# Patient Record
Sex: Male | Born: 1965 | Race: White | Hispanic: No | Smoking: Never smoker
Health system: Southern US, Community
[De-identification: ages and names within clinical notes are randomized; demographics above are authoritative.]

## PROBLEM LIST (undated history)

## (undated) DIAGNOSIS — G931 Anoxic brain damage, not elsewhere classified: Secondary | ICD-10-CM

## (undated) DIAGNOSIS — I219 Acute myocardial infarction, unspecified: Secondary | ICD-10-CM

## (undated) DIAGNOSIS — J690 Pneumonitis due to inhalation of food and vomit: Secondary | ICD-10-CM

## (undated) DIAGNOSIS — E271 Primary adrenocortical insufficiency: Secondary | ICD-10-CM

## (undated) DIAGNOSIS — F070 Personality change due to known physiological condition: Secondary | ICD-10-CM

## (undated) DIAGNOSIS — I4891 Unspecified atrial fibrillation: Secondary | ICD-10-CM

## (undated) DIAGNOSIS — E785 Hyperlipidemia, unspecified: Secondary | ICD-10-CM

## (undated) DIAGNOSIS — Z87440 Personal history of urinary (tract) infections: Secondary | ICD-10-CM

## (undated) DIAGNOSIS — IMO0001 Reserved for inherently not codable concepts without codable children: Secondary | ICD-10-CM

## (undated) DIAGNOSIS — F0390 Unspecified dementia without behavioral disturbance: Secondary | ICD-10-CM

## (undated) DIAGNOSIS — G40909 Epilepsy, unspecified, not intractable, without status epilepticus: Secondary | ICD-10-CM

## (undated) DIAGNOSIS — G8250 Quadriplegia, unspecified: Secondary | ICD-10-CM

## (undated) DIAGNOSIS — E039 Hypothyroidism, unspecified: Secondary | ICD-10-CM

## (undated) DIAGNOSIS — K668 Other specified disorders of peritoneum: Secondary | ICD-10-CM

## (undated) DIAGNOSIS — G9340 Encephalopathy, unspecified: Secondary | ICD-10-CM

## (undated) DIAGNOSIS — R29898 Other symptoms and signs involving the musculoskeletal system: Secondary | ICD-10-CM

## (undated) DIAGNOSIS — K5909 Other constipation: Secondary | ICD-10-CM

## (undated) DIAGNOSIS — R269 Unspecified abnormalities of gait and mobility: Secondary | ICD-10-CM

## (undated) DIAGNOSIS — F419 Anxiety disorder, unspecified: Secondary | ICD-10-CM

## (undated) DIAGNOSIS — E2749 Other adrenocortical insufficiency: Secondary | ICD-10-CM

## (undated) DIAGNOSIS — K219 Gastro-esophageal reflux disease without esophagitis: Secondary | ICD-10-CM

## (undated) DIAGNOSIS — F99 Mental disorder, not otherwise specified: Secondary | ICD-10-CM

## (undated) DIAGNOSIS — I252 Old myocardial infarction: Secondary | ICD-10-CM

## (undated) HISTORY — DX: Other specified disorders of peritoneum: K66.8

## (undated) HISTORY — DX: Unspecified abnormalities of gait and mobility: R26.9

## (undated) HISTORY — DX: Personality change due to known physiological condition: F07.0

## (undated) HISTORY — PX: PEG TUBE PLACEMENT: SUR1034

## (undated) HISTORY — DX: Old myocardial infarction: I25.2

## (undated) HISTORY — DX: Other constipation: K59.09

## (undated) HISTORY — PX: NEPHRECTOMY: SHX65

## (undated) HISTORY — DX: Other symptoms and signs involving the musculoskeletal system: R29.898

---

## 2009-08-16 ENCOUNTER — Emergency Department: Payer: Self-pay | Admitting: Emergency Medicine

## 2009-08-31 ENCOUNTER — Inpatient Hospital Stay: Payer: Self-pay | Admitting: Internal Medicine

## 2009-09-18 IMAGING — CR DG CHEST 1V PORT
1 series · 1 of 1 positions shown · non-contrast
Comparison: none

REASON FOR EXAM: trach out
COMMENTS:

[view not recorded]
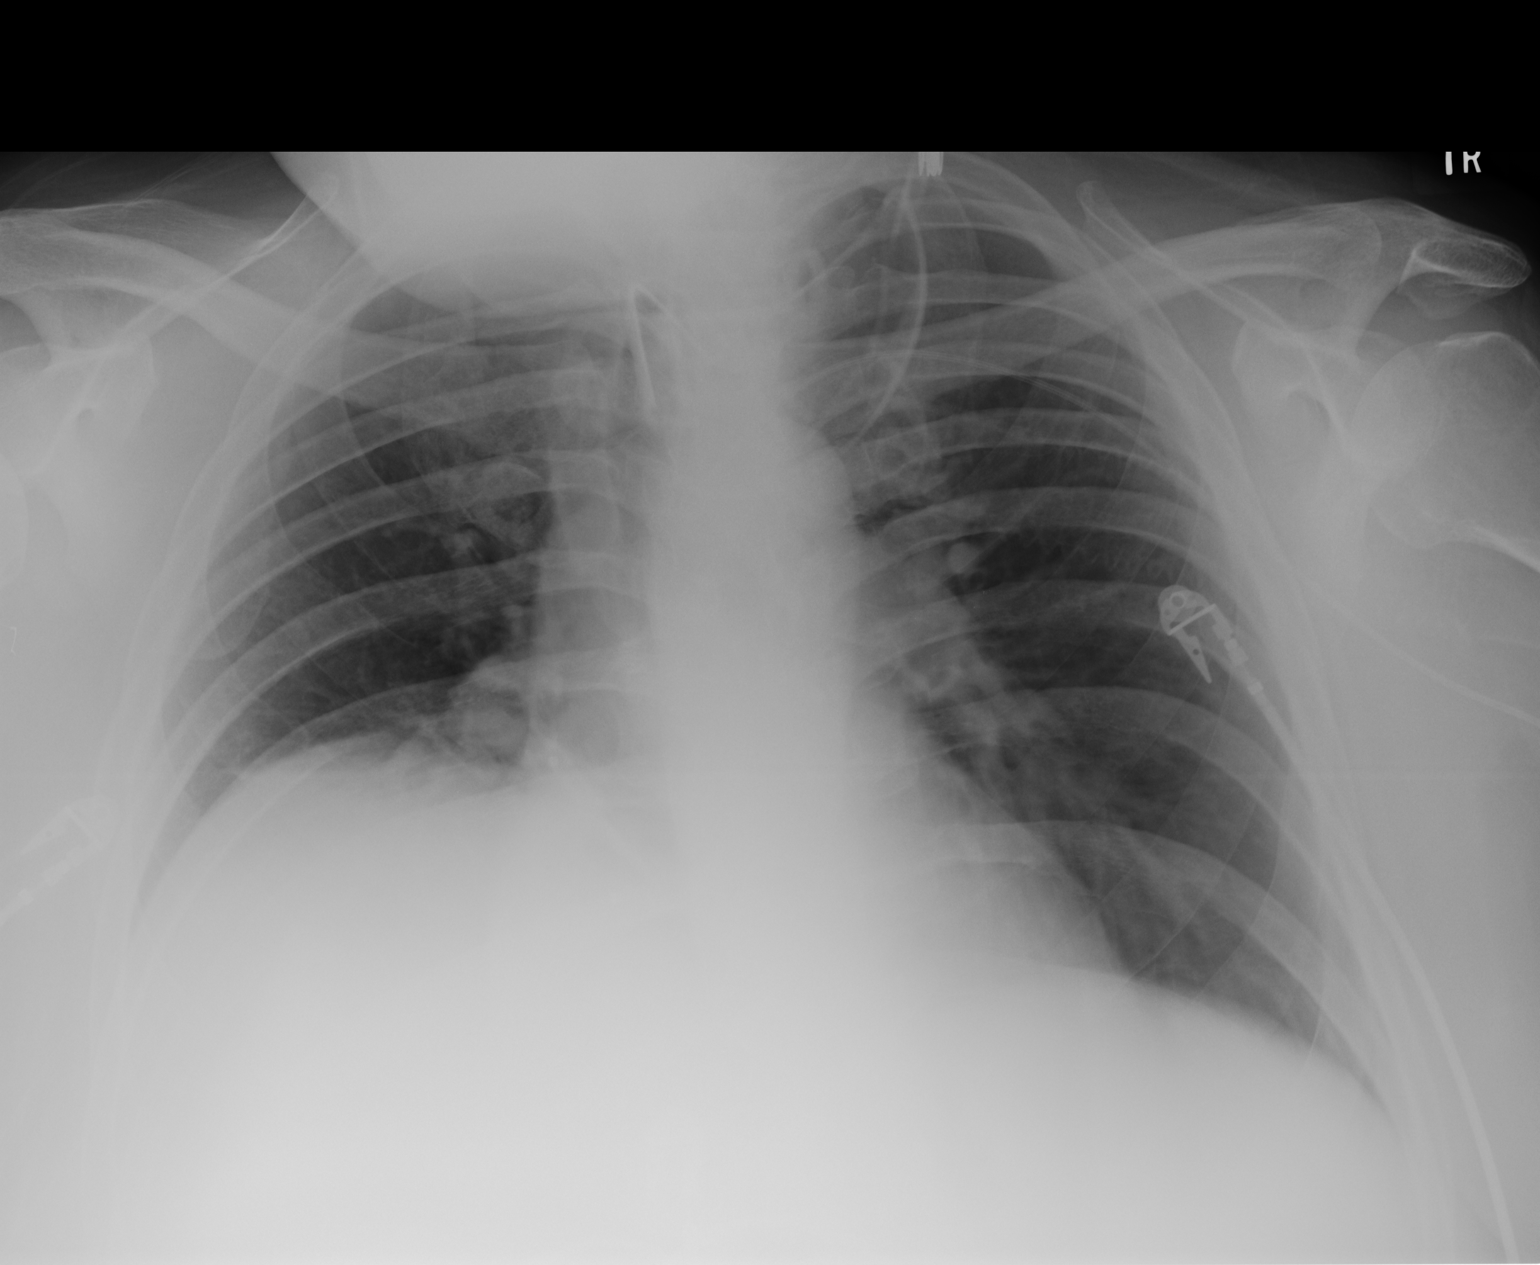

[1 of 1 positions shown; findings below may reference images not displayed]

PROCEDURE:     DXR - DXR PORTABLE CHEST SINGLE VIEW  - August 16, 2009  [DATE]

RESULT:     The left lung is reasonably well inflated and grossly clear. On
the right the hemidiaphragm appears elevated in results in volume loss. The
cardiac silhouette is not enlarged. The pulmonary vascularity is not
engorged. The tracheostomy is not well demonstrated.
IMPRESSION: I see no focal pneumonia nor definite atelectasis. The
right hemidiaphragm appears elevated. The left lung appears clear.

## 2009-10-03 IMAGING — CT CT HEAD WITHOUT CONTRAST
2 series · 16 of 30 positions shown, 20 images · non-contrast
Comparison: none

REASON FOR EXAM: altered mentlal  status  , pt on coumadin
COMMENTS:

PROCEDURE:     CT  - CT HEAD WITHOUT CONTRAST  - August 31, 2009 [DATE]
RESULT:     Comparison:  None
TECHNIQUE: Multiple axial images from the foramen magnum to the vertex were
obtained without IV contrast.

[Series 2: without · axial · non-contrast · 0.48mm/px · z∈[+192,+337]mm · 13 of 35 slices shown, 17 images]
[im 3/35  brain]
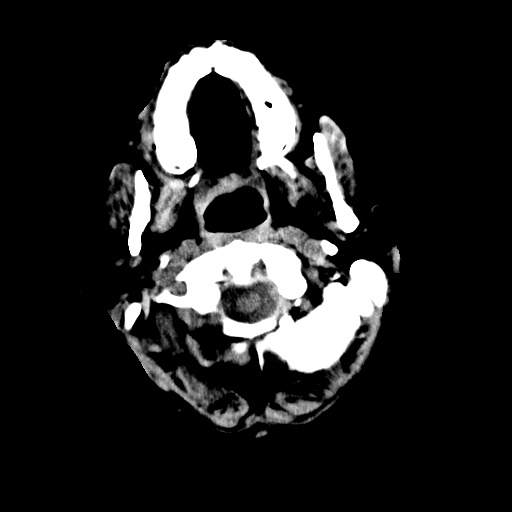
[im 3/35  bone]
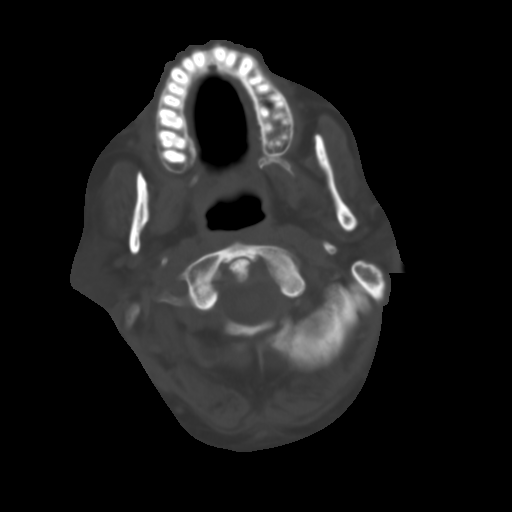
[im 5/35  brain]
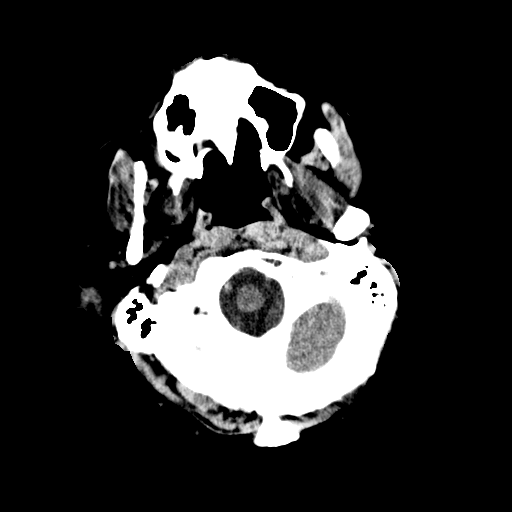
[im 8/35  brain]
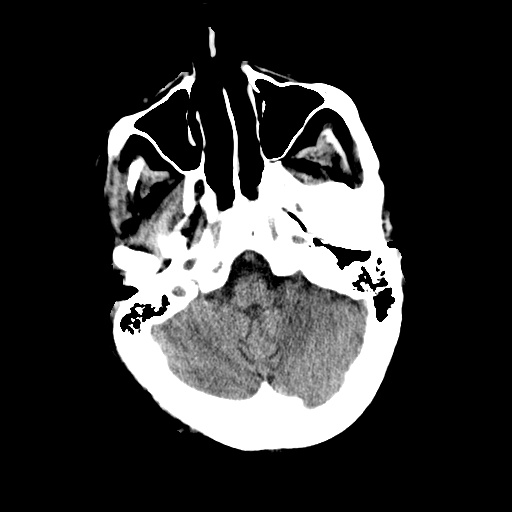
[im 10/35  brain]
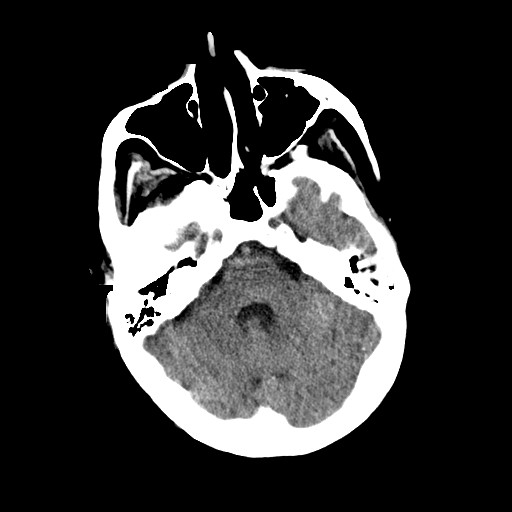
[im 13/35  brain]
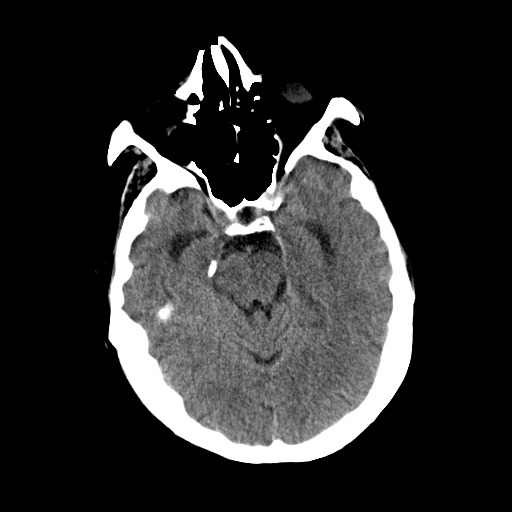
[im 13/35  bone]
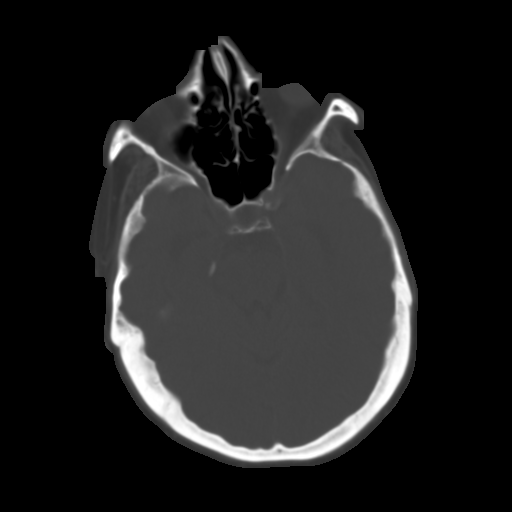
[im 15/35  brain]
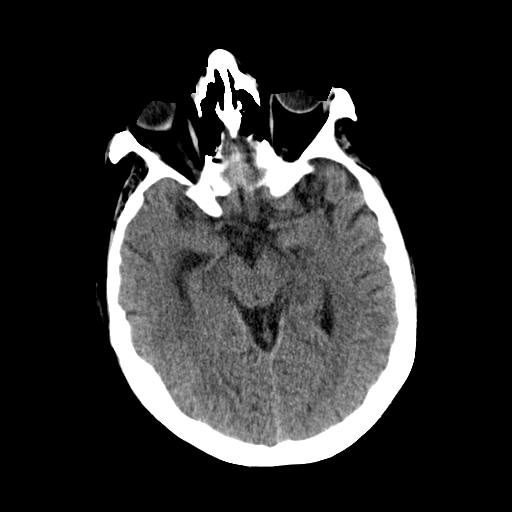
[im 18/35  brain]
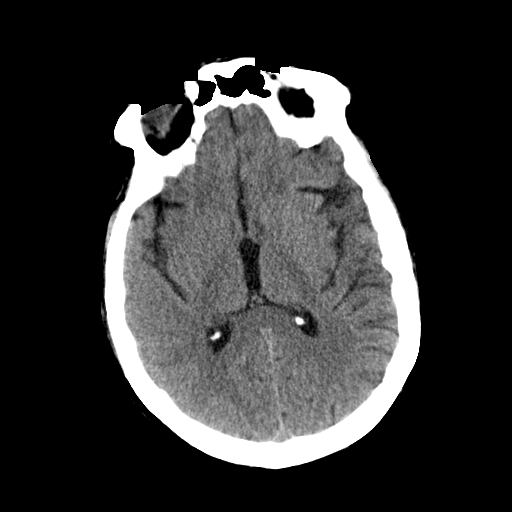
[im 20/35  brain]
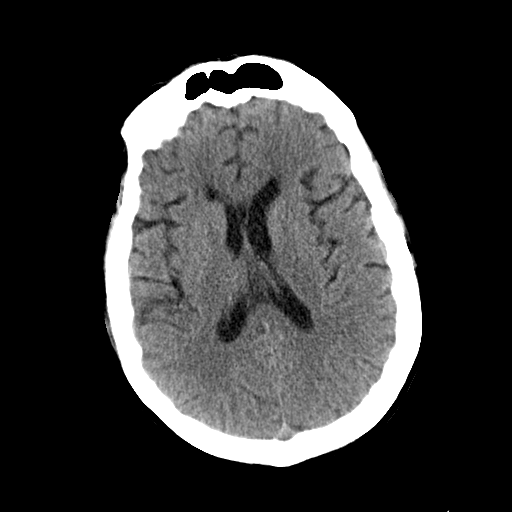
[im 22/35  brain]
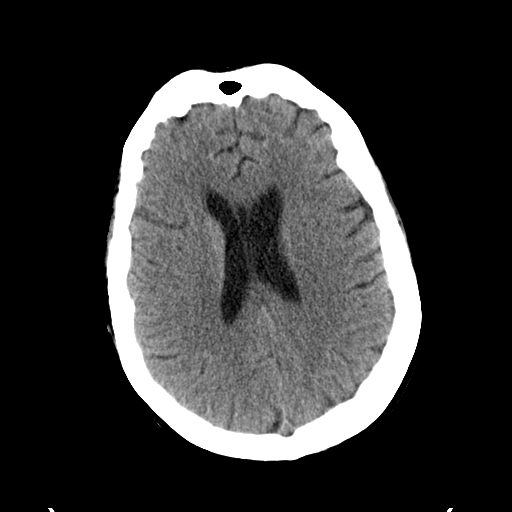
[im 22/35  bone]
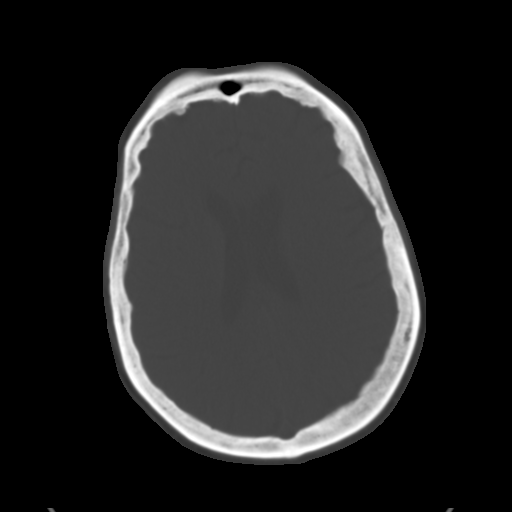
[im 25/35  brain]
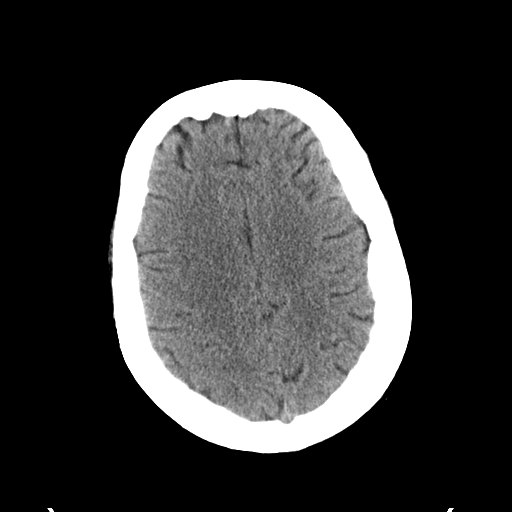
[im 27/35  brain]
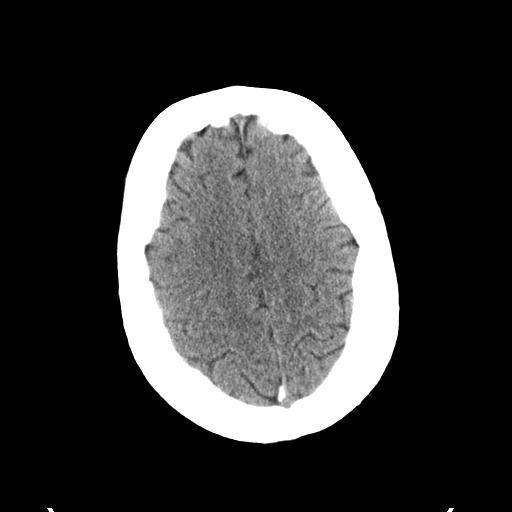
[im 30/35  brain]
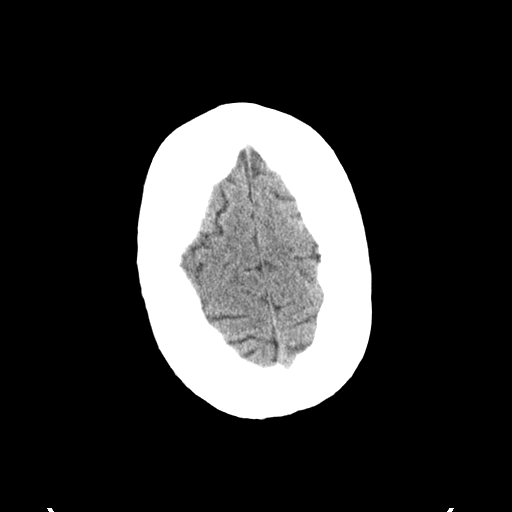
[im 32/35  brain]
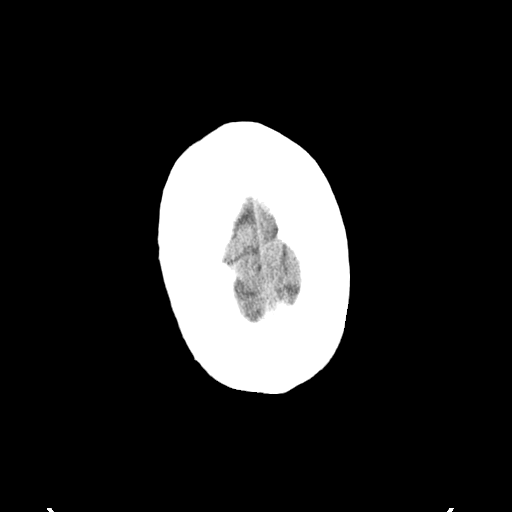
[im 32/35  bone]
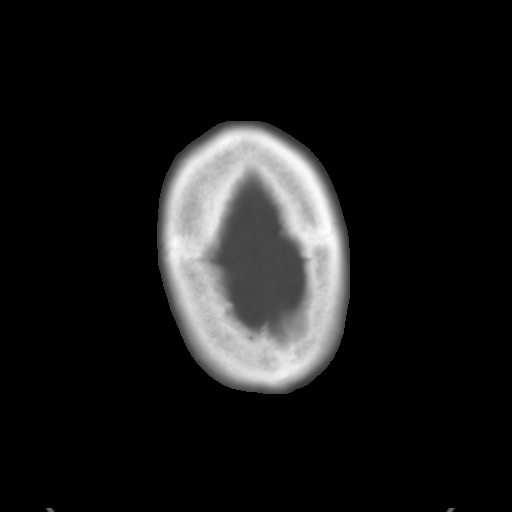

[Series 3: bone · axial · 0.48mm/px · z∈[+192,+242]mm · 3 of 35 slices shown]
[im 3/35  bone]
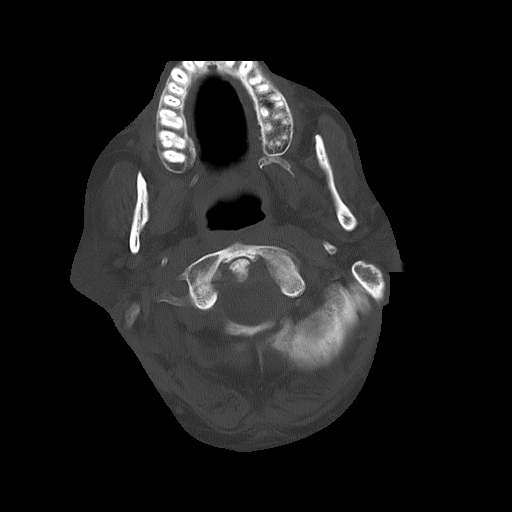
[im 8/35  bone]
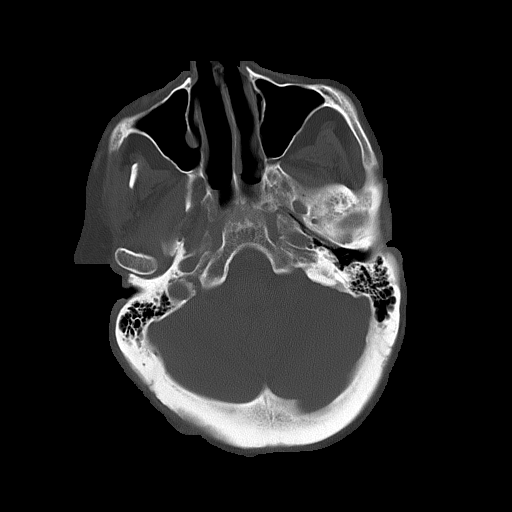
[im 13/35  bone]
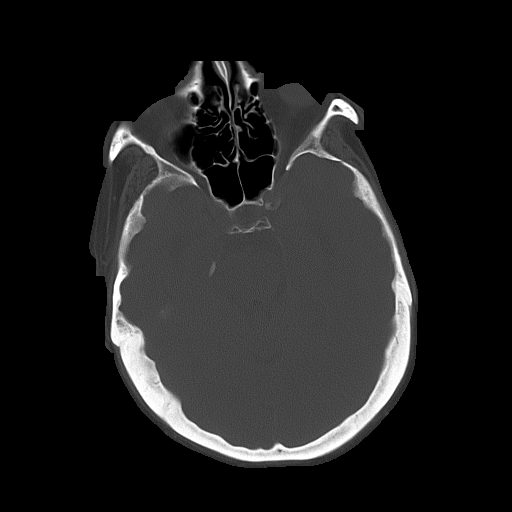

[16 of 30 positions shown; findings below may reference images not displayed]

FINDINGS: There is no evidence of mass effect, midline shift, or extra-axial fluid
collections.  There is no evidence of a space-occupying lesion or
intracranial hemorrhage. There is no evidence of a cortical-based area of
acute infarction.

The ventricles and sulci are appropriate for the patient's age. The basal
cisterns are patent.

Visualized portions of the orbits are unremarkable. The visualized portions
of the paranasal sinuses and mastoid air cells are unremarkable.

The osseous structures are unremarkable.
IMPRESSION: No acute intracranial process.

## 2009-10-03 IMAGING — CR DG CHEST 1V PORT
1 series · 1 of 1 positions shown · non-contrast
Comparison: none

REASON FOR EXAM: fever
COMMENTS:

PROCEDURE:     DXR - DXR PORTABLE CHEST SINGLE VIEW  - August 31, 2009  [DATE]
RESULT:     Comparison: 08/16/2009

[view not recorded]
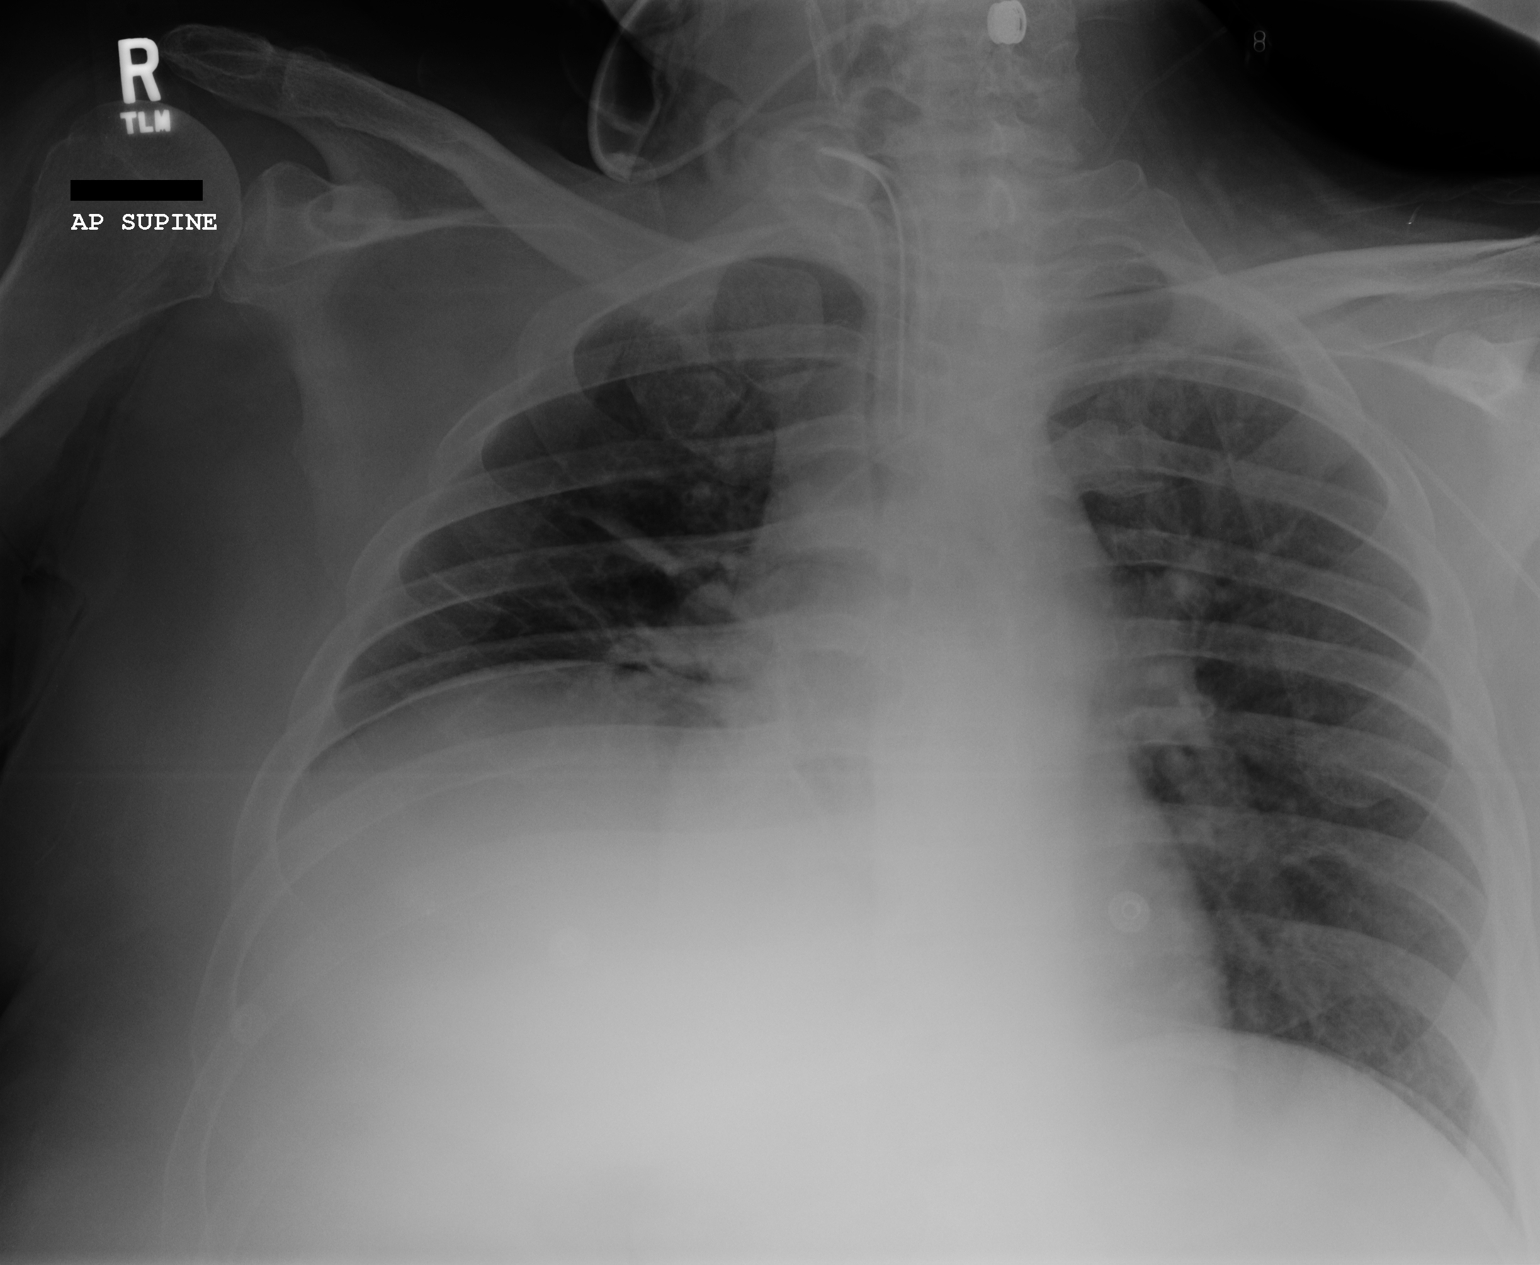

[1 of 1 positions shown; findings below may reference images not displayed]

FINDINGS: Single portable AP chest radiograph is provided. There is elevation of the
right hemidiaphragm. There is a tracheostomy tube in place. There is no
focal parenchymal opacity, pleural effusion, or pneumothorax. Normal
cardiomediastinal silhouette. The osseous structures are unremarkable.
IMPRESSION: No acute disease of the chest.

Persistent elevated right hemidiaphragm.

## 2009-10-06 IMAGING — CR DG CHEST 1V PORT
1 series · 1 of 1 positions shown · non-contrast
Comparison: none

REASON FOR EXAM: positive sputum culture
COMMENTS:

[view not recorded]
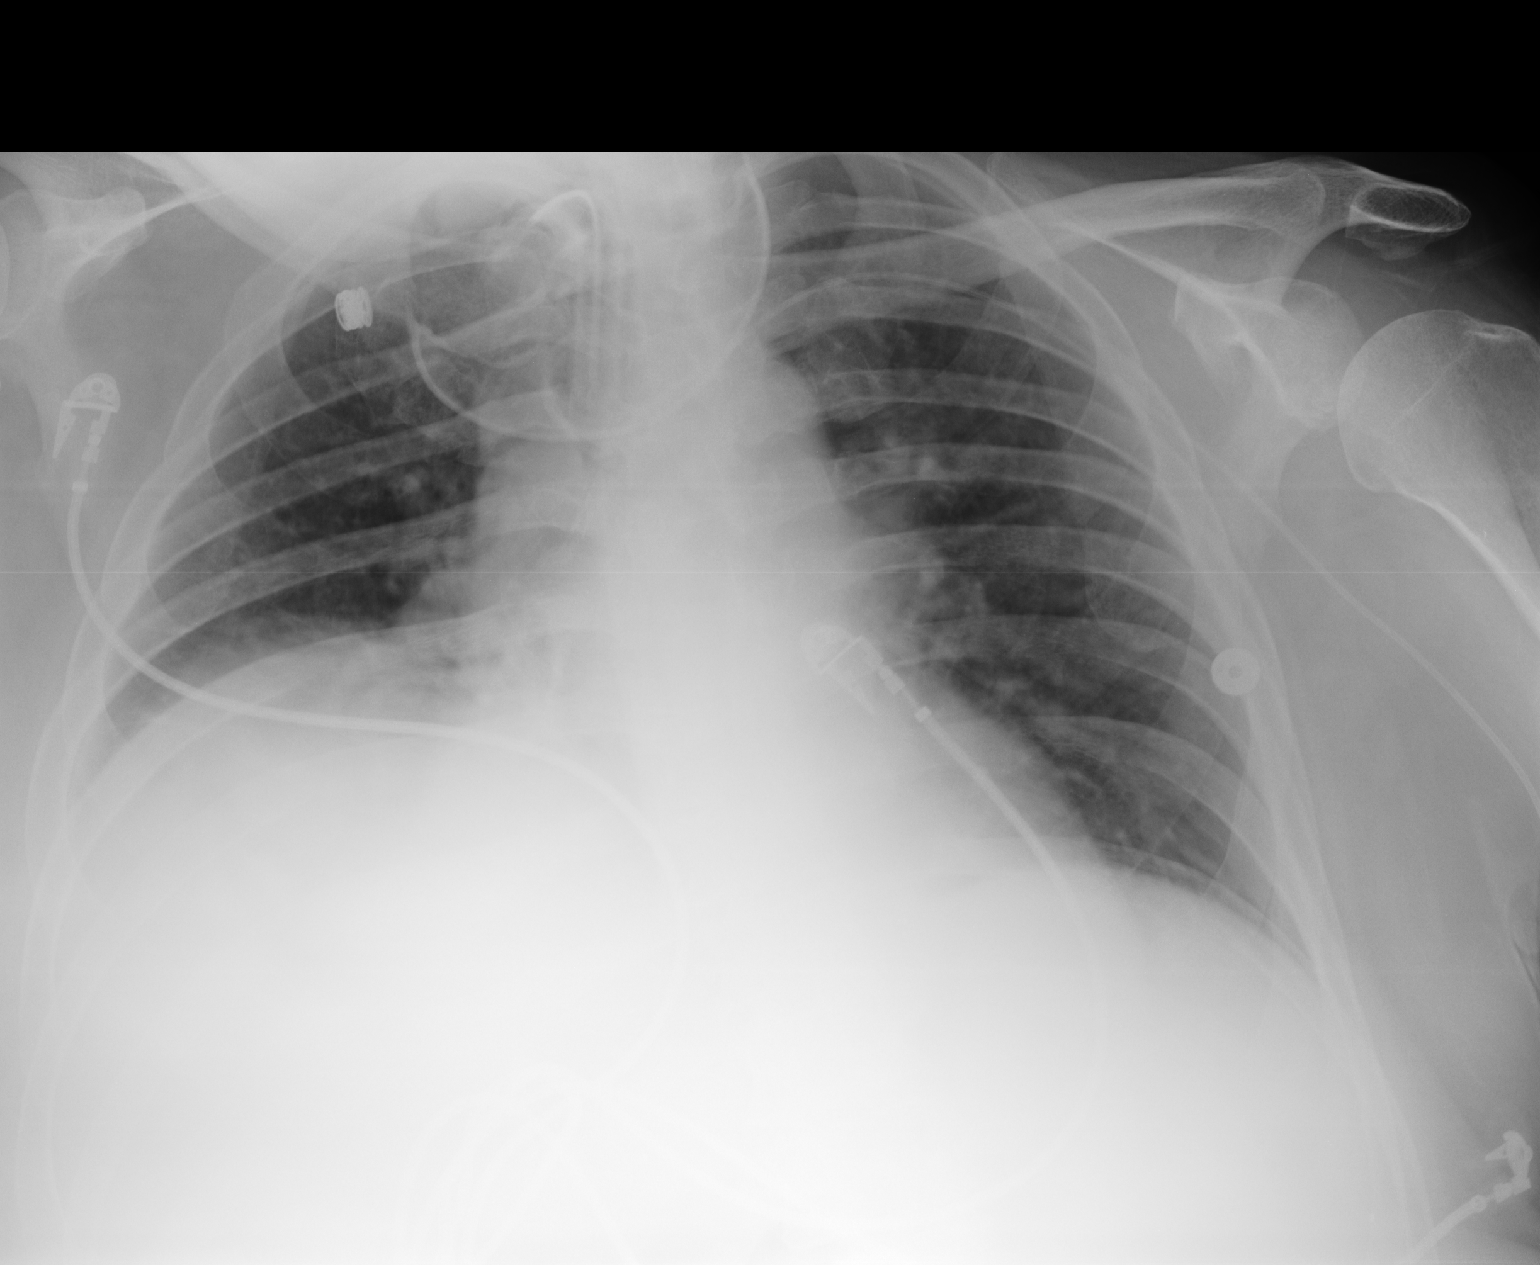

[1 of 1 positions shown; findings below may reference images not displayed]

PROCEDURE:     DXR - DXR PORTABLE CHEST SINGLE VIEW  - September 03, 2009  [DATE]

RESULT:     Comparison is made to a prior study of 08/31/2009. Elevation of
the right hemidiaphragm is again noted. Endotracheal tube is noted in good
anatomic position. Mild atelectasis and/or infiltrate of the right lung base
on today's exam. The left lung is clear. The cardiovascular structures are
unremarkable.
IMPRESSION: 1.     Mild atelectasis and/or infiltrate right lung base.
2.     Persistent elevation of the right hemidiaphragm.
3.     Endotracheal tube in good position.

## 2009-10-09 IMAGING — CR DG CHEST 1V PORT
1 series · 1 of 1 positions shown · non-contrast
Comparison: none

REASON FOR EXAM: coughing more frequent, bloody sputum
COMMENTS:

[view not recorded]
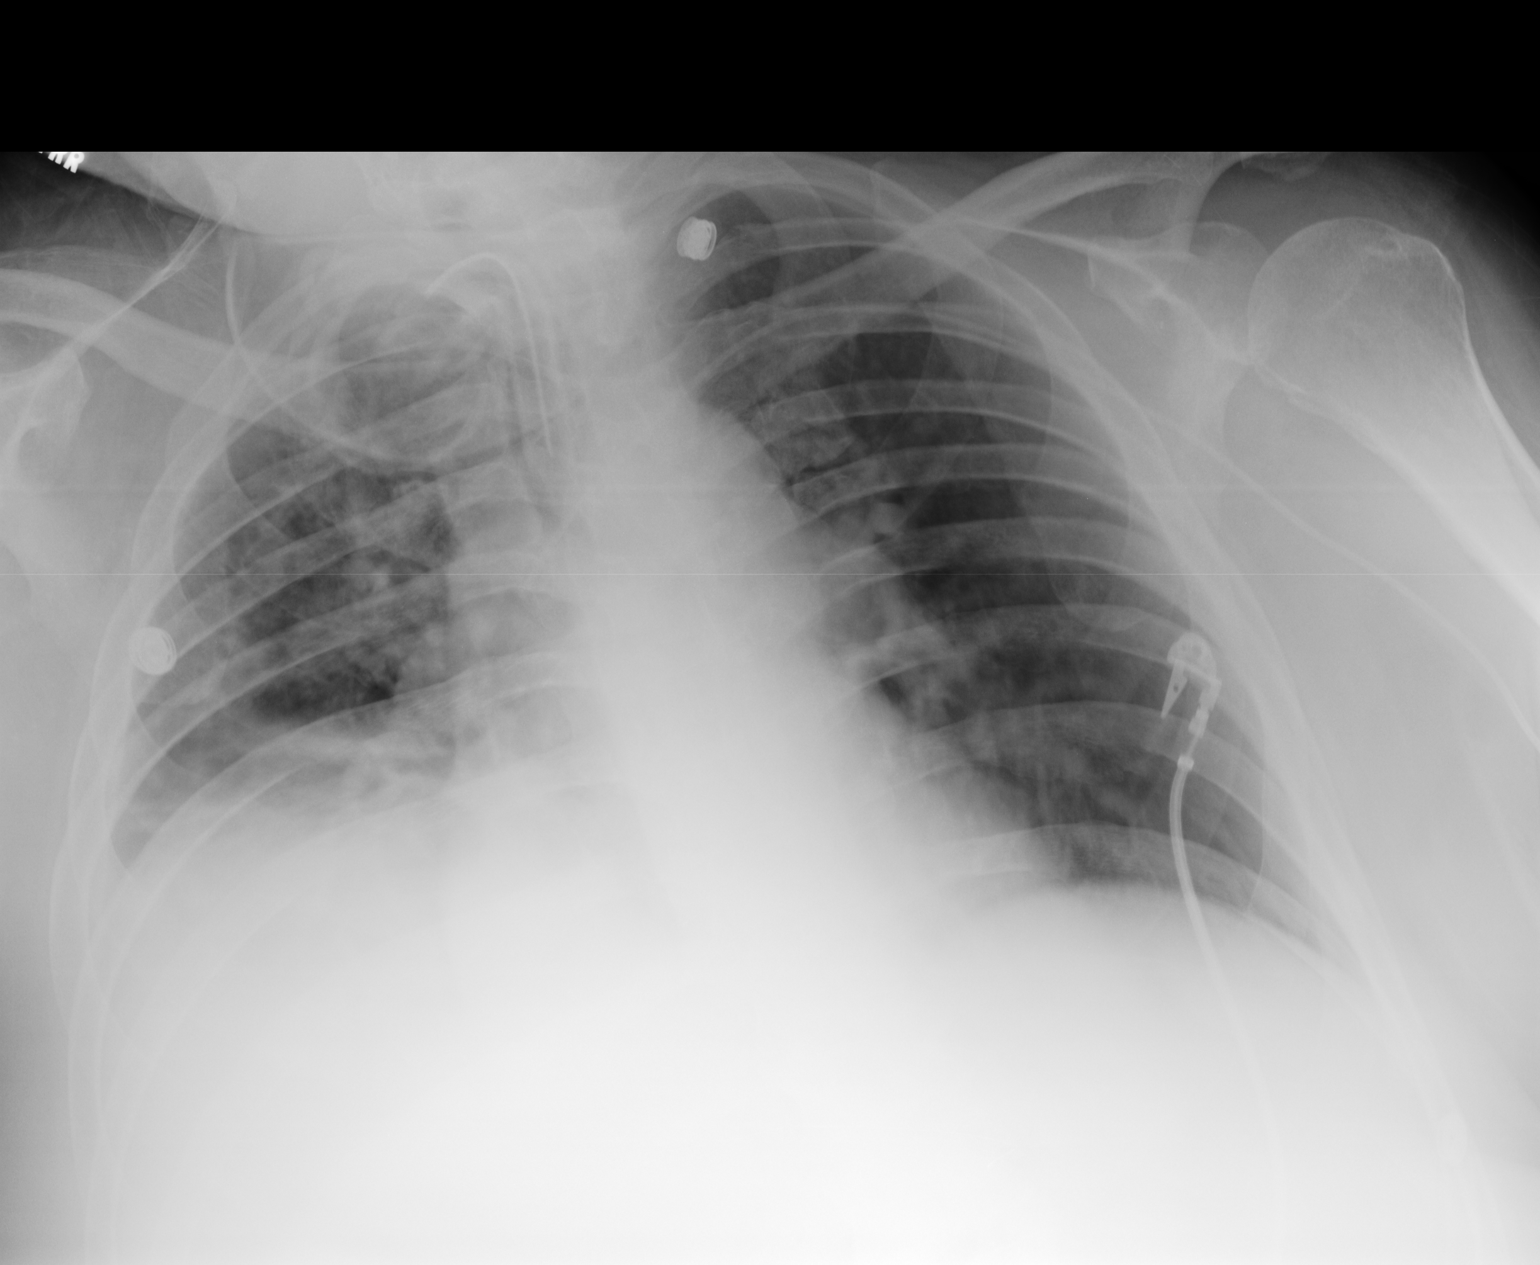

[1 of 1 positions shown; findings below may reference images not displayed]

PROCEDURE:     DXR - DXR PORTABLE CHEST SINGLE VIEW  - September 06, 2009  [DATE]

RESULT:     Comparison is made to the study 03 September, 2009.

The right lung remains hypoinflated. The interstitial markings on the right
are more conspicuous than previously. The tracheostomy appliance appears
unchanged. The left lung is adequately inflated and grossly clear.
IMPRESSION: There may be worsening atelectasis or developing infiltrate
on the right. I do not see evidence of focal infiltrate on the left. A
followup PA and lateral chest x-ray would be useful when the patient can
tolerate the procedure. Chest CT scanning may be ultimately indicated.

## 2009-10-10 IMAGING — CR DG ABDOMEN 1V
1 series · 2 of 2 positions shown · non-contrast
Comparison: none

REASON FOR EXAM: Decreased BS with [employer] residuals
COMMENTS:

[Series 1: view not recorded · 0.17mm/px · 2 of 2 slices shown]
[im 1/2]
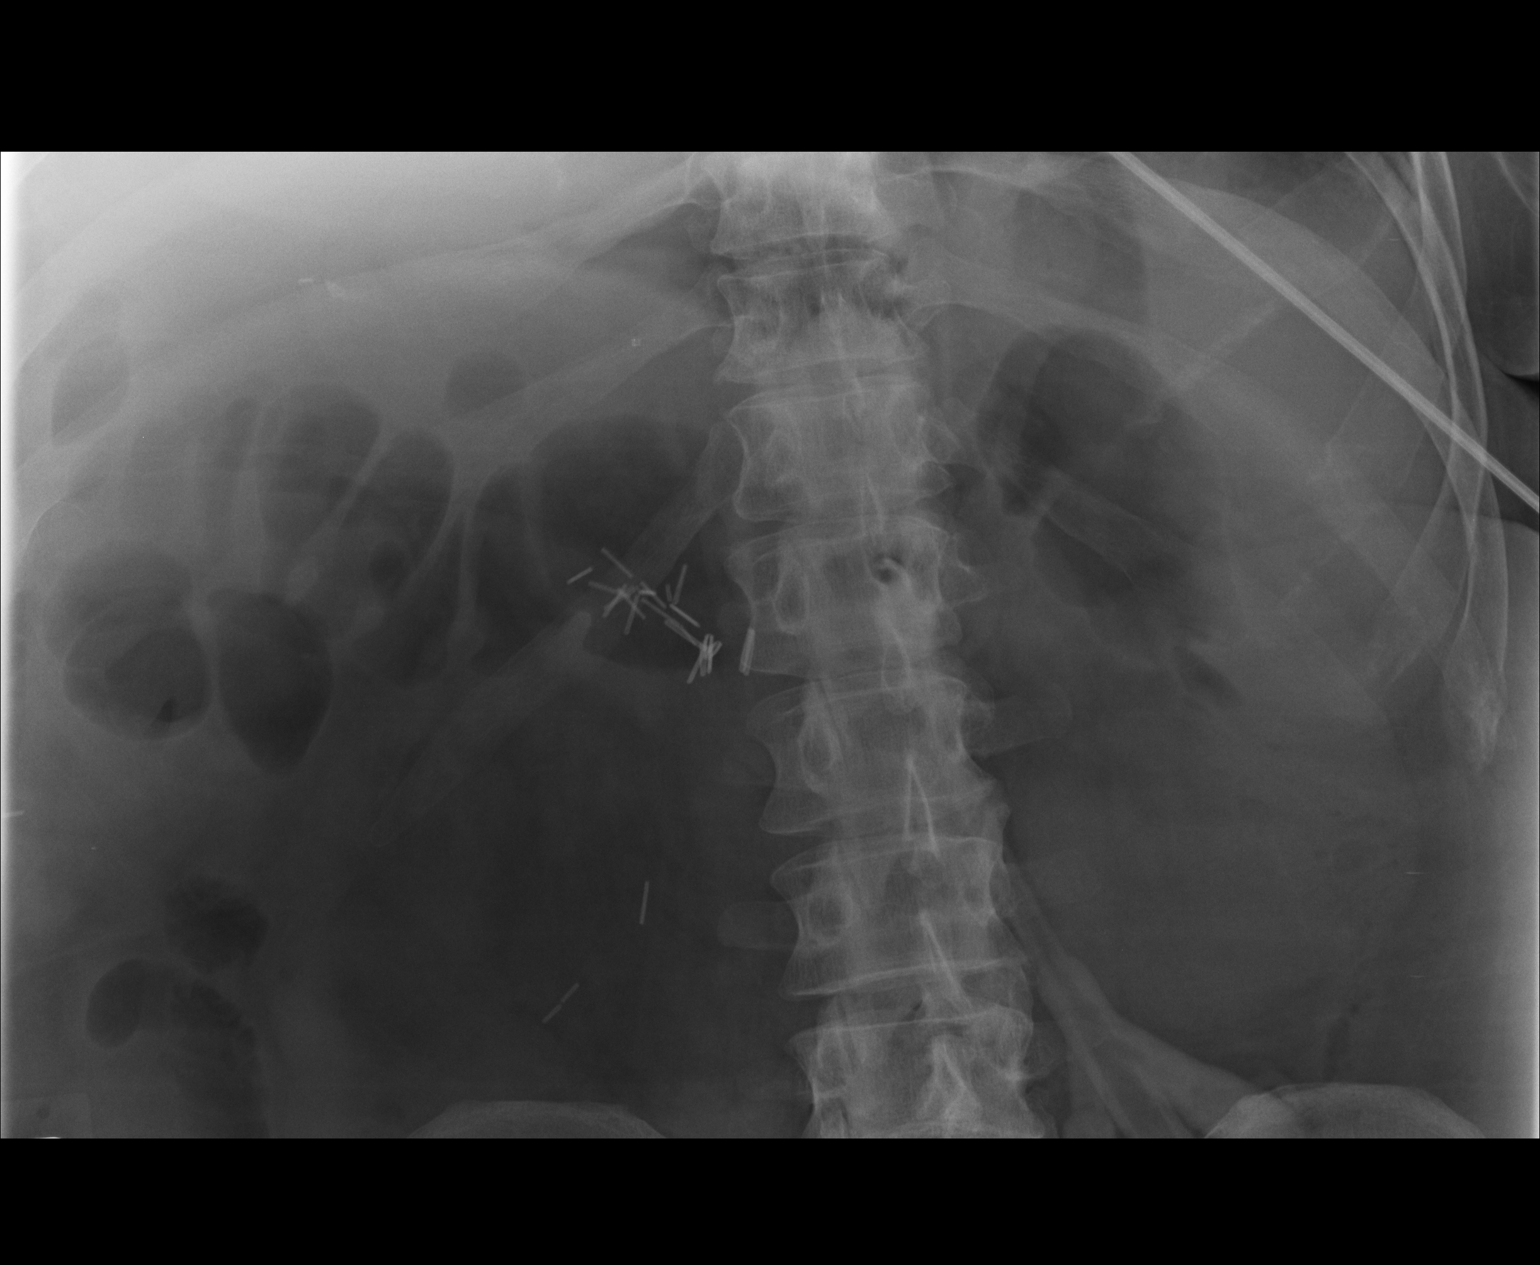
[im 2/2]
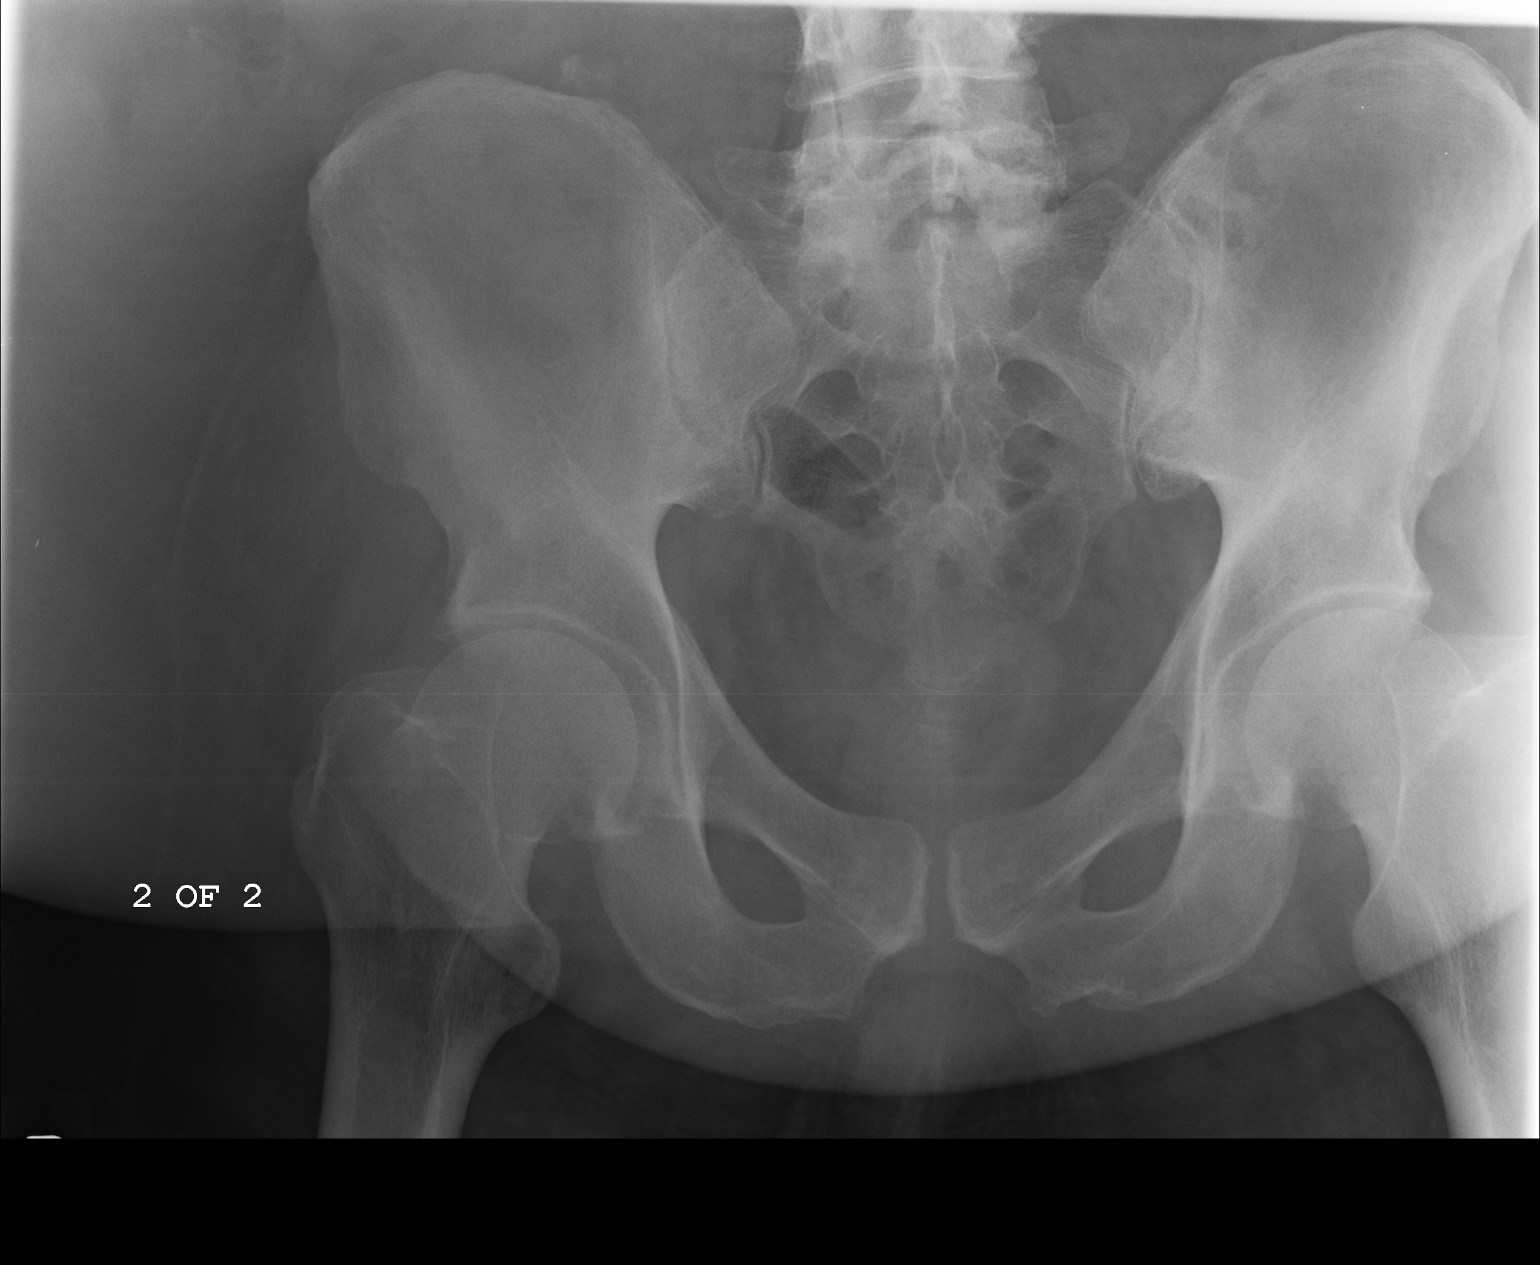

[2 of 2 positions shown; findings below may reference images not displayed]

PROCEDURE:     DXR - DXR KIDNEY URETER BLADDER  - September 07, 2009 [DATE]

RESULT:     Two supine views of the abdomen are submitted for complete
coverage. There is a nonspecific bowel gas pattern. I do not see objective
evidence of obstruction nor perforation. There are surgical clips in the
gallbladder fossa. The lumbar spine exhibits mild degenerative change. The
bony pelvis is grossly intact. There is subtle radiodense material
projecting just above the right iliac crest on the view of the pelvis and
just below that level on the abdominal image. There is a dual-lumen catheter
type tube present over the left iliac crest on the abdominal film.
IMPRESSION: I do not see definite acute intra-abdominal abnormality on
this limited study. However, followup CT scanning should be considered based
upon the patient's clinical examination.

## 2009-10-12 IMAGING — US US THYROID
1 series · 14 of 14 positions shown · non-contrast
Comparison: none

REASON FOR EXAM: Hyperthyroidism. This would be a diffiult study to
perform due to 1. Pt. Size. 2
COMMENTS:

[Series 1: us thyroid · 14 of 14 slices shown]
[im 1/14]
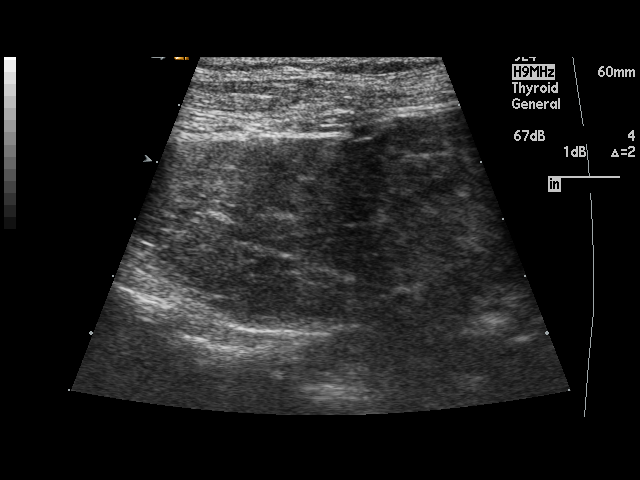
[im 2/14]
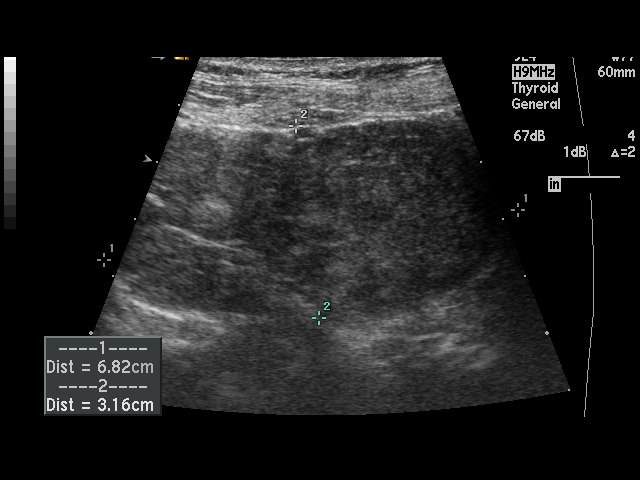
[im 3/14]
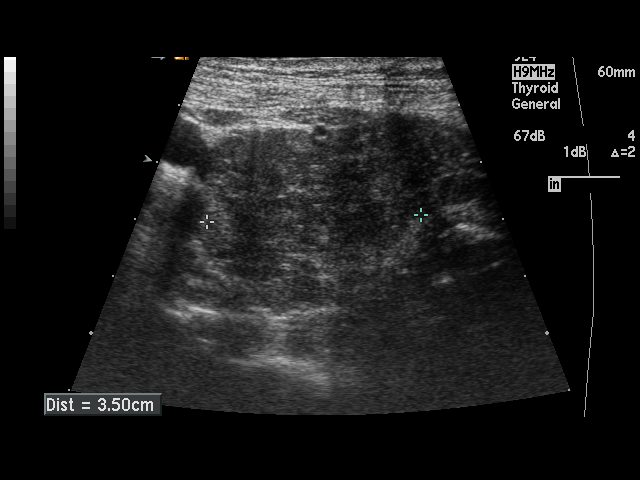
[im 4/14]
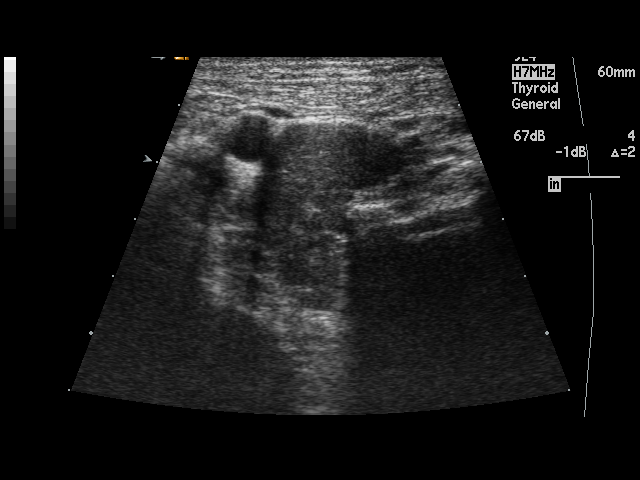
[im 5/14]
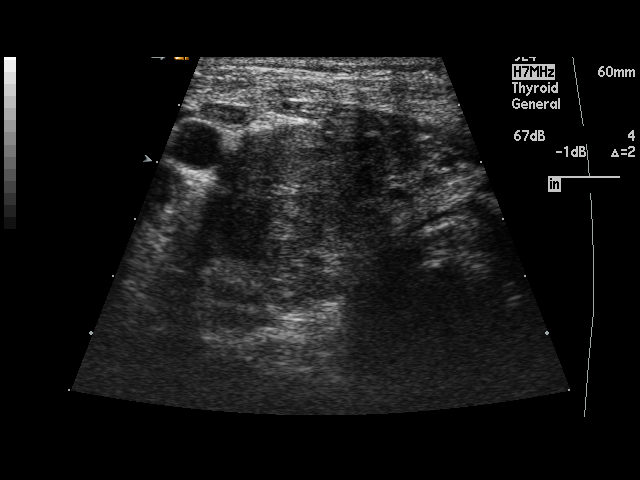
[im 6/14]
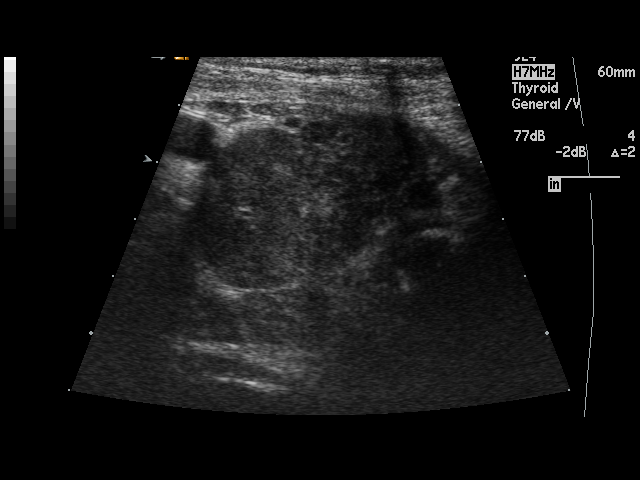
[im 7/14]
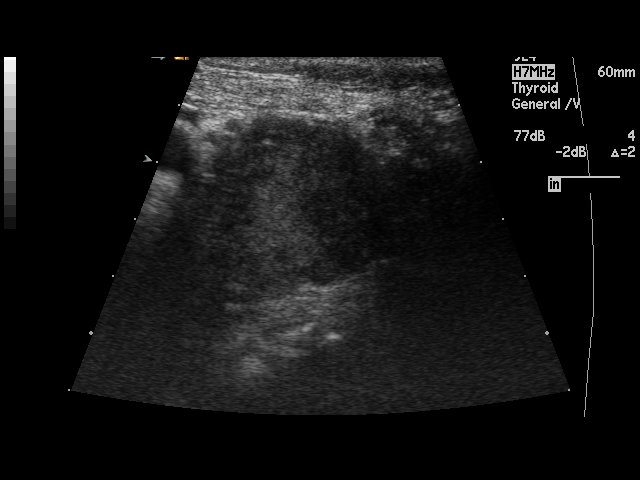
[im 8/14]
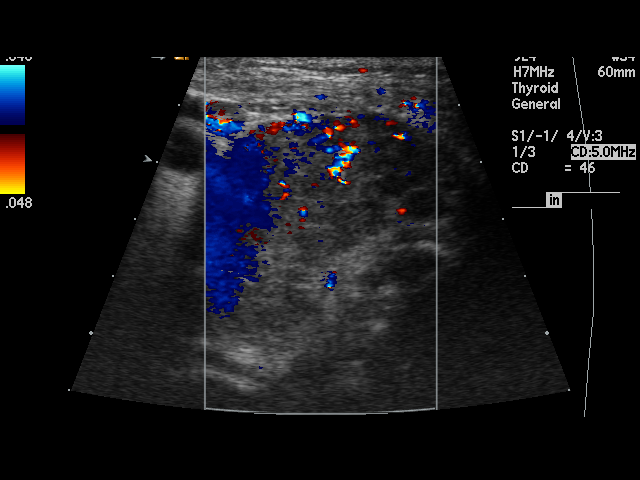
[im 9/14]
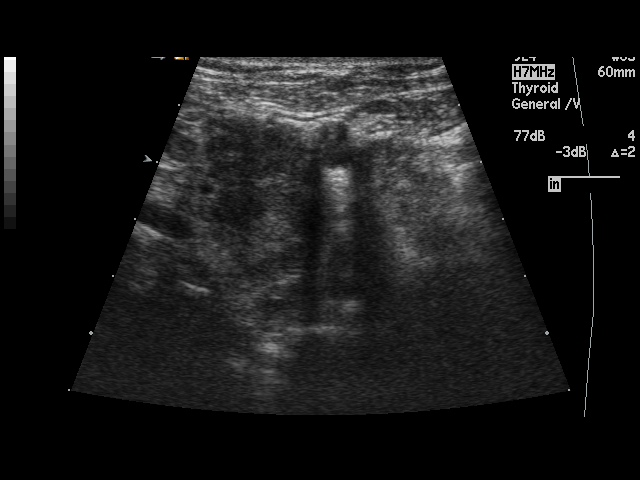
[im 10/14]
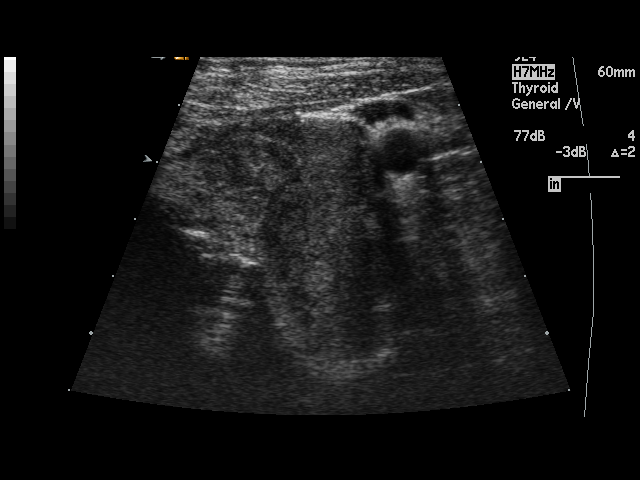
[im 11/14]
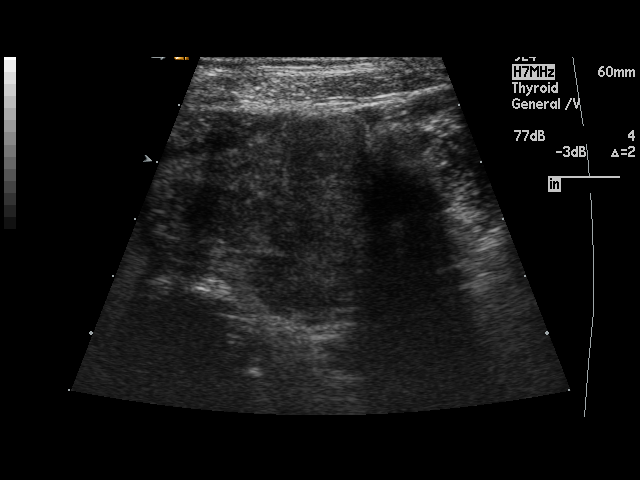
[im 12/14]
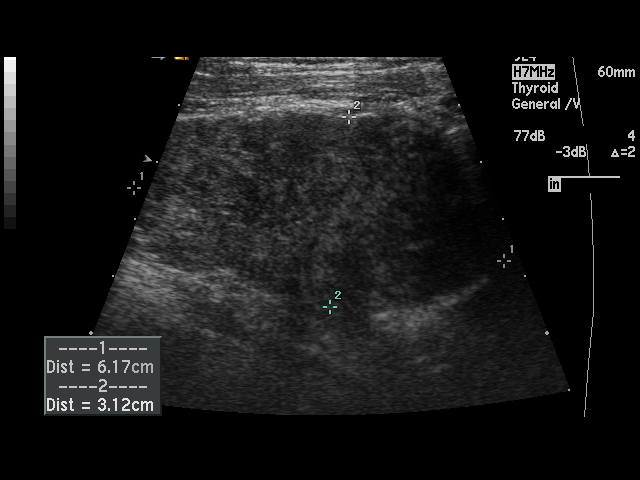
[im 13/14]
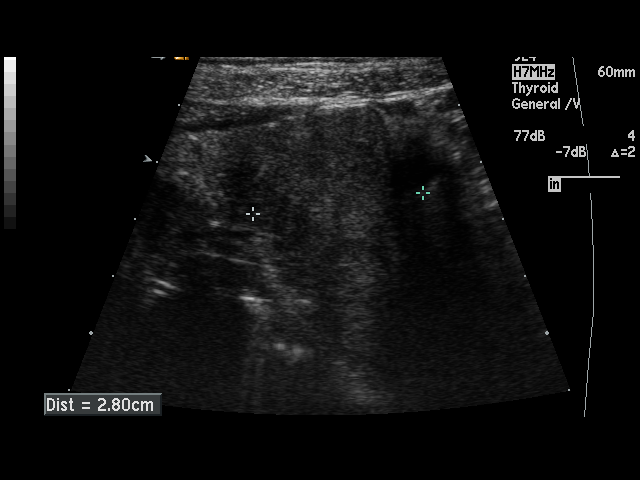
[im 14/14]
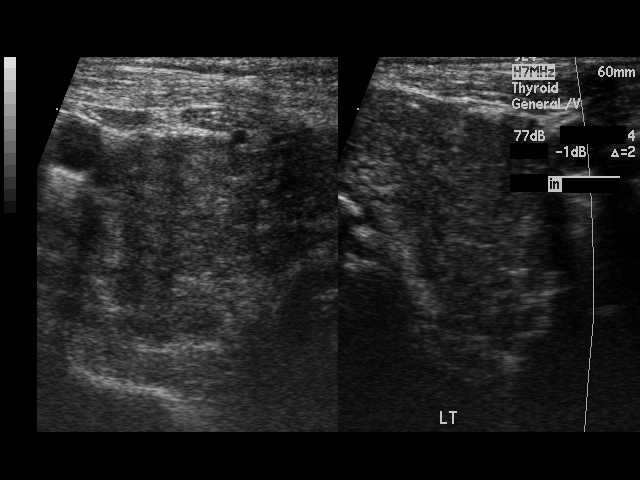

[14 of 14 positions shown; findings below may reference images not displayed]

PROCEDURE:     US  - US THYROID  - September 09, 2009  [DATE]

RESULT:     The right lobe of the thyroid measures 6.8 x 3.6 x 3.5 cm and
the left lobe measures 6.1 x 3.12 x 2.8 cm. The isthmus is not visualized.
The patient has a tracheostomy in place. No definite masses are identified.
The right and left lobes of the thyroid are enlarged demonstrating
heterogeneous sonographic architecture.
IMPRESSION: 1.Diffuse heterogeneous enlargement of the right and left lobes of the
thyroid. No definite focal nodules or masses are identified. This may
represent the sequela of a goiter. Clinical correlation is recommended.

## 2009-11-13 ENCOUNTER — Inpatient Hospital Stay (HOSPITAL_COMMUNITY): Admission: EM | Admit: 2009-11-13 | Discharge: 2009-11-27 | Payer: Self-pay | Admitting: Emergency Medicine

## 2009-11-17 ENCOUNTER — Ambulatory Visit: Payer: Self-pay | Admitting: Internal Medicine

## 2009-11-18 ENCOUNTER — Encounter (INDEPENDENT_AMBULATORY_CARE_PROVIDER_SITE_OTHER): Payer: Self-pay | Admitting: Internal Medicine

## 2009-12-16 IMAGING — CR DG CHEST 1V PORT
1 series · 1 of 1 positions shown · non-contrast
Comparison: None.

CLINICAL DATA: Central line placement.

PORTABLE CHEST - 1 VIEW

[view not recorded]
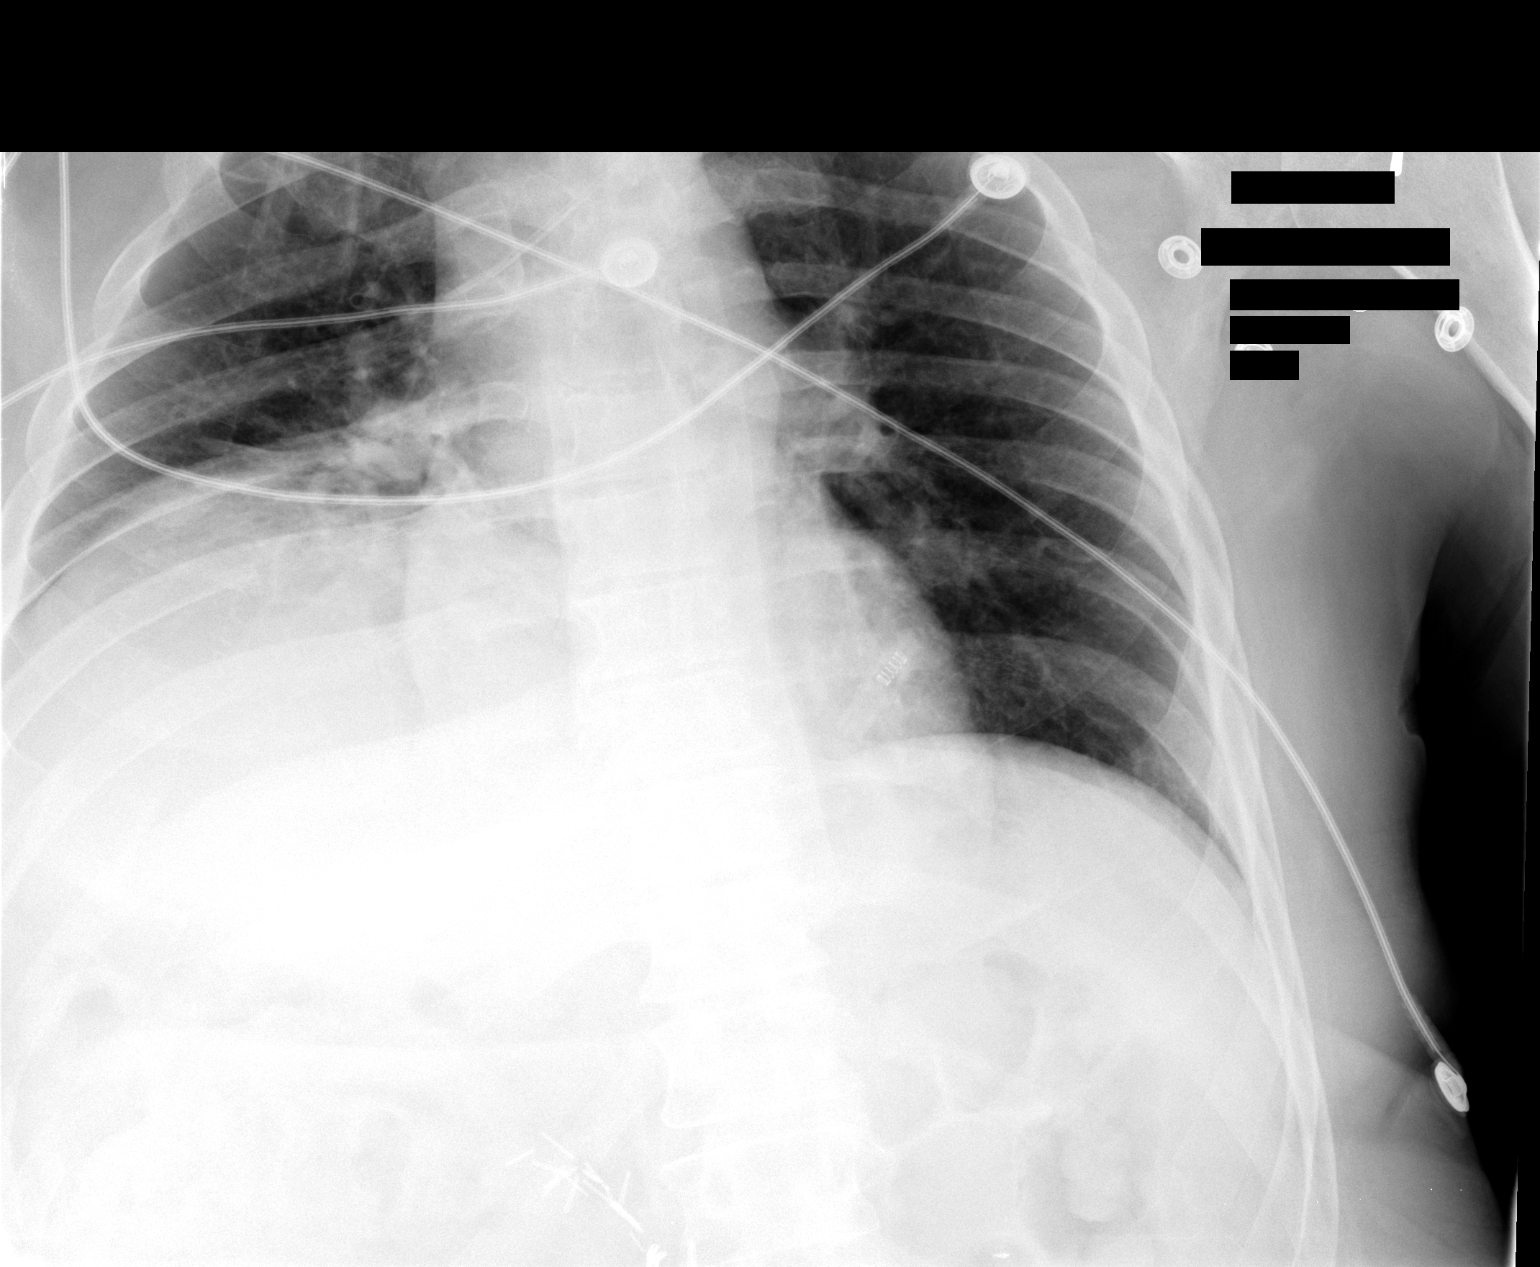

[1 of 1 positions shown; findings below may reference images not displayed]

FINDINGS: Tracheostomy is in good position.  Left jugular catheter
tip is in the SVC.  There is no pneumothorax.

The right hemidiaphragm is elevated with right lower lobe
atelectasis.  The left lung is clear.  There is no edema.
IMPRESSION: Central line placement into the SVC.

Right lower lobe volume loss.

## 2009-12-17 IMAGING — CR DG CHEST 1V PORT
1 series · 1 of 1 positions shown · non-contrast
Comparison: Portable chest x-ray yesterday.

CLINICAL DATA: Sepsis.  Chronic ventilator dependent respiratory
failure.

PORTABLE CHEST - 1 VIEW [DATE]/[id number] hours:

[view not recorded]
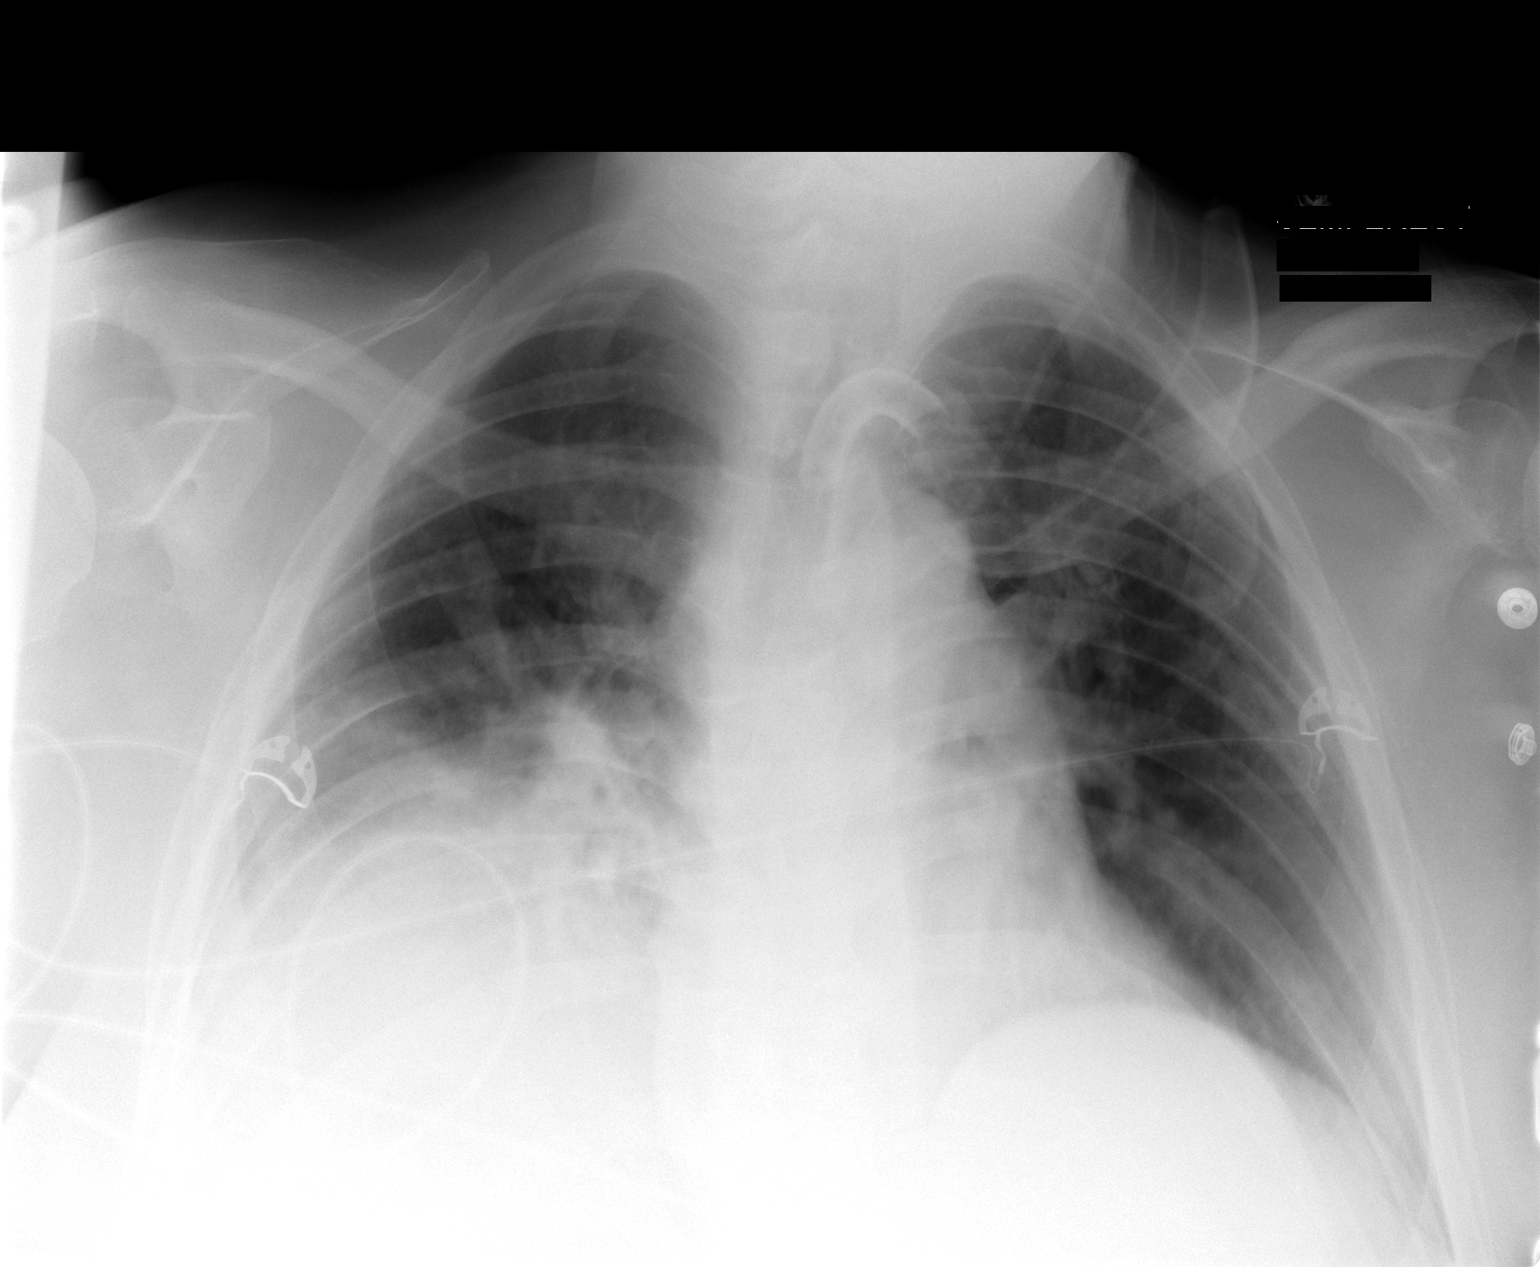

[1 of 1 positions shown; findings below may reference images not displayed]

FINDINGS: Tracheostomy tube tip in satisfactory position below the
thoracic inlet.  Worsening aeration in the right lower lobe, with
complete silhouetting of the right hemidiaphragm currently.  Left
lung remains essentially clear.  Heart size upper normal to mildly
enlarged but stable.  Pulmonary vascularity normal.
IMPRESSION: Dense right lower lobe atelectasis with worsening aeration since
yesterday.

## 2009-12-20 IMAGING — CR DG CHEST 1V PORT
1 series · 1 of 1 positions shown · non-contrast
Comparison: 11/14/2009

CLINICAL DATA: Sepsis.

PORTABLE CHEST - 1 VIEW

[view not recorded]
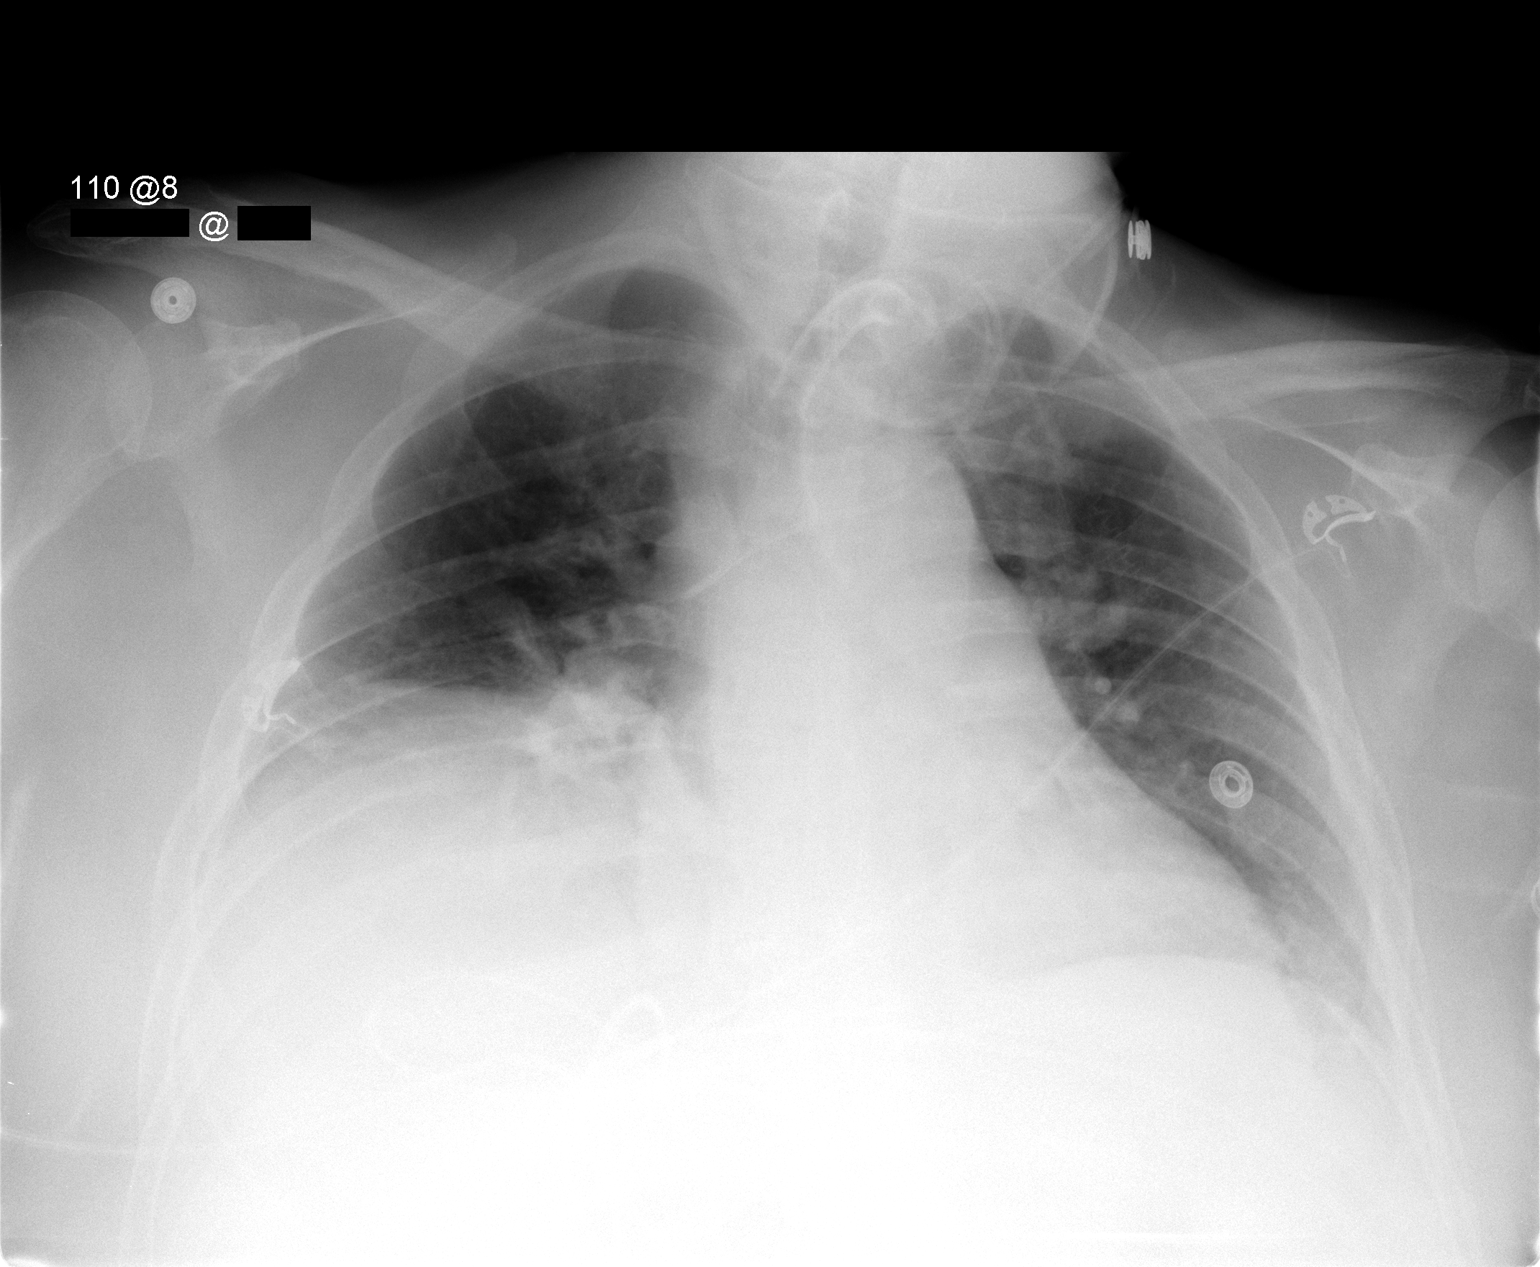

[1 of 1 positions shown; findings below may reference images not displayed]

FINDINGS: Tracheostomy tube is again noted.
Right basilar atelectasis is unchanged.
The cardiomediastinal silhouette is stable.
A left central venous catheter is unchanged.
There is no evidence of pneumothorax.
IMPRESSION: Stable chest with right basilar atelectasis.

## 2009-12-21 IMAGING — US US ABDOMEN LIMITED
1 series · 11 of 11 positions shown · non-contrast
Comparison: None

CLINICAL DATA: Inflammation around peg tube insertion site.
Evaluate for abscess.

ABDOMEN ULTRASOUND LIMITED
TECHNIQUE: Routine

[Series 1: us abdomen limited · 0.06mm/px · 11 of 11 slices shown]
[im 1/11]
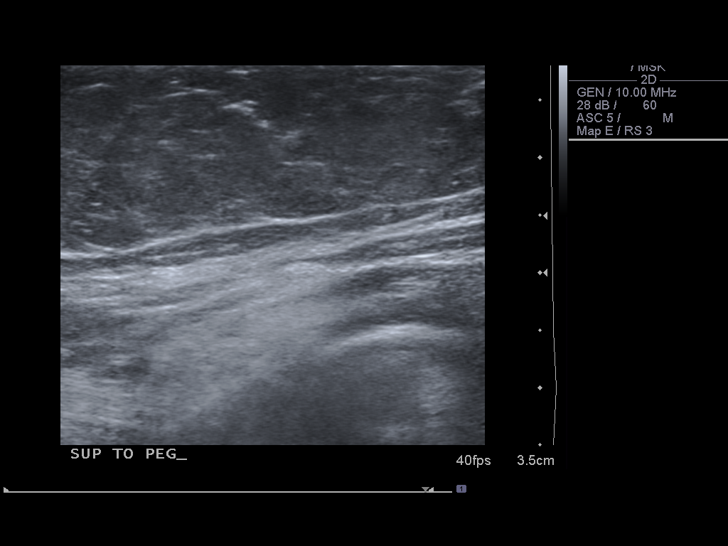
[im 2/11]
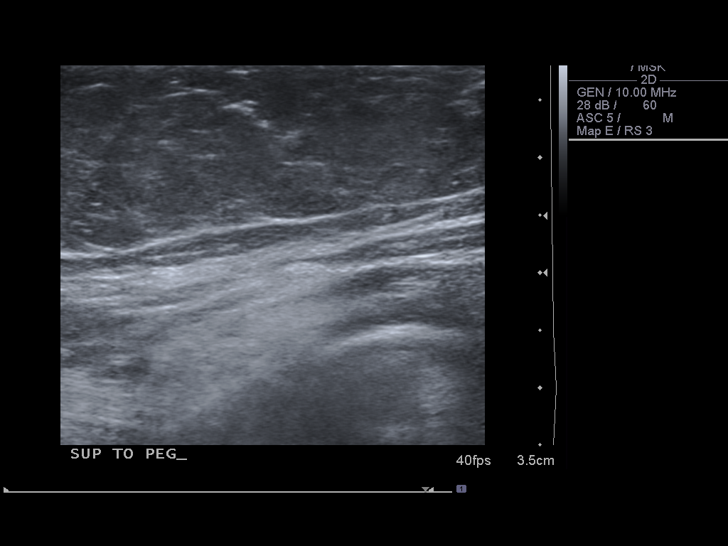
[im 3/11]
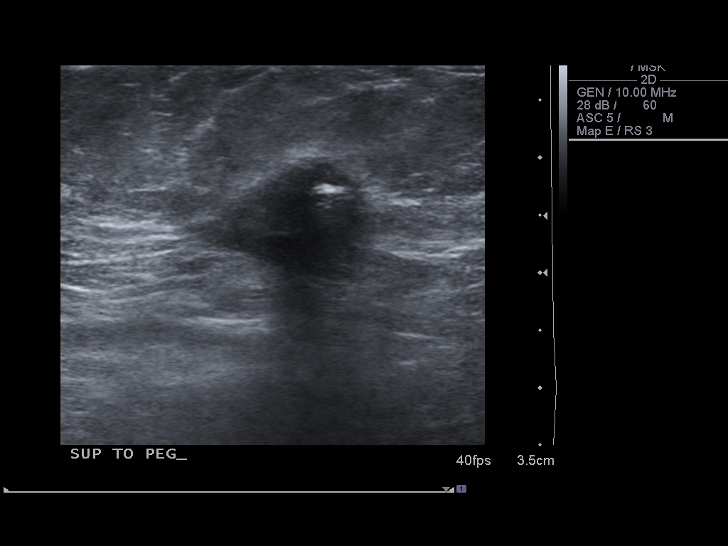
[im 4/11]
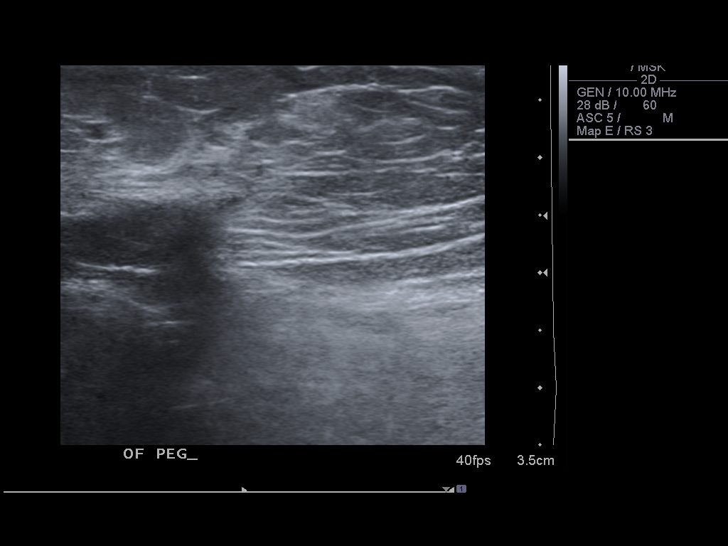
[im 5/11]
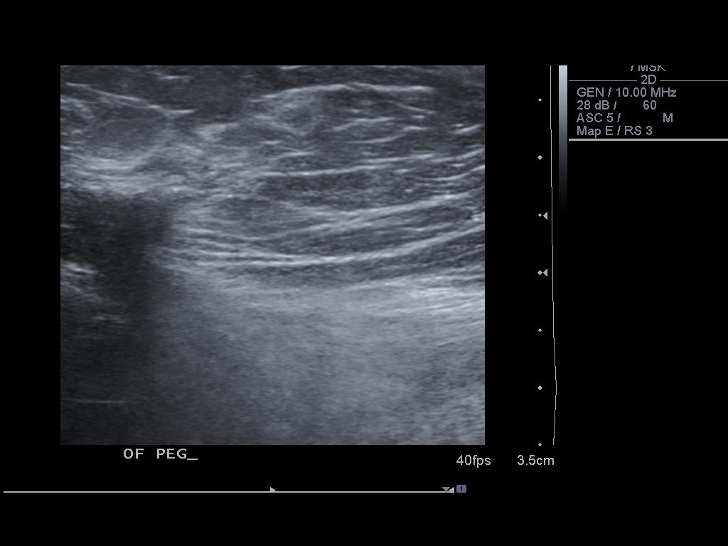
[im 6/11]
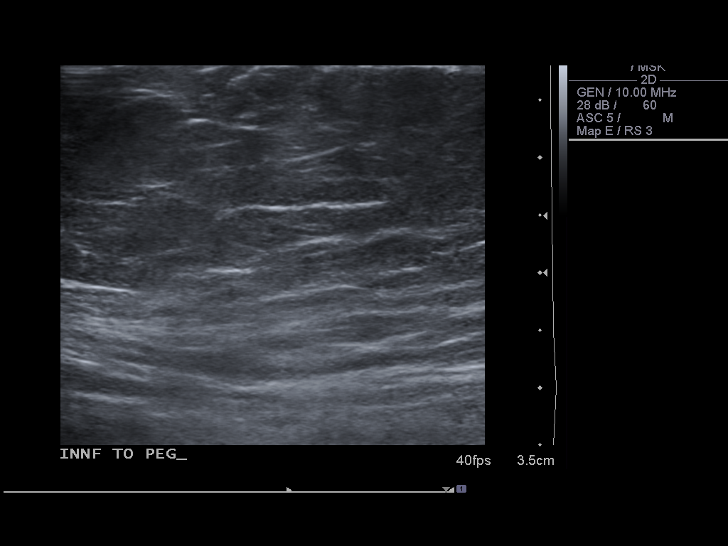
[im 7/11]
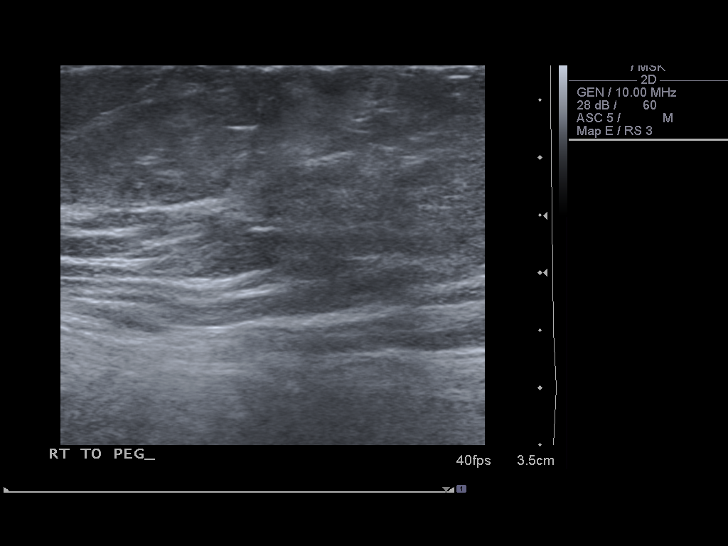
[im 8/11]
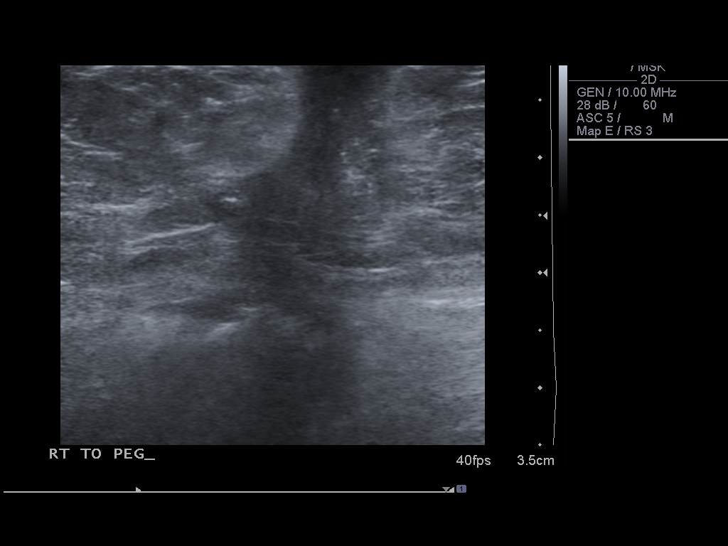
[im 9/11]
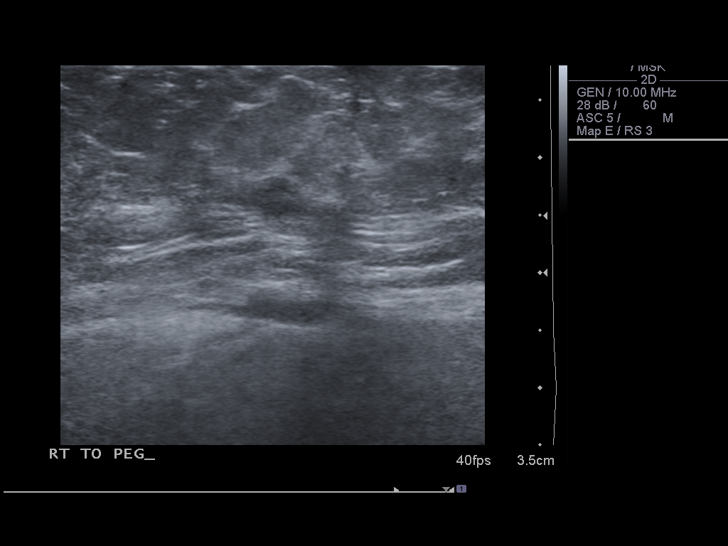
[im 10/11]
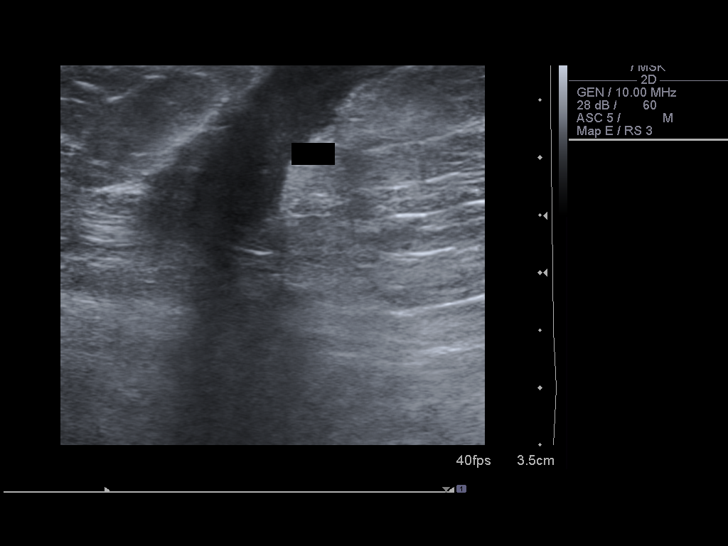
[im 11/11]
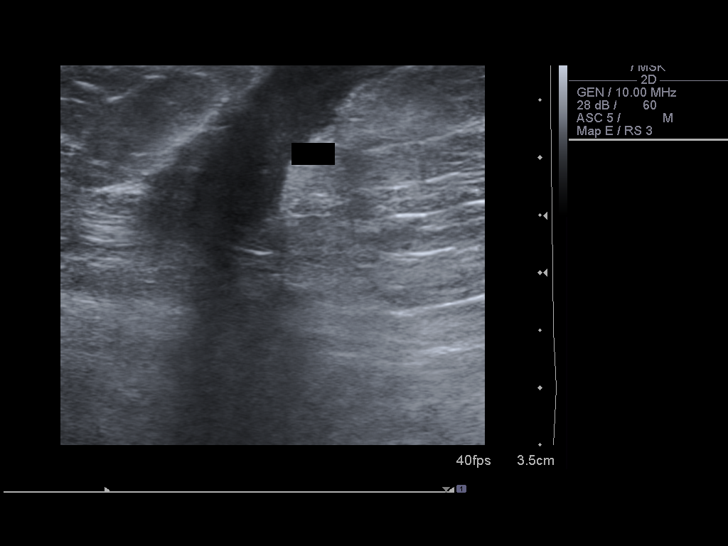

[11 of 11 positions shown; findings below may reference images not displayed]

FINDINGS: No definite abscess or fluid collection noted around the
peg tube.
IMPRESSION: No abscess or fluid collection demonstrated.

## 2009-12-22 ENCOUNTER — Emergency Department (HOSPITAL_COMMUNITY): Admission: EM | Admit: 2009-12-22 | Discharge: 2009-12-22 | Payer: Self-pay | Admitting: Emergency Medicine

## 2009-12-23 IMAGING — US US ABDOMEN PORT
1 series · 7 of 7 positions shown · non-contrast
Comparison: None.

CLINICAL DATA: Sepsis/abnormal liver function studies.

ABDOMEN ULTRASOUND
TECHNIQUE: Routine exam at the bedside.

[Series 1: us abdomen port · 0.30mm/px · 7 of 7 slices shown]
[im 1/7]
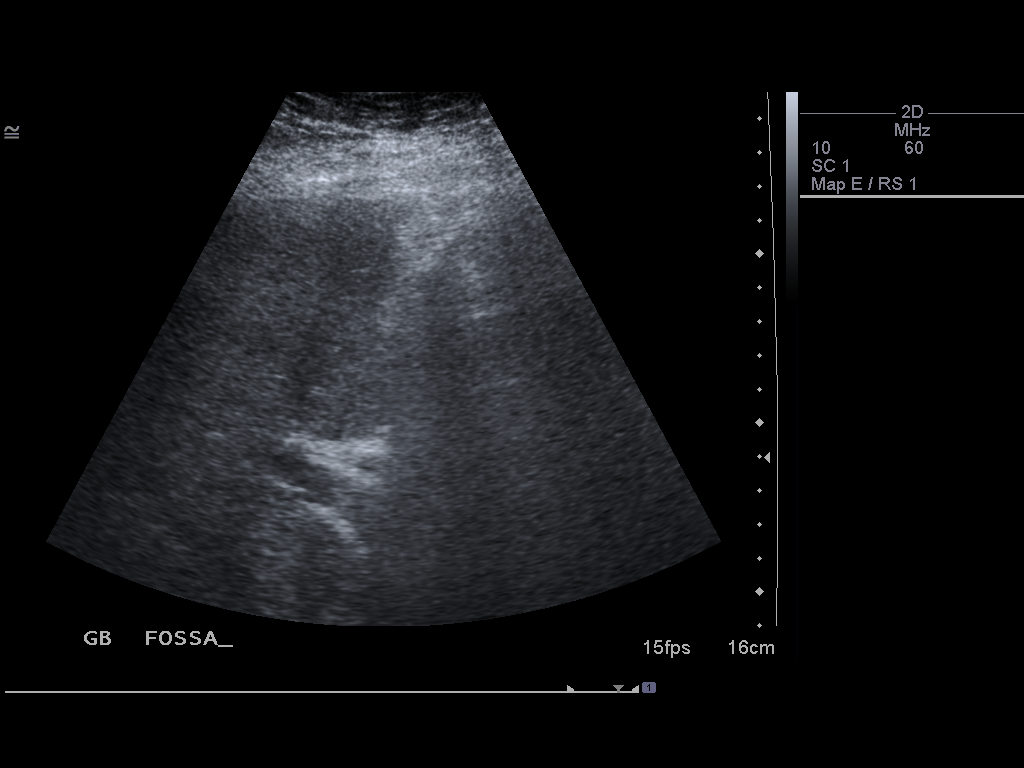
[im 2/7]
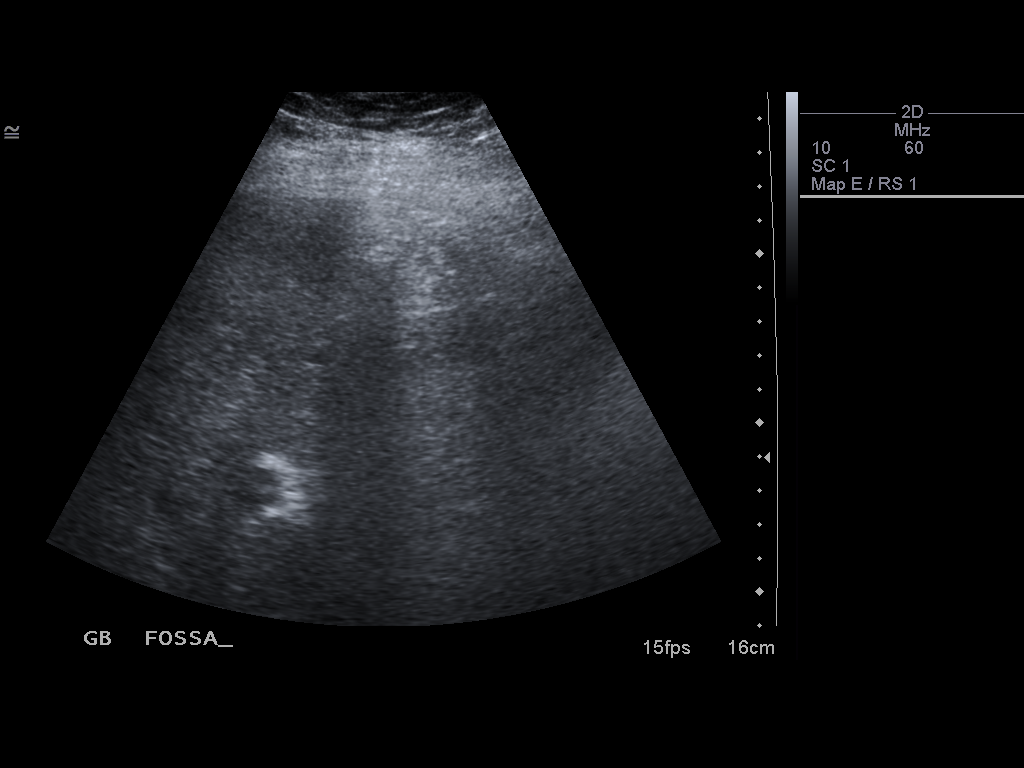
[im 3/7]
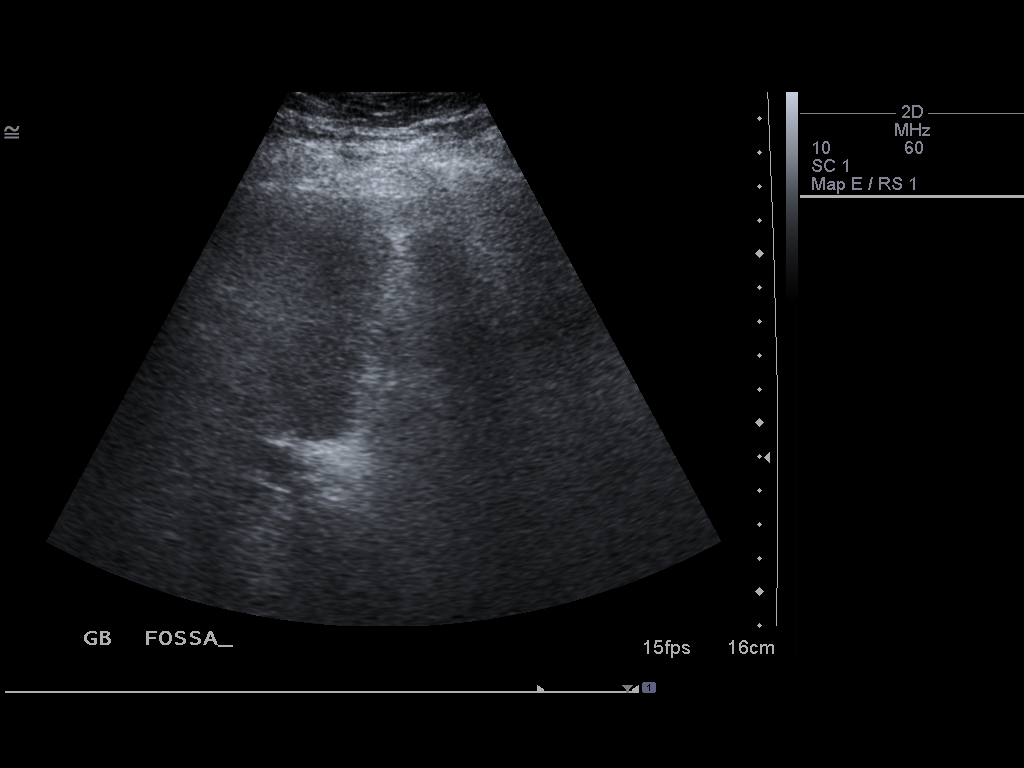
[im 4/7]
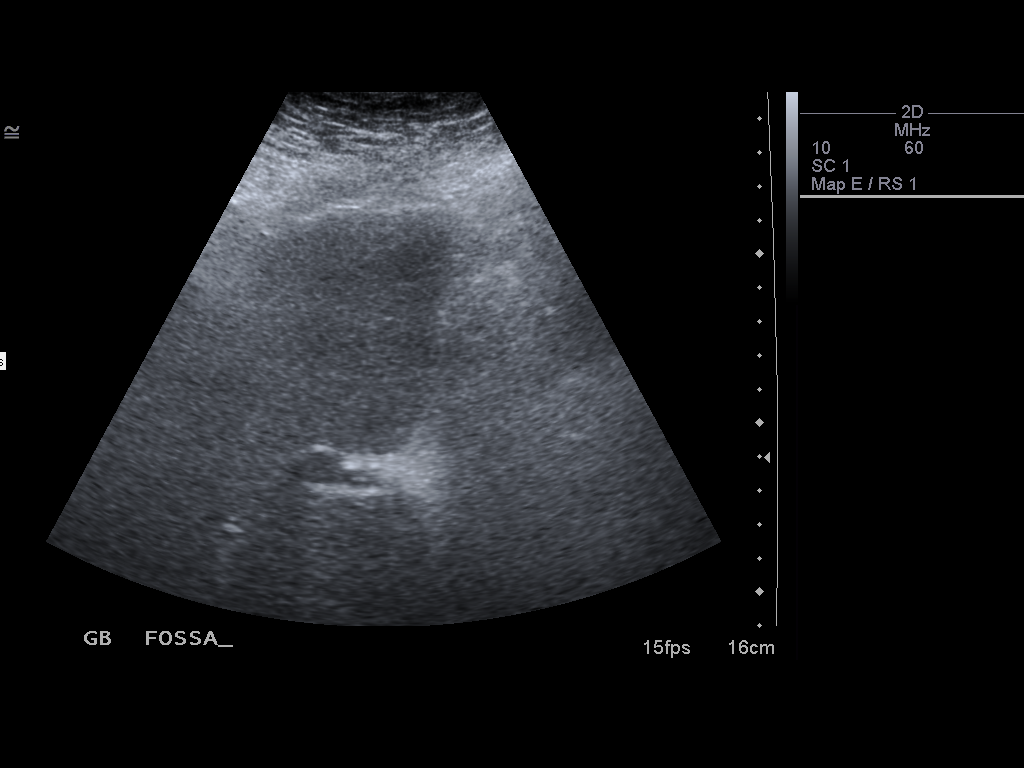
[im 5/7]
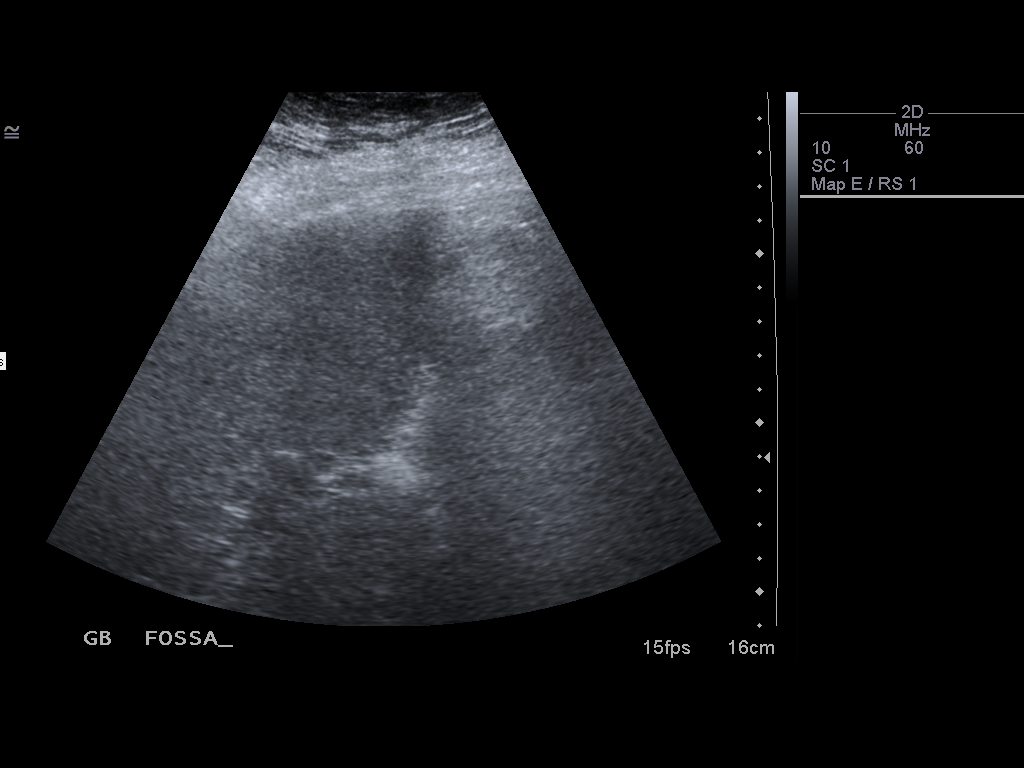
[im 6/7]
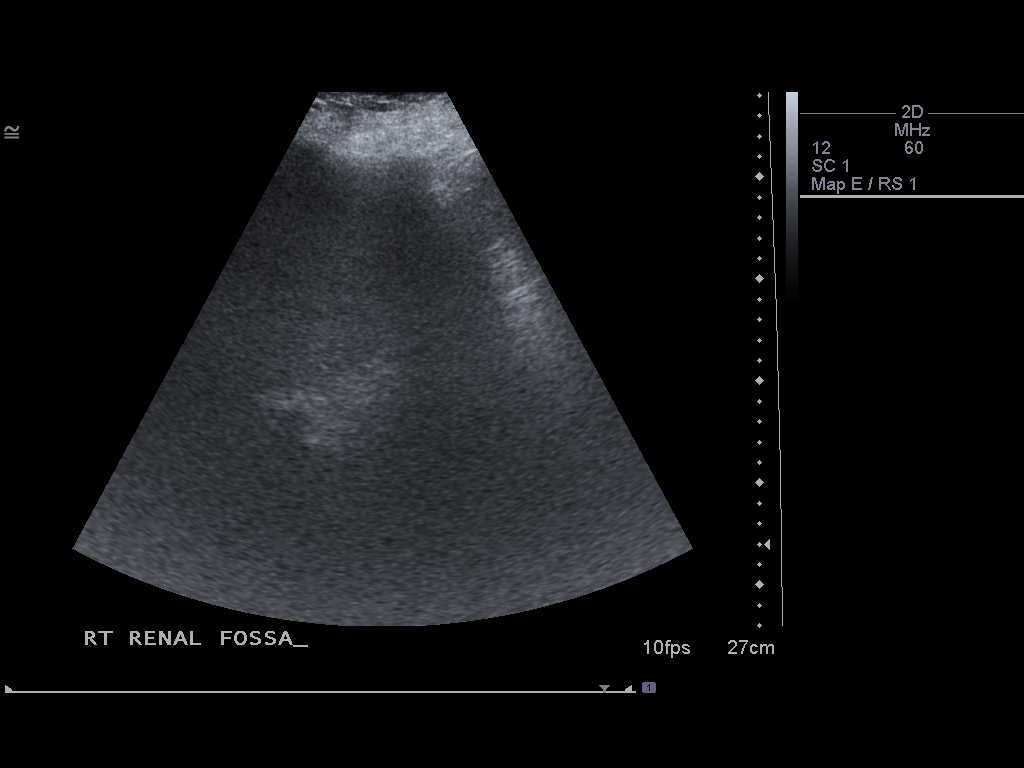
[im 7/7]
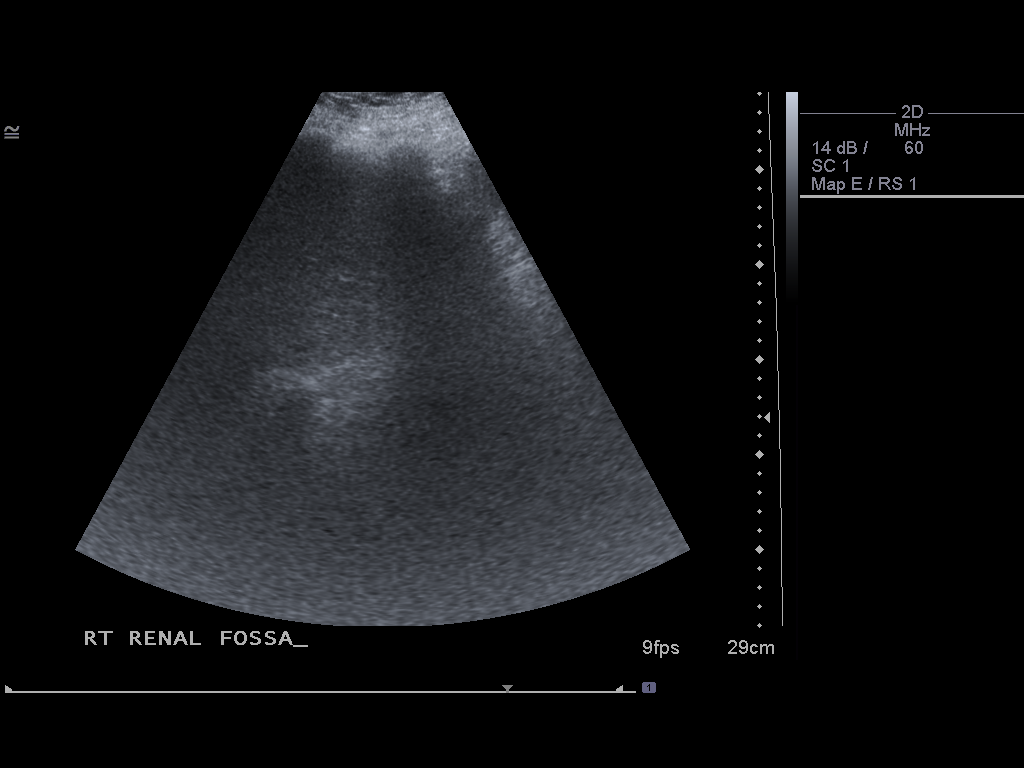

[7 of 7 positions shown; findings below may reference images not displayed]

FINDINGS: The gallbladder is not visualized.  According to nursing
staff, the patient's tube feedings were discontinued 6 hours
earlier, and so the gallbladder should be visualized . The patient
has a right upper quadrant surgical scar and may have had a prior
cholecystectomy.  This needs clinical correlation.  No intra or
extrahepatic biliary dilatation.  The common duct measures 5.2 mm.

The liver shows diffuse increase in echogenicity density.  This is
most commonly seen with steatosis but also can be seen with diffuse
hepatic cellular disease of other etiology.  The only focal lesion
is a 1 cm cyst.

Pancreas is inadequately demonstrated.  Spleen within normal
limits.  IVC and aorta normal.

The left kidney is 14.6 cm.  The right kidney is not visualized.
Has the patient had a right nephrectomy?  Is the patient known to
have congenital absence of the right kidney?.
IMPRESSION: 1.  The gallbladder is not visualized - see above discussion.
2.  Echodense liver most likely due to steatosis versus diffuse
hepatic cellular disease of other etiology.
3.  Pancreas inadequately demonstrated.
4.  The right kidney is not visualized.  The left is relatively
large raising the question of compensatory hypertrophy.  See
discussion.

## 2009-12-23 IMAGING — CR DG CHEST 1V PORT
1 series · 1 of 1 positions shown · non-contrast
Comparison: 11/17/2009

CLINICAL DATA: Sepsis.  Pneumonia.

PORTABLE CHEST - 1 VIEW

[view not recorded]
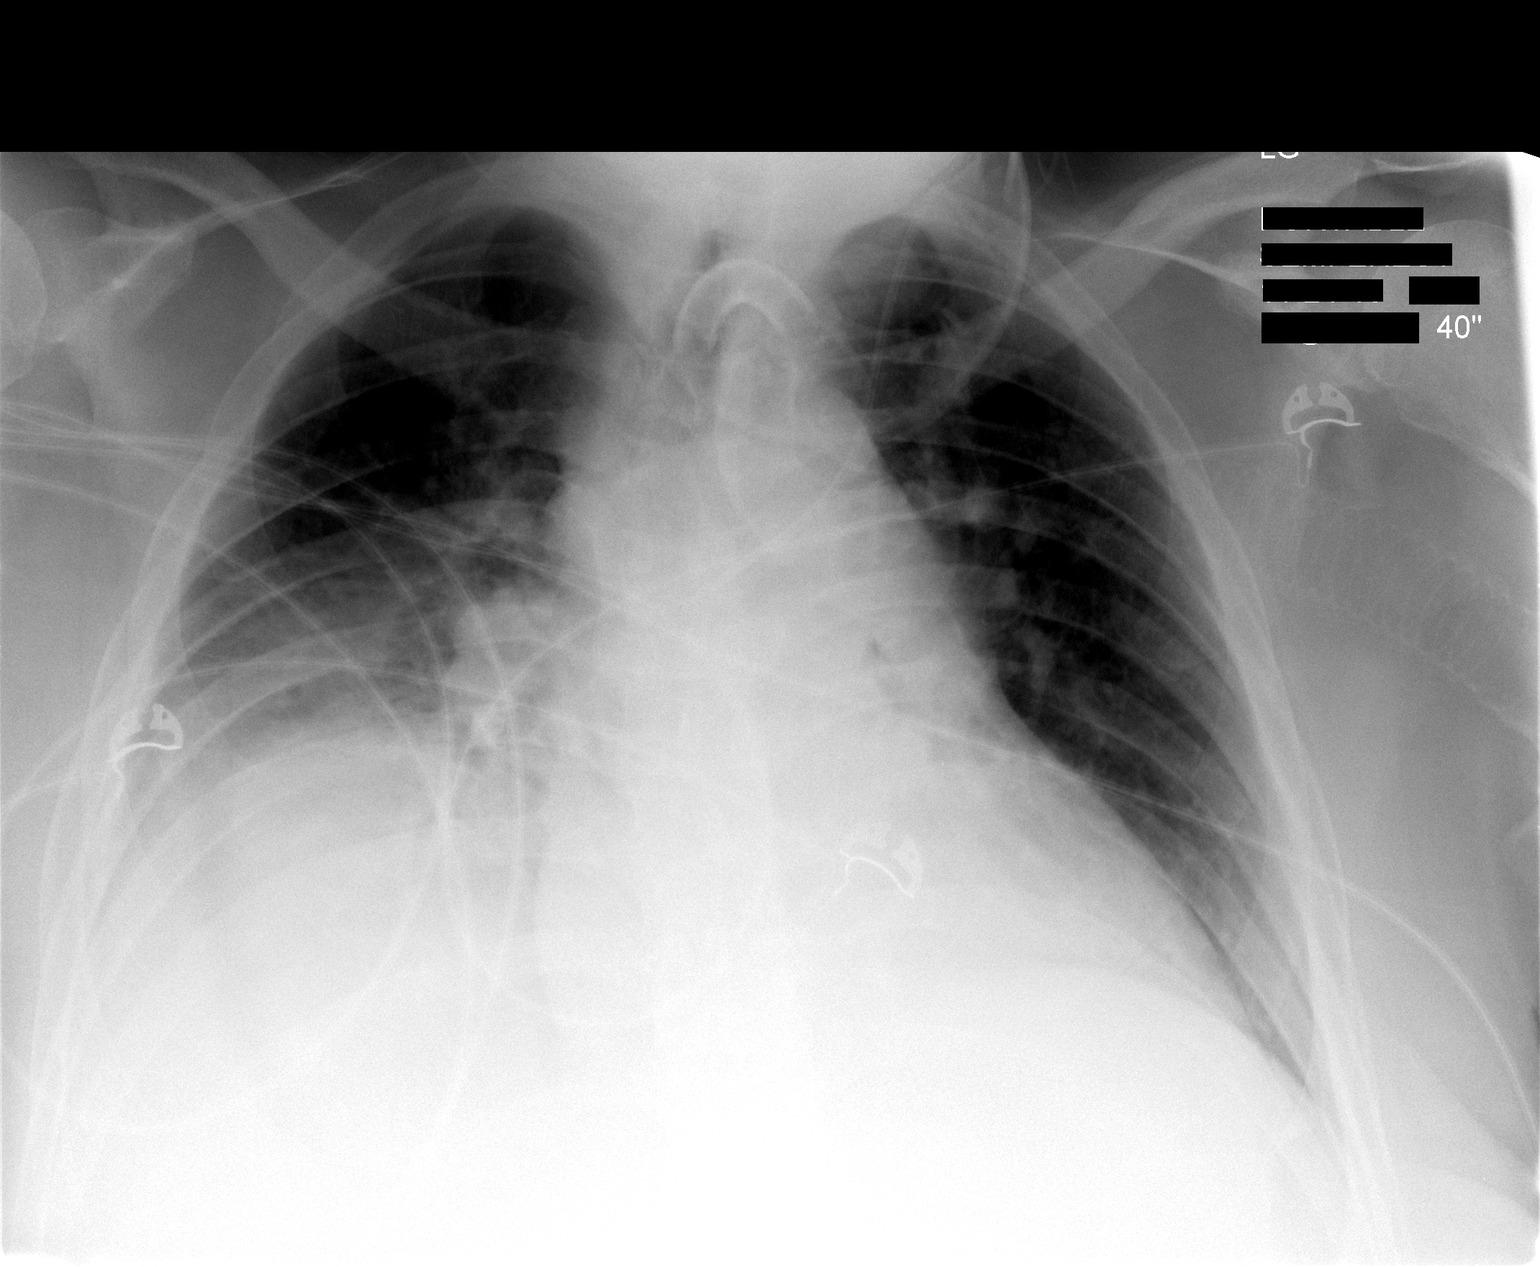

[1 of 1 positions shown; findings below may reference images not displayed]

FINDINGS: Tracheostomy tube remains in place.  Kyphosis noted. Low
lung volumes are present, causing crowding of the pulmonary
vasculature.  Airspace opacity is again noted at the right lung
base with slight elevation of the right hemidiaphragm.

Left central venous catheter tip is at the brachial cephalic
confluence.  There is a suggestion of mildly increased left
perihilar opacity.
IMPRESSION: 1.  Mildly increased left perihilar airspace opacity, probably
atelectasis, but warranting attention on follow-up exams.
2.  Right lower lobe airspace opacity with mild elevation of the
right hemidiaphragm, favoring atelectasis although pneumonia could
have a similar appearance.
3. Low lung volumes are present, causing crowding of the pulmonary
vasculature.

## 2009-12-24 IMAGING — CR DG CHEST 1V PORT
1 series · 1 of 1 positions shown · non-contrast
Comparison: Portable chest x-ray yesterday, 11/17/2009, and
11/14/2009.

CLINICAL DATA: Sepsis.  Chronic ventilator dependent respiratory
failure.  Follow up right lower lobe atelectasis versus pneumonia.

PORTABLE CHEST - 1 VIEW [DATE]/[id number] hours:

[view not recorded]
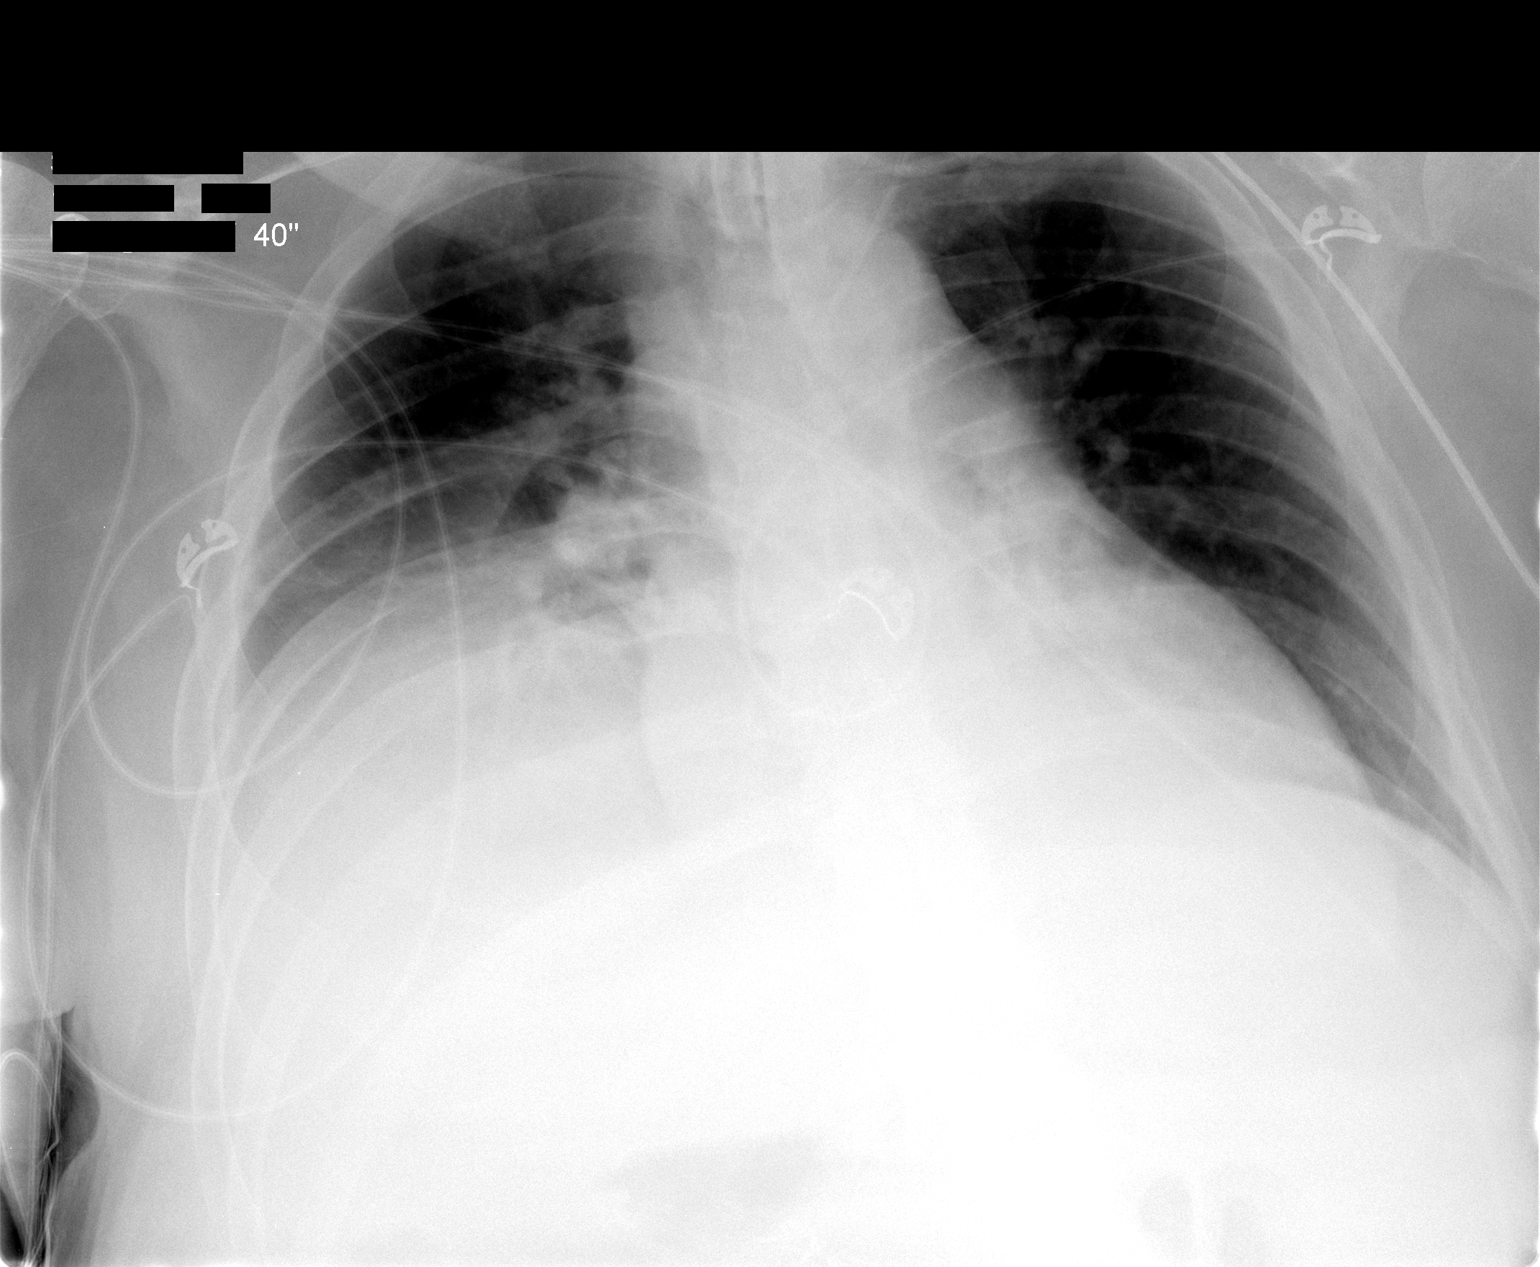

[1 of 1 positions shown; findings below may reference images not displayed]

FINDINGS: Persistent dense consolidation in the right lower lobe.
Better aeration in the left perihilar region since the study
yesterday.  No new pulmonary parenchymal abnormalities.  Heart
enlarged but stable.  Tracheostomy tube tip in satisfactory
position below the thoracic inlet.  Left jugular central venous
catheter tip in the left innominate vein.
IMPRESSION: Support apparatus satisfactory.  Stable dense atelectasis and/or
pneumonia in the right lower lobe.  Improved aeration in the left
perihilar region.  No new abnormalities.

## 2009-12-26 IMAGING — CR DG CHEST 1V PORT
1 series · 1 of 1 positions shown · non-contrast
Comparison: 11/23/2009

CLINICAL DATA: Sepsis.

PORTABLE CHEST - 1 VIEW

[view not recorded]
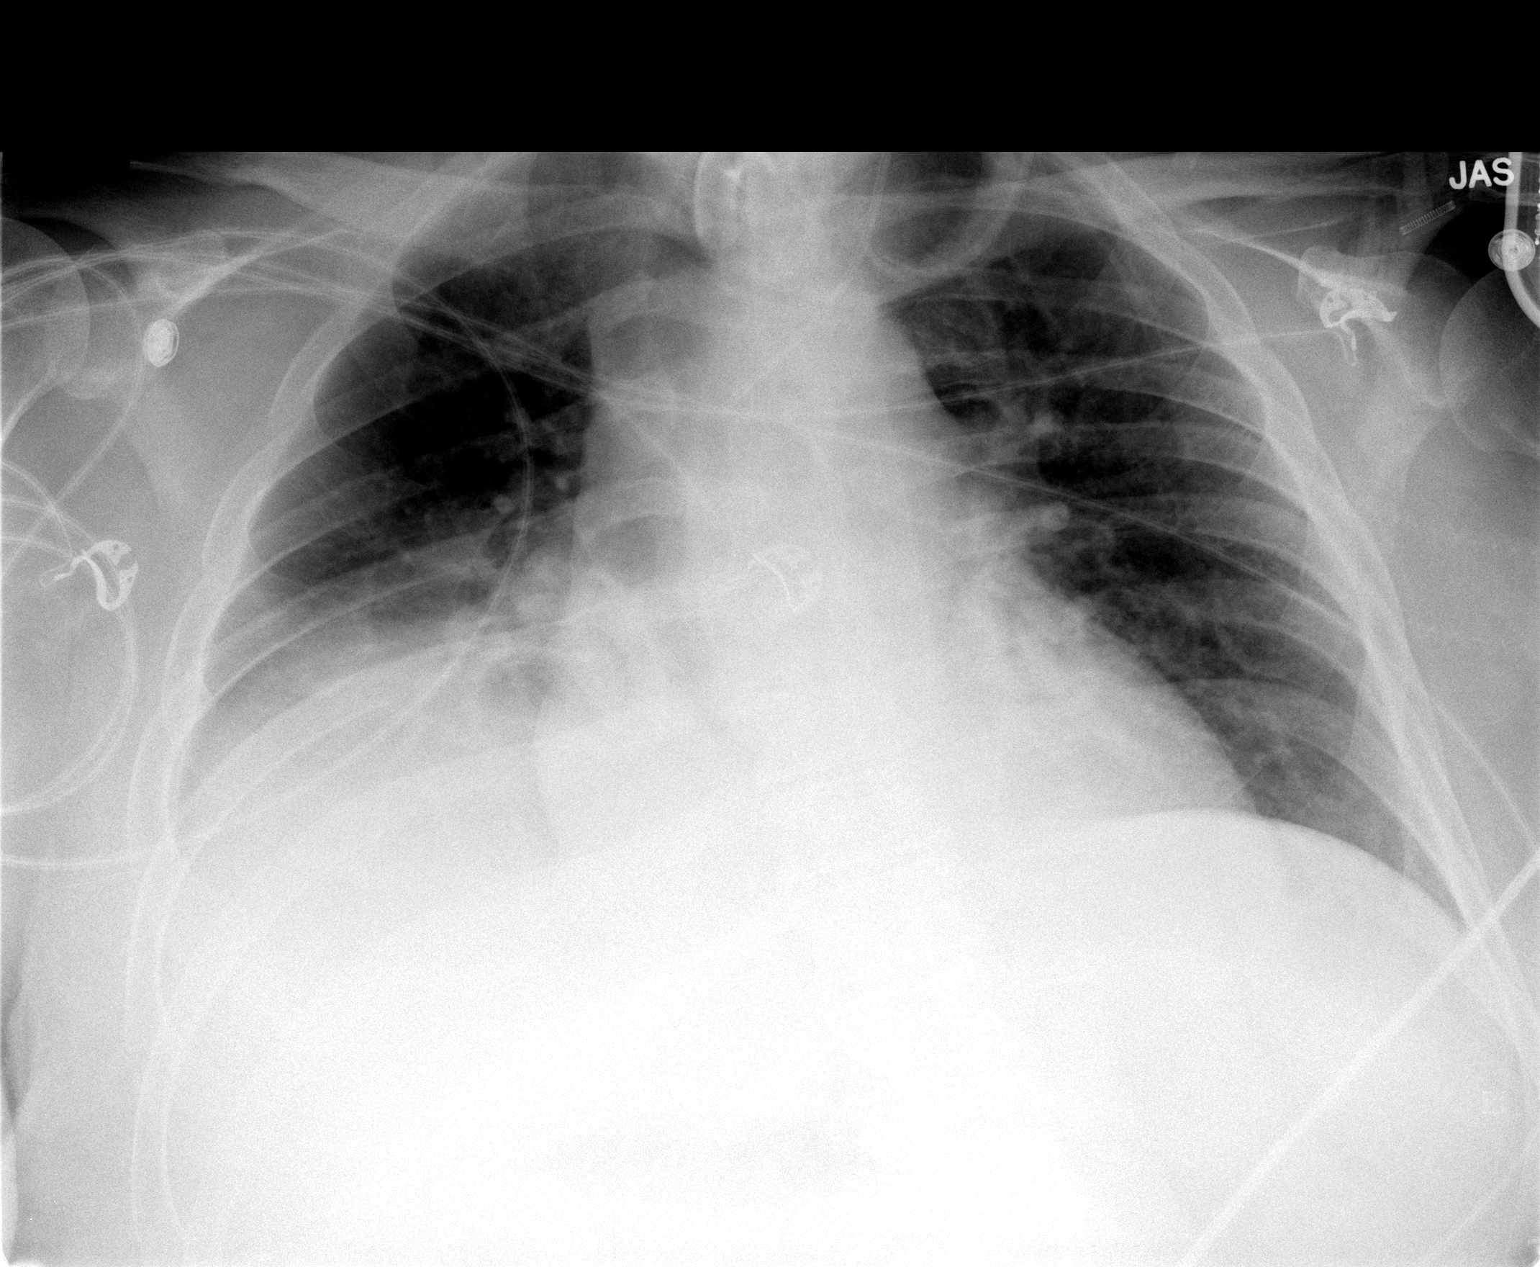

[1 of 1 positions shown; findings below may reference images not displayed]

FINDINGS: The tracheostomy tube is stable.  Persistent cardiac
enlargement, vascular congestion, pulmonary edema,  right pleural
effusion and right lower lobe atelectasis or infiltrate.  No
pneumothorax.
IMPRESSION: Stable chest x-ray.

## 2009-12-28 IMAGING — CR DG CHEST 1V PORT
1 series · 1 of 1 positions shown · non-contrast
Comparison: 11/23/2009

CLINICAL DATA: Shortness of breath, sepsis.

PORTABLE CHEST - 1 VIEW

[view not recorded]
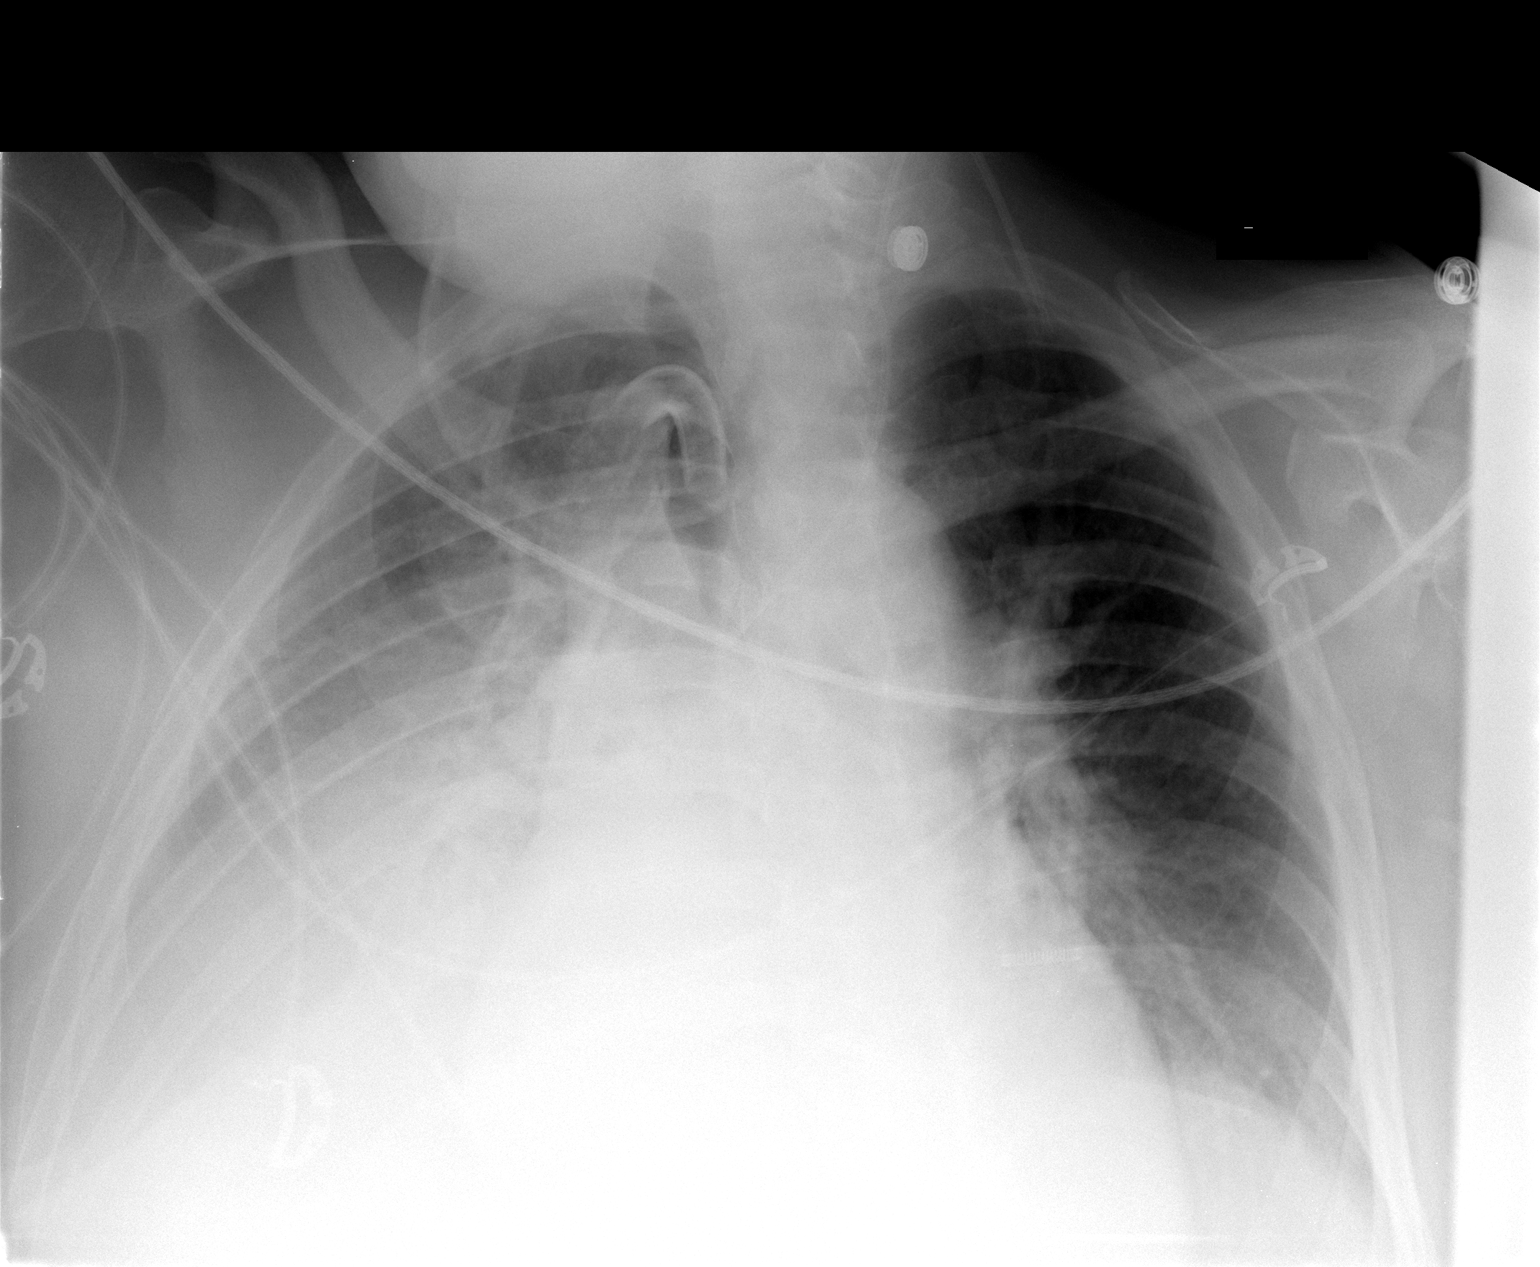

[1 of 1 positions shown; findings below may reference images not displayed]

FINDINGS: Patient is rotated.  Tracheostomy is midline.  Heart size
is grossly stable.  Right pleural effusion and bilateral air space
disease persist. Right hemidiaphragm is elevated.
IMPRESSION: Edema and right pleural effusion.

## 2009-12-29 IMAGING — XA IR REPLACE G-TUBE/COLONIC TUBE
1 series · 2 of 2 positions shown · non-contrast
Comparison: None.

CLINICAL DATA: G tube accidentally removed, Chronic tube feeds

GASTROSTOMY CATHETER REPLACEMENT

[Series 1: fl - angio · 2 of 2 slices shown]
[im 1/2]
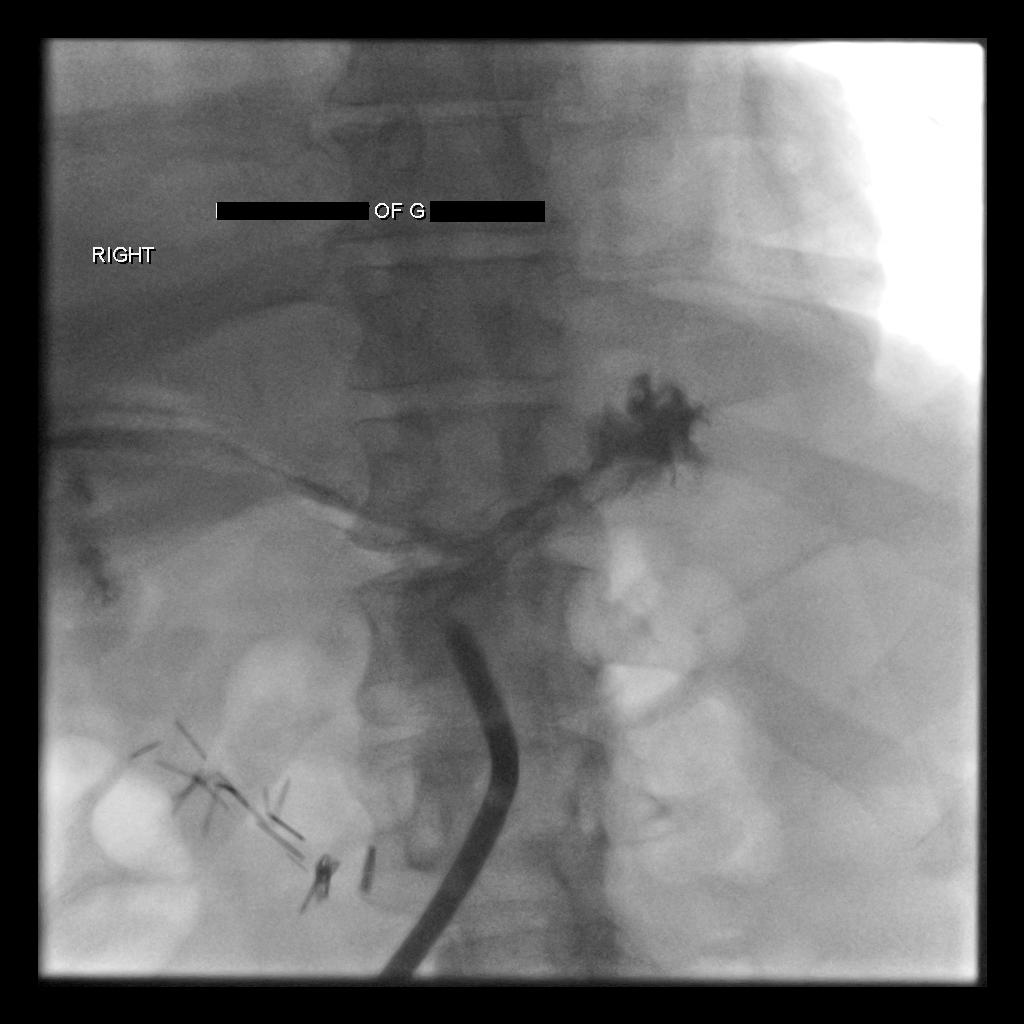
[im 2/2]
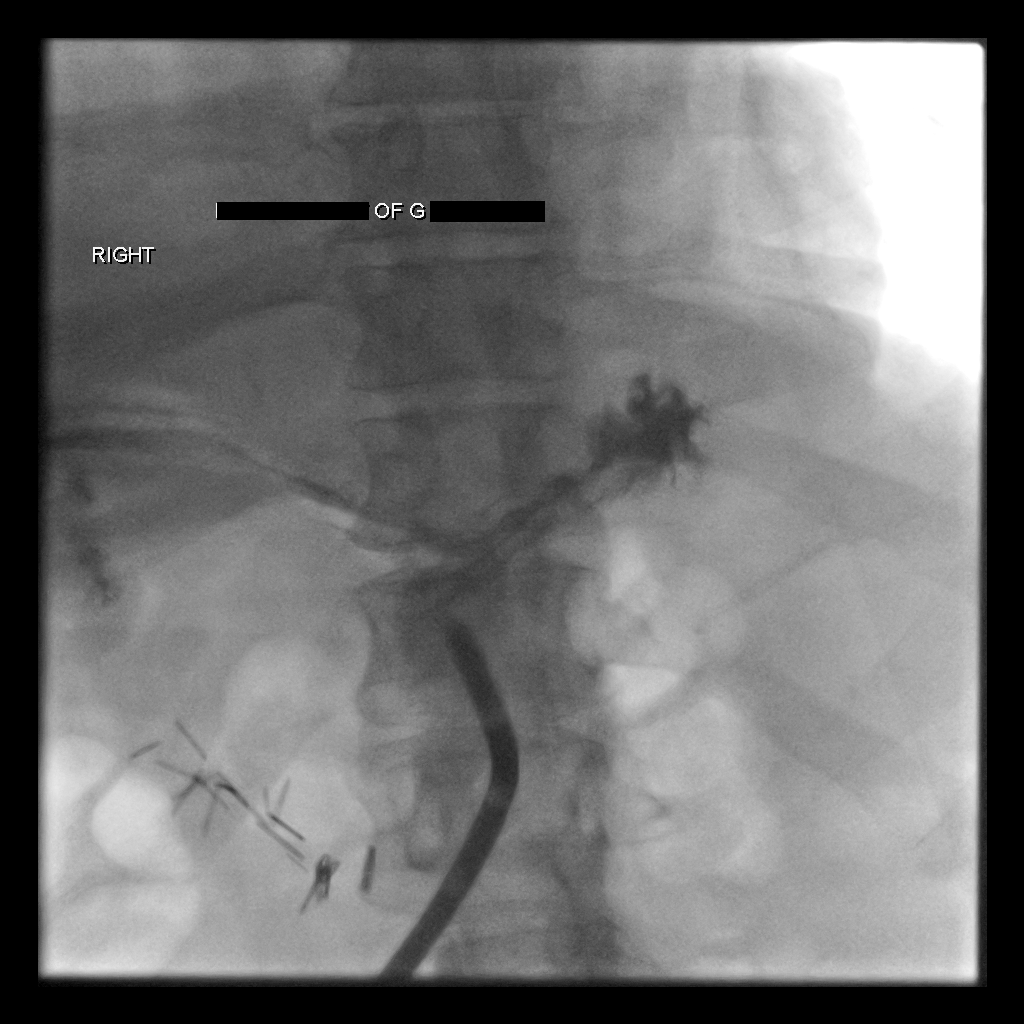

[2 of 2 positions shown; findings below may reference images not displayed]

FINDINGS: Informed consent was obtained from the patient verbally.

The existing Foley catheter in the percutaneous tract was cut and
removed.  A new 28-French balloon retention gastrostomy was
advanced through the percutaneous tract into the stomach.  Balloon
tip was inflated with 10 ml  saline containing 1 ml contrast.  This
was retracted against the anterior gastric wall and secured
externally.  Contrast injection confirms position in the stomach.
Gastrostomy was flushed with saline.  Access ready for use.  No
immediate complication.
IMPRESSION: Fluoroscopic replacement of a 28-French gastrostomy

## 2010-01-04 ENCOUNTER — Emergency Department (HOSPITAL_COMMUNITY): Admission: EM | Admit: 2010-01-04 | Discharge: 2010-01-04 | Payer: Self-pay | Admitting: Emergency Medicine

## 2010-01-19 ENCOUNTER — Inpatient Hospital Stay (HOSPITAL_COMMUNITY): Admission: EM | Admit: 2010-01-19 | Discharge: 2010-01-24 | Payer: Self-pay | Admitting: Emergency Medicine

## 2010-01-19 ENCOUNTER — Ambulatory Visit: Payer: Self-pay | Admitting: Internal Medicine

## 2010-01-24 IMAGING — CR DG PELVIS 1-2V
2 series · 2 of 2 positions shown · non-contrast
Comparison: No similar prior study is available for comparison.

CLINICAL DATA: Fall, pelvic pain

PELVIS - 1-2 VIEW

[t pelvis a.p. (1 of 2)]
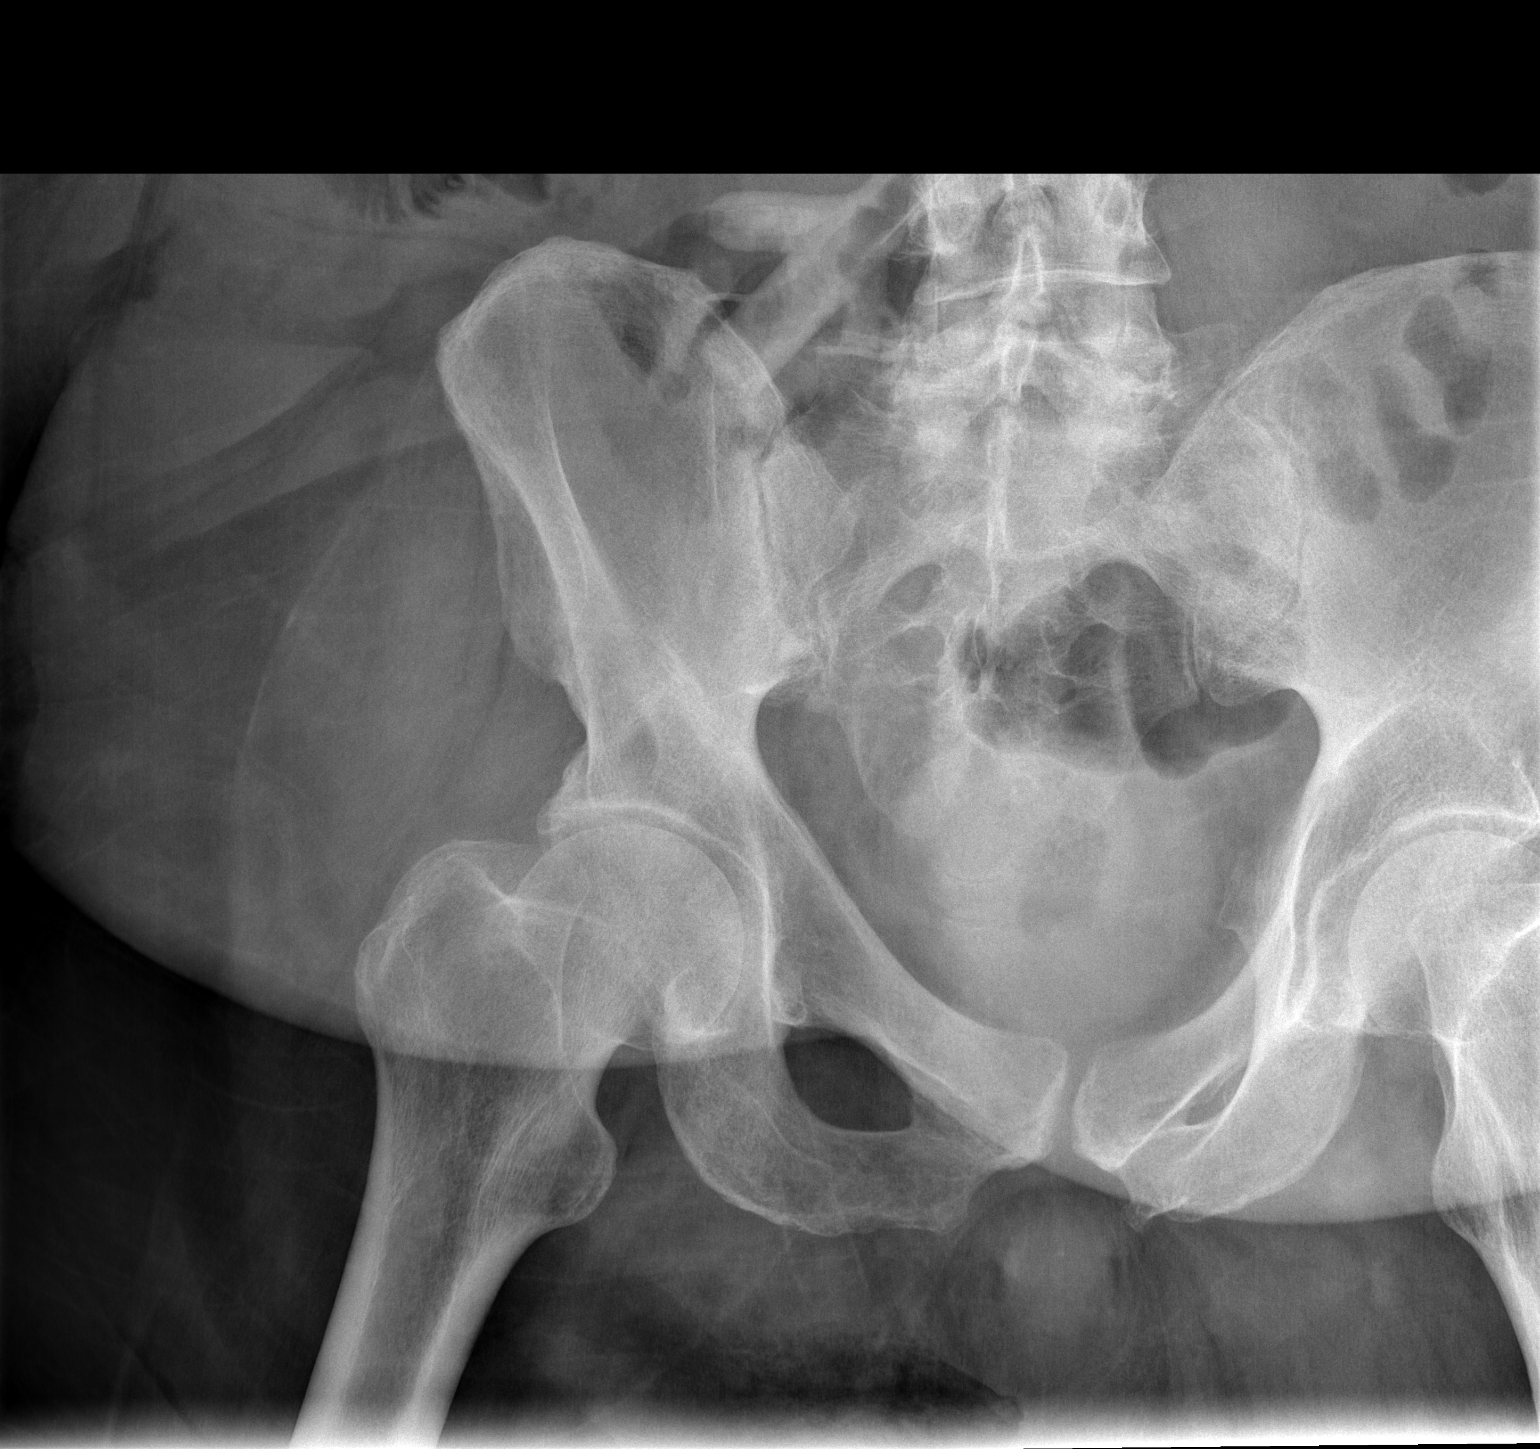

[t pelvis a.p. (2 of 2)]
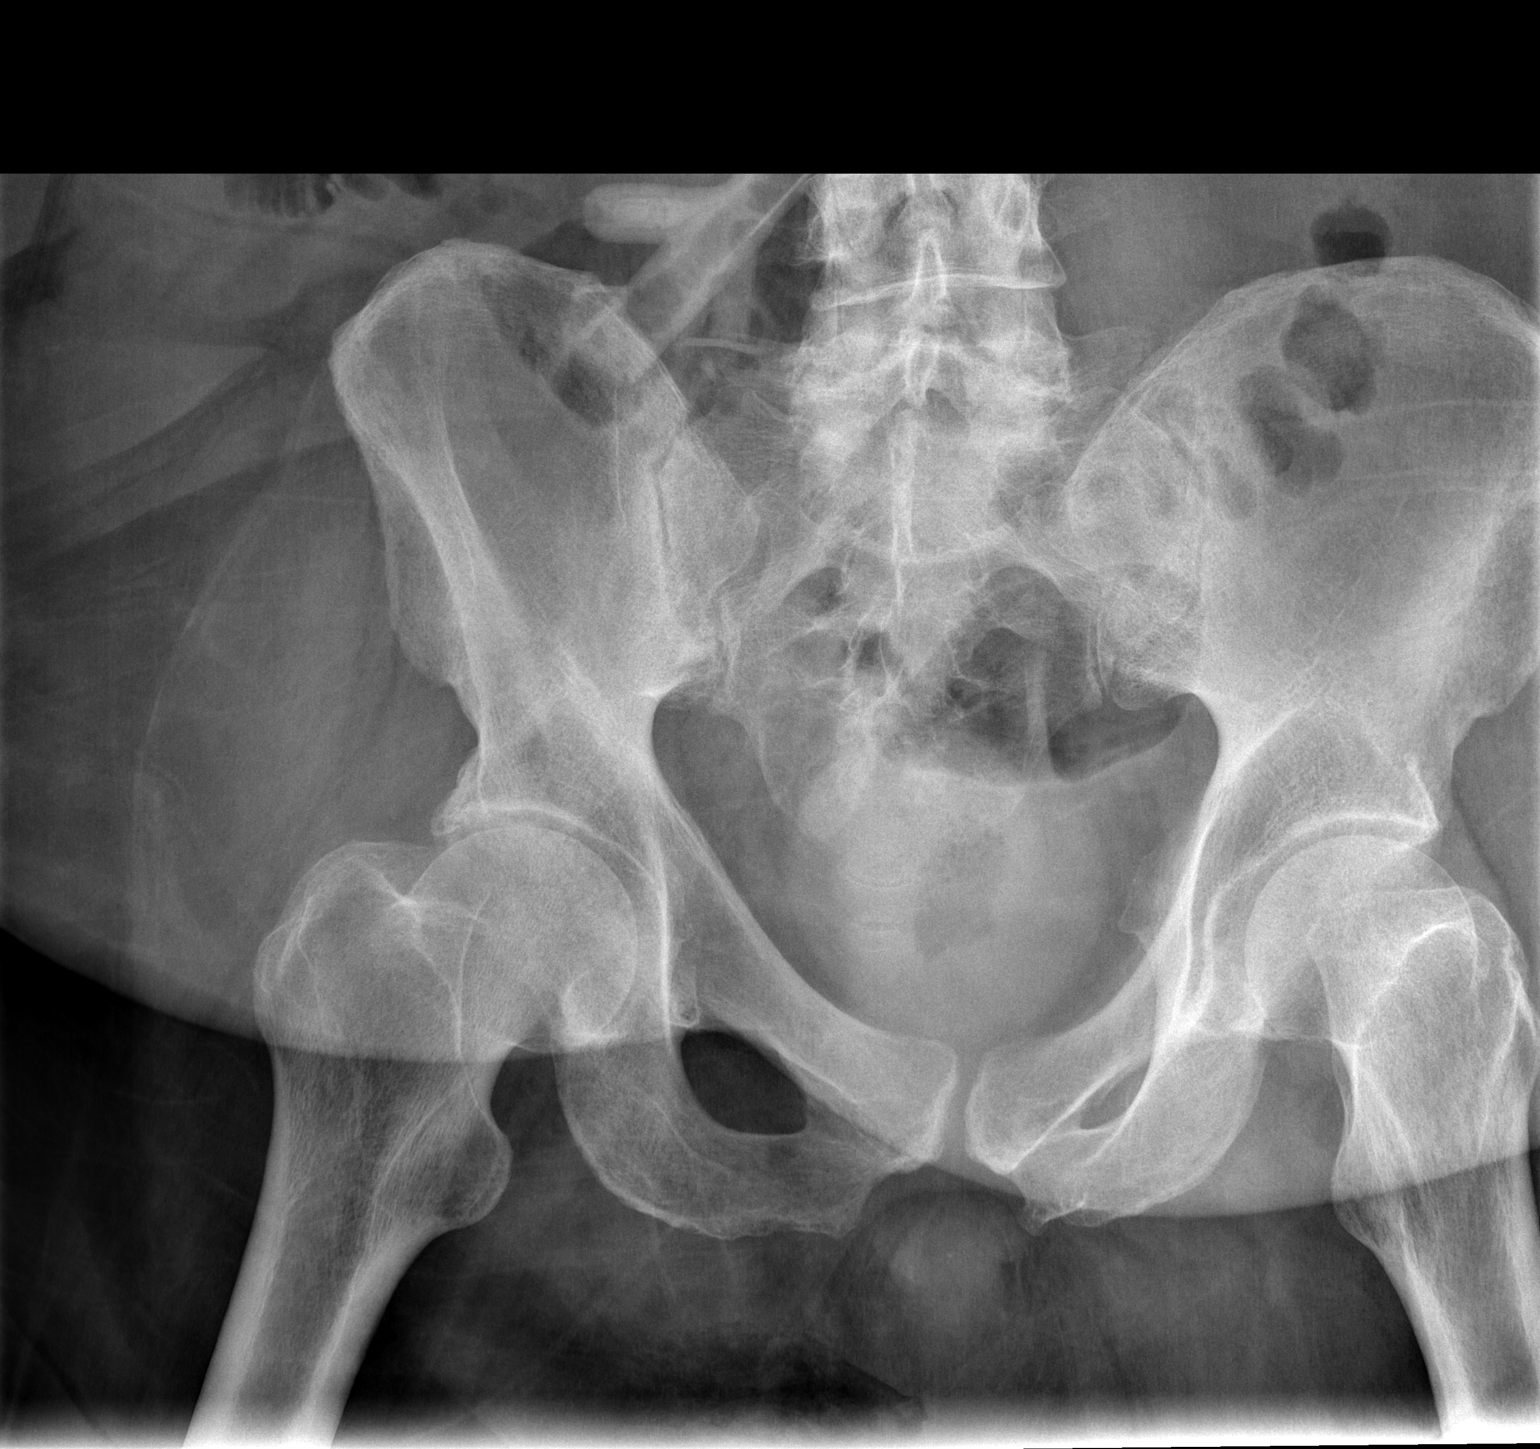

[2 of 2 positions shown; findings below may reference images not displayed]

FINDINGS: A percutaneous gastrostomy tube is partly visualized.  L5
- S1 degenerative change visualized.  The left iliac wing is not
completely included.  No displaced pelvic fracture is identified.
IMPRESSION: No acute bony abnormality of the pelvis, accounting for
positioning.

## 2010-01-24 IMAGING — CR DG RIBS W/ CHEST 3+V*L*
4 series · 4 of 4 positions shown · non-contrast
Comparison: 11/25/2009

CLINICAL DATA: Fall, left-sided chest and rib pain

LEFT RIBS AND CHEST - 3+ VIEW

[t ribs ap/pa upper left]
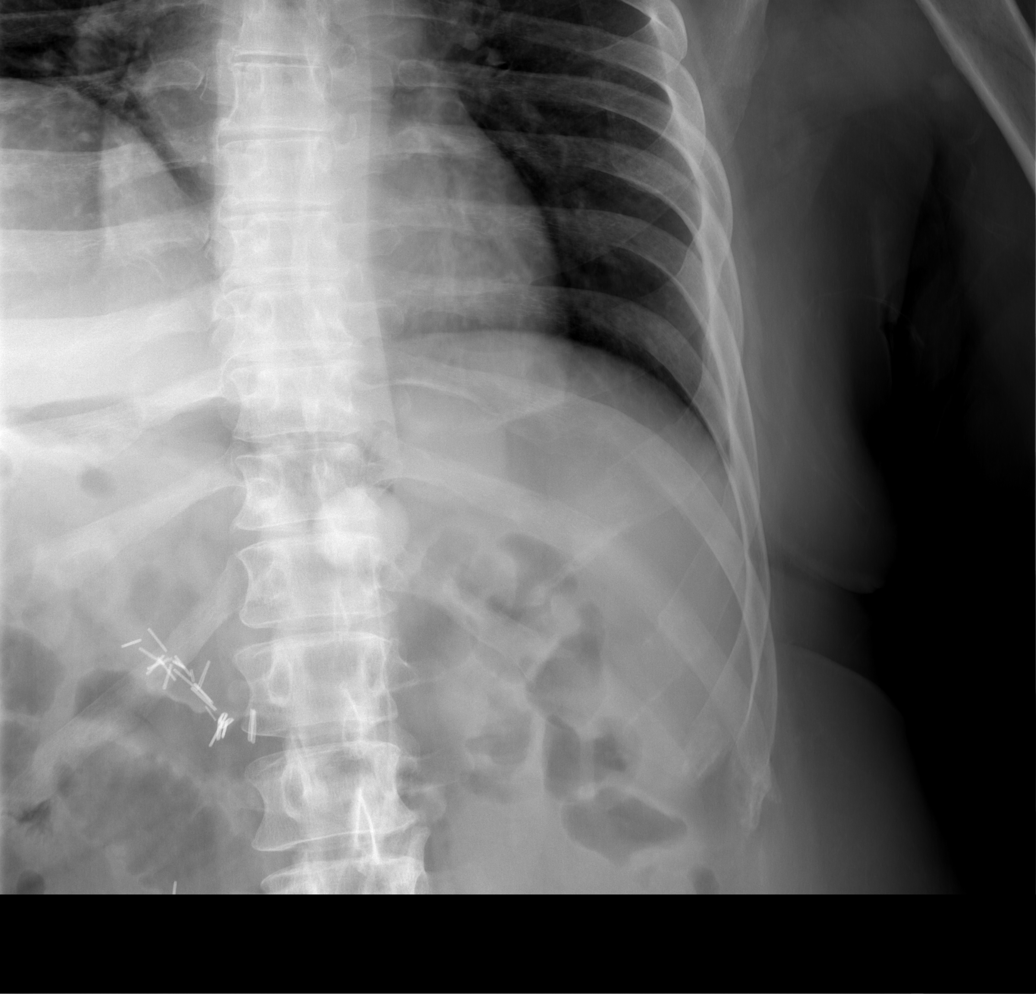

[t ribs ap/pa  lower left * (1 of 2)]
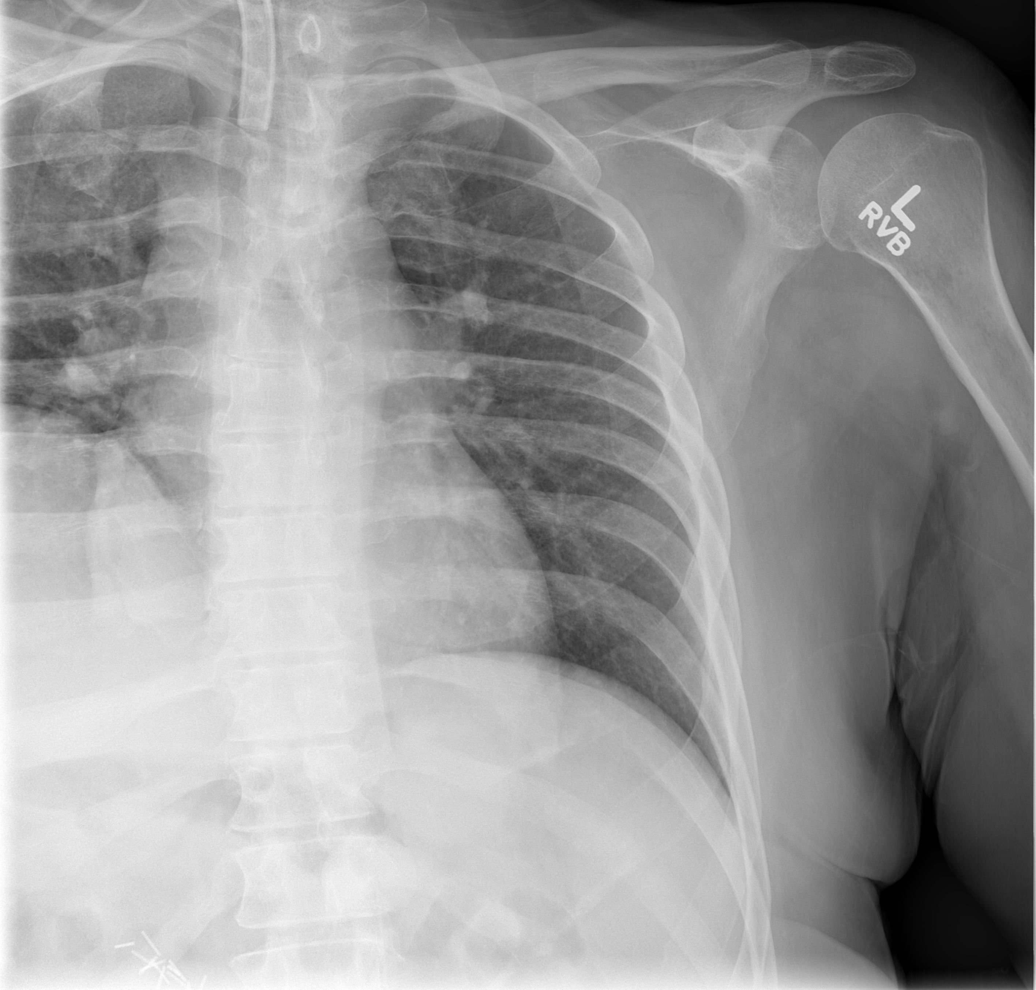

[t ribs ap/pa  lower left * (2 of 2)]
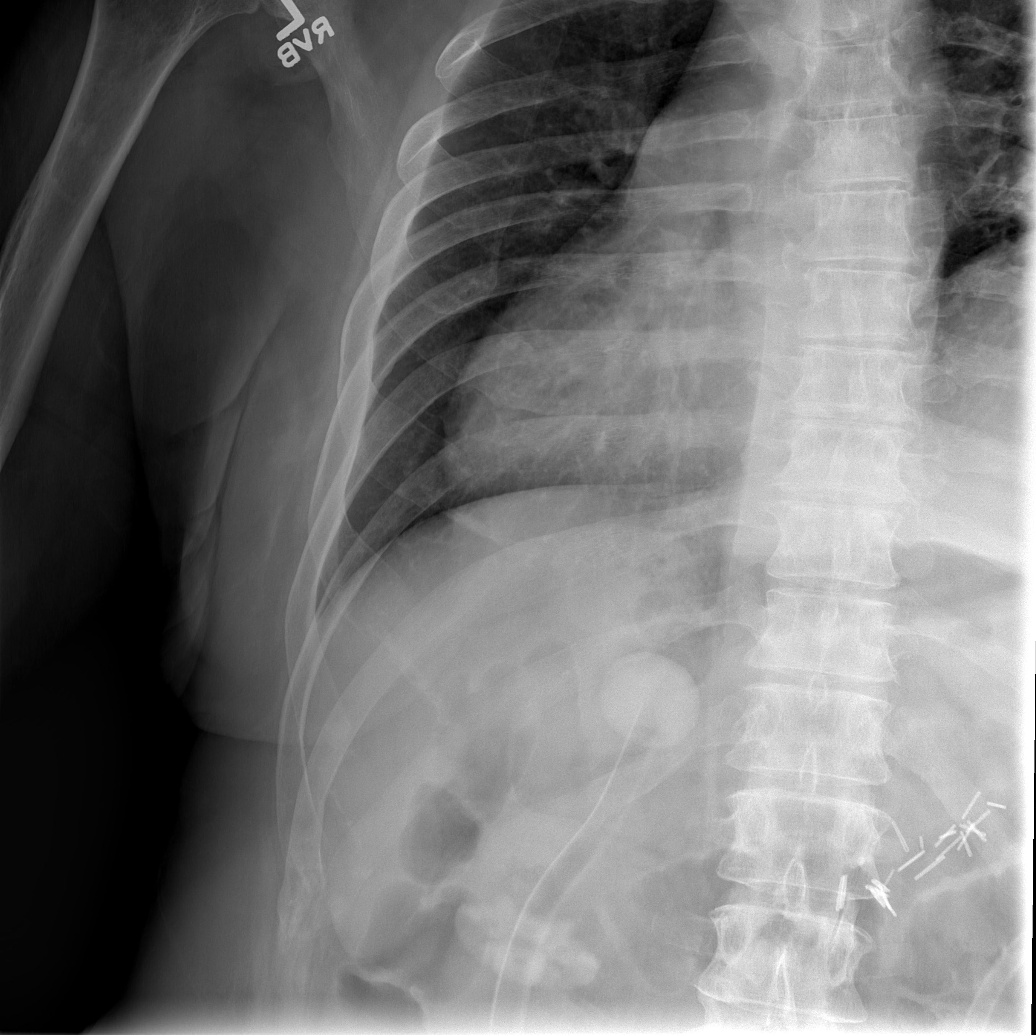

[view not recorded]
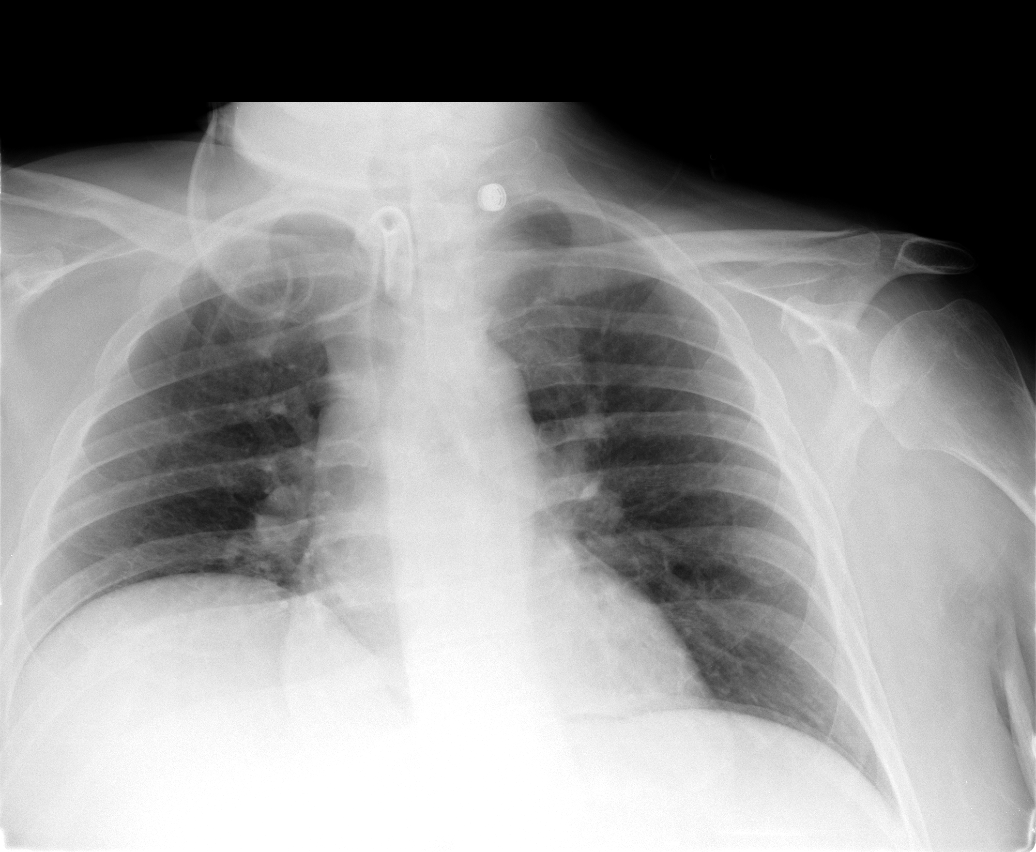

[4 of 4 positions shown; findings below may reference images not displayed]

FINDINGS: Previously seen right-sided opacity is resolved.
Tracheostomy tube remains appropriately positioned.  Heart size at
the upper limits of normal.  The lungs are clear.  Body habitus
obscures detail, but no displaced rib fracture is seen.  Abdominal
surgical clips partly visualized.  Percutaneous gastrostomy tube
partly visualized.
IMPRESSION: No acute cardiopulmonary process or displaced left-sided rib
fracture.

## 2010-01-24 IMAGING — CT CT HEAD W/O CM
1 series · 16 of 30 positions shown, 20 images · non-contrast
Comparison: None available.

CLINICAL DATA: Status post fall.

CT HEAD WITHOUT CONTRAST
TECHNIQUE: Contiguous axial images were obtained from the base of
the skull through the vertex without contrast.

[Series 2: head_seq 4.5 h37s st · axial · 0.43mm/px · z∈[+1156,+1304]mm · 16 of 36 slices shown, 20 images]
[im 2/36  brain]
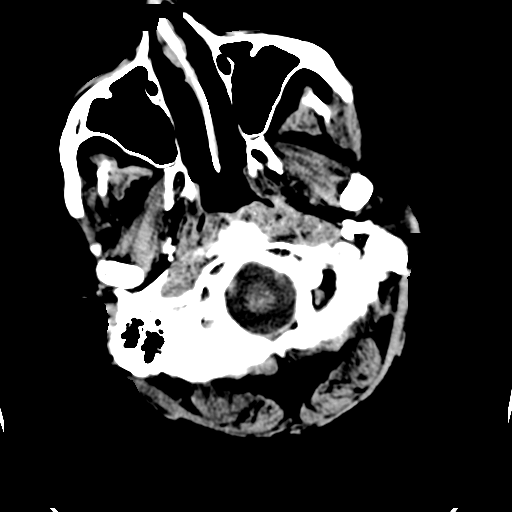
[im 2/36  bone]
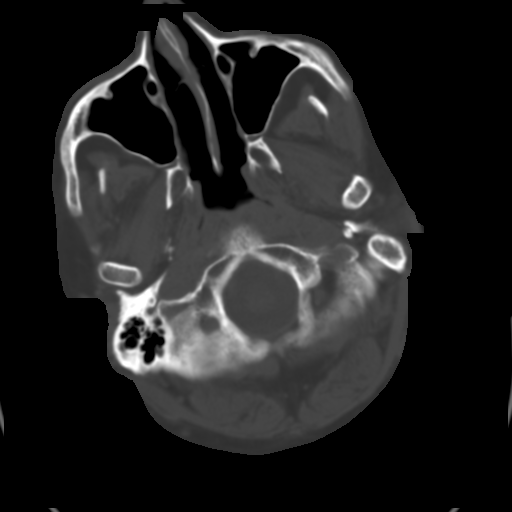
[im 4/36  brain]
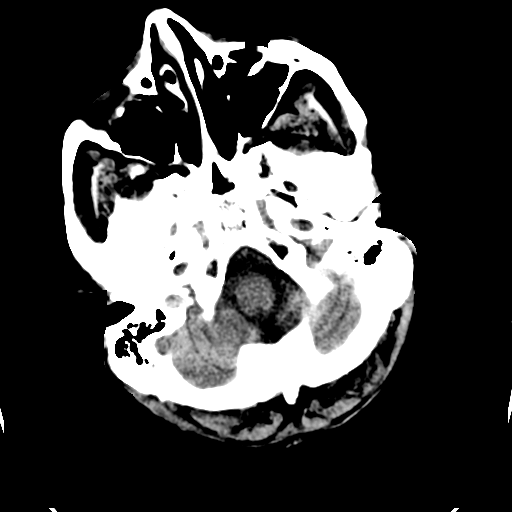
[im 7/36  brain]
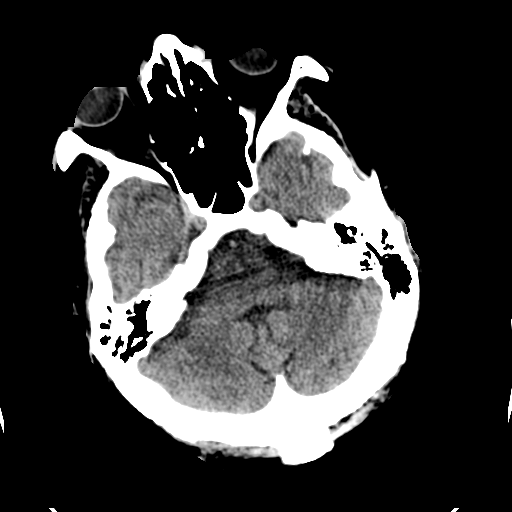
[im 9/36  brain]
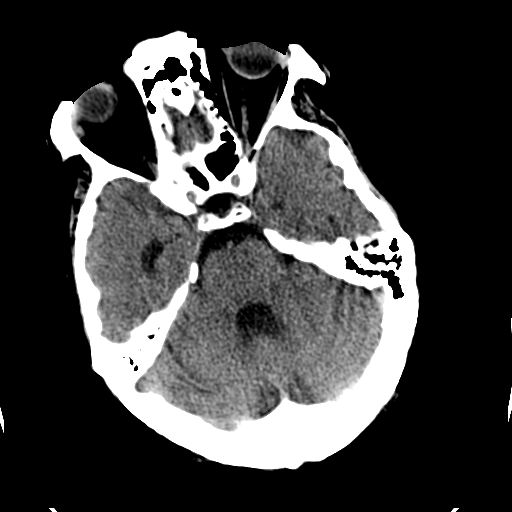
[im 10/36  brain]
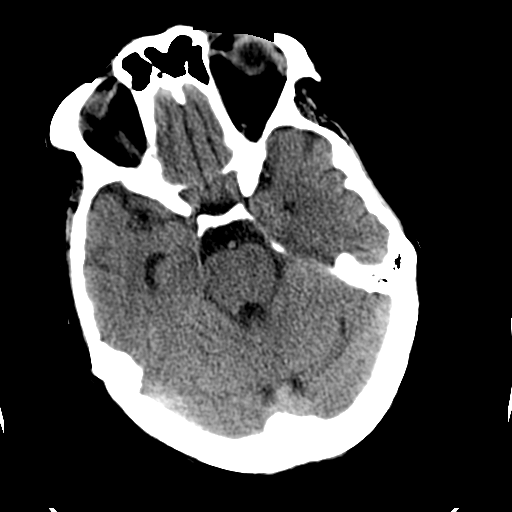
[im 10/36  bone]
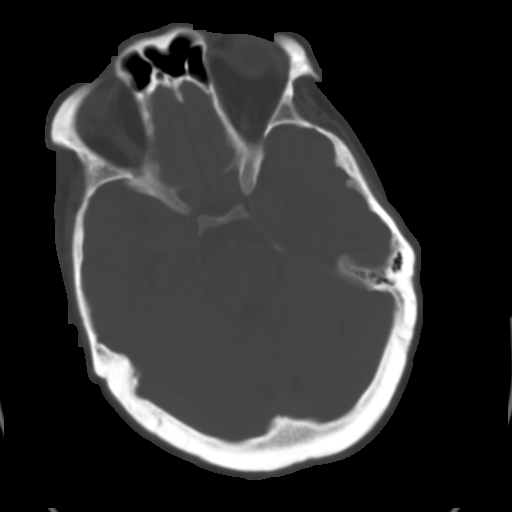
[im 13/36  brain]
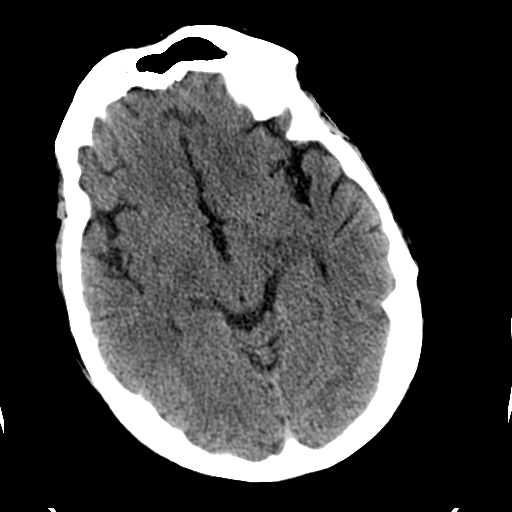
[im 15/36  brain]
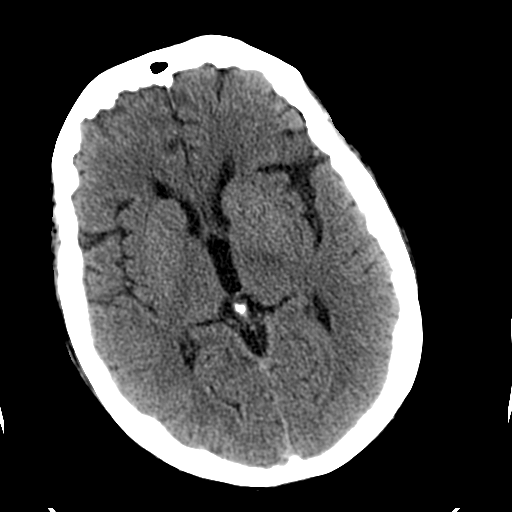
[im 17/36  brain]
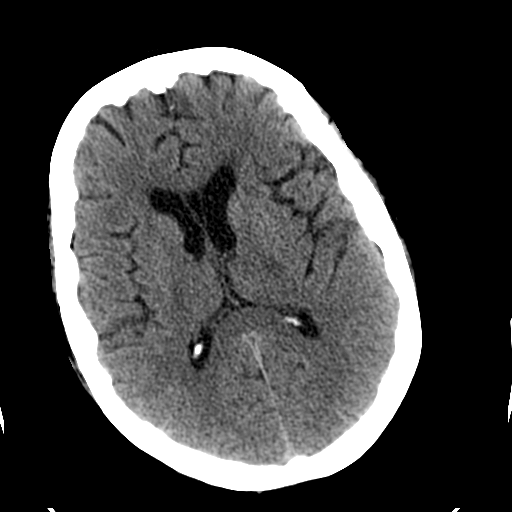
[im 19/36  brain]
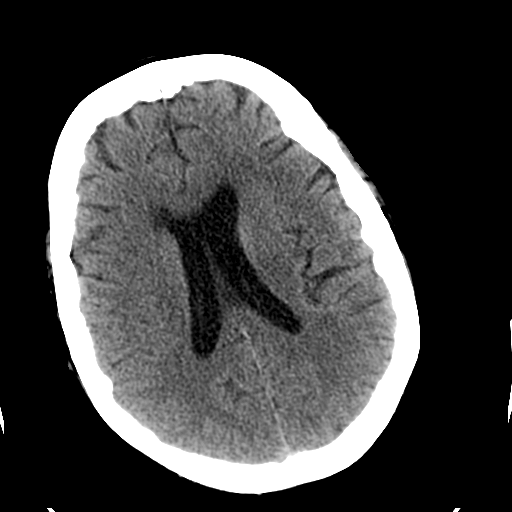
[im 19/36  bone]
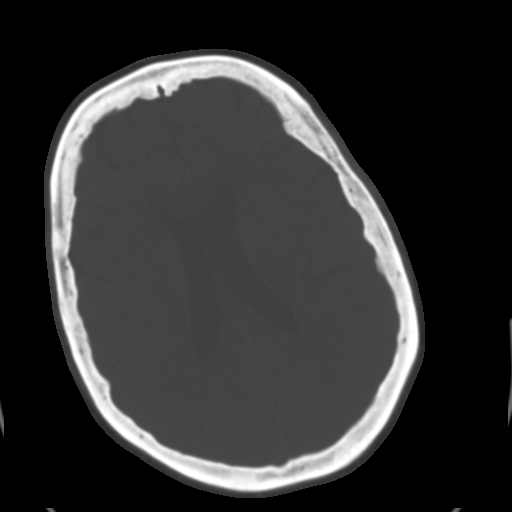
[im 21/36  brain]
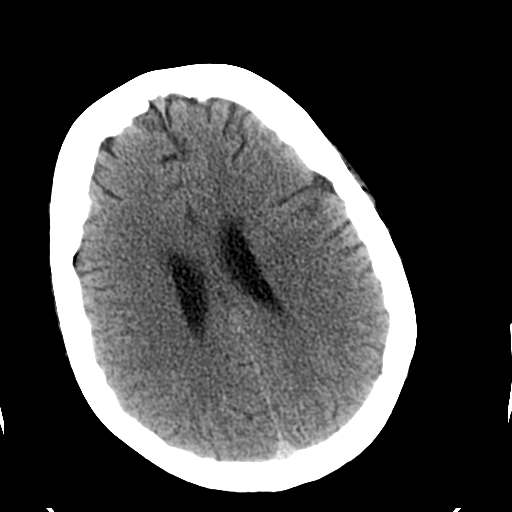
[im 23/36  brain]
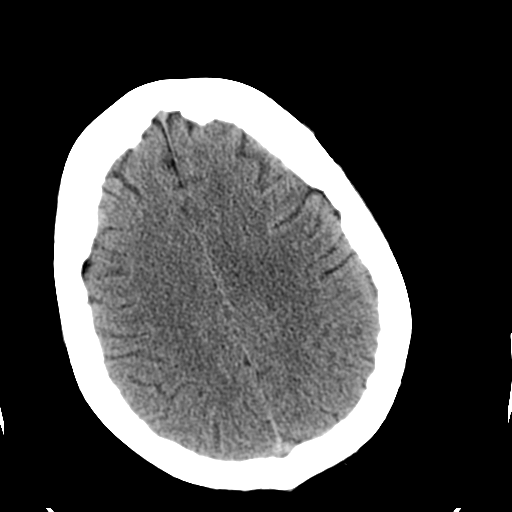
[im 26/36  brain]
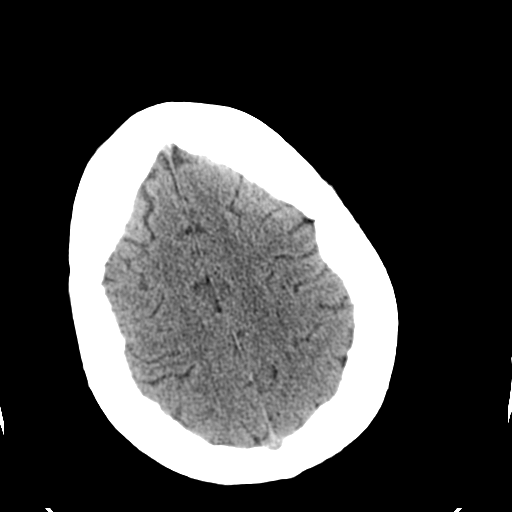
[im 27/36  brain]
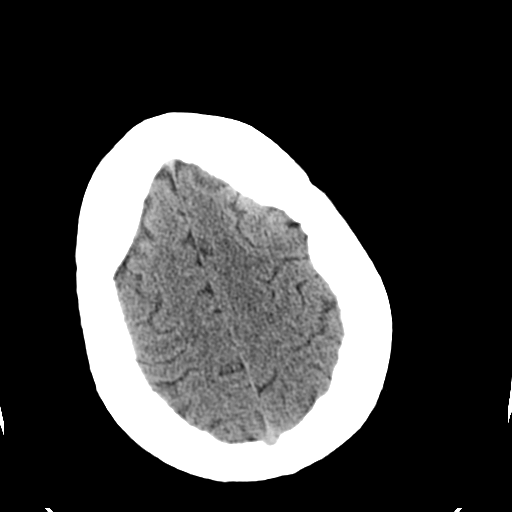
[im 27/36  bone]
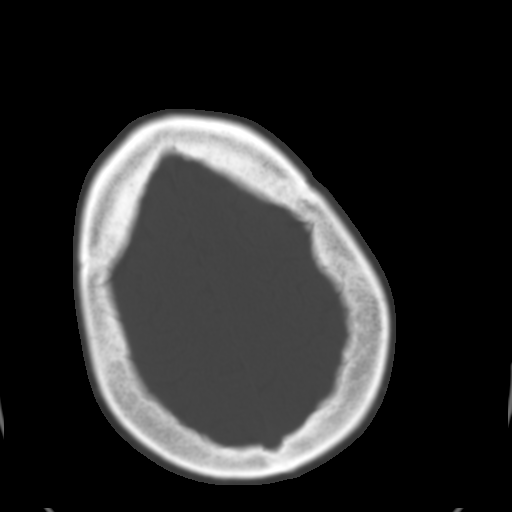
[im 29/36  brain]
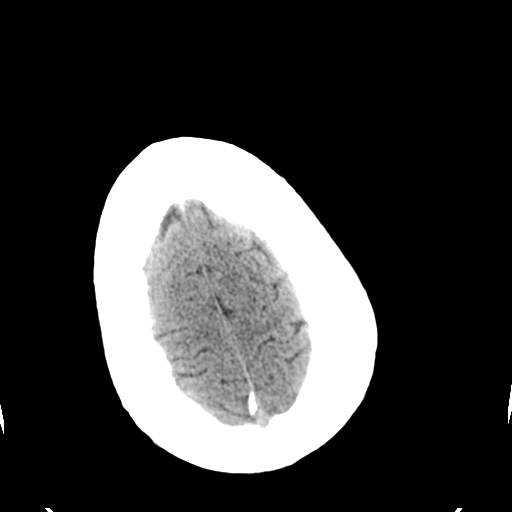
[im 32/36  brain]
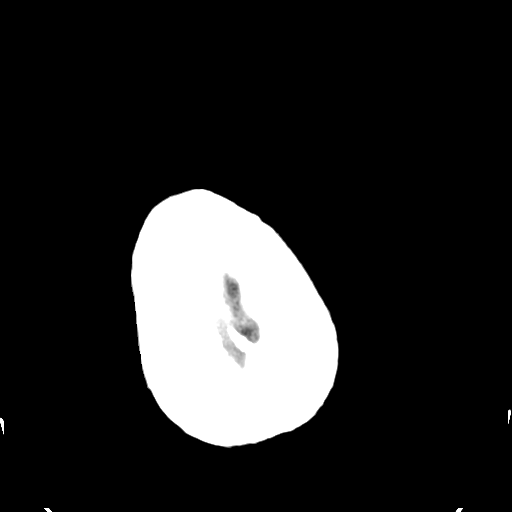
[im 34/36  brain]
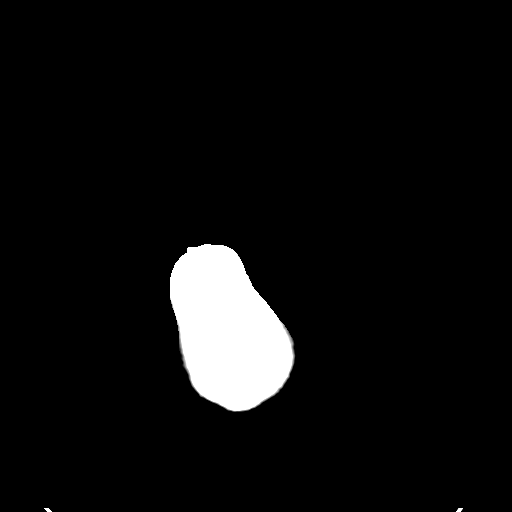

[16 of 30 positions shown; findings below may reference images not displayed]

FINDINGS: No evidence of acute intracranial abnormality including
acute infarction, hemorrhage, mass lesion, mass effect, midline
shift or abnormal extra-axial fluid collection.  No hydrocephalus.
Calvarium intact.  Tiny amount of fluid left mastoid air cells
noted.
IMPRESSION: No acute abnormality.

## 2010-01-24 IMAGING — CR DG HIP (WITH OR WITHOUT PELVIS) 2-3V*L*
3 series · 3 of 3 positions shown · non-contrast
Comparison: No similar prior study is available for comparison.

CLINICAL DATA: Fall, left hip pain

LEFT HIP - COMPLETE 2+ VIEW

[t hip ap left]
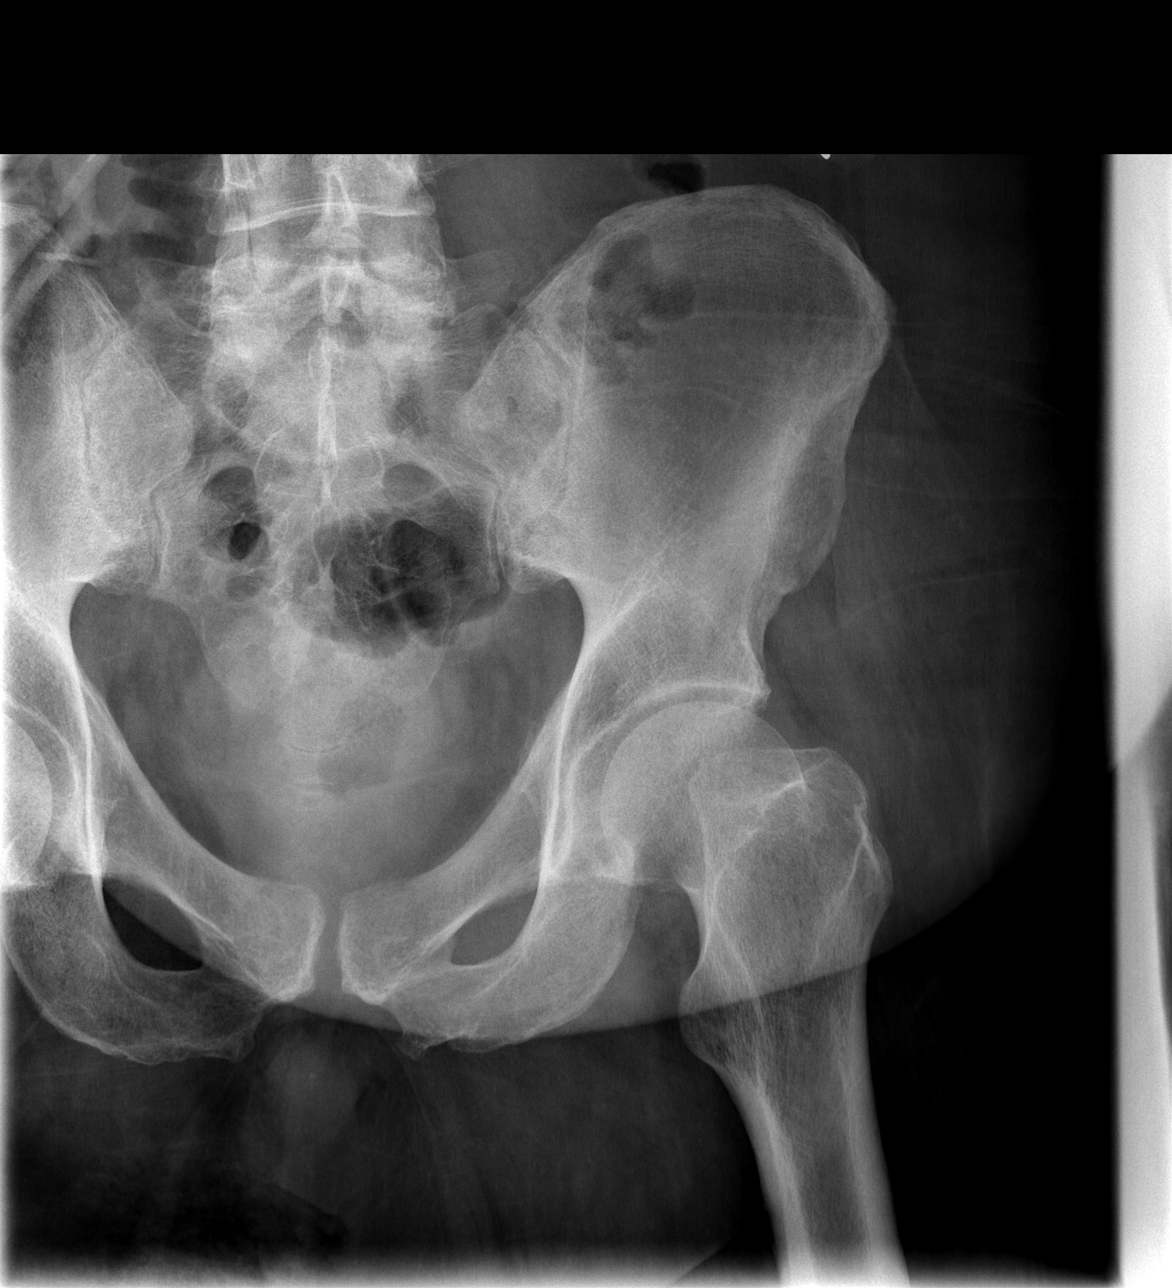

[t hip frog leg left]
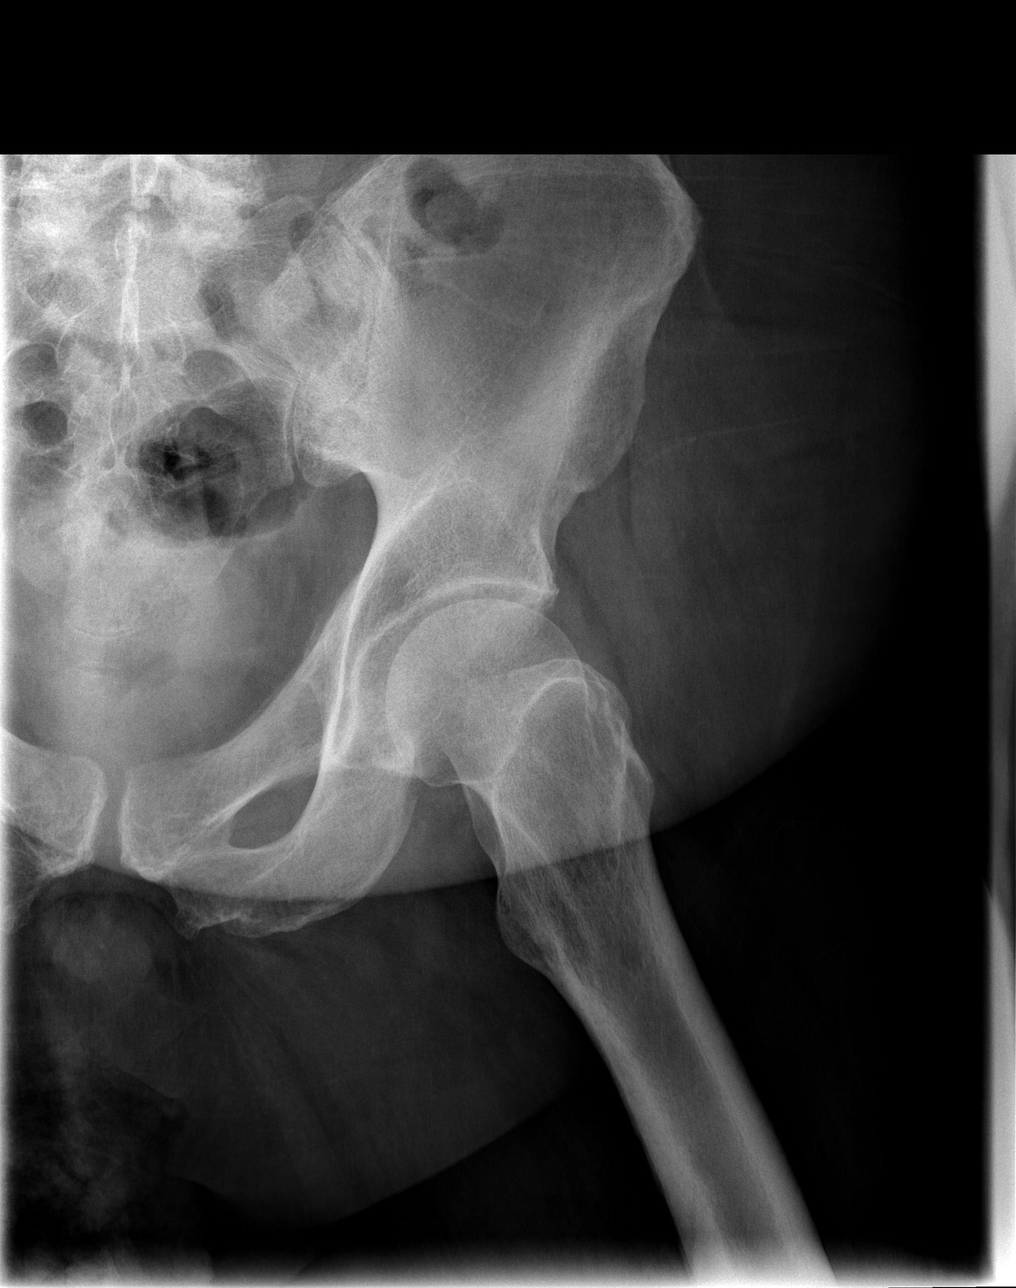

[t pelvis a.p.]
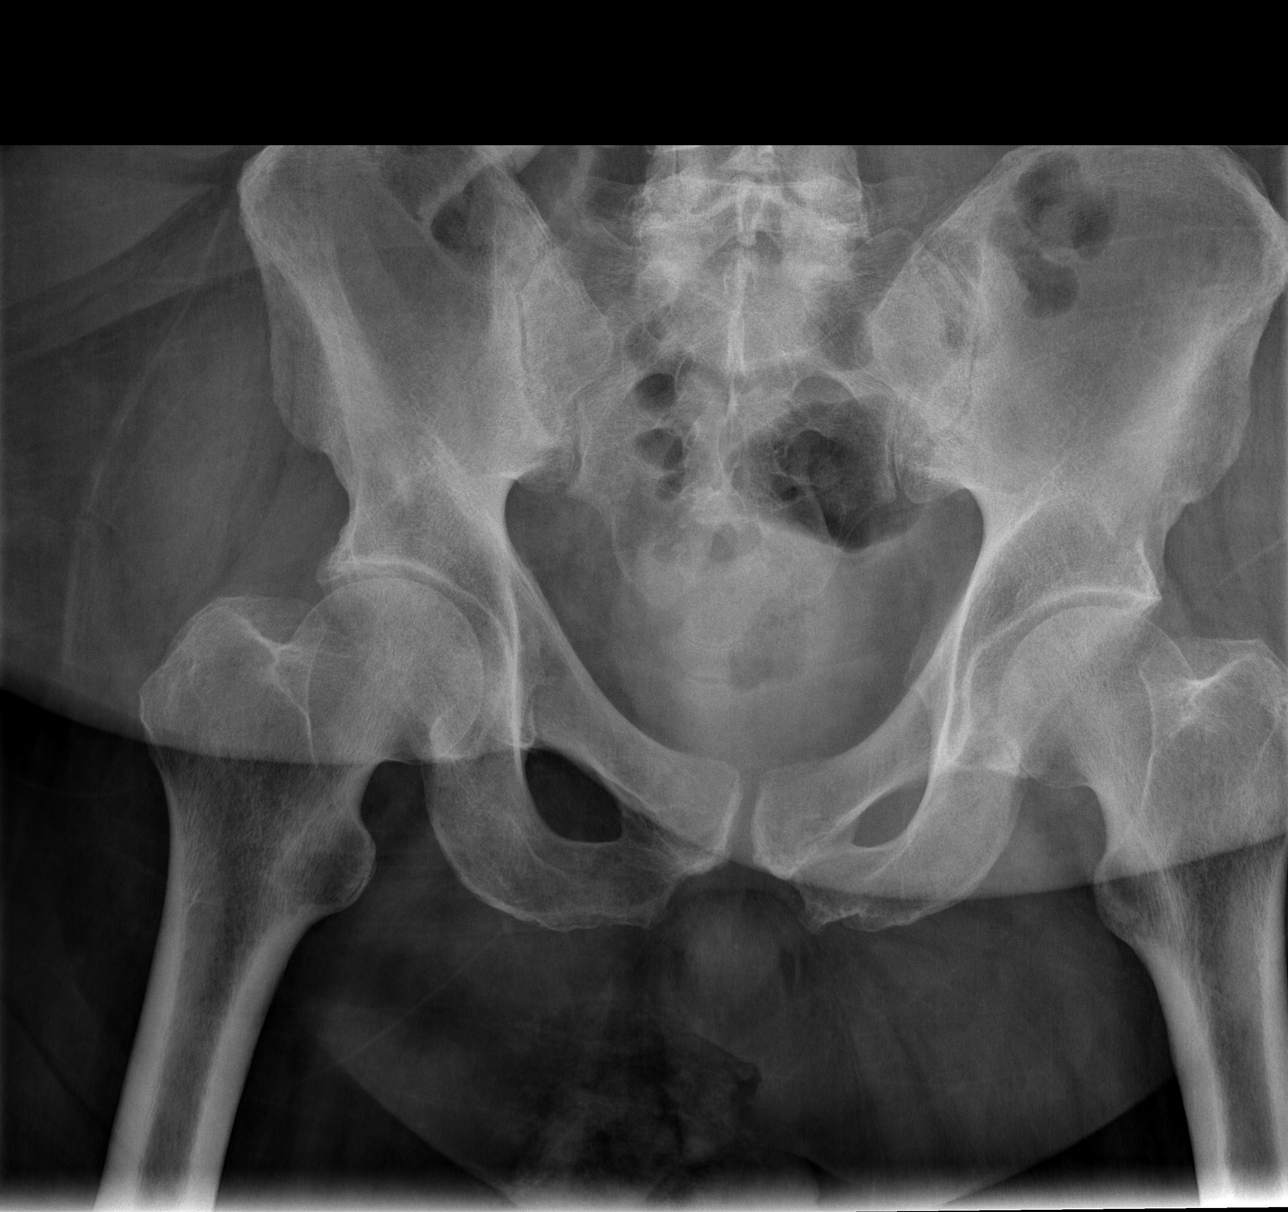

[3 of 3 positions shown; findings below may reference images not displayed]

FINDINGS: The patient could not be positioned for external rotation
views.  Allowing for this, no fracture or dislocation is seen.
IMPRESSION: Allowing for suboptimal positioning, no acute bony abnormality of
the left hip.

## 2010-01-26 ENCOUNTER — Inpatient Hospital Stay (HOSPITAL_COMMUNITY): Admission: EM | Admit: 2010-01-26 | Discharge: 2010-01-30 | Payer: Self-pay | Admitting: Emergency Medicine

## 2010-02-03 ENCOUNTER — Emergency Department (HOSPITAL_COMMUNITY): Admission: EM | Admit: 2010-02-03 | Discharge: 2010-02-03 | Payer: Self-pay | Admitting: Emergency Medicine

## 2010-02-12 ENCOUNTER — Emergency Department (HOSPITAL_COMMUNITY): Admission: EM | Admit: 2010-02-12 | Discharge: 2010-02-12 | Payer: Self-pay | Admitting: Emergency Medicine

## 2010-02-20 IMAGING — CR DG ABD PORTABLE 2V
1 series · 1 of 1 positions shown · non-contrast
Comparison: Pelvic radiograph performed 12/22/2009

CLINICAL DATA: Concern for abdominal infection.

ABDOMEN - 2 VIEW

[view not recorded]
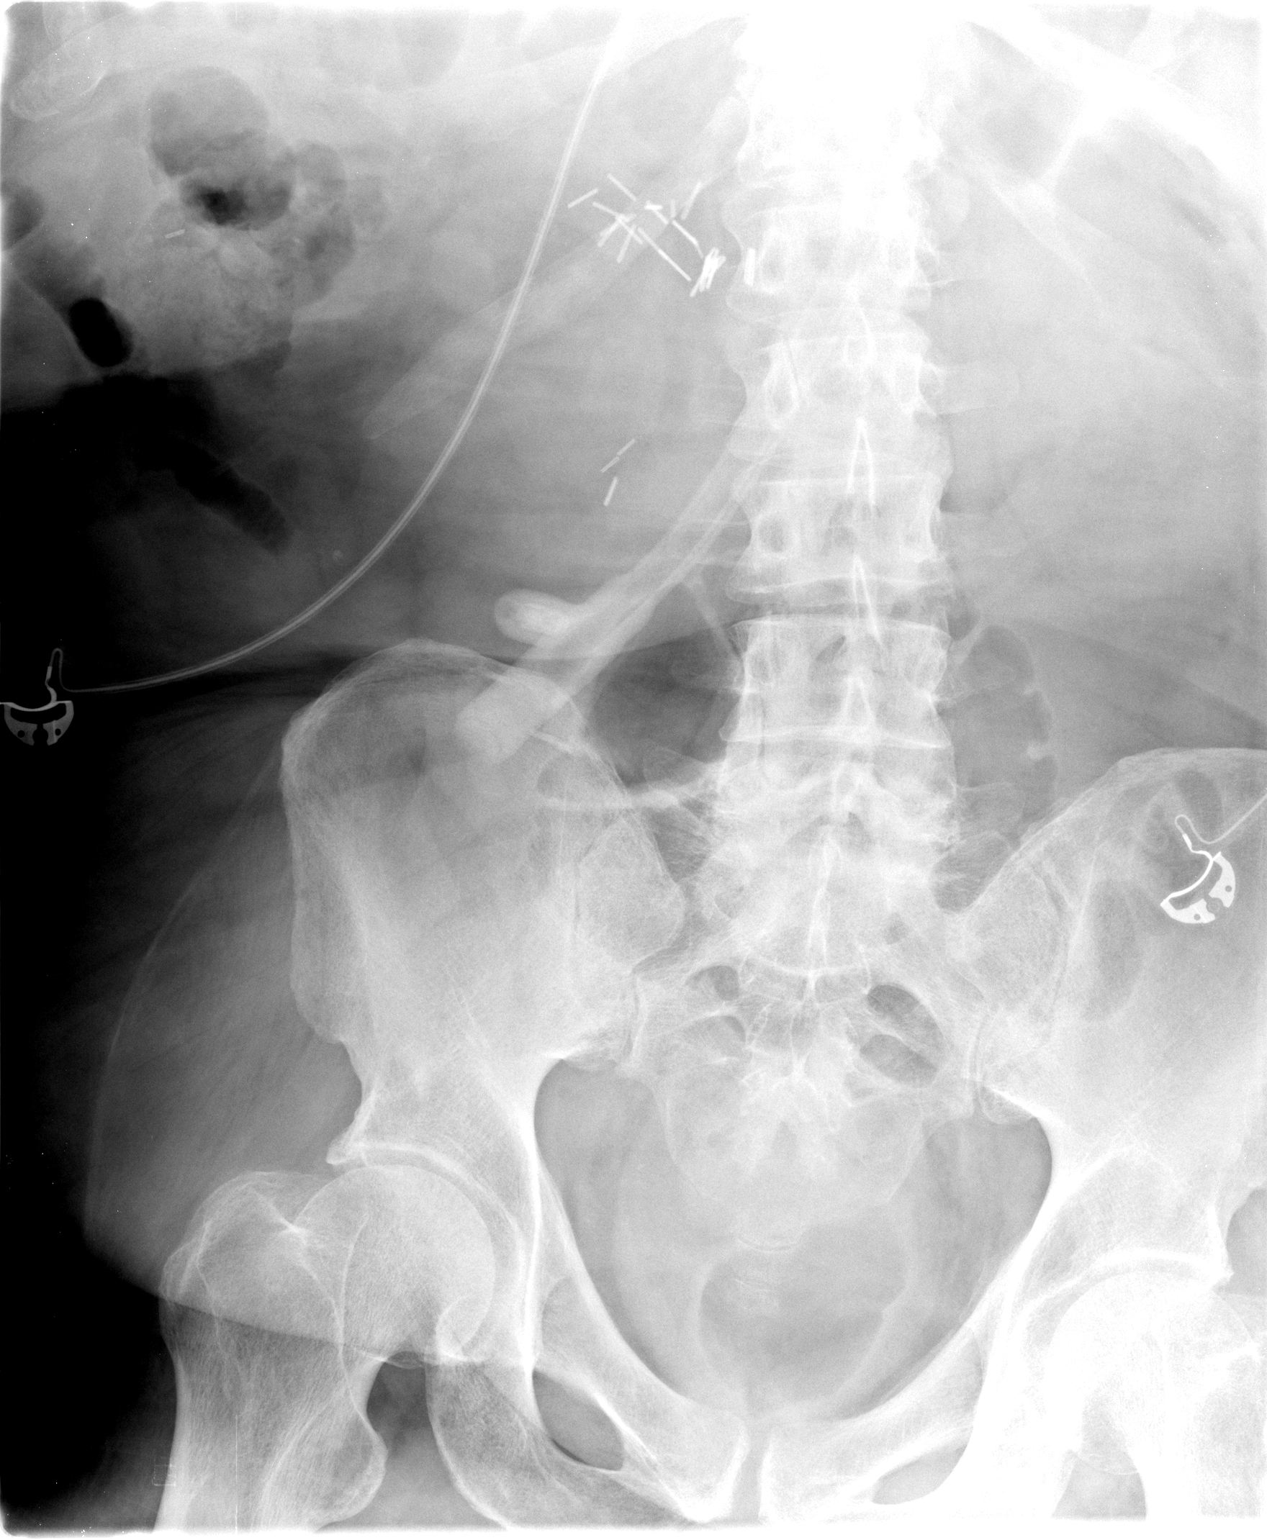

[1 of 1 positions shown; findings below may reference images not displayed]

FINDINGS: The visualized bowel gas pattern is unremarkable.
Scattered air and stool filled loops of colon are seen; no abnormal
dilatation of small bowel loops is seen to suggest small bowel
obstruction.  No free intra-abdominal air is identified, although
evaluation for free air is limited on a single supine view.

The visualized osseous structures are within normal limits; the
sacroiliac joints are unremarkable in appearance. Scattered clips
are noted at the right mid abdomen.  A likely gastric tube is noted
overlying the patient, unchanged from the prior study.
IMPRESSION: Unremarkable bowel gas pattern; no definite free intra-abdominal
air seen.

## 2010-02-20 IMAGING — CR DG CHEST 1V PORT
1 series · 1 of 1 positions shown · non-contrast
Comparison: Chest radiograph performed 12/22/2009

CLINICAL DATA: Chest pain; concern for infection.

PORTABLE CHEST - 1 VIEW

[view not recorded]
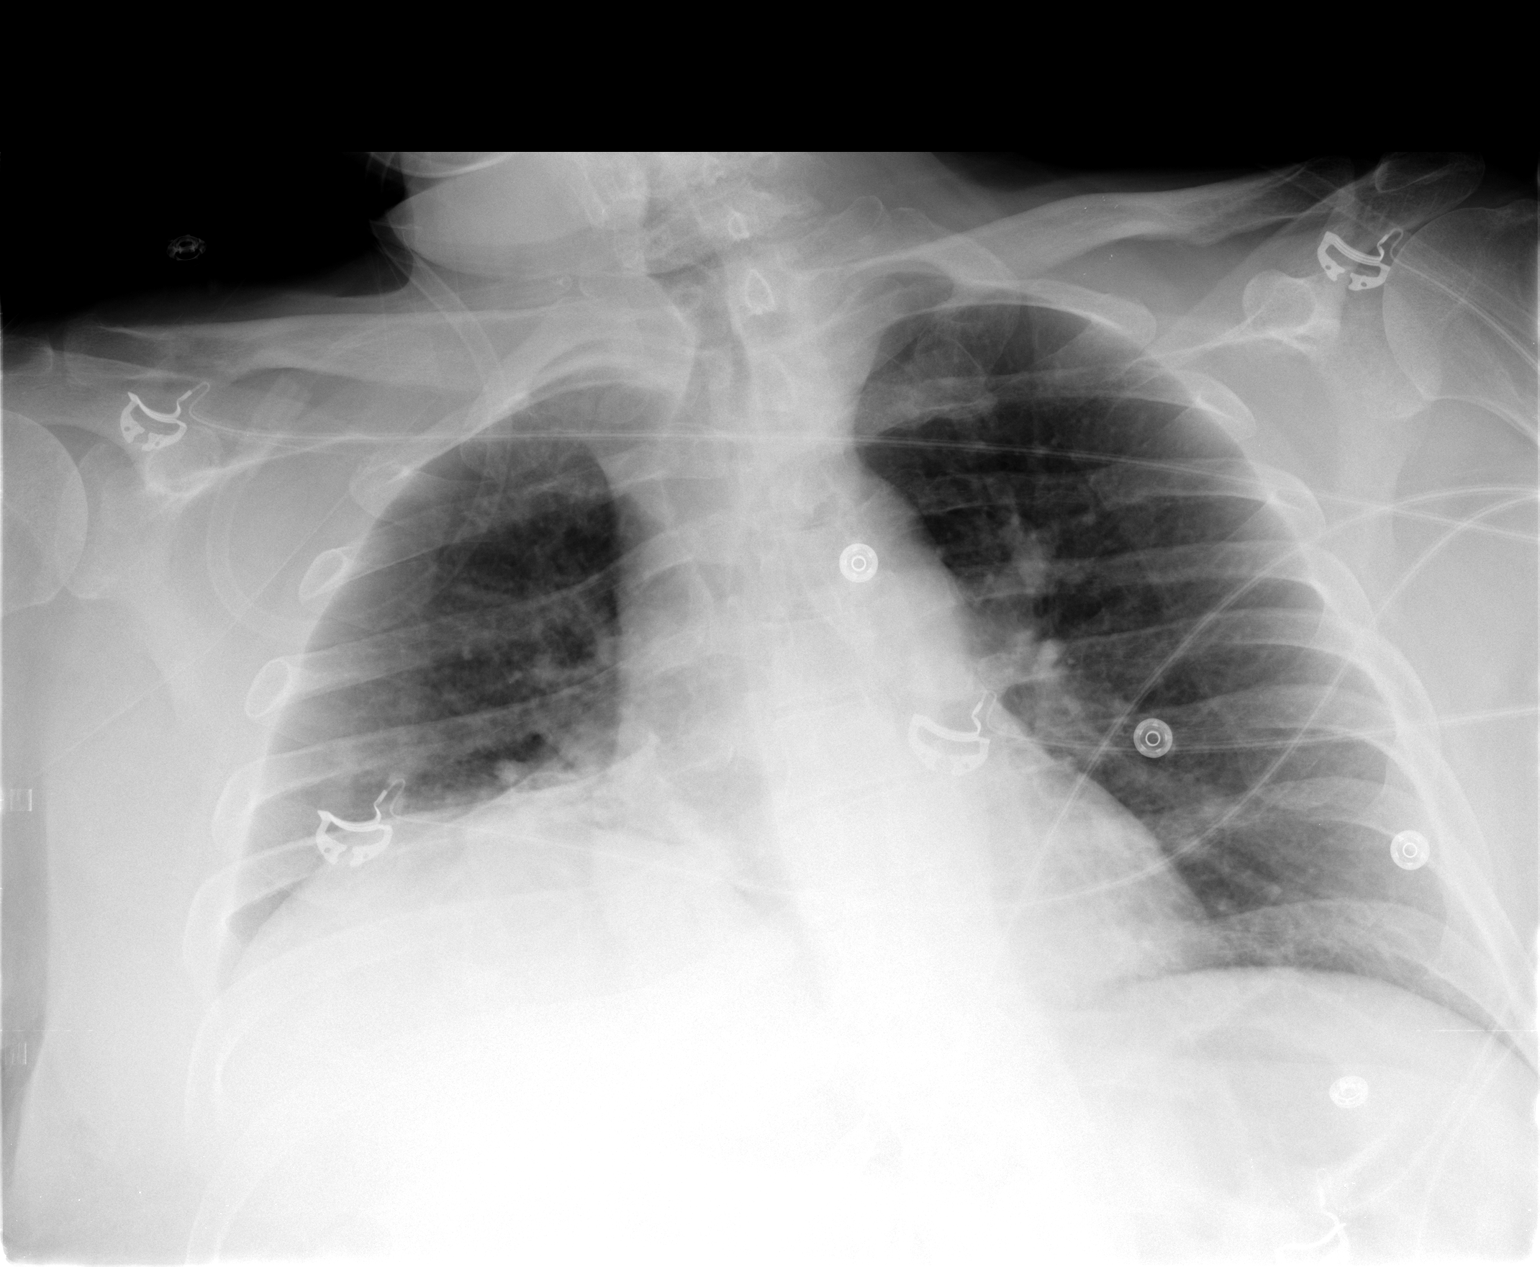

[1 of 1 positions shown; findings below may reference images not displayed]

FINDINGS: There is persistent elevation of the right hemidiaphragm;
mild patchy right basilar opacity is noted, new from prior study
and possibly reflecting atelectasis or pneumonia.  The left lung
appears essentially clear.  No pleural effusion or pneumothorax is
seen.  The left costophrenic angle is incompletely imaged on this
study.

The cardiomediastinal silhouette is within normal limits.  No acute
osseous abnormalities are identified.
IMPRESSION: Mild patchy right basilar opacity may reflect atelectasis or
pneumonia.

## 2010-02-21 IMAGING — US US RENAL PORT
1 series · 14 of 25 positions shown · non-contrast
Comparison: 11/19/2009

CLINICAL DATA: Urinary tract infection.  Urosepsis.

RENAL/URINARY TRACT ULTRASOUND COMPLETE

[Series 1: us renal port · 0.40mm/px · 31 acquisitions, 14 frames shown]
[im 1/31]
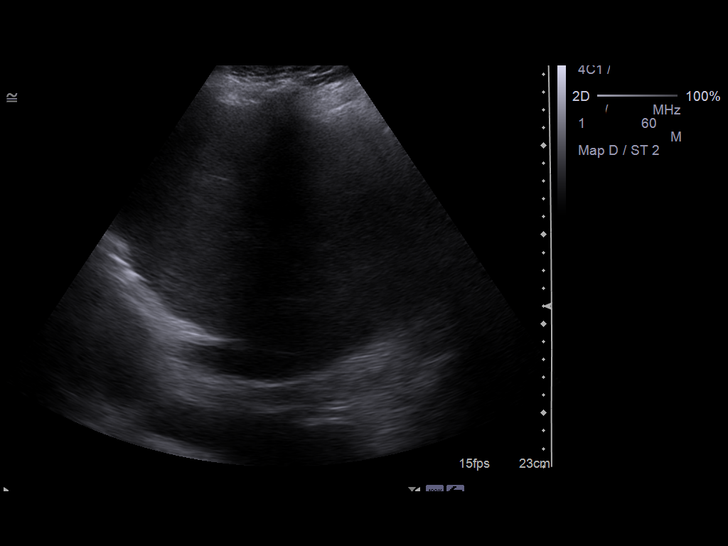
[im 3/31]
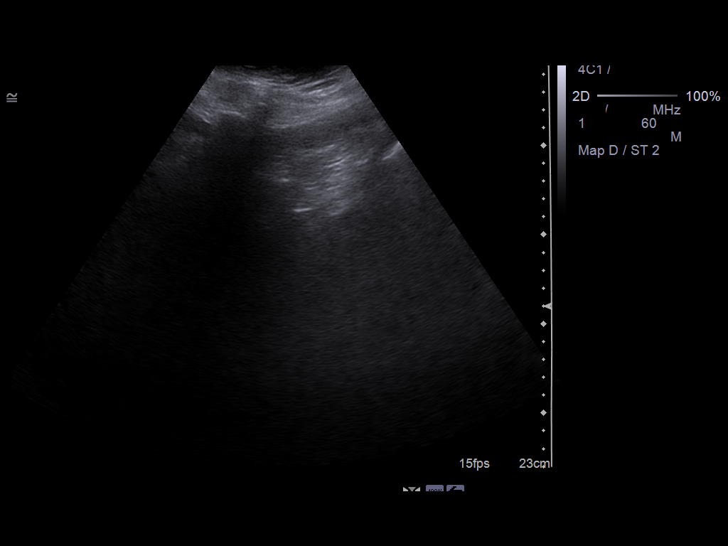
[im 6/31]
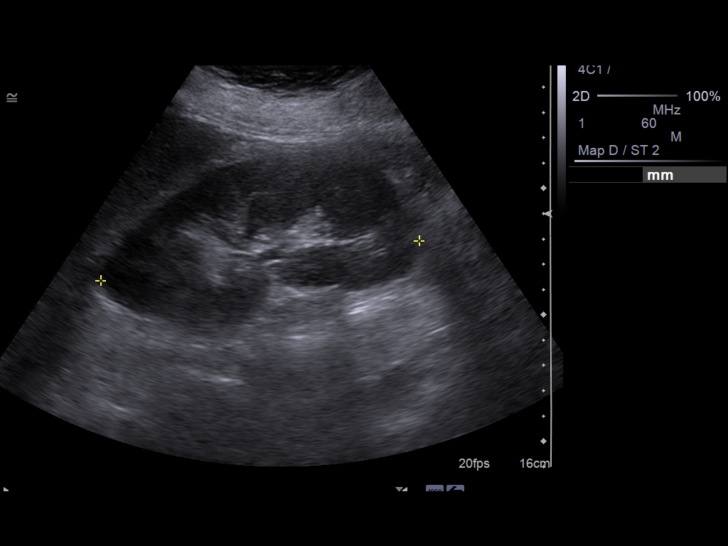
[im 8/31]
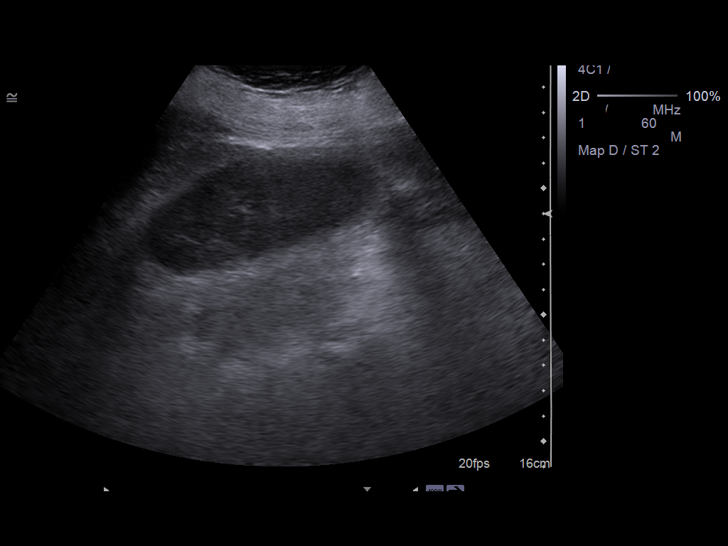
[im 11/31]
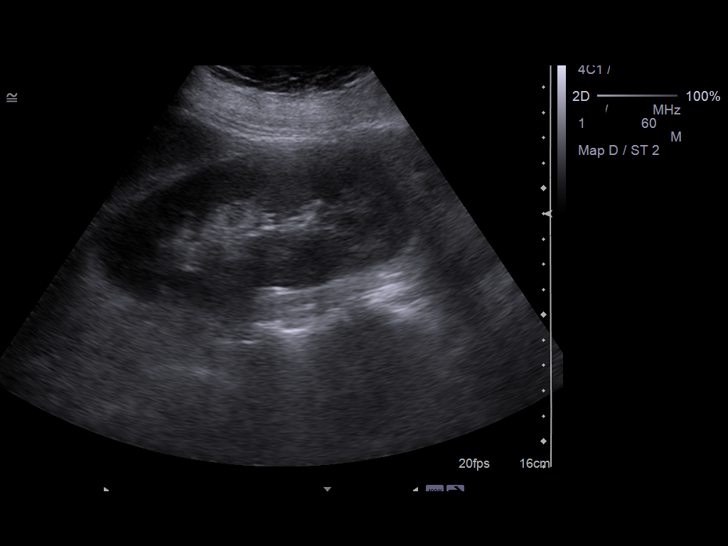
[im 12/31]
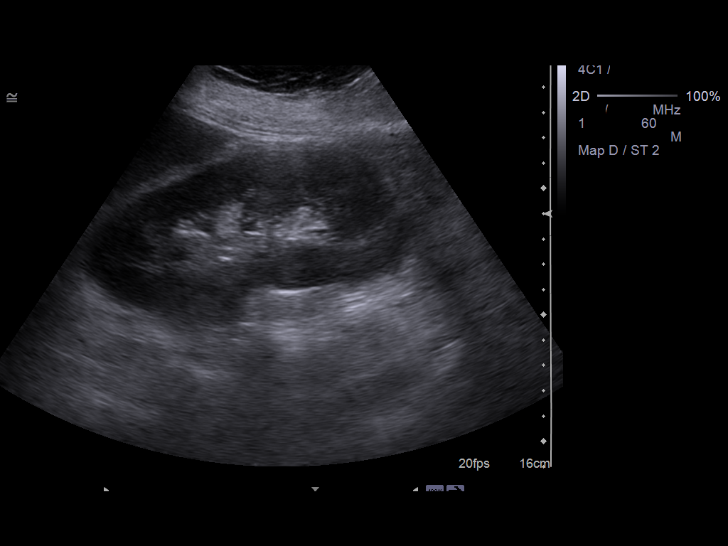
[im 14/31]
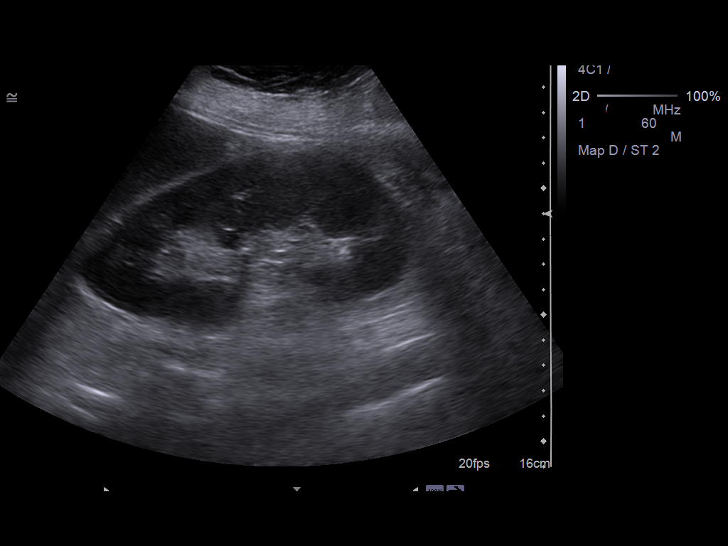
[im 17/31]
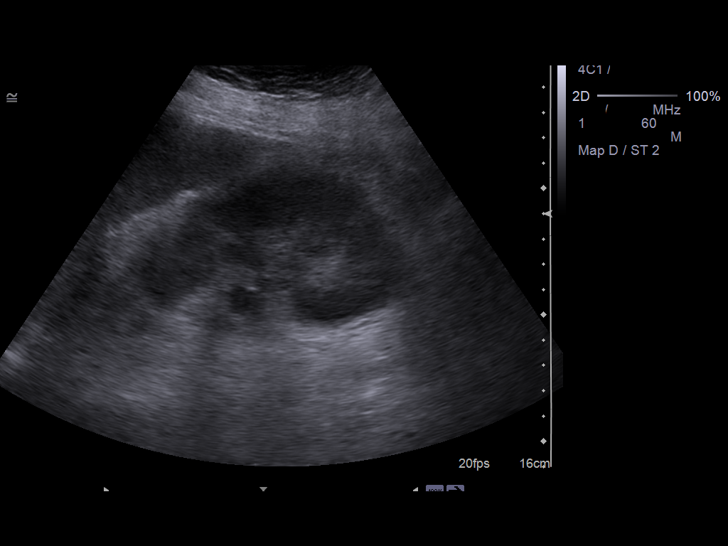
[im 19/31]
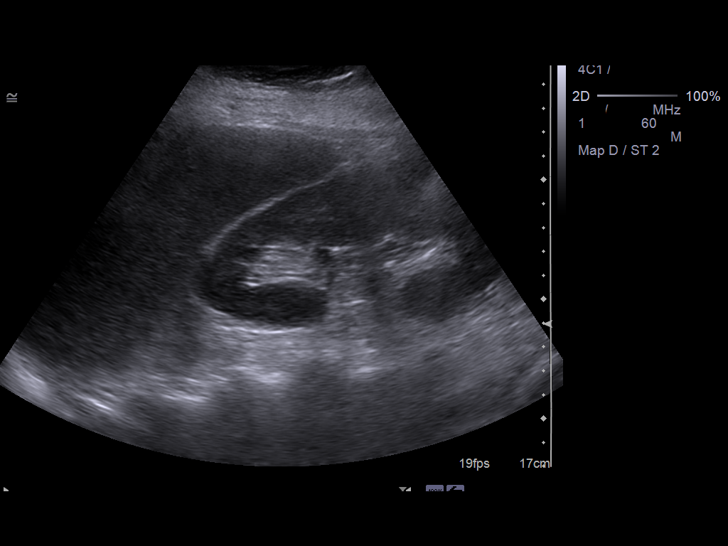
[im 21/31]
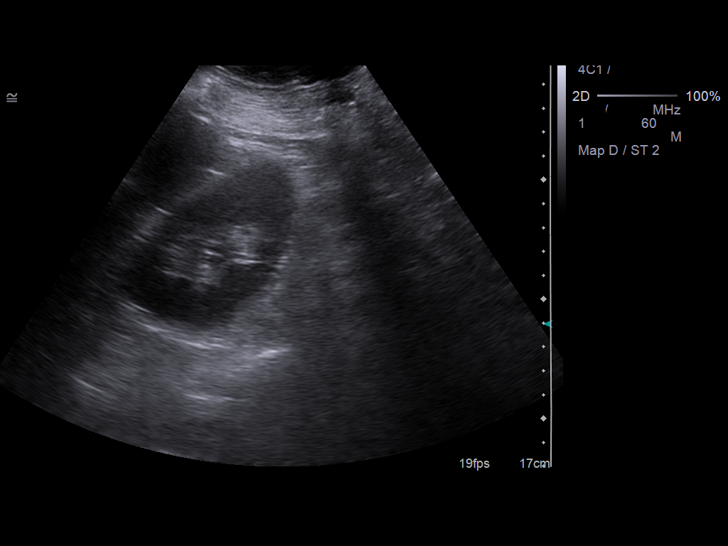
[im 23/31]
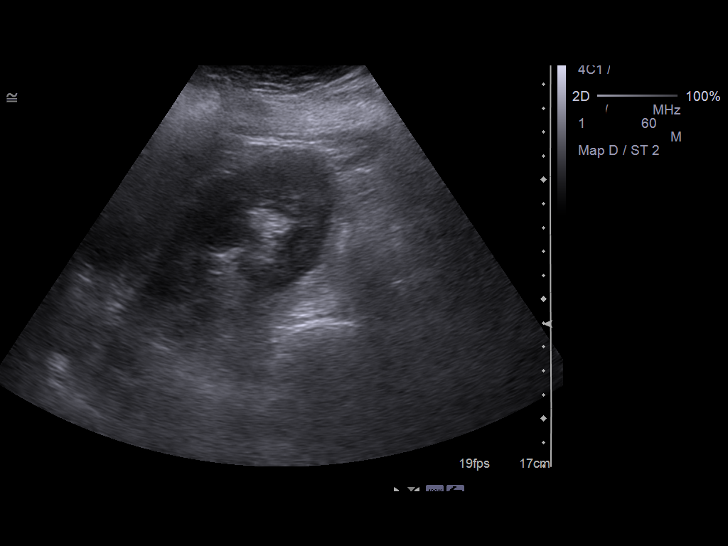
[im 26/31]
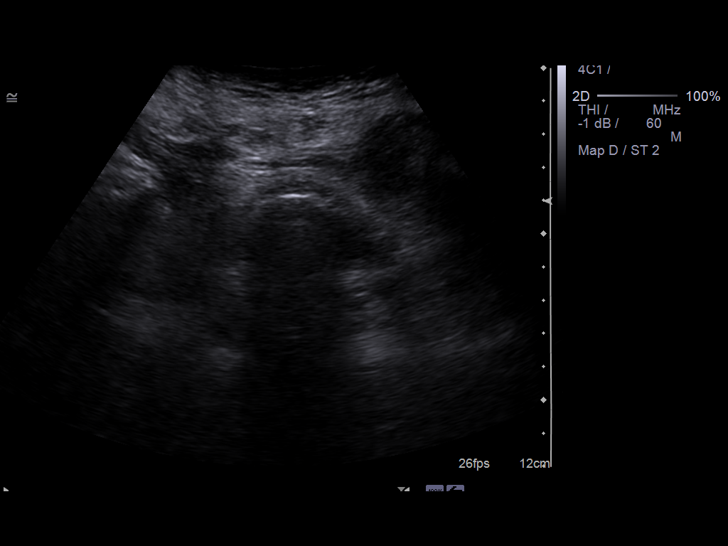
[im 28/31]
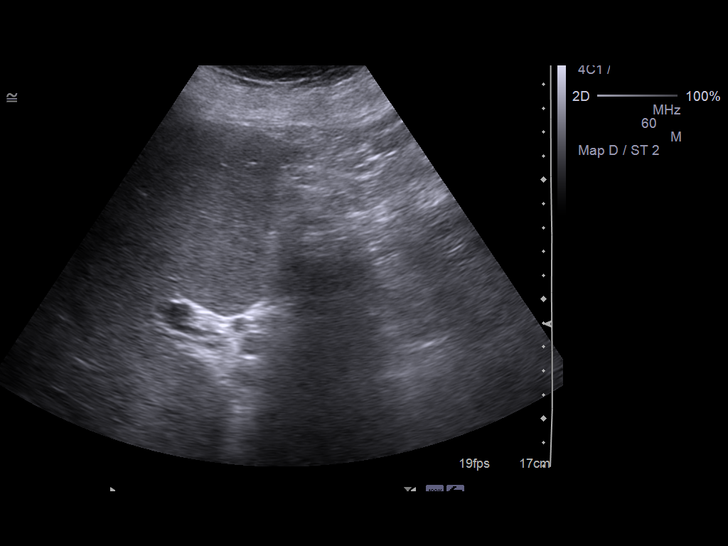
[im 31/31]
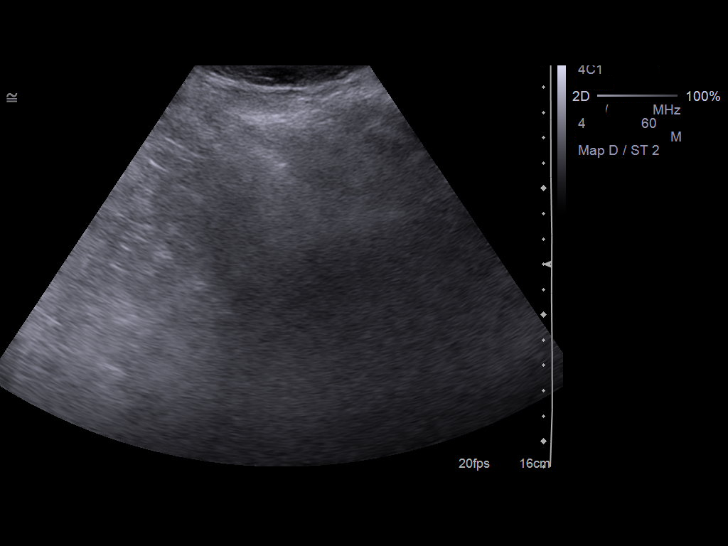

[14 of 25 positions shown; findings below may reference images not displayed]

FINDINGS: Right Kidney:  Not visualized

Left Kidney:  Normal in size and parenchymal echogenicity. Measures
12.7 cm length. No evidence of mass or hydronephrosis.

Bladder:  Empty with Foley catheter in place.
IMPRESSION: 1.  Normal appearance of left kidney.  No evidence of
hydronephrosis.
2.  Nonvisualization of right kidney.  Recommend correlation with
previous surgical history.

## 2010-02-21 IMAGING — CT CT HEAD W/O CM
1 series · 16 of 30 positions shown, 20 images · non-contrast
Comparison: 12/22/2009

CLINICAL DATA: Pneumonia.  Sepsis.  Altered mental status.

CT HEAD WITHOUT CONTRAST
TECHNIQUE: Contiguous axial images were obtained from the base of
the skull through the vertex without contrast.

[Series 2: head routine 4.8 h37s · axial · 0.48mm/px · z∈[+484,+645]mm · 16 of 36 slices shown, 20 images]
[im 2/36  brain]
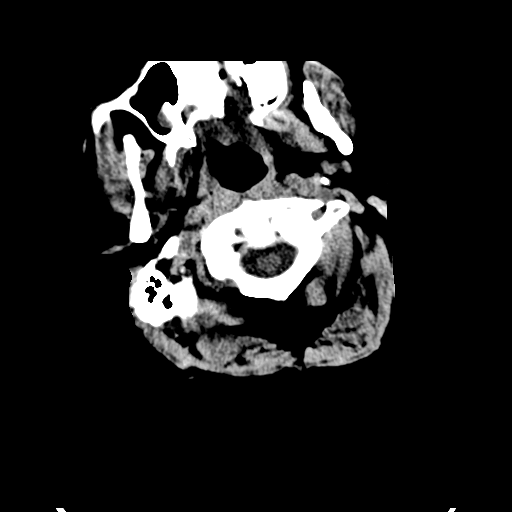
[im 2/36  bone]
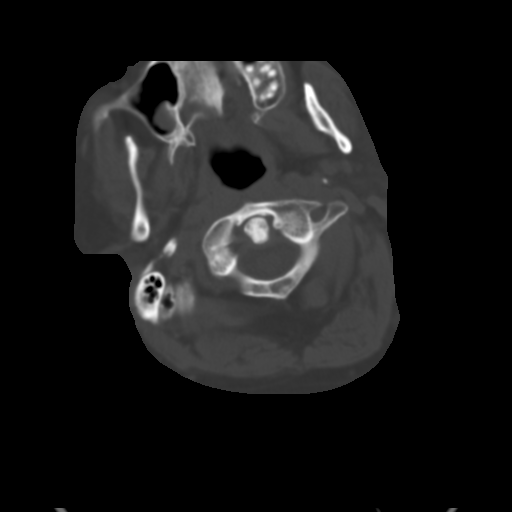
[im 4/36  brain]
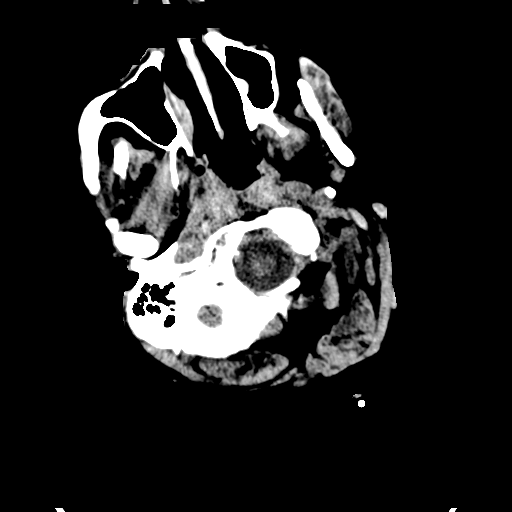
[im 7/36  brain]
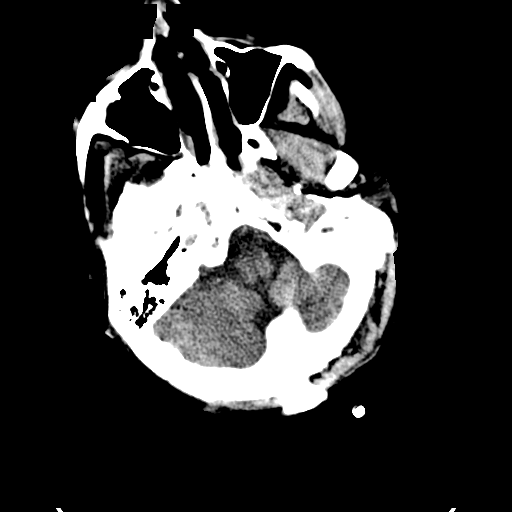
[im 9/36  brain]
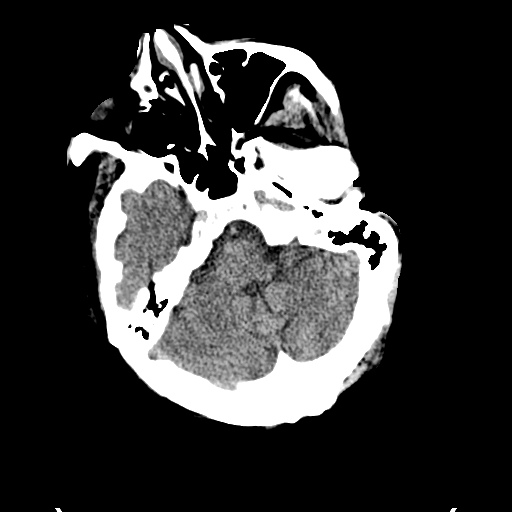
[im 10/36  brain]
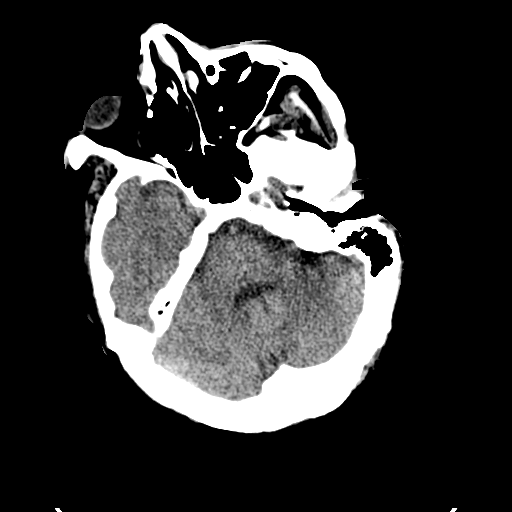
[im 10/36  bone]
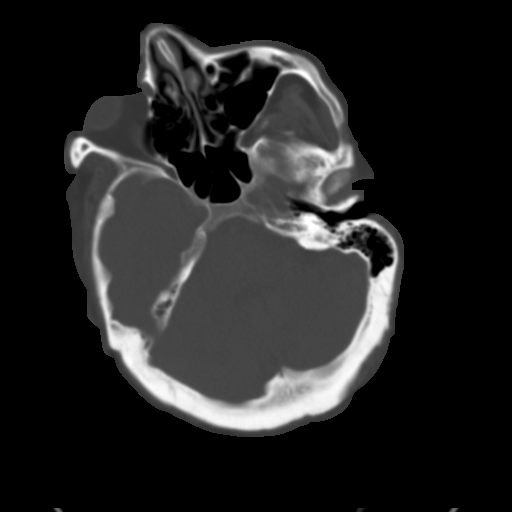
[im 13/36  brain]
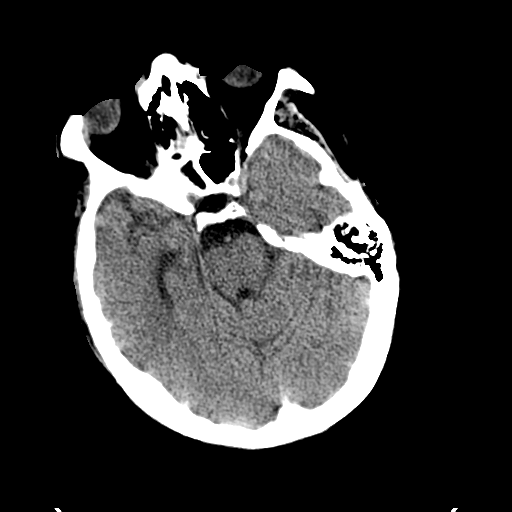
[im 15/36  brain]
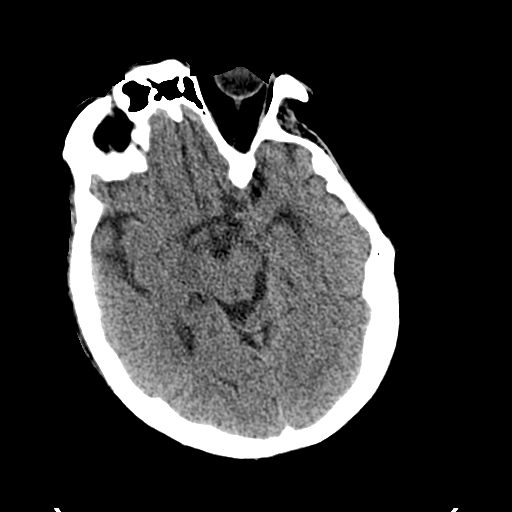
[im 17/36  brain]
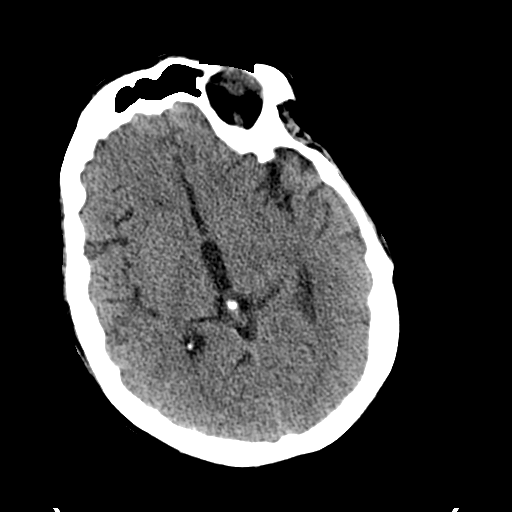
[im 19/36  brain]
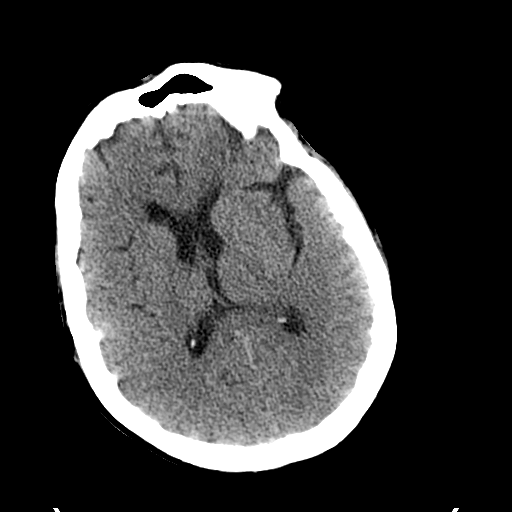
[im 19/36  bone]
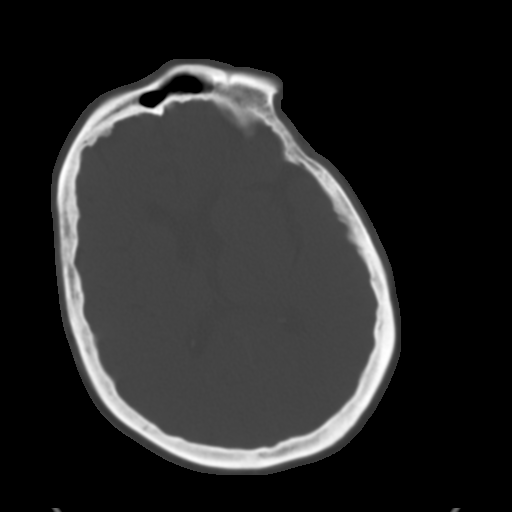
[im 21/36  brain]
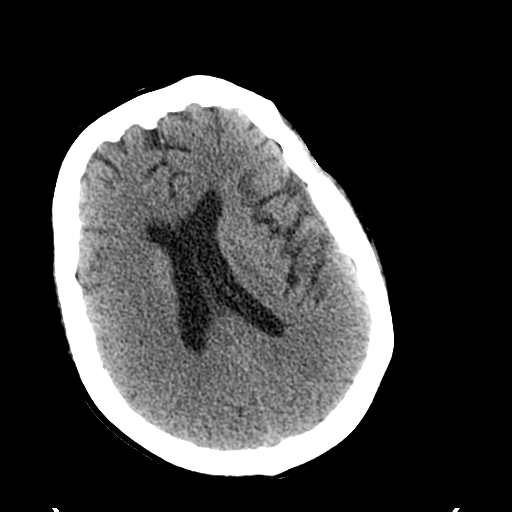
[im 23/36  brain]
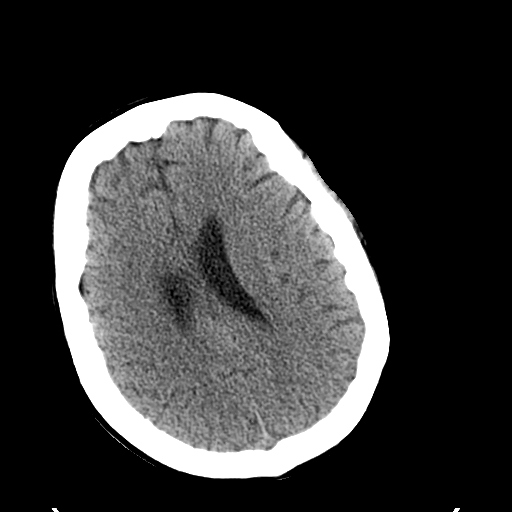
[im 26/36  brain]
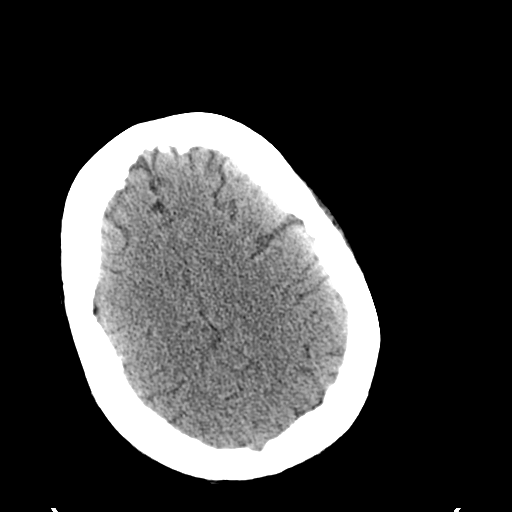
[im 27/36  brain]
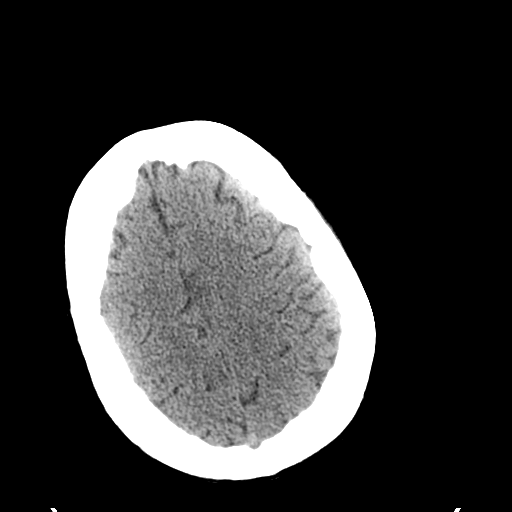
[im 27/36  bone]
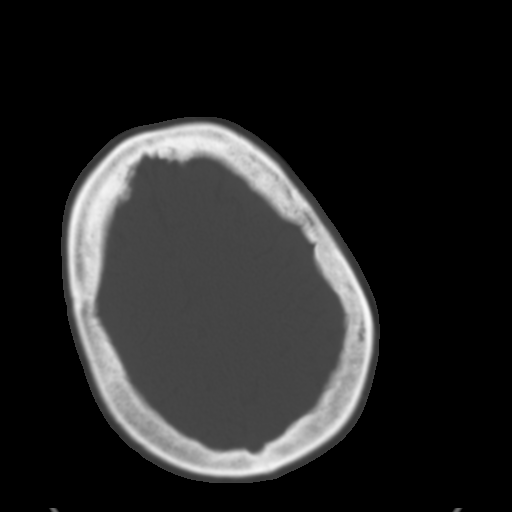
[im 29/36  brain]
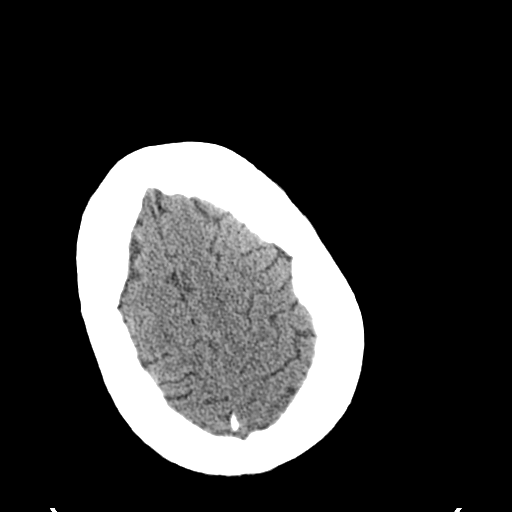
[im 32/36  brain]
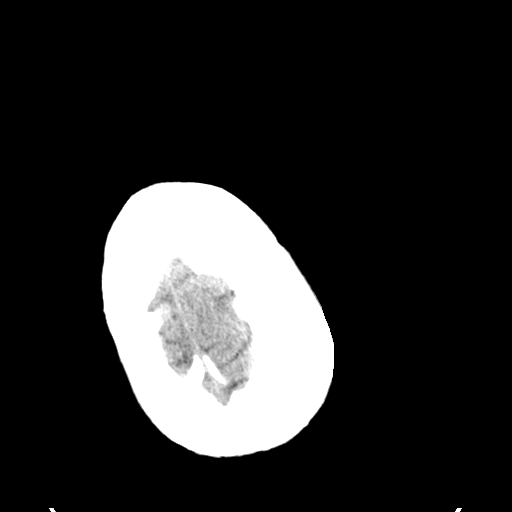
[im 34/36  brain]
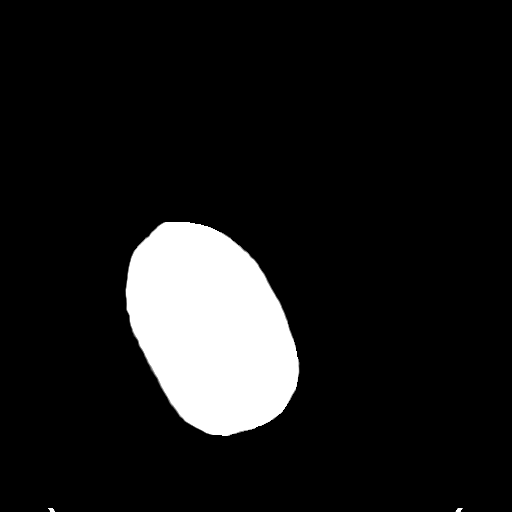

[16 of 30 positions shown; findings below may reference images not displayed]

FINDINGS: The brain stem, cerebellum, cerebral peduncles, thalami,
basal ganglia, basilar cisterns, and ventricular system appear
unremarkable.

No intracranial hemorrhage, mass lesion, or acute infarction is
identified.

There is polypoid mucoperiosteal thickening in the maxillary
sinuses.  There appears to be hole or defect in the nasal septum
anteriorly, similar to the prior exam.
IMPRESSION: 1.  Chronic sinusitis.
2.  Otherwise negative.

## 2010-02-21 IMAGING — CR DG CHEST 1V PORT
1 series · 1 of 1 positions shown · non-contrast
Comparison: 01/18/2010

CLINICAL DATA: Central line placement.

PORTABLE CHEST - 1 VIEW

[view not recorded]
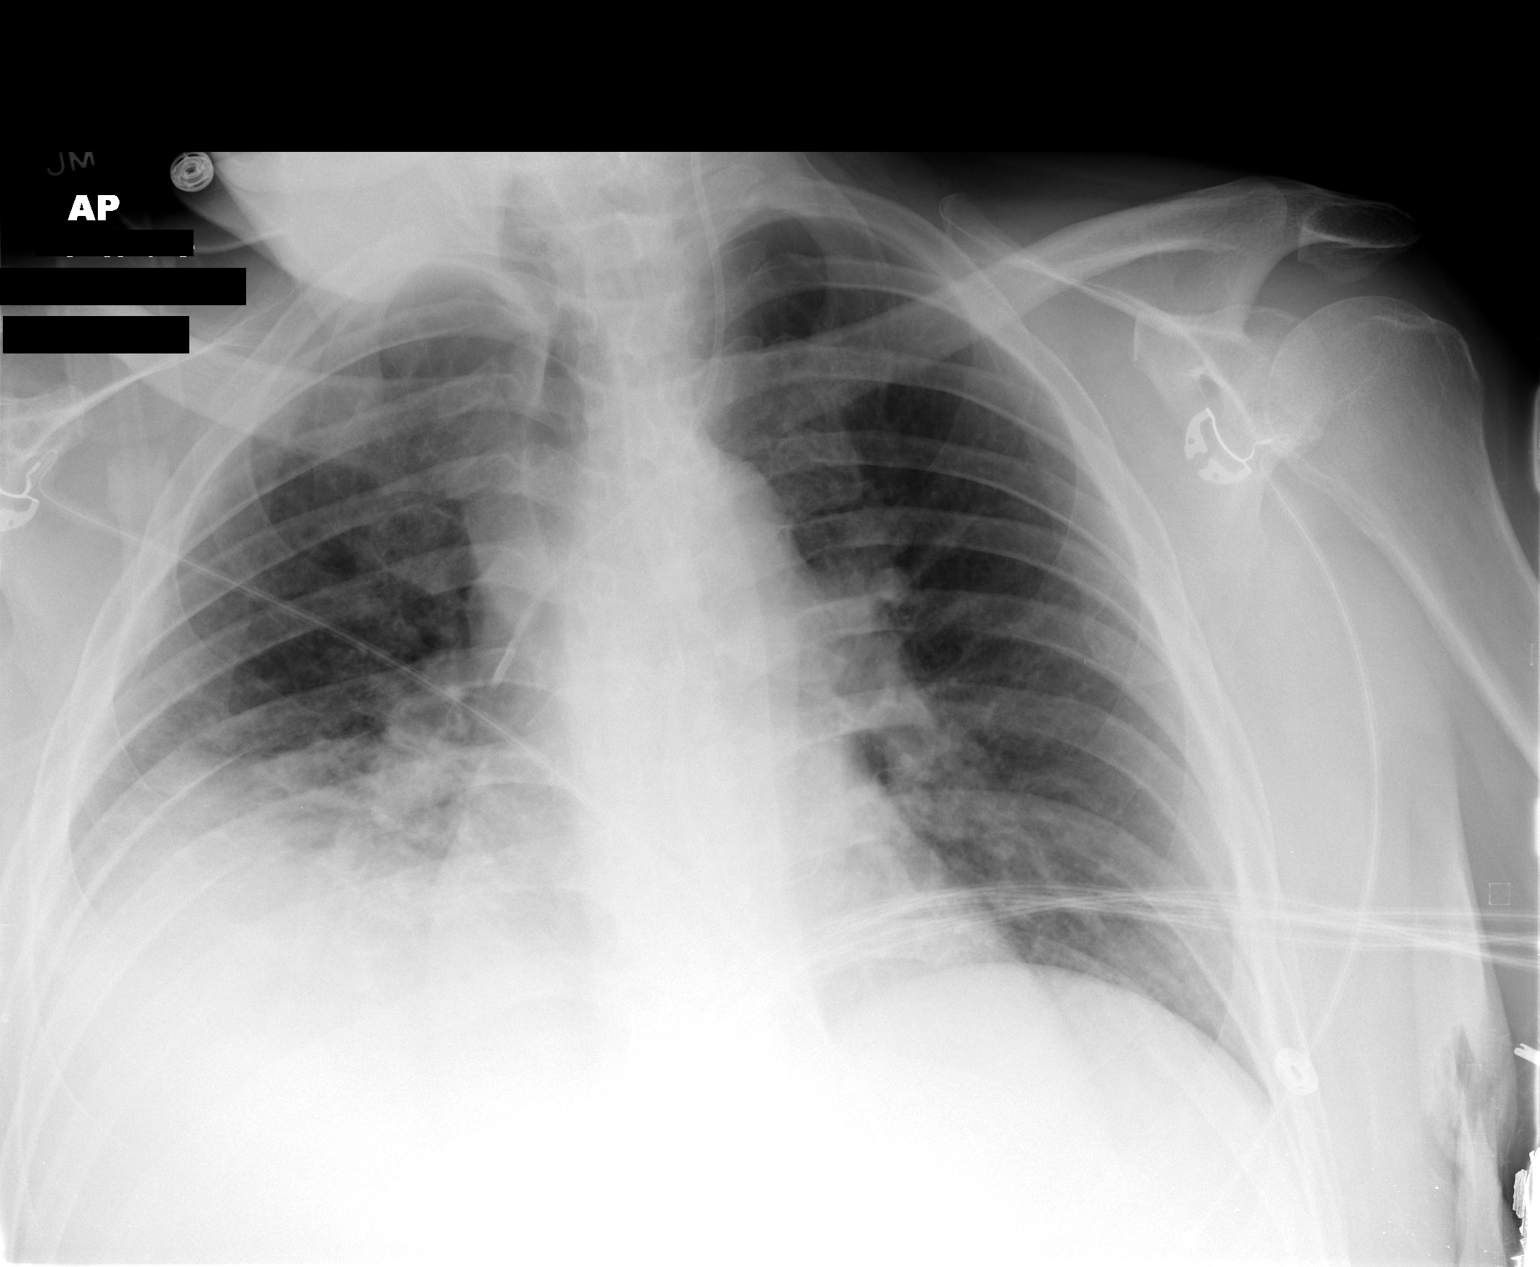

[1 of 1 positions shown; findings below may reference images not displayed]

FINDINGS: Left internal jugular central venous catheter projects
over the superior vena cava.

The patient is rotated to the right on today's exam, resulting in
reduced diagnostic sensitivity and specificity.  There is airspace
opacity the right lung base potentially representing pneumonia or
atelectasis.
IMPRESSION: 1.  Left IJ line tip:  SVC.
2.  Persistent right basilar airspace opacity compatible with
pneumonia or atelectasis.

## 2010-02-23 ENCOUNTER — Emergency Department (HOSPITAL_COMMUNITY): Admission: EM | Admit: 2010-02-23 | Discharge: 2010-02-23 | Payer: Self-pay | Admitting: Emergency Medicine

## 2010-02-24 IMAGING — CR DG FOOT COMPLETE 3+V*L*
3 series · 3 of 3 positions shown · non-contrast
Comparison: None available.

CLINICAL DATA: Fall, pain and bruising.

LEFT FOOT - COMPLETE 3+ VIEW

[AP]
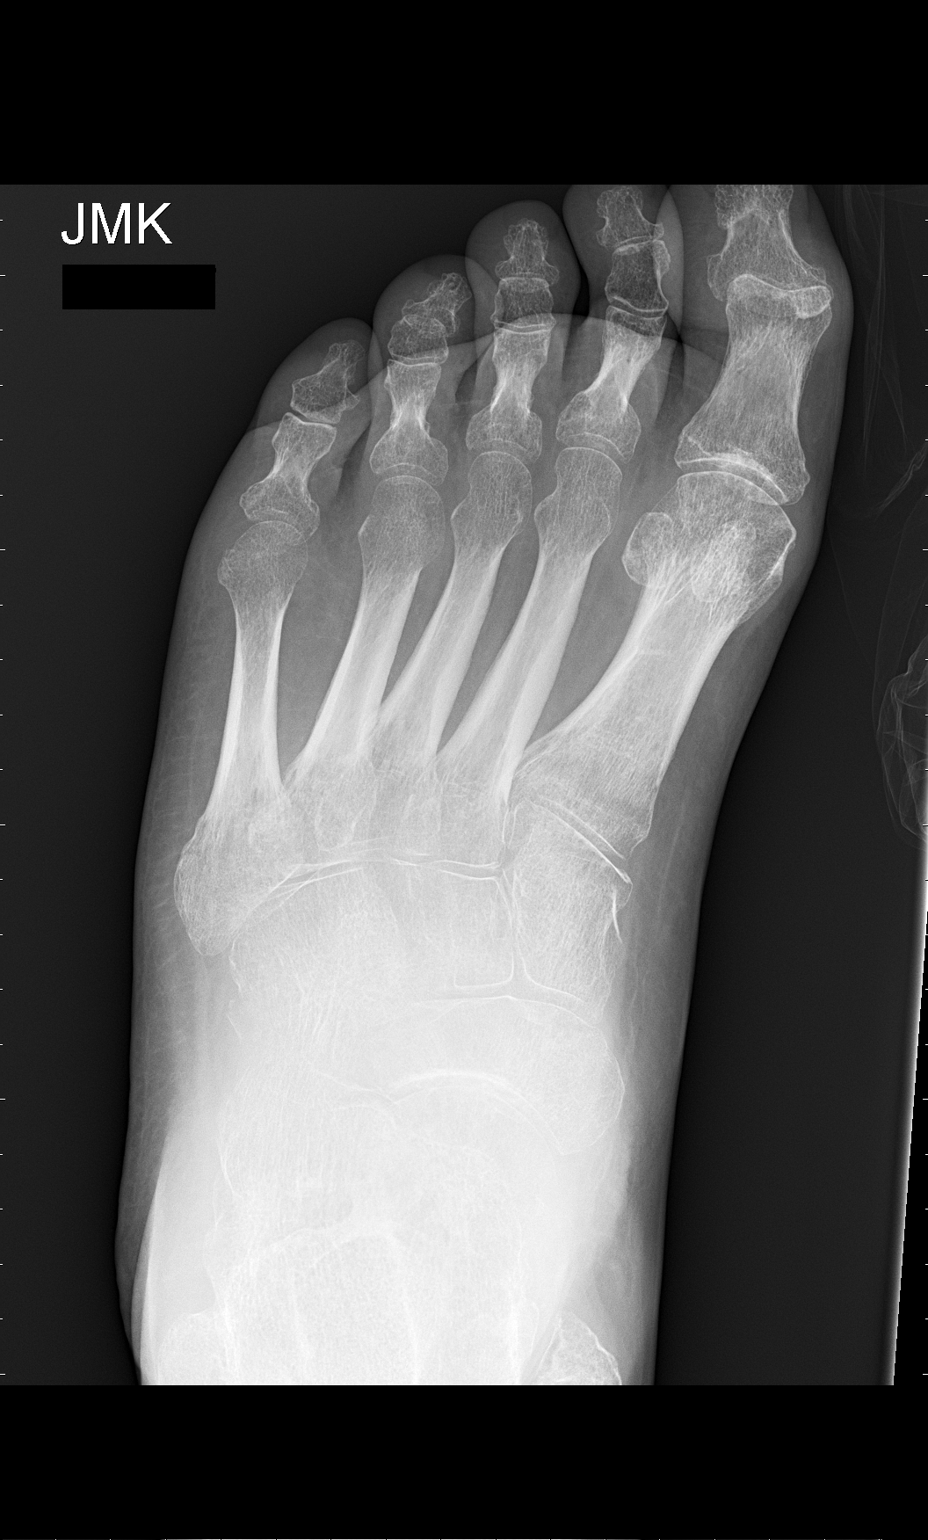

[foot obl]
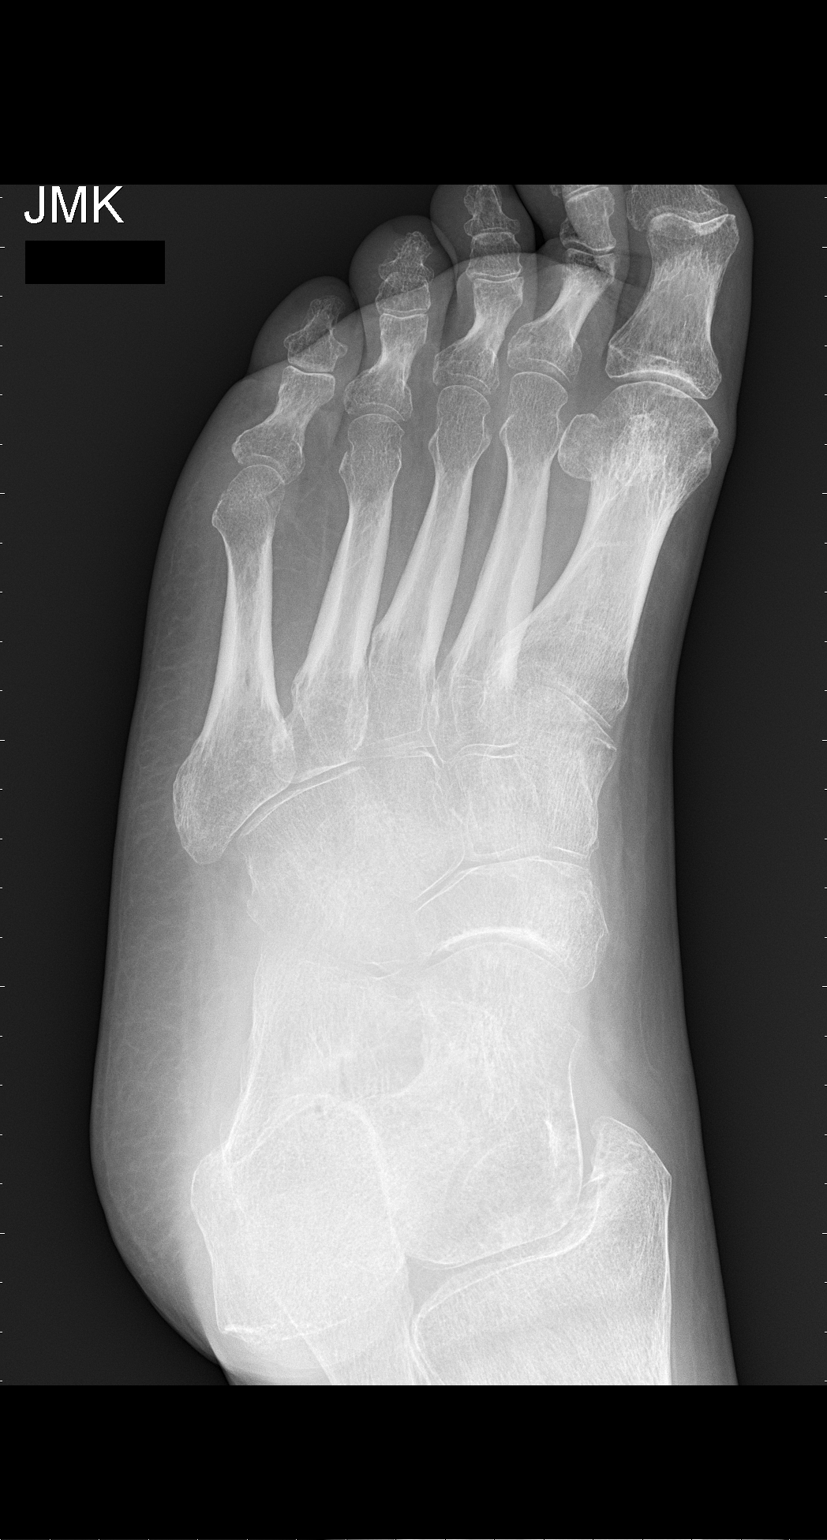

[foot lat]
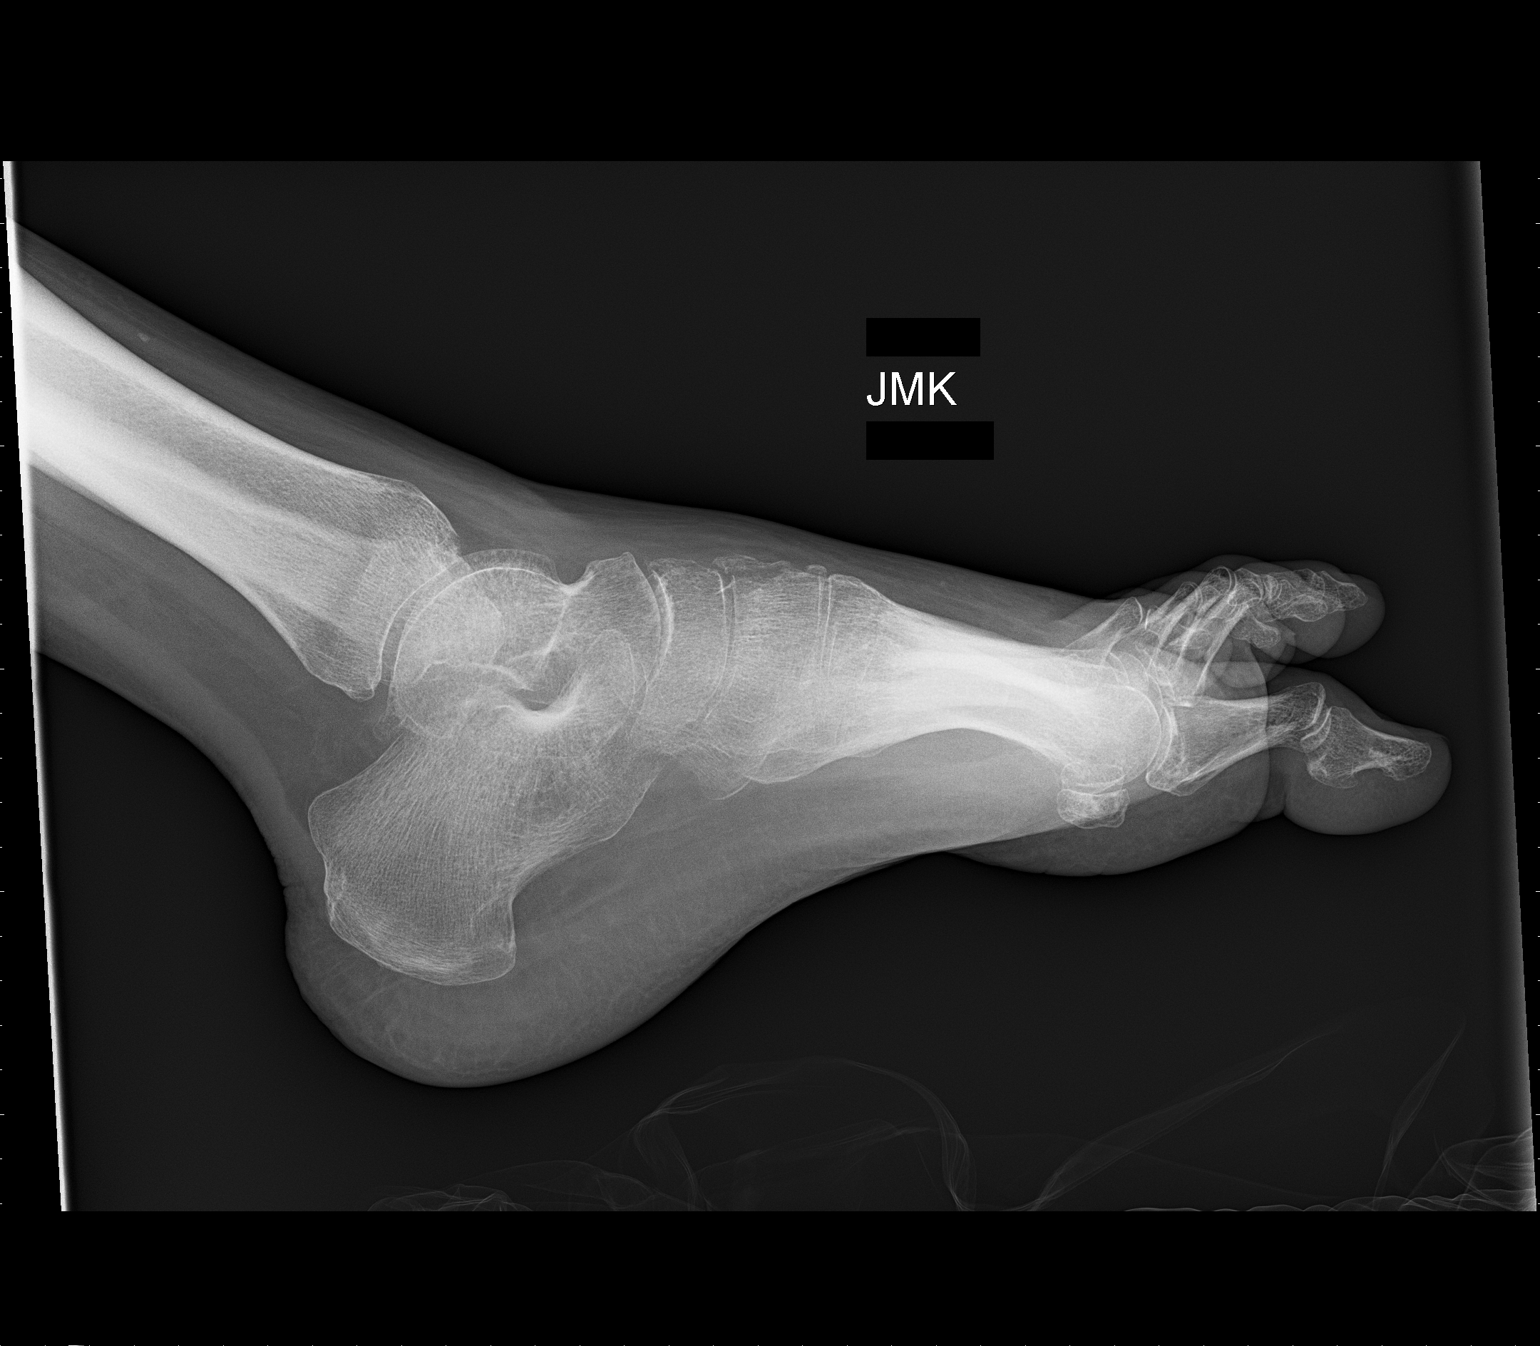

[3 of 3 positions shown; findings below may reference images not displayed]

FINDINGS: There is no acute bony or joint abnormality.  There is
some midfoot degenerative change.  Soft tissues unremarkable.
IMPRESSION: No acute finding.

## 2010-02-25 IMAGING — CR DG CHEST 1V PORT
1 series · 1 of 1 positions shown · non-contrast
Comparison: 01/19/2010

CLINICAL DATA: Sepsis/pneumonia

PORTABLE CHEST - 1 VIEW

[AP]
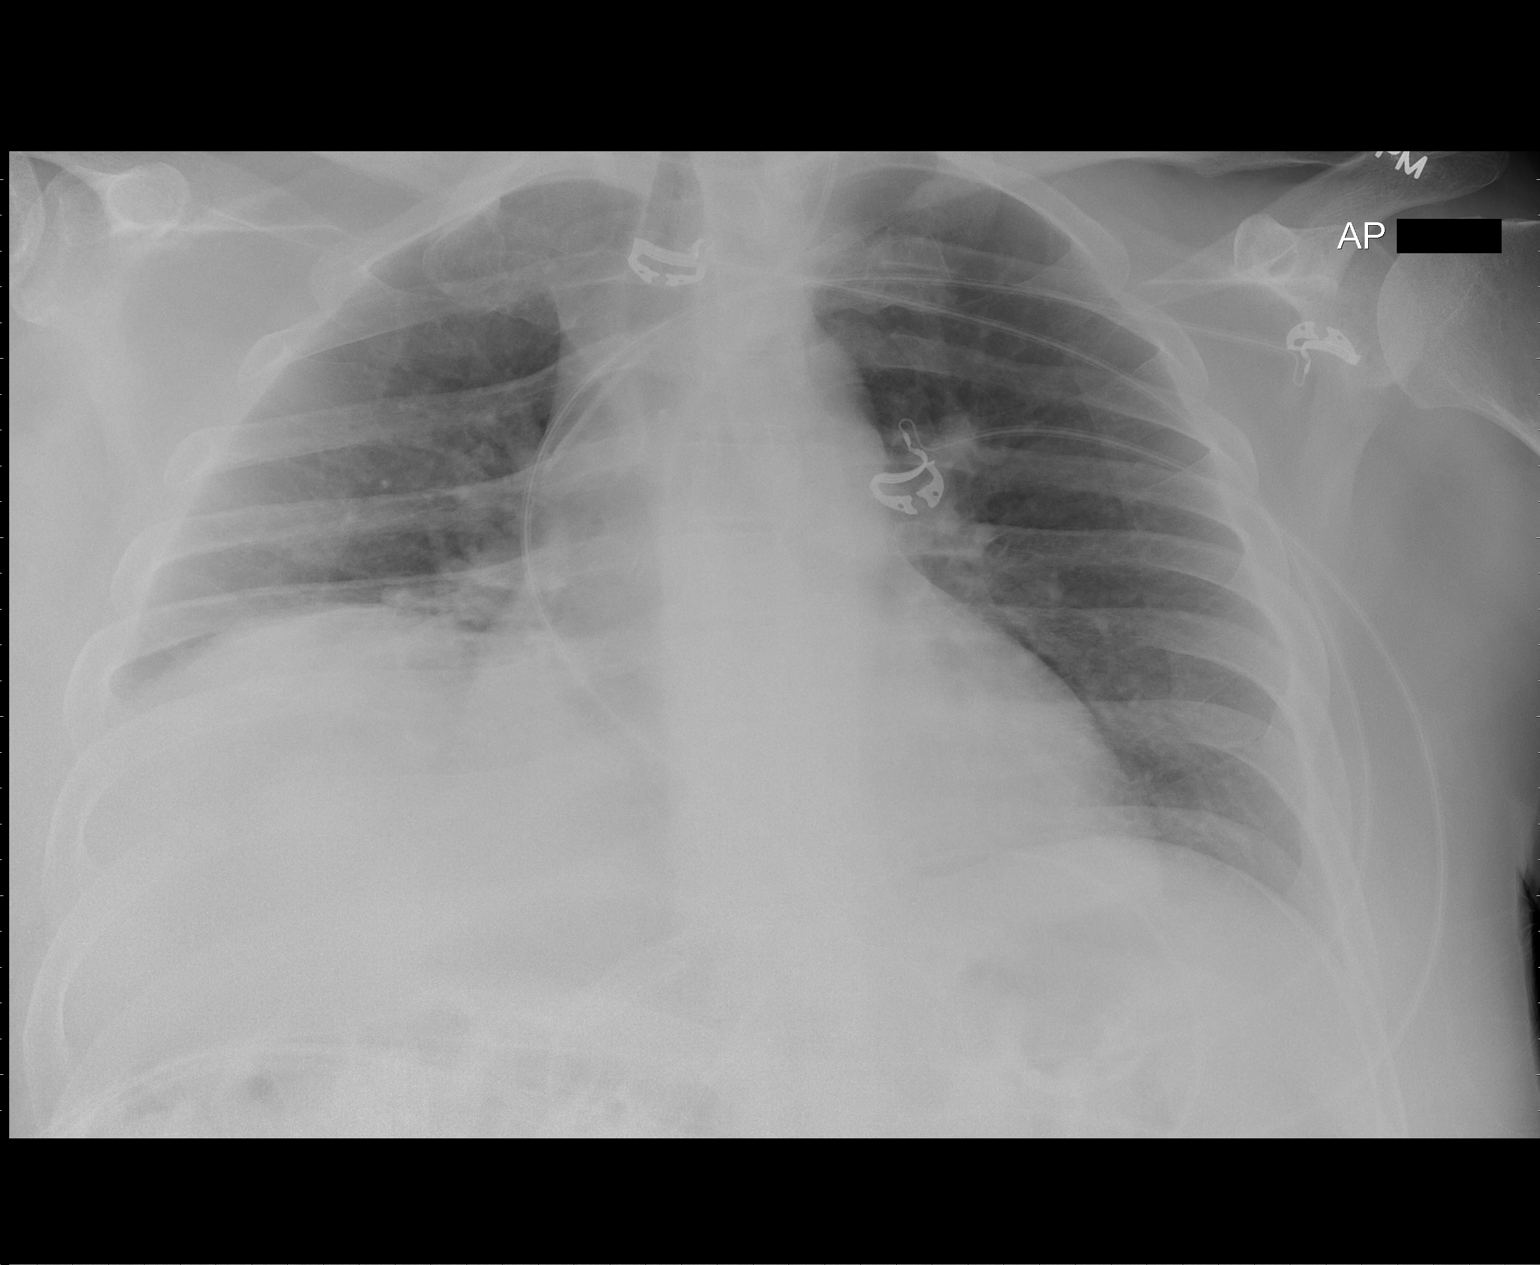

[1 of 1 positions shown; findings below may reference images not displayed]

FINDINGS: Heart size within normal limits.  Right lower lobe
atelectasis or atelectatic pneumonia appears slightly improved.
There is still volume loss at the right base.  The left lung clear
in one-view.  Central line unchanged.
IMPRESSION: Right lower lobe atelectasis or atelectatic pneumonia with slight
interval improvement.  No new findings.

## 2010-02-26 IMAGING — CR DG CHEST 1V PORT
1 series · 1 of 1 positions shown · non-contrast
Comparison: 01/23/2010.

CLINICAL DATA: Pneumonia.

PORTABLE CHEST - 1 VIEW

[AP]
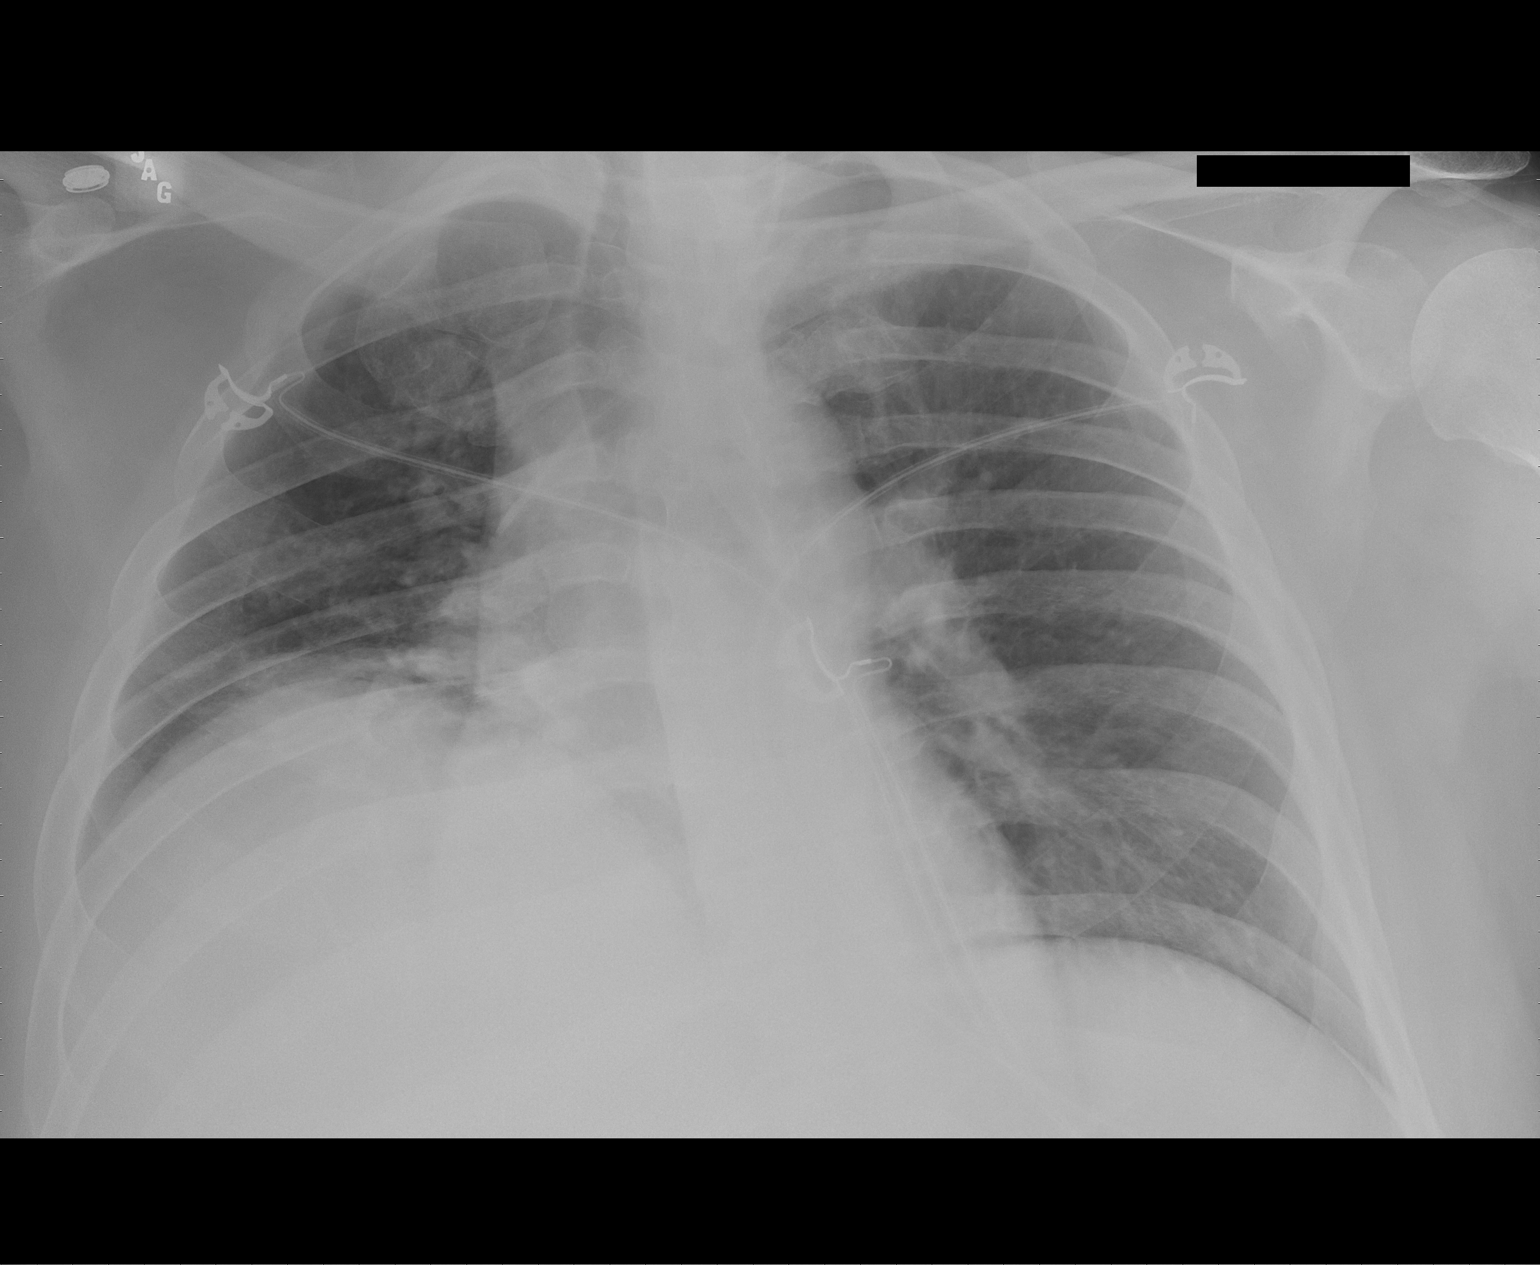

[1 of 1 positions shown; findings below may reference images not displayed]

FINDINGS: Central venous catheter tip is in the SVC and unchanged.
There is elevation of the right hemidiaphragm with right lower lobe
atelectasis which is unchanged.  Left lung remains clear and there
is no heart failure.
IMPRESSION: Right lower lobe volume loss is unchanged.  No new findings.

## 2010-02-28 IMAGING — CT CT ABD-PELV W/O CM
2 of 4 series · 17 of 46 positions shown, 19 images · non-contrast
Comparison: Ultrasound 01/19/2010

CLINICAL DATA: Abdominal pain

CT ABDOMEN AND PELVIS WITHOUT CONTRAST
TECHNIQUE: Multidetector CT imaging of the abdomen and pelvis was
performed following the standard protocol without intravenous
contrast.

[Series 2: stone_wo 5.0 b40f st · axial · 0.97mm/px · z∈[-506,-6]mm · 14 of 110 slices shown, 16 images]
[im 5/110  soft-tissue]
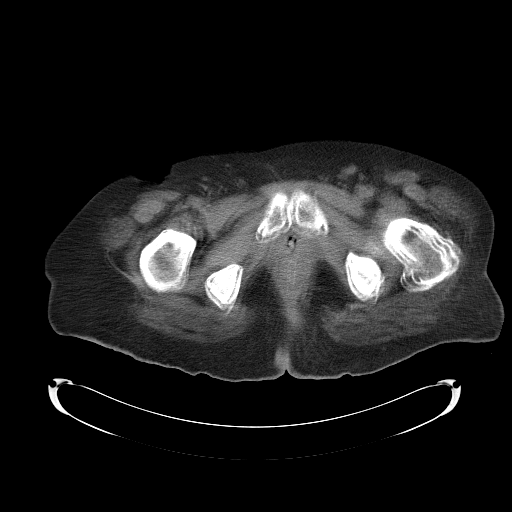
[im 5/110  bone]
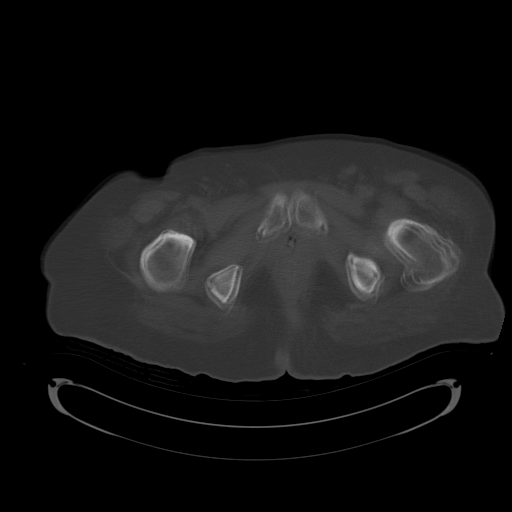
[im 15/110  soft-tissue]
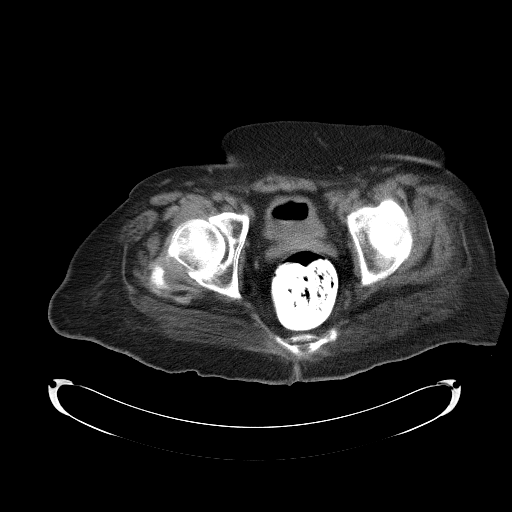
[im 20/110  soft-tissue]
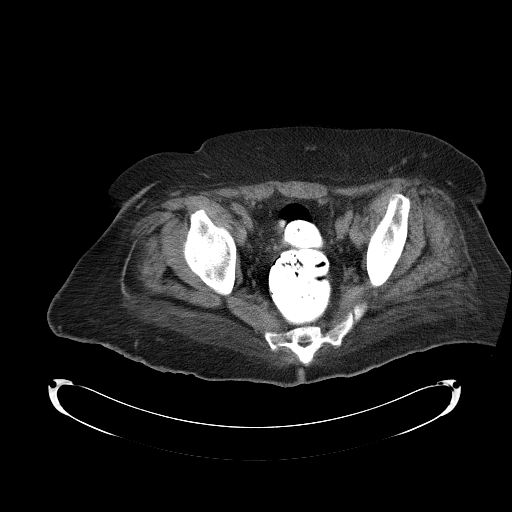
[im 30/110  soft-tissue]
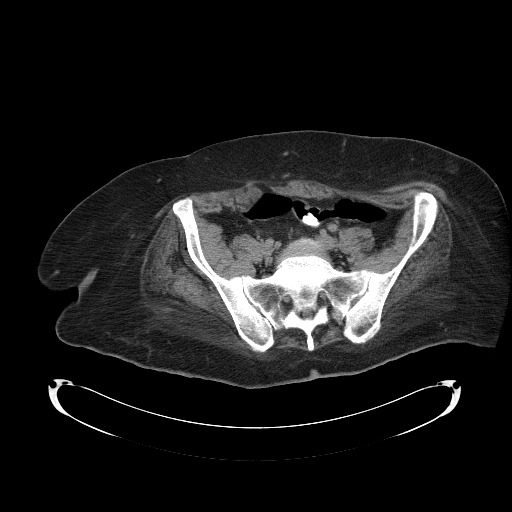
[im 35/110  soft-tissue]
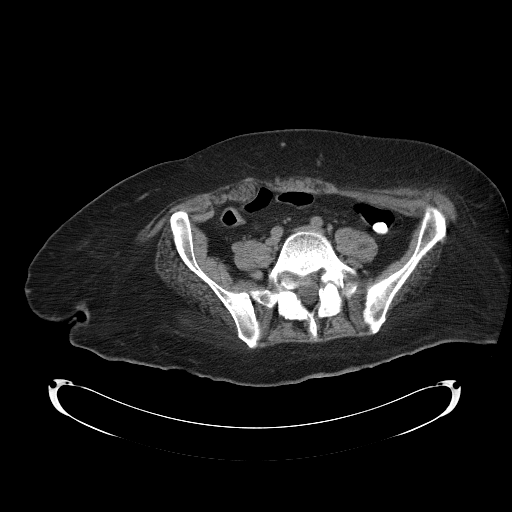
[im 45/110  soft-tissue]
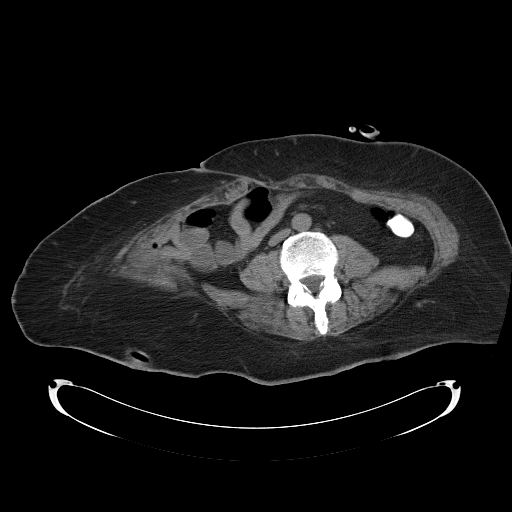
[im 50/110  soft-tissue]
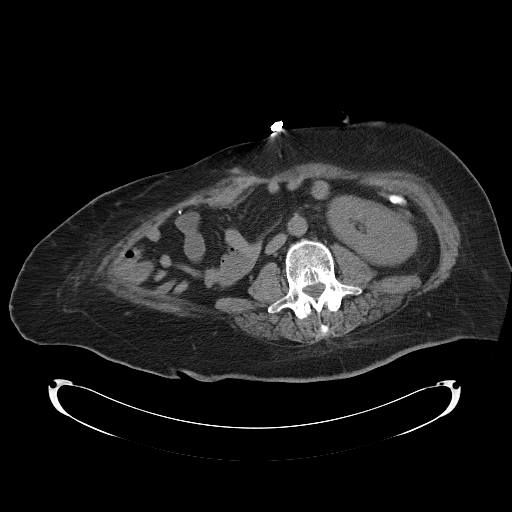
[im 60/110  soft-tissue]
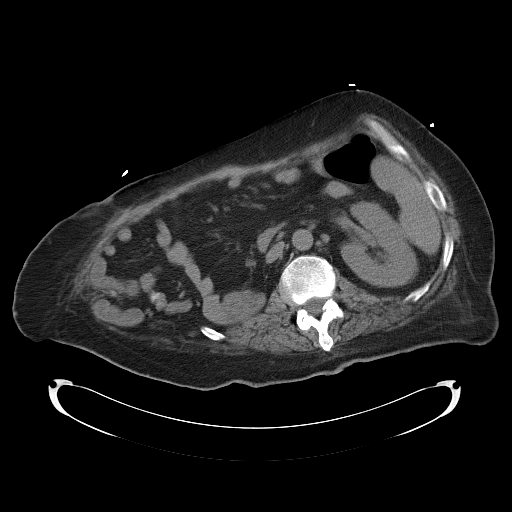
[im 65/110  soft-tissue]
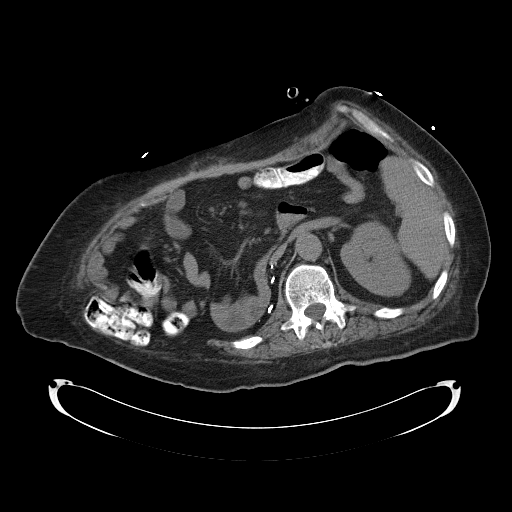
[im 65/110  bone]
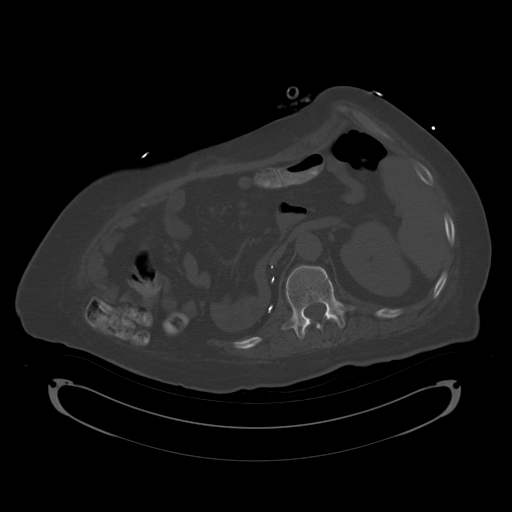
[im 75/110  soft-tissue]
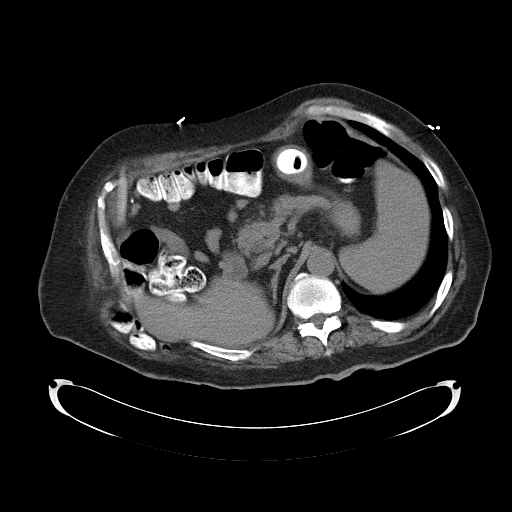
[im 80/110  soft-tissue]
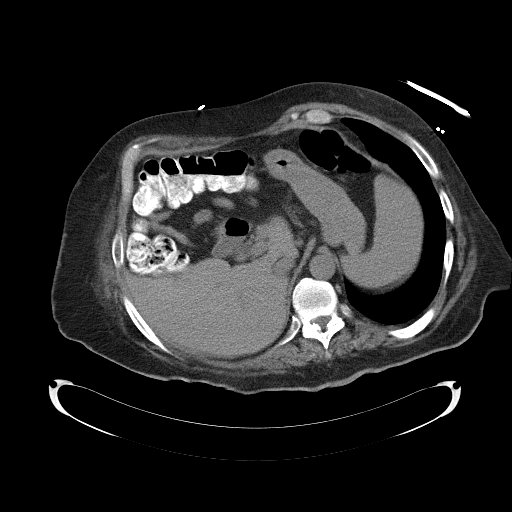
[im 90/110  soft-tissue]
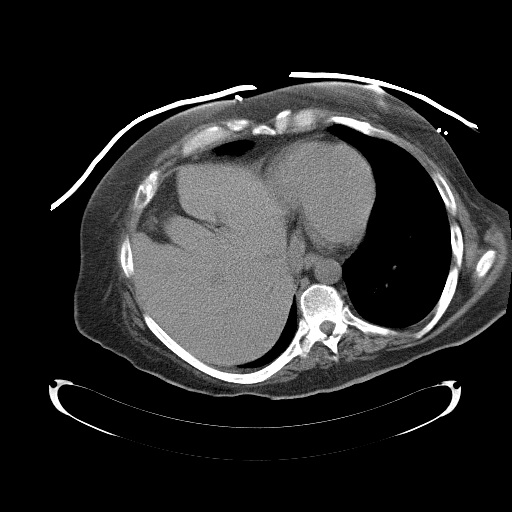
[im 95/110  soft-tissue]
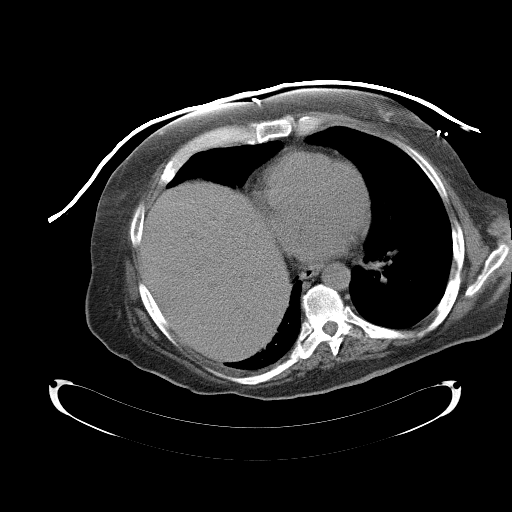
[im 105/110  soft-tissue]
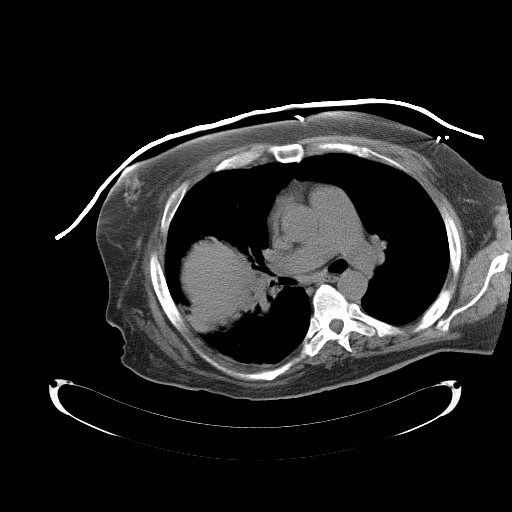

[Series 602: coronla · coronal · 1.11mm/px · 3 of 88 slices shown]
[im 30/88  soft-tissue]
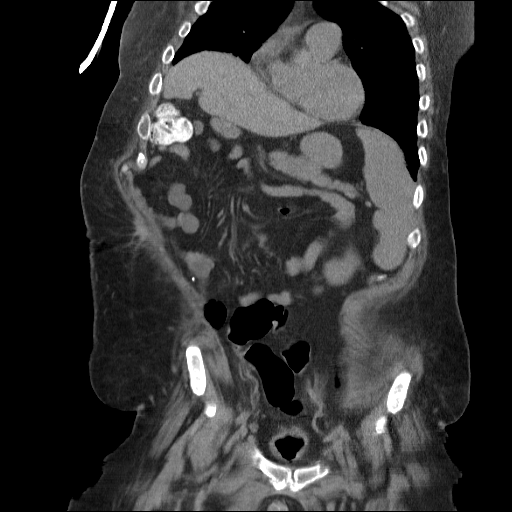
[im 39/88  soft-tissue]
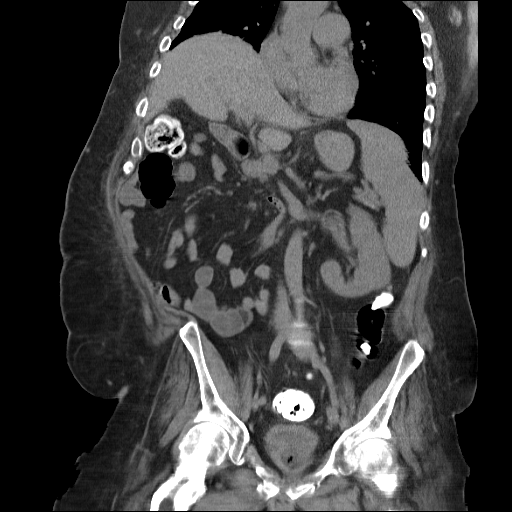
[im 49/88  soft-tissue]
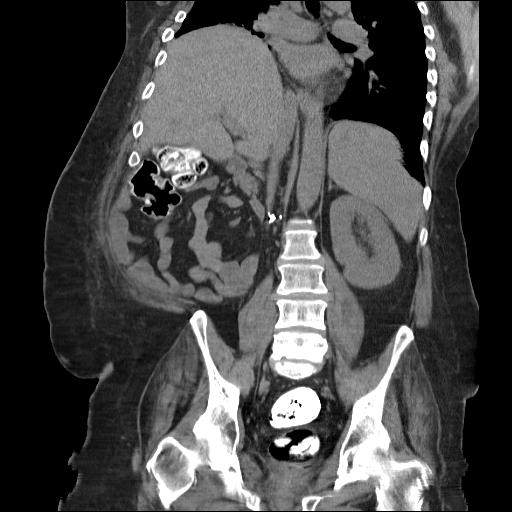

[17 of 46 positions shown; findings below may reference images not displayed]

FINDINGS: There is fine branching nodular pattern within the right
lower lobe suggestive of infectious or inflammatory process.

Non-IV contrast images demonstrate no focal hepatic lesion.
Cholecystectomy clips in the gallbladder fossa. The pancreas,
spleen, adrenal glands, and left kidney appear normal.  Prior right
nephrectomy.  No evidence of nodularity within the nephrectomy bed.

The stomach contains a percutaneous gastrostomy tube positioned in
the gastric antrum.  The small bowel, cecum appear normal.
Appendix is not identified.  There is retained barium within the
colon.

Abdominal aorta is normal caliber.  No evidence retroperitoneal
lymphadenopathy.

Foley catheter within the bladder.  There is a moderate volume of
gas in the bladder presumably related catheterization.  Prostate is
normal.  No pelvic adenopathy.  No aggressive osseous lesions.
Bilateral pars defects at L5 with grade 1 anterolisthesis.
IMPRESSION: 1.  Right lower lobe bronchopneumonia.
2.  No acute abdominal pelvic findings.
3.  Prior right nephrectomy.
4.  Peg tube within the stomach.
5.  Retained barium within the colon.
6.  Bilateral pars defects with grade 1 anterolisthesis.
7.  Moderate volume of gas within the bladder presumably related to
the catheterization.  Cannot exclude infection.

## 2010-03-01 IMAGING — CR DG CHEST 1V PORT
1 series · 1 of 1 positions shown · non-contrast
Comparison: 01/24/2010

CLINICAL DATA: PICC line placement

PORTABLE CHEST - 1 VIEW

[view not recorded]
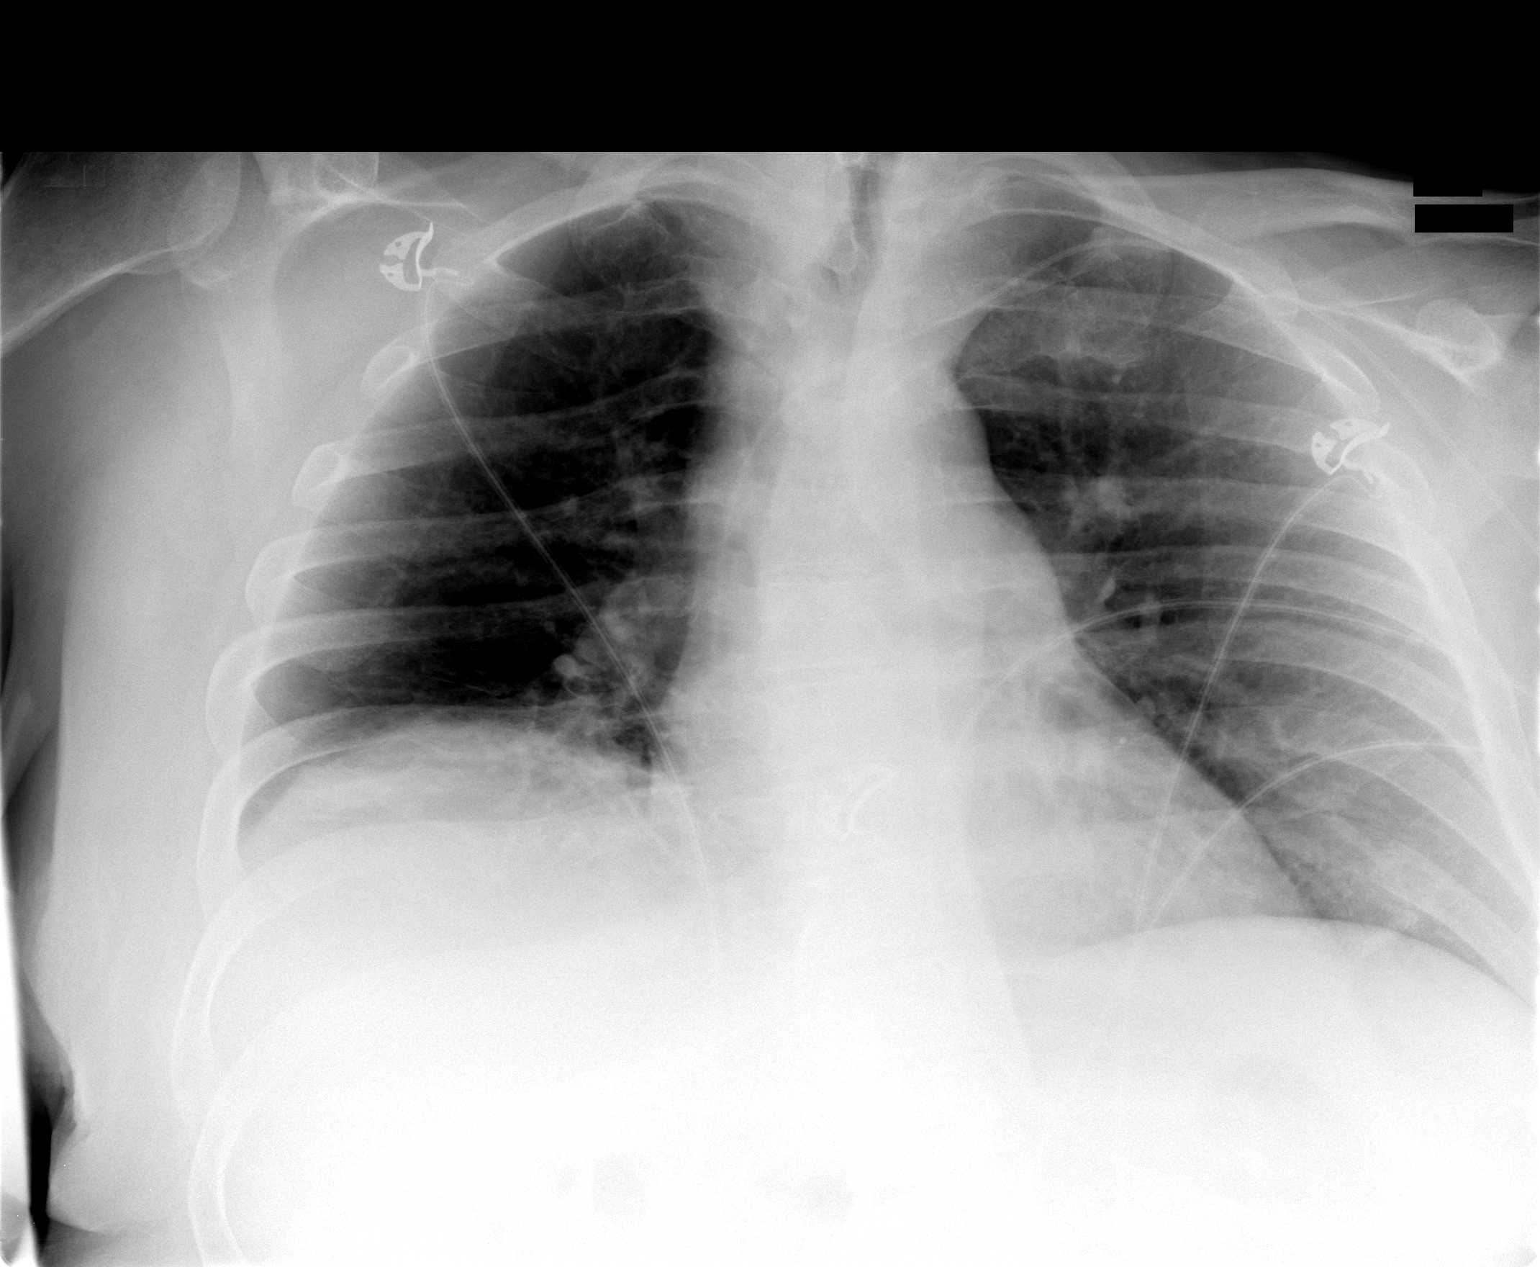

[1 of 1 positions shown; findings below may reference images not displayed]

FINDINGS: PICC line enters via left upper extremity approach.
Catheter tip is in the lower SVC near the SVC - right atrial
junction.  Minimal atelectasis at the right base.  No airspace
densities.  Normal cardiomediastinal silhouette.
IMPRESSION: PICC line is near the SVC - right atrial junction.

## 2010-03-04 ENCOUNTER — Inpatient Hospital Stay (HOSPITAL_COMMUNITY): Admission: EM | Admit: 2010-03-04 | Discharge: 2010-03-07 | Payer: Self-pay | Admitting: Emergency Medicine

## 2010-03-17 IMAGING — CR DG ABDOMEN ACUTE W/ 1V CHEST
6 series · 6 of 6 positions shown · non-contrast
Comparison: None.

CLINICAL DATA: Gastrostomy tube placement

ACUTE ABDOMEN SERIES (ABDOMEN 2 VIEW & CHEST 1 VIEW)

[w abdomen decub * (1 of 2)]
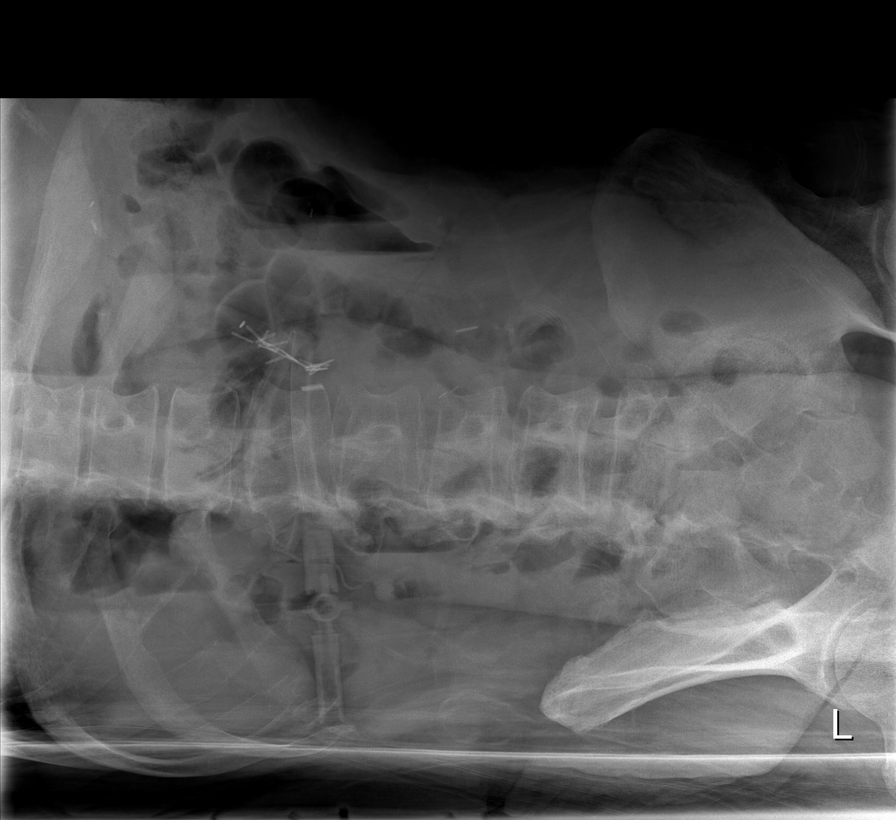

[w abdomen decub * (2 of 2)]
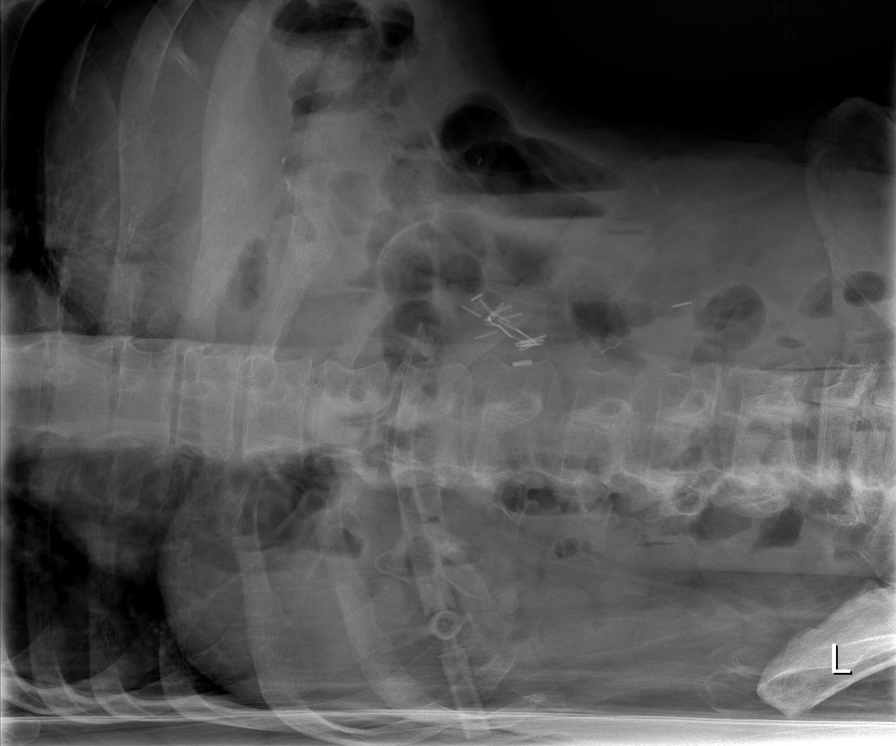

[t abdomen supine (1 of 3)]
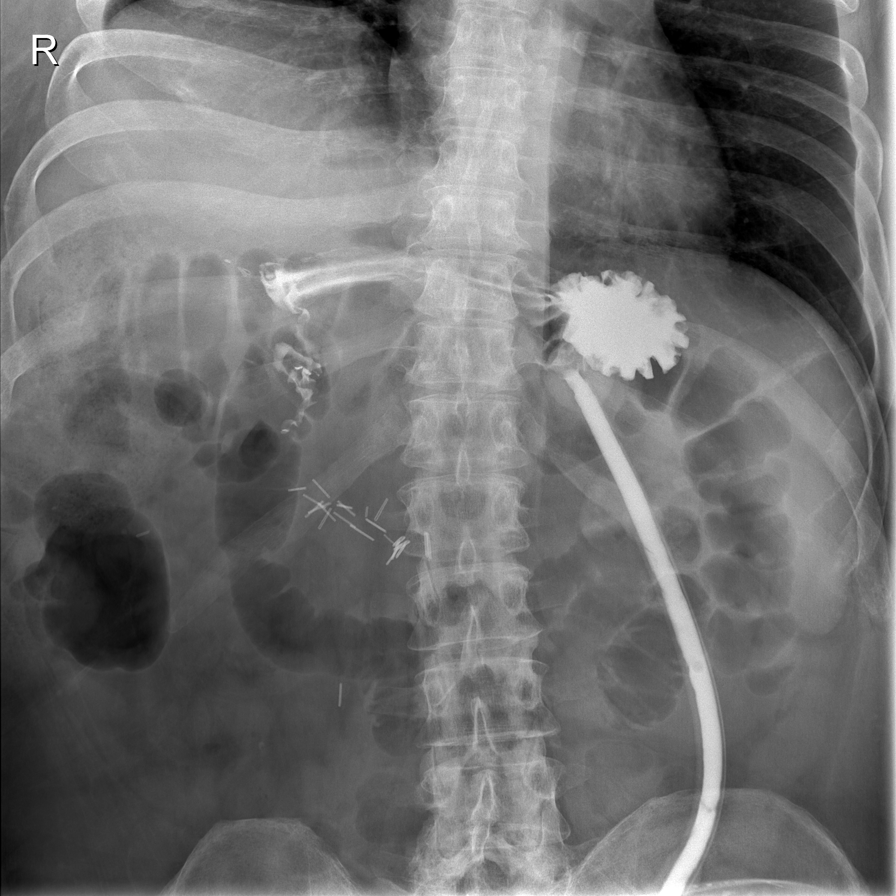

[t abdomen supine (2 of 3)]
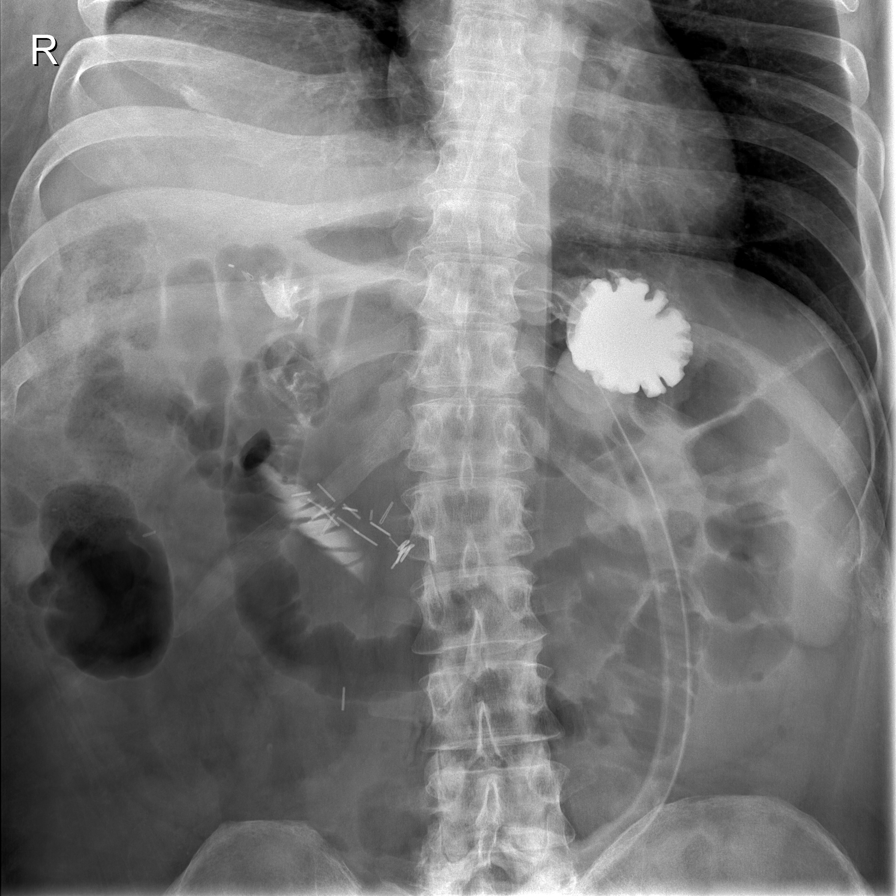

[t abdomen supine (3 of 3)]
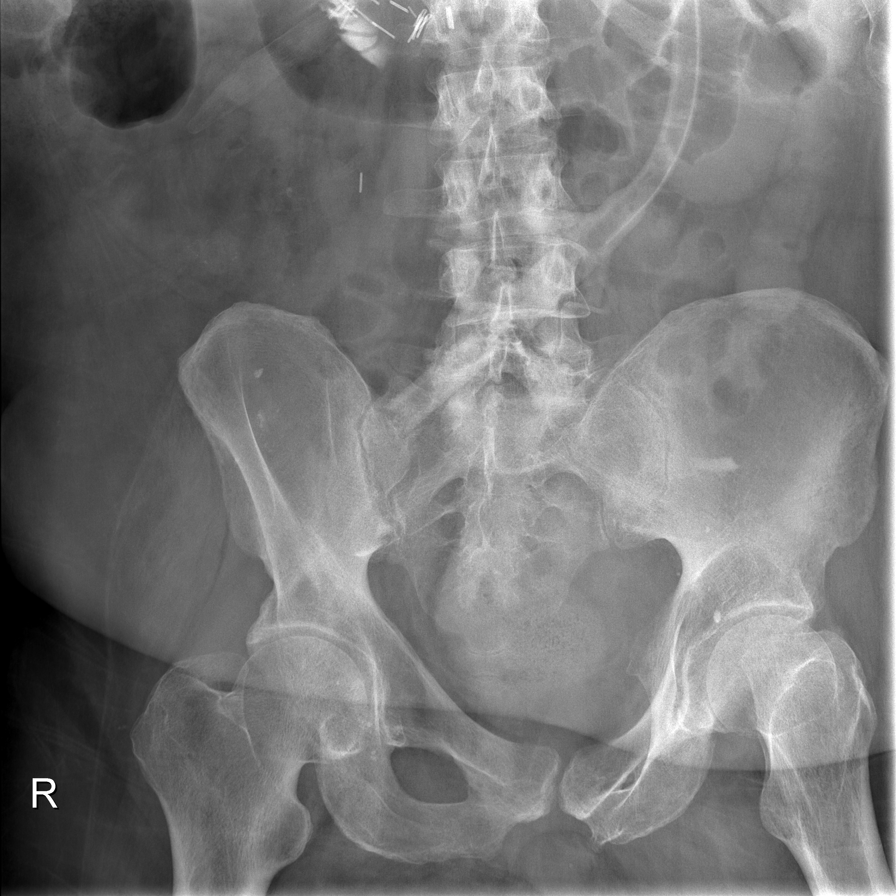

[t abdomen supine *]
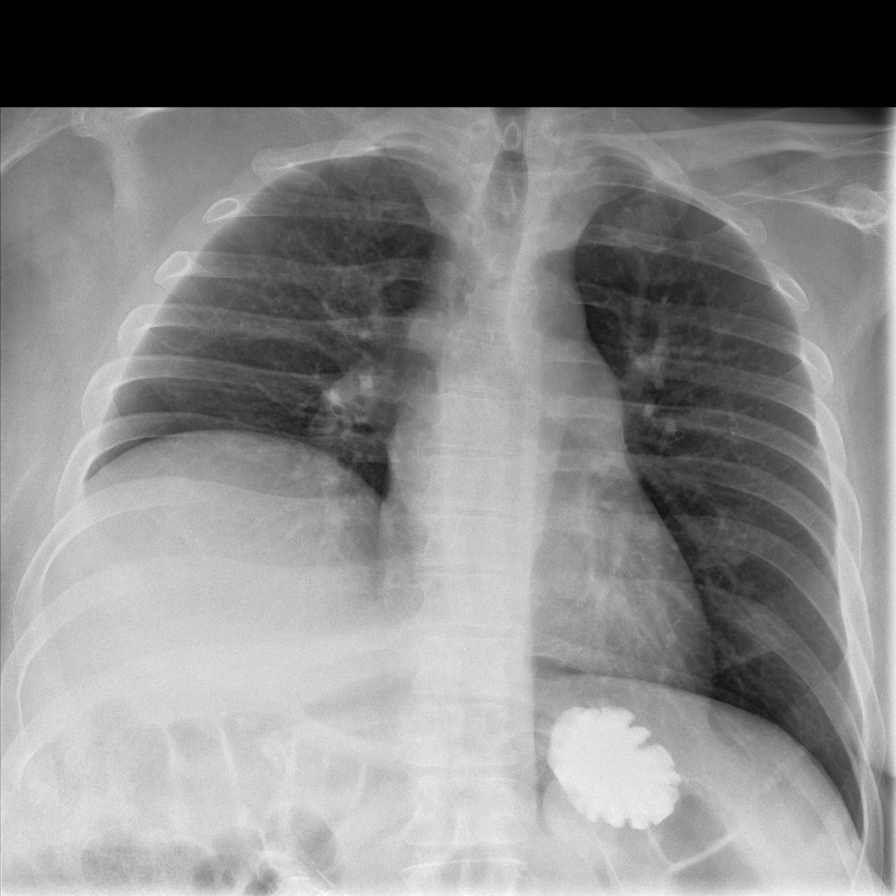

[6 of 6 positions shown; findings below may reference images not displayed]

FINDINGS: Contrast is injected into the existing gastrostomy tube.
It is appropriately positioned in the body of the stomach.
Contrast fills the stomach and proximal duodenum.  There is no
extravasation to suggest leak.

There is no free intraperitoneal gas on the left decubitus film.
No disproportionate dilatation of bowel is present.  Nonspecific
air fluid levels.

The right hemidiaphragm is elevated but the lungs are clear and the
heart is normal in size.
IMPRESSION: Gastrostomy tube is appropriately positioned and is without
complication.

Nonobstructive bowel gas pattern and no free intraperitoneal gas.

No active cardiopulmonary disease.  Right hemidiaphragm remains
elevated.

## 2010-03-18 ENCOUNTER — Emergency Department (HOSPITAL_COMMUNITY): Admission: EM | Admit: 2010-03-18 | Discharge: 2010-03-19 | Payer: Self-pay | Admitting: Emergency Medicine

## 2010-03-20 ENCOUNTER — Ambulatory Visit (HOSPITAL_COMMUNITY): Admission: RE | Admit: 2010-03-20 | Discharge: 2010-03-20 | Payer: Self-pay | Admitting: Diagnostic Radiology

## 2010-03-28 IMAGING — CR DG CHEST 2V
2 series · 2 of 2 positions shown · non-contrast
Comparison: 01/27/2010

CLINICAL DATA: Weakness and altered level of consciousness.

CHEST - 2 VIEW

[w chest lat *]
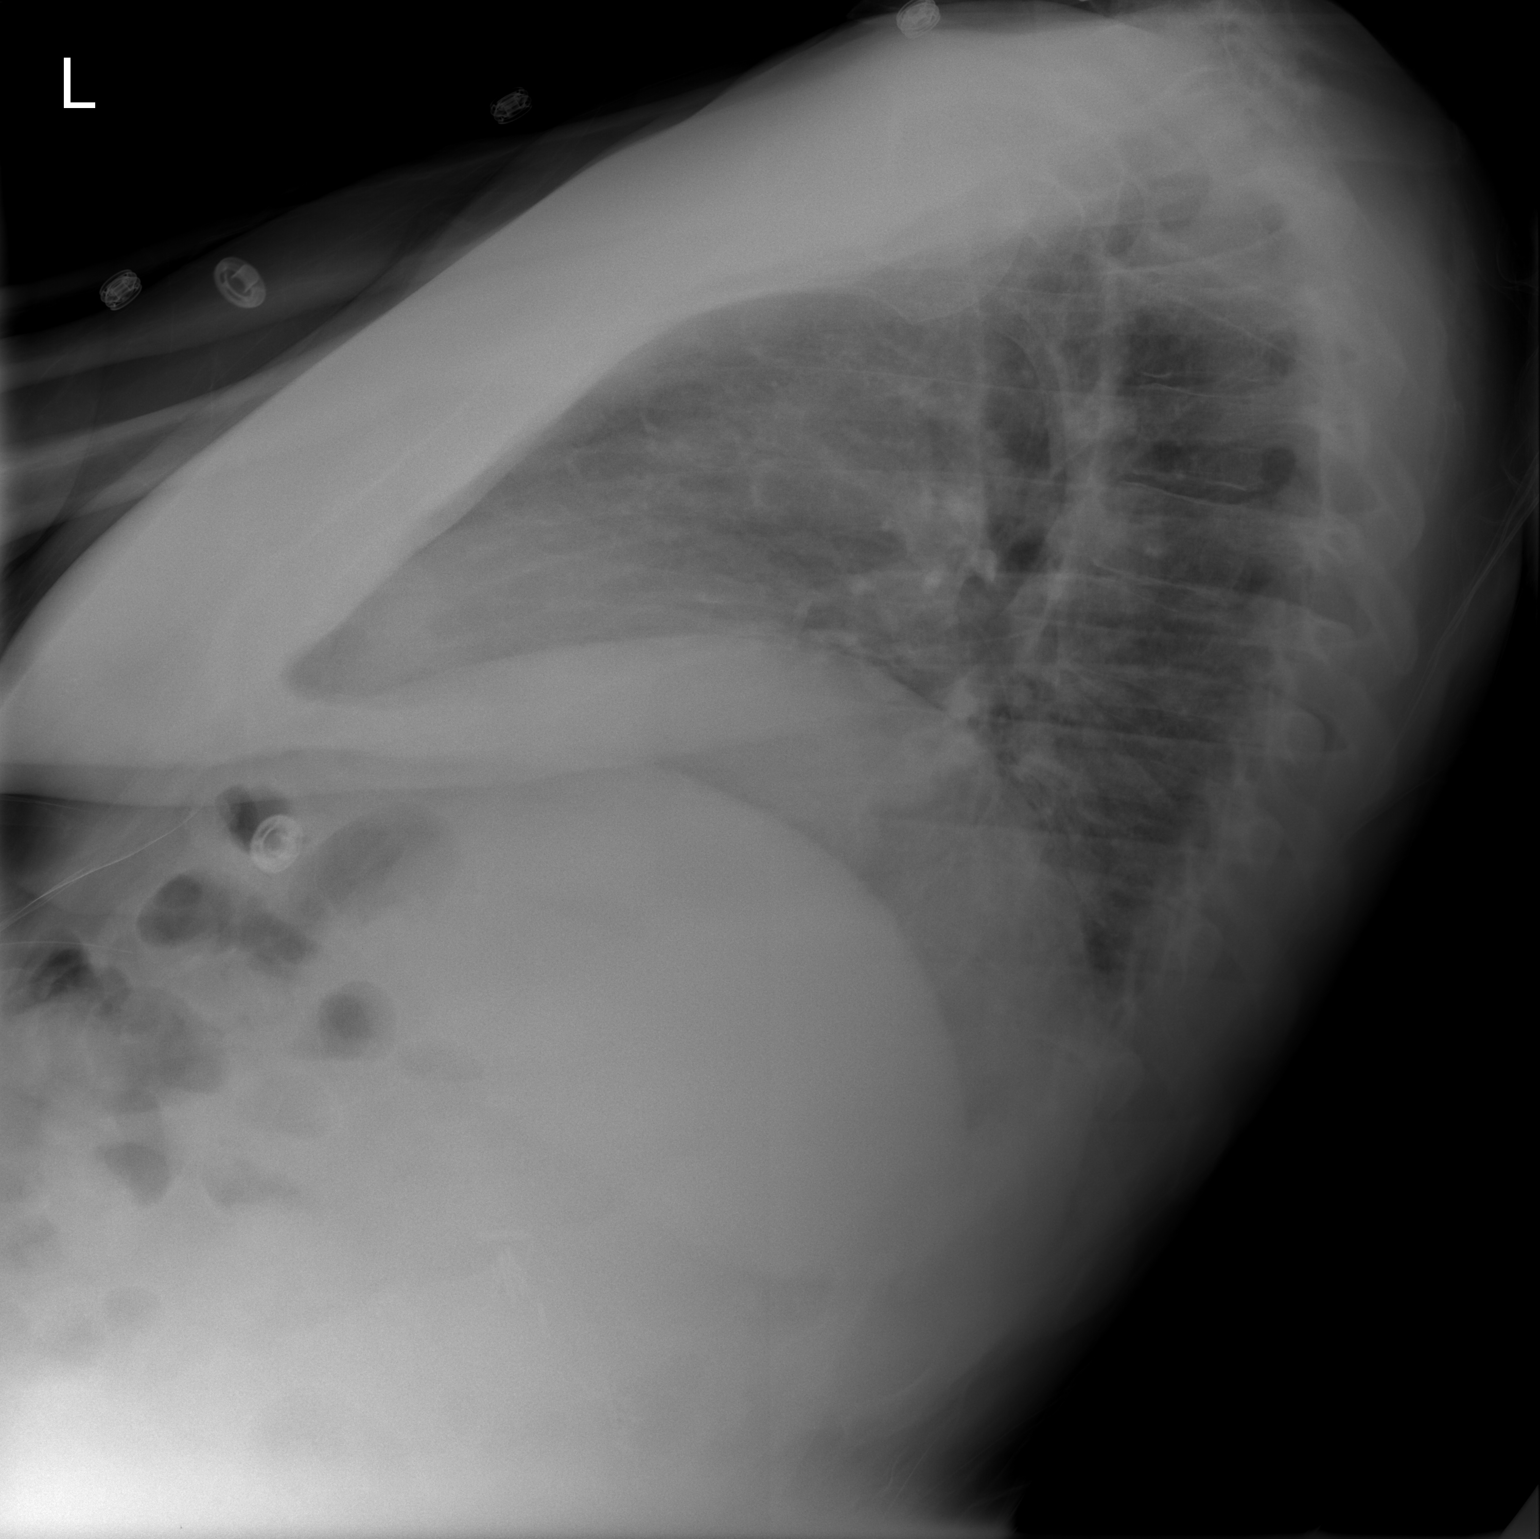

[view not recorded]
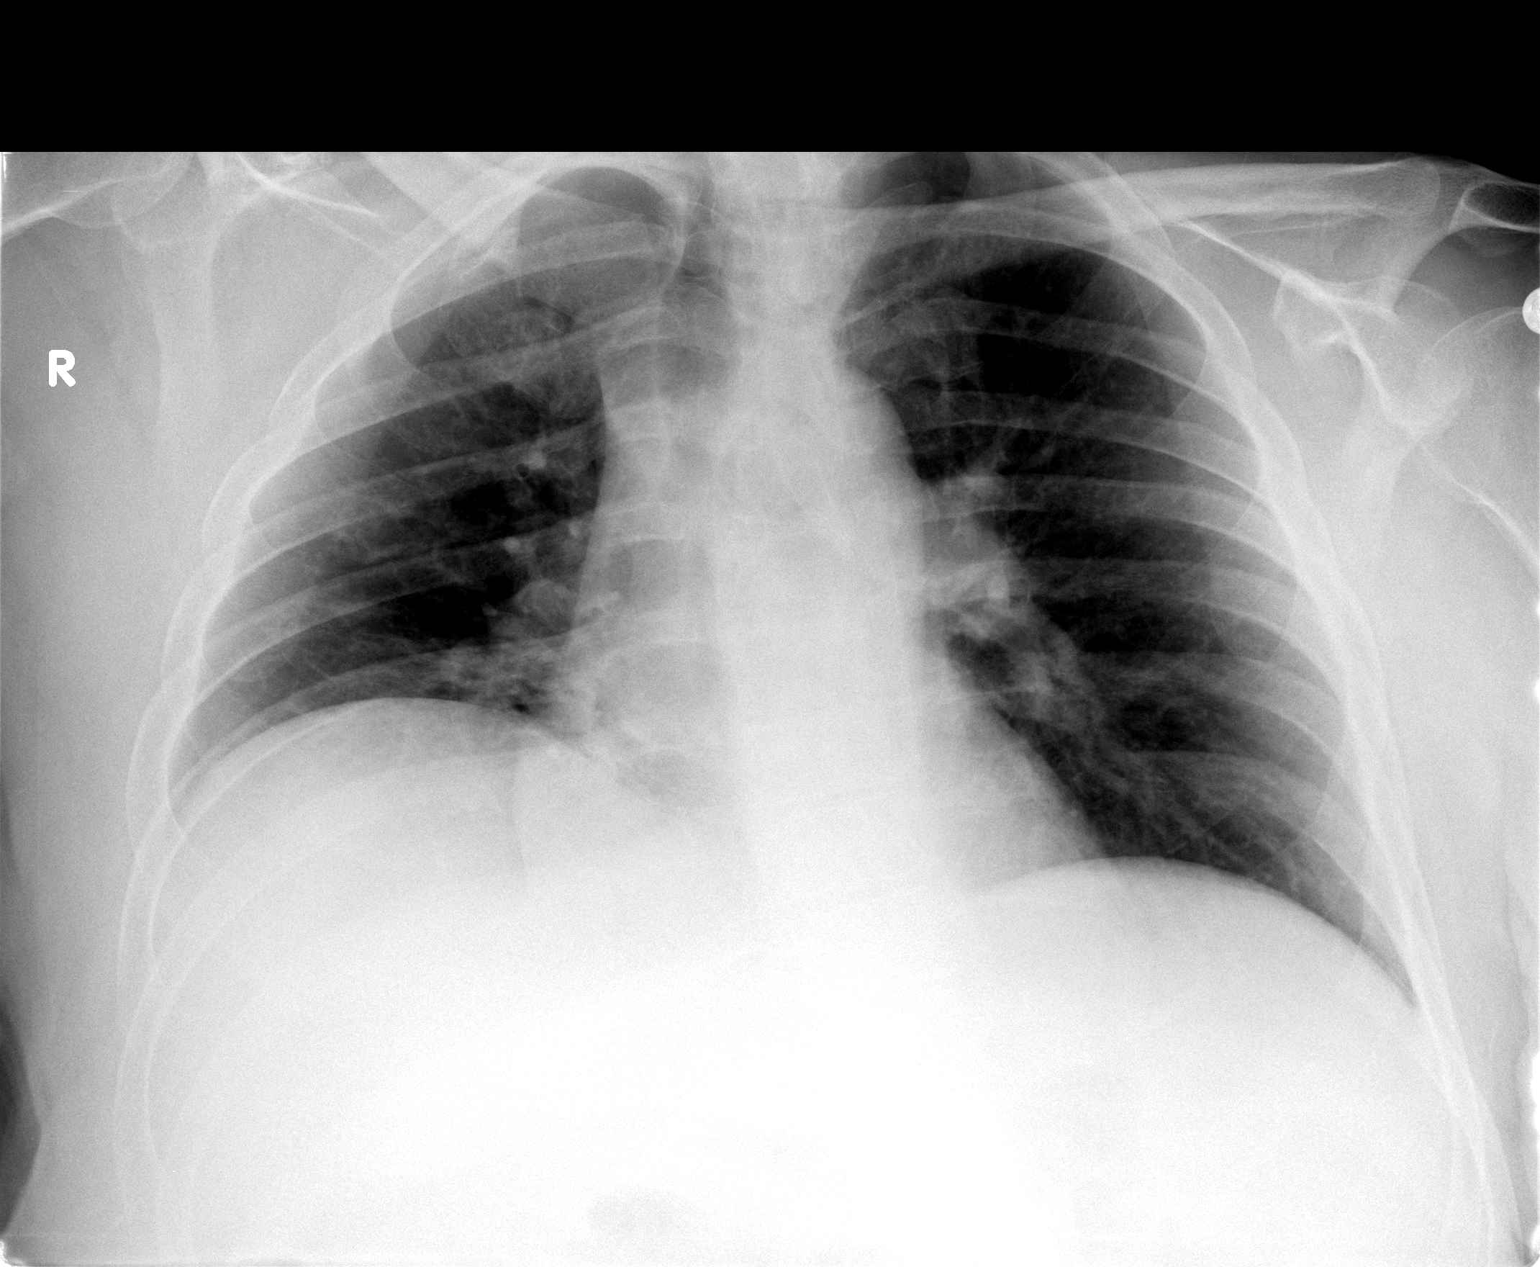

[2 of 2 positions shown; findings below may reference images not displayed]

FINDINGS: The cardiac silhouette, mediastinal and hilar contours
are of stable.  There is elevation of the right hemidiaphragm with
overlying vascular crowding and atelectasis.  No infiltrates, edema
or effusions.  The bony thorax is intact.
IMPRESSION: No acute cardiopulmonary findings.

## 2010-03-28 IMAGING — CT CT HEAD W/O CM
1 series · 16 of 30 positions shown, 20 images · non-contrast
Comparison: 01/19/2010

CLINICAL DATA: Weakness and altered level of consciousness.

CT HEAD WITHOUT CONTRAST
TECHNIQUE: Contiguous axial images were obtained from the base of
the skull through the vertex without contrast.

[Series 2: headseq 4.8 h45s · axial · 0.43mm/px · z∈[-160,-5]mm · 16 of 36 slices shown, 20 images]
[im 2/36  brain]
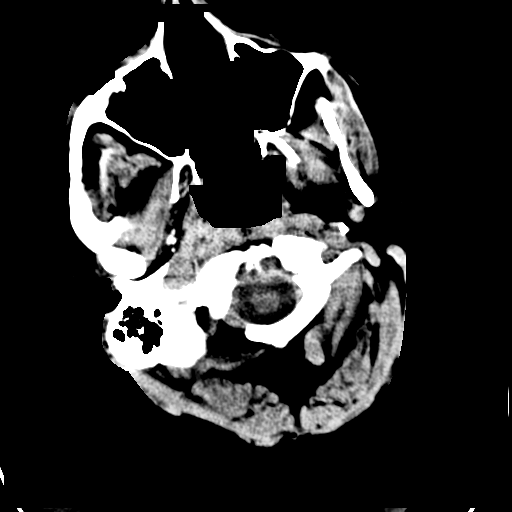
[im 2/36  bone]
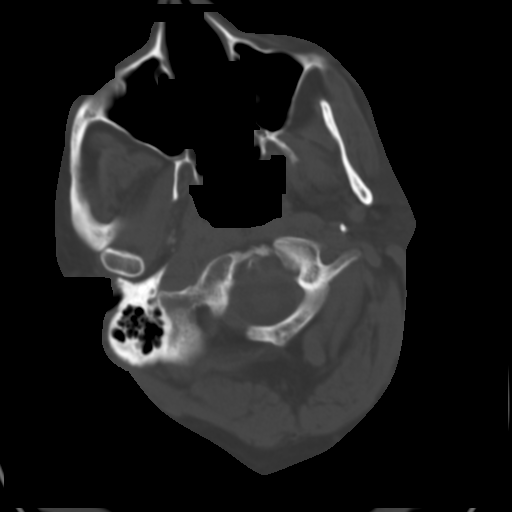
[im 4/36  brain]
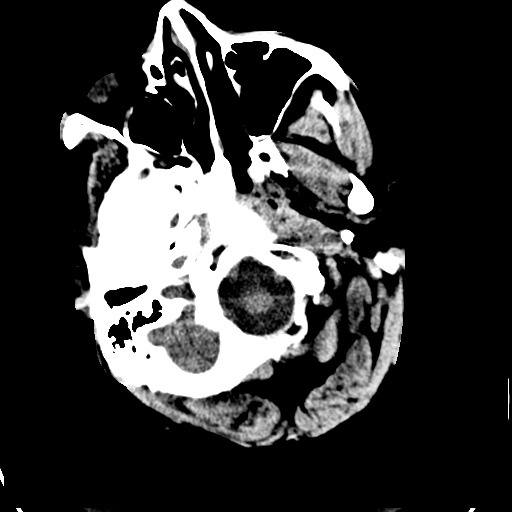
[im 7/36  brain]
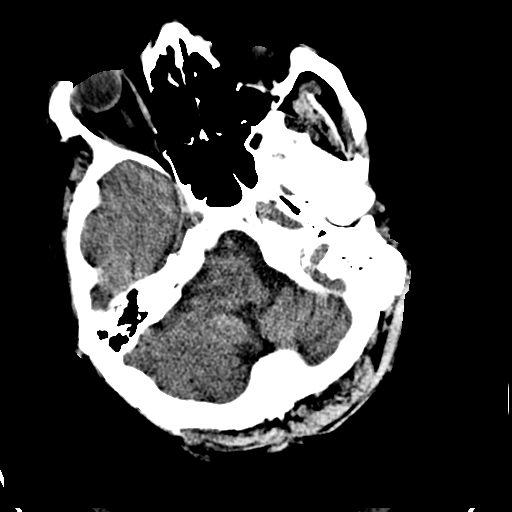
[im 9/36  brain]
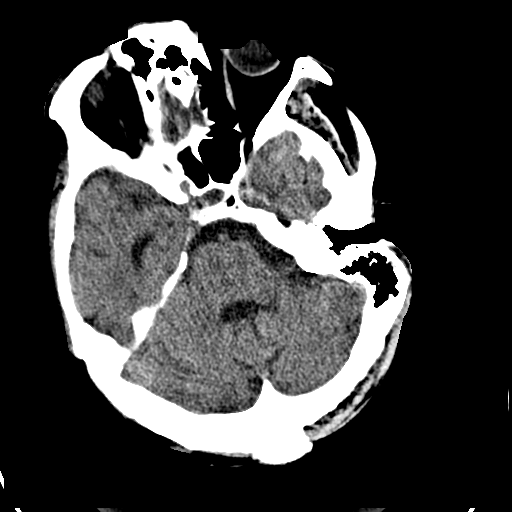
[im 10/36  brain]
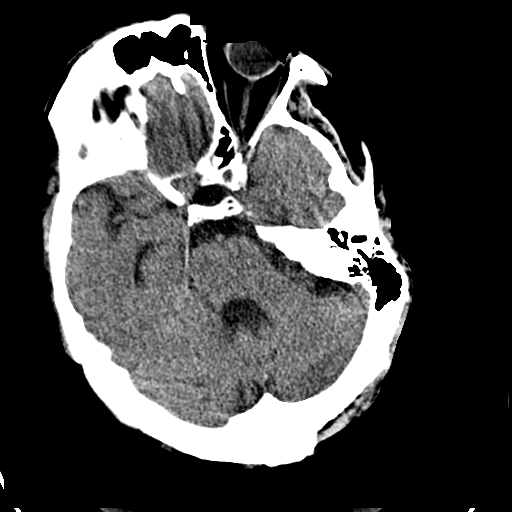
[im 10/36  bone]
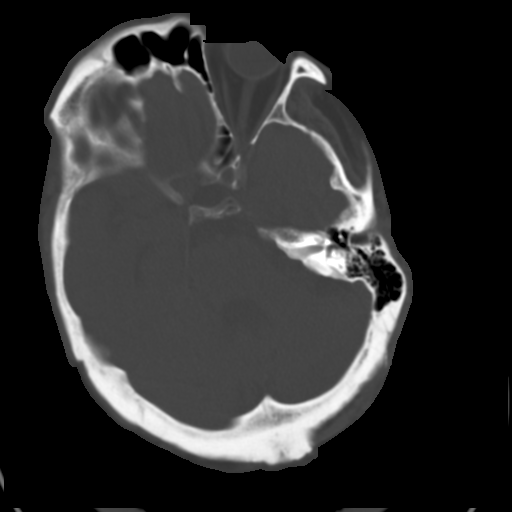
[im 13/36  brain]
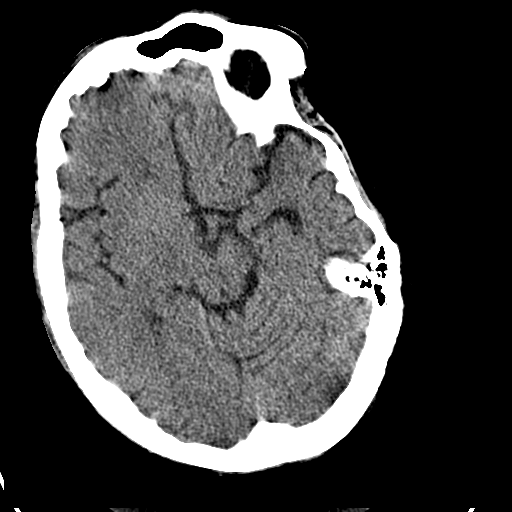
[im 15/36  brain]
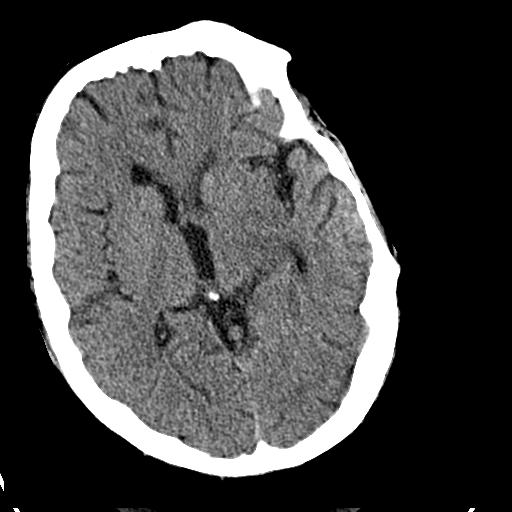
[im 17/36  brain]
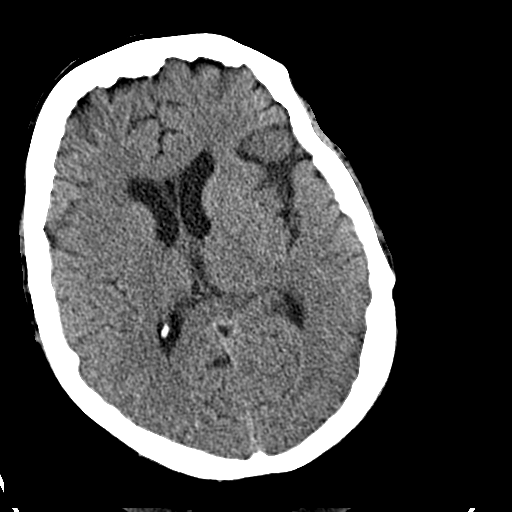
[im 19/36  brain]
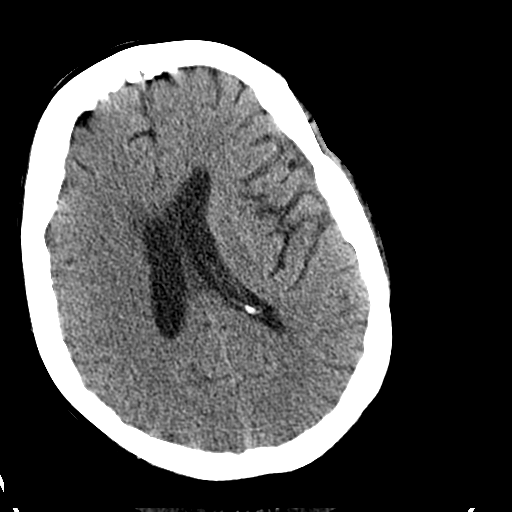
[im 19/36  bone]
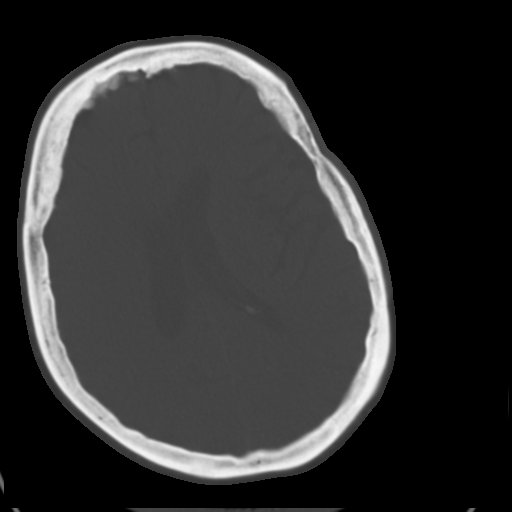
[im 21/36  brain]
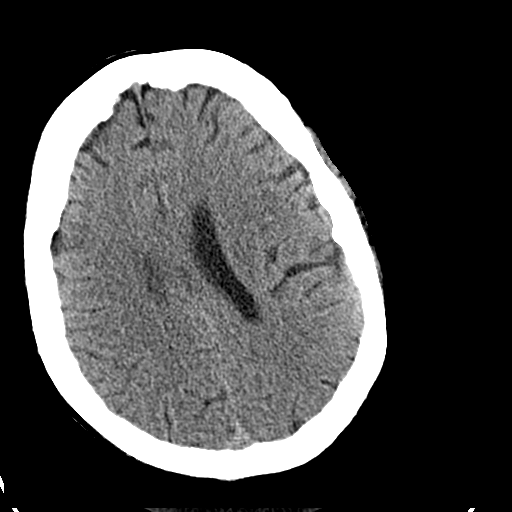
[im 23/36  brain]
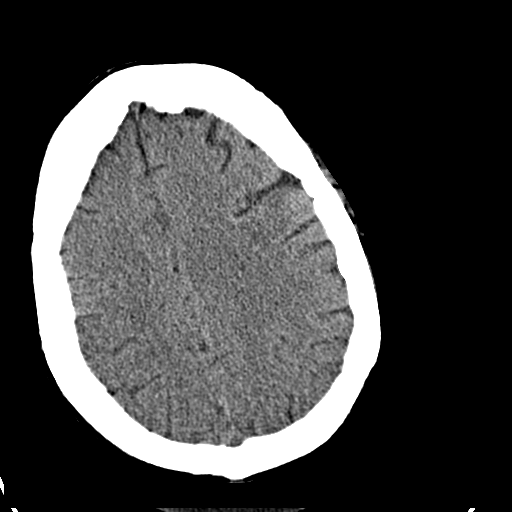
[im 26/36  brain]
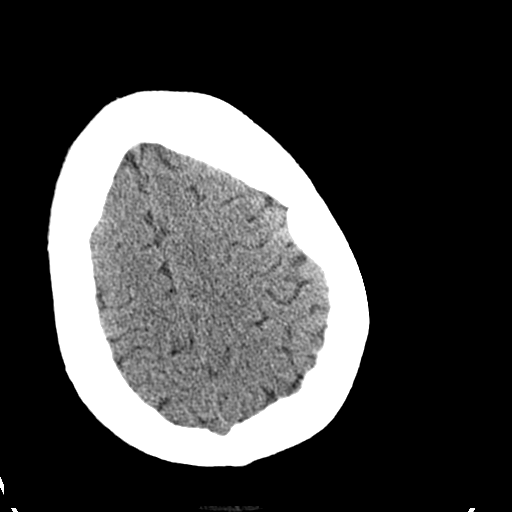
[im 27/36  brain]
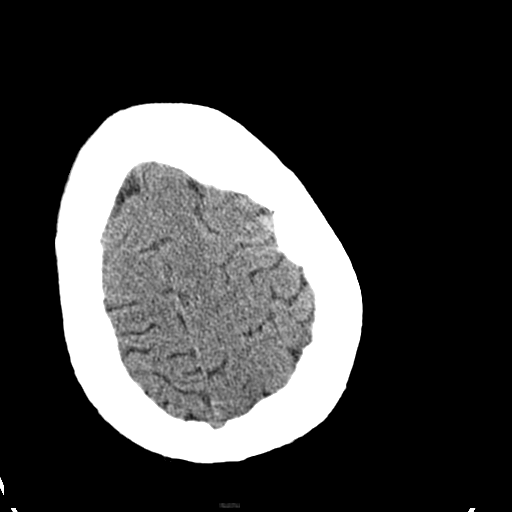
[im 27/36  bone]
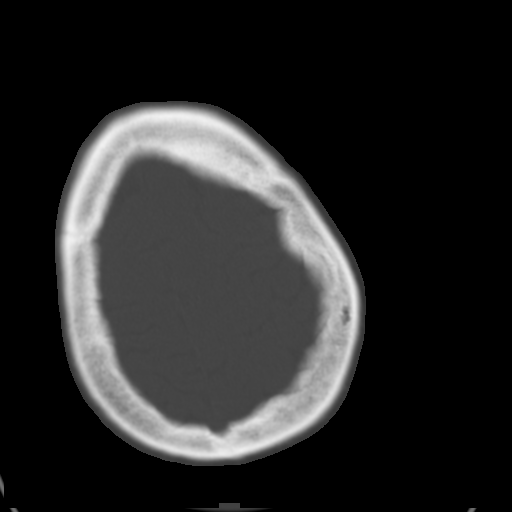
[im 29/36  brain]
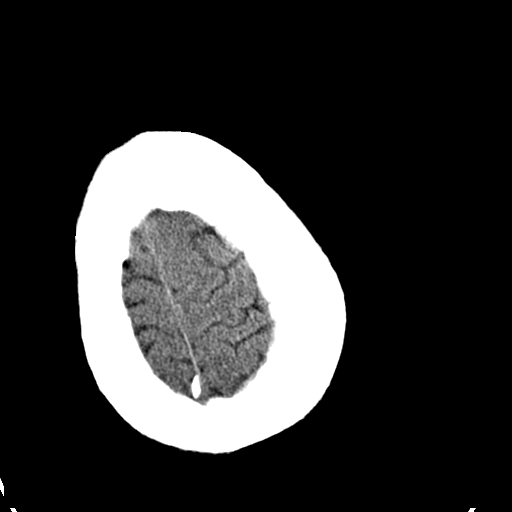
[im 32/36  brain]
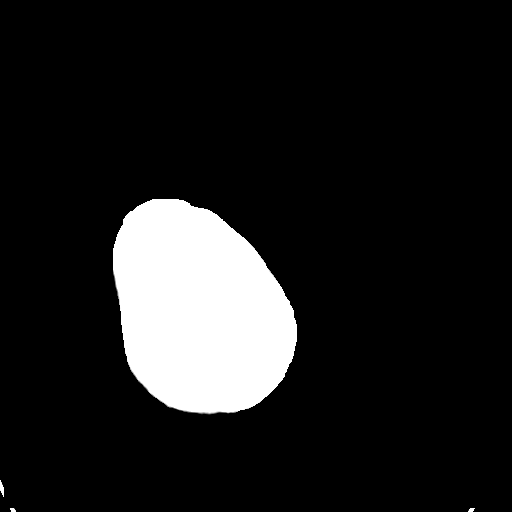
[im 34/36  brain]
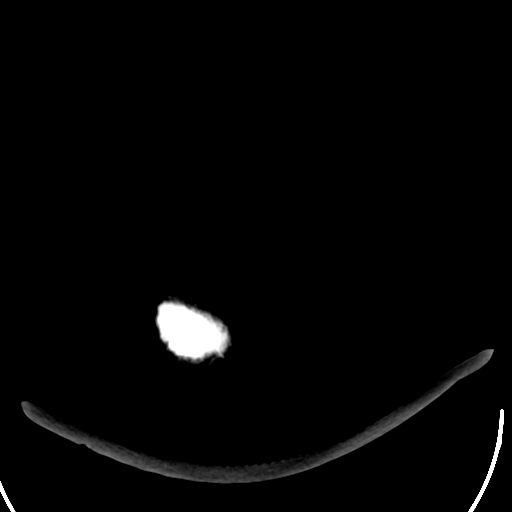

[16 of 30 positions shown; findings below may reference images not displayed]

FINDINGS: The ventricles are normal.  No extra-axial fluid
collections are seen.  The brainstem and cerebellum are
unremarkable.  No acute intracranial findings such as infarction or
hemorrhage.  No mass lesions.

The bony calvarium is intact.  The visualized paranasal sinuses and
mastoid air cells are clear.
IMPRESSION: No acute intracranial findings or mass lesions.  No change since
prior recent study.

## 2010-04-02 ENCOUNTER — Emergency Department (HOSPITAL_COMMUNITY): Admission: EM | Admit: 2010-04-02 | Discharge: 2010-04-02 | Payer: Self-pay | Admitting: Emergency Medicine

## 2010-04-06 IMAGING — CR DG CHEST 1V PORT
1 series · 1 of 1 positions shown · non-contrast
Comparison: 02/23/2010.

CLINICAL DATA: Altered level of consciousness.

PORTABLE CHEST - 1 VIEW

[AP]
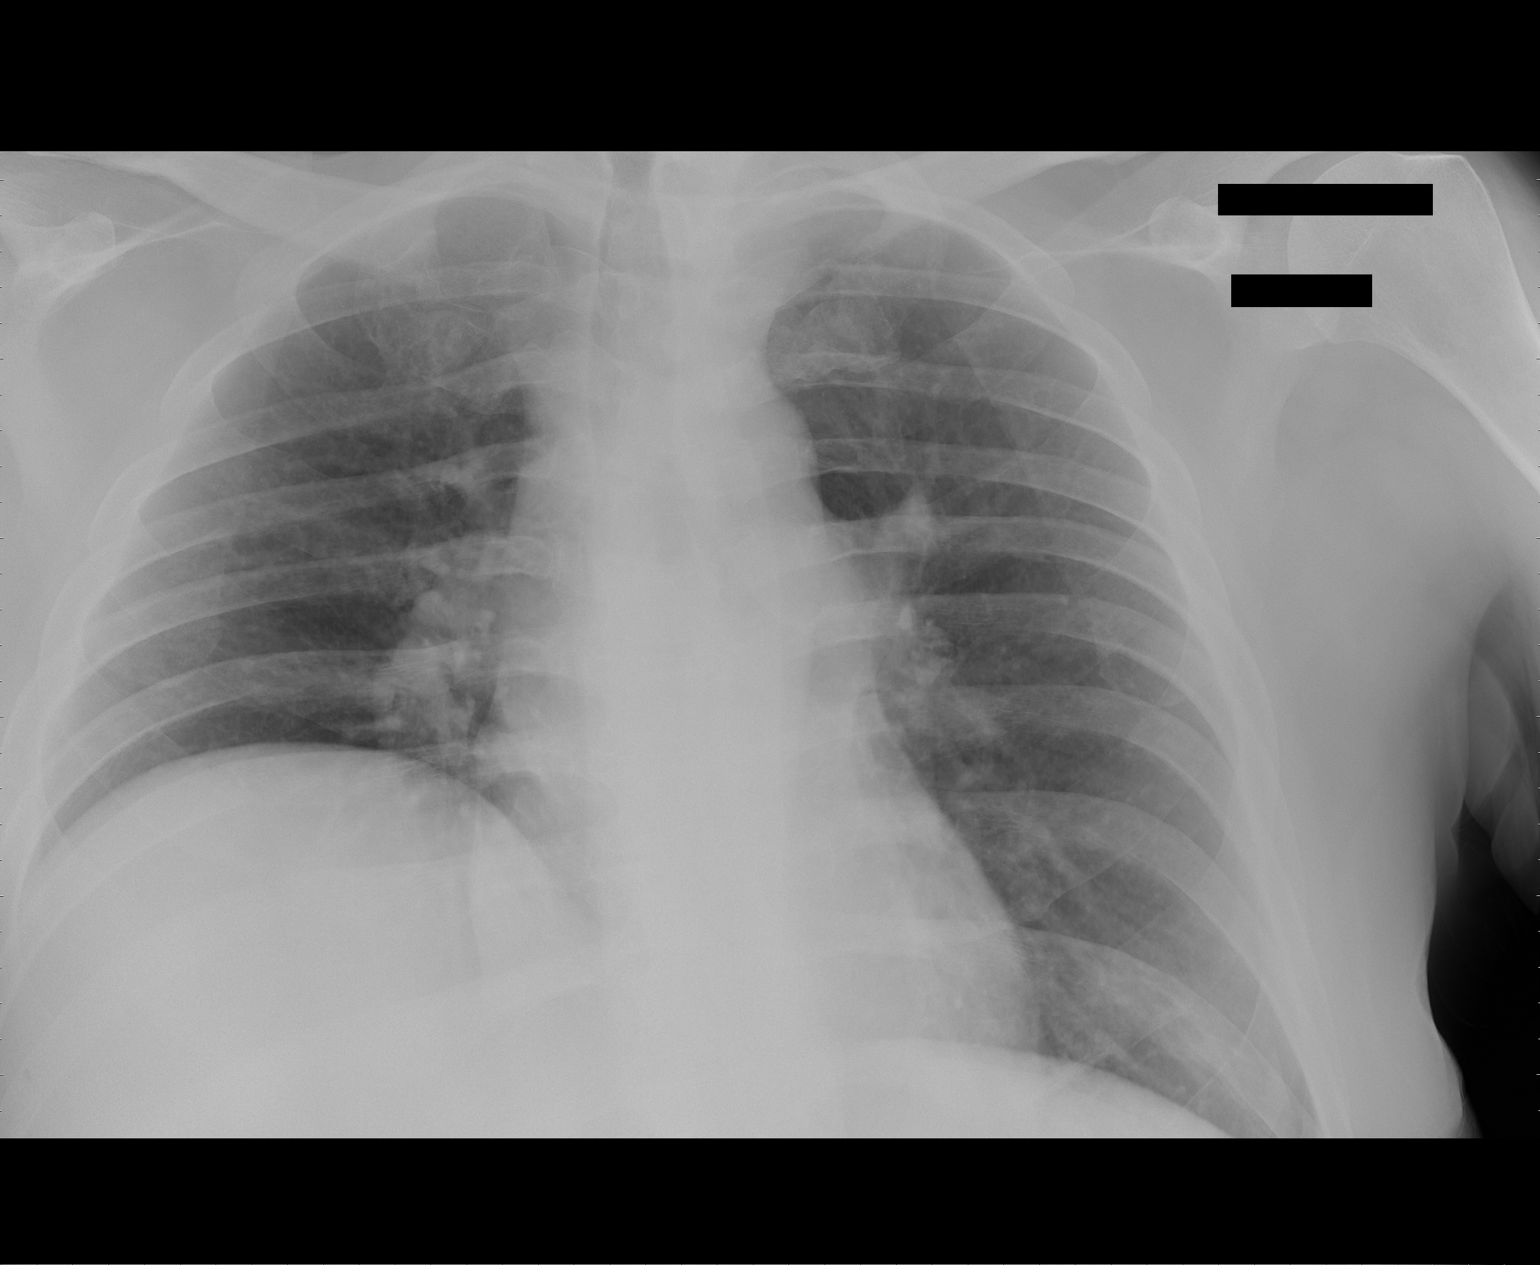

[1 of 1 positions shown; findings below may reference images not displayed]

FINDINGS: Chronic elevation of the right hemidiaphragm with right
basilar atelectasis.  Cardiopericardial silhouette and mediastinal
contours appear within normal limits, unchanged from prior.
IMPRESSION: No active cardiopulmonary disease or interval change from prior.
Chronic elevation of the right hemidiaphragm.

## 2010-04-06 IMAGING — CT CT HEAD W/O CM
2 series · 16 of 30 positions shown, 20 images · non-contrast
Comparison: 02/23/2010.

CLINICAL DATA: Altered level of consciousness.  Altered mental
status.

CT HEAD WITHOUT CONTRAST
TECHNIQUE: Contiguous axial images were obtained from the base of
the skull through the vertex without contrast.

[Series 2: brain · axial · 0.47mm/px · z∈[+144,+267]mm · 13 of 48 slices shown, 17 images]
[im 4/48  brain]
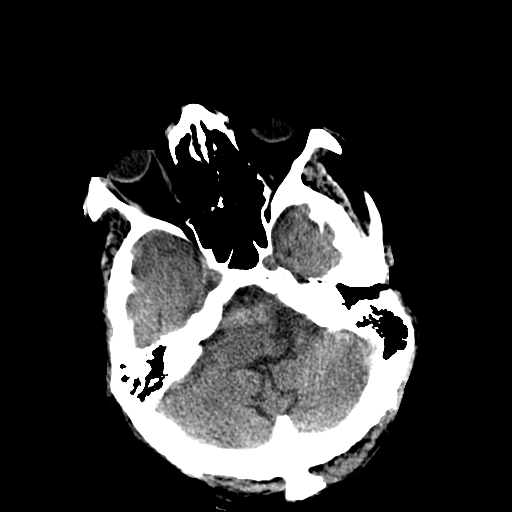
[im 4/48  bone]
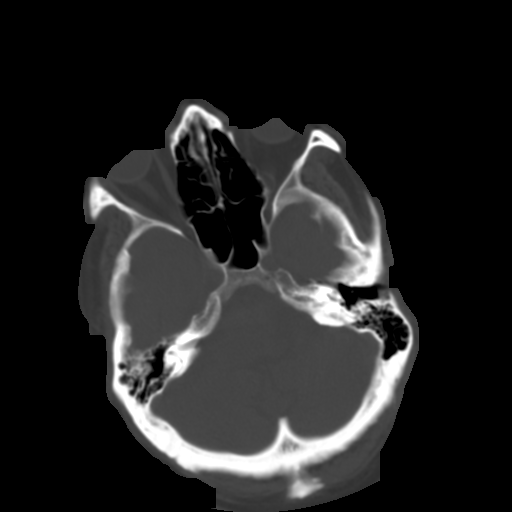
[im 7/48  brain]
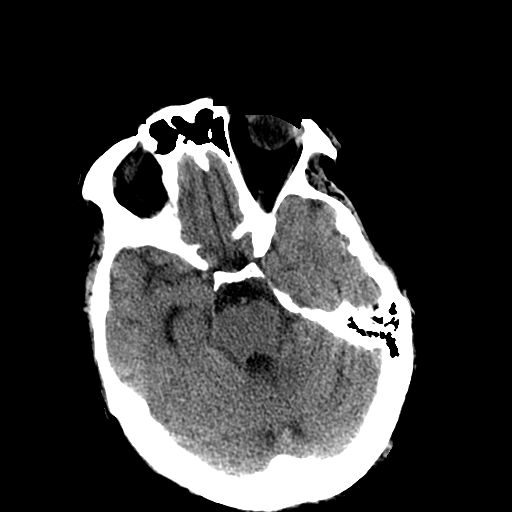
[im 11/48  brain]
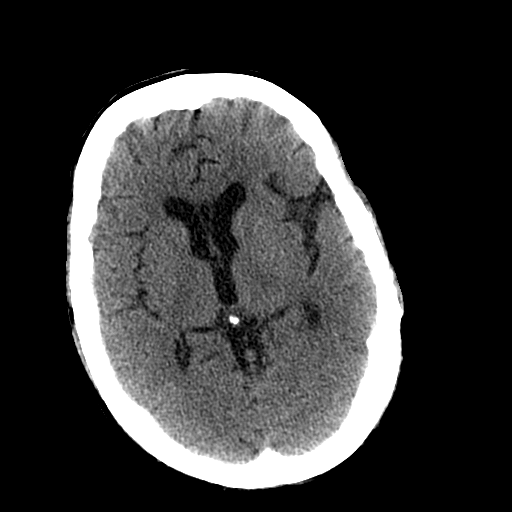
[im 14/48  brain]
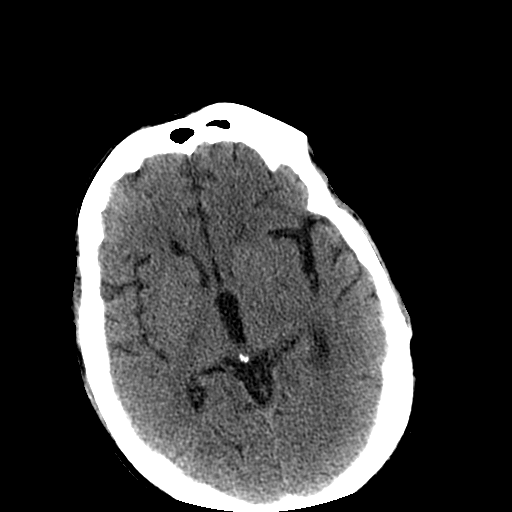
[im 17/48  brain]
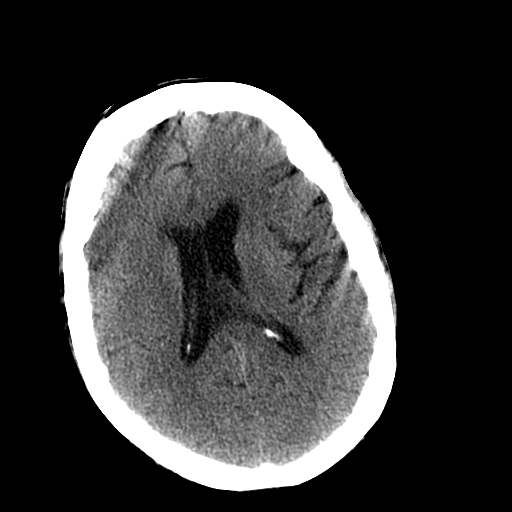
[im 17/48  bone]
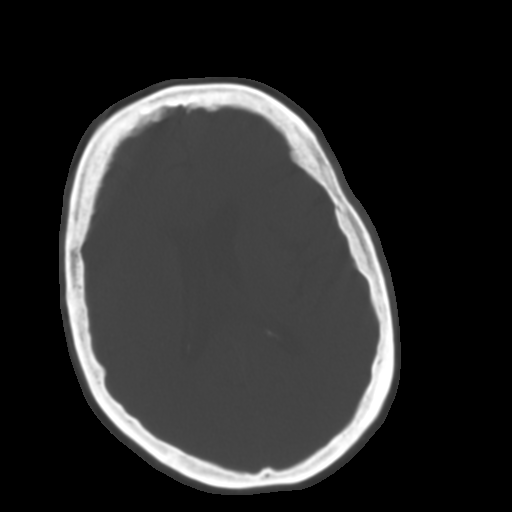
[im 21/48  brain]
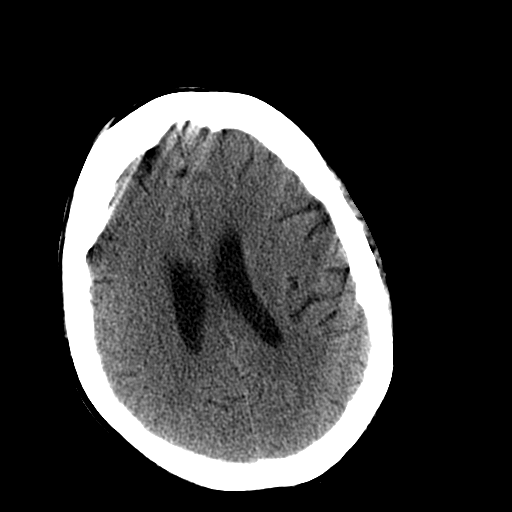
[im 24/48  brain]
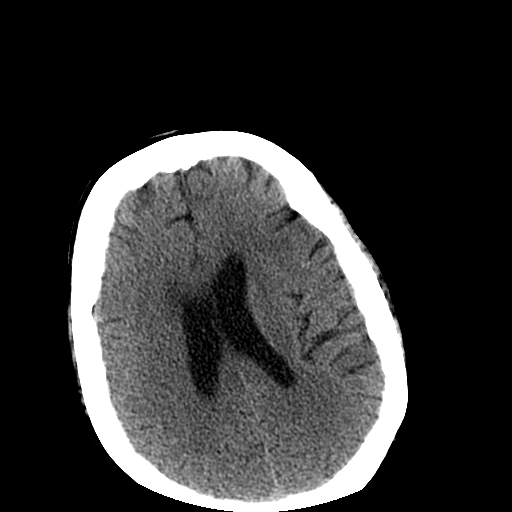
[im 27/48  brain]
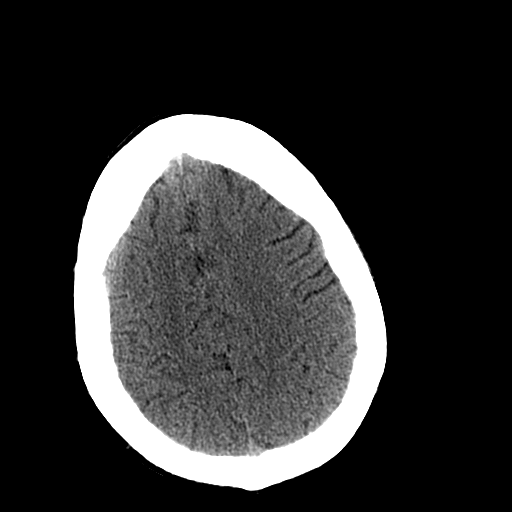
[im 31/48  brain]
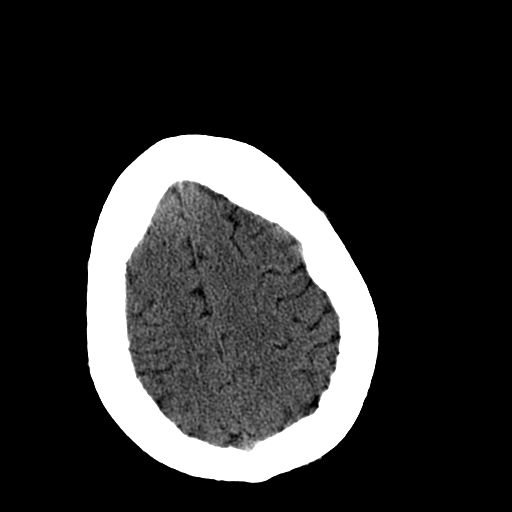
[im 31/48  bone]
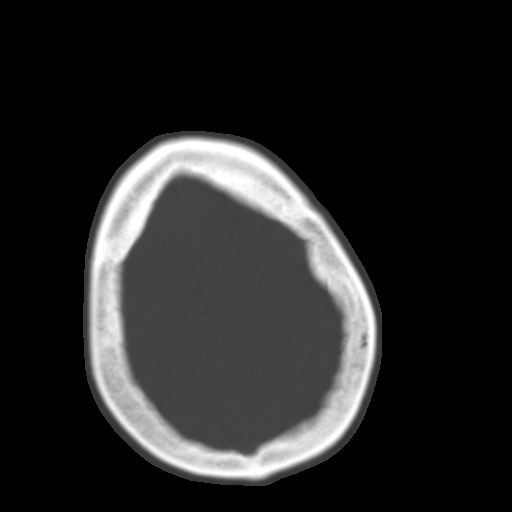
[im 34/48  brain]
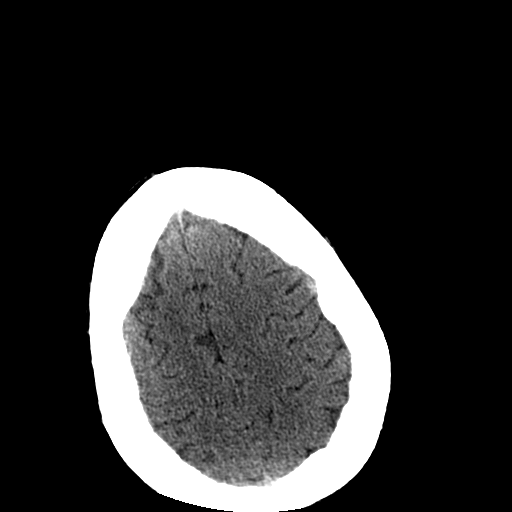
[im 37/48  brain]
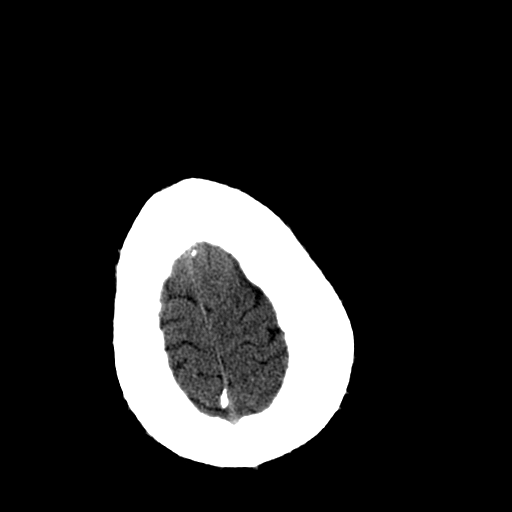
[im 41/48  brain]
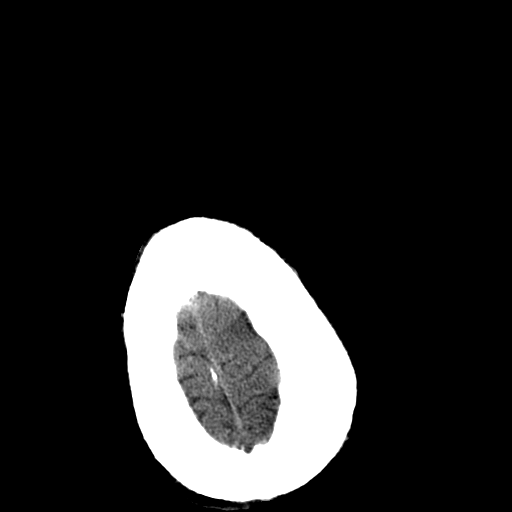
[im 44/48  brain]
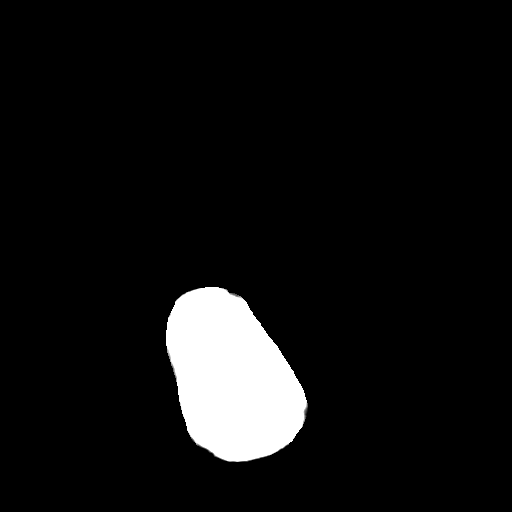
[im 44/48  bone]
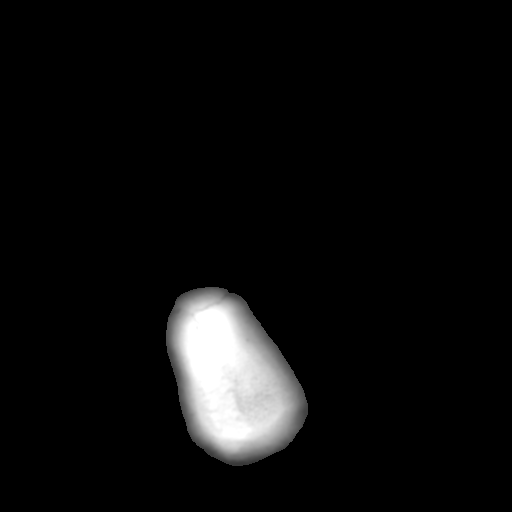

[Series 3: recon 2: brain · axial · 0.47mm/px · z∈[+144,+185]mm · 3 of 48 slices shown]
[im 4/48  brain]
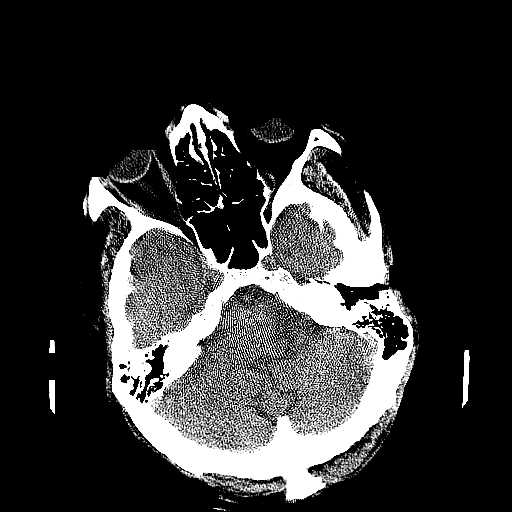
[im 11/48  brain]
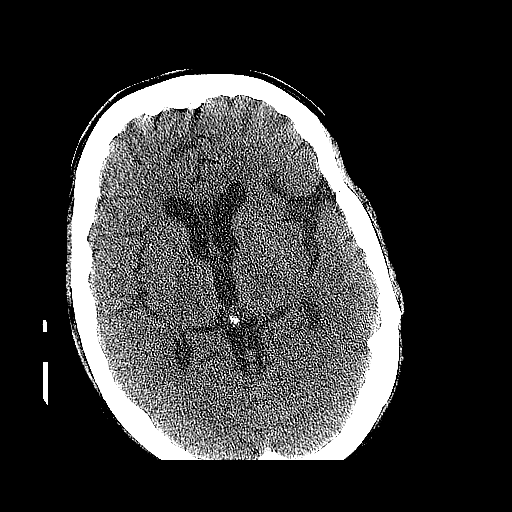
[im 17/48  brain]
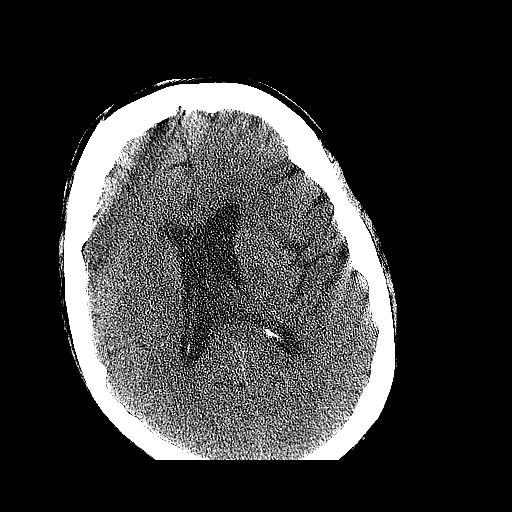

[16 of 30 positions shown; findings below may reference images not displayed]

FINDINGS: No mass lesion, mass effect, midline shift,
hydrocephalus, hemorrhage.  No territorial ischemia or acute
infarction. Posterior skull is excluded from view and repeated
images were performed to capture this area.  Incidental note made
of a cavum septum pellucidum.  Paranasal sinuses appear within
normal limits.
IMPRESSION: Negative CT head.

## 2010-04-06 IMAGING — US US RENAL
1 series · 14 of 15 positions shown · non-contrast
Comparison: None.

CLINICAL DATA: Renal insufficiency.  Right nephrectomy.
Hypertension.

RENAL/URINARY TRACT ULTRASOUND COMPLETE

[Series 1: us renal · 0.30mm/px · 14 of 15 slices shown]
[im 1/15]
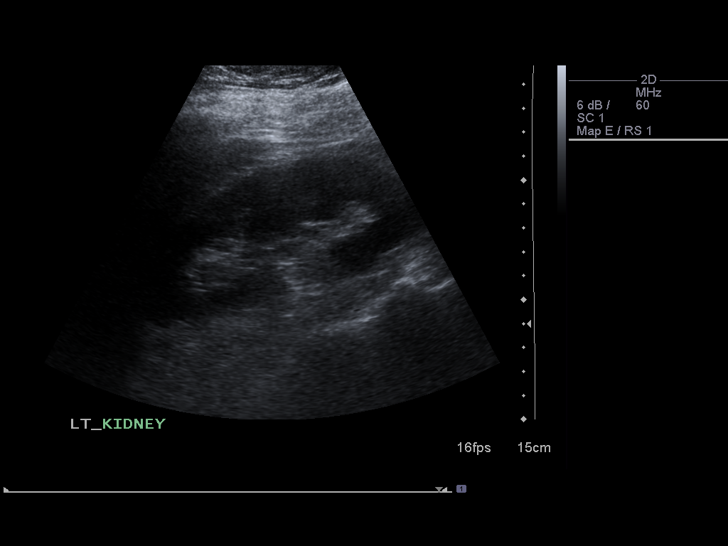
[im 2/15]
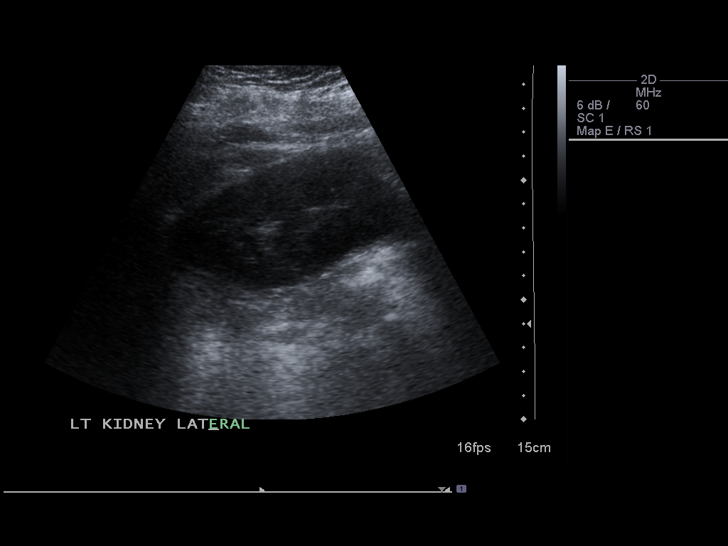
[im 3/15]
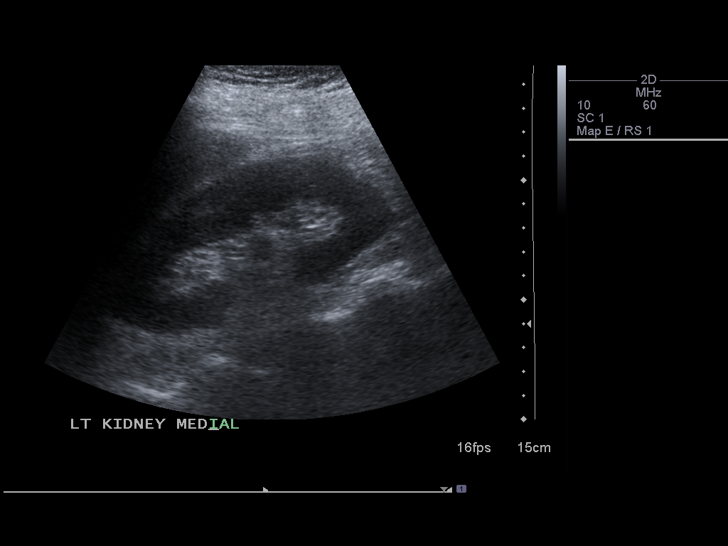
[im 4/15]
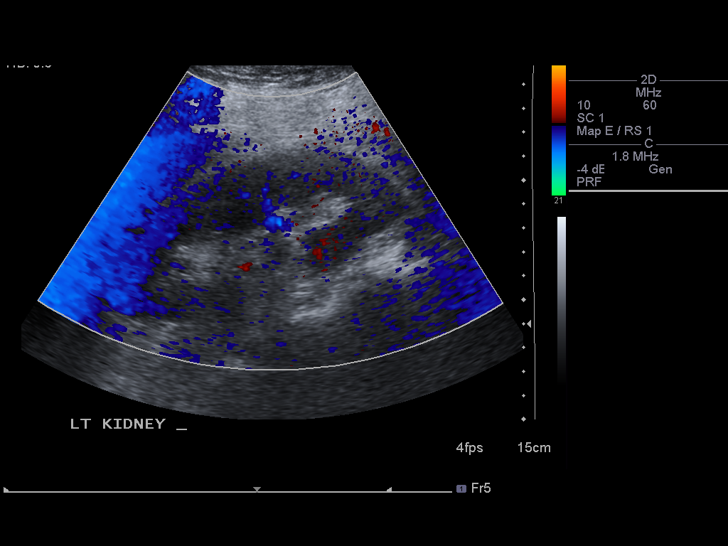
[im 5/15]
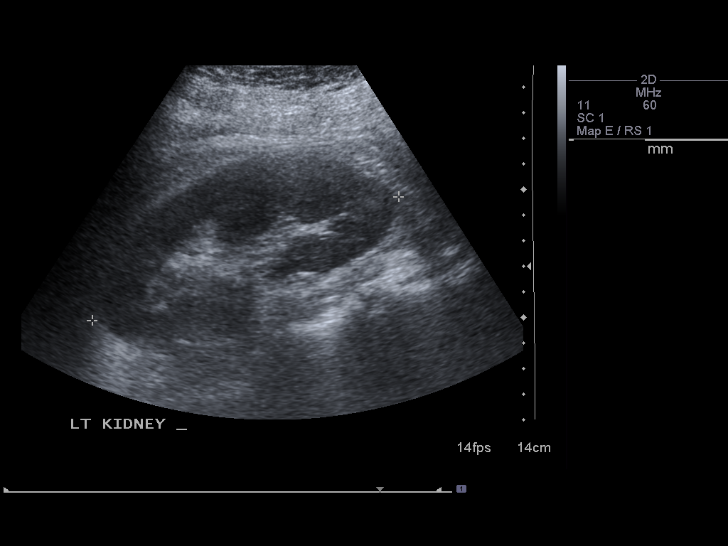
[im 6/15]
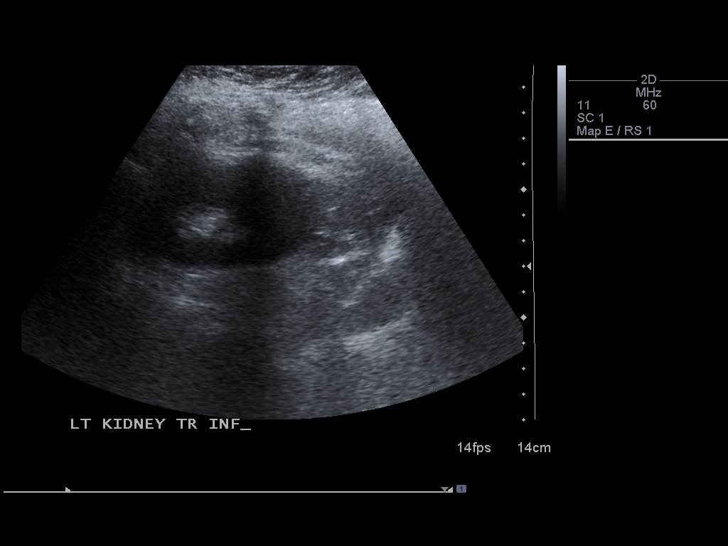
[im 7/15]
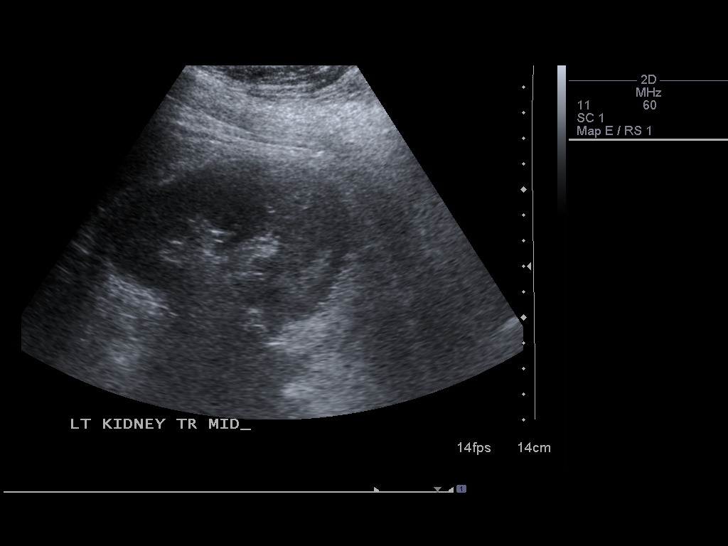
[im 9/15]
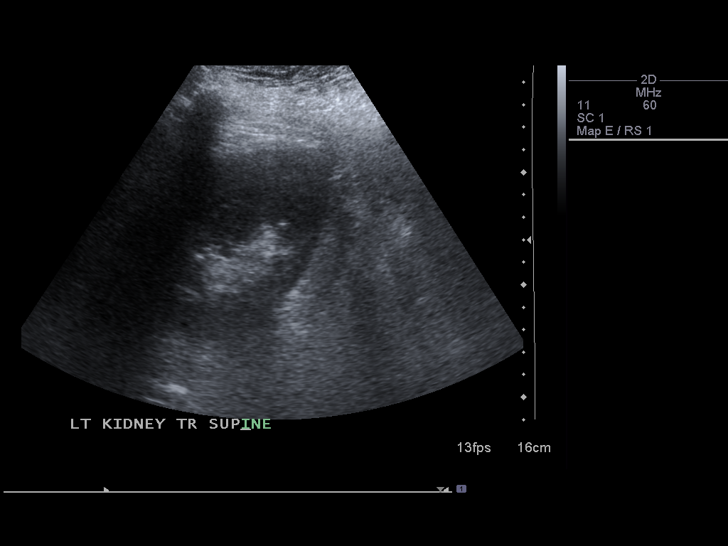
[im 10/15]
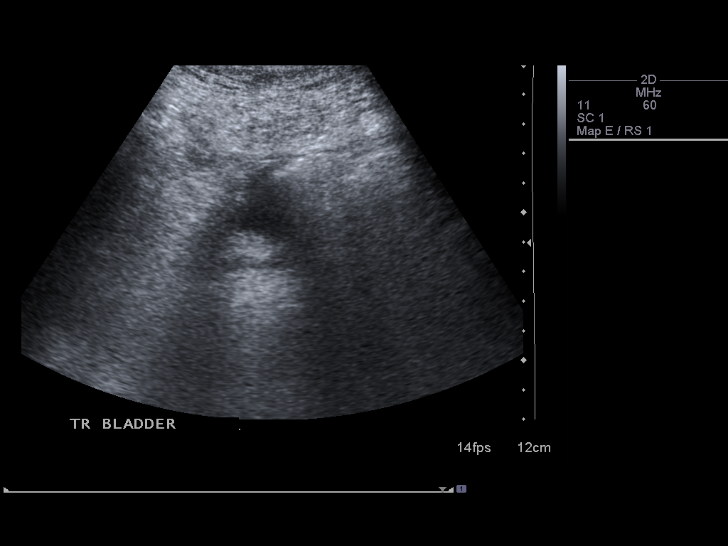
[im 11/15]
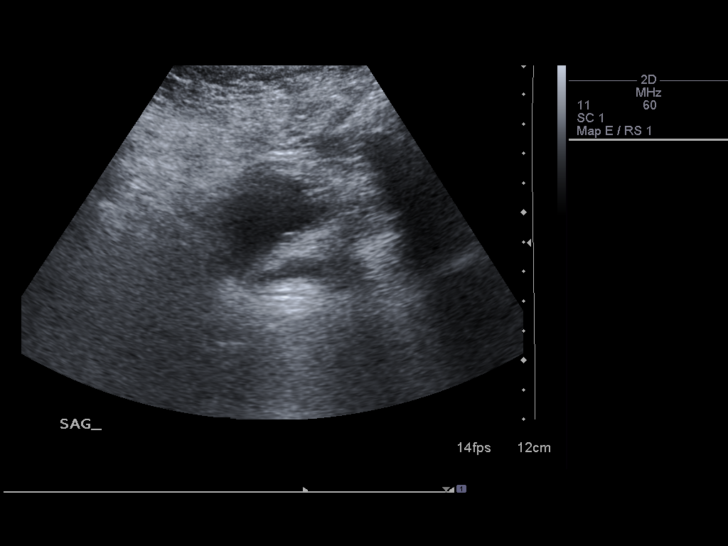
[im 12/15]
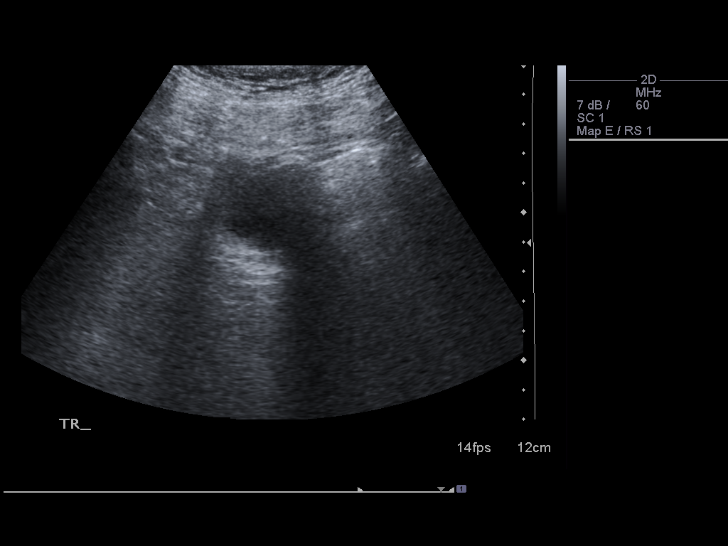
[im 13/15]
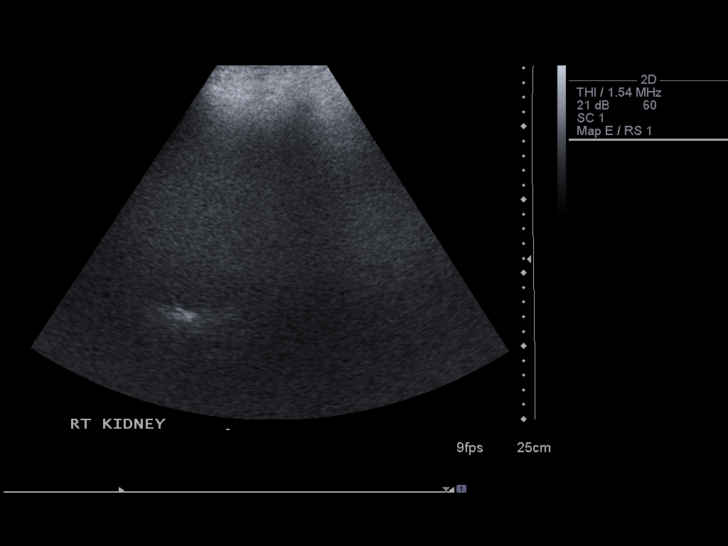
[im 14/15]
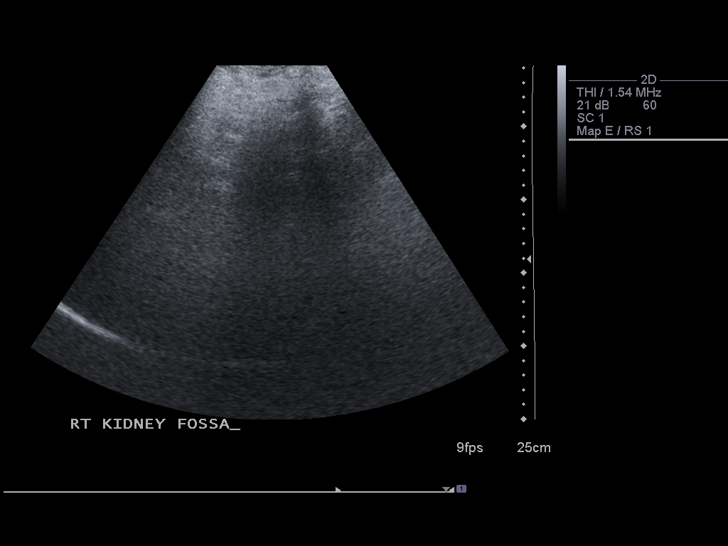
[im 15/15]
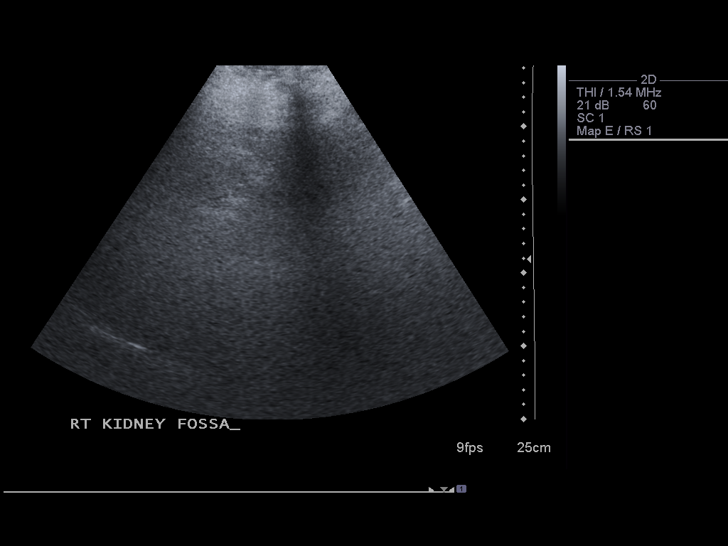

[14 of 15 positions shown; findings below may reference images not displayed]

FINDINGS: Right nephrectomy.  Left kidney measures 12.9 cm in length.  Normal
parenchymal echotexture and echogenicity of the left kidney.  No
hydronephrosis.
IMPRESSION: Right nephrectomy.  Unremarkable ultrasound appearance of the left
kidney.

## 2010-04-08 IMAGING — MR MR HEAD W/O CM
6 of 9 series · 31 of 48 positions shown · non-contrast
Comparison: None. CT scan of brain of 03/04/2010 and CT scan of the
brain of 01/19/2010

CLINICAL DATA: Altered mental status.  History of brain injury in
the past.

MRI HEAD WITHOUT CONTRAST
TECHNIQUE: Multiplanar, multiecho pulse sequences of the brain and
surrounding structures were obtained according to standard protocol
without intravenous contrast.

[Series 4: DWI · axial · 5.0mm · 1.09mm/px · z∈[-42,+100]mm · 8 of 56 slices shown (1 of 3)]
[im 1/56]
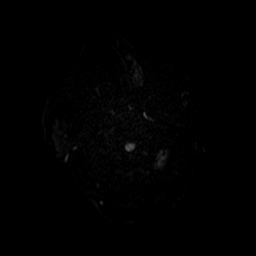
[im 7/56]
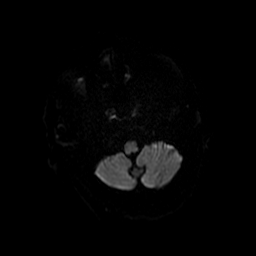
[im 19/56]
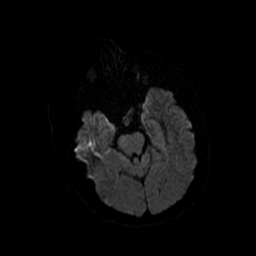
[im 25/56]
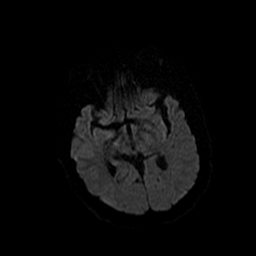
[im 31/56]
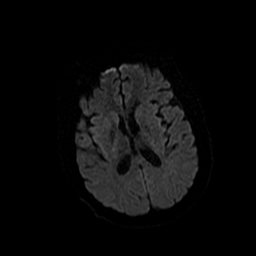
[im 37/56]
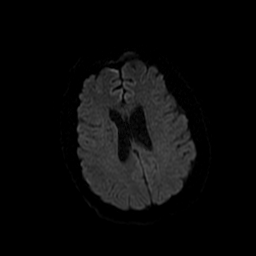
[im 49/56]
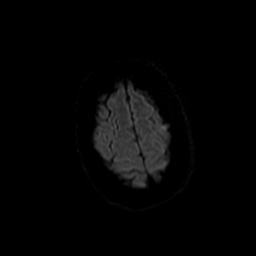
[im 56/56]
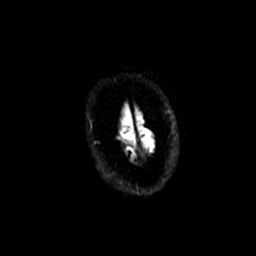

[Series 5: T2 · axial · 5.0mm · 0.47mm/px · z∈[-50,+106]mm · 4 of 24 slices shown]
[im 1/24]
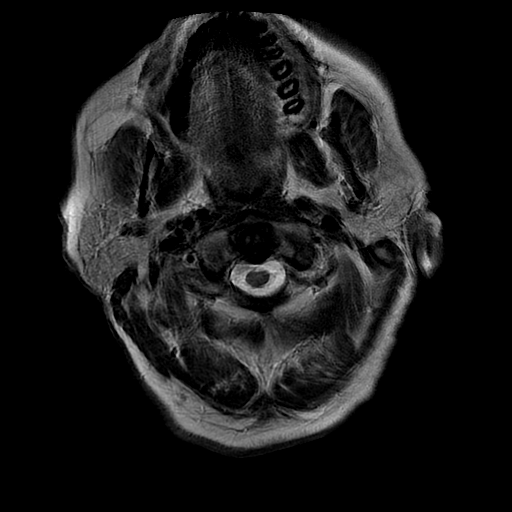
[im 8/24]
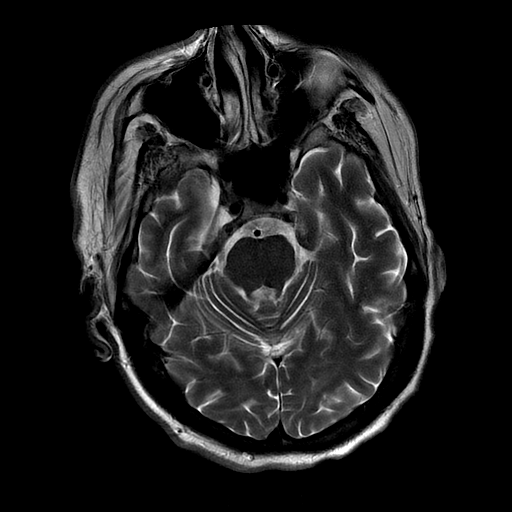
[im 16/24]
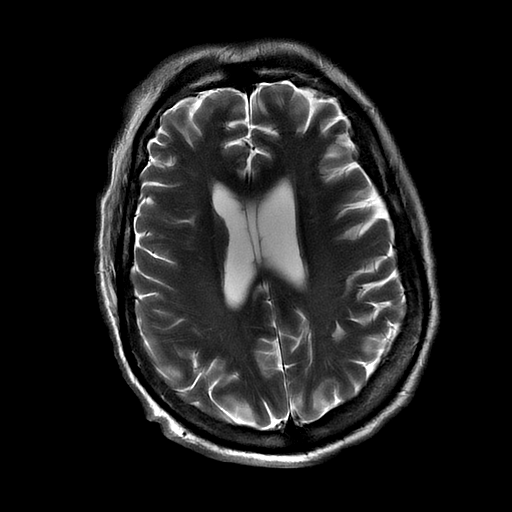
[im 24/24]
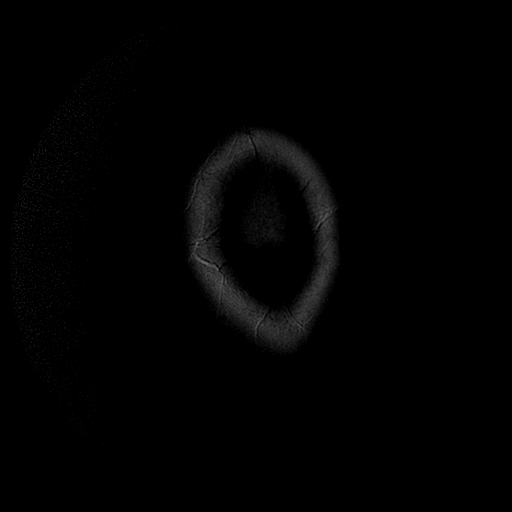

[Series 6: FLAIR · axial · 5.0mm · 0.43mm/px · z∈[-49,+107]mm · 4 of 24 slices shown]
[im 1/24]
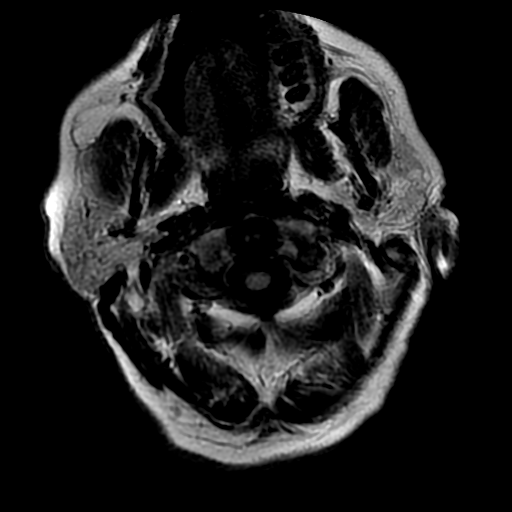
[im 8/24]
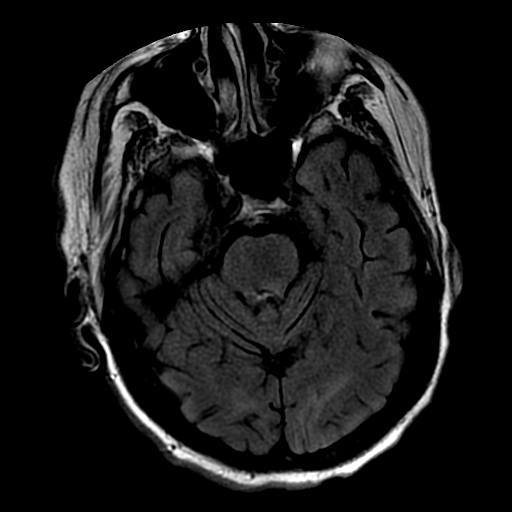
[im 16/24]
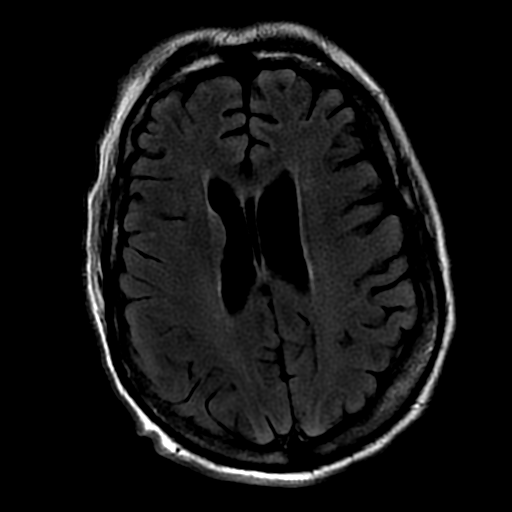
[im 24/24]
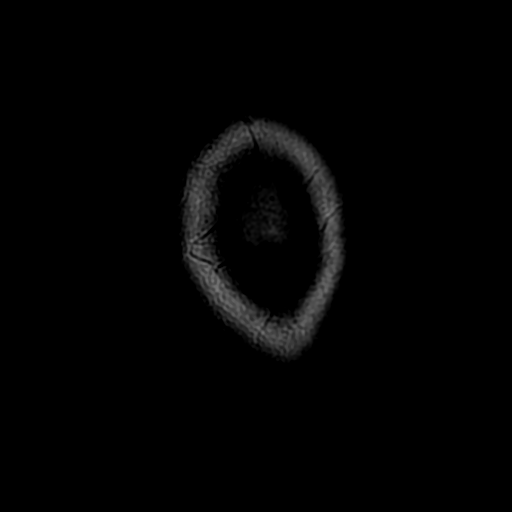

[Series 10: T2 post-contrast · coronal · 5.0mm · 0.39mm/px · 5 of 29 slices shown]
[im 1/29]
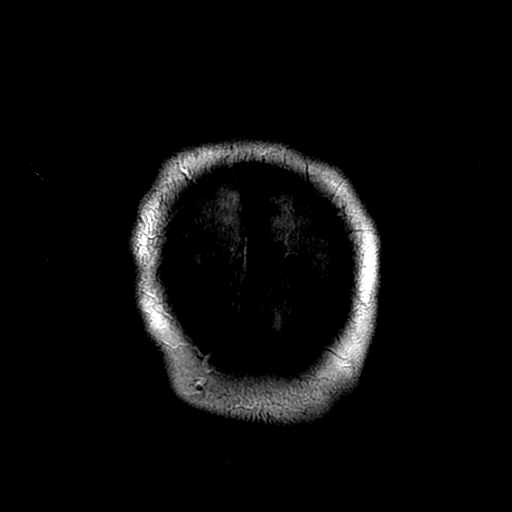
[im 8/29]
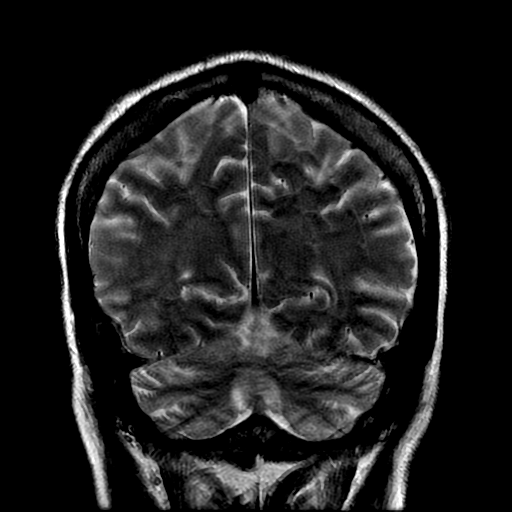
[im 15/29]
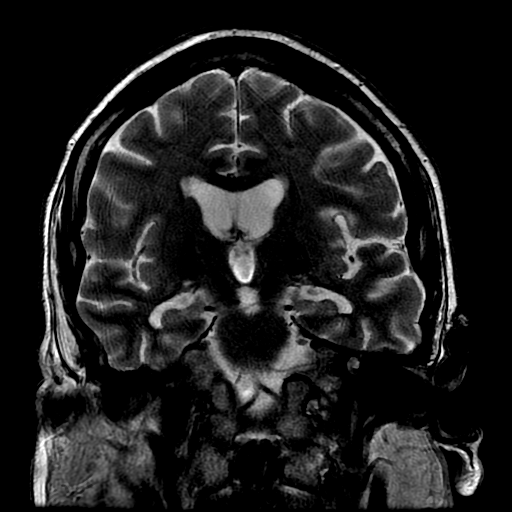
[im 22/29]
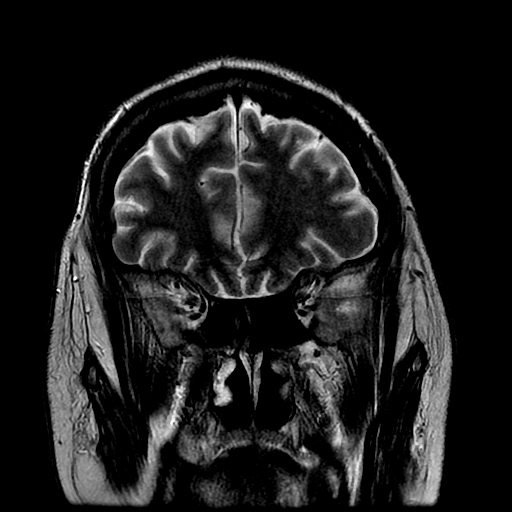
[im 29/29]
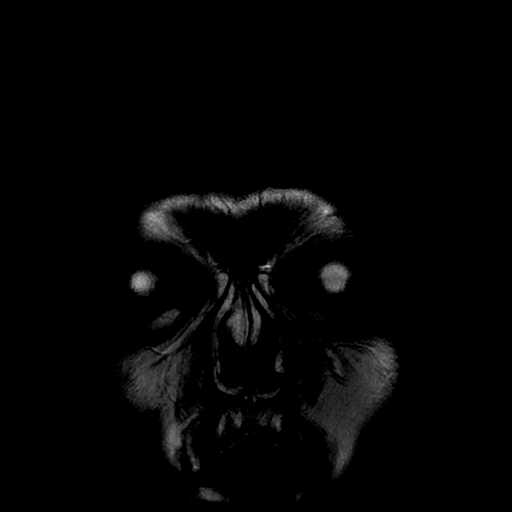

[Series 400: DWI · axial · 5.0mm · 1.09mm/px · z∈[-42,+100]mm · 5 of 28 slices shown (2 of 3)]
[im 1/28]
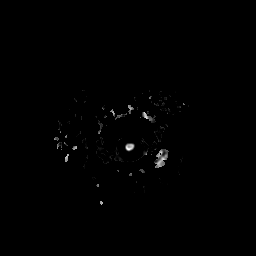
[im 7/28]
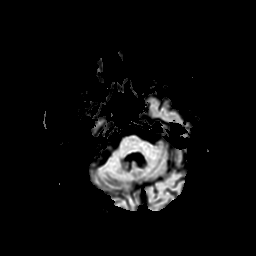
[im 14/28]
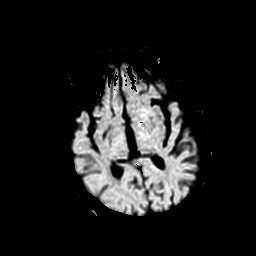
[im 21/28]
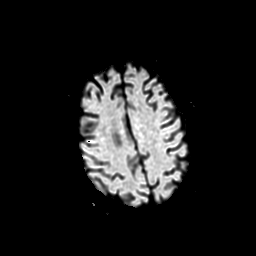
[im 28/28]
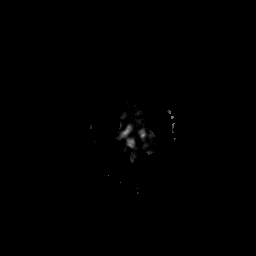

[Series 401: DWI · axial · 5.0mm · 1.09mm/px · z∈[-42,+100]mm · 5 of 28 slices shown (3 of 3)]
[im 1/28]
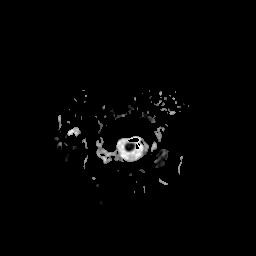
[im 7/28]
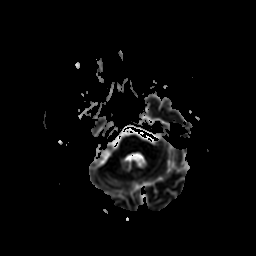
[im 14/28]
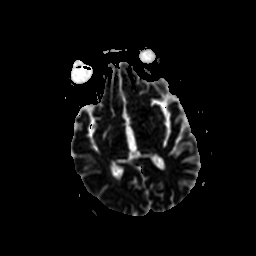
[im 21/28]
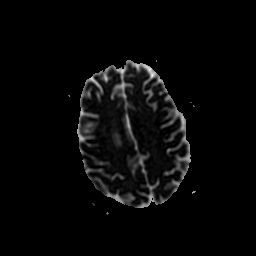
[im 28/28]
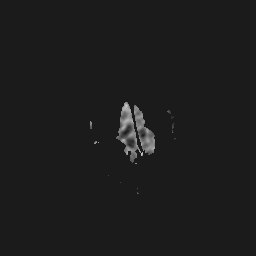

[31 of 48 positions shown; findings below may reference images not displayed]

FINDINGS: Gray white matter differentiation is normal.  The sella
is appropriate in signal  and morphology for patient's age.  Clival
signal is intact .  Cerebellar tonsils are above the level of the
foramen magnum.

The odontoid process, the predental space and the prevertebral soft
tissues are within normal limits.

No diffusion weighted abnormalities seen.

Axial FLAIR and T2-weighted images demonstrate mild prominence of
the sulci overlying the cerebral convexities and also of the
cerebellar folia.

Few scattered subcortical hyperintensities  are seen in the frontal
regions bilaterally ,right greater than left.

A cavum septum pellucidum  , a developmental variation is noted.

Mastoids are clear.  The internal auditory canals are symmetrical
in  signal and morphology.

Mild mucosal thickening of the maxillary sinuses and to a  lesser
degree in the ethmoid air cells is seen.  Visualized orbital
contents are grossly unremarkable.

No abnormal blood breakdown products seen on SPGR sequences.
IMPRESSION: 1.  No evidence of acute ischemia.
2.  Few nonspecific subcortical white matter changes bifrontally.
These may represent areas of ischemic gliosis related  to small
vessel disease due to hypertension and/ or diabetes versus less
likely possibility of a demyelinating process or vasculitis.
3.  Mild inflammatory thickening of the mucosa in the maxillary
sinuses and the ethmoid air cells.
4.  Mild diffuse atrophy

## 2010-04-21 IMAGING — CR DG ABDOMEN 1V
2 series · 2 of 2 positions shown · non-contrast
Comparison: None.

CLINICAL DATA: G tube removed, replaced in the emergency room.

ABDOMEN - 1 VIEW

[view not recorded (1 of 2)]
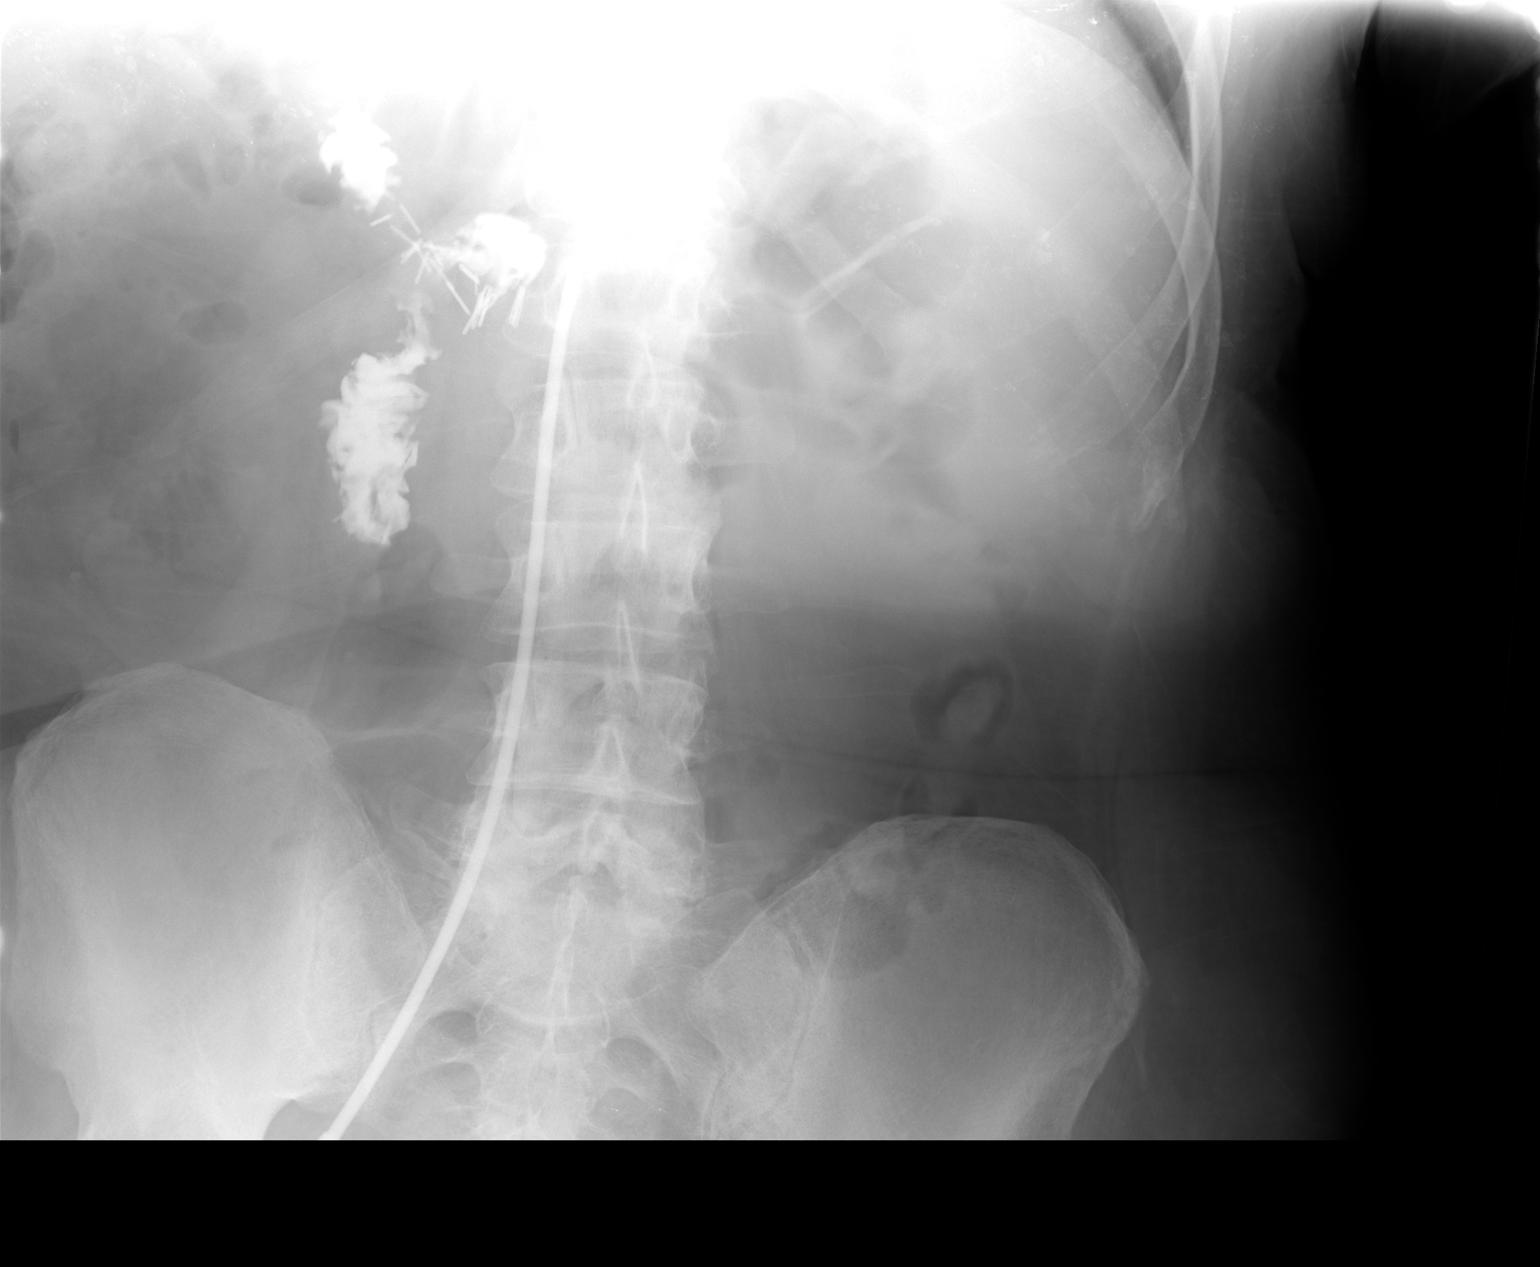

[view not recorded (2 of 2)]
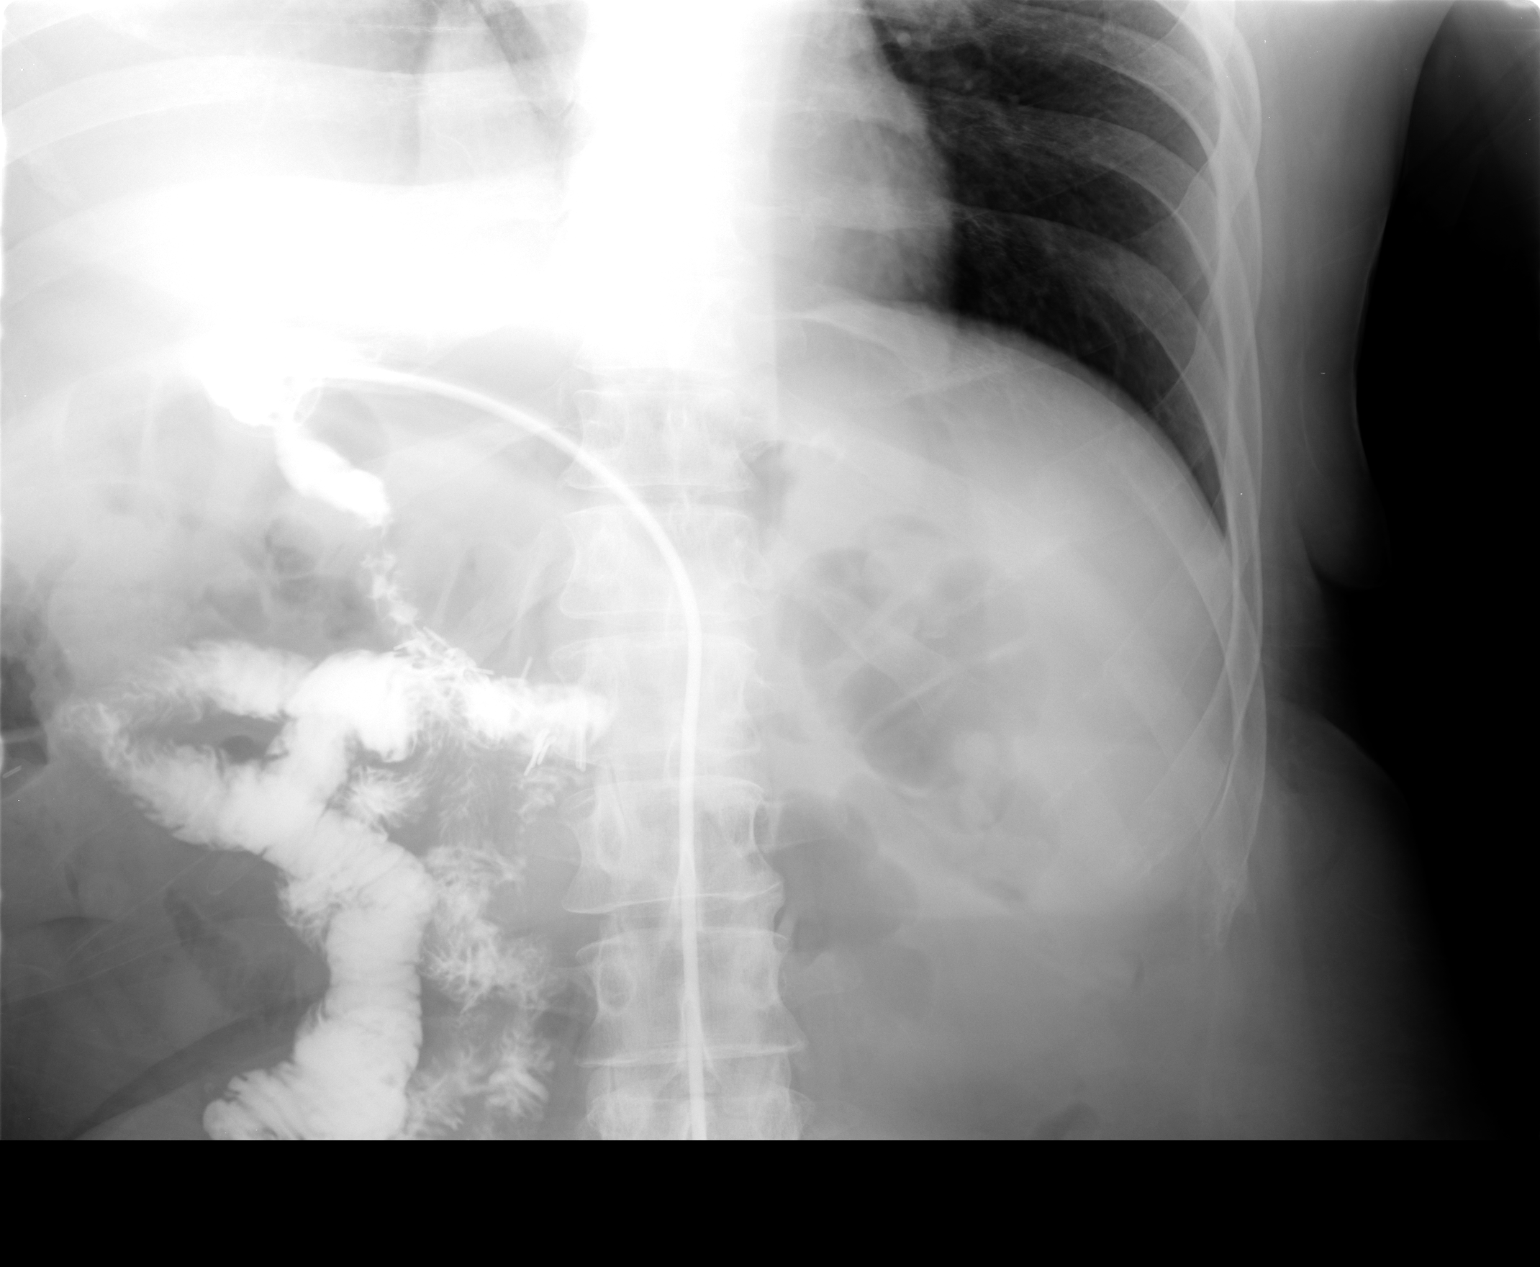

[2 of 2 positions shown; findings below may reference images not displayed]

FINDINGS: Existing feeding tube was injected with contrast.
Imaging was performed of the abdomen.  The tip of the catheter is
within the duodenal bulb.  Contrast opacifies small bowel in the
right abdomen.
IMPRESSION: Feeding tube tip in the duodenal bulb.

## 2010-04-22 IMAGING — XA IR BILIARY CATHETER EXCHANGE
1 series · 2 of 2 positions shown · non-contrast
Comparison: none

CLINICAL HISTORY: Gastrostomy tube fell out and a Foley catheter was
placed temporarily.

[Series 300: sp perc tube chg w/cm · 2 of 2 slices shown]
[im 1/2]
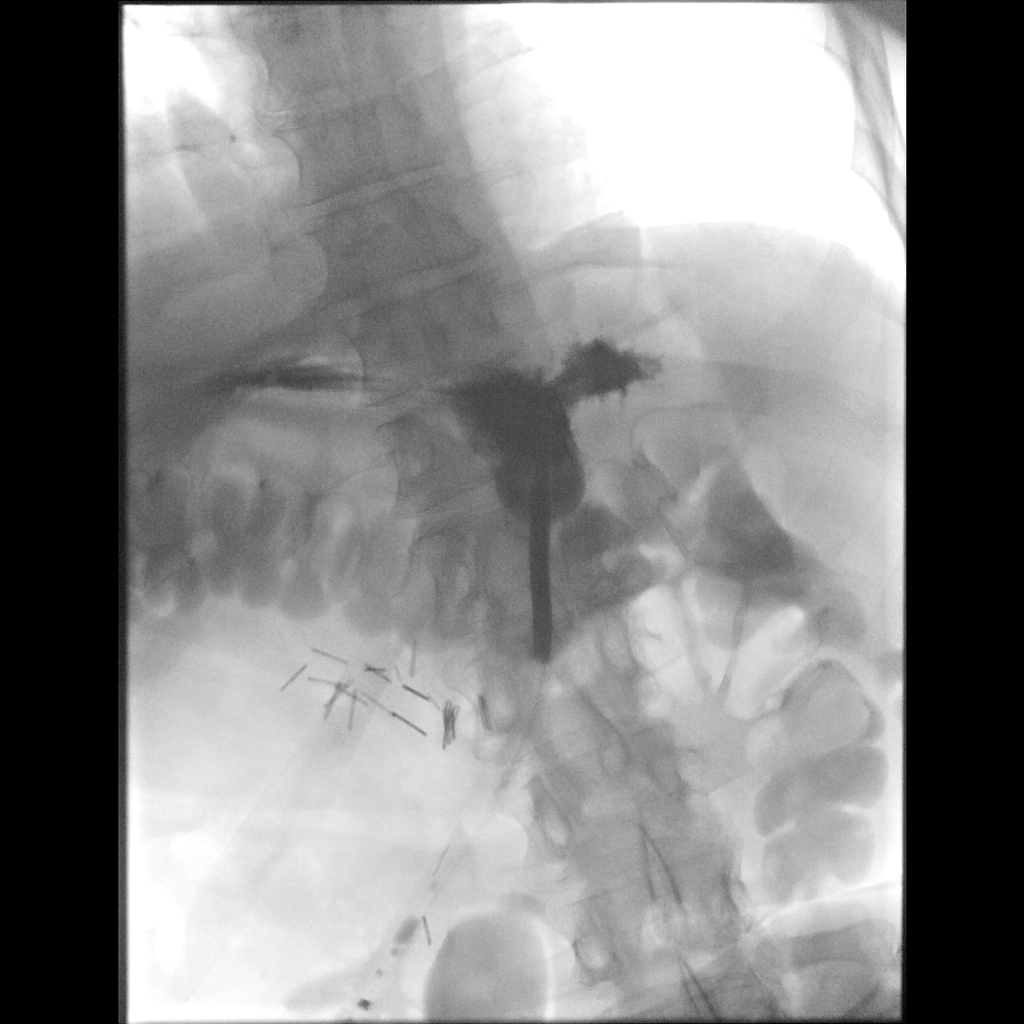
[im 2/2]
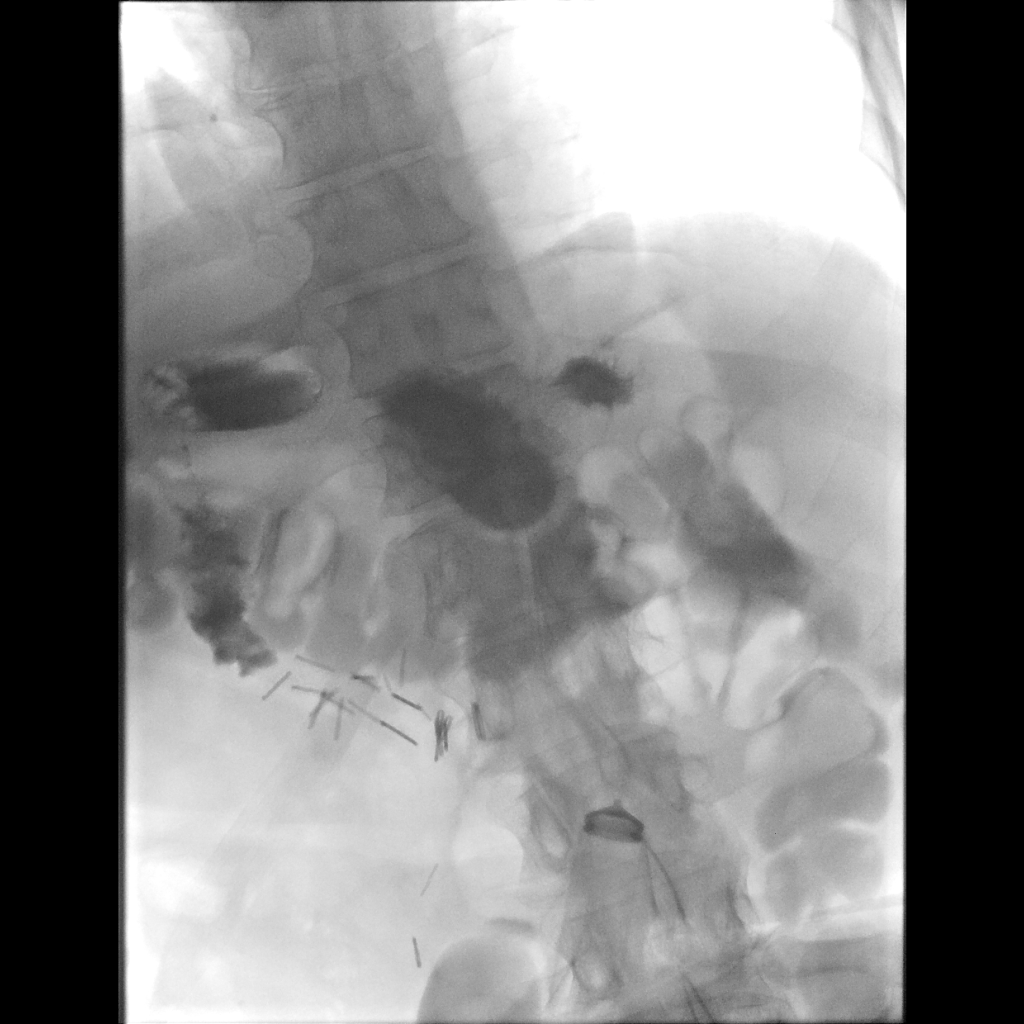

[2 of 2 positions shown; findings below may reference images not displayed]

PROCEDURE(S): REPLACEMENT OF GASTROSTOMY TUBE

Medications:None

Moderate sedation time:None

Fluoroscopy time: 2.1 minutes

Contrast:  10 ml [id number]

Procedure:Informed consent was obtained for the procedure.  Foley
catheter was removed and replaced with 26 French balloon retention
gastrostomy tube. The balloon was inflated and contrast was
injected through the tube with fluoroscopy. Fluoroscopic images
were taken and saved for this procedure. .
FINDINGS: Gastrostomy tube is positioned in the stomach
IMPRESSION: Successful replacement of the percutaneous gastrostomy
tube.

## 2010-05-05 ENCOUNTER — Ambulatory Visit (HOSPITAL_COMMUNITY): Admission: RE | Admit: 2010-05-05 | Discharge: 2010-05-05 | Payer: Self-pay | Admitting: Internal Medicine

## 2010-05-05 IMAGING — CR DG ABDOMEN 1V
2 series · 2 of 2 positions shown · non-contrast
Comparison: 03/20/2010.

CLINICAL DATA: Replacement of gastrostomy tube.

ABDOMEN - 1 VIEW

[t abdomen supine (1 of 2)]
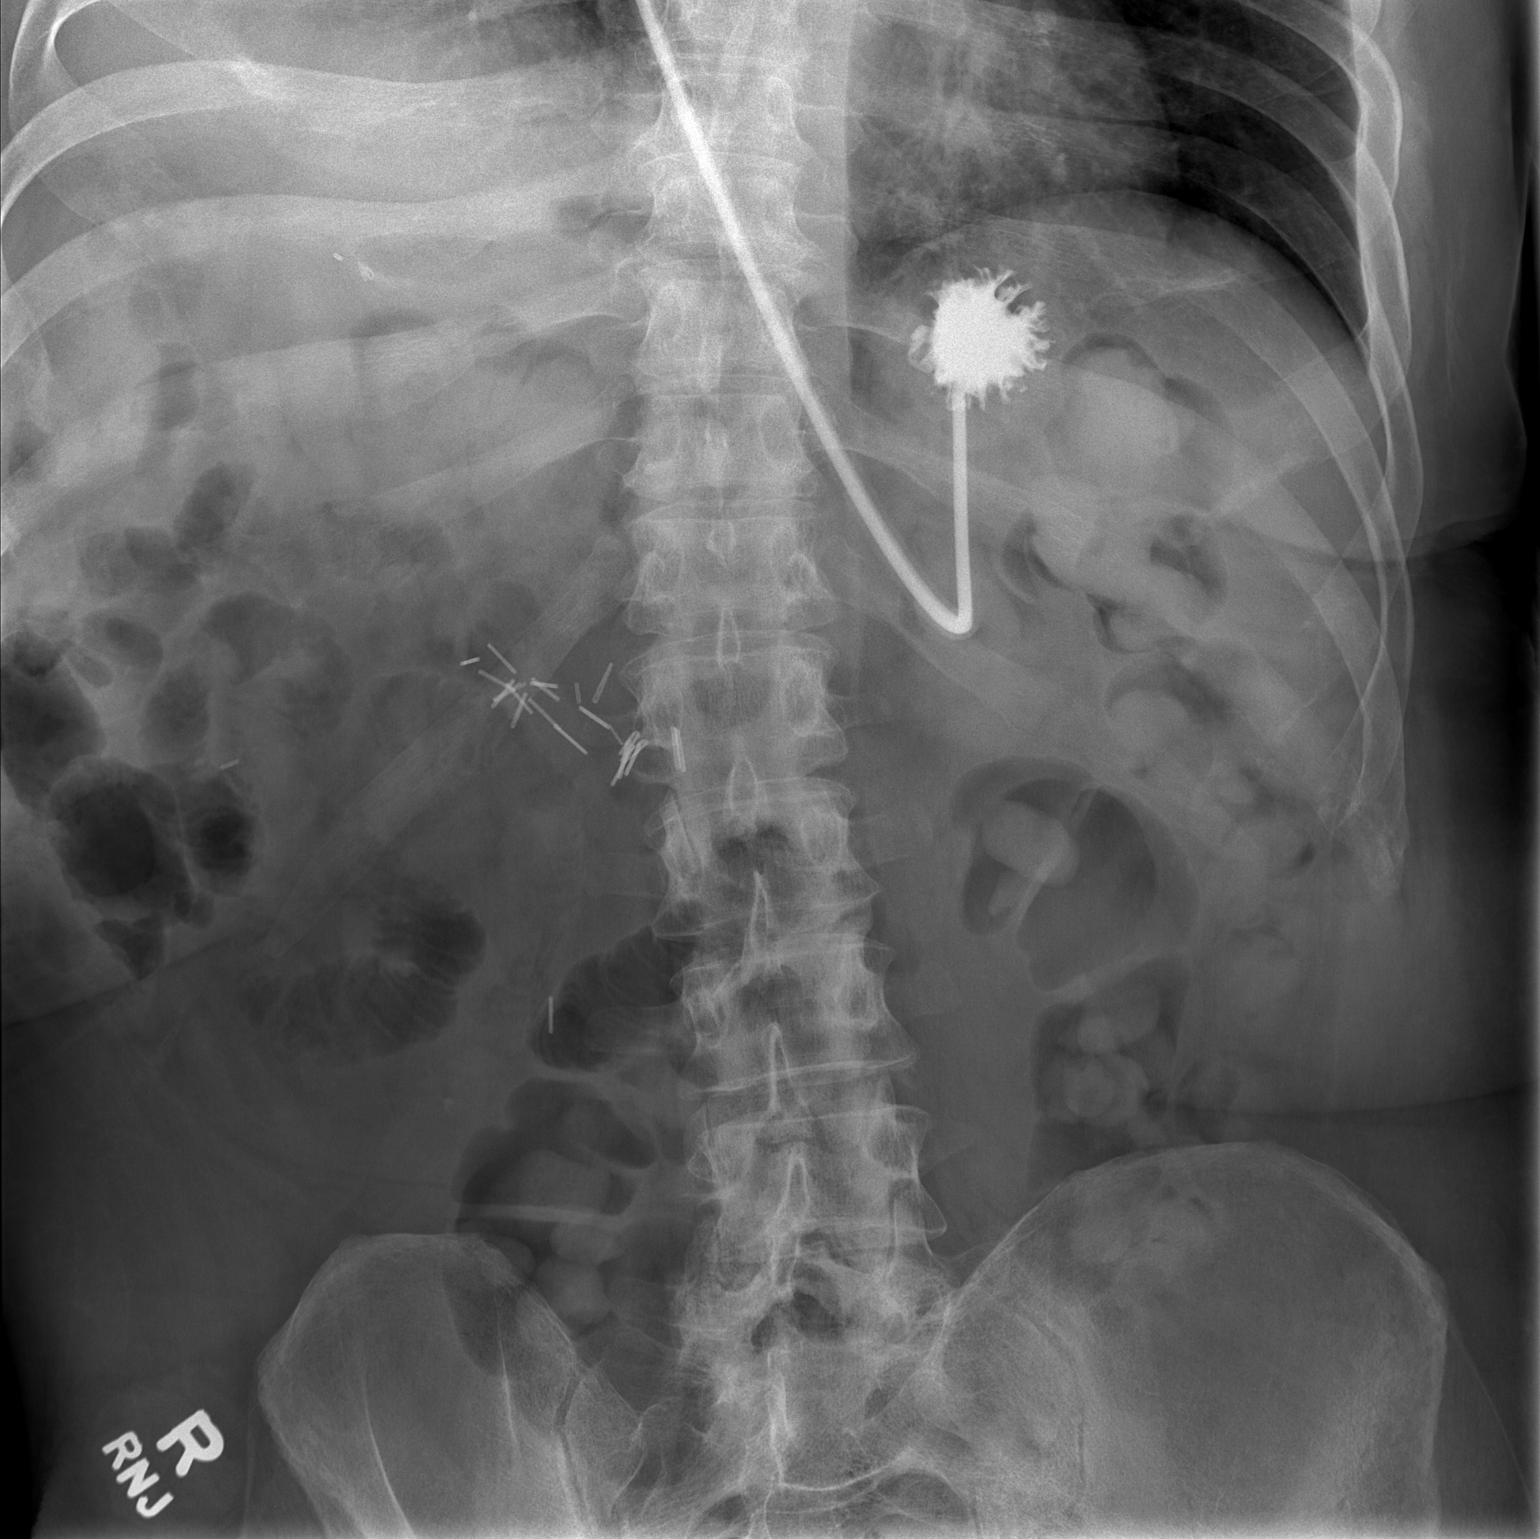

[t abdomen supine (2 of 2)]
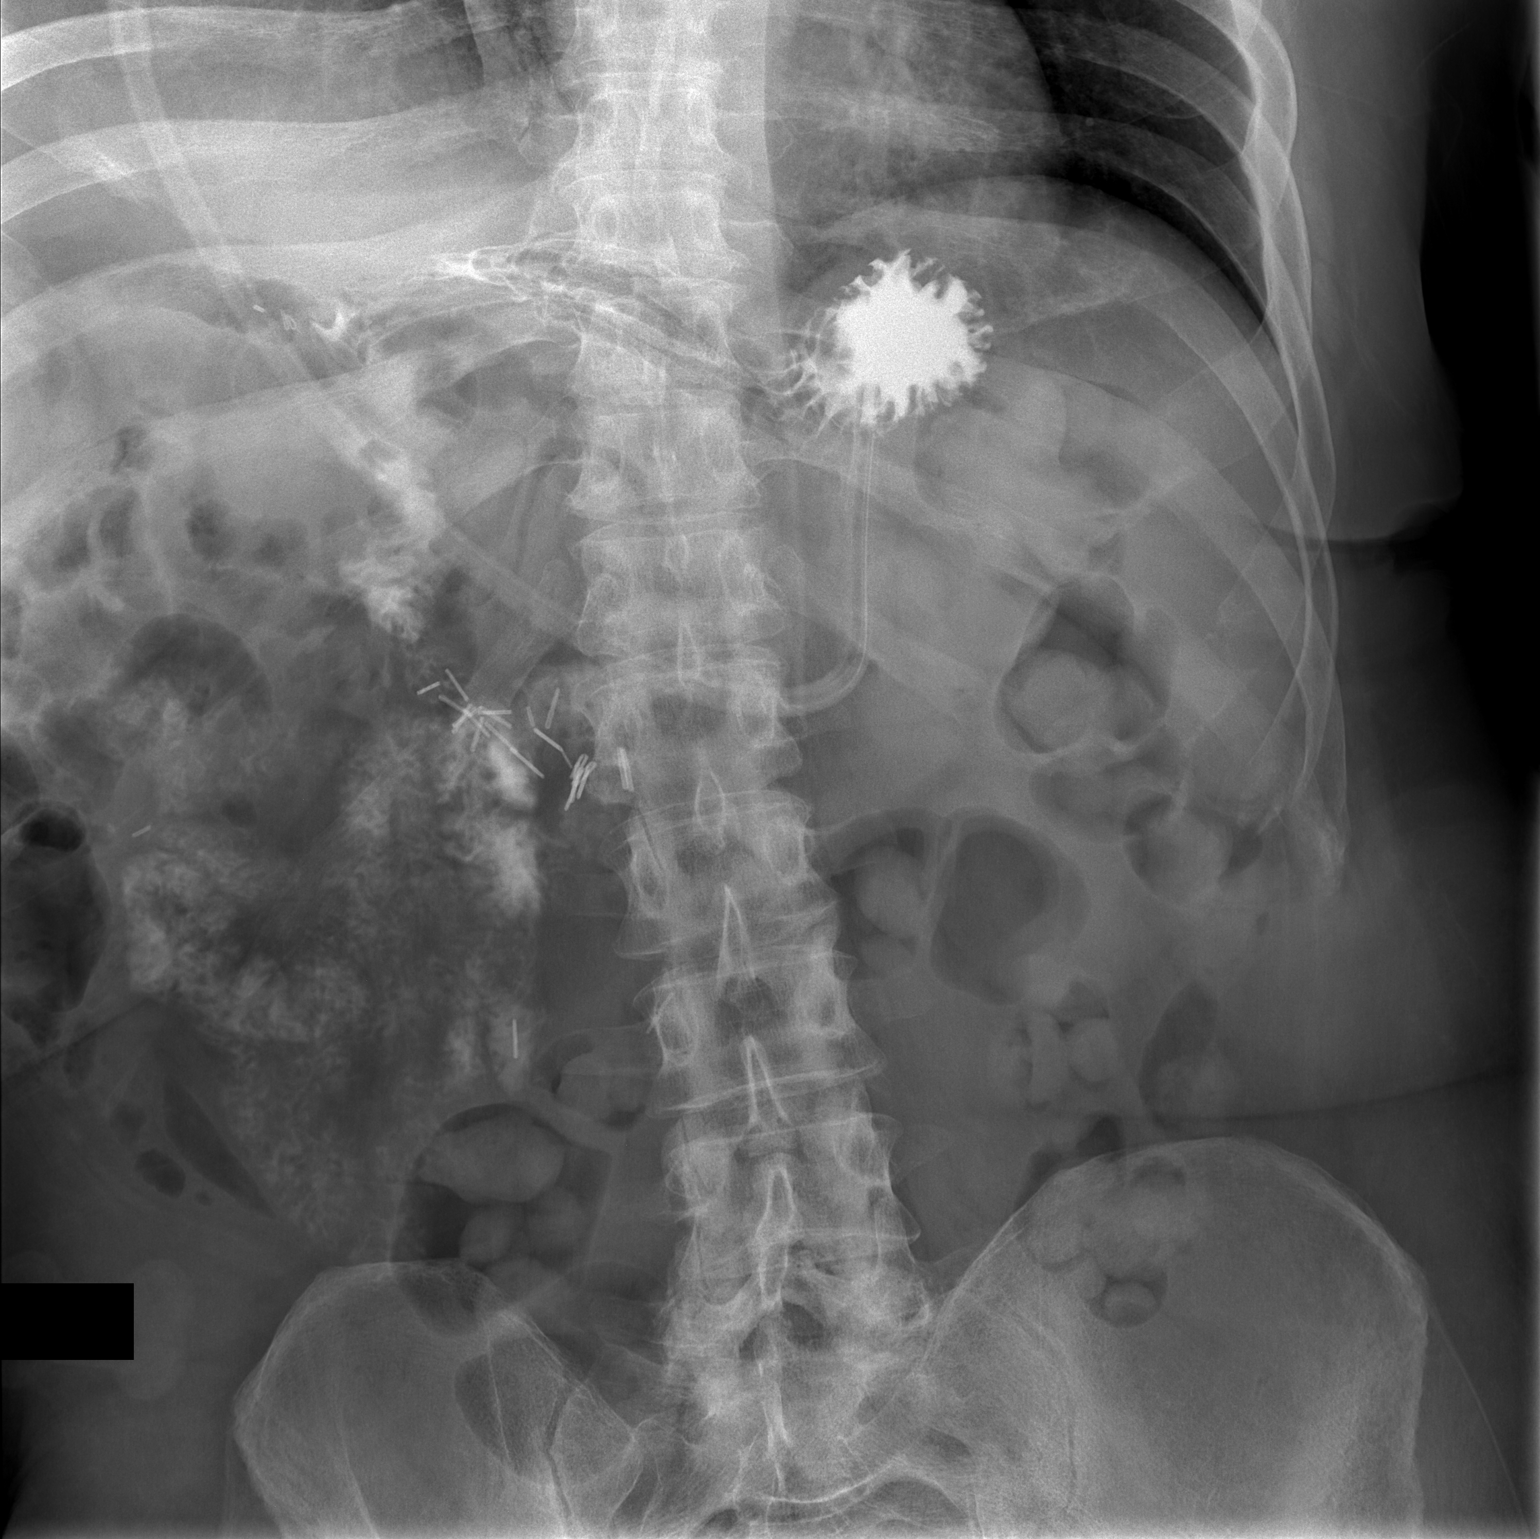

[2 of 2 positions shown; findings below may reference images not displayed]

FINDINGS: 15 ml water-soluble contrast was instilled.  Image
obtained.  A second bolus of 15 ml of contrast was instilled.
Gastrostomy tube tip is in the region of the gastric body.
Contrast traversed into the gastric antrum and proximal small bowel
which is located to the right of midline.  Ligament of Treitz to
the right midline.

Elevated right hemidiaphragm limits evaluation the right lung base.
Surgical clips right aspect of the pelvis.  Nonspecific bowel gas
pattern. The possibility of free intraperitoneal air cannot be
addressed on a supine view.
IMPRESSION: Gastrostomy tube tip is within the gastric body.

Proximal small bowel located to right midline with ligament of
Treitz to the right of midline.

## 2010-06-07 IMAGING — XA IR REPLACE G-TUBE/COLONIC TUBE
1 series · 1 of 1 positions shown · non-contrast
Comparison: 04/02/2010

CLINICAL DATA: Chronic tube feeds, gastrostomy accidentally
removed, Foley catheter within the percutaneous tract

GASTROSTOMY CATHETER REPLACEMENT

[Series 1: fl neuro · 1 of 1 slices shown]
[im 1/1]
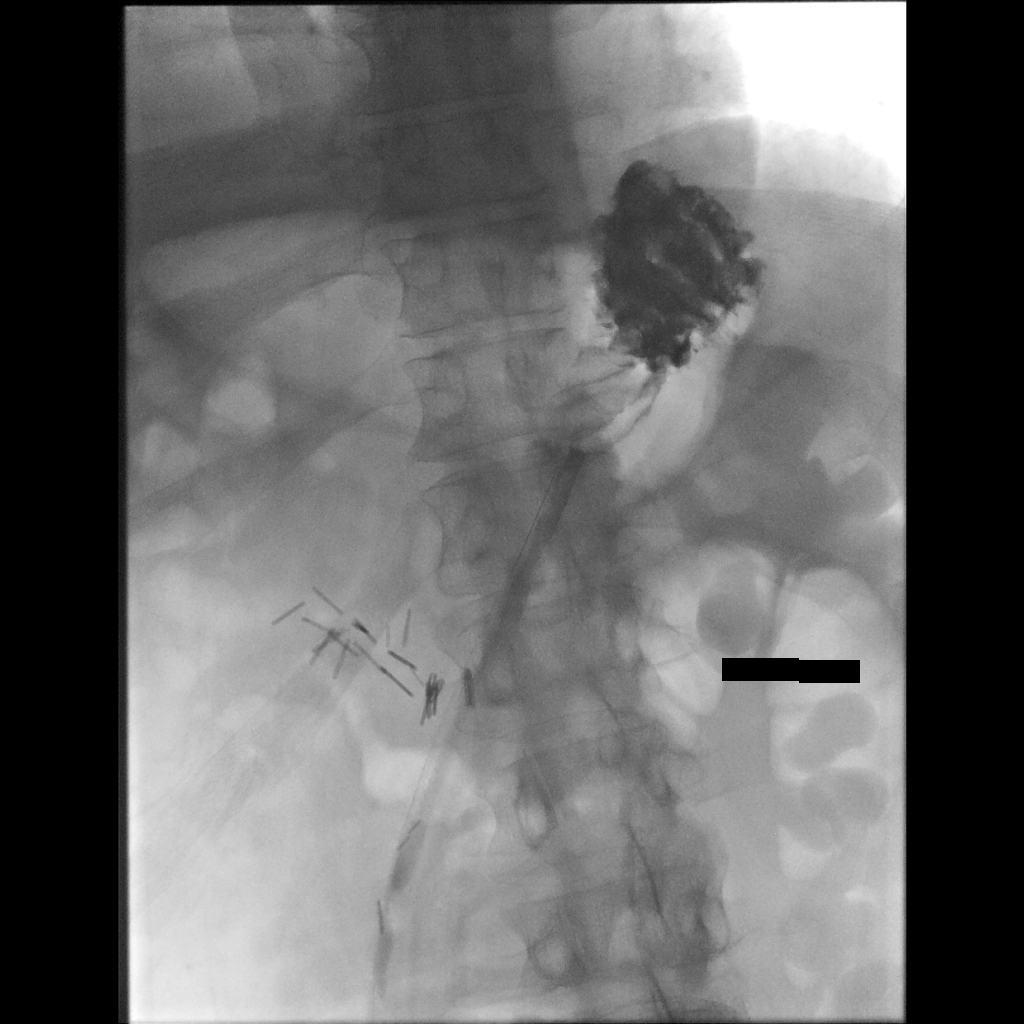

[1 of 1 positions shown; findings below may reference images not displayed]

FINDINGS: The Foley catheter within the existing percutaneous tract
was removed.  A new 26-French gastrostomy was advanced into the
percutaneous tract.  Retention balloon was inflated with 10 ml of
saline.  This was retracted against anterior gastric wall.
Contrast injection confirms position in the stomach.  Images
obtained for documentation.  Gastrostomy was secured externally.
No immediate complication.  The patient tolerated the procedure
well.
IMPRESSION: Fluoroscopic replacement of a 26-French gastrostomy confirmed in
the stomach ready for use.

## 2010-06-15 ENCOUNTER — Emergency Department (HOSPITAL_COMMUNITY): Admission: EM | Admit: 2010-06-15 | Discharge: 2010-06-16 | Payer: Self-pay | Admitting: Emergency Medicine

## 2010-06-18 ENCOUNTER — Ambulatory Visit: Payer: Self-pay | Admitting: Internal Medicine

## 2010-06-19 ENCOUNTER — Encounter (INDEPENDENT_AMBULATORY_CARE_PROVIDER_SITE_OTHER): Payer: Self-pay | Admitting: Internal Medicine

## 2010-06-19 ENCOUNTER — Inpatient Hospital Stay (HOSPITAL_COMMUNITY): Admission: EM | Admit: 2010-06-19 | Discharge: 2010-06-25 | Payer: Self-pay | Admitting: Emergency Medicine

## 2010-07-18 IMAGING — CR DG CHEST 1V PORT
1 series · 1 of 1 positions shown · non-contrast
Comparison: 03/04/2010

CLINICAL DATA: Shortness of breath

PORTABLE CHEST - 1 VIEW

[series 1]
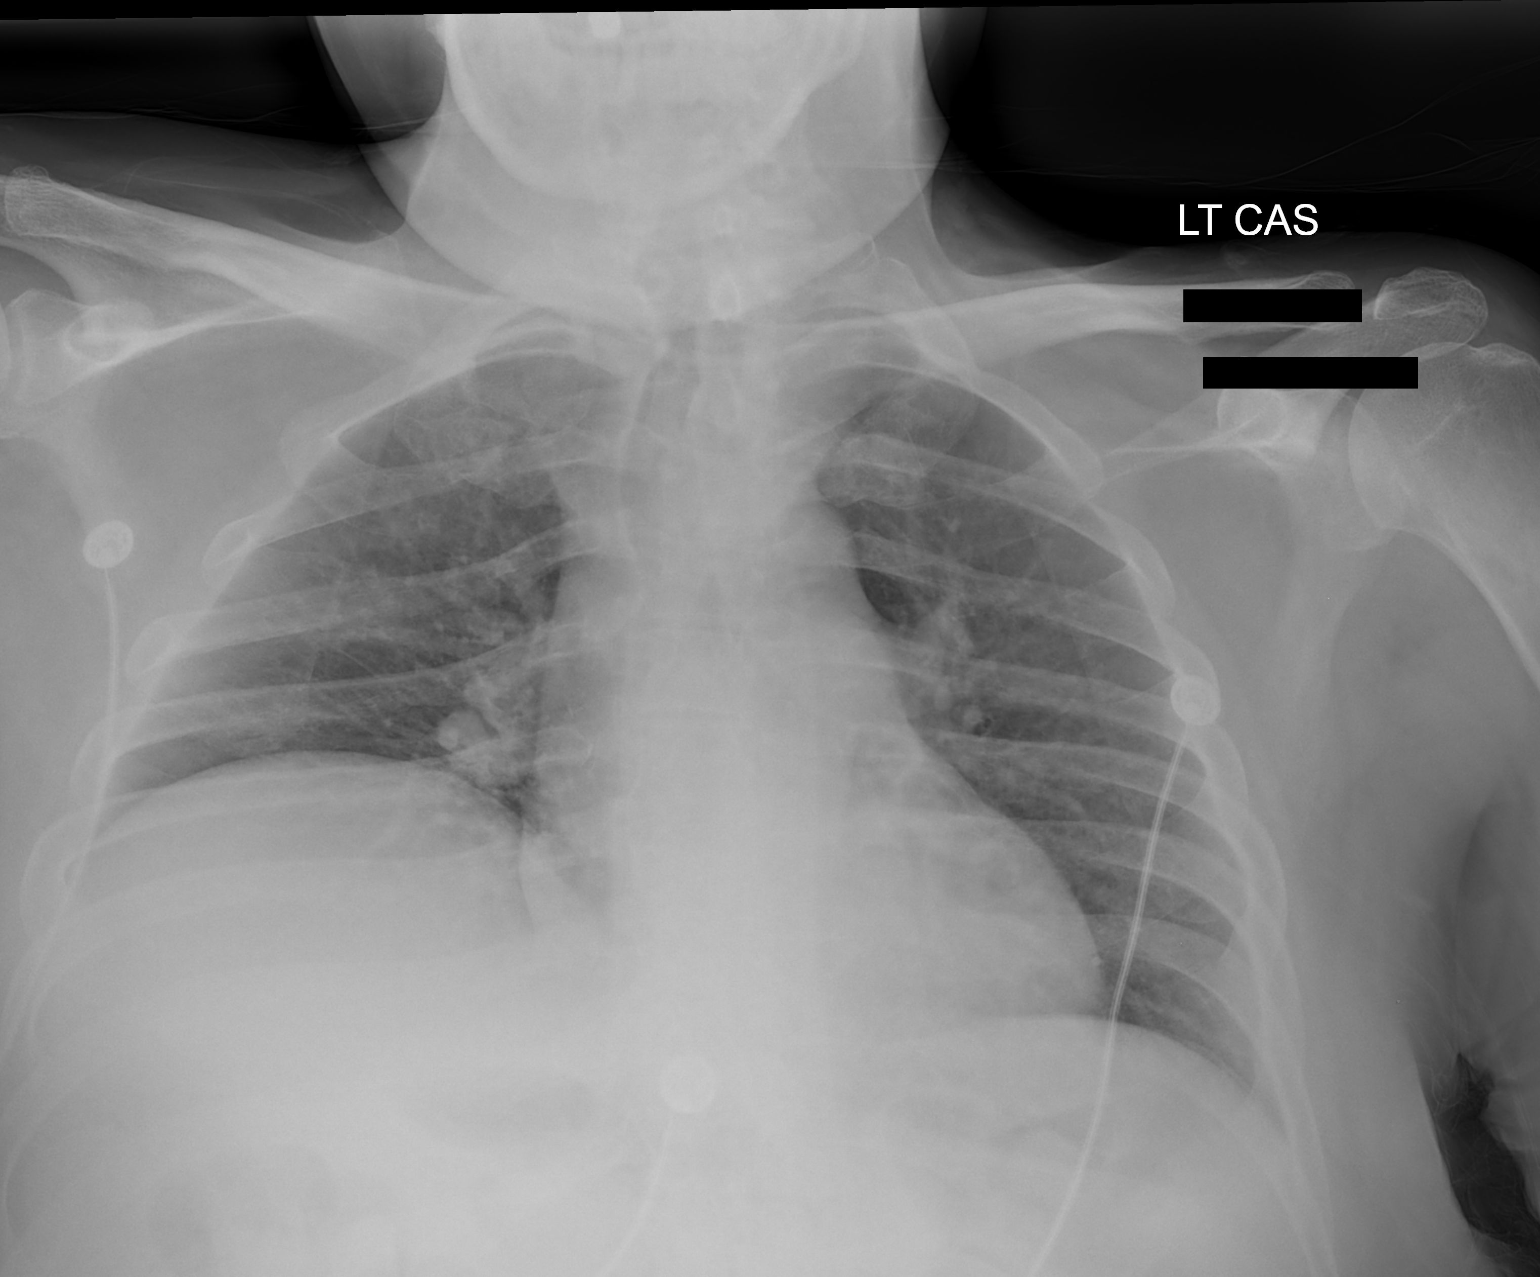

[1 of 1 positions shown; findings below may reference images not displayed]

FINDINGS: 9406 hours.  Low volume film.  Stable asymmetric
elevation of the right hemidiaphragm.  Minimal atelectasis seen at
the right lung base.  Otherwise lungs are clear. Cardiopericardial
silhouette is at upper limits of normal for size. Telemetry leads
overlie the chest.
IMPRESSION: Stable.  Minimal right base atelectasis without other acute
cardiopulmonary findings.

## 2010-07-21 IMAGING — CT CT CERVICAL SPINE W/O CM
3 of 4 series · 11 of 20 positions shown, 12 images · non-contrast
Comparison: Previous MR and CT examinations, the most recent dated
03/06/2010.

CT HEAD

CLINICAL DATA: Posterior head abrasions, nausea and vomiting
following a fall this morning.  History of anoxic brain damage,
adrenal leukodystrophy and encephalopathy.

CT HEAD WITHOUT CONTRAST
CT CERVICAL SPINE WITHOUT CONTRAST
TECHNIQUE: Multidetector CT imaging of the head and cervical spine
was performed following the standard protocol without intravenous
contrast.  Multiplanar CT image reconstructions of the cervical
spine were also generated.

[Series 5: c_spine 2.0 b31s · axial · 0.29mm/px · z∈[-278,-188]mm · 4 of 77 slices shown, 5 images]
[im 16/77  soft-tissue]
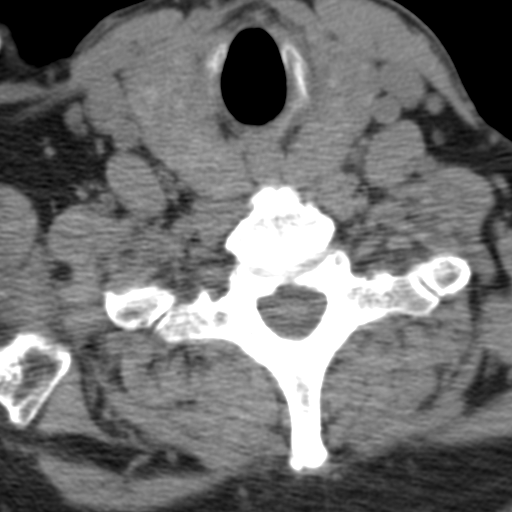
[im 16/77  bone]
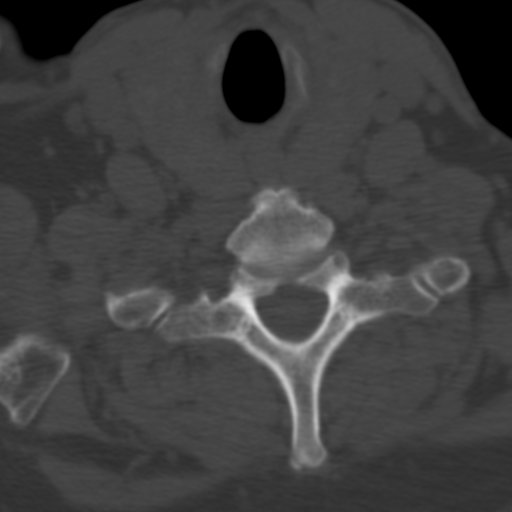
[im 31/77  bone]
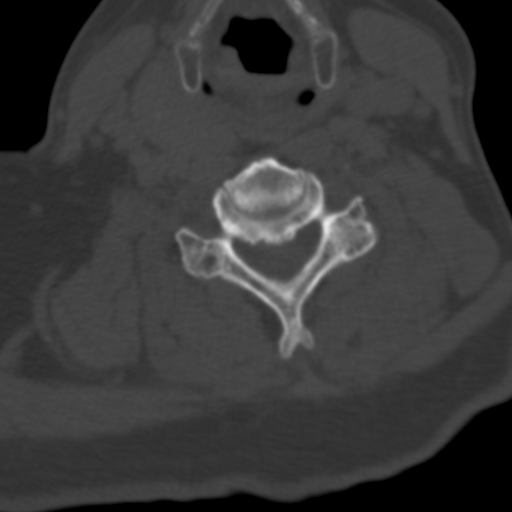
[im 46/77  bone]
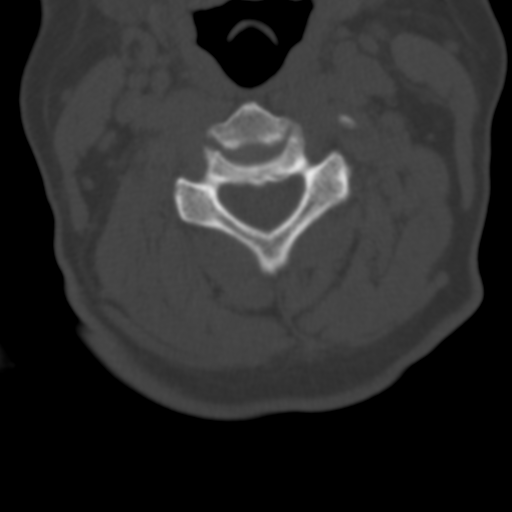
[im 61/77  bone]
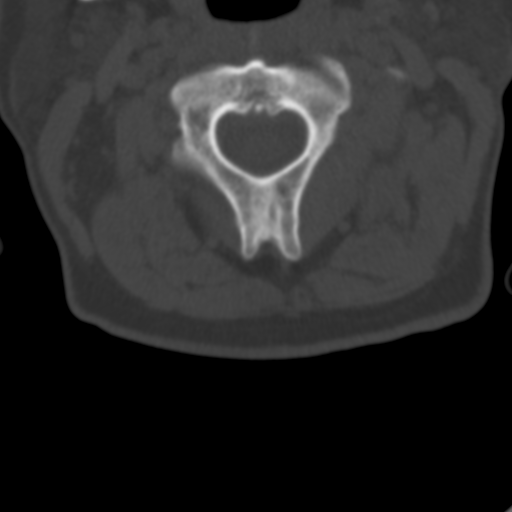

[Series 602: <mpr thick range> · axial · 0.30mm/px · z∈[-297,-210]mm · 4 of 78 slices shown]
[im 16/78  bone]
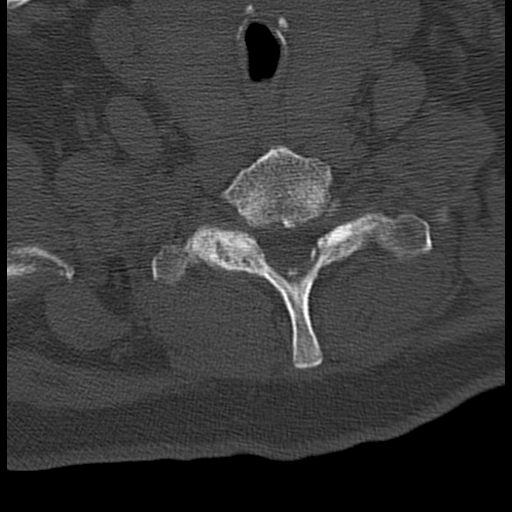
[im 31/78  bone]
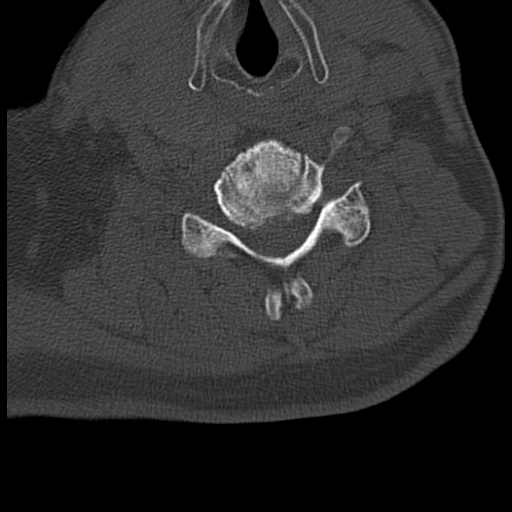
[im 47/78  bone]
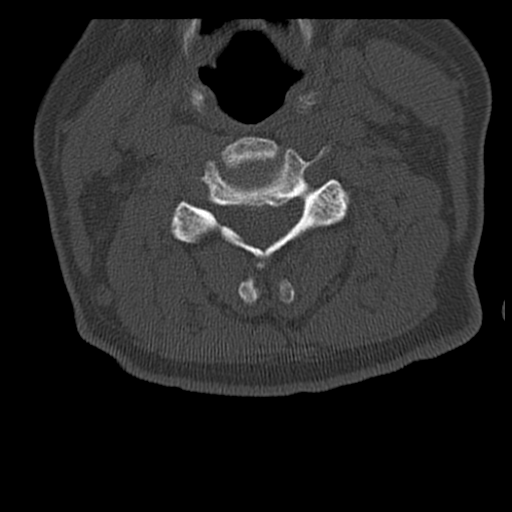
[im 62/78  bone]
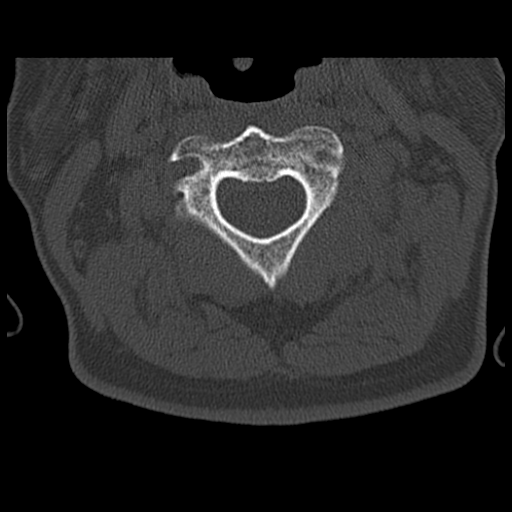

[Series 603: <mpr thick range(1)> · coronal · 0.30mm/px · 3 of 41 slices shown]
[im 9/41  bone]
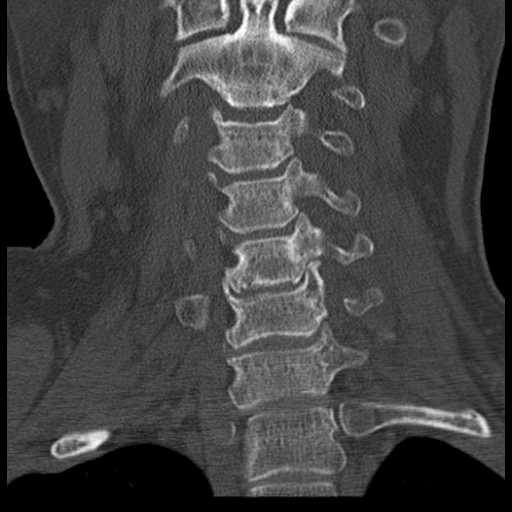
[im 17/41  bone]
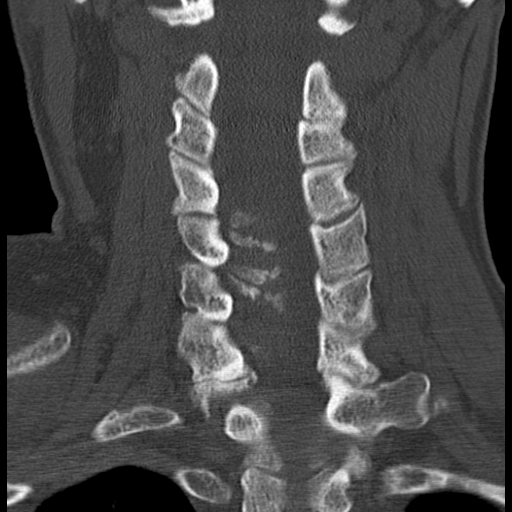
[im 25/41  bone]
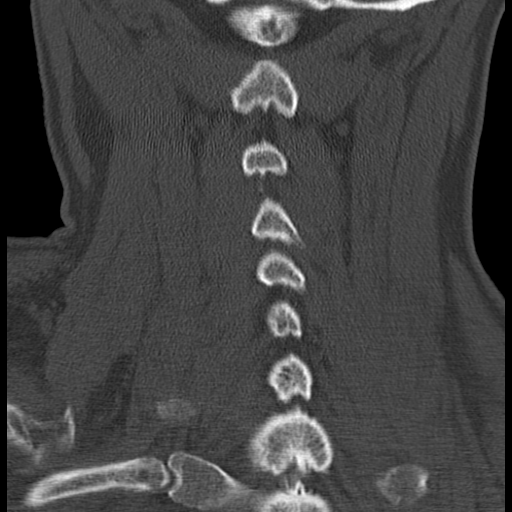

[11 of 20 positions shown; findings below may reference images not displayed]

FINDINGS: Stable mildly enlarged ventricles and subarachnoid
spaces.  No significant change in a small amount of bifrontal
periventricular low density.  No skull fracture, intracranial
hemorrhage or paranasal sinus air-fluid levels.  Small right
maxillary sinus retention cyst.
IMPRESSION: Stable mild atrophy and mild bifrontal white matter gliosis.  No
acute abnormality.

CT CERVICAL SPINE
FINDINGS: Mild reversal of the normal cervical lordosis.
Multilevel degenerative changes.  No prevertebral soft tissue
swelling, fractures or subluxations.  Diffusely enlarged and
inhomogeneous thyroid gland with small bilateral nodules.
IMPRESSION: 1.  No fracture or subluxation.
2.  Mild reversal of the normal cervical lordosis.
3.  Multilevel degenerative changes.
4.  Multinodular thyroid goiter.

## 2010-07-22 IMAGING — CR DG CHEST 1V PORT
1 series · 1 of 1 positions shown · non-contrast
Comparison: 06/15/2010

CLINICAL DATA: Pneumonia, central line insertion

PORTABLE CHEST - 1 VIEW

[series [date]]
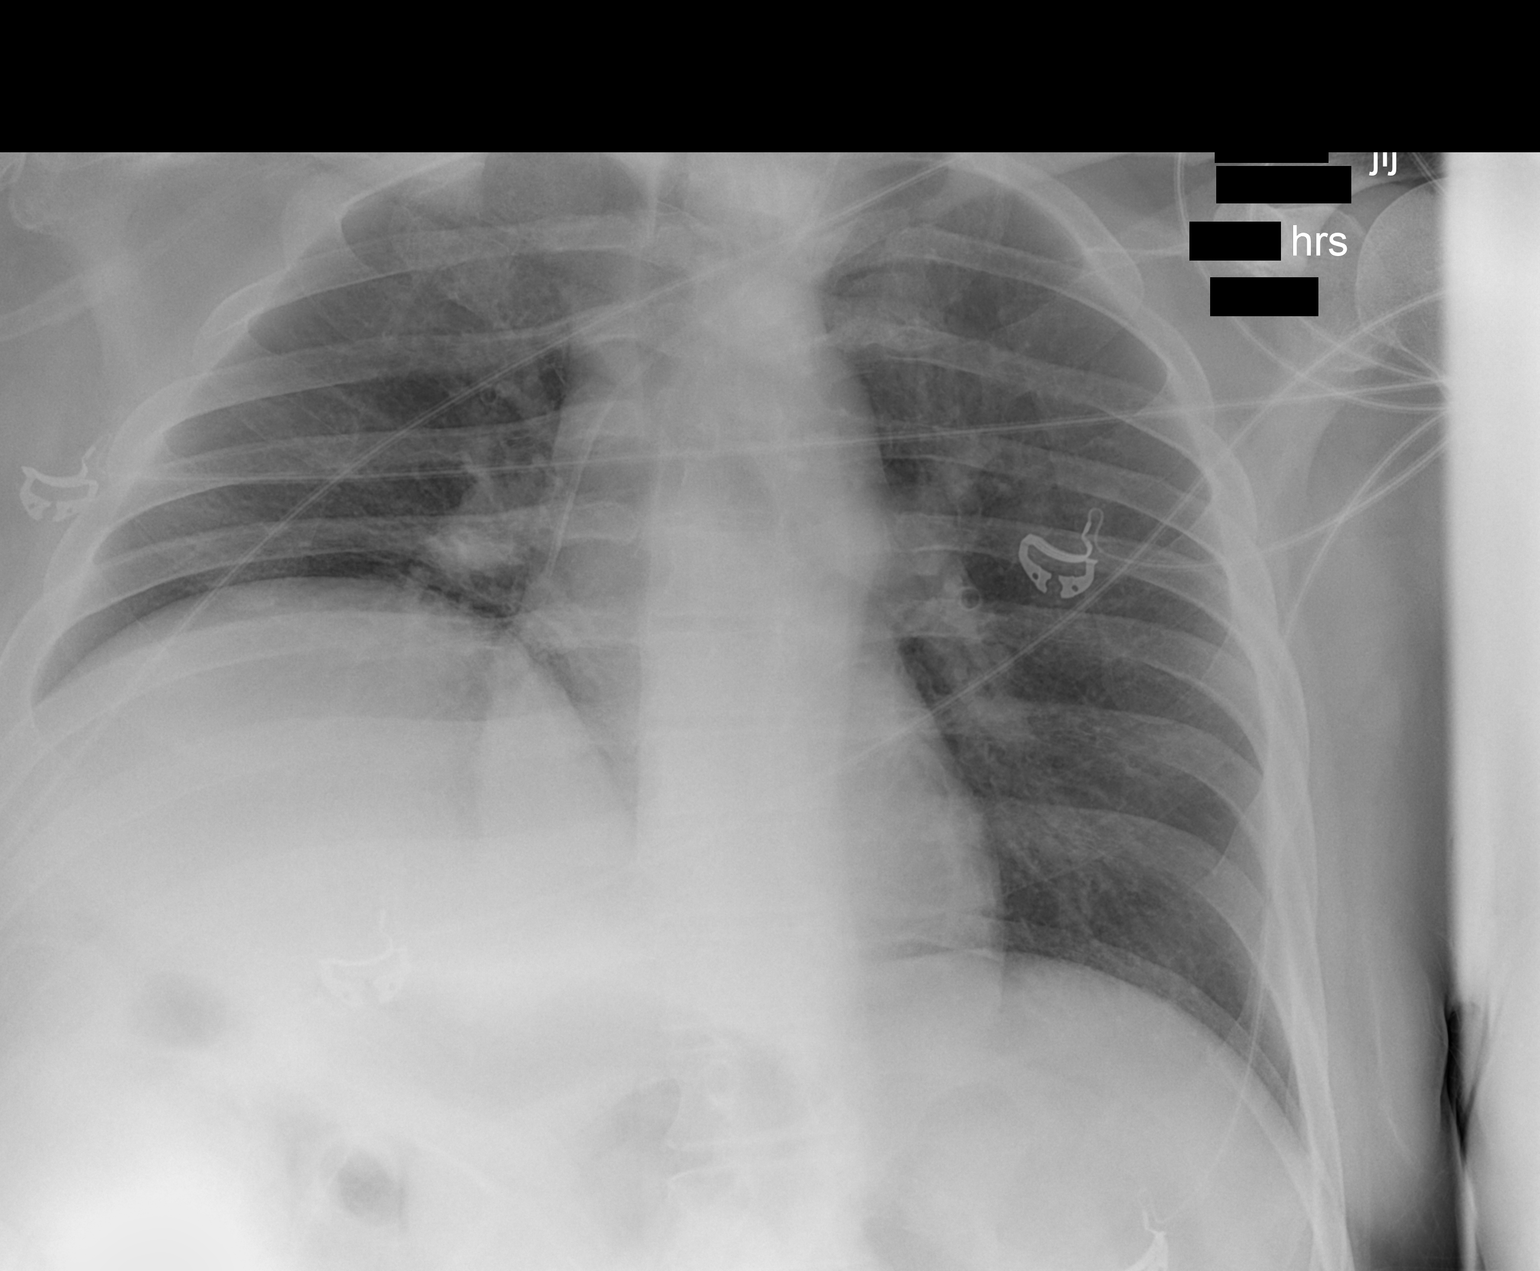

[1 of 1 positions shown; findings below may reference images not displayed]

FINDINGS: New left IJ central line tip in the distal SVC.  No
pneumothorax identified.  Right hemidiaphragm remains elevated.
Stable heart size and vascularity.  No new consolidation, edema, or
effusion.
IMPRESSION: New left IJ central line tip SVC.  Stable chest exam.

## 2010-07-23 IMAGING — CR DG CHEST 1V PORT
1 series · 1 of 1 positions shown · non-contrast
Comparison: Portable chest x-rays yesterday, 06/15/2010, and
03/04/2010.

CLINICAL DATA: Shortness of breath.  Follow up right basilar
atelectasis.

PORTABLE CHEST - 1 VIEW [DATE]/[id number] hours:

[series 1]
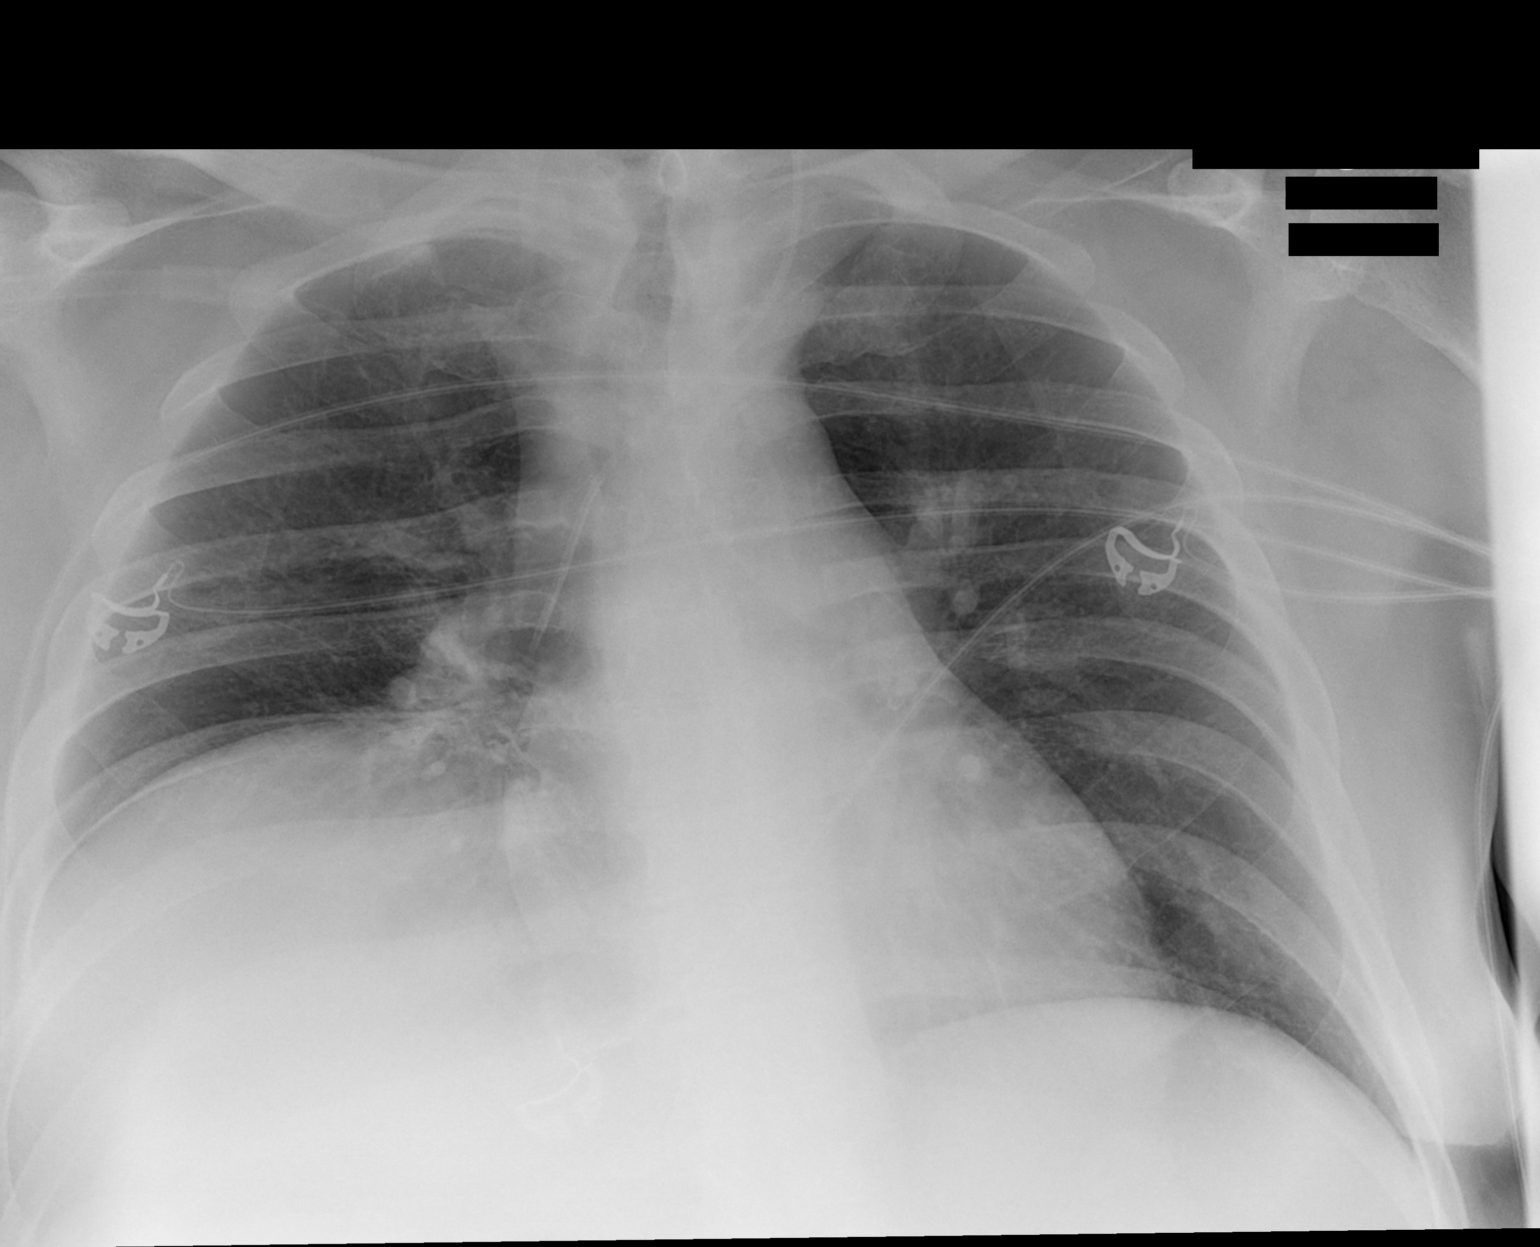

[1 of 1 positions shown; findings below may reference images not displayed]

FINDINGS: Heart size upper normal and stable for the AP portable
technique.  Stable chronic elevation of the right hemidiaphragm.
Mild linear atelectasis at the right lower lung bases, unchanged
from the examination yesterday.  Lungs remain clear otherwise.  No
visible pleural effusions.  Left jugular central venous catheter
tip in the SVC.
IMPRESSION: Stable mild right basilar atelectasis and chronic elevation of the
right hemidiaphragm.  No new abnormalities.

## 2010-07-25 IMAGING — CR DG CHEST 1V PORT
1 series · 1 of 1 positions shown · non-contrast
Comparison: 06/21/2010

CLINICAL DATA: Pneumonia, follow-up

PORTABLE CHEST - 1 VIEW

[series [date]]
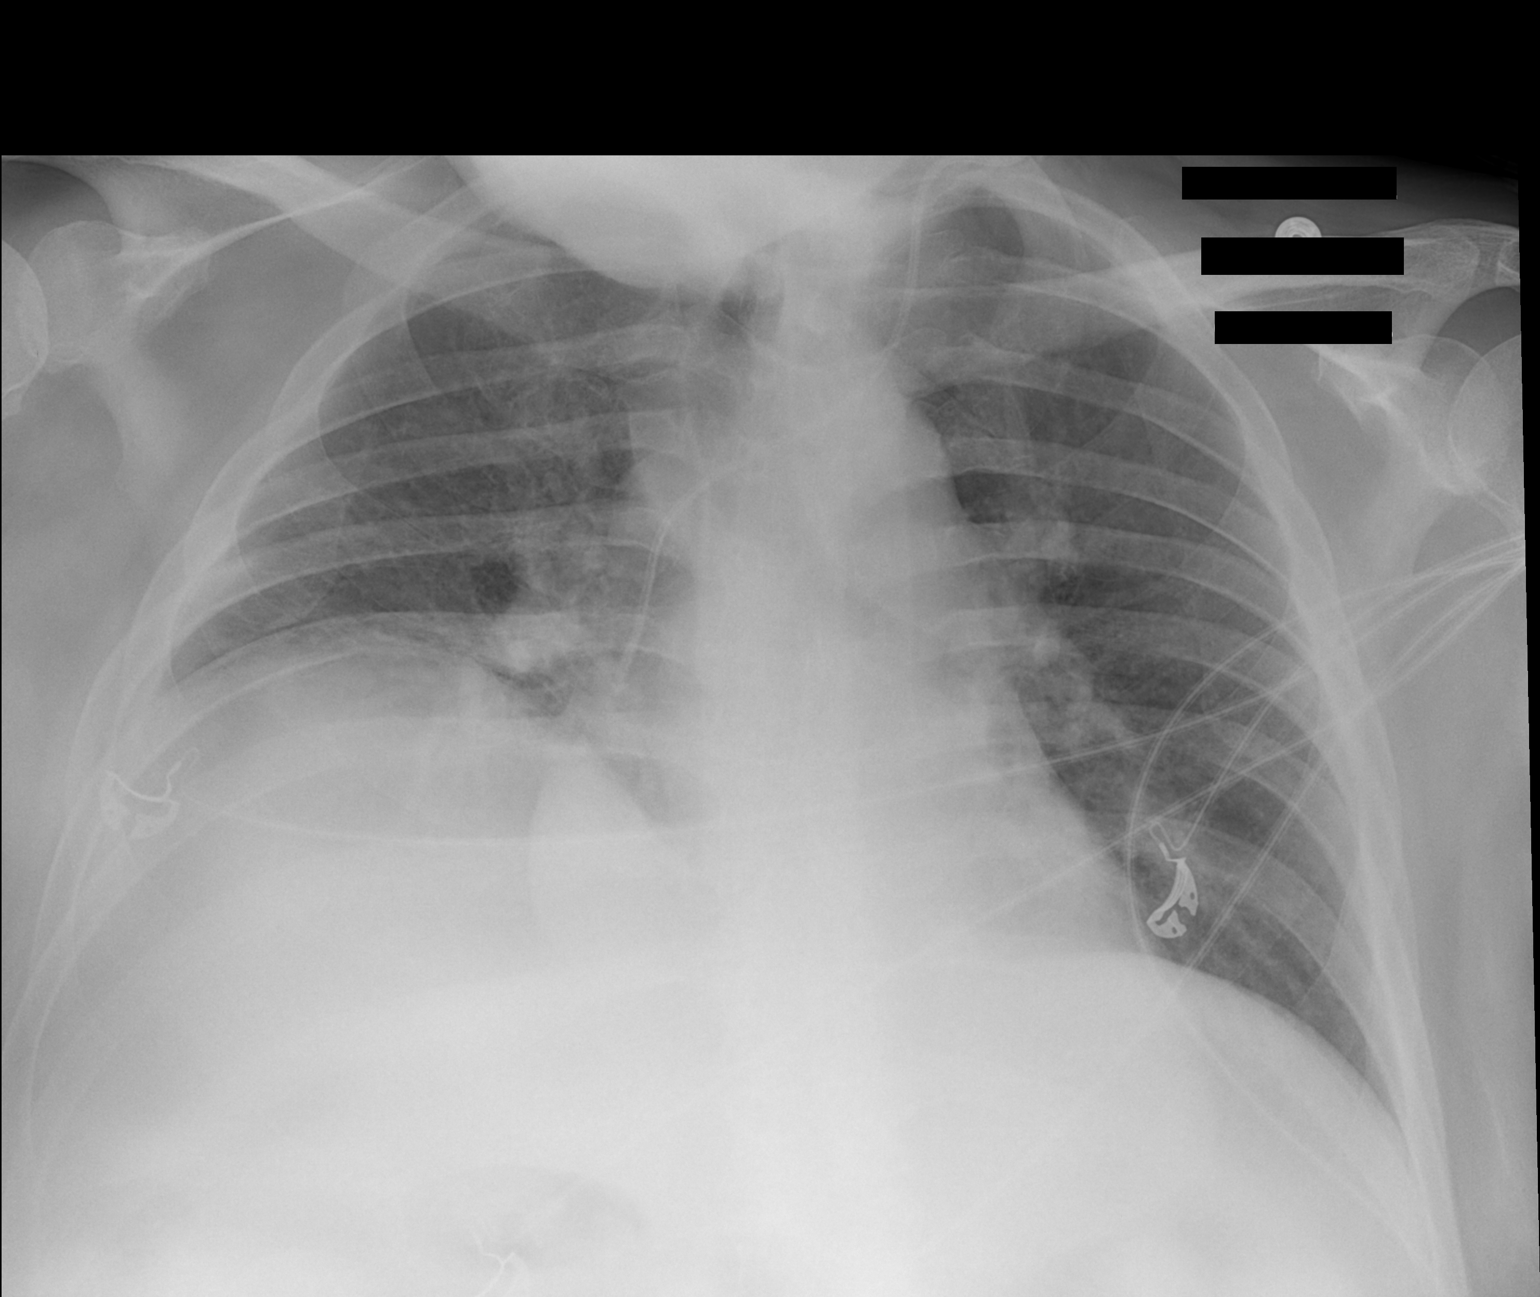

[1 of 1 positions shown; findings below may reference images not displayed]

FINDINGS: Left IJ central line tip in the SVC.  Markedly decreased
lung volumes noted with basilar atelectasis, worse on the right.
Right hemidiaphragm remains elevated.  Stable heart size and
vascularity.  No large effusion pneumothorax.  No significant
interval change.
IMPRESSION: Stable portable chest exam

## 2010-08-09 ENCOUNTER — Inpatient Hospital Stay (HOSPITAL_COMMUNITY): Admission: EM | Admit: 2010-08-09 | Discharge: 2010-08-14 | Payer: Self-pay | Admitting: Emergency Medicine

## 2010-09-11 IMAGING — CR DG ABDOMEN ACUTE W/ 1V CHEST
4 series · 4 of 4 positions shown · non-contrast
Comparison: CT abdomen pelvis of 06/18/2010 and chest with acute
abdomen of 02/12/2010

CLINICAL DATA: Nausea, vomiting, fever

ACUTE ABDOMEN SERIES (ABDOMEN 2 VIEW & CHEST 1 VIEW)

[w abdomen decub (1 of 2)]
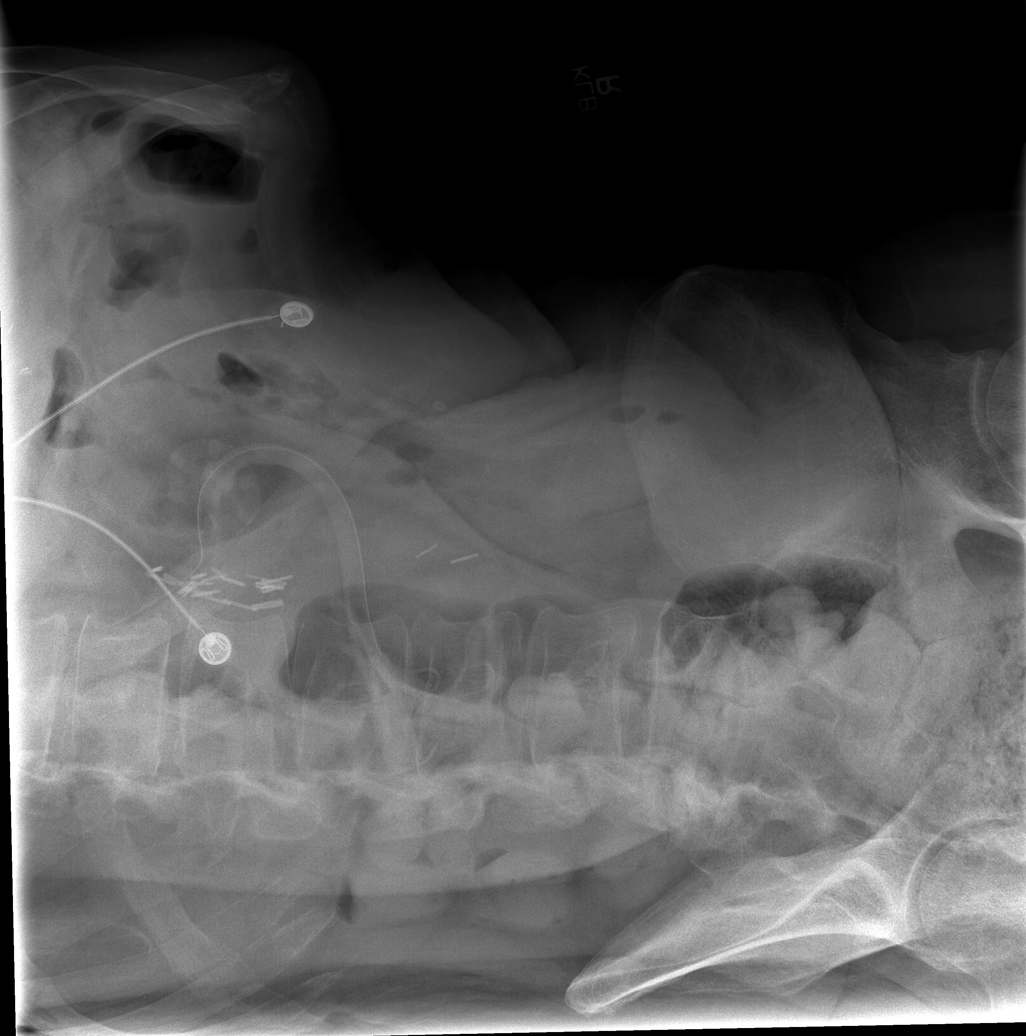

[w abdomen decub (2 of 2)]
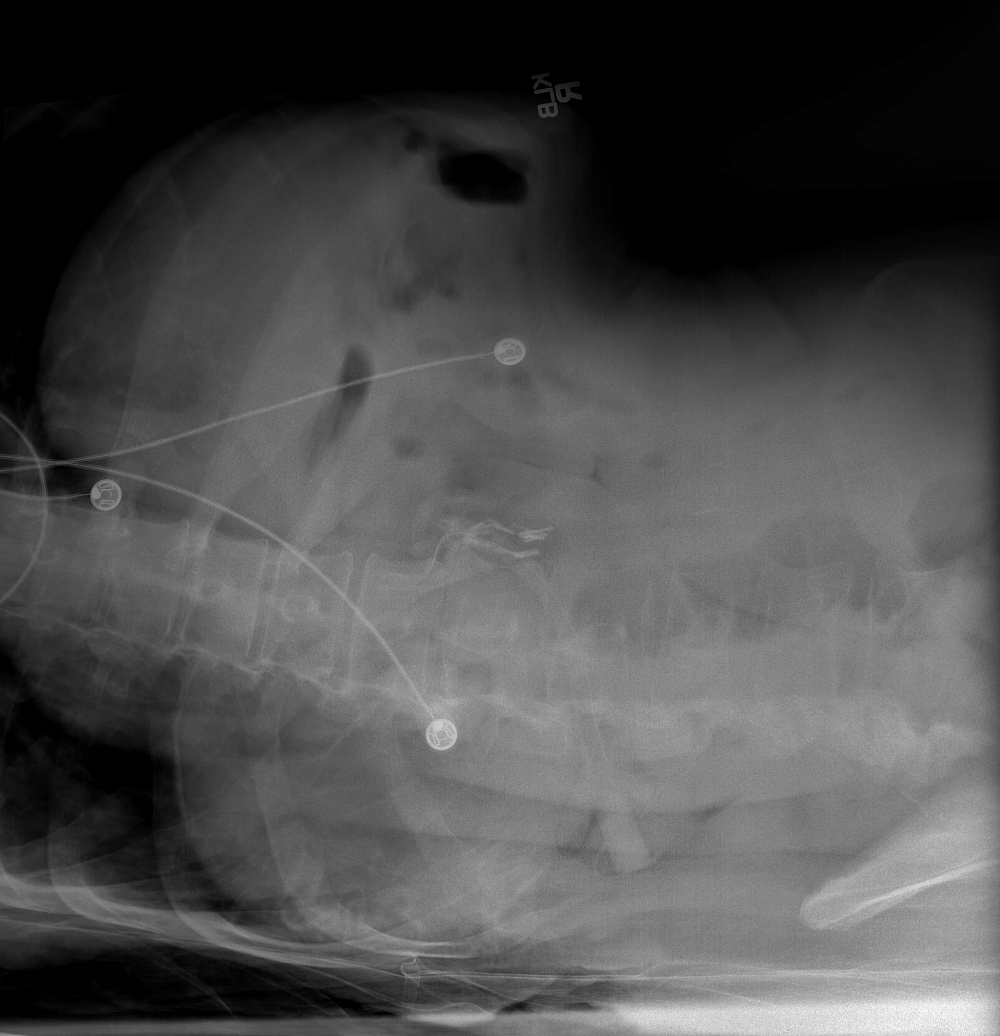

[view not recorded (1 of 2)]
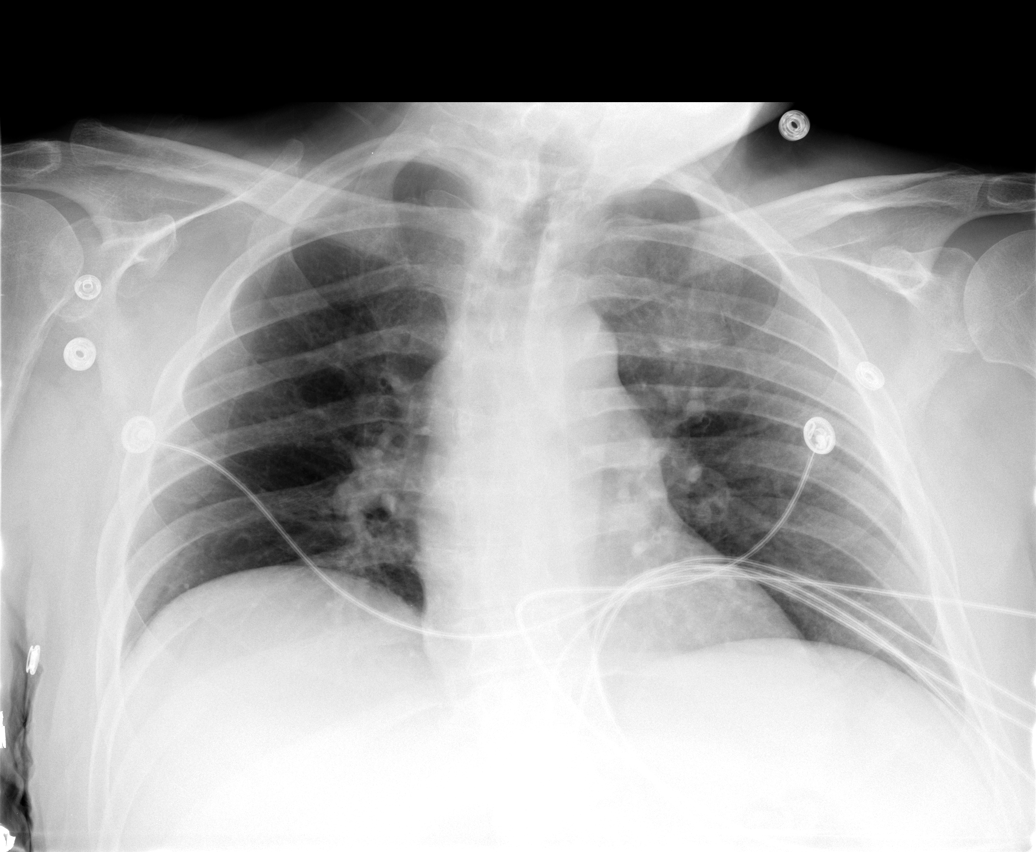

[view not recorded (2 of 2)]
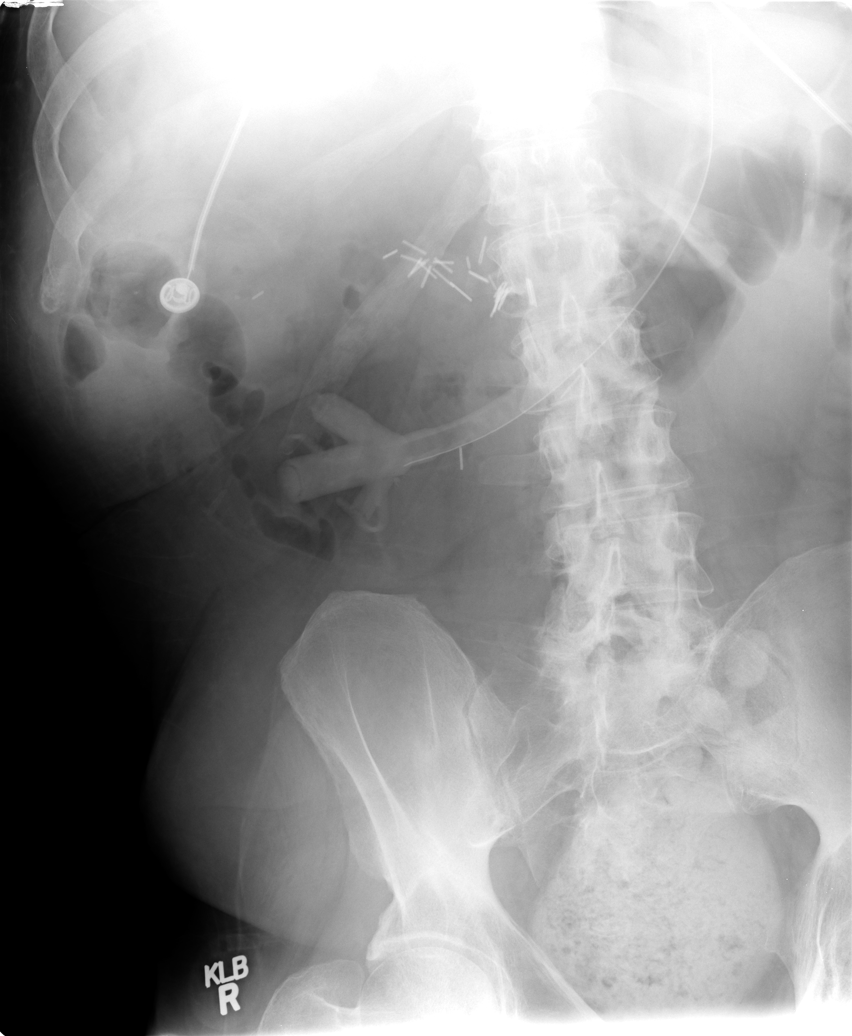

[4 of 4 positions shown; findings below may reference images not displayed]

FINDINGS: The lungs are clear.  Mediastinal contours are stable.
The heart is within normal limits in size.

Supine and left lateral decubitus films of the abdomen show no
bowel obstruction.  No free air is seen.  Probable gastrostomy tube
is noted.
IMPRESSION: 1.  No active lung disease.
2.  No bowel obstruction.  No free air.

## 2010-09-11 IMAGING — CR DG CHEST 1V PORT
1 series · 1 of 1 positions shown · non-contrast
Comparison: 08/09/2010

CLINICAL DATA: Line placement.  Sepsis, fever.

PORTABLE CHEST - 1 VIEW

[series 1]
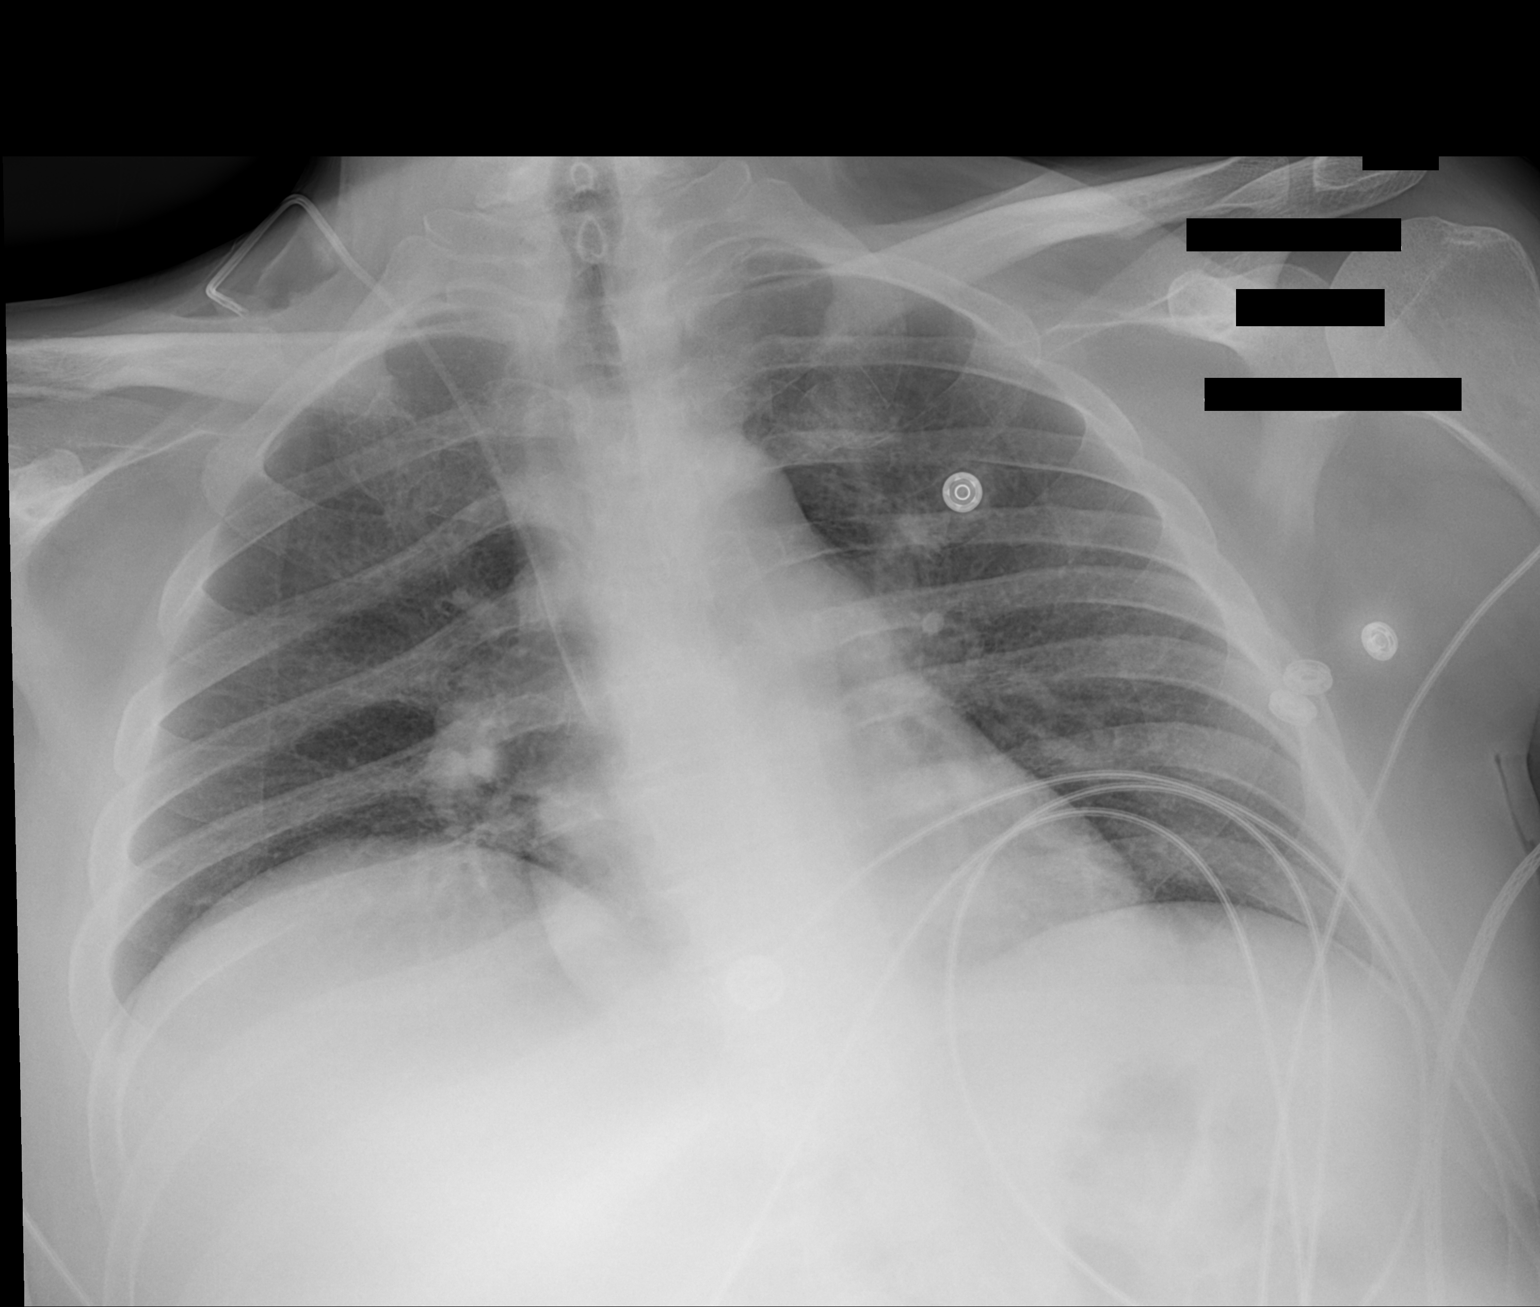

[1 of 1 positions shown; findings below may reference images not displayed]

FINDINGS: The patient has a right IJ central line.  Tip overlies
the level of the superior vena cava.  No evidence for pneumothorax.
Film is made with shallow inflation.  Heart size is normal.  No
focal consolidations.
IMPRESSION: Interval placement of right IJ central line.

## 2010-09-11 IMAGING — CR DG CHEST 1V PORT
1 series · 1 of 1 positions shown · non-contrast
Comparison: Portable chest x-ray of 06/22/2010

CLINICAL DATA: Fever, nausea and vomiting, central line placement

PORTABLE CHEST - 1 VIEW

[series 1]
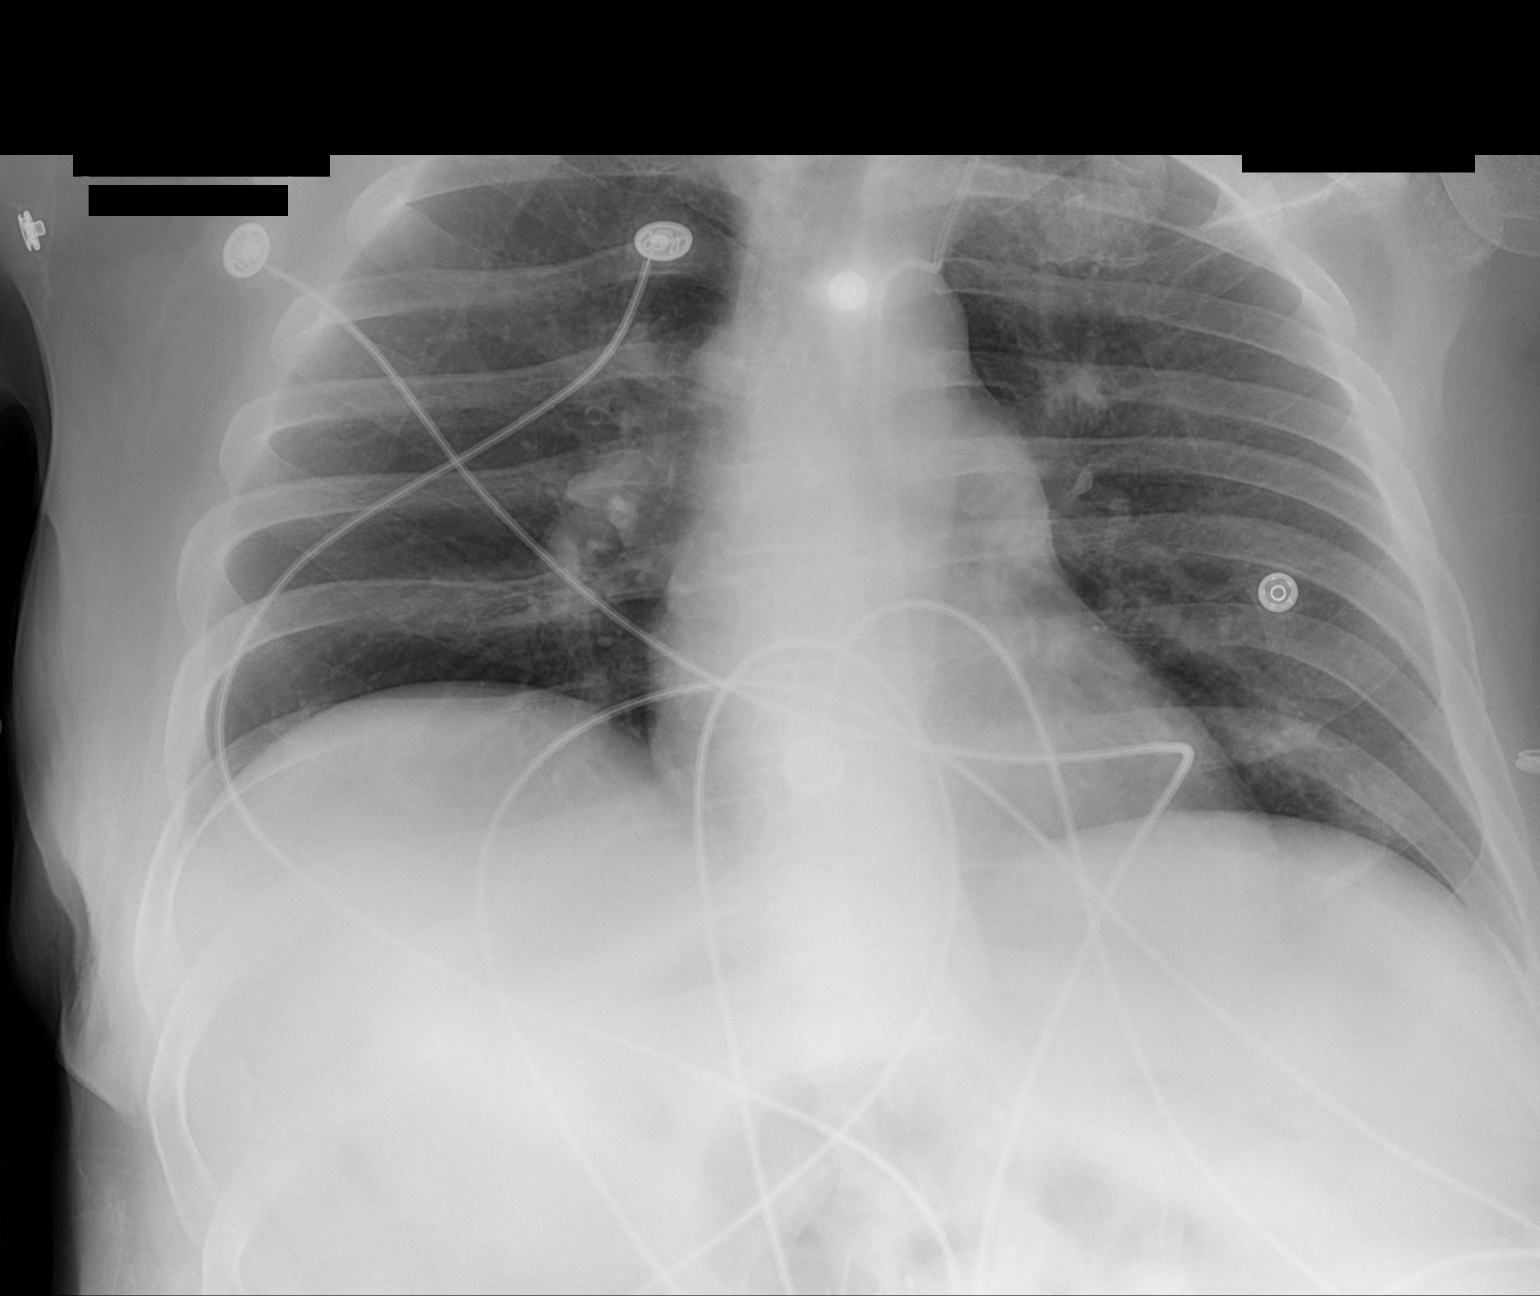

[1 of 1 positions shown; findings below may reference images not displayed]

FINDINGS: The left IJ central venous catheter tip is in the region
of the left innominate vein or in a tributary.  The lungs are
clear.  No pneumothorax is seen.  Mediastinal contours are normal.
The heart is within normal limits in size.
IMPRESSION: The left IJ central venous catheter tip in the region of the left
innominate vein or a tributary.  No active lung disease.

## 2010-09-12 IMAGING — CT CT ABD-PELV W/ CM
2 of 5 series · 16 of 46 positions shown, 18 images · IV contrast (APPLIED)
Comparison: CT abdomen pelvis of 06/18/2010

CLINICAL DATA: Abdominal pain, sepsis, paraplegic

CT ABDOMEN AND PELVIS WITH CONTRAST
TECHNIQUE: Multidetector CT imaging of the abdomen and pelvis was
performed following the standard protocol during bolus
administration of intravenous contrast.
Contrast: 125 ml 4mnipaque-WRR

[Series 2: abd_pel 5.0 b40f st · axial · 0.93mm/px · z∈[-510,-4]mm · 13 of 113 slices shown, 15 images]
[im 6/113  soft-tissue]
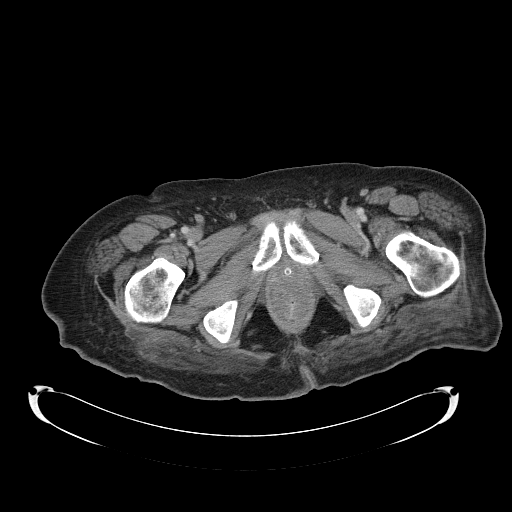
[im 6/113  bone]
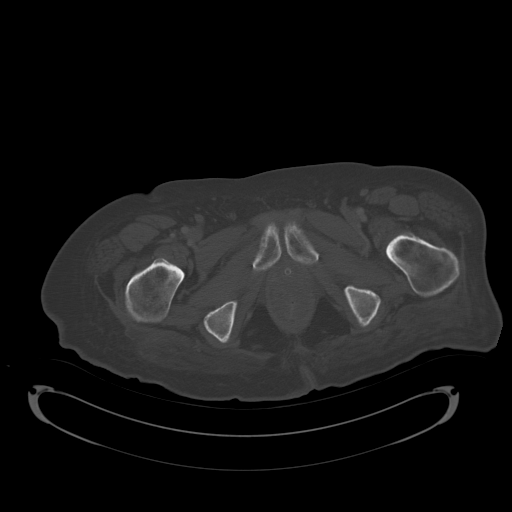
[im 18/113  soft-tissue]
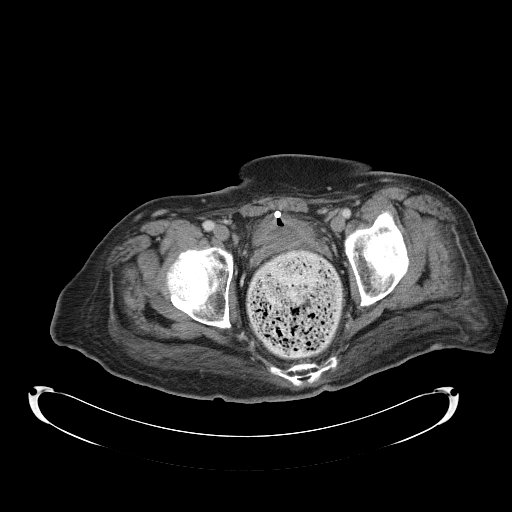
[im 24/113  soft-tissue]
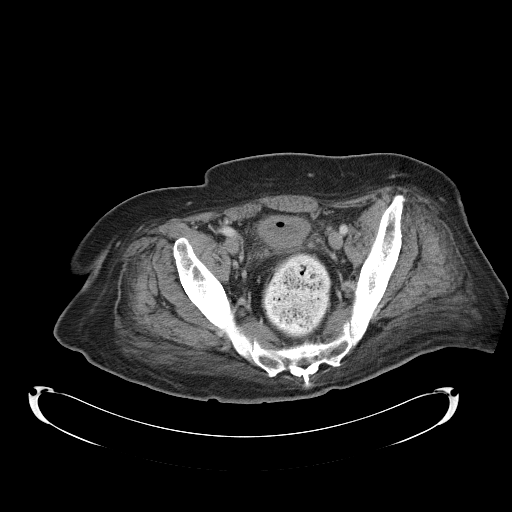
[im 30/113  soft-tissue]
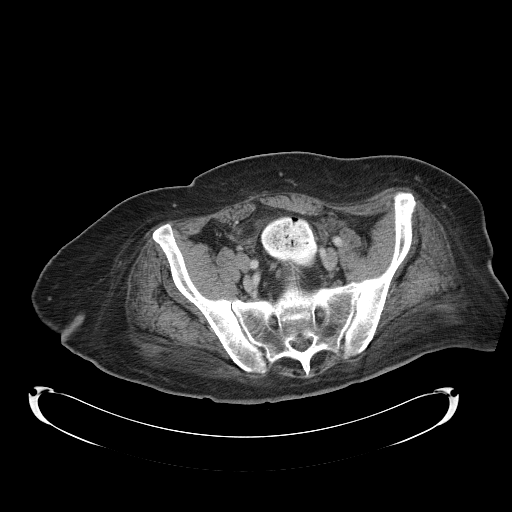
[im 42/113  soft-tissue]
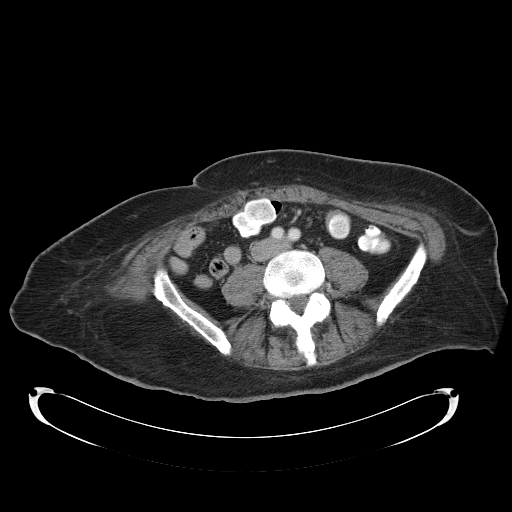
[im 48/113  soft-tissue]
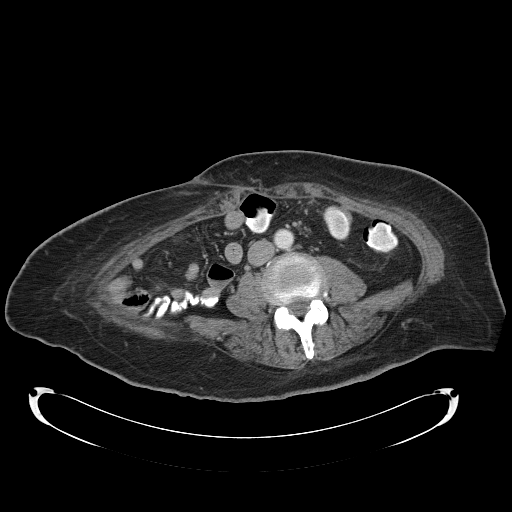
[im 59/113  soft-tissue]
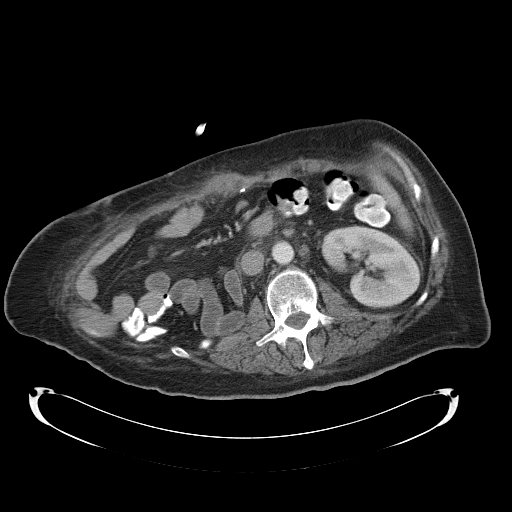
[im 65/113  soft-tissue]
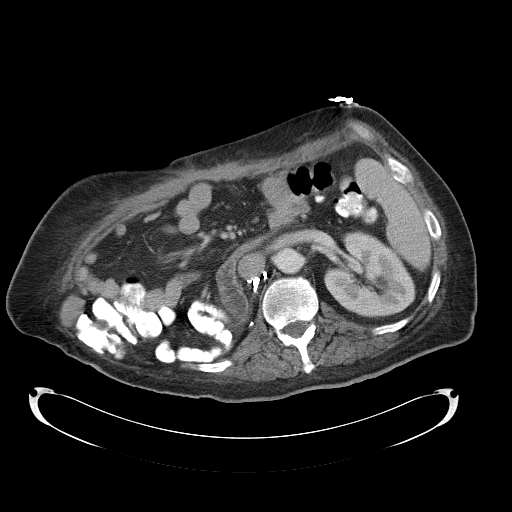
[im 71/113  soft-tissue]
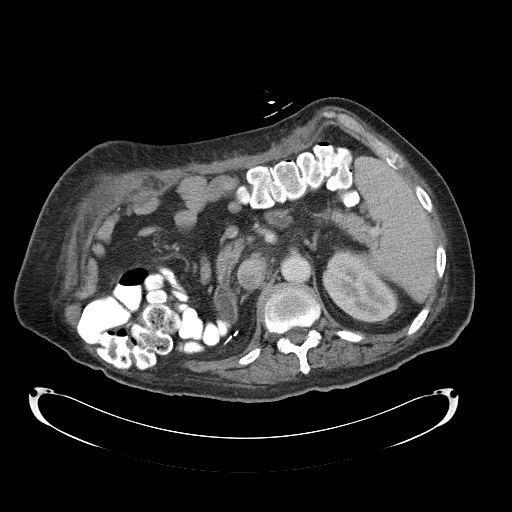
[im 71/113  bone]
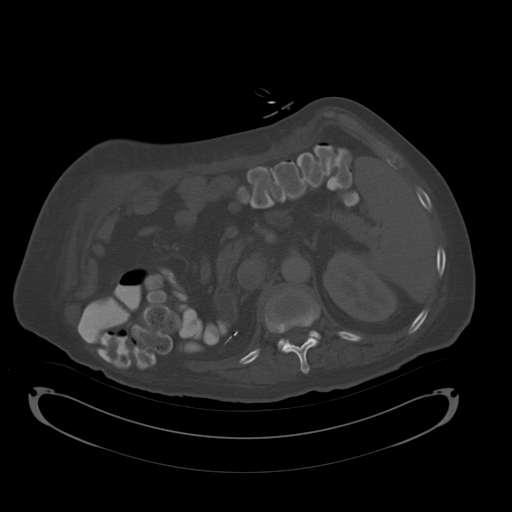
[im 83/113  soft-tissue]
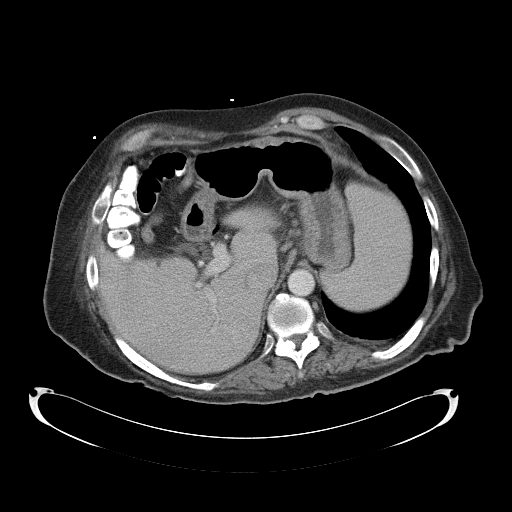
[im 89/113  soft-tissue]
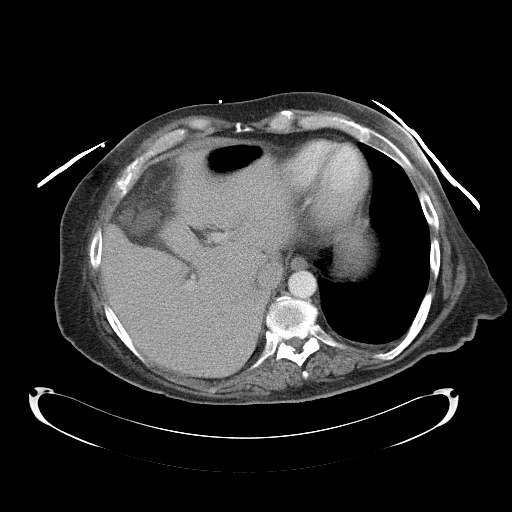
[im 95/113  soft-tissue]
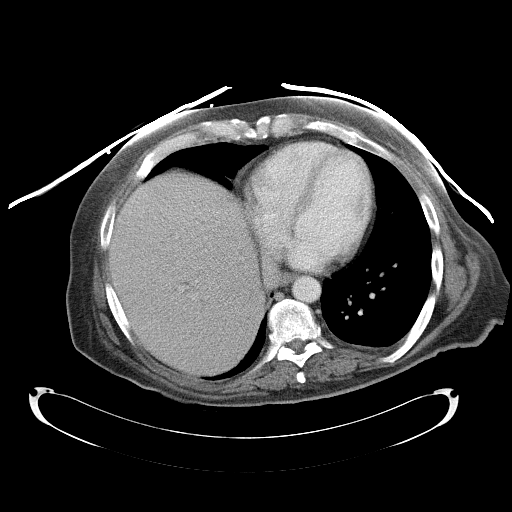
[im 107/113  soft-tissue]
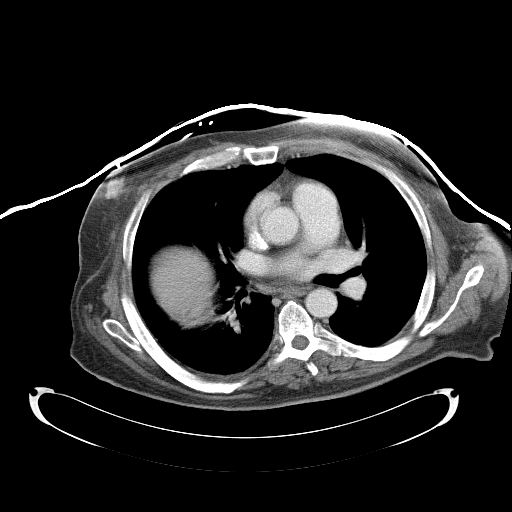

[Series 602: coronal abdomen · coronal · 1.13mm/px · 3 of 130 slices shown]
[im 44/130  soft-tissue]
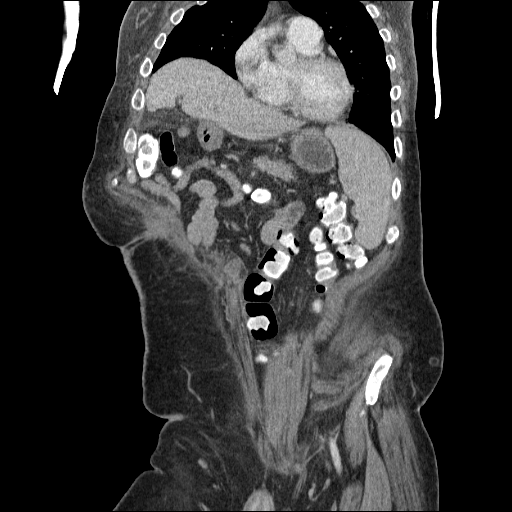
[im 58/130  soft-tissue]
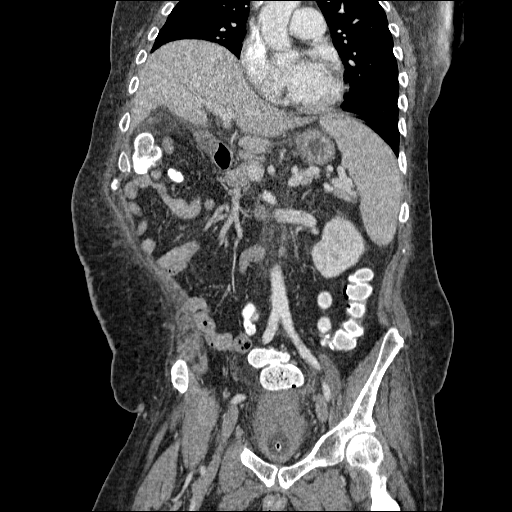
[im 72/130  soft-tissue]
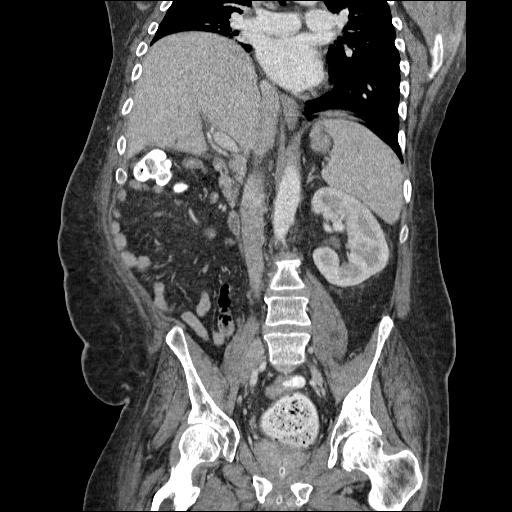

[16 of 46 positions shown; findings below may reference images not displayed]

FINDINGS: The lung bases are clear.  The heart is within normal
limits in size.  The liver enhances with no focal abnormality and
no ductal dilatation is seen.  Surgical clips are present from
prior cholecystectomy.  However near the surgical gallbladder bed,
there is low attenuation fluid present which appears new compared
to the CT from [DATE].  This may represent a small amount of blood
or fluid from recent percutaneous gastrostomy tube insertion.  The
pancreas is normal in size and the pancreatic duct is not dilated.
The adrenal glands are unremarkable with the spleen remains
minimally prominent.  The left kidney is compensatorily
hypertrophied, and the right kidney has previously been resected.
There is laxity of the right posterior flank musculature with some
bulging at the site of prior right nephrectomy.  The abdominal
aorta is normal in caliber.

Some strandiness is noted in the midline near the umbilicus
presumably postoperative in nature and stable.  The urinary bladder
is decompressed with Foley catheter.  No free fluid is seen within
the pelvis.  There is fecal impaction within the rectum.  Pars
defects are noted at the L5 level with degenerative disc disease at
L5-S1 and slight anterolisthesis of L5 on S1 by 8 mm.
IMPRESSION: 1.
1.  Small amount of fluid in the right upper quadrant near the
gallbladder bed probably as a result of recent percutaneous
gastrostomy tube insertion.  No definite abscess.
2.  Compensatory hypertrophy of the left kidney.
3.  Laxity of the musculature of the posterior right flank from
prior right nephrectomy.
3.  Fecal impaction within the rectum.
4.  Pars defects at L5.

## 2010-09-17 ENCOUNTER — Emergency Department (HOSPITAL_COMMUNITY): Admission: EM | Admit: 2010-09-17 | Discharge: 2010-09-17 | Payer: Self-pay | Admitting: Emergency Medicine

## 2010-10-13 ENCOUNTER — Encounter: Payer: Self-pay | Admitting: Orthopedic Surgery

## 2010-10-14 ENCOUNTER — Emergency Department (HOSPITAL_COMMUNITY): Admission: EM | Admit: 2010-10-14 | Discharge: 2010-10-14 | Payer: Self-pay | Admitting: Emergency Medicine

## 2010-10-20 IMAGING — CR DG ABDOMEN 1V
2 series · 2 of 2 positions shown · non-contrast
Comparison: CT 08/10/2010

CLINICAL DATA: Check for G-tube placement

ABDOMEN - 1 VIEW

[t abdomen supine]
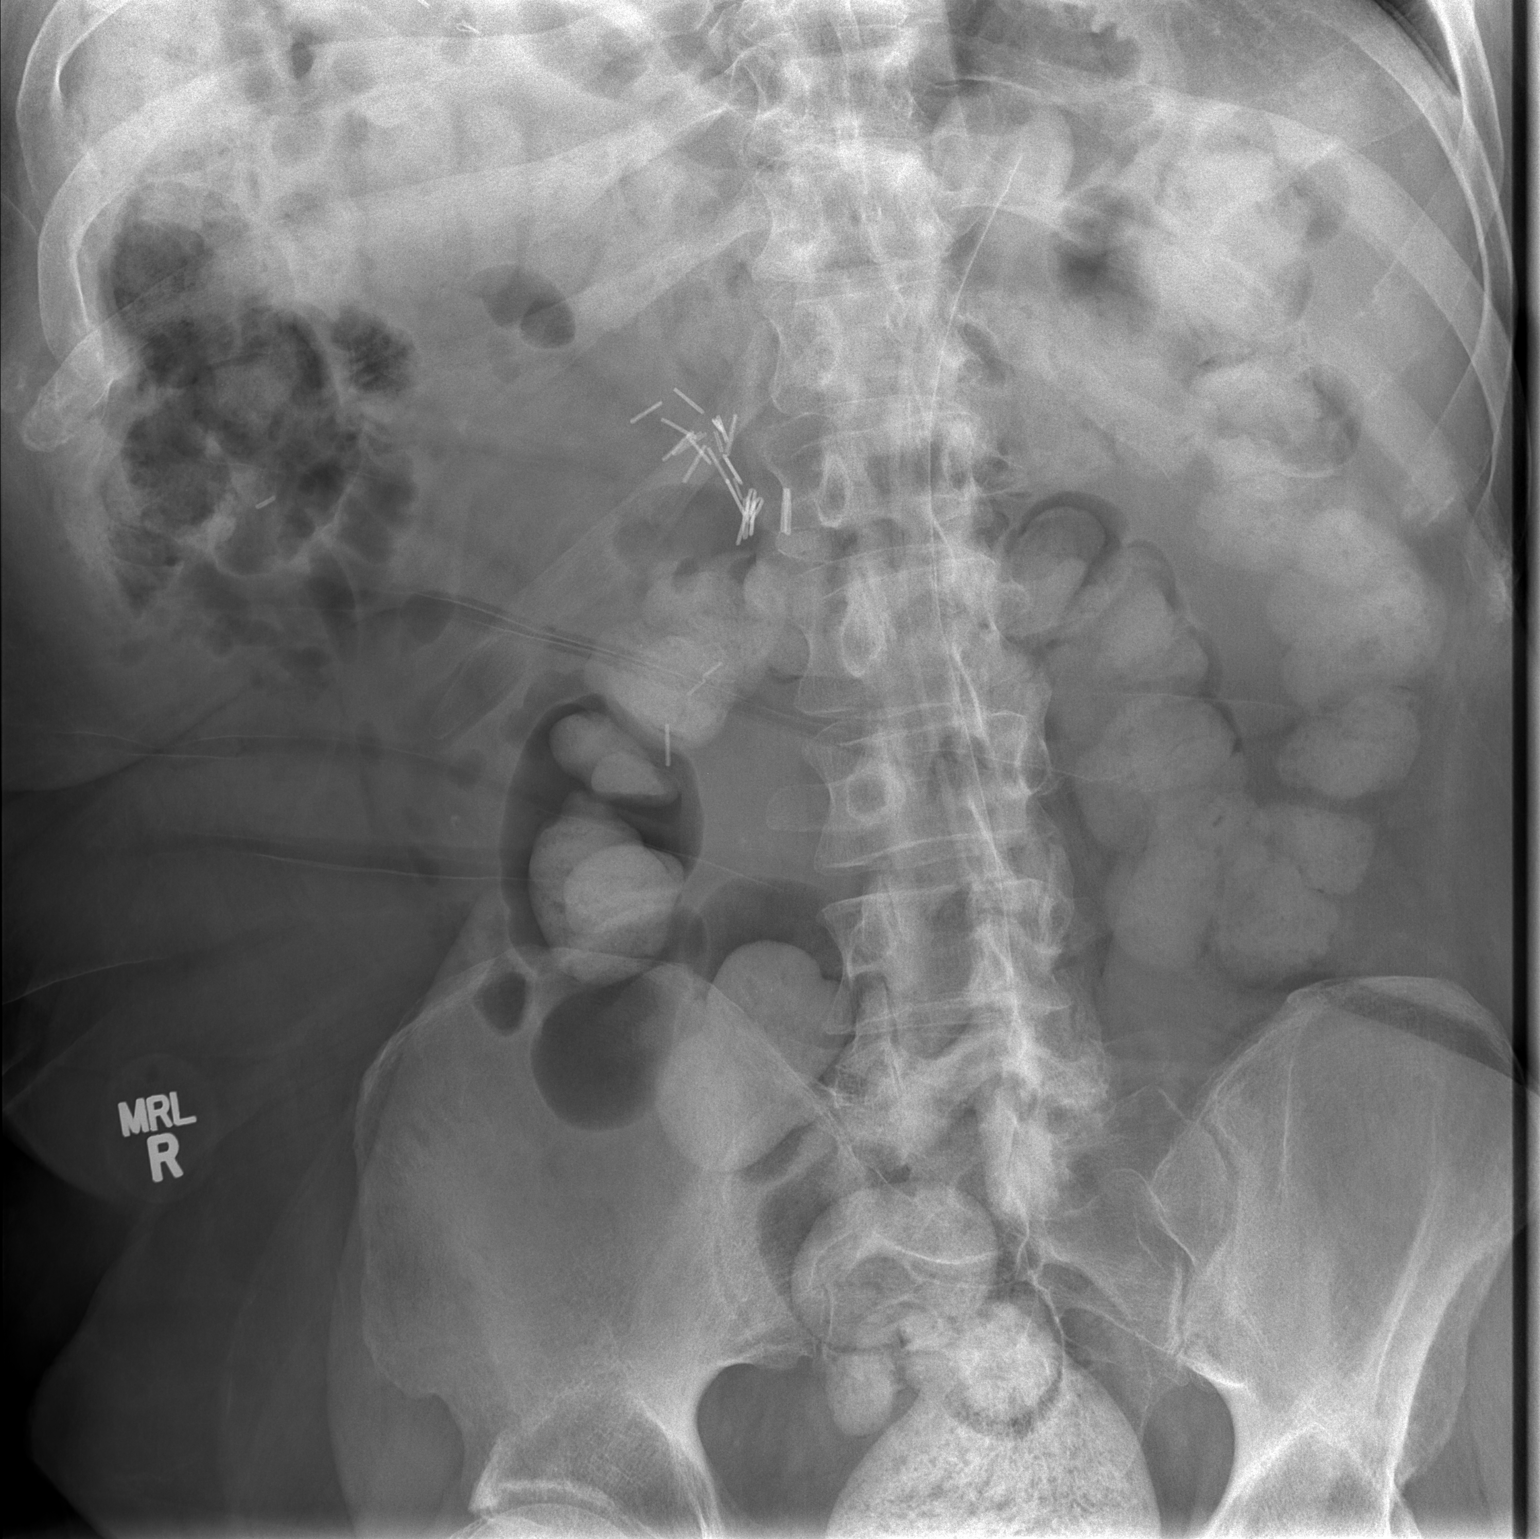

[t abdomen supine *]
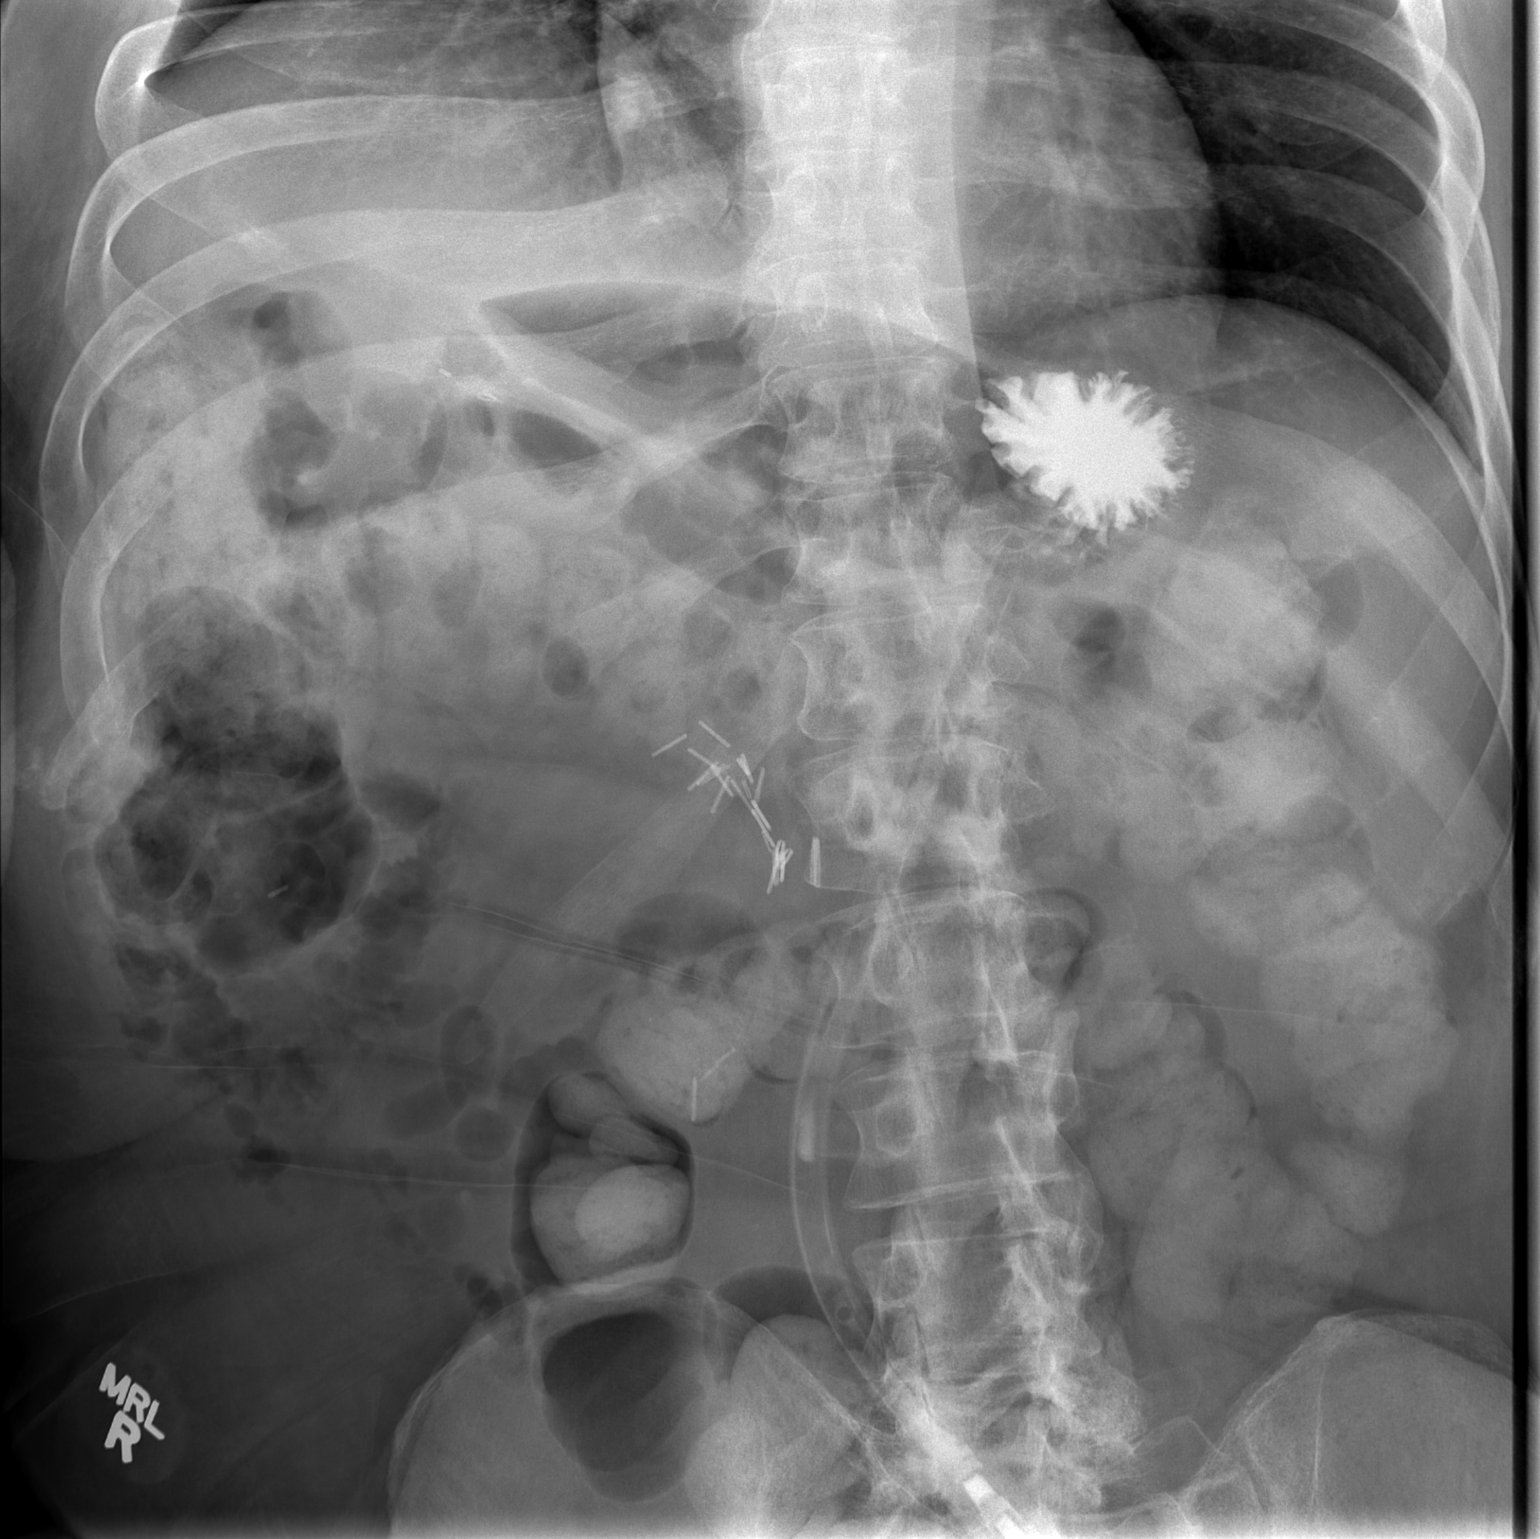

[2 of 2 positions shown; findings below may reference images not displayed]

FINDINGS: 20 ml of water soluble contrast was hand injected through
the percutaneous gastrostomy tube.  Contrast collects within the
gastric fundus confirming location of the tip within the stomach.
IMPRESSION: Percutaneous gastrostomy tube positioned within the stomach.

## 2010-10-27 ENCOUNTER — Encounter: Payer: Self-pay | Admitting: Orthopedic Surgery

## 2010-11-16 IMAGING — CT CT HEAD W/O CM
1 series · 16 of 30 positions shown, 20 images · non-contrast
Comparison: 06/18/2010

CLINICAL DATA: Fall, nose pain.

CT HEAD WITHOUT CONTRAST
TECHNIQUE: Contiguous axial images were obtained from the base of
the skull through the vertex without contrast.

[Series 2: head_seq 4.5 h37s st · axial · 0.50mm/px · z∈[+1218,+1362]mm · 16 of 36 slices shown, 20 images]
[im 2/36  brain]
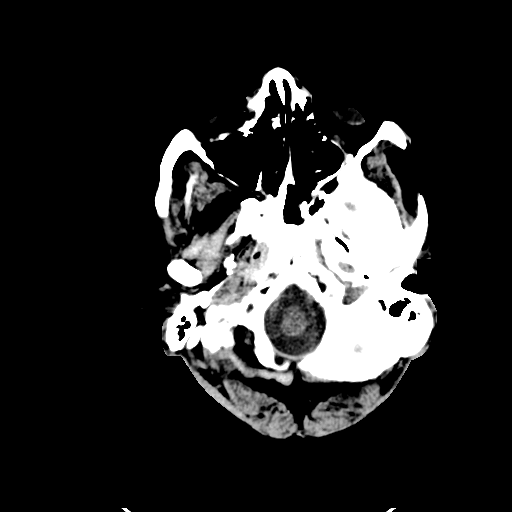
[im 2/36  bone]
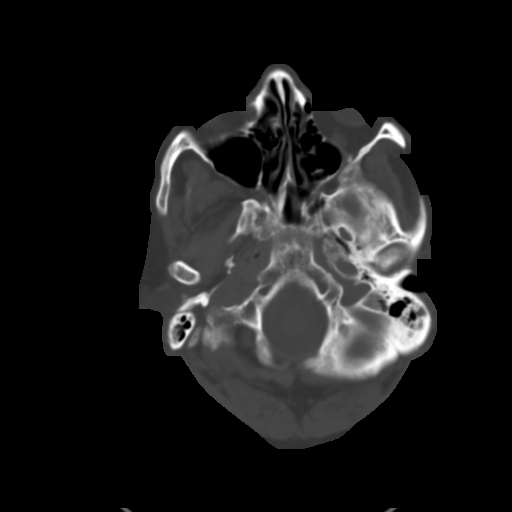
[im 4/36  brain]
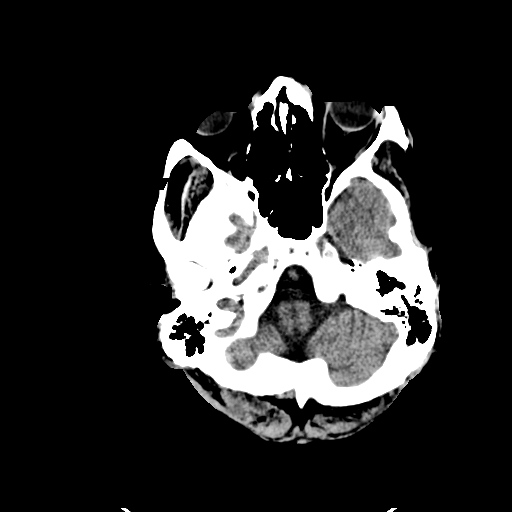
[im 7/36  brain]
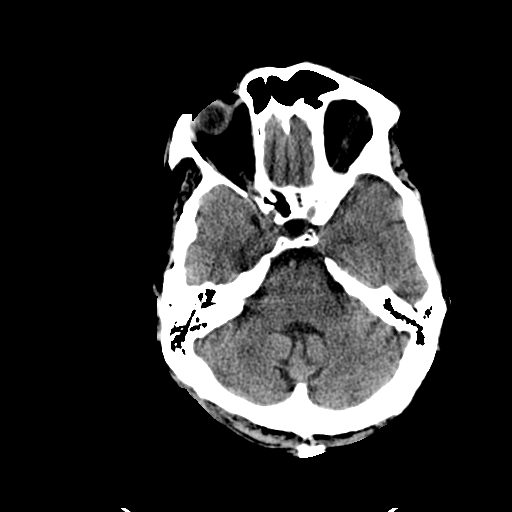
[im 9/36  brain]
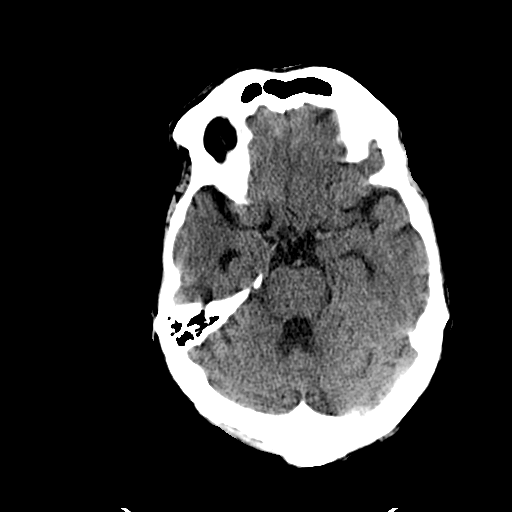
[im 10/36  brain]
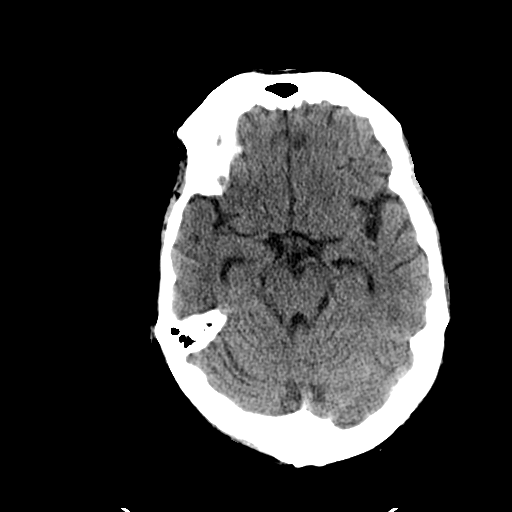
[im 10/36  bone]
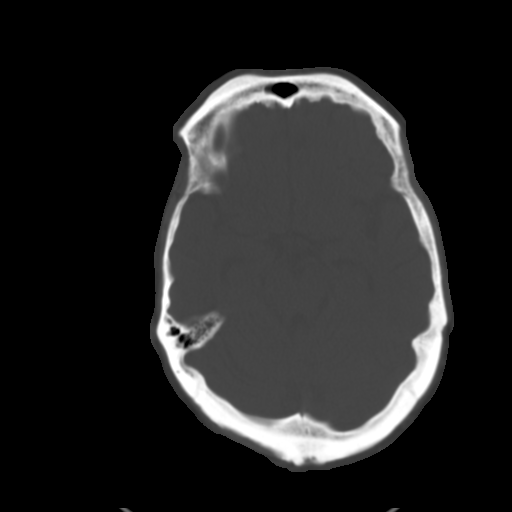
[im 13/36  brain]
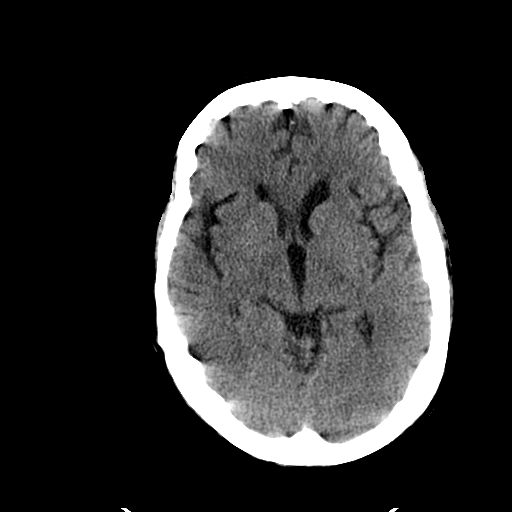
[im 15/36  brain]
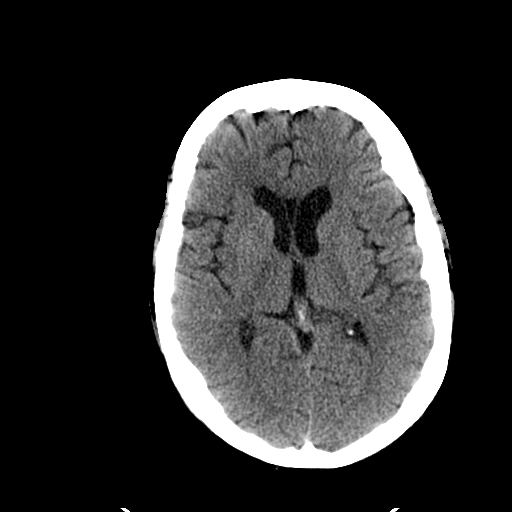
[im 17/36  brain]
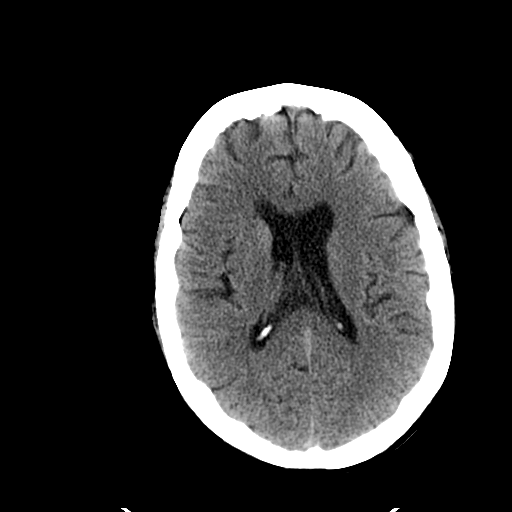
[im 19/36  brain]
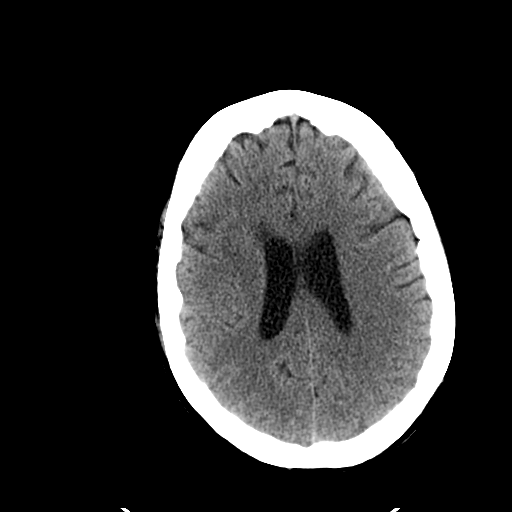
[im 19/36  bone]
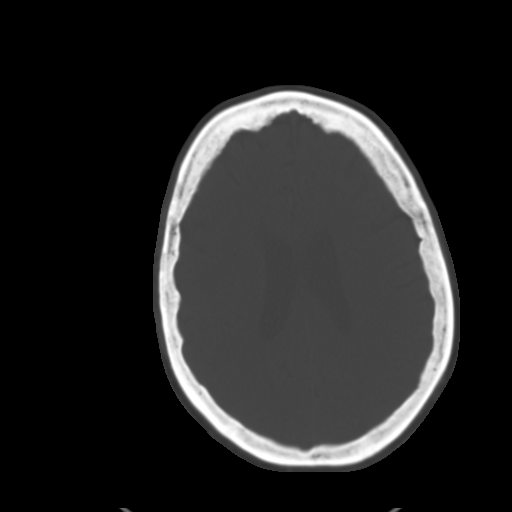
[im 21/36  brain]
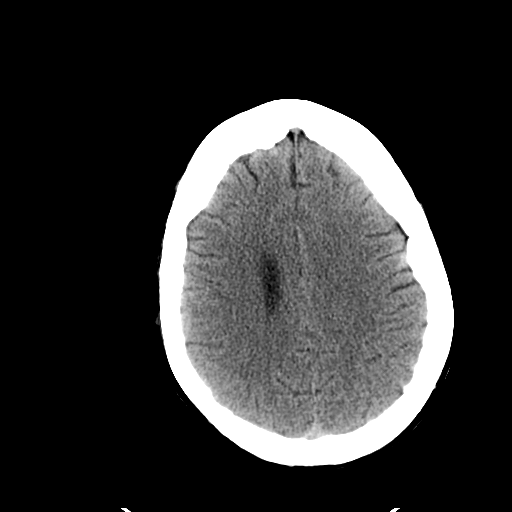
[im 23/36  brain]
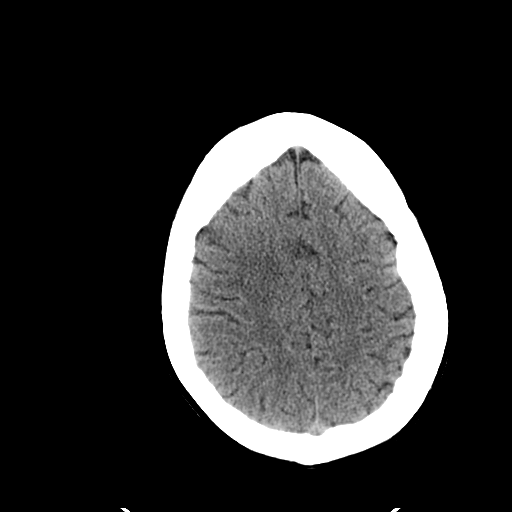
[im 26/36  brain]
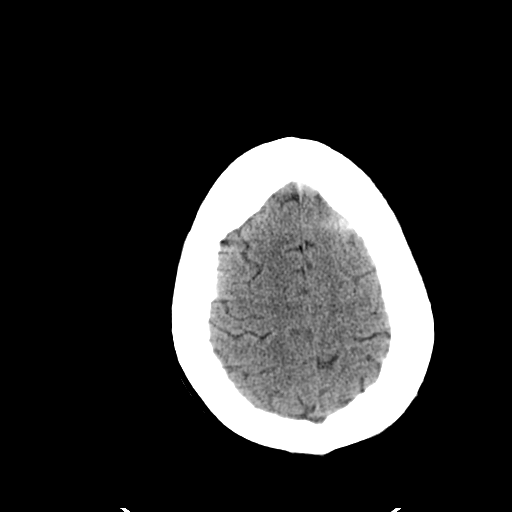
[im 27/36  brain]
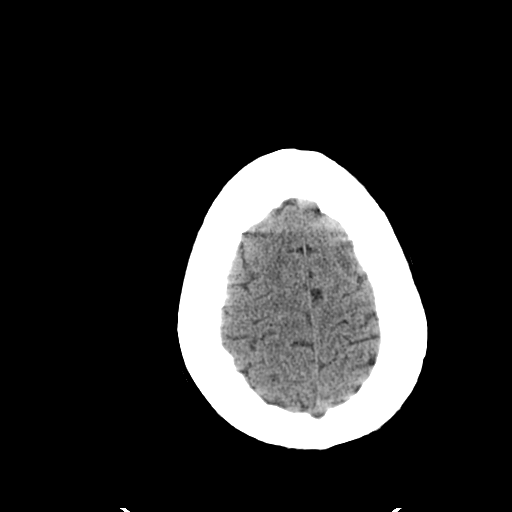
[im 27/36  bone]
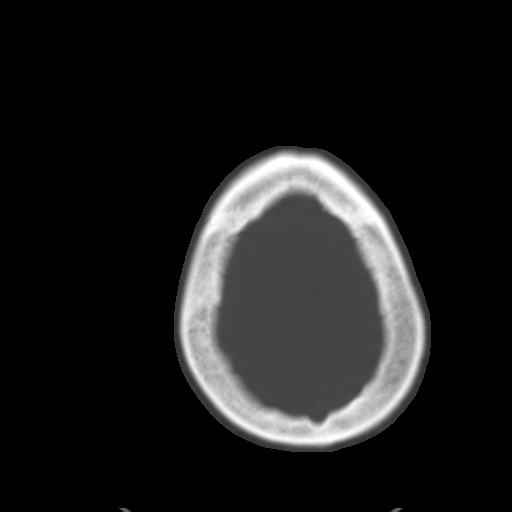
[im 29/36  brain]
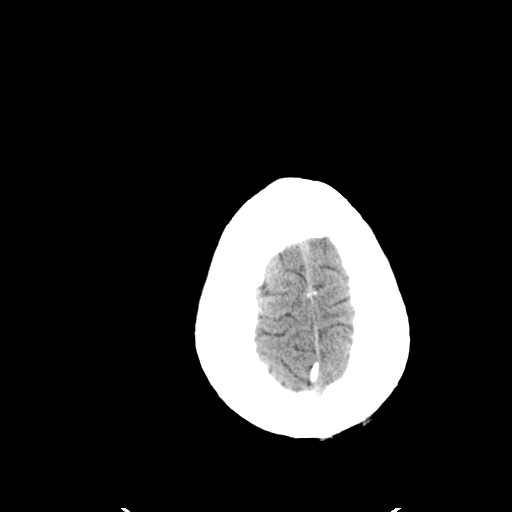
[im 32/36  brain]
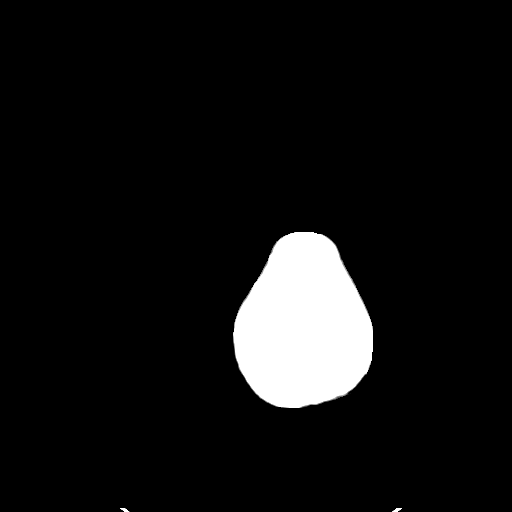
[im 34/36  brain]
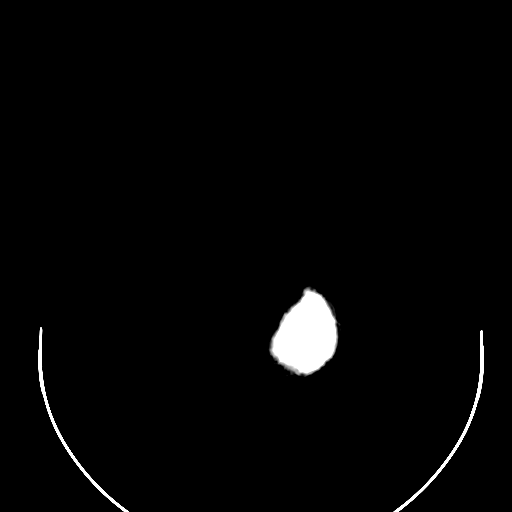

[16 of 30 positions shown; findings below may reference images not displayed]

FINDINGS: Slight volume loss and chronic small vessel disease in
the frontal white matter bilaterally.  These findings are stable.
No acute intracranial abnormality.  Specifically, no hemorrhage,
hydrocephalus, mass lesion, acute infarction, or significant
intracranial injury.  No acute calvarial abnormality.

Visualized paranasal sinuses and mastoids clear.  Orbital soft
tissues unremarkable.
IMPRESSION: No acute intracranial abnormality.

Stable mild atrophy and chronic small vessel disease.

## 2010-11-16 IMAGING — CR DG CHEST 2V
3 series · 3 of 3 positions shown · non-contrast
Comparison: 08/09/2010 and earlier.

CLINICAL DATA: 43-year-old male with fall, pain.

CHEST - 2 VIEW

[w chest lat]
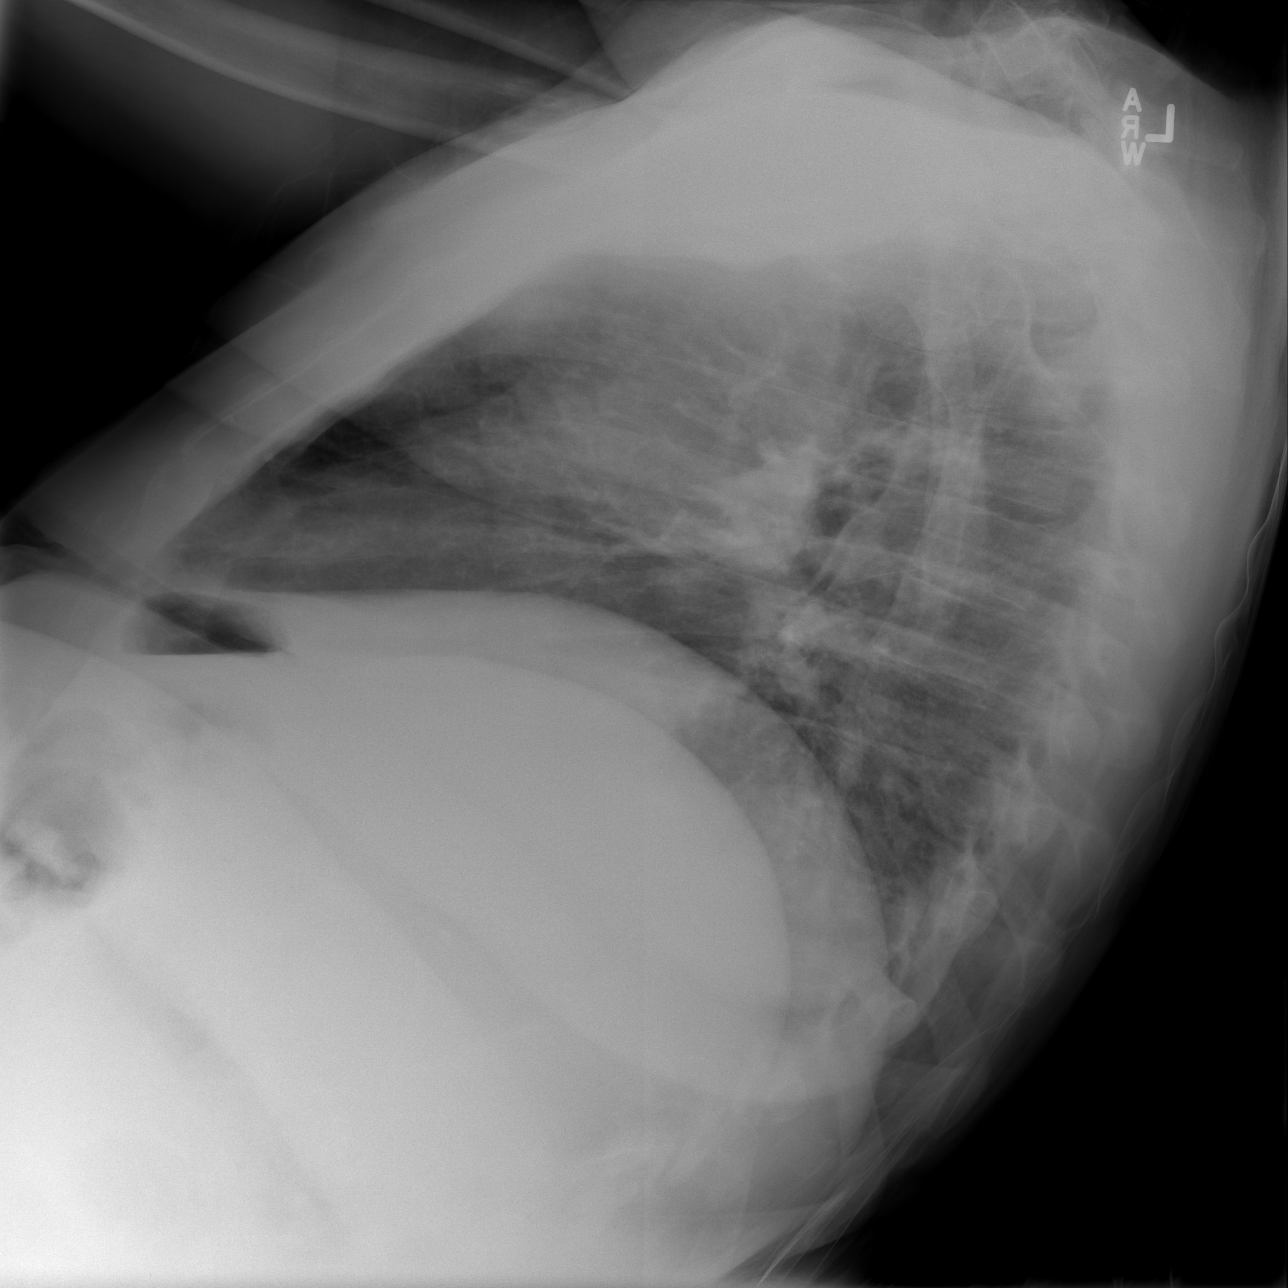

[view not recorded (1 of 2)]
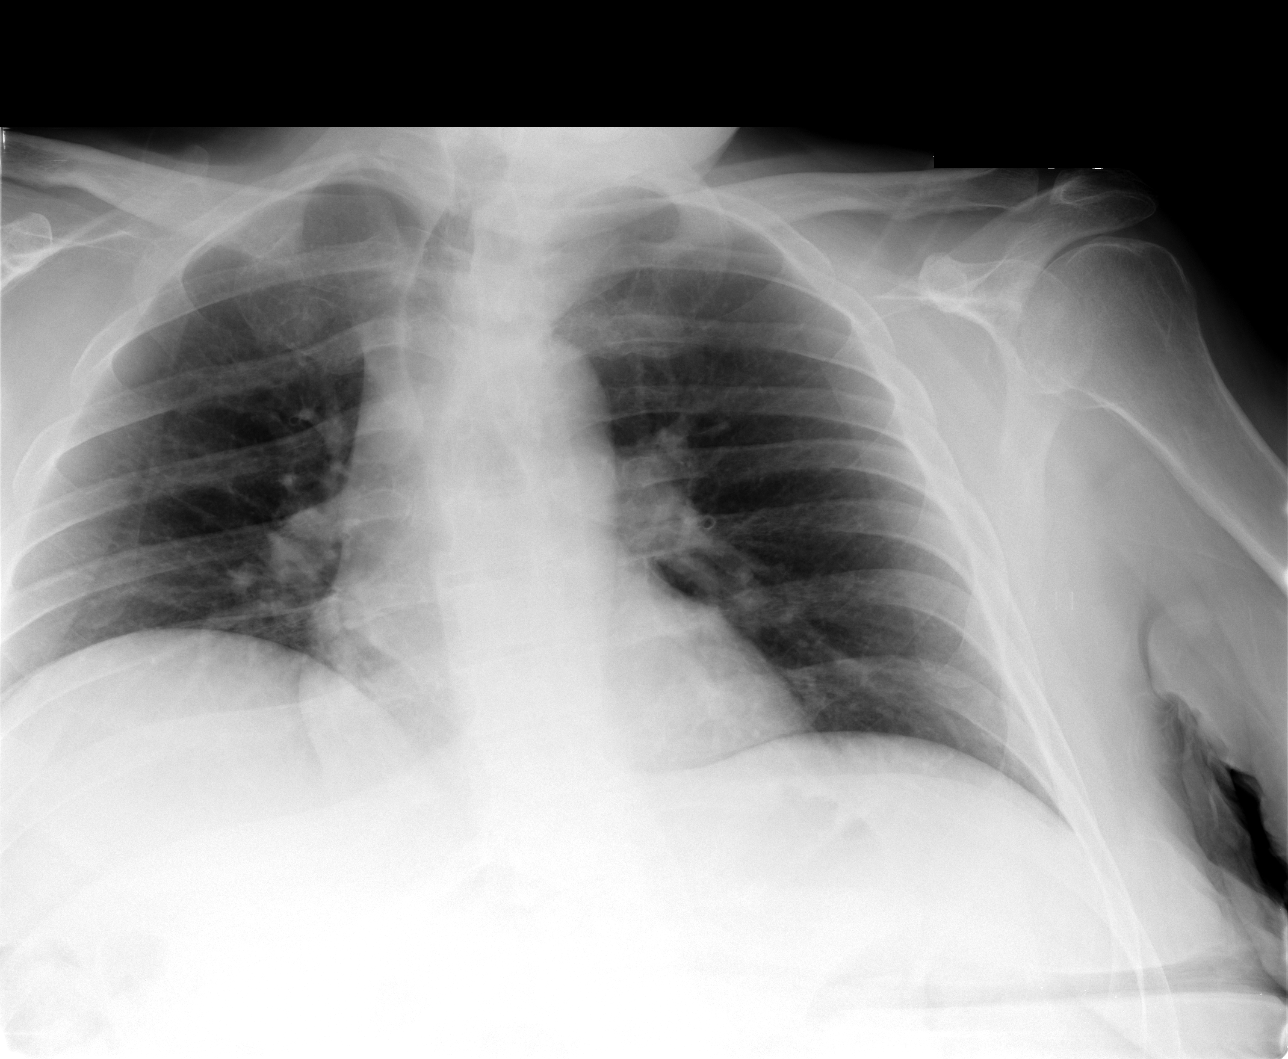

[view not recorded (2 of 2)]
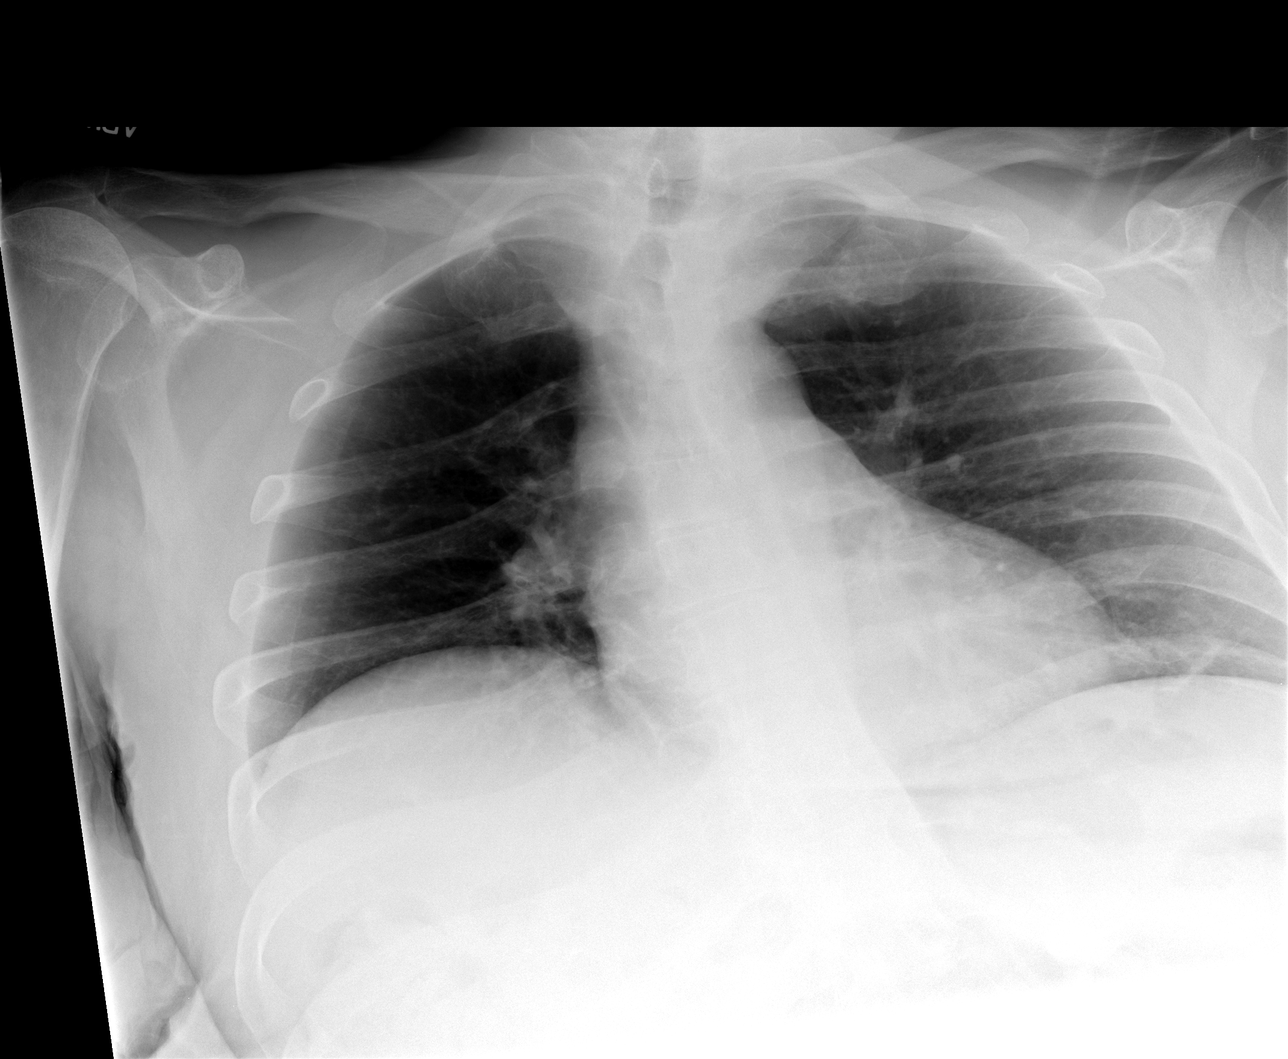

[3 of 3 positions shown; findings below may reference images not displayed]

FINDINGS: Semi upright AP and lateral view the chest.  Continued
low lung volumes.  Cardiac size and mediastinal contours are within
normal limits.  Chronic narrowing of the trachea at the thoracic
inlet.  On some prior exam, the patient has a tracheostomy tube in
this may be sequelae.  The lungs are clear.  No pneumothorax,
effusion, or pulmonary contusion.  Osteopenia.  No definite acute
osseous abnormality.
IMPRESSION: 1. No acute cardiopulmonary abnormality or acute traumatic injury
identified.
2.  Chronic narrowing  of the tracheal contour at the thoracic
inlet may be the sequelae of prior tracheostomy tube.

## 2010-11-16 IMAGING — CR DG PELVIS 1-2V
1 series · 1 of 1 positions shown · non-contrast
Comparison: Multiple prior studies.

CLINICAL DATA: Fall, pain

PELVIS - 1-2 VIEW

[view not recorded]
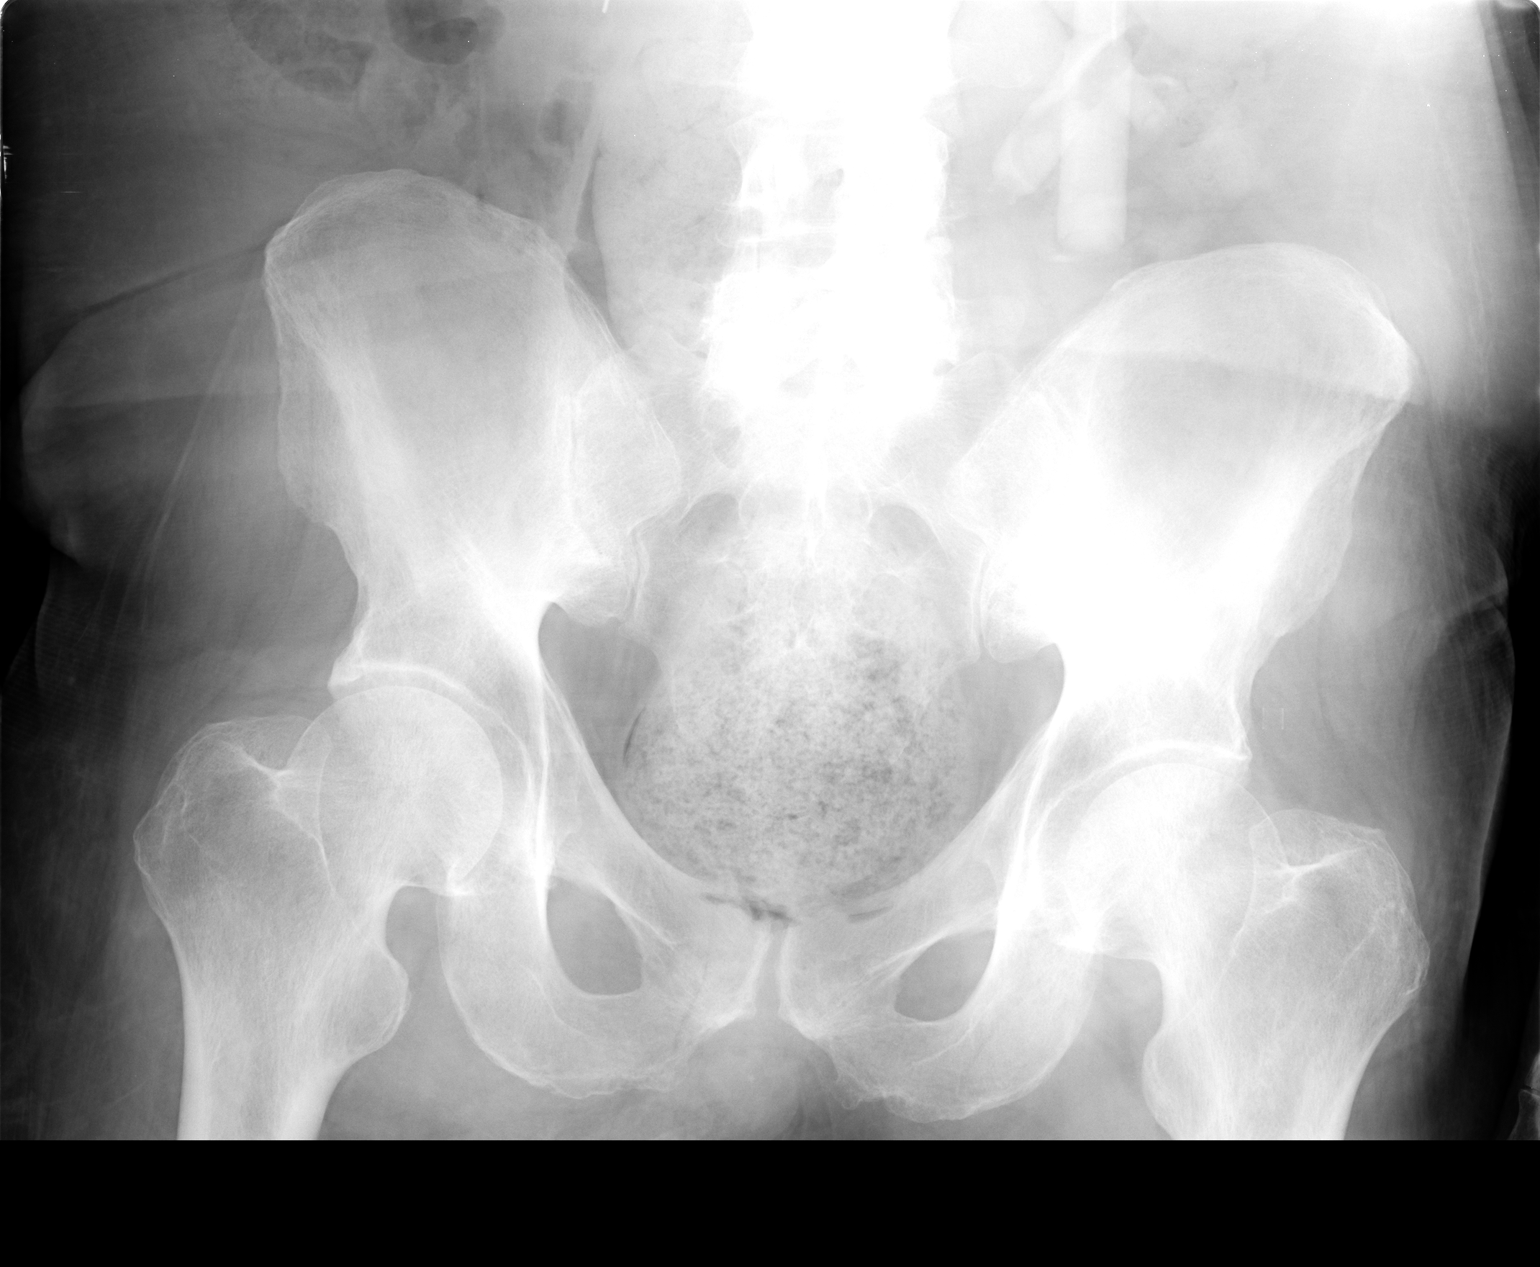

[1 of 1 positions shown; findings below may reference images not displayed]

FINDINGS: Large fecal impaction.  No osseous deformity.  Mild
degenerative change  lower lumbar spine and hips.
IMPRESSION: Large fecal impaction but no visible pelvic fracture.

## 2011-01-25 ENCOUNTER — Inpatient Hospital Stay (HOSPITAL_COMMUNITY)
Admission: EM | Admit: 2011-01-25 | Discharge: 2011-02-02 | DRG: 871 | Disposition: A | Discharge status: Skilled Nursing Facility | Payer: Medicare Other | Attending: Internal Medicine | Admitting: Internal Medicine

## 2011-01-25 DIAGNOSIS — I4891 Unspecified atrial fibrillation: Secondary | ICD-10-CM | POA: Diagnosis present

## 2011-01-25 DIAGNOSIS — E872 Acidosis, unspecified: Secondary | ICD-10-CM | POA: Diagnosis not present

## 2011-01-25 DIAGNOSIS — E2749 Other adrenocortical insufficiency: Secondary | ICD-10-CM | POA: Diagnosis present

## 2011-01-25 DIAGNOSIS — J690 Pneumonitis due to inhalation of food and vomit: Secondary | ICD-10-CM | POA: Diagnosis present

## 2011-01-25 DIAGNOSIS — R5381 Other malaise: Secondary | ICD-10-CM | POA: Diagnosis not present

## 2011-01-25 DIAGNOSIS — Y849 Medical procedure, unspecified as the cause of abnormal reaction of the patient, or of later complication, without mention of misadventure at the time of the procedure: Secondary | ICD-10-CM | POA: Diagnosis not present

## 2011-01-25 DIAGNOSIS — N179 Acute kidney failure, unspecified: Secondary | ICD-10-CM | POA: Diagnosis not present

## 2011-01-25 DIAGNOSIS — Z6835 Body mass index (BMI) 35.0-35.9, adult: Secondary | ICD-10-CM

## 2011-01-25 DIAGNOSIS — I10 Essential (primary) hypertension: Secondary | ICD-10-CM | POA: Diagnosis present

## 2011-01-25 DIAGNOSIS — N390 Urinary tract infection, site not specified: Secondary | ICD-10-CM | POA: Diagnosis present

## 2011-01-25 DIAGNOSIS — T8119XA Other postprocedural shock, initial encounter: Secondary | ICD-10-CM | POA: Diagnosis not present

## 2011-01-25 DIAGNOSIS — F341 Dysthymic disorder: Secondary | ICD-10-CM | POA: Diagnosis present

## 2011-01-25 DIAGNOSIS — Z8674 Personal history of sudden cardiac arrest: Secondary | ICD-10-CM

## 2011-01-25 DIAGNOSIS — E46 Unspecified protein-calorie malnutrition: Secondary | ICD-10-CM | POA: Diagnosis not present

## 2011-01-25 DIAGNOSIS — A419 Sepsis, unspecified organism: Principal | ICD-10-CM | POA: Diagnosis present

## 2011-01-25 DIAGNOSIS — E0590 Thyrotoxicosis, unspecified without thyrotoxic crisis or storm: Secondary | ICD-10-CM | POA: Diagnosis present

## 2011-01-25 DIAGNOSIS — D62 Acute posthemorrhagic anemia: Secondary | ICD-10-CM | POA: Diagnosis not present

## 2011-01-25 DIAGNOSIS — R1310 Dysphagia, unspecified: Secondary | ICD-10-CM | POA: Diagnosis present

## 2011-01-25 DIAGNOSIS — E669 Obesity, unspecified: Secondary | ICD-10-CM | POA: Diagnosis present

## 2011-01-25 DIAGNOSIS — E119 Type 2 diabetes mellitus without complications: Secondary | ICD-10-CM | POA: Diagnosis present

## 2011-01-25 DIAGNOSIS — D696 Thrombocytopenia, unspecified: Secondary | ICD-10-CM | POA: Diagnosis present

## 2011-01-25 DIAGNOSIS — Z931 Gastrostomy status: Secondary | ICD-10-CM

## 2011-01-25 DIAGNOSIS — Y9210 Unspecified residential institution as the place of occurrence of the external cause: Secondary | ICD-10-CM | POA: Diagnosis not present

## 2011-01-25 DIAGNOSIS — G931 Anoxic brain damage, not elsewhere classified: Secondary | ICD-10-CM | POA: Diagnosis present

## 2011-01-25 DIAGNOSIS — Z8614 Personal history of Methicillin resistant Staphylococcus aureus infection: Secondary | ICD-10-CM

## 2011-01-25 DIAGNOSIS — M7981 Nontraumatic hematoma of soft tissue: Secondary | ICD-10-CM | POA: Diagnosis not present

## 2011-01-25 DIAGNOSIS — T45515A Adverse effect of anticoagulants, initial encounter: Secondary | ICD-10-CM | POA: Diagnosis not present

## 2011-01-25 DIAGNOSIS — E876 Hypokalemia: Secondary | ICD-10-CM | POA: Diagnosis not present

## 2011-01-25 DIAGNOSIS — Z880 Allergy status to penicillin: Secondary | ICD-10-CM

## 2011-01-25 LAB — URINALYSIS, ROUTINE W REFLEX MICROSCOPIC
Bilirubin Urine: NEGATIVE
Ketones, ur: 15 mg/dL — AB
Nitrite: NEGATIVE
Specific Gravity, Urine: 1.025 (ref 1.005–1.030)
Urobilinogen, UA: 1 mg/dL (ref 0.0–1.0)

## 2011-01-25 LAB — PROTIME-INR
INR: 3.98 — ABNORMAL HIGH (ref 0.00–1.49)
INR: 4.23 — ABNORMAL HIGH (ref 0.00–1.49)
Prothrombin Time: 38.8 seconds — ABNORMAL HIGH (ref 11.6–15.2)

## 2011-01-25 LAB — DIFFERENTIAL
Basophils Absolute: 0 10*3/uL (ref 0.0–0.1)
Basophils Absolute: 0 10*3/uL (ref 0.0–0.1)
Basophils Relative: 0 % (ref 0–1)
Basophils Relative: 0 % (ref 0–1)
Eosinophils Absolute: 0 10*3/uL (ref 0.0–0.7)
Lymphocytes Relative: 15 % (ref 12–46)
Lymphocytes Relative: 18 % (ref 12–46)
Monocytes Absolute: 1.2 10*3/uL — ABNORMAL HIGH (ref 0.1–1.0)
Neutro Abs: 13.6 10*3/uL — ABNORMAL HIGH (ref 1.7–7.7)
Neutrophils Relative %: 76 % (ref 43–77)
Neutrophils Relative %: 73 % (ref 43–77)

## 2011-01-25 LAB — CARDIAC PANEL(CRET KIN+CKTOT+MB+TROPI)
CK, MB: 2.6 ng/mL (ref 0.3–4.0)
Relative Index: 2.2 (ref 0.0–2.5)

## 2011-01-25 LAB — BASIC METABOLIC PANEL
Chloride: 98 mEq/L (ref 96–112)
Creatinine, Ser: 1.49 mg/dL (ref 0.4–1.5)
GFR calc Af Amer: >60 mL/min (ref >60)
Potassium: 4.5 mEq/L (ref 3.5–5.1)
Sodium: 131 mEq/L — ABNORMAL LOW (ref 135–145)

## 2011-01-25 LAB — CBC
HCT: 31.1 % — ABNORMAL LOW (ref 39.0–52.0)
Hemoglobin: 10.4 g/dL — ABNORMAL LOW (ref 13.0–17.0)
MCHC: 33.4 g/dL (ref 30.0–36.0)
Platelets: 155 10*3/uL (ref 150–400)
RBC: 3.69 MIL/uL — ABNORMAL LOW (ref 4.22–5.81)
RBC: 2.48 MIL/uL — ABNORMAL LOW (ref 4.22–5.81)
RDW: 13.9 % (ref 11.5–15.5)
WBC: 17.9 10*3/uL — ABNORMAL HIGH (ref 4.0–10.5)
WBC: 13.6 10*3/uL — ABNORMAL HIGH (ref 4.0–10.5)

## 2011-01-25 LAB — POCT I-STAT 3, ART BLOOD GAS (G3+)
Acid-base deficit: 5 mmol/L — ABNORMAL HIGH (ref 0.0–2.0)
Acid-base deficit: 3 mmol/L — ABNORMAL HIGH (ref 0.0–2.0)
Bicarbonate: 20.7 mEq/L (ref 20.0–24.0)
O2 Saturation: 51 %
O2 Saturation: 97 %
TCO2: 21 mmol/L (ref 0–100)
pCO2 arterial: 28.5 mmHg — ABNORMAL LOW (ref 35.0–45.0)
pO2, Arterial: 29 mmHg — CL (ref 80.0–100.0)
pO2, Arterial: 88 mmHg (ref 80.0–100.0)

## 2011-01-25 LAB — POCT I-STAT, CHEM 8
BUN: 26 mg/dL — ABNORMAL HIGH (ref 6–23)
Creatinine, Ser: 1.7 mg/dL — ABNORMAL HIGH (ref 0.4–1.5)
Glucose, Bld: 155 mg/dL — ABNORMAL HIGH (ref 70–99)
Potassium: 4.5 mEq/L (ref 3.5–5.1)
Sodium: 136 mEq/L (ref 135–145)

## 2011-01-25 LAB — COMPREHENSIVE METABOLIC PANEL
ALT: 16 U/L (ref 0–53)
Albumin: 2.6 g/dL — ABNORMAL LOW (ref 3.5–5.2)
Alkaline Phosphatase: 78 U/L (ref 39–117)
Calcium: 7.6 mg/dL — ABNORMAL LOW (ref 8.4–10.5)
GFR calc Af Amer: >60 mL/min (ref >60)
Potassium: 4.8 mEq/L (ref 3.5–5.1)
Sodium: 133 mEq/L — ABNORMAL LOW (ref 135–145)
Total Protein: 4.8 g/dL — ABNORMAL LOW (ref 6.0–8.3)

## 2011-01-25 LAB — HEMOGLOBIN AND HEMATOCRIT, BLOOD: Hemoglobin: 6.8 g/dL — CL (ref 13.0–17.0)

## 2011-01-25 LAB — GLUCOSE, CAPILLARY: Glucose-Capillary: 105 mg/dL — ABNORMAL HIGH (ref 70–99)

## 2011-01-25 LAB — APTT: aPTT: 65 seconds — ABNORMAL HIGH (ref 24–37)

## 2011-01-25 LAB — AMYLASE: Amylase: 46 U/L (ref 0–105)

## 2011-01-25 LAB — LACTIC ACID, PLASMA
Lactic Acid, Venous: 4.7 mmol/L — ABNORMAL HIGH (ref 0.5–2.2)
Lactic Acid, Venous: 2.2 mmol/L (ref 0.5–2.2)

## 2011-01-25 LAB — POCT CARDIAC MARKERS
CKMB, poc: <1 ng/mL — ABNORMAL LOW (ref 1.0–8.0)
Myoglobin, poc: 167 ng/mL (ref 12–200)
Troponin i, poc: <0.05 ng/mL (ref 0.00–0.09)

## 2011-01-25 LAB — LIPASE, BLOOD: Lipase: 18 U/L (ref 11–59)

## 2011-01-25 LAB — PHOSPHORUS: Phosphorus: 3.3 mg/dL (ref 2.3–4.6)

## 2011-01-26 DIAGNOSIS — N3000 Acute cystitis without hematuria: Secondary | ICD-10-CM

## 2011-01-26 DIAGNOSIS — R578 Other shock: Secondary | ICD-10-CM

## 2011-01-26 DIAGNOSIS — A419 Sepsis, unspecified organism: Secondary | ICD-10-CM

## 2011-01-26 DIAGNOSIS — J159 Unspecified bacterial pneumonia: Secondary | ICD-10-CM

## 2011-01-26 LAB — CBC
HCT: 21.4 % — ABNORMAL LOW (ref 39.0–52.0)
HCT: 20.5 % — ABNORMAL LOW (ref 39.0–52.0)
Hemoglobin: 7.3 g/dL — ABNORMAL LOW (ref 13.0–17.0)
Hemoglobin: 6 g/dL — CL (ref 13.0–17.0)
Hemoglobin: 6.9 g/dL — CL (ref 13.0–17.0)
MCH: 29 pg (ref 26.0–34.0)
MCH: 28.5 pg (ref 26.0–34.0)
MCHC: 33.7 g/dL (ref 30.0–36.0)
MCV: 84.9 fL (ref 78.0–100.0)
MCV: 84.5 fL (ref 78.0–100.0)
MCV: 84.7 fL (ref 78.0–100.0)
Platelets: 119 10*3/uL — ABNORMAL LOW (ref 150–400)
Platelets: 96 10*3/uL — ABNORMAL LOW (ref 150–400)
RBC: 2.52 MIL/uL — ABNORMAL LOW (ref 4.22–5.81)
RBC: 2.13 MIL/uL — ABNORMAL LOW (ref 4.22–5.81)
RDW: 14.8 % (ref 11.5–15.5)
WBC: 8.9 10*3/uL (ref 4.0–10.5)
WBC: 7.2 10*3/uL (ref 4.0–10.5)

## 2011-01-26 LAB — PROTIME-INR
INR: 2.37 — ABNORMAL HIGH (ref 0.00–1.49)
INR: 1.76 — ABNORMAL HIGH (ref 0.00–1.49)
Prothrombin Time: 26 seconds — ABNORMAL HIGH (ref 11.6–15.2)

## 2011-01-26 LAB — BASIC METABOLIC PANEL
Chloride: 109 mEq/L (ref 96–112)
Creatinine, Ser: 1.2 mg/dL (ref 0.4–1.5)
GFR calc Af Amer: >60 mL/min (ref >60)
Potassium: 4.6 mEq/L (ref 3.5–5.1)
Sodium: 139 mEq/L (ref 135–145)

## 2011-01-26 LAB — GLUCOSE, CAPILLARY: Glucose-Capillary: 76 mg/dL (ref 70–99)

## 2011-01-26 LAB — APTT: aPTT: 45 seconds — ABNORMAL HIGH (ref 24–37)

## 2011-01-26 LAB — URINE CULTURE: Culture  Setup Time: 201201291714

## 2011-01-26 LAB — PREPARE RBC (CROSSMATCH)

## 2011-01-27 LAB — CULTURE, BLOOD (ROUTINE X 2): Culture  Setup Time: 201201291711

## 2011-01-27 LAB — TYPE AND SCREEN
Donor AG Type: NEGATIVE
Donor AG Type: NEGATIVE
Donor AG Type: NEGATIVE
Unit division: 0
Unit division: 0
Unit division: 0
Unit division: 0

## 2011-01-27 LAB — PREPARE FRESH FROZEN PLASMA
Unit division: 0
Unit division: 0
Unit division: 0
Unit division: 0
Unit division: 0

## 2011-01-27 LAB — CBC
HCT: 24.3 % — ABNORMAL LOW (ref 39.0–52.0)
HCT: 24.2 % — ABNORMAL LOW (ref 39.0–52.0)
HCT: 25.1 % — ABNORMAL LOW (ref 39.0–52.0)
Hemoglobin: 8.4 g/dL — ABNORMAL LOW (ref 13.0–17.0)
Hemoglobin: 8.7 g/dL — ABNORMAL LOW (ref 13.0–17.0)
MCH: 29.4 pg (ref 26.0–34.0)
MCH: 29.9 pg (ref 26.0–34.0)
MCHC: 34.6 g/dL (ref 30.0–36.0)
MCV: 85.3 fL (ref 78.0–100.0)
MCV: 85.8 fL (ref 78.0–100.0)
RBC: 2.82 MIL/uL — ABNORMAL LOW (ref 4.22–5.81)
RBC: 2.91 MIL/uL — ABNORMAL LOW (ref 4.22–5.81)
RDW: 14.9 % (ref 11.5–15.5)
WBC: 5.3 10*3/uL (ref 4.0–10.5)
WBC: 4.4 10*3/uL (ref 4.0–10.5)

## 2011-01-27 LAB — DIFFERENTIAL
Basophils Absolute: 0 10*3/uL (ref 0.0–0.1)
Basophils Relative: 0 % (ref 0–1)
Eosinophils Relative: 1 % (ref 0–5)
Lymphocytes Relative: 15 % (ref 12–46)
Lymphocytes Relative: 21 % (ref 12–46)
Lymphocytes Relative: 22 % (ref 12–46)
Lymphs Abs: 0.8 10*3/uL (ref 0.7–4.0)
Lymphs Abs: 0.9 10*3/uL (ref 0.7–4.0)
Lymphs Abs: 1 10*3/uL (ref 0.7–4.0)
Monocytes Absolute: 0.6 10*3/uL (ref 0.1–1.0)
Monocytes Relative: 15 % — ABNORMAL HIGH (ref 3–12)
Monocytes Relative: 15 % — ABNORMAL HIGH (ref 3–12)
Neutro Abs: 3.8 10*3/uL (ref 1.7–7.7)
Neutro Abs: 2.6 10*3/uL (ref 1.7–7.7)
Neutrophils Relative %: 62 % (ref 43–77)
Neutrophils Relative %: 60 % (ref 43–77)

## 2011-01-27 LAB — GLUCOSE, CAPILLARY
Glucose-Capillary: 80 mg/dL (ref 70–99)
Glucose-Capillary: 90 mg/dL (ref 70–99)
Glucose-Capillary: 80 mg/dL (ref 70–99)

## 2011-01-27 LAB — BASIC METABOLIC PANEL
BUN: 10 mg/dL (ref 6–23)
CO2: 25 mEq/L (ref 19–32)
Chloride: 110 mEq/L (ref 96–112)
Creatinine, Ser: 0.95 mg/dL (ref 0.4–1.5)
Glucose, Bld: 89 mg/dL (ref 70–99)
Potassium: 3.3 mEq/L — ABNORMAL LOW (ref 3.5–5.1)

## 2011-01-27 LAB — PREPARE PLATELETS: Unit division: 0

## 2011-01-27 LAB — PROTIME-INR: INR: 1.5 — ABNORMAL HIGH (ref 0.00–1.49)

## 2011-01-27 NOTE — Letter (Signed)
Summary: History Form  History Form   Imported By: Jacklynn Ganong 11/03/2010 13:38:02  _____________________________________________________________________  External Attachment:    Type:   Image     Comment:   External Document

## 2011-01-28 ENCOUNTER — Inpatient Hospital Stay (HOSPITAL_COMMUNITY): Payer: Medicare Other

## 2011-01-28 DIAGNOSIS — A419 Sepsis, unspecified organism: Secondary | ICD-10-CM

## 2011-01-28 DIAGNOSIS — M79609 Pain in unspecified limb: Secondary | ICD-10-CM

## 2011-01-28 DIAGNOSIS — R578 Other shock: Secondary | ICD-10-CM

## 2011-01-28 DIAGNOSIS — J159 Unspecified bacterial pneumonia: Secondary | ICD-10-CM

## 2011-01-28 DIAGNOSIS — N3000 Acute cystitis without hematuria: Secondary | ICD-10-CM

## 2011-01-28 LAB — GLUCOSE, CAPILLARY
Glucose-Capillary: 104 mg/dL — ABNORMAL HIGH (ref 70–99)
Glucose-Capillary: 84 mg/dL (ref 70–99)
Glucose-Capillary: 93 mg/dL (ref 70–99)

## 2011-01-28 LAB — PROTIME-INR
INR: 1.23 (ref 0.00–1.49)
Prothrombin Time: 15.7 seconds — ABNORMAL HIGH (ref 11.6–15.2)

## 2011-01-28 LAB — CBC
Hemoglobin: 9.2 g/dL — ABNORMAL LOW (ref 13.0–17.0)
MCHC: 34.2 g/dL (ref 30.0–36.0)
Platelets: 79 10*3/uL — ABNORMAL LOW (ref 150–400)

## 2011-01-28 LAB — APTT: aPTT: 29 seconds (ref 24–37)

## 2011-01-28 LAB — URINE CULTURE
Colony Count: NO GROWTH
Culture: NO GROWTH

## 2011-01-28 LAB — MAGNESIUM: Magnesium: 1.6 mg/dL (ref 1.5–2.5)

## 2011-01-28 LAB — BASIC METABOLIC PANEL
CO2: 23 mEq/L (ref 19–32)
Calcium: 7.8 mg/dL — ABNORMAL LOW (ref 8.4–10.5)
GFR calc Af Amer: >60 mL/min (ref >60)
GFR calc non Af Amer: >60 mL/min (ref >60)
Sodium: 141 mEq/L (ref 135–145)

## 2011-01-28 LAB — PHOSPHORUS: Phosphorus: 1.4 mg/dL — ABNORMAL LOW (ref 2.3–4.6)

## 2011-01-29 DIAGNOSIS — A419 Sepsis, unspecified organism: Secondary | ICD-10-CM

## 2011-01-29 DIAGNOSIS — N3000 Acute cystitis without hematuria: Secondary | ICD-10-CM

## 2011-01-29 DIAGNOSIS — R578 Other shock: Secondary | ICD-10-CM

## 2011-01-29 DIAGNOSIS — J159 Unspecified bacterial pneumonia: Secondary | ICD-10-CM

## 2011-01-29 LAB — CBC
HCT: 26.4 % — ABNORMAL LOW (ref 39.0–52.0)
HCT: 30.7 % — ABNORMAL LOW (ref 39.0–52.0)
Hemoglobin: 8.7 g/dL — ABNORMAL LOW (ref 13.0–17.0)
Hemoglobin: 8.7 g/dL — ABNORMAL LOW (ref 13.0–17.0)
MCH: 29.4 pg (ref 26.0–34.0)
MCHC: 33 g/dL (ref 30.0–36.0)
MCHC: 34.1 g/dL (ref 30.0–36.0)
MCV: 86.1 fL (ref 78.0–100.0)
MCV: 87.2 fL (ref 78.0–100.0)
MCV: 87.4 fL (ref 78.0–100.0)
Platelets: 119 10*3/uL — ABNORMAL LOW (ref 150–400)
RDW: 15.2 % (ref 11.5–15.5)
RDW: 15.6 % — ABNORMAL HIGH (ref 11.5–15.5)
WBC: 7.5 10*3/uL (ref 4.0–10.5)

## 2011-01-29 LAB — BASIC METABOLIC PANEL
BUN: 9 mg/dL (ref 6–23)
Calcium: 7.6 mg/dL — ABNORMAL LOW (ref 8.4–10.5)
Chloride: 106 mEq/L (ref 96–112)
Creatinine, Ser: 0.87 mg/dL (ref 0.4–1.5)
GFR calc Af Amer: >60 mL/min (ref >60)
GFR calc non Af Amer: >60 mL/min (ref >60)
Sodium: 134 mEq/L — ABNORMAL LOW (ref 135–145)

## 2011-01-29 LAB — PHOSPHORUS: Phosphorus: 19.5 mg/dL (ref 2.3–4.6)

## 2011-01-29 LAB — APTT: aPTT: 36 seconds (ref 24–37)

## 2011-01-29 LAB — GLUCOSE, CAPILLARY: Glucose-Capillary: 113 mg/dL — ABNORMAL HIGH (ref 70–99)

## 2011-01-29 LAB — PROTIME-INR: Prothrombin Time: 15.8 seconds — ABNORMAL HIGH (ref 11.6–15.2)

## 2011-01-29 LAB — MAGNESIUM: Magnesium: 1.9 mg/dL (ref 1.5–2.5)

## 2011-01-30 LAB — CBC
HCT: 29.5 % — ABNORMAL LOW (ref 39.0–52.0)
Hemoglobin: 9.6 g/dL — ABNORMAL LOW (ref 13.0–17.0)
MCH: 28.2 pg (ref 26.0–34.0)
MCHC: 32.5 g/dL (ref 30.0–36.0)
MCV: 86.8 fL (ref 78.0–100.0)
Platelets: 157 K/uL (ref 150–400)
RBC: 3.4 MIL/uL — ABNORMAL LOW (ref 4.22–5.81)
RDW: 15.8 % — ABNORMAL HIGH (ref 11.5–15.5)
WBC: 6.9 K/uL (ref 4.0–10.5)

## 2011-01-30 LAB — BASIC METABOLIC PANEL WITH GFR
BUN: 13 mg/dL (ref 6–23)
CO2: 25 meq/L (ref 19–32)
Calcium: 8.2 mg/dL — ABNORMAL LOW (ref 8.4–10.5)
Chloride: 104 meq/L (ref 96–112)
Creatinine, Ser: 0.85 mg/dL (ref 0.4–1.5)
GFR calc non Af Amer: >60 mL/min
Glucose, Bld: 99 mg/dL (ref 70–99)
Potassium: 3.7 meq/L (ref 3.5–5.1)
Sodium: 138 meq/L (ref 135–145)

## 2011-01-30 NOTE — Consult Note (Signed)
[name], [name]              ACCOUNT NO.:  [id number]  MEDICAL RECORD NO.:  [id number]          PATIENT TYPE:  INP  LOCATION:  2115                         FACILITY:  [facility]  PHYSICIAN:  Janetta Hora. Lindey Renzulli, MD  DATE OF BIRTH:  02-03-1966  DATE OF CONSULTATION:  01/26/2011 DATE OF DISCHARGE:                                CONSULTATION   REQUESTING PHYSICIAN:  Kalman Shan, MD, Critical Care Service.  REASON FOR CONSULTATION:  Right thigh hematoma.  HISTORY OF PRESENT ILLNESS:  The patient is a 45 year old male who is admitted approximately at 10:00 p.m. yesterday for an ongoing diagnosis of "sepsis."  The patient had a right groin central line inserted by the emergency room.  Apparently, he arrived to the emergency room initially hypotensive and was resuscitated and improved and then was later admitted to 2100 ICU.  His admission hemoglobin was approximately 10. This dropped to 6 over the course of last night and today.  The patient's blood pressure has been still hypotensive with systolic pressure running in the low 100s to low 90s.  The patient is chronically on Coumadin.  His INR on admission was 3.98.  Heart rate has been in the 70s to 80s overnight.  He has not been oliguric. The patient was noted to have ecchymosis along his right chest wall and was sent for a CT scan of the abdomen and pelvis to rule out retroperitoneal bleed as a source of his declining hemoglobin.  This was thought possibly be due to coagulopathy from his Coumadin.  The patient was noted on the CT scan to have a large hematoma across the abdominal obliques on the right side as well as within the adductor muscles of the thigh and the right leg.  Of note, the central catheter appeared to be within the common femoral vein with tip barely within the vein.  I reviewed all of these images today as well as the attached report.  Of note, there was no retroperitoneal hematoma.  The CT scan was  noncontrast.  PAST MEDICAL HISTORY:  Multiple urinary tract infections and sepsis.  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  All of these chronic medical problems are fairly well controlled as an outpatient, and he currently resides in a skilled nursing facility.  PAST SURGICAL HISTORY:  Tracheostomy and PEG.  REVIEW OF SYSTEMS:  Unable to obtain from the patient.  FAMILY HISTORY:  Unable to obtain from the patient.  SOCIAL HISTORY:  He lives in a skilled nursing facility.  [place] mother [place]  ALLERGIES:  Allergies were obtained from the old chart which include LASIX, MORPHINE, PERIDIUM, and PENICILLIN, unknown reaction to all of these.  MEDICATIONS:  Flagyl, insulin, Protonix, and vancomycin.  PHYSICAL EXAMINATION:  VITAL SIGNS:  Blood pressure is 95/63, heart rate 79 and regular, respirations 14, oxygen saturation 100% on 1 L of oxygen, temperature is 98.8. GENERAL:  He is a white male.  He answers questions appropriately.  He is alert and oriented x1.  He is not really ill-appearing.  HEENT:  Unremarkable. NECK:  2+ carotid pulses. CHEST:  Decreased breath sounds in the bases bilaterally. CARDIAC:  Regular rhythm without murmur, ecchymosis along the right chest wall. ABDOMEN:  Obese, soft, nontender, and nondistended.  He has a fullness in the right abdominal wall with no ecchymosis or skin stretching. EXTREMITIES:  He has no significant lower extremity edema.  The right thigh circumference is approximately 30% larger than the left thigh circumference.  He has 2+ dorsalis pedis pulses bilaterally.  He does have some fullness in the medial aspect of the left thigh, which is tender to palpation.  He has no ecchymosis in this region.  It is nonpulsatile.  His feet and hands are pink and warm.  LABORATORY DATA:  BUN 20, creatinine  1.2, potassium 4.6, CO2 of 23, glucose 103, white blood cell count 5.0, hemoglobin 6.9, platelets 78, INR 1.76.  ASSESSMENT:  Hematoma, right thigh and abdominal wall secondary to right leg central line.  The catheter is exiting the thigh approximately 12 cm below the inguinal ligament and is a very low stick.  Most likely there has either been extravasation of blood or IV fluids into the thigh or he has had bleeding from the stick or combination of both of these.  The catheter was barely within the lumen of the right femoral vein on CT scan.  PLAN: 1. Remove right leg triple-lumen and hold direct pressure. 2. Resuscitate to a hemoglobin of 10.  There is some question if the     patient continues to bleed. 3. Aggressively correct his coagulopathy with fresh frozen plasma and     the platelets to normalize both of these fractures in case he     requires an operation. 4. I believe his bleeding should stop with these conservative measures     alone.  The hematoma is so extensive, there is really no focal     point to drain this currently.  It is all within muscle tissues and     tissue planes.  He currently has no skin compromise or no obvious     nerve compression from the hematoma. 5. If he continues to bleed, we will consider exploring his right     thigh at the insertion site of his central line, however, all of     his hemoglobin and coagulation factors need to be aggressively     corrected prior to this.  We will continue to follow.     Janetta Hora. Jenesys Casseus, MD     CEF/MEDQ  D:  01/26/2011  T:  01/27/2011  Job:  [id number]  Electronically Signed by Fabienne Bruns MD on 01/30/2011 01:14:45 PM

## 2011-01-31 ENCOUNTER — Inpatient Hospital Stay (HOSPITAL_COMMUNITY): Payer: Medicare Other

## 2011-01-31 LAB — CULTURE, BLOOD (ROUTINE X 2): Culture: NO GROWTH

## 2011-01-31 LAB — BASIC METABOLIC PANEL
BUN: 15 mg/dL (ref 6–23)
CO2: 23 mEq/L (ref 19–32)
Chloride: 107 mEq/L (ref 96–112)
Creatinine, Ser: 0.9 mg/dL (ref 0.4–1.5)
Potassium: 4 mEq/L (ref 3.5–5.1)

## 2011-01-31 LAB — CBC
HCT: 29.7 % — ABNORMAL LOW (ref 39.0–52.0)
Hemoglobin: 9.8 g/dL — ABNORMAL LOW (ref 13.0–17.0)
MCH: 29.1 pg (ref 26.0–34.0)
MCV: 88.1 fL (ref 78.0–100.0)
RBC: 3.37 MIL/uL — ABNORMAL LOW (ref 4.22–5.81)

## 2011-02-01 LAB — CBC
HCT: 33.5 % — ABNORMAL LOW (ref 39.0–52.0)
Hemoglobin: 11.3 g/dL — ABNORMAL LOW (ref 13.0–17.0)
MCHC: 33.7 g/dL (ref 30.0–36.0)
MCV: 87.7 fL (ref 78.0–100.0)
WBC: 8.8 10*3/uL (ref 4.0–10.5)

## 2011-02-01 LAB — GLUCOSE, CAPILLARY
Glucose-Capillary: 100 mg/dL — ABNORMAL HIGH (ref 70–99)
Glucose-Capillary: 123 mg/dL — ABNORMAL HIGH (ref 70–99)

## 2011-02-01 LAB — BASIC METABOLIC PANEL
BUN: 20 mg/dL (ref 6–23)
CO2: 23 mEq/L (ref 19–32)
Glucose, Bld: 103 mg/dL — ABNORMAL HIGH (ref 70–99)
Potassium: 4.1 mEq/L (ref 3.5–5.1)
Sodium: 135 mEq/L (ref 135–145)

## 2011-02-02 LAB — CULTURE, BLOOD (ROUTINE X 2)
Culture  Setup Time: 201201312047
Culture: NO GROWTH

## 2011-02-02 LAB — GLUCOSE, CAPILLARY
Glucose-Capillary: 98 mg/dL (ref 70–99)
Glucose-Capillary: 84 mg/dL (ref 70–99)

## 2011-02-02 LAB — CBC
Platelets: 205 10*3/uL (ref 150–400)
RBC: 3.38 MIL/uL — ABNORMAL LOW (ref 4.22–5.81)
WBC: 7.9 10*3/uL (ref 4.0–10.5)

## 2011-02-03 NOTE — Discharge Summary (Addendum)
[name], [name]              ACCOUNT NO.:  [id number]  MEDICAL RECORD NO.:  [id number]           PATIENT TYPE:  I  LOCATION:  5029                         FACILITY:  [facility]  PHYSICIAN:  [name], M.D.       DATE OF BIRTH:  [birth year]  DATE OF ADMISSION:  01/25/2011 DATE OF DISCHARGE:  02/02/2011                              DISCHARGE SUMMARY   PRIMARY CARE PROVIDER:  [name], MD.  DISCHARGE DIAGNOSES: 1. Septic shock, source aspiration pneumonia.  Resolved. 2. Hemorrhagic shock secondary to large groin hematoma post femoral     line insertion in the setting of coagulopathy secondary to     Coumadin. 3. Acute Strickland loss anemia. 4. Thigh oblique hematoma. 5. Proximal atrial fibrillation. 6. Hypoxic brain injury. 7. Chronic dysphagia. 8. Hyperthyroid.  DISCHARGE MEDICATIONS: 1. Buspirone 10 mg 1 tab b.i.d. 2. Certa-Vite liquid 15 mL per tube daily. 3. Citalopram 40 mg per tube daily at bedtime. 4. Furadantin suspension 25 mg per 5 mL 20 mL per tube daily at     bedtime. 5. Lorazepam 0.5 mg per tube t.i.d. as needed for agitation. 6. Lovaza  1 gram per tube b.i.d. 7. Reglan 10 mg per tube q.i.d. 8. MiraLax 17 grams per tube daily. 9. Norco 5/325 one tablet per tube every 4 hours as needed for pain. 10.Pantoprazole oral suspension 20 mg per mL 10 mL per tube b.i.d. 11.Potassium chloride liquid 15 mL per tube daily. 12.Tapazole 5 mg per tube b.i.d.  MEDICATIONS DISCONTINUED:  During this hospitalization: 1. Warfarin 3 mg 1 tablet per tube every evening. 2. Warfarin 2.5 mg per tube every evening.  LABORATORY DATA: 1. Chest x-ray January 25, 2011 showed no active cardiopulmonary     disease. 2. Chest x-ray January 26, 2011, developing right basilar infiltrate     versus atelectasis.  CT abdomen and pelvis January 26, 2011 yields     large hematoma involving the lateral and oblique abdominal     musculature on the right extending upward into the thorax.   Second     large hematoma involving the abductor muscles of the medial right     thigh.  No evidence of retroperitoneal or intraperitoneal     hemorrhage or hematoma.  Right femoral central venous catheter tip     fairly into the right common femoral vein.  Small right pleural     effusion and associated passive atelectasis in the right lower     lobe.  Chest x-ray done January 26, 2011 yields left jugular     central venous catheter tip in the SVC.  No acute complicating     features.  Improved aeration of the right lung base since earlier     in the day with mild atelectasis and/or pneumonia persisting.  No     new abnormalities.  Chest x-ray done January 27, 2011 yields a     stable chest.  Chest x-ray done January 28, 2011 left PICC line tip     projects in the mid right atrium.  This could be pulled back 20-25     mm for  positioning at the SVC/RA junction.  Increasing right base     collapse/consolidation and pleural effusion. 3. CT of the head January 31, 2011 yields small vessel ischemic change     and brain atrophy.  No acute intracranial abnormalities noted.  4.     Chest x-ray done January 31, 2011 yields improve arenation with     nearly completely resolving interstitial edema.  Suspect residual     small bilateral pleural effusions with right base air space     disease, either infection or atelectasis. 4. Pertinent labs, January 25, 2011 sodium 131, potassium 4.5,     chloride 98, CO2 20, glucose 158, BUN 23, creatinine 1.49.  PT     38.8, INR 3.98.  Urinalysis with 15 ketones.  Otherwise, negative     FOBT negative.  Arterial Strickland gas yields pH of 7.33, CO2 38.8, O2     29, bicarb 20.7, total CO2 22.  MRSA PCR screening negative.  D-     dimer 0.24.  WBCs 13.6, hemoglobin 7.0, hematocrit 20.9.  Lactic     acid 2.2.  Amylase on January 25, 2011 of 46, lipase is 18,     magnesium 1.9, phosphorus 3.3.  Random cortisol 22.1.  TSH 2.703.     CBC on February 02, 2011 yields a  white count of 7.9, hemoglobin of     9.8, hematocrit 29.5, platelets 205.  Sodium 135, potassium 4.1,     chloride 102, CO2 23, BUN 20, creatinine 0.9, glucose 103.  CONSULTATIONS:  Steven Hora. Fields, MD with Vascular Surgery.  BRIEF HISTORY:  [name] is a 45 year old white male nursing home resident for the last 2 years secondary to anoxic brain injury.  History of MRSA and VRE.  Multiple admissions for hypotension and UTI.  On January 25, 2011, he developed hypotension and was found unresponsive with cyanosis at the SNF.  He was sent to [facility] ED.  He had emesis.  He remained hypotensive and poor IV access with EDP  placed right femoral line.  PCCM was asked to admit patient.  He was later found to have mildly supratherapeutic INR of 4.23.  He was given fresh frozen plasma. The following day, he remained hypotensive and his hemoglobin dropped from 10-6 and he was noted to have ecchymosis at the right axilla and right chest wall.  He was transfused and a CT abdomen and pelvis was obtained which revealed a large hematoma across the right abdominal oblique and abductor muscles of the right thigh and leg.  [facility]/Dr. Darrick Strickland evaluated the patient and recommended removal of the line, correction of coagulopathy and followed for continued bleeding.  He was transferred out to the medical floor on February 01, 2011 where Team 3 of the hospitalist group took over his care.  HOSPITAL COURSE BY PROBLEM: 1. Septic shock.  Source aspiration pneumonia.  Resolved. 2. Hemorrhagic shock due to large groin hematoma post femoral line     insertion in the setting of coagulopathy secondary to Coumadin.     Resolved. 3. Acute Strickland loss anemia status post 8 units of packed RBCs.     Stable.  No new signs of bleeding. 4. Thigh abdominal oblique hematoma, improving. 5. Paroxysmal atrial fibrillation.  Maintaining sinus rhythm/sinus     tach.  Was not on rate controlling meds or antiarrhythmic at the      SNF. 6. Hypoxic brain injury at baseline. 7. Chronic dysphagia after anoxic brain injury.  Speech Therapy eval  done this admission.  Recommend D3 diet with full supervision at     meals.  Also recommend continuing meds via the PEG and all     meaningful nutrition via the PEG as well as 1400 mL of free water     per 24 hours. 8. Hyperthyroidism.  The patient on Tapazole prior to admission will     resume. 9. Physical examination was noted on rounding note dated February 02, 2011.  FOLLOWUP:  The patient to be discharged to skilled nursing facility. The facility physician to reconsider need for restarting Coumadin.  CONDITION ON:  Discharge stable.  Time spent on this discharge was 45 minutes.     Gwenyth Bender, NP   ______________________________ [name], M.D.    KMB/MEDQ  D:  02/02/2011  T:  02/02/2011  Job:  [id number]  Electronically Signed by [name] M.D. on 02/03/2011 07:02:47 PM Electronically Signed by Toya Smothers  on 02/22/2011 08:28:00 AM

## 2011-02-17 ENCOUNTER — Emergency Department (HOSPITAL_COMMUNITY): Payer: Medicare Other

## 2011-02-17 ENCOUNTER — Inpatient Hospital Stay (HOSPITAL_COMMUNITY)
Admission: EM | Admit: 2011-02-17 | Discharge: 2011-02-20 | DRG: 206 | Disposition: A | Discharge status: Skilled Nursing Facility | Payer: Medicare Other | Attending: Internal Medicine | Admitting: Internal Medicine

## 2011-02-17 DIAGNOSIS — T17908A Unspecified foreign body in respiratory tract, part unspecified causing other injury, initial encounter: Principal | ICD-10-CM | POA: Diagnosis present

## 2011-02-17 DIAGNOSIS — IMO0002 Reserved for concepts with insufficient information to code with codable children: Secondary | ICD-10-CM | POA: Diagnosis present

## 2011-02-17 DIAGNOSIS — R1110 Vomiting, unspecified: Secondary | ICD-10-CM | POA: Diagnosis present

## 2011-02-17 DIAGNOSIS — Z931 Gastrostomy status: Secondary | ICD-10-CM

## 2011-02-17 DIAGNOSIS — E2749 Other adrenocortical insufficiency: Secondary | ICD-10-CM | POA: Diagnosis present

## 2011-02-17 DIAGNOSIS — I4891 Unspecified atrial fibrillation: Secondary | ICD-10-CM | POA: Diagnosis present

## 2011-02-17 DIAGNOSIS — K219 Gastro-esophageal reflux disease without esophagitis: Secondary | ICD-10-CM | POA: Diagnosis present

## 2011-02-17 DIAGNOSIS — R1310 Dysphagia, unspecified: Secondary | ICD-10-CM | POA: Diagnosis present

## 2011-02-17 DIAGNOSIS — Y998 Other external cause status: Secondary | ICD-10-CM

## 2011-02-17 DIAGNOSIS — Z880 Allergy status to penicillin: Secondary | ICD-10-CM

## 2011-02-17 DIAGNOSIS — Z87440 Personal history of urinary (tract) infections: Secondary | ICD-10-CM

## 2011-02-17 DIAGNOSIS — G931 Anoxic brain damage, not elsewhere classified: Secondary | ICD-10-CM | POA: Diagnosis present

## 2011-02-17 DIAGNOSIS — E785 Hyperlipidemia, unspecified: Secondary | ICD-10-CM | POA: Diagnosis present

## 2011-02-17 DIAGNOSIS — I252 Old myocardial infarction: Secondary | ICD-10-CM

## 2011-02-17 DIAGNOSIS — G40909 Epilepsy, unspecified, not intractable, without status epilepticus: Secondary | ICD-10-CM | POA: Diagnosis present

## 2011-02-17 DIAGNOSIS — E0590 Thyrotoxicosis, unspecified without thyrotoxic crisis or storm: Secondary | ICD-10-CM | POA: Diagnosis present

## 2011-02-17 DIAGNOSIS — F341 Dysthymic disorder: Secondary | ICD-10-CM | POA: Diagnosis present

## 2011-02-17 DIAGNOSIS — Y9210 Unspecified residential institution as the place of occurrence of the external cause: Secondary | ICD-10-CM | POA: Diagnosis present

## 2011-02-17 HISTORY — DX: Primary adrenocortical insufficiency: E27.1

## 2011-02-17 LAB — DIFFERENTIAL
Basophils Absolute: 0 10*3/uL (ref 0.0–0.1)
Eosinophils Absolute: 0 10*3/uL (ref 0.0–0.7)
Eosinophils Relative: 0 % (ref 0–5)
Lymphocytes Relative: 3 % — ABNORMAL LOW (ref 12–46)
Lymphs Abs: 0.4 10*3/uL — ABNORMAL LOW (ref 0.7–4.0)
Monocytes Absolute: 0.8 10*3/uL (ref 0.1–1.0)

## 2011-02-17 LAB — CBC
MCHC: 31.1 g/dL (ref 30.0–36.0)
Platelets: 175 10*3/uL (ref 150–400)
RDW: 16.4 % — ABNORMAL HIGH (ref 11.5–15.5)
WBC: 13 10*3/uL — ABNORMAL HIGH (ref 4.0–10.5)

## 2011-02-17 LAB — HEPATIC FUNCTION PANEL
ALT: 13 U/L (ref 0–53)
Albumin: 4 g/dL (ref 3.5–5.2)
Alkaline Phosphatase: 97 U/L (ref 39–117)
Total Protein: 8 g/dL (ref 6.0–8.3)

## 2011-02-17 LAB — RAPID URINE DRUG SCREEN, HOSP PERFORMED
Cocaine: NOT DETECTED
Opiates: NOT DETECTED
Tetrahydrocannabinol: NOT DETECTED

## 2011-02-17 LAB — POCT I-STAT, CHEM 8
BUN: 26 mg/dL — ABNORMAL HIGH (ref 6–23)
Chloride: 114 mEq/L — ABNORMAL HIGH (ref 96–112)
Creatinine, Ser: 1 mg/dL (ref 0.4–1.5)
Hemoglobin: 12.9 g/dL — ABNORMAL LOW (ref 13.0–17.0)
Potassium: 4.5 mEq/L (ref 3.5–5.1)
Sodium: 137 mEq/L (ref 135–145)

## 2011-02-17 LAB — URINALYSIS, ROUTINE W REFLEX MICROSCOPIC
Nitrite: NEGATIVE
Specific Gravity, Urine: 1.025 (ref 1.005–1.030)
pH: 5.5 (ref 5.0–8.0)

## 2011-02-17 LAB — PROCALCITONIN: Procalcitonin: 0.84 ng/mL

## 2011-02-17 LAB — ETHANOL: Alcohol, Ethyl (B): <5 mg/dL (ref 0–10)

## 2011-02-17 LAB — LACTIC ACID, PLASMA: Lactic Acid, Venous: 1.8 mmol/L (ref 0.5–2.2)

## 2011-02-18 ENCOUNTER — Encounter (HOSPITAL_COMMUNITY): Payer: Self-pay | Admitting: Radiology

## 2011-02-18 ENCOUNTER — Inpatient Hospital Stay (HOSPITAL_COMMUNITY): Payer: Medicare Other

## 2011-02-18 LAB — BASIC METABOLIC PANEL
BUN: 22 mg/dL (ref 6–23)
CO2: 21 mEq/L (ref 19–32)
Chloride: 110 mEq/L (ref 96–112)
GFR calc Af Amer: >60 mL/min (ref >60)
Potassium: 3.8 mEq/L (ref 3.5–5.1)

## 2011-02-18 LAB — CORTISOL: Cortisol, Plasma: 12.6 ug/dL

## 2011-02-18 LAB — URINE CULTURE
Culture  Setup Time: 201202220148
Culture: NO GROWTH

## 2011-02-18 LAB — CBC
Hemoglobin: 10.8 g/dL — ABNORMAL LOW (ref 13.0–17.0)
MCV: 93.9 fL (ref 78.0–100.0)
Platelets: 139 10*3/uL — ABNORMAL LOW (ref 150–400)
RBC: 3.62 MIL/uL — ABNORMAL LOW (ref 4.22–5.81)
WBC: 6.8 10*3/uL (ref 4.0–10.5)

## 2011-02-18 LAB — STREP PNEUMONIAE URINARY ANTIGEN: Strep Pneumo Urinary Antigen: NEGATIVE

## 2011-02-18 LAB — CARDIAC PANEL(CRET KIN+CKTOT+MB+TROPI)
CK, MB: 0.8 ng/mL (ref 0.3–4.0)
Relative Index: INVALID (ref 0.0–2.5)
Total CK: 28 U/L (ref 7–232)
Troponin I: 0.01 ng/mL (ref 0.00–0.06)

## 2011-02-19 ENCOUNTER — Inpatient Hospital Stay (HOSPITAL_COMMUNITY): Payer: Medicare Other

## 2011-02-19 LAB — LEGIONELLA ANTIGEN, URINE

## 2011-02-19 LAB — COMPREHENSIVE METABOLIC PANEL
Albumin: 3 g/dL — ABNORMAL LOW (ref 3.5–5.2)
Alkaline Phosphatase: 69 U/L (ref 39–117)
BUN: 17 mg/dL (ref 6–23)
GFR calc Af Amer: >60 mL/min (ref >60)
Potassium: 3.5 mEq/L (ref 3.5–5.1)
Sodium: 136 mEq/L (ref 135–145)
Total Protein: 6 g/dL (ref 6.0–8.3)

## 2011-02-19 LAB — PHOSPHORUS: Phosphorus: 2.6 mg/dL (ref 2.3–4.6)

## 2011-02-19 LAB — CBC
Platelets: 125 10*3/uL — ABNORMAL LOW (ref 150–400)
RDW: 15.9 % — ABNORMAL HIGH (ref 11.5–15.5)
WBC: 3.7 10*3/uL — ABNORMAL LOW (ref 4.0–10.5)

## 2011-02-19 LAB — DIFFERENTIAL
Basophils Absolute: 0 10*3/uL (ref 0.0–0.1)
Basophils Relative: 0 % (ref 0–1)
Eosinophils Absolute: 0 10*3/uL (ref 0.0–0.7)
Eosinophils Relative: 1 % (ref 0–5)

## 2011-02-19 LAB — INFLUENZA VIRUS AG, A+B (DFA)

## 2011-02-19 LAB — TSH: TSH: 0.315 u[IU]/mL — ABNORMAL LOW (ref 0.350–4.500)

## 2011-02-19 LAB — T3, FREE: T3, Free: 2.2 pg/mL — ABNORMAL LOW (ref 2.3–4.2)

## 2011-02-19 NOTE — H&P (Signed)
NAME:  [name], [name]              ACCOUNT NO.:  [id number]  MEDICAL RECORD NO.:  [id number]           PATIENT TYPE:  E  LOCATION:  WLED                         FACILITY:  [facility]  PHYSICIAN:  Thad Ranger, MD       DATE OF BIRTH:  03/23/1966  DATE OF ADMISSION:  02/17/2011 DATE OF DISCHARGE:                             HISTORY & PHYSICAL   PRIMARY CARE PHYSICIAN:  [name], M.D.  CHIEF COMPLAINT:  Sent from the skilled nursing facility for hypoxia and vomiting.  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  The patient was recently discharged on February 02, 2011, from [facility].  The patient was sent from the skilled nursing facility for evaluation of hypoxemia after vomiting.  I am unable to obtain any history of the patient from the patient, given his history of anoxic brain injury.  He is alert and follows some simple commands, however, not able to give any history.  At the nursing facility, the patient was noted to have hypoxia with oxygen saturation of 79% on 2 L after vomiting and his blood pressure dropped to 88/60.  The patient is currently a resident of [facility].  He was sent to [facility] for further evaluation. Emergency room workup showed that the blood pressure stabilized to 109/73, however, the patient was still tachycardic at 110 with a temperature of 100.3.  PAST MEDICAL HISTORY: 1. History of recent admission for septic shock from aspiration     pneumonia, hemorrhagic shock secondary to a large groin hematoma in     February 2012. 2. History of paroxysmal atrial fibrillation, not on Coumadin     secondary to a large groin hematoma. 3. History  of cardiac arrest with anoxic brain injury. 4. Chronic dysphagia with PEG tube in place. 5. Hyperthyroidism. 6. History of recurrent UTIs. 7. Hyperlipidemia. 8. Anxiety/depression. 9. History of seizure disorders. 10.History of Addison disease.  ALLERGIES:  The patient is allergic to, 1. LATEX. 2. MORPHINE. 3. PENICILLIN. 4. PYRIDIUM.  MEDICATIONS PRIOR TO ADMISSION: 1. MiraLax via tube 17 g daily. 2. Metoclopramide 10 mg 4 times daily. 3. Lovaza 1 g via tube twice daily. 4. Nitrofurantoin 20 mL daily at bedtime. 5. Citalopram 40 mg daily at bedtime. 6. Certa-Vite liquid 15 mL q.a.m. 7. Buspirone 10 mg twice a day. 8. Isosource tube feeding 60 cc an hour. 9. Pantoprazole 40 mg p.o. b.i.d. 10.Norco 1 tablet every 4 hours as needed. 11.Lorazepam 0.5 mg 2 times daily as needed. 12.Tapazole 5 mg via tube 1 tablet twice daily. 13.Potassium 15 mL daily. 14.Artificial tears 1 drop 4 times daily.  PHYSICAL EXAMINATION:  VITAL SIGNS:  BP 109/73, pulse rate 110, respirations 20, temperature 100.3. GENERAL:  The patient is alert and awake, however, unable to interact due to history of anoxic brain injury. HEENT:  Normocephalic, atraumatic.  Slightly dry mucosal membranes.  CVS:  Tachycardic, S1 and S2. LUNGS:  Appear to be clear to auscultation bilaterally.  Decreased breath sounds at the bases. ABDOMEN:  Soft.  Multiple previous scars from the previous surgeries. Nontender.  Ecchymosis on the right chest wall from the recent history of hematoma. EXTREMITIES:  No cyanosis, clubbing, or edema noted.  DIAGNOSTIC DATA:  Lactic acid 1.8.  Procalcitonin 0.84.  CBC, white count 13.0, hemoglobin 12.8, hematocrit 41.2, platelets 175. Neutrophils 91%.  INR 1.16.  Urine drug screen negative.  UA negative. Alcohol less than 5.  Hepatic function, total bili 1.8, direct bili 0.4, indirect bili 1.4, LFTs otherwise normal.  Ammonia level 42.  Sodium 137, potassium 4.5, BUN 26, creatinine  1.0.  RADIOLOGICAL DATA:  Chest x-ray showed interval improvement in the right basilar aeration with chronic elevation of right hemidiaphragm.  No acute findings.  IMPRESSION AND PLAN:  [name] is a 45 year old male with a history of anoxic brain injury and encephalopathy, presenting to the emergency room with hypoxia after vomiting with hypotension.  Initial white count 13.0. 1. Hypoxia with emesis.  We will admit the patient and place him on     the telemonitored floor.  Possibly, the patient has aspiration     pneumonitis leading to hypoxia.  Also, rule out influenza.  The     patient will be placed on vancomycin and Levaquin for the broad-     spectrum coverage and obtain blood cultures and urine cultures.     Continue the patient on IV Reglan. 2. Dysphagia with recurrent aspiration.  For now, we will place the     patient on n.p.o. status and on IV fluids.  Obtain CT of the     abdomen and pelvis to rule out any obstruction/ileus.  Also, the     patient had a recent large hematoma.  We will assess if the     hematoma has resolved. 3. History of paroxysmal atrial fibrillation, currently rate     controlled. 4. History of hyperthyroidism.  Recheck TSH and continue the patient     on Tapazole. 5. History of anoxic brain injury with dysphagia, status post PEG.  We     will obtain nutrition consult regarding the tube feeding as the     patient appears to have recurrent episodes of aspiration     pneumonitis. 6. Prophylaxis.  Bilateral SCDs.  No Lovenox secondary to recent     history of hematoma.     Thad Ranger, MD     RR/MEDQ  D:  02/17/2011  T:  02/18/2011  Job:  [id number]  cc:   [name], M.D.  Electronically Signed by Andres Labrum Era Parr  on 02/19/2011 12:59:44 PM

## 2011-02-20 ENCOUNTER — Inpatient Hospital Stay (HOSPITAL_COMMUNITY): Payer: Medicare Other

## 2011-02-20 LAB — BASIC METABOLIC PANEL
CO2: 26 mEq/L (ref 19–32)
Calcium: 8.5 mg/dL (ref 8.4–10.5)
GFR calc Af Amer: >60 mL/min (ref >60)
Glucose, Bld: 89 mg/dL (ref 70–99)
Potassium: 3.7 mEq/L (ref 3.5–5.1)
Sodium: 139 mEq/L (ref 135–145)

## 2011-02-20 LAB — DIFFERENTIAL
Basophils Absolute: 0 10*3/uL (ref 0.0–0.1)
Basophils Relative: 0 % (ref 0–1)
Lymphocytes Relative: 36 % (ref 12–46)
Monocytes Absolute: 0.5 10*3/uL (ref 0.1–1.0)
Neutro Abs: 1.5 10*3/uL — ABNORMAL LOW (ref 1.7–7.7)
Neutrophils Relative %: 46 % (ref 43–77)

## 2011-02-20 LAB — PTH, INTACT AND CALCIUM
Calcium, Total (PTH): 8.7 mg/dL (ref 8.4–10.5)
PTH: 67.9 pg/mL (ref 14.0–72.0)

## 2011-02-20 LAB — CBC
HCT: 32.6 % — ABNORMAL LOW (ref 39.0–52.0)
Hemoglobin: 10.3 g/dL — ABNORMAL LOW (ref 13.0–17.0)
MCHC: 31.6 g/dL (ref 30.0–36.0)
RBC: 3.49 MIL/uL — ABNORMAL LOW (ref 4.22–5.81)
WBC: 3.3 10*3/uL — ABNORMAL LOW (ref 4.0–10.5)

## 2011-02-20 LAB — VITAMIN D 25 HYDROXY (VIT D DEFICIENCY, FRACTURES): Vit D, 25-Hydroxy: 29 ng/mL — ABNORMAL LOW (ref 30–89)

## 2011-02-22 NOTE — Discharge Summary (Signed)
NAMEGRACEN, [name]              ACCOUNT NO.:  [id number]  MEDICAL RECORD NO.:  [id number]           PATIENT TYPE:  I  LOCATION:  1417                         FACILITY:  [facility]  PHYSICIAN:  Rock Nephew, MD       DATE OF BIRTH:  [birth year]  DATE OF ADMISSION:  02/17/2011 DATE OF DISCHARGE:  02/20/2011                              DISCHARGE SUMMARY   PRIMARY CARE PHYSICIAN:  [name], M.D.  DISCHARGE DIAGNOSES: 1. Hypoxemia, most likely secondary to aspiration. 2. Dysphagia, on dysphagia III diet, nectar-thick liquids. 3. History paroxysmal atrial fibrillation, started on aspirin. 4. History of hyperthyroidism, on Tapazole. 5. History of anoxic brain injury. 6. Hypocalcemia.  DISCHARGE MEDICATIONS: 1. Artificial Tears 1 drop in both eyes 4 times a day. 2. Aspirin 325 mg p.o. daily. 3. Citalopram 20 mg p.o. daily. 4. Levaquin 500 mg by mouth daily for 7 days. 5. Multivitamins 15 mL in tube daily. 6. Buspirone 10 mg p.o. twice daily. 7. Thera-Vite Liquid 15 mL in tube every morning. 8. Isosorbide 60 mL an hour. 9. Lorazepam 0.5 mg 1 tablet in tube 3 times a day. 10.Lovaza 1 capsule in tube twice daily. 11.Reglan 10 mg 1 tablet in tube 4 times a day. 12.MiraLax 17 g p.o. daily. 13.Norco 1 tablet p.o. q.4h. p.r.n. pain. 14.Pantoprazole 40 mg in tube twice daily. 15.Potassium chloride liquid 15 mL in tube daily. 16.Tapazole 5 mg 1 tablet p.o. daily. 17.Calcium carbonate 1 g p.o. b.i.d. for 30 days.  DISPOSITION:  The patient is discharged to skilled nursing facility.  PROCEDURES PERFORMED:  The patient had chest x-ray initially which showed interval improvement, right basilar aeration, and chronic elevation on the right hemidiaphragm, no acute findings.  CT scan of the abdomen and pelvis on February 18, 2011, showed no evidence obstruction, retained contrast in the colon, patchy tree-in-bud opacity in the bilateral lower lobes, incompletely visualized, likely  infectious and/or on the basis of aspiration.  Near complete resolution of right lateral abdominal wall hematoma.  Prior right thigh hematoma is not visualized and may have resolved.  Also, the patient had a PICC line placement.  CONSULTATIONS:  Speech Therapy.  The patient had a speech therapy evaluation, and with the modified barium swallow, Speech Therapy recommended that the patient be placed on dysphagia III solids nectar- thick liquids and just tube feeds accordingly.  If p.o. intake is adequate, consider discontinuing the PEG.  The patient also had a PICC line placement.  FOLLOWUP:  Follow up with Dr. Baltazar Najjar in 1 week.  Also, the head of the bed should be raised at all times as possible to prevent aspiration.  The patient should also have thyroid function test done in 5 weeks.  Inpatient psych meds should be decreased to decrease the somnolence if possible.  BRIEF HISTORY OF PRESENT ILLNESS:  This is a 45 year old male with history multiple medical problems including anoxic brain injury secondary to cardiac arrest, history of hyperthyroidism, chronic dysphagia with PEG tube, recent history of septic shock hemorrhagic shock secondary to large groin hematoma.  Was recently discharge on February 02, 2011.  The patient was  sent from the nursing facility for evaluation of hypoxemia.  HOSPITAL COURSE: 1. Hypoxemia with emesis.  The patient had hypoxemia and emesis.  This     is most likely secondary to aspiration.  The patient was placed on     Avelox.  The patient had on the abdominal CT scan the lung windows     did have some tree-in-bud appearance possible with aspiration.  The     patient improved with Levaquin.  The patient had no desaturations.     He maintained his O2 sats on room air.  The patient had speech     evaluation, and they again recommend dysphagia III diet. 2. Dysphagia with recurrent aspiration.  Again, the patient had a     modified barium swallow,  was seen by Speech Therapy, they     recommended a dysphagia III diet with nectar-thick liquids which     should be continued in the skilled nursing facility. 3. History paroxysmal AFib.  On the patient's history, it is noted the     patient has had history of paroxysmal AFib.  He is not on Coumadin     secondary large groin hematoma.  The patient's hematoma has     resolved.  I have started the patient on aspirin 325 mg p.o. daily.     Please observe for any bleeding complications. 4. History of hyperthyroidism.  The patient had thyroid function tests     done during the hospitalization.  The patient's thyroid function     tests showed a TSH of 0.315.  The patient's free T4 was normal     0.84.  The patient's free T3 was low at 2.2.  The patient should     have repeat thyroid function test done in 5 weeks. 5. DVT prophylaxis.  The patient received SCDs. 6. Hypocalcemia.  The patient had an ionized calcium that was low at     0.85.  The patient's 25-hydroxy vitamin D level was just slightly     depressed at 29, normal range is from 30-89.  I will send the     patient home on calcium carbonate 2 g daily or 1 gram p.o. b.i.d.     for at least 30 days.  The patient's ionized calcium level should     be checked again in a month.     Rock Nephew, MD     NH/MEDQ  D:  02/20/2011  T:  02/20/2011  Job:  [id number]  cc:   [name], M.D.  Electronically Signed by Rock Nephew MD on 02/22/2011 03:04:13 PM

## 2011-02-23 LAB — INFLUENZA A H1N1
Influenza A RNA: NOT DETECTED — AB
Swine Influenza H1 Gene: NOT DETECTED — AB

## 2011-02-24 LAB — CULTURE, BLOOD (ROUTINE X 2)
Culture  Setup Time: 201202220151
Culture: NO GROWTH

## 2011-02-27 IMAGING — CR DG CHEST 1V PORT
2 series · 2 of 2 positions shown · non-contrast
Comparison: 10/14/2010

CLINICAL DATA: Chest pain

PORTABLE CHEST - 1 VIEW

[AP (1 of 2)]
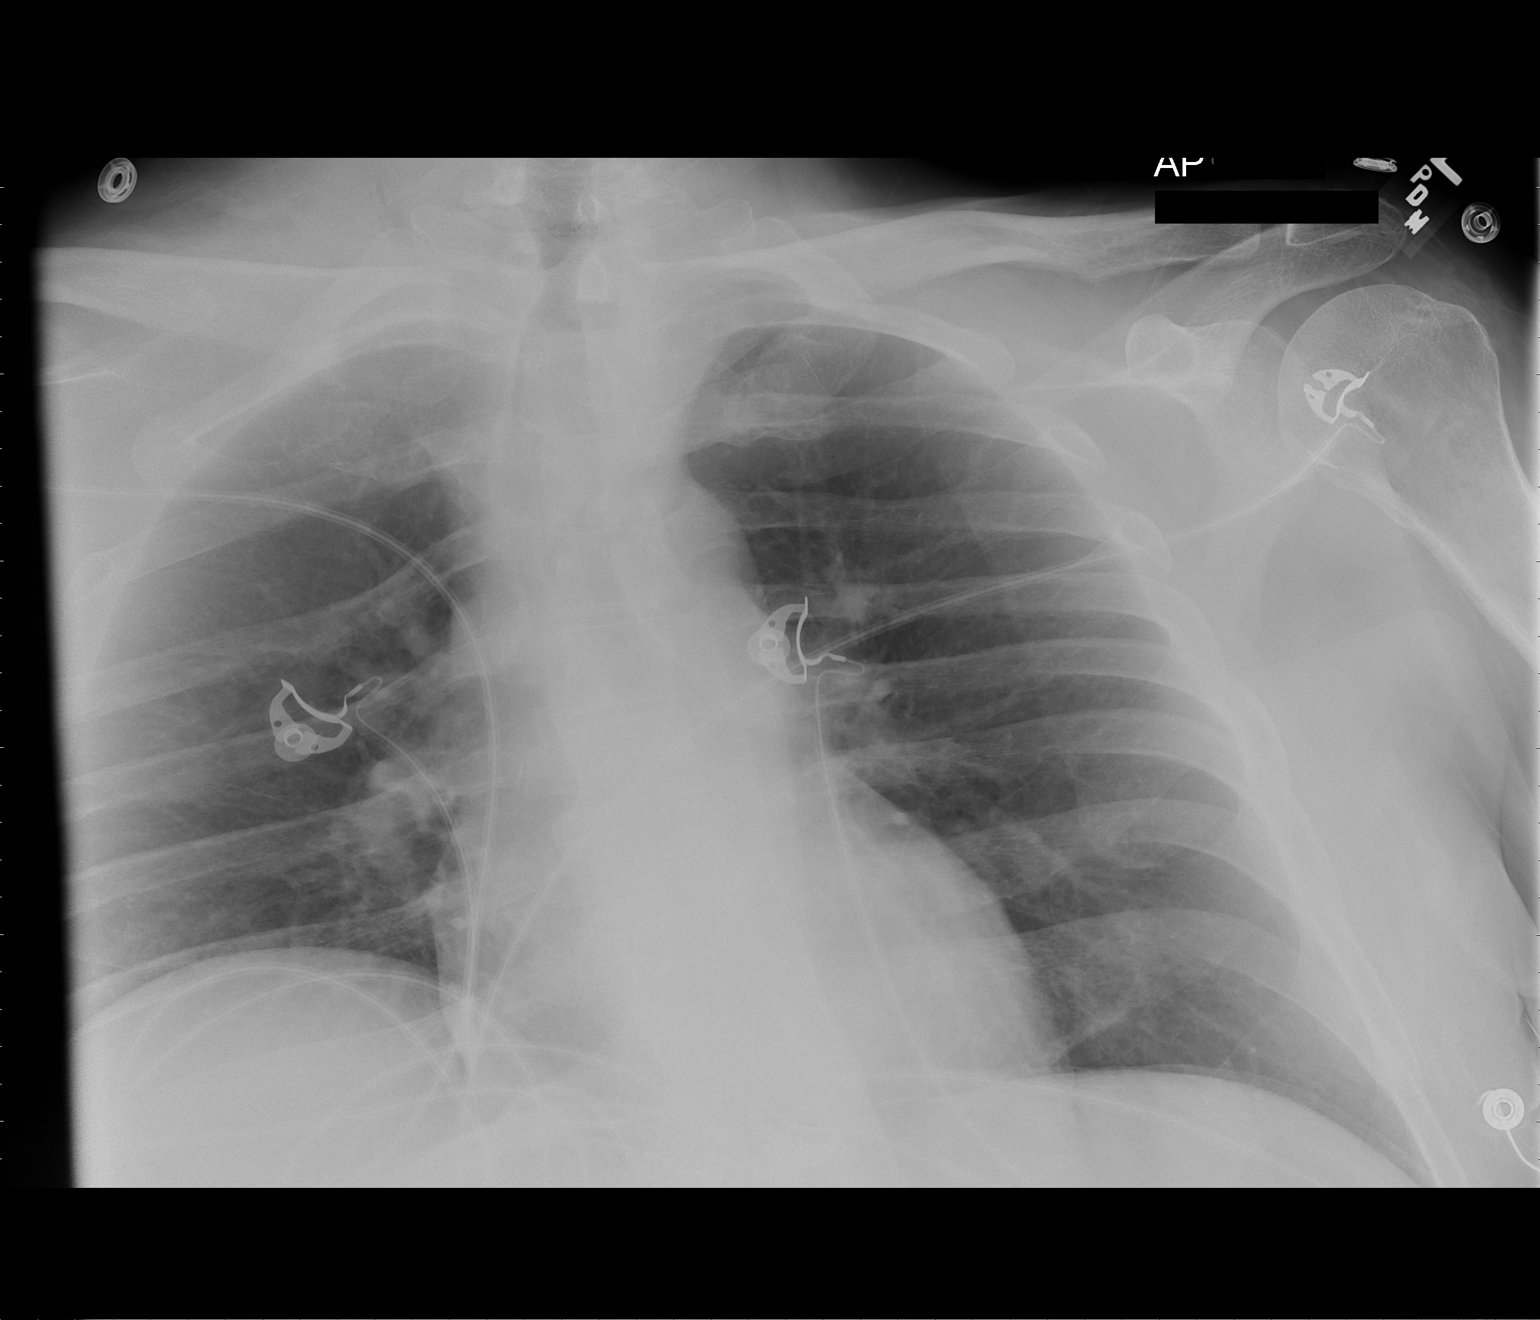

[AP (2 of 2)]
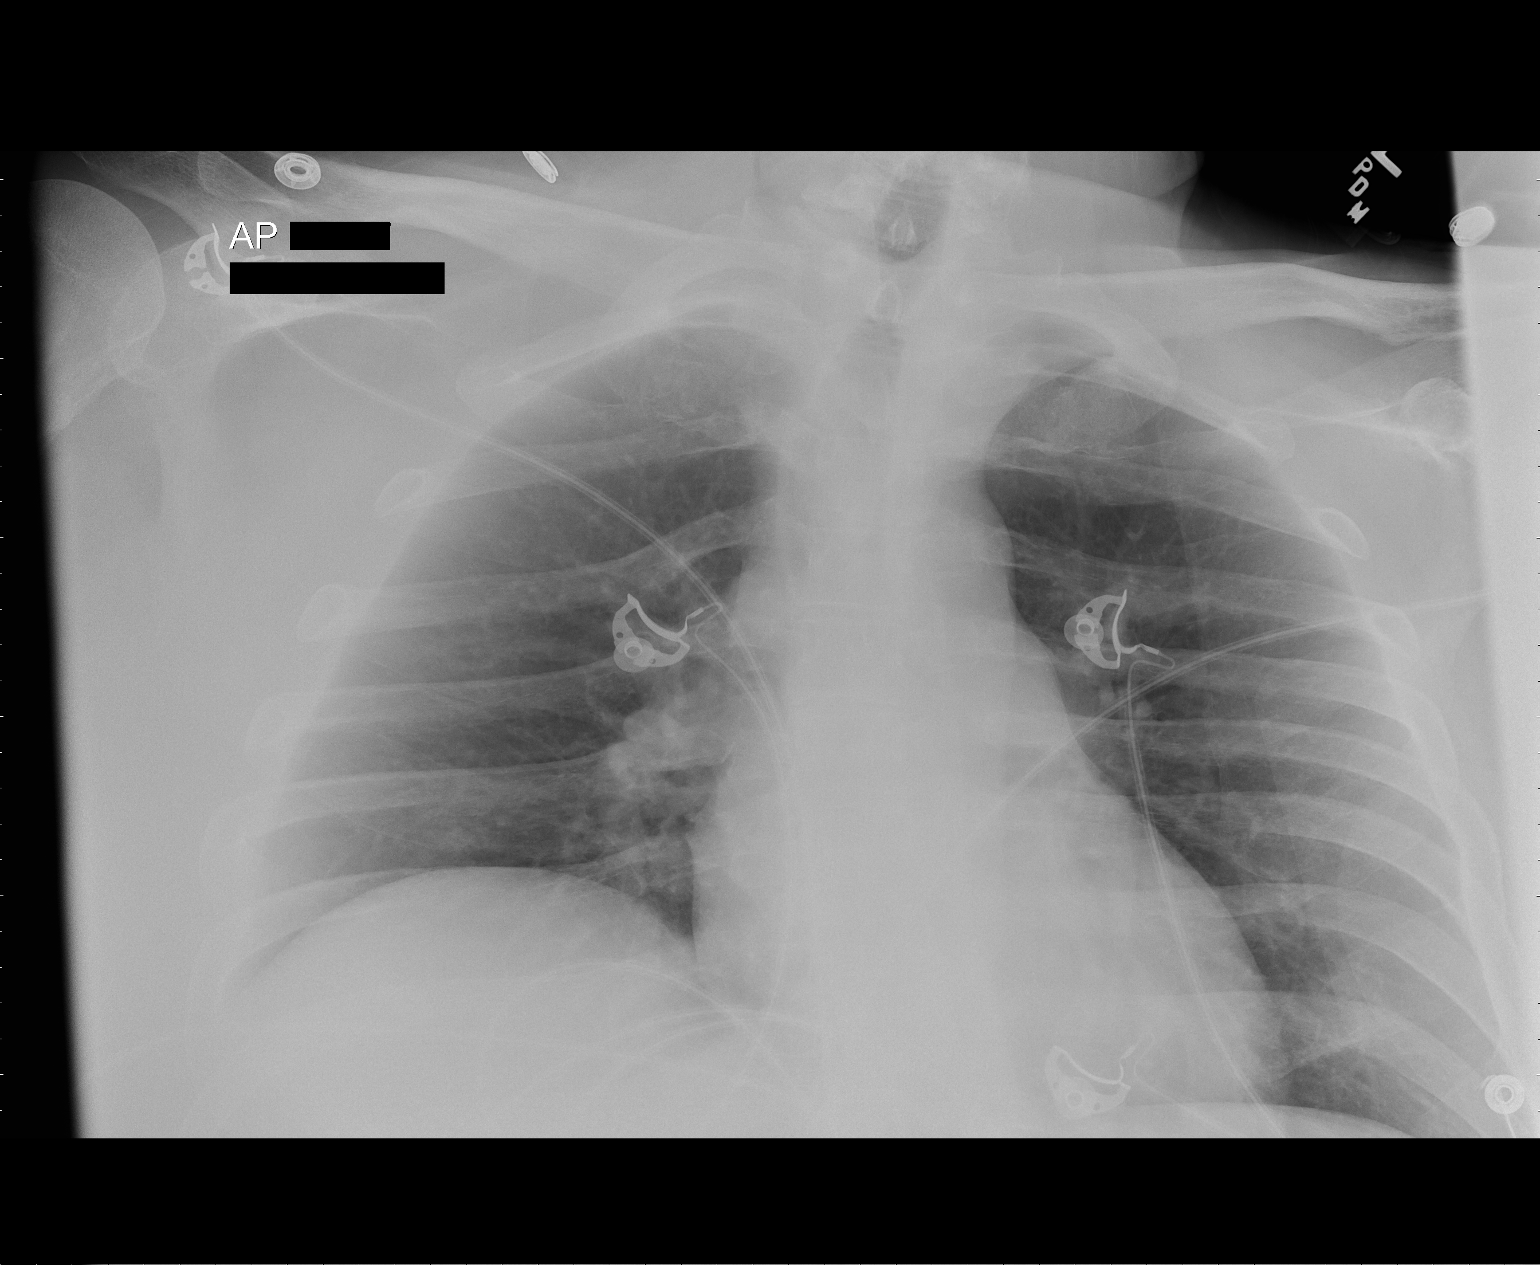

[2 of 2 positions shown; findings below may reference images not displayed]

FINDINGS: Normal heart size.  Clear lungs.  No pneumothorax.
IMPRESSION: No active cardiopulmonary disease.

## 2011-02-28 IMAGING — CR DG CHEST 1V PORT
1 series · 1 of 1 positions shown · non-contrast
Comparison: Portable exam 1551 hours compared to 01/25/2011

CLINICAL DATA: Respiratory distress

PORTABLE CHEST - 1 VIEW

[AP]
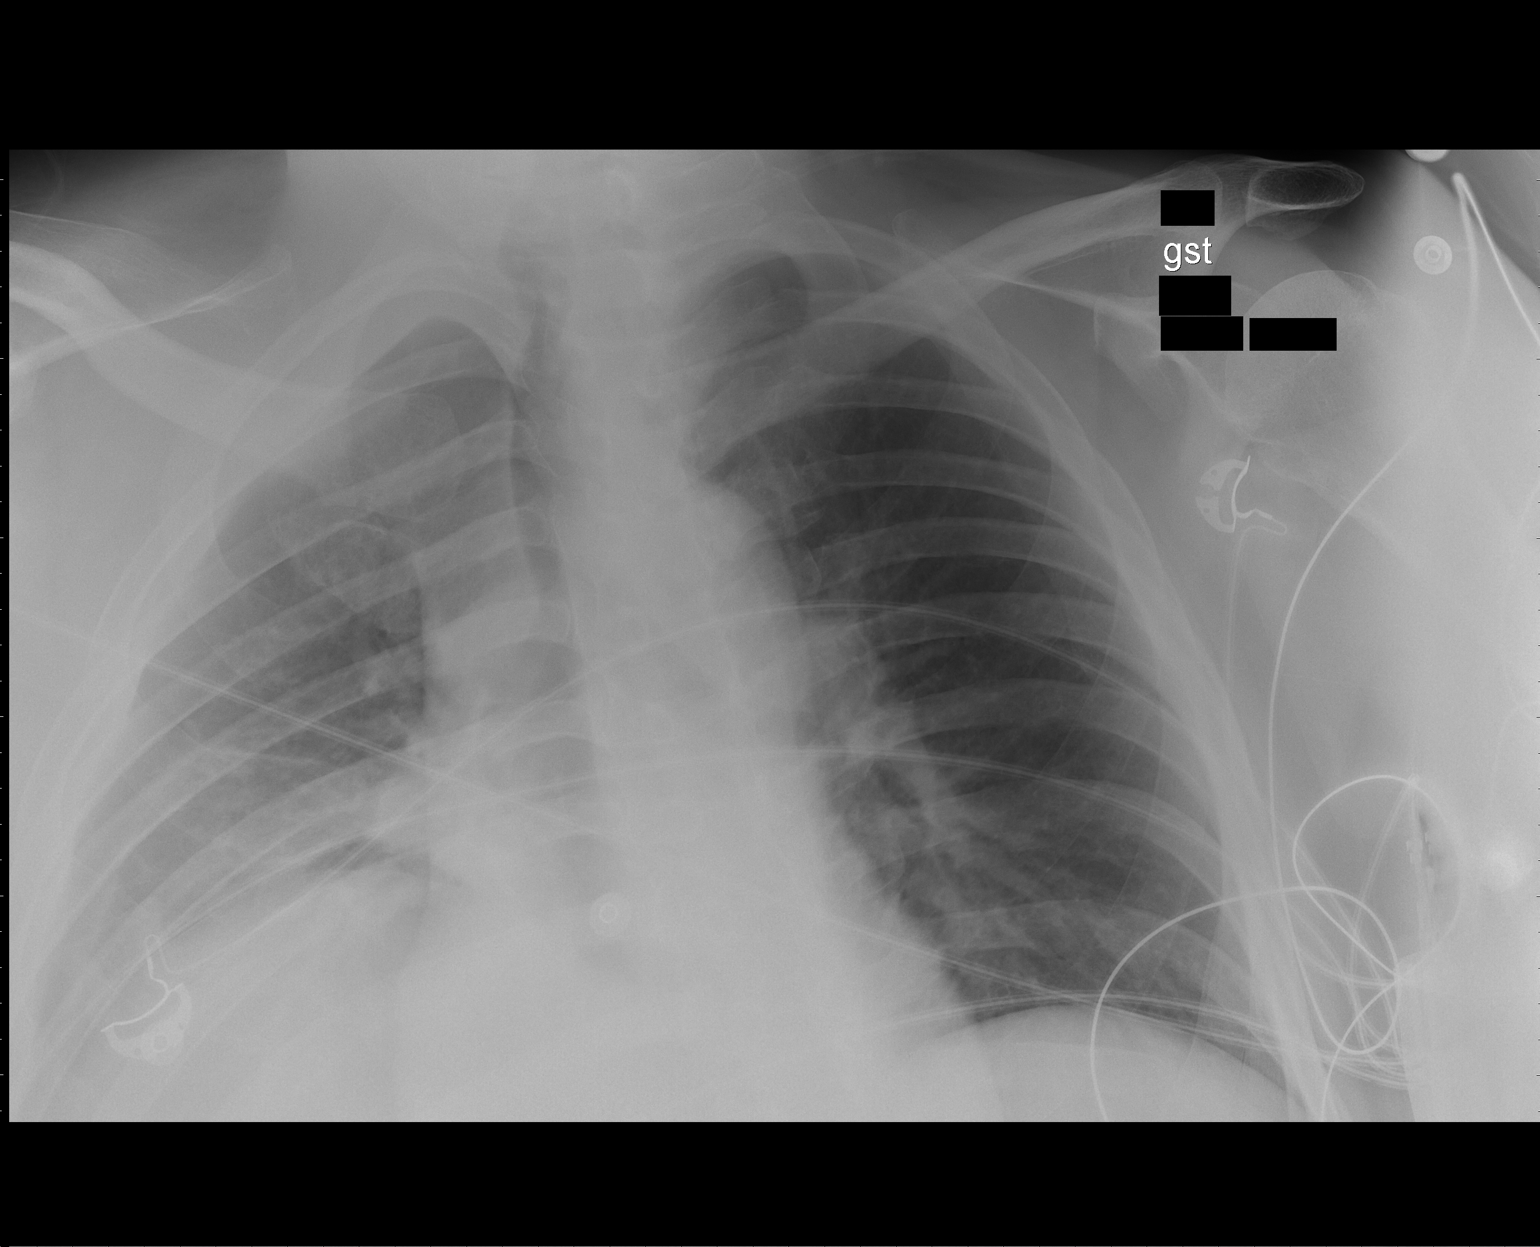

[1 of 1 positions shown; findings below may reference images not displayed]

FINDINGS: Rotated to the right.
Elevation right diaphragm.
Low lung volumes.
Normal heart size, mediastinal contours, and pulmonary vascularity.
Right basilar atelectasis versus developing infiltrate.
Left lung clear.
Osseous structures unremarkable.
IMPRESSION: Developing right basilar infiltrate versus atelectasis.

## 2011-02-28 IMAGING — CT CT ABD-PELV W/O CM
1 of 4 series · 13 of 32 positions shown, 18 images · non-contrast
Comparison: CT abdomen and pelvis 08/10/2010 and 01/26/2010.

CLINICAL DATA: Unusual.  Shock.  Evaluate for retroperitoneal
bleed.

CT ABDOMEN AND PELVIS WITHOUT CONTRAST 01/26/2011:
TECHNIQUE: Multidetector CT imaging of the abdomen and pelvis was
performed following the standard protocol without intravenous
contrast.

[Series 2: abd/pelv w/o 5.0 b31f st · axial · non-contrast · 0.76mm/px · z∈[-448,-8]mm · 13 of 102 slices shown, 18 images]
[im 7/102  soft-tissue]
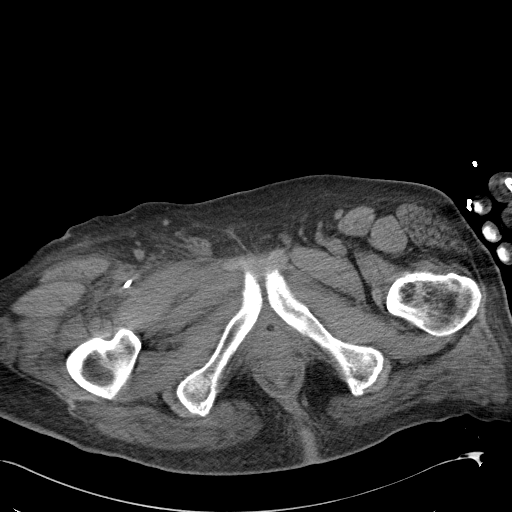
[im 7/102  bone]
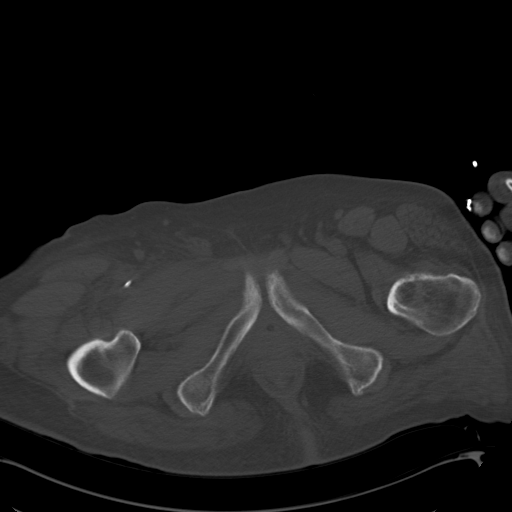
[im 13/102  soft-tissue]
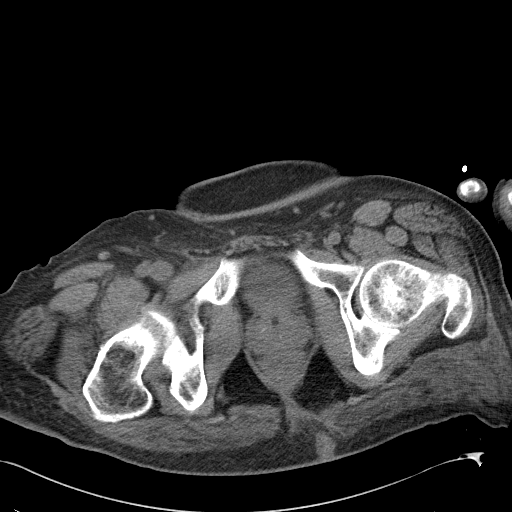
[im 26/102  soft-tissue]
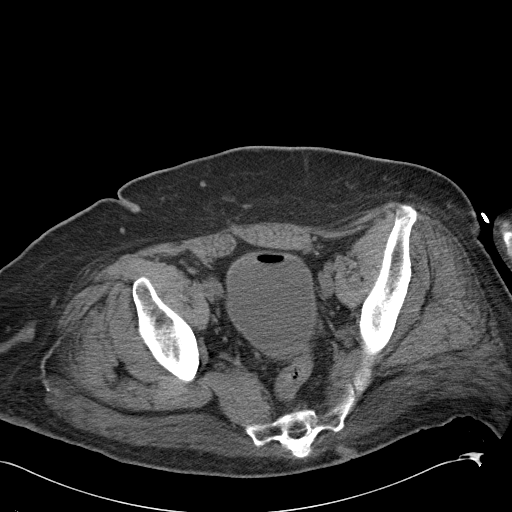
[im 32/102  soft-tissue]
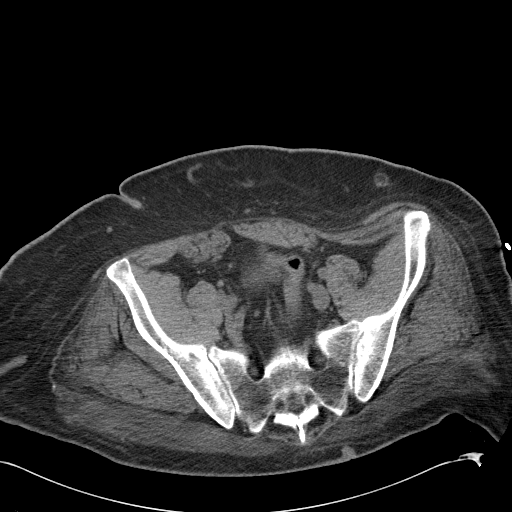
[im 38/102  soft-tissue]
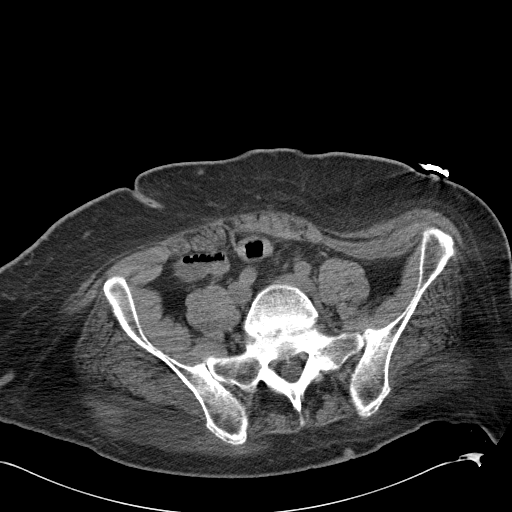
[im 45/102  soft-tissue]
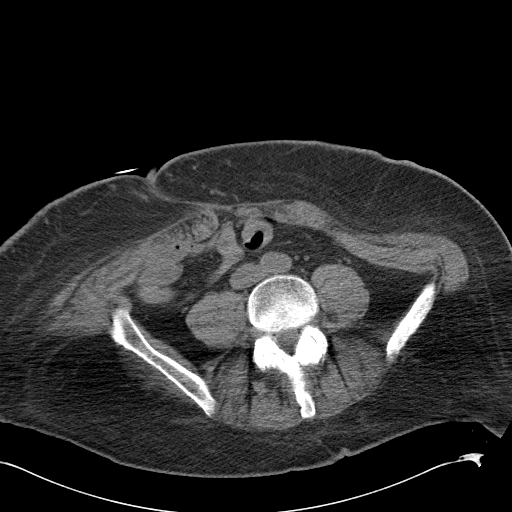
[im 57/102  soft-tissue]
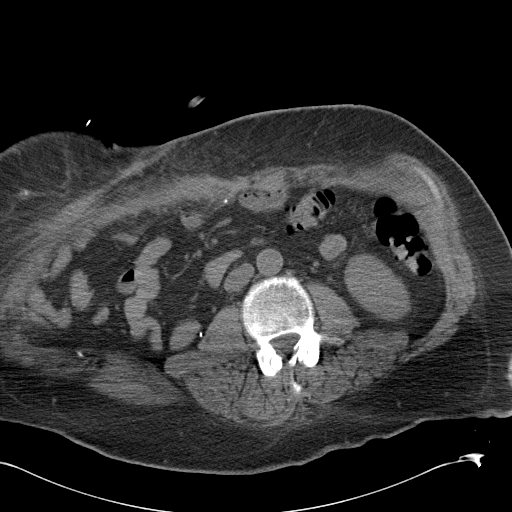
[im 64/102  soft-tissue]
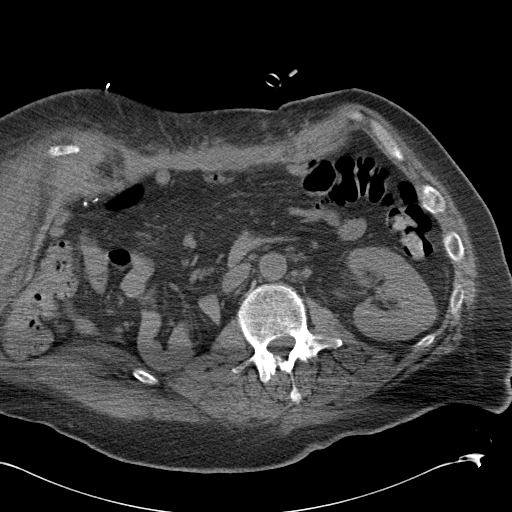
[im 70/102  soft-tissue]
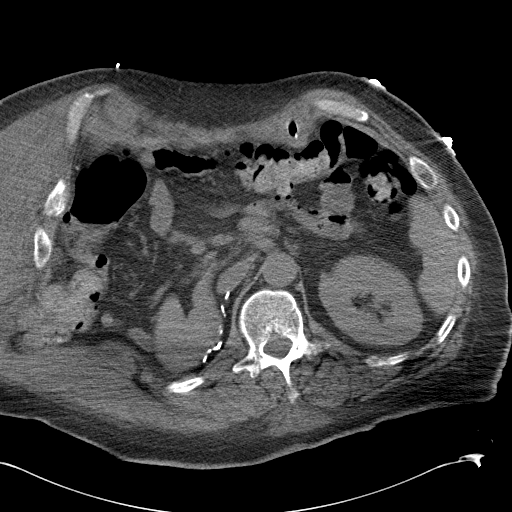
[im 70/102  bone]
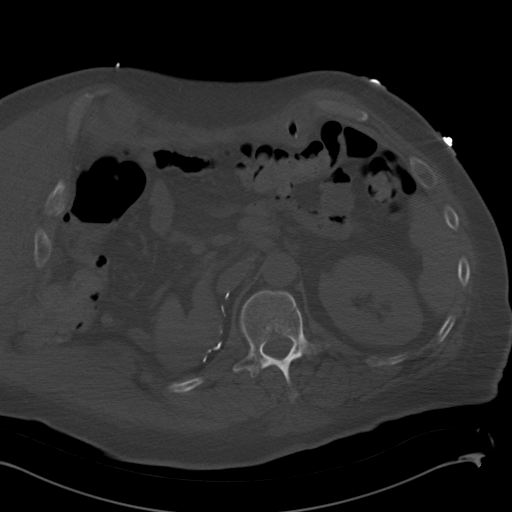
[im 76/102  soft-tissue]
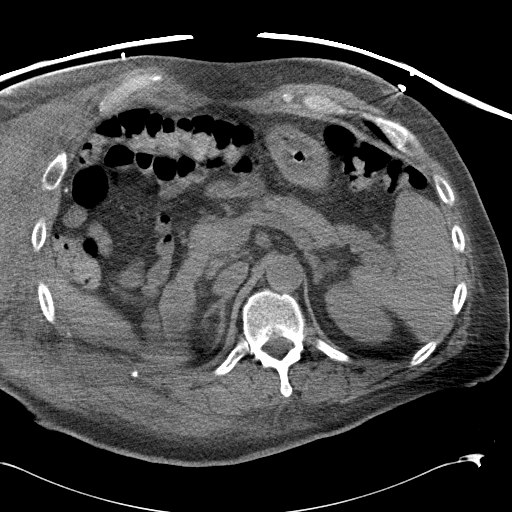
[im 76/102  lung]
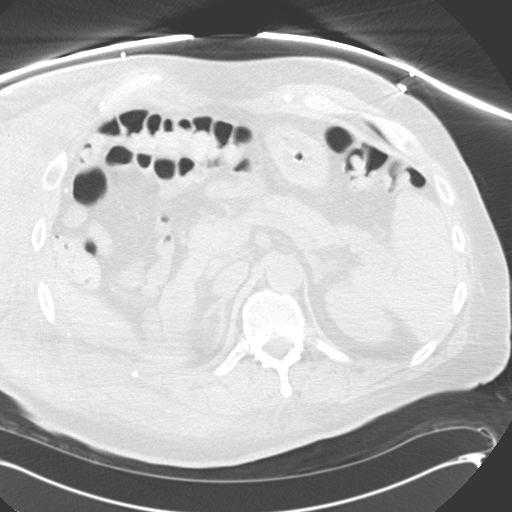
[im 83/102  lung]
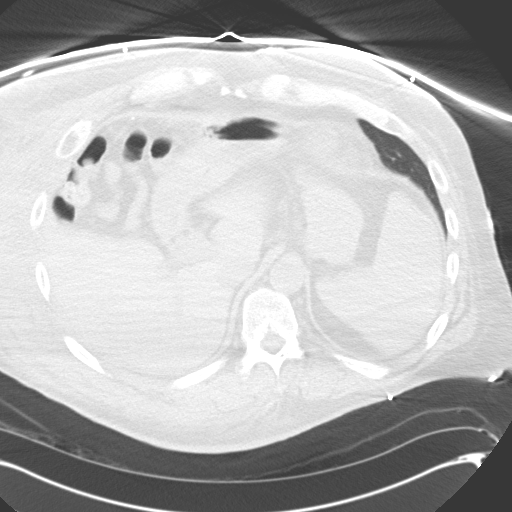
[im 89/102  soft-tissue]
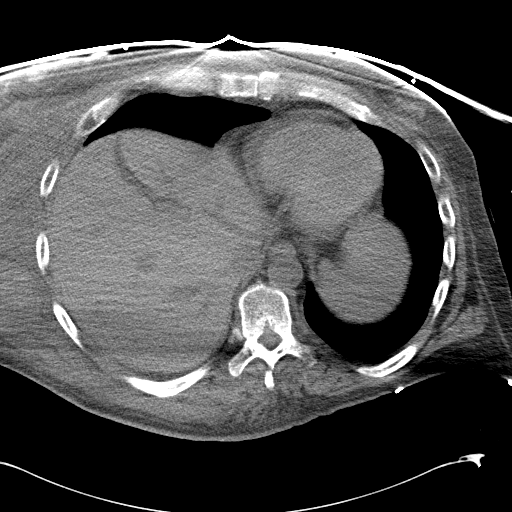
[im 89/102  lung]
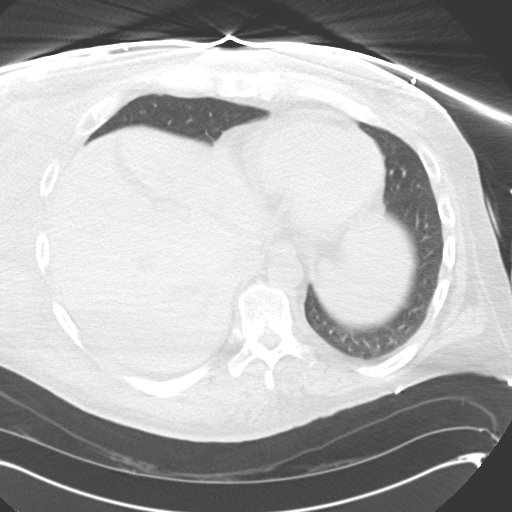
[im 95/102  soft-tissue]
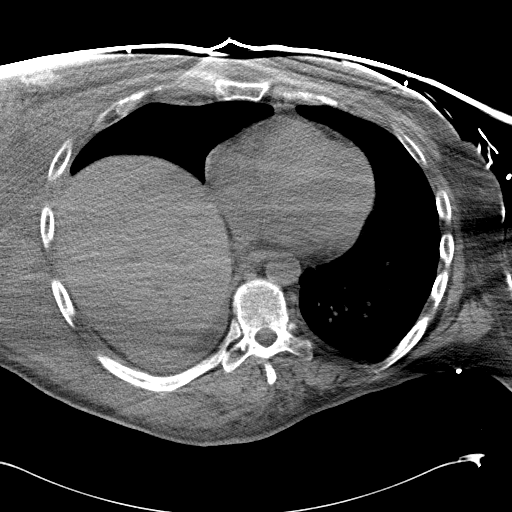
[im 95/102  lung]
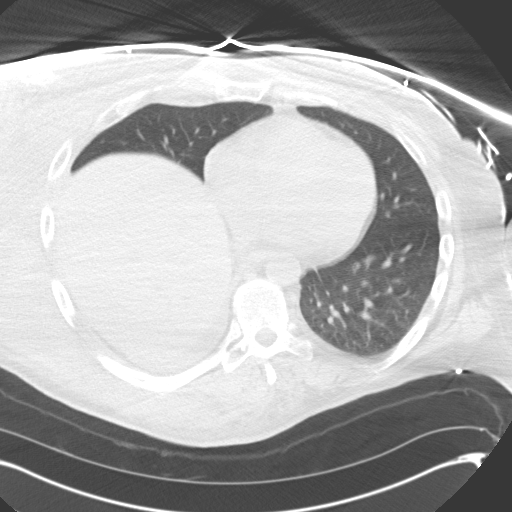

[13 of 32 positions shown; findings below may reference images not displayed]

FINDINGS: Right femoral central venous catheter with its tip but
barely in the right common femoral vein.  Large hematoma involving
the lateral and oblique abdominal muscles of the right side of the
abdomen, extending upward into the thorax.  Large hematoma also
present within the abductor muscles of the right side medially.  No
evidence of retroperitoneal or intraperitoneal hemorrhage or
hematoma.

Elevation of the right hemidiaphragm.  Normal unenhanced appearance
of the liver, spleen, pancreas, adrenal glands, and left kidney.
Right kidney surgically absent.  No recurrent tumor in the right
nephrectomy bed.  Gallbladder surgically absent, with presumed
small retained stones at the cholecystectomy bed, unchanged.  No
biliary ductal dilation.  Stomach decompressed with a gastrostomy
tube appropriately positioned in the proximal body.  Small bowel
and colon unremarkable.  No ascites.

Urinary bladder unremarkable, containing a Foley catheter which
accounts for the bladder gas.  Prostate gland and seminal vesicles
normal for age.  Bone window images demonstrate bilateral L5 pars
defects with grade 1 spondylolisthesis of L5 on S1 approximating 10
mm, with degenerative disc disease at this level and facet
degenerative changes diffusely.  Visualized lung bases demonstrate
a small right pleural effusion and associated passive atelectasis
in the right lower lobe.
IMPRESSION: 1.  Large hematoma involving the lateral and oblique abdominal
musculature on the right, extending upward into the thorax.
2.  Second large hematoma involving the abductor muscles of the
medial right thigh.
3.  No evidence of retroperitoneal or intraperitoneal hemorrhage or
hematoma.
4.  Right femoral central venous catheter tip barely into the right
common femoral vein.
5.  Small right pleural effusion and associated passive atelectasis
in the right lower lobe.

## 2011-02-28 IMAGING — CR DG CHEST 1V PORT
1 series · 1 of 1 positions shown · non-contrast
Comparison: Portable chest x-ray earlier same date 4884 hours.

CLINICAL DATA: Central venous catheter placement.

PORTABLE CHEST - 1 VIEW [DATE]/9249 4778 hours:

[view not recorded]
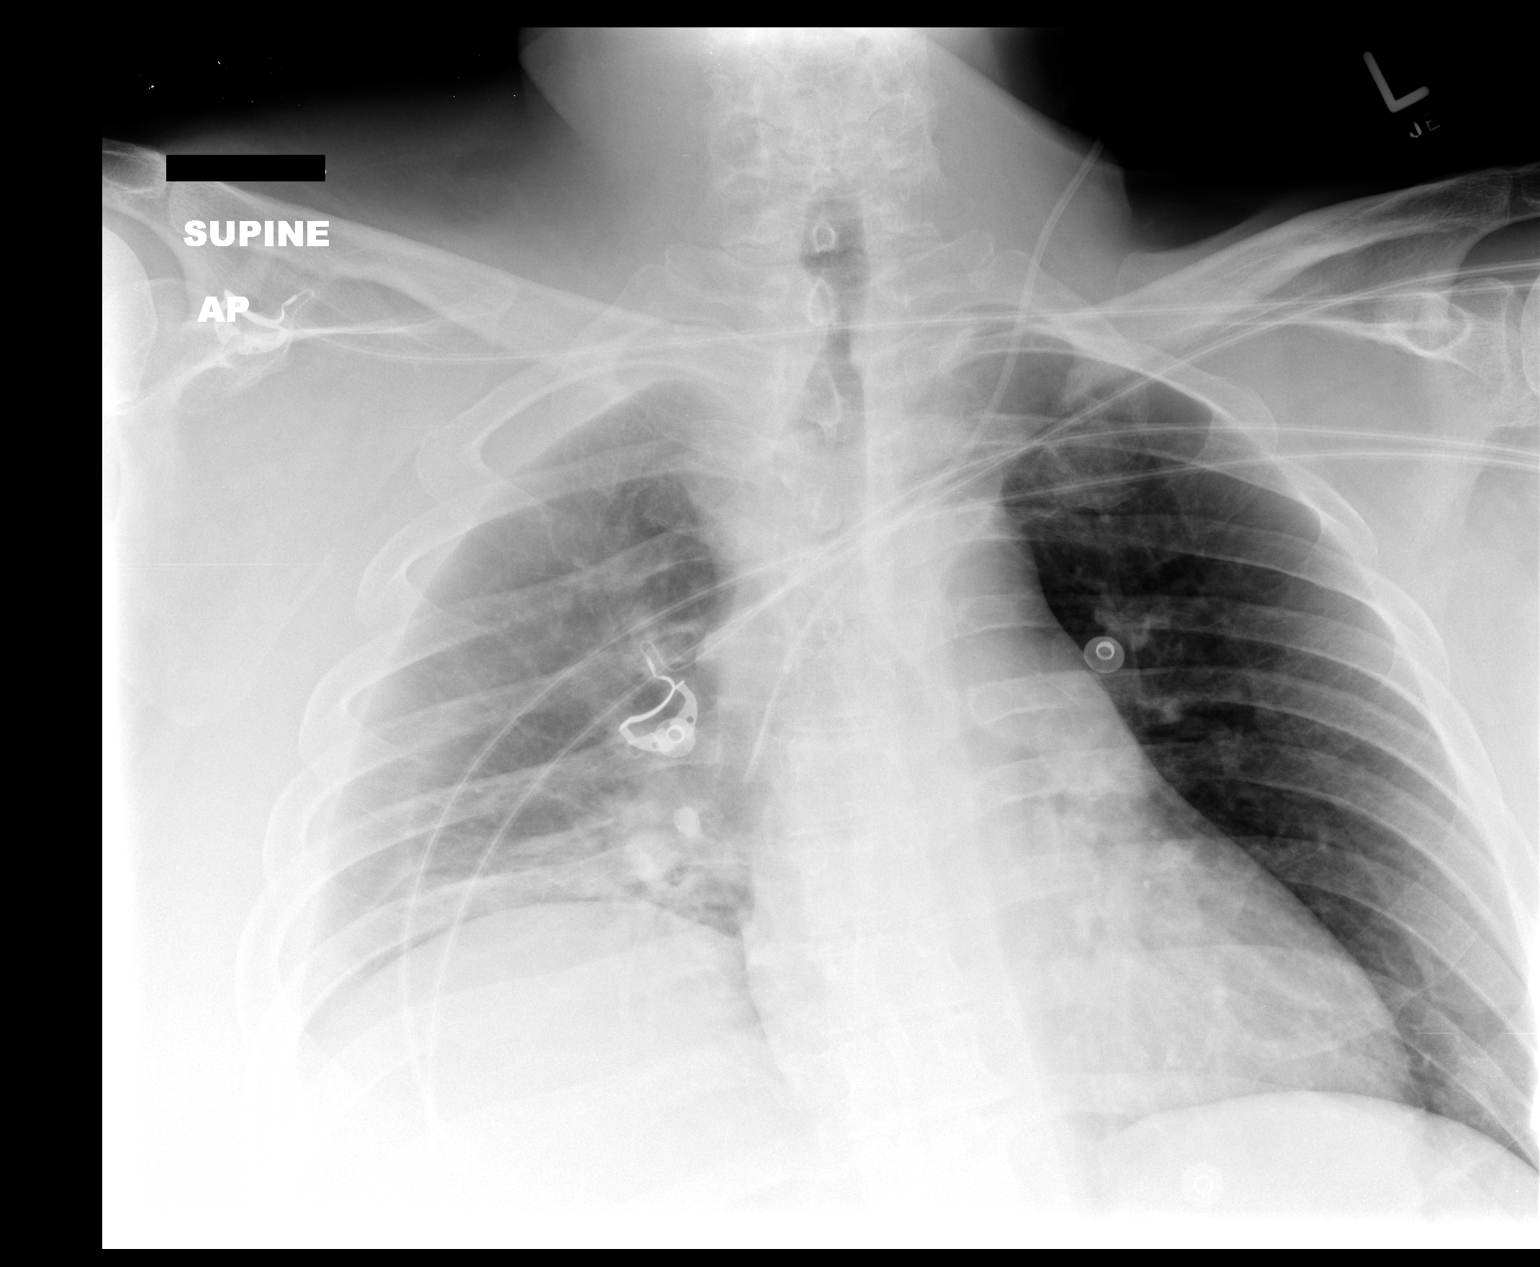

[1 of 1 positions shown; findings below may reference images not displayed]

FINDINGS: Left jugular central venous catheter tip in upper SVC.
No evidence of pneumothorax or mediastinal hematoma.  Elevation of
the right hemidiaphragm and consolidation in the right lung base,
with slight improved aeration since earlier today.  Left lung
remains clear.  Cardiac silhouette upper normal in size for
technique.
IMPRESSION: 1.  Left jugular central venous catheter tip in the SVC.  No acute
complicating features.
2.  Improved aeration in the right lung base since earlier in the
day with mild atelectasis and/or pneumonia persisting.  No new
abnormalities.

## 2011-03-01 IMAGING — CR DG CHEST 1V PORT
1 series · 1 of 1 positions shown · non-contrast
Comparison: 01/26/2011

CLINICAL DATA: Shock with presumed sepsis.

PORTABLE CHEST - 1 VIEW

[AP]
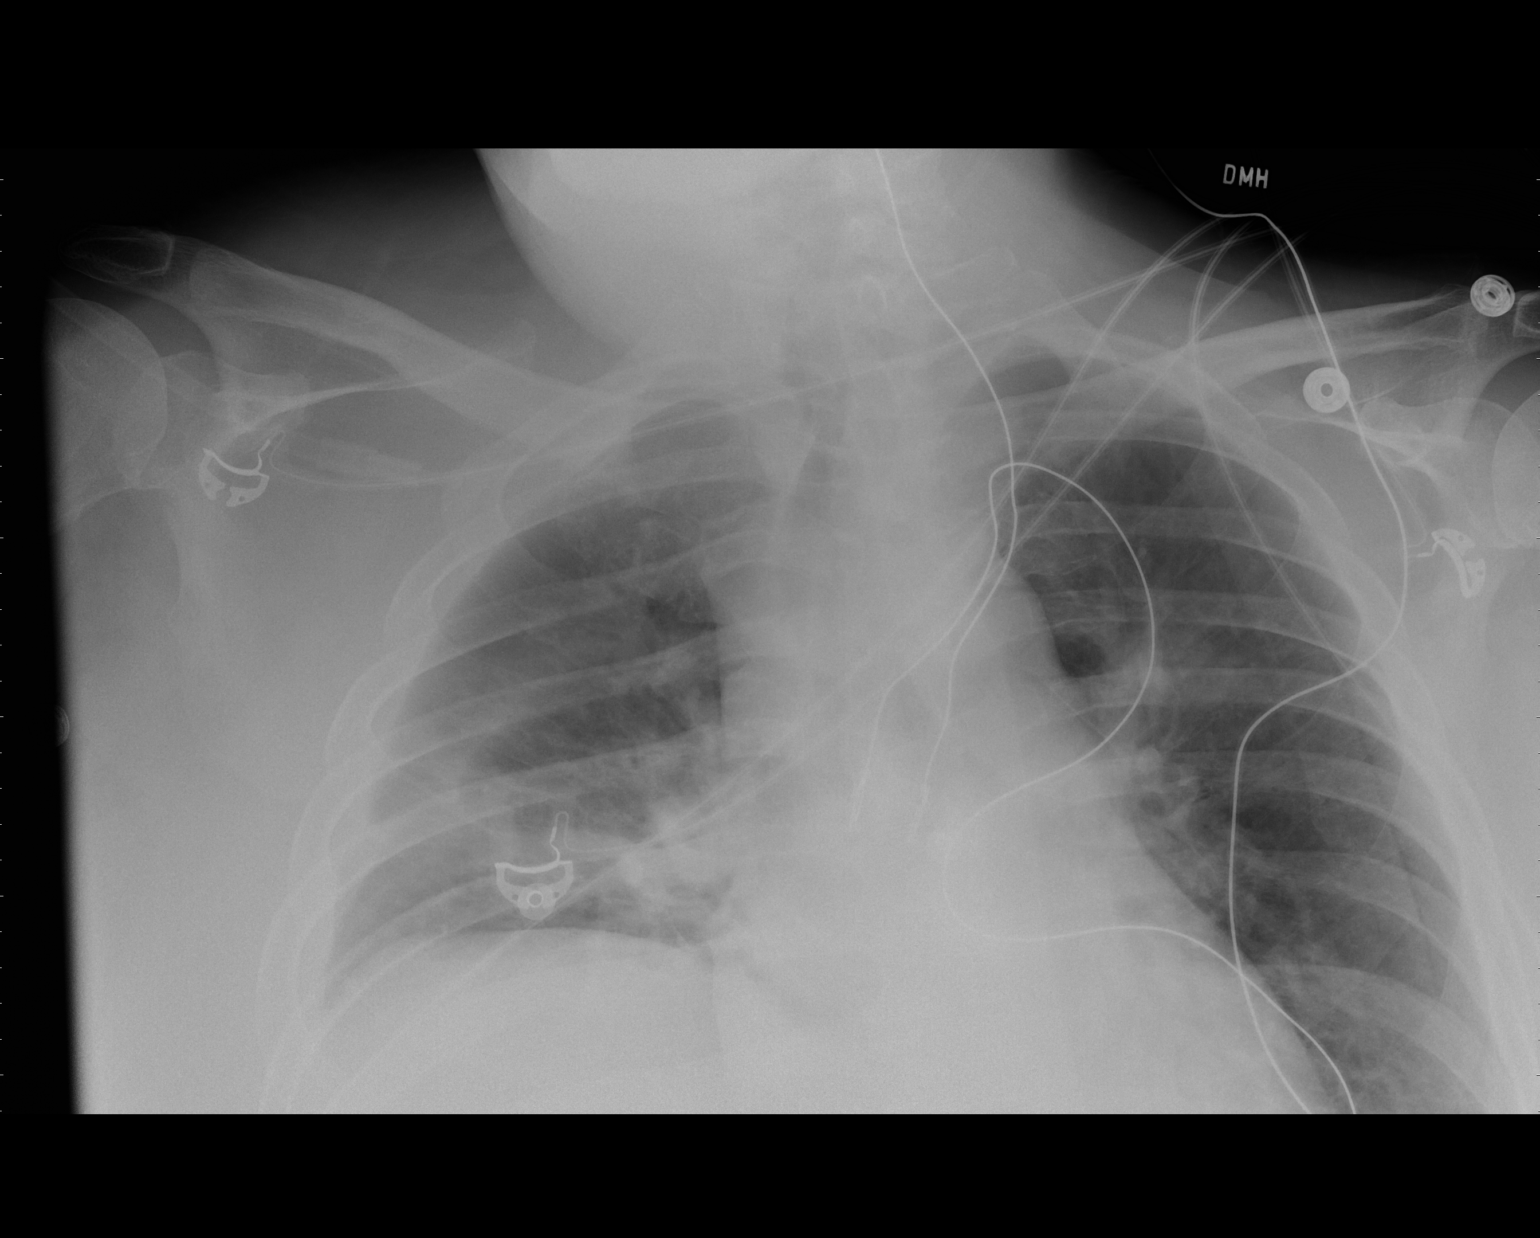

[1 of 1 positions shown; findings below may reference images not displayed]

FINDINGS: Left jugular central venous catheter unchanged.
Continued right basilar atelectasis and infiltrate, essentially
stable.  Marked elevation right hemidiaphragm.  Clear left lung.
Normal heart size.  Right effusion.
IMPRESSION: Stable chest.

## 2011-03-02 IMAGING — CR DG CHEST 1V PORT
1 series · 1 of 1 positions shown · non-contrast
Comparison: 01/27/2011

CLINICAL DATA: PICC line placement

PORTABLE CHEST - 1 VIEW

[AP]
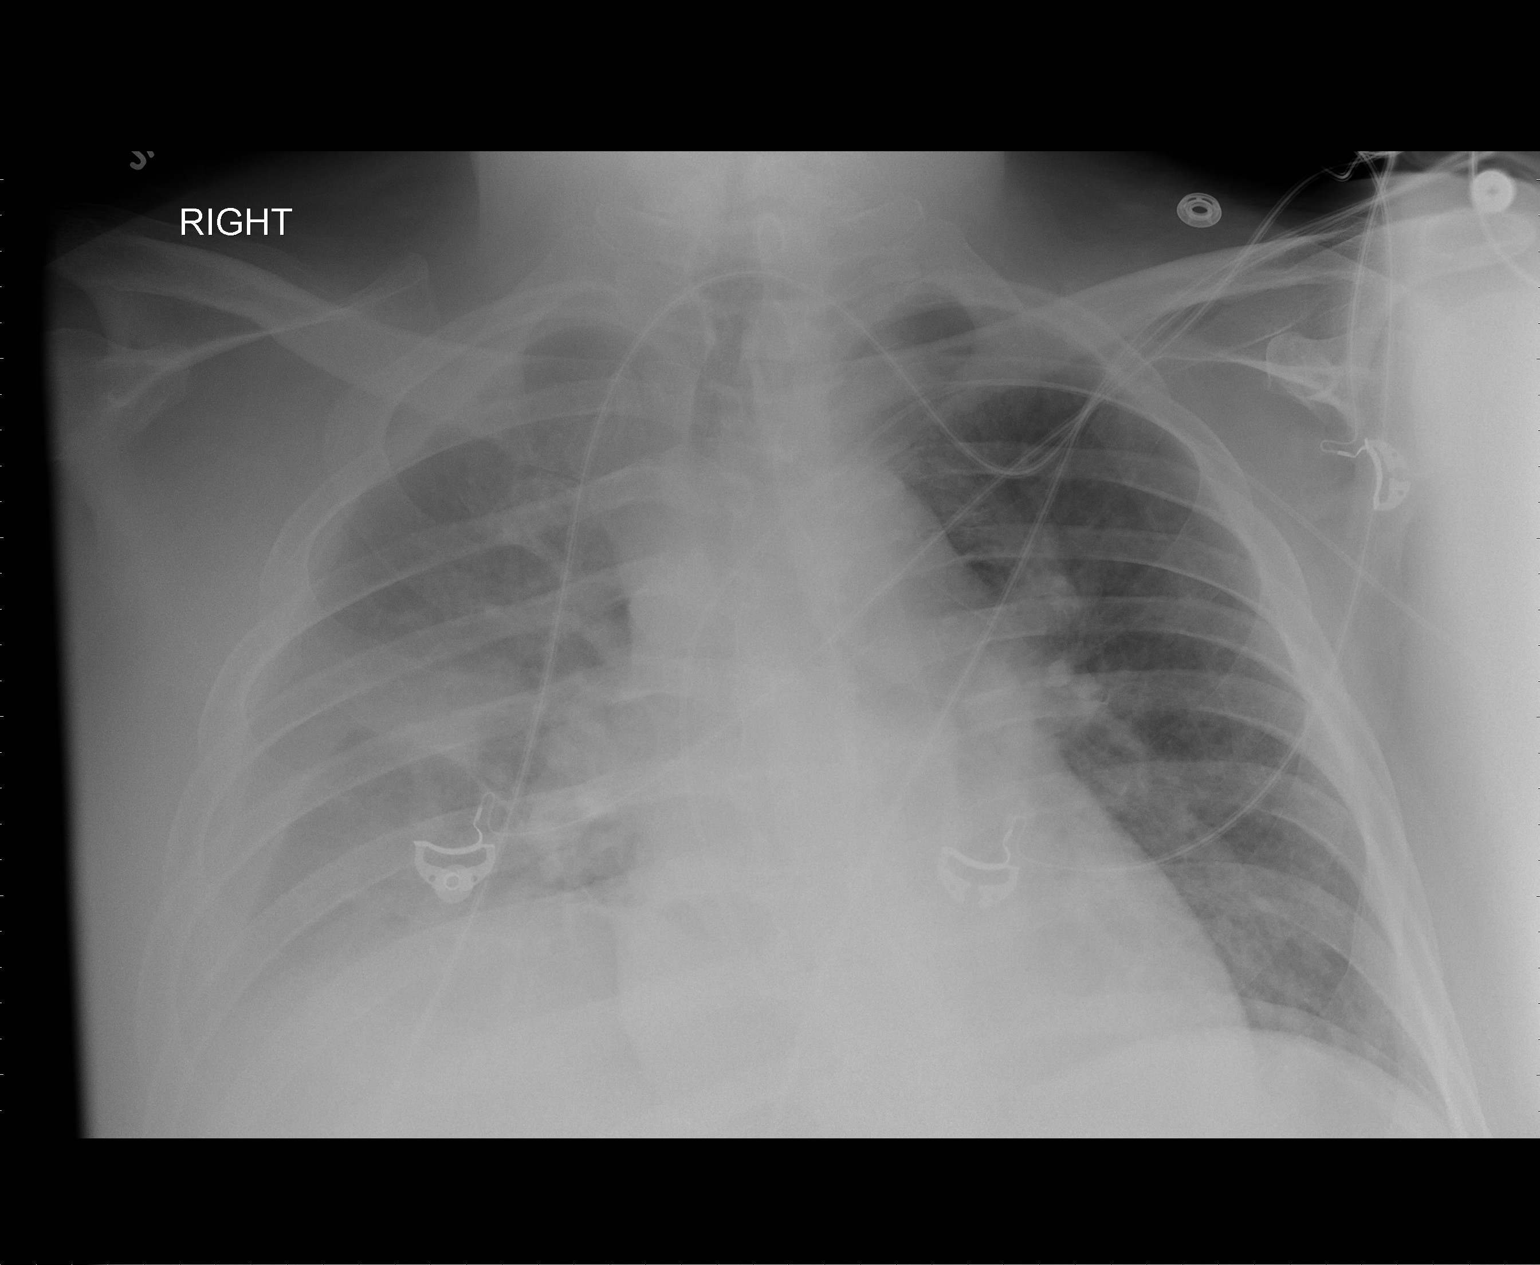

[1 of 1 positions shown; findings below may reference images not displayed]

FINDINGS: 7579 hours.  Left-sided PICC line tip projects in the
right atrium.  Cardiopericardial silhouette is enlarged.  The right
base collapse / consolidation with right pleural effusion has
progressed slightly in the interval.  Left lung shows some minimal
atelectasis at the base. Telemetry leads overlie the chest.
IMPRESSION: Left PICC line tip projects in the mid right atrium.  This could be
pulled back 20-25 mm for positioning at the SVC / RA junction.

Increasing right base collapse / consolidation and pleural
effusion.

## 2011-03-05 IMAGING — CT CT HEAD W/O CM
1 of 2 series · 13 of 30 positions shown, 17 images · non-contrast
Comparison: 10/14/2010

CLINICAL DATA: Acute mental status change

CT HEAD WITHOUT CONTRAST
TECHNIQUE: Contiguous axial images were obtained from the base of
the skull through the vertex without contrast.

[Series 2: brain · axial · 0.49mm/px · z∈[+131,+263]mm · 13 of 32 slices shown, 17 images]
[im 3/32  brain]
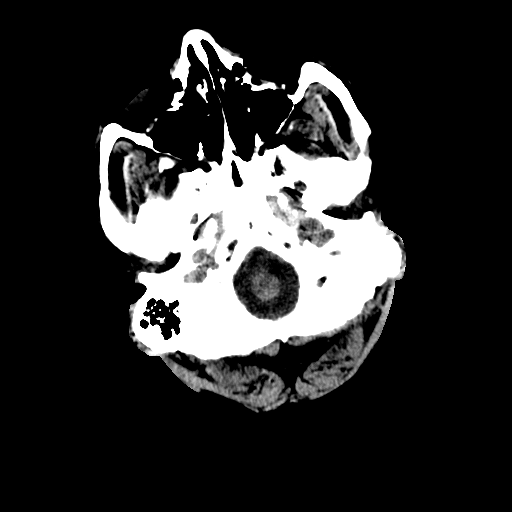
[im 3/32  bone]
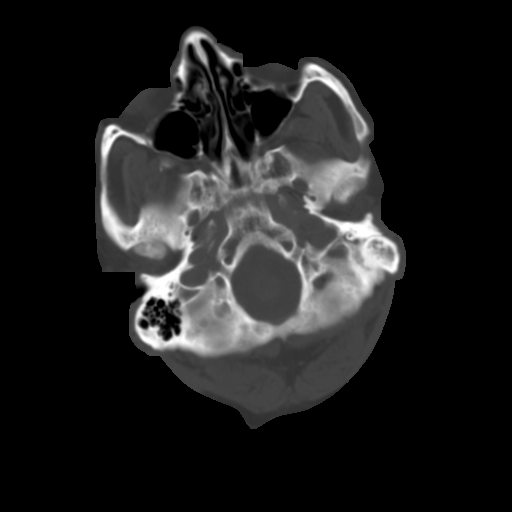
[im 5/32  brain]
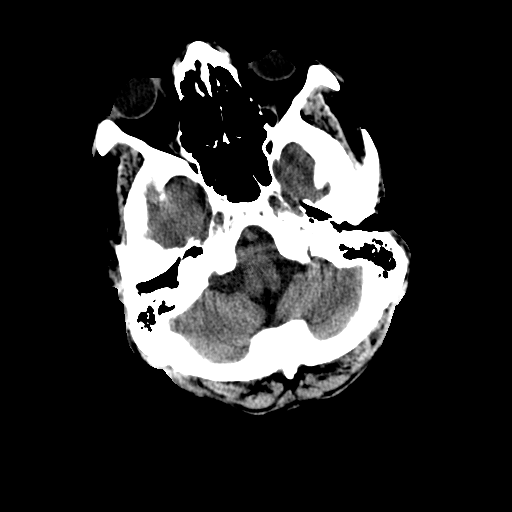
[im 7/32  brain]
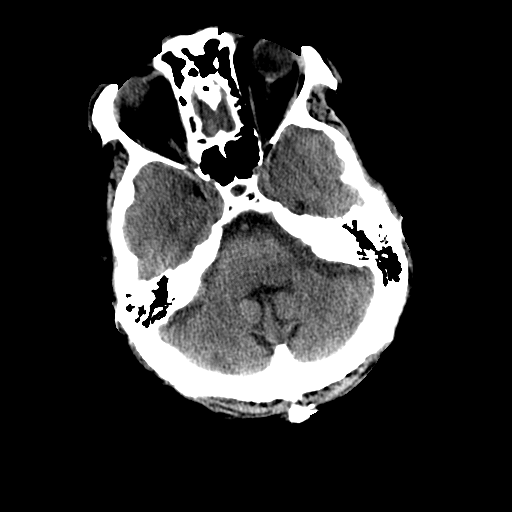
[im 9/32  brain]
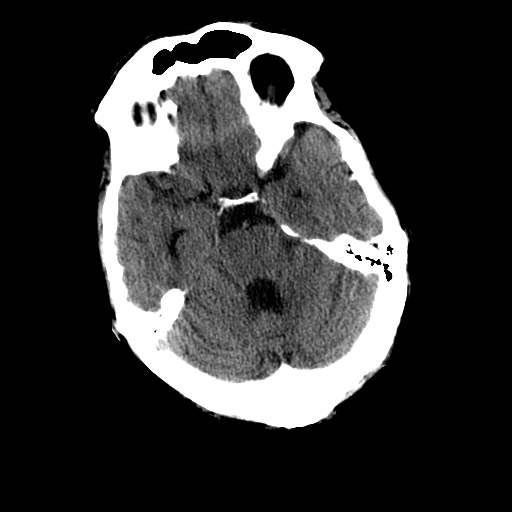
[im 12/32  brain]
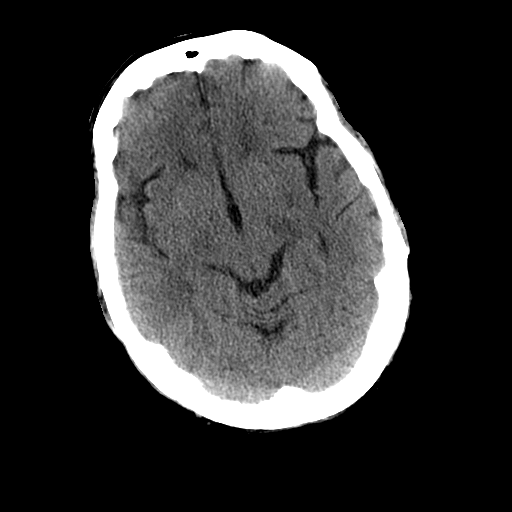
[im 12/32  bone]
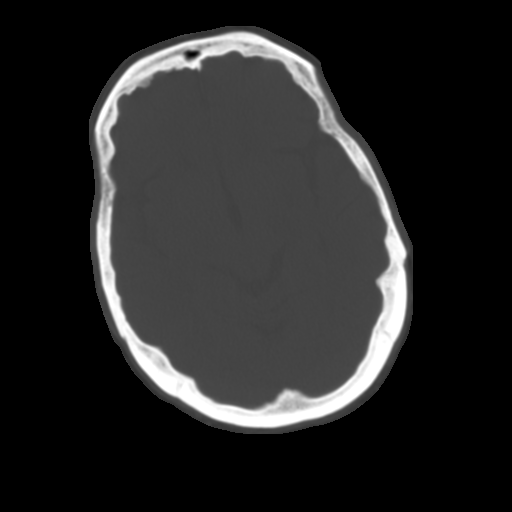
[im 14/32  brain]
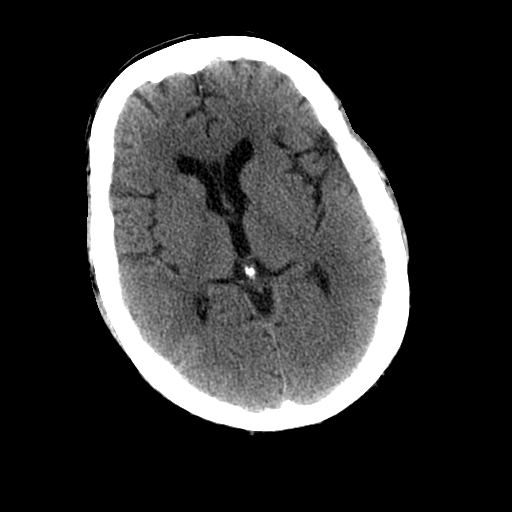
[im 16/32  brain]
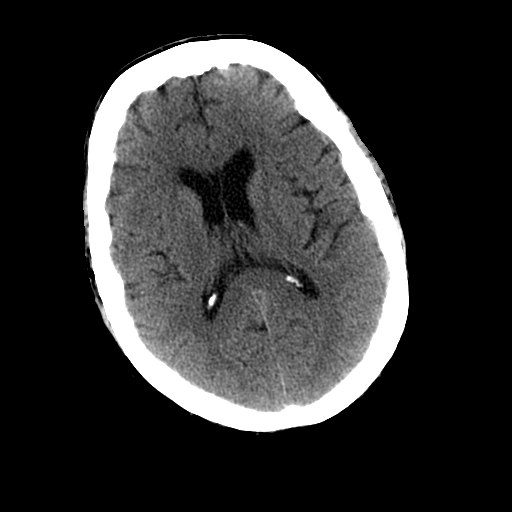
[im 18/32  brain]
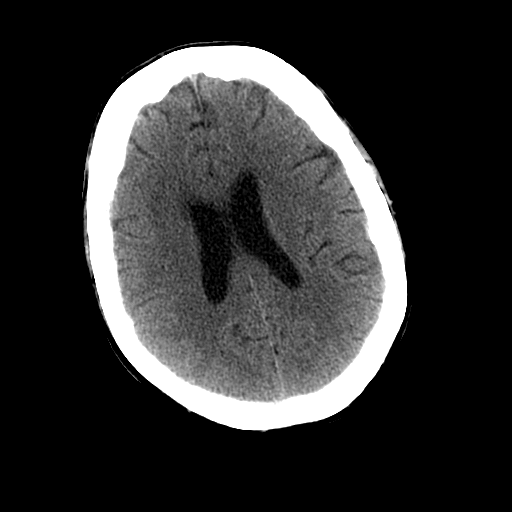
[im 20/32  brain]
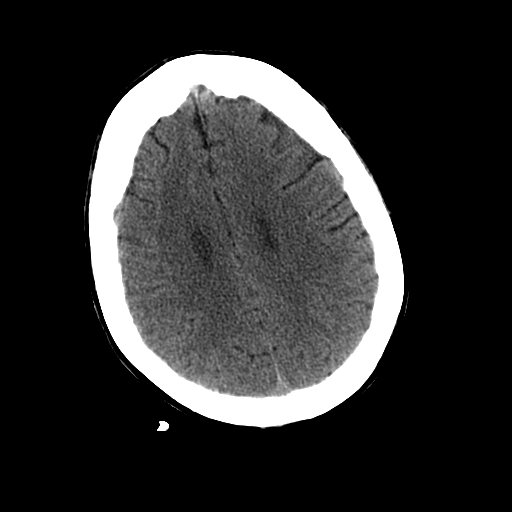
[im 20/32  bone]
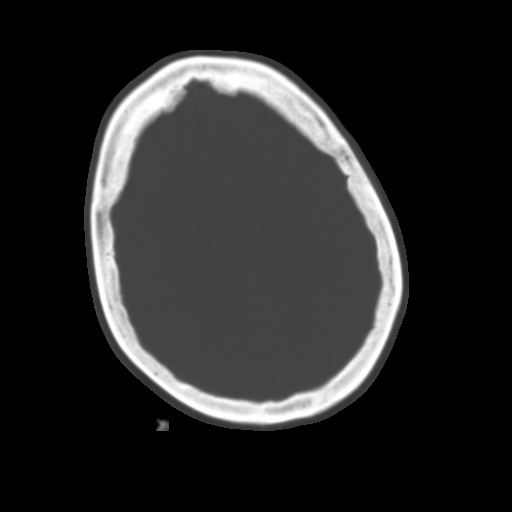
[im 23/32  brain]
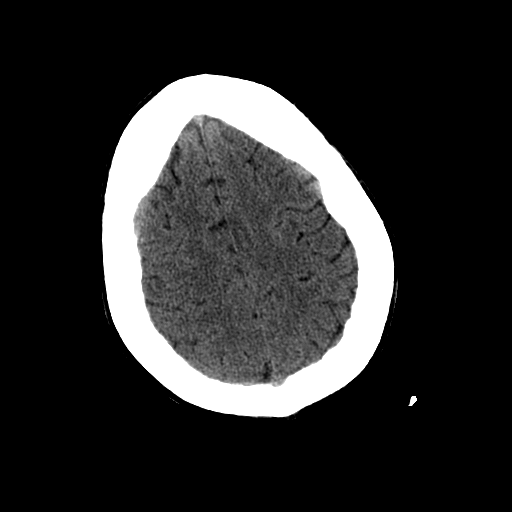
[im 25/32  brain]
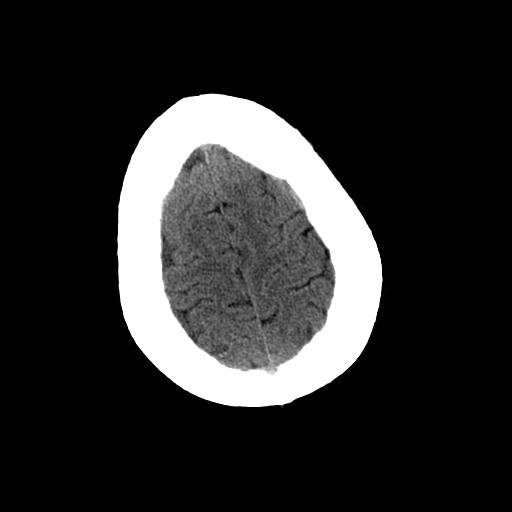
[im 27/32  brain]
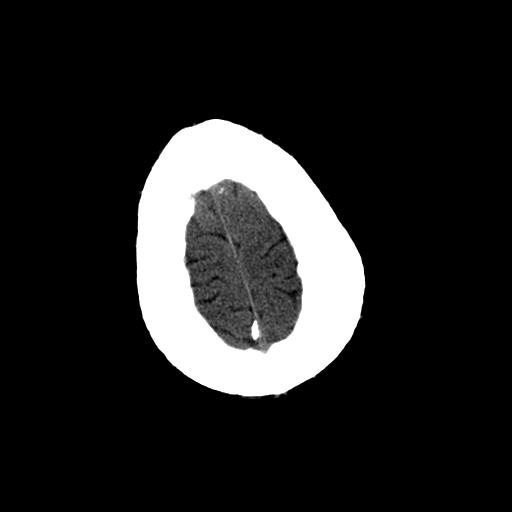
[im 29/32  brain]
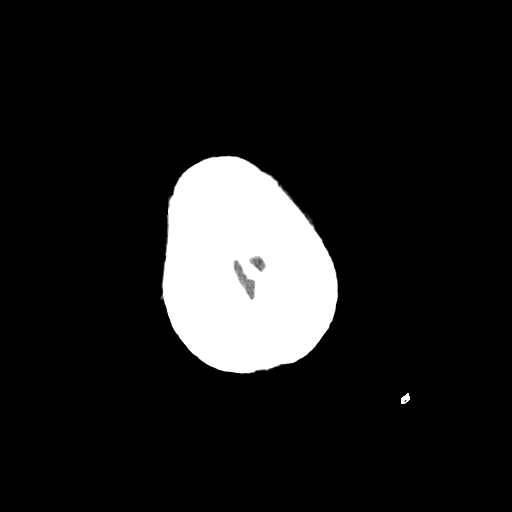
[im 29/32  bone]
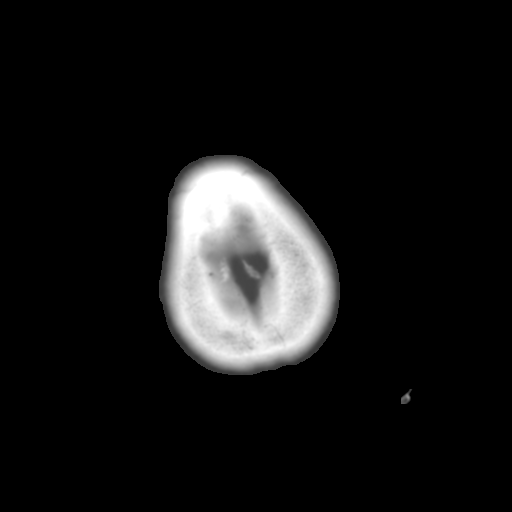

[13 of 30 positions shown; findings below may reference images not displayed]

FINDINGS: There is diffuse patchy low density throughout the subcortical and
periventricular white matter consistent with chronic small vessel
ischemic change.

There is prominence of the sulci and ventricles consistent with
brain atrophy.

There is no evidence for acute brain infarct, hemorrhage or mass.

The paranasal sinuses and the mastoid air cells appear clear.

The skull is normal.
IMPRESSION: 1.  Small vessel ischemic change and brain atrophy.
2.  No acute intracranial abnormalities noted.

## 2011-03-05 IMAGING — CR DG CHEST 2V
1 series · 1 of 1 positions shown · non-contrast
Comparison: 01/28/2011 and back to 01/25/2011.

CLINICAL DATA: Sepsis.  Diaphoresis.  Short of breath.  Weakness.

CHEST - 2 VIEW

[view not recorded]
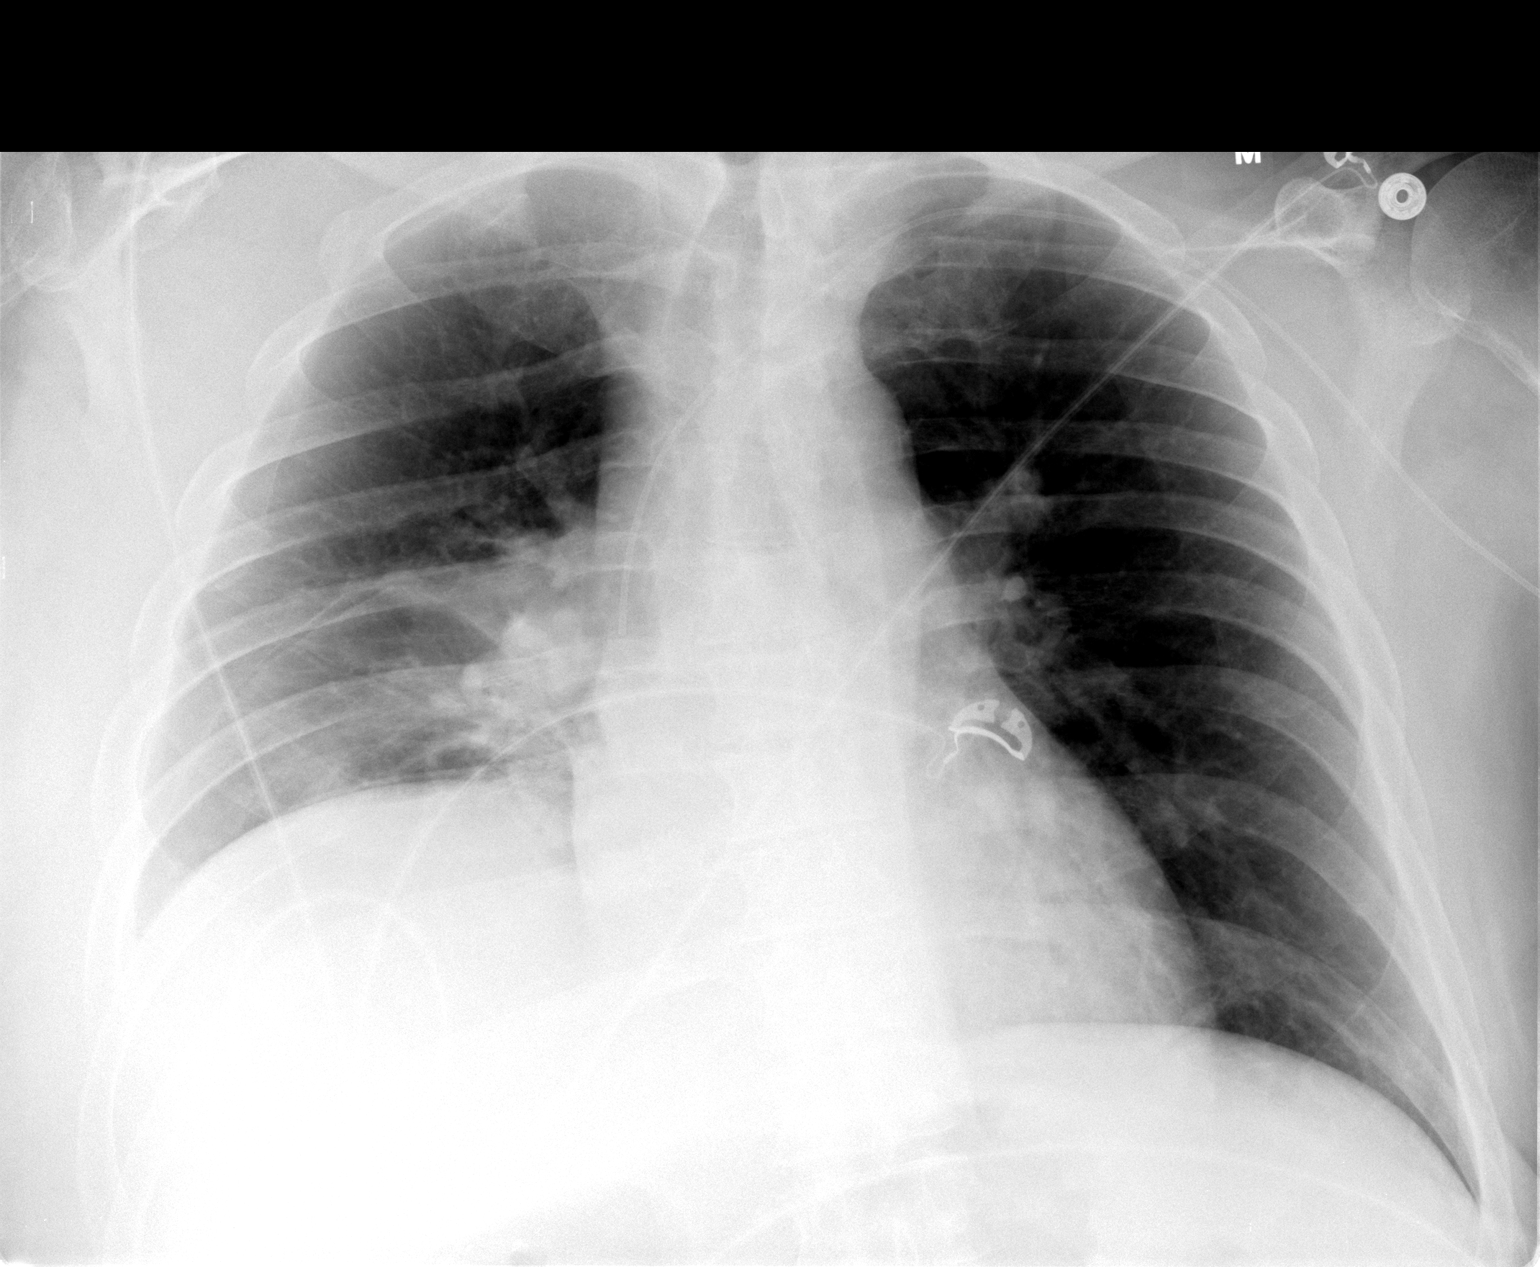

[1 of 1 positions shown; findings below may reference images not displayed]

FINDINGS: Lateral view suboptimal due to patient positioning and
obliquity.  Left-sided PICC line terminates at the mid SVC.

Moderate right hemidiaphragm elevation. Normal heart size.  Suspect
small left and possible small right pleural effusion on the lateral
view. No pneumothorax.  Improved aeration with mild right base air
space disease remaining.  Nearly completely resolved interstitial
edema.  Left lung clear.
IMPRESSION: 1.  Improved aeration with nearly completely resolved interstitial
edema.
2.  Suspect residual small bilateral pleural effusions with right
base air space disease, either infection or atelectasis.

## 2011-03-13 LAB — DIFFERENTIAL
Basophils Absolute: 0 10*3/uL (ref 0.0–0.1)
Basophils Relative: 0 % (ref 0–1)
Eosinophils Absolute: 0 10*3/uL (ref 0.0–0.7)
Eosinophils Relative: 0 % (ref 0–5)
Monocytes Absolute: 1.3 10*3/uL — ABNORMAL HIGH (ref 0.1–1.0)
Neutro Abs: 19.4 10*3/uL — ABNORMAL HIGH (ref 1.7–7.7)

## 2011-03-13 LAB — BASIC METABOLIC PANEL
BUN: 29 mg/dL — ABNORMAL HIGH (ref 6–23)
BUN: 32 mg/dL — ABNORMAL HIGH (ref 6–23)
CO2: 28 mEq/L (ref 19–32)
Calcium: 8.1 mg/dL — ABNORMAL LOW (ref 8.4–10.5)
Calcium: 9 mg/dL (ref 8.4–10.5)
Chloride: 112 mEq/L (ref 96–112)
Creatinine, Ser: 0.6 mg/dL (ref 0.4–1.5)
GFR calc Af Amer: >60 mL/min (ref >60)
GFR calc non Af Amer: >60 mL/min (ref >60)
GFR calc non Af Amer: >60 mL/min (ref >60)
GFR calc non Af Amer: >60 mL/min (ref >60)
Glucose, Bld: 101 mg/dL — ABNORMAL HIGH (ref 70–99)
Glucose, Bld: 112 mg/dL — ABNORMAL HIGH (ref 70–99)
Glucose, Bld: 98 mg/dL (ref 70–99)
Potassium: 3.1 mEq/L — ABNORMAL LOW (ref 3.5–5.1)
Potassium: 4.2 mEq/L (ref 3.5–5.1)
Potassium: 4.2 mEq/L (ref 3.5–5.1)
Sodium: 144 mEq/L (ref 135–145)
Sodium: 142 mEq/L (ref 135–145)

## 2011-03-13 LAB — URINE CULTURE
Colony Count: NO GROWTH
Culture  Setup Time: 201108180221

## 2011-03-13 LAB — CBC
HCT: 29.1 % — ABNORMAL LOW (ref 39.0–52.0)
HCT: 32.5 % — ABNORMAL LOW (ref 39.0–52.0)
HCT: 33.6 % — ABNORMAL LOW (ref 39.0–52.0)
HCT: 38.2 % — ABNORMAL LOW (ref 39.0–52.0)
Hemoglobin: 9.7 g/dL — ABNORMAL LOW (ref 13.0–17.0)
MCH: 28 pg (ref 26.0–34.0)
MCH: 28.8 pg (ref 26.0–34.0)
MCHC: 33.4 g/dL (ref 30.0–36.0)
MCHC: 33.6 g/dL (ref 30.0–36.0)
MCHC: 33.6 g/dL (ref 30.0–36.0)
MCHC: 33.6 g/dL (ref 30.0–36.0)
MCHC: 34.5 g/dL (ref 30.0–36.0)
MCV: 84.3 fL (ref 78.0–100.0)
MCV: 83.4 fL (ref 78.0–100.0)
MCV: 85.5 fL (ref 78.0–100.0)
Platelets: 112 10*3/uL — ABNORMAL LOW (ref 150–400)
Platelets: 134 10*3/uL — ABNORMAL LOW (ref 150–400)
Platelets: 138 10*3/uL — ABNORMAL LOW (ref 150–400)
Platelets: 109 10*3/uL — ABNORMAL LOW (ref 150–400)
RBC: 3.72 MIL/uL — ABNORMAL LOW (ref 4.22–5.81)
RDW: 12.8 % (ref 11.5–15.5)
RDW: 13.2 % (ref 11.5–15.5)
RDW: 13.3 % (ref 11.5–15.5)
RDW: 13.4 % (ref 11.5–15.5)
WBC: 6.2 10*3/uL (ref 4.0–10.5)
WBC: 9.9 10*3/uL (ref 4.0–10.5)

## 2011-03-13 LAB — PROTIME-INR
INR: 3 — ABNORMAL HIGH (ref 0.00–1.49)
INR: 3.68 — ABNORMAL HIGH (ref 0.00–1.49)
Prothrombin Time: 22.8 seconds — ABNORMAL HIGH (ref 11.6–15.2)
Prothrombin Time: 36.5 seconds — ABNORMAL HIGH (ref 11.6–15.2)
Prothrombin Time: 24.3 seconds — ABNORMAL HIGH (ref 11.6–15.2)

## 2011-03-13 LAB — COMPREHENSIVE METABOLIC PANEL
ALT: 25 U/L (ref 0–53)
AST: 20 U/L (ref 0–37)
Albumin: 2.7 g/dL — ABNORMAL LOW (ref 3.5–5.2)
Albumin: 2.7 g/dL — ABNORMAL LOW (ref 3.5–5.2)
Alkaline Phosphatase: 68 U/L (ref 39–117)
BUN: 22 mg/dL (ref 6–23)
Calcium: 9 mg/dL (ref 8.4–10.5)
Chloride: 112 mEq/L (ref 96–112)
GFR calc Af Amer: >60 mL/min (ref >60)
Glucose, Bld: 164 mg/dL — ABNORMAL HIGH (ref 70–99)
Potassium: 3.5 mEq/L (ref 3.5–5.1)
Sodium: 138 mEq/L (ref 135–145)
Sodium: 142 mEq/L (ref 135–145)
Total Bilirubin: 0.6 mg/dL (ref 0.3–1.2)
Total Protein: 6 g/dL (ref 6.0–8.3)

## 2011-03-13 LAB — URINALYSIS, ROUTINE W REFLEX MICROSCOPIC
Nitrite: NEGATIVE
Protein, ur: 100 mg/dL — AB
Specific Gravity, Urine: 1.024 (ref 1.005–1.030)
Urobilinogen, UA: 1 mg/dL (ref 0.0–1.0)

## 2011-03-13 LAB — CULTURE, BLOOD (ROUTINE X 2): Culture: NO GROWTH

## 2011-03-13 LAB — URINE MICROSCOPIC-ADD ON

## 2011-03-13 LAB — T3, FREE: T3, Free: 4.8 pg/mL — ABNORMAL HIGH (ref 2.3–4.2)

## 2011-03-13 LAB — MRSA PCR SCREENING: MRSA by PCR: NEGATIVE

## 2011-03-13 LAB — LACTIC ACID, PLASMA: Lactic Acid, Venous: 1.1 mmol/L (ref 0.5–2.2)

## 2011-03-13 LAB — MAGNESIUM: Magnesium: 1.7 mg/dL (ref 1.5–2.5)

## 2011-03-15 LAB — BASIC METABOLIC PANEL
BUN: 10 mg/dL (ref 6–23)
BUN: 11 mg/dL (ref 6–23)
BUN: 5 mg/dL — ABNORMAL LOW (ref 6–23)
BUN: 5 mg/dL — ABNORMAL LOW (ref 6–23)
BUN: 8 mg/dL (ref 6–23)
CO2: 23 mEq/L (ref 19–32)
CO2: 23 mEq/L (ref 19–32)
CO2: 24 mEq/L (ref 19–32)
Calcium: 7.6 mg/dL — ABNORMAL LOW (ref 8.4–10.5)
Calcium: 7.9 mg/dL — ABNORMAL LOW (ref 8.4–10.5)
Calcium: 8.1 mg/dL — ABNORMAL LOW (ref 8.4–10.5)
Calcium: 8.6 mg/dL (ref 8.4–10.5)
Chloride: 108 mEq/L (ref 96–112)
Chloride: 110 mEq/L (ref 96–112)
Chloride: 113 mEq/L — ABNORMAL HIGH (ref 96–112)
Chloride: 114 mEq/L — ABNORMAL HIGH (ref 96–112)
Chloride: 118 mEq/L — ABNORMAL HIGH (ref 96–112)
Creatinine, Ser: 0.67 mg/dL (ref 0.4–1.5)
Creatinine, Ser: 0.72 mg/dL (ref 0.4–1.5)
Creatinine, Ser: 0.81 mg/dL (ref 0.4–1.5)
GFR calc Af Amer: >60 mL/min (ref >60)
GFR calc Af Amer: >60 mL/min (ref >60)
GFR calc non Af Amer: >60 mL/min (ref >60)
GFR calc non Af Amer: >60 mL/min (ref >60)
Glucose, Bld: 127 mg/dL — ABNORMAL HIGH (ref 70–99)
Glucose, Bld: 81 mg/dL (ref 70–99)
Glucose, Bld: 84 mg/dL (ref 70–99)
Glucose, Bld: 84 mg/dL (ref 70–99)
Glucose, Bld: 96 mg/dL (ref 70–99)
Potassium: 4.2 mEq/L (ref 3.5–5.1)
Potassium: 5.4 mEq/L — ABNORMAL HIGH (ref 3.5–5.1)
Sodium: 140 mEq/L (ref 135–145)
Sodium: 143 mEq/L (ref 135–145)

## 2011-03-15 LAB — DIFFERENTIAL
Basophils Absolute: 0 10*3/uL (ref 0.0–0.1)
Basophils Absolute: 0 10*3/uL (ref 0.0–0.1)
Basophils Absolute: 0.1 10*3/uL (ref 0.0–0.1)
Basophils Relative: 0 % (ref 0–1)
Eosinophils Absolute: 0 10*3/uL (ref 0.0–0.7)
Eosinophils Absolute: 0.1 10*3/uL (ref 0.0–0.7)
Eosinophils Absolute: 0.1 10*3/uL (ref 0.0–0.7)
Eosinophils Relative: 1 % (ref 0–5)
Lymphocytes Relative: 22 % (ref 12–46)
Lymphocytes Relative: 24 % (ref 12–46)
Monocytes Absolute: 0.5 10*3/uL (ref 0.1–1.0)
Monocytes Relative: 4 % (ref 3–12)
Monocytes Relative: 9 % (ref 3–12)
Neutro Abs: 3.2 10*3/uL (ref 1.7–7.7)
Neutro Abs: 4.6 10*3/uL (ref 1.7–7.7)
Neutrophils Relative %: 66 % (ref 43–77)
Neutrophils Relative %: 70 % (ref 43–77)
Neutrophils Relative %: 71 % (ref 43–77)

## 2011-03-15 LAB — BODY FLUID CULTURE

## 2011-03-15 LAB — CBC
HCT: 27.7 % — ABNORMAL LOW (ref 39.0–52.0)
HCT: 27.8 % — ABNORMAL LOW (ref 39.0–52.0)
HCT: 31 % — ABNORMAL LOW (ref 39.0–52.0)
HCT: 42 % (ref 39.0–52.0)
Hemoglobin: 13.3 g/dL (ref 13.0–17.0)
Hemoglobin: 9.5 g/dL — ABNORMAL LOW (ref 13.0–17.0)
MCH: 27.7 pg (ref 26.0–34.0)
MCH: 27.8 pg (ref 26.0–34.0)
MCH: 28.6 pg (ref 26.0–34.0)
MCH: 28.9 pg (ref 26.0–34.0)
MCHC: 32.7 g/dL (ref 30.0–36.0)
MCHC: 32.8 g/dL (ref 30.0–36.0)
MCHC: 32.8 g/dL (ref 30.0–36.0)
MCHC: 33.6 g/dL (ref 30.0–36.0)
MCHC: 33.9 g/dL (ref 30.0–36.0)
MCHC: 34.4 g/dL (ref 30.0–36.0)
MCHC: 34.6 g/dL (ref 30.0–36.0)
MCV: 83.2 fL (ref 78.0–100.0)
MCV: 83.6 fL (ref 78.0–100.0)
MCV: 84.5 fL (ref 78.0–100.0)
MCV: 84.6 fL (ref 78.0–100.0)
MCV: 84.8 fL (ref 78.0–100.0)
Platelets: 134 10*3/uL — ABNORMAL LOW (ref 150–400)
Platelets: 136 10*3/uL — ABNORMAL LOW (ref 150–400)
Platelets: 177 10*3/uL (ref 150–400)
Platelets: 210 10*3/uL (ref 150–400)
RBC: 3.33 MIL/uL — ABNORMAL LOW (ref 4.22–5.81)
RBC: 4.59 MIL/uL (ref 4.22–5.81)
RDW: 13.6 % (ref 11.5–15.5)
RDW: 13.9 % (ref 11.5–15.5)
RDW: 14 % (ref 11.5–15.5)
RDW: 14.2 % (ref 11.5–15.5)
RDW: 14.2 % (ref 11.5–15.5)
WBC: 19.4 10*3/uL — ABNORMAL HIGH (ref 4.0–10.5)
WBC: 6.3 10*3/uL (ref 4.0–10.5)
WBC: 8.9 10*3/uL (ref 4.0–10.5)
WBC: 8.9 10*3/uL (ref 4.0–10.5)

## 2011-03-15 LAB — COMPREHENSIVE METABOLIC PANEL
AST: 21 U/L (ref 0–37)
Albumin: 3.3 g/dL — ABNORMAL LOW (ref 3.5–5.2)
Albumin: 4.2 g/dL (ref 3.5–5.2)
Alkaline Phosphatase: 103 U/L (ref 39–117)
BUN: 16 mg/dL (ref 6–23)
Calcium: 9.7 mg/dL (ref 8.4–10.5)
Chloride: 106 mEq/L (ref 96–112)
GFR calc Af Amer: >60 mL/min (ref >60)
Glucose, Bld: 81 mg/dL (ref 70–99)
Potassium: 3.6 mEq/L (ref 3.5–5.1)
Sodium: 132 mEq/L — ABNORMAL LOW (ref 135–145)
Total Bilirubin: 1.2 mg/dL (ref 0.3–1.2)
Total Protein: 8.2 g/dL (ref 6.0–8.3)

## 2011-03-15 LAB — SAMPLE TO BLOOD BANK

## 2011-03-15 LAB — URINE CULTURE: Colony Count: NO GROWTH

## 2011-03-15 LAB — PROTIME-INR
INR: 1.46 (ref 0.00–1.49)
INR: 1.53 — ABNORMAL HIGH (ref 0.00–1.49)
INR: 2.28 — ABNORMAL HIGH (ref 0.00–1.49)
INR: 3.92 — ABNORMAL HIGH (ref 0.00–1.49)
Prothrombin Time: 18.3 seconds — ABNORMAL HIGH (ref 11.6–15.2)
Prothrombin Time: 28.9 seconds — ABNORMAL HIGH (ref 11.6–15.2)
Prothrombin Time: 37.7 seconds — ABNORMAL HIGH (ref 11.6–15.2)
Prothrombin Time: 38.1 seconds — ABNORMAL HIGH (ref 11.6–15.2)

## 2011-03-15 LAB — BLOOD GAS, ARTERIAL
Bicarbonate: 18.5 mEq/L — ABNORMAL LOW (ref 20.0–24.0)
pH, Arterial: 7.371 (ref 7.350–7.450)
pO2, Arterial: 94.2 mmHg (ref 80.0–100.0)

## 2011-03-15 LAB — HEMOGLOBIN A1C: Mean Plasma Glucose: 77 mg/dL (ref <117)

## 2011-03-15 LAB — POCT I-STAT, CHEM 8
Creatinine, Ser: 1 mg/dL (ref 0.4–1.5)
HCT: 42 % (ref 39.0–52.0)
Hemoglobin: 14.3 g/dL (ref 13.0–17.0)
Potassium: 4.5 mEq/L (ref 3.5–5.1)
Sodium: 129 mEq/L — ABNORMAL LOW (ref 135–145)
TCO2: 26 mmol/L (ref 0–100)

## 2011-03-15 LAB — LIPASE, BLOOD: Lipase: 30 U/L (ref 11–59)

## 2011-03-15 LAB — PROCALCITONIN: Procalcitonin: <0.5 ng/mL

## 2011-03-15 LAB — URINALYSIS, ROUTINE W REFLEX MICROSCOPIC
Bilirubin Urine: NEGATIVE
Glucose, UA: NEGATIVE mg/dL
Ketones, ur: NEGATIVE mg/dL
Nitrite: NEGATIVE
Specific Gravity, Urine: 1.016 (ref 1.005–1.030)
pH: 8 (ref 5.0–8.0)

## 2011-03-15 LAB — TSH: TSH: <0.004 u[IU]/mL — ABNORMAL LOW (ref 0.350–4.500)

## 2011-03-15 LAB — RENIN: Renin Activity: 0.14 ng/mL/h — ABNORMAL LOW (ref 0.25–5.82)

## 2011-03-15 LAB — APTT: aPTT: 39 seconds — ABNORMAL HIGH (ref 24–37)

## 2011-03-15 LAB — CORTISOL-AM, BLOOD: Cortisol - AM: 1.2 ug/dL — ABNORMAL LOW (ref 4.3–22.4)

## 2011-03-15 LAB — POCT CARDIAC MARKERS: Troponin i, poc: <0.05 ng/mL (ref 0.00–0.09)

## 2011-03-15 LAB — CLOSTRIDIUM DIFFICILE EIA

## 2011-03-16 LAB — PROTIME-INR
INR: 2.21 — ABNORMAL HIGH (ref 0.00–1.49)
INR: 2.42 — ABNORMAL HIGH (ref 0.00–1.49)
INR: 2.58 — ABNORMAL HIGH (ref 0.00–1.49)
INR: 3.12 — ABNORMAL HIGH (ref 0.00–1.49)
Prothrombin Time: 24.3 seconds — ABNORMAL HIGH (ref 11.6–15.2)
Prothrombin Time: 30.5 seconds — ABNORMAL HIGH (ref 11.6–15.2)
Prothrombin Time: 31.9 seconds — ABNORMAL HIGH (ref 11.6–15.2)

## 2011-03-16 LAB — CBC
HCT: 35.3 % — ABNORMAL LOW (ref 39.0–52.0)
HCT: 25.7 % — ABNORMAL LOW (ref 39.0–52.0)
HCT: 27.4 % — ABNORMAL LOW (ref 39.0–52.0)
HCT: 28.2 % — ABNORMAL LOW (ref 39.0–52.0)
HCT: 31.1 % — ABNORMAL LOW (ref 39.0–52.0)
Hemoglobin: 9.5 g/dL — ABNORMAL LOW (ref 13.0–17.0)
MCHC: 34.6 g/dL (ref 30.0–36.0)
MCHC: 34.4 g/dL (ref 30.0–36.0)
MCHC: 34.4 g/dL (ref 30.0–36.0)
MCHC: 34.6 g/dL (ref 30.0–36.0)
MCHC: 34.8 g/dL (ref 30.0–36.0)
MCV: 83.1 fL (ref 78.0–100.0)
MCV: 83.3 fL (ref 78.0–100.0)
MCV: 83.7 fL (ref 78.0–100.0)
MCV: 83.8 fL (ref 78.0–100.0)
MCV: 84.3 fL (ref 78.0–100.0)
Platelets: 187 10*3/uL (ref 150–400)
Platelets: 209 10*3/uL (ref 150–400)
Platelets: 123 10*3/uL — ABNORMAL LOW (ref 150–400)
Platelets: 141 10*3/uL — ABNORMAL LOW (ref 150–400)
Platelets: 147 10*3/uL — ABNORMAL LOW (ref 150–400)
Platelets: 153 10*3/uL (ref 150–400)
Platelets: 168 10*3/uL (ref 150–400)
Platelets: 177 10*3/uL (ref 150–400)
Platelets: 181 10*3/uL (ref 150–400)
RBC: 3.74 MIL/uL — ABNORMAL LOW (ref 4.22–5.81)
RBC: 3.29 MIL/uL — ABNORMAL LOW (ref 4.22–5.81)
RBC: 3.78 MIL/uL — ABNORMAL LOW (ref 4.22–5.81)
RDW: 15.4 % (ref 11.5–15.5)
RDW: 15.6 % — ABNORMAL HIGH (ref 11.5–15.5)
RDW: 15.9 % — ABNORMAL HIGH (ref 11.5–15.5)
RDW: 16.2 % — ABNORMAL HIGH (ref 11.5–15.5)
RDW: 16.5 % — ABNORMAL HIGH (ref 11.5–15.5)
RDW: 17 % — ABNORMAL HIGH (ref 11.5–15.5)
WBC: 13.4 10*3/uL — ABNORMAL HIGH (ref 4.0–10.5)
WBC: 15.4 10*3/uL — ABNORMAL HIGH (ref 4.0–10.5)
WBC: 14.1 10*3/uL — ABNORMAL HIGH (ref 4.0–10.5)
WBC: 18 10*3/uL — ABNORMAL HIGH (ref 4.0–10.5)
WBC: 7.1 10*3/uL (ref 4.0–10.5)

## 2011-03-16 LAB — COMPREHENSIVE METABOLIC PANEL
ALT: 13 U/L (ref 0–53)
ALT: 18 U/L (ref 0–53)
AST: 17 U/L (ref 0–37)
AST: 16 U/L (ref 0–37)
AST: 20 U/L (ref 0–37)
AST: 23 U/L (ref 0–37)
Albumin: 3.5 g/dL (ref 3.5–5.2)
Albumin: 2.7 g/dL — ABNORMAL LOW (ref 3.5–5.2)
Albumin: 3.4 g/dL — ABNORMAL LOW (ref 3.5–5.2)
Albumin: 3.9 g/dL (ref 3.5–5.2)
Alkaline Phosphatase: 89 U/L (ref 39–117)
BUN: 43 mg/dL — ABNORMAL HIGH (ref 6–23)
BUN: 13 mg/dL (ref 6–23)
CO2: 24 mEq/L (ref 19–32)
Calcium: 9.4 mg/dL (ref 8.4–10.5)
Calcium: 9.4 mg/dL (ref 8.4–10.5)
Chloride: 105 mEq/L (ref 96–112)
Chloride: 103 mEq/L (ref 96–112)
Creatinine, Ser: 0.9 mg/dL (ref 0.4–1.5)
Creatinine, Ser: 2.04 mg/dL — ABNORMAL HIGH (ref 0.4–1.5)
Creatinine, Ser: 2.46 mg/dL — ABNORMAL HIGH (ref 0.4–1.5)
GFR calc Af Amer: 35 mL/min — ABNORMAL LOW (ref >60)
GFR calc Af Amer: 44 mL/min — ABNORMAL LOW (ref >60)
GFR calc non Af Amer: 36 mL/min — ABNORMAL LOW (ref >60)
GFR calc non Af Amer: >60 mL/min (ref >60)
Potassium: 4.9 mEq/L (ref 3.5–5.1)
Potassium: 3.6 mEq/L (ref 3.5–5.1)
Sodium: 143 mEq/L (ref 135–145)
Total Bilirubin: 1 mg/dL (ref 0.3–1.2)
Total Bilirubin: 0.3 mg/dL (ref 0.3–1.2)
Total Bilirubin: 0.4 mg/dL (ref 0.3–1.2)
Total Bilirubin: 0.6 mg/dL (ref 0.3–1.2)
Total Protein: 6 g/dL (ref 6.0–8.3)
Total Protein: 8.3 g/dL (ref 6.0–8.3)

## 2011-03-16 LAB — MRSA PCR SCREENING

## 2011-03-16 LAB — GLUCOSE, CAPILLARY
Glucose-Capillary: 79 mg/dL (ref 70–99)
Glucose-Capillary: 86 mg/dL (ref 70–99)
Glucose-Capillary: 94 mg/dL (ref 70–99)
Glucose-Capillary: 100 mg/dL — ABNORMAL HIGH (ref 70–99)
Glucose-Capillary: 114 mg/dL — ABNORMAL HIGH (ref 70–99)
Glucose-Capillary: 138 mg/dL — ABNORMAL HIGH (ref 70–99)
Glucose-Capillary: 153 mg/dL — ABNORMAL HIGH (ref 70–99)
Glucose-Capillary: 65 mg/dL — ABNORMAL LOW (ref 70–99)
Glucose-Capillary: 68 mg/dL — ABNORMAL LOW (ref 70–99)
Glucose-Capillary: 69 mg/dL — ABNORMAL LOW (ref 70–99)
Glucose-Capillary: 69 mg/dL — ABNORMAL LOW (ref 70–99)
Glucose-Capillary: 69 mg/dL — ABNORMAL LOW (ref 70–99)
Glucose-Capillary: 73 mg/dL (ref 70–99)
Glucose-Capillary: 76 mg/dL (ref 70–99)
Glucose-Capillary: 80 mg/dL (ref 70–99)
Glucose-Capillary: 80 mg/dL (ref 70–99)
Glucose-Capillary: 85 mg/dL (ref 70–99)
Glucose-Capillary: 89 mg/dL (ref 70–99)
Glucose-Capillary: 93 mg/dL (ref 70–99)
Glucose-Capillary: 96 mg/dL (ref 70–99)
Glucose-Capillary: 97 mg/dL (ref 70–99)
Glucose-Capillary: 97 mg/dL (ref 70–99)
Glucose-Capillary: 99 mg/dL (ref 70–99)

## 2011-03-16 LAB — BASIC METABOLIC PANEL
BUN: 44 mg/dL — ABNORMAL HIGH (ref 6–23)
BUN: 13 mg/dL (ref 6–23)
BUN: 18 mg/dL (ref 6–23)
BUN: 32 mg/dL — ABNORMAL HIGH (ref 6–23)
CO2: 25 mEq/L (ref 19–32)
Calcium: 9.5 mg/dL (ref 8.4–10.5)
Calcium: 8.8 mg/dL (ref 8.4–10.5)
Chloride: 114 mEq/L — ABNORMAL HIGH (ref 96–112)
Creatinine, Ser: 1.84 mg/dL — ABNORMAL HIGH (ref 0.4–1.5)
Creatinine, Ser: 0.92 mg/dL (ref 0.4–1.5)
Creatinine, Ser: 1.03 mg/dL (ref 0.4–1.5)
GFR calc Af Amer: 49 mL/min — ABNORMAL LOW (ref >60)
GFR calc non Af Amer: >60 mL/min (ref >60)
Glucose, Bld: 67 mg/dL — ABNORMAL LOW (ref 70–99)
Glucose, Bld: 83 mg/dL (ref 70–99)
Potassium: 3.2 mEq/L — ABNORMAL LOW (ref 3.5–5.1)
Potassium: 4 mEq/L (ref 3.5–5.1)

## 2011-03-16 LAB — POCT I-STAT 3, ART BLOOD GAS (G3+)
pCO2 arterial: 23.4 mmHg — ABNORMAL LOW (ref 35.0–45.0)
pO2, Arterial: 56 mmHg — ABNORMAL LOW (ref 80.0–100.0)

## 2011-03-16 LAB — DIFFERENTIAL
Basophils Absolute: 0 10*3/uL (ref 0.0–0.1)
Basophils Absolute: 0 10*3/uL (ref 0.0–0.1)
Basophils Relative: 0 % (ref 0–1)
Basophils Relative: 0 % (ref 0–1)
Eosinophils Absolute: 0.2 10*3/uL (ref 0.0–0.7)
Eosinophils Absolute: 0.2 10*3/uL (ref 0.0–0.7)
Eosinophils Absolute: 0.4 10*3/uL (ref 0.0–0.7)
Eosinophils Relative: 1 % (ref 0–5)
Eosinophils Relative: 2 % (ref 0–5)
Eosinophils Relative: 3 % (ref 0–5)
Eosinophils Relative: 4 % (ref 0–5)
Lymphocytes Relative: 11 % — ABNORMAL LOW (ref 12–46)
Lymphocytes Relative: 13 % (ref 12–46)
Lymphs Abs: 1.9 10*3/uL (ref 0.7–4.0)
Lymphs Abs: 2 10*3/uL (ref 0.7–4.0)
Monocytes Absolute: 0.8 10*3/uL (ref 0.1–1.0)
Monocytes Absolute: 1.2 10*3/uL — ABNORMAL HIGH (ref 0.1–1.0)
Monocytes Absolute: 1.3 10*3/uL — ABNORMAL HIGH (ref 0.1–1.0)
Monocytes Relative: 7 % (ref 3–12)
Monocytes Relative: 9 % (ref 3–12)
Neutro Abs: 12 10*3/uL — ABNORMAL HIGH (ref 1.7–7.7)

## 2011-03-16 LAB — CULTURE, BLOOD (ROUTINE X 2)
Culture: NO GROWTH
Culture: NO GROWTH
Culture: NO GROWTH

## 2011-03-16 LAB — URINE CULTURE
Colony Count: 55000
Colony Count: NO GROWTH

## 2011-03-16 LAB — LACTIC ACID, PLASMA
Lactic Acid, Venous: 0.9 mmol/L (ref 0.5–2.2)
Lactic Acid, Venous: 1.6 mmol/L (ref 0.5–2.2)

## 2011-03-16 LAB — URINALYSIS, ROUTINE W REFLEX MICROSCOPIC
Glucose, UA: NEGATIVE mg/dL
Glucose, UA: NEGATIVE mg/dL
Ketones, ur: NEGATIVE mg/dL
Ketones, ur: 15 mg/dL — AB
Nitrite: NEGATIVE
Nitrite: NEGATIVE
Protein, ur: NEGATIVE mg/dL
Protein, ur: NEGATIVE mg/dL
Urobilinogen, UA: 1 mg/dL (ref 0.0–1.0)

## 2011-03-16 LAB — TSH
TSH: 0.016 u[IU]/mL — ABNORMAL LOW (ref 0.350–4.500)
TSH: 0.014 u[IU]/mL — ABNORMAL LOW (ref 0.350–4.500)

## 2011-03-16 LAB — TYPE AND SCREEN
ABO/RH(D): O POS
DAT, IgG: NEGATIVE

## 2011-03-16 LAB — CORTISOL: Cortisol, Plasma: 19.8 ug/dL

## 2011-03-16 LAB — URINE MICROSCOPIC-ADD ON

## 2011-03-16 LAB — POCT CARDIAC MARKERS
CKMB, poc: <1 ng/mL — ABNORMAL LOW (ref 1.0–8.0)
Myoglobin, poc: 213 ng/mL (ref 12–200)
Troponin i, poc: <0.05 ng/mL (ref 0.00–0.09)

## 2011-03-16 LAB — PHOSPHORUS: Phosphorus: 4.7 mg/dL — ABNORMAL HIGH (ref 2.3–4.6)

## 2011-03-16 LAB — MAGNESIUM
Magnesium: 1.6 mg/dL (ref 1.5–2.5)
Magnesium: 1.6 mg/dL (ref 1.5–2.5)

## 2011-03-16 LAB — APTT: aPTT: 57 seconds — ABNORMAL HIGH (ref 24–37)

## 2011-03-16 LAB — HEMOGLOBIN A1C
Hgb A1c MFr Bld: 5.1 % (ref 4.6–6.1)
Mean Plasma Glucose: 100 mg/dL

## 2011-03-16 LAB — VANCOMYCIN, TROUGH: Vancomycin Tr: 29.8 ug/mL (ref 10.0–20.0)

## 2011-03-16 LAB — T4, FREE: Free T4: 1.54 ng/dL (ref 0.80–1.80)

## 2011-03-19 LAB — BASIC METABOLIC PANEL
BUN: 40 mg/dL — ABNORMAL HIGH (ref 6–23)
BUN: 47 mg/dL — ABNORMAL HIGH (ref 6–23)
CO2: 21 mEq/L (ref 19–32)
CO2: 27 mEq/L (ref 19–32)
Calcium: 9.1 mg/dL (ref 8.4–10.5)
Chloride: 106 mEq/L (ref 96–112)
Chloride: 112 mEq/L (ref 96–112)
Creatinine, Ser: 1.92 mg/dL — ABNORMAL HIGH (ref 0.4–1.5)
GFR calc non Af Amer: 39 mL/min — ABNORMAL LOW (ref >60)
Glucose, Bld: 87 mg/dL (ref 70–99)
Glucose, Bld: 88 mg/dL (ref 70–99)
Potassium: 4 mEq/L (ref 3.5–5.1)
Potassium: 4.1 mEq/L (ref 3.5–5.1)
Sodium: 135 mEq/L (ref 135–145)
Sodium: 138 mEq/L (ref 135–145)

## 2011-03-19 LAB — CBC
HCT: 27.3 % — ABNORMAL LOW (ref 39.0–52.0)
HCT: 29 % — ABNORMAL LOW (ref 39.0–52.0)
Hemoglobin: 10.3 g/dL — ABNORMAL LOW (ref 13.0–17.0)
Hemoglobin: 9.9 g/dL — ABNORMAL LOW (ref 13.0–17.0)
MCHC: 34.3 g/dL (ref 30.0–36.0)
MCHC: 34.8 g/dL (ref 30.0–36.0)
MCV: 84 fL (ref 78.0–100.0)
MCV: 84.1 fL (ref 78.0–100.0)
Platelets: 137 10*3/uL — ABNORMAL LOW (ref 150–400)
Platelets: 152 10*3/uL (ref 150–400)
RDW: 14.3 % (ref 11.5–15.5)
RDW: 15.7 % — ABNORMAL HIGH (ref 11.5–15.5)
WBC: 8.7 10*3/uL (ref 4.0–10.5)

## 2011-03-19 LAB — GLUCOSE, CAPILLARY
Glucose-Capillary: 102 mg/dL — ABNORMAL HIGH (ref 70–99)
Glucose-Capillary: 107 mg/dL — ABNORMAL HIGH (ref 70–99)
Glucose-Capillary: 110 mg/dL — ABNORMAL HIGH (ref 70–99)
Glucose-Capillary: 127 mg/dL — ABNORMAL HIGH (ref 70–99)
Glucose-Capillary: 69 mg/dL — ABNORMAL LOW (ref 70–99)
Glucose-Capillary: 78 mg/dL (ref 70–99)
Glucose-Capillary: 90 mg/dL (ref 70–99)
Glucose-Capillary: 93 mg/dL (ref 70–99)
Glucose-Capillary: 97 mg/dL (ref 70–99)

## 2011-03-19 LAB — DIFFERENTIAL
Basophils Absolute: 0.1 10*3/uL (ref 0.0–0.1)
Basophils Relative: 1 % (ref 0–1)
Lymphocytes Relative: 19 % (ref 12–46)
Monocytes Absolute: 1.3 10*3/uL — ABNORMAL HIGH (ref 0.1–1.0)
Neutro Abs: 9.4 10*3/uL — ABNORMAL HIGH (ref 1.7–7.7)
Neutrophils Relative %: 71 % (ref 43–77)

## 2011-03-19 LAB — POCT CARDIAC MARKERS
CKMB, poc: 2 ng/mL (ref 1.0–8.0)
Troponin i, poc: <0.05 ng/mL (ref 0.00–0.09)

## 2011-03-19 LAB — RAPID URINE DRUG SCREEN, HOSP PERFORMED
Amphetamines: NOT DETECTED
Barbiturates: NOT DETECTED
Cocaine: NOT DETECTED
Opiates: POSITIVE — AB

## 2011-03-19 LAB — PROTIME-INR
INR: 1.8 — ABNORMAL HIGH (ref 0.00–1.49)
Prothrombin Time: 20.7 seconds — ABNORMAL HIGH (ref 11.6–15.2)

## 2011-03-19 LAB — URINALYSIS, ROUTINE W REFLEX MICROSCOPIC
Bilirubin Urine: NEGATIVE
Nitrite: POSITIVE — AB
Specific Gravity, Urine: 1.012 (ref 1.005–1.030)
Urobilinogen, UA: 0.2 mg/dL (ref 0.0–1.0)
pH: 6 (ref 5.0–8.0)

## 2011-03-19 LAB — URINE MICROSCOPIC-ADD ON

## 2011-03-22 IMAGING — CR DG CHEST 1V PORT
1 series · 1 of 1 positions shown · non-contrast
Comparison: 01/31/2011 into [DATE].

CLINICAL DATA: Vomiting.  Weakness.

PORTABLE CHEST - 1 VIEW

[AP]
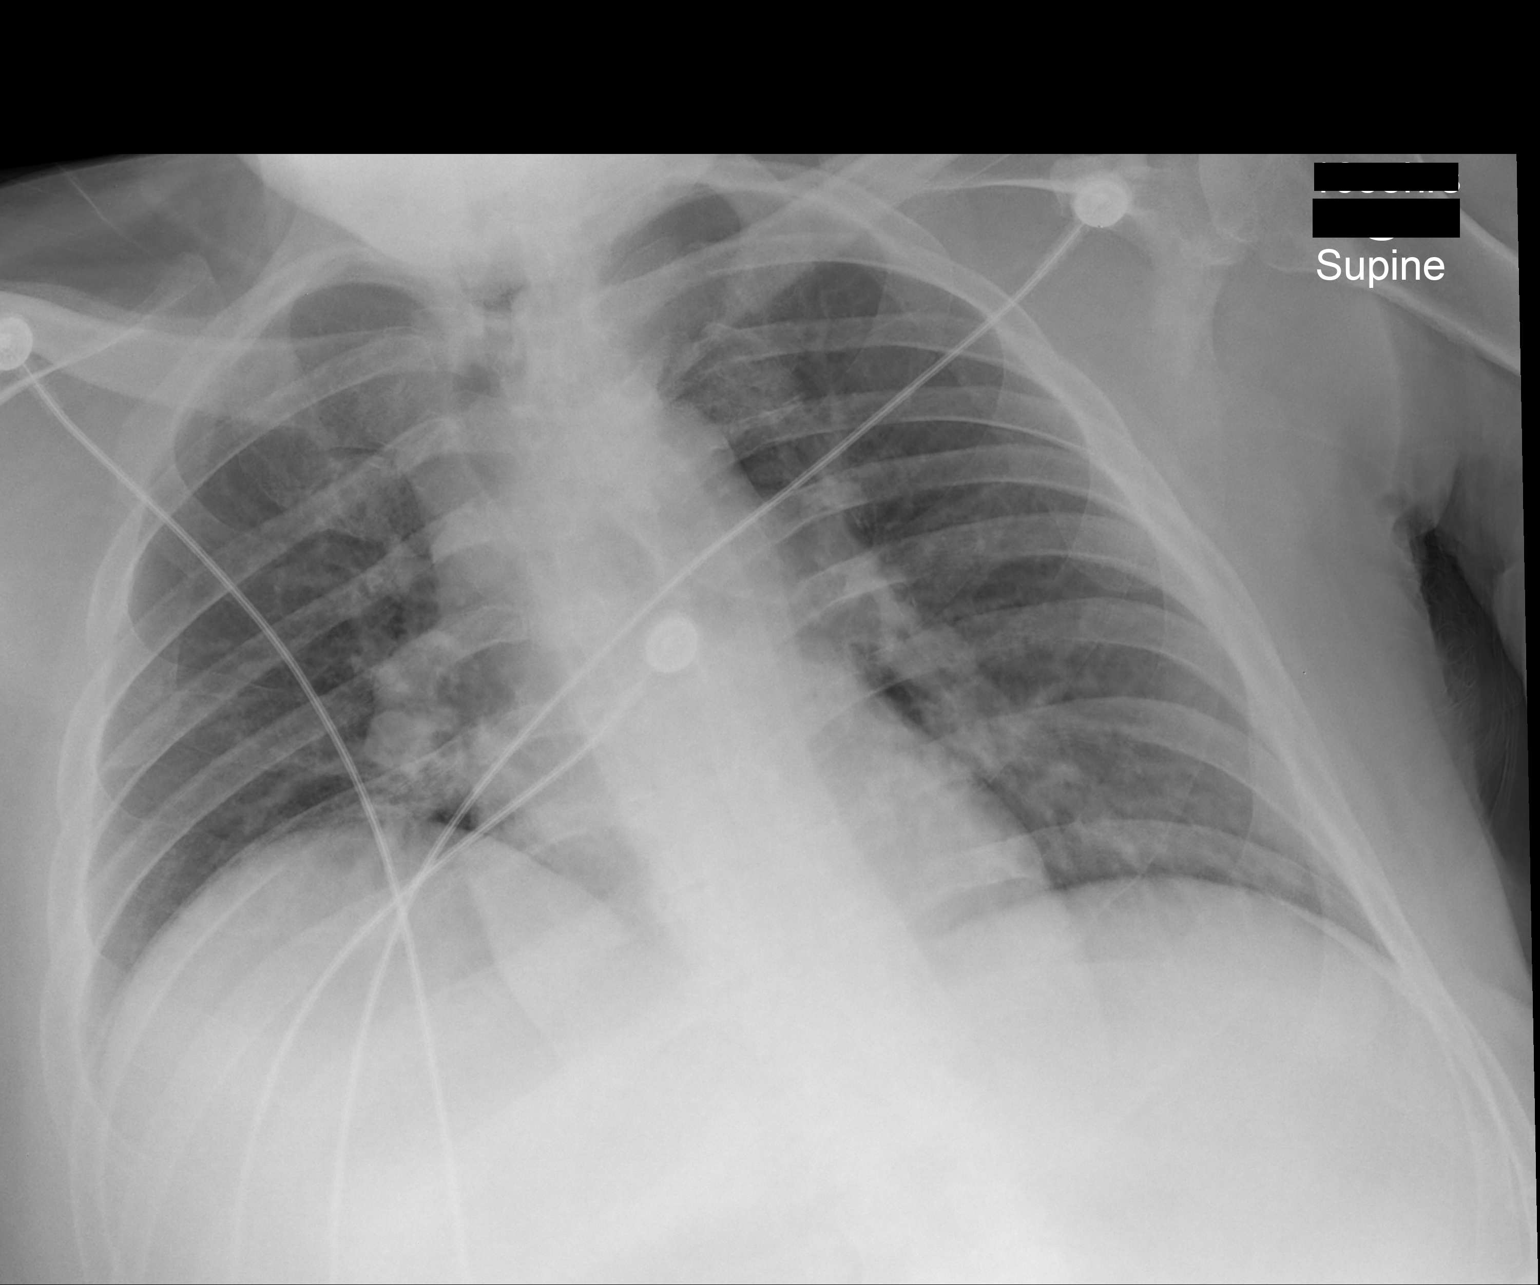

[1 of 1 positions shown; findings below may reference images not displayed]

FINDINGS: 5344 hours.  There is chronic elevation of the right
hemidiaphragm with interval improved right basilar aeration.  No
edema, confluent airspace opacity or pleural effusion is
identified.  Heart size and mediastinal contours are stable.
IMPRESSION: Interval improved right basilar aeration with chronic elevation of
the right hemidiaphragm.  No acute findings demonstrated.

## 2011-03-23 LAB — CULTURE, BLOOD (ROUTINE X 2)

## 2011-03-23 LAB — HEMOGLOBIN A1C
Hgb A1c MFr Bld: 4.7 % (ref 4.6–6.1)
Mean Plasma Glucose: 88 mg/dL

## 2011-03-23 LAB — RAPID URINE DRUG SCREEN, HOSP PERFORMED
Amphetamines: NOT DETECTED
Benzodiazepines: POSITIVE — AB
Cocaine: NOT DETECTED
Tetrahydrocannabinol: NOT DETECTED

## 2011-03-23 LAB — CBC
HCT: 28.8 % — ABNORMAL LOW (ref 39.0–52.0)
HCT: 29.6 % — ABNORMAL LOW (ref 39.0–52.0)
HCT: 32.9 % — ABNORMAL LOW (ref 39.0–52.0)
Hemoglobin: 11.2 g/dL — ABNORMAL LOW (ref 13.0–17.0)
MCHC: 34.1 g/dL (ref 30.0–36.0)
MCHC: 34.2 g/dL (ref 30.0–36.0)
MCHC: 34.2 g/dL (ref 30.0–36.0)
MCV: 84.6 fL (ref 78.0–100.0)
MCV: 85.6 fL (ref 78.0–100.0)
MCV: 86.1 fL (ref 78.0–100.0)
MCV: 86.3 fL (ref 78.0–100.0)
Platelets: 146 10*3/uL — ABNORMAL LOW (ref 150–400)
Platelets: 156 10*3/uL (ref 150–400)
Platelets: 200 10*3/uL (ref 150–400)
RBC: 3.76 MIL/uL — ABNORMAL LOW (ref 4.22–5.81)
RBC: 3.84 MIL/uL — ABNORMAL LOW (ref 4.22–5.81)
RDW: 14.2 % (ref 11.5–15.5)
RDW: 14.6 % (ref 11.5–15.5)
WBC: 10.5 10*3/uL (ref 4.0–10.5)
WBC: 7.8 10*3/uL (ref 4.0–10.5)
WBC: 8.4 10*3/uL (ref 4.0–10.5)
WBC: 9.1 10*3/uL (ref 4.0–10.5)

## 2011-03-23 LAB — BASIC METABOLIC PANEL
BUN: 25 mg/dL — ABNORMAL HIGH (ref 6–23)
BUN: 28 mg/dL — ABNORMAL HIGH (ref 6–23)
BUN: 31 mg/dL — ABNORMAL HIGH (ref 6–23)
Calcium: 9.4 mg/dL (ref 8.4–10.5)
Chloride: 103 mEq/L (ref 96–112)
Chloride: 109 mEq/L (ref 96–112)
Creatinine, Ser: 1.49 mg/dL (ref 0.4–1.5)
Creatinine, Ser: 1.54 mg/dL — ABNORMAL HIGH (ref 0.4–1.5)
GFR calc Af Amer: >60 mL/min (ref >60)
GFR calc non Af Amer: 50 mL/min — ABNORMAL LOW (ref >60)
GFR calc non Af Amer: 52 mL/min — ABNORMAL LOW (ref >60)
Glucose, Bld: 88 mg/dL (ref 70–99)
Potassium: 3.4 mEq/L — ABNORMAL LOW (ref 3.5–5.1)

## 2011-03-23 LAB — GLUCOSE, CAPILLARY
Glucose-Capillary: 107 mg/dL — ABNORMAL HIGH (ref 70–99)
Glucose-Capillary: 108 mg/dL — ABNORMAL HIGH (ref 70–99)
Glucose-Capillary: 108 mg/dL — ABNORMAL HIGH (ref 70–99)
Glucose-Capillary: 113 mg/dL — ABNORMAL HIGH (ref 70–99)
Glucose-Capillary: 75 mg/dL (ref 70–99)
Glucose-Capillary: 84 mg/dL (ref 70–99)
Glucose-Capillary: 86 mg/dL (ref 70–99)
Glucose-Capillary: 90 mg/dL (ref 70–99)
Glucose-Capillary: 90 mg/dL (ref 70–99)
Glucose-Capillary: 94 mg/dL (ref 70–99)

## 2011-03-23 LAB — URINE CULTURE
Colony Count: NO GROWTH
Culture: NO GROWTH

## 2011-03-23 LAB — HEPATIC FUNCTION PANEL
AST: 40 U/L — ABNORMAL HIGH (ref 0–37)
Bilirubin, Direct: 0.3 mg/dL (ref 0.0–0.3)
Indirect Bilirubin: 0.7 mg/dL (ref 0.3–0.9)

## 2011-03-23 LAB — COMPREHENSIVE METABOLIC PANEL
ALT: 20 U/L (ref 0–53)
AST: 28 U/L (ref 0–37)
Albumin: 3.2 g/dL — ABNORMAL LOW (ref 3.5–5.2)
Alkaline Phosphatase: 78 U/L (ref 39–117)
BUN: 28 mg/dL — ABNORMAL HIGH (ref 6–23)
CO2: 22 mEq/L (ref 19–32)
Calcium: 9 mg/dL (ref 8.4–10.5)
Chloride: 104 mEq/L (ref 96–112)
Chloride: 114 mEq/L — ABNORMAL HIGH (ref 96–112)
Creatinine, Ser: 1.28 mg/dL (ref 0.4–1.5)
GFR calc Af Amer: 58 mL/min — ABNORMAL LOW (ref >60)
GFR calc non Af Amer: 48 mL/min — ABNORMAL LOW (ref >60)
Glucose, Bld: 112 mg/dL — ABNORMAL HIGH (ref 70–99)
Potassium: 4.5 mEq/L (ref 3.5–5.1)
Sodium: 138 mEq/L (ref 135–145)
Total Bilirubin: 0.4 mg/dL (ref 0.3–1.2)
Total Bilirubin: 0.8 mg/dL (ref 0.3–1.2)

## 2011-03-23 LAB — TSH: TSH: 0.011 u[IU]/mL — ABNORMAL LOW (ref 0.350–4.500)

## 2011-03-23 LAB — DIFFERENTIAL
Basophils Relative: 1 % (ref 0–1)
Eosinophils Absolute: 0.1 10*3/uL (ref 0.0–0.7)
Eosinophils Relative: 1 % (ref 0–5)
Eosinophils Relative: 2 % (ref 0–5)
Lymphs Abs: 2.5 10*3/uL (ref 0.7–4.0)
Monocytes Absolute: 0.4 10*3/uL (ref 0.1–1.0)
Neutrophils Relative %: 69 % (ref 43–77)
Neutrophils Relative %: 71 % (ref 43–77)

## 2011-03-23 LAB — DIGOXIN LEVEL: Digoxin Level: 2.2 ng/mL — ABNORMAL HIGH (ref 0.8–2.0)

## 2011-03-23 LAB — URINALYSIS, ROUTINE W REFLEX MICROSCOPIC
Protein, ur: NEGATIVE mg/dL
Specific Gravity, Urine: 1.016 (ref 1.005–1.030)
Urobilinogen, UA: 0.2 mg/dL (ref 0.0–1.0)

## 2011-03-23 LAB — PROTIME-INR
INR: 1.43 (ref 0.00–1.49)
INR: 1.96 — ABNORMAL HIGH (ref 0.00–1.49)
Prothrombin Time: 16.8 seconds — ABNORMAL HIGH (ref 11.6–15.2)

## 2011-03-23 LAB — CORTISOL-PM, BLOOD: Cortisol - PM: 4.8 ug/dL (ref 3.1–16.7)

## 2011-03-23 LAB — ETHANOL: Alcohol, Ethyl (B): <5 mg/dL (ref 0–10)

## 2011-03-23 LAB — TROPONIN I: Troponin I: 0.03 ng/mL (ref 0.00–0.06)

## 2011-03-23 LAB — CK TOTAL AND CKMB (NOT AT ARMC): Relative Index: INVALID (ref 0.0–2.5)

## 2011-03-23 LAB — URINE MICROSCOPIC-ADD ON

## 2011-03-23 LAB — BRAIN NATRIURETIC PEPTIDE: Pro B Natriuretic peptide (BNP): 39 pg/mL (ref 0.0–100.0)

## 2011-03-23 IMAGING — CT CT ABD-PELV W/O CM
2 of 4 series · 17 of 46 positions shown, 19 images · non-contrast
Comparison: 01/26/2011

CLINICAL DATA: Multiple medical problems including adrenal
deficiency, groin hematoma, recurrent UTIs, anemia, septic shock,
pneumonia, and anoxic brain injury.  Evaluate for obstruction.

CT ABDOMEN AND PELVIS WITHOUT CONTRAST
TECHNIQUE: Multidetector CT imaging of the abdomen and pelvis was
performed following the standard protocol without intravenous
contrast.

[Series 2: rtn a/p w/o · axial · non-contrast · 0.93mm/px · z∈[-552,-16]mm · 14 of 117 slices shown, 16 images]
[im 5/117  soft-tissue]
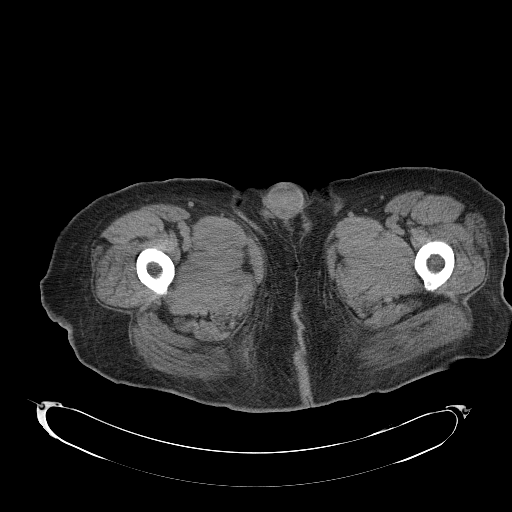
[im 5/117  bone]
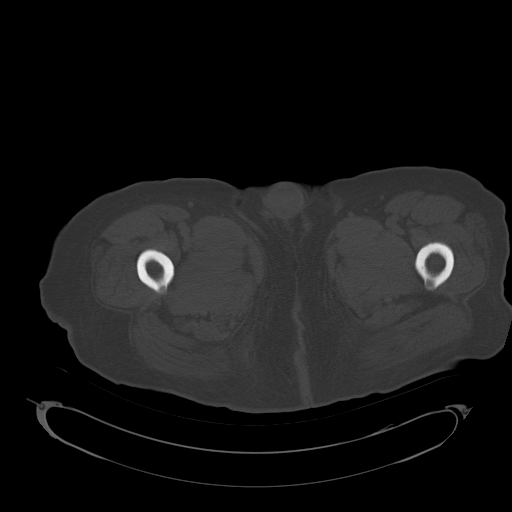
[im 15/117  soft-tissue]
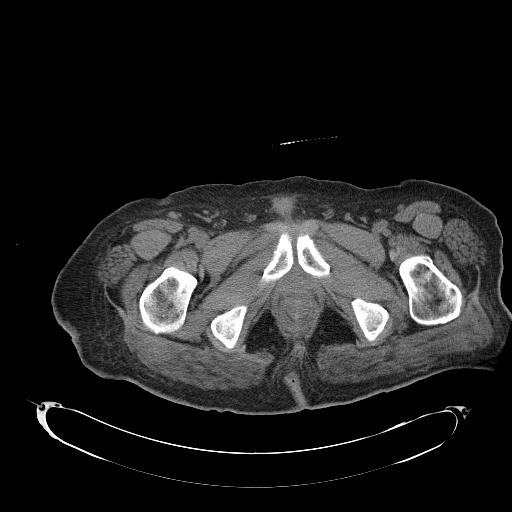
[im 25/117  soft-tissue]
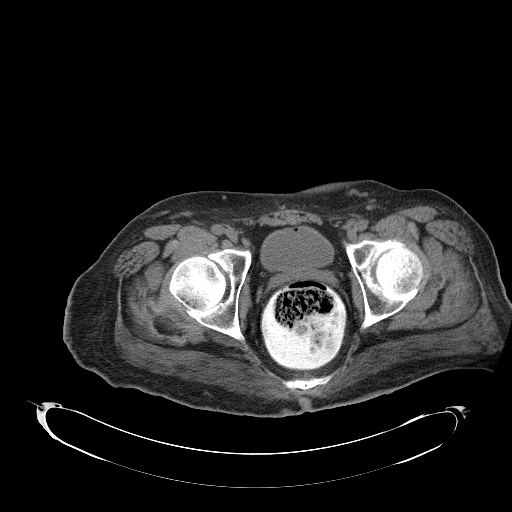
[im 30/117  soft-tissue]
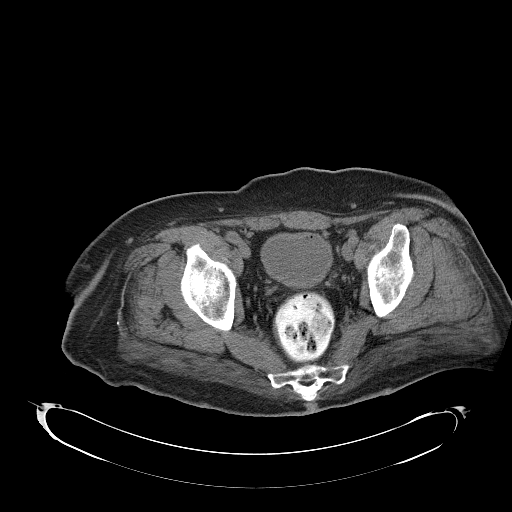
[im 39/117  soft-tissue]
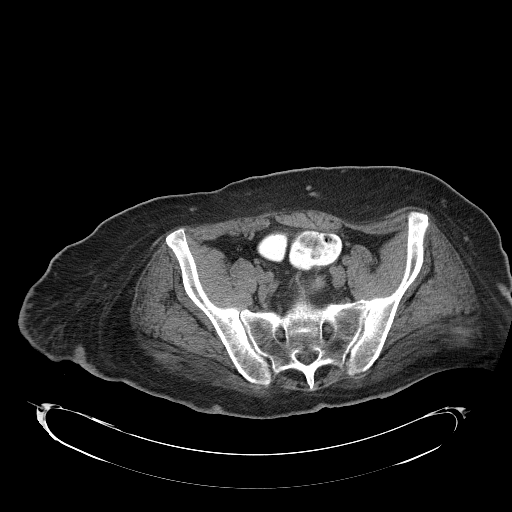
[im 49/117  soft-tissue]
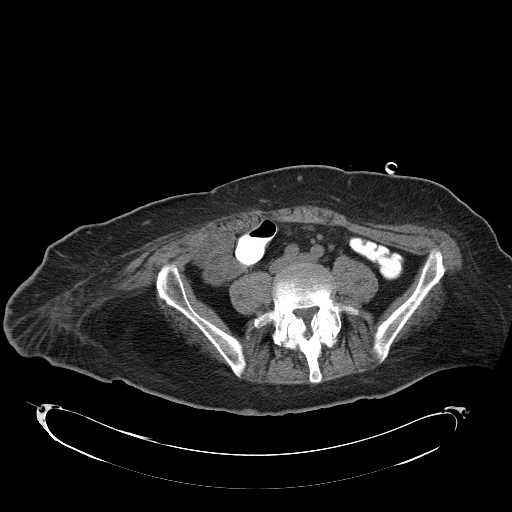
[im 54/117  soft-tissue]
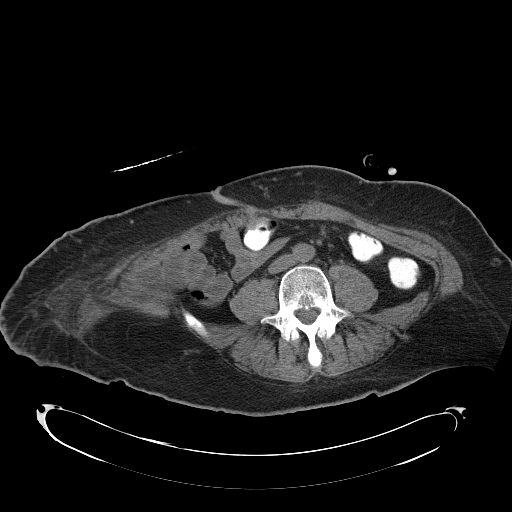
[im 63/117  soft-tissue]
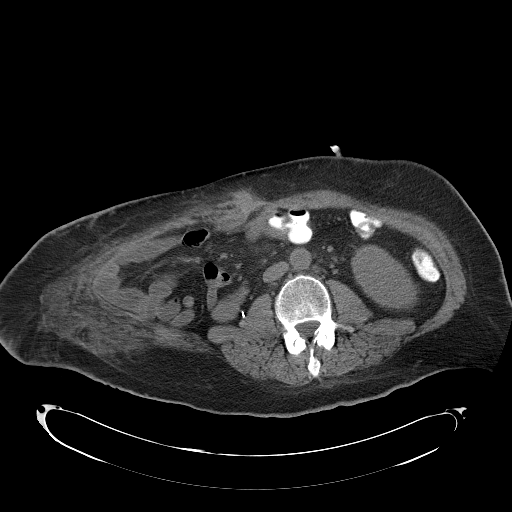
[im 68/117  soft-tissue]
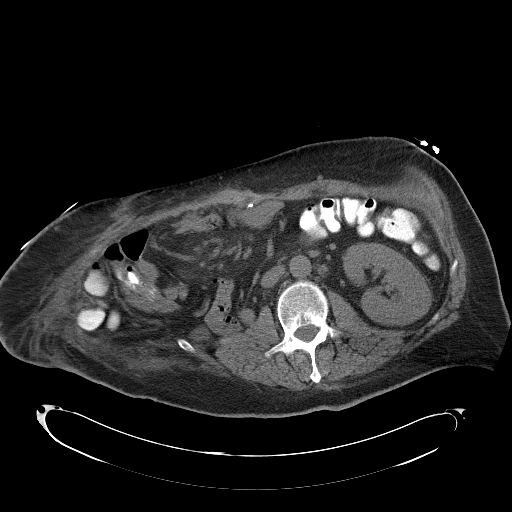
[im 68/117  bone]
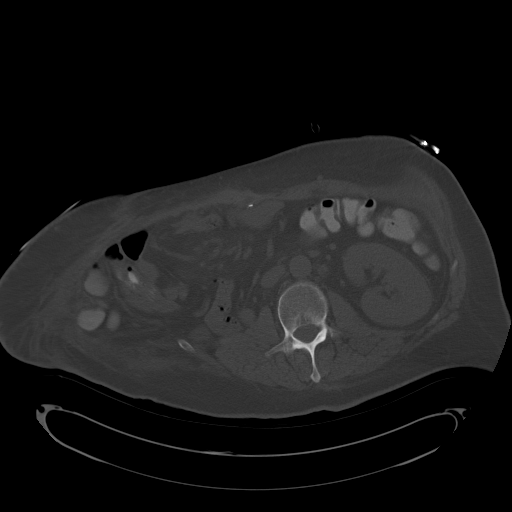
[im 78/117  soft-tissue]
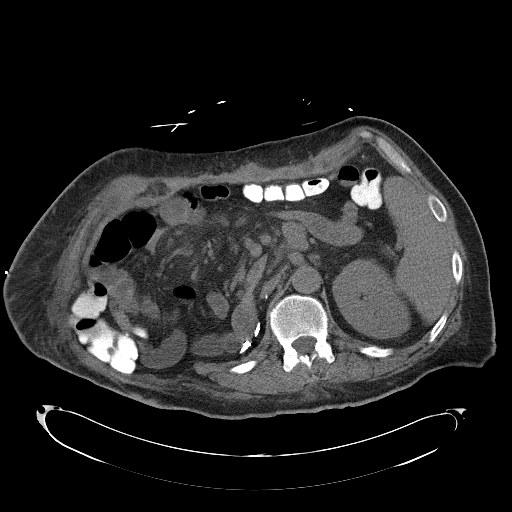
[im 88/117  soft-tissue]
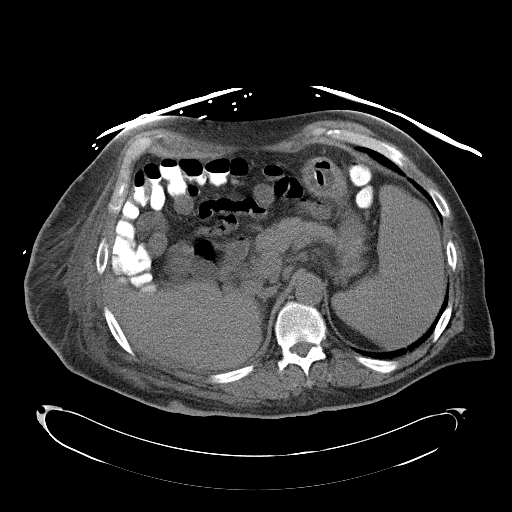
[im 92/117  soft-tissue]
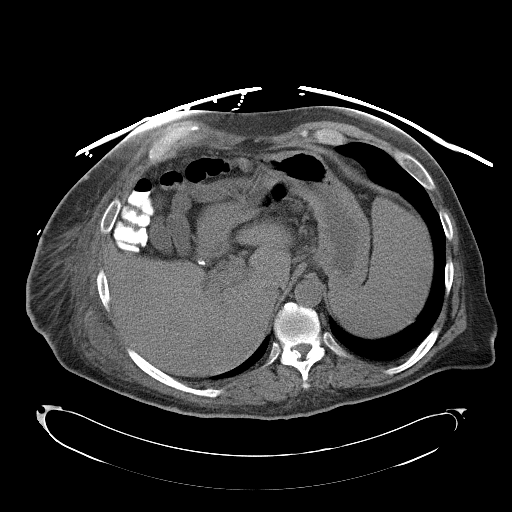
[im 102/117  soft-tissue]
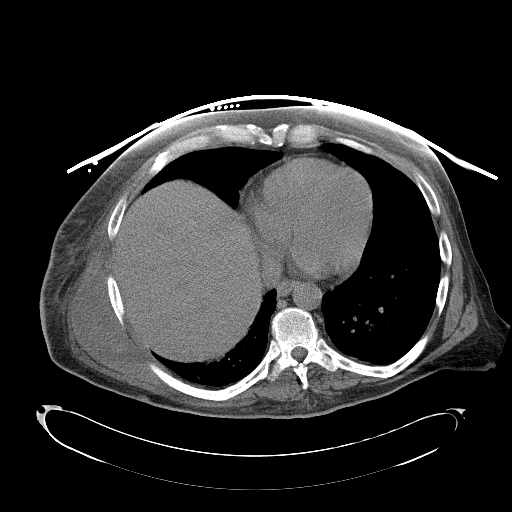
[im 112/117  soft-tissue]
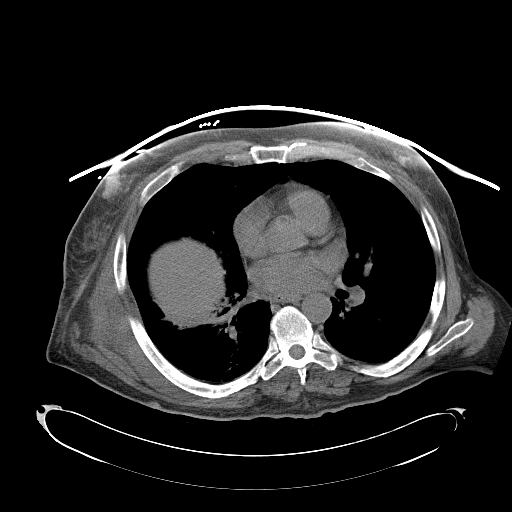

[Series 602: <mpr thick range> · coronal · 1.14mm/px · 3 of 99 slices shown]
[im 33/99  soft-tissue]
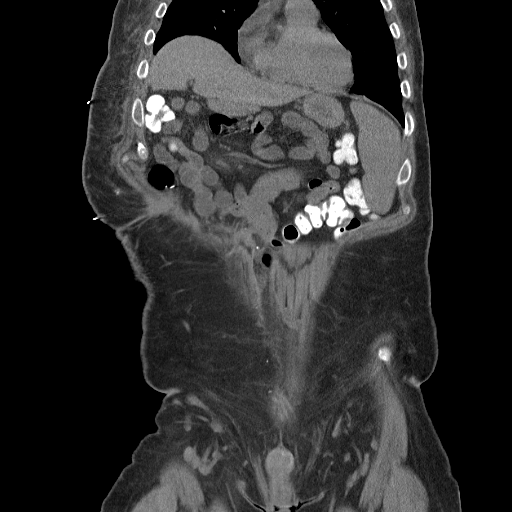
[im 44/99  soft-tissue]
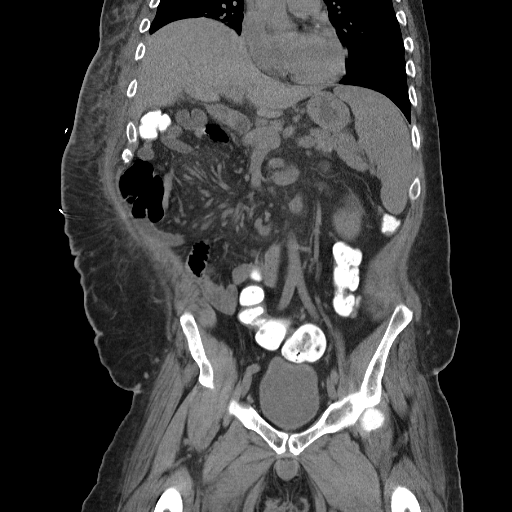
[im 55/99  soft-tissue]
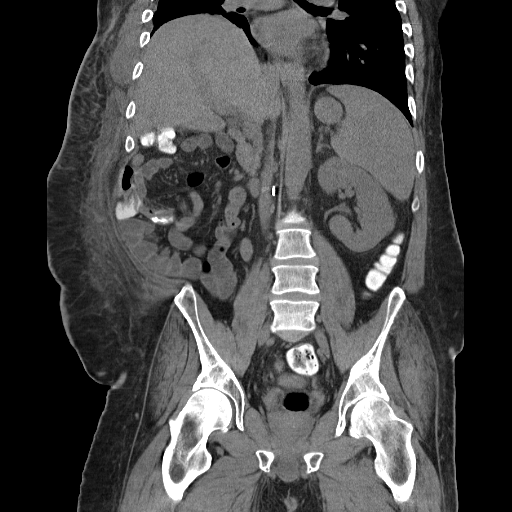

[17 of 46 positions shown; findings below may reference images not displayed]

FINDINGS: Patchy/tree-in-bud opacities in the bilateral lower
lobes.

The liver, pancreas, and adrenal glands are grossly unremarkable.
Splenomegaly, measuring at least 15.4 cm in craniocaudal dimension.

Status post cholecystectomy with retained stones in the surgical
bed.

Right kidney is surgically absent.  No abnormal soft tissue in the
surgical bed.  Left kidney is grossly unremarkable, without
hydronephrosis.

Gastrostomy tube.  No evidence of obstruction.  Retained contrast
in the colon.

No abdominopelvic ascites.

Scattered small mesenteric/retroperitoneal lymph nodes,
nonspecific.

Bladder is notable for a few tiny foci of nondependent gas.

Prostate is unremarkable.

Stranding along the right right lateral abdominal wall, with near
complete resolution of prior hematoma.  Hematoma involving the
adductor muscles of the right thigh is not visualized and may have
resolved.  Mild degenerate changes of the lumbar spine with
spondylolysis at L5-S1.
IMPRESSION: No evidence of bowel obstruction.  Retained contrast in the colon.

Patchy/tree-in-bud opacities in the bilateral lower lobes,
incompletely visualized, likely infectious and/or on the basis of
aspiration.

Near complete resolution of prior right lateral abdominal wall
hematoma.  Prior right thigh hematoma is not visualized and may
resolved.

Status post right nephrectomy.  No abnormal soft tissue in the
surgical bed.

## 2011-03-24 IMAGING — CR DG CHEST 1V PORT
1 series · 1 of 1 positions shown · non-contrast
Comparison: 02/17/2011

CLINICAL DATA: PICC line placement

PORTABLE CHEST - 1 VIEW

[AP]
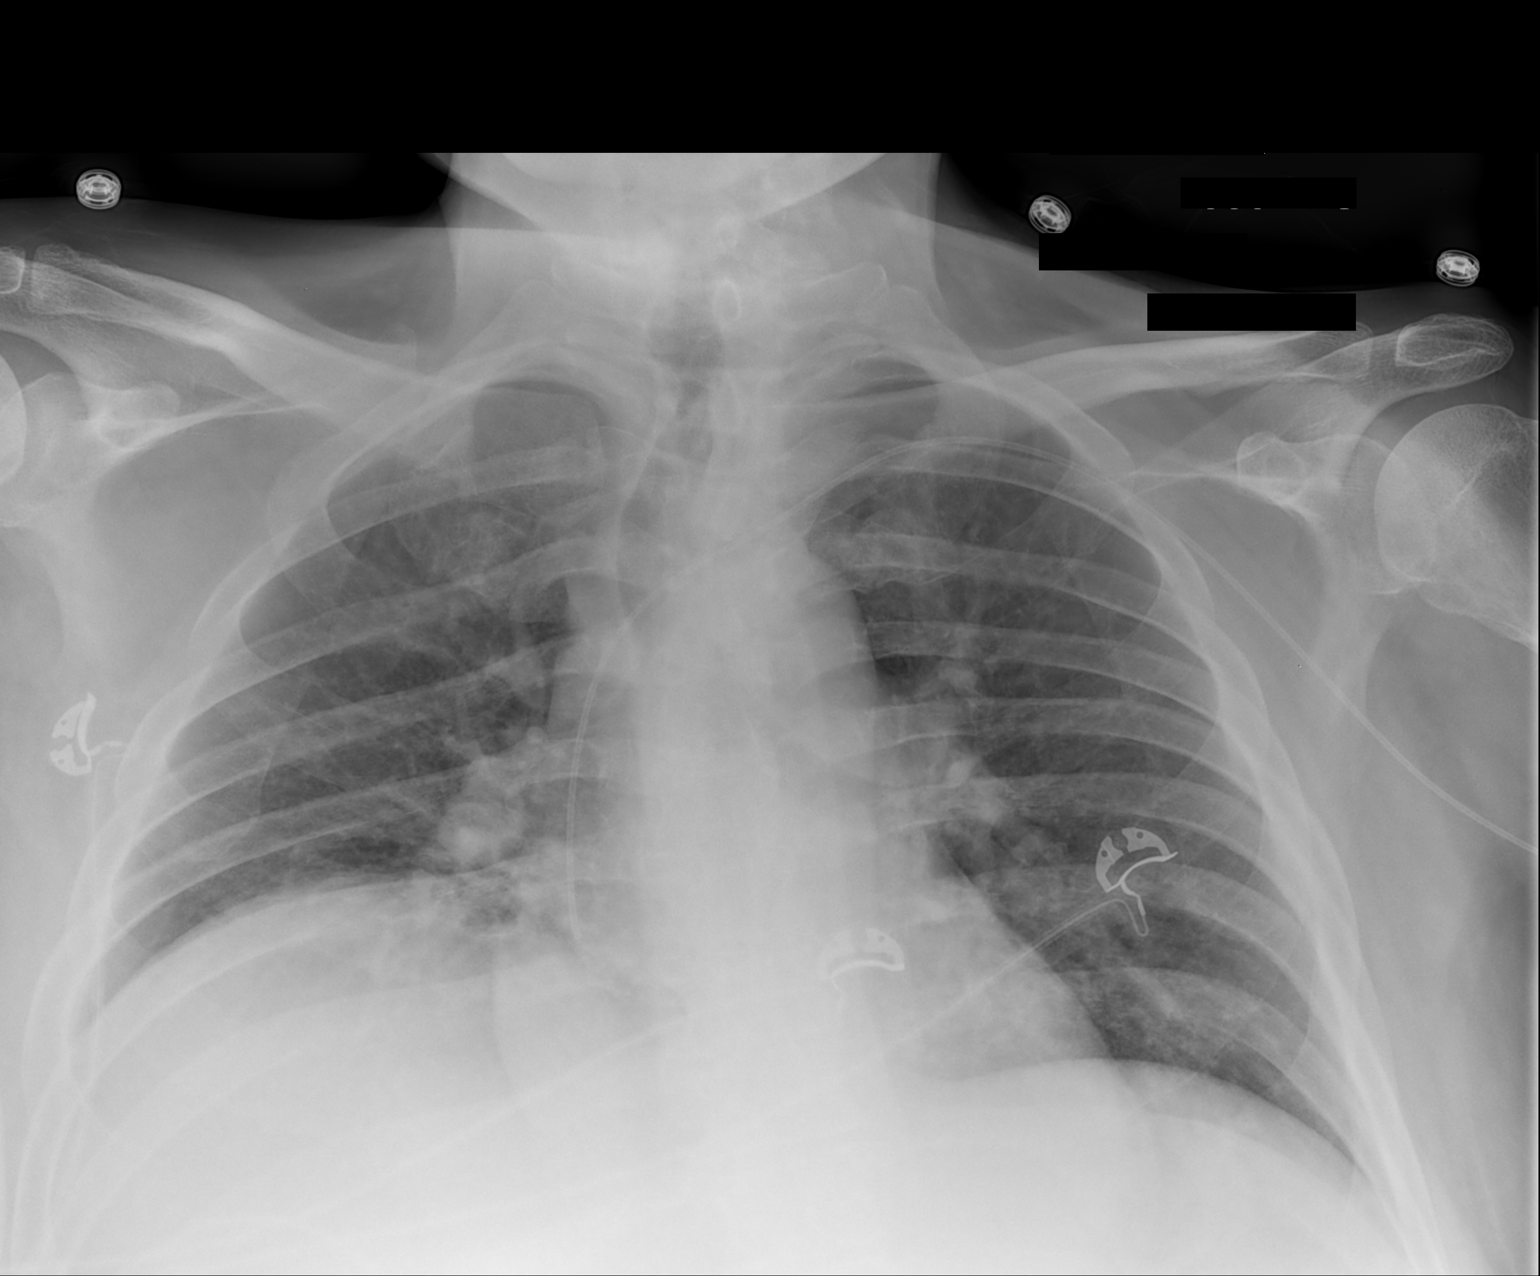

[1 of 1 positions shown; findings below may reference images not displayed]

FINDINGS: Interval placement of a left axillary PICC with its tip
at the cavoatrial junction.  No pneumothorax.

Low lung volumes with resulting vascular crowding.  Insert no
effusion stable elevation of the right hemidiaphragm.

Cardiomediastinal silhouette is unchanged.
IMPRESSION: Interval placement of a left axillary PICC with its tip at the
cavoatrial junction.  No pneumothorax.

## 2011-03-30 LAB — CBC
Hemoglobin: 12.7 g/dL — ABNORMAL LOW (ref 13.0–17.0)
MCHC: 33.4 g/dL (ref 30.0–36.0)
MCV: 81.9 fL (ref 78.0–100.0)
RBC: 4.63 MIL/uL (ref 4.22–5.81)

## 2011-03-30 LAB — DIFFERENTIAL
Basophils Relative: 0 % (ref 0–1)
Monocytes Absolute: 0.8 10*3/uL (ref 0.1–1.0)
Monocytes Relative: 7 % (ref 3–12)
Neutro Abs: 8.3 10*3/uL — ABNORMAL HIGH (ref 1.7–7.7)

## 2011-03-30 LAB — BASIC METABOLIC PANEL
CO2: 26 mEq/L (ref 19–32)
Calcium: 10 mg/dL (ref 8.4–10.5)
Chloride: 100 mEq/L (ref 96–112)
GFR calc Af Amer: >60 mL/min (ref >60)
Sodium: 135 mEq/L (ref 135–145)

## 2011-03-30 LAB — POCT I-STAT, CHEM 8
BUN: 45 mg/dL — ABNORMAL HIGH (ref 6–23)
Chloride: 109 mEq/L (ref 96–112)
Creatinine, Ser: 1.5 mg/dL (ref 0.4–1.5)
Sodium: 134 mEq/L — ABNORMAL LOW (ref 135–145)
TCO2: 24 mmol/L (ref 0–100)

## 2011-03-31 LAB — COMPREHENSIVE METABOLIC PANEL
AST: 19 U/L (ref 0–37)
Albumin: 2.8 g/dL — ABNORMAL LOW (ref 3.5–5.2)
Calcium: 8.6 mg/dL (ref 8.4–10.5)
Chloride: 112 mEq/L (ref 96–112)
Creatinine, Ser: 0.52 mg/dL (ref 0.4–1.5)
GFR calc Af Amer: >60 mL/min (ref >60)
Total Protein: 5.3 g/dL — ABNORMAL LOW (ref 6.0–8.3)

## 2011-03-31 LAB — GLUCOSE, CAPILLARY

## 2011-03-31 LAB — CBC
MCHC: 32.9 g/dL (ref 30.0–36.0)
MCV: 83.3 fL (ref 78.0–100.0)
Platelets: 128 10*3/uL — ABNORMAL LOW (ref 150–400)
RDW: 17 % — ABNORMAL HIGH (ref 11.5–15.5)
WBC: 7.6 10*3/uL (ref 4.0–10.5)

## 2011-03-31 LAB — DIFFERENTIAL
Eosinophils Relative: 0 % (ref 0–5)
Lymphocytes Relative: 26 % (ref 12–46)
Lymphs Abs: 2 10*3/uL (ref 0.7–4.0)
Monocytes Absolute: 0.5 10*3/uL (ref 0.1–1.0)
Neutro Abs: 5 10*3/uL (ref 1.7–7.7)

## 2011-03-31 LAB — PROTIME-INR: Prothrombin Time: 24.7 seconds — ABNORMAL HIGH (ref 11.6–15.2)

## 2011-04-01 LAB — DIFFERENTIAL
Basophils Absolute: 0 10*3/uL (ref 0.0–0.1)
Basophils Absolute: 0 10*3/uL (ref 0.0–0.1)
Basophils Absolute: 0 10*3/uL (ref 0.0–0.1)
Basophils Absolute: 0 10*3/uL (ref 0.0–0.1)
Basophils Absolute: 0.1 10*3/uL (ref 0.0–0.1)
Basophils Relative: 0 % (ref 0–1)
Basophils Relative: 0 % (ref 0–1)
Basophils Relative: 0 % (ref 0–1)
Basophils Relative: 1 % (ref 0–1)
Eosinophils Absolute: 0 10*3/uL (ref 0.0–0.7)
Eosinophils Absolute: 0 10*3/uL (ref 0.0–0.7)
Eosinophils Absolute: 0 10*3/uL (ref 0.0–0.7)
Eosinophils Absolute: 0 10*3/uL (ref 0.0–0.7)
Eosinophils Absolute: 0 10*3/uL (ref 0.0–0.7)
Eosinophils Relative: 0 % (ref 0–5)
Eosinophils Relative: 0 % (ref 0–5)
Eosinophils Relative: 0 % (ref 0–5)
Eosinophils Relative: 0 % (ref 0–5)
Eosinophils Relative: 0 % (ref 0–5)
Eosinophils Relative: 0 % (ref 0–5)
Eosinophils Relative: 0 % (ref 0–5)
Lymphocytes Relative: 12 % (ref 12–46)
Lymphocytes Relative: 15 % (ref 12–46)
Lymphocytes Relative: 16 % (ref 12–46)
Lymphocytes Relative: 26 % (ref 12–46)
Lymphs Abs: 1.4 10*3/uL (ref 0.7–4.0)
Lymphs Abs: 1.7 10*3/uL (ref 0.7–4.0)
Lymphs Abs: 2.8 10*3/uL (ref 0.7–4.0)
Monocytes Absolute: 0.4 10*3/uL (ref 0.1–1.0)
Monocytes Absolute: 0.4 10*3/uL (ref 0.1–1.0)
Monocytes Absolute: 0.6 10*3/uL (ref 0.1–1.0)
Monocytes Absolute: 1.6 10*3/uL — ABNORMAL HIGH (ref 0.1–1.0)
Monocytes Relative: 6 % (ref 3–12)
Monocytes Relative: 6 % (ref 3–12)
Monocytes Relative: 7 % (ref 3–12)
Neutro Abs: 4.9 10*3/uL (ref 1.7–7.7)
Neutro Abs: 5.4 10*3/uL (ref 1.7–7.7)
Neutro Abs: 5.6 10*3/uL (ref 1.7–7.7)
Neutrophils Relative %: 67 % (ref 43–77)
Neutrophils Relative %: 86 % — ABNORMAL HIGH (ref 43–77)

## 2011-04-01 LAB — COMPREHENSIVE METABOLIC PANEL
ALT: 26 U/L (ref 0–53)
ALT: 57 U/L — ABNORMAL HIGH (ref 0–53)
ALT: 61 U/L — ABNORMAL HIGH (ref 0–53)
ALT: 69 U/L — ABNORMAL HIGH (ref 0–53)
ALT: 79 U/L — ABNORMAL HIGH (ref 0–53)
ALT: 79 U/L — ABNORMAL HIGH (ref 0–53)
AST: 18 U/L (ref 0–37)
AST: 25 U/L (ref 0–37)
AST: 35 U/L (ref 0–37)
AST: 38 U/L — ABNORMAL HIGH (ref 0–37)
AST: 60 U/L — ABNORMAL HIGH (ref 0–37)
AST: 65 U/L — ABNORMAL HIGH (ref 0–37)
Albumin: 2.7 g/dL — ABNORMAL LOW (ref 3.5–5.2)
Albumin: 3 g/dL — ABNORMAL LOW (ref 3.5–5.2)
Albumin: 3 g/dL — ABNORMAL LOW (ref 3.5–5.2)
Alkaline Phosphatase: 52 U/L (ref 39–117)
Alkaline Phosphatase: 54 U/L (ref 39–117)
Alkaline Phosphatase: 57 U/L (ref 39–117)
Alkaline Phosphatase: 58 U/L (ref 39–117)
Alkaline Phosphatase: 58 U/L (ref 39–117)
Alkaline Phosphatase: 63 U/L (ref 39–117)
Alkaline Phosphatase: 64 U/L (ref 39–117)
BUN: 110 mg/dL — ABNORMAL HIGH (ref 6–23)
BUN: 18 mg/dL (ref 6–23)
BUN: 19 mg/dL (ref 6–23)
BUN: 19 mg/dL (ref 6–23)
BUN: 22 mg/dL (ref 6–23)
BUN: 54 mg/dL — ABNORMAL HIGH (ref 6–23)
CO2: 26 mEq/L (ref 19–32)
CO2: 26 mEq/L (ref 19–32)
CO2: 28 mEq/L (ref 19–32)
CO2: 31 mEq/L (ref 19–32)
CO2: 31 mEq/L (ref 19–32)
CO2: 31 mEq/L (ref 19–32)
CO2: 32 mEq/L (ref 19–32)
CO2: 33 mEq/L — ABNORMAL HIGH (ref 19–32)
Calcium: 8.8 mg/dL (ref 8.4–10.5)
Chloride: 111 mEq/L (ref 96–112)
Chloride: 111 mEq/L (ref 96–112)
Chloride: 112 mEq/L (ref 96–112)
Chloride: 112 mEq/L (ref 96–112)
Chloride: 119 mEq/L — ABNORMAL HIGH (ref 96–112)
Chloride: 120 mEq/L — ABNORMAL HIGH (ref 96–112)
Chloride: 123 mEq/L — ABNORMAL HIGH (ref 96–112)
Chloride: 129 mEq/L — ABNORMAL HIGH (ref 96–112)
Chloride: 129 mEq/L — ABNORMAL HIGH (ref 96–112)
Creatinine, Ser: 0.51 mg/dL (ref 0.4–1.5)
Creatinine, Ser: 0.51 mg/dL (ref 0.4–1.5)
Creatinine, Ser: 0.62 mg/dL (ref 0.4–1.5)
Creatinine, Ser: 2.9 mg/dL — ABNORMAL HIGH (ref 0.4–1.5)
GFR calc Af Amer: 56 mL/min — ABNORMAL LOW (ref >60)
GFR calc Af Amer: >60 mL/min (ref >60)
GFR calc Af Amer: >60 mL/min (ref >60)
GFR calc non Af Amer: 24 mL/min — ABNORMAL LOW (ref >60)
GFR calc non Af Amer: >60 mL/min (ref >60)
GFR calc non Af Amer: >60 mL/min (ref >60)
GFR calc non Af Amer: >60 mL/min (ref >60)
GFR calc non Af Amer: >60 mL/min (ref >60)
GFR calc non Af Amer: >60 mL/min (ref >60)
GFR calc non Af Amer: >60 mL/min (ref >60)
GFR calc non Af Amer: >60 mL/min (ref >60)
Glucose, Bld: 108 mg/dL — ABNORMAL HIGH (ref 70–99)
Glucose, Bld: 110 mg/dL — ABNORMAL HIGH (ref 70–99)
Glucose, Bld: 113 mg/dL — ABNORMAL HIGH (ref 70–99)
Glucose, Bld: 117 mg/dL — ABNORMAL HIGH (ref 70–99)
Glucose, Bld: 117 mg/dL — ABNORMAL HIGH (ref 70–99)
Glucose, Bld: 120 mg/dL — ABNORMAL HIGH (ref 70–99)
Glucose, Bld: 123 mg/dL — ABNORMAL HIGH (ref 70–99)
Glucose, Bld: 80 mg/dL (ref 70–99)
Glucose, Bld: 97 mg/dL (ref 70–99)
Potassium: 3.4 mEq/L — ABNORMAL LOW (ref 3.5–5.1)
Potassium: 3.4 mEq/L — ABNORMAL LOW (ref 3.5–5.1)
Potassium: 3.5 mEq/L (ref 3.5–5.1)
Potassium: 3.6 mEq/L (ref 3.5–5.1)
Potassium: 3.6 mEq/L (ref 3.5–5.1)
Potassium: 4 mEq/L (ref 3.5–5.1)
Sodium: 149 mEq/L — ABNORMAL HIGH (ref 135–145)
Sodium: 150 mEq/L — ABNORMAL HIGH (ref 135–145)
Sodium: 151 mEq/L — ABNORMAL HIGH (ref 135–145)
Sodium: 162 mEq/L (ref 135–145)
Sodium: 164 mEq/L (ref 135–145)
Sodium: 165 mEq/L (ref 135–145)
Total Bilirubin: 0.5 mg/dL (ref 0.3–1.2)
Total Bilirubin: 0.8 mg/dL (ref 0.3–1.2)
Total Bilirubin: 0.8 mg/dL (ref 0.3–1.2)
Total Bilirubin: 1 mg/dL (ref 0.3–1.2)
Total Bilirubin: 1.1 mg/dL (ref 0.3–1.2)
Total Bilirubin: 1.1 mg/dL (ref 0.3–1.2)
Total Bilirubin: 1.1 mg/dL (ref 0.3–1.2)
Total Bilirubin: 1.2 mg/dL (ref 0.3–1.2)
Total Protein: 5.7 g/dL — ABNORMAL LOW (ref 6.0–8.3)
Total Protein: 5.8 g/dL — ABNORMAL LOW (ref 6.0–8.3)
Total Protein: 6 g/dL (ref 6.0–8.3)
Total Protein: 6.5 g/dL (ref 6.0–8.3)
Total Protein: 6.6 g/dL (ref 6.0–8.3)

## 2011-04-01 LAB — URINALYSIS, ROUTINE W REFLEX MICROSCOPIC
Glucose, UA: NEGATIVE mg/dL
Nitrite: NEGATIVE
Protein, ur: 30 mg/dL — AB
Specific Gravity, Urine: 1.026 (ref 1.005–1.030)
pH: 5 (ref 5.0–8.0)
pH: 5.5 (ref 5.0–8.0)

## 2011-04-01 LAB — CBC
HCT: 26.1 % — ABNORMAL LOW (ref 39.0–52.0)
HCT: 26.3 % — ABNORMAL LOW (ref 39.0–52.0)
HCT: 26.5 % — ABNORMAL LOW (ref 39.0–52.0)
HCT: 26.6 % — ABNORMAL LOW (ref 39.0–52.0)
HCT: 27 % — ABNORMAL LOW (ref 39.0–52.0)
HCT: 27.5 % — ABNORMAL LOW (ref 39.0–52.0)
HCT: 28 % — ABNORMAL LOW (ref 39.0–52.0)
HCT: 29.8 % — ABNORMAL LOW (ref 39.0–52.0)
HCT: 31.6 % — ABNORMAL LOW (ref 39.0–52.0)
HCT: 38.3 % — ABNORMAL LOW (ref 39.0–52.0)
Hemoglobin: 10.1 g/dL — ABNORMAL LOW (ref 13.0–17.0)
Hemoglobin: 12.5 g/dL — ABNORMAL LOW (ref 13.0–17.0)
Hemoglobin: 8.4 g/dL — ABNORMAL LOW (ref 13.0–17.0)
Hemoglobin: 8.6 g/dL — ABNORMAL LOW (ref 13.0–17.0)
Hemoglobin: 8.8 g/dL — ABNORMAL LOW (ref 13.0–17.0)
Hemoglobin: 8.9 g/dL — ABNORMAL LOW (ref 13.0–17.0)
Hemoglobin: 9 g/dL — ABNORMAL LOW (ref 13.0–17.0)
Hemoglobin: 9 g/dL — ABNORMAL LOW (ref 13.0–17.0)
Hemoglobin: 9.4 g/dL — ABNORMAL LOW (ref 13.0–17.0)
Hemoglobin: 9.6 g/dL — ABNORMAL LOW (ref 13.0–17.0)
Hemoglobin: 9.8 g/dL — ABNORMAL LOW (ref 13.0–17.0)
MCHC: 32.2 g/dL (ref 30.0–36.0)
MCHC: 32.4 g/dL (ref 30.0–36.0)
MCHC: 32.4 g/dL (ref 30.0–36.0)
MCHC: 32.5 g/dL (ref 30.0–36.0)
MCHC: 32.6 g/dL (ref 30.0–36.0)
MCHC: 33.4 g/dL (ref 30.0–36.0)
MCHC: 33.8 g/dL (ref 30.0–36.0)
MCV: 81.3 fL (ref 78.0–100.0)
MCV: 81.4 fL (ref 78.0–100.0)
MCV: 81.6 fL (ref 78.0–100.0)
MCV: 82.6 fL (ref 78.0–100.0)
MCV: 82.6 fL (ref 78.0–100.0)
MCV: 83 fL (ref 78.0–100.0)
Platelets: 110 10*3/uL — ABNORMAL LOW (ref 150–400)
Platelets: 57 10*3/uL — ABNORMAL LOW (ref 150–400)
Platelets: 65 10*3/uL — ABNORMAL LOW (ref 150–400)
Platelets: 85 10*3/uL — ABNORMAL LOW (ref 150–400)
Platelets: 95 10*3/uL — ABNORMAL LOW (ref 150–400)
RBC: 3.19 MIL/uL — ABNORMAL LOW (ref 4.22–5.81)
RBC: 3.26 MIL/uL — ABNORMAL LOW (ref 4.22–5.81)
RBC: 3.31 MIL/uL — ABNORMAL LOW (ref 4.22–5.81)
RBC: 3.4 MIL/uL — ABNORMAL LOW (ref 4.22–5.81)
RBC: 3.56 MIL/uL — ABNORMAL LOW (ref 4.22–5.81)
RBC: 3.59 MIL/uL — ABNORMAL LOW (ref 4.22–5.81)
RBC: 3.61 MIL/uL — ABNORMAL LOW (ref 4.22–5.81)
RBC: 4.63 MIL/uL (ref 4.22–5.81)
RDW: 14.9 % (ref 11.5–15.5)
RDW: 15.2 % (ref 11.5–15.5)
RDW: 15.2 % (ref 11.5–15.5)
RDW: 16.8 % — ABNORMAL HIGH (ref 11.5–15.5)
WBC: 5 10*3/uL (ref 4.0–10.5)
WBC: 6.6 10*3/uL (ref 4.0–10.5)
WBC: 6.8 10*3/uL (ref 4.0–10.5)
WBC: 7 10*3/uL (ref 4.0–10.5)
WBC: 7.1 10*3/uL (ref 4.0–10.5)
WBC: 7.6 10*3/uL (ref 4.0–10.5)
WBC: 7.6 10*3/uL (ref 4.0–10.5)
WBC: 8.4 10*3/uL (ref 4.0–10.5)
WBC: 9.1 10*3/uL (ref 4.0–10.5)

## 2011-04-01 LAB — GLUCOSE, CAPILLARY
Glucose-Capillary: 102 mg/dL — ABNORMAL HIGH (ref 70–99)
Glucose-Capillary: 103 mg/dL — ABNORMAL HIGH (ref 70–99)
Glucose-Capillary: 104 mg/dL — ABNORMAL HIGH (ref 70–99)
Glucose-Capillary: 105 mg/dL — ABNORMAL HIGH (ref 70–99)
Glucose-Capillary: 106 mg/dL — ABNORMAL HIGH (ref 70–99)
Glucose-Capillary: 109 mg/dL — ABNORMAL HIGH (ref 70–99)
Glucose-Capillary: 109 mg/dL — ABNORMAL HIGH (ref 70–99)
Glucose-Capillary: 110 mg/dL — ABNORMAL HIGH (ref 70–99)
Glucose-Capillary: 111 mg/dL — ABNORMAL HIGH (ref 70–99)
Glucose-Capillary: 115 mg/dL — ABNORMAL HIGH (ref 70–99)
Glucose-Capillary: 115 mg/dL — ABNORMAL HIGH (ref 70–99)
Glucose-Capillary: 116 mg/dL — ABNORMAL HIGH (ref 70–99)
Glucose-Capillary: 116 mg/dL — ABNORMAL HIGH (ref 70–99)
Glucose-Capillary: 121 mg/dL — ABNORMAL HIGH (ref 70–99)
Glucose-Capillary: 125 mg/dL — ABNORMAL HIGH (ref 70–99)
Glucose-Capillary: 125 mg/dL — ABNORMAL HIGH (ref 70–99)
Glucose-Capillary: 126 mg/dL — ABNORMAL HIGH (ref 70–99)
Glucose-Capillary: 130 mg/dL — ABNORMAL HIGH (ref 70–99)
Glucose-Capillary: 151 mg/dL — ABNORMAL HIGH (ref 70–99)
Glucose-Capillary: 56 mg/dL — ABNORMAL LOW (ref 70–99)
Glucose-Capillary: 78 mg/dL (ref 70–99)
Glucose-Capillary: 78 mg/dL (ref 70–99)
Glucose-Capillary: 81 mg/dL (ref 70–99)
Glucose-Capillary: 85 mg/dL (ref 70–99)
Glucose-Capillary: 86 mg/dL (ref 70–99)
Glucose-Capillary: 87 mg/dL (ref 70–99)
Glucose-Capillary: 88 mg/dL (ref 70–99)
Glucose-Capillary: 88 mg/dL (ref 70–99)
Glucose-Capillary: 89 mg/dL (ref 70–99)
Glucose-Capillary: 94 mg/dL (ref 70–99)
Glucose-Capillary: 95 mg/dL (ref 70–99)
Glucose-Capillary: 95 mg/dL (ref 70–99)
Glucose-Capillary: 95 mg/dL (ref 70–99)
Glucose-Capillary: 96 mg/dL (ref 70–99)
Glucose-Capillary: 99 mg/dL (ref 70–99)

## 2011-04-01 LAB — PROTIME-INR
INR: 1.89 — ABNORMAL HIGH (ref 0.00–1.49)
INR: 1.98 — ABNORMAL HIGH (ref 0.00–1.49)
INR: 2 — ABNORMAL HIGH (ref 0.00–1.49)
INR: 2.12 — ABNORMAL HIGH (ref 0.00–1.49)
INR: 2.48 — ABNORMAL HIGH (ref 0.00–1.49)
INR: 2.68 — ABNORMAL HIGH (ref 0.00–1.49)
INR: 2.73 — ABNORMAL HIGH (ref 0.00–1.49)
Prothrombin Time: 22.3 seconds — ABNORMAL HIGH (ref 11.6–15.2)
Prothrombin Time: 26.6 seconds — ABNORMAL HIGH (ref 11.6–15.2)
Prothrombin Time: 27 seconds — ABNORMAL HIGH (ref 11.6–15.2)
Prothrombin Time: 28.3 seconds — ABNORMAL HIGH (ref 11.6–15.2)
Prothrombin Time: 30.6 seconds — ABNORMAL HIGH (ref 11.6–15.2)
Prothrombin Time: 31.2 seconds — ABNORMAL HIGH (ref 11.6–15.2)
Prothrombin Time: 32.7 seconds — ABNORMAL HIGH (ref 11.6–15.2)

## 2011-04-01 LAB — MAGNESIUM
Magnesium: 1.8 mg/dL (ref 1.5–2.5)
Magnesium: 1.8 mg/dL (ref 1.5–2.5)
Magnesium: 2 mg/dL (ref 1.5–2.5)
Magnesium: 2.9 mg/dL — ABNORMAL HIGH (ref 1.5–2.5)
Magnesium: 3 mg/dL — ABNORMAL HIGH (ref 1.5–2.5)

## 2011-04-01 LAB — BODY FLUID CULTURE

## 2011-04-01 LAB — BASIC METABOLIC PANEL
BUN: 17 mg/dL (ref 6–23)
CO2: 28 mEq/L (ref 19–32)
CO2: 29 mEq/L (ref 19–32)
Calcium: 8.5 mg/dL (ref 8.4–10.5)
Chloride: 113 mEq/L — ABNORMAL HIGH (ref 96–112)
Chloride: 115 mEq/L — ABNORMAL HIGH (ref 96–112)
Creatinine, Ser: 0.63 mg/dL (ref 0.4–1.5)
Creatinine, Ser: 0.66 mg/dL (ref 0.4–1.5)
GFR calc Af Amer: 17 mL/min — ABNORMAL LOW (ref >60)
GFR calc Af Amer: >60 mL/min (ref >60)
Glucose, Bld: 152 mg/dL — ABNORMAL HIGH (ref 70–99)
Glucose, Bld: 98 mg/dL (ref 70–99)
Potassium: 2.8 mEq/L — ABNORMAL LOW (ref 3.5–5.1)
Sodium: 150 mEq/L — ABNORMAL HIGH (ref 135–145)

## 2011-04-01 LAB — CULTURE, RESPIRATORY W GRAM STAIN: Gram Stain: NONE SEEN

## 2011-04-01 LAB — CROSSMATCH
ABO/RH(D): O POS
Antibody Screen: POSITIVE
Antibody Screen: POSITIVE
DAT, IgG: NEGATIVE
Donor AG Type: NEGATIVE
Donor AG Type: NEGATIVE
Donor AG Type: NEGATIVE
PT AG Type: NEGATIVE

## 2011-04-01 LAB — T3, FREE: T3, Free: 5.4 pg/mL — ABNORMAL HIGH (ref 2.3–4.2)

## 2011-04-01 LAB — URINE CULTURE: Colony Count: 35000

## 2011-04-01 LAB — TOBRAMYCIN LEVEL, RANDOM: Tobramycin Rm: 2.8 ug/mL

## 2011-04-01 LAB — DIGOXIN LEVEL
Digoxin Level: 1.7 ng/mL (ref 0.8–2.0)
Digoxin Level: 2 ng/mL (ref 0.8–2.0)
Digoxin Level: 2.2 ng/mL — ABNORMAL HIGH (ref 0.8–2.0)

## 2011-04-01 LAB — CLOSTRIDIUM DIFFICILE EIA: C difficile Toxins A+B, EIA: NEGATIVE

## 2011-04-01 LAB — VANCOMYCIN, TROUGH: Vancomycin Tr: 13 ug/mL (ref 10.0–20.0)

## 2011-04-01 LAB — CULTURE, BLOOD (ROUTINE X 2): Culture: NO GROWTH

## 2011-04-01 LAB — URINE MICROSCOPIC-ADD ON

## 2011-04-01 LAB — T4, FREE: Free T4: 3.01 ng/dL — ABNORMAL HIGH (ref 0.80–1.80)

## 2011-04-01 LAB — TSH: TSH: 0.034 u[IU]/mL — ABNORMAL LOW (ref 0.350–4.500)

## 2011-04-12 ENCOUNTER — Emergency Department (HOSPITAL_COMMUNITY): Payer: Medicare Other

## 2011-04-12 ENCOUNTER — Emergency Department (HOSPITAL_COMMUNITY)
Admission: EM | Admit: 2011-04-12 | Discharge: 2011-04-12 | Disposition: A | Discharge status: Home / Self Care | Payer: Medicare Other | Attending: Emergency Medicine | Admitting: Emergency Medicine

## 2011-04-12 DIAGNOSIS — E119 Type 2 diabetes mellitus without complications: Secondary | ICD-10-CM | POA: Insufficient documentation

## 2011-04-12 DIAGNOSIS — E0590 Thyrotoxicosis, unspecified without thyrotoxic crisis or storm: Secondary | ICD-10-CM | POA: Insufficient documentation

## 2011-04-12 DIAGNOSIS — F411 Generalized anxiety disorder: Secondary | ICD-10-CM | POA: Insufficient documentation

## 2011-04-12 DIAGNOSIS — Z431 Encounter for attention to gastrostomy: Secondary | ICD-10-CM | POA: Insufficient documentation

## 2011-04-12 MED ORDER — IOHEXOL 300 MG/ML  SOLN
50.0000 mL | Freq: Once | INTRAMUSCULAR | Status: AC | PRN
  Administered 2011-04-12: 50 mL

## 2011-05-15 IMAGING — CR DG ABDOMEN 1V
2 series · 2 of 2 positions shown · non-contrast
Comparison: 02/18/2011

CLINICAL DATA: Peg tube reinserted.

ABDOMEN - 1 VIEW

[view not recorded (1 of 2)]
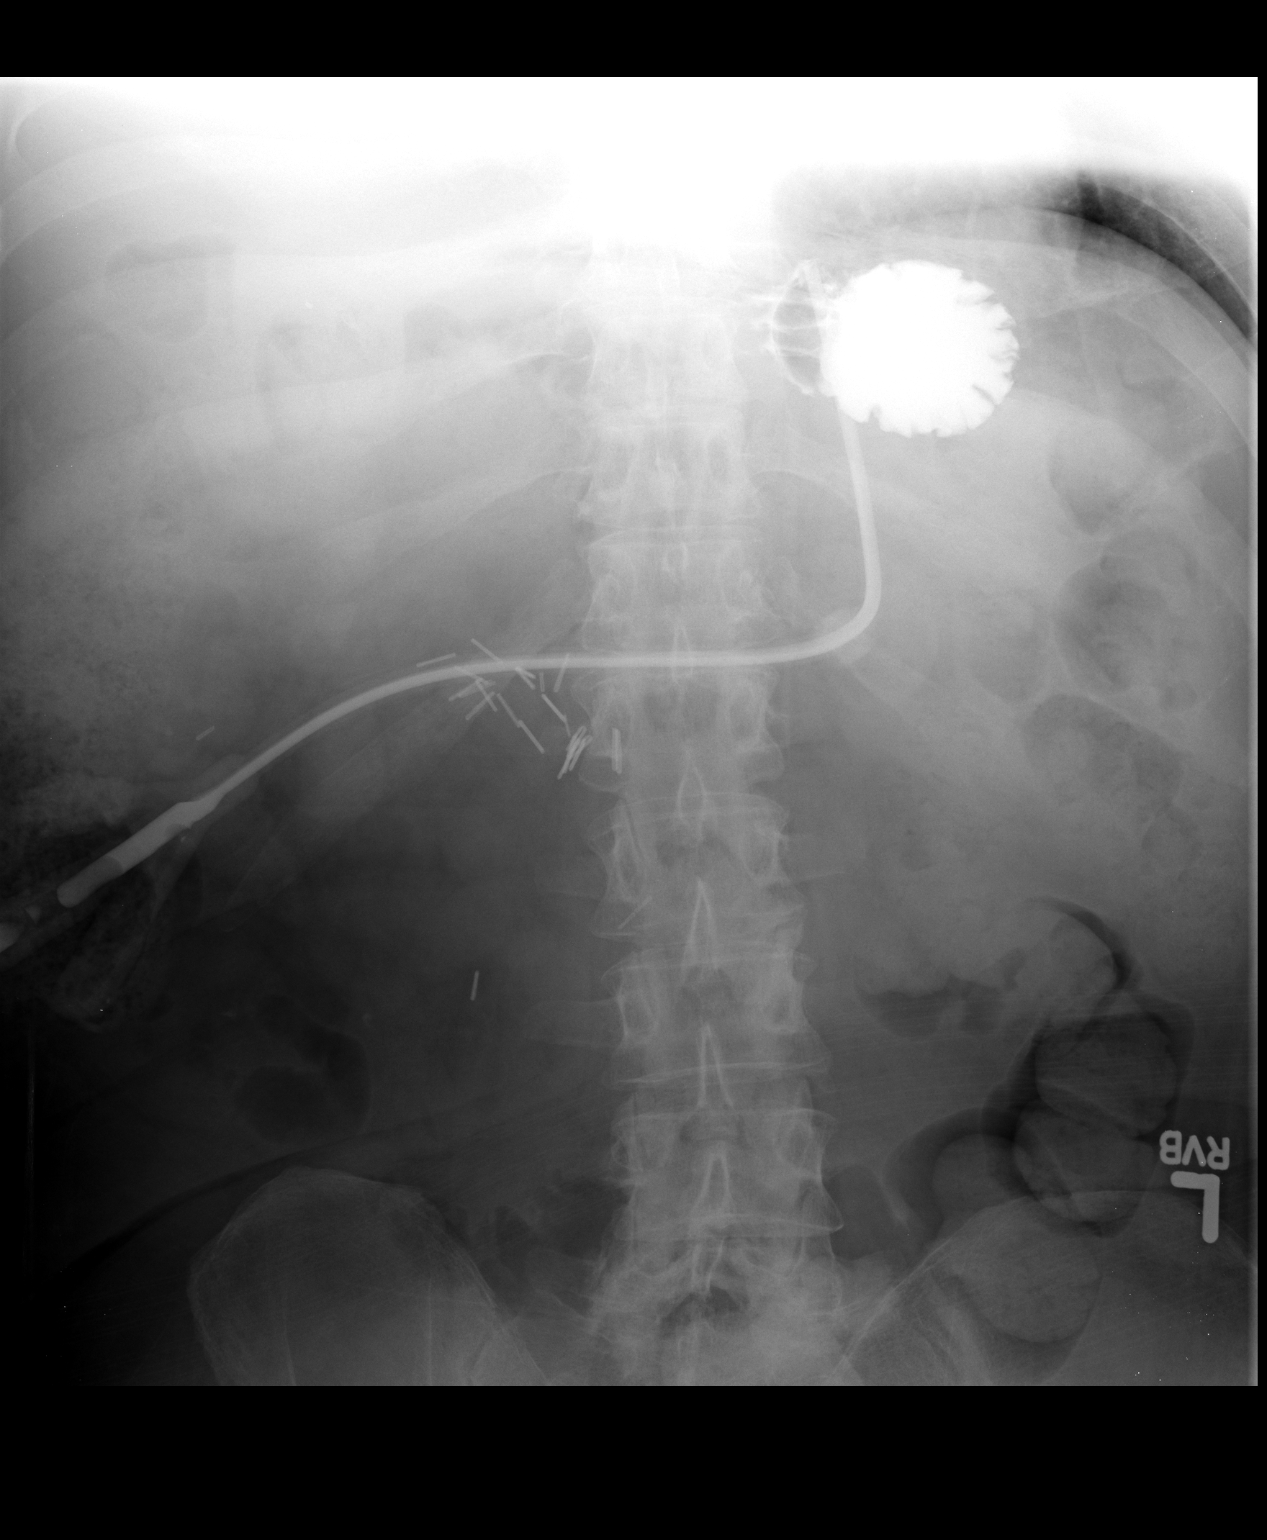

[view not recorded (2 of 2)]
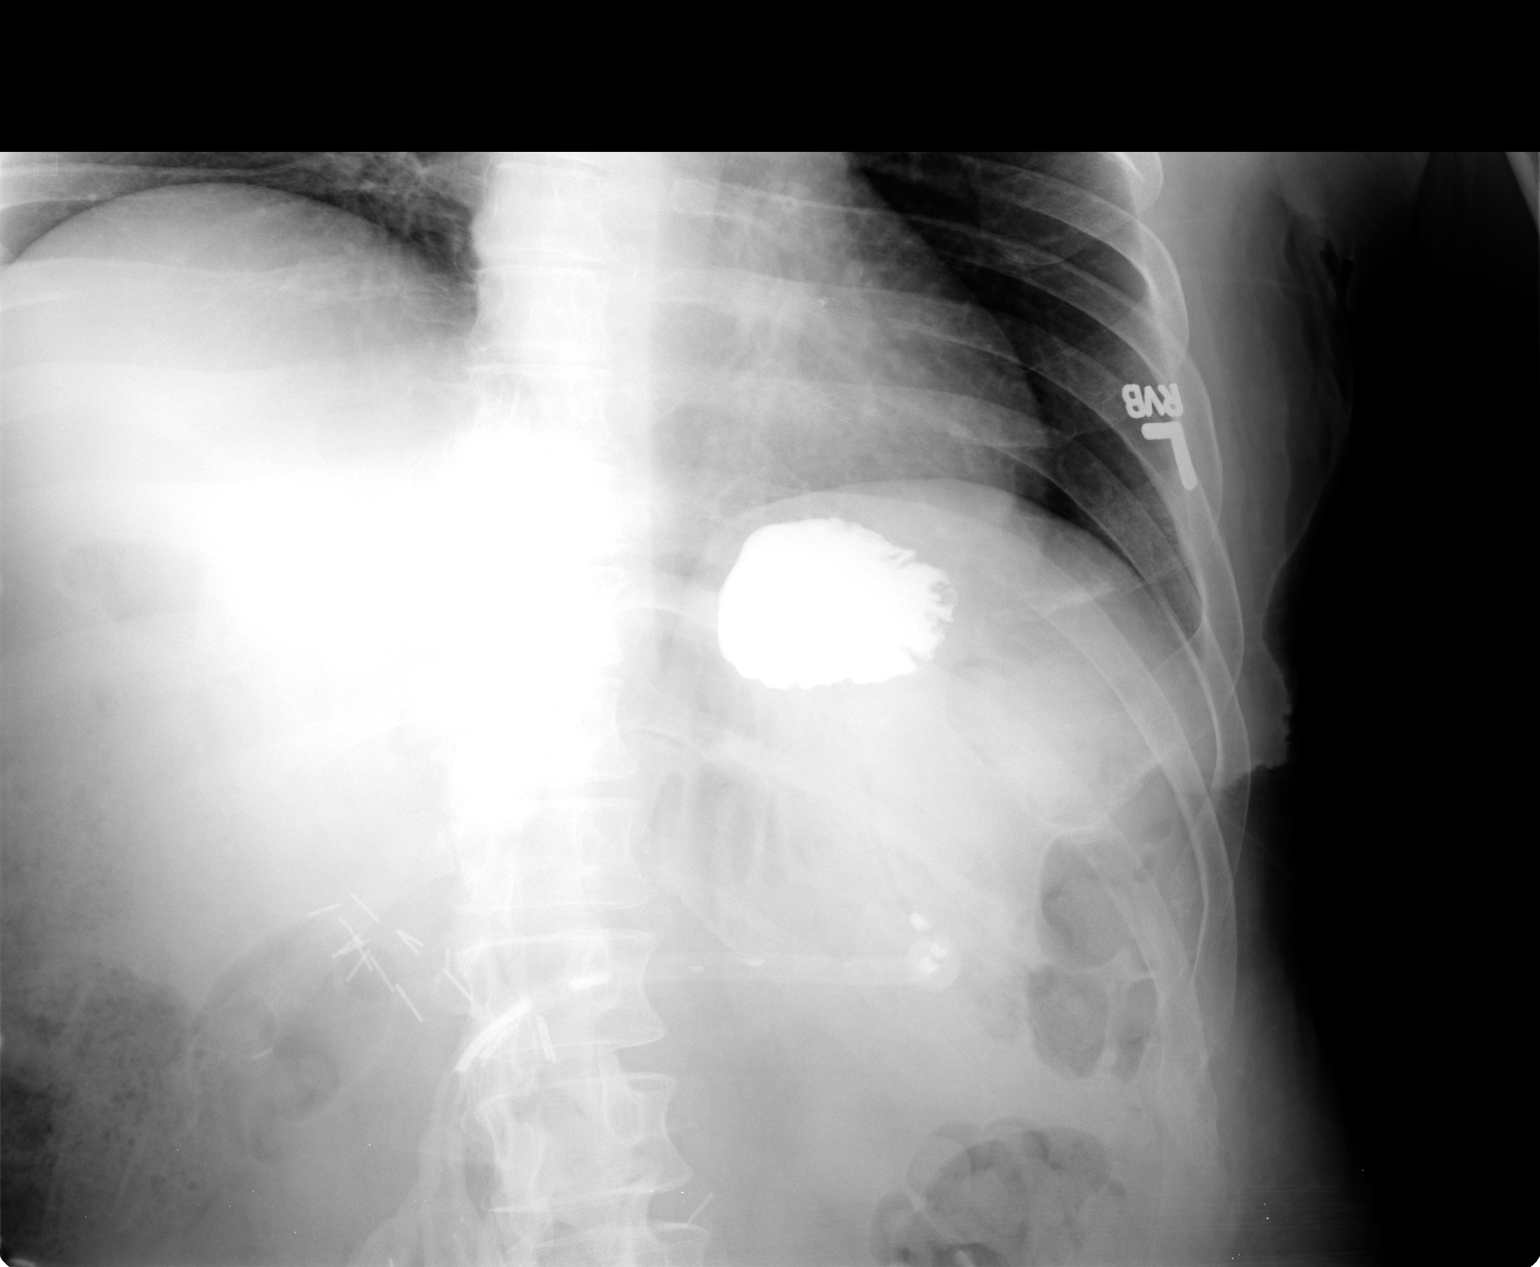

[2 of 2 positions shown; findings below may reference images not displayed]

FINDINGS: Contrast has been instilled through the percutaneous
gastrostomy tube.  This was present within the fundus of the
stomach.  No extravasation.  Bowel gas pattern is otherwise
unremarkable.
IMPRESSION: Percutaneous gastrostomy tube appears well positioned.  Injected
contrast does not extravasate.

## 2011-10-04 ENCOUNTER — Emergency Department (HOSPITAL_COMMUNITY): Payer: Medicare Other

## 2011-10-04 ENCOUNTER — Inpatient Hospital Stay (HOSPITAL_COMMUNITY)
Admission: EM | Admit: 2011-10-04 | Discharge: 2011-10-13 | DRG: 698 | Disposition: A | Discharge status: Skilled Nursing Facility | Payer: Medicare Other | Attending: Internal Medicine | Admitting: Internal Medicine

## 2011-10-04 DIAGNOSIS — D72829 Elevated white blood cell count, unspecified: Secondary | ICD-10-CM | POA: Diagnosis present

## 2011-10-04 DIAGNOSIS — R578 Other shock: Secondary | ICD-10-CM | POA: Diagnosis present

## 2011-10-04 DIAGNOSIS — E039 Hypothyroidism, unspecified: Secondary | ICD-10-CM | POA: Diagnosis present

## 2011-10-04 DIAGNOSIS — E872 Acidosis, unspecified: Secondary | ICD-10-CM | POA: Diagnosis present

## 2011-10-04 DIAGNOSIS — E785 Hyperlipidemia, unspecified: Secondary | ICD-10-CM | POA: Diagnosis present

## 2011-10-04 DIAGNOSIS — G40909 Epilepsy, unspecified, not intractable, without status epilepticus: Secondary | ICD-10-CM | POA: Diagnosis present

## 2011-10-04 DIAGNOSIS — R0902 Hypoxemia: Secondary | ICD-10-CM | POA: Diagnosis present

## 2011-10-04 DIAGNOSIS — Z7982 Long term (current) use of aspirin: Secondary | ICD-10-CM

## 2011-10-04 DIAGNOSIS — F341 Dysthymic disorder: Secondary | ICD-10-CM | POA: Diagnosis present

## 2011-10-04 DIAGNOSIS — W19XXXA Unspecified fall, initial encounter: Secondary | ICD-10-CM | POA: Diagnosis present

## 2011-10-04 DIAGNOSIS — G931 Anoxic brain damage, not elsewhere classified: Secondary | ICD-10-CM | POA: Diagnosis present

## 2011-10-04 DIAGNOSIS — S36899A Unspecified injury of other intra-abdominal organs, initial encounter: Principal | ICD-10-CM | POA: Diagnosis present

## 2011-10-04 DIAGNOSIS — Z8674 Personal history of sudden cardiac arrest: Secondary | ICD-10-CM

## 2011-10-04 DIAGNOSIS — G8250 Quadriplegia, unspecified: Secondary | ICD-10-CM | POA: Diagnosis present

## 2011-10-04 DIAGNOSIS — I4891 Unspecified atrial fibrillation: Secondary | ICD-10-CM | POA: Diagnosis present

## 2011-10-04 LAB — COMPREHENSIVE METABOLIC PANEL
ALT: 12 U/L (ref 0–53)
AST: 17 U/L (ref 0–37)
Albumin: 3.8 g/dL (ref 3.5–5.2)
Alkaline Phosphatase: 90 U/L (ref 39–117)
BUN: 20 mg/dL (ref 6–23)
CO2: 18 mEq/L — ABNORMAL LOW (ref 19–32)
Calcium: 9.2 mg/dL (ref 8.4–10.5)
Chloride: 103 mEq/L (ref 96–112)
Creatinine, Ser: 1.18 mg/dL (ref 0.50–1.35)
GFR calc Af Amer: 86 mL/min — ABNORMAL LOW (ref >90)
GFR calc non Af Amer: 74 mL/min — ABNORMAL LOW (ref >90)
Glucose, Bld: 122 mg/dL — ABNORMAL HIGH (ref 70–99)
Potassium: 4.7 mEq/L (ref 3.5–5.1)
Sodium: 136 mEq/L (ref 135–145)
Total Bilirubin: 0.8 mg/dL (ref 0.3–1.2)
Total Protein: 7 g/dL (ref 6.0–8.3)

## 2011-10-04 LAB — DIFFERENTIAL
Basophils Absolute: 0 10*3/uL (ref 0.0–0.1)
Basophils Relative: 0 % (ref 0–1)
Eosinophils Absolute: 0 10*3/uL (ref 0.0–0.7)
Eosinophils Relative: 0 % (ref 0–5)
Lymphocytes Relative: 15 % (ref 12–46)
Lymphs Abs: 2.2 10*3/uL (ref 0.7–4.0)
Monocytes Absolute: 1 10*3/uL (ref 0.1–1.0)
Monocytes Relative: 7 % (ref 3–12)
Neutro Abs: 11.4 10*3/uL — ABNORMAL HIGH (ref 1.7–7.7)
Neutrophils Relative %: 78 % — ABNORMAL HIGH (ref 43–77)

## 2011-10-04 LAB — URINALYSIS, ROUTINE W REFLEX MICROSCOPIC
Bilirubin Urine: NEGATIVE
Glucose, UA: NEGATIVE mg/dL
Hgb urine dipstick: NEGATIVE
Ketones, ur: 15 mg/dL — AB
Nitrite: NEGATIVE
Protein, ur: 30 mg/dL — AB
Specific Gravity, Urine: 1.031 — ABNORMAL HIGH (ref 1.005–1.030)
Urobilinogen, UA: 0.2 mg/dL (ref 0.0–1.0)
pH: 5.5 (ref 5.0–8.0)

## 2011-10-04 LAB — CBC
HCT: 41 % (ref 39.0–52.0)
Hemoglobin: 14.7 g/dL (ref 13.0–17.0)
MCH: 29.9 pg (ref 26.0–34.0)
MCHC: 35.9 g/dL (ref 30.0–36.0)
MCV: 83.3 fL (ref 78.0–100.0)
Platelets: ADEQUATE 10*3/uL (ref 150–400)
RBC: 4.92 MIL/uL (ref 4.22–5.81)
RDW: 12.7 % (ref 11.5–15.5)
WBC: 14.6 10*3/uL — ABNORMAL HIGH (ref 4.0–10.5)

## 2011-10-04 LAB — URINE MICROSCOPIC-ADD ON

## 2011-10-04 LAB — LACTIC ACID, PLASMA: Lactic Acid, Venous: 2.4 mmol/L — ABNORMAL HIGH (ref 0.5–2.2)

## 2011-10-05 ENCOUNTER — Other Ambulatory Visit (HOSPITAL_COMMUNITY): Payer: Medicare Other

## 2011-10-05 ENCOUNTER — Encounter (HOSPITAL_COMMUNITY): Payer: Self-pay | Admitting: Radiology

## 2011-10-05 ENCOUNTER — Inpatient Hospital Stay (HOSPITAL_COMMUNITY): Payer: Medicare Other

## 2011-10-05 DIAGNOSIS — R578 Other shock: Secondary | ICD-10-CM

## 2011-10-05 LAB — APTT: aPTT: 33 seconds (ref 24–37)

## 2011-10-05 LAB — COMPREHENSIVE METABOLIC PANEL
Albumin: 2.7 g/dL — ABNORMAL LOW (ref 3.5–5.2)
BUN: 20 mg/dL (ref 6–23)
Creatinine, Ser: 1.02 mg/dL (ref 0.50–1.35)
GFR calc Af Amer: >90 mL/min (ref >90)
Glucose, Bld: 110 mg/dL — ABNORMAL HIGH (ref 70–99)
Total Protein: 5.1 g/dL — ABNORMAL LOW (ref 6.0–8.3)

## 2011-10-05 LAB — DIFFERENTIAL
Basophils Absolute: 0 10*3/uL (ref 0.0–0.1)
Basophils Absolute: 0 10*3/uL (ref 0.0–0.1)
Basophils Relative: 0 % (ref 0–1)
Eosinophils Absolute: 0 10*3/uL (ref 0.0–0.7)
Eosinophils Absolute: 0 10*3/uL (ref 0.0–0.7)
Eosinophils Relative: 0 % (ref 0–5)
Lymphocytes Relative: 11 % — ABNORMAL LOW (ref 12–46)
Lymphocytes Relative: 13 % (ref 12–46)
Lymphs Abs: 1.3 10*3/uL (ref 0.7–4.0)
Neutrophils Relative %: 83 % — ABNORMAL HIGH (ref 43–77)
Neutrophils Relative %: 81 % — ABNORMAL HIGH (ref 43–77)

## 2011-10-05 LAB — URINE CULTURE
Colony Count: NO GROWTH
Culture  Setup Time: 201210080222
Culture: NO GROWTH

## 2011-10-05 LAB — PROCALCITONIN: Procalcitonin: <0.1 ng/mL

## 2011-10-05 LAB — BASIC METABOLIC PANEL
BUN: 19 mg/dL (ref 6–23)
GFR calc Af Amer: >90 mL/min (ref >90)
GFR calc non Af Amer: 86 mL/min — ABNORMAL LOW (ref >90)
Potassium: 4.3 mEq/L (ref 3.5–5.1)
Sodium: 140 mEq/L (ref 135–145)

## 2011-10-05 LAB — CBC
HCT: 23.3 % — ABNORMAL LOW (ref 39.0–52.0)
Hemoglobin: 8.4 g/dL — ABNORMAL LOW (ref 13.0–17.0)
Hemoglobin: 8.1 g/dL — ABNORMAL LOW (ref 13.0–17.0)
MCH: 29.2 pg (ref 26.0–34.0)
MCHC: 35.5 g/dL (ref 30.0–36.0)
MCV: 82.8 fL (ref 78.0–100.0)
MCV: 83.5 fL (ref 78.0–100.0)
Platelets: 144 10*3/uL — ABNORMAL LOW (ref 150–400)
Platelets: 130 10*3/uL — ABNORMAL LOW (ref 150–400)
Platelets: 144 10*3/uL — ABNORMAL LOW (ref 150–400)
RBC: 3.03 MIL/uL — ABNORMAL LOW (ref 4.22–5.81)
RBC: 2.79 MIL/uL — ABNORMAL LOW (ref 4.22–5.81)
RBC: 2.88 MIL/uL — ABNORMAL LOW (ref 4.22–5.81)
RBC: 2.72 MIL/uL — ABNORMAL LOW (ref 4.22–5.81)
RDW: 12.8 % (ref 11.5–15.5)
RDW: 12.8 % (ref 11.5–15.5)
WBC: 11.8 10*3/uL — ABNORMAL HIGH (ref 4.0–10.5)
WBC: 9.7 10*3/uL (ref 4.0–10.5)
WBC: 10.2 10*3/uL (ref 4.0–10.5)

## 2011-10-05 LAB — PHOSPHORUS: Phosphorus: 3.7 mg/dL (ref 2.3–4.6)

## 2011-10-05 LAB — GLUCOSE, CAPILLARY
Glucose-Capillary: 108 mg/dL — ABNORMAL HIGH (ref 70–99)
Glucose-Capillary: 120 mg/dL — ABNORMAL HIGH (ref 70–99)
Glucose-Capillary: 120 mg/dL — ABNORMAL HIGH (ref 70–99)
Glucose-Capillary: 115 mg/dL — ABNORMAL HIGH (ref 70–99)

## 2011-10-05 LAB — LIPASE, BLOOD: Lipase: 20 U/L (ref 11–59)

## 2011-10-05 LAB — CK TOTAL AND CKMB (NOT AT ARMC)
CK, MB: 3.4 ng/mL (ref 0.3–4.0)
Relative Index: INVALID (ref 0.0–2.5)
Total CK: 47 U/L (ref 7–232)

## 2011-10-05 LAB — HEMOGLOBIN A1C
Hgb A1c MFr Bld: 5 % (ref <5.7)
Mean Plasma Glucose: 97 mg/dL (ref <117)

## 2011-10-05 LAB — PROTIME-INR
INR: 1.18 (ref 0.00–1.49)
Prothrombin Time: 15.2 seconds (ref 11.6–15.2)

## 2011-10-05 LAB — CARBOXYHEMOGLOBIN
Methemoglobin: 1.5 % (ref 0.0–1.5)
O2 Saturation: 56.4 %

## 2011-10-05 LAB — LACTIC ACID, PLASMA: Lactic Acid, Venous: 1.3 mmol/L (ref 0.5–2.2)

## 2011-10-05 LAB — TSH: TSH: 1.372 u[IU]/mL (ref 0.350–4.500)

## 2011-10-05 LAB — D-DIMER, QUANTITATIVE: D-Dimer, Quant: 0.27 ug/mL-FEU (ref 0.00–0.48)

## 2011-10-06 DIAGNOSIS — R578 Other shock: Secondary | ICD-10-CM

## 2011-10-06 LAB — DIFFERENTIAL
Basophils Relative: 0 % (ref 0–1)
Lymphs Abs: 2 10*3/uL (ref 0.7–4.0)
Monocytes Absolute: 1 10*3/uL (ref 0.1–1.0)
Monocytes Relative: 9 % (ref 3–12)
Neutro Abs: 7.9 10*3/uL — ABNORMAL HIGH (ref 1.7–7.7)

## 2011-10-06 LAB — BASIC METABOLIC PANEL
BUN: 16 mg/dL (ref 6–23)
CO2: 19 mEq/L (ref 19–32)
CO2: 20 mEq/L (ref 19–32)
Calcium: 7.9 mg/dL — ABNORMAL LOW (ref 8.4–10.5)
Creatinine, Ser: 1.03 mg/dL (ref 0.50–1.35)
GFR calc non Af Amer: >90 mL/min (ref >90)
Glucose, Bld: 89 mg/dL (ref 70–99)
Glucose, Bld: 95 mg/dL (ref 70–99)
Potassium: 2.9 mEq/L — ABNORMAL LOW (ref 3.5–5.1)
Sodium: 139 mEq/L (ref 135–145)

## 2011-10-06 LAB — MAGNESIUM
Magnesium: 1.6 mg/dL (ref 1.5–2.5)
Magnesium: 1.6 mg/dL (ref 1.5–2.5)

## 2011-10-06 LAB — IRON AND TIBC
Iron: 19 ug/dL — ABNORMAL LOW (ref 42–135)
TIBC: 153 ug/dL — ABNORMAL LOW (ref 215–435)

## 2011-10-06 LAB — PHOSPHORUS: Phosphorus: 2.5 mg/dL (ref 2.3–4.6)

## 2011-10-06 LAB — CBC
Hemoglobin: 6.5 g/dL — CL (ref 13.0–17.0)
Hemoglobin: 7.1 g/dL — ABNORMAL LOW (ref 13.0–17.0)
MCH: 29.5 pg (ref 26.0–34.0)
MCHC: 35.3 g/dL (ref 30.0–36.0)
MCV: 83.4 fL (ref 78.0–100.0)
Platelets: 83 10*3/uL — ABNORMAL LOW (ref 150–400)
RBC: 2.2 MIL/uL — ABNORMAL LOW (ref 4.22–5.81)

## 2011-10-06 LAB — GLUCOSE, CAPILLARY
Glucose-Capillary: 86 mg/dL (ref 70–99)
Glucose-Capillary: 91 mg/dL (ref 70–99)
Glucose-Capillary: 102 mg/dL — ABNORMAL HIGH (ref 70–99)

## 2011-10-06 LAB — LACTIC ACID, PLASMA: Lactic Acid, Venous: 1.4 mmol/L (ref 0.5–2.2)

## 2011-10-06 LAB — VITAMIN B12: Vitamin B-12: 347 pg/mL (ref 211–911)

## 2011-10-06 LAB — FERRITIN: Ferritin: 220 ng/mL (ref 22–322)

## 2011-10-07 ENCOUNTER — Inpatient Hospital Stay (HOSPITAL_COMMUNITY): Payer: Medicare Other

## 2011-10-07 LAB — CBC
HCT: 19.9 % — ABNORMAL LOW (ref 39.0–52.0)
HCT: 25.2 % — ABNORMAL LOW (ref 39.0–52.0)
Hemoglobin: 6.9 g/dL — CL (ref 13.0–17.0)
Hemoglobin: 8.7 g/dL — ABNORMAL LOW (ref 13.0–17.0)
MCH: 29.4 pg (ref 26.0–34.0)
MCH: 29.2 pg (ref 26.0–34.0)
MCHC: 34.7 g/dL (ref 30.0–36.0)
MCHC: 34.5 g/dL (ref 30.0–36.0)
MCV: 84.7 fL (ref 78.0–100.0)
MCV: 84.6 fL (ref 78.0–100.0)
Platelets: 96 10*3/uL — ABNORMAL LOW (ref 150–400)
Platelets: 77 10*3/uL — ABNORMAL LOW (ref 150–400)
RBC: 2.35 MIL/uL — ABNORMAL LOW (ref 4.22–5.81)
RBC: 2.98 MIL/uL — ABNORMAL LOW (ref 4.22–5.81)
RDW: 13.9 % (ref 11.5–15.5)
RDW: 14.1 % (ref 11.5–15.5)
WBC: 6.3 10*3/uL (ref 4.0–10.5)
WBC: 3.8 10*3/uL — ABNORMAL LOW (ref 4.0–10.5)

## 2011-10-07 LAB — GLUCOSE, CAPILLARY
Glucose-Capillary: 100 mg/dL — ABNORMAL HIGH (ref 70–99)
Glucose-Capillary: 73 mg/dL (ref 70–99)
Glucose-Capillary: 79 mg/dL (ref 70–99)
Glucose-Capillary: 82 mg/dL (ref 70–99)
Glucose-Capillary: 89 mg/dL (ref 70–99)
Glucose-Capillary: 92 mg/dL (ref 70–99)

## 2011-10-07 LAB — BASIC METABOLIC PANEL WITH GFR
BUN: 7 mg/dL (ref 6–23)
CO2: 24 meq/L (ref 19–32)
Calcium: 8.2 mg/dL — ABNORMAL LOW (ref 8.4–10.5)
Chloride: 113 meq/L — ABNORMAL HIGH (ref 96–112)
Creatinine, Ser: 0.68 mg/dL (ref 0.50–1.35)
GFR calc Af Amer: >90 mL/min
GFR calc non Af Amer: >90 mL/min
Glucose, Bld: 98 mg/dL (ref 70–99)
Potassium: 3.9 meq/L (ref 3.5–5.1)
Sodium: 141 meq/L (ref 135–145)

## 2011-10-07 LAB — PREPARE RBC (CROSSMATCH)

## 2011-10-08 DIAGNOSIS — R578 Other shock: Secondary | ICD-10-CM

## 2011-10-08 DIAGNOSIS — J9600 Acute respiratory failure, unspecified whether with hypoxia or hypercapnia: Secondary | ICD-10-CM

## 2011-10-08 LAB — GLUCOSE, CAPILLARY
Glucose-Capillary: 86 mg/dL (ref 70–99)
Glucose-Capillary: 70 mg/dL (ref 70–99)

## 2011-10-08 LAB — CROSSMATCH
Antibody Screen: POSITIVE
Donor AG Type: NEGATIVE
Unit division: 0
Unit division: 0

## 2011-10-08 LAB — CBC
HCT: 25.6 % — ABNORMAL LOW (ref 39.0–52.0)
HCT: 27.5 % — ABNORMAL LOW (ref 39.0–52.0)
MCH: 29.1 pg (ref 26.0–34.0)
MCH: 29.3 pg (ref 26.0–34.0)
MCHC: 34.4 g/dL (ref 30.0–36.0)
MCHC: 34.9 g/dL (ref 30.0–36.0)
MCV: 84.8 fL (ref 78.0–100.0)
MCV: 83.8 fL (ref 78.0–100.0)
Platelets: 92 10*3/uL — ABNORMAL LOW (ref 150–400)
Platelets: 108 10*3/uL — ABNORMAL LOW (ref 150–400)
RDW: 14.3 % (ref 11.5–15.5)
RDW: 14.1 % (ref 11.5–15.5)
WBC: 4.6 10*3/uL (ref 4.0–10.5)

## 2011-10-08 LAB — BASIC METABOLIC PANEL
BUN: 5 mg/dL — ABNORMAL LOW (ref 6–23)
Calcium: 8.4 mg/dL (ref 8.4–10.5)
Creatinine, Ser: 0.56 mg/dL (ref 0.50–1.35)
GFR calc Af Amer: >90 mL/min (ref >90)
GFR calc non Af Amer: >90 mL/min (ref >90)

## 2011-10-09 DIAGNOSIS — I4891 Unspecified atrial fibrillation: Secondary | ICD-10-CM

## 2011-10-09 LAB — CBC
HCT: 24.9 % — ABNORMAL LOW (ref 39.0–52.0)
HCT: 25.3 % — ABNORMAL LOW (ref 39.0–52.0)
Hemoglobin: 8.8 g/dL — ABNORMAL LOW (ref 13.0–17.0)
MCH: 28.8 pg (ref 26.0–34.0)
MCH: 30 pg (ref 26.0–34.0)
MCV: 85.8 fL (ref 78.0–100.0)
RBC: 2.93 MIL/uL — ABNORMAL LOW (ref 4.22–5.81)
RBC: 2.95 MIL/uL — ABNORMAL LOW (ref 4.22–5.81)
RDW: 14.5 % (ref 11.5–15.5)
WBC: 5.1 10*3/uL (ref 4.0–10.5)

## 2011-10-09 LAB — BASIC METABOLIC PANEL
BUN: 6 mg/dL (ref 6–23)
CO2: 24 mEq/L (ref 19–32)
Calcium: 7.9 mg/dL — ABNORMAL LOW (ref 8.4–10.5)
Chloride: 108 mEq/L (ref 96–112)
Creatinine, Ser: 0.78 mg/dL (ref 0.50–1.35)

## 2011-10-09 LAB — LACTIC ACID, PLASMA: Lactic Acid, Venous: 0.6 mmol/L (ref 0.5–2.2)

## 2011-10-09 LAB — GLUCOSE, CAPILLARY
Glucose-Capillary: 82 mg/dL (ref 70–99)
Glucose-Capillary: 109 mg/dL — ABNORMAL HIGH (ref 70–99)

## 2011-10-09 LAB — HEMOGLOBIN AND HEMATOCRIT, BLOOD
HCT: 25.2 % — ABNORMAL LOW (ref 39.0–52.0)
Hemoglobin: 8.5 g/dL — ABNORMAL LOW (ref 13.0–17.0)

## 2011-10-09 LAB — TSH: TSH: 0.949 u[IU]/mL (ref 0.350–4.500)

## 2011-10-10 LAB — GLUCOSE, CAPILLARY
Glucose-Capillary: 103 mg/dL — ABNORMAL HIGH (ref 70–99)
Glucose-Capillary: 90 mg/dL (ref 70–99)

## 2011-10-10 LAB — CBC
MCH: 29.5 pg (ref 26.0–34.0)
MCHC: 34.4 g/dL (ref 30.0–36.0)
Platelets: 128 10*3/uL — ABNORMAL LOW (ref 150–400)
RDW: 14.2 % (ref 11.5–15.5)

## 2011-10-10 LAB — BASIC METABOLIC PANEL
Calcium: 8.3 mg/dL — ABNORMAL LOW (ref 8.4–10.5)
GFR calc Af Amer: >90 mL/min (ref >90)
GFR calc non Af Amer: >90 mL/min (ref >90)
Glucose, Bld: 78 mg/dL (ref 70–99)
Sodium: 135 mEq/L (ref 135–145)

## 2011-10-11 LAB — BASIC METABOLIC PANEL
BUN: 8 mg/dL (ref 6–23)
CO2: 24 mEq/L (ref 19–32)
Chloride: 105 mEq/L (ref 96–112)
Creatinine, Ser: 0.71 mg/dL (ref 0.50–1.35)

## 2011-10-11 LAB — CULTURE, BLOOD (ROUTINE X 2)
Culture  Setup Time: 201210080203
Culture  Setup Time: 201210080203
Culture: NO GROWTH
Culture: NO GROWTH

## 2011-10-11 LAB — CBC
HCT: 26.2 % — ABNORMAL LOW (ref 39.0–52.0)
MCV: 85.6 fL (ref 78.0–100.0)
RBC: 3.06 MIL/uL — ABNORMAL LOW (ref 4.22–5.81)
RDW: 14.3 % (ref 11.5–15.5)
WBC: 6.5 10*3/uL (ref 4.0–10.5)

## 2011-10-11 LAB — DIFFERENTIAL
Eosinophils Relative: 2 % (ref 0–5)
Lymphocytes Relative: 20 % (ref 12–46)
Lymphs Abs: 1.3 10*3/uL (ref 0.7–4.0)

## 2011-10-12 ENCOUNTER — Inpatient Hospital Stay (HOSPITAL_COMMUNITY): Payer: Medicare Other

## 2011-10-12 LAB — BASIC METABOLIC PANEL
Chloride: 104 mEq/L (ref 96–112)
GFR calc Af Amer: >90 mL/min (ref >90)
Potassium: 3.6 mEq/L (ref 3.5–5.1)
Sodium: 136 mEq/L (ref 135–145)

## 2011-10-12 LAB — CBC
HCT: 26.8 % — ABNORMAL LOW (ref 39.0–52.0)
Hemoglobin: 9 g/dL — ABNORMAL LOW (ref 13.0–17.0)
Hemoglobin: 8.7 g/dL — ABNORMAL LOW (ref 13.0–17.0)
MCH: 29.1 pg (ref 26.0–34.0)
MCHC: 33.6 g/dL (ref 30.0–36.0)
MCV: 86.7 fL (ref 78.0–100.0)
RBC: 2.97 MIL/uL — ABNORMAL LOW (ref 4.22–5.81)

## 2011-10-12 LAB — COMPREHENSIVE METABOLIC PANEL
ALT: 5 U/L (ref 0–53)
AST: 13 U/L (ref 0–37)
Alkaline Phosphatase: 63 U/L (ref 39–117)
CO2: 26 mEq/L (ref 19–32)
GFR calc Af Amer: >90 mL/min (ref >90)
GFR calc non Af Amer: >90 mL/min (ref >90)
Glucose, Bld: 72 mg/dL (ref 70–99)
Potassium: 3.4 mEq/L — ABNORMAL LOW (ref 3.5–5.1)
Sodium: 137 mEq/L (ref 135–145)
Total Protein: 5.6 g/dL — ABNORMAL LOW (ref 6.0–8.3)

## 2011-10-12 LAB — GLUCOSE, CAPILLARY: Glucose-Capillary: 91 mg/dL (ref 70–99)

## 2011-10-13 LAB — BASIC METABOLIC PANEL
Calcium: 8.8 mg/dL (ref 8.4–10.5)
Creatinine, Ser: 0.64 mg/dL (ref 0.50–1.35)
GFR calc non Af Amer: >90 mL/min (ref >90)
Glucose, Bld: 94 mg/dL (ref 70–99)
Sodium: 138 mEq/L (ref 135–145)

## 2011-10-13 LAB — CBC
Hemoglobin: 9.4 g/dL — ABNORMAL LOW (ref 13.0–17.0)
MCH: 28.9 pg (ref 26.0–34.0)
MCHC: 33.2 g/dL (ref 30.0–36.0)
MCV: 87.1 fL (ref 78.0–100.0)

## 2011-10-15 NOTE — H&P (Signed)
NAMEMarland Strickland  CORDARIOUS, [name]:  [id number]  MEDICAL RECORD NO.:  [id number]  LOCATION:  [facility]                         FACILITY:  [facility]  PHYSICIAN:  [name], MD         DATE OF BIRTH:  1966/04/15  DATE OF ADMISSION:  10/04/2011 DATE OF DISCHARGE:                             HISTORY & PHYSICAL   PRIMARY CARE PROVIDER:  [name], M.D.  CHIEF COMPLAINT:  Fall out of bed, tachycardia, and hypotension.  HISTORY OF PRESENT ILLNESS:  This is a 45 year old male with a history of a cardiac arrest and resultant anoxic brain injury, admissions for septic shock for aspiration pneumonia and hemorrhagic shock in February 2012, multiple admissions for hypertension and UTIs, questionable history of Addison's disease, paroxysmal atrial fibrillation, not on Coumadin, and chronic dysphagia status post PEG tube not currently in place who presents from a nursing home.  Unable to give full history at this time, but per discussion with ED physicians, he was at a nursing home at 3 a.m. and managed to get out of bed today given some residual left upper extremity function, he fell out of bed to the floor and hit an air conditioner and damaged his right back.  Per the nursing home, he apparently has done this before when he is ill.  He was sent to the emergency room where he was apparently confused on arrival, but complaining of some shortness of breath.  However, he was also noted to be hypoxic to the 80s on room air with a good plus and hypotensive and tachycardic.  His hypotension worsened to 60/40 in the ED and tachy to the 140s.  He was unable to give a full history.  His ED evaluation included a CT head and neck which were negative, but his white blood cell count was 14 and his lactate was 2.4.  A central line right IJ was placed.  They were wanting to get a CTA, but they were unable to given only a 22-gauge peripheral IV, so a central venous line was placed, but they  were unable to administer contrast and so ultimately wanted to order a V/Q scan, but this has not been done.  In the ED, he was given vancomycin, cefepime, and Flagyl empirically but from an unclear source.  Given questionable history of adrenal insufficiency, he was also given 100 mg of IV hydrocortisone and some Fentanyl and Reglan.  Of note, review of his chart shows that he was admitted several times for hypotension and tachycardia, some of them thought to be due to UTI, some of them thought due to aspiration pneumonia and in February 2012, he had a hemorrhagic shock due to a large groin hematoma in the setting of being treated with Coumadin.  REVIEW OF SYSTEMS:  Unable to be obtained at present due to the patient's mental status.  PAST MEDICAL HISTORY: 1. History of cardiac arrest with anoxic brain injury. 2. Admissions for septic shock from aspiration pneumonia and     hemorrhagic shock secondary to a large groin hematoma in February     2012. 3. Multiple admissions for hypertension and UTIs, previously treated     with Zyvox  and Zosyn for history of MRSA and VRE on and also stress     dose steroids for Addison's disease. 4. History of Addison's disease, adrenal insufficiency noted during     June 2011 admission for hypotension with a low AM cortisol followed     by an ACTH stim test that was mildly abnormal and started on     replacement therapy. 5. Paroxysmal atrial fibrillation, not on Coumadin secondary to a     large groin hematoma in February 2012. 6. Chronic dysphagia with a PEG tube placement, not currently in. 7. Hyperthyroidism, multinodular goiter. 8. History of recurrent UTIs. 9. Hyperlipidemia. 10.Anxiety/depression. 11.History of seizure disorders. 12.History of MRSA and VRE of unclear sources.  MEDICATIONS:  Medication list as provided by the pharmacist and includes, 1. MiraLax. 2. Norco which is hydrocodone/APAP 5/325 one tablet q.4 h. p.r.n.      pain. 3. Lorazepam 0.5 one tab t.i.d. p.r.n. anxiety. 4. Metoclopramide 10 mg q.i.d. 5. Artificial tears ophthalmic solution q.i.d. 6. Pantoprazole 40 mg b.i.d. 7. Omega-3 acid ethyl esters one cap twice daily. 8. Multivitamins methimazole 5 mg daily. 9. Nuedexta which is dextromethorphan with quinidine sulfate 1 cap     daily. 10.Citalopram 20 mg daily. 11.Aspirin 325.  ALLERGIES:  Listed are to, 1. LATEX. 2. MORPHINE SULFATE. 3. PENICILLIN. 4. PHENAZOPYRIDINE.  SOCIAL HISTORY:  The patient lives in a group home setting, otherwise social history is unable to be obtained at this time.  PHYSICAL EXAMINATION:  VITAL SIGNS:  Blood pressures most recently have been in the 80s to 90s with MAPs in the 60s.  His rate has come down from the 110, 120s down to 98 at present after getting 4-5 liters of normal saline.  He is 100% on room air and this I did personally by turning off his nasal cannula and watching his plethysmography which was quite good for several minutes and he is satting well on room air. GENERAL:  He is a large male who is very pale.  He is laying in the ED stretcher with his eyes closed, but will open his eyes very slightly to voice and to tactile stimulation.  He is able to answer questions with nods or shaking his head.  He appears ill.  He is bit diaphoretic as well. HEENT:  Pupils are equal, round, and reactive to light.  His extraocular muscles are intact.  His sclerae are quite clear and normal-appearing. His mouth is moist and normal-appearing as well.  I do not appreciate any oropharyngeal lesions. LUNGS:  Auscultated anterolaterally and have poor air movement, but his breath sounds are overall clear with no wheezes, crackles, or rhonchi appreciated. HEART:  Soft S1, S2 with difficult to auscultate heart sounds, but it is overall benign with no murmurs or gallops appreciated. ABDOMEN:  Obese but it is soft, nontender, nondistended, and benign.  It is not  peritoneal and not rigid.  There is a gauze pad overlying his epigastrium where it appears the PEG tube was and the patient is able to relate to me that the PEG tube has been removed when I ask him. EXTREMITIES:  Cool at the distal hands and feet, but they are not cyanotic and there is no clubbing.  There is no bilateral lower extremity edema.  They are not ice cold, but they are cool and a bit pale.  I am unclear what the baseline is. NEUROLOGIC:  He is not alert.  He does open his eyes very slightly to voice, but  then dozes back off to sleep again.  He does follow commands and will squeeze my fingers, but he is not spontaneously moving his extremities.  LABORATORY DATA:  White blood cell count 14.6 with 78% neutrophils and no noted bands, hematocrit is 41.0 which is above his baseline in the mid 30s in February 2012, and platelets appear clumped, but were grossly normal by their counts.  Chemistry; sodium 136, K 4.7, chloride 103, bicarb 18, BUN and creatinine are 18 and 1.18 with a baseline of 0.8-1.0, and glucose is 122.  LFTs are normal with an albumin of 3.8, calcium 9.2, and lactate 2.4.  Troponin negative x1.  UA is amber, cloudy, with specific gravity of 1.031 with 15 ketonuria, 30 proteinuria, and small leukoesterase andnegative nitrites with a few bacteria and 7-10 white blood cells.  Blood cultures x2 are pending and urine culture is pending.  I have asked for a CVO2, which shows a central venous oxygen saturation of 56.4.  IMAGING DATA:  Chest x-ray, there is no evidence of acute cardiopulmonary disease.  Repeat chest x-ray shows a right CVL with tip over the SVC and no pneumothorax or complication.  CT head shows no C-spine fracture dislocation with straightening of the normal cervical lordosis that may be positional.  Thyroid goiter is unchanged.  EKG is normal sinus rhythm with a normal axis.  P-waves are all grossly normal.  QRS are narrow at 84 milliseconds.   There are possible large Q- waves in III and smaller ones in aVF.  He has poor R-wave progression. ST-T segments are all grossly normal and T-waves are also all grossly normal.  He does have some quite small complexes in aVF in his lateral leads but these were possibly present previously.  Overall, this is a fairly unimpressive EKG and unchanged compared to February 2012, except that the rate has come down.  IMPRESSION:  This is a 45 year old male with a history of quadriplegia living in a nursing home, status post cardiac arrest with anoxic brain injury, multiple admissions for hypertension, tachycardia/shock variably due to aspiration pneumonia, urinary tract infections, and hemorrhagic shock, question of Addison's disease in an admission of June 2011, previously on steroids and paroxysmal atrial fibrillation not on Coumadin due to a groin hematoma who presents with an episode of falling out of bed and on arrival to the ED, he is found to be confused, hypotensive, tachycardic, and questionably hypoxic.  1. Hypotension, tachycardia.  The patient is currently improving with     4-5 liters of IV fluids and his rate is now down to the 80s and his     blood pressures in the 80s to 90s with MAPs in the 60s.  Therefore,     I think that he is responding to IV fluids and we will continue to     bolus him to see his response.  He has put out about 300 mL of     urine output in the last 5 hours by my calculations, although this     may be inaccurate.  His lactate is only minimally elevated at 2.4.  Differential for why he is ill includes adrenal insufficiency versus septic shock versus hypovolemia.  He is status post vancomycin, Zosyn, and cefepime and we will continue linezolid and Zosyn per prior admissions when he was treated with this for history of VRE and MRSA. We will continue stress dose steroids of 100 mg of IV hydrocortisone q.8 h., which basically obviates getting an AM cortisol  to  evaluate.  We will get a TSH given his history of goiter.  We will trend his lactates and VBGs and CVO2s.  CVP has been transduced and it was about 7-8, although the measurement in the ED has been quite difficult to obtain and the waveforms are really difficult so I am not sure of I believe it truly.  Basically, the patient appears to me a bit tenuous and if he decompensates any further, I am going to ask critical care to start pressors on him.  1. Hypoxia.  He reportedly was hypoxic to the 80s when his blood     pressures were in the 60s and they are persistently in the high 90s     to 100s, now that is blood pressures are a bit better.  I do not     think that this is a PE and his chest x-ray is clear.  We will     address the above issues given that his oxygen appears stable at     present.  1. History of atrial fibrillation.  He is currently not on Coumadin.     He is currently in sinus any ways.  We will just continue to     monitor.  1. History of hyperthyroidism.  He is currently on methimazole, but he     does not have agranulocytosis, so I do not think that this is a     neutropenic issue.  We will get a TSH to evaluate.  These are the most pressing medical issues at present.  We will discontinue all of his home meds at present until he is stabilized.  1. Fluid, electrolytes, and nutrition.  We are giving him active IV     fluid boluses for his hemodynamics.  Keep them n.p.o.  1. Prophylaxis.  Subcutaneous heparin, Zofran, and Tylenol.  1. IV access.  He has a right central line that was placed in the ED.     He also has some right-sided small bore peripherals.  CODE STATUS:  He is a presumed full at this point as this was unable to be addressed.  He will be admitted to the Step-Down Unit to [facility], team 8.          ______________________________ [name], MD     EB/MEDQ  D:  10/05/2011  T:  10/05/2011  Job:  [id number]  Electronically Signed by [name] MD on 10/15/2011 07:48:08 PM

## 2011-10-15 NOTE — H&P (Signed)
  NAMEYONG, GRIESER NO.:  [id number]  MEDICAL RECORD NO.:  [id number]  LOCATION:  2104                         FACILITY:  [facility]  PHYSICIAN:  [name], MD         DATE OF BIRTH:  03/21/1966  DATE OF ADMISSION:  10/04/2011 DATE OF DISCHARGE:                             HISTORY & PHYSICAL   ADDENDUM:  This is an addendum to the H and P originally dictated as the patient was subsequently transferred to the Critical Care Team.  The patient had received approximately 5 liters of normal saline with improvement of his pulses to the 90s, however, was still hypotensed to the 80s and 90s.  Repeat CBC showed that the patient had a new anemia with drop in his hematocrit from approximately 43 to 25 that I believe was only somewhat due to dilution but possibly also hemorrhagic. Therefore given his complexity and his level of acuity,  I requested that Critical Care admit the patient which they graciously agreed to.      ______________________________ [name], MD     EB/MEDQ  D:  10/05/2011  T:  10/05/2011  Job:  [id number]  Electronically Signed by [name] MD on 10/15/2011 07:48:11 PM

## 2011-10-22 NOTE — Discharge Summary (Signed)
[name], [name] NO.:  [id number]  MEDICAL RECORD NO.:  [id number]  LOCATION:  5529                         FACILITY:  [facility]  PHYSICIAN:  Osvaldo Shipper, MD     DATE OF BIRTH:  1966-09-01  DATE OF ADMISSION:  10/04/2011 DATE OF DISCHARGE:  10/13/2011                        DISCHARGE SUMMARY - REFERRING   PRIMARY CARE PHYSICIAN:  Dr. Ethlyn Gallery.  DISCHARGE DIAGNOSES: 1. Hemorrhagic shock. 2. Acute blood loss anemia, status post 2 units of packed red blood     cells and 1 unit of plasma. 3. Large retroperitoneal bleed with hematoma in the right scapular     region from a fall. 4. Hypotension secondary to hemorrhagic shock, acute blood loss     anemia, and large retroperitoneal bleed. 5. Addison disease, on steroids. 6. Paroxysmal atrial fibrillation, not on Coumadin. 7. Hyperthyroidism. 8. Recurrent urinary tract infections. 9. Anxiety and depression. 10.History of seizure disorder. 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 12.Quadriplegia. 13.Anoxic brain injury. 14.Fecal impaction with chronic constipation.  CONSULTATIONS:  The patient was seen by Critical Care at the time of admission.  DISCHARGE MEDICATIONS: 1. Docusate sodium 100 mg tablets 1 tablet by mouth twice daily. 2. Ensure pudding 1 pudding by mouth twice daily. 3. Ferrous sulfate 325 mg 1 tablet by mouth 3 times a day. 4. Fleet Enema, take 1 daily for 3 days and then as needed for     constipation. 5. Hydrocortisone 10 mg tablets take 15 mg by mouth daily. 6. Hydrocortisone 10 mg tablets take 1 tablet by mouth daily at     bedtime. 7. Thick-It food thickener 1 g by mouth as needed with liquids. 8. MiraLax 17 g by mouth twice daily. 9. Artificial tears ophthalmic solution 1 drop o.p. 4 times daily. 10.Citalopram 20 mg 1 tablet by mouth daily. 11.Lorazepam 0.5 mg 1 tablet by mouth 3 times a day as needed for  anxiety. 12.Methimazole 5 mg 1 tablet by mouth daily. 13.Multivitamin 1 tablet by mouth daily. 14.Norco 5/325 one tablet by mouth every 4 hours as needed, the     patient has been given a prescription for 5 days as needed for     pain. 15.Nuedexta 1 capsule by mouth daily. 16.Omega-3 acid ethyl esters 1 capsule 1 g by mouth twice daily. 17.Pantoprazole packet 40 mg 1 packet by mouth twice daily. 18.Stop taking metoclopramide 10 mg 1 tablet 4 times a day.  PROCEDURES: 1. The patient had a central line placed on October 04, 2011.  Followup     chest x-ray showed interval placement of right central venous     catheter with tip over the SVC.  No pneumothorax or complication     identified. 2. Transfusion of packed red blood cells (2 units), plasma (1 unit).  PERTINENT RADIOLOGICAL EXAMS:  The patient had a CT of his C-spine and head without contrast.  CT head shows age advanced atrophy, no intracranial abnormality.  CT C-spine shows no C-spine fracture or dislocation.  Thyroid goiter is unchanged.   He also had a CT of hisabdomen and pelvis on October 8th that showed a large hematoma extending the right posterolateral chest and  abdominal wall.  He also had a small oval complex fluid density along the anterior abdominal wall above the level of the umbilicus, this was likely where his PEG tube previously was.    On October 07, 2011, he had a chest x-ray that shows central pulmonary vascular prominence without frank pulmonary edema.    On October 12, 2011, he had an abdominal x-ray that shows prominence of stool over the rectosigmoid colon particularly in the rectum may indicate fecal impaction.  HISTORY AND BRIEF HOSPITAL COURSE:  [name] is a 45 year old male with quadriplegia, who lives in a nursing home.  Unfortunately, he managed to get himself out of bed on the day of admission because he has some residual extremity function, he fell out of bed to the floor, and then began to  complain of shortness of breath.  He was found to be hypoxic in the 80s on room air.  He was also hypotensive with tachycardia.  His blood pressure worsens to 60/40 and he was tachy in the 140s, unable to give a history when he was seen by our admitting physician in the emergency department.  Because his blood pressure would not respond despite 4 L of bolus IV fluids, Critical Care was called and he was admitted to their service with hemorrhagic shock.  He was found to have leukocytosis, fever, hypotension, and transient hypoxemia with mild acidosis consistent with sepsis.  The patient developed a fever and an infection related workup was undertaken.  Chest x-ray demonstrated no evidence of pneumonia.  The patient had a history of urinary tract infections.  Consequently, the source of his sepsis was suspected to be a urinary tract infection.  The patient was placed on vancomycin, Cipro, and cephalosporin.  The patient's blood pressure remained low.  His hemoglobin was also low and it was felt that he would benefit from a transfusion.  Consequently, he received 1 unit of plasma and 2 units of packed red blood cells.  His hemoglobin stabilized at approximately 9 and then today it is 9.4.    The patient underwent a CT and was found to have a large retroperitoneal bleed with hematoma secondary to his fall.  It was felt that his hypotension was most likely due to acute blood loss from his fall.  His antibiotics were discontinued on October 05, 2011.  The patient continued to be monitored closely in the ICU, and unfortunately, his hemoglobin continued to drop, it reached a low of 6.5.  He continued to be on pressors through October 10.  Fortunately, he had no seizures during his episode of acute blood loss anemia despite his history of seizure disorder.  On October 12, it was noted that the patient had an episode of paroxysmal atrial fibrillation the previous evening.  He received amiodarone  drop and converted quickly to sinus rhythm.  He was transferred out of the ICU to Step-Down Unit on October 12.  On October 13, his amiodarone drops were able to be discontinued as he was in normal sinus rhythm.  His hypovolemic shock had resolved.  His hemoglobin appeared to be stable.  It was decided that because of his history of Addison disease and hypoglycemia, steroid should be restarted and he was placed on low-dose hydrocortisone.    On October 14, he was able to be transferred to a regular floor.  His hemorrhagic shock had completely resolved.  He was in normal sinus rhythm.  His blood pressure was stable.  On October 15, we evaluated  him for possible discharge.  He developed abdominal pain, became diaphoretic, and once again became hypotensive.  Rapid response was called.  His blood pressure was 90/60, temperature 99.5.  An acute abdominal x-ray was taken and the patient was found to have a fecal impaction.  He was complaining of abdominal pain.  The patient was disimpacted manually and with Fleet Enemas, they had good results.  This morning on the morning of October 16, the patient has been evaluated by the attending physician Dr. Rito Ehrlich, who feels that he is stable and ready for discharge.  He will be discharged back to [facility].  He will need daily enemas for the next 3 days to ensure that his bowels are cleaned out.  Dr. Neoma Laming has written him for a more aggressive bowel regimen to keep him regular.  The patient's abdominal pain appears to have resolved and he is going to be discharged to [facility] this afternoon.  PHYSICAL EXAMINATION:  GENERAL: The patient is awake, but drowsy. VITAL SIGNS:  Temperature 97.9, pulse 66, respirations 18, blood pressure 110/68, and O2 sats 95%. HEENT:  Head is atraumatic and normocephalic.  Eyes are anicteric with pupils are equal and round.  Nose shows no nasal discharge or exterior lesions.  Mouth has  moist mucous membranes with moderate dentition. NECK:  Supple with midline trachea.  No JVD.  No lymphadenopathy. CHEST:  Demonstrates no accessory muscle use.  He has no wheezes or crackles to my exam. HEART:  Regular rate and rhythm without obvious murmurs, rubs, or gallops. ABDOMEN:  Obese, nontender.  He has a gauze bandage over the place where his PEG tube previously was.  There was no sign of infection. EXTREMITIES:  No clubbing, cyanosis, or edema. PSYCHIATRIC:  The patient is drowsy now, sometimes during this hospitalization he has been slightly inappropriate in his comments to the nurses, but overall his demeanor is cooperative.  LABORATORY DATA:  The patient's white count today is 5.6, hemoglobin 9.4, hematocrit 28.3, MCV value 87.1, platelets 200.  Sodium 138, potassium 4.0, chloride 106, bicarb 26, glucose 94, BUN 8, creatinine 0.64, calcium 8.8, lipase 24.  The patient had a TSH done on October 12 was 0.949.  He also had a cortisol level done on October 12 that was 2.4.  BRIEF DISCHARGE INSTRUCTIONS:  The patient will be discharged to [facility] today.  Diet will be as before, he has to have a pureed diet with nectar-thick liquids.  FOLLOWUP APPOINTMENTS:  We recommend that the patient have a CBC in 1 week to monitor his blood counts, would also ask that his primary care physician decide at some point in the future whether or not his aspirin needs to be restarted.  Further his blood pressure medications have been discontinued as they were not needed here in the hospital, but as the patient improved and regained his strength his blood pressure medications may need to be restarted.     Stephani Police, PA  Patient was seen and examined by self. Agree with above note. ______________________________ Osvaldo Shipper, MD    MLY/MEDQ  D:  10/13/2011  T:  10/13/2011  Job:  [id number]  cc:   [name], M.D.  Electronically Signed by  Algis Downs PA on 10/16/2011 09:13:06 AM Electronically Signed by Osvaldo Shipper MD on 10/22/2011 07:20:19 PM

## 2011-11-06 IMAGING — CT CT CERVICAL SPINE W/O CM
2 of 8 series · 5 of 20 positions shown, 6 images · non-contrast
Comparison: 01/31/2011. 06/18/2010.

CT HEAD

CLINICAL DATA: Found down.  Hypotensive.  Decreased level of
consciousness.

CT HEAD WITHOUT CONTRAST
CT CERVICAL SPINE WITHOUT CONTRAST
TECHNIQUE: Multidetector CT imaging of the head and cervical spine
was performed following the standard protocol without intravenous
contrast.  Multiplanar CT image reconstructions of the cervical
spine were also generated.

[Series 801: cor · coronal · 0.41mm/px · 3 of 56 slices shown]
[im 12/56  bone]
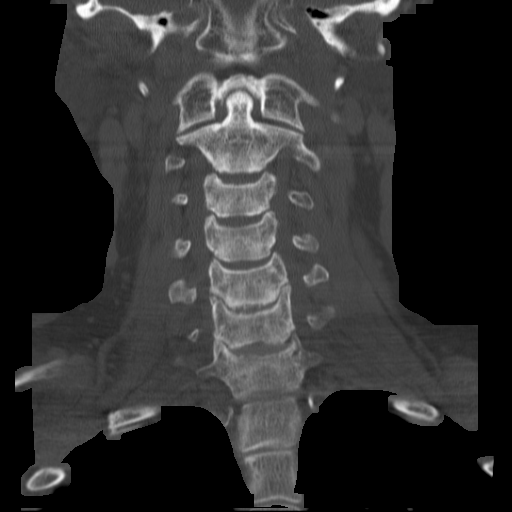
[im 23/56  bone]
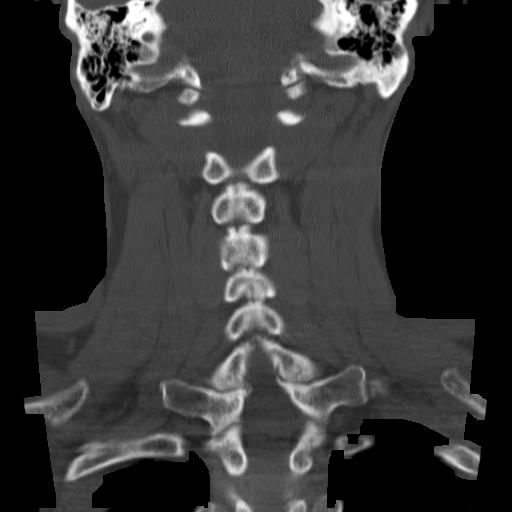
[im 34/56  bone]
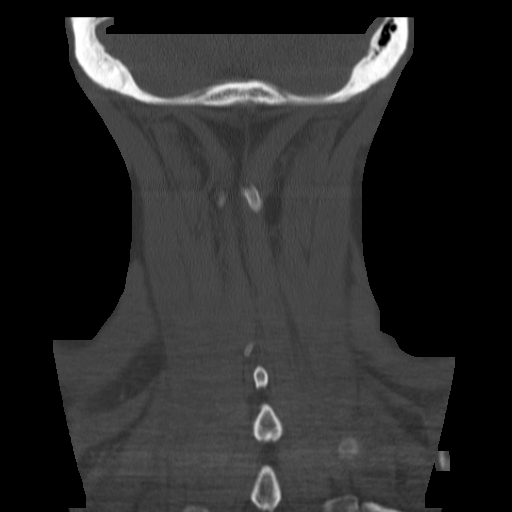

[Series 802: orthog · axial · 0.41mm/px · z∈[-288,-224]mm · 2 of 104 slices shown, 3 images]
[im 35/104  soft-tissue]
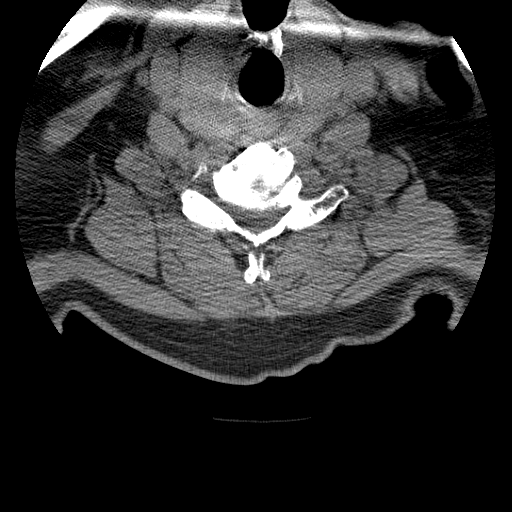
[im 35/104  bone]
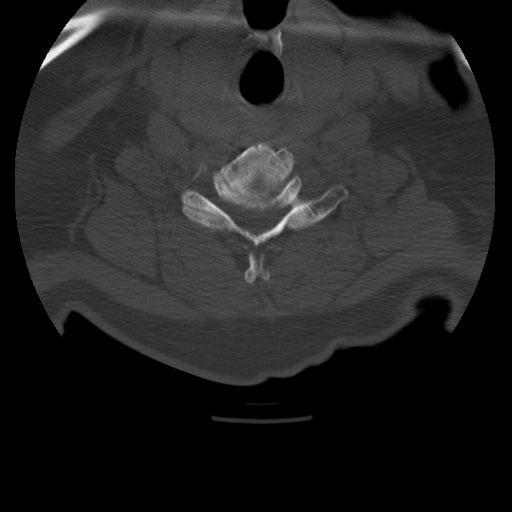
[im 69/104  bone]
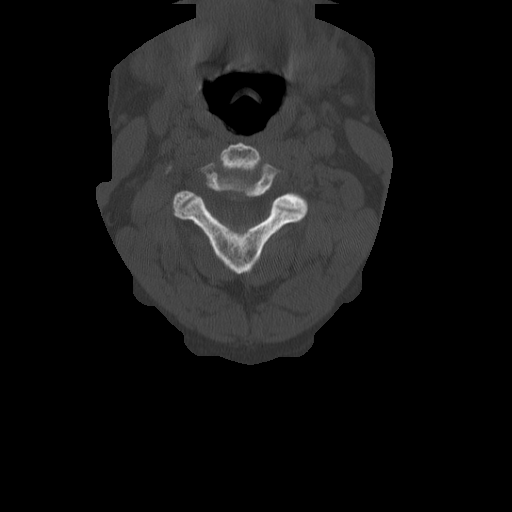

[5 of 20 positions shown; findings below may reference images not displayed]

FINDINGS: Mild motion artifact is present on the examination.  Some
slices were repeated with minimal improvement.  Hyperostosis
frontalis interna is present.  Age advanced atrophy is nonspecific
but can be associated with HIV or alcoholism.  Mastoid air cells
appear clear.  The calvarium appears intact.  Paranasal sinuses are
clear.
IMPRESSION: Age advanced atrophy.  No acute intracranial abnormality.

CT CERVICAL SPINE
FINDINGS: Straightening of the normal cervical lordosis.  No
cervical spine fracture, subluxation, or dislocation.
Craniocervical junction appears within normal limits.  There is
anterior translation of the mandibular condyles bilaterally with
the condyles overlying articular eminence.  Atlantodental
degenerative disease.  Mild motion artifact is present on the
examination.  C5-C6 and C6-C7 predominant cervical spondylosis is
present. The thyroid gland is enlarged, similar to prior compatible
with goiter.
IMPRESSION: 1.  No cervical spine fracture or dislocation.  Straightening of
the normal cervical lordosis may be positional.  The lower cervical
spondylosis appears unchanged compared to prior exam of 06/18/2010.
[DATE].  Thyroid goiter unchanged.

## 2011-11-06 IMAGING — CR DG CHEST 1V PORT
1 series · 1 of 1 positions shown · non-contrast
Comparison: 10/04/2011 at 3999 hours

CLINICAL DATA: Post central line placement

PORTABLE CHEST - 1 VIEW

[view not recorded]
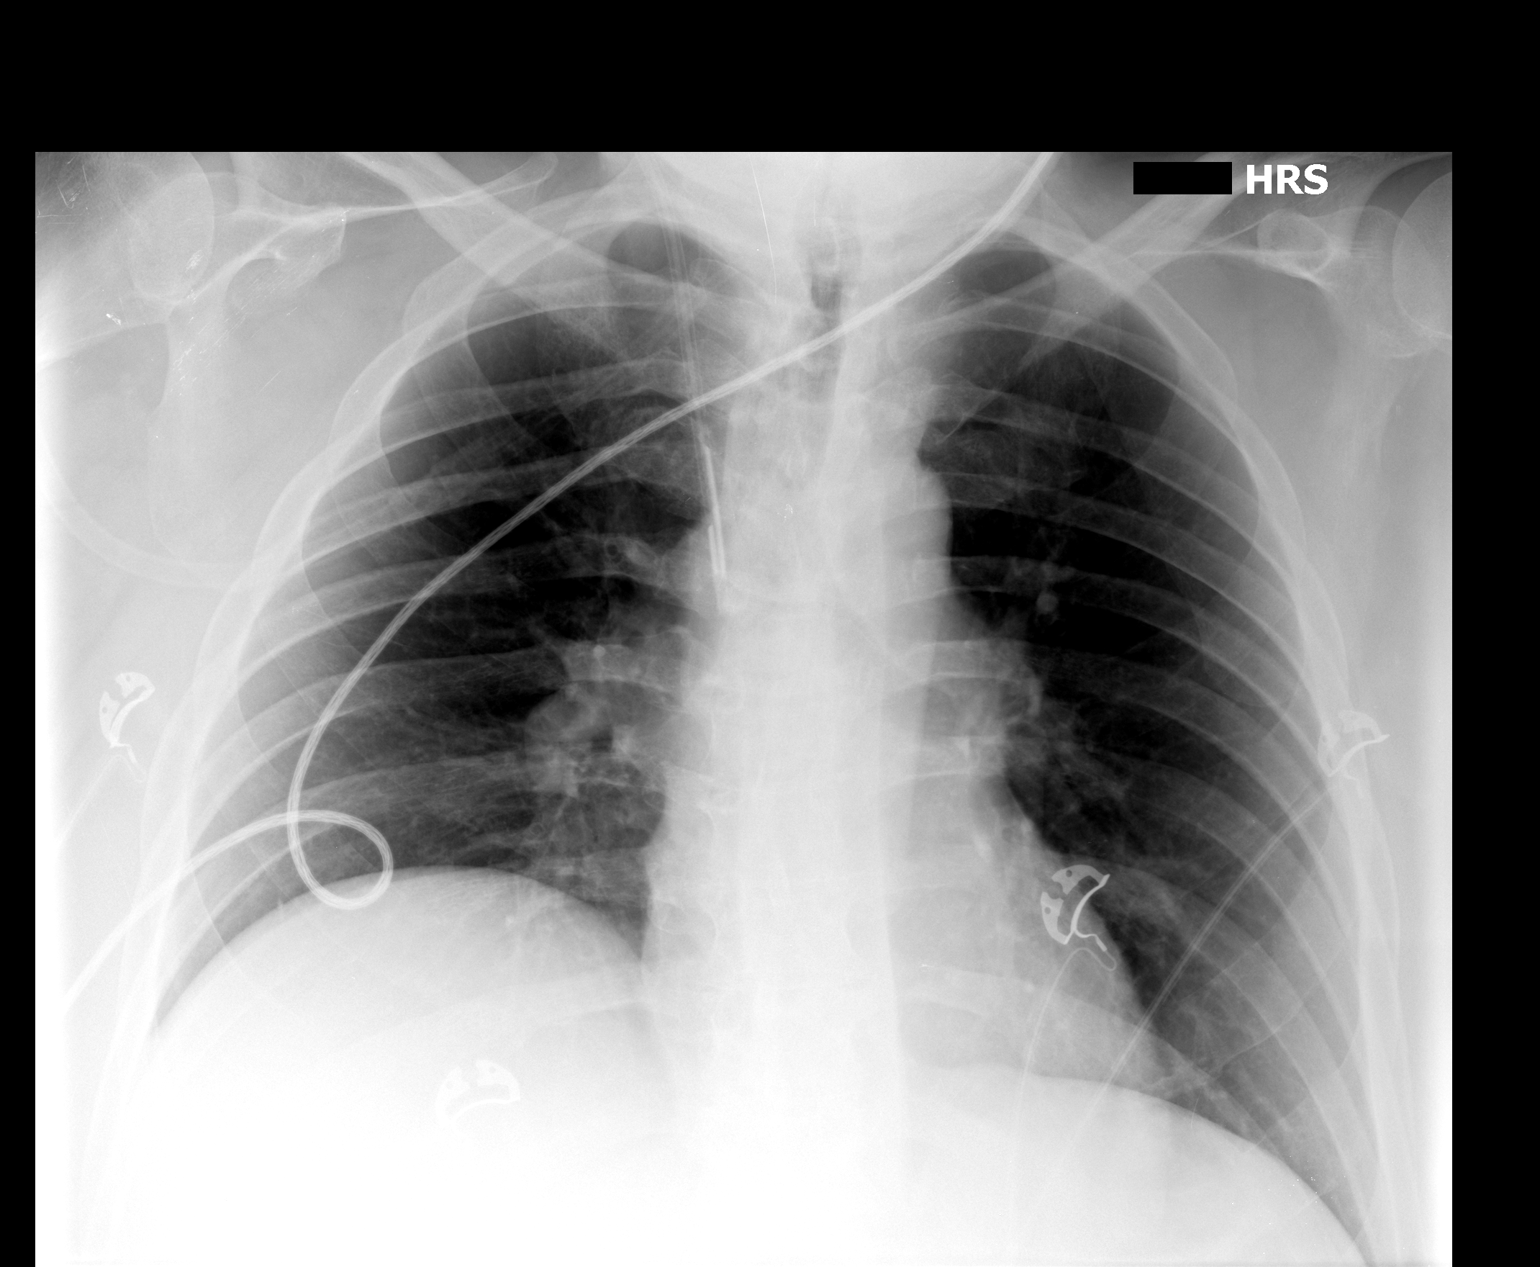

[1 of 1 positions shown; findings below may reference images not displayed]

FINDINGS: Interval placement of a right central venous catheter
with tip over the mid SVC region.  No pneumothorax.  No developing
fluid collections.  Heart size and pulmonary vascularity are
normal.  No focal airspace consolidation in the lungs.  No blunting
of costophrenic angles.
IMPRESSION: Interval placement of right central venous catheter with tip over
the SVC.  No pneumothorax or complication identified.

## 2011-11-06 IMAGING — CR DG CHEST 1V PORT
1 series · 1 of 1 positions shown · non-contrast
Comparison: 02/19/2011

CLINICAL DATA: 44-year-old male with shortness of breath.

PORTABLE CHEST - 1 VIEW

[AP]
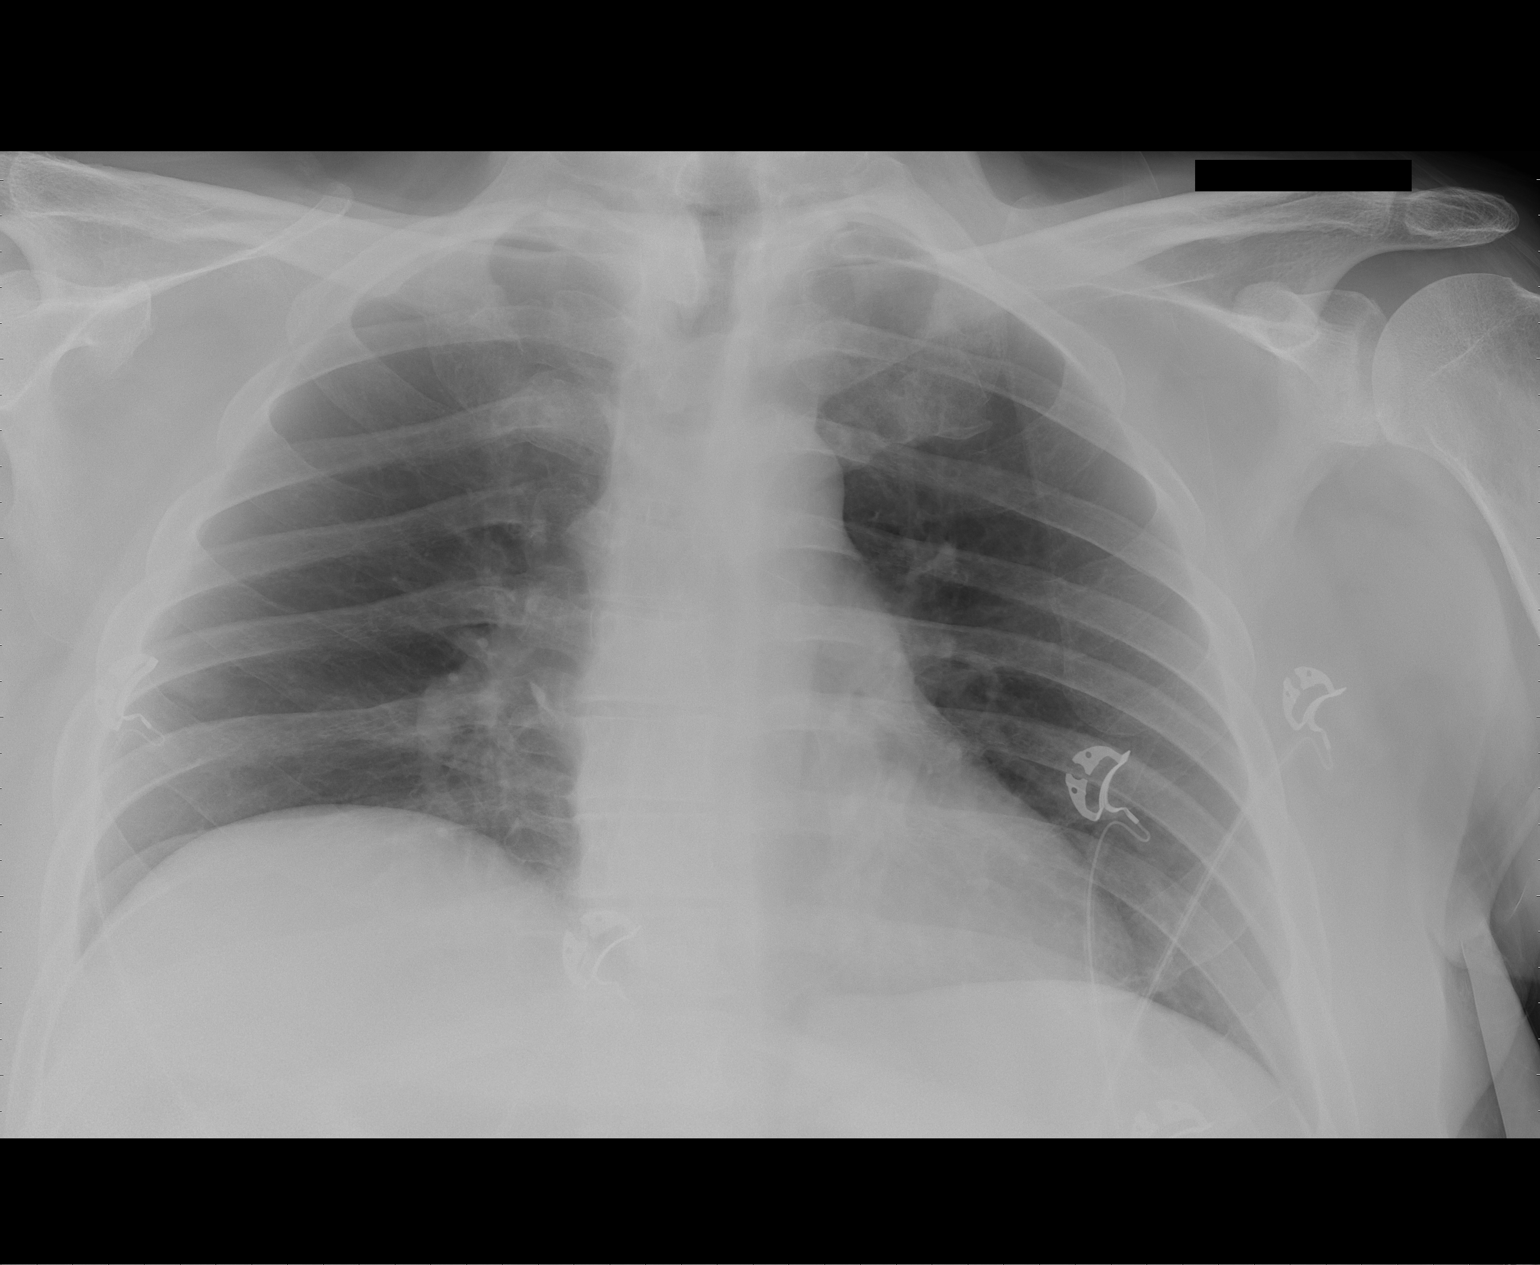

[1 of 1 positions shown; findings below may reference images not displayed]

FINDINGS: The cardiomediastinal silhouette is unremarkable.
Mild elevation of the right hemidiaphragm is again noted.
There is no evidence of focal airspace disease, pulmonary edema,
pulmonary nodule/mass, pleural effusion, or pneumothorax.
No acute bony abnormalities are identified.
IMPRESSION: No evidence of acute cardiopulmonary disease.

## 2011-11-07 IMAGING — CT CT ABD-PELV W/O CM
4 of 8 series · 14 of 32 positions shown, 19 images · non-contrast
Comparison: 02/18/2011 CT

CLINICAL DATA: Hemoglobin dropped.

CT ABDOMEN AND PELVIS WITHOUT CONTRAST
TECHNIQUE: Multidetector CT imaging of the abdomen and pelvis was
performed following the standard protocol without intravenous
contrast.

[Series 2: routine abdomen · axial · 0.84mm/px · z∈[-407,-82]mm · 4 of 109 slices shown, 9 images]
[im 22/109  soft-tissue]
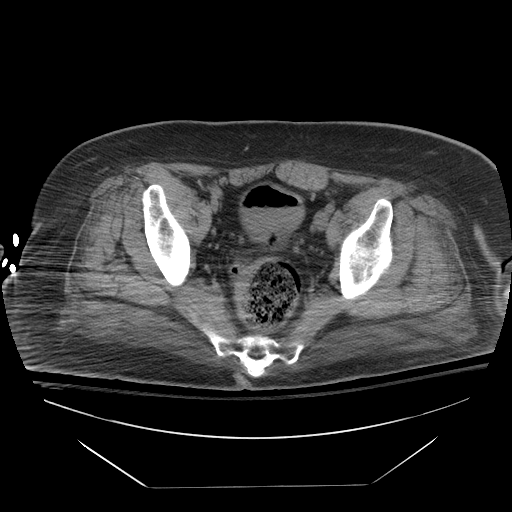
[im 22/109  lung]
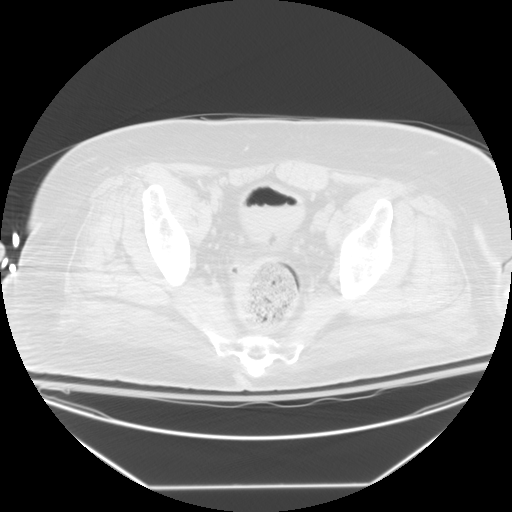
[im 22/109  bone]
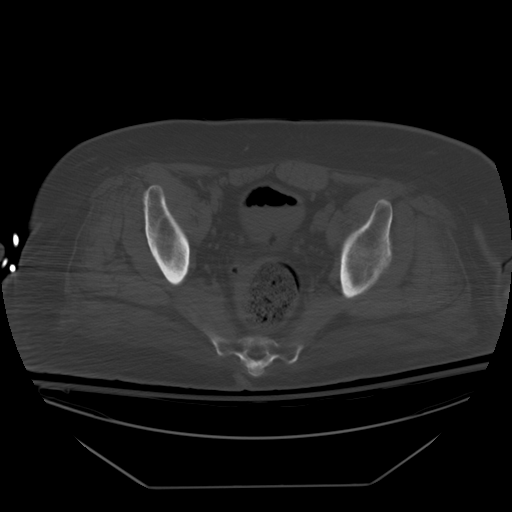
[im 44/109  soft-tissue]
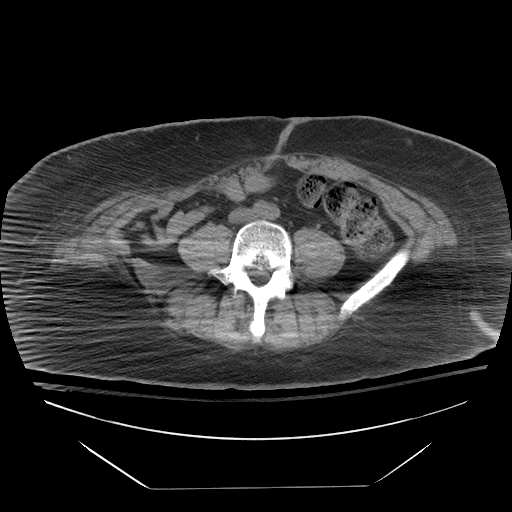
[im 44/109  lung]
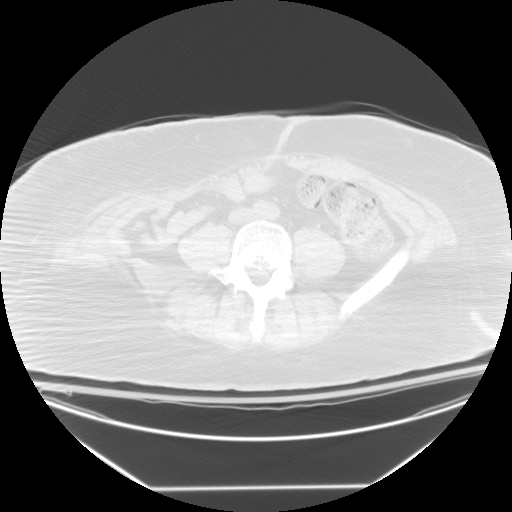
[im 65/109  soft-tissue]
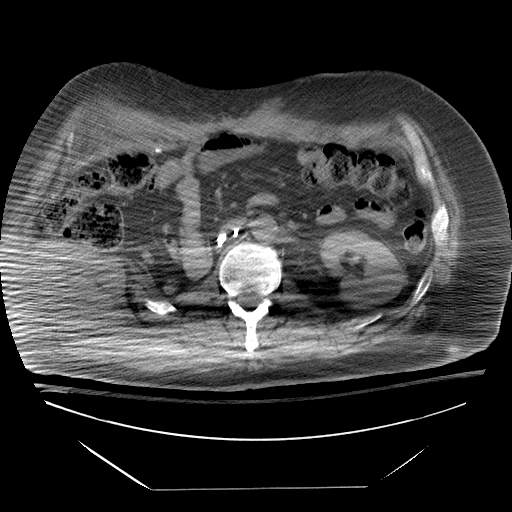
[im 65/109  lung]
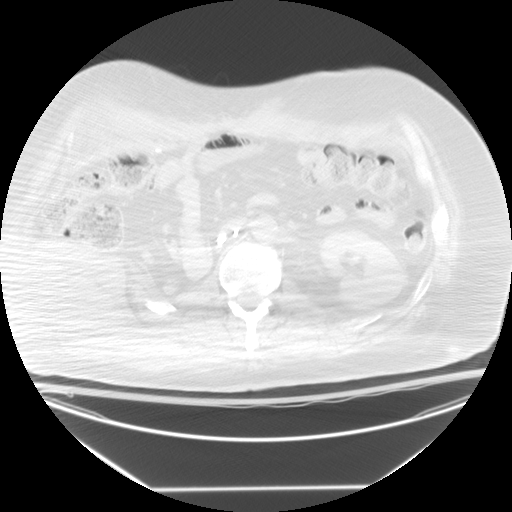
[im 87/109  soft-tissue]
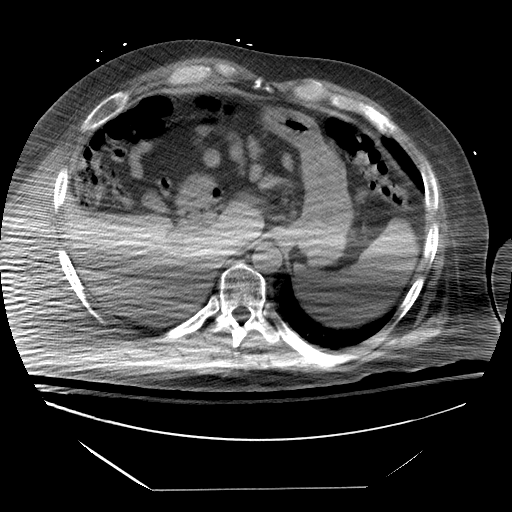
[im 87/109  lung]
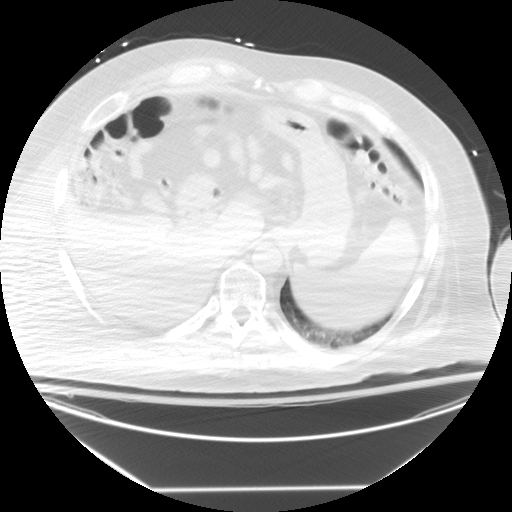

[Series 400: coronals · coronal · 1.04mm/px · 2 of 86 slices shown]
[im 29/86  soft-tissue]
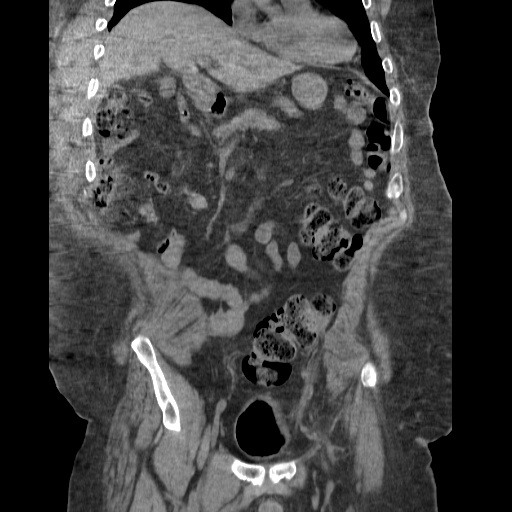
[im 57/86  soft-tissue]
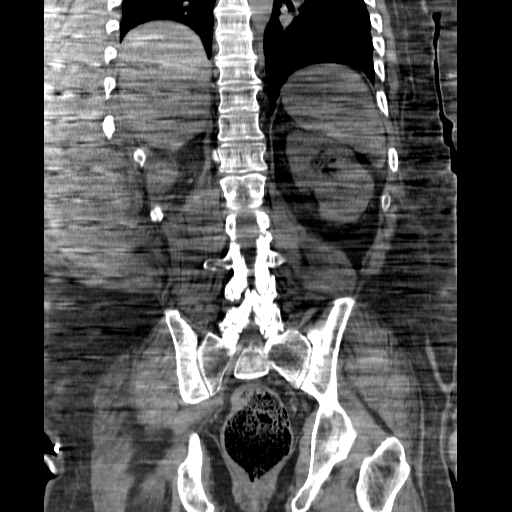

[Series 401: sagittals · sagittal · 1.04mm/px · 4 of 140 slices shown (1 of 2)]
[im 28/140  soft-tissue]
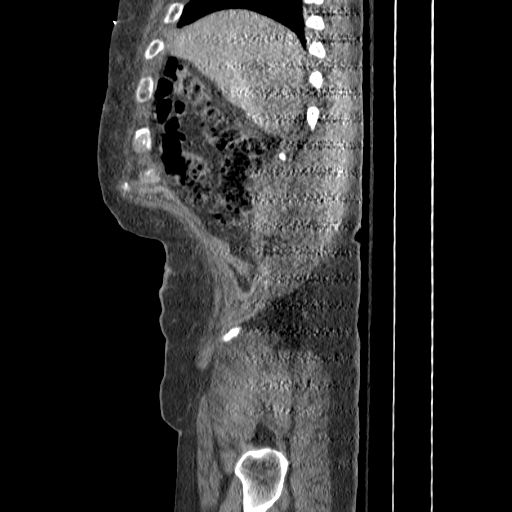
[im 56/140  soft-tissue]
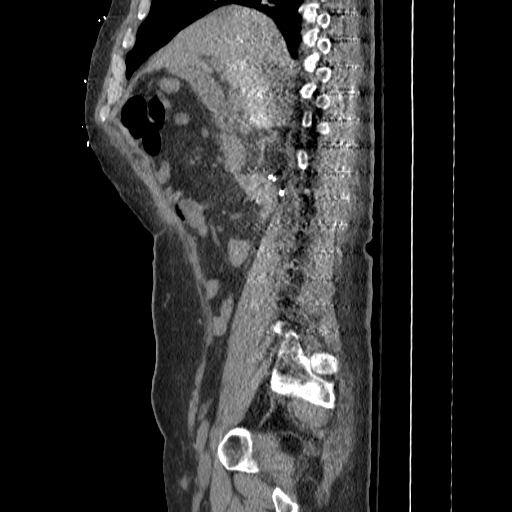
[im 84/140  soft-tissue]
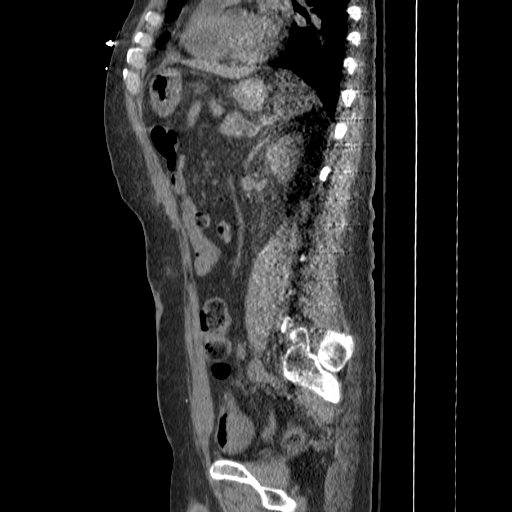
[im 112/140  soft-tissue]
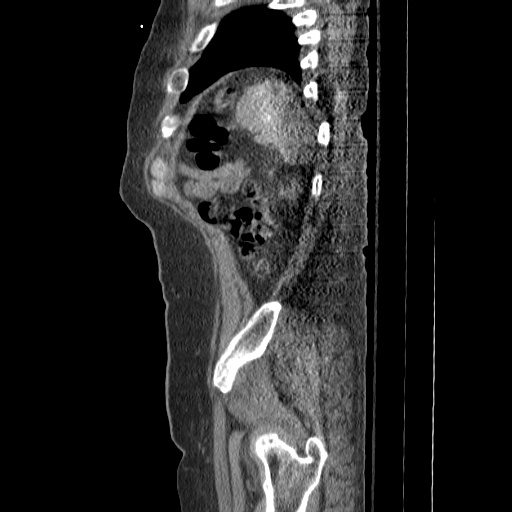

[Series 701: sagittals · sagittal · 0.84mm/px · 4 of 143 slices shown (2 of 2)]
[im 24/143  soft-tissue]
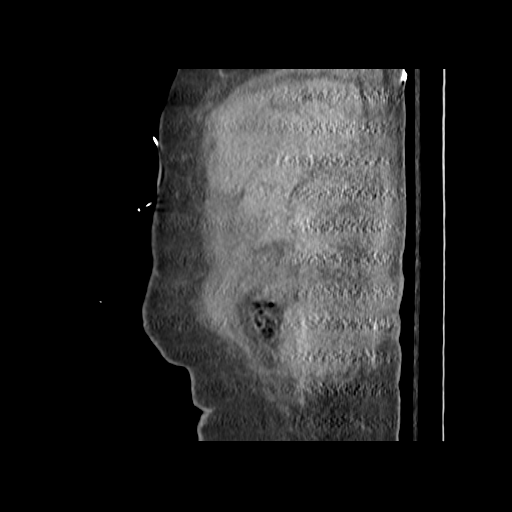
[im 48/143  soft-tissue]
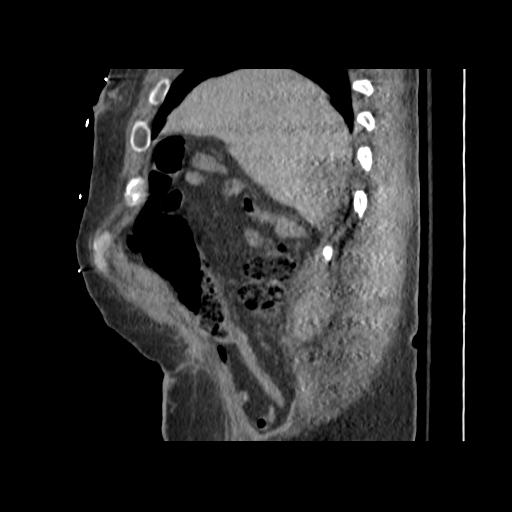
[im 72/143  soft-tissue]
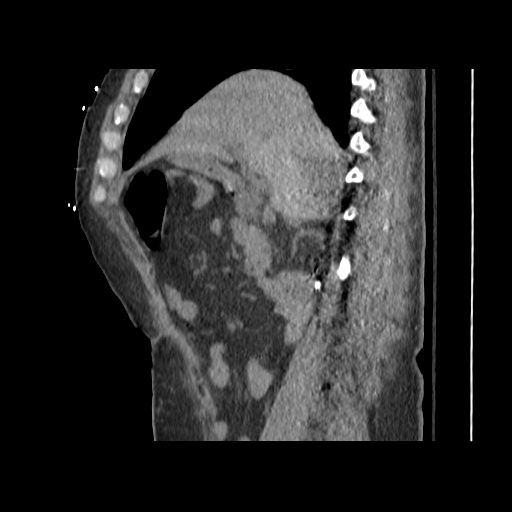
[im 95/143  soft-tissue]
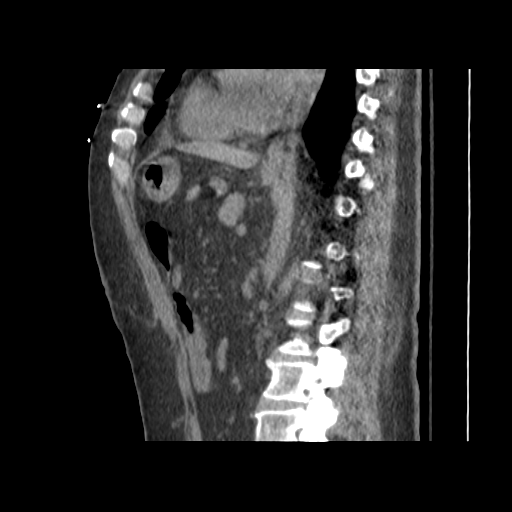

[14 of 32 positions shown; findings below may reference images not displayed]

FINDINGS: Limited images through the lung bases demonstrate no
significant appreciable abnormality. The heart size is within
normal limits. No pleural or pericardial effusion.

There is a large hematoma which collects along the right posterior
chest wall and extends inferiorly along the posterolateral
musculature nearly to the level of the iliac crest.  The dimensions
measure up to 24 x 11 cm in maximum craniocaudal by transverse
size. There is an oval complex fluid density along the anterior
abdominal wall musculature on image 38 measuring 1.9 x 2.6 cm.

Organ evaluation is limited without intravenous contrast.  The
study is further degraded by patient extremity positioning which
results in streak artifact.  Within this limitation, no definite
abnormality identified within the liver, spleen, pancreas, and
adrenal glands.  Status post right nephrectomy.  Status post
cholecystectomy. Calcifications within the surgical bed is similar
to prior.

No left renal hydronephrosis or hydroureter.

Air within the bladder is nonspecific given the presence of a Foley
catheter.

Large amount of stool within the rectum.  No bowel obstruction.
Right lateral abdominal wall laxity. No acute osseous lesion.
Grade 1 anterolisthesis of L5-1 S1.
IMPRESSION: Large hematoma extending along the right posterolateral chest and
abdominal wall.

Small oval complex fluid density along the anterior abdominal wall
above the level of the umbilicus of indeterminate sterility.

Discussed via telephone with Dr. Goldberg at [DATE] a.m. on
10/05/2011.

## 2011-11-09 IMAGING — CR DG CHEST 1V PORT
1 series · 1 of 1 positions shown · non-contrast
Comparison: 10/04/2011.

CLINICAL DATA: Hematoma.

PORTABLE CHEST - 1 VIEW

[view not recorded]
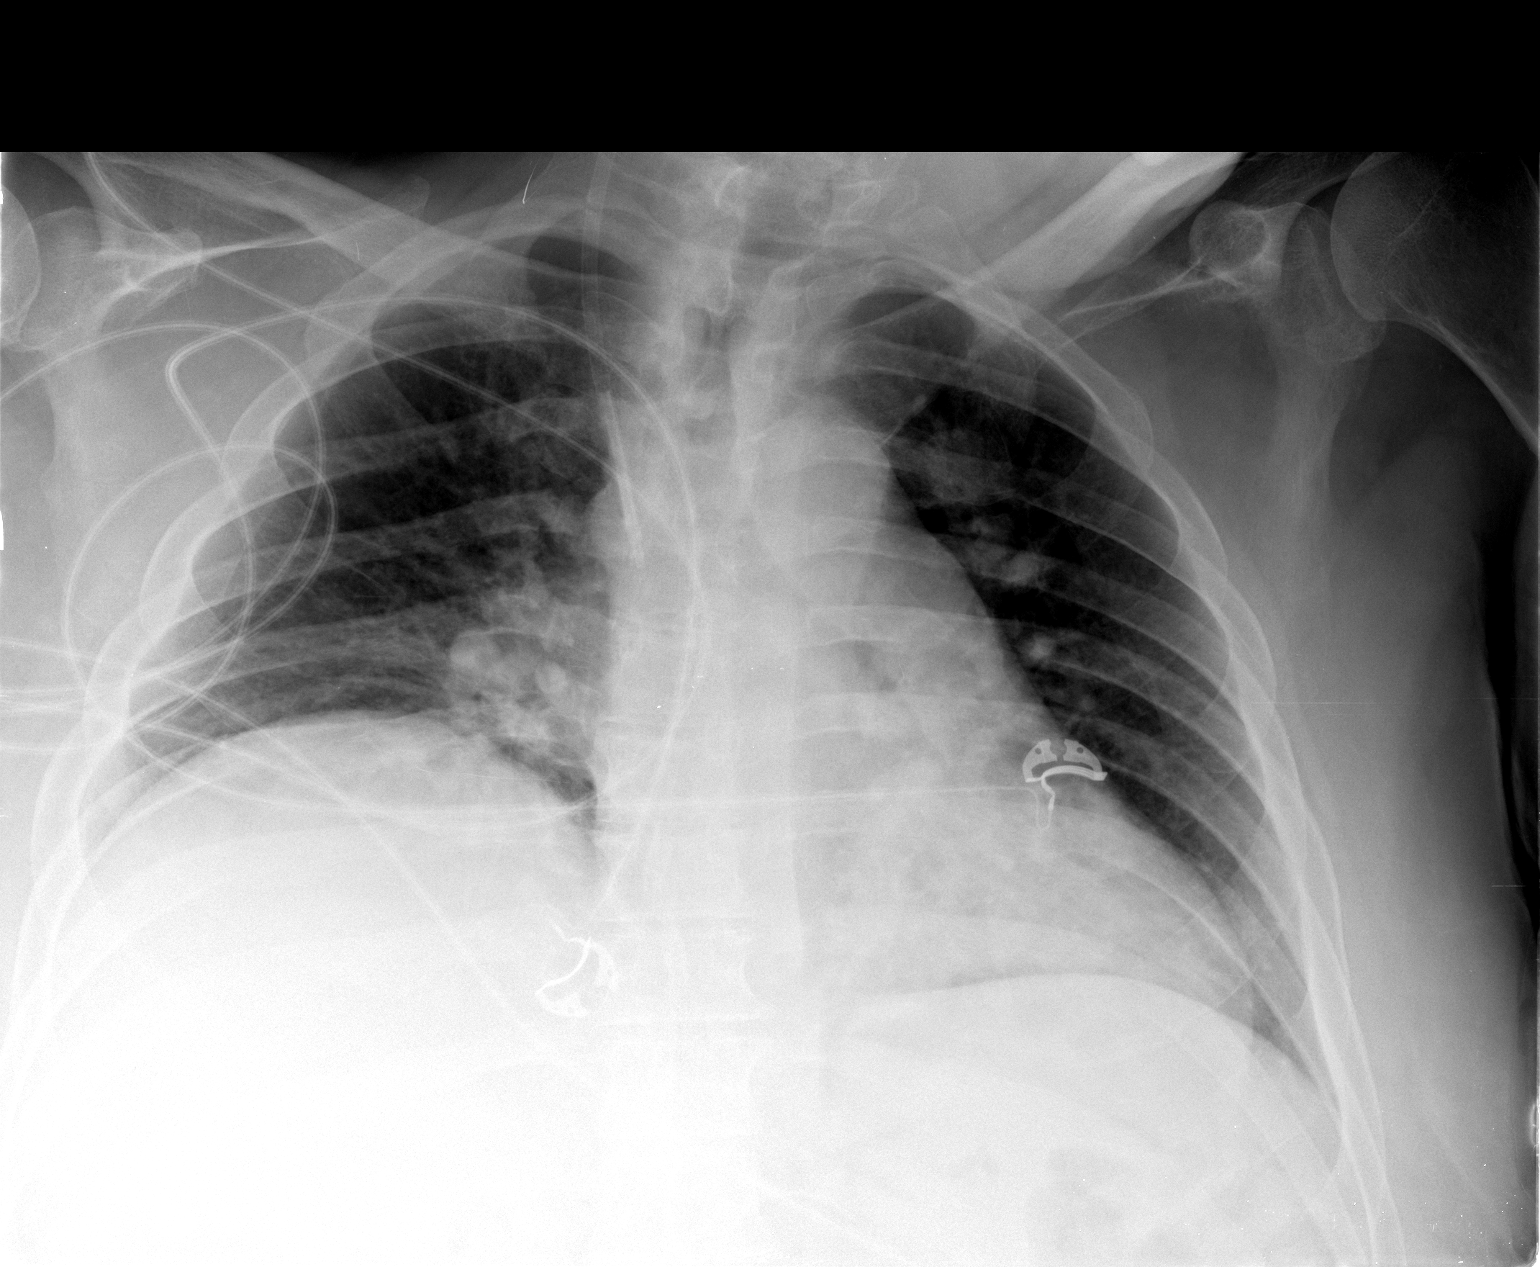

[1 of 1 positions shown; findings below may reference images not displayed]

FINDINGS: Right internal jugular catheter tip projects over the
proximal aspect of the superior vena cava.  No gross pneumothorax.

Elevated right hemidiaphragm. Right base subsegmental atelectasis.

Fullness of the right hilum.  Underlying mass or adenopathy not
excluded.  Central pulmonary vascular prominence without frank
pulmonary edema.

Heart size top normal.
IMPRESSION: Right internal jugular catheter tip projects over the proximal
aspect of the superior vena cava.  No gross pneumothorax.

Elevated right hemidiaphragm.   Right base subsegmental
atelectasis.

Fullness of the right hilum.  Underlying mass or adenopathy not
excluded.

Central pulmonary vascular prominence without frank pulmonary
edema.

## 2011-11-12 ENCOUNTER — Encounter (HOSPITAL_COMMUNITY): Payer: Self-pay | Admitting: Adult Health

## 2011-11-12 ENCOUNTER — Emergency Department (HOSPITAL_COMMUNITY): Payer: Medicare Other

## 2011-11-12 ENCOUNTER — Inpatient Hospital Stay (HOSPITAL_COMMUNITY)
Admission: EM | Admit: 2011-11-12 | Discharge: 2011-11-20 | DRG: 683 | Disposition: A | Discharge status: Skilled Nursing Facility | Payer: Medicare Other | Attending: Internal Medicine | Admitting: Internal Medicine

## 2011-11-12 ENCOUNTER — Other Ambulatory Visit: Payer: Self-pay

## 2011-11-12 DIAGNOSIS — E870 Hyperosmolality and hypernatremia: Secondary | ICD-10-CM

## 2011-11-12 DIAGNOSIS — Z23 Encounter for immunization: Secondary | ICD-10-CM

## 2011-11-12 DIAGNOSIS — N179 Acute kidney failure, unspecified: Principal | ICD-10-CM

## 2011-11-12 DIAGNOSIS — R000 Tachycardia, unspecified: Secondary | ICD-10-CM

## 2011-11-12 DIAGNOSIS — B964 Proteus (mirabilis) (morganii) as the cause of diseases classified elsewhere: Secondary | ICD-10-CM | POA: Diagnosis present

## 2011-11-12 DIAGNOSIS — N19 Unspecified kidney failure: Secondary | ICD-10-CM

## 2011-11-12 DIAGNOSIS — E860 Dehydration: Secondary | ICD-10-CM | POA: Diagnosis present

## 2011-11-12 DIAGNOSIS — G931 Anoxic brain damage, not elsewhere classified: Secondary | ICD-10-CM

## 2011-11-12 DIAGNOSIS — I959 Hypotension, unspecified: Secondary | ICD-10-CM

## 2011-11-12 DIAGNOSIS — Z8674 Personal history of sudden cardiac arrest: Secondary | ICD-10-CM

## 2011-11-12 DIAGNOSIS — R1310 Dysphagia, unspecified: Secondary | ICD-10-CM | POA: Diagnosis present

## 2011-11-12 DIAGNOSIS — N390 Urinary tract infection, site not specified: Secondary | ICD-10-CM

## 2011-11-12 DIAGNOSIS — R4182 Altered mental status, unspecified: Secondary | ICD-10-CM

## 2011-11-12 DIAGNOSIS — D696 Thrombocytopenia, unspecified: Secondary | ICD-10-CM | POA: Diagnosis present

## 2011-11-12 DIAGNOSIS — E46 Unspecified protein-calorie malnutrition: Secondary | ICD-10-CM

## 2011-11-12 DIAGNOSIS — E875 Hyperkalemia: Secondary | ICD-10-CM | POA: Diagnosis present

## 2011-11-12 DIAGNOSIS — E0590 Thyrotoxicosis, unspecified without thyrotoxic crisis or storm: Secondary | ICD-10-CM

## 2011-11-12 DIAGNOSIS — E878 Other disorders of electrolyte and fluid balance, not elsewhere classified: Secondary | ICD-10-CM

## 2011-11-12 HISTORY — DX: Hyperlipidemia, unspecified: E78.5

## 2011-11-12 HISTORY — DX: Anxiety disorder, unspecified: F41.9

## 2011-11-12 HISTORY — DX: Mental disorder, not otherwise specified: F99

## 2011-11-12 HISTORY — DX: Anoxic brain damage, not elsewhere classified: G93.1

## 2011-11-12 HISTORY — DX: Pneumonitis due to inhalation of food and vomit: J69.0

## 2011-11-12 HISTORY — DX: Epilepsy, unspecified, not intractable, without status epilepticus: G40.909

## 2011-11-12 HISTORY — DX: Personal history of urinary (tract) infections: Z87.440

## 2011-11-12 LAB — POCT I-STAT, CHEM 8
BUN: 55 mg/dL — ABNORMAL HIGH (ref 6–23)
Chloride: 132 mEq/L (ref 96–112)
Creatinine, Ser: 2.1 mg/dL — ABNORMAL HIGH (ref 0.50–1.35)
Hemoglobin: 18.4 g/dL — ABNORMAL HIGH (ref 13.0–17.0)
Potassium: 4.5 mEq/L (ref 3.5–5.1)
Sodium: 171 mEq/L (ref 135–145)

## 2011-11-12 LAB — URINALYSIS, ROUTINE W REFLEX MICROSCOPIC
Glucose, UA: NEGATIVE mg/dL
Protein, ur: 100 mg/dL — AB
Specific Gravity, Urine: 1.021 (ref 1.005–1.030)
Urobilinogen, UA: 1 mg/dL (ref 0.0–1.0)

## 2011-11-12 LAB — BASIC METABOLIC PANEL
BUN: 52 mg/dL — ABNORMAL HIGH (ref 6–23)
BUN: 47 mg/dL — ABNORMAL HIGH (ref 6–23)
CO2: 20 mEq/L (ref 19–32)
Calcium: 9.8 mg/dL (ref 8.4–10.5)
Chloride: 137 mEq/L (ref 96–112)
Creatinine, Ser: 1.75 mg/dL — ABNORMAL HIGH (ref 0.50–1.35)
Creatinine, Ser: 1.5 mg/dL — ABNORMAL HIGH (ref 0.50–1.35)
GFR calc Af Amer: 64 mL/min — ABNORMAL LOW (ref >90)
GFR calc non Af Amer: 46 mL/min — ABNORMAL LOW (ref >90)
Glucose, Bld: 88 mg/dL (ref 70–99)
Glucose, Bld: 87 mg/dL (ref 70–99)
Potassium: 3.6 mEq/L (ref 3.5–5.1)

## 2011-11-12 LAB — PROTIME-INR
INR: 1.12 (ref 0.00–1.49)
Prothrombin Time: 14.6 seconds (ref 11.6–15.2)

## 2011-11-12 LAB — COMPREHENSIVE METABOLIC PANEL
Albumin: 3.9 g/dL (ref 3.5–5.2)
Alkaline Phosphatase: 85 U/L (ref 39–117)
BUN: 51 mg/dL — ABNORMAL HIGH (ref 6–23)
Creatinine, Ser: 1.69 mg/dL — ABNORMAL HIGH (ref 0.50–1.35)
GFR calc Af Amer: 55 mL/min — ABNORMAL LOW (ref >90)
Glucose, Bld: 90 mg/dL (ref 70–99)
Total Protein: 8.6 g/dL — ABNORMAL HIGH (ref 6.0–8.3)

## 2011-11-12 LAB — POCT I-STAT 3, VENOUS BLOOD GAS (G3P V)
Acid-base deficit: 2 mmol/L (ref 0.0–2.0)
O2 Saturation: 92 %
TCO2: 24 mmol/L (ref 0–100)

## 2011-11-12 LAB — GLUCOSE, CAPILLARY: Glucose-Capillary: 81 mg/dL (ref 70–99)

## 2011-11-12 LAB — CBC
HCT: 55.1 % — ABNORMAL HIGH (ref 39.0–52.0)
Hemoglobin: 17.4 g/dL — ABNORMAL HIGH (ref 13.0–17.0)
MCH: 29.9 pg (ref 26.0–34.0)
MCHC: 31.6 g/dL (ref 30.0–36.0)
MCV: 94.7 fL (ref 78.0–100.0)
RBC: 5.82 MIL/uL — ABNORMAL HIGH (ref 4.22–5.81)

## 2011-11-12 LAB — URINE MICROSCOPIC-ADD ON

## 2011-11-12 LAB — LACTIC ACID, PLASMA: Lactic Acid, Venous: 1.2 mmol/L (ref 0.5–2.2)

## 2011-11-12 LAB — POCT I-STAT TROPONIN I: Troponin i, poc: 0 ng/mL (ref 0.00–0.08)

## 2011-11-12 LAB — MRSA PCR SCREENING: MRSA by PCR: NEGATIVE

## 2011-11-12 LAB — T3, FREE: T3, Free: 4.3 pg/mL — ABNORMAL HIGH (ref 2.3–4.2)

## 2011-11-12 LAB — DIFFERENTIAL
Basophils Relative: 0 % (ref 0–1)
Eosinophils Absolute: 0.1 10*3/uL (ref 0.0–0.7)
Eosinophils Relative: 2 % (ref 0–5)
Lymphs Abs: 3.1 10*3/uL (ref 0.7–4.0)
Monocytes Absolute: 0.9 10*3/uL (ref 0.1–1.0)
Monocytes Relative: 10 % (ref 3–12)

## 2011-11-12 LAB — PROCALCITONIN: Procalcitonin: <0.1 ng/mL

## 2011-11-12 MED ORDER — ASPIRIN 81 MG PO CHEW
324.0000 mg | CHEWABLE_TABLET | ORAL | Status: AC
  Administered 2011-11-12: 324 mg via ORAL
  Filled 2011-11-12: qty 1

## 2011-11-12 MED ORDER — SODIUM CHLORIDE 0.9 % IV BOLUS (SEPSIS)
2000.0000 mL | Freq: Once | INTRAVENOUS | Status: AC
  Administered 2011-11-12: 1000 mL via INTRAVENOUS

## 2011-11-12 MED ORDER — PANTOPRAZOLE SODIUM 40 MG IV SOLR
40.0000 mg | INTRAVENOUS | Status: DC
  Administered 2011-11-12 – 2011-11-13 (×2): 40 mg via INTRAVENOUS
  Filled 2011-11-12 (×4): qty 40

## 2011-11-12 MED ORDER — ACETAMINOPHEN 325 MG PO TABS
650.0000 mg | ORAL_TABLET | Freq: Four times a day (QID) | ORAL | Status: DC | PRN
  Administered 2011-11-15: 650 mg via ORAL
  Filled 2011-11-12: qty 2

## 2011-11-12 MED ORDER — SODIUM CHLORIDE 0.9 % IV SOLN: 250.0000 mL | INTRAVENOUS | Status: DC | PRN

## 2011-11-12 MED ORDER — SODIUM CHLORIDE 0.9 % IV BOLUS (SEPSIS)
1000.0000 mL | Freq: Once | INTRAVENOUS | Status: AC
  Administered 2011-11-12: 1000 mL via INTRAVENOUS

## 2011-11-12 MED ORDER — ACETAMINOPHEN 650 MG RE SUPP
650.0000 mg | Freq: Once | RECTAL | Status: AC
  Administered 2011-11-12: 650 mg via RECTAL
  Filled 2011-11-12: qty 1

## 2011-11-12 MED ORDER — DEXTROSE 5 % IV SOLN
1.0000 g | Freq: Once | INTRAVENOUS | Status: AC
  Administered 2011-11-12: 1 g via INTRAVENOUS
  Filled 2011-11-12 (×2): qty 10

## 2011-11-12 MED ORDER — METOPROLOL TARTRATE 1 MG/ML IV SOLN
5.0000 mg | Freq: Four times a day (QID) | INTRAVENOUS | Status: DC
  Administered 2011-11-13 – 2011-11-16 (×13): 5 mg via INTRAVENOUS
  Filled 2011-11-12 (×21): qty 5

## 2011-11-12 MED ORDER — SODIUM CHLORIDE 0.9 % IV SOLN
500.0000 mg | INTRAVENOUS | Status: AC
  Administered 2011-11-12: 500 mg via INTRAVENOUS
  Filled 2011-11-12: qty 500

## 2011-11-12 MED ORDER — SODIUM CHLORIDE 0.45 % IV SOLN
INTRAVENOUS | Status: DC
  Administered 2011-11-12: 22:00:00 via INTRAVENOUS

## 2011-11-12 MED ORDER — ASPIRIN 300 MG RE SUPP
300.0000 mg | RECTAL | Status: AC
  Filled 2011-11-12: qty 1

## 2011-11-12 MED ORDER — METHIMAZOLE 5 MG PO TABS
5.0000 mg | ORAL_TABLET | Freq: Two times a day (BID) | ORAL | Status: DC
  Filled 2011-11-12 (×2): qty 1

## 2011-11-12 MED ORDER — SODIUM CHLORIDE 0.9 % IV SOLN
500.0000 mg | Freq: Four times a day (QID) | INTRAVENOUS | Status: DC
  Administered 2011-11-12 – 2011-11-16 (×15): 500 mg via INTRAVENOUS
  Filled 2011-11-12 (×19): qty 500

## 2011-11-12 NOTE — ED Provider Notes (Signed)
History     CSN: [id number] Arrival date & time: 11/12/2011 10:20 AM   First MD Initiated Contact with Patient 11/12/11 1029      Chief Complaint  Patient presents with  . Fatigue    (Consider location/radiation/quality/duration/timing/severity/associated sxs/prior treatment) HPI The patient is a 45 year old male with a history of anoxic brain injury who presents today from his nursing facility. Patient is presenting for fatigue. He also has a heart rate into the 140s. Patient is very lethargic in appearance. He will answer my questions and will move all 4 extremities. There is very little report regarding the patient's baseline. He is unable to provide significant history. When asked about pain patient answers that he hurts "all over". He does know his name, that he is in the hospital, and he does follow all my commands with all 4 extremities. There are no other associated or modifying factors.  Past Medical History  Diagnosis Date  . Addison disease   . Anoxic brain injury   . A-fib   . Seizure disorder   . Dysphagia   . History of recurrent UTIs   . Hyperlipemia   . Aspiration pneumonia     History reviewed. No pertinent past surgical history.  History reviewed. No pertinent family history.  History  Substance Use Topics  . Smoking status: Unknown If Ever Smoked  . Smokeless tobacco: Not on file  . Alcohol Use: No      Review of Systems  Unable to perform ROS: Other    Allergies  Latex; Morphine and related; Penicillins; Pyridium; and Soy allergy  Home Medications   Current Outpatient Rx  Name Route Sig Dispense Refill  . CITALOPRAM HYDROBROMIDE 20 MG PO TABS Oral Take 20 mg by mouth daily.      Marland Kitchen DEXTROMETHORPHAN-QUINIDINE 20-10 MG PO CAPS Oral Take 1 tablet by mouth daily.      Marland Kitchen DOCUSATE SODIUM 100 MG PO CAPS Oral Take 100 mg by mouth 2 (two) times daily.      . DULOXETINE HCL 60 MG PO CPEP Oral Take 60 mg by mouth daily.      Marland Kitchen PRO-STAT 64 PO LIQD Oral  Take 30 mLs by mouth 2 (two) times daily.      Marland Kitchen FERROUS SULFATE 325 (65 FE) MG PO TABS Oral Take 325 mg by mouth 3 (three) times daily with meals.      . OMEGA-3 FATTY ACIDS 1000 MG PO CAPS Oral Take 1 g by mouth 2 (two) times daily.      Marland Kitchen METHIMAZOLE 5 MG PO TABS Oral Take 5 mg by mouth daily.      . METHYLCELLULOSE 1 % OP SOLN Both Eyes Place 1 drop into both eyes 4 (four) times daily.      Marland Kitchen METOPROLOL TARTRATE 25 MG PO TABS Oral Take 25 mg by mouth 2 (two) times daily.      . CERTA-VITE PO Oral Take 1 tablet by mouth daily.      Marland Kitchen PANTOPRAZOLE SODIUM 40 MG PO TBEC Oral Take 40 mg by mouth daily. USES PACKET, SO MUST CRUSH TABLETS     . POLYETHYLENE GLYCOL 3350 PO PACK Oral Take 17 g by mouth 2 (two) times daily.      Marland Kitchen HYDROCODONE-ACETAMINOPHEN 5-325 MG PO TABS Oral Take 1 tablet by mouth every 4 (four) hours as needed. FOR PAIN     . IPRATROPIUM-ALBUTEROL 0.5-2.5 (3) MG/3ML IN SOLN Nebulization Take 3 mLs by nebulization every 6 (six) hours as  needed. SHORTNESS OF BREATH     . LORAZEPAM 0.5 MG PO TABS Oral Take 0.5 mg by mouth 3 (three) times daily as needed. FOR ANXIETY     . LORAZEPAM 1 MG PO TABS Oral Take 1 mg by mouth every 6 (six) hours as needed. FOR ANXIETY     . MINERAL OIL RE ENEM Rectal Place 1 enema rectally as needed. FOR CONSTIPATION       BP 129/89  Pulse 117  Temp(Src) 100.7 F (38.2 C) (Rectal)  Resp 17  SpO2 100%  Physical Exam  Nursing note and vitals reviewed. Constitutional: He appears well-developed and well-nourished.       Patient is ill-appearing. He is laying with very little movement.  HENT:  Head: Normocephalic and atraumatic.       Patient has folliculitis white discharge at the mouth  Eyes: Conjunctivae and EOM are normal. Pupils are equal, round, and reactive to light.  Neck: Normal range of motion.  Cardiovascular: Regular rhythm, S1 normal, S2 normal, normal heart sounds, intact distal pulses and normal pulses.  Tachycardia present.  Exam  reveals no gallop and no friction rub.   No murmur heard. Pulmonary/Chest: Breath sounds normal. No respiratory distress. He has no wheezes. He has no rales.  Abdominal: Soft. Bowel sounds are normal. He exhibits no distension. There is no tenderness. There is no rebound and no guarding.  Musculoskeletal: Normal range of motion.  Neurological:       Patient is able to follow commands and move all 4 extremities. He can tell me his name and that he is in a hospital. Patient has no obvious facial asymmetry. His pupils are equal round reactive light accommodation bilaterally.  Skin: Skin is warm. No rash noted.    ED Course  Procedures (including critical care time)  Date: 11/12/2011  Rate: 131  Rhythm: sinus tachycardia  QRS Axis: indeterminate  Intervals: normal  ST/T Wave abnormalities: nonspecific ST/T changes  Conduction Disutrbances:none  Narrative Interpretation:   Old EKG Reviewed: unchanged CRITICAL CARE Performed by: Cyndra Numbers   Total critical care time: 60 min  Critical care time was exclusive of separately billable procedures and treating other patients.  Critical care was necessary to treat or prevent imminent or life-threatening deterioration.  Critical care was time spent personally by me on the following activities: development of treatment plan with patient and/or surrogate as well as nursing, discussions with consultants, evaluation of patient's response to treatment, examination of patient, obtaining history from patient or surrogate, ordering and performing treatments and interventions, ordering and review of laboratory studies, ordering and review of radiographic studies, pulse oximetry and re-evaluation of patient's condition. Labs Reviewed  CBC - Abnormal; Notable for the following:    RBC 5.82 (*)    Hemoglobin 17.4 (*)    HCT 55.1 (*)    RDW 15.7 (*)    Platelets 130 (*)    All other components within normal limits  COMPREHENSIVE METABOLIC PANEL -  Abnormal; Notable for the following:    Sodium 168 (*)    Chloride >130 (*)    BUN 51 (*)    Creatinine, Ser 1.69 (*)    Total Protein 8.6 (*)    Total Bilirubin 1.3 (*)    GFR calc non Af Amer 48 (*)    GFR calc Af Amer 55 (*)    All other components within normal limits  MAGNESIUM - Abnormal; Notable for the following:    Magnesium 2.8 (*)  All other components within normal limits  URINALYSIS, ROUTINE W REFLEX MICROSCOPIC - Abnormal; Notable for the following:    Color, Urine AMBER (*) BIOCHEMICALS MAY BE AFFECTED BY COLOR   Appearance CLOUDY (*)    Hgb urine dipstick LARGE (*)    Protein, ur 100 (*)    Nitrite POSITIVE (*)    Leukocytes, UA LARGE (*)    All other components within normal limits  POCT I-STAT 3, BLOOD GAS (G3P V) - Abnormal; Notable for the following:    pH, Ven 7.400 (*)    pCO2, Ven 36.4 (*)    pO2, Ven 65.0 (*)    All other components within normal limits  GLUCOSE, CAPILLARY - Abnormal; Notable for the following:    Glucose-Capillary 100 (*)    All other components within normal limits  BASIC METABOLIC PANEL - Abnormal; Notable for the following:    Sodium 166 (*)    Potassium 5.5 (*)    Chloride >130 (*)    BUN 52 (*)    Creatinine, Ser 1.75 (*)    GFR calc non Af Amer 46 (*)    GFR calc Af Amer 53 (*)    All other components within normal limits  URINE MICROSCOPIC-ADD ON - Abnormal; Notable for the following:    Squamous Epithelial / LPF FEW (*)    Bacteria, UA MANY (*)    Crystals TRIPLE PHOSPHATE CRYSTALS (*)    All other components within normal limits  POCT I-STAT, CHEM 8 - Abnormal; Notable for the following:    Sodium 171 (*)    Chloride 132 (*)    BUN 55 (*)    Creatinine, Ser 2.10 (*)    Hemoglobin 18.4 (*)    HCT 54.0 (*)    All other components within normal limits  GLUCOSE, CAPILLARY  DIFFERENTIAL  PROTIME-INR  LACTIC ACID, PLASMA  PROCALCITONIN  POCT I-STAT TROPONIN I  CULTURE, BLOOD (ROUTINE X 2)  URINE CULTURE  POCT  CBG MONITORING  I-STAT TROPONIN I  I-STAT, CHEM 8  T3, FREE  TSH   Ct Head Wo Contrast  11/12/2011  *RADIOLOGY REPORT*  Clinical Data: Fatigue  CT HEAD WITHOUT CONTRAST  Technique:  Contiguous axial images were obtained from the base of the skull through the vertex without contrast.  Comparison: Head CT 10/04/2011  Findings: No acute intracranial hemorrhage.  No focal mass lesion. No CT evidence of acute infarction.   No midline shift or mass effect.  No hydrocephalus.  Basilar cisterns are patent. Age advanced atrophy is similar to prior. Paranasal sinuses and mastoid air cells are clear.  Orbits are normal.  IMPRESSION: No acute intracranial findings.  No change from prior.  Original Report Authenticated By: Genevive Bi, M.D.   Dg Chest Portable 1 View  11/12/2011  *RADIOLOGY REPORT*  Clinical Data: Addison's disease.  Tachycardia.  Weakness.  PORTABLE CHEST - 1 VIEW  Comparison: 10/07/2011  Findings: Low lung volumes as seen however both lungs are clear. Heart size is within normal limits.  No evidence of pleural effusion.  IMPRESSION: Low lung volumes.  No active disease.  Original Report Authenticated By: Danae Orleans, M.D.     1. Renal failure   2. Hypernatremia   3. UTI (urinary tract infection)       MDM  The patient was evaluated by myself. Given his appearance upon arrival workup for possible sepsis as well as other causes of altered mental status was performed. There was difficulty obtaining IV access.  There was prolonged period where her nursing was attempting this. When I was notified of the difficulty patient had 2 deep brachial peripheral IVs placed by myself the ultrasound guidance. He tolerated this well. IV fluids were started at this time. Patient already had laboratory workup returning. His last or most significant for no evidence of anemia or leukocytosis but presence of severe hypernatremia to 170. Patient also had acute renal failure with creatinine elevated to 2.  There was uremia with BUN in the 50s as well as hyperchloremia to 1:30. Critical care was consulted by myself. Patient's EKG was remarkable only for sinus tachycardia. There was a period immediately prior to IV access were patient had lower blood pressures. He was given acetaminophen for his fever. No IV antibiotics were given initially as patient had no obvious infectious source. Once urinalysis was performed patient did have evidence of a UTI. Blood and urine cultures were sent. I did give the patient a dose of Rocephin 1 g IV. He still had no leukocytosis. I spoke with the patient's primary care physician, Dr. Leanord Hawking. He stated that the patient did have a history of hyperthyroidism and his medications had been changed following recent hospitalization. The patient was tachycardic at his nursing facility yesterday to 120 and his physician wrote for him to receive metoprolol. Patient also had laboratory work that was performed approximately a week ago that did not have any evidence of electrolyte but did show a low TSH as well as elevated T4 to 191. Patient was previously on methimazole 5 mg by mouth 3 times a day and now is only on this once a day. Given patient's extremely abnormal laboratory results he did have a repeat of this. This once again showed severe hypernatremia. Patient had no changes in his mental status or exam. His tachycardia did improve. His blood pressure improved as well. Patient was accepted for admission to critical care. A chest x-ray did not show any signs of pneumonia. A head CT was ordered once patient was stabilized. Based on calculations patient has a 14 L free water deficit. He will be admitted to the ICU.        Cyndra Numbers, MD 11/12/11 [phone]

## 2011-11-12 NOTE — ED Notes (Signed)
Pt.'s mouth/teeth cleaned thoroughly with denta swabs

## 2011-11-12 NOTE — ED Notes (Signed)
CBG 100 mg/dL °

## 2011-11-12 NOTE — ED Notes (Signed)
Abnormal lab results given to Dr. Alto Denver

## 2011-11-12 NOTE — H&P (Signed)
Patient name: [name] Medical record number: [id number] Date of birth: December 07, 1966 Age: 45 y.o. 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 He lives in a group home setting and the nature of his anoxic brain injury to his left him with very labile psyche including extreme mania at times as well as very depressive/lethargic phases. Patient has recurrent urinary tract infections, history of dysphagia with prior PEG tube which is now out. Patient presented to [facility] emergency room on November 15th from his group home with complaints of worsening altered mental status and tachycardia. The patient was found to be profoundly hypernatremic in the emergency room with a sodium of 171 and PCCM was called to admit the patient to the intensive care.  Lines/tubes  Culture data/sepsis markers Urine 11/15>>> BCx2 11/15>>>  Antibiotics Imipenem (UTI, hx ESBL) 11/15>>  Best practice DVT - SCD's PPI - not indicated  Protocols/consults  Events/studies CT head 11/15>>>   Past Medical History  Diagnosis Date  . Addison disease   . Anoxic brain injury   . A-fib   . Seizure disorder   . Dysphagia   . History of recurrent UTIs   . Hyperlipemia   . Aspiration pneumonia     History reviewed. No pertinent past surgical history.  History reviewed. No pertinent family history.  Social History:  Unknown smoking, ETOH hx.  Lives in group home/SNF.  Current non smoker.   Allergies:  Allergies  Allergen Reactions  . Latex Dermatitis  . Morphine And Related Other (See Comments)    UNKNOWN  . Penicillins Other (See Comments)    UNKNOWN  . Pyridium (Phenazopyridine Hcl) Other (See Comments)    UNKNOWN  . Soy Allergy Other (See Comments)    UNKNOWN    Medications:  Prior to Admission  medications   Medication Sig Start Date End Date Taking? Authorizing Provider  citalopram (CELEXA) 20 MG tablet Take 20 mg by mouth daily.     Yes Historical Provider, MD  Dextromethorphan-Quinidine (NUEDEXTA) 20-10 MG CAPS Take 1 tablet by mouth daily.     Yes Historical Provider, MD  docusate sodium (COLACE) 100 MG capsule Take 100 mg by mouth 2 (two) times daily.     Yes Historical Provider, MD  DULoxetine (CYMBALTA) 60 MG capsule Take 60 mg by mouth daily.     Yes Historical Provider, MD  feeding supplement (PRO-STAT SUGAR FREE 64) LIQD Take 30 mLs by mouth 2 (two) times daily.     Yes Historical Provider, MD  ferrous sulfate 325 (65 FE) MG tablet Take 325 mg by mouth 3 (three) times daily with meals.     Yes Historical Provider, MD  fish oil-omega-3 fatty acids 1000 MG capsule Take 1 g by mouth 2 (two) times daily.     Yes Historical Provider, MD  methimazole (TAPAZOLE) 5 MG tablet Take 5 mg by mouth daily.     Yes Historical Provider, MD  methylcellulose (ARTIFICIAL TEARS) 1 % ophthalmic solution Place 1 drop into both eyes 4 (four) times daily.     Yes Historical Provider, MD  metoprolol tartrate (LOPRESSOR) 25 MG tablet Take 25 mg by mouth 2 (two) times daily.     Yes Historical Provider, MD  Multiple Vitamins-Minerals (CERTA-VITE PO) Take 1 tablet by mouth daily.     Yes Historical  Provider, MD  pantoprazole (PROTONIX) 40 MG tablet Take 40 mg by mouth daily. USES PACKET, SO MUST CRUSH TABLETS    Yes Historical Provider, MD  polyethylene glycol (MIRALAX / GLYCOLAX) packet Take 17 g by mouth 2 (two) times daily.     Yes Historical Provider, MD  HYDROcodone-acetaminophen (NORCO) 5-325 MG per tablet Take 1 tablet by mouth every 4 (four) hours as needed. FOR PAIN     Historical Provider, MD  ipratropium-albuterol (DUONEB) 0.5-2.5 (3) MG/3ML SOLN Take 3 mLs by nebulization every 6 (six) hours as needed. SHORTNESS OF BREATH     Historical Provider, MD  LORazepam (ATIVAN) 0.5 MG tablet Take 0.5 mg  by mouth 3 (three) times daily as needed. FOR ANXIETY     Historical Provider, MD  LORazepam (ATIVAN) 1 MG tablet Take 1 mg by mouth every 6 (six) hours as needed. FOR ANXIETY     Historical Provider, MD  mineral oil enema Place 1 enema rectally as needed. FOR CONSTIPATION     Historical Provider, MD    Review of systems not obtained due to patient factors.  Temp:  [100.3 F (37.9 C)-100.7 F (38.2 C)] 100.7 F (38.2 C) (11/15 1145) Pulse Rate:  [117-145] 117  (11/15 1345) Resp:  [0-27] 18  (11/15 1345) BP: (68-143)/(35-111) 134/111 mmHg (11/15 1345) SpO2:  [95 %-100 %] 100 % (11/15 1345)   No intake or output data in the 24 hours ending 11/12/11 1450  Physical exam General: chronically ill appearing male, NAD Neuro: lethargic, arousable, opens eyes to loud voice, attempts to answer questions, gen weakness CV: s1s2 rrr, tachy PULM: resp even non labored on [place], diminished bases, few scattered rhonchi GI: abd soft, non tender, +bs, mult scars Extremities:  Warm and dry, scant BLE edema   radiology  Dg Chest Portable 1 View  11/12/2011  *RADIOLOGY REPORT*  Clinical Data: Addison's disease.  Tachycardia.  Weakness.  PORTABLE CHEST - 1 VIEW  Comparison: 10/07/2011  Findings: Low lung volumes as seen however both lungs are clear. Heart size is within normal limits.  No evidence of pleural effusion.  IMPRESSION: Low lung volumes.  No active disease.  Original Report Authenticated By: Danae Orleans, M.D.     LAB RESULT Lab Results  Component Value Date   CREATININE 2.10* 11/12/2011   BUN 55* 11/12/2011   NA 171* 11/12/2011   K 4.5 11/12/2011   CL 132* 11/12/2011   CO2 22 11/12/2011   Lab Results  Component Value Date   WBC 9.3 11/12/2011   HGB 18.4* 11/12/2011   HCT 54.0* 11/12/2011   MCV 94.7 11/12/2011   PLT 130* 11/12/2011   Lab Results  Component Value Date   ALT 26 11/12/2011   AST 27 11/12/2011   ALKPHOS 85 11/12/2011   BILITOT 1.3* 11/12/2011   Lab Results    Component Value Date   INR 1.12 11/12/2011   INR 1.18 10/04/2011   INR 1.16 02/17/2011     Assessment and Plan  Active Problems:  Dysphagia  Anoxic brain injury  Hypernatremia  Dehydration  Hyperkalemia  Acute renal failure  Protein calorie malnutrition  Hyperchloremia  Hyperthyroidism  UTI (lower urinary tract infection)  1. Hypernatremia in setting dehydration -- Severe hypernatremia with Na 171 with AMS. Unclear whether he is able/ responsible for self feeding.  See malnutrition.  PLAN Volume - calculated water deficit 13.9L Will start with 1/2 NS at 349ml/hr Monitor resp status with such large volumes  Frequent f/u chem  2. Acute renal failure - unknown baseline Scr.  Recent chem at facility with Scr 1.2 (somehow Na and Cl missing from this lab).  PLAN -  Volume Frequent chem  3. Anoxic brain injury - from cardiac arrest (Was at [facility] for inguinal hernia repair, fell off table, cardiac arrest, anoxic injury).  Per reports has very labile mental status with psych manifestations (severe mania v lethargy) PLAN -  Supportive care Correct Na Monitor resp status - currently protecting airway  4. Hyperkalemia - mild PLAN -  F/u chem  5. Hyperthyroid -- chronic.  ?Methimazole dose decreased after last admit.  PLAN -  TSH (last TSH at facility 11/6 0.063) Increase Methimazole to BID  6. Malnutrition - hx dysphagia.  Previously had PEG, has since been d/c.  Unclear what his eating/drinking capabilities are at baseline.  PLAN -  NPO for now ST eval when more alert If unable to take po's in next day will start TF   7. UTI: history of MDR infection sensitivity only to primaxin. Plan - Primaxin Pan culture  YACOUB,WESAM 11/12/2011, 2:50 PM   *Care during the described time interval was provided by me and/or other providers on the critical care team. I have reviewed this patient's available data, including medical history, events of note, physical  examination and test results as part of my evaluation.

## 2011-11-12 NOTE — Progress Notes (Signed)
ANTIBIOTIC CONSULT NOTE - INITIAL  Pharmacy Consult for primaxin Indication: UTI  Allergies  Allergen Reactions  . Latex Dermatitis  . Morphine And Related Other (See Comments)    UNKNOWN  . Penicillins Other (See Comments)    UNKNOWN  . Pyridium (Phenazopyridine Hcl) Other (See Comments)    UNKNOWN  . Soy Allergy Other (See Comments)    UNKNOWN    Patient Measurements:  Vital Signs: Temp: 100.7 F (38.2 C) (11/15 1145) Temp src: Rectal (11/15 1145) BP: 134/111 mmHg (11/15 1345) Pulse Rate: 117  (11/15 1345) Intake/Output from previous day:   Intake/Output from this shift:    Labs:  Basename 11/12/11 1237 11/12/11 1229 11/12/11 1038  WBC -- -- 9.3  HGB 18.4* -- 17.4*  PLT -- -- 130*  LABCREA -- -- --  CREATININE 2.10* 1.75* 1.69*   CrCl is unknown because there is no height on file for the current visit. No results found for this basename: VANCOTROUGH:2,VANCOPEAK:2,VANCORANDOM:2,GENTTROUGH:2,GENTPEAK:2,GENTRANDOM:2,TOBRATROUGH:2,TOBRAPEAK:2,TOBRARND:2,AMIKACINPEAK:2,AMIKACINTROU:2,AMIKACIN:2, in the last 72 hours   Microbiology: No results found for this or any previous visit (from the past 720 hour(s)).  Medical History: Past Medical History  Diagnosis Date  . Addison disease   . Anoxic brain injury   . A-fib   . Seizure disorder   . Dysphagia   . History of recurrent UTIs   . Hyperlipemia   . 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 Pt has a history of UTI with MDR pathogens. Noted with PCN allergic which has possibility of cross-reactivity with primaxin, however patient has tolerated primaxin in the past. Scr is elevated likely due to dehydration but estimated CrCl is >63ml/min.   Goal of Therapy:  Eradication of infection  Plan:  Follow up culture results Primaxin 500mg  IV Q6H  Evrett Hakim, Drake Leach 11/12/2011,3:08 PM

## 2011-11-12 NOTE — ED Notes (Signed)
Attempted to call report receiving nurse not available at this time request I call her back

## 2011-11-12 NOTE — Progress Notes (Signed)
Name: [name] MRN: [id number] DOB: 10-25-1966  ELECTRONIC ICU PHYSICIAN NOTE  Problem:  Can't tol po   Intervention:  D/c tapazole for now and keep npo for tonight  Sandrea Hughs 11/12/2011, 10:36 PM

## 2011-11-12 NOTE — Progress Notes (Signed)
Pt's nurse mentioned that he had been having a difficult time earlier.  I spent a few minutes with the patient and offered support. Thornell Sartorius 2:56 PM    11/12/11 1400  Clinical Encounter Type  Visited With Patient  Visit Type Initial  Referral From Nurse

## 2011-11-12 NOTE — ED Notes (Signed)
Patient denies pain and is resting comfortably.  

## 2011-11-12 NOTE — H&P (Signed)
Chief Complaint  Patient presents with  . Fatigue    HISTORY of PRESENT ILLNESS:  [name] is a 45 y.o. male admitted on 11/12/2011 with <principal problem not specified>.      Past Medical History  Diagnosis Date  . Addison disease   . Anoxic brain injury   . A-fib   . Seizure disorder   . Dysphagia   . History of recurrent UTIs   . Hyperlipemia   . Aspiration pneumonia     History reviewed. No pertinent past surgical history.  History reviewed. No pertinent family history.   has an unknown smoking status. He does not have any smokeless tobacco history on file. He reports that he does not drink alcohol. His drug history not on file.  Allergies  Allergen Reactions  . Latex Dermatitis  . Morphine And Related Other (See Comments)    UNKNOWN  . Penicillins Other (See Comments)    UNKNOWN  . Pyridium (Phenazopyridine Hcl) Other (See Comments)    UNKNOWN  . Soy Allergy Other (See Comments)    UNKNOWN    Medications Prior to Admission  Medication Dose Route Frequency Provider Last Rate Last Dose  . acetaminophen (TYLENOL) suppository 650 mg  650 mg Rectal Once Cyndra Numbers, MD   650 mg at 11/12/11 1045  . cefTRIAXone (ROCEPHIN) 1 g in dextrose 5 % 50 mL IVPB  1 g Intravenous Once Cyndra Numbers, MD   1 g at 11/12/11 1343  . imipenem-cilastatin (PRIMAXIN) 500 mg in sodium chloride 0.9 % 100 mL IVPB  500 mg Intravenous To Major Drake Leach Rumbarger, PHARMD      . sodium chloride 0.9 % bolus 1,000 mL  1,000 mL Intravenous Once Cyndra Numbers, MD   1,000 mL at 11/12/11 1326  . sodium chloride 0.9 % bolus 1,000 mL  1,000 mL Intravenous Once Cyndra Numbers, MD   1,000 mL at 11/12/11 1422  . sodium chloride 0.9 % bolus 2,000 mL  2,000 mL Intravenous Once Cyndra Numbers, MD   1,000 mL at 11/12/11 1247   No current outpatient prescriptions on file as of 11/12/2011.    ROS:  PHYICAL EXAM:  Blood pressure 134/111, pulse 117, temperature 100.7 F (38.2 C), temperature source  Rectal, resp. rate 18, SpO2 100.00%.  General: Neuro: CV:  PULM: GI: Extremities:   LABS CBC    Component Value Date/Time   WBC 9.3 11/12/2011 1038   RBC 5.82* 11/12/2011 1038   HGB 18.4* 11/12/2011 1237   HCT 54.0* 11/12/2011 1237   PLT 130* 11/12/2011 1038   MCV 94.7 11/12/2011 1038   MCH 29.9 11/12/2011 1038   MCHC 31.6 11/12/2011 1038   RDW 15.7* 11/12/2011 1038   LYMPHSABS 3.1 11/12/2011 1038   MONOABS 0.9 11/12/2011 1038   EOSABS 0.1 11/12/2011 1038   BASOSABS 0.0 11/12/2011 1038    BMET    Component Value Date/Time   NA 171* 11/12/2011 1237   K 4.5 11/12/2011 1237   CL 132* 11/12/2011 1237   CO2 22 11/12/2011 1229   GLUCOSE 96 11/12/2011 1237   BUN 55* 11/12/2011 1237   CREATININE 2.10* 11/12/2011 1237   CALCIUM 9.8 11/12/2011 1229   CALCIUM 8.7 02/19/2011 1515   GFRNONAA 46* 11/12/2011 1229   GFRAA 53* 11/12/2011 1229      ABG    Component Value Date/Time   PHART 7.466* 01/25/2011 1850   HCO3 22.6 11/12/2011 1116   TCO2 23 11/12/2011 1237   ACIDBASEDEF 2.0 11/12/2011 1116  O2SAT 92.0 11/12/2011 1116    RADIOLOGIC DATA   Chief Complaint  Patient presents with  . Fatigue    HISTORY of PRESENT ILLNESS:  [name] is a 45 y.o. male admitted on 11/12/2011 with <principal problem not specified>.   RADIOLOGIC DATA  Results for orders placed during the hospital encounter of 11/12/11 (from the past 48 hour(s))  GLUCOSE, CAPILLARY     Status: Normal   Collection Time   11/12/11 10:29 AM      Component Value Range Comment   Glucose-Capillary 94  70 - 99 (mg/dL)    Comment 1 Notify RN      Comment 2 Documented in Chart     CBC     Status: Abnormal   Collection Time   11/12/11 10:38 AM      Component Value Range Comment   WBC 9.3  4.0 - 10.5 (K/uL)    RBC 5.82 (*) 4.22 - 5.81 (MIL/uL)    Hemoglobin 17.4 (*) 13.0 - 17.0 (g/dL)    HCT 16.1 (*) 09.6 - 52.0 (%)    MCV 94.7  78.0 - 100.0 (fL)    MCH 29.9  26.0 - 34.0 (pg)    MCHC 31.6   30.0 - 36.0 (g/dL)    RDW 04.5 (*) 40.9 - 15.5 (%)    Platelets 130 (*) 150 - 400 (K/uL)   DIFFERENTIAL     Status: Normal   Collection Time   11/12/11 10:38 AM      Component Value Range Comment   Neutrophils Relative 52  43 - 77 (%)    Neutro Abs 4.5  1.7 - 7.7 (K/uL)    Lymphocytes Relative 36  12 - 46 (%)    Lymphs Abs 3.1  0.7 - 4.0 (K/uL)    Monocytes Relative 10  3 - 12 (%)    Monocytes Absolute 0.9  0.1 - 1.0 (K/uL)    Eosinophils Relative 2  0 - 5 (%)    Eosinophils Absolute 0.1  0.0 - 0.7 (K/uL)    Basophils Relative 0  0 - 1 (%)    Basophils Absolute 0.0  0.0 - 0.1 (K/uL)   COMPREHENSIVE METABOLIC PANEL     Status: Abnormal   Collection Time   11/12/11 10:38 AM      Component Value Range Comment   Sodium 168 (*) 135 - 145 (mEq/L)    Potassium 4.3  3.5 - 5.1 (mEq/L)    Chloride >130 (*) 96 - 112 (mEq/L)    CO2 21  19 - 32 (mEq/L)    Glucose, Bld 90  70 - 99 (mg/dL)    BUN 51 (*) 6 - 23 (mg/dL)    Creatinine, Ser 8.11 (*) 0.50 - 1.35 (mg/dL)    Calcium 9.7  8.4 - 10.5 (mg/dL)    Total Protein 8.6 (*) 6.0 - 8.3 (g/dL)    Albumin 3.9  3.5 - 5.2 (g/dL)    AST 27  0 - 37 (U/L)    ALT 26  0 - 53 (U/L)    Alkaline Phosphatase 85  39 - 117 (U/L)    Total Bilirubin 1.3 (*) 0.3 - 1.2 (mg/dL)    GFR calc non Af Amer 48 (*) >90 (mL/min)    GFR calc Af Amer 55 (*) >90 (mL/min)   PROTIME-INR     Status: Normal   Collection Time   11/12/11 10:38 AM      Component Value Range Comment   Prothrombin Time 14.6  11.6 - 15.2 (seconds)    INR 1.12  0.00 - 1.49    LACTIC ACID, PLASMA     Status: Normal   Collection Time   11/12/11 10:38 AM      Component Value Range Comment   Lactic Acid, Venous 1.2  0.5 - 2.2 (mmol/L)   MAGNESIUM     Status: Abnormal   Collection Time   11/12/11 10:38 AM      Component Value Range Comment   Magnesium 2.8 (*) 1.5 - 2.5 (mg/dL)   PROCALCITONIN     Status: Normal   Collection Time   11/12/11 10:58 AM      Component Value Range Comment    Procalcitonin <0.10     POCT I-STAT TROPONIN I     Status: Normal   Collection Time   11/12/11 11:11 AM      Component Value Range Comment   Troponin i, poc 0.00  0.00 - 0.08 (ng/mL)    Comment 3            POCT I-STAT 3, BLOOD GAS (G3P V)     Status: Abnormal   Collection Time   11/12/11 11:16 AM      Component Value Range Comment   pH, Ven 7.400 (*) 7.250 - 7.300     pCO2, Ven 36.4 (*) 45.0 - 50.0 (mmHg)    pO2, Ven 65.0 (*) 30.0 - 45.0 (mmHg)    Bicarbonate 22.6  20.0 - 24.0 (mEq/L)    TCO2 24  0 - 100 (mmol/L)    O2 Saturation 92.0      Acid-base deficit 2.0  0.0 - 2.0 (mmol/L)    Sample type VENOUS     GLUCOSE, CAPILLARY     Status: Abnormal   Collection Time   11/12/11 11:25 AM      Component Value Range Comment   Glucose-Capillary 100 (*) 70 - 99 (mg/dL)   URINALYSIS, ROUTINE W REFLEX MICROSCOPIC     Status: Abnormal   Collection Time   11/12/11 12:04 PM      Component Value Range Comment   Color, Urine AMBER (*) YELLOW  BIOCHEMICALS MAY BE AFFECTED BY COLOR   Appearance CLOUDY (*) CLEAR     Specific Gravity, Urine 1.021  1.005 - 1.030     pH 7.5  5.0 - 8.0     Glucose, UA NEGATIVE  NEGATIVE (mg/dL)    Hgb urine dipstick LARGE (*) NEGATIVE     Bilirubin Urine NEGATIVE  NEGATIVE     Ketones, ur NEGATIVE  NEGATIVE (mg/dL)    Protein, ur 161 (*) NEGATIVE (mg/dL)    Urobilinogen, UA 1.0  0.0 - 1.0 (mg/dL)    Nitrite POSITIVE (*) NEGATIVE     Leukocytes, UA LARGE (*) NEGATIVE    URINE MICROSCOPIC-ADD ON     Status: Abnormal   Collection Time   11/12/11 12:04 PM      Component Value Range Comment   Squamous Epithelial / LPF FEW (*) RARE     WBC, UA 21-50  <3 (WBC/hpf)    RBC / HPF 11-20  <3 (RBC/hpf)    Bacteria, UA MANY (*) RARE     Crystals TRIPLE PHOSPHATE CRYSTALS (*) NEGATIVE    BASIC METABOLIC PANEL     Status: Abnormal   Collection Time   11/12/11 12:29 PM      Component Value Range Comment   Sodium 166 (*) 135 - 145 (mEq/L)    Potassium 5.5 (*) 3.5 - 5.1  (  mEq/L)    Chloride >130 (*) 96 - 112 (mEq/L)    CO2 22  19 - 32 (mEq/L)    Glucose, Bld 88  70 - 99 (mg/dL)    BUN 52 (*) 6 - 23 (mg/dL)    Creatinine, Ser 9.14 (*) 0.50 - 1.35 (mg/dL)    Calcium 9.8  8.4 - 10.5 (mg/dL)    GFR calc non Af Amer 46 (*) >90 (mL/min)    GFR calc Af Amer 53 (*) >90 (mL/min)   POCT I-STAT, CHEM 8     Status: Abnormal   Collection Time   11/12/11 12:37 PM      Component Value Range Comment   Sodium 171 (*) 135 - 145 (mEq/L)    Potassium 4.5  3.5 - 5.1 (mEq/L)    Chloride 132 (*) 96 - 112 (mEq/L)    BUN 55 (*) 6 - 23 (mg/dL)    Creatinine, Ser 7.82 (*) 0.50 - 1.35 (mg/dL)    Glucose, Bld 96  70 - 99 (mg/dL)    Calcium, Ion 9.56  1.12 - 1.32 (mmol/L)    TCO2 23  0 - 100 (mmol/L)    Hemoglobin 18.4 (*) 13.0 - 17.0 (g/dL)    HCT 21.3 (*) 08.6 - 52.0 (%)    Comment NOTIFIED PHYSICIAN         ASSESSMENT/PLAN:      Canary Brim, NP-C Pgr: [phone]  11/12/2011

## 2011-11-13 DIAGNOSIS — I519 Heart disease, unspecified: Secondary | ICD-10-CM

## 2011-11-13 LAB — BASIC METABOLIC PANEL
BUN: 38 mg/dL — ABNORMAL HIGH (ref 6–23)
CO2: 22 mEq/L (ref 19–32)
CO2: 18 mEq/L — ABNORMAL LOW (ref 19–32)
Calcium: 9.5 mg/dL (ref 8.4–10.5)
Calcium: 9.8 mg/dL (ref 8.4–10.5)
Chloride: >130 mEq/L (ref 96–112)
Creatinine, Ser: 1.34 mg/dL (ref 0.50–1.35)
Creatinine, Ser: 1.17 mg/dL (ref 0.50–1.35)
Creatinine, Ser: 1.23 mg/dL (ref 0.50–1.35)
GFR calc Af Amer: 86 mL/min — ABNORMAL LOW (ref >90)
GFR calc non Af Amer: 74 mL/min — ABNORMAL LOW (ref >90)
GFR calc non Af Amer: 70 mL/min — ABNORMAL LOW (ref >90)
Glucose, Bld: 91 mg/dL (ref 70–99)
Sodium: 165 mEq/L (ref 135–145)
Sodium: 153 mEq/L — ABNORMAL HIGH (ref 135–145)

## 2011-11-13 LAB — CBC
Hemoglobin: 15.7 g/dL (ref 13.0–17.0)
MCH: 29.6 pg (ref 26.0–34.0)
MCHC: 30.7 g/dL (ref 30.0–36.0)
Platelets: 87 10*3/uL — ABNORMAL LOW (ref 150–400)
RDW: 15.4 % (ref 11.5–15.5)

## 2011-11-13 LAB — MAGNESIUM
Magnesium: 2.5 mg/dL (ref 1.5–2.5)
Magnesium: 2.7 mg/dL — ABNORMAL HIGH (ref 1.5–2.5)

## 2011-11-13 LAB — GLUCOSE, CAPILLARY: Glucose-Capillary: 86 mg/dL (ref 70–99)

## 2011-11-13 LAB — PHOSPHORUS
Phosphorus: 4.5 mg/dL (ref 2.3–4.6)
Phosphorus: 4.5 mg/dL (ref 2.3–4.6)

## 2011-11-13 LAB — TSH: TSH: 0.026 u[IU]/mL — ABNORMAL LOW (ref 0.350–4.500)

## 2011-11-13 LAB — T4, FREE: Free T4: 1.31 ng/dL (ref 0.80–1.80)

## 2011-11-13 MED ORDER — PNEUMOCOCCAL VAC POLYVALENT 25 MCG/0.5ML IJ INJ
0.5000 mL | INJECTION | INTRAMUSCULAR | Status: AC
  Administered 2011-11-14: 0.5 mL via INTRAMUSCULAR
  Filled 2011-11-13: qty 0.5

## 2011-11-13 MED ORDER — VITAL 1.5 CAL PO LIQD
1000.0000 mL | ORAL | Status: DC
  Filled 2011-11-13 (×4): qty 1000

## 2011-11-13 MED ORDER — PRO-STAT SUGAR FREE PO LIQD
30.0000 mL | Freq: Two times a day (BID) | ORAL | Status: DC
  Administered 2011-11-15 – 2011-11-16 (×3): 30 mL
  Filled 2011-11-13 (×7): qty 30

## 2011-11-13 MED ORDER — CHLORHEXIDINE GLUCONATE 0.12 % MT SOLN
15.0000 mL | Freq: Two times a day (BID) | OROMUCOSAL | Status: DC
  Administered 2011-11-13 – 2011-11-19 (×12): 15 mL via OROMUCOSAL
  Filled 2011-11-13 (×16): qty 15

## 2011-11-13 MED ORDER — FREE WATER
200.0000 mL | Freq: Every day | Status: DC
  Filled 2011-11-13 (×17): qty 200

## 2011-11-13 MED ORDER — DEXTROSE 5 % IV SOLN
INTRAVENOUS | Status: DC
  Administered 2011-11-13 – 2011-11-15 (×8): via INTRAVENOUS

## 2011-11-13 MED ORDER — INFLUENZA VIRUS VACC SPLIT PF IM SUSP
0.5000 mL | INTRAMUSCULAR | Status: AC
  Administered 2011-11-14: 0.5 mL via INTRAMUSCULAR
  Filled 2011-11-13: qty 0.5

## 2011-11-13 MED ORDER — SODIUM CHLORIDE 0.9 % IJ SOLN
INTRAMUSCULAR | Status: AC
  Administered 2011-11-13: 20 mL via INTRAVENOUS
  Filled 2011-11-13: qty 20

## 2011-11-13 MED ORDER — FLEXIFLO TOPTAINER FEED SET MISC
Status: AC
  Filled 2011-11-13: qty 1

## 2011-11-13 MED ORDER — BIOTENE DRY MOUTH MT LIQD
15.0000 mL | Freq: Two times a day (BID) | OROMUCOSAL | Status: DC
  Administered 2011-11-13 – 2011-11-20 (×11): 15 mL via OROMUCOSAL

## 2011-11-13 NOTE — Progress Notes (Signed)
  Echocardiogram 2D Echocardiogram has been performed.  Rande Roylance Lynn Maycol Hoying, RDCS 11/13/2011, 11:05 AM

## 2011-11-13 NOTE — H&P (Signed)
Pt staffed by Dr Molli Knock

## 2011-11-13 NOTE — Progress Notes (Signed)
INITIAL ADULT NUTRITION ASSESSMENT Date: 11/13/2011   Time: 10:36 AM  Reason for Assessment: MD consult   ASSESSMENT: Male 45 y.o.  Dx: Hypernatremia, Dehydration  Hx:  Past Medical History  Diagnosis Date  . Addison disease   . Anoxic brain injury   . A-fib   . Seizure disorder   . Dysphagia   . History of recurrent UTIs   . Hyperlipemia   . Aspiration pneumonia   . Mental disorder   . Anxiety     Related Meds:     . acetaminophen  650 mg Rectal Once  . antiseptic oral rinse  15 mL Mouth Rinse q12n4p  . aspirin  324 mg Oral NOW   Or  . aspirin  300 mg Rectal NOW  . cefTRIAXone (ROCEPHIN) IV  1 g Intravenous Once  . chlorhexidine  15 mL Mouth Rinse BID  . imipenem-cilastatin  500 mg Intravenous To Major  . imipenem-cilastatin  500 mg Intravenous Q6H  . influenza  inactive virus vaccine  0.5 mL Intramuscular Tomorrow-1000  . metoprolol  5 mg Intravenous Q6H  . pantoprazole (PROTONIX) IV  40 mg Intravenous Q24H  . pneumococcal 23 valent vaccine  0.5 mL Intramuscular Tomorrow-1000  . sodium chloride  1,000 mL Intravenous Once  . sodium chloride  1,000 mL Intravenous Once  . sodium chloride  2,000 mL Intravenous Once  . sodium chloride      . DISCONTD: methimazole  5 mg Oral BID    Ht: 5\' 9"  (175.3 cm)  Wt: 216 lb 14.9 oz (98.4 kg)  Ideal Wt: 72.7 kg % Ideal Wt: 135%  Usual Wt: unknown  Body mass index is 32.04 kg/(m^2).  Food/Nutrition Related Hx: pureed diet with nectar thick liquids per discharge summary 10/04/2011; patient has had a PEG for TF and free water in the past (earlier this year), according to chart review  Labs:  BMET    Component Value Date/Time   NA 165* 11/13/2011 0630   K 3.9 11/13/2011 0630   CL >130* 11/13/2011 0630   CO2 22 11/13/2011 0630   GLUCOSE 91 11/13/2011 0630   BUN 43* 11/13/2011 0630   CREATININE 1.34 11/13/2011 0630   CALCIUM 9.3 11/13/2011 0630   CALCIUM 8.7 02/19/2011 1515   GFRNONAA 63* 11/13/2011 0630   GFRAA  73* 11/13/2011 0630    I/O last 3 completed shifts: In: 2160 [I.V.:1950; IV Piggyback:210] Out: 250 [Urine:250]    Diet Order: NPO  IVF:    dextrose Last Rate: 175 mL/hr at 11/13/11 0300  DISCONTD: sodium chloride Last Rate: 300 mL/hr at 11/12/11 2213    Estimated Nutritional Needs:   Kcal: 2000 Protein: 110-130 grams Fluid: 2.4-2.6 liters  NUTRITION DIAGNOSIS: 1-Inadequate oral intake (NI-2.1).  Status: Ongoing  RELATED TO: lethargy, altered mental status  AS EVIDENCE BY: NPO status  Nutrition Dx #2:  Inadequate fluid intake related to limited access to fluid as evidenced by 14 liter free water deficit on admission.  MONITORING/EVALUATION(Goals): Goal:  Tolerate diet advancement with intake to meet nutrition needs. Monitor for ability to advance po diet as mental status improves.  EDUCATION NEEDS: -Education not appropriate at this time  INTERVENTION: If patient is receiving thickened liquids at nursing facility, he may not be receiving adequate free water intake.  Altered mental status may also limit his intake. Recommend ST consult to determine most appropriate diet for patient.  If patient requires thickened liquids and is unable to maintain adequate fluid intake, may need PEG for free water  flushes.  Will follow for ability to advance diet and monitor adequacy of oral intake.  Dietitian [phone]  DOCUMENTATION CODES Per approved criteria  -Obesity Unspecified    Hettie Holstein 11/13/2011, 10:36 AM

## 2011-11-13 NOTE — Significant Event (Signed)
CRITICAL VALUE ALERT  Critical value received:  Na 165  Date of notification:  11/13/11  Time of notification:  0755  Critical value read back:yes  Nurse who received alert:  Philbert Riser, RN  MD notified (1st page):  [phone] Dr. Luan Pulling  Time of first page:  Dr. Luan Pulling on floor  MD notified (2nd page): N/A  Time of second page:n/A  Responding MD:  Dr. Luan Pulling   Time MD responded:  [phone]

## 2011-11-13 NOTE — Progress Notes (Signed)
eLink Physician-Brief Progress Note Patient Name: [name] DOB: 1966/06/19 MRN: [id number]  Date of Service  11/13/2011   HPI/Events of Note   Ongoing hypernatremia  eICU Interventions  Patient admitted with AMS, hypernatremia and suspected UTI.  Was on 1/2 NS at 300 cc/hr based on calculated deficit.   Currently HD stable with Na of 168.  Glucose is low to normal.  There is some question as to the patient's ability to take in free water on own.  Apparently being fed at care facility. Plan: Change IVFs to D5W at 175 cc/hr Check BMETs q4 hours Nutrition consult for nutritional intake/ablity to have access to food/free water      DETERDING,ELIZABETH 11/13/2011, 1:03 AM

## 2011-11-13 NOTE — Progress Notes (Signed)
Nutrition Follow-up / Consult / Brief note  Received consult to initiate TF.  Patient with continued lethargy, unable to take any po's.  Panda being placed today.  Soy allergy noted.  Will initiate Vital 1.5 (does not contain soy) @ 20 ml/h, increase by 10 ml every 4 hours to goal of 50 ml/h with prostat 30 ml BID to provide 1944 kcals, 111 grams protein, 917 ml free water daily.  Will also need 200 ml free water flushes every 4 hours to meet baseline fluid needs.  RD to monitor TF tolerance/adequacy, labs, weight trend.

## 2011-11-13 NOTE — Progress Notes (Addendum)
Patient name: [name] Medical record number: [id number]  Date of birth: 02/05/1966 Age: 45 y.o. 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 He lives in a group home setting and the nature of his anoxic brain injury to his left him with very labile psyche including extreme mania at times as well as very depressive/lethargic phases. Patient has recurrent urinary tract infections, history of dysphagia with prior PEG tube which is now out. Patient presented to [facility] emergency room on November 15th from his group home with complaints of worsening altered mental status and tachycardia. The patient was found to be profoundly hypernatremic in the emergency room with a sodium of 171 and PCCM was called to admit the patient to the intensive care.  Lines/tubes  Culture data/sepsis markers  MRSA PCR 11/15: Positive Urine 11/15>>>  BCx2 11/15>>>   Antibiotics  Imipenem (UTI, hx ESBL) 11/15>>   Best practice  DVT - SCD's  PPI - not indicated  Protocols/consults   Events/studies  CT head 11/15: negative  Subjective Still minimally responsive   Intake/Output Summary (Last 24 hours) at 11/13/11 1300 Last data filed at 11/13/11 1100  Gross per 24 hour  Intake   2873 ml  Output    250 ml  Net   2623 ml     Objective  Temp:  [98.2 F (36.8 C)-100.7 F (38.2 C)] 98.2 F (36.8 C) (11/16 0800) Pulse Rate:  [45-145] 99  (11/16 0700) Resp:  [11-27] 21  (11/16 0700) BP: (68-143)/(35-111) 108/75 mmHg (11/16 0700) SpO2:  [97 %-100 %] 100 % (11/16 0700) Weight:  [97.2 kg (214 lb 4.6 oz)-98.4 kg (216 lb 14.9 oz)] 216 lb 14.9 oz (98.4 kg) (11/16 0500)  Physical exam: General: chronically ill appearing obese male, minimally responsive. HENT: no JVD, no adenopathy Pulm: clear, decreased  in bases, Card: RRR EXT: + anasarca abd: soft, NT, + bowel sounds. GU: clear yellow via foley cath. Neuro: eyes open, no follow commands. Generalized weakness.  PCXR: 11/15: Clear w/out infiltrate.   Lab 11/13/11 0630 11/12/11 2118 11/12/11 1237 11/12/11 1229  NA 165* 168* 171* --  K 3.9 3.6 4.5 --  CL >130* 137* 132* --  CO2 22 20 -- 22  BUN 43* 47* 55* --  CREATININE 1.34 1.50* 2.10* --  GLUCOSE 91 87 96 --  ]  Lab 11/13/11 0630 11/12/11 1237 11/12/11 1038  HGB 15.7 18.4* 17.4*  HCT 51.1 54.0* 55.1*  WBC 7.9 -- 9.3  PLT 87* -- 130*  }  Impression/plan: 1. Hypernatremia in setting dehydration -- Severe hypernatremia with Na 171 with AMS. Unclear whether he is able/ responsible for self feeding. Slowly improving w/ re-hydration efforts.   Lab 11/13/11 0630 11/12/11 2118 11/12/11 1237  NA 165* 168* 171*  ] PLAN  -cont with D5W -Monitor resp status with such large volumes  -Frequent f/u chem   2. Acute renal failure - unknown baseline Scr. Recent chem at facility with Scr 1.2 (somehow Na and Cl missing from this lab). Improving w/ hydration efforts.  Recent Labs  Basename 11/13/11 0630 11/12/11 2118 11/12/11 1237   CREATININE 1.34 1.50* 2.10*  ] PLAN -  -cont IVFs, follow up chemistry  3. Anoxic brain injury - from cardiac arrest (Was at [facility] for inguinal hernia repair, fell off table,  cardiac arrest, anoxic injury). Per reports has very labile mental status with psych manifestations (severe mania v lethargy)  PLAN -  -Supportive care  -Correct Na   4. Hyperkalemia - Resolved  5. Hyperthyroid -- chronic. ? Methimazole dose decreased after last admit.  PLAN -  TSH (last TSH at facility 11/6 0.063)  Lab Results  Component Value Date   TSH 0.026* 11/12/2011  plan: -Increased Methimazole to BID   6. Malnutrition - hx dysphagia. Previously had PEG, has since been d/c. Unclear what his eating/drinking capabilities are at baseline.  PLAN -  -Place Panda  tube.  -nutritional consult  7.Prob UTI, history of MDR infection sensitivity only to primaxin.  Plan -  - continue primaxin -follow up cultures.   8) Run of VT Plan: -cont tele monitoring   Will keep as ICU pt for 24 more hours given his multiple metabolic issues.   Patient seen and examined, agree with above note.  I dictated the care and orders written for this patient under my direction.  Koren Bound, M.D.

## 2011-11-13 NOTE — Progress Notes (Signed)
PHARMACY - CRITICAL CARE PROGRESS NOTE  Pharmacy Consult for Primaxin Indication: UTI   Allergies  Allergen Reactions  . Latex Dermatitis  . Morphine And Related Other (See Comments)    UNKNOWN  . Penicillins Other (See Comments)    UNKNOWN  . Pyridium (Phenazopyridine Hcl) Other (See Comments)    UNKNOWN  . Soy Allergy Other (See Comments)    UNKNOWN    Patient Measurements: Height: 5\' 9"  (175.3 cm) Weight: 216 lb 14.9 oz (98.4 kg) IBW/kg (Calculated) : 70.7    Vital Signs: Temp: 98.3 F (36.8 C) (11/16 1240) Temp src: Oral (11/16 1240) BP: 118/97 mmHg (11/16 1000) Pulse Rate: 102  (11/16 1000) Intake/Output from previous day: 11/15 0701 - 11/16 0700 In: 2160 [I.V.:1950; IV Piggyback:210] Out: 250 [Urine:250] Intake/Output from this shift: Total I/O In: 713 [I.V.:613; IV Piggyback:100] Out: -   Labs:  Basename 11/13/11 1130 11/13/11 0630 11/12/11 2118 11/12/11 1237 11/12/11 1038  WBC -- 7.9 -- -- 9.3  HGB -- 15.7 -- 18.4* 17.4*  HCT -- 51.1 -- 54.0* 55.1*  PLT -- 87* -- -- 130*  APTT -- -- -- -- --  INR -- -- -- -- 1.12  CREATININE -- 1.34 1.50* 2.10* --  LABCREA -- -- -- -- --  CREATININE -- 1.34 1.50* 2.10* --  LABCREA -- -- -- -- --  CREAT24HRUR -- -- -- -- --  MG 2.7* 2.5 2.5 -- --  PHOS 4.5 4.5 2.8 -- --  ALBUMIN -- -- -- -- 3.9  PROT -- -- -- -- 8.6*  AST -- -- -- -- 27  ALT -- -- -- -- 26  ALKPHOS -- -- -- -- 85  BILITOT -- -- -- -- 1.3*  BILIDIR -- -- -- -- --  IBILI -- -- -- -- --   Estimated Creatinine Clearance: 81.4 ml/min (by C-G formula based on Cr of 1.34).   Basename 11/13/11 0038 11/12/11 2008 11/12/11 1125  GLUCAP 86 81 100*    Microbiology: Recent Results (from the past 720 hour(s))  CULTURE, BLOOD (ROUTINE X 2)     Status: Normal (Preliminary result)   Collection Time   11/12/11 11:00 AM      Component Value Range Status Comment   Specimen Description BLOOD RIGHT HAND   Final    Special Requests BOTTLES DRAWN AEROBIC  ONLY 6CC   Final    Setup Time [id number]   Final    Culture     Final    Value:        BLOOD CULTURE RECEIVED NO GROWTH TO DATE CULTURE WILL BE HELD FOR 5 DAYS BEFORE ISSUING A FINAL NEGATIVE REPORT   Report Status PENDING   Incomplete   MRSA PCR SCREENING     Status: Normal   Collection Time   11/12/11  8:11 PM      Component Value Range Status Comment   MRSA by PCR NEGATIVE  NEGATIVE  Final     Medications:  Prescriptions prior to admission  Medication Sig Dispense Refill  . citalopram (CELEXA) 20 MG tablet Take 20 mg by mouth daily.        Marland Kitchen Dextromethorphan-Quinidine (NUEDEXTA) 20-10 MG CAPS Take 1 tablet by mouth daily.        Marland Kitchen docusate sodium (COLACE) 100 MG capsule Take 100 mg by mouth 2 (two) times daily.        . DULoxetine (CYMBALTA) 60 MG capsule Take 60 mg by mouth daily.        Marland Kitchen  feeding supplement (PRO-STAT SUGAR FREE 64) LIQD Take 30 mLs by mouth 2 (two) times daily.        . ferrous sulfate 325 (65 FE) MG tablet Take 325 mg by mouth 3 (three) times daily with meals.        . fish oil-omega-3 fatty acids 1000 MG capsule Take 1 g by mouth 2 (two) times daily.        . methimazole (TAPAZOLE) 5 MG tablet Take 5 mg by mouth daily.        . methylcellulose (ARTIFICIAL TEARS) 1 % ophthalmic solution Place 1 drop into both eyes 4 (four) times daily.        . metoprolol tartrate (LOPRESSOR) 25 MG tablet Take 25 mg by mouth 2 (two) times daily.        . Multiple Vitamins-Minerals (CERTA-VITE PO) Take 1 tablet by mouth daily.        . pantoprazole (PROTONIX) 40 MG tablet Take 40 mg by mouth daily. USES PACKET, SO MUST CRUSH TABLETS       . polyethylene glycol (MIRALAX / GLYCOLAX) packet Take 17 g by mouth 2 (two) times daily.        Marland Kitchen HYDROcodone-acetaminophen (NORCO) 5-325 MG per tablet Take 1 tablet by mouth every 4 (four) hours as needed. FOR PAIN       . ipratropium-albuterol (DUONEB) 0.5-2.5 (3) MG/3ML SOLN Take 3 mLs by nebulization every 6 (six) hours as needed. SHORTNESS  OF BREATH       . LORazepam (ATIVAN) 0.5 MG tablet Take 0.5 mg by mouth 3 (three) times daily as needed. FOR ANXIETY       . LORazepam (ATIVAN) 1 MG tablet Take 1 mg by mouth every 6 (six) hours as needed. FOR ANXIETY       . mineral oil enema Place 1 enema rectally as needed. FOR CONSTIPATION        Scheduled:    . antiseptic oral rinse  15 mL Mouth Rinse q12n4p  . aspirin  324 mg Oral NOW   Or  . aspirin  300 mg Rectal NOW  . cefTRIAXone (ROCEPHIN) IV  1 g Intravenous Once  . chlorhexidine  15 mL Mouth Rinse BID  . Flexiflo Toptainer Feed Set      . imipenem-cilastatin  500 mg Intravenous To Major  . imipenem-cilastatin  500 mg Intravenous Q6H  . influenza  inactive virus vaccine  0.5 mL Intramuscular Tomorrow-1000  . metoprolol  5 mg Intravenous Q6H  . pantoprazole (PROTONIX) IV  40 mg Intravenous Q24H  . pneumococcal 23 valent vaccine  0.5 mL Intramuscular Tomorrow-1000  . sodium chloride  1,000 mL Intravenous Once  . sodium chloride      . DISCONTD: methimazole  5 mg Oral BID   Infusions:    . dextrose 175 mL/hr at 11/13/11 0300  . DISCONTD: sodium chloride 300 mL/hr at 11/12/11 2213    Patient Active Problem List  Diagnoses  . Dysphagia  . Anoxic brain injury  . Hypernatremia  . Dehydration  . Hyperkalemia  . Acute renal failure  . Protein calorie malnutrition  . Hyperchloremia  . Hyperthyroidism  . 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 Pt has a history of UTI with MDR pathogens. Noted with PCN allergic which has possibility of cross-reactivity with primaxin, however patient has tolerated primaxin in  the past. Scr is elevated likely due to dehydration but estimated CrCl is >57ml/min.   Pharmacy Problem List:  Anticoagulant/DVT Prophylaxis: SCDs ID: Day 2 primaxin for hx MDR UTI. Scr improving, likely dehydrated on admit.  Crcl >80. Urine/Blood cx pending. Afebrile. WBC 7.9 Neuro: Hx of manic/depressive episodes. PTA citalopram, cymbalta, and dextromethophan-quinidine (usually indicated for pseudobulbar affect)  Pulm: 100% 2L [place] Card: tachycardia on admit, iv metoprolol. PO lopressor PTA GI: dysphagia noted panda tube to be placed  Endo: Noted pt has documented history of addisons disease but plasma cortisol WNL. No meds PTA. Hyperthyroid: On tapazole pta (currently held) but TSH low. Renal/Lytes: Dehydration improving: Scr 1.5, NA 168 Heme/Onc: Plts down from 130 11/15 to 87 today  Best Practices: SCD, IV ptx     Plan:  1. Continue primaxin 500mg  IV q6h and f/u cultures 2. Monitor decline in platelets. Note: tapazole can be associated with agranulocytosis.  3. Recommend starting patients home meds when able: Cymbalta, citalopram, dextromethophan-quinidine, and tapazole.   Thank you,   Juliann Pulse 11/13/2011,2:09 PM

## 2011-11-14 ENCOUNTER — Inpatient Hospital Stay (HOSPITAL_COMMUNITY): Payer: Medicare Other

## 2011-11-14 LAB — URINE CULTURE: Colony Count: >=100000

## 2011-11-14 IMAGING — CR DG ABDOMEN ACUTE W/ 1V CHEST
1 series · 1 of 1 positions shown · non-contrast
Comparison: CT 10/05/2011, abdominal radiograph 04/12/2011

CLINICAL DATA: Abdominal pain

ACUTE ABDOMEN SERIES (ABDOMEN 2 VIEW & CHEST 1 VIEW)

[view not recorded]
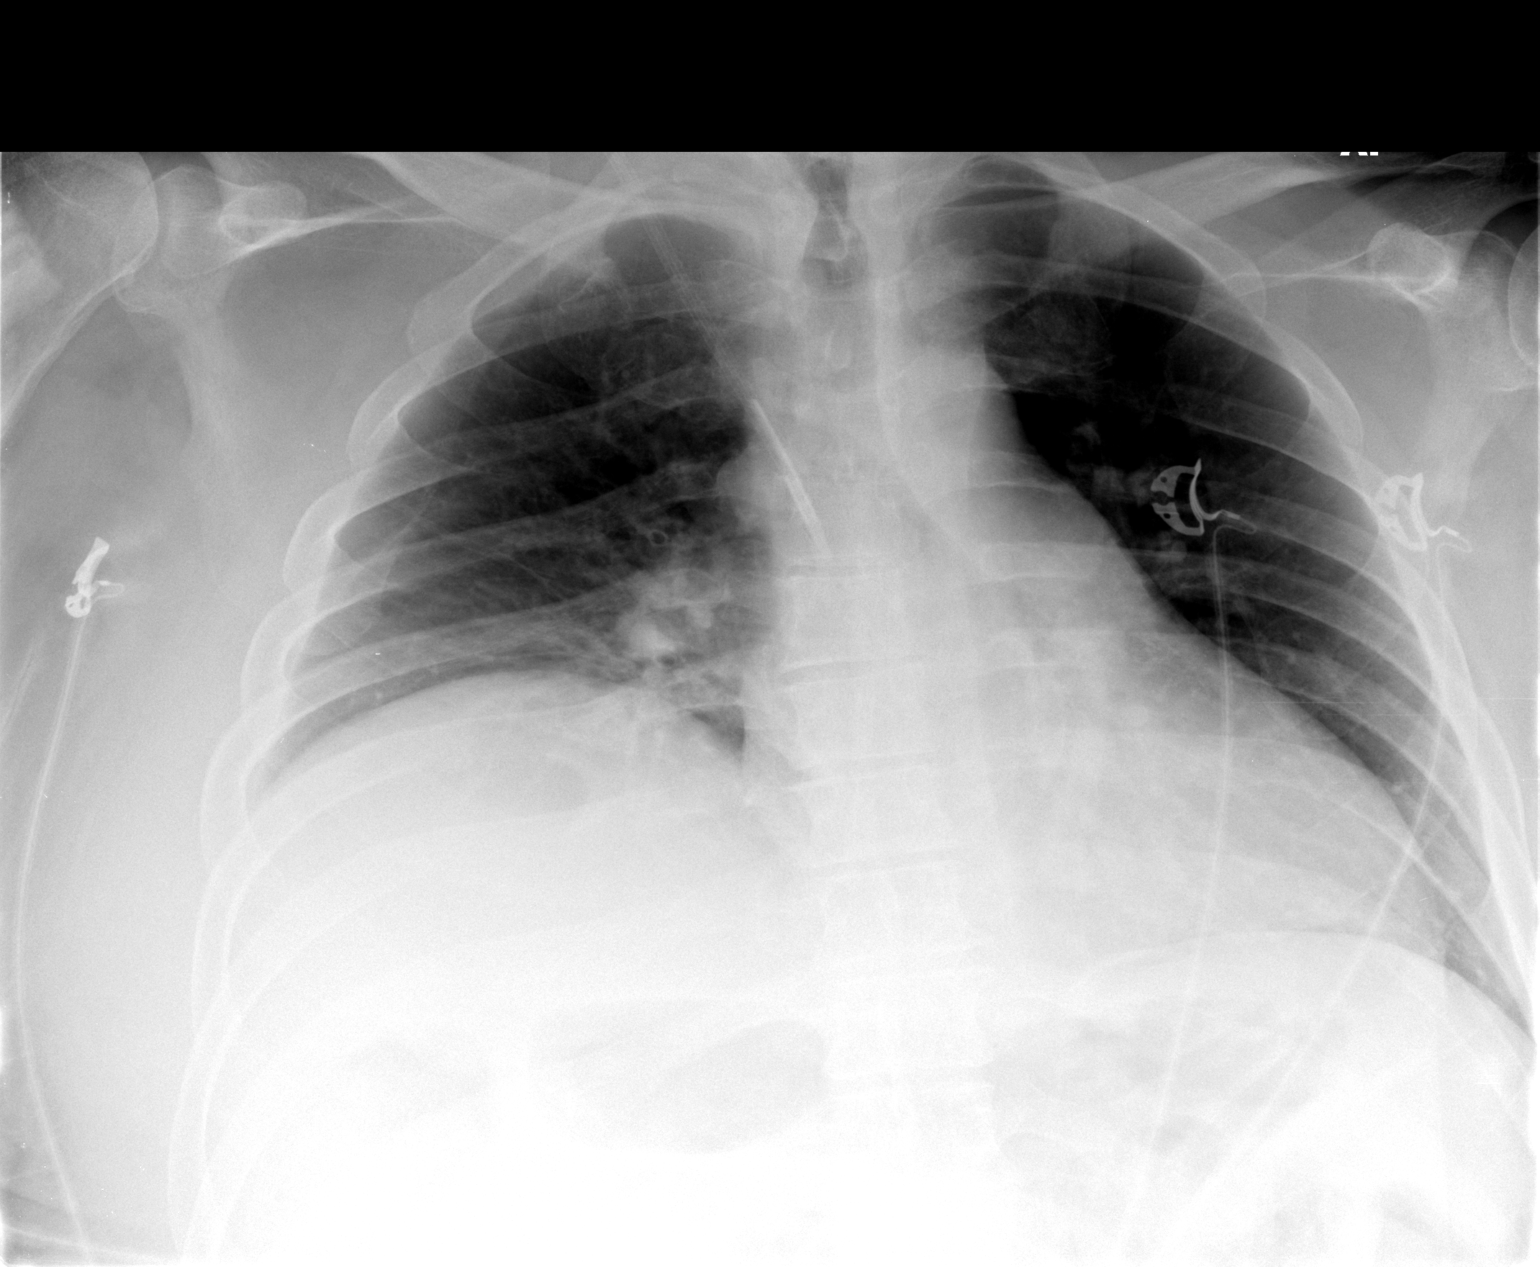

[1 of 1 positions shown; findings below may reference images not displayed]

FINDINGS: Right-sided IJ line terminates over the distal SVC.
Cardiac leads overlie the chest.  Heart size is at upper limits of
normal.  Lung volumes are low but clear.  No pleural effusion.  No
acute osseous finding.  No free air beneath the diaphragms.

Epigastric clips are noted.  Moderate stool volume noted in
prominently rectosigmoid colon.  Relatively prominent stool burden
over the rectum could indicate a fecal impaction.  Temperature
probe projects over the pelvis.  No air fluid level or dilated loop
of bowel.  No abnormal calcific opacity.
IMPRESSION: Low volume exam without focal acute cardiopulmonary process.

Nonobstructive bowel gas pattern.  Prominence of stool over the
rectosigmoid colon, particularly the rectum, may indicate fecal
impaction.

## 2011-11-14 MED ORDER — PANTOPRAZOLE SODIUM 40 MG IV SOLR
40.0000 mg | INTRAVENOUS | Status: DC
  Administered 2011-11-14 – 2011-11-15 (×2): 40 mg via INTRAVENOUS
  Filled 2011-11-14 (×2): qty 40

## 2011-11-14 MED ORDER — PANTOPRAZOLE SODIUM 40 MG PO PACK
40.0000 mg | PACK | Freq: Every day | ORAL | Status: DC
  Filled 2011-11-14: qty 20

## 2011-11-14 NOTE — Procedures (Signed)
L arm DL PICC to SVC/RA junction No complications

## 2011-11-14 NOTE — Progress Notes (Signed)
PHARMACY - CRITICAL CARE PROGRESS NOTE  Pharmacy Consult for Primaxin Indication: UTI   Allergies  Allergen Reactions  . Latex Dermatitis  . Morphine And Related Other (See Comments)    UNKNOWN  . Penicillins Other (See Comments)    UNKNOWN  . Pyridium (Phenazopyridine Hcl) Other (See Comments)    UNKNOWN  . Soy Allergy Other (See Comments)    UNKNOWN    Patient Measurements: Height: 5\' 9"  (175.3 cm) Weight: 223 lb 1.7 oz (101.2 kg) IBW/kg (Calculated) : 70.7    Vital Signs: Temp: 98.8 F (37.1 C) (11/17 0700) Temp src: Oral (11/17 0700) BP: 102/76 mmHg (11/17 0900) Pulse Rate: 95  (11/17 0900) Intake/Output from previous day: 11/16 0701 - 11/17 0700 In: 4163 [I.V.:3763; IV Piggyback:400] Out: 650 [Urine:650] Intake/Output from this shift: Total I/O In: 350 [I.V.:350] Out: 100 [Urine:100]  Labs:  Basename 11/13/11 2151 11/13/11 1621 11/13/11 1130 11/13/11 0630 11/12/11 2118 11/12/11 1237 11/12/11 1038  WBC -- -- -- 7.9 -- -- 9.3  HGB -- -- -- 15.7 -- 18.4* 17.4*  HCT -- -- -- 51.1 -- 54.0* 55.1*  PLT -- -- -- 87* -- -- 130*  APTT -- -- -- -- -- -- --  INR -- -- -- -- -- -- 1.12  CREATININE 1.23 1.17 -- 1.34 -- -- --  LABCREA -- -- -- -- -- -- --  CREATININE 1.23 1.17 -- 1.34 -- -- --  LABCREA -- -- -- -- -- -- --  CREAT24HRUR -- -- -- -- -- -- --  MG -- -- 2.7* 2.5 2.5 -- --  PHOS -- -- 4.5 4.5 2.8 -- --  ALBUMIN -- -- -- -- -- -- 3.9  PROT -- -- -- -- -- -- 8.6*  AST -- -- -- -- -- -- 27  ALT -- -- -- -- -- -- 26  ALKPHOS -- -- -- -- -- -- 85  BILITOT -- -- -- -- -- -- 1.3*  BILIDIR -- -- -- -- -- -- --  IBILI -- -- -- -- -- -- --   Estimated Creatinine Clearance: 89.9 ml/min (by C-G formula based on Cr of 1.23).   Basename 11/13/11 0038 11/12/11 2008 11/12/11 1125  GLUCAP 86 81 100*    Microbiology: Recent Results (from the past 720 hour(s))  CULTURE, BLOOD (ROUTINE X 2)     Status: Normal (Preliminary result)   Collection Time   11/12/11  11:00 AM      Component Value Range Status Comment   Specimen Description BLOOD RIGHT HAND   Final    Special Requests BOTTLES DRAWN AEROBIC ONLY 6CC   Final    Setup Time [id number]   Final    Culture     Final    Value:        BLOOD CULTURE RECEIVED NO GROWTH TO DATE CULTURE WILL BE HELD FOR 5 DAYS BEFORE ISSUING A FINAL NEGATIVE REPORT   Report Status PENDING   Incomplete   URINE CULTURE     Status: Normal   Collection Time   11/12/11 12:04 PM      Component Value Range Status Comment   Specimen Description URINE, CATHETERIZED   Final    Special Requests NONE   Final    Setup Time [id number]   Final    Colony Count >=100,000 COLONIES/ML   Final    Culture PROTEUS MIRABILIS   Final    Report Status 11/14/2011 FINAL   Final    Organism ID, Bacteria PROTEUS MIRABILIS  Final   URINE CULTURE     Status: Normal (Preliminary result)   Collection Time   11/12/11 12:04 PM      Component Value Range Status Comment   Specimen Description URINE, CATHETERIZED   Final    Special Requests ADDED ON [id number] @2105    Final    Setup Time [id number]   Final    Colony Count >=100,000 COLONIES/ML   Final    Culture PROTEUS MIRABILIS   Final    Report Status PENDING   Incomplete   MRSA PCR SCREENING     Status: Normal   Collection Time   11/12/11  8:11 PM      Component Value Range Status Comment   MRSA by PCR NEGATIVE  NEGATIVE  Final   CULTURE, BLOOD (ROUTINE X 2)     Status: Normal (Preliminary result)   Collection Time   11/12/11  8:49 PM      Component Value Range Status Comment   Specimen Description BLOOD LEFT HAND   Final    Special Requests BOTTLES DRAWN AEROBIC AND ANAEROBIC 10CC EACH   Final    Setup Time [id number]   Final    Culture     Final    Value:        BLOOD CULTURE RECEIVED NO GROWTH TO DATE CULTURE WILL BE HELD FOR 5 DAYS BEFORE ISSUING A FINAL NEGATIVE REPORT   Report Status PENDING   Incomplete   CULTURE, BLOOD (ROUTINE X 2)     Status: Normal (Preliminary  result)   Collection Time   11/12/11  8:58 PM      Component Value Range Status Comment   Specimen Description BLOOD LEFT THUMB   Final    Special Requests BOTTLES DRAWN AEROBIC AND ANAEROBIC 10CC EACH   Final    Setup Time [id number]   Final    Culture     Final    Value:        BLOOD CULTURE RECEIVED NO GROWTH TO DATE CULTURE WILL BE HELD FOR 5 DAYS BEFORE ISSUING A FINAL NEGATIVE REPORT   Report Status PENDING   Incomplete     Medications:  Prescriptions prior to admission  Medication Sig Dispense Refill  . citalopram (CELEXA) 20 MG tablet Take 20 mg by mouth daily.        Marland Kitchen Dextromethorphan-Quinidine (NUEDEXTA) 20-10 MG CAPS Take 1 tablet by mouth daily.        Marland Kitchen docusate sodium (COLACE) 100 MG capsule Take 100 mg by mouth 2 (two) times daily.        . DULoxetine (CYMBALTA) 60 MG capsule Take 60 mg by mouth daily.        . feeding supplement (PRO-STAT SUGAR FREE 64) LIQD Take 30 mLs by mouth 2 (two) times daily.        . ferrous sulfate 325 (65 FE) MG tablet Take 325 mg by mouth 3 (three) times daily with meals.        . fish oil-omega-3 fatty acids 1000 MG capsule Take 1 g by mouth 2 (two) times daily.        . methimazole (TAPAZOLE) 5 MG tablet Take 5 mg by mouth daily.        . methylcellulose (ARTIFICIAL TEARS) 1 % ophthalmic solution Place 1 drop into both eyes 4 (four) times daily.        . metoprolol tartrate (LOPRESSOR) 25 MG tablet Take 25 mg by mouth 2 (two) times daily.        Marland Kitchen  Multiple Vitamins-Minerals (CERTA-VITE PO) Take 1 tablet by mouth daily.        . pantoprazole (PROTONIX) 40 MG tablet Take 40 mg by mouth daily. USES PACKET, SO MUST CRUSH TABLETS       . polyethylene glycol (MIRALAX / GLYCOLAX) packet Take 17 g by mouth 2 (two) times daily.        Marland Kitchen HYDROcodone-acetaminophen (NORCO) 5-325 MG per tablet Take 1 tablet by mouth every 4 (four) hours as needed. FOR PAIN       . ipratropium-albuterol (DUONEB) 0.5-2.5 (3) MG/3ML SOLN Take 3 mLs by nebulization every 6  (six) hours as needed. SHORTNESS OF BREATH       . LORazepam (ATIVAN) 0.5 MG tablet Take 0.5 mg by mouth 3 (three) times daily as needed. FOR ANXIETY       . LORazepam (ATIVAN) 1 MG tablet Take 1 mg by mouth every 6 (six) hours as needed. FOR ANXIETY       . mineral oil enema Place 1 enema rectally as needed. FOR CONSTIPATION        Scheduled:     . antiseptic oral rinse  15 mL Mouth Rinse q12n4p  . chlorhexidine  15 mL Mouth Rinse BID  . feeding supplement  30 mL Per Tube BID  . Flexiflo Toptainer Feed Set      . free water  200 mL Per Tube 6 X Daily  . imipenem-cilastatin  500 mg Intravenous Q6H  . influenza  inactive virus vaccine  0.5 mL Intramuscular Tomorrow-1000  . metoprolol  5 mg Intravenous Q6H  . pantoprazole (PROTONIX) IV  40 mg Intravenous Q24H  . pneumococcal 23 valent vaccine  0.5 mL Intramuscular Tomorrow-1000   Infusions:     . dextrose 175 mL/hr at 11/14/11 0349  . feeding supplement (VITAL 1.5 CAL)      Patient Active Problem List  Diagnoses  . Dysphagia  . Anoxic brain injury  . Hypernatremia  . Dehydration  . Hyperkalemia  . Acute renal failure  . Protein calorie malnutrition  . Hyperchloremia  . Hyperthyroidism  . 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 Pt has a history of UTI with MDR pathogens. Noted with PCN allergic which has possibility of cross-reactivity with primaxin, however patient has tolerated primaxin in the past. Scr is elevated likely due to dehydration.  Pharmacy Problem List/Assessment: Anticoagulation: None PTA Infectious disease:Day 3 primaxin for hx MDR UTI. Now with Proteus in urine, sens to Rocephin. Scr incr, UOP low. Hydration cont (free water and D5W d/t high Na). Blood cx ngtd. Afebrile. WBC WNL. Neuro: Hx of manic/depressive episodes. PTA citalopram, cymbalta, and  dextromethophan-quinidine (usually indicated for pseudobulbar affect)  Pulm: 100% 2L  [place] Card: Tachycardia persists, IV lopressor cont, BP controlled. FEN/GI: TF with Vital 1.5 (d/t Soy allergy) at 50cc/hr ordered. D5 and free water for hypernatremia, which is slowly improving. Endo: Noted pt has documented history of addisons disease but plasma cortisol WNL. No meds PTA. Hyperthyroid: On tapazole pta (currently held) -TSH on admit low, T3 mildly elevated. Renal: Scr elevated, UOP low.  Heme/Onc: Plts low. CBC otherwise OK. OBGYN: n/a VTE px, best practices: SCDs, IV PPI  Plan:  1. Continue primaxin 500mg  IV q6h, will ask CCM to consider changing abx to Rocephin, and establish stop date of 11/21 (complete 7 d course) if appropriate. 2.  Monitor decline in platelets. Note: tapazole (currently held) can be associated with agranulocytosis.  3. Recommend starting patients home meds when able: Cymbalta, citalopram, dextromethophan-quinidine. Consider recheck thyroid function tests in ~1wk and consider tapazole resumption if appropriate. 4. Will change PPI to per tube.  Baltazar Pekala K. Allena Katz, PharmD, BCPS.  Clinical Pharmacist Pager [phone]. 11/14/2011 12:56 PM

## 2011-11-14 NOTE — Progress Notes (Signed)
Assess both arms for PICC placement.  Unable to see any veins Rt. Arm.  Rt arm swollen.  Unable to find Lt basilic or cephalic veins.  Unable to see Lt. Brachial away from artery.  Unsuitable for bed side PICC

## 2011-11-14 NOTE — Progress Notes (Signed)
Brief history  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 He lives in a group home setting and the nature of his anoxic brain injury to his left him with very labile psyche including extreme mania at times as well as very depressive/lethargic phases. Patient has recurrent urinary tract infections, history of dysphagia with prior PEG tube which is now out. Patient presented to [facility] emergency room on November 15th from his group home with complaints of worsening altered mental status and tachycardia. The patient was found to be profoundly hypernatremic in the emergency room with a sodium of 171 and PCCM was called to admit the patient to the intensive care.    Lines/tubes  Culture data/sepsis markers  MRSA PCR 11/15: Positive Urine 11/15>>> greater 100k proteus>>> BCx2 11/15>>>   Antibiotics  Imipenem (UTI, hx ESBL) 11/15>>   Best practice  DVT - SCD's  PPI - not indicated  Protocols/consults   Events/studies  CT head 11/15: negative  Subjective Remains lethargic, squeezed hand to command   Intake/Output Summary (Last 24 hours) at 11/14/11 1100 Last data filed at 11/14/11 0900  Gross per 24 hour  Intake   3450 ml  Output    750 ml  Net   2700 ml     Objective  Temp:  [97.8 F (36.6 C)-99.5 F (37.5 C)] 98.8 F (37.1 C) (11/17 0700) Pulse Rate:  [91-117] 95  (11/17 0900) Resp:  [13-22] 13  (11/17 0900) BP: (93-127)/(59-98) 102/76 mmHg (11/17 0900) SpO2:  [92 %-100 %] 100 % (11/17 0900) Weight:  [223 lb 1.7 oz (101.2 kg)] 223 lb 1.7 oz (101.2 kg) (11/17 0500)  Physical exam: General: chronically ill appearing obese male, minimally responsive.  HENT: no JVD, no adenopathy Pulm: clear, decreased in bases,  Card: RRR  GI: + anasarca abd: soft, NT, + bowel sounds.  GU: clear yellow via foley cath. Neuro: eyes open, no follow commands. Generalized weakness.  EXT: warm, dry.  RUE with  erythema/warmth  Radiology 11/17  -no new films   Lab 11/13/11 2151 11/13/11 1621 11/13/11 0630  NA 153* 158* 165*  K 4.6 4.7 3.9  CL 122* 128* >130*  CO2 18* 22 22  BUN 35* 38* 43*  CREATININE 1.23 1.17 1.34  GLUCOSE 117* 113* 91  ]  Lab 11/13/11 0630 11/12/11 1237 11/12/11 1038  HGB 15.7 18.4* 17.4*  HCT 51.1 54.0* 55.1*  WBC 7.9 -- 9.3  PLT 87* -- 130*  }  Impression/plan: 1. Hypernatremia in setting dehydration -- Severe hypernatremia with Na 171 with AMS. Unclear whether he is able/ responsible for self feeding. Slowly improving w/ re-hydration efforts.   Lab 11/13/11 2151 11/13/11 1621 11/13/11 0630  NA 153* 158* 165*   PLAN  -cont with D5W -Monitor resp status with such large volumes  -Frequent f/u chem   2. Acute renal failure - unknown baseline Scr. Recent chem at facility with Scr 1.2 (somehow Na and Cl missing from this lab). Improving w/ hydration efforts.  Recent Labs  Basename 11/13/11 2151 11/13/11 1621 11/13/11 0630   CREATININE 1.23 1.17 1.34  ] PLAN -  -cont IVFs, follow up chemistry  3. Anoxic brain injury - from cardiac arrest (Was at [facility] for inguinal hernia repair, fell off table, cardiac arrest, anoxic injury). Per reports has very labile mental status with psych manifestations (severe mania v lethargy)  PLAN -  -Supportive care  -Correct Na   4. Hyperkalemia -  Resolved  5. Hyperthyroid -- chronic. ? Methimazole dose decreased after last admit.  PLAN -  TSH (last TSH at facility 11/6 0.063)  Lab Results  Component Value Date   TSH 0.026* 11/12/2011  plan: -Increased Methimazole to BID  -f/u TSH in 4-6 weeks 12/21  6. Malnutrition - hx dysphagia. Previously had PEG, has since been d/c. Unclear what his eating/drinking capabilities are at baseline.  PLAN -  -attempted to place PANDA 5x 11/16 with success but patient would cough/tongue out -will continue NPO status until he is more alert/back to baseline -nutritional  consult  7. UTI, history of MDR infection sensitivity only to primaxin. UC this admit positve for proteus , sensitivities pending 11/17.  Plan -  -continue primaxin -follow up cultures.   8) Run of VT Plan: -cont tele monitoring   9) Limited IV access PLAN: -pending PICC placement per IR.  IV team reviewed and is on their "do not attempt" list.  Also, reviewed by IV team RN at bedside and unable to find site for access 11/17.    10) Thrombocytopenia-no evidence of bleeding PLAN: -monitor  Tx to SDU and to [facility] Team 8  Steven Brim, NP-C  Pulmonary & Critical Care Pgr: [phone]  PCCM signing off, please call back if needed.  Patient seen and examined, agree with above note.  I dictated the care and orders written for this patient under my direction.  [name], M.D.

## 2011-11-15 LAB — CBC
HCT: 35.2 % — ABNORMAL LOW (ref 39.0–52.0)
Platelets: 53 10*3/uL — ABNORMAL LOW (ref 150–400)
RBC: 3.88 MIL/uL — ABNORMAL LOW (ref 4.22–5.81)
RDW: 14.3 % (ref 11.5–15.5)
WBC: 4 10*3/uL (ref 4.0–10.5)

## 2011-11-15 LAB — BASIC METABOLIC PANEL
Calcium: 8.2 mg/dL — ABNORMAL LOW (ref 8.4–10.5)
Chloride: 116 mEq/L — ABNORMAL HIGH (ref 96–112)
Creatinine, Ser: 0.83 mg/dL (ref 0.50–1.35)
GFR calc Af Amer: >90 mL/min (ref >90)
Sodium: 145 mEq/L (ref 135–145)

## 2011-11-15 LAB — GLUCOSE, CAPILLARY: Glucose-Capillary: 89 mg/dL (ref 70–99)

## 2011-11-15 MED ORDER — SODIUM CHLORIDE 0.9 % IJ SOLN
INTRAMUSCULAR | Status: AC
  Administered 2011-11-15: 10 mL
  Filled 2011-11-15: qty 10

## 2011-11-15 MED ORDER — POTASSIUM CHLORIDE CRYS ER 20 MEQ PO TBCR
20.0000 meq | EXTENDED_RELEASE_TABLET | Freq: Two times a day (BID) | ORAL | Status: DC
  Administered 2011-11-15 – 2011-11-16 (×3): 20 meq via ORAL
  Filled 2011-11-15 (×4): qty 1

## 2011-11-15 MED ORDER — DEXTROSE 5 % IV SOLN
INTRAVENOUS | Status: DC
  Administered 2011-11-15: 10:00:00 via INTRAVENOUS

## 2011-11-15 MED ORDER — POTASSIUM CHLORIDE 10 MEQ/50ML IV SOLN
10.0000 meq | INTRAVENOUS | Status: AC
  Administered 2011-11-15 (×2): 10 meq via INTRAVENOUS
  Filled 2011-11-15 (×2): qty 50

## 2011-11-15 NOTE — Progress Notes (Signed)
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 He lives in a group home setting and the nature of his anoxic brain injury has left him with very labile psyche including extreme mania at times as well as very depressive/lethargic phases. Patient has recurrent urinary tract infections, history of dysphagia with prior PEG tube which is now out. Patient presented to [facility] emergency room on November 15th from his group home with complaints of worsening altered mental status and tachycardia. The patient was found to be profoundly hypernatremic in the emergency room with a sodium of 171 and was admitted to [facility]. [facility] is assuming care of the patient as of today.  [name] appears to be resting comfortably at the present.  He is alert.  He will answer basic questions with a nod of the head.  He does not speak to the examiner.  He is moving all 4 ext spontaneously.  He does not appear to be in pain or respiratory distress.     Objective: Weight change: 1.5 kg (3 lb 4.9 oz)  Intake/Output Summary (Last 24 hours) at 11/15/11 0813 Last data filed at 11/15/11 0800  Gross per 24 hour  Intake   3823 ml  Output   1325 ml  Net   2498 ml   Blood pressure 123/75, pulse 65, temperature 98.1 F (36.7 C), temperature source Oral, resp. rate 15, height 5\' 9"  (1.753 m), weight 102.7 kg (226 lb 6.6 oz), SpO2 100.00%. Temp:  [97.9 F (36.6 C)-99.1 F (37.3 C)] 98.1 F (36.7 C) (11/18 0757) Pulse Rate:  [62-95] 65  (11/18 0800) Resp:  [8-23] 15  (11/18 0800) BP: (96-137)/(33-76) 123/75 mmHg (11/18 0800) SpO2:  [95 %-100 %] 100 % (11/18 0800) Weight:  [102.7 kg (226 lb 6.6 oz)] 226 lb 6.6 oz (102.7 kg) (11/18 0500)  Physical Exam: General: No acute respiratory distress Lungs: Clear to auscultation bilaterally without wheezes or crackles Cardiovascular: Regular rate and rhythm without murmur gallop or rub normal S1  and S2 Abdomen: Nontender, nondistended, soft, bowel sounds positive, no rebound, no ascites, no appreciable mass Extremities: No significant cyanosis, clubbing, or edema bilateral lower extremities GU: Condom cath in place  Lab Results:  [facility] 11/15/11 0454 11/13/11 2151 11/13/11 1621 11/13/11 1130 11/13/11 0630 11/12/11 2118  NA 145 153* 158* -- -- --  K 3.3* 4.6 4.7 -- -- --  CL 116* 122* 128* -- -- --  CO2 23 18* 22 -- -- --  GLUCOSE 83 117* 113* -- -- --  BUN 17 35* 38* -- -- --  CREATININE 0.83 1.23 1.17 -- -- --  CALCIUM 8.2* 9.8 9.5 -- -- --  MG -- -- -- 2.7* 2.5 2.5  PHOS -- -- -- 4.5 4.5 2.8    Basename 11/12/11 1038  AST 27  ALT 26  ALKPHOS 85  BILITOT 1.3*  PROT 8.6*  ALBUMIN 3.9   No results found for this basename: LIPASE:2,AMYLASE:2 in the last 72 hours  Basename 11/15/11 0454 11/13/11 0630 11/12/11 1237 11/12/11 1038  WBC 4.0 7.9 -- 9.3  NEUTROABS -- -- -- 4.5  HGB 11.1* 15.7 18.4* --  HCT 35.2* 51.1 54.0* --  MCV 90.7 96.4 -- 94.7  PLT 53* 87* -- 130*   No results found for this basename: CKTOTAL:3,CKMB:3,CKMBINDEX:3,TROPONINI:3 in the last 72 hours No results found for this basename: POCBNP:3 in the last 72 hours No results found for  this basename: DDIMER:2 in the last 72 hours No results found for this basename: HGBA1C:12 in the last 72 hours No results found for this basename: CHOL:2,HDL:2,LDLCALC:2,TRIG:2,CHOLHDL:2,LDLDIRECT:2 in the last 72 hours  Basename 11/12/11 2118 11/12/11 1258  TSH 0.026* --  T4TOTAL -- --  T3FREE -- 4.3*  THYROIDAB -- --   No results found for this basename: VITAMINB12:2,FOLATE:2,FERRITIN:2,TIBC:2,IRON:2,RETICCTPCT:2 in the last 72 hours  Micro Results: Recent Results (from the past 240 hour(s))  CULTURE, BLOOD (ROUTINE X 2)     Status: Normal (Preliminary result)   Collection Time   11/12/11 11:00 AM      Component Value Range Status Comment   Specimen Description BLOOD RIGHT HAND   Final    Special Requests  BOTTLES DRAWN AEROBIC ONLY 6CC   Final    Setup Time [id number]   Final    Culture     Final    Value:        BLOOD CULTURE RECEIVED NO GROWTH TO DATE CULTURE WILL BE HELD FOR 5 DAYS BEFORE ISSUING A FINAL NEGATIVE REPORT   Report Status PENDING   Incomplete   URINE CULTURE     Status: Normal   Collection Time   11/12/11 12:04 PM      Component Value Range Status Comment   Specimen Description URINE, CATHETERIZED   Final    Special Requests NONE   Final    Setup Time [id number]   Final    Colony Count >=100,000 COLONIES/ML   Final    Culture PROTEUS MIRABILIS   Final    Report Status 11/14/2011 FINAL   Final    Organism ID, Bacteria PROTEUS MIRABILIS   Final   URINE CULTURE     Status: Normal   Collection Time   11/12/11 12:04 PM      Component Value Range Status Comment   Specimen Description URINE, CATHETERIZED   Final    Special Requests ADDED ON 213086 @2105    Final    Setup Time [id number]   Final    Colony Count >=100,000 COLONIES/ML   Final    Culture PROTEUS MIRABILIS   Final    Report Status 11/14/2011 FINAL   Final    Organism ID, Bacteria PROTEUS MIRABILIS   Final   MRSA PCR SCREENING     Status: Normal   Collection Time   11/12/11  8:11 PM      Component Value Range Status Comment   MRSA by PCR NEGATIVE  NEGATIVE  Final   CULTURE, BLOOD (ROUTINE X 2)     Status: Normal (Preliminary result)   Collection Time   11/12/11  8:49 PM      Component Value Range Status Comment   Specimen Description BLOOD LEFT HAND   Final    Special Requests BOTTLES DRAWN AEROBIC AND ANAEROBIC 10CC EACH   Final    Setup Time [id number]   Final    Culture     Final    Value:        BLOOD CULTURE RECEIVED NO GROWTH TO DATE CULTURE WILL BE HELD FOR 5 DAYS BEFORE ISSUING A FINAL NEGATIVE REPORT   Report Status PENDING   Incomplete   CULTURE, BLOOD (ROUTINE X 2)     Status: Normal (Preliminary result)   Collection Time   11/12/11  8:58 PM      Component Value Range Status Comment     Specimen Description BLOOD LEFT THUMB   Final    Special Requests BOTTLES DRAWN AEROBIC  AND ANAEROBIC 10CC EACH   Final    Setup Time [id number]   Final    Culture     Final    Value:        BLOOD CULTURE RECEIVED NO GROWTH TO DATE CULTURE WILL BE HELD FOR 5 DAYS BEFORE ISSUING A FINAL NEGATIVE REPORT   Report Status PENDING   Incomplete     Studies/Results: Scheduled Meds:   . antiseptic oral rinse  15 mL Mouth Rinse q12n4p  . chlorhexidine  15 mL Mouth Rinse BID  . feeding supplement  30 mL Per Tube BID  . free water  200 mL Per Tube 6 X Daily  . imipenem-cilastatin  500 mg Intravenous Q6H  . influenza  inactive virus vaccine  0.5 mL Intramuscular Tomorrow-1000  . metoprolol  5 mg Intravenous Q6H  . pantoprazole (PROTONIX) IV  40 mg Intravenous Q24H  . pneumococcal 23 valent vaccine  0.5 mL Intramuscular Tomorrow-1000  . sodium chloride      . sodium chloride      . DISCONTD: pantoprazole (PROTONIX) IV  40 mg Intravenous Q24H  . DISCONTD: pantoprazole sodium  40 mg Per Tube Q1200   Continuous Infusions:   . dextrose 175 mL/hr at 11/15/11 0517  . feeding supplement (VITAL 1.5 CAL)     PRN Meds:.acetaminophen  Assessment/Plan:  Altered mental status Likely related to hyponatremia on baseline of fluctuating mental status due to anoxic brain injury  Dysphagia hx dysphagia-previously had PEG, has since been d/c-family reports that pt eats with assist, but must be helped with all meals-will begin clear liquids with assist and follow for tolerance  Anoxic brain injury Due to cardiac arrest at [facility] in a SNF  Hypernatremia In the setting of dehydration-much improved with free water via IV-slow IVF rate and f/u Na in AM  Dehydration Clinically resolved-slow IVF-watch Na level  Hyperkalemia/hypokalemia K+ is now low-gently replete and follow-check Mg in AM  Acute renal failure Unknown baseline crt-follow trend-crt normal today  Protein calorie  malnutrition Begin oral diet and follow-exam suggests significant recent weight loss  Hyperchloremia Resolving  Hyperthyroidism Tapazole has recently been increased due to markedly low TSH  UTI (lower urinary tract infection)/history of MDR infection sensitivity only to primaxin Awaiting speciation-cont current abx w/o change  PCN allergy Has tolerated primaxin in the past  Thrombocytopenia plts are trending downward-no evidence of bleeding-follow trend    LOS: 3 days   Anayi Bricco T 11/15/2011, 8:13 AM

## 2011-11-16 LAB — CBC
HCT: 34.5 % — ABNORMAL LOW (ref 39.0–52.0)
Hemoglobin: 11.2 g/dL — ABNORMAL LOW (ref 13.0–17.0)
RBC: 3.96 MIL/uL — ABNORMAL LOW (ref 4.22–5.81)
WBC: 3.8 10*3/uL — ABNORMAL LOW (ref 4.0–10.5)

## 2011-11-16 LAB — BASIC METABOLIC PANEL
Chloride: 113 mEq/L — ABNORMAL HIGH (ref 96–112)
GFR calc Af Amer: >90 mL/min (ref >90)
Potassium: 3 mEq/L — ABNORMAL LOW (ref 3.5–5.1)

## 2011-11-16 MED ORDER — METOPROLOL TARTRATE 12.5 MG HALF TABLET
12.5000 mg | ORAL_TABLET | Freq: Two times a day (BID) | ORAL | Status: DC
  Administered 2011-11-16 – 2011-11-19 (×6): 12.5 mg via ORAL
  Filled 2011-11-16 (×10): qty 1

## 2011-11-16 MED ORDER — SODIUM CHLORIDE 0.9 % IJ SOLN
INTRAMUSCULAR | Status: AC
  Administered 2011-11-16: 04:00:00
  Filled 2011-11-16: qty 10

## 2011-11-16 MED ORDER — DEXTROSE 5 % IV SOLN
1.0000 g | INTRAVENOUS | Status: DC
  Administered 2011-11-16 – 2011-11-19 (×4): 1 g via INTRAVENOUS
  Filled 2011-11-16 (×6): qty 10

## 2011-11-16 MED ORDER — POTASSIUM CHLORIDE CRYS ER 20 MEQ PO TBCR
40.0000 meq | EXTENDED_RELEASE_TABLET | Freq: Three times a day (TID) | ORAL | Status: DC
  Administered 2011-11-16 – 2011-11-17 (×4): 40 meq via ORAL
  Filled 2011-11-16 (×7): qty 2

## 2011-11-16 MED ORDER — PRO-STAT SUGAR FREE PO LIQD
30.0000 mL | Freq: Two times a day (BID) | ORAL | Status: DC
  Administered 2011-11-16 – 2011-11-19 (×4): 30 mL via ORAL
  Filled 2011-11-16 (×9): qty 30

## 2011-11-16 MED ORDER — PANTOPRAZOLE SODIUM 40 MG PO TBEC
40.0000 mg | DELAYED_RELEASE_TABLET | Freq: Every day | ORAL | Status: DC
  Administered 2011-11-16 – 2011-11-19 (×3): 40 mg via ORAL
  Filled 2011-11-16 (×4): qty 1

## 2011-11-16 NOTE — Progress Notes (Addendum)
Subjective:  Mr. 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 He lives in a group home setting and the nature of his anoxic brain injury has left him with a very labile psyche including extreme mania at times as well as very depressive/lethargic phases. The patient has recurrent urinary tract infections, and a history of dysphagia with prior PEG tube which is now out. The patient presented to [facility] emergency room on November 15th from his group home with complaints of worsening altered mental status and tachycardia. The patient was found to be profoundly hypernatremic in the emergency room with a sodium of 171 and was admitted to [facility].   Mr. Turnbaugh has exhibited a significant improvement in his mental status.he is awake and interactive today during my exam. He will answer some basic questions. He is smiling and interacting with his nurse. He reports crampy pains in his legs. He denies shortness of breath fevers chills nausea or vomiting.   Objective: Weight change: 6.5 kg (14 lb 5.3 oz)  Intake/Output Summary (Last 24 hours) at 11/16/11 1332 Last data filed at 11/16/11 1300  Gross per 24 hour  Intake   1810 ml  Output   1325 ml  Net    485 ml   Blood pressure 109/69, pulse 76, temperature 98.1 F (36.7 C), temperature source Oral, resp. rate 16, height 5\' 9"  (1.753 m), weight 109.2 kg (240 lb 11.9 oz), SpO2 97.00%. Temp:  [97.6 F (36.4 C)-98.5 F (36.9 C)] 98.1 F (36.7 C) (11/19 1200) Pulse Rate:  [67-82] 76  (11/19 1200) Resp:  [16-23] 16  (11/19 1200) BP: (95-109)/(46-81) 109/69 mmHg (11/19 1200) SpO2:  [93 %-99 %] 97 % (11/19 1200) Weight:  [109.2 kg (240 lb 11.9 oz)] 240 lb 11.9 oz (109.2 kg) (11/19 0500)  Physical Exam: General: No acute respiratory distress Lungs: Clear to auscultation bilaterally without wheezes or crackles Cardiovascular: Regular rate and rhythm without murmur gallop or rub normal S1 and S2 Abdomen:  Nontender, nondistended, soft, bowel sounds positive, no rebound, no ascites, no appreciable mass Extremities: No significant cyanosis, clubbing, or edema bilateral lower extremities GU: Condom cath in place  Lab Results:  [facility] 11/16/11 0400 11/15/11 0454 11/13/11 2151  NA 142 145 153*  K 3.0* 3.3* 4.6  CL 113* 116* 122*  CO2 22 23 18*  GLUCOSE 78 83 117*  BUN 12 17 35*  CREATININE 0.82 0.83 1.23  CALCIUM 8.2* 8.2* 9.8  MG 1.7 -- --  PHOS -- -- --   No results found for this basename: AST:2,ALT:2,ALKPHOS:2,BILITOT:2,PROT:2,ALBUMIN:2 in the last 72 hours No results found for this basename: LIPASE:2,AMYLASE:2 in the last 72 hours  Basename 11/16/11 0400 11/15/11 0454  WBC 3.8* 4.0  NEUTROABS -- --  HGB 11.2* 11.1*  HCT 34.5* 35.2*  MCV 87.1 90.7  PLT 46* 53*   No results found for this basename: CKTOTAL:3,CKMB:3,CKMBINDEX:3,TROPONINI:3 in the last 72 hours No results found for this basename: POCBNP:3 in the last 72 hours No results found for this basename: DDIMER:2 in the last 72 hours No results found for this basename: HGBA1C:12 in the last 72 hours No results found for this basename: CHOL:2,HDL:2,LDLCALC:2,TRIG:2,CHOLHDL:2,LDLDIRECT:2 in the last 72 hours No results found for this basename: TSH,T4TOTAL,FREET3,T3FREE,THYROIDAB in the last 72 hours No results found for this basename: VITAMINB12:2,FOLATE:2,FERRITIN:2,TIBC:2,IRON:2,RETICCTPCT:2 in the last 72 hours  Micro Results: Recent Results (from the past 240 hour(s))  CULTURE, BLOOD (ROUTINE X 2)     Status: Normal (  Preliminary result)   Collection Time   11/12/11 11:00 AM      Component Value Range Status Comment   Specimen Description BLOOD RIGHT HAND   Final    Special Requests BOTTLES DRAWN AEROBIC ONLY 6CC   Final    Setup Time [id number]   Final    Culture     Final    Value:        BLOOD CULTURE RECEIVED NO GROWTH TO DATE CULTURE WILL BE HELD FOR 5 DAYS BEFORE ISSUING A FINAL NEGATIVE REPORT   Report  Status PENDING   Incomplete   URINE CULTURE     Status: Normal   Collection Time   11/12/11 12:04 PM      Component Value Range Status Comment   Specimen Description URINE, CATHETERIZED   Final    Special Requests NONE   Final    Setup Time [id number]   Final    Colony Count >=100,000 COLONIES/ML   Final    Culture PROTEUS MIRABILIS   Final    Report Status 11/14/2011 FINAL   Final    Organism ID, Bacteria PROTEUS MIRABILIS   Final   URINE CULTURE     Status: Normal   Collection Time   11/12/11 12:04 PM      Component Value Range Status Comment   Specimen Description URINE, CATHETERIZED   Final    Special Requests ADDED ON 213086 @2105    Final    Setup Time [id number]   Final    Colony Count >=100,000 COLONIES/ML   Final    Culture PROTEUS MIRABILIS   Final    Report Status 11/14/2011 FINAL   Final    Organism ID, Bacteria PROTEUS MIRABILIS   Final   MRSA PCR SCREENING     Status: Normal   Collection Time   11/12/11  8:11 PM      Component Value Range Status Comment   MRSA by PCR NEGATIVE  NEGATIVE  Final   CULTURE, BLOOD (ROUTINE X 2)     Status: Normal (Preliminary result)   Collection Time   11/12/11  8:49 PM      Component Value Range Status Comment   Specimen Description BLOOD LEFT HAND   Final    Special Requests BOTTLES DRAWN AEROBIC AND ANAEROBIC 10CC EACH   Final    Setup Time [id number]   Final    Culture     Final    Value:        BLOOD CULTURE RECEIVED NO GROWTH TO DATE CULTURE WILL BE HELD FOR 5 DAYS BEFORE ISSUING A FINAL NEGATIVE REPORT   Report Status PENDING   Incomplete   CULTURE, BLOOD (ROUTINE X 2)     Status: Normal (Preliminary result)   Collection Time   11/12/11  8:58 PM      Component Value Range Status Comment   Specimen Description BLOOD LEFT THUMB   Final    Special Requests BOTTLES DRAWN AEROBIC AND ANAEROBIC 10CC EACH   Final    Setup Time [id number]   Final    Culture     Final    Value:        BLOOD CULTURE RECEIVED NO GROWTH TO  DATE CULTURE WILL BE HELD FOR 5 DAYS BEFORE ISSUING A FINAL NEGATIVE REPORT   Report Status PENDING   Incomplete     Studies/Results: Scheduled Meds:    . antiseptic oral rinse  15 mL Mouth Rinse q12n4p  . chlorhexidine  15 mL Mouth  Rinse BID  . feeding supplement  30 mL Per Tube BID  . imipenem-cilastatin  500 mg Intravenous Q6H  . metoprolol  5 mg Intravenous Q6H  . pantoprazole (PROTONIX) IV  40 mg Intravenous Q24H  . potassium chloride  20 mEq Oral BID  . sodium chloride       Continuous Infusions:    . dextrose 75 mL/hr at 11/16/11 1300   PRN Meds:.acetaminophen  Assessment/Plan:  Altered mental status Likely related to hyponatremia on top of a baseline of fluctuating mental status due to anoxic brain injury-the patient appears to be back to his normal mental status at the present time  Dysphagia hx dysphagia-previously had PEG, has since been d/c-family reports that pt eats with assist, but must be helped with all meals-is currently tolerating his usual outpatient diet without difficulty   Anoxic brain injury Due to cardiac arrest at [facility] in a SNF  Hypernatremia In the setting of dehydration-much improved with free water via IV-with continued improvement in his sodium we will discontinue IV fluids altogether and monitor for additional 24 hours  Dehydration Clinically resolved-will discontinue IV fluid today  Hyperkalemia/hypokalemia K+ remains low-continue to replete and follow-Mg level is acceptable  Acute renal failure Unknown baseline crt-creatinine/renal function has normalized  Protein calorie malnutrition Continue current diet and assist with all meals  Hyperchloremia Resolving-should continue to improve-followup in a.m.  Hyperthyroidism Tapazole has recently been increased due to markedly low TSH-will need to recheck a TSH in 4-6 week  Proteus mirabilis UTI (lower urinary tract infection)/history of MDR infection sensitivity only  to primaxin Awaiting sensitivities-cont current abx w/o change until sensitivities are available  PCN allergy Has tolerated primaxin in the past  Thrombocytopenia plts are trending downward-no evidence of bleeding-there is no evidence of the patient has been on heparin products during this hospital stay-we will followup his platelet count in the morning   LOS: 4 days   MCCLUNG,JEFFREY T 11/16/2011, 1:32 PM

## 2011-11-16 NOTE — Progress Notes (Signed)
Physical Therapy Evaluation Patient Details Name: [name] MRN: [id number] DOB: 1966/05/24 Today's Date: 11/16/2011  Problem List:  Patient Active Problem List  Diagnoses  . Dysphagia  . Anoxic brain injury  . Hypernatremia  . Dehydration  . Hyperkalemia  . Acute renal failure  . Protein calorie malnutrition  . Hyperchloremia  . Hyperthyroidism  . UTI (lower urinary tract infection)    Past Medical History:  Past Medical History  Diagnosis Date  . Addison disease   . Anoxic brain injury   . A-fib   . Seizure disorder   . Dysphagia   . History of recurrent UTIs   . Hyperlipemia   . Aspiration pneumonia   . Mental disorder   . Anxiety    Past Surgical History:  Past Surgical History  Procedure Date  . Nephrectomy unknown    PT Assessment/Plan/Recommendation PT Assessment Clinical Impression Statement: Pt is a 45 y/o male admitted with increased AMS with a history of anoxic brain injury as well as the below problem list.  Pt would benefit from acute PT to maximize independence and decrease burden of care. PT Recommendation/Assessment: Patient will need skilled PT in the acute care venue PT Problem List: Decreased strength;Decreased activity tolerance;Decreased balance;Decreased mobility;Decreased cognition Barriers to Discharge: None PT Therapy Diagnosis : Difficulty walking;Generalized weakness PT Plan PT Frequency: Min 3X/week PT Treatment/Interventions: DME instruction;Gait training;Functional mobility training;Balance training;Patient/family education PT Recommendation Follow Up Recommendations: Other (comment);Home health PT;24 hour supervision/assistance (Return to group home.) Equipment Recommended: None recommended by PT;Other (comment) (May benefit from RW.  Unsure if pt owns one.) PT Goals  Acute Rehab PT Goals PT Goal Formulation: With patient Time For Goal Achievement: 7 days Pt will go Supine/Side to Sit: with supervision PT Goal: Supine/Side  to Sit - Progress: Other (comment) (Set today.) Pt will Transfer Sit to Stand/Stand to Sit: with supervision PT Transfer Goal: Sit to Stand/Stand to Sit - Progress: Other (comment) (Set today.) Pt will Ambulate: 51 - 150 feet;with min assist;with least restrictive assistive device PT Goal: Ambulate - Progress: Other (comment) (Set today.)  PT Evaluation Precautions/Restrictions  Precautions Precautions: Fall Required Braces or Orthoses: No Restrictions Weight Bearing Restrictions: No Prior Functioning  Home Living Lives With: Other (Comment) (At group home) Receives Help From: Other (Comment) (Group home staff) Type of Home: Other (Comment) (Group home) Home Layout: One level Home Access: Level entry Home Adaptive Equipment:  (Unsure.  Pt poor historian and unable to reach mother.) Additional Comments: Attempted to contact mother via telephone to determine PLOF however unsuccessful.  Pt poor historian. Prior Function Level of Independence: Requires assistive device for independence;Needs assistance with ADLs;Needs assistance with homemaking;Needs assistance with gait;Needs assistance with tranfers;Other (comment) (Pt poor historian and unable to reach mother.) Able to Take Stairs?: No Driving: No Cognition Cognition Arousal/Alertness: Awake/alert Overall Cognitive Status: Impaired Attention: Impaired Current Attention Level: Sustained;Other (comment) (For 1-2 minutes at a time.) Memory: Appears impaired Memory Deficits: Poor historian Orientation Level: Oriented to person;Oriented to place Safety/Judgement: Decreased awareness of safety precautions;Decreased safety judgement for tasks assessed Decreased Safety/Judgement: Impulsive;Decreased awareness of need for assistance Awareness of Deficits: Decreased awareness of deficits Problem Solving: Requires assistance for problem solving Sensation/Coordination Sensation Light Touch: Appears Intact Stereognosis: Not  tested Hot/Cold: Not tested Proprioception: Not tested Coordination Gross Motor Movements are Fluid and Coordinated: Yes Fine Motor Movements are Fluid and Coordinated: Not tested Extremity Assessment RUE Assessment RUE Assessment: Not tested LUE Assessment LUE Assessment: Not tested RLE Assessment RLE Assessment: Within Functional  Limits LLE Assessment LLE Assessment: Within Functional Limits Pain 0/10 pain with treatment. Mobility (including Balance) Bed Mobility Bed Mobility: Yes Rolling Right: 1: +2 Total assist;Patient percentage (comment);With rail (+2 total assist (pt=50%)) Rolling Right Details (indicate cue type and reason): Facilitation to trunk to rotate with max cues for sequence and safety. Rolling Left: 1: +2 Total assist;Patient percentage (comment);With rail (+2 total assist (pt=50%)) Rolling Left Details (indicate cue type and reason): Assist to trunk to facilitate rotation with max cues for sequence. Right Sidelying to Sit: 1: +2 Total assist;Patient percentage (comment);HOB flat (+2 total assist (pt=50%); 2 trials.) Right Sidelying to Sit Details (indicate cue type and reason): Assist to trunk for translation to midline.  Cues for sequence and safety. Sit to Supine - Right: 1: +2 Total assist;Patient percentage (comment);HOB flat (+2 total assist (pt=50%)) Sit to Supine - Right Details (indicate cue type and reason): Assist to control trunk descent with facilitation at bilateral LEs for onto bed.  Cues for sequence and safety. Transfers Transfers: Yes Sit to Stand: 1: +2 Total assist;Patient percentage (comment);From bed;Other (comment) (+ 2 total assist (pt=50%); 2 trials.) Sit to Stand Details (indicate cue type and reason): Assist for trunk anterior translation with cues for safety and sequence. Stand to Sit: 1: +2 Total assist;Patient percentage (comment);To bed;Other (comment) (+2 total assist (pt=50%); 2 trials.) Stand to Sit Details: Assist for trunk slow  descent with cues for safety.  Pt impulsively sits back down. Ambulation/Gait Ambulation/Gait: Yes Ambulation/Gait Assistance: 1: +2 Total assist;Patient percentage (comment) (+2 total assist (pt=60%)) Ambulation/Gait Assistance Details (indicate cue type and reason): Assist for balance with cues for extended posture and weight shifting left and right to advance bilateral LEs. Ambulation Distance (Feet): 5 Feet Assistive device: 2 person hand held assist Gait Pattern: Decreased step length - right;Decreased step length - left;Decreased weight shift to right;Decreased weight shift to left;Trunk flexed Stairs: No Wheelchair Mobility Wheelchair Mobility: No  Posture/Postural Control Posture/Postural Control: No significant limitations Balance Balance Assessed: No Exercise    End of Session PT - End of Session Equipment Utilized During Treatment: Gait belt Activity Tolerance: Patient tolerated treatment well;Other (comment) (Pt with incontinent BM before with increased time to clean.) Patient left: in bed;with call bell in reach;with bed alarm set Nurse Communication: Mobility status for transfers;Mobility status for ambulation General Behavior During Session: [facility] for tasks performed Cognition: Impaired, at baseline  Cephus Shelling 11/16/2011, 1:39 PM  11/16/2011 Cephus Shelling, PT, DPT [phone]

## 2011-11-17 LAB — BASIC METABOLIC PANEL
BUN: 9 mg/dL (ref 6–23)
Calcium: 8.6 mg/dL (ref 8.4–10.5)
Chloride: 113 mEq/L — ABNORMAL HIGH (ref 96–112)
Creatinine, Ser: 0.71 mg/dL (ref 0.50–1.35)
GFR calc Af Amer: >90 mL/min (ref >90)
GFR calc non Af Amer: >90 mL/min (ref >90)

## 2011-11-17 LAB — GLUCOSE, CAPILLARY: Glucose-Capillary: 70 mg/dL (ref 70–99)

## 2011-11-17 LAB — CBC
HCT: 35.3 % — ABNORMAL LOW (ref 39.0–52.0)
MCHC: 34.6 g/dL (ref 30.0–36.0)
Platelets: 52 10*3/uL — ABNORMAL LOW (ref 150–400)
RDW: 13.9 % (ref 11.5–15.5)
WBC: 4.2 10*3/uL (ref 4.0–10.5)

## 2011-11-17 MED ORDER — SODIUM CHLORIDE 0.9 % IJ SOLN
INTRAMUSCULAR | Status: AC
  Administered 2011-11-17: 05:00:00
  Filled 2011-11-17: qty 20

## 2011-11-17 MED ORDER — DEXTROSE 5 % IV SOLN
INTRAVENOUS | Status: DC
  Administered 2011-11-17 (×2): via INTRAVENOUS

## 2011-11-17 NOTE — Progress Notes (Addendum)
Subjective: Awake and alert, smiling, no issues per RN  Objective: Weight change: -2 kg (-4 lb 6.6 oz)  Intake/Output Summary (Last 24 hours) at 11/17/11 1330 Last data filed at 11/17/11 1222  Gross per 24 hour  Intake    140 ml  Output   1700 ml  Net  -1560 ml   Blood pressure 115/83, pulse 75, temperature 97.4 F (36.3 C), temperature source Oral, resp. rate 16, height 5\' 9"  (1.753 m), weight 107.2 kg (236 lb 5.3 oz), SpO2 99.00%.  Physical Exam: General: Alert and awake, not in any acute distress. HEENT: anicteric sclera, pupils reactive to light and accommodation, EOMI CVS: S1-S2 clear, no murmur rubs or gallops Chest: clear to auscultation bilaterally, no wheezing, rales or rhonchi Abdomen: soft nontender, nondistended, normal bowel sounds, no organomegaly Extremities: no cyanosis, clubbing or edema noted bilaterally, contractures   Lab Results: Basic Metabolic Panel:  Lab 11/17/11 1610 11/16/11 0400 11/13/11 1130  NA 143 142 --  K 3.6 3.0* --  CL 113* 113* --  CO2 22 22 --  GLUCOSE 68* 78 --  BUN 9 12 --  CREATININE 0.71 0.82 --  CALCIUM 8.6 8.2* --  MG -- 1.7 --  PHOS -- -- 4.5   Liver Function Tests:  Lab 11/12/11 1038  AST 27  ALT 26  ALKPHOS 85  BILITOT 1.3*  PROT 8.6*  ALBUMIN 3.9   CBC:  Lab 11/17/11 0510 11/16/11 0400 11/12/11 1038  WBC 4.2 3.8* --  NEUTROABS -- -- 4.5  HGB 12.2* 11.2* --  HCT 35.3* 34.5* --  MCV 85.7 87.1 --  PLT 52* 46* --   CBG:  Lab 11/17/11 0710 11/14/11 2350 11/13/11 0038 11/12/11 2008 11/12/11 1125  GLUCAP 70 89 86 81 100*     Micro Results: Recent Results (from the past 240 hour(s))  CULTURE, BLOOD (ROUTINE X 2)     Status: Normal (Preliminary result)   Collection Time   11/12/11 11:00 AM      Component Value Range Status Comment   Specimen Description BLOOD RIGHT HAND   Final    Special Requests BOTTLES DRAWN AEROBIC ONLY 6CC   Final    Setup Time [id number]   Final    Culture     Final    Value:         BLOOD CULTURE RECEIVED NO GROWTH TO DATE CULTURE WILL BE HELD FOR 5 DAYS BEFORE ISSUING A FINAL NEGATIVE REPORT   Report Status PENDING   Incomplete   URINE CULTURE     Status: Normal   Collection Time   11/12/11 12:04 PM      Component Value Range Status Comment   Specimen Description URINE, CATHETERIZED   Final    Special Requests NONE   Final    Setup Time [id number]   Final    Colony Count >=100,000 COLONIES/ML   Final    Culture PROTEUS MIRABILIS   Final    Report Status 11/14/2011 FINAL   Final    Organism ID, Bacteria PROTEUS MIRABILIS   Final   URINE CULTURE     Status: Normal   Collection Time   11/12/11 12:04 PM      Component Value Range Status Comment   Specimen Description URINE, CATHETERIZED   Final    Special Requests ADDED ON [id number] @2105    Final    Setup Time [id number]   Final    Colony Count >=100,000 COLONIES/ML   Final    Culture PROTEUS MIRABILIS  Final    Report Status 11/14/2011 FINAL   Final    Organism ID, Bacteria PROTEUS MIRABILIS   Final   MRSA PCR SCREENING     Status: Normal   Collection Time   11/12/11  8:11 PM      Component Value Range Status Comment   MRSA by PCR NEGATIVE  NEGATIVE  Final   CULTURE, BLOOD (ROUTINE X 2)     Status: Normal (Preliminary result)   Collection Time   11/12/11  8:49 PM      Component Value Range Status Comment   Specimen Description BLOOD LEFT HAND   Final    Special Requests BOTTLES DRAWN AEROBIC AND ANAEROBIC 10CC EACH   Final    Setup Time [id number]   Final    Culture     Final    Value:        BLOOD CULTURE RECEIVED NO GROWTH TO DATE CULTURE WILL BE HELD FOR 5 DAYS BEFORE ISSUING A FINAL NEGATIVE REPORT   Report Status PENDING   Incomplete   CULTURE, BLOOD (ROUTINE X 2)     Status: Normal (Preliminary result)   Collection Time   11/12/11  8:58 PM      Component Value Range Status Comment   Specimen Description BLOOD LEFT THUMB   Final    Special Requests BOTTLES DRAWN AEROBIC AND ANAEROBIC 10CC  EACH   Final    Setup Time [id number]   Final    Culture     Final    Value:        BLOOD CULTURE RECEIVED NO GROWTH TO DATE CULTURE WILL BE HELD FOR 5 DAYS BEFORE ISSUING A FINAL NEGATIVE REPORT   Report Status PENDING   Incomplete     Studies/Results: Ct Head Wo Contrast  11/12/2011  *RADIOLOGY REPORT*  Clinical Data: Fatigue  CT HEAD WITHOUT CONTRAST  Technique:  Contiguous axial images were obtained from the base of the skull through the vertex without contrast.  Comparison: Head CT 10/04/2011  Findings: No acute intracranial hemorrhage.  No focal mass lesion. No CT evidence of acute infarction.   No midline shift or mass effect.  No hydrocephalus.  Basilar cisterns are patent. Age advanced atrophy is similar to prior. Paranasal sinuses and mastoid air cells are clear.  Orbits are normal.  IMPRESSION: No acute intracranial findings.  No change from prior.  Original Report Authenticated By: Genevive Bi, M.D.   Ir US Guide Vasc Access Left  11/15/2011  * RADIOLOGY REPORT *  Clinical data: Renal failure, UTI, needs access for antibiotics  PICC PLACEMENT WITH ULTRASOUND AND FLUOROSCOPY  Technique: After written informed consent was obtained, patient was placed in the supine position on angiographic table. Patency of the left brachial vein was confirmed with ultrasound with image documentation. An appropriate skin site was determined. Skin site was marked. Region was prepped using maximum barrier technique including cap and mask, sterile gown, sterile gloves, large sterile sheet, and Chlorhexidine   as cutaneous antisepsis.  The region was infiltrated locally with 1% lidocaine.   Under real-time ultrasound guidance, the left brachial vein was accessed with a 21 gauge micropuncture needle; the needle tip within the vein was confirmed with ultrasound image documentation.   Needle exchanged over a 018 guidewire for a peel-away sheath, through which a 5-French dual- lumen power injectable PICC trimmed  to 43cm was advanced, positioned with its tip near the cavoatrial junction.  Spot chest radiograph confirms appropriate catheter position.  Catheter was flushed  per protocol and secured externally with 0-Prolene sutures. The patient tolerated procedure well, with no immediate complication.  IMPRESSION: Technically successful five [place] dual  lumen power injectable PICC placement  Original Report Authenticated By: Osa Craver, M.D.   Dg Chest Portable 1 View  11/12/2011  *RADIOLOGY REPORT*  Clinical Data: Addison's disease.  Tachycardia.  Weakness.  PORTABLE CHEST - 1 VIEW  Comparison: 10/07/2011  Findings: Low lung volumes as seen however both lungs are clear. Heart size is within normal limits.  No evidence of pleural effusion.  IMPRESSION: Low lung volumes.  No active disease.  Original Report Authenticated By: Danae Orleans, M.D.   Ir Fluoro Guide Cv Midline Picc Left  11/15/2011  * RADIOLOGY REPORT *  Clinical data: Renal failure, UTI, needs access for antibiotics  PICC PLACEMENT WITH ULTRASOUND AND FLUOROSCOPY  Technique: After written informed consent was obtained, patient was placed in the supine position on angiographic table. Patency of the left brachial vein was confirmed with ultrasound with image documentation. An appropriate skin site was determined. Skin site was marked. Region was prepped using maximum barrier technique including cap and mask, sterile gown, sterile gloves, large sterile sheet, and Chlorhexidine   as cutaneous antisepsis.  The region was infiltrated locally with 1% lidocaine.   Under real-time ultrasound guidance, the left brachial vein was accessed with a 21 gauge micropuncture needle; the needle tip within the vein was confirmed with ultrasound image documentation.   Needle exchanged over a 018 guidewire for a peel-away sheath, through which a 5-French dual- lumen power injectable PICC trimmed to 43cm was advanced, positioned with its tip near the cavoatrial  junction.  Spot chest radiograph confirms appropriate catheter position.  Catheter was flushed per protocol and secured externally with 0-Prolene sutures. The patient tolerated procedure well, with no immediate complication.  IMPRESSION: Technically successful five [place] dual  lumen power injectable PICC placement  Original Report Authenticated By: Osa Craver, M.D.    Medications: Scheduled Meds:   . antiseptic oral rinse  15 mL Mouth Rinse q12n4p  . cefTRIAXone (ROCEPHIN) IV  1 g Intravenous Q24H  . chlorhexidine  15 mL Mouth Rinse BID  . feeding supplement  30 mL Oral BID  . metoprolol tartrate  12.5 mg Oral BID  . pantoprazole  40 mg Oral Q1200  . potassium chloride  40 mEq Oral TID  . sodium chloride      . DISCONTD: feeding supplement  30 mL Per Tube BID  . DISCONTD: imipenem-cilastatin  500 mg Intravenous Q6H  . DISCONTD: metoprolol  5 mg Intravenous Q6H  . DISCONTD: pantoprazole (PROTONIX) IV  40 mg Intravenous Q24H  . DISCONTD: potassium chloride  20 mEq Oral BID   Continuous Infusions:   . dextrose 50 mL/hr at 11/17/11 1200  . DISCONTD: dextrose 75 mL/hr at 11/16/11 1300   PRN Meds:.acetaminophen  Assessment/Plan: Altered mental status: Improving  - Likely related to hypernatremia on top of a baseline of fluctuating mental status due to anoxic brain injury  Dysphagia  hx dysphagia-previously had PEG, has since been d/c-family reports that pt eats with assist, but must be helped with all meals-is currently tolerating his usual outpatient diet without difficulty   Anoxic brain injury  Due to cardiac arrest at [facility] in a SNF   Hypernatremia: Improved  In the setting of dehydration-much improved with free water via IV-with continued improvement in his sodium, continue low maintenance IV fluids until eating better  Acute renal failure:  Resolved  Unknown baseline crt-creatinine/renal function has normalized   Protein calorie malnutrition    Continue current diet and assist with all meals   Hyperchloremia: Improving   Hyperthyroidism  Tapazole has recently been increased due to markedly low TSH-will need to recheck a TSH in 4-6 week   Proteus mirabilis UTI (lower urinary tract infection)/history of MDR infection sensitivity only to primaxin  Continue Rocephin per sensitivities  Thrombocytopenia  plts are trending upwards today, no evidence of bleeding continue to monitor closely  Disposition: Transfer to regular floor, needs full assistance with meals, bed alarms on all the time, preferably close to the nursing station for closer monitoring due to history of anoxic brain injury.       LOS: 5 days   RAI,RIPUDEEP 11/17/2011, 1:30 PM

## 2011-11-17 NOTE — Plan of Care (Signed)
Problem: Phase II Progression Outcomes Goal: Vital signs remain stable Vitals signs have remained stable.  Problem: Phase III Progression Outcomes Goal: Activity at appropriate level-compared to baseline (UP IN CHAIR FOR HEMODIALYSIS)  Pt refudsed to work with thearpy today and get  Goal: Voiding independently Pt incontinent of urine and bowel

## 2011-11-17 NOTE — Progress Notes (Signed)
Occupational Therapy Evaluation Patient Details Name: [name] MRN: [id number] DOB: 1966-10-18 Today's Date: 11/17/2011  Problem List:  Patient Active Problem List  Diagnoses  . Dysphagia  . Anoxic brain injury  . Hypernatremia  . Acute renal failure  . Protein calorie malnutrition  . Hyperchloremia  . Hyperthyroidism  . UTI (lower urinary tract infection)    Past Medical History:  Past Medical History  Diagnosis Date  . Addison disease   . Anoxic brain injury   . A-fib   . Seizure disorder   . Dysphagia   . History of recurrent UTIs   . Hyperlipemia   . Aspiration pneumonia   . Mental disorder   . Anxiety    Past Surgical History:  Past Surgical History  Procedure Date  . Nephrectomy unknown    OT Assessment/Plan/Recommendation OT Assessment Clinical Impression Statement: This 45 y.o. male with h/o anoxic BI.  Per pt. mother, pt. resided at [facility] and was max-total A with all ADLs.  Pt. appears to be at baseline, and demonstrates behavioral issues that impede pt's abillity to participate.  No acute OT needs identified.  Recommend f/u OT at SNF OT Recommendation/Assessment: All further OT needs can be met in the next venue of care OT Problem List: Decreased strength;Decreased activity tolerance;Impaired balance (sitting and/or standing);Decreased cognition;Decreased safety awareness OT Therapy Diagnosis : Generalized weakness OT Recommendation Recommendations for Other Services: PT consult Follow Up Recommendations: Skilled nursing facility Equipment Recommended: None recommended by OT Individuals Consulted Consulted and Agree with Results and Recommendations: Patient unable/family or caregiver not available OT Goals    OT Evaluation Precautions/Restrictions  Precautions Precautions: Fall Required Braces or Orthoses: No Restrictions Weight Bearing Restrictions: No Prior Functioning Home Living Additional Comments: Spoke with pt's mother who reports  pt. resides at [facility] facillity Prior Function Level of Independence: Needs assistance with ADLs;Needs assistance with gait;Needs assistance with tranfers;Other (comment) (Mother reports imulsivity with mobility and h/o falls) Bath: Maximal Toileting: Total (Pt wears adult diapers) Dressing: Total Grooming: Maximal (variable based on pt. behaviors and participation) Feeding: Maximal (Mother reports pt. impulsive with feeding therefore staff fe) Able to Take Stairs?: No Driving: No Vocation: On disability Comments: Pt's mother reports that pt. gets up to wheelchair with assistance, but no longer is allowed to self propel due to questionable impulsivity and decreased safety awareness.  She reports pt. behavior as variable and that he requires signficant encouragement at times to participate ADL ADL Eating/Feeding: Simulated;Maximal assistance Where Assessed - Eating/Feeding: Bed level Grooming: Performed;Brushing hair;Wash/dry face;Maximal assistance (mod A to wash face; max A to brush hair.  Max VCs) Where Assessed - Grooming: Supine, head of bed up Upper Body Bathing: Simulated;+1 Total assistance Where Assessed - Upper Body Bathing: Supine, head of bed up Lower Body Bathing: Simulated;+1 Total assistance Where Assessed - Lower Body Bathing: Supine, head of bed up Upper Body Dressing: Simulated;+1 Total assistance Where Assessed - Upper Body Dressing: Supine, head of bed up Lower Body Dressing: Simulated;+1 Total assistance Where Assessed - Lower Body Dressing: Supine, head of bed up Toilet Transfer: Not assessed (Pt resisted attempts OOB) Toileting - Clothing Manipulation: Simulated;+1 Total assistance Where Assessed - Toileting Clothing Manipulation: Supine, head of bed flat Toileting - Hygiene: Simulated;+1 Total assistance Where Assessed - Toileting Hygiene: Rolling right and/or left Tub/Shower Transfer: Not assessed ADL Comments: Pt supine.  Attempted EOB and OOB  acitvity with pt. however, every time therapist assisted pt. with legs off the bed, he would put them back  on the bed.  If therapists attempted to assist with upper body, pt. would sit partially up with max A, then return to supine.  Attempted to assist pt. multiple times with the same results Vision/Perception  Vision - History Baseline Vision: Other (comment) Visual History:  (unable to accurately asses due to decrease direction followi) Cognition Cognition Arousal/Alertness: Lethargic (Pt. awoke with max encouragement) Overall Cognitive Status: History of cognitive impairments History of Cognitive Impairment:  (unable to determine.  Pt. inappropriate at times) Attention: Impaired Current Attention Level: Sustained (10 seconds) Memory:  (pt. with h/o memory problems) Orientation Level:  (Pt. did not answer.  Appears to be oriented to self) Safety/Judgement: Decreased safety judgement for tasks assessed Decreased Safety/Judgement: Impulsive Awareness of Deficits: Decreased awareness of deficits Problem Solving: Requires assistance for problem solving Cognition - Other Comments: Pt. with h/o anoxic brain injury with significant cognitive deficits (Pt follows one step commands inconsistently) Sensation/Coordination Sensation Stereognosis: Not tested Coordination Gross Motor Movements are Fluid and Coordinated: No (pt. appears to have hypotonia - no tremors noted) Fine Motor Movements are Fluid and Coordinated: No (Pt. appears to be low toned) Extremity Assessment RUE Assessment RUE Assessment:  (AROM WFL) LUE Assessment LUE Assessment:  (AROM WFL) Mobility  Bed Mobility Bed Mobility: Yes Rolling Right: 2: Max assist Transfers Transfers: No Exercises   End of Session OT - End of Session Activity Tolerance: Other (comment) (limited due to behavioral issues due to [facility]) Patient left: in bed;with call bell in reach Nurse Communication: Other (comment) (Results of  session) General Behavior During Session: Lethargic (inappropriate) Cognition: Impaired, at baseline   Geno Sydnor M 11/17/2011, 3:07 PM

## 2011-11-18 ENCOUNTER — Inpatient Hospital Stay (HOSPITAL_COMMUNITY): Payer: Medicare Other

## 2011-11-18 LAB — BASIC METABOLIC PANEL
CO2: 23 mEq/L (ref 19–32)
Chloride: 113 mEq/L — ABNORMAL HIGH (ref 96–112)
GFR calc Af Amer: >90 mL/min (ref >90)
Sodium: 143 mEq/L (ref 135–145)

## 2011-11-18 LAB — CULTURE, BLOOD (ROUTINE X 2): Culture  Setup Time: 201211151806

## 2011-11-18 LAB — CBC
Platelets: 56 10*3/uL — ABNORMAL LOW (ref 150–400)
RBC: 4.2 MIL/uL — ABNORMAL LOW (ref 4.22–5.81)
RDW: 14.1 % (ref 11.5–15.5)
WBC: 4.2 10*3/uL (ref 4.0–10.5)

## 2011-11-18 MED ORDER — POTASSIUM CHLORIDE 10 MEQ/100ML IV SOLN
10.0000 meq | INTRAVENOUS | Status: AC
  Administered 2011-11-18 (×2): 10 meq via INTRAVENOUS
  Filled 2011-11-18 (×4): qty 100

## 2011-11-18 MED ORDER — SODIUM CHLORIDE 0.9 % IJ SOLN
10.0000 mL | INTRAMUSCULAR | Status: DC | PRN
  Administered 2011-11-18 – 2011-11-20 (×4): 10 mL

## 2011-11-18 MED ORDER — ENSURE PUDDING PO PUDG
1.0000 | Freq: Four times a day (QID) | ORAL | Status: DC
  Administered 2011-11-19 (×4): 1 via ORAL

## 2011-11-18 MED ORDER — BOOST / RESOURCE BREEZE PO LIQD
1.0000 | Freq: Three times a day (TID) | ORAL | Status: DC
  Administered 2011-11-19 (×3): 1 via ORAL

## 2011-11-18 MED ORDER — POTASSIUM CHLORIDE CRYS ER 20 MEQ PO TBCR
40.0000 meq | EXTENDED_RELEASE_TABLET | Freq: Three times a day (TID) | ORAL | Status: DC
  Administered 2011-11-19: 40 meq via ORAL
  Filled 2011-11-18 (×3): qty 2

## 2011-11-18 MED ORDER — ENOXAPARIN SODIUM 40 MG/0.4ML ~~LOC~~ SOLN
40.0000 mg | SUBCUTANEOUS | Status: DC
  Administered 2011-11-18: 40 mg via SUBCUTANEOUS
  Filled 2011-11-18 (×2): qty 0.4

## 2011-11-18 MED ORDER — MAGNESIUM SULFATE IN D5W 10-5 MG/ML-% IV SOLN
1.0000 g | Freq: Once | INTRAVENOUS | Status: AC
  Administered 2011-11-18: 1 g via INTRAVENOUS
  Filled 2011-11-18: qty 100

## 2011-11-18 MED ORDER — DEXTROSE 5 % IV SOLN
INTRAVENOUS | Status: DC
  Administered 2011-11-19: 03:00:00 via INTRAVENOUS

## 2011-11-18 MED ORDER — FREE WATER
100.0000 mL | Freq: Four times a day (QID) | Status: DC
  Filled 2011-11-18 (×4): qty 100

## 2011-11-18 NOTE — Progress Notes (Signed)
Clinical social worker completed psychosocial assessment, please see pt shadow chart. Pt plans to return to [facility], a skilled nursing facility when medically stable. Pt mother hopes for pt to resume physical therapy once patient returns to skilled nursing facility. Clinical social worker will continue to follow to assist with pt dc plans.   Catha Gosselin, Theresia Majors  [phone] .11/18/2011 17:11pm

## 2011-11-18 NOTE — Progress Notes (Addendum)
Nutrition Follow-up  Patient now receiving Dys 2 honey diet, same as PTA.  Supplements:  Prostat 30 ml PO BID.  TF is off.  Intake is poor with 40-50% meal completion.  Medications reviewed.  BMET    Component Value Date/Time   NA 143 11/18/2011 0545   K 3.2* 11/18/2011 0545   CL 113* 11/18/2011 0545   CO2 23 11/18/2011 0545   GLUCOSE 69* 11/18/2011 0545   BUN 7 11/18/2011 0545   CREATININE 0.69 11/18/2011 0545   CALCIUM 8.6 11/18/2011 0545   CALCIUM 8.7 02/19/2011 1515   GFRNONAA >90 11/18/2011 0545   GFRAA >90 11/18/2011 0545   Weight:  102.7 kg  Re-estimated needs:  2000-2200 kcals, 100-110 grams protein daily  Nutrition Dx: 1. Inadequate oral intake, ongoing. 2. Inadequate fluid intake, resolved.  Goal:  Tolerate diet advancement with intake to meet nutrition needs, unmet with 40-50% meal completion.  Plan:    Ensure Pudding QID  Resource Breeze TID (thickened to honey consistency)

## 2011-11-18 NOTE — Progress Notes (Signed)
Clinical social worker attempted to complete patient pscyhosocial assessment. However patient communication was very garbled and only attempted to say one word and patient turned to go back to sleep. CSW attempted to arouse patient, but unable. CSW spoke with medical staff who stated patient will sometimes communicate however, sometimes patient refused. Per chart review, patient from a group home and patient mother is requesting that physical therapy continues at group home. CSW left message for patient mother regarding discharge plans and confirmation of patient group home.   Catha Gosselin, Theresia Majors  [phone] .11/18/2011    14:33pm

## 2011-11-18 NOTE — Progress Notes (Signed)
UTILIZATION REVIEW COMPLETE [name] 11/18/2011 [phone] OR [phone]  

## 2011-11-18 NOTE — Progress Notes (Signed)
Austin Herd CSN:[id number],MRN:[id number] is a 45 y.o. male,  Outpatient Primary MD for the patient is [name], MD Chief Complaint  Patient presents with  . Fatigue      11/18/2011   Subjective:   Adon Gehlhausen today has, is in no distress, opens eyes but will not answer questions   Objective:   Filed Vitals:   11/17/11 1220 11/17/11 1832 11/17/11 2105 11/18/11 0615  BP: 115/83 96/69 113/79 96/69  Pulse: 75 68 59 68  Temp: 97.4 F (36.3 C) 98.9 F (37.2 C) 97.1 F (36.2 C) 96.6 F (35.9 C)  TempSrc: Oral  Axillary Axillary  Resp: 16 20 20 20   Height:      Weight:    102.7 kg (226 lb 6.6 oz)  SpO2: 99% 100% 100% 99%   Wt Readings from Last 3 Encounters:  11/18/11 102.7 kg (226 lb 6.6 oz)    Intake/Output Summary (Last 24 hours) at 11/18/11 1439 Last data filed at 11/18/11 1424  Gross per 24 hour  Intake      0 ml  Output   1525 ml  Net  -1525 ml   Exam Arousable, but will not answer questions, per RN waxing waning Neur.ststus, moves all 4 extrimities .AT,PERRAL Supple Neck,No JVD, No cervical lymphadenopathy appriciated.  Symmetrical Chest wall movement, Good air movement bilaterally, CTAB RRR,No Gallops,Rubs or new Murmurs, No Parasternal Heave +ve B.Sounds, Abd Soft, Non tender, No organomegaly appriciated, No rebound -guarding or rigidity. No Cyanosis, Clubbing or edema, No new Rash or bruise    Data Review  CBC  Lab 11/18/11 0545 11/17/11 0510 11/16/11 0400 11/15/11 0454 11/13/11 0630 11/12/11 1038  WBC 4.2 4.2 3.8* 4.0 7.9 --  HGB 12.2* 12.2* 11.2* 11.1* 15.7 --  HCT 36.3* 35.3* 34.5* 35.2* 51.1 --  PLT 56* 52* 46* 53* 87* --  MCV 86.4 85.7 87.1 90.7 96.4 --  MCH 29.0 29.6 28.3 28.6 29.6 --  MCHC 33.6 34.6 32.5 31.5 30.7 --  RDW 14.1 13.9 14.0 14.3 15.4 --  LYMPHSABS -- -- -- -- -- 3.1  MONOABS -- -- -- -- -- 0.9  EOSABS -- -- -- -- -- 0.1  BASOSABS -- -- -- -- -- 0.0  BANDABS -- -- -- -- -- --    Lab 11/18/11 0545 11/17/11 0510  11/16/11 0400 11/15/11 0454 11/13/11 2151 11/13/11 1130 11/13/11 0630 11/12/11 2118 11/12/11 1038  NA 143 143 142 145 153* -- -- -- --  K 3.2* 3.6 3.0* 3.3* 4.6 -- -- -- --  CL 113* 113* 113* 116* 122* -- -- -- --  CO2 23 22 22 23  18* -- -- -- --  GLUCOSE 69* 68* 78 83 117* -- -- -- --  BUN 7 9 12 17  35* -- -- -- --  CREATININE 0.69 0.71 0.82 0.83 1.23 -- -- -- --  CALCIUM 8.6 8.6 8.2* 8.2* 9.8 -- -- -- --  MG -- -- 1.7 -- -- 2.7* 2.5 2.5 2.8*    Lab 11/12/11 1038  INR 1.12  PROTIME --   Cardiac markers: No results found for this basename: CK:3,CKMB:3,TROPONINI:3,MYOGLOBIN:3 in the last 168 hours No results found for this basename: POCBNP:3 in the last 168 hours Recent Results (from the past 240 hour(s))  CULTURE, BLOOD (ROUTINE X 2)     Status: Normal   Collection Time   11/12/11 11:00 AM      Component Value Range Status Comment   Specimen Description BLOOD RIGHT HAND   Final  Special Requests BOTTLES DRAWN AEROBIC ONLY [facility]   Final    Setup Time [id number]   Final    Culture NO GROWTH 5 DAYS   Final    Report Status 11/18/2011 FINAL   Final   URINE CULTURE     Status: Normal   Collection Time   11/12/11 12:04 PM      Component Value Range Status Comment   Specimen Description URINE, CATHETERIZED   Final    Special Requests NONE   Final    Setup Time [id number]   Final    Colony Count >=100,000 COLONIES/ML   Final    Culture PROTEUS MIRABILIS   Final    Report Status 11/14/2011 FINAL   Final    Organism ID, Bacteria PROTEUS MIRABILIS   Final   URINE CULTURE     Status: Normal   Collection Time   11/12/11 12:04 PM      Component Value Range Status Comment   Specimen Description URINE, CATHETERIZED   Final    Special Requests ADDED ON [id number] @2105    Final    Setup Time [id number]   Final    Colony Count >=100,000 COLONIES/ML   Final    Culture PROTEUS MIRABILIS   Final    Report Status 11/14/2011 FINAL   Final    Organism ID, Bacteria PROTEUS MIRABILIS   Final    MRSA PCR SCREENING     Status: Normal   Collection Time   11/12/11  8:11 PM      Component Value Range Status Comment   MRSA by PCR NEGATIVE  NEGATIVE  Final   CULTURE, BLOOD (ROUTINE X 2)     Status: Normal (Preliminary result)   Collection Time   11/12/11  8:49 PM      Component Value Range Status Comment   Specimen Description BLOOD LEFT HAND   Final    Special Requests BOTTLES DRAWN AEROBIC AND ANAEROBIC 10CC EACH   Final    Setup Time [id number]   Final    Culture     Final    Value:        BLOOD CULTURE RECEIVED NO GROWTH TO DATE CULTURE WILL BE HELD FOR 5 DAYS BEFORE ISSUING A FINAL NEGATIVE REPORT   Report Status PENDING   Incomplete   CULTURE, BLOOD (ROUTINE X 2)     Status: Normal (Preliminary result)   Collection Time   11/12/11  8:58 PM      Component Value Range Status Comment   Specimen Description BLOOD LEFT THUMB   Final    Special Requests BOTTLES DRAWN AEROBIC AND ANAEROBIC 10CC EACH   Final    Setup Time [id number]   Final    Culture     Final    Value:        BLOOD CULTURE RECEIVED NO GROWTH TO DATE CULTURE WILL BE HELD FOR 5 DAYS BEFORE ISSUING A FINAL NEGATIVE REPORT   Report Status PENDING   Incomplete     Radiology Reports Ct Head Wo Contrast  11/12/2011  *RADIOLOGY REPORT*  Clinical Data: Fatigue  CT HEAD WITHOUT CONTRAST  Technique:  Contiguous axial images were obtained from the base of the skull through the vertex without contrast.  Comparison: Head CT 10/04/2011  Findings: No acute intracranial hemorrhage.  No focal mass lesion. No CT evidence of acute infarction.   No midline shift or mass effect.  No hydrocephalus.  Basilar cisterns are patent. Age advanced atrophy is similar to prior. Paranasal  sinuses and mastoid air cells are clear.  Orbits are normal.  IMPRESSION: No acute intracranial findings.  No change from prior.  Original Report Authenticated By: Genevive Bi, M.D.   Ir US Guide Vasc Access Left  11/15/2011  * RADIOLOGY REPORT *   Clinical data: Renal failure, UTI, needs access for antibiotics  PICC PLACEMENT WITH ULTRASOUND AND FLUOROSCOPY  Technique: After written informed consent was obtained, patient was placed in the supine position on angiographic table. Patency of the left brachial vein was confirmed with ultrasound with image documentation. An appropriate skin site was determined. Skin site was marked. Region was prepped using maximum barrier technique including cap and mask, sterile gown, sterile gloves, large sterile sheet, and Chlorhexidine   as cutaneous antisepsis.  The region was infiltrated locally with 1% lidocaine.   Under real-time ultrasound guidance, the left brachial vein was accessed with a 21 gauge micropuncture needle; the needle tip within the vein was confirmed with ultrasound image documentation.   Needle exchanged over a 018 guidewire for a peel-away sheath, through which a 5-French dual- lumen power injectable PICC trimmed to 43cm was advanced, positioned with its tip near the cavoatrial junction.  Spot chest radiograph confirms appropriate catheter position.  Catheter was flushed per protocol and secured externally with 0-Prolene sutures. The patient tolerated procedure well, with no immediate complication.  IMPRESSION: Technically successful five [place] dual  lumen power injectable PICC placement  Original Report Authenticated By: Osa Craver, M.D.   Dg Chest Portable 1 View  11/12/2011  *RADIOLOGY REPORT*  Clinical Data: Addison's disease.  Tachycardia.  Weakness.  PORTABLE CHEST - 1 VIEW  Comparison: 10/07/2011  Findings: Low lung volumes as seen however both lungs are clear. Heart size is within normal limits.  No evidence of pleural effusion.  IMPRESSION: Low lung volumes.  No active disease.  Original Report Authenticated By: Danae Orleans, M.D.   Ir Fluoro Guide Cv Midline Picc Left  11/15/2011  * RADIOLOGY REPORT *  Clinical data: Renal failure, UTI, needs access for antibiotics  PICC  PLACEMENT WITH ULTRASOUND AND FLUOROSCOPY  Technique: After written informed consent was obtained, patient was placed in the supine position on angiographic table. Patency of the left brachial vein was confirmed with ultrasound with image documentation. An appropriate skin site was determined. Skin site was marked. Region was prepped using maximum barrier technique including cap and mask, sterile gown, sterile gloves, large sterile sheet, and Chlorhexidine   as cutaneous antisepsis.  The region was infiltrated locally with 1% lidocaine.   Under real-time ultrasound guidance, the left brachial vein was accessed with a 21 gauge micropuncture needle; the needle tip within the vein was confirmed with ultrasound image documentation.   Needle exchanged over a 018 guidewire for a peel-away sheath, through which a 5-French dual- lumen power injectable PICC trimmed to 43cm was advanced, positioned with its tip near the cavoatrial junction.  Spot chest radiograph confirms appropriate catheter position.  Catheter was flushed per protocol and secured externally with 0-Prolene sutures. The patient tolerated procedure well, with no immediate complication.  IMPRESSION: Technically successful five [place] dual  lumen power injectable PICC placement  Original Report Authenticated By: Thora Lance III, M.D.   Scheduled Meds:   . antiseptic oral rinse  15 mL Mouth Rinse q12n4p  . cefTRIAXone (ROCEPHIN) IV  1 g Intravenous Q24H  . chlorhexidine  15 mL Mouth Rinse BID  . feeding supplement  1 Container Oral QID  . feeding supplement  30 mL Oral BID  . feeding supplement  1 Container Oral TID BM  . magnesium sulfate IVPB  1 g Intravenous Once  . metoprolol tartrate  12.5 mg Oral BID  . pantoprazole  40 mg Oral Q1200  . potassium chloride  10 mEq Intravenous Q1 Hr x 4  . potassium chloride  40 mEq Oral TID  . DISCONTD: free water  100 mL Per Tube Q6H  . DISCONTD: potassium chloride  40 mEq Oral TID   Continuous  Infusions:   . dextrose    . DISCONTD: dextrose 50 mL/hr at 11/17/11 2132   PRN Meds:.acetaminophen, sodium chloride  Assessment & Plan   Subjective: Awake and alert, smiling, no issues per RN   Objective: Weight change: -2 kg (-4 lb 6.6 oz) Intake/Output Summary (Last 24 hours) at 11/17/11 1330 Last data filed at 11/17/11 1222   Gross per 24 hour   Intake     140 ml   Output    1700 ml   Net   -1560 ml    Blood pressure 115/83, pulse 75, temperature 97.4 F (36.3 C), temperature source Oral, resp. rate 16, height 5\' 9"  (1.753 m), weight 107.2 kg (236 lb 5.3 oz), SpO2 99.00%.   Physical Exam: General: Alert and awake, not in any acute distress. HEENT: anicteric sclera, pupils reactive to light and accommodation, EOMI CVS: S1-S2 clear, no murmur rubs or gallops Chest: clear to auscultation bilaterally, no wheezing, rales or rhonchi Abdomen: soft nontender, nondistended, normal bowel sounds, no organomegaly Extremities: no cyanosis, clubbing or edema noted bilaterally, contractures     Lab Results: Basic Metabolic Panel:    Lab  11/17/11 0510  11/16/11 0400  11/13/11 1130   NA  143  142  --   K  3.6  3.0*  --   CL  113*  113*  --   CO2  22  22  --   GLUCOSE  68*  78  --   BUN  9  12  --   CREATININE  0.71  0.82  --   CALCIUM  8.6  8.2*  --   MG  --  1.7  --   PHOS  --  --  4.5      Liver Function Tests:    Lab  11/12/11 1038   AST  27   ALT  26   ALKPHOS  85   BILITOT  1.3*   PROT  8.6*   ALBUMIN  3.9      CBC:    Lab  11/17/11 0510  11/16/11 0400  11/12/11 1038   WBC  4.2  3.8*  --   NEUTROABS  --  --  4.5   HGB  12.2*  11.2*  --   HCT  35.3*  34.5*  --   MCV  85.7  87.1  --   PLT  52*  46*  --      CBG:    Lab  11/17/11 0710  11/14/11 2350  11/13/11 0038  11/12/11 2008  11/12/11 1125   GLUCAP  70  89  86  81  100*          Micro Results: Recent Results (from the past 240 hour(s))   CULTURE, BLOOD (ROUTINE X 2)     Status:  Normal (Preliminary result)     Collection Time     11/12/11 11:00 AM       Component  Value  Range  Status  Comment     Specimen Description  BLOOD RIGHT HAND     Final       Special Requests  BOTTLES DRAWN AEROBIC ONLY 6CC     Final       Setup Time  [id number]     Final       Culture        Final       Value:         BLOOD CULTURE RECEIVED NO GROWTH TO DATE CULTURE WILL BE HELD FOR 5 DAYS BEFORE ISSUING A FINAL NEGATIVE REPORT     Report Status  PENDING     Incomplete     URINE CULTURE     Status: Normal     Collection Time     11/12/11 12:04 PM       Component  Value  Range  Status  Comment     Specimen Description  URINE, CATHETERIZED     Final       Special Requests  NONE     Final       Setup Time  [id number]     Final       Colony Count  >=100,000 COLONIES/ML     Final       Culture  PROTEUS MIRABILIS     Final       Report Status  11/14/2011 FINAL     Final       Organism ID, Bacteria  PROTEUS MIRABILIS     Final     URINE CULTURE     Status: Normal     Collection Time     11/12/11 12:04 PM       Component  Value  Range  Status  Comment     Specimen Description  URINE, CATHETERIZED     Final       Special Requests  ADDED ON [id number] @2105      Final       Setup Time  [id number]     Final       Colony Count  >=100,000 COLONIES/ML     Final       Culture  PROTEUS MIRABILIS     Final       Report Status  11/14/2011 FINAL     Final       Organism ID, Bacteria  PROTEUS MIRABILIS     Final     MRSA PCR SCREENING     Status: Normal     Collection Time     11/12/11  8:11 PM       Component  Value  Range  Status  Comment     MRSA by PCR  NEGATIVE   NEGATIVE   Final     CULTURE, BLOOD (ROUTINE X 2)     Status: Normal (Preliminary result)     Collection Time     11/12/11  8:49 PM       Component  Value  Range  Status  Comment     Specimen Description  BLOOD LEFT HAND     Final       Special Requests  BOTTLES DRAWN AEROBIC AND ANAEROBIC [facility]     Final       Setup Time   [id number]     Final       Culture        Final       Value:         BLOOD CULTURE RECEIVED  NO GROWTH TO DATE CULTURE WILL BE HELD FOR 5 DAYS BEFORE ISSUING A FINAL NEGATIVE REPORT     Report Status  PENDING     Incomplete     CULTURE, BLOOD (ROUTINE X 2)     Status: Normal (Preliminary result)     Collection Time     11/12/11  8:58 PM       Component  Value  Range  Status  Comment     Specimen Description  BLOOD LEFT THUMB     Final       Special Requests  BOTTLES DRAWN AEROBIC AND ANAEROBIC 10CC EACH     Final       Setup Time  [id number]     Final       Culture        Final       Value:         BLOOD CULTURE RECEIVED NO GROWTH TO DATE CULTURE WILL BE HELD FOR 5 DAYS BEFORE ISSUING A FINAL NEGATIVE REPORT     Report Status  PENDING     Incomplete        Studies/Results: Ct Head Wo Contrast   11/12/2011  *RADIOLOGY REPORT*  Clinical Data: Fatigue  CT HEAD WITHOUT CONTRAST  Technique:  Contiguous axial images were obtained from the base of the skull through the vertex without contrast.  Comparison: Head CT 10/04/2011  Findings: No acute intracranial hemorrhage.  No focal mass lesion. No CT evidence of acute infarction.   No midline shift or mass effect.  No hydrocephalus.  Basilar cisterns are patent. Age advanced atrophy is similar to prior. Paranasal sinuses and mastoid air cells are clear.  Orbits are normal.  IMPRESSION: No acute intracranial findings.  No change from prior.  Original Report Authenticated By: Genevive Bi, M.D.    Ir US Guide Vasc Access Left   11/15/2011  * RADIOLOGY REPORT *  Clinical data: Renal failure, UTI, needs access for antibiotics  PICC PLACEMENT WITH ULTRASOUND AND FLUOROSCOPY  Technique: After written informed consent was obtained, patient was placed in the supine position on angiographic table. Patency of the left brachial vein was confirmed with ultrasound with image documentation. An appropriate skin site was determined. Skin site was marked.  Region was prepped using maximum barrier technique including cap and mask, sterile gown, sterile gloves, large sterile sheet, and Chlorhexidine   as cutaneous antisepsis.  The region was infiltrated locally with 1% lidocaine.   Under real-time ultrasound guidance, the left brachial vein was accessed with a 21 gauge micropuncture needle; the needle tip within the vein was confirmed with ultrasound image documentation.   Needle exchanged over a 018 guidewire for a peel-away sheath, through which a 5-French dual- lumen power injectable PICC trimmed to 43cm was advanced, positioned with its tip near the cavoatrial junction.  Spot chest radiograph confirms appropriate catheter position.  Catheter was flushed per protocol and secured externally with 0-Prolene sutures. The patient tolerated procedure well, with no immediate complication.  IMPRESSION: Technically successful five [place] dual  lumen power injectable PICC placement  Original Report Authenticated By: Osa Craver, M.D.    Dg Chest Portable 1 View   11/12/2011  *RADIOLOGY REPORT*  Clinical Data: Addison's disease.  Tachycardia.  Weakness.  PORTABLE CHEST - 1 VIEW  Comparison: 10/07/2011  Findings: Low lung volumes as seen however both lungs are clear. Heart size is within normal limits.  No evidence of pleural effusion.  IMPRESSION: Low lung volumes.  No active disease.  Original Report Authenticated By: Danae Orleans, M.D.    Ir Fluoro Guide Cv Midline Picc Left   11/15/2011  * RADIOLOGY REPORT *  Clinical data: Renal failure, UTI, needs access for antibiotics  PICC PLACEMENT WITH ULTRASOUND AND FLUOROSCOPY  Technique: After written informed consent was obtained, patient was placed in the supine position on angiographic table. Patency of the left brachial vein was confirmed with ultrasound with image documentation. An appropriate skin site was determined. Skin site was marked. Region was prepped using maximum barrier technique including cap  and mask, sterile gown, sterile gloves, large sterile sheet, and Chlorhexidine   as cutaneous antisepsis.  The region was infiltrated locally with 1% lidocaine.   Under real-time ultrasound guidance, the left brachial vein was accessed with a 21 gauge micropuncture needle; the needle tip within the vein was confirmed with ultrasound image documentation.   Needle exchanged over a 018 guidewire for a peel-away sheath, through which a 5-French dual- lumen power injectable PICC trimmed to 43cm was advanced, positioned with its tip near the cavoatrial junction.  Spot chest radiograph confirms appropriate catheter position.  Catheter was flushed per protocol and secured externally with 0-Prolene sutures. The patient tolerated procedure well, with no immediate complication.  IMPRESSION: Technically successful five [place] dual  lumen power injectable PICC placement  Original Report Authenticated By: Osa Craver, M.D.       Medications: Scheduled Meds:     .  antiseptic oral rinse   15 mL  Mouth Rinse  q12n4p   .  cefTRIAXone (ROCEPHIN) IV   1 g  Intravenous  Q24H   .  chlorhexidine   15 mL  Mouth Rinse  BID   .  feeding supplement   30 mL  Oral  BID   .  metoprolol tartrate   12.5 mg  Oral  BID   .  pantoprazole   40 mg  Oral  Q1200   .  potassium chloride   40 mEq  Oral  TID   .  sodium chloride          .  DISCONTD: feeding supplement   30 mL  Per Tube  BID   .  DISCONTD: imipenem-cilastatin   500 mg  Intravenous  Q6H   .  DISCONTD: metoprolol   5 mg  Intravenous  Q6H   .  DISCONTD: pantoprazole (PROTONIX) IV   40 mg  Intravenous  Q24H   .  DISCONTD: potassium chloride   20 mEq  Oral  BID    Continuous Infusions:     .  dextrose  50 mL/hr at 11/17/11 1200   .  DISCONTD: dextrose  75 mL/hr at 11/16/11 1300    PRN Meds:.acetaminophen   Assessment/Plan: Altered mental status: Likely related to hypernatremia on top of a baseline of fluctuating mental status due to anoxic brain injury,  will get EEG.   Dysphagia   hx dysphagia-previously had PEG, has since been d/c-family reports that pt eats with assist, full asp precautions, refused this AM, gentle IVF. Check CXR R/O aspiration.  Anoxic brain injury   Due to cardiac arrest at [facility] in a SNF , monitor   Hypernatremia: Improved   In the setting of dehydration-much improved with free water via IV-with continued improvement in his sodium, continue low maintenance IV fluids until eating better   Acute renal failure: Resolved   Unknown baseline crt-creatinine/renal function has normalized    Protein calorie malnutrition  Continue current diet and assist with all meals    Hyperchloremia: Improving    Hyperthyroidism   Tapazole has recently been increased due to markedly low TSH-will need to recheck a TSH in 4-6 week    Proteus mirabilis UTI (lower urinary tract infection)/history of MDR infection -   Continue Rocephin per current sensitivities, stable   Thrombocytopenia   plts are trending upwards today, no evidence of bleeding continue to monitor closely      DVT Prophylaxis  Lovenox  SCDs  See all Orders from today for further details

## 2011-11-18 NOTE — Progress Notes (Signed)
Physical Therapy Treatment Patient Details Name: [name] MRN: [id number] DOB: Oct 19, 1966 Today's Date: 11/18/2011  PT Assessment/Plan  PT - Assessment/Plan Comments on Treatment Session: PT session somewhat limited today as pt not very participatory with treatment today with continued attempts to return to bed. Pt able to contribute to mobility well when he decides to do so. Will continue to work with pt to decrease burden of care and increase safety.  PT Plan: Discharge plan remains appropriate PT Frequency: Min 3X/week Follow Up Recommendations: Other (comment);Home health PT;24 hour supervision/assistance (Return to group home.) Equipment Recommended: None recommended by PT;Other (comment) (May benefit from RW.  Unsure if pt owns one.) PT Goals  Acute Rehab PT Goals PT Goal Formulation: With patient Time For Goal Achievement: 7 days Pt will go Supine/Side to Sit: with supervision PT Goal: Supine/Side to Sit - Progress: Progressing toward goal Pt will Transfer Sit to Stand/Stand to Sit: with supervision PT Transfer Goal: Sit to Stand/Stand to Sit - Progress: Progressing toward goal Pt will Ambulate: 51 - 150 feet;with min assist;with least restrictive assistive device PT Goal: Ambulate - Progress: Progressing toward goal  PT Treatment Precautions/Restrictions  Precautions Precautions: Fall Required Braces or Orthoses: No Restrictions Weight Bearing Restrictions: No Mobility (including Balance) Bed Mobility Bed Mobility: Yes Rolling Right: 1: +2 Total assist;Patient percentage (comment) (+2 total assist (pt=50%)) Rolling Right Details (indicate cue type and reason): Verbal/tactile cues for sequence, encouragement for contribution Rolling Left: 1: +2 Total assist;Patient percentage (comment) (+2 total assist (pt=50%)) Rolling Left Details (indicate cue type and reason): Verbal/tactile cues for sequence, encouragement for contribution Right Sidelying to Sit:  (+2 total  assist (pt=50%); 2 trials.) Left Sidelying to Sit: 1: +2 Total assist;Patient percentage (comment) (+ 2 assist (pt= 30%), HOB = 25 degrees) Left Sidelying to Sit Details (indicate cue type and reason): assist at trunk to transfer to midline.  Sitting - Scoot to Edge of Bed: 2: Max assist Sitting - Scoot to [place] of Bed Details (indicate cue type and reason): PT to use pad to advance pelvis.  Sit to Supine - Right:  (+2 total assist (pt=50%)) Sit to Supine - Left: 1: +2 Total assist;Patient percentage (comment);HOB flat ((pt= 60%)) Sit to Supine - Left Details (indicate cue type and reason): Verbal cues for safety and positioning as pt impulsively laying down without regard to head in relation to railing.  Transfers Transfers: Yes Sit to Stand: 1: +2 Total assist;Patient percentage (comment);From bed;Other (comment) (+ 2 total assist (pt=60%)) Sit to Stand Details (indicate cue type and reason): Verbal cues for initiation, assist for anterior weightshift and safety Stand to Sit: 1: +2 Total assist;Patient percentage (comment);To bed;Other (comment) (+2 total assist (pt=60%)) Stand to Sit Details: Verbal cues for safe positioning, assist secondary to impulsivity.  Ambulation/Gait Ambulation/Gait: Yes Ambulation/Gait Assistance: 1: +2 Total assist;Patient percentage (comment) (+2 total assist (pt=50%)) Ambulation/Gait Assistance Details (indicate cue type and reason): Verbal cues to continue gait however pt impulsively sat down. Assist for lateral weight shift.  Ambulation Distance (Feet): 3 Feet Assistive device: 2 person hand held assist Gait Pattern: Decreased step length - right;Decreased step length - left;Decreased weight shift to right;Decreased weight shift to left;Trunk flexed Stairs: No Wheelchair Mobility Wheelchair Mobility: No  Posture/Postural Control Posture/Postural Control: No significant limitations Balance Balance Assessed: Yes Static Sitting Balance Static Sitting -  Balance Support: Bilateral upper extremity supported;Feet supported Static Sitting - Level of Assistance: 4: Min assist;3: Mod assist Static Sitting - Comment/# of Minutes: Min to [employer]  assist at times as pt attempting to go back to supine. Pt EOB ~ 8-10 min Dynamic Sitting Balance Dynamic Sitting - Balance Support: Right upper extremity supported;Left upper extremity supported;Feet supported Dynamic Sitting - Level of Assistance: 4: Min assist Dynamic Sitting - Balance Activities: Forward lean/weight shifting;Reaching for objects;Reaching across midline Dynamic Sitting - Comments: Pt with decreased contribution secondary to decreased interest in activity.  Exercise  Other Exercises Other Exercises: Attempted long arc quads however pt contributing <20% to exercise End of Session PT - End of Session Equipment Utilized During Treatment: Gait belt Activity Tolerance: Patient tolerated treatment well Patient left: in bed;with call bell in reach;with bed alarm set Nurse Communication: Mobility status for transfers;Mobility status for ambulation General Behavior During Session: Flat affect Cognition: Impaired, at baseline  Sherie Don 11/18/2011, 12:26 PM  Sherie Don) Carleene Mains PT, DPT Acute Rehabilitation [phone]

## 2011-11-19 LAB — CBC
HCT: 37.4 % — ABNORMAL LOW (ref 39.0–52.0)
MCH: 29.5 pg (ref 26.0–34.0)
MCV: 84.2 fL (ref 78.0–100.0)
Platelets: 68 10*3/uL — ABNORMAL LOW (ref 150–400)
RBC: 4.44 MIL/uL (ref 4.22–5.81)
WBC: 4.4 10*3/uL (ref 4.0–10.5)

## 2011-11-19 LAB — BASIC METABOLIC PANEL
CO2: 19 mEq/L (ref 19–32)
Calcium: 8.7 mg/dL (ref 8.4–10.5)
Chloride: 110 mEq/L (ref 96–112)
Glucose, Bld: 76 mg/dL (ref 70–99)
Sodium: 139 mEq/L (ref 135–145)

## 2011-11-19 LAB — CULTURE, BLOOD (ROUTINE X 2)
Culture  Setup Time: 201211160314
Culture  Setup Time: 201211160315
Culture: NO GROWTH

## 2011-11-19 MED ORDER — ALTEPLASE 2 MG IJ SOLR
2.0000 mg | Freq: Once | INTRAMUSCULAR | Status: AC
  Administered 2011-11-19: 2 mg
  Filled 2011-11-19: qty 2

## 2011-11-19 MED ORDER — POTASSIUM CHLORIDE CRYS ER 20 MEQ PO TBCR
40.0000 meq | EXTENDED_RELEASE_TABLET | Freq: Once | ORAL | Status: AC
  Administered 2011-11-19: 40 meq via ORAL

## 2011-11-19 MED ORDER — DEXTROSE-NACL 5-0.45 % IV SOLN
INTRAVENOUS | Status: DC
  Administered 2011-11-19 – 2011-11-20 (×2): via INTRAVENOUS

## 2011-11-19 NOTE — Progress Notes (Signed)
[name] CSN:[id number],MRN:[id number] is a 45 y.o. male,  Outpatient Primary MD for the patient is [name], MD Chief Complaint  Patient presents with  . Fatigue      11/19/2011   Subjective:   [name] today has, No headache, No chest pain, No abdominal pain - No Nausea, No new weakness tingling or numbness, No Cough - SOB.  Answered Qs today with head nods.  Objective:   Filed Vitals:   11/18/11 2135 11/18/11 2156 11/19/11 0500 11/19/11 0525  BP: 111/74 111/74  115/73  Pulse: 64 65  62  Temp: 97.1 F (36.2 C)   97.3 F (36.3 C)  TempSrc: Oral   Oral  Resp: 20   20  Height:      Weight:   104.781 kg (231 lb)   SpO2: 100%   96%   Wt Readings from Last 3 Encounters:  11/19/11 104.781 kg (231 lb)    Intake/Output Summary (Last 24 hours) at 11/19/11 1143 Last data filed at 11/19/11 0500  Gross per 24 hour  Intake    120 ml  Output    615 ml  Net   -495 ml   Exam Awake  , No new F.N deficits, Normal affect, moving all 4 extremities and answering Qs today, improved [place].AT,PERRAL Supple Neck,No JVD, No cervical lymphadenopathy appriciated.  Symmetrical Chest wall movement, Good air movement bilaterally, CTAB RRR,No Gallops,Rubs or new Murmurs, No Parasternal Heave +ve B.Sounds, Abd Soft, Non tender, No organomegaly appriciated, No rebound -guarding or rigidity. No Cyanosis, Clubbing or edema, No new Rash or bruise   Data Review  CBC  Lab 11/19/11 0845 11/18/11 0545 11/17/11 0510 11/16/11 0400 11/15/11 0454  WBC 4.4 4.2 4.2 3.8* 4.0  HGB 13.1 12.2* 12.2* 11.2* 11.1*  HCT 37.4* 36.3* 35.3* 34.5* 35.2*  PLT 68* 56* 52* 46* 53*  MCV 84.2 86.4 85.7 87.1 90.7  MCH 29.5 29.0 29.6 28.3 28.6  MCHC 35.0 33.6 34.6 32.5 31.5  RDW 13.9 14.1 13.9 14.0 14.3  LYMPHSABS -- -- -- -- --  MONOABS -- -- -- -- --  EOSABS -- -- -- -- --  BASOSABS -- -- -- -- --  BANDABS -- -- -- -- --    Lab 11/19/11 0845 11/18/11 0545 11/17/11 0510 11/16/11 0400 11/15/11 0454  11/13/11 1130 11/13/11 0630 11/12/11 2118  NA 139 143 143 142 145 -- -- --  K 3.5 3.2* 3.6 3.0* 3.3* -- -- --  CL 110 113* 113* 113* 116* -- -- --  CO2 19 23 22 22 23  -- -- --  GLUCOSE 76 69* 68* 78 83 -- -- --  BUN 6 7 9 12 17  -- -- --  CREATININE 0.61 0.69 0.71 0.82 0.83 -- -- --  CALCIUM 8.7 8.6 8.6 8.2* 8.2* -- -- --  MG -- -- -- 1.7 -- 2.7* 2.5 2.5   No results found for this basename: INR:5,PROTIME:5 in the last 168 hours Cardiac markers: No results found for this basename: CK:3,CKMB:3,TROPONINI:3,MYOGLOBIN:3 in the last 168 hours No results found for this basename: POCBNP:3 in the last 168 hours Recent Results (from the past 240 hour(s))  CULTURE, BLOOD (ROUTINE X 2)     Status: Normal   Collection Time   11/12/11 11:00 AM      Component Value Range Status Comment   Specimen Description BLOOD RIGHT HAND   Final    Special Requests BOTTLES DRAWN AEROBIC ONLY [facility]   Final    Setup Time [id number]  Final    Culture NO GROWTH 5 DAYS   Final    Report Status 11/18/2011 FINAL   Final   URINE CULTURE     Status: Normal   Collection Time   11/12/11 12:04 PM      Component Value Range Status Comment   Specimen Description URINE, CATHETERIZED   Final    Special Requests NONE   Final    Setup Time [id number]   Final    Colony Count >=100,000 COLONIES/ML   Final    Culture PROTEUS MIRABILIS   Final    Report Status 11/14/2011 FINAL   Final    Organism ID, Bacteria PROTEUS MIRABILIS   Final   URINE CULTURE     Status: Normal   Collection Time   11/12/11 12:04 PM      Component Value Range Status Comment   Specimen Description URINE, CATHETERIZED   Final    Special Requests ADDED ON [id number] @2105    Final    Setup Time [id number]   Final    Colony Count >=100,000 COLONIES/ML   Final    Culture PROTEUS MIRABILIS   Final    Report Status 11/14/2011 FINAL   Final    Organism ID, Bacteria PROTEUS MIRABILIS   Final   MRSA PCR SCREENING     Status: Normal   Collection Time    11/12/11  8:11 PM      Component Value Range Status Comment   MRSA by PCR NEGATIVE  NEGATIVE  Final   CULTURE, BLOOD (ROUTINE X 2)     Status: Normal   Collection Time   11/12/11  8:49 PM      Component Value Range Status Comment   Specimen Description BLOOD LEFT HAND   Final    Special Requests BOTTLES DRAWN AEROBIC AND ANAEROBIC 10CC EACH   Final    Setup Time [id number]   Final    Culture NO GROWTH 5 DAYS   Final    Report Status 11/19/2011 FINAL   Final   CULTURE, BLOOD (ROUTINE X 2)     Status: Normal   Collection Time   11/12/11  8:58 PM      Component Value Range Status Comment   Specimen Description BLOOD LEFT THUMB   Final    Special Requests BOTTLES DRAWN AEROBIC AND ANAEROBIC 10CC EACH   Final    Setup Time [id number]   Final    Culture NO GROWTH 5 DAYS   Final    Report Status 11/19/2011 FINAL   Final     Radiology Reports Ct Head Wo Contrast  11/12/2011  *RADIOLOGY REPORT*  Clinical Data: Fatigue  CT HEAD WITHOUT CONTRAST  Technique:  Contiguous axial images were obtained from the base of the skull through the vertex without contrast.  Comparison: Head CT 10/04/2011  Findings: No acute intracranial hemorrhage.  No focal mass lesion. No CT evidence of acute infarction.   No midline shift or mass effect.  No hydrocephalus.  Basilar cisterns are patent. Age advanced atrophy is similar to prior. Paranasal sinuses and mastoid air cells are clear.  Orbits are normal.  IMPRESSION: No acute intracranial findings.  No change from prior.  Original Report Authenticated By: Genevive Bi, M.D.   Ir US Guide Vasc Access Left  11/15/2011  * RADIOLOGY REPORT *  Clinical data: Renal failure, UTI, needs access for antibiotics  PICC PLACEMENT WITH ULTRASOUND AND FLUOROSCOPY  Technique: After written informed consent was obtained, patient was placed in the supine  position on angiographic table. Patency of the left brachial vein was confirmed with ultrasound with image documentation. An  appropriate skin site was determined. Skin site was marked. Region was prepped using maximum barrier technique including cap and mask, sterile gown, sterile gloves, large sterile sheet, and Chlorhexidine   as cutaneous antisepsis.  The region was infiltrated locally with 1% lidocaine.   Under real-time ultrasound guidance, the left brachial vein was accessed with a 21 gauge micropuncture needle; the needle tip within the vein was confirmed with ultrasound image documentation.   Needle exchanged over a 018 guidewire for a peel-away sheath, through which a 5-French dual- lumen power injectable PICC trimmed to 43cm was advanced, positioned with its tip near the cavoatrial junction.  Spot chest radiograph confirms appropriate catheter position.  Catheter was flushed per protocol and secured externally with 0-Prolene sutures. The patient tolerated procedure well, with no immediate complication.  IMPRESSION: Technically successful five [place] dual  lumen power injectable PICC placement  Original Report Authenticated By: Osa Craver, M.D.   Dg Chest Port 1 View  11/18/2011  *RADIOLOGY REPORT*  Clinical Data: Cough  PORTABLE CHEST - 1 VIEW  Comparison: 11/12/2011  Findings: Heart size is within normal limits.  Negative for heart failure or pneumonia.  Lungs are clear.  Left arm PICC tip in the SVC  IMPRESSION: PICC tip in the SVC.  No acute cardiopulmonary disease.  Original Report Authenticated By: Camelia Phenes, M.D.   Dg Chest Portable 1 View  11/12/2011  *RADIOLOGY REPORT*  Clinical Data: Addison's disease.  Tachycardia.  Weakness.  PORTABLE CHEST - 1 VIEW  Comparison: 10/07/2011  Findings: Low lung volumes as seen however both lungs are clear. Heart size is within normal limits.  No evidence of pleural effusion.  IMPRESSION: Low lung volumes.  No active disease.  Original Report Authenticated By: Danae Orleans, M.D.   Ir Fluoro Guide Cv Midline Picc Left  11/15/2011  * RADIOLOGY REPORT *   Clinical data: Renal failure, UTI, needs access for antibiotics  PICC PLACEMENT WITH ULTRASOUND AND FLUOROSCOPY  Technique: After written informed consent was obtained, patient was placed in the supine position on angiographic table. Patency of the left brachial vein was confirmed with ultrasound with image documentation. An appropriate skin site was determined. Skin site was marked. Region was prepped using maximum barrier technique including cap and mask, sterile gown, sterile gloves, large sterile sheet, and Chlorhexidine   as cutaneous antisepsis.  The region was infiltrated locally with 1% lidocaine.   Under real-time ultrasound guidance, the left brachial vein was accessed with a 21 gauge micropuncture needle; the needle tip within the vein was confirmed with ultrasound image documentation.   Needle exchanged over a 018 guidewire for a peel-away sheath, through which a 5-French dual- lumen power injectable PICC trimmed to 43cm was advanced, positioned with its tip near the cavoatrial junction.  Spot chest radiograph confirms appropriate catheter position.  Catheter was flushed per protocol and secured externally with 0-Prolene sutures. The patient tolerated procedure well, with no immediate complication.  IMPRESSION: Technically successful five [place] dual  lumen power injectable PICC placement  Original Report Authenticated By: Thora Lance III, M.D.   Scheduled Meds:   . alteplase  2 mg Intracatheter Once  . antiseptic oral rinse  15 mL Mouth Rinse q12n4p  . cefTRIAXone (ROCEPHIN) IV  1 g Intravenous Q24H  . chlorhexidine  15 mL Mouth Rinse BID  . feeding supplement  1 Container Oral QID  .  feeding supplement  30 mL Oral BID  . feeding supplement  1 Container Oral TID BM  . magnesium sulfate IVPB  1 g Intravenous Once  . metoprolol tartrate  12.5 mg Oral BID  . pantoprazole  40 mg Oral Q1200  . potassium chloride  10 mEq Intravenous Q1 Hr x 4  . potassium chloride  40 mEq Oral Once  .  DISCONTD: enoxaparin (LOVENOX) injection  40 mg Subcutaneous Q24H  . DISCONTD: free water  100 mL Per Tube Q6H  . DISCONTD: potassium chloride  40 mEq Oral TID  . DISCONTD: potassium chloride  40 mEq Oral TID   Continuous Infusions:   . dextrose 5 % and 0.45% NaCl    . DISCONTD: dextrose 50 mL/hr at 11/17/11 2132  . DISCONTD: dextrose 50 mL/hr at 11/19/11 0240   PRN Meds:.acetaminophen, sodium chloride  Assessment & Plan   Altered mental status: Likely related to hypernatremia on top of a baseline of fluctuating mental status due to anoxic brain injury, will get EEG pending.   Dysphagia   hx dysphagia-previously had PEG, has since been d/c-family reports that pt eats with assist, full asp precautions, CXR stable, speech to see, eating some now, no cough when eats.  Anoxic brain injury   Due to cardiac arrest at [facility] in a SNF , monitor   Hypernatremia: Improved   In the setting of dehydration-much improved with free water via IV-with continued improvement in his sodium, continue low maintenance IV fluids change too D5-1/2NS until eating better.   Acute renal failure: Resolved   Unknown baseline crt-creatinine/renal function has normalized    Protein calorie malnutrition   Continue current diet and assist with all meals    Hyperchloremia: Improving    Hyperthyroidism   Tapazole has recently been increased due to markedly low TSH-will need to recheck a TSH in 4-6 week    Proteus mirabilis UTI (lower urinary tract infection)/history of MDR infection -   Continue Rocephin per current sensitivities, stable   Chr Thrombocytopenia   plts are trending upwards today, no evidence of bleeding continue to monitor closely, order HIT panel, Hold off Lovenox-Heparin, likely true baseline thrombocytopenia - was NML due to +++ dehydration. In the past has been in 80-100s(76mth ago)   Lab Results  Component Value Date   PLT 68* 11/19/2011      DVT Prophylaxis   SCDs  See all Orders from today for further details

## 2011-11-20 LAB — BASIC METABOLIC PANEL
Calcium: 8.5 mg/dL (ref 8.4–10.5)
GFR calc Af Amer: >90 mL/min (ref >90)
GFR calc non Af Amer: >90 mL/min (ref >90)
Glucose, Bld: 68 mg/dL — ABNORMAL LOW (ref 70–99)
Potassium: 3.4 mEq/L — ABNORMAL LOW (ref 3.5–5.1)
Sodium: 138 mEq/L (ref 135–145)

## 2011-11-20 LAB — CBC
Hemoglobin: 12 g/dL — ABNORMAL LOW (ref 13.0–17.0)
MCH: 28.8 pg (ref 26.0–34.0)
Platelets: 69 10*3/uL — ABNORMAL LOW (ref 150–400)
RBC: 4.17 MIL/uL — ABNORMAL LOW (ref 4.22–5.81)
WBC: 4.6 10*3/uL (ref 4.0–10.5)

## 2011-11-20 MED ORDER — MAGNESIUM SULFATE IN D5W 10-5 MG/ML-% IV SOLN
1.0000 g | Freq: Once | INTRAVENOUS | Status: AC
  Administered 2011-11-20: 1 g via INTRAVENOUS
  Filled 2011-11-20: qty 100

## 2011-11-20 MED ORDER — POTASSIUM CHLORIDE 10 MEQ/100ML IV SOLN
10.0000 meq | INTRAVENOUS | Status: AC
  Administered 2011-11-20 (×3): 10 meq via INTRAVENOUS
  Filled 2011-11-20 (×3): qty 100

## 2011-11-20 MED ORDER — METOPROLOL TARTRATE 12.5 MG HALF TABLET
12.5000 mg | ORAL_TABLET | Freq: Two times a day (BID) | ORAL | Status: DC
  Filled 2011-11-20: qty 1

## 2011-11-20 MED ORDER — METOPROLOL TARTRATE 25 MG PO TABS: 12.5000 mg | ORAL_TABLET | Freq: Two times a day (BID) | ORAL | Status: DC

## 2011-11-20 NOTE — Discharge Summary (Addendum)
[name], 45 y.o., DOB January 15, 1966, MRN [id number]. Admission date: 11/12/2011 Discharge Date 11/20/2011 Primary MD [name], MD Admitting Physician Iskra Magick-Myers  Admission Diagnosis  Hypernatremia [276.0] UTI (urinary tract infection) [599.0] Renal failure [586] Hypernatremia  Discharge Diagnosis   Active Problems:  Dysphagia  Anoxic brain injury  Hypernatremia  Acute renal failure  Protein calorie malnutrition  Hyperchloremia  Hyperthyroidism  UTI (lower urinary tract infection)    Past Medical History  Diagnosis Date  . Addison disease   . Anoxic brain injury   . A-fib   . Seizure disorder   . Dysphagia   . History of recurrent UTIs   . Hyperlipemia   . Aspiration pneumonia   . Mental disorder   . Anxiety     Past Surgical History  Procedure Date  . Nephrectomy unknown    Consults  PT,Speech  Significant Tests:  See full reports for all details     Ct Head Wo Contrast  11/12/2011  *RADIOLOGY REPORT*  Clinical Data: Fatigue  CT HEAD WITHOUT CONTRAST  Technique:  Contiguous axial images were obtained from the base of the skull through the vertex without contrast.  Comparison: Head CT 10/04/2011  Findings: No acute intracranial hemorrhage.  No focal mass lesion. No CT evidence of acute infarction.   No midline shift or mass effect.  No hydrocephalus.  Basilar cisterns are patent. Age advanced atrophy is similar to prior. Paranasal sinuses and mastoid air cells are clear.  Orbits are normal.  IMPRESSION: No acute intracranial findings.  No change from prior.  Original Report Authenticated By: Genevive Bi, M.D.   Ir US Guide Vasc Access Left  11/15/2011  * RADIOLOGY REPORT *  Clinical data: Renal failure, UTI, needs access for antibiotics  PICC PLACEMENT WITH ULTRASOUND AND FLUOROSCOPY  Technique: After written informed consent was obtained, patient was placed in the supine position on angiographic table. Patency of the left brachial vein was  confirmed with ultrasound with image documentation. An appropriate skin site was determined. Skin site was marked. Region was prepped using maximum barrier technique including cap and mask, sterile gown, sterile gloves, large sterile sheet, and Chlorhexidine   as cutaneous antisepsis.  The region was infiltrated locally with 1% lidocaine.   Under real-time ultrasound guidance, the left brachial vein was accessed with a 21 gauge micropuncture needle; the needle tip within the vein was confirmed with ultrasound image documentation.   Needle exchanged over a 018 guidewire for a peel-away sheath, through which a 5-French dual- lumen power injectable PICC trimmed to 43cm was advanced, positioned with its tip near the cavoatrial junction.  Spot chest radiograph confirms appropriate catheter position.  Catheter was flushed per protocol and secured externally with 0-Prolene sutures. The patient tolerated procedure well, with no immediate complication.  IMPRESSION: Technically successful five [place] dual  lumen power injectable PICC placement  Original Report Authenticated By: Osa Craver, M.D.   Dg Chest Port 1 View  11/18/2011  *RADIOLOGY REPORT*  Clinical Data: Cough  PORTABLE CHEST - 1 VIEW  Comparison: 11/12/2011  Findings: Heart size is within normal limits.  Negative for heart failure or pneumonia.  Lungs are clear.  Left arm PICC tip in the SVC  IMPRESSION: PICC tip in the SVC.  No acute cardiopulmonary disease.  Original Report Authenticated By: Camelia Phenes, M.D.   Dg Chest Portable 1 View  11/12/2011  *RADIOLOGY REPORT*  Clinical Data: Addison's disease.  Tachycardia.  Weakness.  PORTABLE CHEST - 1 VIEW  Comparison: 10/07/2011  Findings: Low lung volumes as seen however both lungs are clear. Heart size is within normal limits.  No evidence of pleural effusion.  IMPRESSION: Low lung volumes.  No active disease.  Original Report Authenticated By: Danae Orleans, M.D.   Ir Fluoro Guide Cv Midline  Picc Left  11/15/2011  * RADIOLOGY REPORT *  Clinical data: Renal failure, UTI, needs access for antibiotics  PICC PLACEMENT WITH ULTRASOUND AND FLUOROSCOPY  Technique: After written informed consent was obtained, patient was placed in the supine position on angiographic table. Patency of the left brachial vein was confirmed with ultrasound with image documentation. An appropriate skin site was determined. Skin site was marked. Region was prepped using maximum barrier technique including cap and mask, sterile gown, sterile gloves, large sterile sheet, and Chlorhexidine   as cutaneous antisepsis.  The region was infiltrated locally with 1% lidocaine.   Under real-time ultrasound guidance, the left brachial vein was accessed with a 21 gauge micropuncture needle; the needle tip within the vein was confirmed with ultrasound image documentation.   Needle exchanged over a 018 guidewire for a peel-away sheath, through which a 5-French dual- lumen power injectable PICC trimmed to 43cm was advanced, positioned with its tip near the cavoatrial junction.  Spot chest radiograph confirms appropriate catheter position.  Catheter was flushed per protocol and secured externally with 0-Prolene sutures. The patient tolerated procedure well, with no immediate complication.  IMPRESSION: Technically successful five [place] dual  lumen power injectable PICC placement  Original Report Authenticated By: Osa Craver, M.D.    Hospital Course See H&P, Labs, Consult and Test reports for all details in brief, patient was admitted for   Altered mental status: with H/O Anoxic Brain Injury in the past - Likely related to hypernatremia on top of a baseline of fluctuating mental status due to anoxic brain injury due to severe dehydration - now Sodium NML range after IVF, Mentation close to baseline, following all commands, No subjective complains, still not eating well, so may need close monitoring of PO intake on Dysphagia 2  diet-Honey thick Liquids with Aspiration precautions and close BMP monitor,  If dehydration reoccurs and PO intake remains an issue consider PEG. Note PICC to be removed in 3-4 days. Called and updated mother today.  H/O Dysphagia - hx dysphagia-previously had PEG, has since been d/c-family reports that pt eats with assist, full asp precautions, on on Dysphagia 2 diet-Honey thick Liquids, CXR stable, speech to see pt again at SNF, eating some now, no cough when eats.    Anoxic brain injury - Due to cardiac arrest at [facility] in a SNF , monitor , continue PT - OT as tolerated, His BP and H rate are little labile due to CNS injury and should evaluated clinically.   Acute renal failure: Resolved with IVF.  Hyperthyroidism - Tapazole has recently been increased due to markedly low TSH-will need to recheck a TSH outpatient in 3-4 days then per PCP.  Proteus mirabilis UTI - clinically resolved post IV Rocephin.  Chr Thrombocytopenia - close to baseline and improving- outpatient follow up as indicated. Have Ordered HIT Antibody panel as was acutely worse here - please follow results in 5 days.  Low K replaced IV today along with Magnesium IV   Things to follow  CBC, BMP, Magnesium, TSH 3 days. Remove PICC 3-4 days HIT Antibody Panel in 5 days.    Today   Subjective:   Shawne Eskelson today has no  headache,no chest abdominal pain,no new weakness tingling or numbness, feels much better wants to go home today.    Objective:   Blood pressure 108/76, pulse 71, temperature 97.1, temperature source Axillary, resp. rate 20, height 5\' 9"  (1.753 m), weight 104.5 kg (230 lb 6.1 oz), SpO2 95.00% RA.  Intake/Output Summary (Last 24 hours) at 11/20/11 1024 Last data filed at 11/19/11 1245  Gross per 24 hour  Intake    120 ml  Output      0 ml  Net    120 ml    Exam Awake Alert, following all commands,moving all 4 extremities, No new F.N deficits, Normal  affect [place].AT,PERRAL Supple Neck,No JVD, No cervical lymphadenopathy appriciated.  Symmetrical Chest wall movement, Good air movement bilaterally, CTAB RRR,No Gallops,Rubs or new Murmurs, No Parasternal Heave +ve B.Sounds, Abd Soft, Non tender, No organomegaly appriciated, No rebound -guarding or rigidity. No Cyanosis, Clubbing or edema, No new Rash or bruise  Data Review    CBC w Diff: Lab Results  Component Value Date   WBC 4.6 11/20/2011   HGB 12.0* 11/20/2011   HCT 35.6* 11/20/2011   PLT 69* 11/20/2011   LYMPHOPCT 36 11/12/2011   MONOPCT 10 11/12/2011   EOSPCT 2 11/12/2011   BASOPCT 0 11/12/2011   CMP: Lab Results  Component Value Date   NA 138 11/20/2011   K 3.4* 11/20/2011   CL 107 11/20/2011   CO2 23 11/20/2011   BUN 5* 11/20/2011   CREATININE 0.68 11/20/2011   PROT 8.6* 11/12/2011   ALBUMIN 3.9 11/12/2011   BILITOT 1.3* 11/12/2011   ALKPHOS 85 11/12/2011   AST 27 11/12/2011   ALT 26 11/12/2011  . Discharge Instructions     Follow with Primary MD [name], MD in 3 days , Get PICC Line taken out In 3 days.  Get CBC, BMP, Magnesium, TSH,checked 3 days by Primary MD and again as instructed by your Primary MD. Get a 2 view Chest X ray done next visit..  Get Medicines reviewed and adjusted.  Please request your Prim.MD to go over all Hospital Tests and Procedure/Radiological results at the follow up, please get all Hospital records sent to your Prim MD by signing hospital release before you go home.  Activity: Fall precautions use walker/cane & assistance as needed  Do not drive if your were admitted for syncope or siezures until you have seen by Primary MD or a Neurologist and advised to drive.  Do not drive when taking Pain medications.   Wear Seat belts while driving.  Diet: Dysphagia 2 with Honey Thick Liquids with Full assistance and  Aspiration precautions. Keep Head Of the Bed 90 degrees while eating and for 1 hr after.  For Heart failure  patients - Check your Weight same time everyday, if you gain over 2 pounds, or you develop in leg swelling, experience more shortness of breath or chest pain, call your Primary MD immediately. Follow Cardiac Low Salt Diet and 1.8 lit/day fluid restriction.  Disposition Home  If you experience worsening of your admission symptoms, develop shortness of breath, life threatening emergency, suicidal or homicidal thoughts you must seek medical attention immediately by calling 911 or calling your MD immediately  if symptoms less severe.  Do not take more than prescribed Pain, Sleep and Anxiety Medications  Special Instructions: If you have smoked or chewed Tobacco  in the last 2 yrs please stop smoking, stop any regular Alcohol  and or any Recreational drug use.  You Must  read complete instructions/literature along with all the possible adverse reactions/side effects for all the Medicines you take and that have been prescribed to you. Take any new Medicines after you have completely understood and accpet all the possible adverse reactions/side effects.    Discharge Medications   Lang, Zingg  Home Medication Instructions [id number]   Printed on:11/20/11 1024  Medication Information                    fish oil-omega-3 fatty acids 1000 MG capsule Take 1 g by mouth 2 (two) times daily.             pantoprazole (PROTONIX) 40 MG tablet Take 40 mg by mouth daily. USES PACKET, SO MUST CRUSH TABLETS            feeding supplement (PRO-STAT SUGAR FREE 64) LIQD Take 30 mLs by mouth 2 (two) times daily.             ferrous sulfate 325 (65 FE) MG tablet Take 325 mg by mouth 3 (three) times daily with meals.             methylcellulose (ARTIFICIAL TEARS) 1 % ophthalmic solution Place 1 drop into both eyes 4 (four) times daily.             DULoxetine (CYMBALTA) 60 MG capsule Take 60 mg by mouth daily.             ipratropium-albuterol (DUONEB) 0.5-2.5 (3) MG/3ML SOLN Take 3 mLs by nebulization  every 6 (six) hours as needed. SHORTNESS OF BREATH            metoprolol tartrate (LOPRESSOR) 25 MG tablet Take 12.5 mg by mouth 2 (two) times daily.   Hold for systolic blood pressure less than 110 or heart rate less than 60          mineral oil enema Place 1 enema rectally as needed. FOR CONSTIPATION            citalopram (CELEXA) 20 MG tablet Take 20 mg by mouth daily.             methimazole (TAPAZOLE) 5 MG tablet Take 5 mg by mouth daily.             Multiple Vitamins-Minerals (CERTA-VITE PO) Take 1 tablet by mouth daily.             docusate sodium (COLACE) 100 MG capsule Take 100 mg by mouth 2 (two) times daily.             polyethylene glycol (MIRALAX / GLYCOLAX) packet Take 17 g by mouth 2 (two) times daily.              Total Time in preparing paper work, data evaluation and todays exam - 35 minutes  Signed: Leroy Sea 11/20/2011 10:24 AM  The patient was admitted to the Hospitalist Service.

## 2011-11-20 NOTE — Plan of Care (Signed)
Problem: Phase II Progression Outcomes Goal: IV changed to normal saline lock Outcome: Not Applicable Date Met:  11/20/11 PICC line staying in per MD order

## 2011-11-20 NOTE — Plan of Care (Signed)
Problem: Discharge Progression Outcomes Goal: Tolerating diet Outcome: Progressing Variance: Patient/family refused or delayed decision Comments: Pt refuses to eat but very little during my time with him today

## 2011-11-20 NOTE — Progress Notes (Signed)
.  Clinical social worker assisted with patient discharge to skilled nursing facility. .Patient transportation provided by [employer] [facility] with patient chart copy. No further Clinical Social Work needs, signing off.    Catha Gosselin, Theresia Majors  [phone] .11/20/2011 11:06am

## 2011-11-20 NOTE — Plan of Care (Signed)
Problem: Phase III Progression Outcomes Goal: IV/normal saline lock discontinued Outcome: Not Applicable Date Met:  11/20/11 PICC line staying in according to MD order

## 2011-11-20 NOTE — Progress Notes (Signed)
Speech Language/Pathology Clinical/Bedside Swallow Evaluation Patient Details  Name: [name] MRN: [id number] DOB: 09/23/1966 Today's Date: 11/20/2011  Past Medical History:  Past Medical History  Diagnosis Date  . Addison disease   . Anoxic brain injury   . A-fib   . Seizure disorder   . Dysphagia   . History of recurrent UTIs   . Hyperlipemia   . Aspiration pneumonia   . Mental disorder   . Anxiety    Past Surgical History:  Past Surgical History  Procedure Date  . Nephrectomy unknown    Assessment/Recommendations/Treatment Plan    SLP Assessment Clinical Impression Statement: Limited exam today secondary to patient's refusal of bolus. Currently observed with pureed solids and honey thick liquids with evidence of a pharyngeal dysphagia but no s/s of aspiration. Per SNF records, patient previously on a ground diet with nectar thick liquids. Given inability to observe with nectar thick liquids today, will keep diet as is and f/u at bedside for nectar thick liquid trials. Doubtful that patient will need or participate in MBS. Will f/u. Risk for Aspiration: Moderate Other Related Risk Factors: History of dysphagia;Cognitive impairment   Per mom via phone, patient with allergic reaction to Haldol at SNF, and questions whether this is affecting swallowing function. Discussed with MD. Doubtful given that all other symptoms have resolved. Question behavior and cognition affecting decreased po intake at this time. Hopeful for improved intake once patient has returned to SNF with familiar staff and increased comfort.   Recommendations Solid Consistency: Dysphagia 2 (Fine chop) Liquid Consistency: Honey Liquid Administration via: Cup Medication Administration: Crushed with puree Supervision: Full supervision/cueing for compensatory strategies Compensations: Slow rate;Small sips/bites Postural Changes and/or Swallow Maneuvers: Seated upright 90 degrees Oral Care  Recommendations: Oral care BID Other Recommendations: Order thickener from pharmacy;Prohibited food (jello, ice cream, thin soups);Remove water pitcher   Swallow Study Goals  SLP Swallowing Goals Patient will consume recommended diet without observed clinical signs of aspiration with: Maximum assistance Swallow Study Goal #1 - Progress: Progressing toward goal Patient will utilize recommended strategies during swallow to increase swallowing safety with: Maximum assistance Swallow Study Goal #2 - Progress: Progressing toward goal   Ferdinand Lango MA, CCC-SLP [phone]    Patrcia Schnepp Meryl 11/20/2011,10:17 AM

## 2011-11-20 NOTE — Progress Notes (Signed)
ADP IS FOR SNF.  WILL F/U.   [name] 11/20/2011 [phone] OR [phone]

## 2011-11-21 NOTE — Procedures (Signed)
EEG NUMBER: [id number].  HISTORY:  A 45 year old man referred for rule out seizures.  MEDICATIONS:  No anticonvulsant medications.  CONDITION OF RECORDING:  This 16-channel EEG was recorded with the patient in the awake and drowsy states.    BACKGROUND RHYTHM: Background patterns in wakefulness were well-organized with the low-amplitude theta rhythm of 4-6 Hz, symmetrical, reactive to eye opening and closing.  Drowsiness was associated with mild attenuation of voltage and slowing of frequencies.    ABNORMAL POTENTIALS: No epileptiform activity or focal slowing was ordered.    ACTIVATION PROCEDURES: hyperventilation was not performed, photic stimulation did not activate tracing.    EKG: single-channel EKG monitor detected bradycardia.  IMPRESSION:  This is an abnormal awake and drowsy EEG due to presence of moderate diffuse background slowing.  This finding may be suggestive of moderate diffuse cerebral dysfunction and may be due to toxic-metabolic encephalopathy neurodegenerative disorder or bi-hemispheric structural abnormality.Clinical correlation is suggested.          ______________________________ Carmell Austria, MD    AV:[id number] D:  11/20/2011 12:44:47  T:  11/20/2011 13:44:24  Job #:  [id number]

## 2011-11-23 ENCOUNTER — Inpatient Hospital Stay (HOSPITAL_COMMUNITY)
Admission: EM | Admit: 2011-11-23 | Discharge: 2011-11-30 | DRG: 640 | Disposition: A | Discharge status: Skilled Nursing Facility | Payer: Medicare Other | Attending: Internal Medicine | Admitting: Internal Medicine

## 2011-11-23 ENCOUNTER — Emergency Department (HOSPITAL_COMMUNITY): Payer: Medicare Other

## 2011-11-23 ENCOUNTER — Encounter (HOSPITAL_COMMUNITY): Payer: Self-pay | Admitting: Emergency Medicine

## 2011-11-23 ENCOUNTER — Other Ambulatory Visit: Payer: Self-pay

## 2011-11-23 DIAGNOSIS — G40409 Other generalized epilepsy and epileptic syndromes, not intractable, without status epilepticus: Secondary | ICD-10-CM

## 2011-11-23 DIAGNOSIS — F411 Generalized anxiety disorder: Secondary | ICD-10-CM | POA: Diagnosis present

## 2011-11-23 DIAGNOSIS — R6251 Failure to thrive (child): Secondary | ICD-10-CM

## 2011-11-23 DIAGNOSIS — R5381 Other malaise: Secondary | ICD-10-CM | POA: Diagnosis present

## 2011-11-23 DIAGNOSIS — S069X9S Unspecified intracranial injury with loss of consciousness of unspecified duration, sequela: Secondary | ICD-10-CM

## 2011-11-23 DIAGNOSIS — N390 Urinary tract infection, site not specified: Secondary | ICD-10-CM

## 2011-11-23 DIAGNOSIS — K219 Gastro-esophageal reflux disease without esophagitis: Secondary | ICD-10-CM | POA: Diagnosis present

## 2011-11-23 DIAGNOSIS — E870 Hyperosmolality and hypernatremia: Secondary | ICD-10-CM

## 2011-11-23 DIAGNOSIS — E039 Hypothyroidism, unspecified: Secondary | ICD-10-CM | POA: Diagnosis present

## 2011-11-23 DIAGNOSIS — R633 Feeding difficulties, unspecified: Secondary | ICD-10-CM | POA: Diagnosis present

## 2011-11-23 DIAGNOSIS — I4891 Unspecified atrial fibrillation: Secondary | ICD-10-CM | POA: Diagnosis present

## 2011-11-23 DIAGNOSIS — Z880 Allergy status to penicillin: Secondary | ICD-10-CM

## 2011-11-23 DIAGNOSIS — Z23 Encounter for immunization: Secondary | ICD-10-CM

## 2011-11-23 DIAGNOSIS — Z87440 Personal history of urinary (tract) infections: Secondary | ICD-10-CM

## 2011-11-23 DIAGNOSIS — H409 Unspecified glaucoma: Secondary | ICD-10-CM | POA: Diagnosis present

## 2011-11-23 DIAGNOSIS — E2749 Other adrenocortical insufficiency: Secondary | ICD-10-CM | POA: Diagnosis present

## 2011-11-23 DIAGNOSIS — R4182 Altered mental status, unspecified: Secondary | ICD-10-CM

## 2011-11-23 DIAGNOSIS — K5900 Constipation, unspecified: Secondary | ICD-10-CM | POA: Diagnosis present

## 2011-11-23 DIAGNOSIS — E876 Hypokalemia: Secondary | ICD-10-CM

## 2011-11-23 DIAGNOSIS — D698 Other specified hemorrhagic conditions: Secondary | ICD-10-CM | POA: Insufficient documentation

## 2011-11-23 DIAGNOSIS — Z885 Allergy status to narcotic agent status: Secondary | ICD-10-CM

## 2011-11-23 DIAGNOSIS — R1310 Dysphagia, unspecified: Secondary | ICD-10-CM | POA: Diagnosis present

## 2011-11-23 DIAGNOSIS — S069XAS Unspecified intracranial injury with loss of consciousness status unknown, sequela: Secondary | ICD-10-CM

## 2011-11-23 DIAGNOSIS — G8250 Quadriplegia, unspecified: Secondary | ICD-10-CM | POA: Diagnosis present

## 2011-11-23 DIAGNOSIS — E162 Hypoglycemia, unspecified: Principal | ICD-10-CM

## 2011-11-23 DIAGNOSIS — E860 Dehydration: Secondary | ICD-10-CM

## 2011-11-23 DIAGNOSIS — G40309 Generalized idiopathic epilepsy and epileptic syndromes, not intractable, without status epilepticus: Secondary | ICD-10-CM | POA: Diagnosis present

## 2011-11-23 DIAGNOSIS — E878 Other disorders of electrolyte and fluid balance, not elsewhere classified: Secondary | ICD-10-CM

## 2011-11-23 DIAGNOSIS — Z79899 Other long term (current) drug therapy: Secondary | ICD-10-CM

## 2011-11-23 DIAGNOSIS — F32A Depression, unspecified: Secondary | ICD-10-CM

## 2011-11-23 DIAGNOSIS — N179 Acute kidney failure, unspecified: Secondary | ICD-10-CM

## 2011-11-23 DIAGNOSIS — Z8679 Personal history of other diseases of the circulatory system: Secondary | ICD-10-CM | POA: Insufficient documentation

## 2011-11-23 DIAGNOSIS — R627 Adult failure to thrive: Secondary | ICD-10-CM

## 2011-11-23 DIAGNOSIS — F3289 Other specified depressive episodes: Secondary | ICD-10-CM | POA: Diagnosis present

## 2011-11-23 DIAGNOSIS — F329 Major depressive disorder, single episode, unspecified: Secondary | ICD-10-CM | POA: Insufficient documentation

## 2011-11-23 DIAGNOSIS — K5909 Other constipation: Secondary | ICD-10-CM

## 2011-11-23 DIAGNOSIS — G931 Anoxic brain damage, not elsewhere classified: Secondary | ICD-10-CM

## 2011-11-23 DIAGNOSIS — E785 Hyperlipidemia, unspecified: Secondary | ICD-10-CM | POA: Diagnosis present

## 2011-11-23 DIAGNOSIS — D696 Thrombocytopenia, unspecified: Secondary | ICD-10-CM

## 2011-11-23 DIAGNOSIS — X58XXXS Exposure to other specified factors, sequela: Secondary | ICD-10-CM

## 2011-11-23 DIAGNOSIS — E0590 Thyrotoxicosis, unspecified without thyrotoxic crisis or storm: Secondary | ICD-10-CM

## 2011-11-23 DIAGNOSIS — Y929 Unspecified place or not applicable: Secondary | ICD-10-CM

## 2011-11-23 DIAGNOSIS — E46 Unspecified protein-calorie malnutrition: Secondary | ICD-10-CM

## 2011-11-23 HISTORY — DX: Reserved for inherently not codable concepts without codable children: IMO0001

## 2011-11-23 HISTORY — DX: Encephalopathy, unspecified: G93.40

## 2011-11-23 HISTORY — DX: Other adrenocortical insufficiency: E27.49

## 2011-11-23 HISTORY — DX: Other constipation: K59.09

## 2011-11-23 HISTORY — DX: Hypothyroidism, unspecified: E03.9

## 2011-11-23 HISTORY — DX: Quadriplegia, unspecified: G82.50

## 2011-11-23 HISTORY — DX: Gastro-esophageal reflux disease without esophagitis: K21.9

## 2011-11-23 HISTORY — DX: Unspecified atrial fibrillation: I48.91

## 2011-11-23 LAB — DIFFERENTIAL
Basophils Absolute: 0 10*3/uL (ref 0.0–0.1)
Eosinophils Relative: 1 % (ref 0–5)
Lymphocytes Relative: 26 % (ref 12–46)
Lymphs Abs: 1.9 10*3/uL (ref 0.7–4.0)
Monocytes Absolute: 0.8 10*3/uL (ref 0.1–1.0)
Neutro Abs: 4.6 10*3/uL (ref 1.7–7.7)

## 2011-11-23 LAB — COMPREHENSIVE METABOLIC PANEL
ALT: 13 U/L (ref 0–53)
AST: 19 U/L (ref 0–37)
CO2: 23 mEq/L (ref 19–32)
Chloride: 106 mEq/L (ref 96–112)
Creatinine, Ser: 0.79 mg/dL (ref 0.50–1.35)
GFR calc Af Amer: >90 mL/min (ref >90)
GFR calc non Af Amer: >90 mL/min (ref >90)
Glucose, Bld: 69 mg/dL — ABNORMAL LOW (ref 70–99)
Sodium: 138 mEq/L (ref 135–145)
Total Bilirubin: 0.5 mg/dL (ref 0.3–1.2)

## 2011-11-23 LAB — URINALYSIS, ROUTINE W REFLEX MICROSCOPIC
Glucose, UA: NEGATIVE mg/dL
Hgb urine dipstick: NEGATIVE
Ketones, ur: 40 mg/dL — AB
Protein, ur: NEGATIVE mg/dL

## 2011-11-23 LAB — CBC
HCT: 40.8 % (ref 39.0–52.0)
Hemoglobin: 13.8 g/dL (ref 13.0–17.0)
MCV: 86.3 fL (ref 78.0–100.0)
RBC: 4.73 MIL/uL (ref 4.22–5.81)
RDW: 14 % (ref 11.5–15.5)
WBC: 7.4 10*3/uL (ref 4.0–10.5)

## 2011-11-23 LAB — GLUCOSE, CAPILLARY: Glucose-Capillary: 61 mg/dL — ABNORMAL LOW (ref 70–99)

## 2011-11-23 MED ORDER — SODIUM CHLORIDE 0.9 % IV BOLUS (SEPSIS)
1000.0000 mL | Freq: Once | INTRAVENOUS | Status: AC
  Administered 2011-11-23: 1000 mL via INTRAVENOUS

## 2011-11-23 MED ORDER — ONDANSETRON HCL 4 MG/2ML IJ SOLN
4.0000 mg | Freq: Once | INTRAMUSCULAR | Status: AC
Start: 2011-11-23 — End: 2011-11-23
  Administered 2011-11-23: 4 mg via INTRAVENOUS
  Filled 2011-11-23: qty 2

## 2011-11-23 MED ORDER — DEXTROSE 50 % IV SOLN
25.0000 mL | Freq: Once | INTRAVENOUS | Status: AC
  Administered 2011-11-23: 25 mL via INTRAVENOUS

## 2011-11-23 MED ORDER — DEXTROSE 50 % IV SOLN
INTRAVENOUS | Status: AC
  Filled 2011-11-23: qty 50

## 2011-11-23 MED ORDER — DEXTROSE-NACL 5-0.9 % IV SOLN
INTRAVENOUS | Status: DC
  Administered 2011-11-23 – 2011-11-27 (×9): via INTRAVENOUS

## 2011-11-23 NOTE — ED Provider Notes (Signed)
History     CSN: [id number] Arrival date & time: 11/23/2011 10:56 AM   First MD Initiated Contact with Patient 11/23/11 1128      Chief Complaint  Patient presents with  . Nausea  . Emesis    this am  . Pain    generalized     (Consider location/radiation/quality/duration/timing/severity/associated sxs/prior treatment) HPI Comments: Patient sent from nursing home [facility] because he is not eating, had an episode of nausea and vomiting, suggest considering a g-tube.  Mother (over phone) notes that patient was admitted to [facility] a few weeks ago for UTI and has been "out of it since," states she wants him to have some physical rehab to get his strength back.  Per prior notes, patient was admitted to ICU for hypernatremia, UTI, acute renal failure, 11/12/11-11/20/11.  History is severely limited due to patient's inability to communicate, mother is a poor historian, and I am unable to contact the nursing home.    The history is provided by a parent and the nursing home. The history is limited by the absence of a caregiver and the condition of the patient (anoxic brain injury - patient does not communicate much at baseline).    Past Medical History  Diagnosis Date  . Addison disease   . Anoxic brain injury   . A-fib   . Seizure disorder   . Dysphagia   . History of recurrent UTIs   . Hyperlipemia   . Aspiration pneumonia   . Mental disorder   . Anxiety     Past Surgical History  Procedure Date  . Nephrectomy unknown    No family history on file.  History  Substance Use Topics  . Smoking status: Never Smoker   . Smokeless tobacco: Never Used  . Alcohol Use: No      Review of Systems  Unable to perform ROS   Allergies  Latex; Morphine and related; Penicillins; Pyridium; and Soy allergy  Home Medications   Current Outpatient Rx  Name Route Sig Dispense Refill  . CITALOPRAM HYDROBROMIDE 20 MG PO TABS Oral Take 20 mg by mouth daily.      Marland Kitchen DOCUSATE SODIUM  100 MG PO CAPS Oral Take 100 mg by mouth 2 (two) times daily.      . DULOXETINE HCL 60 MG PO CPEP Oral Take 60 mg by mouth daily.      Marland Kitchen PRO-STAT 64 PO LIQD Oral Take 30 mLs by mouth 2 (two) times daily.      Marland Kitchen FERROUS SULFATE 325 (65 FE) MG PO TABS Oral Take 325 mg by mouth 3 (three) times daily with meals.      . OMEGA-3 FATTY ACIDS 1000 MG PO CAPS Oral Take 1 g by mouth 2 (two) times daily.      . IPRATROPIUM-ALBUTEROL 0.5-2.5 (3) MG/3ML IN SOLN Nebulization Take 3 mLs by nebulization every 6 (six) hours as needed. SHORTNESS OF BREATH     . METHIMAZOLE 5 MG PO TABS Oral Take 5 mg by mouth daily.      . METHYLCELLULOSE 1 % OP SOLN Both Eyes Place 1 drop into both eyes 4 (four) times daily.      Marland Kitchen METOPROLOL TARTRATE 25 MG PO TABS Oral Take 0.5 tablets (12.5 mg total) by mouth 2 (two) times daily. Hold for systolic blood pressure less than 110 or heart rate less than 60 1 tablet 0  . MINERAL OIL RE ENEM Rectal Place 1 enema rectally as needed. FOR CONSTIPATION     .  THERA M PLUS PO TABS Oral Take 1 tablet by mouth daily.      Marland Kitchen PANTOPRAZOLE SODIUM 40 MG PO TBEC Oral Take 40 mg by mouth daily. USES PACKET, SO MUST CRUSH TABLETS     . POLYETHYLENE GLYCOL 3350 PO PACK Oral Take 17 g by mouth 2 (two) times daily.        BP 126/86  Pulse 87  Temp(Src) 97.6 F (36.4 C) (Rectal)  SpO2 100%  Physical Exam  Constitutional: He is oriented to person, place, and time. He appears well-developed and well-nourished.  HENT:  Head: Normocephalic and atraumatic.  Neck: Neck supple.  Cardiovascular: Normal rate and regular rhythm.   Pulmonary/Chest: Effort normal and breath sounds normal. He has no wheezes. He has no rales.  Abdominal: Soft. He exhibits no distension and no mass. There is no tenderness. There is no rebound and no guarding.  Neurological: He is alert and oriented to person, place, and time. He has normal strength.       Patient responds to commands, moves all extremities.      ED  Course  Procedures (including critical care time)  Labs Reviewed  CBC - Abnormal; Notable for the following:    Platelets 122 (*)    All other components within normal limits  COMPREHENSIVE METABOLIC PANEL - Abnormal; Notable for the following:    Glucose, Bld 69 (*)    Albumin 3.2 (*)    All other components within normal limits  URINALYSIS, ROUTINE W REFLEX MICROSCOPIC - Abnormal; Notable for the following:    Color, Urine AMBER (*) BIOCHEMICALS MAY BE AFFECTED BY COLOR   Bilirubin Urine SMALL (*)    Ketones, ur 40 (*)    All other components within normal limits  DIFFERENTIAL  LIPASE, BLOOD  LACTIC ACID, PLASMA  CULTURE, BLOOD (ROUTINE X 2)  CULTURE, BLOOD (ROUTINE X 2)   Dg Abd Acute W/chest  11/23/2011  *RADIOLOGY REPORT*  Clinical Data: Abdominal pain, nausea, vomiting.  ACUTE ABDOMEN SERIES (ABDOMEN 2 VIEW & CHEST 1 VIEW)  Comparison: 10/12/2011  Findings: Low lung volumes.  Lungs are clear.  Heart is normal size.  Left PICC line is in place with the tip in the SVC.  IMPRESSION: Low lung volumes.  No active disease.  Original Report Authenticated By: Cyndie Chime, M.D.   1:53 PM I have discussed patient with Dr Patrica Duel, who has also seen the patient.   2:48 PM Have spoken with Selena Batten, case management, who recommends social work  4:53 PM Attempted to contact [employer].  No answer and no machine picks up.  [phone]  5:47 PM Spoke with [facility] hospitalist who will admit patient.  Team 5.     Date: 11/23/2011  Rate: 73  Rhythm: normal sinus rhythm  QRS Axis: normal  Intervals: normal  ST/T Wave abnormalities: normal  Conduction Disutrbances:none  Narrative Interpretation:   Old EKG Reviewed: no significant changes - last ekg sinus tachycardia    1. Failure to thrive   2. Hypoglycemia   3. Dehydration       MDM  Patient with anoxic brain injury unable to communicate but follows commands and moves all extremities, admitted for failure to thrive.  Pt lives in SNF,  has not been eating, sent in for consideration of g-tube.  Pt currently with PICC line, will need to consider alternative method for delivering nutrition.          Rise Patience, [place] 11/23/11 1901

## 2011-11-23 NOTE — Discharge Planning (Signed)
Late Entry for 1445 11/23/11: ED CM spoke with Irving Burton, [place] about therapy needs for pt while at [facility].  CM referred Irving Burton to ED SW.    ED CM spoke again with [facility]. Discussed possible orders for the snf

## 2011-11-23 NOTE — ED Notes (Signed)
Patient transported to X-ray 

## 2011-11-23 NOTE — H&P (Signed)
History and Physical Examination  Date: 11/23/2011  Patient name: [name] Medical record number: [id number] Date of birth: 12/10/1966 Age: 45 y.o. Gender: male PCP: [name], MD  Attending physician: Ramiro Harvest  Chief Complaint: Seizure   Historians: ER physician, [facility], ER Records, Family Accounts.  Pt poor historian, nonverbal and not able to give history.   History of Present Illness: [name] is an 45 y.o. male who is a resident of the [facility] skilled nursing facility.  He was sent to the emergency department today because when the caretakers were trying to feed the patient today he apparently had some type of seizure-like activity where he began rolling his eyes, his arms and hands were shaking, his lips turned blue, and his face became contorted.  This is according to the account of the caretaker at the facility.  They sent him to the emergency department because they've been having more difficulty feeding him and they've never witnessed him having this type of an episode.  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  When he got into the emergency department he was found to be hypoglycemic with a blood sugar of 61 at the time.  He was given dextrose IV and it corrected.  The patient had recently been discharged approximately one week ago from the hospital for severe dehydration and other medical issues.  It was noted that if the patient continued to have difficulty with feeding and dysphasia that a PEG tube should be considered.  The patient has had a PEG tube in the past.  Now his family is requesting a PEG tube so that he can have nutrition through the tube.  At this time he is at high-risk for further hypoglycemia and dehydration and not able to take his medications without concern about aspiration.  Hospital admission was requested for further evaluation for his failure to thrive, worsening physical  debilitation and dysphasia and poor by mouth intake.   Past Medical History  Diagnosis Date  . Addison disease   . Anoxic brain injury   . A-fib   . Seizure disorder   . Dysphagia   . History of recurrent UTIs   . Hyperlipemia   . Aspiration pneumonia   . Mental disorder   . Anxiety   . Reflux   . Dysphagia   . Glucocorticoid deficiency   . Atrial fibrillation   . Glaucoma   . Hypothyroidism   . Encephalopathy   . Quadriplegia   . Addison disease     Meds: Medications Prior to Admission  Medication Dose Route Frequency Provider Last Rate Last Dose  . dextrose 5 %-0.9 % sodium chloride infusion   Intravenous Continuous Rise Patience, PA 100 mL/hr at 11/23/11 1730    . dextrose 50 % solution 25 mL  25 mL Intravenous Once Rise Patience, PA   25 mL at 11/23/11 1723  . dextrose 50 % solution           . ondansetron (ZOFRAN) injection 4 mg  4 mg Intravenous Once Rise Patience, PA   4 mg at 11/23/11 1347  . sodium chloride 0.9 % bolus 1,000 mL  1,000 mL Intravenous Once Rise Patience, PA   1,000 mL at 11/23/11 1345   Medications Prior to Admission  Medication Sig Dispense Refill  . citalopram (CELEXA) 20 MG tablet Take 20 mg by mouth daily.        Marland Kitchen  docusate sodium (COLACE) 100 MG capsule Take 100 mg by mouth 2 (two) times daily.        . DULoxetine (CYMBALTA) 60 MG capsule Take 60 mg by mouth daily.        . feeding supplement (PRO-STAT SUGAR FREE 64) LIQD Take 30 mLs by mouth 2 (two) times daily.        . ferrous sulfate 325 (65 FE) MG tablet Take 325 mg by mouth 3 (three) times daily with meals.        . fish oil-omega-3 fatty acids 1000 MG capsule Take 1 g by mouth 2 (two) times daily.        Marland Kitchen ipratropium-albuterol (DUONEB) 0.5-2.5 (3) MG/3ML SOLN Take 3 mLs by nebulization every 6 (six) hours as needed. SHORTNESS OF BREATH       . methimazole (TAPAZOLE) 5 MG tablet Take 5 mg by mouth daily.        . methylcellulose (ARTIFICIAL TEARS) 1 % ophthalmic solution Place 1 drop into  both eyes 4 (four) times daily.        . metoprolol tartrate (LOPRESSOR) 25 MG tablet Take 0.5 tablets (12.5 mg total) by mouth 2 (two) times daily. Hold for systolic blood pressure less than 110 or heart rate less than 60  1 tablet  0  . mineral oil enema Place 1 enema rectally as needed. FOR CONSTIPATION       . pantoprazole (PROTONIX) 40 MG tablet Take 40 mg by mouth daily. USES PACKET, SO MUST CRUSH TABLETS       . polyethylene glycol (MIRALAX / GLYCOLAX) packet Take 17 g by mouth 2 (two) times daily.          Allergies: Latex; Morphine and related; Penicillins; Pyridium; and Soy allergy  Social History:  History   Social History  . Marital Status: Divorced    Spouse Name: N/A    Number of Children: N/A  . Years of Education: N/A   Occupational History  . Not on file.   Social History Main Topics  . Smoking status: Never Smoker   . Smokeless tobacco: Never Used  . Alcohol Use: No  . Drug Use: No  . Sexually Active: No   Other Topics Concern  . Not on file   Social History Narrative  . No narrative on file   Family History: History reviewed. No pertinent family history. Past Surgical History:  Past Surgical History  Procedure Date  . Nephrectomy unknown    Review of Systems: Review of systems not obtained due to patient factors.   Physical Exam: Blood pressure 105/86, pulse 81, temperature 97.6 F (36.4 C), temperature source Rectal, resp. rate 15, SpO2 100.00%.  Gen. exam-the patient is generally deconditioned and in a fetal position in the exam bed, he is awake and alert and he does not yes or no to specific questions and appears to be in no distress at this time. HEENT-normocephalic, mucous membranes moist and pink Neck-supple, no thyromegaly or goiter noted, no JVD Lungs bilateral press sounds clear auscultation but shallow CV-normal S1-S2 sounds noted Abdomen-obese, soft, bowel sounds present, no masses palpated, healed old scars Extremities-no  pretibial edema noted, no cyanosis noted. Neurological exam-patient moving lower extremities on demand, I was not able to get him to move his upper extremities at this time.  Patient unable to follow other commands. Psychiatric-very flat affect noted.  Lab results:  [facility] 11/23/11 1244  NA 138  K 3.9  CL 106  CO2 23  GLUCOSE 69*  BUN 9  CREATININE 0.79  CALCIUM 9.2  MG --  PHOS --    Basename 11/23/11 1244  AST 19  ALT 13  ALKPHOS 73  BILITOT 0.5  PROT 7.0  ALBUMIN 3.2*    Basename 11/23/11 1244  LIPASE 33  AMYLASE --    Basename 11/23/11 1244  WBC 7.4  NEUTROABS 4.6  HGB 13.8  HCT 40.8  MCV 86.3  PLT 122*    Basename 11/23/11 1355  CKTOTAL --  CKMB --  CKMBINDEX --  TROPONINI <0.30   No results found for this basename: TSH,T4TOTAL,FREET3,T3FREE,THYROIDAB in the last 72 hours No results found for this basename: VITAMINB12:2,FOLATE:2,FERRITIN:2,TIBC:2,IRON:2,RETICCTPCT:2 in the last 72 hours  EKG Results:  Orders placed during the hospital encounter of 11/23/11  . EKG 12-LEAD  . EKG 12-LEAD   reviewed EKG results no acute ST-T wave abnormalities noted.  Imaging results:  Dg Abd Acute W/chest  11/23/2011  *RADIOLOGY REPORT*  Clinical Data: Abdominal pain, nausea, vomiting.  ACUTE ABDOMEN SERIES (ABDOMEN 2 VIEW & CHEST 1 VIEW)  Comparison: 10/12/2011  Findings: Low lung volumes.  Lungs are clear.  Heart is normal size.  Left PICC line is in place with the tip in the SVC.  IMPRESSION: Low lung volumes.  No active disease.  Original Report Authenticated By: Cyndie Chime, M.D.     Impression   Questionable Grand mal seizure, although I suspect he may have had a hypoglycemic seizure  Depression (emotion), secondary to his anoxic brain injury  History of atrial fibrillation, currently not on anticoagulation because of history of bleeding problems  Other specified hemorrhagic conditions  Altered mental status  Glaucoma  Failure to  thrive Hypoglycemia Dysphasia, recurrent Worsening physical debilitation High risk for aspiration Cognitive impairment History of anemia History of respiratory failure  Plan  I called the [facility] to discuss with the charge nurse the patient's care and reason for being sent to the hospital emergency department.  They verbalize that they were concerned that the patient is having a difficult time eating and may need a PEG tube for proper nutrition.  Also, with his hypoglycemia I would like to start him on dextrose containing IV fluids.  In addition, patient will need PEG tube placement.  Will consider neuro evaluation for possible seizure activity requiring medication although I think that he may have had a hypoglycemic seizure event.  Plan to consult physical therapy for evaluation and management recommendations regarding his worsening debilitation.  Aspiration precautions recommended.  Try to contact patient's, power of attorney and was unable to get a response this evening regarding CODE STATUS.  We'll readdress again through the primary team.  We'll continue to monitor electrolytes.  Will monitor blood glucose closely.  Hypoglycemia protocol instituted.  Please see hospital records for further details of this admission.   Sanaiyah Kirchhoff JohnsonMD 11/23/2011, 8:44 PM [phone]

## 2011-11-23 NOTE — Discharge Planning (Signed)
Called [facility] at [phone] spoke with about possible therapy services (PT/OT/Speech) Confirmed Rehab that includes PT/OT/Speech is available at the facility with different levels of care snf, assisted living, etc.  Melany Guernsey RN, reports Dr Leanord Hawking is the medical provider at the facility and orders for therapies can be written and Dr Leanord Hawking will continue services at the snf

## 2011-11-23 NOTE — ED Notes (Signed)
Pt was here on the 11/23 dx with uti and is brought in today from [facility] for g tube placement and n/v since the weekend. picc line to lt upper arm.

## 2011-11-24 LAB — GLUCOSE, CAPILLARY: Glucose-Capillary: 81 mg/dL (ref 70–99)

## 2011-11-24 LAB — HEPARIN INDUCED THROMBOCYTOPENIA PNL
UFH High Dose UFH H: 1 % Release
UFH Low Dose 0.1 IU/mL: 2 % Release
UFH Low Dose 0.5 IU/mL: 1 % Release
UFH SRA Result: NEGATIVE

## 2011-11-24 LAB — CBC
HCT: 38 % — ABNORMAL LOW (ref 39.0–52.0)
Hemoglobin: 12.6 g/dL — ABNORMAL LOW (ref 13.0–17.0)
MCHC: 33.2 g/dL (ref 30.0–36.0)
MCV: 86.8 fL (ref 78.0–100.0)
RDW: 14.1 % (ref 11.5–15.5)

## 2011-11-24 LAB — PROTIME-INR: INR: 1.12 (ref 0.00–1.49)

## 2011-11-24 LAB — HEMOGLOBIN A1C: Mean Plasma Glucose: 85 mg/dL (ref <117)

## 2011-11-24 LAB — MAGNESIUM: Magnesium: 1.6 mg/dL (ref 1.5–2.5)

## 2011-11-24 MED ORDER — METHYLCELLULOSE 1 % OP SOLN: 1.0000 [drp] | Freq: Four times a day (QID) | OPHTHALMIC | Status: DC

## 2011-11-24 MED ORDER — METHIMAZOLE 5 MG PO TABS
5.0000 mg | ORAL_TABLET | Freq: Every day | ORAL | Status: DC
  Administered 2011-11-25 – 2011-11-30 (×4): 5 mg via ORAL
  Filled 2011-11-24 (×7): qty 1

## 2011-11-24 MED ORDER — ACETAMINOPHEN 650 MG RE SUPP: 650.0000 mg | Freq: Four times a day (QID) | RECTAL | Status: DC | PRN

## 2011-11-24 MED ORDER — THERA M PLUS PO TABS
1.0000 | ORAL_TABLET | Freq: Every day | ORAL | Status: DC
  Administered 2011-11-28: 1 via ORAL
  Filled 2011-11-24 (×4): qty 1

## 2011-11-24 MED ORDER — ACETAMINOPHEN 325 MG PO TABS
650.0000 mg | ORAL_TABLET | Freq: Four times a day (QID) | ORAL | Status: DC | PRN
  Administered 2011-11-29: 650 mg via ORAL
  Filled 2011-11-24: qty 2

## 2011-11-24 MED ORDER — CITALOPRAM HYDROBROMIDE 20 MG PO TABS
20.0000 mg | ORAL_TABLET | Freq: Every day | ORAL | Status: DC
  Administered 2011-11-25 – 2011-11-30 (×4): 20 mg via ORAL
  Filled 2011-11-24 (×7): qty 1

## 2011-11-24 MED ORDER — FERROUS SULFATE 325 (65 FE) MG PO TABS
325.0000 mg | ORAL_TABLET | Freq: Every day | ORAL | Status: DC
  Filled 2011-11-24 (×5): qty 1

## 2011-11-24 MED ORDER — PRO-STAT SUGAR FREE PO LIQD
30.0000 mL | Freq: Two times a day (BID) | ORAL | Status: DC
  Administered 2011-11-25 – 2011-11-26 (×2): 30 mL via ORAL
  Filled 2011-11-24 (×9): qty 30

## 2011-11-24 MED ORDER — POLYETHYLENE GLYCOL 3350 17 G PO PACK
17.0000 g | PACK | Freq: Two times a day (BID) | ORAL | Status: DC
  Administered 2011-11-25 – 2011-11-30 (×7): 17 g via ORAL
  Filled 2011-11-24 (×16): qty 1

## 2011-11-24 MED ORDER — ALBUTEROL SULFATE (5 MG/ML) 0.5% IN NEBU: 2.5000 mg | INHALATION_SOLUTION | Freq: Four times a day (QID) | RESPIRATORY_TRACT | Status: DC | PRN

## 2011-11-24 MED ORDER — PANTOPRAZOLE SODIUM 40 MG PO TBEC
40.0000 mg | DELAYED_RELEASE_TABLET | Freq: Every day | ORAL | Status: DC
  Filled 2011-11-24 (×4): qty 1

## 2011-11-24 MED ORDER — DULOXETINE HCL 60 MG PO CPEP
60.0000 mg | ORAL_CAPSULE | Freq: Every day | ORAL | Status: DC
  Administered 2011-11-25: 60 mg via ORAL
  Filled 2011-11-24 (×5): qty 1

## 2011-11-24 MED ORDER — METOPROLOL TARTRATE 12.5 MG HALF TABLET
12.5000 mg | ORAL_TABLET | Freq: Two times a day (BID) | ORAL | Status: DC
  Administered 2011-11-25 – 2011-11-28 (×4): 12.5 mg via ORAL
  Filled 2011-11-24 (×8): qty 1
  Filled 2011-11-24: qty 0.5
  Filled 2011-11-24: qty 1

## 2011-11-24 MED ORDER — IPRATROPIUM BROMIDE 0.02 % IN SOLN: 0.5000 mg | Freq: Four times a day (QID) | RESPIRATORY_TRACT | Status: DC | PRN

## 2011-11-24 MED ORDER — POLYVINYL ALCOHOL 1.4 % OP SOLN
1.0000 [drp] | Freq: Four times a day (QID) | OPHTHALMIC | Status: DC
  Administered 2011-11-24 – 2011-11-30 (×21): 1 [drp] via OPHTHALMIC
  Filled 2011-11-24 (×2): qty 15

## 2011-11-24 MED ORDER — MINERAL OIL RE ENEM: 1.0000 | ENEMA | RECTAL | Status: DC | PRN

## 2011-11-24 MED ORDER — DOCUSATE SODIUM 100 MG PO CAPS
100.0000 mg | ORAL_CAPSULE | Freq: Two times a day (BID) | ORAL | Status: DC
  Administered 2011-11-25: 100 mg via ORAL
  Filled 2011-11-24 (×12): qty 1

## 2011-11-24 MED ORDER — IPRATROPIUM-ALBUTEROL 0.5-2.5 (3) MG/3ML IN SOLN: 3.0000 mL | Freq: Four times a day (QID) | RESPIRATORY_TRACT | Status: DC | PRN

## 2011-11-24 MED ORDER — ONDANSETRON HCL 4 MG/2ML IJ SOLN
4.0000 mg | Freq: Four times a day (QID) | INTRAMUSCULAR | Status: DC | PRN
  Administered 2011-11-24 – 2011-11-29 (×4): 4 mg via INTRAVENOUS
  Filled 2011-11-24 (×5): qty 2

## 2011-11-24 NOTE — ED Notes (Signed)
One attempt made to call report to floor, nurse was not available to take report, expecting return call.

## 2011-11-24 NOTE — Progress Notes (Addendum)
Speech Language/Pathology Clinical/Bedside Swallow Evaluation Patient Details  Name: [name] MRN: [id number] DOB: [birth year] Today's Date: 11/24/2011 1420 to chart, 1427 left message with SNF, [phone] with pt, 1520 on phone with SNF SLP  Past Medical History:  Past Medical History  Diagnosis Date  . Addison disease   . Anoxic brain injury   . A-fib   . Seizure disorder   . Dysphagia   . History of recurrent UTIs   . Hyperlipemia   . Aspiration pneumonia   . Mental disorder   . Anxiety   . Reflux   . Dysphagia   . Glucocorticoid deficiency   . Atrial fibrillation   . Glaucoma   . Hypothyroidism   . Encephalopathy   . Quadriplegia   . Addison disease    Past Surgical History:  Past Surgical History  Procedure Date  . Nephrectomy unknown    Assessment/Recommendations/Treatment Plan  Of note, pt reportedly is impulsive and requires full assist for all po.  Pt spends most of the day in bed, transfers to w/c for meals at SNF with nurse assistance but will "throw himself into bed" by himself with resultant falls per SNF SLP. RN informed of this   SLP Assessment Clinical Impression Statement: Pt appeared to overtly aspirate a small amount of thin water given for oral care, suspect inhalation before or premature spillage into larynx/trachea before the swallow.  Pt complained of dyspnea and nausea after minimal intake, ? trying to regurgitate?  Rec pt continue NPO with oral care due to aspiration risk and ? GI issues with N/V prior to admit.  SLP to return next date for re-evaluation, ? if pt having GI issues given SNF SLP reports of pt vomiting all po prior to admit.  Pt will be a chronic aspiration and asp pna risk due to his dysphagia, impulsivity with BI, respiratory issues/? anxiety with discoordination breathing/swallowing and ? diaphragmatic paresis with limited ability to clear aspirates.  SNF SLP reported pt with inconsistent respiratory pattern prior to admit as  well, which appears consistent with today's finding.  Pt reportedly tolerated TF well when he had the PEG per this SLP discussion with SNF SLP.  Question if pt would benefit from PEG for nutrition and continue minimal amounts of po for comfort/prevention of disuse muscle atrophy .   Risk for Aspiration: Severe Other Related Risk Factors: History of pneumonia;History of dysphagia  Recommendations Recommended Consults: Other (Comment) General Recommendation: NPO (reeval next date) Oral Care Recommendations: Oral care QID  Prognosis Prognosis for Safe Diet Advancement: Guarded Barriers to Reach Goals: Cognitive deficits  Individuals Consulted Consulted and Agree with Results and Recommendations: Patient Family Member Consulted: family not present, pt verbalized undestanding but with BI, uncertain of ability to fully comprehend  Swallow Study Goals  SLP Swallowing Goals Patient will utilize recommended strategies during swallow to increase swallowing safety with: Total assistance  Swallow Study Prior Functional Status  Type of Home: Skilled Nursing Facility  General  Date of Onset: 11/24/11 Other Pertinent Information: 45 year old male adm to [facility] 11-23-11, ? seizure activity while eating (being fed by RN), PMH + for dysphagia s/p PEG, anoxic brain injury s/p cardiac arrest, recurrent asp pna, recurrent sepsis, VDRF, large groin hematoma with sepsis.  Pt recently in hospital for UTI, poor intake, dc'd hospital mid November.  Family desires pt to receive PEG.  SLP spoke to SNF SLP who reports pt requiring nurse to feed him d/t impulsivity and had n/v with all po prior  to admission,.  Pt had an MBS Feb 2012 recommending Dys3.Nectar due to pt's delayed swallow initiation, and mild sensory motor deficits, consistent with previous MBS.  SNF ST reports pt had been tolerating nectar and ground prior to recent admission to [facility]..  Pt was seen at [facility] by SLP with recommendation of Dys2/Honey and follow  up at SNF, with ongoing moderate aspiration risk.   Type of Study: Bedside swallow evaluation   Diet Prior to this Study: NPO;IV Respiratory Status: Other (comment) (2 liters oxygen) Trach Size and Type: Other (Comment) (h/o trach placement, ? during anoxic BI incident) History of Intubation: Yes Behavior/Cognition: Alert;Impulsive;Other (comment) (aphasic) Oral Cavity - Dentition: Adequate natural dentition Vision: Functional for self-feeding Patient Positioning: Upright in bed Baseline Vocal Quality: Aphonic;Low vocal intensity;Other (comment);Breathy (at times pt able to obtain voicing) Volitional Cough: Weak;Other (Comment) (suspect secondary to quadriparesis) Volitional Swallow: Unable to elicit Ice chips: Not tested  Oral Motor/Sensory Function  Labial ROM:  (weakness) Labial Strength:  (weakness) Lingual Strength:  (weakness) Facial Strength: Within Functional Limits Velum: Within Functional Limits Mandible:  (? if pt clenches jaw/grinds teeth?)  Consistency Results  Ice Chips Ice chips: Not tested  Thin Liquid Presentation: Cup;Self Fed Other Comments: Overt cough observed with swallow attempt, ? inhalation of water d/t swallow/resp incoordination/premature spiillage into larynx/trachea.   This occured when pt was given thin water to swish and expectorate.  After this event, pt complained of shortness of breath and inability to cough to clear.  Advised RN to finding and recommended continued npo and reeval next date.  This occured priror to SLP receiving return call from SNF.     Nectar Thick Liquid Nectar Thick Liquid: Not tested  Honey Thick Liquid Honey Thick Liquid: Not tested  Puree Puree: Not tested  Solid Solid: Not tested   Chales Abrahams 11/24/2011,3:47 PM

## 2011-11-24 NOTE — Progress Notes (Signed)
INITIAL ADULT NUTRITION ASSESSMENT Date: 11/24/2011   Time: 4:32 PM Reason for Assessment: MD consult  ASSESSMENT: Male 45 y.o.  Dx: Hypoglycemia  Hx:  Past Medical History  Diagnosis Date  . Addison disease   . Anoxic brain injury   . A-fib   . Seizure disorder   . Dysphagia   . History of recurrent UTIs   . Hyperlipemia   . Aspiration pneumonia   . Mental disorder   . Anxiety   . Reflux   . Dysphagia   . Glucocorticoid deficiency   . Atrial fibrillation   . Glaucoma   . Hypothyroidism   . Encephalopathy   . Quadriplegia   . Addison disease    Related Meds:  Scheduled Meds:   . citalopram  20 mg Oral Daily  . dextrose  25 mL Intravenous Once  . docusate sodium  100 mg Oral BID  . DULoxetine  60 mg Oral Daily  . feeding supplement  30 mL Oral BID  . ferrous sulfate  325 mg Oral Q breakfast  . methimazole  5 mg Oral Daily  . metoprolol tartrate  12.5 mg Oral BID  . multivitamins ther. w/minerals  1 tablet Oral Daily  . pantoprazole  40 mg Oral Daily  . polyethylene glycol  17 g Oral BID  . polyvinyl alcohol  1 drop Both Eyes QID  . DISCONTD: methylcellulose  1 drop Both Eyes QID   Continuous Infusions:   . dextrose 5 % and 0.9% NaCl 100 mL/hr at 11/24/11 0152   PRN Meds:.acetaminophen, acetaminophen, albuterol, ipratropium, mineral oil, DISCONTD: ipratropium-albuterol  Ht:  175.3cm per previous admission notes on 11/12/11, currently no height from this admission in chart  Wt:  104.5kg per charting on 11/20/11  Ideal Wt:    72.7kg % Ideal Wt: 143  Usual Wt: [facility] states pt with stable weight  BMI : 34  Food/Nutrition Related Hx: Pt nonverbal, obtained history from discussion with [facility]. Pt previously admitted to [facility] on 11/12/11, discharged 11/20/11. [facility] staff state pt on a mechanical soft diet with honey thick liquids but pt with poor intake since 11/20/11. They state that prior to initial admission at [facility], pt eating  well with stable weight. Per SLP discussion with [facility] SLP, pt was vomiting all foods/liquids PTA and has history of PEG with TF that pt tolerated well, but no longer has PEG. SLP evaluation today noted pt trying to vomit after small amount of water given to pt, with recommendations for NPO. Noted family desires PEG.    DG of abdomen with chest on 11/26 showed low lung volumes. No active disease.   Labs:  CMP     Component Value Date/Time   NA 138 11/23/2011 1244   K 3.9 11/23/2011 1244   CL 106 11/23/2011 1244   CO2 23 11/23/2011 1244   GLUCOSE 69* 11/23/2011 1244   BUN 9 11/23/2011 1244   CREATININE 0.79 11/23/2011 1244   CALCIUM 9.2 11/23/2011 1244   CALCIUM 8.7 02/19/2011 1515   PROT 7.0 11/23/2011 1244   ALBUMIN 3.2* 11/23/2011 1244   AST 19 11/23/2011 1244   ALT 13 11/23/2011 1244   ALKPHOS 73 11/23/2011 1244   BILITOT 0.5 11/23/2011 1244   GFRNONAA >90 11/23/2011 1244   GFRAA >90 11/23/2011 1244   CBG (last 3)   Basename 11/24/11 0608 11/23/11 1931 11/23/11 1707  GLUCAP 81 123* 61*    Diet Order: NPO  IVF:  dextrose 5 % and 0.9% NaCl Last Rate: 100 mL/hr at 11/24/11 0152   11/16 weight: 98.4kg 11/23 weight: 104.5kg - Noted pt with 13 pound weight gain, unsure if this weight is accurate, or may be fluid related. Awaiting nursing to get pt's weight for this admission.   Estimated Nutritional Needs:   Kcal:1800-2200 Protein:90-100g Fluid:1.8-2.2L  NUTRITION DIAGNOSIS: -Inadequate oral intake (NI-2.1).  Status: Ongoing  RELATED TO: dysphagia, nausea, vomiting  AS EVIDENCE BY: H&P, NPO status  MONITORING/EVALUATION(Goals): Once PEG placed, for pt to tolerate TF and meet >90% of estimated nutritional needs.   EDUCATION NEEDS: -Education not appropriate at this time  INTERVENTION: Pt tolerated Vital 1.5 during previous admission, however currently unavailable in hospital due to shortage. Once PEG placed, recommend Vital 1.2 start at 75ml/hr increase  by 10ml q4hr as tolerated to goal of 109ml/hr, which will provide 1872 calories, 117g protein, 8g fiber, and free water. Diet per MD/SLP. Will continue to monitor.   Dietitian # [phone]  DOCUMENTATION CODES Per approved criteria  -Obesity Unspecified    Marshall Cork 11/24/2011, 4:32 PM

## 2011-11-24 NOTE — Consult Note (Signed)
Reason for Consult: seizure Referring Physician: Trula Ore Rama Fines [name] is an 45 y.o. male.  HPI:  [name] is an 45 y.o. male who is a resident of the [facility] skilled nursing facility. He was sent to the emergency department today because when the caretakers were trying to feed the patient today he apparently had some type of seizure-like activity where he began rolling his eyes, his arms and hands were shaking, his lips turned blue, and his face became contorted. This is according to the account of the caretaker at the facility. They sent him to the emergency department because they've been having more difficulty feeding him and they've never witnessed him having this type of an episode. 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 When he got into the emergency department he was found to be hypoglycemic with a blood sugar of 61 at the time. He was given dextrose IV and it corrected. The patient had recently been discharged approximately one week ago from the hospital for severe dehydration and other medical issues. It was noted that if the patient continued to have difficulty with feeding and dysphasia that a PEG tube should be considered. The patient has had a PEG tube in the past.  But can eat now. He has not ahd any more seizures since admission. No past h/o seizures. 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     Past Medical History  Diagnosis Date  . Addison disease   . Anoxic brain injury   . A-fib   . Seizure disorder   . Dysphagia   . History of recurrent UTIs   . Hyperlipemia   . Aspiration pneumonia   . Mental disorder   . Anxiety   . Reflux   . Dysphagia   . Glucocorticoid deficiency   . Atrial fibrillation   . Glaucoma   . Hypothyroidism   . Encephalopathy   . Quadriplegia   . Addison disease     Past Surgical History  Procedure Date  . Nephrectomy unknown    History  reviewed. No pertinent family history.  Social History:  reports that he has never smoked. He has never used smokeless tobacco. He reports that he does not drink alcohol or use illicit drugs.  Allergies:  Allergies  Allergen Reactions  . Latex Dermatitis  . Morphine And Related Other (See Comments)    UNKNOWN  . Penicillins Other (See Comments)    UNKNOWN  . Pyridium (Phenazopyridine Hcl) Other (See Comments)    UNKNOWN  . Soy Allergy Other (See Comments)    UNKNOWN    Medications: I have reviewed the patient's current medications.  Results for orders placed during the hospital encounter of 11/23/11 (from the past 48 hour(s))  CULTURE, BLOOD (ROUTINE X 2)     Status: Normal (Preliminary result)   Collection Time   11/23/11 12:40 PM      Component Value Range Comment   Specimen Description BLOOD RIGHT ARM      Special Requests BOTTLES DRAWN AEROBIC ONLY 2CC      Setup Time [id number]      Culture        Value:        BLOOD CULTURE RECEIVED NO GROWTH TO DATE CULTURE WILL BE HELD FOR 5 DAYS BEFORE ISSUING A FINAL NEGATIVE REPORT   Report Status PENDING     CBC     Status: Abnormal   Collection  Time   11/23/11 12:44 PM      Component Value Range Comment   WBC 7.4  4.0 - 10.5 (K/uL)    RBC 4.73  4.22 - 5.81 (MIL/uL)    Hemoglobin 13.8  13.0 - 17.0 (g/dL)    HCT 86.5  78.4 - 69.6 (%)    MCV 86.3  78.0 - 100.0 (fL)    MCH 29.2  26.0 - 34.0 (pg)    MCHC 33.8  30.0 - 36.0 (g/dL)    RDW 29.5  28.4 - 13.2 (%)    Platelets 122 (*) 150 - 400 (K/uL)   DIFFERENTIAL     Status: Normal   Collection Time   11/23/11 12:44 PM      Component Value Range Comment   Neutrophils Relative 62  43 - 77 (%)    Neutro Abs 4.6  1.7 - 7.7 (K/uL)    Lymphocytes Relative 26  12 - 46 (%)    Lymphs Abs 1.9  0.7 - 4.0 (K/uL)    Monocytes Relative 11  3 - 12 (%)    Monocytes Absolute 0.8  0.1 - 1.0 (K/uL)    Eosinophils Relative 1  0 - 5 (%)    Eosinophils Absolute 0.1  0.0 - 0.7 (K/uL)     Basophils Relative 0  0 - 1 (%)    Basophils Absolute 0.0  0.0 - 0.1 (K/uL)   COMPREHENSIVE METABOLIC PANEL     Status: Abnormal   Collection Time   11/23/11 12:44 PM      Component Value Range Comment   Sodium 138  135 - 145 (mEq/L)    Potassium 3.9  3.5 - 5.1 (mEq/L)    Chloride 106  96 - 112 (mEq/L)    CO2 23  19 - 32 (mEq/L)    Glucose, Bld 69 (*) 70 - 99 (mg/dL)    BUN 9  6 - 23 (mg/dL)    Creatinine, Ser 4.40  0.50 - 1.35 (mg/dL)    Calcium 9.2  8.4 - 10.5 (mg/dL)    Total Protein 7.0  6.0 - 8.3 (g/dL)    Albumin 3.2 (*) 3.5 - 5.2 (g/dL)    AST 19  0 - 37 (U/L)    ALT 13  0 - 53 (U/L)    Alkaline Phosphatase 73  39 - 117 (U/L)    Total Bilirubin 0.5  0.3 - 1.2 (mg/dL)    GFR calc non Af Amer >90  >90 (mL/min)    GFR calc Af Amer >90  >90 (mL/min)   LIPASE, BLOOD     Status: Normal   Collection Time   11/23/11 12:44 PM      Component Value Range Comment   Lipase 33  11 - 59 (U/L)   CULTURE, BLOOD (ROUTINE X 2)     Status: Normal (Preliminary result)   Collection Time   11/23/11 12:44 PM      Component Value Range Comment   Specimen Description BLOOD LEFT PICC      Special Requests BOTTLES DRAWN AEROBIC AND ANAEROBIC 6CC      Setup Time [id number]      Culture        Value:        BLOOD CULTURE RECEIVED NO GROWTH TO DATE CULTURE WILL BE HELD FOR 5 DAYS BEFORE ISSUING A FINAL NEGATIVE REPORT   Report Status PENDING     LACTIC ACID, PLASMA     Status: Normal   Collection Time  11/23/11 12:44 PM      Component Value Range Comment   Lactic Acid, Venous 0.9  0.5 - 2.2 (mmol/L)   URINALYSIS, ROUTINE W REFLEX MICROSCOPIC     Status: Abnormal   Collection Time   11/23/11  1:09 PM      Component Value Range Comment   Color, Urine AMBER (*) YELLOW  BIOCHEMICALS MAY BE AFFECTED BY COLOR   Appearance CLEAR  CLEAR     Specific Gravity, Urine 1.025  1.005 - 1.030     pH 5.5  5.0 - 8.0     Glucose, UA NEGATIVE  NEGATIVE (mg/dL)    Hgb urine dipstick NEGATIVE  NEGATIVE      Bilirubin Urine SMALL (*) NEGATIVE     Ketones, ur 40 (*) NEGATIVE (mg/dL)    Protein, ur NEGATIVE  NEGATIVE (mg/dL)    Urobilinogen, UA 1.0  0.0 - 1.0 (mg/dL)    Nitrite NEGATIVE  NEGATIVE     Leukocytes, UA NEGATIVE  NEGATIVE  MICROSCOPIC NOT DONE ON URINES WITH NEGATIVE PROTEIN, BLOOD, LEUKOCYTES, NITRITE, OR GLUCOSE <1000 mg/dL.  TROPONIN I     Status: Normal   Collection Time   11/23/11  1:55 PM      Component Value Range Comment   Troponin I <0.30  <0.30 (ng/mL)   GLUCOSE, CAPILLARY     Status: Abnormal   Collection Time   11/23/11  5:07 PM      Component Value Range Comment   Glucose-Capillary 61 (*) 70 - 99 (mg/dL)    Comment 1 Documented in Chart      Comment 2 Notify RN     GLUCOSE, CAPILLARY     Status: Abnormal   Collection Time   11/23/11  7:31 PM      Component Value Range Comment   Glucose-Capillary 123 (*) 70 - 99 (mg/dL)    Comment 1 Notify RN     CBC     Status: Abnormal   Collection Time   11/24/11  5:15 AM      Component Value Range Comment   WBC 5.3  4.0 - 10.5 (K/uL)    RBC 4.38  4.22 - 5.81 (MIL/uL)    Hemoglobin 12.6 (*) 13.0 - 17.0 (g/dL)    HCT 16.1 (*) 09.6 - 52.0 (%)    MCV 86.8  78.0 - 100.0 (fL)    MCH 28.8  26.0 - 34.0 (pg)    MCHC 33.2  30.0 - 36.0 (g/dL)    RDW 04.5  40.9 - 81.1 (%)    Platelets 121 (*) 150 - 400 (K/uL)   PROTIME-INR     Status: Normal   Collection Time   11/24/11  5:15 AM      Component Value Range Comment   Prothrombin Time 14.6  11.6 - 15.2 (seconds)    INR 1.12  0.00 - 1.49    MAGNESIUM     Status: Normal   Collection Time   11/24/11  5:15 AM      Component Value Range Comment   Magnesium 1.6  1.5 - 2.5 (mg/dL)   HEMOGLOBIN B1Y     Status: Normal   Collection Time   11/24/11  5:15 AM      Component Value Range Comment   Hemoglobin A1C 4.6  <5.7 (%)    Mean Plasma Glucose 85  <117 (mg/dL)   GLUCOSE, CAPILLARY     Status: Normal   Collection Time   11/24/11  6:08 AM  Component Value Range Comment    Glucose-Capillary 81  70 - 99 (mg/dL)   MRSA PCR SCREENING     Status: Normal   Collection Time   11/24/11  6:57 PM      Component Value Range Comment   MRSA by PCR NEGATIVE  NEGATIVE      Dg Abd Acute W/chest  11/23/2011  *RADIOLOGY REPORT*  Clinical Data: Abdominal pain, nausea, vomiting.  ACUTE ABDOMEN SERIES (ABDOMEN 2 VIEW & CHEST 1 VIEW)  Comparison: 10/12/2011  Findings: Low lung volumes.  Lungs are clear.  Heart is normal size.  Left PICC line is in place with the tip in the SVC.  IMPRESSION: Low lung volumes.  No active disease.  Original Report Authenticated By: Cyndie Chime, M.D.   CT-scan of the brain mild small vessel disease . No acute abnormalities.   ROS positive for recent hospitalization for hypernatremia, recurrent UTIs, swallowing, gait difficulties. Blood pressure 117/77, pulse 56, temperature 97.5 F (36.4 C), temperature source Oral, resp. rate 18, SpO2 100.00%.  PHYSEXAM  Gen. exam-the patient is generally deconditioned and in a fetal position in the exam bed, he is awake and alert and he does not yes or no to specific questions and appears to be in no distress at this time.  HEENT-normocephalic, mucous membranes moist and pink  Neck-supple, no thyromegaly or goiter noted, no JVD  Lungs bilateral press sounds clear auscultation but shallow  CV-normal S1-S2 sounds noted  Abdomen-obese, soft, bowel sounds present, no masses palpated, healed old scars  Extremities-no pretibial edema noted, no cyanosis noted. Neurological exam Awake  Alert oriented x 2 Slow hesitant speech with hyphophonia and nonfluent speech with speaking few words only.[place] movements full without nystagmus. Face symmetric. Tongue midline. Normal strength, tone, reflexes  But coordination difficult to assess due to lack of understanding. Withdraws all four limbs to pain well. Gait deferred but per RN severe truncal ataxia needing 2 person assisit.  Assessment   45 year male with solitary episode of  brief altered consciousness in setting of low glucose  Seizure versus syncopal episode.He has h/o anoxic brain injury which places him at high risk for seizures but this episode may have been triggered by low glucose.  Plan ; Check EEG.No need for anticonvulsant for now unless has more unprovoked episodes in the future. Hold of fon MRI also unless he has more spells. Call for questions.  Sherra Kimmons,PRAMODKUMAR P 11/24/2011, 10:12 PM

## 2011-11-24 NOTE — ED Provider Notes (Signed)
Medical screening examination/treatment/procedure(s) were performed by non-physician practitioner and as supervising physician I was immediately available for consultation/collaboration.   Rahkim Rabalais A. Patrica Duel, MD 11/24/11 [phone]

## 2011-11-24 NOTE — Progress Notes (Signed)
Physical Therapy Evaluation Patient Details Name: [name] MRN: [id number] DOB: Jul 08, 1966 Today's Date: 11/24/2011 14:10-14:20 EVII  PT Recommendation: Continued PT at SNF  Problem List:  Patient Active Problem List  Diagnoses  . Dysphagia  . Anoxic brain injury  . Hypernatremia  . Acute renal failure  . Protein calorie malnutrition  . Hyperchloremia  . Hyperthyroidism  . UTI (lower urinary tract infection)  . Grand mal seizure  . Depression (emotion)  . History of atrial fibrillation  . Other specified hemorrhagic conditions  . Altered mental status  . Glaucoma  . Hypoglycemia  . Chronic constipation    Past Medical History:  Past Medical History  Diagnosis Date  . Addison disease   . Anoxic brain injury   . A-fib   . Seizure disorder   . Dysphagia   . History of recurrent UTIs   . Hyperlipemia   . Aspiration pneumonia   . Mental disorder   . Anxiety   . Reflux   . Dysphagia   . Glucocorticoid deficiency   . Atrial fibrillation   . Glaucoma   . Hypothyroidism   . Encephalopathy   . Quadriplegia   . Addison disease    Past Surgical History:  Past Surgical History  Procedure Date  . Nephrectomy unknown    PT Assessment/Plan/Recommendation PT Assessment Clinical Impression Statement: pt continues to have deconditioning with muscle atrophy evident and decreased funtional mobility  PT Recommendation/Assessment: Patient will need skilled PT in the acute care venue PT Problem List: Decreased strength;Decreased activity tolerance;Decreased balance;Decreased mobility;Decreased cognition;Decreased safety awareness (impulsivity) Problem List Comments: these problems were present prior to this admission PT Therapy Diagnosis : Difficulty walking;Generalized weakness PT Plan PT Frequency: Min 3X/week PT Treatment/Interventions: DME instruction;Gait training;Functional mobility training;Balance training;Therapeutic exercise;Patient/family education PT  Recommendation Recommendations for Other Services: OT consult Follow Up Recommendations: Skilled nursing facility Equipment Recommended: Defer to next venue PT Goals  Acute Rehab PT Goals PT Goal Formulation: Patient unable to participate in goal setting Time For Goal Achievement: 2 weeks Pt will go Supine/Side to Sit: with min assist PT Goal: Supine/Side to Sit - Progress: Not met Pt will Transfer Sit to Stand/Stand to Sit: with min assist PT Transfer Goal: Sit to Stand/Stand to Sit - Progress: Not met Pt will Ambulate: 51 - 150 feet;with least restrictive assistive device PT Goal: Ambulate - Progress: Not met  PT Evaluation Precautions/Restrictions    Prior Functioning  Home Living Type of Home: Skilled Nursing Facility   Cognition Cognition Arousal/Alertness: Awake/alert Overall Cognitive Status: Difficult to assess Difficult to assess due to: impaired communication Orientation Level:  (unable to assess) Safety/Judgement: Decreased safety judgement for tasks assessed Decreased Safety/Judgement: Impulsive Safety/Judgement - Other Comments: pt without regard for precaution for PICC line Awareness of Deficits: Decreased awareness of deficits Problem Solving: Requires assistance for problem solving Sensation/Coordination   Extremity Assessment RUE Assessment RUE Assessment: Exceptions to [facility] RUE PROM (degrees) Overall PROM Right Upper Extremity: Deficits (appears to have edema in right upper arm with decreased PROM) RLE Assessment RLE Assessment: Exceptions to [facility] RLE AROM (degrees) Overall AROM Right Lower Extremity: Due to decreased strength (diffuse muscle atrophy throughout) LLE Assessment LLE Assessment: Exceptions to [facility] (generalized muscle atrophy) Mobility (including Balance) Bed Mobility Bed Mobility: Yes Rolling Right: 4: Min assist Rolling Right Details (indicate cue type and reason): pt impulsive Rolling Left: 4: Min assist Rolling Left Details (indicate  cue type and reason): impulisivity noted Left Sidelying to Sit: 3: Mod assist (difficulty pushing upper  body up from bed) Left Sidelying to Sit Details (indicate cue type and reason): needs assist to drop legs off and lift upper body up from bed Supine to Sit: 3: Mod assist Sit to Supine - Right: 1: +2 Total assist (+2 total Assist pt = 50%) Sit to Supine - Left: 1: +2 Total assist (pt = 50%) Transfers Sit to Stand: 1: +2 Total assist;Patient percentage (comment) (pt= 50%) Stand to Sit: 1: +2 Total assist;Patient percentage (comment) (pt =50%) Ambulation/Gait Ambulation/Gait: No  Posture/Postural Control Posture/Postural Control: No significant limitations Static Sitting Balance Static Sitting - Balance Support: Bilateral upper extremity supported;Feet supported Static Sitting - Level of Assistance: 4: Min assist Static Sitting - Comment/# of Minutes: pt needs cues to keep trunk extended Exercise    End of Session PT - End of Session Equipment Utilized During Treatment:  Adult nurse) Activity Tolerance: Patient limited by fatigue Patient left: in bed;with bed alarm set;with call bell in reach Nurse Communication: Mobility status for transfers;Mobility status for ambulation General Behavior During Session: Flat affect (pt has minor restlessness of arms and legs) Cognition: Impaired, at baseline  Donnetta Hail 11/24/2011, 3:35 PM

## 2011-11-24 NOTE — Progress Notes (Signed)
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  He was mildly hypoglycemic on initial evaluation. ROS: [name] is restless and complaining of groin pain.  He is breathing heavy.     Objective: Vital signs in last 24 hours: Temp:  [97.5 F (36.4 C)-98.1 F (36.7 C)] 97.5 F (36.4 C) (11/27 1351) Pulse Rate:  [56-73] 56  (11/27 1351) Resp:  [16-18] 18  (11/27 1351) BP: (99-117)/(66-77) 117/77 mmHg (11/27 1351) SpO2:  [95 %-100 %] 100 % (11/27 1351) Weight change:     Intake/Output from previous day: No intake or output data in the 24 hours ending 11/24/11 1731   Physical Exam:  Gen:  Restless Cardiovascular:  RRR, No M/R/G Respiratory: Lungs CTAB Gastrointestinal: Abdomen soft.  Mild suprapubic tenderness.  BS present x 4. Extremities: No C/E/C Reproductive: Penis and scrotum normal appearing.   Lab Results: Basic Metabolic Panel:  Lab 11/24/11 1610 11/23/11 1244 11/20/11 0330 11/19/11 0845 11/18/11 0545  NA -- 138 138 139 143  K -- 3.9 3.4* -- --  CL -- 106 107 110 113*  CO2 -- 23 23 19 23   GLUCOSE -- 69* 68* 76 69*  BUN -- 9 5* 6 7  CREATININE -- 0.79 0.68 0.61 0.69  CALCIUM -- 9.2 8.5 8.7 8.6  MG 1.6 -- -- -- --  PHOS -- -- -- -- --   Liver Function Tests:  Lab 11/23/11 1244  AST 19  ALT 13  ALKPHOS 73  BILITOT 0.5  PROT 7.0  ALBUMIN 3.2*    Lab 11/23/11 1244  LIPASE 33  AMYLASE --  CBC:  Lab 11/24/11 0515 11/23/11 1244 11/20/11 0330 11/19/11 0845 11/18/11 0545  WBC 5.3 7.4 4.6 4.4 4.2  NEUTROABS -- 4.6 -- -- --  HGB 12.6* 13.8 12.0* 13.1 12.2*  HCT 38.0* 40.8 35.6* 37.4* 36.3*  MCV 86.8 86.3 85.4 84.2 86.4  PLT 121* 122* 69* 68* 56*   Cardiac Enzymes:  Lab 11/23/11 1355  CKTOTAL --  CKMB --  CKMBINDEX --  TROPONINI <0.30   BNP: No results found for this basename: POCBNP:5 in the last 168 hours CBG:  Lab  11/24/11 0608 11/23/11 1931 11/23/11 1707  GLUCAP 81 123* 61*   Hgb A1c  Basename 11/24/11 0515  HGBA1C 4.6    Recent Results (from the past 240 hour(s))  CULTURE, BLOOD (ROUTINE X 2)     Status: Normal (Preliminary result)   Collection Time   11/23/11 12:40 PM      Component Value Range Status Comment   Specimen Description BLOOD RIGHT ARM   Final    Special Requests BOTTLES DRAWN AEROBIC ONLY 2CC   Final    Setup Time [id number]   Final    Culture     Final    Value:        BLOOD CULTURE RECEIVED NO GROWTH TO DATE CULTURE WILL BE HELD FOR 5 DAYS BEFORE ISSUING A FINAL NEGATIVE REPORT   Report Status PENDING   Incomplete   CULTURE, BLOOD (ROUTINE X 2)     Status: Normal (Preliminary result)   Collection Time   11/23/11 12:44 PM      Component Value Range Status Comment   Specimen Description BLOOD LEFT PICC   Final    Special Requests BOTTLES DRAWN AEROBIC AND ANAEROBIC 6CC   Final    Setup Time  [id number]   Final    Culture     Final    Value:        BLOOD CULTURE RECEIVED NO GROWTH TO DATE CULTURE WILL BE HELD FOR 5 DAYS BEFORE ISSUING A FINAL NEGATIVE REPORT   Report Status PENDING   Incomplete     Studies/Results: Dg Abd Acute W/chest  11/23/2011  *RADIOLOGY REPORT*  Clinical Data: Abdominal pain, nausea, vomiting.  ACUTE ABDOMEN SERIES (ABDOMEN 2 VIEW & CHEST 1 VIEW)  Comparison: 10/12/2011  Findings: Low lung volumes.  Lungs are clear.  Heart is normal size.  Left PICC line is in place with the tip in the SVC.  IMPRESSION: Low lung volumes.  No active disease.  Original Report Authenticated By: Cyndie Chime, M.D.    Medications: Scheduled Meds:   . citalopram  20 mg Oral Daily  . docusate sodium  100 mg Oral BID  . DULoxetine  60 mg Oral Daily  . feeding supplement  30 mL Oral BID  . ferrous sulfate  325 mg Oral Q breakfast  . methimazole  5 mg Oral Daily  . metoprolol tartrate  12.5 mg Oral BID  . multivitamins ther. w/minerals  1 tablet Oral Daily  .  pantoprazole  40 mg Oral Daily  . polyethylene glycol  17 g Oral BID  . polyvinyl alcohol  1 drop Both Eyes QID  . DISCONTD: methylcellulose  1 drop Both Eyes QID   Continuous Infusions:   . dextrose 5 % and 0.9% NaCl 100 mL/hr at 11/24/11 0152   PRN Meds:.acetaminophen, acetaminophen, albuterol, ipratropium, mineral oil, DISCONTD: ipratropium-albuterol  Assessment/Plan:  Principal Problem:  *Hypoglycemia Continue dextrose infusion. Active Problems:  Grand mal seizure I have requested a neurology consultation.  Depression (emotion) Continue celexa/cymbalta.  History of atrial fibrillation Currently not on anticoagulation because of history of bleeding problems  Other specified hemorrhagic conditions H&H stable.  Altered mental status Unclear baseline, but currently alert and answering questions appropriately.  Chronic constipation Monitor bowel function.    LOS: 1 day   Steven Aldo, MD Pager [phone]  11/24/2011, 5:31 PM

## 2011-11-25 ENCOUNTER — Other Ambulatory Visit (HOSPITAL_COMMUNITY): Payer: Medicare Other

## 2011-11-25 DIAGNOSIS — R627 Adult failure to thrive: Secondary | ICD-10-CM | POA: Diagnosis present

## 2011-11-25 DIAGNOSIS — E876 Hypokalemia: Secondary | ICD-10-CM | POA: Diagnosis not present

## 2011-11-25 DIAGNOSIS — D696 Thrombocytopenia, unspecified: Secondary | ICD-10-CM | POA: Diagnosis present

## 2011-11-25 LAB — GLUCOSE, CAPILLARY
Glucose-Capillary: 63 mg/dL — ABNORMAL LOW (ref 70–99)
Glucose-Capillary: 64 mg/dL — ABNORMAL LOW (ref 70–99)
Glucose-Capillary: 78 mg/dL (ref 70–99)
Glucose-Capillary: 90 mg/dL (ref 70–99)

## 2011-11-25 LAB — URINE CULTURE
Colony Count: NO GROWTH
Culture  Setup Time: 201211270452
Culture: NO GROWTH

## 2011-11-25 LAB — COMPREHENSIVE METABOLIC PANEL
ALT: 11 U/L (ref 0–53)
AST: 17 U/L (ref 0–37)
Albumin: 3 g/dL — ABNORMAL LOW (ref 3.5–5.2)
CO2: 21 mEq/L (ref 19–32)
Calcium: 8.8 mg/dL (ref 8.4–10.5)
Chloride: 114 mEq/L — ABNORMAL HIGH (ref 96–112)
Creatinine, Ser: 0.88 mg/dL (ref 0.50–1.35)
Sodium: 145 mEq/L (ref 135–145)

## 2011-11-25 MED ORDER — DEXTROSE 50 % IV SOLN
INTRAVENOUS | Status: AC
  Administered 2011-11-25: 25 mL
  Filled 2011-11-25: qty 50

## 2011-11-25 MED ORDER — VANCOMYCIN HCL 1000 MG IV SOLR
1500.0000 mg | Freq: Once | INTRAVENOUS | Status: DC
  Filled 2011-11-25: qty 1500

## 2011-11-25 MED ORDER — ZOLPIDEM TARTRATE 5 MG PO TABS
5.0000 mg | ORAL_TABLET | Freq: Every evening | ORAL | Status: DC | PRN
  Filled 2011-11-25: qty 1

## 2011-11-25 MED ORDER — VANCOMYCIN HCL 1000 MG IV SOLR
1500.0000 mg | Freq: Once | INTRAVENOUS | Status: AC
  Administered 2011-11-25: 1500 mg via INTRAVENOUS
  Filled 2011-11-25: qty 1500

## 2011-11-25 MED ORDER — DEXTROSE 50 % IV SOLN
25.0000 mL | Freq: Once | INTRAVENOUS | Status: AC | PRN
  Administered 2011-11-25: 25 mL via INTRAVENOUS
  Filled 2011-11-25: qty 50

## 2011-11-25 MED ORDER — DEXTROSE 50 % IV SOLN
INTRAVENOUS | Status: AC
  Filled 2011-11-25: qty 50

## 2011-11-25 MED ORDER — POTASSIUM CHLORIDE CRYS ER 20 MEQ PO TBCR
20.0000 meq | EXTENDED_RELEASE_TABLET | Freq: Two times a day (BID) | ORAL | Status: DC
  Administered 2011-11-25: 20 meq via ORAL
  Filled 2011-11-25 (×4): qty 1

## 2011-11-25 MED ORDER — PNEUMOCOCCAL VAC POLYVALENT 25 MCG/0.5ML IJ INJ
0.5000 mL | INJECTION | Freq: Once | INTRAMUSCULAR | Status: DC
  Filled 2011-11-25: qty 0.5

## 2011-11-25 MED ORDER — DEXTROSE 50 % IV SOLN: 25.0000 mL | Freq: Once | INTRAVENOUS | Status: AC | PRN

## 2011-11-25 NOTE — Progress Notes (Signed)
CRITICAL VALUE ALERT  Critical value received:  57  Date of notification:  11/25/2011  Time of notification:  1500  Critical value read back:yes  Nurse who received alert:  Darlen Gledhill, Illene Silver  MD notified (1st page):DR Rama  Time of first page:  1506  MD notified (2nd page):  Time of second page:  Responding MD:  DR Darnelle Catalan  Time MD responded:  Dr Darnelle Catalan

## 2011-11-25 NOTE — Progress Notes (Addendum)
Patient is from [facility] as a Long term care resident. CSW met w/pt. Pt was unable to communicate with CSW. CSW called patients mother/guardian, Mayfield Schoene, (h) [phone], (C) [phone]. She s/that pt is frm [facility]. She would like CSW to look into transferring pt near her to [facility] in [place] [phone]. If pt is unable to transfer there directly from the hospital, she is agreeable to pt returning to [facility]. CSW called [facility] and spoke w/Angela Freida Busman. CSW faxed all needed information to [employer]. Pending acceptance.  Zayanna Pundt C. Eevie Lapp MSW, [place] [phone]   FL-2 completed and placed on shadow chart for doctor signature.

## 2011-11-25 NOTE — H&P (Addendum)
[name] is an 45 y.o. male.   Chief Complaint: Failure to thrive HPI: Asked to see this 45 yo male quadriplegic with anoxic brain injury, dysphagia, and failure to thrive for placement of a gastrostomy tube.  Pt's mother contacted by phone and informed consent was obtained.  Past Medical History  Diagnosis Date  . Addison disease   . Anoxic brain injury   . A-fib   . Seizure disorder   . Dysphagia   . History of recurrent UTIs   . Hyperlipemia   . Aspiration pneumonia   . Mental disorder   . Anxiety   . Reflux   . Dysphagia   . Glucocorticoid deficiency   . Atrial fibrillation   . Glaucoma   . Hypothyroidism   . Encephalopathy   . Quadriplegia   . Addison disease     Past Surgical History  Procedure Date  . Nephrectomy unknown    History reviewed. No pertinent family history. Social History:  reports that he has never smoked. He has never used smokeless tobacco. He reports that he does not drink alcohol or use illicit drugs.  Allergies:  Allergies  Allergen Reactions  . Latex Dermatitis  . Morphine And Related Other (See Comments)    UNKNOWN  . Penicillins Other (See Comments)    UNKNOWN  . Pyridium (Phenazopyridine Hcl) Other (See Comments)    UNKNOWN  . Soy Allergy Other (See Comments)    UNKNOWN    Medications Prior to Admission  Medication Dose Route Frequency Provider Last Rate Last Dose  . acetaminophen (TYLENOL) tablet 650 mg  650 mg Oral Q6H PRN Clanford Cyndie Mull, MD       Or  . acetaminophen (TYLENOL) suppository 650 mg  650 mg Rectal Q6H PRN Clanford L Johnson, MD      . albuterol (PROVENTIL) (5 MG/ML) 0.5% nebulizer solution 2.5 mg  2.5 mg Nebulization Q6H PRN Lorenza Evangelist, PHARMD      . citalopram (CELEXA) tablet 20 mg  20 mg Oral Daily Clanford L Johnson, MD      . dextrose 5 %-0.9 % sodium chloride infusion   Intravenous Continuous Rise Patience, PA 100 mL/hr at 11/24/11 2123    . dextrose 50 % solution 25 mL  25 mL Intravenous Once  Rise Patience, PA   25 mL at 11/23/11 1723  . dextrose 50 % solution        25 mL at 11/25/11 0757  . docusate sodium (COLACE) capsule 100 mg  100 mg Oral BID Clanford L Johnson, MD      . DULoxetine (CYMBALTA) DR capsule 60 mg  60 mg Oral Daily Clanford L Johnson, MD      . feeding supplement (PRO-STAT SUGAR FREE 64) liquid 30 mL  30 mL Oral BID Clanford L Johnson, MD      . ferrous sulfate tablet 325 mg  325 mg Oral Q breakfast Clanford L Johnson, MD      . ipratropium (ATROVENT) nebulizer solution 0.5 mg  0.5 mg Nebulization Q6H PRN Lorenza Evangelist, PHARMD      . methimazole (TAPAZOLE) tablet 5 mg  5 mg Oral Daily Clanford L Johnson, MD      . metoprolol tartrate (LOPRESSOR) tablet 12.5 mg  12.5 mg Oral BID Clanford Cyndie Mull, MD      . mineral oil enema 1 enema  1 enema Rectal PRN Clanford Cyndie Mull, MD      . multivitamins ther. w/minerals tablet 1  tablet  1 tablet Oral Daily Clanford L Johnson, MD      . ondansetron [facility]) injection 4 mg  4 mg Intravenous Once Rise Patience, PA   4 mg at 11/23/11 1347  . ondansetron (ZOFRAN) injection 4 mg  4 mg Intravenous Q6H PRN Ramiro Harvest   4 mg at 11/24/11 1813  . pantoprazole (PROTONIX) EC tablet 40 mg  40 mg Oral Daily Clanford L Johnson, MD      . pneumococcal 23 valent vaccine (PNU-IMMUNE) injection 0.5 mL  0.5 mL Intramuscular Once [facility]      . polyethylene glycol (MIRALAX / GLYCOLAX) packet 17 g  17 g Oral BID Clanford L Johnson, MD      . polyvinyl alcohol (LIQUIFILM TEARS) 1.4 % ophthalmic solution 1 drop  1 drop Both Eyes QID Lorenza Evangelist, PHARMD   1 drop at 11/24/11 2253  . potassium chloride SA (K-DUR,KLOR-CON) CR tablet 20 mEq  20 mEq Oral BID Christina Rama      . sodium chloride 0.9 % bolus 1,000 mL  1,000 mL Intravenous Once Rise Patience, PA   1,000 mL at 11/23/11 1345  . zolpidem (AMBIEN) tablet 5 mg  5 mg Oral QHS PRN Vania [name]      . DISCONTD: ipratropium-albuterol (DUONEB) 0.5-2.5 (3) MG/3ML nebulizer  solution 3 mL  3 mL Nebulization Q6H PRN Clanford L Johnson, MD      . DISCONTD: methylcellulose (ARTIFICIAL TEARS) 1 % ophthalmic solution 1 drop  1 drop Both Eyes QID Clanford Cyndie Mull, MD       Medications Prior to Admission  Medication Sig Dispense Refill  . citalopram (CELEXA) 20 MG tablet Take 20 mg by mouth daily.        Marland Kitchen docusate sodium (COLACE) 100 MG capsule Take 100 mg by mouth 2 (two) times daily.        . DULoxetine (CYMBALTA) 60 MG capsule Take 60 mg by mouth daily.        . feeding supplement (PRO-STAT SUGAR FREE 64) LIQD Take 30 mLs by mouth 2 (two) times daily.        . ferrous sulfate 325 (65 FE) MG tablet Take 325 mg by mouth 3 (three) times daily with meals.        . fish oil-omega-3 fatty acids 1000 MG capsule Take 1 g by mouth 2 (two) times daily.        Marland Kitchen ipratropium-albuterol (DUONEB) 0.5-2.5 (3) MG/3ML SOLN Take 3 mLs by nebulization every 6 (six) hours as needed. SHORTNESS OF BREATH       . methimazole (TAPAZOLE) 5 MG tablet Take 5 mg by mouth daily.        . methylcellulose (ARTIFICIAL TEARS) 1 % ophthalmic solution Place 1 drop into both eyes 4 (four) times daily.        . metoprolol tartrate (LOPRESSOR) 25 MG tablet Take 0.5 tablets (12.5 mg total) by mouth 2 (two) times daily. Hold for systolic blood pressure less than 110 or heart rate less than 60  1 tablet  0  . mineral oil enema Place 1 enema rectally as needed. FOR CONSTIPATION       . pantoprazole (PROTONIX) 40 MG tablet Take 40 mg by mouth daily. USES PACKET, SO MUST CRUSH TABLETS       . polyethylene glycol (MIRALAX / GLYCOLAX) packet Take 17 g by mouth 2 (two) times daily.          Results for orders placed during  the hospital encounter of 11/23/11 (from the past 48 hour(s))  CULTURE, BLOOD (ROUTINE X 2)     Status: Normal (Preliminary result)   Collection Time   11/23/11 12:40 PM      Component Value Range Comment   Specimen Description BLOOD RIGHT ARM      Special Requests BOTTLES DRAWN AEROBIC  ONLY 2CC      Setup Time [id number]      Culture        Value:        BLOOD CULTURE RECEIVED NO GROWTH TO DATE CULTURE WILL BE HELD FOR 5 DAYS BEFORE ISSUING A FINAL NEGATIVE REPORT   Report Status PENDING     CBC     Status: Abnormal   Collection Time   11/23/11 12:44 PM      Component Value Range Comment   WBC 7.4  4.0 - 10.5 (K/uL)    RBC 4.73  4.22 - 5.81 (MIL/uL)    Hemoglobin 13.8  13.0 - 17.0 (g/dL)    HCT 81.1  91.4 - 78.2 (%)    MCV 86.3  78.0 - 100.0 (fL)    MCH 29.2  26.0 - 34.0 (pg)    MCHC 33.8  30.0 - 36.0 (g/dL)    RDW 95.6  21.3 - 08.6 (%)    Platelets 122 (*) 150 - 400 (K/uL)   DIFFERENTIAL     Status: Normal   Collection Time   11/23/11 12:44 PM      Component Value Range Comment   Neutrophils Relative 62  43 - 77 (%)    Neutro Abs 4.6  1.7 - 7.7 (K/uL)    Lymphocytes Relative 26  12 - 46 (%)    Lymphs Abs 1.9  0.7 - 4.0 (K/uL)    Monocytes Relative 11  3 - 12 (%)    Monocytes Absolute 0.8  0.1 - 1.0 (K/uL)    Eosinophils Relative 1  0 - 5 (%)    Eosinophils Absolute 0.1  0.0 - 0.7 (K/uL)    Basophils Relative 0  0 - 1 (%)    Basophils Absolute 0.0  0.0 - 0.1 (K/uL)   COMPREHENSIVE METABOLIC PANEL     Status: Abnormal   Collection Time   11/23/11 12:44 PM      Component Value Range Comment   Sodium 138  135 - 145 (mEq/L)    Potassium 3.9  3.5 - 5.1 (mEq/L)    Chloride 106  96 - 112 (mEq/L)    CO2 23  19 - 32 (mEq/L)    Glucose, Bld 69 (*) 70 - 99 (mg/dL)    BUN 9  6 - 23 (mg/dL)    Creatinine, Ser 5.78  0.50 - 1.35 (mg/dL)    Calcium 9.2  8.4 - 10.5 (mg/dL)    Total Protein 7.0  6.0 - 8.3 (g/dL)    Albumin 3.2 (*) 3.5 - 5.2 (g/dL)    AST 19  0 - 37 (U/L)    ALT 13  0 - 53 (U/L)    Alkaline Phosphatase 73  39 - 117 (U/L)    Total Bilirubin 0.5  0.3 - 1.2 (mg/dL)    GFR calc non Af Amer >90  >90 (mL/min)    GFR calc Af Amer >90  >90 (mL/min)   LIPASE, BLOOD     Status: Normal   Collection Time   11/23/11 12:44 PM      Component Value Range  Comment   Lipase 33  11 - 59 (U/L)   CULTURE, BLOOD (ROUTINE X 2)     Status: Normal (Preliminary result)   Collection Time   11/23/11 12:44 PM      Component Value Range Comment   Specimen Description BLOOD LEFT PICC      Special Requests BOTTLES DRAWN AEROBIC AND ANAEROBIC 6CC      Setup Time [id number]      Culture        Value:        BLOOD CULTURE RECEIVED NO GROWTH TO DATE CULTURE WILL BE HELD FOR 5 DAYS BEFORE ISSUING A FINAL NEGATIVE REPORT   Report Status PENDING     LACTIC ACID, PLASMA     Status: Normal   Collection Time   11/23/11 12:44 PM      Component Value Range Comment   Lactic Acid, Venous 0.9  0.5 - 2.2 (mmol/L)   URINALYSIS, ROUTINE W REFLEX MICROSCOPIC     Status: Abnormal   Collection Time   11/23/11  1:09 PM      Component Value Range Comment   Color, Urine AMBER (*) YELLOW  BIOCHEMICALS MAY BE AFFECTED BY COLOR   Appearance CLEAR  CLEAR     Specific Gravity, Urine 1.025  1.005 - 1.030     pH 5.5  5.0 - 8.0     Glucose, UA NEGATIVE  NEGATIVE (mg/dL)    Hgb urine dipstick NEGATIVE  NEGATIVE     Bilirubin Urine SMALL (*) NEGATIVE     Ketones, ur 40 (*) NEGATIVE (mg/dL)    Protein, ur NEGATIVE  NEGATIVE (mg/dL)    Urobilinogen, UA 1.0  0.0 - 1.0 (mg/dL)    Nitrite NEGATIVE  NEGATIVE     Leukocytes, UA NEGATIVE  NEGATIVE  MICROSCOPIC NOT DONE ON URINES WITH NEGATIVE PROTEIN, BLOOD, LEUKOCYTES, NITRITE, OR GLUCOSE <1000 mg/dL.  URINE CULTURE     Status: Normal   Collection Time   11/23/11  1:09 PM      Component Value Range Comment   Specimen Description URINE, CATHETERIZED      Special Requests Normal      Setup Time [id number]      Colony Count NO GROWTH      Culture NO GROWTH      Report Status 11/25/2011 FINAL     TROPONIN I     Status: Normal   Collection Time   11/23/11  1:55 PM      Component Value Range Comment   Troponin I <0.30  <0.30 (ng/mL)   GLUCOSE, CAPILLARY     Status: Abnormal   Collection Time   11/23/11  5:07 PM       Component Value Range Comment   Glucose-Capillary 61 (*) 70 - 99 (mg/dL)    Comment 1 Documented in Chart      Comment 2 Notify RN     GLUCOSE, CAPILLARY     Status: Abnormal   Collection Time   11/23/11  7:31 PM      Component Value Range Comment   Glucose-Capillary 123 (*) 70 - 99 (mg/dL)    Comment 1 Notify RN     CBC     Status: Abnormal   Collection Time   11/24/11  5:15 AM      Component Value Range Comment   WBC 5.3  4.0 - 10.5 (K/uL)    RBC 4.38  4.22 - 5.81 (MIL/uL)    Hemoglobin 12.6 (*) 13.0 - 17.0 (g/dL)    HCT 32.4 (*)  39.0 - 52.0 (%)    MCV 86.8  78.0 - 100.0 (fL)    MCH 28.8  26.0 - 34.0 (pg)    MCHC 33.2  30.0 - 36.0 (g/dL)    RDW 16.1  09.6 - 04.5 (%)    Platelets 121 (*) 150 - 400 (K/uL)   PROTIME-INR     Status: Normal   Collection Time   11/24/11  5:15 AM      Component Value Range Comment   Prothrombin Time 14.6  11.6 - 15.2 (seconds)    INR 1.12  0.00 - 1.49    MAGNESIUM     Status: Normal   Collection Time   11/24/11  5:15 AM      Component Value Range Comment   Magnesium 1.6  1.5 - 2.5 (mg/dL)   HEMOGLOBIN W0J     Status: Normal   Collection Time   11/24/11  5:15 AM      Component Value Range Comment   Hemoglobin A1C 4.6  <5.7 (%)    Mean Plasma Glucose 85  <117 (mg/dL)   GLUCOSE, CAPILLARY     Status: Normal   Collection Time   11/24/11  6:08 AM      Component Value Range Comment   Glucose-Capillary 81  70 - 99 (mg/dL)   MRSA PCR SCREENING     Status: Normal   Collection Time   11/24/11  6:57 PM      Component Value Range Comment   MRSA by PCR NEGATIVE  NEGATIVE    GLUCOSE, CAPILLARY     Status: Abnormal   Collection Time   11/25/11 12:28 AM      Component Value Range Comment   Glucose-Capillary 63 (*) 70 - 99 (mg/dL)   COMPREHENSIVE METABOLIC PANEL     Status: Abnormal   Collection Time   11/25/11  1:30 AM      Component Value Range Comment   Sodium 145  135 - 145 (mEq/L)    Potassium 3.0 (*) 3.5 - 5.1 (mEq/L)    Chloride 114 (*)  96 - 112 (mEq/L)    CO2 21  19 - 32 (mEq/L)    Glucose, Bld 66 (*) 70 - 99 (mg/dL)    BUN 6  6 - 23 (mg/dL)    Creatinine, Ser 8.11  0.50 - 1.35 (mg/dL)    Calcium 8.8  8.4 - 10.5 (mg/dL)    Total Protein 6.1  6.0 - 8.3 (g/dL)    Albumin 3.0 (*) 3.5 - 5.2 (g/dL)    AST 17  0 - 37 (U/L)    ALT 11  0 - 53 (U/L)    Alkaline Phosphatase 63  39 - 117 (U/L)    Total Bilirubin 1.0  0.3 - 1.2 (mg/dL)    GFR calc non Af Amer >90  >90 (mL/min)    GFR calc Af Amer >90  >90 (mL/min)   GLUCOSE, CAPILLARY     Status: Abnormal   Collection Time   11/25/11  5:32 AM      Component Value Range Comment   Glucose-Capillary 64 (*) 70 - 99 (mg/dL)   GLUCOSE, CAPILLARY     Status: Abnormal   Collection Time   11/25/11  7:45 AM      Component Value Range Comment   Glucose-Capillary 65 (*) 70 - 99 (mg/dL)    Comment 1 Notify RN      Comment 2 Documented in Chart     GLUCOSE, CAPILLARY  Status: Normal   Collection Time   11/25/11  8:33 AM      Component Value Range Comment   Glucose-Capillary 78  70 - 99 (mg/dL)    Dg Abd Acute W/chest  11/23/2011  *RADIOLOGY REPORT*  Clinical Data: Abdominal pain, nausea, vomiting.  ACUTE ABDOMEN SERIES (ABDOMEN 2 VIEW & CHEST 1 VIEW)  Comparison: 10/12/2011  Findings: Low lung volumes.  Lungs are clear.  Heart is normal size.  Left PICC line is in place with the tip in the SVC.  IMPRESSION: Low lung volumes.  No active disease.  Original Report Authenticated By: Cyndie Chime, M.D.    ROS  Blood pressure 115/70, pulse 108, temperature 98.9 F (37.2 C), temperature source Oral, resp. rate 18, height 5\' 9"  (1.753 m), weight 230 lb 6.1 oz (104.5 kg), SpO2 92.00%. Physical Exam  Airway - 2  Assessment/Plan Gastrostomy tube placement requested from IR secondary to dysphagia and failure to thrive. Informed consent obtained from patient's mother by telephone. Orders written. Plan tomorrow in IR with Dr. Lowella Dandy,  Jakhari Space 11/25/2011, 11:22 AM

## 2011-11-25 NOTE — Progress Notes (Signed)
Dr. Orvan Falconer, MD on call notified of cbg of 70, no new orders noted. Pt remains alert. Will cont to monitor. G.Shamyia Grandpre,RN

## 2011-11-25 NOTE — Progress Notes (Signed)
Speech Language/Pathology SLP Cancellation Note [phone]  Treatment cancelled today due to patient receiving procedure or test:  Pt NPO and order for PEG tube placement via IR placed.  SLP spoke to pt who complained of "can't breathe" with increased work of breathing observed.  RN made aware.  SLP to follow up next date for evaluation as ordered.  Please see evaluation from previous date.  Thanks.  Dollene Cleveland, MS [facility] SLP  [phone]

## 2011-11-25 NOTE — Progress Notes (Signed)
Interval History: Mr. Turvey is a 45 year old male quadriplegic with a h/o anoxic brain injury who was brought to the ER for evaluation of a questionable seizure type event. He was mildly hypoglycemic on initial evaluation.  He has chronic feeding problems and issues with hypoglycemia, and we have been asked to place a PEG tube.  He was seen by Dr. Wille Celeste of neurology on 11/24/11 due to his ? Seizure event.  An EEG has been ordered.  ROS: The patient states he is "OK with PEG placement", but unsure he has capacity to sign his own consent.  He denies pain.  He continues to be somewhat restless.     Objective: Vital signs in last 24 hours: Temp:  [97.5 F (36.4 C)-98.9 F (37.2 C)] 98.9 F (37.2 C) (11/28 0543) Pulse Rate:  [56-108] 108  (11/28 0543) Resp:  [18] 18  (11/28 0543) BP: (110-117)/(66-77) 115/70 mmHg (11/28 0543) SpO2:  [92 %-100 %] 92 % (11/28 0543) Weight:  [104.5 kg (230 lb 6.1 oz)] 230 lb 6.1 oz (104.5 kg) (11/28 0830) Weight change:  Last BM Date: 11/18/11  Intake/Output from previous day: No intake or output data in the 24 hours ending 11/25/11 0841   Physical Exam:  Gen: Restless  Cardiovascular: RRR, No M/R/G  Respiratory: Lungs CTAB  Gastrointestinal: Abdomen soft. Mild suprapubic tenderness. BS present x 4.  Extremities: No C/E/C   Lab Results: Basic Metabolic Panel:  Lab 11/25/11 9562 11/24/11 0515 11/23/11 1244 11/20/11 0330 11/19/11 0845  NA 145 -- 138 138 139  K 3.0* -- 3.9 -- --  CL 114* -- 106 107 110  CO2 21 -- 23 23 19   GLUCOSE 66* -- 69* 68* 76  BUN 6 -- 9 5* 6  CREATININE 0.88 -- 0.79 0.68 0.61  CALCIUM 8.8 -- 9.2 8.5 8.7  MG -- 1.6 -- -- --  PHOS -- -- -- -- --   Liver Function Tests:  Lab 11/25/11 0130 11/23/11 1244  AST 17 19  ALT 11 13  ALKPHOS 63 73  BILITOT 1.0 0.5  PROT 6.1 7.0  ALBUMIN 3.0* 3.2*    Lab 11/23/11 1244  LIPASE 33  AMYLASE --   No results found for this basename: AMMONIA:5 in the last 168  hours CBC:  Lab 11/24/11 0515 11/23/11 1244 11/20/11 0330 11/19/11 0845  WBC 5.3 7.4 4.6 4.4  NEUTROABS -- 4.6 -- --  HGB 12.6* 13.8 12.0* 13.1  HCT 38.0* 40.8 35.6* 37.4*  MCV 86.8 86.3 85.4 84.2  PLT 121* 122* 69* 68*   Cardiac Enzymes:  Lab 11/23/11 1355  CKTOTAL --  CKMB --  CKMBINDEX --  TROPONINI <0.30   BNP: No results found for this basename: POCBNP:5 in the last 168 hours CBG:  Lab 11/25/11 0833 11/25/11 0745 11/25/11 0532 11/25/11 0028 11/24/11 0608  GLUCAP 78 65* 64* 63* 81   D-Dimer No results found for this basename: DDIMER:2 in the last 72 hours Hgb A1c  Basename 11/24/11 0515  HGBA1C 4.6     Recent Results (from the past 240 hour(s))  CULTURE, BLOOD (ROUTINE X 2)     Status: Normal (Preliminary result)   Collection Time   11/23/11 12:40 PM      Component Value Range Status Comment   Specimen Description BLOOD RIGHT ARM   Final    Special Requests BOTTLES DRAWN AEROBIC ONLY [facility]   Final    Setup Time [id number]   Final    Culture  Final    Value:        BLOOD CULTURE RECEIVED NO GROWTH TO DATE CULTURE WILL BE HELD FOR 5 DAYS BEFORE ISSUING A FINAL NEGATIVE REPORT   Report Status PENDING   Incomplete   CULTURE, BLOOD (ROUTINE X 2)     Status: Normal (Preliminary result)   Collection Time   11/23/11 12:44 PM      Component Value Range Status Comment   Specimen Description BLOOD LEFT PICC   Final    Special Requests BOTTLES DRAWN AEROBIC AND ANAEROBIC 6CC   Final    Setup Time [id number]   Final    Culture     Final    Value:        BLOOD CULTURE RECEIVED NO GROWTH TO DATE CULTURE WILL BE HELD FOR 5 DAYS BEFORE ISSUING A FINAL NEGATIVE REPORT   Report Status PENDING   Incomplete   URINE CULTURE     Status: Normal   Collection Time   11/23/11  1:09 PM      Component Value Range Status Comment   Specimen Description URINE, CATHETERIZED   Final    Special Requests Normal   Final    Setup Time [id number]   Final    Colony Count NO GROWTH    Final    Culture NO GROWTH   Final    Report Status 11/25/2011 FINAL   Final   MRSA PCR SCREENING     Status: Normal   Collection Time   11/24/11  6:57 PM      Component Value Range Status Comment   MRSA by PCR NEGATIVE  NEGATIVE  Final     Studies/Results: Dg Abd Acute W/chest  11/23/2011  *RADIOLOGY REPORT*  Clinical Data: Abdominal pain, nausea, vomiting.  ACUTE ABDOMEN SERIES (ABDOMEN 2 VIEW & CHEST 1 VIEW)  Comparison: 10/12/2011  Findings: Low lung volumes.  Lungs are clear.  Heart is normal size.  Left PICC line is in place with the tip in the SVC.  IMPRESSION: Low lung volumes.  No active disease.  Original Report Authenticated By: Cyndie Chime, M.D.    Medications: Scheduled Meds:   . citalopram  20 mg Oral Daily  . dextrose      . docusate sodium  100 mg Oral BID  . DULoxetine  60 mg Oral Daily  . feeding supplement  30 mL Oral BID  . ferrous sulfate  325 mg Oral Q breakfast  . methimazole  5 mg Oral Daily  . metoprolol tartrate  12.5 mg Oral BID  . multivitamins ther. w/minerals  1 tablet Oral Daily  . pantoprazole  40 mg Oral Daily  . pneumococcal 23 valent vaccine  0.5 mL Intramuscular Once  . polyethylene glycol  17 g Oral BID  . polyvinyl alcohol  1 drop Both Eyes QID   Continuous Infusions:   . dextrose 5 % and 0.9% NaCl 100 mL/hr at 11/24/11 2123   PRN Meds:.acetaminophen, acetaminophen, albuterol, ipratropium, mineral oil, ondansetron, zolpidem  Assessment/Plan:  Principal Problem:  *Altered mental status R/O Grand mal seizure The patient has been seen and evaluated by the neurologist.  An EEG has been ordered.  Differential is syncope vs. Seizure event induced by hypoglycemia. Active Problems:  Hypoglycemia Sugars consistently in the 60's despite dextrose in IVF.  Needs PEG for feeding purposes.  Have requested IR place PEG.  Chronic constipation On colace, miralax routinely.  Hypokalemia Replete.  Thrombocytopenia H/O same.  Platelet count up.   HIT  panel negative 11/19/11.  FTT PEG tube for feeding.   LOS: 2 days   Hillery Aldo, MD Pager [phone]  11/25/2011, 8:41 AM

## 2011-11-26 ENCOUNTER — Inpatient Hospital Stay (HOSPITAL_COMMUNITY): Payer: Medicare Other

## 2011-11-26 ENCOUNTER — Other Ambulatory Visit (HOSPITAL_COMMUNITY): Payer: Medicare Other

## 2011-11-26 LAB — CBC
HCT: 35.1 % — ABNORMAL LOW (ref 39.0–52.0)
RBC: 4.05 MIL/uL — ABNORMAL LOW (ref 4.22–5.81)
RDW: 14.3 % (ref 11.5–15.5)
WBC: 4.9 10*3/uL (ref 4.0–10.5)

## 2011-11-26 LAB — BASIC METABOLIC PANEL
Chloride: 118 mEq/L — ABNORMAL HIGH (ref 96–112)
Creatinine, Ser: 0.76 mg/dL (ref 0.50–1.35)
GFR calc Af Amer: >90 mL/min (ref >90)
Potassium: 3.3 mEq/L — ABNORMAL LOW (ref 3.5–5.1)
Sodium: 149 mEq/L — ABNORMAL HIGH (ref 135–145)

## 2011-11-26 LAB — GLUCOSE, CAPILLARY
Glucose-Capillary: 137 mg/dL — ABNORMAL HIGH (ref 70–99)
Glucose-Capillary: 68 mg/dL — ABNORMAL LOW (ref 70–99)

## 2011-11-26 MED ORDER — PNEUMOCOCCAL VAC POLYVALENT 25 MCG/0.5ML IJ INJ
0.5000 mL | INJECTION | Freq: Once | INTRAMUSCULAR | Status: AC
  Administered 2011-11-26: 0.5 mL via INTRAMUSCULAR
  Filled 2011-11-26: qty 0.5

## 2011-11-26 MED ORDER — MIDAZOLAM HCL 5 MG/5ML IJ SOLN
INTRAMUSCULAR | Status: AC | PRN
  Administered 2011-11-26: 2 mg via INTRAVENOUS

## 2011-11-26 MED ORDER — IOHEXOL 300 MG/ML  SOLN
50.0000 mL | Freq: Once | INTRAMUSCULAR | Status: AC | PRN
  Administered 2011-11-26: 15 mL

## 2011-11-26 MED ORDER — VANCOMYCIN HCL IN DEXTROSE 1-5 GM/200ML-% IV SOLN
1000.0000 mg | Freq: Once | INTRAVENOUS | Status: AC
  Administered 2011-11-26: 1000 mg via INTRAVENOUS

## 2011-11-26 MED ORDER — MIDAZOLAM HCL 2 MG/2ML IJ SOLN
INTRAMUSCULAR | Status: AC
  Filled 2011-11-26: qty 2

## 2011-11-26 MED ORDER — POTASSIUM CHLORIDE 10 MEQ/100ML IV SOLN
10.0000 meq | INTRAVENOUS | Status: AC
  Administered 2011-11-26 (×3): 10 meq via INTRAVENOUS
  Filled 2011-11-26 (×3): qty 100

## 2011-11-26 MED ORDER — FENTANYL CITRATE 0.05 MG/ML IJ SOLN
INTRAMUSCULAR | Status: AC
  Filled 2011-11-26: qty 2

## 2011-11-26 MED ORDER — FENTANYL CITRATE 0.05 MG/ML IJ SOLN
INTRAMUSCULAR | Status: AC | PRN
  Administered 2011-11-26: 100 ug via INTRAVENOUS

## 2011-11-26 MED ORDER — VANCOMYCIN HCL IN DEXTROSE 1-5 GM/200ML-% IV SOLN
1000.0000 mg | Freq: Once | INTRAVENOUS | Status: DC
  Filled 2011-11-26: qty 200

## 2011-11-26 MED ORDER — DEXTROSE 50 % IV SOLN
25.0000 mL | Freq: Once | INTRAVENOUS | Status: AC | PRN
  Administered 2011-11-26: 13:00:00 via INTRAVENOUS

## 2011-11-26 NOTE — Progress Notes (Signed)
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 He was mildly hypoglycemic on initial evaluation.  He has chronic feeding problems and issues with hypoglycemia, and we have been asked to place a PEG tube.  He was seen by Dr. Wille Celeste of neurology on 11/24/11 due to his ? Seizure event.  An EEG has been ordered.  ROS: Patient is without any complaints today. His blood sugar again in 68 per nurse's report.  Objective: Vital signs in last 24 hours: Temp:  [97.5 F (36.4 C)-99.6 F (37.6 C)] 97.5 F (36.4 C) (11/29 0659) Pulse Rate:  [55-78] 78  (11/29 0659) Resp:  [18-20] 18  (11/29 0659) BP: (100-132)/(71-80) 100/71 mmHg (11/29 0659) SpO2:  [96 %-99 %] 96 % (11/29 0659) FiO2 (%):  [2 %] 2 % (11/28 2125) Weight change:  Last BM Date: 11/26/11  Intake/Output from previous day:  Intake/Output Summary (Last 24 hours) at 11/26/11 1341 Last data filed at 11/26/11 0915  Gross per 24 hour  Intake   1950 ml  Output      0 ml  Net   1950 ml     Physical Exam:  Gen: Restless  Cardiovascular: RRR, No M/R/G  Respiratory: Lungs CTAB  Gastrointestinal: Abdomen soft. Mild suprapubic tenderness. BS present x 4.  Extremities: No C/E/C   Lab Results: Basic Metabolic Panel:  Lab 11/26/11 4098 11/25/11 0130 11/24/11 0515 11/23/11 1244 11/20/11 0330  NA 149* 145 -- 138 138  K 3.3* 3.0* -- -- --  CL 118* 114* -- 106 107  CO2 23 21 -- 23 23  GLUCOSE 83 66* -- 69* 68*  BUN 4* 6 -- 9 5*  CREATININE 0.76 0.88 -- 0.79 0.68  CALCIUM 8.1* 8.8 -- 9.2 8.5  MG -- -- 1.6 -- --  PHOS -- -- -- -- --   Liver Function Tests:  Lab 11/25/11 0130 11/23/11 1244  AST 17 19  ALT 11 13  ALKPHOS 63 73  BILITOT 1.0 0.5  PROT 6.1 7.0  ALBUMIN 3.0* 3.2*    Lab 11/23/11 1244  LIPASE 33  AMYLASE --   No results found for this basename: AMMONIA:5 in the last 168  hours CBC:  Lab 11/26/11 0550 11/24/11 0515 11/23/11 1244 11/20/11 0330  WBC 4.9 5.3 7.4 4.6  NEUTROABS -- -- 4.6 --  HGB 11.4* 12.6* 13.8 12.0*  HCT 35.1* 38.0* 40.8 35.6*  MCV 86.7 86.8 86.3 85.4  PLT 113* 121* 122* 69*   Cardiac Enzymes:  Lab 11/23/11 1355  CKTOTAL --  CKMB --  CKMBINDEX --  TROPONINI <0.30   BNP: No results found for this basename: POCBNP:5 in the last 168 hours CBG:  Lab 11/26/11 1254 11/26/11 1233 11/26/11 0808 11/26/11 0444 11/26/11 0025  GLUCAP 137* 68* 79 83 84   D-Dimer No results found for this basename: DDIMER:2 in the last 72 hours Hgb A1c  Basename 11/24/11 0515  HGBA1C 4.6     Recent Results (from the past 240 hour(s))  CULTURE, BLOOD (ROUTINE X 2)     Status: Normal (Preliminary result)   Collection Time   11/23/11 12:40 PM      Component Value Range Status Comment   Specimen Description BLOOD RIGHT ARM   Final    Special Requests BOTTLES DRAWN AEROBIC ONLY [facility]   Final    Setup Time [id number]   Final  Culture     Final    Value:        BLOOD CULTURE RECEIVED NO GROWTH TO DATE CULTURE WILL BE HELD FOR 5 DAYS BEFORE ISSUING A FINAL NEGATIVE REPORT   Report Status PENDING   Incomplete   CULTURE, BLOOD (ROUTINE X 2)     Status: Normal (Preliminary result)   Collection Time   11/23/11 12:44 PM      Component Value Range Status Comment   Specimen Description BLOOD LEFT PICC   Final    Special Requests BOTTLES DRAWN AEROBIC AND ANAEROBIC 6CC   Final    Setup Time [id number]   Final    Culture     Final    Value:        BLOOD CULTURE RECEIVED NO GROWTH TO DATE CULTURE WILL BE HELD FOR 5 DAYS BEFORE ISSUING A FINAL NEGATIVE REPORT   Report Status PENDING   Incomplete   URINE CULTURE     Status: Normal   Collection Time   11/23/11  1:09 PM      Component Value Range Status Comment   Specimen Description URINE, CATHETERIZED   Final    Special Requests Normal   Final    Setup Time [id number]   Final    Colony Count NO GROWTH    Final    Culture NO GROWTH   Final    Report Status 11/25/2011 FINAL   Final   MRSA PCR SCREENING     Status: Normal   Collection Time   11/24/11  6:57 PM      Component Value Range Status Comment   MRSA by PCR NEGATIVE  NEGATIVE  Final     Studies/Results: No results found.  Medications: Scheduled Meds:    . citalopram  20 mg Oral Daily  . dextrose      . docusate sodium  100 mg Oral BID  . DULoxetine  60 mg Oral Daily  . feeding supplement  30 mL Oral BID  . ferrous sulfate  325 mg Oral Q breakfast  . methimazole  5 mg Oral Daily  . metoprolol tartrate  12.5 mg Oral BID  . multivitamins ther. w/minerals  1 tablet Oral Daily  . pantoprazole  40 mg Oral Daily  . pneumococcal 23 valent vaccine  0.5 mL Intramuscular Once  . polyethylene glycol  17 g Oral BID  . polyvinyl alcohol  1 drop Both Eyes QID  . potassium chloride  10 mEq Intravenous Q1 Hr x 3  . vancomycin  1,500 mg Intravenous Once  . DISCONTD: pneumococcal 23 valent vaccine  0.5 mL Intramuscular Once  . DISCONTD: potassium chloride  20 mEq Oral BID  . DISCONTD: vancomycin  1,500 mg Intravenous Once   Continuous Infusions:    . dextrose 5 % and 0.9% NaCl 150 mL/hr at 11/26/11 0911   PRN Meds:.acetaminophen, acetaminophen, albuterol, dextrose, dextrose, dextrose, ipratropium, mineral oil, ondansetron, zolpidem  Assessment/Plan:  Principal Problem:  *Altered mental status R/O Grand mal seizure The patient has been seen and evaluated by the neurologist.  EEG is still pending.   Active Problems:  Hypoglycemia Sugars consistently in the 60's despite dextrose in IVF.  Patient to  have PEG tube placed today.   Chronic constipation On colace, miralax routinely.   Hypokalemia Replete.   Thrombocytopenia Platelet count stable.  HIT panel negative 11/19/11.   FTT PEG tube for feeding being placed today.   LOS: 3 days   Molli Posey, MD Pager [phone]  11/26/2011, 1:41 PM

## 2011-11-26 NOTE — Procedures (Signed)
Placement of 20 [place] gastrostomy with fluoro guidance.  No immediate complication.  See Radiology Note.

## 2011-11-26 NOTE — Progress Notes (Signed)
CSW spoke w/pts mother, Talbert Forest, pt will return to [facility]  Amylia Collazos C. Koltyn Kelsay MSW, LCSW [phone]

## 2011-11-26 NOTE — Progress Notes (Signed)
Patient has peg tube placed. Abdominal binder applied, peg tube in place and intact.kept clamped, patient remained npo.

## 2011-11-26 NOTE — Progress Notes (Signed)
SLP Cancellation Note Note from 1525 page  Treatment cancelled today due to patient receiving PEG tube in IR today.  SLP paged MD to ask if swallow evaluation still desired given pt now with PEG and has history of dysphagia. Did not receive return call.      Pt diet was ground[facility]nectar at SNF with RN feeding him with aspiration event during seizure prior to admit.  MD please advise if desire evaluation for possible comfort feeding.  Thanks.    Dollene Cleveland, MS [facility] SLP [phone] pager

## 2011-11-26 NOTE — Progress Notes (Signed)
NGT PLACED FOR BARIUM ADMINISTRATION PATIENT TOLERATED FINE.

## 2011-11-27 ENCOUNTER — Inpatient Hospital Stay (HOSPITAL_COMMUNITY)
Admit: 2011-11-27 | Discharge: 2011-11-27 | Disposition: A | Discharge status: Home / Self Care | Payer: Medicare Other | Attending: Neurology | Admitting: Neurology

## 2011-11-27 LAB — GLUCOSE, CAPILLARY
Glucose-Capillary: 69 mg/dL — ABNORMAL LOW (ref 70–99)
Glucose-Capillary: 63 mg/dL — ABNORMAL LOW (ref 70–99)
Glucose-Capillary: 87 mg/dL (ref 70–99)

## 2011-11-27 LAB — CBC
Hemoglobin: 11.6 g/dL — ABNORMAL LOW (ref 13.0–17.0)
MCH: 29.4 pg (ref 26.0–34.0)
MCHC: 34.3 g/dL (ref 30.0–36.0)
RDW: 14.1 % (ref 11.5–15.5)

## 2011-11-27 LAB — BASIC METABOLIC PANEL
BUN: 3 mg/dL — ABNORMAL LOW (ref 6–23)
Calcium: 7.8 mg/dL — ABNORMAL LOW (ref 8.4–10.5)
GFR calc Af Amer: >90 mL/min (ref >90)
GFR calc non Af Amer: >90 mL/min (ref >90)
Glucose, Bld: 81 mg/dL (ref 70–99)
Sodium: 144 mEq/L (ref 135–145)

## 2011-11-27 MED ORDER — VITAL 1.5 CAL PO LIQD
1000.0000 mL | ORAL | Status: DC
  Administered 2011-11-27: 1000 mL
  Administered 2011-11-28: 50 mL/h
  Filled 2011-11-27 (×4): qty 1000

## 2011-11-27 MED ORDER — POTASSIUM CHLORIDE CRYS ER 20 MEQ PO TBCR
40.0000 meq | EXTENDED_RELEASE_TABLET | Freq: Two times a day (BID) | ORAL | Status: DC
  Filled 2011-11-27 (×2): qty 2

## 2011-11-27 MED ORDER — PROMETHAZINE HCL 25 MG/ML IJ SOLN: 50.0000 mg | Freq: Once | INTRAMUSCULAR | Status: DC

## 2011-11-27 MED ORDER — BACITRACIN-NEOMYCIN-POLYMYXIN 400-5-5000 EX OINT
TOPICAL_OINTMENT | CUTANEOUS | Status: AC
  Administered 2011-11-27: 1
  Filled 2011-11-27: qty 1

## 2011-11-27 MED ORDER — DEXTROSE 50 % IV SOLN
INTRAVENOUS | Status: AC
  Administered 2011-11-27: 25 mL via INTRAVENOUS
  Filled 2011-11-27: qty 50

## 2011-11-27 MED ORDER — POTASSIUM CHLORIDE 2 MEQ/ML IV SOLN
INTRAVENOUS | Status: DC
  Administered 2011-11-27 – 2011-11-29 (×3): via INTRAVENOUS
  Filled 2011-11-27 (×4): qty 1000

## 2011-11-27 MED ORDER — DEXTROSE 50 % IV SOLN
25.0000 mL | Freq: Once | INTRAVENOUS | Status: AC | PRN
  Administered 2011-11-27: 25 mL via INTRAVENOUS

## 2011-11-27 MED ORDER — PRO-STAT SUGAR FREE PO LIQD
30.0000 mL | Freq: Every day | ORAL | Status: DC
  Administered 2011-11-28 – 2011-11-30 (×3): 30 mL
  Filled 2011-11-27 (×4): qty 30

## 2011-11-27 MED ORDER — POTASSIUM CHLORIDE CRYS ER 20 MEQ PO TBCR
40.0000 meq | EXTENDED_RELEASE_TABLET | Freq: Two times a day (BID) | ORAL | Status: AC
  Administered 2011-11-27 – 2011-11-28 (×2): 40 meq via ORAL
  Filled 2011-11-27 (×2): qty 2

## 2011-11-27 MED ORDER — METOCLOPRAMIDE HCL 5 MG/ML IJ SOLN
5.0000 mg | Freq: Four times a day (QID) | INTRAMUSCULAR | Status: DC | PRN
  Administered 2011-11-27 (×2): 5 mg via INTRAVENOUS
  Filled 2011-11-27 (×2): qty 2

## 2011-11-27 MED FILL — Fentanyl Citrate Inj 0.05 MG/ML: INTRAMUSCULAR | Qty: 2 | Status: AC

## 2011-11-27 MED FILL — Midazolam HCl Inj 5 MG/5ML (Base Equivalent): INTRAMUSCULAR | Qty: 5 | Status: AC

## 2011-11-27 MED FILL — Midazolam HCl Inj 2 MG/2ML (Base Equivalent): INTRAMUSCULAR | Qty: 2 | Status: AC

## 2011-11-27 NOTE — Progress Notes (Addendum)
Nutrition Follow Up and Consult  Diet: NPO  - Pt had PEG placed yesterday, ready for use at 5pm today. RN reports pt still with nausea, however no vomiting or diarrhea.   Scheduled Meds:   . citalopram  20 mg Oral Daily  . docusate sodium  100 mg Oral BID  . DULoxetine  60 mg Oral Daily  . feeding supplement  30 mL Oral BID  . ferrous sulfate  325 mg Oral Q breakfast  . methimazole  5 mg Oral Daily  . metoprolol tartrate  12.5 mg Oral BID  . midazolam      . multivitamins ther. w/minerals  1 tablet Oral Daily  . pantoprazole  40 mg Oral Daily  . polyethylene glycol  17 g Oral BID  . polyvinyl alcohol  1 drop Both Eyes QID  . potassium chloride  10 mEq Intravenous Q1 Hr x 3  . potassium chloride  40 mEq Oral BID  . vancomycin  1,000 mg Intravenous Once  . DISCONTD: potassium chloride  20 mEq Oral BID  . DISCONTD: potassium chloride  40 mEq Oral BID  . DISCONTD: promethazine  50 mg Intravenous Once  . DISCONTD: vancomycin  1,000 mg Intravenous Once   Continuous Infusions:   . D-10-0.45% Sodium Chloride with KCL 40 meq/L 1000 ml    . DISCONTD: dextrose 5 % and 0.9% NaCl 150 mL/hr at 11/27/11 0658   PRN Meds:.acetaminophen, acetaminophen, albuterol, dextrose, dextrose, iohexol, ipratropium, metoCLOPramide (REGLAN) injection, mineral oil, ondansetron, zolpidem  CMP     Component Value Date/Time   NA 144 11/27/2011 0609   K 2.8* 11/27/2011 0609   CL 116* 11/27/2011 0609   CO2 22 11/27/2011 0609   GLUCOSE 81 11/27/2011 0609   BUN 3* 11/27/2011 0609   CREATININE 0.81 11/27/2011 0609   CALCIUM 7.8* 11/27/2011 0609   CALCIUM 8.7 02/19/2011 1515   PROT 6.1 11/25/2011 0130   ALBUMIN 3.0* 11/25/2011 0130   AST 17 11/25/2011 0130   ALT 11 11/25/2011 0130   ALKPHOS 63 11/25/2011 0130   BILITOT 1.0 11/25/2011 0130   GFRNONAA >90 11/27/2011 0609   GFRAA >90 11/27/2011 0609   CBG (last 3)   Basename 11/27/11 0835 11/26/11 2219 11/26/11 1720  GLUCAP 69* 74 98     Intake/Output Summary (Last 24 hours) at 11/27/11 1130 Last data filed at 11/27/11 0500  Gross per 24 hour  Intake   1050 ml  Output    202 ml  Net    848 ml   Last BM - 11/29  Nutrition Diagnosis - Inadequate oral intake - ongoing  Goal - Once PEG placed, for pt to tolerate TF and meet >90% of estimated nutritional needs - not met, TF not yet started.   Plan - Vital 1.5 start at 8ml/hr increase by 10ml q4hr to goal of 50ml/hr with Prosource 30ml via PEG once daily, which will provide 1872 calories, 96g protein, and free water, which will meet 100% of estimated nutritional needs. If IVF d/c recommend water flushes q6hr to meet fluid needs. RD to monitor for TF tolerance. Education not appropriate.   Dietitian # [phone]

## 2011-11-27 NOTE — Progress Notes (Signed)
CBG: 69   Treatment: D50 IV 25 mL  Symptoms: None  Follow-up CBG: [id number] CBG Result:97  Possible Reasons for Event: Other: NPO  Comments/MD notified:    Sharol Roussel [facility]

## 2011-11-27 NOTE — Progress Notes (Signed)
CBG: 63  Treatment: D50 IV 25 mL  Symptoms: None   Follow-up CBG: Time:1242 CBG Result:82  Possible Reasons for Event: Other: npo  Comments/MD notified:    Sharol Roussel [facility]

## 2011-11-27 NOTE — Progress Notes (Signed)
UR CHART REVIEWED 

## 2011-11-27 NOTE — Plan of Care (Signed)
Problem: Inadequate Intake (NI-2.1) Goal: Food and/or nutrient delivery Individualized approach for food/nutrient provision.  Outcome: Not Progressing Pt NPO, requiring PEG and TF to meet nutritional needs r/t dysphagia preventing him from meeting needs p.o.

## 2011-11-27 NOTE — Progress Notes (Signed)
Speech Language/Pathology MD, please advise if further swallow evaluation is desired for p.o's in addition to PEG tube feedings for increased QOL.  Thanks, Maryjo Rochester T

## 2011-11-27 NOTE — Progress Notes (Signed)
Subjective: No meaningful conversation.  Objective: Vital signs in last 24 hours: Temp:  [96.4 F (35.8 C)-98.8 F (37.1 C)] 98.8 F (37.1 C) (11/30 1302) Pulse Rate:  [52-83] 80  (11/30 1302) Resp:  [6-24] 20  (11/30 1302) BP: (97-127)/(62-89) 121/78 mmHg (11/30 1302) SpO2:  [95 %-100 %] 95 % (11/30 1302) Last BM Date: 11/26/11  Intake/Output from previous day: 11/29 0701 - 11/30 0700 In: 1050 [I.V.:1050] Out: 202 [Urine:201; Stool:1] Intake/Output this shift: Total I/O In: 0  Out: 100 [Urine:100] Physical exam: G tube site with clean dressing and abdominal binder. Site looks good. No drainage or significant erythema.  Lab Results:   [facility] 11/27/11 0609 11/26/11 0550  WBC 8.3 4.9  HGB 11.6* 11.4*  HCT 33.8* 35.1*  PLT 110* 113*   BMET  Basename 11/27/11 0609 11/26/11 0550  NA 144 149*  K 2.8* 3.3*  CL 116* 118*  CO2 22 23  GLUCOSE 81 83  BUN 3* 4*  CREATININE 0.81 0.76  CALCIUM 7.8* 8.1*   PT/INR No results found for this basename: LABPROT:2,INR:2 in the last 72 hours ABG No results found for this basename: PHART:2,PCO2:2,PO2:2,HCO3:2 in the last 72 hours  Studies/Results: Ir Gastrostomy Tube  11/26/2011  *RADIOLOGY REPORT*  Clinical history:History of anoxic brain injury with recurrent feeding problems.  PROCEDURE(S): PERCUTANEOUS GASTROSTOMY TUBE WITH FLUOROSCOPIC GUIDANCE  Physician: Rachelle Hora. Lowella Dandy, MD  Medications:Versed 2.00mg , Fentanyl 100 mcg, vancomycin 1 gram. Vancomycin was given within two hours of incision.  Vancomycin was given due to an antibiotic allergy.  Moderate sedation time:60 minutes  Fluoroscopy time: 23.7 minutes  Contrast:  15 ml Omnipaque-300  Procedure:Informed consent was obtained for a percutaneous gastrostomy tube.  The patient was placed on the interventional table.  Fluoroscopy demonstrated oral contrast in the transverse colon.  An orogastric tube was placed with fluoroscopic guidance. The anterior abdomen was prepped and  draped in sterile fashion. Maximal barrier sterile technique was utilized including caps, mask, sterile gowns, sterile gloves, sterile drape, hand hygiene and skin antiseptic.  Stomach was inflated with air through the orogastric tube.  The skin and subcutaneous tissues were anesthetized with 1% lidocaine.  A 17 gauge needle was directed into the distended stomach with fluoroscopic guidance.  A wire was advanced into the stomach and a T-tact was deployed.  A 9-French vascular sheath was placed and attempted to snare the orogastric tube.  These attempts were unsuccessful.  Eventually, the 9-French sheath and snare were removed and exchanged for a longer 9-French sheath and a new snare device.  A new orogastric tube was placed because the patient was repeatedly biting on the tube.  The orogastric tube was snared using a [employer] snare device.  The orogastric tube and snare were pulled out of the patient's mouth. The snare device was connected to a 20-French gastrostomy tube. The snare device and gastrostomy tube were pulled through the patient's mouth and out the anterior abdominal wall.  The gastrostomy tube was cut to an appropriate length.  Contrast injection through gastrostomy tube confirmed placement within the stomach.  Fluoroscopic images were obtained for documentation.  The gastrostomy tube was flushed with normal saline.  Findings: The stomach is horizontally oriented and located high within the abdomen.  Contrast was present in the transverse colon. Gastrostomy tube position was confirmed within the stomach.  Complications: None  Impression:Successful fluoroscopic guided percutaneous gastrostomy tube placement.  Original Report Authenticated By: Richarda Overlie, M.D.    Anti-infectives: Anti-infectives     Start  Dose/Rate Route Frequency Ordered Stop   11/26/11 1545   vancomycin (VANCOCIN) IVPB 1000 mg/200 mL premix        1,000 mg 200 mL/hr over 60 Minutes Intravenous  Once 11/26/11 1545 11/26/11  1625   11/26/11 1400   vancomycin (VANCOCIN) IVPB 1000 mg/200 mL premix  Status:  Discontinued        1,000 mg 200 mL/hr over 60 Minutes Intravenous  Once 11/26/11 1347 11/26/11 1544   11/26/11 0000   vancomycin (VANCOCIN) 1,500 mg in sodium chloride 0.9 % 500 mL IVPB        1,500 mg 250 mL/hr over 120 Minutes Intravenous  Once 11/25/11 1612 11/26/11 0147   11/25/11 1400   vancomycin (VANCOCIN) 1,500 mg in sodium chloride 0.9 % 500 mL IVPB  Status:  Discontinued        1,500 mg 250 mL/hr over 120 Minutes Intravenous  Once 11/25/11 1314 11/25/11 1446          Assessment/Plan: s/p gastrostomy tube placement 11/26/11. OK to use.   LOS: 4 days    [name] 11/27/2011

## 2011-11-27 NOTE — Progress Notes (Signed)
Subjective: Nonverbal afebrile overnight,  Objective: Vital signs in last 24 hours: Filed Vitals:   11/26/11 1645 11/26/11 1726 11/26/11 2221 11/27/11 0636  BP: 118/89 127/86 102/75 127/79  Pulse: 53 53 75 83  Temp: 96.4 F (35.8 C) 97.4 F (36.3 C) 98.3 F (36.8 C) 98.7 F (37.1 C)  TempSrc:  Oral Oral Oral  Resp: 14 16 20 20   Height:      Weight:      SpO2: 100% 100% 100% 100%    Intake/Output Summary (Last 24 hours) at 11/27/11 1030 Last data filed at 11/27/11 0500  Gross per 24 hour  Intake   1050 ml  Output    202 ml  Net    848 ml    Weight change:    Physical Exam:  Gen: Restless  Cardiovascular: RRR, No M/R/G  Respiratory: Lungs CTAB  Gastrointestinal: Abdomen soft. Mild suprapubic tenderness. BS present x 4.  Extremities: No C/E/C   Lab Results: Results for orders placed during the hospital encounter of 11/23/11 (from the past 24 hour(s))  GLUCOSE, CAPILLARY     Status: Abnormal   Collection Time   11/26/11 12:33 PM      Component Value Range   Glucose-Capillary 68 (*) 70 - 99 (mg/dL)   Comment 1 Documented in Chart     Comment 2 Notify RN    GLUCOSE, CAPILLARY     Status: Abnormal   Collection Time   11/26/11 12:54 PM      Component Value Range   Glucose-Capillary 137 (*) 70 - 99 (mg/dL)   Comment 1 Documented in Chart     Comment 2 Notify RN    GLUCOSE, CAPILLARY     Status: Normal   Collection Time   11/26/11  5:20 PM      Component Value Range   Glucose-Capillary 98  70 - 99 (mg/dL)   Comment 1 Notify RN     Comment 2 Documented in Chart    GLUCOSE, CAPILLARY     Status: Normal   Collection Time   11/26/11 10:19 PM      Component Value Range   Glucose-Capillary 74  70 - 99 (mg/dL)   Comment 1 Notify RN     Comment 2 Documented in Chart    CBC     Status: Abnormal   Collection Time   11/27/11  6:09 AM      Component Value Range   WBC 8.3  4.0 - 10.5 (K/uL)   RBC 3.95 (*) 4.22 - 5.81 (MIL/uL)   Hemoglobin 11.6 (*) 13.0 - 17.0  (g/dL)   HCT 30.8 (*) 65.7 - 52.0 (%)   MCV 85.6  78.0 - 100.0 (fL)   MCH 29.4  26.0 - 34.0 (pg)   MCHC 34.3  30.0 - 36.0 (g/dL)   RDW 84.6  96.2 - 95.2 (%)   Platelets 110 (*) 150 - 400 (K/uL)  BASIC METABOLIC PANEL     Status: Abnormal   Collection Time   11/27/11  6:09 AM      Component Value Range   Sodium 144  135 - 145 (mEq/L)   Potassium 2.8 (*) 3.5 - 5.1 (mEq/L)   Chloride 116 (*) 96 - 112 (mEq/L)   CO2 22  19 - 32 (mEq/L)   Glucose, Bld 81  70 - 99 (mg/dL)   BUN 3 (*) 6 - 23 (mg/dL)   Creatinine, Ser 8.41  0.50 - 1.35 (mg/dL)   Calcium 7.8 (*) 8.4 - 10.5 (  mg/dL)   GFR calc non Af Amer >90  >90 (mL/min)   GFR calc Af Amer >90  >90 (mL/min)  GLUCOSE, CAPILLARY     Status: Abnormal   Collection Time   11/27/11  8:35 AM      Component Value Range   Glucose-Capillary 69 (*) 70 - 99 (mg/dL)   Comment 1 Documented in Chart     Comment 2 Notify RN       Micro: Recent Results (from the past 240 hour(s))  CULTURE, BLOOD (ROUTINE X 2)     Status: Normal (Preliminary result)   Collection Time   11/23/11 12:40 PM      Component Value Range Status Comment   Specimen Description BLOOD RIGHT ARM   Final    Special Requests BOTTLES DRAWN AEROBIC ONLY 2CC   Final    Setup Time [id number]   Final    Culture     Final    Value:        BLOOD CULTURE RECEIVED NO GROWTH TO DATE CULTURE WILL BE HELD FOR 5 DAYS BEFORE ISSUING A FINAL NEGATIVE REPORT   Report Status PENDING   Incomplete   CULTURE, BLOOD (ROUTINE X 2)     Status: Normal (Preliminary result)   Collection Time   11/23/11 12:44 PM      Component Value Range Status Comment   Specimen Description BLOOD LEFT PICC   Final    Special Requests BOTTLES DRAWN AEROBIC AND ANAEROBIC 6CC   Final    Setup Time [id number]   Final    Culture     Final    Value:        BLOOD CULTURE RECEIVED NO GROWTH TO DATE CULTURE WILL BE HELD FOR 5 DAYS BEFORE ISSUING A FINAL NEGATIVE REPORT   Report Status PENDING   Incomplete   URINE  CULTURE     Status: Normal   Collection Time   11/23/11  1:09 PM      Component Value Range Status Comment   Specimen Description URINE, CATHETERIZED   Final    Special Requests Normal   Final    Setup Time [id number]   Final    Colony Count NO GROWTH   Final    Culture NO GROWTH   Final    Report Status 11/25/2011 FINAL   Final   MRSA PCR SCREENING     Status: Normal   Collection Time   11/24/11  6:57 PM      Component Value Range Status Comment   MRSA by PCR NEGATIVE  NEGATIVE  Final     Studies/Results: Ir Gastrostomy Tube  11/26/2011  *  Impression:Successful fluoroscopic guided percutaneous gastrostomy tube placement.  Original Report Authenticated By: Richarda Overlie, M.D.    Medications:     . citalopram  20 mg Oral Daily  . docusate sodium  100 mg Oral BID  . DULoxetine  60 mg Oral Daily  . feeding supplement  30 mL Oral BID  . ferrous sulfate  325 mg Oral Q breakfast  . methimazole  5 mg Oral Daily  . metoprolol tartrate  12.5 mg Oral BID  . midazolam      . multivitamins ther. w/minerals  1 tablet Oral Daily  . pantoprazole  40 mg Oral Daily  . polyethylene glycol  17 g Oral BID  . polyvinyl alcohol  1 drop Both Eyes QID  . potassium chloride  10 mEq Intravenous Q1 Hr x 3  . potassium chloride  40 mEq Oral BID  . vancomycin  1,000 mg Intravenous Once  . DISCONTD: potassium chloride  20 mEq Oral BID  . DISCONTD: promethazine  50 mg Intravenous Once  . DISCONTD: vancomycin  1,000 mg Intravenous Once    Assessment:  Principal Problem:  *Altered mental status R/O Grand mal seizure  The patient has been seen and evaluated by the neurologist. EEG is still pending.  Active Problems:  Hypoglycemia  Sugars consistently in the 70's despite dextrose in IVF. Changed to D 10 . Patient had PEG tube placed yesterday, nutrition consult ordered, to initiate tube feedings.  Chronic constipation  On colace, miralax routinely.  Hypokalemia  Replete. Start repletion through  PEG this evening. Thrombocytopenia  Platelet count stable. HIT panel negative 11/19/11.  FTT  PEG tube for feeding placed yesterday, cannot be used for 24 hours for IR     LOS: 4 days   [name] 11/27/2011, 10:30 AM

## 2011-11-27 NOTE — Progress Notes (Signed)
Patient for probable d/c tomorrow. Spoke w/[facility], CSW faxed information in. If patient is cleared to go, They can be reached at the main number of the facility, [phone]. Johana Hopkinson C. Kamrin Sibley MSW, LCSW [phone]

## 2011-11-27 NOTE — Progress Notes (Signed)
CBG: 64  Treatment: D50 IV 25 mL  Symptoms: None  Follow-up CBG: Time:1759 CBG Result:74  Possible Reasons for Event: Other: npo  Comments/MD notified:    Sharol Roussel [facility]

## 2011-11-28 LAB — BASIC METABOLIC PANEL
BUN: 4 mg/dL — ABNORMAL LOW (ref 6–23)
CO2: 24 mEq/L (ref 19–32)
Calcium: 7.9 mg/dL — ABNORMAL LOW (ref 8.4–10.5)
GFR calc non Af Amer: >90 mL/min (ref >90)
Glucose, Bld: 91 mg/dL (ref 70–99)
Potassium: 3.3 mEq/L — ABNORMAL LOW (ref 3.5–5.1)

## 2011-11-28 LAB — GLUCOSE, CAPILLARY
Glucose-Capillary: 74 mg/dL (ref 70–99)
Glucose-Capillary: 74 mg/dL (ref 70–99)
Glucose-Capillary: 88 mg/dL (ref 70–99)
Glucose-Capillary: 104 mg/dL — ABNORMAL HIGH (ref 70–99)
Glucose-Capillary: 96 mg/dL (ref 70–99)
Glucose-Capillary: 87 mg/dL (ref 70–99)

## 2011-11-28 MED ORDER — FERROUS SULFATE 300 (60 FE) MG/5ML PO SYRP
300.0000 mg | ORAL_SOLUTION | Freq: Every day | ORAL | Status: DC
  Administered 2011-11-28 – 2011-11-30 (×3): 300 mg
  Filled 2011-11-28 (×3): qty 5

## 2011-11-28 MED ORDER — POTASSIUM CHLORIDE 20 MEQ/15ML (10%) PO LIQD
40.0000 meq | Freq: Two times a day (BID) | ORAL | Status: AC
  Administered 2011-11-28 (×2): 40 meq via ORAL
  Filled 2011-11-28 (×2): qty 30

## 2011-11-28 MED ORDER — DOCUSATE SODIUM 50 MG/5ML PO LIQD
100.0000 mg | Freq: Two times a day (BID) | ORAL | Status: DC
  Administered 2011-11-28 – 2011-11-30 (×5): 100 mg
  Filled 2011-11-28 (×6): qty 10

## 2011-11-28 MED ORDER — POTASSIUM CHLORIDE CRYS ER 20 MEQ PO TBCR
40.0000 meq | EXTENDED_RELEASE_TABLET | Freq: Two times a day (BID) | ORAL | Status: DC
  Filled 2011-11-28 (×2): qty 2

## 2011-11-28 MED ORDER — PANTOPRAZOLE SODIUM 40 MG PO PACK
40.0000 mg | PACK | Freq: Every day | ORAL | Status: DC
  Administered 2011-11-28 – 2011-11-30 (×3): 40 mg
  Filled 2011-11-28 (×5): qty 20

## 2011-11-28 MED ORDER — MULTI-DELYN PO LIQD
5.0000 mL | Freq: Every day | ORAL | Status: DC
  Administered 2011-11-28 – 2011-11-30 (×3): 5 mL
  Filled 2011-11-28 (×3): qty 5

## 2011-11-28 MED ORDER — METOPROLOL TARTRATE 25 MG/10 ML ORAL SUSPENSION
12.5000 mg | Freq: Two times a day (BID) | ORAL | Status: DC
  Administered 2011-11-28 – 2011-11-30 (×4): 12.5 mg via ORAL
  Filled 2011-11-28 (×6): qty 5

## 2011-11-28 MED ORDER — PROMETHAZINE HCL 25 MG/ML IJ SOLN: 25.0000 mg | Freq: Four times a day (QID) | INTRAMUSCULAR | Status: DC | PRN

## 2011-11-28 NOTE — Progress Notes (Signed)
Subjective: Interval History: none.  No further seizure activity.  EEG showed moderate generalized slowing without epileptiforms or seizures.  Objective: Vital signs in last 24 hours: Temp:  [97.4 F (36.3 C)-99.4 F (37.4 C)] 97.7 F (36.5 C) (12/01 2015) Pulse Rate:  [74-94] 74  (12/01 2015) Resp:  [18-20] 19  (12/01 2015) BP: (94-126)/(67-75) 107/73 mmHg (12/01 2015) SpO2:  [95 %-96 %] 96 % (12/01 2015)  Intake/Output from previous day: 11/30 0701 - 12/01 0700 In: 8873 [I.V.:8813] Out: 900 [Urine:900] Intake/Output this shift: Total I/O In: 0  Out: 175 [Urine:175] Nutritional status: NPO  Neurologic Exam:  Opens eyes to stimulation.  Some eye contact.  Non-verbal.  Follows some basic commands.  Will not follow with eyes or smile.  Will not follow strength testing but does bend both knees and wiggle toes bilaterally.  Sensory, coordination, and gait could not be assessed.  Lab Results:  Basename 11/28/11 0620 11/27/11 0609 11/26/11 0550  WBC -- 8.3 4.9  HGB -- 11.6* 11.4*  HCT -- 33.8* 35.1*  PLT -- 110* 113*  NA 142 144 --  K 3.3* 2.8* --  CL 114* 116* --  CO2 24 22 --  GLUCOSE 91 81 --  BUN 4* 3* --  CREATININE 0.82 0.81 --  CALCIUM 7.9* 7.8* --  LABA1C -- -- --   Lipid Panel No results found for this basename: CHOL,TRIG,HDL,CHOLHDL,VLDL,LDLCALC in the last 72 hours  Studies/Results: EEG showed moderate generalized slowing without epileptiforms or seizures.    Medications: I have reviewed the patient's current medications. celexa, methimazole, metoprolol, protonix  Assessment/Plan:  History of anoxic brain injury with EEG that supports that history.  Neurologically stable.  Weston Settle, MD 11/28/2011  10:12 PM

## 2011-11-28 NOTE — Progress Notes (Signed)
Subjective: Nonverbal  Objective: Vital signs in last 24 hours: Filed Vitals:   11/27/11 0636 11/27/11 1302 11/27/11 2130 11/28/11 0500  BP: 127/79 121/78 125/65 126/75  Pulse: 83 80 76 94  Temp: 98.7 F (37.1 C) 98.8 F (37.1 C) 99.6 F (37.6 C) 99.4 F (37.4 C)  TempSrc: Oral Oral Oral Axillary  Resp: 20 20 16 18   Height:      Weight:      SpO2: 100% 95% 97% 96%    Intake/Output Summary (Last 24 hours) at 11/28/11 1215 Last data filed at 11/28/11 0900  Gross per 24 hour  Intake   8873 ml  Output   1000 ml  Net   7873 ml    Weight change:    Physical Exam:  Gen: Restless  Cardiovascular: RRR, No M/R/G  Respiratory: Lungs CTAB  Gastrointestinal: Abdomen soft. Mild suprapubic tenderness. BS present x 4.  Extremities: No C/E/C   Lab Results: Results for orders placed during the hospital encounter of 11/23/11 (from the past 24 hour(s))  GLUCOSE, CAPILLARY     Status: Normal   Collection Time   11/27/11 12:42 PM      Component Value Range   Glucose-Capillary 82  70 - 99 (mg/dL)  GLUCOSE, CAPILLARY     Status: Abnormal   Collection Time   11/27/11  5:10 PM      Component Value Range   Glucose-Capillary 64 (*) 70 - 99 (mg/dL)   Comment 1 Notify RN    GLUCOSE, CAPILLARY     Status: Normal   Collection Time   11/27/11  5:59 PM      Component Value Range   Glucose-Capillary 74  70 - 99 (mg/dL)  GLUCOSE, CAPILLARY     Status: Normal   Collection Time   11/27/11 10:05 PM      Component Value Range   Glucose-Capillary 87  70 - 99 (mg/dL)   Comment 1 Notify RN    GLUCOSE, CAPILLARY     Status: Normal   Collection Time   11/28/11  2:07 AM      Component Value Range   Glucose-Capillary 74  70 - 99 (mg/dL)  GLUCOSE, CAPILLARY     Status: Normal   Collection Time   11/28/11  4:09 AM      Component Value Range   Glucose-Capillary 88  70 - 99 (mg/dL)  BASIC METABOLIC PANEL     Status: Abnormal   Collection Time   11/28/11  6:20 AM      Component Value Range   Sodium 142  135 - 145 (mEq/L)   Potassium 3.3 (*) 3.5 - 5.1 (mEq/L)   Chloride 114 (*) 96 - 112 (mEq/L)   CO2 24  19 - 32 (mEq/L)   Glucose, Bld 91  70 - 99 (mg/dL)   BUN 4 (*) 6 - 23 (mg/dL)   Creatinine, Ser 1.61  0.50 - 1.35 (mg/dL)   Calcium 7.9 (*) 8.4 - 10.5 (mg/dL)   GFR calc non Af Amer >90  >90 (mL/min)   GFR calc Af Amer >90  >90 (mL/min)  GLUCOSE, CAPILLARY     Status: Normal   Collection Time   11/28/11  7:45 AM      Component Value Range   Glucose-Capillary 79  70 - 99 (mg/dL)   Comment 1 Notify RN    GLUCOSE, CAPILLARY     Status: Abnormal   Collection Time   11/28/11 11:52 AM      Component Value  Range   Glucose-Capillary 104 (*) 70 - 99 (mg/dL)   Comment 1 Notify RN       Micro: Recent Results (from the past 240 hour(s))  CULTURE, BLOOD (ROUTINE X 2)     Status: Normal (Preliminary result)   Collection Time   11/23/11 12:40 PM      Component Value Range Status Comment   Specimen Description BLOOD RIGHT ARM   Final    Special Requests BOTTLES DRAWN AEROBIC ONLY 2CC   Final    Setup Time [id number]   Final    Culture     Final    Value:        BLOOD CULTURE RECEIVED NO GROWTH TO DATE CULTURE WILL BE HELD FOR 5 DAYS BEFORE ISSUING A FINAL NEGATIVE REPORT   Report Status PENDING   Incomplete   CULTURE, BLOOD (ROUTINE X 2)     Status: Normal (Preliminary result)   Collection Time   11/23/11 12:44 PM      Component Value Range Status Comment   Specimen Description BLOOD LEFT PICC   Final    Special Requests BOTTLES DRAWN AEROBIC AND ANAEROBIC 6CC   Final    Setup Time [id number]   Final    Culture     Final    Value:        BLOOD CULTURE RECEIVED NO GROWTH TO DATE CULTURE WILL BE HELD FOR 5 DAYS BEFORE ISSUING A FINAL NEGATIVE REPORT   Report Status PENDING   Incomplete   URINE CULTURE     Status: Normal   Collection Time   11/23/11  1:09 PM      Component Value Range Status Comment   Specimen Description URINE, CATHETERIZED   Final    Special Requests  Normal   Final    Setup Time [id number]   Final    Colony Count NO GROWTH   Final    Culture NO GROWTH   Final    Report Status 11/25/2011 FINAL   Final   MRSA PCR SCREENING     Status: Normal   Collection Time   11/24/11  6:57 PM      Component Value Range Status Comment   MRSA by PCR NEGATIVE  NEGATIVE  Final     Studies/Results: Ir Gastrostomy Tube  11/26/2011  *  Impression:Successful fluoroscopic guided percutaneous gastrostomy tube placement.  Original Report Authenticated By: Richarda Overlie, M.D.    Medications:     . citalopram  20 mg Oral Daily  . docusate  100 mg Per Tube BID  . feeding supplement  30 mL Per Tube Q1200  . ferrous sulfate  300 mg Per Tube Daily  . methimazole  5 mg Oral Daily  . metoprolol tartrate  12.5 mg Oral BID  . multivitamin  5 mL Per Tube Daily  . neomycin-bacitracin-polymyxin      . pantoprazole sodium  40 mg Per Tube Q1200  . polyethylene glycol  17 g Oral BID  . polyvinyl alcohol  1 drop Both Eyes QID  . potassium chloride  40 mEq Oral BID  . potassium chloride  40 mEq Oral BID  . DISCONTD: docusate sodium  100 mg Oral BID  . DISCONTD: DULoxetine  60 mg Oral Daily  . DISCONTD: ferrous sulfate  325 mg Oral Q breakfast  . DISCONTD: metoprolol tartrate  12.5 mg Oral BID  . DISCONTD: multivitamins ther. w/minerals  1 tablet Oral Daily  . DISCONTD: pantoprazole  40 mg Oral Daily  Assessment:  Principal Problem:  *Altered mental status R/O Grand mal seizure  The patient has been seen and evaluated by the neurologist. EEGl pending.   Active Problems:  Hypoglycemia  Sugars consistently in the 70's despite dextrose in IVF. Changed to D 10 . Patient had PEG tube placed and tube feeding started.   Will d/c D10 once tube feeding at goal.  Chronic constipation  On colace, miralax routinely.   Hypokalemia  Repleted. Recheck in AM  Thrombocytopenia  Platelet count stable. HIT panel negative 11/19/11.   FTT  PEG tube for feeding placed  yesterday, cannot be used for 24 hours for IR  Disp: Placement.     LOS: 5 days   Earlene Plater MD, [facility] 11/28/2011, 12:15 PM

## 2011-11-28 NOTE — Progress Notes (Signed)
Ordered nausea medications not offering relief for the patient.  MD notified.  New orders received.  Will continue to monitor the pt.  Daphene Calamity [facility] 11/28/2011 1:50 AM

## 2011-11-28 NOTE — Procedures (Signed)
EEG NUMBER:  HISTORY:  A 45 year old male with possible seizure activity.  MEDICATIONS:  Lopressor, Protonix, Tylenol, Celexa, Cymbalta, Atrovent, Reglan, Versed, Zofran, Tapazole, and Sublimaze.  CONDITIONS OF RECORDING:  This is a 16-channel EEG carried out with the patient in the awake state.  DESCRIPTION:  Muscle and movement artifact were prominent throughout the tracing.  There were a mixture of frequencies noted throughout that were poorly sustained.  The maximum frequency seen in the posterior background rhythm was 8 Hz, but again, this was poorly sustained with the majority of the tracing having slower frequencies in the posterior background rhythm in the theta range.  Some delta was noted as well and again were more prominent than the maximum 8 Hz alpha that was seen. With further slowing during the tracing, there were noted some normal sleep transients that consisted of some symmetrical sleep spindles and vertex central sharp transients.  Hypoventilation was not performed. Intermittent photic stimulation failed to elicit any change in the tracing.  IMPRESSION:  This is an abnormal EEG secondary to general background slowing.  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  No epileptiform activity was noted.          ______________________________ Thana Farr, MD    [id number] D:  11/27/2011 18:19:04  T:  11/27/2011 23:56:55  Job #:  [id number]

## 2011-11-29 LAB — GLUCOSE, CAPILLARY
Glucose-Capillary: 80 mg/dL (ref 70–99)
Glucose-Capillary: 77 mg/dL (ref 70–99)
Glucose-Capillary: 99 mg/dL (ref 70–99)

## 2011-11-29 LAB — CULTURE, BLOOD (ROUTINE X 2)
Culture  Setup Time: 201211261838
Culture: NO GROWTH

## 2011-11-29 LAB — BASIC METABOLIC PANEL
CO2: 26 mEq/L (ref 19–32)
Calcium: 8.2 mg/dL — ABNORMAL LOW (ref 8.4–10.5)
Creatinine, Ser: 0.74 mg/dL (ref 0.50–1.35)
GFR calc Af Amer: >90 mL/min (ref >90)
GFR calc non Af Amer: >90 mL/min (ref >90)
Sodium: 139 mEq/L (ref 135–145)

## 2011-11-29 MED ORDER — VITAL 1.5 CAL PO LIQD
1000.0000 mL | ORAL | Status: DC
  Administered 2011-11-29: 1000 mL
  Filled 2011-11-29 (×2): qty 1000

## 2011-11-29 NOTE — Progress Notes (Signed)
Subjective: Patient is awake and alert this morning.  Objective: Vital signs in last 24 hours: Filed Vitals:   11/28/11 0500 11/28/11 1425 11/28/11 2015 11/29/11 0435  BP: 126/75 94/67 107/73 102/71  Pulse: 94 80 74 73  Temp: 99.4 F (37.4 C) 97.4 F (36.3 C) 97.7 F (36.5 C) 98.9 F (37.2 C)  TempSrc: Axillary Oral Axillary Axillary  Resp: 18 20 19 20   Height:      Weight:      SpO2: 96% 95% 96% 100%    Intake/Output Summary (Last 24 hours) at 11/29/11 0944 Last data filed at 11/29/11 0435  Gross per 24 hour  Intake    460 ml  Output    726 ml  Net   -266 ml    Weight change:    Physical Exam:  Gen: Restless  Cardiovascular: RRR, No M/R/G  Respiratory: Lungs CTAB  Gastrointestinal: Abdomen soft. Mild suprapubic tenderness. BS present x 4.  Extremities: No C/E/C   Lab Results: Results for orders placed during the hospital encounter of 11/23/11 (from the past 24 hour(s))  GLUCOSE, CAPILLARY     Status: Abnormal   Collection Time   11/28/11 11:52 AM      Component Value Range   Glucose-Capillary 104 (*) 70 - 99 (mg/dL)   Comment 1 Notify RN    GLUCOSE, CAPILLARY     Status: Normal   Collection Time   11/28/11  4:56 PM      Component Value Range   Glucose-Capillary 96  70 - 99 (mg/dL)   Comment 1 Notify RN    GLUCOSE, CAPILLARY     Status: Normal   Collection Time   11/28/11  8:15 PM      Component Value Range   Glucose-Capillary 87  70 - 99 (mg/dL)   Comment 1 Notify RN    GLUCOSE, CAPILLARY     Status: Normal   Collection Time   11/29/11  1:32 AM      Component Value Range   Glucose-Capillary 78  70 - 99 (mg/dL)   Comment 1 Notify RN    GLUCOSE, CAPILLARY     Status: Normal   Collection Time   11/29/11  4:36 AM      Component Value Range   Glucose-Capillary 80  70 - 99 (mg/dL)   Comment 1 Notify RN    BASIC METABOLIC PANEL     Status: Abnormal   Collection Time   11/29/11  4:45 AM      Component Value Range   Sodium 139  135 - 145 (mEq/L)   Potassium 4.1  3.5 - 5.1 (mEq/L)   Chloride 109  96 - 112 (mEq/L)   CO2 26  19 - 32 (mEq/L)   Glucose, Bld 82  70 - 99 (mg/dL)   BUN 7  6 - 23 (mg/dL)   Creatinine, Ser 4.09  0.50 - 1.35 (mg/dL)   Calcium 8.2 (*) 8.4 - 10.5 (mg/dL)   GFR calc non Af Amer >90  >90 (mL/min)   GFR calc Af Amer >90  >90 (mL/min)  GLUCOSE, CAPILLARY     Status: Normal   Collection Time   11/29/11  7:42 AM      Component Value Range   Glucose-Capillary 90  70 - 99 (mg/dL)     Micro: Recent Results (from the past 240 hour(s))  CULTURE, BLOOD (ROUTINE X 2)     Status: Normal   Collection Time   11/23/11 12:40 PM  Component Value Range Status Comment   Specimen Description BLOOD RIGHT ARM   Final    Special Requests BOTTLES DRAWN AEROBIC ONLY 2CC   Final    Setup Time [id number]   Final    Culture NO GROWTH 5 DAYS   Final    Report Status 11/29/2011 FINAL   Final   CULTURE, BLOOD (ROUTINE X 2)     Status: Normal   Collection Time   11/23/11 12:44 PM      Component Value Range Status Comment   Specimen Description BLOOD LEFT PICC   Final    Special Requests BOTTLES DRAWN AEROBIC AND ANAEROBIC [facility]   Final    Setup Time [id number]   Final    Culture NO GROWTH 5 DAYS   Final    Report Status 11/29/2011 FINAL   Final   URINE CULTURE     Status: Normal   Collection Time   11/23/11  1:09 PM      Component Value Range Status Comment   Specimen Description URINE, CATHETERIZED   Final    Special Requests Normal   Final    Setup Time [id number]   Final    Colony Count NO GROWTH   Final    Culture NO GROWTH   Final    Report Status 11/25/2011 FINAL   Final   MRSA PCR SCREENING     Status: Normal   Collection Time   11/24/11  6:57 PM      Component Value Range Status Comment   MRSA by PCR NEGATIVE  NEGATIVE  Final     Studies/Results: Ir Gastrostomy Tube  11/26/2011  *  Impression:Successful fluoroscopic guided percutaneous gastrostomy tube placement.  Original Report Authenticated By:  Richarda Overlie, M.D.    Medications:     . citalopram  20 mg Oral Daily  . docusate  100 mg Per Tube BID  . feeding supplement  30 mL Per Tube Q1200  . ferrous sulfate  300 mg Per Tube Daily  . methimazole  5 mg Oral Daily  . metoprolol tartrate  12.5 mg Oral BID  . multivitamin  5 mL Per Tube Daily  . pantoprazole sodium  40 mg Per Tube Q1200  . polyethylene glycol  17 g Oral BID  . polyvinyl alcohol  1 drop Both Eyes QID  . potassium chloride  40 mEq Oral BID  . DISCONTD: docusate sodium  100 mg Oral BID  . DISCONTD: DULoxetine  60 mg Oral Daily  . DISCONTD: ferrous sulfate  325 mg Oral Q breakfast  . DISCONTD: metoprolol tartrate  12.5 mg Oral BID  . DISCONTD: multivitamins ther. w/minerals  1 tablet Oral Daily  . DISCONTD: pantoprazole  40 mg Oral Daily  . DISCONTD: potassium chloride  40 mEq Oral BID    Assessment:  Principal Problem:  *Altered mental status R/O Grand mal seizure  The patient has been seen and evaluated by the neurologist. EEG pending. Stable Active Problems:  Hypoglycemia  d/c D10 and increase tube feeds monitor blood sugars.  Chronic constipation  On colace, miralax routinely.   Hypokalemia  repleted  Thrombocytopenia  Platelet count stable. HIT panel negative 11/19/11.   FTT  Increase tube feeds up to goal of 25mls/hr  Disp: Placement.     LOS: 6 days   Steven Plater MD, [facility] 11/29/2011, 9:44 AM

## 2011-11-30 LAB — GLUCOSE, CAPILLARY
Glucose-Capillary: 77 mg/dL (ref 70–99)
Glucose-Capillary: 88 mg/dL (ref 70–99)

## 2011-11-30 LAB — BASIC METABOLIC PANEL
CO2: 27 mEq/L (ref 19–32)
Calcium: 8.5 mg/dL (ref 8.4–10.5)
Potassium: 3.9 mEq/L (ref 3.5–5.1)
Sodium: 138 mEq/L (ref 135–145)

## 2011-11-30 MED ORDER — VITAL 1.5 CAL PO LIQD: 1000.0000 mL | ORAL | Status: DC

## 2011-11-30 MED ORDER — METOPROLOL TARTRATE 25 MG/10 ML ORAL SUSPENSION: 12.5000 mg | Freq: Two times a day (BID) | ORAL | Status: DC

## 2011-11-30 MED ORDER — PANTOPRAZOLE SODIUM 40 MG PO PACK: 40.0000 mg | PACK | Freq: Every day | ORAL | Status: DC

## 2011-11-30 MED ORDER — DOCUSATE SODIUM 50 MG/5ML PO LIQD: 100.0000 mg | Freq: Two times a day (BID) | ORAL | Status: AC

## 2011-11-30 MED ORDER — MULTI-DELYN PO LIQD: 5.0000 mL | Freq: Every day | ORAL | Status: DC

## 2011-11-30 MED ORDER — VITAL 1.5 CAL PO LIQD
1000.0000 mL | ORAL | Status: DC
  Administered 2011-11-30: 1000 mL

## 2011-11-30 NOTE — Plan of Care (Signed)
Problem: Inadequate Intake (NI-2.1) Goal: Food and/or nutrient delivery Individualized approach for food/nutrient provision.  Outcome: Not Progressing Pt NPO, relying on TF to meet nutritional needs.

## 2011-11-30 NOTE — Progress Notes (Signed)
Report called to Phoebe Sharps, Charity fundraiser at [facility].

## 2011-11-30 NOTE — Discharge Summary (Signed)
Physician Discharge Summary  Patient ID: Jmarion Christiano MRN: [id number] DOB/AGE: 44-Nov-1967 44 y.o.  Admit date: 11/23/2011 Discharge date: 11/30/2011  Discharge Diagnoses:  Altered mental status  Grand mal seizure  Hypoglycemia  Chronic constipation  Hypokalemia  Thrombocytopenia  FTT (failure to thrive) in adult   Current Discharge Medication List    START taking these medications   Details  docusate (COLACE) 50 MG/5ML liquid Place 10 mLs (100 mg total) into feeding tube 2 (two) times daily. Qty: 100 mL, Refills: 0    metoprolol tartrate (LOPRESSOR) 25 mg/10 mL SUSP Take 5 mLs (12.5 mg total) by mouth 2 (two) times daily. Qty: 300 mL, Refills: 0    Nutritional Supplements (FEEDING SUPPLEMENT, VITAL 1.5 CAL,) LIQD Place 1,000 mLs into feeding tube continuous. Qty: 30 Can, Refills: 0    pantoprazole sodium (PROTONIX) 40 mg/20 mL PACK Place 20 mLs (40 mg total) into feeding tube daily at 12 noon. Qty: 30 each, Refills: 0    Pediatric Multiple Vitamins (MULTIVITAMIN) LIQD Place 5 mLs into feeding tube daily. Qty: 1 Bottle, Refills: 0      CONTINUE these medications which have NOT CHANGED   Details  citalopram (CELEXA) 20 MG tablet Take 20 mg by mouth daily.      feeding supplement (PRO-STAT SUGAR FREE 64) LIQD Take 30 mLs by mouth 2 (two) times daily.      ferrous sulfate 325 (65 FE) MG tablet Take 325 mg by mouth 3 (three) times daily with meals.      fish oil-omega-3 fatty acids 1000 MG capsule Take 1 g by mouth 2 (two) times daily.      ipratropium-albuterol (DUONEB) 0.5-2.5 (3) MG/3ML SOLN Take 3 mLs by nebulization every 6 (six) hours as needed. SHORTNESS OF BREATH     methimazole (TAPAZOLE) 5 MG tablet Take 5 mg by mouth daily.      methylcellulose (ARTIFICIAL TEARS) 1 % ophthalmic solution Place 1 drop into both eyes 4 (four) times daily.      mineral oil enema Place 1 enema rectally as needed. FOR CONSTIPATION     polyethylene glycol (MIRALAX / GLYCOLAX)  packet Take 17 g by mouth 2 (two) times daily.        STOP taking these medications     docusate sodium (COLACE) 100 MG capsule      DULoxetine (CYMBALTA) 60 MG capsule      metoprolol tartrate (LOPRESSOR) 25 MG tablet      Multiple Vitamins-Minerals (MULTIVITAMINS THER. W/MINERALS) TABS      pantoprazole (PROTONIX) 40 MG tablet         Discharge Orders    Future Orders Please Complete By Expires   Increase activity slowly      Discharge diet:      Scheduling Instructions:   NPO, tube feeds at 50/ms per hour.   Comments:   Free water flushes 240 mils every 6 hours        Disposition: Skilled Nursing Facility  Discharged Condition: Stable  Full Code   Consults:  Neurology  HPI: Ibraheem Voris is an 45 y.o. male who is a resident of the [facility] skilled nursing facility. He was sent to the emergency department today because when the caretakers were trying to feed the patient today he apparently had some type of seizure-like activity where he began rolling his eyes, his arms and hands were shaking, his lips turned blue, and his face became contorted. This is according to the account of the caretaker at  the facility. They sent him to the emergency department because they've been having more difficulty feeding him and they've never witnessed him having this type of an episode. 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 When he got into the emergency department he was found to be hypoglycemic with a blood sugar of 61 at the time. He was given dextrose IV and it corrected. The patient had recently been discharged approximately one week ago from the hospital for severe dehydration and other medical issues. It was noted that if the patient continued to have difficulty with feeding and dysphasia that a PEG tube should be considered. The patient has had a PEG tube in the past. Now his family is requesting a PEG tube so that he can have nutrition  through the tube. At this time he is at high-risk for further hypoglycemia and dehydration and not able to take his medications without concern about aspiration. Hospital admission was requested for further evaluation for his failure to thrive, worsening physical debilitation and dysphasia and poor by mouth intake.  PMH:  As per H & P FH: As per  H & P SH: As per H & P Med: as per H & P Allergies and ROS: as per H&P   Discharge Exam: Blood pressure 94/67, pulse 74, temperature 97.9 F (36.6 C), temperature source Oral, resp. rate 18, height 5\' 9"  (1.753 m), weight 104.5 kg (230 lb 6.1 oz), SpO2 97.00%.  Physical Exam:  Gen: Restless  Cardiovascular: RRR, No M/R/G  Respiratory: Lungs CTAB  Gastrointestinal: Abdomen soft. Mild suprapubic tenderness. BS present x 4.  Extremities: No C/E/C     Hospital Course:  Altered mental status  Grand mal seizure  Hypoglycemia  Chronic constipation  Hypokalemia  Thrombocytopenia  FTT (failure to thrive) in adult Patient  was admitted to the hospital secondary to altered mental status grand mal seizure related to severe hypoglycemia from poor by mouth intake that's recurrent. Patient also has failure to thrive. He was evaluated by neurology and neurology did not think he needed antiseizure medications; this is his just one episode provoked by severe hypoglycemia. Patient had PEG tube placed this admission and now he is getting his feedings through the PEG. Patient will be discharged back to the nursing home. No further hypoglycemic events since he's been getting tube feeds. He had EEG done that showed generalized slowing that is consistent with his anoxic brain injury.  Patient will continue his medications. They were switched over to per tube. His Cymbalta could not be switched over so he will be continued on his other antidepressant medication.  Patient is stable at the time of discharge.  Labs:   Results for orders placed during the hospital  encounter of 11/23/11 (from the past 48 hour(s))  GLUCOSE, CAPILLARY     Status: Normal   Collection Time   11/28/11  4:56 PM      Component Value Range Comment   Glucose-Capillary 96  70 - 99 (mg/dL)    Comment 1 Notify RN     GLUCOSE, CAPILLARY     Status: Normal   Collection Time   11/28/11  8:15 PM      Component Value Range Comment   Glucose-Capillary 87  70 - 99 (mg/dL)    Comment 1 Notify RN     GLUCOSE, CAPILLARY     Status: Normal   Collection Time   11/29/11  1:32 AM      Component  Value Range Comment   Glucose-Capillary 78  70 - 99 (mg/dL)    Comment 1 Notify RN     GLUCOSE, CAPILLARY     Status: Normal   Collection Time   11/29/11  4:36 AM      Component Value Range Comment   Glucose-Capillary 80  70 - 99 (mg/dL)    Comment 1 Notify RN     BASIC METABOLIC PANEL     Status: Abnormal   Collection Time   11/29/11  4:45 AM      Component Value Range Comment   Sodium 139  135 - 145 (mEq/L)    Potassium 4.1  3.5 - 5.1 (mEq/L)    Chloride 109  96 - 112 (mEq/L)    CO2 26  19 - 32 (mEq/L)    Glucose, Bld 82  70 - 99 (mg/dL)    BUN 7  6 - 23 (mg/dL)    Creatinine, Ser 1.61  0.50 - 1.35 (mg/dL)    Calcium 8.2 (*) 8.4 - 10.5 (mg/dL)    GFR calc non Af Amer >90  >90 (mL/min)    GFR calc Af Amer >90  >90 (mL/min)   GLUCOSE, CAPILLARY     Status: Normal   Collection Time   11/29/11  7:42 AM      Component Value Range Comment   Glucose-Capillary 90  70 - 99 (mg/dL)   GLUCOSE, CAPILLARY     Status: Normal   Collection Time   11/29/11 11:45 AM      Component Value Range Comment   Glucose-Capillary 77  70 - 99 (mg/dL)   GLUCOSE, CAPILLARY     Status: Normal   Collection Time   11/29/11  4:28 PM      Component Value Range Comment   Glucose-Capillary 99  70 - 99 (mg/dL)   GLUCOSE, CAPILLARY     Status: Normal   Collection Time   11/30/11  2:45 AM      Component Value Range Comment   Glucose-Capillary 77  70 - 99 (mg/dL)    Comment 1 Notify RN     BASIC METABOLIC PANEL      Status: Normal   Collection Time   11/30/11  6:00 AM      Component Value Range Comment   Sodium 138  135 - 145 (mEq/L)    Potassium 3.9  3.5 - 5.1 (mEq/L)    Chloride 107  96 - 112 (mEq/L)    CO2 27  19 - 32 (mEq/L)    Glucose, Bld 89  70 - 99 (mg/dL)    BUN 7  6 - 23 (mg/dL)    Creatinine, Ser 0.96  0.50 - 1.35 (mg/dL)    Calcium 8.5  8.4 - 10.5 (mg/dL)    GFR calc non Af Amer >90  >90 (mL/min)    GFR calc Af Amer >90  >90 (mL/min)   GLUCOSE, CAPILLARY     Status: Normal   Collection Time   11/30/11  8:36 AM      Component Value Range Comment   Glucose-Capillary 96  70 - 99 (mg/dL)    Comment 1 Documented in Chart      Comment 2 Notify RN     GLUCOSE, CAPILLARY     Status: Abnormal   Collection Time   11/30/11 12:40 PM      Component Value Range Comment   Glucose-Capillary 100 (*) 70 - 99 (mg/dL)    Comment 1 Documented in Chart  Comment 2 Notify RN       Diagnostics:  Ct Head Wo Contrast  11/12/2011  *RADIOLOGY REPORT*  Clinical Data: Fatigue  CT HEAD WITHOUT CONTRAST  Technique:  Contiguous axial images were obtained from the base of the skull through the vertex without contrast.  Comparison: Head CT 10/04/2011  Findings: No acute intracranial hemorrhage.  No focal mass lesion. No CT evidence of acute infarction.   No midline shift or mass effect.  No hydrocephalus.  Basilar cisterns are patent. Age advanced atrophy is similar to prior. Paranasal sinuses and mastoid air cells are clear.  Orbits are normal.  IMPRESSION: No acute intracranial findings.  No change from prior.  Original Report Authenticated By: Genevive Bi, M.D.   Ir Gastrostomy Tube  11/26/2011  *RADIOLOGY REPORT*  Clinical history:History of anoxic brain injury with recurrent feeding problems.  PROCEDURE(S): PERCUTANEOUS GASTROSTOMY TUBE WITH FLUOROSCOPIC GUIDANCE  Physician: Rachelle Hora. Lowella Dandy, MD  Medications:Versed 2.00mg , Fentanyl 100 mcg, vancomycin 1 gram. Vancomycin was given within two hours of incision.   Vancomycin was given due to an antibiotic allergy.  Moderate sedation time:60 minutes  Fluoroscopy time: 23.7 minutes  Contrast:  15 ml Omnipaque-300  Procedure:Informed consent was obtained for a percutaneous gastrostomy tube.  The patient was placed on the interventional table.  Fluoroscopy demonstrated oral contrast in the transverse colon.  An orogastric tube was placed with fluoroscopic guidance. The anterior abdomen was prepped and draped in sterile fashion. Maximal barrier sterile technique was utilized including caps, mask, sterile gowns, sterile gloves, sterile drape, hand hygiene and skin antiseptic.  Stomach was inflated with air through the orogastric tube.  The skin and subcutaneous tissues were anesthetized with 1% lidocaine.  A 17 gauge needle was directed into the distended stomach with fluoroscopic guidance.  A wire was advanced into the stomach and a T-tact was deployed.  A 9-French vascular sheath was placed and attempted to snare the orogastric tube.  These attempts were unsuccessful.  Eventually, the 9-French sheath and snare were removed and exchanged for a longer 9-French sheath and a new snare device.  A new orogastric tube was placed because the patient was repeatedly biting on the tube.  The orogastric tube was snared using a [place] snare device.  The orogastric tube and snare were pulled out of the patient's mouth. The snare device was connected to a 20-French gastrostomy tube. The snare device and gastrostomy tube were pulled through the patient's mouth and out the anterior abdominal wall.  The gastrostomy tube was cut to an appropriate length.  Contrast injection through gastrostomy tube confirmed placement within the stomach.  Fluoroscopic images were obtained for documentation.  The gastrostomy tube was flushed with normal saline.  Findings: The stomach is horizontally oriented and located high within the abdomen.  Contrast was present in the transverse colon. Gastrostomy tube  position was confirmed within the stomach.  Complications: None  Impression:Successful fluoroscopic guided percutaneous gastrostomy tube placement.  Original Report Authenticated By: Richarda Overlie, M.D.   Ir US Guide Vasc Access Left  11/15/2011  * RADIOLOGY REPORT *  Clinical data: Renal failure, UTI, needs access for antibiotics  PICC PLACEMENT WITH ULTRASOUND AND FLUOROSCOPY  Technique: After written informed consent was obtained, patient was placed in the supine position on angiographic table. Patency of the left brachial vein was confirmed with ultrasound with image documentation. An appropriate skin site was determined. Skin site was marked. Region was prepped using maximum barrier technique including cap and mask, sterile gown, sterile gloves,  large sterile sheet, and Chlorhexidine   as cutaneous antisepsis.  The region was infiltrated locally with 1% lidocaine.   Under real-time ultrasound guidance, the left brachial vein was accessed with a 21 gauge micropuncture needle; the needle tip within the vein was confirmed with ultrasound image documentation.   Needle exchanged over a 018 guidewire for a peel-away sheath, through which a 5-French dual- lumen power injectable PICC trimmed to 43cm was advanced, positioned with its tip near the cavoatrial junction.  Spot chest radiograph confirms appropriate catheter position.  Catheter was flushed per protocol and secured externally with 0-Prolene sutures. The patient tolerated procedure well, with no immediate complication.  IMPRESSION: Technically successful five [place] dual  lumen power injectable PICC placement  Original Report Authenticated By: Osa Craver, M.D.   Dg Chest Port 1 View  11/18/2011  *RADIOLOGY REPORT*  Clinical Data: Cough  PORTABLE CHEST - 1 VIEW  Comparison: 11/12/2011  Findings: Heart size is within normal limits.  Negative for heart failure or pneumonia.  Lungs are clear.  Left arm PICC tip in the SVC  IMPRESSION: PICC tip in the  SVC.  No acute cardiopulmonary disease.  Original Report Authenticated By: Camelia Phenes, M.D.   Dg Chest Portable 1 View  11/12/2011  *RADIOLOGY REPORT*  Clinical Data: Addison's disease.  Tachycardia.  Weakness.  PORTABLE CHEST - 1 VIEW  Comparison: 10/07/2011  Findings: Low lung volumes as seen however both lungs are clear. Heart size is within normal limits.  No evidence of pleural effusion.  IMPRESSION: Low lung volumes.  No active disease.  Original Report Authenticated By: Danae Orleans, M.D.   Dg Abd Acute W/chest  11/23/2011  *RADIOLOGY REPORT*  Clinical Data: Abdominal pain, nausea, vomiting.  ACUTE ABDOMEN SERIES (ABDOMEN 2 VIEW & CHEST 1 VIEW)  Comparison: 10/12/2011  Findings: Low lung volumes.  Lungs are clear.  Heart is normal size.  Left PICC line is in place with the tip in the SVC.  IMPRESSION: Low lung volumes.  No active disease.  Original Report Authenticated By: Cyndie Chime, M.D.   Ir Fluoro Guide Cv Midline Picc Left  11/15/2011  * RADIOLOGY REPORT *  Clinical data: Renal failure, UTI, needs access for antibiotics  PICC PLACEMENT WITH ULTRASOUND AND FLUOROSCOPY  Technique: After written informed consent was obtained, patient was placed in the supine position on angiographic table. Patency of the left brachial vein was confirmed with ultrasound with image documentation. An appropriate skin site was determined. Skin site was marked. Region was prepped using maximum barrier technique including cap and mask, sterile gown, sterile gloves, large sterile sheet, and Chlorhexidine   as cutaneous antisepsis.  The region was infiltrated locally with 1% lidocaine.   Under real-time ultrasound guidance, the left brachial vein was accessed with a 21 gauge micropuncture needle; the needle tip within the vein was confirmed with ultrasound image documentation.   Needle exchanged over a 018 guidewire for a peel-away sheath, through which a 5-French dual- lumen power injectable PICC trimmed to  43cm was advanced, positioned with its tip near the cavoatrial junction.  Spot chest radiograph confirms appropriate catheter position.  Catheter was flushed per protocol and secured externally with 0-Prolene sutures. The patient tolerated procedure well, with no immediate complication.  IMPRESSION: Technically successful five [place] dual  lumen power injectable PICC placement  Original Report Authenticated By: Osa Craver, M.D.    SignedEarlene Plater MD, [facility] Pier 11/30/2011, 3:58 PM

## 2011-11-30 NOTE — Progress Notes (Signed)
Nutrition Follow Up  Diet: NPO  TF: Vital 1.5 via PEG at 37ml/hr with Prosource 30ml via PEG daily - provides 1872 calories, 96g protein, and free water, which meets 100% of estimated nutritional needs  - Noted pt with some nausea over the weekend, however now resolved per RN. RN reports pt tolerating TF well.   Scheduled Meds:   . citalopram  20 mg Oral Daily  . docusate  100 mg Per Tube BID  . feeding supplement  30 mL Per Tube Q1200  . ferrous sulfate  300 mg Per Tube Daily  . methimazole  5 mg Oral Daily  . metoprolol tartrate  12.5 mg Oral BID  . multivitamin  5 mL Per Tube Daily  . pantoprazole sodium  40 mg Per Tube Q1200  . polyethylene glycol  17 g Oral BID  . polyvinyl alcohol  1 drop Both Eyes QID   Continuous Infusions:   . feeding supplement (VITAL 1.5 CAL) 1,000 mL (11/29/11 1701)   PRN Meds:.acetaminophen, acetaminophen, albuterol, ipratropium, metoCLOPramide (REGLAN) injection, mineral oil, ondansetron, promethazine, zolpidem  CMP     Component Value Date/Time   NA 138 11/30/2011 0600   K 3.9 11/30/2011 0600   CL 107 11/30/2011 0600   CO2 27 11/30/2011 0600   GLUCOSE 89 11/30/2011 0600   BUN 7 11/30/2011 0600   CREATININE 0.81 11/30/2011 0600   CALCIUM 8.5 11/30/2011 0600   CALCIUM 8.7 02/19/2011 1515   PROT 6.1 11/25/2011 0130   ALBUMIN 3.0* 11/25/2011 0130   AST 17 11/25/2011 0130   ALT 11 11/25/2011 0130   ALKPHOS 63 11/25/2011 0130   BILITOT 1.0 11/25/2011 0130   GFRNONAA >90 11/30/2011 0600   GFRAA >90 11/30/2011 0600   CBG (last 3)   Basename 11/30/11 0836 11/30/11 0245 11/29/11 1628  GLUCAP 96 77 99    Intake/Output Summary (Last 24 hours) at 11/30/11 1027 Last data filed at 11/30/11 0600  Gross per 24 hour  Intake   1906 ml  Output    450 ml  Net   1456 ml   Last BM - today  Nutrition Diagnosis - Inadequate oral intake - ongoing   Goal - Pt to tolerate TF and meet >90% of estimated nutritional needs - met.  Plan -  Recommend  water flushes q6hr to meet fluid needs. Recommend continue current TF plan. No new goals. Education not appropriate, pt asleep. Diet per MD/SLP. Will monitor.   Dietitian # [phone]

## 2011-11-30 NOTE — Progress Notes (Signed)
Physical Therapy Treatment Patient Details Name: [name] MRN: [id number] DOB: 06/12/1966 Today's Date: 11/30/2011 1245-1310  2Ta  PT Assessment/Plan  PT - Assessment/Plan Comments on Treatment Session: Patient limited due to lack of participation. Tech reports not wanting to wake up due to not sleeping well at night.  This was our second attempt today due to not wanting to participate this morning with c/o wanting to sleep. PT Plan: Discharge plan remains appropriate PT Frequency: Min 3X/week Follow Up Recommendations: Skilled nursing facility Equipment Recommended: Defer to next venue PT Goals  Acute Rehab PT Goals PT Goal: Supine/Side to Sit - Progress: Other (comment) (not progressing due to decreased motivation) Pt will go Sit to Stand: with min assist PT Goal: Sit to Stand - Progress: Progressing toward goal Pt will go Stand to Sit: with min assist PT Goal: Stand to Sit - Progress: Progressing toward goal PT Goal: Ambulate - Progress: Other (comment) (not progressing due to refusing to walk today)  PT Treatment Precautions/Restrictions  Precautions Precautions: Fall Mobility (including Balance) Bed Mobility Bed Mobility: Yes Supine to Sit: 1: +2 Total assist;HOB elevated (Comment degrees) Supine to Sit Details (indicate cue type and reason): pt slightly resistive due to not wanting to get up, HOB 40 degrees Sit to Supine - Right: 5: Supervision Sit to Supine - Right Details (indicate cue type and reason): patient impulsive, resistive to OOB Transfers Sit to Stand: 1: +2 Total assist;From bed Sit to Stand Details (indicate cue type and reason): pt=60%, cues for safety, initiation, times 3 attempts Stand to Sit: 4: Min assist Stand to Sit Details: assist for safety due to impulsivity, c/o pain with attempts to weight bear left LE (refers to "ass" when asked what hurts) Ambulation/Gait Ambulation/Gait: No (due to patient refusal, impulsive, sits down, then lies down)    Posture/Postural Control Posture/Postural Control: No significant limitations Static Sitting Balance Static Sitting - Balance Support: Bilateral upper extremity supported;Feet supported Static Sitting - Level of Assistance: 4: Min assist Static Sitting - Comment/# of Minutes: sat approx 4 minutes with assist to maintain upright due to decreased cooperation with OOB Exercise    End of Session PT - End of Session Equipment Utilized During Treatment: Gait belt Activity Tolerance: Patient limited by pain;Treatment limited secondary to agitation Patient left: in bed;with call bell in reach General Behavior During Session: Other (comment) (slightly agitated, not wanting to participate, calms in bed) Cognition: Impaired, at baseline  [facility] 11/30/2011, 2:22 PM

## 2011-12-15 IMAGING — CR DG CHEST 1V PORT
1 series · 1 of 1 positions shown · non-contrast
Comparison: 10/07/2011

CLINICAL DATA: Addison's disease.  Tachycardia.  Weakness.

PORTABLE CHEST - 1 VIEW

[view not recorded]
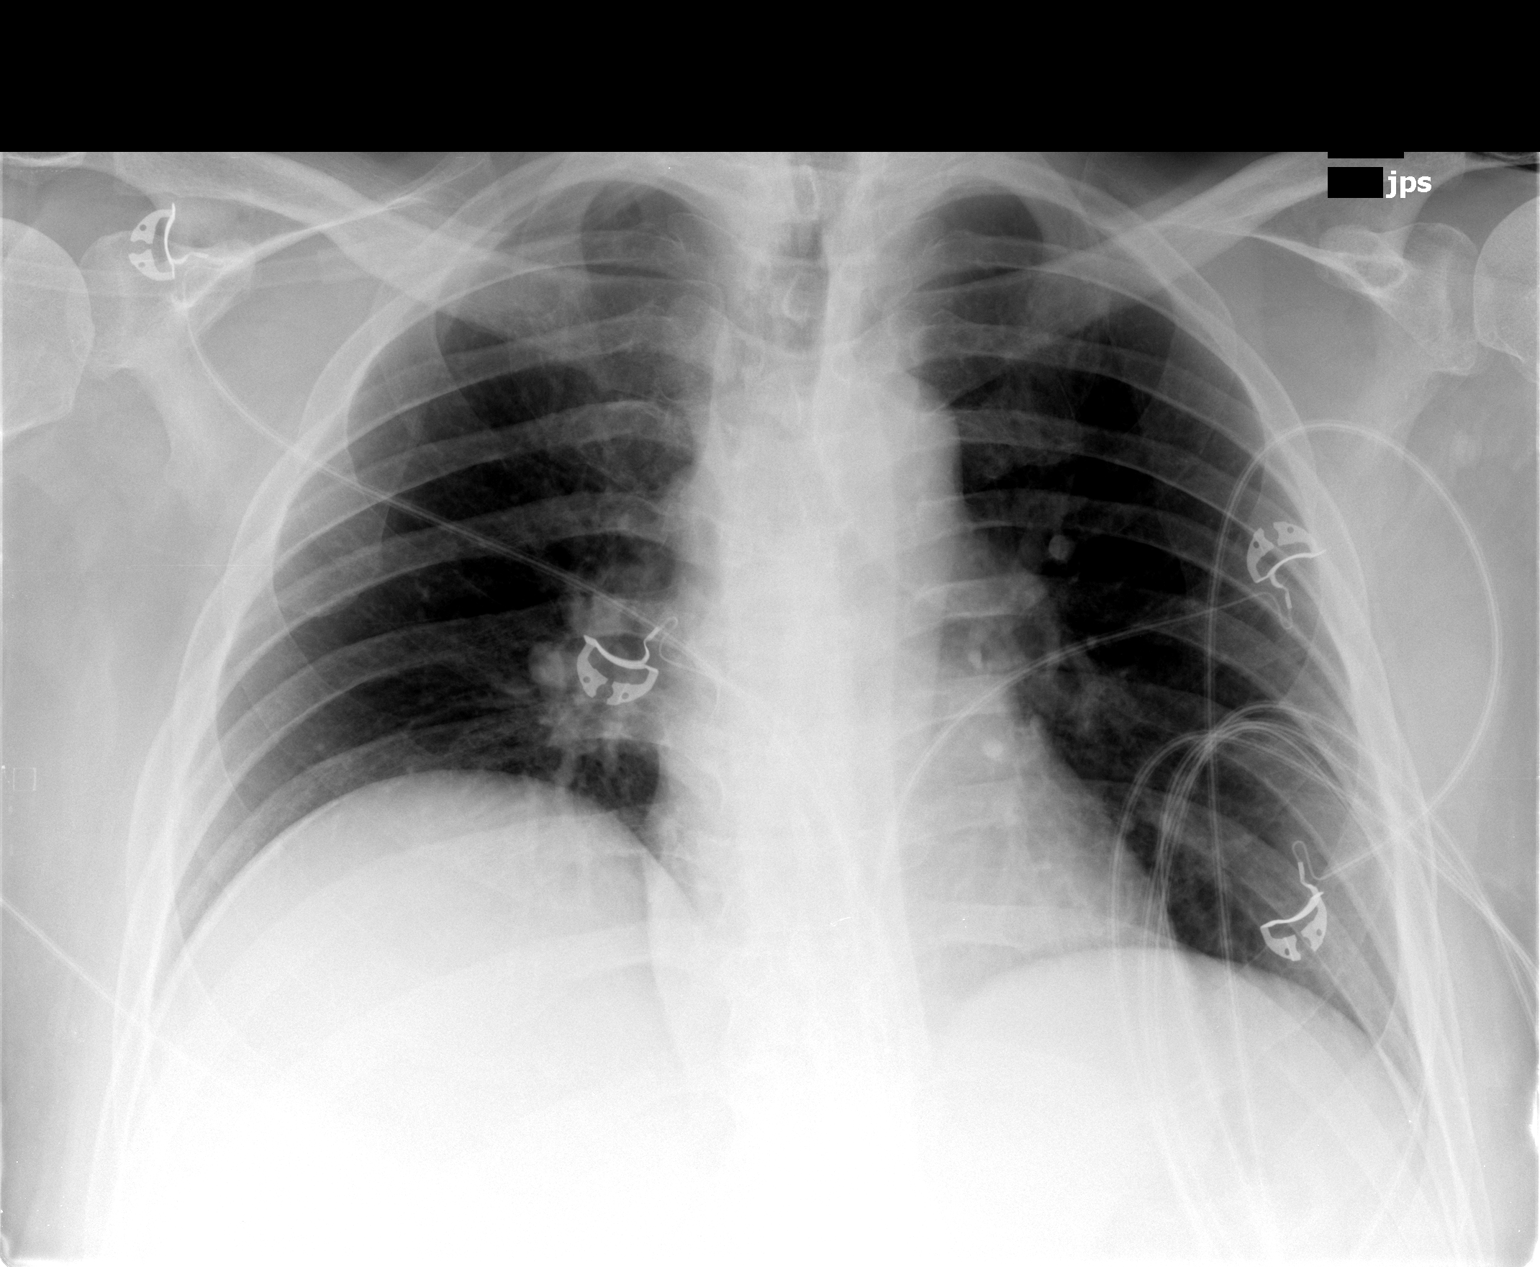

[1 of 1 positions shown; findings below may reference images not displayed]

FINDINGS: Low lung volumes as seen however both lungs are clear.
Heart size is within normal limits.  No evidence of pleural
effusion.
IMPRESSION: Low lung volumes.  No active disease.

## 2011-12-15 IMAGING — CT CT HEAD W/O CM
1 of 2 series · 16 of 30 positions shown, 20 images · non-contrast
Comparison: Head CT 10/04/2011

CLINICAL DATA: Fatigue

CT HEAD WITHOUT CONTRAST
TECHNIQUE: Contiguous axial images were obtained from the base of
the skull through the vertex without contrast.

[Series 3: head trauma 2.4 h60s · axial · 0.47mm/px · z∈[-119,+36]mm · 16 of 72 slices shown, 20 images]
[im 4/72  brain]
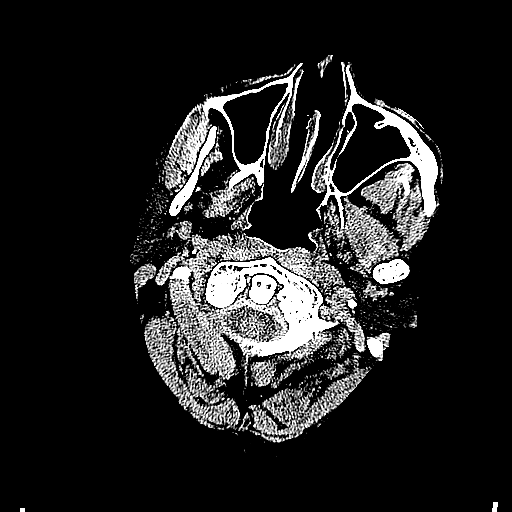
[im 4/72  bone]
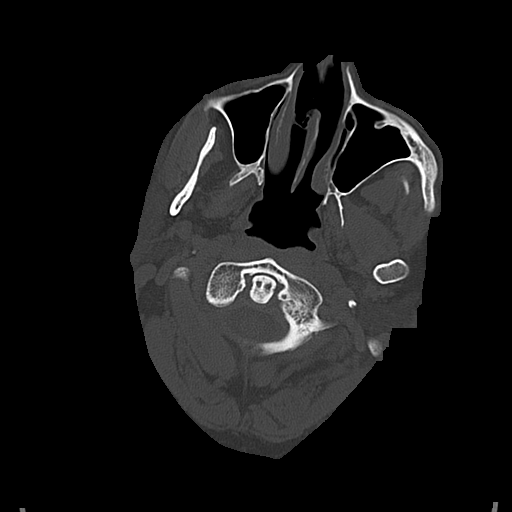
[im 8/72  brain]
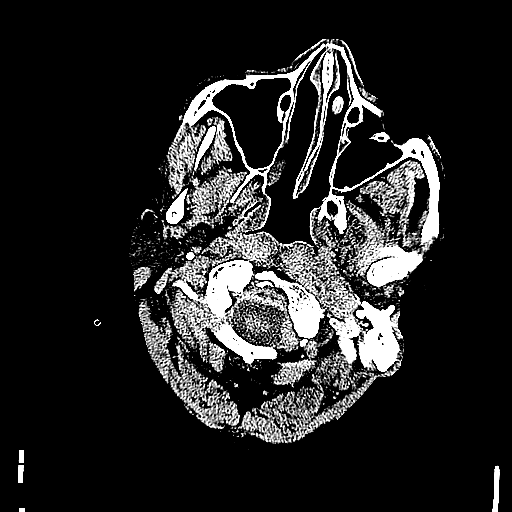
[im 12/72  brain]
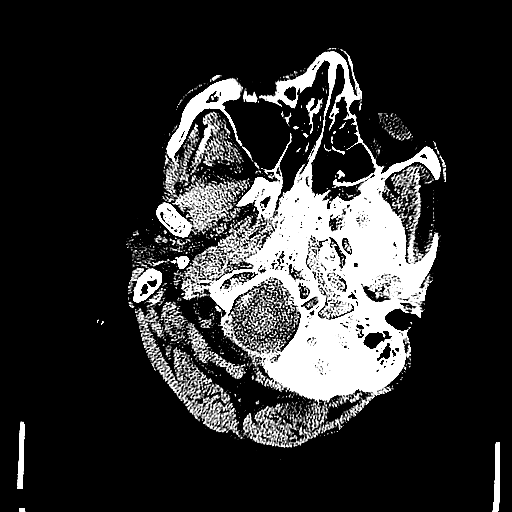
[im 15/72  brain]
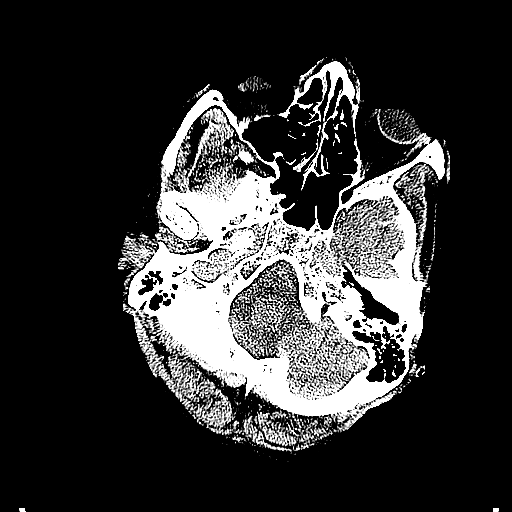
[im 23/72  brain]
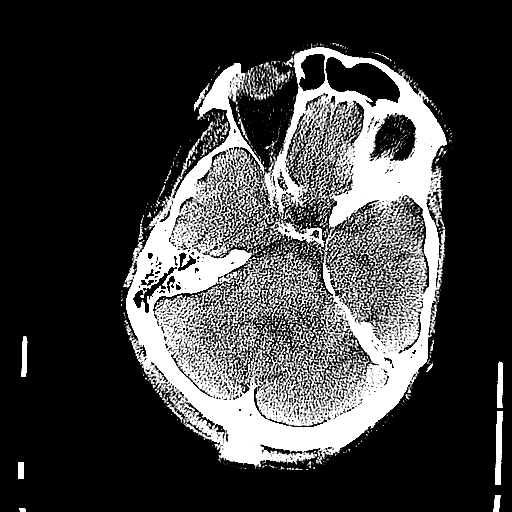
[im 23/72  bone]
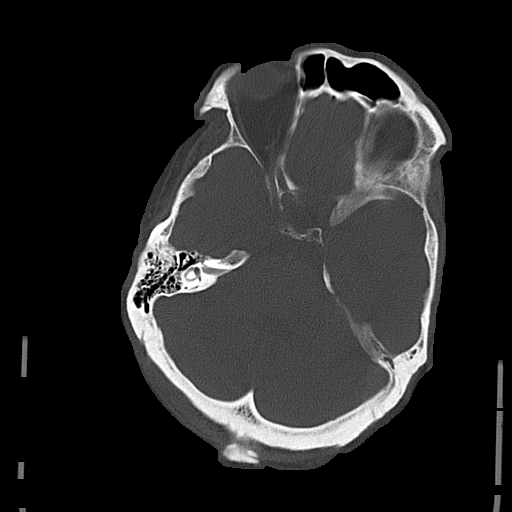
[im 27/72  brain]
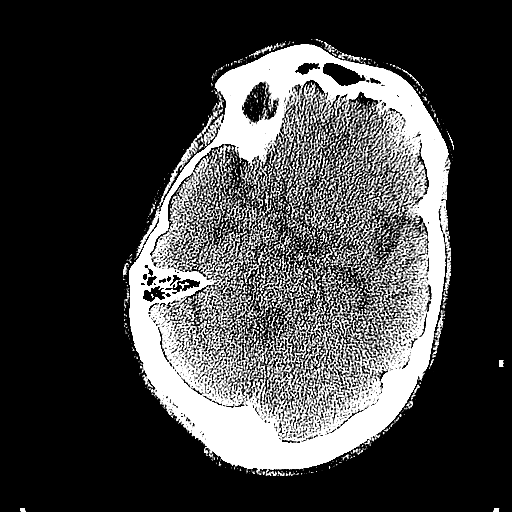
[im 30/72  brain]
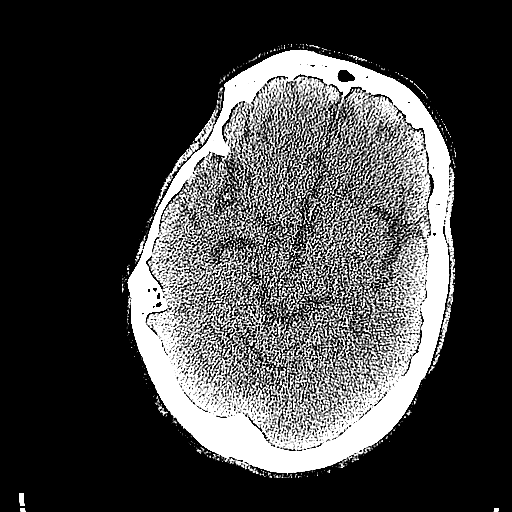
[im 34/72  brain]
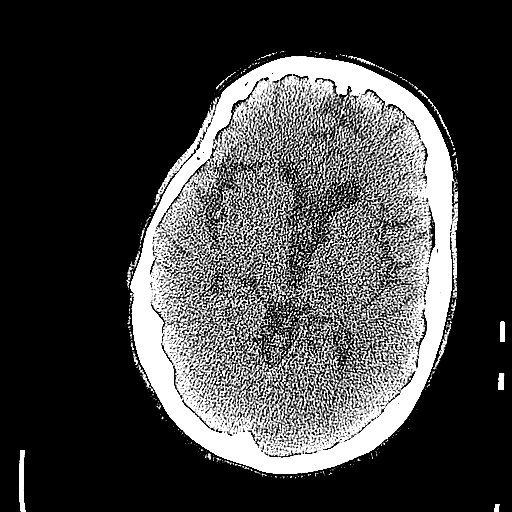
[im 38/72  brain]
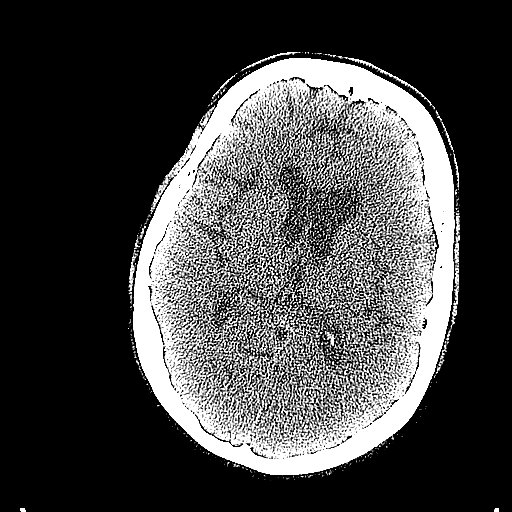
[im 38/72  bone]
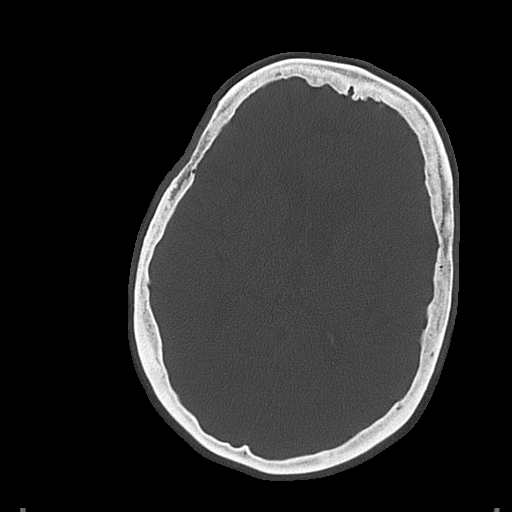
[im 42/72  brain]
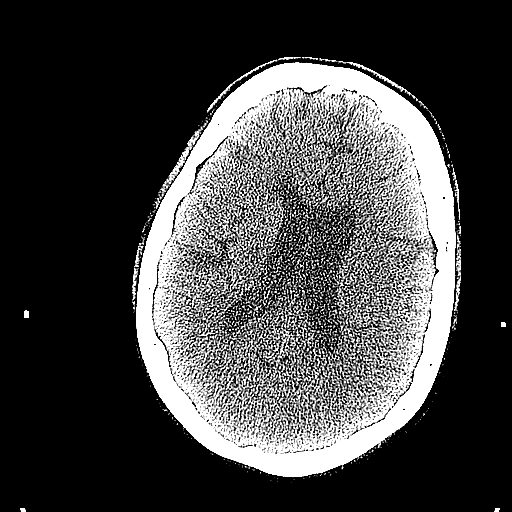
[im 45/72  brain]
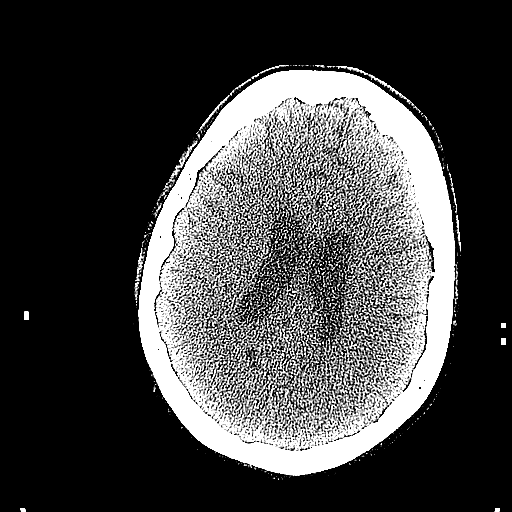
[im 49/72  brain]
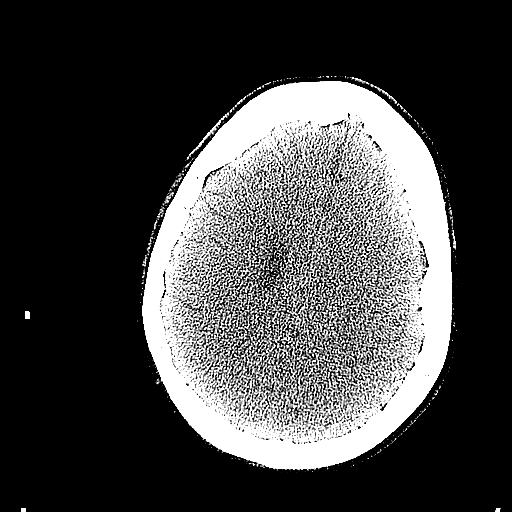
[im 57/72  brain]
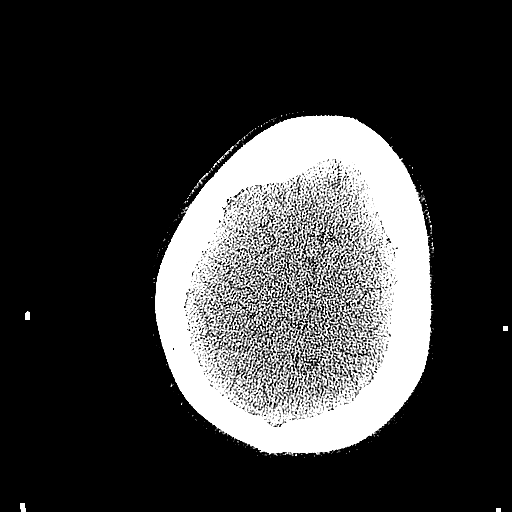
[im 57/72  bone]
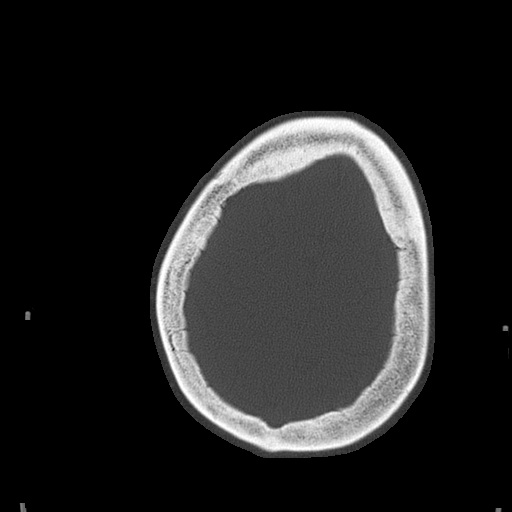
[im 60/72  brain]
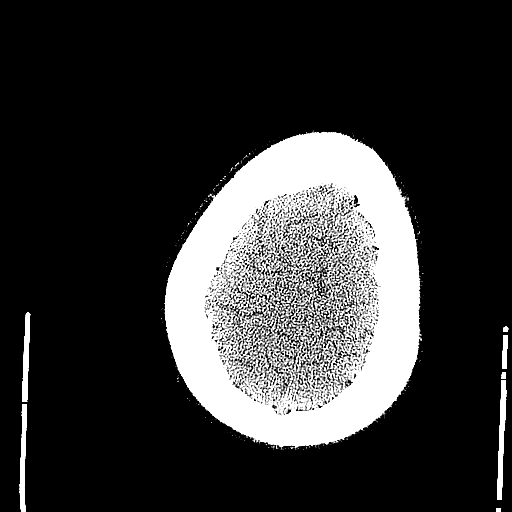
[im 64/72  brain]
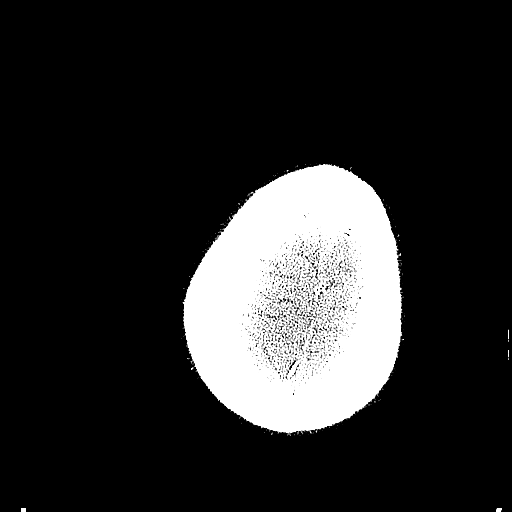
[im 68/72  brain]
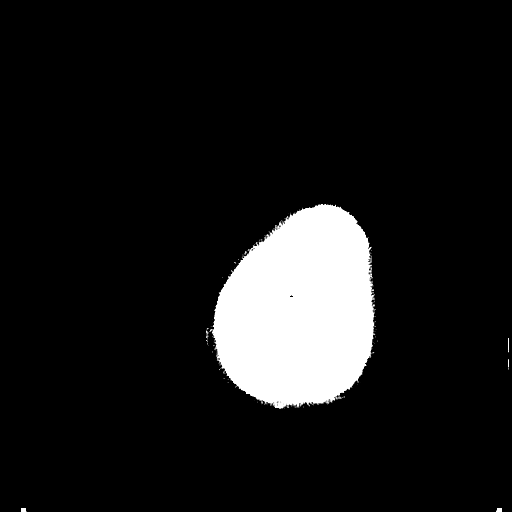

[16 of 30 positions shown; findings below may reference images not displayed]

FINDINGS: No acute intracranial hemorrhage.  No focal mass lesion.
No CT evidence of acute infarction.   No midline shift or mass
effect.  No hydrocephalus.  Basilar cisterns are patent. Age
advanced atrophy is similar to prior. Paranasal sinuses and mastoid
air cells are clear.  Orbits are normal.
IMPRESSION: No acute intracranial findings.  No change from prior.

## 2011-12-17 IMAGING — US IR FLUORO GUIDE CV MIDLINE PICC *L*
1 series · 1 of 1 positions shown · non-contrast
Comparison: none

CLINICAL DATA: Renal failure, UTI, needs access for antibiotics

PICC PLACEMENT WITH ULTRASOUND AND FLUOROSCOPY
TECHNIQUE: After written informed consent was obtained, patient was
placed in the supine position on angiographic table. Patency of the
left brachial vein was confirmed with ultrasound with image
documentation. An appropriate skin site was determined. Skin site
was marked. Region was prepped using maximum barrier technique
including cap and mask, sterile gown, sterile gloves, large sterile
sheet, and Chlorhexidine   as cutaneous antisepsis.  The region was
infiltrated locally with 1% lidocaine.   Under real-time ultrasound
guidance, the left brachial vein was accessed with a 21 gauge
micropuncture needle; the needle tip within the vein was confirmed
with ultrasound image documentation.   Needle exchanged over a 018
guidewire for a peel-away sheath, through which a 5-French dual-
lumen power injectable PICC trimmed to 43cm was advanced,
positioned with its tip near the cavoatrial junction.  Spot chest
radiograph confirms appropriate catheter position.  Catheter was
flushed per protocol and secured externally with 0-Prolene sutures.
The patient tolerated procedure well, with no immediate
complication.

[Series 1: ir fluoro guide cv midline picc *left* · 1 of 1 slices shown]
[im 1/1]
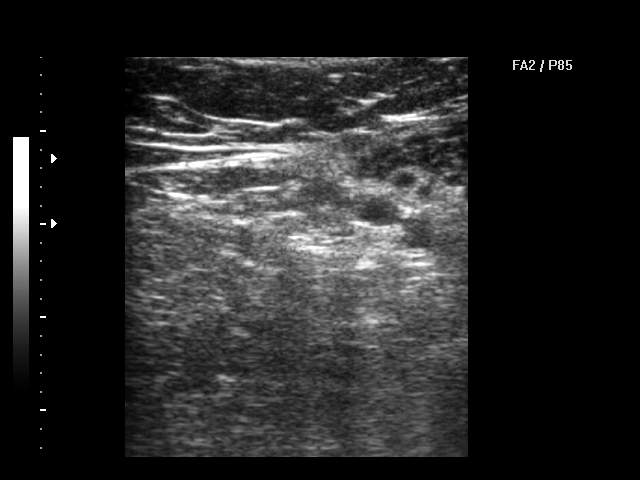

[1 of 1 positions shown; findings below may reference images not displayed]

IMPRESSION: Technically successful five French dual  lumen power injectable
PICC placement

## 2011-12-21 ENCOUNTER — Emergency Department (HOSPITAL_COMMUNITY): Payer: Medicare Other

## 2011-12-21 ENCOUNTER — Encounter (HOSPITAL_COMMUNITY): Payer: Self-pay | Admitting: Emergency Medicine

## 2011-12-21 ENCOUNTER — Inpatient Hospital Stay (HOSPITAL_COMMUNITY)
Admission: EM | Admit: 2011-12-21 | Discharge: 2011-12-24 | DRG: 393 | Disposition: A | Discharge status: Skilled Nursing Facility | Payer: Medicare Other | Attending: Internal Medicine | Admitting: Internal Medicine

## 2011-12-21 DIAGNOSIS — K9423 Gastrostomy malfunction: Principal | ICD-10-CM | POA: Diagnosis present

## 2011-12-21 DIAGNOSIS — Z905 Acquired absence of kidney: Secondary | ICD-10-CM

## 2011-12-21 DIAGNOSIS — Z431 Encounter for attention to gastrostomy: Secondary | ICD-10-CM

## 2011-12-21 DIAGNOSIS — G931 Anoxic brain damage, not elsewhere classified: Secondary | ICD-10-CM | POA: Diagnosis present

## 2011-12-21 DIAGNOSIS — E2749 Other adrenocortical insufficiency: Secondary | ICD-10-CM | POA: Diagnosis present

## 2011-12-21 DIAGNOSIS — Y833 Surgical operation with formation of external stoma as the cause of abnormal reaction of the patient, or of later complication, without mention of misadventure at the time of the procedure: Secondary | ICD-10-CM | POA: Diagnosis present

## 2011-12-21 DIAGNOSIS — Z8701 Personal history of pneumonia (recurrent): Secondary | ICD-10-CM

## 2011-12-21 DIAGNOSIS — E785 Hyperlipidemia, unspecified: Secondary | ICD-10-CM | POA: Diagnosis present

## 2011-12-21 DIAGNOSIS — E039 Hypothyroidism, unspecified: Secondary | ICD-10-CM | POA: Diagnosis present

## 2011-12-21 DIAGNOSIS — R339 Retention of urine, unspecified: Secondary | ICD-10-CM | POA: Diagnosis present

## 2011-12-21 DIAGNOSIS — R109 Unspecified abdominal pain: Secondary | ICD-10-CM

## 2011-12-21 DIAGNOSIS — K668 Other specified disorders of peritoneum: Secondary | ICD-10-CM

## 2011-12-21 DIAGNOSIS — G8250 Quadriplegia, unspecified: Secondary | ICD-10-CM | POA: Diagnosis present

## 2011-12-21 DIAGNOSIS — F411 Generalized anxiety disorder: Secondary | ICD-10-CM | POA: Diagnosis present

## 2011-12-21 DIAGNOSIS — N390 Urinary tract infection, site not specified: Secondary | ICD-10-CM | POA: Diagnosis present

## 2011-12-21 DIAGNOSIS — R1310 Dysphagia, unspecified: Secondary | ICD-10-CM | POA: Diagnosis present

## 2011-12-21 DIAGNOSIS — H409 Unspecified glaucoma: Secondary | ICD-10-CM | POA: Diagnosis present

## 2011-12-21 DIAGNOSIS — I252 Old myocardial infarction: Secondary | ICD-10-CM

## 2011-12-21 DIAGNOSIS — Z7189 Other specified counseling: Secondary | ICD-10-CM

## 2011-12-21 DIAGNOSIS — K219 Gastro-esophageal reflux disease without esophagitis: Secondary | ICD-10-CM | POA: Diagnosis present

## 2011-12-21 DIAGNOSIS — I4891 Unspecified atrial fibrillation: Secondary | ICD-10-CM | POA: Diagnosis present

## 2011-12-21 DIAGNOSIS — G40909 Epilepsy, unspecified, not intractable, without status epilepticus: Secondary | ICD-10-CM | POA: Diagnosis present

## 2011-12-21 DIAGNOSIS — Z79899 Other long term (current) drug therapy: Secondary | ICD-10-CM

## 2011-12-21 DIAGNOSIS — Z880 Allergy status to penicillin: Secondary | ICD-10-CM

## 2011-12-21 DIAGNOSIS — Z87440 Personal history of urinary (tract) infections: Secondary | ICD-10-CM

## 2011-12-21 DIAGNOSIS — E860 Dehydration: Secondary | ICD-10-CM | POA: Diagnosis present

## 2011-12-21 HISTORY — DX: Other specified disorders of peritoneum: K66.8

## 2011-12-21 HISTORY — DX: Acute myocardial infarction, unspecified: I21.9

## 2011-12-21 LAB — COMPREHENSIVE METABOLIC PANEL
ALT: 11 U/L (ref 0–53)
AST: 15 U/L (ref 0–37)
Albumin: 3.6 g/dL (ref 3.5–5.2)
Alkaline Phosphatase: 73 U/L (ref 39–117)
CO2: 21 mEq/L (ref 19–32)
Chloride: 105 mEq/L (ref 96–112)
Creatinine, Ser: 0.96 mg/dL (ref 0.50–1.35)
GFR calc non Af Amer: >90 mL/min (ref >90)
Potassium: 3.5 mEq/L (ref 3.5–5.1)
Sodium: 139 mEq/L (ref 135–145)
Total Bilirubin: 0.8 mg/dL (ref 0.3–1.2)

## 2011-12-21 LAB — URINALYSIS, ROUTINE W REFLEX MICROSCOPIC
Glucose, UA: NEGATIVE mg/dL
Hgb urine dipstick: NEGATIVE
pH: 7.5 (ref 5.0–8.0)

## 2011-12-21 LAB — CBC
HCT: 39.7 % (ref 39.0–52.0)
MCHC: 33.5 g/dL (ref 30.0–36.0)
RDW: 13.7 % (ref 11.5–15.5)
WBC: 8.5 10*3/uL (ref 4.0–10.5)

## 2011-12-21 LAB — LACTIC ACID, PLASMA: Lactic Acid, Venous: 1.1 mmol/L (ref 0.5–2.2)

## 2011-12-21 LAB — DIFFERENTIAL
Basophils Absolute: 0 10*3/uL (ref 0.0–0.1)
Basophils Relative: 0 % (ref 0–1)
Lymphocytes Relative: 23 % (ref 12–46)
Neutro Abs: 5.2 10*3/uL (ref 1.7–7.7)
Neutrophils Relative %: 62 % (ref 43–77)

## 2011-12-21 IMAGING — CR DG CHEST 1V PORT
1 series · 1 of 1 positions shown · non-contrast
Comparison: 11/12/2011

CLINICAL DATA: Cough

PORTABLE CHEST - 1 VIEW

[AP]
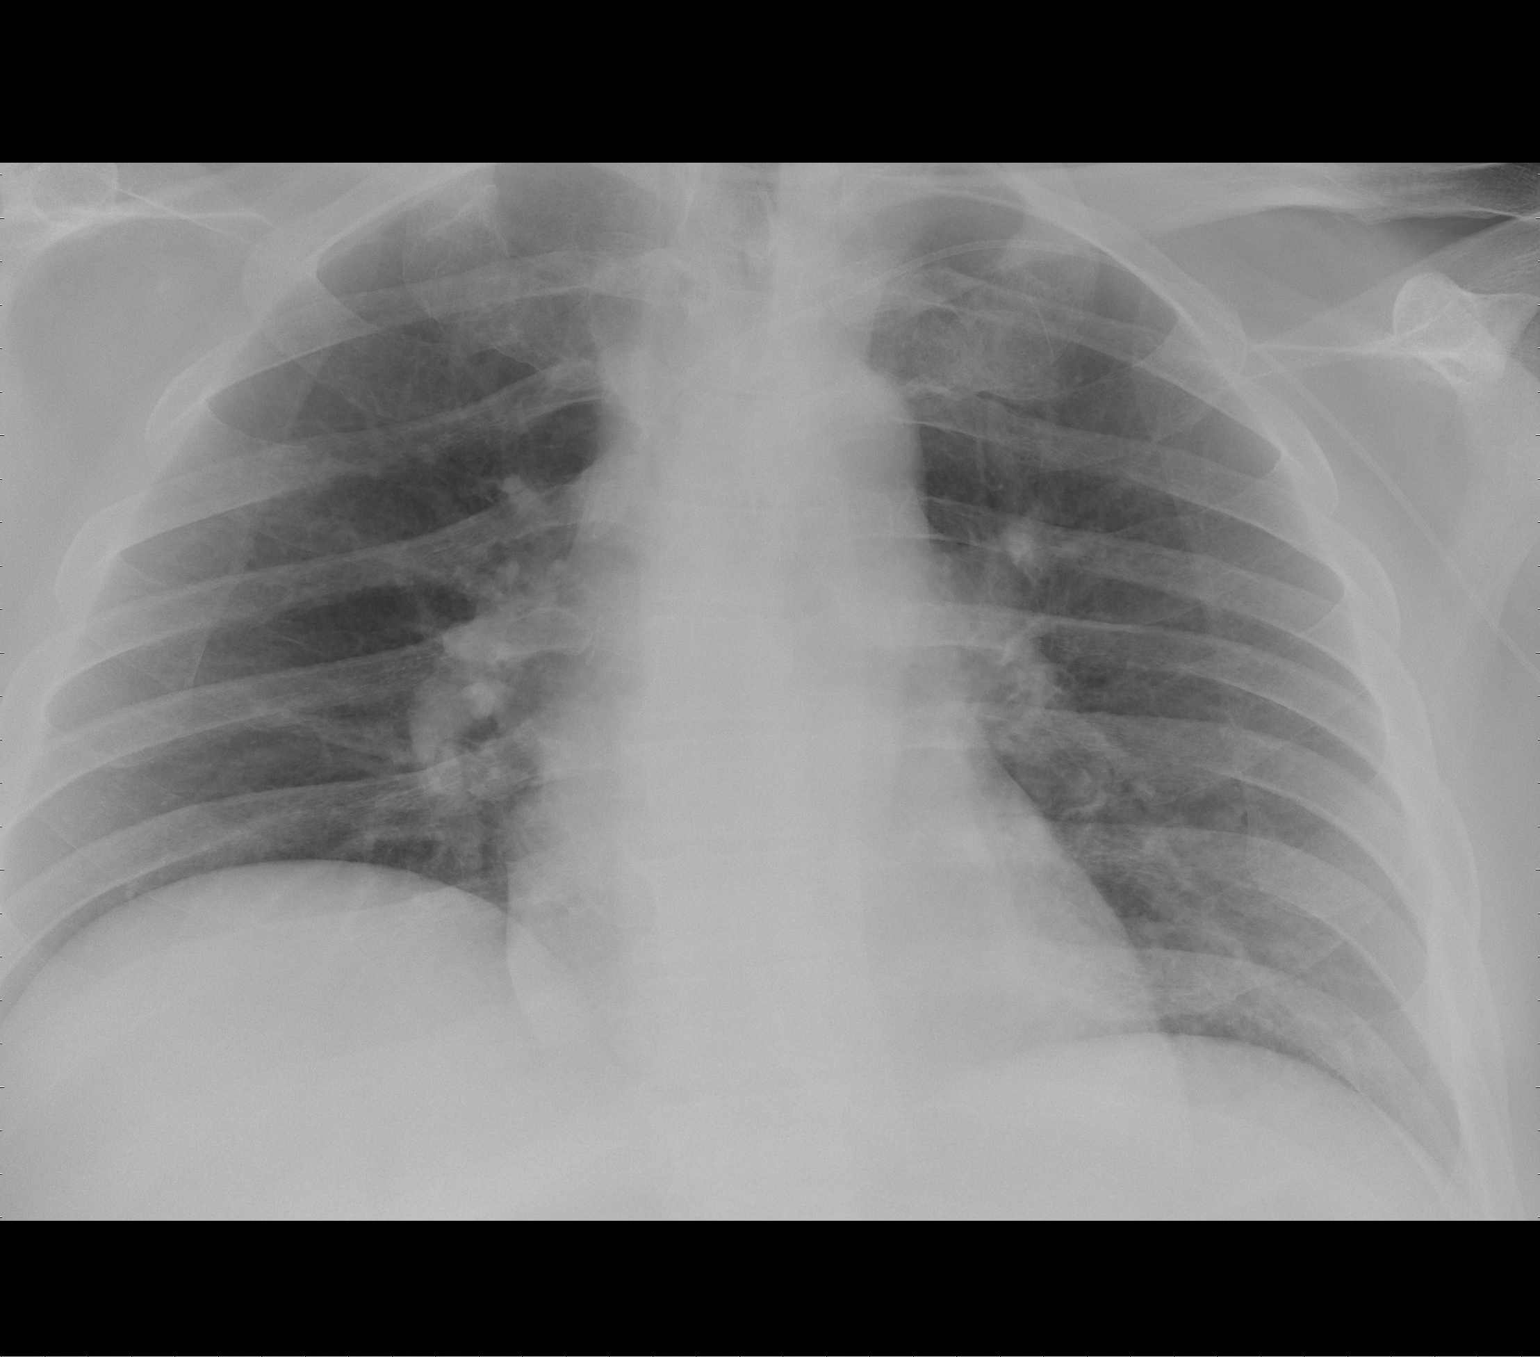

[1 of 1 positions shown; findings below may reference images not displayed]

FINDINGS: Heart size is within normal limits.  Negative for heart
failure or pneumonia.  Lungs are clear.

Left arm PICC tip in the SVC
IMPRESSION: PICC tip in the SVC.  No acute cardiopulmonary disease.

## 2011-12-21 MED ORDER — ONDANSETRON HCL 4 MG/2ML IJ SOLN
4.0000 mg | Freq: Three times a day (TID) | INTRAMUSCULAR | Status: DC | PRN
  Filled 2011-12-21: qty 2

## 2011-12-21 MED ORDER — SODIUM CHLORIDE 0.9 % IV BOLUS (SEPSIS)
1000.0000 mL | Freq: Once | INTRAVENOUS | Status: AC
  Administered 2011-12-21: 1000 mL via INTRAVENOUS

## 2011-12-21 MED ORDER — SODIUM CHLORIDE 0.9 % IV SOLN
1.0000 g | Freq: Once | INTRAVENOUS | Status: AC
  Administered 2011-12-21: 1 g via INTRAVENOUS
  Filled 2011-12-21: qty 1

## 2011-12-21 MED ORDER — FENTANYL CITRATE 0.05 MG/ML IJ SOLN
50.0000 ug | Freq: Once | INTRAMUSCULAR | Status: AC
  Administered 2011-12-21: 50 ug via INTRAVENOUS
  Filled 2011-12-21: qty 2

## 2011-12-21 MED ORDER — HYDROMORPHONE HCL PF 1 MG/ML IJ SOLN
1.0000 mg | Freq: Once | INTRAMUSCULAR | Status: DC
  Filled 2011-12-21: qty 1

## 2011-12-21 MED ORDER — CEFTRIAXONE SODIUM 1 G IJ SOLR: 1.0000 g | Freq: Once | INTRAMUSCULAR | Status: DC

## 2011-12-21 MED ORDER — HYDROMORPHONE HCL PF 1 MG/ML IJ SOLN
2.0000 mg | INTRAMUSCULAR | Status: DC | PRN
  Administered 2011-12-21: 1 mg via INTRAVENOUS
  Filled 2011-12-21: qty 1

## 2011-12-21 MED ORDER — SODIUM CHLORIDE 0.9 % IV SOLN
INTRAVENOUS | Status: AC
  Administered 2011-12-21: via INTRAVENOUS

## 2011-12-21 MED ORDER — FENTANYL CITRATE 0.05 MG/ML IJ SOLN
50.0000 ug | Freq: Once | INTRAMUSCULAR | Status: AC
  Administered 2011-12-21: 50 ug via INTRAVENOUS

## 2011-12-21 NOTE — Consult Note (Signed)
Reason for Consult: Abdominal pain, free air on CT scan Referring Physician: Dr. Dayton Bailiff, ER  [name] is an 45 y.o. male.  HPI: Pt is a 45 yo white male resident of [facility] sent to ER for assessment of 48 hour history of abdominal pain.  History is limited by the patient's inability to communicate significantly, no records available, and no family present.  Apparently patient had a PEG tube place 4 weeks ago by Interventional Radiology at the family's request to support nutrition and avoid dehydration.  Pt seen in ER today and CT abd shows areas of free air within the abdomen with no clear source.  Inflammatory changes surround PEG tube and tract.  Tube injection obtained in radiology shows tube in stomach without leak.  WBC normal.  No fever.  Asked to evaluate patient by ER physician for possible surgical intervention.  Past Medical History  Diagnosis Date  . Addison disease   . Anoxic brain injury   . A-fib   . Seizure disorder   . Dysphagia   . History of recurrent UTIs   . Hyperlipemia   . Aspiration pneumonia   . Mental disorder   . Anxiety   . Reflux   . Dysphagia   . Glucocorticoid deficiency   . Atrial fibrillation   . Glaucoma   . Hypothyroidism   . Encephalopathy   . Quadriplegia   . Addison disease   . Myocardial infarction     Past Surgical History  Procedure Date  . Nephrectomy unknown    History reviewed. No pertinent family history.  Social History:  reports that he has never smoked. He has never used smokeless tobacco. He reports that he does not drink alcohol or use illicit drugs.  Allergies:  Allergies  Allergen Reactions  . Latex Dermatitis  . Morphine And Related Other (See Comments)    UNKNOWN  . Penicillins Other (See Comments)    UNKNOWN  . Pyridium (Phenazopyridine Hcl) Other (See Comments)    UNKNOWN  . Soy Allergy Other (See Comments)    UNKNOWN    Medications: see chart for current med list  Results for orders placed  during the hospital encounter of 12/21/11 (from the past 48 hour(s))  URINALYSIS, ROUTINE W REFLEX MICROSCOPIC     Status: Abnormal   Collection Time   12/21/11  5:55 PM      Component Value Range Comment   Color, Urine YELLOW  YELLOW     APPearance CLOUDY (*) CLEAR     Specific Gravity, Urine 1.028  1.005 - 1.030     pH 7.5  5.0 - 8.0     Glucose, UA NEGATIVE  NEGATIVE (mg/dL)    Hgb urine dipstick NEGATIVE  NEGATIVE     Bilirubin Urine NEGATIVE  NEGATIVE     Ketones, ur TRACE (*) NEGATIVE (mg/dL)    Protein, ur NEGATIVE  NEGATIVE (mg/dL)    Urobilinogen, UA 0.2  0.0 - 1.0 (mg/dL)    Nitrite NEGATIVE  NEGATIVE     Leukocytes, UA MODERATE (*) NEGATIVE    URINE MICROSCOPIC-ADD ON     Status: Abnormal   Collection Time   12/21/11  5:55 PM      Component Value Range Comment   WBC, UA 11-20  <3 (WBC/hpf)    Bacteria, UA FEW (*) RARE     Urine-Other MUCOUS PRESENT     LACTIC ACID, PLASMA     Status: Normal   Collection Time   12/21/11  7:32 PM      Component Value Range Comment   Lactic Acid, Venous 1.1  0.5 - 2.2 (mmol/L)   CBC     Status: Normal   Collection Time   12/21/11  7:33 PM      Component Value Range Comment   WBC 8.5  4.0 - 10.5 (K/uL)    RBC 4.75  4.22 - 5.81 (MIL/uL)    Hemoglobin 13.3  13.0 - 17.0 (g/dL)    HCT 56.2  13.0 - 86.5 (%)    MCV 83.6  78.0 - 100.0 (fL)    MCH 28.0  26.0 - 34.0 (pg)    MCHC 33.5  30.0 - 36.0 (g/dL)    RDW 78.4  69.6 - 29.5 (%)    Platelets 192  150 - 400 (K/uL)   DIFFERENTIAL     Status: Normal   Collection Time   12/21/11  7:33 PM      Component Value Range Comment   Neutrophils Relative 62  43 - 77 (%)    Neutro Abs 5.2  1.7 - 7.7 (K/uL)    Lymphocytes Relative 23  12 - 46 (%)    Lymphs Abs 2.0  0.7 - 4.0 (K/uL)    Monocytes Relative 11  3 - 12 (%)    Monocytes Absolute 0.9  0.1 - 1.0 (K/uL)    Eosinophils Relative 4  0 - 5 (%)    Eosinophils Absolute 0.4  0.0 - 0.7 (K/uL)    Basophils Relative 0  0 - 1 (%)    Basophils  Absolute 0.0  0.0 - 0.1 (K/uL)   COMPREHENSIVE METABOLIC PANEL     Status: Abnormal   Collection Time   12/21/11  7:33 PM      Component Value Range Comment   Sodium 139  135 - 145 (mEq/L)    Potassium 3.5  3.5 - 5.1 (mEq/L)    Chloride 105  96 - 112 (mEq/L)    CO2 21  19 - 32 (mEq/L)    Glucose, Bld 72  70 - 99 (mg/dL)    BUN 24 (*) 6 - 23 (mg/dL)    Creatinine, Ser 2.84  0.50 - 1.35 (mg/dL)    Calcium 9.8  8.4 - 10.5 (mg/dL)    Total Protein 7.8  6.0 - 8.3 (g/dL)    Albumin 3.6  3.5 - 5.2 (g/dL)    AST 15  0 - 37 (U/L)    ALT 11  0 - 53 (U/L)    Alkaline Phosphatase 73  39 - 117 (U/L)    Total Bilirubin 0.8  0.3 - 1.2 (mg/dL)    GFR calc non Af Amer >90  >90 (mL/min)    GFR calc Af Amer >90  >90 (mL/min)   LIPASE, BLOOD     Status: Normal   Collection Time   12/21/11  7:33 PM      Component Value Range Comment   Lipase 39  11 - 59 (U/L)     Ct Abdomen Pelvis Wo Contrast  12/21/2011  *RADIOLOGY REPORT*  Clinical Data: Unable to urinate, history of Addison's disease and seizures  CT ABDOMEN AND PELVIS WITHOUT CONTRAST  Technique:  Multidetector CT imaging of the abdomen and pelvis was performed following the standard protocol without intravenous contrast.  Comparison: CT abdomen and pelvis of 10/05/2011.  Findings:   The lung bases are clear.  However, well seen on the lung window images there is a significant amount of free intraperitoneal air  present there is a gastrostomy tube within the left upper quadrant which appears to be within the body of the stomach.  This intraperitoneal air could be due to the gastrostomy if there was recent placement.  However there is air scattered throughout the peritoneal cavity and perforated viscus cannot be excluded.  There are prominent soft tissues surrounding the entrance site of the gastrostomy tube which may indicate local cellulitis.  The liver is unremarkable.  Surgical clips are present from prior cholecystectomy.  The pancreas is normal in  size and the pancreatic duct is not dilated.  The adrenal glands and spleen are unremarkable.  The right kidney has been surgically resected previously with multiple clips in the right retroperitoneum.  The left kidney is unremarkable in the unenhanced state with no calculi, mass, or hydronephrosis noted.  The abdominal aorta is normal in caliber.  There is some laxity of the right posterolateral abdominal wall probably due to prior surgery for right nephrectomy.  The urinary bladder is decompressed with Foley catheter.  There is an unusual linear air shadow within the rectum of questionable significance.  I discussed this finding with Dr. Dayton Bailiff in the [facility] emergency department at 7:00 p.Strickland. on 12/21/2011.  A foreign body cannot be excluded.  The prostate is normal in size.  There is a 7 mm anterolisthesis of L5-1 as well with degenerative disc disease at L5-L1.  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 No acute bony abnormality is seen.  IMPRESSION:  1.  Free intraperitoneal air.  This may be due to the gastrostomy tube, but perforated bowel cannot be excluded. 2. Cannot exclude foreign body in the rectum.  Correlate clinically. 3.  Anterolisthesis of L5 on S1 with bilateral pars defects.  Original Report Authenticated By: Juline Patch, Strickland.D.   Dg Abd 1 View  12/21/2011  *RADIOLOGY REPORT*  Clinical Data: G tube placement confirmation  ABDOMEN - 1 VIEW  Comparison: CT 12/21/2011  Findings: Hand injection of small volume water-soluble contrast through the percutaneous gastrostomy tube layers dependently within the stomach.  IMPRESSION: Injection of small volume of contrast fills the stomach.  Original Report Authenticated By: Genevive Bi, Strickland.D.   Dg Chest Port 1 View  12/21/2011  *RADIOLOGY REPORT*  Clinical Data: Evaluate PICC line placed  PORTABLE CHEST - 1 VIEW  Comparison: Portable chest x-ray of 11/18/2011  Findings: The tip of the left PICC line appears to be within the left subclavian  vein, overlying the left medial axilla.  This could be advanced 10-12 cm to lie within the lower SVC.  No pneumothorax is seen.  Mediastinal contours are stable.  The heart is within normal limits in size.  No bony abnormality is noted.  IMPRESSION: Left PICC line tip seen to the left subclavian vein region.  This could be advanced 10-12 cm.  Original Report Authenticated By: Juline Patch, Strickland.D.   Exam: HEENT - not icteric, mucous membranes very dry Neck - surgical wound lower anterior neck well-healed Chest - clear bilat Cor - rate in 80's Abd - scaphoid, BS present; mild diffuse tenderness, poorly localized; PEG in LUQ - collar adjusted to fit GU - normal male Ext - no edema, drsg on left ankle Neuro - responds to simple questions yes or no; agitated  Assessment: 1.  Abdominal pain of undetermined etiology 2.  Recent PEG placement with inflammatory changes on CT scan, possible small leak with scattered free air 3.  History of anoxic brain injury - ?  Etiology 4.  Seizure disorder  Plan: Agree with admission to medical service for IV hydration, empiric antibiotic therapy, and observation.  Surgery will follow closely with you.  No indication for acute surgical intervention at present.  May need to re-study PEG tube with formal UGI series to confirm proper position and no complications or leak when full staff available on 12/23/2011.  Doubt any rectal foreign body despite changes seen on CT.  Velora Heckler, MD, FACS General & Endocrine Surgery [facility]   [name] 12/21/2011, 9:26 PM

## 2011-12-21 NOTE — ED Notes (Signed)
According to radiology report on 11/15/11 pt's PICC is a Power Picc with dual lumen

## 2011-12-21 NOTE — ED Notes (Signed)
[name]:[id number]<JY> Expected date:12/21/11<BR> Expected time: 5:15 PM<BR> Means of arrival:Ambulance<BR> Comments:<BR> EMS 32 PTAR, unable to urinate

## 2011-12-21 NOTE — ED Notes (Signed)
Per EMS- Pt. Is a resident of [place]. Pt. Has not urinated in past 16 hours. Nsg home attempted x 3 to cath with no success.

## 2011-12-21 NOTE — ED Provider Notes (Signed)
History     CSN: [id number]  Arrival date & time 12/21/11  1717   First MD Initiated Contact with Patient 12/21/11 1727      Chief Complaint  Patient presents with  . Urinary Retention  . Abdominal Pain    (Consider location/radiation/quality/duration/timing/severity/associated sxs/prior treatment) HPI Comments: 45 year old male history of anoxic brain injury presents from [facility] for the complaint of urinary retention. He rides uncomfortable appearing complaining of abdominal and back pain. He has not had any urinary output for 16 hours. Urinary catheter was attempted to be placed x3 without success. Denies fevers. Has history of frequent urinary tract infections. Patient unable to communicate secondary to his medical problems. Patient also with a G-tube in his abdomen.  The history is provided by the nursing home. No language interpreter was used.    Past Medical History  Diagnosis Date  . Addison disease   . Anoxic brain injury   . A-fib   . Seizure disorder   . Dysphagia   . History of recurrent UTIs   . Hyperlipemia   . Aspiration pneumonia   . Mental disorder   . Anxiety   . Reflux   . Dysphagia   . Glucocorticoid deficiency   . Atrial fibrillation   . Glaucoma   . Hypothyroidism   . Encephalopathy   . Quadriplegia   . Addison disease   . Myocardial infarction     Past Surgical History  Procedure Date  . Nephrectomy unknown    History reviewed. No pertinent family history.  History  Substance Use Topics  . Smoking status: Never Smoker   . Smokeless tobacco: Never Used  . Alcohol Use: No      Review of Systems  Unable to perform ROS: Other  Constitutional:       Anoxic brain injury  Genitourinary: Positive for flank pain, decreased urine volume and difficulty urinating.    Allergies  Latex; Morphine and related; Penicillins; Pyridium; and Soy allergy  Home Medications   Current Outpatient Rx  Name Route Sig Dispense Refill  .  CITALOPRAM HYDROBROMIDE 20 MG PO TABS Oral Take 20 mg by mouth daily.      Marland Kitchen PRO-STAT 64 PO LIQD Oral Take 30 mLs by mouth 2 (two) times daily.      Marland Kitchen FERROUS SULFATE 325 (65 FE) MG PO TABS Oral Take 325 mg by mouth 3 (three) times daily with meals.      . OMEGA-3 FATTY ACIDS 1000 MG PO CAPS Oral Take 1 g by mouth 2 (two) times daily.      . IPRATROPIUM-ALBUTEROL 0.5-2.5 (3) MG/3ML IN SOLN Nebulization Take 3 mLs by nebulization every 6 (six) hours as needed. SHORTNESS OF BREATH     . METHIMAZOLE 5 MG PO TABS Oral Take 5 mg by mouth daily.      . METHYLCELLULOSE 1 % OP SOLN Both Eyes Place 1 drop into both eyes 4 (four) times daily.      Marland Kitchen METOPROLOL TARTRATE 25 MG/10 ML ORAL SUSPENSION Oral Take 5 mLs (12.5 mg total) by mouth 2 (two) times daily. 300 mL 0  . MINERAL OIL RE ENEM Rectal Place 1 enema rectally as needed. FOR CONSTIPATION     . VITAL 1.5 CAL PO LIQD Per Tube Place 1,000 mLs into feeding tube continuous. 30 Can 0  . PANTOPRAZOLE 40 MG/20 ML SUSPENSION Per Tube Place 20 mLs (40 mg total) into feeding tube daily at 12 noon. 30 each 0  . MULTI-DELYN PO  LIQD Per Tube Place 5 mLs into feeding tube daily. 1 Bottle 0  . POLYETHYLENE GLYCOL 3350 PO PACK Oral Take 17 g by mouth 2 (two) times daily.        SpO2 98%  Physical Exam  Nursing note and vitals reviewed. Constitutional: He appears well-developed and well-nourished.       uncomfortable appearing  HENT:  Head: Normocephalic and atraumatic.  Mouth/Throat: Oropharynx is clear and moist.  Eyes: Conjunctivae and EOM are normal. Pupils are equal, round, and reactive to light.  Neck: Normal range of motion. Neck supple.  Cardiovascular: Normal rate, regular rhythm, normal heart sounds and intact distal pulses.  Exam reveals no gallop and no friction rub.   No murmur heard. Pulmonary/Chest: Effort normal and breath sounds normal. No respiratory distress.  Abdominal: Soft. Bowel sounds are normal. There is tenderness. There is no  rebound and no guarding.       g tube in place  Genitourinary: Rectum normal and penis normal.       Foley drained when placed  Musculoskeletal: Normal range of motion. He exhibits no tenderness.  Neurological:       Anoxic brain injury with little ability to communicate    ED Course  Procedures (including critical care time)  CRITICAL CARE Performed by: Dayton Bailiff   Total critical care time: 30 min  Critical care time was exclusive of separately billable procedures and treating other patients.  Critical care was necessary to treat or prevent imminent or life-threatening deterioration.  Critical care was time spent personally by me on the following activities: development of treatment plan with patient and/or surrogate as well as nursing, discussions with consultants, evaluation of patient's response to treatment, examination of patient, obtaining history from patient or surrogate, ordering and performing treatments and interventions, ordering and review of laboratory studies, ordering and review of radiographic studies, pulse oximetry and re-evaluation of patient's condition.  Labs Reviewed  COMPREHENSIVE METABOLIC PANEL - Abnormal; Notable for the following:    BUN 24 (*)    All other components within normal limits  URINALYSIS, ROUTINE W REFLEX MICROSCOPIC - Abnormal; Notable for the following:    APPearance CLOUDY (*)    Ketones, ur TRACE (*)    Leukocytes, UA MODERATE (*)    All other components within normal limits  URINE MICROSCOPIC-ADD ON - Abnormal; Notable for the following:    Bacteria, UA FEW (*)    All other components within normal limits  CBC  DIFFERENTIAL  LIPASE, BLOOD  LACTIC ACID, PLASMA  URINE CULTURE   Ct Abdomen Pelvis Wo Contrast  12/21/2011  *RADIOLOGY REPORT*  Clinical Data: Unable to urinate, history of Addison's disease and seizures  CT ABDOMEN AND PELVIS WITHOUT CONTRAST  Technique:  Multidetector CT imaging of the abdomen and pelvis was  performed following the standard protocol without intravenous contrast.  Comparison: CT abdomen and pelvis of 10/05/2011.  Findings:   The lung bases are clear.  However, well seen on the lung window images there is a significant amount of free intraperitoneal air present there is a gastrostomy tube within the left upper quadrant which appears to be within the body of the stomach.  This intraperitoneal air could be due to the gastrostomy if there was recent placement.  However there is air scattered throughout the peritoneal cavity and perforated viscus cannot be excluded.  There are prominent soft tissues surrounding the entrance site of the gastrostomy tube which may indicate local cellulitis.  The liver is  unremarkable.  Surgical clips are present from prior cholecystectomy.  The pancreas is normal in size and the pancreatic duct is not dilated.  The adrenal glands and spleen are unremarkable.  The right kidney has been surgically resected previously with multiple clips in the right retroperitoneum.  The left kidney is unremarkable in the unenhanced state with no calculi, mass, or hydronephrosis noted.  The abdominal aorta is normal in caliber.  There is some laxity of the right posterolateral abdominal wall probably due to prior surgery for right nephrectomy.  The urinary bladder is decompressed with Foley catheter.  There is an unusual linear air shadow within the rectum of questionable significance.  I discussed this finding with Dr. Dayton Bailiff in the [facility] emergency department at 7:00 p.m. on 12/21/2011.  A foreign body cannot be excluded.  The prostate is normal in size.  There is a 7 mm anterolisthesis of L5-1 as well with degenerative disc disease at L5-L1.  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 No acute bony abnormality is seen.  IMPRESSION:  1.  Free intraperitoneal air.  This may be due to the gastrostomy tube, but perforated bowel cannot be excluded. 2. Cannot exclude foreign body in the  rectum.  Correlate clinically. 3.  Anterolisthesis of L5 on S1 with bilateral pars defects.  Original Report Authenticated By: Juline Patch, M.D.   Dg Abd 1 View  12/21/2011  *RADIOLOGY REPORT*  Clinical Data: G tube placement confirmation  ABDOMEN - 1 VIEW  Comparison: CT 12/21/2011  Findings: Hand injection of small volume water-soluble contrast through the percutaneous gastrostomy tube layers dependently within the stomach.  IMPRESSION: Injection of small volume of contrast fills the stomach.  Original Report Authenticated By: Genevive Bi, M.D.   Dg Chest Port 1 View  12/21/2011  *RADIOLOGY REPORT*  Clinical Data: Evaluate PICC line placed  PORTABLE CHEST - 1 VIEW  Comparison: Portable chest x-ray of 11/18/2011  Findings: The tip of the left PICC line appears to be within the left subclavian vein, overlying the left medial axilla.  This could be advanced 10-12 cm to lie within the lower SVC.  No pneumothorax is seen.  Mediastinal contours are stable.  The heart is within normal limits in size.  No bony abnormality is noted.  IMPRESSION: Left PICC line tip seen to the left subclavian vein region.  This could be advanced 10-12 cm.  Original Report Authenticated By: Juline Patch, M.D.     1. Abdominal pain   2. Pneumoperitoneum   3. Dehydration   4. UTI (urinary tract infection)       MDM  Patient presents initially for urinary retention. On placement of Foley catheter only 250 cc returned without resolution of the patient's pain. Therefore a CT scan was performed showing small amount of pneumoperitoneum. His ultrasound of urinary tract infection. Patient appears profoundly dehydrated on physical examination. He will be admitted for further evaluation and treatment. Surgery was consulted given the presence of pneumoperitoneum however given the absence of fever or white blood cell count elevation they will monitor his status rather than operate. He is placed on invanz. Will be admitted to  the hospitalist service        Dayton Bailiff, MD 12/21/11 2204

## 2011-12-21 NOTE — H&P (Signed)
PCP:   [name], MD, MD   Chief Complaint:  Abdominal pain  HPI: This is a 45 year old male, who is a resident of [facility] nursing home. Patient has suffered anoxic brain injury in the past. Patient recently had a PEG tube placed when he was unable to adequately take in enough fluids and nutrition. He is brought into the emergency room today with complaints of abdominal pain. He is unable to provide significant history to his mental status. There was concern for possible urinary retention as the cause of his abdominal pain. Foley catheter placed in the emergency room revealed 250 cc of urine. The patient's pain had not improved. He underwent CT scan of the abdomen and pelvis which showed free intraperitoneal air. He was seen by general surgery who felt that his recent PEG tube placement he have a possible small leak with scattered free air. It was felt at this time the patient did not need acute surgical intervention. Medical admission for IV fluids and antibiotics was recommended..  Allergies:   Allergies  Allergen Reactions  . Latex Dermatitis  . Morphine And Related Other (See Comments)    UNKNOWN  . Penicillins Other (See Comments)    UNKNOWN  . Pyridium (Phenazopyridine Hcl) Other (See Comments)    UNKNOWN  . Soy Allergy Other (See Comments)    UNKNOWN      Past Medical History  Diagnosis Date  . Addison disease   . Anoxic brain injury   . A-fib   . Seizure disorder   . Dysphagia   . History of recurrent UTIs   . Hyperlipemia   . Aspiration pneumonia   . Mental disorder   . Anxiety   . Reflux   . Dysphagia   . Glucocorticoid deficiency   . Atrial fibrillation   . Glaucoma   . Hypothyroidism   . Encephalopathy   . Quadriplegia   . Addison disease   . Myocardial infarction     Past Surgical History  Procedure Date  . Nephrectomy unknown    Prior to Admission medications   Medication Sig Start Date End Date Taking? Authorizing Provider    citalopram (CELEXA) 20 MG tablet Take 20 mg by mouth daily.     Yes Historical Provider, MD  feeding supplement (PRO-STAT SUGAR FREE 64) LIQD Take 30 mLs by mouth 2 (two) times daily.     Yes Historical Provider, MD  ferrous sulfate 325 (65 FE) MG tablet Take 325 mg by mouth 3 (three) times daily with meals.     Yes Historical Provider, MD  fish oil-omega-3 fatty acids 1000 MG capsule Take 1 g by mouth 2 (two) times daily.     Yes Historical Provider, MD  ipratropium-albuterol (DUONEB) 0.5-2.5 (3) MG/3ML SOLN Take 3 mLs by nebulization every 6 (six) hours as needed. SHORTNESS OF BREATH    Yes Historical Provider, MD  methimazole (TAPAZOLE) 5 MG tablet Take 5 mg by mouth daily.     Yes Historical Provider, MD  methylcellulose (ARTIFICIAL TEARS) 1 % ophthalmic solution Place 1 drop into both eyes 4 (four) times daily.     Yes Historical Provider, MD  metoprolol tartrate (LOPRESSOR) 25 mg/10 mL SUSP Take 5 mLs (12.5 mg total) by mouth 2 (two) times daily. 11/30/11  Yes Novlet Jarrett-Davis  mineral oil enema Place 1 enema rectally as needed. FOR CONSTIPATION    Yes Historical Provider, MD  Nutritional Supplements (FEEDING SUPPLEMENT, VITAL 1.5 CAL,) LIQD Place 1,000 mLs into feeding tube continuous.  11/30/11  Yes Novlet Jarrett-Davis  pantoprazole sodium (PROTONIX) 40 mg/20 mL PACK Place 20 mLs (40 mg total) into feeding tube daily at 12 noon. 11/30/11  Yes Novlet Jarrett-Davis  Pediatric Multiple Vitamins (MULTIVITAMIN) LIQD Place 5 mLs into feeding tube daily. 11/30/11  Yes Novlet Jarrett-Davis  polyethylene glycol (MIRALAX / GLYCOLAX) packet Take 17 g by mouth 2 (two) times daily.     Yes Historical Provider, MD    Social History:  reports that he has never smoked. He has never used smokeless tobacco. He reports that he does not drink alcohol or use illicit drugs.  History reviewed. No pertinent family history.  Review of Systems:  Unable to assess due to patient's mental status. He is extremely  uncomfortable in pain. He reports some lower abdominal pain. He cannot provide much further history.   Physical Exam: Blood pressure 128/85, pulse 76, temperature 98 F (36.7 C), temperature source Oral, SpO2 95.00%. General: Patient appears to be extremely uncomfortable lying in bed HEENT: Normocephalic, atraumatic, mucous membranes are dry Neck: Supple Chest: Clear to auscultation bilaterally Cardiac: S1, S2, regular rate and rhythm Abdomen: Soft, diffusely tender but more so in the right lower quadrant, bowel sounds active, PEG tube in the left upper quadrant. There is erythema around the insertion site. Extremities no edema, cyanosis Neuro: Unable to do proper neuro exam, response to simple questions  Labs on Admission:  Results for orders placed during the hospital encounter of 12/21/11 (from the past 48 hour(s))  URINALYSIS, ROUTINE W REFLEX MICROSCOPIC     Status: Abnormal   Collection Time   12/21/11  5:55 PM      Component Value Range Comment   Color, Urine YELLOW  YELLOW     APPearance CLOUDY (*) CLEAR     Specific Gravity, Urine 1.028  1.005 - 1.030     pH 7.5  5.0 - 8.0     Glucose, UA NEGATIVE  NEGATIVE (mg/dL)    Hgb urine dipstick NEGATIVE  NEGATIVE     Bilirubin Urine NEGATIVE  NEGATIVE     Ketones, ur TRACE (*) NEGATIVE (mg/dL)    Protein, ur NEGATIVE  NEGATIVE (mg/dL)    Urobilinogen, UA 0.2  0.0 - 1.0 (mg/dL)    Nitrite NEGATIVE  NEGATIVE     Leukocytes, UA MODERATE (*) NEGATIVE    URINE MICROSCOPIC-ADD ON     Status: Abnormal   Collection Time   12/21/11  5:55 PM      Component Value Range Comment   WBC, UA 11-20  <3 (WBC/hpf)    Bacteria, UA FEW (*) RARE     Urine-Other MUCOUS PRESENT     LACTIC ACID, PLASMA     Status: Normal   Collection Time   12/21/11  7:32 PM      Component Value Range Comment   Lactic Acid, Venous 1.1  0.5 - 2.2 (mmol/L)   CBC     Status: Normal   Collection Time   12/21/11  7:33 PM      Component Value Range Comment   WBC  8.5  4.0 - 10.5 (K/uL)    RBC 4.75  4.22 - 5.81 (MIL/uL)    Hemoglobin 13.3  13.0 - 17.0 (g/dL)    HCT 16.1  09.6 - 04.5 (%)    MCV 83.6  78.0 - 100.0 (fL)    MCH 28.0  26.0 - 34.0 (pg)    MCHC 33.5  30.0 - 36.0 (g/dL)    RDW 40.9  81.1 -  15.5 (%)    Platelets 192  150 - 400 (K/uL)   DIFFERENTIAL     Status: Normal   Collection Time   12/21/11  7:33 PM      Component Value Range Comment   Neutrophils Relative 62  43 - 77 (%)    Neutro Abs 5.2  1.7 - 7.7 (K/uL)    Lymphocytes Relative 23  12 - 46 (%)    Lymphs Abs 2.0  0.7 - 4.0 (K/uL)    Monocytes Relative 11  3 - 12 (%)    Monocytes Absolute 0.9  0.1 - 1.0 (K/uL)    Eosinophils Relative 4  0 - 5 (%)    Eosinophils Absolute 0.4  0.0 - 0.7 (K/uL)    Basophils Relative 0  0 - 1 (%)    Basophils Absolute 0.0  0.0 - 0.1 (K/uL)   COMPREHENSIVE METABOLIC PANEL     Status: Abnormal   Collection Time   12/21/11  7:33 PM      Component Value Range Comment   Sodium 139  135 - 145 (mEq/L)    Potassium 3.5  3.5 - 5.1 (mEq/L)    Chloride 105  96 - 112 (mEq/L)    CO2 21  19 - 32 (mEq/L)    Glucose, Bld 72  70 - 99 (mg/dL)    BUN 24 (*) 6 - 23 (mg/dL)    Creatinine, Ser 4.09  0.50 - 1.35 (mg/dL)    Calcium 9.8  8.4 - 10.5 (mg/dL)    Total Protein 7.8  6.0 - 8.3 (g/dL)    Albumin 3.6  3.5 - 5.2 (g/dL)    AST 15  0 - 37 (U/L)    ALT 11  0 - 53 (U/L)    Alkaline Phosphatase 73  39 - 117 (U/L)    Total Bilirubin 0.8  0.3 - 1.2 (mg/dL)    GFR calc non Af Amer >90  >90 (mL/min)    GFR calc Af Amer >90  >90 (mL/min)   LIPASE, BLOOD     Status: Normal   Collection Time   12/21/11  7:33 PM      Component Value Range Comment   Lipase 39  11 - 59 (U/L)     Radiological Exams on Admission: Ct Abdomen Pelvis Wo Contrast  12/21/2011  *RADIOLOGY REPORT*  Clinical Data: Unable to urinate, history of Addison's disease and seizures  CT ABDOMEN AND PELVIS WITHOUT CONTRAST  Technique:  Multidetector CT imaging of the abdomen and pelvis was  performed following the standard protocol without intravenous contrast.  Comparison: CT abdomen and pelvis of 10/05/2011.  Findings:   The lung bases are clear.  However, well seen on the lung window images there is a significant amount of free intraperitoneal air present there is a gastrostomy tube within the left upper quadrant which appears to be within the body of the stomach.  This intraperitoneal air could be due to the gastrostomy if there was recent placement.  However there is air scattered throughout the peritoneal cavity and perforated viscus cannot be excluded.  There are prominent soft tissues surrounding the entrance site of the gastrostomy tube which may indicate local cellulitis.  The liver is unremarkable.  Surgical clips are present from prior cholecystectomy.  The pancreas is normal in size and the pancreatic duct is not dilated.  The adrenal glands and spleen are unremarkable.  The right kidney has been surgically resected previously with multiple clips in the right retroperitoneum.  The left kidney is unremarkable in the unenhanced state with no calculi, mass, or hydronephrosis noted.  The abdominal aorta is normal in caliber.  There is some laxity of the right posterolateral abdominal wall probably due to prior surgery for right nephrectomy.  The urinary bladder is decompressed with Foley catheter.  There is an unusual linear air shadow within the rectum of questionable significance.  I discussed this finding with Dr. Dayton Bailiff in the [facility] emergency department at 7:00 p.m. on 12/21/2011.  A foreign body cannot be excluded.  The prostate is normal in size.  There is a 7 mm anterolisthesis of L5-1 as well with degenerative disc disease at L5-L1.  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 No acute bony abnormality is seen.  IMPRESSION:  1.  Free intraperitoneal air.  This may be due to the gastrostomy tube, but perforated bowel cannot be excluded. 2. Cannot exclude foreign body in the  rectum.  Correlate clinically. 3.  Anterolisthesis of L5 on S1 with bilateral pars defects.  Original Report Authenticated By: Juline Patch, M.D.   Dg Abd 1 View  12/21/2011  *RADIOLOGY REPORT*  Clinical Data: G tube placement confirmation  ABDOMEN - 1 VIEW  Comparison: CT 12/21/2011  Findings: Hand injection of small volume water-soluble contrast through the percutaneous gastrostomy tube layers dependently within the stomach.  IMPRESSION: Injection of small volume of contrast fills the stomach.  Original Report Authenticated By: Genevive Bi, M.D.   Dg Chest Port 1 View  12/21/2011  *RADIOLOGY REPORT*  Clinical Data: Evaluate PICC line placed  PORTABLE CHEST - 1 VIEW  Comparison: Portable chest x-ray of 11/18/2011  Findings: The tip of the left PICC line appears to be within the left subclavian vein, overlying the left medial axilla.  This could be advanced 10-12 cm to lie within the lower SVC.  No pneumothorax is seen.  Mediastinal contours are stable.  The heart is within normal limits in size.  No bony abnormality is noted.  IMPRESSION: Left PICC line tip seen to the left subclavian vein region.  This could be advanced 10-12 cm.  Original Report Authenticated By: Juline Patch, M.D.    Assessment/Plan Principal Problem:  *Pneumoperitoneum of unknown etiology Active Problems:  Anoxic brain injury  UTI (lower urinary tract infection)  Abdominal pain  Dehydration  PEG (percutaneous endoscopic gastrostomy) adjustment/replacement/removal  Plan:  Patient appears dehydrated and will be given IV fluids.  Regarding his pneumoperitoneum, it is unclear if it is at the exact cause of his abdominal pain. This may be secondary to leakage from his PEG tube although a perforated viscus cannot be ruled out. In any case patient appears to be very uncomfortable. He'll be admitted to a step down bed for closer monitoring, especially since he's unable to communicate and make his needs known. He'll be  given Primaxin for antibiotic coverage. This will give him good intra-abdominal coverage, as well as treat any urinary tract infection. We will check a urine culture although his urine just maybe chronically colonized. We will provide adequate pain control. We will keep him n.p.o. and not use his PEG tube until it can be assessed for any leakage. The surgical service will be following with [place].  Time Spent on Admission:  Runa Whittingham [facility] 12/21/2011, 10:59 PM

## 2011-12-21 NOTE — ED Notes (Signed)
Waiting on portable x ray results to draw blood and use PICC

## 2011-12-21 NOTE — ED Notes (Signed)
Pt transferred from ed to tcu 27, pt moaning in pain, md at bedside, 1mg  dilaudid given with some relief, status changed to stepdown, awaiting bed, vs stable,

## 2011-12-22 LAB — COMPREHENSIVE METABOLIC PANEL
ALT: 13 U/L (ref 0–53)
AST: 18 U/L (ref 0–37)
Alkaline Phosphatase: 96 U/L (ref 39–117)
CO2: 22 mEq/L (ref 19–32)
Calcium: 8.8 mg/dL (ref 8.4–10.5)
GFR calc Af Amer: >90 mL/min (ref >90)
GFR calc non Af Amer: >90 mL/min (ref >90)
Glucose, Bld: 71 mg/dL (ref 70–99)
Potassium: 3.8 mEq/L (ref 3.5–5.1)
Sodium: 142 mEq/L (ref 135–145)

## 2011-12-22 LAB — CBC
HCT: 38.2 % — ABNORMAL LOW (ref 39.0–52.0)
Hemoglobin: 12.9 g/dL — ABNORMAL LOW (ref 13.0–17.0)
MCH: 28.3 pg (ref 26.0–34.0)
MCHC: 33.8 g/dL (ref 30.0–36.0)
RDW: 14.1 % (ref 11.5–15.5)

## 2011-12-22 LAB — TSH: TSH: 0.081 u[IU]/mL — ABNORMAL LOW (ref 0.350–4.500)

## 2011-12-22 MED ORDER — SODIUM CHLORIDE 0.9 % IJ SOLN
10.0000 mL | INTRAMUSCULAR | Status: DC | PRN
  Administered 2011-12-22 – 2011-12-23 (×3): 10 mL

## 2011-12-22 MED ORDER — POTASSIUM CHLORIDE 2 MEQ/ML IV SOLN
INTRAVENOUS | Status: AC
  Administered 2011-12-22 (×2): via INTRAVENOUS
  Filled 2011-12-22 (×2): qty 1000

## 2011-12-22 MED ORDER — PANTOPRAZOLE SODIUM 40 MG IV SOLR
40.0000 mg | INTRAVENOUS | Status: DC
  Administered 2011-12-22 – 2011-12-23 (×2): 40 mg via INTRAVENOUS
  Filled 2011-12-22 (×3): qty 40

## 2011-12-22 MED ORDER — ACETAMINOPHEN 650 MG RE SUPP
650.0000 mg | Freq: Four times a day (QID) | RECTAL | Status: DC | PRN
  Filled 2011-12-22: qty 1

## 2011-12-22 MED ORDER — METOPROLOL TARTRATE 1 MG/ML IV SOLN
5.0000 mg | INTRAVENOUS | Status: DC | PRN
  Filled 2011-12-22: qty 5

## 2011-12-22 MED ORDER — ALBUTEROL SULFATE (5 MG/ML) 0.5% IN NEBU
2.5000 mg | INHALATION_SOLUTION | Freq: Four times a day (QID) | RESPIRATORY_TRACT | Status: DC | PRN
  Filled 2011-12-22: qty 0.5

## 2011-12-22 MED ORDER — SODIUM CHLORIDE 0.9 % IV SOLN
500.0000 mg | Freq: Four times a day (QID) | INTRAVENOUS | Status: DC
  Administered 2011-12-22 – 2011-12-23 (×6): 500 mg via INTRAVENOUS
  Filled 2011-12-22 (×10): qty 500

## 2011-12-22 MED ORDER — ALBUTEROL SULFATE (5 MG/ML) 0.5% IN NEBU: 2.5000 mg | INHALATION_SOLUTION | RESPIRATORY_TRACT | Status: DC | PRN

## 2011-12-22 MED ORDER — METHYLCELLULOSE 1 % OP SOLN: 1.0000 [drp] | Freq: Four times a day (QID) | OPHTHALMIC | Status: DC

## 2011-12-22 MED ORDER — ONDANSETRON HCL 4 MG/2ML IJ SOLN
4.0000 mg | Freq: Four times a day (QID) | INTRAMUSCULAR | Status: DC | PRN
  Filled 2011-12-22: qty 2

## 2011-12-22 MED ORDER — ONDANSETRON HCL 4 MG PO TABS
4.0000 mg | ORAL_TABLET | Freq: Four times a day (QID) | ORAL | Status: DC | PRN
  Filled 2011-12-22: qty 1

## 2011-12-22 MED ORDER — ACETAMINOPHEN 325 MG PO TABS
650.0000 mg | ORAL_TABLET | Freq: Four times a day (QID) | ORAL | Status: DC | PRN
  Administered 2011-12-23 – 2011-12-24 (×2): 650 mg via ORAL
  Filled 2011-12-22 (×3): qty 2

## 2011-12-22 MED ORDER — POLYVINYL ALCOHOL 1.4 % OP SOLN
1.0000 [drp] | Freq: Four times a day (QID) | OPHTHALMIC | Status: DC
  Administered 2011-12-22 – 2011-12-24 (×7): 1 [drp] via OPHTHALMIC
  Filled 2011-12-22: qty 15

## 2011-12-22 MED ORDER — SODIUM CHLORIDE 0.9 % IV SOLN
INTRAVENOUS | Status: DC
  Administered 2011-12-22: 07:00:00 via INTRAVENOUS

## 2011-12-22 NOTE — ED Notes (Signed)
Pt now resting comfortable, no longer moaning, will cont. To  monitor

## 2011-12-22 NOTE — Progress Notes (Addendum)
[name] CSN:[id number] is a 45 y.o. male,  Outpatient Primary MD for the patient is [name], MD, MD  Chief Complaint  Patient presents with  . Urinary Retention  . Abdominal Pain        Subjective:   [name] today has, No headache, No chest pain, No abdominal pain - No Nausea, No new weakness tingling or numbness, No Cough - SOB.(nods no to all)  Objective:   Filed Vitals:   12/21/11 1722 12/21/11 2224 12/21/11 2244 12/22/11 0340  BP:  128/85  101/57  Pulse:  76  107  Temp:  98 F (36.7 C)    TempSrc:  Oral    Resp:    16  SpO2: 98% 100% 95% 96%    Wt Readings from Last 3 Encounters:  11/25/11 104.5 kg (230 lb 6.1 oz)  11/20/11 104.5 kg (230 lb 6.1 oz)    No intake or output data in the 24 hours ending 12/22/11 0905  Exam Awake Alert , No new F.N deficits, Normal affect [place].AT,PERRAL Supple Neck,No JVD, No cervical lymphadenopathy appriciated.  Symmetrical Chest wall movement, Good air movement bilaterally, CTAB RRR,No Gallops,Rubs or new Murmurs, No Parasternal Heave +ve B.Sounds, Abd Soft, Non tender, No organomegaly appriciated, No rebound -guarding or rigidity.PEG site stable. No Cyanosis, Clubbing or edema, No new Rash or bruise    Data Review  CBC  Lab 12/22/11 0548 12/21/11 1933  WBC 6.2 8.5  HGB 12.9* 13.3  HCT 38.2* 39.7  PLT 179 192  MCV 83.8 83.6  MCH 28.3 28.0  MCHC 33.8 33.5  RDW 14.1 13.7  LYMPHSABS -- 2.0  MONOABS -- 0.9  EOSABS -- 0.4  BASOSABS -- 0.0  BANDABS -- --    Chemistries   Lab 12/22/11 0548 12/21/11 1933  NA 142 139  K 3.8 3.5  CL 112 105  CO2 22 21  GLUCOSE 71 72  BUN 22 24*  CREATININE 0.83 0.96  CALCIUM 8.8 9.8  MG -- --  AST 18 15  ALT 13 11  ALKPHOS 96 73  BILITOT 0.4 0.8   ------------------------------------------------------------------------------------------------------------------ CrCl is unknown because both a height and weight (above a minimum accepted value) are  required for this calculation. ------------------------------------------------------------------------------------------------------------------ No results found for this basename: HGBA1C:2 in the last 72 hours ------------------------------------------------------------------------------------------------------------------ No results found for this basename: CHOL:2,HDL:2,LDLCALC:2,TRIG:2,CHOLHDL:2,LDLDIRECT:2 in the last 72 hours ------------------------------------------------------------------------------------------------------------------ No results found for this basename: TSH,T4TOTAL,FREET3,T3FREE,THYROIDAB in the last 72 hours ------------------------------------------------------------------------------------------------------------------ No results found for this basename: VITAMINB12:2,FOLATE:2,FERRITIN:2,TIBC:2,IRON:2,RETICCTPCT:2 in the last 72 hours  Coagulation profile No results found for this basename: INR:5,PROTIME:5 in the last 168 hours  No results found for this basename: DDIMER:2 in the last 72 hours  Cardiac Enzymes No results found for this basename: CK:3,CKMB:3,TROPONINI:3,MYOGLOBIN:3 in the last 168 hours ------------------------------------------------------------------------------------------------------------------ No components found with this basename: POCBNP:3  Micro Results No results found for this or any previous visit (from the past 240 hour(s)).  Radiology Reports Ct Abdomen Pelvis Wo Contrast  12/21/2011  *RADIOLOGY REPORT*  Clinical Data: Unable to urinate, history of Addison's disease and seizures  CT ABDOMEN AND PELVIS WITHOUT CONTRAST  Technique:  Multidetector CT imaging of the abdomen and pelvis was performed following the standard protocol without intravenous contrast.  Comparison: CT abdomen and pelvis of 10/05/2011.  Findings:   The lung bases are clear.  However, well seen on the lung window images there is a significant amount of free  intraperitoneal air present there is a gastrostomy tube within the left upper  quadrant which appears to be within the body of the stomach.  This intraperitoneal air could be due to the gastrostomy if there was recent placement.  However there is air scattered throughout the peritoneal cavity and perforated viscus cannot be excluded.  There are prominent soft tissues surrounding the entrance site of the gastrostomy tube which may indicate local cellulitis.  The liver is unremarkable.  Surgical clips are present from prior cholecystectomy.  The pancreas is normal in size and the pancreatic duct is not dilated.  The adrenal glands and spleen are unremarkable.  The right kidney has been surgically resected previously with multiple clips in the right retroperitoneum.  The left kidney is unremarkable in the unenhanced state with no calculi, mass, or hydronephrosis noted.  The abdominal aorta is normal in caliber.  There is some laxity of the right posterolateral abdominal wall probably due to prior surgery for right nephrectomy.  The urinary bladder is decompressed with Foley catheter.  There is an unusual linear air shadow within the rectum of questionable significance.  I discussed this finding with Dr. Dayton Bailiff in the [facility] emergency department at 7:00 p.m. on 12/21/2011.  A foreign body cannot be excluded.  The prostate is normal in size.  There is a 7 mm anterolisthesis of L5-1 as well with degenerative disc disease at L5-L1.  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 No acute bony abnormality is seen.    IMPRESSION:  1.  Free intraperitoneal air.  This may be due to the gastrostomy tube, but perforated bowel cannot be excluded. 2. Cannot exclude foreign body in the rectum.  Correlate clinically. 3.  Anterolisthesis of L5 on S1 with bilateral pars defects.  Original Report Authenticated By: Juline Patch, M.D.   Dg Abd 1 View  12/21/2011  *RADIOLOGY REPORT*  Clinical Data: G tube placement confirmation   ABDOMEN - 1 VIEW  Comparison: CT 12/21/2011  Findings: Hand injection of small volume water-soluble contrast through the percutaneous gastrostomy tube layers dependently within the stomach.    IMPRESSION: Injection of small volume of contrast fills the stomach.  Original Report Authenticated By: Genevive Bi, M.D.     Ir Gastrostomy Tube 11/26/2011      Dg Chest Port 1 View  12/21/2011  *RADIOLOGY REPORT*  Clinical Data: Evaluate PICC line placed  PORTABLE CHEST - 1 VIEW  Comparison: Portable chest x-ray of 11/18/2011  Findings: The tip of the left PICC line appears to be within the left subclavian vein, overlying the left medial axilla.  This could be advanced 10-12 cm to lie within the lower SVC.  No pneumothorax is seen.  Mediastinal contours are stable.  The heart is within normal limits in size.  No bony abnormality is noted.  IMPRESSION: Left PICC line tip seen to the left subclavian vein region.  This could be advanced 10-12 cm.  Original Report Authenticated By: Juline Patch, M.D.     Scheduled Meds:   . sodium chloride   Intravenous STAT  . ertapenem  1 g Intravenous Once  . fentaNYL  50 mcg Intravenous Once  . fentaNYL  50 mcg Intravenous Once  .  HYDROmorphone (DILAUDID) injection  1 mg Intravenous Once  . imipenem-cilastatin  500 mg Intravenous Q6H  . pantoprazole (PROTONIX) IV  40 mg Intravenous Q24H  . polyvinyl alcohol  1 drop Both Eyes QID  . sodium chloride  1,000 mL Intravenous Once  . sodium chloride  1,000 mL Intravenous Once  . DISCONTD: cefTRIAXone (  ROCEPHIN)  IV  1 g Intravenous Once  . DISCONTD: methylcellulose  1 drop Both Eyes QID   Continuous Infusions:   . sodium chloride 125 mL/hr at 12/22/11 0720   PRN Meds:.acetaminophen, acetaminophen, albuterol, HYDROmorphone, metoprolol, ondansetron (ZOFRAN) IV, ondansetron (ZOFRAN) IV, ondansetron  Assessment & Plan   #1. Abdominal pain with recent PEG tube placement by IR few weeks ago, with questionable  Pneumoperitoneum of unknown etiology - patient currently appears to be pain-free he is resting in the ER comfortably, he is able to answer my questions with not on his head, his abdominal exam appears to be stable, I seriously think that the free air noted on CT scan was post procedure findings from the PEG tube placement. His abdominal pain could have been due to his UTI for which he is already getting antibiotics. Surgery is following the patient. I have also placed a call for IR to evaluate the patient for questionable PEG site leak. Patient currently on IV fluids and antibiotics and do feeds are held until instructed by general surgery.  Addeundum note 3pm - I Had D/W IR Dr Kerry Kass  At [facility], he had said per his image review PEG looked stable, later Surgery saw pt at 12pm and recommends re eval by IR, callled IR Dept 2pm was forwrded to Dr Candyce Churn who informed me he was on call only for Neuro, called IR again, no other MD avialable  per them, have informed RN at IR brandy to have pt seen ASAP in am for PEG tube use and ? Leak.  At 6pm 12-22-11 - called back by Dr Gloriann Loan, he will get pt seen 12-23-11.   #2. H/O Anoxic brain injury - no acute issues I have seen this patient before his mental status appears to be close to his baseline.  #3. UTI. Follow cultures, continue IV fluids and IV antibiotics for now.  #4. History of atrial fibrillation currently no acute issues. Since patient is n.p.o. he will be on a telemetry bed with when necessary IV Lopressor.  #5. Of note patient is on Tapazole 5 mg daily, Celexa 20 mg daily these medications should be resumed as soon as patient is cleared for feeding via PEG tube. I will check a baseline TSH free T3 and free T4.   DVT Prophylaxis   SCDs  See all Orders from today for further details     Leroy Sea M.D on 12/22/2011 at 9:05 AM  [facility] Office  [phone]

## 2011-12-22 NOTE — Progress Notes (Signed)
Patient ID: [name], male   DOB: 26-Jun-1966, 45 y.o.   MRN: [id number]  General Surgery - [facility] - Progress Note  HD# 2  Subjective: Patient appears comfortable on ward 4 East.  Reportedly BM this AM in ER before transfer to floor.  Objective: Vital signs in last 24 hours: Temp:  [97.9 F (36.6 C)-98.4 F (36.9 C)] 98.4 F (36.9 C) (12/25 1025) Pulse Rate:  [76-107] 80  (12/25 1025) Resp:  [14-16] 14  (12/25 1025) BP: (101-128)/(57-85) 111/80 mmHg (12/25 1025) SpO2:  [95 %-100 %] 97 % (12/25 1025) Weight:  [212 lb 1.3 oz (96.2 kg)] 212 lb 1.3 oz (96.2 kg) (12/25 1025) Last BM Date: 12/22/11  Intake/Output from previous day:    Exam: HEENT - clear, not icteric Neck - no mass; healed surgical wound Chest - clear bilaterally Cor - RRR, no murmur Abd - soft without distension; non-tender; G-tube LUQ Ext - no significant edema Neuro - grossly intact, no focal deficits  Lab Results:   [facility] 12/22/11 0548 12/21/11 1933  WBC 6.2 8.5  HGB 12.9* 13.3  HCT 38.2* 39.7  PLT 179 192     Basename 12/22/11 0548 12/21/11 1933  NA 142 139  K 3.8 3.5  CL 112 105  CO2 22 21  GLUCOSE 71 72  BUN 22 24*  CREATININE 0.83 0.96  CALCIUM 8.8 9.8    Studies/Results: Ct Abdomen Pelvis Wo Contrast  12/21/2011  *RADIOLOGY REPORT*  Clinical Data: Unable to urinate, history of Addison's disease and seizures  CT ABDOMEN AND PELVIS WITHOUT CONTRAST  Technique:  Multidetector CT imaging of the abdomen and pelvis was performed following the standard protocol without intravenous contrast.  Comparison: CT abdomen and pelvis of 10/05/2011.  Findings:   The lung bases are clear.  However, well seen on the lung window images there is a significant amount of free intraperitoneal air present there is a gastrostomy tube within the left upper quadrant which appears to be within the body of the stomach.  This intraperitoneal air could be due to the gastrostomy if there was  recent placement.  However there is air scattered throughout the peritoneal cavity and perforated viscus cannot be excluded.  There are prominent soft tissues surrounding the entrance site of the gastrostomy tube which may indicate local cellulitis.  The liver is unremarkable.  Surgical clips are present from prior cholecystectomy.  The pancreas is normal in size and the pancreatic duct is not dilated.  The adrenal glands and spleen are unremarkable.  The right kidney has been surgically resected previously with multiple clips in the right retroperitoneum.  The left kidney is unremarkable in the unenhanced state with no calculi, mass, or hydronephrosis noted.  The abdominal aorta is normal in caliber.  There is some laxity of the right posterolateral abdominal wall probably due to prior surgery for right nephrectomy.  The urinary bladder is decompressed with Foley catheter.  There is an unusual linear air shadow within the rectum of questionable significance.  I discussed this finding with Dr. Dayton Bailiff in the [facility] emergency department at 7:00 p.m. on 12/21/2011.  A foreign body cannot be excluded.  The prostate is normal in size.  There is a 7 mm anterolisthesis of L5-1 as well with degenerative disc disease at L5-L1.  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 No acute bony abnormality is seen.  IMPRESSION:  1.  Free intraperitoneal air.  This may be due to the gastrostomy tube, but  perforated bowel cannot be excluded. 2. Cannot exclude foreign body in the rectum.  Correlate clinically. 3.  Anterolisthesis of L5 on S1 with bilateral pars defects.  Original Report Authenticated By: Juline Patch, M.D.   Dg Abd 1 View  12/21/2011  *RADIOLOGY REPORT*  Clinical Data: G tube placement confirmation  ABDOMEN - 1 VIEW  Comparison: CT 12/21/2011  Findings: Hand injection of small volume water-soluble contrast through the percutaneous gastrostomy tube layers dependently within the stomach.  IMPRESSION: Injection  of small volume of contrast fills the stomach.  Original Report Authenticated By: Genevive Bi, M.D.   Dg Chest Port 1 View  12/21/2011  *RADIOLOGY REPORT*  Clinical Data: Evaluate PICC line placed  PORTABLE CHEST - 1 VIEW  Comparison: Portable chest x-ray of 11/18/2011  Findings: The tip of the left PICC line appears to be within the left subclavian vein, overlying the left medial axilla.  This could be advanced 10-12 cm to lie within the lower SVC.  No pneumothorax is seen.  Mediastinal contours are stable.  The heart is within normal limits in size.  No bony abnormality is noted.  IMPRESSION: Left PICC line tip seen to the left subclavian vein region.  This could be advanced 10-12 cm.  Original Report Authenticated By: Juline Patch, M.D.    Assessment: Abdominal pain - clinically improved Pneumoperitoneum - ? Residual from PEG placement  Plan: Appreciate medical team management No role for acute surgical intervention Would request IR to re-evaluate PEG tube - ? UGI series, repeat CT Abd Will follow with you Agree with empiric Primaxin Rx   Velora Heckler, MD, FACS General & Endocrine Surgery [facility]  12/22/2011

## 2011-12-22 NOTE — Progress Notes (Signed)
Patient is resting well at this time.  Does not complain of pain or discomfort.  Patient has no other skin issues but a left inner ankle stage II pressure ulcer.  Allevyn placed on ankle.  Will continue to monitor patient.

## 2011-12-22 NOTE — Progress Notes (Signed)
ANTIBIOTIC CONSULT NOTE - INITIAL  Pharmacy Consult for Primaxin Indication: Abdominal pain/UTI  Allergies  Allergen Reactions  . Latex Dermatitis  . Morphine And Related Other (See Comments)    UNKNOWN  . Penicillins Other (See Comments)    UNKNOWN  . Pyridium (Phenazopyridine Hcl) Other (See Comments)    UNKNOWN  . Soy Allergy Other (See Comments)    UNKNOWN    Patient Measurements:   Ht = 175 cm Wt:  104 kg  Vital Signs: Temp: 98 F (36.7 C) (12/24 2224) Temp src: Oral (12/24 2224) BP: 128/85 mmHg (12/24 2224) Pulse Rate: 76  (12/24 2224) Intake/Output from previous day:   Intake/Output from this shift:    Labs:  [facility] 12/21/11 1933  WBC 8.5  HGB 13.3  PLT 192  LABCREA --  CREATININE 0.96   The CrCl is unknown because both a height and weight (above a minimum accepted value) are required for this calculation. No results found for this basename: VANCOTROUGH:2,VANCOPEAK:2,VANCORANDOM:2,GENTTROUGH:2,GENTPEAK:2,GENTRANDOM:2,TOBRATROUGH:2,TOBRAPEAK:2,TOBRARND:2,AMIKACINPEAK:2,AMIKACINTROU:2,AMIKACIN:2, in the last 72 hours   Microbiology: Recent Results (from the past 720 hour(s))  CULTURE, BLOOD (ROUTINE X 2)     Status: Normal   Collection Time   11/23/11 12:40 PM      Component Value Range Status Comment   Specimen Description BLOOD RIGHT ARM   Final    Special Requests BOTTLES DRAWN AEROBIC ONLY 2CC   Final    Setup Time [id number]   Final    Culture NO GROWTH 5 DAYS   Final    Report Status 11/29/2011 FINAL   Final   CULTURE, BLOOD (ROUTINE X 2)     Status: Normal   Collection Time   11/23/11 12:44 PM      Component Value Range Status Comment   Specimen Description BLOOD LEFT PICC   Final    Special Requests BOTTLES DRAWN AEROBIC AND ANAEROBIC [facility]   Final    Setup Time [id number]   Final    Culture NO GROWTH 5 DAYS   Final    Report Status 11/29/2011 FINAL   Final   URINE CULTURE     Status: Normal   Collection Time   11/23/11  1:09 PM    Component Value Range Status Comment   Specimen Description URINE, CATHETERIZED   Final    Special Requests Normal   Final    Setup Time [id number]   Final    Colony Count NO GROWTH   Final    Culture NO GROWTH   Final    Report Status 11/25/2011 FINAL   Final   MRSA PCR SCREENING     Status: Normal   Collection Time   11/24/11  6:57 PM      Component Value Range Status Comment   MRSA by PCR NEGATIVE  NEGATIVE  Final     Medical History: Past Medical History  Diagnosis Date  . Addison disease   . Anoxic brain injury   . A-fib   . Seizure disorder   . Dysphagia   . History of recurrent UTIs   . Hyperlipemia   . Aspiration pneumonia   . Mental disorder   . Anxiety   . Reflux   . Dysphagia   . Glucocorticoid deficiency   . Atrial fibrillation   . Glaucoma   . Hypothyroidism   . Encephalopathy   . Quadriplegia   . Addison disease   . Myocardial infarction     Medications:  Scheduled:    . sodium chloride   Intravenous  STAT  . ertapenem  1 g Intravenous Once  . fentaNYL  50 mcg Intravenous Once  . fentaNYL  50 mcg Intravenous Once  .  HYDROmorphone (DILAUDID) injection  1 mg Intravenous Once  . sodium chloride  1,000 mL Intravenous Once  . sodium chloride  1,000 mL Intravenous Once  . DISCONTD: cefTRIAXone (ROCEPHIN)  IV  1 g Intravenous Once   Infusions:   Assessment: 45 yo male resident of  [facility]- c/o abdominal pain.  Patient recently had PEG tube placement.    Goal of Therapy:  Treat infection (Cover UTI and intra- abdomnial)  Plan:  1.  Primaxin 500 mg IV q6h. 2.  CrCl ~ 100 ml/min (N) 3,  Patient with history of seizure disorder, but tolerated Primaxin in past.  Susanne Greenhouse R 12/22/2011,1:06 AM

## 2011-12-23 LAB — URINE CULTURE
Colony Count: NO GROWTH
Culture  Setup Time: 201212250130
Culture: NO GROWTH

## 2011-12-23 MED ORDER — CHLORHEXIDINE GLUCONATE CLOTH 2 % EX PADS
6.0000 | MEDICATED_PAD | Freq: Every day | CUTANEOUS | Status: DC
  Administered 2011-12-24: 6 via TOPICAL

## 2011-12-23 MED ORDER — MULTI-DELYN PO LIQD
5.0000 mL | Freq: Every day | ORAL | Status: DC
  Administered 2011-12-23 – 2011-12-24 (×2): 5 mL
  Filled 2011-12-23 (×3): qty 5

## 2011-12-23 MED ORDER — CITALOPRAM HYDROBROMIDE 20 MG PO TABS
20.0000 mg | ORAL_TABLET | Freq: Every day | ORAL | Status: DC
  Administered 2011-12-23 – 2011-12-24 (×2): 20 mg via ORAL
  Filled 2011-12-23 (×3): qty 1

## 2011-12-23 MED ORDER — MINERAL OIL RE ENEM: 1.0000 | ENEMA | RECTAL | Status: DC | PRN

## 2011-12-23 MED ORDER — FERROUS SULFATE 325 (65 FE) MG PO TABS
325.0000 mg | ORAL_TABLET | Freq: Three times a day (TID) | ORAL | Status: DC
  Administered 2011-12-23 – 2011-12-24 (×3): 325 mg via ORAL
  Filled 2011-12-23 (×7): qty 1

## 2011-12-23 MED ORDER — METOPROLOL TARTRATE 25 MG/10 ML ORAL SUSPENSION: 12.5000 mg | Freq: Two times a day (BID) | ORAL | Status: DC

## 2011-12-23 MED ORDER — VITAL 1.5 CAL PO LIQD
1000.0000 mL | ORAL | Status: DC
  Administered 2011-12-23 – 2011-12-24 (×2): 1000 mL
  Filled 2011-12-23 (×3): qty 1000

## 2011-12-23 MED ORDER — METOPROLOL TARTRATE 25 MG/10 ML ORAL SUSPENSION
12.5000 mg | Freq: Two times a day (BID) | ORAL | Status: DC
  Administered 2011-12-23: 12.5 mg via ORAL
  Filled 2011-12-23 (×5): qty 5

## 2011-12-23 MED ORDER — PANTOPRAZOLE SODIUM 40 MG PO PACK
40.0000 mg | PACK | Freq: Every day | ORAL | Status: DC
  Administered 2011-12-23 – 2011-12-24 (×2): 40 mg
  Filled 2011-12-23 (×4): qty 20

## 2011-12-23 MED ORDER — PRO-STAT SUGAR FREE PO LIQD
30.0000 mL | Freq: Two times a day (BID) | ORAL | Status: DC
  Administered 2011-12-23 – 2011-12-24 (×3): 30 mL via ORAL
  Filled 2011-12-23 (×6): qty 30

## 2011-12-23 MED ORDER — POLYETHYLENE GLYCOL 3350 17 G PO PACK
17.0000 g | PACK | Freq: Two times a day (BID) | ORAL | Status: DC
  Administered 2011-12-23 – 2011-12-24 (×3): 17 g via ORAL
  Filled 2011-12-23 (×6): qty 1

## 2011-12-23 MED ORDER — METHIMAZOLE 5 MG PO TABS
5.0000 mg | ORAL_TABLET | Freq: Every day | ORAL | Status: DC
  Administered 2011-12-23 – 2011-12-24 (×2): 5 mg via ORAL
  Filled 2011-12-23 (×3): qty 1

## 2011-12-23 MED ORDER — MUPIROCIN 2 % EX OINT
1.0000 "application " | TOPICAL_OINTMENT | Freq: Two times a day (BID) | CUTANEOUS | Status: DC
  Administered 2011-12-24 (×2): 1 via NASAL
  Filled 2011-12-23: qty 22

## 2011-12-23 NOTE — Progress Notes (Signed)
Pt has order to dc fluids, med, and to d/c picc line; have spoken with Dr Rito Ehrlich; RN prefers some type of line be kept, as pt is on telemetry;  picc has been flushed w. Ns; RN to talk w. MD when he makes rounds again this afternoon;  Will follow-up with RN later this afternoon;  Thank you.

## 2011-12-23 NOTE — Progress Notes (Signed)
Patient ID: [name], male   DOB: [birth year], 45 y.o.   MRN: [id number]  Nonverbal AFVSS Abd - S/NT. Gtube is in place without signs of infection. Flange is above skin. CT - positive for free IP air but no abscess. Gtube is in place.  A/P Gtube functioning well without complication. Free air likely from loose flange. Flange was tightened. Gtube is ready and OK for use.

## 2011-12-23 NOTE — Progress Notes (Signed)
Patient ID: [name], male   DOB: 26-May-1966, 45 y.o.   MRN: [id number]    Subjective: No apparent issues - brain injury limits communications  Objective: Vital signs in last 24 hours: Temp:  [98.4 F (36.9 C)-99 F (37.2 C)] 98.4 F (36.9 C) (12/26 0621) Pulse Rate:  [67-74] 73  (12/26 0621) Resp:  [16-17] 17  (12/26 0621) BP: (106-118)/(70-81) 118/80 mmHg (12/26 0621) SpO2:  [96 %-98 %] 96 % (12/26 0621) Weight:  [218 lb 14.7 oz (99.3 kg)] 218 lb 14.7 oz (99.3 kg) (12/26 0621) Last BM Date: 12/22/11  Intake/Output from previous day: 12/25 0701 - 12/26 0700 In: 1790 [I.V.:1290; IV Piggyback:500] Out: 1025 [Urine:1025] Intake/Output this shift:    Abdomen is soft, non tender no guarding or rebound. G-Tube looks OK. No surgical findings  Lab Results:  Results for orders placed during the hospital encounter of 12/21/11 (from the past 24 hour(s))  MRSA PCR SCREENING     Status: Abnormal   Collection Time   12/22/11 11:48 AM      Component Value Range   MRSA by PCR POSITIVE (*) NEGATIVE      Studies/Results: @RISRSLT24 @     . imipenem-cilastatin  500 mg Intravenous Q6H  . pantoprazole (PROTONIX) IV  40 mg Intravenous Q24H  . polyvinyl alcohol  1 drop Both Eyes QID  . DISCONTD:  HYDROmorphone (DILAUDID) injection  1 mg Intravenous Once     Assessment/Plan: s/p  Gastrostomy by IR several weeks ago No evidence of peritonitis  Recommend: F/U by IR. No surgical indication. Will see again PRN    LOS: 2 days    Takeyah Wieman J 12/23/2011

## 2011-12-23 NOTE — Progress Notes (Signed)
[name] CSN:[id number],MRN:[id number] is a 45 y.o. male,  Outpatient Primary MD for the patient is [name], MD, MD  Chief Complaint  Patient presents with  . Urinary Retention  . Abdominal Pain        Subjective:   [name] is doing okay and has no complaints. He answers appropriately by shaking his head or nodding his head and denies any complaints  Objective:   Filed Vitals:   12/22/11 1025 12/22/11 1450 12/22/11 2150 12/23/11 0621  BP: 111/80 117/81 106/70 118/80  Pulse: 80 74 67 73  Temp: 98.4 F (36.9 C) 99 F (37.2 C) 98.4 F (36.9 C) 98.4 F (36.9 C)  TempSrc: Oral Oral Oral Oral  Resp: 14 16 17 17   Height: 5\' 9"  (1.753 m)     Weight: 96.2 kg (212 lb 1.3 oz)   99.3 kg (218 lb 14.7 oz)  SpO2: 97% 96% 98% 96%    Wt Readings from Last 3 Encounters:  12/23/11 99.3 kg (218 lb 14.7 oz)  11/25/11 104.5 kg (230 lb 6.1 oz)  11/20/11 104.5 kg (230 lb 6.1 oz)     Intake/Output Summary (Last 24 hours) at 12/23/11 1328 Last data filed at 12/23/11 0759  Gross per 24 hour  Intake   1790 ml  Output   1025 ml  Net    765 ml    Exam Awake Alert , No new F.N deficits, Normal affect [place].AT,PERRAL Supple Neck,No JVD, No cervical lymphadenopathy appriciated.  Symmetrical Chest wall movement, Good air movement bilaterally, CTAB RRR,No Gallops,Rubs or new Murmurs, No Parasternal Heave +ve B.Sounds, Abd Soft, Non tender, No organomegaly appriciated, No rebound -guarding or rigidity.PEG site stable. No Cyanosis, Clubbing or edema, No new Rash or bruise    Data Review  CBC  Lab 12/22/11 0548 12/21/11 1933  WBC 6.2 8.5  HGB 12.9* 13.3  HCT 38.2* 39.7  PLT 179 192  MCV 83.8 83.6  MCH 28.3 28.0  MCHC 33.8 33.5  RDW 14.1 13.7  LYMPHSABS -- 2.0  MONOABS -- 0.9  EOSABS -- 0.4  BASOSABS -- 0.0  BANDABS -- --    Chemistries   Lab 12/22/11 0548 12/21/11 1933  NA 142 139  K 3.8 3.5  CL 112 105  CO2 22 21  GLUCOSE 71 72  BUN 22 24*  CREATININE  0.83 0.96  CALCIUM 8.8 9.8  MG -- --  AST 18 15  ALT 13 11  ALKPHOS 96 73  BILITOT 0.4 0.8   -  Basename 12/22/11 0518  TSH 0.081*  T4TOTAL --  T3FREE 3.1  THYROIDAB --    Micro Results Recent Results (from the past 240 hour(s))  MRSA PCR SCREENING     Status: Abnormal   Collection Time   12/22/11 11:48 AM      Component Value Range Status Comment   MRSA by PCR POSITIVE (*) NEGATIVE  Final       Scheduled Meds:    . citalopram  20 mg Oral Daily  . feeding supplement  30 mL Oral BID  . ferrous sulfate  325 mg Oral TID WC  . methimazole  5 mg Oral Daily  . metoprolol tartrate  12.5 mg Oral BID  . multivitamin  5 mL Per Tube Daily  . pantoprazole sodium  40 mg Per Tube Q1200  . polyethylene glycol  17 g Oral BID  . polyvinyl alcohol  1 drop Both Eyes QID  . DISCONTD: imipenem-cilastatin  500 mg Intravenous Q6H  .  DISCONTD: pantoprazole (PROTONIX) IV  40 mg Intravenous Q24H   Continuous Infusions:    . dextrose 5 %-0.45% nacl with kcl 75 mL/hr at 12/22/11 2142  . feeding supplement (VITAL 1.5 CAL)     PRN Meds:.acetaminophen, acetaminophen, albuterol, mineral oil, ondansetron, sodium chloride, DISCONTD: HYDROmorphone, DISCONTD: metoprolol, DISCONTD: ondansetron (ZOFRAN) IV, DISCONTD: ondansetron (ZOFRAN) IV  Assessment & Plan   #1. Abdominal pain with recent PEG tube placement by IR few weeks ago, with questionable Pneumoperitoneum of unknown etiology - patient currently appears to be pain-free . Cleared by general surgery. Evaluated by IR who felt that there was likely from loose flange. which has been retightened. Restart oral meds and tube feeds.  #2. H/O Anoxic brain injury - mental status appears to be close to his baseline.  #3. UTI. Received several days of IV antibiotics, now discontinued.  #4. Lopressor changed back to by mouth  Hyperthyroidism: Restarted back on Tapazole.  Depression-restarted back on Celexa.  DVT Prophylaxis   SCDs  See all  Orders from today for further details   Spoke with his mother on 12/26 and likely back to facility tomorrow.  Hollice Espy M.D on 12/23/2011 at 1:28 PM  [facility] Office  [phone]

## 2011-12-23 NOTE — Progress Notes (Signed)
Pt has a dual lumen picc line in his left upper arm, placed on Nov 17 by Radiology;  Recent cxr report shows picc tip in the left subclavian vein,  Overlying the left medial axilla;  Tip should be in the svc; please advise regarding replacing this line;  Thank you;

## 2011-12-24 NOTE — Discharge Summary (Signed)
DISCHARGE SUMMARY  [name]  MR#: [id number]  DOB:Jul 24, 1966  Date of Admission: 12/21/2011 Date of Discharge: 12/24/2011  Attending Physician:KRISHNAN,SENDIL K  Patient's NWG:[id number] Steven Shropshire, MD, MD  Consults: Art Hoss, Int Radiology Darnell Level, Gen Surgery  Discharge Diagnoses: Present on Admission:  .Abdominal pain .Dehydration .Pneumoperitoneum of unknown etiology .Anoxic brain injury .UTI (lower urinary tract infection) Seizure disorder  Hyperlipemia  Anxiety  Reflux  Dysphagia  Atrial fibrillation  Glaucoma  Hypothyroidism  Quadriplegia  Addison disease   Current Discharge Medication List    CONTINUE these medications which have NOT CHANGED   Details  citalopram (CELEXA) 20 MG tablet Take 20 mg by mouth daily.      feeding supplement (PRO-STAT SUGAR FREE 64) LIQD Take 30 mLs by mouth 2 (two) times daily.      ferrous sulfate 325 (65 FE) MG tablet Take 325 mg by mouth 3 (three) times daily with meals.      fish oil-omega-3 fatty acids 1000 MG capsule Take 1 g by mouth 2 (two) times daily.      ipratropium-albuterol (DUONEB) 0.5-2.5 (3) MG/3ML SOLN Take 3 mLs by nebulization every 6 (six) hours as needed. SHORTNESS OF BREATH     methimazole (TAPAZOLE) 5 MG tablet Take 5 mg by mouth daily.      methylcellulose (ARTIFICIAL TEARS) 1 % ophthalmic solution Place 1 drop into both eyes 4 (four) times daily.      metoprolol tartrate (LOPRESSOR) 25 mg/10 mL SUSP Take 5 mLs (12.5 mg total) by mouth 2 (two) times daily. Qty: 300 mL, Refills: 0    mineral oil enema Place 1 enema rectally as needed. FOR CONSTIPATION     Nutritional Supplements (FEEDING SUPPLEMENT, VITAL 1.5 CAL,) LIQD Place 1,000 mLs into feeding tube continuous. Qty: 30 Can, Refills: 0    pantoprazole sodium (PROTONIX) 40 mg/20 mL PACK Place 20 mLs (40 mg total) into feeding tube daily at 12 noon. Qty: 30 each, Refills: 0    Pediatric Multiple Vitamins (MULTIVITAMIN) LIQD Place 5 mLs  into feeding tube daily. Qty: 1 Bottle, Refills: 0    polyethylene glycol (MIRALAX / GLYCOLAX) packet Take 17 g by mouth 2 (two) times daily.          Hospital Course: Present on Admission:  Abdominal pain .Dehydration .Pneumoperitoneum of unknown etiology-principle issue.  Pt had a recent PEG placed and was brought in with complaints of abd pain.  In exam in the ER, air was seen in the diaphragm and there was concern for a leak in his PEG tube.  Pt's meds and tube feedings were held, and surgery was consulted.  There were no indications for a perforated bowel and surgery recommended a followup eval by IR who placed the tube originally.  IR saw the pt and felt the air seen was due to a loose phlange which adhered the tube.  Once this was tightened, he was cleared to re-use the tube.  Meds and tube feeds were restarted on 12/26 which the patient tolerated.  He is felt to be medically stable to return back his SNF.  His mother, who is POA, was kept informed during his hospital stay.  .Anoxic brain injury  .UTI (lower urinary tract infection)-received 3 days of abx IV.  Seizure disorder-no seizures during hospitalization, cont home meds/doses.  Hyperlipemia   Anxiety-no acute events.  Reflux-On PPI  Dysphagia-PEG in place and tolerating TF's.  Atrial fibrillation-heart rate stable, he is not on coumadin.    Glaucoma-cont on eye drops.  Hypothyroidism-restarted back on synthroid.  Quadriplegia   Addison disease-BP was stable during this admit as was electrolytes.   Day of Discharge BP 96/86  Pulse 86  Temp(Src) 97.6 F (36.4 C) (Oral)  Resp 18  Ht 5\' 9"  (1.753 m)  Wt 99.3 kg (218 lb 14.7 oz)  BMI 32.33 kg/m2  SpO2 86%  Physical Exam: Gen: Awake, alert, NAD, looks to be at baseline HEENT: [place], AT, MM are moist CV:RRR, S1 S2 Lungs: CTAB Abd: Soft, NT, ND, Peg in place, Normoactive BS Ext: No clubbing, cyanosis or edema.  Disposition: Improved, back to  SNF   Follow-up Appts: Discharge Orders    Future Orders Please Complete By Expires   Increase activity slowly         Follow-up with Dr.Robson, PCP in 1-2  weeks.   Tests Needing Follow-up: None   Signed: KRISHNAN,SENDIL K 12/24/2011, 1:11 PM

## 2011-12-26 ENCOUNTER — Emergency Department (HOSPITAL_COMMUNITY): Payer: Medicare Other

## 2011-12-26 ENCOUNTER — Encounter (HOSPITAL_COMMUNITY): Payer: Self-pay | Admitting: Emergency Medicine

## 2011-12-26 ENCOUNTER — Emergency Department (HOSPITAL_COMMUNITY)
Admission: EM | Admit: 2011-12-26 | Discharge: 2011-12-26 | Disposition: A | Discharge status: Skilled Nursing Facility | Payer: Medicare Other | Attending: Emergency Medicine | Admitting: Emergency Medicine

## 2011-12-26 DIAGNOSIS — E669 Obesity, unspecified: Secondary | ICD-10-CM | POA: Insufficient documentation

## 2011-12-26 DIAGNOSIS — G931 Anoxic brain damage, not elsewhere classified: Secondary | ICD-10-CM | POA: Insufficient documentation

## 2011-12-26 DIAGNOSIS — Z79899 Other long term (current) drug therapy: Secondary | ICD-10-CM | POA: Insufficient documentation

## 2011-12-26 DIAGNOSIS — N390 Urinary tract infection, site not specified: Secondary | ICD-10-CM | POA: Insufficient documentation

## 2011-12-26 DIAGNOSIS — R319 Hematuria, unspecified: Secondary | ICD-10-CM | POA: Insufficient documentation

## 2011-12-26 LAB — URINALYSIS, ROUTINE W REFLEX MICROSCOPIC
Glucose, UA: NEGATIVE mg/dL
Protein, ur: 100 mg/dL — AB
pH: 8.5 — ABNORMAL HIGH (ref 5.0–8.0)

## 2011-12-26 LAB — BASIC METABOLIC PANEL
BUN: 16 mg/dL (ref 6–23)
CO2: 20 mEq/L (ref 19–32)
Chloride: 103 mEq/L (ref 96–112)
Creatinine, Ser: 0.77 mg/dL (ref 0.50–1.35)
Glucose, Bld: 77 mg/dL (ref 70–99)
Potassium: 3.8 mEq/L (ref 3.5–5.1)

## 2011-12-26 LAB — CBC
HCT: 40.6 % (ref 39.0–52.0)
Hemoglobin: 14.5 g/dL (ref 13.0–17.0)
MCV: 80.6 fL (ref 78.0–100.0)
RDW: 13.7 % (ref 11.5–15.5)
WBC: 9 10*3/uL (ref 4.0–10.5)

## 2011-12-26 LAB — DIFFERENTIAL
Basophils Absolute: 0 10*3/uL (ref 0.0–0.1)
Basophils Relative: 0 % (ref 0–1)
Eosinophils Relative: 4 % (ref 0–5)
Lymphocytes Relative: 18 % (ref 12–46)
Lymphs Abs: 1.6 10*3/uL (ref 0.7–4.0)
Monocytes Relative: 9 % (ref 3–12)
Neutro Abs: 6.2 10*3/uL (ref 1.7–7.7)

## 2011-12-26 IMAGING — CR DG ABDOMEN ACUTE W/ 1V CHEST
6 series · 6 of 6 positions shown · non-contrast
Comparison: 10/12/2011

CLINICAL DATA: Abdominal pain, nausea, vomiting.

ACUTE ABDOMEN SERIES (ABDOMEN 2 VIEW & CHEST 1 VIEW)

[x abdomen supine]
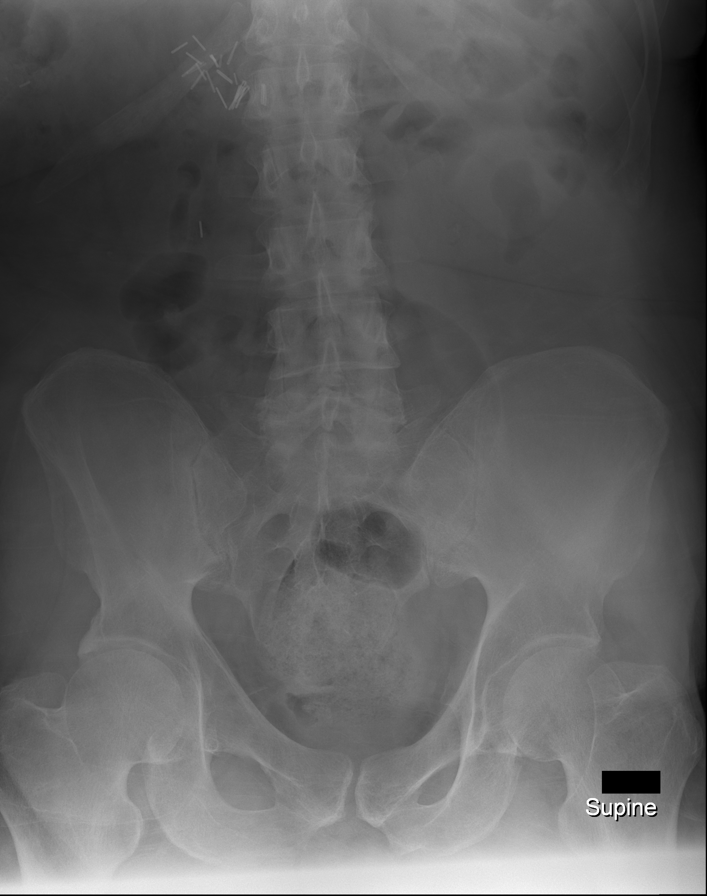

[w abdomen decub (1 of 4)]
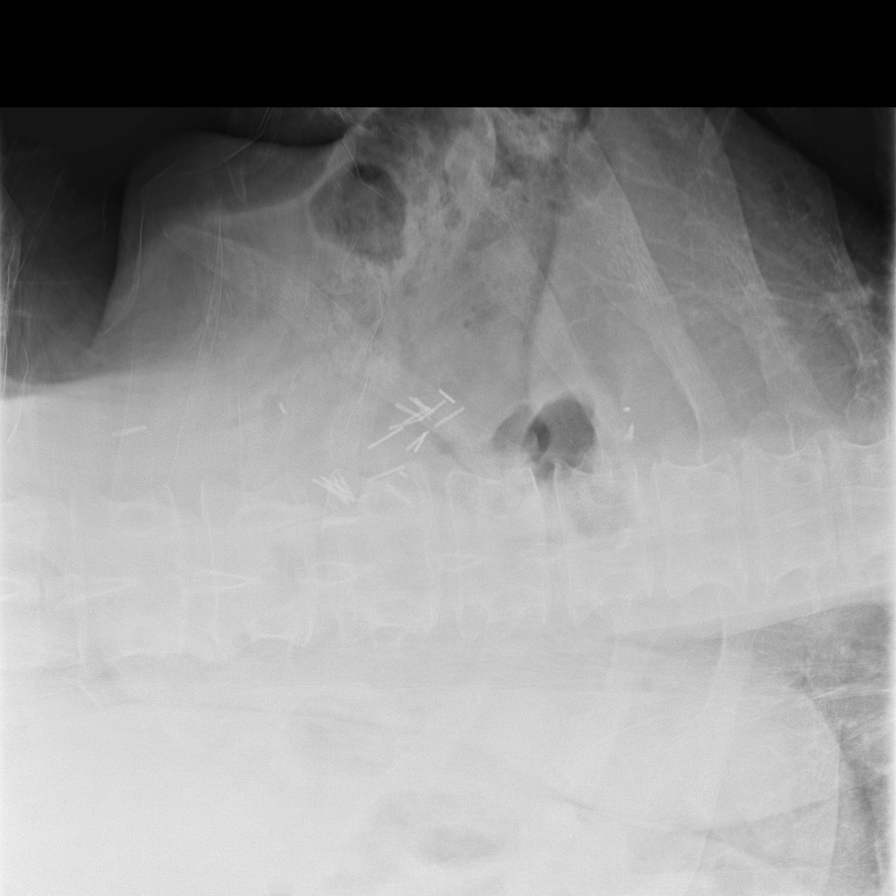

[w abdomen decub (2 of 4)]
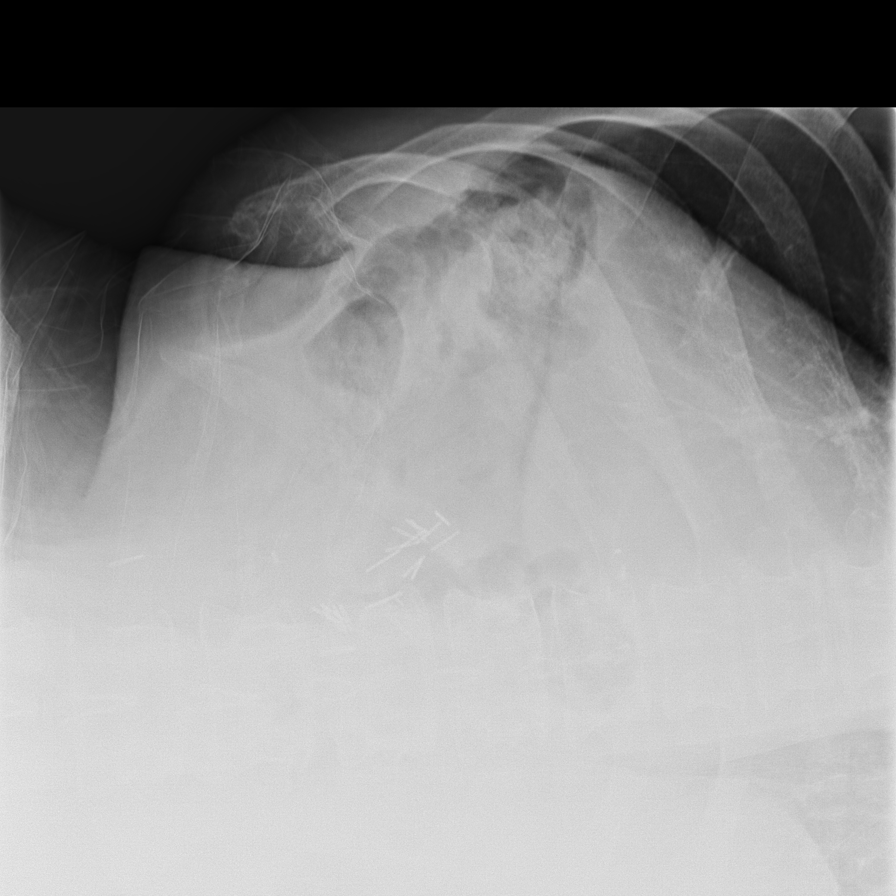

[w abdomen decub (3 of 4)]
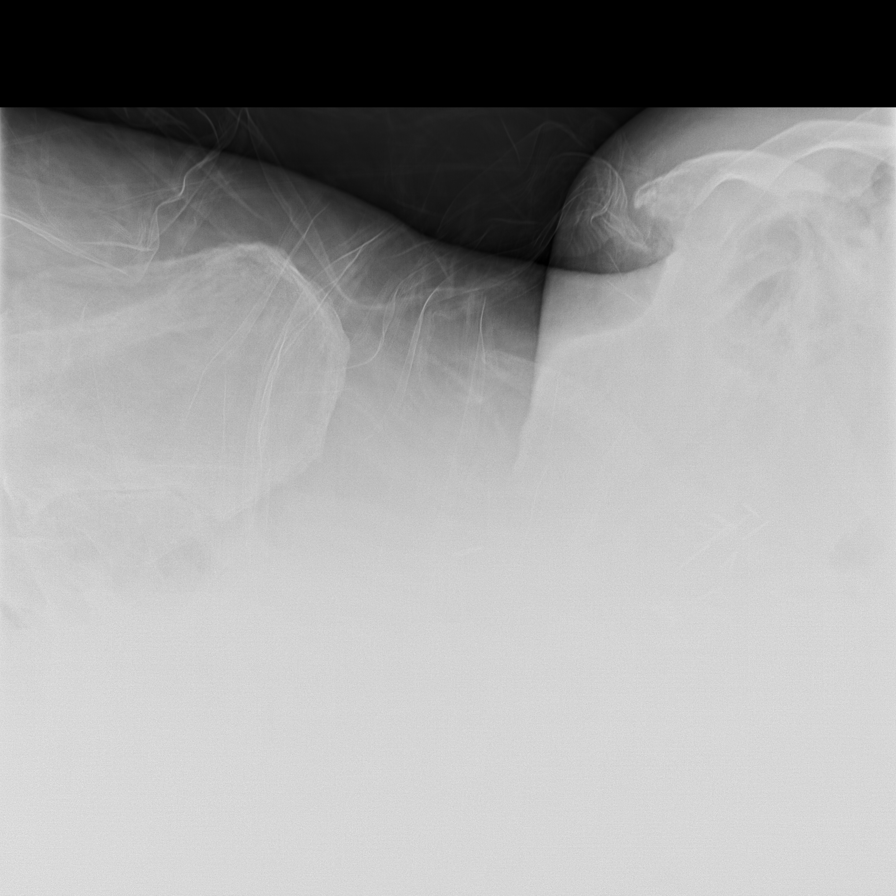

[w abdomen decub (4 of 4)]
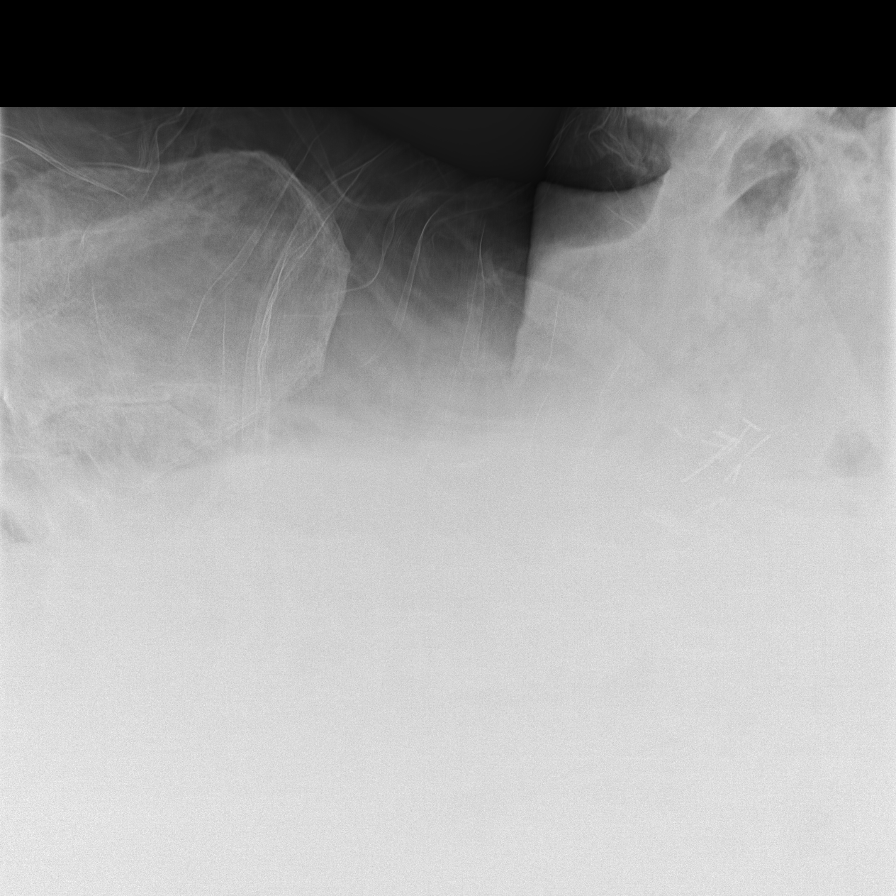

[x chest ap]
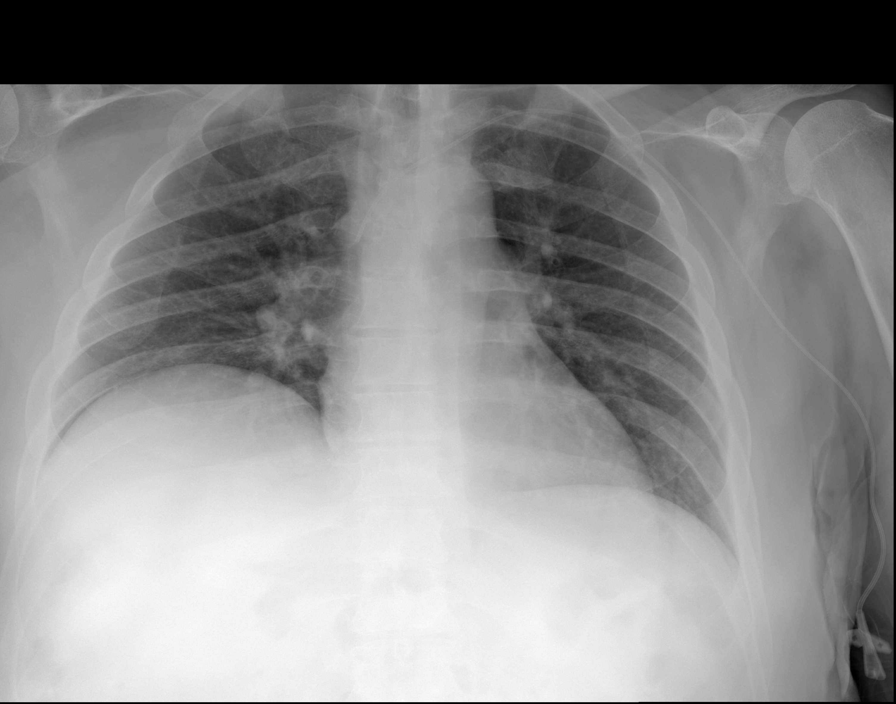

[6 of 6 positions shown; findings below may reference images not displayed]

FINDINGS: Low lung volumes.  Lungs are clear.  Heart is normal
size.  Left PICC line is in place with the tip in the SVC.
IMPRESSION: Low lung volumes.  No active disease.

## 2011-12-26 MED ORDER — LORAZEPAM 2 MG/ML IJ SOLN: 2.0000 mg | Freq: Once | INTRAMUSCULAR | Status: DC

## 2011-12-26 MED ORDER — CEPHALEXIN 250 MG/5ML PO SUSR: ORAL | Status: DC

## 2011-12-26 MED ORDER — LORAZEPAM 2 MG/ML IJ SOLN
2.0000 mg | Freq: Once | INTRAMUSCULAR | Status: AC
  Administered 2011-12-26: 2 mg via INTRAMUSCULAR
  Filled 2011-12-26: qty 1

## 2011-12-26 MED ORDER — SODIUM CHLORIDE 0.9 % IV SOLN: INTRAVENOUS | Status: DC

## 2011-12-26 MED ORDER — MORPHINE SULFATE 4 MG/ML IJ SOLN
4.0000 mg | Freq: Once | INTRAMUSCULAR | Status: AC
  Administered 2011-12-26: 4 mg via INTRAVENOUS
  Filled 2011-12-26: qty 1

## 2011-12-26 MED ORDER — CEFTRIAXONE SODIUM 1 G IJ SOLR
1.0000 g | Freq: Once | INTRAMUSCULAR | Status: AC
  Administered 2011-12-26: 1 g via INTRAMUSCULAR
  Filled 2011-12-26: qty 10

## 2011-12-26 NOTE — ED Notes (Signed)
Pt returned from ct. NAD.

## 2011-12-26 NOTE — ED Notes (Signed)
[facility] called for transport.  
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
## 2011-12-26 NOTE — ED Notes (Signed)
Pt moaning pointing to his abd that is all with gestures is commutation demostrations

## 2011-12-26 NOTE — ED Notes (Signed)
RUE:[id number]<JW> Expected date:12/26/11<BR> Expected time: 9:55 AM<BR> Means of arrival:Ambulance<BR> Comments:<BR> Medic 61/hematuria

## 2011-12-26 NOTE — ED Notes (Signed)
Pt in CT.

## 2012-01-04 ENCOUNTER — Emergency Department (HOSPITAL_COMMUNITY): Payer: Medicare Other

## 2012-01-04 ENCOUNTER — Emergency Department (HOSPITAL_COMMUNITY)
Admission: EM | Admit: 2012-01-04 | Discharge: 2012-01-05 | Disposition: A | Discharge status: Home / Self Care | Payer: Medicare Other | Attending: Emergency Medicine | Admitting: Emergency Medicine

## 2012-01-04 DIAGNOSIS — K9423 Gastrostomy malfunction: Secondary | ICD-10-CM | POA: Insufficient documentation

## 2012-01-04 DIAGNOSIS — Z431 Encounter for attention to gastrostomy: Secondary | ICD-10-CM

## 2012-01-04 DIAGNOSIS — I4891 Unspecified atrial fibrillation: Secondary | ICD-10-CM | POA: Insufficient documentation

## 2012-01-04 DIAGNOSIS — E039 Hypothyroidism, unspecified: Secondary | ICD-10-CM | POA: Insufficient documentation

## 2012-01-04 DIAGNOSIS — I252 Old myocardial infarction: Secondary | ICD-10-CM | POA: Insufficient documentation

## 2012-01-04 DIAGNOSIS — S5010XA Contusion of unspecified forearm, initial encounter: Secondary | ICD-10-CM | POA: Insufficient documentation

## 2012-01-04 DIAGNOSIS — Y9210 Unspecified residential institution as the place of occurrence of the external cause: Secondary | ICD-10-CM | POA: Insufficient documentation

## 2012-01-04 DIAGNOSIS — G8250 Quadriplegia, unspecified: Secondary | ICD-10-CM | POA: Insufficient documentation

## 2012-01-04 DIAGNOSIS — E785 Hyperlipidemia, unspecified: Secondary | ICD-10-CM | POA: Insufficient documentation

## 2012-01-04 DIAGNOSIS — X58XXXA Exposure to other specified factors, initial encounter: Secondary | ICD-10-CM | POA: Insufficient documentation

## 2012-01-04 DIAGNOSIS — Z79899 Other long term (current) drug therapy: Secondary | ICD-10-CM | POA: Insufficient documentation

## 2012-01-04 DIAGNOSIS — G40909 Epilepsy, unspecified, not intractable, without status epilepticus: Secondary | ICD-10-CM | POA: Insufficient documentation

## 2012-01-04 DIAGNOSIS — Y849 Medical procedure, unspecified as the cause of abnormal reaction of the patient, or of later complication, without mention of misadventure at the time of the procedure: Secondary | ICD-10-CM | POA: Insufficient documentation

## 2012-01-04 DIAGNOSIS — E2749 Other adrenocortical insufficiency: Secondary | ICD-10-CM | POA: Insufficient documentation

## 2012-01-04 LAB — DIFFERENTIAL
Eosinophils Absolute: 0.2 10*3/uL (ref 0.0–0.7)
Lymphocytes Relative: 20 % (ref 12–46)
Lymphs Abs: 1.9 10*3/uL (ref 0.7–4.0)
Monocytes Relative: 16 % — ABNORMAL HIGH (ref 3–12)
Neutro Abs: 5.9 10*3/uL (ref 1.7–7.7)
Neutrophils Relative %: 61 % (ref 43–77)

## 2012-01-04 LAB — CBC
Hemoglobin: 14.1 g/dL (ref 13.0–17.0)
MCH: 27.4 pg (ref 26.0–34.0)
MCV: 83.7 fL (ref 78.0–100.0)
Platelets: 191 10*3/uL (ref 150–400)
RBC: 5.15 MIL/uL (ref 4.22–5.81)
WBC: 9.6 10*3/uL (ref 4.0–10.5)
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
## 2012-01-04 NOTE — ED Notes (Signed)
ZOX:[id number]<AV> Expected date:01/04/12<BR> Expected time: 7:35 PM<BR> Means of arrival:Ambulance<BR> Comments:<BR> M80 - 45yoM blood in PEG tube

## 2012-01-04 NOTE — ED Notes (Signed)
Sent from [facility] facility for evaluation of blood in Peg Tube.  Immediate area surrounding tube appears reddened but, no blood is visible in the actual PEG tube.

## 2012-01-05 ENCOUNTER — Emergency Department (HOSPITAL_COMMUNITY)
Admission: EM | Admit: 2012-01-05 | Discharge: 2012-01-05 | Disposition: A | Discharge status: Home / Self Care | Payer: Medicare Other | Attending: Emergency Medicine | Admitting: Emergency Medicine

## 2012-01-05 ENCOUNTER — Emergency Department (HOSPITAL_COMMUNITY): Payer: Medicare Other

## 2012-01-05 ENCOUNTER — Encounter (HOSPITAL_COMMUNITY): Payer: Self-pay | Admitting: Emergency Medicine

## 2012-01-05 DIAGNOSIS — E785 Hyperlipidemia, unspecified: Secondary | ICD-10-CM | POA: Insufficient documentation

## 2012-01-05 DIAGNOSIS — F411 Generalized anxiety disorder: Secondary | ICD-10-CM | POA: Insufficient documentation

## 2012-01-05 DIAGNOSIS — I4891 Unspecified atrial fibrillation: Secondary | ICD-10-CM | POA: Insufficient documentation

## 2012-01-05 DIAGNOSIS — E039 Hypothyroidism, unspecified: Secondary | ICD-10-CM | POA: Insufficient documentation

## 2012-01-05 DIAGNOSIS — I252 Old myocardial infarction: Secondary | ICD-10-CM | POA: Insufficient documentation

## 2012-01-05 DIAGNOSIS — G8250 Quadriplegia, unspecified: Secondary | ICD-10-CM | POA: Insufficient documentation

## 2012-01-05 DIAGNOSIS — K9423 Gastrostomy malfunction: Secondary | ICD-10-CM | POA: Insufficient documentation

## 2012-01-05 DIAGNOSIS — Z79899 Other long term (current) drug therapy: Secondary | ICD-10-CM | POA: Insufficient documentation

## 2012-01-05 DIAGNOSIS — Y849 Medical procedure, unspecified as the cause of abnormal reaction of the patient, or of later complication, without mention of misadventure at the time of the procedure: Secondary | ICD-10-CM | POA: Insufficient documentation

## 2012-01-05 DIAGNOSIS — Z931 Gastrostomy status: Secondary | ICD-10-CM

## 2012-01-05 DIAGNOSIS — G40909 Epilepsy, unspecified, not intractable, without status epilepticus: Secondary | ICD-10-CM | POA: Insufficient documentation

## 2012-01-05 MED ORDER — IOHEXOL 300 MG/ML  SOLN
40.0000 mL | Freq: Once | INTRAMUSCULAR | Status: AC | PRN
  Administered 2012-01-05: 40 mL

## 2012-01-05 NOTE — ED Provider Notes (Signed)
History     CSN: [id number]  Arrival date & time 01/05/12  1128   First MD Initiated Contact with Patient 01/05/12 1129      Chief Complaint  Patient presents with  . Appointment    feeding tube out    (Consider location/radiation/quality/duration/timing/severity/associated sxs/prior treatment) HPI Comments: G-tube pulled on during morning care.  Patient is a 46 y.o. male presenting with general illness. The history is provided by the EMS personnel and the nursing home. No language interpreter was used.  Illness  The current episode started today. The onset was sudden. The problem occurs continuously. The problem has been unchanged. The problem is mild. The symptoms are relieved by nothing. The symptoms are aggravated by nothing. Pertinent negatives include no fever, no diarrhea and no vomiting.    Past Medical History  Diagnosis Date  . Addison disease   . Anoxic brain injury   . A-fib   . Seizure disorder   . Dysphagia   . History of recurrent UTIs   . Hyperlipemia   . Aspiration pneumonia   . Mental disorder   . Anxiety   . Reflux   . Dysphagia   . Glucocorticoid deficiency   . Atrial fibrillation   . Glaucoma   . Hypothyroidism   . Encephalopathy   . Quadriplegia   . Addison disease   . Myocardial infarction     Past Surgical History  Procedure Date  . Nephrectomy unknown    History reviewed. No pertinent family history.  History  Substance Use Topics  . Smoking status: Never Smoker   . Smokeless tobacco: Never Used  . Alcohol Use: No      Review of Systems  Unable to perform ROS: Other  Constitutional: Negative for fever.  Gastrointestinal: Negative for vomiting and diarrhea.    Allergies  Latex; Morphine and related; Penicillins; Pyridium; and Soy allergy  Home Medications   Current Outpatient Rx  Name Route Sig Dispense Refill  . CEPHALEXIN 250 MG/5ML PO SUSR PEG Tube 500 mg by PEG Tube route 4 (four) times daily. Take 10 ml  Per  feeding tube qid for 10 days     . CITALOPRAM HYDROBROMIDE 20 MG PO TABS PEG Tube 20 mg by PEG Tube route daily.     Marland Kitchen PRO-STAT 64 PO LIQD PEG Tube 30 mLs by PEG Tube route 2 (two) times daily.     Marland Kitchen FERROUS SULFATE 325 (65 FE) MG PO TABS PEG Tube 325 mg by PEG Tube route 3 (three) times daily with meals.     . OMEGA-3 FATTY ACIDS 1000 MG PO CAPS Oral Take 1 g by mouth 2 (two) times daily.     Marland Kitchen HALOPERIDOL 2 MG PO TABS PEG Tube 2 mg by PEG Tube route 2 (two) times daily as needed. agitation    . IPRATROPIUM-ALBUTEROL 0.5-2.5 (3) MG/3ML IN SOLN Nebulization Take 3 mLs by nebulization every 6 (six) hours as needed. SHORTNESS OF BREATH     . METHIMAZOLE 5 MG PO TABS Oral Take 5 mg by mouth 2 (two) times daily.     . METHYLCELLULOSE 1 % OP SOLN Both Eyes Place 1 drop into both eyes 4 (four) times daily.      Marland Kitchen MINERAL OIL RE ENEM Rectal Place 1 enema rectally as needed. FOR CONSTIPATION     . PANTOPRAZOLE 40 MG/20 ML SUSPENSION PEG Tube 40 mg by PEG Tube route daily at 12 noon.      Marland Kitchen POLYETHYLENE GLYCOL 3350  PO PACK PEG Tube 17 g by PEG Tube route 2 (two) times daily.     Marland Kitchen PRESCRIPTION MEDICATION PEG Tube 12.5 mg by PEG Tube route 2 (two) times daily. METOPROLOL 6.25MG /5ML SUSPENSION...the patient takes 10ml which is 12.5mg  dosage       BP 104/78  Pulse 77  Temp(Src) 98.5 F (36.9 C) (Oral)  Resp 16  SpO2 95%  Physical Exam  Constitutional: He is oriented to person, place, and time. He appears well-developed and well-nourished. No distress.  HENT:  Head: Normocephalic and atraumatic.  Mouth/Throat: No oropharyngeal exudate.  Eyes: EOM are normal. Pupils are equal, round, and reactive to light.  Neck: Normal range of motion. Neck supple.  Cardiovascular: Normal rate and regular rhythm.  Exam reveals no friction rub.   No murmur heard. Pulmonary/Chest: Effort normal and breath sounds normal. No respiratory distress. He has no wheezes. He has no rales.  Abdominal: He exhibits no  distension. There is no tenderness. There is no rebound.       16 French Foley in place. Patient's tube is at 3 cm deep at the skin level  Musculoskeletal: Normal range of motion. He exhibits no edema.  Neurological: He is alert and oriented to person, place, and time.  Skin: He is not diaphoretic.    ED Course  Procedures (including critical care time)  Labs Reviewed - No data to display Dg Abd 1 View  01/04/2012  *RADIOLOGY REPORT*  Clinical Data: Peg tube and bleeding.  ABDOMEN - 1 VIEW  Comparison: Abdomen pelvis CT 12/26/2011  Findings: A percutaneous gastrostomy tube projects over the left upper quadrant .  Bowel gas pattern is nonobstructive.  Tiny rounded areas of high density material projects over the lower pelvis, in the expected location of the rectum, unchanged from prior CT.  Surgical clips effect over the right upper abdomen.  IMPRESSION: Nonobstructive bowel gas pattern.  Gastrostomy tube projects over the left upper quadrant.  Original Report Authenticated By: Britta Mccreedy, M.D.     1. Gastrostomy in place       MDM  46 year old male presents for G-tube problem. Was toe done during morning care and there is concern it is out. It was replaced last night at [facility]. Will check abdomen x-ray with injection of contrast through the G-tube.  X-ray shows G-tube in place. Patient discharged back to his nursing facility.     Elwin Mocha, MD 01/05/12 [phone]

## 2012-01-05 NOTE — ED Notes (Signed)
Pt from [facility] with pt requesting feeding tube replacement; per facility tube came out yesteray; pt was at [facility] for same thing yesterday

## 2012-01-05 NOTE — ED Notes (Signed)
EMS here--report given--Discharge papers given to EMS for delivery to facility.  Pt. stable

## 2012-01-05 NOTE — ED Provider Notes (Signed)
  I performed a history and physical examination of [name] and discussed his management with Dr. Gwendolyn Grant.  I agree with the history, physical, assessment, and plan of care, with the following exceptions: None  Presents for G-tube evaluation. There is concern whether or not it had been partially removed during his routine care.  G-tube in place in the abdomen. He has no complaints. Gtube study shows the tube is still within the stomach.  I was present for the following procedures: None Time Spent in Critical Care of the patient: None  Tildon Husky, MD 01/05/12 [phone]

## 2012-01-05 NOTE — ED Notes (Signed)
EMS called to transport pt. Back to nursing facility

## 2012-01-06 ENCOUNTER — Emergency Department (HOSPITAL_COMMUNITY)
Admission: EM | Admit: 2012-01-06 | Discharge: 2012-01-07 | Disposition: A | Discharge status: Home / Self Care | Payer: Medicare Other | Attending: Emergency Medicine | Admitting: Emergency Medicine

## 2012-01-06 DIAGNOSIS — G40909 Epilepsy, unspecified, not intractable, without status epilepticus: Secondary | ICD-10-CM | POA: Insufficient documentation

## 2012-01-06 DIAGNOSIS — R319 Hematuria, unspecified: Secondary | ICD-10-CM | POA: Insufficient documentation

## 2012-01-06 DIAGNOSIS — Z79899 Other long term (current) drug therapy: Secondary | ICD-10-CM | POA: Insufficient documentation

## 2012-01-06 DIAGNOSIS — G8250 Quadriplegia, unspecified: Secondary | ICD-10-CM | POA: Insufficient documentation

## 2012-01-06 DIAGNOSIS — F79 Unspecified intellectual disabilities: Secondary | ICD-10-CM | POA: Insufficient documentation

## 2012-01-06 DIAGNOSIS — I252 Old myocardial infarction: Secondary | ICD-10-CM | POA: Insufficient documentation

## 2012-01-06 DIAGNOSIS — R29898 Other symptoms and signs involving the musculoskeletal system: Secondary | ICD-10-CM | POA: Insufficient documentation

## 2012-01-06 NOTE — ED Notes (Signed)
RUE:[id number]<JW> Expected date:01/06/12<BR> Expected time:10:05 PM<BR> Means of arrival:Ambulance<BR> Comments:<BR> EMS 261 GC, 45 yom hematuria/non-ambulatory Foley cath

## 2012-01-06 NOTE — ED Notes (Signed)
Pt from [facility] facility. Had his foley catheter replaced a couple of weeks ago. Staff noticed blood in his urine this am and called EMS. Pt has history of TMI, stroke, psychosis, and unspecified quadriplegia. Pt is non communicative at his time.  Foley urine bag switched out due to clots in the original bag and not draining.

## 2012-01-06 NOTE — ED Notes (Signed)
Unable to obtain urine specimen. Patient as 10cc syringe connected to foley port for collection but no urine activity

## 2012-01-06 NOTE — ED Notes (Signed)
Pt started having blood in his urine this am. Had a foley catheter inserted two weeks ago, but bleeding started today. Pt had a stroke and MI in the past. Pt is not forthcoming with verbalization.

## 2012-01-06 NOTE — ED Provider Notes (Signed)
Medical screening examination/treatment/procedure(s) were performed by non-physician practitioner and as supervising physician I was immediately available for consultation/collaboration.  Flint Melter, MD 01/06/12 1106

## 2012-01-06 NOTE — ED Provider Notes (Signed)
History     CSN: [id number]  Arrival date & time 01/06/12  2211   First MD Initiated Contact with Patient 01/06/12 2255      Chief Complaint  Patient presents with  . Hematuria    (Consider location/radiation/quality/duration/timing/severity/associated sxs/prior treatment) HPI Comments: 46 year old male with a history of stroke, seizure disorder, and anoxic brain injury with history of urinary tract infection in the last month who presents with a complaint of hematuria. The patient is unable to speak clearly and has no complaints when he nonsense had about location of pain. He also is able to make it is not vomiting has not had subjective fevers. According to the nursing staff there noticed hematuria in his Foley bag today. There has been no associated fever, vomiting, swelling per the nursing home staff.  Patient is a 46 y.o. male presenting with hematuria. The history is provided by the patient, medical records and the EMS personnel. The history is limited by the condition of the patient (Minimal verbal communication).  Hematuria    Past Medical History  Diagnosis Date  . Addison disease   . Anoxic brain injury   . A-fib   . Seizure disorder   . Dysphagia   . History of recurrent UTIs   . Hyperlipemia   . Aspiration pneumonia   . Mental disorder   . Anxiety   . Reflux   . Dysphagia   . Glucocorticoid deficiency   . Atrial fibrillation   . Glaucoma   . Hypothyroidism   . Encephalopathy   . Quadriplegia   . Addison disease   . Myocardial infarction     Past Surgical History  Procedure Date  . Nephrectomy unknown    No family history on file.  History  Substance Use Topics  . Smoking status: Never Smoker   . Smokeless tobacco: Never Used  . Alcohol Use: No      Review of Systems  Unable to perform ROS: Other  Genitourinary: Positive for hematuria.    Allergies  Latex; Morphine and related; Penicillins; Pyridium; and Soy allergy  Home Medications     Current Outpatient Rx  Name Route Sig Dispense Refill  . CEPHALEXIN 250 MG/5ML PO SUSR PEG Tube 500 mg by PEG Tube route 4 (four) times daily. Take 10 ml  Per feeding tube qid for 10 days    . CITALOPRAM HYDROBROMIDE 20 MG PO TABS PEG Tube 20 mg by PEG Tube route daily.     Marland Kitchen PRO-STAT 64 PO LIQD PEG Tube 30 mLs by PEG Tube route 2 (two) times daily.     Marland Kitchen FERROUS SULFATE 220 (44 FE) MG/5ML PO ELIX PEG Tube 325 mg by PEG Tube route 3 (three) times daily.    . OMEGA-3 FATTY ACIDS 1000 MG PO CAPS Oral Take 1 g by mouth 2 (two) times daily.     Marland Kitchen METHIMAZOLE 5 MG PO TABS Oral Take 5 mg by mouth 2 (two) times daily.     . METHYLCELLULOSE 1 % OP SOLN Both Eyes Place 1 drop into both eyes 4 (four) times daily.      Marland Kitchen VITAL 1.5 CAL PO LIQD Per Tube Place 42 mLs into feeding tube every hour.      Marland Kitchen PANTOPRAZOLE 40 MG/20 ML SUSPENSION PEG Tube 40 mg by PEG Tube route daily at 12 noon.     Marland Kitchen POLYETHYLENE GLYCOL 3350 PO PACK PEG Tube 17 g by PEG Tube route 2 (two) times daily. Mix 17gm 4-8 ounces  of liquid    . PRESCRIPTION MEDICATION PEG Tube 12.5 mg by PEG Tube route 2 (two) times daily. METOPROLOL 6.25MG /5ML SUSPENSION...the patient takes 10ml which is 12.5mg  dosage    . CIPROFLOXACIN HCL 500 MG PO TABS Per Tube Place 1 tablet (500 mg total) into feeding tube every 12 (twelve) hours. 20 tablet 0    BP 114/72  Pulse 93  Temp(Src) 99.3 F (37.4 C) (Oral)  Resp 18  SpO2 96%  Physical Exam  Nursing note and vitals reviewed. Constitutional: No distress.  HENT:  Head: Normocephalic and atraumatic.  Mouth/Throat: Oropharynx is clear and moist. No oropharyngeal exudate.  Eyes: Conjunctivae and EOM are normal. Pupils are equal, round, and reactive to light. Right eye exhibits no discharge. Left eye exhibits no discharge. No scleral icterus.  Neck: Normal range of motion. Neck supple. No JVD present. No thyromegaly present.  Cardiovascular: Normal rate, regular rhythm, normal heart sounds and intact  distal pulses.  Exam reveals no gallop and no friction rub.   No murmur heard. Pulmonary/Chest: Effort normal and breath sounds normal. No respiratory distress. He has no wheezes. He has no rales.  Abdominal: Soft. Bowel sounds are normal. He exhibits no distension and no mass. There is no tenderness.  Genitourinary:       Normal appearing penis, scrotum, testicles. Foley catheter firmly sat in the bladder through the urethra. No leakage around the Foley catheter.  Musculoskeletal: Normal range of motion. He exhibits no edema.  Lymphadenopathy:    He has no cervical adenopathy.  Neurological: He is alert.       Patient is able to use his hands and feet to help digest in the bed but has generalized weakness of both upper and lower extremities.  Skin: Skin is warm and dry. No rash noted. He is not diaphoretic. No erythema.  Psychiatric: He has a normal mood and affect. His behavior is normal.    ED Course  Procedures (including critical care time)  Labs Reviewed  URINALYSIS, ROUTINE W REFLEX MICROSCOPIC - Abnormal; Notable for the following:    Color, Urine RED (*) BIOCHEMICALS MAY BE AFFECTED BY COLOR   APPearance TURBID (*)    Hgb urine dipstick LARGE (*)    Bilirubin Urine LARGE (*)    Ketones, ur 15 (*)    Protein, ur >300 (*)    Nitrite POSITIVE (*)    Leukocytes, UA MODERATE (*)    All other components within normal limits  BASIC METABOLIC PANEL - Abnormal; Notable for the following:    Sodium 132 (*)    All other components within normal limits  URINE MICROSCOPIC-ADD ON - Abnormal; Notable for the following:    Bacteria, UA FIELD OBSCURED BY RBC'S (*)    All other components within normal limits  CBC  URINE CULTURE   Dg Abd 1 View  01/05/2012  *RADIOLOGY REPORT*  Clinical Data: Verify g-tube placement  ABDOMEN - 1 VIEW  Comparison: 01/04/2012  Findings: 40 ml Omnipaque-300 contrast was administered via indwelling gastrostomy tube.  Contrast opacifies the stomach, confirming  intraluminal placement. No extravasation of contrast is seen.  Surgical clips in the right abdomen.  IMPRESSION: Contrast opacifies the stomach, confirming intraluminal placement of indwelling gastrostomy tube.  Original Report Authenticated By: Charline Bills, M.D.     1. Hematuria       MDM  Vital signs show no fever, no tachycardia, no hypoxia and normal blood pressure. Urine in the Foley catheter appears bloody, there was blood clots  in the initial bag which was replaced.  We'll send for urinalysis with urine culture. Otherwise patient appears well with no focal exam findings, normal vital signs and without complaint   Urinalysis shows hematuria and likely infection. Ciprofloxacin given prior to discharge. Lab work otherwise unremarkable. Patient also given some IV fluids prior to discharge. Foley catheter is draining appropriately.     Vida Roller, MD 01/07/12 [phone]

## 2012-01-07 LAB — URINALYSIS, ROUTINE W REFLEX MICROSCOPIC
Glucose, UA: NEGATIVE mg/dL
Protein, ur: >300 mg/dL — AB
Specific Gravity, Urine: 1.028 (ref 1.005–1.030)
pH: 5 (ref 5.0–8.0)

## 2012-01-07 LAB — CBC
MCH: 28.3 pg (ref 26.0–34.0)
MCHC: 34.9 g/dL (ref 30.0–36.0)
MCV: 81.1 fL (ref 78.0–100.0)
Platelets: 226 10*3/uL (ref 150–400)
RBC: 4.87 MIL/uL (ref 4.22–5.81)

## 2012-01-07 LAB — URINE CULTURE
Colony Count: NO GROWTH
Special Requests: NORMAL

## 2012-01-07 LAB — URINE MICROSCOPIC-ADD ON

## 2012-01-07 LAB — BASIC METABOLIC PANEL
CO2: 20 mEq/L (ref 19–32)
Calcium: 9.2 mg/dL (ref 8.4–10.5)
Creatinine, Ser: 0.94 mg/dL (ref 0.50–1.35)
Glucose, Bld: 71 mg/dL (ref 70–99)

## 2012-01-07 MED ORDER — CIPROFLOXACIN HCL 500 MG PO TABS: 500.0000 mg | ORAL_TABLET | Freq: Two times a day (BID) | ORAL | Status: DC

## 2012-01-07 MED ORDER — CIPROFLOXACIN HCL 500 MG PO TABS: 500.0000 mg | ORAL_TABLET | Freq: Two times a day (BID) | ORAL | Status: AC

## 2012-01-07 MED ORDER — CIPROFLOXACIN IN D5W 400 MG/200ML IV SOLN
400.0000 mg | Freq: Once | INTRAVENOUS | Status: AC
  Administered 2012-01-07: 400 mg via INTRAVENOUS
  Filled 2012-01-07: qty 200

## 2012-01-07 MED ORDER — SODIUM CHLORIDE 0.9 % IV BOLUS (SEPSIS)
1000.0000 mL | Freq: Once | INTRAVENOUS | Status: AC
  Administered 2012-01-07: 1000 mL via INTRAVENOUS

## 2012-01-07 NOTE — ED Notes (Signed)
Pt waiting for [facility] to transport him back to [facility]

## 2012-01-10 ENCOUNTER — Encounter (HOSPITAL_COMMUNITY): Payer: Self-pay | Admitting: *Deleted

## 2012-01-10 ENCOUNTER — Emergency Department (HOSPITAL_COMMUNITY)
Admission: EM | Admit: 2012-01-10 | Discharge: 2012-01-10 | Disposition: A | Discharge status: Skilled Nursing Facility | Payer: Medicare Other | Attending: Emergency Medicine | Admitting: Emergency Medicine

## 2012-01-10 ENCOUNTER — Emergency Department (HOSPITAL_COMMUNITY): Payer: Medicare Other

## 2012-01-10 DIAGNOSIS — E785 Hyperlipidemia, unspecified: Secondary | ICD-10-CM | POA: Insufficient documentation

## 2012-01-10 DIAGNOSIS — R1310 Dysphagia, unspecified: Secondary | ICD-10-CM | POA: Insufficient documentation

## 2012-01-10 DIAGNOSIS — E039 Hypothyroidism, unspecified: Secondary | ICD-10-CM | POA: Insufficient documentation

## 2012-01-10 DIAGNOSIS — Z79899 Other long term (current) drug therapy: Secondary | ICD-10-CM | POA: Insufficient documentation

## 2012-01-10 DIAGNOSIS — I4891 Unspecified atrial fibrillation: Secondary | ICD-10-CM | POA: Insufficient documentation

## 2012-01-10 DIAGNOSIS — T7589XS Other specified effects of external causes, sequela: Secondary | ICD-10-CM | POA: Insufficient documentation

## 2012-01-10 DIAGNOSIS — G40802 Other epilepsy, not intractable, without status epilepticus: Secondary | ICD-10-CM | POA: Insufficient documentation

## 2012-01-10 DIAGNOSIS — E2749 Other adrenocortical insufficiency: Secondary | ICD-10-CM | POA: Insufficient documentation

## 2012-01-10 DIAGNOSIS — H409 Unspecified glaucoma: Secondary | ICD-10-CM | POA: Insufficient documentation

## 2012-01-10 DIAGNOSIS — G8250 Quadriplegia, unspecified: Secondary | ICD-10-CM | POA: Insufficient documentation

## 2012-01-10 DIAGNOSIS — T788XXS Other adverse effects, not elsewhere classified, sequela: Secondary | ICD-10-CM | POA: Insufficient documentation

## 2012-01-10 DIAGNOSIS — I252 Old myocardial infarction: Secondary | ICD-10-CM | POA: Insufficient documentation

## 2012-01-10 DIAGNOSIS — X58XXXS Exposure to other specified factors, sequela: Secondary | ICD-10-CM | POA: Insufficient documentation

## 2012-01-10 DIAGNOSIS — K9423 Gastrostomy malfunction: Secondary | ICD-10-CM

## 2012-01-10 DIAGNOSIS — F411 Generalized anxiety disorder: Secondary | ICD-10-CM | POA: Insufficient documentation

## 2012-01-10 LAB — DIFFERENTIAL
Eosinophils Relative: 5 % (ref 0–5)
Lymphocytes Relative: 29 % (ref 12–46)
Lymphs Abs: 2.5 10*3/uL (ref 0.7–4.0)
Monocytes Absolute: 0.9 10*3/uL (ref 0.1–1.0)

## 2012-01-10 LAB — CBC
HCT: 38.4 % — ABNORMAL LOW (ref 39.0–52.0)
MCV: 82.2 fL (ref 78.0–100.0)
RBC: 4.67 MIL/uL (ref 4.22–5.81)
WBC: 8.5 10*3/uL (ref 4.0–10.5)

## 2012-01-10 LAB — WOUND CULTURE: Special Requests: NORMAL

## 2012-01-10 NOTE — ED Notes (Signed)
RUE:[id number]<JW> Expected date:01/10/12<BR> Expected time: 2:51 AM<BR> Means of arrival:Ambulance<BR> Comments:<BR> PEG tube placement verification.

## 2012-01-10 NOTE — ED Notes (Signed)
Patient is lethargic with minimal response to stimuli.  This is within his normal..  PTAR was given DC instructions and follow up visit instructions.  Patient gave verbal understanding.  He was DC via stretcher to SNF.  V/S stable.  He was not showing any signs of distress on DC

## 2012-01-10 NOTE — ED Notes (Signed)
Pt PEG was pulled out (per SNF the "abcess in the area pushed it out").  PEG was re-inserted by nurse in SNF and he is here for x-ray conformation of proper placement of peg.  This has occurred several times with nurse replacing it at [facility]

## 2012-01-10 NOTE — ED Provider Notes (Signed)
Feeding tube had been replaced and x-ray following instilling of contrast material suggested that the tube might actually be lodged in the tract and not in the stomach. I have gone to evaluate the patient and the tube has not advanced as it should. Balloon is deflated and the tube was removed and reinserted and this time seem to go all the way into the stomach. Balloon is inflated and withdrawn gets a definite endpoint. It is easy to advance the tube and pulled it back. X-ray following instilling contrast material will be repeated.  Dione Booze, MD 01/10/12 [phone]

## 2012-01-10 NOTE — ED Provider Notes (Signed)
Medical screening examination/treatment/procedure(s) were conducted as a shared visit with non-physician practitioner(s) and myself.  I personally evaluated the patient during the encounter   Dione Booze, MD 01/10/12 [phone]

## 2012-01-10 NOTE — ED Provider Notes (Signed)
History     CSN: [id number]  Arrival date & time 01/10/12  0307   First MD Initiated Contact with Patient 01/10/12 [phone]      Chief Complaint  Patient presents with  . GI Problem    Peg tube was re-inserted.  SNF requests confirmation of proper placement     HPI  History provided by past charts and nursing home report. Patient is a 46 year old male with history of anoxic brain injury, seizure disorder, Addison's disease, quadrant plegia who presents from a nursing home with complaints of having dislodged PEG feeding tube. Patient is able to not head yes and no for some questioning. Patient had his PEG tube come out earlier this evening. The nurses on shift tried to reinsert PEG tube. They sent patient here for evaluation of placement and function. Patient was recently seen in the emergency room 3 days ago for concerns of hematuria in his Foley catheter. Patient was found to have urinary tract infection and is currently taking ciprofloxacin and Keflex. There've been no reported fevers from the nursing home. There are no other complaints.     Past Medical History  Diagnosis Date  . Addison disease   . Anoxic brain injury   . A-fib   . Seizure disorder   . Dysphagia   . History of recurrent UTIs   . Hyperlipemia   . Aspiration pneumonia   . Mental disorder   . Anxiety   . Reflux   . Dysphagia   . Glucocorticoid deficiency   . Atrial fibrillation   . Glaucoma   . Hypothyroidism   . Encephalopathy   . Quadriplegia   . Addison disease   . Myocardial infarction     Past Surgical History  Procedure Date  . Nephrectomy unknown  . Peg tube placement     No family history on file.  History  Substance Use Topics  . Smoking status: Never Smoker   . Smokeless tobacco: Never Used  . Alcohol Use: No      Review of Systems  Unable to perform ROS  level V appliance.    Allergies  Latex; Morphine and related; Penicillins; Pyridium; and Soy allergy  Home Medications    Current Outpatient Rx  Name Route Sig Dispense Refill  . CEPHALEXIN 250 MG/5ML PO SUSR PEG Tube 500 mg by PEG Tube route 4 (four) times daily. Take 10 ml  Per feeding tube qid for 10 days    . CIPROFLOXACIN HCL 500 MG PO TABS Per Tube Place 1 tablet (500 mg total) into feeding tube every 12 (twelve) hours. 20 tablet 0  . CITALOPRAM HYDROBROMIDE 20 MG PO TABS PEG Tube 20 mg by PEG Tube route daily.     Marland Kitchen PRO-STAT 64 PO LIQD PEG Tube 30 mLs by PEG Tube route 2 (two) times daily.     Marland Kitchen FERROUS SULFATE 220 (44 FE) MG/5ML PO ELIX PEG Tube 325 mg by PEG Tube route 3 (three) times daily.    . OMEGA-3 FATTY ACIDS 1000 MG PO CAPS Oral Take 1 g by mouth 2 (two) times daily.     Marland Kitchen METHIMAZOLE 5 MG PO TABS Oral Take 5 mg by mouth 2 (two) times daily.     . METHYLCELLULOSE 1 % OP SOLN Both Eyes Place 1 drop into both eyes 4 (four) times daily.      Marland Kitchen VITAL 1.5 CAL PO LIQD Per Tube Place 42 mLs into feeding tube every hour.      Marland Kitchen  PANTOPRAZOLE 40 MG/20 ML SUSPENSION PEG Tube 40 mg by PEG Tube route daily at 12 noon.     Marland Kitchen POLYETHYLENE GLYCOL 3350 PO PACK PEG Tube 17 g by PEG Tube route 2 (two) times daily. Mix 17gm 4-8 ounces of liquid    . PRESCRIPTION MEDICATION PEG Tube 12.5 mg by PEG Tube route 2 (two) times daily. METOPROLOL 6.25MG /5ML SUSPENSION...the patient takes 10ml which is 12.5mg  dosage      BP 107/69  Pulse 70  Temp(Src) 98.6 F (37 C) (Oral)  Resp 16  SpO2 98%  Physical Exam  Nursing note and vitals reviewed. Constitutional: He appears well-developed and well-nourished.  HENT:  Head: Normocephalic and atraumatic.  Cardiovascular: Normal rate and regular rhythm.   Pulmonary/Chest: Effort normal and breath sounds normal. No respiratory distress. He has no wheezes. He has no rales.  Abdominal: Soft. He exhibits no distension. There is no guarding.       5 cm area of erythema and induration above the area of the PEG tube insertion. Patient shakes his head no with questions of pain to  area. He does not grimace. There is no fluctuance.  Genitourinary:       Foley catheter in place  Neurological:       Patient had baseline mental status. Patient is awake and opens eyes to command. He is able to not head yes and no for some questioning.    ED Course  Procedures    Labs Reviewed  CBC  DIFFERENTIAL   Results for orders placed during the hospital encounter of 01/10/12  CBC      Component Value Range   WBC 8.5  4.0 - 10.5 (K/uL)   RBC 4.67  4.22 - 5.81 (MIL/uL)   Hemoglobin 13.3  13.0 - 17.0 (g/dL)   HCT 78.2 (*) 95.6 - 52.0 (%)   MCV 82.2  78.0 - 100.0 (fL)   MCH 28.5  26.0 - 34.0 (pg)   MCHC 34.6  30.0 - 36.0 (g/dL)   RDW 21.3  08.6 - 57.8 (%)   Platelets 210  150 - 400 (K/uL)  DIFFERENTIAL      Component Value Range   Neutrophils Relative 56  43 - 77 (%)   Neutro Abs 4.8  1.7 - 7.7 (K/uL)   Lymphocytes Relative 29  12 - 46 (%)   Lymphs Abs 2.5  0.7 - 4.0 (K/uL)   Monocytes Relative 10  3 - 12 (%)   Monocytes Absolute 0.9  0.1 - 1.0 (K/uL)   Eosinophils Relative 5  0 - 5 (%)   Eosinophils Absolute 0.4  0.0 - 0.7 (K/uL)   Basophils Relative 0  0 - 1 (%)   Basophils Absolute 0.0  0.0 - 0.1 (K/uL)      Dg Abd Portable 1v  01/10/2012  *RADIOLOGY REPORT*  Clinical Data: Assess PEG tube placement.  PORTABLE ABDOMEN - 1 VIEW  Comparison: CT of the abdomen and pelvis performed 12/26/2011, and abdominal radiograph performed 01/05/2012  Findings: The position of the PEG tube is unchanged from the recent prior radiograph.  However, it is more inferiorly positioned than on the prior CT.  Injected contrast does extend into the body and antrum of the stomach, and into the proximal duodenum.  Narrowing of the apparent lumen at the site of injection may reflect persistent surrounding soft tissue inflammation.  The visualized bowel gas pattern is unremarkable.  Scattered clips are noted about the mid abdomen.  No acute osseous abnormalities are seen.  Mild degenerative  change is noted at the lower lumbar spine.  IMPRESSION: Stable appearance to position of PEG tube from the recent prior radiograph; it appears more inferiorly positioned than on the prior CT, but injected contrast does extend into the stomach.  Narrowing of the apparent lumen at the site of injection may reflect persistent surrounding soft tissue inflammation.  Would suggest clinical correlation as to whether the PEG tube mushroom is palpable; if so, this could conceivably reflect a patent tract rather than an appropriately positioned PEG tube.  Original Report Authenticated By: Tonia Ghent, M.D.     1. PEG tube malfunction       MDM  3:30 AM patient seen and evaluated. Patient no acute distress.  Patient seen and discussed with attending physician. PEG tube repositioned and felt to be in place the stomach.  Final x-ray pending. Patient signed out to [facility].      Angus Seller, [place] 01/10/12 [phone]

## 2012-01-12 NOTE — ED Notes (Signed)
Results called from [facility].   Wound above Peg insertion site -> (+) E Coli, ESBL.  Pt on Cipro -> Resistant to the same.  Results faxed to pts PCP Dr Baltazar Najjar w/[facility] @ [phone].

## 2012-01-23 IMAGING — DX DG ABDOMEN 1V
1 series · 1 of 1 positions shown · non-contrast
Comparison: CT 12/21/2011

CLINICAL DATA: G tube placement confirmation

ABDOMEN - 1 VIEW

[AP]
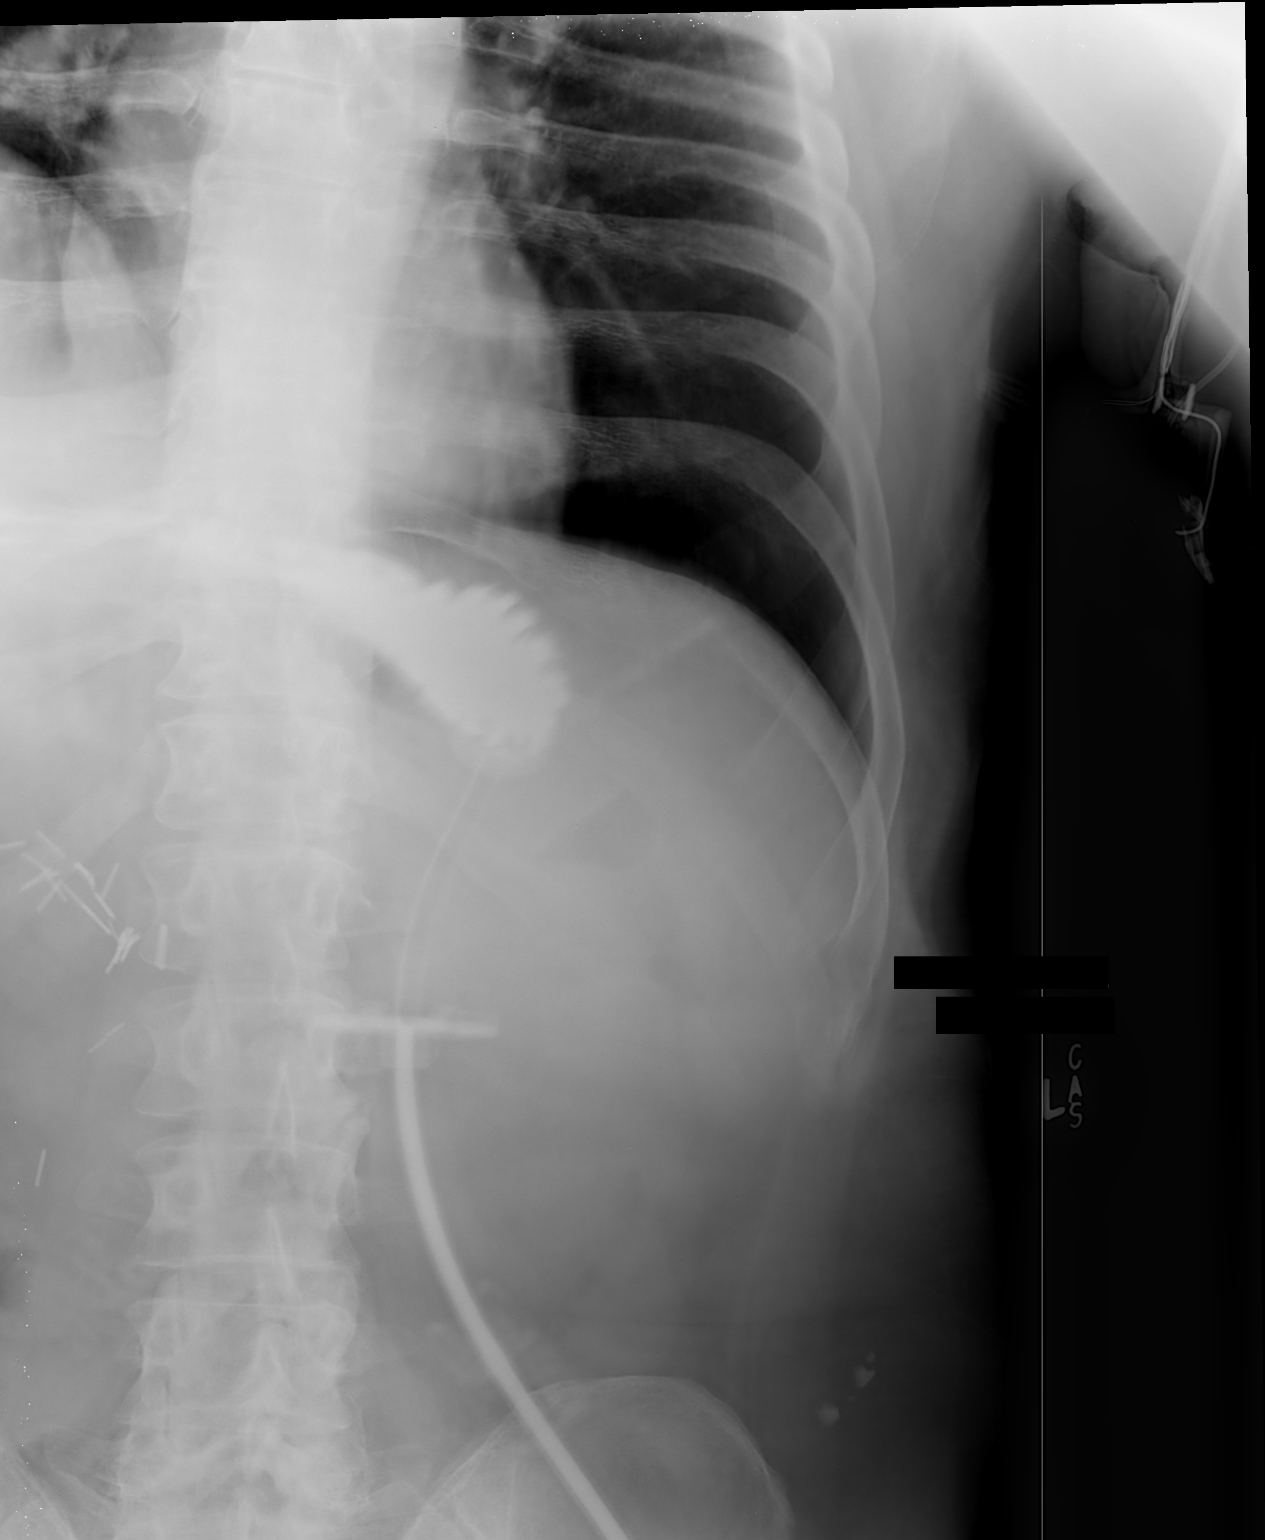

[1 of 1 positions shown; findings below may reference images not displayed]

FINDINGS: Hand injection of small volume water-soluble contrast
through the percutaneous gastrostomy tube layers dependently within
the stomach.
IMPRESSION: Injection of small volume of contrast fills the stomach.

## 2012-01-23 IMAGING — CT CT ABD-PELV W/O CM
2 of 4 series · 15 of 46 positions shown, 17 images · non-contrast
Comparison: CT abdomen and pelvis of 10/05/2011.

CLINICAL DATA: Unable to urinate, history of Addison's disease and
seizures

CT ABDOMEN AND PELVIS WITHOUT CONTRAST
TECHNIQUE: Multidetector CT imaging of the abdomen and pelvis was
performed following the standard protocol without intravenous
contrast.

[Series 2: abd/pel w/o · axial · non-contrast · 0.92mm/px · z∈[-96,+369]mm · 12 of 103 slices shown, 14 images]
[im 5/103  soft-tissue]
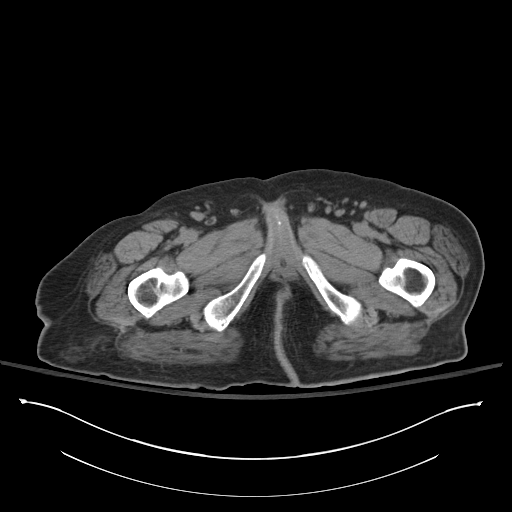
[im 5/103  bone]
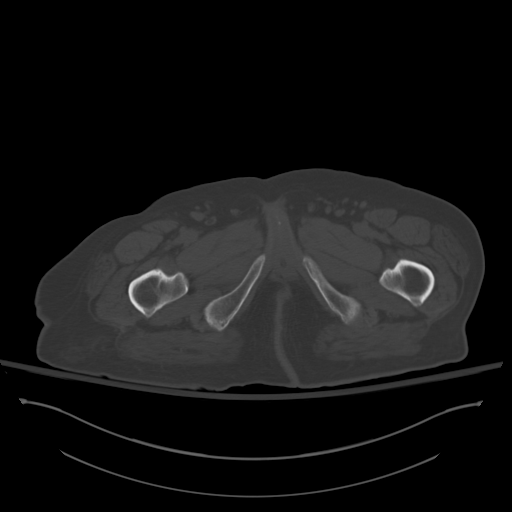
[im 14/103  soft-tissue]
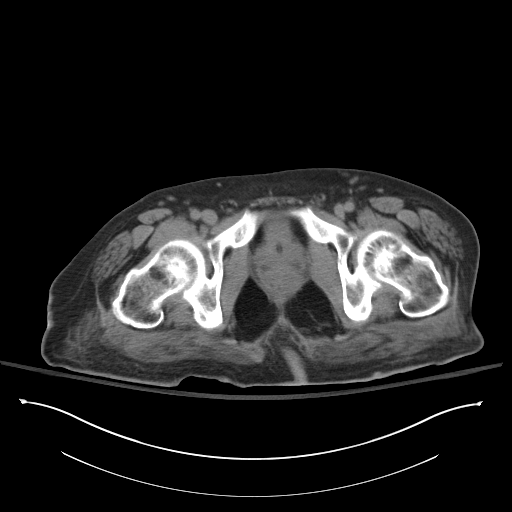
[im 24/103  soft-tissue]
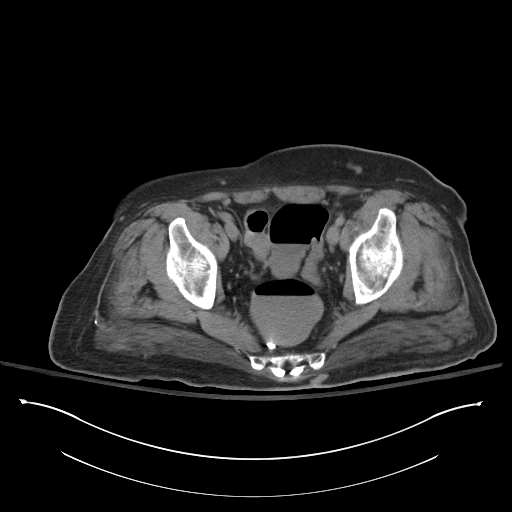
[im 33/103  soft-tissue]
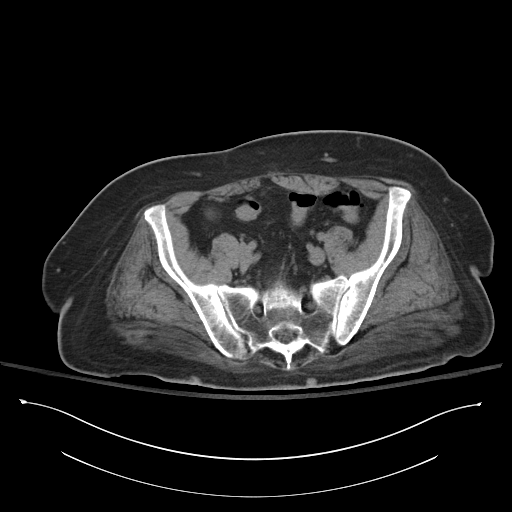
[im 38/103  soft-tissue]
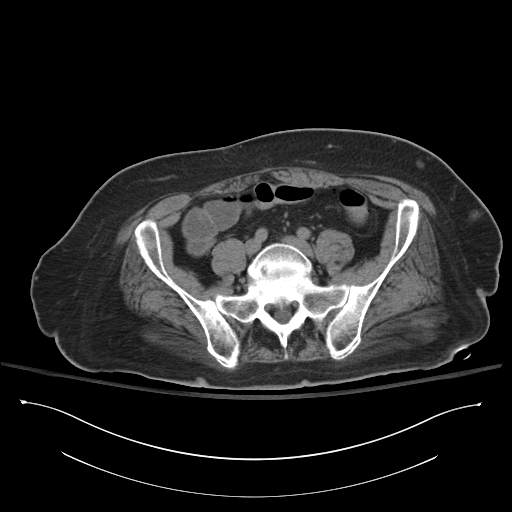
[im 47/103  soft-tissue]
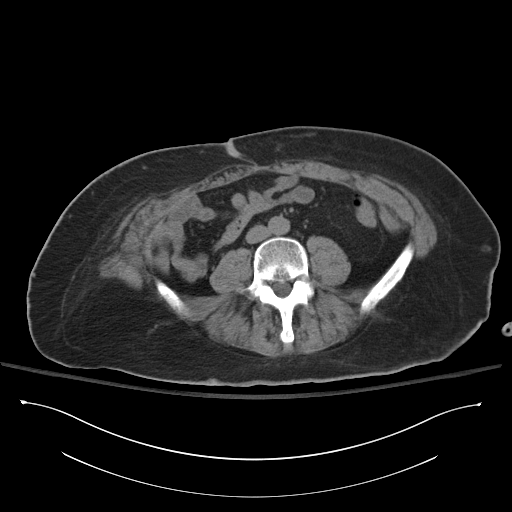
[im 56/103  soft-tissue]
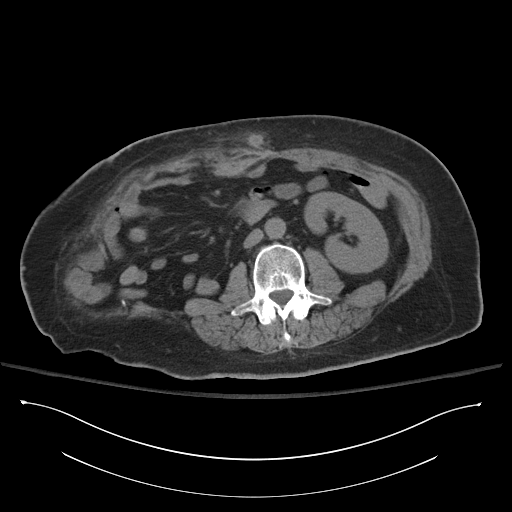
[im 65/103  soft-tissue]
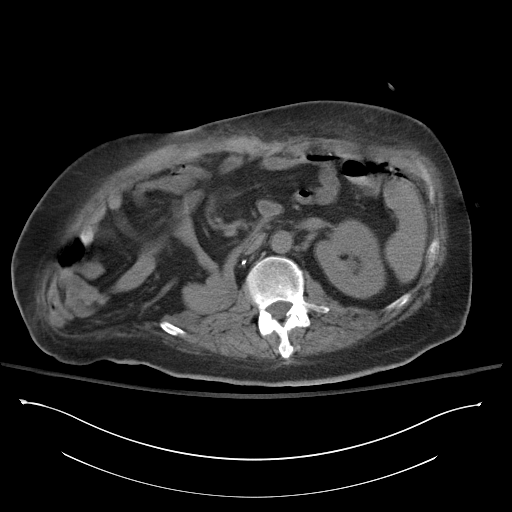
[im 70/103  soft-tissue]
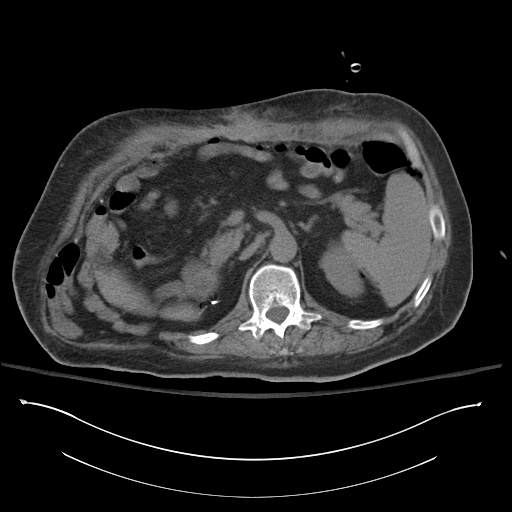
[im 70/103  bone]
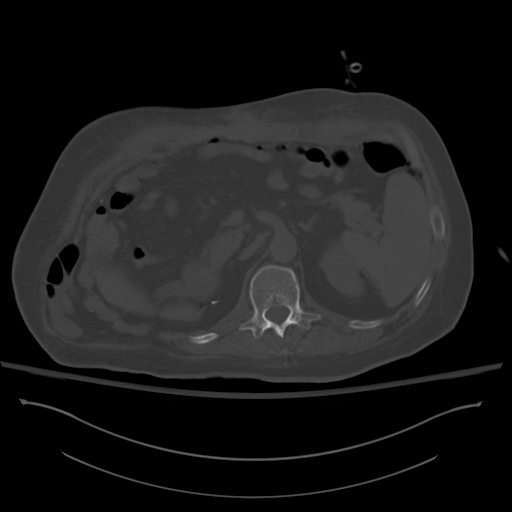
[im 79/103  soft-tissue]
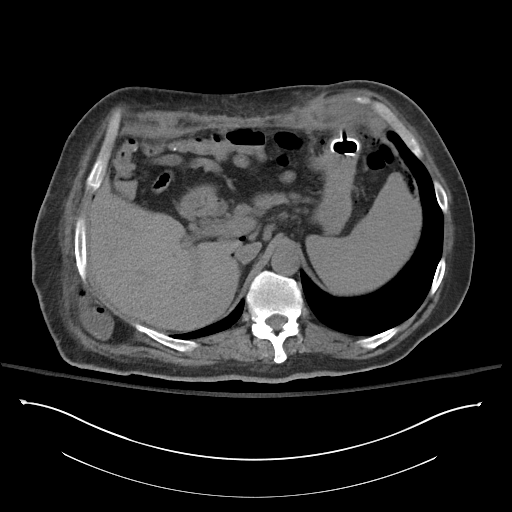
[im 89/103  soft-tissue]
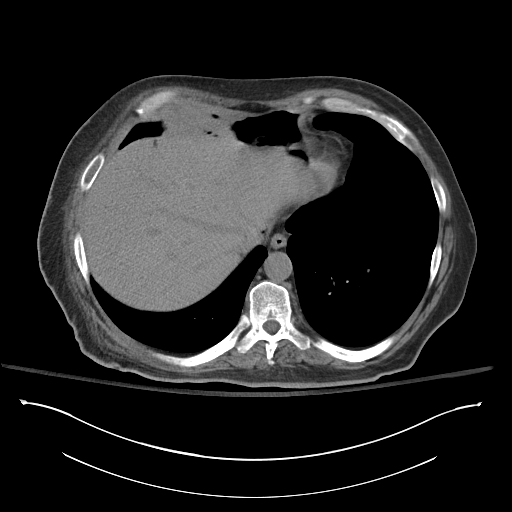
[im 98/103  soft-tissue]
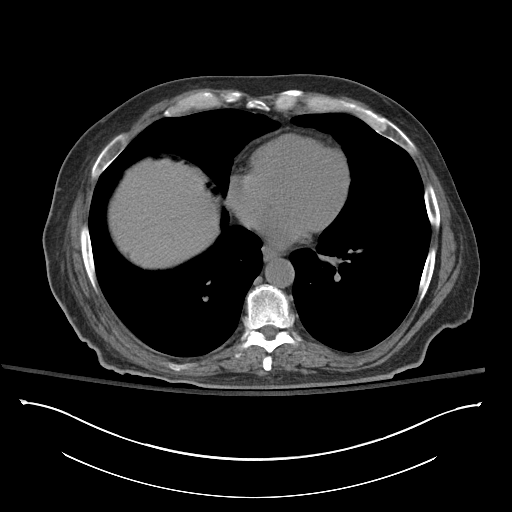

[Series 5: coronal · coronal · 0.74mm/px · 3 of 51 slices shown]
[im 17/51  soft-tissue]
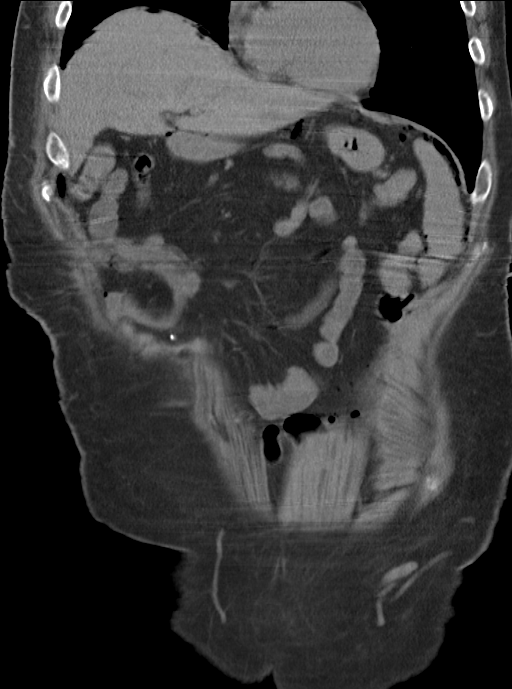
[im 23/51  soft-tissue]
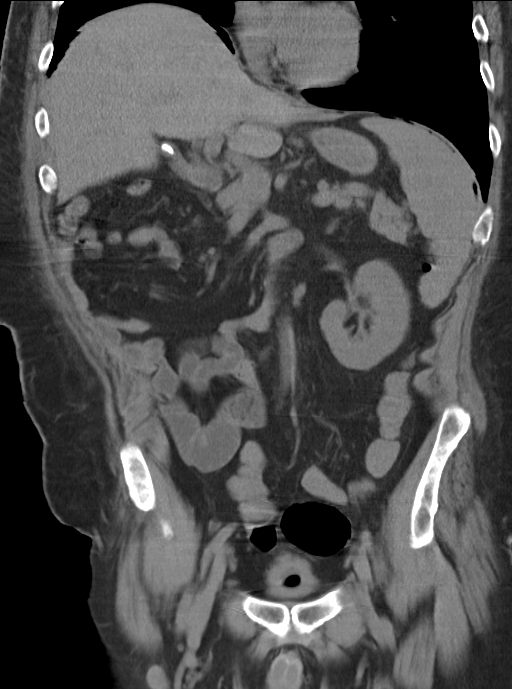
[im 28/51  soft-tissue]
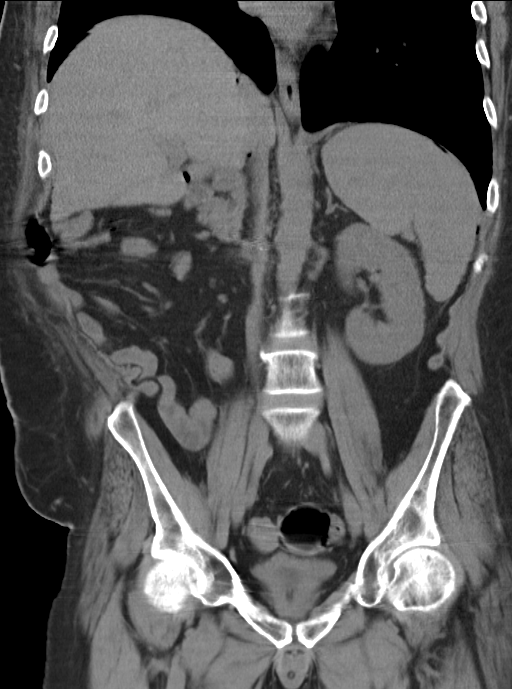

[15 of 46 positions shown; findings below may reference images not displayed]

FINDINGS: The lung bases are clear.  However, well seen on the
lung window images there is a significant amount of free
intraperitoneal air present there is a gastrostomy tube within the
left upper quadrant which appears to be within the body of the
stomach.  This intraperitoneal air could be due to the gastrostomy
if there was recent placement.  However there is air scattered
throughout the peritoneal cavity and perforated viscus cannot be
excluded.  There are prominent soft tissues surrounding the
entrance site of the gastrostomy tube which may indicate local
cellulitis.

The liver is unremarkable.  Surgical clips are present from prior
cholecystectomy.  The pancreas is normal in size and the pancreatic
duct is not dilated.  The adrenal glands and spleen are
unremarkable.  The right kidney has been surgically resected
previously with multiple clips in the right retroperitoneum.  The
left kidney is unremarkable in the unenhanced state with no
calculi, mass, or hydronephrosis noted.  The abdominal aorta is
normal in caliber.  There is some laxity of the right
posterolateral abdominal wall probably due to prior surgery for
right nephrectomy.  The urinary bladder is decompressed with Foley
catheter.  There is an unusual linear air shadow within the rectum
of questionable significance.  I discussed this finding with Dr.
Mevrick Sung in the [HOSPITAL] [HOSPITAL] [DATE] p.m. on
12/21/2011.  A foreign body cannot be excluded.  The prostate is
normal in size.

There is a 7 mm anterolisthesis of L5-1 as well with degenerative
disc disease at L5-L1.  Bilateral pars defects are present at L5.
No acute bony abnormality is seen.
IMPRESSION: 1.  Free intraperitoneal air.  This may be due to the gastrostomy
tube, but perforated bowel cannot be excluded.
2. Cannot exclude foreign body in the rectum.  Correlate
clinically.
3.  Anterolisthesis of L5 on S1 with bilateral pars defects.

## 2012-01-28 IMAGING — CT CT ABD-PELV W/O CM
2 of 4 series · 15 of 46 positions shown, 17 images · non-contrast
Comparison: 12/21/2011

CLINICAL DATA: Hematuria

CT ABDOMEN AND PELVIS WITHOUT CONTRAST
TECHNIQUE: Multidetector CT imaging of the abdomen and pelvis was
performed following the standard protocol without intravenous
contrast.

[Series 2: abd/pel w/o · axial · non-contrast · 0.85mm/px · z∈[+1090,+1556]mm · 12 of 103 slices shown, 14 images]
[im 5/103  soft-tissue]
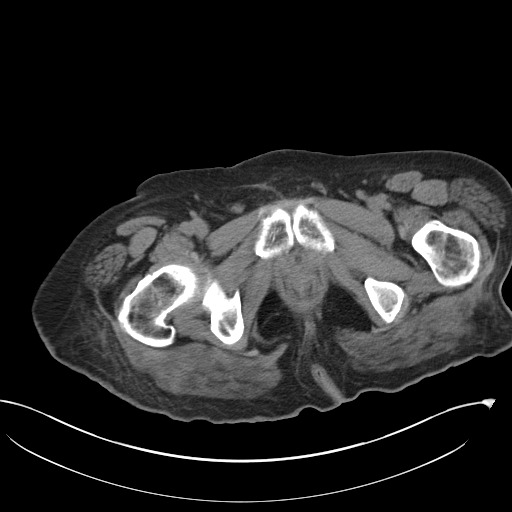
[im 5/103  bone]
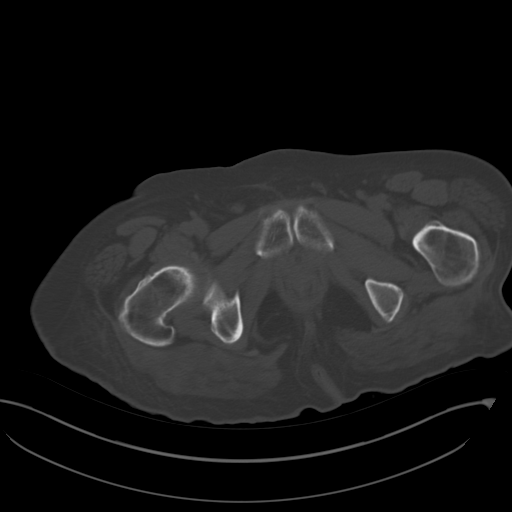
[im 15/103  soft-tissue]
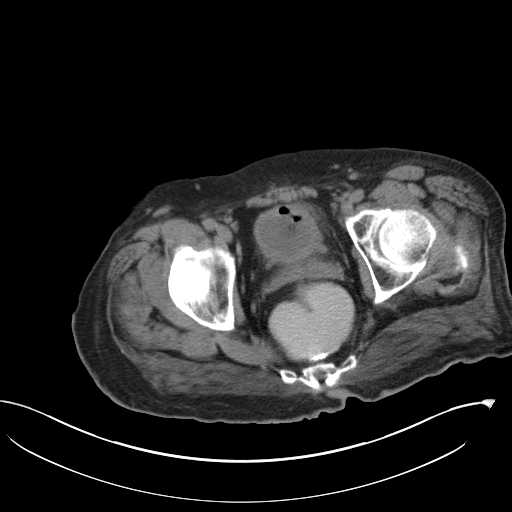
[im 25/103  soft-tissue]
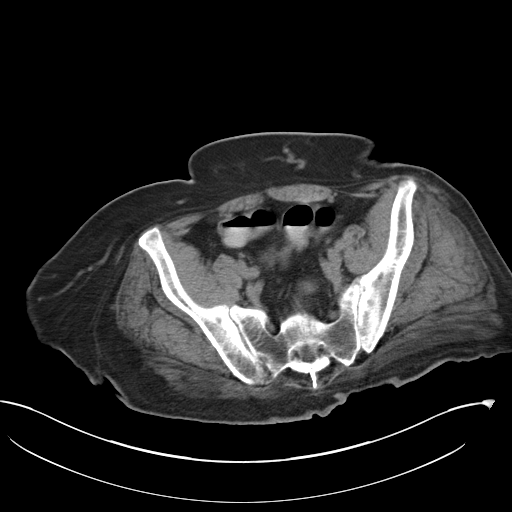
[im 30/103  soft-tissue]
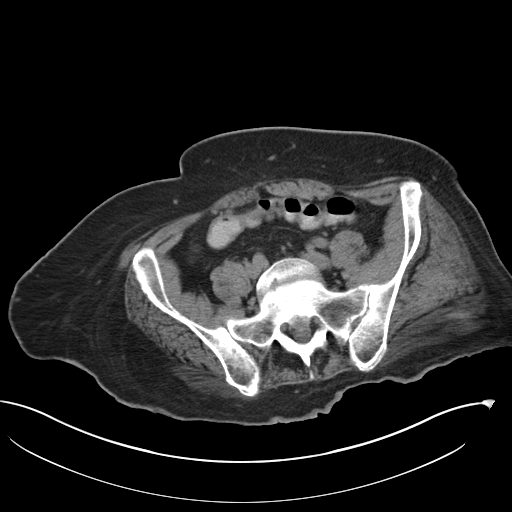
[im 39/103  soft-tissue]
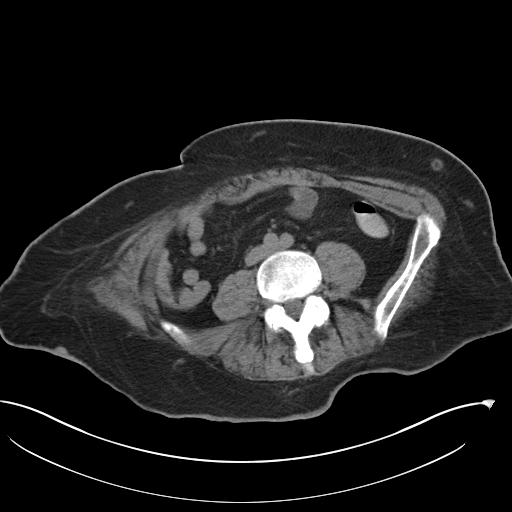
[im 49/103  soft-tissue]
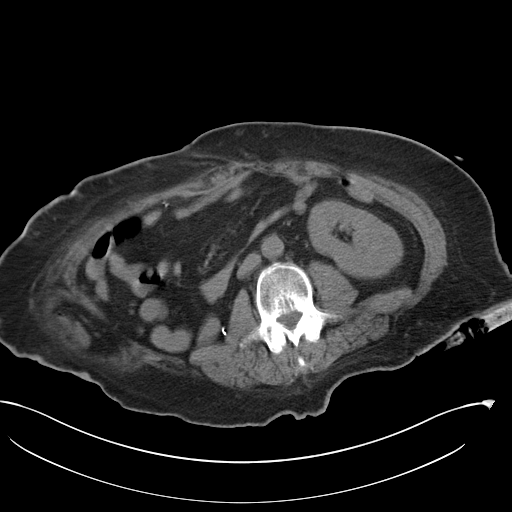
[im 54/103  soft-tissue]
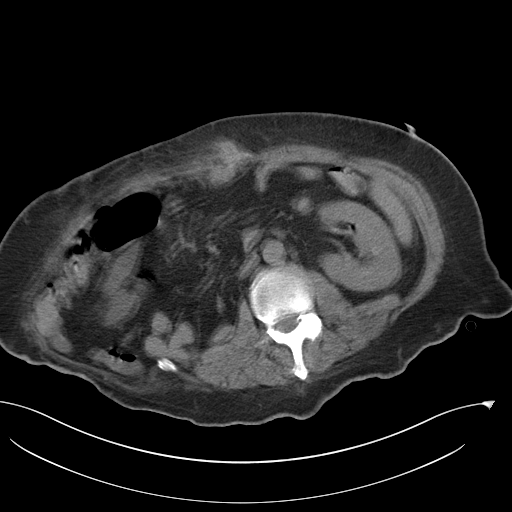
[im 64/103  soft-tissue]
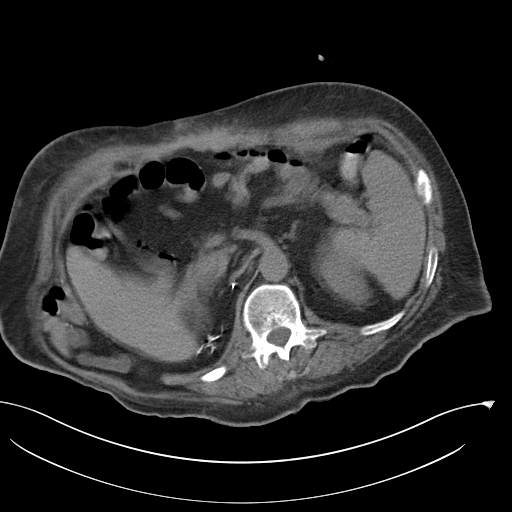
[im 73/103  soft-tissue]
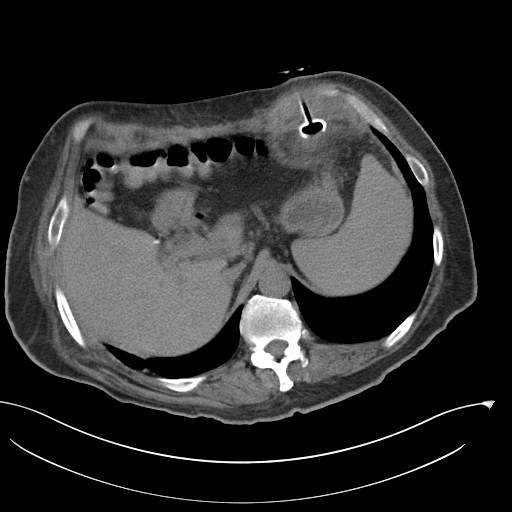
[im 73/103  bone]
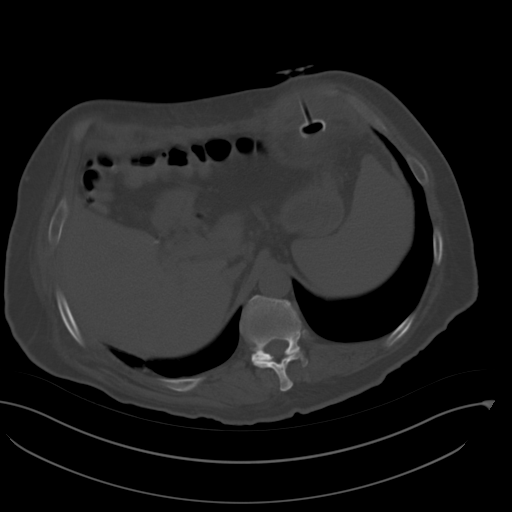
[im 78/103  soft-tissue]
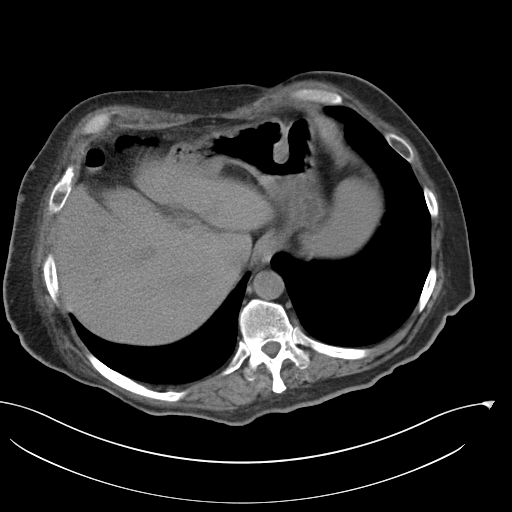
[im 88/103  soft-tissue]
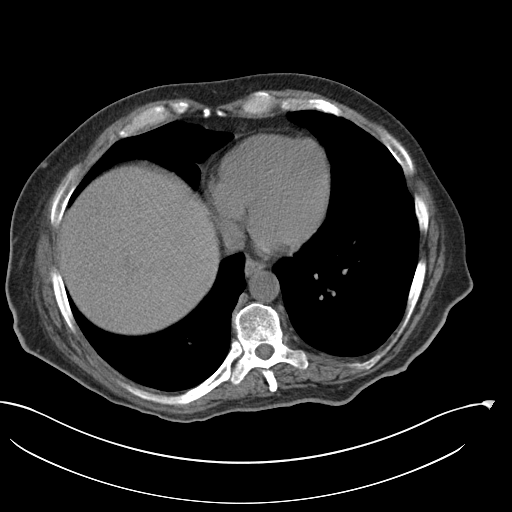
[im 98/103  soft-tissue]
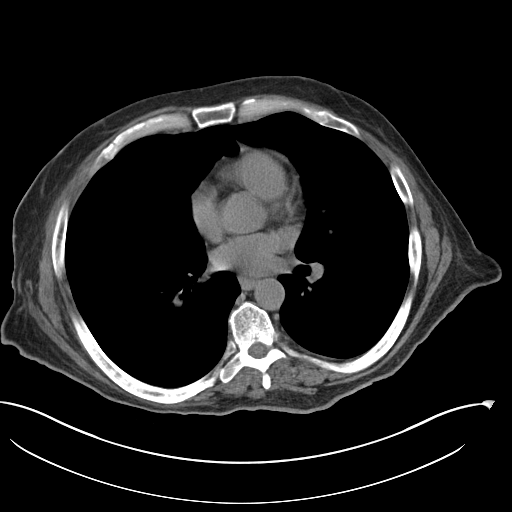

[Series 5: coronal · coronal · 0.86mm/px · 3 of 55 slices shown]
[im 19/55  soft-tissue]
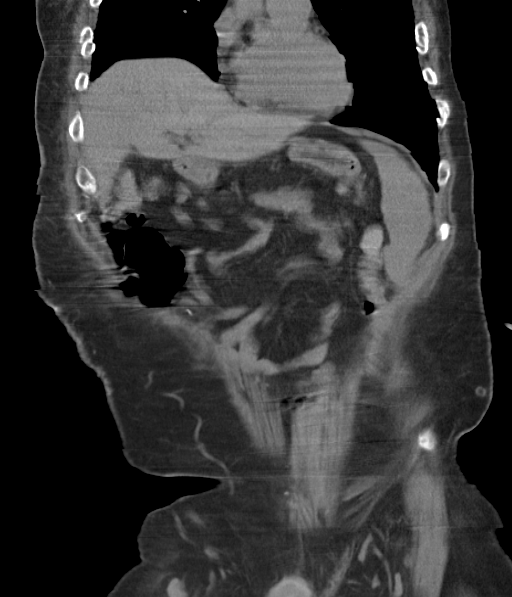
[im 25/55  soft-tissue]
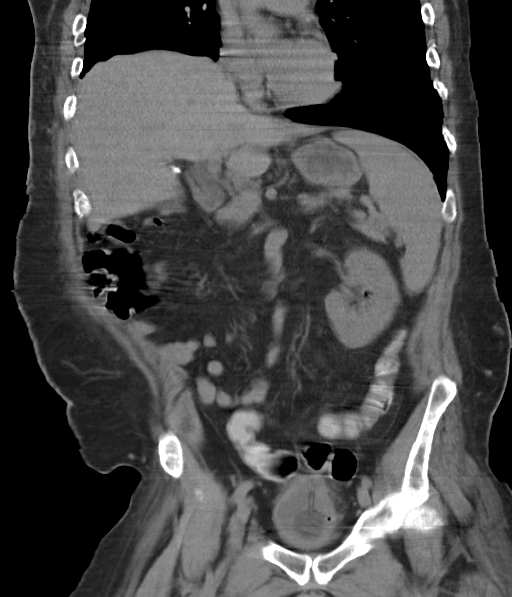
[im 31/55  soft-tissue]
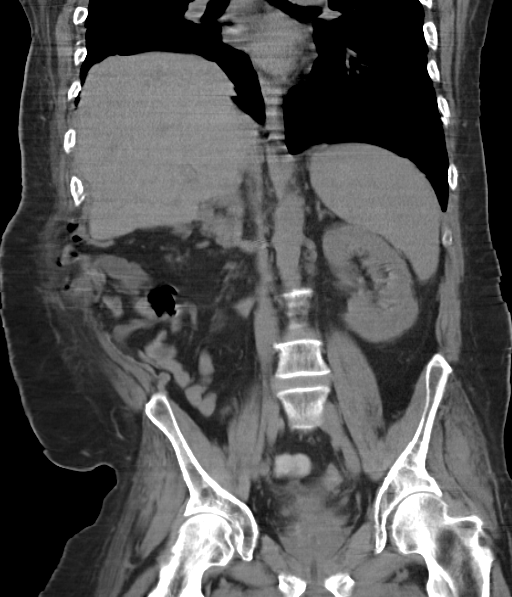

[15 of 46 positions shown; findings below may reference images not displayed]

FINDINGS: The lung bases are unremarkable.  Sagittal images of the spine
shows again pars defect at L5 level.  There is mild about 7mm
anterior subluxation L5 on S1 vertebral body.  Post cholecystectomy
surgical clips are noted.  There is significant decrease of
previous free abdominal air.  Only tiny amount of residual free air
noted in axial image 28 to in the region of the falciform ligament
measures about 4.3 mm.  Tiny residual free air noted just left
lateral to the stomach measures about 4.3 mm.

Again noted prominent soft tissue surrounding the entrance of the
gastrostomy tube.  This is best seen in axial image 33.  There is
also some stranding of the subcutaneous fat at this level.  Again
findings may be due to local inflammation or cellulitis.  Clinical
correlation is necessary.

Post cholecystectomy surgical clips.  Surgical clips in the right
retroperitoneum again noted. Stable laxity of the right
posterolateral abdominal wall.

Unenhanced left kidney shows no nephrolithiasis.  Small amount of
intrarenal calyceal air is noted in the left kidney this is
probable post recent instrumentation.  No hydronephrosis or
hydroureter.

There is a Foley catheter within urinary bladder.  The urinary
bladder is decompressed limiting its assessment.  Small amount of
air within anterior aspect of the bladder is probable post
instrumentation.

No left ureteral calculi are noted.  Nonspecific mild thickening of
urinary bladder wall.  No destructive bony lesions are noted within
pelvis.  No small bowel or colonic obstruction.  High density
material dependent portion of the rectum presacral region is
stable.
IMPRESSION: 1.  There is  significant decrease in size of previous free intra-
abdominal air. Only tiny amount of free air is noted as described
above.
2.  Again noted soft tissue prominence surrounding the entrance of
the gastrostomy tube suspicious for local inflammation or
cellulitis.
3.  Status post cholecystectomy.  Status post right nephrectomy
again noted.  Stable laxity of the right posterolateral abdominal
wall.
4.  There is a small amount of the left intrarenal air probable
post recent instrumentation.  No left hydronephrosis.  No left
nephrolithiasis.  No left ureteral calcifications.
5.  There is a Foley catheter with[REDACTED]ompressed urinary bladder.
Nonspecific mild thickening of urinary bladder wall.  Small amount
of air within bladder is probable post recent instrumentation.

## 2012-01-28 IMAGING — CR DG ABDOMEN ACUTE W/ 1V CHEST
4 series · 4 of 4 positions shown · non-contrast
Comparison: CT abdomen pelvis 12/21/2011

CLINICAL DATA: Abdominal pain.  History of abdominal surgery.

ACUTE ABDOMEN SERIES (ABDOMEN 2 VIEW & CHEST 1 VIEW)

[x chest ap]
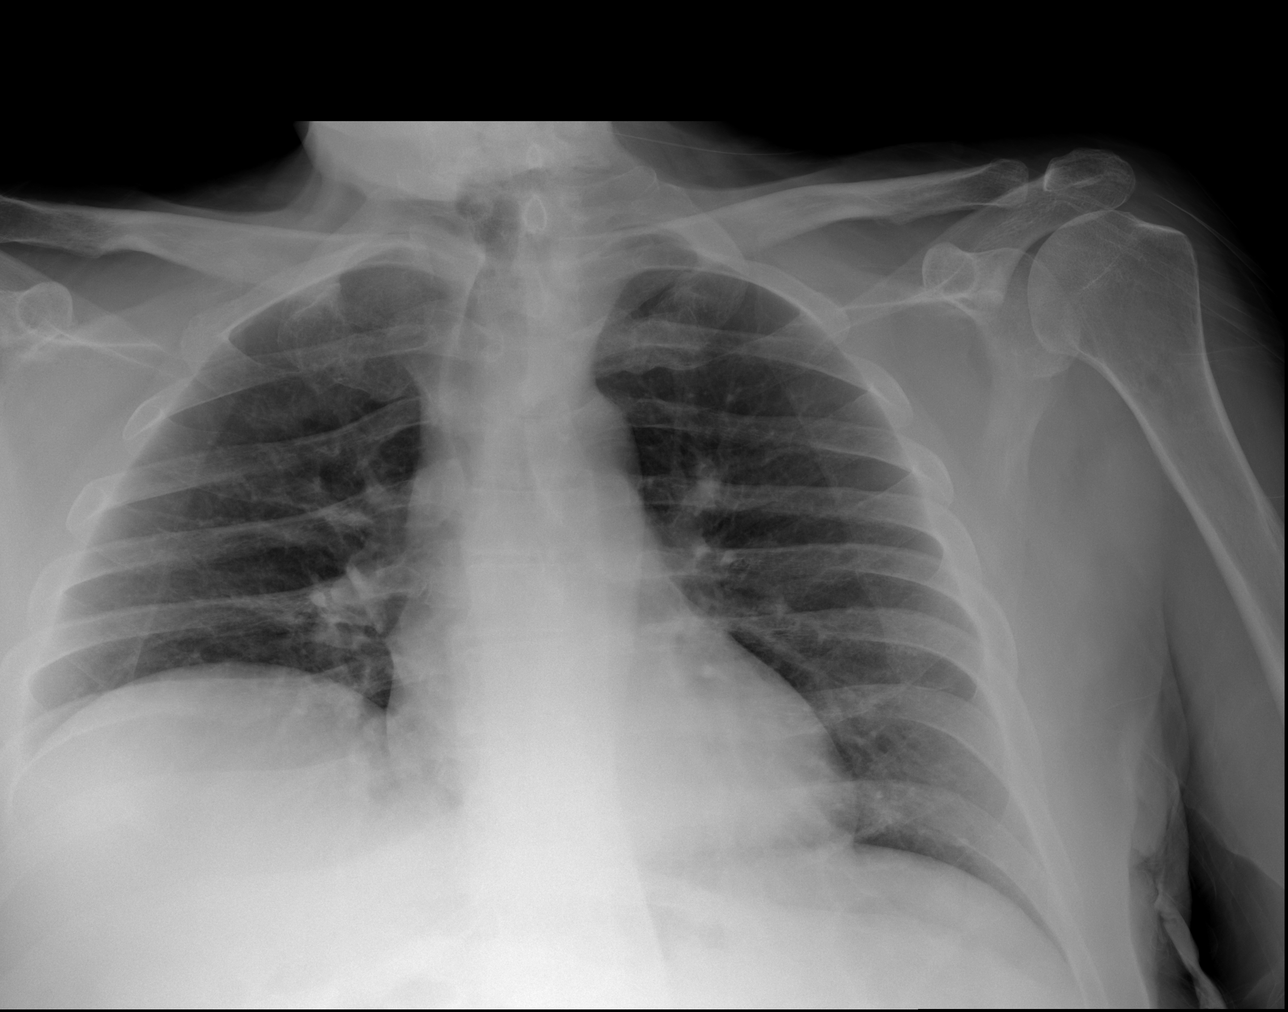

[w abdomen decub (1 of 2)]
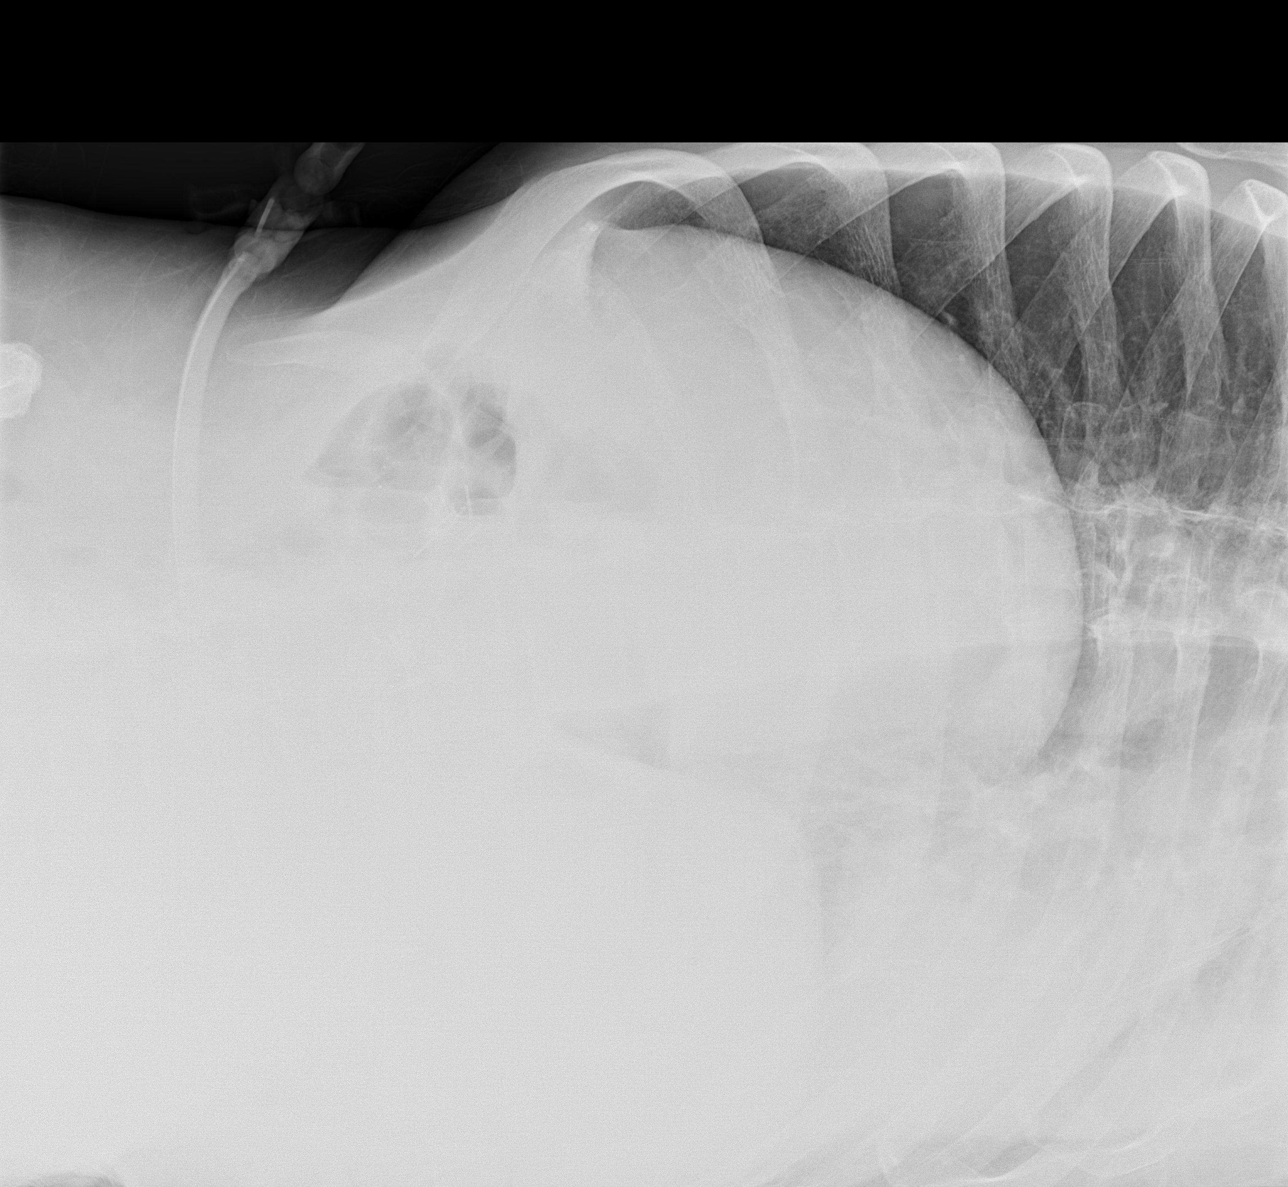

[w abdomen decub (2 of 2)]
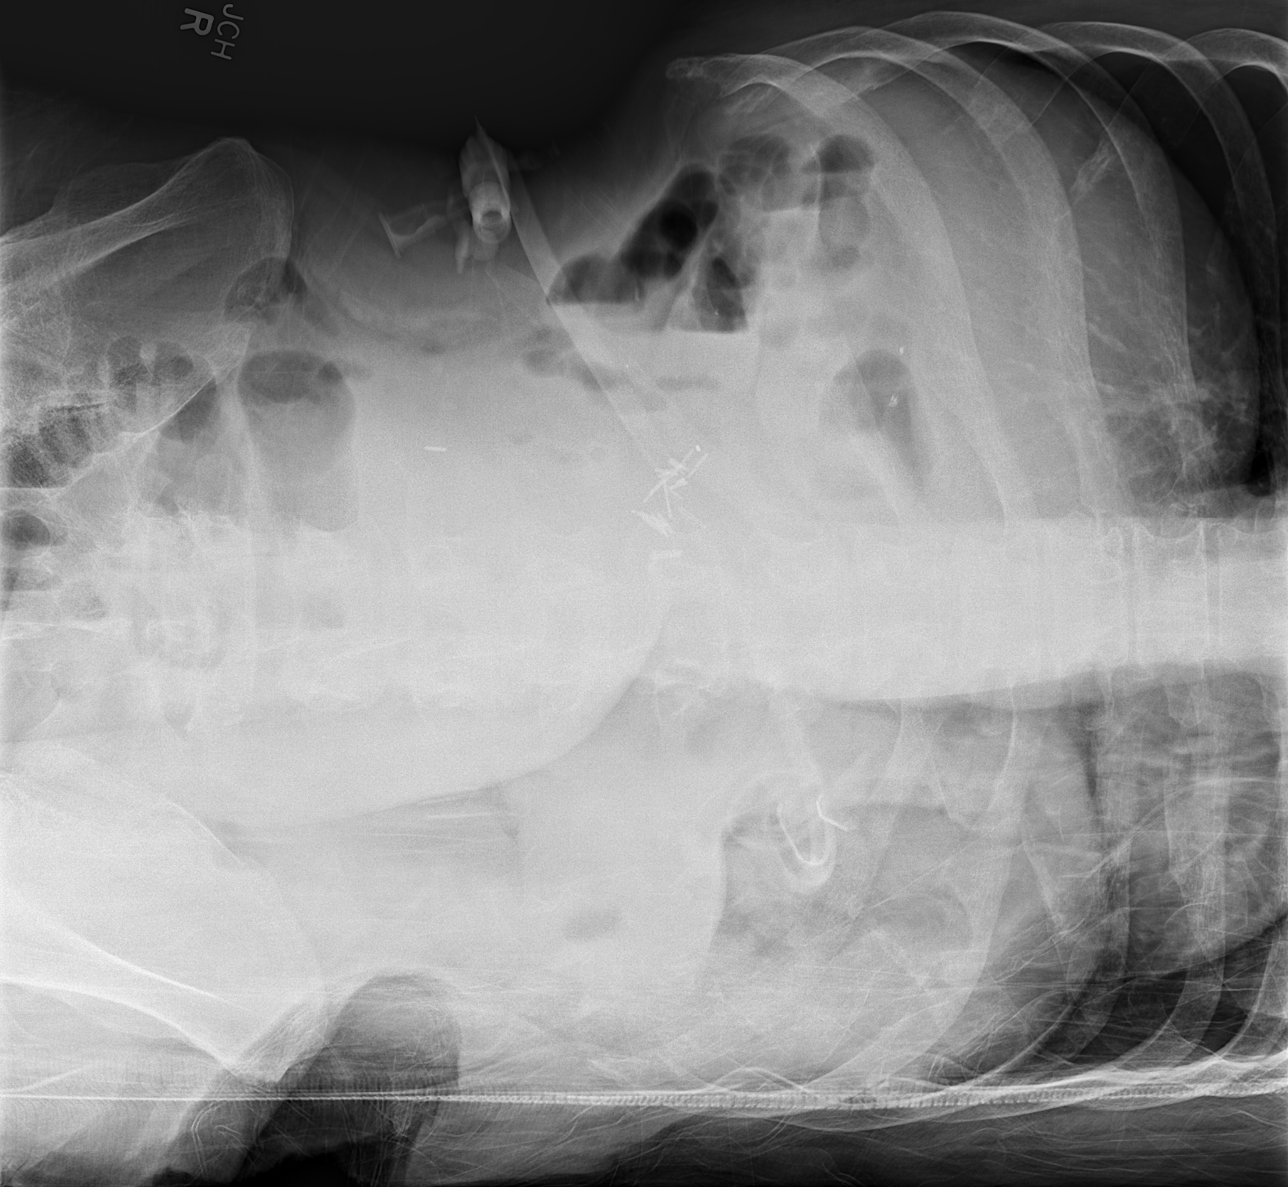

[x abdomen supine]
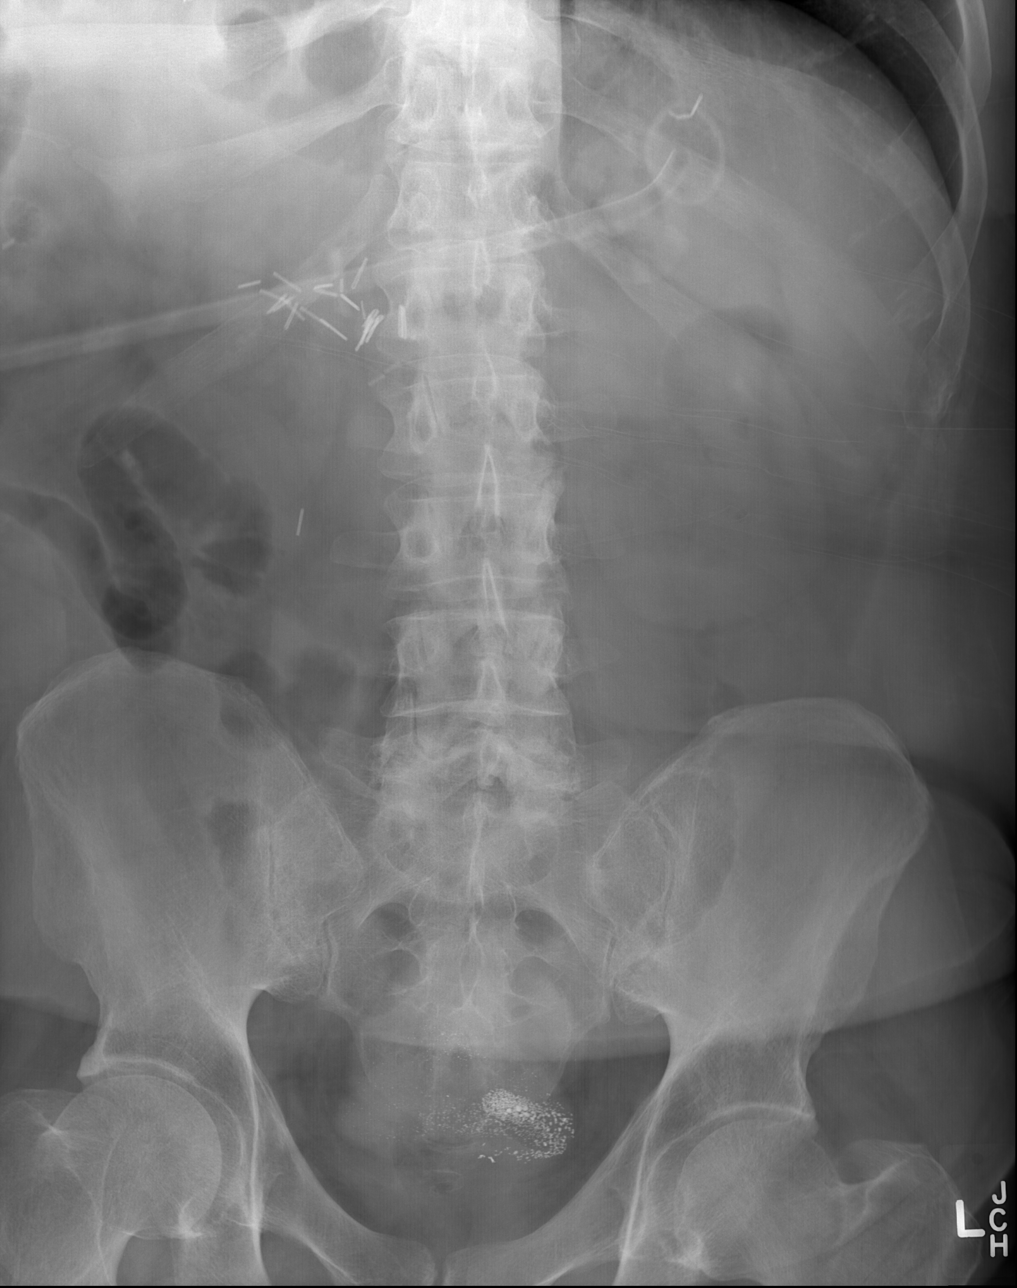

[4 of 4 positions shown; findings below may reference images not displayed]

FINDINGS: Lung volumes are low.  Both lungs are clear.  The heart
mediastinal contours are within normal limits.

No free intraperitoneal air is identified.  Gastrostomy tube
projects over the left upper quadrant.  There are surgical clips in
the right abdomen.  Multiple tiny locules of contrast material
project over the lower pelvis.  The bowel gas pattern is
nonobstructive.
IMPRESSION: 1.  Nonobstructive bowel gas pattern.
2.  Gastrostomy tube present.
3.  No evidence of free intraperitoneal air.

## 2012-02-06 IMAGING — CR DG ABDOMEN 1V
2 series · 2 of 2 positions shown · non-contrast
Comparison: Abdomen pelvis CT 12/26/2011

CLINICAL DATA: Peg tube and bleeding.

ABDOMEN - 1 VIEW

[x abdomen supine (1 of 2)]
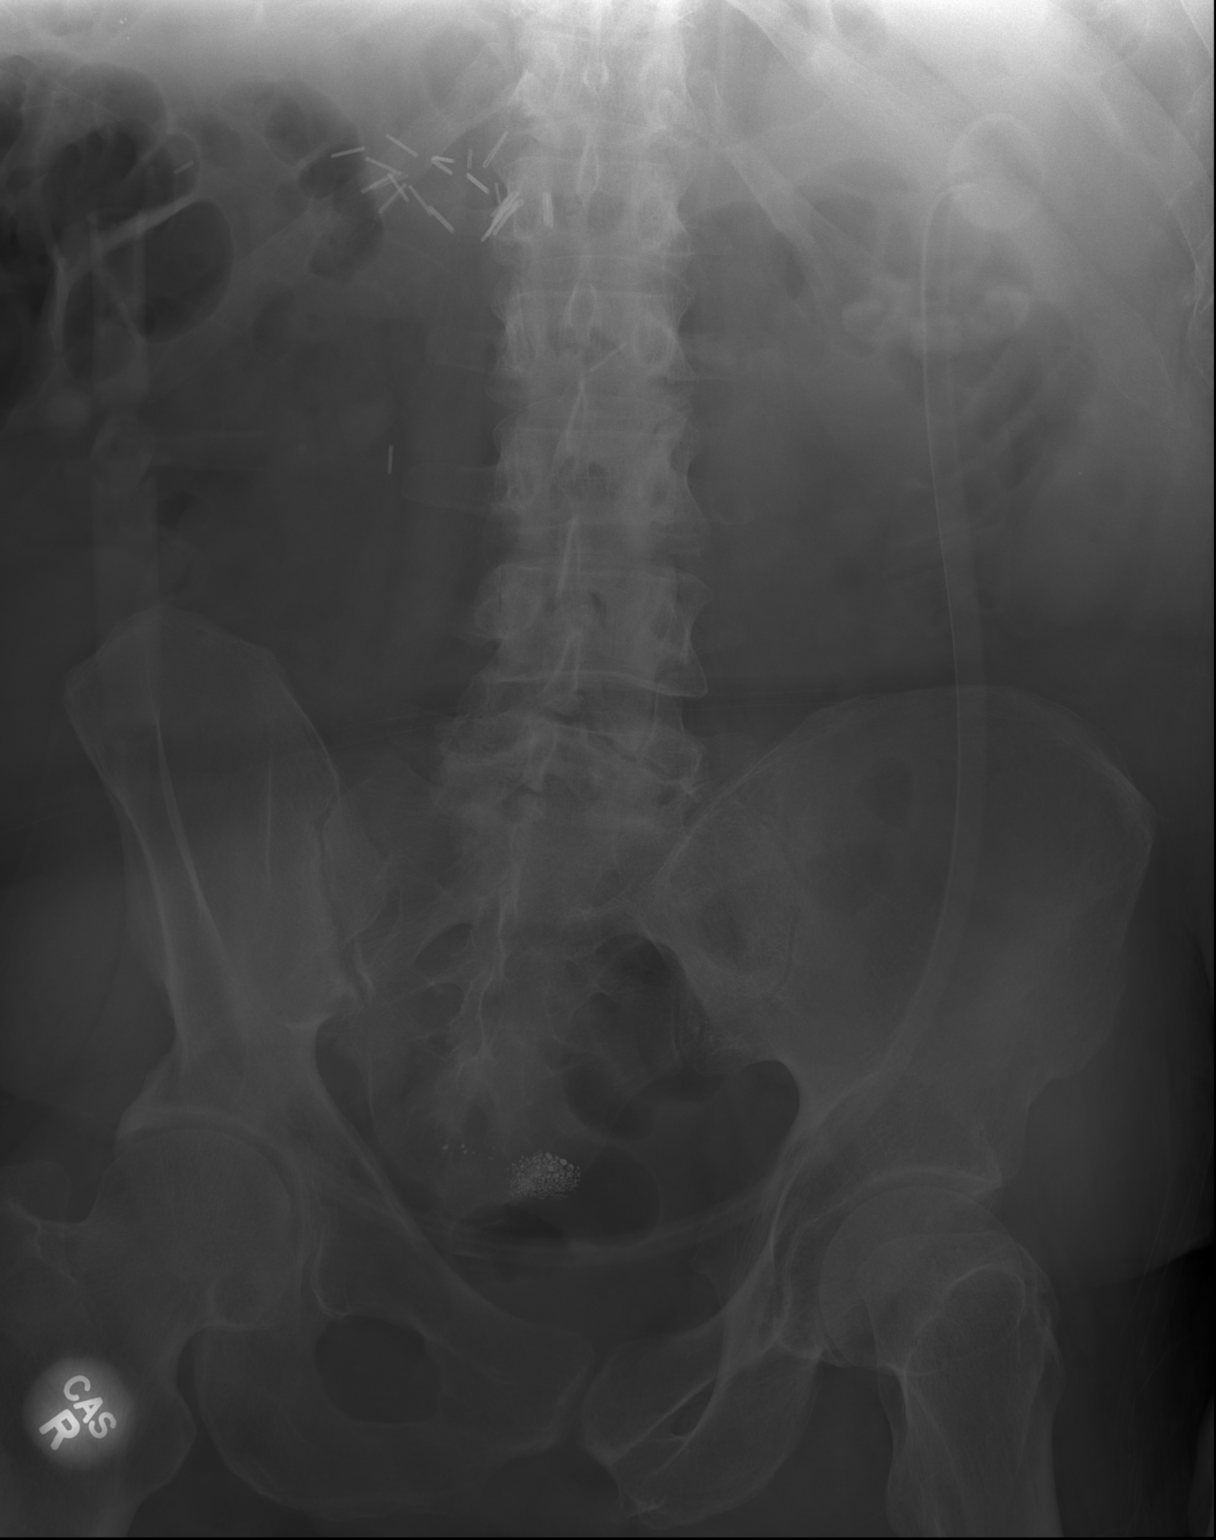

[x abdomen supine (2 of 2)]
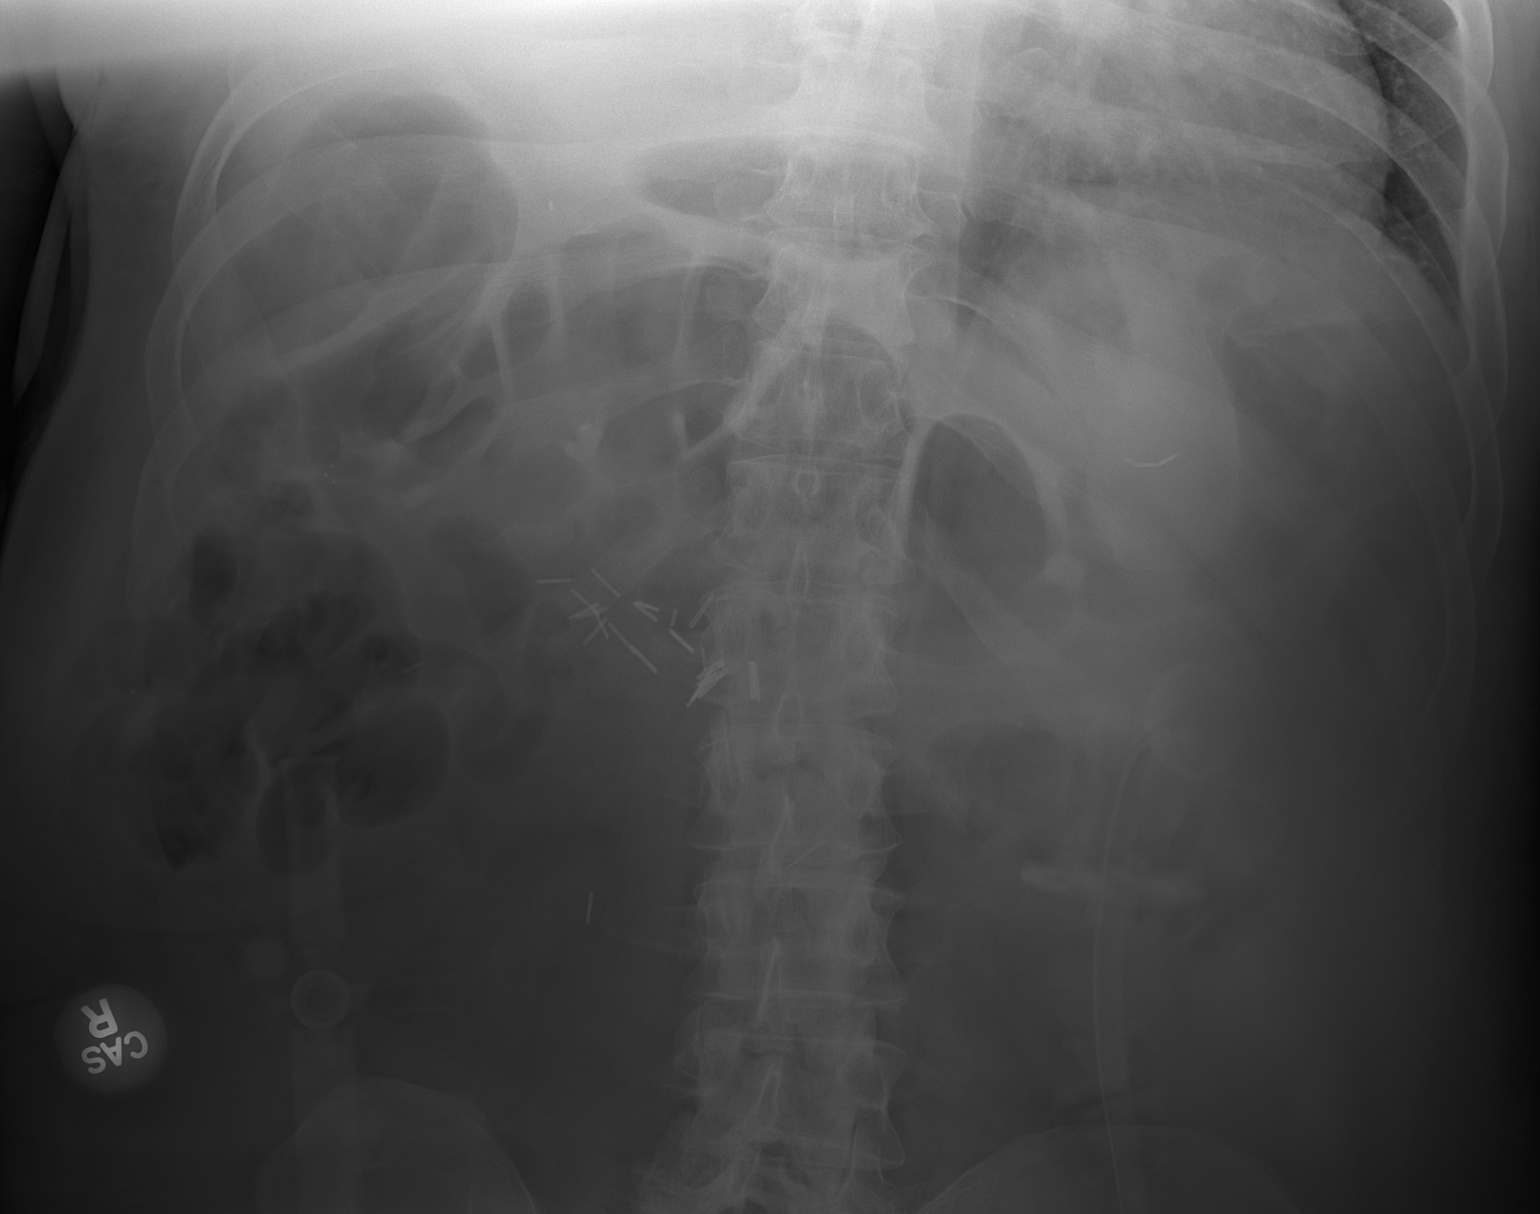

[2 of 2 positions shown; findings below may reference images not displayed]

FINDINGS: A percutaneous gastrostomy tube projects over the left
upper quadrant .  Bowel gas pattern is nonobstructive.  Tiny
rounded areas of high density material projects over the lower
pelvis, in the expected location of the rectum, unchanged from
prior CT.  Surgical clips effect over the right upper abdomen.
IMPRESSION: Nonobstructive bowel gas pattern.  Gastrostomy tube projects over
the left upper quadrant.

## 2012-02-07 IMAGING — CR DG ABDOMEN 1V
2 series · 2 of 2 positions shown · non-contrast
Comparison: 01/04/2012

CLINICAL DATA: Verify g-tube placement

ABDOMEN - 1 VIEW

[x abdomen supine (1 of 2)]
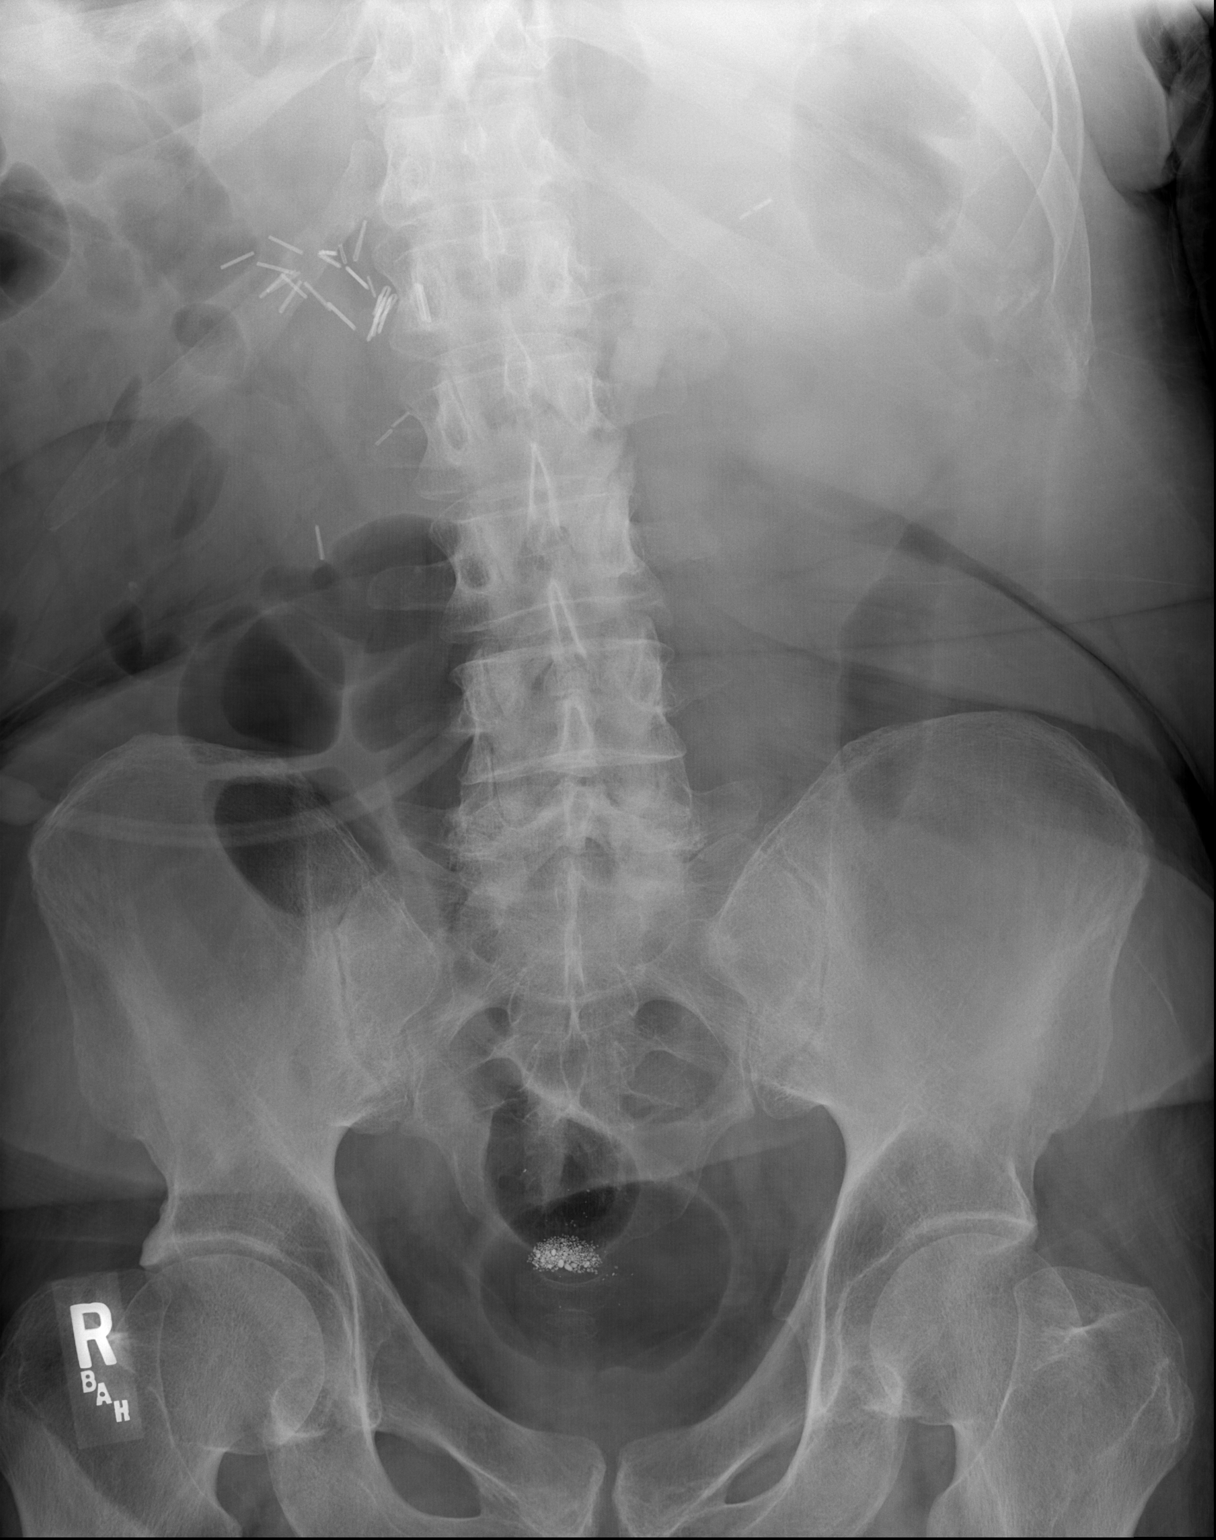

[x abdomen supine (2 of 2)]
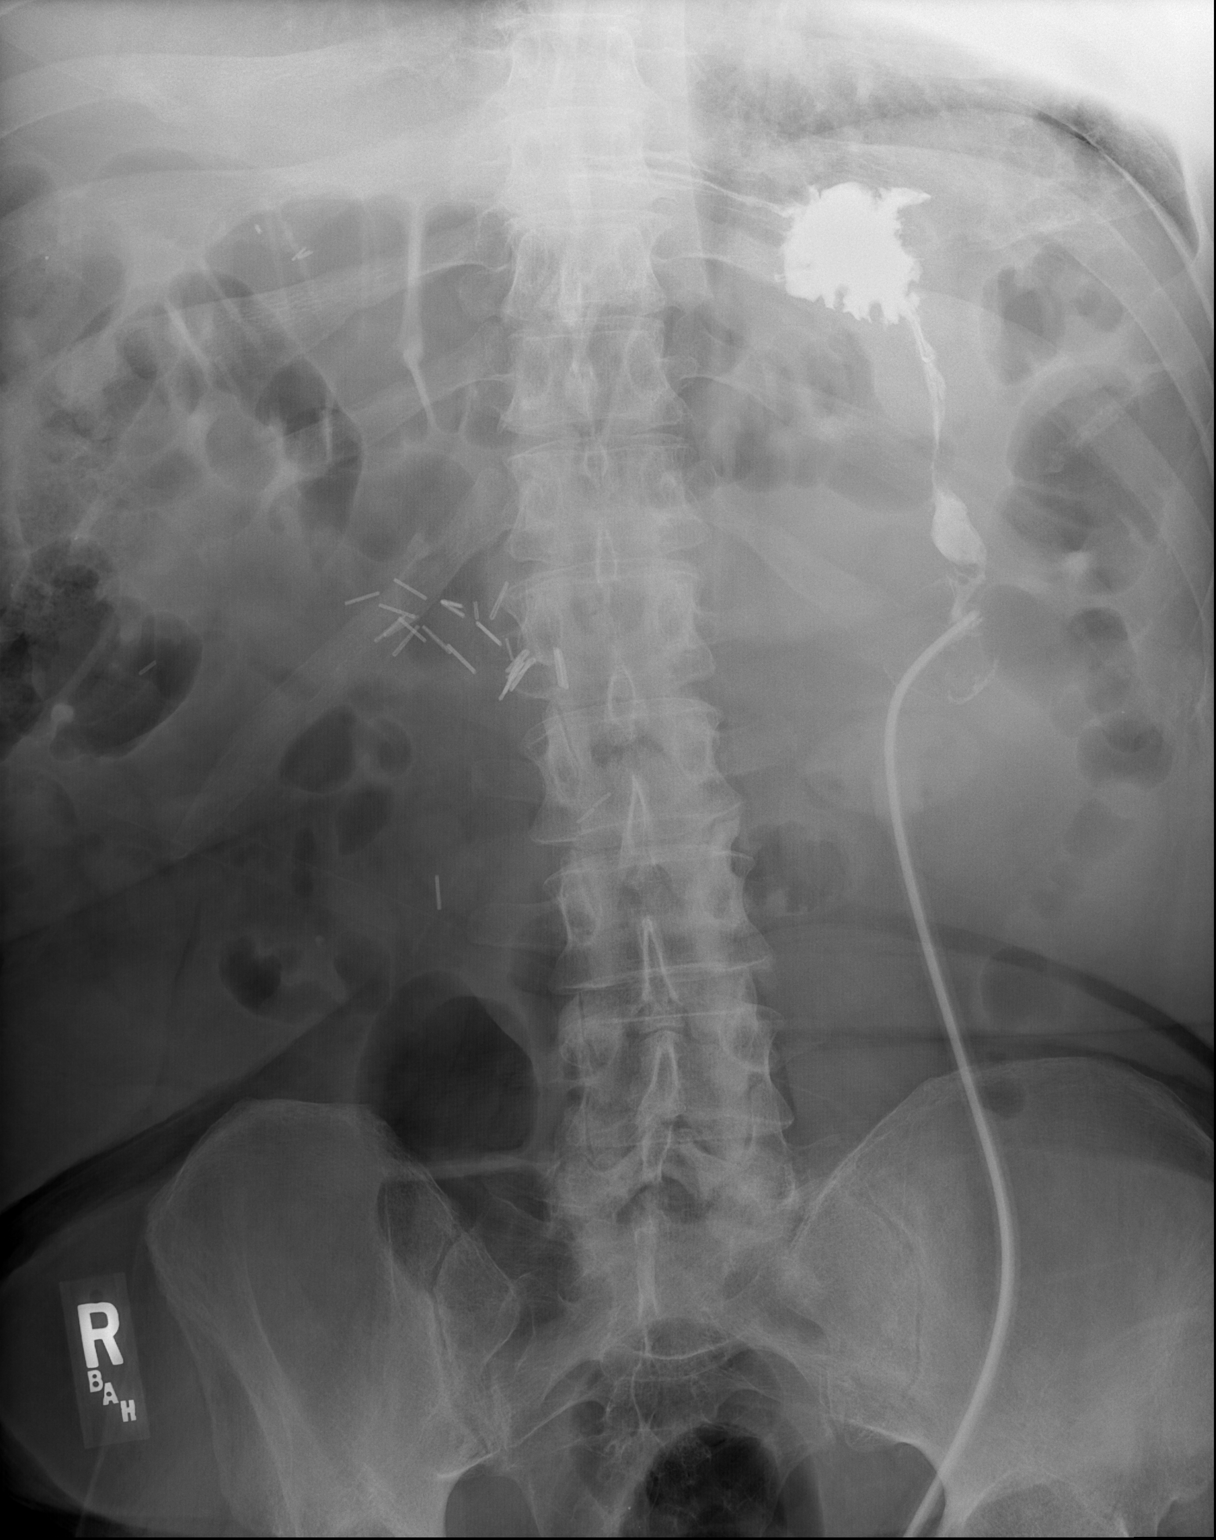

[2 of 2 positions shown; findings below may reference images not displayed]

FINDINGS: 40 ml 6mnipaque-488 contrast was administered via
indwelling gastrostomy tube.

Contrast opacifies the stomach, confirming intraluminal placement.
No extravasation of contrast is seen.

Surgical clips in the right abdomen.
IMPRESSION: Contrast opacifies the stomach, confirming intraluminal placement
of indwelling gastrostomy tube.

## 2012-02-12 IMAGING — CR DG ABD PORTABLE 1V
1 series · 1 of 1 positions shown · non-contrast
Comparison: Abdominal radiograph performed earlier today at [DATE]
a.m.

CLINICAL DATA: PEG tube repositioning.

PORTABLE ABDOMEN - 1 VIEW

[ap (kub)]
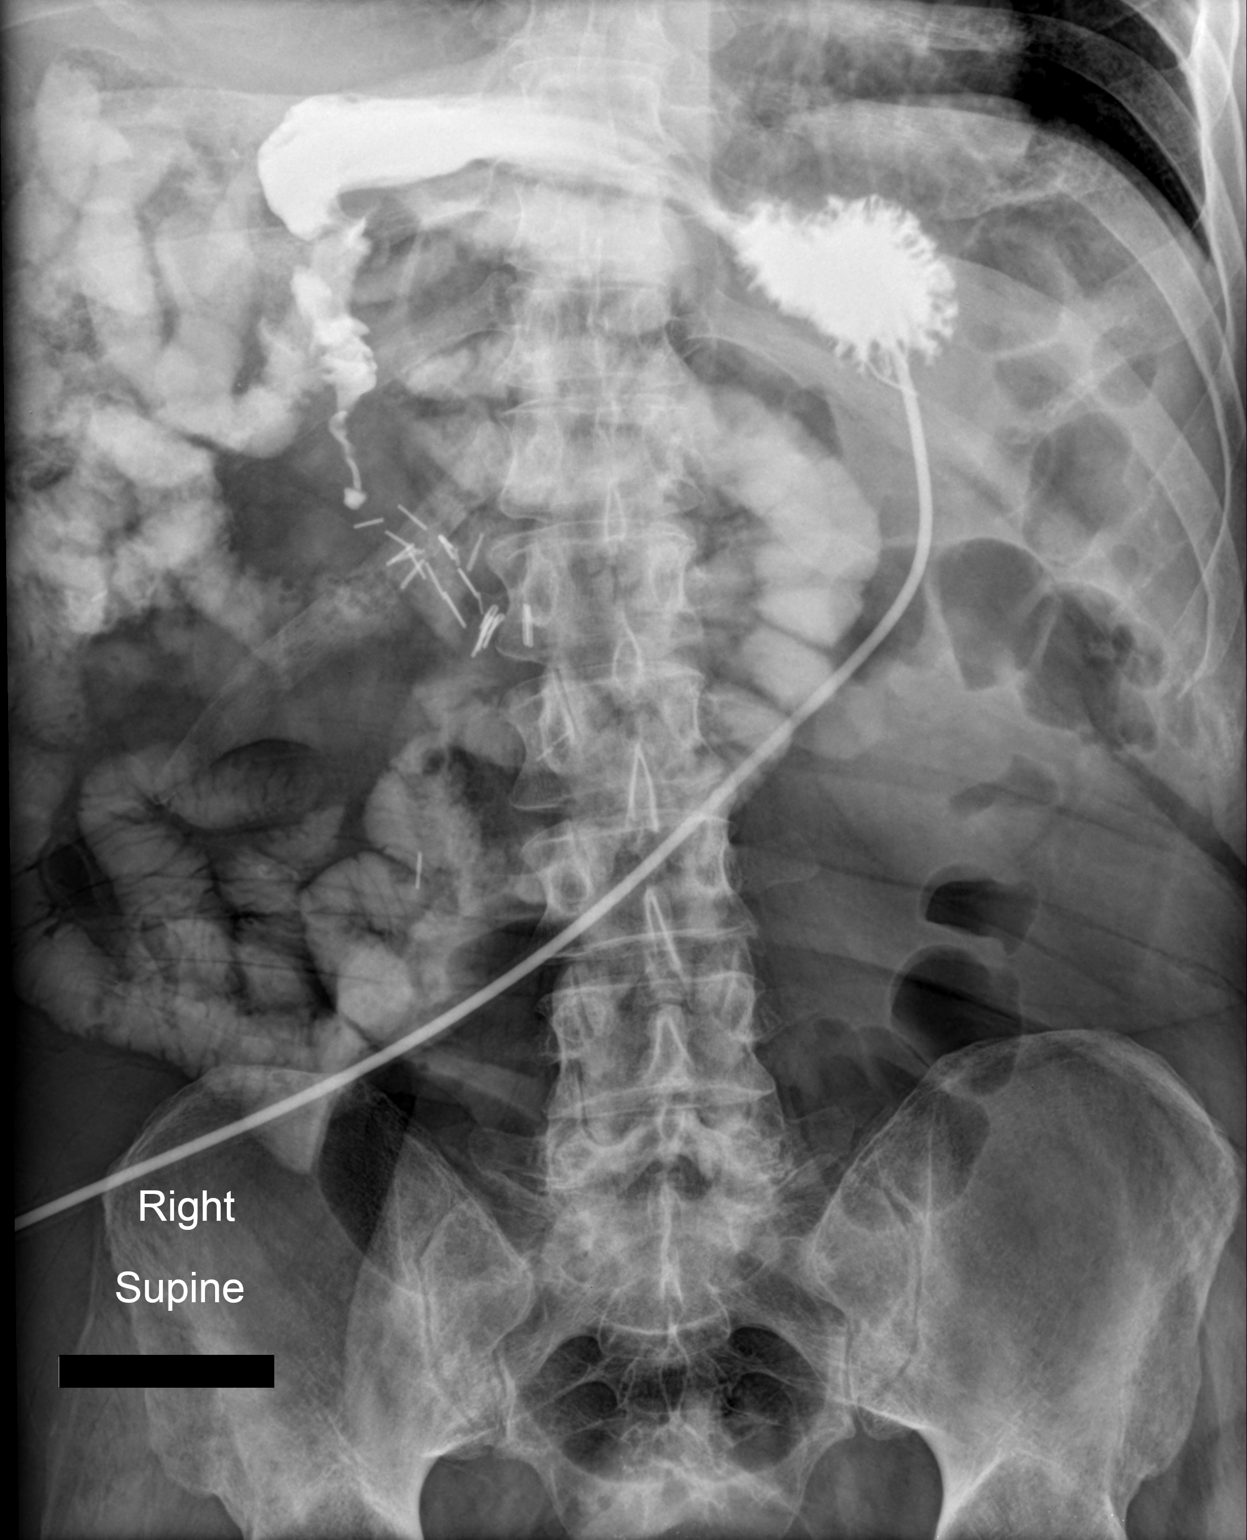

[1 of 1 positions shown; findings below may reference images not displayed]

FINDINGS: The patient's PEG tube is now seen ending at the body of
the stomach, in appropriate position; it has been advanced since
the prior study.

Injected contrast extends into the antrum of the stomach and into
the duodenum.  Contrast from the prior study is noted filling the
small bowel.  Scattered clips are noted within the midabdomen.  No
free intra-abdominal air is identified, though evaluation for free
air is limited on a single supine view.

No acute osseous abnormalities are seen.
IMPRESSION: PEG tube noted ending at the body of the stomach, in appropriate
position; it has been advanced since the prior study.  Injected
contrast noted filling the stomach.

## 2012-04-12 ENCOUNTER — Emergency Department (HOSPITAL_COMMUNITY)
Admission: EM | Admit: 2012-04-12 | Discharge: 2012-04-12 | Disposition: A | Discharge status: Home / Self Care | Payer: Medicare Other | Attending: Emergency Medicine | Admitting: Emergency Medicine

## 2012-04-12 ENCOUNTER — Encounter (HOSPITAL_COMMUNITY): Payer: Self-pay | Admitting: *Deleted

## 2012-04-12 ENCOUNTER — Emergency Department (HOSPITAL_COMMUNITY): Payer: Medicare Other

## 2012-04-12 DIAGNOSIS — G40909 Epilepsy, unspecified, not intractable, without status epilepticus: Secondary | ICD-10-CM | POA: Insufficient documentation

## 2012-04-12 DIAGNOSIS — I252 Old myocardial infarction: Secondary | ICD-10-CM | POA: Insufficient documentation

## 2012-04-12 DIAGNOSIS — E2749 Other adrenocortical insufficiency: Secondary | ICD-10-CM | POA: Insufficient documentation

## 2012-04-12 DIAGNOSIS — Z931 Gastrostomy status: Secondary | ICD-10-CM | POA: Insufficient documentation

## 2012-04-12 DIAGNOSIS — F411 Generalized anxiety disorder: Secondary | ICD-10-CM | POA: Insufficient documentation

## 2012-04-12 DIAGNOSIS — Z4659 Encounter for fitting and adjustment of other gastrointestinal appliance and device: Secondary | ICD-10-CM

## 2012-04-12 DIAGNOSIS — E785 Hyperlipidemia, unspecified: Secondary | ICD-10-CM | POA: Insufficient documentation

## 2012-04-12 DIAGNOSIS — I4891 Unspecified atrial fibrillation: Secondary | ICD-10-CM | POA: Insufficient documentation

## 2012-04-12 DIAGNOSIS — Z139 Encounter for screening, unspecified: Secondary | ICD-10-CM | POA: Insufficient documentation

## 2012-04-12 DIAGNOSIS — G931 Anoxic brain damage, not elsewhere classified: Secondary | ICD-10-CM | POA: Insufficient documentation

## 2012-04-12 NOTE — ED Provider Notes (Signed)
History     CSN: [id number]  Arrival date & time 04/12/12  0310   First MD Initiated Contact with Patient 04/12/12 0435      Chief Complaint  Patient presents with  . PEG tube placement confirmation     (Consider location/radiation/quality/duration/timing/severity/associated sxs/prior treatment) HPI Comments: Patient sent from nursing home for confirmation of correct placement of G-tube.  No other concerns noted.  Level V caveat: Patient with hx anoxic brain injury, is not verbal and does not offer more history.    The history is provided by the EMS personnel and the nursing home.    Past Medical History  Diagnosis Date  . Addison disease   . Anoxic brain injury   . A-fib   . Seizure disorder   . Dysphagia   . History of recurrent UTIs   . Hyperlipemia   . Aspiration pneumonia   . Mental disorder   . Anxiety   . Reflux   . Dysphagia   . Glucocorticoid deficiency   . Atrial fibrillation   . Glaucoma   . Hypothyroidism   . Encephalopathy   . Quadriplegia   . Addison disease   . Myocardial infarction     Past Surgical History  Procedure Date  . Nephrectomy unknown  . Peg tube placement     History reviewed. No pertinent family history.  History  Substance Use Topics  . Smoking status: Never Smoker   . Smokeless tobacco: Never Used  . Alcohol Use: No      Review of Systems  Unable to perform ROS: Other  Pt nonverbal, hx anoxic brain injury  Allergies  Latex; Morphine and related; Penicillins; Pyridium; and Soy allergy  Home Medications   Current Outpatient Rx  Name Route Sig Dispense Refill  . CITALOPRAM HYDROBROMIDE 20 MG PO TABS PEG Tube 20 mg by PEG Tube route daily.     Marland Kitchen PRO-STAT 64 PO LIQD PEG Tube 30 mLs by PEG Tube route 2 (two) times daily.     Marland Kitchen FERROUS SULFATE 220 (44 FE) MG/5ML PO ELIX PEG Tube 330 mg by PEG Tube route 3 (three) times daily with meals.     . OMEGA-3 FATTY ACIDS 1000 MG PO CAPS PEG Tube 1 g by PEG Tube route 2 (two)  times daily. Via peg tube    . HALOPERIDOL 2 MG PO TABS PEG Tube 2 mg by PEG Tube route every 6 (six) hours as needed. Agitation    . HALOPERIDOL LACTATE 5 MG/ML IJ SOLN Intramuscular Inject 2 mg into the muscle every 6 (six) hours as needed.    . IPRATROPIUM-ALBUTEROL 0.5-2.5 (3) MG/3ML IN SOLN Nebulization Take 3 mLs by nebulization every 6 (six) hours as needed.     . METHIMAZOLE 5 MG PO TABS Oral Take 5 mg by mouth daily. Via tube     . METHYLCELLULOSE 1 % OP SOLN Both Eyes Place 1 drop into both eyes 4 (four) times daily.      Marland Kitchen METOPROLOL TARTRATE 25 MG/10 ML ORAL SUSPENSION PEG Tube 12.5 mg by PEG Tube route 2 (two) times daily.     Marland Kitchen GOLDEN AGE VITAMIN/MINERALS PO LIQD PEG Tube 5 mLs by PEG Tube route daily.    Marland Kitchen VITAL 1.5 CAL PO LIQD Per Tube Place 42 mLs into feeding tube every hour.      Marland Kitchen PANTOPRAZOLE 40 MG/20 ML SUSPENSION PEG Tube 40 mg by PEG Tube route daily at 12 noon.     Marland Kitchen POLYETHYLENE GLYCOL  3350 PO PACK PEG Tube 17 g by PEG Tube route 2 (two) times daily. Mix 17gm 4-8 ounces of liquid    . VENLAFAXINE HCL ER 75 MG PO CP24 PEG Tube 75 mg by PEG Tube route daily.    . CEPHALEXIN 250 MG/5ML PO SUSR PEG Tube 500 mg by PEG Tube route 4 (four) times daily. Take 10 ml  Per feeding tube qid for 10 days      BP 100/69  Pulse 82  Temp(Src) 98 F (36.7 C) (Oral)  Resp 16  SpO2 97%  Physical Exam  Nursing note and vitals reviewed. Constitutional: He appears well-developed and well-nourished.  HENT:  Head: Normocephalic and atraumatic.  Neck: Neck supple.  Cardiovascular: Normal rate, regular rhythm and normal heart sounds.   Pulmonary/Chest: Breath sounds normal. No respiratory distress. He has no wheezes. He has no rales. He exhibits no tenderness.  Abdominal: Soft. Bowel sounds are normal. There is generalized tenderness. There is no rigidity, no rebound and no guarding.    Musculoskeletal: Normal range of motion.  Neurological: He is alert.    ED Course  Procedures  (including critical care time)  Labs Reviewed - No data to display Dg Abd 1 View  04/12/2012  *RADIOLOGY REPORT*  Clinical Data: PEG tube placement.  ABDOMEN - 1 VIEW  Comparison: Abdominal radiograph performed 01/10/2012  Findings: 20 mL of injected Omnipaque 300 contrast is noted filling the gastric fundus; the patient's PEG tube is seen in appropriate position.  The visualized bowel gas pattern is nonspecific, with a short segment of distended small bowel at the right mid abdomen, but no evidence for bowel obstruction.  Residual contrast is seen within the sigmoid colon. No free intra-abdominal air is identified, though evaluation for free air is limited on a single supine view. Scattered clips are seen at the right side of the abdomen.  No acute osseous abnormalities are seen.  IMPRESSION:  1.  PEG tube noted in appropriate position, at the body of the stomach. 2.  Nonspecific bowel gas pattern; no evidence for bowel obstruction.  No free intra-abdominal air seen.  Original Report Authenticated By: Tonia Ghent, M.D.     1. Encounter for care related to feeding tube       MDM  Patient sent from nursing home for confirmation of correct PEG tube placement.  PEG tube is correctly placed per exam and xray.  No other concerns, exam unremarkable (abdominal exam benign).  D/C back to nursing facility with PCP follow up.        Dillard Cannon [place], [place] 04/12/12 [phone]

## 2012-04-12 NOTE — ED Notes (Signed)
Pt. Discharged  To Nursing home via [facility]

## 2012-04-12 NOTE — ED Notes (Signed)
Received pt. From Nursing home via EMS for g tube placement confirmation.

## 2012-04-12 NOTE — Discharge Instructions (Signed)
The PEG tube is in the proper position, confirmed by xray.  Please follow up with your primary care provider.  You may return to the ER at any time for worsening condition or any new symptoms that concern you.

## 2012-04-12 NOTE — ED Provider Notes (Signed)
Medical screening examination/treatment/procedure(s) were performed by non-physician practitioner and as supervising physician I was immediately available for consultation/collaboration.   Zanae Kuehnle, MD 04/12/12 0726 

## 2012-04-12 NOTE — ED Notes (Signed)
YNW:[id number]<OZ> Expected date:<BR> Expected time:<BR> Means of arrival:<BR> Comments:<BR> From [facility]-G-tube replaced-needs x-ray for placement

## 2012-09-30 ENCOUNTER — Emergency Department (HOSPITAL_COMMUNITY)
Admission: EM | Admit: 2012-09-30 | Discharge: 2012-09-30 | Disposition: A | Discharge status: Home / Self Care | Payer: Medicare Other | Attending: Emergency Medicine | Admitting: Emergency Medicine

## 2012-09-30 ENCOUNTER — Encounter (HOSPITAL_COMMUNITY): Payer: Self-pay | Admitting: Emergency Medicine

## 2012-09-30 DIAGNOSIS — Z885 Allergy status to narcotic agent status: Secondary | ICD-10-CM | POA: Insufficient documentation

## 2012-09-30 DIAGNOSIS — E785 Hyperlipidemia, unspecified: Secondary | ICD-10-CM | POA: Insufficient documentation

## 2012-09-30 DIAGNOSIS — R569 Unspecified convulsions: Secondary | ICD-10-CM | POA: Insufficient documentation

## 2012-09-30 DIAGNOSIS — Z91040 Latex allergy status: Secondary | ICD-10-CM | POA: Insufficient documentation

## 2012-09-30 DIAGNOSIS — Z87820 Personal history of traumatic brain injury: Secondary | ICD-10-CM | POA: Insufficient documentation

## 2012-09-30 DIAGNOSIS — H409 Unspecified glaucoma: Secondary | ICD-10-CM | POA: Insufficient documentation

## 2012-09-30 DIAGNOSIS — Z888 Allergy status to other drugs, medicaments and biological substances status: Secondary | ICD-10-CM | POA: Insufficient documentation

## 2012-09-30 DIAGNOSIS — K219 Gastro-esophageal reflux disease without esophagitis: Secondary | ICD-10-CM | POA: Insufficient documentation

## 2012-09-30 DIAGNOSIS — R1310 Dysphagia, unspecified: Secondary | ICD-10-CM | POA: Insufficient documentation

## 2012-09-30 DIAGNOSIS — Z431 Encounter for attention to gastrostomy: Secondary | ICD-10-CM | POA: Insufficient documentation

## 2012-09-30 DIAGNOSIS — I252 Old myocardial infarction: Secondary | ICD-10-CM | POA: Insufficient documentation

## 2012-09-30 DIAGNOSIS — E039 Hypothyroidism, unspecified: Secondary | ICD-10-CM | POA: Insufficient documentation

## 2012-09-30 DIAGNOSIS — Z79899 Other long term (current) drug therapy: Secondary | ICD-10-CM | POA: Insufficient documentation

## 2012-09-30 DIAGNOSIS — I4891 Unspecified atrial fibrillation: Secondary | ICD-10-CM | POA: Insufficient documentation

## 2012-09-30 DIAGNOSIS — Z880 Allergy status to penicillin: Secondary | ICD-10-CM | POA: Insufficient documentation

## 2012-09-30 DIAGNOSIS — E2749 Other adrenocortical insufficiency: Secondary | ICD-10-CM | POA: Insufficient documentation

## 2012-09-30 DIAGNOSIS — G8250 Quadriplegia, unspecified: Secondary | ICD-10-CM | POA: Insufficient documentation

## 2012-09-30 DIAGNOSIS — T85598A Other mechanical complication of other gastrointestinal prosthetic devices, implants and grafts, initial encounter: Secondary | ICD-10-CM

## 2012-09-30 NOTE — ED Notes (Signed)
Per EMS pt transported from [facility] rehab center, staff reports pt found with feeding tube displaced. Staff attempted to insert #18 foley unsuccessful.

## 2012-09-30 NOTE — ED Provider Notes (Signed)
History    46 year old male presenting from rehabilitation center for evaluation after his gastric tube fell out. Unknown how long he was out. Patient was found with it laying beside him. He attempted to place a Foley catheter unsuccessfully prior to arrival. Patient has a history of and anoxic brain injury and dysphagia.  CSN: [id number]  Arrival date & time 09/30/12  2113   First MD Initiated Contact with Patient 09/30/12 2139      Chief Complaint  Patient presents with  . Feeding tube out     (Consider location/radiation/quality/duration/timing/severity/associated sxs/prior treatment) HPI  Past Medical History  Diagnosis Date  . Addison disease   . Anoxic brain injury   . A-fib   . Seizure disorder   . Dysphagia   . History of recurrent UTIs   . Hyperlipemia   . Aspiration pneumonia   . Mental disorder   . Anxiety   . Reflux   . Dysphagia   . Glucocorticoid deficiency   . Atrial fibrillation   . Glaucoma   . Hypothyroidism   . Encephalopathy   . Quadriplegia   . Addison disease   . Myocardial infarction     Past Surgical History  Procedure Date  . Nephrectomy unknown  . Peg tube placement     No family history on file.  History  Substance Use Topics  . Smoking status: Never Smoker   . Smokeless tobacco: Never Used  . Alcohol Use: No      Review of Systems  Level V caveat applies because patient is nonverbal or at least not speaking with me.  Allergies  Latex; Morphine and related; Penicillins; Pyridium; and Soy allergy  Home Medications   Current Outpatient Rx  Name Route Sig Dispense Refill  . DIPHENHYDRAMINE HCL 25 MG PO CAPS PEG Tube 25 mg by PEG Tube route every 6 (six) hours as needed. For allergic reaction    . PRO-STAT 64 PO LIQD PEG Tube 30 mLs by PEG Tube route 2 (two) times daily.     Marland Kitchen FERROUS SULFATE 220 (44 FE) MG/5ML PO ELIX PEG Tube 330 mg by PEG Tube route 3 (three) times daily with meals.     . OMEGA-3 FATTY ACIDS 1000 MG PO  CAPS PEG Tube 1 g by PEG Tube route 2 (two) times daily. Via peg tube    . HALOPERIDOL 2 MG PO TABS PEG Tube 2 mg by PEG Tube route every 6 (six) hours as needed. Agitation    . IPRATROPIUM-ALBUTEROL 0.5-2.5 (3) MG/3ML IN SOLN Nebulization Take 3 mLs by nebulization every 6 (six) hours as needed. For shortness of breath    . METHIMAZOLE 5 MG PO TABS PEG Tube 2.5 mg by PEG Tube route daily. Via tube     . METHYLCELLULOSE 1 % OP SOLN Both Eyes Place 1 drop into both eyes 4 (four) times daily.      Marland Kitchen METOPROLOL TARTRATE 25 MG/10 ML ORAL SUSPENSION PEG Tube 12.5 mg by PEG Tube route 2 (two) times daily.     Marland Kitchen GOLDEN AGE VITAMIN/MINERALS PO LIQD PEG Tube 5 mLs by PEG Tube route daily.    Marland Kitchen VITAL 1.5 CAL PO LIQD Per Tube Place 1,000 mLs into feeding tube every hour. 52ml/hr continously    . PANTOPRAZOLE 40 MG/20 ML SUSPENSION PEG Tube 40 mg by PEG Tube route daily at 12 noon.     Marland Kitchen POLYETHYLENE GLYCOL 3350 PO PACK PEG Tube 17 g by PEG Tube route 2 (two) times  daily. Mix 17gm 4-8 ounces of liquid    . PROBIOTIC DAILY PO PEG Tube 1 capsule by PEG Tube route 2 (two) times daily. For 10 days only    Unknown stop date    . VENLAFAXINE HCL 37.5 MG PO TABS PEG Tube 37.5 mg by PEG Tube route 2 (two) times daily.    Marland Kitchen HALOPERIDOL LACTATE 5 MG/ML IJ SOLN Intramuscular Inject 2 mg into the muscle every 6 (six) hours as needed.      BP 112/65  Pulse 68  Temp 98.2 F (36.8 C) (Oral)  Resp 16  SpO2 98%  Physical Exam  Nursing note and vitals reviewed. Constitutional: He appears well-developed. No distress.       Laying in bed. No acute distress.  HENT:  Mouth/Throat: Oropharynx is clear and moist.  Eyes: Conjunctivae normal are normal. Right eye exhibits no discharge. Left eye exhibits no discharge.  Neck: Neck supple.  Cardiovascular: Normal rate, regular rhythm and normal heart sounds.  Exam reveals no gallop and no friction rub.   No murmur heard. Pulmonary/Chest: Effort normal and breath sounds  normal. No respiratory distress.  Abdominal: Soft. He exhibits no distension. There is no tenderness.       G-tube tract in left upper quadrant with healthy appearing granulation tissue. Abdomen is soft and nondistended. No apparent tenderness.  Musculoskeletal: He exhibits no edema and no tenderness.  Neurological: He is alert.  Skin: Skin is warm and dry.  Psychiatric: He has a normal mood and affect.    ED Course  Gastrostomy tube replacement Date/Time: 09/30/2012 9:58 PM Performed by: Raeford Razor Authorized by: Raeford Razor Patient identity confirmed: arm band and provided demographic data Time out: Immediately prior to procedure a "time out" was called to verify the correct patient, procedure, equipment, support staff and site/side marked as required. Preparation: Patient was prepped and draped in the usual sterile fashion. Local anesthesia used: no Patient sedated: no Patient tolerance: Patient tolerated the procedure well with no immediate complications. Comments: 33 French gastric tube was passed easily through existing tract without any resistance. The balloon was inflated with approximately 9 mL of saline. Approximately 60 mL of sterile water was flushed and withdrawn easily. No apparent discomfort throughout procedure.   (including critical care time)  Labs Reviewed - No data to display No results found.   1. Feeding tube dysfunction       MDM  46 year old male presenting after his gastric tube was dislodged. 52 French gastric tube was placed. This passed easily. It flushed easily and good return. We'll discharge patient back to his facility to        Raeford Razor, MD 09/30/12 2159

## 2012-09-30 NOTE — ED Notes (Signed)
Bed:WA18<BR> Expected date:<BR> Expected time:<BR> Means of arrival:<BR> Comments:<BR>

## 2012-09-30 NOTE — ED Notes (Signed)
#  18 PEG Tube replaced by Dr.Kohut

## 2013-01-02 ENCOUNTER — Encounter (HOSPITAL_COMMUNITY): Payer: Self-pay

## 2013-01-02 ENCOUNTER — Observation Stay (HOSPITAL_COMMUNITY)
Admission: EM | Admit: 2013-01-02 | Discharge: 2013-01-05 | Disposition: A | Discharge status: Skilled Nursing Facility | Payer: Medicare Other | Attending: Internal Medicine | Admitting: Internal Medicine

## 2013-01-02 ENCOUNTER — Emergency Department (HOSPITAL_COMMUNITY): Payer: Medicare Other

## 2013-01-02 DIAGNOSIS — Z79899 Other long term (current) drug therapy: Secondary | ICD-10-CM | POA: Insufficient documentation

## 2013-01-02 DIAGNOSIS — G931 Anoxic brain damage, not elsewhere classified: Secondary | ICD-10-CM

## 2013-01-02 DIAGNOSIS — D696 Thrombocytopenia, unspecified: Secondary | ICD-10-CM

## 2013-01-02 DIAGNOSIS — R627 Adult failure to thrive: Secondary | ICD-10-CM

## 2013-01-02 DIAGNOSIS — E162 Hypoglycemia, unspecified: Secondary | ICD-10-CM

## 2013-01-02 DIAGNOSIS — F458 Other somatoform disorders: Secondary | ICD-10-CM

## 2013-01-02 DIAGNOSIS — I4891 Unspecified atrial fibrillation: Secondary | ICD-10-CM | POA: Insufficient documentation

## 2013-01-02 DIAGNOSIS — E2749 Other adrenocortical insufficiency: Secondary | ICD-10-CM | POA: Insufficient documentation

## 2013-01-02 DIAGNOSIS — D698 Other specified hemorrhagic conditions: Secondary | ICD-10-CM

## 2013-01-02 DIAGNOSIS — E860 Dehydration: Secondary | ICD-10-CM

## 2013-01-02 DIAGNOSIS — K668 Other specified disorders of peritoneum: Secondary | ICD-10-CM

## 2013-01-02 DIAGNOSIS — X58XXXA Exposure to other specified factors, initial encounter: Secondary | ICD-10-CM | POA: Insufficient documentation

## 2013-01-02 DIAGNOSIS — F32A Depression, unspecified: Secondary | ICD-10-CM

## 2013-01-02 DIAGNOSIS — R4182 Altered mental status, unspecified: Secondary | ICD-10-CM

## 2013-01-02 DIAGNOSIS — R1310 Dysphagia, unspecified: Secondary | ICD-10-CM

## 2013-01-02 DIAGNOSIS — R109 Unspecified abdominal pain: Principal | ICD-10-CM

## 2013-01-02 DIAGNOSIS — N390 Urinary tract infection, site not specified: Secondary | ICD-10-CM

## 2013-01-02 DIAGNOSIS — E870 Hyperosmolality and hypernatremia: Secondary | ICD-10-CM

## 2013-01-02 DIAGNOSIS — E876 Hypokalemia: Secondary | ICD-10-CM

## 2013-01-02 DIAGNOSIS — E0590 Thyrotoxicosis, unspecified without thyrotoxic crisis or storm: Secondary | ICD-10-CM

## 2013-01-02 DIAGNOSIS — F489 Nonpsychotic mental disorder, unspecified: Secondary | ICD-10-CM | POA: Insufficient documentation

## 2013-01-02 DIAGNOSIS — K5900 Constipation, unspecified: Secondary | ICD-10-CM | POA: Insufficient documentation

## 2013-01-02 DIAGNOSIS — K5909 Other constipation: Secondary | ICD-10-CM

## 2013-01-02 DIAGNOSIS — G40409 Other generalized epilepsy and epileptic syndromes, not intractable, without status epilepticus: Secondary | ICD-10-CM

## 2013-01-02 DIAGNOSIS — E46 Unspecified protein-calorie malnutrition: Secondary | ICD-10-CM

## 2013-01-02 DIAGNOSIS — E271 Primary adrenocortical insufficiency: Secondary | ICD-10-CM

## 2013-01-02 DIAGNOSIS — N179 Acute kidney failure, unspecified: Secondary | ICD-10-CM

## 2013-01-02 DIAGNOSIS — F99 Mental disorder, not otherwise specified: Secondary | ICD-10-CM

## 2013-01-02 DIAGNOSIS — Z431 Encounter for attention to gastrostomy: Secondary | ICD-10-CM

## 2013-01-02 DIAGNOSIS — E878 Other disorders of electrolyte and fluid balance, not elsewhere classified: Secondary | ICD-10-CM

## 2013-01-02 DIAGNOSIS — Z8679 Personal history of other diseases of the circulatory system: Secondary | ICD-10-CM

## 2013-01-02 DIAGNOSIS — F329 Major depressive disorder, single episode, unspecified: Secondary | ICD-10-CM

## 2013-01-02 LAB — COMPREHENSIVE METABOLIC PANEL
ALT: 13 U/L (ref 0–53)
AST: 22 U/L (ref 0–37)
CO2: 20 mEq/L (ref 19–32)
Chloride: 99 mEq/L (ref 96–112)
Creatinine, Ser: 0.95 mg/dL (ref 0.50–1.35)
GFR calc non Af Amer: >90 mL/min (ref >90)
Total Bilirubin: 1.4 mg/dL — ABNORMAL HIGH (ref 0.3–1.2)

## 2013-01-02 LAB — CBC WITH DIFFERENTIAL/PLATELET
Basophils Absolute: 0 10*3/uL (ref 0.0–0.1)
Basophils Relative: 0 % (ref 0–1)
Eosinophils Absolute: 0 10*3/uL (ref 0.0–0.7)
Eosinophils Relative: 0 % (ref 0–5)
MCH: 30.7 pg (ref 26.0–34.0)
MCV: 86.1 fL (ref 78.0–100.0)
Platelets: 138 10*3/uL — ABNORMAL LOW (ref 150–400)
RDW: 12.2 % (ref 11.5–15.5)
WBC: 16.1 10*3/uL — ABNORMAL HIGH (ref 4.0–10.5)

## 2013-01-02 LAB — GLUCOSE, CAPILLARY

## 2013-01-02 MED ORDER — FENTANYL CITRATE 0.05 MG/ML IJ SOLN
100.0000 ug | Freq: Once | INTRAMUSCULAR | Status: AC
  Administered 2013-01-02: 100 ug via INTRAVENOUS
  Filled 2013-01-02: qty 2

## 2013-01-02 MED ORDER — ONDANSETRON HCL 4 MG/2ML IJ SOLN
4.0000 mg | Freq: Once | INTRAMUSCULAR | Status: AC
  Administered 2013-01-02: 4 mg via INTRAVENOUS
  Filled 2013-01-02: qty 2

## 2013-01-02 MED ORDER — SODIUM CHLORIDE 0.9 % IV SOLN
1000.0000 mL | INTRAVENOUS | Status: DC
  Administered 2013-01-03: 1000 mL via INTRAVENOUS

## 2013-01-02 MED ORDER — SODIUM CHLORIDE 0.9 % IV SOLN
1000.0000 mL | Freq: Once | INTRAVENOUS | Status: AC
  Administered 2013-01-03: 1000 mL via INTRAVENOUS

## 2013-01-02 NOTE — ED Notes (Signed)
EKG given to EDP. J.Knapp, MD.

## 2013-01-02 NOTE — ED Notes (Signed)
Per EMS pt from [facility], per NH pt became non-verbal, gritting his teeth, restless approx 3 hrs ago, LSN 1830, pt currently being treated for a UTI, pt able to follow commands, no neuro deficits, arm drift or facial droop, pt non verbal w/ems en route. Staff reports pt c/o headache earlier today

## 2013-01-02 NOTE — ED Notes (Signed)
Notified RN, Verlon Au CBG 80.

## 2013-01-02 NOTE — ED Notes (Signed)
Notified RN, Merle elevated heart rate of 130.

## 2013-01-02 NOTE — ED Notes (Signed)
Pt. Made aware for the need of urine. 

## 2013-01-02 NOTE — ED Notes (Signed)
[name] pt's mother (C) [phone] please call w/any info, mother lives in [place] and is unable to get transportation here

## 2013-01-02 NOTE — ED Provider Notes (Signed)
History    CSN: [id number] Arrival date & time 01/02/13  2026 First MD Initiated Contact with Patient 01/02/13 2148      Chief complaint: Abdominal pain    HPI Patient presents to the emergency room with complaints of abdominal pain. He was sent from [facility] nursing home because they were concerned about him being nonverbal and gritting his teeth. Patient himself is very clear in stating that he is having pain.  States he has been vomiting. He states that the pain is all over.He is gritting his teeth because he is uncomfortable. The patient has a history of anoxic brain injury and quadriplegia.   Past Medical History  Diagnosis Date  . Addison disease   . Anoxic brain injury   . A-fib   . Seizure disorder   . Dysphagia   . History of recurrent UTIs   . Hyperlipemia   . Aspiration pneumonia   . Mental disorder   . Anxiety   . Reflux   . Dysphagia   . Glucocorticoid deficiency   . Atrial fibrillation   . Glaucoma(365)   . Hypothyroidism   . Encephalopathy   . Quadriplegia   . Addison disease   . Myocardial infarction     Past Surgical History  Procedure Date  . Nephrectomy unknown  . Peg tube placement     History reviewed. No pertinent family history.  History  Substance Use Topics  . Smoking status: Never Smoker   . Smokeless tobacco: Never Used  . Alcohol Use: No      Review of Systems  All other systems reviewed and are negative.    Allergies  Ativan; Latex; Morphine and related; Penicillins; Pyridium; and Soy allergy  Home Medications   Current Outpatient Rx  Name  Route  Sig  Dispense  Refill  . DIPHENHYDRAMINE HCL 25 MG PO CAPS   PEG Tube   25 mg by PEG Tube route every 6 (six) hours as needed. For allergic reaction         . PRO-STAT 64 PO LIQD   PEG Tube   30 mLs by PEG Tube route 2 (two) times daily.          Marland Kitchen FERROUS SULFATE 220 (44 FE) MG/5ML PO ELIX   PEG Tube   330 mg by PEG Tube route 3 (three) times daily with meals.            . OMEGA-3 FATTY ACIDS 1000 MG PO CAPS   PEG Tube   1 g by PEG Tube route 2 (two) times daily. Via peg tube         . IPRATROPIUM-ALBUTEROL 0.5-2.5 (3) MG/3ML IN SOLN   Nebulization   Take 3 mLs by nebulization every 6 (six) hours as needed. For shortness of breath         . METHYLCELLULOSE 1 % OP SOLN   Both Eyes   Place 1 drop into both eyes 4 (four) times daily.           Marland Kitchen METOPROLOL TARTRATE 25 MG/10 ML ORAL SUSPENSION   PEG Tube   12.5 mg by PEG Tube route 2 (two) times daily.          Marland Kitchen GOLDEN AGE VITAMIN/MINERALS PO LIQD   PEG Tube   5 mLs by PEG Tube route daily.         Marland Kitchen NITROFURANTOIN MONOHYD MACRO 100 MG PO CAPS   Oral   Take 100 mg by mouth 2 (two) times  daily. For UTI.         Marland Kitchen VITAL 1.5 CAL PO LIQD   Per Tube   Place 1,000 mLs into feeding tube every hour. 11ml/hr continously from 2000 to 0500.         Marland Kitchen PANTOPRAZOLE 40 MG/20 ML SUSPENSION   PEG Tube   40 mg by PEG Tube route daily at 12 noon.          Marland Kitchen POLYETHYLENE GLYCOL 3350 PO PACK   PEG Tube   17 g by PEG Tube route 2 (two) times daily. Mix 17gm 4-8 ounces of liquid         . VENLAFAXINE HCL 37.5 MG PO TABS   PEG Tube   37.5 mg by PEG Tube route 2 (two) times daily.           BP 130/79  Pulse 130  Temp 99.7 F (37.6 C) (Oral)  Resp 20  SpO2 96%  Physical Exam  Nursing note and vitals reviewed. Constitutional: He is oriented to person, place, and time. He appears distressed.  HENT:  Head: Normocephalic and atraumatic.  Right Ear: External ear normal.  Left Ear: External ear normal.  Eyes: Conjunctivae normal are normal. Right eye exhibits no discharge. Left eye exhibits no discharge. No scleral icterus.  Neck: Neck supple. No tracheal deviation present.  Cardiovascular: Normal rate, regular rhythm and intact distal pulses.   Pulmonary/Chest: Effort normal and breath sounds normal. No stridor. No respiratory distress. He has no wheezes. He has no rales.   Abdominal: Soft. Bowel sounds are normal. He exhibits no distension and no mass. There is generalized tenderness. There is no rigidity, no rebound and no guarding.  Musculoskeletal: He exhibits no edema and no tenderness.  Neurological: He is alert and oriented to person, place, and time. He displays atrophy. No cranial nerve deficit ( no gross defecits noted). He exhibits abnormal muscle tone. He displays no seizure activity.  Skin: Skin is warm. No rash noted.  Psychiatric: He has a normal mood and affect.    ED Course  Procedures (including critical care time) EKG SINUS TACHYCARDIA ~ V-rate> 99, rate 129 LOW VOLTAGE WITH RIGHT AXIS DEVIATION ~ low voltage, RAD BASELINE WANDER IN LEAD(S) III  Labs Reviewed  COMPREHENSIVE METABOLIC PANEL - Abnormal; Notable for the following:    Sodium 133 (*)     Total Bilirubin 1.4 (*)     All other components within normal limits  CBC WITH DIFFERENTIAL - Abnormal; Notable for the following:    WBC 16.1 (*)     Platelets 138 (*)     Neutrophils Relative 82 (*)     Neutro Abs 13.1 (*)     Lymphocytes Relative 11 (*)     Monocytes Absolute 1.1 (*)     All other components within normal limits  GLUCOSE, CAPILLARY  LIPASE, BLOOD  URINALYSIS, ROUTINE W REFLEX MICROSCOPIC   Dg Abd Acute W/chest  01/02/2013  *RADIOLOGY REPORT*  Clinical Data: Abdominal pain.  ACUTE ABDOMEN SERIES (ABDOMEN 2 VIEW & CHEST 1 VIEW)  Comparison: CT abdomen and pelvis and chest and two views abdomen 12/26/2011.  Findings: Single view of the chest demonstrates clear lungs and a normal heart size.  Elevation right hemidiaphragm is again seen as on the prior studies.  Two views of the abdomen show no free intraperitoneal air.  Feeding tube is noted.  Bowel gas pattern is unremarkable.  IMPRESSION: No acute finding chest or abdomen.   Original Report Authenticated  By: Holley Dexter, M.D.       MDM  Pt does not appear to be altered in the ED.  He is uncomfortable  complaining of abdominal pain.  Will plan on labs, CT scan for further evaluation.        Celene Kras, MD 01/03/13 0001

## 2013-01-02 NOTE — ED Notes (Signed)
ZOX:[id number]<AV> Expected date:<BR> Expected time:<BR> Means of arrival:<BR> Comments:<BR> From [facility]-altered LOC

## 2013-01-02 NOTE — ED Notes (Addendum)
Pt. Agitated, thrashing around in the bed, unable to keep Ekg leads on pt. Pt. Sweating. Replaced EKG leads. RN, Christeen Douglas notified. Nurse advised NT patient is getting med to keep him calm. Will continue to monitor.

## 2013-01-02 NOTE — ED Notes (Signed)
Pt thrashing around and unable to get a good reading on the cardiac monitor.  Informed MD.  MD ok with taking pt off cardiac monitor.

## 2013-01-03 ENCOUNTER — Observation Stay (HOSPITAL_COMMUNITY): Payer: Medicare Other

## 2013-01-03 ENCOUNTER — Encounter (HOSPITAL_COMMUNITY): Payer: Self-pay | Admitting: Internal Medicine

## 2013-01-03 DIAGNOSIS — R109 Unspecified abdominal pain: Secondary | ICD-10-CM | POA: Diagnosis present

## 2013-01-03 DIAGNOSIS — F99 Mental disorder, not otherwise specified: Secondary | ICD-10-CM | POA: Diagnosis present

## 2013-01-03 DIAGNOSIS — E271 Primary adrenocortical insufficiency: Secondary | ICD-10-CM | POA: Diagnosis present

## 2013-01-03 DIAGNOSIS — R1310 Dysphagia, unspecified: Secondary | ICD-10-CM

## 2013-01-03 LAB — URINALYSIS, ROUTINE W REFLEX MICROSCOPIC
Bilirubin Urine: NEGATIVE
Ketones, ur: 15 mg/dL — AB
Nitrite: NEGATIVE
Protein, ur: NEGATIVE mg/dL
pH: 5.5 (ref 5.0–8.0)

## 2013-01-03 LAB — URINE MICROSCOPIC-ADD ON

## 2013-01-03 MED ORDER — VENLAFAXINE HCL 37.5 MG PO TABS
37.5000 mg | ORAL_TABLET | Freq: Two times a day (BID) | ORAL | Status: DC
  Administered 2013-01-04 – 2013-01-05 (×3): 37.5 mg via ORAL
  Filled 2013-01-03 (×6): qty 1

## 2013-01-03 MED ORDER — HYDROMORPHONE HCL PF 1 MG/ML IJ SOLN
1.0000 mg | Freq: Once | INTRAMUSCULAR | Status: AC
  Administered 2013-01-03: 1 mg via INTRAVENOUS
  Filled 2013-01-03: qty 1

## 2013-01-03 MED ORDER — ENOXAPARIN SODIUM 40 MG/0.4ML ~~LOC~~ SOLN
40.0000 mg | Freq: Every day | SUBCUTANEOUS | Status: DC
  Administered 2013-01-03 – 2013-01-05 (×3): 40 mg via SUBCUTANEOUS
  Filled 2013-01-03 (×3): qty 0.4

## 2013-01-03 MED ORDER — POLYETHYLENE GLYCOL 3350 17 G PO PACK
17.0000 g | PACK | Freq: Two times a day (BID) | ORAL | Status: DC
  Administered 2013-01-03 – 2013-01-05 (×4): 17 g
  Filled 2013-01-03 (×7): qty 1

## 2013-01-03 MED ORDER — NITROFURANTOIN MONOHYD MACRO 100 MG PO CAPS
100.0000 mg | ORAL_CAPSULE | Freq: Two times a day (BID) | ORAL | Status: DC
  Administered 2013-01-03 – 2013-01-05 (×4): 100 mg via ORAL
  Filled 2013-01-03 (×6): qty 1

## 2013-01-03 MED ORDER — VITAL 1.5 CAL PO LIQD
1000.0000 mL | ORAL | Status: DC
  Filled 2013-01-03: qty 1000

## 2013-01-03 MED ORDER — ACETAMINOPHEN 325 MG PO TABS: 650.0000 mg | ORAL_TABLET | Freq: Four times a day (QID) | ORAL | Status: DC | PRN

## 2013-01-03 MED ORDER — SODIUM CHLORIDE 0.9 % IV SOLN
1000.0000 mL | INTRAVENOUS | Status: DC
  Administered 2013-01-03 – 2013-01-05 (×2): 1000 mL via INTRAVENOUS

## 2013-01-03 MED ORDER — SODIUM CHLORIDE 0.9 % IJ SOLN: 3.0000 mL | INTRAMUSCULAR | Status: DC | PRN

## 2013-01-03 MED ORDER — POLYVINYL ALCOHOL 1.4 % OP SOLN
1.0000 [drp] | Freq: Four times a day (QID) | OPHTHALMIC | Status: DC
  Administered 2013-01-03 – 2013-01-05 (×8): 1 [drp] via OPHTHALMIC
  Filled 2013-01-03: qty 15

## 2013-01-03 MED ORDER — ADULT MULTIVITAMIN LIQUID CH
5.0000 mL | Freq: Every day | ORAL | Status: DC
  Administered 2013-01-04: 1 mL
  Administered 2013-01-05: 5 mL
  Filled 2013-01-03 (×3): qty 5

## 2013-01-03 MED ORDER — SODIUM CHLORIDE 0.9 % IV SOLN: 250.0000 mL | INTRAVENOUS | Status: DC | PRN

## 2013-01-03 MED ORDER — DIAZEPAM 5 MG/ML IJ SOLN
5.0000 mg | Freq: Once | INTRAMUSCULAR | Status: AC
  Administered 2013-01-03: 5 mg via INTRAVENOUS
  Filled 2013-01-03: qty 2

## 2013-01-03 MED ORDER — SODIUM CHLORIDE 0.9 % IJ SOLN: 3.0000 mL | Freq: Two times a day (BID) | INTRAMUSCULAR | Status: DC

## 2013-01-03 MED ORDER — METOPROLOL TARTRATE 25 MG/10 ML ORAL SUSPENSION
12.5000 mg | Freq: Two times a day (BID) | ORAL | Status: DC
  Administered 2013-01-03 – 2013-01-05 (×5): 12.5 mg
  Filled 2013-01-03 (×6): qty 5

## 2013-01-03 MED ORDER — HYDROCORTISONE SOD SUCCINATE 100 MG IJ SOLR: 100.0000 mg | Freq: Once | INTRAMUSCULAR | Status: DC

## 2013-01-03 MED ORDER — PRO-STAT SUGAR FREE PO LIQD
30.0000 mL | Freq: Two times a day (BID) | ORAL | Status: DC
  Administered 2013-01-03 – 2013-01-05 (×4): 30 mL
  Filled 2013-01-03 (×6): qty 30

## 2013-01-03 MED ORDER — FREE WATER
30.0000 mL | Freq: Four times a day (QID) | Status: DC
  Administered 2013-01-03 – 2013-01-05 (×7): 30 mL

## 2013-01-03 MED ORDER — PANTOPRAZOLE SODIUM 40 MG PO PACK
40.0000 mg | PACK | Freq: Every day | ORAL | Status: DC
  Administered 2013-01-04 – 2013-01-05 (×2): 40 mg
  Filled 2013-01-03 (×3): qty 20

## 2013-01-03 MED ORDER — ONDANSETRON HCL 4 MG/2ML IJ SOLN: 4.0000 mg | Freq: Four times a day (QID) | INTRAMUSCULAR | Status: DC | PRN

## 2013-01-03 MED ORDER — DIPHENHYDRAMINE HCL 25 MG PO CAPS: 25.0000 mg | ORAL_CAPSULE | Freq: Four times a day (QID) | ORAL | Status: DC | PRN

## 2013-01-03 MED ORDER — IOHEXOL 300 MG/ML  SOLN
100.0000 mL | Freq: Once | INTRAMUSCULAR | Status: AC | PRN
  Administered 2013-01-03: 100 mL via INTRAVENOUS

## 2013-01-03 MED ORDER — HYDROMORPHONE HCL PF 1 MG/ML IJ SOLN
1.0000 mg | Freq: Once | INTRAMUSCULAR | Status: DC
  Filled 2013-01-03 (×3): qty 1

## 2013-01-03 MED ORDER — VITAL 1.5 CAL PO LIQD
1000.0000 mL | ORAL | Status: DC
  Administered 2013-01-03 – 2013-01-04 (×2): 1000 mL
  Filled 2013-01-03 (×2): qty 1000

## 2013-01-03 MED ORDER — ONDANSETRON HCL 4 MG PO TABS: 4.0000 mg | ORAL_TABLET | Freq: Four times a day (QID) | ORAL | Status: DC | PRN

## 2013-01-03 NOTE — Progress Notes (Signed)
Attempted to offer food and drink for lunch and dinner patient shook head and refused to eat or drink.  Has not voided since arrival to floor, bladder scan showed 555 ml of urine, doctor notified, order given for in and out cath.  Patient refused by shaking head in no fashion and clamped jaws closed when needed to take po medication.

## 2013-01-03 NOTE — Progress Notes (Addendum)
Spoke with nursing staff at [facility], [place].  She relayed patient on a ground diet during day and Vital 1.5 at 65ml from 8pm-5am, LBM 01/02/13 formed stool, he  Alert to self only  and uses foul language mostly to communicate.  He has been on treatment for an UTI for seven days and they had not observed any bloody drainage from penis before bringing to hospital.  She stated night nurse reported that he was brought to hospital for his complaint of head pain not abdominal pain.

## 2013-01-03 NOTE — Progress Notes (Addendum)
INITIAL NUTRITION ASSESSMENT  DOCUMENTATION CODES Per approved criteria  -Obesity Unspecified   INTERVENTION: - Obtained verbal order from MD to resume pt's home TF schedule of Vital 1.5 at 66ml/hr from 8pm-5am and manage TF and resume puree/thin liquid diet - Initiated adult enteral protocol - If pt does not eat well on diet, will need to increase TF rate or length of time to meet >90% of estimated nutritional needs - Will continue to monitor  NUTRITION DIAGNOSIS: Inadequate oral intake related to dypshagia as evidenced by pt requiring PEG tube with TF for additional nutrition.   Goal: 1. TF tolerance 2. Pt to consume >90% of meals  Monitor:  Weights, labs, intake, TF tolerance  Reason for Assessment: Nutrition risk   47 y.o. male  Admitting Dx: Abdominal pain  ASSESSMENT: Pt asleep during visit, dietary history obtained from [facility]. Pt with PEG on TF schedule of Vital 1.5 at 82ml/hr from 8pm-5am with Prostat 30ml BID. This provides a total of 1078 calories, 69g protein, and free water. This meets 60% of estimated calorie needs and 92% estimated protein needs. Pt on puree diet with thin liquids. Staff reports pt's weight up 5 pounds from December 2013 to January 2014. Pt with quadriplegia. Pt with PEG tube from dysphagia however staff reports he was on a puree/thin liquid diet. Pt minimally verbal today per RN.   Height: Ht Readings from Last 1 Encounters:  12/22/11 5\' 9"  (1.753 m)    Weight: 01/04/12            230 lb (104.5 kg) Wt Readings from Last 1 Encounters:  12/23/11 218 lb 14.7 oz (99.3 kg)    Ideal Body Weight: 160 lb  % Ideal Body Weight: 144  Wt Readings from Last 10 Encounters:  12/23/11 218 lb 14.7 oz (99.3 kg)  11/25/11 230 lb 6.1 oz (104.5 kg)  11/20/11 230 lb 6.1 oz (104.5 kg)    Usual Body Weight: 225 lb in December 2013  % Usual Body Weight: 102  BMI:  34 kg/(m^2) Class I obesity  Estimated Nutritional Needs: Kcal:  1800-1900 Protein: 75-90g Fluid: 1.8-1.9L/day   Diet Order: Dysphagia 1, thin liquid  EDUCATION NEEDS: -No education needs identified at this time   Intake/Output Summary (Last 24 hours) at 01/03/13 1538 Last data filed at 01/03/13 0136  Gross per 24 hour  Intake    570 ml  Output     20 ml  Net    550 ml    Last BM: 1/6  Labs:   Lab 01/02/13 2230  NA 133*  K 4.0  CL 99  CO2 20  BUN 18  CREATININE 0.95  CALCIUM 9.6  MG --  PHOS --  GLUCOSE 80    CBG (last 3)   Basename 01/02/13 2040  GLUCAP 80    Scheduled Meds:   . enoxaparin (LOVENOX) injection  40 mg Subcutaneous Daily  . feeding supplement  30 mL Per Tube BID  . feeding supplement (VITAL 1.5 CAL)  1,000 mL Per Tube Q24H  .  HYDROmorphone (DILAUDID) injection  1 mg Intravenous Once  . metoprolol tartrate  12.5 mg Per Tube BID  . multivitamin  5 mL Per Tube Daily  . nitrofurantoin (macrocrystal-monohydrate)  100 mg Oral BID  . pantoprazole sodium  40 mg Per Tube Daily  . polyethylene glycol  17 g Per Tube BID  . polyvinyl alcohol  1 drop Both Eyes QID  . sodium chloride  3 mL Intravenous  Q12H  . venlafaxine  37.5 mg Oral BID WC    Continuous Infusions:   . sodium chloride Stopped (01/03/13 0429)    Past Medical History  Diagnosis Date  . Addison disease   . Anoxic brain injury   . A-fib   . Seizure disorder   . Dysphagia   . History of recurrent UTIs   . Hyperlipemia   . Aspiration pneumonia   . Mental disorder   . Anxiety   . Reflux   . Dysphagia   . Glucocorticoid deficiency   . Atrial fibrillation   . Glaucoma(365)   . Hypothyroidism   . Encephalopathy   . Quadriplegia   . Addison disease   . Myocardial infarction     Past Surgical History  Procedure Date  . Nephrectomy unknown  . Peg tube placement     Levon Hedger MS, RD, LDN [phone] Pager [phone] After Hours Pager

## 2013-01-03 NOTE — Progress Notes (Signed)
Shift event: RN had paged earlier in shift 2/2 pt being unable to void and over 500cc urine on bladder scan. Order for I&O cath and if unsuccessful, Foley placement. Both attempted without success and with some blood return in catheter. Order placed for Coude insertion and RN able to complete this. Urine is tea colored at present. Will cont to monitor for hematuria. Pt has CBC at 0500. Jimmye Norman, NP [facility]

## 2013-01-03 NOTE — ED Notes (Signed)
Patient transported to CT 

## 2013-01-03 NOTE — Plan of Care (Signed)
Problem: Phase I Progression Outcomes Goal: Voiding-avoid urinary catheter unless indicated Outcome: Not Progressing Condom catheter

## 2013-01-03 NOTE — ED Provider Notes (Signed)
Nursing notes and vitals signs, including pulse oximetry, reviewed.  Summary of this visit's results, reviewed by myself:  Labs:  Results for orders placed during the hospital encounter of 01/02/13 (from the past 24 hour(s))  GLUCOSE, CAPILLARY     Status: Normal   Collection Time   01/02/13  8:40 PM      Component Value Range   Glucose-Capillary 80  70 - 99 mg/dL   Comment 1 STAT Lab     Comment 2 Notify RN    COMPREHENSIVE METABOLIC PANEL     Status: Abnormal   Collection Time   01/02/13 10:30 PM      Component Value Range   Sodium 133 (*) 135 - 145 mEq/L   Potassium 4.0  3.5 - 5.1 mEq/L   Chloride 99  96 - 112 mEq/L   CO2 20  19 - 32 mEq/L   Glucose, Bld 80  70 - 99 mg/dL   BUN 18  6 - 23 mg/dL   Creatinine, Ser 1.61  0.50 - 1.35 mg/dL   Calcium 9.6  8.4 - 09.6 mg/dL   Total Protein 7.6  6.0 - 8.3 g/dL   Albumin 4.0  3.5 - 5.2 g/dL   AST 22  0 - 37 U/L   ALT 13  0 - 53 U/L   Alkaline Phosphatase 73  39 - 117 U/L   Total Bilirubin 1.4 (*) 0.3 - 1.2 mg/dL   GFR calc non Af Amer >90  >90 mL/min   GFR calc Af Amer >90  >90 mL/min  LIPASE, BLOOD     Status: Normal   Collection Time   01/02/13 10:30 PM      Component Value Range   Lipase 26  11 - 59 U/L  CBC WITH DIFFERENTIAL     Status: Abnormal   Collection Time   01/02/13 10:30 PM      Component Value Range   WBC 16.1 (*) 4.0 - 10.5 K/uL   RBC 5.27  4.22 - 5.81 MIL/uL   Hemoglobin 16.2  13.0 - 17.0 g/dL   HCT 04.5  40.9 - 81.1 %   MCV 86.1  78.0 - 100.0 fL   MCH 30.7  26.0 - 34.0 pg   MCHC 35.7  30.0 - 36.0 g/dL   RDW 91.4  78.2 - 95.6 %   Platelets 138 (*) 150 - 400 K/uL   Neutrophils Relative 82 (*) 43 - 77 %   Neutro Abs 13.1 (*) 1.7 - 7.7 K/uL   Lymphocytes Relative 11 (*) 12 - 46 %   Lymphs Abs 1.8  0.7 - 4.0 K/uL   Monocytes Relative 7  3 - 12 %   Monocytes Absolute 1.1 (*) 0.1 - 1.0 K/uL   Eosinophils Relative 0  0 - 5 %   Eosinophils Absolute 0.0  0.0 - 0.7 K/uL   Basophils Relative 0  0 - 1 %   Basophils  Absolute 0.0  0.0 - 0.1 K/uL  URINALYSIS, ROUTINE W REFLEX MICROSCOPIC     Status: Abnormal   Collection Time   01/03/13  1:35 AM      Component Value Range   Color, Urine AMBER (*) YELLOW   APPearance CLOUDY (*) CLEAR   Specific Gravity, Urine 1.027  1.005 - 1.030   pH 5.5  5.0 - 8.0   Glucose, UA NEGATIVE  NEGATIVE mg/dL   Hgb urine dipstick SMALL (*) NEGATIVE   Bilirubin Urine NEGATIVE  NEGATIVE   Ketones, ur 15 (*)  NEGATIVE mg/dL   Protein, ur NEGATIVE  NEGATIVE mg/dL   Urobilinogen, UA 1.0  0.0 - 1.0 mg/dL   Nitrite NEGATIVE  NEGATIVE   Leukocytes, UA TRACE (*) NEGATIVE  URINE MICROSCOPIC-ADD ON     Status: Normal   Collection Time   01/03/13  1:35 AM      Component Value Range   Squamous Epithelial / LPF RARE  RARE   WBC, UA 0-2  <3 WBC/hpf   RBC / HPF 0-2  <3 RBC/hpf   Bacteria, UA RARE  RARE    Imaging Studies: Ct Abdomen Pelvis W Contrast  01/03/2013  *RADIOLOGY REPORT*  Clinical Data: Pain.  Nausea and vomiting.  Elevated white blood cell count.  CT ABDOMEN AND PELVIS WITH CONTRAST  Technique:  Multidetector CT imaging of the abdomen and pelvis was performed following the standard protocol during bolus administration of intravenous contrast.  Contrast: OMNIPAQUE IOHEXOL 300 MG/ML  SOLN  Comparison: CT abdomen and pelvis 12/26/2011.  Findings: Bibasilar atelectasis is identified.  No pleural or pericardial effusion is seen.  The patient is status post cholecystectomy.  Postoperative change of right nephrectomy is seen as on the prior exam. There is compensatory hypertrophy of the left kidney. Small left renal cyst is noted.  A feeding tube is in place. Laxity of the right abdominal wall is unchanged.  The spleen and pancreas are unremarkable.  Mild biliary ductal prominence is unchanged.  No lymphadenopathy or fluid is identified.  The stomach and small and large bowel appear normal.  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  No worrisome bony finding is identified.  IMPRESSION:  1.  No acute finding. 2.  Status post cholecystectomy and right nephrectomy. 3.  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   Original Report Authenticated By: Holley Dexter, M.D.    Dg Abd Acute W/chest  01/02/2013  *RADIOLOGY REPORT*  Clinical Data: Abdominal pain.  ACUTE ABDOMEN SERIES (ABDOMEN 2 VIEW & CHEST 1 VIEW)  Comparison: CT abdomen and pelvis and chest and two views abdomen 12/26/2011.  Findings: Single view of the chest demonstrates clear lungs and a normal heart size.  Elevation right hemidiaphragm is again seen as on the prior studies.  Two views of the abdomen show no free intraperitoneal air.  Feeding tube is noted.  Bowel gas pattern is unremarkable.  IMPRESSION: No acute finding chest or abdomen.   Original Report Authenticated By: Holley Dexter, M.D.     2:53 AM Patient more comfortable after IV Dilaudid and Valium. Bruxism is improved. Etiology of patient's pain is unclear. [facility] will evaluate him for admission.   Hanley Seamen, MD 01/03/13 [phone]

## 2013-01-03 NOTE — ED Notes (Signed)
Pt reports feeling better and does not currently have any pain.

## 2013-01-03 NOTE — Progress Notes (Signed)
[name], NT attempted in and out cath, she met resistance, no urine drained, upon removal of catheter blood was inside catheter

## 2013-01-03 NOTE — Progress Notes (Addendum)
Patient received via stretcher from ED.  Patient very drowsy will arouse but does not answer questions fully.  Some blood drips, approximately 4 5o cent size spots on diaper,and also noticed small drip at end of penis, attempted to ask patient about this but he was unable to arouse enough to answer.  Condom catheter applied per Corie Chiquito, NT.  Placed on isolation for ESBL.  Spoke with patient's mother who verbalized her concern regarding his ability to communicate accurate information due to his brain injury.  Notified Dr. Waymon Amato of room number, blood and that mother would like to speak with him.  [name], patient's mother, lives 100 miles away and disabled herself.

## 2013-01-03 NOTE — Progress Notes (Addendum)
Attempted to meet with Pt.  Pt unable to awaken to participate in Ax, as he's recently been medicated.  CSW to try again later today.  CSW to continue to follow.  Providence Crosby, LCSWA Clinical Social Work [phone]

## 2013-01-03 NOTE — H&P (Signed)
[facility] History and Physical  [name] ZOX:[id number] DOB: 01-22-1966    PCP:   [name], MD   Chief Complaint: abdominal pain.  HPI: [name] is an 47 y.o. male with anoxic brain injury, Sz disorder, quadriplegia, mental disorder, hx of recurrent UTI, hyperlipidemia, Addison's disease, dysphagia with PEG tube, brought to the ER from NH with abdominal pain.  He really couldn't give any good history because of his brain injury, but it was felt that he had abdominal pain.  Evaluation in the ER showed leukocytosis with WBC of 16K,  UA only showed 02 WBCs and 02 RBC.  His abdominal CT showed PEG tube in place, and no acute abdominal processes.  He has normal renal fx tests, normal lipase, and normal electrolytes.  He was given pain meds, and hospitalist was asked to admit him for abdominal pain of unclear etiology.  Rewiew of Systems: His ROS is unreliable.    Past Medical History  Diagnosis Date  . Addison disease   . Anoxic brain injury   . A-fib   . Seizure disorder   . Dysphagia   . History of recurrent UTIs   . Hyperlipemia   . Aspiration pneumonia   . Mental disorder   . Anxiety   . Reflux   . Dysphagia   . Glucocorticoid deficiency   . Atrial fibrillation   . Glaucoma(365)   . Hypothyroidism   . Encephalopathy   . Quadriplegia   . Addison disease   . Myocardial infarction     Past Surgical History  Procedure Date  . Nephrectomy unknown  . Peg tube placement     Medications:  HOME MEDS: Prior to Admission medications   Medication Sig Start Date End Date Taking? Authorizing Provider  diphenhydrAMINE (BENADRYL) 25 mg capsule 25 mg by PEG Tube route every 6 (six) hours as needed. For allergic reaction   Yes Historical Provider, MD  feeding supplement (PRO-STAT SUGAR FREE 64) LIQD 30 mLs by PEG Tube route 2 (two) times daily.    Yes Historical Provider, MD  ferrous sulfate 220 (44 FE) MG/5ML solution 330 mg by PEG Tube route 3  (three) times daily with meals.    Yes Historical Provider, MD  fish oil-omega-3 fatty acids 1000 MG capsule 1 g by PEG Tube route 2 (two) times daily. Via peg tube   Yes Historical Provider, MD  ipratropium-albuterol (DUONEB) 0.5-2.5 (3) MG/3ML SOLN Take 3 mLs by nebulization every 6 (six) hours as needed. For shortness of breath   Yes Historical Provider, MD  methylcellulose (ARTIFICIAL TEARS) 1 % ophthalmic solution Place 1 drop into both eyes 4 (four) times daily.     Yes Historical Provider, MD  metoprolol tartrate (LOPRESSOR) 25 mg/10 mL SUSP 12.5 mg by PEG Tube route 2 (two) times daily.    Yes Historical Provider, MD  multivitamin/minerals (GOLDEN AGE) LIQD 5 mLs by PEG Tube route daily.   Yes Historical Provider, MD  nitrofurantoin, macrocrystal-monohydrate, (MACROBID) 100 MG capsule Take 100 mg by mouth 2 (two) times daily. For UTI. 12/27/12  Yes Historical Provider, MD  Nutritional Supplements (FEEDING SUPPLEMENT, VITAL 1.5 CAL,) LIQD Place 1,000 mLs into feeding tube every hour. 8ml/hr continously from 2000 to 0500.   Yes Historical Provider, MD  pantoprazole sodium (PROTONIX) 40 mg/20 mL PACK 40 mg by PEG Tube route daily at 12 noon.  11/30/11  Yes Novlet Adelina Mings, MD  polyethylene glycol (MIRALAX / GLYCOLAX) packet 17 g by PEG Tube route 2 (two)  times daily. Mix 17gm 4-8 ounces of liquid   Yes Historical Provider, MD  venlafaxine (EFFEXOR) 37.5 MG tablet 37.5 mg by PEG Tube route 2 (two) times daily.   Yes Historical Provider, MD     Allergies:  Allergies  Allergen Reactions  . Ativan (Lorazepam)     Unknown  . Latex Dermatitis  . Morphine And Related Other (See Comments)    UNKNOWN  . Penicillins Other (See Comments)    UNKNOWN  . Pyridium (Phenazopyridine Hcl) Other (See Comments)    UNKNOWN  . Soy Allergy Other (See Comments)    UNKNOWN    Social History:   reports that he has never smoked. He has never used smokeless tobacco. He reports that he does not drink alcohol  or use illicit drugs.  Family History: History reviewed. No pertinent family history.   Physical Exam: Filed Vitals:   01/03/13 0130 01/03/13 0312 01/03/13 0400 01/03/13 0539  BP:  101/69  101/71  Pulse: 108  81 83  Temp:      TempSrc:      Resp: 15 16 13 19   SpO2: 93% 98% 93% 98%   Blood pressure 101/71, pulse 83, temperature 98.2 F (36.8 C), temperature source Oral, resp. rate 19, SpO2 98.00%.  GEN:  Pleasant  patient lying in the stretcher in no acute distress; cooperative with exam. PSYCH:  ; does not appear anxious or depressed; affect is appropriate. HEENT: Mucous membranes pink and anicteric; PERRLA; EOM intact; no cervical lymphadenopathy nor thyromegaly or carotid bruit; no JVD; There were no stridor. Neck is very supple. Breasts:: Not examined CHEST WALL: No tenderness CHEST: Normal respiration, clear to auscultation bilaterally.  HEART: Regular rate and rhythm.  There are no murmur, rub, or gallops.   BACK: No kyphosis or scoliosis; no CVA tenderness ABDOMEN: soft and non-tender; no masses, no organomegaly, normal abdominal bowel sounds; no pannus; no intertriginous candida. There is no rebound and no distention. PEG tube in place Rectal Exam: Not done EXTREMITIES: No bone or joint deformity; age-appropriate arthropathy of the hands and knees; no edema; no ulcerations.  There is no calf tenderness. Genitalia: not examined PULSES: 2+ and symmetric SKIN: Normal hydration no rash or ulceration CNS: Cranial nerves 2-12 grossly intact no focal lateralizing neurologic deficit.  Speech is fluent; uvula elevated with phonation, facial symmetry and tongue midline.  Labs on Admission:  Basic Metabolic Panel:  Lab 01/02/13 4098  NA 133*  K 4.0  CL 99  CO2 20  GLUCOSE 80  BUN 18  CREATININE 0.95  CALCIUM 9.6  MG --  PHOS --   Liver Function Tests:  Lab 01/02/13 2230  AST 22  ALT 13  ALKPHOS 73  BILITOT 1.4*  PROT 7.6  ALBUMIN 4.0    Lab 01/02/13 2230    LIPASE 26  AMYLASE --   No results found for this basename: AMMONIA:5 in the last 168 hours CBC:  Lab 01/02/13 2230  WBC 16.1*  NEUTROABS 13.1*  HGB 16.2  HCT 45.4  MCV 86.1  PLT 138*   Cardiac Enzymes: No results found for this basename: CKTOTAL:5,CKMB:5,CKMBINDEX:5,TROPONINI:5 in the last 168 hours  CBG:  Lab 01/02/13 2040  GLUCAP 80     Radiological Exams on Admission: Ct Abdomen Pelvis W Contrast  01/03/2013  *RADIOLOGY REPORT*  Clinical Data: Pain.  Nausea and vomiting.  Elevated white blood cell count.  CT ABDOMEN AND PELVIS WITH CONTRAST  Technique:  Multidetector CT imaging of the abdomen and pelvis was  performed following the standard protocol during bolus administration of intravenous contrast.  Contrast: OMNIPAQUE IOHEXOL 300 MG/ML  SOLN  Comparison: CT abdomen and pelvis 12/26/2011.  Findings: Bibasilar atelectasis is identified.  No pleural or pericardial effusion is seen.  The patient is status post cholecystectomy.  Postoperative change of right nephrectomy is seen as on the prior exam. There is compensatory hypertrophy of the left kidney. Small left renal cyst is noted.  A feeding tube is in place. Laxity of the right abdominal wall is unchanged.  The spleen and pancreas are unremarkable.  Mild biliary ductal prominence is unchanged.  No lymphadenopathy or fluid is identified.  The stomach and small and large bowel appear normal.  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  No worrisome bony finding is identified.  IMPRESSION:  1.  No acute finding. 2.  Status post cholecystectomy and right nephrectomy. 3.  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   Original Report Authenticated By: Holley Dexter, M.D.    Dg Abd Acute W/chest  01/02/2013  *RADIOLOGY REPORT*  Clinical Data: Abdominal pain.  ACUTE ABDOMEN SERIES (ABDOMEN 2 VIEW &  CHEST 1 VIEW)  Comparison: CT abdomen and pelvis and chest and two views abdomen 12/26/2011.  Findings: Single view of the chest demonstrates clear lungs and a normal heart size.  Elevation right hemidiaphragm is again seen as on the prior studies.  Two views of the abdomen show no free intraperitoneal air.  Feeding tube is noted.  Bowel gas pattern is unremarkable.  IMPRESSION: No acute finding chest or abdomen.   Original Report Authenticated By: Holley Dexter, M.D.      Assessment/Plan Present on Admission:  . Abdominal pain . Chronic constipation . Anoxic brain injury . FTT (failure to thrive) in adult . Mental disorder . Addisons disease  PLAN: Given his anoxic brain injury and mental disorder, it is very difficult to determine what exactly is going on with him.  He could have passed a kidney stone, or having abdominal gas pain.  His abdominal CT is benign as well as his lipase and general labs.  I will admit him to OBS to be sure he doesn't have recurrence of his abdominal pain.  Will continue his meds.  I will restart him back on tube feed at 30cc/ hour only to see if he can tolerate it.  With his Addison's disease, his BP and heart rate has been stable.  He is a full code and will be admitted to [facility] service.  Other plans as per orders.  Code Status: FULL Unk Lightning, MD. [facility] Pager [phone] 7pm to 7am.  01/03/2013, 6:07 AM

## 2013-01-03 NOTE — Progress Notes (Signed)
Updated patient's mother Talbert Forest regarding his status at this time.  Informed her that the doctor has her number and will attempt to reach her later today.

## 2013-01-03 NOTE — Progress Notes (Signed)
Clinical Social Work Department BRIEF PSYCHOSOCIAL ASSESSMENT 01/03/2013  Patient:  [name], [name]     Account Number:  [id number]     Admit date:  01/02/2013  Clinical Social Worker:  Doroteo Glassman  Date/Time:  01/03/2013 10:04 AM  Referred by:  Physician  Date Referred:  01/03/2013 Referred for  SNF Placement   Other Referral:   Interview type:  Other - See comment Other interview type:   Pt's mom via phone    PSYCHOSOCIAL DATA Living Status:  FACILITY Admitted from facility:  [facility] Level of care:  Skilled Nursing Facility Primary support name:  Mrs. Holtry Primary support relationship to patient:  PARENT Degree of support available:   adequate    CURRENT CONCERNS Current Concerns  Post-Acute Placement   Other Concerns:    SOCIAL WORK ASSESSMENT / PLAN Pt unable to participate in Ax.    Spoke with Pt's mom via phone.    Pt's mom stated that Pt is a long-term care resident of [facility].  She stated that she's made attempts to have him closer to her but that his medical conditions are such that SNFs are reluctant to consider him.    Pt's mom stated that she'd like for Pt to return to [facility] upon d/c.    CSW thanked Mrs. Mcilvain for her time.    Spoke with Marylene Land at [facility]    Marylene Land stated that Pt can return upon d/c.    CSW thanked Marylene Land for her time.   Assessment/plan status:  Psychosocial Support/Ongoing Assessment of Needs Other assessment/ plan:   Information/referral to community resources:   n/a    PATIENT'S/FAMILY'S RESPONSE TO PLAN OF CARE: Mrs. Alvester Morin thanked CSW for time and assistance.    CSW to continue to follow.  Providence Crosby, LCSWA Clinical Social Work [phone]

## 2013-01-03 NOTE — ED Notes (Addendum)
Pt thrashing around. Pt given valium and pt calmed down. Vitals signs improved.  Earlier pt's O2 sat dropped to the mid 80s.  Pt also c/o of some difficulty breathing.  Pt put on 2L [place] and sats improved to 97%.

## 2013-01-03 NOTE — Progress Notes (Signed)
01-03-13 2330  NSG:  Called to place a Coude cath in this pt D/T no urine output this shift and bladder scan reveals greater than 500 cc's urine.  Order for coude noted however order is for 14 FR and all the hospital has available is a 16 FR. RN for pt states that placing a 16 is okay and that doctor is aware.  Pt lying on side and in NAD when I entered.  Procedure explained, pt denies questions and follows directions for me during insertion well.  Prior to placing cath blood noted smeared over top of pt thighs and small amount underneath pt.  Coude placed without any resistance, first approx 200 cc's not caught and noted to have a pink tinge.  After attaching cath to collection bag 300 immediate clear, amber urine noted, NO CLOTS or sediment noted.   Tube secured to top of thigh with leg strap, bed changed, and report given to RN.  Alarm placed, CB in reach, and side rails up.

## 2013-01-03 NOTE — ED Notes (Signed)
Pt bed has changed to 5 east report attempted nurse in meeting will call me back

## 2013-01-03 NOTE — Progress Notes (Addendum)
[facility] PROGRESS NOTE  Adar Rase ZOX:[id number] DOB: 1966/01/31 DOA: 01/02/2013 PCP: [name], MD  Brief narrative 47 year old male with history of anoxic brain injury, seizure disorder, recurrent UTI, HL, Addison's disease, PEG tube was sent from NH and admitted on 1/7 due to abdominal pain. Patient cannot provide history secondary to his mental status changes. Evaluation in the ED showed leukocytosis/16 K., non-impressive UA, CT abdomen without acute findings, normal BMP and lipase. The hospitalist service was requested to admit for further evaluation and management. Per nurses discussion with nursing facility of later, patient apparently was transferred because of headache.  Assessment/Plan:  Possible Headache  Not sure we are patient exactly has pain. Sometimes he indicates abdominal pain and other times complains of headache. He does not look septic or toxic.  Neck supple.  Will get CT head to reevaluate.  Pain medications and monitor.  Possible abdominal pain  CT abdomen without acute findings. Unclear etiology. UA not impressive. Hold off on antibiotics.  Mild hematuria at tip of penis-less likely to because of her symptoms  Resume patient's feeds-by mouth in the day and by PEG at night.  ? Mild GE-but no history of nausea, vomiting or diarrhea.  Mild hematuria  Unclear etiology. Monitor closely. If does not clear or worsens-consider urology consultation.  Recently completed course of antibiotics for UTI.  Brief IV fluids. Check bladder scan.  Anoxic brain injury  Per nurses discussion with NH, patient ambulates, oriented only to self and uses curse words a lot.  No focal deficits on exam.  Sleeping but easily arousable (probably secondary to Valium) and oriented to self  H/O Recurrent UTI  Continue Macrodantin  Leukocytosis  ? Stress response. Follow up CBC in a.m. Also mildly thrombocytopenic.  Code Status: Full Family  Communication: Discussed with mother via phone. Disposition Plan: Back to NH when stable   Consultants:  None  Procedures:  None  Antibiotics:  None  HPI/Subjective: Headache and abdominal pain. Couple of drops of bright red blood at tip of penis.  Objective: Filed Vitals:   01/03/13 0400 01/03/13 0539 01/03/13 0950 01/03/13 1444  BP:  101/71 113/82 115/74  Pulse: 81 83 65 66  Temp:   97.2 F (36.2 C) 97.4 F (36.3 C)  TempSrc:   Oral Axillary  Resp: 13 19 16 18   SpO2: 93% 98% 97% 96%    Intake/Output Summary (Last 24 hours) at 01/03/13 1824 Last data filed at 01/03/13 1821  Gross per 24 hour  Intake    570 ml  Output     20 ml  Net    550 ml   There were no vitals filed for this visit.  Exam:   General exam: Appears comfortable.  Respiratory: Clear to auscultation. No increased work of breathing.  Cardiovascular system: First and second heart sounds heard, regular rate and rhythm. No JVD or murmurs or pedal edema.  Gastrointestinal system: Abdomen is nondistended, soft and diffuse mild tenderness without rigidity, guarding or rebound. Normal bowel sounds heard.  External genitalia: Circumcised. One or 2 drops of bright red blood at tip of penis but no other acute findings.  Central nervous system: Alert and oriented only to self. No focal neurological deficits.  Extremities: Symmetric 5 x 5 power.  Data Reviewed: Basic Metabolic Panel:  Lab 01/02/13 8119  NA 133*  K 4.0  CL 99  CO2 20  GLUCOSE 80  BUN 18  CREATININE 0.95  CALCIUM 9.6  MG --  PHOS --  Liver Function Tests:  Lab 01/02/13 2230  AST 22  ALT 13  ALKPHOS 73  BILITOT 1.4*  PROT 7.6  ALBUMIN 4.0    Lab 01/02/13 2230  LIPASE 26  AMYLASE --   No results found for this basename: AMMONIA:5 in the last 168 hours CBC:  Lab 01/02/13 2230  WBC 16.1*  NEUTROABS 13.1*  HGB 16.2  HCT 45.4  MCV 86.1  PLT 138*   Cardiac Enzymes: No results found for this basename:  CKTOTAL:5,CKMB:5,CKMBINDEX:5,TROPONINI:5 in the last 168 hours BNP (last 3 results) No results found for this basename: PROBNP:3 in the last 8760 hours CBG:  Lab 01/02/13 2040  GLUCAP 80    No results found for this or any previous visit (from the past 240 hour(s)).   Studies: Ct Abdomen Pelvis W Contrast  01/03/2013  *RADIOLOGY REPORT*  Clinical Data: Pain.  Nausea and vomiting.  Elevated white blood cell count.  CT ABDOMEN AND PELVIS WITH CONTRAST  Technique:  Multidetector CT imaging of the abdomen and pelvis was performed following the standard protocol during bolus administration of intravenous contrast.  Contrast: OMNIPAQUE IOHEXOL 300 MG/ML  SOLN  Comparison: CT abdomen and pelvis 12/26/2011.  Findings: Bibasilar atelectasis is identified.  No pleural or pericardial effusion is seen.  The patient is status post cholecystectomy.  Postoperative change of right nephrectomy is seen as on the prior exam. There is compensatory hypertrophy of the left kidney. Small left renal cyst is noted.  A feeding tube is in place. Laxity of the right abdominal wall is unchanged.  The spleen and pancreas are unremarkable.  Mild biliary ductal prominence is unchanged.  No lymphadenopathy or fluid is identified.  The stomach and small and large bowel appear normal.  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  No worrisome bony finding is identified.  IMPRESSION:  1.  No acute finding. 2.  Status post cholecystectomy and right nephrectomy. 3.  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   Original Report Authenticated By: Holley Dexter, M.D.    Dg Abd Acute W/chest  01/02/2013  *RADIOLOGY REPORT*  Clinical Data: Abdominal pain.  ACUTE ABDOMEN SERIES (ABDOMEN 2 VIEW & CHEST 1 VIEW)  Comparison: CT abdomen and pelvis and chest and two views abdomen 12/26/2011.  Findings: Single  view of the chest demonstrates clear lungs and a normal heart size.  Elevation right hemidiaphragm is again seen as on the prior studies.  Two views of the abdomen show no free intraperitoneal air.  Feeding tube is noted.  Bowel gas pattern is unremarkable.  IMPRESSION: No acute finding chest or abdomen.   Original Report Authenticated By: Holley Dexter, M.D.     Scheduled Meds:    . enoxaparin (LOVENOX) injection  40 mg Subcutaneous Daily  . feeding supplement  30 mL Per Tube BID  . feeding supplement (VITAL 1.5 CAL)  1,000 mL Per Tube Q24H  . free water  30 mL Per Tube Q6H  .  HYDROmorphone (DILAUDID) injection  1 mg Intravenous Once  . metoprolol tartrate  12.5 mg Per Tube BID  . multivitamin  5 mL Per Tube Daily  . nitrofurantoin (macrocrystal-monohydrate)  100 mg Oral BID  . pantoprazole sodium  40 mg Per Tube Daily  . polyethylene glycol  17 g Per Tube BID  . polyvinyl alcohol  1 drop Both Eyes QID  . sodium chloride  3 mL Intravenous Q12H  . venlafaxine  37.5 mg Oral BID WC   Continuous Infusions:    . sodium chloride Stopped (01/03/13 0429)    Principal Problem:  *Abdominal pain Active Problems:  Anoxic brain injury  History of atrial fibrillation  Chronic constipation  FTT (failure to thrive) in adult  Mental disorder  Addisons disease  Dysphagia    Time spent: 25 minutes    [facility]  [facility] Pager [phone]. If 8PM-8AM, please contact night-coverage at [email or web address], password [facility] 01/03/2013, 6:24 PM  LOS: 1 day

## 2013-01-03 NOTE — ED Notes (Signed)
Assessed the pt's response to pain, pt grimaced but when asked for further detail about his pain and a pain score, pt unable to give a response and continued to rest.

## 2013-01-04 DIAGNOSIS — N179 Acute kidney failure, unspecified: Secondary | ICD-10-CM

## 2013-01-04 DIAGNOSIS — E2749 Other adrenocortical insufficiency: Secondary | ICD-10-CM

## 2013-01-04 LAB — CBC
HCT: 43 % (ref 39.0–52.0)
MCH: 30.5 pg (ref 26.0–34.0)
MCHC: 34.4 g/dL (ref 30.0–36.0)
RDW: 12.6 % (ref 11.5–15.5)

## 2013-01-04 LAB — BASIC METABOLIC PANEL
BUN: 21 mg/dL (ref 6–23)
Calcium: 9.3 mg/dL (ref 8.4–10.5)
GFR calc Af Amer: >90 mL/min (ref >90)
GFR calc non Af Amer: >90 mL/min (ref >90)
Potassium: 3.9 mEq/L (ref 3.5–5.1)

## 2013-01-04 MED ORDER — RESOURCE THICKENUP CLEAR PO POWD
ORAL | Status: DC | PRN
  Filled 2013-01-04: qty 125

## 2013-01-04 NOTE — Progress Notes (Signed)
Patient alert and oriented, informed from staff that patient has been verbally aggressive and very inappropriate, stating sexual jester towards male staff (Cythnia White NT). When patient was asked not to speak to staff in that manner, patient began calling me ( Nurse Afton Mikelson) boy and nigger telling me that I needed to get out of his room, at one moment the patient even made a movement towards me with verbal and aggressive hand jesters. This patient has a history of TBI but patient was able to make cognitive changes in his behaviors when it was appropriate for him. This patient was able to choose who gave him care by ethnic group for when one group of staff members gave care there was no behavior problems, but as soon as the opposite group gave care the patient became very aggressive and sexual with male staff. Manager informed of this patient behavior and when she went to speak with patient she was received with the same behavior verbal abuse and disrespect. Decision made to switch Nursing staff from one ethnic group to another. Patient in stable condition at this time

## 2013-01-04 NOTE — Evaluation (Signed)
Physical Therapy Evaluation Patient Details Name: [name] MRN: [id number] DOB: 1966-12-17 Today's Date: 01/04/2013 Time: 5621-3086 PT Time Calculation (min): 11 min  PT Assessment / Plan / Recommendation Clinical Impression  Pt. was admitted 01/02/13 from SNF with c/o abdominal pain, Pt. has a h/o anoxic brain injury, PEG, seizure d/o. Pt has had episodes of inappropriate behavior Per Staff. Pt. was not inapprorpriate for eval, pt. Is slow to respond. Pt will  benefit from PT while in acute care. Information is given that pt. was ambulatory at SNF.    PT Assessment  Patient needs continued PT services    Follow Up Recommendations  No PT follow up (will see how pt is progressing.)    Does the patient have the potential to tolerate intense rehabilitation      Barriers to Discharge        Equipment Recommendations  None recommended by PT    Recommendations for Other Services     Frequency Min 3X/week    Precautions / Restrictions Precautions Precautions: Fall Precaution Comments: known outbursts and inappropriate behavior. Restrictions Weight Bearing Restrictions: No   Pertinent Vitals/Pain States abdomen hurts.      Mobility  Bed Mobility Bed Mobility: Rolling Right;Sit to Sidelying Right;Right Sidelying to Sit Rolling Right: 6: Modified independent (Device/Increase time) Right Sidelying to Sit: 5: Supervision;With rails Sit to Sidelying Right: 7: Independent Transfers Transfers: Sit to Stand;Stand to Sit Sit to Stand: 1: +2 Total assist;From bed Sit to Stand: Patient Percentage: 60% Stand to Sit: 1: +2 Total assist;To bed Stand to Sit: Patient Percentage: 70% Ambulation/Gait Ambulation/Gait Assistance: 1: +2 Total assist Ambulation/Gait: Patient Percentage: 60%    Shoulder Instructions     Exercises     PT Diagnosis: Difficulty walking;Altered mental status  PT Problem List: Decreased activity tolerance;Decreased cognition;Decreased safety  awareness;Decreased knowledge of use of DME;Decreased mobility PT Treatment Interventions: DME instruction;Functional mobility training;Gait training;Therapeutic activities   PT Goals Acute Rehab PT Goals PT Goal Formulation: Patient unable to participate in goal setting Time For Goal Achievement: 01/18/13 Potential to Achieve Goals: Good Pt will go Supine/Side to Sit: with supervision PT Goal: Supine/Side to Sit - Progress: Goal set today Pt will go Sit to Supine/Side: with supervision PT Goal: Sit to Supine/Side - Progress: Goal set today Pt will Ambulate: >150 feet;with supervision;with least restrictive assistive device PT Goal: Ambulate - Progress: Goal set today  Visit Information  Last PT Received On: 01/04/13 Assistance Needed: +2    Subjective Data  Subjective: my stomach Patient Stated Goal: pt did not state   Prior Functioning  Home Living Available Help at Discharge: Skilled Nursing Facility Additional Comments: RN states facility reports pt. was ambulatory PTA, unknown level of function for all ADL's Prior Function Comments: info incomplete, pt unable to provide    Cognition  Overall Cognitive Status: History of cognitive impairments - further impaired Area of Impairment: Following commands Arousal/Alertness: Awake/alert Orientation Level:  (oriented to hospital and self) Behavior During Session: Flat affect Following Commands: Follows one step commands inconsistently (with cues, will follow, delayed to follow commands.)    Extremity/Trunk Assessment Right Upper Extremity Assessment RUE ROM/Strength/Tone: Deficits RUE ROM/Strength/Tone Deficits: grossly WFL Left Upper Extremity Assessment LUE ROM/Strength/Tone: Deficits LUE ROM/Strength/Tone Deficits: grossly WFL Right Lower Extremity Assessment RLE ROM/Strength/Tone: Deficits RLE ROM/Strength/Tone Deficits: grossly WFL to stand at bedside. Left Lower Extremity Assessment LLE ROM/Strength/Tone:  Deficits LLE ROM/Strength/Tone Deficits: same as R   Balance Static Sitting Balance Static Sitting - Balance Support: Bilateral upper  extremity supported;Feet unsupported Static Sitting - Level of Assistance: 5: Stand by assistance  End of Session PT - End of Session Activity Tolerance: Patient limited by pain Patient left: in bed;with call bell/phone within reach Nurse Communication: Mobility status  GP Functional Assessment Tool Used: clinical reasoning Functional Limitation: Mobility: Walking and moving around Mobility: Walking and Moving Around Current Status ([id number]): At least 40 percent but less than 60 percent impaired, limited or restricted Mobility: Walking and Moving Around Goal Status [phone]): At least 1 percent but less than 20 percent impaired, limited or restricted   Rada Hay 01/04/2013, 1:23 PM

## 2013-01-04 NOTE — Progress Notes (Signed)
Patient ID: [name], male   DOB: 12-30-1965, 47 y.o.   MRN: [id number]  [facility] PROGRESS NOTE  [name] [name]:[id number] DOB: 13-Nov-1966 DOA: 01/02/2013 PCP: [name], MD  Brief narrative  47 year old male with history of anoxic brain injury, seizure disorder, recurrent UTI, HL, Addison's disease, PEG tube was sent from NH and admitted on 1/7 due to abdominal pain. Patient cannot provide history secondary to his mental status changes. Evaluation in the ED showed leukocytosis/16 K., non-impressive UA, CT abdomen without acute findings, normal BMP and lipase. The hospitalist service was requested to admit for further evaluation and management. Per nurses discussion with nursing facility, patient apparently was transferred because of headache.   Assessment/Plan:   Possible Headache  Unclear etiology but pt denies headache this AM Neck supple.  CT head unremarkable for acute events.  Possible abdominal pain  CT abdomen without acute findings. Unclear etiology. UA not impressive. Hold off on antibiotics.  Pt denies pain this AM Mild hematuria at tip of penis still present Resume patient's feeds-by mouth in the day and by PEG at night.  Mild hematuria  Unclear etiology. Monitor closely. If does not clear or worsens will consider urology consultation.  Recently completed course of antibiotics for UTI.  Anoxic brain injury  Per nurses discussion with NH, patient ambulates, oriented only to self and uses curse words a lot.  No focal deficits on exam.  Pleasant on exam this AM. H/O Recurrent UTI  Continue Macrodantin Leukocytosis  ? Stress response. WBC within normal limits this AM Pt remains afebrile iver 24 hours  Code Status: Full  Disposition Plan: Back to NH in AM  Consultants:  None Procedures:  Ct Head Wo Contrast 01/03/2013   Negative CT head.  No interval change.      Ct Abdomen Pelvis W Contrast 01/03/2013   1.  No acute finding.  2.  Status  post cholecystectomy and right nephrectomy.  3.  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    Dg Abd Acute W/chest 01/02/2013   No acute finding chest or abdomen.     Antibiotics:  None  HPI/Subjective: No events overnight.   Objective: Filed Vitals:   01/03/13 0539 01/03/13 0950 01/03/13 1444 01/04/13 1412  BP: 101/71 113/82 115/74 95/64  Pulse: 83 65 66 91  Temp:  97.2 F (36.2 C) 97.4 F (36.3 C) 98.9 F (37.2 C)  TempSrc:  Oral Axillary Oral  Resp: 19 16 18 18   SpO2: 98% 97% 96% 96%    Intake/Output Summary (Last 24 hours) at 01/04/13 1432 Last data filed at 01/04/13 1413  Gross per 24 hour  Intake    360 ml  Output    200 ml  Net    160 ml    Exam:   General:  Pt is awake, follows commands appropriatelly  Cardiovascular: Regular rate and rhythm, S1/S2, no murmurs, no rubs, no gallops  Respiratory: Clear to auscultation bilaterally, decreased breath sounds at bases   Abdomen: Soft, non tender, non distended, bowel sounds present, no guarding  Extremities: No edema, pulses DP and PT palpable bilaterally  Data Reviewed: Basic Metabolic Panel:  Lab 01/04/13 9562 01/02/13 2230  NA 139 133*  K 3.9 4.0  CL 105 99  CO2 22 20  GLUCOSE 91 80  BUN 21 18  CREATININE 0.84 0.95  CALCIUM 9.3 9.6  MG -- --  PHOS -- --   Liver Function Tests:  Lab 01/02/13 2230  AST 22  ALT 13  ALKPHOS 73  BILITOT 1.4*  PROT 7.6  ALBUMIN 4.0    Lab 01/02/13 2230  LIPASE 26  AMYLASE --   CBC:  Lab 01/04/13 0542 01/02/13 2230  WBC 5.9 16.1*  NEUTROABS -- 13.1*  HGB 14.8 16.2  HCT 43.0 45.4  MCV 88.5 86.1  PLT 108* 138*   CBG:  Lab 01/02/13 2040  GLUCAP 80   Scheduled Meds:   . enoxaparin injection  40 mg Subcutaneous Daily  . feeding supplement  30 mL Per Tube BID  . HYDROmorphone  inj  1 mg Intravenous Once  . metoprolol tartrate  12.5 mg Per Tube BID  . multivitamin  5 mL Per Tube  Daily  . nitrofurantoin  100 mg Oral BID  . pantoprazole sodium  40 mg Per Tube Daily  . polyethylene glycol  17 g Per Tube BID  . polyvinyl alcohol  1 drop Both Eyes QID  . venlafaxine  37.5 mg Oral BID WC   Continuous Infusions:   . sodium chloride 1,000 mL (01/03/13 1855)     Debbora Presto, MD  [facility] Pager [phone]  If 7PM-7AM, please contact night-coverage [email or web address] Password [email or web address] 01/04/2013, 2:32 PM   LOS: 2 days

## 2013-01-05 LAB — CBC
Hemoglobin: 14 g/dL (ref 13.0–17.0)
MCHC: 33.8 g/dL (ref 30.0–36.0)
Platelets: 129 10*3/uL — ABNORMAL LOW (ref 150–400)
RDW: 12.6 % (ref 11.5–15.5)

## 2013-01-05 LAB — BASIC METABOLIC PANEL
Calcium: 9.1 mg/dL (ref 8.4–10.5)
GFR calc Af Amer: >90 mL/min (ref >90)
GFR calc non Af Amer: >90 mL/min (ref >90)
Potassium: 3.7 mEq/L (ref 3.5–5.1)
Sodium: 145 mEq/L (ref 135–145)

## 2013-01-05 MED ORDER — HYDROMORPHONE HCL PF 1 MG/ML IJ SOLN
1.0000 mg | Freq: Once | INTRAMUSCULAR | Status: AC
  Administered 2013-01-05: 1 mg via INTRAVENOUS

## 2013-01-05 MED ORDER — OXYCODONE-ACETAMINOPHEN 5-325 MG/5ML PO SOLN
5.0000 mL | Freq: Once | ORAL | Status: AC
  Administered 2013-01-05: 5 mL via ORAL
  Filled 2013-01-05: qty 5

## 2013-01-05 NOTE — Discharge Summary (Addendum)
Physician Discharge Summary  [name] RUE:[id number] DOB: 03/22/1966 DOA: 01/02/2013  PCP: Steven Sleeper, MD  Admit date: 01/02/2013 Discharge date: 01/05/2013  Recommendations for Outpatient Follow-up:  1. Pt will need to follow up with PCP in 2-3 weeks post discharge 2. Please obtain BMP to evaluate electrolytes and kidney function 3. Please also check CBC to evaluate Hg and Hct levels 4. Please refer pt to urologist if hematuria present, as this has resolved during the hospital stay we have not consulted urology, defer to outpatient management if indicated  5. Please encourage oral intake, fluids emphasized   Discharge Diagnoses:  Principal Problem:  *Abdominal pain Active Problems:  Anoxic brain injury  History of atrial fibrillation  Chronic constipation  FTT (failure to thrive) in adult  Mental disorder  Addisons disease  Dysphagia  Discharge Condition: Stable  Diet recommendation: Heart healthy diet discussed in details   Brief narrative  47 year old male with history of anoxic brain injury, seizure disorder, recurrent UTI, HL, Addison's disease, PEG tube was sent from NH and admitted on 1/7 due to abdominal pain. Patient cannot provide history secondary to his mental status changes. Evaluation in the ED showed leukocytosis/16 K., non-impressive UA, CT abdomen without acute findings, normal BMP and lipase. The hospitalist service was requested to admit for further evaluation and management. Per nurses discussion with nursing facility, patient apparently was transferred because of headache.   Assessment/Plan:  Possible Headache  Unclear etiology but pt denies headache this AM  Neck supple.  CT head unremarkable for acute events.  Possible abdominal pain  CT abdomen without acute findings. Unclear etiology. UA not impressive. Hold off on antibiotics.  Pt denies pain this AM  Mild hematuria at tip of penis still present  Resume patient's feeds-by mouth in the  day and by PEG at night.  Mild hematuria  Unclear etiology. Monitor closely Completed course of antibiotics for UTI.  Anoxic brain injury  Per nurses discussion with NH, patient ambulates, oriented only to self and uses curse words a lot.  This has been noted during the hospital stay with hospital staff  No focal deficits on exam. Pt appears stable H/O Recurrent UTI  Completed therapy and no indication to continue at this time  Leukocytosis  ? Stress response. WBC within normal limits this AM  Pt remains afebrile iver 24 hours No indication for ABX at this time, please note that pt has completed therapy with Nitrofurantoin   Code Status: Full  Disposition Plan: Back to NH today  Consultants:  None Procedures:  Ct Head Wo Contrast  01/03/2013  Negative CT head. No interval change.  Ct Abdomen Pelvis W Contrast  01/03/2013  1. No acute finding.  2. Status post cholecystectomy and right nephrectomy.  3. 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  Dg Abd Acute W/chest  01/02/2013  No acute finding chest or abdomen.  Antibiotics:  None  Discharge Exam: Filed Vitals:   01/05/13 0944  BP: 110/70  Pulse: 84  Temp:   Resp:    Filed Vitals:   01/04/13 1412 01/04/13 2256 01/05/13 0552 01/05/13 0944  BP: 95/64 114/81 93/55 110/70  Pulse: 91 75 86 84  Temp: 98.9 F (37.2 C) 99.8 F (37.7 C) 98.1 F (36.7 C)   TempSrc: Oral Oral Oral   Resp: 18 18 20    SpO2: 96% 96% 95%     General: Pt is awake, follows commands appropriately,  Cardiovascular: Regular rate  and rhythm, S1/S2, no murmurs, no rubs, no gallops  Respiratory: Clear to auscultation bilaterally, decreased breath sounds at bases  Abdomen: Soft, non tender, non distended, bowel sounds present, no guarding  Extremities: No edema, pulses DP and PT palpable bilaterally  Discharge Instructions  Discharge Orders    Future Orders Please Complete By Expires    Diet - low sodium heart healthy      Scheduling Instructions:   Feed as pt able to tolerate. PEG feeds at night time   Increase activity slowly          Medication List     As of 01/05/2013  9:52 AM    STOP taking these medications         nitrofurantoin (macrocrystal-monohydrate) 100 MG capsule   Commonly known as: MACROBID      TAKE these medications         diphenhydrAMINE 25 mg capsule   Commonly known as: BENADRYL   25 mg by PEG Tube route every 6 (six) hours as needed. For allergic reaction      feeding supplement (VITAL 1.5 CAL) Liqd   Place 1,000 mLs into feeding tube every hour. 46ml/hr continously from 2000 to 0500.      feeding supplement Liqd   30 mLs by PEG Tube route 2 (two) times daily.      ferrous sulfate 220 (44 FE) MG/5ML solution   330 mg by PEG Tube route 3 (three) times daily with meals.      fish oil-omega-3 fatty acids 1000 MG capsule   1 g by PEG Tube route 2 (two) times daily. Via peg tube      ipratropium-albuterol 0.5-2.5 (3) MG/3ML Soln   Commonly known as: DUONEB   Take 3 mLs by nebulization every 6 (six) hours as needed. For shortness of breath      methylcellulose 1 % ophthalmic solution   Commonly known as: ARTIFICIAL TEARS   Place 1 drop into both eyes 4 (four) times daily.      metoprolol tartrate 25 mg/10 mL Susp   Commonly known as: LOPRESSOR   12.5 mg by PEG Tube route 2 (two) times daily.      multivitamin/minerals Liqd   5 mLs by PEG Tube route daily.      pantoprazole sodium 40 mg/20 mL Pack   Commonly known as: PROTONIX   40 mg by PEG Tube route daily at 12 noon.      polyethylene glycol packet   Commonly known as: MIRALAX / GLYCOLAX   17 g by PEG Tube route 2 (two) times daily. Mix 17gm 4-8 ounces of liquid      venlafaxine 37.5 MG tablet   Commonly known as: EFFEXOR   37.5 mg by PEG Tube route 2 (two) times daily.           Follow-up Information    Follow up with Steven Sleeper, MD. In 1 week.    Contact information:   [address] [place] [address] [phone]           The results of significant diagnostics from this hospitalization (including imaging, microbiology, ancillary and laboratory) are listed below for reference.     Microbiology: No results found for this or any previous visit (from the past 240 hour(s)).   Labs: Basic Metabolic Panel:  Lab 01/05/13 9147 01/04/13 0542 01/02/13 2230  NA 145 139 133*  K 3.7 3.9 4.0  CL 111 105 99  CO2 24 22 20  GLUCOSE 86 91 80  BUN 19 21 18   CREATININE 0.81 0.84 0.95  CALCIUM 9.1 9.3 9.6  MG -- -- --  PHOS -- -- --   Liver Function Tests:  Lab 01/02/13 2230  AST 22  ALT 13  ALKPHOS 73  BILITOT 1.4*  PROT 7.6  ALBUMIN 4.0    Lab 01/02/13 2230  LIPASE 26  AMYLASE --   CBC:  Lab 01/05/13 0450 01/04/13 0542 01/02/13 2230  WBC 7.3 5.9 16.1*  NEUTROABS -- -- 13.1*  HGB 14.0 14.8 16.2  HCT 41.4 43.0 45.4  MCV 87.9 88.5 86.1  PLT 129* 108* 138*   CBG:  Lab 01/02/13 2040  GLUCAP 80   SIGNED: Time coordinating discharge: Over 30 minutes  Debbora Presto, MD  [facility] 01/05/2013, 9:52 AM Pager [phone]  If 7PM-7AM, please contact night-coverage [email or web address] Password [email or web address]

## 2013-01-05 NOTE — Progress Notes (Signed)
Per MD, Pt ready for d/c.  Notified RN, Pt, family and facility.  Sent d/c summary.  Confirmed receipt of d/c summary.  Facility ready to receive Pt.  Arranged for transportation.  Amanda Arvind Mexicano, LCSWA Clinical Social Work [phone]   

## 2013-01-05 NOTE — Progress Notes (Signed)
Patient discharged back to [facility] nursing facility.  All belongings with patient.  Report given to [facility] RN at facility.  No further questions at this time.  IV removed from left little finger, foley catheter discontinued.  Patient taken to [facility] via ambulance.  Patient discharged.

## 2013-02-04 IMAGING — CR DG ABDOMEN ACUTE W/ 1V CHEST
7 series · 7 of 7 positions shown · non-contrast
Comparison: CT abdomen and pelvis and chest and two views abdomen
12/26/2011.

CLINICAL DATA: Abdominal pain.

ACUTE ABDOMEN SERIES (ABDOMEN 2 VIEW & CHEST 1 VIEW)

[w abdomen decub (1 of 2)]
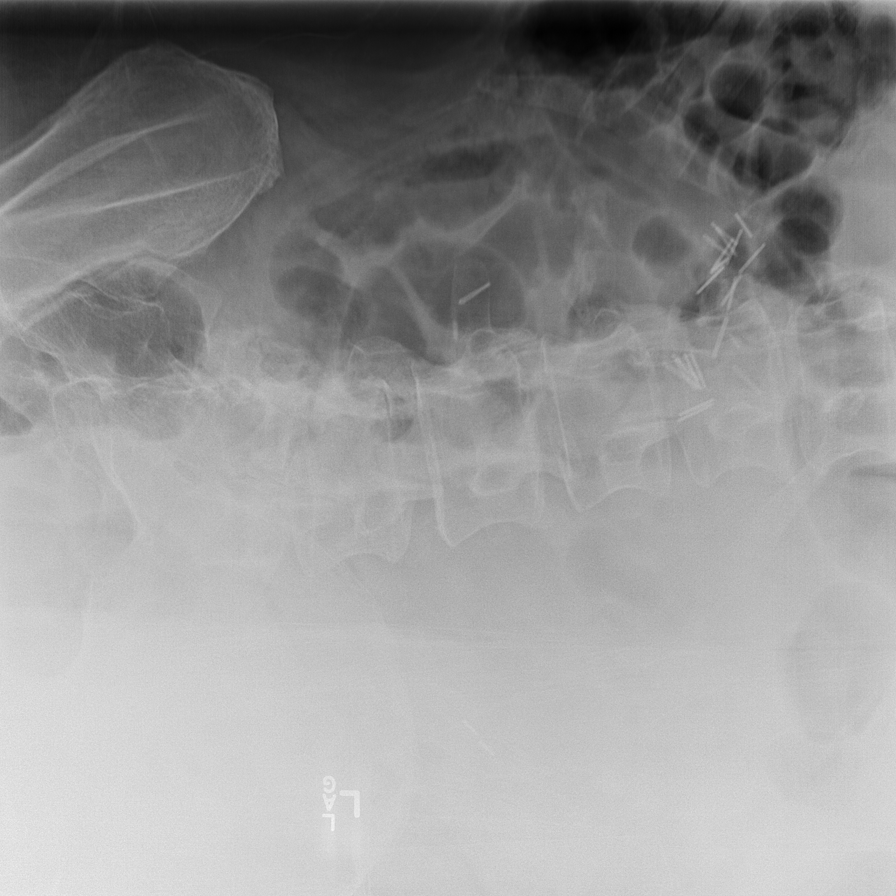

[w abdomen decub (2 of 2)]
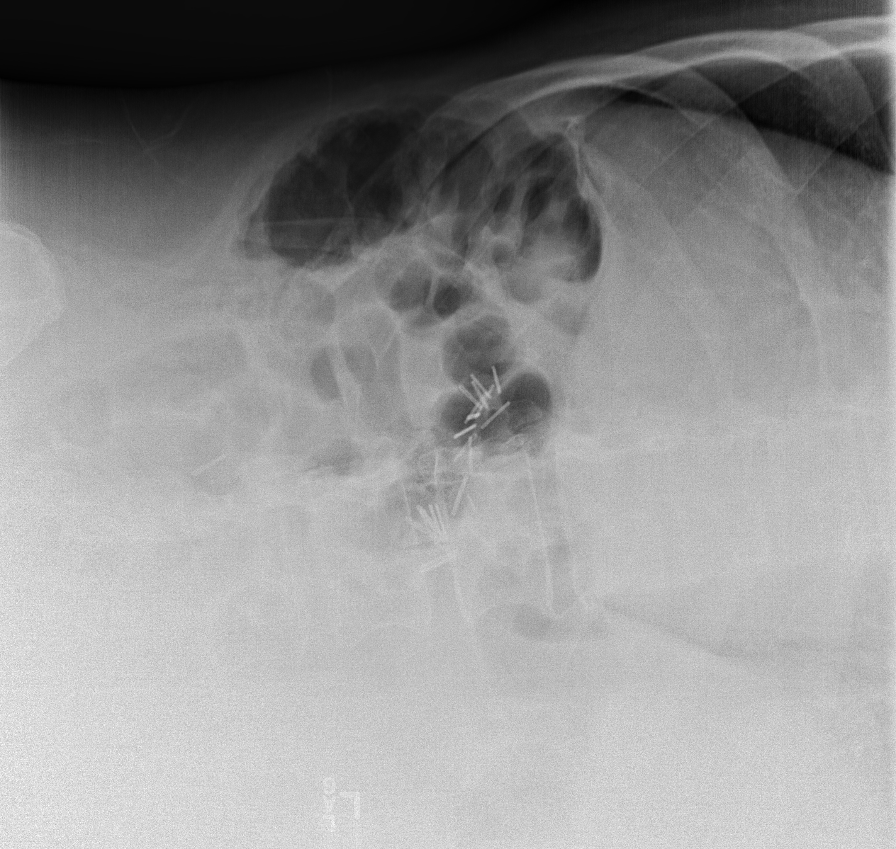

[t abdomen supine (1 of 3)]
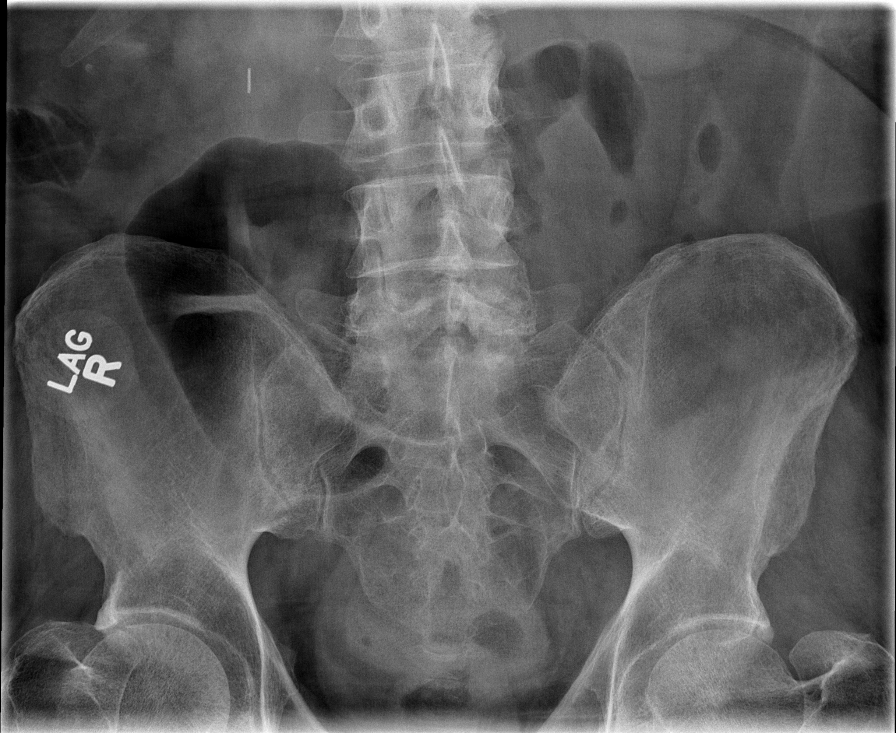

[t abdomen supine (2 of 3)]
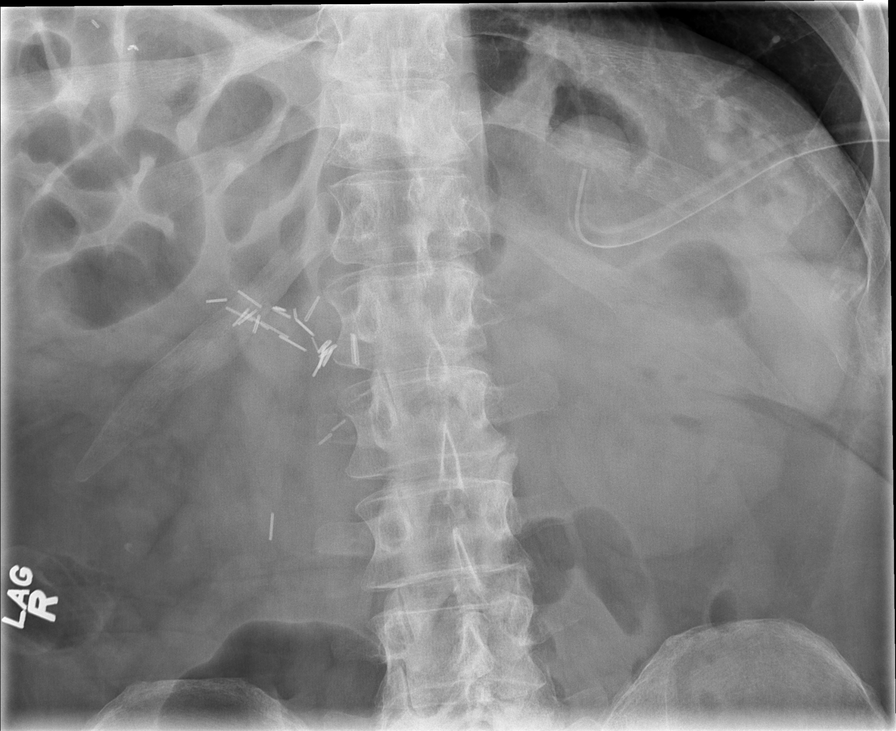

[t abdomen supine (3 of 3)]
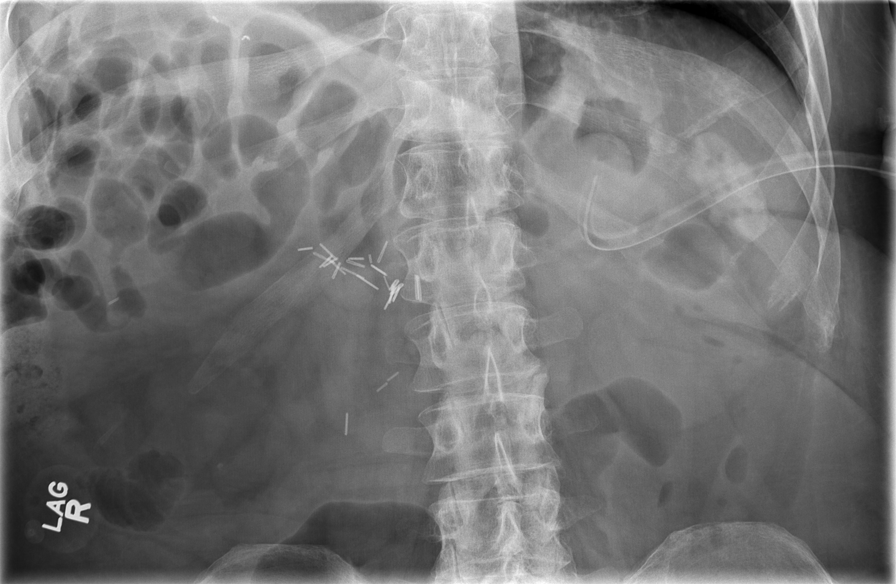

[t chest supine (1 of 2)]
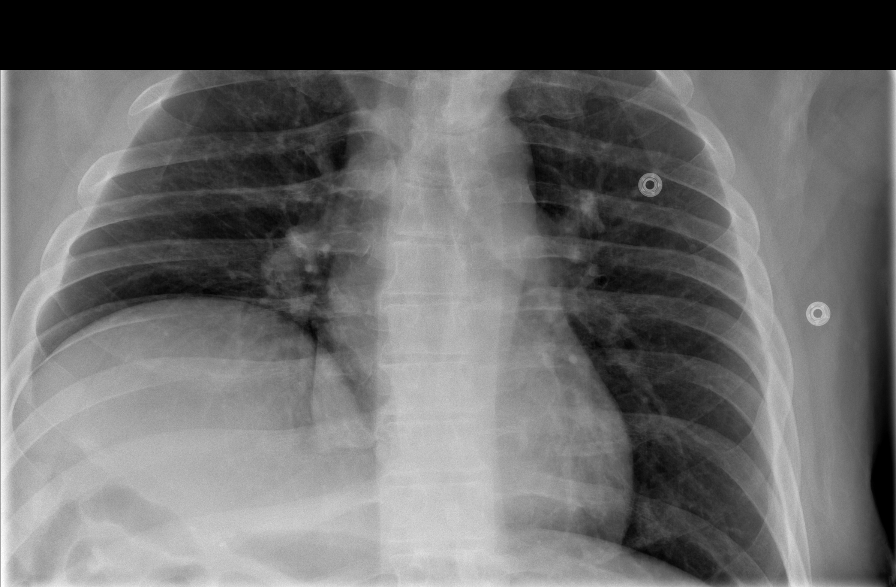

[t chest supine (2 of 2)]
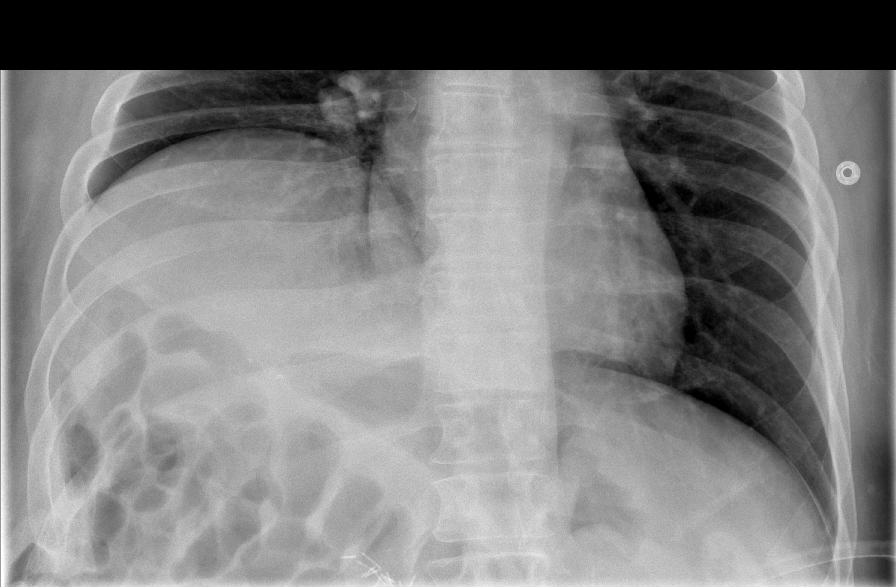

[7 of 7 positions shown; findings below may reference images not displayed]

FINDINGS: Single view of the chest demonstrates clear lungs and a
normal heart size.  Elevation right hemidiaphragm is again seen as
on the prior studies.

Two views of the abdomen show no free intraperitoneal air.  Feeding
tube is noted.  Bowel gas pattern is unremarkable.
IMPRESSION: No acute finding chest or abdomen.

## 2013-02-05 IMAGING — CT CT ABD-PELV W/ CM
1 of 4 series · 14 of 32 positions shown, 19 images · IV contrast (100 ML OMNI 300)
Comparison: CT abdomen and pelvis 12/26/2011.

CLINICAL DATA: Pain.  Nausea and vomiting.  Elevated white blood
cell count.

CT ABDOMEN AND PELVIS WITH CONTRAST
TECHNIQUE: Multidetector CT imaging of the abdomen and pelvis was
performed following the standard protocol during bolus
administration of intravenous contrast.
Contrast: 100mL OMNIPAQUE IOHEXOL 300 MG/ML  SOLN

[Series 2: abd/pel with · axial · 0.87mm/px · z∈[-105,+365]mm · 14 of 106 slices shown, 19 images]
[im 6/106  soft-tissue]
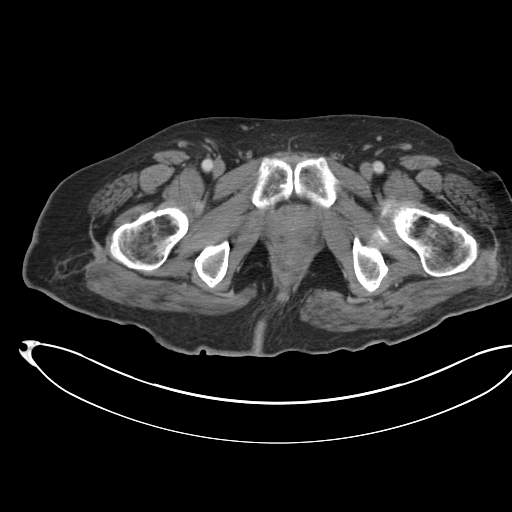
[im 6/106  bone]
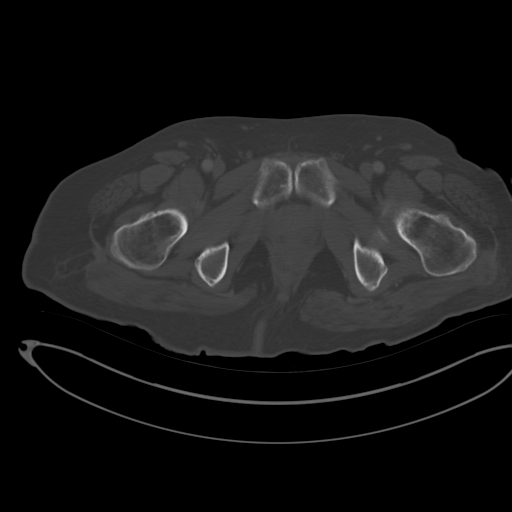
[im 12/106  soft-tissue]
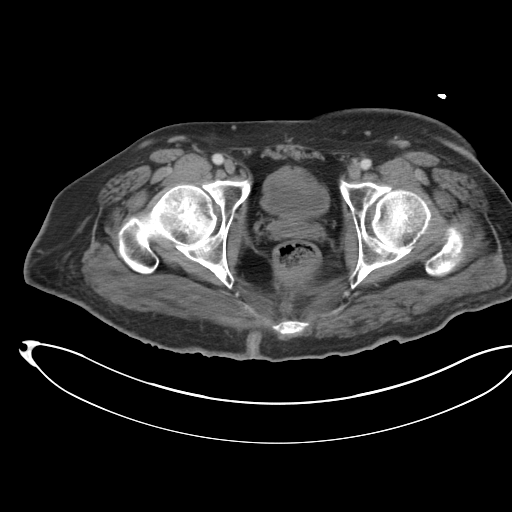
[im 24/106  soft-tissue]
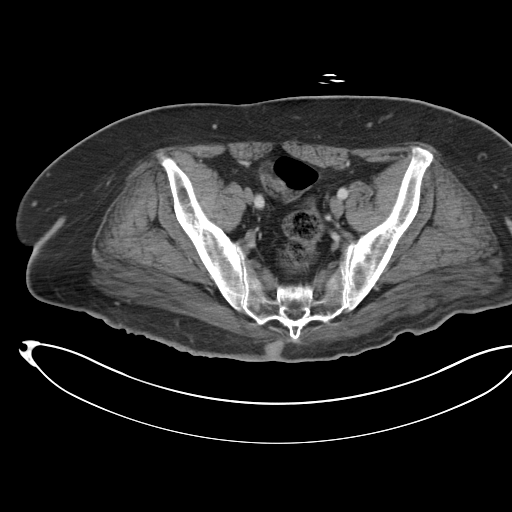
[im 30/106  soft-tissue]
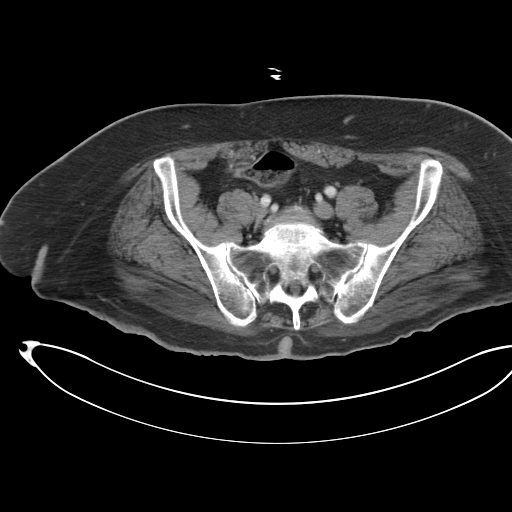
[im 36/106  soft-tissue]
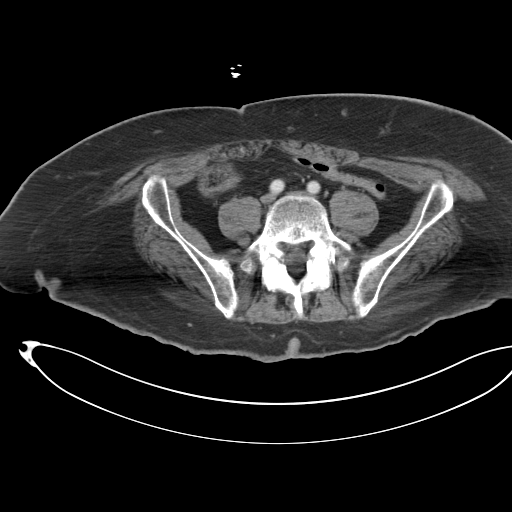
[im 47/106  soft-tissue]
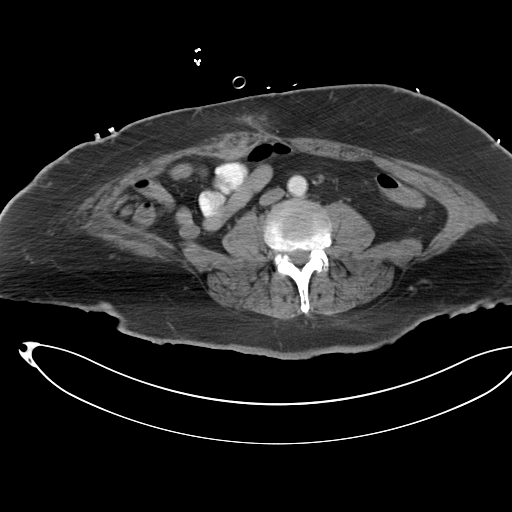
[im 53/106  soft-tissue]
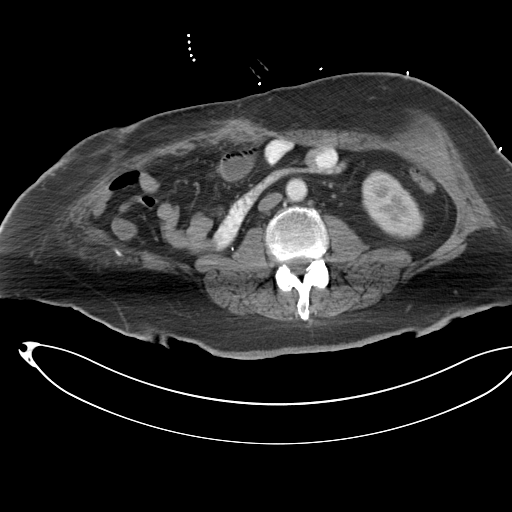
[im 59/106  soft-tissue]
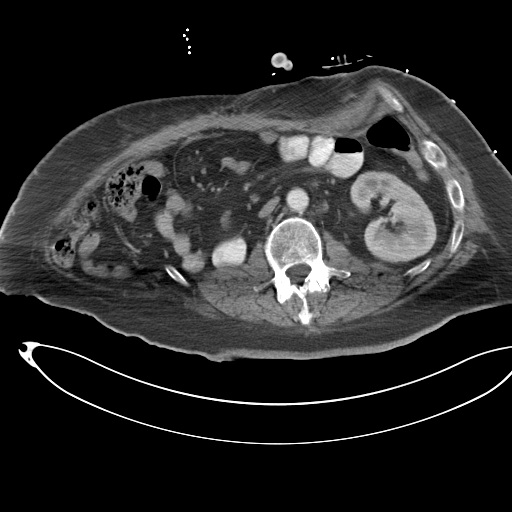
[im 71/106  soft-tissue]
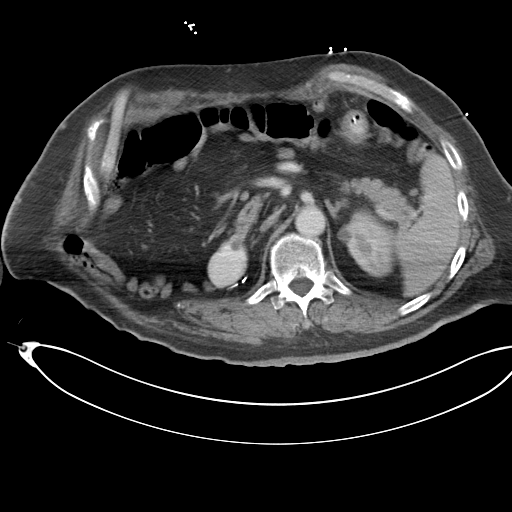
[im 71/106  bone]
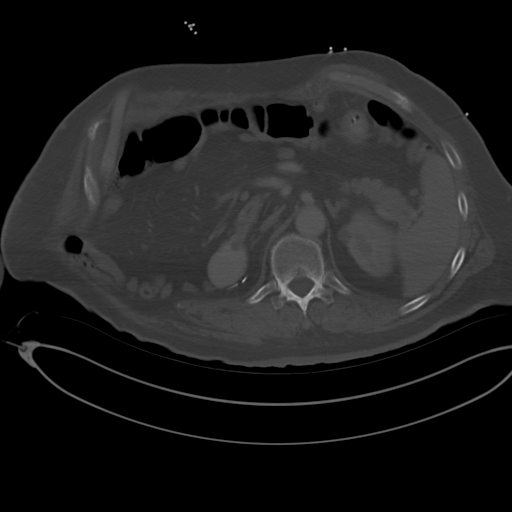
[im 76/106  soft-tissue]
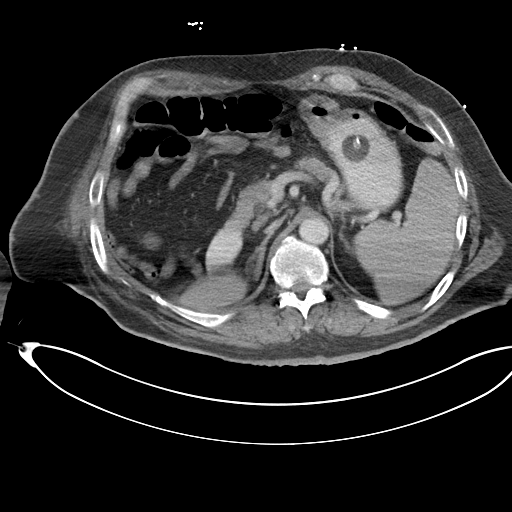
[im 82/106  soft-tissue]
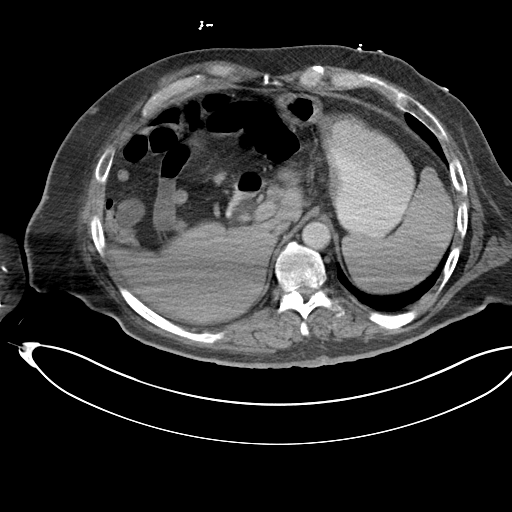
[im 82/106  lung]
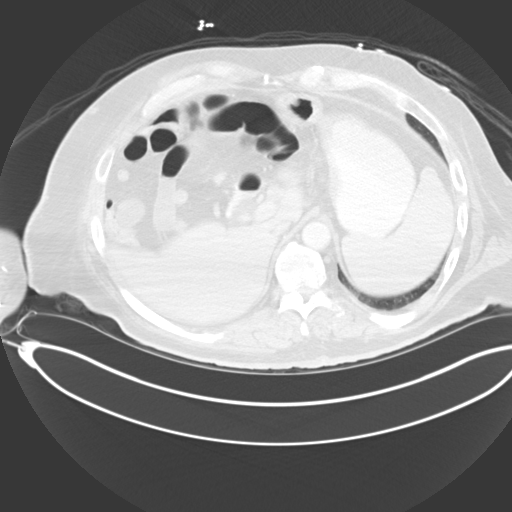
[im 88/106  lung]
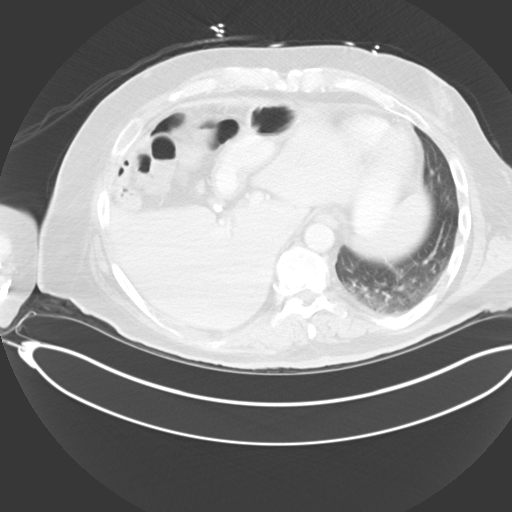
[im 94/106  soft-tissue]
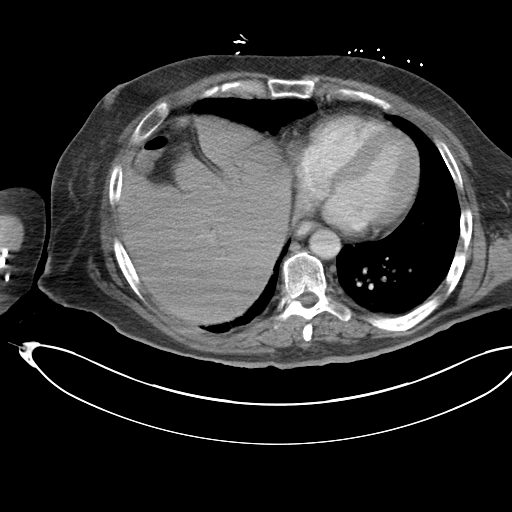
[im 94/106  lung]
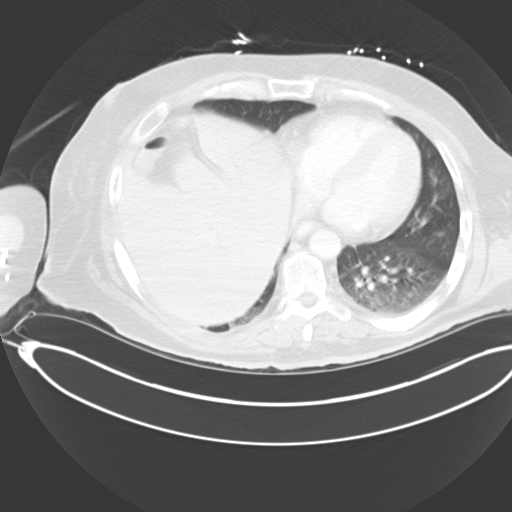
[im 100/106  soft-tissue]
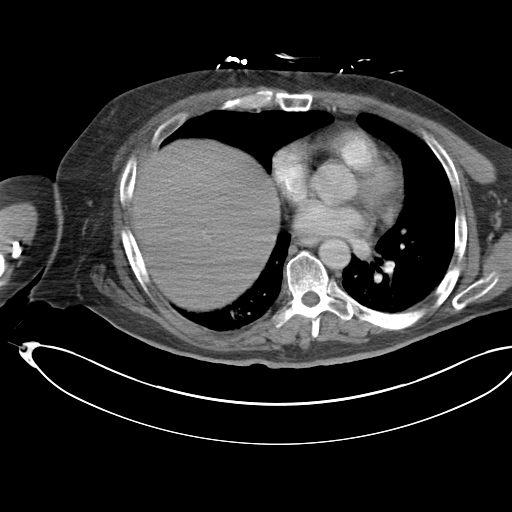
[im 100/106  lung]
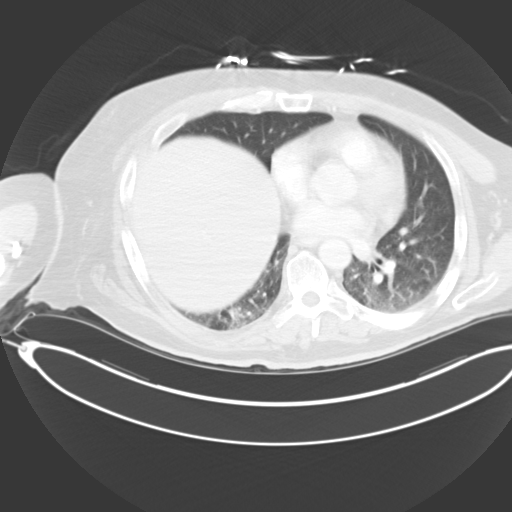

[14 of 32 positions shown; findings below may reference images not displayed]

FINDINGS: Bibasilar atelectasis is identified.  No pleural or
pericardial effusion is seen.

The patient is status post cholecystectomy.  Postoperative change
of right nephrectomy is seen as on the prior exam. There is
compensatory hypertrophy of the left kidney. Small left renal cyst
is noted.  A feeding tube is in place. Laxity of the right
abdominal wall is unchanged.  The spleen and pancreas are
unremarkable.  Mild biliary ductal prominence is unchanged.  No
lymphadenopathy or fluid is identified.  The stomach and small and
large bowel appear normal.  Bilateral L5 pars interarticularis
defects with associated 0.5 cm of anterolisthesis of L5 on S1 are
unchanged.  No worrisome bony finding is identified.
IMPRESSION: 1.  No acute finding.
2.  Status post cholecystectomy and right nephrectomy.
3.  Bilateral L5 pars interarticularis defects with associated
cm of anterolisthesis of L5 on S1, unchanged.

## 2013-02-05 IMAGING — CT CT HEAD W/O CM
1 series · 16 of 30 positions shown, 20 images · non-contrast
Comparison: 11/12/2011.

CLINICAL DATA: Headache.  History of brain injury.

CT HEAD WITHOUT CONTRAST
TECHNIQUE: Contiguous axial images were obtained from the base of
the skull through the vertex without contrast.

[Series 2: headseq 4.8 h45s · axial · 0.50mm/px · z∈[+794,+946]mm · 16 of 36 slices shown, 20 images]
[im 2/36  brain]
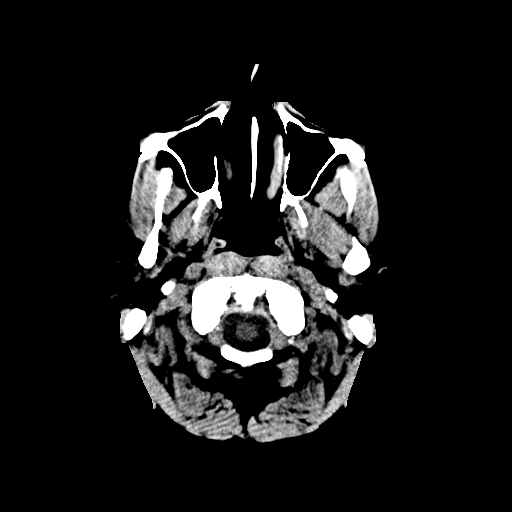
[im 2/36  bone]
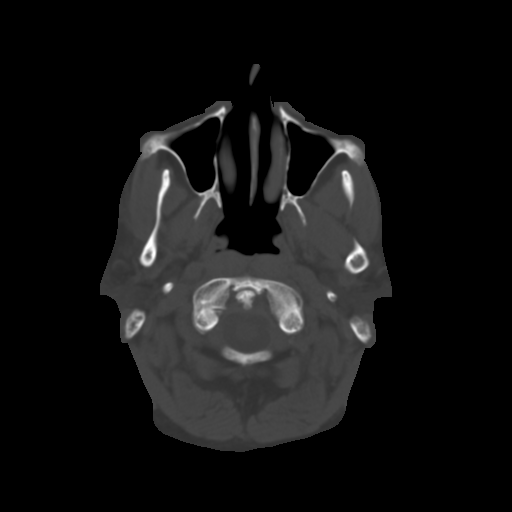
[im 4/36  brain]
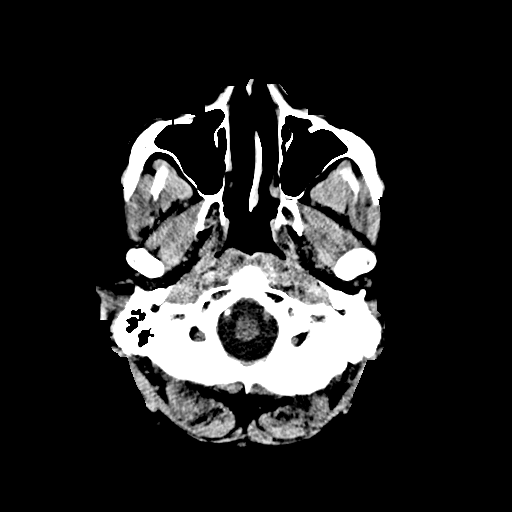
[im 7/36  brain]
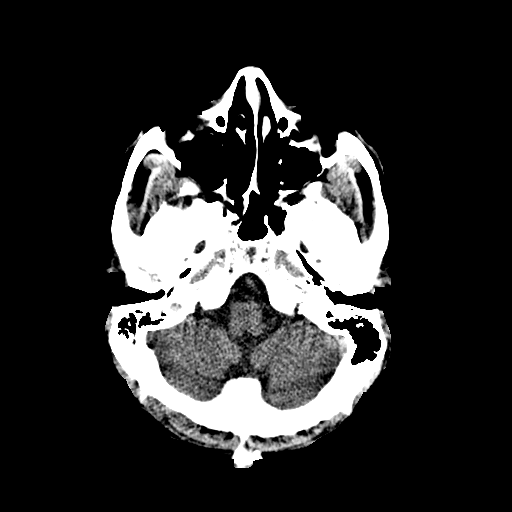
[im 9/36  brain]
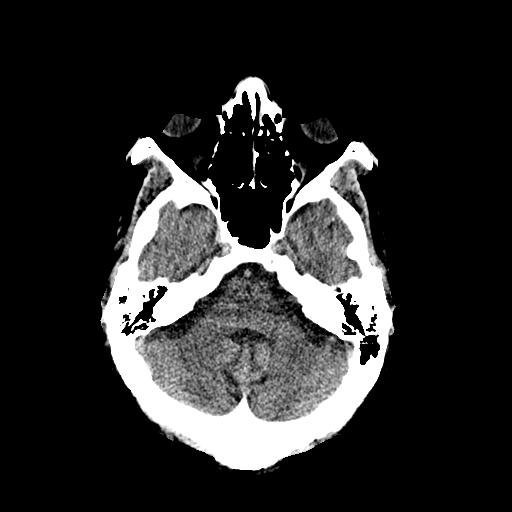
[im 10/36  brain]
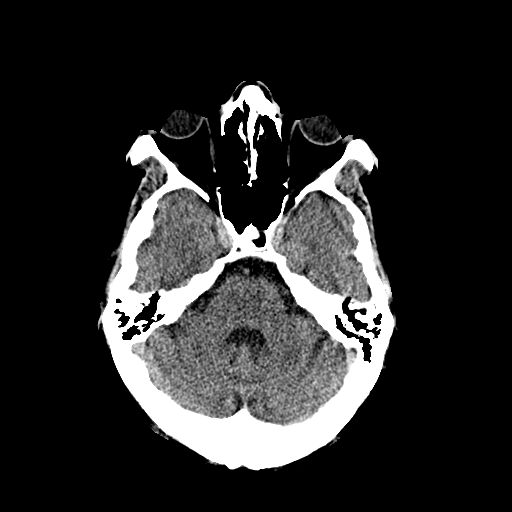
[im 10/36  bone]
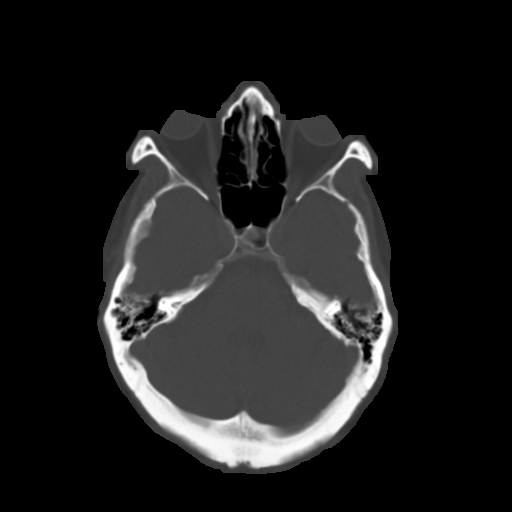
[im 13/36  brain]
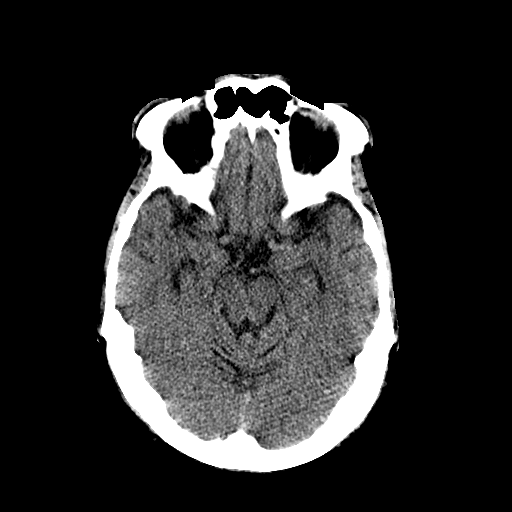
[im 15/36  brain]
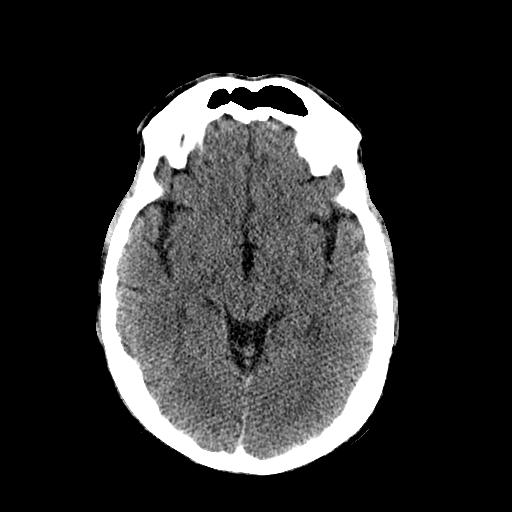
[im 17/36  brain]
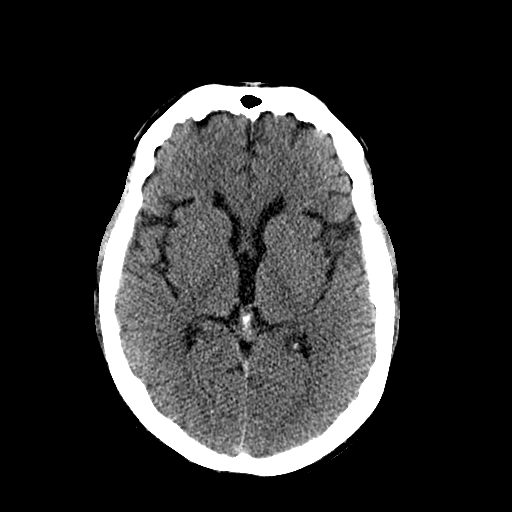
[im 19/36  brain]
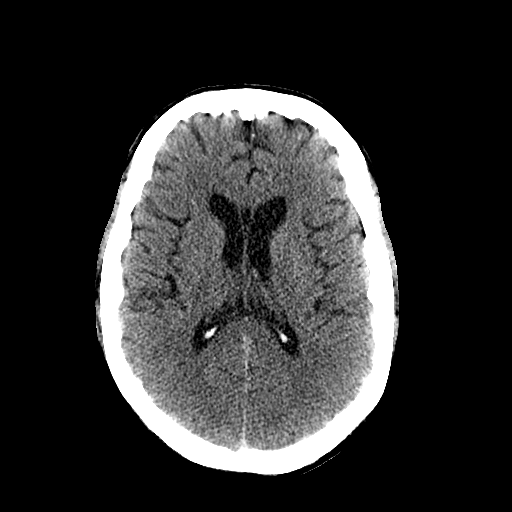
[im 19/36  bone]
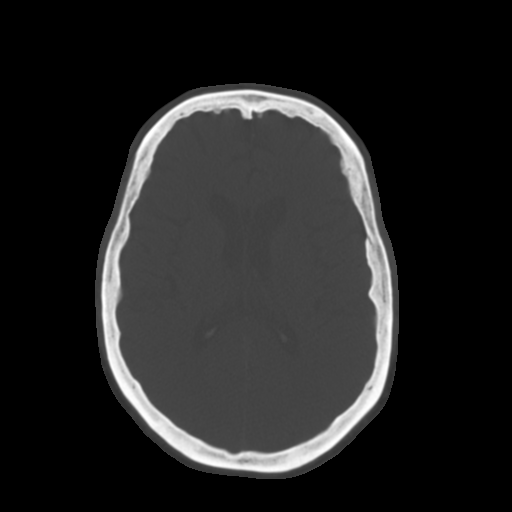
[im 21/36  brain]
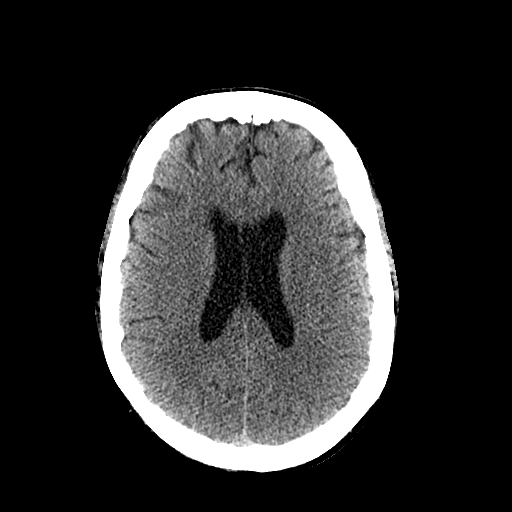
[im 23/36  brain]
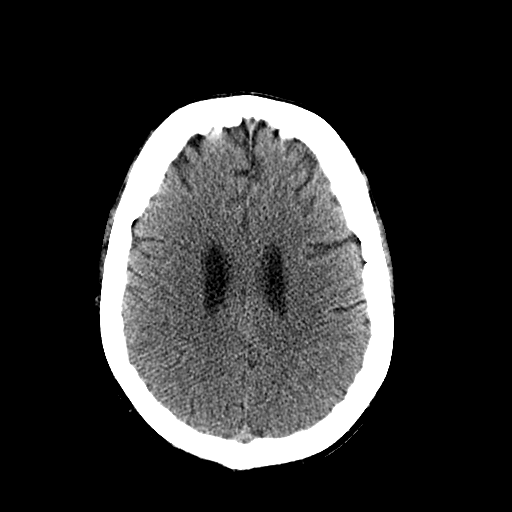
[im 26/36  brain]
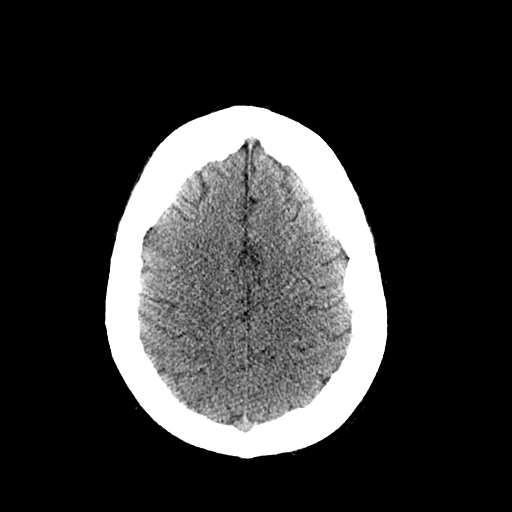
[im 27/36  brain]
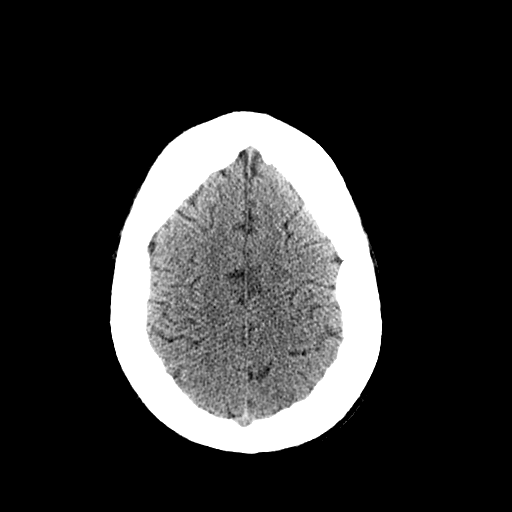
[im 27/36  bone]
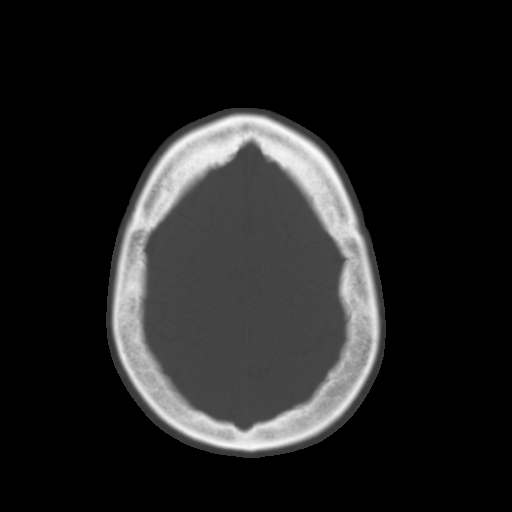
[im 29/36  brain]
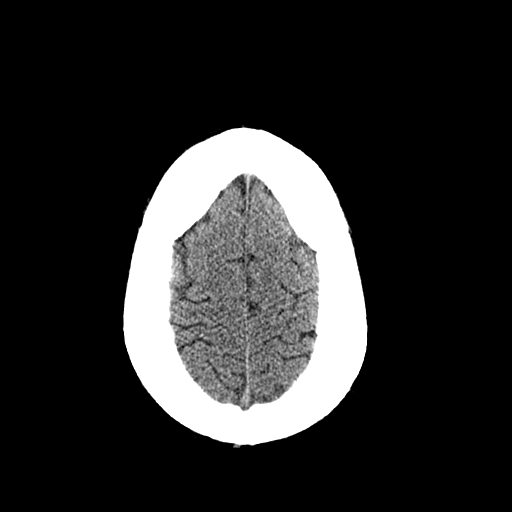
[im 32/36  brain]
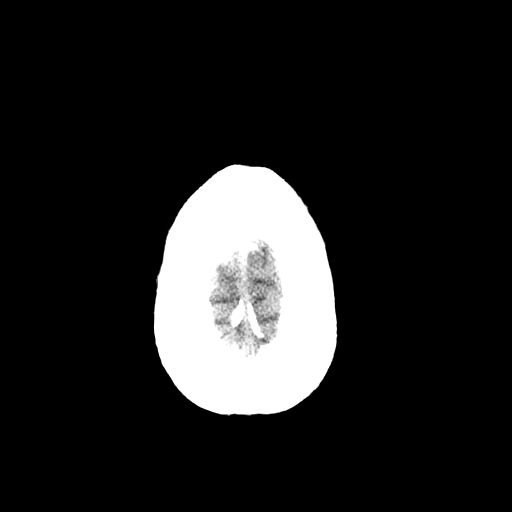
[im 34/36  brain]
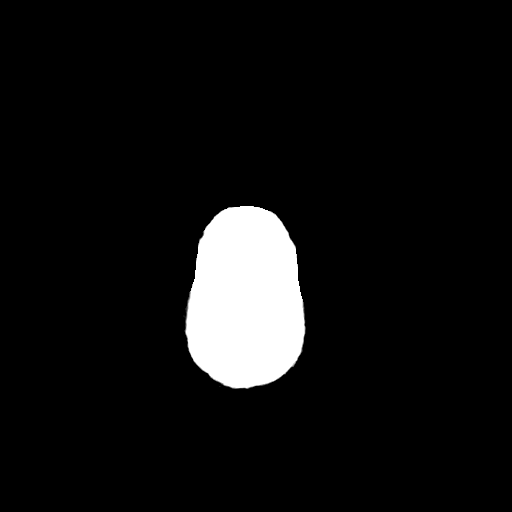

[16 of 30 positions shown; findings below may reference images not displayed]

FINDINGS: No mass lesion, mass effect, midline shift,
hydrocephalus, hemorrhage.  No territorial ischemia or acute
infarction.  Mastoid air cells clear.  Paranasal sinuses clear.
IMPRESSION: Negative CT head.  No interval change.

## 2013-04-17 ENCOUNTER — Non-Acute Institutional Stay (SKILLED_NURSING_FACILITY): Payer: Medicare Other | Admitting: Internal Medicine

## 2013-04-17 DIAGNOSIS — I255 Ischemic cardiomyopathy: Secondary | ICD-10-CM

## 2013-04-17 DIAGNOSIS — K5900 Constipation, unspecified: Secondary | ICD-10-CM

## 2013-04-17 DIAGNOSIS — E0590 Thyrotoxicosis, unspecified without thyrotoxic crisis or storm: Secondary | ICD-10-CM

## 2013-04-17 DIAGNOSIS — I2589 Other forms of chronic ischemic heart disease: Secondary | ICD-10-CM

## 2013-04-17 DIAGNOSIS — G931 Anoxic brain damage, not elsewhere classified: Secondary | ICD-10-CM

## 2013-04-27 NOTE — Progress Notes (Signed)
PROGRESS NOTE  DATE: 04-17-13  FACILITY: [facility]  LEVEL OF CARE: SNF  Routine Visit  CHIEF COMPLAINT:  Manage hyperthyroidism and constipation  HISTORY OF PRESENT ILLNESS:  REASSESSMENT OF ONGOING PROBLEM(S):  1. HYPERTHYROIDISM: Patient's hyperthyroidism remains stable.  Staff deny anxiety, irritability or palpitations. Currently off of Tapazole. Last TFTs are:  TSH 2.96 in 2/14, free T3-2.6 in 11/13.  Patient is a poor historian secondary to anoxic brain injury.  2. CONSTIPATION: The constipation remains stable. No complications from the medications presently being used. Staff deny ongoing constipation, abdominal pain, nausea or vomiting.  PAST MEDICAL HISTORY : Reviewed.  No changes.  CURRENT MEDICATIONS: Reviewed per [facility]  REVIEW OF SYSTEMS: Unobtainable secondary to anoxic brain injury  PHYSICAL EXAMINATION  VS:  T 98.7       P 69      RR 18      BP 109/65     POX %     WT (Lb) 228  GENERAL: no acute distress, normal body habitus NECK: supple, trachea midline, no neck masses, no thyroid tenderness, no thyromegaly RESPIRATORY: breathing is even & unlabored, BS CTAB CARDIAC: RRR, no murmur,no extra heart sounds, no edema GI: abdomen soft, normal BS, no masses, no tenderness, no hepatomegaly, no splenomegaly PSYCHIATRIC: the patient is alert & oriented to person, affect & behavior appropriate  LABS/RADIOLOGY: 2/14 Depakote 6 1/14 he will profile normal, platelets 121 otherwise CBC normal, BMP normal  ASSESSMENT/PLAN:  1. hyperthyroidism-stable of Tapazole. 2. constipation-well-controlled. 3. anoxic brain injury-continue supportive care. Depakote was increased. 4. anemia of chronic disease-continue iron. 5.  Cardiomyopathy-well compensated. 6. depression-continue current medication. 7. check Depakote level.  CPT CODE: 96045

## 2013-05-08 ENCOUNTER — Non-Acute Institutional Stay (SKILLED_NURSING_FACILITY): Payer: Medicare Other | Admitting: Internal Medicine

## 2013-05-08 DIAGNOSIS — E039 Hypothyroidism, unspecified: Secondary | ICD-10-CM

## 2013-05-08 DIAGNOSIS — D696 Thrombocytopenia, unspecified: Secondary | ICD-10-CM

## 2013-05-15 ENCOUNTER — Non-Acute Institutional Stay (SKILLED_NURSING_FACILITY): Payer: Medicare Other | Admitting: Internal Medicine

## 2013-05-15 DIAGNOSIS — D638 Anemia in other chronic diseases classified elsewhere: Secondary | ICD-10-CM

## 2013-05-15 DIAGNOSIS — E039 Hypothyroidism, unspecified: Secondary | ICD-10-CM

## 2013-05-15 DIAGNOSIS — R4182 Altered mental status, unspecified: Secondary | ICD-10-CM

## 2013-05-15 DIAGNOSIS — K5900 Constipation, unspecified: Secondary | ICD-10-CM

## 2013-05-16 DIAGNOSIS — D638 Anemia in other chronic diseases classified elsewhere: Secondary | ICD-10-CM | POA: Insufficient documentation

## 2013-05-16 DIAGNOSIS — E039 Hypothyroidism, unspecified: Secondary | ICD-10-CM | POA: Insufficient documentation

## 2013-05-16 DIAGNOSIS — K5900 Constipation, unspecified: Secondary | ICD-10-CM | POA: Insufficient documentation

## 2013-05-16 NOTE — Progress Notes (Signed)
PROGRESS NOTE  DATE: 05-15-13  FACILITY: [facility]  LEVEL OF CARE: SNF  Routine Visit  CHIEF COMPLAINT:  Manage altered mental status, hypothyroidism and constipation  HISTORY OF PRESENT ILLNESS:  REASSESSMENT OF ONGOING PROBLEM(S):  1. HYPOTHYROIDISM: The hypothyroidism remains stable. No complications noted from the medications presently being used.  The staff denyconstipation.  Last TSH 9.27 in 5/14.  Patient is a poor historian secondary to anoxic brain injury.  2. CONSTIPATION: The constipation remains stable. No complications from the medications presently being used. Staff deny ongoing constipation, abdominal pain, nausea or vomiting.  3. ALTERED MENTAL STATUS: New problem. Staff and family reports that patient is very lethargic and sleeping most of the time. The precipitating or alleviating factors cannot be identified. Symptoms are constant, no temporal relationship. There are no other associated signs and symptoms. Symptoms have been present for few days.  PAST MEDICAL HISTORY : Reviewed.  No changes.  CURRENT MEDICATIONS: Reviewed per [facility]  REVIEW OF SYSTEMS: Unobtainable secondary to anoxic brain injury  PHYSICAL EXAMINATION  VS:  T 98.7       P78      RR 20      BP 130/76     POX %     WT (Lb) 228  GENERAL: no acute distress, normal body habitus EYES: Unable to assess NECK: supple, trachea midline, no neck masses, no thyroid tenderness, no thyromegaly LYMPHATICS: No cervical lymphadenopathy, no supraclavicular lymphadenopathy RESPIRATORY: breathing is even & unlabored, BS CTAB CARDIAC: RRR, no murmur,no extra heart sounds, no edema GI: abdomen soft, normal BS, no masses, no tenderness, no hepatomegaly, no splenomegaly PSYCHIATRIC: the patient is lethargic, decreased affect & mood  LABS/RADIOLOGY: 5/14 fasting lipid panel normal, including A1c 4.9, CMP normal, platelets 139 otherwise CBC normal 2/14 Depakote 6 1/14 he will profile normal, platelets 121  otherwise CBC normal, BMP normal  ASSESSMENT/PLAN:  1. hypothyroidism-new problem. Levothyroxine was started. Recheck TSH in 6 weeks is pending. 2. constipation-well-controlled. 3. anoxic brain injury-continue supportive care. 4. anemia of chronic disease-continue iron. 5.  Cardiomyopathy-well compensated. 6. depression-continue current medication. 7. altered mental status-new problem. Check UA culture and sensitivities.  CPT CODE: 78295

## 2013-05-26 NOTE — Progress Notes (Signed)
Patient ID: [name], male   DOB: [birth year], 47 y.o.   MRN: [id number]        PROGRESS NOTE  DATE: 05/08/2013  FACILITY:  [facility]  LEVEL OF CARE: SNF (31)  Acute Visit  CHIEF COMPLAINT:  Manage hypothyroidism and thrombocytopenia.    HISTORY OF PRESENT ILLNESS: I was requested by the staff to assess the patient regarding above problem(s):  HYPOTHYROIDISM:  New problem.   Patient is not  currently on any thyroid medications.  He has a history of hyperthyroidism.  The staff denies fatigue or constipation.    Last TSH:  On 05/05/2013, TSH 9.27.  THROMBOCYTOPENIA:  On 05/05/2013, platelet count was 139.  In 12/2012, platelet count was 121.  Staff denies easy bruising.  Patient is a poor historian due to anoxic brain injury.    PAST MEDICAL HISTORY : Reviewed.  No changes.  CURRENT MEDICATIONS: Reviewed per [facility]  REVIEW OF SYSTEMS:  Unobtainable due to anoxic brain injury.    PHYSICAL EXAMINATION  GENERAL: no acute distress, morbidly obese body habitus NECK: supple, trachea midline, no neck masses, no thyroid tenderness, no thyromegaly RESPIRATORY: breathing is even & unlabored, BS CTAB CARDIAC: RRR, no murmur,no extra heart sounds, no edema GI: abdomen soft, normal BS, no masses, no tenderness, no hepatomegaly, no splenomegaly, patient has a PEG  PSYCHIATRIC: the patient is alert & oriented to person, affect & behavior appropriate  ASSESSMENT/PLAN:  Hypothyroidism.  New problem.  Start levothyroxine 25 mcg q.d.  Check TSH in six weeks.   Thrombocytopenia.  Platelet count improved.  Patient is asymptomatic.  We will monitor.    CPT CODE: 40981

## 2013-06-05 ENCOUNTER — Non-Acute Institutional Stay (SKILLED_NURSING_FACILITY): Payer: Medicare Other | Admitting: Internal Medicine

## 2013-06-05 DIAGNOSIS — G931 Anoxic brain damage, not elsewhere classified: Secondary | ICD-10-CM

## 2013-06-05 DIAGNOSIS — K5900 Constipation, unspecified: Secondary | ICD-10-CM

## 2013-06-05 DIAGNOSIS — E039 Hypothyroidism, unspecified: Secondary | ICD-10-CM

## 2013-06-05 DIAGNOSIS — D638 Anemia in other chronic diseases classified elsewhere: Secondary | ICD-10-CM

## 2013-06-06 NOTE — Progress Notes (Signed)
PROGRESS NOTE  DATE: 06-05-13  FACILITY: [facility]  LEVEL OF CARE: SNF  Routine Visit  CHIEF COMPLAINT:  Manage hypothyroidism and constipation  HISTORY OF PRESENT ILLNESS:  REASSESSMENT OF ONGOING PROBLEM(S):  1. HYPOTHYROIDISM: The hypothyroidism remains stable. No complications noted from the medications presently being used.  The staff denyconstipation.  Last TSH 9.27 in 5/14.  Patient is a poor historian secondary to anoxic brain injury.  2. CONSTIPATION: The constipation remains stable. No complications from the medications presently being used. Staff deny ongoing constipation, abdominal pain, nausea or vomiting.  PAST MEDICAL HISTORY : Reviewed.  No changes.  CURRENT MEDICATIONS: Reviewed per [facility]  REVIEW OF SYSTEMS: Unobtainable secondary to anoxic brain injury  PHYSICAL EXAMINATION  VS:  T 97.4       P64     RR 20      BP 100/70     POX %     WT (Lb) 228  GENERAL: no acute distress, normal body habitus EYES: Unable to assess NECK: supple, trachea midline, no neck masses, no thyroid tenderness, no thyromegaly RESPIRATORY: breathing is even & unlabored, BS CTAB CARDIAC: RRR, no murmur,no extra heart sounds, no edema GI: abdomen soft, normal BS, no masses, no tenderness, no hepatomegaly, no splenomegaly PSYCHIATRIC: the patient is lethargic, decreased affect & mood  LABS/RADIOLOGY: 5/14 fasting lipid panel normal, including A1c 4.9, CMP normal, platelets 139 otherwise CBC normal 2/14 Depakote 6 1/14 he will profile normal, platelets 121 otherwise CBC normal, BMP normal  ASSESSMENT/PLAN:  1. hypothyroidism-new problem. Levothyroxine was started. Recheck TSH in 6 weeks is pending. 2. constipation-well-controlled. 3. anoxic brain injury-continue supportive care. 4. anemia of chronic disease-continue iron. 5.  Cardiomyopathy-well compensated. 6. depression-continue current medication.  CPT CODE: 65784

## 2013-06-21 ENCOUNTER — Non-Acute Institutional Stay (SKILLED_NURSING_FACILITY): Payer: Medicare Other | Admitting: Internal Medicine

## 2013-06-21 DIAGNOSIS — E039 Hypothyroidism, unspecified: Secondary | ICD-10-CM

## 2013-07-03 ENCOUNTER — Non-Acute Institutional Stay (SKILLED_NURSING_FACILITY): Payer: Medicare Other | Admitting: Internal Medicine

## 2013-07-03 DIAGNOSIS — K5900 Constipation, unspecified: Secondary | ICD-10-CM

## 2013-07-03 DIAGNOSIS — K5909 Other constipation: Secondary | ICD-10-CM

## 2013-07-03 DIAGNOSIS — D638 Anemia in other chronic diseases classified elsewhere: Secondary | ICD-10-CM

## 2013-07-03 DIAGNOSIS — E0590 Thyrotoxicosis, unspecified without thyrotoxic crisis or storm: Secondary | ICD-10-CM

## 2013-07-03 DIAGNOSIS — G931 Anoxic brain damage, not elsewhere classified: Secondary | ICD-10-CM

## 2013-07-03 NOTE — Progress Notes (Signed)
PROGRESS NOTE  DATE: 07-03-13  FACILITY: [facility]  LEVEL OF CARE: SNF  Routine Visit  CHIEF COMPLAINT:  Manage hypothyroidism and constipation  HISTORY OF PRESENT ILLNESS:  REASSESSMENT OF ONGOING PROBLEM(S):  1. HYPOTHYROIDISM: The hypothyroidism remains stable. No complications noted from the medications presently being used.  The staff denyconstipation.  Last TSH 9.27 in 5/14.  Patient is a poor historian secondary to anoxic brain injury.  2. CONSTIPATION: The constipation remains stable. No complications from the medications presently being used. Staff deny ongoing constipation, abdominal pain, nausea or vomiting.  PAST MEDICAL HISTORY : Reviewed.  No changes.  CURRENT MEDICATIONS: Reviewed per [facility]  REVIEW OF SYSTEMS: Unobtainable secondary to anoxic brain injury  PHYSICAL EXAMINATION  VS:  T 97.2       P75     RR 20      BP 140/88    POX %     WT (Lb) 223  GENERAL: no acute distress, normal body habitus EYES: Unable to assess NECK: supple, trachea midline, no neck masses, no thyroid tenderness, no thyromegaly RESPIRATORY: breathing is even & unlabored, BS CTAB CARDIAC: RRR, no murmur,no extra heart sounds, no edema GI: abdomen soft, normal BS, no masses, no tenderness, no hepatomegaly, no splenomegaly PSYCHIATRIC: the patient is lethargic, decreased affect & mood  LABS/RADIOLOGY: 5/14 fasting lipid panel normal, including A1c 4.9, CMP normal, platelets 139 otherwise CBC normal 2/14 Depakote 6 1/14 he will profile normal, platelets 121 otherwise CBC normal, BMP normal  ASSESSMENT/PLAN:  1. hypothyroidism-new problem. Levothyroxine was started. Recheck TSH in 6 weeks is pending. 2. constipation-well-controlled. 3. anoxic brain injury-continue supportive care. 4. anemia of chronic disease-continue iron. 5.  Cardiomyopathy-well compensated.  CPT CODE: 40981

## 2013-07-17 ENCOUNTER — Non-Acute Institutional Stay (SKILLED_NURSING_FACILITY): Payer: Medicare Other | Admitting: Adult Health

## 2013-07-17 DIAGNOSIS — D638 Anemia in other chronic diseases classified elsewhere: Secondary | ICD-10-CM

## 2013-07-17 DIAGNOSIS — W19XXXA Unspecified fall, initial encounter: Secondary | ICD-10-CM

## 2013-07-17 DIAGNOSIS — G931 Anoxic brain damage, not elsewhere classified: Secondary | ICD-10-CM

## 2013-07-17 DIAGNOSIS — W19XXXD Unspecified fall, subsequent encounter: Secondary | ICD-10-CM

## 2013-07-24 NOTE — Progress Notes (Signed)
Patient ID: [name], male   DOB: 27-Nov-1966, 47 y.o.   MRN: [id number]        PROGRESS NOTE  DATE: 06/21/2013  FACILITY:  [facility]  LEVEL OF CARE: SNF (31)  Acute Visit  CHIEF COMPLAINT:  Manage hypothyroidism.    HISTORY OF PRESENT ILLNESS: I was requested by the staff to assess the patient regarding above problem(s):  HYPOTHYROIDISM: The hypothyroidism remains stable. No complications noted from the medications presently being used.  The staff denies fatigue or constipation.  Last TSH:  On 06/19/2013:  TSH 5.04.  In May 2014:  TSH 9.27.  Patient is a poor historian due to anoxic brain injury.    PAST MEDICAL HISTORY : Reviewed.  No changes.  CURRENT MEDICATIONS: Reviewed per [facility]  PHYSICAL EXAMINATION  GENERAL: no acute distress, moderately obese body habitus NECK: supple, trachea midline, no neck masses, no thyroid tenderness, no thyromegaly CARDIAC: RRR, no murmur,no extra heart sounds, no edema  ASSESSMENT/PLAN:  Hypothyroidism 244.9.  TSH normalizing.  Continue current Synthroid dose.    CPT CODE: 40981

## 2013-07-31 ENCOUNTER — Non-Acute Institutional Stay (SKILLED_NURSING_FACILITY): Payer: Medicare Other | Admitting: Internal Medicine

## 2013-07-31 ENCOUNTER — Ambulatory Visit (HOSPITAL_COMMUNITY)
Admission: RE | Admit: 2013-07-31 | Discharge: 2013-07-31 | Disposition: A | Discharge status: Home / Self Care | Payer: Medicare Other | Source: Ambulatory Visit | Attending: Internal Medicine | Admitting: Internal Medicine

## 2013-07-31 ENCOUNTER — Other Ambulatory Visit (HOSPITAL_BASED_OUTPATIENT_CLINIC_OR_DEPARTMENT_OTHER): Payer: Self-pay | Admitting: Internal Medicine

## 2013-07-31 DIAGNOSIS — Y929 Unspecified place or not applicable: Secondary | ICD-10-CM | POA: Insufficient documentation

## 2013-07-31 DIAGNOSIS — K5900 Constipation, unspecified: Secondary | ICD-10-CM

## 2013-07-31 DIAGNOSIS — IMO0002 Reserved for concepts with insufficient information to code with codable children: Secondary | ICD-10-CM

## 2013-07-31 DIAGNOSIS — G931 Anoxic brain damage, not elsewhere classified: Secondary | ICD-10-CM

## 2013-07-31 DIAGNOSIS — E039 Hypothyroidism, unspecified: Secondary | ICD-10-CM

## 2013-07-31 DIAGNOSIS — Y832 Surgical operation with anastomosis, bypass or graft as the cause of abnormal reaction of the patient, or of later complication, without mention of misadventure at the time of the procedure: Secondary | ICD-10-CM | POA: Insufficient documentation

## 2013-07-31 DIAGNOSIS — Z431 Encounter for attention to gastrostomy: Secondary | ICD-10-CM | POA: Insufficient documentation

## 2013-07-31 DIAGNOSIS — K9423 Gastrostomy malfunction: Secondary | ICD-10-CM | POA: Insufficient documentation

## 2013-07-31 DIAGNOSIS — D638 Anemia in other chronic diseases classified elsewhere: Secondary | ICD-10-CM

## 2013-07-31 MED ORDER — IOHEXOL 300 MG/ML  SOLN
50.0000 mL | Freq: Once | INTRAMUSCULAR | Status: AC | PRN
  Administered 2013-07-31: 10 mL

## 2013-07-31 NOTE — Procedures (Signed)
Procedure:  Gastrostomy replacement under fluoro New 22 Fr balloon retention gastrostomy tube placed via tract.  Tip in body of stomach:  Confirmed by fluoro after contrast injection.  OK to use tube.

## 2013-08-04 NOTE — Progress Notes (Signed)
PROGRESS NOTE  DATE: 07-31-13  FACILITY: [facility]  LEVEL OF CARE: SNF  Routine Visit  CHIEF COMPLAINT:  Manage hypothyroidism and constipation  HISTORY OF PRESENT ILLNESS:  REASSESSMENT OF ONGOING PROBLEM(S):  HYPOTHYROIDISM: The hypothyroidism remains stable. No complications noted from the medications presently being used.  The staff denyconstipation.  Last TSH 9.27 in 5/14, 6/14 TSH 5.04. Patient is a poor historian secondary to anoxic brain injury.  CONSTIPATION: The constipation remains stable. No complications from the medications presently being used. Staff deny ongoing constipation, abdominal pain, nausea or vomiting.  PAST MEDICAL HISTORY : Reviewed.  No changes.  CURRENT MEDICATIONS: Reviewed per [facility]  REVIEW OF SYSTEMS: Unobtainable secondary to anoxic brain injury  PHYSICAL EXAMINATION  VS:  T 97.1       P74     RR 18     BP 120/79   POX %     WT (Lb) 224  GENERAL: no acute distress, normal body habitus NECK: supple, trachea midline, no neck masses, no thyroid tenderness, no thyromegaly RESPIRATORY: breathing is even & unlabored, BS CTAB CARDIAC: RRR, no murmur,no extra heart sounds, no edema GI: abdomen soft, normal BS, no masses, no tenderness, no hepatomegaly, no splenomegaly PSYCHIATRIC: the patient is lethargic, decreased affect & mood  LABS/RADIOLOGY: 5/14 fasting lipid panel normal, including A1c 4.9, CMP normal, platelets 139 otherwise CBC normal 2/14 Depakote 6 1/14 he will profile normal, platelets 121 otherwise CBC normal, BMP normal  ASSESSMENT/PLAN:  hypothyroidism-stable. constipation-well-controlled. anoxic brain injury-continue supportive care.  Depakote was increased. anemia of chronic disease-continue iron. Cardiomyopathy-well compensated. Check depakote level.  CPT CODE: 72536

## 2013-08-31 ENCOUNTER — Emergency Department (HOSPITAL_COMMUNITY): Payer: Medicare Other

## 2013-08-31 ENCOUNTER — Emergency Department (HOSPITAL_COMMUNITY)
Admission: EM | Admit: 2013-08-31 | Discharge: 2013-09-01 | Disposition: A | Discharge status: Home / Self Care | Payer: Medicare Other | Attending: Emergency Medicine | Admitting: Emergency Medicine

## 2013-08-31 ENCOUNTER — Encounter (HOSPITAL_COMMUNITY): Payer: Self-pay | Admitting: Emergency Medicine

## 2013-08-31 DIAGNOSIS — Z87440 Personal history of urinary (tract) infections: Secondary | ICD-10-CM | POA: Insufficient documentation

## 2013-08-31 DIAGNOSIS — Z862 Personal history of diseases of the blood and blood-forming organs and certain disorders involving the immune mechanism: Secondary | ICD-10-CM | POA: Insufficient documentation

## 2013-08-31 DIAGNOSIS — I4891 Unspecified atrial fibrillation: Secondary | ICD-10-CM | POA: Insufficient documentation

## 2013-08-31 DIAGNOSIS — I252 Old myocardial infarction: Secondary | ICD-10-CM | POA: Insufficient documentation

## 2013-08-31 DIAGNOSIS — G40909 Epilepsy, unspecified, not intractable, without status epilepticus: Secondary | ICD-10-CM | POA: Insufficient documentation

## 2013-08-31 DIAGNOSIS — Z8701 Personal history of pneumonia (recurrent): Secondary | ICD-10-CM | POA: Insufficient documentation

## 2013-08-31 DIAGNOSIS — Z91040 Latex allergy status: Secondary | ICD-10-CM | POA: Insufficient documentation

## 2013-08-31 DIAGNOSIS — Z880 Allergy status to penicillin: Secondary | ICD-10-CM | POA: Insufficient documentation

## 2013-08-31 DIAGNOSIS — Z8639 Personal history of other endocrine, nutritional and metabolic disease: Secondary | ICD-10-CM | POA: Insufficient documentation

## 2013-08-31 DIAGNOSIS — Z79899 Other long term (current) drug therapy: Secondary | ICD-10-CM | POA: Insufficient documentation

## 2013-08-31 DIAGNOSIS — Z8669 Personal history of other diseases of the nervous system and sense organs: Secondary | ICD-10-CM | POA: Insufficient documentation

## 2013-08-31 DIAGNOSIS — Z905 Acquired absence of kidney: Secondary | ICD-10-CM | POA: Insufficient documentation

## 2013-08-31 DIAGNOSIS — F411 Generalized anxiety disorder: Secondary | ICD-10-CM | POA: Insufficient documentation

## 2013-08-31 DIAGNOSIS — K219 Gastro-esophageal reflux disease without esophagitis: Secondary | ICD-10-CM | POA: Insufficient documentation

## 2013-08-31 DIAGNOSIS — G9340 Encephalopathy, unspecified: Secondary | ICD-10-CM | POA: Insufficient documentation

## 2013-08-31 DIAGNOSIS — E039 Hypothyroidism, unspecified: Secondary | ICD-10-CM | POA: Insufficient documentation

## 2013-08-31 DIAGNOSIS — G9349 Other encephalopathy: Secondary | ICD-10-CM

## 2013-08-31 NOTE — ED Provider Notes (Signed)
CSN: [id number]     Arrival date & time 08/31/13  2144 History   First MD Initiated Contact with Patient 08/31/13 2301     Chief Complaint  Patient presents with  . Altered Mental Status    HPI   Patient was transferred because of the changes mental status. Per the report received from nursing which was taken from EMS he patient is normally somewhat verbal and is not been verbal today. There has not been  decreases in his level of consciousness.  Chart shows a history of anoxic brain injury feeding tube. Urinary Tract infections Addison's disease. No reported fever shortness of breath vomiting diarrhea or other changes per the information transferred with him Past Medical History  Diagnosis Date  . Addison disease   . Anoxic brain injury   . A-fib   . Seizure disorder   . Dysphagia   . History of recurrent UTIs   . Hyperlipemia   . Aspiration pneumonia   . Mental disorder   . Anxiety   . Reflux   . Dysphagia   . Glucocorticoid deficiency   . Atrial fibrillation   . Glaucoma   . Hypothyroidism   . Encephalopathy   . Quadriplegia   . Addison disease   . Myocardial infarction    Past Surgical History  Procedure Laterality Date  . Nephrectomy  unknown  . Peg tube placement     History reviewed. No pertinent family history. History  Substance Use Topics  . Smoking status: Never Smoker   . Smokeless tobacco: Never Used  . Alcohol Use: No    Review of Systems  Unable to perform ROS: Mental status change    Allergies  Ativan; Latex; Morphine and related; Penicillins; Pyridium; and Soy allergy  Home Medications   Current Outpatient Rx  Name  Route  Sig  Dispense  Refill  . ARIPiprazole (ABILIFY) 5 MG tablet   Oral   Take 5 mg by mouth daily.         . diphenhydrAMINE (BENADRYL) 25 mg capsule   PEG Tube   25 mg by PEG Tube route every 6 (six) hours as needed. For allergic reaction         . divalproex (DEPAKOTE SPRINKLE) 125 MG capsule   Oral   Take 375 mg  by mouth 2 (two) times daily.          . feeding supplement (PRO-STAT SUGAR FREE 64) LIQD   PEG Tube   30 mLs by PEG Tube route 2 (two) times daily.          . ferrous sulfate 220 (44 FE) MG/5ML solution   PEG Tube   330 mg by PEG Tube route daily.          . fish oil-omega-3 fatty acids 1000 MG capsule   PEG Tube   1 g by PEG Tube route 2 (two) times daily. Via peg tube         . ipratropium-albuterol (DUONEB) 0.5-2.5 (3) MG/3ML SOLN   Nebulization   Take 3 mLs by nebulization every 6 (six) hours as needed. For shortness of breath         . levothyroxine (SYNTHROID, LEVOTHROID) 25 MCG tablet   Oral   Take 25 mcg by mouth daily before breakfast.         . methylcellulose (ARTIFICIAL TEARS) 1 % ophthalmic solution   Both Eyes   Place 1 drop into both eyes 4 (four) times daily.           Marland Kitchen  metoprolol tartrate (LOPRESSOR) 25 mg/10 mL SUSP   PEG Tube   12.5 mg by PEG Tube route 2 (two) times daily.          . multivitamin/minerals (GOLDEN AGE) LIQD   PEG Tube   5 mLs by PEG Tube route daily.         . pantoprazole sodium (PROTONIX) 40 mg/20 mL PACK   PEG Tube   40 mg by PEG Tube route daily at 12 noon.          . polyethylene glycol (MIRALAX / GLYCOLAX) packet   PEG Tube   17 g by PEG Tube route 2 (two) times daily. Mix 17gm 4-8 ounces of liquid         . traZODone (DESYREL) 50 MG tablet   Oral   Take 50 mg by mouth 3 (three) times daily as needed (ANXIETY).          Marland Kitchen venlafaxine (EFFEXOR) 37.5 MG tablet   PEG Tube   37.5 mg by PEG Tube route 2 (two) times daily.          BP 114/84  Pulse 58  Temp(Src) 98.7 F (37.1 C) (Oral)  SpO2 97% Physical Exam  Constitutional:  He is awake his attentive he will shake his head yes or no in response to questions. He is nonverbal. He does not appear distressed  HENT:  Mouth/Throat:    Eyes:    Neck: No JVD present.  Cardiovascular: Normal heart sounds.   Is not tachycardic    Pulmonary/Chest:  Normal pulmonary exam tachypnea hypoxemic clear breath sounds  Abdominal:  Abdomen is soft. Some erythema over the sacrum normal active bowel sounds  No Abnormalities a G-tube  Genitourinary:  Wearing a diaper.  Neurological:  Level of consciousness is appropriate. His eyes are closed in her room. States name he opens eyes. He will follow me from side to side. He is nonverbal. He moves all 4 response to any stimuli    ED Course  Procedures (including critical care time) Labs Review Labs Reviewed  GLUCOSE, CAPILLARY - Abnormal; Notable for the following:    Glucose-Capillary 55 (*)    All other components within normal limits  CBC WITH DIFFERENTIAL - Abnormal; Notable for the following:    Platelets 143 (*)    All other components within normal limits  BASIC METABOLIC PANEL - Abnormal; Notable for the following:    Glucose, Bld 68 (*)    GFR calc non Af Amer 88 (*)    All other components within normal limits  VALPROIC ACID LEVEL - Abnormal; Notable for the following:    Valproic Acid Lvl 29.4 (*)    All other components within normal limits  URINE CULTURE  URINALYSIS, ROUTINE W REFLEX MICROSCOPIC   Imaging Review Ct Head Wo Contrast  09/01/2013   *RADIOLOGY REPORT*  Clinical Data: Headache  CT HEAD WITHOUT CONTRAST  Technique:  Contiguous axial images were obtained from the base of the skull through the vertex without contrast.  Comparison: CT from 01/03/2013  Findings: There is no acute intracranial hemorrhage or infarct.  No midline shift or mass lesion.  Gray-white matter differentiation is preserved.  Calvarium is intact.  Orbital soft tissues are normal.  Paranasal sinuses and mastoid air cells are clear.  IMPRESSION: Normal head CT with no acute intracranial process.   Original Report Authenticated By: Rise Mu, M.D.   Dg Chest Port 1 View  09/01/2013   *RADIOLOGY REPORT*  Clinical Data: Altered  mental status.  PORTABLE CHEST - 1 VIEW   Comparison: 01/02/2013.  Findings: Chronic elevation of the right diaphragm.  Borderline cardiomegaly, accentuated by technique and low lung volumes.  No acute infiltrate, edema, effusion, pneumothorax.  IMPRESSION:  1.  Low volume lungs without evidence of pneumonia or edema.  2. Chronic right diaphragmatic elevation.   Original Report Authenticated By: Tiburcio Pea    MDM   1. Anoxic brain injury    No new diagnoses or abnormalities noted tonight. His resting comfortably he still responded to my questions with shaking of his head yes or no. Remains nonverbal. Not tachycardic. Not hypoxemic. He has no electrolyte abnormalities.  His valproate level is subtherapeutic, but not toxic at 29.4 appropriate for discharge, for outpatient followup    Claudean Kinds, MD 09/01/13 [phone]

## 2013-08-31 NOTE — ED Notes (Addendum)
PER EMS- pt picked up from [facility] with c/o altered mental status x30 mins. Staff reports "pt is normally able to ambulate with assistance, follow commands and verbal communication with some confusion.  Tonight pt does follow commands but is lethargic and non-verbal."  Staff reports a change in pt's mental status.  Pt has hx of brain injury, seizures, and psychosis.  Pt is a difficult IV stick.  IV rn paged.

## 2013-08-31 NOTE — ED Notes (Signed)
Bed: WA09 Expected date: 08/31/13 Expected time: 9:27 PM Means of arrival: Ambulance Comments: Lethargic/quadraplegic

## 2013-09-01 ENCOUNTER — Emergency Department (HOSPITAL_COMMUNITY): Payer: Medicare Other

## 2013-09-01 LAB — CBC WITH DIFFERENTIAL/PLATELET
Basophils Relative: 1 % (ref 0–1)
Eosinophils Absolute: 0.2 10*3/uL (ref 0.0–0.7)
Eosinophils Relative: 3 % (ref 0–5)
HCT: 44.7 % (ref 39.0–52.0)
Hemoglobin: 15 g/dL (ref 13.0–17.0)
MCH: 29.9 pg (ref 26.0–34.0)
MCHC: 33.6 g/dL (ref 30.0–36.0)
MCV: 89 fL (ref 78.0–100.0)
Monocytes Absolute: 0.7 10*3/uL (ref 0.1–1.0)
Monocytes Relative: 9 % (ref 3–12)
RDW: 13 % (ref 11.5–15.5)

## 2013-09-01 LAB — BASIC METABOLIC PANEL
BUN: 18 mg/dL (ref 6–23)
Calcium: 8.9 mg/dL (ref 8.4–10.5)
Chloride: 103 mEq/L (ref 96–112)
Creatinine, Ser: 1.01 mg/dL (ref 0.50–1.35)
GFR calc Af Amer: >90 mL/min (ref >90)

## 2013-09-01 LAB — URINALYSIS, ROUTINE W REFLEX MICROSCOPIC
Bilirubin Urine: NEGATIVE
Ketones, ur: NEGATIVE mg/dL
Nitrite: NEGATIVE
Protein, ur: NEGATIVE mg/dL
pH: 6.5 (ref 5.0–8.0)

## 2013-09-01 LAB — VALPROIC ACID LEVEL: Valproic Acid Lvl: 29.4 ug/mL — ABNORMAL LOW (ref 50.0–100.0)

## 2013-09-01 NOTE — ED Notes (Signed)
[facility] called to transport pt  

## 2013-09-01 NOTE — ED Notes (Signed)
Lab called to draw blood on pt, pt has been stuck multiple times by phlebotomy without success at this time

## 2013-09-01 NOTE — ED Notes (Signed)
Gave report to Rosie RN

## 2013-09-01 NOTE — ED Notes (Signed)
Unsuccessful lab draw by both phlebotomists and two RNs. Main lab coming to attempt lab draw.

## 2013-09-02 LAB — URINE CULTURE: Culture: NO GROWTH

## 2013-09-02 IMAGING — XA IR REPLACE G-TUBE/COLONIC TUBE
1 series · 3 of 3 positions shown · IV contrast (omnipaque)
Comparison: None.

CLINICAL DATA: Dislodgement of chronic indwelling gastrostomy tube.

GASTROSTOMY CATHETER REPLACEMENT
Fluoro time:  6 seconds.
Contrast:  10 ml Omnipaque-LRR

[Series 1: run · 3 of 3 slices shown]
[im 1/3]
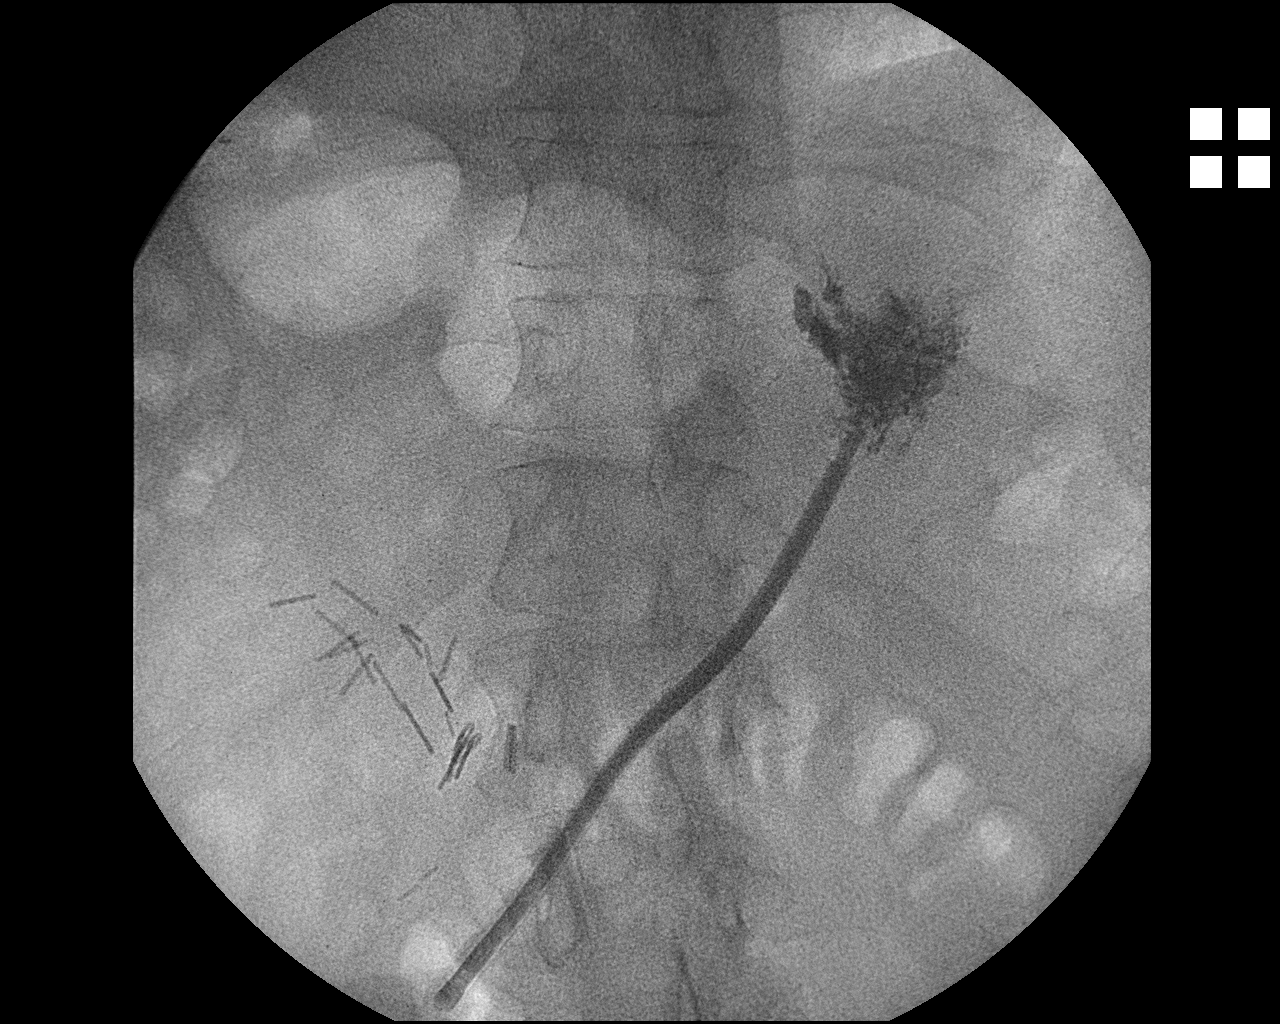
[im 2/3]
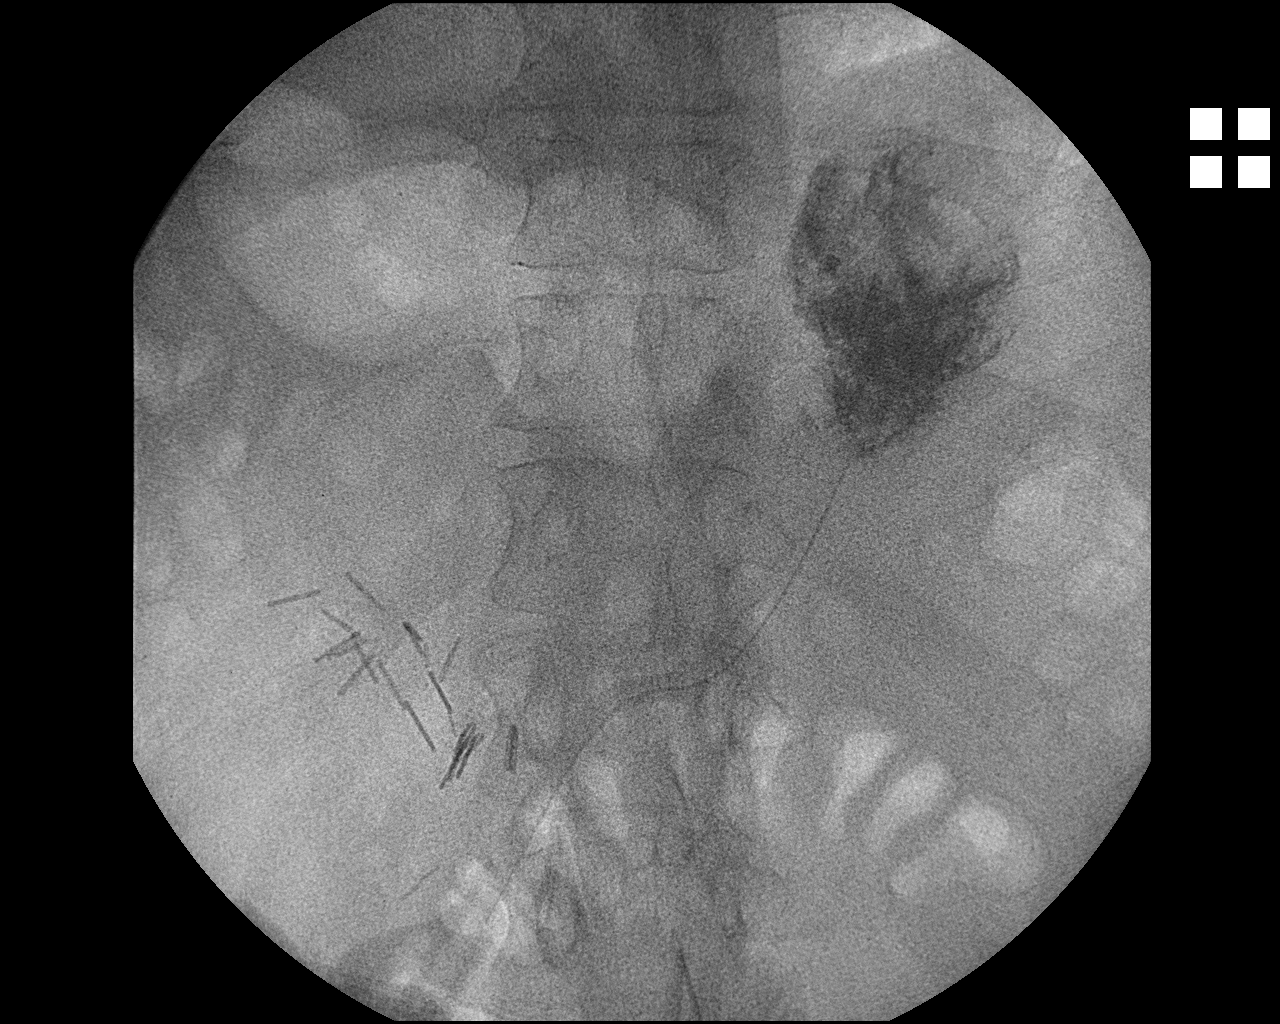
[im 3/3]
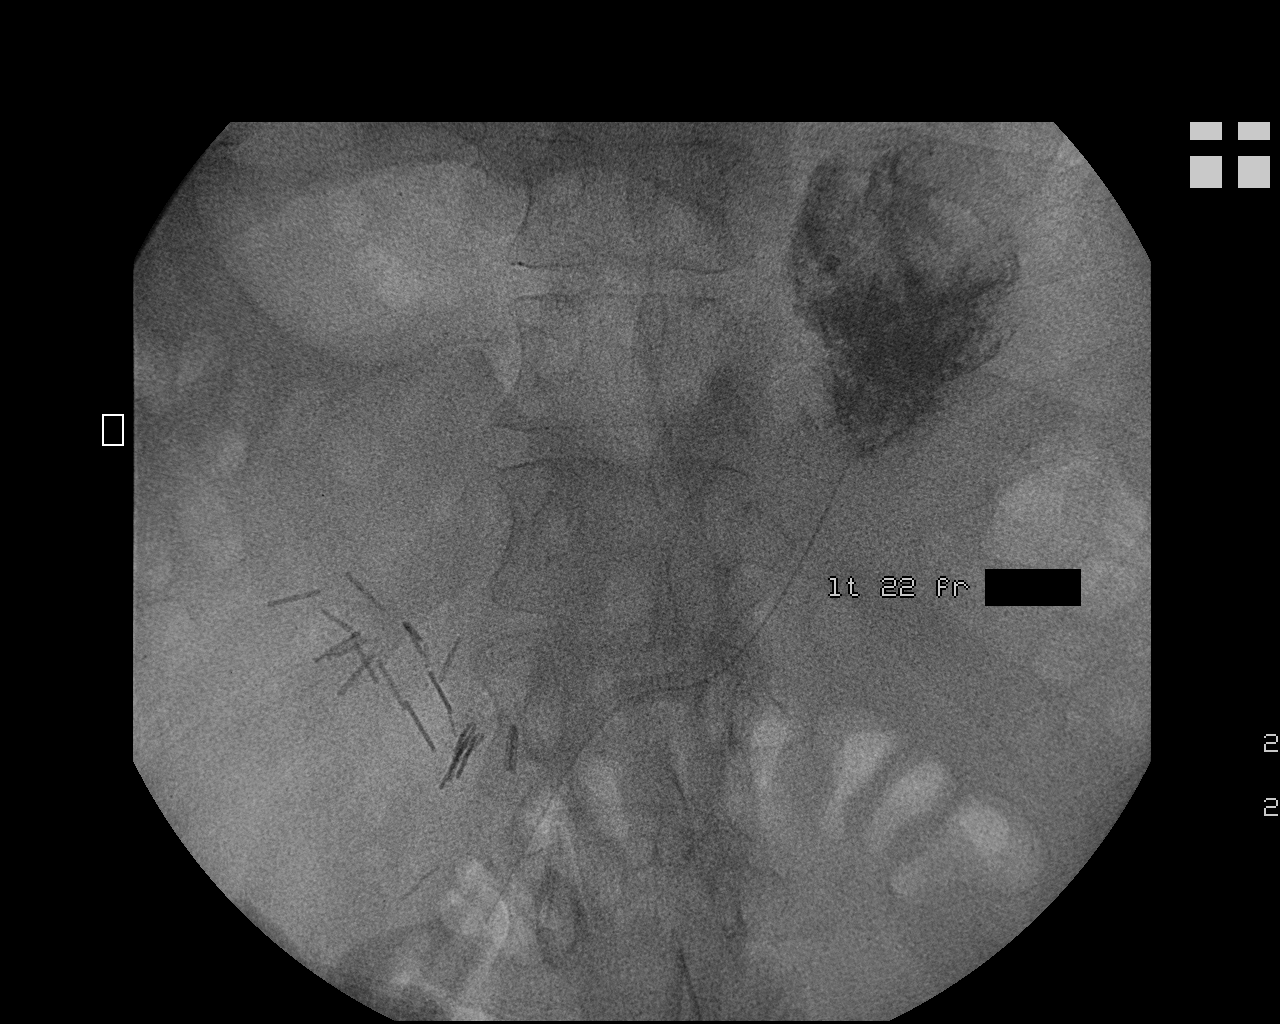

[3 of 3 positions shown; findings below may reference images not displayed]

FINDINGS: Indwelling Foley catheter was removed.  A new 22-French
balloon retention gastrostomy tube was advanced through the
preexisting tract.  The balloon was inflated with a 8 ml saline.
The tube was injected with contrast material under fluoroscopy
demonstrating tip positioning within the body of the stomach.
IMPRESSION: Replacement of gastrostomy tube with new 22-French balloon
retention catheter.  The tip lies in the body of the stomach.

## 2013-09-18 ENCOUNTER — Non-Acute Institutional Stay (SKILLED_NURSING_FACILITY): Payer: Medicare Other | Admitting: Internal Medicine

## 2013-09-18 DIAGNOSIS — E039 Hypothyroidism, unspecified: Secondary | ICD-10-CM

## 2013-09-18 DIAGNOSIS — K5909 Other constipation: Secondary | ICD-10-CM

## 2013-09-18 DIAGNOSIS — D638 Anemia in other chronic diseases classified elsewhere: Secondary | ICD-10-CM

## 2013-09-18 DIAGNOSIS — G931 Anoxic brain damage, not elsewhere classified: Secondary | ICD-10-CM

## 2013-09-18 DIAGNOSIS — K5900 Constipation, unspecified: Secondary | ICD-10-CM

## 2013-09-18 NOTE — Progress Notes (Signed)
PROGRESS NOTE  DATE: 09-18-13  FACILITY: [facility]  LEVEL OF CARE: SNF  Routine Visit  CHIEF COMPLAINT:  Manage hypothyroidism and constipation  HISTORY OF PRESENT ILLNESS:  REASSESSMENT OF ONGOING PROBLEM(S):  HYPOTHYROIDISM: The hypothyroidism remains stable. No complications noted from the medications presently being used.  The staff denyconstipation.  Last TSH 9.27 in 5/14, 6/14 TSH 5.04. Patient is a poor historian secondary to anoxic brain injury.  CONSTIPATION: The constipation remains stable. No complications from the medications presently being used. Staff deny ongoing constipation, abdominal pain, nausea or vomiting.  PAST MEDICAL HISTORY : Reviewed.  No changes.  CURRENT MEDICATIONS: Reviewed per [facility]  REVIEW OF SYSTEMS: Unobtainable secondary to anoxic brain injury  PHYSICAL EXAMINATION  VS:  T 97.3       P 80     RR 18     BP 124/80   POX %     WT (Lb) 228  GENERAL: no acute distress, obese body habitus NECK: supple, trachea midline, no neck masses, no thyroid tenderness, no thyromegaly RESPIRATORY: breathing is even & unlabored, BS CTAB CARDIAC: RRR, no murmur,no extra heart sounds, no edema GI: abdomen soft, normal BS, no masses, no tenderness, no hepatomegaly, no splenomegaly PSYCHIATRIC: the patient is lethargic, decreased affect & mood  LABS/RADIOLOGY:  8-14 Depakote level 11 5/14 fasting lipid panel normal, including A1c 4.9, CMP normal, platelets 139 otherwise CBC normal 2/14 Depakote 6 1/14 he will profile normal, platelets 121 otherwise CBC normal, BMP normal  ASSESSMENT/PLAN:  hypothyroidism-stable. Recheck TSH constipation-well-controlled. anoxic brain injury-continue supportive care.   anemia of chronic disease-continue iron. Cardiomyopathy-well compensated. Depression-abilify was started  CPT CODE: [id number]

## 2013-09-19 ENCOUNTER — Emergency Department (HOSPITAL_COMMUNITY)
Admission: EM | Admit: 2013-09-19 | Discharge: 2013-09-20 | Disposition: A | Discharge status: Home / Self Care | Payer: Medicare Other | Attending: Emergency Medicine | Admitting: Emergency Medicine

## 2013-09-19 ENCOUNTER — Emergency Department (HOSPITAL_COMMUNITY): Payer: Medicare Other

## 2013-09-19 DIAGNOSIS — E039 Hypothyroidism, unspecified: Secondary | ICD-10-CM | POA: Insufficient documentation

## 2013-09-19 DIAGNOSIS — F411 Generalized anxiety disorder: Secondary | ICD-10-CM | POA: Insufficient documentation

## 2013-09-19 DIAGNOSIS — K219 Gastro-esophageal reflux disease without esophagitis: Secondary | ICD-10-CM | POA: Insufficient documentation

## 2013-09-19 DIAGNOSIS — E2749 Other adrenocortical insufficiency: Secondary | ICD-10-CM | POA: Insufficient documentation

## 2013-09-19 DIAGNOSIS — Z8669 Personal history of other diseases of the nervous system and sense organs: Secondary | ICD-10-CM | POA: Insufficient documentation

## 2013-09-19 DIAGNOSIS — Z880 Allergy status to penicillin: Secondary | ICD-10-CM | POA: Insufficient documentation

## 2013-09-19 DIAGNOSIS — I252 Old myocardial infarction: Secondary | ICD-10-CM | POA: Insufficient documentation

## 2013-09-19 DIAGNOSIS — Z87440 Personal history of urinary (tract) infections: Secondary | ICD-10-CM | POA: Insufficient documentation

## 2013-09-19 DIAGNOSIS — Z8701 Personal history of pneumonia (recurrent): Secondary | ICD-10-CM | POA: Insufficient documentation

## 2013-09-19 DIAGNOSIS — G40909 Epilepsy, unspecified, not intractable, without status epilepticus: Secondary | ICD-10-CM | POA: Insufficient documentation

## 2013-09-19 DIAGNOSIS — Z79899 Other long term (current) drug therapy: Secondary | ICD-10-CM | POA: Insufficient documentation

## 2013-09-19 DIAGNOSIS — I4891 Unspecified atrial fibrillation: Secondary | ICD-10-CM | POA: Insufficient documentation

## 2013-09-19 DIAGNOSIS — Z91040 Latex allergy status: Secondary | ICD-10-CM | POA: Insufficient documentation

## 2013-09-19 DIAGNOSIS — K9423 Gastrostomy malfunction: Secondary | ICD-10-CM | POA: Insufficient documentation

## 2013-09-19 DIAGNOSIS — Y833 Surgical operation with formation of external stoma as the cause of abnormal reaction of the patient, or of later complication, without mention of misadventure at the time of the procedure: Secondary | ICD-10-CM | POA: Insufficient documentation

## 2013-09-19 DIAGNOSIS — Z905 Acquired absence of kidney: Secondary | ICD-10-CM | POA: Insufficient documentation

## 2013-09-19 DIAGNOSIS — H409 Unspecified glaucoma: Secondary | ICD-10-CM | POA: Insufficient documentation

## 2013-09-19 MED ORDER — IOHEXOL 300 MG/ML  SOLN
50.0000 mL | Freq: Once | INTRAMUSCULAR | Status: AC | PRN
  Administered 2013-09-19: 50 mL via ORAL

## 2013-09-19 NOTE — ED Notes (Signed)
Pt brought to ED via [facility] for peg tube verification.  Pt pulled peg tube out earlier and another one was replaced.  Just needs x-ray to verify placement.  Pt has no complaints

## 2013-09-19 NOTE — ED Provider Notes (Signed)
CSN: [id number]     Arrival date & time 09/19/13  2109 History   First MD Initiated Contact with Patient 09/19/13 2112     Chief Complaint  Patient presents with  . Peg tube verification    (Consider location/radiation/quality/duration/timing/severity/associated sxs/prior Treatment) HPI Comments: Pt brought to the ED for peg tube verification. LEVEL 5 CAVEAT FOR BASELINE ALTERED MENTAL STATUS. Allegedly he pulled his tube out, and the staff replaced the tube immediately. Pt has no complains.  The history is provided by medical records and the patient.    Past Medical History  Diagnosis Date  . Addison disease   . Anoxic brain injury   . A-fib   . Seizure disorder   . Dysphagia   . History of recurrent UTIs   . Hyperlipemia   . Aspiration pneumonia   . Mental disorder   . Anxiety   . Reflux   . Dysphagia   . Glucocorticoid deficiency   . Atrial fibrillation   . Glaucoma   . Hypothyroidism   . Encephalopathy   . Quadriplegia   . Addison disease   . Myocardial infarction    Past Surgical History  Procedure Laterality Date  . Nephrectomy  unknown  . Peg tube placement     No family history on file. History  Substance Use Topics  . Smoking status: Never Smoker   . Smokeless tobacco: Never Used  . Alcohol Use: No    Review of Systems  Constitutional: Negative for activity change.  Gastrointestinal: Negative for nausea, vomiting and abdominal pain.    Allergies  Ativan; Latex; Morphine and related; Penicillins; Pyridium; and Soy allergy  Home Medications   Current Outpatient Rx  Name  Route  Sig  Dispense  Refill  . ARIPiprazole (ABILIFY) 5 MG tablet   Oral   Take 5 mg by mouth daily.         . diphenhydrAMINE (BENADRYL) 25 mg capsule   PEG Tube   25 mg by PEG Tube route every 6 (six) hours as needed. For allergic reaction         . divalproex (DEPAKOTE SPRINKLE) 125 MG capsule   Oral   Take 375 mg by mouth 2 (two) times daily.          .  feeding supplement (PRO-STAT SUGAR FREE 64) LIQD   PEG Tube   30 mLs by PEG Tube route 2 (two) times daily.          . ferrous sulfate 220 (44 FE) MG/5ML solution   PEG Tube   330 mg by PEG Tube route daily.          . fish oil-omega-3 fatty acids 1000 MG capsule   PEG Tube   1 g by PEG Tube route 2 (two) times daily. Via peg tube         . ipratropium-albuterol (DUONEB) 0.5-2.5 (3) MG/3ML SOLN   Nebulization   Take 3 mLs by nebulization every 6 (six) hours as needed. For shortness of breath         . levothyroxine (SYNTHROID, LEVOTHROID) 25 MCG tablet   Oral   Take 25 mcg by mouth daily before breakfast.         . methylcellulose (ARTIFICIAL TEARS) 1 % ophthalmic solution   Both Eyes   Place 1 drop into both eyes 4 (four) times daily.           . metoprolol tartrate (LOPRESSOR) 25 mg/10 mL SUSP   PEG Tube  12.5 mg by PEG Tube route 2 (two) times daily.          . multivitamin/minerals (GOLDEN AGE) LIQD   PEG Tube   5 mLs by PEG Tube route daily.         . pantoprazole sodium (PROTONIX) 40 mg/20 mL PACK   PEG Tube   40 mg by PEG Tube route daily at 12 noon.          . polyethylene glycol (MIRALAX / GLYCOLAX) packet   PEG Tube   17 g by PEG Tube route 2 (two) times daily. Mix 17gm 4-8 ounces of liquid         . traZODone (DESYREL) 50 MG tablet   Oral   Take 50 mg by mouth 3 (three) times daily as needed (ANXIETY).          Marland Kitchen venlafaxine (EFFEXOR) 37.5 MG tablet   PEG Tube   37.5 mg by PEG Tube route 2 (two) times daily.          BP 117/68  Pulse 71  Temp(Src) 98.1 F (36.7 C) (Oral)  Resp 16  SpO2 98% Physical Exam  Nursing note and vitals reviewed. Constitutional: He appears well-developed.  HENT:  Head: Atraumatic.  Cardiovascular: Normal rate.   Pulmonary/Chest: Effort normal.  Abdominal: Soft. He exhibits no distension. There is no tenderness.    ED Course  Procedures (including critical care time) Labs Review Labs  Reviewed - No data to display Imaging Review No results found.  MDM  No diagnosis found.  PEG tube accidentally pulled out by patient, replaced by nursing home staff. KUB confirms the location. Will d/c.  Derwood Kaplan, MD 09/20/13 [phone]

## 2013-09-20 ENCOUNTER — Ambulatory Visit (HOSPITAL_COMMUNITY)
Admission: RE | Admit: 2013-09-20 | Discharge: 2013-09-20 | Disposition: A | Discharge status: Home / Self Care | Payer: Medicare Other | Source: Ambulatory Visit | Attending: Internal Medicine | Admitting: Internal Medicine

## 2013-09-20 ENCOUNTER — Other Ambulatory Visit (HOSPITAL_BASED_OUTPATIENT_CLINIC_OR_DEPARTMENT_OTHER): Payer: Self-pay | Admitting: Internal Medicine

## 2013-09-20 DIAGNOSIS — Z431 Encounter for attention to gastrostomy: Secondary | ICD-10-CM | POA: Insufficient documentation

## 2013-09-20 DIAGNOSIS — IMO0002 Reserved for concepts with insufficient information to code with codable children: Secondary | ICD-10-CM

## 2013-09-20 MED ORDER — IOHEXOL 300 MG/ML  SOLN
50.0000 mL | Freq: Once | INTRAMUSCULAR | Status: AC | PRN
  Administered 2013-09-20: 20 mL via INTRAVENOUS

## 2013-09-21 ENCOUNTER — Non-Acute Institutional Stay (SKILLED_NURSING_FACILITY): Payer: Medicare Other | Admitting: Internal Medicine

## 2013-09-21 ENCOUNTER — Telehealth (HOSPITAL_COMMUNITY): Payer: Self-pay | Admitting: *Deleted

## 2013-09-21 DIAGNOSIS — E0590 Thyrotoxicosis, unspecified without thyrotoxic crisis or storm: Secondary | ICD-10-CM

## 2013-09-21 NOTE — Telephone Encounter (Signed)
Spoke with pt's mother Talbert Forest.  She says he is back at the nursing home and doing fine to best of her knowledge.

## 2013-09-22 NOTE — Progress Notes (Signed)
PROGRESS NOTE  DATE: 09/21/2013  FACILITY:  [facility]  LEVEL OF CARE: SNF (31)  Acute Visit  CHIEF COMPLAINT:  Manage hypothyroidism  HISTORY OF PRESENT ILLNESS: I was requested by the staff to assess the patient regarding above problem(s):  HYPOTHYROIDISM: The hypothyroidism is stable. No complications noted from the medications presently being used.  The staff deny fatigue or constipation.  Last TSH 5.45 on 09-19-13. In 5/14 TSH 9.27. Patient is a poor historian due to history of anoxic brain injury.  PAST MEDICAL HISTORY : Reviewed.  No changes.  CURRENT MEDICATIONS: Reviewed per [facility]  PHYSICAL EXAMINATION  GENERAL: no acute distress, obese body habitus NECK: supple, trachea midline, no neck masses, no thyroid tenderness, no thyromegaly CARDIAC: RRR, no murmur,no extra heart sounds, no edema  LABS/RADIOLOGY: See history of present illness  ASSESSMENT/PLAN:  Hypothyroidism-unstable problem. TSH improved. Continue current Synthroid dose. Recheck TSH in 8 weeks.  CPT CODE: [id number]

## 2013-10-03 IMAGING — CT CT HEAD W/O CM
2 series · 16 of 30 positions shown, 20 images · non-contrast
Comparison: CT from 01/03/2013

CLINICAL DATA: Headache

CT HEAD WITHOUT CONTRAST
TECHNIQUE: Contiguous axial images were obtained from the base of
the skull through the vertex without contrast.

[Series 2: head w/o · axial · non-contrast · 0.48mm/px · z∈[+1191,+1316]mm · 13 of 31 slices shown, 17 images]
[im 3/31  brain]
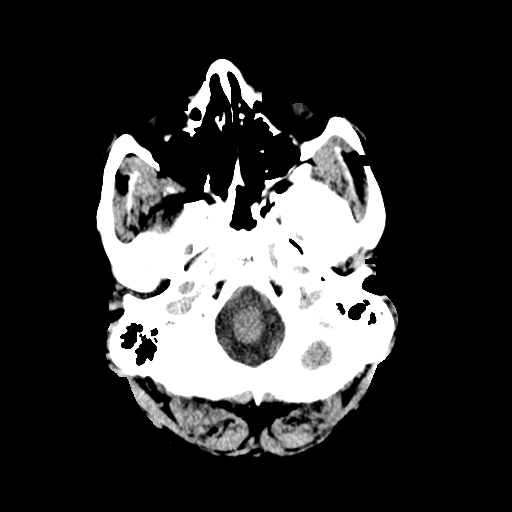
[im 3/31  bone]
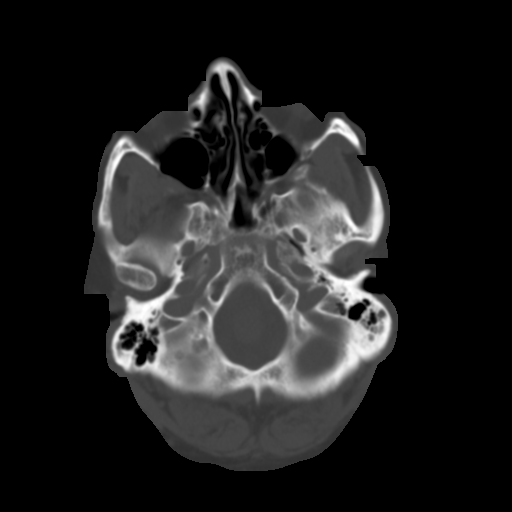
[im 5/31  brain]
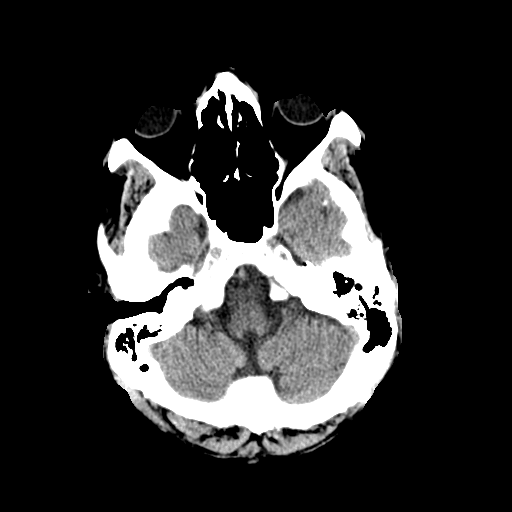
[im 7/31  brain]
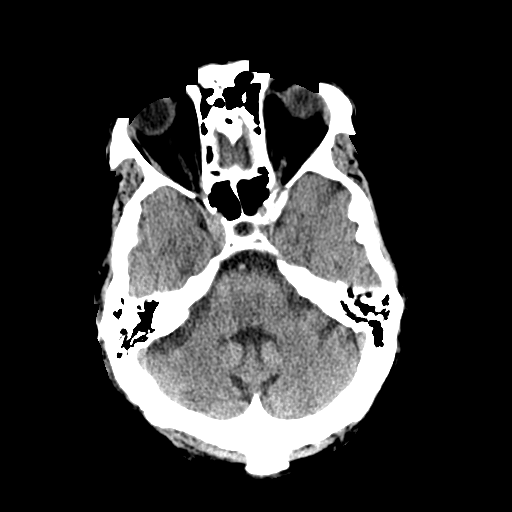
[im 9/31  brain]
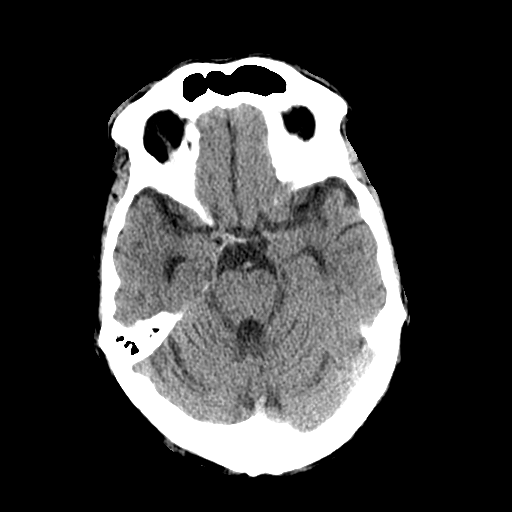
[im 11/31  brain]
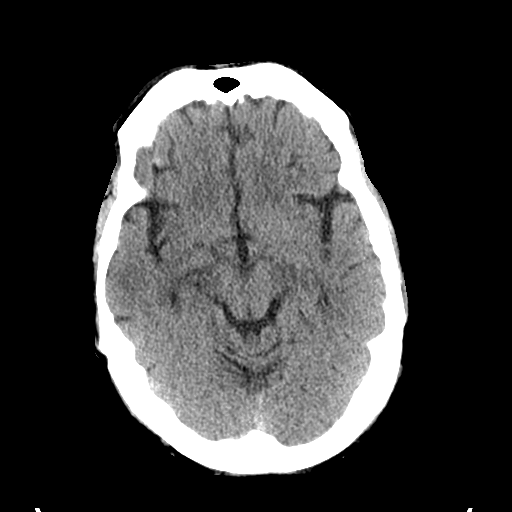
[im 11/31  bone]
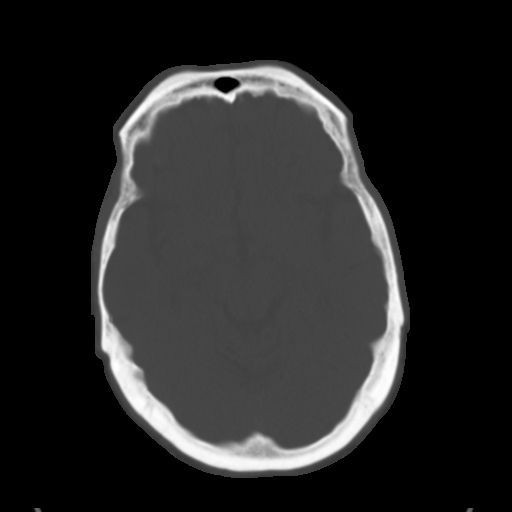
[im 13/31  brain]
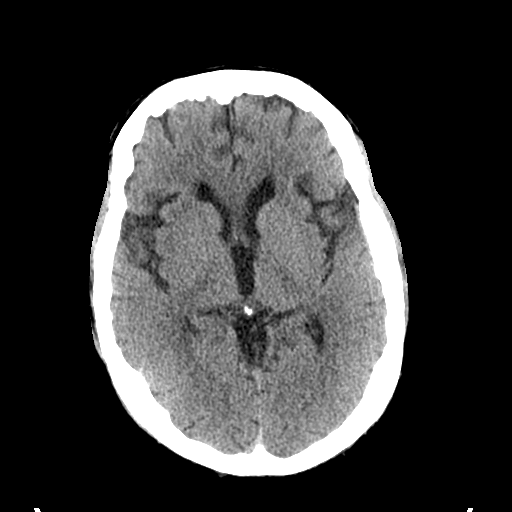
[im 16/31  brain]
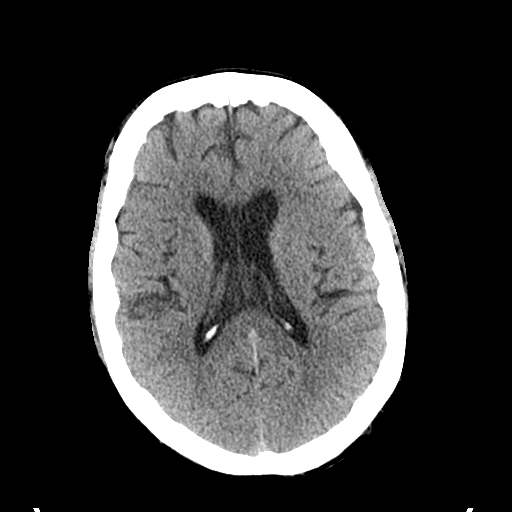
[im 18/31  brain]
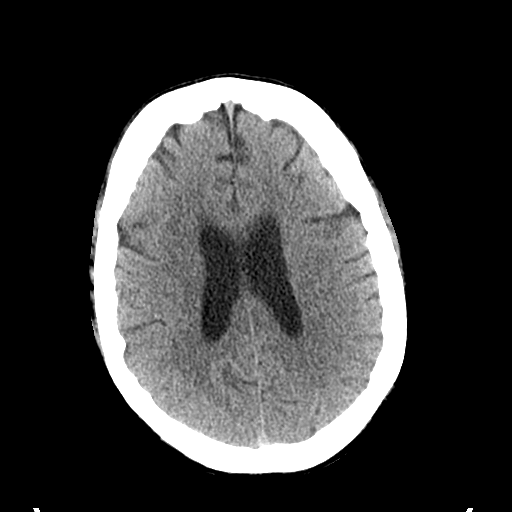
[im 20/31  brain]
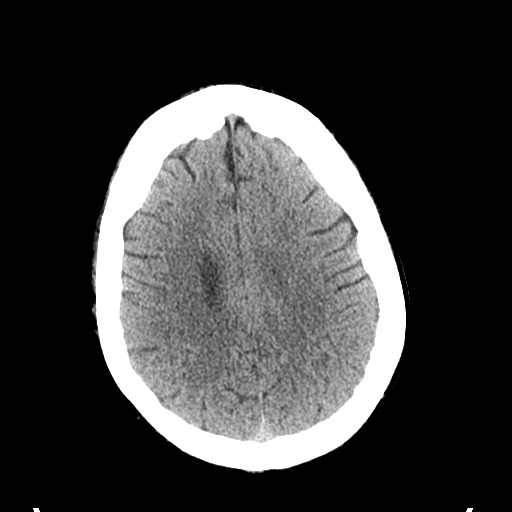
[im 20/31  bone]
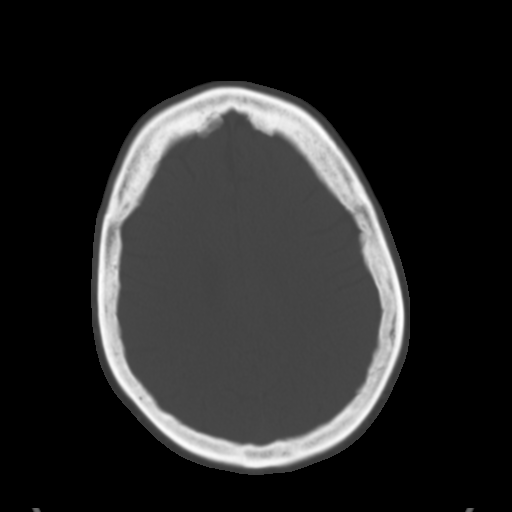
[im 22/31  brain]
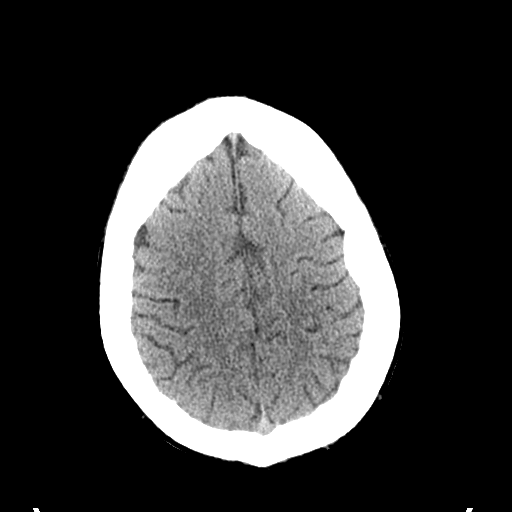
[im 24/31  brain]
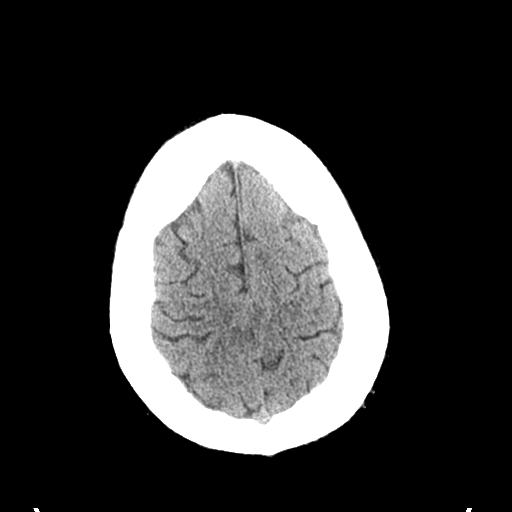
[im 26/31  brain]
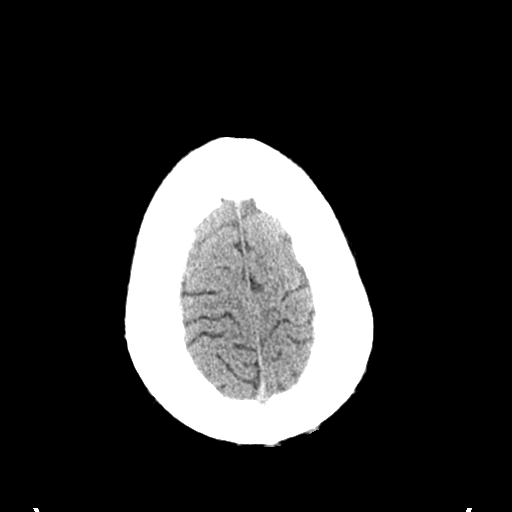
[im 28/31  brain]
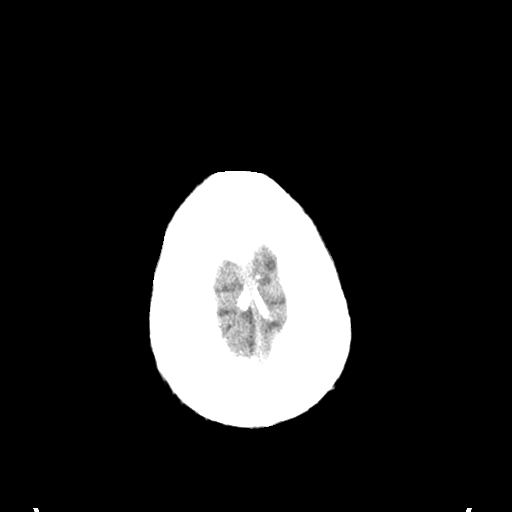
[im 28/31  bone]
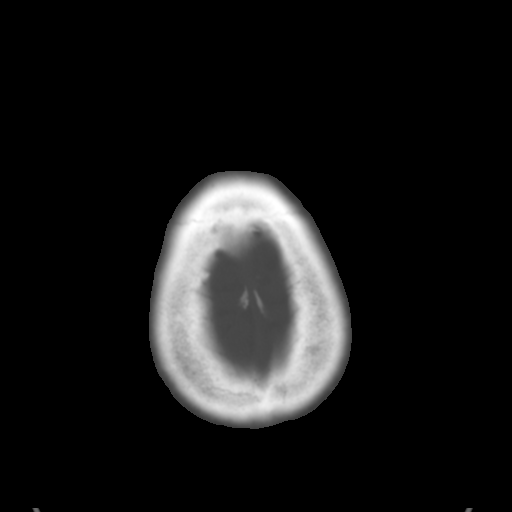

[Series 3: bone windows · axial · 0.48mm/px · z∈[+1191,+1231]mm · 3 of 31 slices shown]
[im 3/31  bone]
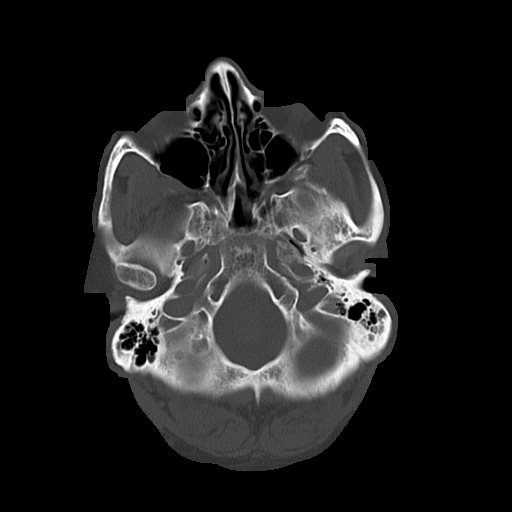
[im 7/31  bone]
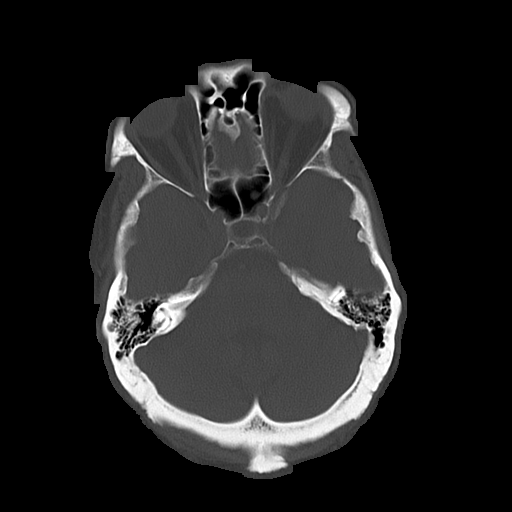
[im 11/31  bone]
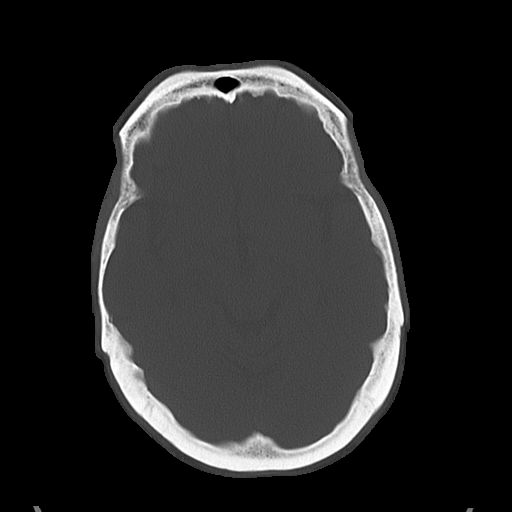

[16 of 30 positions shown; findings below may reference images not displayed]

FINDINGS: There is no acute intracranial hemorrhage or infarct.  No
midline shift or mass lesion.  Gray-white matter differentiation is
preserved.  Calvarium is intact.

Orbital soft tissues are normal.  Paranasal sinuses and mastoid air
cells are clear.
IMPRESSION: Normal head CT with no acute intracranial process.

## 2013-10-04 IMAGING — CR DG CHEST 1V PORT
1 series · 1 of 1 positions shown · non-contrast
Comparison: 01/02/2013.

CLINICAL DATA: Altered mental status.

PORTABLE CHEST - 1 VIEW

[AP]
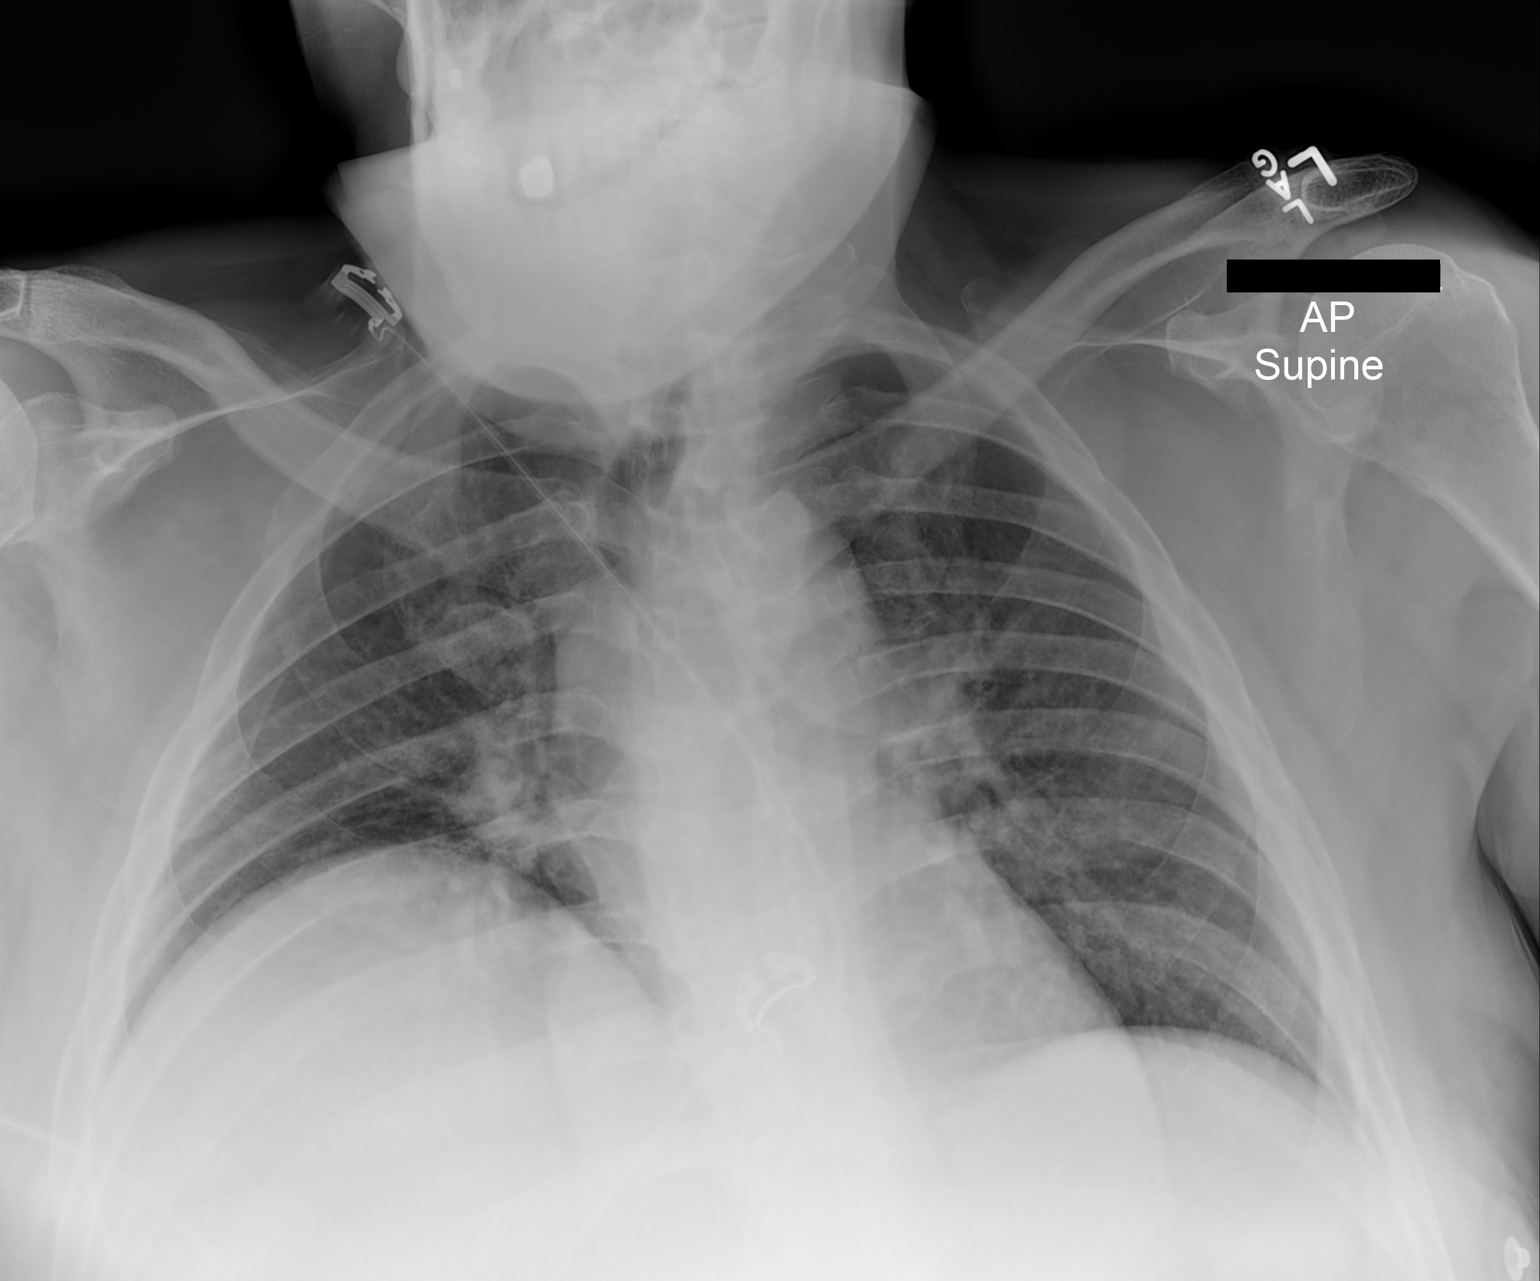

[1 of 1 positions shown; findings below may reference images not displayed]

FINDINGS: Chronic elevation of the right diaphragm.  Borderline
cardiomegaly, accentuated by technique and low lung volumes.  No
acute infiltrate, edema, effusion, pneumothorax.
IMPRESSION: 1.  Low volume lungs without evidence of pneumonia or edema.

2. Chronic right diaphragmatic elevation.

## 2013-10-09 ENCOUNTER — Non-Acute Institutional Stay (SKILLED_NURSING_FACILITY): Payer: Medicare Other | Admitting: Internal Medicine

## 2013-10-09 DIAGNOSIS — K5900 Constipation, unspecified: Secondary | ICD-10-CM

## 2013-10-09 DIAGNOSIS — D638 Anemia in other chronic diseases classified elsewhere: Secondary | ICD-10-CM

## 2013-10-09 DIAGNOSIS — G931 Anoxic brain damage, not elsewhere classified: Secondary | ICD-10-CM

## 2013-10-09 DIAGNOSIS — K5909 Other constipation: Secondary | ICD-10-CM

## 2013-10-09 DIAGNOSIS — E039 Hypothyroidism, unspecified: Secondary | ICD-10-CM

## 2013-10-11 NOTE — Progress Notes (Signed)
PROGRESS NOTE  DATE: 10-09-13  FACILITY: [facility]  LEVEL OF CARE: SNF  Routine Visit  CHIEF COMPLAINT:  Manage hypothyroidism and constipation  HISTORY OF PRESENT ILLNESS:  REASSESSMENT OF ONGOING PROBLEM(S):  HYPOTHYROIDISM: The hypothyroidism remains stable. No complications noted from the medications presently being used.  The staff deny constipation.  Last TSH 9.27 in 5/14, 6/14 TSH 5.04, in 9/14 TSH 5.45. Patient is a poor historian secondary to anoxic brain injury.  CONSTIPATION: The constipation remains stable. No complications from the medications presently being used. Staff deny ongoing constipation, abdominal pain, nausea or vomiting.  PAST MEDICAL HISTORY : Reviewed.  No changes.  CURRENT MEDICATIONS: Reviewed per [facility]  REVIEW OF SYSTEMS: Unobtainable secondary to anoxic brain injury  PHYSICAL EXAMINATION  VS:  T 97.8       P 72     RR 22     BP 124/60   POX %     WT (Lb) 228  GENERAL: no acute distress, obese body habitus NECK: supple, trachea midline, no neck masses, no thyroid tenderness, no thyromegaly RESPIRATORY: breathing is even & unlabored, BS CTAB CARDIAC: RRR, no murmur,no extra heart sounds, no edema GI: abdomen soft, normal BS, no masses, no tenderness, no hepatomegaly, no splenomegaly PSYCHIATRIC: the patient is lethargic, decreased affect & mood  LABS/RADIOLOGY:  8-14 Depakote level 11 5/14 fasting lipid panel normal, including A1c 4.9, CMP normal, platelets 139 otherwise CBC normal 2/14 Depakote 6 1/14 he will profile normal, platelets 121 otherwise CBC normal, BMP normal  ASSESSMENT/PLAN:  hypothyroidism-stable.  constipation-well-controlled. anoxic brain injury-continue supportive care.   anemia of chronic disease-continue iron. Cardiomyopathy-well compensated. Depression-on abilify.  CPT CODE: 16109

## 2013-10-22 IMAGING — CR DG ABDOMEN 1V
2 series · 2 of 2 positions shown · non-contrast
Comparison: Chest in two views abdomen 01/02/2013. Two fluoroscopic
views of the abdomen 07/31/2013.

CLINICAL DATA: Replacement of PEG tube.

EXAM:
ABDOMEN - 1 VIEW

[t abdomen supine (1 of 2)]
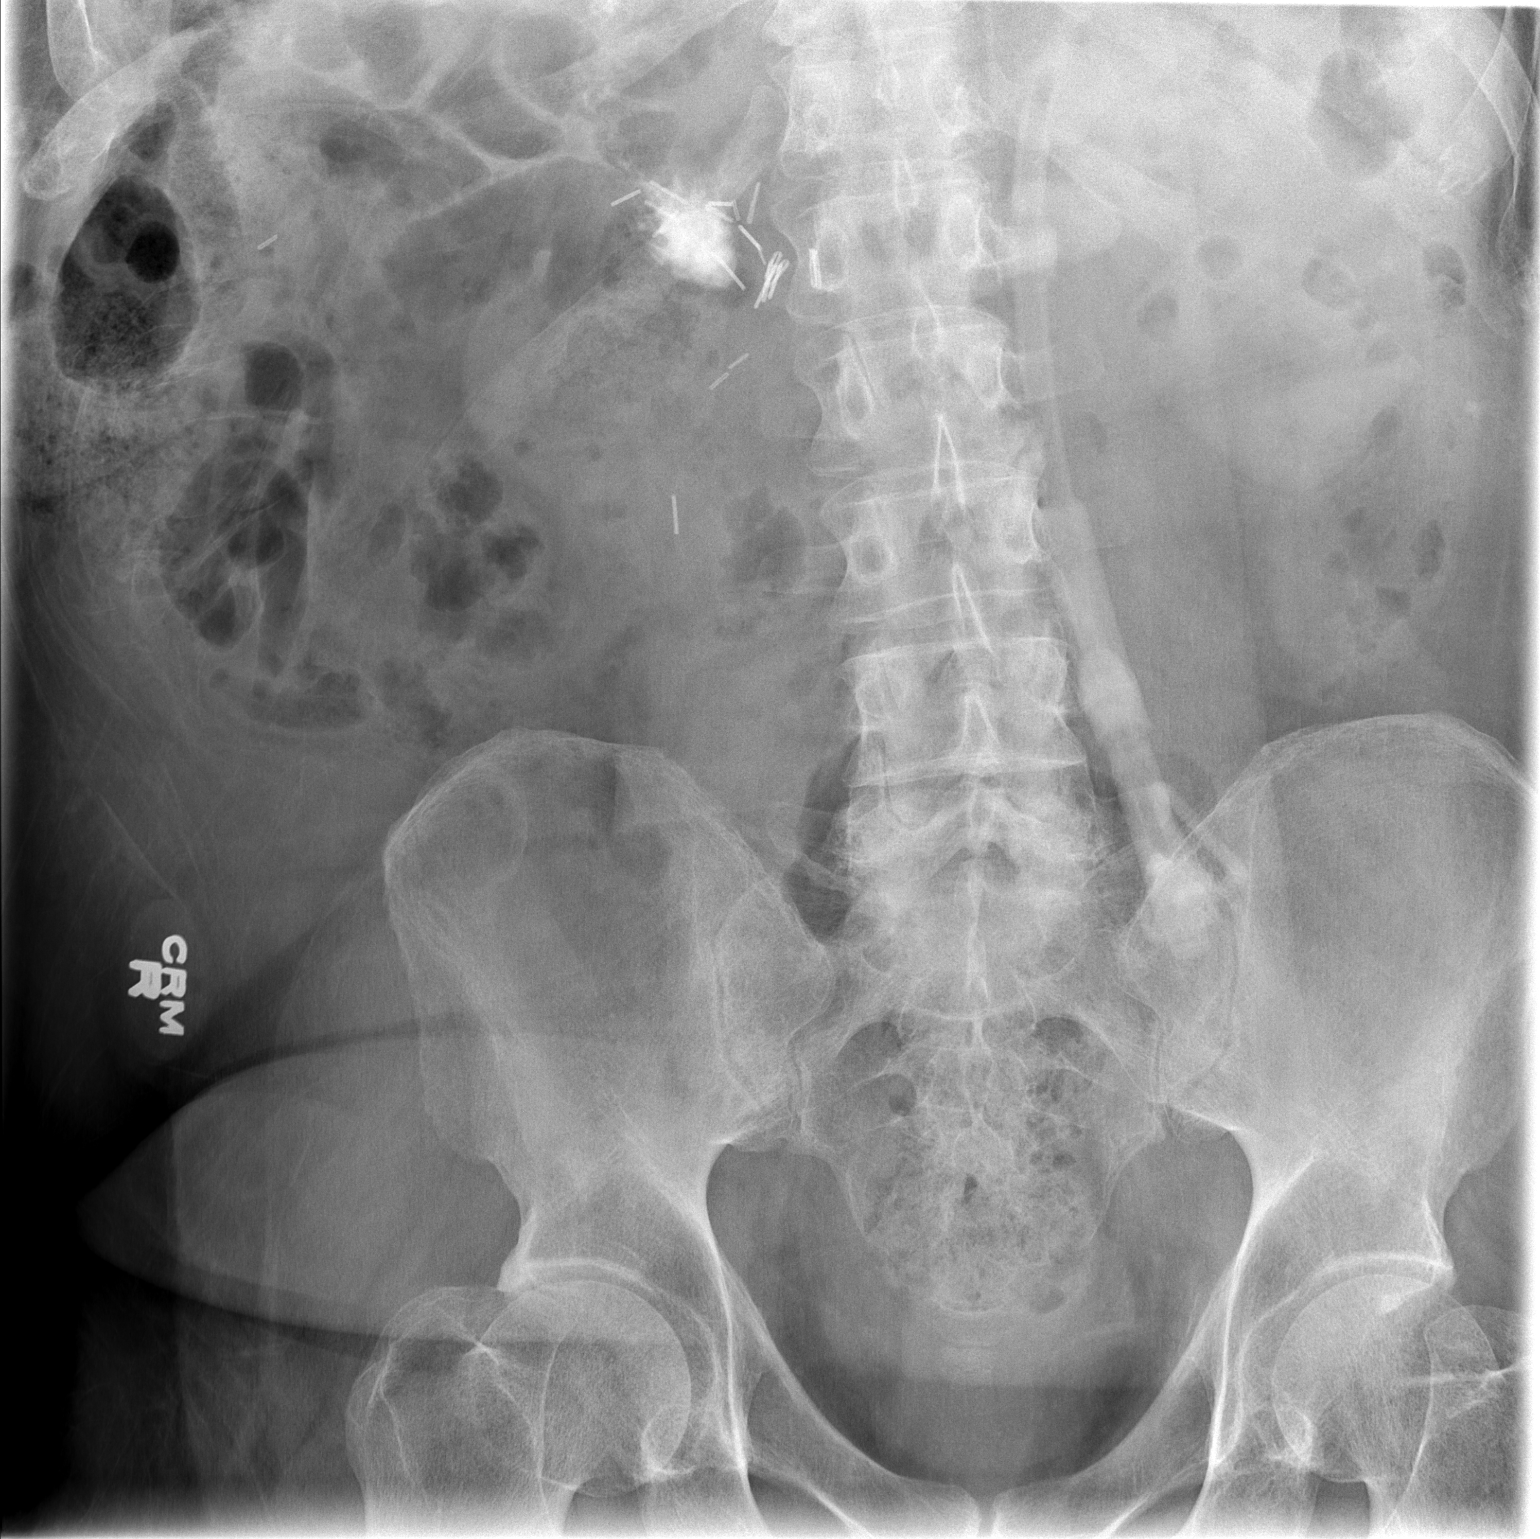

[t abdomen supine (2 of 2)]
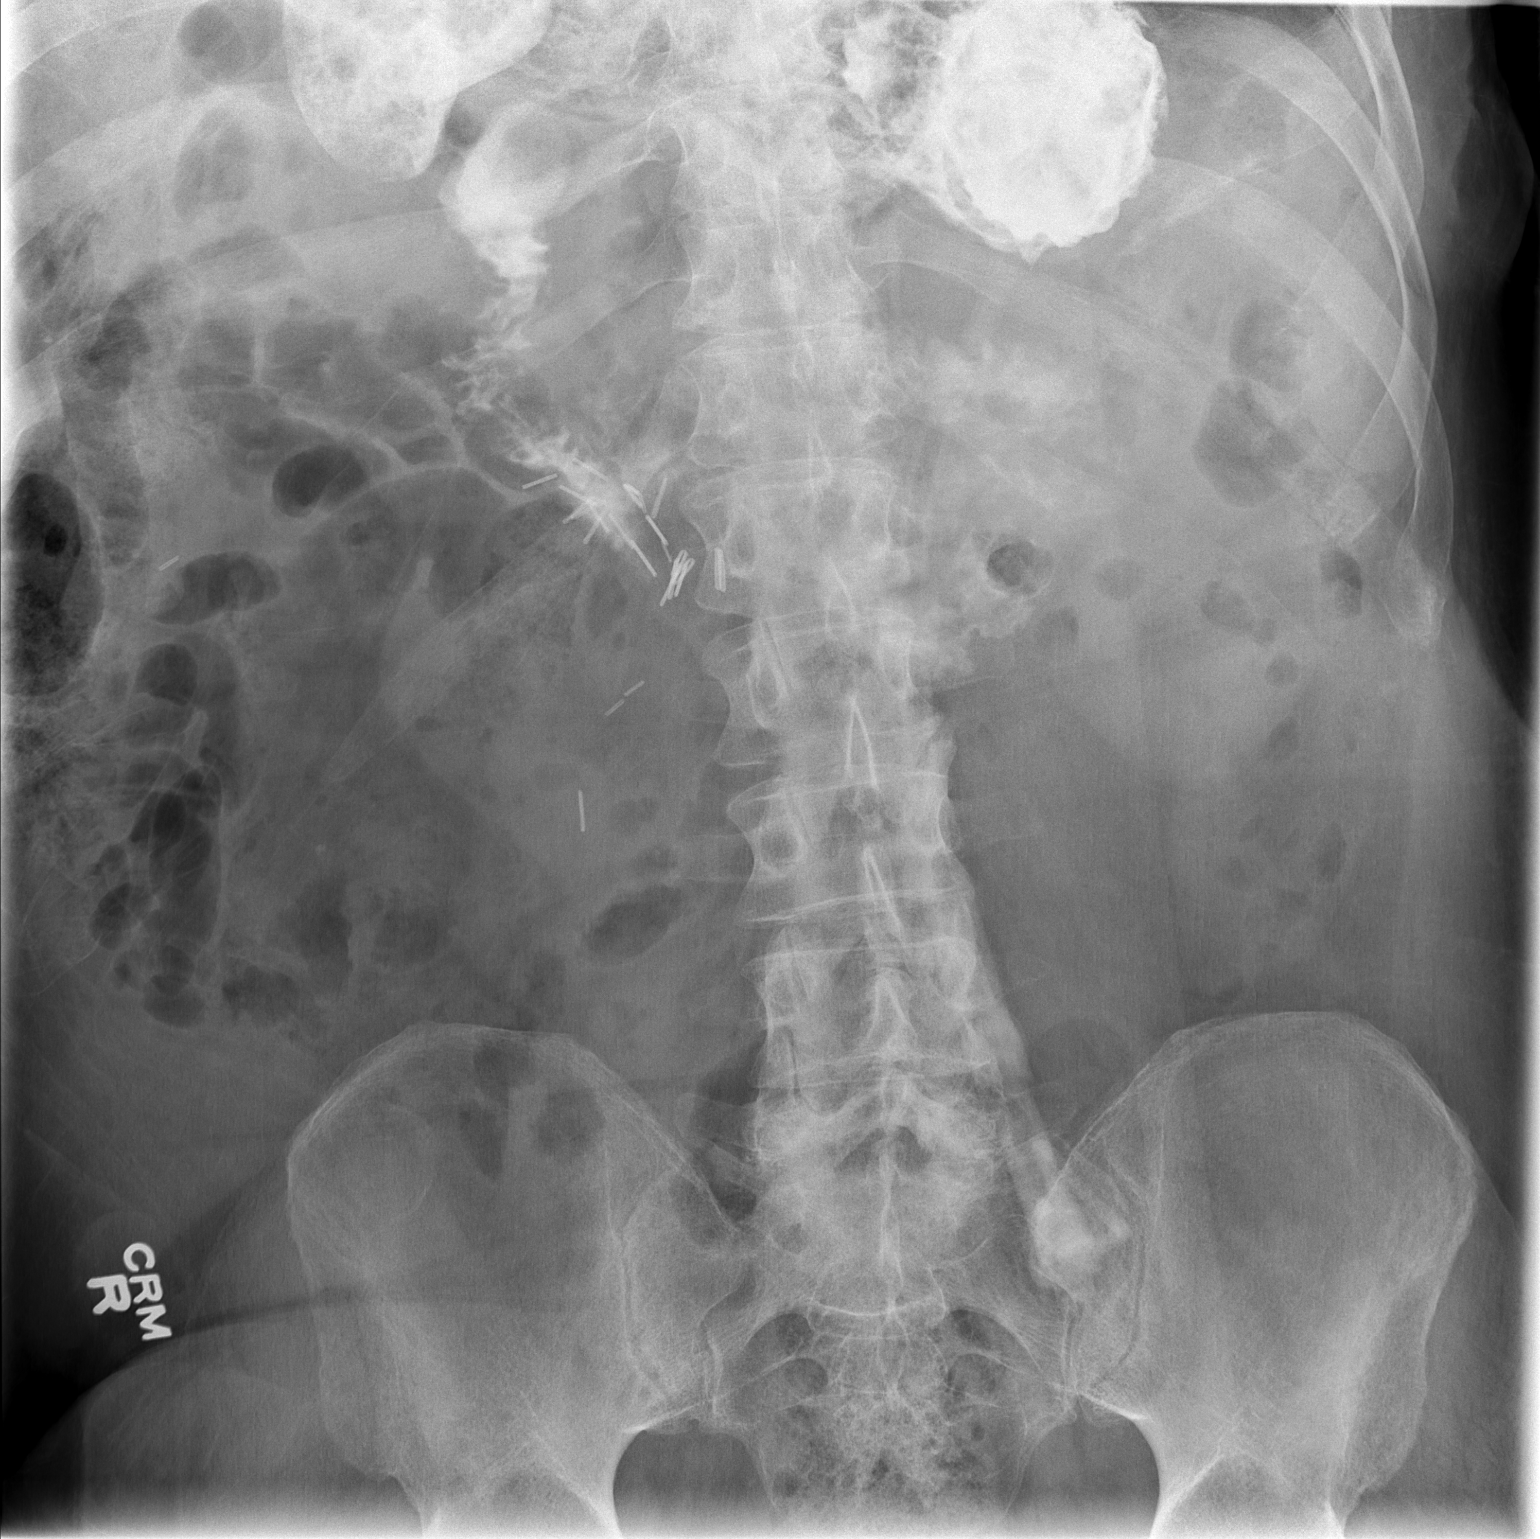

[2 of 2 positions shown; findings below may reference images not displayed]

FINDINGS: Contrast material has been injected through the patient's PEG tube
and fills the stomach and proximal duodenum. Contrast material
cephalad to the stomach on the right is likely within the colon.
IMPRESSION: PEG tube in good position.

## 2013-10-23 IMAGING — XA IR REPLACE G-TUBE/COLONIC TUBE
1 series · 6 of 6 positions shown · non-contrast
Comparison: 07/31/2013

:
CLINICAL DATA: Dislodgement of chronic indwelling gastrostomy tube.

GASTROSTOMY CATHETER REPLACEMENT
Fluoro time:  18 seconds

[Series 1: run · 6 of 6 slices shown]
[im 1/6]
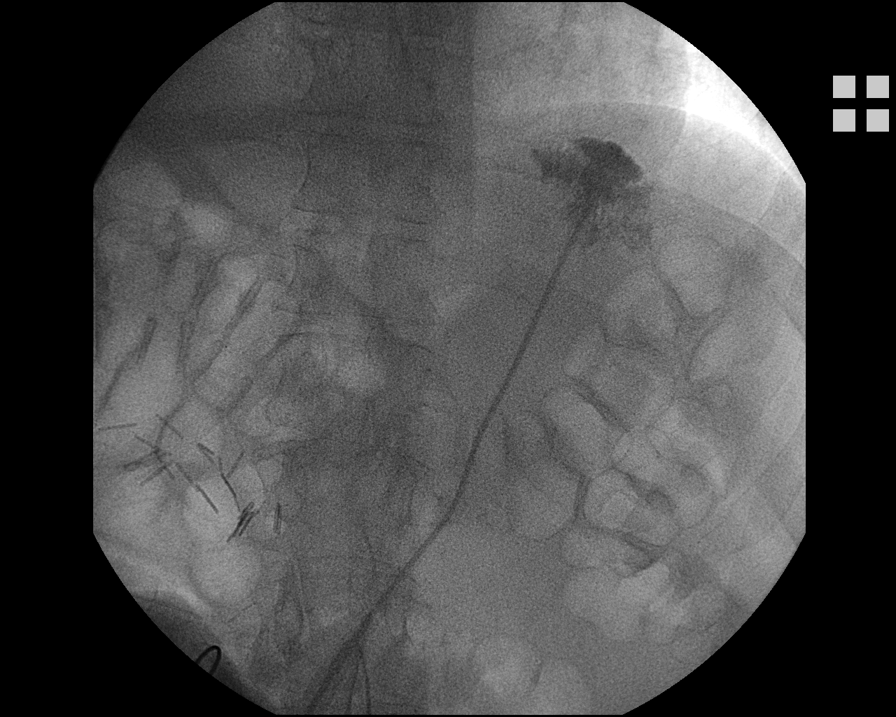
[im 2/6]
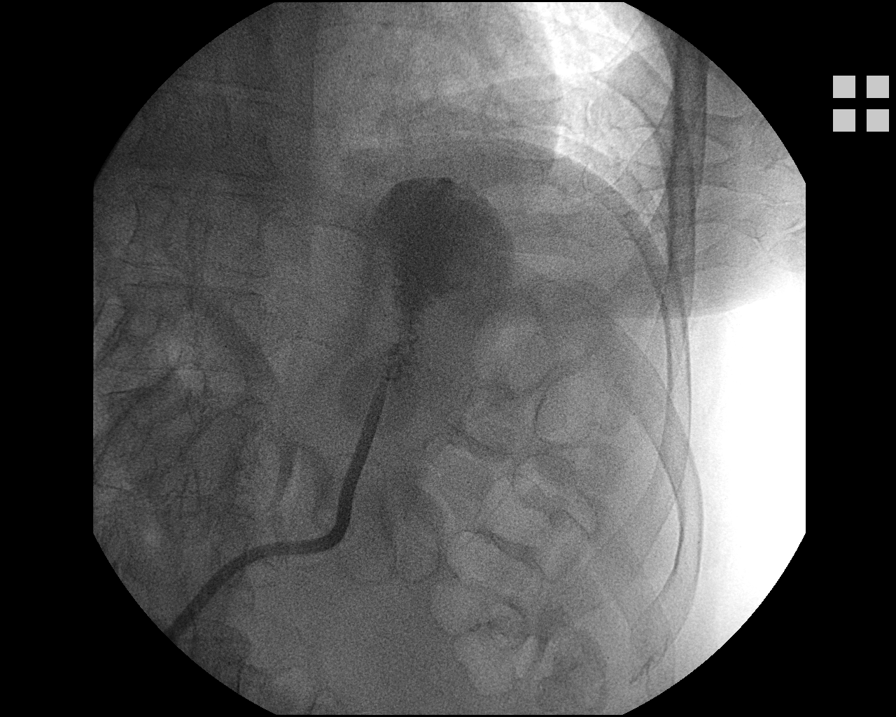
[im 3/6]
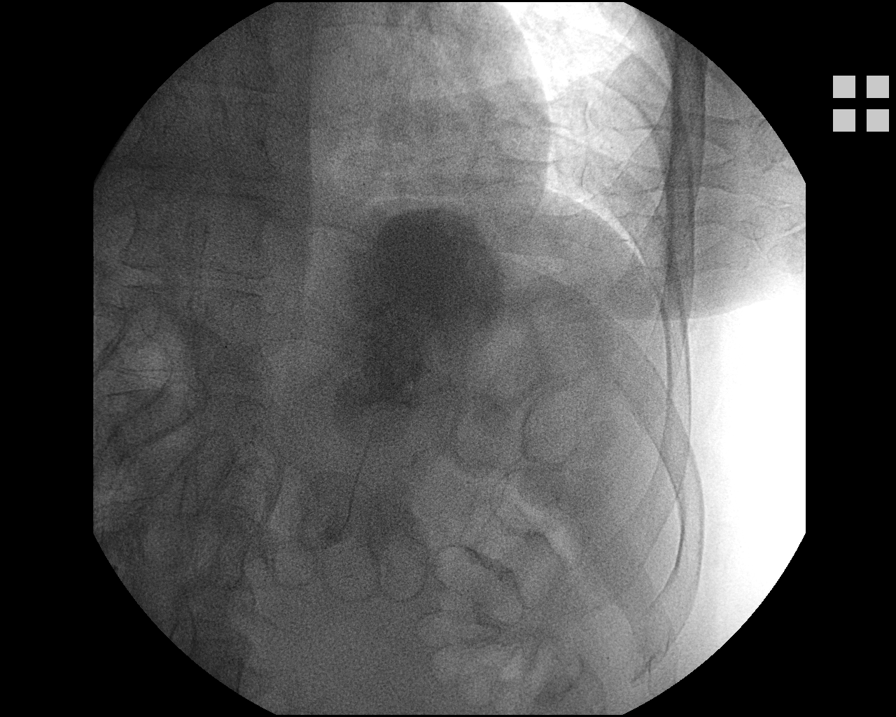
[im 4/6]
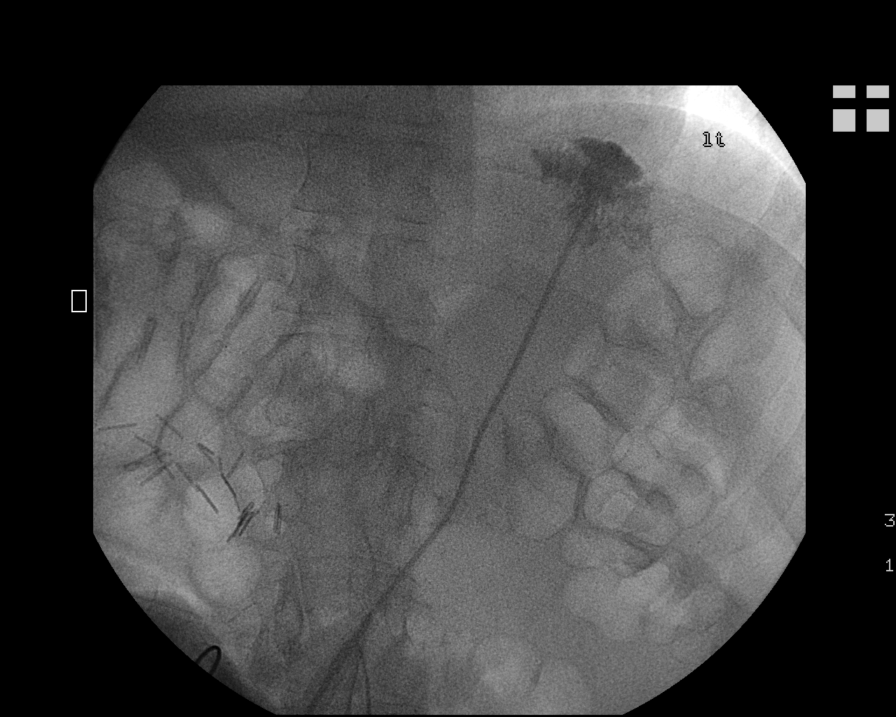
[im 5/6]
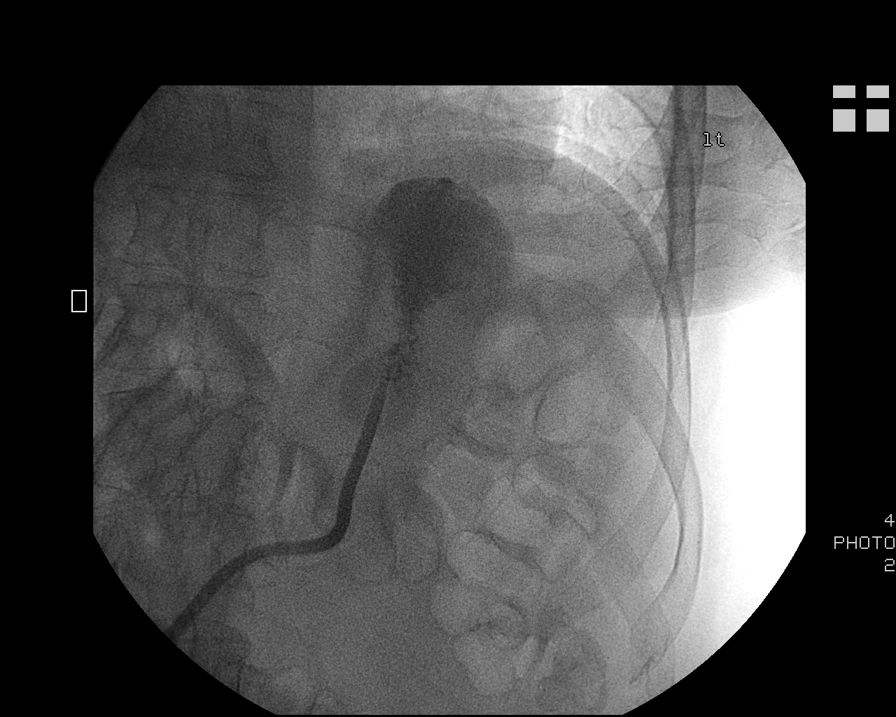
[im 6/6]
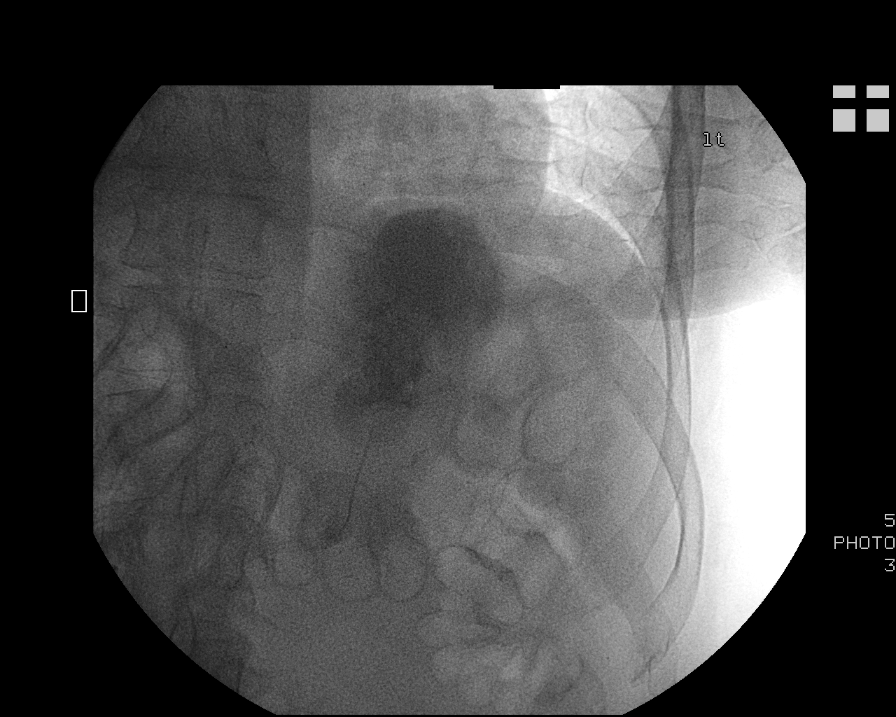

[6 of 6 positions shown; findings below may reference images not displayed]

FINDINGS: Indwelling Foley catheter was removed. A new 22-French
balloon retention gastrostomy tube was advanced through the
preexisting tract. The balloon was inflated with a 8 ml saline.
The tube was injected with contrast material under fluoroscopy
demonstrating tip positioning within the body of the stomach.
External bumper was applied. The patient tolerated the procedure
well. No immediate complication.
IMPRESSION: Replacement of gastrostomy tube with new 22-French balloon
retention catheter. The tip lies in the body of the stomach.

## 2013-11-21 ENCOUNTER — Non-Acute Institutional Stay (SKILLED_NURSING_FACILITY): Payer: Medicare Other | Admitting: Internal Medicine

## 2013-11-21 DIAGNOSIS — E781 Pure hyperglyceridemia: Secondary | ICD-10-CM

## 2013-11-21 DIAGNOSIS — D696 Thrombocytopenia, unspecified: Secondary | ICD-10-CM

## 2013-11-21 DIAGNOSIS — E039 Hypothyroidism, unspecified: Secondary | ICD-10-CM

## 2013-11-28 ENCOUNTER — Non-Acute Institutional Stay (SKILLED_NURSING_FACILITY): Payer: Medicare Other | Admitting: Internal Medicine

## 2013-11-28 DIAGNOSIS — G931 Anoxic brain damage, not elsewhere classified: Secondary | ICD-10-CM

## 2013-11-28 DIAGNOSIS — D638 Anemia in other chronic diseases classified elsewhere: Secondary | ICD-10-CM

## 2013-11-28 DIAGNOSIS — K5900 Constipation, unspecified: Secondary | ICD-10-CM

## 2013-11-28 DIAGNOSIS — E0590 Thyrotoxicosis, unspecified without thyrotoxic crisis or storm: Secondary | ICD-10-CM

## 2013-11-28 DIAGNOSIS — K5909 Other constipation: Secondary | ICD-10-CM

## 2013-12-01 ENCOUNTER — Encounter: Payer: Self-pay | Admitting: Internal Medicine

## 2013-12-01 NOTE — Progress Notes (Signed)
PROGRESS NOTE  DATE: 11-28-13  FACILITY: [facility]  LEVEL OF CARE: SNF  Routine Visit  CHIEF COMPLAINT:  Manage hypothyroidism and constipation  HISTORY OF PRESENT ILLNESS:  REASSESSMENT OF ONGOING PROBLEM(S):  HYPOTHYROIDISM: The hypothyroidism remains stable. No complications noted from the medications presently being used.  The staff deny constipation.  Last TSH 9.27 in 5/14, 6/14 TSH 5.04, in 9/14 TSH 5.45, in 11-14 TSH 9.64. Patient is a poor historian secondary to anoxic brain injury.  CONSTIPATION: The constipation remains stable. No complications from the medications presently being used. Staff deny ongoing constipation, abdominal pain, nausea or vomiting.  PAST MEDICAL HISTORY : Reviewed.  No changes.  CURRENT MEDICATIONS: Reviewed per [facility]  REVIEW OF SYSTEMS: Unobtainable secondary to anoxic brain injury  PHYSICAL EXAMINATION  VS:  T 97.4      P 66     RR 18     BP 120/60   POX %     WT (Lb) 229  GENERAL: no acute distress, obese body habitus NECK: supple, trachea midline, no neck masses, no thyroid tenderness, no thyromegaly RESPIRATORY: breathing is even & unlabored, BS CTAB CARDIAC: RRR, no murmur,no extra heart sounds, no edema GI: abdomen soft, normal BS, no masses, no tenderness, no hepatomegaly, no splenomegaly PSYCHIATRIC: the patient is lethargic, decreased affect & mood  LABS/RADIOLOGY:  11-14 triglycerides 163, HDL 22 otherwise FLP normal, hemoglobin A1c 5.2, platelets 133 otherwise CBC normal, CMP normal  8-14 Depakote level 11 5/14 fasting lipid panel normal, including A1c 4.9, CMP normal, platelets 139 otherwise CBC normal 2/14 Depakote 6 1/14 he will profile normal, platelets 121 otherwise CBC normal, BMP normal  ASSESSMENT/PLAN:  hypothyroidism-uncontrolled. levothyroxine was increased. Recheck TSH in 6 weeks. constipation-well-controlled. anoxic brain injury-continue supportive care.   anemia of chronic disease-continue  iron. Cardiomyopathy-well compensated. Depression-Effexor was decreased  CPT CODE: 54098

## 2013-12-05 NOTE — Progress Notes (Signed)
Patient ID: [name], male   DOB: 06/17/1966, 47 y.o.   MRN: [id number]     [facility]  Allergies  Allergen Reactions  . Ativan [Lorazepam]     Unknown  . Latex Dermatitis  . Morphine And Related Other (See Comments)    UNKNOWN  . Penicillins Other (See Comments)    UNKNOWN  . Pyridium [Phenazopyridine Hcl] Other (See Comments)    UNKNOWN  . Soy Allergy Other (See Comments)    UNKNOWN    Chief Complaint  Patient presents with  . Acute Visit    follow up fall     HPI He is being seen today status a fall in which he has a large bruise in his right outer thigh. The x-ray that was performed did not demonstrate any fracture present. Nursing has asked me see his leg to ensure that there is nothing else needed at this time. He is unable to participate in the hpi or ros.   Past Medical History  Diagnosis Date  . Addison disease   . Anoxic brain injury   . A-fib   . Seizure disorder   . Dysphagia   . History of recurrent UTIs   . Hyperlipemia   . Aspiration pneumonia   . Mental disorder   . Anxiety   . Reflux   . Dysphagia   . Glucocorticoid deficiency   . Atrial fibrillation   . Glaucoma   . Hypothyroidism   . Encephalopathy   . Quadriplegia   . Addison disease   . Myocardial infarction     Past Surgical History  Procedure Laterality Date  . Nephrectomy  unknown  . Peg tube placement      Filed Vitals:   07/17/13 1432  BP: 120/60  Pulse: 72  Height: 6\' 1"  (1.854 m)  Weight: 224 lb (101.606 kg)    MEDICATIONS  mvi daily Nectar thick liquids Synthroid 25 mcg daily Fish oil 1 gm daily protonix 40 mg daily miralax twice daily effexor 37.5 mg twice daily Lopressor 12.5 mg twice daily depakote 250 mg twice daily Iron tid Haldol 2 mg every 6 hors prn   SIGNIFICANT RESULTS  07-17-13: right hip x-ray; mild degenerative disease and osteoporosis of right hip without definite fracture or dislocation.   LABS REVIEWED:  05-05-13:wbc 5.9; hgb 13.8;  hct 40.5; mcv 88; plt 139; glucose 66; bun 14; creat 0.77; k+4.2; na++ 139; liver normal albumin 3.8  chol 129; ldl 82; trig 129; hgb a1c 4.9; tsh 9.270;  06-20-13: tsh 5.040   Review of Systems  Unable to perform ROS   Physical Exam  Constitutional: He appears well-developed and well-nourished. No distress.  Neck: Neck supple. No JVD present. No thyromegaly present.  Cardiovascular: Normal rate, regular rhythm and intact distal pulses.   Respiratory: Effort normal and breath sounds normal. No respiratory distress. He has no wheezes.  GI: Soft. Bowel sounds are normal. He exhibits no distension. There is no tenderness.  Peg tube present   Musculoskeletal: He exhibits no edema.  Neurological:  Not aware  Skin: Skin is warm and dry. He is not diaphoretic.  Has a large bruise on the right outer thigh      ASSESSMENT/PLAN  1. Anemia: will lower the iron to one time daily(hgb normal)  2. Status post fall with bruise: will continue to monitor his status will not make change and will continue current plan of care.

## 2014-01-02 ENCOUNTER — Non-Acute Institutional Stay (SKILLED_NURSING_FACILITY): Payer: Medicare Other | Admitting: Internal Medicine

## 2014-01-02 DIAGNOSIS — G931 Anoxic brain damage, not elsewhere classified: Secondary | ICD-10-CM

## 2014-01-02 DIAGNOSIS — K5900 Constipation, unspecified: Secondary | ICD-10-CM

## 2014-01-02 DIAGNOSIS — K5909 Other constipation: Secondary | ICD-10-CM

## 2014-01-02 DIAGNOSIS — D638 Anemia in other chronic diseases classified elsewhere: Secondary | ICD-10-CM

## 2014-01-02 DIAGNOSIS — E0590 Thyrotoxicosis, unspecified without thyrotoxic crisis or storm: Secondary | ICD-10-CM

## 2014-01-02 NOTE — Progress Notes (Signed)
PROGRESS NOTE  DATE: 01-02-14  FACILITY: [facility]  LEVEL OF CARE: SNF  Routine Visit  CHIEF COMPLAINT:  Manage hypothyroidism and constipation  HISTORY OF PRESENT ILLNESS:  REASSESSMENT OF ONGOING PROBLEM(S):  HYPOTHYROIDISM: The hypothyroidism remains stable. No complications noted from the medications presently being used.  The staff deny constipation.  Last TSH 9.27 in 5/14, 6/14 TSH 5.04, in 9/14 TSH 5.45, in 11-14 TSH 9.64. Patient is a poor historian secondary to anoxic brain injury.  CONSTIPATION: The constipation remains stable. No complications from the medications presently being used. Staff deny ongoing constipation, abdominal pain, nausea or vomiting.  PAST MEDICAL HISTORY : Reviewed.  No changes.  [facility]  REVIEW OF SYSTEMS: Unobtainable secondary to anoxic brain injury  PHYSICAL EXAMINATION  VS:  T 96.4      P 64     RR 20     BP 100/58      WT (Lb) 239  GENERAL: no acute distress, obese body habitus NECK: supple, trachea midline, no neck masses, no thyroid tenderness, no thyromegaly RESPIRATORY: breathing is even & unlabored, BS CTAB CARDIAC: RRR, no murmur,no extra heart sounds, no edema GI: abdomen soft, normal BS, no masses, no tenderness, no hepatomegaly, no splenomegaly PSYCHIATRIC: the patient is lethargic, decreased affect & mood  LABS/RADIOLOGY:  11-14 triglycerides 163, HDL 22 otherwise FLP normal, hemoglobin A1c 5.2, platelets 133 otherwise CBC normal, CMP normal  8-14 Depakote level 11 5/14 fasting lipid panel normal, including A1c 4.9, CMP normal, platelets 139 otherwise CBC normal 2/14 Depakote 6 1/14 he will profile normal, platelets 121 otherwise CBC normal, BMP normal  ASSESSMENT/PLAN:  hypothyroidism-uncontrolled. levothyroxine was increased. Recheck TSH. constipation-well-controlled. anoxic brain injury-continue supportive care.   anemia of chronic disease-continue iron. Cardiomyopathy-well  compensated. Depression-Effexor was decreased  CPT CODE: [id number]

## 2014-01-04 ENCOUNTER — Encounter: Payer: Self-pay | Admitting: Internal Medicine

## 2014-01-04 DIAGNOSIS — E781 Pure hyperglyceridemia: Secondary | ICD-10-CM | POA: Insufficient documentation

## 2014-01-04 NOTE — Progress Notes (Signed)
Patient ID: [name], male   DOB: Jan 05, 1966, 48 y.o.   MRN: [id number]        PROGRESS NOTE  DATE: 11/21/2013    [facility]  LEVEL OF CARE: SNF (31)  Acute Visit  CHIEF COMPLAINT:  Manage hypothyroidism.    HISTORY OF PRESENT ILLNESS: I was requested by the staff to assess the patient regarding above problem(s):  HYPOTHYROIDISM: The hypothyroidism is unstable. No complications noted from the medications presently being used.  The staff denies fatigue or constipation.  Last TSH:  On 11/16/2013:  TSH 9.64.  Patient is a poor historian due to anoxic brain injury.     PAST MEDICAL HISTORY : Reviewed.  No changes.  [facility]: [facility]  REVIEW OF SYSTEMS:  Unobtainable due to anoxic brain injury.    PHYSICAL EXAMINATION  GENERAL: no acute distress, moderately obese body habitus NECK: supple, trachea midline, no neck masses, no thyroid tenderness, no thyromegaly RESPIRATORY: breathing is even & unlabored, BS CTAB CARDIAC: RRR, no murmur,no extra heart sounds, no edema GI: abdomen soft, normal BS, no masses, no tenderness, no hepatomegaly, no splenomegaly PSYCHIATRIC: the patient is alert & oriented to person, affect & behavior appropriate  LABS/RADIOLOGY: On 11/16/2013:  Platelets 133.    Triglycerides 163, HDL 22, total cholesterol 409118, LDL 63.    In 04/2013:  Platelets 139.    ASSESSMENT/PLAN:  Hypothyroidism.  Unstable problem.  Increase levothyroxine to 50 mcg q.d.  Check TSH in six weeks.    Thrombocytopenia.  Stable.    Hypertriglyceridemia.   Stable.    THN Metrics:   Not on aspirin.  No tobacco use.  BP:  120/60.    CPT CODE: [id number]

## 2014-01-09 ENCOUNTER — Non-Acute Institutional Stay (SKILLED_NURSING_FACILITY): Payer: Medicare Other | Admitting: Internal Medicine

## 2014-01-09 DIAGNOSIS — E039 Hypothyroidism, unspecified: Secondary | ICD-10-CM

## 2014-01-09 NOTE — Progress Notes (Signed)
         PROGRESS NOTE  DATE: 01/09/2014  [facility]  LEVEL OF CARE: SNF (31)  Acute Visit  CHIEF COMPLAINT:  Manage hypothyroidism  HISTORY OF PRESENT ILLNESS: I was requested by the staff to assess the patient regarding above problem(s):  HYPOTHYROIDISM: The hypothyroidism remains stable. No complications noted from the medications presently being used.  The staff deny constipation.  Last TSH is 11.01 on 01-08-14. Patient does not follow commands due to anoxic brain injury.  PAST MEDICAL HISTORY : Reviewed.  No changes.  [facility]  REVIEW OF SYSTEMS: Unobtainable due to anoxic brain injury  PHYSICAL EXAMINATION  GENERAL: no acute distress, normal body habitus NECK: supple, trachea midline, no neck masses, no thyroid tenderness, no thyromegaly RESPIRATORY: breathing is even & unlabored, BS CTAB CARDIAC: RRR, no murmur,no extra heart sounds, no edema GI: abdomen soft, normal BS, no masses, no tenderness, no hepatomegaly, no splenomegaly  LABS/RADIOLOGY: See history of present illness  ASSESSMENT/PLAN:  Hypothyroidism-uncontrolled problem. Increase levothyroxine to 75 mcg daily. Check TSH in 6 weeks.  CPT CODE: [id number]

## 2014-01-29 ENCOUNTER — Encounter: Payer: Self-pay | Admitting: *Deleted

## 2014-01-30 ENCOUNTER — Non-Acute Institutional Stay (SKILLED_NURSING_FACILITY): Payer: Medicare Other | Admitting: Internal Medicine

## 2014-01-30 DIAGNOSIS — D638 Anemia in other chronic diseases classified elsewhere: Secondary | ICD-10-CM

## 2014-01-30 DIAGNOSIS — G931 Anoxic brain damage, not elsewhere classified: Secondary | ICD-10-CM

## 2014-01-30 DIAGNOSIS — E039 Hypothyroidism, unspecified: Secondary | ICD-10-CM

## 2014-01-30 DIAGNOSIS — K5909 Other constipation: Secondary | ICD-10-CM

## 2014-01-30 DIAGNOSIS — K5900 Constipation, unspecified: Secondary | ICD-10-CM

## 2014-02-08 NOTE — Progress Notes (Signed)
          PROGRESS NOTE  DATE: 01-30-14  FACILITY: [facility]  LEVEL OF CARE: SNF  Routine Visit  CHIEF COMPLAINT:  Manage hypothyroidism and constipation  HISTORY OF PRESENT ILLNESS:  REASSESSMENT OF ONGOING PROBLEM(S):  HYPOTHYROIDISM: The hypothyroidism remains stable. No complications noted from the medications presently being used.  The staff deny constipation.  Last TSH 9.27 in 5/14, 6/14 TSH 5.04, in 9/14 TSH 5.45, in 11-14 TSH 9.64, in 1-15 TSH 11.1. Patient is a poor historian secondary to anoxic brain injury.  CONSTIPATION: The constipation remains stable. No complications from the medications presently being used. Staff deny ongoing constipation, abdominal pain, nausea or vomiting.  PAST MEDICAL HISTORY : Reviewed.  No changes.  [facility]: [facility]  REVIEW OF SYSTEMS: Unobtainable secondary to anoxic brain injury  PHYSICAL EXAMINATION  VS:  T 98      P 82     RR 18     BP 110/60      WT (Lb) 239  GENERAL: no acute distress, obese body habitus NECK: supple, trachea midline, no neck masses, no thyroid tenderness, no thyromegaly RESPIRATORY: breathing is even & unlabored, BS CTAB CARDIAC: RRR, no murmur,no extra heart sounds, no edema GI: abdomen soft, normal BS, no masses, no tenderness, no hepatomegaly, no splenomegaly PSYCHIATRIC: the patient is lethargic, decreased affect & mood  LABS/RADIOLOGY:  11-14 triglycerides 163, HDL 22 otherwise FLP normal, hemoglobin A1c 5.2, platelets 133 otherwise CBC normal, CMP normal  8-14 Depakote level 11 5/14 fasting lipid panel normal, including A1c 4.9, CMP normal, platelets 139 otherwise CBC normal 2/14 Depakote 6 1/14 he will profile normal, platelets 121 otherwise CBC normal, BMP normal  ASSESSMENT/PLAN:  hypothyroidism-uncontrolled. levothyroxine was increased. Recheck TSH is pending constipation-well-controlled. anoxic brain injury-continue supportive care.   anemia of chronic disease-continue  iron. Cardiomyopathy-well compensated. Depression-on Effexor Check Depakote level  CPT CODE: [id number]

## 2014-03-02 ENCOUNTER — Non-Acute Institutional Stay (SKILLED_NURSING_FACILITY): Payer: Medicare Other | Admitting: Internal Medicine

## 2014-03-02 DIAGNOSIS — K9420 Gastrostomy complication, unspecified: Secondary | ICD-10-CM

## 2014-03-02 DIAGNOSIS — I255 Ischemic cardiomyopathy: Secondary | ICD-10-CM

## 2014-03-02 DIAGNOSIS — E0590 Thyrotoxicosis, unspecified without thyrotoxic crisis or storm: Secondary | ICD-10-CM

## 2014-03-02 DIAGNOSIS — I2589 Other forms of chronic ischemic heart disease: Secondary | ICD-10-CM

## 2014-03-02 DIAGNOSIS — K9429 Other complications of gastrostomy: Secondary | ICD-10-CM

## 2014-03-05 NOTE — Progress Notes (Addendum)
Patient ID: [name], male   DOB: February 24, 1966, 48 y.o.   MRN: [id number]                  PROGRESS NOTE  DATE:  03/02/2014    [facility]    LEVEL OF CARE:   SNF   Acute Visit   CHIEF COMPLAINT:  Review of PEG site.    HISTORY OF PRESENT ILLNESS:  Mr. Steven Standingewman is a longstanding resident of this facility with anoxic brain injury at the time of a cardiac arrest.    He has a PEG tube in place which currently is being used only for medications.  He appears to eat an oral diet other than this.    PAST MEDICAL HISTORY:  He has an extensive past medical history other than this which includes:    Anoxic brain injury.    Atrial fibrillation.    Seizure disorder.    History of recurrent UTIs.    Hyperlipidemia.    Aspiration pneumonia.    Depression with anxiety.    Hypothyroidism.  On replacement, although as I remember things, he was hyperthyroid at one point.    History of a myocardial infarction with cardiac arrest resulting in anoxic brain injury.    Type 2 diabetes.    CURRENT MEDICATIONS:  Medication list is reviewed.     PHYSICAL EXAMINATION:   GENERAL APPEARANCE:  The patient does not look to be in any distress.   CHEST/RESPIRATORY:  Clear air entry bilaterally.   CARDIOVASCULAR:  CARDIAC:   Heart sounds are normal.  There are no murmurs.  No gallops.  No signs of heart failure.   GASTROINTESTINAL:  ABDOMEN:   Soft.  No masses.  Surrounding his PEG site are copious amounts of hypergranulation.  I took this as the source of his PEG tube problems.  I applied silver nitrate in large amounts to this area.  There did not seem to be any evidence of ongoing infection.   LIVER/SPLEEN/KIDNEYS:  No liver, no spleen.    ASSESSMENT/PLAN:  PEG tube complications.  I am not really certain of the source of the hypergranulation, although I did add silver nitrate.  I have advised silver alginate under the Kerlix gauze around the tube.    Cardiomyopathy.  I do  not see anything in his current chart about this.  He is on a beta blocker, but not an ACE inhibitor.    Hypothyroidism.  On replacement.  His last TSH was 5.49 at the end of February.   Currently, he is taking 75 mcg of Synthroid daily.  At one point he was on methimazole, I believe.  I do not see that he actually received definitive treatment for his hyperthyroidism.  The exact mechanism of his hypothyroidism is, therefore, not completely clear.    Gastroesophageal reflux.  On Protonix.    The patient has had recent lab work on 02/02/2014.  I note a normal CBC except a slightly low platelet count.  His comprehensive metabolic panel was normal.

## 2014-04-17 ENCOUNTER — Encounter: Payer: Self-pay | Admitting: Adult Health

## 2014-04-17 ENCOUNTER — Non-Acute Institutional Stay (SKILLED_NURSING_FACILITY): Payer: Medicare Other | Admitting: Adult Health

## 2014-04-17 DIAGNOSIS — R1310 Dysphagia, unspecified: Secondary | ICD-10-CM

## 2014-04-17 DIAGNOSIS — K5900 Constipation, unspecified: Secondary | ICD-10-CM

## 2014-04-17 DIAGNOSIS — F329 Major depressive disorder, single episode, unspecified: Secondary | ICD-10-CM

## 2014-04-17 DIAGNOSIS — F32A Depression, unspecified: Secondary | ICD-10-CM

## 2014-04-17 DIAGNOSIS — Z8679 Personal history of other diseases of the circulatory system: Secondary | ICD-10-CM

## 2014-04-17 DIAGNOSIS — F3289 Other specified depressive episodes: Secondary | ICD-10-CM

## 2014-04-17 DIAGNOSIS — G931 Anoxic brain damage, not elsewhere classified: Secondary | ICD-10-CM

## 2014-04-17 DIAGNOSIS — E039 Hypothyroidism, unspecified: Secondary | ICD-10-CM

## 2014-04-17 DIAGNOSIS — D638 Anemia in other chronic diseases classified elsewhere: Secondary | ICD-10-CM

## 2014-04-17 NOTE — Progress Notes (Signed)
Patient ID: [name], male   DOB: 1966/01/09, 48 y.o.   MRN: [id number]     [facility]  Allergies  Allergen Reactions  . Ativan [Lorazepam]     Unknown  . Latex Dermatitis  . Morphine And Related Other (See Comments)    UNKNOWN  . Penicillins Other (See Comments)    UNKNOWN  . Pyridium [Phenazopyridine Hcl] Other (See Comments)    UNKNOWN  . Soy Allergy Other (See Comments)    UNKNOWN     Chief Complaint  Patient presents with  . Medical Management of Chronic Issues    HPI:  He is being seen for the management of his chronic illnesses. His family is concerned that he is developing an uti; he has been found walking down the hallway; and he has fallen once without injury. He is unable to participate in the hpi or ros.    Past Medical History  Diagnosis Date  . Addison disease   . Anoxic brain injury   . A-fib   . Seizure disorder   . Dysphagia   . History of recurrent UTIs   . Hyperlipemia   . Aspiration pneumonia   . Mental disorder   . Anxiety   . Reflux   . Dysphagia   . Glucocorticoid deficiency   . Atrial fibrillation   . Glaucoma   . Hypothyroidism   . Encephalopathy   . Quadriplegia   . Addison disease   . Myocardial infarction   . Pneumoperitoneum of unknown etiology 12/21/2011    Past Surgical History  Procedure Laterality Date  . Nephrectomy  unknown  . Peg tube placement      VITAL SIGNS BP 126/74  Pulse 96  Ht 6\' 1"  (1.854 m)  Wt 244 lb (110.678 kg)  BMI 32.20 kg/m2   Patient's Medications  New Prescriptions   No medications on file  Previous Medications   ARIPIPRAZOLE (ABILIFY) 5 MG TABLET    Take 10 mg by mouth daily.    DIPHENHYDRAMINE (BENADRYL) 25 MG CAPSULE    25 mg by PEG Tube route every 6 (six) hours as needed. For allergic reaction   DIVALPROEX (DEPAKOTE SPRINKLE) 125 MG CAPSULE    Take 375 mg by mouth 2 (two) times daily. Take 3 capsules = 375 mg by mouth twice daily to stabilize mood.   FEEDING SUPPLEMENT  (PRO-STAT SUGAR FREE 64) LIQD    30 mLs by PEG Tube route 2 (two) times daily.    FERROUS SULFATE 220 (44 FE) MG/5ML SOLUTION    330 mg by PEG Tube route daily.    FISH OIL-OMEGA-3 FATTY ACIDS 1000 MG CAPSULE    1 g by PEG Tube route 2 (two) times daily. Via peg tube   IPRATROPIUM-ALBUTEROL (DUONEB) 0.5-2.5 (3) MG/3ML SOLN    Take 3 mLs by nebulization every 6 (six) hours as needed. For shortness of breath   LEVOTHYROXINE (SYNTHROID, LEVOTHROID) 25 MCG TABLET    Take 75 mcg by mouth daily before breakfast.    METHYLCELLULOSE (ARTIFICIAL TEARS) 1 % OPHTHALMIC SOLUTION    Place 1 drop into both eyes 4 (four) times daily.     METOPROLOL TARTRATE (LOPRESSOR) 25 MG/10 ML SUSP    12.5 mg by PEG Tube route 2 (two) times daily.    MULTIVITAMIN/MINERALS (GOLDEN AGE) LIQD    5 mLs by PEG Tube route daily.   PANTOPRAZOLE SODIUM (PROTONIX) 40 MG/20 ML PACK    40 mg by PEG Tube route daily at 12 noon.  POLYETHYLENE GLYCOL (MIRALAX / GLYCOLAX) PACKET    17 g by PEG Tube route 2 (two) times daily. Mix 17gm 4-8 ounces of liquid   TRAZODONE (DESYREL) 50 MG TABLET    Take 50 mg by mouth 3 (three) times daily as needed (ANXIETY).   Modified Medications   No medications on file  Discontinued Medications   VENLAFAXINE (EFFEXOR) 37.5 MG TABLET    37.5 mg by PEG Tube route 2 (two) times daily.    SIGNIFICANT DIAGNOSTIC EXAMS    LABS REVIEWED;   02-01-14: depakote 24 02-02-14: wbc 7.6; hgb 16.0; hcvt 45.9; mcv 86 plt 143; glucose 71; bun 28; creat 1.01; k+3.8; na++142 liver normal albumin 3.6; amylase 38; lipase 22 02-17-14: tsh 5.490     Review of Systems  Unable to perform ROS: patient nonverbal     Physical Exam  Constitutional: No distress.  Obese   Neck: No JVD present.  Cardiovascular: Normal rate, regular rhythm and intact distal pulses.   Respiratory: Effort normal and breath sounds normal. No respiratory distress. He has no wheezes.  GI: Soft. Bowel sounds are normal. He exhibits no distension.  There is no tenderness.  Peg tube   Musculoskeletal: He exhibits no edema.  Is able to move extremities   Neurological:  Is aware to self   Skin: Skin is warm and dry. He is not diaphoretic.      ASSESSMENT/ PLAN:  1. Hypothyroidism: will continue synthroid 75 mcg daily and will monitor  2. Gerd: will continue protonix 40 mg daily   3. Anemia: will continue iron daily   4. Dysphagia: no signs of aspiration present; is on thin liquids; takes medications via peg.   5. Afib: heart rate is regular is taking lopressor 12.5 mg twice daily for rate control will monitor  6. Anoxic brain injury: no change in status; he does have behavorial issues present; is followed by [facility]; he was taken off effexor; will continue abilify 10 mg daily; depakote sprinkles 375 mg twice daily; and has trazodone 50 mg three times daily as needed for anxiety symptoms.   7. Constipation: will continue miralax twice daily   Will get a ua/c&s and will monitor his status          Synthia Innocenteborah Green NP [facility]  Contact [phone] Monday through Friday 8am- 5pm  After hours call [phone]

## 2014-04-19 ENCOUNTER — Encounter: Payer: Self-pay | Admitting: Internal Medicine

## 2014-04-19 ENCOUNTER — Non-Acute Institutional Stay (SKILLED_NURSING_FACILITY): Payer: Medicare Other | Admitting: Internal Medicine

## 2014-04-19 DIAGNOSIS — IMO0002 Reserved for concepts with insufficient information to code with codable children: Secondary | ICD-10-CM | POA: Insufficient documentation

## 2014-04-19 DIAGNOSIS — F29 Unspecified psychosis not due to a substance or known physiological condition: Secondary | ICD-10-CM

## 2014-04-19 DIAGNOSIS — F23 Brief psychotic disorder: Secondary | ICD-10-CM

## 2014-04-19 NOTE — Progress Notes (Signed)
Patient ID: [name], male   DOB: 05/16/1966, 48 y.o.   MRN: [id number]    [facility] Chief Complaint  Patient presents with  . Acute Visit    behavior changes   Allergies  Allergen Reactions  . Ativan [Lorazepam]     Unknown  . Latex Dermatitis  . Morphine And Related Other (See Comments)    UNKNOWN  . Penicillins Other (See Comments)    UNKNOWN  . Pyridium [Phenazopyridine Hcl] Other (See Comments)    UNKNOWN  . Soy Allergy Other (See Comments)    UNKNOWN   HPI 48 y/o male patient is seen today fr acute behavioral changes. He has been combative with staff, refusing his medications for past couple of days. There was concern for uti and u/a with culture sent. U/a is normal. Remains afebrile. He has a peg tube and denies any abdominal discomfort or pain elsewhere. He has also had some confusion and tries to get out of his chair increasing risks for fall. No falls reported.  ROS No fever or chills reported Denies chest pain or trouble breathing Able to tell me where he is, what month this is and name the [place] Denies dysuria Has constipation but no nausea or vomiting History taking and ROS limited with his dysarthria  Past Medical History  Diagnosis Date  . Addison disease   . Anoxic brain injury   . A-fib   . Seizure disorder   . Dysphagia   . History of recurrent UTIs   . Hyperlipemia   . Aspiration pneumonia   . Mental disorder   . Anxiety   . Reflux   . Dysphagia   . Glucocorticoid deficiency   . Atrial fibrillation   . Glaucoma   . Hypothyroidism   . Encephalopathy   . Quadriplegia   . Addison disease   . Myocardial infarction   . Pneumoperitoneum of unknown etiology 12/21/2011  . Chronic constipation 11/23/2011   Past Surgical History  Procedure Laterality Date  . Nephrectomy  unknown  . Peg tube placement     Medication reviewed. [facility]  Physical exam BP 128/80  Pulse 82  Temp(Src) 98.2 F (36.8 C)  Resp  18  Constitutional: No distress.Obese   Neck: No JVD present.  Cardiovascular: Normal rate, regular rhythm and intact distal pulses.   Respiratory: Effort normal and breath sounds normal. No respiratory distress. He has no wheezes.  GI: Soft. Bowel sounds are normal. He exhibits no distension. There is no tenderness. Peg tube   Musculoskeletal: He exhibits no edema. Is able to move extremities   Neurological: alert and oriented to person, place and time Skin: Skin is warm and dry. He is not diaphoretic.   Assessment/plan  Acute behavioral disturbance/ psychosis i think this is likely in setting of his psychosis along with his history of anoxic brain injury. i see that his abilify was increased a month back without much desired effect. Will taper this to 5 mg daily for a week and discontinue. Will start him instead on risperidone 1 mg daily at bedtime and continue depakote sprinkles with prn trazodone and reassess. His urine appears clean on u/a. Follow on culture. Can get cbc with diff to rule out infection. Check renal and liver function to assess for acute abnormality causing the acute behavior change. Also to assess tsh for euthyroid check  Labs- cbc with diff, cmp, tsh

## 2014-04-24 ENCOUNTER — Non-Acute Institutional Stay (SKILLED_NURSING_FACILITY): Payer: Medicare Other | Admitting: Internal Medicine

## 2014-04-24 DIAGNOSIS — G931 Anoxic brain damage, not elsewhere classified: Secondary | ICD-10-CM

## 2014-04-24 DIAGNOSIS — E039 Hypothyroidism, unspecified: Secondary | ICD-10-CM

## 2014-04-25 ENCOUNTER — Non-Acute Institutional Stay (SKILLED_NURSING_FACILITY): Payer: Medicare Other | Admitting: Internal Medicine

## 2014-04-25 DIAGNOSIS — E039 Hypothyroidism, unspecified: Secondary | ICD-10-CM

## 2014-04-26 NOTE — Progress Notes (Addendum)
Patient ID: [name], male   DOB: [birth year], 48 y.o.   MRN: [id number]                  PROGRESS NOTE  DATE:  04/24/2014    [facility]    LEVEL OF CARE:   SNF   Acute Visit   HISTORY OF PRESENT ILLNESS:  [name] is a longstanding resident of this facility, having initially presented with anoxic brain injury at the time of a cardiac arrest.  He has a PEG tube in place, although we have only recently been using this for medications.    He was at one point hyperthyroid on his presentation here and I believe was on methimazole.  He has managed to transition his way into a hypothyroid state and is now on replacement.  I have trouble really explaining this, although his TSH has been rechecked at 12.18 on Synthroid 75 mcg.    Staff have noted him to become increasingly restless.  He is often verbally aggressive and he has fallen, including falling out of the bed.  He was seen by our service last week.    LABORATORY DATA:   He had a normal comprehensive metabolic panel.     He had a CBC with diff that was normal other than a marginally low platelet count at 134.    His TSH was 12.18.     serum Dilantin level was done, although he is not on Dilantin.     A urinalysis was done that was negative.    Urine culture:  No growth.    CURRENT MEDICATIONS:  Medication list is reviewed.     He was started on Risperdal 1 mg last week, although he is already on Abilify at 10 mg.    Ferrous sulfate 7.5 mL equaling 300 mg daily.    Effexor 37.5 once daily.    Protonix 1 pack via PEG daily.    MiraLAX b.i.d.    Pro-Stat daily.    Metoprolol 12.5 b.i.d.    Depakote Sprinkles 125, three capsules, 375 b.i.d.    Synthroid 75 q.d.    Trazodone 50 mg p.o. three times a day p.r.n.    He gets a free water flush 240 every six hours.    REVIEW OF SYSTEMS:  Not really meaningful, although the patient seems to vaguely gesture towards his abdomen as if he might be  uncomfortable.    PHYSICAL EXAMINATION:   VITAL SIGNS:   O2 SATURATIONS:  95% on room air.   PULSE:   88.   RESPIRATIONS:  18 and unlabored.   CHEST/RESPIRATORY:  Clear air entry bilaterally.     CARDIOVASCULAR:  CARDIAC:  Heart sounds are normal.  No S4.  He appears to be euvolemic.   GASTROINTESTINAL:  ABDOMEN:   Surgical scars.  Some tenderness to deep palpation in the left upper quadrant, although this was nor reproducible.  I palpate no masses.  GENITOURINARY:  BLADDER:   No suprapubic fullness or tenderness.  There is no CVA tenderness.   NEUROLOGICAL:   I see very little new here.   PSYCHIATRIC:   MENTAL STATUS:   Actually calm and cooperative with me.       ASSESSMENT/PLAN:    Hypothyroidism.  On replacement.   The only way I can easily explain this transition is if he got I-31 before he came here.  I will increase his Synthroid to 100 mcg and order appropriate TSH follow-up.  Cognitive issues related to cardiac arrest and anoxic brain injury.  He actually looks quite stable to me.  I do not think he needs an additional anti-psychotic to the Abilify he is already on I believe to augment his antidepressant therapy.  A valproic acid level would seem in order and we will see if that needs to be adjusted.  We will follow up on his behavior.

## 2014-04-27 ENCOUNTER — Non-Acute Institutional Stay (SKILLED_NURSING_FACILITY): Payer: Medicare Other | Admitting: Internal Medicine

## 2014-04-27 DIAGNOSIS — F329 Major depressive disorder, single episode, unspecified: Secondary | ICD-10-CM

## 2014-04-27 DIAGNOSIS — G931 Anoxic brain damage, not elsewhere classified: Secondary | ICD-10-CM

## 2014-04-27 NOTE — Progress Notes (Signed)
Patient ID: [name], male   DOB: July 20, 1966, 48 y.o.   MRN: [id number]            PROGRESS NOTE  DATE: 04/25/2014    [facility]  LEVEL OF CARE: SNF (31)  Acute Visit  CHIEF COMPLAINT:  Manage hypothyroidism.    HISTORY OF PRESENT ILLNESS: I was requested by the staff to assess the patient regarding above problem(s):  HYPOTHYROIDISM: The hypothyroidism is unstable. No complications noted from the medications presently being used.  The staff denies fatigue or constipation.  Last TSH:  On 04/16/2014:  TSH 7.61.   Patient is a poor historian.     PAST MEDICAL HISTORY : Reviewed.  No changes/see problem list  CURRENT MEDICATIONS: Reviewed per MAR/see medication list  PHYSICAL EXAMINATION  VS:  T 97.1       P 86      RR 17      BP 108/62        WT (Lb) 244       GENERAL: no acute distress, moderately obese body habitus NECK: supple, trachea midline, no neck masses, no thyroid tenderness, no thyromegaly RESPIRATORY: breathing is even & unlabored, BS CTAB CARDIAC: RRR, no murmur,no extra heart sounds, no edema     ASSESSMENT/PLAN:    Hypothyroidism.  Uncontrolled problem.  Synthroid was increased.  Recheck TSH in six weeks is pending.     CPT CODE: [id number]       Angela CoxGayani Y Dasanayaka, MD [facility] [phone]

## 2014-05-02 NOTE — Progress Notes (Addendum)
Patient ID: [name], male   DOB: [birth year], 48 y.o.   MRN: [id number]                  PROGRESS NOTE  DATE:  04/27/2014    [facility]    LEVEL OF CARE:   SNF   Acute Visit   CHIEF COMPLAINT:  Follow up depression with agitation, ?psychosis.      HISTORY OF PRESENT ILLNESS:  Mr. Alvester Morinewton has become increasingly dysphoric with restless, agitated behavior.  I had seen him last week for this.  He was on both Risperdal and Abilify.   I stopped the Risperdal, wanted to continue the Abilify.  However, it would appear that that was discontinued by somebody else.  His valproic acid was 41 and I really think this needs to be increased aggressively.    PHYSICAL EXAMINATION:   GENERAL APPEARANCE:  Again, a very dysphoric-looking demeanor.   CARDIOVASCULAR:  CARDIAC:   Heart sounds are normal.  He appears to be euvolemic.    ASSESSMENT/PLAN:  Depression, ?psychosis.  I have resumed the Abilify at 5 mg.  He was also on Effexor XR and this got stopped, although I really do not see the exact reason for this.  The acute response to the current situation should be to stablelize his mood with valproic Acid. I'm suspecting he will need adjustment of his antidepressant medication shorly

## 2014-05-07 ENCOUNTER — Non-Acute Institutional Stay (SKILLED_NURSING_FACILITY): Payer: Medicare Other | Admitting: Internal Medicine

## 2014-05-07 DIAGNOSIS — E039 Hypothyroidism, unspecified: Secondary | ICD-10-CM

## 2014-05-07 DIAGNOSIS — K5900 Constipation, unspecified: Secondary | ICD-10-CM

## 2014-05-07 DIAGNOSIS — I255 Ischemic cardiomyopathy: Secondary | ICD-10-CM

## 2014-05-07 DIAGNOSIS — I2589 Other forms of chronic ischemic heart disease: Secondary | ICD-10-CM

## 2014-05-07 DIAGNOSIS — G931 Anoxic brain damage, not elsewhere classified: Secondary | ICD-10-CM

## 2014-05-07 NOTE — Progress Notes (Signed)
           PROGRESS NOTE  DATE: 05-07-14  FACILITY: [facility]  LEVEL OF CARE: SNF  Routine Visit  CHIEF COMPLAINT:  Manage hypothyroidism, cardiomyopathy and constipation  HISTORY OF PRESENT ILLNESS:  REASSESSMENT OF ONGOING PROBLEM(S):  HYPOTHYROIDISM: The hypothyroidism remains stable. No complications noted from the medications presently being used.  The staff deny constipation.  Last TSH 9.27 in 5/14, 6/14 TSH 5.04, in 9/14 TSH 5.45, in 11-14 TSH 9.64, in 1-15 TSH 11.1, in 5-15 TSH 7.36. Patient is a poor historian secondary to anoxic brain injury.  CONSTIPATION: The constipation remains stable. No complications from the medications presently being used. Staff deny ongoing constipation, abdominal pain, nausea or vomiting.  CARDIOMYOPATHY: The patient's cardiomyopathy remains stable. Staff deny increasing lower extremity swelling, shortness of breath, chest pain, palpitations, orthopnea or PNDs. No complications reported from the medications currently being used.  PAST MEDICAL HISTORY : Reviewed.  No changes.  [facility]  REVIEW OF SYSTEMS: Unobtainable secondary to anoxic brain injury  PHYSICAL EXAMINATION  VS: see VS sign section  GENERAL: no acute distress, obese body habitus EYES: Normal saline, normal conjunctivae, no discharge NECK: supple, trachea midline, no neck masses, no thyroid tenderness, no thyromegaly LYMPHATICS: No cervical lymphadenopathy, no supraclavicular lymphadenopathy RESPIRATORY: breathing is even & unlabored, BS CTAB CARDIAC: RRR, no murmur,no extra heart sounds, no edema GI: abdomen soft, normal BS, no masses, no tenderness, no hepatomegaly, no splenomegaly PSYCHIATRIC: the patient is lethargic, decreased affect & mood  LABS/RADIOLOGY: 5-15 platelets 141 otherwise CBC normal, CMP normal, HDL 31 otherwise fasting lipid panel normal, hemoglobin A1c 5.2, Depakote level 46 11-14 triglycerides 163, HDL 22 otherwise FLP  normal, hemoglobin A1c 5.2, platelets 133 otherwise CBC normal, CMP normal  8-14 Depakote level 11 5/14 fasting lipid panel normal, including A1c 4.9, CMP normal, platelets 139 otherwise CBC normal 2/14 Depakote 6 1/14 he will profile normal, platelets 121 otherwise CBC normal, BMP normal  ASSESSMENT/PLAN:  hypothyroidism-uncontrolled. levothyroxine was increased. Recheck TSH is pending constipation-well-controlled. anoxic brain injury-continue supportive care.   anemia of chronic disease-Hb normalized. Cardiomyopathy-well compensated. Depression-on Effexor  CPT CODE: [id number]  Andreoli PiggGayani Y. Kerry Doryasanayaka, MD [facility] [phone]

## 2014-05-17 ENCOUNTER — Emergency Department (HOSPITAL_COMMUNITY): Payer: Medicare Other

## 2014-05-17 ENCOUNTER — Encounter (HOSPITAL_COMMUNITY): Payer: Self-pay | Admitting: Emergency Medicine

## 2014-05-17 ENCOUNTER — Emergency Department (HOSPITAL_COMMUNITY)
Admission: EM | Admit: 2014-05-17 | Discharge: 2014-05-18 | Disposition: A | Discharge status: Skilled Nursing Facility | Payer: Medicare Other | Attending: Emergency Medicine | Admitting: Emergency Medicine

## 2014-05-17 DIAGNOSIS — IMO0002 Reserved for concepts with insufficient information to code with codable children: Secondary | ICD-10-CM | POA: Diagnosis not present

## 2014-05-17 DIAGNOSIS — H409 Unspecified glaucoma: Secondary | ICD-10-CM | POA: Insufficient documentation

## 2014-05-17 DIAGNOSIS — S0180XA Unspecified open wound of other part of head, initial encounter: Secondary | ICD-10-CM | POA: Insufficient documentation

## 2014-05-17 DIAGNOSIS — G40909 Epilepsy, unspecified, not intractable, without status epilepticus: Secondary | ICD-10-CM | POA: Insufficient documentation

## 2014-05-17 DIAGNOSIS — R4182 Altered mental status, unspecified: Secondary | ICD-10-CM | POA: Insufficient documentation

## 2014-05-17 DIAGNOSIS — Z880 Allergy status to penicillin: Secondary | ICD-10-CM | POA: Insufficient documentation

## 2014-05-17 DIAGNOSIS — K219 Gastro-esophageal reflux disease without esophagitis: Secondary | ICD-10-CM | POA: Insufficient documentation

## 2014-05-17 DIAGNOSIS — Z8701 Personal history of pneumonia (recurrent): Secondary | ICD-10-CM | POA: Diagnosis not present

## 2014-05-17 DIAGNOSIS — F489 Nonpsychotic mental disorder, unspecified: Secondary | ICD-10-CM | POA: Diagnosis not present

## 2014-05-17 DIAGNOSIS — I4891 Unspecified atrial fibrillation: Secondary | ICD-10-CM | POA: Diagnosis not present

## 2014-05-17 DIAGNOSIS — S01112A Laceration without foreign body of left eyelid and periocular area, initial encounter: Secondary | ICD-10-CM

## 2014-05-17 DIAGNOSIS — Y9210 Unspecified residential institution as the place of occurrence of the external cause: Secondary | ICD-10-CM | POA: Insufficient documentation

## 2014-05-17 DIAGNOSIS — I252 Old myocardial infarction: Secondary | ICD-10-CM | POA: Insufficient documentation

## 2014-05-17 DIAGNOSIS — Z91040 Latex allergy status: Secondary | ICD-10-CM | POA: Diagnosis not present

## 2014-05-17 DIAGNOSIS — F411 Generalized anxiety disorder: Secondary | ICD-10-CM | POA: Diagnosis not present

## 2014-05-17 DIAGNOSIS — E039 Hypothyroidism, unspecified: Secondary | ICD-10-CM | POA: Insufficient documentation

## 2014-05-17 DIAGNOSIS — Z87440 Personal history of urinary (tract) infections: Secondary | ICD-10-CM | POA: Diagnosis not present

## 2014-05-17 DIAGNOSIS — W19XXXA Unspecified fall, initial encounter: Secondary | ICD-10-CM

## 2014-05-17 DIAGNOSIS — Z79899 Other long term (current) drug therapy: Secondary | ICD-10-CM | POA: Diagnosis not present

## 2014-05-17 DIAGNOSIS — R296 Repeated falls: Secondary | ICD-10-CM | POA: Insufficient documentation

## 2014-05-17 DIAGNOSIS — Z87820 Personal history of traumatic brain injury: Secondary | ICD-10-CM | POA: Insufficient documentation

## 2014-05-17 DIAGNOSIS — Y9389 Activity, other specified: Secondary | ICD-10-CM | POA: Diagnosis not present

## 2014-05-17 LAB — I-STAT TROPONIN, ED: TROPONIN I, POC: 0 ng/mL (ref 0.00–0.08)

## 2014-05-17 LAB — URINALYSIS, ROUTINE W REFLEX MICROSCOPIC
BILIRUBIN URINE: NEGATIVE
GLUCOSE, UA: NEGATIVE mg/dL
HGB URINE DIPSTICK: NEGATIVE
KETONES UR: NEGATIVE mg/dL
Leukocytes, UA: NEGATIVE
NITRITE: NEGATIVE
PH: 6.5 (ref 5.0–8.0)
Protein, ur: NEGATIVE mg/dL
SPECIFIC GRAVITY, URINE: 1.018 (ref 1.005–1.030)
Urobilinogen, UA: 1 mg/dL (ref 0.0–1.0)

## 2014-05-17 LAB — CBC
HEMATOCRIT: 44 % (ref 39.0–52.0)
Hemoglobin: 14.9 g/dL (ref 13.0–17.0)
MCH: 29.4 pg (ref 26.0–34.0)
MCHC: 33.9 g/dL (ref 30.0–36.0)
MCV: 86.8 fL (ref 78.0–100.0)
PLATELETS: 129 10*3/uL — AB (ref 150–400)
RBC: 5.07 MIL/uL (ref 4.22–5.81)
RDW: 13.4 % (ref 11.5–15.5)
WBC: 6.3 10*3/uL (ref 4.0–10.5)

## 2014-05-17 LAB — BASIC METABOLIC PANEL
BUN: 21 mg/dL (ref 6–23)
CO2: 24 mEq/L (ref 19–32)
CREATININE: 1.2 mg/dL (ref 0.50–1.35)
Calcium: 9.3 mg/dL (ref 8.4–10.5)
Chloride: 101 mEq/L (ref 96–112)
GFR, EST AFRICAN AMERICAN: 82 mL/min — AB (ref >90)
GFR, EST NON AFRICAN AMERICAN: 71 mL/min — AB (ref >90)
Glucose, Bld: 90 mg/dL (ref 70–99)
Potassium: 5.3 mEq/L (ref 3.7–5.3)
SODIUM: 136 meq/L — AB (ref 137–147)

## 2014-05-17 LAB — VALPROIC ACID LEVEL: Valproic Acid Lvl: 54.2 ug/mL (ref 50.0–100.0)

## 2014-05-17 LAB — I-STAT CG4 LACTIC ACID, ED: Lactic Acid, Venous: 1.51 mmol/L (ref 0.5–2.2)

## 2014-05-17 NOTE — ED Notes (Signed)
Initial Contact - pt resting on stretcher, reports c/o pain to RLQ, soft, ttp.  Pt denies n/v/c/d.  Pt able to follow simple directions and answer simple questions.  Awake, alert.  Skin PWD with small laceration to L eye, bleeding controlled.  +csm/+pulses.  NAD.

## 2014-05-17 NOTE — ED Notes (Signed)
Pt to radiology.

## 2014-05-17 NOTE — Discharge Instructions (Signed)
Facial Laceration ° A facial laceration is a cut on the face. These injuries can be painful and cause bleeding. Lacerations usually heal quickly, but they need special care to reduce scarring. °DIAGNOSIS  °Your health care provider will take a medical history, ask for details about how the injury occurred, and examine the wound to determine how deep the cut is. °TREATMENT  °Some facial lacerations may not require closure. Others may not be able to be closed because of an increased risk of infection. The risk of infection and the chance for successful closure will depend on various factors, including the amount of time since the injury occurred. °The wound may be cleaned to help prevent infection. If closure is appropriate, pain medicines may be given if needed. Your health care provider will use stitches (sutures), wound glue (adhesive), or skin adhesive strips to repair the laceration. These tools bring the skin edges together to allow for faster healing and a better cosmetic outcome. If needed, you may also be given a tetanus shot. °HOME CARE INSTRUCTIONS °· Only take over-the-counter or prescription medicines as directed by your health care provider. °· Follow your health care provider's instructions for wound care. These instructions will vary depending on the technique used for closing the wound. °For Sutures: °· Keep the wound clean and dry.   °· If you were given a bandage (dressing), you should change it at least once a day. Also change the dressing if it becomes wet or dirty, or as directed by your health care provider.   °· Wash the wound with soap and water 2 times a day. Rinse the wound off with water to remove all soap. Pat the wound dry with a clean towel.   °· After cleaning, apply a thin layer of the antibiotic ointment recommended by your health care provider. This will help prevent infection and keep the dressing from sticking.   °· You may shower as usual after the first 24 hours. Do not soak the  wound in water until the sutures are removed.   °· Get your sutures removed as directed by your health care provider. With facial lacerations, sutures should usually be taken out after 4 5 days to avoid stitch marks.   °· Wait a few days after your sutures are removed before applying any makeup. °For Skin Adhesive Strips: °· Keep the wound clean and dry.   °· Do not get the skin adhesive strips wet. You may bathe carefully, using caution to keep the wound dry.   °· If the wound gets wet, pat it dry with a clean towel.   °· Skin adhesive strips will fall off on their own. You may trim the strips as the wound heals. Do not remove skin adhesive strips that are still stuck to the wound. They will fall off in time.   °For Wound Adhesive: °· You may briefly wet your wound in the shower or bath. Do not soak or scrub the wound. Do not swim. Avoid periods of heavy sweating until the skin adhesive has fallen off on its own. After showering or bathing, gently pat the wound dry with a clean towel.   °· Do not apply liquid medicine, cream medicine, ointment medicine, or makeup to your wound while the skin adhesive is in place. This may loosen the film before your wound is healed.   °· If a dressing is placed over the wound, be careful not to apply tape directly over the skin adhesive. This may cause the adhesive to be pulled off before the wound is healed.   °·   Avoid prolonged exposure to sunlight or tanning lamps while the skin adhesive is in place. °· The skin adhesive will usually remain in place for 5 10 days, then naturally fall off the skin. Do not pick at the adhesive film.   °After Healing: °Once the wound has healed, cover the wound with sunscreen during the day for 1 full year. This can help minimize scarring. Exposure to ultraviolet light in the first year will darken the scar. It can take 1 2 years for the scar to lose its redness and to heal completely.  °SEEK IMMEDIATE MEDICAL CARE IF: °· You have redness, pain, or  swelling around the wound.   °· You see a yellowish-white fluid (pus) coming from the wound.   °· You have chills or a fever.   °MAKE SURE YOU: °· Understand these instructions. °· Will watch your condition. °· Will get help right away if you are not doing well or get worse. °Document Released: 01/21/2005 Document Revised: 10/04/2013 Document Reviewed: 07/27/2013 °[employer]® Patient Information ©2014 [employer]. ° °Fall Prevention and Home Safety °Falls cause injuries and can affect all age groups. It is possible to use preventive measures to significantly decrease the likelihood of falls. There are many simple measures which can make your home safer and prevent falls. °OUTDOORS °· Repair cracks and edges of walkways and driveways. °· Remove high doorway thresholds. °· Trim shrubbery on the main path into your home. °· Have good outside lighting. °· Clear walkways of tools, rocks, debris, and clutter. °· Check that handrails are not broken and are securely fastened. Both sides of steps should have handrails. °· Have leaves, snow, and ice cleared regularly. °· Use sand or salt on walkways during winter months. °· In the garage, clean up grease or oil spills. °BATHROOM °· Install night lights. °· Install grab bars by the toilet and in the tub and shower. °· Use non-skid mats or decals in the tub or shower. °· Place a plastic non-slip stool in the shower to sit on, if needed. °· Keep floors dry and clean up all water on the floor immediately. °· Remove soap buildup in the tub or shower on a regular basis. °· Secure bath mats with non-slip, double-sided rug tape. °· Remove throw rugs and tripping hazards from the floors. °BEDROOMS °· Install night lights. °· Make sure a bedside light is easy to reach. °· Do not use oversized bedding. °· Keep a telephone by your bedside. °· Have a firm chair with side arms to use for getting dressed. °· Remove throw rugs and tripping hazards from the floor. °KITCHEN °· Keep handles on  pots and pans turned toward the center of the stove. Use back burners when possible. °· Clean up spills quickly and allow time for drying. °· Avoid walking on wet floors. °· Avoid hot utensils and knives. °· Position shelves so they are not too high or low. °· Place commonly used objects within easy reach. °· If necessary, use a sturdy step stool with a grab bar when reaching. °· Keep electrical cables out of the way. °· Do not use floor polish or wax that makes floors slippery. If you must use wax, use non-skid floor wax. °· Remove throw rugs and tripping hazards from the floor. °STAIRWAYS °· Never leave objects on stairs. °· Place handrails on both sides of stairways and use them. Fix any loose handrails. Make sure handrails on both sides of the stairways are as long as the stairs. °· Check carpeting to make sure it   is firmly attached along stairs. Make repairs to worn or loose carpet promptly. °· Avoid placing throw rugs at the top or bottom of stairways, or properly secure the rug with carpet tape to prevent slippage. Get rid of throw rugs, if possible. °· Have an electrician put in a light switch at the top and bottom of the stairs. °OTHER FALL PREVENTION TIPS °· Wear low-heel or rubber-soled shoes that are supportive and fit well. Wear closed toe shoes. °· When using a stepladder, make sure it is fully opened and both spreaders are firmly locked. Do not climb a closed stepladder. °· Add color or contrast paint or tape to grab bars and handrails in your home. Place contrasting color strips on first and last steps. °· Learn and use mobility aids as needed. Install an electrical emergency response system. °· Turn on lights to avoid dark areas. Replace light bulbs that burn out immediately. Get light switches that glow. °· Arrange furniture to create clear pathways. Keep furniture in the same place. °· Firmly attach carpet with non-skid or double-sided tape. °· Eliminate uneven floor surfaces. °· Select a carpet  pattern that does not visually hide the edge of steps. °· Be aware of all pets. °OTHER HOME SAFETY TIPS °· Set the water temperature for 120° F (48.8° C). °· Keep emergency numbers on or near the telephone. °· Keep smoke detectors on every level of the home and near sleeping areas. °Document Released: 12/04/2002 Document Revised: 06/14/2012 Document Reviewed: 03/04/2012 °[employer]® Patient Information ©2014 [employer]. ° °

## 2014-05-17 NOTE — ED Notes (Signed)
Dr. Gwendolyn GrantWalden at bedside for lac repair.

## 2014-05-17 NOTE — ED Notes (Signed)
Per EMS - [facility] with c/o AMS today, uncoopertive.  Pt with decreased cognition at baseline, but follows directions and is cooperative at baseline.  Awake, alert.  Follows commands per EMS.  Nonambulatory at baseline.  Pt with unwitnessed fall while attempting to get into bed alone.  Bleeding controlled, sm lac to L eye.

## 2014-05-17 NOTE — ED Notes (Signed)
[facility] called to provide transport back to facility.  

## 2014-05-17 NOTE — ED Notes (Signed)
Pt unable to void at this time. 

## 2014-05-17 NOTE — ED Provider Notes (Addendum)
CSN: [id number]     Arrival date & time 05/17/14  1919 History   First MD Initiated Contact with Patient 05/17/14 1925     Chief Complaint  Patient presents with  . Altered Mental Status  . Facial Laceration    Level V Caveat - hx of traumatic brain injury  (Consider location/radiation/quality/duration/timing/severity/associated sxs/prior Treatment) Patient is a 48 y.o. male presenting with altered mental status. The history is provided by the nursing home and the patient.  Altered Mental Status Presenting symptoms: behavior changes (mild agitation)   Severity:  Mild Most recent episode:  Today Duration:  1 day Timing:  Constant Progression:  Unchanged Chronicity:  Recurrent Context: nursing home resident   Associated symptoms: agitation   Associated symptoms: no difficulty breathing, no fever, no seizures and no vomiting     Past Medical History  Diagnosis Date  . Addison disease   . Anoxic brain injury   . A-fib   . Seizure disorder   . Dysphagia   . History of recurrent UTIs   . Hyperlipemia   . Aspiration pneumonia   . Mental disorder   . Anxiety   . Reflux   . Dysphagia   . Glucocorticoid deficiency   . Atrial fibrillation   . Glaucoma   . Hypothyroidism   . Encephalopathy   . Quadriplegia   . Addison disease   . Myocardial infarction   . Pneumoperitoneum of unknown etiology 12/21/2011  . Chronic constipation 11/23/2011   Past Surgical History  Procedure Laterality Date  . Nephrectomy  unknown  . Peg tube placement     No family history on file. History  Substance Use Topics  . Smoking status: Never Smoker   . Smokeless tobacco: Never Used  . Alcohol Use: No    Review of Systems  Unable to perform ROS: Other  Constitutional: Negative for fever.  Gastrointestinal: Negative for vomiting.  Neurological: Negative for seizures.  Psychiatric/Behavioral: Positive for agitation.      Allergies  Ativan; Latex; Morphine and related; Penicillins;  Pyridium; and Soy allergy  Home Medications   Prior to Admission medications   Medication Sig Start Date End Date Taking? Authorizing Provider  ARIPiprazole (ABILIFY) 5 MG tablet Take 10 mg by mouth daily.     Historical Provider, MD  diphenhydrAMINE (BENADRYL) 25 mg capsule 25 mg by PEG Tube route every 6 (six) hours as needed. For allergic reaction    Historical Provider, MD  divalproex (DEPAKOTE SPRINKLE) 125 MG capsule Take 375 mg by mouth 2 (two) times daily. Take 3 capsules = 375 mg by mouth twice daily to stabilize mood. 07/18/13   Historical Provider, MD  feeding supplement (PRO-STAT SUGAR FREE 64) LIQD 30 mLs by PEG Tube route 2 (two) times daily.     Historical Provider, MD  ferrous sulfate 220 (44 FE) MG/5ML solution 330 mg by PEG Tube route daily.     Historical Provider, MD  fish oil-omega-3 fatty acids 1000 MG capsule 1 g by PEG Tube route 2 (two) times daily. Via peg tube    Historical Provider, MD  ipratropium-albuterol (DUONEB) 0.5-2.5 (3) MG/3ML SOLN Take 3 mLs by nebulization every 6 (six) hours as needed. For shortness of breath    Historical Provider, MD  levothyroxine (SYNTHROID, LEVOTHROID) 25 MCG tablet Take 100 mcg by mouth daily before breakfast.     Historical Provider, MD  methylcellulose (ARTIFICIAL TEARS) 1 % ophthalmic solution Place 1 drop into both eyes 4 (four) times daily.  Historical Provider, MD  metoprolol tartrate (LOPRESSOR) 25 mg/10 mL SUSP 12.5 mg by PEG Tube route 2 (two) times daily.     Historical Provider, MD  multivitamin/minerals (GOLDEN AGE) LIQD 5 mLs by PEG Tube route daily.    Historical Provider, MD  pantoprazole sodium (PROTONIX) 40 mg/20 mL PACK 40 mg by PEG Tube route daily at 12 noon.  11/30/11   Novlet Adelina MingsJ Davis, MD  polyethylene glycol (MIRALAX / GLYCOLAX) packet 17 g by PEG Tube route 2 (two) times daily. Mix 17gm 4-8 ounces of liquid    Historical Provider, MD  traZODone (DESYREL) 50 MG tablet Take 50 mg by mouth 3 (three) times daily as  needed (ANXIETY).  08/02/13   Historical Provider, MD   BP 94/65  Pulse 80  Temp(Src) 98.5 F (36.9 C) (Oral)  Resp 18  SpO2 98% Physical Exam  Constitutional: He appears well-developed and well-nourished. No distress.  HENT:  Head: Normocephalic.    Mouth/Throat: No oropharyngeal exudate.  Eyes: EOM are normal. Pupils are equal, round, and reactive to light.  Neck: Normal range of motion. Neck supple.  Cardiovascular: Normal rate and regular rhythm.  Exam reveals no friction rub.   No murmur heard. Pulmonary/Chest: Effort normal and breath sounds normal. No respiratory distress. He has no wheezes. He has no rales.  Abdominal: He exhibits no distension. There is tenderness (R side). There is no rebound.  Musculoskeletal: Normal range of motion. He exhibits no edema.  Neurological: He is alert.  Skin: He is not diaphoretic.    ED Course  LACERATION REPAIR Date/Time: 05/17/2014 10:36 PM Performed by: Dagmar HaitWALDEN, WILLIAM Mariell Nester Authorized by: Dagmar HaitWALDEN, WILLIAM Braya Habermehl Consent: Verbal consent obtained. Body area: head/neck Location details: left eyebrow Laceration length: 3 cm   (including critical care time) Labs Review Labs Reviewed  CBC  URINALYSIS, ROUTINE W REFLEX MICROSCOPIC  BASIC METABOLIC PANEL  I-STAT TROPOININ, ED  I-STAT CG4 LACTIC ACID, ED    Imaging Review Dg Chest 2 View  05/17/2014   CLINICAL DATA:  ALTERED MENTAL STATUS FACIAL LACERATION  EXAM: CHEST  2 VIEW  COMPARISON:  DG CHEST 1V PORT dated 09/01/2013  FINDINGS: The heart size and mediastinal contours are within normal limits. The elevated right hemidiaphragm is unchanged. New Both lungs are clear. The visualized skeletal structures are unremarkable.  IMPRESSION: No active cardiopulmonary disease.   Electronically Signed   By: Salome HolmesHector  Cooper M.D.   On: 05/17/2014 21:18   Ct Head Wo Contrast  05/17/2014   CLINICAL DATA:  Laceration above the left eyebrow following a fall today.  EXAM: CT HEAD WITHOUT CONTRAST  CT  MAXILLOFACIAL WITHOUT CONTRAST  TECHNIQUE: Multidetector CT imaging of the head and maxillofacial structures were performed using the standard protocol without intravenous contrast. Multiplanar CT image reconstructions of the maxillofacial structures were also generated.  COMPARISON:  Head CT dated 08/31/2013.  FINDINGS: CT HEAD FINDINGS  Diffusely enlarged ventricles and subarachnoid spaces. Patchy white matter low density in both cerebral hemispheres. No skull fracture, intracranial hemorrhage or paranasal sinus air-fluid levels.  CT MAXILLOFACIAL FINDINGS  No fractures or paranasal sinus air-fluid levels. Small bilateral maxillary sinus retention cysts. Cervical spine degenerative changes.  IMPRESSION: 1. No acute abnormality. 2. Mild to moderate diffuse cerebral and cerebellar atrophy and mild chronic small vessel white matter ischemic changes in both cerebral hemispheres. 3. Cervical spine degenerative changes.   Electronically Signed   By: Gordan PaymentSteve  Reid M.D.   On: 05/17/2014 21:16   Ct Maxillofacial Wo Cm  05/17/2014   CLINICAL DATA:  Laceration above the left eyebrow following a fall today.  EXAM: CT HEAD WITHOUT CONTRAST  CT MAXILLOFACIAL WITHOUT CONTRAST  TECHNIQUE: Multidetector CT imaging of the head and maxillofacial structures were performed using the standard protocol without intravenous contrast. Multiplanar CT image reconstructions of the maxillofacial structures were also generated.  COMPARISON:  Head CT dated 08/31/2013.  FINDINGS: CT HEAD FINDINGS  Diffusely enlarged ventricles and subarachnoid spaces. Patchy white matter low density in both cerebral hemispheres. No skull fracture, intracranial hemorrhage or paranasal sinus air-fluid levels.  CT MAXILLOFACIAL FINDINGS  No fractures or paranasal sinus air-fluid levels. Small bilateral maxillary sinus retention cysts. Cervical spine degenerative changes.  IMPRESSION: 1. No acute abnormality. 2. Mild to moderate diffuse cerebral and cerebellar  atrophy and mild chronic small vessel white matter ischemic changes in both cerebral hemispheres. 3. Cervical spine degenerative changes.   Electronically Signed   By: Gordan PaymentSteve  Reid M.D.   On: 05/17/2014 21:16     EKG Interpretation   Date/Time:  Thursday May 17 2014 19:55:30 EDT Ventricular Rate:  74 PR Interval:  185 QRS Duration: 92 QT Interval:  381 QTC Calculation: 423 R Axis:   51 Text Interpretation:  Sinus rhythm Low voltage, precordial leads Simlar to  prior Confirmed by Gwendolyn GrantWALDEN  MD, Erbie Arment ([id number]) on 05/17/2014 7:58:39 PM      MDM   Final diagnoses:  Fall  Laceration of left eyebrow    34M with hx of anoxic brain injury, Afib, gait problems presents with agitation and falls. Sent from his nursing home - per EMS he was altered today and uncooperative. He has decreased cognition at baseline. Here he is following commands and alert. He is disoriented to the situation. He mumbles, but answers questions. He is bedridden at baseline. L eyebrow lac and swelling. Normal EOMs, PERRL. Fell from standing. Will scan head and face. Will draw labs and check CXR, EKG. Belly soft, benign. He stated some abdominal pain, but no pain on exam. I [facility] - state today he was more agitated. He has periods of agitation about 1-2x per month. Patient was getting up and fell a few times today. He ate dinner without difficulty. He fell after dinner and hit his L eye.  Baseline is lying in the bed, cursing mostly. When he is in a good mood he will get up and move around. He needs help with ambulation always. Labs ok, CTs ok. Lac repair as above. Patient doing well, states still having some mild abdominal pain. No pain on deep palpation, no peritoneal signs. No need for imaging. Stable for discharge.    Dagmar HaitWilliam Tosha Belgarde, MD 05/17/14 [phone]  Dagmar HaitWilliam Rush Salce, MD 05/25/14 [phone]

## 2014-05-17 NOTE — ED Notes (Signed)
EKG given to EDP,Waldon,MD., for review. 

## 2014-05-17 NOTE — ED Notes (Signed)
Bed: WA03 Expected date:  Expected time:  Means of arrival:  Comments: 48 y/o M, AMS, fall

## 2014-06-13 ENCOUNTER — Non-Acute Institutional Stay (SKILLED_NURSING_FACILITY): Payer: Medicare Other | Admitting: Internal Medicine

## 2014-06-13 DIAGNOSIS — E039 Hypothyroidism, unspecified: Secondary | ICD-10-CM

## 2014-06-18 ENCOUNTER — Non-Acute Institutional Stay (SKILLED_NURSING_FACILITY): Payer: Medicare Other | Admitting: Internal Medicine

## 2014-06-18 DIAGNOSIS — E039 Hypothyroidism, unspecified: Secondary | ICD-10-CM

## 2014-06-18 DIAGNOSIS — K5900 Constipation, unspecified: Secondary | ICD-10-CM

## 2014-06-18 DIAGNOSIS — I2589 Other forms of chronic ischemic heart disease: Secondary | ICD-10-CM

## 2014-06-18 DIAGNOSIS — I255 Ischemic cardiomyopathy: Secondary | ICD-10-CM

## 2014-06-18 DIAGNOSIS — G931 Anoxic brain damage, not elsewhere classified: Secondary | ICD-10-CM

## 2014-06-18 NOTE — Progress Notes (Signed)
          PROGRESS NOTE  DATE: 06-18-14  FACILITY: [facility]  LEVEL OF CARE: SNF  Routine Visit  CHIEF COMPLAINT:  Manage hypothyroidism, cardiomyopathy and constipation  HISTORY OF PRESENT ILLNESS:  REASSESSMENT OF ONGOING PROBLEM(S):  HYPOTHYROIDISM: The hypothyroidism remains stable. No complications noted from the medications presently being used.  The staff deny constipation.  Last TSH 9.27 in 5/14, 6/14 TSH 5.04, in 9/14 TSH 5.45, in 11-14 TSH 9.64, in 1-15 TSH 11.1, in 5-15 TSH 7.36, in 6-15 TSH 6.31. Patient is a poor historian secondary to anoxic brain injury.  CONSTIPATION: The constipation remains stable. No complications from the medications presently being used. Staff deny ongoing constipation, abdominal pain, nausea or vomiting.  CARDIOMYOPATHY: The patient's cardiomyopathy remains stable. Staff deny increasing lower extremity swelling, shortness of breath, chest pain, palpitations, orthopnea or PNDs. No complications reported from the medications currently being used.  PAST MEDICAL HISTORY : Reviewed.  No changes.  CURRENT MEDICATIONS: [facility]  REVIEW OF SYSTEMS: Unobtainable secondary to anoxic brain injury  PHYSICAL EXAMINATION  VS: see VS sign section  GENERAL: no acute distress, obese body habitus NECK: supple, trachea midline, no neck masses, no thyroid tenderness, no thyromegaly RESPIRATORY: breathing is even & unlabored, BS CTAB CARDIAC: RRR, no murmur,no extra heart sounds, no edema GI: abdomen soft, normal BS, no masses, no tenderness, no hepatomegaly, no splenomegaly PSYCHIATRIC: the patient is lethargic, decreased affect & mood  LABS/RADIOLOGY: 5-15 platelets 141 otherwise CBC normal, CMP normal, HDL 31 otherwise fasting lipid panel normal, hemoglobin A1c 5.2, Depakote level 46 11-14 triglycerides 163, HDL 22 otherwise FLP normal, hemoglobin A1c 5.2, platelets 133 otherwise CBC normal, CMP normal  8-14 Depakote level 11 5/14 fasting  lipid panel normal, including A1c 4.9, CMP normal, platelets 139 otherwise CBC normal 2/14 Depakote 6 1/14 he will profile normal, platelets 121 otherwise CBC normal, BMP normal  ASSESSMENT/PLAN:  hypothyroidism-uncontrolled. Recheck TSH in 3 months is pending constipation-well-controlled. anoxic brain injury-continue supportive care.   anemia of chronic disease-Hb normalized. Discontinue iron. Recheck hemoglobin level in 3 months. Cardiomyopathy-well compensated. Depression-on Effexor. abilify increased  CPT CODE: 1610999308  Kingbird PiggGayani Y. Kerry Doryasanayaka, MD [facility] [phone]

## 2014-06-19 IMAGING — CT CT MAXILLOFACIAL W/O CM
1 of 3 series · 15 of 30 positions shown, 19 images · non-contrast
Comparison: Head CT dated 08/31/2013.

CLINICAL DATA: Laceration above the left eyebrow following a fall
today.

EXAM:
CT HEAD WITHOUT CONTRAST
CT MAXILLOFACIAL WITHOUT CONTRAST
TECHNIQUE: Multidetector CT imaging of the head and maxillofacial structures
were performed using the standard protocol without intravenous
contrast. Multiplanar CT image reconstructions of the maxillofacial
structures were also generated.

[Series 3: facial st · axial · 0.38mm/px · z∈[-68,+80]mm · 15 of 84 slices shown, 19 images]
[im 5/84  brain]
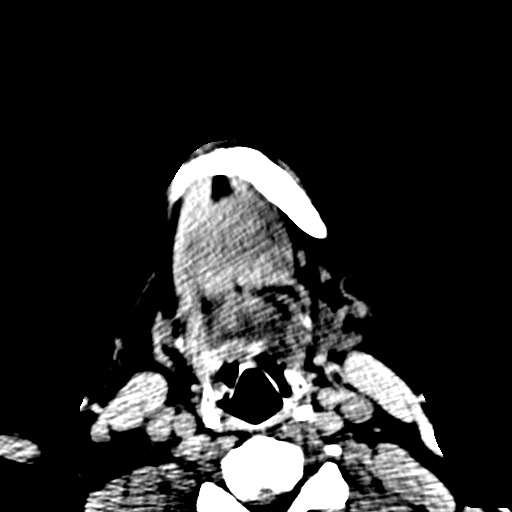
[im 5/84  bone]
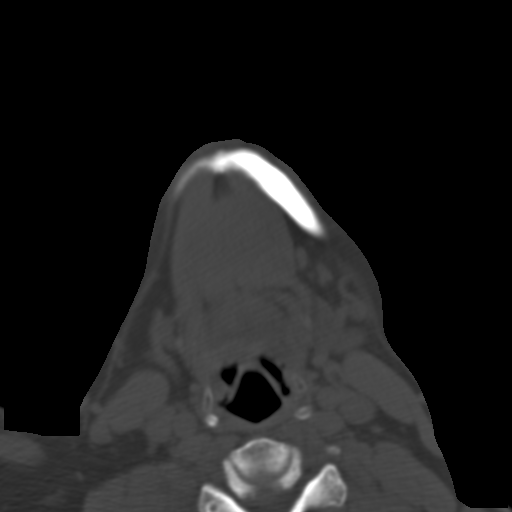
[im 10/84  bone]
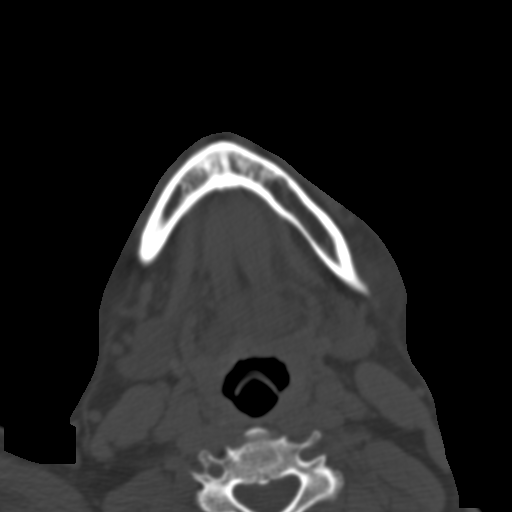
[im 15/84  bone]
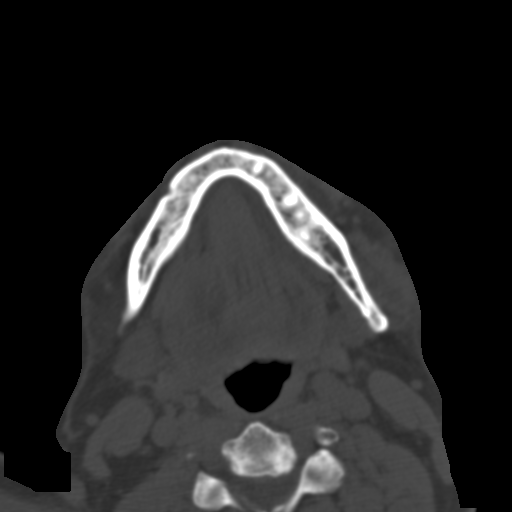
[im 20/84  bone]
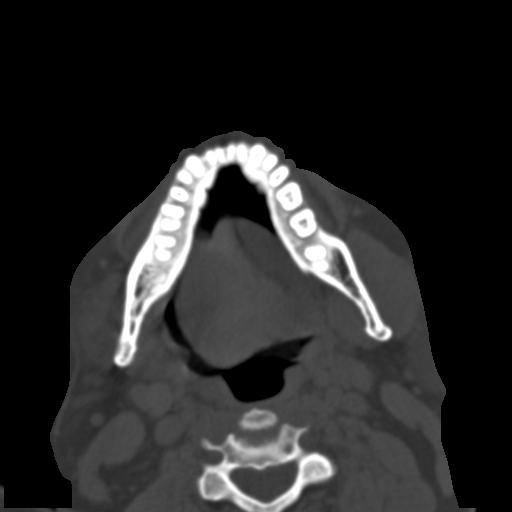
[im 25/84  brain]
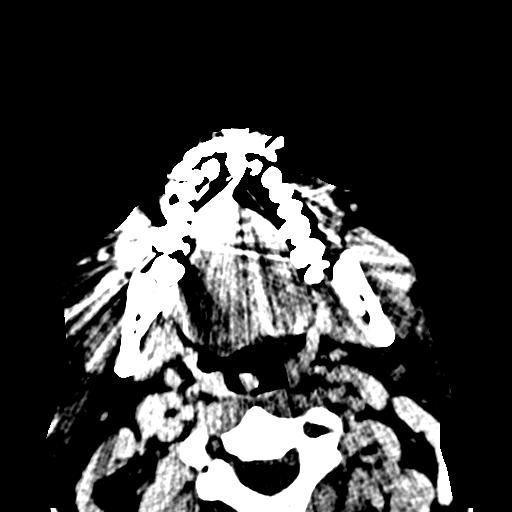
[im 25/84  bone]
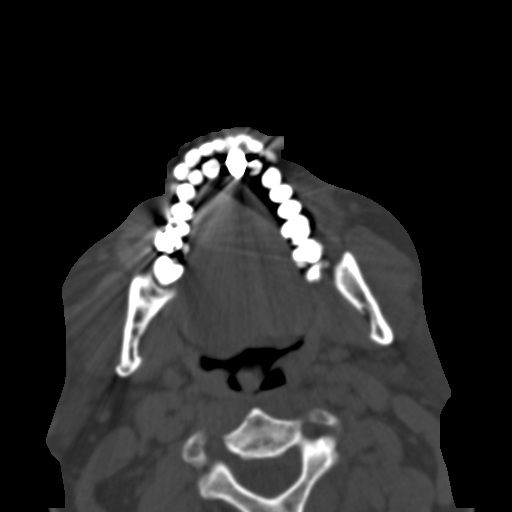
[im 30/84  bone]
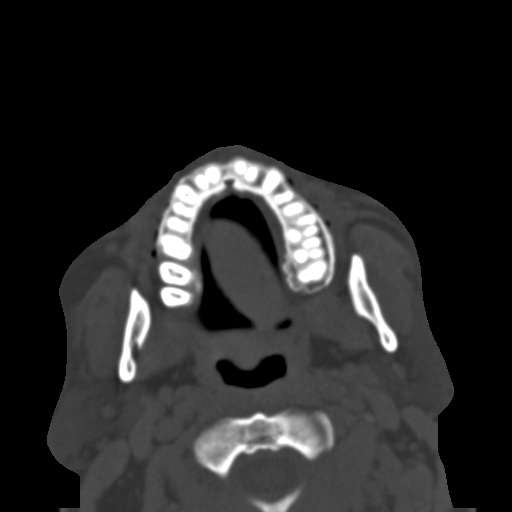
[im 35/84  bone]
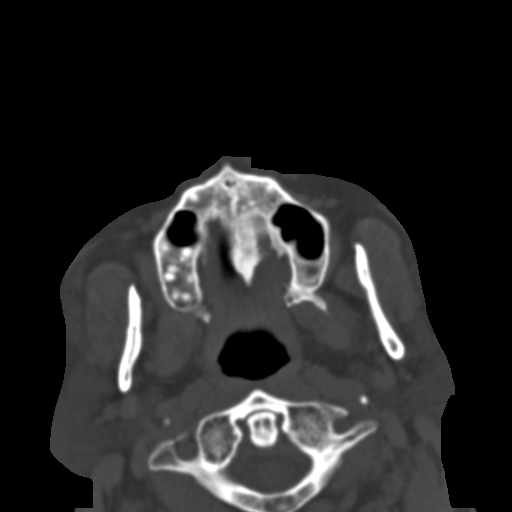
[im 44/84  bone]
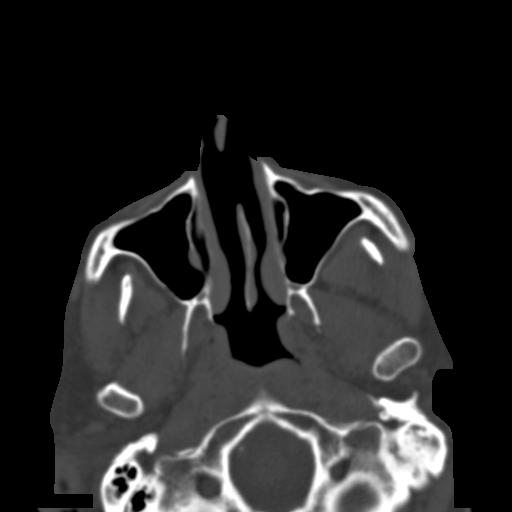
[im 49/84  brain]
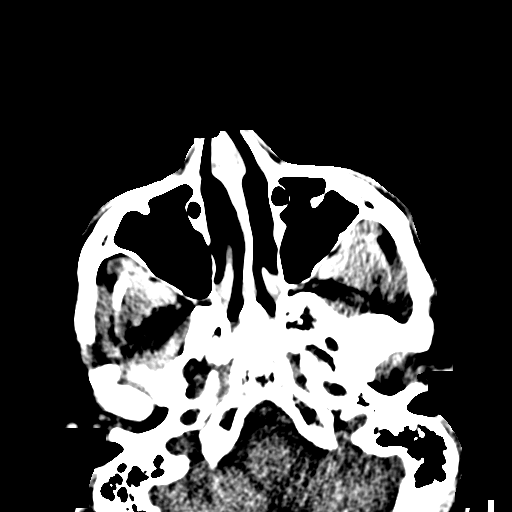
[im 49/84  bone]
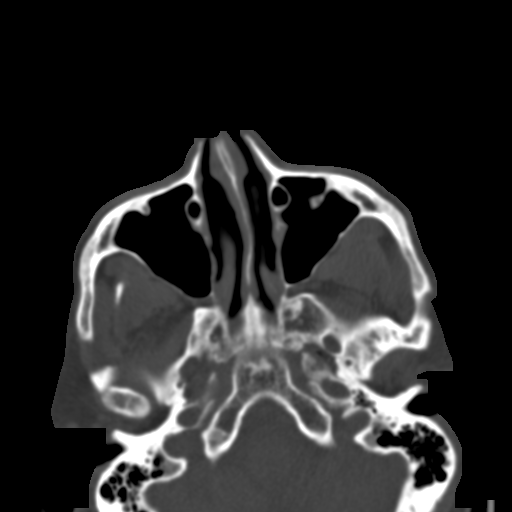
[im 54/84  bone]
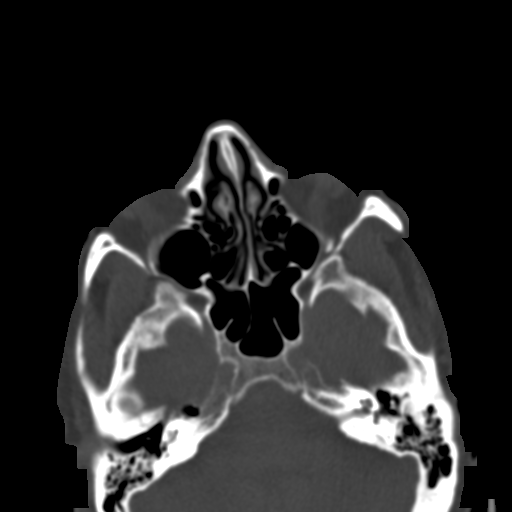
[im 59/84  bone]
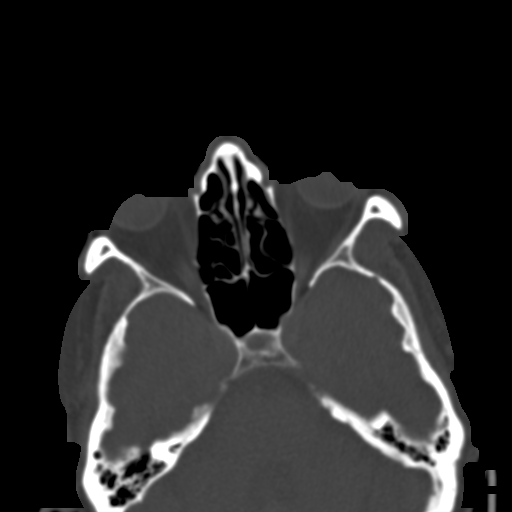
[im 64/84  bone]
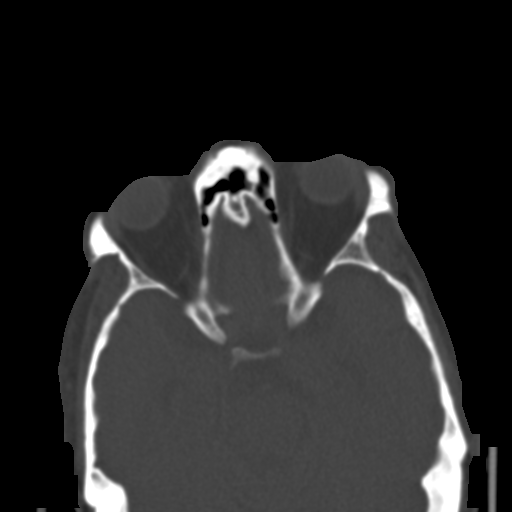
[im 69/84  brain]
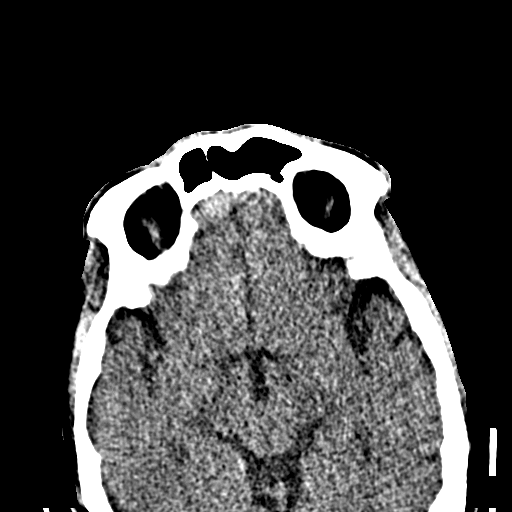
[im 69/84  bone]
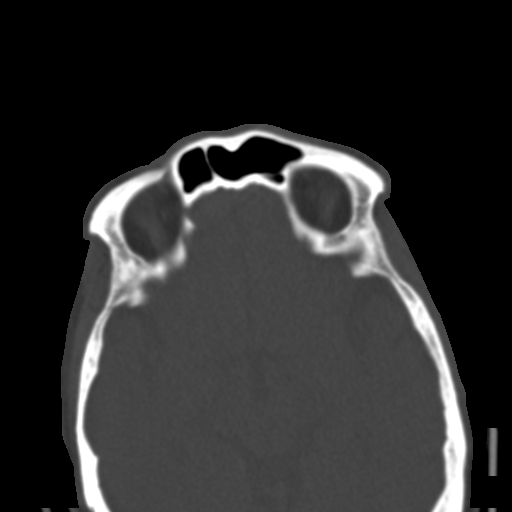
[im 74/84  bone]
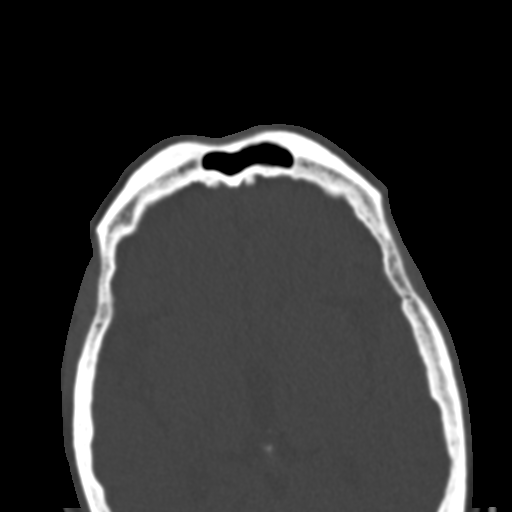
[im 79/84  bone]
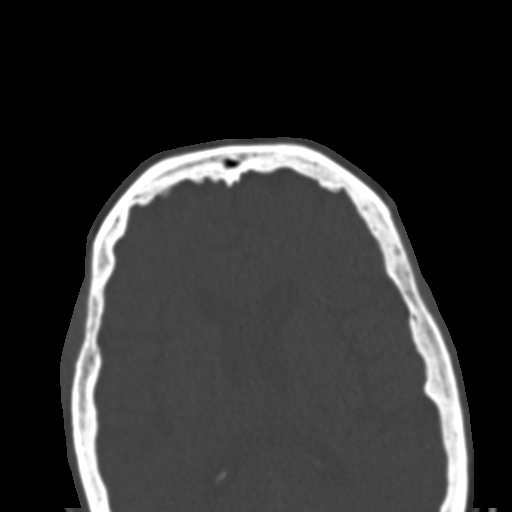

[15 of 30 positions shown; findings below may reference images not displayed]

FINDINGS: CT HEAD FINDINGS

Diffusely enlarged ventricles and subarachnoid spaces. Patchy white
matter low density in both cerebral hemispheres. No skull fracture,
intracranial hemorrhage or paranasal sinus air-fluid levels.

CT MAXILLOFACIAL FINDINGS

No fractures or paranasal sinus air-fluid levels. Small bilateral
maxillary sinus retention cysts. Cervical spine degenerative
changes.
IMPRESSION: 1. No acute abnormality.
2. Mild to moderate diffuse cerebral and cerebellar atrophy and mild
chronic small vessel white matter ischemic changes in both cerebral
hemispheres.
3. Cervical spine degenerative changes.

## 2014-06-19 IMAGING — CR DG CHEST 2V
2 series · 2 of 2 positions shown · non-contrast
Comparison: DG CHEST 1V PORT dated 09/01/2013

CLINICAL DATA: ALTERED MENTAL STATUS FACIAL LACERATION

EXAM:
CHEST  2 VIEW

[w chest lat]
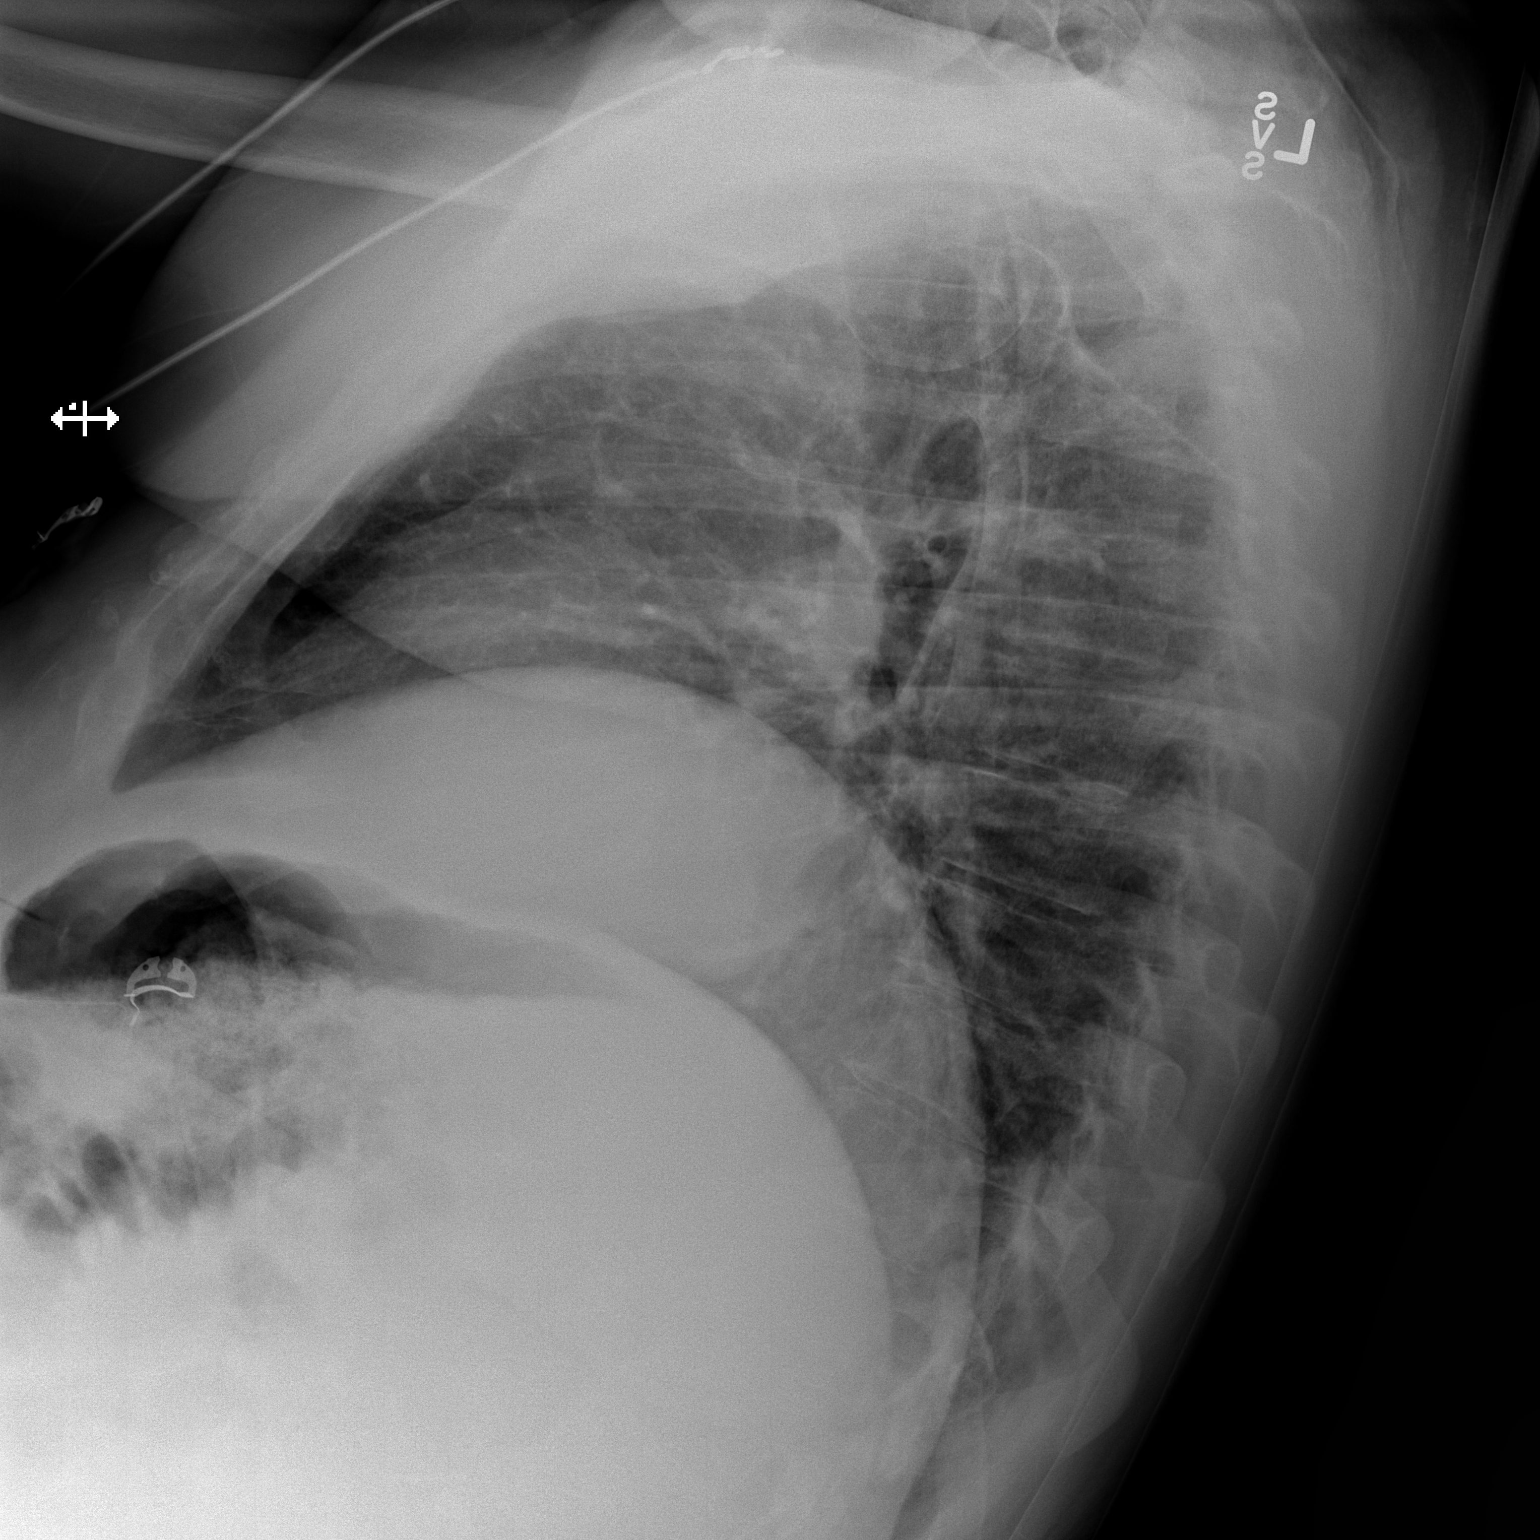

[x chest ap]
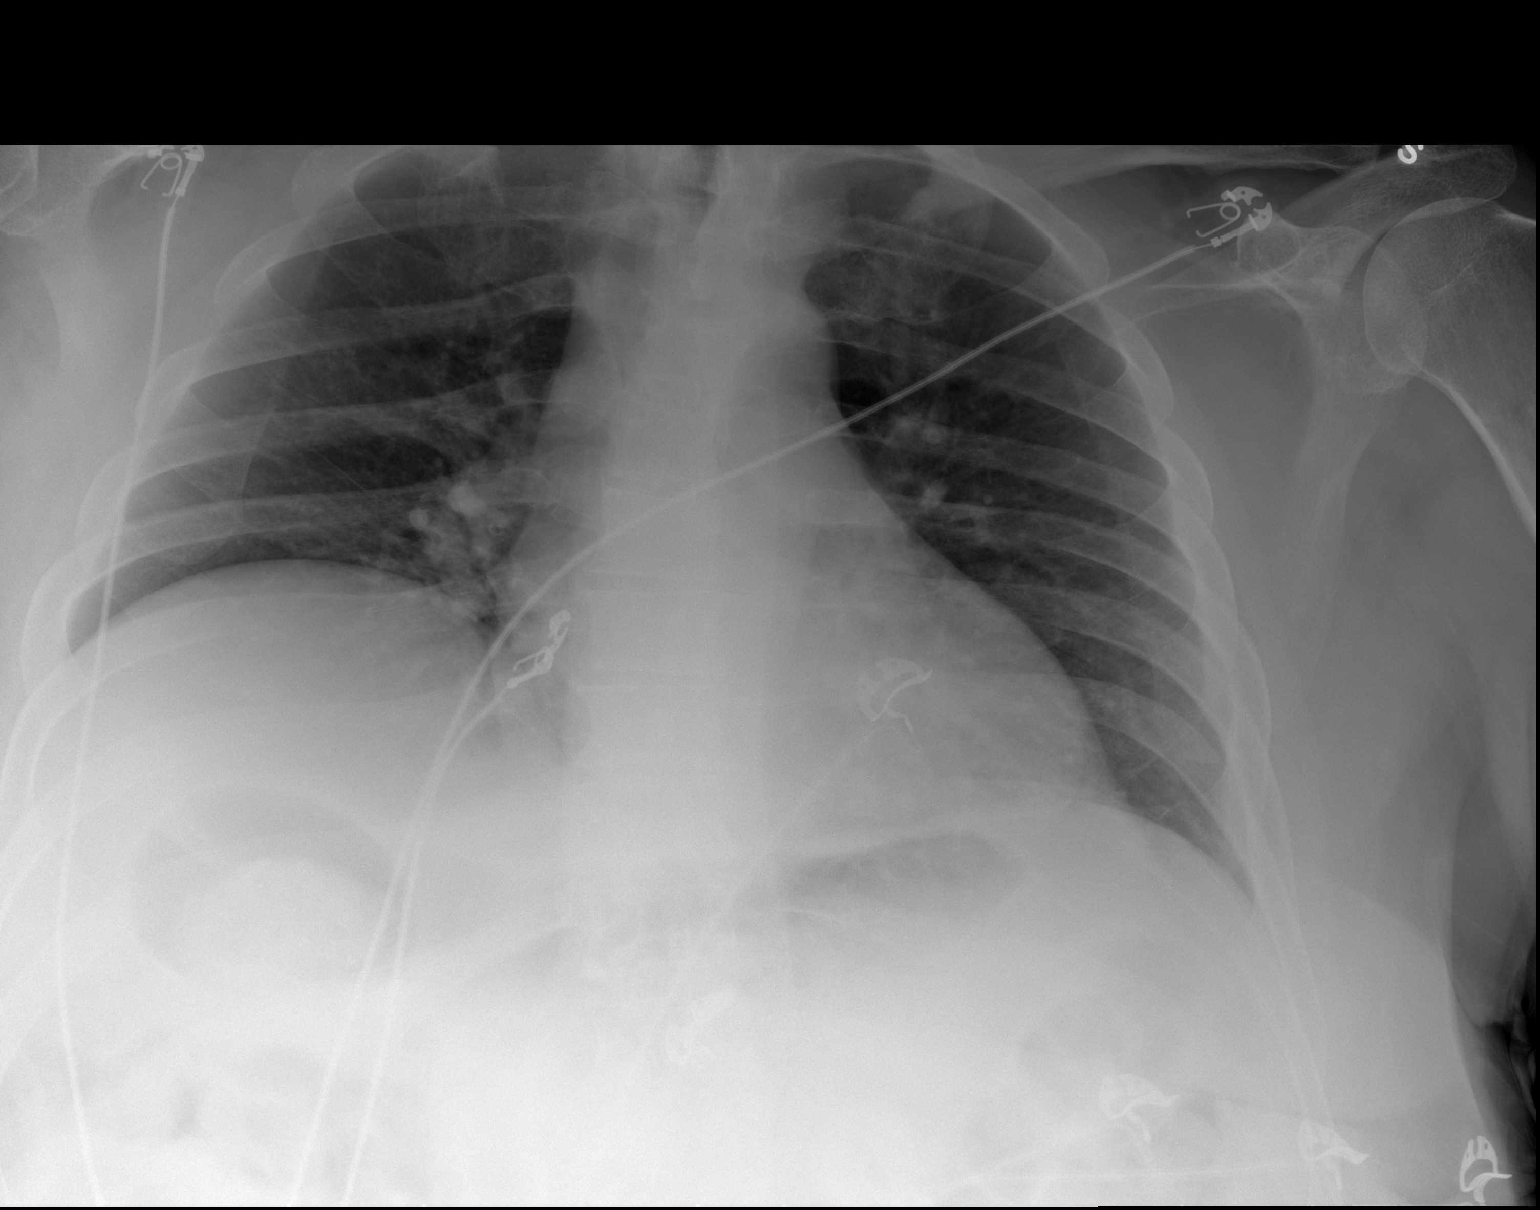

[2 of 2 positions shown; findings below may reference images not displayed]

FINDINGS: The heart size and mediastinal contours are within normal limits.
The elevated right hemidiaphragm is unchanged. New Both lungs are
clear. The visualized skeletal structures are unremarkable.
IMPRESSION: No active cardiopulmonary disease.

## 2014-06-20 NOTE — Progress Notes (Signed)
Patient ID: [name], male   DOB: 24-Aug-1966, 48 y.o.   MRN: [id number]            PROGRESS NOTE  DATE: 06/13/2014        [facility]  LEVEL OF CARE: SNF (31)  Acute Visit  CHIEF COMPLAINT:  Manage hypothyroidism.    HISTORY OF PRESENT ILLNESS: I was requested by the staff to assess the patient regarding above problem(s):  HYPOTHYROIDISM: The hypothyroidism is unstable. No complications noted from the medications presently being used.  The staff denies fatigue or constipation.  Last TSH:  On 06/11/2014:  TSH 6.31.  Patient does not follow commands due to anoxic brain injury.    PAST MEDICAL HISTORY : Reviewed.  No changes/see problem list  CURRENT MEDICATIONS: Reviewed per MAR/see medication list  REVIEW OF SYSTEMS:  Unobtainable due to anoxic brain injury.    PHYSICAL EXAMINATION  VS:  T 96.5       P 70      RR 20      BP 114/70       WT (Lb) 244       GENERAL: no acute distress, moderately obese body habitus NECK: supple, trachea midline, no neck masses, no thyroid tenderness, no thyromegaly RESPIRATORY: breathing is even & unlabored, BS CTAB CARDIAC: RRR, no murmur,no extra heart sounds, no edema GI: abdomen soft, normal BS, no masses, no tenderness, no hepatomegaly, no splenomegaly PSYCHIATRIC: the patient is alert, unable to assess orientation, affect & behavior appropriate  ASSESSMENT/PLAN:  Hypothyroidism.  Unstable, but TSH is less than 10.  Therefore, continue current levothyroxine dose.  Check TSH in three months.    CPT CODE: [id number]         Angela CoxGayani Y Enos Muhl, MD [facility] [phone]

## 2014-06-22 ENCOUNTER — Non-Acute Institutional Stay (SKILLED_NURSING_FACILITY): Payer: Medicare Other | Admitting: Internal Medicine

## 2014-06-22 DIAGNOSIS — R451 Restlessness and agitation: Secondary | ICD-10-CM

## 2014-06-22 DIAGNOSIS — IMO0002 Reserved for concepts with insufficient information to code with codable children: Secondary | ICD-10-CM

## 2014-07-03 DIAGNOSIS — R451 Restlessness and agitation: Secondary | ICD-10-CM | POA: Insufficient documentation

## 2014-07-03 NOTE — Progress Notes (Signed)
Patient ID: [name], male   DOB: September 10, 1966, 48 y.o.   MRN: [id number]            PROGRESS NOTE  DATE: 06/22/2014        [facility]  LEVEL OF CARE: SNF (31)  Acute Visit  CHIEF COMPLAINT:  Manage agitation.    HISTORY OF PRESENT ILLNESS: I was requested by the staff to assess the patient regarding above problem(s):  Staff report that patient was agitated two days ago and not manageable.  Medications were ineffective.  Due to anoxic brain injury, the patient does not follow commands.  He does have a history of psychosis and is followed by Psychiatry service.    PAST MEDICAL HISTORY : Reviewed.  No changes/see problem list  CURRENT MEDICATIONS: Reviewed per MAR/see medication list  PHYSICAL EXAMINATION  VS:  T 98       P 82      RR 20      BP 108/76     POX 96%       WT (Lb) 252        GENERAL: no acute distress, normal body habitus RESPIRATORY: breathing is even & unlabored, BS CTAB CARDIAC: RRR, no murmur,no extra heart sounds, no edema PSYCHIATRIC: the patient is alert, unable to assess orientation, affect & mood appropriate  ASSESSMENT/PLAN:  Agitation.  Patient is calm now.  He is on Depakote and Abilify.  Requested staff to notify us if recurrent agitation issues.    CPT CODE: 9562199307         Angela CoxGayani Y Malayasia Mirkin, MD [facility] [phone]

## 2014-07-19 ENCOUNTER — Non-Acute Institutional Stay (SKILLED_NURSING_FACILITY): Payer: Medicare Other | Admitting: Internal Medicine

## 2014-07-19 DIAGNOSIS — E039 Hypothyroidism, unspecified: Secondary | ICD-10-CM

## 2014-07-19 DIAGNOSIS — I255 Ischemic cardiomyopathy: Secondary | ICD-10-CM

## 2014-07-19 DIAGNOSIS — K5900 Constipation, unspecified: Secondary | ICD-10-CM

## 2014-07-19 DIAGNOSIS — G931 Anoxic brain damage, not elsewhere classified: Secondary | ICD-10-CM

## 2014-07-19 DIAGNOSIS — I2589 Other forms of chronic ischemic heart disease: Secondary | ICD-10-CM

## 2014-07-19 NOTE — Progress Notes (Signed)
          PROGRESS NOTE  DATE: 07-19-14  FACILITY: [facility]  LEVEL OF CARE: SNF  Routine Visit  CHIEF COMPLAINT:  Manage hypothyroidism, cardiomyopathy and constipation  HISTORY OF PRESENT ILLNESS:  REASSESSMENT OF ONGOING PROBLEM(S):  HYPOTHYROIDISM: The hypothyroidism remains stable. No complications noted from the medications presently being used.  The staff deny constipation.  Last TSH 9.27 in 5/14, 6/14 TSH 5.04, in 9/14 TSH 5.45, in 11-14 TSH 9.64, in 1-15 TSH 11.1, in 5-15 TSH 7.36, in 6-15 TSH 6.31. Patient is a poor historian secondary to anoxic brain injury.  CONSTIPATION: The constipation remains stable. No complications from the medications presently being used. Staff deny ongoing constipation, abdominal pain, nausea or vomiting.  CARDIOMYOPATHY: The patient's cardiomyopathy remains stable. Staff deny increasing lower extremity swelling, shortness of breath, chest pain, palpitations, orthopnea or PNDs. No complications reported from the medications currently being used.  PAST MEDICAL HISTORY : Reviewed.  No changes.  [facility]S: [facility]  REVIEW OF SYSTEMS: Unobtainable secondary to anoxic brain injury  PHYSICAL EXAMINATION  VS: see VS sign section  GENERAL: no acute distress, obese body habitus NECK: supple, trachea midline, no neck masses, no thyroid tenderness, no thyromegaly RESPIRATORY: breathing is even & unlabored, BS CTAB CARDIAC: RRR, no murmur,no extra heart sounds, no edema GI: abdomen soft, normal BS, no masses, no tenderness, no hepatomegaly, no splenomegaly PSYCHIATRIC: the patient is lethargic, decreased affect & mood  LABS/RADIOLOGY: 5-15 platelets 141 otherwise CBC normal, CMP normal, HDL 31 otherwise fasting lipid panel normal, hemoglobin A1c 5.2, Depakote level 46 11-14 triglycerides 163, HDL 22 otherwise FLP normal, hemoglobin A1c 5.2, platelets 133 otherwise CBC normal, CMP normal  8-14 Depakote level 11 5/14 fasting  lipid panel normal, including A1c 4.9, CMP normal, platelets 139 otherwise CBC normal 2/14 Depakote 6 1/14 he will profile normal, platelets 121 otherwise CBC normal, BMP normal  ASSESSMENT/PLAN:  hypothyroidism-uncontrolled. Recheck TSH in 2 months is pending constipation-well-controlled. anoxic brain injury-continue supportive care.   anemia of chronic disease-Hb normalized. iron discontinued. Recheck hemoglobin level in 2 months pending. Cardiomyopathy-well compensated. Depression-on Effexor and abilify .  CPT CODE: 1610999308  Steven PiggGayani Y. Kerry Doryasanayaka, MD [facility] [phone]

## 2014-08-29 ENCOUNTER — Non-Acute Institutional Stay (SKILLED_NURSING_FACILITY): Payer: Medicare Other | Admitting: Internal Medicine

## 2014-08-29 DIAGNOSIS — E039 Hypothyroidism, unspecified: Secondary | ICD-10-CM

## 2014-08-29 DIAGNOSIS — I2589 Other forms of chronic ischemic heart disease: Secondary | ICD-10-CM

## 2014-08-29 DIAGNOSIS — G931 Anoxic brain damage, not elsewhere classified: Secondary | ICD-10-CM

## 2014-08-29 DIAGNOSIS — I255 Ischemic cardiomyopathy: Secondary | ICD-10-CM

## 2014-08-29 DIAGNOSIS — K5900 Constipation, unspecified: Secondary | ICD-10-CM

## 2014-08-31 NOTE — Progress Notes (Signed)
          PROGRESS NOTE  DATE: 08-29-14  FACILITY: [facility]  LEVEL OF CARE: SNF  Routine Visit  CHIEF COMPLAINT:  Manage hypothyroidism, cardiomyopathy and constipation  HISTORY OF PRESENT ILLNESS:  REASSESSMENT OF ONGOING PROBLEM(S):  HYPOTHYROIDISM: The hypothyroidism remains stable. No complications noted from the medications presently being used.  The staff deny constipation.  Last TSH 9.27 in 5/14, 6/14 TSH 5.04, in 9/14 TSH 5.45, in 11-14 TSH 9.64, in 1-15 TSH 11.1, in 5-15 TSH 7.36, in 6-15 TSH 6.31. Patient is a poor historian secondary to anoxic brain injury.  CONSTIPATION: The constipation remains stable. No complications from the medications presently being used. Staff deny ongoing constipation, abdominal pain, nausea or vomiting.  CARDIOMYOPATHY: The patient's cardiomyopathy remains stable. Staff deny increasing lower extremity swelling, shortness of breath, chest pain, palpitations, orthopnea or PNDs. No complications reported from the medications currently being used.  PAST MEDICAL HISTORY : Reviewed.  No changes.  CURRENT MEDICATIONS: Reviewed per [facility]  REVIEW OF SYSTEMS: Unobtainable secondary to anoxic brain injury  PHYSICAL EXAMINATION  VS: see VS sign section  GENERAL: no acute distress, obese body habitus NECK: supple, trachea midline, no neck masses, no thyroid tenderness, no thyromegaly RESPIRATORY: breathing is even & unlabored, BS CTAB CARDIAC: RRR, no murmur,no extra heart sounds, no edema GI: abdomen soft, normal BS, no masses, no tenderness, no hepatomegaly, no splenomegaly, has peg PSYCHIATRIC: the patient is lethargic, decreased affect & mood  LABS/RADIOLOGY: 8-15 Depakote level 26 5-15 platelets 141 otherwise CBC normal, CMP normal, HDL 31 otherwise fasting lipid panel normal, hemoglobin A1c 5.2, Depakote level 46 11-14 triglycerides 163, HDL 22 otherwise FLP normal, hemoglobin A1c 5.2, platelets 133 otherwise CBC normal, CMP normal  8-14  Depakote level 11 5/14 fasting lipid panel normal, including A1c 4.9, CMP normal, platelets 139 otherwise CBC normal 2/14 Depakote 6 1/14 he will profile normal, platelets 121 otherwise CBC normal, BMP normal  ASSESSMENT/PLAN:  hypothyroidism-uncontrolled. Recheck TSH. constipation-well-controlled. anoxic brain injury-continue supportive care.   anemia of chronic disease-Hb normalized. iron discontinued. Recheck hemoglobin. Cardiomyopathy-well compensated. Depression-on Effexor and abilify .  CPT CODE: 11914  Eisel Pigg. Kerry Dory, MD [facility] [phone]

## 2014-10-06 ENCOUNTER — Emergency Department (HOSPITAL_COMMUNITY)
Admission: EM | Admit: 2014-10-06 | Discharge: 2014-10-06 | Disposition: A | Discharge status: Home / Self Care | Payer: Medicare Other | Attending: Emergency Medicine | Admitting: Emergency Medicine

## 2014-10-06 ENCOUNTER — Emergency Department (HOSPITAL_COMMUNITY): Payer: Medicare Other

## 2014-10-06 ENCOUNTER — Encounter (HOSPITAL_COMMUNITY): Payer: Self-pay | Admitting: Emergency Medicine

## 2014-10-06 DIAGNOSIS — Z8679 Personal history of other diseases of the circulatory system: Secondary | ICD-10-CM | POA: Diagnosis not present

## 2014-10-06 DIAGNOSIS — I252 Old myocardial infarction: Secondary | ICD-10-CM | POA: Insufficient documentation

## 2014-10-06 DIAGNOSIS — K5900 Constipation, unspecified: Secondary | ICD-10-CM | POA: Diagnosis not present

## 2014-10-06 DIAGNOSIS — E271 Primary adrenocortical insufficiency: Secondary | ICD-10-CM | POA: Diagnosis not present

## 2014-10-06 DIAGNOSIS — Z431 Encounter for attention to gastrostomy: Secondary | ICD-10-CM | POA: Diagnosis present

## 2014-10-06 DIAGNOSIS — E039 Hypothyroidism, unspecified: Secondary | ICD-10-CM | POA: Insufficient documentation

## 2014-10-06 DIAGNOSIS — Z8701 Personal history of pneumonia (recurrent): Secondary | ICD-10-CM | POA: Insufficient documentation

## 2014-10-06 DIAGNOSIS — H409 Unspecified glaucoma: Secondary | ICD-10-CM | POA: Insufficient documentation

## 2014-10-06 DIAGNOSIS — K219 Gastro-esophageal reflux disease without esophagitis: Secondary | ICD-10-CM | POA: Insufficient documentation

## 2014-10-06 DIAGNOSIS — K9423 Gastrostomy malfunction: Secondary | ICD-10-CM | POA: Insufficient documentation

## 2014-10-06 DIAGNOSIS — F419 Anxiety disorder, unspecified: Secondary | ICD-10-CM | POA: Insufficient documentation

## 2014-10-06 DIAGNOSIS — Z87440 Personal history of urinary (tract) infections: Secondary | ICD-10-CM | POA: Diagnosis not present

## 2014-10-06 DIAGNOSIS — Z91040 Latex allergy status: Secondary | ICD-10-CM | POA: Insufficient documentation

## 2014-10-06 DIAGNOSIS — K9420 Gastrostomy complication, unspecified: Secondary | ICD-10-CM

## 2014-10-06 DIAGNOSIS — G40909 Epilepsy, unspecified, not intractable, without status epilepticus: Secondary | ICD-10-CM | POA: Diagnosis not present

## 2014-10-06 DIAGNOSIS — G931 Anoxic brain damage, not elsewhere classified: Secondary | ICD-10-CM | POA: Insufficient documentation

## 2014-10-06 DIAGNOSIS — Z79899 Other long term (current) drug therapy: Secondary | ICD-10-CM | POA: Diagnosis not present

## 2014-10-06 MED ORDER — IOHEXOL 300 MG/ML  SOLN
50.0000 mL | Freq: Once | INTRAMUSCULAR | Status: AC | PRN
Start: 2014-10-06 — End: 2014-10-06
  Administered 2014-10-06: 50 mL via ORAL

## 2014-10-06 MED ORDER — MIDAZOLAM HCL 5 MG/5ML IJ SOLN: 5.0000 mg | Freq: Once | INTRAMUSCULAR | Status: DC

## 2014-10-06 NOTE — ED Notes (Signed)
Patient still waiting for PTAR. 

## 2014-10-06 NOTE — ED Notes (Signed)
Bed: AV40WA22 Expected date: 10/06/14 Expected time: 1:31 PM Means of arrival: Ambulance Comments: G Tube replacement-

## 2014-10-06 NOTE — ED Provider Notes (Signed)
CSN: [id number]     Arrival date & time 10/06/14  1342 History   First MD Initiated Contact with Patient 10/06/14 1355       Level V caveat: Noncommunicative  HPI Patient presents to the emergency Department with complaints of G-tube not working.  Patient hasn't an anoxic brain injury.  He is unable to provide any history.  He is a [address] [place] G-tube and the taut became broken off earlier today.   Past Medical History  Diagnosis Date  . Addison disease   . Anoxic brain injury   . A-fib   . Seizure disorder   . Dysphagia   . History of recurrent UTIs   . Hyperlipemia   . Aspiration pneumonia   . Mental disorder   . Anxiety   . Reflux   . Dysphagia   . Glucocorticoid deficiency   . Atrial fibrillation   . Glaucoma   . Hypothyroidism   . Encephalopathy   . Quadriplegia   . Addison disease   . Myocardial infarction   . Pneumoperitoneum of unknown etiology 12/21/2011  . Chronic constipation 11/23/2011   Past Surgical History  Procedure Laterality Date  . Nephrectomy  unknown  . Peg tube placement     No family history on file. History  Substance Use Topics  . Smoking status: Never Smoker   . Smokeless tobacco: Never Used  . Alcohol Use: No    Review of Systems  Unable to perform ROS     Allergies  Ativan; Latex; Morphine and related; Penicillins; Pyridium; and Soy allergy  Home Medications   Prior to Admission medications   Medication Sig Start Date End Date Taking? Authorizing Provider  ARIPiprazole (ABILIFY) 10 MG tablet Take 10 mg by mouth daily.   Yes Historical Provider, MD  divalproex (DEPAKOTE SPRINKLE) 125 MG capsule Take 375 mg by mouth 2 (two) times daily. 1200 and 1800 07/18/13  Yes Historical Provider, MD  levothyroxine (SYNTHROID, LEVOTHROID) 125 MCG tablet Take 125 mcg by mouth daily before breakfast.   Yes Historical Provider, MD  metoprolol tartrate (LOPRESSOR) 25 mg/10 mL SUSP Take 12.5 mg by mouth 2 (two) times daily.   Yes Historical  Provider, MD  pantoprazole sodium (PROTONIX) 40 mg/20 mL PACK 40 mg by PEG Tube route every morning.  11/30/11  Yes Alinda MoneyNovlet J Davis, MD  diphenhydrAMINE (BENADRYL) 25 mg capsule 25 mg by PEG Tube route every 6 (six) hours as needed (allergic reaction).     Historical Provider, MD  feeding supplement (PRO-STAT SUGAR FREE 64) LIQD 30 mLs by PEG Tube route 2 (two) times daily.     Historical Provider, MD  ferrous sulfate 220 (44 FE) MG/5ML solution 330 mg by PEG Tube route every morning.     Historical Provider, MD  ipratropium-albuterol (DUONEB) 0.5-2.5 (3) MG/3ML SOLN Take 3 mLs by nebulization every 6 (six) hours as needed (shortness of breath).     Historical Provider, MD  methylcellulose (ARTIFICIAL TEARS) 1 % ophthalmic solution Place 1 drop into both eyes 4 (four) times daily.      Historical Provider, MD  multivitamin/minerals (GOLDEN AGE) LIQD 5 mLs by PEG Tube route every morning.     Historical Provider, MD  Nutritional Supplements (FEEDING SUPPLEMENT, VITAL 1.5 CAL,) LIQD Place 1,000 mLs into feeding tube as needed (if <50% of meal eaten).    Historical Provider, MD  polyethylene glycol (MIRALAX / GLYCOLAX) packet 17 g by PEG Tube route 2 (two) times daily. Mix 17gm 4-8 ounces of  liquid    Historical Provider, MD  traZODone (DESYREL) 50 MG tablet Take 50 mg by mouth 3 (three) times daily as needed (anxiety, agitation, psychosis).  08/02/13   Historical Provider, MD   BP 110/78  Pulse 64  Temp(Src) 97.3 F (36.3 C) (Oral)  Resp 17  SpO2 98% Physical Exam  Nursing note and vitals reviewed. Constitutional: He is oriented to person, place, and time. He appears well-developed and well-nourished.  HENT:  Head: Normocephalic.  Eyes: EOM are normal.  Neck: Normal range of motion.  Pulmonary/Chest: Effort normal.  Abdominal: He exhibits no distension.  G tube broken in left upper quadrant.  Easily removed in its entirety  Musculoskeletal: Normal range of motion.  Neurological: He is alert  and oriented to person, place, and time.  Psychiatric: He has a normal mood and affect.    ED Course  Procedures (including critical care time)  Gastrostomy tube replacement Performed by: Lyanne CoAMPOS,Michie Molnar M Consent: Verbal consent obtained. Risks and benefits: risks, benefits and alternatives were discussed Required items: required blood products, implants, devices, and special equipment available Patient identity confirmed: hospital-assigned identification number Time out: Immediately prior to procedure a "time out" was called to verify the correct patient, procedure, equipment, support staff and site/side marked as required. Preparation: Patient was prepped and draped in the usual sterile fashion. Patient tolerance: Patient tolerated the procedure well with no immediate complications. Comments: 22 french Gastrostomy tube placed without difficulty   Labs Review Labs Reviewed - No data to display  Imaging Review Dg Abd 1 View  10/06/2014   CLINICAL DATA:  G-tube verification, 50 mL Omnipaque 300 injected.  EXAM: ABDOMEN - 1 VIEW  COMPARISON:  None.  FINDINGS: Contrast injection through G-tube demonstrating contrast within the stomach. There is no contrast extravasation.  IMPRESSION: Contrast injection through G-tube demonstrating contrast within the stomach. There is no contrast extravasation.   Electronically Signed   By: Sherian ReinWei-Chen  Lin M.D.   On: 10/06/2014 15:54     EKG Interpretation None      MDM   Final diagnoses:  Complication of gastrostomy tube    G-tube replaced without any difficulty.  X-ray demonstrates placement    Lyanne CoKevin M Anshi Jalloh, MD 10/06/14 [phone]

## 2014-10-06 NOTE — ED Notes (Signed)
[facility], came in today with a g-tube which is broken and needs to be replaced.

## 2014-10-06 NOTE — ED Notes (Signed)
Called for [facility] to pick up patient.

## 2014-10-17 ENCOUNTER — Non-Acute Institutional Stay (SKILLED_NURSING_FACILITY): Payer: Medicare Other | Admitting: Internal Medicine

## 2014-10-17 DIAGNOSIS — K5900 Constipation, unspecified: Secondary | ICD-10-CM

## 2014-10-17 DIAGNOSIS — E0590 Thyrotoxicosis, unspecified without thyrotoxic crisis or storm: Secondary | ICD-10-CM

## 2014-10-17 DIAGNOSIS — I255 Ischemic cardiomyopathy: Secondary | ICD-10-CM

## 2014-10-17 DIAGNOSIS — I2589 Other forms of chronic ischemic heart disease: Secondary | ICD-10-CM

## 2014-10-17 DIAGNOSIS — G931 Anoxic brain damage, not elsewhere classified: Secondary | ICD-10-CM

## 2014-10-20 NOTE — Progress Notes (Signed)
          PROGRESS NOTE  DATE: 10-17-14  FACILITY: [facility]  LEVEL OF CARE: SNF  Routine Visit  CHIEF COMPLAINT:  Manage hypothyroidism, cardiomyopathy and constipation  HISTORY OF PRESENT ILLNESS:  REASSESSMENT OF ONGOING PROBLEM(S):  HYPOTHYROIDISM: The hypothyroidism remains stable. No complications noted from the medications presently being used.  The staff deny constipation.  Last TSH 9.27 in 5/14, 6/14 TSH 5.04, in 9/14 TSH 5.45, in 11-14 TSH 9.64, in 1-15 TSH 11.1, in 5-15 TSH 7.36, in 6-15 TSH 6.31, in 9-15 TSH 10.5. Patient is a poor historian secondary to anoxic brain injury.  CONSTIPATION: The constipation remains stable. No complications from the medications presently being used. Staff deny ongoing constipation, abdominal pain, nausea or vomiting.  CARDIOMYOPATHY: The patient's cardiomyopathy remains stable. Staff deny increasing lower extremity swelling, shortness of breath, chest pain, palpitations, orthopnea or PNDs. No complications reported from the medications currently being used.  PAST MEDICAL HISTORY : Reviewed.  No changes.  [facility]  REVIEW OF SYSTEMS: Unobtainable secondary to anoxic brain injury  PHYSICAL EXAMINATION  VS: see VS sign section  GENERAL: no acute distress, obese body habitus NECK: supple, trachea midline, no neck masses, no thyroid tenderness, no thyromegaly RESPIRATORY: breathing is even & unlabored, BS CTAB CARDIAC: RRR, no murmur,no extra heart sounds, no edema GI: abdomen soft, normal BS, no masses, no tenderness, no hepatomegaly, no splenomegaly, has peg PSYCHIATRIC: the patient is lethargic, decreased affect & mood  LABS/RADIOLOGY: 9-15 Hb 14.1 8-15 Depakote level 26 5-15 platelets 141 otherwise CBC normal, CMP normal, HDL 31 otherwise fasting lipid panel normal, hemoglobin A1c 5.2, Depakote level 46 11-14 triglycerides 163, HDL 22 otherwise FLP normal, hemoglobin A1c 5.2, platelets 133 otherwise  CBC normal, CMP normal  8-14 Depakote level 11 5/14 fasting lipid panel normal, including A1c 4.9, CMP normal, platelets 139 otherwise CBC normal 2/14 Depakote 6 1/14 he will profile normal, platelets 121 otherwise CBC normal, BMP normal  ASSESSMENT/PLAN:  hypothyroidism-uncontrolled.  Levothyroxine was increased. Recheck TSH in 6 wks is pending. constipation-well-controlled. anoxic brain injury-continue supportive care.   Cardiomyopathy-well compensated. GERD-on protonix Insomnia-cont. trazodone Depression-paxil was started.  CPT CODE: [id number]  Stenerson PiggGayani Y. Kerry Doryasanayaka, MD [facility] [phone]

## 2014-11-08 IMAGING — CR DG ABDOMEN 1V
1 series · 1 of 1 positions shown · IV contrast (omnipaque)
Comparison: None.

CLINICAL DATA: G-tube verification, 50 mL Omnipaque 300 injected.

EXAM:
ABDOMEN - 1 VIEW

[x abdomen supine]
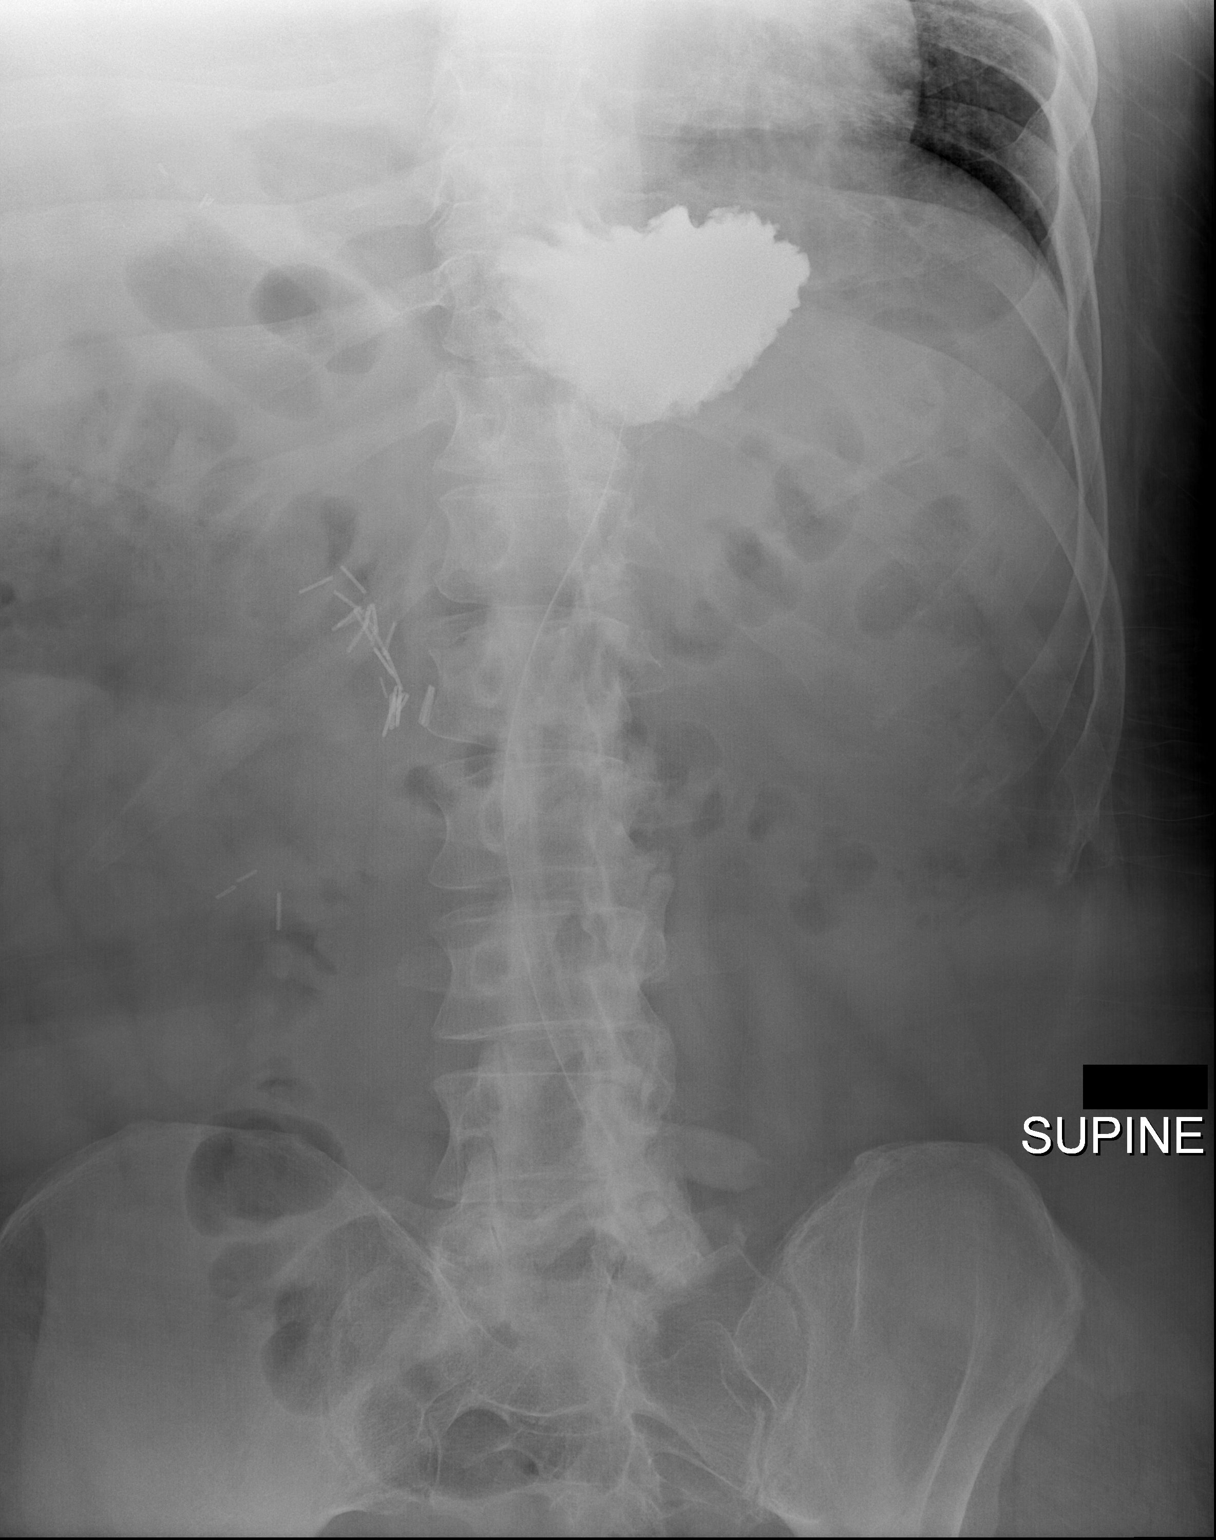

[1 of 1 positions shown; findings below may reference images not displayed]

FINDINGS: Contrast injection through G-tube demonstrating contrast within the
stomach. There is no contrast extravasation.
IMPRESSION: Contrast injection through G-tube demonstrating contrast within the
stomach. There is no contrast extravasation.

## 2015-01-30 ENCOUNTER — Emergency Department (HOSPITAL_COMMUNITY): Payer: Medicare Other

## 2015-01-30 ENCOUNTER — Encounter (HOSPITAL_COMMUNITY): Payer: Self-pay | Admitting: Emergency Medicine

## 2015-01-30 ENCOUNTER — Emergency Department (HOSPITAL_COMMUNITY)
Admission: EM | Admit: 2015-01-30 | Discharge: 2015-01-30 | Disposition: A | Discharge status: Home / Self Care | Payer: Medicare Other | Attending: Emergency Medicine | Admitting: Emergency Medicine

## 2015-01-30 DIAGNOSIS — Z79899 Other long term (current) drug therapy: Secondary | ICD-10-CM | POA: Insufficient documentation

## 2015-01-30 DIAGNOSIS — W01198A Fall on same level from slipping, tripping and stumbling with subsequent striking against other object, initial encounter: Secondary | ICD-10-CM | POA: Insufficient documentation

## 2015-01-30 DIAGNOSIS — S0083XA Contusion of other part of head, initial encounter: Secondary | ICD-10-CM

## 2015-01-30 DIAGNOSIS — Z87440 Personal history of urinary (tract) infections: Secondary | ICD-10-CM | POA: Diagnosis not present

## 2015-01-30 DIAGNOSIS — Y998 Other external cause status: Secondary | ICD-10-CM | POA: Insufficient documentation

## 2015-01-30 DIAGNOSIS — E039 Hypothyroidism, unspecified: Secondary | ICD-10-CM | POA: Diagnosis not present

## 2015-01-30 DIAGNOSIS — W19XXXA Unspecified fall, initial encounter: Secondary | ICD-10-CM

## 2015-01-30 DIAGNOSIS — S3991XA Unspecified injury of abdomen, initial encounter: Secondary | ICD-10-CM | POA: Diagnosis not present

## 2015-01-30 DIAGNOSIS — I252 Old myocardial infarction: Secondary | ICD-10-CM | POA: Diagnosis not present

## 2015-01-30 DIAGNOSIS — Y9389 Activity, other specified: Secondary | ICD-10-CM | POA: Diagnosis not present

## 2015-01-30 DIAGNOSIS — S0990XA Unspecified injury of head, initial encounter: Secondary | ICD-10-CM | POA: Diagnosis present

## 2015-01-30 DIAGNOSIS — K219 Gastro-esophageal reflux disease without esophagitis: Secondary | ICD-10-CM | POA: Insufficient documentation

## 2015-01-30 DIAGNOSIS — Y9289 Other specified places as the place of occurrence of the external cause: Secondary | ICD-10-CM | POA: Diagnosis not present

## 2015-01-30 DIAGNOSIS — G40909 Epilepsy, unspecified, not intractable, without status epilepticus: Secondary | ICD-10-CM | POA: Insufficient documentation

## 2015-01-30 DIAGNOSIS — Z91040 Latex allergy status: Secondary | ICD-10-CM | POA: Insufficient documentation

## 2015-01-30 DIAGNOSIS — H409 Unspecified glaucoma: Secondary | ICD-10-CM | POA: Diagnosis not present

## 2015-01-30 DIAGNOSIS — Z880 Allergy status to penicillin: Secondary | ICD-10-CM | POA: Insufficient documentation

## 2015-01-30 DIAGNOSIS — F419 Anxiety disorder, unspecified: Secondary | ICD-10-CM | POA: Diagnosis not present

## 2015-01-30 LAB — I-STAT CG4 LACTIC ACID, ED: Lactic Acid, Venous: 1.19 mmol/L (ref 0.5–2.0)

## 2015-01-30 LAB — COMPREHENSIVE METABOLIC PANEL WITH GFR
ALT: 11 U/L (ref 0–53)
AST: 19 U/L (ref 0–37)
Albumin: 3.8 g/dL (ref 3.5–5.2)
Alkaline Phosphatase: 52 U/L (ref 39–117)
Anion gap: 7 (ref 5–15)
BUN: 21 mg/dL (ref 6–23)
CO2: 26 mmol/L (ref 19–32)
Calcium: 8.9 mg/dL (ref 8.4–10.5)
Chloride: 106 mmol/L (ref 96–112)
Creatinine, Ser: 1.33 mg/dL (ref 0.50–1.35)
GFR calc Af Amer: 72 mL/min — ABNORMAL LOW
GFR calc non Af Amer: 62 mL/min — ABNORMAL LOW
Glucose, Bld: 78 mg/dL (ref 70–99)
Potassium: 4.1 mmol/L (ref 3.5–5.1)
Sodium: 139 mmol/L (ref 135–145)
Total Bilirubin: 0.4 mg/dL (ref 0.3–1.2)
Total Protein: 7.1 g/dL (ref 6.0–8.3)

## 2015-01-30 LAB — CBC WITH DIFFERENTIAL/PLATELET
Basophils Absolute: 0 10*3/uL (ref 0.0–0.1)
Basophils Relative: 0 % (ref 0–1)
EOS PCT: 1 % (ref 0–5)
Eosinophils Absolute: 0.1 10*3/uL (ref 0.0–0.7)
HCT: 41.5 % (ref 39.0–52.0)
Hemoglobin: 14.1 g/dL (ref 13.0–17.0)
LYMPHS ABS: 1.6 10*3/uL (ref 0.7–4.0)
Lymphocytes Relative: 20 % (ref 12–46)
MCH: 30.7 pg (ref 26.0–34.0)
MCHC: 34 g/dL (ref 30.0–36.0)
MCV: 90.2 fL (ref 78.0–100.0)
Monocytes Absolute: 0.8 10*3/uL (ref 0.1–1.0)
Monocytes Relative: 11 % (ref 3–12)
NEUTROS PCT: 68 % (ref 43–77)
Neutro Abs: 5.4 10*3/uL (ref 1.7–7.7)
PLATELETS: 143 10*3/uL — AB (ref 150–400)
RBC: 4.6 MIL/uL (ref 4.22–5.81)
RDW: 13.6 % (ref 11.5–15.5)
WBC: 7.9 10*3/uL (ref 4.0–10.5)

## 2015-01-30 LAB — URINALYSIS, ROUTINE W REFLEX MICROSCOPIC
BILIRUBIN URINE: NEGATIVE
Glucose, UA: NEGATIVE mg/dL
Hgb urine dipstick: NEGATIVE
KETONES UR: NEGATIVE mg/dL
LEUKOCYTES UA: NEGATIVE
Nitrite: NEGATIVE
PH: 5.5 (ref 5.0–8.0)
PROTEIN: NEGATIVE mg/dL
Specific Gravity, Urine: 1.027 (ref 1.005–1.030)
UROBILINOGEN UA: 0.2 mg/dL (ref 0.0–1.0)

## 2015-01-30 LAB — VALPROIC ACID LEVEL: Valproic Acid Lvl: 64.2 ug/mL (ref 50.0–100.0)

## 2015-01-30 NOTE — ED Notes (Signed)
Bed: WA03 Expected date:  Expected time:  Means of arrival:  Comments: Fall nursing home

## 2015-01-30 NOTE — ED Notes (Signed)
[facility] was called [facility]

## 2015-01-30 NOTE — ED Notes (Signed)
[facility]--- discharge instructions provided.

## 2015-01-30 NOTE — ED Notes (Signed)
[facility] witness first fall by patient and patient hit chin and patient was appropriate afterwards, ate and bathed. Rosa LPN, states does not know what happened for second injury on forehead patient was seen in wheelchair the whole time unwitnessed injury. Facility sending patient per family request because patient getting up more then normal.

## 2015-01-30 NOTE — ED Provider Notes (Signed)
CSN: [id number]     Arrival date & time 01/30/15  1011 History   First MD Initiated Contact with Patient 01/30/15 1020     Chief Complaint  Patient presents with  . Fall    Level V caveat due to anoxic brain injury (Consider location/radiation/quality/duration/timing/severity/associated sxs/prior Treatment) Patient is a 49 y.o. male presenting with fall. The history is provided by the patient and the nursing home.  Fall Associated symptoms include abdominal pain.   patient poorly had 2 falls in the nursing home. Reportedly had one fall where he fell and hit his chin. Then after that they found him with a second laceration on his head. Reportedly was another fall. Patient is complaining of pain in his abdomen. States he hit it on a chair with fall. He is difficult to understand somewhat poor historian.  Past Medical History  Diagnosis Date  . Addison disease   . Anoxic brain injury   . A-fib   . Seizure disorder   . Dysphagia   . History of recurrent UTIs   . Hyperlipemia   . Aspiration pneumonia   . Mental disorder   . Anxiety   . Reflux   . Dysphagia   . Glucocorticoid deficiency   . Atrial fibrillation   . Glaucoma   . Hypothyroidism   . Encephalopathy   . Quadriplegia   . Addison disease   . Myocardial infarction   . Pneumoperitoneum of unknown etiology 12/21/2011  . Chronic constipation 11/23/2011   Past Surgical History  Procedure Laterality Date  . Nephrectomy  unknown  . Peg tube placement     History reviewed. No pertinent family history. History  Substance Use Topics  . Smoking status: Never Smoker   . Smokeless tobacco: Never Used  . Alcohol Use: No    Review of Systems  Unable to perform ROS Gastrointestinal: Positive for abdominal pain.  Skin: Positive for wound.      Allergies  Ativan; Latex; Morphine and related; Penicillins; Pyridium; and Soy allergy  Home Medications   Prior to Admission medications   Medication Sig Start Date End Date  Taking? Authorizing Provider  ARIPiprazole (ABILIFY) 10 MG tablet Take 10 mg by mouth daily.   Yes Historical Provider, MD  diphenhydrAMINE (BENADRYL) 25 mg capsule 25 mg by PEG Tube route every 6 (six) hours as needed (allergic reaction).    Yes Historical Provider, MD  divalproex (DEPAKOTE SPRINKLE) 125 MG capsule Take 250-500 mg by mouth 3 (three) times daily. He takes two capsules at 12 noon and four capsules at 8am and 8pm. 07/18/13  Yes Historical Provider, MD  docusate (COLACE) 50 MG/5ML liquid Place 100 mg into feeding tube 2 (two) times daily.   Yes Historical Provider, MD  ipratropium-albuterol (DUONEB) 0.5-2.5 (3) MG/3ML SOLN Take 3 mLs by nebulization every 6 (six) hours as needed (shortness of breath).    Yes Historical Provider, MD  levothyroxine (SYNTHROID, LEVOTHROID) 125 MCG tablet Take 125 mcg by mouth daily before breakfast.   Yes Historical Provider, MD  loratadine (CLARITIN) 10 MG tablet Take 10 mg by mouth daily.   Yes Historical Provider, MD  metoprolol tartrate (LOPRESSOR) 25 mg/10 mL SUSP Take 12.5 mg by mouth 2 (two) times daily.   Yes Historical Provider, MD  multivitamin/minerals (GOLDEN AGE) LIQD 5 mLs by PEG Tube route every morning.    Yes Historical Provider, MD  Nutritional Supplements (VITAL 1.5 CAL PO) Take 237 mLs by mouth as needed (For less than 50% food consumption.).  Yes Historical Provider, MD  pantoprazole sodium (PROTONIX) 40 mg/20 mL PACK 40 mg by PEG Tube route daily at 12 noon.  11/30/11  Yes [name], MD  PARoxetine (PAXIL) 10 MG tablet Take 10 mg by mouth daily.   Yes Historical Provider, MD  polyethylene glycol (MIRALAX / GLYCOLAX) packet 17 g by PEG Tube route 2 (two) times daily. Mix 17gm 4-8 ounces of liquid   Yes Historical Provider, MD  polyvinyl alcohol (LIQUIFILM TEARS) 1.4 % ophthalmic solution Place 1 drop into both eyes 4 (four) times daily. Wait 3-5 minutes between two eye meds.   Yes Historical Provider, MD  traZODone (DESYREL) 50 MG  tablet Take 25 mg by mouth 2 (two) times daily.  08/02/13  Yes Historical Provider, MD  traZODone (DESYREL) 50 MG tablet Take 50 mg by mouth 3 (three) times daily as needed (anxiety).   Yes Historical Provider, MD   BP 95/62 mmHg  Pulse 77  Temp(Src) 98.1 F (36.7 C) (Oral)  Resp 16  SpO2 96% Physical Exam  Constitutional: He appears well-developed.  HENT:  Hematoma with somewhat superficial 2 cm laceration to right side of forehead.  Eyes: Pupils are equal, round, and reactive to light.  Neck: Normal range of motion. Neck supple.  Cardiovascular: Normal rate.   Pulmonary/Chest:  Equal breath sounds bilaterally. Mildly harsh breath sounds.  Abdominal: He exhibits no mass. There is tenderness.  Upper abdominal tenderness without rebound or guarding. Some fullness in the epigastric area. PEG tube left upper quadrant.  Musculoskeletal: He exhibits edema.  Some edema to bilateral lower extremities.  Neurological: He is alert.  Awake and difficult to understand. Reportedly moving his extremities at his baseline.  Skin: Skin is warm.    ED Course  Procedures (including critical care time) Labs Review Labs Reviewed  CBC WITH DIFFERENTIAL/PLATELET - Abnormal; Notable for the following:    Platelets 143 (*)    All other components within normal limits  COMPREHENSIVE METABOLIC PANEL - Abnormal; Notable for the following:    GFR calc non Af Amer 62 (*)    GFR calc Af Amer 72 (*)    All other components within normal limits  URINALYSIS, ROUTINE W REFLEX MICROSCOPIC  VALPROIC ACID LEVEL  I-STAT CG4 LACTIC ACID, ED    Imaging Review Dg Chest Portable 1 View  01/30/2015   CLINICAL DATA:  Fall. History of cardiomyopathy and anoxic brain injury. Initial encounter.  EXAM: PORTABLE CHEST - 1 VIEW  COMPARISON:  05/17/2014 and 08/31/2013.  FINDINGS: 1049 hr. Chronic low lung volumes with asymmetric elevation of the right hemidiaphragm. Associated bibasilar atelectasis is similar to the prior  examinations. There is no confluent airspace opacity, edema or significant pleural effusion. The heart size and mediastinal contours are stable.  IMPRESSION: Stable chronic low lung volumes, elevation of the right hemidiaphragm and bibasilar atelectasis. No new findings demonstrated.   Electronically Signed   By: Roxy Horseman M.D.   On: 01/30/2015 11:01     EKG Interpretation None      MDM   Final diagnoses:  Fall, initial encounter  Forehead contusion, initial encounter    Patient with fall. Forehead contusion does not appear to need CT scan at this time. He is at his baseline and will be monitored there also. Also some mild epigastric pain. Lab work reassuring. Doubt severe intra-abdominal trauma. Has apparently been a little more confused before the fall. Lab work and urine reassuring. Will discharge back to nursing home.  Juliet Rude. Rubin Payor, MD 01/30/15 [phone]

## 2015-01-30 NOTE — ED Notes (Signed)
Stretcher alarm put in place

## 2015-01-30 NOTE — Discharge Instructions (Signed)
Contusion °A contusion is a deep bruise. Contusions are the result of an injury that caused bleeding under the skin. The contusion may turn blue, purple, or yellow. Minor injuries will give you a painless contusion, but more severe contusions may stay painful and swollen for a few weeks.  °CAUSES  °A contusion is usually caused by a blow, trauma, or direct force to an area of the body. °SYMPTOMS  °· Swelling and redness of the injured area. °· Bruising of the injured area. °· Tenderness and soreness of the injured area. °· Pain. °DIAGNOSIS  °The diagnosis can be made by taking a history and physical exam. An X-ray, CT scan, or MRI may be needed to determine if there were any associated injuries, such as fractures. °TREATMENT  °Specific treatment will depend on what area of the body was injured. In general, the best treatment for a contusion is resting, icing, elevating, and applying cold compresses to the injured area. Over-the-counter medicines may also be recommended for pain control. Ask your caregiver what the best treatment is for your contusion. °HOME CARE INSTRUCTIONS  °· Put ice on the injured area. °¨ Put ice in a plastic bag. °¨ Place a towel between your skin and the bag. °¨ Leave the ice on for 15-20 minutes, 3-4 times a day, or as directed by your health care provider. °· Only take over-the-counter or prescription medicines for pain, discomfort, or fever as directed by your caregiver. Your caregiver may recommend avoiding anti-inflammatory medicines (aspirin, ibuprofen, and naproxen) for 48 hours because these medicines may increase bruising. °· Rest the injured area. °· If possible, elevate the injured area to reduce swelling. °SEEK IMMEDIATE MEDICAL CARE IF:  °· You have increased bruising or swelling. °· You have pain that is getting worse. °· Your swelling or pain is not relieved with medicines. °MAKE SURE YOU:  °· Understand these instructions. °· Will watch your condition. °· Will get help right  away if you are not doing well or get worse. °Document Released: 09/23/2005 Document Revised: 12/19/2013 Document Reviewed: 10/19/2011 °Exit[employer]® Patient Information ©2015 [employer]. This information is not intended to replace advice given to you by your health care provider. Make sure you discuss any questions you have with your health care provider. ° °

## 2015-03-04 IMAGING — CR DG CHEST 1V PORT
1 series · 1 of 1 positions shown · non-contrast
Comparison: 05/17/2014 and 08/31/2013.

CLINICAL DATA: Fall. History of cardiomyopathy and anoxic brain
injury. Initial encounter.

EXAM:
PORTABLE CHEST - 1 VIEW

[AP]
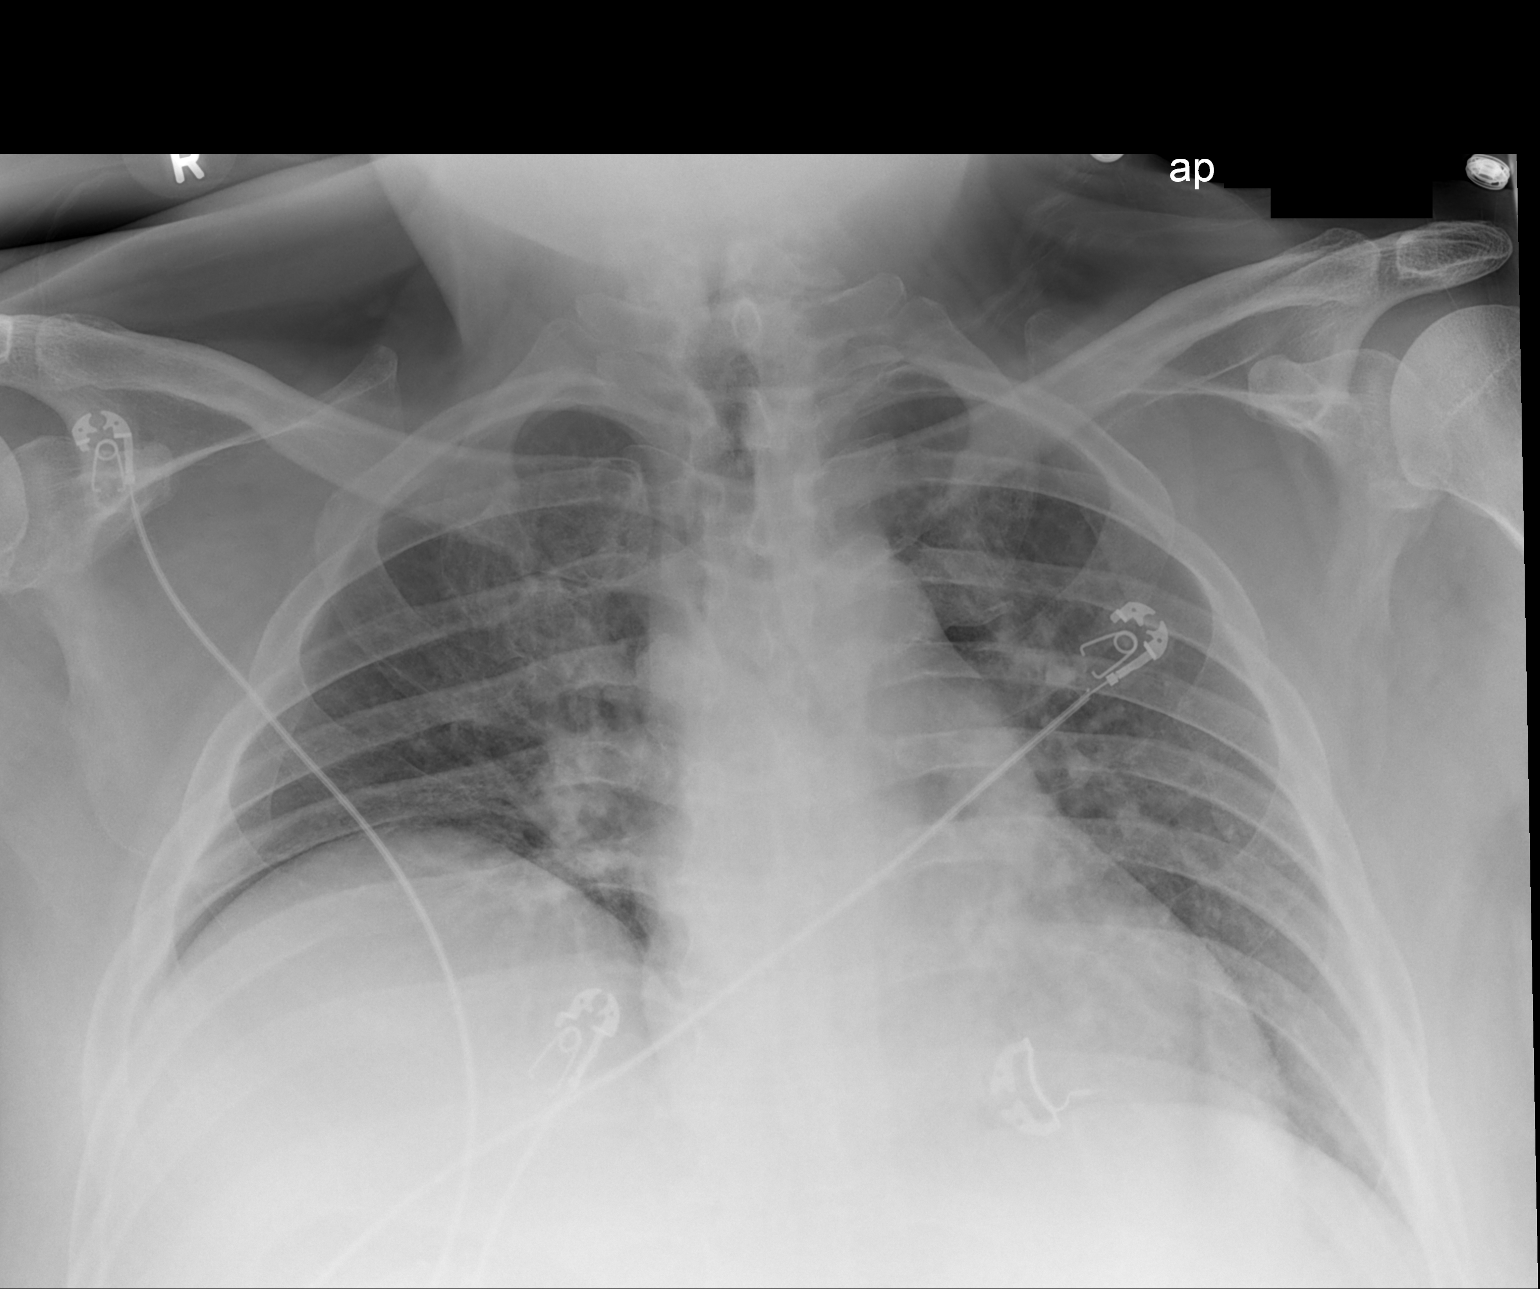

[1 of 1 positions shown; findings below may reference images not displayed]

FINDINGS: 8580 hr. Chronic low lung volumes with asymmetric elevation of the
right hemidiaphragm. Associated bibasilar atelectasis is similar to
the prior examinations. There is no confluent airspace opacity,
edema or significant pleural effusion. The heart size and
mediastinal contours are stable.
IMPRESSION: Stable chronic low lung volumes, elevation of the right
hemidiaphragm and bibasilar atelectasis. No new findings
demonstrated.

## 2015-04-24 ENCOUNTER — Emergency Department (HOSPITAL_COMMUNITY): Payer: Medicare Other

## 2015-04-24 ENCOUNTER — Encounter (HOSPITAL_COMMUNITY): Payer: Self-pay

## 2015-04-24 ENCOUNTER — Emergency Department (HOSPITAL_COMMUNITY)
Admission: EM | Admit: 2015-04-24 | Discharge: 2015-04-24 | Disposition: A | Discharge status: Home / Self Care | Payer: Medicare Other | Attending: Emergency Medicine | Admitting: Emergency Medicine

## 2015-04-24 DIAGNOSIS — K219 Gastro-esophageal reflux disease without esophagitis: Secondary | ICD-10-CM | POA: Insufficient documentation

## 2015-04-24 DIAGNOSIS — Z87440 Personal history of urinary (tract) infections: Secondary | ICD-10-CM | POA: Diagnosis not present

## 2015-04-24 DIAGNOSIS — Z880 Allergy status to penicillin: Secondary | ICD-10-CM | POA: Diagnosis not present

## 2015-04-24 DIAGNOSIS — Y998 Other external cause status: Secondary | ICD-10-CM | POA: Insufficient documentation

## 2015-04-24 DIAGNOSIS — Z79899 Other long term (current) drug therapy: Secondary | ICD-10-CM | POA: Diagnosis not present

## 2015-04-24 DIAGNOSIS — W228XXA Striking against or struck by other objects, initial encounter: Secondary | ICD-10-CM | POA: Diagnosis not present

## 2015-04-24 DIAGNOSIS — H409 Unspecified glaucoma: Secondary | ICD-10-CM | POA: Insufficient documentation

## 2015-04-24 DIAGNOSIS — Y9389 Activity, other specified: Secondary | ICD-10-CM | POA: Insufficient documentation

## 2015-04-24 DIAGNOSIS — Z8701 Personal history of pneumonia (recurrent): Secondary | ICD-10-CM | POA: Insufficient documentation

## 2015-04-24 DIAGNOSIS — I252 Old myocardial infarction: Secondary | ICD-10-CM | POA: Diagnosis not present

## 2015-04-24 DIAGNOSIS — W19XXXA Unspecified fall, initial encounter: Secondary | ICD-10-CM

## 2015-04-24 DIAGNOSIS — S01111A Laceration without foreign body of right eyelid and periocular area, initial encounter: Secondary | ICD-10-CM | POA: Diagnosis present

## 2015-04-24 DIAGNOSIS — Z8669 Personal history of other diseases of the nervous system and sense organs: Secondary | ICD-10-CM | POA: Diagnosis not present

## 2015-04-24 DIAGNOSIS — S022XXA Fracture of nasal bones, initial encounter for closed fracture: Secondary | ICD-10-CM | POA: Insufficient documentation

## 2015-04-24 DIAGNOSIS — E039 Hypothyroidism, unspecified: Secondary | ICD-10-CM | POA: Diagnosis not present

## 2015-04-24 DIAGNOSIS — Y9289 Other specified places as the place of occurrence of the external cause: Secondary | ICD-10-CM | POA: Diagnosis not present

## 2015-04-24 DIAGNOSIS — F419 Anxiety disorder, unspecified: Secondary | ICD-10-CM | POA: Insufficient documentation

## 2015-04-24 DIAGNOSIS — Z87820 Personal history of traumatic brain injury: Secondary | ICD-10-CM | POA: Insufficient documentation

## 2015-04-24 DIAGNOSIS — Z91040 Latex allergy status: Secondary | ICD-10-CM | POA: Insufficient documentation

## 2015-04-24 DIAGNOSIS — S0181XA Laceration without foreign body of other part of head, initial encounter: Secondary | ICD-10-CM

## 2015-04-24 MED ORDER — IBUPROFEN 200 MG PO TABS
600.0000 mg | ORAL_TABLET | Freq: Once | ORAL | Status: AC
  Administered 2015-04-24: 600 mg via ORAL
  Filled 2015-04-24: qty 3

## 2015-04-24 MED ORDER — LIDOCAINE-EPINEPHRINE 1 %-1:100000 IJ SOLN
INTRAMUSCULAR | Status: AC
  Administered 2015-04-24: 1 mL
  Filled 2015-04-24: qty 1

## 2015-04-24 NOTE — ED Notes (Signed)
Bed: [id number] Expected date: 04/24/15 Expected time: 3:18 AM Means of arrival: Ambulance Comments: EMS  Fall

## 2015-04-24 NOTE — ED Notes (Signed)
Updated Rose, at facility, on patient's condition.

## 2015-04-24 NOTE — ED Notes (Signed)
Gail at bedside, suturing laceration.

## 2015-04-24 NOTE — ED Notes (Signed)
Communications contacted to arrange [facility] transportation.

## 2015-04-24 NOTE — Discharge Instructions (Signed)
Facial Laceration A facial laceration is a cut on the face. These injuries can be painful and cause bleeding. Some cuts may need to be closed with stitches (sutures), skin adhesive strips, or wound glue. Cuts usually heal quickly but can leave a scar. It can take 1-2 years for the scar to go away completely. HOME CARE   Only take medicines as told by your doctor.  Follow your doctor's instructions for wound care. For Stitches:  Keep the cut clean and dry.  If you have a bandage (dressing), change it at least once a day. Change the bandage if it gets wet or dirty, or as told by your doctor.  Wash the cut with soap and water 2 times a day. Rinse the cut with water. Pat it dry with a clean towel.  Put a thin layer of medicated cream on the cut as told by your doctor.  You may shower after the first 24 hours. Do not soak the cut in water until the stitches are removed.  Have your stitches removed as told by your doctor.  Do not wear any makeup until a few days after your stitches are removed. For Skin Adhesive Strips:  Keep the cut clean and dry.  Do not get the strips wet. You may take a bath, but be careful to keep the cut dry.  If the cut gets wet, pat it dry with a clean towel.  The strips will fall off on their own. Do not remove the strips that are still stuck to the cut. For Wound Glue:  You may shower or take baths. Do not soak or scrub the cut. Do not swim. Avoid heavy sweating until the glue falls off on its own. After a shower or bath, pat the cut dry with a clean towel.  Do not put medicine or makeup on your cut until the glue falls off.  If you have a bandage, do not put tape over the glue.  Avoid lots of sunlight or tanning lamps until the glue falls off.  The glue will fall off on its own in 5-10 days. Do not pick at the glue. After Healing: Put sunscreen on the cut for the first year to reduce your scar. GET HELP RIGHT AWAY IF:   Your cut area gets red,  painful, or puffy (swollen).  You see a yellowish-white fluid (pus) coming from the cut.  You have chills or a fever. MAKE SURE YOU:   Understand these instructions.  Will watch your condition.  Will get help right away if you are not doing well or get worse. Document Released: 06/01/2008 Document Revised: 10/04/2013 Document Reviewed: 07/27/2013 [facility] Patient Information 2015 [employer] [place]. This information is not intended to replace advice given to you by your health care provider. Make sure you discuss any questions you have with your health care provider. Sutures should be removed in 5 days

## 2015-04-24 NOTE — ED Provider Notes (Signed)
CSN: [id number]     Arrival date & time 04/24/15  [id number] History   None    Chief Complaint  Patient presents with  . Facial Laceration     (Consider location/radiation/quality/duration/timing/severity/associated sxs/prior Treatment) HPI Comments: [name] is a 49 year old man.  He has had a traumatic brain injury.  Lives [facility] for the past several years.  He has a habit of rolling out of bed, which is been placed as close to the ground as possible.  Tonight he rolled out of bed hitting his face on the floor.  He now has a laceration to his right eyebrow.  A small abrasion/laceration across the bridge of his nose as well as a small ulceration over the left eye.  No loss of consciousness was reported.  Patient states he is having facial pain, no neck pain.  Denies injury to extremities  The history is provided by the EMS personnel.    Past Medical History  Diagnosis Date  . Addison disease   . Anoxic brain injury   . A-fib   . Seizure disorder   . Dysphagia   . History of recurrent UTIs   . Hyperlipemia   . Aspiration pneumonia   . Mental disorder   . Anxiety   . Reflux   . Dysphagia   . Glucocorticoid deficiency   . Atrial fibrillation   . Glaucoma   . Hypothyroidism   . Encephalopathy   . Quadriplegia   . Addison disease   . Myocardial infarction   . Pneumoperitoneum of unknown etiology 12/21/2011  . Chronic constipation 11/23/2011   Past Surgical History  Procedure Laterality Date  . Nephrectomy  unknown  . Peg tube placement     No family history on file. History  Substance Use Topics  . Smoking status: Never Smoker   . Smokeless tobacco: Never Used  . Alcohol Use: No    Review of Systems  Constitutional: Negative for fever.  Eyes: Negative for pain.  Skin: Positive for wound.  Neurological: Negative for dizziness and headaches.  All other systems reviewed and are negative.     Allergies  Ativan; Latex; Morphine and related;  Penicillins; Pyridium; and Soy allergy  Home Medications   Prior to Admission medications   Medication Sig Start Date End Date Taking? Authorizing Provider  divalproex (DEPAKOTE SPRINKLE) 125 MG capsule Take 250-500 mg by mouth 3 (three) times daily. He takes two capsules at 12 noon and four capsules at 8am and 8pm. 07/18/13  Yes Historical Provider, MD  docusate (COLACE) 50 MG/5ML liquid Place 100 mg into feeding tube 2 (two) times daily.   Yes Historical Provider, MD  levothyroxine (SYNTHROID, LEVOTHROID) 125 MCG tablet Take 125 mcg by mouth daily before breakfast.   Yes Historical Provider, MD  loratadine (CLARITIN) 10 MG tablet Take 10 mg by mouth daily.   Yes Historical Provider, MD  metoprolol tartrate (LOPRESSOR) 25 mg/10 mL SUSP Take 12.5 mg by mouth 2 (two) times daily.   Yes Historical Provider, MD  multivitamin/minerals (GOLDEN AGE) LIQD 5 mLs by PEG Tube route every morning.    Yes Historical Provider, MD  pantoprazole sodium (PROTONIX) 40 mg/20 mL PACK 40 mg by PEG Tube route daily at 12 noon.  11/30/11  Yes Alinda MoneyNovlet J Davis, MD  PARoxetine (PAXIL) 10 MG tablet 10 mg by PEG Tube route daily.    Yes Historical Provider, MD  polyethylene glycol (MIRALAX / GLYCOLAX) packet 17 g by PEG Tube route 2 (two)  times daily. Mix 17gm 4-8 ounces of liquid   Yes Historical Provider, MD  polyvinyl alcohol (LIQUIFILM TEARS) 1.4 % ophthalmic solution Place 1 drop into both eyes 4 (four) times daily. Wait 3-5 minutes between two eye meds.   Yes Historical Provider, MD  risperiDONE (RISPERDAL) 0.5 MG tablet Take 1 tablet by mouth 2 (two) times daily. 04/13/15  Yes Historical Provider, MD  traZODone (DESYREL) 50 MG tablet Take 25 mg by mouth 2 (two) times daily.  08/02/13  Yes Historical Provider, MD  traZODone (DESYREL) 50 MG tablet Take 50 mg by mouth 3 (three) times daily as needed (anxiety).   Yes Historical Provider, MD  haloperidol lactate (HALDOL) 5 MG/ML injection Inject 2.5 mg into the muscle every 8  (eight) hours as needed. agitation 02/18/15   Historical Provider, MD  ipratropium-albuterol (DUONEB) 0.5-2.5 (3) MG/3ML SOLN Take 3 mLs by nebulization every 6 (six) hours as needed (shortness of breath).     Historical Provider, MD  Nutritional Supplements (VITAL 1.5 CAL PO) Take 237 mLs by mouth as needed (For less than 50% food consumption.).    Historical Provider, MD   BP 132/76 mmHg  Pulse 80  Temp(Src) 98.8 F (37.1 C) (Oral)  Resp 18  SpO2 98% Physical Exam  Constitutional: He appears well-developed and well-nourished.  HENT:  Head: Normocephalic.    Right Ear: External ear normal.  Left Ear: External ear normal.  Mouth/Throat: Oropharynx is clear and moist.  Eyes: Pupils are equal, round, and reactive to light.  Neck: Normal range of motion.  Cardiovascular: Normal rate and regular rhythm.   Pulmonary/Chest: Effort normal and breath sounds normal.  Musculoskeletal: Normal range of motion. He exhibits no edema or tenderness.  Neurological: He is alert.  Skin: Skin is dry.  Nursing note and vitals reviewed.   ED Course  LACERATION REPAIR Date/Time: 04/24/2015 5:07 AM Performed by: Earley Favor Authorized by: Earley Favor Consent: Verbal consent obtained. Written consent not obtained. Risks and benefits: risks, benefits and alternatives were discussed Consent given by: patient Patient understanding: patient states understanding of the procedure being performed Patient identity confirmed: arm band Time out: Immediately prior to procedure a "time out" was called to verify the correct patient, procedure, equipment, support staff and site/side marked as required. Body area: head/neck Location details: right eyebrow Laceration length: 1 cm Foreign bodies: no foreign bodies Tendon involvement: none Nerve involvement: none Vascular damage: no Anesthesia: local infiltration Local anesthetic: lidocaine 1% with epinephrine Anesthetic total: 1 ml Patient sedated:  no Preparation: Patient was prepped and draped in the usual sterile fashion. Irrigation solution: saline Amount of cleaning: standard Debridement: none Degree of undermining: none Skin closure: 5-0 Prolene Number of sutures: 6 Technique: simple Approximation: close Approximation difficulty: simple Dressing: antibiotic ointment Patient tolerance: Patient tolerated the procedure well with no immediate complications Comments: Other 2 small lacerations did not require sutures.  At this time   (including critical care time) Labs Review Labs Reviewed - No data to display  Imaging Review Ct Head Wo Contrast  04/24/2015   CLINICAL DATA:  Rolled out of bed and landed on floor. Laceration above the right orbit, and left-sided facial swelling. Concern for head injury. Known history of traumatic brain injury. Initial encounter.  EXAM: CT HEAD WITHOUT CONTRAST  CT MAXILLOFACIAL WITHOUT CONTRAST  TECHNIQUE: Multidetector CT imaging of the head and maxillofacial structures were performed using the standard protocol without intravenous contrast. Multiplanar CT image reconstructions of the maxillofacial structures were also generated.  COMPARISON:  CT of the head and maxillofacial structures performed 05/17/2014  FINDINGS: CT HEAD FINDINGS  There is no evidence of acute infarction, mass lesion, or intra- or extra-axial hemorrhage on CT.  Prominence of the ventricles and sulci suggests mild cortical volume loss. Mild periventricular white matter change likely reflects small vessel ischemic microangiopathy. Mild cerebellar atrophy is noted.  The brainstem and fourth ventricle are within normal limits. The basal ganglia are unremarkable in appearance. The cerebral hemispheres demonstrate grossly normal gray-white differentiation. No mass effect or midline shift is seen.  There is no evidence of fracture; visualized osseous structures are unremarkable in appearance. The visualized portions of the orbits are within  normal limits. The paranasal sinuses and mastoid air cells are well-aerated. Mild soft tissue swelling is noted overlying the right orbit.  CT MAXILLOFACIAL FINDINGS  There is a depressed slightly comminuted fracture involving the right side of the nasal bone, with overlying soft tissue swelling. This is new from 2. The maxilla and mandible appear intact. The visualized dentition demonstrates no acute abnormality.  The orbits are intact bilaterally. Mild mucosal thickening is noted at the right maxillary sinus. Visualized paranasal sinuses and mastoid air cells are well-aerated.  Mild soft tissue swelling is noted overlying the right orbit. The parapharyngeal fat planes are preserved. The nasopharynx, oropharynx and hypopharynx are unremarkable in appearance. The visualized portions of the valleculae and piriform sinuses are grossly unremarkable.  The parotid and submandibular glands are within normal limits. No cervical lymphadenopathy is seen.  IMPRESSION: 1. No evidence of traumatic intracranial injury. 2. Depressed slightly comminuted fracture involving the right side of the nasal bone, with overlying soft tissue swelling, new from 2015. 3. Mild soft tissue swelling overlying the right orbit. 4. Mild cortical volume loss and scattered small vessel ischemic microangiopathy. 5. Mild mucosal thickening at the right maxillary sinus.   Electronically Signed   By: Roanna Raider M.D.   On: 04/24/2015 06:05   Ct Maxillofacial Wo Cm  04/24/2015   CLINICAL DATA:  Rolled out of bed and landed on floor. Laceration above the right orbit, and left-sided facial swelling. Concern for head injury. Known history of traumatic brain injury. Initial encounter.  EXAM: CT HEAD WITHOUT CONTRAST  CT MAXILLOFACIAL WITHOUT CONTRAST  TECHNIQUE: Multidetector CT imaging of the head and maxillofacial structures were performed using the standard protocol without intravenous contrast. Multiplanar CT image reconstructions of the  maxillofacial structures were also generated.  COMPARISON:  CT of the head and maxillofacial structures performed 05/17/2014  FINDINGS: CT HEAD FINDINGS  There is no evidence of acute infarction, mass lesion, or intra- or extra-axial hemorrhage on CT.  Prominence of the ventricles and sulci suggests mild cortical volume loss. Mild periventricular white matter change likely reflects small vessel ischemic microangiopathy. Mild cerebellar atrophy is noted.  The brainstem and fourth ventricle are within normal limits. The basal ganglia are unremarkable in appearance. The cerebral hemispheres demonstrate grossly normal gray-white differentiation. No mass effect or midline shift is seen.  There is no evidence of fracture; visualized osseous structures are unremarkable in appearance. The visualized portions of the orbits are within normal limits. The paranasal sinuses and mastoid air cells are well-aerated. Mild soft tissue swelling is noted overlying the right orbit.  CT MAXILLOFACIAL FINDINGS  There is a depressed slightly comminuted fracture involving the right side of the nasal bone, with overlying soft tissue swelling. This is new from 59. The maxilla and mandible appear intact. The visualized dentition demonstrates no acute abnormality.  The  orbits are intact bilaterally. Mild mucosal thickening is noted at the right maxillary sinus. Visualized paranasal sinuses and mastoid air cells are well-aerated.  Mild soft tissue swelling is noted overlying the right orbit. The parapharyngeal fat planes are preserved. The nasopharynx, oropharynx and hypopharynx are unremarkable in appearance. The visualized portions of the valleculae and piriform sinuses are grossly unremarkable.  The parotid and submandibular glands are within normal limits. No cervical lymphadenopathy is seen.  IMPRESSION: 1. No evidence of traumatic intracranial injury. 2. Depressed slightly comminuted fracture involving the right side of the nasal bone,  with overlying soft tissue swelling, new from 2015. 3. Mild soft tissue swelling overlying the right orbit. 4. Mild cortical volume loss and scattered small vessel ischemic microangiopathy. 5. Mild mucosal thickening at the right maxillary sinus.   Electronically Signed   By: Roanna Raider M.D.   On: 04/24/2015 06:05     EKG Interpretation None      MDM   Final diagnoses:  Fall  Closed displaced fracture of nasal bone, initial encounter  Facial laceration, initial encounter         Earley Favor, NP 04/24/15 [id number]  Cathren Laine, MD 04/28/15 [phone]

## 2015-04-24 NOTE — ED Notes (Signed)
Patient arrived to ED via EMS from [facility] after he rolled out of bed and landed on the floor.  Patient has history of TBI.  Staff reports he frequently rolls out of bed.  Bed at facility is lowered to ground for this reason.  Staff denies LOC.  Laceration noted above right eye, facial swelling noted to left side of face.

## 2015-05-27 IMAGING — CT CT MAXILLOFACIAL W/O CM
2 of 3 series · 15 of 30 positions shown, 18 images · non-contrast
Comparison: CT of the head and maxillofacial structures performed
05/17/2014

CLINICAL DATA: Rolled out of bed and landed on floor. Laceration
above the right orbit, and left-sided facial swelling. Concern for
head injury. Known history of traumatic brain injury. Initial
encounter.

EXAM:
CT HEAD WITHOUT CONTRAST
CT MAXILLOFACIAL WITHOUT CONTRAST
TECHNIQUE: Multidetector CT imaging of the head and maxillofacial structures
were performed using the standard protocol without intravenous
contrast. Multiplanar CT image reconstructions of the maxillofacial
structures were also generated.

[Series 3: facial st · axial · 0.35mm/px · z∈[+1244,+1382]mm · 12 of 83 slices shown, 15 images]
[im 7/83  brain]
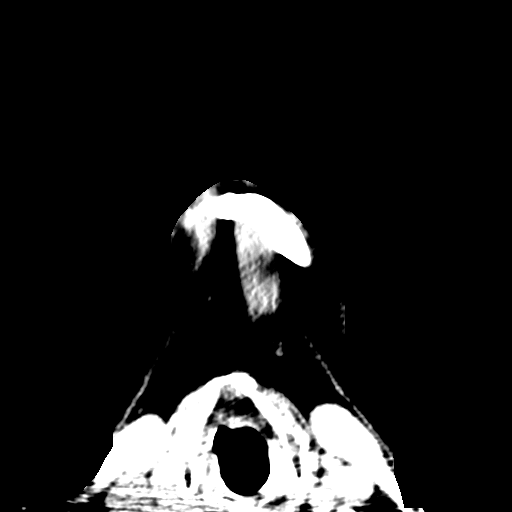
[im 7/83  bone]
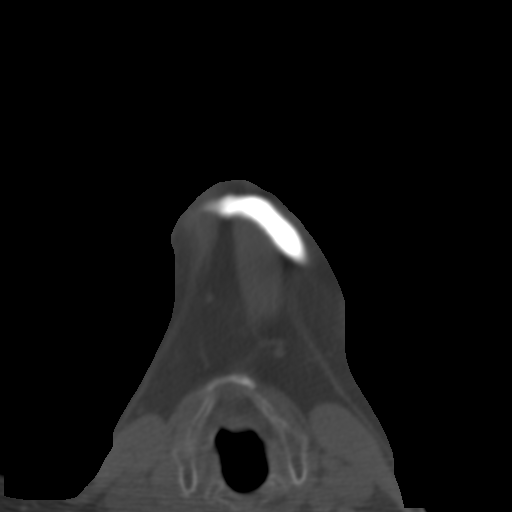
[im 13/83  bone]
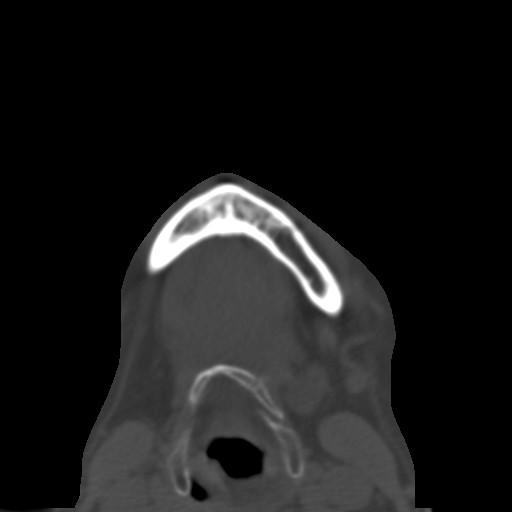
[im 19/83  bone]
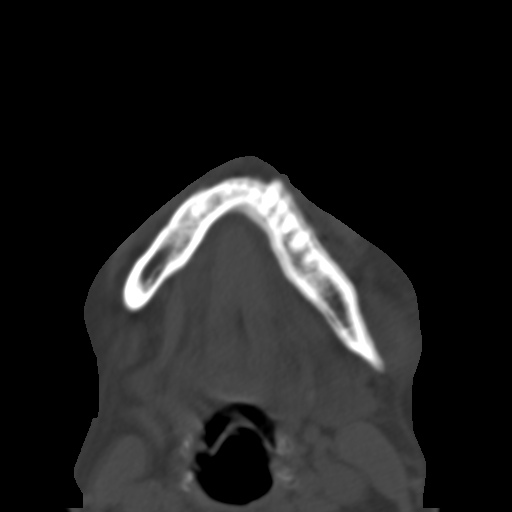
[im 26/83  bone]
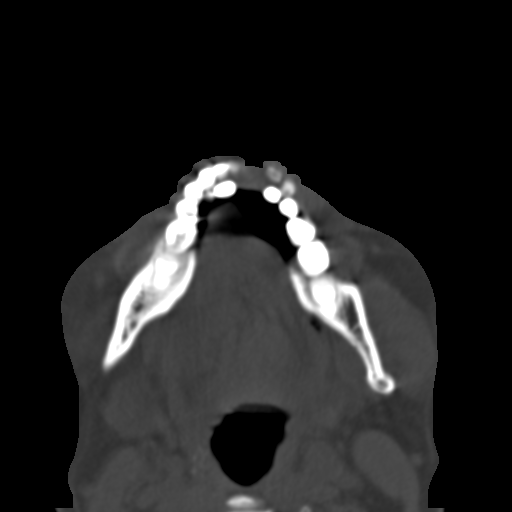
[im 32/83  brain]
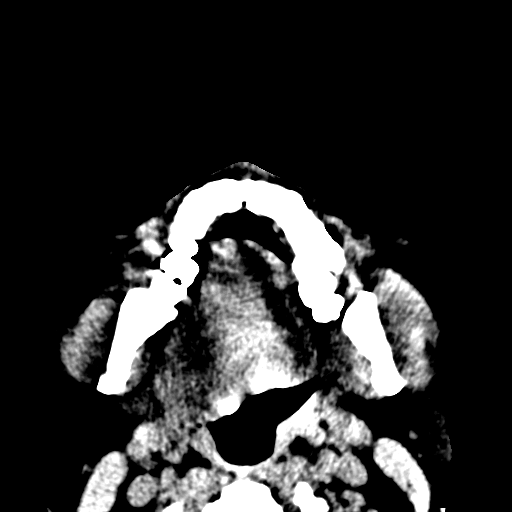
[im 32/83  bone]
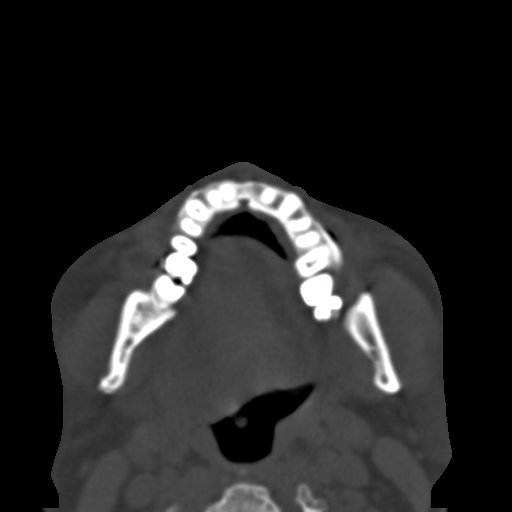
[im 38/83  bone]
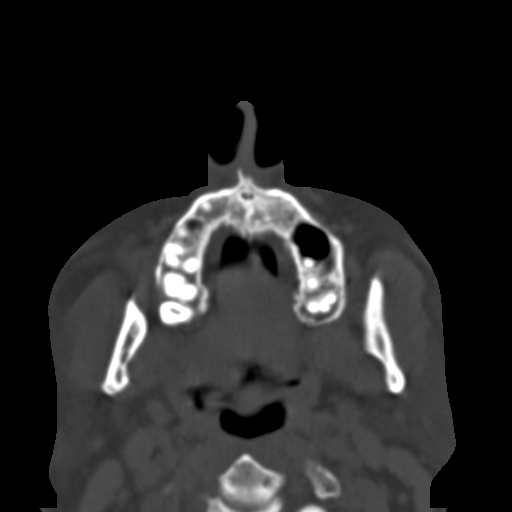
[im 45/83  bone]
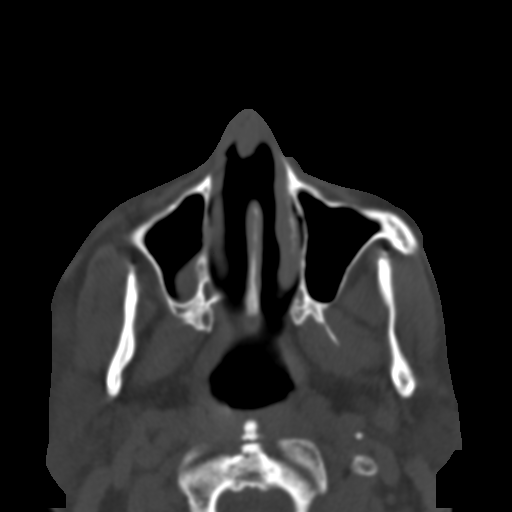
[im 51/83  bone]
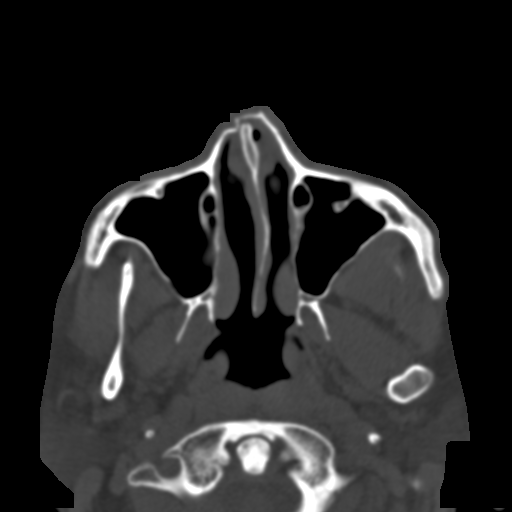
[im 57/83  brain]
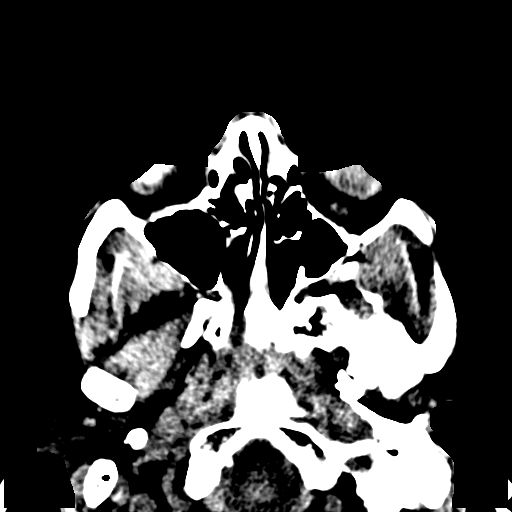
[im 57/83  bone]
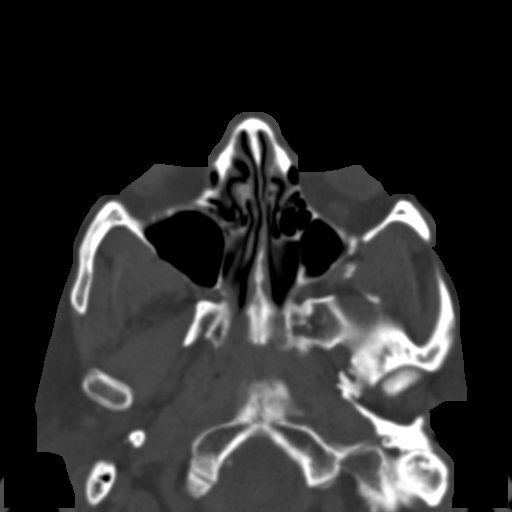
[im 64/83  bone]
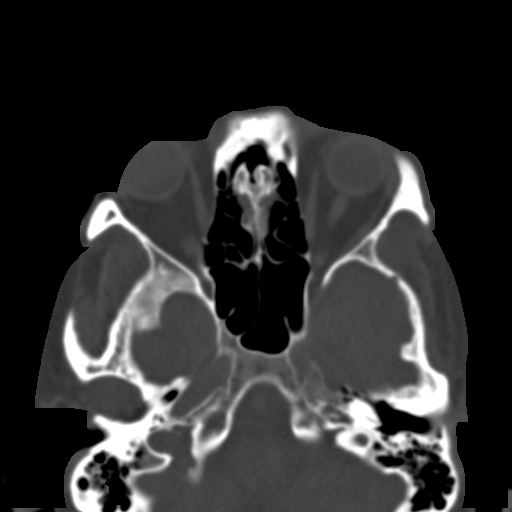
[im 70/83  bone]
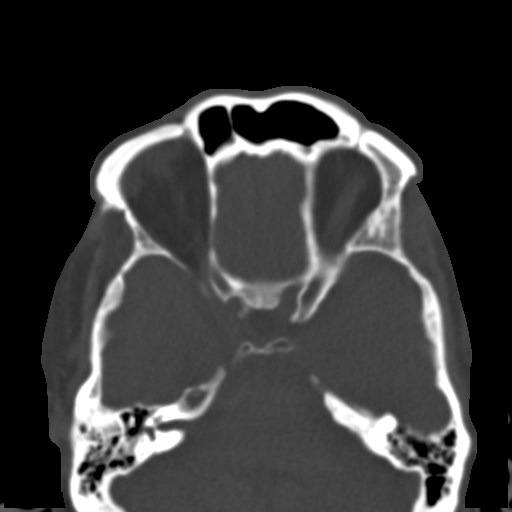
[im 76/83  bone]
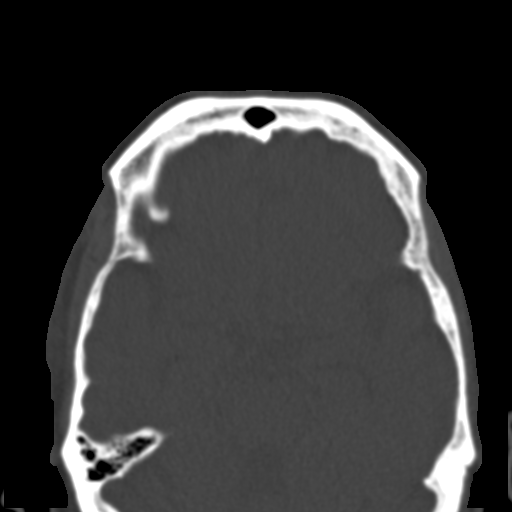

[Series 10: bone windows · axial · 0.46mm/px · z∈[+1352,+1388]mm · 3 of 57 slices shown]
[im 7/57  bone]
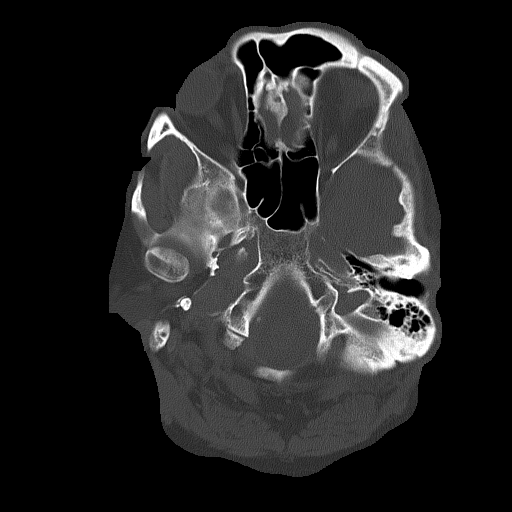
[im 13/57  bone]
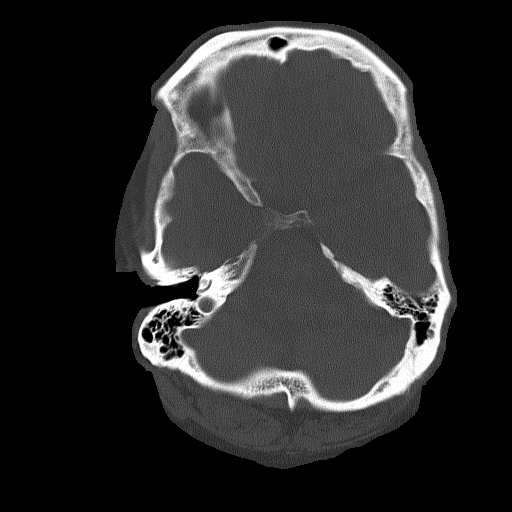
[im 19/57  bone]
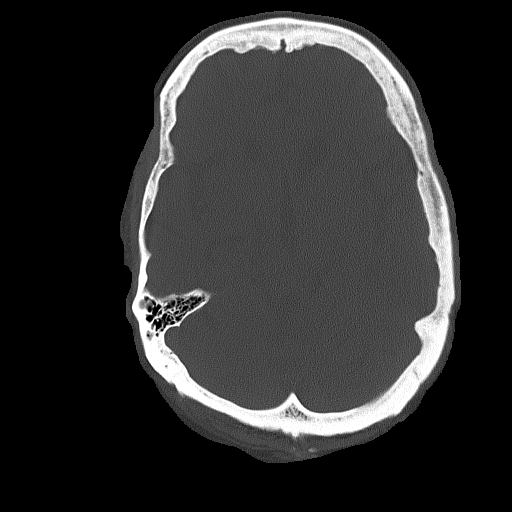

[15 of 30 positions shown; findings below may reference images not displayed]

FINDINGS: CT HEAD FINDINGS

There is no evidence of acute infarction, mass lesion, or intra- or
extra-axial hemorrhage on CT.

Prominence of the ventricles and sulci suggests mild cortical volume
loss. Mild periventricular white matter change likely reflects small
vessel ischemic microangiopathy. Mild cerebellar atrophy is noted.

The brainstem and fourth ventricle are within normal limits. The
basal ganglia are unremarkable in appearance. The cerebral
hemispheres demonstrate grossly normal gray-white differentiation.
No mass effect or midline shift is seen.

There is no evidence of fracture; visualized osseous structures are
unremarkable in appearance. The visualized portions of the orbits
are within normal limits. The paranasal sinuses and mastoid air
cells are well-aerated. Mild soft tissue swelling is noted overlying
the right orbit.

CT MAXILLOFACIAL FINDINGS

There is a depressed slightly comminuted fracture involving the
right side of the nasal bone, with overlying soft tissue swelling.
This is new from 9726. The maxilla and mandible appear intact. The
visualized dentition demonstrates no acute abnormality.

The orbits are intact bilaterally. Mild mucosal thickening is noted
at the right maxillary sinus. Visualized paranasal sinuses and
mastoid air cells are well-aerated.

Mild soft tissue swelling is noted overlying the right orbit. The
parapharyngeal fat planes are preserved. The nasopharynx, oropharynx
and hypopharynx are unremarkable in appearance. The visualized
portions of the valleculae and piriform sinuses are grossly
unremarkable.

The parotid and submandibular glands are within normal limits. No
cervical lymphadenopathy is seen.
IMPRESSION: 1. No evidence of traumatic intracranial injury.
2. Depressed slightly comminuted fracture involving the right side
of the nasal bone, with overlying soft tissue swelling, new from
9726.
3. Mild soft tissue swelling overlying the right orbit.
4. Mild cortical volume loss and scattered small vessel ischemic
microangiopathy.
5. Mild mucosal thickening at the right maxillary sinus.

## 2015-07-04 ENCOUNTER — Other Ambulatory Visit: Payer: Self-pay | Admitting: Radiology

## 2015-07-04 ENCOUNTER — Other Ambulatory Visit (HOSPITAL_COMMUNITY): Payer: Self-pay | Admitting: Internal Medicine

## 2015-07-04 DIAGNOSIS — R633 Feeding difficulties, unspecified: Secondary | ICD-10-CM

## 2015-07-04 DIAGNOSIS — Z79899 Other long term (current) drug therapy: Secondary | ICD-10-CM

## 2015-07-08 ENCOUNTER — Ambulatory Visit (HOSPITAL_COMMUNITY)
Admission: RE | Admit: 2015-07-08 | Discharge: 2015-07-08 | Disposition: A | Discharge status: Home / Self Care | Payer: Medicare Other | Source: Ambulatory Visit | Attending: Internal Medicine | Admitting: Internal Medicine

## 2015-07-08 DIAGNOSIS — Z431 Encounter for attention to gastrostomy: Secondary | ICD-10-CM | POA: Insufficient documentation

## 2015-07-08 DIAGNOSIS — Z79899 Other long term (current) drug therapy: Secondary | ICD-10-CM

## 2015-07-08 MED ORDER — IOHEXOL 300 MG/ML  SOLN
50.0000 mL | Freq: Once | INTRAMUSCULAR | Status: AC | PRN
  Administered 2015-07-08: 10 mL

## 2015-08-10 IMAGING — XA IR REPLACE G-TUBE/COLONIC TUBE
1 series · 2 of 2 positions shown · non-contrast
Comparison: none

CLINICAL DATA: Deteriorated percutaneous gastrostomy tube requiring
exchange.

[Series 1: run · 2 of 2 slices shown]
[im 1/2]
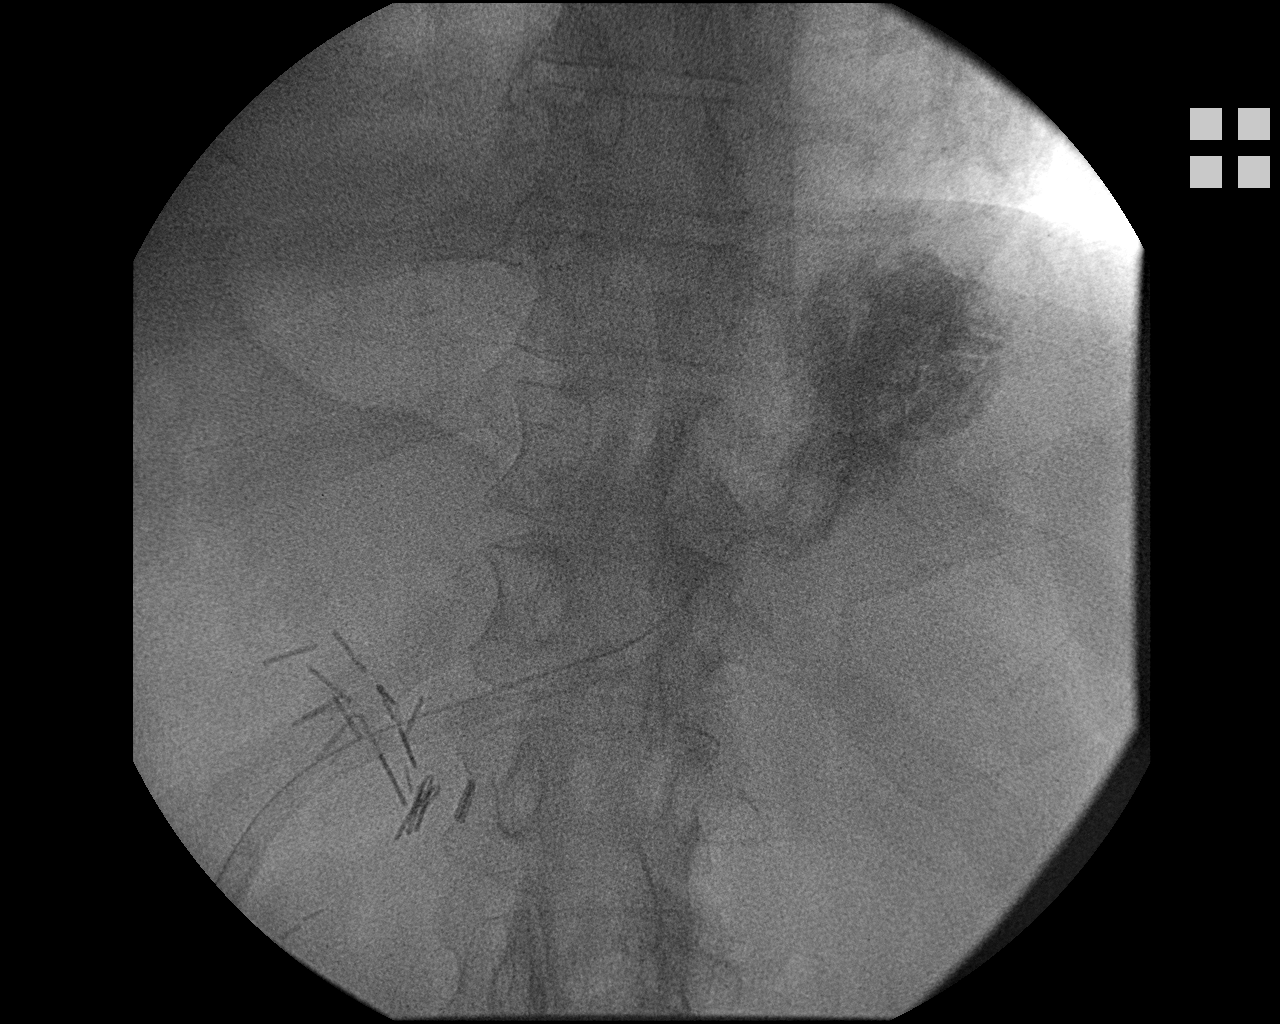
[im 2/2]
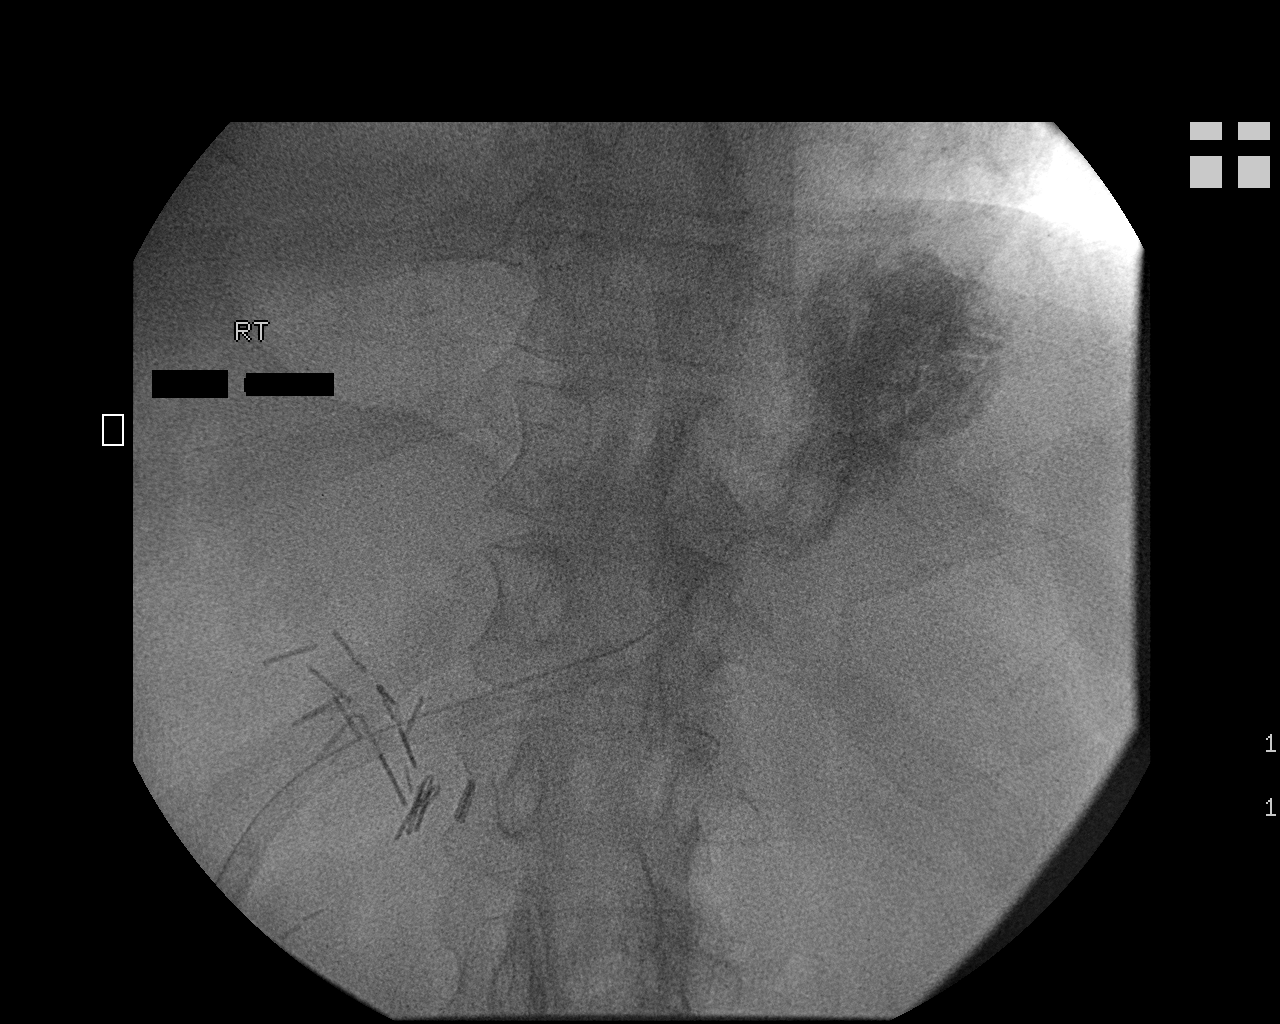

[2 of 2 positions shown; findings below may reference images not displayed]

EXAM:
GASTROSTOMY TUBE EXCHANGE

CONTRAST:  10 ml [id number]

FLUOROSCOPY TIME:  12 seconds.

PROCEDURE:
A pre-existing gastrostomy tube was removed after deflation of the
retention balloon. A new 22 French balloon retention catheter was
then advanced through the percutaneous tract. The retention balloon
was inflated with 8 mL of saline. The tube was injected with
contrast material and a fluoroscopic spot image obtained to confirm
position.

COMPLICATIONS:
None.
FINDINGS: After replacement, the tip of the new gastrostomy tube lies in the
body of the stomach.
IMPRESSION: Replacement of gastrostomy tube with new 22 French balloon retention
catheter. The catheter lies in the body of the stomach.

## 2015-10-09 ENCOUNTER — Emergency Department (HOSPITAL_COMMUNITY): Payer: Medicare Other

## 2015-10-09 ENCOUNTER — Emergency Department (HOSPITAL_COMMUNITY)
Admission: EM | Admit: 2015-10-09 | Discharge: 2015-10-09 | Disposition: A | Discharge status: Home / Self Care | Payer: Medicare Other | Attending: Emergency Medicine | Admitting: Emergency Medicine

## 2015-10-09 ENCOUNTER — Encounter (HOSPITAL_COMMUNITY): Payer: Self-pay | Admitting: Emergency Medicine

## 2015-10-09 DIAGNOSIS — S0083XA Contusion of other part of head, initial encounter: Secondary | ICD-10-CM | POA: Diagnosis not present

## 2015-10-09 DIAGNOSIS — Y9289 Other specified places as the place of occurrence of the external cause: Secondary | ICD-10-CM | POA: Insufficient documentation

## 2015-10-09 DIAGNOSIS — W19XXXA Unspecified fall, initial encounter: Secondary | ICD-10-CM

## 2015-10-09 DIAGNOSIS — K5900 Constipation, unspecified: Secondary | ICD-10-CM | POA: Diagnosis not present

## 2015-10-09 DIAGNOSIS — Z8701 Personal history of pneumonia (recurrent): Secondary | ICD-10-CM | POA: Insufficient documentation

## 2015-10-09 DIAGNOSIS — F419 Anxiety disorder, unspecified: Secondary | ICD-10-CM | POA: Diagnosis not present

## 2015-10-09 DIAGNOSIS — Z79899 Other long term (current) drug therapy: Secondary | ICD-10-CM | POA: Diagnosis not present

## 2015-10-09 DIAGNOSIS — S0990XA Unspecified injury of head, initial encounter: Secondary | ICD-10-CM | POA: Insufficient documentation

## 2015-10-09 DIAGNOSIS — Z91040 Latex allergy status: Secondary | ICD-10-CM | POA: Diagnosis not present

## 2015-10-09 DIAGNOSIS — H409 Unspecified glaucoma: Secondary | ICD-10-CM | POA: Diagnosis not present

## 2015-10-09 DIAGNOSIS — Z87440 Personal history of urinary (tract) infections: Secondary | ICD-10-CM | POA: Insufficient documentation

## 2015-10-09 DIAGNOSIS — Y9389 Activity, other specified: Secondary | ICD-10-CM | POA: Diagnosis not present

## 2015-10-09 DIAGNOSIS — Z880 Allergy status to penicillin: Secondary | ICD-10-CM | POA: Diagnosis not present

## 2015-10-09 DIAGNOSIS — K219 Gastro-esophageal reflux disease without esophagitis: Secondary | ICD-10-CM | POA: Insufficient documentation

## 2015-10-09 DIAGNOSIS — Y998 Other external cause status: Secondary | ICD-10-CM | POA: Insufficient documentation

## 2015-10-09 DIAGNOSIS — I252 Old myocardial infarction: Secondary | ICD-10-CM | POA: Diagnosis not present

## 2015-10-09 DIAGNOSIS — W06XXXA Fall from bed, initial encounter: Secondary | ICD-10-CM | POA: Diagnosis not present

## 2015-10-09 DIAGNOSIS — Z8639 Personal history of other endocrine, nutritional and metabolic disease: Secondary | ICD-10-CM | POA: Insufficient documentation

## 2015-10-09 DIAGNOSIS — S0993XA Unspecified injury of face, initial encounter: Secondary | ICD-10-CM | POA: Diagnosis present

## 2015-10-09 NOTE — ED Notes (Signed)
Patient returned from CT and xray.

## 2015-10-09 NOTE — ED Notes (Signed)
Report called to [facility].  [facility] called to transport patient back to [facility].

## 2015-10-09 NOTE — Discharge Instructions (Signed)
He did not have any serious injuries from her fall today. Please follow-up with her primary care doctor as needed. Take Tylenol and Motrin as needed for pain control. Return without fail for worsening symptoms, including worsening pain, vomiting unable to keep down food or fluids, confusion, or any other symptoms concerning to you.  Facial or Scalp Contusion A facial or scalp contusion is a deep bruise on the face or head. Injuries to the face and head generally cause a lot of swelling, especially around the eyes. Contusions are the result of an injury that caused bleeding under the skin. The contusion may turn blue, purple, or yellow. Minor injuries will give you a painless contusion, but more severe contusions may stay painful and swollen for a few weeks.  CAUSES  A facial or scalp contusion is caused by a blunt injury or trauma to the face or head area.  SIGNS AND SYMPTOMS   Swelling of the injured area.   Discoloration of the injured area.   Tenderness, soreness, or pain in the injured area.  DIAGNOSIS  The diagnosis can be made by taking a medical history and doing a physical exam. An X-ray exam, CT scan, or MRI may be needed to determine if there are any associated injuries, such as broken bones (fractures). TREATMENT  Often, the best treatment for a facial or scalp contusion is applying cold compresses to the injured area. Over-the-counter medicines may also be recommended for pain control.  HOME CARE INSTRUCTIONS   Only take over-the-counter or prescription medicines as directed by your health care provider.   Apply ice to the injured area.   Put ice in a plastic bag.   Place a towel between your skin and the bag.   Leave the ice on for 20 minutes, 2-3 times a day.  SEEK MEDICAL CARE IF:  You have bite problems.   You have pain with chewing.   You are concerned about facial defects. SEEK IMMEDIATE MEDICAL CARE IF:  You have severe pain or a headache that is not  relieved by medicine.   You have unusual sleepiness, confusion, or personality changes.   You throw up (vomit).   You have a persistent nosebleed.   You have double vision or blurred vision.   You have fluid drainage from your nose or ear.   You have difficulty walking or using your arms or legs.  MAKE SURE YOU:   Understand these instructions.  Will watch your condition.  Will get help right away if you are not doing well or get worse.   This information is not intended to replace advice given to you by your health care provider. Make sure you discuss any questions you have with your health care provider.   Document Released: 01/21/2005 Document Revised: 01/04/2015 Document Reviewed: 07/27/2013 [employer] Interactive Patient Education [employer]2016 [employer]

## 2015-10-09 NOTE — ED Provider Notes (Signed)
CSN: [id number]     Arrival date & time 10/09/15  2132 History   First MD Initiated Contact with Patient 10/09/15 2147     Chief Complaint  Patient presents with  . Fall     (Consider location/radiation/quality/duration/timing/severity/associated sxs/prior Treatment) HPI   49 year old male who presents after fall. He has a history of anoxic brain injury with subsequent cognitive impairment. [facility]. Is normally wheelchair-bound, was found in his wheelchair today with evidence of facial trauma. He had told staff members that he had fallen from his bed this morning, but follows unwitnessed. He states that he is having headache on the top of his head as well as right sided facial pain where he has a bruise and hematoma. States that he had rolled over out of bed onto the floor and thinks he may have lost consciousness. Also complains of right knee pain. Denies chest pain, difficulty breathing, neck pain, back pain, abdominal pain, nausea, vomiting, vision or speech changes, numbness or weakness. Denies recent illness, fever, chills, cough, diarrhea, dysuria, frequency.   Past Medical History  Diagnosis Date  . Addison disease (HCC)   . Anoxic brain injury (HCC)   . A-fib (HCC)   . Seizure disorder (HCC)   . Dysphagia   . History of recurrent UTIs   . Hyperlipemia   . Aspiration pneumonia (HCC)   . Mental disorder   . Anxiety   . Reflux   . Dysphagia   . Glucocorticoid deficiency (HCC)   . Atrial fibrillation (HCC)   . Glaucoma   . Hypothyroidism   . Encephalopathy   . Quadriplegia (HCC)   . Addison disease (HCC)   . Myocardial infarction (HCC)   . Pneumoperitoneum of unknown etiology 12/21/2011  . Chronic constipation 11/23/2011   Past Surgical History  Procedure Laterality Date  . Nephrectomy  unknown  . Peg tube placement     History reviewed. No pertinent family history. Social History  Substance Use Topics  . Smoking status: Never Smoker   .  Smokeless tobacco: Never Used  . Alcohol Use: No    Review of Systems 10/14 systems reviewed and are negative other than those stated in the HPI   Allergies  Ativan; Latex; Morphine and related; Penicillins; Pyridium; and Soy allergy  Home Medications   Prior to Admission medications   Medication Sig Start Date End Date Taking? Authorizing Provider  divalproex (DEPAKOTE SPRINKLE) 125 MG capsule Take 250-500 mg by mouth 3 (three) times daily. Take 4 capsules (500 mg) via peg tube twice daily (9am and 9pm) and 2 capsules (250 mg) via peg tube daily at noon 07/18/13  Yes Historical Provider, MD  levothyroxine (SYNTHROID, LEVOTHROID) 125 MCG tablet 125 mcg by PEG Tube route daily before breakfast.    Yes Historical Provider, MD  loratadine (CLARITIN) 10 MG tablet 10 mg by PEG Tube route daily.    Yes Historical Provider, MD  METOPROLOL SUCCINATE ER PO 12.5 mg by PEG Tube route 2 (two) times daily. Metoprolol tartrate 6.25 mg/5 ml suspension   Yes Historical Provider, MD  Multiple Vitamins-Minerals (CERTAVITE/ANTIOXIDANTS) LIQD 5 mLs by PEG Tube route daily at 12 noon.   Yes Historical Provider, MD  pantoprazole sodium (PROTONIX) 40 mg/20 mL PACK 40 mg by PEG Tube route daily at 12 noon.  11/30/11  Yes Alinda MoneyNovlet J Davis, MD  PARoxetine (PAXIL) 10 MG tablet 10 mg by PEG Tube route daily.    Yes Historical Provider, MD  polyethylene glycol (MIRALAX /  GLYCOLAX) packet 17 g by PEG Tube route 2 (two) times daily. Mix 17gm  In 4-8 ounces of liquid   Yes Historical Provider, MD  polyvinyl alcohol (LIQUIFILM TEARS) 1.4 % ophthalmic solution Place 1 drop into both eyes 4 (four) times daily. 9am, 2pm, 6pm, 9pm   Yes Historical Provider, MD  risperiDONE (RISPERDAL) 0.5 MG tablet 0.5 mg by PEG Tube route 2 (two) times daily.  04/13/15  Yes Historical Provider, MD  sennosides (SENOKOT) 8.8 MG/5ML syrup Take 15 mLs by mouth 2 (two) times daily.   Yes Historical Provider, MD  traZODone (DESYREL) 50 MG tablet Take  25-50 mg by mouth See admin instructions. Take 1/2 tablet (25 mg) by mouth twice daily at 8am and 2pm, may also take 1 tablet (50 mg) 3 additional times daily as needed for anxiety 08/02/13  Yes Historical Provider, MD  ipratropium-albuterol (DUONEB) 0.5-2.5 (3) MG/3ML SOLN Take 3 mLs by nebulization every 6 (six) hours as needed (shortness of breath).     Historical Provider, MD  Nutritional Supplements (VITAL 1.5 CAL PO) Take 237 mLs by mouth as needed (For less than 50% food consumption.).    Historical Provider, MD   BP 121/78 mmHg  Pulse 74  Temp(Src) 98.8 F (37.1 C) (Oral)  Resp 20  SpO2 95% Physical Exam Physical Exam  Nursing note and vitals reviewed. Constitutional: Well developed, well nourished, non-toxic, and in no acute distress Head: Normocephalic. Right maxillary hematoma.  Mouth/Throat: Oropharynx is clear and moist.  Neck: Normal range of motion. Neck supple. No cervical spine tenderness Cardiovascular: Normal rate and regular rhythm.  +2 radial pulses bilaterally Pulmonary/Chest: Effort normal and breath sounds normal.  Abdominal: Soft. There is no tenderness. There is no rebound and no guarding.  Musculoskeletal: Normal range of motion. Abrasion over right knee with mild soft tissue swelling. No TLS spine tenderness. Neurological: Alert, no facial droop, moves all extremities symmetrically Skin: Skin is warm and dry.  Psychiatric: Cooperative  ED Course  Procedures (including critical care time) Labs Review Labs Reviewed - No data to display  Imaging Review Ct Head Wo Contrast  10/09/2015  CLINICAL DATA:  49 year old male with acute headache, loss consciousness, facial pain and swelling following fall. Initial encounter. EXAM: CT HEAD WITHOUT CONTRAST CT MAXILLOFACIAL WITHOUT CONTRAST TECHNIQUE: Multidetector CT imaging of the head and maxillofacial structures were performed using the standard protocol without intravenous contrast. Multiplanar CT image  reconstructions of the maxillofacial structures were also generated. COMPARISON:  04/24/2015 and prior CTs FINDINGS: CT HEAD FINDINGS Mild chronic small-vessel white matter ischemic changes and mild atrophy identified. No acute intracranial abnormalities are identified, including mass lesion or mass effect, hydrocephalus, extra-axial fluid collection, midline shift, hemorrhage, or acute infarction. The visualized bony calvarium is unremarkable. CT MAXILLOFACIAL FINDINGS Right facial soft tissue swelling is noted. A remote right nasal bone fracture is again identified. There is no evidence of acute fracture, subluxation or dislocation. The paranasal sinuses, mastoid air cells and middle/ inner ears are clear. Orbits and globes are unremarkable. There is no evidence of intraconal or postseptal abnormality. IMPRESSION: No evidence of acute intracranial abnormality. Mild atrophy and chronic small-vessel white matter ischemic changes. Right facial soft tissue swelling without acute facial fracture. Remote right nasal fracture. Electronically Signed   By: Harmon Pier M.D.   On: 10/09/2015 22:57   Dg Knee Complete 4 Views Right  10/09/2015  CLINICAL DATA:  Larey Seat last night.  Right knee pain. EXAM: RIGHT KNEE - COMPLETE 4+ VIEW COMPARISON:  None. FINDINGS: Degenerative changes in the right knee with medial greater than lateral compartment narrowing and small osteophytes in the medial and lateral compartment. No evidence of acute fracture or dislocation in the right knee. No focal bone lesion or bone destruction. Bone cortex appears intact. No significant effusion. Soft tissues are unremarkable. IMPRESSION: Mild degenerative changes in the right knee. No acute bony abnormalities. Electronically Signed   By: Burman Nieves M.D.   On: 10/09/2015 22:28   Ct Maxillofacial Wo Cm  10/09/2015  CLINICAL DATA:  49 year old male with acute headache, loss consciousness, facial pain and swelling following fall. Initial  encounter. EXAM: CT HEAD WITHOUT CONTRAST CT MAXILLOFACIAL WITHOUT CONTRAST TECHNIQUE: Multidetector CT imaging of the head and maxillofacial structures were performed using the standard protocol without intravenous contrast. Multiplanar CT image reconstructions of the maxillofacial structures were also generated. COMPARISON:  04/24/2015 and prior CTs FINDINGS: CT HEAD FINDINGS Mild chronic small-vessel white matter ischemic changes and mild atrophy identified. No acute intracranial abnormalities are identified, including mass lesion or mass effect, hydrocephalus, extra-axial fluid collection, midline shift, hemorrhage, or acute infarction. The visualized bony calvarium is unremarkable. CT MAXILLOFACIAL FINDINGS Right facial soft tissue swelling is noted. A remote right nasal bone fracture is again identified. There is no evidence of acute fracture, subluxation or dislocation. The paranasal sinuses, mastoid air cells and middle/ inner ears are clear. Orbits and globes are unremarkable. There is no evidence of intraconal or postseptal abnormality. IMPRESSION: No evidence of acute intracranial abnormality. Mild atrophy and chronic small-vessel white matter ischemic changes. Right facial soft tissue swelling without acute facial fracture. Remote right nasal fracture. Electronically Signed   By: Harmon Pier M.D.   On: 10/09/2015 22:57   I have personally reviewed and evaluated these images and lab results as part of my medical decision-making.   EKG Interpretation   Date/Time:  Wednesday October 09 2015 21:57:14 EDT Ventricular Rate:  81 PR Interval:  186 QRS Duration: 90 QT Interval:  356 QTC Calculation: 413 R Axis:   18 Text Interpretation:  Sinus rhythm Low voltage, precordial leads No  significant change since last tracing Confirmed by Kaidance Pantoja MD, Annabelle Harman [phone]) on  10/09/2015 10:00:15 PM      MDM   Final diagnoses:  Fall, initial encounter  Facial hematoma, initial encounter    49 year old  male with history of anoxic brain injury and cognitive impairment who presents from [facility] with fall. Vital signs are stable and he seems to be behaving appropriately. Evidence of hematoma over the right maxilla as well as abrasions and swelling over the right knee. No other major evidence of trauma noted. The patient is a poor historian overall, so we will perform CT head and face to rule out significant trauma. X-rays of his right knee will also be performed. Imaging studies visualized and reviewed with radiology. No evidence of serious traumatic injury. Discussed supportive care with him and he is felt appropriate to be discharged back to his facility. Strict return instructions are also reviewed. He expressed understanding of all discharge instructions for comfortable to plan of care.    Lavera Guise, MD 10/10/15 [phone]

## 2015-10-09 NOTE — ED Notes (Signed)
Patient is a resident of [facility].  Patient was found in his wheelchair with hematoma under right eye.  Patient stated that he fell out of bed earlier today to staff.  Patient is ambulatory on occasion per staff.  Patient has history of anoxic brain injury.  Patient denies any chest pain or shortness of breath.  Patient does have pain to face and left thumb, where there is some blood on thumb.  Nail on left thumb is flapped up.

## 2015-10-09 NOTE — ED Notes (Signed)
[facility] contacted to return patient to [facility]

## 2015-10-09 NOTE — ED Notes (Signed)
Patient to xray and CT.

## 2015-11-11 IMAGING — DX DG KNEE COMPLETE 4+V*R*
4 series · 4 of 4 positions shown · non-contrast
Comparison: None.

CLINICAL DATA: Fell last night.  Right knee pain.

EXAM:
RIGHT KNEE - COMPLETE 4+ VIEW

[knee ap]
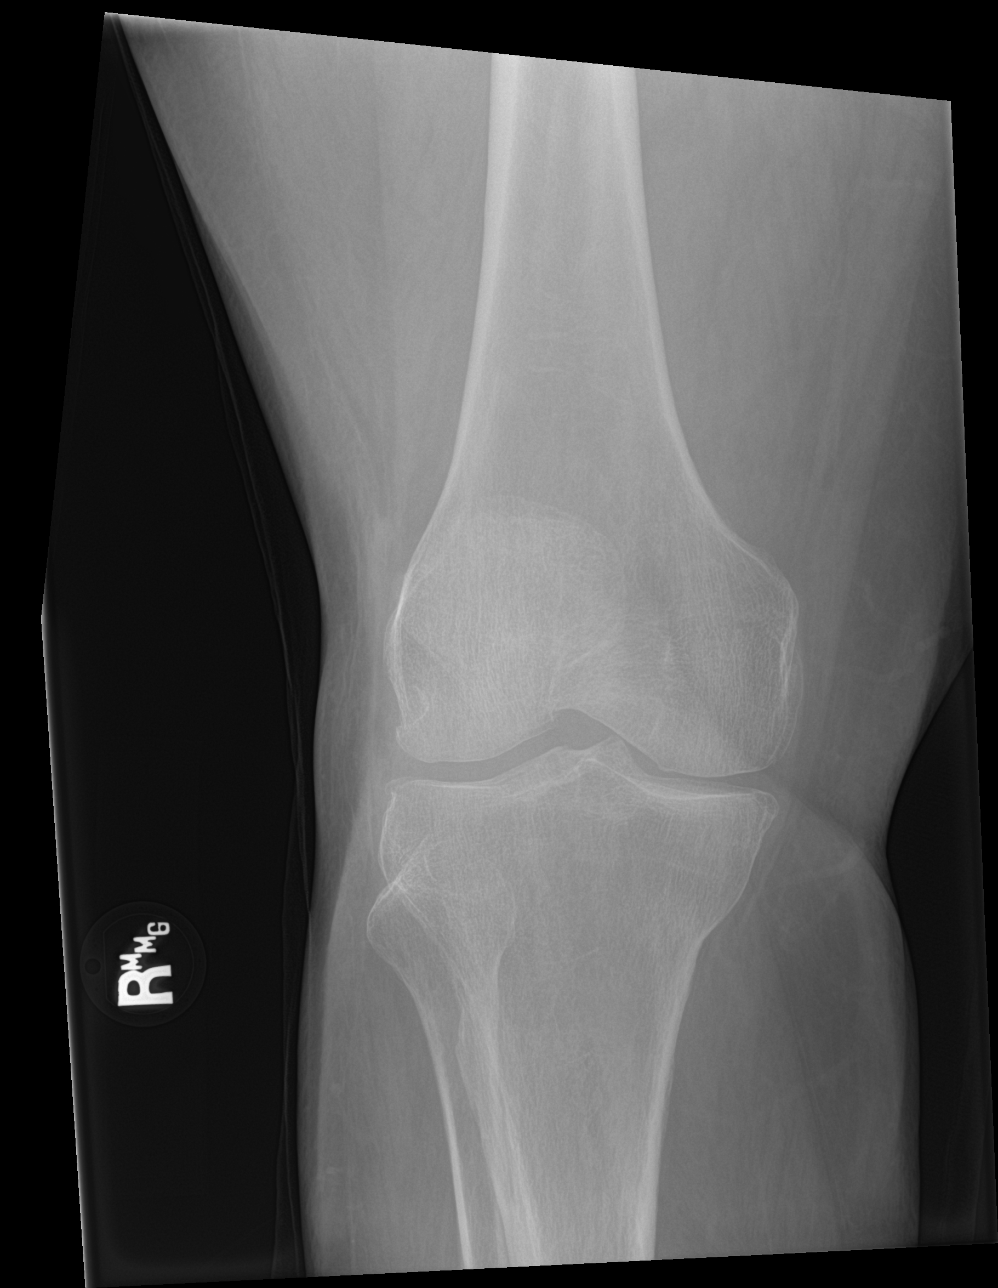

[knee lat]
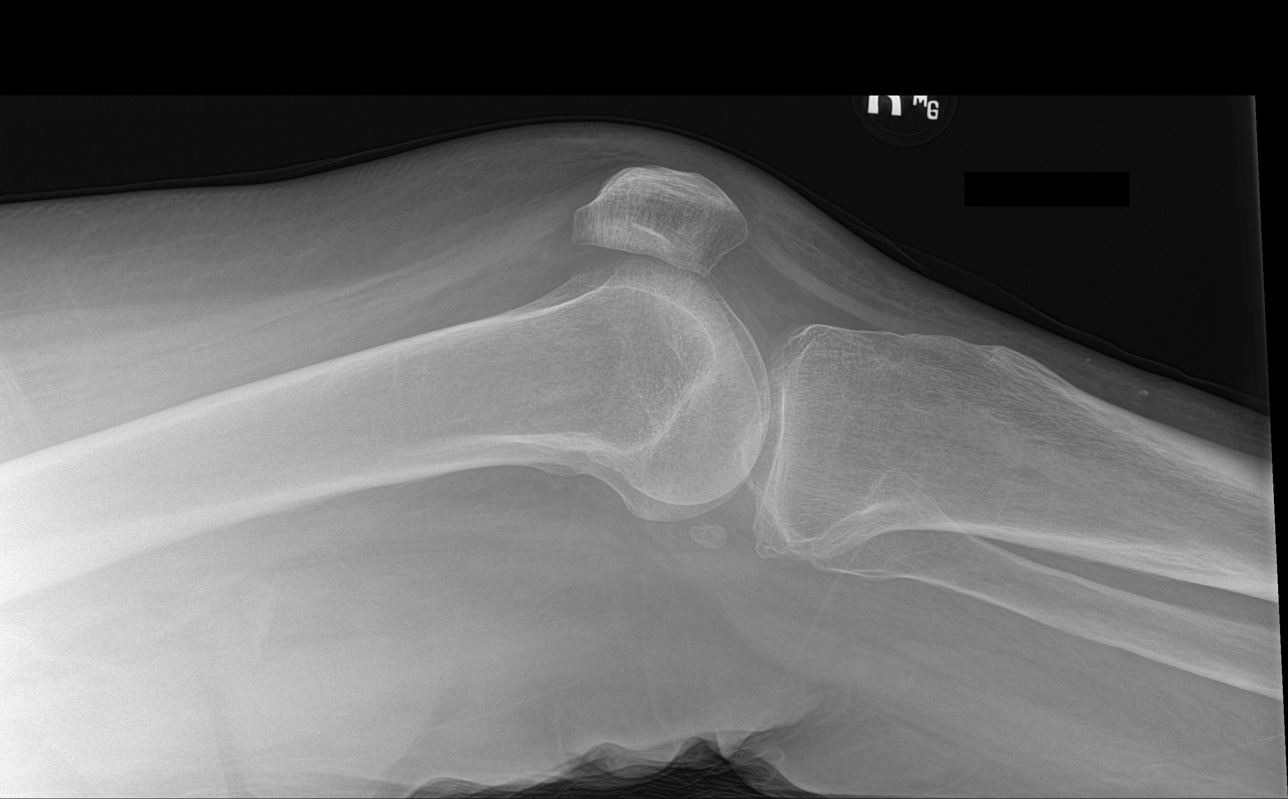

[knee obl (1 of 2)]
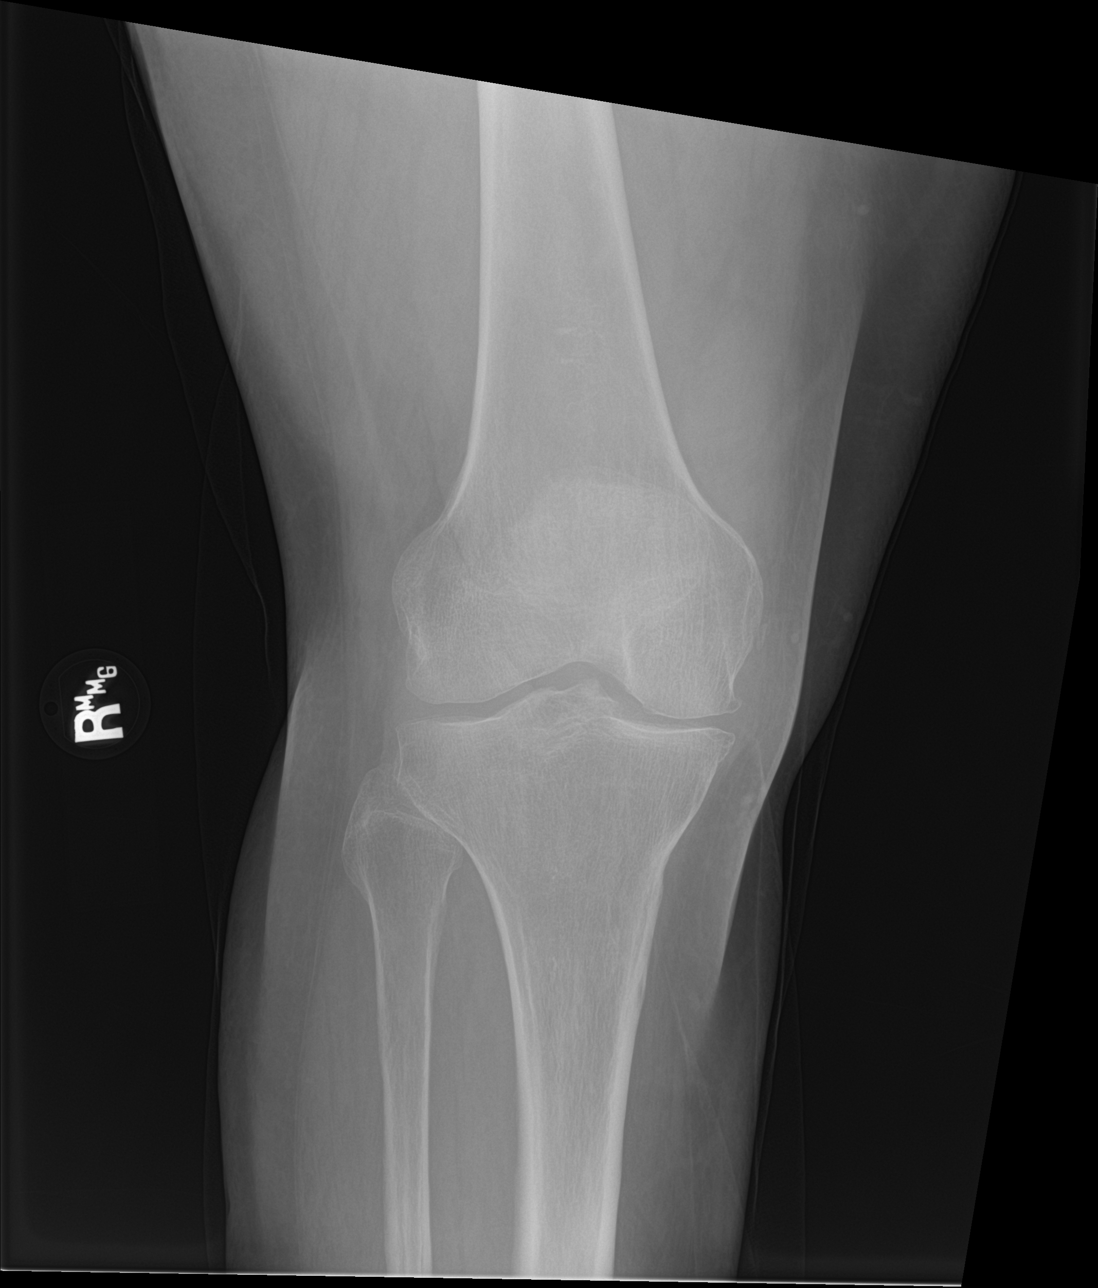

[knee obl (2 of 2)]
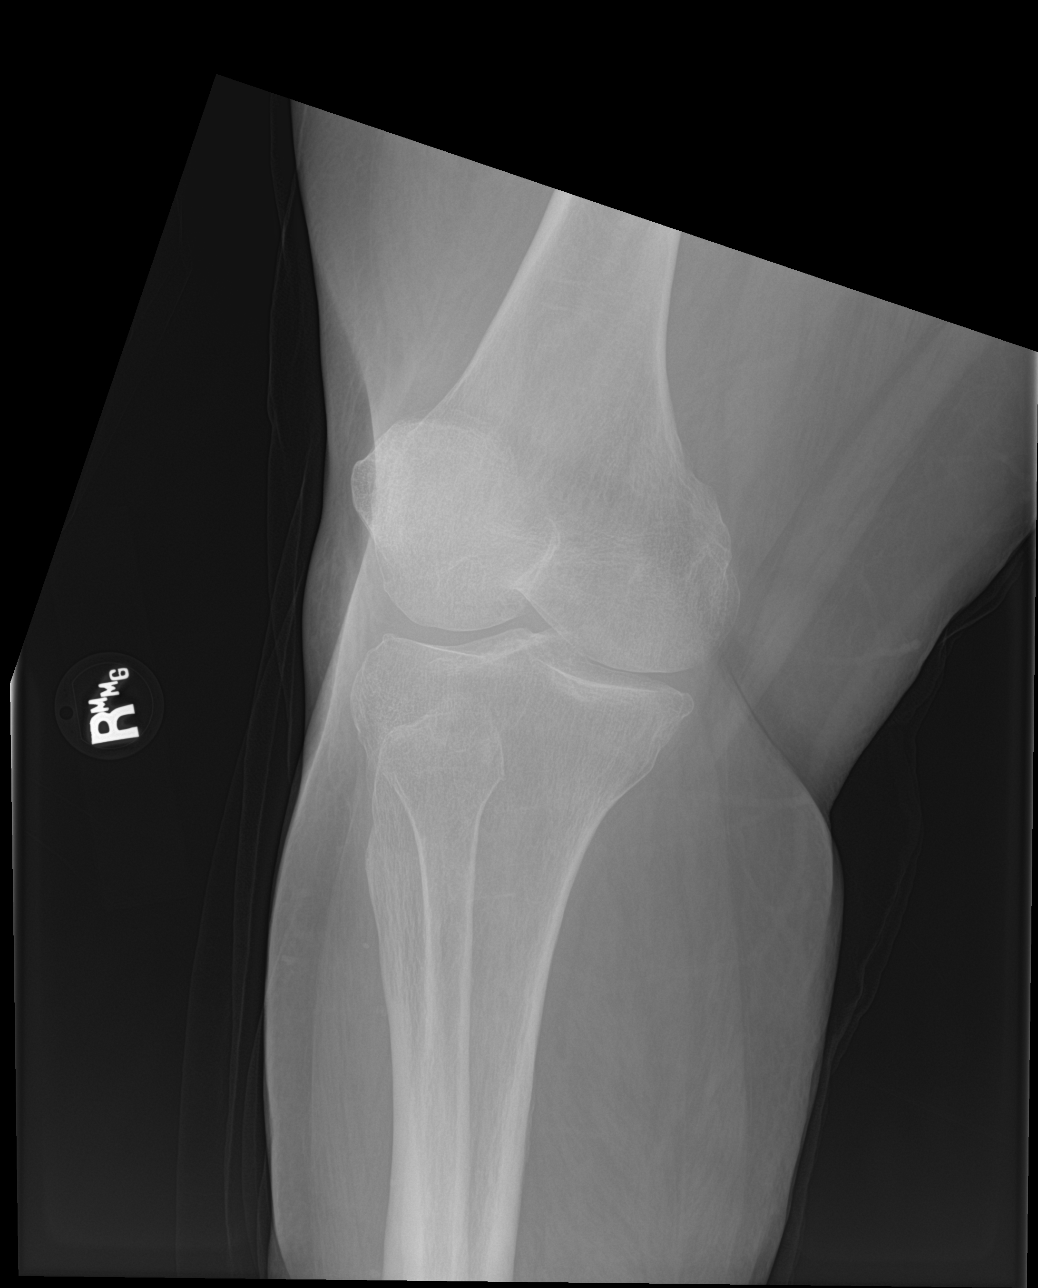

[4 of 4 positions shown; findings below may reference images not displayed]

FINDINGS: Degenerative changes in the right knee with medial greater than
lateral compartment narrowing and small osteophytes in the medial
and lateral compartment. No evidence of acute fracture or
dislocation in the right knee. No focal bone lesion or bone
destruction. Bone cortex appears intact. No significant effusion.
Soft tissues are unremarkable.
IMPRESSION: Mild degenerative changes in the right knee. No acute bony
abnormalities.

## 2015-11-11 IMAGING — CT CT MAXILLOFACIAL W/O CM
2 of 5 series · 11 of 47 positions shown, 13 images · non-contrast
Comparison: 04/24/2015 and prior CTs

CLINICAL DATA: 48-year-old male with acute headache, loss
consciousness, facial pain and swelling following fall. Initial
encounter.

EXAM:
CT HEAD WITHOUT CONTRAST
CT MAXILLOFACIAL WITHOUT CONTRAST
TECHNIQUE: Multidetector CT imaging of the head and maxillofacial structures
were performed using the standard protocol without intravenous
contrast. Multiplanar CT image reconstructions of the maxillofacial
structures were also generated.

[Series 308: sagittal bone · coronal · 0.39mm/px · 8 of 69 slices shown, 10 images]
[im 8/69  brain]
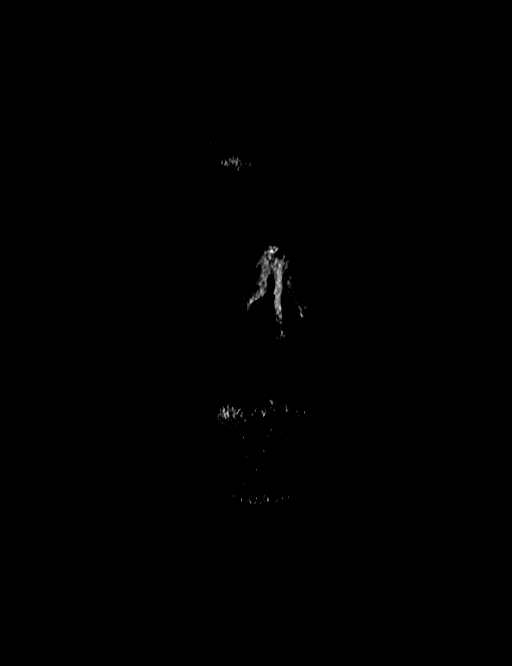
[im 8/69  bone]
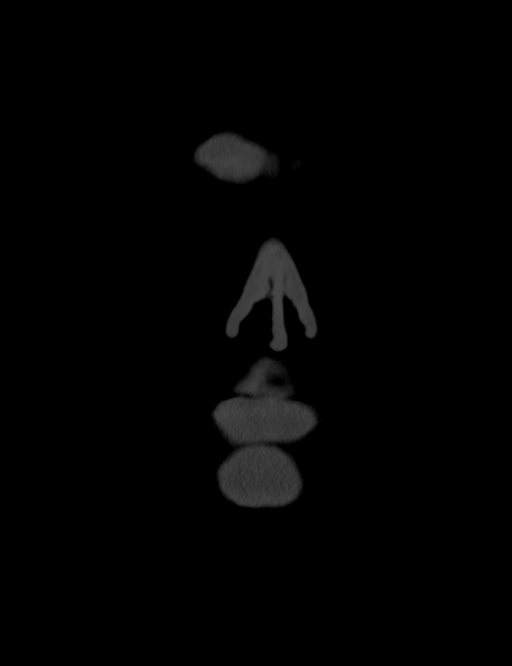
[im 16/69  bone]
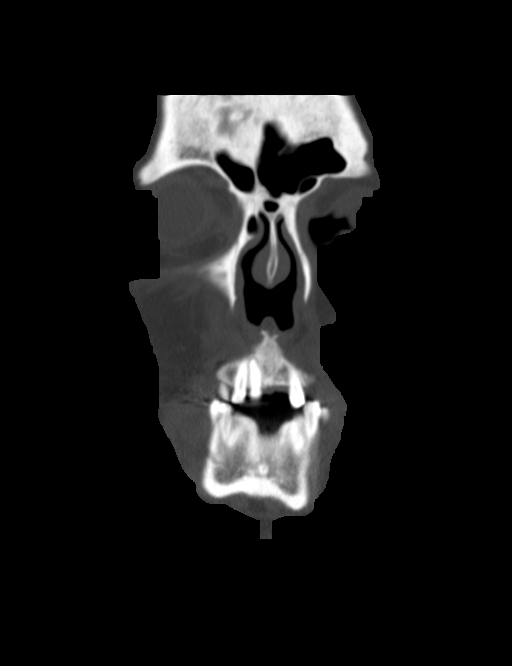
[im 23/69  bone]
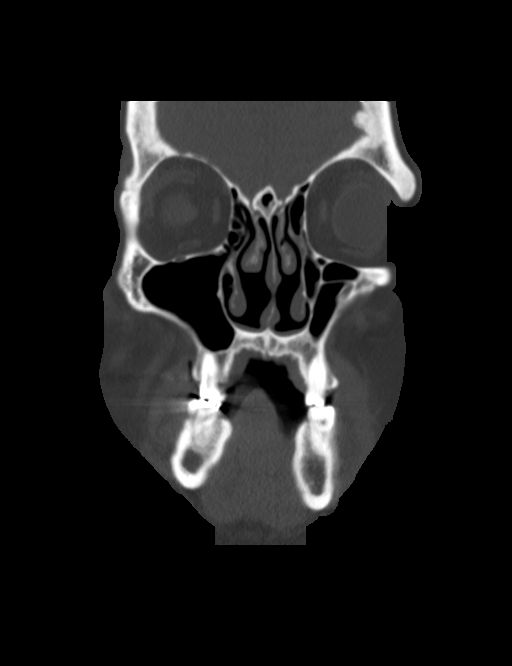
[im 31/69  bone]
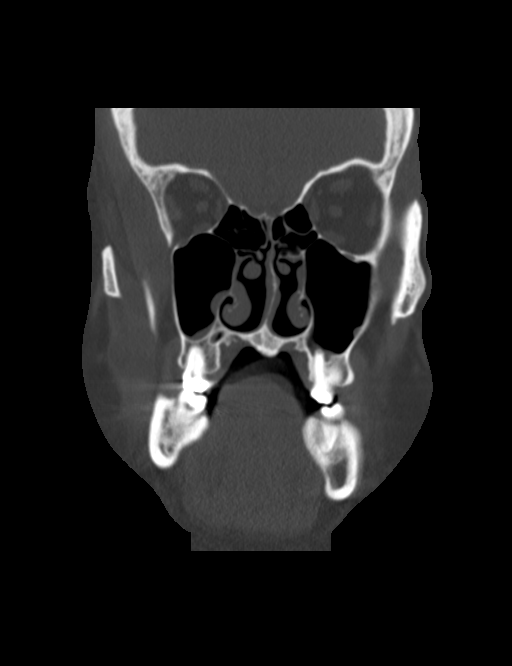
[im 38/69  brain]
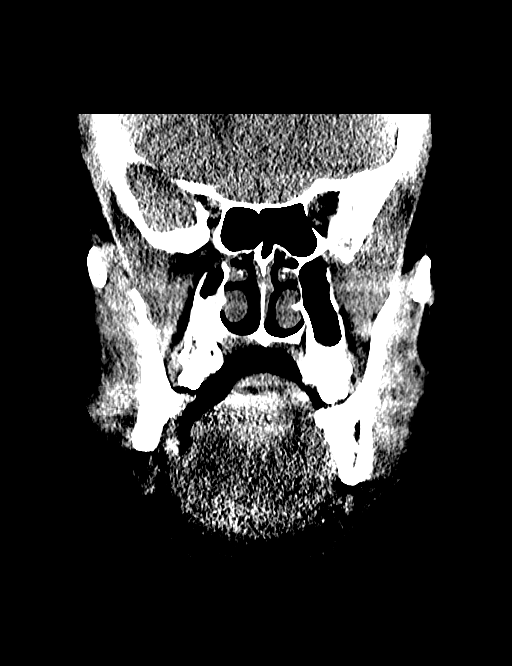
[im 38/69  bone]
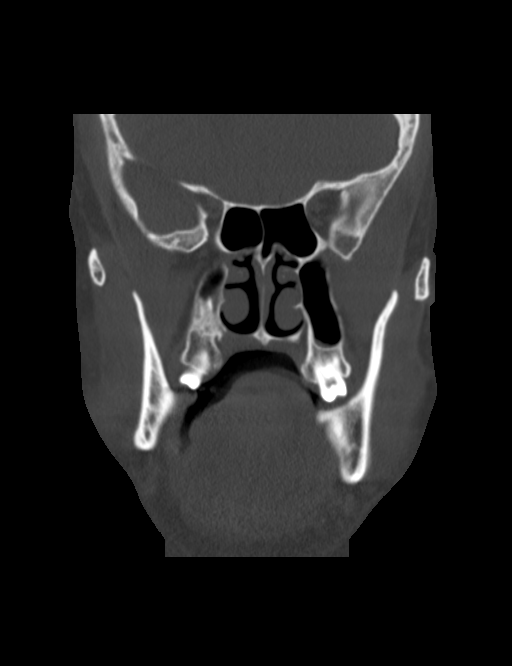
[im 46/69  bone]
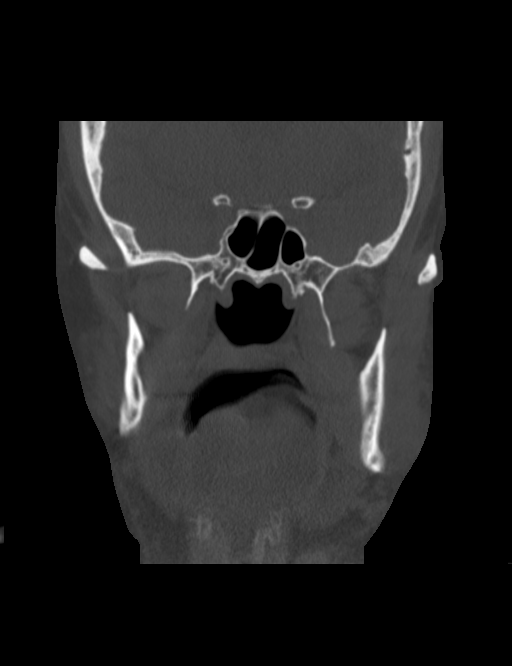
[im 53/69  bone]
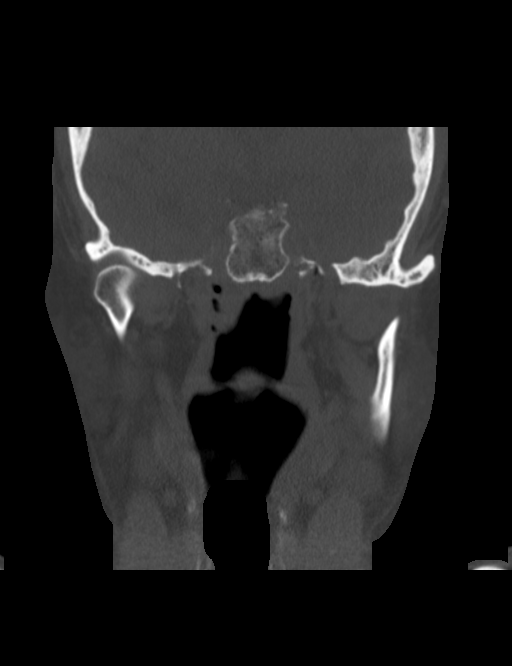
[im 61/69  bone]
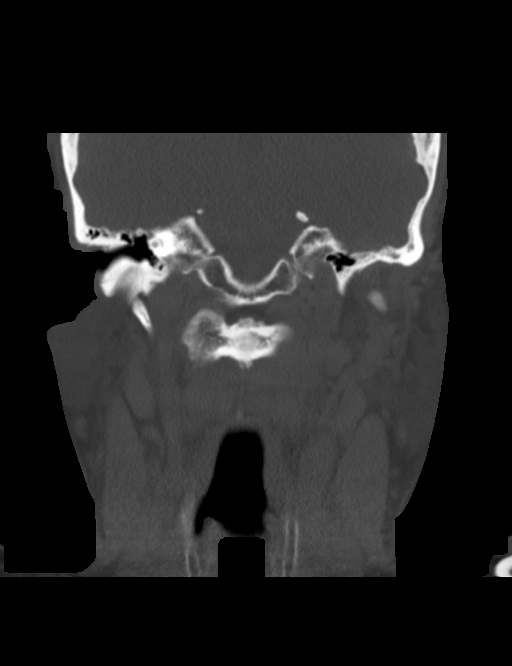

[Series 309: sagittal soft tissue · sagittal · 0.39mm/px · 3 of 76 slices shown]
[im 26/76  bone]
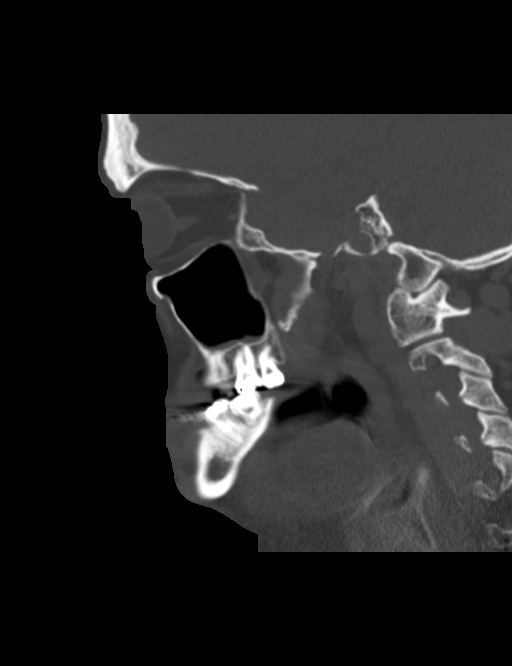
[im 38/76  bone]
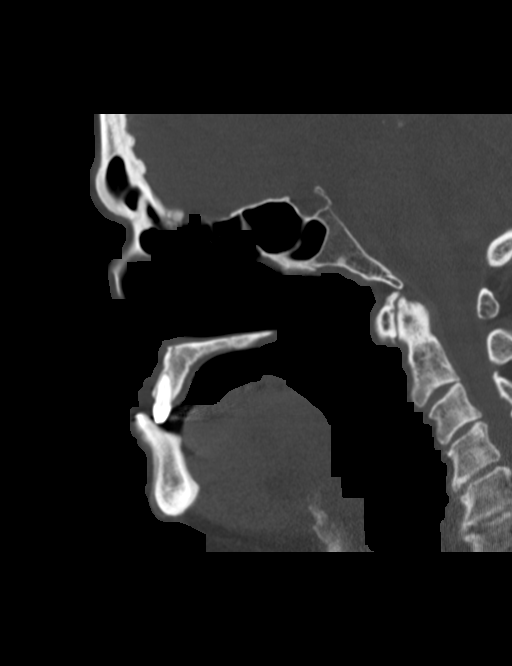
[im 51/76  bone]
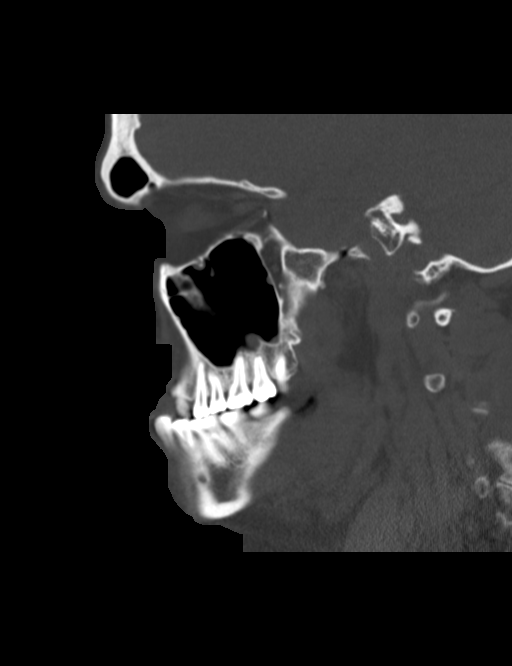

[11 of 47 positions shown; findings below may reference images not displayed]

FINDINGS: CT HEAD FINDINGS

Mild chronic small-vessel white matter ischemic changes and mild
atrophy identified.

No acute intracranial abnormalities are identified, including mass
lesion or mass effect, hydrocephalus, extra-axial fluid collection,
midline shift, hemorrhage, or acute infarction. The visualized bony
calvarium is unremarkable.

CT MAXILLOFACIAL FINDINGS

Right facial soft tissue swelling is noted.

A remote right nasal bone fracture is again identified.

There is no evidence of acute fracture, subluxation or dislocation.

The paranasal sinuses, mastoid air cells and middle/ inner ears are
clear.

Orbits and globes are unremarkable. There is no evidence of
intraconal or postseptal abnormality.
IMPRESSION: No evidence of acute intracranial abnormality. Mild atrophy and
chronic small-vessel white matter ischemic changes.

Right facial soft tissue swelling without acute facial fracture.

Remote right nasal fracture.

## 2016-07-02 ENCOUNTER — Other Ambulatory Visit (HOSPITAL_COMMUNITY): Payer: Self-pay | Admitting: Internal Medicine

## 2016-07-02 ENCOUNTER — Ambulatory Visit (HOSPITAL_COMMUNITY)
Admission: RE | Admit: 2016-07-02 | Discharge: 2016-07-02 | Disposition: A | Discharge status: Home / Self Care | Payer: Medicare Other | Source: Ambulatory Visit | Attending: Internal Medicine | Admitting: Internal Medicine

## 2016-07-02 DIAGNOSIS — K9423 Gastrostomy malfunction: Secondary | ICD-10-CM | POA: Diagnosis present

## 2016-07-02 DIAGNOSIS — Y733 Surgical instruments, materials and gastroenterology and urology devices (including sutures) associated with adverse incidents: Secondary | ICD-10-CM | POA: Insufficient documentation

## 2016-07-02 DIAGNOSIS — R633 Feeding difficulties, unspecified: Secondary | ICD-10-CM

## 2016-07-02 MED ORDER — IOPAMIDOL (ISOVUE-300) INJECTION 61%
INTRAVENOUS | Status: DC | PRN
  Administered 2016-07-02: 5 mL via INTRAVENOUS

## 2016-07-02 NOTE — Procedures (Signed)
Replace 71F G tube under fluoro No complication No blood loss. See complete dictation in [facility] PACS.

## 2016-10-19 ENCOUNTER — Encounter (HOSPITAL_COMMUNITY): Payer: Self-pay

## 2016-10-19 ENCOUNTER — Emergency Department (HOSPITAL_COMMUNITY)
Admission: EM | Admit: 2016-10-19 | Discharge: 2016-10-19 | Disposition: A | Discharge status: Home / Self Care | Payer: Medicare Other | Attending: Emergency Medicine | Admitting: Emergency Medicine

## 2016-10-19 DIAGNOSIS — R627 Adult failure to thrive: Secondary | ICD-10-CM

## 2016-10-19 DIAGNOSIS — K9423 Gastrostomy malfunction: Secondary | ICD-10-CM | POA: Diagnosis not present

## 2016-10-19 DIAGNOSIS — E039 Hypothyroidism, unspecified: Secondary | ICD-10-CM | POA: Insufficient documentation

## 2016-10-19 DIAGNOSIS — I252 Old myocardial infarction: Secondary | ICD-10-CM | POA: Diagnosis not present

## 2016-10-19 DIAGNOSIS — Z91040 Latex allergy status: Secondary | ICD-10-CM | POA: Diagnosis not present

## 2016-10-19 NOTE — ED Notes (Signed)
Report to antionette at [facility]. [facility] contacted for transport at her request.

## 2016-10-19 NOTE — ED Provider Notes (Signed)
[facility]-EMERGENCY DEPT Provider Note   CSN: [id number] Arrival date & time: 10/19/16  [id number]     History   Chief Complaint Chief Complaint  Patient presents with  . Wound Check    g tube replacement  . Abdominal Pain    HPI [name] is a 50 y.o. male.  Patient with history of anoxic brain injury and quadriplegia however he is moving all 4 extremities here. Patient sent in for G-tube replacement. However he does have a Foley catheter that was placed at the nursing home last evening when the other G-tube came out. Also some reports of him complaining of abdominal pain. There was vomiting yesterday but none reported today. We spoke with the nursing home. Patient just states he hurts all over and they said that's is normal response. Patient according nursing home was sent in to have the G-tube replaced.      Past Medical History:  Diagnosis Date  . A-fib (HCC)   . Addison disease (HCC)   . Addison disease (HCC)   . Anoxic brain injury (HCC)   . Anxiety   . Aspiration pneumonia (HCC)   . Atrial fibrillation (HCC)   . Chronic constipation 11/23/2011  . Dysphagia   . Dysphagia   . Encephalopathy   . Glaucoma   . Glucocorticoid deficiency (HCC)   . History of recurrent UTIs   . Hyperlipemia   . Hypothyroidism   . Mental disorder   . Myocardial infarction   . Pneumoperitoneum of unknown etiology 12/21/2011  . Quadriplegia (HCC)   . Reflux   . Seizure disorder [facility](HCC)     Patient Active Problem List   Diagnosis Date Noted  . Agitation 07/03/2014  . Cardiomyopathy, ischemic 06/18/2014  . 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 Acute psychosis 04/19/2014  . Unspecified hypothyroidism 05/16/2013  . Constipation 05/16/2013  . Anemia of other chronic disease 05/16/2013  . Mental disorder 01/03/2013  . Addisons disease (HCC) 01/03/2013  . PEG (percutaneous endoscopic gastrostomy) adjustment/replacement/removal (HCC) 12/21/2011  . Hypokalemia 11/25/2011  . Thrombocytopenia  (HCC) 11/25/2011  . FTT (failure to thrive) in adult 11/25/2011  . Grand mal seizure (HCC) 11/23/2011  . Depression (emotion) 11/23/2011  . History of atrial fibrillation 11/23/2011  . Other specified hemorrhagic conditions 11/23/2011  . Glaucoma 11/23/2011  . Hypoglycemia 11/23/2011  . Chronic constipation 11/23/2011  . Anoxic brain injury (HCC) 11/12/2011  . Protein calorie malnutrition (HCC) 11/12/2011  . Hyperchloremia 11/12/2011  . Hyperthyroidism 11/12/2011  . UTI (lower urinary tract infection) 11/12/2011  . Dysphagia     Past Surgical History:  Procedure Laterality Date  . NEPHRECTOMY  unknown  . PEG TUBE PLACEMENT         Home Medications    Prior to Admission medications   Medication Sig Start Date End Date Taking? Authorizing Provider  acetaminophen (TYLENOL) 325 MG tablet Place 650 mg into feeding tube every 4 (four) hours as needed for mild pain.   Yes Historical Provider, MD  divalproex (DEPAKOTE SPRINKLE) 125 MG capsule Take 250-500 mg by mouth 3 (three) times daily. Take 4 capsules (500 mg) via peg tube twice daily (9am and 9pm) and 2 capsules (250 mg) via peg tube daily at noon 07/18/13  Yes Historical Provider, MD  haloperidol lactate (HALDOL) 5 MG/ML injection Inject 2 mg into the muscle every 6 (six) hours as needed.   Yes Historical Provider, MD  levothyroxine (SYNTHROID, LEVOTHROID) 150 MCG tablet Place 150 mcg into feeding tube daily before breakfast.   Yes  Historical Provider, MD  loratadine (CLARITIN) 10 MG tablet 10 mg by PEG Tube route daily.    Yes Historical Provider, MD  METOPROLOL SUCCINATE ER PO 12.5 mg by PEG Tube route 2 (two) times daily. Metoprolol tartrate 6.25 mg/5 ml suspension   Yes Historical Provider, MD  Multiple Vitamins-Minerals (CERTAVITE/ANTIOXIDANTS) LIQD 15 mLs by PEG Tube route daily at 12 noon.    Yes Historical Provider, MD  Nutritional Supplements (VITAL 1.5 CAL PO) Take 237 mLs by mouth as needed (For less than 50% food  consumption.).   Yes Historical Provider, MD  pantoprazole sodium (PROTONIX) 40 mg/20 mL PACK 40 mg by PEG Tube route daily at 12 noon.  11/30/11  Yes [name], MD  PARoxetine (PAXIL) 10 MG tablet 10 mg by PEG Tube route daily.    Yes Historical Provider, MD  polyethylene glycol (MIRALAX / GLYCOLAX) packet 17 g by PEG Tube route 2 (two) times daily. Mix 17gm  In 4-8 ounces of liquid   Yes Historical Provider, MD  polyvinyl alcohol (LIQUIFILM TEARS) 1.4 % ophthalmic solution Place 1 drop into both eyes 4 (four) times daily. 9am, 2pm, 6pm, 9pm   Yes Historical Provider, MD  risperiDONE (RISPERDAL) 0.5 MG tablet 1 mg by PEG Tube route 2 (two) times daily.  04/13/15  Yes Historical Provider, MD  sennosides (SENOKOT) 8.8 MG/5ML syrup Take 15 mLs by mouth 2 (two) times daily.   Yes Historical Provider, MD  traZODone (DESYREL) 50 MG tablet Take 25-50 mg by mouth See admin instructions. Take 1/2 tablet (25 mg) by mouth twice daily at 8am and 2pm, may also take 1 tablet (50 mg) 3 additional times daily as needed for anxiety 08/02/13  Yes Historical Provider, MD    Family History History reviewed. No pertinent family history.  Social History Social History  Substance Use Topics  . Smoking status: Never Smoker  . Smokeless tobacco: Never Used  . Alcohol use No     Allergies   Ativan [lorazepam]; Latex; Morphine and related; Penicillins; Pyridium [phenazopyridine hcl]; and Soy allergy   Review of Systems Review of Systems  Unable to perform ROS: Dementia     Physical Exam Updated Vital Signs BP 121/72 (BP Location: Right Arm)   Pulse 70   Temp 98.7 F (37.1 C) (Oral)   Resp 16   Ht 6' (1.829 m)   Wt 113.4 kg   SpO2 98%   BMI 33.91 kg/m   Physical Exam  Constitutional: He appears well-developed and well-nourished.  HENT:  Mouth/Throat: Oropharynx is clear and moist.  Eyes: Conjunctivae and EOM are normal.  Neck: Neck supple.  Cardiovascular: Intact distal pulses.     Pulmonary/Chest: Effort normal and breath sounds normal.  Abdominal: Soft. Bowel sounds are normal. He exhibits no distension. There is no tenderness.  Foley catheter in place of G-tube left upper quadrant. No evidence of any infection. Some scattered bruising on the abdomen felt to be secondary to injections.  Neurological: He is alert.  Skin: Skin is warm.  Nursing note and vitals reviewed.    ED Treatments / Results  Labs (all labs ordered are listed, but only abnormal results are displayed) Labs Reviewed - No data to display  EKG  EKG Interpretation None       Radiology No results found.  Procedures Procedures (including critical care time)  Medications Ordered in ED Medications - No data to display   Initial Impression / Assessment and Plan / ED Course  I have reviewed  the triage vital signs and the nursing notes.  Pertinent labs & imaging results that were available during my care of the patient were reviewed by me and considered in my medical decision making (see chart for details).  Clinical Course   Patient in no apparent distress here. Abdomen soft normal bowel sounds. G-tube flushed with normal saline and then was able to draw back gastric contents. No evidence of any obstruction G-tube seems to be functioning properly. This is a temporary Foley G-tube. Discuss with interventional radiology they will schedule him for permanent G-tube placement tomorrow at 1:00 PM. Patient be sent back to nursing facility.   Final Clinical Impressions(s) / ED Diagnoses   Final diagnoses:  Gastrostomy tube dysfunction [facility])    New Prescriptions New Prescriptions   No medications on file     Vanetta Mulders, MD 10/19/16 1112

## 2016-10-19 NOTE — Discharge Instructions (Signed)
Foley catheter functioning as temporary G-tube is working properly. Made an appointment with interventional radiology for him to have per minute G-tube replaced tomorrow, October 24, at 1:00 PM. [facility] radiology first floor.

## 2016-10-19 NOTE — ED Notes (Signed)
Antionette, RN at [facility] contacted for further information. States pt will respond to pain question with response of "Hurts everywhere." but was c/o abdominal pain before tube pulled out.States their protocol is to send pt to ED for tube replacement throuygh interventional radiology.

## 2016-10-19 NOTE — ED Notes (Signed)
Pt moved to hall per [employer] Charge RN.

## 2016-10-19 NOTE — ED Notes (Signed)
Report given to PT[facility], pt d/c with paperwork from ED as well as paperwork that was provided from facility on arrival.

## 2016-10-19 NOTE — ED Triage Notes (Addendum)
Pt arrives via [facility] from [facility] with c/o gtube pulled out last night and needs replacement. Pt c/o abdominal pain that started when gtube was pulled out. Pt arrives with foley at gtube site that was placed PTA. Pt states he hurst"everywhere" and has hx of brain injury. [facility] resp even and non labored.

## 2016-10-20 ENCOUNTER — Inpatient Hospital Stay (HOSPITAL_COMMUNITY): Admit: 2016-10-20 | Payer: Medicare Other

## 2016-10-20 ENCOUNTER — Encounter (HOSPITAL_COMMUNITY): Payer: Self-pay | Admitting: Interventional Radiology

## 2016-10-20 ENCOUNTER — Other Ambulatory Visit (HOSPITAL_COMMUNITY): Payer: Self-pay | Admitting: Emergency Medicine

## 2016-10-20 ENCOUNTER — Ambulatory Visit (HOSPITAL_COMMUNITY): Admit: 2016-10-20 | Payer: Medicare Other

## 2016-10-20 ENCOUNTER — Ambulatory Visit (HOSPITAL_COMMUNITY)
Admission: RE | Admit: 2016-10-20 | Discharge: 2016-10-20 | Disposition: A | Discharge status: Home / Self Care | Payer: Medicare Other | Source: Ambulatory Visit | Attending: Emergency Medicine | Admitting: Emergency Medicine

## 2016-10-20 DIAGNOSIS — G8250 Quadriplegia, unspecified: Secondary | ICD-10-CM | POA: Insufficient documentation

## 2016-10-20 DIAGNOSIS — Z431 Encounter for attention to gastrostomy: Secondary | ICD-10-CM | POA: Insufficient documentation

## 2016-10-20 DIAGNOSIS — R1310 Dysphagia, unspecified: Secondary | ICD-10-CM | POA: Diagnosis not present

## 2016-10-20 HISTORY — PX: IR GENERIC HISTORICAL: IMG1180011

## 2016-10-20 MED ORDER — IOPAMIDOL (ISOVUE-300) INJECTION 61%
INTRAVENOUS | Status: AC
  Administered 2016-10-20: 10 mL
  Filled 2016-10-20: qty 50

## 2016-10-20 MED ORDER — LIDOCAINE VISCOUS 2 % MT SOLN
OROMUCOSAL | Status: AC
  Filled 2016-10-20: qty 15

## 2016-11-22 IMAGING — XA IR REPLACE G-TUBE/COLONIC TUBE
1 series · 2 of 2 positions shown · non-contrast
Comparison: none

INDICATION: Dysphagia. Quadriplegia by report. New long-term indwelling
gastrostomy catheter.

[Series 300: ir replc gastro/colonic tube percut w/fl · 2 of 2 slices shown]
[im 1/2]
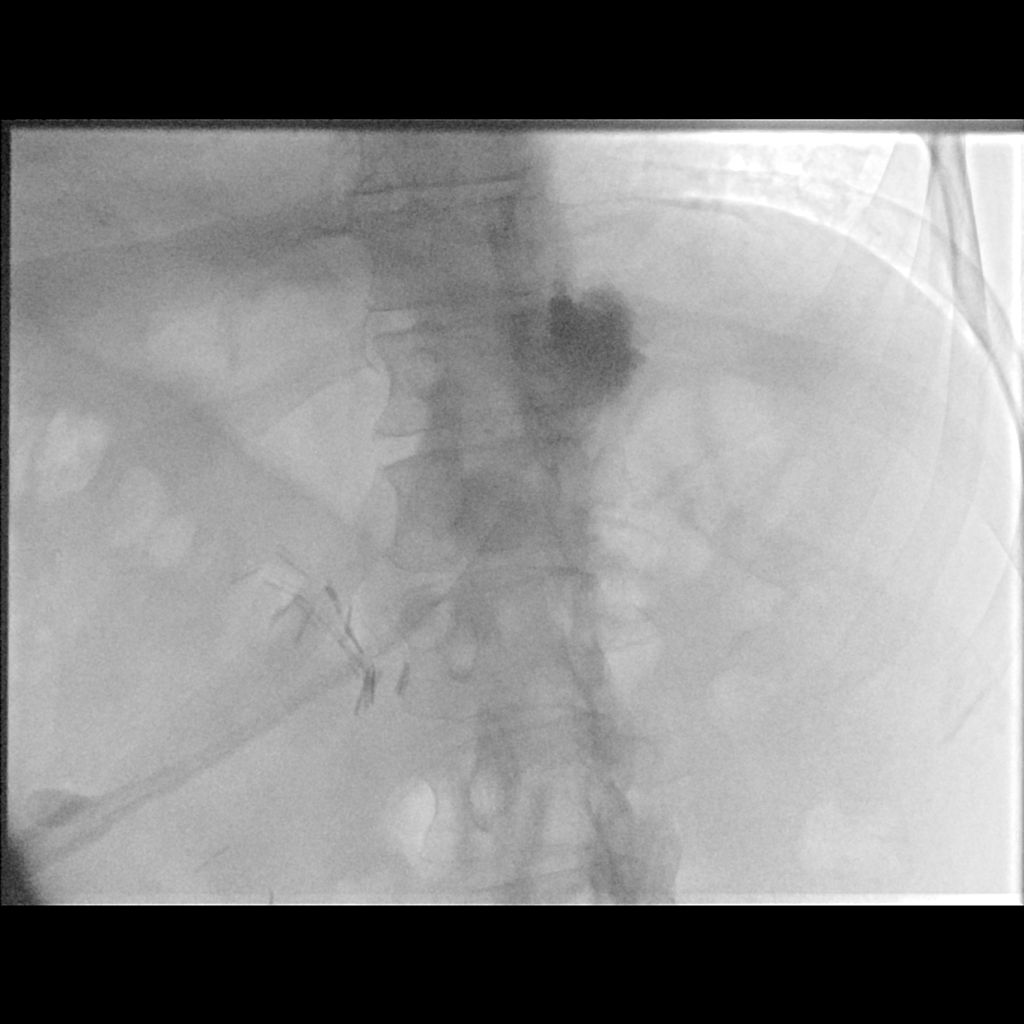
[im 2/2]
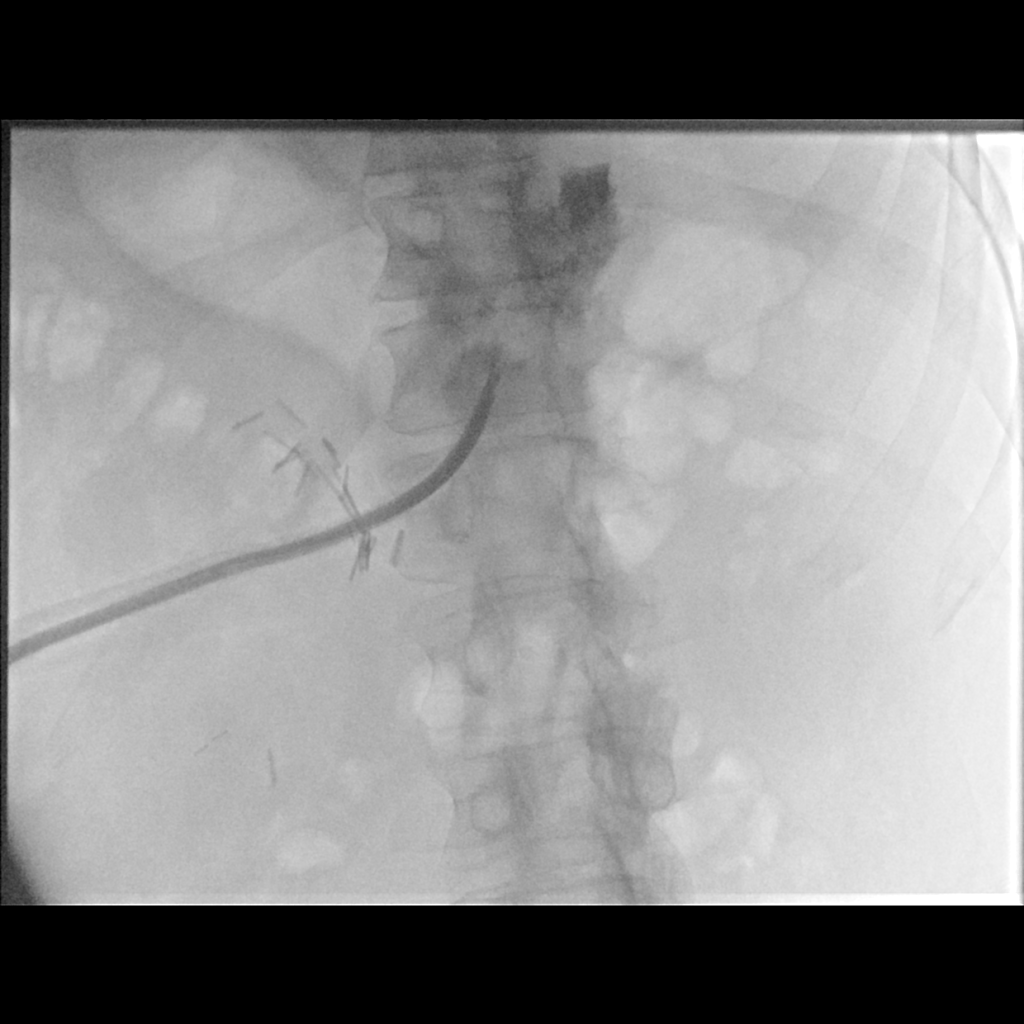

[2 of 2 positions shown; findings below may reference images not displayed]

EXAM:
GASTROSTOMY CATHETER REPLACEMENT

MEDICATIONS:
None indicated

ANESTHESIA/SEDATION:
None used

CONTRAST:  10mL EZRE41-V11 IOPAMIDOL (EZRE41-V11) INJECTION 61% -
administered into the gastric lumen.

FLUOROSCOPY TIME:  Fluoroscopy Time: 0.1 minute, 25 uAym0 DAP

COMPLICATIONS:
None immediate.

:
Previously placed gastrostomy tube and surrounding skin were prepped
with Betadine, draped in usual sterile fashion.

The retention balloon was deflated. The catheter was removed and a
new 22 French balloon retention catheter passage through the tract
into the gastric lumen Under fluoroscopy. The retention balloon was
inflated. Contrast injection confirms appropriate positioning.

The patient tolerated the procedure well.
IMPRESSION: 1. Technically successful exchange of percutaneous 22 French balloon
retention gastrostomy catheter under fluoroscopy

## 2017-02-08 ENCOUNTER — Other Ambulatory Visit (HOSPITAL_COMMUNITY): Payer: Self-pay | Admitting: Internal Medicine

## 2017-02-10 ENCOUNTER — Other Ambulatory Visit (HOSPITAL_COMMUNITY): Payer: Self-pay | Admitting: Internal Medicine

## 2017-02-10 DIAGNOSIS — T148XXA Other injury of unspecified body region, initial encounter: Secondary | ICD-10-CM

## 2017-02-12 ENCOUNTER — Other Ambulatory Visit (HOSPITAL_COMMUNITY): Payer: Self-pay | Admitting: Internal Medicine

## 2017-02-12 DIAGNOSIS — IMO0002 Reserved for concepts with insufficient information to code with codable children: Secondary | ICD-10-CM

## 2017-02-17 ENCOUNTER — Ambulatory Visit (HOSPITAL_COMMUNITY)
Admission: RE | Admit: 2017-02-17 | Discharge: 2017-02-17 | Disposition: A | Discharge status: Home / Self Care | Payer: Medicare Other | Source: Ambulatory Visit | Attending: Internal Medicine | Admitting: Internal Medicine

## 2017-02-17 ENCOUNTER — Encounter (HOSPITAL_COMMUNITY): Payer: Self-pay

## 2017-02-17 DIAGNOSIS — IMO0002 Reserved for concepts with insufficient information to code with codable children: Secondary | ICD-10-CM

## 2017-02-22 ENCOUNTER — Other Ambulatory Visit (HOSPITAL_COMMUNITY): Payer: Self-pay | Admitting: Internal Medicine

## 2017-02-22 ENCOUNTER — Ambulatory Visit (HOSPITAL_COMMUNITY)
Admission: RE | Admit: 2017-02-22 | Discharge: 2017-02-22 | Disposition: A | Discharge status: Home / Self Care | Payer: Medicare Other | Source: Ambulatory Visit | Attending: Internal Medicine | Admitting: Internal Medicine

## 2017-02-22 DIAGNOSIS — IMO0002 Reserved for concepts with insufficient information to code with codable children: Secondary | ICD-10-CM

## 2017-02-22 DIAGNOSIS — Z905 Acquired absence of kidney: Secondary | ICD-10-CM | POA: Diagnosis not present

## 2017-02-22 DIAGNOSIS — J986 Disorders of diaphragm: Secondary | ICD-10-CM | POA: Insufficient documentation

## 2017-02-22 DIAGNOSIS — K651 Peritoneal abscess: Secondary | ICD-10-CM | POA: Diagnosis not present

## 2017-02-22 DIAGNOSIS — N281 Cyst of kidney, acquired: Secondary | ICD-10-CM | POA: Diagnosis not present

## 2017-03-09 ENCOUNTER — Other Ambulatory Visit (HOSPITAL_COMMUNITY): Payer: Self-pay | Admitting: Internal Medicine

## 2017-03-09 DIAGNOSIS — T148XXA Other injury of unspecified body region, initial encounter: Secondary | ICD-10-CM

## 2017-03-15 ENCOUNTER — Ambulatory Visit (HOSPITAL_COMMUNITY)
Admission: RE | Admit: 2017-03-15 | Discharge: 2017-03-15 | Disposition: A | Discharge status: Home / Self Care | Payer: Medicare Other | Source: Ambulatory Visit | Attending: Internal Medicine | Admitting: Internal Medicine

## 2017-03-15 DIAGNOSIS — W19XXXA Unspecified fall, initial encounter: Secondary | ICD-10-CM | POA: Diagnosis not present

## 2017-03-15 DIAGNOSIS — T148XXA Other injury of unspecified body region, initial encounter: Secondary | ICD-10-CM

## 2017-03-15 DIAGNOSIS — M7989 Other specified soft tissue disorders: Secondary | ICD-10-CM | POA: Diagnosis not present

## 2017-03-22 ENCOUNTER — Other Ambulatory Visit (HOSPITAL_COMMUNITY): Payer: Self-pay | Admitting: Internal Medicine

## 2017-03-22 DIAGNOSIS — R1319 Other dysphagia: Secondary | ICD-10-CM

## 2017-03-27 IMAGING — MR MR ABDOMEN W/O CM
6 of 10 series · 22 of 48 positions shown · non-contrast
Comparison: CT scan 01/03/2013

CLINICAL DATA: Abdominal pain.

EXAM:
MRI ABDOMEN WITHOUT CONTRAST
TECHNIQUE: Multiplanar multisequence MR imaging was performed without the
administration of intravenous contrast.

[Series 6: cor ssfse nav · coronal · 6.0mm · 0.94mm/px · 3 of 41 slices shown]
[im 1/41]
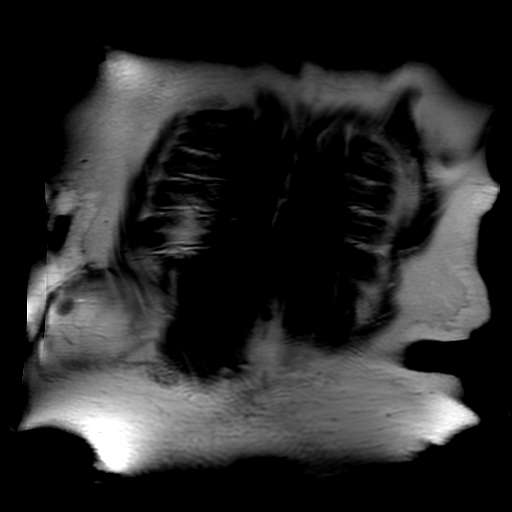
[im 21/41]
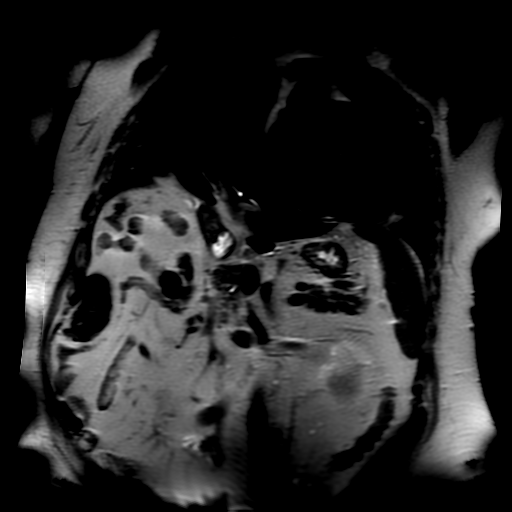
[im 41/41]
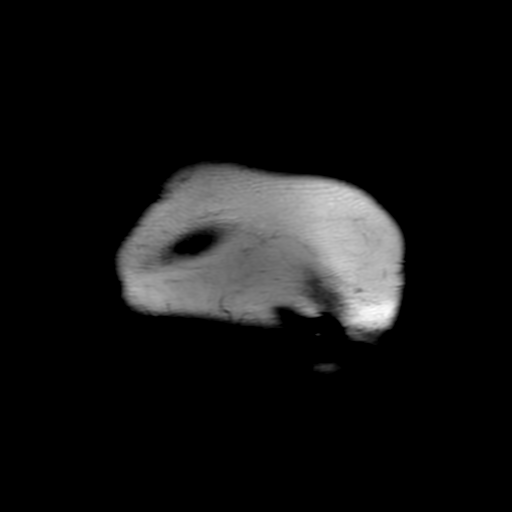

[Series 8: ax ssfse nav · axial · 6.0mm · 0.98mm/px · z∈[-102,+296]mm · 4 of 68 slices shown]
[im 1/68]
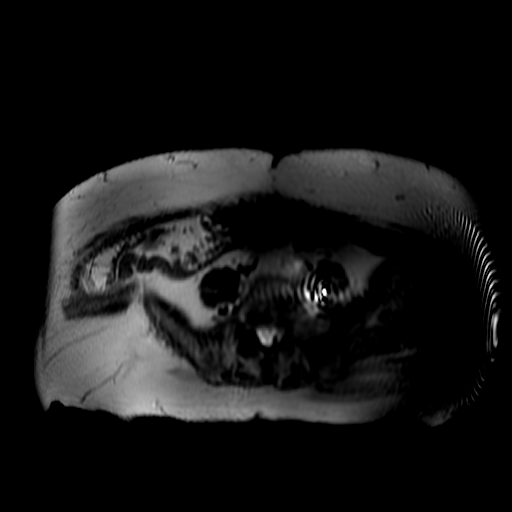
[im 23/68]
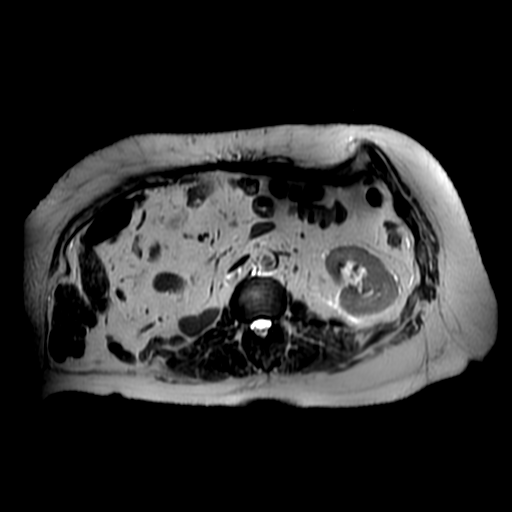
[im 45/68]
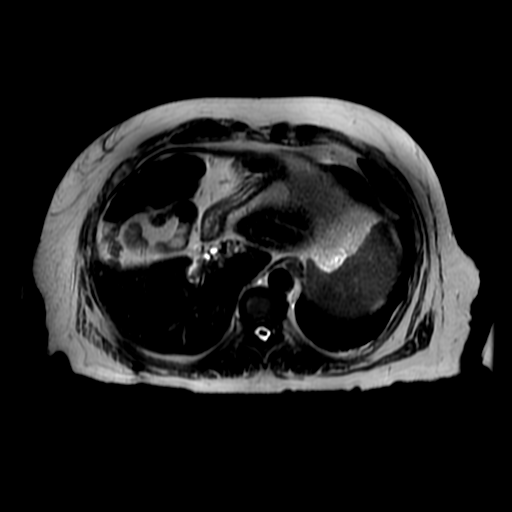
[im 68/68]
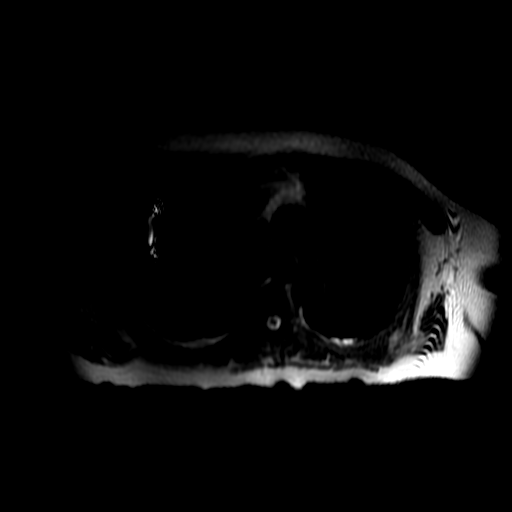

[Series 11: T2 fat-sat · axial · 6.0mm · 0.98mm/px · z∈[+58,+296]mm · 2 of 41 slices shown (1 of 2)]
[im 1/41]
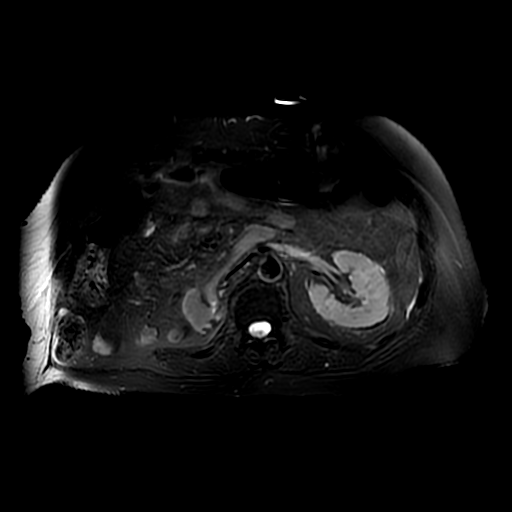
[im 41/41]
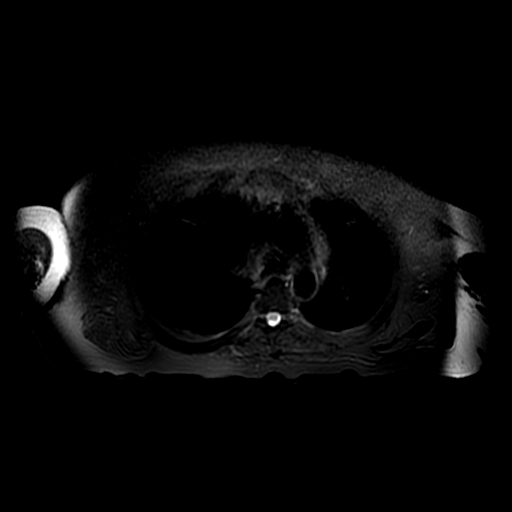

[Series 13: T2 fat-sat · axial · 6.0mm · 0.98mm/px · z∈[-139,+92]mm · 2 of 40 slices shown (2 of 2)]
[im 1/40]
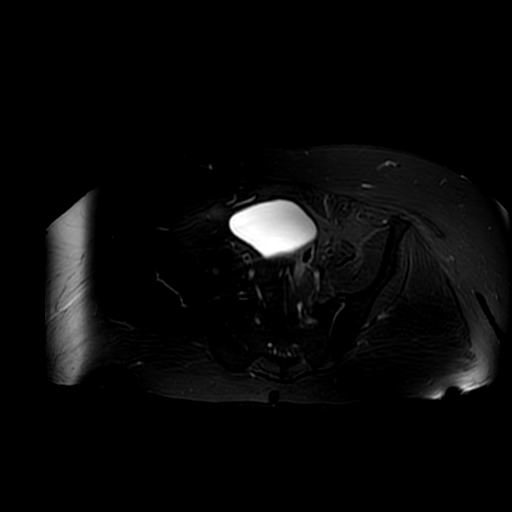
[im 40/40]
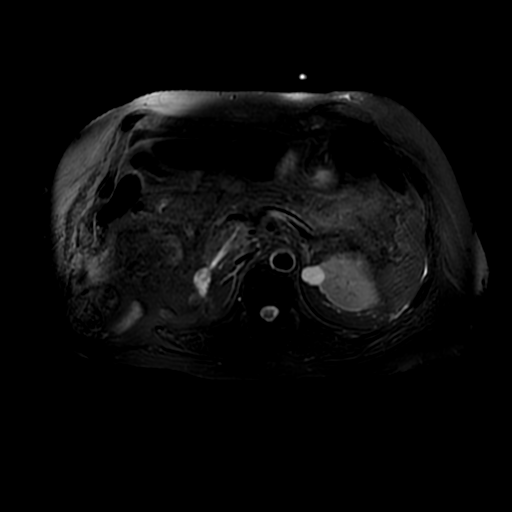

[Series 15: DWI b500 · axial · 8.0mm · 1.95mm/px · z∈[-101,+295]mm · 4 of 82 slices shown]
[im 1/82]
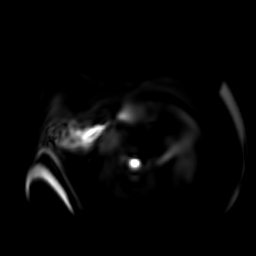
[im 28/82]
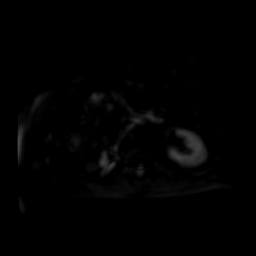
[im 55/82]
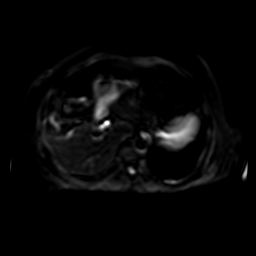
[im 82/82]
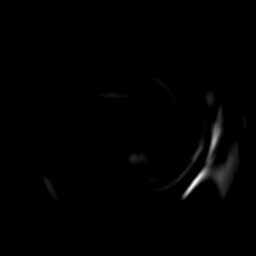

[Series 20: T1 dynamic post-contrast · sagittal · non-contrast · 5.0mm · 0.94mm/px · 7 of 142 slices shown]
[im 1/142]
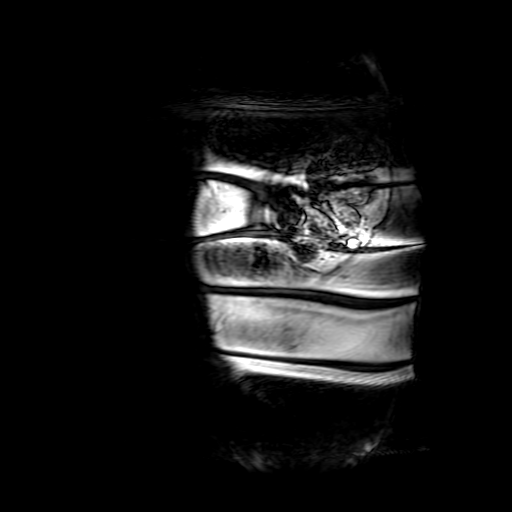
[im 24/142]
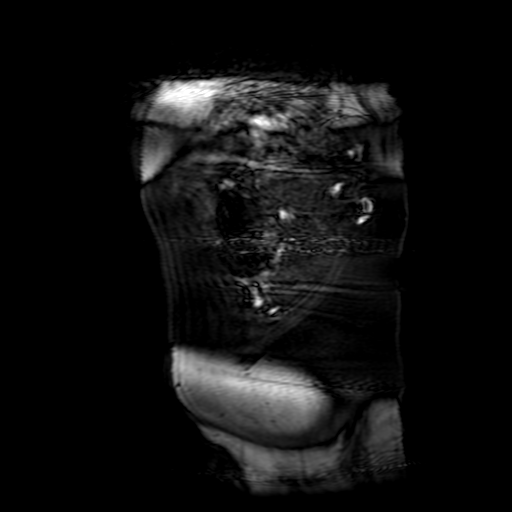
[im 48/142]
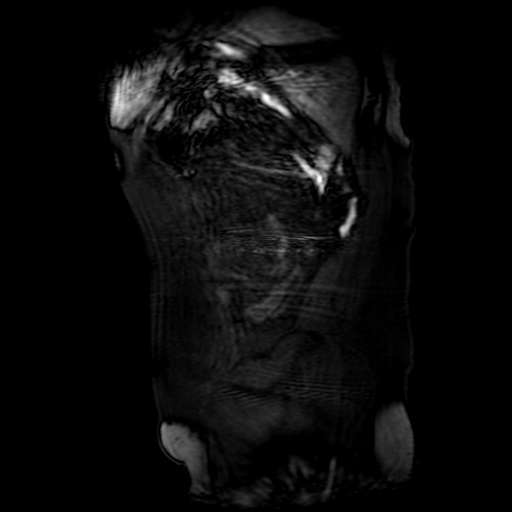
[im 71/142]
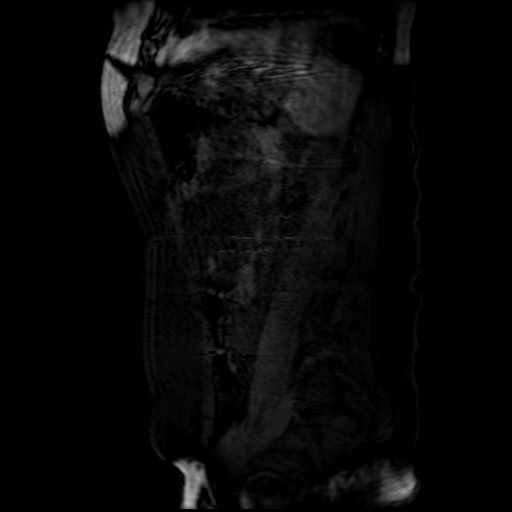
[im 95/142]
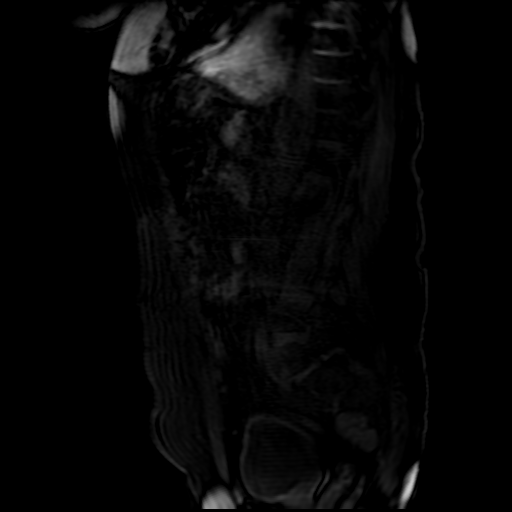
[im 118/142]
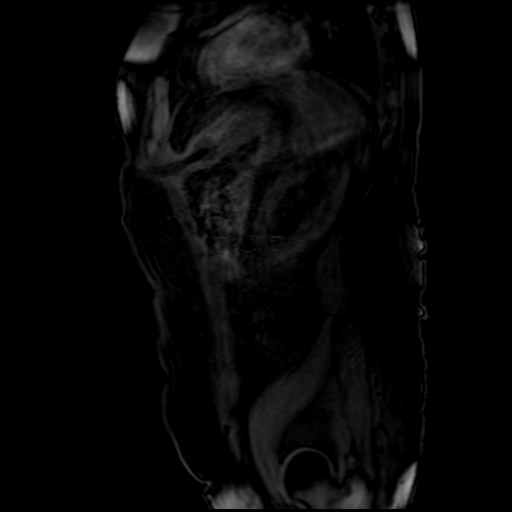
[im 142/142]
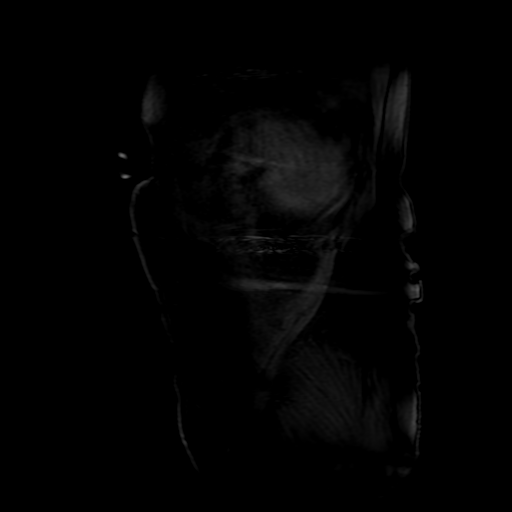

[22 of 48 positions shown; findings below may reference images not displayed]

FINDINGS: Examination is limited due to patient motion and being
uncooperative. IV access was unsuccessful.

Lower chest: Marked elevation of the right hemidiaphragm is noted.
No worrisome pulmonary lesions or pleural effusion. No pericardial
effusion.

Hepatobiliary: No obvious hepatic lesion and no intrahepatic biliary
dilatation. The gallbladder is surgically absent. Normal caliber and
course of the common bile duct.

Pancreas:  No mass, inflammation or ductal dilatation.

Spleen:  Normal size.  No focal lesions.

Adrenals/Urinary Tract: The left adrenal gland and left kidney are
unremarkable. The right kidney is surgically absent. Small left
renal cysts are noted. Upper pole medial cyst is septated but no
other worrisome imaging features.

Stomach/Bowel: A feeding gastrostomy tube is in place. No obvious
small bowel or colonic abnormality. The

Vascular/Lymphatic: No mesenteric or retroperitoneal mass are
adenopathy. The aorta is normal in caliber. No mesenteric or
retroperitoneal lymphadenopathy.

Other:  No ascites or abdominal wall hernia.

Musculoskeletal: No significant bony findings.
IMPRESSION: 1. Limited examination as discussed above.
2. Marked elevation of the right hemidiaphragm.
3. No obvious acute abdominal findings, mass lesions or adenopathy.
4. Status post right nephrectomy. The left kidney is unremarkable.
Left renal cysts are noted.

## 2017-03-29 ENCOUNTER — Ambulatory Visit (HOSPITAL_COMMUNITY)
Admission: RE | Admit: 2017-03-29 | Discharge: 2017-03-29 | Disposition: A | Discharge status: Home / Self Care | Payer: Medicare Other | Source: Ambulatory Visit | Attending: Internal Medicine | Admitting: Internal Medicine

## 2017-03-29 DIAGNOSIS — R1319 Other dysphagia: Secondary | ICD-10-CM | POA: Insufficient documentation

## 2017-03-29 DIAGNOSIS — R1310 Dysphagia, unspecified: Secondary | ICD-10-CM | POA: Diagnosis present

## 2017-03-29 NOTE — Progress Notes (Signed)
Modified Barium Swallow Progress Note  Patient Details  Name: [name] MRN: [id number] Date of Birth: 01-04-1966  Today's Date: 03/29/2017  Modified Barium Swallow completed.  Full report located under Chart Review in the Imaging Section.  Brief recommendations include the following:  Clinical Impression  [name] presented as an outpatient from a SNF for MBS (on nectar thick liquids at facility). Pt exhibits a mild oral and moderate pharyngeal dysphagia. Oral deficits include mild premature spillage with honey thick liquids X1. No residue was observed during this study. Large sips of nectar thick liquids and thin liquids resulted in aspiration during the swallow (due to reduced laryngeal elevation) with a weak and delayed reflexive cough that was ineffective in clearing aspirates. Compensatory strategies were not attempted due to pt's mentation and suspected noncompliance. Due to clinical findings and pt's cognition, recommend either diet option: 1) nectar thick liquids with FULL and CONSISTENT supervision for SMALL sips and mechanical soft solids OR 2) honey thick liquids with intermittent supervision and mechanical soft solids.    Swallow Evaluation Recommendations       SLP Diet Recommendations: Honey thick liquids;Nectar thick liquid;Dysphagia 3 (Mech soft) solids (see impression statement)   Liquid Administration via: Cup;No straw   Medication Administration: Crushed with puree   Supervision: Full supervision/cueing for compensatory strategies;Staff to assist with self feeding   Compensations: Small sips/bites;Slow rate;Minimize environmental distractions   Postural Changes: Seated upright at 90 degrees   Oral Care Recommendations: Oral care BID        Royce Macadamia 03/29/2017,3:33 PM    Breck Coons [employer] [phone]

## 2017-04-17 IMAGING — CT CT MAXILLOFACIAL W/O CM
3 of 6 series · 11 of 47 positions shown, 13 images · non-contrast
Comparison: None.

CLINICAL DATA: Fall. Bilateral orbital bruising. Hematoma to
forehead.

EXAM:
CT MAXILLOFACIAL WITHOUT CONTRAST
TECHNIQUE: Multidetector CT imaging of the maxillofacial structures was
performed. Multiplanar CT image reconstructions were also generated.
A small metallic BB was placed on the right temple in order to
reliably differentiate right from left.

[Series 2: max soft · axial · 0.38mm/px · z∈[-198,-78]mm · 6 of 76 slices shown, 8 images]
[im 10/76  brain]
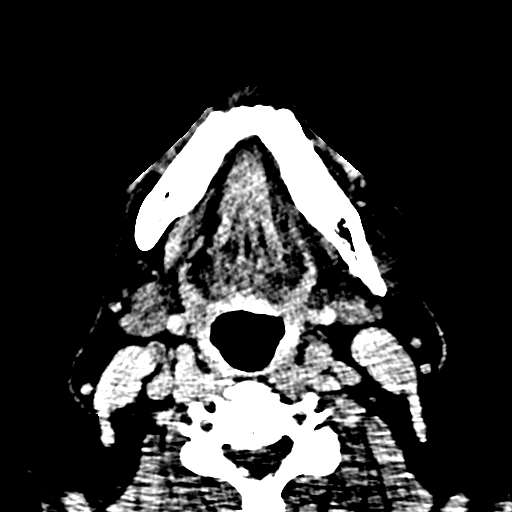
[im 10/76  bone]
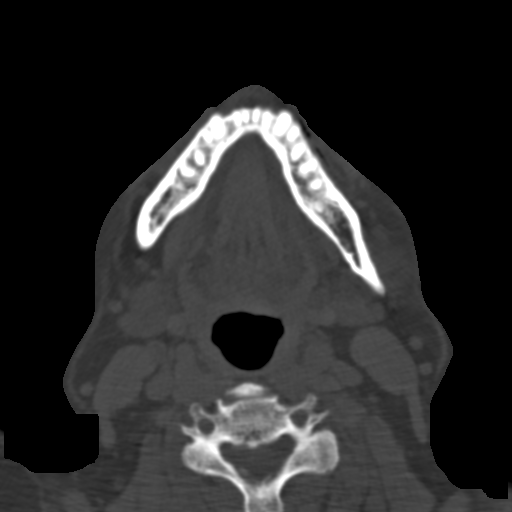
[im 22/76  bone]
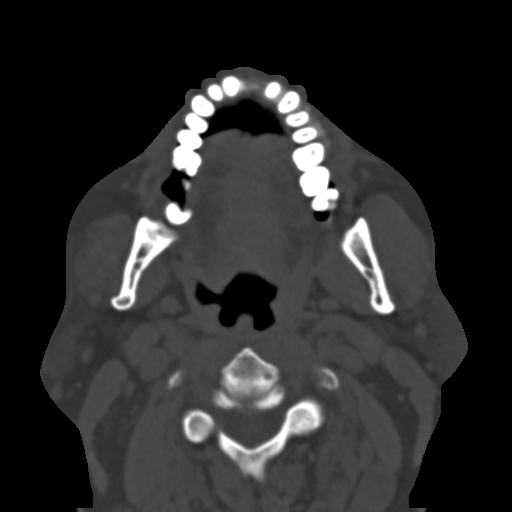
[im 34/76  bone]
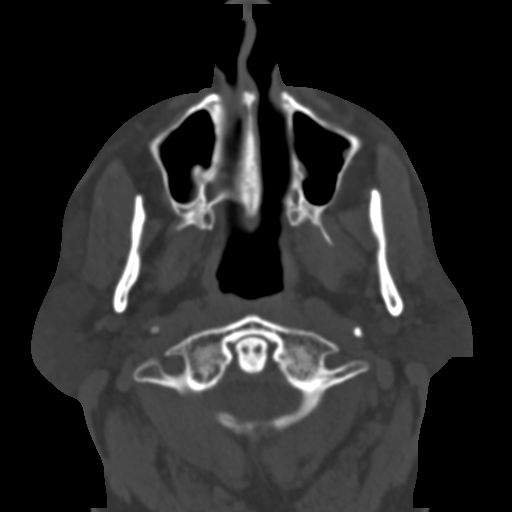
[im 46/76  bone]
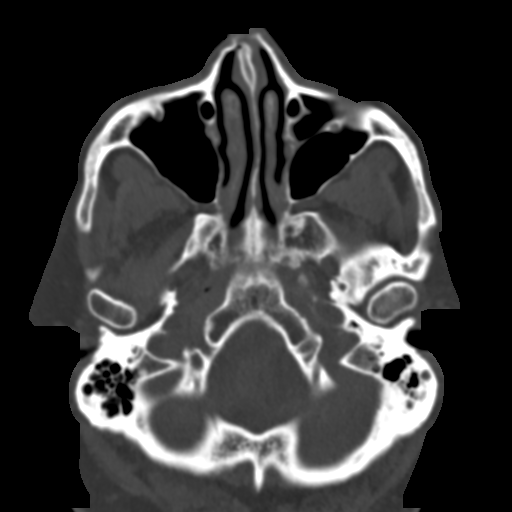
[im 58/76  brain]
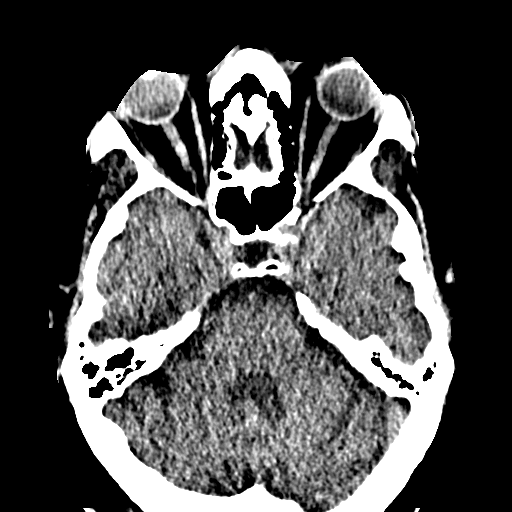
[im 58/76  bone]
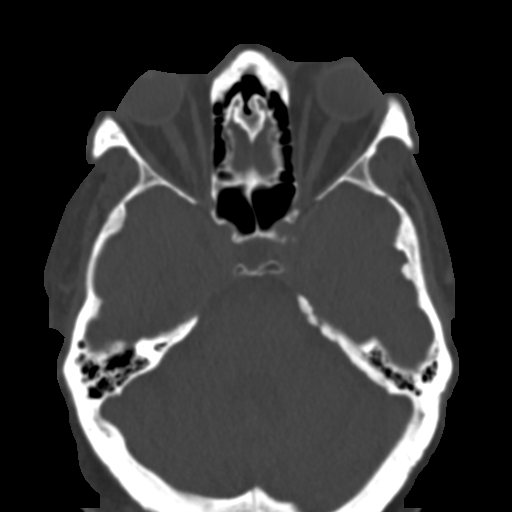
[im 70/76  bone]
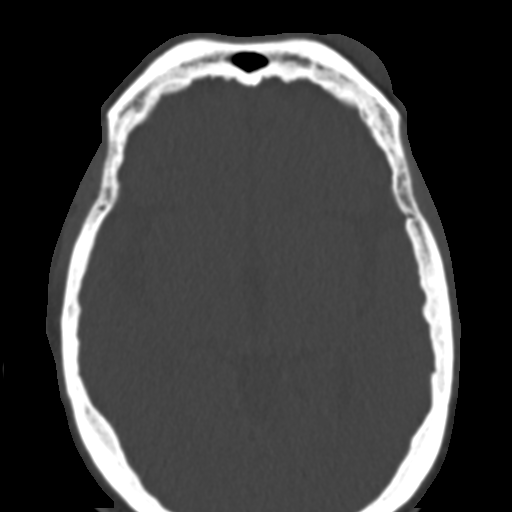

[Series 11: sagittal soft · sagittal · 0.35mm/px · 2 of 95 slices shown]
[im 32/95  bone]
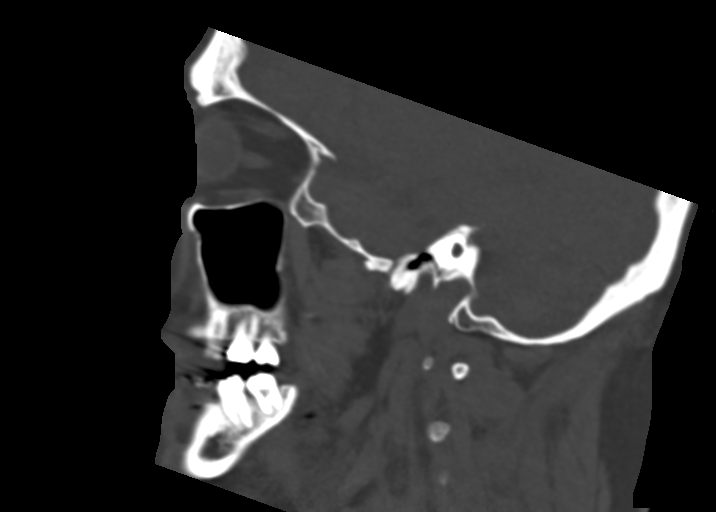
[im 63/95  bone]
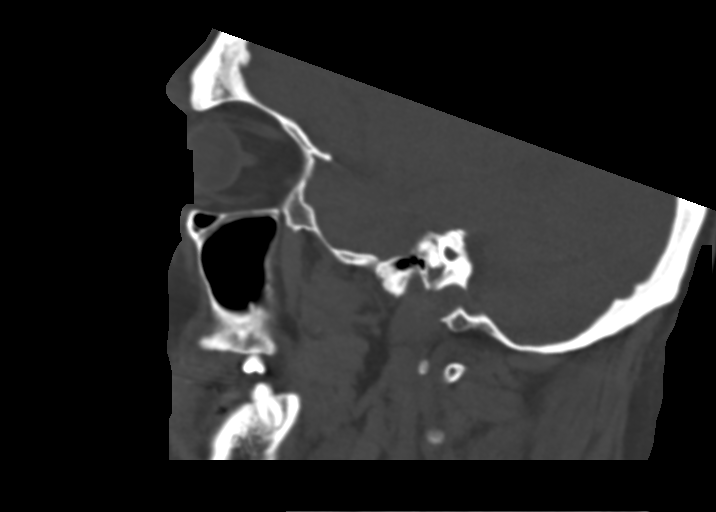

[Series 14: coronal soft · coronal · 0.12mm/px · 3 of 57 slices shown]
[im 22/57  bone]
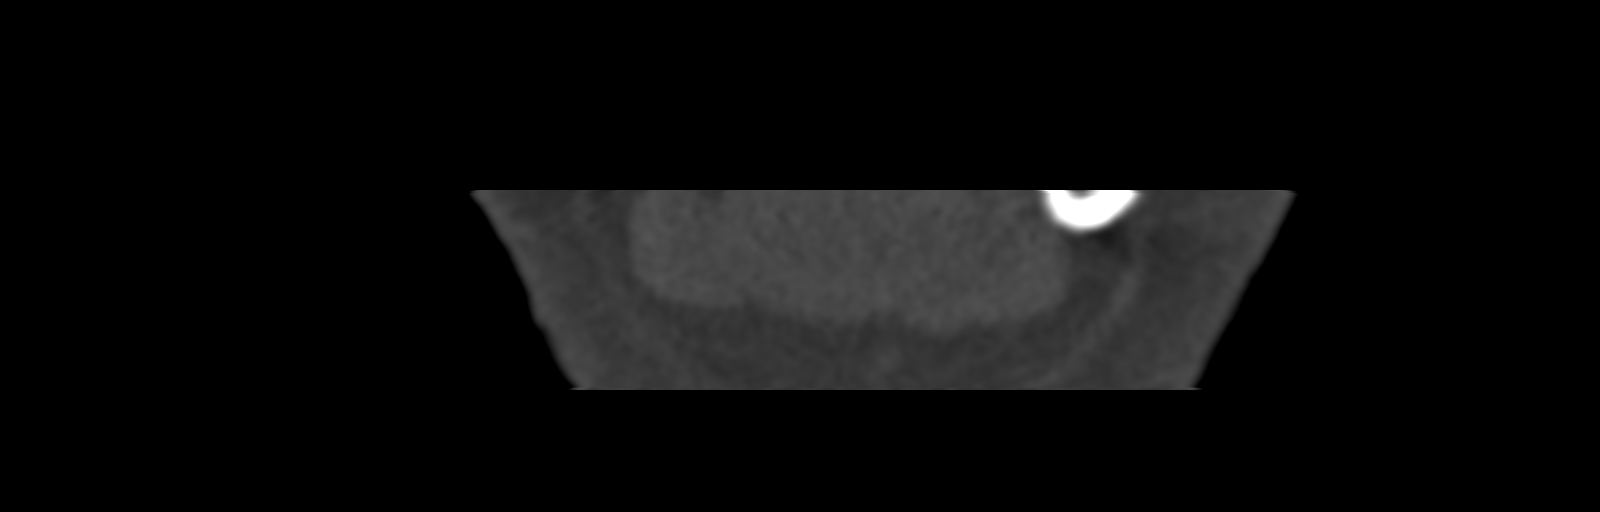
[im 29/57  bone]
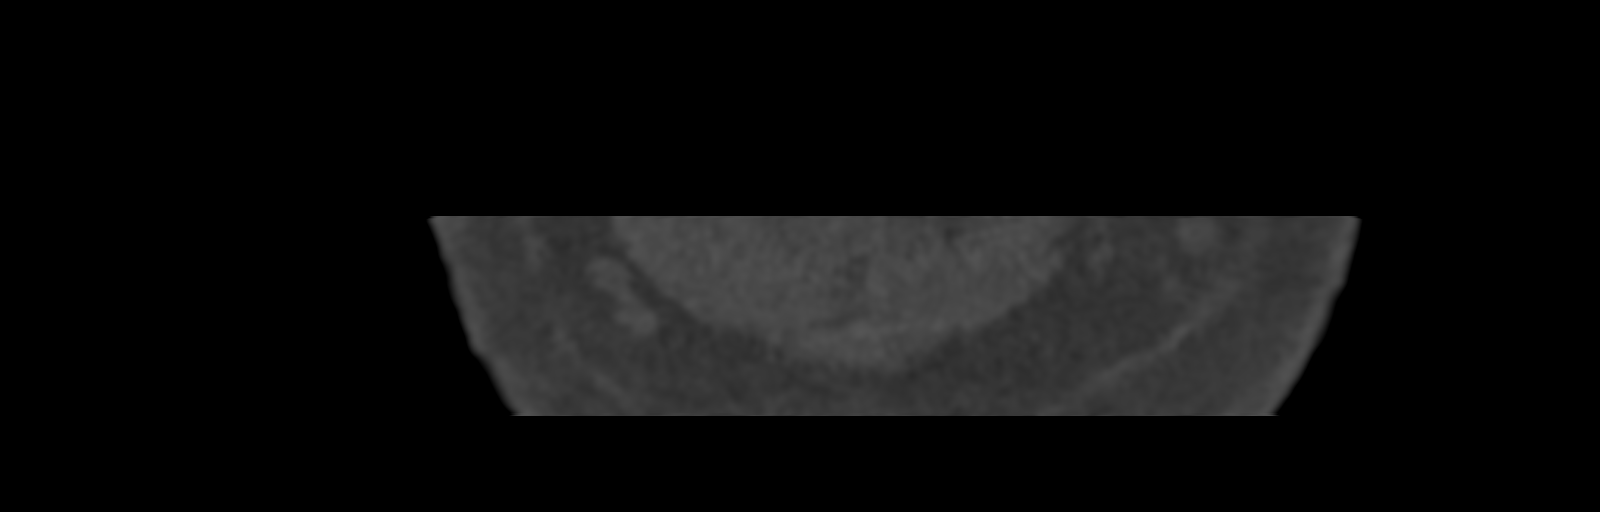
[im 36/57  bone]
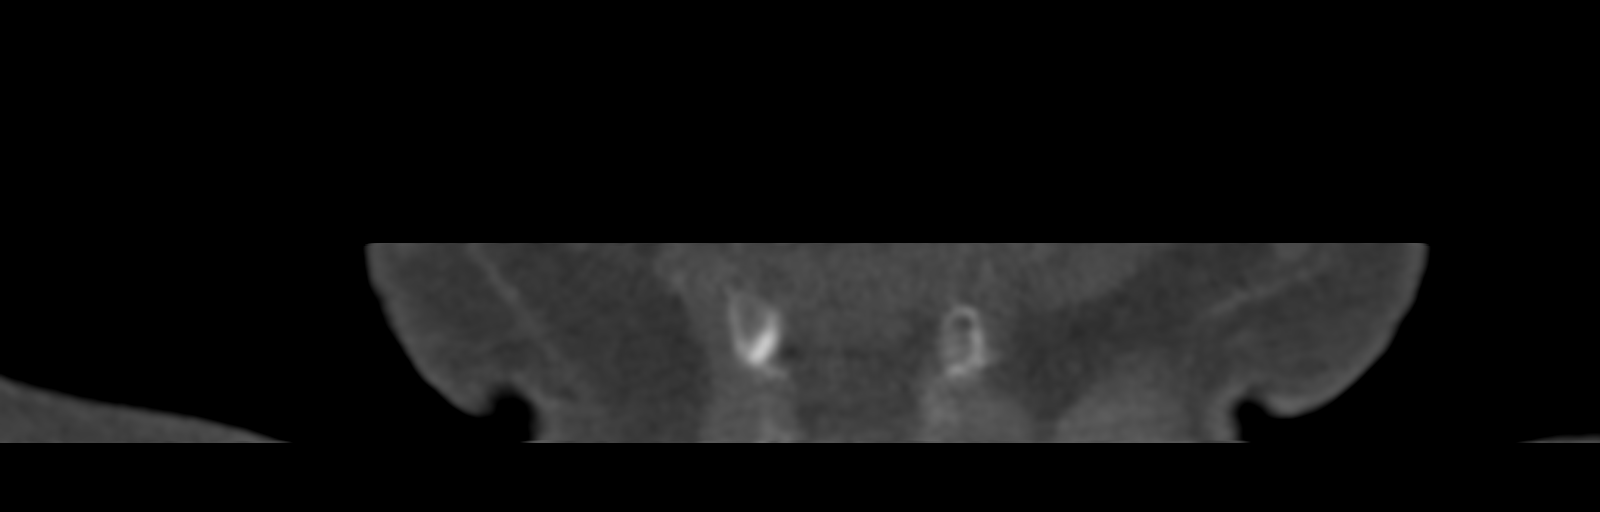

[11 of 47 positions shown; findings below may reference images not displayed]

FINDINGS: Osseous: No fracture or mandibular dislocation. No destructive
process.

Orbits: Soft tissue swelling over the left orbit and forehead. Globe
is intact. No orbital fractures.

Sinuses: Mucosal thickening in the maxillary sinuses. No air-fluid
levels. Mastoid air cells are clear.

Soft tissues: Soft tissue swelling over the left orbit and forehead.

Limited intracranial: No significant or unexpected finding.
IMPRESSION: Soft tissue swelling over the left forehead and orbit. No orbital or
facial fracture.

## 2017-04-20 ENCOUNTER — Other Ambulatory Visit (HOSPITAL_COMMUNITY): Payer: Self-pay | Admitting: Internal Medicine

## 2017-04-20 DIAGNOSIS — Z934 Other artificial openings of gastrointestinal tract status: Secondary | ICD-10-CM

## 2017-04-20 DIAGNOSIS — S069X9S Unspecified intracranial injury with loss of consciousness of unspecified duration, sequela: Secondary | ICD-10-CM

## 2017-04-21 ENCOUNTER — Ambulatory Visit (HOSPITAL_COMMUNITY)
Admission: RE | Admit: 2017-04-21 | Discharge: 2017-04-21 | Disposition: A | Discharge status: Home / Self Care | Payer: Medicare Other | Source: Ambulatory Visit | Attending: Internal Medicine | Admitting: Internal Medicine

## 2017-04-21 ENCOUNTER — Encounter (HOSPITAL_COMMUNITY): Payer: Self-pay | Admitting: Interventional Radiology

## 2017-04-21 DIAGNOSIS — Z431 Encounter for attention to gastrostomy: Secondary | ICD-10-CM | POA: Insufficient documentation

## 2017-04-21 DIAGNOSIS — S069X9S Unspecified intracranial injury with loss of consciousness of unspecified duration, sequela: Secondary | ICD-10-CM

## 2017-04-21 HISTORY — PX: IR REPLC GASTRO/COLONIC TUBE PERCUT W/FLUORO: IMG2333

## 2017-04-21 MED ORDER — IOPAMIDOL (ISOVUE-300) INJECTION 61%
INTRAVENOUS | Status: AC
  Administered 2017-04-21: 20 mL via INTRAMUSCULAR
  Filled 2017-04-21: qty 50

## 2017-04-21 MED ORDER — LIDOCAINE VISCOUS 2 % MT SOLN
OROMUCOSAL | Status: DC | PRN
  Administered 2017-04-21: 10 mL via OROMUCOSAL

## 2017-04-21 MED ORDER — LIDOCAINE VISCOUS 2 % MT SOLN
OROMUCOSAL | Status: AC
  Administered 2017-04-21: 10 mL
  Filled 2017-04-21: qty 15

## 2017-05-01 IMAGING — RF DG SWALLOWING FUNCTION
11 series · 24 of 24 positions shown · non-contrast
Comparison: None.

CLINICAL DATA: Dysphagia.  Traumatic brain injury.

EXAM:
MODIFIED BARIUM SWALLOW
TECHNIQUE: Different consistencies of barium were administered orally to the
patient by the Speech Pathologist. Imaging of the pharynx was
performed in the lateral projection.
FLUOROSCOPY TIME:  Fluoroscopy Time:  0 minutes 48 seconds

[Series 1: cp_standard · 0.34mm/px · 2 of 33 frames shown (1 of 11)]
[frame 5/33]
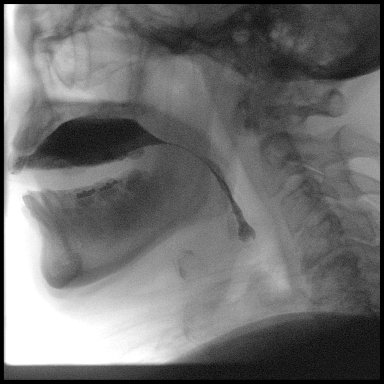
[frame 29/33]
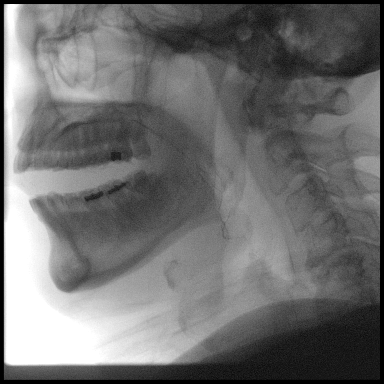

[Series 2: cp_standard · 0.34mm/px · 3 of 46 frames shown (2 of 11)]
[frame 1/46]
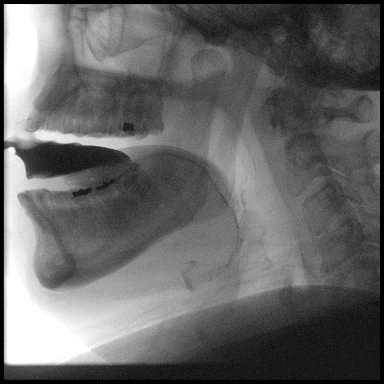
[frame 7/46]
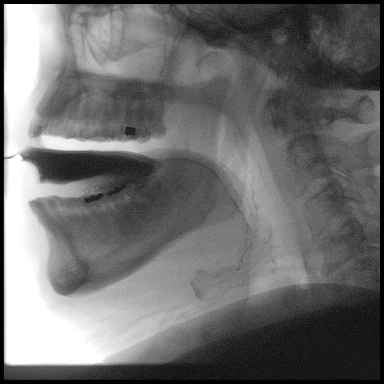
[frame 40/46]
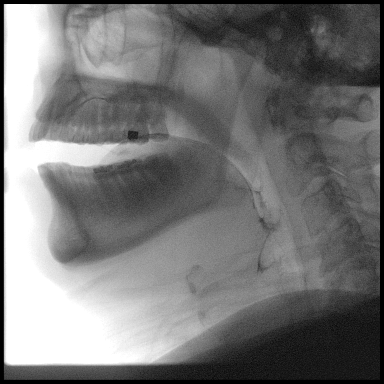

[Series 3: cp_standard · 0.34mm/px · 2 of 41 frames shown (3 of 11)]
[frame 7/41]
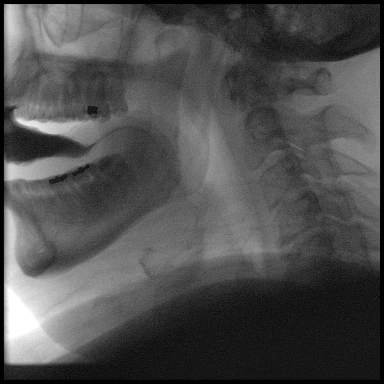
[frame 35/41]
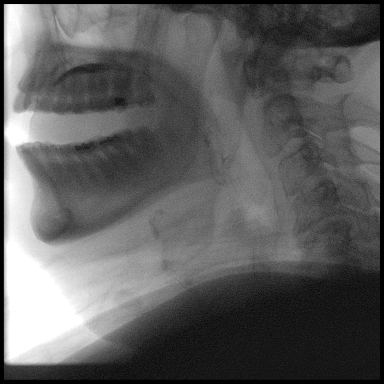

[Series 4: cp_standard · 0.34mm/px · 2 of 42 frames shown (4 of 11)]
[frame 9/42]
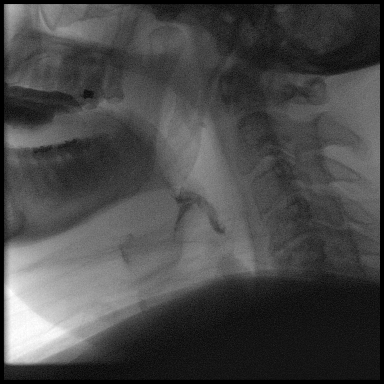
[frame 36/42]
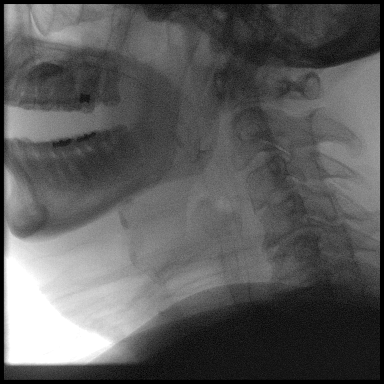

[Series 5: cp_standard · 0.34mm/px · 2 of 117 frames shown (5 of 11)]
[frame 18/117]
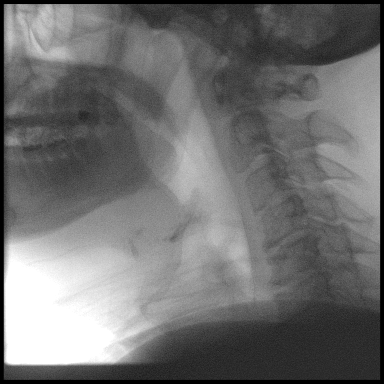
[frame 100/117]
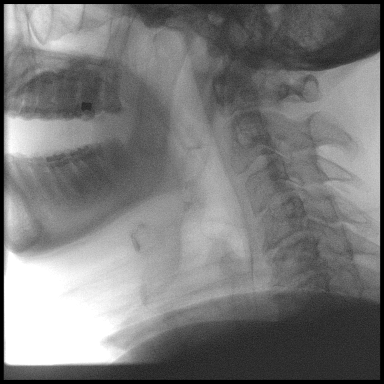

[Series 6: cp_standard · 0.34mm/px · 2 of 43 frames shown (6 of 11)]
[frame 7/43]
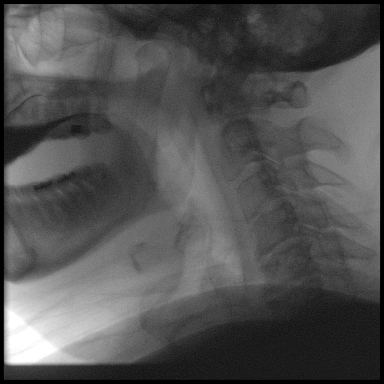
[frame 37/43]
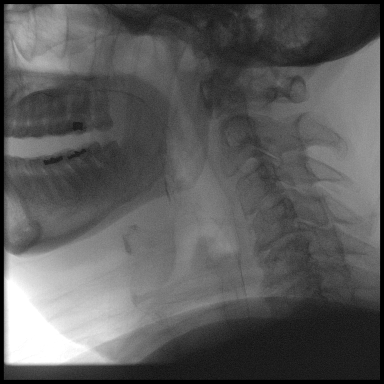

[Series 7: cp_standard · 0.34mm/px · 2 of 51 frames shown (7 of 11)]
[frame 11/51]
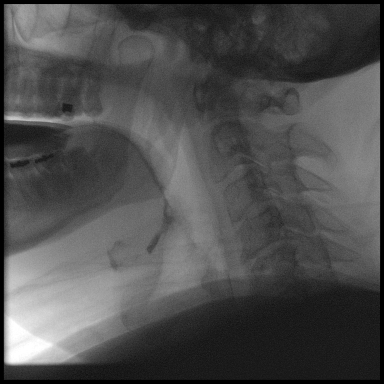
[frame 44/51]
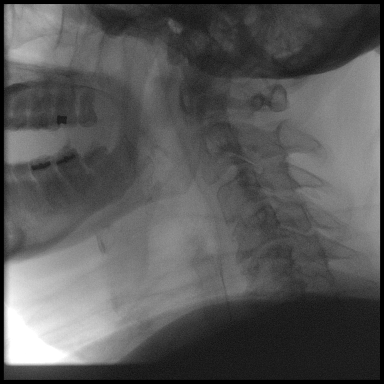

[Series 8: cp_standard · 0.34mm/px · 2 of 40 frames shown (8 of 11)]
[frame 7/40]
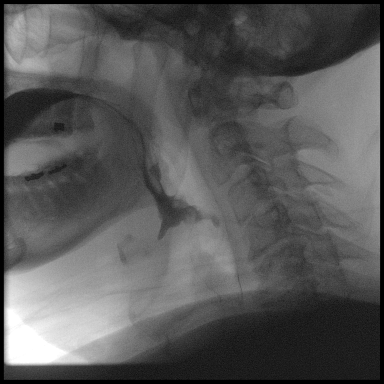
[frame 35/40]
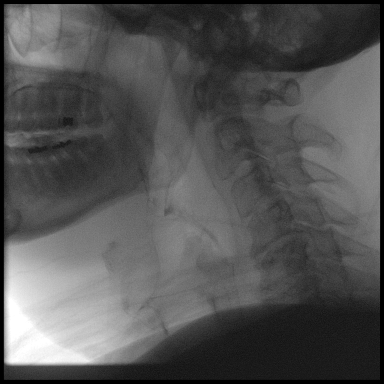

[Series 9: cp_standard · 0.34mm/px · 2 of 46 frames shown (9 of 11)]
[frame 7/46]
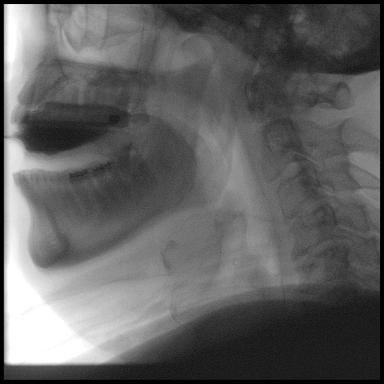
[frame 40/46]
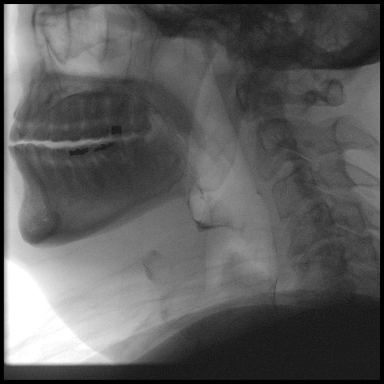

[Series 10: cp_standard · 0.34mm/px · 3 of 76 frames shown (10 of 11)]
[frame 12/76]
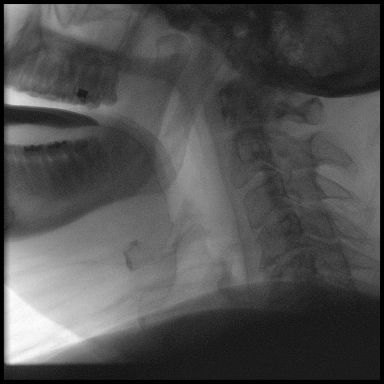
[frame 39/76]
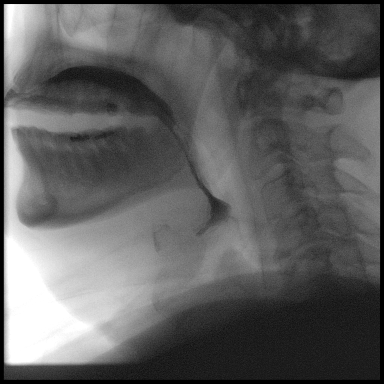
[frame 65/76]
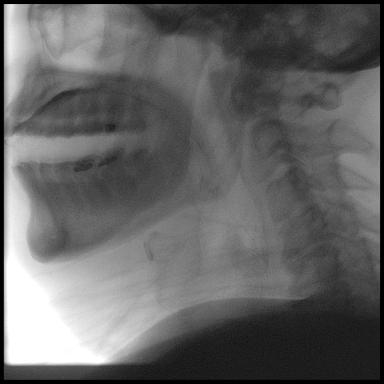

[Series 11: cp_standard · 0.34mm/px · 2 of 54 frames shown (11 of 11)]
[frame 14/54]
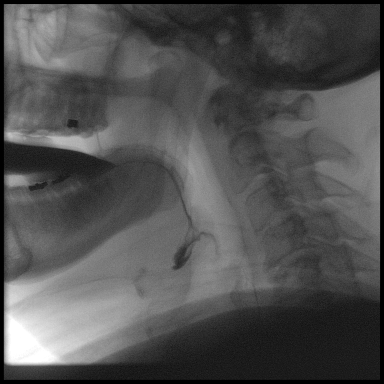
[frame 46/54]
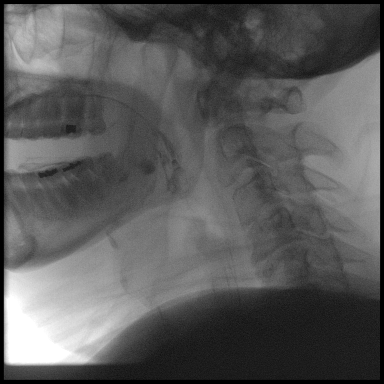

[24 of 24 positions shown; findings below may reference images not displayed]

FINDINGS: Thin liquid- N/A

Nectar thick liquid- aspiration with large sips.

Michelet Glaze/SUA

Jae Wook?Kimsesiz premature spill.  Otherwise normal.

Jae Wook?Kadzasha with cracker- within normal limits
IMPRESSION: Aspiration with large sips of nectar thick liquid. Premature spill
with honey thick liquid.

Please refer to the Speech Pathologists report for complete details
and recommendations.

## 2017-05-24 IMAGING — XA IR REPLACE G-TUBE/COLONIC TUBE
1 series · 1 of 1 positions shown · non-contrast
Comparison: Fluoroscopic guided gastrostomy tube exchange -
07/08/2015

INDICATION: Dislodged gastrostomy tube. Please perform fluoroscopic guided
exchange.

EXAM:
FLUOROSCOPIC GUIDED REPLACEMENT OF GASTROSTOMY TUBE

[Series 1: fl angio · 1 of 1 slices shown]
[im 1/1]
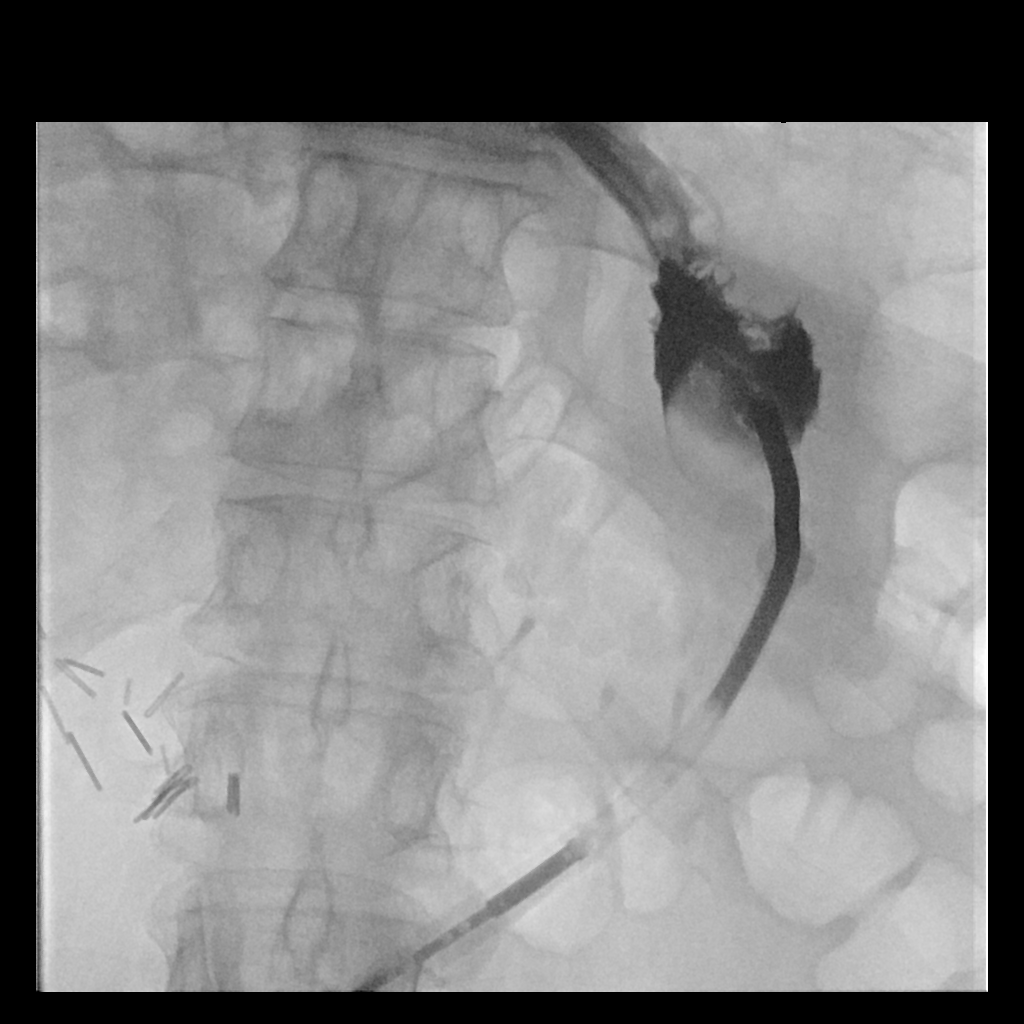

[1 of 1 positions shown; findings below may reference images not displayed]

MEDICATIONS:
None.

CONTRAST:  20 cc Rsovue-J00 - administered into the gastric lumen

FLUOROSCOPY TIME:  54 seconds (48 mGy)

COMPLICATIONS:
None immediate.

PROCEDURE:
Existing gastrostomy tube placed at the outside facility to maintain
tracks patency was injected with a small amount of contrast
demonstrating appropriate positioning.

The external portion of the existing gastrostomy tube was cut and
cannulated with an Amplatz wire.

Under intermittent fluoroscopic guidance, a new 24-Liisi Dikert
balloon inflatable gastrostomy tube. The balloon was inflated and
disc was cinched. Contrast was injected and several spot
fluoroscopic images were obtained in various obliquities to confirm
appropriate intraluminal positioning. A dressing was placed. The
patient tolerated the procedure well without immediate
postprocedural complication.
IMPRESSION: Successful fluoroscopic guided replacement of a new 24-French
gastrostomy tube. The new gastrostomy tube is ready for immediate
use.

## 2017-06-04 ENCOUNTER — Emergency Department (HOSPITAL_COMMUNITY)
Admission: EM | Admit: 2017-06-04 | Discharge: 2017-06-04 | Disposition: A | Discharge status: Skilled Nursing Facility | Payer: Medicare Other | Attending: Emergency Medicine | Admitting: Emergency Medicine

## 2017-06-04 ENCOUNTER — Emergency Department (HOSPITAL_COMMUNITY): Payer: Medicare Other

## 2017-06-04 ENCOUNTER — Encounter (HOSPITAL_COMMUNITY): Payer: Self-pay | Admitting: Emergency Medicine

## 2017-06-04 DIAGNOSIS — Z880 Allergy status to penicillin: Secondary | ICD-10-CM | POA: Diagnosis not present

## 2017-06-04 DIAGNOSIS — Z91040 Latex allergy status: Secondary | ICD-10-CM | POA: Diagnosis not present

## 2017-06-04 DIAGNOSIS — Y939 Activity, unspecified: Secondary | ICD-10-CM | POA: Diagnosis not present

## 2017-06-04 DIAGNOSIS — G931 Anoxic brain damage, not elsewhere classified: Secondary | ICD-10-CM | POA: Diagnosis not present

## 2017-06-04 DIAGNOSIS — S0181XA Laceration without foreign body of other part of head, initial encounter: Secondary | ICD-10-CM

## 2017-06-04 DIAGNOSIS — Z885 Allergy status to narcotic agent status: Secondary | ICD-10-CM | POA: Insufficient documentation

## 2017-06-04 DIAGNOSIS — S0993XA Unspecified injury of face, initial encounter: Secondary | ICD-10-CM | POA: Diagnosis present

## 2017-06-04 DIAGNOSIS — E039 Hypothyroidism, unspecified: Secondary | ICD-10-CM | POA: Insufficient documentation

## 2017-06-04 DIAGNOSIS — Z79899 Other long term (current) drug therapy: Secondary | ICD-10-CM | POA: Insufficient documentation

## 2017-06-04 DIAGNOSIS — W19XXXA Unspecified fall, initial encounter: Secondary | ICD-10-CM | POA: Insufficient documentation

## 2017-06-04 DIAGNOSIS — Y999 Unspecified external cause status: Secondary | ICD-10-CM | POA: Insufficient documentation

## 2017-06-04 DIAGNOSIS — Y92129 Unspecified place in nursing home as the place of occurrence of the external cause: Secondary | ICD-10-CM | POA: Insufficient documentation

## 2017-06-04 LAB — COMPREHENSIVE METABOLIC PANEL
ALT: 14 U/L — ABNORMAL LOW (ref 17–63)
AST: 21 U/L (ref 15–41)
Albumin: 3.3 g/dL — ABNORMAL LOW (ref 3.5–5.0)
Alkaline Phosphatase: 43 U/L (ref 38–126)
Anion gap: 8 (ref 5–15)
BILIRUBIN TOTAL: 0.6 mg/dL (ref 0.3–1.2)
BUN: 10 mg/dL (ref 6–20)
CHLORIDE: 106 mmol/L (ref 101–111)
CO2: 23 mmol/L (ref 22–32)
CREATININE: 0.91 mg/dL (ref 0.61–1.24)
Calcium: 8.8 mg/dL — ABNORMAL LOW (ref 8.9–10.3)
GFR calc Af Amer: >60 mL/min (ref >60)
Glucose, Bld: 76 mg/dL (ref 65–99)
POTASSIUM: 4.1 mmol/L (ref 3.5–5.1)
Sodium: 137 mmol/L (ref 135–145)
Total Protein: 6.4 g/dL — ABNORMAL LOW (ref 6.5–8.1)

## 2017-06-04 LAB — CBC WITH DIFFERENTIAL/PLATELET
Basophils Absolute: 0 10*3/uL (ref 0.0–0.1)
Basophils Relative: 0 %
EOS PCT: 2 %
Eosinophils Absolute: 0.1 10*3/uL (ref 0.0–0.7)
HCT: 37 % — ABNORMAL LOW (ref 39.0–52.0)
Hemoglobin: 12.2 g/dL — ABNORMAL LOW (ref 13.0–17.0)
LYMPHS ABS: 2.7 10*3/uL (ref 0.7–4.0)
LYMPHS PCT: 39 %
MCH: 28.8 pg (ref 26.0–34.0)
MCHC: 33 g/dL (ref 30.0–36.0)
MCV: 87.3 fL (ref 78.0–100.0)
MONO ABS: 0.7 10*3/uL (ref 0.1–1.0)
MONOS PCT: 10 %
Neutro Abs: 3.3 10*3/uL (ref 1.7–7.7)
Neutrophils Relative %: 49 %
PLATELETS: 129 10*3/uL — AB (ref 150–400)
RBC: 4.24 MIL/uL (ref 4.22–5.81)
RDW: 14.2 % (ref 11.5–15.5)
WBC: 6.8 10*3/uL (ref 4.0–10.5)

## 2017-06-04 LAB — VALPROIC ACID LEVEL: VALPROIC ACID LVL: 51 ug/mL (ref 50.0–100.0)

## 2017-06-04 MED ORDER — LIDOCAINE-EPINEPHRINE (PF) 2 %-1:200000 IJ SOLN
10.0000 mL | Freq: Once | INTRAMUSCULAR | Status: AC
  Administered 2017-06-04: 10 mL
  Filled 2017-06-04: qty 20

## 2017-06-04 NOTE — ED Notes (Signed)
Speech still difficult to understand.  Makes good eye contact

## 2017-06-04 NOTE — ED Notes (Signed)
Report called to [facility]   ptar called to transport the pt back  To [facility]

## 2017-06-04 NOTE — ED Notes (Signed)
Pt has been to xray once and c-t  Once now he is sleeping soundly

## 2017-06-04 NOTE — Discharge Instructions (Signed)
Stitches will need to be removed in 4-5 days. Return to the emergency department with any new or concerning injury.

## 2017-06-04 NOTE — ED Notes (Signed)
Laceration cleaned with soap and water dsd dressing applied to his rt eyebrow and the bridge of his nose that is small.  The laceration through his rt eye brow is approx 1-2 inches in length and is deep  Bleeding controlled with dressings

## 2017-06-04 NOTE — ED Notes (Signed)
pts speech is difficult to understand

## 2017-06-04 NOTE — ED Triage Notes (Signed)
[facility] causing laceration to his R eyebrow. Bleeding is controlled at this time. Pt is not sure what happened, fall was unwitnessed. Alert to baseline at this time per EMS report.

## 2017-06-04 NOTE — ED Notes (Signed)
EKG acquired, printed, and given to Delo, MD. Tracing failed to export at this time.

## 2017-06-04 NOTE — ED Notes (Signed)
Patient transported to CT 

## 2017-06-04 NOTE — ED Provider Notes (Signed)
[facility]-EMERGENCY DEPT Provider Note   CSN: [id number] Arrival date & time: 06/04/17  6213     History   Chief Complaint Chief Complaint  Patient presents with  . Fall  . Head Injury    HPI [name] is a 51 y.o. male.  Patient with history of anoxic brain injury, HLD, CAD, glaucoma presents after unwitnessed fall at his skilled nursing facility. The patient cannot give details of fall. He has a laceration to right eyebrow. LOC unknown. No vomiting. He complains of abdominal pain and points to where PEG tube recently removed. Per EMS report, the patient is at his baseline mental status.   The history is provided by the patient. No language interpreter was used.    Past Medical History:  Diagnosis Date  . A-fib (HCC)   . Addison disease (HCC)   . Addison disease (HCC)   . Anoxic brain injury (HCC)   . Anxiety   . Aspiration pneumonia (HCC)   . Atrial fibrillation (HCC)   . Chronic constipation 11/23/2011  . Dysphagia   . Dysphagia   . Encephalopathy   . Glaucoma   . Glucocorticoid deficiency (HCC)   . History of recurrent UTIs   . Hyperlipemia   . Hypothyroidism   . Mental disorder   . Myocardial infarction (HCC)   . Pneumoperitoneum of unknown etiology 12/21/2011  . Quadriplegia (HCC)   . Reflux   . Seizure disorder [facility])     Patient Active Problem List   Diagnosis Date Noted  . Agitation 07/03/2014  . Cardiomyopathy, ischemic 06/18/2014  . 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 Acute psychosis 04/19/2014  . Unspecified hypothyroidism 05/16/2013  . Constipation 05/16/2013  . Anemia of other chronic disease 05/16/2013  . Mental disorder 01/03/2013  . Addisons disease (HCC) 01/03/2013  . PEG (percutaneous endoscopic gastrostomy) adjustment/replacement/removal (HCC) 12/21/2011  . Hypokalemia 11/25/2011  . Thrombocytopenia (HCC) 11/25/2011  . FTT (failure to thrive) in adult 11/25/2011  . Grand mal seizure (HCC) 11/23/2011  . Depression (emotion) 11/23/2011    . History of atrial fibrillation 11/23/2011  . Other specified hemorrhagic conditions (HCC) 11/23/2011  . Glaucoma 11/23/2011  . Hypoglycemia 11/23/2011  . Chronic constipation 11/23/2011  . Anoxic brain injury (HCC) 11/12/2011  . Protein calorie malnutrition (HCC) 11/12/2011  . Hyperchloremia 11/12/2011  . Hyperthyroidism 11/12/2011  . UTI (lower urinary tract infection) 11/12/2011  . Dysphagia     Past Surgical History:  Procedure Laterality Date  . IR GENERIC HISTORICAL  10/20/2016   IR REPLC GASTRO/COLONIC TUBE PERCUT W/FLUORO 10/20/2016 Oley Balm, MD [facility]-INTERV RAD  . IR REPLC GASTRO/COLONIC TUBE PERCUT W/FLUORO  04/21/2017  . NEPHRECTOMY  unknown  . PEG TUBE PLACEMENT         Home Medications    Prior to Admission medications   Medication Sig Start Date End Date Taking? Authorizing Provider  acetaminophen (TYLENOL) 325 MG tablet Place 650 mg into feeding tube every 4 (four) hours as needed for mild pain.    [provider]  divalproex (DEPAKOTE SPRINKLE) 125 MG capsule Take 250-500 mg by mouth 3 (three) times daily. Take 4 capsules (500 mg) via peg tube twice daily (9am and 9pm) and 2 capsules (250 mg) via peg tube daily at noon 07/18/13   [provider]  haloperidol lactate (HALDOL) 5 MG/ML injection Inject 2 mg into the muscle every 6 (six) hours as needed.    [provider]  levothyroxine (SYNTHROID, LEVOTHROID) 150 MCG tablet Place 150 mcg into feeding tube  daily before breakfast.    [provider]  loratadine (CLARITIN) 10 MG tablet 10 mg by PEG Tube route daily.     [provider]  METOPROLOL SUCCINATE ER PO 12.5 mg by PEG Tube route 2 (two) times daily. Metoprolol tartrate 6.25 mg/5 ml suspension    [provider]  Multiple Vitamins-Minerals (CERTAVITE/ANTIOXIDANTS) LIQD 15 mLs by PEG Tube route daily at 12 noon.     [provider]  Nutritional Supplements (VITAL 1.5 CAL PO) Take 237 mLs by mouth  as needed (For less than 50% food consumption.).    [provider]  pantoprazole sodium (PROTONIX) 40 mg/20 mL PACK 40 mg by PEG Tube route daily at 12 noon.  11/30/11   [name], MD  PARoxetine (PAXIL) 10 MG tablet 10 mg by PEG Tube route daily.     [provider]  polyethylene glycol (MIRALAX / GLYCOLAX) packet 17 g by PEG Tube route 2 (two) times daily. Mix 17gm  In 4-8 ounces of liquid    [provider]  polyvinyl alcohol (LIQUIFILM TEARS) 1.4 % ophthalmic solution Place 1 drop into both eyes 4 (four) times daily. 9am, 2pm, 6pm, 9pm    [provider]  risperiDONE (RISPERDAL) 0.5 MG tablet 1 mg by PEG Tube route 2 (two) times daily.  04/13/15   [provider]  sennosides (SENOKOT) 8.8 MG/5ML syrup Take 15 mLs by mouth 2 (two) times daily.    [provider]  traZODone (DESYREL) 50 MG tablet Take 25-50 mg by mouth See admin instructions. Take 1/2 tablet (25 mg) by mouth twice daily at 8am and 2pm, may also take 1 tablet (50 mg) 3 additional times daily as needed for anxiety 08/02/13   [provider]    Family History History reviewed. No pertinent family history.  Social History Social History  Substance Use Topics  . Smoking status: Never Smoker  . Smokeless tobacco: Never Used  . Alcohol use No     Allergies   Ativan [lorazepam]; Latex; Morphine and related; Penicillins; Pyridium [phenazopyridine hcl]; and Soy allergy   Review of Systems Review of Systems  Unable to perform ROS: Patient nonverbal     Physical Exam Updated Vital Signs BP (!) 113/91   Pulse 69   Temp 97.5 F (36.4 C) (Oral)   Resp 17   SpO2 97%   Physical Exam  Constitutional: He appears well-developed and well-nourished.  HENT:  Head: Normocephalic.  Eyes: Pupils are equal, round, and reactive to light.  Neck: Normal range of motion.  Pulmonary/Chest: Effort normal.  Abdominal: Soft.  Abdomen is soft, nondistended. PEG site  bandaged with clean gauze. No surrounding erythema.   Musculoskeletal: Normal range of motion.  Neurological: He is alert.  Skin: Skin is warm and dry.  Multiple bruises that appear aged on extremities and anterior chest wall.   There is a 3 cm laceration to right medial eyebrow and a superficial abrasion to right nose associated with mild swelling. Previous but not active epistaxis.  Psychiatric: He has a normal mood and affect.     ED Treatments / Results  Labs (all labs ordered are listed, but only abnormal results are displayed) Labs Reviewed  CBC WITH DIFFERENTIAL/PLATELET - Abnormal; Notable for the following:       Result Value   Hemoglobin 12.2 (*)    HCT 37.0 (*)    Platelets 129 (*)    All other components within normal limits  COMPREHENSIVE METABOLIC PANEL -  Abnormal; Notable for the following:    Calcium 8.8 (*)    Total Protein 6.4 (*)    Albumin 3.3 (*)    ALT 14 (*)    All other components within normal limits  VALPROIC ACID LEVEL    EKG  EKG Interpretation None       Radiology Ct Head Wo Contrast  Result Date: 06/04/2017 CLINICAL DATA:  Patient fell onto forehead. Neck and nose pain. Headache. EXAM: CT HEAD WITHOUT CONTRAST CT CERVICAL SPINE WITHOUT CONTRAST TECHNIQUE: Multidetector CT imaging of the head and cervical spine was performed following the standard protocol without intravenous contrast. Multiplanar CT image reconstructions of the cervical spine were also generated. COMPARISON:  03/15/2017 FINDINGS: CT HEAD FINDINGS Brain: Mild superficial and central atrophy beyond expected for age. No acute intracranial hemorrhage, midline shift or edema. Minimal small vessel ischemic changes of periventricular white matter. No large vascular territory infarct. No intra-axial mass nor extra-axial fluid collections. Vascular: No hyperdense vessels. Mild atherosclerosis internal carotid artery is at cavernous sinus. Skull: No acute skull fracture nor primary osseous  lesions. Sinuses/Orbits: Right nasal bone fracture without significant displacement. Intact orbits. Other: Right forehead laceration with associated mild soft tissue contusion. CT CERVICAL SPINE FINDINGS Alignment: Slight reversal cervical lordosis which may be due to muscle spasm or positioning. Skull base and vertebrae: No skullbase fracture. Craniocervical relationship is maintained. The atlantodental interval is osteoarthritic. Soft tissues and spinal canal: No prevertebral soft tissue swelling. No intraspinal hematoma. Disc levels: Cervical spondylitic disc space narrowing from C3 through C7 with small posterior marginal osteophytes, bilateral neural foraminal encroachment and bilateral uncovertebral joint osteoarthritis. Upper chest: Normal Other: None IMPRESSION: 1. Right supraorbital soft tissue laceration and mild scalp contusion. 2. Non displaced right nasal bone fracture. 3. Cerebral atrophy with chronic small vessel ischemia. 4. Cervical spondylosis. No acute cervical spine fracture or posttraumatic subluxation. Electronically Signed   By: Tollie Ethavid  Kwon M.D.   On: 06/04/2017 03:39   Ct Cervical Spine Wo Contrast  Result Date: 06/04/2017 CLINICAL DATA:  Patient fell onto forehead. Neck and nose pain. Headache. EXAM: CT HEAD WITHOUT CONTRAST CT CERVICAL SPINE WITHOUT CONTRAST TECHNIQUE: Multidetector CT imaging of the head and cervical spine was performed following the standard protocol without intravenous contrast. Multiplanar CT image reconstructions of the cervical spine were also generated. COMPARISON:  03/15/2017 FINDINGS: CT HEAD FINDINGS Brain: Mild superficial and central atrophy beyond expected for age. No acute intracranial hemorrhage, midline shift or edema. Minimal small vessel ischemic changes of periventricular white matter. No large vascular territory infarct. No intra-axial mass nor extra-axial fluid collections. Vascular: No hyperdense vessels. Mild atherosclerosis internal carotid artery  is at cavernous sinus. Skull: No acute skull fracture nor primary osseous lesions. Sinuses/Orbits: Right nasal bone fracture without significant displacement. Intact orbits. Other: Right forehead laceration with associated mild soft tissue contusion. CT CERVICAL SPINE FINDINGS Alignment: Slight reversal cervical lordosis which may be due to muscle spasm or positioning. Skull base and vertebrae: No skullbase fracture. Craniocervical relationship is maintained. The atlantodental interval is osteoarthritic. Soft tissues and spinal canal: No prevertebral soft tissue swelling. No intraspinal hematoma. Disc levels: Cervical spondylitic disc space narrowing from C3 through C7 with small posterior marginal osteophytes, bilateral neural foraminal encroachment and bilateral uncovertebral joint osteoarthritis. Upper chest: Normal Other: None IMPRESSION: 1. Right supraorbital soft tissue laceration and mild scalp contusion. 2. Non displaced right nasal bone fracture. 3. Cerebral atrophy with chronic small vessel ischemia. 4. Cervical spondylosis. No acute cervical spine fracture or posttraumatic  subluxation. Electronically Signed   By: Tollie Eth M.D.   On: 06/04/2017 03:39    Procedures Procedures (including critical care time) LACERATION REPAIR Performed by: Elpidio Anis A Authorized by: Elpidio Anis A Consent: Verbal consent obtained. Risks and benefits: risks, benefits and alternatives were discussed Consent given by: patient Patient identity confirmed: provided demographic data Prepped and Draped in normal sterile fashion Wound explored  Laceration Location: right eyebrow  Laceration Length: 3 cm  No Foreign Bodies seen or palpated  Anesthesia: local infiltration  Local anesthetic: lidocaine 2% w/epinephrine  Anesthetic total: 2 ml  Irrigation method: syringe Amount of cleaning: standard  Skin closure: 6-0 prolene  Number of sutures: 5  Technique: simple interrupted  Patient  tolerance: Patient tolerated the procedure well with no immediate complications.  Medications Ordered in ED Medications  lidocaine-EPINEPHrine (XYLOCAINE W/EPI) 2 %-1:200000 (PF) injection 10 mL (10 mLs Infiltration Given 06/04/17 0438)     Initial Impression / Assessment and Plan / ED Course  I have reviewed the triage vital signs and the nursing notes.  Pertinent labs & imaging results that were available during my care of the patient were reviewed by me and considered in my medical decision making (see chart for details).     Patient from nursing home reported to be at baseline mental status presents after unwitnessed fall causing facial laceration. Laceration repaired as per above note.   He is observed throughout the night without change in mental status. CT head and neck are negative. Patient's evaluation is limited by history of anoxic brain injury and poor interaction and mental status normally. He is not felt to have intracranial injury.   He can be discharged [facility] to [facility] with laceration care instructions.  Final Clinical Impressions(s) / ED Diagnoses   Final diagnoses:  None   1. Fall 2. Facial laceration  New Prescriptions New Prescriptions   No medications on file     Elpidio Anis, Cordelia Poche 06/04/17 1308    Geoffery Lyons, MD 06/04/17 [phone]

## 2017-06-07 ENCOUNTER — Emergency Department (HOSPITAL_COMMUNITY)
Admission: EM | Admit: 2017-06-07 | Discharge: 2017-06-07 | Disposition: A | Discharge status: Skilled Nursing Facility | Payer: Medicare Other | Attending: Emergency Medicine | Admitting: Emergency Medicine

## 2017-06-07 ENCOUNTER — Encounter (HOSPITAL_COMMUNITY): Payer: Self-pay | Admitting: Emergency Medicine

## 2017-06-07 ENCOUNTER — Emergency Department (HOSPITAL_COMMUNITY): Payer: Medicare Other

## 2017-06-07 DIAGNOSIS — S161XXA Strain of muscle, fascia and tendon at neck level, initial encounter: Secondary | ICD-10-CM | POA: Diagnosis not present

## 2017-06-07 DIAGNOSIS — Z79899 Other long term (current) drug therapy: Secondary | ICD-10-CM | POA: Diagnosis not present

## 2017-06-07 DIAGNOSIS — E039 Hypothyroidism, unspecified: Secondary | ICD-10-CM | POA: Insufficient documentation

## 2017-06-07 DIAGNOSIS — S0990XA Unspecified injury of head, initial encounter: Secondary | ICD-10-CM | POA: Diagnosis not present

## 2017-06-07 DIAGNOSIS — Z91040 Latex allergy status: Secondary | ICD-10-CM | POA: Diagnosis not present

## 2017-06-07 DIAGNOSIS — S01511A Laceration without foreign body of lip, initial encounter: Secondary | ICD-10-CM

## 2017-06-07 DIAGNOSIS — Y929 Unspecified place or not applicable: Secondary | ICD-10-CM | POA: Diagnosis not present

## 2017-06-07 DIAGNOSIS — Y939 Activity, unspecified: Secondary | ICD-10-CM | POA: Diagnosis not present

## 2017-06-07 DIAGNOSIS — I252 Old myocardial infarction: Secondary | ICD-10-CM | POA: Diagnosis not present

## 2017-06-07 DIAGNOSIS — W050XXA Fall from non-moving wheelchair, initial encounter: Secondary | ICD-10-CM | POA: Diagnosis not present

## 2017-06-07 DIAGNOSIS — Y999 Unspecified external cause status: Secondary | ICD-10-CM | POA: Insufficient documentation

## 2017-06-07 DIAGNOSIS — W19XXXA Unspecified fall, initial encounter: Secondary | ICD-10-CM

## 2017-06-07 MED ORDER — LIDOCAINE HCL 2 % IJ SOLN
20.0000 mL | Freq: Once | INTRAMUSCULAR | Status: DC
  Filled 2017-06-07: qty 20

## 2017-06-07 NOTE — ED Triage Notes (Signed)
Pt reports pain to right side of body at this time. EMS has towel roll applied for spinal stabilization.

## 2017-06-07 NOTE — ED Notes (Signed)
Pt. Was found sitting on the edge/tip of the bed, confused. Assisted back to bed safely. Reoriented, safety and comfort measures applied.

## 2017-06-07 NOTE — ED Notes (Signed)
[facility]. Discharge instructions and follow up care reviewed with the nurse. Nurse verbalized understanding

## 2017-06-07 NOTE — ED Provider Notes (Signed)
[facility]-EMERGENCY DEPT Provider Note   CSN: [id number] Arrival date & time: 06/07/17  0057     History   Chief Complaint Chief Complaint  Patient presents with  . Fall    HPI [name] is a 51 y.o. male.  HPI Patient presents to the emergency department with injuries following a fall.  The patient fell out of his wheelchair and has a laceration to the lip and hit his head and is going neck pain.  Patient states he has not have any other injuriesThe patient denies chest pain, shortness of breath, headache,blurred vision,fever, cough, weakness, numbness, dizziness, anorexia, edema, abdominal pain, nausea, vomiting, diarrhea, rash, back pain, dysuria, hematemesis, bloody stool, near syncope, or syncope. Past Medical History:  Diagnosis Date  . A-fib (HCC)   . Addison disease (HCC)   . Addison disease (HCC)   . Anoxic brain injury (HCC)   . Anxiety   . Aspiration pneumonia (HCC)   . Atrial fibrillation (HCC)   . Chronic constipation 11/23/2011  . Dysphagia   . Dysphagia   . Encephalopathy   . Glaucoma   . Glucocorticoid deficiency (HCC)   . History of recurrent UTIs   . Hyperlipemia   . Hypothyroidism   . Mental disorder   . Myocardial infarction (HCC)   . Pneumoperitoneum of unknown etiology 12/21/2011  . Quadriplegia (HCC)   . Reflux   . Seizure disorder [facility])     Patient Active Problem List   Diagnosis Date Noted  . Agitation 07/03/2014  . Cardiomyopathy, ischemic 06/18/2014  . 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 Acute psychosis 04/19/2014  . Unspecified hypothyroidism 05/16/2013  . Constipation 05/16/2013  . Anemia of other chronic disease 05/16/2013  . Mental disorder 01/03/2013  . Addisons disease (HCC) 01/03/2013  . PEG (percutaneous endoscopic gastrostomy) adjustment/replacement/removal (HCC) 12/21/2011  . Hypokalemia 11/25/2011  . Thrombocytopenia (HCC) 11/25/2011  . FTT (failure to thrive) in adult 11/25/2011  . Grand mal seizure (HCC) 11/23/2011  .  Depression (emotion) 11/23/2011  . History of atrial fibrillation 11/23/2011  . Other specified hemorrhagic conditions (HCC) 11/23/2011  . Glaucoma 11/23/2011  . Hypoglycemia 11/23/2011  . Chronic constipation 11/23/2011  . Anoxic brain injury (HCC) 11/12/2011  . Protein calorie malnutrition (HCC) 11/12/2011  . Hyperchloremia 11/12/2011  . Hyperthyroidism 11/12/2011  . UTI (lower urinary tract infection) 11/12/2011  . Dysphagia     Past Surgical History:  Procedure Laterality Date  . IR GENERIC HISTORICAL  10/20/2016   IR REPLC GASTRO/COLONIC TUBE PERCUT W/FLUORO 10/20/2016 Oley Balm, MD [facility]-INTERV RAD  . IR REPLC GASTRO/COLONIC TUBE PERCUT W/FLUORO  04/21/2017  . NEPHRECTOMY  unknown  . PEG TUBE PLACEMENT         Home Medications    Prior to Admission medications   Medication Sig Start Date End Date Taking? Authorizing Provider  acetaminophen (TYLENOL) 325 MG tablet Place 650 mg into feeding tube every 4 (four) hours as needed for mild pain.   Yes [provider]  divalproex (DEPAKOTE SPRINKLE) 125 MG capsule Take 500 mg by mouth 3 (three) times daily.  07/18/13  Yes [provider]  levothyroxine (SYNTHROID, LEVOTHROID) 175 MCG tablet Take 175 mcg by mouth daily before breakfast. 05/20/17  Yes [provider]  loratadine (CLARITIN) 10 MG tablet 10 mg by PEG Tube route daily.    Yes [provider]  METOPROLOL SUCCINATE ER PO 12.5 mg by PEG Tube route 2 (two) times daily. Metoprolol tartrate 6.25 mg/5 ml suspension   Yes [provider]  Multiple Vitamins-Minerals (CERTAVITE/ANTIOXIDANTS) LIQD 15 mLs by PEG Tube route daily at 12 noon.    Yes [provider]  Nutritional Supplements (VITAL 1.5 CAL PO) Take 237 mLs by mouth as needed (For less than 50% food consumption.).   Yes [provider]  pantoprazole sodium (PROTONIX) 40 mg/20 mL PACK 40 mg by PEG Tube route daily at 12 noon.  11/30/11  Yes Alinda Moneyavis, Novlet J, MD    PARoxetine (PAXIL) 20 MG tablet Take 20 mg by mouth daily. 06/03/17  Yes [provider]  polyethylene glycol (MIRALAX / GLYCOLAX) packet 17 g by PEG Tube route 2 (two) times daily. Mix 17gm  In 4-8 ounces of liquid   Yes [provider]  polyvinyl alcohol (LIQUIFILM TEARS) 1.4 % ophthalmic solution Place 1 drop into both eyes 4 (four) times daily. 9am, 2pm, 6pm, 9pm   Yes [provider]  risperiDONE (RISPERDAL M-TABS) 2 MG disintegrating tablet Place 2 mg under the tongue 2 (two) times daily. 05/08/17  Yes [provider]  sennosides (SENOKOT) 8.8 MG/5ML syrup Take 15 mLs by mouth 2 (two) times daily.   Yes [provider]  traZODone (DESYREL) 50 MG tablet Take 50 mg by mouth 2 (two) times daily.  08/02/13  Yes [provider]    Family History History reviewed. No pertinent family history.  Social History Social History  Substance Use Topics  . Smoking status: Never Smoker  . Smokeless tobacco: Never Used  . Alcohol use No     Allergies   Ativan [lorazepam]; Latex; Morphine and related; Penicillins; Pyridium [phenazopyridine hcl]; and Soy allergy   Review of Systems Review of Systems All other systems negative except as documented in the HPI. All pertinent positives and negatives as reviewed in the HPI.  Physical Exam Updated Vital Signs BP 124/82 (BP Location: Left Arm)   Pulse 91   Temp 98.6 F (37 C) (Oral)   Resp 18   Ht 5\' 11"  (1.803 m)   Wt 124.7 kg (275 lb)   SpO2 95%   BMI 38.35 kg/m   Physical Exam  Constitutional: He is oriented to person, place, and time. He appears well-developed and well-nourished. No distress.  HENT:  Head: Normocephalic.  Mouth/Throat: Oropharynx is clear and moist.    Eyes: Pupils are equal, round, and reactive to light.  Neck: Normal range of motion. Neck supple.  Cardiovascular: Normal rate, regular rhythm and normal heart sounds.  Exam reveals no gallop and no friction rub.    No murmur heard. Pulmonary/Chest: Effort normal and breath sounds normal. No respiratory distress. He has no wheezes.  Abdominal: Soft. Bowel sounds are normal. He exhibits no distension. There is no tenderness.  Musculoskeletal:       Cervical back: He exhibits tenderness and pain. He exhibits normal range of motion, no bony tenderness, no swelling and no spasm.       Back:  Neurological: He is alert and oriented to person, place, and time. He exhibits normal muscle tone. Coordination normal.  Skin: Skin is warm and dry. Capillary refill takes less than 2 seconds. No rash noted. No erythema.  Psychiatric: He has a normal mood and affect. His behavior is normal.  Nursing note and vitals reviewed.    ED Treatments / Results  Labs (all labs ordered are listed, but only abnormal results are displayed) Labs Reviewed - No data to display  EKG  EKG Interpretation None       Radiology Ct Head  Wo Contrast  Result Date: 06/07/2017 CLINICAL DATA:  Initial evaluation for acute trauma, fall. EXAM: CT HEAD WITHOUT CONTRAST CT CERVICAL SPINE WITHOUT CONTRAST TECHNIQUE: Multidetector CT imaging of the head and cervical spine was performed following the standard protocol without intravenous contrast. Multiplanar CT image reconstructions of the cervical spine were also generated. COMPARISON:  Prior CT from 06/04/2017. FINDINGS: CT HEAD FINDINGS Brain: Stable atrophy with chronic small vessel ischemic disease. No acute intracranial hemorrhage. No evidence for acute large vessel territory infarct. No mass lesion, midline shift or mass effect. No hydrocephalus. No extra-axial fluid collection. Vascular: No hyperdense vessel. Skull: Persistent mild soft tissue swelling related to recent right supraorbital soft tissue laceration. No other new soft tissue abnormality about the scalp. Calvarium intact. Sinuses/Orbits: Globes and orbital soft tissues within normal limits. Paranasal sinuses are clear. No  mastoid effusion. CT CERVICAL SPINE FINDINGS Alignment: Reversal of the normal cervical lordosis. The no listhesis. Skull base and vertebrae: Skullbase intact. Normal C1-2 articulations preserved. Dens intact. Vertebral body heights maintained. No acute fracture. Soft tissues and spinal canal: Visualized soft tissues of the neck demonstrate no acute abnormality. No prevertebral edema. Disc levels: Moderate degenerate spondylolysis at C4-5 through C6-7. Upper chest: Visualized upper chest demonstrates no acute abnormality. No apical pneumothorax. IMPRESSION: CT BRAIN: 1. No acute intracranial process. 2. Evolving right supraorbital soft tissue contusion/laceration, seen on recent CT from 06/04/2017. 3. Stable atrophy with chronic small vessel ischemic disease. CT CERVICAL SPINE: 1. No acute traumatic injury within cervical spine. 2. Moderate degenerative spondylolysis at C4-5 through C6-7. Electronically Signed   By: Rise Mu M.D.   On: 06/07/2017 03:10   Ct Cervical Spine Wo Contrast  Result Date: 06/07/2017 CLINICAL DATA:  Initial evaluation for acute trauma, fall. EXAM: CT HEAD WITHOUT CONTRAST CT CERVICAL SPINE WITHOUT CONTRAST TECHNIQUE: Multidetector CT imaging of the head and cervical spine was performed following the standard protocol without intravenous contrast. Multiplanar CT image reconstructions of the cervical spine were also generated. COMPARISON:  Prior CT from 06/04/2017. FINDINGS: CT HEAD FINDINGS Brain: Stable atrophy with chronic small vessel ischemic disease. No acute intracranial hemorrhage. No evidence for acute large vessel territory infarct. No mass lesion, midline shift or mass effect. No hydrocephalus. No extra-axial fluid collection. Vascular: No hyperdense vessel. Skull: Persistent mild soft tissue swelling related to recent right supraorbital soft tissue laceration. No other new soft tissue abnormality about the scalp. Calvarium intact. Sinuses/Orbits: Globes and orbital  soft tissues within normal limits. Paranasal sinuses are clear. No mastoid effusion. CT CERVICAL SPINE FINDINGS Alignment: Reversal of the normal cervical lordosis. The no listhesis. Skull base and vertebrae: Skullbase intact. Normal C1-2 articulations preserved. Dens intact. Vertebral body heights maintained. No acute fracture. Soft tissues and spinal canal: Visualized soft tissues of the neck demonstrate no acute abnormality. No prevertebral edema. Disc levels: Moderate degenerate spondylolysis at C4-5 through C6-7. Upper chest: Visualized upper chest demonstrates no acute abnormality. No apical pneumothorax. IMPRESSION: CT BRAIN: 1. No acute intracranial process. 2. Evolving right supraorbital soft tissue contusion/laceration, seen on recent CT from 06/04/2017. 3. Stable atrophy with chronic small vessel ischemic disease. CT CERVICAL SPINE: 1. No acute traumatic injury within cervical spine. 2. Moderate degenerative spondylolysis at C4-5 through C6-7. Electronically Signed   By: Rise Mu M.D.   On: 06/07/2017 03:10    Procedures Procedures (including critical care time)  Medications Ordered in ED Medications  lidocaine (XYLOCAINE) 2 % (with pres) injection 400 mg (not administered)     Initial Impression /  Assessment and Plan / ED Course  I have reviewed the triage vital signs and the nursing notes.  Pertinent labs & imaging results that were available during my care of the patient were reviewed by me and considered in my medical decision making (see chart for details).     LACERATION REPAIR Performed by: Carlyle Dolly Authorized by: Carlyle Dolly Consent: Verbal consent obtained. Risks and benefits: risks, benefits and alternatives were discussed Consent given by: patient Patient identity confirmed: provided demographic data Prepped and Draped in normal sterile fashion Wound explored  Laceration Location: R lower lip  Laceration Length: 3cm  No Foreign  Bodies seen or palpated  Anesthesia: local infiltration  Local anesthetic: lidocaine 2% wo epinephrine  Anesthetic total: 3 ml  Irrigation method: syringe Amount of cleaning: standard  Skin closure: 6-0 Prolene  Number of sutures: 6  Technique: simple interrupted  Patient tolerance: Patient tolerated the procedure well with no immediate complications.   Final Clinical Impressions(s) / ED Diagnoses   Final diagnoses:  None    New Prescriptions New Prescriptions   No medications on file     Charlestine Night, PA-C 06/07/17 0631    Charlestine Night, PA-C 06/07/17 1914    Pricilla Loveless, MD 06/09/17 [phone]

## 2017-06-07 NOTE — ED Notes (Signed)
[facility] called for transportation  

## 2017-06-07 NOTE — Discharge Instructions (Signed)
Return here as needed.  Follow-up with your primary doctor, have sutures out in 3-5 days

## 2017-06-07 NOTE — ED Triage Notes (Signed)
[facility] flipping wheel chair on side at appx [phone] tonight. Pt has controlled bleeding from right lower lip and nare. Bleeding noted around right eyebrow where prior sutures were placed from fall on 06/04/17

## 2017-06-07 NOTE — ED Notes (Signed)
Bed: [id number] Expected date:  Expected time:  Means of arrival:  Comments: Fall-nursing home

## 2017-06-07 NOTE — ED Notes (Signed)
Patient transported to CT 

## 2017-07-07 IMAGING — CT CT HEAD W/O CM
3 of 7 series · 14 of 47 positions shown, 17 images · non-contrast
Comparison: 03/15/2017

CLINICAL DATA: Patient fell onto forehead. Neck and nose pain.
Headache.

EXAM:
CT HEAD WITHOUT CONTRAST
CT CERVICAL SPINE WITHOUT CONTRAST
TECHNIQUE: Multidetector CT imaging of the head and cervical spine was
performed following the standard protocol without intravenous
contrast. Multiplanar CT image reconstructions of the cervical spine
were also generated.

[Series 6: head 3.0 mpr cor · coronal · 0.37mm/px · 3 of 74 slices shown]
[im 15/74  brain]
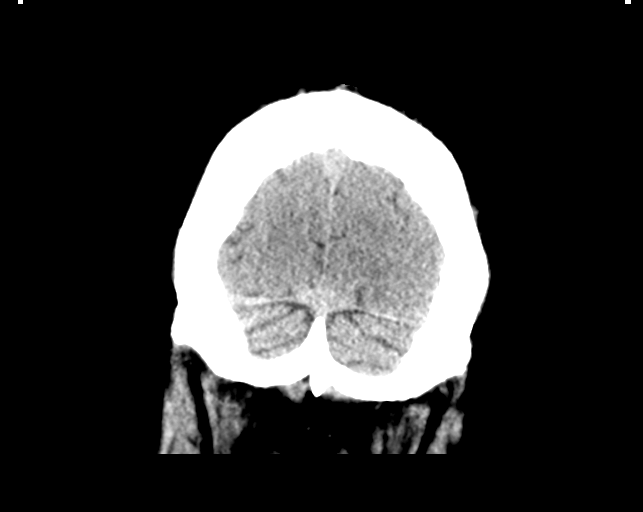
[im 30/74  brain]
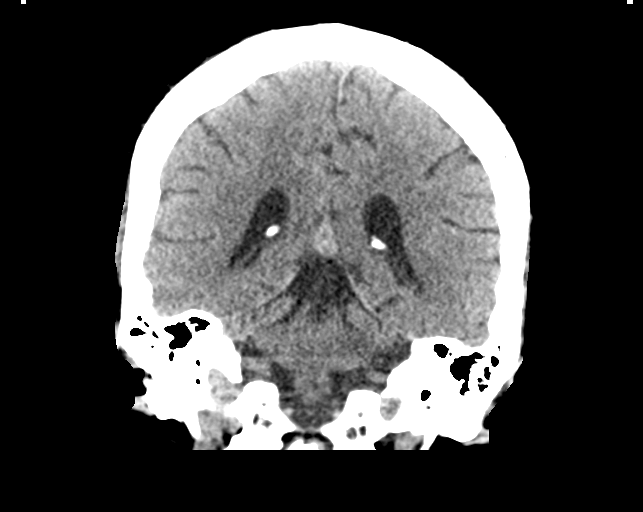
[im 44/74  brain]
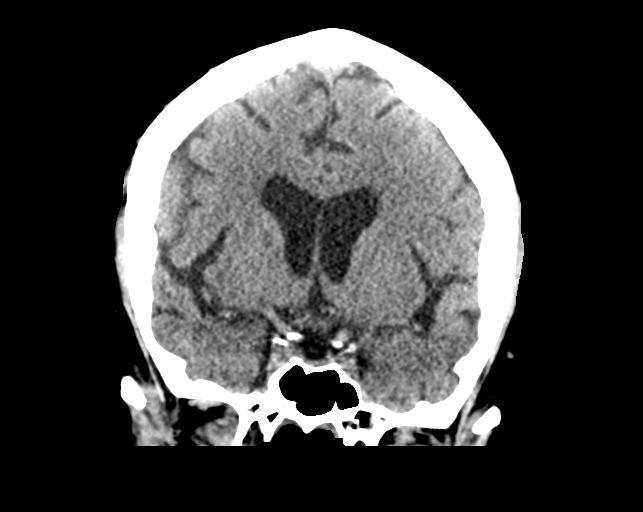

[Series 7: head 3.0 mpr sag · sagittal · 0.35mm/px · 1 of 57 slices shown]
[im 29/57  brain]
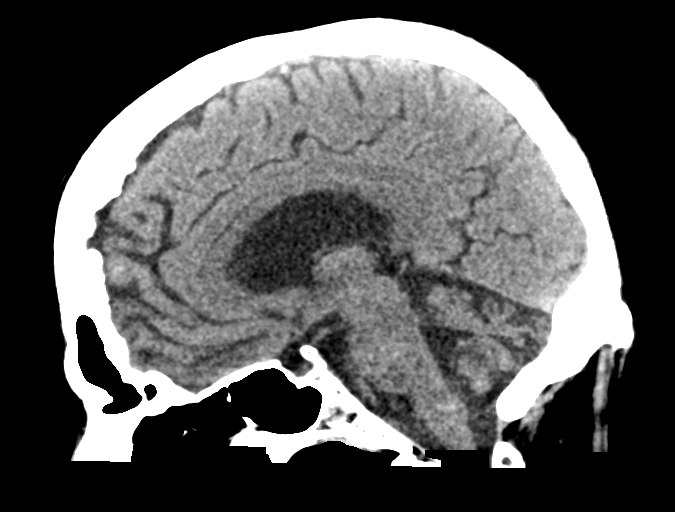

[Series 13: orthogonal axial st · axial · 0.21mm/px · z∈[-264,-145]mm · 10 of 81 slices shown, 13 images]
[im 8/81  brain]
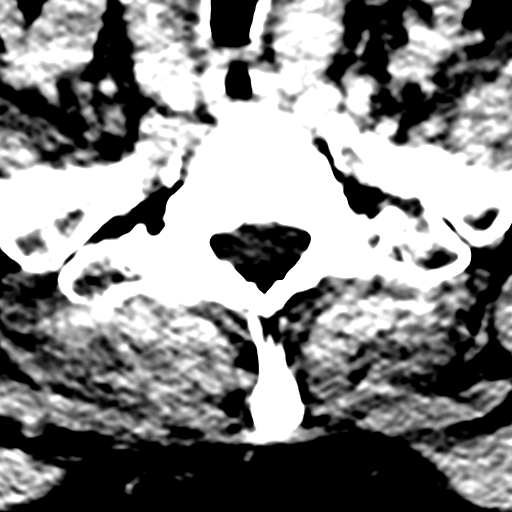
[im 8/81  bone]
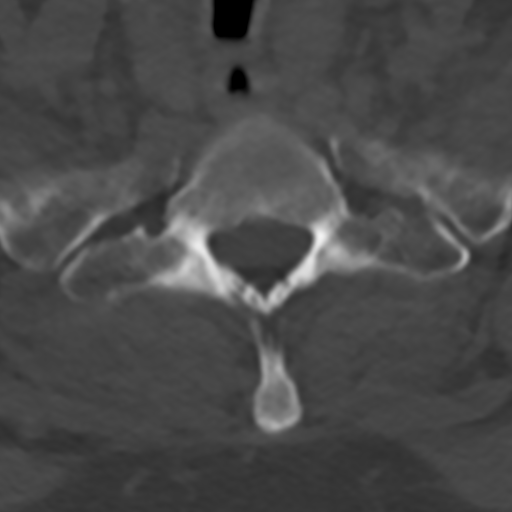
[im 15/81  brain]
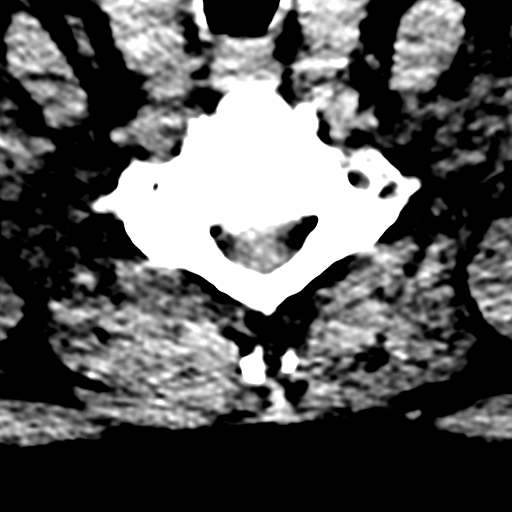
[im 22/81  brain]
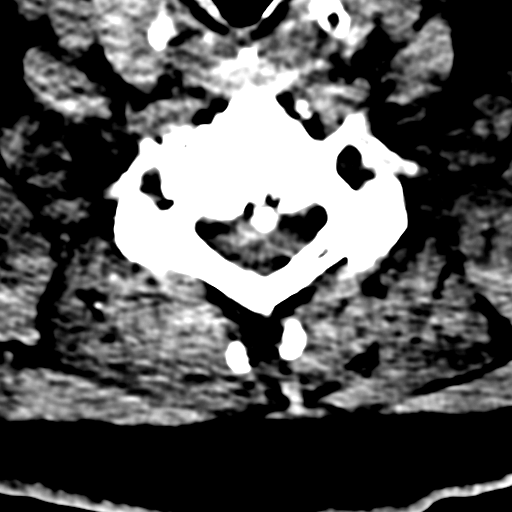
[im 30/81  brain]
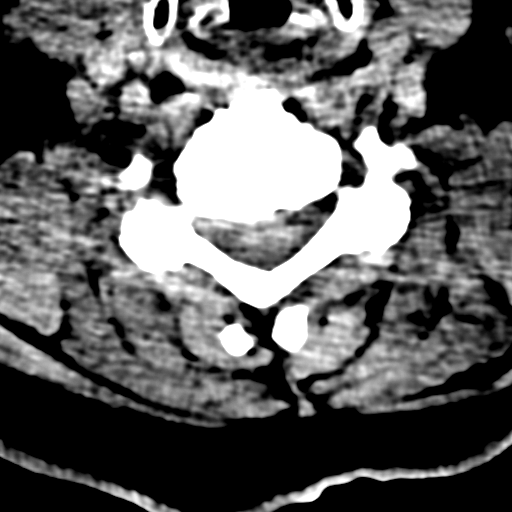
[im 37/81  brain]
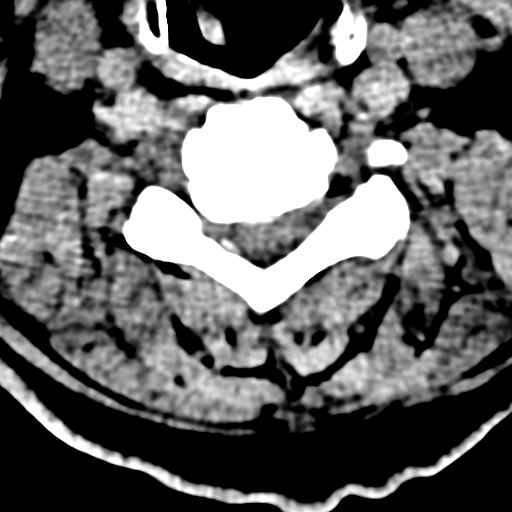
[im 37/81  bone]
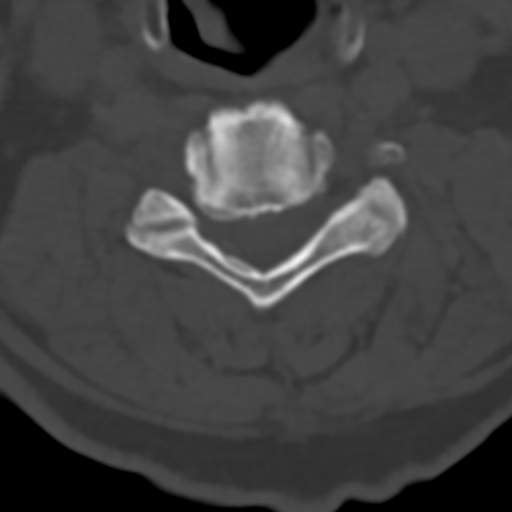
[im 44/81  brain]
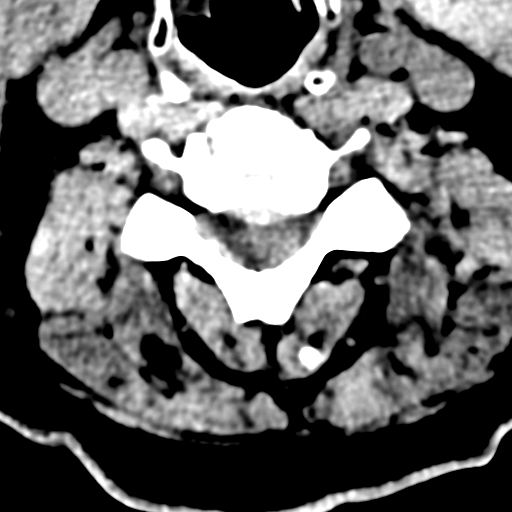
[im 51/81  brain]
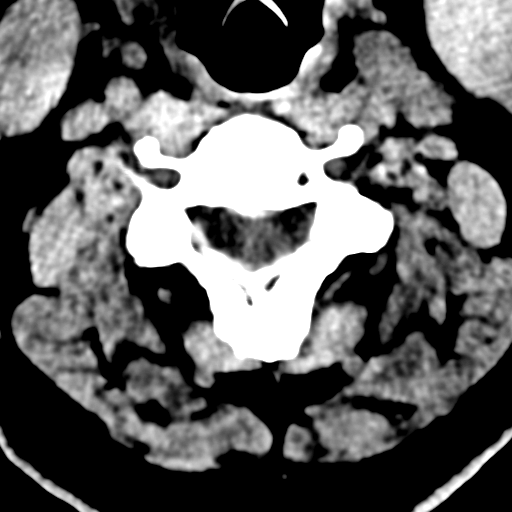
[im 59/81  brain]
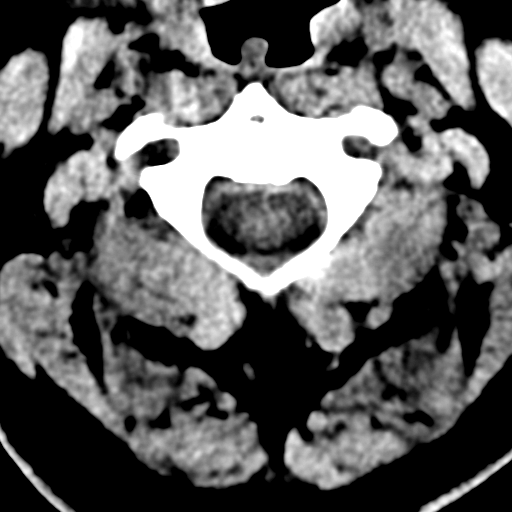
[im 66/81  brain]
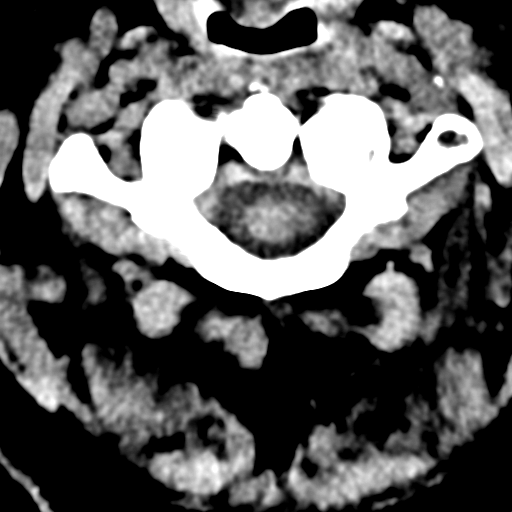
[im 66/81  bone]
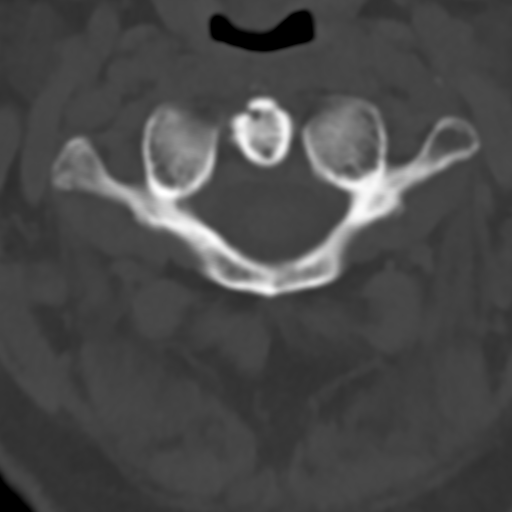
[im 73/81  brain]
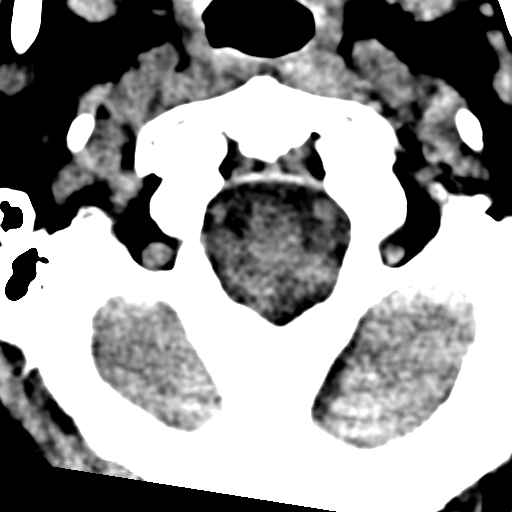

[14 of 47 positions shown; findings below may reference images not displayed]

FINDINGS: CT HEAD FINDINGS

Brain: Mild superficial and central atrophy beyond expected for age.
No acute intracranial hemorrhage, midline shift or edema. Minimal
small vessel ischemic changes of periventricular white matter. No
large vascular territory infarct. No intra-axial mass nor
extra-axial fluid collections.

Vascular: No hyperdense vessels. Mild atherosclerosis internal
carotid artery is at cavernous sinus.

Skull: No acute skull fracture nor primary osseous lesions.

Sinuses/Orbits: Right nasal bone fracture without significant
displacement. Intact orbits.

Other: Right forehead laceration with associated mild soft tissue
contusion.

CT CERVICAL SPINE FINDINGS

Alignment: Slight reversal cervical lordosis which may be due to
muscle spasm or positioning.

Skull base and vertebrae: No skullbase fracture. Craniocervical
relationship is maintained. The atlantodental interval is
osteoarthritic.

Soft tissues and spinal canal: No prevertebral soft tissue swelling.
No intraspinal hematoma.

Disc levels: Cervical spondylitic disc space narrowing from C3
through C7 with small posterior marginal osteophytes, bilateral
neural foraminal encroachment and bilateral uncovertebral joint
osteoarthritis.

Upper chest: Normal

Other: None
IMPRESSION: 1. Right supraorbital soft tissue laceration and mild scalp
contusion.
2. Non displaced right nasal bone fracture.
3. Cerebral atrophy with chronic small vessel ischemia.
4. Cervical spondylosis. No acute cervical spine fracture or
posttraumatic subluxation.

## 2017-07-21 ENCOUNTER — Other Ambulatory Visit (HOSPITAL_COMMUNITY): Payer: Self-pay | Admitting: Internal Medicine

## 2017-07-21 DIAGNOSIS — R1310 Dysphagia, unspecified: Secondary | ICD-10-CM

## 2017-07-22 ENCOUNTER — Ambulatory Visit (HOSPITAL_COMMUNITY)
Admission: RE | Admit: 2017-07-22 | Discharge: 2017-07-22 | Disposition: A | Discharge status: Home / Self Care | Payer: Medicare Other | Source: Ambulatory Visit | Attending: Internal Medicine | Admitting: Internal Medicine

## 2017-07-22 ENCOUNTER — Encounter (HOSPITAL_COMMUNITY): Payer: Self-pay | Admitting: Interventional Radiology

## 2017-07-22 DIAGNOSIS — Z87820 Personal history of traumatic brain injury: Secondary | ICD-10-CM | POA: Diagnosis not present

## 2017-07-22 DIAGNOSIS — R1310 Dysphagia, unspecified: Secondary | ICD-10-CM

## 2017-07-22 DIAGNOSIS — Y733 Surgical instruments, materials and gastroenterology and urology devices (including sutures) associated with adverse incidents: Secondary | ICD-10-CM | POA: Diagnosis not present

## 2017-07-22 DIAGNOSIS — K9423 Gastrostomy malfunction: Secondary | ICD-10-CM | POA: Diagnosis present

## 2017-07-22 HISTORY — PX: IR REPLC GASTRO/COLONIC TUBE PERCUT W/FLUORO: IMG2333

## 2017-07-22 MED ORDER — IOPAMIDOL (ISOVUE-300) INJECTION 61%
INTRAVENOUS | Status: AC
  Administered 2017-07-22: 15 mL
  Filled 2017-07-22: qty 50

## 2017-08-24 IMAGING — XA IR REPLACE G-TUBE/COLONIC TUBE
1 series · 2 of 2 positions shown · non-contrast
Comparison: none

INDICATION: Anoxic brain injury, long-term gastrostomy tube has become occluded.

[Series 2: fl (-) angio · 2 of 2 slices shown]
[im 1/2]
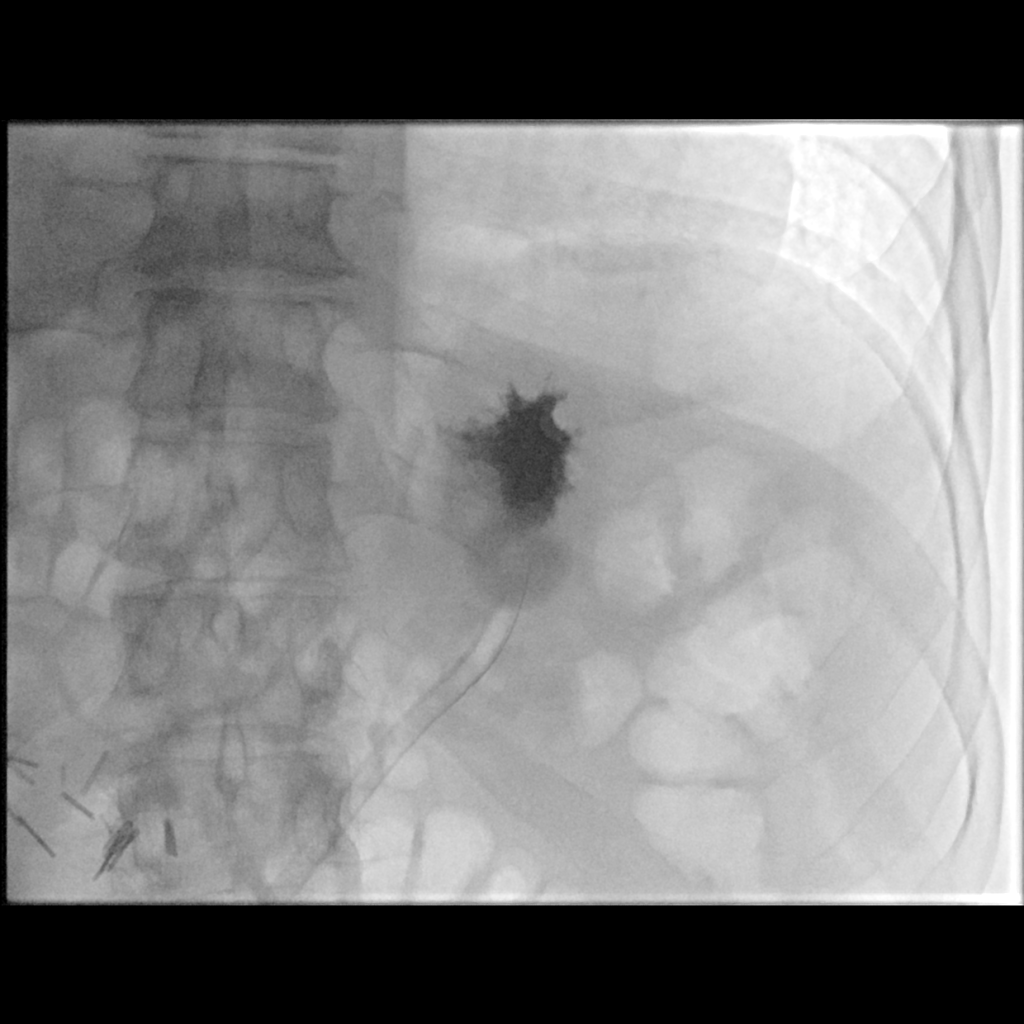
[im 2/2]
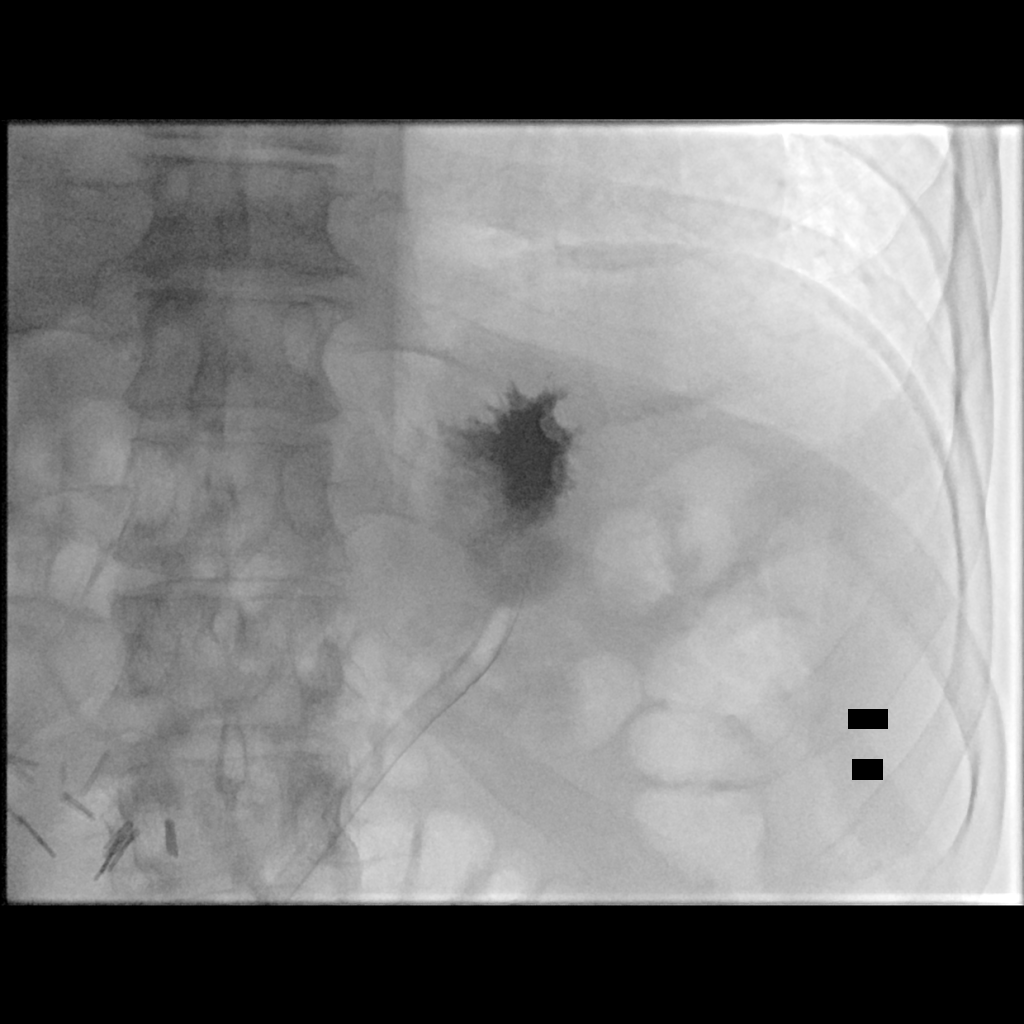

[2 of 2 positions shown; findings below may reference images not displayed]

EXAM:
GASTROSTOMY CATHETER REPLACEMENT

MEDICATIONS:
None indicated

ANESTHESIA/SEDATION:
None required

PROCEDURE:
The previously placed gastrostomy catheter could not be cleared with
forceful injection and mechanical maneuvers. The retention balloon
was deflated and the gastrostomy tube was removed. A new 24 French
balloon retention gastrostomy catheter was advanced through the
mature gastrostomy tract into the gastric lumen. The balloon was
inflated. Contrast injection confirmed appropriate intraluminal
placement. The external bumper was applied. The patient tolerated
the procedure well.

CONTRAST:  15mL [id number] IOPAMIDOL ([id number]) INJECTION 61% -
administered into the gastric lumen.

FLUOROSCOPY TIME:  0.5 minute, 100  [id number] DAP

COMPLICATIONS:
None immediate.
IMPRESSION: 1. Technically successful exchange of 24 French balloon retention
gastrostomy catheter, okay for routine use.

## 2017-10-02 ENCOUNTER — Encounter (HOSPITAL_COMMUNITY): Payer: Self-pay

## 2017-10-02 ENCOUNTER — Emergency Department (HOSPITAL_COMMUNITY): Payer: Medicare Other

## 2017-10-02 ENCOUNTER — Inpatient Hospital Stay (HOSPITAL_COMMUNITY)
Admission: EM | Admit: 2017-10-02 | Discharge: 2017-10-07 | DRG: 562 | Disposition: A | Discharge status: Skilled Nursing Facility | Payer: Medicare Other | Attending: Internal Medicine | Admitting: Internal Medicine

## 2017-10-02 DIAGNOSIS — Z79899 Other long term (current) drug therapy: Secondary | ICD-10-CM

## 2017-10-02 DIAGNOSIS — W19XXXA Unspecified fall, initial encounter: Secondary | ICD-10-CM

## 2017-10-02 DIAGNOSIS — R1310 Dysphagia, unspecified: Secondary | ICD-10-CM | POA: Diagnosis present

## 2017-10-02 DIAGNOSIS — K219 Gastro-esophageal reflux disease without esophagitis: Secondary | ICD-10-CM | POA: Diagnosis present

## 2017-10-02 DIAGNOSIS — S42291A Other displaced fracture of upper end of right humerus, initial encounter for closed fracture: Secondary | ICD-10-CM | POA: Diagnosis present

## 2017-10-02 DIAGNOSIS — F419 Anxiety disorder, unspecified: Secondary | ICD-10-CM | POA: Diagnosis present

## 2017-10-02 DIAGNOSIS — L02211 Cutaneous abscess of abdominal wall: Secondary | ICD-10-CM | POA: Diagnosis present

## 2017-10-02 DIAGNOSIS — Z23 Encounter for immunization: Secondary | ICD-10-CM

## 2017-10-02 DIAGNOSIS — E039 Hypothyroidism, unspecified: Secondary | ICD-10-CM | POA: Diagnosis present

## 2017-10-02 DIAGNOSIS — M25511 Pain in right shoulder: Secondary | ICD-10-CM | POA: Diagnosis not present

## 2017-10-02 DIAGNOSIS — G8250 Quadriplegia, unspecified: Secondary | ICD-10-CM | POA: Diagnosis present

## 2017-10-02 DIAGNOSIS — I252 Old myocardial infarction: Secondary | ICD-10-CM

## 2017-10-02 DIAGNOSIS — Z87440 Personal history of urinary (tract) infections: Secondary | ICD-10-CM

## 2017-10-02 DIAGNOSIS — I4891 Unspecified atrial fibrillation: Secondary | ICD-10-CM | POA: Diagnosis present

## 2017-10-02 DIAGNOSIS — K5909 Other constipation: Secondary | ICD-10-CM | POA: Diagnosis present

## 2017-10-02 DIAGNOSIS — Z8679 Personal history of other diseases of the circulatory system: Secondary | ICD-10-CM

## 2017-10-02 DIAGNOSIS — R627 Adult failure to thrive: Secondary | ICD-10-CM | POA: Diagnosis present

## 2017-10-02 DIAGNOSIS — S42211A Unspecified displaced fracture of surgical neck of right humerus, initial encounter for closed fracture: Secondary | ICD-10-CM | POA: Diagnosis not present

## 2017-10-02 DIAGNOSIS — E271 Primary adrenocortical insufficiency: Secondary | ICD-10-CM | POA: Diagnosis present

## 2017-10-02 DIAGNOSIS — E272 Addisonian crisis: Secondary | ICD-10-CM | POA: Diagnosis present

## 2017-10-02 DIAGNOSIS — D62 Acute posthemorrhagic anemia: Secondary | ICD-10-CM | POA: Diagnosis present

## 2017-10-02 DIAGNOSIS — Z91018 Allergy to other foods: Secondary | ICD-10-CM

## 2017-10-02 DIAGNOSIS — Z880 Allergy status to penicillin: Secondary | ICD-10-CM

## 2017-10-02 DIAGNOSIS — N390 Urinary tract infection, site not specified: Secondary | ICD-10-CM | POA: Diagnosis present

## 2017-10-02 DIAGNOSIS — D638 Anemia in other chronic diseases classified elsewhere: Secondary | ICD-10-CM | POA: Diagnosis present

## 2017-10-02 DIAGNOSIS — Z91040 Latex allergy status: Secondary | ICD-10-CM

## 2017-10-02 DIAGNOSIS — Z87820 Personal history of traumatic brain injury: Secondary | ICD-10-CM

## 2017-10-02 DIAGNOSIS — Z905 Acquired absence of kidney: Secondary | ICD-10-CM

## 2017-10-02 DIAGNOSIS — W06XXXA Fall from bed, initial encounter: Secondary | ICD-10-CM | POA: Diagnosis present

## 2017-10-02 DIAGNOSIS — Z885 Allergy status to narcotic agent status: Secondary | ICD-10-CM

## 2017-10-02 DIAGNOSIS — N3000 Acute cystitis without hematuria: Secondary | ICD-10-CM | POA: Diagnosis present

## 2017-10-02 DIAGNOSIS — G40909 Epilepsy, unspecified, not intractable, without status epilepticus: Secondary | ICD-10-CM | POA: Diagnosis present

## 2017-10-02 DIAGNOSIS — R109 Unspecified abdominal pain: Secondary | ICD-10-CM

## 2017-10-02 DIAGNOSIS — Z888 Allergy status to other drugs, medicaments and biological substances status: Secondary | ICD-10-CM

## 2017-10-02 DIAGNOSIS — B9620 Unspecified Escherichia coli [E. coli] as the cause of diseases classified elsewhere: Secondary | ICD-10-CM | POA: Diagnosis present

## 2017-10-02 DIAGNOSIS — E785 Hyperlipidemia, unspecified: Secondary | ICD-10-CM | POA: Diagnosis present

## 2017-10-02 DIAGNOSIS — H409 Unspecified glaucoma: Secondary | ICD-10-CM | POA: Diagnosis present

## 2017-10-02 DIAGNOSIS — A419 Sepsis, unspecified organism: Secondary | ICD-10-CM | POA: Diagnosis present

## 2017-10-02 DIAGNOSIS — Y92122 Bedroom in nursing home as the place of occurrence of the external cause: Secondary | ICD-10-CM

## 2017-10-02 DIAGNOSIS — D696 Thrombocytopenia, unspecified: Secondary | ICD-10-CM | POA: Diagnosis present

## 2017-10-02 DIAGNOSIS — N179 Acute kidney failure, unspecified: Secondary | ICD-10-CM | POA: Diagnosis present

## 2017-10-02 DIAGNOSIS — G931 Anoxic brain damage, not elsewhere classified: Secondary | ICD-10-CM | POA: Diagnosis present

## 2017-10-02 LAB — URINALYSIS, ROUTINE W REFLEX MICROSCOPIC
Bilirubin Urine: NEGATIVE
GLUCOSE, UA: NEGATIVE mg/dL
Ketones, ur: 5 mg/dL — AB
Nitrite: POSITIVE — AB
PH: 5 (ref 5.0–8.0)
Protein, ur: 100 mg/dL — AB
SPECIFIC GRAVITY, URINE: 1.02 (ref 1.005–1.030)
Squamous Epithelial / LPF: NONE SEEN

## 2017-10-02 LAB — CBC WITH DIFFERENTIAL/PLATELET
BASOS ABS: 0 10*3/uL (ref 0.0–0.1)
Basophils Relative: 0 %
EOS ABS: 0 10*3/uL (ref 0.0–0.7)
Eosinophils Relative: 0 %
HCT: 35.9 % — ABNORMAL LOW (ref 39.0–52.0)
HEMOGLOBIN: 11.8 g/dL — AB (ref 13.0–17.0)
LYMPHS PCT: 17 %
Lymphs Abs: 2.1 10*3/uL (ref 0.7–4.0)
MCH: 28.2 pg (ref 26.0–34.0)
MCHC: 32.9 g/dL (ref 30.0–36.0)
MCV: 85.7 fL (ref 78.0–100.0)
MONO ABS: 1.5 10*3/uL — AB (ref 0.1–1.0)
Monocytes Relative: 12 %
Neutro Abs: 8.6 10*3/uL — ABNORMAL HIGH (ref 1.7–7.7)
Neutrophils Relative %: 71 %
PLATELETS: 118 10*3/uL — AB (ref 150–400)
RBC: 4.19 MIL/uL — AB (ref 4.22–5.81)
RDW: 14.9 % (ref 11.5–15.5)
WBC: 12.2 10*3/uL — AB (ref 4.0–10.5)

## 2017-10-02 LAB — COMPREHENSIVE METABOLIC PANEL
ALBUMIN: 3.5 g/dL (ref 3.5–5.0)
ALK PHOS: 62 U/L (ref 38–126)
ALT: 7 U/L — AB (ref 17–63)
AST: 20 U/L (ref 15–41)
Anion gap: 11 (ref 5–15)
BUN: 17 mg/dL (ref 6–20)
CHLORIDE: 103 mmol/L (ref 101–111)
CO2: 25 mmol/L (ref 22–32)
CREATININE: 1.53 mg/dL — AB (ref 0.61–1.24)
Calcium: 8.9 mg/dL (ref 8.9–10.3)
GFR calc Af Amer: 60 mL/min — ABNORMAL LOW (ref >60)
GFR calc non Af Amer: 52 mL/min — ABNORMAL LOW (ref >60)
GLUCOSE: 94 mg/dL (ref 65–99)
Potassium: 4.6 mmol/L (ref 3.5–5.1)
SODIUM: 139 mmol/L (ref 135–145)
Total Bilirubin: 0.7 mg/dL (ref 0.3–1.2)
Total Protein: 6.9 g/dL (ref 6.5–8.1)

## 2017-10-02 LAB — PROTIME-INR
INR: 1.04
Prothrombin Time: 13.5 seconds (ref 11.4–15.2)

## 2017-10-02 LAB — I-STAT CG4 LACTIC ACID, ED: Lactic Acid, Venous: 1.76 mmol/L (ref 0.5–1.9)

## 2017-10-02 MED ORDER — FENTANYL CITRATE (PF) 100 MCG/2ML IJ SOLN
50.0000 ug | Freq: Once | INTRAMUSCULAR | Status: AC
  Administered 2017-10-02: 50 ug via INTRAVENOUS
  Filled 2017-10-02: qty 2

## 2017-10-02 MED ORDER — DEXTROSE 5 % IV SOLN
1.0000 g | Freq: Once | INTRAVENOUS | Status: DC
  Filled 2017-10-02: qty 1

## 2017-10-02 MED ORDER — SODIUM CHLORIDE 0.9 % IV BOLUS (SEPSIS)
1000.0000 mL | Freq: Once | INTRAVENOUS | Status: AC
  Administered 2017-10-02: 1000 mL via INTRAVENOUS

## 2017-10-02 NOTE — ED Triage Notes (Signed)
Per EMS, pt from [facility].  Pt fell out of bed last night.  Xray done today and confirmed "dislocation" per verbal to EMS.  However, papers state humerus fracture.  Pt is alert.  Hx of TBI.  Slow verbal communication and altered mental.  Pt does not ambulate.  Pt has peg tube.  Vitals: bp 90/40, hr 100, resp 18, 99% ra, cbg 128.

## 2017-10-02 NOTE — ED Notes (Signed)
Bed: ZO10 Expected date:  Expected time:  Means of arrival:  Comments: Shoulder dislocation

## 2017-10-02 NOTE — ED Provider Notes (Signed)
[facility]-EMERGENCY DEPT Provider Note   CSN: [id number] Arrival date & time: 10/02/17  1552     History   Chief Complaint Chief Complaint  Patient presents with  . Dislocation    HPI [name] is a 51 y.o. male.  The history is provided by the patient, the EMS personnel and medical records. The history is limited by the condition of the patient. No language interpreter was used.  Arm Injury   This is a new problem. The current episode started 12 to 24 hours ago. The problem occurs constantly. The problem has not changed since onset.The pain is present in the right arm. The quality of the pain is described as aching. The pain is at a severity of 8/10. The pain is severe. Associated symptoms include limited range of motion. Pertinent negatives include no numbness and no stiffness. He has tried nothing for the symptoms. The treatment provided no relief. There has been a history of trauma (fall our of bed).    Past Medical History:  Diagnosis Date  . A-fib (HCC)   . Addison disease (HCC)   . Addison disease (HCC)   . Anoxic brain injury (HCC)   . Anxiety   . Aspiration pneumonia (HCC)   . Atrial fibrillation (HCC)   . Chronic constipation 11/23/2011  . Dysphagia   . Dysphagia   . Encephalopathy   . Glaucoma   . Glucocorticoid deficiency (HCC)   . History of recurrent UTIs   . Hyperlipemia   . Hypothyroidism   . Mental disorder   . Myocardial infarction (HCC)   . Pneumoperitoneum of unknown etiology 12/21/2011  . Quadriplegia (HCC)   . Reflux   . Seizure disorder [facility])     Patient Active Problem List   Diagnosis Date Noted  . Agitation 07/03/2014  . Cardiomyopathy, ischemic 06/18/2014  . 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 Acute psychosis (HCC) 04/19/2014  . Unspecified hypothyroidism 05/16/2013  . Constipation 05/16/2013  . Anemia of other chronic disease 05/16/2013  . Mental disorder 01/03/2013  . Addisons disease (HCC) 01/03/2013  . PEG (percutaneous  endoscopic gastrostomy) adjustment/replacement/removal (HCC) 12/21/2011  . Hypokalemia 11/25/2011  . Thrombocytopenia (HCC) 11/25/2011  . FTT (failure to thrive) in adult 11/25/2011  . Grand mal seizure (HCC) 11/23/2011  . Depression (emotion) 11/23/2011  . History of atrial fibrillation 11/23/2011  . Other specified hemorrhagic conditions (HCC) 11/23/2011  . Glaucoma 11/23/2011  . Hypoglycemia 11/23/2011  . Chronic constipation 11/23/2011  . Anoxic brain injury (HCC) 11/12/2011  . Protein calorie malnutrition (HCC) 11/12/2011  . Hyperchloremia 11/12/2011  . Hyperthyroidism 11/12/2011  . UTI (lower urinary tract infection) 11/12/2011  . Dysphagia     Past Surgical History:  Procedure Laterality Date  . IR GENERIC HISTORICAL  10/20/2016   IR REPLC GASTRO/COLONIC TUBE PERCUT W/FLUORO 10/20/2016 Oley Balm, MD [facility]-INTERV RAD  . IR REPLC GASTRO/COLONIC TUBE PERCUT W/FLUORO  04/21/2017  . IR REPLC GASTRO/COLONIC TUBE PERCUT W/FLUORO  07/22/2017  . NEPHRECTOMY  unknown  . PEG TUBE PLACEMENT         Home Medications    Prior to Admission medications   Medication Sig Start Date End Date Taking? Authorizing Provider  acetaminophen (TYLENOL) 325 MG tablet Place 650 mg into feeding tube every 4 (four) hours as needed for mild pain.    [provider]  divalproex (DEPAKOTE SPRINKLE) 125 MG capsule Take 500 mg by mouth 3 (three) times daily.  07/18/13   [provider]  levothyroxine (SYNTHROID, LEVOTHROID) 175 MCG  tablet Take 175 mcg by mouth daily before breakfast. 05/20/17   [provider]  loratadine (CLARITIN) 10 MG tablet 10 mg by PEG Tube route daily.     [provider]  METOPROLOL SUCCINATE ER PO 12.5 mg by PEG Tube route 2 (two) times daily. Metoprolol tartrate 6.25 mg/5 ml suspension    [provider]  Multiple Vitamins-Minerals (CERTAVITE/ANTIOXIDANTS) LIQD 15 mLs by PEG Tube route daily at 12 noon.     [provider]    Nutritional Supplements (VITAL 1.5 CAL PO) Take 237 mLs by mouth as needed (For less than 50% food consumption.).    [provider]  pantoprazole sodium (PROTONIX) 40 mg/20 mL PACK 40 mg by PEG Tube route daily at 12 noon.  11/30/11   [name], MD  PARoxetine (PAXIL) 20 MG tablet Take 20 mg by mouth daily. 06/03/17   [provider]  polyethylene glycol (MIRALAX / GLYCOLAX) packet 17 g by PEG Tube route 2 (two) times daily. Mix 17gm  In 4-8 ounces of liquid    [provider]  polyvinyl alcohol (LIQUIFILM TEARS) 1.4 % ophthalmic solution Place 1 drop into both eyes 4 (four) times daily. 9am, 2pm, 6pm, 9pm    [provider]  risperiDONE (RISPERDAL M-TABS) 2 MG disintegrating tablet Place 2 mg under the tongue 2 (two) times daily. 05/08/17   [provider]  sennosides (SENOKOT) 8.8 MG/5ML syrup Take 15 mLs by mouth 2 (two) times daily.    [provider]  traZODone (DESYREL) 50 MG tablet Take 50 mg by mouth 2 (two) times daily.  08/02/13   [provider]    Family History History reviewed. No pertinent family history.  Social History Social History  Substance Use Topics  . Smoking status: Never Smoker  . Smokeless tobacco: Never Used  . Alcohol use No     Allergies   Ativan [lorazepam]; Latex; Morphine and related; Penicillins; Pyridium [phenazopyridine hcl]; and Soy allergy   Review of Systems Review of Systems  Constitutional: Negative for chills, fatigue and fever.  HENT: Negative for congestion.   Respiratory: Negative for cough, chest tightness and shortness of breath.   Cardiovascular: Negative for chest pain.  Gastrointestinal: Negative for abdominal pain.  Genitourinary: Negative for flank pain.  Musculoskeletal: Negative for back pain, neck stiffness and stiffness.  Skin: Negative for rash and wound.  Neurological: Positive for headaches. Negative for light-headedness and numbness.   Psychiatric/Behavioral: Negative for agitation.  All other systems reviewed and are negative.    Physical Exam Updated Vital Signs There were no vitals taken for this visit.  Physical Exam  Constitutional: He is oriented to person, place, and time. He appears well-developed and well-nourished. No distress.  HENT:  Head: Normocephalic and atraumatic.  Mouth/Throat: Oropharynx is clear and moist. No oropharyngeal exudate.  Eyes: Pupils are equal, round, and reactive to light. Conjunctivae are normal.  Neck: Normal range of motion. Neck supple.  Cardiovascular: Normal rate and intact distal pulses.   No murmur heard. Pulmonary/Chest: Effort normal and breath sounds normal. No stridor. No respiratory distress. He has no wheezes. He exhibits no tenderness.  Abdominal: Soft. There is no tenderness.  Musculoskeletal: He exhibits tenderness. He exhibits no edema.  Neurological: He is alert and oriented to person, place, and time. No sensory deficit. He exhibits normal muscle tone.  Skin: Skin is warm and dry. Capillary refill takes less than 2 seconds. He is not diaphoretic. No erythema.  Nursing note and  vitals reviewed.    ED Treatments / Results  Labs (all labs ordered are listed, but only abnormal results are displayed) Labs Reviewed  CBC WITH DIFFERENTIAL/PLATELET - Abnormal; Notable for the following:       Result Value   WBC 12.2 (*)    RBC 4.19 (*)    Hemoglobin 11.8 (*)    HCT 35.9 (*)    Platelets 118 (*)    Neutro Abs 8.6 (*)    Monocytes Absolute 1.5 (*)    All other components within normal limits  COMPREHENSIVE METABOLIC PANEL - Abnormal; Notable for the following:    Creatinine, Ser 1.53 (*)    ALT 7 (*)    GFR calc non Af Amer 52 (*)    GFR calc Af Amer 60 (*)    All other components within normal limits  URINALYSIS, ROUTINE W REFLEX MICROSCOPIC - Abnormal; Notable for the following:    Color, Urine AMBER (*)    APPearance CLOUDY (*)    Hgb urine dipstick  SMALL (*)    Ketones, ur 5 (*)    Protein, ur 100 (*)    Nitrite POSITIVE (*)    Leukocytes, UA LARGE (*)    Bacteria, UA MANY (*)    All other components within normal limits  URINE CULTURE  PROTIME-INR  CORTISOL-AM, BLOOD  I-STAT CG4 LACTIC ACID, ED  I-STAT CG4 LACTIC ACID, ED    EKG  EKG Interpretation None       Radiology Dg Chest 2 View  Result Date: 10/02/2017 CLINICAL DATA:  Status post fall out of bed, with right shoulder pain and bruising. Initial encounter. EXAM: CHEST  2 VIEW COMPARISON:  Chest radiograph performed 01/30/2015 FINDINGS: There is stable elevation of the right hemidiaphragm. The lungs are well-aerated and clear. There is no evidence of focal opacification, pleural effusion or pneumothorax. The cardiomediastinal silhouette is within normal limits. No acute osseous abnormalities are seen. IMPRESSION: Stable elevation of the right hemidiaphragm. Lungs remain grossly clear. No displaced rib fractures identified. Electronically Signed   By: Roanna Raider M.D.   On: 10/02/2017 17:25   Dg Shoulder Right  Result Date: 10/02/2017 CLINICAL DATA:  51 year old male with history of trauma after falling out of bed yesterday evening complaining of pain in the right shoulder. EXAM: RIGHT SHOULDER - 2+ VIEW COMPARISON:  No priors. FINDINGS: Three views of the right shoulder demonstrate an acute displaced likely mildly impacted fracture of the surgical neck of the humerus, with approximately 8 mm of medial displacement of the distal fracture fragment. There may be some mild comminution of the fracture. The humeral head appears rotated in the glenoid fossa, but remains located. Other visualized bones appear intact. IMPRESSION: 1. Acute minimally displaced, likely impacted, potentially mildly comminuted fracture of the surgical neck of the right humerus, as above. Electronically Signed   By: Trudie Reed M.D.   On: 10/02/2017 17:29   Ct Head Wo Contrast  Result Date:  10/02/2017 CLINICAL DATA:  Patient fell out of bed last evening. Altered mentation and slow verbal communication. EXAM: CT HEAD WITHOUT CONTRAST CT CERVICAL SPINE WITHOUT CONTRAST TECHNIQUE: Multidetector CT imaging of the head and cervical spine was performed following the standard protocol without intravenous contrast. Multiplanar CT image reconstructions of the cervical spine were also generated. COMPARISON:  06/07/2017 FINDINGS: CT HEAD FINDINGS Brain: Mild superficial and central atrophy with chronic appearing small vessel ischemic disease. No acute intracranial hemorrhage, midline shift or edema. No large vascular territory infarct. No  intracranial hemorrhage, intra-axial mass nor extra-axial fluid collection. No midline shift or mass effect. No hydrocephalus. Vascular: No hyperdense vessel or unexpected calcification. Skull: Normal. Negative for fracture or focal lesion. Sinuses/Orbits: Minimal ethmoid and right maxillary sinus mucosal thickening. Clear mastoids. Intact orbits and globes. Other: None CT CERVICAL SPINE FINDINGS Alignment: Slight reversal cervical lordosis attributable to multilevel degenerative disc disease. Intact craniocervical relationship and atlantodental interval. Skull base and vertebrae: Skull base appears intact. Normal C1-C2 relationship. Intact dense. No acute fracture or suspicious osseous lesions. Soft tissues and spinal canal: No prevertebral fluid or swelling. No visible canal hematoma. Disc levels: Multilevel degenerative disc disease mild to moderate at C2-3 and C3-4 and moderate-to-marked from Celsius 4 through C7. No jumped or perched facets. Mild right side foraminal narrowing at C4-5, C5-6 and C6-7 due to uncovertebral joint and endplate degenerative spurring Upper chest: Negative. Other: None IMPRESSION: 1. No acute intracranial abnormality. 2. Stable atrophy with chronic minimal small vessel ischemic disease. 3. Chronic stable cervical spondylosis without acute cervical  spine fracture or posttraumatic subluxation. Electronically Signed   By: Tollie Eth M.D.   On: 10/02/2017 17:05   Ct Cervical Spine Wo Contrast  Result Date: 10/02/2017 CLINICAL DATA:  Patient fell out of bed last evening. Altered mentation and slow verbal communication. EXAM: CT HEAD WITHOUT CONTRAST CT CERVICAL SPINE WITHOUT CONTRAST TECHNIQUE: Multidetector CT imaging of the head and cervical spine was performed following the standard protocol without intravenous contrast. Multiplanar CT image reconstructions of the cervical spine were also generated. COMPARISON:  06/07/2017 FINDINGS: CT HEAD FINDINGS Brain: Mild superficial and central atrophy with chronic appearing small vessel ischemic disease. No acute intracranial hemorrhage, midline shift or edema. No large vascular territory infarct. No intracranial hemorrhage, intra-axial mass nor extra-axial fluid collection. No midline shift or mass effect. No hydrocephalus. Vascular: No hyperdense vessel or unexpected calcification. Skull: Normal. Negative for fracture or focal lesion. Sinuses/Orbits: Minimal ethmoid and right maxillary sinus mucosal thickening. Clear mastoids. Intact orbits and globes. Other: None CT CERVICAL SPINE FINDINGS Alignment: Slight reversal cervical lordosis attributable to multilevel degenerative disc disease. Intact craniocervical relationship and atlantodental interval. Skull base and vertebrae: Skull base appears intact. Normal C1-C2 relationship. Intact dense. No acute fracture or suspicious osseous lesions. Soft tissues and spinal canal: No prevertebral fluid or swelling. No visible canal hematoma. Disc levels: Multilevel degenerative disc disease mild to moderate at C2-3 and C3-4 and moderate-to-marked from Celsius 4 through C7. No jumped or perched facets. Mild right side foraminal narrowing at C4-5, C5-6 and C6-7 due to uncovertebral joint and endplate degenerative spurring Upper chest: Negative. Other: None IMPRESSION: 1. No  acute intracranial abnormality. 2. Stable atrophy with chronic minimal small vessel ischemic disease. 3. Chronic stable cervical spondylosis without acute cervical spine fracture or posttraumatic subluxation. Electronically Signed   By: Tollie Eth M.D.   On: 10/02/2017 17:05   Dg Humerus Right  Result Date: 10/02/2017 CLINICAL DATA:  51 year old male with history of trauma after falling out of bed yesterday evening. Pain in the right shoulder. EXAM: RIGHT HUMERUS - 2+ VIEW COMPARISON:  No priors. FINDINGS: Three views of the right shoulder demonstrate an acute displaced likely mildly impacted fracture of the surgical neck of the humerus, with approximately 8 mm of medial displacement of the distal fracture fragment. There may be some mild comminution of the fracture. The humeral head appears rotated in the glenoid fossa, but remains located. Distal aspect of the humerus is otherwise intact. IMPRESSION: 1. Acute mildly displaced, likely  impacted, potentially mildly comminuted fracture of the surgical neck of the left humerus, as above. Electronically Signed   By: Trudie Reed M.D.   On: 10/02/2017 17:28    Procedures Procedures (including critical care time)  Medications Ordered in ED Medications  fentaNYL (SUBLIMAZE) injection 50 mcg (not administered)  sodium chloride 0.9 % bolus 1,000 mL (0 mLs Intravenous Stopped 10/02/17 2041)     Initial Impression / Assessment and Plan / ED Course  I have reviewed the triage vital signs and the nursing notes.  Pertinent labs & imaging results that were available during my care of the patient were reviewed by me and considered in my medical decision making (see chart for details).     [name] is a 51 y.o. male with a past medical history significant for TBI, seizures, anoxic brain injury, quadriplegia, atrial fibrillation, hypothyroidism, cardiomyopathy, Addison's disease, PEG tube, and hyperlipidemia who presents with right arm pain after a  fall. EMS reports that patient had a fall out of bed at some point overnight. It was not witnessed. Patient reports pain in his right upper arm and his head. Patient denies losing consciousness. He does report headache. He denies chest pain or shortness of breath. He denies back pain. He is unsure about neck pain. He describes his pain as an 8 out of 10 severity in his upper arm. According to EMS, patient had imaging at his rehabilitation facility, [facility], where he was found to have either a fracture or dislocation of the right humerus. Patient denies preceding symptoms.   On exam, patient has tenderness in the right upper arm. No pain in the elbow. Patient has symmetric radial pulses. Patient has symmetric but weak grip strength bilaterally. Patient has pain with right arm manipulation. Patient's lungs clear bilaterally. No visible evidence of trauma to the head. No tenderness of the head. Abdomen nontender. PEG in place. Legs nontender. Patient could wiggle toes and had pulses present.  Patient will have imaging of the right upper arm and chest. Patient also imaging of the head and neck due to the fall with headache.  Anticipate reassessment after imaging. Blood pressure was initially 90 during transport. Patient is on cardiac monitor. If blood pressure improves, he will have pain medication administered.   Due to the tachycardia and soft blood pressures, patient will also have workup for occult infection contributing. According to history, patient has a history of pneumonias and urinary tract infections.  Patient's diagnostic laboratory testing began to return. Lactic acid nonelevated. Patient has a leukocytosis of 12.2 and mild anemia of 11.8. Metabolic panel showed acute kidney injury with a creatinine of 1.53 elevated from prior f 0.91 several months ago. Electrolytes reassuring. Chest x-ray showed no evidence of pneumonia. Shoulder x-ray revealed humeral neck fracture. Patient will be placed  into a splint and follow-up with orthopedics for this.  CT of the head revealed no acute intracranial abnormality. No evidence of cervical spine fracture.  Patient is awaiting urinalysis. Given the soft pressures and leukocytosis, still awaiting to see if patient has a UTI. Patient's pressures improved with fluids. Patient was finally able to get pain medication after the pressure improved. Fentanyl ordered.  Urinalysis revealed nitrites and leukocytes. Patient has urinary tract infection. Patient will be given antibiotics for the urinary tract infection. Patient will need rehydration for his acute kidney injury and monitoring for blood pressure with the soft blood pressures on arrival. Patient will be admitted to hospitalist service.    Final Clinical Impressions(s) /  ED Diagnoses   Final diagnoses:  Acute cystitis without hematuria  Fx humeral neck, right, closed, initial encounter  Fall, initial encounter  AKI (acute kidney injury) (HCC)     Clinical Impression: 1. Acute cystitis without hematuria   2. Fx humeral neck, right, closed, initial encounter   3. Fall, initial encounter   4. AKI (acute kidney injury) [facility])     Disposition: Admit to Hospitalist service    Tegeler, Canary Brim, MD 10/03/17 [phone]

## 2017-10-03 ENCOUNTER — Observation Stay (HOSPITAL_COMMUNITY): Payer: Medicare Other

## 2017-10-03 DIAGNOSIS — F419 Anxiety disorder, unspecified: Secondary | ICD-10-CM | POA: Diagnosis not present

## 2017-10-03 DIAGNOSIS — G931 Anoxic brain damage, not elsewhere classified: Secondary | ICD-10-CM | POA: Diagnosis not present

## 2017-10-03 DIAGNOSIS — K5909 Other constipation: Secondary | ICD-10-CM | POA: Diagnosis not present

## 2017-10-03 DIAGNOSIS — N3000 Acute cystitis without hematuria: Secondary | ICD-10-CM | POA: Diagnosis present

## 2017-10-03 DIAGNOSIS — R627 Adult failure to thrive: Secondary | ICD-10-CM | POA: Diagnosis not present

## 2017-10-03 DIAGNOSIS — Z905 Acquired absence of kidney: Secondary | ICD-10-CM | POA: Diagnosis not present

## 2017-10-03 DIAGNOSIS — A419 Sepsis, unspecified organism: Secondary | ICD-10-CM | POA: Diagnosis present

## 2017-10-03 DIAGNOSIS — Z23 Encounter for immunization: Secondary | ICD-10-CM | POA: Diagnosis present

## 2017-10-03 DIAGNOSIS — D696 Thrombocytopenia, unspecified: Secondary | ICD-10-CM | POA: Diagnosis present

## 2017-10-03 DIAGNOSIS — M25511 Pain in right shoulder: Secondary | ICD-10-CM | POA: Diagnosis present

## 2017-10-03 DIAGNOSIS — R1310 Dysphagia, unspecified: Secondary | ICD-10-CM | POA: Diagnosis not present

## 2017-10-03 DIAGNOSIS — E271 Primary adrenocortical insufficiency: Secondary | ICD-10-CM | POA: Diagnosis present

## 2017-10-03 DIAGNOSIS — E039 Hypothyroidism, unspecified: Secondary | ICD-10-CM | POA: Diagnosis not present

## 2017-10-03 DIAGNOSIS — N390 Urinary tract infection, site not specified: Secondary | ICD-10-CM | POA: Diagnosis not present

## 2017-10-03 DIAGNOSIS — G8250 Quadriplegia, unspecified: Secondary | ICD-10-CM | POA: Diagnosis present

## 2017-10-03 DIAGNOSIS — G40909 Epilepsy, unspecified, not intractable, without status epilepticus: Secondary | ICD-10-CM | POA: Diagnosis not present

## 2017-10-03 DIAGNOSIS — D62 Acute posthemorrhagic anemia: Secondary | ICD-10-CM | POA: Diagnosis present

## 2017-10-03 DIAGNOSIS — D638 Anemia in other chronic diseases classified elsewhere: Secondary | ICD-10-CM | POA: Diagnosis not present

## 2017-10-03 DIAGNOSIS — Y92122 Bedroom in nursing home as the place of occurrence of the external cause: Secondary | ICD-10-CM | POA: Diagnosis not present

## 2017-10-03 DIAGNOSIS — S42291A Other displaced fracture of upper end of right humerus, initial encounter for closed fracture: Secondary | ICD-10-CM | POA: Diagnosis not present

## 2017-10-03 DIAGNOSIS — L02211 Cutaneous abscess of abdominal wall: Secondary | ICD-10-CM | POA: Diagnosis present

## 2017-10-03 DIAGNOSIS — N179 Acute kidney failure, unspecified: Secondary | ICD-10-CM | POA: Diagnosis present

## 2017-10-03 DIAGNOSIS — E785 Hyperlipidemia, unspecified: Secondary | ICD-10-CM | POA: Diagnosis not present

## 2017-10-03 DIAGNOSIS — W06XXXA Fall from bed, initial encounter: Secondary | ICD-10-CM | POA: Diagnosis not present

## 2017-10-03 DIAGNOSIS — H409 Unspecified glaucoma: Secondary | ICD-10-CM | POA: Diagnosis not present

## 2017-10-03 DIAGNOSIS — E272 Addisonian crisis: Secondary | ICD-10-CM | POA: Diagnosis present

## 2017-10-03 DIAGNOSIS — I4891 Unspecified atrial fibrillation: Secondary | ICD-10-CM | POA: Diagnosis not present

## 2017-10-03 DIAGNOSIS — Z8679 Personal history of other diseases of the circulatory system: Secondary | ICD-10-CM | POA: Diagnosis not present

## 2017-10-03 DIAGNOSIS — B9620 Unspecified Escherichia coli [E. coli] as the cause of diseases classified elsewhere: Secondary | ICD-10-CM | POA: Diagnosis present

## 2017-10-03 DIAGNOSIS — S42211A Unspecified displaced fracture of surgical neck of right humerus, initial encounter for closed fracture: Secondary | ICD-10-CM | POA: Diagnosis present

## 2017-10-03 DIAGNOSIS — K219 Gastro-esophageal reflux disease without esophagitis: Secondary | ICD-10-CM | POA: Diagnosis not present

## 2017-10-03 LAB — BASIC METABOLIC PANEL
Anion gap: 11 (ref 5–15)
BUN: 18 mg/dL (ref 6–20)
CALCIUM: 8.6 mg/dL — AB (ref 8.9–10.3)
CHLORIDE: 106 mmol/L (ref 101–111)
CO2: 24 mmol/L (ref 22–32)
Creatinine, Ser: 1.33 mg/dL — ABNORMAL HIGH (ref 0.61–1.24)
GFR calc non Af Amer: >60 mL/min (ref >60)
Glucose, Bld: 85 mg/dL (ref 65–99)
Potassium: 4.3 mmol/L (ref 3.5–5.1)
SODIUM: 141 mmol/L (ref 135–145)

## 2017-10-03 LAB — CORTISOL-AM, BLOOD: CORTISOL - AM: 11.9 ug/dL (ref 6.7–22.6)

## 2017-10-03 LAB — MRSA PCR SCREENING: MRSA BY PCR: NEGATIVE

## 2017-10-03 LAB — CBC
HEMATOCRIT: 31.5 % — AB (ref 39.0–52.0)
HEMOGLOBIN: 10.3 g/dL — AB (ref 13.0–17.0)
MCH: 28.1 pg (ref 26.0–34.0)
MCHC: 32.7 g/dL (ref 30.0–36.0)
MCV: 86.1 fL (ref 78.0–100.0)
Platelets: 122 10*3/uL — ABNORMAL LOW (ref 150–400)
RBC: 3.66 MIL/uL — ABNORMAL LOW (ref 4.22–5.81)
RDW: 15.2 % (ref 11.5–15.5)
WBC: 8.2 10*3/uL (ref 4.0–10.5)

## 2017-10-03 LAB — GLUCOSE, CAPILLARY
GLUCOSE-CAPILLARY: 156 mg/dL — AB (ref 65–99)
GLUCOSE-CAPILLARY: 108 mg/dL — AB (ref 65–99)
Glucose-Capillary: 109 mg/dL — ABNORMAL HIGH (ref 65–99)

## 2017-10-03 LAB — PHOSPHORUS: Phosphorus: 3.2 mg/dL (ref 2.5–4.6)

## 2017-10-03 LAB — MAGNESIUM
MAGNESIUM: 1.6 mg/dL — AB (ref 1.7–2.4)
Magnesium: 1.6 mg/dL — ABNORMAL LOW (ref 1.7–2.4)

## 2017-10-03 LAB — VALPROIC ACID LEVEL: VALPROIC ACID LVL: 28 ug/mL — AB (ref 50.0–100.0)

## 2017-10-03 MED ORDER — RISPERIDONE 1 MG PO TBDP
2.0000 mg | ORAL_TABLET | Freq: Two times a day (BID) | ORAL | Status: DC
  Administered 2017-10-03 – 2017-10-07 (×10): 2 mg via SUBLINGUAL
  Filled 2017-10-03 (×11): qty 2

## 2017-10-03 MED ORDER — ENOXAPARIN SODIUM 40 MG/0.4ML ~~LOC~~ SOLN
40.0000 mg | SUBCUTANEOUS | Status: DC
  Administered 2017-10-03 – 2017-10-04 (×2): 40 mg via SUBCUTANEOUS
  Filled 2017-10-03 (×2): qty 0.4

## 2017-10-03 MED ORDER — SODIUM CHLORIDE 0.9 % IV BOLUS (SEPSIS)
1000.0000 mL | Freq: Once | INTRAVENOUS | Status: AC
  Administered 2017-10-03: 1000 mL via INTRAVENOUS

## 2017-10-03 MED ORDER — VITAL 1.5 CAL PO LIQD
237.0000 mL | Freq: Four times a day (QID) | ORAL | Status: DC
  Filled 2017-10-03: qty 237

## 2017-10-03 MED ORDER — PAROXETINE HCL 20 MG PO TABS
20.0000 mg | ORAL_TABLET | Freq: Every day | ORAL | Status: DC
  Administered 2017-10-03 – 2017-10-05 (×3): 20 mg via ORAL
  Filled 2017-10-03 (×3): qty 1

## 2017-10-03 MED ORDER — POLYETHYLENE GLYCOL 3350 17 G PO PACK
17.0000 g | PACK | Freq: Two times a day (BID) | ORAL | Status: DC
  Administered 2017-10-03 – 2017-10-07 (×10): 17 g
  Filled 2017-10-03 (×8): qty 1

## 2017-10-03 MED ORDER — POLYVINYL ALCOHOL 1.4 % OP SOLN
1.0000 [drp] | Freq: Four times a day (QID) | OPHTHALMIC | Status: DC
  Administered 2017-10-03 – 2017-10-07 (×14): 1 [drp] via OPHTHALMIC
  Filled 2017-10-03 (×2): qty 15

## 2017-10-03 MED ORDER — SODIUM CHLORIDE 0.9 % IV SOLN
INTRAVENOUS | Status: DC
  Administered 2017-10-04 – 2017-10-07 (×3): via INTRAVENOUS

## 2017-10-03 MED ORDER — PANTOPRAZOLE SODIUM 40 MG PO PACK
40.0000 mg | PACK | Freq: Every day | ORAL | Status: DC
  Administered 2017-10-03 – 2017-10-04 (×2): 40 mg
  Filled 2017-10-03 (×3): qty 20

## 2017-10-03 MED ORDER — ACETAMINOPHEN 325 MG PO TABS
650.0000 mg | ORAL_TABLET | ORAL | Status: DC | PRN
  Administered 2017-10-03: 650 mg
  Filled 2017-10-03: qty 2

## 2017-10-03 MED ORDER — ADULT MULTIVITAMIN LIQUID CH
15.0000 mL | Freq: Every day | ORAL | Status: DC
  Administered 2017-10-04 – 2017-10-07 (×3): 15 mL
  Filled 2017-10-03 (×5): qty 15

## 2017-10-03 MED ORDER — DILTIAZEM HCL 25 MG/5ML IV SOLN
10.0000 mg | Freq: Once | INTRAVENOUS | Status: AC
  Administered 2017-10-03: 10 mg via INTRAVENOUS
  Filled 2017-10-03: qty 5

## 2017-10-03 MED ORDER — FREE WATER
240.0000 mL | Freq: Four times a day (QID) | Status: DC
  Administered 2017-10-03 – 2017-10-07 (×18): 240 mL

## 2017-10-03 MED ORDER — MEROPENEM 1 G IV SOLR
1.0000 g | Freq: Three times a day (TID) | INTRAVENOUS | Status: DC
  Administered 2017-10-03 – 2017-10-06 (×10): 1 g via INTRAVENOUS
  Filled 2017-10-03 (×13): qty 1

## 2017-10-03 MED ORDER — SODIUM CHLORIDE 0.9 % IV BOLUS (SEPSIS)
500.0000 mL | Freq: Once | INTRAVENOUS | Status: AC
Start: 2017-10-03 — End: 2017-10-03
  Administered 2017-10-03: 500 mL via INTRAVENOUS

## 2017-10-03 MED ORDER — FENTANYL CITRATE (PF) 100 MCG/2ML IJ SOLN
25.0000 ug | INTRAMUSCULAR | Status: DC | PRN
  Administered 2017-10-03 – 2017-10-06 (×6): 25 ug via INTRAVENOUS
  Filled 2017-10-03 (×7): qty 2

## 2017-10-03 MED ORDER — TRAZODONE HCL 50 MG PO TABS
50.0000 mg | ORAL_TABLET | Freq: Two times a day (BID) | ORAL | Status: DC
  Administered 2017-10-03 – 2017-10-05 (×6): 50 mg via ORAL
  Filled 2017-10-03 (×6): qty 1

## 2017-10-03 MED ORDER — SENNOSIDES 8.8 MG/5ML PO SYRP
15.0000 mL | ORAL_SOLUTION | Freq: Two times a day (BID) | ORAL | Status: DC
  Administered 2017-10-03 – 2017-10-05 (×6): 15 mL via ORAL
  Filled 2017-10-03 (×6): qty 15

## 2017-10-03 MED ORDER — DEXTROSE 5 % IV SOLN
5.0000 mg/h | INTRAVENOUS | Status: DC
  Administered 2017-10-03: 10 mg/h via INTRAVENOUS
  Administered 2017-10-03: 5 mg/h via INTRAVENOUS
  Filled 2017-10-03 (×3): qty 100

## 2017-10-03 MED ORDER — ONDANSETRON HCL 4 MG PO TABS: 4.0000 mg | ORAL_TABLET | Freq: Four times a day (QID) | ORAL | Status: DC | PRN

## 2017-10-03 MED ORDER — DIVALPROEX SODIUM 125 MG PO CSDR
500.0000 mg | DELAYED_RELEASE_CAPSULE | Freq: Three times a day (TID) | ORAL | Status: DC
  Administered 2017-10-03 – 2017-10-07 (×13): 500 mg via ORAL
  Filled 2017-10-03 (×15): qty 4

## 2017-10-03 MED ORDER — ONDANSETRON HCL 4 MG/2ML IJ SOLN: 4.0000 mg | Freq: Four times a day (QID) | INTRAMUSCULAR | Status: DC | PRN

## 2017-10-03 MED ORDER — VITAL AF 1.2 CAL PO LIQD
237.0000 mL | Freq: Four times a day (QID) | ORAL | Status: DC
  Administered 2017-10-03 – 2017-10-06 (×7): 237 mL
  Filled 2017-10-03 (×14): qty 237

## 2017-10-03 MED ORDER — LEVOTHYROXINE SODIUM 100 MCG PO TABS
200.0000 ug | ORAL_TABLET | Freq: Every day | ORAL | Status: DC
  Administered 2017-10-03 – 2017-10-05 (×3): 200 ug via ORAL
  Filled 2017-10-03 (×2): qty 2
  Filled 2017-10-03: qty 1
  Filled 2017-10-03: qty 2

## 2017-10-03 MED ORDER — INFLUENZA VAC SPLIT QUAD 0.5 ML IM SUSY
0.5000 mL | PREFILLED_SYRINGE | INTRAMUSCULAR | Status: AC
  Administered 2017-10-05: 0.5 mL via INTRAMUSCULAR
  Filled 2017-10-03: qty 0.5

## 2017-10-03 NOTE — H&P (Signed)
Patient ID: [name] MRN: [id number] DOB/AGE: November 24, 1966 51 y.o.  Admit date: 10/02/2017  Admission Diagnoses:  Principal Problem:   Acute lower UTI Active Problems:   Anoxic brain injury (HCC)   History of atrial fibrillation   FTT (failure to thrive) in adult   Addisons disease (HCC)   Fracture of humerus anatomical neck, right, closed, initial encounter   AKI (acute kidney injury) (HCC)   UTI (urinary tract infection)   HPI: 51 year old male pt with a past med hx of Tramatic Brain injury and Addisonswho resides at a SNF.  The pt had a fall yesterday.  He reports right shoulder pain and was placed in a sling and admitted. Pt was also hypotensive in the ED and had a UTI.  Pt reports continued pain of his right shoulder today.   Past Medical History: Past Medical History:  Diagnosis Date  . A-fib (HCC)   . Addison disease (HCC)   . Addison disease (HCC)   . Anoxic brain injury (HCC)   . Anxiety   . Aspiration pneumonia (HCC)   . Atrial fibrillation (HCC)   . Chronic constipation 11/23/2011  . Dysphagia   . Dysphagia   . Encephalopathy   . Glaucoma   . Glucocorticoid deficiency (HCC)   . History of recurrent UTIs   . Hyperlipemia   . Hypothyroidism   . Mental disorder   . Myocardial infarction (HCC)   . Pneumoperitoneum of unknown etiology 12/21/2011  . Quadriplegia (HCC)   . Reflux   . Seizure disorder [facility])     Surgical History: Past Surgical History:  Procedure Laterality Date  . IR GENERIC HISTORICAL  10/20/2016   IR REPLC GASTRO/COLONIC TUBE PERCUT W/FLUORO 10/20/2016 Oley Balm, MD [facility]-INTERV RAD  . IR REPLC GASTRO/COLONIC TUBE PERCUT W/FLUORO  04/21/2017  . IR REPLC GASTRO/COLONIC TUBE PERCUT W/FLUORO  07/22/2017  . NEPHRECTOMY  unknown  . PEG TUBE PLACEMENT      Family History: History reviewed. No pertinent family history.  Social History: Social History   Social History  . Marital status: Divorced    Spouse name: N/A  . Number  of children: N/A  . Years of education: N/A   Occupational History  . Not on file.   Social History Main Topics  . Smoking status: Never Smoker  . Smokeless tobacco: Never Used  . Alcohol use No  . Drug use: No  . Sexual activity: No   Other Topics Concern  . Not on file   Social History Narrative  . No narrative on file    Allergies: Ativan [lorazepam]; Latex; Morphine and related; Penicillins; Pyridium [phenazopyridine hcl]; and Soy allergy  Medications: I have reviewed the patient's current medications.  Vital Signs: Patient Vitals for the past 24 hrs:  BP Temp Temp src Pulse Resp SpO2 Height Weight  10/03/17 0639 (!) 155/89 98.2 F (36.8 C) Oral 85 16 98 % - -  10/03/17 0314 (!) 146/93 98.3 F (36.8 C) Oral 88 17 96 %  (1.803 m) 108 kg (238 lb 1.6 oz)  10/03/17 0057 (!) 107/56 - - 76 16 100 % - -  10/03/17 0000 (!) 107/56 - - (!) 125 - 100 % - -  10/02/17 2206 (!) 123/111 - - (!) 103 14 100 % - -  10/02/17 2200 (!) 123/111 - - (!) 132 - 100 % - -  10/02/17 1905 (!) 86/66 98.8 F (37.1 C) Oral (!) 130 19 100 % - -  10/02/17 1607 90/68 98.6 F (37 C) Oral (!) 106 18 98 % - -    Radiology: Dg Chest 2 View  Result Date: 10/02/2017 CLINICAL DATA:  Status post fall out of bed, with right shoulder pain and bruising. Initial encounter. EXAM: CHEST  2 VIEW COMPARISON:  Chest radiograph performed 01/30/2015 FINDINGS: There is stable elevation of the right hemidiaphragm. The lungs are well-aerated and clear. There is no evidence of focal opacification, pleural effusion or pneumothorax. The cardiomediastinal silhouette is within normal limits. No acute osseous abnormalities are seen. IMPRESSION: Stable elevation of the right hemidiaphragm. Lungs remain grossly clear. No displaced rib fractures identified. Electronically Signed   By: Roanna Raider M.D.   On: 10/02/2017 17:25   Dg Shoulder Right  Result Date: 10/02/2017 CLINICAL DATA:  51 year old male with history of  trauma after falling out of bed yesterday evening complaining of pain in the right shoulder. EXAM: RIGHT SHOULDER - 2+ VIEW COMPARISON:  No priors. FINDINGS: Three views of the right shoulder demonstrate an acute displaced likely mildly impacted fracture of the surgical neck of the humerus, with approximately 8 mm of medial displacement of the distal fracture fragment. There may be some mild comminution of the fracture. The humeral head appears rotated in the glenoid fossa, but remains located. Other visualized bones appear intact. IMPRESSION: 1. Acute minimally displaced, likely impacted, potentially mildly comminuted fracture of the surgical neck of the right humerus, as above. Electronically Signed   By: Trudie Reed M.D.   On: 10/02/2017 17:29   Ct Head Wo Contrast  Result Date: 10/02/2017 CLINICAL DATA:  Patient fell out of bed last evening. Altered mentation and slow verbal communication. EXAM: CT HEAD WITHOUT CONTRAST CT CERVICAL SPINE WITHOUT CONTRAST TECHNIQUE: Multidetector CT imaging of the head and cervical spine was performed following the standard protocol without intravenous contrast. Multiplanar CT image reconstructions of the cervical spine were also generated. COMPARISON:  06/07/2017 FINDINGS: CT HEAD FINDINGS Brain: Mild superficial and central atrophy with chronic appearing small vessel ischemic disease. No acute intracranial hemorrhage, midline shift or edema. No large vascular territory infarct. No intracranial hemorrhage, intra-axial mass nor extra-axial fluid collection. No midline shift or mass effect. No hydrocephalus. Vascular: No hyperdense vessel or unexpected calcification. Skull: Normal. Negative for fracture or focal lesion. Sinuses/Orbits: Minimal ethmoid and right maxillary sinus mucosal thickening. Clear mastoids. Intact orbits and globes. Other: None CT CERVICAL SPINE FINDINGS Alignment: Slight reversal cervical lordosis attributable to multilevel degenerative disc  disease. Intact craniocervical relationship and atlantodental interval. Skull base and vertebrae: Skull base appears intact. Normal C1-C2 relationship. Intact dense. No acute fracture or suspicious osseous lesions. Soft tissues and spinal canal: No prevertebral fluid or swelling. No visible canal hematoma. Disc levels: Multilevel degenerative disc disease mild to moderate at C2-3 and C3-4 and moderate-to-marked from Celsius 4 through C7. No jumped or perched facets. Mild right side foraminal narrowing at C4-5, C5-6 and C6-7 due to uncovertebral joint and endplate degenerative spurring Upper chest: Negative. Other: None IMPRESSION: 1. No acute intracranial abnormality. 2. Stable atrophy with chronic minimal small vessel ischemic disease. 3. Chronic stable cervical spondylosis without acute cervical spine fracture or posttraumatic subluxation. Electronically Signed   By: Tollie Eth M.D.   On: 10/02/2017 17:05   Ct Cervical Spine Wo Contrast  Result Date: 10/02/2017 CLINICAL DATA:  Patient fell out of bed last evening. Altered mentation and slow verbal communication. EXAM: CT HEAD WITHOUT CONTRAST CT CERVICAL SPINE WITHOUT CONTRAST TECHNIQUE: Multidetector CT imaging of the  head and cervical spine was performed following the standard protocol without intravenous contrast. Multiplanar CT image reconstructions of the cervical spine were also generated. COMPARISON:  06/07/2017 FINDINGS: CT HEAD FINDINGS Brain: Mild superficial and central atrophy with chronic appearing small vessel ischemic disease. No acute intracranial hemorrhage, midline shift or edema. No large vascular territory infarct. No intracranial hemorrhage, intra-axial mass nor extra-axial fluid collection. No midline shift or mass effect. No hydrocephalus. Vascular: No hyperdense vessel or unexpected calcification. Skull: Normal. Negative for fracture or focal lesion. Sinuses/Orbits: Minimal ethmoid and right maxillary sinus mucosal thickening. Clear  mastoids. Intact orbits and globes. Other: None CT CERVICAL SPINE FINDINGS Alignment: Slight reversal cervical lordosis attributable to multilevel degenerative disc disease. Intact craniocervical relationship and atlantodental interval. Skull base and vertebrae: Skull base appears intact. Normal C1-C2 relationship. Intact dense. No acute fracture or suspicious osseous lesions. Soft tissues and spinal canal: No prevertebral fluid or swelling. No visible canal hematoma. Disc levels: Multilevel degenerative disc disease mild to moderate at C2-3 and C3-4 and moderate-to-marked from Celsius 4 through C7. No jumped or perched facets. Mild right side foraminal narrowing at C4-5, C5-6 and C6-7 due to uncovertebral joint and endplate degenerative spurring Upper chest: Negative. Other: None IMPRESSION: 1. No acute intracranial abnormality. 2. Stable atrophy with chronic minimal small vessel ischemic disease. 3. Chronic stable cervical spondylosis without acute cervical spine fracture or posttraumatic subluxation. Electronically Signed   By: Tollie Eth M.D.   On: 10/02/2017 17:05   Dg Humerus Right  Result Date: 10/02/2017 CLINICAL DATA:  51 year old male with history of trauma after falling out of bed yesterday evening. Pain in the right shoulder. EXAM: RIGHT HUMERUS - 2+ VIEW COMPARISON:  No priors. FINDINGS: Three views of the right shoulder demonstrate an acute displaced likely mildly impacted fracture of the surgical neck of the humerus, with approximately 8 mm of medial displacement of the distal fracture fragment. There may be some mild comminution of the fracture. The humeral head appears rotated in the glenoid fossa, but remains located. Distal aspect of the humerus is otherwise intact. IMPRESSION: 1. Acute mildly displaced, likely impacted, potentially mildly comminuted fracture of the surgical neck of the left humerus, as above. Electronically Signed   By: Trudie Reed M.D.   On: 10/02/2017 17:28     Labs:  Recent Labs  10/02/17 1622 10/03/17 0627  WBC 12.2* 8.2  RBC 4.19* 3.66*  HCT 35.9* 31.5*  PLT 118* 122*    Recent Labs  10/02/17 1622 10/03/17 0627  NA 139 141  K 4.6 4.3  CL 103 106  CO2 25 24  BUN 17 18  CREATININE 1.53* 1.33*  GLUCOSE 94 85  CALCIUM 8.9 8.6*    Recent Labs  10/02/17 1622  INR 1.04    Review of Systems: ROS  Physical Exam: Pt has slow verbal communication RUE ecchymosis and tenderness noted Pt reports sensation to light touch Pt is able to squeeze my hand bilaterally    Assessment and Plan: Reviewed imaging - mildly displaced comminuted fracture of the surgical neck of the left humerus Consulted Dr. Shon Baton No surgical intervention considered currently Continue use of sling  Anette Riedel PAC for Steven Lick, MD [facility] [phone]  Agree with above Reviewed xrays with Dr Aundria Rud No indication for surgical intervention Recommend sling and f/u in 2 weeks with Dr Aundria Rud Will sign off -please call if any issues arise.

## 2017-10-03 NOTE — Progress Notes (Addendum)
Patient seen and examined  51 y.o. male with a past medical history significant for TBI, seizures, anoxic brain injury, quadriplegia, atrial fibrillation currently normal sinus rhythm, hypothyroidism, cardiomyopathy, Addison's disease, PEG tube, and hyperlipidemia who presents with right arm pain after a fall. EMS reports that patient had a fall out of bed at some point overnight. According to EMS, patient had imaging at his rehabilitation facility, [facility], where he was found to have either a fracture or dislocation of the right humerus.  Patient also found to be tachycardic and hypotensive hydrated with IV fluids Chest x-ray showed no evidence of pneumonia. Shoulder x-ray revealed humeral neck fracture. Patient will be placed into a splint and follow-up with orthopedics for this.  CT of the head revealed no acute intracranial abnormality. No evidence of cervical spine fracture.   Patient found to have a UTI, started on meropenem  Assessment and plan Urinary tract infection-history of ESBL, continue meropenem, follow results of urine culture  History of atrial fibrillation-went from normal sinus rhythm and sinus tachycardia to  A fib , likely secondary to the above, transfer patient to stepdown, patient started on cardizem gtt for rate control. Requested PCCM to place a central line   History of Addison's disease-not and chronic steroids, a.m. cortisol pending  History of traumatic brain injury, continue with seizure prophylaxis, psychiatric medications  History of seizure disorder-continue Depakote, check level  Fracture of the right humerus-Acute minimally displaced, likely impacted, potentially mildly comminuted fracture of the surgical neck of the right humerus, Dr. Shon Baton on call for [facility] has been consulted  Acute kidney injury-baseline creatinine around 0.9, presented with a creatinine of 1.53, improving

## 2017-10-03 NOTE — Progress Notes (Signed)
Unable to complete admission history at this time. Patient oriented to self only, does not answer admission questions appropriately.

## 2017-10-03 NOTE — Progress Notes (Signed)
Spoke with Alcario Drought RN re urgent midline vs CVC due to potential needs for multiple drips/access needed.   States pt is to be transferred to ICU.  RN to  notify MD of conversation and recommend CVC placement.

## 2017-10-03 NOTE — Progress Notes (Signed)
Pt attempting to get out of bed and has removed his condom catheter 2 times.  Pt easily directed.  Tele sitter ordered and placed in room for pt safety/fall prevention.  Erick Blinks, RN

## 2017-10-03 NOTE — Procedures (Signed)
Central Line Placement  A central venous catheter was placed for a cardiazem infusion and multiple antibiotics in a patient with no reliable peripheral access. He is unable to consent so the procedure was done as medically indicated. Time out performed then the skin over the left subclavian prepped with chlorhexidine and widely draped. The vein was easily cannulated and a wire gently passed. The tract was dilated then a 7 FR 3 lumen catheter placed to 19 cm. There was good flow form all ports.  CXR is pending

## 2017-10-03 NOTE — Progress Notes (Addendum)
Initial Nutrition Assessment  DOCUMENTATION CODES:   Obesity unspecified  INTERVENTION:   When medically able: Provide 237 ml (1 carton) of Vital 1.5 QID via PEG. This will provide  1420 kcal (68% of needs), 64g protein (67% of needs) and 724 ml H2O.  Continue free water flushes of 240 ml every 6 hours (960 ml total).  RD will continue to monitor and adjust TF as needed given PO intakes  **Addendum: Spoke with Pharmacy who states Vital 1.5 is not available in 237 ml cartons only 1L bottles. Will switch formula to Vital AF 1.2 for the time being and will continue order 237 ml QID at this time. Will adjust as needed at follow-up.  NUTRITION DIAGNOSIS:   Inadequate oral intake related to lethargy/confusion as evidenced by per patient/family report.  GOAL:   Patient will meet greater than or equal to 90% of their needs   MONITOR:   PO intake, Labs, Weight trends, TF tolerance, I & O's  REASON FOR ASSESSMENT:   Consult Enteral/tube feeding initiation and management  ASSESSMENT:   51 y.o. male with a past medical history significant for TBI, seizures, anoxic brain injury, quadriplegia, atrial fibrillation currently normal sinus rhythm, hypothyroidism, cardiomyopathy, Addison's disease, PEG tube, and hyperlipidemia who presents with right arm pain after a fall. EMS reports that patient had a fall out of bed at some point overnight. According to EMS, patient had imaging at his rehabilitation facility, [facility], where he was found to have either a fracture or dislocation of the right humerus.  Pt with slow communication and unable to provide much history regarding TF regimen (h/o TBI). Per H&P, pt consumes Vital 1.5 PO if consuming < 50% of meals. Pt from [facility]. Pt with PEG. Pt on a regular diet with honey thick liquids recommended. Pt had breakfast of eggs, sausage, bagel and honey thick OJ. Unsure of PO intake as pt is now being transferred to ICU.  Pt quadriplegic, admitted  after fall out of bed and now with fracture of left humerus.  Will place order for bolus feeds via PEG for Vital 1.5. At this time, TF will provide ~67-68% of estimated needs to allow for meal consumption. Will continue to monitor PO intakes to see if TF needs to provide 100% of needs.  Pt with 37 lb weight loss since 6/11 (13% wt loss x 4 months, significant for time frame). Nutrition focused physical exam shows no sign of depletion of muscle mass or body fat.  Medications: Liquid MVI daily, Miralax packet BID, Senokot syrup BID Labs reviewed: Low Mg  Diet Order:  Diet regular Room service appropriate? Yes; Fluid consistency: Honey Thick  Skin:  Wound (see comment) (Non-pressure wound on mid abdomen)  Last BM:  PTA  Height:   Ht Readings from Last 1 Encounters:  10/03/17  (1.803 m)    Weight:   Wt Readings from Last 1 Encounters:  10/03/17 238 lb 1.6 oz (108 kg)    Ideal Body Weight:  70.3 kg  BMI:  Body mass index is 33.21 kg/m.  Estimated Nutritional Needs:   Kcal:  2100-2300  Protein:  95-105g  Fluid:  2.1L/day  EDUCATION NEEDS:   No education needs identified at this time  Tilda Franco, MS, RD, LDN Pager: [phone] After Hours Pager: [phone]

## 2017-10-03 NOTE — H&P (Addendum)
History and Physical    [name] ZOX:[id number] DOB: 03-Mar-1966 DOA: 10/02/2017  PCP: Steven Housekeeper, MD  Patient coming from: [place]  I have personally briefly reviewed patient's old medical records in [facility]  Chief Complaint: [name] out of bed  HPI: [name] is a 51 y.o. male with medical history significant of TBI, ESBL UTI, A.Fib, chronic constipation.  At baseline patient resides in SNF, is oriented to self, walks un assisted and curses at everyone according to our prior notes.  Sent in today after falling out of bed.  Complaining of severe R shoulder pain.  Found to have Fx of humerous.  BPs noted to be on the low side (80s-90s) in ED, work up ensued, and patient found to have UTI.   ED Course: Merrem for UTI.  Got 1L bolus, improvement in BP to 110 systolic (looks like he usually runs 100-110 systolic based on prior discharge blood pressures from admits previously), still tachycardic to 120.  Getting 2nd L bolus.  No abd pain, no N/V.   Review of Systems: As per HPI otherwise 10 point review of systems negative.   Past Medical History:  Diagnosis Date  . A-fib (HCC)   . Addison disease (HCC)   . Addison disease (HCC)   . Anoxic brain injury (HCC)   . Anxiety   . Aspiration pneumonia (HCC)   . Atrial fibrillation (HCC)   . Chronic constipation 11/23/2011  . Dysphagia   . Dysphagia   . Encephalopathy   . Glaucoma   . Glucocorticoid deficiency (HCC)   . History of recurrent UTIs   . Hyperlipemia   . Hypothyroidism   . Mental disorder   . Myocardial infarction (HCC)   . Pneumoperitoneum of unknown etiology 12/21/2011  . Quadriplegia (HCC)   . Reflux   . Seizure disorder [facility])     Past Surgical History:  Procedure Laterality Date  . IR GENERIC HISTORICAL  10/20/2016   IR REPLC GASTRO/COLONIC TUBE PERCUT W/FLUORO 10/20/2016 Oley Balm, MD [facility]-INTERV RAD  . IR REPLC GASTRO/COLONIC TUBE PERCUT W/FLUORO  04/21/2017  . IR REPLC  GASTRO/COLONIC TUBE PERCUT W/FLUORO  07/22/2017  . NEPHRECTOMY  unknown  . PEG TUBE PLACEMENT       reports that he has never smoked. He has never used smokeless tobacco. He reports that he does not drink alcohol or use drugs.  Allergies  Allergen Reactions  . Ativan [Lorazepam] Other (See Comments)    Unknown (not listed on physician's orders from [facility] 07/05/15)  . Latex Dermatitis  . Morphine And Related Other (See Comments)    Listed on physician's orders from [facility] 07/05/15  . Penicillins Other (See Comments)    Listed on physician's orders from [facility] 07/05/15 Has patient had a PCN reaction causing immediate rash, facial/tongue/throat swelling, SOB or lightheadedness with hypotension: unknown Has patient had a PCN reaction causing severe rash involving mucus membranes or skin necrosis: unknown Has patient had a PCN reaction that required hospitalization: no Has patient had a PCN reaction occurring within the last 10 years: yes If all of the above answers are "NO", then may proceed with Cephalosporin [place]  . Pyridium [Phenazopyridine Hcl] Other (See Comments)    Listed on physician's orders from [facility] 07/05/15  . Soy Allergy Other (See Comments)    Listed on physician's orders from [facility] 07/05/15    History reviewed. No pertinent family history.   Prior to Admission medications   Medication Sig  Start Date End Date Taking? Authorizing Provider  acetaminophen (TYLENOL) 325 MG tablet Place 650 mg into feeding tube every 4 (four) hours as needed for mild pain.    [provider]  divalproex (DEPAKOTE SPRINKLE) 125 MG capsule Take 500 mg by mouth 3 (three) times daily.  07/18/13   [provider]  levothyroxine (SYNTHROID, LEVOTHROID) 175 MCG tablet Take 175 mcg by mouth daily before breakfast. 05/20/17   [provider]  loratadine (CLARITIN) 10 MG tablet 10 mg by PEG Tube route daily.     [provider]  METOPROLOL SUCCINATE ER  PO 12.5 mg by PEG Tube route 2 (two) times daily. Metoprolol tartrate 6.25 mg/5 ml suspension    [provider]  Multiple Vitamins-Minerals (CERTAVITE/ANTIOXIDANTS) LIQD 15 mLs by PEG Tube route daily at 12 noon.     [provider]  Nutritional Supplements (VITAL 1.5 CAL PO) Take 237 mLs by mouth as needed (For less than 50% food consumption.).    [provider]  pantoprazole sodium (PROTONIX) 40 mg/20 mL PACK 40 mg by PEG Tube route daily at 12 noon.  11/30/11   [name], MD  PARoxetine (PAXIL) 20 MG tablet Take 20 mg by mouth daily. 06/03/17   [provider]  polyethylene glycol (MIRALAX / GLYCOLAX) packet 17 g by PEG Tube route 2 (two) times daily. Mix 17gm  In 4-8 ounces of liquid    [provider]  polyvinyl alcohol (LIQUIFILM TEARS) 1.4 % ophthalmic solution Place 1 drop into both eyes 4 (four) times daily. 9am, 2pm, 6pm, 9pm    [provider]  risperiDONE (RISPERDAL M-TABS) 2 MG disintegrating tablet Place 2 mg under the tongue 2 (two) times daily. 05/08/17   [provider]  sennosides (SENOKOT) 8.8 MG/5ML syrup Take 15 mLs by mouth 2 (two) times daily.    [provider]  traZODone (DESYREL) 50 MG tablet Take 50 mg by mouth 2 (two) times daily.  08/02/13   [provider]    Physical Exam: Vitals:   10/02/17 2200 10/02/17 2206 10/03/17 0000 10/03/17 0057  BP: (!) 123/111 (!) 123/111 (!) 107/56 (!) 107/56  Pulse: (!) 132 (!) 103 (!) 125 76  Resp:  14  16  Temp:      TempSrc:      SpO2: 100% 100% 100% 100%    Constitutional: NAD, calm, comfortable Eyes: PERRL, lids and conjunctivae normal ENMT: Mucous membranes are moist. Posterior pharynx clear of any exudate or lesions.Normal dentition.  Neck: normal, supple, no masses, no thyromegaly Respiratory: clear to auscultation bilaterally, no wheezing, no crackles. Normal respiratory effort. No accessory muscle use.  Cardiovascular: Regular rate and  rhythm, no murmurs / rubs / gallops. No extremity edema. 2+ pedal pulses. No carotid bruits.  Abdomen: no tenderness, no masses palpated. No hepatosplenomegaly. Bowel sounds positive.  Musculoskeletal: RUE shoulder bruising and tenderness. Skin: no rashes, lesions, ulcers. No induration Neurologic: CN 2-12 grossly intact. Sensation intact, DTR normal. Strength 5/5 in all 4.  Psychiatric: Oriented to person.  Slow verbal communication.   Labs on Admission: I have personally reviewed following labs and imaging studies  CBC:  Recent Labs Lab 10/02/17 1622  WBC 12.2*  NEUTROABS 8.6*  HGB 11.8*  HCT 35.9*  MCV 85.7  PLT 118*   Basic Metabolic Panel:  Recent Labs Lab 10/02/17 1622  NA 139  K 4.6  CL 103  CO2 25  GLUCOSE 94  BUN 17  CREATININE 1.53*  CALCIUM  8.9   GFR: CrCl cannot be calculated (Unknown ideal weight.). Liver Function Tests:  Recent Labs Lab 10/02/17 1622  AST 20  ALT 7*  ALKPHOS 62  BILITOT 0.7  PROT 6.9  ALBUMIN 3.5   No results for input(s): LIPASE, AMYLASE in the last 168 hours. No results for input(s): AMMONIA in the last 168 hours. Coagulation Profile:  Recent Labs Lab 10/02/17 1622  INR 1.04   Cardiac Enzymes: No results for input(s): CKTOTAL, CKMB, CKMBINDEX, TROPONINI in the last 168 hours. BNP (last 3 results) No results for input(s): PROBNP in the last 8760 hours. HbA1C: No results for input(s): HGBA1C in the last 72 hours. CBG: No results for input(s): GLUCAP in the last 168 hours. Lipid Profile: No results for input(s): CHOL, HDL, LDLCALC, TRIG, CHOLHDL, LDLDIRECT in the last 72 hours. Thyroid Function Tests: No results for input(s): TSH, T4TOTAL, FREET4, T3FREE, THYROIDAB in the last 72 hours. Anemia Panel: No results for input(s): VITAMINB12, FOLATE, FERRITIN, TIBC, IRON, RETICCTPCT in the last 72 hours. Urine analysis:    Component Value Date/Time   COLORURINE AMBER (A) 10/02/2017 2325   APPEARANCEUR CLOUDY (A)  10/02/2017 2325   LABSPEC 1.020 10/02/2017 2325   PHURINE 5.0 10/02/2017 2325   GLUCOSEU NEGATIVE 10/02/2017 2325   HGBUR SMALL (A) 10/02/2017 2325   BILIRUBINUR NEGATIVE 10/02/2017 2325   KETONESUR 5 (A) 10/02/2017 2325   PROTEINUR 100 (A) 10/02/2017 2325   UROBILINOGEN 0.2 01/30/2015 1059   NITRITE POSITIVE (A) 10/02/2017 2325   LEUKOCYTESUR LARGE (A) 10/02/2017 2325    Radiological Exams on Admission: Dg Chest 2 View  Result Date: 10/02/2017 CLINICAL DATA:  Status post fall out of bed, with right shoulder pain and bruising. Initial encounter. EXAM: CHEST  2 VIEW COMPARISON:  Chest radiograph performed 01/30/2015 FINDINGS: There is stable elevation of the right hemidiaphragm. The lungs are well-aerated and clear. There is no evidence of focal opacification, pleural effusion or pneumothorax. The cardiomediastinal silhouette is within normal limits. No acute osseous abnormalities are seen. IMPRESSION: Stable elevation of the right hemidiaphragm. Lungs remain grossly clear. No displaced rib fractures identified. Electronically Signed   By: Roanna Raider M.D.   On: 10/02/2017 17:25   Dg Shoulder Right  Result Date: 10/02/2017 CLINICAL DATA:  51 year old male with history of trauma after falling out of bed yesterday evening complaining of pain in the right shoulder. EXAM: RIGHT SHOULDER - 2+ VIEW COMPARISON:  No priors. FINDINGS: Three views of the right shoulder demonstrate an acute displaced likely mildly impacted fracture of the surgical neck of the humerus, with approximately 8 mm of medial displacement of the distal fracture fragment. There may be some mild comminution of the fracture. The humeral head appears rotated in the glenoid fossa, but remains located. Other visualized bones appear intact. IMPRESSION: 1. Acute minimally displaced, likely impacted, potentially mildly comminuted fracture of the surgical neck of the right humerus, as above. Electronically Signed   By: Trudie Reed  M.D.   On: 10/02/2017 17:29   Ct Head Wo Contrast  Result Date: 10/02/2017 CLINICAL DATA:  Patient fell out of bed last evening. Altered mentation and slow verbal communication. EXAM: CT HEAD WITHOUT CONTRAST CT CERVICAL SPINE WITHOUT CONTRAST TECHNIQUE: Multidetector CT imaging of the head and cervical spine was performed following the standard protocol without intravenous contrast. Multiplanar CT image reconstructions of the cervical spine were also generated. COMPARISON:  06/07/2017 FINDINGS: CT HEAD FINDINGS Brain: Mild superficial and central atrophy with chronic appearing small vessel ischemic disease.  No acute intracranial hemorrhage, midline shift or edema. No large vascular territory infarct. No intracranial hemorrhage, intra-axial mass nor extra-axial fluid collection. No midline shift or mass effect. No hydrocephalus. Vascular: No hyperdense vessel or unexpected calcification. Skull: Normal. Negative for fracture or focal lesion. Sinuses/Orbits: Minimal ethmoid and right maxillary sinus mucosal thickening. Clear mastoids. Intact orbits and globes. Other: None CT CERVICAL SPINE FINDINGS Alignment: Slight reversal cervical lordosis attributable to multilevel degenerative disc disease. Intact craniocervical relationship and atlantodental interval. Skull base and vertebrae: Skull base appears intact. Normal C1-C2 relationship. Intact dense. No acute fracture or suspicious osseous lesions. Soft tissues and spinal canal: No prevertebral fluid or swelling. No visible canal hematoma. Disc levels: Multilevel degenerative disc disease mild to moderate at C2-3 and C3-4 and moderate-to-marked from Celsius 4 through C7. No jumped or perched facets. Mild right side foraminal narrowing at C4-5, C5-6 and C6-7 due to uncovertebral joint and endplate degenerative spurring Upper chest: Negative. Other: None IMPRESSION: 1. No acute intracranial abnormality. 2. Stable atrophy with chronic minimal small vessel ischemic  disease. 3. Chronic stable cervical spondylosis without acute cervical spine fracture or posttraumatic subluxation. Electronically Signed   By: Tollie Eth M.D.   On: 10/02/2017 17:05   Ct Cervical Spine Wo Contrast  Result Date: 10/02/2017 CLINICAL DATA:  Patient fell out of bed last evening. Altered mentation and slow verbal communication. EXAM: CT HEAD WITHOUT CONTRAST CT CERVICAL SPINE WITHOUT CONTRAST TECHNIQUE: Multidetector CT imaging of the head and cervical spine was performed following the standard protocol without intravenous contrast. Multiplanar CT image reconstructions of the cervical spine were also generated. COMPARISON:  06/07/2017 FINDINGS: CT HEAD FINDINGS Brain: Mild superficial and central atrophy with chronic appearing small vessel ischemic disease. No acute intracranial hemorrhage, midline shift or edema. No large vascular territory infarct. No intracranial hemorrhage, intra-axial mass nor extra-axial fluid collection. No midline shift or mass effect. No hydrocephalus. Vascular: No hyperdense vessel or unexpected calcification. Skull: Normal. Negative for fracture or focal lesion. Sinuses/Orbits: Minimal ethmoid and right maxillary sinus mucosal thickening. Clear mastoids. Intact orbits and globes. Other: None CT CERVICAL SPINE FINDINGS Alignment: Slight reversal cervical lordosis attributable to multilevel degenerative disc disease. Intact craniocervical relationship and atlantodental interval. Skull base and vertebrae: Skull base appears intact. Normal C1-C2 relationship. Intact dense. No acute fracture or suspicious osseous lesions. Soft tissues and spinal canal: No prevertebral fluid or swelling. No visible canal hematoma. Disc levels: Multilevel degenerative disc disease mild to moderate at C2-3 and C3-4 and moderate-to-marked from Celsius 4 through C7. No jumped or perched facets. Mild right side foraminal narrowing at C4-5, C5-6 and C6-7 due to uncovertebral joint and endplate  degenerative spurring Upper chest: Negative. Other: None IMPRESSION: 1. No acute intracranial abnormality. 2. Stable atrophy with chronic minimal small vessel ischemic disease. 3. Chronic stable cervical spondylosis without acute cervical spine fracture or posttraumatic subluxation. Electronically Signed   By: Tollie Eth M.D.   On: 10/02/2017 17:05   Dg Humerus Right  Result Date: 10/02/2017 CLINICAL DATA:  51 year old male with history of trauma after falling out of bed yesterday evening. Pain in the right shoulder. EXAM: RIGHT HUMERUS - 2+ VIEW COMPARISON:  No priors. FINDINGS: Three views of the right shoulder demonstrate an acute displaced likely mildly impacted fracture of the surgical neck of the humerus, with approximately 8 mm of medial displacement of the distal fracture fragment. There may be some mild comminution of the fracture. The humeral head appears rotated in the glenoid fossa, but remains located.  Distal aspect of the humerus is otherwise intact. IMPRESSION: 1. Acute mildly displaced, likely impacted, potentially mildly comminuted fracture of the surgical neck of the left humerus, as above. Electronically Signed   By: Trudie Reed M.D.   On: 10/02/2017 17:28    EKG: Independently reviewed.  Assessment/Plan Principal Problem:   Acute lower UTI Active Problems:   Anoxic brain injury (HCC)   History of atrial fibrillation   FTT (failure to thrive) in adult   Addisons disease (HCC)   Fracture of humerus anatomical neck, right, closed, initial encounter   AKI (acute kidney injury) (HCC)    1. Acute lower UTI - 1. Empiric merrem given h/o ESBL 2. Culture pending 3. IVF: 2L in ED and NS at 125 cc/hr thereafter 4. Tele monitor for tachycardia, checking EKG to see if he is in A.Fib or not 2. H/o Addison's - 1. Not on chronic steroids 2. Checking AM cortisol 3. Hypotension resolved with IVF, if HR also resolves with IVF and isnt due to A.Fib, will hold off for now on  starting stress dose steroids since I dont think hes in adrenal crisis at the moment. 3. H/o TBI / anoxic brain injury - 1. Continue home psych meds 4. Humerous fx - 1. In sling 5. AKI - 1. IVF 2. Repeat BMP in AM  DVT prophylaxis: Lovenox Code Status: Full Family Communication: No family in room Disposition Plan: SNF after admit Consults called: None Admission status: Place in obs   GARDNER, Heywood Iles. DO [facility] Pager [phone]  If 7AM-7PM, please contact day team taking care of patient [email or web address] Password [email or web address]  10/03/2017, 1:13 AM

## 2017-10-03 NOTE — Progress Notes (Signed)
Assessed pt for midline.  RUE fracture, LUE from mid forearm to shoulder with infiltrate present.  CCM at bedside to place CVC.

## 2017-10-03 NOTE — ED Notes (Signed)
RN may call report to [facility] at 02:15 [phone]

## 2017-10-03 NOTE — Progress Notes (Signed)
Per FYI, IR to place PICC lines.  Morrie Sheldon and Dr Susie Cassette notified.

## 2017-10-03 NOTE — Progress Notes (Signed)
Informed Dr. Susie Cassette about pt HR around 110-120 and bp lowering when increasing Caridizem.  500cc bolus ordered and pain med ordered.  Erick Blinks, RN

## 2017-10-03 NOTE — Progress Notes (Signed)
Pharmacy Antibiotic Note  [name] is a 51 y.o. male admitted on 10/02/2017 with UTI.  Pharmacy has been consulted for meropenem dosing.  Plan: Meropenem 1 Gm IV q8h F/u scr/cultures     Temp (24hrs), Avg:98.7 F (37.1 C), Min:98.6 F (37 C), Max:98.8 F (37.1 C)   Recent Labs Lab 10/02/17 1622 10/02/17 1844  WBC 12.2*  --   CREATININE 1.53*  --   LATICACIDVEN  --  1.76    CrCl cannot be calculated (Unknown ideal weight.).    Allergies  Allergen Reactions  . Ativan [Lorazepam] Other (See Comments)    Unknown (not listed on physician's orders from [facility] 07/05/15)  . Latex Dermatitis  . Morphine And Related Other (See Comments)    Listed on physician's orders from [facility] 07/05/15  . Penicillins Other (See Comments)    Listed on physician's orders from [facility] 07/05/15 Has patient had a PCN reaction causing immediate rash, facial/tongue/throat swelling, SOB or lightheadedness with hypotension: unknown Has patient had a PCN reaction causing severe rash involving mucus membranes or skin necrosis: unknown Has patient had a PCN reaction that required hospitalization: no Has patient had a PCN reaction occurring within the last 10 years: yes If all of the above answers are "NO", then may proceed with Cephalosporin [place]  . Pyridium [Phenazopyridine Hcl] Other (See Comments)    Listed on physician's orders from [facility] 07/05/15  . Soy Allergy Other (See Comments)    Listed on physician's orders from [facility] 07/05/15    Antimicrobials this admission: 10/7 meropenem >>    >>   Dose adjustments this admission:   Microbiology results:  BCx:   UCx:    Sputum:    MRSA PCR:   Thank you for allowing pharmacy to be a part of this patient's care.  Lorenza Evangelist 10/03/2017 12:42 AM

## 2017-10-03 NOTE — Progress Notes (Signed)
Patient has no PIV access at this time. Left AC PIV from ED had infiltrated upon arrival- left arm is very swollen and reddened. IV team attempted to start a new PIV but was unsuccessful. Per IV team, day team will attempt when they arrive. On-call [facility] paged to be made aware of situation.

## 2017-10-04 ENCOUNTER — Inpatient Hospital Stay (HOSPITAL_COMMUNITY): Payer: Medicare Other

## 2017-10-04 LAB — PHOSPHORUS
Phosphorus: 3.3 mg/dL (ref 2.5–4.6)
Phosphorus: 3.6 mg/dL (ref 2.5–4.6)

## 2017-10-04 LAB — COMPREHENSIVE METABOLIC PANEL
ALK PHOS: 39 U/L (ref 38–126)
ALT: 8 U/L — ABNORMAL LOW (ref 17–63)
AST: 13 U/L — AB (ref 15–41)
Albumin: 2.6 g/dL — ABNORMAL LOW (ref 3.5–5.0)
Anion gap: 7 (ref 5–15)
BUN: 18 mg/dL (ref 6–20)
CALCIUM: 7.8 mg/dL — AB (ref 8.9–10.3)
CHLORIDE: 109 mmol/L (ref 101–111)
CO2: 26 mmol/L (ref 22–32)
CREATININE: 1.01 mg/dL (ref 0.61–1.24)
GFR calc Af Amer: >60 mL/min (ref >60)
Glucose, Bld: 87 mg/dL (ref 65–99)
Potassium: 3.7 mmol/L (ref 3.5–5.1)
Sodium: 142 mmol/L (ref 135–145)
Total Bilirubin: 0.7 mg/dL (ref 0.3–1.2)
Total Protein: 5.2 g/dL — ABNORMAL LOW (ref 6.5–8.1)

## 2017-10-04 LAB — BLOOD GAS, ARTERIAL
ACID-BASE EXCESS: 1.3 mmol/L (ref 0.0–2.0)
Bicarbonate: 25.3 mmol/L (ref 20.0–28.0)
Drawn by: 441261
O2 CONTENT: 2 L/min
O2 SAT: 98.1 %
PATIENT TEMPERATURE: 37
PO2 ART: 103 mmHg (ref 83.0–108.0)
pCO2 arterial: 40 mmHg (ref 32.0–48.0)
pH, Arterial: 7.418 (ref 7.350–7.450)

## 2017-10-04 LAB — TROPONIN I

## 2017-10-04 LAB — GLUCOSE, CAPILLARY
GLUCOSE-CAPILLARY: 92 mg/dL (ref 65–99)
GLUCOSE-CAPILLARY: 82 mg/dL (ref 65–99)
GLUCOSE-CAPILLARY: 99 mg/dL (ref 65–99)
GLUCOSE-CAPILLARY: 114 mg/dL — AB (ref 65–99)
Glucose-Capillary: 99 mg/dL (ref 65–99)
Glucose-Capillary: 94 mg/dL (ref 65–99)
Glucose-Capillary: 106 mg/dL — ABNORMAL HIGH (ref 65–99)

## 2017-10-04 LAB — PREPARE RBC (CROSSMATCH)

## 2017-10-04 LAB — IRON AND TIBC
IRON: 18 ug/dL — AB (ref 45–182)
Saturation Ratios: 11 % — ABNORMAL LOW (ref 17.9–39.5)
TIBC: 160 ug/dL — AB (ref 250–450)
UIBC: 142 ug/dL

## 2017-10-04 LAB — RETICULOCYTES
RBC.: 2.62 MIL/uL — ABNORMAL LOW (ref 4.22–5.81)
Retic Count, Absolute: 70.7 10*3/uL (ref 19.0–186.0)
Retic Ct Pct: 2.7 % (ref 0.4–3.1)

## 2017-10-04 LAB — MAGNESIUM
MAGNESIUM: 1.7 mg/dL (ref 1.7–2.4)
Magnesium: 2.1 mg/dL (ref 1.7–2.4)

## 2017-10-04 LAB — CBC
HEMATOCRIT: 22.3 % — AB (ref 39.0–52.0)
HEMOGLOBIN: 7.4 g/dL — AB (ref 13.0–17.0)
MCH: 28.6 pg (ref 26.0–34.0)
MCHC: 33.2 g/dL (ref 30.0–36.0)
MCV: 86.1 fL (ref 78.0–100.0)
PLATELETS: 80 10*3/uL — AB (ref 150–400)
RBC: 2.59 MIL/uL — AB (ref 4.22–5.81)
RDW: 15.2 % (ref 11.5–15.5)
WBC: 4.8 10*3/uL (ref 4.0–10.5)

## 2017-10-04 LAB — FOLATE: FOLATE: 7.9 ng/mL (ref >5.9)

## 2017-10-04 LAB — HIV ANTIBODY (ROUTINE TESTING W REFLEX): HIV Screen 4th Generation wRfx: NONREACTIVE

## 2017-10-04 LAB — FERRITIN: Ferritin: 176 ng/mL (ref 24–336)

## 2017-10-04 LAB — VITAMIN B12: VITAMIN B 12: 272 pg/mL (ref 180–914)

## 2017-10-04 LAB — PROCALCITONIN: Procalcitonin: <0.1 ng/mL

## 2017-10-04 MED ORDER — PANTOPRAZOLE SODIUM 40 MG IV SOLR
40.0000 mg | Freq: Two times a day (BID) | INTRAVENOUS | Status: DC
  Administered 2017-10-04 – 2017-10-05 (×4): 40 mg via INTRAVENOUS
  Filled 2017-10-04 (×4): qty 40

## 2017-10-04 MED ORDER — IOPAMIDOL (ISOVUE-300) INJECTION 61%
30.0000 mL | Freq: Once | INTRAVENOUS | Status: DC | PRN
  Administered 2017-10-04: 30 mL via ORAL
  Filled 2017-10-04: qty 30

## 2017-10-04 MED ORDER — HYDROCORTISONE NA SUCCINATE PF 100 MG IJ SOLR
50.0000 mg | Freq: Three times a day (TID) | INTRAMUSCULAR | Status: DC
  Administered 2017-10-04 – 2017-10-06 (×6): 50 mg via INTRAVENOUS
  Filled 2017-10-04 (×2): qty 1
  Filled 2017-10-04 (×2): qty 2
  Filled 2017-10-04 (×2): qty 1
  Filled 2017-10-04: qty 2

## 2017-10-04 MED ORDER — HYDROCORTISONE SOD SUCCINATE 100 MG PF FOR IT USE: 50.0000 mg | Freq: Three times a day (TID) | INTRAMUSCULAR | Status: DC

## 2017-10-04 MED ORDER — CHLORHEXIDINE GLUCONATE CLOTH 2 % EX PADS
6.0000 | MEDICATED_PAD | Freq: Every day | CUTANEOUS | Status: DC
  Administered 2017-10-04 – 2017-10-06 (×3): 6 via TOPICAL

## 2017-10-04 MED ORDER — MAGNESIUM SULFATE 2 GM/50ML IV SOLN
2.0000 g | Freq: Once | INTRAVENOUS | Status: AC
  Administered 2017-10-04: 2 g via INTRAVENOUS
  Filled 2017-10-04: qty 50

## 2017-10-04 MED ORDER — SODIUM CHLORIDE 0.9 % IV SOLN: Freq: Once | INTRAVENOUS | Status: DC

## 2017-10-04 MED ORDER — ACETAMINOPHEN 325 MG PO TABS: 650.0000 mg | ORAL_TABLET | Freq: Once | ORAL | Status: DC

## 2017-10-04 MED ORDER — IOPAMIDOL (ISOVUE-300) INJECTION 61%
INTRAVENOUS | Status: AC
  Filled 2017-10-04: qty 30

## 2017-10-04 NOTE — Care Management Note (Signed)
Case Management Note  Patient Details  Name: [name] MRN: [id number] Date of Birth: 1966-03-23  Subjective/Objective:                  A.fib,hypotension,anemia,fall,  Action/Plan: Date:  October 04, 2017 Chart reviewed for concurrent status and case management needs.  Will continue to follow patient progress.  Discharge Planning: following for needs  Expected discharge date: [id number]  Marcelle Smiling, BSN, [place], [place]   [phone]   Expected Discharge Date:                  Expected Discharge Plan:  Home/Self Care  In-House Referral:     Discharge planning Services  CM Consult  Post Acute Care Choice:    Choice offered to:     DME Arranged:    DME Agency:     HH Arranged:    HH Agency:     Status of Service:  In process, will continue to follow  If discussed at Long Length of Stay Meetings, dates discussed:    Additional Comments:  Golda Acre, RN 10/04/2017, 8:47 AM

## 2017-10-04 NOTE — Progress Notes (Addendum)
[facility] PROGRESS NOTE  Kanin Lia ZDG:[id number] DOB: Jun 17, 1966 DOA: 10/02/2017   PCP: Georgann Housekeeper, MD     Assessment/Plan: Principal Problem:   Acute lower UTI Active Problems:   Anoxic brain injury (HCC)   History of atrial fibrillation   FTT (failure to thrive) in adult   Addisons disease (HCC)   Fracture of humerus anatomical neck, right, closed, initial encounter   AKI (acute kidney injury) (HCC)   UTI (urinary tract infection)   50 y.o.malewith a past medical history significant for TBI, seizures, anoxic brain injury, quadriplegia, atrial fibrillation currently normal sinus rhythm, hypothyroidism, cardiomyopathy, Addison's disease, PEG tube, and hyperlipidemia who presents with right arm pain after a fall. EMS reports that patient had a fall out of bed at some point overnight.According to EMS, patient had imaging at his rehabilitation facility, [facility], where he was found to have either a fracture or dislocation of the right humerus.  Patient also found to be tachycardic and hypotensive hydrated with IV fluids Chest x-ray showed no evidence of pneumonia. Shoulder x-ray revealed humeral neck fracture. Patient will be placed into a splint and follow-up with orthopedics for this.  CT of the head revealed no acute intracranial abnormality. No evidence of cervical spine fracture.   Patient found to have a UTI, started on meropenem  Assessment and plan Urinary tract infection/probable sepsis present on admission -history of ESBL, continue meropenem, follow results of urine culture, urine culture positive for gram-negative rods Blood culture 2 given patient is still febrile Stress dose steroids CT abdomen pelvis to rule out nephrolithiasis Check pro-calcitonin  History of atrial fibrillation-went from normal sinus rhythm and sinus tachycardia to  A fib , likely secondary to the above, transferred  patient to stepdown, patient started on  cardizem gtt for rate control. Currently in A. fib but rate is controlled. Status post central line placement by PCCM and still on cardizem gtt , HR improved   .BP soft , requiring stress dose steroids and fluid boluses ,Hold off on anticoagulation given anemia    History of Addison's disease-not and chronic steroids, a.m. Cortisol around 11. Will start patient on stress dose steroids given her low blood pressure  History of traumatic brain injury, continue with seizure prophylaxis, psychiatric medications  History of seizure disorder-continue Depakote, Depakote level 28  Fracture of the right humerus-Acute minimally displaced, likely impacted, potentially mildly comminuted fracture of the surgical neck of the right humerus, Dr. Shon Baton on call for [facility] has been consulted. They recommend medical management and follow-up with Dr. Aundria Rud. Arm sling for 2 weeks  Acute kidney injury-baseline creatinine around 0.9, presented with a creatinine of 1.53, improving  Anemia of Chronic Disease, including the associated chronic disease state -currently patient has no obvious bleeding, baseline hemoglobin was 12.2 on 06/04/17. Hemoglobin currently 7.4. Will obtain CT abdomen pelvis to rule out retroperitoneal bleed.   DVT prophylaxsis  Lovenox discontinued due to thrombocytopenia  Code Status:   Full code    Family Communication: Discussed in detail with the patient, all imaging results, lab results explained to the patient   Disposition Plan:   Continue stepdown, high risk for decompensation       Consultants:  None  Procedures:  None  Antibiotics: Anti-infectives    Start     Dose/Rate Route Frequency Ordered Stop   10/03/17 0100  meropenem (MERREM) 1 g in sodium chloride 0.9 % 100 mL IVPB     1 g 200  mL/hr over 30 Minutes Intravenous Every 8 hours 10/03/17 0040     10/03/17 0000  aztreonam (AZACTAM) 1 g in dextrose 5 % 50 mL IVPB  Status:  Discontinued     1 g 100 mL/hr  over 30 Minutes Intravenous  Once 10/02/17 2354 10/03/17 0039         HPI/Subjective: Patient lethargic but arousable, blood pressure soft,  Objective: Vitals:   10/04/17 0900 10/04/17 1000 10/04/17 1100 10/04/17 1200  BP: 106/62 (!) 92/41 107/64 103/60  Pulse: 97 (!) 106 (!) 104 (!) 102  Resp: (!) 24 (!) 22 17 20   Temp:    98.7 F (37.1 C)  TempSrc:    Axillary  SpO2:      Weight:      Height:        Intake/Output Summary (Last 24 hours) at 10/04/17 1443 Last data filed at 10/04/17 0900  Gross per 24 hour  Intake              300 ml  Output             1150 ml  Net             -850 ml    Exam:  Examination:  General exam:  Chronically ill-appearing, arousable to voice Respiratory system: Clear to auscultation. Respiratory effort normal. Cardiovascular system: S1 & S2 heard, RRR. No JVD, murmurs, rubs, gallops or clicks. No pedal edema. Gastrointestinal system: Abdomen is nondistended, soft and nontender. No organomegaly or masses felt. Normal bowel sounds heard. Central nervous system: Awake and oriented to self, No focal neurological deficits. Extremities: Symmetric 5 x 5 power. Skin: No rashes, lesions or ulcers      Data Reviewed: I have personally reviewed following labs and imaging studies  Micro Results Recent Results (from the past 240 hour(s))  Urine culture     Status: Abnormal (Preliminary result)   Collection Time: 10/02/17 11:25 PM  Result Value Ref Range Status   Specimen Description URINE, CATHETERIZED  Final   Special Requests NONE  Final   Culture >=100,000 COLONIES/mL ESCHERICHIA COLI (A)  Final   Report Status PENDING  Incomplete  MRSA PCR Screening     Status: None   Collection Time: 10/03/17  3:19 AM  Result Value Ref Range Status   MRSA by PCR NEGATIVE NEGATIVE Final    Comment:        The GeneXpert MRSA Assay (FDA approved for NASAL specimens only), is one component of a comprehensive MRSA colonization surveillance program. It  is not intended to diagnose MRSA infection nor to guide or monitor treatment for MRSA infections.     Radiology Reports Dg Chest 2 View  Result Date: 10/02/2017 CLINICAL DATA:  Status post fall out of bed, with right shoulder pain and bruising. Initial encounter. EXAM: CHEST  2 VIEW COMPARISON:  Chest radiograph performed 01/30/2015 FINDINGS: There is stable elevation of the right hemidiaphragm. The lungs are well-aerated and clear. There is no evidence of focal opacification, pleural effusion or pneumothorax. The cardiomediastinal silhouette is within normal limits. No acute osseous abnormalities are seen. IMPRESSION: Stable elevation of the right hemidiaphragm. Lungs remain grossly clear. No displaced rib fractures identified. Electronically Signed   By: Roanna Raider M.D.   On: 10/02/2017 17:25   Dg Shoulder Right  Result Date: 10/02/2017 CLINICAL DATA:  51 year old male with history of trauma after falling out of bed yesterday evening complaining of pain in the right shoulder. EXAM: RIGHT SHOULDER - 2+  VIEW COMPARISON:  No priors. FINDINGS: Three views of the right shoulder demonstrate an acute displaced likely mildly impacted fracture of the surgical neck of the humerus, with approximately 8 mm of medial displacement of the distal fracture fragment. There may be some mild comminution of the fracture. The humeral head appears rotated in the glenoid fossa, but remains located. Other visualized bones appear intact. IMPRESSION: 1. Acute minimally displaced, likely impacted, potentially mildly comminuted fracture of the surgical neck of the right humerus, as above. Electronically Signed   By: Trudie Reed M.D.   On: 10/02/2017 17:29   Ct Head Wo Contrast  Result Date: 10/02/2017 CLINICAL DATA:  Patient fell out of bed last evening. Altered mentation and slow verbal communication. EXAM: CT HEAD WITHOUT CONTRAST CT CERVICAL SPINE WITHOUT CONTRAST TECHNIQUE: Multidetector CT imaging of the head  and cervical spine was performed following the standard protocol without intravenous contrast. Multiplanar CT image reconstructions of the cervical spine were also generated. COMPARISON:  06/07/2017 FINDINGS: CT HEAD FINDINGS Brain: Mild superficial and central atrophy with chronic appearing small vessel ischemic disease. No acute intracranial hemorrhage, midline shift or edema. No large vascular territory infarct. No intracranial hemorrhage, intra-axial mass nor extra-axial fluid collection. No midline shift or mass effect. No hydrocephalus. Vascular: No hyperdense vessel or unexpected calcification. Skull: Normal. Negative for fracture or focal lesion. Sinuses/Orbits: Minimal ethmoid and right maxillary sinus mucosal thickening. Clear mastoids. Intact orbits and globes. Other: None CT CERVICAL SPINE FINDINGS Alignment: Slight reversal cervical lordosis attributable to multilevel degenerative disc disease. Intact craniocervical relationship and atlantodental interval. Skull base and vertebrae: Skull base appears intact. Normal C1-C2 relationship. Intact dense. No acute fracture or suspicious osseous lesions. Soft tissues and spinal canal: No prevertebral fluid or swelling. No visible canal hematoma. Disc levels: Multilevel degenerative disc disease mild to moderate at C2-3 and C3-4 and moderate-to-marked from Celsius 4 through C7. No jumped or perched facets. Mild right side foraminal narrowing at C4-5, C5-6 and C6-7 due to uncovertebral joint and endplate degenerative spurring Upper chest: Negative. Other: None IMPRESSION: 1. No acute intracranial abnormality. 2. Stable atrophy with chronic minimal small vessel ischemic disease. 3. Chronic stable cervical spondylosis without acute cervical spine fracture or posttraumatic subluxation. Electronically Signed   By: Tollie Eth M.D.   On: 10/02/2017 17:05   Ct Cervical Spine Wo Contrast  Result Date: 10/02/2017 CLINICAL DATA:  Patient fell out of bed last evening.  Altered mentation and slow verbal communication. EXAM: CT HEAD WITHOUT CONTRAST CT CERVICAL SPINE WITHOUT CONTRAST TECHNIQUE: Multidetector CT imaging of the head and cervical spine was performed following the standard protocol without intravenous contrast. Multiplanar CT image reconstructions of the cervical spine were also generated. COMPARISON:  06/07/2017 FINDINGS: CT HEAD FINDINGS Brain: Mild superficial and central atrophy with chronic appearing small vessel ischemic disease. No acute intracranial hemorrhage, midline shift or edema. No large vascular territory infarct. No intracranial hemorrhage, intra-axial mass nor extra-axial fluid collection. No midline shift or mass effect. No hydrocephalus. Vascular: No hyperdense vessel or unexpected calcification. Skull: Normal. Negative for fracture or focal lesion. Sinuses/Orbits: Minimal ethmoid and right maxillary sinus mucosal thickening. Clear mastoids. Intact orbits and globes. Other: None CT CERVICAL SPINE FINDINGS Alignment: Slight reversal cervical lordosis attributable to multilevel degenerative disc disease. Intact craniocervical relationship and atlantodental interval. Skull base and vertebrae: Skull base appears intact. Normal C1-C2 relationship. Intact dense. No acute fracture or suspicious osseous lesions. Soft tissues and spinal canal: No prevertebral fluid or swelling. No visible canal hematoma.  Disc levels: Multilevel degenerative disc disease mild to moderate at C2-3 and C3-4 and moderate-to-marked from Celsius 4 through C7. No jumped or perched facets. Mild right side foraminal narrowing at C4-5, C5-6 and C6-7 due to uncovertebral joint and endplate degenerative spurring Upper chest: Negative. Other: None IMPRESSION: 1. No acute intracranial abnormality. 2. Stable atrophy with chronic minimal small vessel ischemic disease. 3. Chronic stable cervical spondylosis without acute cervical spine fracture or posttraumatic subluxation. Electronically  Signed   By: Tollie Eth M.D.   On: 10/02/2017 17:05   Dg Chest Port 1 View  Result Date: 10/03/2017 CLINICAL DATA:  51 year old male status post central line placement. EXAM: PORTABLE CHEST 1 VIEW COMPARISON:  Chest x-ray 10/02/2017. FINDINGS: There is a left-sided subclavian central venous catheter with tip terminating in the superior cavoatrial junction. Study is limited by lack of visualization of the left lung base. With this limitation in mind, lung volumes are very low. Elevation of the right hemidiaphragm is unchanged. No acute consolidative airspace disease. No definite pleural effusions (although a small left pleural effusion cannot be excluded secondary to the limitations of today's study). No evidence of pulmonary edema. Heart size is normal. Mediastinal contours are unremarkable. No definite pneumothorax. IMPRESSION: 1. New left subclavian central venous catheter with tip at the superior cavoatrial junction. 2. Persistent low lung volumes. Electronically Signed   By: Trudie Reed M.D.   On: 10/03/2017 13:17   Dg Humerus Right  Result Date: 10/02/2017 CLINICAL DATA:  51 year old male with history of trauma after falling out of bed yesterday evening. Pain in the right shoulder. EXAM: RIGHT HUMERUS - 2+ VIEW COMPARISON:  No priors. FINDINGS: Three views of the right shoulder demonstrate an acute displaced likely mildly impacted fracture of the surgical neck of the humerus, with approximately 8 mm of medial displacement of the distal fracture fragment. There may be some mild comminution of the fracture. The humeral head appears rotated in the glenoid fossa, but remains located. Distal aspect of the humerus is otherwise intact. IMPRESSION: 1. Acute mildly displaced, likely impacted, potentially mildly comminuted fracture of the surgical neck of the left humerus, as above. Electronically Signed   By: Trudie Reed M.D.   On: 10/02/2017 17:28     CBC  Recent Labs Lab 10/02/17 1622  10/03/17 0627 10/04/17 0435  WBC 12.2* 8.2 4.8  HGB 11.8* 10.3* 7.4*  HCT 35.9* 31.5* 22.3*  PLT 118* 122* 80*  MCV 85.7 86.1 86.1  MCH 28.2 28.1 28.6  MCHC 32.9 32.7 33.2  RDW 14.9 15.2 15.2  LYMPHSABS 2.1  --   --   MONOABS 1.5*  --   --   EOSABS 0.0  --   --   BASOSABS 0.0  --   --     Chemistries   Recent Labs Lab 10/02/17 1622 10/03/17 0627 10/03/17 0854 10/03/17 1700 10/04/17 0435  NA 139 141  --   --  142  K 4.6 4.3  --   --  3.7  CL 103 106  --   --  109  CO2 25 24  --   --  26  GLUCOSE 94 85  --   --  87  BUN 17 18  --   --  18  CREATININE 1.53* 1.33*  --   --  1.01  CALCIUM 8.9 8.6*  --   --  7.8*  MG  --   --  1.6* 1.6* 1.7  AST 20  --   --   --  13*  ALT 7*  --   --   --  8*  ALKPHOS 62  --   --   --  39  BILITOT 0.7  --   --   --  0.7   ------------------------------------------------------------------------------------------------------------------ estimated creatinine clearance is 111.1 mL/min (by C-G formula based on SCr of 1.01 mg/dL). ------------------------------------------------------------------------------------------------------------------ No results for input(s): HGBA1C in the last 72 hours. ------------------------------------------------------------------------------------------------------------------ No results for input(s): CHOL, HDL, LDLCALC, TRIG, CHOLHDL, LDLDIRECT in the last 72 hours. ------------------------------------------------------------------------------------------------------------------ No results for input(s): TSH, T4TOTAL, T3FREE, THYROIDAB in the last 72 hours.  Invalid input(s): FREET3 ------------------------------------------------------------------------------------------------------------------ No results for input(s): VITAMINB12, FOLATE, FERRITIN, TIBC, IRON, RETICCTPCT in the last 72 hours.  Coagulation profile  Recent Labs Lab 10/02/17 1622  INR 1.04    No results for input(s): DDIMER in the last  72 hours.  Cardiac Enzymes No results for input(s): CKMB, TROPONINI, MYOGLOBIN in the last 168 hours.  Invalid input(s): CK ------------------------------------------------------------------------------------------------------------------ Invalid input(s): POCBNP   CBG:  Recent Labs Lab 10/03/17 1931 10/03/17 2324 10/04/17 0334 10/04/17 0730 10/04/17 1139  GLUCAP 99 109* 92 82 99       Studies: Dg Chest 2 View  Result Date: 10/02/2017 CLINICAL DATA:  Status post fall out of bed, with right shoulder pain and bruising. Initial encounter. EXAM: CHEST  2 VIEW COMPARISON:  Chest radiograph performed 01/30/2015 FINDINGS: There is stable elevation of the right hemidiaphragm. The lungs are well-aerated and clear. There is no evidence of focal opacification, pleural effusion or pneumothorax. The cardiomediastinal silhouette is within normal limits. No acute osseous abnormalities are seen. IMPRESSION: Stable elevation of the right hemidiaphragm. Lungs remain grossly clear. No displaced rib fractures identified. Electronically Signed   By: Roanna Raider M.D.   On: 10/02/2017 17:25   Dg Shoulder Right  Result Date: 10/02/2017 CLINICAL DATA:  51 year old male with history of trauma after falling out of bed yesterday evening complaining of pain in the right shoulder. EXAM: RIGHT SHOULDER - 2+ VIEW COMPARISON:  No priors. FINDINGS: Three views of the right shoulder demonstrate an acute displaced likely mildly impacted fracture of the surgical neck of the humerus, with approximately 8 mm of medial displacement of the distal fracture fragment. There may be some mild comminution of the fracture. The humeral head appears rotated in the glenoid fossa, but remains located. Other visualized bones appear intact. IMPRESSION: 1. Acute minimally displaced, likely impacted, potentially mildly comminuted fracture of the surgical neck of the right humerus, as above. Electronically Signed   By: Trudie Reed  M.D.   On: 10/02/2017 17:29   Ct Head Wo Contrast  Result Date: 10/02/2017 CLINICAL DATA:  Patient fell out of bed last evening. Altered mentation and slow verbal communication. EXAM: CT HEAD WITHOUT CONTRAST CT CERVICAL SPINE WITHOUT CONTRAST TECHNIQUE: Multidetector CT imaging of the head and cervical spine was performed following the standard protocol without intravenous contrast. Multiplanar CT image reconstructions of the cervical spine were also generated. COMPARISON:  06/07/2017 FINDINGS: CT HEAD FINDINGS Brain: Mild superficial and central atrophy with chronic appearing small vessel ischemic disease. No acute intracranial hemorrhage, midline shift or edema. No large vascular territory infarct. No intracranial hemorrhage, intra-axial mass nor extra-axial fluid collection. No midline shift or mass effect. No hydrocephalus. Vascular: No hyperdense vessel or unexpected calcification. Skull: Normal. Negative for fracture or focal lesion. Sinuses/Orbits: Minimal ethmoid and right maxillary sinus mucosal thickening. Clear mastoids. Intact orbits and globes. Other: None CT CERVICAL SPINE FINDINGS Alignment: Slight reversal cervical lordosis attributable  to multilevel degenerative disc disease. Intact craniocervical relationship and atlantodental interval. Skull base and vertebrae: Skull base appears intact. Normal C1-C2 relationship. Intact dense. No acute fracture or suspicious osseous lesions. Soft tissues and spinal canal: No prevertebral fluid or swelling. No visible canal hematoma. Disc levels: Multilevel degenerative disc disease mild to moderate at C2-3 and C3-4 and moderate-to-marked from Celsius 4 through C7. No jumped or perched facets. Mild right side foraminal narrowing at C4-5, C5-6 and C6-7 due to uncovertebral joint and endplate degenerative spurring Upper chest: Negative. Other: None IMPRESSION: 1. No acute intracranial abnormality. 2. Stable atrophy with chronic minimal small vessel ischemic  disease. 3. Chronic stable cervical spondylosis without acute cervical spine fracture or posttraumatic subluxation. Electronically Signed   By: Tollie Eth M.D.   On: 10/02/2017 17:05   Ct Cervical Spine Wo Contrast  Result Date: 10/02/2017 CLINICAL DATA:  Patient fell out of bed last evening. Altered mentation and slow verbal communication. EXAM: CT HEAD WITHOUT CONTRAST CT CERVICAL SPINE WITHOUT CONTRAST TECHNIQUE: Multidetector CT imaging of the head and cervical spine was performed following the standard protocol without intravenous contrast. Multiplanar CT image reconstructions of the cervical spine were also generated. COMPARISON:  06/07/2017 FINDINGS: CT HEAD FINDINGS Brain: Mild superficial and central atrophy with chronic appearing small vessel ischemic disease. No acute intracranial hemorrhage, midline shift or edema. No large vascular territory infarct. No intracranial hemorrhage, intra-axial mass nor extra-axial fluid collection. No midline shift or mass effect. No hydrocephalus. Vascular: No hyperdense vessel or unexpected calcification. Skull: Normal. Negative for fracture or focal lesion. Sinuses/Orbits: Minimal ethmoid and right maxillary sinus mucosal thickening. Clear mastoids. Intact orbits and globes. Other: None CT CERVICAL SPINE FINDINGS Alignment: Slight reversal cervical lordosis attributable to multilevel degenerative disc disease. Intact craniocervical relationship and atlantodental interval. Skull base and vertebrae: Skull base appears intact. Normal C1-C2 relationship. Intact dense. No acute fracture or suspicious osseous lesions. Soft tissues and spinal canal: No prevertebral fluid or swelling. No visible canal hematoma. Disc levels: Multilevel degenerative disc disease mild to moderate at C2-3 and C3-4 and moderate-to-marked from Celsius 4 through C7. No jumped or perched facets. Mild right side foraminal narrowing at C4-5, C5-6 and C6-7 due to uncovertebral joint and endplate  degenerative spurring Upper chest: Negative. Other: None IMPRESSION: 1. No acute intracranial abnormality. 2. Stable atrophy with chronic minimal small vessel ischemic disease. 3. Chronic stable cervical spondylosis without acute cervical spine fracture or posttraumatic subluxation. Electronically Signed   By: Tollie Eth M.D.   On: 10/02/2017 17:05   Dg Chest Port 1 View  Result Date: 10/03/2017 CLINICAL DATA:  51 year old male status post central line placement. EXAM: PORTABLE CHEST 1 VIEW COMPARISON:  Chest x-ray 10/02/2017. FINDINGS: There is a left-sided subclavian central venous catheter with tip terminating in the superior cavoatrial junction. Study is limited by lack of visualization of the left lung base. With this limitation in mind, lung volumes are very low. Elevation of the right hemidiaphragm is unchanged. No acute consolidative airspace disease. No definite pleural effusions (although a small left pleural effusion cannot be excluded secondary to the limitations of today's study). No evidence of pulmonary edema. Heart size is normal. Mediastinal contours are unremarkable. No definite pneumothorax. IMPRESSION: 1. New left subclavian central venous catheter with tip at the superior cavoatrial junction. 2. Persistent low lung volumes. Electronically Signed   By: Trudie Reed M.D.   On: 10/03/2017 13:17   Dg Humerus Right  Result Date: 10/02/2017 CLINICAL DATA:  51 year old male with  history of trauma after falling out of bed yesterday evening. Pain in the right shoulder. EXAM: RIGHT HUMERUS - 2+ VIEW COMPARISON:  No priors. FINDINGS: Three views of the right shoulder demonstrate an acute displaced likely mildly impacted fracture of the surgical neck of the humerus, with approximately 8 mm of medial displacement of the distal fracture fragment. There may be some mild comminution of the fracture. The humeral head appears rotated in the glenoid fossa, but remains located. Distal aspect of the  humerus is otherwise intact. IMPRESSION: 1. Acute mildly displaced, likely impacted, potentially mildly comminuted fracture of the surgical neck of the left humerus, as above. Electronically Signed   By: Trudie Reed M.D.   On: 10/02/2017 17:28      Lab Results  Component Value Date   HGBA1C 4.6 11/24/2011   HGBA1C 5.0 10/05/2011   HGBA1C (L) 06/19/2010    4.3 (NOTE)                                                                       According to the ADA Clinical Practice Recommendations for 2011, when HbA1c is used as a screening test:   >=6.5%   Diagnostic of Diabetes Mellitus           (if abnormal result  is confirmed)  5.7-6.4%   Increased risk of developing Diabetes Mellitus  References:Diagnosis and Classification of Diabetes Mellitus,Diabetes Care,2011,34(Suppl 1):S62-S69 and Standards of Medical Care in         Diabetes - 2011,Diabetes Care,2011,34  (Suppl 1):S11-S61.   Lab Results  Component Value Date   CREATININE 1.01 10/04/2017       Scheduled Meds: . acetaminophen  650 mg Oral Once  . Chlorhexidine Gluconate Cloth  6 each Topical Q0600  . divalproex  500 mg Oral TID  . feeding supplement (VITAL AF 1.2 CAL)  237 mL Per Tube QID  . free water  240 mL Per Tube Q6H  . hydrocortisone sod succinate (SOLU-CORTEF) inj  50 mg Intravenous Q8H  . Influenza vac split quadrivalent PF  0.5 mL Intramuscular Tomorrow-1000  . levothyroxine  200 mcg Oral QAC breakfast  . multivitamin  15 mL Per Tube Q1200  . pantoprazole (PROTONIX) IV  40 mg Intravenous Q12H  . PARoxetine  20 mg Oral Daily  . polyethylene glycol  17 g Per Tube BID  . polyvinyl alcohol  1 drop Both Eyes QID  . risperiDONE  2 mg Sublingual BID  . sennosides  15 mL Oral BID  . traZODone  50 mg Oral BID   Continuous Infusions: . sodium chloride    . sodium chloride    . diltiazem (CARDIZEM) infusion Stopped (10/04/17 0602)  . magnesium sulfate 1 - 4 g bolus IVPB    . meropenem (MERREM) IV Stopped (10/04/17  1610)     LOS: 1 day    Time spent: >30 MINS    Richarda Overlie  [facility] Pager [phone]. If 7PM-7AM, please contact night-coverage at [email or web address], password [facility] 10/04/2017, 2:43 PM  LOS: 1 day

## 2017-10-04 NOTE — Consult Note (Signed)
WOC Nurse wound consult note Reason for Consult: Consult requested for abd wound.   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 Measurement: .8X.8X6cm, located near umbilicus Wound bed: red and moist Drainage (amount, consistency, odor) mod amt greenish-tan drainage, some odor Periwound: Intact skin surrounding Dressing procedure/placement/frequency: Aquacel to absorb drainage and provide antimicrobial benefits. No family at the bedside to discuss plan of care. Please re-consult if further assistance is needed.  Thank-you,  Cammie Mcgee MSN, RN, CWOCN, [place], CNS [phone]

## 2017-10-05 ENCOUNTER — Inpatient Hospital Stay (HOSPITAL_COMMUNITY): Payer: Medicare Other

## 2017-10-05 DIAGNOSIS — I4891 Unspecified atrial fibrillation: Secondary | ICD-10-CM

## 2017-10-05 LAB — PROCALCITONIN

## 2017-10-05 LAB — CBC
HCT: 25.1 % — ABNORMAL LOW (ref 39.0–52.0)
Hemoglobin: 8.5 g/dL — ABNORMAL LOW (ref 13.0–17.0)
MCH: 29.4 pg (ref 26.0–34.0)
MCHC: 33.9 g/dL (ref 30.0–36.0)
MCV: 86.9 fL (ref 78.0–100.0)
PLATELETS: 82 10*3/uL — AB (ref 150–400)
RBC: 2.89 MIL/uL — AB (ref 4.22–5.81)
RDW: 14.6 % (ref 11.5–15.5)
WBC: 5.5 10*3/uL (ref 4.0–10.5)

## 2017-10-05 LAB — GLUCOSE, CAPILLARY
GLUCOSE-CAPILLARY: 95 mg/dL (ref 65–99)
GLUCOSE-CAPILLARY: 108 mg/dL — AB (ref 65–99)
Glucose-Capillary: 102 mg/dL — ABNORMAL HIGH (ref 65–99)
Glucose-Capillary: 112 mg/dL — ABNORMAL HIGH (ref 65–99)
Glucose-Capillary: 90 mg/dL (ref 65–99)
Glucose-Capillary: 96 mg/dL (ref 65–99)

## 2017-10-05 LAB — ECHOCARDIOGRAM COMPLETE
CHL CUP MV DEC (S): 201
E decel time: 201 msec
FS: 30 % (ref 28–44)
HEIGHTINCHES: 71 in
IV/PV OW: 0.96
LA diam end sys: 36 mm
LA vol A4C: 36 ml
LADIAMINDEX: 1.57 cm/m2
LASIZE: 36 mm
LV PW d: 11.1 mm — AB (ref 0.6–1.1)
LVOT SV: 86 mL
LVOT VTI: 16.2 cm
LVOT area: 5.31 cm2
LVOT diameter: 26 mm
LVOT peak vel: 86.7 cm/s
Lateral S' vel: 10.3 cm/s
MV pk A vel: 62.2 m/s
MVPG: 2 mmHg
MVPKEVEL: 77.1 m/s
TAPSE: 27.7 mm
WEIGHTICAEL: 3922.42 [oz_av]

## 2017-10-05 LAB — COMPREHENSIVE METABOLIC PANEL
ALBUMIN: 2.5 g/dL — AB (ref 3.5–5.0)
ALT: 7 U/L — AB (ref 17–63)
AST: 12 U/L — AB (ref 15–41)
Alkaline Phosphatase: 42 U/L (ref 38–126)
Anion gap: 7 (ref 5–15)
BUN: 12 mg/dL (ref 6–20)
CALCIUM: 8 mg/dL — AB (ref 8.9–10.3)
CHLORIDE: 107 mmol/L (ref 101–111)
CO2: 25 mmol/L (ref 22–32)
Creatinine, Ser: 0.71 mg/dL (ref 0.61–1.24)
GFR calc Af Amer: >60 mL/min (ref >60)
GLUCOSE: 94 mg/dL (ref 65–99)
Potassium: 4.1 mmol/L (ref 3.5–5.1)
SODIUM: 139 mmol/L (ref 135–145)
TOTAL PROTEIN: 5.5 g/dL — AB (ref 6.5–8.1)
Total Bilirubin: 0.7 mg/dL (ref 0.3–1.2)

## 2017-10-05 LAB — URINE CULTURE: Culture: >=100000 — AB

## 2017-10-05 LAB — OCCULT BLOOD X 1 CARD TO LAB, STOOL: FECAL OCCULT BLD: NEGATIVE

## 2017-10-05 LAB — TROPONIN I

## 2017-10-05 MED ORDER — SENNOSIDES 8.8 MG/5ML PO SYRP
15.0000 mL | ORAL_SOLUTION | Freq: Two times a day (BID) | ORAL | Status: DC
  Administered 2017-10-05 – 2017-10-07 (×4): 15 mL
  Filled 2017-10-05 (×5): qty 15

## 2017-10-05 MED ORDER — METOPROLOL TARTRATE 12.5 MG HALF TABLET
12.5000 mg | ORAL_TABLET | Freq: Two times a day (BID) | ORAL | Status: DC
  Administered 2017-10-05: 12.5 mg via ORAL
  Filled 2017-10-05: qty 1

## 2017-10-05 MED ORDER — LEVOTHYROXINE SODIUM 100 MCG PO TABS
200.0000 ug | ORAL_TABLET | Freq: Every day | ORAL | Status: DC
  Administered 2017-10-06 – 2017-10-07 (×2): 200 ug
  Filled 2017-10-05 (×2): qty 2

## 2017-10-05 MED ORDER — HALOPERIDOL LACTATE 5 MG/ML IJ SOLN: 2.5000 mg | Freq: Once | INTRAMUSCULAR | Status: DC

## 2017-10-05 MED ORDER — RESOURCE THICKENUP CLEAR PO POWD
ORAL | Status: DC | PRN
  Filled 2017-10-05: qty 125

## 2017-10-05 MED ORDER — METOPROLOL TARTRATE 12.5 MG HALF TABLET
12.5000 mg | ORAL_TABLET | Freq: Two times a day (BID) | ORAL | Status: DC
  Administered 2017-10-05 – 2017-10-07 (×4): 12.5 mg
  Filled 2017-10-05 (×4): qty 1

## 2017-10-05 MED ORDER — TRAZODONE HCL 50 MG PO TABS
50.0000 mg | ORAL_TABLET | Freq: Two times a day (BID) | ORAL | Status: DC
  Administered 2017-10-05 – 2017-10-07 (×4): 50 mg
  Filled 2017-10-05 (×4): qty 1

## 2017-10-05 MED ORDER — PAROXETINE HCL 20 MG PO TABS
20.0000 mg | ORAL_TABLET | Freq: Every day | ORAL | Status: DC
  Administered 2017-10-06 – 2017-10-07 (×2): 20 mg
  Filled 2017-10-05 (×2): qty 1

## 2017-10-05 NOTE — Progress Notes (Signed)
[facility] PROGRESS NOTE  [name] [name]:[id number] DOB: [birth year] DOA: 10/02/2017   PCP: Georgann Housekeeper, MD     Assessment/Plan: Principal Problem:   Acute lower UTI Active Problems:   Anoxic brain injury (HCC)   History of atrial fibrillation   FTT (failure to thrive) in adult   Addisons disease (HCC)   Fracture of humerus anatomical neck, right, closed, initial encounter   AKI (acute kidney injury) (HCC)   UTI (urinary tract infection)   51 y.o.malewith a past medical history significant for TBI, seizures, anoxic brain injury, quadriplegia, atrial fibrillation currently normal sinus rhythm, hypothyroidism, cardiomyopathy, Addison's disease, PEG tube, and hyperlipidemia who presents with right arm pain after a fall. EMS reports that patient had a fall out of bed at some point overnight.According to EMS, patient had imaging at his rehabilitation facility, [facility], where he was found to have either a fracture or dislocation of the right humerus. Patient found to have  tachycardic and hypotensive ,Chest x-ray showed no evidence of pneumonia. Shoulder x-ray revealed humeral neck fracture.    Patient found to have a UTI, started on meropenem. Subsequently transferred to stepdown due to atrial fibrillation with rapid ventricular response, hypotension. Now improving transferred back to telemetry  Assessment and plan Urinary tract infection/probable sepsis present on admission -history of ESBL, urine culture positive for Escherichia coli greater than 100,000 colonies, continue meropenem, Blood culture 2 , no growth so far Stress dose steroids secondary to hypotension, blood pressure has since improved. Taper stress dose steroids gradually CT abdomen pelvis to rule out nephrolithiasis Pro calcitonin negative Renal ultrasound shows no hydronephrosis  History of atrial fibrillation-went from normal sinus rhythm and sinus tachycardia to  A fib , likely secondary  to the above, transferred  patient to stepdown, patient started on cardizem gtt for rate control. Currently in A. fib but rate is controlled. Status post central line placement by PCCM , off Cardizem drip, resume home dose of metoprolol, HR improved   . Hold off on anticoagulation given anemia    History of Addison's disease-not and chronic steroids, a.m. Cortisol around 11. Started patient on stress dose steroids given   low blood pressure  History of traumatic brain injury, continue with seizure prophylaxis, psychiatric medications  History of seizure disorder-continue Depakote, Depakote level 28  Fracture of the right humerus-Acute minimally displaced, likely impacted, potentially mildly comminuted fracture of the surgical neck of the right humerus, Dr. Shon Baton on call for [facility] has been consulted. They recommend medical management and follow-up with Dr. Aundria Rud. Arm sling for 2 weeks  Acute kidney injury-baseline creatinine around 0.9, presented with a creatinine of 1.53, improving  Anemia of Chronic Disease, including the associated chronic disease state/acute blood loss anemia -currently patient has no obvious bleeding, baseline hemoglobin was 12.2 on 06/04/17. Hemoglobin dropped to 7.4. Hemoglobin 8.5 after transfusion of one unit of packed red blood cells.    Anterior abdominal wall abscess on CT-Gen. surgery consulted to evaluate. This also may be a hematoma in the setting of his fall   DVT prophylaxsis  Lovenox discontinued due to thrombocytopenia  Code Status:   Full code    Family Communication: Discussed in detail with the patient, all imaging results, lab results explained to the patient   Disposition Plan:  Transfer to telemetry later this afternoon, social work consult for disposition      Consultants:  General surgery  Procedures:  None  Antibiotics: Anti-infectives    Start  Dose/Rate Route Frequency Ordered Stop   10/03/17 0100  meropenem  (MERREM) 1 g in sodium chloride 0.9 % 100 mL IVPB     1 g 200 mL/hr over 30 Minutes Intravenous Every 8 hours 10/03/17 0040     10/03/17 0000  aztreonam (AZACTAM) 1 g in dextrose 5 % 50 mL IVPB  Status:  Discontinued     1 g 100 mL/hr over 30 Minutes Intravenous  Once 10/02/17 2354 10/03/17 0039         HPI/Subjective: BP parameters are improving , patient is more awake and alert  Objective: Vitals:   10/05/17 0331 10/05/17 0400 10/05/17 0500 10/05/17 0600  BP:  107/67 129/76 121/80  Pulse:  84 92 93  Resp:  (!) 23 14 13   Temp: 98.3 F (36.8 C)     TempSrc: Axillary     SpO2:      Weight:      Height:        Intake/Output Summary (Last 24 hours) at 10/05/17 0806 Last data filed at 10/05/17 0400  Gross per 24 hour  Intake              655 ml  Output              500 ml  Net              155 ml    Exam:  Examination:  General exam:  Chronically ill-appearing, arousable to voice Respiratory system: Clear to auscultation. Respiratory effort normal. Cardiovascular system: S1 & S2 heard, RRR. No JVD, murmurs, rubs, gallops or clicks. No pedal edema. Gastrointestinal system: Abdomen is nondistended, soft and nontender. No organomegaly or masses felt. Normal bowel sounds heard. Central nervous system: Awake and oriented to self, No focal neurological deficits. Extremities: Symmetric 5 x 5 power. Skin: No rashes, lesions or ulcers      Data Reviewed: I have personally reviewed following labs and imaging studies  Micro Results Recent Results (from the past 240 hour(s))  Urine culture     Status: Abnormal (Preliminary result)   Collection Time: 10/02/17 11:25 PM  Result Value Ref Range Status   Specimen Description URINE, CATHETERIZED  Final   Special Requests NONE  Final   Culture >=100,000 COLONIES/mL ESCHERICHIA COLI (A)  Final   Report Status PENDING  Incomplete  MRSA PCR Screening     Status: None   Collection Time: 10/03/17  3:19 AM  Result Value Ref Range  Status   MRSA by PCR NEGATIVE NEGATIVE Final    Comment:        The GeneXpert MRSA Assay (FDA approved for NASAL specimens only), is one component of a comprehensive MRSA colonization surveillance program. It is not intended to diagnose MRSA infection nor to guide or monitor treatment for MRSA infections.     Radiology Reports Ct Abdomen Pelvis Wo Contrast  Result Date: 10/04/2017 CLINICAL DATA:  Abdominal pain and fever. Evaluate for retroperitoneal hemorrhage or abscess. EXAM: CT ABDOMEN AND PELVIS WITHOUT CONTRAST TECHNIQUE: Multidetector CT imaging of the abdomen and pelvis was performed following the standard protocol without IV contrast. COMPARISON:  Abdominal MRI 02/22/2017 FINDINGS: Lower chest: Asymmetric elevation right hemidiaphragm with bibasilar atelectasis noted. Small left pleural effusion. Hepatobiliary: No focal abnormality in the liver on this study without intravenous contrast. Gallbladder surgically absent. No intrahepatic or extrahepatic biliary dilation. Pancreas: No focal mass lesion. No dilatation of the main duct. No intraparenchymal cyst. No peripancreatic edema. Spleen: No splenomegaly. No focal mass  lesion. Adrenals/Urinary Tract: No adrenal nodule or mass. Right kidney is surgically absent. 2.4 cm exophytic lesion upper pole right kidney has attenuation too high to be a simple cyst but cannot be characterized on this study without intravenous contrast material. Lesion is stable in size comparing to the MRI of the 8 months ago. No left hydroureteronephrosis. The urinary bladder appears normal for the degree of distention. Stomach/Bowel: Gastrostomy tube is evident. Duodenum is normally positioned as is the ligament of Treitz. No small bowel wall thickening. No small bowel dilatation. Terminal ileum not well seen. The appendix is not visualized, but there is no edema or inflammation in the region of the cecum. No gross colonic mass. No colonic wall thickening. No  substantial diverticular change. Vascular/Lymphatic: There is abdominal aortic atherosclerosis without aneurysm. There is no gastrohepatic or hepatoduodenal ligament lymphadenopathy. No intraperitoneal or retroperitoneal lymphadenopathy. No pelvic sidewall lymphadenopathy. Reproductive: The prostate gland and seminal vesicles have normal imaging features. Other: No intraperitoneal free fluid. Musculoskeletal: In the midline, 3-4 cm cranial to the umbilicus, a track of gas and debris is identified in the subcutaneous fat of the anterior abdominal wall. This tracks deep into the rectus sheath (images 56-61 of series 2) and suggests abscess. This does appear to remain confined with in the rectus sheath and there is no adjacent bowel wall thickening or definite findings to suggest that this represents an enterocutaneous fistula. There is no adjacent loop of colon. Bone windows reveal no worrisome lytic or sclerotic osseous lesions. 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 Hemorrhage and edema are identified in the right axillary region and tracking down the right lateral chest wall, likely related to the proximal humerus fracture. IMPRESSION: 1. Tubular track of fluid and debris measuring about 2 cm in diameter tracks from this scan about 3-4 cm cranial to the umbilicus deep into the right paramidline rectus sheath. Anterior abdominal wall abscess could have this appearance. There is no underlying small bowel wall thickening to suggest enterocutaneous fistula. 2. No evidence for retroperitoneal hematoma. 3. Edema/hemorrhage is identified in the right axillary region tracking down the right lateral chest wall, compatible with the acute proximal humerus fracture. Electronically Signed   By: Kennith Center M.D.   On: 10/04/2017 20:16   Dg Chest 2 View  Result Date: 10/02/2017 CLINICAL DATA:  Status post fall out of bed, with right shoulder pain and bruising. Initial encounter. EXAM: CHEST  2 VIEW  COMPARISON:  Chest radiograph performed 01/30/2015 FINDINGS: There is stable elevation of the right hemidiaphragm. The lungs are well-aerated and clear. There is no evidence of focal opacification, pleural effusion or pneumothorax. The cardiomediastinal silhouette is within normal limits. No acute osseous abnormalities are seen. IMPRESSION: Stable elevation of the right hemidiaphragm. Lungs remain grossly clear. No displaced rib fractures identified. Electronically Signed   By: Roanna Raider M.D.   On: 10/02/2017 17:25   Dg Shoulder Right  Result Date: 10/02/2017 CLINICAL DATA:  51 year old male with history of trauma after falling out of bed yesterday evening complaining of pain in the right shoulder. EXAM: RIGHT SHOULDER - 2+ VIEW COMPARISON:  No priors. FINDINGS: Three views of the right shoulder demonstrate an acute displaced likely mildly impacted fracture of the surgical neck of the humerus, with approximately 8 mm of medial displacement of the distal fracture fragment. There may be some mild comminution of the fracture. The humeral head appears rotated in the glenoid fossa, but remains located. Other visualized bones appear intact. IMPRESSION: 1. Acute minimally  displaced, likely impacted, potentially mildly comminuted fracture of the surgical neck of the right humerus, as above. Electronically Signed   By: Trudie Reed M.D.   On: 10/02/2017 17:29   Ct Head Wo Contrast  Result Date: 10/02/2017 CLINICAL DATA:  Patient fell out of bed last evening. Altered mentation and slow verbal communication. EXAM: CT HEAD WITHOUT CONTRAST CT CERVICAL SPINE WITHOUT CONTRAST TECHNIQUE: Multidetector CT imaging of the head and cervical spine was performed following the standard protocol without intravenous contrast. Multiplanar CT image reconstructions of the cervical spine were also generated. COMPARISON:  06/07/2017 FINDINGS: CT HEAD FINDINGS Brain: Mild superficial and central atrophy with chronic appearing  small vessel ischemic disease. No acute intracranial hemorrhage, midline shift or edema. No large vascular territory infarct. No intracranial hemorrhage, intra-axial mass nor extra-axial fluid collection. No midline shift or mass effect. No hydrocephalus. Vascular: No hyperdense vessel or unexpected calcification. Skull: Normal. Negative for fracture or focal lesion. Sinuses/Orbits: Minimal ethmoid and right maxillary sinus mucosal thickening. Clear mastoids. Intact orbits and globes. Other: None CT CERVICAL SPINE FINDINGS Alignment: Slight reversal cervical lordosis attributable to multilevel degenerative disc disease. Intact craniocervical relationship and atlantodental interval. Skull base and vertebrae: Skull base appears intact. Normal C1-C2 relationship. Intact dense. No acute fracture or suspicious osseous lesions. Soft tissues and spinal canal: No prevertebral fluid or swelling. No visible canal hematoma. Disc levels: Multilevel degenerative disc disease mild to moderate at C2-3 and C3-4 and moderate-to-marked from Celsius 4 through C7. No jumped or perched facets. Mild right side foraminal narrowing at C4-5, C5-6 and C6-7 due to uncovertebral joint and endplate degenerative spurring Upper chest: Negative. Other: None IMPRESSION: 1. No acute intracranial abnormality. 2. Stable atrophy with chronic minimal small vessel ischemic disease. 3. Chronic stable cervical spondylosis without acute cervical spine fracture or posttraumatic subluxation. Electronically Signed   By: Tollie Eth M.D.   On: 10/02/2017 17:05   Ct Cervical Spine Wo Contrast  Result Date: 10/02/2017 CLINICAL DATA:  Patient fell out of bed last evening. Altered mentation and slow verbal communication. EXAM: CT HEAD WITHOUT CONTRAST CT CERVICAL SPINE WITHOUT CONTRAST TECHNIQUE: Multidetector CT imaging of the head and cervical spine was performed following the standard protocol without intravenous contrast. Multiplanar CT image  reconstructions of the cervical spine were also generated. COMPARISON:  06/07/2017 FINDINGS: CT HEAD FINDINGS Brain: Mild superficial and central atrophy with chronic appearing small vessel ischemic disease. No acute intracranial hemorrhage, midline shift or edema. No large vascular territory infarct. No intracranial hemorrhage, intra-axial mass nor extra-axial fluid collection. No midline shift or mass effect. No hydrocephalus. Vascular: No hyperdense vessel or unexpected calcification. Skull: Normal. Negative for fracture or focal lesion. Sinuses/Orbits: Minimal ethmoid and right maxillary sinus mucosal thickening. Clear mastoids. Intact orbits and globes. Other: None CT CERVICAL SPINE FINDINGS Alignment: Slight reversal cervical lordosis attributable to multilevel degenerative disc disease. Intact craniocervical relationship and atlantodental interval. Skull base and vertebrae: Skull base appears intact. Normal C1-C2 relationship. Intact dense. No acute fracture or suspicious osseous lesions. Soft tissues and spinal canal: No prevertebral fluid or swelling. No visible canal hematoma. Disc levels: Multilevel degenerative disc disease mild to moderate at C2-3 and C3-4 and moderate-to-marked from Celsius 4 through C7. No jumped or perched facets. Mild right side foraminal narrowing at C4-5, C5-6 and C6-7 due to uncovertebral joint and endplate degenerative spurring Upper chest: Negative. Other: None IMPRESSION: 1. No acute intracranial abnormality. 2. Stable atrophy with chronic minimal small vessel ischemic disease. 3. Chronic stable cervical spondylosis  without acute cervical spine fracture or posttraumatic subluxation. Electronically Signed   By: Tollie Eth M.D.   On: 10/02/2017 17:05   US Renal  Result Date: 10/04/2017 CLINICAL DATA:  Flank pain, urinary tract infection, acute renal insufficiency. History of previous right nephrectomy. EXAM: RENAL / URINARY TRACT ULTRASOUND COMPLETE COMPARISON:  Abdominal  MRI of February 26 FINDINGS: Right Kidney: The right kidney is surgically absent. Left Kidney: Length: 13.3 cm. Echogenicity within normal limits. No mass or hydronephrosis visualized. Bladder: Appears normal for degree of bladder distention. IMPRESSION: Normal appearance of the left kidney and urinary bladder. Previous right nephrectomy. Electronically Signed   By: David  [place] M.D.   On: 10/04/2017 15:31   Dg Chest Port 1 View  Result Date: 10/03/2017 CLINICAL DATA:  51 year old male status post central line placement. EXAM: PORTABLE CHEST 1 VIEW COMPARISON:  Chest x-ray 10/02/2017. FINDINGS: There is a left-sided subclavian central venous catheter with tip terminating in the superior cavoatrial junction. Study is limited by lack of visualization of the left lung base. With this limitation in mind, lung volumes are very low. Elevation of the right hemidiaphragm is unchanged. No acute consolidative airspace disease. No definite pleural effusions (although a small left pleural effusion cannot be excluded secondary to the limitations of today's study). No evidence of pulmonary edema. Heart size is normal. Mediastinal contours are unremarkable. No definite pneumothorax. IMPRESSION: 1. New left subclavian central venous catheter with tip at the superior cavoatrial junction. 2. Persistent low lung volumes. Electronically Signed   By: Trudie Reed M.D.   On: 10/03/2017 13:17   Dg Humerus Right  Result Date: 10/02/2017 CLINICAL DATA:  51 year old male with history of trauma after falling out of bed yesterday evening. Pain in the right shoulder. EXAM: RIGHT HUMERUS - 2+ VIEW COMPARISON:  No priors. FINDINGS: Three views of the right shoulder demonstrate an acute displaced likely mildly impacted fracture of the surgical neck of the humerus, with approximately 8 mm of medial displacement of the distal fracture fragment. There may be some mild comminution of the fracture. The humeral head appears rotated in the  glenoid fossa, but remains located. Distal aspect of the humerus is otherwise intact. IMPRESSION: 1. Acute mildly displaced, likely impacted, potentially mildly comminuted fracture of the surgical neck of the left humerus, as above. Electronically Signed   By: Trudie Reed M.D.   On: 10/02/2017 17:28     CBC  Recent Labs Lab 10/02/17 1622 10/03/17 0627 10/04/17 0435 10/05/17 0448  WBC 12.2* 8.2 4.8 5.5  HGB 11.8* 10.3* 7.4* 8.5*  HCT 35.9* 31.5* 22.3* 25.1*  PLT 118* 122* 80* 82*  MCV 85.7 86.1 86.1 86.9  MCH 28.2 28.1 28.6 29.4  MCHC 32.9 32.7 33.2 33.9  RDW 14.9 15.2 15.2 14.6  LYMPHSABS 2.1  --   --   --   MONOABS 1.5*  --   --   --   EOSABS 0.0  --   --   --   BASOSABS 0.0  --   --   --     Chemistries   Recent Labs Lab 10/02/17 1622 10/03/17 0627 10/03/17 0854 10/03/17 1700 10/04/17 0435 10/04/17 1958 10/05/17 0448  NA 139 141  --   --  142  --  139  K 4.6 4.3  --   --  3.7  --  4.1  CL 103 106  --   --  109  --  107  CO2 25 24  --   --  26  --  25  GLUCOSE 94 85  --   --  87  --  94  BUN 17 18  --   --  18  --  12  CREATININE 1.53* 1.33*  --   --  1.01  --  0.71  CALCIUM 8.9 8.6*  --   --  7.8*  --  8.0*  MG  --   --  1.6* 1.6* 1.7 2.1  --   AST 20  --   --   --  13*  --  12*  ALT 7*  --   --   --  8*  --  7*  ALKPHOS 62  --   --   --  39  --  42  BILITOT 0.7  --   --   --  0.7  --  0.7   ------------------------------------------------------------------------------------------------------------------ estimated creatinine clearance is 141.7 mL/min (by C-G formula based on SCr of 0.71 mg/dL). ------------------------------------------------------------------------------------------------------------------ No results for input(s): HGBA1C in the last 72 hours. ------------------------------------------------------------------------------------------------------------------ No results for input(s): CHOL, HDL, LDLCALC, TRIG, CHOLHDL, LDLDIRECT in the last  72 hours. ------------------------------------------------------------------------------------------------------------------ No results for input(s): TSH, T4TOTAL, T3FREE, THYROIDAB in the last 72 hours.  Invalid input(s): FREET3 ------------------------------------------------------------------------------------------------------------------  Recent Labs  10/04/17 1257 10/04/17 1330  VITAMINB12  --  272  FOLATE  --  7.9  FERRITIN  --  176  TIBC  --  160*  IRON  --  18*  RETICCTPCT 2.7  --     Coagulation profile  Recent Labs Lab 10/02/17 1622  INR 1.04    No results for input(s): DDIMER in the last 72 hours.  Cardiac Enzymes  Recent Labs Lab 10/04/17 1257 10/04/17 1958 10/05/17 0448  TROPONINI <0.03 <0.03 <0.03   ------------------------------------------------------------------------------------------------------------------ Invalid input(s): POCBNP   CBG:  Recent Labs Lab 10/04/17 1646 10/04/17 1933 10/04/17 2307 10/05/17 0330 10/05/17 0731  GLUCAP 94 106* 114* 96 90       Studies: Ct Abdomen Pelvis Wo Contrast  Result Date: 10/04/2017 CLINICAL DATA:  Abdominal pain and fever. Evaluate for retroperitoneal hemorrhage or abscess. EXAM: CT ABDOMEN AND PELVIS WITHOUT CONTRAST TECHNIQUE: Multidetector CT imaging of the abdomen and pelvis was performed following the standard protocol without IV contrast. COMPARISON:  Abdominal MRI 02/22/2017 FINDINGS: Lower chest: Asymmetric elevation right hemidiaphragm with bibasilar atelectasis noted. Small left pleural effusion. Hepatobiliary: No focal abnormality in the liver on this study without intravenous contrast. Gallbladder surgically absent. No intrahepatic or extrahepatic biliary dilation. Pancreas: No focal mass lesion. No dilatation of the main duct. No intraparenchymal cyst. No peripancreatic edema. Spleen: No splenomegaly. No focal mass lesion. Adrenals/Urinary Tract: No adrenal nodule or mass. Right kidney  is surgically absent. 2.4 cm exophytic lesion upper pole right kidney has attenuation too high to be a simple cyst but cannot be characterized on this study without intravenous contrast material. Lesion is stable in size comparing to the MRI of the 8 months ago. No left hydroureteronephrosis. The urinary bladder appears normal for the degree of distention. Stomach/Bowel: Gastrostomy tube is evident. Duodenum is normally positioned as is the ligament of Treitz. No small bowel wall thickening. No small bowel dilatation. Terminal ileum not well seen. The appendix is not visualized, but there is no edema or inflammation in the region of the cecum. No gross colonic mass. No colonic wall thickening. No substantial diverticular change. Vascular/Lymphatic: There is abdominal aortic atherosclerosis without aneurysm. There is no gastrohepatic or hepatoduodenal ligament lymphadenopathy. No intraperitoneal or retroperitoneal lymphadenopathy.  No pelvic sidewall lymphadenopathy. Reproductive: The prostate gland and seminal vesicles have normal imaging features. Other: No intraperitoneal free fluid. Musculoskeletal: In the midline, 3-4 cm cranial to the umbilicus, a track of gas and debris is identified in the subcutaneous fat of the anterior abdominal wall. This tracks deep into the rectus sheath (images 56-61 of series 2) and suggests abscess. This does appear to remain confined with in the rectus sheath and there is no adjacent bowel wall thickening or definite findings to suggest that this represents an enterocutaneous fistula. There is no adjacent loop of colon. Bone windows reveal no worrisome lytic or sclerotic osseous lesions. 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 Hemorrhage and edema are identified in the right axillary region and tracking down the right lateral chest wall, likely related to the proximal humerus fracture. IMPRESSION: 1. Tubular track of fluid and debris measuring about 2 cm in diameter  tracks from this scan about 3-4 cm cranial to the umbilicus deep into the right paramidline rectus sheath. Anterior abdominal wall abscess could have this appearance. There is no underlying small bowel wall thickening to suggest enterocutaneous fistula. 2. No evidence for retroperitoneal hematoma. 3. Edema/hemorrhage is identified in the right axillary region tracking down the right lateral chest wall, compatible with the acute proximal humerus fracture. Electronically Signed   By: Kennith Center M.D.   On: 10/04/2017 20:16   US Renal  Result Date: 10/04/2017 CLINICAL DATA:  Flank pain, urinary tract infection, acute renal insufficiency. History of previous right nephrectomy. EXAM: RENAL / URINARY TRACT ULTRASOUND COMPLETE COMPARISON:  Abdominal MRI of February 26 FINDINGS: Right Kidney: The right kidney is surgically absent. Left Kidney: Length: 13.3 cm. Echogenicity within normal limits. No mass or hydronephrosis visualized. Bladder: Appears normal for degree of bladder distention. IMPRESSION: Normal appearance of the left kidney and urinary bladder. Previous right nephrectomy. Electronically Signed   By: David  [place] M.D.   On: 10/04/2017 15:31   Dg Chest Port 1 View  Result Date: 10/03/2017 CLINICAL DATA:  51 year old male status post central line placement. EXAM: PORTABLE CHEST 1 VIEW COMPARISON:  Chest x-ray 10/02/2017. FINDINGS: There is a left-sided subclavian central venous catheter with tip terminating in the superior cavoatrial junction. Study is limited by lack of visualization of the left lung base. With this limitation in mind, lung volumes are very low. Elevation of the right hemidiaphragm is unchanged. No acute consolidative airspace disease. No definite pleural effusions (although a small left pleural effusion cannot be excluded secondary to the limitations of today's study). No evidence of pulmonary edema. Heart size is normal. Mediastinal contours are unremarkable. No definite  pneumothorax. IMPRESSION: 1. New left subclavian central venous catheter with tip at the superior cavoatrial junction. 2. Persistent low lung volumes. Electronically Signed   By: Trudie Reed M.D.   On: 10/03/2017 13:17      Lab Results  Component Value Date   HGBA1C 4.6 11/24/2011   HGBA1C 5.0 10/05/2011   HGBA1C (L) 06/19/2010    4.3 (NOTE)                                                                       According to the ADA Clinical Practice Recommendations for 2011, when HbA1c is  used as a screening test:   >=6.5%   Diagnostic of Diabetes Mellitus           (if abnormal result  is confirmed)  5.7-6.4%   Increased risk of developing Diabetes Mellitus  References:Diagnosis and Classification of Diabetes Mellitus,Diabetes Care,2011,34(Suppl 1):S62-S69 and Standards of Medical Care in         Diabetes - 2011,Diabetes Care,2011,34  (Suppl 1):S11-S61.   Lab Results  Component Value Date   CREATININE 0.71 10/05/2017       Scheduled Meds: . acetaminophen  650 mg Oral Once  . Chlorhexidine Gluconate Cloth  6 each Topical Q0600  . divalproex  500 mg Oral TID  . feeding supplement (VITAL AF 1.2 CAL)  237 mL Per Tube QID  . free water  240 mL Per Tube Q6H  . hydrocortisone sod succinate (SOLU-CORTEF) inj  50 mg Intravenous Q8H  . Influenza vac split quadrivalent PF  0.5 mL Intramuscular Tomorrow-1000  . levothyroxine  200 mcg Oral QAC breakfast  . multivitamin  15 mL Per Tube Q1200  . pantoprazole (PROTONIX) IV  40 mg Intravenous Q12H  . PARoxetine  20 mg Oral Daily  . polyethylene glycol  17 g Per Tube BID  . polyvinyl alcohol  1 drop Both Eyes QID  . risperiDONE  2 mg Sublingual BID  . sennosides  15 mL Oral BID  . traZODone  50 mg Oral BID   Continuous Infusions: . sodium chloride 100 mL/hr at 10/04/17 2027  . sodium chloride    . diltiazem (CARDIZEM) infusion Stopped (10/04/17 0602)  . meropenem (MERREM) IV Stopped (10/05/17 0133)     LOS: 2 days    Time spent:  >30 MINS    Richarda Overlie  [facility] Pager [phone]. If 7PM-7AM, please contact night-coverage at [email or web address], password [facility] 10/05/2017, 8:06 AM  LOS: 2 days

## 2017-10-05 NOTE — Evaluation (Signed)
Clinical/Bedside Swallow Evaluation Patient Details  Name: Ryley Bachtel MRN: [id number] Date of Birth: February 28, 1966  Today's Date: 10/05/2017 Time: SLP Start Time (ACUTE ONLY): 1515 SLP Stop Time (ACUTE ONLY): 1550 SLP Time Calculation (min) (ACUTE ONLY): 35 min  Past Medical History:  Past Medical History:  Diagnosis Date  . A-fib (HCC)   . Addison disease (HCC)   . Addison disease (HCC)   . Anoxic brain injury (HCC)   . Anxiety   . Aspiration pneumonia (HCC)   . Atrial fibrillation (HCC)   . Chronic constipation 11/23/2011  . Dysphagia   . Dysphagia   . Encephalopathy   . Glaucoma   . Glucocorticoid deficiency (HCC)   . History of recurrent UTIs   . Hyperlipemia   . Hypothyroidism   . Mental disorder   . Myocardial infarction (HCC)   . Pneumoperitoneum of unknown etiology 12/21/2011  . Quadriplegia (HCC)   . Reflux   . Seizure disorder [facility])    Past Surgical History:  Past Surgical History:  Procedure Laterality Date  . IR GENERIC HISTORICAL  10/20/2016   IR REPLC GASTRO/COLONIC TUBE PERCUT W/FLUORO 10/20/2016 Oley Balm, MD [facility]-INTERV RAD  . IR REPLC GASTRO/COLONIC TUBE PERCUT W/FLUORO  04/21/2017  . IR REPLC GASTRO/COLONIC TUBE PERCUT W/FLUORO  07/22/2017  . NEPHRECTOMY  unknown  . PEG TUBE PLACEMENT     HPI:  51 yo male adm to [facility] after fall at SNF with resultant right humerus fx - PMH + for anoxic brain injury, seizures, feeding tube placement, Afib, Addison's disease, anxiety, aspiration pna, recurrent UTIs, mental d/o, quadriparesis, reflux.  Per RN, pt coughing with intake today and swallow evaluation ordered.  Most recent MBS April 2018 showed aspiration of thin and nectar with cough response.  No aspiration of honey thick and soft diet recommended.     Assessment / Plan / Recommendation Clinical Impression  Patient with known h/o oropharyngeal dysphagia due to anoxic BI with known aspiration of thin, nectar.  Today patient presents with no overt indication  of aspiration with ice, nectar, honey via cup/straw nor pudding.   Suspect mild oral transiting delay with increased viscocity - eg pudding.  Pt does appear with episodic apnea - which may increase aspiration risk.  He did have a single cough when cued to inhale through nose due to decreased oxygen saturation - suspect possible secretion aspiration.    Recommend full liquids/honey thick liquid and allow ice chips with strict precautions.  Hopeful for pt to be able to advance diet clinically as improves.  Pt's weak cough and decreased breath support increases aspiration pna risk.  RN informed and agreeable to plan.   SLP Visit Diagnosis: Dysphagia, oropharyngeal phase (R13.12)    Aspiration Risk  Moderate aspiration risk    Diet Recommendation Honey-thick liquid (ice chips, full liquids)   Liquid Administration via: Cup Medication Administration: Via alternative means Supervision: Full supervision/cueing for compensatory strategies Compensations: Minimize environmental distractions;Slow rate;Small sips/bites Postural Changes: Remain upright for at least 30 minutes after po intake;Seated upright at 90 degrees    Other  Recommendations Oral Care Recommendations: Oral care BID   Follow up Recommendations        Frequency and Duration min 2x/week  2 weeks       Prognosis Prognosis for Safe Diet Advancement: Fair      Swallow Study   General Date of Onset: 10/05/17 HPI: 51 yo male adm to [facility] after fall at SNF with resultant right humerus fx - PMH +  for anoxic brain injury, seizures, feeding tube placement, Afib, Addison's disease, anxiety, aspiration pna, recurrent UTIs, mental d/o, quadriparesis, reflux.  Per RN, pt coughing with intake today and swallow evaluation ordered.  Most recent MBS April 2018 showed aspiration of thin and nectar with cough response.  No aspiration of honey thick and soft diet recommended.   Type of Study: Bedside Swallow Evaluation Diet Prior to this Study:  Regular;Honey-thick liquids Temperature Spikes Noted: No Respiratory Status: Nasal cannula (2 liters) History of Recent Intubation: No Behavior/Cognition: Alert;Doesn't follow directions (decreased sustained attention) Oral Cavity Assessment: Within Functional Limits Oral Care Completed by SLP: No Oral Cavity - Dentition: Adequate natural dentition Self-Feeding Abilities: Needs assist Patient Positioning: Upright in bed Baseline Vocal Quality: Suspected CN X (Vagus) involvement (decreased phonatory strength and weak volitional cough) Volitional Cough: Weak Volitional Swallow: Able to elicit    Oral/Motor/Sensory Function Overall Oral Motor/Sensory Function: Generalized oral weakness (pt dysarthric and with decreased breath support)   Ice Chips Ice chips: Within functional limits Presentation: Spoon   Thin Liquid Thin Liquid: Not tested    Nectar Thick Nectar Thick Liquid: Within functional limits Presentation: Cup;Self Fed   Honey Thick Honey Thick Liquid: Within functional limits Presentation: Straw;Cup   Puree Puree: Within functional limits Presentation: Spoon   Solid   GO   Solid: Not tested Other Comments: due to pt's report of more problems with solids and apnea observed        Chales Abrahams 10/05/2017,4:12 PM Donavan Burnet, MS [facility] SLP [phone]

## 2017-10-05 NOTE — Progress Notes (Signed)
  Echocardiogram 2D Echocardiogram has been performed.  Monie Shere T Balin Vandegrift 10/05/2017, 9:57 AM

## 2017-10-05 NOTE — Clinical Social Work Note (Addendum)
Clinical Social Work Assessment  Patient Details  Name: [name] MRN: [id number] Date of Birth: [birth year]  Date of referral:  10/05/17               Reason for consult:  Facility Placement                Permission sought to share information with:  Family Supports Permission granted to share information::  Yes, Verbal Permission Granted  Name::      Jaquay Posthumus   Agency::  [employer].  Relationship::  Mother   Contact Information:   [phone]  Housing/Transportation Living arrangements for the past 2 months:  Skilled Nursing Facility Source of Information:  Parent Patient Interpreter Needed:  None Criminal Activity/Legal Involvement Pertinent to Current Situation/Hospitalization:  No - Comment as needed Significant Relationships:  Parents Lives with:  Facility Resident Do you feel safe going back to the place where you live?  Yes Need for family participation in patient care:  Yes   Care giving concerns:  Patient w/ past medical history of Traumatic Brain Injury from SNF. Patient admitted for fall. He reports right shoulder pain. Patient found to have UTI.  Patient oriented to self and walks unassisted at the facility. The patient needs help with his Activities of daily living.     Social Worker assessment / plan:  CSW spoke with patient mother via phone. She is two hours away in [place]. Patient mother reports the patient is a Long term care resident at [facility]. She reports the  plan at discharge is for the patient to return to the facility.  She reports she is able to gather information about pt. care from the nurse.. CSW spoke with patient nurse- Mon at facility, she report the patient is total assist with all ADL'S. He attempts to walk, but uses a wheel chair.   Plan: Assist with discharge back to [facility] once medically stable.   Employment status:  Disabled (Comment on whether or not currently receiving  Disability) Insurance information:  Medicare PT Recommendations:  Skilled Nursing Facility Information / Referral to community resources:  Skilled Nursing Facility  Patient/Family's Response to care:  Agreeable and Responding to care.   Patient/Family's Understanding of and Emotional Response to Diagnosis, Current Treatment, and Prognosis: "They found out that Mabel had additional medical problems so they are medicating him for that." Patient mother reports she calls nurse about three times a day to understand patient care or just to check on him.  Emotional Assessment Appearance:    Attitude/Demeanor/Rapport:    Affect (typically observed):  Calm Orientation:  Oriented to Self Alcohol / Substance use:  Not Applicable Psych involvement (Current and /or in the community):  No (Comment)  Discharge Needs  Concerns to be addressed:  Discharge Planning Concerns, Care Coordination Readmission within the last 30 days:  No Current discharge risk:  None Barriers to Discharge:  Continued Medical Work up   [employer], LCSW 10/05/2017, 3:10 PM

## 2017-10-05 NOTE — Consult Note (Signed)
[facility] Surgery Consult/Admission Note  [name] 1966-10-24  [id number].    Requesting MD: Dr. Allyson Sabal Chief Complaint/Reason for Consult: possible abscess of abdomen   HPI:   Pt is a 51 year old male with a history of TBI, seizures, anoxic brain injury, atrial fibrillation currently normal sinus rhythm, hypothyroidism, cardiomyopathy, Addison's disease, PEG tube, and hyperlipidemia who presented to the ED after sustaining a fall. At baseline patient resides in SNF, is oriented to self, walks unassisted and curses at everyone according to prior notes. He was complaining of right arm pain. Pt was found to have a right humeral neck fracture and UTI. Pt is complaining of right sided abdominal pain and right arm pain. He states the abdominal pain onset 1-2 days before admission, mild and nonradiating. Pt has not other complaints. He has no nausea or vomiting. Ct scan showed tubular track of fluid and debris measuring ~2cm in diameter tracks from this scan about 3-4 cm cranial to the umbilicus deep into the right paramidline rectus sheath. Pt denies pain in this area. He denies fevers.   ROS:  Review of Systems  Constitutional: Negative for fever.  Respiratory: Negative for shortness of breath.   Cardiovascular: Negative for chest pain.  Gastrointestinal: Positive for abdominal pain. Negative for nausea and vomiting.  Musculoskeletal: Positive for falls and myalgias (right upper arm pain).  Neurological: Negative for dizziness and loss of consciousness.  All other systems reviewed and are negative.    History reviewed. No pertinent family history.  Past Medical History:  Diagnosis Date  . A-fib ([place])   . Addison disease ([place])   . Addison disease ([place])   . Anoxic brain injury ([place])   . Anxiety   . Aspiration pneumonia ([place])   . Atrial fibrillation ([place])   . Chronic constipation 11/23/2011  . Dysphagia   . Dysphagia   . Encephalopathy   . Glaucoma   . Glucocorticoid  deficiency ([place])   . History of recurrent UTIs   . Hyperlipemia   . Hypothyroidism   . Mental disorder   . Myocardial infarction ([place])   . Pneumoperitoneum of unknown etiology 12/21/2011  . Quadriplegia ([place])   . Reflux   . Seizure disorder [facility])     Past Surgical History:  Procedure Laterality Date  . IR GENERIC HISTORICAL  10/20/2016   IR [place] TUBE PERCUT W/FLUORO 10/20/2016 Arne Cleveland, MD [facility]-INTERV RAD  . IR [place] TUBE PERCUT W/FLUORO  04/21/2017  . IR [facility] GASTRO/COLONIC TUBE PERCUT W/FLUORO  07/22/2017  . NEPHRECTOMY  unknown  . PEG TUBE PLACEMENT      Social History:  reports that he has never smoked. He has never used smokeless tobacco. He reports that he does not drink alcohol or use drugs.  Allergies:  Allergies  Allergen Reactions  . Ativan [Lorazepam] Other (See Comments)    Unknown (not listed on physician's orders from [facility] 07/05/15)  . Latex Dermatitis  . Morphine And Related Other (See Comments)    Listed on physician's orders from [facility] 07/05/15  . Penicillins Other (See Comments)    Listed on physician's orders from [facility] 07/05/15 Has patient had a PCN reaction causing immediate rash, facial/tongue/throat swelling, SOB or lightheadedness with hypotension: unknown Has patient had a PCN reaction causing severe rash involving mucus membranes or skin necrosis: unknown Has patient had a PCN reaction that required hospitalization: no Has patient had a PCN reaction occurring within the last 10 years: yes If all of  the above answers are "NO", then may proceed with Cephalosporin [place]  . Pyridium [Phenazopyridine Hcl] Other (See Comments)    Listed on physician's orders from [facility] 07/05/15  . Soy Allergy Other (See Comments)    Listed on physician's orders from [facility] 07/05/15    Medications Prior to Admission  Medication Sig Dispense Refill  . acetaminophen (TYLENOL) 325 MG tablet Place 650 mg into feeding tube  every 4 (four) hours as needed for mild pain.    . divalproex (DEPAKOTE SPRINKLE) 125 MG capsule Take 500 mg by mouth 3 (three) times daily.     Marland Kitchen levothyroxine (SYNTHROID, LEVOTHROID) 200 MCG tablet Take 200 mcg by mouth daily before breakfast.    . loratadine (CLARITIN) 10 MG tablet 10 mg by PEG Tube route daily.     Marland Kitchen METOPROLOL SUCCINATE ER PO 12.5 mg by PEG Tube route 2 (two) times daily. Metoprolol tartrate 6.25 mg/5 ml suspension    . Multiple Vitamins-Minerals (CERTAVITE/ANTIOXIDANTS) LIQD 15 mLs by PEG Tube route daily at 12 noon.     . Nutritional Supplements (VITAL 1.5 CAL PO) Take 237 mLs by mouth as needed (For less than 50% food consumption.).    Marland Kitchen pantoprazole sodium (PROTONIX) 40 mg/20 mL PACK 40 mg by PEG Tube route daily at 12 noon.     Marland Kitchen PARoxetine (PAXIL) 20 MG tablet Take 20 mg by mouth daily.    . polyethylene glycol (MIRALAX / GLYCOLAX) packet 17 g by PEG Tube route 2 (two) times daily. Mix 17gm  In 4-8 ounces of liquid    . polyvinyl alcohol (LIQUIFILM TEARS) 1.4 % ophthalmic solution Place 1 drop into both eyes 4 (four) times daily. 9am, 2pm, 6pm, 9pm    . risperiDONE (RISPERDAL M-TABS) 2 MG disintegrating tablet Place 2 mg under the tongue 2 (two) times daily.    . sennosides (SENOKOT) 8.8 MG/5ML syrup Take 15 mLs by mouth 2 (two) times daily.    . traZODone (DESYREL) 50 MG tablet Take 50 mg by mouth 2 (two) times daily.       Blood pressure (!) 99/54, pulse 77, temperature 98.1 F (36.7 C), temperature source Oral, resp. rate (!) 21, height '5\' 11"'  (1.803 m), weight 245 lb 2.4 oz (111.2 kg), SpO2 99 %.  Physical Exam  Constitutional: He is well-developed, well-nourished, and in no distress. No distress.  HENT:  Head: Normocephalic.  Nose: Nose normal.  Mouth/Throat: Oropharynx is clear and moist. No oropharyngeal exudate.  Eyes: Conjunctivae are normal. Right eye exhibits no discharge. Left eye exhibits no discharge. No scleral icterus.  Pupils are equal  Neck:  Normal range of motion. Neck supple. No tracheal deviation present. No thyromegaly present.  Cardiovascular: Normal rate and regular rhythm.   No murmur heard. Pulses:      Dorsalis pedis pulses are 2+ on the right side, and 2+ on the left side.  Pulmonary/Chest: Effort normal and breath sounds normal. He has no wheezes. He has no rhonchi. He has no rales.  Abdominal: Soft. Bowel sounds are normal. He exhibits no distension. There is no hepatosplenomegaly. There is no tenderness. There is no rigidity, no guarding and negative Murphy's sign.    Open wound roughly 2-3 cm superior to umbilicus, midline, with scant purulent drainage, no surrounding erythema or edema  G tube in place  Musculoskeletal: He exhibits edema (1+ of BLE and of upper RE with echymosis ). He exhibits no deformity.  Neurological: He is alert.  Skin: Skin is warm  and dry. He is not diaphoretic.  Nursing note and vitals reviewed.   Results for orders placed or performed during the hospital encounter of 10/02/17 (from the past 48 hour(s))  Glucose, capillary     Status: Abnormal   Collection Time: 10/03/17  3:21 PM  Result Value Ref Range   Glucose-Capillary 108 (H) 65 - 99 mg/dL  Magnesium     Status: Abnormal   Collection Time: 10/03/17  5:00 PM  Result Value Ref Range   Magnesium 1.6 (L) 1.7 - 2.4 mg/dL  Phosphorus     Status: None   Collection Time: 10/03/17  5:00 PM  Result Value Ref Range   Phosphorus 3.2 2.5 - 4.6 mg/dL  Glucose, capillary     Status: None   Collection Time: 10/03/17  7:31 PM  Result Value Ref Range   Glucose-Capillary 99 65 - 99 mg/dL   Comment 1 Notify RN    Comment 2 Document in Chart   Glucose, capillary     Status: Abnormal   Collection Time: 10/03/17 11:24 PM  Result Value Ref Range   Glucose-Capillary 109 (H) 65 - 99 mg/dL  Glucose, capillary     Status: None   Collection Time: 10/04/17  3:34 AM  Result Value Ref Range   Glucose-Capillary 92 65 - 99 mg/dL  CBC     Status:  Abnormal   Collection Time: 10/04/17  4:35 AM  Result Value Ref Range   WBC 4.8 4.0 - 10.5 K/uL   RBC 2.59 (L) 4.22 - 5.81 MIL/uL   Hemoglobin 7.4 (L) 13.0 - 17.0 g/dL    Comment: RESULT REPEATED AND VERIFIED DELTA CHECK NOTED    HCT 22.3 (L) 39.0 - 52.0 %   MCV 86.1 78.0 - 100.0 fL   MCH 28.6 26.0 - 34.0 pg   MCHC 33.2 30.0 - 36.0 g/dL   RDW 15.2 11.5 - 15.5 %   Platelets 80 (L) 150 - 400 K/uL    Comment: CONSISTENT WITH PREVIOUS RESULT  Comprehensive metabolic panel     Status: Abnormal   Collection Time: 10/04/17  4:35 AM  Result Value Ref Range   Sodium 142 135 - 145 mmol/L   Potassium 3.7 3.5 - 5.1 mmol/L   Chloride 109 101 - 111 mmol/L   CO2 26 22 - 32 mmol/L   Glucose, Bld 87 65 - 99 mg/dL   BUN 18 6 - 20 mg/dL   Creatinine, Ser 1.01 0.61 - 1.24 mg/dL   Calcium 7.8 (L) 8.9 - 10.3 mg/dL   Total Protein 5.2 (L) 6.5 - 8.1 g/dL   Albumin 2.6 (L) 3.5 - 5.0 g/dL   AST 13 (L) 15 - 41 U/L   ALT 8 (L) 17 - 63 U/L   Alkaline Phosphatase 39 38 - 126 U/L   Total Bilirubin 0.7 0.3 - 1.2 mg/dL   GFR calc non Af Amer >60 >60 mL/min   GFR calc Af Amer >60 >60 mL/min    Comment: (NOTE) The eGFR has been calculated using the CKD EPI equation. This calculation has not been validated in all clinical situations. eGFR's persistently <60 mL/min signify possible Chronic Kidney Disease.    Anion gap 7 5 - 15  Magnesium     Status: None   Collection Time: 10/04/17  4:35 AM  Result Value Ref Range   Magnesium 1.7 1.7 - 2.4 mg/dL  Phosphorus     Status: None   Collection Time: 10/04/17  4:35 AM  Result Value Ref  Range   Phosphorus 3.3 2.5 - 4.6 mg/dL  Glucose, capillary     Status: None   Collection Time: 10/04/17  7:30 AM  Result Value Ref Range   Glucose-Capillary 82 65 - 99 mg/dL  Glucose, capillary     Status: None   Collection Time: 10/04/17 11:39 AM  Result Value Ref Range   Glucose-Capillary 99 65 - 99 mg/dL  Reticulocytes     Status: Abnormal   Collection Time: 10/04/17  12:57 PM  Result Value Ref Range   Retic Ct Pct 2.7 0.4 - 3.1 %   RBC. 2.62 (L) 4.22 - 5.81 MIL/uL   Retic Count, Absolute 70.7 19.0 - 186.0 K/uL  Troponin I     Status: None   Collection Time: 10/04/17 12:57 PM  Result Value Ref Range   Troponin I <0.03 <0.03 ng/mL  Vitamin B12     Status: None   Collection Time: 10/04/17  1:30 PM  Result Value Ref Range   Vitamin B-12 272 180 - 914 pg/mL    Comment: (NOTE) This assay is not validated for testing neonatal or myeloproliferative syndrome specimens for Vitamin B12 levels. Performed at [facility], [place] [address] [place], [place] [address]   Folate     Status: None   Collection Time: 10/04/17  1:30 PM  Result Value Ref Range   Folate 7.9 >5.9 ng/mL    Comment: Performed at [facility] [address] [place], [place] [address]  Iron and TIBC     Status: Abnormal   Collection Time: 10/04/17  1:30 PM  Result Value Ref Range   Iron 18 (L) 45 - 182 ug/dL   TIBC 160 (L) 250 - 450 ug/dL   Saturation Ratios 11 (L) 17.9 - 39.5 %   UIBC 142 ug/dL    Comment: Performed at [facility] [address] [place], [place] [address]  Ferritin     Status: None   Collection Time: 10/04/17  1:30 PM  Result Value Ref Range   Ferritin 176 24 - 336 ng/mL    Comment: Performed at [facility] [address], [place], [place] [address]  Blood gas, arterial     Status: None   Collection Time: 10/04/17  2:07 PM  Result Value Ref Range   O2 Content 2.0 L/min   Delivery systems NASAL CANNULA    pH, Arterial 7.418 7.350 - 7.450   pCO2 arterial 40.0 32.0 - 48.0 mmHg   pO2, Arterial 103 83.0 - 108.0 mmHg   Bicarbonate 25.3 20.0 - 28.0 mmol/L   Acid-Base Excess 1.3 0.0 - 2.0 mmol/L   O2 Saturation 98.1 %   Patient temperature 37.0    Collection site LEFT RADIAL    Drawn by [phone]    Sample type ARTERIAL DRAW    Allens test (pass/fail) PASS PASS  Type and screen [place]     Status: None  (Preliminary result)   Collection Time: 10/04/17  2:30 PM  Result Value Ref Range   ABO/RH(D) O POS    Antibody Screen POS    Sample Expiration 10/07/2017    DAT, IgG NEG    Antibody Identification ANTI E ANTI FYA (Duffy a)    Unit Number [id number]    Blood Component Type RED CELLS,LR    Unit division 00    Status of Unit ISSUED,FINAL    Transfusion Status OK TO TRANSFUSE    Crossmatch Result COMPATIBLE  Donor AG Type      NEGATIVE FOR E ANTIGEN NEGATIVE FOR DUFFY A ANTIGEN   Unit Number [id number]    Blood Component Type RED CELLS,LR    Unit division 00    Status of Unit ALLOCATED    Transfusion Status OK TO TRANSFUSE    Crossmatch Result COMPATIBLE    Donor AG Type      NEGATIVE FOR DUFFY A ANTIGEN NEGATIVE FOR E ANTIGEN  Prepare RBC     Status: None   Collection Time: 10/04/17  2:30 PM  Result Value Ref Range   Order Confirmation ORDER PROCESSED BY BLOOD BANK   Procalcitonin - Baseline     Status: None   Collection Time: 10/04/17  2:47 PM  Result Value Ref Range   Procalcitonin <0.10 ng/mL    Comment:        Interpretation: PCT (Procalcitonin) <= 0.5 ng/mL: Systemic infection (sepsis) is not likely. Local bacterial infection is possible. (NOTE)         ICU PCT Algorithm               Non ICU PCT Algorithm    ----------------------------     ------------------------------         PCT < 0.25 ng/mL                 PCT < 0.1 ng/mL     Stopping of antibiotics            Stopping of antibiotics       strongly encouraged.               strongly encouraged.    ----------------------------     ------------------------------       PCT level decrease by               PCT < 0.25 ng/mL       >= 80% from peak PCT       OR PCT 0.25 - 0.5 ng/mL          Stopping of antibiotics                                             encouraged.     Stopping of antibiotics           encouraged.    ----------------------------     ------------------------------       PCT level  decrease by              PCT >= 0.25 ng/mL       < 80% from peak PCT        AND PCT >= 0.5 ng/mL            Continuin g antibiotics                                              encouraged.       Continuing antibiotics            encouraged.    ----------------------------     ------------------------------     PCT level increase compared          PCT > 0.5 ng/mL         with peak PCT AND  PCT >= 0.5 ng/mL             Escalation of antibiotics                                          strongly encouraged.      Escalation of antibiotics        strongly encouraged.   Glucose, capillary     Status: None   Collection Time: 10/04/17  4:46 PM  Result Value Ref Range   Glucose-Capillary 94 65 - 99 mg/dL  Glucose, capillary     Status: Abnormal   Collection Time: 10/04/17  7:33 PM  Result Value Ref Range   Glucose-Capillary 106 (H) 65 - 99 mg/dL   Comment 1 Notify RN    Comment 2 Document in Chart   Magnesium     Status: None   Collection Time: 10/04/17  7:58 PM  Result Value Ref Range   Magnesium 2.1 1.7 - 2.4 mg/dL  Phosphorus     Status: None   Collection Time: 10/04/17  7:58 PM  Result Value Ref Range   Phosphorus 3.6 2.5 - 4.6 mg/dL  Troponin I     Status: None   Collection Time: 10/04/17  7:58 PM  Result Value Ref Range   Troponin I <0.03 <0.03 ng/mL  Glucose, capillary     Status: Abnormal   Collection Time: 10/04/17 11:07 PM  Result Value Ref Range   Glucose-Capillary 114 (H) 65 - 99 mg/dL   Comment 1 Notify RN    Comment 2 Document in Chart   Occult blood card to lab, stool     Status: None   Collection Time: 10/05/17  3:02 AM  Result Value Ref Range   Fecal Occult Bld NEGATIVE NEGATIVE  Glucose, capillary     Status: None   Collection Time: 10/05/17  3:30 AM  Result Value Ref Range   Glucose-Capillary 96 65 - 99 mg/dL   Comment 1 Notify RN    Comment 2 Document in Chart   Troponin I     Status: None   Collection Time: 10/05/17  4:48 AM  Result Value Ref  Range   Troponin I <0.03 <0.03 ng/mL  CBC     Status: Abnormal   Collection Time: 10/05/17  4:48 AM  Result Value Ref Range   WBC 5.5 4.0 - 10.5 K/uL   RBC 2.89 (L) 4.22 - 5.81 MIL/uL   Hemoglobin 8.5 (L) 13.0 - 17.0 g/dL   HCT 25.1 (L) 39.0 - 52.0 %   MCV 86.9 78.0 - 100.0 fL   MCH 29.4 26.0 - 34.0 pg   MCHC 33.9 30.0 - 36.0 g/dL   RDW 14.6 11.5 - 15.5 %   Platelets 82 (L) 150 - 400 K/uL    Comment: CONSISTENT WITH PREVIOUS RESULT  Comprehensive metabolic panel     Status: Abnormal   Collection Time: 10/05/17  4:48 AM  Result Value Ref Range   Sodium 139 135 - 145 mmol/L   Potassium 4.1 3.5 - 5.1 mmol/L   Chloride 107 101 - 111 mmol/L   CO2 25 22 - 32 mmol/L   Glucose, Bld 94 65 - 99 mg/dL   BUN 12 6 - 20 mg/dL   Creatinine, Ser 0.71 0.61 - 1.24 mg/dL   Calcium 8.0 (L) 8.9 - 10.3 mg/dL   Total Protein 5.5 (L) 6.5 - 8.1  g/dL   Albumin 2.5 (L) 3.5 - 5.0 g/dL   AST 12 (L) 15 - 41 U/L   ALT 7 (L) 17 - 63 U/L   Alkaline Phosphatase 42 38 - 126 U/L   Total Bilirubin 0.7 0.3 - 1.2 mg/dL   GFR calc non Af Amer >60 >60 mL/min   GFR calc Af Amer >60 >60 mL/min    Comment: (NOTE) The eGFR has been calculated using the CKD EPI equation. This calculation has not been validated in all clinical situations. eGFR's persistently <60 mL/min signify possible Chronic Kidney Disease.    Anion gap 7 5 - 15  Procalcitonin     Status: None   Collection Time: 10/05/17  4:48 AM  Result Value Ref Range   Procalcitonin <0.10 ng/mL    Comment:        Interpretation: PCT (Procalcitonin) <= 0.5 ng/mL: Systemic infection (sepsis) is not likely. Local bacterial infection is possible. (NOTE)         ICU PCT Algorithm               Non ICU PCT Algorithm    ----------------------------     ------------------------------         PCT < 0.25 ng/mL                 PCT < 0.1 ng/mL     Stopping of antibiotics            Stopping of antibiotics       strongly encouraged.               strongly  encouraged.    ----------------------------     ------------------------------       PCT level decrease by               PCT < 0.25 ng/mL       >= 80% from peak PCT       OR PCT 0.25 - 0.5 ng/mL          Stopping of antibiotics                                             encouraged.     Stopping of antibiotics           encouraged.    ----------------------------     ------------------------------       PCT level decrease by              PCT >= 0.25 ng/mL       < 80% from peak PCT        AND PCT >= 0.5 ng/mL            Continuin g antibiotics                                              encouraged.       Continuing antibiotics            encouraged.    ----------------------------     ------------------------------     PCT level increase compared          PCT > 0.5 ng/mL         with peak PCT AND  PCT >= 0.5 ng/mL             Escalation of antibiotics                                          strongly encouraged.      Escalation of antibiotics        strongly encouraged.   Glucose, capillary     Status: None   Collection Time: 10/05/17  7:31 AM  Result Value Ref Range   Glucose-Capillary 90 65 - 99 mg/dL   Comment 1 Notify RN    Comment 2 Document in Chart    Ct Abdomen Pelvis Wo Contrast  Result Date: 10/04/2017 CLINICAL DATA:  Abdominal pain and fever. Evaluate for retroperitoneal hemorrhage or abscess. EXAM: CT ABDOMEN AND PELVIS WITHOUT CONTRAST TECHNIQUE: Multidetector CT imaging of the abdomen and pelvis was performed following the standard protocol without IV contrast. COMPARISON:  Abdominal MRI 02/22/2017 FINDINGS: Lower chest: Asymmetric elevation right hemidiaphragm with bibasilar atelectasis noted. Small left pleural effusion. Hepatobiliary: No focal abnormality in the liver on this study without intravenous contrast. Gallbladder surgically absent. No intrahepatic or extrahepatic biliary dilation. Pancreas: No focal mass lesion. No dilatation of the main duct. No  intraparenchymal cyst. No peripancreatic edema. Spleen: No splenomegaly. No focal mass lesion. Adrenals/Urinary Tract: No adrenal nodule or mass. Right kidney is surgically absent. 2.4 cm exophytic lesion upper pole right kidney has attenuation too high to be a simple cyst but cannot be characterized on this study without intravenous contrast material. Lesion is stable in size comparing to the MRI of the 8 months ago. No left hydroureteronephrosis. The urinary bladder appears normal for the degree of distention. Stomach/Bowel: Gastrostomy tube is evident. Duodenum is normally positioned as is the ligament of Treitz. No small bowel wall thickening. No small bowel dilatation. Terminal ileum not well seen. The appendix is not visualized, but there is no edema or inflammation in the region of the cecum. No gross colonic mass. No colonic wall thickening. No substantial diverticular change. Vascular/Lymphatic: There is abdominal aortic atherosclerosis without aneurysm. There is no gastrohepatic or hepatoduodenal ligament lymphadenopathy. No intraperitoneal or retroperitoneal lymphadenopathy. No pelvic sidewall lymphadenopathy. Reproductive: The prostate gland and seminal vesicles have normal imaging features. Other: No intraperitoneal free fluid. Musculoskeletal: In the midline, 3-4 cm cranial to the umbilicus, a track of gas and debris is identified in the subcutaneous fat of the anterior abdominal wall. This tracks deep into the rectus sheath (images 56-61 of series 2) and suggests abscess. This does appear to remain confined with in the rectus sheath and there is no adjacent bowel wall thickening or definite findings to suggest that this represents an enterocutaneous fistula. There is no adjacent loop of colon. Bone windows reveal no worrisome lytic or sclerotic osseous lesions. 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 Hemorrhage and edema are identified in the right axillary region and tracking down  the right lateral chest wall, likely related to the proximal humerus fracture. IMPRESSION: 1. Tubular track of fluid and debris measuring about 2 cm in diameter tracks from this scan about 3-4 cm cranial to the umbilicus deep into the right paramidline rectus sheath. Anterior abdominal wall abscess could have this appearance. There is no underlying small bowel wall thickening to suggest enterocutaneous fistula. 2. No evidence for retroperitoneal hematoma. 3. Edema/hemorrhage is identified in the right axillary region tracking down  the right lateral chest wall, compatible with the acute proximal humerus fracture. Electronically Signed   By: Misty Stanley M.D.   On: 10/04/2017 20:16   US Renal  Result Date: 10/04/2017 CLINICAL DATA:  Flank pain, urinary tract infection, acute renal insufficiency. History of previous right nephrectomy. EXAM: RENAL / URINARY TRACT ULTRASOUND COMPLETE COMPARISON:  Abdominal MRI of February 26 FINDINGS: Right Kidney: The right kidney is surgically absent. Left Kidney: Length: 13.3 cm. Echogenicity within normal limits. No mass or hydronephrosis visualized. Bladder: Appears normal for degree of bladder distention. IMPRESSION: Normal appearance of the left kidney and urinary bladder. Previous right nephrectomy. Electronically Signed   By: David  Martinique M.D.   On: 10/04/2017 15:31   Dg Chest Port 1 View  Result Date: 10/03/2017 CLINICAL DATA:  51 year old male status post central line placement. EXAM: PORTABLE CHEST 1 VIEW COMPARISON:  Chest x-ray 10/02/2017. FINDINGS: There is a left-sided subclavian central venous catheter with tip terminating in the superior cavoatrial junction. Study is limited by lack of visualization of the left lung base. With this limitation in mind, lung volumes are very low. Elevation of the right hemidiaphragm is unchanged. No acute consolidative airspace disease. No definite pleural effusions (although a small left pleural effusion cannot be excluded  secondary to the limitations of today's study). No evidence of pulmonary edema. Heart size is normal. Mediastinal contours are unremarkable. No definite pneumothorax. IMPRESSION: 1. New left subclavian central venous catheter with tip at the superior cavoatrial junction. 2. Persistent low lung volumes. Electronically Signed   By: Vinnie Langton M.D.   On: 10/03/2017 13:17      Assessment/Plan Principal Problem:   Acute lower UTI Active Problems:   Anoxic brain injury ([place])   History of atrial fibrillation   FTT (failure to thrive) in adult   Addisons disease ([place])   Fracture of humerus anatomical neck, right, closed, initial encounter   AKI (acute kidney injury) ([place])   UTI (urinary tract infection)  Wound - open and actively draining - daily packing with iodioform - I do not suspect this wound is the source of the pt's fever or tachycardia  We will follow. Thank you for the consult.   Kalman Drape, [facility] Surgery 10/05/2017, 12:47 PM Pager: [phone] Consults: [phone] Mon-Fri 7:00 am-4:30 pm Sat-Sun 7:00 am-11:30 am

## 2017-10-06 DIAGNOSIS — N3000 Acute cystitis without hematuria: Secondary | ICD-10-CM

## 2017-10-06 DIAGNOSIS — Z8679 Personal history of other diseases of the circulatory system: Secondary | ICD-10-CM

## 2017-10-06 LAB — GLUCOSE, CAPILLARY
Glucose-Capillary: 140 mg/dL — ABNORMAL HIGH (ref 65–99)
Glucose-Capillary: 144 mg/dL — ABNORMAL HIGH (ref 65–99)
Glucose-Capillary: 85 mg/dL (ref 65–99)
Glucose-Capillary: 92 mg/dL (ref 65–99)
Glucose-Capillary: 95 mg/dL (ref 65–99)
Glucose-Capillary: 96 mg/dL (ref 65–99)

## 2017-10-06 LAB — PROCALCITONIN: PROCALCITONIN: 0.15 ng/mL

## 2017-10-06 MED ORDER — CEFDINIR 125 MG/5ML PO SUSR
300.0000 mg | Freq: Two times a day (BID) | ORAL | Status: DC
  Administered 2017-10-06 – 2017-10-07 (×3): 300 mg
  Filled 2017-10-06 (×4): qty 15

## 2017-10-06 MED ORDER — CEFUROXIME AXETIL 250 MG/5ML PO SUSR
250.0000 mg | Freq: Two times a day (BID) | ORAL | Status: DC
  Filled 2017-10-06: qty 5

## 2017-10-06 MED ORDER — HYDROCORTISONE NA SUCCINATE PF 100 MG IJ SOLR
50.0000 mg | Freq: Two times a day (BID) | INTRAMUSCULAR | Status: DC
  Administered 2017-10-06 – 2017-10-07 (×2): 50 mg via INTRAVENOUS
  Filled 2017-10-06 (×3): qty 1

## 2017-10-06 MED ORDER — VITAL AF 1.2 CAL PO LIQD
237.0000 mL | Freq: Four times a day (QID) | ORAL | Status: DC
  Administered 2017-10-06: 237 mL
  Filled 2017-10-06 (×7): qty 237

## 2017-10-06 MED ORDER — PANTOPRAZOLE SODIUM 40 MG PO PACK
40.0000 mg | PACK | Freq: Every day | ORAL | Status: DC
  Administered 2017-10-06 – 2017-10-07 (×2): 40 mg
  Filled 2017-10-06 (×2): qty 20

## 2017-10-06 NOTE — Care Management Important Message (Signed)
Important Message  Patient Details  Name: Cordae Mccarey MRN: [id number] Date of Birth: October 05, 1966   Medicare Important Message Given:  Yes    Caren Macadam 10/06/2017, 11:01 AMImportant Message  Patient Details  Name: Ching Rabideau MRN: [id number] Date of Birth: 10-07-1966   Medicare Important Message Given:  Yes    Caren Macadam 10/06/2017, 11:00 AM

## 2017-10-06 NOTE — Progress Notes (Signed)
PT demonstrates hands on understanding of Flutter device. 

## 2017-10-06 NOTE — Progress Notes (Signed)
  Speech Language Pathology Treatment: Dysphagia  Patient Details Name: [name] MRN: [id number] DOB: 02/09/1966 Today's Date: 10/06/2017 Time: 4098-1191 SLP Time Calculation (min) (ACUTE ONLY): 25 min  Assessment / Plan / Recommendation Clinical Impression  Pt today observed with po of honey thick Sprite and apple juice, nectar thickened Sprite, ice chips, pudding, cracker  Boluses.  Oral transiting coordination suspected to be compromised - as observed on his most recent [facility] 03/2017.  Immediate cough post-swallow of nectar likely due to aspiration - with pt demonstrating decreased cough - pna risk increased.  No indications of aspiration with 80% of boluses of honey, ice chips, pudding nor cracker.     Of note, on MBS earlier this year, pt does cough with aspiration - therefore recommend to advance diet to allow solids including- dys3/honey.    Educated to pt aspiration precautions using teach back.    HPI HPI: 51 yo male adm to [facility] after fall at SNF with resultant right humerus fx - PMH + for anoxic brain injury, seizures, feeding tube placement, Afib, Addison's disease, anxiety, aspiration pna, recurrent UTIs, mental d/o, quadriparesis, reflux.  Per RN, pt coughing with intake today and swallow evaluation ordered.  Most recent MBS April 2018 showed aspiration of thin and nectar with cough response.  No aspiration of honey thick and soft diet recommended.        SLP Plan  Continue with current plan of care       Recommendations  Diet recommendations: Honey-thick liquid;Dysphagia 3 (mechanical soft) Liquids provided via: Cup;No straw Medication Administration: Whole meds with puree Supervision: Intermittent supervision to cue for compensatory strategies Compensations: Minimize environmental distractions;Slow rate;Small sips/bites;Lingual sweep for clearance of pocketing Postural Changes and/or Swallow Maneuvers: Seated upright 90 degrees;Upright 30-60 min after meal                 Oral Care Recommendations: Oral care BID SLP Visit Diagnosis: Dysphagia, oropharyngeal phase (R13.12) Plan: Continue with current plan of care       GO                Chales Abrahams 10/06/2017, 12:34 PM  Donavan Burnet, MS [facility] SLP [phone]

## 2017-10-06 NOTE — Progress Notes (Signed)
PROGRESS NOTE    [name]  ONG:[id number] DOB: [birth year] DOA: 10/02/2017 PCP: Georgann Housekeeper, MD    Brief Narrative:  51 y.o.malewith a past medical history significant for TBI, seizures, anoxic brain injury, quadriplegia, atrial fibrillation currently normal sinus rhythm, hypothyroidism, cardiomyopathy, Addison's disease, PEG tube, and hyperlipidemia who presents with right arm pain after a fall. EMS reports that patient had a fall out of bed at some point overnight.According to EMS, patient had imaging at his rehabilitation facility, [facility], where he was found to have either a fracture or dislocation of the right humerus. Patient found to have  tachycardic and hypotensive ,Chest x-ray showed no evidence of pneumonia. Shoulder x-ray revealed humeral neck fracture.   Patient found to have a UTI, started on meropenem. Subsequently transferred to stepdown due to atrial fibrillation with rapid ventricular response, hypotension.  Assessment & Plan:   Principal Problem:   Acute lower UTI Active Problems:   Anoxic brain injury (HCC)   History of atrial fibrillation   FTT (failure to thrive) in adult   Addisons disease (HCC)   Fracture of humerus anatomical neck, right, closed, initial encounter   AKI (acute kidney injury) (HCC)   UTI (urinary tract infection)  Urinary tract infection/probable sepsis present on admission -history of ESBL, urine culture positive for Escherichia coli greater than 100,000 colonies, continue meropenem, Blood culture 2 , no growth so far Stress dose steroids secondary to hypotension, blood pressure has since improved. Taper stress dose steroids gradually CT abdomen pelvis to rule out nephrolithiasis Pro calcitonin negative Renal ultrasound shows no hydronephrosis  History of atrial fibrillation-went fromnormal sinus rhythm and sinus tachycardia to A fib , likely secondary to the above, transferred  patient to stepdown, patient started on  cardizem gtt for rate control. Currently in A. fib but rate is controlled. Status post central line placement by PCCM , off Cardizem drip, resume home dose of metoprolol, HR improved   . Hold off on anticoagulation given anemia   History of Addison's disease-not and chronic steroids, a.m. Cortisol around 11. Started patient on stress dose steroids given   low blood pressure  History of traumatic brain injury, continue with seizure prophylaxis, psychiatric medications  History of seizure disorder-continue Depakote, Depakote level 28  Fracture of the right humerus-Acute minimally displaced, likely impacted, potentially mildly comminuted fracture of the surgical neck of the right humerus, Dr. Shon Baton on call for [facility] has been consulted. They recommend medical management and follow-up with Dr. Aundria Rud. Arm sling for 2 weeks  Acute kidney injury-baseline creatinine around 0.9, presented with a creatinine of 1.53, improving  Anemia of Chronic Disease, including the associated chronic disease state/acute blood loss anemia -currently patient has no obvious bleeding, baseline hemoglobin was 12.2 on 06/04/17. Hemoglobin dropped to 7.4. Hemoglobin 8.5 after transfusion of one unit of packed red blood cells.   Anterior abdominal wall abscess on CT-Gen. surgery consulted to evaluate. This also may be a hematoma in the setting of his fall  DVT prophylaxsis SCD's  Code Status: Full Family Communication: Pt in room, family not at bedside Disposition Plan: SNF likely in 24hrs  Consultants:     Procedures:     Antimicrobials: Anti-infectives    Start     Dose/Rate Route Frequency Ordered Stop   10/06/17 1400  cefUROXime (CEFTIN) 250 MG/5ML suspension 250 mg  Status:  Discontinued     250 mg Per Tube Every 12 hours 10/06/17 0912 10/06/17 1103   10/06/17 1200  cefdinir (OMNICEF) 125  MG/5ML suspension 300 mg     300 mg Per Tube 2 times daily 10/06/17 1103     10/03/17 0100  meropenem  (MERREM) 1 g in sodium chloride 0.9 % 100 mL IVPB  Status:  Discontinued     1 g 200 mL/hr over 30 Minutes Intravenous Every 8 hours 10/03/17 0040 10/06/17 0912   10/03/17 0000  aztreonam (AZACTAM) 1 g in dextrose 5 % 50 mL IVPB  Status:  Discontinued     1 g 100 mL/hr over 30 Minutes Intravenous  Once 10/02/17 2354 10/03/17 0039       Subjective: Without complaints at this time  Objective: Vitals:   10/05/17 1705 10/05/17 2048 10/06/17 0511 10/06/17 1345  BP: 95/62 109/65 109/80 112/65  Pulse: 81 81 89 87  Resp:  18 18 20   Temp: 98.2 F (36.8 C) 97.8 F (36.6 C) 98.3 F (36.8 C) 98.5 F (36.9 C)  TempSrc: Oral Oral Axillary   SpO2: 100% 98% 99% 100%  Weight:      Height:        Intake/Output Summary (Last 24 hours) at 10/06/17 1659 Last data filed at 10/06/17 1044  Gross per 24 hour  Intake              677 ml  Output                0 ml  Net              677 ml   Filed Weights   10/03/17 0314 10/04/17 0430 10/05/17 0311  Weight: 108 kg (238 lb 1.6 oz) 109 kg (240 lb 4.8 oz) 111.2 kg (245 lb 2.4 oz)    Examination:  General exam: Appears calm and comfortable  Respiratory system: Clear to auscultation. Respiratory effort normal. Cardiovascular system: S1 & S2 heard, RRR. Gastrointestinal system: Abdomen is nondistended, soft and nontender. No organomegaly or masses felt. Normal bowel sounds heard. Central nervous system: Alert and oriented. No focal neurological deficits. Extremities: Symmetric 5 x 5 power. Skin: No rashes, lesions Psychiatry: Judgement and insight appear normal. Mood & affect appropriate.   Data Reviewed: I have personally reviewed following labs and imaging studies  CBC:  Recent Labs Lab 10/02/17 1622 10/03/17 0627 10/04/17 0435 10/05/17 0448  WBC 12.2* 8.2 4.8 5.5  NEUTROABS 8.6*  --   --   --   HGB 11.8* 10.3* 7.4* 8.5*  HCT 35.9* 31.5* 22.3* 25.1*  MCV 85.7 86.1 86.1 86.9  PLT 118* 122* 80* 82*   Basic Metabolic  Panel:  Recent Labs Lab 10/02/17 1622 10/03/17 0627 10/03/17 0854 10/03/17 1700 10/04/17 0435 10/04/17 1958 10/05/17 0448  NA 139 141  --   --  142  --  139  K 4.6 4.3  --   --  3.7  --  4.1  CL 103 106  --   --  109  --  107  CO2 25 24  --   --  26  --  25  GLUCOSE 94 85  --   --  87  --  94  BUN 17 18  --   --  18  --  12  CREATININE 1.53* 1.33*  --   --  1.01  --  0.71  CALCIUM 8.9 8.6*  --   --  7.8*  --  8.0*  MG  --   --  1.6* 1.6* 1.7 2.1  --   PHOS  --   --   --  3.2 3.3 3.6  --    GFR: Estimated Creatinine Clearance: 141.7 mL/min (by C-G formula based on SCr of 0.71 mg/dL). Liver Function Tests:  Recent Labs Lab 10/02/17 1622 10/04/17 0435 10/05/17 0448  AST 20 13* 12*  ALT 7* 8* 7*  ALKPHOS 62 39 42  BILITOT 0.7 0.7 0.7  PROT 6.9 5.2* 5.5*  ALBUMIN 3.5 2.6* 2.5*   No results for input(s): LIPASE, AMYLASE in the last 168 hours. No results for input(s): AMMONIA in the last 168 hours. Coagulation Profile:  Recent Labs Lab 10/02/17 1622  INR 1.04   Cardiac Enzymes:  Recent Labs Lab 10/04/17 1257 10/04/17 1958 10/05/17 0448  TROPONINI <0.03 <0.03 <0.03   BNP (last 3 results) No results for input(s): PROBNP in the last 8760 hours. HbA1C: No results for input(s): HGBA1C in the last 72 hours. CBG:  Recent Labs Lab 10/05/17 2045 10/05/17 2347 10/06/17 0455 10/06/17 0723 10/06/17 1138  GLUCAP 108* 102* 96 95 140*   Lipid Profile: No results for input(s): CHOL, HDL, LDLCALC, TRIG, CHOLHDL, LDLDIRECT in the last 72 hours. Thyroid Function Tests: No results for input(s): TSH, T4TOTAL, FREET4, T3FREE, THYROIDAB in the last 72 hours. Anemia Panel:  Recent Labs  10/04/17 1257 10/04/17 1330  VITAMINB12  --  272  FOLATE  --  7.9  FERRITIN  --  176  TIBC  --  160*  IRON  --  18*  RETICCTPCT 2.7  --    Sepsis Labs:  Recent Labs Lab 10/02/17 1844 10/04/17 1447 10/05/17 0448 10/06/17 0628  PROCALCITON  --  <0.10 <0.10 0.15   LATICACIDVEN 1.76  --   --   --     Recent Results (from the past 240 hour(s))  Urine culture     Status: Abnormal   Collection Time: 10/02/17 11:25 PM  Result Value Ref Range Status   Specimen Description URINE, CATHETERIZED  Final   Special Requests NONE  Final   Culture >=100,000 COLONIES/mL ESCHERICHIA COLI (A)  Final   Report Status 10/05/2017 FINAL  Final   Organism ID, Bacteria ESCHERICHIA COLI (A)  Final      Susceptibility   Escherichia coli - MIC*    AMPICILLIN >=32 RESISTANT Resistant     CEFAZOLIN 16 SENSITIVE Sensitive     CEFTRIAXONE <=1 SENSITIVE Sensitive     CIPROFLOXACIN 0.5 SENSITIVE Sensitive     GENTAMICIN <=1 SENSITIVE Sensitive     IMIPENEM <=0.25 SENSITIVE Sensitive     NITROFURANTOIN <=16 SENSITIVE Sensitive     TRIMETH/SULFA >=320 RESISTANT Resistant     AMPICILLIN/SULBACTAM >=32 RESISTANT Resistant     PIP/TAZO 8 SENSITIVE Sensitive     Extended ESBL NEGATIVE Sensitive     * >=100,000 COLONIES/mL ESCHERICHIA COLI  MRSA PCR Screening     Status: None   Collection Time: 10/03/17  3:19 AM  Result Value Ref Range Status   MRSA by PCR NEGATIVE NEGATIVE Final    Comment:        The GeneXpert MRSA Assay (FDA approved for NASAL specimens only), is one component of a comprehensive MRSA colonization surveillance program. It is not intended to diagnose MRSA infection nor to guide or monitor treatment for MRSA infections.   Culture, blood (routine x 2)     Status: None (Preliminary result)   Collection Time: 10/04/17  1:10 PM  Result Value Ref Range Status   Specimen Description BLOOD RIGHT HAND  Final   Special Requests IN PEDIATRIC BOTTLE Blood Culture adequate volume  Final   Culture   Final    NO GROWTH 2 DAYS Performed at [facility] Lab, [address], [place], [place] [address]    Report Status PENDING  Incomplete  Culture, blood (routine x 2)     Status: None (Preliminary result)   Collection Time: 10/04/17  1:23 PM  Result Value Ref  Range Status   Specimen Description BLOOD RIGHT HAND  Final   Special Requests IN PEDIATRIC BOTTLE Blood Culture adequate volume  Final   Culture   Final    NO GROWTH 2 DAYS Performed at [facility], [address] [place], [place] [address]    Report Status PENDING  Incomplete     Radiology Studies: Ct Abdomen Pelvis Wo Contrast  Result Date: 10/04/2017 CLINICAL DATA:  Abdominal pain and fever. Evaluate for retroperitoneal hemorrhage or abscess. EXAM: CT ABDOMEN AND PELVIS WITHOUT CONTRAST TECHNIQUE: Multidetector CT imaging of the abdomen and pelvis was performed following the standard protocol without IV contrast. COMPARISON:  Abdominal MRI 02/22/2017 FINDINGS: Lower chest: Asymmetric elevation right hemidiaphragm with bibasilar atelectasis noted. Small left pleural effusion. Hepatobiliary: No focal abnormality in the liver on this study without intravenous contrast. Gallbladder surgically absent. No intrahepatic or extrahepatic biliary dilation. Pancreas: No focal mass lesion. No dilatation of the main duct. No intraparenchymal cyst. No peripancreatic edema. Spleen: No splenomegaly. No focal mass lesion. Adrenals/Urinary Tract: No adrenal nodule or mass. Right kidney is surgically absent. 2.4 cm exophytic lesion upper pole right kidney has attenuation too high to be a simple cyst but cannot be characterized on this study without intravenous contrast material. Lesion is stable in size comparing to the MRI of the 8 months ago. No left hydroureteronephrosis. The urinary bladder appears normal for the degree of distention. Stomach/Bowel: Gastrostomy tube is evident. Duodenum is normally positioned as is the ligament of Treitz. No small bowel wall thickening. No small bowel dilatation. Terminal ileum not well seen. The appendix is not visualized, but there is no edema or inflammation in the region of the cecum. No gross colonic mass. No colonic wall thickening. No substantial diverticular change.  Vascular/Lymphatic: There is abdominal aortic atherosclerosis without aneurysm. There is no gastrohepatic or hepatoduodenal ligament lymphadenopathy. No intraperitoneal or retroperitoneal lymphadenopathy. No pelvic sidewall lymphadenopathy. Reproductive: The prostate gland and seminal vesicles have normal imaging features. Other: No intraperitoneal free fluid. Musculoskeletal: In the midline, 3-4 cm cranial to the umbilicus, a track of gas and debris is identified in the subcutaneous fat of the anterior abdominal wall. This tracks deep into the rectus sheath (images 56-61 of series 2) and suggests abscess. This does appear to remain confined with in the rectus sheath and there is no adjacent bowel wall thickening or definite findings to suggest that this represents an enterocutaneous fistula. There is no adjacent loop of colon. Bone windows reveal no worrisome lytic or sclerotic osseous lesions. 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 Hemorrhage and edema are identified in the right axillary region and tracking down the right lateral chest wall, likely related to the proximal humerus fracture. IMPRESSION: 1. Tubular track of fluid and debris measuring about 2 cm in diameter tracks from this scan about 3-4 cm cranial to the umbilicus deep into the right paramidline rectus sheath. Anterior abdominal wall abscess could have this appearance. There is no underlying small bowel wall thickening to suggest enterocutaneous fistula. 2. No evidence for retroperitoneal hematoma. 3. Edema/hemorrhage is identified in the right axillary region tracking down the right  lateral chest wall, compatible with the acute proximal humerus fracture. Electronically Signed   By: Kennith Center M.D.   On: 10/04/2017 20:16    Scheduled Meds: . acetaminophen  650 mg Oral Once  . cefdinir  300 mg Per Tube BID  . Chlorhexidine Gluconate Cloth  6 each Topical Q0600  . divalproex  500 mg Oral TID  . feeding supplement (VITAL  AF 1.2 CAL)  237 mL Per Tube QID  . free water  240 mL Per Tube Q6H  . haloperidol lactate  2.5 mg Intravenous Once  . hydrocortisone sod succinate (SOLU-CORTEF) inj  50 mg Intravenous Q12H  . levothyroxine  200 mcg Per Tube QAC breakfast  . metoprolol tartrate  12.5 mg Per Tube BID  . multivitamin  15 mL Per Tube Q1200  . pantoprazole sodium  40 mg Per Tube Daily  . PARoxetine  20 mg Per Tube Daily  . polyethylene glycol  17 g Per Tube BID  . polyvinyl alcohol  1 drop Both Eyes QID  . risperiDONE  2 mg Sublingual BID  . sennosides  15 mL Per Tube BID  . traZODone  50 mg Per Tube BID   Continuous Infusions: . sodium chloride 100 mL/hr at 10/05/17 1712  . sodium chloride    . diltiazem (CARDIZEM) infusion Stopped (10/04/17 0602)     LOS: 3 days   Malcom Selmer, Scheryl Marten, MD [facility] Pager [phone]  If 7PM-7AM, please contact night-coverage [email or web address] Password [email or web address] 10/06/2017, 4:59 PM

## 2017-10-06 NOTE — Progress Notes (Signed)
Nutrition Brief Note  RD consulted for enteral/tube feeding initiation and management.  Pt previously assessed 10/7.  RD to put in TF orders per recommendations.   Provide 237 ml (1 carton) of Vital 1.5 QID via PEG. This will provide  1420 kcal (68% of needs), 64g protein (67% of needs) and 724 ml H2O. Continue free water flushes of 240 ml every 6 hours (960 ml total).  **Addendum: Spoke with Pharmacy who states Vital 1.5 is not available in 237 ml cartons only 1L bottles. Will switch formula to Vital AF 1.2 for the time being and will continue order 237 ml QID at this time. Will adjust as needed at follow-up.  Vanessa Kick RD, LDN Clinical Nutrition Pager # [phone]

## 2017-10-06 NOTE — Final Consult Note (Signed)
Consultant Final Sign-Off Note    Assessment/Final recommendations  [name] is a 51 y.o. male followed by me for small abdominal wound   Wound care (if applicable): wick wound with 3/4 inch or 1/2 inch packing strip (use q tip to pack), cover with dry gauze and tape   Diet at discharge: per primary team   Activity at discharge: per primary team   Follow-up appointment:  Doesn't need f/u   Pending results:  Unresulted Labs    Start     Ordered   10/05/17 0500  Occult blood card to lab, stool  Daily,   R     10/04/17 1252       Medication recommendations:   Other recommendations:    Thank you for allowing [place] to participate in the care of your patient!  Please consult [place] again if you have further needs for your patient.  Gaynelle Adu M 10/06/2017 10:37 AM    Subjective  nonverbal   Objective  Vital signs in last 24 hours: Temp:  [97.8 F (36.6 C)-98.4 F (36.9 C)] 98.3 F (36.8 C) (10/10 0511) Pulse Rate:  [72-95] 89 (10/10 0511) Resp:  [14-21] 18 (10/10 0511) BP: (92-121)/(52-86) 109/80 (10/10 0511) SpO2:  [98 %-100 %] 99 % (10/10 0511)  General: Obese male pt in nad Soft, obese, nt, nd  G tube intact/secure Small wound just above and left to umbilicus - no cellulitis, induration, fluctuance. Trace purulent drainage    Pertinent labs and Studies:  Recent Labs  10/04/17 0435 10/05/17 0448  WBC 4.8 5.5  HGB 7.4* 8.5*  HCT 22.3* 25.1*   BMET  Recent Labs  10/04/17 0435 10/05/17 0448  NA 142 139  K 3.7 4.1  CL 109 107  CO2 26 25  GLUCOSE 87 94  BUN 18 12  CREATININE 1.01 0.71  CALCIUM 7.8* 8.0*   No results for input(s): LABURIN in the last 72 hours. Results for orders placed or performed during the hospital encounter of 10/02/17  Urine culture     Status: Abnormal   Collection Time: 10/02/17 11:25 PM  Result Value Ref Range Status   Specimen Description URINE, CATHETERIZED  Final   Special Requests NONE  Final   Culture  >=100,000 COLONIES/mL ESCHERICHIA COLI (A)  Final   Report Status 10/05/2017 FINAL  Final   Organism ID, Bacteria ESCHERICHIA COLI (A)  Final      Susceptibility   Escherichia coli - MIC*    AMPICILLIN >=32 RESISTANT Resistant     CEFAZOLIN 16 SENSITIVE Sensitive     CEFTRIAXONE <=1 SENSITIVE Sensitive     CIPROFLOXACIN 0.5 SENSITIVE Sensitive     GENTAMICIN <=1 SENSITIVE Sensitive     IMIPENEM <=0.25 SENSITIVE Sensitive     NITROFURANTOIN <=16 SENSITIVE Sensitive     TRIMETH/SULFA >=320 RESISTANT Resistant     AMPICILLIN/SULBACTAM >=32 RESISTANT Resistant     PIP/TAZO 8 SENSITIVE Sensitive     Extended ESBL NEGATIVE Sensitive     * >=100,000 COLONIES/mL ESCHERICHIA COLI  MRSA PCR Screening     Status: None   Collection Time: 10/03/17  3:19 AM  Result Value Ref Range Status   MRSA by PCR NEGATIVE NEGATIVE Final    Comment:        The GeneXpert MRSA Assay (FDA approved for NASAL specimens only), is one component of a comprehensive MRSA colonization surveillance program. It is not intended to diagnose MRSA infection nor to guide or monitor treatment for MRSA infections.   Culture,  blood (routine x 2)     Status: None (Preliminary result)   Collection Time: 10/04/17  1:10 PM  Result Value Ref Range Status   Specimen Description BLOOD RIGHT HAND  Final   Special Requests IN PEDIATRIC BOTTLE Blood Culture adequate volume  Final   Culture   Final    NO GROWTH < 24 HOURS Performed at [facility] Lab, [address] [place], [place] [address]    Report Status PENDING  Incomplete  Culture, blood (routine x 2)     Status: None (Preliminary result)   Collection Time: 10/04/17  1:23 PM  Result Value Ref Range Status   Specimen Description BLOOD RIGHT HAND  Final   Special Requests IN PEDIATRIC BOTTLE Blood Culture adequate volume  Final   Culture   Final    NO GROWTH < 24 HOURS Performed at [facility], [address] [place], [place] [address]    Report Status  PENDING  Incomplete    Imaging: No results found.    Mary Sella. Andrey Campanile, MD, FACS General, Bariatric, & Minimally Invasive Surgery [facility], [place]

## 2017-10-07 LAB — GLUCOSE, CAPILLARY
GLUCOSE-CAPILLARY: 85 mg/dL (ref 65–99)
GLUCOSE-CAPILLARY: 98 mg/dL (ref 65–99)
Glucose-Capillary: 96 mg/dL (ref 65–99)
Glucose-Capillary: 100 mg/dL — ABNORMAL HIGH (ref 65–99)

## 2017-10-07 MED ORDER — PREDNISONE 5 MG PO TABS: 5.0000 mg | ORAL_TABLET | Freq: Every day | ORAL | Status: DC

## 2017-10-07 MED ORDER — CEFDINIR 125 MG/5ML PO SUSR: 300.0000 mg | Freq: Two times a day (BID) | ORAL | Status: DC

## 2017-10-07 NOTE — Progress Notes (Signed)
SLP Cancellation Note  Patient Details Name: [name] MRN: [id number] DOB: July 24, 1966   Cancelled treatment:       Reason Eval/Treat Not Completed: Other (comment);Patient declined, no reason specified (pt refusal, swore at SLP and threatened to hit this clinician, RN reports tolerance of diet)   Donavan Burnet, MS [facility] SLP [phone]

## 2017-10-07 NOTE — Progress Notes (Signed)
Called [place] to give report; no answer when call was transferred to receiving unit.

## 2017-10-07 NOTE — [place] FL2 (Signed)
[place] MEDICAID FL2 LEVEL OF CARE SCREENING TOOL     IDENTIFICATION  Patient Name: [name] Birthdate: [birth year] Sex: male Admission Date (Current Location): 10/02/2017  [facility] and [place] Number:  Producer, television/film/video and Address:  [facility],  [address] [place], [place] [address]      Provider Number: [id number]  Attending Physician Name and Address:  Jerald Kief, MD  Relative Name and Phone Number:       Current Level of Care: Hospital Recommended Level of Care: Skilled Nursing Facility Prior Approval Number:    Date Approved/Denied:   PASRR Number: [id number]  Discharge Plan: SNF    Current Diagnoses: Patient Active Problem List   Diagnosis Date Noted  . Fracture of humerus anatomical neck, right, closed, initial encounter 10/03/2017  . AKI (acute kidney injury) (HCC) 10/03/2017  . UTI (urinary tract infection) 10/03/2017  . Agitation 07/03/2014  . Cardiomyopathy, ischemic 06/18/2014  . 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 Acute psychosis (HCC) 04/19/2014  . Unspecified hypothyroidism 05/16/2013  . Constipation 05/16/2013  . Anemia of other chronic disease 05/16/2013  . Mental disorder 01/03/2013  . Addisons disease (HCC) 01/03/2013  . PEG (percutaneous endoscopic gastrostomy) adjustment/replacement/removal (HCC) 12/21/2011  . Hypokalemia 11/25/2011  . Thrombocytopenia (HCC) 11/25/2011  . FTT (failure to thrive) in adult 11/25/2011  . Grand mal seizure (HCC) 11/23/2011  . Depression (emotion) 11/23/2011  . History of atrial fibrillation 11/23/2011  . Other specified hemorrhagic conditions (HCC) 11/23/2011  . Glaucoma 11/23/2011  . Hypoglycemia 11/23/2011  . Chronic constipation 11/23/2011  . Anoxic brain injury (HCC) 11/12/2011  . Protein calorie malnutrition (HCC) 11/12/2011  . Hyperchloremia 11/12/2011  . Hyperthyroidism 11/12/2011  . Acute lower UTI 11/12/2011  . Dysphagia     Orientation RESPIRATION BLADDER Height &  Weight     Self, Place  O2 Incontinent Weight: 264 lb 6.4 oz (119.9 kg) Height:   (180.3 cm)  BEHAVIORAL SYMPTOMS/MOOD NEUROLOGICAL BOWEL NUTRITION STATUS      Incontinent Diet (DYS 3)  AMBULATORY STATUS COMMUNICATION OF NEEDS Skin   Limited Assist Verbally Normal                       Personal Care Assistance Level of Assistance  Bathing, Feeding, Dressing Bathing Assistance: Maximum assistance Feeding assistance: Limited assistance Dressing Assistance: Maximum assistance     Functional Limitations Info  Sight, Hearing, Speech Sight Info: Adequate Hearing Info: Adequate Speech Info: Adequate    SPECIAL CARE FACTORS FREQUENCY                       Contractures Contractures Info: Not present    Additional Factors Info  Code Status, Allergies Code Status Info: Full Code  Allergies Info: Ativan Lorazepam, Latex, Morphine And Related, Penicillins, Pyridium Phenazopyridine Hcl, Soy Allergy           Current Medications (10/07/2017):  This is the current hospital active medication list Current Facility-Administered Medications  Medication Dose Route Frequency Provider Last Rate Last Dose  . 0.9 %  sodium chloride infusion   Intravenous Continuous Richarda Overlie, MD 100 mL/hr at 10/07/17 0354    . 0.9 %  sodium chloride infusion   Intravenous Once Richarda Overlie, MD      . acetaminophen (TYLENOL) tablet 650 mg  650 mg Per Tube Q4H PRN Hillary Bow, DO   650 mg at 10/03/17 5621  . acetaminophen (TYLENOL) tablet 650 mg  650 mg Oral  Once Richarda Overlie, MD      . cefdinir (OMNICEF) 125 MG/5ML suspension 300 mg  300 mg Per Tube BID Jerald Kief, MD   300 mg at 10/07/17 1132  . Chlorhexidine Gluconate Cloth 2 % PADS 6 each  6 each Topical Q0600 Richarda Overlie, MD   6 each at 10/06/17 0600  . diltiazem (CARDIZEM) 100 mg in dextrose 5 % 100 mL (1 mg/mL) infusion  5-15 mg/hr Intravenous Titrated Richarda Overlie, MD   Stopped at 10/04/17 0602  . divalproex  (DEPAKOTE SPRINKLE) capsule 500 mg  500 mg Oral TID Hillary Bow, DO   500 mg at 10/07/17 1142  . feeding supplement (VITAL AF 1.2 CAL) liquid 237 mL  237 mL Per Tube QID Jerald Kief, MD   237 mL at 10/06/17 2327  . fentaNYL (SUBLIMAZE) injection 25 mcg  25 mcg Intravenous Q2H PRN Richarda Overlie, MD   25 mcg at 10/06/17 2029  . free water 240 mL  240 mL Per Tube Q6H Lyda Perone M, DO   240 mL at 10/07/17 1000  . haloperidol lactate (HALDOL) injection 2.5 mg  2.5 mg Intravenous Once Kirby-Graham, Beather Arbour, NP      . hydrocortisone sodium succinate (SOLU-CORTEF) 100 MG injection 50 mg  50 mg Intravenous Q12H Jerald Kief, MD   50 mg at 10/07/17 1131  . iopamidol (ISOVUE-300) 61 % injection 30 mL  30 mL Oral Once PRN Richarda Overlie, MD   30 mL at 10/04/17 1609  . levothyroxine (SYNTHROID, LEVOTHROID) tablet 200 mcg  200 mcg Per Tube QAC breakfast Richarda Overlie, MD   200 mcg at 10/07/17 1133  . metoprolol tartrate (LOPRESSOR) tablet 12.5 mg  12.5 mg Per Tube BID Richarda Overlie, MD   12.5 mg at 10/07/17 1130  . multivitamin liquid 15 mL  15 mL Per Tube Q1200 Lyda Perone M, DO   15 mL at 10/07/17 1131  . ondansetron (ZOFRAN) tablet 4 mg  4 mg Oral Q6H PRN Hillary Bow, DO       Or  . ondansetron [facility]) injection 4 mg  4 mg Intravenous Q6H PRN Hillary Bow, DO      . pantoprazole sodium (PROTONIX) 40 mg/20 mL oral suspension 40 mg  40 mg Per Tube Daily Jerald Kief, MD   40 mg at 10/07/17 1150  . PARoxetine (PAXIL) tablet 20 mg  20 mg Per Tube Daily Richarda Overlie, MD   20 mg at 10/07/17 1130  . polyethylene glycol (MIRALAX / GLYCOLAX) packet 17 g  17 g Per Tube BID Lyda Perone M, DO   17 g at 10/07/17 1132  . polyvinyl alcohol (LIQUIFILM TEARS) 1.4 % ophthalmic solution 1 drop  1 drop Both Eyes QID Lyda Perone M, DO   1 drop at 10/07/17 1134  . RESOURCE THICKENUP CLEAR   Oral PRN Richarda Overlie, MD      . risperiDONE (RISPERDAL M-TABS) disintegrating tablet 2 mg  2 mg  Sublingual BID Lyda Perone M, DO   2 mg at 10/07/17 1130  . sennosides (SENOKOT) 8.8 MG/5ML syrup 15 mL  15 mL Per Tube BID Richarda Overlie, MD   15 mL at 10/07/17 1130  . traZODone (DESYREL) tablet 50 mg  50 mg Per Tube BID Richarda Overlie, MD   50 mg at 10/07/17 1132     Discharge Medications: Please see discharge summary for a list of discharge medications.  Relevant Imaging Results:  Relevant Lab Results:  Additional Information ssn:[phone]  Antionette Poles, LCSW

## 2017-10-07 NOTE — Progress Notes (Signed)
Patient discharging back to current SNF [facility]. Facility aware of patient's discharge and confirmed patient's ability to return. [facility] contacted, patient's family notified. Patient's RN can call report to [phone]), packet complete. CSW signing off, no other needs identified at this time.  Celso Sickle, [place] Clinical Social Worker [facility] Cell#: [phone]

## 2017-10-07 NOTE — Discharge Summary (Signed)
Physician Discharge Summary  [name] [name]:[id number] DOB: 07-Apr-1966 DOA: 10/02/2017  PCP: Georgann Housekeeper, MD  Admit date: 10/02/2017 Discharge date: 10/07/2017  Admitted From: SNF Disposition:  SNF  Recommendations for Outpatient Follow-up:  1. Follow up with PCP in 1-2 weeks  2. Please complete 4 more days of cefdinir per tube on discharge  Discharge Condition:Improved CODE STATUS:Full Diet recommendation: Dysphagia 3 with honey thick liquids   Brief/Interim Summary: 51 y.o.malewith a past medical history significant for TBI, seizures, anoxic brain injury, quadriplegia, atrial fibrillation currently normal sinus rhythm, hypothyroidism, cardiomyopathy, Addison's disease, PEG tube, and hyperlipidemia who presents with right arm pain after a fall. EMS reports that patient had a fall out of bed at some point overnight.According to EMS, patient had imaging at his rehabilitation facility, [facility], where he was found to have either a fracture or dislocation of the right humerus.Patient found to have tachycardic and hypotensive ,Chest x-ray showed no evidence of pneumonia. Shoulder x-ray revealed humeral neck fracture.   Patient found to have a UTI, started on meropenem. Subsequently transferred to stepdown due to atrial fibrillation with rapid ventricular response, hypotension.  Discharge Diagnoses:  Principal Problem:   Acute lower UTI Active Problems:   Anoxic brain injury (HCC)   History of atrial fibrillation   FTT (failure to thrive) in adult   Addisons disease (HCC)   Fracture of humerus anatomical neck, right, closed, initial encounter   AKI (acute kidney injury) (HCC)   UTI (urinary tract infection)  Urinary tract infection/probable sepsis present on admission -history of ESBL,urine culture positive for Escherichia coli greater than 100,000 colonies,continue meropenem, Blood culture 2 , no growth so far Stress dose steroids secondary to hypotension, blood  pressure has since improved. Patient to complete prednisone taper Pro calcitonin was negative Renal ultrasound shows no hydronephrosis Patient to complete 4 more days of cefdinir on discharge  History of atrial fibrillation-went fromnormal sinus rhythm and sinus tachycardia to A fib , likely secondary to the above, transferred patient to stepdown, patient started on cardizem gtt for rate control. Currently in A. fib but rate is controlled. Status post central line placement by PCCM , off Cardizem drip, resume home dose of metoprolol, HR improved . Hold off on anticoagulation given anemia   History of Addison's disease-not and chronic steroids, a.m. Cortisol around 11. Started patient on stress dose steroids given low blood pressure, steroids to be weaned  History of traumatic brain injury, continued with seizure prophylaxis, psychiatric medications  History of seizure disorder-continue Depakote, Depakote level 28  Fracture of the right humerus-Acute minimally displaced, likely impacted, potentially mildly comminuted fracture of the surgical neck of the right humerus, Dr. Shon Baton on call for [facility] has been consulted. They recommend medical management and follow-up with Dr. Aundria Rud. Arm sling for 2 weeks  Acute kidney injury-baseline creatinine around 0.9, presented with a creatinine of 1.53, improved  Anemia of Chronic Disease, including the associated chronic disease state/acute blood loss anemia -currently patient has no obvious bleeding, baseline hemoglobin was 12.2 on 06/04/17. Hemoglobin dropped to 7.4.Hemoglobin 8.5 after transfusion of one unit of packed red blood cells. -Remained stable  Anterior abdominal wall abscess on CT-Gen. surgery consulted to evaluate. This also may be a hematoma in the setting of his fall -Patient remained stable  DVT prophylaxsis SCD's  Discharge Instructions   Allergies as of 10/07/2017      Reactions   Ativan [lorazepam] Other  (See Comments)   Unknown (not listed on physician's orders from [facility] 07/05/15)   Latex Dermatitis   Morphine And Related Other (See Comments)   Listed on physician's orders from [facility] 07/05/15   Penicillins Other (See Comments)   Listed on physician's orders from [facility] 07/05/15 Has patient had a PCN reaction causing immediate rash, facial/tongue/throat swelling, SOB or lightheadedness with hypotension: unknown Has patient had a PCN reaction causing severe rash involving mucus membranes or skin necrosis: unknown Has patient had a PCN reaction that required hospitalization: no Has patient had a PCN reaction occurring within the last 10 years: yes If all of the above answers are "NO", then may proceed with Cephalosporin [place]   Pyridium [phenazopyridine Hcl] Other (See Comments)   Listed on physician's orders from [facility] 07/05/15   Soy Allergy Other (See Comments)   Listed on physician's orders from [facility] 07/05/15      Medication List    TAKE these medications   acetaminophen 325 MG tablet Commonly known as:  TYLENOL Place 650 mg into feeding tube every 4 (four) hours as needed for mild pain.   cefdinir 125 MG/5ML suspension Commonly known as:  OMNICEF Place 12 mLs (300 mg total) into feeding tube 2 (two) times daily.   CERTAVITE/ANTIOXIDANTS Liqd 15 mLs by PEG Tube route daily at 12 noon.   divalproex 125 MG capsule Commonly known as:  DEPAKOTE SPRINKLE Take 500 mg by mouth 3 (three) times daily.   levothyroxine 200 MCG tablet Commonly known as:  SYNTHROID, LEVOTHROID Take 200 mcg by mouth daily before breakfast.   loratadine 10 MG tablet Commonly known as:  CLARITIN 10 mg by PEG Tube route daily.   METOPROLOL SUCCINATE ER PO 12.5 mg by PEG Tube route 2 (two) times daily. Metoprolol tartrate 6.25 mg/5 ml suspension   pantoprazole sodium 40 mg/20 mL Pack Commonly known as:  PROTONIX 40 mg by PEG Tube route daily at 12 noon.   PARoxetine 20 MG  tablet Commonly known as:  PAXIL Take 20 mg by mouth daily.   polyethylene glycol packet Commonly known as:  MIRALAX / GLYCOLAX 17 g by PEG Tube route 2 (two) times daily. Mix 17gm  In 4-8 ounces of liquid   polyvinyl alcohol 1.4 % ophthalmic solution Commonly known as:  LIQUIFILM TEARS Place 1 drop into both eyes 4 (four) times daily. 9am, 2pm, 6pm, 9pm   predniSONE 5 MG tablet Commonly known as:  DELTASONE Take 1 tablet (5 mg total) by mouth daily with breakfast.   risperiDONE 2 MG disintegrating tablet Commonly known as:  RISPERDAL M-TABS Place 2 mg under the tongue 2 (two) times daily.   sennosides 8.8 MG/5ML syrup Commonly known as:  SENOKOT Take 15 mLs by mouth 2 (two) times daily.   traZODone 50 MG tablet Commonly known as:  DESYREL Take 50 mg by mouth 2 (two) times daily.   VITAL 1.5 CAL PO Take 237 mLs by mouth as needed (For less than 50% food consumption.).      Follow-up Information    Georgann Housekeeper, MD. Schedule an appointment as soon as possible for a visit in 1 week(s).   Specialty:  Internal Medicine Contact information: [address] [place] [address] [phone]          Allergies  Allergen Reactions  . Ativan [Lorazepam] Other (See Comments)    Unknown (not listed on physician's orders from [facility] 07/05/15)  . Latex Dermatitis  . Morphine And Related Other (See Comments)    Listed on  physician's orders from [facility] 07/05/15  . Penicillins Other (See Comments)    Listed on physician's orders from [facility] 07/05/15 Has patient had a PCN reaction causing immediate rash, facial/tongue/throat swelling, SOB or lightheadedness with hypotension: unknown Has patient had a PCN reaction causing severe rash involving mucus membranes or skin necrosis: unknown Has patient had a PCN reaction that required hospitalization: no Has patient had a PCN reaction occurring within the last 10 years: yes If all of the above answers are  "NO", then may proceed with Cephalosporin [place]  . Pyridium [Phenazopyridine Hcl] Other (See Comments)    Listed on physician's orders from [facility] 07/05/15  . Soy Allergy Other (See Comments)    Listed on physician's orders from [facility] 07/05/15    Consultations:  Orthopedic Surgery  Procedures/Studies: Ct Abdomen Pelvis Wo Contrast  Result Date: 10/04/2017 CLINICAL DATA:  Abdominal pain and fever. Evaluate for retroperitoneal hemorrhage or abscess. EXAM: CT ABDOMEN AND PELVIS WITHOUT CONTRAST TECHNIQUE: Multidetector CT imaging of the abdomen and pelvis was performed following the standard protocol without IV contrast. COMPARISON:  Abdominal MRI 02/22/2017 FINDINGS: Lower chest: Asymmetric elevation right hemidiaphragm with bibasilar atelectasis noted. Small left pleural effusion. Hepatobiliary: No focal abnormality in the liver on this study without intravenous contrast. Gallbladder surgically absent. No intrahepatic or extrahepatic biliary dilation. Pancreas: No focal mass lesion. No dilatation of the main duct. No intraparenchymal cyst. No peripancreatic edema. Spleen: No splenomegaly. No focal mass lesion. Adrenals/Urinary Tract: No adrenal nodule or mass. Right kidney is surgically absent. 2.4 cm exophytic lesion upper pole right kidney has attenuation too high to be a simple cyst but cannot be characterized on this study without intravenous contrast material. Lesion is stable in size comparing to the MRI of the 8 months ago. No left hydroureteronephrosis. The urinary bladder appears normal for the degree of distention. Stomach/Bowel: Gastrostomy tube is evident. Duodenum is normally positioned as is the ligament of Treitz. No small bowel wall thickening. No small bowel dilatation. Terminal ileum not well seen. The appendix is not visualized, but there is no edema or inflammation in the region of the cecum. No gross colonic mass. No colonic wall thickening. No substantial diverticular change.  Vascular/Lymphatic: There is abdominal aortic atherosclerosis without aneurysm. There is no gastrohepatic or hepatoduodenal ligament lymphadenopathy. No intraperitoneal or retroperitoneal lymphadenopathy. No pelvic sidewall lymphadenopathy. Reproductive: The prostate gland and seminal vesicles have normal imaging features. Other: No intraperitoneal free fluid. Musculoskeletal: In the midline, 3-4 cm cranial to the umbilicus, a track of gas and debris is identified in the subcutaneous fat of the anterior abdominal wall. This tracks deep into the rectus sheath (images 56-61 of series 2) and suggests abscess. This does appear to remain confined with in the rectus sheath and there is no adjacent bowel wall thickening or definite findings to suggest that this represents an enterocutaneous fistula. There is no adjacent loop of colon. Bone windows reveal no worrisome lytic or sclerotic osseous lesions. 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 Hemorrhage and edema are identified in the right axillary region and tracking down the right lateral chest wall, likely related to the proximal humerus fracture. IMPRESSION: 1. Tubular track of fluid and debris measuring about 2 cm in diameter tracks from this scan about 3-4 cm cranial to the umbilicus deep into the right paramidline rectus sheath. Anterior abdominal wall abscess could have this appearance. There is no underlying small bowel wall thickening to suggest enterocutaneous fistula. 2. No evidence for retroperitoneal  hematoma. 3. Edema/hemorrhage is identified in the right axillary region tracking down the right lateral chest wall, compatible with the acute proximal humerus fracture. Electronically Signed   By: Kennith Center M.D.   On: 10/04/2017 20:16   Dg Chest 2 View  Result Date: 10/02/2017 CLINICAL DATA:  Status post fall out of bed, with right shoulder pain and bruising. Initial encounter. EXAM: CHEST  2 VIEW COMPARISON:  Chest radiograph performed  01/30/2015 FINDINGS: There is stable elevation of the right hemidiaphragm. The lungs are well-aerated and clear. There is no evidence of focal opacification, pleural effusion or pneumothorax. The cardiomediastinal silhouette is within normal limits. No acute osseous abnormalities are seen. IMPRESSION: Stable elevation of the right hemidiaphragm. Lungs remain grossly clear. No displaced rib fractures identified. Electronically Signed   By: Roanna Raider M.D.   On: 10/02/2017 17:25   Dg Shoulder Right  Result Date: 10/02/2017 CLINICAL DATA:  51 year old male with history of trauma after falling out of bed yesterday evening complaining of pain in the right shoulder. EXAM: RIGHT SHOULDER - 2+ VIEW COMPARISON:  No priors. FINDINGS: Three views of the right shoulder demonstrate an acute displaced likely mildly impacted fracture of the surgical neck of the humerus, with approximately 8 mm of medial displacement of the distal fracture fragment. There may be some mild comminution of the fracture. The humeral head appears rotated in the glenoid fossa, but remains located. Other visualized bones appear intact. IMPRESSION: 1. Acute minimally displaced, likely impacted, potentially mildly comminuted fracture of the surgical neck of the right humerus, as above. Electronically Signed   By: Trudie Reed M.D.   On: 10/02/2017 17:29   Ct Head Wo Contrast  Result Date: 10/02/2017 CLINICAL DATA:  Patient fell out of bed last evening. Altered mentation and slow verbal communication. EXAM: CT HEAD WITHOUT CONTRAST CT CERVICAL SPINE WITHOUT CONTRAST TECHNIQUE: Multidetector CT imaging of the head and cervical spine was performed following the standard protocol without intravenous contrast. Multiplanar CT image reconstructions of the cervical spine were also generated. COMPARISON:  06/07/2017 FINDINGS: CT HEAD FINDINGS Brain: Mild superficial and central atrophy with chronic appearing small vessel ischemic disease. No acute  intracranial hemorrhage, midline shift or edema. No large vascular territory infarct. No intracranial hemorrhage, intra-axial mass nor extra-axial fluid collection. No midline shift or mass effect. No hydrocephalus. Vascular: No hyperdense vessel or unexpected calcification. Skull: Normal. Negative for fracture or focal lesion. Sinuses/Orbits: Minimal ethmoid and right maxillary sinus mucosal thickening. Clear mastoids. Intact orbits and globes. Other: None CT CERVICAL SPINE FINDINGS Alignment: Slight reversal cervical lordosis attributable to multilevel degenerative disc disease. Intact craniocervical relationship and atlantodental interval. Skull base and vertebrae: Skull base appears intact. Normal C1-C2 relationship. Intact dense. No acute fracture or suspicious osseous lesions. Soft tissues and spinal canal: No prevertebral fluid or swelling. No visible canal hematoma. Disc levels: Multilevel degenerative disc disease mild to moderate at C2-3 and C3-4 and moderate-to-marked from Celsius 4 through C7. No jumped or perched facets. Mild right side foraminal narrowing at C4-5, C5-6 and C6-7 due to uncovertebral joint and endplate degenerative spurring Upper chest: Negative. Other: None IMPRESSION: 1. No acute intracranial abnormality. 2. Stable atrophy with chronic minimal small vessel ischemic disease. 3. Chronic stable cervical spondylosis without acute cervical spine fracture or posttraumatic subluxation. Electronically Signed   By: Tollie Eth M.D.   On: 10/02/2017 17:05   Ct Cervical Spine Wo Contrast  Result Date: 10/02/2017 CLINICAL DATA:  Patient fell out of bed last evening. Altered  mentation and slow verbal communication. EXAM: CT HEAD WITHOUT CONTRAST CT CERVICAL SPINE WITHOUT CONTRAST TECHNIQUE: Multidetector CT imaging of the head and cervical spine was performed following the standard protocol without intravenous contrast. Multiplanar CT image reconstructions of the cervical spine were also  generated. COMPARISON:  06/07/2017 FINDINGS: CT HEAD FINDINGS Brain: Mild superficial and central atrophy with chronic appearing small vessel ischemic disease. No acute intracranial hemorrhage, midline shift or edema. No large vascular territory infarct. No intracranial hemorrhage, intra-axial mass nor extra-axial fluid collection. No midline shift or mass effect. No hydrocephalus. Vascular: No hyperdense vessel or unexpected calcification. Skull: Normal. Negative for fracture or focal lesion. Sinuses/Orbits: Minimal ethmoid and right maxillary sinus mucosal thickening. Clear mastoids. Intact orbits and globes. Other: None CT CERVICAL SPINE FINDINGS Alignment: Slight reversal cervical lordosis attributable to multilevel degenerative disc disease. Intact craniocervical relationship and atlantodental interval. Skull base and vertebrae: Skull base appears intact. Normal C1-C2 relationship. Intact dense. No acute fracture or suspicious osseous lesions. Soft tissues and spinal canal: No prevertebral fluid or swelling. No visible canal hematoma. Disc levels: Multilevel degenerative disc disease mild to moderate at C2-3 and C3-4 and moderate-to-marked from Celsius 4 through C7. No jumped or perched facets. Mild right side foraminal narrowing at C4-5, C5-6 and C6-7 due to uncovertebral joint and endplate degenerative spurring Upper chest: Negative. Other: None IMPRESSION: 1. No acute intracranial abnormality. 2. Stable atrophy with chronic minimal small vessel ischemic disease. 3. Chronic stable cervical spondylosis without acute cervical spine fracture or posttraumatic subluxation. Electronically Signed   By: Tollie Eth M.D.   On: 10/02/2017 17:05   US Renal  Result Date: 10/04/2017 CLINICAL DATA:  Flank pain, urinary tract infection, acute renal insufficiency. History of previous right nephrectomy. EXAM: RENAL / URINARY TRACT ULTRASOUND COMPLETE COMPARISON:  Abdominal MRI of February 26 FINDINGS: Right Kidney: The  right kidney is surgically absent. Left Kidney: Length: 13.3 cm. Echogenicity within normal limits. No mass or hydronephrosis visualized. Bladder: Appears normal for degree of bladder distention. IMPRESSION: Normal appearance of the left kidney and urinary bladder. Previous right nephrectomy. Electronically Signed   By: David  [place] M.D.   On: 10/04/2017 15:31   Dg Chest Port 1 View  Result Date: 10/03/2017 CLINICAL DATA:  51 year old male status post central line placement. EXAM: PORTABLE CHEST 1 VIEW COMPARISON:  Chest x-ray 10/02/2017. FINDINGS: There is a left-sided subclavian central venous catheter with tip terminating in the superior cavoatrial junction. Study is limited by lack of visualization of the left lung base. With this limitation in mind, lung volumes are very low. Elevation of the right hemidiaphragm is unchanged. No acute consolidative airspace disease. No definite pleural effusions (although a small left pleural effusion cannot be excluded secondary to the limitations of today's study). No evidence of pulmonary edema. Heart size is normal. Mediastinal contours are unremarkable. No definite pneumothorax. IMPRESSION: 1. New left subclavian central venous catheter with tip at the superior cavoatrial junction. 2. Persistent low lung volumes. Electronically Signed   By: Trudie Reed M.D.   On: 10/03/2017 13:17   Dg Humerus Right  Result Date: 10/02/2017 CLINICAL DATA:  51 year old male with history of trauma after falling out of bed yesterday evening. Pain in the right shoulder. EXAM: RIGHT HUMERUS - 2+ VIEW COMPARISON:  No priors. FINDINGS: Three views of the right shoulder demonstrate an acute displaced likely mildly impacted fracture of the surgical neck of the humerus, with approximately 8 mm of medial displacement of the distal fracture fragment. There may be  some mild comminution of the fracture. The humeral head appears rotated in the glenoid fossa, but remains located. Distal  aspect of the humerus is otherwise intact. IMPRESSION: 1. Acute mildly displaced, likely impacted, potentially mildly comminuted fracture of the surgical neck of the left humerus, as above. Electronically Signed   By: Trudie Reed M.D.   On: 10/02/2017 17:28     Subjective: Without complaints  Discharge Exam: Vitals:   10/06/17 1956 10/07/17 0417  BP: 109/64 94/68  Pulse: 78 61  Resp: 20 (!) 22  Temp: 98.4 F (36.9 C) 97.7 F (36.5 C)  SpO2: 100% 98%   Vitals:   10/06/17 1345 10/06/17 1802 10/06/17 1956 10/07/17 0417  BP: 112/65  109/64 94/68  Pulse: 87  78 61  Resp: 20  20 (!) 22  Temp: 98.5 F (36.9 C)  98.4 F (36.9 C) 97.7 F (36.5 C)  TempSrc:   Oral Oral  SpO2: 100%  100% 98%  Weight:  118.7 kg (261 lb 11.2 oz)  119.9 kg (264 lb 6.4 oz)  Height:        General: Pt is alert, awake, not in acute distress Cardiovascular: RRR, S1/S2 +, no rubs, no gallops Respiratory: CTA bilaterally, no wheezing, no rhonchi Abdominal: Soft, NT, ND, bowel sounds + Extremities: no edema, no cyanosis   The results of significant diagnostics from this hospitalization (including imaging, microbiology, ancillary and laboratory) are listed below for reference.     Microbiology: Recent Results (from the past 240 hour(s))  Urine culture     Status: Abnormal   Collection Time: 10/02/17 11:25 PM  Result Value Ref Range Status   Specimen Description URINE, CATHETERIZED  Final   Special Requests NONE  Final   Culture >=100,000 COLONIES/mL ESCHERICHIA COLI (A)  Final   Report Status 10/05/2017 FINAL  Final   Organism ID, Bacteria ESCHERICHIA COLI (A)  Final      Susceptibility   Escherichia coli - MIC*    AMPICILLIN >=32 RESISTANT Resistant     CEFAZOLIN 16 SENSITIVE Sensitive     CEFTRIAXONE <=1 SENSITIVE Sensitive     CIPROFLOXACIN 0.5 SENSITIVE Sensitive     GENTAMICIN <=1 SENSITIVE Sensitive     IMIPENEM <=0.25 SENSITIVE Sensitive     NITROFURANTOIN <=16 SENSITIVE Sensitive      TRIMETH/SULFA >=320 RESISTANT Resistant     AMPICILLIN/SULBACTAM >=32 RESISTANT Resistant     PIP/TAZO 8 SENSITIVE Sensitive     Extended ESBL NEGATIVE Sensitive     * >=100,000 COLONIES/mL ESCHERICHIA COLI  MRSA PCR Screening     Status: None   Collection Time: 10/03/17  3:19 AM  Result Value Ref Range Status   MRSA by PCR NEGATIVE NEGATIVE Final    Comment:        The GeneXpert MRSA Assay (FDA approved for NASAL specimens only), is one component of a comprehensive MRSA colonization surveillance program. It is not intended to diagnose MRSA infection nor to guide or monitor treatment for MRSA infections.   Culture, blood (routine x 2)     Status: None (Preliminary result)   Collection Time: 10/04/17  1:10 PM  Result Value Ref Range Status   Specimen Description BLOOD RIGHT HAND  Final   Special Requests IN PEDIATRIC BOTTLE Blood Culture adequate volume  Final   Culture   Final    NO GROWTH 2 DAYS Performed at [facility], [address] [place], [place] [address]    Report Status PENDING  Incomplete  Culture,  blood (routine x 2)     Status: None (Preliminary result)   Collection Time: 10/04/17  1:23 PM  Result Value Ref Range Status   Specimen Description BLOOD RIGHT HAND  Final   Special Requests IN PEDIATRIC BOTTLE Blood Culture adequate volume  Final   Culture   Final    NO GROWTH 2 DAYS Performed at [facility], [address], [place], [place] [address]    Report Status PENDING  Incomplete     Labs: BNP (last 3 results) No results for input(s): BNP in the last 8760 hours. Basic Metabolic Panel:  Recent Labs Lab 10/02/17 1622 10/03/17 0627 10/03/17 0854 10/03/17 1700 10/04/17 0435 10/04/17 1958 10/05/17 0448  NA 139 141  --   --  142  --  139  K 4.6 4.3  --   --  3.7  --  4.1  CL 103 106  --   --  109  --  107  CO2 25 24  --   --  26  --  25  GLUCOSE 94 85  --   --  87  --  94  BUN 17 18  --   --  18  --  12  CREATININE 1.53* 1.33*   --   --  1.01  --  0.71  CALCIUM 8.9 8.6*  --   --  7.8*  --  8.0*  MG  --   --  1.6* 1.6* 1.7 2.1  --   PHOS  --   --   --  3.2 3.3 3.6  --    Liver Function Tests:  Recent Labs Lab 10/02/17 1622 10/04/17 0435 10/05/17 0448  AST 20 13* 12*  ALT 7* 8* 7*  ALKPHOS 62 39 42  BILITOT 0.7 0.7 0.7  PROT 6.9 5.2* 5.5*  ALBUMIN 3.5 2.6* 2.5*   No results for input(s): LIPASE, AMYLASE in the last 168 hours. No results for input(s): AMMONIA in the last 168 hours. CBC:  Recent Labs Lab 10/02/17 1622 10/03/17 0627 10/04/17 0435 10/05/17 0448  WBC 12.2* 8.2 4.8 5.5  NEUTROABS 8.6*  --   --   --   HGB 11.8* 10.3* 7.4* 8.5*  HCT 35.9* 31.5* 22.3* 25.1*  MCV 85.7 86.1 86.1 86.9  PLT 118* 122* 80* 82*   Cardiac Enzymes:  Recent Labs Lab 10/04/17 1257 10/04/17 1958 10/05/17 0448  TROPONINI <0.03 <0.03 <0.03   BNP: Invalid input(s): POCBNP CBG:  Recent Labs Lab 10/06/17 1959 10/06/17 2341 10/07/17 0415 10/07/17 0715 10/07/17 1131  GLUCAP 85 92 96 85 100*   D-Dimer No results for input(s): DDIMER in the last 72 hours. Hgb A1c No results for input(s): HGBA1C in the last 72 hours. Lipid Profile No results for input(s): CHOL, HDL, LDLCALC, TRIG, CHOLHDL, LDLDIRECT in the last 72 hours. Thyroid function studies No results for input(s): TSH, T4TOTAL, T3FREE, THYROIDAB in the last 72 hours.  Invalid input(s): FREET3 Anemia work up No results for input(s): VITAMINB12, FOLATE, FERRITIN, TIBC, IRON, RETICCTPCT in the last 72 hours. Urinalysis    Component Value Date/Time   COLORURINE AMBER (A) 10/02/2017 2325   APPEARANCEUR CLOUDY (A) 10/02/2017 2325   LABSPEC 1.020 10/02/2017 2325   PHURINE 5.0 10/02/2017 2325   GLUCOSEU NEGATIVE 10/02/2017 2325   HGBUR SMALL (A) 10/02/2017 2325   BILIRUBINUR NEGATIVE 10/02/2017 2325   KETONESUR 5 (A) 10/02/2017 2325   PROTEINUR 100 (A) 10/02/2017 2325   UROBILINOGEN 0.2 01/30/2015 1059   NITRITE  POSITIVE (A) 10/02/2017 2325    LEUKOCYTESUR LARGE (A) 10/02/2017 2325   Sepsis Labs Invalid input(s): PROCALCITONIN,  WBC,  LACTICIDVEN Microbiology Recent Results (from the past 240 hour(s))  Urine culture     Status: Abnormal   Collection Time: 10/02/17 11:25 PM  Result Value Ref Range Status   Specimen Description URINE, CATHETERIZED  Final   Special Requests NONE  Final   Culture >=100,000 COLONIES/mL ESCHERICHIA COLI (A)  Final   Report Status 10/05/2017 FINAL  Final   Organism ID, Bacteria ESCHERICHIA COLI (A)  Final      Susceptibility   Escherichia coli - MIC*    AMPICILLIN >=32 RESISTANT Resistant     CEFAZOLIN 16 SENSITIVE Sensitive     CEFTRIAXONE <=1 SENSITIVE Sensitive     CIPROFLOXACIN 0.5 SENSITIVE Sensitive     GENTAMICIN <=1 SENSITIVE Sensitive     IMIPENEM <=0.25 SENSITIVE Sensitive     NITROFURANTOIN <=16 SENSITIVE Sensitive     TRIMETH/SULFA >=320 RESISTANT Resistant     AMPICILLIN/SULBACTAM >=32 RESISTANT Resistant     PIP/TAZO 8 SENSITIVE Sensitive     Extended ESBL NEGATIVE Sensitive     * >=100,000 COLONIES/mL ESCHERICHIA COLI  MRSA PCR Screening     Status: None   Collection Time: 10/03/17  3:19 AM  Result Value Ref Range Status   MRSA by PCR NEGATIVE NEGATIVE Final    Comment:        The GeneXpert MRSA Assay (FDA approved for NASAL specimens only), is one component of a comprehensive MRSA colonization surveillance program. It is not intended to diagnose MRSA infection nor to guide or monitor treatment for MRSA infections.   Culture, blood (routine x 2)     Status: None (Preliminary result)   Collection Time: 10/04/17  1:10 PM  Result Value Ref Range Status   Specimen Description BLOOD RIGHT HAND  Final   Special Requests IN PEDIATRIC BOTTLE Blood Culture adequate volume  Final   Culture   Final    NO GROWTH 2 DAYS Performed at [facility], [address] [place], [place] [address]    Report Status PENDING  Incomplete  Culture, blood (routine x 2)     Status:  None (Preliminary result)   Collection Time: 10/04/17  1:23 PM  Result Value Ref Range Status   Specimen Description BLOOD RIGHT HAND  Final   Special Requests IN PEDIATRIC BOTTLE Blood Culture adequate volume  Final   Culture   Final    NO GROWTH 2 DAYS Performed at [facility], [address], [place], [place] [address]    Report Status PENDING  Incomplete     SIGNED:   Zyann Mabry, Scheryl Marten, MD  [facility] 10/07/2017, 1:46 PM  If 7PM-7AM, please contact night-coverage [email or web address] Password [email or web address]

## 2017-10-07 NOTE — Progress Notes (Signed)
PT Note  Patient Details Name: [name] MRN: [id number] DOB: 17-Jul-1966   Noted pt ready for DC back to SNF today, will defer any PT needs for SNF to assess. Thanks  Marella Bile 10/07/2017, 4:10 PM

## 2017-10-08 LAB — TYPE AND SCREEN
ABO/RH(D): O POS
Antibody Screen: POSITIVE
DAT, IGG: NEGATIVE
DONOR AG TYPE: NEGATIVE
Donor AG Type: NEGATIVE
UNIT DIVISION: 0
Unit division: 0

## 2017-10-08 LAB — BPAM RBC
BLOOD PRODUCT EXPIRATION DATE: 201811072359
Blood Product Expiration Date: 201811052359
ISSUE DATE / TIME: 201810082244
Unit Type and Rh: 5100
Unit Type and Rh: 5100

## 2017-10-09 LAB — CULTURE, BLOOD (ROUTINE X 2)
CULTURE: NO GROWTH
Culture: NO GROWTH
SPECIAL REQUESTS: ADEQUATE
SPECIAL REQUESTS: ADEQUATE

## 2017-11-04 IMAGING — CR DG CHEST 2V
3 series · 3 of 3 positions shown · non-contrast
Comparison: Chest radiograph performed 01/30/2015

CLINICAL DATA: Status post fall out of bed, with right shoulder
pain and bruising. Initial encounter.

EXAM:
CHEST  2 VIEW

[w chest lat (1 of 2)]
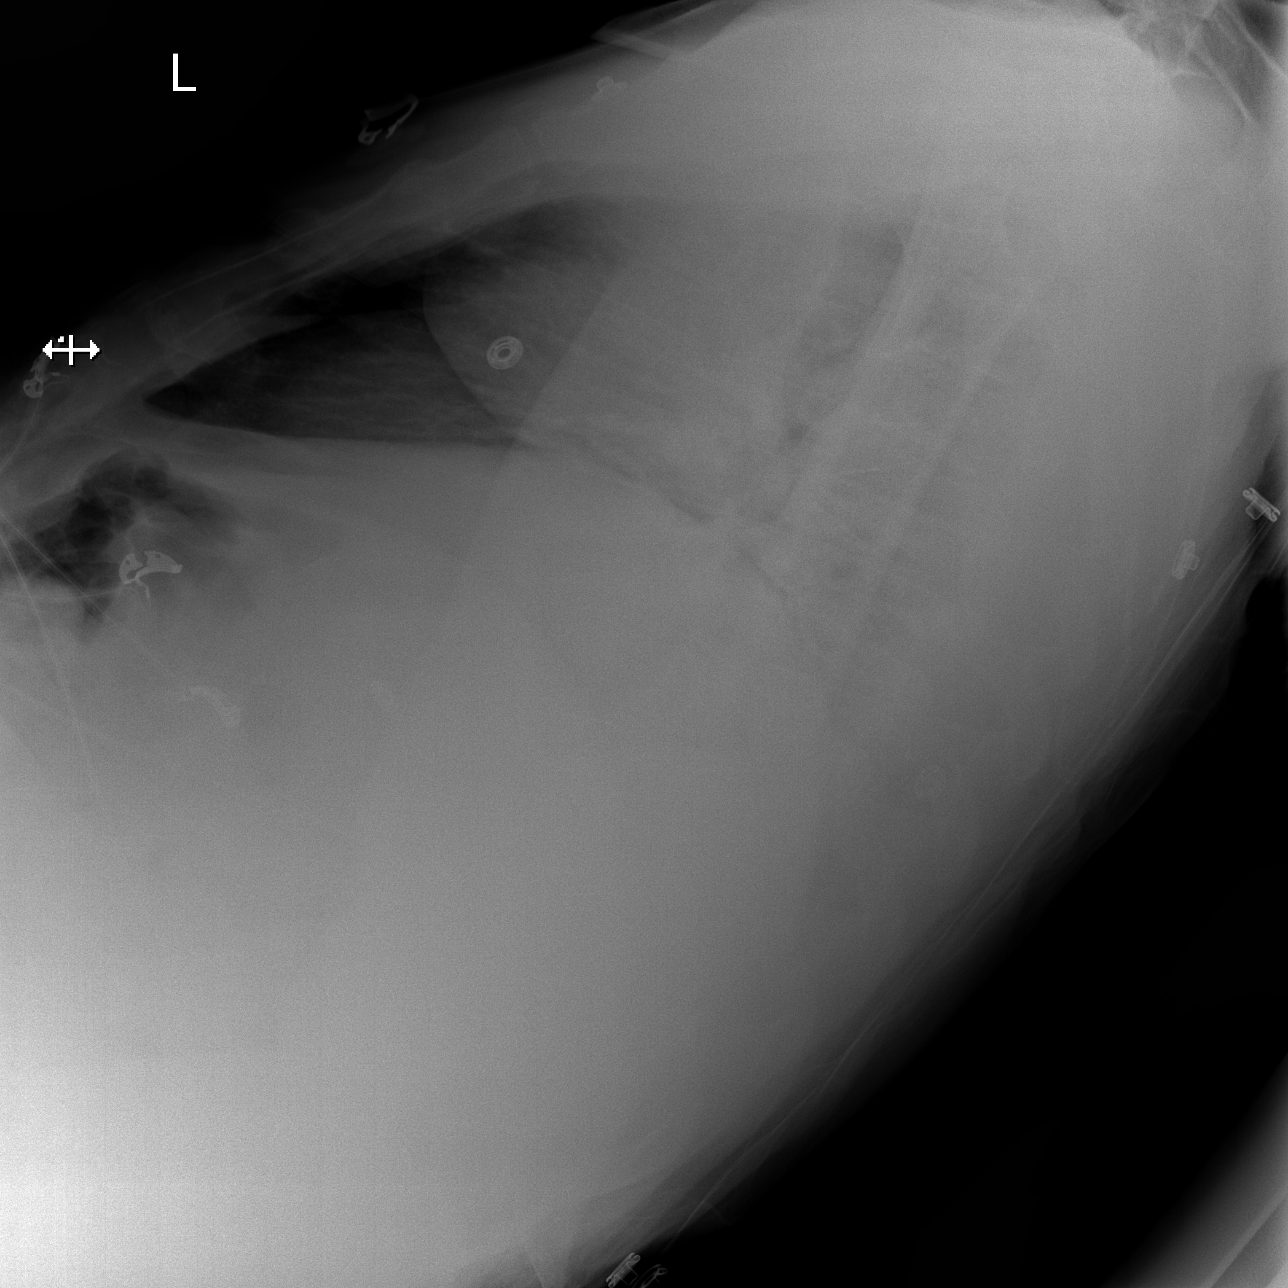

[w chest lat (2 of 2)]
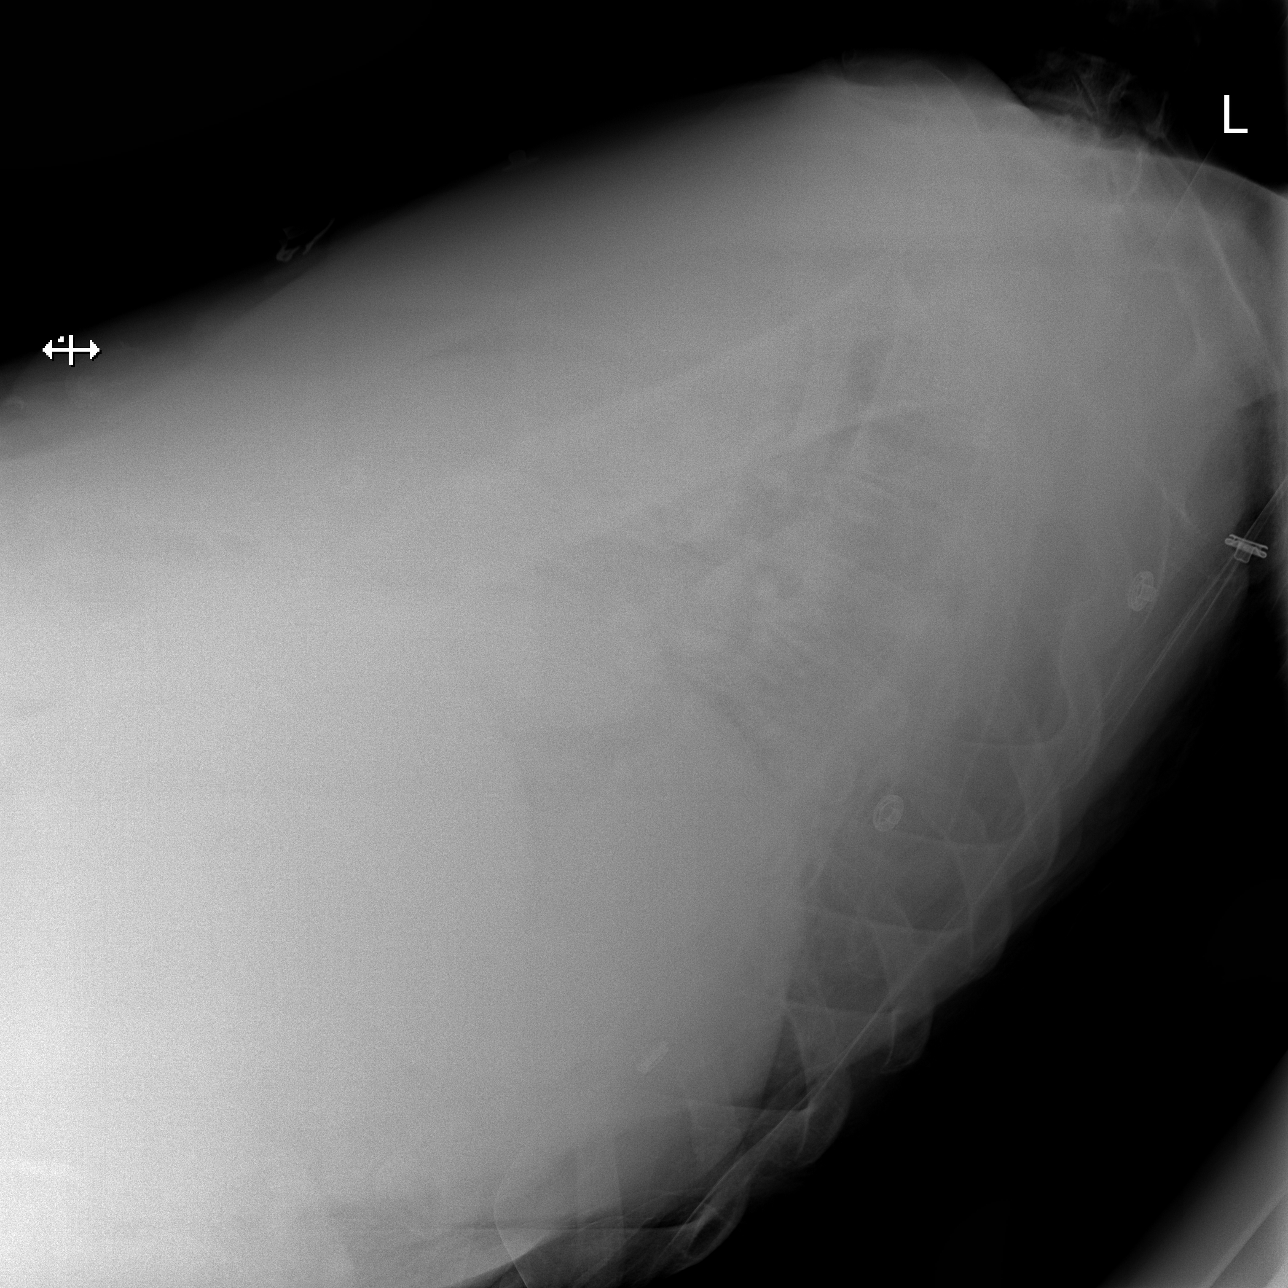

[x chest ap]
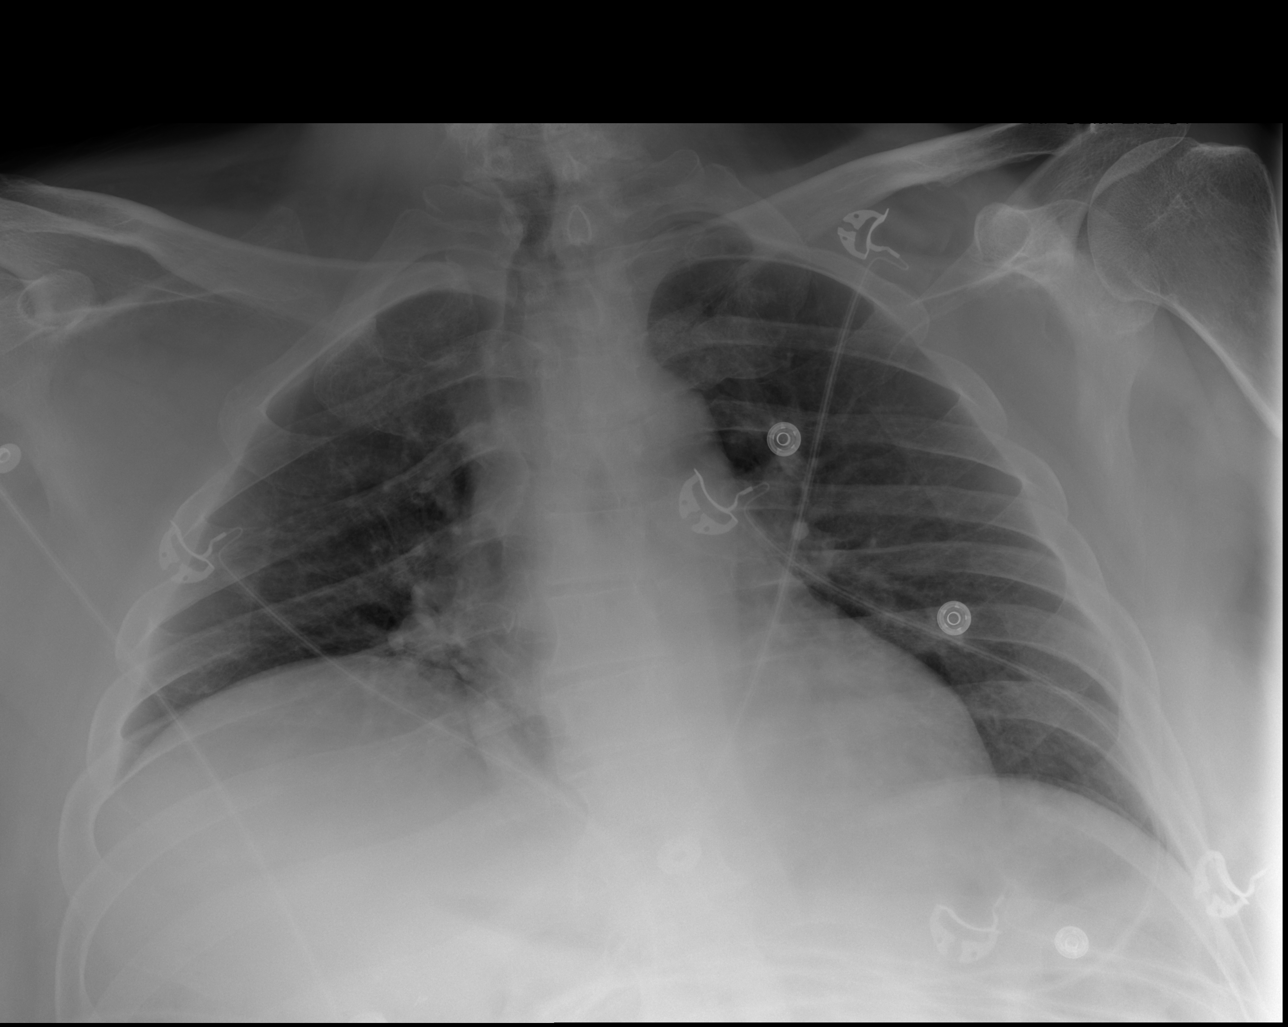

[3 of 3 positions shown; findings below may reference images not displayed]

FINDINGS: There is stable elevation of the right hemidiaphragm. The lungs are
well-aerated and clear. There is no evidence of focal opacification,
pleural effusion or pneumothorax.

The cardiomediastinal silhouette is within normal limits. No acute
osseous abnormalities are seen.
IMPRESSION: Stable elevation of the right hemidiaphragm. Lungs remain grossly
clear. No displaced rib fractures identified.

## 2017-11-04 IMAGING — CR DG SHOULDER 2+V*R*
3 series · 3 of 3 positions shown · non-contrast
Comparison: No priors.

CLINICAL DATA: 50-year-old male with history of trauma after
falling out of bed yesterday evening complaining of pain in the
right shoulder.

EXAM:
RIGHT SHOULDER - 2+ VIEW

[x shoulder ap right (1 of 3)]
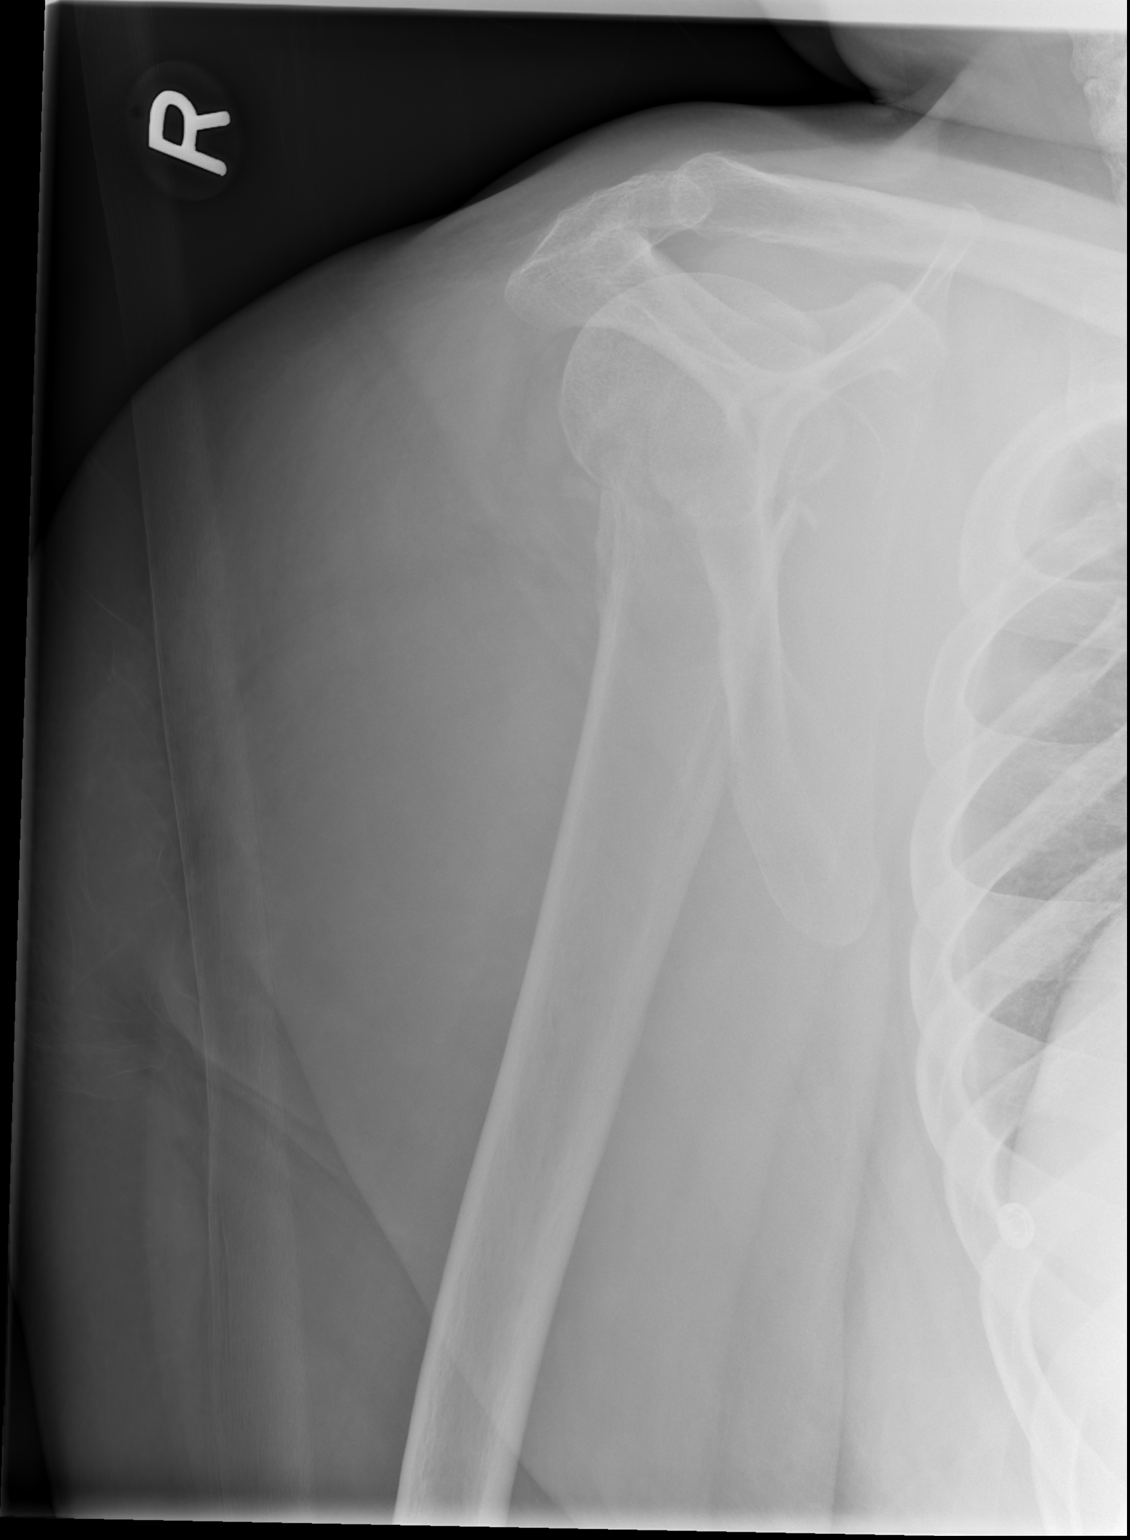

[x shoulder ap right (2 of 3)]
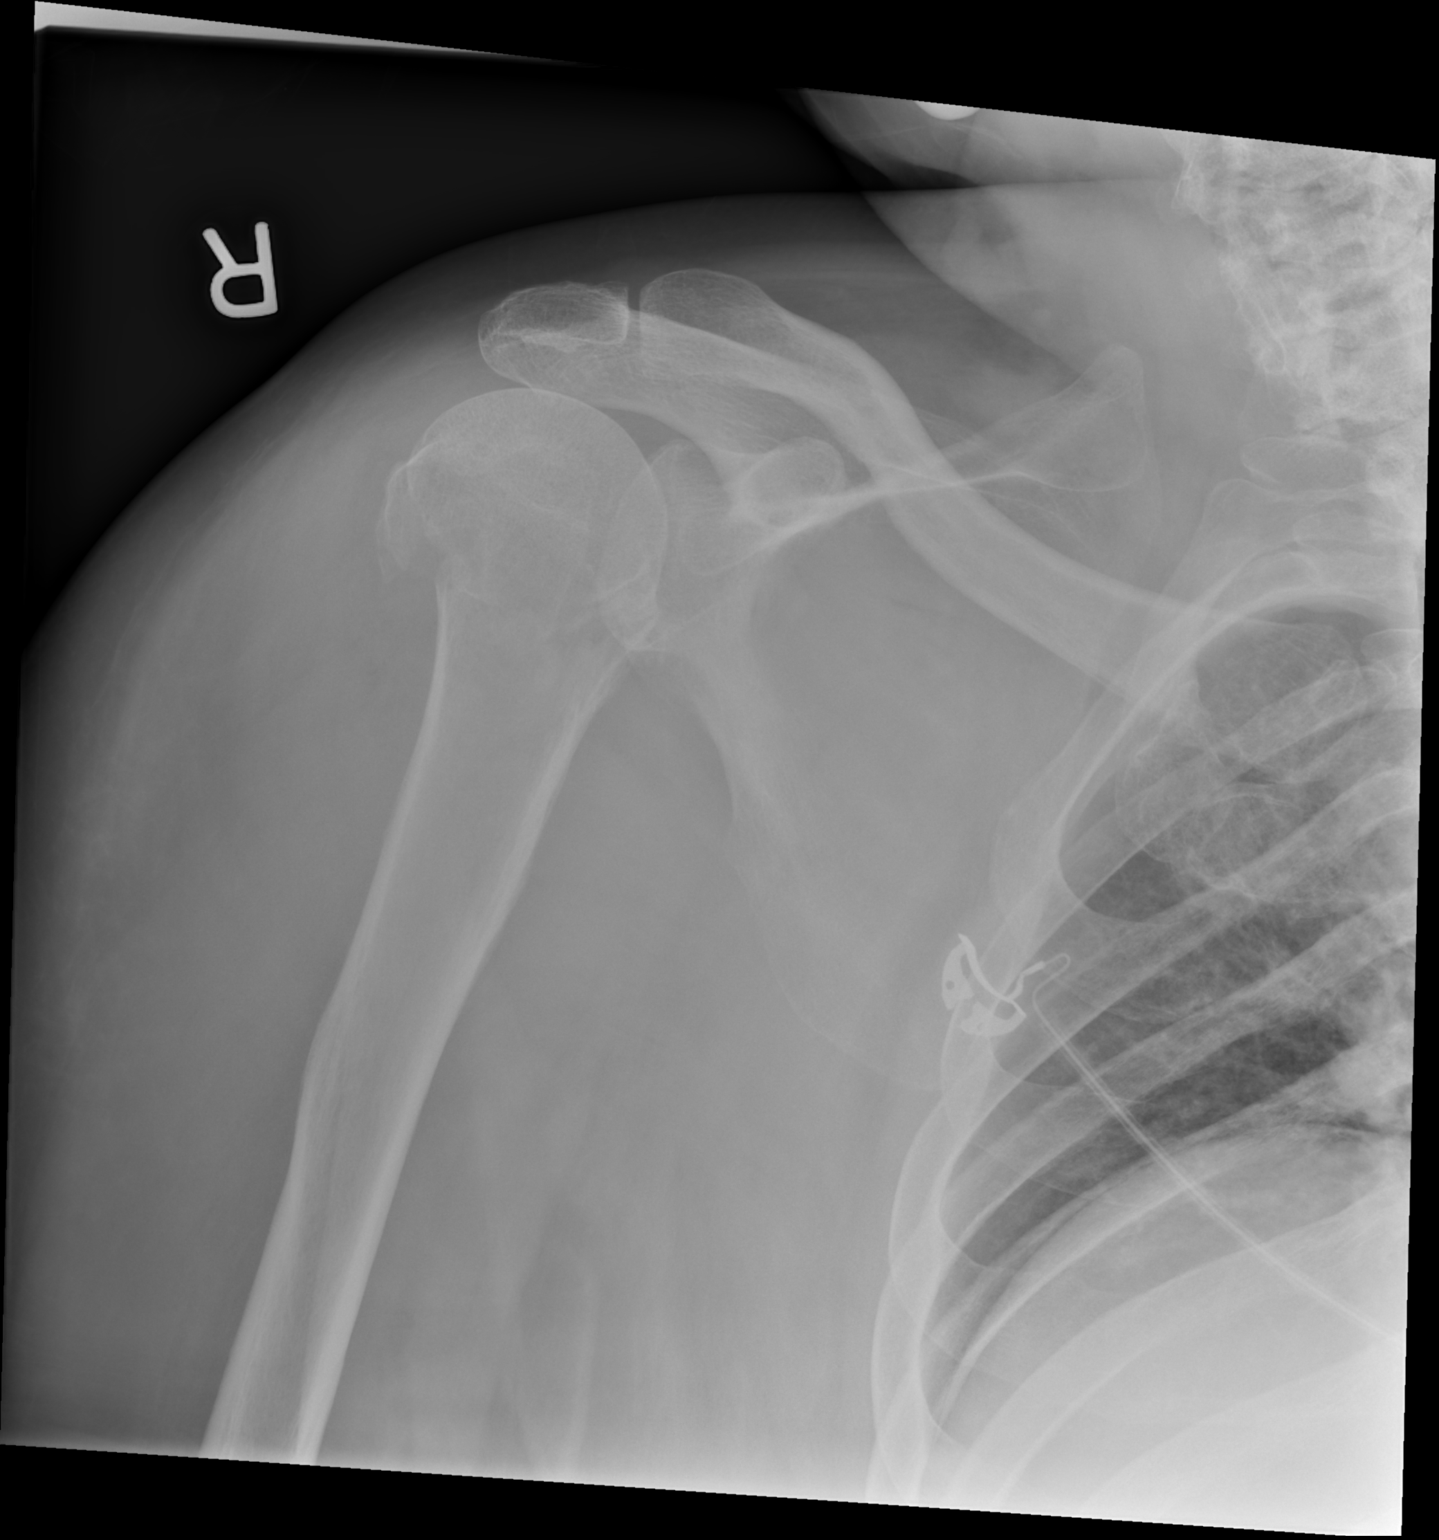

[x shoulder ap right (3 of 3)]
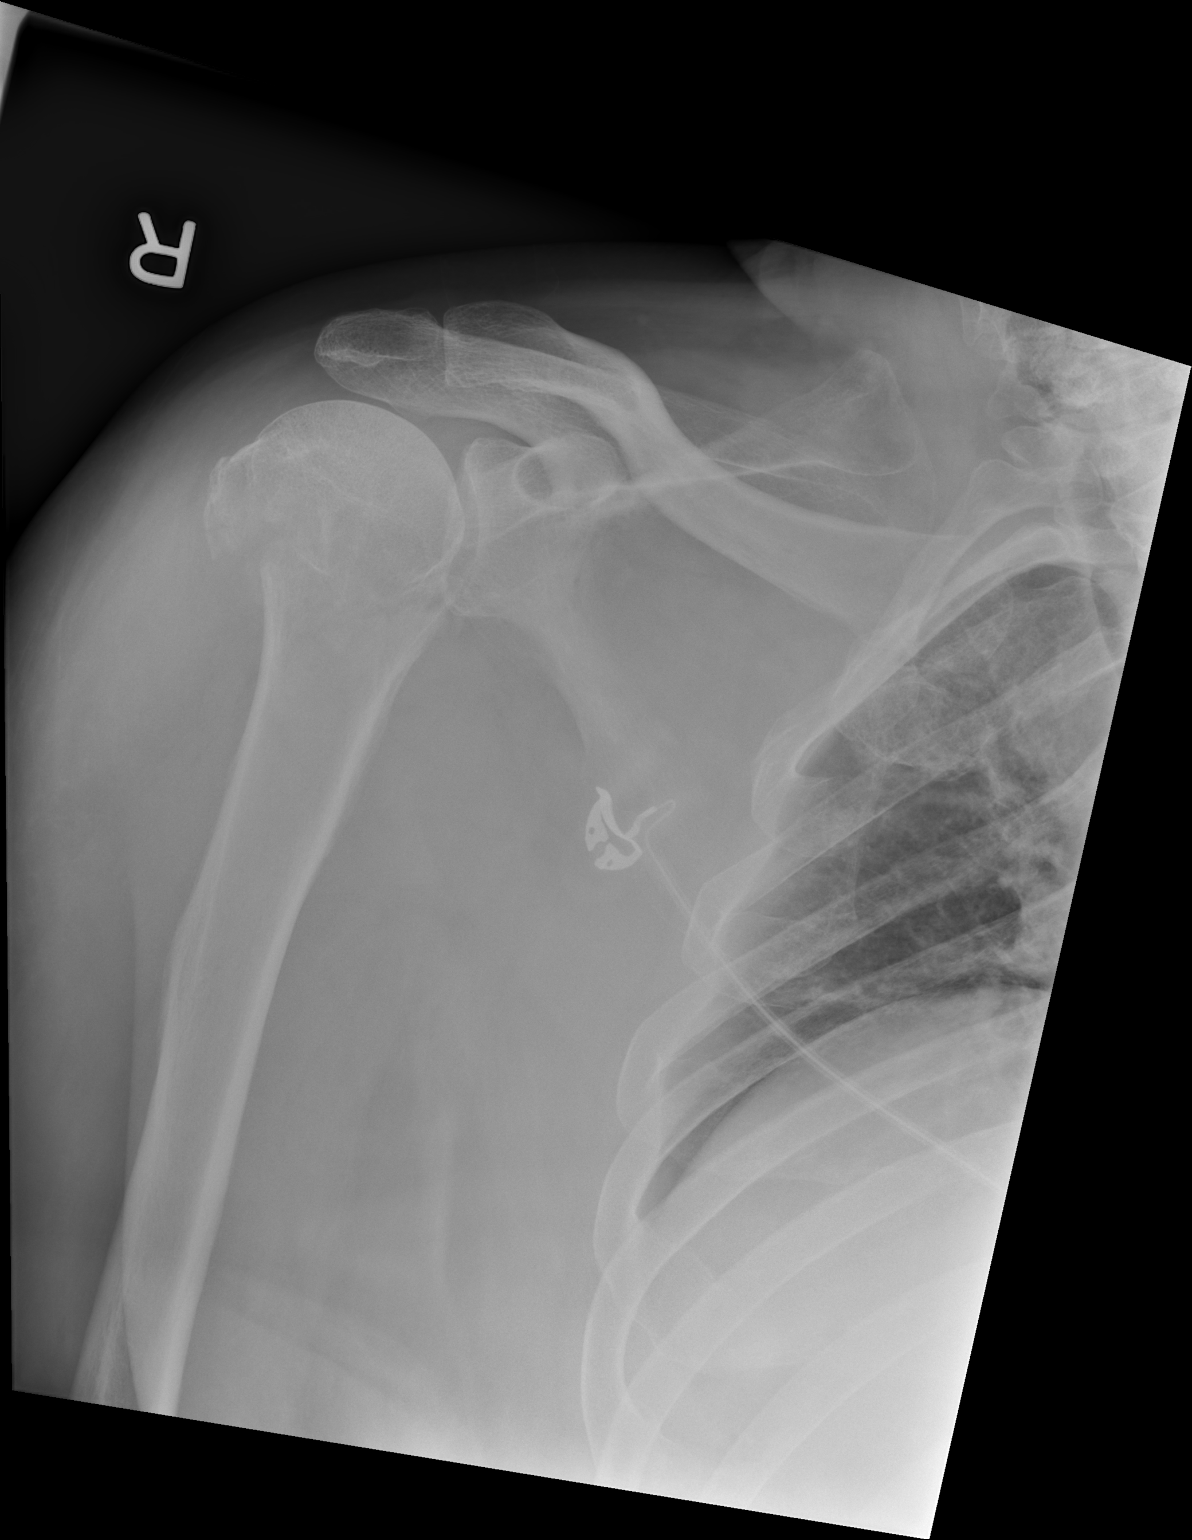

[3 of 3 positions shown; findings below may reference images not displayed]

FINDINGS: Three views of the right shoulder demonstrate an acute displaced
likely mildly impacted fracture of the surgical neck of the humerus,
with approximately 8 mm of medial displacement of the distal
fracture fragment. There may be some mild comminution of the
fracture. The humeral head appears rotated in the glenoid fossa, but
remains located. Other visualized bones appear intact.
IMPRESSION: 1. Acute minimally displaced, likely impacted, potentially mildly
comminuted fracture of the surgical neck of the right humerus, as
above.

## 2017-11-04 IMAGING — CT CT HEAD W/O CM
3 of 8 series · 14 of 47 positions shown, 17 images · non-contrast
Comparison: 06/07/2017

CLINICAL DATA: Patient fell out of bed last evening. Altered
mentation and slow verbal communication.

EXAM:
CT HEAD WITHOUT CONTRAST
CT CERVICAL SPINE WITHOUT CONTRAST
TECHNIQUE: Multidetector CT imaging of the head and cervical spine was
performed following the standard protocol without intravenous
contrast. Multiplanar CT image reconstructions of the cervical spine
were also generated.

[Series 5: coronal · coronal · 0.33mm/px · 3 of 74 slices shown]
[im 21/74  brain]
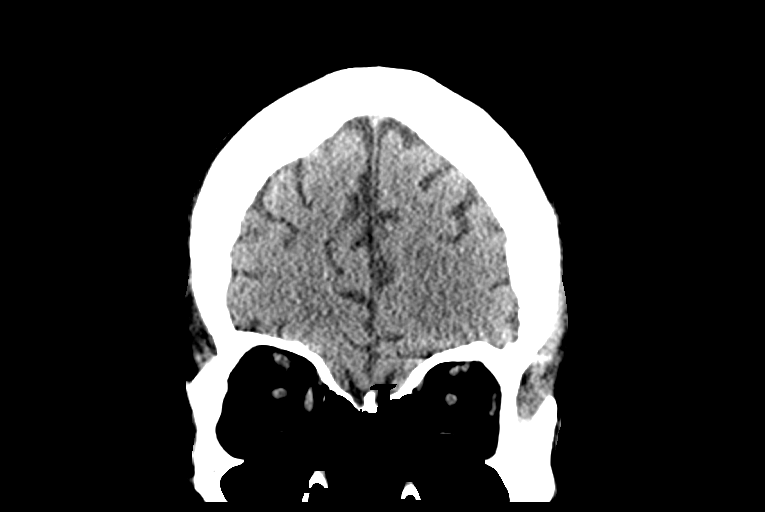
[im 32/74  brain]
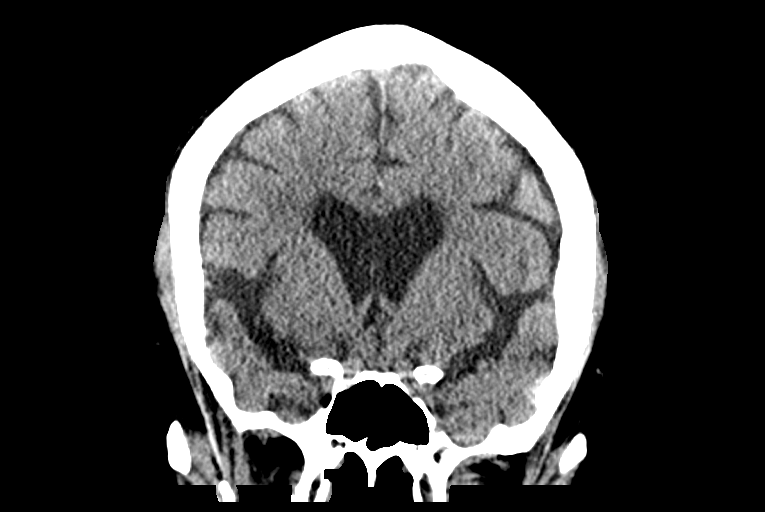
[im 42/74  brain]
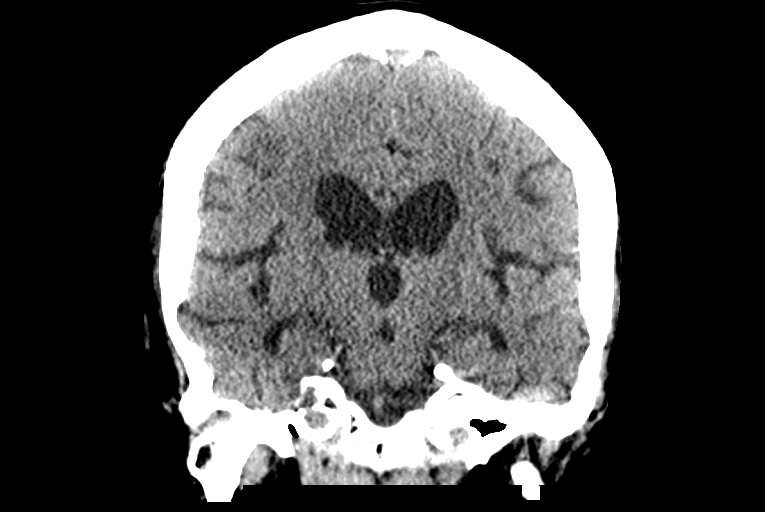

[Series 10: axial recon · axial · 0.23mm/px · z∈[-376,-230]mm · 9 of 101 slices shown, 12 images]
[im 10/101  brain]
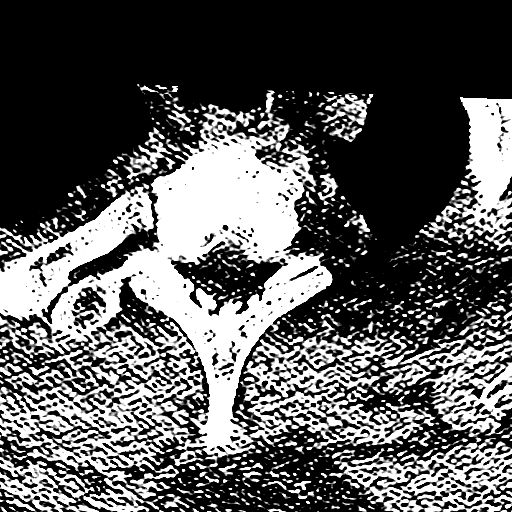
[im 10/101  bone]
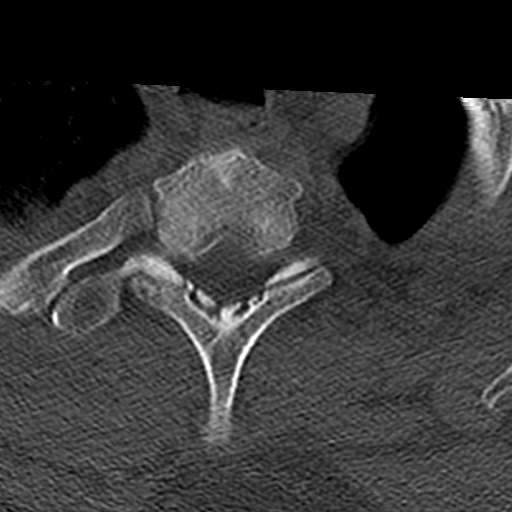
[im 19/101  brain]
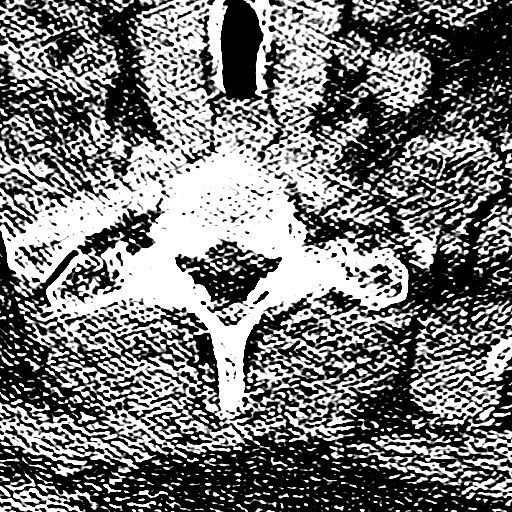
[im 28/101  brain]
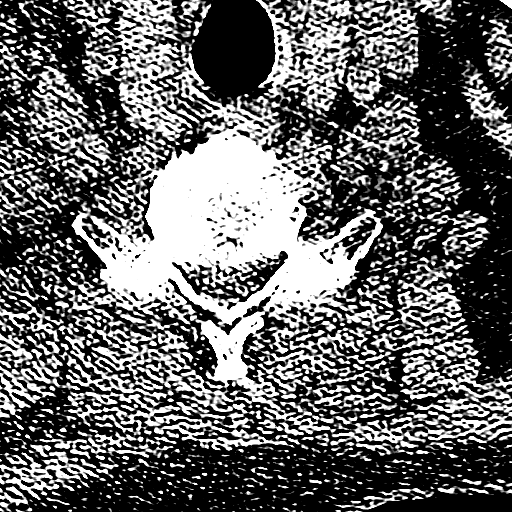
[im 37/101  brain]
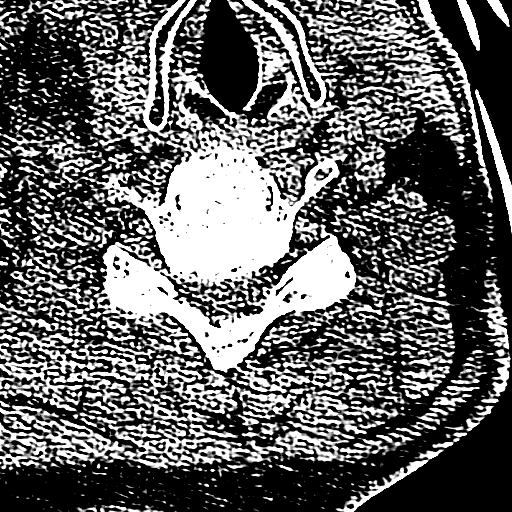
[im 55/101  brain]
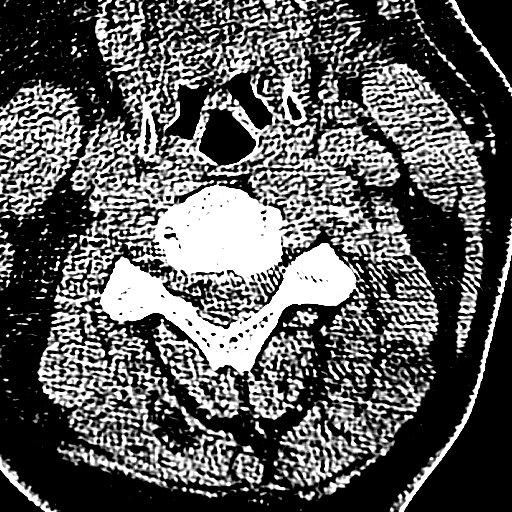
[im 55/101  bone]
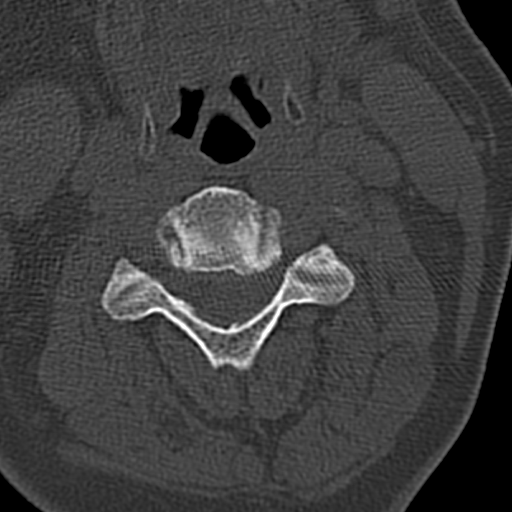
[im 64/101  brain]
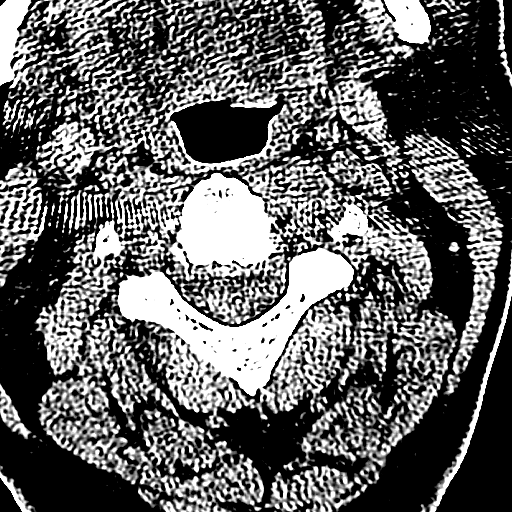
[im 73/101  brain]
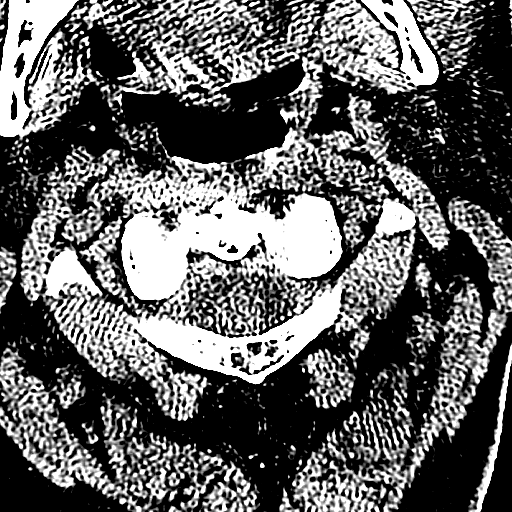
[im 82/101  brain]
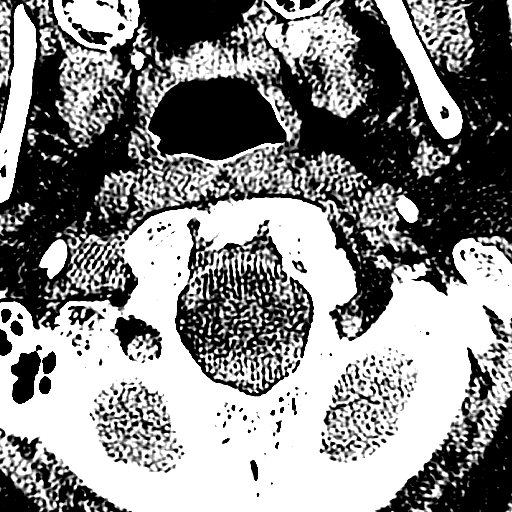
[im 91/101  brain]
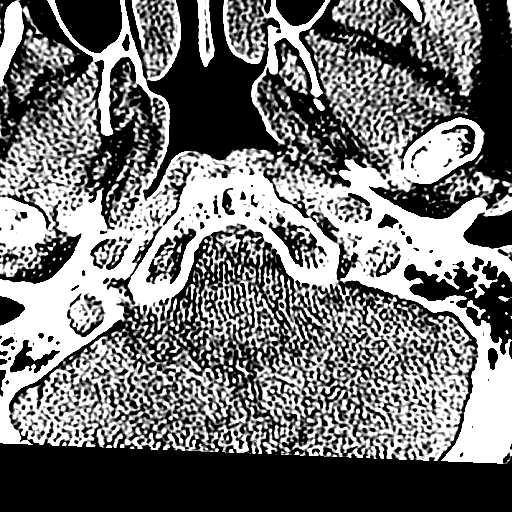
[im 91/101  bone]
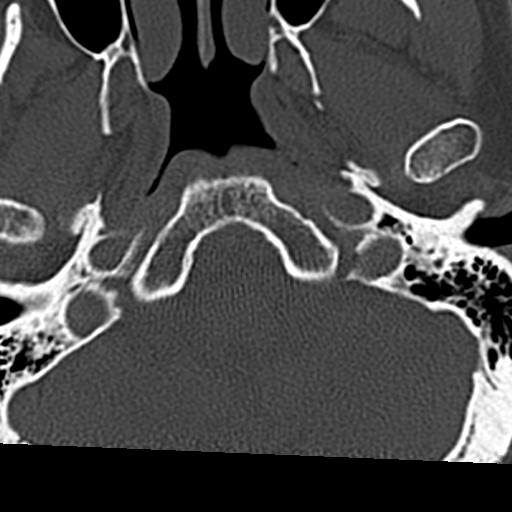

[Series 12: sagittal · sagittal · 0.35mm/px · 2 of 61 slices shown]
[im 21/61  brain]
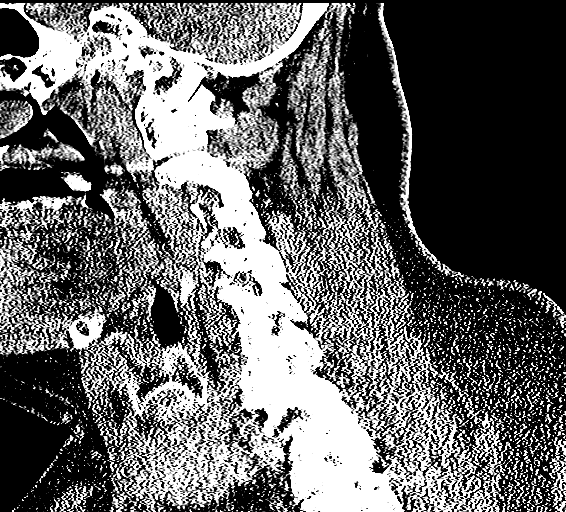
[im 41/61  brain]
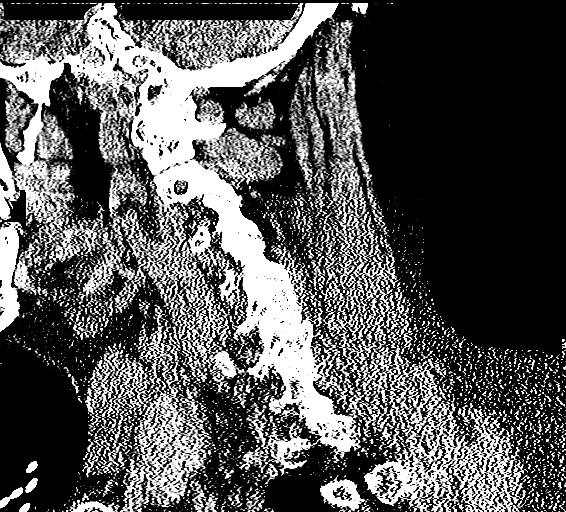

[14 of 47 positions shown; findings below may reference images not displayed]

FINDINGS: CT HEAD FINDINGS

Brain: Mild superficial and central atrophy with chronic appearing
small vessel ischemic disease. No acute intracranial hemorrhage,
midline shift or edema. No large vascular territory infarct. No
intracranial hemorrhage, intra-axial mass nor extra-axial fluid
collection. No midline shift or mass effect. No hydrocephalus.

Vascular: No hyperdense vessel or unexpected calcification.

Skull: Normal. Negative for fracture or focal lesion.

Sinuses/Orbits: Minimal ethmoid and right maxillary sinus mucosal
thickening. Clear mastoids. Intact orbits and globes.

Other: None

CT CERVICAL SPINE FINDINGS

Alignment: Slight reversal cervical lordosis attributable to
multilevel degenerative disc disease. Intact craniocervical
relationship and atlantodental interval.

Skull base and vertebrae: Skull base appears intact. Normal C1-C2
relationship. Intact dense. No acute fracture or suspicious osseous
lesions.

Soft tissues and spinal canal: No prevertebral fluid or swelling. No
visible canal hematoma.

Disc levels: Multilevel degenerative disc disease mild to moderate
at C2-3 and C3-4 and moderate-to-marked from Celsius 4 through C7.
No jumped or perched facets. Mild right side foraminal narrowing at
C4-5, C5-6 and C6-7 due to uncovertebral joint and endplate
degenerative spurring

Upper chest: Negative.

Other: None
IMPRESSION: 1. No acute intracranial abnormality.
2. Stable atrophy with chronic minimal small vessel ischemic
disease.
3. Chronic stable cervical spondylosis without acute cervical spine
fracture or posttraumatic subluxation.

## 2017-11-04 IMAGING — CR DG HUMERUS 2V *R*
3 series · 3 of 3 positions shown · non-contrast
Comparison: No priors.

CLINICAL DATA: 50-year-old male with history of trauma after
falling out of bed yesterday evening. Pain in the right shoulder.

EXAM:
RIGHT HUMERUS - 2+ VIEW

[x humerus ap right]
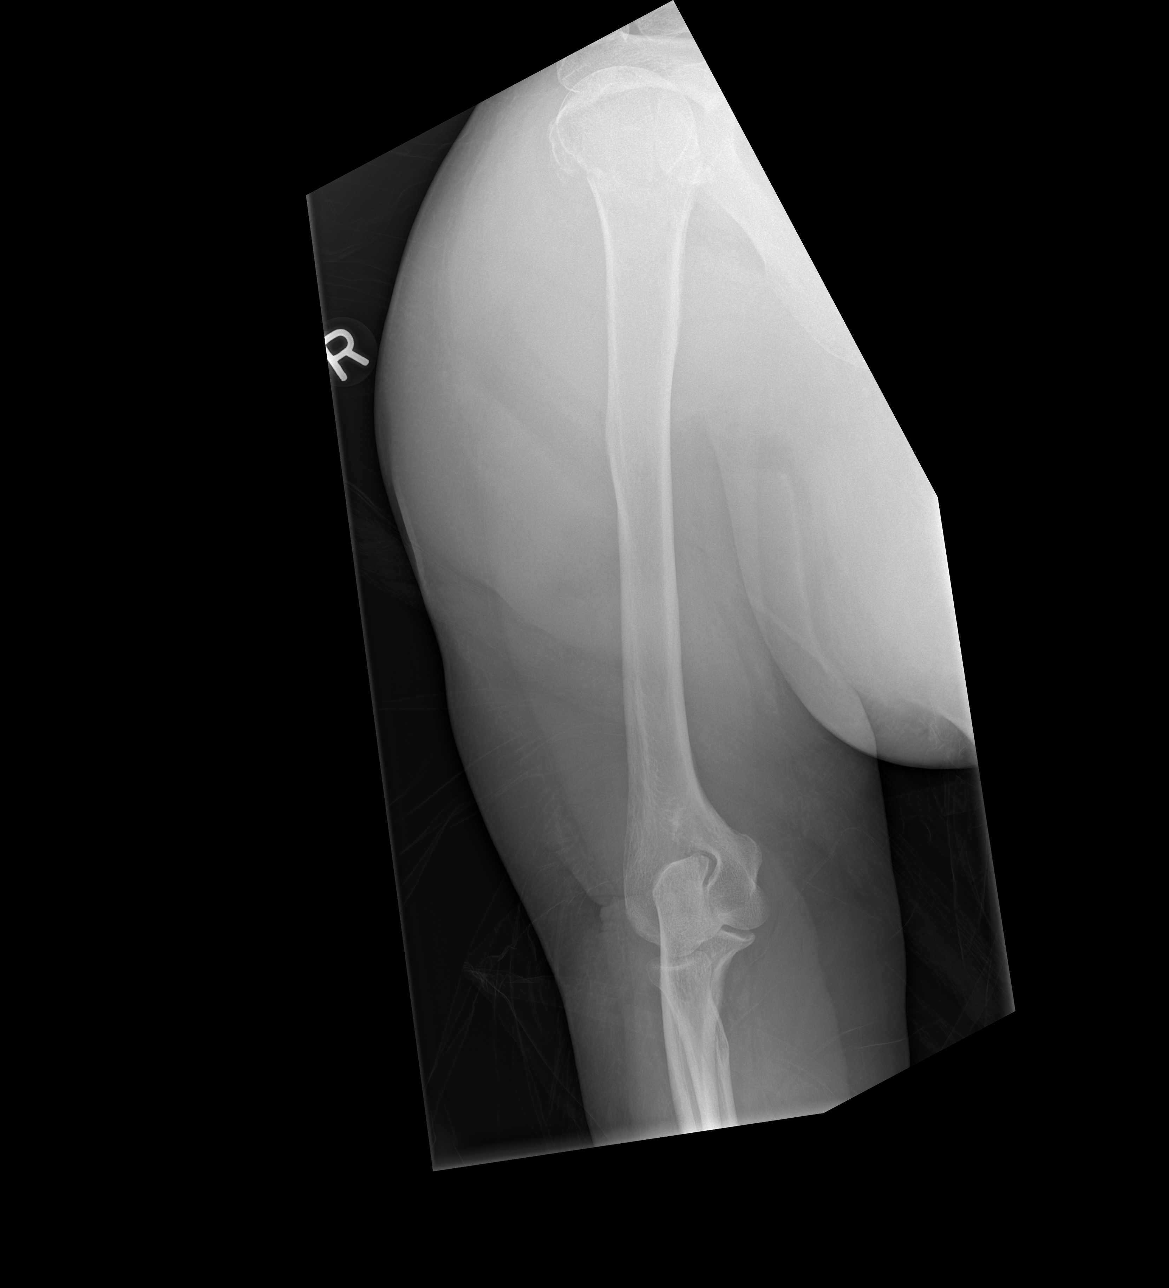

[x humerus lat right (1 of 2)]
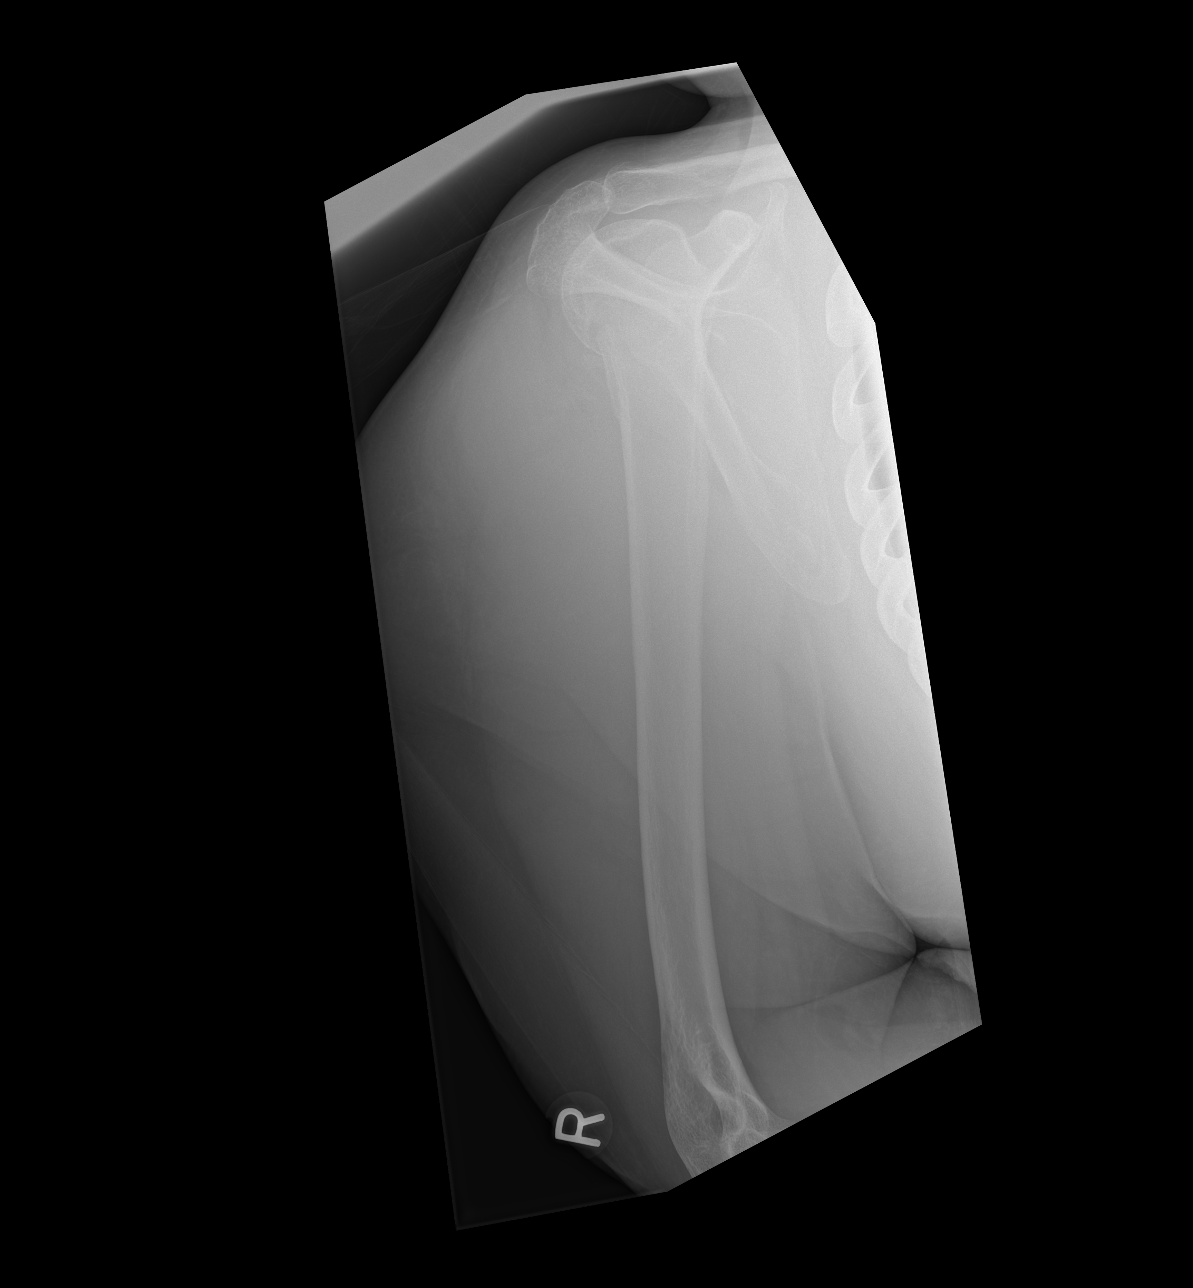

[x humerus lat right (2 of 2)]
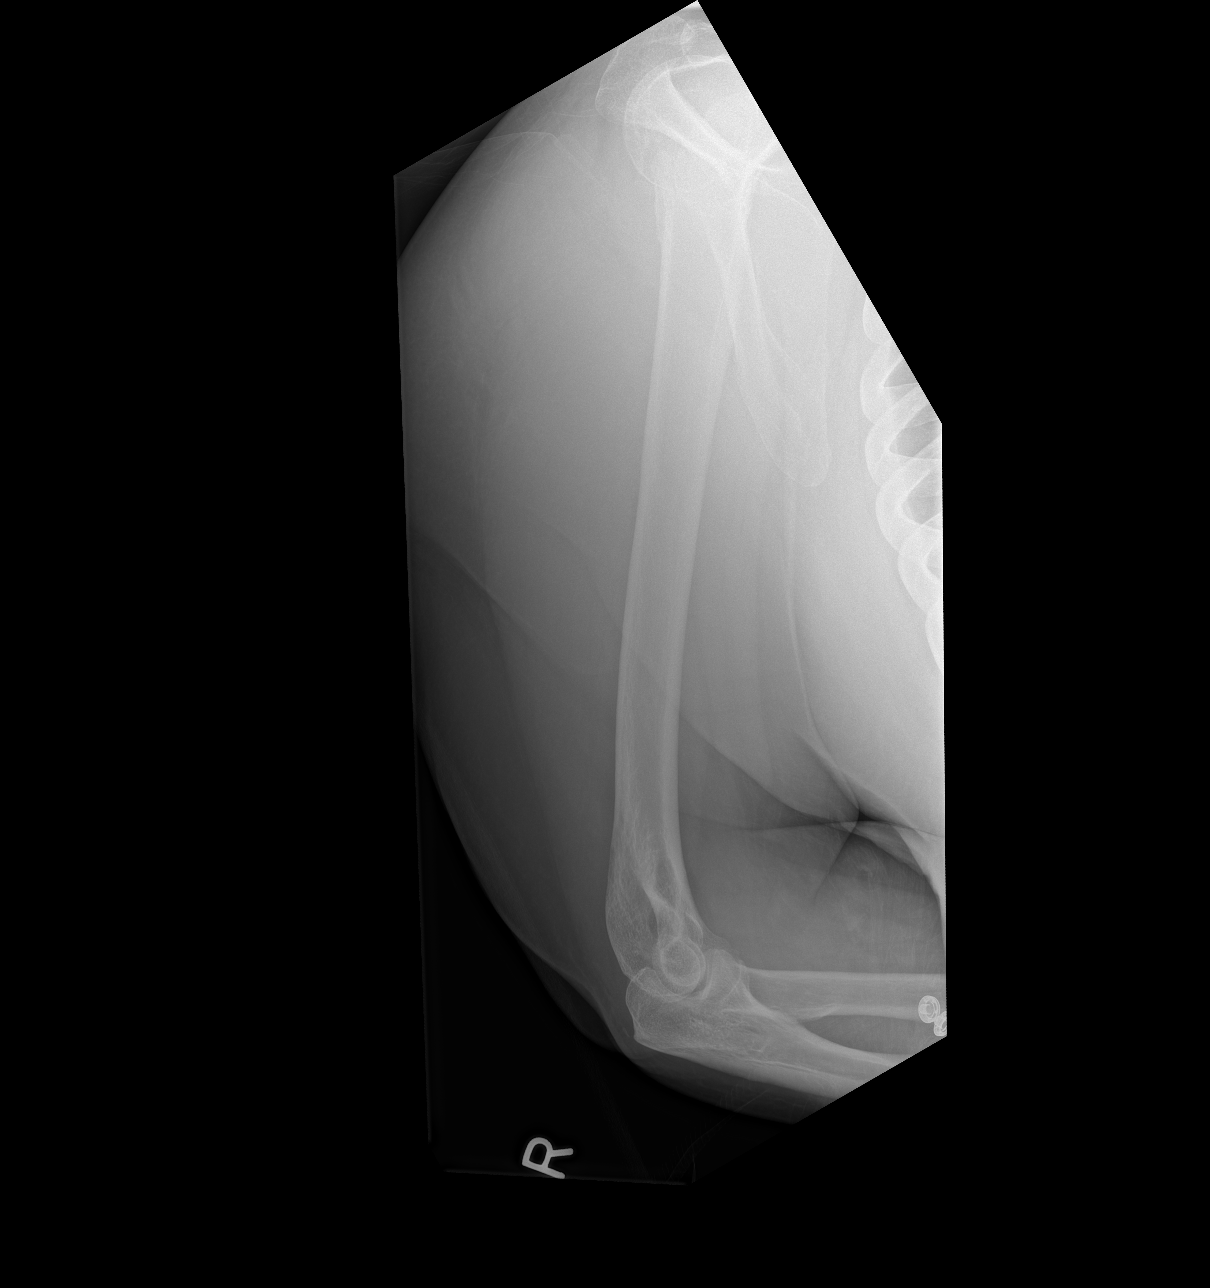

[3 of 3 positions shown; findings below may reference images not displayed]

FINDINGS: Three views of the right shoulder demonstrate an acute displaced
likely mildly impacted fracture of the surgical neck of the humerus,
with approximately 8 mm of medial displacement of the distal
fracture fragment. There may be some mild comminution of the
fracture. The humeral head appears rotated in the glenoid fossa, but
remains located. Distal aspect of the humerus is otherwise intact.
IMPRESSION: 1. Acute mildly displaced, likely impacted, potentially mildly
comminuted fracture of the surgical neck of the left humerus, as
above.

## 2017-11-05 IMAGING — DX DG CHEST 1V PORT
1 series · 1 of 1 positions shown · non-contrast
Comparison: Chest x-ray 10/02/2017.

CLINICAL DATA: 50-year-old male status post central line placement.

EXAM:
PORTABLE CHEST 1 VIEW

[chest ap]
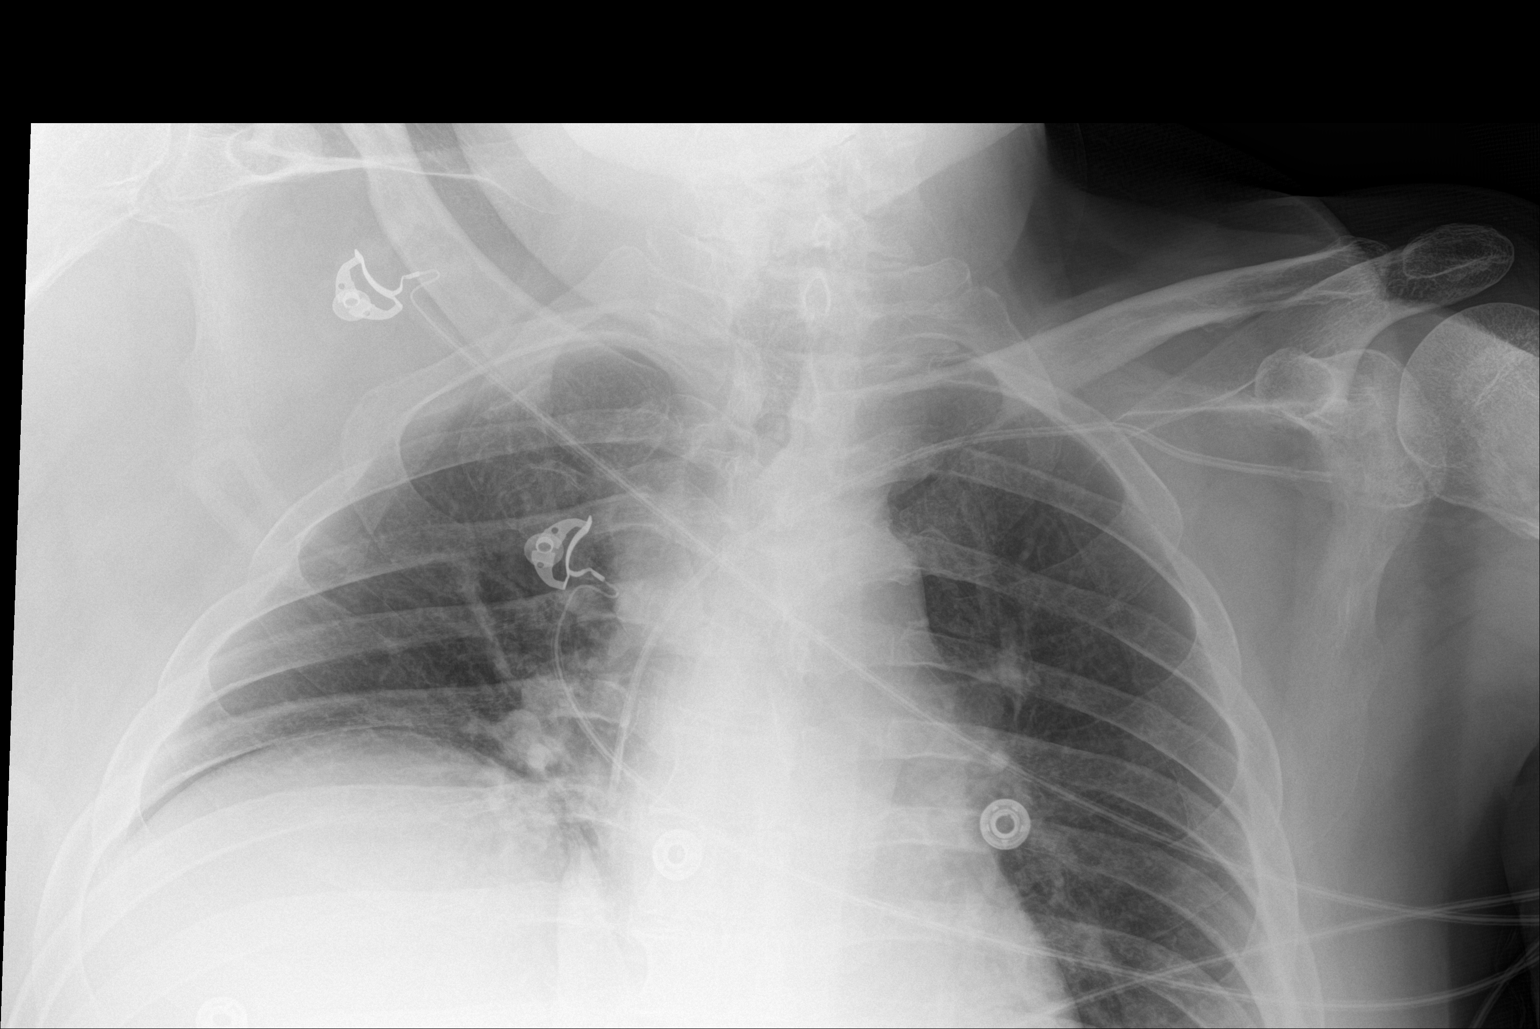

[1 of 1 positions shown; findings below may reference images not displayed]

FINDINGS: There is a left-sided subclavian central venous catheter with tip
terminating in the superior cavoatrial junction. Study is limited by
lack of visualization of the left lung base. With this limitation in
mind, lung volumes are very low. Elevation of the right
hemidiaphragm is unchanged. No acute consolidative airspace disease.
No definite pleural effusions (although a small left pleural
effusion cannot be excluded secondary to the limitations of today's
study). No evidence of pulmonary edema. Heart size is normal.
Mediastinal contours are unremarkable. No definite pneumothorax.
IMPRESSION: 1. New left subclavian central venous catheter with tip at the
superior cavoatrial junction.
2. Persistent low lung volumes.

## 2017-11-06 IMAGING — CT CT ABD-PELV W/O CM
2 of 4 series · 15 of 46 positions shown, 17 images · non-contrast
Comparison: Abdominal MRI 02/22/2017

CLINICAL DATA: Abdominal pain and fever. Evaluate for
retroperitoneal hemorrhage or abscess.

EXAM:
CT ABDOMEN AND PELVIS WITHOUT CONTRAST
TECHNIQUE: Multidetector CT imaging of the abdomen and pelvis was performed
following the standard protocol without IV contrast.

[Series 2: axial st · axial · 0.94mm/px · z∈[-778,-298]mm · 12 of 109 slices shown, 14 images]
[im 7/109  soft-tissue]
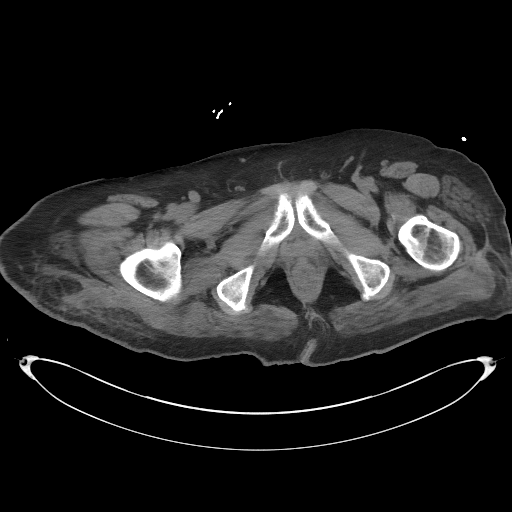
[im 7/109  bone]
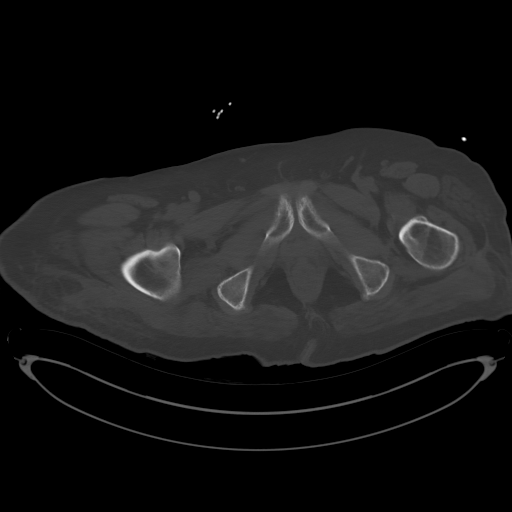
[im 19/109  soft-tissue]
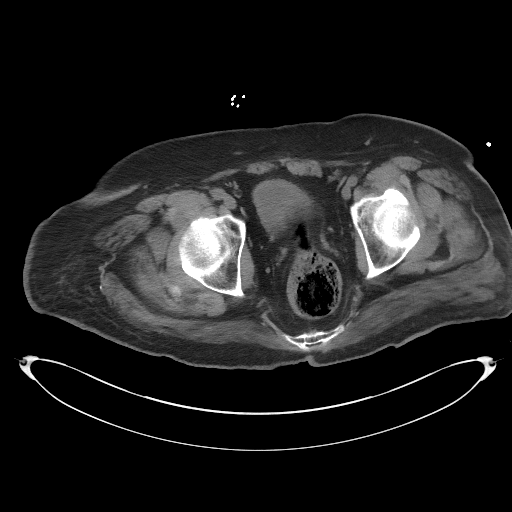
[im 25/109  soft-tissue]
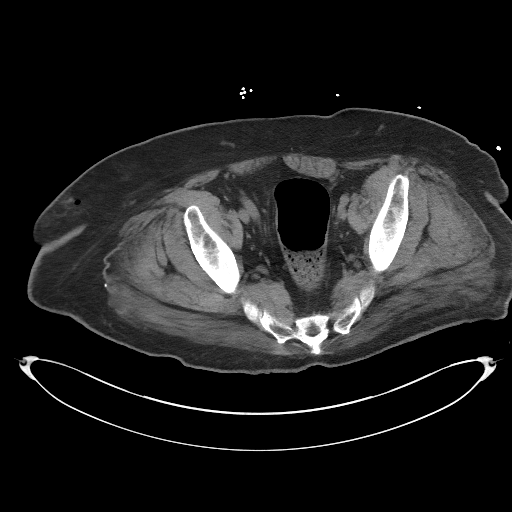
[im 31/109  soft-tissue]
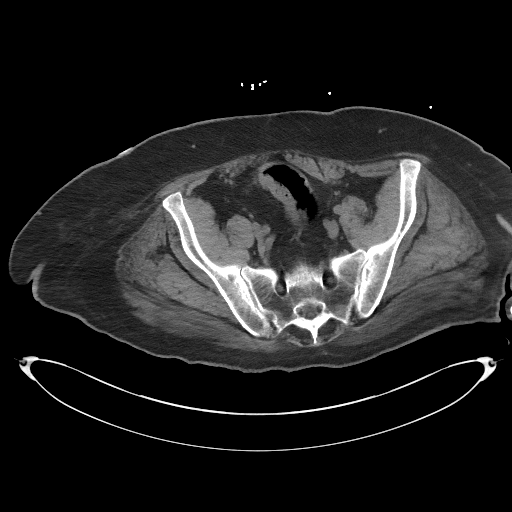
[im 43/109  soft-tissue]
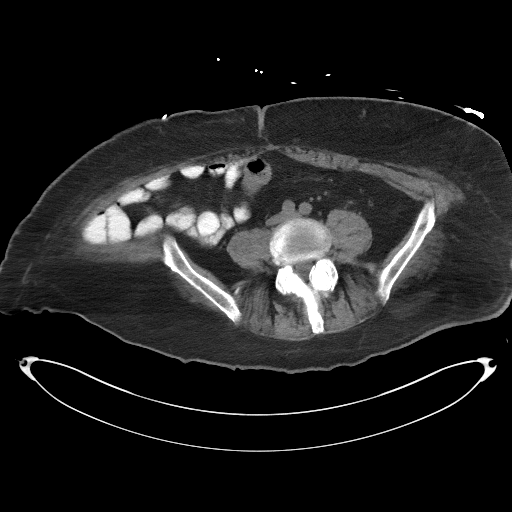
[im 49/109  soft-tissue]
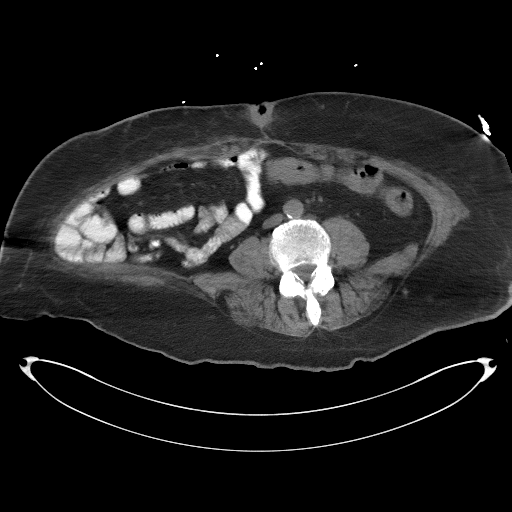
[im 61/109  soft-tissue]
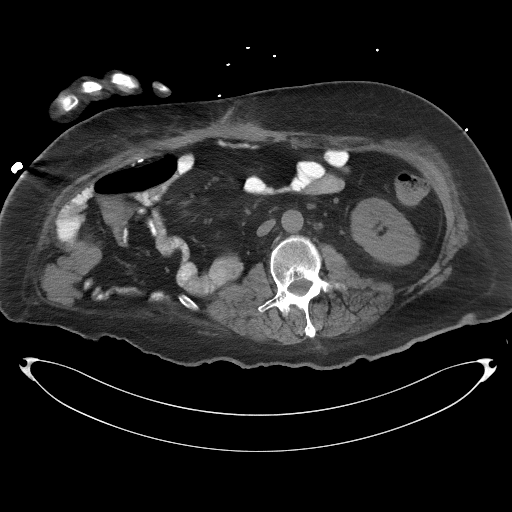
[im 67/109  soft-tissue]
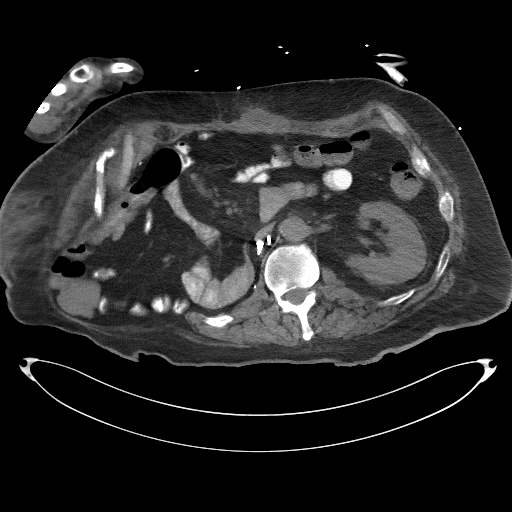
[im 79/109  soft-tissue]
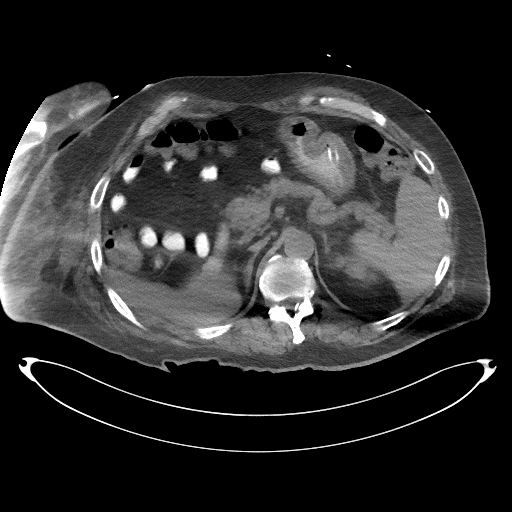
[im 79/109  bone]
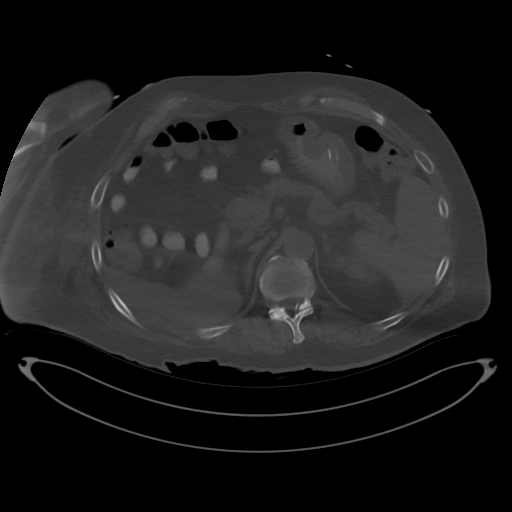
[im 85/109  soft-tissue]
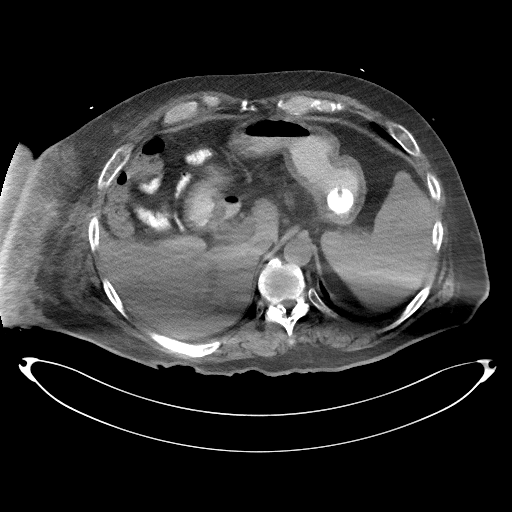
[im 91/109  soft-tissue]
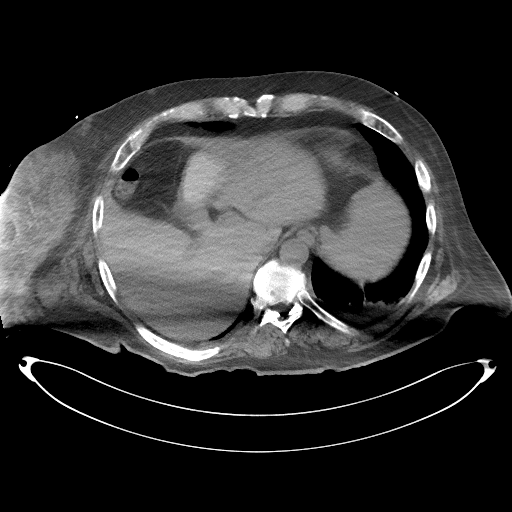
[im 103/109  soft-tissue]
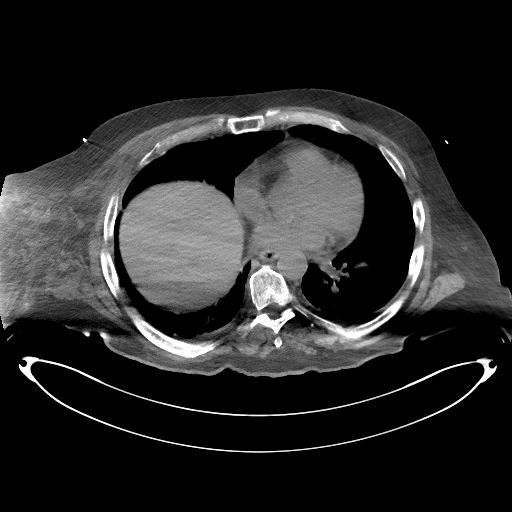

[Series 5: coronal st · coronal · 1.02mm/px · 3 of 98 slices shown]
[im 33/98  soft-tissue]
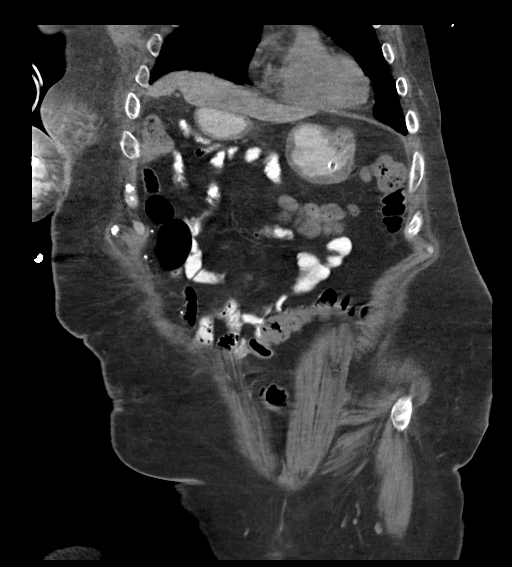
[im 44/98  soft-tissue]
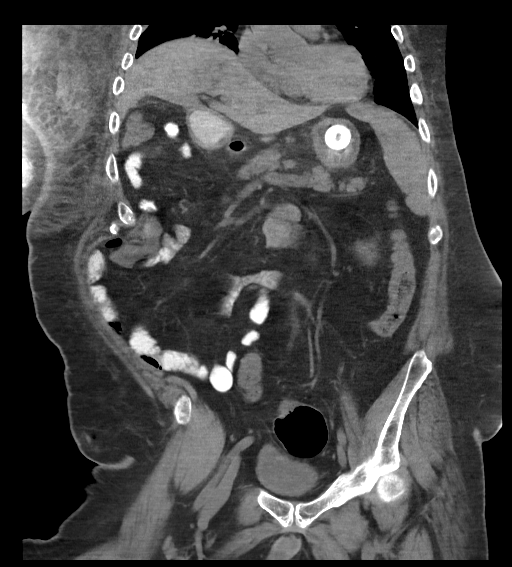
[im 54/98  soft-tissue]
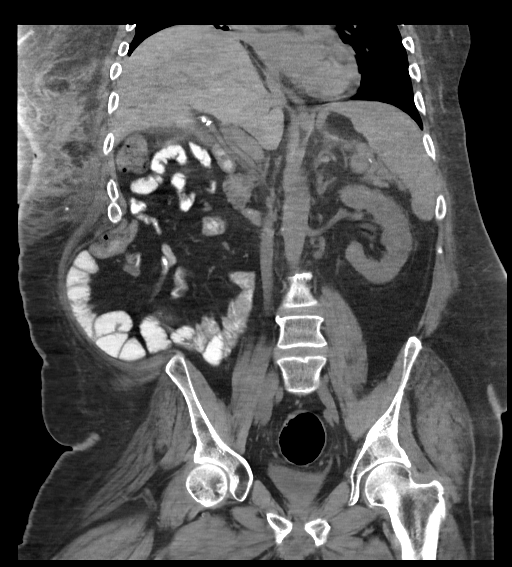

[15 of 46 positions shown; findings below may reference images not displayed]

FINDINGS: Lower chest: Asymmetric elevation right hemidiaphragm with bibasilar
atelectasis noted. Small left pleural effusion.

Hepatobiliary: No focal abnormality in the liver on this study
without intravenous contrast. Gallbladder surgically absent. No
intrahepatic or extrahepatic biliary dilation.

Pancreas: No focal mass lesion. No dilatation of the main duct. No
intraparenchymal cyst. No peripancreatic edema.

Spleen: No splenomegaly. No focal mass lesion.

Adrenals/Urinary Tract: No adrenal nodule or mass. Right kidney is
surgically absent. 2.4 cm exophytic lesion upper pole right kidney
has attenuation too high to be a simple cyst but cannot be
characterized on this study without intravenous contrast material.
Lesion is stable in size comparing to the MRI of the 8 months ago.
No left hydroureteronephrosis. The urinary bladder appears normal
for the degree of distention.

Stomach/Bowel: Gastrostomy tube is evident. Duodenum is normally
positioned as is the ligament of Treitz. No small bowel wall
thickening. No small bowel dilatation. Terminal ileum not well seen.
The appendix is not visualized, but there is no edema or
inflammation in the region of the cecum. No gross colonic mass. No
colonic wall thickening. No substantial diverticular change.

Vascular/Lymphatic: There is abdominal aortic atherosclerosis
without aneurysm. There is no gastrohepatic or hepatoduodenal
ligament lymphadenopathy. No intraperitoneal or retroperitoneal
lymphadenopathy. No pelvic sidewall lymphadenopathy.

Reproductive: The prostate gland and seminal vesicles have normal
imaging features.

Other: No intraperitoneal free fluid.

Musculoskeletal: In the midline, 3-4 cm cranial to the umbilicus, a
track of gas and debris is identified in the subcutaneous fat of the
anterior abdominal wall. This tracks deep into the rectus sheath
(images 56-61 of series 2) and suggests abscess. This does appear to
remain confined with in the rectus sheath and there is no adjacent
bowel wall thickening or definite findings to suggest that this
represents an enterocutaneous fistula. There is no adjacent loop of
colon. Bone windows reveal no worrisome lytic or sclerotic osseous
lesions. Bilateral pars interarticularis defects are noted at L5.
Hemorrhage and edema are identified in the right axillary region and
tracking down the right lateral chest wall, likely related to the
proximal humerus fracture.
IMPRESSION: 1. Tubular track of fluid and debris measuring about 2 cm in
diameter tracks from this scan about 3-4 cm cranial to the umbilicus
deep into the right paramidline rectus sheath. Anterior abdominal
wall abscess could have this appearance. There is no underlying
small bowel wall thickening to suggest enterocutaneous fistula.
2. No evidence for retroperitoneal hematoma.
3. Edema/hemorrhage is identified in the right axillary region
tracking down the right lateral chest wall, compatible with the
acute proximal humerus fracture.

## 2017-11-22 ENCOUNTER — Other Ambulatory Visit (HOSPITAL_COMMUNITY): Payer: Self-pay | Admitting: Internal Medicine

## 2017-11-22 DIAGNOSIS — R1310 Dysphagia, unspecified: Secondary | ICD-10-CM

## 2017-11-24 ENCOUNTER — Ambulatory Visit (HOSPITAL_COMMUNITY)
Admission: RE | Admit: 2017-11-24 | Discharge: 2017-11-24 | Disposition: A | Discharge status: Home / Self Care | Payer: Medicare Other | Source: Ambulatory Visit | Attending: Internal Medicine | Admitting: Internal Medicine

## 2017-11-24 ENCOUNTER — Encounter (HOSPITAL_COMMUNITY): Payer: Self-pay | Admitting: Interventional Radiology

## 2017-11-24 DIAGNOSIS — Y733 Surgical instruments, materials and gastroenterology and urology devices (including sutures) associated with adverse incidents: Secondary | ICD-10-CM | POA: Insufficient documentation

## 2017-11-24 DIAGNOSIS — K9423 Gastrostomy malfunction: Secondary | ICD-10-CM | POA: Diagnosis not present

## 2017-11-24 DIAGNOSIS — R1310 Dysphagia, unspecified: Secondary | ICD-10-CM | POA: Diagnosis not present

## 2017-11-24 HISTORY — PX: IR REPLC GASTRO/COLONIC TUBE PERCUT W/FLUORO: IMG2333

## 2017-11-24 MED ORDER — IOPAMIDOL (ISOVUE-300) INJECTION 61%
10.0000 mL | Freq: Once | INTRAVENOUS | Status: AC | PRN
  Administered 2017-11-24: 10 mL

## 2017-11-24 MED ORDER — LIDOCAINE VISCOUS 2 % MT SOLN
OROMUCOSAL | Status: AC
  Filled 2017-11-24: qty 15

## 2017-11-24 MED ORDER — IOPAMIDOL (ISOVUE-300) INJECTION 61%
INTRAVENOUS | Status: AC
  Filled 2017-11-24: qty 50

## 2017-11-24 NOTE — Procedures (Signed)
Pre procedural Dx: Dysphagia, poorly functioning feeding tube. °Post procedural Dx: Same ° °Successful fluoroscopic guided replacement of exisitng 24 Fr gastrostomy tube.   °The feeding tube is ready for immediate use. ° °EBL: None ° °Complications: None immediate. ° °Jay Olof Marcil, MD °Pager #: [phone] ° °

## 2017-12-27 IMAGING — XA IR REPLACE G-TUBE/COLONIC TUBE
4 series · 4 of 4 positions shown · non-contrast
Comparison: Fluoroscopic guided gastrostomy tube exchange -
07/22/2017

INDICATION: Chronic gastrostomy tube, now clogged. Please perform fluoroscopic
guided exchange.

EXAM:
FLUOROSCOPIC GUIDED REPLACEMENT OF GASTROSTOMY TUBE

[Series 1: fl (-) angio · 1 of 1 slices shown (1 of 4)]
[im 1/1]
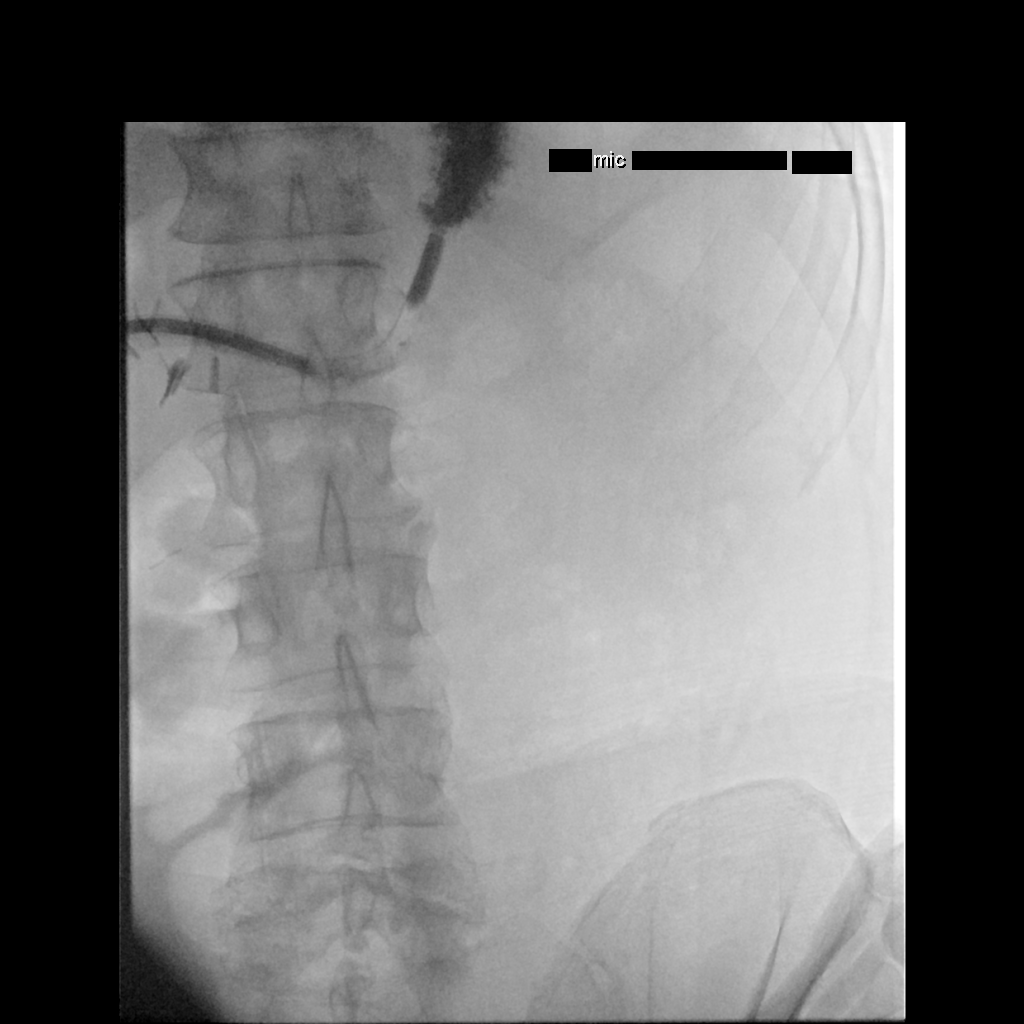

[Series 2: fl (-) angio · 1 of 1 slices shown (2 of 4)]
[im 1/1]
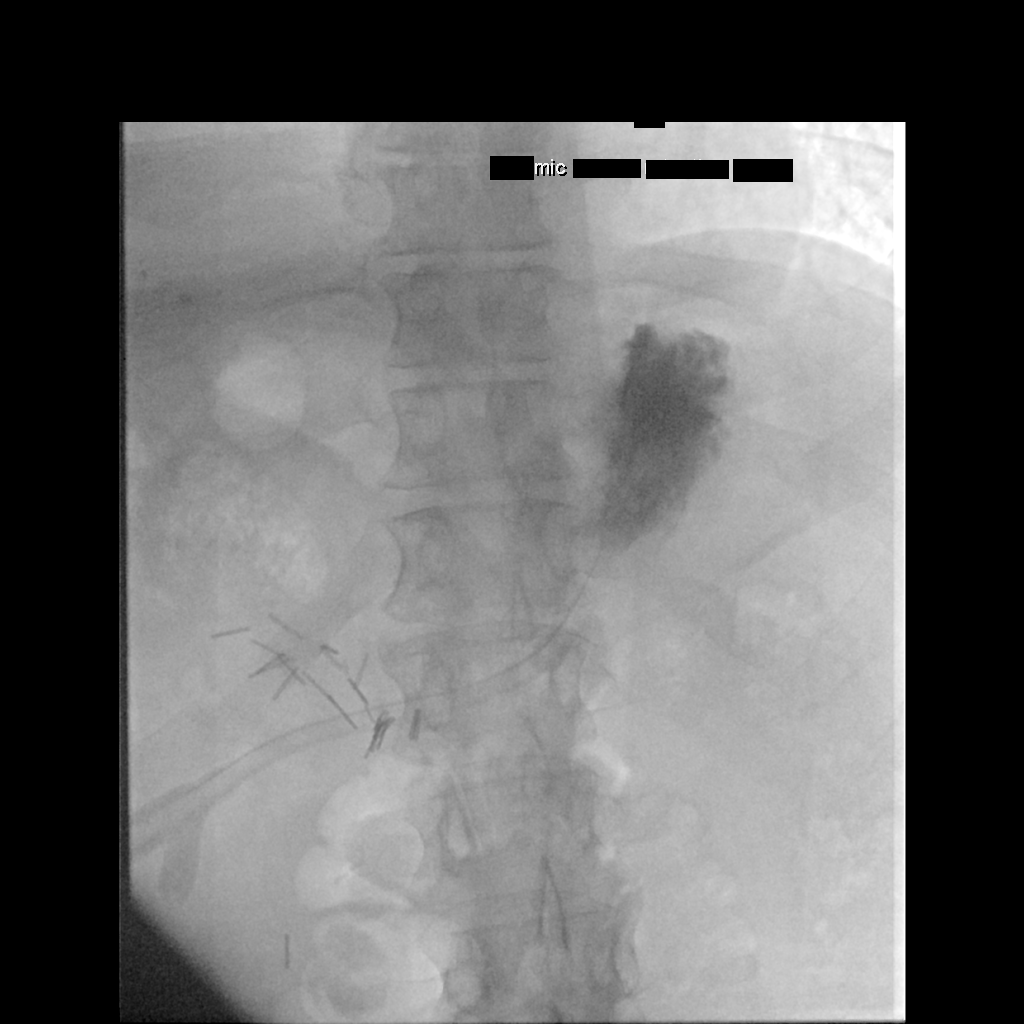

[Series 3: fl (-) angio · 1 of 1 slices shown (3 of 4)]
[im 1/1]
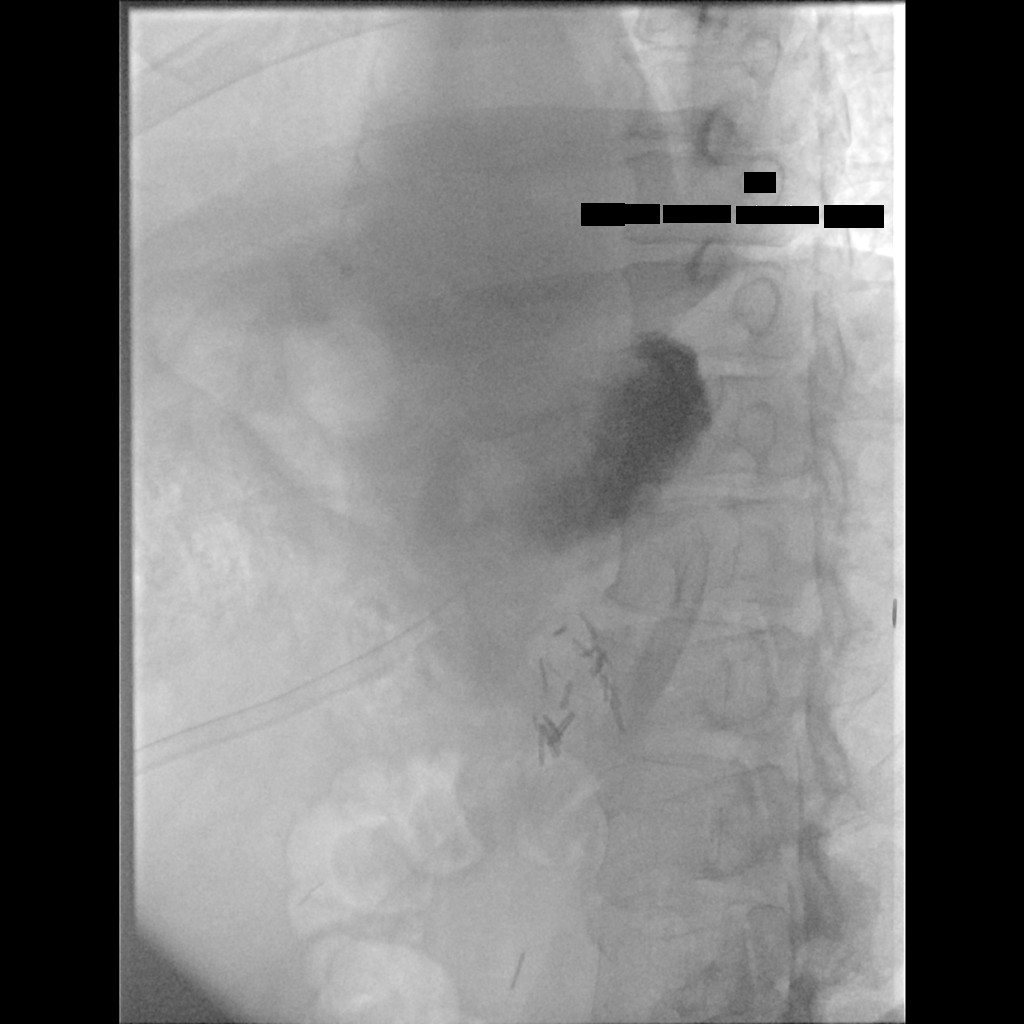

[Series 4: fl (-) angio · 1 of 1 slices shown (4 of 4)]
[im 1/1]
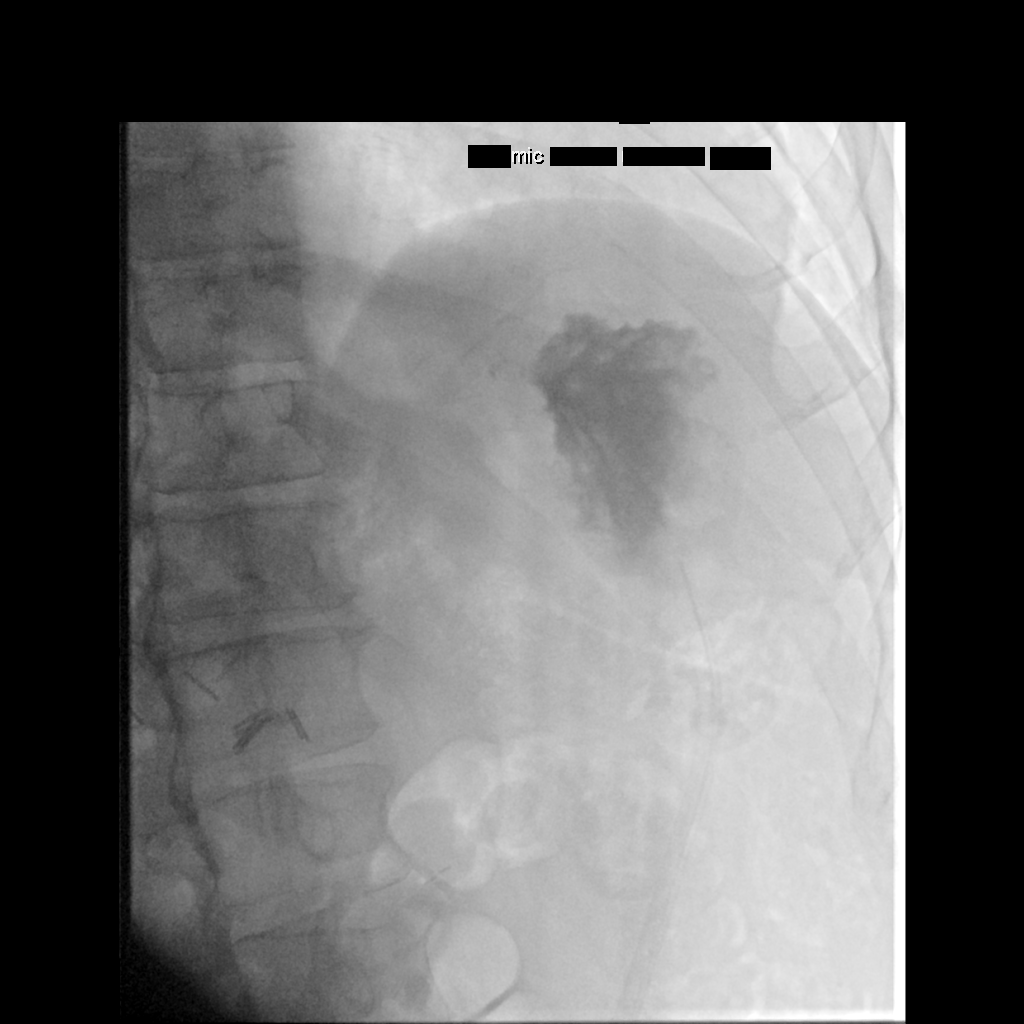

[4 of 4 positions shown; findings below may reference images not displayed]

MEDICATIONS:
None.

CONTRAST:  10mL 34UHQ4-1UU IOPAMIDOL ([id number]) INJECTION 61%
administered into the gastric lumen

FLUOROSCOPY TIME:  18 seconds (2 mGy)

COMPLICATIONS:
None immediate.

PROCEDURE:
The existing gastrostomy tube was injected with a small amount of
contrast confirming appropriate positioning within the gastric
lumen. The existing gastrostomy tube was removed and exchanged for a
new 24-Dingbang Tasinato balloon inflatable gastrostomy tube. The balloon
was inflated and disc was cinched. Contrast was injected and several
spot fluoroscopic images were obtained in various obliquities to
confirm appropriate intraluminal positioning. A dressing was placed.
The patient tolerated the procedure well without immediate
postprocedural complication.
IMPRESSION: Successful fluoroscopic guided replacement of a new 24-French
gastrostomy tube. The new gastrostomy tube is ready for immediate
use.

## 2018-01-25 ENCOUNTER — Inpatient Hospital Stay (HOSPITAL_COMMUNITY)
Admission: EM | Admit: 2018-01-25 | Discharge: 2018-01-30 | DRG: 901 | Disposition: A | Discharge status: Skilled Nursing Facility | Payer: Medicare Other | Attending: Family Medicine | Admitting: Family Medicine

## 2018-01-25 ENCOUNTER — Encounter (HOSPITAL_COMMUNITY): Payer: Self-pay

## 2018-01-25 ENCOUNTER — Emergency Department (HOSPITAL_COMMUNITY): Payer: Medicare Other

## 2018-01-25 DIAGNOSIS — I255 Ischemic cardiomyopathy: Secondary | ICD-10-CM | POA: Diagnosis present

## 2018-01-25 DIAGNOSIS — Z905 Acquired absence of kidney: Secondary | ICD-10-CM

## 2018-01-25 DIAGNOSIS — Z1629 Resistance to other single specified antibiotic: Secondary | ICD-10-CM | POA: Diagnosis present

## 2018-01-25 DIAGNOSIS — I252 Old myocardial infarction: Secondary | ICD-10-CM | POA: Diagnosis not present

## 2018-01-25 DIAGNOSIS — K219 Gastro-esophageal reflux disease without esophagitis: Secondary | ICD-10-CM | POA: Diagnosis present

## 2018-01-25 DIAGNOSIS — I2510 Atherosclerotic heart disease of native coronary artery without angina pectoris: Secondary | ICD-10-CM | POA: Diagnosis present

## 2018-01-25 DIAGNOSIS — Z87820 Personal history of traumatic brain injury: Secondary | ICD-10-CM | POA: Diagnosis not present

## 2018-01-25 DIAGNOSIS — B9562 Methicillin resistant Staphylococcus aureus infection as the cause of diseases classified elsewhere: Secondary | ICD-10-CM | POA: Diagnosis present

## 2018-01-25 DIAGNOSIS — L0291 Cutaneous abscess, unspecified: Secondary | ICD-10-CM

## 2018-01-25 DIAGNOSIS — G40909 Epilepsy, unspecified, not intractable, without status epilepticus: Secondary | ICD-10-CM | POA: Diagnosis present

## 2018-01-25 DIAGNOSIS — Z431 Encounter for attention to gastrostomy: Secondary | ICD-10-CM

## 2018-01-25 DIAGNOSIS — Z87440 Personal history of urinary (tract) infections: Secondary | ICD-10-CM

## 2018-01-25 DIAGNOSIS — G8250 Quadriplegia, unspecified: Secondary | ICD-10-CM | POA: Diagnosis not present

## 2018-01-25 DIAGNOSIS — H409 Unspecified glaucoma: Secondary | ICD-10-CM | POA: Diagnosis present

## 2018-01-25 DIAGNOSIS — E785 Hyperlipidemia, unspecified: Secondary | ICD-10-CM | POA: Diagnosis present

## 2018-01-25 DIAGNOSIS — E162 Hypoglycemia, unspecified: Secondary | ICD-10-CM | POA: Diagnosis present

## 2018-01-25 DIAGNOSIS — W19XXXA Unspecified fall, initial encounter: Secondary | ICD-10-CM

## 2018-01-25 DIAGNOSIS — G931 Anoxic brain damage, not elsewhere classified: Secondary | ICD-10-CM | POA: Diagnosis present

## 2018-01-25 DIAGNOSIS — T8579XA Infection and inflammatory reaction due to other internal prosthetic devices, implants and grafts, initial encounter: Principal | ICD-10-CM | POA: Diagnosis present

## 2018-01-25 DIAGNOSIS — F419 Anxiety disorder, unspecified: Secondary | ICD-10-CM | POA: Diagnosis not present

## 2018-01-25 DIAGNOSIS — L02211 Cutaneous abscess of abdominal wall: Secondary | ICD-10-CM | POA: Diagnosis present

## 2018-01-25 DIAGNOSIS — E039 Hypothyroidism, unspecified: Secondary | ICD-10-CM | POA: Diagnosis present

## 2018-01-25 DIAGNOSIS — Z7401 Bed confinement status: Secondary | ICD-10-CM

## 2018-01-25 DIAGNOSIS — Z8679 Personal history of other diseases of the circulatory system: Secondary | ICD-10-CM

## 2018-01-25 DIAGNOSIS — Z79899 Other long term (current) drug therapy: Secondary | ICD-10-CM

## 2018-01-25 DIAGNOSIS — W06XXXA Fall from bed, initial encounter: Secondary | ICD-10-CM | POA: Diagnosis present

## 2018-01-25 DIAGNOSIS — E271 Primary adrenocortical insufficiency: Secondary | ICD-10-CM | POA: Diagnosis present

## 2018-01-25 DIAGNOSIS — I480 Paroxysmal atrial fibrillation: Secondary | ICD-10-CM | POA: Diagnosis not present

## 2018-01-25 DIAGNOSIS — Z931 Gastrostomy status: Secondary | ICD-10-CM

## 2018-01-25 DIAGNOSIS — R1310 Dysphagia, unspecified: Secondary | ICD-10-CM

## 2018-01-25 DIAGNOSIS — Z8674 Personal history of sudden cardiac arrest: Secondary | ICD-10-CM

## 2018-01-25 DIAGNOSIS — F329 Major depressive disorder, single episode, unspecified: Secondary | ICD-10-CM | POA: Diagnosis present

## 2018-01-25 DIAGNOSIS — Y832 Surgical operation with anastomosis, bypass or graft as the cause of abnormal reaction of the patient, or of later complication, without mention of misadventure at the time of the procedure: Secondary | ICD-10-CM | POA: Diagnosis present

## 2018-01-25 LAB — CBC WITH DIFFERENTIAL/PLATELET
Basophils Absolute: 0 10*3/uL (ref 0.0–0.1)
Basophils Relative: 0 %
Eosinophils Absolute: 0.1 10*3/uL (ref 0.0–0.7)
Eosinophils Relative: 2 %
HCT: 39 % (ref 39.0–52.0)
HEMOGLOBIN: 12.6 g/dL — AB (ref 13.0–17.0)
LYMPHS ABS: 1.9 10*3/uL (ref 0.7–4.0)
Lymphocytes Relative: 23 %
MCH: 27 pg (ref 26.0–34.0)
MCHC: 32.3 g/dL (ref 30.0–36.0)
MCV: 83.7 fL (ref 78.0–100.0)
MONOS PCT: 12 %
Monocytes Absolute: 1 10*3/uL (ref 0.1–1.0)
NEUTROS ABS: 5.3 10*3/uL (ref 1.7–7.7)
NEUTROS PCT: 63 %
Platelets: 174 10*3/uL (ref 150–400)
RBC: 4.66 MIL/uL (ref 4.22–5.81)
RDW: 14.7 % (ref 11.5–15.5)
WBC: 8.3 10*3/uL (ref 4.0–10.5)

## 2018-01-25 LAB — COMPREHENSIVE METABOLIC PANEL
ALBUMIN: 3 g/dL — AB (ref 3.5–5.0)
ALK PHOS: 71 U/L (ref 38–126)
ALT: 9 U/L — AB (ref 17–63)
ANION GAP: 9 (ref 5–15)
AST: 15 U/L (ref 15–41)
BUN: 15 mg/dL (ref 6–20)
CO2: 26 mmol/L (ref 22–32)
CREATININE: 1.05 mg/dL (ref 0.61–1.24)
Calcium: 8.9 mg/dL (ref 8.9–10.3)
Chloride: 107 mmol/L (ref 101–111)
GFR calc non Af Amer: >60 mL/min (ref >60)
GLUCOSE: 92 mg/dL (ref 65–99)
Potassium: 4.6 mmol/L (ref 3.5–5.1)
Sodium: 142 mmol/L (ref 135–145)
TOTAL PROTEIN: 7.3 g/dL (ref 6.5–8.1)
Total Bilirubin: 0.1 mg/dL — ABNORMAL LOW (ref 0.3–1.2)

## 2018-01-25 LAB — PROTIME-INR
INR: 1.14
PROTHROMBIN TIME: 14.5 s (ref 11.4–15.2)

## 2018-01-25 LAB — I-STAT CG4 LACTIC ACID, ED: Lactic Acid, Venous: 1.78 mmol/L (ref 0.5–1.9)

## 2018-01-25 LAB — APTT: APTT: 36 s (ref 24–36)

## 2018-01-25 MED ORDER — ENOXAPARIN SODIUM 40 MG/0.4ML ~~LOC~~ SOLN
40.0000 mg | Freq: Every day | SUBCUTANEOUS | Status: DC
  Administered 2018-01-26 – 2018-01-29 (×5): 40 mg via SUBCUTANEOUS
  Filled 2018-01-25 (×5): qty 0.4

## 2018-01-25 MED ORDER — DOCUSATE SODIUM 100 MG PO CAPS
100.0000 mg | ORAL_CAPSULE | Freq: Two times a day (BID) | ORAL | Status: DC
  Administered 2018-01-26 – 2018-01-27 (×4): 100 mg via ORAL
  Filled 2018-01-25 (×3): qty 1

## 2018-01-25 MED ORDER — DIVALPROEX SODIUM 125 MG PO CSDR
500.0000 mg | DELAYED_RELEASE_CAPSULE | Freq: Three times a day (TID) | ORAL | Status: DC
  Administered 2018-01-26 – 2018-01-30 (×13): 500 mg via ORAL
  Filled 2018-01-25 (×17): qty 4

## 2018-01-25 MED ORDER — SODIUM CHLORIDE 0.9 % IV SOLN
1.0000 g | Freq: Three times a day (TID) | INTRAVENOUS | Status: DC
  Administered 2018-01-26 – 2018-01-28 (×8): 1 g via INTRAVENOUS
  Filled 2018-01-25 (×11): qty 1

## 2018-01-25 MED ORDER — TRAZODONE HCL 50 MG PO TABS
50.0000 mg | ORAL_TABLET | Freq: Two times a day (BID) | ORAL | Status: DC
  Administered 2018-01-26 – 2018-01-30 (×7): 50 mg via ORAL
  Filled 2018-01-25 (×7): qty 1

## 2018-01-25 MED ORDER — VANCOMYCIN HCL 10 G IV SOLR
2000.0000 mg | Freq: Once | INTRAVENOUS | Status: AC
  Administered 2018-01-25: 2000 mg via INTRAVENOUS
  Filled 2018-01-25: qty 2000

## 2018-01-25 MED ORDER — POLYETHYLENE GLYCOL 3350 17 G PO PACK
17.0000 g | PACK | Freq: Two times a day (BID) | ORAL | Status: DC
  Administered 2018-01-26 – 2018-01-29 (×5): 17 g via ORAL
  Filled 2018-01-25 (×4): qty 1

## 2018-01-25 MED ORDER — IOPAMIDOL (ISOVUE-300) INJECTION 61%
INTRAVENOUS | Status: AC
  Administered 2018-01-25: 100 mL
  Filled 2018-01-25: qty 100

## 2018-01-25 MED ORDER — ADULT MULTIVITAMIN LIQUID CH
15.0000 mL | Freq: Every day | ORAL | Status: DC
  Administered 2018-01-27 – 2018-01-30 (×4): 15 mL
  Filled 2018-01-25 (×5): qty 15

## 2018-01-25 MED ORDER — PANTOPRAZOLE SODIUM 40 MG PO PACK
40.0000 mg | PACK | Freq: Every day | ORAL | Status: DC
  Administered 2018-01-26 – 2018-01-30 (×5): 40 mg
  Filled 2018-01-25 (×6): qty 20

## 2018-01-25 MED ORDER — PAROXETINE HCL 20 MG PO TABS
20.0000 mg | ORAL_TABLET | Freq: Every day | ORAL | Status: DC
  Administered 2018-01-28 – 2018-01-30 (×3): 20 mg via ORAL
  Filled 2018-01-25 (×4): qty 1

## 2018-01-25 MED ORDER — VITAL 1.5 CAL PO LIQD
237.0000 mL | ORAL | Status: DC | PRN
  Filled 2018-01-25: qty 237

## 2018-01-25 MED ORDER — POLYVINYL ALCOHOL 1.4 % OP SOLN
1.0000 [drp] | Freq: Four times a day (QID) | OPHTHALMIC | Status: DC
  Administered 2018-01-26 – 2018-01-30 (×13): 1 [drp] via OPHTHALMIC
  Filled 2018-01-25: qty 15

## 2018-01-25 MED ORDER — SENNOSIDES 8.8 MG/5ML PO SYRP
15.0000 mL | ORAL_SOLUTION | Freq: Two times a day (BID) | ORAL | Status: DC
  Administered 2018-01-26 – 2018-01-29 (×5): 15 mL via ORAL
  Filled 2018-01-25 (×11): qty 15

## 2018-01-25 MED ORDER — LEVOTHYROXINE SODIUM 100 MCG PO TABS
200.0000 ug | ORAL_TABLET | Freq: Every day | ORAL | Status: DC
  Filled 2018-01-25: qty 1

## 2018-01-25 MED ORDER — RISPERIDONE 1 MG PO TBDP
2.0000 mg | ORAL_TABLET | Freq: Two times a day (BID) | ORAL | Status: DC
  Administered 2018-01-26 – 2018-01-30 (×7): 2 mg via SUBLINGUAL
  Filled 2018-01-25 (×11): qty 2

## 2018-01-25 MED ORDER — LACTATED RINGERS IV SOLN
INTRAVENOUS | Status: DC
  Administered 2018-01-26: 02:00:00 via INTRAVENOUS

## 2018-01-25 MED ORDER — METOPROLOL TARTRATE 25 MG/10 ML ORAL SUSPENSION
12.5000 mg | Freq: Two times a day (BID) | ORAL | Status: DC
  Administered 2018-01-26 – 2018-01-30 (×9): 12.5 mg
  Filled 2018-01-25 (×12): qty 5

## 2018-01-25 NOTE — Progress Notes (Signed)
A consult was received from an ED physician for Vancomycin per pharmacy dosing.  The patient's profile has been reviewed for ht/wt/allergies/indication/available labs. A one time order has been placed for the above antibiotics.  Further antibiotics/pharmacy consults should be ordered by admitting physician if indicated.                       Thank you, Bernadene Personrew Jonnie Kubly, PharmD, BCPS [phone] 01/25/2018, 8:44 PM

## 2018-01-25 NOTE — H&P (Addendum)
History and Physical    Castiel Lauricella ZOX:[id number] DOB: 03-Sep-1966 DOA: 01/25/2018  PCP: Georgann Housekeeper, MD Patient coming from: SNF  I have personally briefly reviewed patient's old medical records in [facility]  Chief Complaint: Abdominal wall abscess  HPI: Ace Bergfeld is a 52 y.o. 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 fib not on systemic anticoagulation, GERD and adrenal insufficiency who presents to the ED from his significant worsening abdominal wall abscess.  Patient has been on doxycycline in the outpatient setting without improvement in his symptoms.  He reports pain in his abdomen, but denies fever, chills, night sweats, nausea, vomiting, urinary changes, difficulties with this PEG tube, or other infectious symptoms.  Patient is unable to verbalize much to his history of anoxic brain injury.  Of note, CT abdomen pelvis in October 2017 did demonstrate a large soft tissue mass also concerning for abscess versus hematoma in October 2017, surgical intervention was deferred at that time.  ED Course: In the ED, patient afebrile, heart rate 86, blood pressure normotensive, saturating comfortably on room air.  Labs notable for white count 8.3, Hgb 12.6, platelets 174, normal electro lites, normal renal function, normal LFTs.  CT abdomen pelvis showed large abdominal wall abscess, enlarged from prior study in October 2017; area involved does not include the PEG or PEG tract.  General surgery was consulted with plans for operative intervention.  He received vancomycin and meropenem in the ED.  Review of Systems: As per HPI otherwise 10 point review of systems negative.   Past Medical History:  Diagnosis Date  . A-fib (HCC)   . Addison disease (HCC)   . Addison disease (HCC)   . Anoxic brain injury (HCC)   . Anxiety   . Aspiration pneumonia (HCC)   .  Atrial fibrillation (HCC)   . Chronic constipation 11/23/2011  . Dysphagia   . Dysphagia   . Encephalopathy   . Glaucoma   . Glucocorticoid deficiency (HCC)   . History of recurrent UTIs   . Hyperlipemia   . Hypothyroidism   . Mental disorder   . Myocardial infarction (HCC)   . Pneumoperitoneum of unknown etiology 12/21/2011  . Quadriplegia (HCC)   . Reflux   . Seizure disorder [facility])     Past Surgical History:  Procedure Laterality Date  . IR GENERIC HISTORICAL  10/20/2016   IR REPLC GASTRO/COLONIC TUBE PERCUT W/FLUORO 10/20/2016 Oley Balm, MD [facility]-INTERV RAD  . IR REPLC GASTRO/COLONIC TUBE PERCUT W/FLUORO  04/21/2017  . IR REPLC GASTRO/COLONIC TUBE PERCUT W/FLUORO  07/22/2017  . IR REPLC GASTRO/COLONIC TUBE PERCUT W/FLUORO  11/24/2017  . NEPHRECTOMY  unknown  . PEG TUBE PLACEMENT       reports that  has never smoked. he has never used smokeless tobacco. He reports that he does not drink alcohol or use drugs.  Allergies  Allergen Reactions  . Ativan [Lorazepam] Other (See Comments)    Unknown (not listed on physician's orders from [facility] 07/05/15)  . Latex Dermatitis  . Morphine And Related Other (See Comments)    Listed on physician's orders from [facility] 07/05/15  . Penicillins Other (See Comments)    Listed on physician's orders from [facility] 07/05/15 Has patient had a PCN reaction causing immediate rash, facial/tongue/throat swelling, SOB or lightheadedness with hypotension: unknown Has patient had a PCN reaction causing severe rash involving mucus membranes or skin necrosis: unknown Has  patient had a PCN reaction that required hospitalization: no Has patient had a PCN reaction occurring within the last 10 years: yes If all of the above answers are "NO", then may proceed with Cephalosporin [place]  . Pyridium [Phenazopyridine Hcl] Other (See Comments)    Listed on physician's orders from [facility] 07/05/15  . Soy Allergy Other (See Comments)    Listed on  physician's orders from [facility] 07/05/15    History reviewed. No pertinent family history. Unacceptable: Noncontributory, unremarkable, or negative. Acceptable: Family history reviewed and not pertinent (If you reviewed it)  Prior to Admission medications   Medication Sig Start Date End Date Taking? Authorizing Provider  acetaminophen (TYLENOL) 325 MG tablet Place 650 mg into feeding tube every 4 (four) hours as needed for mild pain.   Yes [provider]  divalproex (DEPAKOTE SPRINKLE) 125 MG capsule Take 500 mg by mouth 3 (three) times daily.  07/18/13  Yes [provider]  docusate sodium (COLACE) 100 MG capsule Take 100 mg by mouth 2 (two) times daily.   Yes [provider]  levothyroxine (SYNTHROID, LEVOTHROID) 200 MCG tablet Take 200 mcg by mouth daily before breakfast.   Yes [provider]  loratadine (CLARITIN) 10 MG tablet 10 mg by PEG Tube route daily.    Yes [provider]  METOPROLOL SUCCINATE ER PO 12.5 mg by PEG Tube route 2 (two) times daily. Metoprolol tartrate 6.25 mg/5 ml suspension   Yes [provider]  Multiple Vitamins-Minerals (CERTAVITE/ANTIOXIDANTS) LIQD 15 mLs by PEG Tube route daily at 12 noon.    Yes [provider]  pantoprazole sodium (PROTONIX) 40 mg/20 mL PACK 40 mg by PEG Tube route daily at 12 noon.  11/30/11  Yes [name], MD  PARoxetine (PAXIL) 20 MG tablet Take 20 mg by mouth daily. 06/03/17  Yes [provider]  polyethylene glycol (MIRALAX / GLYCOLAX) packet 17 g by PEG Tube route 2 (two) times daily. Mix 17gm  In 4-8 ounces of liquid   Yes [provider]  polyvinyl alcohol (LIQUIFILM TEARS) 1.4 % ophthalmic solution Place 1 drop into both eyes 4 (four) times daily. 9am, 2pm, 6pm, 9pm   Yes [provider]  risperiDONE (RISPERDAL M-TABS) 2 MG disintegrating tablet Place 2 mg under the tongue 2 (two) times daily. 05/08/17  Yes [provider]  sennosides  (SENOKOT) 8.8 MG/5ML syrup Take 15 mLs by mouth 2 (two) times daily.   Yes [provider]  traZODone (DESYREL) 50 MG tablet Take 50 mg by mouth 2 (two) times daily.  08/02/13  Yes [provider]  cefdinir (OMNICEF) 125 MG/5ML suspension Place 12 mLs (300 mg total) into feeding tube 2 (two) times daily. Patient not taking: Reported on 01/25/2018 10/07/17   Jerald Kief, MD  Nutritional Supplements (VITAL 1.5 CAL PO) Take 237 mLs by mouth as needed (For less than 50% food consumption.).    [provider]  predniSONE (DELTASONE) 5 MG tablet Take 1 tablet (5 mg total) by mouth daily with breakfast. Patient not taking: Reported on 01/25/2018 10/07/17   Jerald Kief, MD    Physical Exam: Vitals:   01/25/18 1720 01/25/18 2006  BP: 132/84 125/76  Pulse: 86 86  Resp: 15 20  Temp: 99.1 F (37.3 C) 99.4 F (37.4 C)  TempSrc: Oral Rectal  SpO2: 97% 100%    Constitutional: NAD, calm, comfortable Vitals:   01/25/18 1720 01/25/18 2006  BP: 132/84 125/76  Pulse: 86  86  Resp: 15 20  Temp: 99.1 F (37.3 C) 99.4 F (37.4 C)  TempSrc: Oral Rectal  SpO2: 97% 100%   Eyes: PERRL, lids and conjunctivae normal ENMT: Mucous membranes are moist. Posterior pharynx clear of any exudate or lesions Neck: normal, supple, no masses Respiratory: clear to auscultation bilaterally, no wheezing, no crackles. Normal respiratory effort. No accessory muscle use.  Cardiovascular: Regular rate and rhythm, no murmurs / rubs / gallops. No extremity edema. 2+ pedal pulses. No carotid bruits.   Abdomen: obese, large abscess of superior abdominal wall with overlying erythema, PEG on left upper abdomen with normal appearance, prior PEG site noted on abdomen as well without drainage Musculoskeletal: no clubbing / cyanosis. Normal muscle tone. Skin: no rashes, lesions, ulcers. No induration Psychiatric: Normal judgment and insight. Alert and oriented. Normal mood.   Labs on Admission: I  have personally reviewed following labs and imaging studies  CBC: Recent Labs  Lab 01/25/18 1755  WBC 8.3  NEUTROABS 5.3  HGB 12.6*  HCT 39.0  MCV 83.7  PLT 174   Basic Metabolic Panel: Recent Labs  Lab 01/25/18 1755  NA 142  K 4.6  CL 107  CO2 26  GLUCOSE 92  BUN 15  CREATININE 1.05  CALCIUM 8.9   GFR: CrCl cannot be calculated (Unknown ideal weight.). Liver Function Tests: Recent Labs  Lab 01/25/18 1755  AST 15  ALT 9*  ALKPHOS 71  BILITOT 0.1*  PROT 7.3  ALBUMIN 3.0*   No results for input(s): LIPASE, AMYLASE in the last 168 hours. No results for input(s): AMMONIA in the last 168 hours. Coagulation Profile: No results for input(s): INR, PROTIME in the last 168 hours. Cardiac Enzymes: No results for input(s): CKTOTAL, CKMB, CKMBINDEX, TROPONINI in the last 168 hours. BNP (last 3 results) No results for input(s): PROBNP in the last 8760 hours. HbA1C: No results for input(s): HGBA1C in the last 72 hours. CBG: No results for input(s): GLUCAP in the last 168 hours. Lipid Profile: No results for input(s): CHOL, HDL, LDLCALC, TRIG, CHOLHDL, LDLDIRECT in the last 72 hours. Thyroid Function Tests: No results for input(s): TSH, T4TOTAL, FREET4, T3FREE, THYROIDAB in the last 72 hours. Anemia Panel: No results for input(s): VITAMINB12, FOLATE, FERRITIN, TIBC, IRON, RETICCTPCT in the last 72 hours. Urine analysis:    Component Value Date/Time   COLORURINE AMBER (A) 10/02/2017 2325   APPEARANCEUR CLOUDY (A) 10/02/2017 2325   LABSPEC 1.020 10/02/2017 2325   PHURINE 5.0 10/02/2017 2325   GLUCOSEU NEGATIVE 10/02/2017 2325   HGBUR SMALL (A) 10/02/2017 2325   BILIRUBINUR NEGATIVE 10/02/2017 2325   KETONESUR 5 (A) 10/02/2017 2325   PROTEINUR 100 (A) 10/02/2017 2325   UROBILINOGEN 0.2 01/30/2015 1059   NITRITE POSITIVE (A) 10/02/2017 2325   LEUKOCYTESUR LARGE (A) 10/02/2017 2325    Radiological Exams on Admission: Ct Abdomen Pelvis W Contrast  Result Date:  01/25/2018 CLINICAL DATA:  Abscess near G-tube insertion site. Redness/swelling. History of right nephrectomy, peg tube placement, AKI EXAM: CT ABDOMEN AND PELVIS WITH CONTRAST TECHNIQUE: Multidetector CT imaging of the abdomen and pelvis was performed using the standard protocol following bolus administration of intravenous contrast. CONTRAST:  100mL ISOVUE-300 IOPAMIDOL (ISOVUE-300) INJECTION 61% COMPARISON:  CT abdomen dated 10/04/2017. FINDINGS: Lower chest: No acute abnormality. Hepatobiliary: No focal liver abnormality is seen. Status post cholecystectomy. No biliary dilatation. Pancreas: Unremarkable. No pancreatic ductal dilatation or surrounding inflammatory changes. Spleen: Normal in size without focal abnormality. Adrenals/Urinary Tract: Right kidney is surgically absent. Adrenal  glands appear normal. Stable hypodense mass within the upper pole of the left kidney, again measuring 2.3 cm, described as benign on earlier MRI abdomen. No left-sided hydronephrosis or perinephric fluid. No ureteral or bladder calculi. Stomach/Bowel: No dilated large or small bowel loops. No bowel wall thickening or evidence of bowel wall inflammation seen. Peg tube appears appropriately positioned in the stomach. Vascular/Lymphatic: Mild aortic atherosclerosis. No enlarged lymph nodes seen. Reproductive: Prostate is unremarkable. Other: Lobular abscess collection within the subcutaneous soft tissues of the anterior abdominal wall, supraumbilical, measuring 8.7 x 6 x 6.1 cm (transverse by AP by craniocaudal dimensions). The abscess collection involves the underlying anterior abdominal wall rectus musculature but does not extend into the intraperitoneal abdomen. No intraperitoneal free fluid or abscess collection. No free intraperitoneal air. 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 No acute or suspicious osseous finding.  IMPRESSION: 1. Anterior abdominal wall abscess collection, supraumbilical near the midline, measuring 8.7 x 6 x 6.1 cm, centered within the subcutaneous soft tissues but extending into the underlying anterior abdominal wall rectus musculature. No intraperitoneal extension. 2. The abscess collection within the anterior abdominal wall is located approximately 3 cm to the right of the PEG tube insertion site. The abscess does not appear to involve the PEG tube insertion site. 3. Peg tube appears adequately positioned in the stomach. 4. Right kidney is surgically absent. 5. Aortic atherosclerosis. 6. 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 Electronically Signed   By: Bary Richard M.D.   On: 01/25/2018 20:18    Assessment/Plan Active Problems:   Abdominal wall abscess  Abdominal wall abscess - General Surgery consulted in ED - Started vancomycin; Surgery would also like to add meropenem - NPO for planned operative intervention in AM - Follow up culture data to tailor Abx - Trend fever curve, daily CBC - Appreciate Pharmacy input regarding antibiotic dosing  History of adrenal insufficiency - Patient recently completed steroid taper after last hospitalization - Obtain morning cortisol - Would consider stress dose steroids perioperatively  Chronic medical conditions: - Anoxic brain injury with behavioral disturbance: continue Depakote, Risperdal per SNF - Seizure disorder: continue Depakote, avoid medications that lower seizure threshold - CAD: continue Lopressor; not on aspirin or statin, consider resuming postoperatively - Depression: continue Paxil, trazodone - PEG dependence: holding tube feeds perioperatively, resume when able - Hypothyroidism: continue Synthroid  DVT prophylaxis: Lovenox Code Status: Full Disposition Plan: SNF in 3-5 days Consults called: General Surgery (Dr. Donell Beers) Admission status:  Inpatient   Adam Laney Pastor MD [facility]  If 7PM-7AM, please contact night-coverage [email or web address] Password [facility]  01/25/2018, 11:31 PM

## 2018-01-25 NOTE — ED Notes (Signed)
Bed: [id number] Expected date:  Expected time:  Means of arrival:  Comments: EMS-abscess on stomach

## 2018-01-25 NOTE — ED Provider Notes (Signed)
[facility] Provider Note   CSN: [id number] Arrival date & time: 01/25/18  1708     History   Chief Complaint Chief Complaint  Patient presents with  . Abscess    HPI [name] is a 52 y.o. male with a history of anoxic brain injury, addison disease, a-fib, seizures, MI, who presents today for evaluation from [facility] for concern of an abscess on stomach.  I spoke with his nurse who reports that since the 26th he has been on 100 mg doxycycline twice daily for a possible abscess/infection near his PEG site.  They report that it has been gradually worsening and the facility health care provider felt he needed evaluation here today.  She reports that at baseline he is normally nonverbal or minimally verbal, denies any recent behavior changes from him.  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  No fevers have been noted at the facility.    HPI  Past Medical History:  Diagnosis Date  . A-fib (HCC)   . Addison disease (HCC)   . Addison disease (HCC)   . Anoxic brain injury (HCC)   . Anxiety   . Aspiration pneumonia (HCC)   . Atrial fibrillation (HCC)   . Chronic constipation 11/23/2011  . Dysphagia   . Dysphagia   . Encephalopathy   . Glaucoma   . Glucocorticoid deficiency (HCC)   . History of recurrent UTIs   . Hyperlipemia   . Hypothyroidism   . Mental disorder   . Myocardial infarction (HCC)   . Pneumoperitoneum of unknown etiology 12/21/2011  . Quadriplegia (HCC)   . Reflux   . Seizure disorder [facility](HCC)     Patient Active Problem List   Diagnosis Date Noted  . Abdominal wall abscess 01/25/2018  . Fracture of humerus anatomical neck, right, closed, initial encounter 10/03/2017  . AKI (acute kidney injury) (HCC) 10/03/2017  . UTI (urinary tract infection) 10/03/2017  . Agitation 07/03/2014  . Cardiomyopathy, ischemic 06/18/2014  . 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 Acute  psychosis (HCC) 04/19/2014  . Unspecified hypothyroidism 05/16/2013  . Constipation 05/16/2013  . Anemia of other chronic disease 05/16/2013  . Mental disorder 01/03/2013  . Addisons disease (HCC) 01/03/2013  . PEG (percutaneous endoscopic gastrostomy) adjustment/replacement/removal (HCC) 12/21/2011  . Hypokalemia 11/25/2011  . Thrombocytopenia (HCC) 11/25/2011  . FTT (failure to thrive) in adult 11/25/2011  . Grand mal seizure (HCC) 11/23/2011  . Depression (emotion) 11/23/2011  . History of atrial fibrillation 11/23/2011  . Other specified hemorrhagic conditions (HCC) 11/23/2011  . Glaucoma 11/23/2011  . Hypoglycemia 11/23/2011  . Chronic constipation 11/23/2011  . Anoxic brain injury (HCC) 11/12/2011  . Protein calorie malnutrition (HCC) 11/12/2011  . Hyperchloremia 11/12/2011  . Hyperthyroidism 11/12/2011  . Acute lower UTI 11/12/2011  . Dysphagia     Past Surgical History:  Procedure Laterality Date  . IR GENERIC HISTORICAL  10/20/2016   IR REPLC GASTRO/COLONIC TUBE PERCUT W/FLUORO 10/20/2016 Oley Balmaniel Hassell, MD [facility]  . IR REPLC GASTRO/COLONIC TUBE PERCUT W/FLUORO  04/21/2017  . IR REPLC GASTRO/COLONIC TUBE PERCUT W/FLUORO  07/22/2017  . IR REPLC GASTRO/COLONIC TUBE PERCUT W/FLUORO  11/24/2017  . NEPHRECTOMY  unknown  . PEG TUBE PLACEMENT         Home Medications    Prior to Admission medications   Medication Sig Start Date End Date Taking? Authorizing Provider  acetaminophen (TYLENOL) 325 MG tablet Place 650 mg  into feeding tube every 4 (four) hours as needed for mild pain.   Yes [provider]  divalproex (DEPAKOTE SPRINKLE) 125 MG capsule Take 500 mg by mouth 3 (three) times daily.  07/18/13  Yes [provider]  docusate sodium (COLACE) 100 MG capsule Take 100 mg by mouth 2 (two) times daily.   Yes [provider]  levothyroxine (SYNTHROID, LEVOTHROID) 200 MCG tablet Take 200 mcg by mouth daily before breakfast.   Yes [provider]  loratadine (CLARITIN) 10 MG tablet 10 mg by PEG Tube route daily.    Yes [provider]  METOPROLOL SUCCINATE ER PO 12.5 mg by PEG Tube route 2 (two) times daily. Metoprolol tartrate 6.25 mg/5 ml suspension   Yes [provider]  Multiple Vitamins-Minerals (CERTAVITE/ANTIOXIDANTS) LIQD 15 mLs by PEG Tube route daily at 12 noon.    Yes [provider]  pantoprazole sodium (PROTONIX) 40 mg/20 mL PACK 40 mg by PEG Tube route daily at 12 noon.  11/30/11  Yes [name], MD  PARoxetine (PAXIL) 20 MG tablet Take 20 mg by mouth daily. 06/03/17  Yes [provider]  polyethylene glycol (MIRALAX / GLYCOLAX) packet 17 g by PEG Tube route 2 (two) times daily. Mix 17gm  In 4-8 ounces of liquid   Yes [provider]  polyvinyl alcohol (LIQUIFILM TEARS) 1.4 % ophthalmic solution Place 1 drop into both eyes 4 (four) times daily. 9am, 2pm, 6pm, 9pm   Yes [provider]  risperiDONE (RISPERDAL M-TABS) 2 MG disintegrating tablet Place 2 mg under the tongue 2 (two) times daily. 05/08/17  Yes [provider]  sennosides (SENOKOT) 8.8 MG/5ML syrup Take 15 mLs by mouth 2 (two) times daily.   Yes [provider]  traZODone (DESYREL) 50 MG tablet Take 50 mg by mouth 2 (two) times daily.  08/02/13  Yes [provider]  cefdinir (OMNICEF) 125 MG/5ML suspension Place 12 mLs (300 mg total) into feeding tube 2 (two) times daily. Patient not taking: Reported on 01/25/2018 10/07/17   Jerald Kief, MD  Nutritional Supplements (VITAL 1.5 CAL PO) Take 237 mLs by mouth as needed (For less than 50% food consumption.).    [provider]  predniSONE (DELTASONE) 5 MG tablet Take 1 tablet (5 mg total) by mouth daily with breakfast. Patient not taking: Reported on 01/25/2018 10/07/17   Jerald Kief, MD    Family History History reviewed. No pertinent family history.  Social History Social History   Tobacco Use  . Smoking  status: Never Smoker  . Smokeless tobacco: Never Used  Substance Use Topics  . Alcohol use: No  . Drug use: No     Allergies   Ativan [lorazepam]; Latex; Morphine and related; Penicillins; Pyridium [phenazopyridine hcl]; and Soy allergy   Review of Systems Review of Systems  Unable to perform ROS: Patient nonverbal     Physical Exam Updated Vital Signs BP 113/76 (BP Location: Left Arm)   Pulse 64   Temp 97.8 F (36.6 C) (Oral)   Resp 18   Wt 119 kg (262 lb 5.6 oz)   SpO2 96%   BMI 36.59 kg/m   Physical Exam  Constitutional: He appears well-developed and well-nourished.  HENT:  Head: Normocephalic and atraumatic.  Eyes: Conjunctivae are normal.  Neck: Neck supple.  Cardiovascular: Normal rate, regular rhythm and intact distal pulses.  No murmur heard. Pulmonary/Chest: Effort normal and breath sounds normal. No respiratory distress.  Abdominal: Bowel sounds are  normal.  There is a large area of induration and fluctuance with associated swelling on anterior abdomen medial to his feeding tube.  There is green, thick drainage around the feeding tube.  Inferior to mass there is a dressing with packing in place, there is scant drainage.    Musculoskeletal: He exhibits no edema.  Neurological: He is alert.  Patient is non verbal, does not move extremities, eyes open to voice.   Skin: Skin is warm and dry.  Psychiatric: He has a normal mood and affect.  Nursing note and vitals reviewed.          ED Treatments / Results  Labs (all labs ordered are listed, but only abnormal results are displayed) Labs Reviewed  BLOOD CULTURE ID PANEL (REFLEXED) - Abnormal; Notable for the following components:      Result Value   Staphylococcus species DETECTED (*)    Methicillin resistance DETECTED (*)    All other components within normal limits  CBC WITH DIFFERENTIAL/PLATELET - Abnormal; Notable for the following components:   Hemoglobin 12.6 (*)    All other components  within normal limits  COMPREHENSIVE METABOLIC PANEL - Abnormal; Notable for the following components:   Albumin 3.0 (*)    ALT 9 (*)    Total Bilirubin 0.1 (*)    All other components within normal limits  CORTISOL-AM, BLOOD - Abnormal; Notable for the following components:   Cortisol - AM 5.3 (*)    All other components within normal limits  MAGNESIUM - Abnormal; Notable for the following components:   Magnesium 1.6 (*)    All other components within normal limits  PREALBUMIN - Abnormal; Notable for the following components:   Prealbumin 11.8 (*)    All other components within normal limits  TSH - Abnormal; Notable for the following components:   TSH 0.338 (*)    All other components within normal limits  BASIC METABOLIC PANEL - Abnormal; Notable for the following components:   Calcium 8.6 (*)    All other components within normal limits  CBC - Abnormal; Notable for the following components:   Hemoglobin 11.3 (*)    HCT 35.4 (*)    All other components within normal limits  CBG MONITORING, ED - Abnormal; Notable for the following components:   Glucose-Capillary 112 (*)    All other components within normal limits  CULTURE, BLOOD (ROUTINE X 2)  CULTURE, BLOOD (ROUTINE X 2)  SURGICAL PCR SCREEN  PHOSPHORUS  APTT  PROTIME-INR  I-STAT CG4 LACTIC ACID, ED  CBG MONITORING, ED    EKG  EKG Interpretation None       Radiology Ct Abdomen Pelvis W Contrast  Result Date: 01/25/2018 CLINICAL DATA:  Abscess near G-tube insertion site. Redness/swelling. History of right nephrectomy, peg tube placement, AKI EXAM: CT ABDOMEN AND PELVIS WITH CONTRAST TECHNIQUE: Multidetector CT imaging of the abdomen and pelvis was performed using the standard protocol following bolus administration of intravenous contrast. CONTRAST:  ISOVUE-300 IOPAMIDOL (ISOVUE-300) INJECTION 61% COMPARISON:  CT abdomen dated 10/04/2017. FINDINGS: Lower chest: No acute abnormality. Hepatobiliary: No focal liver  abnormality is seen. Status post cholecystectomy. No biliary dilatation. Pancreas: Unremarkable. No pancreatic ductal dilatation or surrounding inflammatory changes. Spleen: Normal in size without focal abnormality. Adrenals/Urinary Tract: Right kidney is surgically absent. Adrenal glands appear normal. Stable hypodense mass within the upper pole of the left kidney, again measuring 2.3 cm, described as benign on earlier MRI abdomen. No left-sided hydronephrosis or perinephric fluid. No ureteral or bladder  calculi. Stomach/Bowel: No dilated large or small bowel loops. No bowel wall thickening or evidence of bowel wall inflammation seen. Peg tube appears appropriately positioned in the stomach. Vascular/Lymphatic: Mild aortic atherosclerosis. No enlarged lymph nodes seen. Reproductive: Prostate is unremarkable. Other: Lobular abscess collection within the subcutaneous soft tissues of the anterior abdominal wall, supraumbilical, measuring 8.7 x 6 x 6.1 cm (transverse by AP by craniocaudal dimensions). The abscess collection involves the underlying anterior abdominal wall rectus musculature but does not extend into the intraperitoneal abdomen. No intraperitoneal free fluid or abscess collection. No free intraperitoneal air. 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 No acute or suspicious osseous finding. IMPRESSION: 1. Anterior abdominal wall abscess collection, supraumbilical near the midline, measuring 8.7 x 6 x 6.1 cm, centered within the subcutaneous soft tissues but extending into the underlying anterior abdominal wall rectus musculature. No intraperitoneal extension. 2. The abscess collection within the anterior abdominal wall is located approximately 3 cm to the right of the PEG tube insertion site. The abscess does not appear to involve the PEG tube insertion site. 3. Peg tube appears adequately positioned in the  stomach. 4. Right kidney is surgically absent. 5. Aortic atherosclerosis. 6. 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 Electronically Signed   By: Bary Richard M.D.   On: 01/25/2018 20:18    Procedures Procedures (including critical care time)  Medications Ordered in ED Medications  iopamidol (ISOVUE-300) 61 % injection (100 mLs  Contrast Given 01/25/18 1936)  vancomycin (VANCOCIN) 2,000 mg in sodium chloride 0.9 % 500 mL IVPB (0 mg Intravenous Stopped 01/26/18 0127)     Initial Impression / Assessment and Plan / ED Course  I have reviewed the triage vital signs and the nursing notes.  Pertinent labs & imaging results that were available during my care of the patient were reviewed by me and considered in my medical decision making (see chart for details).  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    [EH]  2141 Spoke with hospitalist who will admit.    [EH]    Clinical Course User Index [EH] Cristina Gong, PA-C   Steven Binet presents today for evaluation of erythema and swelling on his abdomen.  He is essentially nonverbal at baseline, status post anoxic brain injury.  I spoke with RN at his nursing facility who reports that he has been on doxycycline for approximately 3 days and continues getting worse.  CT abdomen with contrast was obtained showing a large mass consistent with abscess on anterior abdominal wall, involving abdominal wall musculature.  General surgery was consulted, who stated that they would come and see the patient.  Patient does not meet Sirs or sepsis criteria, he was started on vancomycin in the emergency room.  Patient  is primarily fed by PEG tube, hold feeds/n.p.o. pending probable surgical procedure.  Screening labs were obtained and reviewed.  I spoke with hospitalist who agrees to admit patient.  This patient was seen as a shared visit with my supervising physician who evaluated the patient and agreed with  my plan.    The patient appears reasonably stabilized for admission considering the current resources, flow, and capabilities available in the ED at this time, and I doubt any other [facility] requiring further screening and/or treatment in the ED prior to admission.    Final Clinical Impressions(s) / ED Diagnoses   Final diagnoses:  Abscess    ED Discharge Orders    None       Norman Clay 01/26/18 1731    Wynetta Fines, MD 01/26/18 [phone]
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
## 2018-01-25 NOTE — ED Triage Notes (Signed)
Transported from [facility]--abscess noted to abdomen near G tube insertion site. EMS reports redness/swelling. VSS with PTAR.

## 2018-01-26 ENCOUNTER — Encounter (HOSPITAL_COMMUNITY)
Admission: EM | Disposition: A | Discharge status: Skilled Nursing Facility | Payer: Self-pay | Source: Home / Self Care | Attending: Family Medicine

## 2018-01-26 ENCOUNTER — Other Ambulatory Visit: Payer: Self-pay

## 2018-01-26 ENCOUNTER — Encounter (HOSPITAL_COMMUNITY): Payer: Self-pay | Admitting: Certified Registered Nurse Anesthetist

## 2018-01-26 ENCOUNTER — Inpatient Hospital Stay (HOSPITAL_COMMUNITY): Payer: Medicare Other | Admitting: Certified Registered Nurse Anesthetist

## 2018-01-26 DIAGNOSIS — Z7401 Bed confinement status: Secondary | ICD-10-CM

## 2018-01-26 DIAGNOSIS — T8579XA Infection and inflammatory reaction due to other internal prosthetic devices, implants and grafts, initial encounter: Secondary | ICD-10-CM

## 2018-01-26 DIAGNOSIS — Z931 Gastrostomy status: Secondary | ICD-10-CM

## 2018-01-26 DIAGNOSIS — G8250 Quadriplegia, unspecified: Secondary | ICD-10-CM | POA: Diagnosis present

## 2018-01-26 DIAGNOSIS — L02211 Cutaneous abscess of abdominal wall: Secondary | ICD-10-CM

## 2018-01-26 HISTORY — PX: IRRIGATION AND DEBRIDEMENT ABSCESS: SHX5252

## 2018-01-26 HISTORY — PX: HERNIA MESH REMOVAL: SHX1745

## 2018-01-26 HISTORY — PX: DEBRIDEMENT OF ABDOMINAL WALL ABSCESS: SHX6396

## 2018-01-26 LAB — BLOOD CULTURE ID PANEL (REFLEXED)
ACINETOBACTER BAUMANNII: NOT DETECTED
CANDIDA ALBICANS: NOT DETECTED
Candida glabrata: NOT DETECTED
Candida krusei: NOT DETECTED
Candida parapsilosis: NOT DETECTED
Candida tropicalis: NOT DETECTED
ENTEROBACTERIACEAE SPECIES: NOT DETECTED
Enterobacter cloacae complex: NOT DETECTED
Enterococcus species: NOT DETECTED
Escherichia coli: NOT DETECTED
HAEMOPHILUS INFLUENZAE: NOT DETECTED
KLEBSIELLA OXYTOCA: NOT DETECTED
Klebsiella pneumoniae: NOT DETECTED
LISTERIA MONOCYTOGENES: NOT DETECTED
Methicillin resistance: DETECTED — AB
NEISSERIA MENINGITIDIS: NOT DETECTED
PSEUDOMONAS AERUGINOSA: NOT DETECTED
Proteus species: NOT DETECTED
STREPTOCOCCUS AGALACTIAE: NOT DETECTED
STREPTOCOCCUS PNEUMONIAE: NOT DETECTED
STREPTOCOCCUS PYOGENES: NOT DETECTED
STREPTOCOCCUS SPECIES: NOT DETECTED
Serratia marcescens: NOT DETECTED
Staphylococcus aureus (BCID): NOT DETECTED
Staphylococcus species: DETECTED — AB

## 2018-01-26 LAB — CORTISOL-AM, BLOOD: Cortisol - AM: 5.3 ug/dL — ABNORMAL LOW (ref 6.7–22.6)

## 2018-01-26 LAB — CBG MONITORING, ED
GLUCOSE-CAPILLARY: 112 mg/dL — AB (ref 65–99)
Glucose-Capillary: 66 mg/dL (ref 65–99)

## 2018-01-26 LAB — GLUCOSE, CAPILLARY
GLUCOSE-CAPILLARY: 135 mg/dL — AB (ref 65–99)
Glucose-Capillary: 145 mg/dL — ABNORMAL HIGH (ref 65–99)

## 2018-01-26 LAB — BASIC METABOLIC PANEL
Anion gap: 6 (ref 5–15)
BUN: 13 mg/dL (ref 6–20)
CO2: 27 mmol/L (ref 22–32)
Calcium: 8.6 mg/dL — ABNORMAL LOW (ref 8.9–10.3)
Chloride: 109 mmol/L (ref 101–111)
Creatinine, Ser: 0.87 mg/dL (ref 0.61–1.24)
GFR calc Af Amer: >60 mL/min (ref >60)
GLUCOSE: 80 mg/dL (ref 65–99)
Potassium: 3.7 mmol/L (ref 3.5–5.1)
Sodium: 142 mmol/L (ref 135–145)

## 2018-01-26 LAB — MAGNESIUM: Magnesium: 1.6 mg/dL — ABNORMAL LOW (ref 1.7–2.4)

## 2018-01-26 LAB — CBC
HCT: 35.4 % — ABNORMAL LOW (ref 39.0–52.0)
HEMOGLOBIN: 11.3 g/dL — AB (ref 13.0–17.0)
MCH: 26.4 pg (ref 26.0–34.0)
MCHC: 31.9 g/dL (ref 30.0–36.0)
MCV: 82.7 fL (ref 78.0–100.0)
Platelets: 162 10*3/uL (ref 150–400)
RBC: 4.28 MIL/uL (ref 4.22–5.81)
RDW: 15.1 % (ref 11.5–15.5)
WBC: 8.6 10*3/uL (ref 4.0–10.5)

## 2018-01-26 LAB — SURGICAL PCR SCREEN
MRSA, PCR: NEGATIVE
Staphylococcus aureus: NEGATIVE

## 2018-01-26 LAB — PHOSPHORUS: Phosphorus: 2.9 mg/dL (ref 2.5–4.6)

## 2018-01-26 LAB — TSH: TSH: 0.338 u[IU]/mL — AB (ref 0.350–4.500)

## 2018-01-26 LAB — PREALBUMIN: Prealbumin: 11.8 mg/dL — ABNORMAL LOW (ref 18–38)

## 2018-01-26 SURGERY — IRRIGATION AND DEBRIDEMENT ABSCESS
Anesthesia: General | Site: Abdomen

## 2018-01-26 MED ORDER — DEXAMETHASONE SODIUM PHOSPHATE 4 MG/ML IJ SOLN
INTRAMUSCULAR | Status: DC | PRN
  Administered 2018-01-26: 10 mg via INTRAVENOUS

## 2018-01-26 MED ORDER — FENTANYL CITRATE (PF) 100 MCG/2ML IJ SOLN
INTRAMUSCULAR | Status: AC
  Filled 2018-01-26: qty 2

## 2018-01-26 MED ORDER — VANCOMYCIN HCL 10 G IV SOLR
1750.0000 mg | Freq: Two times a day (BID) | INTRAVENOUS | Status: DC
  Filled 2018-01-26: qty 1750

## 2018-01-26 MED ORDER — ONDANSETRON HCL 4 MG/2ML IJ SOLN: 4.0000 mg | Freq: Once | INTRAMUSCULAR | Status: DC | PRN

## 2018-01-26 MED ORDER — PROPOFOL 10 MG/ML IV BOLUS
INTRAVENOUS | Status: AC
Start: 2018-01-26 — End: 2018-01-26
  Filled 2018-01-26: qty 20

## 2018-01-26 MED ORDER — LACTATED RINGERS IV SOLN
INTRAVENOUS | Status: DC
  Administered 2018-01-26: 15:00:00 via INTRAVENOUS

## 2018-01-26 MED ORDER — VANCOMYCIN HCL 10 G IV SOLR
1250.0000 mg | Freq: Two times a day (BID) | INTRAVENOUS | Status: DC
  Administered 2018-01-26 – 2018-01-30 (×7): 1250 mg via INTRAVENOUS
  Filled 2018-01-26 (×9): qty 1250

## 2018-01-26 MED ORDER — LIDOCAINE 2% (20 MG/ML) 5 ML SYRINGE
INTRAMUSCULAR | Status: DC | PRN
  Administered 2018-01-26: 80 mg via INTRAVENOUS

## 2018-01-26 MED ORDER — OXYCODONE HCL 5 MG/5ML PO SOLN
5.0000 mg | Freq: Once | ORAL | Status: DC | PRN
  Filled 2018-01-26: qty 5

## 2018-01-26 MED ORDER — JEVITY 1.2 CAL PO LIQD
1000.0000 mL | ORAL | Status: DC
  Administered 2018-01-26: 1000 mL
  Filled 2018-01-26: qty 1000

## 2018-01-26 MED ORDER — HYDROCORTISONE NA SUCCINATE PF 100 MG IJ SOLR
50.0000 mg | Freq: Three times a day (TID) | INTRAMUSCULAR | Status: AC
  Administered 2018-01-26 (×2): 50 mg via INTRAVENOUS
  Filled 2018-01-26 (×4): qty 1

## 2018-01-26 MED ORDER — LACTATED RINGERS IV SOLN: INTRAVENOUS | Status: DC | PRN

## 2018-01-26 MED ORDER — DEXTROSE-NACL 5-0.45 % IV SOLN
INTRAVENOUS | Status: DC
  Administered 2018-01-26 – 2018-01-28 (×3): via INTRAVENOUS

## 2018-01-26 MED ORDER — PROPOFOL 10 MG/ML IV BOLUS
INTRAVENOUS | Status: AC
  Filled 2018-01-26: qty 20

## 2018-01-26 MED ORDER — LACTATED RINGERS IV SOLN: INTRAVENOUS | Status: DC

## 2018-01-26 MED ORDER — DEXTROSE 50 % IV SOLN
1.0000 | Freq: Once | INTRAVENOUS | Status: AC
  Administered 2018-01-26: 50 mL via INTRAVENOUS
  Filled 2018-01-26: qty 50

## 2018-01-26 MED ORDER — ONDANSETRON HCL 4 MG/2ML IJ SOLN
INTRAMUSCULAR | Status: AC
  Filled 2018-01-26: qty 2

## 2018-01-26 MED ORDER — MIDAZOLAM HCL 2 MG/2ML IJ SOLN
INTRAMUSCULAR | Status: AC
  Filled 2018-01-26: qty 2

## 2018-01-26 MED ORDER — FENTANYL CITRATE (PF) 100 MCG/2ML IJ SOLN
INTRAMUSCULAR | Status: DC | PRN
  Administered 2018-01-26 (×2): 25 ug via INTRAVENOUS
  Administered 2018-01-26: 50 ug via INTRAVENOUS
  Administered 2018-01-26: 25 ug via INTRAVENOUS

## 2018-01-26 MED ORDER — 0.9 % SODIUM CHLORIDE (POUR BTL) OPTIME
TOPICAL | Status: DC | PRN
  Administered 2018-01-26: 2000 mL

## 2018-01-26 MED ORDER — LACTATED RINGERS IV SOLN
INTRAVENOUS | Status: DC | PRN
  Administered 2018-01-26: 18:00:00 via INTRAVENOUS

## 2018-01-26 MED ORDER — LIDOCAINE 2% (20 MG/ML) 5 ML SYRINGE
INTRAMUSCULAR | Status: AC
  Filled 2018-01-26: qty 5

## 2018-01-26 MED ORDER — OXYCODONE HCL 5 MG PO TABS: 5.0000 mg | ORAL_TABLET | Freq: Once | ORAL | Status: DC | PRN

## 2018-01-26 MED ORDER — PROPOFOL 10 MG/ML IV BOLUS
INTRAVENOUS | Status: DC | PRN
  Administered 2018-01-26: 170 mg via INTRAVENOUS

## 2018-01-26 MED ORDER — FENTANYL CITRATE (PF) 100 MCG/2ML IJ SOLN
25.0000 ug | INTRAMUSCULAR | Status: DC | PRN
  Administered 2018-01-26: 25 ug via INTRAVENOUS
  Administered 2018-01-26: 50 ug via INTRAVENOUS
  Administered 2018-01-26: 25 ug via INTRAVENOUS

## 2018-01-26 MED ORDER — ONDANSETRON HCL 4 MG/2ML IJ SOLN
INTRAMUSCULAR | Status: DC | PRN
  Administered 2018-01-26: 4 mg via INTRAVENOUS

## 2018-01-26 MED ORDER — HYDROCORTISONE NA SUCCINATE PF 100 MG IJ SOLR
100.0000 mg | Freq: Once | INTRAMUSCULAR | Status: AC
  Administered 2018-01-26: 100 mg via INTRAVENOUS

## 2018-01-26 MED ORDER — LEVOTHYROXINE SODIUM 75 MCG PO TABS
175.0000 ug | ORAL_TABLET | Freq: Every day | ORAL | Status: DC
  Administered 2018-01-27 – 2018-01-30 (×3): 175 ug via ORAL
  Filled 2018-01-26 (×3): qty 1

## 2018-01-26 MED ORDER — METOCLOPRAMIDE HCL 10 MG/10ML PO SOLN
10.0000 mg | Freq: Four times a day (QID) | ORAL | Status: AC
  Administered 2018-01-26 – 2018-01-28 (×8): 10 mg
  Filled 2018-01-26 (×8): qty 10

## 2018-01-26 SURGICAL SUPPLY — 38 items
BLADE HEX COATED 2.75 (ELECTRODE) ×3 IMPLANT
BLADE SURG SZ10 CARB STEEL (BLADE) ×3 IMPLANT
COVER SURGICAL LIGHT HANDLE (MISCELLANEOUS) ×3 IMPLANT
DECANTER SPIKE VIAL GLASS SM (MISCELLANEOUS) IMPLANT
DERMABOND ADVANCED (GAUZE/BANDAGES/DRESSINGS)
DERMABOND ADVANCED .7 DNX12 (GAUZE/BANDAGES/DRESSINGS) IMPLANT
DRAPE LAPAROSCOPIC ABDOMINAL (DRAPES) IMPLANT
DRAPE LAPAROTOMY T 102X78X121 (DRAPES) IMPLANT
DRAPE LAPAROTOMY TRNSV 102X78 (DRAPE) IMPLANT
DRAPE SHEET LG 3/4 BI-LAMINATE (DRAPES) IMPLANT
ELECT PENCIL ROCKER SW 15FT (MISCELLANEOUS) ×3 IMPLANT
ELECT REM PT RETURN 15FT ADLT (MISCELLANEOUS) ×3 IMPLANT
GAUZE IODOFORM PACK 1/2 7832 (GAUZE/BANDAGES/DRESSINGS) ×3 IMPLANT
GAUZE SPONGE 4X4 12PLY STRL (GAUZE/BANDAGES/DRESSINGS) ×3 IMPLANT
GLOVE BIO SURGEON STRL SZ7 (GLOVE) ×9 IMPLANT
GLOVE BIOGEL PI IND STRL 7.0 (GLOVE) ×1 IMPLANT
GLOVE BIOGEL PI IND STRL 7.5 (GLOVE) ×3 IMPLANT
GLOVE BIOGEL PI INDICATOR 7.0 (GLOVE) ×2
GLOVE BIOGEL PI INDICATOR 7.5 (GLOVE) ×6
GOWN STRL REUS W/ TWL XL LVL3 (GOWN DISPOSABLE) ×1 IMPLANT
GOWN STRL REUS W/TWL LRG LVL3 (GOWN DISPOSABLE) ×6 IMPLANT
GOWN STRL REUS W/TWL XL LVL3 (GOWN DISPOSABLE) ×5 IMPLANT
KIT BASIN OR (CUSTOM PROCEDURE TRAY) ×3 IMPLANT
MARKER SKIN DUAL TIP RULER LAB (MISCELLANEOUS) ×3 IMPLANT
NDL SAFETY ECLIPSE 18X1.5 (NEEDLE) ×1 IMPLANT
NEEDLE HYPO 18GX1.5 SHARP (NEEDLE) ×2
NEEDLE HYPO 25X1 1.5 SAFETY (NEEDLE) ×3 IMPLANT
PACK BASIC VI WITH GOWN DISP (CUSTOM PROCEDURE TRAY) ×3 IMPLANT
SPONGE LAP 18X18 X RAY DECT (DISPOSABLE) IMPLANT
SPONGE LAP 4X18 X RAY DECT (DISPOSABLE) IMPLANT
STAPLER VISISTAT 35W (STAPLE) IMPLANT
SUT MNCRL AB 4-0 PS2 18 (SUTURE) IMPLANT
SUT VIC AB 3-0 SH 18 (SUTURE) IMPLANT
SYR CONTROL 10ML LL (SYRINGE) ×3 IMPLANT
TAPE CLOTH SURG 4X10 WHT LF (GAUZE/BANDAGES/DRESSINGS) ×3 IMPLANT
TOWEL OR 17X26 10 PK STRL BLUE (TOWEL DISPOSABLE) ×3 IMPLANT
TOWEL OR NON WOVEN STRL DISP B (DISPOSABLE) ×3 IMPLANT
YANKAUER SUCT BULB TIP 10FT TU (MISCELLANEOUS) ×3 IMPLANT

## 2018-01-26 NOTE — ED Notes (Signed)
HOSPITALIST PRESENT AWARE OF PT CURRENT STATUS. ORDERS GIVEN

## 2018-01-26 NOTE — ED Notes (Signed)
KRISTI RN 1 [facility] AWARE OF NEED FOR PENDING SIGNING OF CONSENT.MEDICATIONS IN NEED OF ADMINISTERING, CERTAIN MEDICATION ARE ON HOLD, PT FOR SURGERY APPROX . 2PM

## 2018-01-26 NOTE — Interval H&P Note (Signed)
History and Physical Interval Note:  01/26/2018 5:46 PM  [name]  has presented today for surgery, with the diagnosis of abdominal wall abcess  The various methods of treatment have been discussed with the patient and family. After consideration of risks, benefits and other options for treatment, the patient has consented to  Procedure(s): IRRIGATION AND DEBRIDEMENT ABDOMINAL WALL ABSCESS (N/A) as a surgical intervention .  The patient's history has been reviewed, patient examined, no change in status, stable for surgery.  I have reviewed the patient's chart and labs.    I have re-reviewed the the patient's records, history, medications, and allergies.  I have re-examined the patient.  I again discussed intraoperative plans and goals of post-operative recovery.  The patient agrees to proceed.  [name]  1966-11-15 [id number]  Patient Care Team: Wenda Low, MD as PCP - General (Internal Medicine)  Patient Active Problem List   Diagnosis Date Noted  . Abdominal wall abscess 01/25/2018  . Fracture of humerus anatomical neck, right, closed, initial encounter 10/03/2017  . AKI (acute kidney injury) ([place]) 10/03/2017  . UTI (urinary tract infection) 10/03/2017  . Agitation 07/03/2014  . Cardiomyopathy, ischemic 06/18/2014  . 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 Acute psychosis ([place]) 04/19/2014  . Unspecified hypothyroidism 05/16/2013  . Constipation 05/16/2013  . Anemia of other chronic disease 05/16/2013  . Mental disorder 01/03/2013  . Addisons disease ([place]) 01/03/2013  . PEG (percutaneous endoscopic gastrostomy) adjustment/replacement/removal ([place]) 12/21/2011  . Hypokalemia 11/25/2011  . Thrombocytopenia ([place]) 11/25/2011  . FTT (failure to thrive) in adult 11/25/2011  . Grand mal seizure ([place]) 11/23/2011  . Depression (emotion) 11/23/2011  . History of atrial fibrillation 11/23/2011  . Other specified hemorrhagic conditions ([place]) 11/23/2011  . Glaucoma 11/23/2011  .  Hypoglycemia 11/23/2011  . Chronic constipation 11/23/2011  . Anoxic brain injury ([place]) 11/12/2011  . Protein calorie malnutrition ([place]) 11/12/2011  . Hyperchloremia 11/12/2011  . Hyperthyroidism 11/12/2011  . Acute lower UTI 11/12/2011  . Dysphagia     Past Medical History:  Diagnosis Date  . A-fib ([place])   . Addison disease ([place])   . Addison disease ([place])   . Anoxic brain injury ([place])   . Anxiety   . Aspiration pneumonia ([place])   . Atrial fibrillation ([place])   . Chronic constipation 11/23/2011  . Dysphagia   . Dysphagia   . Encephalopathy   . Glaucoma   . Glucocorticoid deficiency ([place])   . History of recurrent UTIs   . Hyperlipemia   . Hypothyroidism   . Mental disorder   . Myocardial infarction ([place])   . Pneumoperitoneum of unknown etiology 12/21/2011  . Quadriplegia ([place])   . Reflux   . Seizure disorder [facility])     Past Surgical History:  Procedure Laterality Date  . IR GENERIC HISTORICAL  10/20/2016   IR [place] TUBE PERCUT W/FLUORO 10/20/2016 Arne Cleveland, MD [facility]-INTERV RAD  . IR [place] TUBE PERCUT W/FLUORO  04/21/2017  . IR [facility] GASTRO/COLONIC TUBE PERCUT W/FLUORO  07/22/2017  . IR [place] PERCUT W/FLUORO  11/24/2017  . NEPHRECTOMY  unknown  . PEG TUBE PLACEMENT      Social History   Socioeconomic History  . Marital status: Divorced    Spouse name: Not on file  . Number of children: Not on file  . Years of education: Not on file  . Highest education level: Not on file  Social Needs  . Financial resource strain: Not on file  . Food insecurity - worry:  Not on file  . Food insecurity - inability: Not on file  . Transportation needs - medical: Not on file  . Transportation needs - non-medical: Not on file  Occupational History  . Not on file  Tobacco Use  . Smoking status: Never Smoker  . Smokeless tobacco: Never Used  Substance and Sexual Activity  . Alcohol use: No  . Drug use: No  . Sexual activity: No   Other Topics Concern  . Not on file  Social History Narrative  . Not on file    History reviewed. No pertinent family history.  Medications Prior to Admission  Medication Sig Dispense Refill Last Dose  . acetaminophen (TYLENOL) 325 MG tablet Place 650 mg into feeding tube every 4 (four) hours as needed for mild pain.   unknown  . divalproex (DEPAKOTE SPRINKLE) 125 MG capsule Take 500 mg by mouth 3 (three) times daily.    01/25/2018 at 1300  . docusate sodium (COLACE) 100 MG capsule Take 100 mg by mouth 2 (two) times daily.   01/25/2018 at Unknown time  . levothyroxine (SYNTHROID, LEVOTHROID) 200 MCG tablet Take 200 mcg by mouth daily before breakfast.   01/25/2018 at Unknown time  . loratadine (CLARITIN) 10 MG tablet 10 mg by PEG Tube route daily.    01/25/2018 at Unknown time  . METOPROLOL SUCCINATE ER PO 12.5 mg by PEG Tube route 2 (two) times daily. Metoprolol tartrate 6.25 mg/5 ml suspension   01/25/2018 at 0900  . Multiple Vitamins-Minerals (CERTAVITE/ANTIOXIDANTS) LIQD 15 mLs by PEG Tube route daily at 12 noon.    01/25/2018 at Unknown time  . pantoprazole sodium (PROTONIX) 40 mg/20 mL PACK 40 mg by PEG Tube route daily at 12 noon.    01/25/2018 at Unknown time  . PARoxetine (PAXIL) 20 MG tablet Take 20 mg by mouth daily.   01/25/2018 at Unknown time  . polyethylene glycol (MIRALAX / GLYCOLAX) packet 17 g by PEG Tube route 2 (two) times daily. Mix 17gm  In 4-8 ounces of liquid   01/25/2018 at Unknown time  . polyvinyl alcohol (LIQUIFILM TEARS) 1.4 % ophthalmic solution Place 1 drop into both eyes 4 (four) times daily. 9am, 2pm, 6pm, 9pm   01/25/2018 at Unknown time  . risperiDONE (RISPERDAL M-TABS) 2 MG disintegrating tablet Place 2 mg under the tongue 2 (two) times daily.   01/25/2018 at Unknown time  . sennosides (SENOKOT) 8.8 MG/5ML syrup Take 15 mLs by mouth 2 (two) times daily.   01/25/2018 at Unknown time  . traZODone (DESYREL) 50 MG tablet Take 50 mg by mouth 2 (two) times daily.     01/25/2018 at Unknown time  . cefdinir (OMNICEF) 125 MG/5ML suspension Place 12 mLs (300 mg total) into feeding tube 2 (two) times daily. (Patient not taking: Reported on 01/25/2018)   Not Taking at Unknown time  . Nutritional Supplements (VITAL 1.5 CAL PO) Take 237 mLs by mouth as needed (For less than 50% food consumption.).   unknown  . predniSONE (DELTASONE) 5 MG tablet Take 1 tablet (5 mg total) by mouth daily with breakfast. (Patient not taking: Reported on 01/25/2018)   Not Taking at Unknown time    Current Facility-Administered Medications  Medication Dose Route Frequency Provider Last Rate Last Dose  . dextrose 5 %-0.45 % sodium chloride infusion   Intravenous Continuous Roxan Hockey, MD   Stopped at 01/26/18 1719  . [MAR Hold] divalproex (DEPAKOTE SPRINKLE) capsule 500 mg  500 mg Oral TID Bennie Pierini, MD  500 mg at 01/26/18 1605  . [MAR Hold] docusate sodium (COLACE) capsule 100 mg  100 mg Oral BID Bennie Pierini, MD   Stopped at 01/26/18 1012  . [MAR Hold] enoxaparin (LOVENOX) injection 40 mg  40 mg Subcutaneous QHS Bennie Pierini, MD   40 mg at 01/26/18 0230  . [MAR Hold] hydrocortisone sodium succinate (SOLU-CORTEF) 100 MG injection 50 mg  50 mg Intravenous Q8H Emokpae, Courage, MD   50 mg at 01/26/18 1626  . [MAR Hold] levothyroxine (SYNTHROID, LEVOTHROID) tablet 175 mcg  175 mcg Oral QAC breakfast Emokpae, Courage, MD      . Doug Sou Hold] meropenem (MERREM) 1 g in sodium chloride 0.9 % 100 mL IVPB  1 g Intravenous Q8H Stark Klein, MD   Stopped at 01/26/18 1449  . [MAR Hold] metoprolol tartrate (LOPRESSOR) 25 mg/10 mL oral suspension 12.5 mg  12.5 mg Per Tube BID Bennie Pierini, MD   12.5 mg at 01/26/18 1303  . [MAR Hold] multivitamin liquid 15 mL  15 mL Per Tube Q1200 Bennie Pierini, MD   Stopped at 01/26/18 1256  . [MAR Hold] pantoprazole sodium (PROTONIX) 40 mg/20 mL oral suspension 40 mg  40 mg Per Tube Q1200 Bennie Pierini, MD   40 mg at 01/26/18 1336   . [MAR Hold] PARoxetine (PAXIL) tablet 20 mg  20 mg Oral Daily Bennie Pierini, MD   Stopped at 01/26/18 1013  . [MAR Hold] polyethylene glycol (MIRALAX / GLYCOLAX) packet 17 g  17 g Oral BID Bennie Pierini, MD   Stopped at 01/26/18 0240  . [MAR Hold] polyvinyl alcohol (LIQUIFILM TEARS) 1.4 % ophthalmic solution 1 drop  1 drop Both Eyes QID Bennie Pierini, MD   1 drop at 01/26/18 1419  . [MAR Hold] risperiDONE (RISPERDAL M-TABS) disintegrating tablet 2 mg  2 mg Sublingual BID Bennie Pierini, MD   Stopped at 01/26/18 1014  . [MAR Hold] sennosides (SENOKOT) 8.8 MG/5ML syrup 15 mL  15 mL Oral BID Bennie Pierini, MD   Stopped at 01/26/18 0240  . [MAR Hold] traZODone (DESYREL) tablet 50 mg  50 mg Oral BID Bennie Pierini, MD   Stopped at 01/26/18 1015  . [MAR Hold] vancomycin (VANCOCIN) 1,250 mg in sodium chloride 0.9 % 250 mL IVPB  1,250 mg Intravenous Q12H Roxan Hockey, MD   Stopped at 01/26/18 1247     Allergies  Allergen Reactions  . Ativan [Lorazepam] Other (See Comments)    Unknown (not listed on physician's orders from [facility] 07/05/15)  . Latex Dermatitis  . Morphine And Related Other (See Comments)    Listed on physician's orders from [facility] 07/05/15  . Penicillins Other (See Comments)    Listed on physician's orders from [facility] 07/05/15 Has patient had a PCN reaction causing immediate rash, facial/tongue/throat swelling, SOB or lightheadedness with hypotension: unknown Has patient had a PCN reaction causing severe rash involving mucus membranes or skin necrosis: unknown Has patient had a PCN reaction that required hospitalization: no Has patient had a PCN reaction occurring within the last 10 years: yes If all of the above answers are "NO", then may proceed with Cephalosporin [place]  . Pyridium [Phenazopyridine Hcl] Other (See Comments)    Listed on physician's orders from [facility] 07/05/15  . Soy Allergy Other (See Comments)    Listed on physician's  orders from [facility] 07/05/15    BP 113/76 (BP Location: Left Arm)   Pulse 64  Temp 97.8 F (36.6 C) (Oral)   Resp 18   Wt 119 kg (262 lb 5.6 oz)   SpO2 96%   BMI 36.59 kg/m   Labs: Results for orders placed or performed during the hospital encounter of 01/25/18 (from the past 48 hour(s))  CBC with Differential     Status: Abnormal   Collection Time: 01/25/18  5:55 PM  Result Value Ref Range   WBC 8.3 4.0 - 10.5 K/uL   RBC 4.66 4.22 - 5.81 MIL/uL   Hemoglobin 12.6 (L) 13.0 - 17.0 g/dL   HCT 39.0 39.0 - 52.0 %   MCV 83.7 78.0 - 100.0 fL   MCH 27.0 26.0 - 34.0 pg   MCHC 32.3 30.0 - 36.0 g/dL   RDW 14.7 11.5 - 15.5 %   Platelets 174 150 - 400 K/uL   Neutrophils Relative % 63 %   Neutro Abs 5.3 1.7 - 7.7 K/uL   Lymphocytes Relative 23 %   Lymphs Abs 1.9 0.7 - 4.0 K/uL   Monocytes Relative 12 %   Monocytes Absolute 1.0 0.1 - 1.0 K/uL   Eosinophils Relative 2 %   Eosinophils Absolute 0.1 0.0 - 0.7 K/uL   Basophils Relative 0 %   Basophils Absolute 0.0 0.0 - 0.1 K/uL  Comprehensive metabolic panel     Status: Abnormal   Collection Time: 01/25/18  5:55 PM  Result Value Ref Range   Sodium 142 135 - 145 mmol/L   Potassium 4.6 3.5 - 5.1 mmol/L   Chloride 107 101 - 111 mmol/L   CO2 26 22 - 32 mmol/L   Glucose, Bld 92 65 - 99 mg/dL   BUN 15 6 - 20 mg/dL   Creatinine, Ser 1.05 0.61 - 1.24 mg/dL   Calcium 8.9 8.9 - 10.3 mg/dL   Total Protein 7.3 6.5 - 8.1 g/dL   Albumin 3.0 (L) 3.5 - 5.0 g/dL   AST 15 15 - 41 U/L   ALT 9 (L) 17 - 63 U/L   Alkaline Phosphatase 71 38 - 126 U/L   Total Bilirubin 0.1 (L) 0.3 - 1.2 mg/dL   GFR calc non Af Amer >60 >60 mL/min   GFR calc Af Amer >60 >60 mL/min    Comment: (NOTE) The eGFR has been calculated using the CKD EPI equation. This calculation has not been validated in all clinical situations. eGFR's persistently <60 mL/min signify possible Chronic Kidney Disease.    Anion gap 9 5 - 15  Culture, blood (routine x 2)     Status: None  (Preliminary result)   Collection Time: 01/25/18  6:18 PM  Result Value Ref Range   Specimen Description BLOOD LEFT HAND    Special Requests IN PEDIATRIC BOTTLE Blood Culture adequate volume    Culture  Setup Time      GRAM POSITIVE COCCI IN PEDIATRIC BOTTLE CRITICAL VALUE NOTED.  VALUE IS CONSISTENT WITH PREVIOUSLY REPORTED AND CALLED VALUE. Performed at [facility], [place] [address] [place], [place] [address]    Culture GRAM POSITIVE COCCI    Report Status PENDING   Culture, blood (routine x 2)     Status: None (Preliminary result)   Collection Time: 01/25/18  6:18 PM  Result Value Ref Range   Specimen Description BLOOD RIGHT HAND    Special Requests      IN PEDIATRIC BOTTLE Blood Culture adequate volume Performed at [facility] [address], [place], [place] [address]    Culture  Setup  Time      GRAM POSITIVE COCCI IN PEDIATRIC BOTTLE Organism ID to follow CRITICAL RESULT CALLED TO, READ BACK BY AND VERIFIED WITH: M. Lilliston Pharm.D. 17:10 01/26/18 ([email or web address])    Culture GRAM POSITIVE COCCI    Report Status PENDING   Blood Culture ID Panel (Reflexed)     Status: Abnormal   Collection Time: 01/25/18  6:18 PM  Result Value Ref Range   Enterococcus species NOT DETECTED NOT DETECTED   Listeria monocytogenes NOT DETECTED NOT DETECTED   Staphylococcus species DETECTED (A) NOT DETECTED    Comment: Methicillin (oxacillin) resistant coagulase negative staphylococcus. Possible blood culture contaminant (unless isolated from more than one blood culture draw or clinical case suggests pathogenicity). No antibiotic treatment is indicated for blood  culture contaminants. CRITICAL RESULT CALLED TO, READ BACK BY AND VERIFIED WITH: M. Lilliston Pharm.D. 17:10 01/26/18 ([email or web address])    Staphylococcus aureus NOT DETECTED NOT DETECTED   Methicillin resistance DETECTED (A) NOT DETECTED    Comment: CRITICAL RESULT CALLED TO, READ BACK BY AND VERIFIED WITH: M. Lilliston Pharm.D. 17:10  01/26/18 ([email or web address])    Streptococcus species NOT DETECTED NOT DETECTED   Streptococcus agalactiae NOT DETECTED NOT DETECTED   Streptococcus pneumoniae NOT DETECTED NOT DETECTED   Streptococcus pyogenes NOT DETECTED NOT DETECTED   Acinetobacter baumannii NOT DETECTED NOT DETECTED   Enterobacteriaceae species NOT DETECTED NOT DETECTED   Enterobacter cloacae complex NOT DETECTED NOT DETECTED   Escherichia coli NOT DETECTED NOT DETECTED   Klebsiella oxytoca NOT DETECTED NOT DETECTED   Klebsiella pneumoniae NOT DETECTED NOT DETECTED   Proteus species NOT DETECTED NOT DETECTED   Serratia marcescens NOT DETECTED NOT DETECTED   Haemophilus influenzae NOT DETECTED NOT DETECTED   Neisseria meningitidis NOT DETECTED NOT DETECTED   Pseudomonas aeruginosa NOT DETECTED NOT DETECTED   Candida albicans NOT DETECTED NOT DETECTED   Candida glabrata NOT DETECTED NOT DETECTED   Candida krusei NOT DETECTED NOT DETECTED   Candida parapsilosis NOT DETECTED NOT DETECTED   Candida tropicalis NOT DETECTED NOT DETECTED  I-Stat CG4 Lactic Acid, ED     Status: None   Collection Time: 01/25/18  6:23 PM  Result Value Ref Range   Lactic Acid, Venous 1.78 0.5 - 1.9 mmol/L  Magnesium     Status: Abnormal   Collection Time: 01/25/18 11:28 PM  Result Value Ref Range   Magnesium 1.6 (L) 1.7 - 2.4 mg/dL  Phosphorus     Status: None   Collection Time: 01/25/18 11:28 PM  Result Value Ref Range   Phosphorus 2.9 2.5 - 4.6 mg/dL  Prealbumin     Status: Abnormal   Collection Time: 01/25/18 11:28 PM  Result Value Ref Range   Prealbumin 11.8 (L) 18 - 38 mg/dL    Comment: Performed at [facility], [address], [place], [place] [address]  APTT     Status: None   Collection Time: 01/25/18 11:28 PM  Result Value Ref Range   aPTT 36 24 - 36 seconds  Protime-INR     Status: None   Collection Time: 01/25/18 11:28 PM  Result Value Ref Range   Prothrombin Time 14.5 11.4 - 15.2 seconds   INR 1.14   TSH     Status:  Abnormal   Collection Time: 01/25/18 11:28 PM  Result Value Ref Range   TSH 0.338 (L) 0.350 - 4.500 uIU/mL    Comment: Performed by a 3rd Generation assay with a functional sensitivity of <=0.01 uIU/mL.  Cortisol-am, blood  Status: Abnormal   Collection Time: 01/26/18  5:56 AM  Result Value Ref Range   Cortisol - AM 5.3 (L) 6.7 - 22.6 ug/dL    Comment: Performed at [facility] [address] [place], [place] [address]  Basic metabolic panel     Status: Abnormal   Collection Time: 01/26/18  5:58 AM  Result Value Ref Range   Sodium 142 135 - 145 mmol/L   Potassium 3.7 3.5 - 5.1 mmol/L    Comment: DELTA CHECK NOTED REPEATED TO VERIFY    Chloride 109 101 - 111 mmol/L   CO2 27 22 - 32 mmol/L   Glucose, Bld 80 65 - 99 mg/dL   BUN 13 6 - 20 mg/dL   Creatinine, Ser 0.87 0.61 - 1.24 mg/dL   Calcium 8.6 (L) 8.9 - 10.3 mg/dL   GFR calc non Af Amer >60 >60 mL/min   GFR calc Af Amer >60 >60 mL/min    Comment: (NOTE) The eGFR has been calculated using the CKD EPI equation. This calculation has not been validated in all clinical situations. eGFR's persistently <60 mL/min signify possible Chronic Kidney Disease.    Anion gap 6 5 - 15  CBC     Status: Abnormal   Collection Time: 01/26/18  5:58 AM  Result Value Ref Range   WBC 8.6 4.0 - 10.5 K/uL   RBC 4.28 4.22 - 5.81 MIL/uL   Hemoglobin 11.3 (L) 13.0 - 17.0 g/dL   HCT 35.4 (L) 39.0 - 52.0 %   MCV 82.7 78.0 - 100.0 fL   MCH 26.4 26.0 - 34.0 pg   MCHC 31.9 30.0 - 36.0 g/dL   RDW 15.1 11.5 - 15.5 %   Platelets 162 150 - 400 K/uL  CBG monitoring, ED     Status: None   Collection Time: 01/26/18  9:34 AM  Result Value Ref Range   Glucose-Capillary 66 65 - 99 mg/dL  CBG monitoring, ED     Status: Abnormal   Collection Time: 01/26/18 11:22 AM  Result Value Ref Range   Glucose-Capillary 112 (H) 65 - 99 mg/dL  Surgical PCR screen     Status: None   Collection Time: 01/26/18  1:20 PM  Result Value Ref Range   MRSA, PCR  NEGATIVE NEGATIVE   Staphylococcus aureus NEGATIVE NEGATIVE    Comment: (NOTE) The Xpert SA Assay (FDA approved for NASAL specimens in patients 55 years of age and older), is one component of a comprehensive surveillance program. It is not intended to diagnose infection nor to guide or monitor treatment.     Imaging / Studies: Ct Abdomen Pelvis W Contrast  Result Date: 01/25/2018 CLINICAL DATA:  Abscess near G-tube insertion site. Redness/swelling. History of right nephrectomy, peg tube placement, AKI EXAM: CT ABDOMEN AND PELVIS WITH CONTRAST TECHNIQUE: Multidetector CT imaging of the abdomen and pelvis was performed using the standard protocol following bolus administration of intravenous contrast. CONTRAST:  160m ISOVUE-300 IOPAMIDOL (ISOVUE-300) INJECTION 61% COMPARISON:  CT abdomen dated 10/04/2017. FINDINGS: Lower chest: No acute abnormality. Hepatobiliary: No focal liver abnormality is seen. Status post cholecystectomy. No biliary dilatation. Pancreas: Unremarkable. No pancreatic ductal dilatation or surrounding inflammatory changes. Spleen: Normal in size without focal abnormality. Adrenals/Urinary Tract: Right kidney is surgically absent. Adrenal glands appear normal. Stable hypodense mass within the upper pole of the left kidney, again measuring 2.3 cm, described as benign on earlier MRI abdomen. No left-sided hydronephrosis or perinephric fluid. No ureteral or bladder calculi. Stomach/Bowel: No dilated  large or small bowel loops. No bowel wall thickening or evidence of bowel wall inflammation seen. Peg tube appears appropriately positioned in the stomach. Vascular/Lymphatic: Mild aortic atherosclerosis. No enlarged lymph nodes seen. Reproductive: Prostate is unremarkable. Other: Lobular abscess collection within the subcutaneous soft tissues of the anterior abdominal wall, supraumbilical, measuring 8.7 x 6 x 6.1 cm (transverse by AP by craniocaudal dimensions). The abscess collection involves  the underlying anterior abdominal wall rectus musculature but does not extend into the intraperitoneal abdomen. No intraperitoneal free fluid or abscess collection. No free intraperitoneal air. 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 No acute or suspicious osseous finding. IMPRESSION: 1. Anterior abdominal wall abscess collection, supraumbilical near the midline, measuring 8.7 x 6 x 6.1 cm, centered within the subcutaneous soft tissues but extending into the underlying anterior abdominal wall rectus musculature. No intraperitoneal extension. 2. The abscess collection within the anterior abdominal wall is located approximately 3 cm to the right of the PEG tube insertion site. The abscess does not appear to involve the PEG tube insertion site. 3. Peg tube appears adequately positioned in the stomach. 4. Right kidney is surgically absent. 5. Aortic atherosclerosis. 6. 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 Electronically Signed   By: Franki Cabot M.D.   On: 01/25/2018 20:18     .Adin Hector, M.D., F.A.C.S. Gastrointestinal and Minimally Invasive Surgery [facility] [address] [place], [place] [address] [phone] Main / Paging  01/26/2018 5:46 PM     Adin Hector

## 2018-01-26 NOTE — Anesthesia Postprocedure Evaluation (Signed)
Anesthesia Post Note  Patient: [name]  Procedure(s) Performed: IRRIGATION AND DEBRIDEMENT ABDOMINAL WALL ABSCESS WITH REMOVAL OF INFECTED MESH (N/A Abdomen)     Patient location during evaluation: PACU Anesthesia Type: General Level of consciousness: awake and alert Pain management: pain level controlled Vital Signs Assessment: post-procedure vital signs reviewed and stable Respiratory status: spontaneous breathing, nonlabored ventilation and respiratory function stable Cardiovascular status: blood pressure returned to baseline and stable Postop Assessment: no apparent nausea or vomiting Anesthetic complications: no    Last Vitals:  Vitals:   01/26/18 2000 01/26/18 2034  BP: (!) 104/59 108/73  Pulse: (!) 56 62  Resp: 15 16  Temp: 36.5 C 36.6 C  SpO2: 100% 95%    Last Pain:  Vitals:   01/26/18 1413  TempSrc: Oral                 Beryle Lathehomas E Brock

## 2018-01-26 NOTE — Transfer of Care (Signed)
Immediate Anesthesia Transfer of Care Note  Patient: [name]  Procedure(s) Performed: Procedure(s): IRRIGATION AND DEBRIDEMENT ABDOMINAL WALL ABSCESS WITH REMOVAL OF INFECTED MESH (N/A)  Patient Location: PACU  Anesthesia Type:General  Level of Consciousness:  sedated, patient cooperative and responds to stimulation  Airway & Oxygen Therapy:Patient Spontanous Breathing and Patient connected to face mask oxgen  Post-op Assessment:  Report given to PACU RN and Post -op Vital signs reviewed and stable  Post vital signs:  Reviewed and stable  Last Vitals:  Vitals:   01/26/18 1303 01/26/18 1413  BP: 102/73 113/76  Pulse: 70 64  Resp:  18  Temp:  36.6 C  SpO2:  96%    Complications: No apparent anesthesia complications

## 2018-01-26 NOTE — Progress Notes (Signed)
PHARMACY - PHYSICIAN COMMUNICATION CRITICAL VALUE ALERT - BLOOD CULTURE IDENTIFICATION (BCID)  [name] is an 52 y.o. male who [facility] with a chief complaint of abdominal wall abscess  Assessment:  Currently on D1 Vancomycin & Meropenem after failed outpatient regimen of doxycycline.  He is s/p I&D.  2/2 pediatric bottles now + staph with MecA gene.  Since this is possibly a contaminant would not recommend de-escalation based on this culture data.  Name of physician (or Provider) Contacted: Emokpae  Current antibiotics: Meropenem 1gm IV q8h, Vanc 1250mg  IV q12h  Changes to prescribed antibiotics recommended:  Patient is on recommended antibiotics - No changes needed  Results for orders placed or performed during the hospital encounter of 01/25/18  Blood Culture ID Panel (Reflexed) (Collected: 01/25/2018  6:18 PM)  Result Value Ref Range   Enterococcus species NOT DETECTED NOT DETECTED   Listeria monocytogenes NOT DETECTED NOT DETECTED   Staphylococcus species DETECTED (A) NOT DETECTED   Staphylococcus aureus NOT DETECTED NOT DETECTED   Methicillin resistance DETECTED (A) NOT DETECTED   Streptococcus species NOT DETECTED NOT DETECTED   Streptococcus agalactiae NOT DETECTED NOT DETECTED   Streptococcus pneumoniae NOT DETECTED NOT DETECTED   Streptococcus pyogenes NOT DETECTED NOT DETECTED   Acinetobacter baumannii NOT DETECTED NOT DETECTED   Enterobacteriaceae species NOT DETECTED NOT DETECTED   Enterobacter cloacae complex NOT DETECTED NOT DETECTED   Escherichia coli NOT DETECTED NOT DETECTED   Klebsiella oxytoca NOT DETECTED NOT DETECTED   Klebsiella pneumoniae NOT DETECTED NOT DETECTED   Proteus species NOT DETECTED NOT DETECTED   Serratia marcescens NOT DETECTED NOT DETECTED   Haemophilus influenzae NOT DETECTED NOT DETECTED   Neisseria meningitidis NOT DETECTED NOT DETECTED   Pseudomonas aeruginosa NOT DETECTED NOT DETECTED   Candida albicans  NOT DETECTED NOT DETECTED   Candida glabrata NOT DETECTED NOT DETECTED   Candida krusei NOT DETECTED NOT DETECTED   Candida parapsilosis NOT DETECTED NOT DETECTED   Candida tropicalis NOT DETECTED NOT DETECTED    [name], [name] 01/26/2018  5:12 PM

## 2018-01-26 NOTE — ED Notes (Signed)
SURGEON STATES HE WOULD SPEAK WITH MOTHER ABOUT PROCEDURE AND PT.

## 2018-01-26 NOTE — ED Notes (Signed)
ED TO INPATIENT HANDOFF REPORT  Name/Age/Gender [name] 52 y.o. male  Code Status    Code Status Orders  (From admission, onward)        Start     Ordered   01/25/18 2319  Full code  Continuous     01/25/18 2318    Code Status History    Date Active Date Inactive Code Status Order ID Comments User Context   10/03/2017 01:31 10/07/2017 21:50 Full Code [id number]  Etta Quill, DO ED   01/03/2013 10:01 01/05/2013 14:17 Full Code [id number]  Vic Ripper, RN Inpatient   12/22/2011 03:28 12/24/2011 19:00 Full Code [id number]  Latrelle Dodrill, RN ED   11/23/2011 21:33 11/30/2011 19:23 Full Code [id number]  Murlean Iba, MD ED      Home/SNF/Other    Chief Complaint abd abcess   Level of Care/Admitting Diagnosis ED Disposition    ED Disposition Condition [facility] Area: [facility] [[id number]]  Level of Care: Med-Surg [16]  Diagnosis: Abdominal wall abscess [[id number]]  Admitting Physician: Bennie Pierini [[id number]]  Attending Physician: Jonnie Finner, [place] [[id number]]  Estimated length of stay: past midnight tomorrow  Certification:: I certify this patient will need inpatient services for at least 2 midnights  PT Class (Do Not Modify): Inpatient [101]  PT Acc Code (Do Not Modify): Private [1]       Medical History Past Medical History:  Diagnosis Date  . A-fib ([place])   . Addison disease ([place])   . Addison disease ([place])   . Anoxic brain injury ([place])   . Anxiety   . Aspiration pneumonia ([place])   . Atrial fibrillation ([place])   . Chronic constipation 11/23/2011  . Dysphagia   . Dysphagia   . Encephalopathy   . Glaucoma   . Glucocorticoid deficiency ([place])   . History of recurrent UTIs   . Hyperlipemia   . Hypothyroidism   . Mental disorder   . Myocardial infarction ([place])   . Pneumoperitoneum of unknown etiology 12/21/2011  . Quadriplegia ([place])   . Reflux   . Seizure disorder (H[facility])     Allergies Allergies  Allergen  Reactions  . Ativan [Lorazepam] Other (See Comments)    Unknown (not listed on physician's orders from [facility] 07/05/15)  . Latex Dermatitis  . Morphine And Related Other (See Comments)    Listed on physician's orders from [facility] 07/05/15  . Penicillins Other (See Comments)    Listed on physician's orders from [facility] 07/05/15 Has patient had a PCN reaction causing immediate rash, facial/tongue/throat swelling, SOB or lightheadedness with hypotension: unknown Has patient had a PCN reaction causing severe rash involving mucus membranes or skin necrosis: unknown Has patient had a PCN reaction that required hospitalization: no Has patient had a PCN reaction occurring within the last 10 years: yes If all of the above answers are "NO", then may proceed with Cephalosporin [place]  . Pyridium [Phenazopyridine Hcl] Other (See Comments)    Listed on physician's orders from [facility] 07/05/15  . Soy Allergy Other (See Comments)    Listed on physician's orders from [facility] 07/05/15    IV Location/Drains/Wounds Patient Lines/Drains/Airways Status   Active Line/Drains/Airways    Name:   Placement date:   Placement time:   Site:   Days:   Peripheral IV 01/25/18 Right Antecubital   01/25/18    1828    Antecubital   1   Peripheral IV 01/26/18 Left  Wrist   01/26/18    0003    Wrist   less than 1   CVC Triple Lumen 10/03/17 Left   10/03/17    -     115   Gastrostomy/Enterostomy Gastrostomy 24 Fr. LUQ   11/24/17    1036    LUQ   63   External Urinary Catheter   10/03/17    0449    -   115   Pressure Ulcer 12/22/11 reddend, skin break down, minimal drainage present. no odor   12/22/11    1030     2227   Wound 11/13/11 Blister (Serous filled);Abrasion(s);Other (Comment) Arm Right;Anterior Rt AC IV infiltrated causing red area with small blisters    11/13/11    2300    Arm   2266   Wound / Incision (Open or Dehisced) 10/03/17 Non-pressure wound Abdomen Mid;Lower   10/03/17    0400    Abdomen   115    Wound / Incision (Open or Dehisced) 01/26/18 Non-pressure wound Abdomen Anterior AROUND PEG SITE   01/26/18    0753    Abdomen   less than 1   Wound / Incision (Open or Dehisced) 01/26/18 Non-pressure wound Abdomen Mid;Lower   01/26/18    0755    Abdomen   less than 1          Labs/Imaging Results for orders placed or performed during the hospital encounter of 01/25/18 (from the past 48 hour(s))  CBC with Differential     Status: Abnormal   Collection Time: 01/25/18  5:55 PM  Result Value Ref Range   WBC 8.3 4.0 - 10.5 K/uL   RBC 4.66 4.22 - 5.81 MIL/uL   Hemoglobin 12.6 (L) 13.0 - 17.0 g/dL   HCT 39.0 39.0 - 52.0 %   MCV 83.7 78.0 - 100.0 fL   MCH 27.0 26.0 - 34.0 pg   MCHC 32.3 30.0 - 36.0 g/dL   RDW 14.7 11.5 - 15.5 %   Platelets 174 150 - 400 K/uL   Neutrophils Relative % 63 %   Neutro Abs 5.3 1.7 - 7.7 K/uL   Lymphocytes Relative 23 %   Lymphs Abs 1.9 0.7 - 4.0 K/uL   Monocytes Relative 12 %   Monocytes Absolute 1.0 0.1 - 1.0 K/uL   Eosinophils Relative 2 %   Eosinophils Absolute 0.1 0.0 - 0.7 K/uL   Basophils Relative 0 %   Basophils Absolute 0.0 0.0 - 0.1 K/uL  Comprehensive metabolic panel     Status: Abnormal   Collection Time: 01/25/18  5:55 PM  Result Value Ref Range   Sodium 142 135 - 145 mmol/L   Potassium 4.6 3.5 - 5.1 mmol/L   Chloride 107 101 - 111 mmol/L   CO2 26 22 - 32 mmol/L   Glucose, Bld 92 65 - 99 mg/dL   BUN 15 6 - 20 mg/dL   Creatinine, Ser 1.05 0.61 - 1.24 mg/dL   Calcium 8.9 8.9 - 10.3 mg/dL   Total Protein 7.3 6.5 - 8.1 g/dL   Albumin 3.0 (L) 3.5 - 5.0 g/dL   AST 15 15 - 41 U/L   ALT 9 (L) 17 - 63 U/L   Alkaline Phosphatase 71 38 - 126 U/L   Total Bilirubin 0.1 (L) 0.3 - 1.2 mg/dL   GFR calc non Af Amer >60 >60 mL/min   GFR calc Af Amer >60 >60 mL/min    Comment: (NOTE) The eGFR has been calculated using the CKD  EPI equation. This calculation has not been validated in all clinical situations. eGFR's persistently <60 mL/min signify  possible Chronic Kidney Disease.    Anion gap 9 5 - 15  Culture, blood (routine x 2)     Status: None (Preliminary result)   Collection Time: 01/25/18  6:18 PM  Result Value Ref Range   Specimen Description      BLOOD LEFT HAND Blood Culture results may not be optimal due to an inadequate volume of blood received in culture bottles IN PEDIATRIC BOTTLE Performed at [facility], [place] [address] [place], [place] [address]    Special Requests NONE    Culture PENDING    Report Status PENDING   Culture, blood (routine x 2)     Status: None (Preliminary result)   Collection Time: 01/25/18  6:18 PM  Result Value Ref Range   Specimen Description      BLOOD RIGHT HAND Blood Culture results may not be optimal due to an inadequate volume of blood received in culture bottles IN PEDIATRIC BOTTLE Performed at [facility] [address] [place], [place] [address]    Special Requests NONE    Culture PENDING    Report Status PENDING   I-Stat CG4 Lactic Acid, ED     Status: None   Collection Time: 01/25/18  6:23 PM  Result Value Ref Range   Lactic Acid, Venous 1.78 0.5 - 1.9 mmol/L  Magnesium     Status: Abnormal   Collection Time: 01/25/18 11:28 PM  Result Value Ref Range   Magnesium 1.6 (L) 1.7 - 2.4 mg/dL  Phosphorus     Status: None   Collection Time: 01/25/18 11:28 PM  Result Value Ref Range   Phosphorus 2.9 2.5 - 4.6 mg/dL  Prealbumin     Status: Abnormal   Collection Time: 01/25/18 11:28 PM  Result Value Ref Range   Prealbumin 11.8 (L) 18 - 38 mg/dL    Comment: Performed at [facility], [place] [address] [place], [place] [address]  APTT     Status: None   Collection Time: 01/25/18 11:28 PM  Result Value Ref Range   aPTT 36 24 - 36 seconds  Protime-INR     Status: None   Collection Time: 01/25/18 11:28 PM  Result Value Ref Range   Prothrombin Time 14.5 11.4 - 15.2 seconds   INR 1.14   TSH     Status: Abnormal   Collection Time: 01/25/18 11:28 PM  Result  Value Ref Range   TSH 0.338 (L) 0.350 - 4.500 uIU/mL    Comment: Performed by a 3rd Generation assay with a functional sensitivity of <=0.01 uIU/mL.  Cortisol-am, blood     Status: Abnormal   Collection Time: 01/26/18  5:56 AM  Result Value Ref Range   Cortisol - AM 5.3 (L) 6.7 - 22.6 ug/dL    Comment: Performed at [facility] [address], [place], [place] [address]  Basic metabolic panel     Status: Abnormal   Collection Time: 01/26/18  5:58 AM  Result Value Ref Range   Sodium 142 135 - 145 mmol/L   Potassium 3.7 3.5 - 5.1 mmol/L    Comment: DELTA CHECK NOTED REPEATED TO VERIFY    Chloride 109 101 - 111 mmol/L   CO2 27 22 - 32 mmol/L   Glucose, Bld 80 65 - 99 mg/dL   BUN 13 6 - 20 mg/dL   Creatinine, Ser 0.87 0.61 -  1.24 mg/dL   Calcium 8.6 (L) 8.9 - 10.3 mg/dL   GFR calc non Af Amer >60 >60 mL/min   GFR calc Af Amer >60 >60 mL/min    Comment: (NOTE) The eGFR has been calculated using the CKD EPI equation. This calculation has not been validated in all clinical situations. eGFR's persistently <60 mL/min signify possible Chronic Kidney Disease.    Anion gap 6 5 - 15  CBC     Status: Abnormal   Collection Time: 01/26/18  5:58 AM  Result Value Ref Range   WBC 8.6 4.0 - 10.5 K/uL   RBC 4.28 4.22 - 5.81 MIL/uL   Hemoglobin 11.3 (L) 13.0 - 17.0 g/dL   HCT 35.4 (L) 39.0 - 52.0 %   MCV 82.7 78.0 - 100.0 fL   MCH 26.4 26.0 - 34.0 pg   MCHC 31.9 30.0 - 36.0 g/dL   RDW 15.1 11.5 - 15.5 %   Platelets 162 150 - 400 K/uL  CBG monitoring, ED     Status: None   Collection Time: 01/26/18  9:34 AM  Result Value Ref Range   Glucose-Capillary 66 65 - 99 mg/dL  CBG monitoring, ED     Status: Abnormal   Collection Time: 01/26/18 11:22 AM  Result Value Ref Range   Glucose-Capillary 112 (H) 65 - 99 mg/dL   Ct Abdomen Pelvis W Contrast  Result Date: 01/25/2018 CLINICAL DATA:  Abscess near G-tube insertion site. Redness/swelling. History of right nephrectomy, peg tube  placement, AKI EXAM: CT ABDOMEN AND PELVIS WITH CONTRAST TECHNIQUE: Multidetector CT imaging of the abdomen and pelvis was performed using the standard protocol following bolus administration of intravenous contrast. CONTRAST:  121m ISOVUE-300 IOPAMIDOL (ISOVUE-300) INJECTION 61% COMPARISON:  CT abdomen dated 10/04/2017. FINDINGS: Lower chest: No acute abnormality. Hepatobiliary: No focal liver abnormality is seen. Status post cholecystectomy. No biliary dilatation. Pancreas: Unremarkable. No pancreatic ductal dilatation or surrounding inflammatory changes. Spleen: Normal in size without focal abnormality. Adrenals/Urinary Tract: Right kidney is surgically absent. Adrenal glands appear normal. Stable hypodense mass within the upper pole of the left kidney, again measuring 2.3 cm, described as benign on earlier MRI abdomen. No left-sided hydronephrosis or perinephric fluid. No ureteral or bladder calculi. Stomach/Bowel: No dilated large or small bowel loops. No bowel wall thickening or evidence of bowel wall inflammation seen. Peg tube appears appropriately positioned in the stomach. Vascular/Lymphatic: Mild aortic atherosclerosis. No enlarged lymph nodes seen. Reproductive: Prostate is unremarkable. Other: Lobular abscess collection within the subcutaneous soft tissues of the anterior abdominal wall, supraumbilical, measuring 8.7 x 6 x 6.1 cm (transverse by AP by craniocaudal dimensions). The abscess collection involves the underlying anterior abdominal wall rectus musculature but does not extend into the intraperitoneal abdomen. No intraperitoneal free fluid or abscess collection. No free intraperitoneal air. 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 No acute or suspicious osseous finding. IMPRESSION: 1. Anterior abdominal wall abscess collection, supraumbilical near the midline, measuring 8.7 x 6 x 6.1 cm, centered  within the subcutaneous soft tissues but extending into the underlying anterior abdominal wall rectus musculature. No intraperitoneal extension. 2. The abscess collection within the anterior abdominal wall is located approximately 3 cm to the right of the PEG tube insertion site. The abscess does not appear to involve the PEG tube insertion site. 3. Peg tube appears adequately positioned in the stomach. 4. Right kidney is surgically absent. 5. Aortic atherosclerosis. 6. 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 Electronically Signed   By: Franki Cabot M.D.   On: 01/25/2018 20:18    Pending Labs Unresulted Labs (From admission, onward)   None      Vitals/Pain Today's Vitals   01/26/18 0800 01/26/18 0930 01/26/18 1000 01/26/18 1100  BP:  110/76 119/87 107/64  Pulse:  74 78 82  Resp:      Temp: 98.3 F (36.8 C)     TempSrc: Oral     SpO2:  97% 97% (!) 84%  Weight:        Isolation Precautions No active isolations  Medications Medications  divalproex (DEPAKOTE SPRINKLE) capsule 500 mg (500 mg Oral Given 01/26/18 0224)  docusate sodium (COLACE) capsule 100 mg (0 mg Oral Hold 01/26/18 1012)  levothyroxine (SYNTHROID, LEVOTHROID) tablet 200 mcg (0 mcg Oral Hold 01/26/18 0900)  metoprolol tartrate (LOPRESSOR) 25 mg/10 mL oral suspension 12.5 mg (12.5 mg Per Tube Given 01/26/18 0229)  multivitamin liquid 15 mL (not administered)  pantoprazole sodium (PROTONIX) 40 mg/20 mL oral suspension 40 mg (not administered)  PARoxetine (PAXIL) tablet 20 mg (0 mg Oral Hold 01/26/18 1013)  risperiDONE (RISPERDAL M-TABS) disintegrating tablet 2 mg (0 mg Sublingual Hold 01/26/18 1014)  sennosides (SENOKOT) 8.8 MG/5ML syrup 15 mL (0 mLs Oral Hold 01/26/18 1015)  traZODone (DESYREL) tablet 50 mg (0 mg Oral Hold 01/26/18 1015)  polyethylene glycol (MIRALAX / GLYCOLAX) packet 17 g (0 g Oral Hold 01/26/18 1014)  polyvinyl alcohol  (LIQUIFILM TEARS) 1.4 % ophthalmic solution 1 drop (1 drop Both Eyes Not Given 01/26/18 0145)  enoxaparin (LOVENOX) injection 40 mg (40 mg Subcutaneous Given 01/26/18 0230)  meropenem (MERREM) 1 g in sodium chloride 0.9 % 100 mL IVPB (0 g Intravenous Stopped 01/26/18 0740)  vancomycin (VANCOCIN) 1,250 mg in sodium chloride 0.9 % 250 mL IVPB (not administered)  dextrose 5 %-0.45 % sodium chloride infusion ( Intravenous New Bag/Given 01/26/18 1009)  hydrocortisone sodium succinate (SOLU-CORTEF) 100 MG injection 100 mg (not administered)  hydrocortisone sodium succinate (SOLU-CORTEF) 100 MG injection 50 mg (not administered)  iopamidol (ISOVUE-300) 61 % injection (100 mLs  Contrast Given 01/25/18 1936)  vancomycin (VANCOCIN) 2,000 mg in sodium chloride 0.9 % 500 mL IVPB (0 mg Intravenous Stopped 01/26/18 0127)  dextrose 50 % solution 50 mL (50 mLs Intravenous Given 01/26/18 1009)    Mobility {Mobility:[id number]

## 2018-01-26 NOTE — Progress Notes (Addendum)
Pharmacy Antibiotic Note  [name] is a 52 y.o. male admitted on 01/25/2018 with cellulitis.  Pharmacy has been consulted for Vancomycin, meropenem dosing.  Plan: Meropenem 1gm iv q8hr Vancomycin 2gm iv x1, then 1250mg  iv q12hr Goal AUC = 400 - 500 for all indications, except meningitis (goal AUC > 500 and Cmin 15-20 mcg/mL)   Weight: 262 lb 5.6 oz (119 kg)  Temp (24hrs), Avg:99.3 F (37.4 C), Min:99.1 F (37.3 C), Max:99.4 F (37.4 C)  Recent Labs  Lab 01/25/18 1755 01/25/18 1823 01/26/18 0558  WBC 8.3  --  8.6  CREATININE 1.05  --  0.87  LATICACIDVEN  --  1.78  --     Estimated Creatinine Clearance: 133.3 mL/min (by C-G formula based on SCr of 0.87 mg/dL).    Allergies  Allergen Reactions  . Ativan [Lorazepam] Other (See Comments)    Unknown ([facility]  . Latex Dermatitis  . Morphine And Related Other (See Comments)    [facility]  . Penicillins Other (See Comments)    [facility] Has patient had a PCN reaction causing immediate rash, facial/tongue/throat swelling, SOB or lightheadedness with hypotension: unknown Has patient had a PCN reaction causing severe rash involving mucus membranes or skin necrosis: unknown Has patient had a PCN reaction that required hospitalization: no Has patient had a PCN reaction occurring within the last 10 years: yes If all of the above answers are "NO", then may proceed with Cephalosporin [place]  . Pyridium [Phenazopyridine Hcl] Other (See Comments)    [facility]  . Soy Allergy Other (See Comments)    [facility]    Antimicrobials this admission: Vancomycin 01/25/2018 >> Meropenem 01/25/2018 >>  Dose adjustments this admission: -  Microbiology results: pending  Thank you for allowing pharmacy to be a part of this  patient's care.  Steven DavidsonGrimsley Strickland, [name] 01/26/2018 6:51 AM

## 2018-01-26 NOTE — Op Note (Addendum)
01/26/2018  6:46 PM  PATIENT:  [name]  52 y.o. male  Patient Care Team: Georgann HousekeeperHusain, Karrar, MD as PCP - General (Internal Medicine)  PRE-OPERATIVE DIAGNOSIS:  abdominal wall abscess  POST-OPERATIVE DIAGNOSIS:  Infected mesh, abdominal wall abscess  PROCEDURE:   IRRIGATION AND DEBRIDEMENT ABDOMINAL WALL ABSCESS WITH REMOVAL OF INFECTED MESH  SURGEON:  Ardeth SportsmanSteven C. Lorri Fukuhara, MD  ASSISTANT: none   ANESTHESIA:   general  EBL:  Total I/O In: 1350 [I.V.:1000; IV Piggyback:350] Out: -   Delay start of Pharmacological VTE agent (>24hrs) due to surgical blood loss or risk of bleeding:  no  DRAINS: none   SPECIMENS:    1.  Abdominal abscess cavity sent aerobic and anaerobic culture. 2 Supraumbilical mesh removed and sent to pathology  COUNTS:  YES  PLAN OF CARE: Admit to inpatient   PATIENT DISPOSITION:  PACU - hemodynamically stable.  INDICATION: Bedridden patient with anoxic brain injury and chronic gastrostomy tube dependence with abdominal wall pain and swelling.  Found to have subcutaneous abscess cavity.  History of prior laparotomy incisions.  Suspicion for prior hernia repair.  Discussed and seen by my partners.  Recommendation made for incision and drainage.  Patient unable to give consent but patient's mother discussed with my partner, Dr Dwain SarnaWakefield, discussed in detail and agreed to proceed.   OR FINDINGS:   Epigastric abdominal wall abscess going through abdominal wall and rectus muscles.  3 x 3 cm pocket of mesh at the base.  Some Prolene stitches.  Floating in the abscess.  Removed.  Wound measures 5cm by 3cm.  Deeper undermining pocket of 10 x 7 cm.  It is 6cm deep.  It comes close to the gastrostomy tube but gastrostomy tube is not involved in obvious leaking there.  Just supraumbilical 1 cm sinus tract goes 4 cm deep and stops at fascia.  No obvious stitch abscess.  It is not connected to the epigastric abscess nor to the gastrostomy tube.  Tract debrided at skin  dermis and subcutaneous tissues.  No evidence of any gastrocutaneous nor enterocutaneous fistula   DESCRIPTION:   Informed consent was confirmed. The patient received IV antibiotics. The patient underwent general anesthesia without any difficulty. The patient was positioned supne. SCDs were active during the entire case. The area around the abscess was prepped and draped in a sterile fashion. A surgical timeout confirmed our plan.   Could see the swelling in the epigastric region along the middle part of a old right subcostal incision.  I placed an 18-gauge needle through the most fluctuant area and immediately returned dark yellow gray purulence.  That was sent for aerobic and anaerobic culture.  I made an incision over the most fluctuant area of the mass.   Released a baseball sized pocket of pus.  Encountered a chronic cavity.  I placed my finger into the abscess cavity to break up loculations.  The incision was extended to adequately expose the entire cavity.    We did copious irrigation. The fascia was viable.  I could feel mesh at the apex of the base of the wound.  I was able to easily remove it.  Some Prolene stitch as well.  Did more irrigation.  There are a few deep subcutaneous columns of scar that I excised through to open up the area to one continuous cavity.  It came medially towards the gastrostomy tube it did not breach into that region.  It felt like there were several centimeters between the left lateral  aspect the abscess cavity gastrostomy tube.  Patient also had an obvious supraumbilical 1 cm sinus tract.  I can probe 4 cm deep quite easily.  Seem like chronic granulation.  I cannot feel nor detect any stitch abscess or anything else.  I opened up the skin and debrided out some of the chronic sinus tract down to about a centimeter.  I held off on any more deep debridement.  There was some mild oozing.. We took extra care to ensure hemostasis.  On the skin and subcutaneous tissues.   I held off on any more aggressive cautery down on the subfascial regions.  The wounds were packed with antibiotic-soaked rolled gauze.  Sterile dressings applied.  Patient is being extubated go to recovery room.   We plan to continue IV antibiotics and begin wound care training tomorrow.    I discussed operative findings, updated the patient's status, discussed probable steps to recovery, and gave postoperative recommendations to the Patient's mother, [name]..  Recommendations were made.  Questions were answered.  She expressed understanding & appreciation.   Ardeth Sportsman, M.D., F.A.C.S. Gastrointestinal and Minimally Invasive Surgery [facility] [address] [place], [place] [address] [phone] Main / Paging

## 2018-01-26 NOTE — Anesthesia Procedure Notes (Signed)
Procedure Name: LMA Insertion Date/Time: 01/26/2018 5:57 PM Performed by: Paris LoreBlanton, Paizlee Kinder M, CRNA Pre-anesthesia Checklist: Patient identified, Emergency Drugs available, Suction available, Patient being monitored and Timeout performed Patient Re-evaluated:Patient Re-evaluated prior to induction Oxygen Delivery Method: Circle system utilized Preoxygenation: Pre-oxygenation with 100% oxygen Induction Type: IV induction Ventilation: Mask ventilation without difficulty LMA: LMA inserted LMA Size: 4.0 Number of attempts: 1 Placement Confirmation: positive ETCO2 and breath sounds checked- equal and bilateral Tube secured with: Tape Comments: Placed by R. Berna Bueochran, CRNA

## 2018-01-26 NOTE — ED Notes (Signed)
ADMISSION MD Provider at bedside. 

## 2018-01-26 NOTE — Anesthesia Preprocedure Evaluation (Addendum)
Anesthesia Evaluation  Patient identified by MRN, date of birth, ID band Patient confused    Reviewed: Allergy & Precautions, NPO status , Patient's Chart, lab work & pertinent test results, reviewed documented beta blocker date and time   Airway Mallampati: II  TM Distance: >3 FB Neck ROM: Full    Dental  (+) Dental Advisory Given   Pulmonary  Hx aspiration pneumonia; hx tracheostomy   Pulmonary exam normal breath sounds clear to auscultation       Cardiovascular + CAD and + Past MI  Normal cardiovascular exam+ dysrhythmias Atrial Fibrillation  Rhythm:Regular Rate:Normal  TTE 09/2017 - EF was in the range of 60% to 65%. There is akinesis of the basalinferior myocardium. Trivial MR. RV cavity size was mildly dilated.    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 Unable to provide much history beyond what was found in chart review.  Reproductive/Obstetrics                           Anesthesia Physical Anesthesia Plan  ASA: III  Anesthesia Plan: General   Post-op Pain Management:    Induction: Intravenous  PONV Risk Score and Plan: 2 and Treatment may vary due to age or medical condition, Ondansetron and Dexamethasone  Airway Management Planned: LMA  Additional Equipment: None  Intra-op Plan:   Post-operative Plan: Extubation in OR  Informed Consent: I have reviewed the patients History and Physical, chart, labs and discussed the procedure including the risks,  benefits and alternatives for the proposed anesthesia with the patient or authorized representative who has indicated his/her understanding and acceptance.   Dental advisory given  Plan Discussed with: CRNA  Anesthesia Plan Comments:        Anesthesia Quick Evaluation

## 2018-01-26 NOTE — ED Notes (Addendum)
SURGERY MD AT BEDSIDE. THIS WRITER REQUESTED SURGEON REVIEW MEDICATIONS AND PLACE ON HOLD ANY MEDICATIONS.

## 2018-01-26 NOTE — Progress Notes (Signed)
Subjective/Chief Complaint: Not really communicative   Objective: Vital signs in last 24 hours: Temp:  [98.3 F (36.8 C)-99.4 F (37.4 C)] 98.3 F (36.8 C) (01/30 0800) Pulse Rate:  [79-95] 79 (01/30 0745) Resp:  [14-20] 16 (01/30 0608) BP: (110-132)/(76-92) 116/87 (01/30 0745) SpO2:  [94 %-100 %] 100 % (01/30 0745) Weight:  [119 kg (262 lb 5.6 oz)] 119 kg (262 lb 5.6 oz) (01/30 16100608)    Intake/Output from previous day: No intake/output data recorded. Intake/Output this shift: No intake/output data recorded.  Blue = location of g tube  Black = prior incisions.  Upper red ellipse is fluctuant abscess  Lower red circle is open wound with 1/4 " nugauze.       Lab Results:  Recent Labs    01/25/18 1755 01/26/18 0558  WBC 8.3 8.6  HGB 12.6* 11.3*  HCT 39.0 35.4*  PLT 174 162   BMET Recent Labs    01/25/18 1755 01/26/18 0558  NA 142 142  K 4.6 3.7  CL 107 109  CO2 26 27  GLUCOSE 92 80  BUN 15 13  CREATININE 1.05 0.87  CALCIUM 8.9 8.6*   PT/INR Recent Labs    01/25/18 2328  LABPROT 14.5  INR 1.14   ABG No results for input(s): PHART, HCO3 in the last 72 hours.  Invalid input(s): PCO2, PO2  Studies/Results: Ct Abdomen Pelvis W Contrast  Result Date: 01/25/2018 CLINICAL DATA:  Abscess near G-tube insertion site. Redness/swelling. History of right nephrectomy, peg tube placement, AKI EXAM: CT ABDOMEN AND PELVIS WITH CONTRAST TECHNIQUE: Multidetector CT imaging of the abdomen and pelvis was performed using the standard protocol following bolus administration of intravenous contrast. CONTRAST:  100mL ISOVUE-300 IOPAMIDOL (ISOVUE-300) INJECTION 61% COMPARISON:  CT abdomen dated 10/04/2017. FINDINGS: Lower chest: No acute abnormality. Hepatobiliary: No focal liver abnormality is seen. Status post cholecystectomy. No biliary dilatation. Pancreas: Unremarkable. No pancreatic ductal dilatation or surrounding inflammatory changes. Spleen: Normal in size  without focal abnormality. Adrenals/Urinary Tract: Right kidney is surgically absent. Adrenal glands appear normal. Stable hypodense mass within the upper pole of the left kidney, again measuring 2.3 cm, described as benign on earlier MRI abdomen. No left-sided hydronephrosis or perinephric fluid. No ureteral or bladder calculi. Stomach/Bowel: No dilated large or small bowel loops. No bowel wall thickening or evidence of bowel wall inflammation seen. Peg tube appears appropriately positioned in the stomach. Vascular/Lymphatic: Mild aortic atherosclerosis. No enlarged lymph nodes seen. Reproductive: Prostate is unremarkable. Other: Lobular abscess collection within the subcutaneous soft tissues of the anterior abdominal wall, supraumbilical, measuring 8.7 x 6 x 6.1 cm (transverse by AP by craniocaudal dimensions). The abscess collection involves the underlying anterior abdominal wall rectus musculature but does not extend into the intraperitoneal abdomen. No intraperitoneal free fluid or abscess collection. No free intraperitoneal air. 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 No acute or suspicious osseous finding. IMPRESSION: 1. Anterior abdominal wall abscess collection, supraumbilical near the midline, measuring 8.7 x 6 x 6.1 cm, centered within the subcutaneous soft tissues but extending into the underlying anterior abdominal wall rectus musculature. No intraperitoneal extension. 2. The abscess collection within the anterior abdominal wall is located approximately 3 cm to the right of the PEG tube insertion site. The abscess does not appear to involve the PEG tube insertion site. 3. Peg tube appears adequately positioned in the stomach. 4. Right kidney is surgically absent. 5. Aortic atherosclerosis. 6. 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 Electronically Signed   By: Bary Richard M.D.   On: 01/25/2018 20:18    Anti-infectives: Anti-infectives (From admission, onward)   Start     Dose/Rate Route Frequency Ordered Stop   01/26/18 1200  vancomycin (VANCOCIN) 1,250 mg in sodium chloride 0.9 % 250 mL IVPB     1,250 mg 166.7 mL/hr over 90 Minutes Intravenous Every 12 hours 01/26/18 0728     01/26/18 1000  vancomycin (VANCOCIN) 1,750 mg in sodium chloride 0.9 % 500 mL IVPB  Status:  Discontinued     1,750 mg 250 mL/hr over 120 Minutes Intravenous Every 12 hours 01/26/18 0650 01/26/18 0728   01/25/18 2245  meropenem (MERREM) 1 g in sodium chloride 0.9 % 100 mL IVPB     1 g 200 mL/hr over 30 Minutes Intravenous Every 8 hours 01/25/18 2242     01/25/18 2100  vancomycin (VANCOCIN) 2,000 mg in sodium chloride 0.9 % 500 mL IVPB     2,000 mg 250 mL/hr over 120 Minutes Intravenous  Once 01/25/18 2043 01/26/18 0127      Assessment/Plan: I and D abdominal wall abscess  Plan or today, discussed with patients mother [name] cell is [phone]  [name] 01/26/2018

## 2018-01-26 NOTE — Progress Notes (Signed)
Patient Demographics:    [name], is a 52 y.o. male, DOB - July 10, 1966, CWC:[id number]  Admit date - 01/25/2018   Admitting Physician Delano Metz, MD  Outpatient Primary MD for the patient is Georgann Housekeeper, MD  LOS - 1   Chief Complaint  Patient presents with  . Abscess        Subjective:    [name] today has no fevers, no emesis,  No chest pain,   No cough  Assessment  & Plan :    Active Problems:   Abdominal wall abscess  Brief Summary :- 52 y.o. 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 fib not on systemic anticoagulation and adrenal insufficiency who presents to the ED from his significant worsening abdominal wall abscess.  Patient is afebrile without leukocytosis however CT abdomen and pelvis and clinical exam reveals a rather large anterior abdominal wall abscess without involvement of the PEG tube site or involvement of PEG tube tract.  Patient failed oral doxycycline prior to admission   Plan:- 1)Anterior abd wall Abscess-status post I&D on 01/26/2018, continue Vanco and meropenem pending steroids/OR culture results,   2)History of hypothyroidism and adrenal insufficiency-a.m. serum cortisol was 5.3, TSH is 0.3, give stress dose Solu-Cortef, reduce levothyroxine to 175 mcg from 200 mcg.   3)Social/Ethics-patient with prior history of anoxic brain injury with some cognitive deficits,  patient is a full code, patients mother Talbert Forest cell is [phone] is the  decision maker  4)NeuroPsych- patient with prior history of anoxic brain injury with some cognitive deficits, no significant behavioral disturbance at this time, continue Depakote, and Risperdal, Paxil and trazodone  5)SZ Disorder-stop tramadol, due to increased risk of seizures, continue  Depakote  6)H/o CAD/Prior MI subsequent anoxic brain injury-continue metoprolol, ?????? by patient is not on statin or aspirin  7)FEN-patient developed hypoglycemia while n.p.o. with PEG tube feeds on hold, treated with D50 solution, continue IVF D5 half-normal until  PEG tube feedings are resumed postop.  Disposition Plan  : TBD  Consults  :  Gen Surgery  DVT Prophylaxis  :  Lovenox   Lab Results  Component Value Date   PLT 162 01/26/2018    Inpatient Medications  Scheduled Meds: . divalproex  500 mg Oral TID  . docusate sodium  100 mg Oral BID  . enoxaparin (LOVENOX) injection  40 mg Subcutaneous QHS  . hydrocortisone sod succinate (SOLU-CORTEF) inj  50 mg Intravenous Q8H  . [START ON 01/27/2018] levothyroxine  175 mcg Oral QAC breakfast  . metoprolol tartrate  12.5 mg Per Tube BID  . multivitamin  15 mL Per Tube Q1200  . pantoprazole sodium  40 mg Per Tube Q1200  . PARoxetine  20 mg Oral Daily  . polyethylene glycol  17 g Oral BID  . polyvinyl alcohol  1 drop Both Eyes QID  . risperiDONE  2 mg Sublingual BID  . sennosides  15 mL Oral BID  . traZODone  50 mg Oral BID   Continuous Infusions: . dextrose 5 % and 0.45% NaCl 125 mL/hr at 01/26/18 1009  . meropenem (MERREM) IV Stopped (01/26/18 1449)  . vancomycin Stopped (01/26/18 1247)   PRN Meds:.  Anti-infectives (From admission, onward)   Start     Dose/Rate Route Frequency Ordered Stop   01/26/18 1200  vancomycin (VANCOCIN) 1,250 mg in sodium chloride 0.9 % 250 mL IVPB     1,250 mg 166.7 mL/hr over 90 Minutes Intravenous Every 12 hours 01/26/18 0728     01/26/18 1000  vancomycin (VANCOCIN) 1,750 mg in sodium chloride 0.9 % 500 mL IVPB  Status:  Discontinued     1,750 mg 250 mL/hr over 120 Minutes Intravenous Every 12 hours 01/26/18 0650 01/26/18 0728   01/25/18 2245  meropenem (MERREM) 1 g in sodium chloride 0.9 % 100 mL IVPB     1 g 200 mL/hr over 30 Minutes Intravenous Every 8 hours 01/25/18 2242      01/25/18 2100  vancomycin (VANCOCIN) 2,000 mg in sodium chloride 0.9 % 500 mL IVPB     2,000 mg 250 mL/hr over 120 Minutes Intravenous  Once 01/25/18 2043 01/26/18 0127        Objective:   Vitals:   01/26/18 1130 01/26/18 1212 01/26/18 1303 01/26/18 1413  BP: 104/75 108/75 102/73 113/76  Pulse: 65 79 70 64  Resp:  18  18  Temp:  97.8 F (36.6 C)  97.8 F (36.6 C)  TempSrc:  Oral  Oral  SpO2: 96% 96%  96%  Weight:        Wt Readings from Last 3 Encounters:  01/26/18 119 kg (262 lb 5.6 oz)  10/07/17 119.9 kg (264 lb 6.4 oz)  06/07/17 124.7 kg (275 lb)     Intake/Output Summary (Last 24 hours) at 01/26/2018 1553 Last data filed at 01/26/2018 1300 Gross per 24 hour  Intake 1000 ml  Output -  Net 1000 ml     Physical Exam  Gen:- Awake Alert, able to answer questions, follows commands HEENT:- [place].AT, No sclera icterus Neck-Supple Neck,No JVD,.  Lungs-  CTAB  CV- S1, S2 normal Abd-  +ve B.Sounds, Abd Soft, scars from prior abdominal surgeries noted, PEG tube site intact, in the periumbilical area right of the PEG tube site is an area of induration, erythema, warmth and tenderness consistent with abscess Neuro-generalized weakness and neuromuscular deficits at baseline Psych-cognitive deficits consistent with anoxic brain injury with history of behavioral disturbance    Data Review:   Micro Results Recent Results (from the past 240 hour(s))  Culture, blood (routine x 2)     Status: None (Preliminary result)   Collection Time: 01/25/18  6:18 PM  Result Value Ref Range Status   Specimen Description BLOOD LEFT HAND  Final   Special Requests IN PEDIATRIC BOTTLE Blood Culture adequate volume  Final   Culture  Setup Time   Final    GRAM POSITIVE COCCI IN PEDIATRIC BOTTLE CRITICAL VALUE NOTED.  VALUE IS CONSISTENT WITH PREVIOUSLY REPORTED AND CALLED VALUE. Performed at [facility], [address] [place], [place] [address]    Culture GRAM POSITIVE COCCI  Final    Report Status PENDING  Incomplete  Culture, blood (routine x 2)     Status: None (Preliminary result)   Collection Time: 01/25/18  6:18 PM  Result Value Ref Range Status   Specimen Description BLOOD RIGHT HAND  Final   Special Requests IN PEDIATRIC BOTTLE Blood Culture adequate volume  Final   Culture  Setup Time   Final    GRAM POSITIVE COCCI IN PEDIATRIC BOTTLE Organism ID to follow Performed at [facility], [address] [place], [place] [address]  Culture GRAM POSITIVE COCCI  Final   Report Status PENDING  Incomplete  Surgical PCR screen     Status: None   Collection Time: 01/26/18  1:20 PM  Result Value Ref Range Status   MRSA, PCR NEGATIVE NEGATIVE Final   Staphylococcus aureus NEGATIVE NEGATIVE Final    Comment: (NOTE) The Xpert SA Assay (FDA approved for NASAL specimens in patients 87 years of age and older), is one component of a comprehensive surveillance program. It is not intended to diagnose infection nor to guide or monitor treatment.     Radiology Reports Ct Abdomen Pelvis W Contrast  Result Date: 01/25/2018 CLINICAL DATA:  Abscess near G-tube insertion site. Redness/swelling. History of right nephrectomy, peg tube placement, AKI EXAM: CT ABDOMEN AND PELVIS WITH CONTRAST TECHNIQUE: Multidetector CT imaging of the abdomen and pelvis was performed using the standard protocol following bolus administration of intravenous contrast. CONTRAST:  ISOVUE-300 IOPAMIDOL (ISOVUE-300) INJECTION 61% COMPARISON:  CT abdomen dated 10/04/2017. FINDINGS: Lower chest: No acute abnormality. Hepatobiliary: No focal liver abnormality is seen. Status post cholecystectomy. No biliary dilatation. Pancreas: Unremarkable. No pancreatic ductal dilatation or surrounding inflammatory changes. Spleen: Normal in size without focal abnormality. Adrenals/Urinary Tract: Right kidney is surgically absent. Adrenal glands appear normal. Stable hypodense mass within the upper pole of the left  kidney, again measuring 2.3 cm, described as benign on earlier MRI abdomen. No left-sided hydronephrosis or perinephric fluid. No ureteral or bladder calculi. Stomach/Bowel: No dilated large or small bowel loops. No bowel wall thickening or evidence of bowel wall inflammation seen. Peg tube appears appropriately positioned in the stomach. Vascular/Lymphatic: Mild aortic atherosclerosis. No enlarged lymph nodes seen. Reproductive: Prostate is unremarkable. Other: Lobular abscess collection within the subcutaneous soft tissues of the anterior abdominal wall, supraumbilical, measuring 8.7 x 6 x 6.1 cm (transverse by AP by craniocaudal dimensions). The abscess collection involves the underlying anterior abdominal wall rectus musculature but does not extend into the intraperitoneal abdomen. No intraperitoneal free fluid or abscess collection. No free intraperitoneal air. 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 No acute or suspicious osseous finding. IMPRESSION: 1. Anterior abdominal wall abscess collection, supraumbilical near the midline, measuring 8.7 x 6 x 6.1 cm, centered within the subcutaneous soft tissues but extending into the underlying anterior abdominal wall rectus musculature. No intraperitoneal extension. 2. The abscess collection within the anterior abdominal wall is located approximately 3 cm to the right of the PEG tube insertion site. The abscess does not appear to involve the PEG tube insertion site. 3. Peg tube appears adequately positioned in the stomach. 4. Right kidney is surgically absent. 5. Aortic atherosclerosis. 6. 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 Electronically Signed   By: Bary Richard M.D.   On: 01/25/2018 20:18     CBC Recent Labs  Lab 01/25/18 1755 01/26/18 0558  WBC 8.3 8.6  HGB 12.6* 11.3*   HCT 39.0 35.4*  PLT 174 162  MCV 83.7 82.7  MCH 27.0 26.4  MCHC 32.3 31.9  RDW 14.7 15.1  LYMPHSABS 1.9  --   MONOABS 1.0  --   EOSABS 0.1  --   BASOSABS 0.0  --     Chemistries  Recent Labs  Lab 01/25/18 1755 01/25/18 2328 01/26/18 0558  NA 142  --  142  K 4.6  --  3.7  CL 107  --  109  CO2 26  --  27  GLUCOSE 92  --  80  BUN 15  --  13  CREATININE 1.05  --  0.87  CALCIUM 8.9  --  8.6*  MG  --  1.6*  --   AST 15  --   --   ALT 9*  --   --   ALKPHOS 71  --   --   BILITOT 0.1*  --   --    ------------------------------------------------------------------------------------------------------------------ No results for input(s): CHOL, HDL, LDLCALC, TRIG, CHOLHDL, LDLDIRECT in the last 72 hours.  Lab Results  Component Value Date   HGBA1C 4.6 11/24/2011   ------------------------------------------------------------------------------------------------------------------ Recent Labs    01/25/18 2328  TSH 0.338*   ------------------------------------------------------------------------------------------------------------------ No results for input(s): VITAMINB12, FOLATE, FERRITIN, TIBC, IRON, RETICCTPCT in the last 72 hours.  Coagulation profile Recent Labs  Lab 01/25/18 2328  INR 1.14    No results for input(s): DDIMER in the last 72 hours.  Cardiac Enzymes No results for input(s): CKMB, TROPONINI, MYOGLOBIN in the last 168 hours.  Invalid input(s): CK ------------------------------------------------------------------------------------------------------------------ No results found for: BNP   Shon Hale M.D on 01/26/2018 at 3:53 PM  Between 7am to 7pm - Pager - [phone]  After 7pm go to [email or web address] - password [email or web address]  [facility] -  Office  [phone]   Voice Recognition Reubin Milan dictation system was used to create this note, attempts have been made to correct errors. Please contact the author with questions and/or clarifications.

## 2018-01-26 NOTE — H&P (View-Only) (Signed)
Subjective/Chief Complaint: Not really communicative   Objective: Vital signs in last 24 hours: Temp:  [98.3 F (36.8 C)-99.4 F (37.4 C)] 98.3 F (36.8 C) (01/30 0800) Pulse Rate:  [79-95] 79 (01/30 0745) Resp:  [14-20] 16 (01/30 0608) BP: (110-132)/(76-92) 116/87 (01/30 0745) SpO2:  [94 %-100 %] 100 % (01/30 0745) Weight:  [119 kg (262 lb 5.6 oz)] 119 kg (262 lb 5.6 oz) (01/30 16100608)    Intake/Output from previous day: No intake/output data recorded. Intake/Output this shift: No intake/output data recorded.  Blue = location of g tube  Black = prior incisions.  Upper red ellipse is fluctuant abscess  Lower red circle is open wound with 1/4 " nugauze.       Lab Results:  Recent Labs    01/25/18 1755 01/26/18 0558  WBC 8.3 8.6  HGB 12.6* 11.3*  HCT 39.0 35.4*  PLT 174 162   BMET Recent Labs    01/25/18 1755 01/26/18 0558  NA 142 142  K 4.6 3.7  CL 107 109  CO2 26 27  GLUCOSE 92 80  BUN 15 13  CREATININE 1.05 0.87  CALCIUM 8.9 8.6*   PT/INR Recent Labs    01/25/18 2328  LABPROT 14.5  INR 1.14   ABG No results for input(s): PHART, HCO3 in the last 72 hours.  Invalid input(s): PCO2, PO2  Studies/Results: Ct Abdomen Pelvis W Contrast  Result Date: 01/25/2018 CLINICAL DATA:  Abscess near G-tube insertion site. Redness/swelling. History of right nephrectomy, peg tube placement, AKI EXAM: CT ABDOMEN AND PELVIS WITH CONTRAST TECHNIQUE: Multidetector CT imaging of the abdomen and pelvis was performed using the standard protocol following bolus administration of intravenous contrast. CONTRAST:  100mL ISOVUE-300 IOPAMIDOL (ISOVUE-300) INJECTION 61% COMPARISON:  CT abdomen dated 10/04/2017. FINDINGS: Lower chest: No acute abnormality. Hepatobiliary: No focal liver abnormality is seen. Status post cholecystectomy. No biliary dilatation. Pancreas: Unremarkable. No pancreatic ductal dilatation or surrounding inflammatory changes. Spleen: Normal in size  without focal abnormality. Adrenals/Urinary Tract: Right kidney is surgically absent. Adrenal glands appear normal. Stable hypodense mass within the upper pole of the left kidney, again measuring 2.3 cm, described as benign on earlier MRI abdomen. No left-sided hydronephrosis or perinephric fluid. No ureteral or bladder calculi. Stomach/Bowel: No dilated large or small bowel loops. No bowel wall thickening or evidence of bowel wall inflammation seen. Peg tube appears appropriately positioned in the stomach. Vascular/Lymphatic: Mild aortic atherosclerosis. No enlarged lymph nodes seen. Reproductive: Prostate is unremarkable. Other: Lobular abscess collection within the subcutaneous soft tissues of the anterior abdominal wall, supraumbilical, measuring 8.7 x 6 x 6.1 cm (transverse by AP by craniocaudal dimensions). The abscess collection involves the underlying anterior abdominal wall rectus musculature but does not extend into the intraperitoneal abdomen. No intraperitoneal free fluid or abscess collection. No free intraperitoneal air. 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 No acute or suspicious osseous finding. IMPRESSION: 1. Anterior abdominal wall abscess collection, supraumbilical near the midline, measuring 8.7 x 6 x 6.1 cm, centered within the subcutaneous soft tissues but extending into the underlying anterior abdominal wall rectus musculature. No intraperitoneal extension. 2. The abscess collection within the anterior abdominal wall is located approximately 3 cm to the right of the PEG tube insertion site. The abscess does not appear to involve the PEG tube insertion site. 3. Peg tube appears adequately positioned in the stomach. 4. Right kidney is surgically absent. 5. Aortic atherosclerosis. 6. 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 Electronically Signed   By: Bary Richard M.D.   On: 01/25/2018 20:18    Anti-infectives: Anti-infectives (From admission, onward)   Start     Dose/Rate Route Frequency Ordered Stop   01/26/18 1200  vancomycin (VANCOCIN) 1,250 mg in sodium chloride 0.9 % 250 mL IVPB     1,250 mg 166.7 mL/hr over 90 Minutes Intravenous Every 12 hours 01/26/18 0728     01/26/18 1000  vancomycin (VANCOCIN) 1,750 mg in sodium chloride 0.9 % 500 mL IVPB  Status:  Discontinued     1,750 mg 250 mL/hr over 120 Minutes Intravenous Every 12 hours 01/26/18 0650 01/26/18 0728   01/25/18 2245  meropenem (MERREM) 1 g in sodium chloride 0.9 % 100 mL IVPB     1 g 200 mL/hr over 30 Minutes Intravenous Every 8 hours 01/25/18 2242     01/25/18 2100  vancomycin (VANCOCIN) 2,000 mg in sodium chloride 0.9 % 500 mL IVPB     2,000 mg 250 mL/hr over 120 Minutes Intravenous  Once 01/25/18 2043 01/26/18 0127      Assessment/Plan: I and D abdominal wall abscess  Plan or today, discussed with patients mother Talbert Forest cell is [phone]  Emelia Loron 01/26/2018

## 2018-01-27 ENCOUNTER — Encounter (HOSPITAL_COMMUNITY): Payer: Self-pay | Admitting: Surgery

## 2018-01-27 LAB — GLUCOSE, CAPILLARY
Glucose-Capillary: 167 mg/dL — ABNORMAL HIGH (ref 65–99)
Glucose-Capillary: 138 mg/dL — ABNORMAL HIGH (ref 65–99)
Glucose-Capillary: 95 mg/dL (ref 65–99)

## 2018-01-27 MED ORDER — METHOCARBAMOL 1000 MG/10ML IJ SOLN
1000.0000 mg | Freq: Four times a day (QID) | INTRAVENOUS | Status: DC | PRN
  Filled 2018-01-27: qty 10

## 2018-01-27 MED ORDER — TRAMADOL HCL 50 MG PO TABS
50.0000 mg | ORAL_TABLET | Freq: Four times a day (QID) | ORAL | Status: DC | PRN
  Administered 2018-01-27: 50 mg via ORAL
  Administered 2018-01-28: 100 mg via ORAL
  Filled 2018-01-27 (×2): qty 2

## 2018-01-27 MED ORDER — LORATADINE 10 MG PO TABS
10.0000 mg | ORAL_TABLET | Freq: Every day | ORAL | Status: DC
  Administered 2018-01-27 – 2018-01-30 (×3): 10 mg
  Filled 2018-01-27 (×3): qty 1

## 2018-01-27 MED ORDER — VITAL 1.5 CAL PO LIQD
1000.0000 mL | ORAL | Status: DC
  Administered 2018-01-27 – 2018-01-30 (×4): 1000 mL
  Filled 2018-01-27 (×7): qty 1000

## 2018-01-27 MED ORDER — FENTANYL CITRATE (PF) 100 MCG/2ML IJ SOLN
25.0000 ug | INTRAMUSCULAR | Status: DC | PRN
  Administered 2018-01-27 – 2018-01-29 (×3): 25 ug via INTRAVENOUS
  Filled 2018-01-27 (×3): qty 2

## 2018-01-27 MED ORDER — HYDROCORTISONE NA SUCCINATE PF 100 MG IJ SOLR
50.0000 mg | Freq: Two times a day (BID) | INTRAMUSCULAR | Status: DC
  Administered 2018-01-28 (×2): 50 mg via INTRAVENOUS
  Filled 2018-01-27 (×3): qty 1

## 2018-01-27 MED ORDER — ACETAMINOPHEN 325 MG PO TABS
650.0000 mg | ORAL_TABLET | ORAL | Status: DC | PRN
  Administered 2018-01-27: 650 mg
  Filled 2018-01-27 (×2): qty 2

## 2018-01-27 NOTE — Progress Notes (Signed)
Initial Nutrition Assessment  DOCUMENTATION CODES:   Obesity unspecified  INTERVENTION:   D/c Jevity 1.2  Switch to Vital 1.5 @ 55 ml/hr via PEG-providing 1980 kcal, 89g protein and  1008 ml H2O.  Once diet is advanced, can transition back to bolus feed regimen as pt primarily uses tube if consuming <50% of meals.  NUTRITION DIAGNOSIS:   Increased nutrient needs related to wound healing as evidenced by estimated needs.  GOAL:   Patient will meet greater than or equal to 90% of their needs  MONITOR:   Diet advancement, Labs, TF tolerance, I & O's, Weight trends  REASON FOR ASSESSMENT:   Consult Enteral/tube feeding initiation and management  ASSESSMENT:   52 y.o. 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 fib not on systemic anticoagulation, GERD and adrenal insufficiency who presents to the ED from his significant worsening abdominal wall abscess.    Patient in room with no family at bedside. Pt non-communicative during visit. Was able to nod head yes or no. Pt currently receiving Jevity 1.2 @ 50 ml/hr via PEG tube, tolerating. Spoke with pt's mother, Talbert ForestShirley, on phone, reports pt was eating well at nursing facility. The only concern was that he sometimes chokes on food. She reports pt will eat food very quickly. Pt was eating a regular diet and was not using his tube unless he wasn't eating over 50% of meals. Tube was mainly used for free water and medication administration.  Will keep continuous feeds for now while pt is NPO. Once diet is advanced, can transition back to bolus feeds to allow for oral diet.  Per chart review, pt has lost 11 lb since October 2018, insignificant for time frame. Pt's mother states that the weight he is at now is actually higher than it has been.  Medications: Colace capsule BID, Reglan solution per tube, Liquid MVI daily,  Miralax packet BID, Senokot syrup BID, D5 -.45% NaCl infusion at 50 ml/hr -provides 204 kcal Labs reviewed: CBGs: 138-167  NUTRITION - FOCUSED PHYSICAL EXAM:  Nutrition focused physical exam shows no sign of depletion of muscle mass or body fat.  Diet Order:  Diet NPO time specified Except for: Sips with Meds  EDUCATION NEEDS:   No education needs have been identified at this time  Skin:  Skin Assessment: Skin Integrity Issues: Skin Integrity Issues:: Other (Comment) Other: non-pressure abdominal wounds: around PEG site and lower abdomen  Last BM:  1/30  Height:   Ht Readings from Last 1 Encounters:  10/03/17 5\' 11"  (1.803 m)    Weight:   Wt Readings from Last 1 Encounters:  01/27/18 253 lb 8 oz (115 kg)    Ideal Body Weight:  70.3 kg  BMI:  Body mass index is 35.36 kg/m.  Estimated Nutritional Needs:   Kcal:  2000-2200  Protein:  85-95g  Fluid:  2L/day  Tilda FrancoLindsey Nickcole Bralley, MS, RD, LDN [facility] Clinical Dietitian Pager: [phone] After Hours Pager: [phone]

## 2018-01-27 NOTE — Progress Notes (Signed)
Patient Demographics:    [name], is a 52 y.o. male, DOB - 1966/07/16, [name]  Admit date - 01/25/2018   Admitting Physician Delano Metzobert Schertz, MD  Outpatient Primary MD for the patient is Georgann HousekeeperHusain, Karrar, MD  LOS - 2   Chief Complaint  Patient presents with  . 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 fib not on systemic anticoagulation and adrenal insufficiency who presents to the ED from his significant worsening abdominal wall abscess.  Patient is afebrile without leukocytosis however CT abdomen and pelvis and clinical exam reveals a rather large anterior abdominal wall abscess without involvement of the PEG tube site or involvement of PEG tube tract.  Patient failed oral doxycycline prior to admission   Plan:- 1)Anterior abd wall Abscess-status post I&D on 01/26/2018, prelim OR wound gramstain with gram-positive cocci clusters, continue wound packing per general surgery, continue Vanco and meropenem pending final sterile/OR culture results, preliminary blood cultures from pediatric tube on 01/25/18 with methicillin-resistant coagulase-negative staph, repeat blood cultures from 01/27/2017 pending  2)History of hypothyroidism and adrenal insufficiency- a.m. serum cortisol was 5.3 (low) on 01/26/18, TSH is 0.3, getting stress dose  Solu-Cortef, reduced levothyroxine to 175 mcg from 200 mcg.   3)Social/Ethics-patient with prior history of anoxic brain injury with some cognitive deficits,  patient is a full code, patients mother Talbert ForestShirley cell is [phone] is the  decision maker  4)NeuroPsych- patient with prior history of anoxic brain injury with some cognitive deficits, no significant behavioral disturbance at this time, continue Depakote, and Risperdal, Paxil and trazodone  5)SZ Disorder- stopped tramadol, due to increased risk of seizures, continue Depakote  6)H/o CAD/Prior MI subsequent anoxic brain injury-continue metoprolol 5 mg twice daily, ?????? by patient is not on statin or aspirin  7)FEN- No further hypoglycemia  , wean off IVF D5 half-normal , dietitian recommends   PEG tube feedings with Vital 1.5 @ 55 ml/hr via PEG-providing 1980 kcal, 89g protein and  1008 ml H2O for now, Once diet is advanced, can transition back to bolus feed with Jevity 1.2 regimen as pt primarily uses tube if consuming <50% of meals  Disposition Plan  : TBD  Consults  :  Gen Surgery/dietitian  DVT Prophylaxis  :  Lovenox   Lab Results  Component Value Date   PLT 162 01/26/2018    Inpatient Medications  Scheduled Meds: . divalproex  500 mg Oral TID  . docusate sodium  100 mg Oral BID  . enoxaparin (LOVENOX) injection  40 mg Subcutaneous QHS  . hydrocortisone sod succinate (SOLU-CORTEF) inj  50 mg Intravenous Q8H  . hydrocortisone sod succinate (SOLU-CORTEF)  inj  50 mg Intravenous Q12H  . levothyroxine  175 mcg Oral QAC breakfast  . loratadine  10 mg Per Tube Daily  . metoCLOPramide  10 mg Per Tube Q6H  . metoprolol tartrate  12.5 mg Per Tube BID  . multivitamin  15 mL Per Tube Q1200  . pantoprazole sodium  40 mg Per Tube Q1200  . PARoxetine  20 mg Oral Daily  . polyethylene glycol  17 g Oral BID  . polyvinyl alcohol  1 drop Both Eyes QID  . risperiDONE  2 mg Sublingual BID  . sennosides  15 mL Oral BID  . traZODone   50 mg Oral BID   Continuous Infusions: . dextrose 5 % and 0.45% NaCl 20 mL/hr at 01/27/18 1537  . feeding supplement (VITAL 1.5 CAL)    . meropenem (MERREM) IV 1 g (01/27/18 1531)  . methocarbamol (ROBAXIN)  IV    . vancomycin Stopped (01/27/18 0316)   PRN Meds:.    Anti-infectives (From admission, onward)   Start     Dose/Rate Route Frequency Ordered Stop   01/26/18 1200  vancomycin (VANCOCIN) 1,250 mg in sodium chloride 0.9 % 250 mL IVPB     1,250 mg 166.7 mL/hr over 90 Minutes Intravenous Every 12 hours 01/26/18 0728     01/26/18 1000  vancomycin (VANCOCIN) 1,750 mg in sodium chloride 0.9 % 500 mL IVPB  Status:  Discontinued     1,750 mg 250 mL/hr over 120 Minutes Intravenous Every 12 hours 01/26/18 0650 01/26/18 0728   01/25/18 2245  meropenem (MERREM) 1 g in sodium chloride 0.9 % 100 mL IVPB     1 g 200 mL/hr over 30 Minutes Intravenous Every 8 hours 01/25/18 2242     01/25/18 2100  vancomycin (VANCOCIN) 2,000 mg in sodium chloride 0.9 % 500 mL IVPB     2,000 mg 250 mL/hr over 120 Minutes Intravenous  Once 01/25/18 2043 01/26/18 0127        Objective:   Vitals:   01/27/18 0400 01/27/18 0428 01/27/18 1057 01/27/18 1337  BP:  106/72 115/68 115/70  Pulse:  75 77 74  Resp:  18 18 18   Temp:  (!) 97.5 F (36.4 C) 97.9 F (36.6 C) 97.9 F (36.6 C)  TempSrc:  Oral Oral Oral  SpO2:  95% 95% 98%  Weight: 113.1 kg (249 lb 6.4 oz) 115 kg (253 lb 8 oz)      Wt Readings from Last 3 Encounters:  01/27/18 115 kg (253 lb 8 oz)  10/07/17 119.9 kg (264 lb 6.4 oz)  06/07/17 124.7 kg (275 lb)     Intake/Output Summary (Last 24 hours) at 01/27/2018 1548 Last data filed at 01/27/2018 1338 Gross per 24 hour  Intake 3143.74 ml  Output 500 ml  Net 2643.74 ml     Physical Exam  Gen:- Awake Alert, able to answer questions, follows commands HEENT:- [email or web address], No sclera icterus Neck-Supple Neck,No JVD,.  Lungs-  CTAB  CV- S1, S2 normal Abd-  +ve B.Sounds, Abd Soft, scars from  prior abdominal surgeries noted, PEG tube site intact, postop wound areas with packing  Neuro-generalized weakness and neuromuscular deficits at baseline Psych-cognitive deficits consistent with anoxic brain injury with history of behavioral disturbance    Data Review:   Micro Results Recent Results (from the past 240 hour(s))  Culture, blood (routine x 2)     Status: None (Preliminary result)   Collection Time: 01/25/18  6:18 PM  Result Value Ref Range Status  Specimen Description BLOOD LEFT HAND  Final   Special Requests IN PEDIATRIC BOTTLE Blood Culture adequate volume  Final   Culture  Setup Time   Final    GRAM POSITIVE COCCI IN PEDIATRIC BOTTLE CRITICAL VALUE NOTED.  VALUE IS CONSISTENT WITH PREVIOUSLY REPORTED AND CALLED VALUE.    Culture   Final    GRAM POSITIVE COCCI IDENTIFICATION AND SUSCEPTIBILITIES TO FOLLOW Performed at [facility], [address], [place], [place] [address]    Report Status PENDING  Incomplete  Culture, blood (routine x 2)     Status: None (Preliminary result)   Collection Time: 01/25/18  6:18 PM  Result Value Ref Range Status   Specimen Description BLOOD RIGHT HAND  Final   Special Requests IN PEDIATRIC BOTTLE Blood Culture adequate volume  Final   Culture  Setup Time   Final    GRAM POSITIVE COCCI IN PEDIATRIC BOTTLE CRITICAL RESULT CALLED TO, READ BACK BY AND VERIFIED WITH: M. Lilliston Pharm.D. 17:10 01/26/18 ([email or web address])    Culture   Final    GRAM POSITIVE COCCI IDENTIFICATION AND SUSCEPTIBILITIES TO FOLLOW Performed at [facility], [address], [place], [place] [address]    Report Status PENDING  Incomplete  Blood Culture ID Panel (Reflexed)     Status: Abnormal   Collection Time: 01/25/18  6:18 PM  Result Value Ref Range Status   Enterococcus species NOT DETECTED NOT DETECTED Final   Listeria monocytogenes NOT DETECTED NOT DETECTED Final   Staphylococcus species DETECTED (A) NOT DETECTED Final    Comment: Methicillin  (oxacillin) resistant coagulase negative staphylococcus. Possible blood culture contaminant (unless isolated from more than one blood culture draw or clinical case suggests pathogenicity). No antibiotic treatment is indicated for blood  culture contaminants. CRITICAL RESULT CALLED TO, READ BACK BY AND VERIFIED WITH: M. Lilliston Pharm.D. 17:10 01/26/18 ([email or web address])    Staphylococcus aureus NOT DETECTED NOT DETECTED Final   Methicillin resistance DETECTED (A) NOT DETECTED Final    Comment: CRITICAL RESULT CALLED TO, READ BACK BY AND VERIFIED WITH: M. Lilliston Pharm.D. 17:10 01/26/18 ([email or web address])    Streptococcus species NOT DETECTED NOT DETECTED Final   Streptococcus agalactiae NOT DETECTED NOT DETECTED Final   Streptococcus pneumoniae NOT DETECTED NOT DETECTED Final   Streptococcus pyogenes NOT DETECTED NOT DETECTED Final   Acinetobacter baumannii NOT DETECTED NOT DETECTED Final   Enterobacteriaceae species NOT DETECTED NOT DETECTED Final   Enterobacter cloacae complex NOT DETECTED NOT DETECTED Final   Escherichia coli NOT DETECTED NOT DETECTED Final   Klebsiella oxytoca NOT DETECTED NOT DETECTED Final   Klebsiella pneumoniae NOT DETECTED NOT DETECTED Final   Proteus species NOT DETECTED NOT DETECTED Final   Serratia marcescens NOT DETECTED NOT DETECTED Final   Haemophilus influenzae NOT DETECTED NOT DETECTED Final   Neisseria meningitidis NOT DETECTED NOT DETECTED Final   Pseudomonas aeruginosa NOT DETECTED NOT DETECTED Final   Candida albicans NOT DETECTED NOT DETECTED Final   Candida glabrata NOT DETECTED NOT DETECTED Final   Candida krusei NOT DETECTED NOT DETECTED Final   Candida parapsilosis NOT DETECTED NOT DETECTED Final   Candida tropicalis NOT DETECTED NOT DETECTED Final  Surgical PCR screen     Status: None   Collection Time: 01/26/18  1:20 PM  Result Value Ref Range Status   MRSA, PCR NEGATIVE NEGATIVE Final   Staphylococcus aureus NEGATIVE NEGATIVE Final    Comment:  (NOTE) The Xpert SA Assay (FDA approved for NASAL specimens in patients 22 years of  age and older), is one component of a comprehensive surveillance program. It is not intended to diagnose infection nor to guide or monitor treatment.   Aerobic/Anaerobic Culture (surgical/deep wound)     Status: None (Preliminary result)   Collection Time: 01/26/18  6:23 PM  Result Value Ref Range Status   Specimen Description ABSCESS ABDOMEN  Final   Special Requests NONE  Final   Gram Stain   Final    RARE WBC PRESENT, PREDOMINANTLY PMN MODERATE GRAM POSITIVE COCCI IN PAIRS IN CLUSTERS    Culture   Final    TOO YOUNG TO READ Performed at [facility], [address], [place], [place] [address]    Report Status PENDING  Incomplete    Radiology Reports Ct Abdomen Pelvis W Contrast  Result Date: 01/25/2018 CLINICAL DATA:  Abscess near G-tube insertion site. Redness/swelling. History of right nephrectomy, peg tube placement, AKI EXAM: CT ABDOMEN AND PELVIS WITH CONTRAST TECHNIQUE: Multidetector CT imaging of the abdomen and pelvis was performed using the standard protocol following bolus administration of intravenous contrast. CONTRAST:  ISOVUE-300 IOPAMIDOL (ISOVUE-300) INJECTION 61% COMPARISON:  CT abdomen dated 10/04/2017. FINDINGS: Lower chest: No acute abnormality. Hepatobiliary: No focal liver abnormality is seen. Status post cholecystectomy. No biliary dilatation. Pancreas: Unremarkable. No pancreatic ductal dilatation or surrounding inflammatory changes. Spleen: Normal in size without focal abnormality. Adrenals/Urinary Tract: Right kidney is surgically absent. Adrenal glands appear normal. Stable hypodense mass within the upper pole of the left kidney, again measuring 2.3 cm, described as benign on earlier MRI abdomen. No left-sided hydronephrosis or perinephric fluid. No ureteral or bladder calculi. Stomach/Bowel: No dilated large or small bowel loops. No bowel wall thickening or evidence  of bowel wall inflammation seen. Peg tube appears appropriately positioned in the stomach. Vascular/Lymphatic: Mild aortic atherosclerosis. No enlarged lymph nodes seen. Reproductive: Prostate is unremarkable. Other: Lobular abscess collection within the subcutaneous soft tissues of the anterior abdominal wall, supraumbilical, measuring 8.7 x 6 x 6.1 cm (transverse by AP by craniocaudal dimensions). The abscess collection involves the underlying anterior abdominal wall rectus musculature but does not extend into the intraperitoneal abdomen. No intraperitoneal free fluid or abscess collection. No free intraperitoneal air. 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 No acute or suspicious osseous finding. IMPRESSION: 1. Anterior abdominal wall abscess collection, supraumbilical near the midline, measuring 8.7 x 6 x 6.1 cm, centered within the subcutaneous soft tissues but extending into the underlying anterior abdominal wall rectus musculature. No intraperitoneal extension. 2. The abscess collection within the anterior abdominal wall is located approximately 3 cm to the right of the PEG tube insertion site. The abscess does not appear to involve the PEG tube insertion site. 3. Peg tube appears adequately positioned in the stomach. 4. Right kidney is surgically absent. 5. Aortic atherosclerosis. 6. 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 Electronically Signed   By: Bary Richard M.D.   On: 01/25/2018 20:18     CBC Recent Labs  Lab 01/25/18 1755 01/26/18 0558  WBC 8.3 8.6  HGB 12.6* 11.3*  HCT 39.0 35.4*  PLT 174 162  MCV 83.7 82.7  MCH 27.0 26.4  MCHC 32.3 31.9  RDW 14.7 15.1  LYMPHSABS 1.9  --   MONOABS 1.0  --   EOSABS 0.1  --   BASOSABS 0.0  --     Chemistries  Recent  Labs  Lab 01/25/18 1755 01/25/18 2328  01/26/18 0558  NA 142  --  142  K 4.6  --  3.7  CL 107  --  109  CO2 26  --  27  GLUCOSE 92  --  80  BUN 15  --  13  CREATININE 1.05  --  0.87  CALCIUM 8.9  --  8.6*  MG  --  1.6*  --   AST 15  --   --   ALT 9*  --   --   ALKPHOS 71  --   --   BILITOT 0.1*  --   --    ------------------------------------------------------------------------------------------------------------------ No results for input(s): CHOL, HDL, LDLCALC, TRIG, CHOLHDL, LDLDIRECT in the last 72 hours.  Lab Results  Component Value Date   HGBA1C 4.6 11/24/2011   ------------------------------------------------------------------------------------------------------------------ Recent Labs    01/25/18 2328  TSH 0.338*   ------------------------------------------------------------------------------------------------------------------ No results for input(s): VITAMINB12, FOLATE, FERRITIN, TIBC, IRON, RETICCTPCT in the last 72 hours.  Coagulation profile Recent Labs  Lab 01/25/18 2328  INR 1.14    No results for input(s): DDIMER in the last 72 hours.  Cardiac Enzymes No results for input(s): CKMB, TROPONINI, MYOGLOBIN in the last 168 hours.  Invalid input(s): CK ------------------------------------------------------------------------------------------------------------------ No results found for: BNP   Shon Hale M.D on 01/27/2018 at 3:48 PM  Between 7am to 7pm - Pager - [phone]  After 7pm go to [email or web address] - password [email or web address]  [facility] -  Office  [phone]   Voice Recognition Reubin Milan dictation system was used to create this note, attempts have been made to correct errors. Please contact the author with questions and/or clarifications.

## 2018-01-27 NOTE — NC FL2 (Signed)
[place] MEDICAID FL2 LEVEL OF CARE SCREENING TOOL     IDENTIFICATION  Patient Name: [name] Birthdate: 08-11-1966 Sex: male Admission Date (Current Location): 01/25/2018  [facility] and [place] Number:  Producer, television/film/video and Address:  [facility],  [address] [place] [address]      Provider Number: [id number]  Attending Physician Name and Address:  Shon Hale, MD  Relative Name and Phone Number:       Current Level of Care: Hospital Recommended Level of Care: Skilled Nursing Facility Prior Approval Number:    Date Approved/Denied:   PASRR Number:   [id number]  Discharge Plan: SNF    Current Diagnoses: Patient Active Problem List   Diagnosis Date Noted  . PEG Gastrostomy tube in place (HCC) 01/26/2018  . 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 01/26/2018 01/26/2018  . Bedridden 01/26/2018  . Quadriplegia (HCC)   . Abdominal wall abscess 01/25/2018  . Fracture of humerus anatomical neck, right, closed, initial encounter 10/03/2017  . AKI (acute kidney injury) (HCC) 10/03/2017  . UTI (urinary tract infection) 10/03/2017  . Agitation 07/03/2014  . Cardiomyopathy, ischemic 06/18/2014  . 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 Acute psychosis (HCC) 04/19/2014  . Unspecified hypothyroidism 05/16/2013  . Constipation 05/16/2013  . Anemia of other chronic disease 05/16/2013  . Mental disorder 01/03/2013  . Addisons disease (HCC) 01/03/2013  . PEG (percutaneous endoscopic gastrostomy) adjustment/replacement/removal (HCC) 12/21/2011  . Hypokalemia 11/25/2011  . Thrombocytopenia (HCC) 11/25/2011  . FTT (failure to thrive) in adult 11/25/2011  . Grand mal seizure (HCC) 11/23/2011  . Depression (emotion) 11/23/2011  . History of atrial fibrillation 11/23/2011  . Other specified hemorrhagic conditions (HCC) 11/23/2011  . Glaucoma 11/23/2011  . Hypoglycemia 11/23/2011  . Chronic constipation 11/23/2011  . Anoxic brain injury  (HCC) 11/12/2011  . Protein calorie malnutrition (HCC) 11/12/2011  . Hyperchloremia 11/12/2011  . Hyperthyroidism 11/12/2011  . Acute lower UTI 11/12/2011  . Dysphagia     Orientation RESPIRATION BLADDER Height & Weight     Self  O2 Incontinent Weight: 253 lb 8 oz (115 kg) Height:     BEHAVIORAL SYMPTOMS/MOOD NEUROLOGICAL BOWEL NUTRITION STATUS      Incontinent Feeding tube  AMBULATORY STATUS COMMUNICATION OF NEEDS Skin   Extensive Assist Verbally Other (Comment)(incisions abdomen anterior at PEG site)                       Personal Care Assistance Level of Assistance  Bathing, Feeding, Dressing Bathing Assistance: Maximum Assistance Feeding assistance: Independent Dressing Assistance: Maximum Assistance      Functional Limitations Info  Sight, Hearing, Speech Sight Info: Adequate Hearing Info: Adequate Speech Info: Adequate    SPECIAL CARE FACTORS FREQUENCY                       Contractures Contractures Info: Not present    Additional Factors Info  Code Status, Allergies Code Status Info: Full Allergies Info: Ativan Lorazepam, Latex, Morphine And Related, Penicillins, Pyridium Phenazopyridine Hcl, Soy Allergy           Current Medications (01/27/2018):  This is the current hospital active medication list Current Facility-Administered Medications  Medication Dose Route Frequency Provider Last Rate Last Dose  . acetaminophen (TYLENOL) tablet 650 mg  650 mg Per Tube Q4H PRN Karie Soda, MD   650 mg at 01/27/18 0237  . dextrose 5 %-0.45 % sodium chloride infusion   Intravenous Continuous Karie Soda, MD  50 mL/hr at 01/27/18 0237 50 mL/hr at 01/27/18 0237  . divalproex (DEPAKOTE SPRINKLE) capsule 500 mg  500 mg Oral TID Marcelo BaldySchertz, Adam Ross, MD   500 mg at 01/26/18 2352  . docusate sodium (COLACE) capsule 100 mg  100 mg Oral BID Marcelo BaldySchertz, Adam Ross, MD   100 mg at 01/26/18 2356  . enoxaparin (LOVENOX) injection 40 mg  40 mg Subcutaneous QHS Marcelo BaldySchertz, Adam  Ross, MD   40 mg at 01/26/18 2356  . feeding supplement (JEVITY 1.2 CAL) liquid 1,000 mL  1,000 mL Per Tube Continuous Karie SodaGross, Steven, MD 50 mL/hr at 01/26/18 2357 1,000 mL at 01/26/18 2357  . fentaNYL (SUBLIMAZE) injection 25-50 mcg  25-50 mcg Intravenous Q1H PRN Karie SodaGross, Steven, MD   25 mcg at 01/27/18 [id number]  . hydrocortisone sodium succinate (SOLU-CORTEF) 100 MG injection 50 mg  50 mg Intravenous Q8H Emokpae, Courage, MD   50 mg at 01/26/18 2355  . levothyroxine (SYNTHROID, LEVOTHROID) tablet 175 mcg  175 mcg Oral QAC breakfast Emokpae, Courage, MD      . loratadine (CLARITIN) tablet 10 mg  10 mg Per Tube Daily Karie SodaGross, Steven, MD      . meropenem (MERREM) 1 g in sodium chloride 0.9 % 100 mL IVPB  1 g Intravenous Q8H Almond LintByerly, Faera, MD   Stopped at 01/27/18 0701  . methocarbamol (ROBAXIN) 1,000 mg in dextrose 5 % 50 mL IVPB  1,000 mg Intravenous Q6H PRN Karie SodaGross, Steven, MD      . metoCLOPramide (REGLAN) 10 MG/10ML solution 10 mg  10 mg Per Tube Trecia RogersQ6H Gross, Steven, MD   10 mg at 01/27/18 0600  . metoprolol tartrate (LOPRESSOR) 25 mg/10 mL oral suspension 12.5 mg  12.5 mg Per Tube BID Marcelo BaldySchertz, Adam Ross, MD   12.5 mg at 01/27/18 0110  . multivitamin liquid 15 mL  15 mL Per Tube Q1200 Marcelo BaldySchertz, Adam Ross, MD   Stopped at 01/26/18 1256  . pantoprazole sodium (PROTONIX) 40 mg/20 mL oral suspension 40 mg  40 mg Per Tube Q1200 Marcelo BaldySchertz, Adam Ross, MD   40 mg at 01/26/18 1336  . PARoxetine (PAXIL) tablet 20 mg  20 mg Oral Daily Marcelo BaldySchertz, Adam Ross, MD   Stopped at 01/26/18 1013  . polyethylene glycol (MIRALAX / GLYCOLAX) packet 17 g  17 g Oral BID Marcelo BaldySchertz, Adam Ross, MD   17 g at 01/26/18 2356  . polyvinyl alcohol (LIQUIFILM TEARS) 1.4 % ophthalmic solution 1 drop  1 drop Both Eyes QID Marcelo BaldySchertz, Adam Ross, MD   1 drop at 01/26/18 2355  . risperiDONE (RISPERDAL M-TABS) disintegrating tablet 2 mg  2 mg Sublingual BID Marcelo BaldySchertz, Adam Ross, MD   Stopped at 01/26/18 1014  . sennosides (SENOKOT) 8.8 MG/5ML syrup 15 mL  15 mL  Oral BID Marcelo BaldySchertz, Adam Ross, MD   15 mL at 01/26/18 2353  . traMADol (ULTRAM) tablet 50-100 mg  50-100 mg Oral Q6H PRN Karie SodaGross, Steven, MD      . traZODone (DESYREL) tablet 50 mg  50 mg Oral BID Marcelo BaldySchertz, Adam Ross, MD   50 mg at 01/26/18 2352  . vancomycin (VANCOCIN) 1,250 mg in sodium chloride 0.9 % 250 mL IVPB  1,250 mg Intravenous Q12H Shon HaleEmokpae, Courage, MD   Stopped at 01/27/18 0316     Discharge Medications: Please see discharge summary for a list of discharge medications.  Relevant Imaging Results:  Relevant Lab Results:   Additional Information ssn: [id number]  Coralyn HellingBernette Ondre Salvetti, LCSW

## 2018-01-27 NOTE — Clinical Social Work Note (Signed)
Clinical Social Work Assessment  Patient Details  Name: [name] MRN: [id number] Date of Birth: [birth year]  Date of referral:  01/27/18               Reason for consult:  Facility Placement                Permission sought to share information with:  Family Supports Permission granted to share information::  Yes, Verbal Permission Granted  Name::        Agency::  [facility]  Relationship::  Mother  Contact Information:     Housing/Transportation Living arrangements for the past 2 months:  [facility] Source of Information:  Parent Patient Interpreter Needed:  None Criminal Activity/Legal Involvement Pertinent to Current Situation/Hospitalization:  No - Comment as needed Significant Relationships:  Spouse, Parents Lives with:  Facility Resident Do you feel safe going back to the place where you live?  Yes Need for family participation in patient care:  Yes (TBI, Dependendent with mobility)  Care giving concerns:   No family concerns.   Social Worker assessment / plan:    Patient admitted for: Abdominal Wall Abscess.   CSW spoke with patient mother via phone about discharge plan. [facility] the [facility] will [facility] at discharge. The patient needs maximum assistance with all ADL's.  Patient attempts to ambulate at facility and falls. The facility encourages the patient to use wheelchair.   CSW confirmed facility will accept patient.   Plan: [facility]  Employment status:  Disabled (Comment on whether or not currently receiving Disability) Insurance information:  Medicare PT Recommendations:  Skilled Nursing Facility Information / Referral to community resources:  Skilled Nursing Facility  Patient/Family's Response to care: No family at bedside.   Patient/Family's Understanding of and Emotional Response to Diagnosis, Current Treatment, and Prognosis: Patient mother knowledgeable of patient  care.    Emotional Assessment Appearance:  Appears stated age Attitude/Demeanor/Rapport:    Affect (typically observed):  Unable to Assess Orientation:  Oriented to Self Alcohol / Substance use:  Not Applicable Psych involvement (Current and /or in the community):  No (Comment)  Discharge Needs  Concerns to be addressed:  Discharge Planning Concerns Readmission within the last 30 days:  No Current discharge risk:  None Barriers to Discharge:  No Barriers Identified   Clearance CootsNicole A Graysin Luczynski, LCSW 01/27/2018, 10:45 AM

## 2018-01-27 NOTE — Progress Notes (Signed)
1 Day Post-Op    CC: Abdominal wall infection  Subjective: Patient is a phasic but he knows what she was saying most of the time as far as I can tell.  We took the dressings down and repacked both sites.  The upper incision site of the mesh removal is probably about 4 cm deep.  And it goes caudally.  We also took the dressing down on the gastrostomy tube he has some tube feeding around the insertion site.  It is a little red working to put some zinc oxide around it to protect the skin there and then redress.  Still some mild erythema around sites from last night.  Objective: Vital signs in last 24 hours: Temp:  [97.5 F (36.4 C)-98.6 F (37 C)] 97.9 F (36.6 C) (01/31 1057) Pulse Rate:  [56-85] 77 (01/31 1057) Resp:  [10-20] 18 (01/31 1057) BP: (100-115)/(59-78) 115/68 (01/31 1057) SpO2:  [94 %-100 %] 95 % (01/31 1057) Weight:  [113.1 kg (249 lb 6.4 oz)-115 kg (253 lb 8 oz)] 115 kg (253 lb 8 oz) (01/31 0428) Last BM Date: 01/26/18 N.p.o. IV 3570 Gastrostomy 500 Urine 375 Afebrile vital signs are stable. No labs this a.m. No new films Intake/Output from previous day: 01/30 0701 - 01/31 0700 In: 4775 [I.V.:3570.8; NG/GT:364.2; IV Piggyback:600] Out: 400 [Urine:375; Blood:25] Intake/Output this shift: Total I/O In: 200 [IV Piggyback:200] Out: 200 [Urine:200]  General appearance: alert, cooperative and no distress GI: Soft, tube feeding is in place and has been resumed.  2 open sites 1 large enough for a 4 x 4.  The second site superior to that is about 4 cm deep is still has some purulent material on the packing.  Some ongoing mild erythema around the open site. Skin: Skin color, texture, turgor normal. No rashes or lesions or Open sites have some ongoing erythema the remainder of the skin anteriorly looks good.  Lab Results:  Recent Labs    01/25/18 1755 01/26/18 0558  WBC 8.3 8.6  HGB 12.6* 11.3*  HCT 39.0 35.4*  PLT 174 162    BMET Recent Labs    01/25/18 1755  01/26/18 0558  NA 142 142  K 4.6 3.7  CL 107 109  CO2 26 27  GLUCOSE 92 80  BUN 15 13  CREATININE 1.05 0.87  CALCIUM 8.9 8.6*   PT/INR Recent Labs    01/25/18 2328  LABPROT 14.5  INR 1.14    Recent Labs  Lab 01/25/18 1755  AST 15  ALT 9*  ALKPHOS 71  BILITOT 0.1*  PROT 7.3  ALBUMIN 3.0*     Lipase     Component Value Date/Time   LIPASE 26 01/02/2013 2230     Medications: . divalproex  500 mg Oral TID  . docusate sodium  100 mg Oral BID  . enoxaparin (LOVENOX) injection  40 mg Subcutaneous QHS  . hydrocortisone sod succinate (SOLU-CORTEF) inj  50 mg Intravenous Q8H  . levothyroxine  175 mcg Oral QAC breakfast  . loratadine  10 mg Per Tube Daily  . metoCLOPramide  10 mg Per Tube Q6H  . metoprolol tartrate  12.5 mg Per Tube BID  . multivitamin  15 mL Per Tube Q1200  . pantoprazole sodium  40 mg Per Tube Q1200  . PARoxetine  20 mg Oral Daily  . polyethylene glycol  17 g Oral BID  . polyvinyl alcohol  1 drop Both Eyes QID  . risperiDONE  2 mg Sublingual BID  . sennosides  15 mL Oral BID  . traZODone  50 mg Oral BID   . dextrose 5 % and 0.45% NaCl 50 mL/hr (01/27/18 0237)  . feeding supplement (JEVITY 1.2 CAL) 1,000 mL (01/26/18 2357)  . meropenem (MERREM) IV Stopped (01/27/18 0701)  . methocarbamol (ROBAXIN)  IV    . vancomycin Stopped (01/27/18 0316)   Anti-infectives (From admission, onward)   Start     Dose/Rate Route Frequency Ordered Stop   01/26/18 1200  vancomycin (VANCOCIN) 1,250 mg in sodium chloride 0.9 % 250 mL IVPB     1,250 mg 166.7 mL/hr over 90 Minutes Intravenous Every 12 hours 01/26/18 0728     01/26/18 1000  vancomycin (VANCOCIN) 1,750 mg in sodium chloride 0.9 % 500 mL IVPB  Status:  Discontinued     1,750 mg 250 mL/hr over 120 Minutes Intravenous Every 12 hours 01/26/18 0650 01/26/18 0728   01/25/18 2245  meropenem (MERREM) 1 g in sodium chloride 0.9 % 100 mL IVPB     1 g 200 mL/hr over 30 Minutes Intravenous Every 8 hours 01/25/18  2242     01/25/18 2100  vancomycin (VANCOCIN) 2,000 mg in sodium chloride 0.9 % 500 mL IVPB     2,000 mg 250 mL/hr over 120 Minutes Intravenous  Once 01/25/18 2043 01/26/18 0127      Assessment/Plan Abdominal wall abscess Irrigation and debridement abdominal wall abscess with removal of infected mesh, 01/26/18 Dr. Remer MachoStepven Gross  History of anoxic brain injury-cardiac arrest PEG dependent History of seizure disorder Paroxysmal atrial fibrillation -not on chronic anticoagulation Adrenal insufficiency  FEN: IV fluids/tube feeding ID: Vancomycin/meropenem 1/29 =>> day 3 DVT: Lovenox Foley: External catheter Follow-up: Dr. Karie SodaSteven Gross    Plan: Wet-to-dry dressings twice daily to the open sites.  Continue antibiotics.  Nothing on the culture so far.  LOS: 2 days    Calani Gick 01/27/2018 216 218 [phone]

## 2018-01-28 ENCOUNTER — Inpatient Hospital Stay (HOSPITAL_COMMUNITY): Payer: Medicare Other

## 2018-01-28 ENCOUNTER — Encounter (HOSPITAL_COMMUNITY): Payer: Self-pay | Admitting: Interventional Radiology

## 2018-01-28 HISTORY — PX: IR REPLACE G-TUBE SIMPLE WO FLUORO: IMG2323

## 2018-01-28 LAB — BASIC METABOLIC PANEL
Anion gap: 6 (ref 5–15)
BUN: 16 mg/dL (ref 6–20)
CHLORIDE: 109 mmol/L (ref 101–111)
CO2: 26 mmol/L (ref 22–32)
CREATININE: 0.67 mg/dL (ref 0.61–1.24)
Calcium: 8.4 mg/dL — ABNORMAL LOW (ref 8.9–10.3)
GFR calc Af Amer: >60 mL/min (ref >60)
GFR calc non Af Amer: >60 mL/min (ref >60)
Glucose, Bld: 109 mg/dL — ABNORMAL HIGH (ref 65–99)
POTASSIUM: 3.3 mmol/L — AB (ref 3.5–5.1)
SODIUM: 141 mmol/L (ref 135–145)

## 2018-01-28 LAB — CBC
HCT: 34.9 % — ABNORMAL LOW (ref 39.0–52.0)
Hemoglobin: 11.3 g/dL — ABNORMAL LOW (ref 13.0–17.0)
MCH: 26.5 pg (ref 26.0–34.0)
MCHC: 32.4 g/dL (ref 30.0–36.0)
MCV: 81.7 fL (ref 78.0–100.0)
PLATELETS: 172 10*3/uL (ref 150–400)
RBC: 4.27 MIL/uL (ref 4.22–5.81)
RDW: 14.6 % (ref 11.5–15.5)
WBC: 8.4 10*3/uL (ref 4.0–10.5)

## 2018-01-28 LAB — GLUCOSE, CAPILLARY
GLUCOSE-CAPILLARY: 86 mg/dL (ref 65–99)
GLUCOSE-CAPILLARY: 90 mg/dL (ref 65–99)
Glucose-Capillary: 85 mg/dL (ref 65–99)
Glucose-Capillary: 93 mg/dL (ref 65–99)

## 2018-01-28 MED ORDER — POTASSIUM CHLORIDE 20 MEQ PO PACK: 40.0000 meq | PACK | Freq: Once | ORAL | Status: DC

## 2018-01-28 MED ORDER — PREDNISONE 20 MG PO TABS
40.0000 mg | ORAL_TABLET | Freq: Every day | ORAL | Status: DC
  Administered 2018-01-29 – 2018-01-30 (×2): 40 mg via ORAL
  Filled 2018-01-28 (×2): qty 2

## 2018-01-28 MED ORDER — SODIUM CHLORIDE 0.9% FLUSH
10.0000 mL | INTRAVENOUS | Status: DC | PRN
  Administered 2018-01-29: 10 mL
  Filled 2018-01-28: qty 40

## 2018-01-28 MED ORDER — POTASSIUM CHLORIDE 20 MEQ/15ML (10%) PO SOLN
40.0000 meq | Freq: Once | ORAL | Status: AC
  Administered 2018-01-28: 40 meq
  Filled 2018-01-28: qty 30

## 2018-01-28 NOTE — Progress Notes (Signed)
Pharmacy - Brief Note  BCID = methicillin resistant Coag neg staph detected 1/29 BCx:   2/2 pediatric bottles with coag neg staph (noted to be oxacillin susc)  Due to above discrepancy: - micro lab called and asked to run susc on 2nd set of 1/29 BCx.  Lab tech noted that S. hominus is in both bottles but appeared to have a second isolate present.  This may be reason for discrepancy in BCID and cx susc result.  Micro to work up 2nd isolate as well.    Juliette Alcideustin Necole Minassian, PharmD, BCPS.   Pager: [phone] 01/28/2018 3:12 PM

## 2018-01-28 NOTE — Care Management Important Message (Signed)
Important Message  Patient Details  Name: [name] MRN: [id number] Date of Birth: 1966/06/14   Medicare Important Message Given:  Yes    Caren MacadamFuller, Abegail Kloeppel 01/28/2018, 11:04 AMImportant Message  Patient Details  Name: [name] MRN: [id number] Date of Birth: 1966/06/14   Medicare Important Message Given:  Yes    Caren MacadamFuller, Raedyn Klinck 01/28/2018, 11:04 AM

## 2018-01-28 NOTE — Progress Notes (Signed)
2 Days Post-Op    CC: Abdominal wall infection.  Subjective: Patient was found on the floor last evening his PEG tube came out.  The nursing staff thought he was quadriplegic. His open sites still have some erythema.  The packing was removed and he was redressed.  Significantly less drainage than noted yesterday.  It seems to be cleaning up nicely.  Objective: Vital signs in last 24 hours: Temp:  [97.6 F (36.4 C)-98.5 F (36.9 C)] 98 F (36.7 C) (02/01 0652) Pulse Rate:  [70-122] 122 (02/01 0652) Resp:  [18-24] 24 (02/01 0652) BP: (92-121)/(57-90) 114/90 (02/01 0652) SpO2:  [93 %-98 %] 95 % (02/01 0652) Weight:  [115.8 kg (255 lb 6.4 oz)] 115.8 kg (255 lb 6.4 oz) (02/01 0438) Last BM Date: 01/26/18 Wound culture Gram stain shows gram-positive cocci but no growth Specimen Description ABSCESS ABDOMEN   Special Requests NONE   Gram Stain RARE WBC PRESENT, PREDOMINANTLY PMN  MODERATE GRAM POSITIVE COCCI IN PAIRS IN CLUSTERS     Culture TOO YOUNG TO READ     N.p.o. 1200 Iv 263 per NG recorded. Urine 650 Stool x2 Afebrile vital signs are stable K+ is low at 3.3. WBC is 8.4 H/H 11.3/34.9. Intake/Output from previous day: 01/31 0701 - 02/01 0700 In: 1460.8 [I.V.:797.7; NG/GT:263.1; IV Piggyback:400] Out: 650 [Urine:650] Intake/Output this shift: No intake/output data recorded.  General appearance: alert, cooperative and no distress Resp: clear to auscultation bilaterally GI: Soft nontender, Foley has been replaced in the G-tube site.  Open wound still shows some erythema around the opening.  Drainage from both sites is markedly reduced and the dressing is fairly clean coming back out.  Lab Results:  Recent Labs    01/26/18 0558 01/28/18 0457  WBC 8.6 8.4  HGB 11.3* 11.3*  HCT 35.4* 34.9*  PLT 162 172    BMET Recent Labs    01/26/18 0558 01/28/18 0457  NA 142 141  K 3.7 3.3*  CL 109 109  CO2 27 26  GLUCOSE 80 109*  BUN 13 16  CREATININE 0.87 0.67   CALCIUM 8.6* 8.4*   PT/INR Recent Labs    01/25/18 2328  LABPROT 14.5  INR 1.14    Recent Labs  Lab 01/25/18 1755  AST 15  ALT 9*  ALKPHOS 71  BILITOT 0.1*  PROT 7.3  ALBUMIN 3.0*     Lipase     Component Value Date/Time   LIPASE 26 01/02/2013 2230     Medications: . divalproex  500 mg Oral TID  . docusate sodium  100 mg Oral BID  . enoxaparin (LOVENOX) injection  40 mg Subcutaneous QHS  . hydrocortisone sod succinate (SOLU-CORTEF) inj  50 mg Intravenous Q12H  . levothyroxine  175 mcg Oral QAC breakfast  . loratadine  10 mg Per Tube Daily  . metoCLOPramide  10 mg Per Tube Q6H  . metoprolol tartrate  12.5 mg Per Tube BID  . multivitamin  15 mL Per Tube Q1200  . pantoprazole sodium  40 mg Per Tube Q1200  . PARoxetine  20 mg Oral Daily  . polyethylene glycol  17 g Oral BID  . polyvinyl alcohol  1 drop Both Eyes QID  . risperiDONE  2 mg Sublingual BID  . sennosides  15 mL Oral BID  . traZODone  50 mg Oral BID   Anti-infectives (From admission, onward)   Start     Dose/Rate Route Frequency Ordered Stop   01/26/18 1200  vancomycin (VANCOCIN) 1,250 mg in  sodium chloride 0.9 % 250 mL IVPB     1,250 mg 166.7 mL/hr over 90 Minutes Intravenous Every 12 hours 01/26/18 0728     01/26/18 1000  vancomycin (VANCOCIN) 1,750 mg in sodium chloride 0.9 % 500 mL IVPB  Status:  Discontinued     1,750 mg 250 mL/hr over 120 Minutes Intravenous Every 12 hours 01/26/18 0650 01/26/18 0728   01/25/18 2245  meropenem (MERREM) 1 g in sodium chloride 0.9 % 100 mL IVPB     1 g 200 mL/hr over 30 Minutes Intravenous Every 8 hours 01/25/18 2242     01/25/18 2100  vancomycin (VANCOCIN) 2,000 mg in sodium chloride 0.9 % 500 mL IVPB     2,000 mg 250 mL/hr over 120 Minutes Intravenous  Once 01/25/18 2043 01/26/18 0127      Assessment/Plan Abdominal wall abscess Irrigation and debridement abdominal wall abscess with removal of infected mesh, 01/26/18 Dr. Remer Macho POD 2  History of  anoxic brain injury-cardiac arrest PEG dependent History of seizure disorder Paroxysmal atrial fibrillation -not on chronic anticoagulation Adrenal insufficiency  FEN: IV fluids/tube feeding ID: Vancomycin/meropenem 1/29 =>> day 4 DVT: Lovenox Foley: External catheter Follow-up: Dr. Karie Soda  Plan: Continue wet-to-dry dressing changes and antibiotics.  Once we get a culture we could switch him over to oral antibiotics.  Follow-up information is in the AVS.        LOS: 3 days    Xinyi Batton 01/28/2018 [phone]

## 2018-01-28 NOTE — Progress Notes (Signed)
Pt's IV continuously beeps "occluded on patient side" Attempted to flush line X 2, hard to flush, and pump continues to beep occluded. IVF stopped at this time, order sent to IV Team for evaluation.

## 2018-01-28 NOTE — Progress Notes (Signed)
Notified MD that Patient had fallen out of bed.  He's unable to tell us how he did it or why he attempted.    Does not appear to have hit his head but c/o pain in right arm when is swollen he has an area on the right lower flank that is quite swollen.  Orders given for CT and scan.  Will notify patient mother.

## 2018-01-28 NOTE — Progress Notes (Signed)
Patient lost Left Peripheral IV d/t infiltration.  Two nurses assessed and IV team placed a new site.  Pt has generalized edema and has it's difficult to see the edematous site.  Also patient is incontinent as times and therefore difficult to see actual amount of urine output.  No rales or but diminished breath sounds.  Will monitor and notify with any other issues.

## 2018-01-28 NOTE — Progress Notes (Signed)
Patient Demographics:    [name], is a 52 y.o. male, DOB - Jun 26, 1966, ONG:[id number]  Admit date - 01/25/2018   Admitting Physician Delano Metz, MD  Outpatient Primary MD for the patient is Georgann Housekeeper, MD  LOS - 3  Chief Complaint  Patient presents with  . Abscess        Subjective:    [name] today has no fevers, no emesis,  No chest pain,   No cough  Assessment  & Plan :    Principal Problem:   Abdominal wall abscess Active Problems:   Anoxic brain injury (HCC)   History of atrial fibrillation   Cardiomyopathy, ischemic   PEG Gastrostomy tube in place [facility])   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 01/26/2018   Bedridden   Quadriplegia [facility])  Brief Summary :- 52 y.o. 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 fib not on systemic anticoagulation and adrenal insufficiency who was admitted on 01/25/18 with significant worsening abdominal wall abscess.  Patient is afebrile without leukocytosis however CT abdomen and pelvis and clinical exam reveals a rather large anterior abdominal wall abscess without involvement of the PEG tube site or involvement of PEG tube tract.  Patient failed oral doxycycline prior to admission Admission blood cultures from 01/25/2018 with methicillin-resistant coagulase negative staph, wound/OR/ Sterile cultures from 01/26/2018 with staph aureus, repeat blood cultures from 01/27/2018 Neg so far  Plan:- 1)Anterior abd wall Abscess-status post I&D on 01/26/2018, no fevers, no leukocytosis continue wound packing per general surgery, Admission blood cultures from 01/25/2018 with methicillin-resistant coagulase negative staph, wound/OR/ Sterile cultures from 01/26/2018 with staph aureus (sensitivity pending), repeat blood cultures  from 01/27/2018 Neg so far, D/w Dr. Cliffton Asters from infectious disease, c/n  Vanco (started 01/25/18), STOP Meropenem  On 01/28/18  2)History of hypothyroidism and adrenal insufficiency- a.m. serum cortisol was 5.3 (low) on 01/26/18, TSH is 0.3, getting stress dose Solu-Cortef, transition to prednisone taper,  reduced levothyroxine to 175 mcg from 200 mcg.   3)Social/Ethics-patient with prior history of anoxic brain injury with some cognitive deficits,  patient is a full code, patients mother Talbert Forest cell is [phone] is the  decision maker, pt also has a spouse  4)NeuroPsych- patient with prior history of anoxic brain injury with some cognitive deficits, no significant behavioral disturbance at this time, continue Depakote, and Risperdal, Paxil and trazodone  5)SZ Disorder- stopped tramadol due to increased risk of seizures, continue Depakote  6)H/o CAD/Prior MI subsequent anoxic brain injury-continue metoprolol 5 mg twice daily, ?????? by patient is not on statin or aspirin  7)FEN-  PEG tube fell out on 01/28/2018, replaced by interventional radiology,  dietitian recommends  PEG tube feedings with Vital 1.5 @ 55 ml/hr via PEG-providing 1980 kcal, 89g protein and  1008 ml H2O for now, Once diet is advanced, can transition back to bolus feed with Jevity 1.2 regimen as pt primarily uses tube if consuming <50% of meals  8)S/p Fall -patient fell out of bed on 01/28/2018, landing on the right side, CT head and pelvic x-rays without acute findings, PEG tube fell out during his fall, PEG tube was replaced by interventional radiology  Disposition Plan  : TBD  Consults  :  Gen Surgery/dietitian/infectious disease/interventional radiology  DVT Prophylaxis  :  Lovenox   Lab Results  Component Value Date   PLT 172 01/28/2018    Inpatient Medications  Scheduled Meds: . divalproex  500 mg Oral TID  . docusate sodium  100 mg Oral BID  . enoxaparin (LOVENOX) injection  40 mg Subcutaneous QHS  .  hydrocortisone sod succinate (SOLU-CORTEF) inj  50 mg Intravenous Q12H  . levothyroxine  175 mcg Oral QAC breakfast  . loratadine  10 mg Per Tube Daily  . metoCLOPramide  10 mg Per Tube Q6H  . metoprolol tartrate  12.5 mg Per Tube BID  . multivitamin  15 mL Per Tube Q1200  . pantoprazole sodium  40 mg Per Tube Q1200  . PARoxetine  20 mg Oral Daily  . polyethylene glycol  17 g Oral BID  . polyvinyl alcohol  1 drop Both Eyes QID  . risperiDONE  2 mg Sublingual BID  . sennosides  15 mL Oral BID  . traZODone  50 mg Oral BID   Continuous Infusions: . dextrose 5 % and 0.45% NaCl 20 mL/hr at 01/27/18 1537  . feeding supplement (VITAL 1.5 CAL) 1,000 mL (01/27/18 1813)  . meropenem (MERREM) IV 1 g (01/28/18 0504)  . methocarbamol (ROBAXIN)  IV    . vancomycin Stopped (01/28/18 0100)   PRN Meds:.    Anti-infectives (From admission, onward)   Start     Dose/Rate Route Frequency Ordered Stop   01/26/18 1200  vancomycin (VANCOCIN) 1,250 mg in sodium chloride 0.9 % 250 mL IVPB     1,250 mg 166.7 mL/hr over 90 Minutes Intravenous Every 12 hours 01/26/18 0728     01/26/18 1000  vancomycin (VANCOCIN) 1,750 mg in sodium chloride 0.9 % 500 mL IVPB  Status:  Discontinued     1,750 mg 250 mL/hr over 120 Minutes Intravenous Every 12 hours 01/26/18 0650 01/26/18 0728   01/25/18 2245  meropenem (MERREM) 1 g in sodium chloride 0.9 % 100 mL IVPB     1 g 200 mL/hr over 30 Minutes Intravenous Every 8 hours 01/25/18 2242     01/25/18 2100  vancomycin (VANCOCIN) 2,000 mg in sodium chloride 0.9 % 500 mL IVPB     2,000 mg 250 mL/hr over 120 Minutes Intravenous  Once 01/25/18 2043 01/26/18 0127        Objective:   Vitals:   01/28/18 0438 01/28/18 0543 01/28/18 0652 01/28/18 1427  BP:  (!) 92/57 114/90 104/75  Pulse:  70 (!) 122 88  Resp:  18 (!) 24 20  Temp:  97.6 F (36.4 C) 98 F (36.7 C) 98 F (36.7 C)  TempSrc:  Oral Oral Oral  SpO2:  96% 95% 98%  Weight: 115.8 kg (255 lb 6.4 oz)        Wt Readings from Last 3 Encounters:  01/28/18 115.8 kg (255 lb 6.4 oz)  10/07/17 119.9 kg (264 lb 6.4 oz)  06/07/17 124.7 kg (275 lb)     Intake/Output Summary (Last 24 hours) at 01/28/2018 1456 Last data filed at 01/28/2018 1428 Gross per 24 hour  Intake 1260.75 ml  Output 550 ml  Net 710.75 ml     Physical Exam  Gen:- Awake Alert, able to answer questions, follows commands HEENT:- [place].AT, No sclera icterus Neck-Supple Neck,No JVD,.  Lungs-  CTAB  CV- S1, S2 normal Abd-  +ve B.Sounds, Abd Soft, scars from prior abdominal surgeries noted, PEG  tube site intact (replaced 01/28/18), postop wound areas with packing, less swelling, less erythema and less drainage Neuro-generalized weakness and neuromuscular deficits at baseline, patient is able to move all 4 extremities, Psych-cognitive deficits consistent with anoxic brain injury with history of behavioral disturbance    Data Review:   Micro Results Recent Results (from the past 240 hour(s))  Culture, blood (routine x 2)     Status: Abnormal   Collection Time: 01/25/18  6:18 PM  Result Value Ref Range Status   Specimen Description BLOOD LEFT HAND  Final   Special Requests IN PEDIATRIC BOTTLE Blood Culture adequate volume  Final   Culture  Setup Time   Final    GRAM POSITIVE COCCI IN PEDIATRIC BOTTLE CRITICAL VALUE NOTED.  VALUE IS CONSISTENT WITH PREVIOUSLY REPORTED AND CALLED VALUE.    Culture (A)  Final    STAPHYLOCOCCUS SPECIES (COAGULASE NEGATIVE) SUSCEPTIBILITIES PERFORMED ON PREVIOUS CULTURE WITHIN THE LAST 5 DAYS. Performed at [facility] [place] [facility], [address] [place], [place] [address]    Report Status 01/28/2018 FINAL  Final  Culture, blood (routine x 2)     Status: Abnormal (Preliminary result)   Collection Time: 01/25/18  6:18 PM  Result Value Ref Range Status   Specimen Description BLOOD RIGHT HAND  Final   Special Requests IN PEDIATRIC BOTTLE Blood Culture adequate volume  Final   Culture  Setup Time    Final    GRAM POSITIVE COCCI IN PEDIATRIC BOTTLE CRITICAL RESULT CALLED TO, READ BACK BY AND VERIFIED WITH: M. Lilliston Pharm.D. 17:10 01/26/18 ([email or web address])    Culture (A)  Final    STAPHYLOCOCCUS SPECIES (COAGULASE NEGATIVE) CULTURE REINCUBATED FOR BETTER GROWTH Performed at [facility], [address] [place], [place] [address]    Report Status PENDING  Incomplete   Organism ID, Bacteria STAPHYLOCOCCUS SPECIES (COAGULASE NEGATIVE)  Final      Susceptibility   Staphylococcus species (coagulase negative) - MIC*    CIPROFLOXACIN <=0.5 SENSITIVE Sensitive     ERYTHROMYCIN 0.5 SENSITIVE Sensitive     GENTAMICIN <=0.5 SENSITIVE Sensitive     OXACILLIN <=0.25 SENSITIVE Sensitive     TETRACYCLINE >=16 RESISTANT Resistant     VANCOMYCIN 1 SENSITIVE Sensitive     TRIMETH/SULFA <=10 SENSITIVE Sensitive     CLINDAMYCIN <=0.25 SENSITIVE Sensitive     RIFAMPIN <=0.5 SENSITIVE Sensitive     Inducible Clindamycin NEGATIVE Sensitive     * STAPHYLOCOCCUS SPECIES (COAGULASE NEGATIVE)  Blood Culture ID Panel (Reflexed)     Status: Abnormal   Collection Time: 01/25/18  6:18 PM  Result Value Ref Range Status   Enterococcus species NOT DETECTED NOT DETECTED Final   Listeria monocytogenes NOT DETECTED NOT DETECTED Final   Staphylococcus species DETECTED (A) NOT DETECTED Final    Comment: Methicillin (oxacillin) resistant coagulase negative staphylococcus. Possible blood culture contaminant (unless isolated from more than one blood culture draw or clinical case suggests pathogenicity). No antibiotic treatment is indicated for blood  culture contaminants. CRITICAL RESULT CALLED TO, READ BACK BY AND VERIFIED WITH: M. Lilliston Pharm.D. 17:10 01/26/18 ([email or web address])    Staphylococcus aureus NOT DETECTED NOT DETECTED Final   Methicillin resistance DETECTED (A) NOT DETECTED Final    Comment: CRITICAL RESULT CALLED TO, READ BACK BY AND VERIFIED WITH: M. Lilliston Pharm.D. 17:10 01/26/18 ([email or web address])     Streptococcus species NOT DETECTED NOT DETECTED Final   Streptococcus agalactiae NOT DETECTED NOT DETECTED Final   Streptococcus pneumoniae NOT DETECTED NOT DETECTED Final  Streptococcus pyogenes NOT DETECTED NOT DETECTED Final   Acinetobacter baumannii NOT DETECTED NOT DETECTED Final   Enterobacteriaceae species NOT DETECTED NOT DETECTED Final   Enterobacter cloacae complex NOT DETECTED NOT DETECTED Final   Escherichia coli NOT DETECTED NOT DETECTED Final   Klebsiella oxytoca NOT DETECTED NOT DETECTED Final   Klebsiella pneumoniae NOT DETECTED NOT DETECTED Final   Proteus species NOT DETECTED NOT DETECTED Final   Serratia marcescens NOT DETECTED NOT DETECTED Final   Haemophilus influenzae NOT DETECTED NOT DETECTED Final   Neisseria meningitidis NOT DETECTED NOT DETECTED Final   Pseudomonas aeruginosa NOT DETECTED NOT DETECTED Final   Candida albicans NOT DETECTED NOT DETECTED Final   Candida glabrata NOT DETECTED NOT DETECTED Final   Candida krusei NOT DETECTED NOT DETECTED Final   Candida parapsilosis NOT DETECTED NOT DETECTED Final   Candida tropicalis NOT DETECTED NOT DETECTED Final  Surgical PCR screen     Status: None   Collection Time: 01/26/18  1:20 PM  Result Value Ref Range Status   MRSA, PCR NEGATIVE NEGATIVE Final   Staphylococcus aureus NEGATIVE NEGATIVE Final    Comment: (NOTE) The Xpert SA Assay (FDA approved for NASAL specimens in patients 89 years of age and older), is one component of a comprehensive surveillance program. It is not intended to diagnose infection nor to guide or monitor treatment.   Aerobic/Anaerobic Culture (surgical/deep wound)     Status: None (Preliminary result)   Collection Time: 01/26/18  6:23 PM  Result Value Ref Range Status   Specimen Description   Final    ABSCESS ABDOMEN Performed at [facility], [address] [place], [place] [address]    Special Requests   Final    NONE Performed at [facility], [address], [place], [place] [address]    Gram Stain   Final    RARE WBC PRESENT, PREDOMINANTLY PMN MODERATE GRAM POSITIVE COCCI IN PAIRS IN CLUSTERS Performed at [facility], [address], [place], [place] [address]    Culture   Final    ABUNDANT STAPHYLOCOCCUS AUREUS NO ANAEROBES ISOLATED; CULTURE IN PROGRESS FOR 5 DAYS    Report Status PENDING  Incomplete  Culture, blood (Routine X 2) w Reflex to ID Panel     Status: None (Preliminary result)   Collection Time: 01/27/18  5:51 PM  Result Value Ref Range Status   Specimen Description   Final    BLOOD LEFT ANTECUBITAL Performed at [facility], [address], [place], [place] [address]    Special Requests   Final    IN PEDIATRIC BOTTLE Blood Culture adequate volume Performed at [facility], [address] [place], [place] [address]    Culture   Final    NO GROWTH < 24 HOURS Performed at [facility], [address], [place], [place] [address]    Report Status PENDING  Incomplete  Culture, blood (Routine X 2) w Reflex to ID Panel     Status: None (Preliminary result)   Collection Time: 01/27/18  7:08 PM  Result Value Ref Range Status   Specimen Description   Final    BLOOD RIGHT HAND Performed at [facility], [address], [place], [place] [address]    Special Requests   Final    IN PEDIATRIC BOTTLE Blood Culture adequate volume Performed at [facility], [address], [place], [place] [address]    Culture  Final    NO GROWTH < 24 HOURS Performed at [facility], [address], [place], [place] [address]    Report Status PENDING  Incomplete    Radiology Reports Ct Head Wo Contrast  Result Date: 01/28/2018 CLINICAL DATA:  Fall out of bed, history of anoxic brain injury status post cardiac arrest EXAM: CT HEAD WITHOUT CONTRAST TECHNIQUE: Contiguous axial images were obtained from the base of the skull  through the vertex without intravenous contrast. COMPARISON:  10/02/2017 FINDINGS: Motion degraded images. Brain: No evidence of acute infarction, hemorrhage, hydrocephalus, extra-axial collection or mass lesion/mass effect. Mild cortical and central atrophy. Subcortical white matter and periventricular small vessel ischemic changes. Vascular: No hyperdense vessel or unexpected calcification. Skull: Normal. Negative for fracture or focal lesion. Sinuses/Orbits: The visualized paranasal sinuses are essentially clear. The mastoid air cells are unopacified. Other: None. IMPRESSION: No evidence of acute intracranial abnormality. Atrophy with small vessel ischemic changes. Electronically Signed   By: Charline Bills M.D.   On: 01/28/2018 08:35   Ct Abdomen Pelvis W Contrast  Result Date: 01/25/2018 CLINICAL DATA:  Abscess near G-tube insertion site. Redness/swelling. History of right nephrectomy, peg tube placement, AKI EXAM: CT ABDOMEN AND PELVIS WITH CONTRAST TECHNIQUE: Multidetector CT imaging of the abdomen and pelvis was performed using the standard protocol following bolus administration of intravenous contrast. CONTRAST:  ISOVUE-300 IOPAMIDOL (ISOVUE-300) INJECTION 61% COMPARISON:  CT abdomen dated 10/04/2017. FINDINGS: Lower chest: No acute abnormality. Hepatobiliary: No focal liver abnormality is seen. Status post cholecystectomy. No biliary dilatation. Pancreas: Unremarkable. No pancreatic ductal dilatation or surrounding inflammatory changes. Spleen: Normal in size without focal abnormality. Adrenals/Urinary Tract: Right kidney is surgically absent. Adrenal glands appear normal. Stable hypodense mass within the upper pole of the left kidney, again measuring 2.3 cm, described as benign on earlier MRI abdomen. No left-sided hydronephrosis or perinephric fluid. No ureteral or bladder calculi. Stomach/Bowel: No dilated large or small bowel loops. No bowel wall thickening or evidence of bowel wall  inflammation seen. Peg tube appears appropriately positioned in the stomach. Vascular/Lymphatic: Mild aortic atherosclerosis. No enlarged lymph nodes seen. Reproductive: Prostate is unremarkable. Other: Lobular abscess collection within the subcutaneous soft tissues of the anterior abdominal wall, supraumbilical, measuring 8.7 x 6 x 6.1 cm (transverse by AP by craniocaudal dimensions). The abscess collection involves the underlying anterior abdominal wall rectus musculature but does not extend into the intraperitoneal abdomen. No intraperitoneal free fluid or abscess collection. No free intraperitoneal air. 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 No acute or suspicious osseous finding. IMPRESSION: 1. Anterior abdominal wall abscess collection, supraumbilical near the midline, measuring 8.7 x 6 x 6.1 cm, centered within the subcutaneous soft tissues but extending into the underlying anterior abdominal wall rectus musculature. No intraperitoneal extension. 2. The abscess collection within the anterior abdominal wall is located approximately 3 cm to the right of the PEG tube insertion site. The abscess does not appear to involve the PEG tube insertion site. 3. Peg tube appears adequately positioned in the stomach. 4. Right kidney is surgically absent. 5. Aortic atherosclerosis. 6. 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 Electronically Signed   By: Bary Richard M.D.   On: 01/25/2018 20:18   Dg Pelvis Portable  Result Date: 01/28/2018 CLINICAL DATA:  Fall.  Bilateral hip pain. EXAM: PORTABLE PELVIS 1-2 VIEWS COMPARISON:  None. FINDINGS: There  is no evidence of pelvic fracture or diastasis. No pelvic bone lesions are seen. IMPRESSION: Negative. Electronically Signed   By: Kennith Center M.D.   On: 01/28/2018 10:22     CBC Recent Labs   Lab 01/25/18 1755 01/26/18 0558 01/28/18 0457  WBC 8.3 8.6 8.4  HGB 12.6* 11.3* 11.3*  HCT 39.0 35.4* 34.9*  PLT 174 162 172  MCV 83.7 82.7 81.7  MCH 27.0 26.4 26.5  MCHC 32.3 31.9 32.4  RDW 14.7 15.1 14.6  LYMPHSABS 1.9  --   --   MONOABS 1.0  --   --   EOSABS 0.1  --   --   BASOSABS 0.0  --   --     Chemistries  Recent Labs  Lab 01/25/18 1755 01/25/18 2328 01/26/18 0558 01/28/18 0457  NA 142  --  142 141  K 4.6  --  3.7 3.3*  CL 107  --  109 109  CO2 26  --  27 26  GLUCOSE 92  --  80 109*  BUN 15  --  13 16  CREATININE 1.05  --  0.87 0.67  CALCIUM 8.9  --  8.6* 8.4*  MG  --  1.6*  --   --   AST 15  --   --   --   ALT 9*  --   --   --   ALKPHOS 71  --   --   --   BILITOT 0.1*  --   --   --    ------------------------------------------------------------------------------------------------------------------ No results for input(s): CHOL, HDL, LDLCALC, TRIG, CHOLHDL, LDLDIRECT in the last 72 hours.  Lab Results  Component Value Date   HGBA1C 4.6 11/24/2011   ------------------------------------------------------------------------------------------------------------------ Recent Labs    01/25/18 2328  TSH 0.338*   ------------------------------------------------------------------------------------------------------------------ No results for input(s): VITAMINB12, FOLATE, FERRITIN, TIBC, IRON, RETICCTPCT in the last 72 hours.  Coagulation profile Recent Labs  Lab 01/25/18 2328  INR 1.14    No results for input(s): DDIMER in the last 72 hours.  Cardiac Enzymes No results for input(s): CKMB, TROPONINI, MYOGLOBIN in the last 168 hours.  Invalid input(s): CK ------------------------------------------------------------------------------------------------------------------ No results found for: BNP   Shon Hale M.D on 01/28/2018 at 2:56 PM  Between 7am to 7pm - Pager - [phone]  After 7pm go to [email or web address] - password [email or web address]  [facility] -  Office  [phone]   Voice Recognition Reubin Milan dictation system was used to create this note, attempts have been made to correct errors. Please contact the author with questions and/or clarifications.

## 2018-01-29 LAB — GLUCOSE, CAPILLARY
GLUCOSE-CAPILLARY: 116 mg/dL — AB (ref 65–99)
GLUCOSE-CAPILLARY: 93 mg/dL (ref 65–99)
Glucose-Capillary: 117 mg/dL — ABNORMAL HIGH (ref 65–99)
Glucose-Capillary: 97 mg/dL (ref 65–99)

## 2018-01-29 LAB — CBC
HCT: 35 % — ABNORMAL LOW (ref 39.0–52.0)
HEMOGLOBIN: 11.1 g/dL — AB (ref 13.0–17.0)
MCH: 26.6 pg (ref 26.0–34.0)
MCHC: 31.7 g/dL (ref 30.0–36.0)
MCV: 83.9 fL (ref 78.0–100.0)
Platelets: 189 10*3/uL (ref 150–400)
RBC: 4.17 MIL/uL — ABNORMAL LOW (ref 4.22–5.81)
RDW: 14.8 % (ref 11.5–15.5)
WBC: 6.6 10*3/uL (ref 4.0–10.5)

## 2018-01-29 LAB — BASIC METABOLIC PANEL
Anion gap: 5 (ref 5–15)
BUN: 14 mg/dL (ref 6–20)
CHLORIDE: 109 mmol/L (ref 101–111)
CO2: 29 mmol/L (ref 22–32)
Calcium: 8.5 mg/dL — ABNORMAL LOW (ref 8.9–10.3)
Creatinine, Ser: 0.68 mg/dL (ref 0.61–1.24)
GFR calc Af Amer: >60 mL/min (ref >60)
GFR calc non Af Amer: >60 mL/min (ref >60)
GLUCOSE: 91 mg/dL (ref 65–99)
POTASSIUM: 3.7 mmol/L (ref 3.5–5.1)
Sodium: 143 mmol/L (ref 135–145)

## 2018-01-29 NOTE — Progress Notes (Addendum)
3 Days Post-Op    CC: Abdominal wall infection.  Subjective: Pt was very inappropriate with sitter yesterday and asked her to get in bed with him.    Objective: Vital signs in last 24 hours: Temp:  [98 F (36.7 C)-98.9 F (37.2 C)] 98.6 F (37 C) (02/02 0431) Pulse Rate:  [69-88] 69 (02/02 0431) Resp:  [18-20] 20 (02/02 0431) BP: (104-130)/(75-85) 109/75 (02/02 0431) SpO2:  [98 %-100 %] 98 % (02/02 0431) Weight:  [127.5 kg (281 lb 1.4 oz)] 127.5 kg (281 lb 1.4 oz) (02/02 0431) Last BM Date: 01/28/18 Wound culture Gram stain shows gram-positive cocci but no growth Specimen Description ABSCESS ABDOMEN   Special Requests NONE   Gram Stain RARE WBC PRESENT, PREDOMINANTLY PMN  MODERATE GRAM POSITIVE COCCI IN PAIRS IN CLUSTERS     Culture TOO YOUNG TO READ      Intake/Output from previous day: 02/01 0701 - 02/02 0700 In: 2010 [I.V.:410; NG/GT:1100; IV Piggyback:500] Out: 200 [Urine:200] Intake/Output this shift: No intake/output data recorded.  General appearance: alert, cooperative and no distress Resp: clear to auscultation bilaterally GI: Soft nontender, Foley has been replaced in the G-tube site.  Open wound still shows some erythema around the opening.  Drainage from both sites is markedly reduced and the dressing is fairly clean coming back out.  Lab Results:  Recent Labs    01/28/18 0457 01/29/18 0444  WBC 8.4 6.6  HGB 11.3* 11.1*  HCT 34.9* 35.0*  PLT 172 189    BMET Recent Labs    01/28/18 0457 01/29/18 0444  NA 141 143  K 3.3* 3.7  CL 109 109  CO2 26 29  GLUCOSE 109* 91  BUN 16 14  CREATININE 0.67 0.68  CALCIUM 8.4* 8.5*   PT/INR No results for input(s): LABPROT, INR in the last 72 hours.  Recent Labs  Lab 01/25/18 1755  AST 15  ALT 9*  ALKPHOS 71  BILITOT 0.1*  PROT 7.3  ALBUMIN 3.0*     Lipase     Component Value Date/Time   LIPASE 26 01/02/2013 2230     Medications: . divalproex  500 mg Oral TID  . enoxaparin (LOVENOX)  injection  40 mg Subcutaneous QHS  . levothyroxine  175 mcg Oral QAC breakfast  . loratadine  10 mg Per Tube Daily  . metoprolol tartrate  12.5 mg Per Tube BID  . multivitamin  15 mL Per Tube Q1200  . pantoprazole sodium  40 mg Per Tube Q1200  . PARoxetine  20 mg Oral Daily  . polyethylene glycol  17 g Oral BID  . polyvinyl alcohol  1 drop Both Eyes QID  . predniSONE  40 mg Oral Q breakfast  . risperiDONE  2 mg Sublingual BID  . sennosides  15 mL Oral BID  . traZODone  50 mg Oral BID   Anti-infectives (From admission, onward)   Start     Dose/Rate Route Frequency Ordered Stop   01/26/18 1200  vancomycin (VANCOCIN) 1,250 mg in sodium chloride 0.9 % 250 mL IVPB     1,250 mg 166.7 mL/hr over 90 Minutes Intravenous Every 12 hours 01/26/18 0728     01/26/18 1000  vancomycin (VANCOCIN) 1,750 mg in sodium chloride 0.9 % 500 mL IVPB  Status:  Discontinued     1,750 mg 250 mL/hr over 120 Minutes Intravenous Every 12 hours 01/26/18 0650 01/26/18 0728   01/25/18 2245  meropenem (MERREM) 1 g in sodium chloride 0.9 % 100 mL IVPB  Status:  Discontinued     1 g 200 mL/hr over 30 Minutes Intravenous Every 8 hours 01/25/18 2242 01/28/18 1510   01/25/18 2100  vancomycin (VANCOCIN) 2,000 mg in sodium chloride 0.9 % 500 mL IVPB     2,000 mg 250 mL/hr over 120 Minutes Intravenous  Once 01/25/18 2043 01/26/18 0127      Assessment/Plan Abdominal wall abscess Irrigation and debridement abdominal wall abscess with removal of infected mesh, 01/26/18 Dr. Karie Soda POD 2  History of anoxic brain injury-cardiac arrest PEG dependent History of seizure disorder Paroxysmal atrial fibrillation -not on chronic anticoagulation Adrenal insufficiency  FEN: IV fluids/tube feeding ID: Vancomycin/meropenem 1/29 =>> day 5 DVT: Lovenox Foley: External catheter Follow-up: Dr. Karie Soda  Plan: Continue wet-to-dry dressing changes and antibiotics. Staph aureus has grown, sensitive to most. Resistant to  tetracycline.   Follow-up information is in the AVS.        LOS: 4 days    Almond Lint, MD 01/29/2018

## 2018-01-29 NOTE — Progress Notes (Signed)
Pharmacy Antibiotic Note  [name] is a 52 y.o. male admitted on 01/25/2018 with cellulitis.  Pharmacy has been consulted for Vancomycin, meropenem dosing.  Plan:  Continue Vancomycin 1250mg  iv q12hr as ordered  Check Pk and Tr with next 2 doses  Goal AUC = 400 - 500 for all indications, except meningitis (goal AUC > 500 and Cmin 15-20 mcg/mL)   Weight: 281 lb 1.4 oz (127.5 kg)  Temp (24hrs), Avg:98.5 F (36.9 C), Min:98.1 F (36.7 C), Max:98.9 F (37.2 C)  Recent Labs  Lab 01/25/18 1755 01/25/18 1823 01/26/18 0558 01/28/18 0457 01/29/18 0444  WBC 8.3  --  8.6 8.4 6.6  CREATININE 1.05  --  0.87 0.67 0.68  LATICACIDVEN  --  1.78  --   --   --     Estimated Creatinine Clearance: 150.3 mL/min (by C-G formula based on SCr of 0.68 mg/dL).    Allergies  Allergen Reactions  . Ativan [Lorazepam] Other (See Comments)    Unknown ([facility]  . Latex Dermatitis  . Morphine And Related Other (See Comments)    [facility]  . Penicillins Other (See Comments)    Patient tolerated cephalosporins in past.  [facility]ergy [facility] Has patient had a PCN reaction causing immediate rash, facial/tongue/throat swelling, SOB or lightheadedness with hypotension: unknown Has patient had a PCN reaction causing severe rash involving mucus membranes or skin necrosis: unknown Has patient had a PCN reaction that required hospitalization: no Has patient had a PCN reaction occurring within the last 10 years: yes If all of the above answe  . Pyridium [Phenazopyridine Hcl] Other (See Comments)    [facility]'s [facility]  . Soy Allergy Other (See Comments)    [facility]    Antimicrobials this admission:  Vancomycin 01/25/2018 >> Meropenem 01/25/2018 >>2/1  Dose adjustments this admission:    Microbiology results:  1/29 BCx: 2/2 peds bottles CoNS; BCID= meth resistant, but C&S = susceptible >> would trust BCID over e-test suggesting presence of resistance gene but non-dominant strain. 1/30 Abscess Cx: MRSA 1/30 MRSA PCR: neg/neg 1/31 BCx: NGTD   Thank you for allowing pharmacy to be a part of this patient's care.  Bernadene Personrew [name], PharmD, BCPS [phone] 01/29/2018, 2:50 PM

## 2018-01-29 NOTE — Progress Notes (Signed)
Pt making verbal/inappropriate requests of male sitter at bedside. This Clinical research associatewriter in to speak with patient and requested that he not be verbally inappropriate, Pt then informed this Clinical research associatewriter, "you are not my nurse, get out of my room." Writer stepped outside of doorway, and continued to listen to patient be verbally inappropriate with his demands of sitter "getting in bed" with him. Sitter was then asked by this Clinical research associatewriter to sit in chair just outside of doorway and [employer] AC notified of inappropriate behavior of patient, and came to speak with patient. Male sitter was then brought in to sit with patient.

## 2018-01-29 NOTE — Progress Notes (Signed)
Patient Demographics:    [name], is a 52 y.o. male, DOB - [birth year], ZOX:[id number]  Admit date - 01/25/2018   Admitting Physician Delano Metz, MD  Outpatient Primary MD for the patient is Georgann Housekeeper, MD  LOS - 4  Chief Complaint  Patient presents with  . Abscess        Subjective:    [name] today has no fevers, no emesis,   was verbally inappropriate with male member of staff overnight  Assessment  & Plan :    Principal Problem:   Abdominal wall abscess Active Problems:   Anoxic brain injury (HCC)   History of atrial fibrillation   Cardiomyopathy, ischemic   PEG Gastrostomy tube in place [facility])   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 01/26/2018   Bedridden   [facility]adriplegia [facility])  Brief Summary :- 52 y.o. 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 fib not on systemic anticoagulation and adrenal insufficiency who was admitted on 01/25/18 with significant worsening abdominal wall abscess.  Patient is afebrile without leukocytosis however CT abdomen and pelvis and clinical exam reveals a rather large anterior abdominal wall abscess without involvement of the PEG tube site or involvement of PEG tube tract.  Patient failed oral doxycycline prior to admission Admission blood cultures from 01/25/2018 with methicillin-resistant coagulase negative staph, wound/OR/ Sterile cultures from 01/26/2018 with MRSA, repeat blood cultures from 01/27/2018 Neg so far  Plan:- 1)Anterior abd wall Abscess-status post I&D with Irrigation and debridement  Of anterior abdominal wall abscess with removal of infected mesh on 01/26/18 by Dr. Karie Soda. No fevers, no leukocytosis,  continue wound packing per general surgery, Admission blood cultures from 01/25/2018 with  methicillin-resistant coagulase negative staph, wound/OR/ Sterile cultures from 01/26/2018 withMRSA , repeat blood cultures from 01/27/2018 Neg so far, D/w Dr. Cliffton Asters from infectious disease, c/n  Vanco (started 01/25/18), STOP Meropenem  On 01/28/18, prior to admission patient did not do well on doxycycline, plan on discharging on Bactrim  2)History of hypothyroidism and adrenal insufficiency- a.m. serum cortisol was 5.3 (low) on 01/26/18, TSH is 0.3, patient received stress dose Solu-Cortef, give prednisone taper,  reduced levothyroxine to 175 mcg from 200 mcg.   3)Social/Ethics-patient with prior history of anoxic brain injury with some cognitive deficits,  patient is a full code, patients mother Talbert Forest cell is [phone] is the  decision maker, pt also has a spouse  4)NeuroPsych- patient with prior history of anoxic brain injury with some cognitive deficits, no significant behavioral disturbance at this time, continue Depakote, and Risperdal, Paxil and trazodone  5)SZ Disorder- stopped tramadol due to increased risk of seizures, continue Depakote  6)H/o CAD/Prior MI subsequent anoxic brain injury-continue metoprolol 5 mg twice daily, ?????? by patient is not on statin or aspirin  7)FEN-  PEG tube fell out on 01/28/2018, replaced by interventional radiology,  dietitian recommends  PEG tube feedings with Vital 1.5 @ 55 ml/hr via PEG-providing 1980 kcal, 89g protein and  1008 ml H2O for now, Once diet is advanced, can transition back to bolus feed with Jevity 1.2 regimen as pt primarily uses tube if consuming <50% of meals  8)S/p Fall -patient  fell out of bed on 01/28/2018, landing on the right side, CT head and pelvic x-rays without acute findings, PEG tube fell out during his fall, PEG tube was replaced by interventional radiology  Disposition Plan  : SNF in 1-2 days especially if repeat blood cultures remain negative  Consults  :  Gen Surgery/dietitian/infectious disease/interventional  radiology  DVT Prophylaxis  :  Lovenox   Lab Results  Component Value Date   PLT 189 01/29/2018    Inpatient Medications  Scheduled Meds: . divalproex  500 mg Oral TID  . enoxaparin (LOVENOX) injection  40 mg Subcutaneous QHS  . levothyroxine  175 mcg Oral QAC breakfast  . loratadine  10 mg Per Tube Daily  . metoprolol tartrate  12.5 mg Per Tube BID  . multivitamin  15 mL Per Tube Q1200  . pantoprazole sodium  40 mg Per Tube Q1200  . PARoxetine  20 mg Oral Daily  . polyethylene glycol  17 g Oral BID  . polyvinyl alcohol  1 drop Both Eyes QID  . predniSONE  40 mg Oral Q breakfast  . risperiDONE  2 mg Sublingual BID  . sennosides  15 mL Oral BID  . traZODone  50 mg Oral BID   Continuous Infusions: . dextrose 5 % and 0.45% NaCl 20 mL/hr at 01/28/18 1612  . feeding supplement (VITAL 1.5 CAL) 1,000 mL (01/29/18 1158)  . vancomycin Stopped (01/29/18 1348)   PRN Meds:.    Anti-infectives (From admission, onward)   Start     Dose/Rate Route Frequency Ordered Stop   01/26/18 1200  vancomycin (VANCOCIN) 1,250 mg in sodium chloride 0.9 % 250 mL IVPB     1,250 mg 166.7 mL/hr over 90 Minutes Intravenous Every 12 hours 01/26/18 0728     01/26/18 1000  vancomycin (VANCOCIN) 1,750 mg in sodium chloride 0.9 % 500 mL IVPB  Status:  Discontinued     1,750 mg 250 mL/hr over 120 Minutes Intravenous Every 12 hours 01/26/18 0650 01/26/18 0728   01/25/18 2245  meropenem (MERREM) 1 g in sodium chloride 0.9 % 100 mL IVPB  Status:  Discontinued     1 g 200 mL/hr over 30 Minutes Intravenous Every 8 hours 01/25/18 2242 01/28/18 1510   01/25/18 2100  vancomycin (VANCOCIN) 2,000 mg in sodium chloride 0.9 % 500 mL IVPB     2,000 mg 250 mL/hr over 120 Minutes Intravenous  Once 01/25/18 2043 01/26/18 0127        Objective:   Vitals:   01/28/18 2154 01/29/18 0133 01/29/18 0431 01/29/18 1325  BP: 123/85 130/82 109/75 108/72  Pulse: 73  69 63  Resp: 18  20 18   Temp: 98.9 F (37.2 C)  98.6 F  (37 C) 98.1 F (36.7 C)  TempSrc: Oral  Oral Oral  SpO2: 100%  98% 97%  Weight:   127.5 kg (281 lb 1.4 oz)     Wt Readings from Last 3 Encounters:  01/29/18 127.5 kg (281 lb 1.4 oz)  10/07/17 119.9 kg (264 lb 6.4 oz)  06/07/17 124.7 kg (275 lb)     Intake/Output Summary (Last 24 hours) at 01/29/2018 1443 Last data filed at 01/29/2018 1348 Gross per 24 hour  Intake 2560 ml  Output 400 ml  Net 2160 ml     Physical Exam  Gen:- Awake Alert, able to answer questions, follows commands HEENT:- [place].AT, No sclera icterus Neck-Supple Neck,No JVD,.  Lungs-  CTAB  CV- S1, S2 normal Abd-  +ve B.Sounds, Abd Soft,  scars from prior abdominal surgeries noted, PEG tube site intact (replaced 01/28/18), postop wound areas with packing, less swelling, less erythema and less drainage Neuro-generalized weakness and neuromuscular deficits at baseline, patient is able to move all 4 extremities, Psych-cognitive deficits consistent with anoxic brain injury with history of behavioral disturbance    Data Review:   Micro Results Recent Results (from the past 240 hour(s))  Culture, blood (routine x 2)     Status: Abnormal   Collection Time: 01/25/18  6:18 PM  Result Value Ref Range Status   Specimen Description BLOOD LEFT HAND  Final   Special Requests IN PEDIATRIC BOTTLE Blood Culture adequate volume  Final   Culture  Setup Time   Final    GRAM POSITIVE COCCI IN PEDIATRIC BOTTLE CRITICAL VALUE NOTED.  VALUE IS CONSISTENT WITH PREVIOUSLY REPORTED AND CALLED VALUE.    Culture (A)  Final    STAPHYLOCOCCUS SPECIES (COAGULASE NEGATIVE) SUSCEPTIBILITIES PERFORMED ON PREVIOUS CULTURE WITHIN THE LAST 5 DAYS. Performed at [facility], [address], [place], [place] [address]    Report Status 01/28/2018 FINAL  Final  Culture, blood (routine x 2)     Status: Abnormal (Preliminary result)   Collection Time: 01/25/18  6:18 PM  Result Value Ref Range Status   Specimen Description BLOOD RIGHT HAND   Final   Special Requests IN PEDIATRIC BOTTLE Blood Culture adequate volume  Final   Culture  Setup Time   Final    GRAM POSITIVE COCCI IN PEDIATRIC BOTTLE CRITICAL RESULT CALLED TO, READ BACK BY AND VERIFIED WITH: M. Lilliston Pharm.D. 17:10 01/26/18 ([email or web address])    Culture (A)  Final    STAPHYLOCOCCUS SPECIES (COAGULASE NEGATIVE) CULTURE REINCUBATED FOR BETTER GROWTH Performed at [facility], [address] [place], [place] [address]    Report Status PENDING  Incomplete   Organism ID, Bacteria STAPHYLOCOCCUS SPECIES (COAGULASE NEGATIVE)  Final      Susceptibility   Staphylococcus species (coagulase negative) - MIC*    CIPROFLOXACIN <=0.5 SENSITIVE Sensitive     ERYTHROMYCIN 0.5 SENSITIVE Sensitive     GENTAMICIN <=0.5 SENSITIVE Sensitive     OXACILLIN <=0.25 SENSITIVE Sensitive     TETRACYCLINE >=16 RESISTANT Resistant     VANCOMYCIN 1 SENSITIVE Sensitive     TRIMETH/SULFA <=10 SENSITIVE Sensitive     CLINDAMYCIN <=0.25 SENSITIVE Sensitive     RIFAMPIN <=0.5 SENSITIVE Sensitive     Inducible Clindamycin NEGATIVE Sensitive     * STAPHYLOCOCCUS SPECIES (COAGULASE NEGATIVE)  Blood Culture ID Panel (Reflexed)     Status: Abnormal   Collection Time: 01/25/18  6:18 PM  Result Value Ref Range Status   Enterococcus species NOT DETECTED NOT DETECTED Final   Listeria monocytogenes NOT DETECTED NOT DETECTED Final   Staphylococcus species DETECTED (A) NOT DETECTED Final    Comment: Methicillin (oxacillin) resistant coagulase negative staphylococcus. Possible blood culture contaminant (unless isolated from more than one blood culture draw or clinical case suggests pathogenicity). No antibiotic treatment is indicated for blood  culture contaminants. CRITICAL RESULT CALLED TO, READ BACK BY AND VERIFIED WITH: M. Lilliston Pharm.D. 17:10 01/26/18 ([email or web address])    Staphylococcus aureus NOT DETECTED NOT DETECTED Final   Methicillin resistance DETECTED (A) NOT DETECTED Final    Comment: CRITICAL  RESULT CALLED TO, READ BACK BY AND VERIFIED WITH: M. Lilliston Pharm.D. 17:10 01/26/18 ([email or web address])    Streptococcus species NOT DETECTED NOT DETECTED Final   Streptococcus agalactiae NOT DETECTED NOT DETECTED Final   Streptococcus pneumoniae  NOT DETECTED NOT DETECTED Final   Streptococcus pyogenes NOT DETECTED NOT DETECTED Final   Acinetobacter baumannii NOT DETECTED NOT DETECTED Final   Enterobacteriaceae species NOT DETECTED NOT DETECTED Final   Enterobacter cloacae complex NOT DETECTED NOT DETECTED Final   Escherichia coli NOT DETECTED NOT DETECTED Final   Klebsiella oxytoca NOT DETECTED NOT DETECTED Final   Klebsiella pneumoniae NOT DETECTED NOT DETECTED Final   Proteus species NOT DETECTED NOT DETECTED Final   Serratia marcescens NOT DETECTED NOT DETECTED Final   Haemophilus influenzae NOT DETECTED NOT DETECTED Final   Neisseria meningitidis NOT DETECTED NOT DETECTED Final   Pseudomonas aeruginosa NOT DETECTED NOT DETECTED Final   Candida albicans NOT DETECTED NOT DETECTED Final   Candida glabrata NOT DETECTED NOT DETECTED Final   Candida krusei NOT DETECTED NOT DETECTED Final   Candida parapsilosis NOT DETECTED NOT DETECTED Final   Candida tropicalis NOT DETECTED NOT DETECTED Final  Surgical PCR screen     Status: None   Collection Time: 01/26/18  1:20 PM  Result Value Ref Range Status   MRSA, PCR NEGATIVE NEGATIVE Final   Staphylococcus aureus NEGATIVE NEGATIVE Final    Comment: (NOTE) The Xpert SA Assay (FDA approved for NASAL specimens in patients 51 years of age and older), is one component of a comprehensive surveillance program. It is not intended to diagnose infection nor to guide or monitor treatment.   Aerobic/Anaerobic Culture (surgical/deep wound)     Status: None (Preliminary result)   Collection Time: 01/26/18  6:23 PM  Result Value Ref Range Status   Specimen Description   Final    ABSCESS ABDOMEN Performed at [facility], [address] [place], [place] [address]    Special Requests   Final    NONE Performed at [facility], [address] [place], [place] [address]    Gram Stain   Final    RARE WBC PRESENT, PREDOMINANTLY PMN MODERATE GRAM POSITIVE COCCI IN PAIRS IN CLUSTERS Performed at [facility], [address] [place], [place] [address]    Culture   Final    ABUNDANT METHICILLIN RESISTANT STAPHYLOCOCCUS AUREUS NO ANAEROBES ISOLATED; CULTURE IN PROGRESS FOR 5 DAYS    Report Status PENDING  Incomplete   Organism ID, Bacteria METHICILLIN RESISTANT STAPHYLOCOCCUS AUREUS  Final      Susceptibility   Methicillin resistant staphylococcus aureus - MIC*    CIPROFLOXACIN >=8 RESISTANT Resistant     ERYTHROMYCIN >=8 RESISTANT Resistant     GENTAMICIN <=0.5 SENSITIVE Sensitive     OXACILLIN >=4 RESISTANT Resistant     TETRACYCLINE <=1 SENSITIVE Sensitive     VANCOMYCIN <=0.5 SENSITIVE Sensitive     TRIMETH/SULFA <=10 SENSITIVE Sensitive     RIFAMPIN <=0.5 SENSITIVE Sensitive     Inducible Clindamycin NEGATIVE Sensitive     * ABUNDANT METHICILLIN RESISTANT STAPHYLOCOCCUS AUREUS  Culture, blood (Routine X 2) w Reflex to ID Panel     Status: None (Preliminary result)   Collection Time: 01/27/18  5:51 PM  Result Value Ref Range Status   Specimen Description   Final    BLOOD LEFT ANTECUBITAL Performed at [facility], [address] [place], [place] [address]    Special Requests   Final    IN PEDIATRIC BOTTLE Blood Culture adequate volume Performed at [facility], [address] [place], [place] [address]    Culture   Final    NO GROWTH 2 DAYS Performed at  [facility], [address], [place], [place] [address]    Report Status PENDING  Incomplete  Culture, blood (Routine X 2) w Reflex to ID Panel     Status: None (Preliminary result)   Collection Time: 01/27/18  7:08 PM  Result Value Ref Range Status   Specimen Description   Final     BLOOD RIGHT HAND Performed at [facility], [address], [place], [place] [address]    Special Requests   Final    IN PEDIATRIC BOTTLE Blood Culture adequate volume Performed at [facility], [address], [place], [place] [address]    Culture   Final    NO GROWTH 2 DAYS Performed at [facility], [address] [place], [place] [address]    Report Status PENDING  Incomplete    Radiology Reports Ct Head Wo Contrast  Result Date: 01/28/2018 CLINICAL DATA:  Fall out of bed, history of anoxic brain injury status post cardiac arrest EXAM: CT HEAD WITHOUT CONTRAST TECHNIQUE: Contiguous axial images were obtained from the base of the skull through the vertex without intravenous contrast. COMPARISON:  10/02/2017 FINDINGS: Motion degraded images. Brain: No evidence of acute infarction, hemorrhage, hydrocephalus, extra-axial collection or mass lesion/mass effect. Mild cortical and central atrophy. Subcortical white matter and periventricular small vessel ischemic changes. Vascular: No hyperdense vessel or unexpected calcification. Skull: Normal. Negative for fracture or focal lesion. Sinuses/Orbits: The visualized paranasal sinuses are essentially clear. The mastoid air cells are unopacified. Other: None. IMPRESSION: No evidence of acute intracranial abnormality. Atrophy with small vessel ischemic changes. Electronically Signed   By: Charline Bills M.D.   On: 01/28/2018 08:35   Ct Abdomen Pelvis W Contrast  Result Date: 01/25/2018 CLINICAL DATA:  Abscess near G-tube insertion site. Redness/swelling. History of right nephrectomy, peg tube placement, AKI EXAM: CT ABDOMEN AND PELVIS WITH CONTRAST TECHNIQUE: Multidetector CT imaging of the abdomen and pelvis was performed using the standard protocol following bolus administration of intravenous contrast. CONTRAST:  ISOVUE-300 IOPAMIDOL (ISOVUE-300) INJECTION 61% COMPARISON:  CT abdomen dated  10/04/2017. FINDINGS: Lower chest: No acute abnormality. Hepatobiliary: No focal liver abnormality is seen. Status post cholecystectomy. No biliary dilatation. Pancreas: Unremarkable. No pancreatic ductal dilatation or surrounding inflammatory changes. Spleen: Normal in size without focal abnormality. Adrenals/Urinary Tract: Right kidney is surgically absent. Adrenal glands appear normal. Stable hypodense mass within the upper pole of the left kidney, again measuring 2.3 cm, described as benign on earlier MRI abdomen. No left-sided hydronephrosis or perinephric fluid. No ureteral or bladder calculi. Stomach/Bowel: No dilated large or small bowel loops. No bowel wall thickening or evidence of bowel wall inflammation seen. Peg tube appears appropriately positioned in the stomach. Vascular/Lymphatic: Mild aortic atherosclerosis. No enlarged lymph nodes seen. Reproductive: Prostate is unremarkable. Other: Lobular abscess collection within the subcutaneous soft tissues of the anterior abdominal wall, supraumbilical, measuring 8.7 x 6 x 6.1 cm (transverse by AP by craniocaudal dimensions). The abscess collection involves the underlying anterior abdominal wall rectus musculature but does not extend into the intraperitoneal abdomen. No intraperitoneal free fluid or abscess collection. No free intraperitoneal air. 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 No acute or suspicious osseous finding. IMPRESSION: 1. Anterior abdominal wall abscess collection, supraumbilical near the midline, measuring 8.7 x 6 x 6.1 cm, centered within the subcutaneous soft tissues but extending into the underlying anterior abdominal wall rectus musculature. No intraperitoneal  extension. 2. The abscess collection within the anterior abdominal wall is located approximately 3 cm to the right of the PEG tube insertion site. The abscess does not appear to  involve the PEG tube insertion site. 3. Peg tube appears adequately positioned in the stomach. 4. Right kidney is surgically absent. 5. Aortic atherosclerosis. 6. 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 Electronically Signed   By: Bary Richard M.D.   On: 01/25/2018 20:18   Dg Pelvis Portable  Result Date: 01/28/2018 CLINICAL DATA:  Fall.  Bilateral hip pain. EXAM: PORTABLE PELVIS 1-2 VIEWS COMPARISON:  None. FINDINGS: There is no evidence of pelvic fracture or diastasis. No pelvic bone lesions are seen. IMPRESSION: Negative. Electronically Signed   By: Kennith Center M.D.   On: 01/28/2018 10:22   Ir Replace G-tube Simple Wo Fluoro  Result Date: 01/28/2018 INDICATION: 52 year old male with a history of percutaneous gastrostomy tube, and accidental removal EXAM: BEDSIDE EXCHANGE OF PERCUTANEOUS GASTROSTOMY MEDICATIONS: None ANESTHESIA/SEDATION: None CONTRAST:  None FLUOROSCOPY TIME:  None COMPLICATIONS: None PROCEDURE: Informed written consent was obtained from the patient after a thorough discussion of the procedural risks, benefits and alternatives. All questions were addressed. A timeout was performed prior to the initiation of the procedure. Bedside exchange of 24 French balloon retention gastrostomy was performed. Saline was used to inflate the retention balloon. Patient tolerated the procedure well and remained hemodynamically stable throughout. No complications were encountered and no significant blood loss. IMPRESSION: Status post bedside exchange of balloon retention percutaneous gastrostomy with placement of a 24 French G-tube. Electronically Signed   By: Gilmer Mor D.O.   On: 01/28/2018 16:16     CBC Recent Labs  Lab 01/25/18 1755 01/26/18 0558 01/28/18 0457 01/29/18 0444  WBC 8.3 8.6 8.4 6.6  HGB 12.6* 11.3* 11.3* 11.1*  HCT 39.0 35.4* 34.9* 35.0*  PLT 174 162 172 189  MCV 83.7 82.7 81.7 83.9  MCH  27.0 26.4 26.5 26.6  MCHC 32.3 31.9 32.4 31.7  RDW 14.7 15.1 14.6 14.8  LYMPHSABS 1.9  --   --   --   MONOABS 1.0  --   --   --   EOSABS 0.1  --   --   --   BASOSABS 0.0  --   --   --     Chemistries  Recent Labs  Lab 01/25/18 1755 01/25/18 2328 01/26/18 0558 01/28/18 0457 01/29/18 0444  NA 142  --  142 141 143  K 4.6  --  3.7 3.3* 3.7  CL 107  --  109 109 109  CO2 26  --  27 26 29   GLUCOSE 92  --  80 109* 91  BUN 15  --  13 16 14   CREATININE 1.05  --  0.87 0.67 0.68  CALCIUM 8.9  --  8.6* 8.4* 8.5*  MG  --  1.6*  --   --   --   AST 15  --   --   --   --   ALT 9*  --   --   --   --   ALKPHOS 71  --   --   --   --   BILITOT 0.1*  --   --   --   --    ------------------------------------------------------------------------------------------------------------------ No results for input(s): CHOL, HDL, LDLCALC, TRIG, CHOLHDL, LDLDIRECT in the last 72 hours.  Lab Results  Component Value Date   HGBA1C  4.6 11/24/2011   ------------------------------------------------------------------------------------------------------------------ No results for input(s): TSH, T4TOTAL, T3FREE, THYROIDAB in the last 72 hours.  Invalid input(s): FREET3 ------------------------------------------------------------------------------------------------------------------ No results for input(s): VITAMINB12, FOLATE, FERRITIN, TIBC, IRON, RETICCTPCT in the last 72 hours.  Coagulation profile Recent Labs  Lab 01/25/18 2328  INR 1.14    No results for input(s): DDIMER in the last 72 hours.  Cardiac Enzymes No results for input(s): CKMB, TROPONINI, MYOGLOBIN in the last 168 hours.  Invalid input(s): CK ------------------------------------------------------------------------------------------------------------------ No results found for: BNP   Shon Hale M.D on 01/29/2018 at 2:43 PM  Between 7am to 7pm - Pager - [phone]  After 7pm go to [email or web address] - password [email or web address]  [facility] -  Office  [phone]   Voice Recognition Reubin [place] dictation system was used to create this note, attempts have been made to correct errors. Please contact the author with questions and/or clarifications.

## 2018-01-30 LAB — CULTURE, BLOOD (ROUTINE X 2): SPECIAL REQUESTS: ADEQUATE

## 2018-01-30 LAB — VANCOMYCIN, TROUGH: Vancomycin Tr: 22 ug/mL (ref 15–20)

## 2018-01-30 LAB — GLUCOSE, CAPILLARY
GLUCOSE-CAPILLARY: 118 mg/dL — AB (ref 65–99)
Glucose-Capillary: 99 mg/dL (ref 65–99)

## 2018-01-30 LAB — CREATININE, SERUM
Creatinine, Ser: 0.65 mg/dL (ref 0.61–1.24)
GFR calc Af Amer: >60 mL/min (ref >60)
GFR calc non Af Amer: >60 mL/min (ref >60)

## 2018-01-30 LAB — VANCOMYCIN, PEAK
VANCOMYCIN PK: 50 ug/mL — AB (ref 30–40)
Vancomycin Pk: 38 ug/mL (ref 30–40)

## 2018-01-30 MED ORDER — VANCOMYCIN HCL IN DEXTROSE 750-5 MG/150ML-% IV SOLN
750.0000 mg | Freq: Two times a day (BID) | INTRAVENOUS | Status: DC
  Filled 2018-01-30: qty 150

## 2018-01-30 MED ORDER — PREDNISONE 10 MG PO TABS: ORAL_TABLET | ORAL | 0 refills | Status: DC

## 2018-01-30 MED ORDER — ASPIRIN EC 81 MG PO TBEC: 81.0000 mg | DELAYED_RELEASE_TABLET | Freq: Every day | ORAL | 2 refills | Status: AC

## 2018-01-30 MED ORDER — SULFAMETHOXAZOLE-TRIMETHOPRIM 200-40 MG/5ML PO SUSP: 20.0000 mL | Freq: Two times a day (BID) | ORAL | 0 refills | Status: AC

## 2018-01-30 NOTE — Progress Notes (Signed)
Discharge instructions reviewed with [facility] at [facility]. All questions answered.

## 2018-01-30 NOTE — Discharge Instructions (Signed)
WOUND CARE -continue wet-to-dry dressings twice a day until the wound sites are completely healed.  It is important that the wound be kept open.   -Keeping the skin edges apart will allow the wound to gradually heal from the base upwards.   - If the skin edges of the wound close too early, a new fluid pocket can form and infection can occur. -This is the reason to pack deeper wounds with gauze or ribbon -This is why drained wounds cannot be sewed closed right away  A healthy wound should form a lining of bright red "beefy" granulating tissue that will help shrink the wound and help the edges grow new skin into it.   -A little mucus / yellow discharge is normal (the body's natural way to try and form a scab) and should be gently washed off with soap and water with daily dressing changes.  -Green or foul smelling drainage implies bacterial colonization and can slow wound healing - a short course of antibiotic ointment (3-5 days) can help it clear up.  Call the doctor if it does not improve or worsens  -Avoid use of antibiotic ointments for more than a week as they can slow wound healing over time.    -Sometimes other wound care products will be used to reduce need for dressing changes and/or help clean up dirty wounds -Sometimes the surgeon needs to debride the wound in the office to remove dead or infected tissue out of the wound so it can heal more quickly and safely.    Change the dressing at least once a day -Wash the wound with mild soap and water gently every day.  It is good to shower or bathe the wound to help it clean out. -Use clean 4x4 gauze for medium/large wounds or ribbon plain NU-gauze for smaller wounds (it does not need to be sterile, just clean) -Keep the raw wound moist with a little saline or KY (saline) gel on the gauze.  -A dry wound will take longer to heal.  -Keep the skin dry around the wound to prevent breakdown and irritation. -Pack the wound down to the base -The goal  is to keep the skin apart, not overpack the wound -Use a Q-tip or blunt-tipped kabob stick toothpick to push the gauze down to the base in narrow or deep wounds   -Cover with a clean gauze and tape -paper or Medipore tape tend to be gentle on the skin -rotate the orientation of the tape to avoid repeated stress/trauma on the skin -using an ACE or Coban wrap on wounds on arms or legs can be used instead.  Complete all antibiotics through the entire prescription to help the infection heal and prevent new places of infection   Returning the see the surgeon is helpful to follow the healing process and help the wound close as fast as possible.   1)Abdominal wound dressing changes as advised by surgical team outlined in AVS instructions 2)Bactrim DS 800/160 mg (20 mls) Twice a day via PEG x 14 days for MRSA abdominal wound infection 3) BMP every  Friday times 2 weeks while on Bactrim antibiotic 4) wound care consult at the skilled nursing facility 5) dietitian/nutritionist consult at the skilled nursing facility to help with PEG tube feeding  6) speech and swallow evaluation and  consult at the facility to see if pt may resume oral intake per previous protocol

## 2018-01-30 NOTE — Progress Notes (Signed)
4 Days Post-Op  Subjective: Stable.  Awake.  Mostly nonverbal.  Inappropriate.  Discussed outpatient wound management and antibiotics with Dr.  Mariea Clonts Possible transfer back to SNF today or tomorrow  Wound culture growing MRSA Recommend oral BactrimDS BID  for at least 14 days when transitions back to outpatient management  Objective: Vital signs in last 24 hours: Temp:  [97.6 F (36.4 C)-98.1 F (36.7 C)] 97.6 F (36.4 C) (02/03 0547) Pulse Rate:  [60-66] 60 (02/03 0547) Resp:  [16-18] 16 (02/03 0547) BP: (105-111)/(69-80) 105/69 (02/03 0547) SpO2:  [94 %-97 %] 95 % (02/03 0547) Weight:  [127.5 kg (281 lb)] 127.5 kg (281 lb) (02/03 0547) Last BM Date: 01/29/18  Intake/Output from previous day: 02/02 0701 - 02/03 0700 In: 2149 [I.V.:384; NG/GT:1265; IV Piggyback:500] Out: 400 [Urine:400] Intake/Output this shift: Total I/O In: 330 [P.O.:30; I.V.:80; NG/GT:220] Out: -    General appearance: alert, nonverbal.  Inappropriate.  No agitation. Resp: clear to auscultation bilaterally GI: Soft nontender, Foley has been replaced in the G-tube site.  Open wound shows erythema has subsided..  Drainage from both sites is markedly reduced and the dressing is fairly clean coming back out.  No odor     Lab Results:  Results for orders placed or performed during the hospital encounter of 01/25/18 (from the past 24 hour(s))  Glucose, capillary     Status: Abnormal   Collection Time: 01/29/18 11:31 AM  Result Value Ref Range   Glucose-Capillary 117 (H) 65 - 99 mg/dL   Comment 1 Notify RN    Comment 2 Document in Chart   Glucose, capillary     Status: Abnormal   Collection Time: 01/29/18  5:25 PM  Result Value Ref Range   Glucose-Capillary 116 (H) 65 - 99 mg/dL  Glucose, capillary     Status: None   Collection Time: 01/29/18 11:55 PM  Result Value Ref Range   Glucose-Capillary 93 65 - 99 mg/dL  Vancomycin, peak     Status: Abnormal   Collection Time: 01/30/18  2:04 AM  Result  Value Ref Range   Vancomycin Pk 50 (H) 30 - 40 ug/mL  Vancomycin, peak     Status: None   Collection Time: 01/30/18  4:34 AM  Result Value Ref Range   Vancomycin Pk 38 30 - 40 ug/mL  Glucose, capillary     Status: None   Collection Time: 01/30/18  5:42 AM  Result Value Ref Range   Glucose-Capillary 99 65 - 99 mg/dL     Studies/Results: No results found.  . divalproex  500 mg Oral TID  . enoxaparin (LOVENOX) injection  40 mg Subcutaneous QHS  . levothyroxine  175 mcg Oral QAC breakfast  . loratadine  10 mg Per Tube Daily  . metoprolol tartrate  12.5 mg Per Tube BID  . multivitamin  15 mL Per Tube Q1200  . pantoprazole sodium  40 mg Per Tube Q1200  . PARoxetine  20 mg Oral Daily  . polyethylene glycol  17 g Oral BID  . polyvinyl alcohol  1 drop Both Eyes QID  . predniSONE  40 mg Oral Q breakfast  . risperiDONE  2 mg Sublingual BID  . sennosides  15 mL Oral BID  . traZODone  50 mg Oral BID     Assessment/Plan: s/p Procedure(s): IRRIGATION AND DEBRIDEMENT ABDOMINAL WALL ABSCESS WITH REMOVAL OF INFECTED MESH  Abdominal wall abscess Irrigation and debridement abdominal wall abscess with removal of infected mesh, 01/26/18 Dr. Karie Soda POD 3  History of anoxic brain injury-cardiac arrest PEG dependent History of seizure disorder Paroxysmal atrial fibrillation-not on chronic anticoagulation Adrenal insufficiency  FEN: IV fluids/tube feeding ID: Vancomycin/meropenem 1/29=>>day 5 DVT: Lovenox Foley: External catheter Follow-up: Dr. Karie SodaSteven Gross  Plan: Continue wet-to-dry dressing changes           Wound care has stabilized and is now appropriate for outpatient management            If internal medicine desires, patient may be transferred back to SNF.            Recommend Bactrim DS 1 twice a day for at least 14 days   Follow-up information is in the AVS. Follow-up with Dr. gross     @PROBHOSP @  LOS: 5 days    Ernestene MentionHaywood M Braylin Formby 01/30/2018  . .prob

## 2018-01-30 NOTE — Progress Notes (Addendum)
Pt discharged to return to his SNF- [facility]- report [phone]  Will arrange [facility] transportation once ready for DC (awaiting midline removal per RN)  Provided pt's DC summary to SNF and confirmed with director they are aware of pt's return today  Spoke with pt's mother- aware and agreeable to plan. States, "Please make sure he is dry when he leaves- every time he leaves the hospital he is wet."  Ilean SkillMeghan Destry Bezdek, MSW, LCSW Clinical Social Work 01/30/2018 [phone] weekend coverage for [phone]

## 2018-01-30 NOTE — Discharge Summary (Signed)
[name], is a 52 y.o. male  DOB 1966/07/20  MRN [id number].  Admission date:  01/25/2018  Admitting Physician  Delano Metz, MD  Discharge Date:  01/30/2018   Primary MD  Georgann Housekeeper, MD  Recommendations for primary care physician for things to follow:  1)Abdominal wound dressing changes as advised by surgical team outlined in AVS instructions 2)Bactrim DS 800/160 mg (20 mls) Twice a day via PEG x 14 days for MRSA abdominal wound infection 3) BMP every  Friday times 2 weeks while on Bactrim antibiotic 4) wound care consult at the skilled nursing facility 5) dietitian/nutritionist consult at the skilled nursing facility to help with PEG tube feeding  6) speech and swallow evaluation and  consult at the facility to see if pt may resume oral intake per previous protocol 7)Avoid ibuprofen/Advil/Aleve/Motrin/Goody Powders/Naproxen/BC powders as these will make you more likely to bleed and can cause stomach ulcers  Admission Diagnosis  abd abcess    Discharge Diagnosis  abd abcess     Principal Problem:   Abdominal wall abscess Active Problems:   Anoxic brain injury (HCC)   History of atrial fibrillation   Cardiomyopathy, ischemic   PEG Gastrostomy tube in place [facility])   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 01/26/2018   Bedridden   Quadriplegia [facility])      Past Medical History:  Diagnosis Date  . Addison disease (HCC)   . Anoxic brain injury (HCC)   . Anxiety   . Aspiration pneumonia (HCC)   . Atrial fibrillation (HCC)   . Chronic constipation 11/23/2011  . Dysphagia   . Encephalopathy   . Glaucoma   . Glucocorticoid deficiency (HCC)   . History of recurrent UTIs   . Hyperlipemia   . Hypothyroidism   . Mental disorder   . Myocardial infarction (HCC)   . Pneumoperitoneum of unknown etiology 12/21/2011  . Quadriplegia (HCC)   . Reflux   . Seizure disorder [facility])      Past Surgical History:  Procedure Laterality Date  . DEBRIDEMENT OF ABDOMINAL WALL ABSCESS  01/26/2018  . HERNIA MESH REMOVAL  01/26/2018   Infected mesh - removed w I&D abd wall abscess  . IR GENERIC HISTORICAL  10/20/2016   IR REPLC GASTRO/COLONIC TUBE PERCUT W/FLUORO 10/20/2016 Steven Balm, MD [facility]-INTERV RAD  . IR REPLACE G-TUBE SIMPLE WO FLUORO  01/28/2018  . IR REPLC GASTRO/COLONIC TUBE PERCUT W/FLUORO  04/21/2017  . IR REPLC GASTRO/COLONIC TUBE PERCUT W/FLUORO  07/22/2017  . IR REPLC GASTRO/COLONIC TUBE PERCUT W/FLUORO  11/24/2017  . IRRIGATION AND DEBRIDEMENT ABSCESS N/A 01/26/2018   Procedure: IRRIGATION AND DEBRIDEMENT ABDOMINAL WALL ABSCESS WITH REMOVAL OF INFECTED MESH;  Surgeon: Karie Soda, MD;  Location: [facility]S;  Service: General;  Laterality: N/A;  . NEPHRECTOMY  unknown  . PEG TUBE PLACEMENT         HPI  from the history and physical done on the day of admission:    Chief Complaint: Abdominal wall abscess  HPI: [name] is a 52 y.o.  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 fib not on systemic anticoagulation, GERD and adrenal insufficiency who presents to the ED from his significant worsening abdominal wall abscess.  Patient has been on doxycycline in the outpatient setting without improvement in his symptoms.  He reports pain in his abdomen, but denies fever, chills, night sweats, nausea, vomiting, urinary changes, difficulties with this PEG tube, or other infectious symptoms.  Patient is unable to verbalize much to his history of anoxic brain injury.  Of note, CT abdomen pelvis in October 2017 did demonstrate a large soft tissue mass also concerning for abscess versus hematoma in October 2017, surgical intervention was deferred at that time.  ED Course: In the ED, patient afebrile, heart rate 86, blood pressure normotensive, saturating  comfortably on room air.  Labs notable for white count 8.3, Hgb 12.6, platelets 174, normal electro lites, normal renal function, normal LFTs.  CT abdomen pelvis showed large abdominal wall abscess, enlarged from prior study in October 2017; area involved does not include the PEG or PEG tract.  General surgery was consulted with plans for operative intervention.  He received vancomycin and meropenem in the ED.   Hospital Course:     Brief Summary :- 52 y.o.malewith medical history significantforAnoxic Brain injury secondary to remote cardiac arrest, PEG dependent, CAD, hypothyroidism, seizure disorder, paroxysmal A. fib not on systemic anticoagulation and adrenal insufficiency who was admitted on 01/25/18 with significant worsening abdominal wall abscess.  Patient is afebrile without leukocytosis however CT abdomen and pelvis and clinical exam reveals a rather large anterior abdominal wall abscess without involvement of the PEG tube site or involvement of PEG tube tract.  Patient failed oral doxycycline prior to admission Admission blood cultures from 01/25/2018 with methicillin-resistant coagulase negative staph, wound/OR/ Sterile cultures from 01/26/2018 with MRSA, repeat blood cultures from 01/27/2018 Neg so far, he was treated with IV vancomycin and meropenem, will discharge back to sniff on Bactrim  Plan:- 1)Anterior abd wall Abscess-status post I&D with Irrigation and debridement  Of anterior abdominal wall abscess with removal of infected mesh on 01/26/18 by Dr. Karie Soda. No fevers, no leukocytosis,  continue wound packing per general surgery, Admission blood cultures from 01/25/2018 with methicillin-resistant coagulase negative staph, wound/OR/ Sterile cultures from 01/26/2018 withMRSA , repeat blood cultures from 01/27/2018 Neg so far, D/w Dr. Cliffton Asters from infectious disease, c/n  Vanco (started 01/25/18), STOP Meropenem  On 01/28/18, prior to admission patient did not do well on doxycycline,   will discharge back to sniff on Bactrim  2)History of hypothyroidism and adrenal insufficiency- a.m. serum cortisol was 5.3 (low) on 01/26/18, TSH is 0.3, patient received stress dose Solu-Cortef, give prednisone taper,  reduced levothyroxine to 175 mcg from 200 mcg.   3)Social/Ethics-patient with prior history of anoxic brain injury with some cognitive deficits,  patient is a full code, patients mother Talbert Forest cell is [phone] is the  decision maker, pt also has a spouse  4)NeuroPsych- patient with prior history of anoxic brain injury with some cognitive deficits, no significant behavioral disturbance at this time, continue Depakote, and Risperdal, Paxil and trazodone  5)SZ Disorder- stopped tramadol due to increased risk of seizures, continue Depakote  6)H/o CAD/Prior MI subsequent anoxic brain injury-continue metoprolol 5 mg twice daily, ?????? by patient is not on statin or aspirin, PCP at SNF to readdress indications for statin, will start aspirin 81 mg daily, Avoid ibuprofen/Advil/Aleve/Motrin/Goody Powders/Naproxen/BC powders  as these will make you more likely to bleed and can cause stomach ulcers   7)FEN-  PEG tube fell out on 01/28/2018, replaced by interventional radiology,  dietitian recommends  PEG tube feedings with Vital 1.5 @ 55 ml/hr via PEG-providing 1980 kcal, 89g protein and 1008 ml H2O for now, Once diet is advanced, can transition back to bolus feed with Jevity 1.2 regimen as pt primarily uses tube if consuming <50% of meals  8)S/p Fall -patient fell out of bed on 01/28/2018, landing on the right side, CT head and pelvic x-rays without acute findings, PEG tube fell out during his fall, PEG tube was replaced by interventional radiology, clinical exam reveals no obvious injuries or deformity  Disposition Plan  : SNF ,  repeat blood cultures remain negative  Consults  :  Gen Surgery/dietitian/infectious disease/interventional radiology  Discharge Condition:  stable  Follow UP   Contact information for follow-up providers    Karie Soda, MD Follow up.   Specialty:  General Surgery Why:  Call for follow-up appointment 2 weeks after you discharge.  Be at the office 40 minutes early for check-in.  He will need photo ID and insurance information.  Continue wet-to-dry dressings twice a day until both sites are completely healed. Contact information: [address] [place] [address] [phone]            Contact information for after-discharge care    Destination    [facility] Follow up.   Service:  Skilled Nursing Contact information[address] [place] [address] [phone]                  Diet and Activity recommendation:  As advised  Discharge Instructions    Discharge Instructions    Call MD for:   Complete by:  As directed    Call MD for:  difficulty breathing, headache or visual disturbances   Complete by:  As directed    Call MD for:  persistant dizziness or light-headedness   Complete by:  As directed    Call MD for:  persistant nausea and vomiting   Complete by:  As directed    Call MD for:  redness, tenderness, or signs of infection (pain, swelling, redness, odor or green/yellow discharge around incision site)   Complete by:  As directed    Call MD for:  severe uncontrolled pain   Complete by:  As directed    Call MD for:  temperature >100.4   Complete by:  As directed    Discharge instructions   Complete by:  As directed    1)Abdominal wound dressing changes as advised by surgical team outlined in AVS instructions 2)Bactrim DS 800/160 mg (20 mls) Twice a day via PEG x 14 days for MRSA abdominal wound infection 3) BMP every  Friday times 2 weeks while on Bactrim antibiotic 4) wound care consult at the skilled nursing facility 5) dietitian/nutritionist consult at the skilled nursing facility to help with PEG tube feeding  6) speech and swallow evaluation  and  consult at the facility to see if pt may resume oral intake per previous protocol 7)Avoid ibuprofen/Advil/Aleve/Motrin/Goody Powders/Naproxen/BC powders as these will make you more likely to bleed and can cause stomach ulcers   Increase activity slowly   Complete by:  As directed         Discharge Medications     Allergies as of 01/30/2018      Reactions   Ativan [  lorazepam] Other (See Comments)   Unknown (not listed on physician's orders from [facility] 07/05/15)   Latex Dermatitis   Morphine And Related Other (See Comments)   Listed on physician's orders from [facility] 07/05/15   Penicillins Other (See Comments)   Patient tolerated cephalosporins in past.  Allergy listed on physician's orders from [facility] 07/05/15 Has patient had a PCN reaction causing immediate rash, facial/tongue/throat swelling, SOB or lightheadedness with hypotension: unknown Has patient had a PCN reaction causing severe rash involving mucus membranes or skin necrosis: unknown Has patient had a PCN reaction that required hospitalization: no Has patient had a PCN reaction occurring within the last 10 years: yes If all of the above answe   Pyridium [phenazopyridine Hcl] Other (See Comments)   Listed on physician's orders from [facility] 07/05/15   Soy Allergy Other (See Comments)   Listed on physician's orders from [facility] 07/05/15      Medication List    STOP taking these medications   cefdinir 125 MG/5ML suspension Commonly known as:  OMNICEF   docusate sodium 100 MG capsule Commonly known as:  COLACE   loratadine 10 MG tablet Commonly known as:  CLARITIN     TAKE these medications   acetaminophen 325 MG tablet Commonly known as:  TYLENOL Place 650 mg into feeding tube every 4 (four) hours as needed for mild pain.   aspirin EC 81 MG tablet Take 1 tablet (81 mg total) by mouth daily. With food   CERTAVITE/ANTIOXIDANTS Liqd 15 mLs by PEG Tube route daily at 12 noon.   divalproex 125 MG  capsule Commonly known as:  DEPAKOTE SPRINKLE Take 500 mg by mouth 3 (three) times daily.   levothyroxine 200 MCG tablet Commonly known as:  SYNTHROID, LEVOTHROID Take 200 mcg by mouth daily before breakfast.   METOPROLOL SUCCINATE ER PO 12.5 mg by PEG Tube route 2 (two) times daily. Metoprolol tartrate 6.25 mg/5 ml suspension   pantoprazole sodium 40 mg/20 mL Pack Commonly known as:  PROTONIX 40 mg by PEG Tube route daily at 12 noon.   PARoxetine 20 MG tablet Commonly known as:  PAXIL Take 20 mg by mouth daily.   polyethylene glycol packet Commonly known as:  MIRALAX / GLYCOLAX 17 g by PEG Tube route 2 (two) times daily. Mix 17gm  In 4-8 ounces of liquid   polyvinyl alcohol 1.4 % ophthalmic solution Commonly known as:  LIQUIFILM TEARS Place 1 drop into both eyes 4 (four) times daily. 9am, 2pm, 6pm, 9pm   predniSONE 10 MG tablet Commonly known as:  DELTASONE Take with Breakfast......20 mg daily for 4 days, then 10 mg daily for 4 days, then 5 mg daily after that What changed:    medication strength  how much to take  how to take this  when to take this  additional instructions   risperiDONE 2 MG disintegrating tablet Commonly known as:  RISPERDAL M-TABS Place 2 mg under the tongue 2 (two) times daily.   sennosides 8.8 MG/5ML syrup Commonly known as:  SENOKOT Take 15 mLs by mouth 2 (two) times daily.   sulfamethoxazole-trimethoprim 200-40 MG/5ML suspension Commonly known as:  BACTRIM,SEPTRA Take 20 mLs by mouth 2 (two) times daily for 14 days. Bactrim DS 800/160 mg (20 mls) Twice a day via PEG x 14 days   traZODone 50 MG tablet Commonly known as:  DESYREL Take 50 mg by mouth 2 (two) times daily.   VITAL 1.5 CAL PO Take 237 mLs by  mouth as needed (For less than 50% food consumption.).       Major procedures and Radiology Reports - PLEASE review detailed and final reports for all details, in brief -  Ct Head Wo Contrast  Result Date: 01/28/2018 CLINICAL  DATA:  Fall out of bed, history of anoxic brain injury status post cardiac arrest EXAM: CT HEAD WITHOUT CONTRAST TECHNIQUE: Contiguous axial images were obtained from the base of the skull through the vertex without intravenous contrast. COMPARISON:  10/02/2017 FINDINGS: Motion degraded images. Brain: No evidence of acute infarction, hemorrhage, hydrocephalus, extra-axial collection or mass lesion/mass effect. Mild cortical and central atrophy. Subcortical white matter and periventricular small vessel ischemic changes. Vascular: No hyperdense vessel or unexpected calcification. Skull: Normal. Negative for fracture or focal lesion. Sinuses/Orbits: The visualized paranasal sinuses are essentially clear. The mastoid air cells are unopacified. Other: None. IMPRESSION: No evidence of acute intracranial abnormality. Atrophy with small vessel ischemic changes. Electronically Signed   By: Charline BillsSriyesh  Krishnan M.D.   On: 01/28/2018 08:35   Ct Abdomen Pelvis W Contrast  Result Date: 01/25/2018 CLINICAL DATA:  Abscess near G-tube insertion site. Redness/swelling. History of right nephrectomy, peg tube placement, AKI EXAM: CT ABDOMEN AND PELVIS WITH CONTRAST TECHNIQUE: Multidetector CT imaging of the abdomen and pelvis was performed using the standard protocol following bolus administration of intravenous contrast. CONTRAST:  100mL ISOVUE-300 IOPAMIDOL (ISOVUE-300) INJECTION 61% COMPARISON:  CT abdomen dated 10/04/2017. FINDINGS: Lower chest: No acute abnormality. Hepatobiliary: No focal liver abnormality is seen. Status post cholecystectomy. No biliary dilatation. Pancreas: Unremarkable. No pancreatic ductal dilatation or surrounding inflammatory changes. Spleen: Normal in size without focal abnormality. Adrenals/Urinary Tract: Right kidney is surgically absent. Adrenal glands appear normal. Stable hypodense mass within the upper pole of the left kidney, again measuring 2.3 cm, described as benign on earlier MRI abdomen. No  left-sided hydronephrosis or perinephric fluid. No ureteral or bladder calculi. Stomach/Bowel: No dilated large or small bowel loops. No bowel wall thickening or evidence of bowel wall inflammation seen. Peg tube appears appropriately positioned in the stomach. Vascular/Lymphatic: Mild aortic atherosclerosis. No enlarged lymph nodes seen. Reproductive: Prostate is unremarkable. Other: Lobular abscess collection within the subcutaneous soft tissues of the anterior abdominal wall, supraumbilical, measuring 8.7 x 6 x 6.1 cm (transverse by AP by craniocaudal dimensions). The abscess collection involves the underlying anterior abdominal wall rectus musculature but does not extend into the intraperitoneal abdomen. No intraperitoneal free fluid or abscess collection. No free intraperitoneal air. 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 No acute or suspicious osseous finding. IMPRESSION: 1. Anterior abdominal wall abscess collection, supraumbilical near the midline, measuring 8.7 x 6 x 6.1 cm, centered within the subcutaneous soft tissues but extending into the underlying anterior abdominal wall rectus musculature. No intraperitoneal extension. 2. The abscess collection within the anterior abdominal wall is located approximately 3 cm to the right of the PEG tube insertion site. The abscess does not appear to involve the PEG tube insertion site. 3. Peg tube appears adequately positioned in the stomach. 4. Right kidney is surgically absent. 5. Aortic atherosclerosis. 6. 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 Electronically Signed   By: Bary RichardStan  Maynard M.D.   On: 01/25/2018 20:18   Dg Pelvis Portable  Result Date: 01/28/2018 CLINICAL DATA:  Fall.  Bilateral hip pain. EXAM: PORTABLE PELVIS 1-2 VIEWS COMPARISON:  None. FINDINGS: There is  no  evidence of pelvic fracture or diastasis. No pelvic bone lesions are seen. IMPRESSION: Negative. Electronically Signed   By: Kennith Center M.D.   On: 01/28/2018 10:22   Ir Replace G-tube Simple Wo Fluoro  Result Date: 01/28/2018 INDICATION: 52 year old male with a history of percutaneous gastrostomy tube, and accidental removal EXAM: BEDSIDE EXCHANGE OF PERCUTANEOUS GASTROSTOMY MEDICATIONS: None ANESTHESIA/SEDATION: None CONTRAST:  None FLUOROSCOPY TIME:  None COMPLICATIONS: None PROCEDURE: Informed written consent was obtained from the patient after a thorough discussion of the procedural risks, benefits and alternatives. All questions were addressed. A timeout was performed prior to the initiation of the procedure. Bedside exchange of 24 French balloon retention gastrostomy was performed. Saline was used to inflate the retention balloon. Patient tolerated the procedure well and remained hemodynamically stable throughout. No complications were encountered and no significant blood loss. IMPRESSION: Status post bedside exchange of balloon retention percutaneous gastrostomy with placement of a 24 French G-tube. Electronically Signed   By: Gilmer Mor D.O.   On: 01/28/2018 16:16    Micro Results   Recent Results (from the past 240 hour(s))  Culture, blood (routine x 2)     Status: Abnormal   Collection Time: 01/25/18  6:18 PM  Result Value Ref Range Status   Specimen Description BLOOD LEFT HAND  Final   Special Requests IN PEDIATRIC BOTTLE Blood Culture adequate volume  Final   Culture  Setup Time   Final    GRAM POSITIVE COCCI IN PEDIATRIC BOTTLE CRITICAL VALUE NOTED.  VALUE IS CONSISTENT WITH PREVIOUSLY REPORTED AND CALLED VALUE.    Culture (A)  Final    STAPHYLOCOCCUS HOMINIS CORRECTED ON 02/03 AT 1610: PREVIOUSLY REPORTED AS STAPHYLOCOCCUS SPECIES (COAGULASE NEGATIVE) SUSCEPTIBILITIES PERFORMED ON PREVIOUS CULTURE WITHIN THE LAST 5 DAYS. Performed at [facility], [address]  [place], [place] [address]    Report Status 01/30/2018 FINAL  Final  Culture, blood (routine x 2)     Status: Abnormal (Preliminary result)   Collection Time: 01/25/18  6:18 PM  Result Value Ref Range Status   Specimen Description BLOOD RIGHT HAND  Final   Special Requests IN PEDIATRIC BOTTLE Blood Culture adequate volume  Final   Culture  Setup Time   Final    GRAM POSITIVE COCCI IN PEDIATRIC BOTTLE CRITICAL RESULT CALLED TO, READ BACK BY AND VERIFIED WITH: M. Lilliston Pharm.D. 17:10 01/26/18 ([email or web address])    Culture (A)  Final    STAPHYLOCOCCUS HOMINIS STAPHYLOCOCCUS CAPITIS SUSCEPTIBILITIES TO FOLLOW Performed at [facility], [address], [place], [place] [address]    Report Status PENDING  Incomplete   Organism ID, Bacteria STAPHYLOCOCCUS HOMINIS  Final      Susceptibility   Staphylococcus hominis - MIC*    CIPROFLOXACIN <=0.5 SENSITIVE Sensitive     ERYTHROMYCIN 0.5 SENSITIVE Sensitive     GENTAMICIN <=0.5 SENSITIVE Sensitive     OXACILLIN <=0.25 SENSITIVE Sensitive     TETRACYCLINE >=16 RESISTANT Resistant     VANCOMYCIN 1 SENSITIVE Sensitive     TRIMETH/SULFA <=10 SENSITIVE Sensitive     CLINDAMYCIN <=0.25 SENSITIVE Sensitive     RIFAMPIN <=0.5 SENSITIVE Sensitive     Inducible Clindamycin NEGATIVE Sensitive     * STAPHYLOCOCCUS HOMINIS  Blood Culture ID Panel (Reflexed)     Status: Abnormal   Collection Time: 01/25/18  6:18 PM  Result Value Ref Range Status   Enterococcus species NOT DETECTED NOT DETECTED Final   Listeria monocytogenes NOT DETECTED NOT DETECTED Final  Staphylococcus species DETECTED (A) NOT DETECTED Final    Comment: Methicillin (oxacillin) resistant coagulase negative staphylococcus. Possible blood culture contaminant (unless isolated from more than one blood culture draw or clinical case suggests pathogenicity). No antibiotic treatment is indicated for blood  culture contaminants. CRITICAL RESULT CALLED TO, READ BACK BY AND VERIFIED WITH: M.  Lilliston Pharm.D. 17:10 01/26/18 ([email or web address])    Staphylococcus aureus NOT DETECTED NOT DETECTED Final   Methicillin resistance DETECTED (A) NOT DETECTED Final    Comment: CRITICAL RESULT CALLED TO, READ BACK BY AND VERIFIED WITH: M. Lilliston Pharm.D. 17:10 01/26/18 ([email or web address])    Streptococcus species NOT DETECTED NOT DETECTED Final   Streptococcus agalactiae NOT DETECTED NOT DETECTED Final   Streptococcus pneumoniae NOT DETECTED NOT DETECTED Final   Streptococcus pyogenes NOT DETECTED NOT DETECTED Final   Acinetobacter baumannii NOT DETECTED NOT DETECTED Final   Enterobacteriaceae species NOT DETECTED NOT DETECTED Final   Enterobacter cloacae complex NOT DETECTED NOT DETECTED Final   Escherichia coli NOT DETECTED NOT DETECTED Final   Klebsiella oxytoca NOT DETECTED NOT DETECTED Final   Klebsiella pneumoniae NOT DETECTED NOT DETECTED Final   Proteus species NOT DETECTED NOT DETECTED Final   Serratia marcescens NOT DETECTED NOT DETECTED Final   Haemophilus influenzae NOT DETECTED NOT DETECTED Final   Neisseria meningitidis NOT DETECTED NOT DETECTED Final   Pseudomonas aeruginosa NOT DETECTED NOT DETECTED Final   Candida albicans NOT DETECTED NOT DETECTED Final   Candida glabrata NOT DETECTED NOT DETECTED Final   Candida krusei NOT DETECTED NOT DETECTED Final   Candida parapsilosis NOT DETECTED NOT DETECTED Final   Candida tropicalis NOT DETECTED NOT DETECTED Final  Surgical PCR screen     Status: None   Collection Time: 01/26/18  1:20 PM  Result Value Ref Range Status   MRSA, PCR NEGATIVE NEGATIVE Final   Staphylococcus aureus NEGATIVE NEGATIVE Final    Comment: (NOTE) The Xpert SA Assay (FDA approved for NASAL specimens in patients 36 years of age and older), is one component of a comprehensive surveillance program. It is not intended to diagnose infection nor to guide or monitor treatment.   Aerobic/Anaerobic Culture (surgical/deep wound)     Status: None (Preliminary result)    Collection Time: 01/26/18  6:23 PM  Result Value Ref Range Status   Specimen Description   Final    ABSCESS ABDOMEN Performed at [facility], [address], [place], [place] [address]    Special Requests   Final    NONE Performed at [facility], [address] [place], [place] [address]    Gram Stain   Final    RARE WBC PRESENT, PREDOMINANTLY PMN MODERATE GRAM POSITIVE COCCI IN PAIRS IN CLUSTERS Performed at [facility], [address] [place], [place] [address]    Culture   Final    ABUNDANT METHICILLIN RESISTANT STAPHYLOCOCCUS AUREUS NO ANAEROBES ISOLATED; CULTURE IN PROGRESS FOR 5 DAYS    Report Status PENDING  Incomplete   Organism ID, Bacteria METHICILLIN RESISTANT STAPHYLOCOCCUS AUREUS  Final      Susceptibility   Methicillin resistant staphylococcus aureus - MIC*    CIPROFLOXACIN >=8 RESISTANT Resistant     ERYTHROMYCIN >=8 RESISTANT Resistant     GENTAMICIN <=0.5 SENSITIVE Sensitive     OXACILLIN >=4 RESISTANT Resistant     TETRACYCLINE <=1 SENSITIVE Sensitive     VANCOMYCIN <=0.5 SENSITIVE Sensitive     TRIMETH/SULFA <=10 SENSITIVE Sensitive     RIFAMPIN <=0.5 SENSITIVE Sensitive  Inducible Clindamycin NEGATIVE Sensitive     * ABUNDANT METHICILLIN RESISTANT STAPHYLOCOCCUS AUREUS  Culture, blood (Routine X 2) w Reflex to ID Panel     Status: None (Preliminary result)   Collection Time: 01/27/18  5:51 PM  Result Value Ref Range Status   Specimen Description   Final    BLOOD LEFT ANTECUBITAL Performed at [facility], [address] [place], [place] [address]    Special Requests   Final    IN PEDIATRIC BOTTLE Blood Culture adequate volume Performed at [facility], [address], [place], [place] [address]    Culture   Final    NO GROWTH 2 DAYS Performed at [facility], [address], [place], [place] [address]    Report Status PENDING  Incomplete  Culture, blood  (Routine X 2) w Reflex to ID Panel     Status: None (Preliminary result)   Collection Time: 01/27/18  7:08 PM  Result Value Ref Range Status   Specimen Description   Final    BLOOD RIGHT HAND Performed at [facility], [address], [place], [place] [address]    Special Requests   Final    IN PEDIATRIC BOTTLE Blood Culture adequate volume Performed at [facility], [address], [place], [place] [address]    Culture   Final    NO GROWTH 2 DAYS Performed at [facility], [address] [place], [place] [address]    Report Status PENDING  Incomplete    Today   Subjective    Karsen Nakanishi today has no new complaints, tolerating PEG tube feeding well          Patient has been seen and examined prior to discharge   Objective   Blood pressure 114/81, pulse 70, temperature 99.2 F (37.3 C), temperature source Oral, resp. rate 18, weight 127.5 kg (281 lb), SpO2 95 %.   Intake/Output Summary (Last 24 hours) at 01/30/2018 1514 Last data filed at 01/30/2018 1000 Gross per 24 hour  Intake 1705 ml  Output -  Net 1705 ml   Exam  Gen:- Awake Alert, able to answer questions, follows commands HEENT:- [place].AT, No sclera icterus Neck-Supple Neck,No JVD,.  Lungs-  CTAB  CV- S1, S2 normal Abd-  +ve B.Sounds, Abd Soft, scars from prior abdominal surgeries noted, PEG tube site intact (replaced 01/28/18), postop wound areas with packing, less swelling, less erythema and less drainage Neuro-generalized weakness and neuromuscular deficits at baseline, patient is able to move all 4 extremities, Psych-cognitive deficits consistent with anoxic brain injury with history of behavioral disturbance    Data Review   CBC w Diff:  Lab Results  Component Value Date   WBC 6.6 01/29/2018   HGB 11.1 (L) 01/29/2018   HCT 35.0 (L) 01/29/2018   PLT 189 01/29/2018   LYMPHOPCT 23 01/25/2018   MONOPCT 12 01/25/2018   EOSPCT 2 01/25/2018   BASOPCT 0  01/25/2018    CMP:  Lab Results  Component Value Date   NA 143 01/29/2018   K 3.7 01/29/2018   CL 109 01/29/2018   CO2 29 01/29/2018   BUN 14 01/29/2018   CREATININE 0.65 01/30/2018   PROT 7.3 01/25/2018   ALBUMIN 3.0 (L) 01/25/2018   BILITOT 0.1 (L) 01/25/2018   ALKPHOS 71 01/25/2018   AST 15 01/25/2018   ALT 9 (L) 01/25/2018   Total Discharge time is about 33 minutes  Shon Hale M.D on  01/30/2018 at 3:14 PM  [facility]   Office  [phone]  Voice Recognition Reubin Milan dictation system was used to create this note, attempts have been made to correct errors. Please contact the author with questions and/or clarifications.

## 2018-01-30 NOTE — Progress Notes (Signed)
CSW following to assist with disposition as pt admitted [facility]. Left messages with admissions re: coordinating pt's return at DC. Spoke with reception at facility who transferred CSW to facility Interior and spatial designerdirector- CSW left voicemail.  Ilean SkillMeghan Ranya Fiddler, MSW, LCSW Clinical Social Work 01/30/2018 [phone] weekend coverage for [phone]

## 2018-01-30 NOTE — Progress Notes (Signed)
Pharmacy Antibiotic Note  [name] is a 52 y.o. male admitted on 01/25/2018 with cellulitis, abdominal wall abscess with removal of infected mesh, 01/26/18.  Pharmacy has been consulted for Vancomycin dosing.  Plan:  Decrease to Vancomycin 750mg  IV q12hr, next dose at 1800  Recheck vancomycin peak/trough if continued.  Goal AUC = 400 - 500 for all indications, except meningitis (goal AUC > 500 and Cmin 15-20 mcg/mL)   Weight: 281 lb (127.5 kg)  Temp (24hrs), Avg:97.9 F (36.6 C), Min:97.6 F (36.4 C), Max:98.1 F (36.7 C)  Recent Labs  Lab 01/25/18 1755 01/25/18 1823 01/26/18 0558 01/28/18 0457 01/29/18 0444 01/30/18 0204 01/30/18 0434  WBC 8.3  --  8.6 8.4 6.6  --   --   CREATININE 1.05  --  0.87 0.67 0.68  --   --   LATICACIDVEN  --  1.78  --   --   --   --   --   VANCOPEAK  --   --   --   --   --  50* 38    Estimated Creatinine Clearance: 150.3 mL/min (by C-G formula based on SCr of 0.68 mg/dL).    Allergies  Allergen Reactions  . Ativan [Lorazepam] Other (See Comments)    Unknown (not listed [facility]'s [facility])  . Latex Dermatitis  . Morphine And Related Other (See Comments)    [facility]  . Penicillins Other (See Comments)    Patient tolerated cephalosporins in past.  Allergy listed [facility] Has patient had a PCN reaction causing immediate rash, facial/tongue/throat swelling, SOB or lightheadedness with hypotension: unknown Has patient had a PCN reaction causing severe rash involving mucus membranes or skin necrosis: unknown Has patient had a PCN reaction that required hospitalization: no Has patient had a PCN reaction occurring within the last 10 years: yes If all of the above answe  . Pyridium [Phenazopyridine Hcl] Other (See Comments)    [facility]  . Soy Allergy Other (See Comments)    [facility]  [facility]    Antimicrobials this admission:  Vancomycin 01/25/2018 >> Meropenem 01/25/2018 >>2/1  Dose adjustments this admission:  2/3 0200 Vanc Peak = 50 (drawn early and unreliable since only 30 minutes after infusion.) 2/3 0430 Vanc Peak = 38 (drawn 2.5 hours after end of infusion) 2/4 1115 Vanc trough = 22, calculated AUC 758, t1/2 8.6, SCr 0.65.  Microbiology results:  1/29 BCx: 2/2 peds bottles CoNS; BCID= meth resistant, but C&S = susceptible >> would trust BCID over e-test suggesting presence of resistance gene but non-dominant strain. 1/30 Abscess Cx: MRSA 1/30 MRSA PCR: neg/neg 1/31 BCx: NGTD   Thank you for allowing pharmacy to be a part of this patient's care.  Lynann Beaverhristine Burhan Barham PharmD, BCPS Pager [phone] 01/30/2018, 8:33 AM

## 2018-01-31 LAB — CULTURE, BLOOD (ROUTINE X 2): Special Requests: ADEQUATE

## 2018-01-31 LAB — AEROBIC/ANAEROBIC CULTURE W GRAM STAIN (SURGICAL/DEEP WOUND)

## 2018-01-31 LAB — AEROBIC/ANAEROBIC CULTURE (SURGICAL/DEEP WOUND)

## 2018-02-01 LAB — CULTURE, BLOOD (ROUTINE X 2)
CULTURE: NO GROWTH
CULTURE: NO GROWTH
SPECIAL REQUESTS: ADEQUATE
SPECIAL REQUESTS: ADEQUATE

## 2018-02-20 ENCOUNTER — Encounter (HOSPITAL_COMMUNITY): Payer: Self-pay | Admitting: *Deleted

## 2018-02-20 ENCOUNTER — Emergency Department (HOSPITAL_COMMUNITY): Payer: Medicare Other

## 2018-02-20 ENCOUNTER — Emergency Department (HOSPITAL_COMMUNITY)
Admission: EM | Admit: 2018-02-20 | Discharge: 2018-02-20 | Disposition: A | Discharge status: Home / Self Care | Payer: Medicare Other | Attending: Emergency Medicine | Admitting: Emergency Medicine

## 2018-02-20 DIAGNOSIS — Z7982 Long term (current) use of aspirin: Secondary | ICD-10-CM | POA: Diagnosis not present

## 2018-02-20 DIAGNOSIS — Z431 Encounter for attention to gastrostomy: Secondary | ICD-10-CM | POA: Diagnosis not present

## 2018-02-20 DIAGNOSIS — Z79899 Other long term (current) drug therapy: Secondary | ICD-10-CM | POA: Diagnosis not present

## 2018-02-20 DIAGNOSIS — T85528A Displacement of other gastrointestinal prosthetic devices, implants and grafts, initial encounter: Secondary | ICD-10-CM

## 2018-02-20 DIAGNOSIS — Z91040 Latex allergy status: Secondary | ICD-10-CM | POA: Insufficient documentation

## 2018-02-20 DIAGNOSIS — E039 Hypothyroidism, unspecified: Secondary | ICD-10-CM | POA: Insufficient documentation

## 2018-02-20 DIAGNOSIS — Z434 Encounter for attention to other artificial openings of digestive tract: Secondary | ICD-10-CM

## 2018-02-20 MED ORDER — IOPAMIDOL (ISOVUE-300) INJECTION 61%
INTRAVENOUS | Status: AC
  Filled 2018-02-20: qty 60

## 2018-02-20 NOTE — ED Provider Notes (Signed)
[facility]-EMERGENCY DEPT Provider Note   CSN: [id number] Arrival date & time: 02/20/18  1751     History   Chief Complaint Chief Complaint  Patient presents with  . Pulled Feeding Tube Out    HPI [name] is a 52 y.o. male.  G tube pulled out today accidentally.    The history is provided by the patient, medical records and the EMS personnel. No language interpreter was used.  Wound Check  This is a chronic problem. The current episode started more than 1 week ago. The problem occurs constantly. The problem has not changed since onset.Pertinent negatives include no chest pain, no abdominal pain, no headaches and no shortness of breath. Nothing aggravates the symptoms. Nothing relieves the symptoms. He has tried nothing for the symptoms. The treatment provided no relief.    Past Medical History:  Diagnosis Date  . Addison disease (HCC)   . Anoxic brain injury (HCC)   . Anxiety   . Aspiration pneumonia (HCC)   . Atrial fibrillation (HCC)   . Chronic constipation 11/23/2011  . Dysphagia   . Encephalopathy   . Glaucoma   . Glucocorticoid deficiency (HCC)   . History of recurrent UTIs   . Hyperlipemia   . Hypothyroidism   . Mental disorder   . Myocardial infarction (HCC)   . Pneumoperitoneum of unknown etiology 12/21/2011  . Quadriplegia (HCC)   . Reflux   . Seizure disorder [facility])     Patient Active Problem List   Diagnosis Date Noted  . PEG Gastrostomy tube in place (HCC) 01/26/2018  . 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 01/26/2018 01/26/2018  . Bedridden 01/26/2018  . Quadriplegia (HCC)   . Abdominal wall abscess 01/25/2018  . Fracture of humerus anatomical neck, right, closed, initial encounter 10/03/2017  . AKI (acute kidney injury) (HCC) 10/03/2017  . UTI (urinary tract infection) 10/03/2017  . Agitation 07/03/2014  . Cardiomyopathy, ischemic 06/18/2014  . 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 Acute psychosis  (HCC) 04/19/2014  . Unspecified hypothyroidism 05/16/2013  . Constipation 05/16/2013  . Anemia of other chronic disease 05/16/2013  . Mental disorder 01/03/2013  . Addisons disease (HCC) 01/03/2013  . PEG (percutaneous endoscopic gastrostomy) adjustment/replacement/removal (HCC) 12/21/2011  . Hypokalemia 11/25/2011  . Thrombocytopenia (HCC) 11/25/2011  . FTT (failure to thrive) in adult 11/25/2011  . Grand mal seizure (HCC) 11/23/2011  . Depression (emotion) 11/23/2011  . History of atrial fibrillation 11/23/2011  . Other specified hemorrhagic conditions (HCC) 11/23/2011  . Glaucoma 11/23/2011  . Hypoglycemia 11/23/2011  . Chronic constipation 11/23/2011  . Anoxic brain injury (HCC) 11/12/2011  . Protein calorie malnutrition (HCC) 11/12/2011  . Hyperchloremia 11/12/2011  . Hyperthyroidism 11/12/2011  . Acute lower UTI 11/12/2011  . Dysphagia     Past Surgical History:  Procedure Laterality Date  . DEBRIDEMENT OF ABDOMINAL WALL ABSCESS  01/26/2018  . HERNIA MESH REMOVAL  01/26/2018   Infected mesh - removed w I&D abd wall abscess  . IR GENERIC HISTORICAL  10/20/2016   IR REPLC GASTRO/COLONIC TUBE PERCUT W/FLUORO 10/20/2016 Oley Balm, MD [facility]-INTERV RAD  . IR REPLACE G-TUBE SIMPLE WO FLUORO  01/28/2018  . IR REPLC GASTRO/COLONIC TUBE PERCUT W/FLUORO  04/21/2017  . IR REPLC GASTRO/COLONIC TUBE PERCUT W/FLUORO  07/22/2017  . IR REPLC GASTRO/COLONIC TUBE PERCUT W/FLUORO  11/24/2017  . IRRIGATION AND DEBRIDEMENT ABSCESS N/A 01/26/2018   Procedure: IRRIGATION AND DEBRIDEMENT ABDOMINAL WALL ABSCESS WITH REMOVAL OF INFECTED MESH;  Surgeon: Karie Soda, MD;  Location: [facility] ORS;  Service: General;  Laterality: N/A;  . NEPHRECTOMY  unknown  . PEG TUBE PLACEMENT         Home Medications    Prior to Admission medications   Medication Sig Start Date End Date Taking? Authorizing Provider  acetaminophen (TYLENOL) 325 MG tablet Place 650 mg into feeding tube every 4 (four) hours as  needed for mild pain.    [provider]  aspirin EC 81 MG tablet Take 1 tablet (81 mg total) by mouth daily. With food 01/30/18 01/30/19  Shon Hale, MD  divalproex (DEPAKOTE SPRINKLE) 125 MG capsule Take 500 mg by mouth 3 (three) times daily.  07/18/13   [provider]  levothyroxine (SYNTHROID, LEVOTHROID) 200 MCG tablet Take 200 mcg by mouth daily before breakfast.    [provider]  METOPROLOL SUCCINATE ER PO 12.5 mg by PEG Tube route 2 (two) times daily. Metoprolol tartrate 6.25 mg/5 ml suspension    [provider]  Multiple Vitamins-Minerals (CERTAVITE/ANTIOXIDANTS) LIQD 15 mLs by PEG Tube route daily at 12 noon.     [provider]  Nutritional Supplements (VITAL 1.5 CAL PO) Take 237 mLs by mouth as needed (For less than 50% food consumption.).    [provider]  pantoprazole sodium (PROTONIX) 40 mg/20 mL PACK 40 mg by PEG Tube route daily at 12 noon.  11/30/11   [name], MD  PARoxetine (PAXIL) 20 MG tablet Take 20 mg by mouth daily. 06/03/17   [provider]  polyethylene glycol (MIRALAX / GLYCOLAX) packet 17 g by PEG Tube route 2 (two) times daily. Mix 17gm  In 4-8 ounces of liquid    [provider]  polyvinyl alcohol (LIQUIFILM TEARS) 1.4 % ophthalmic solution Place 1 drop into both eyes 4 (four) times daily. 9am, 2pm, 6pm, 9pm    [provider]  predniSONE (DELTASONE) 10 MG tablet Take with Breakfast......20 mg daily for 4 days, then 10 mg daily for 4 days, then 5 mg daily after that 01/30/18   Shon Hale, MD  risperiDONE (RISPERDAL M-TABS) 2 MG disintegrating tablet Place 2 mg under the tongue 2 (two) times daily. 05/08/17   [provider]  sennosides (SENOKOT) 8.8 MG/5ML syrup Take 15 mLs by mouth 2 (two) times daily.    [provider]  traZODone (DESYREL) 50 MG tablet Take 50 mg by mouth 2 (two) times daily.  08/02/13   [provider]    Family History No  family history on file.  Social History Social History   Tobacco Use  . Smoking status: Never Smoker  . Smokeless tobacco: Never Used  Substance Use Topics  . Alcohol use: No  . Drug use: No     Allergies   Ativan [lorazepam]; Latex; Morphine and related; Penicillins; Pyridium [phenazopyridine hcl]; and Soy allergy   Review of Systems Review of Systems  Unable to perform ROS: Patient nonverbal  Constitutional: Negative for chills, fatigue and fever.  HENT: Negative for congestion.   Respiratory: Negative for chest tightness and shortness of breath.   Cardiovascular: Negative for chest pain.  Gastrointestinal: Negative for abdominal distention, abdominal pain, diarrhea, nausea and vomiting.  Genitourinary: Negative for flank pain.  Musculoskeletal: Negative for back pain, neck pain and neck stiffness.  Skin: Positive for wound. Negative for rash.  Neurological: Negative for headaches.  Psychiatric/Behavioral: Negative for agitation.  All other systems reviewed and are negative.    Physical Exam Updated Vital Signs BP 114/79 (BP Location:  Left Arm)   Pulse 66   Temp 98.8 F (37.1 C) (Oral)   Resp 20   SpO2 94%   Physical Exam  Constitutional: He appears well-developed and well-nourished. No distress.  HENT:  Head: Normocephalic.  Mouth/Throat: Oropharynx is clear and moist. No oropharyngeal exudate.  Eyes: EOM are normal. Pupils are equal, round, and reactive to light.  Cardiovascular: Normal rate.  No murmur heard. Pulmonary/Chest: No respiratory distress. He has no wheezes. He has no rales. He exhibits no tenderness.  Abdominal: Bowel sounds are normal. There is no tenderness. There is no rigidity, no rebound and no guarding.    Musculoskeletal: He exhibits no tenderness.  Neurological: He is alert.  Skin: Capillary refill takes less than 2 seconds. He is not diaphoretic. No erythema. No pallor.  Nursing note and vitals reviewed.    ED Treatments /  Results  Labs (all labs ordered are listed, but only abnormal results are displayed) Labs Reviewed - No data to display  EKG  EKG Interpretation None       Radiology Dg Abdomen Peg Tube Location  Result Date: 02/20/2018 CLINICAL DATA:  Post PEG tube placement EXAM: ABDOMEN - 1 VIEW COMPARISON:  01/28/2018 FINDINGS: Gastrostomy tube projects over the left upper quadrant. Contrast material injected through the gastrostomy tube fills the fundus of the stomach. Nonobstructive bowel gas pattern. IMPRESSION: Gastrostomy tube projects over the proximal stomach with contrast in the fundus of the stomach. No contrast extravasation. Electronically Signed   By: Charlett NoseKevin  Dover M.D.   On: 02/20/2018 20:33    Procedures Gastrostomy tube replacement Date/Time: 02/21/2018 3:14 AM Performed by: Heide Scalesegeler, Shamarie Call J, MD Authorized by: Heide Scalesegeler, Isella Slatten J, MD  Consent: Verbal consent obtained. Risks and benefits: risks, benefits and alternatives were discussed Consent given by: patient Patient understanding: patient states understanding of the procedure being performed Patient identity confirmed: verbally with patient and arm band Time out: Immediately prior to procedure a "time out" was called to verify the correct patient, procedure, equipment, support staff and site/side marked as required. Preparation: Patient was prepped and draped in the usual sterile fashion. Local anesthesia used: no  Anesthesia: Local anesthesia used: no  Sedation: Patient sedated: no  Patient tolerance: Patient tolerated the procedure well with no immediate complications Comments: 24 french g tube replaced without difficulty.     (including critical care time)  Medications Ordered in ED Medications - No data to display   Initial Impression / Assessment and Plan / ED Course  I have reviewed the triage vital signs and the nursing notes.  Pertinent labs & imaging results that were available during my care of  the patient were reviewed by me and considered in my medical decision making (see chart for details).     Oren Binetimothy Reckner is a 52 y.o. male with a complicated past medical history including G-tube dependence who presents with G-tube dislodgment.  Patient is brought in by EMS who reports that patient's G-tube fell out today.  Patient reports that the facility could not replace it and so he was sent for evaluation.  Of note, patient had a abdominal abscess infection last month which has been drained.  Patient reports no problems with this and has no abdominal pain.  He denies any nausea vomiting or other GI symptoms.  He primarily wants his G-tube replaced.  On exam, patient's G-tube site appears nonerythematous.  There is no purulence.  No tenderness present.  Normal bowel sounds.  Lungs clear.  Documentation shows that patient  has a 24-gauge G-tube that was replaced several weeks ago.  This was replaced at the bedside as described above without difficulty.  X-ray was obtained which confirmed correct placement with contrast in the stomach.  Wound was dressed and patient was felt stable for discharge home.  Patient discharged after G-tube was replaced successfully.    Final Clinical Impressions(s) / ED Diagnoses   Final diagnoses:  Gastrojejunostomy tube dislodgement [facility])    ED Discharge Orders    None      Clinical Impression: 1. Gastrojejunostomy tube dislodgement (HCC)     Disposition: Discharge  Condition: Good  I have discussed the results, Dx and Tx plan with the pt(& family if present). He/she/they expressed understanding and agree(s) with the plan. Discharge instructions discussed at great length. Strict return precautions discussed and pt &/or family have verbalized understanding of the instructions. No further questions at time of discharge.    Discharge Medication List as of 02/20/2018  9:47 PM      Follow Up: Georgann Housekeeper, MD [address] [place] [address] [phone]     [facility] [address] [id number] mc [address] [place] [address] [phone]       Kery Haltiwanger, Canary Brim, MD 02/21/18 [phone]

## 2018-02-20 NOTE — Discharge Instructions (Signed)
We replaced her G-tube today without difficulty.  Please follow-up with your primary care physician for further management.  If any symptoms change or worsen, please return to the nearest emergency department.

## 2018-02-20 NOTE — ED Notes (Signed)
[facility] Expected date: 02/20/18 Expected time: 5:50 PM Means of arrival: Ambulance Comments: Pulled feeding tube out

## 2018-02-20 NOTE — ED Notes (Signed)
[facility] called for transport.  

## 2018-02-20 NOTE — ED Triage Notes (Signed)
[facility]PTAR [facility]. Pulled out feeding tube. VSS with PTAR. Pt non-verbal.

## 2018-02-27 IMAGING — CT CT ABD-PELV W/ CM
2 of 5 series · 15 of 46 positions shown, 17 images · IV contrast (ISOVUE)
Comparison: CT abdomen dated 10/04/2017.

CLINICAL DATA: Abscess near G-tube insertion site.
Redness/swelling. History of right nephrectomy, peg tube placement,
MARCUS

EXAM:
CT ABDOMEN AND PELVIS WITH CONTRAST
TECHNIQUE: Multidetector CT imaging of the abdomen and pelvis was performed
using the standard protocol following bolus administration of
intravenous contrast.
CONTRAST:  100mL FX1GME-8FF IOPAMIDOL (FX1GME-8FF) INJECTION 61%

[Series 2: axial st · axial · 0.96mm/px · z∈[-522,-27]mm · 12 of 117 slices shown, 14 images]
[im 9/117  soft-tissue]
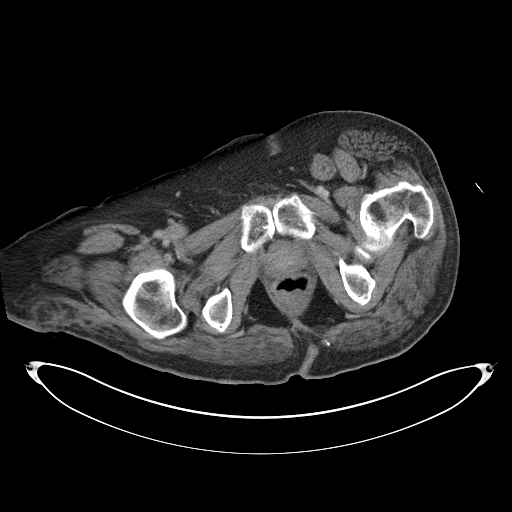
[im 9/117  bone]
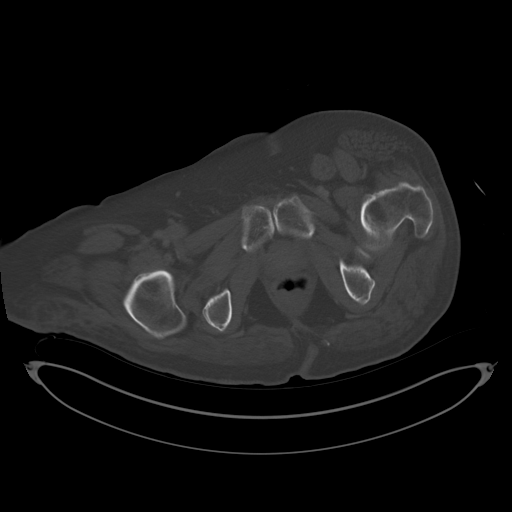
[im 17/117  soft-tissue]
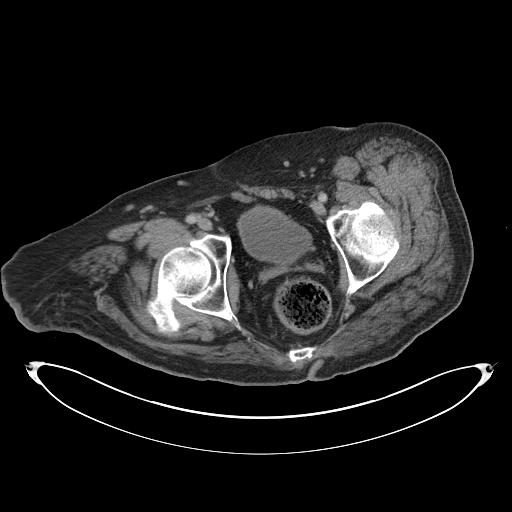
[im 25/117  soft-tissue]
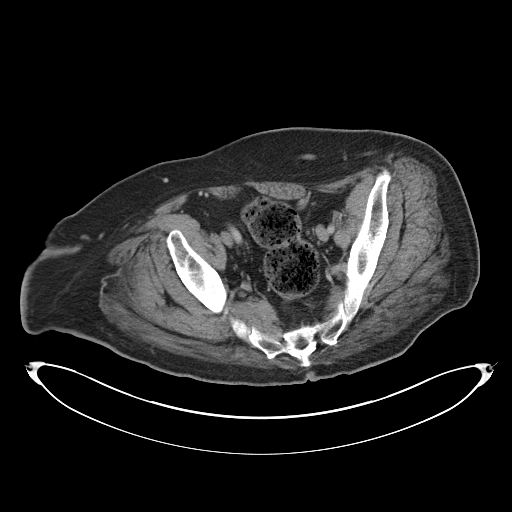
[im 34/117  soft-tissue]
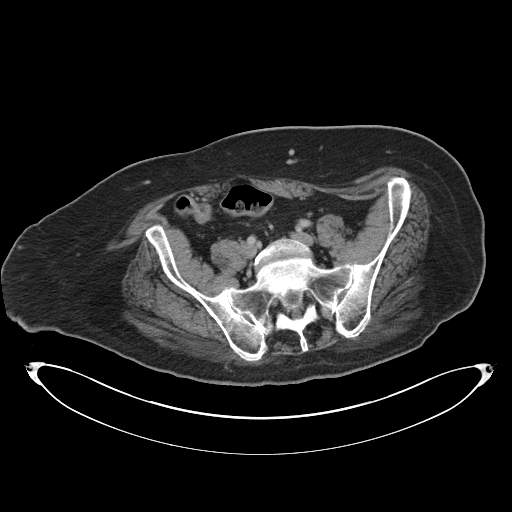
[im 42/117  soft-tissue]
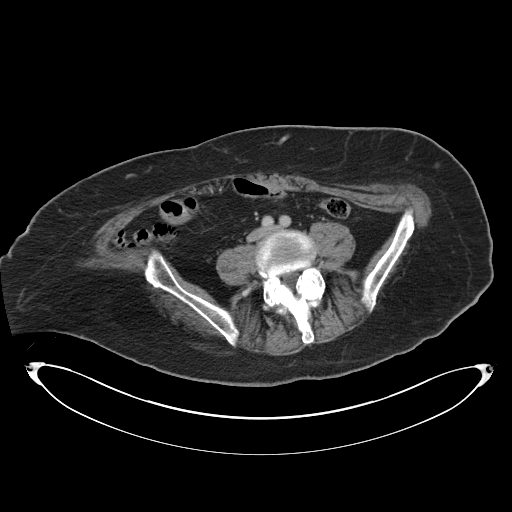
[im 50/117  soft-tissue]
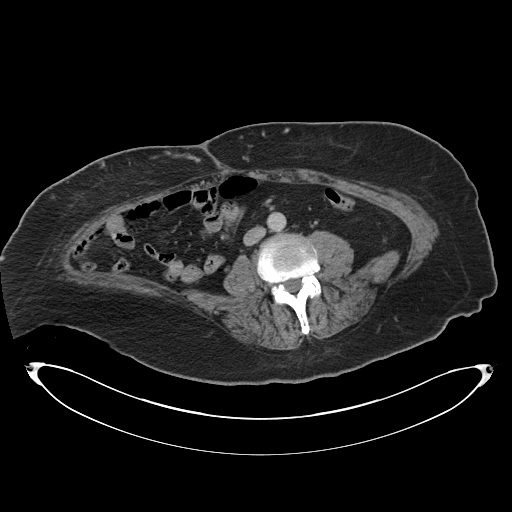
[im 67/117  soft-tissue]
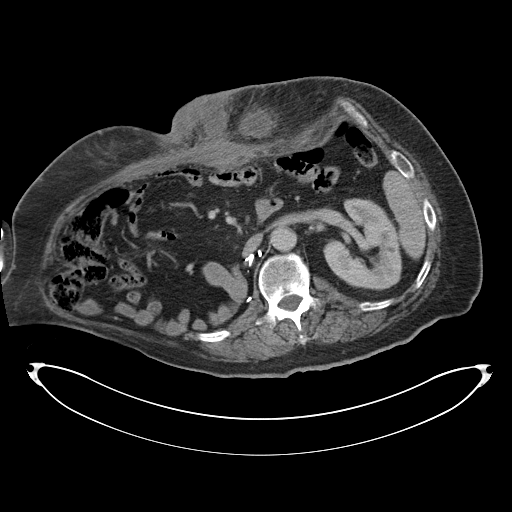
[im 75/117  soft-tissue]
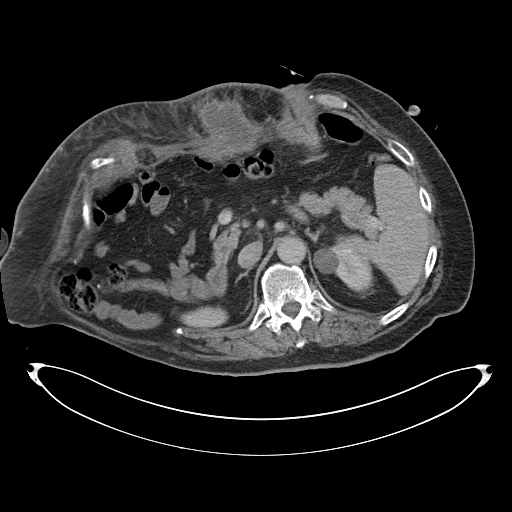
[im 83/117  soft-tissue]
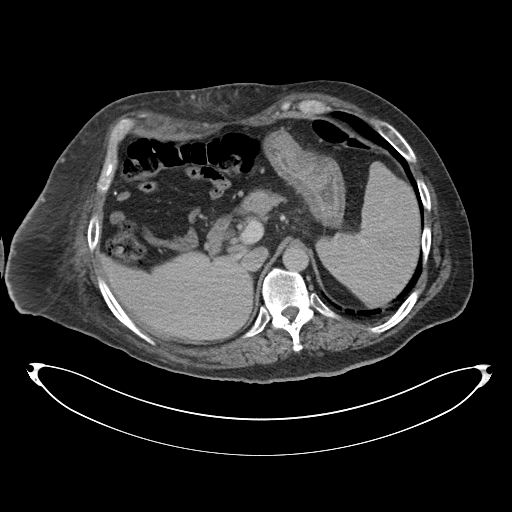
[im 83/117  bone]
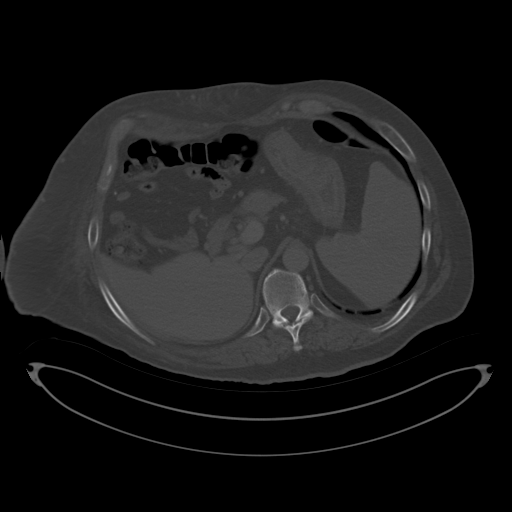
[im 92/117  soft-tissue]
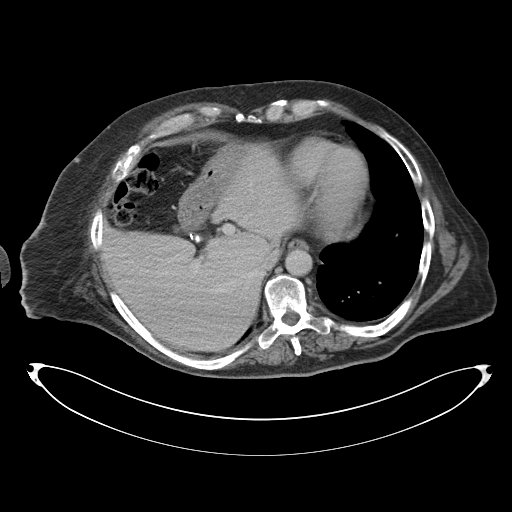
[im 100/117  soft-tissue]
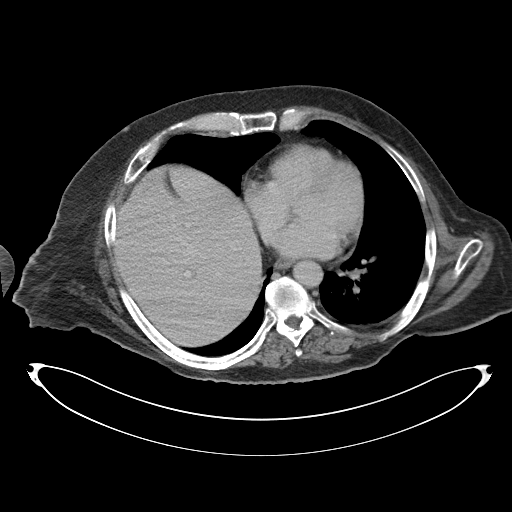
[im 108/117  soft-tissue]
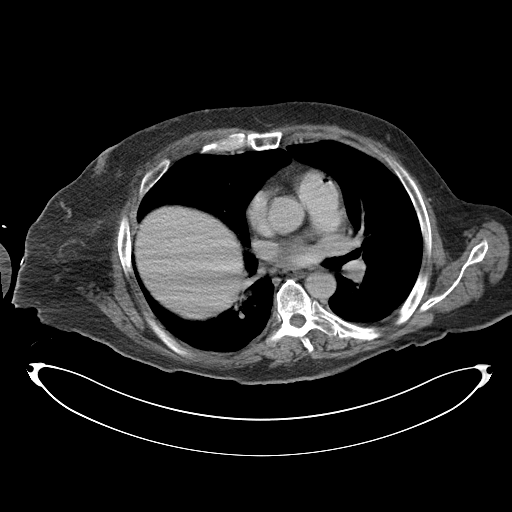

[Series 6: coronal st · coronal · 0.96mm/px · 3 of 104 slices shown]
[im 35/104  soft-tissue]
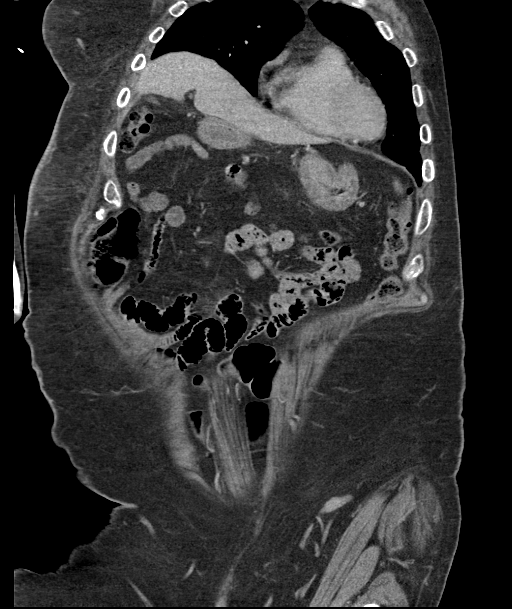
[im 46/104  soft-tissue]
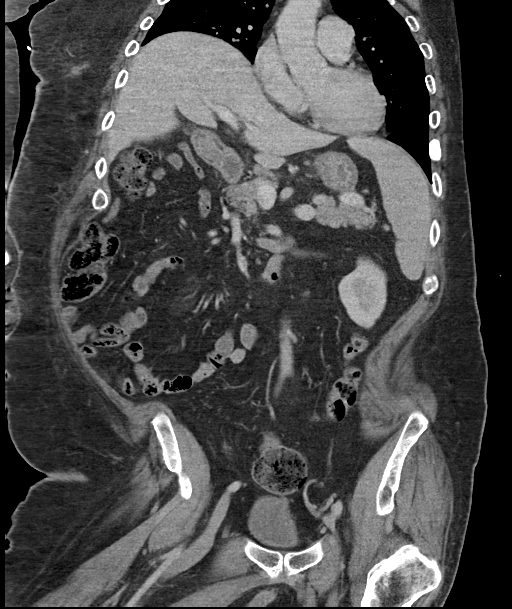
[im 58/104  soft-tissue]
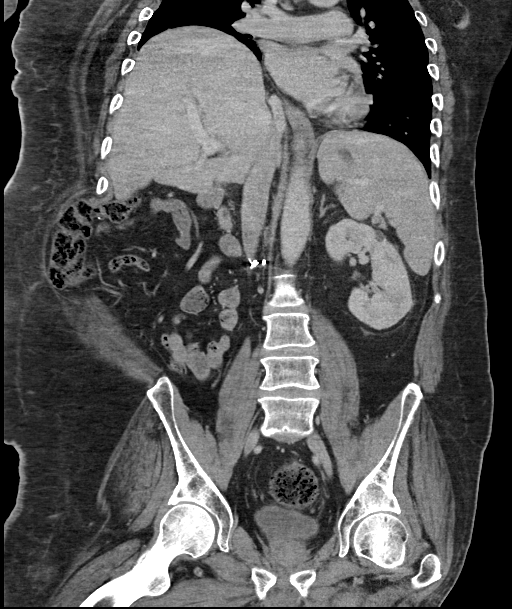

[15 of 46 positions shown; findings below may reference images not displayed]

FINDINGS: Lower chest: No acute abnormality.

Hepatobiliary: No focal liver abnormality is seen. Status post
cholecystectomy. No biliary dilatation.

Pancreas: Unremarkable. No pancreatic ductal dilatation or
surrounding inflammatory changes.

Spleen: Normal in size without focal abnormality.

Adrenals/Urinary Tract: Right kidney is surgically absent. Adrenal
glands appear normal.

Stable hypodense mass within the upper pole of the left kidney,
again measuring 2.3 cm, described as benign on earlier MRI abdomen.
No left-sided hydronephrosis or perinephric fluid. No ureteral or
bladder calculi.

Stomach/Bowel: No dilated large or small bowel loops. No bowel wall
thickening or evidence of bowel wall inflammation seen. Peg tube
appears appropriately positioned in the stomach.

Vascular/Lymphatic: Mild aortic atherosclerosis. No enlarged lymph
nodes seen.

Reproductive: Prostate is unremarkable.

Other: Lobular abscess collection within the subcutaneous soft
tissues of the anterior abdominal wall, supraumbilical, measuring
8.7 x 6 x 6.1 cm (transverse by AP by craniocaudal dimensions). The
abscess collection involves the underlying anterior abdominal wall
rectus musculature but does not extend into the intraperitoneal
abdomen.

No intraperitoneal free fluid or abscess collection. No free
intraperitoneal air.

Musculoskeletal: Chronic bilateral pars interarticularis defects at
the L5-S1 level, with associated grade 1 spondylolisthesis of L5 on
S1. No acute or suspicious osseous finding.
IMPRESSION: 1. Anterior abdominal wall abscess collection, supraumbilical near
the midline, measuring 8.7 x 6 x 6.1 cm, centered within the
subcutaneous soft tissues but extending into the underlying anterior
abdominal wall rectus musculature. No intraperitoneal extension.
2. The abscess collection within the anterior abdominal wall is
located approximately 3 cm to the right of the PEG tube insertion
site. The abscess does not appear to involve the PEG tube insertion
site.
3. Peg tube appears adequately positioned in the stomach.
4. Right kidney is surgically absent.
5. Aortic atherosclerosis.
6. Chronic bilateral pars interarticularis defects at the L5-S1
level, with associated grade 1 spondylolisthesis of L5 on S1.

## 2018-03-02 IMAGING — CT CT HEAD W/O CM
4 of 7 series · 16 of 47 positions shown, 18 images · non-contrast
Comparison: 10/02/2017

CLINICAL DATA: Fall out of bed, history of anoxic brain injury
status post cardiac arrest

EXAM:
CT HEAD WITHOUT CONTRAST
TECHNIQUE: Contiguous axial images were obtained from the base of the skull
through the vertex without intravenous contrast.

[Series 2: head wo · axial · 0.54mm/px · z∈[-135,-15]mm · 7 of 33 slices shown, 9 images (1 of 2)]
[im 5/33  brain]
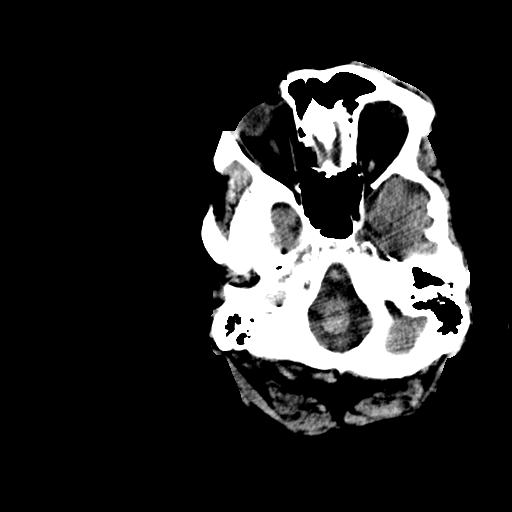
[im 5/33  bone]
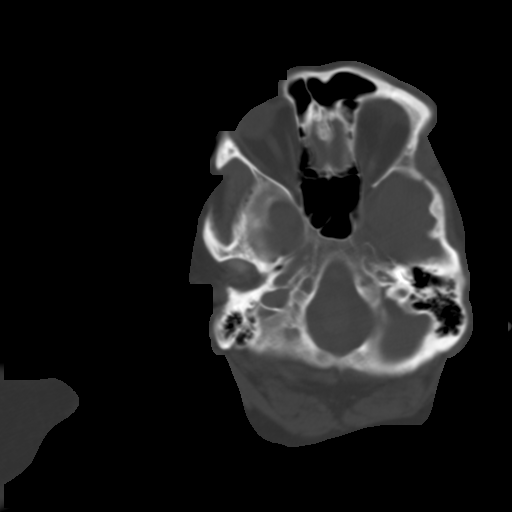
[im 9/33  brain]
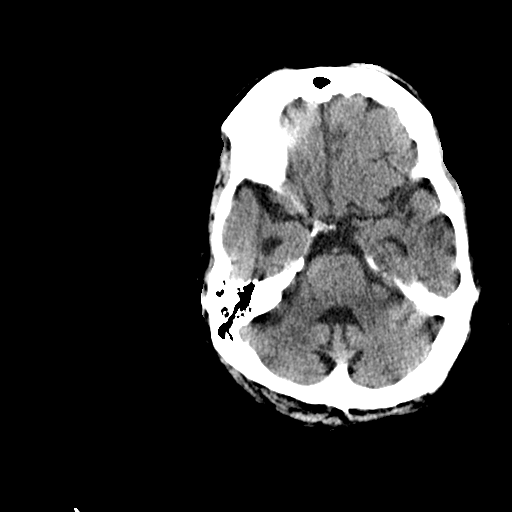
[im 13/33  brain]
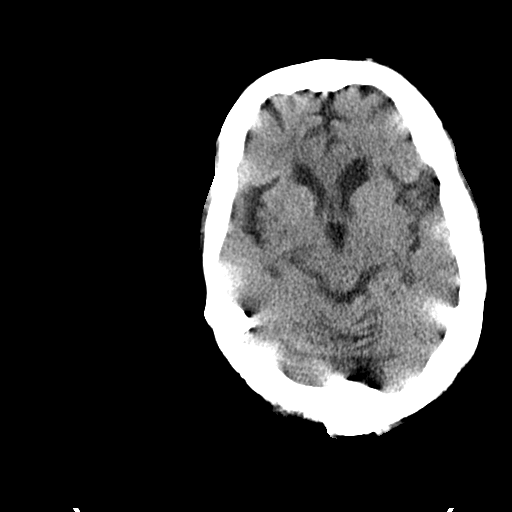
[im 17/33  brain]
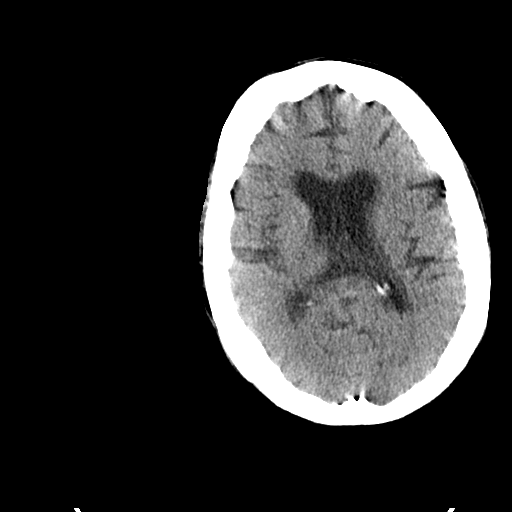
[im 21/33  brain]
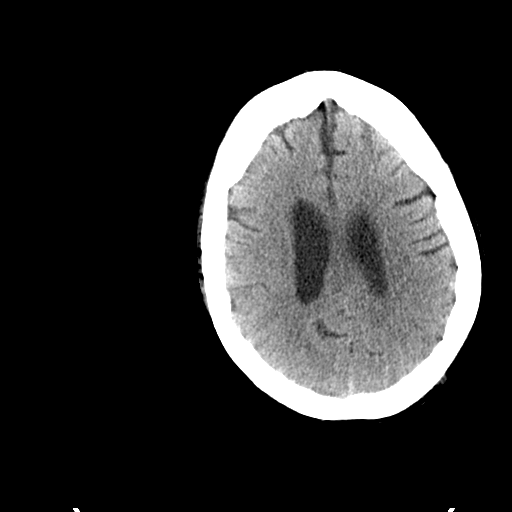
[im 21/33  bone]
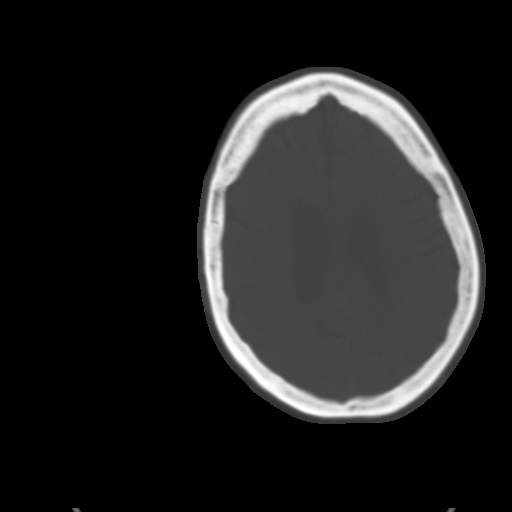
[im 25/33  brain]
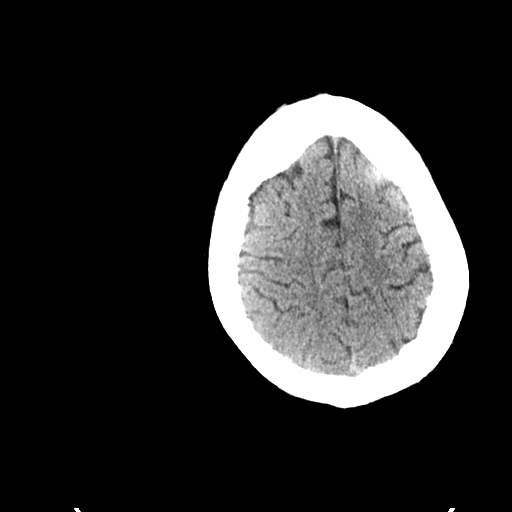
[im 29/33  brain]
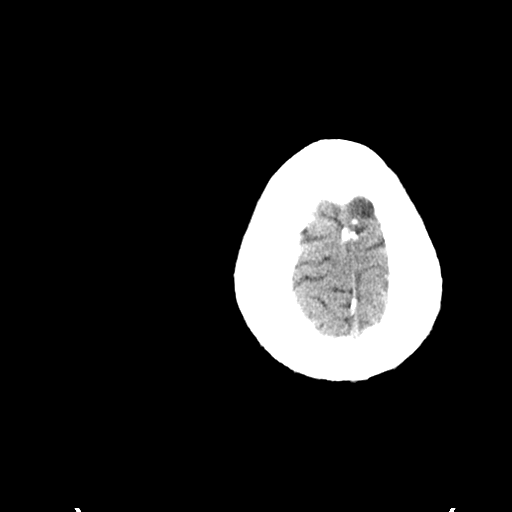

[Series 3: head wo · axial · 0.54mm/px · z∈[-94,-29]mm · 4 of 27 slices shown (2 of 2)]
[im 5/27  brain]
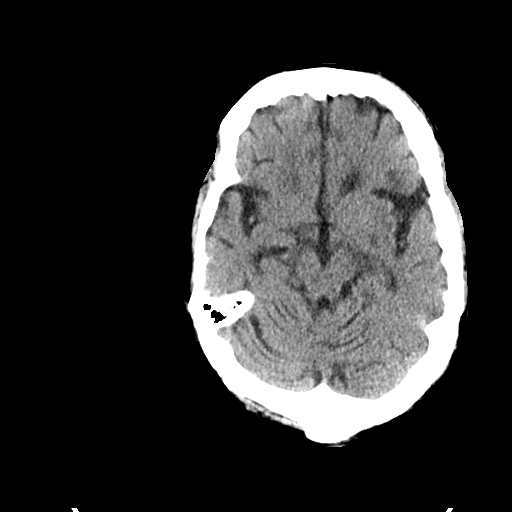
[im 9/27  brain]
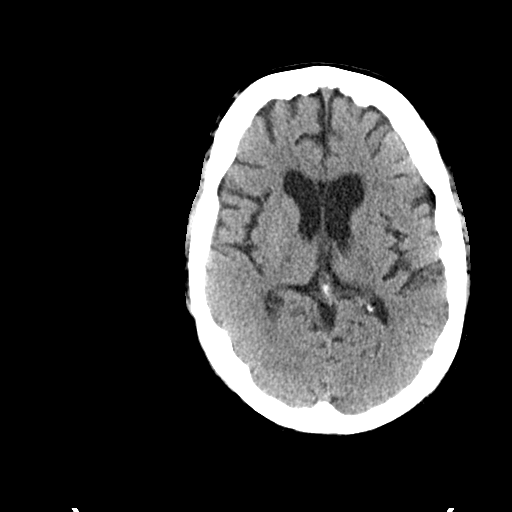
[im 14/27  brain]
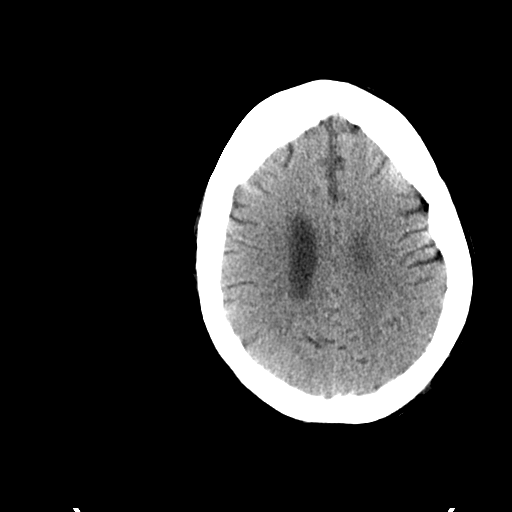
[im 18/27  brain]
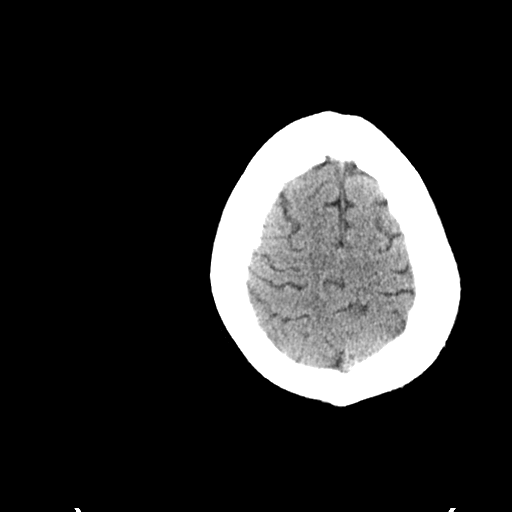

[Series 6: coronal soft tissue · coronal · 0.37mm/px · 3 of 68 slices shown]
[im 17/68  brain]
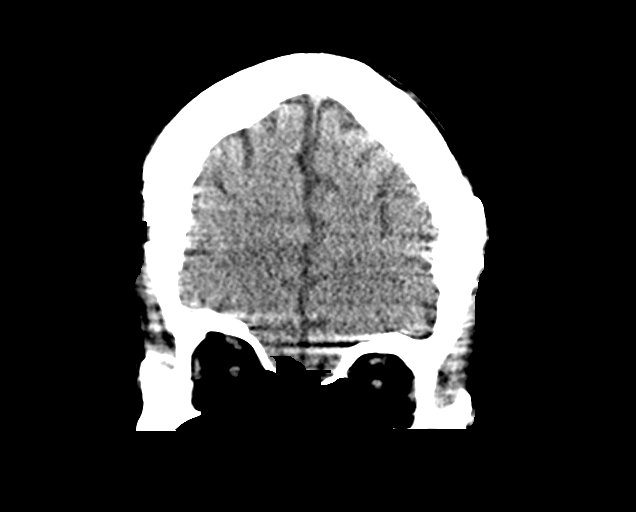
[im 34/68  brain]
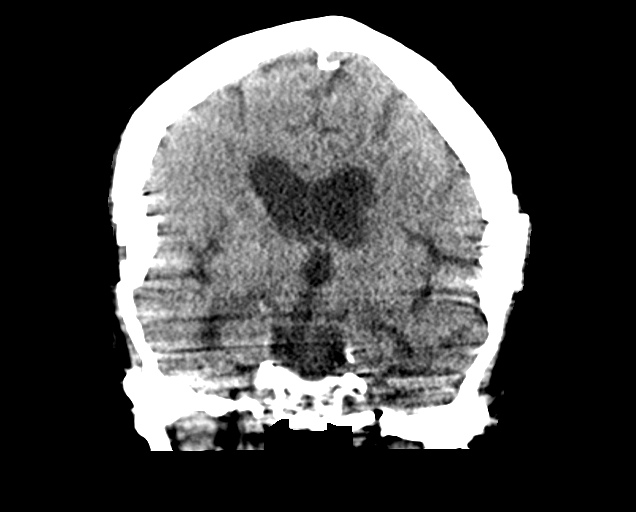
[im 51/68  brain]
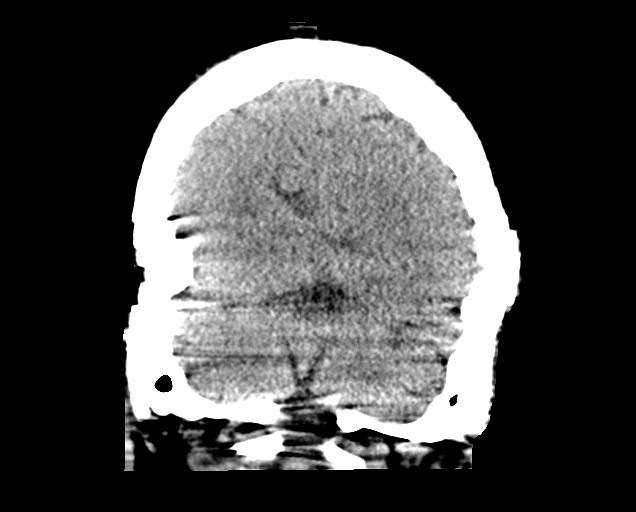

[Series 9: sagittal soft tissue · sagittal · 0.38mm/px · 2 of 54 slices shown]
[im 18/54  brain]
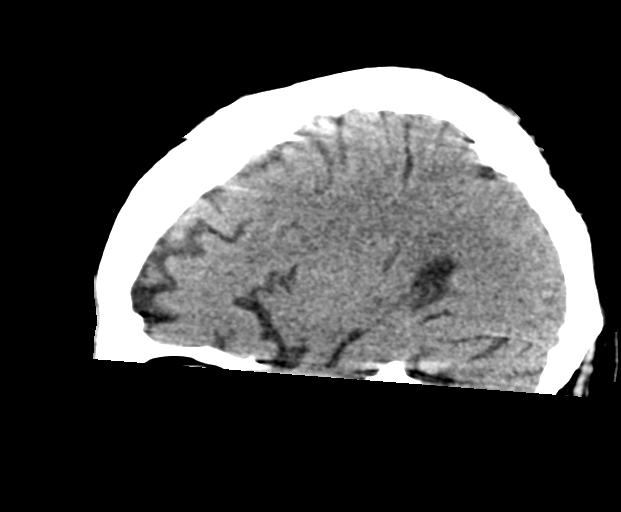
[im 36/54  brain]
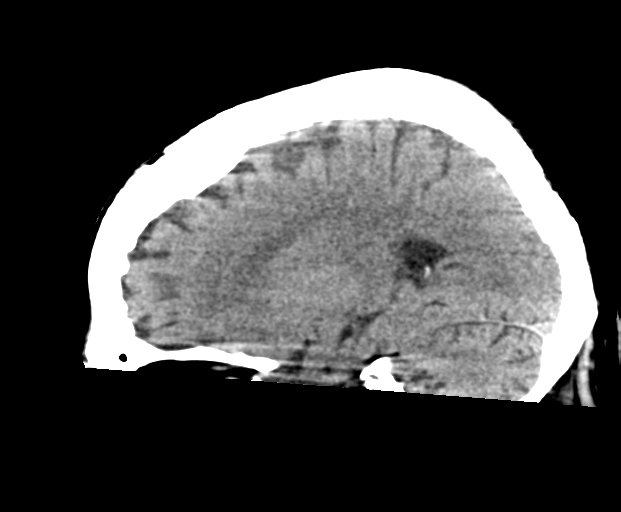

[16 of 47 positions shown; findings below may reference images not displayed]

FINDINGS: Motion degraded images.

Brain: No evidence of acute infarction, hemorrhage, hydrocephalus,
extra-axial collection or mass lesion/mass effect.

Mild cortical and central atrophy.

Subcortical white matter and periventricular small vessel ischemic
changes.

Vascular: No hyperdense vessel or unexpected calcification.

Skull: Normal. Negative for fracture or focal lesion.

Sinuses/Orbits: The visualized paranasal sinuses are essentially
clear. The mastoid air cells are unopacified.

Other: None.
IMPRESSION: No evidence of acute intracranial abnormality.

Atrophy with small vessel ischemic changes.

## 2018-03-02 IMAGING — DX DG PORTABLE PELVIS
2 series · 2 of 2 positions shown · non-contrast
Comparison: None.

CLINICAL DATA: Fall.  Bilateral hip pain.

EXAM:
PORTABLE PELVIS 1-2 VIEWS

[pelvis ap (1 of 2)]
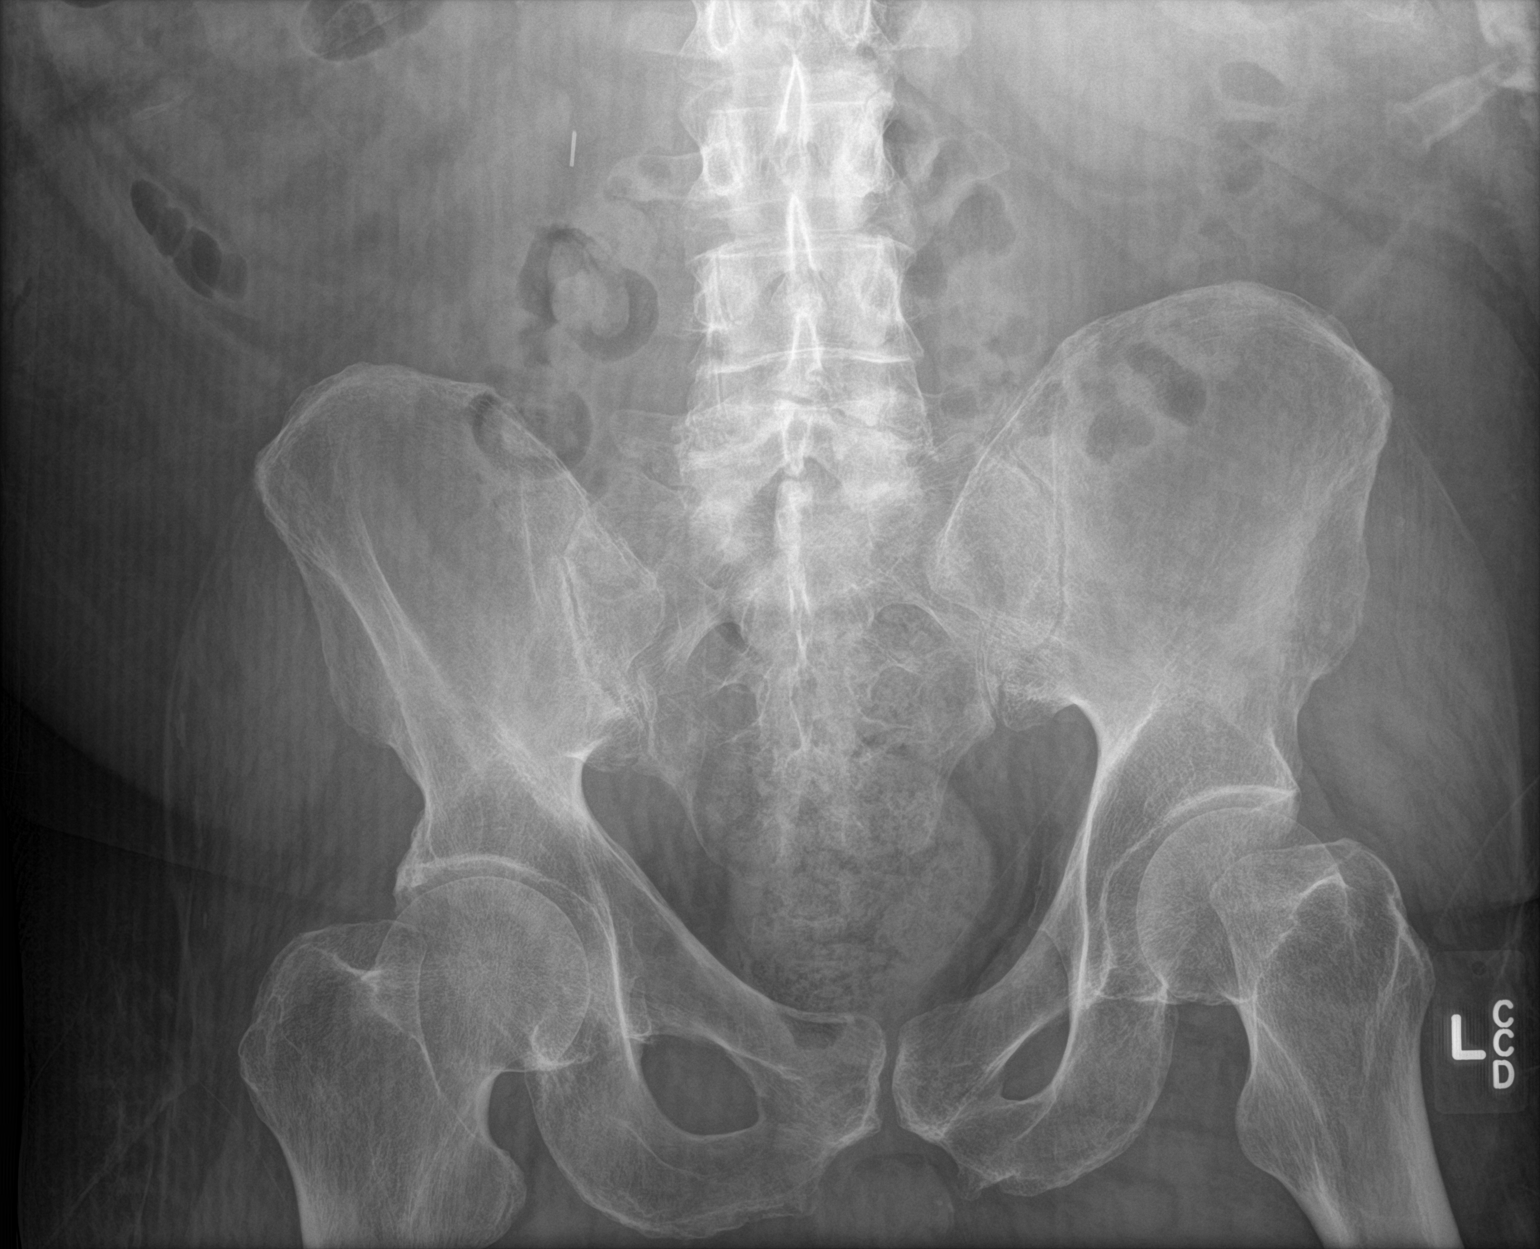

[pelvis ap (2 of 2)]
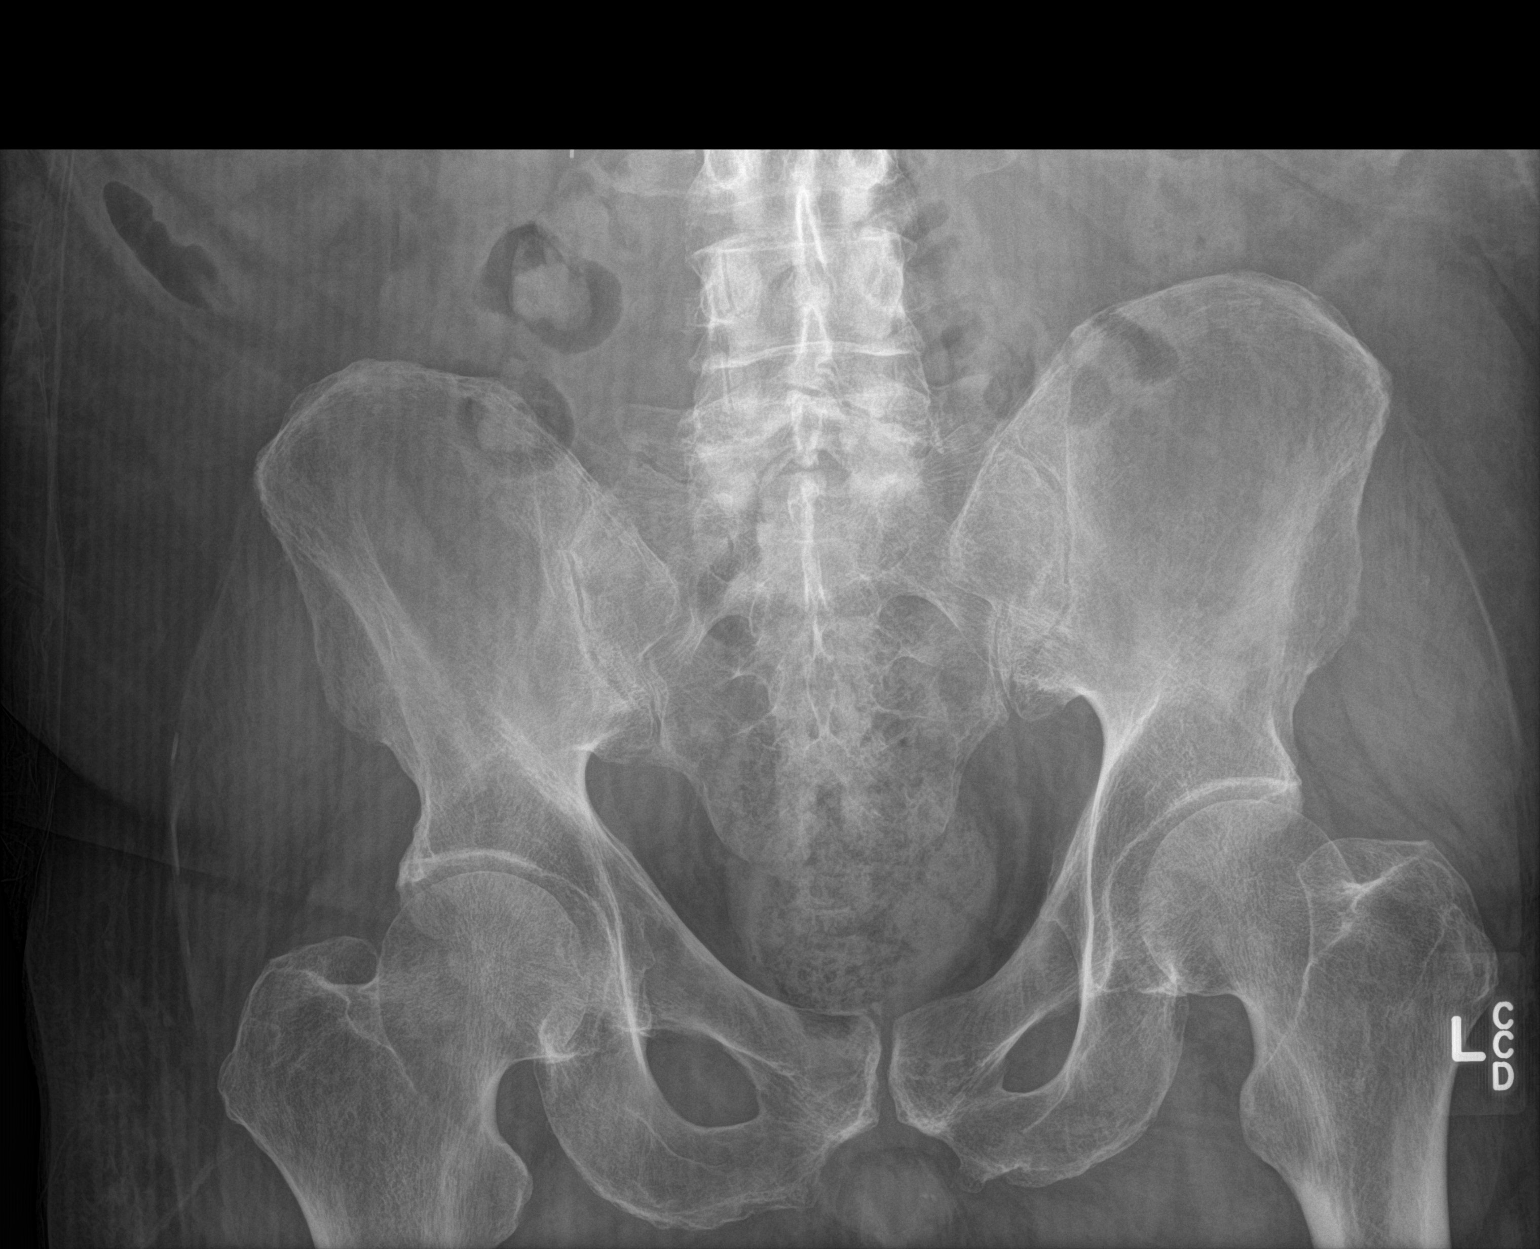

[2 of 2 positions shown; findings below may reference images not displayed]

FINDINGS: There is no evidence of pelvic fracture or diastasis. No pelvic bone
lesions are seen.
IMPRESSION: Negative.

## 2018-03-25 IMAGING — CR DG ABDOMEN 1V
2 series · 2 of 2 positions shown · non-contrast
Comparison: 01/28/2018

CLINICAL DATA: Post PEG tube placement

EXAM:
ABDOMEN - 1 VIEW

[x abdomen supine (1 of 2)]
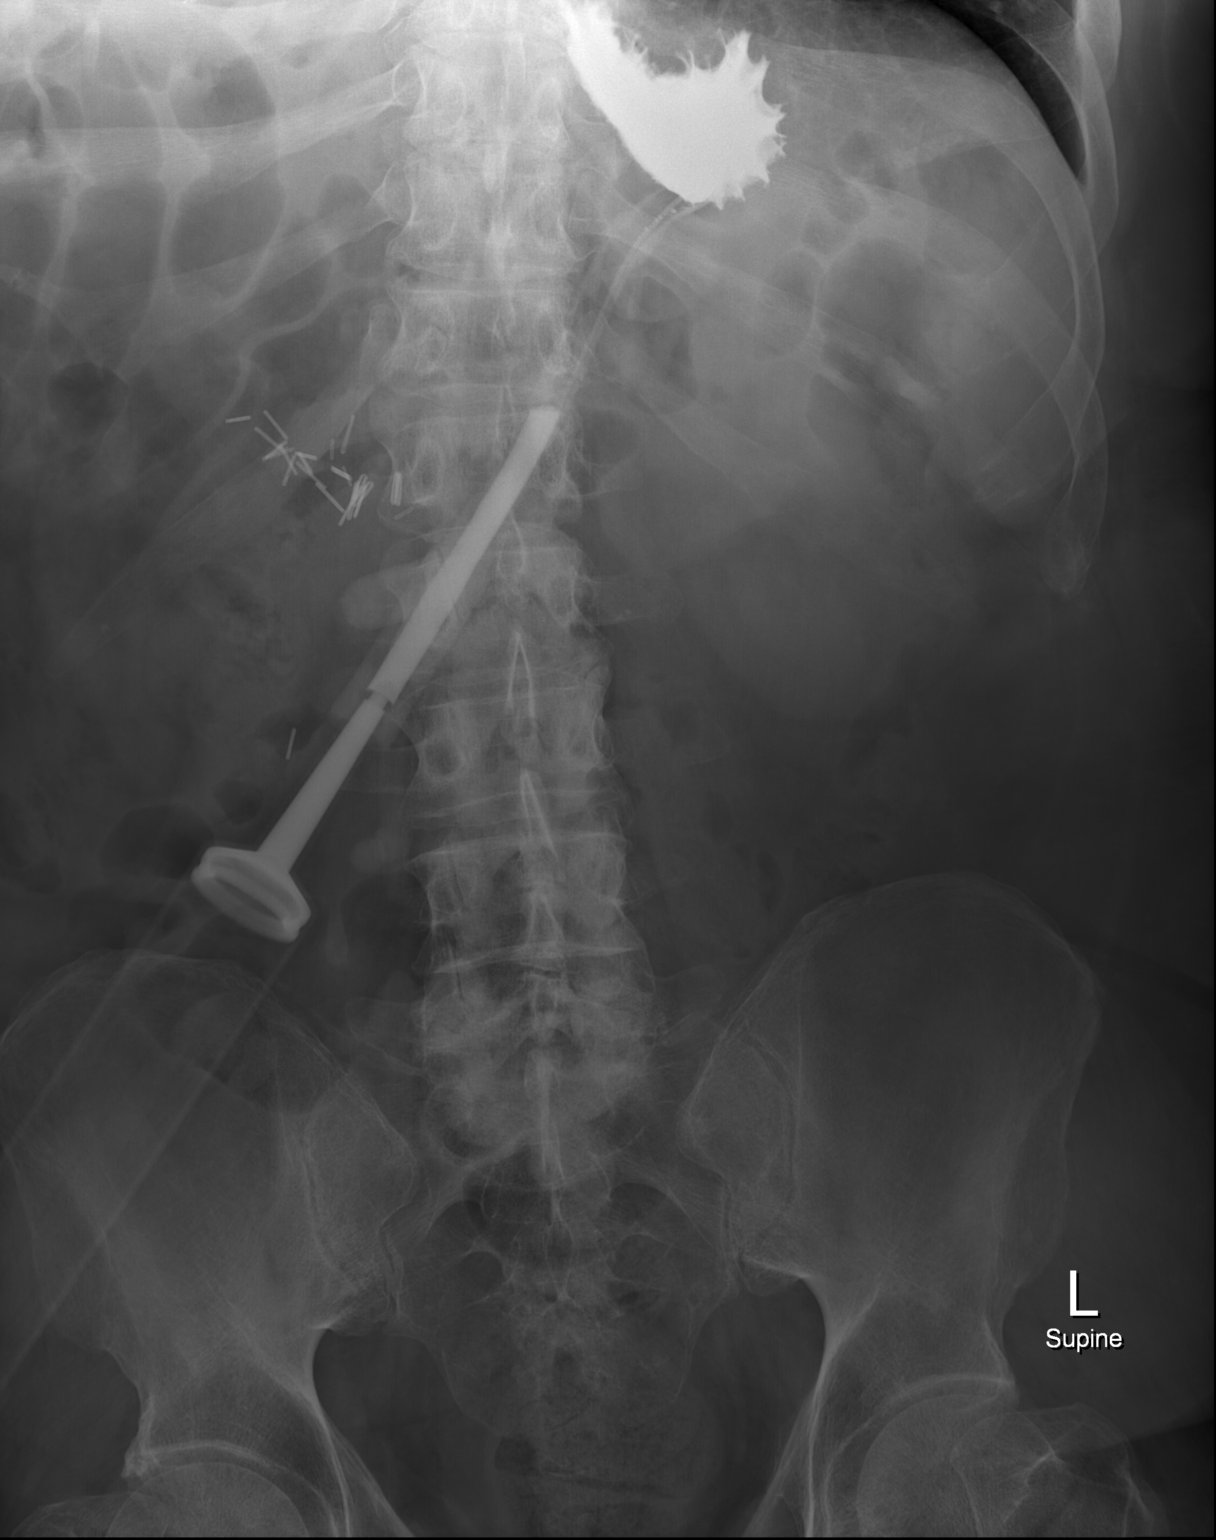

[x abdomen supine (2 of 2)]
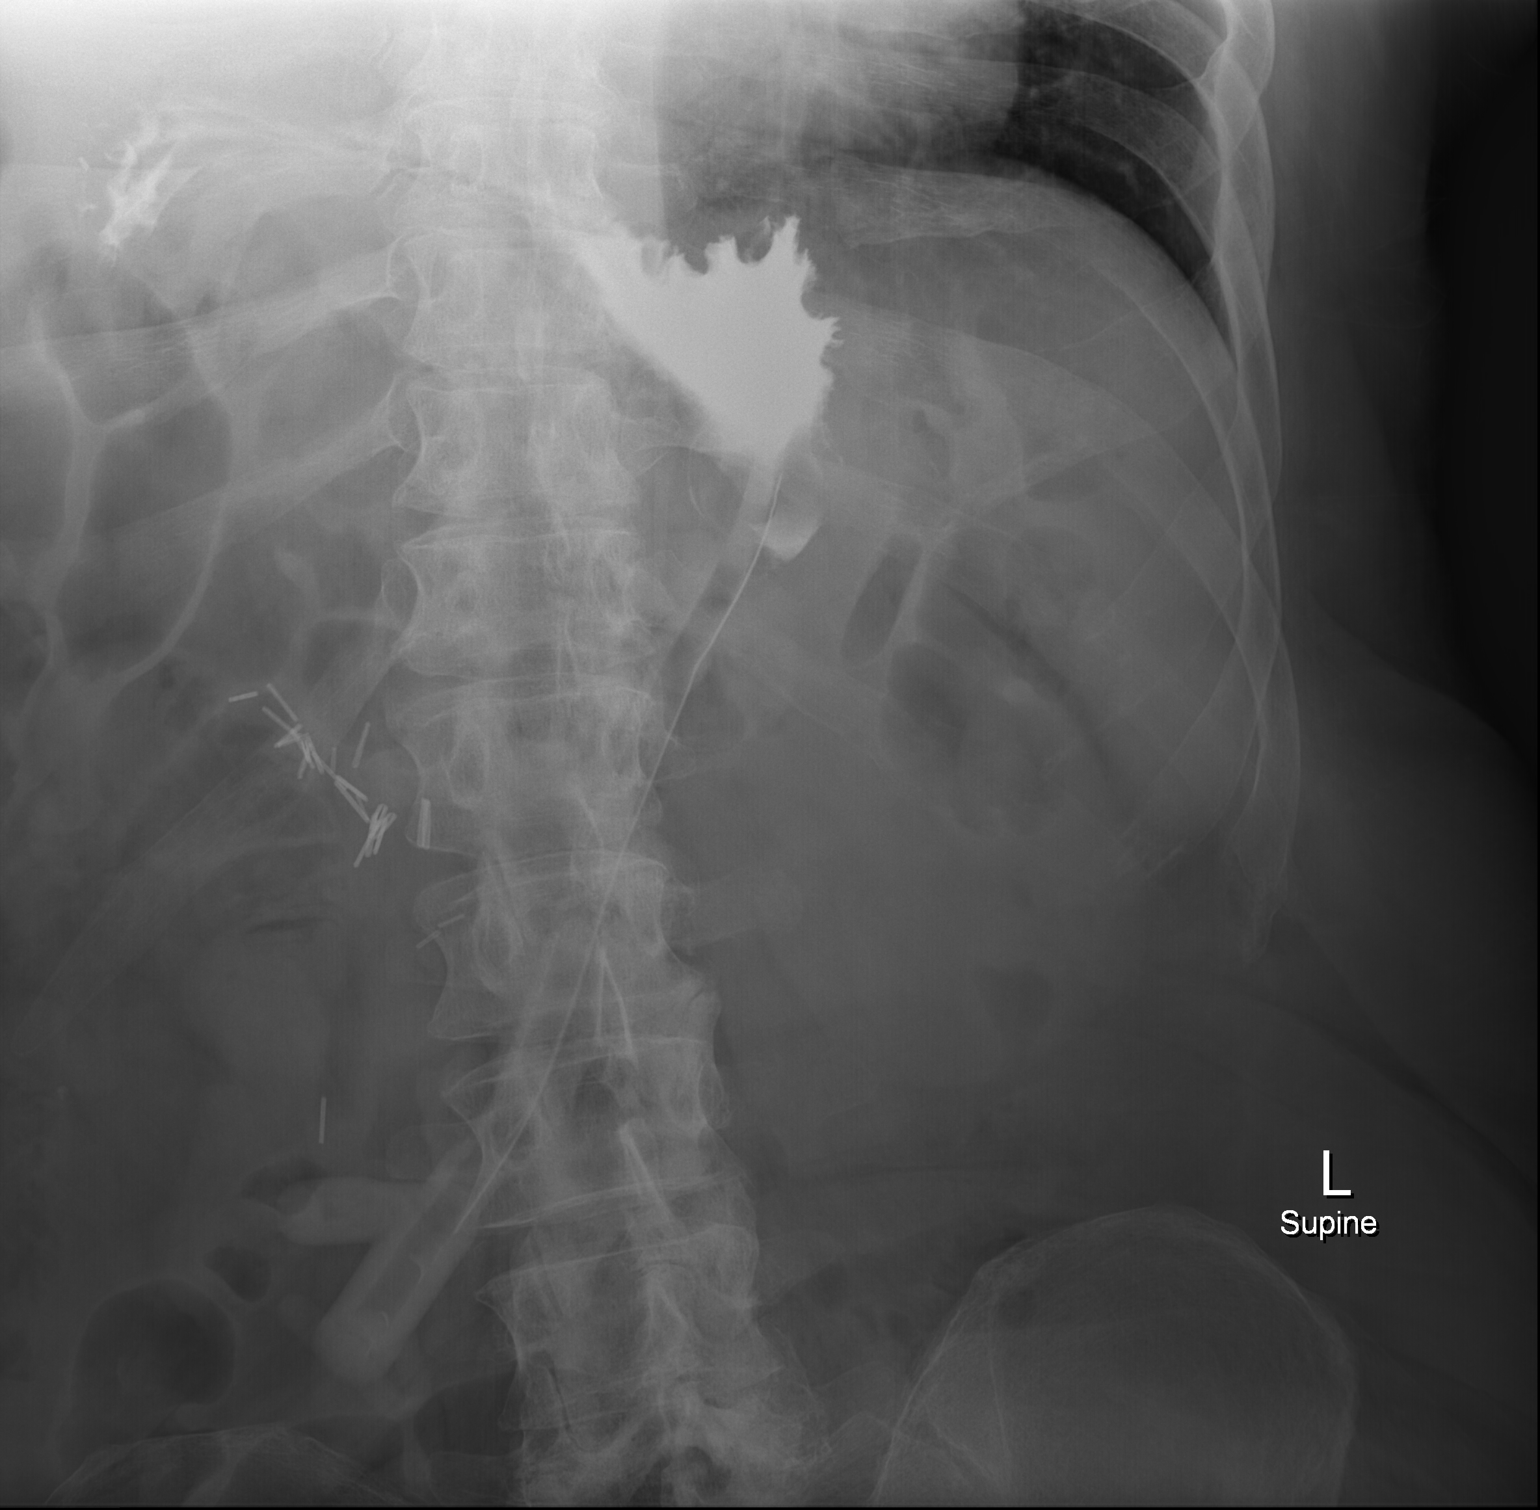

[2 of 2 positions shown; findings below may reference images not displayed]

FINDINGS: Gastrostomy tube projects over the left upper quadrant. Contrast
material injected through the gastrostomy tube fills the fundus of
the stomach. Nonobstructive bowel gas pattern.
IMPRESSION: Gastrostomy tube projects over the proximal stomach with contrast in
the fundus of the stomach. No contrast extravasation.

## 2018-05-16 ENCOUNTER — Encounter (HOSPITAL_COMMUNITY): Payer: Self-pay | Admitting: Family Medicine

## 2018-05-16 ENCOUNTER — Emergency Department (HOSPITAL_COMMUNITY)
Admission: EM | Admit: 2018-05-16 | Discharge: 2018-05-16 | Disposition: A | Discharge status: Home / Self Care | Payer: Medicare Other | Attending: Emergency Medicine | Admitting: Emergency Medicine

## 2018-05-16 ENCOUNTER — Emergency Department (HOSPITAL_COMMUNITY): Payer: Medicare Other

## 2018-05-16 DIAGNOSIS — Y92122 Bedroom in nursing home as the place of occurrence of the external cause: Secondary | ICD-10-CM | POA: Insufficient documentation

## 2018-05-16 DIAGNOSIS — R51 Headache: Secondary | ICD-10-CM | POA: Diagnosis not present

## 2018-05-16 DIAGNOSIS — S4991XA Unspecified injury of right shoulder and upper arm, initial encounter: Secondary | ICD-10-CM | POA: Insufficient documentation

## 2018-05-16 DIAGNOSIS — M25521 Pain in right elbow: Secondary | ICD-10-CM | POA: Insufficient documentation

## 2018-05-16 DIAGNOSIS — W06XXXA Fall from bed, initial encounter: Secondary | ICD-10-CM | POA: Insufficient documentation

## 2018-05-16 DIAGNOSIS — Z79899 Other long term (current) drug therapy: Secondary | ICD-10-CM | POA: Diagnosis not present

## 2018-05-16 DIAGNOSIS — I252 Old myocardial infarction: Secondary | ICD-10-CM | POA: Diagnosis not present

## 2018-05-16 DIAGNOSIS — Z91040 Latex allergy status: Secondary | ICD-10-CM | POA: Diagnosis not present

## 2018-05-16 DIAGNOSIS — M549 Dorsalgia, unspecified: Secondary | ICD-10-CM | POA: Insufficient documentation

## 2018-05-16 DIAGNOSIS — Y939 Activity, unspecified: Secondary | ICD-10-CM | POA: Diagnosis not present

## 2018-05-16 DIAGNOSIS — S42211A Unspecified displaced fracture of surgical neck of right humerus, initial encounter for closed fracture: Secondary | ICD-10-CM

## 2018-05-16 DIAGNOSIS — E039 Hypothyroidism, unspecified: Secondary | ICD-10-CM | POA: Insufficient documentation

## 2018-05-16 DIAGNOSIS — Y999 Unspecified external cause status: Secondary | ICD-10-CM | POA: Diagnosis not present

## 2018-05-16 DIAGNOSIS — Z7982 Long term (current) use of aspirin: Secondary | ICD-10-CM | POA: Insufficient documentation

## 2018-05-16 DIAGNOSIS — W19XXXA Unspecified fall, initial encounter: Secondary | ICD-10-CM

## 2018-05-16 MED ORDER — RESOURCE THICKENUP CLEAR PO POWD
ORAL | Status: DC | PRN
  Filled 2018-05-16: qty 125

## 2018-05-16 MED ORDER — FOOD THICKENER (SIMPLYTHICK): 2.0000 | ORAL | Status: DC | PRN

## 2018-05-16 MED ORDER — ACETAMINOPHEN 325 MG PO TABS
650.0000 mg | ORAL_TABLET | Freq: Once | ORAL | Status: AC
  Administered 2018-05-16: 650 mg via ORAL
  Filled 2018-05-16: qty 2

## 2018-05-16 NOTE — ED Notes (Signed)
PTAR at bedside 

## 2018-05-16 NOTE — Discharge Instructions (Addendum)
Today you were diagnosed with a humeral neck fracture. You may take tylenol every 6 hours for your pain.  You were given a sling to wear until you are seen by the orthopedic doctor. You will need to follow up with the orthopedic doctor, Dr. Lequita Halt, for further treatment and evaluation of this. Please follow up with your primary care physician in 1 week for re-evaluation. You will need to return to the emergency department for any new or worsening symptoms including numbness to your arm, weakness to your arm, inability to move your arm.

## 2018-05-16 NOTE — ED Notes (Signed)
Notified PTAR for transportation back to [facility].  

## 2018-05-16 NOTE — ED Triage Notes (Signed)
Patient is from [facility] and transported via [facility]. On 5/17, patient rolled out of bed at floor level, unwitnessed. On 5/18, patient fell out of wheelchair, unwitnessed. On 5/19, patient started complaining of right shoulder pain. An x-ray was performed of shoulder, resulting in an old healed displaced humeral neck fx but clinical correlation is recommended to exclude superimposed acute fx since the fx site is more displaced compared to 10/02/17. CT is recommended.

## 2018-05-16 NOTE — ED Notes (Signed)
Patients mother, Machai Desmith, called and requesting information about patient. Informed [place], PA to call mother at [phone].

## 2018-05-16 NOTE — ED Notes (Signed)
Bed: [facility] Expected date:  Expected time:  Means of arrival:  Comments: EMS fall  

## 2018-05-16 NOTE — ED Provider Notes (Addendum)
[facility]-EMERGENCY DEPT Provider Note   CSN: [id number] Arrival date & time: 05/16/18  1553     History   Chief Complaint Chief Complaint  Patient presents with  . Fall  . Shoulder Injury    HPI [name] is a 52 y.o. male.  HPI  Pt is a 52 y/o male with a h/o anoxic brain injury s/p MI, HTN, seizure disorder, afib who presents to the ED today to be evaluated after multiple falls that have occurred during this last week. According to triage note patient fellt on 5/17 after rolling out of bed. He also fell on 5/18 out of wheelchair. Falls were unwitnessed. On 5/19 pt c/o right shoulder pain and an xray was performed showing an old healed displaced humeral neck fracture. Clinical correlation was recommended via CT scan.  On my evaluation pt endorses right shoulder pain and right elbow pain. He denies CP and SOB. He states that he hit his head when he fell and lost consciousness for several minutes. He denies neck pain. Denies abd pain or BLE pain. Denies numbness/weakness to the BUE and BLE. He states he has received pain medications at his facility which are not improving his symptoms.   Communication is limited as patient does have history of anoxic brain injury.  He is able to answer some questions appropriately but mostly is able to only answer yes or no questions.  Past Medical History:  Diagnosis Date  . Addison disease (HCC)   . Anoxic brain injury (HCC)   . Anxiety   . Aspiration pneumonia (HCC)   . Atrial fibrillation (HCC)   . Chronic constipation 11/23/2011  . Dysphagia   . Encephalopathy   . Glaucoma   . Glucocorticoid deficiency (HCC)   . History of recurrent UTIs   . Hyperlipemia   . Hypothyroidism   . Mental disorder   . Myocardial infarction (HCC)   . Pneumoperitoneum of unknown etiology 12/21/2011  . Quadriplegia (HCC)   . Reflux   . Seizure disorder [facility])     Patient Active Problem List   Diagnosis Date Noted  . PEG  Gastrostomy tube in place (HCC) 01/26/2018  . 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 01/26/2018 01/26/2018  . Bedridden 01/26/2018  . Quadriplegia (HCC)   . Abdominal wall abscess 01/25/2018  . Fracture of humerus anatomical neck, right, closed, initial encounter 10/03/2017  . AKI (acute kidney injury) (HCC) 10/03/2017  . UTI (urinary tract infection) 10/03/2017  . Agitation 07/03/2014  . Cardiomyopathy, ischemic 06/18/2014  . 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 Acute psychosis (HCC) 04/19/2014  . Unspecified hypothyroidism 05/16/2013  . Constipation 05/16/2013  . Anemia of other chronic disease 05/16/2013  . Mental disorder 01/03/2013  . Addisons disease (HCC) 01/03/2013  . PEG (percutaneous endoscopic gastrostomy) adjustment/replacement/removal (HCC) 12/21/2011  . Hypokalemia 11/25/2011  . Thrombocytopenia (HCC) 11/25/2011  . FTT (failure to thrive) in adult 11/25/2011  . Grand mal seizure (HCC) 11/23/2011  . Depression (emotion) 11/23/2011  . History of atrial fibrillation 11/23/2011  . Other specified hemorrhagic conditions (HCC) 11/23/2011  . Glaucoma 11/23/2011  . Hypoglycemia 11/23/2011  . Chronic constipation 11/23/2011  . Anoxic brain injury (HCC) 11/12/2011  . Protein calorie malnutrition (HCC) 11/12/2011  . Hyperchloremia 11/12/2011  . Hyperthyroidism 11/12/2011  . Acute lower UTI 11/12/2011  . Dysphagia     Past Surgical History:  Procedure Laterality Date  . DEBRIDEMENT OF ABDOMINAL WALL ABSCESS  01/26/2018  . HERNIA MESH REMOVAL  01/26/2018  Infected mesh - removed w I&D abd wall abscess  . IR GENERIC HISTORICAL  10/20/2016   IR REPLC GASTRO/COLONIC TUBE PERCUT W/FLUORO 10/20/2016 Oley Balm, MD [facility]INTER[facility]  . IR REPLACE G-TUBE SIMPLE WO FLUORO  01/28/2018  . IR REPLC GASTRO/COLONIC TUBE PERCUT W/FLUORO  04/21/2017  . IR REPLC GASTRO/COLONIC TUBE PERCUT W/FLUORO  07/22/2017  . IR REPLC GASTRO/COLONIC TUBE PERCUT W/FLUORO  11/24/2017  .  IRRIGATION AND DEBRIDEMENT ABSCESS N/A 01/26/2018   Procedure: IRRIGATION AND DEBRIDEMENT ABDOMINAL WALL ABSCESS WITH REMOVAL OF INFECTED MESH;  Surgeon: Karie Soda, MD;  Location: [facility];  Service: General;  Laterality: N/A;  . NEPHRECTOMY  unknown  . PEG TUBE PLACEMENT          Home Medications    Prior to Admission medications   Medication Sig Start Date End Date Taking? Authorizing Provider  acetaminophen (TYLENOL) 325 MG tablet Place 650 mg into feeding tube every 4 (four) hours as needed for mild pain.    [provider]  aspirin EC 81 MG tablet Take 1 tablet (81 mg total) by mouth daily. With food 01/30/18 01/30/19  Shon Hale, MD  divalproex (DEPAKOTE SPRINKLE) 125 MG capsule Take 500 mg by mouth 3 (three) times daily.  07/18/13   [provider]  levothyroxine (SYNTHROID, LEVOTHROID) 200 MCG tablet Take 200 mcg by mouth daily before breakfast.    [provider]  METOPROLOL SUCCINATE ER PO 12.5 mg by PEG Tube route 2 (two) times daily. Metoprolol tartrate 6.25 mg/5 ml suspension    [provider]  Multiple Vitamins-Minerals (CERTAVITE/ANTIOXIDANTS) LIQD 15 mLs by PEG Tube route daily at 12 noon.     [provider]  Nutritional Supplements (VITAL 1.5 CAL PO) Take 237 mLs by mouth as needed (For less than 50% food consumption.).    [provider]  pantoprazole sodium (PROTONIX) 40 mg/20 mL PACK 40 mg by PEG Tube route daily at 12 noon.  11/30/11   [name], MD  PARoxetine (PAXIL) 20 MG tablet Take 20 mg by mouth daily. 06/03/17   [provider]  polyethylene glycol (MIRALAX / GLYCOLAX) packet 17 g by PEG Tube route 2 (two) times daily. Mix 17gm  In 4-8 ounces of liquid    [provider]  polyvinyl alcohol (LIQUIFILM TEARS) 1.4 % ophthalmic solution Place 1 drop into both eyes 4 (four) times daily. 9am, 2pm, 6pm, 9pm    [provider]  predniSONE (DELTASONE) 10 MG tablet Take with  Breakfast......20 mg daily for 4 days, then 10 mg daily for 4 days, then 5 mg daily after that 01/30/18   Shon Hale, MD  risperiDONE (RISPERDAL M-TABS) 2 MG disintegrating tablet Place 2 mg under the tongue 2 (two) times daily. 05/08/17   [provider]  sennosides (SENOKOT) 8.8 MG/5ML syrup Take 15 mLs by mouth 2 (two) times daily.    [provider]  traZODone (DESYREL) 50 MG tablet Take 50 mg by mouth 2 (two) times daily.  08/02/13   [provider]    Family History History reviewed. No pertinent family history.  Social History Social History   Tobacco Use  . Smoking status: Never Smoker  . Smokeless tobacco: Never Used  Substance Use Topics  . Alcohol use: No  . Drug use: No     Allergies   Ativan [lorazepam]; Latex; Morphine and related; Penicillins; Pyridium [phenazopyridine hcl]; and Soy allergy   Review of Systems Review of Systems  Reason unable to perform  ROS: limited communication due to h/o anoxic brain injury.  Respiratory: Negative for shortness of breath.   Cardiovascular: Negative for chest pain.  Gastrointestinal: Negative for abdominal pain, nausea and vomiting.  Genitourinary: Negative for flank pain.  Musculoskeletal: Positive for back pain. Negative for neck pain.       Right shoulder and elbow pain  Skin: Negative for wound.  Neurological: Negative for dizziness, weakness, light-headedness and headaches.       Head trauma, LOC    Physical Exam Updated Vital Signs BP 116/67 (BP Location: Left Arm)   Pulse 64   Temp 98.7 F (37.1 C) (Oral)   Resp 20   Ht  (1.854 m)   Wt 106.7 kg (235 lb 2 oz)   SpO2 100%   BMI 31.02 kg/m   Physical Exam  Constitutional: He appears well-developed and well-nourished. No distress.  HENT:  Head: Normocephalic and atraumatic.  No battle signs, no raccoons eyes, no rhinorrhea.  Eyes: Conjunctivae are normal.  Neck: Neck supple.  Cardiovascular: Normal rate, regular rhythm,  normal heart sounds and intact distal pulses.  No murmur heard. Pulmonary/Chest: Effort normal and breath sounds normal. No stridor. No respiratory distress. He has no wheezes.  Abdominal: Soft. Bowel sounds are normal. He exhibits no distension. There is no tenderness. There is no guarding.  Musculoskeletal: He exhibits no edema.  No TTP to the cervical spine. Mild ttp to the lower thoracic and lumbar spine without obvious stepoff. TTP to the right proximal humerus and right elbow with overlying ecchymosis to both. No ttp to clavicles or chest wall. Distal pulses and sensation are intact and symmetric.   Neurological: He is alert.  Mental Status:  Alert, thought content appropriate, able to give a coherent history. Speech fluent without evidence of aphasia. Able to follow 2 step commands without difficulty.  Cranial Nerves:  II:  Peripheral visual fields grossly normal, pupils equal, round, reactive to light III,IV, VI: ptosis not present, extra-ocular motions intact bilaterally  V,VII: smile symmetric, facial light touch sensation equal VIII: hearing grossly normal to voice  X: uvula elevates symmetrically  XI: bilateral shoulder shrug symmetric and strong XII: midline tongue extension without fassiculations Motor:  Normal tone. 5/5 strength of BUE and BLE major muscle groups including strong and equal grip strength and dorsiflexion/plantar flexion Sensory: light touch normal in all extremities. DTRs: biceps and achilles 2+ symmetric b/l Cerebellar: normal finger-to-nose with bilateral upper extremities Gait: normal gait and balance. Able to walk on toes and heels with ease.  CV: 2+ radial and DP/PT pulses  Skin: Skin is warm and dry.  Psychiatric: He has a normal mood and affect.  Nursing note and vitals reviewed.   ED Treatments / Results  Labs (all labs ordered are listed, but only abnormal results are displayed) Labs Reviewed - No data to display  EKG None  Radiology Dg  Thoracic Spine 2 View  Result Date: 05/16/2018 CLINICAL DATA:  Larey Seat out of bed, back pain EXAM: THORACIC SPINE 2 VIEWS COMPARISON:  Chest x-ray 10/02/2017 FINDINGS: Mild dextroscoliosis. Limited lateral views of the upper thoracic spine. Imaged mid and lower vertebral bodies demonstrate grossly normal stature. IMPRESSION: No definite acute osseous abnormality allowing for limited visualization of upper thoracic vertebra on lateral view Electronically Signed   By: Jasmine Pang M.D.   On: 05/16/2018 18:32   Dg Lumbar Spine Complete  Result Date: 05/16/2018 CLINICAL DATA:  Fall with back pain EXAM: LUMBAR SPINE - COMPLETE 4+ VIEW COMPARISON:  02/20/2018, CT 01/25/2017 FINDINGS: Surgical clips and gastrostomy tube in the upper abdomen. Five non rib-bearing lumbar type vertebra. Suboptimal visualization of the lumbosacral junction. Lumbar alignment grossly within normal limits. Vertebral body heights appear maintained. Mild degenerative narrowing at L5-S1 and L2-L3. IMPRESSION: No definite acute osseous abnormality allowing for positioning. Electronically Signed   By: Jasmine Pang M.D.   On: 05/16/2018 18:35   Dg Elbow Complete Right  Result Date: 05/16/2018 CLINICAL DATA:  Pain after multiple falls. EXAM: RIGHT ELBOW - COMPLETE 3+ VIEW COMPARISON:  Humerus films of 10/02/2017. FINDINGS: Mildly oblique lateral view. Degenerative irregularity about the coronoid process of the ulna. No acute fracture or dislocation. No joint effusion. IMPRESSION: No acute osseous abnormality. Electronically Signed   By: Jeronimo Greaves M.D.   On: 05/16/2018 18:32   Ct Head Wo Contrast  Result Date: 05/16/2018 CLINICAL DATA:  Headache.  Fell on floor. EXAM: CT HEAD WITHOUT CONTRAST TECHNIQUE: Contiguous axial images were obtained from the base of the skull through the vertex without intravenous contrast. COMPARISON:  01/28/2018 FINDINGS: Brain: Cerebral and cerebellar atrophy for is age advanced. No mass lesion, hemorrhage,  hydrocephalus, acute infarct, intra-axial, or extra-axial fluid collection. Vascular: No hyperdense vessel or unexpected calcification. Skull: Mild motion degradation. No definite soft tissue swelling. No skull fractures. Hyperostosis frontalis interna Sinuses/Orbits: Normal imaged portions of the orbits and globes. Clear paranasal sinuses and mastoid air cells. Other: None. IMPRESSION: 1. Mild motion degradation. 2.  No acute intracranial abnormality. 3. Cerebral and cerebellar atrophy. Electronically Signed   By: Jeronimo Greaves M.D.   On: 05/16/2018 18:04   Ct Shoulder Right Wo Contrast  Result Date: 05/16/2018 CLINICAL DATA:  Right shoulder pain related to two previous falls. EXAM: CT OF THE UPPER RIGHT EXTREMITY WITHOUT CONTRAST TECHNIQUE: Multidetector CT imaging of the upper right extremity was performed according to the standard protocol. COMPARISON:  None. FINDINGS: Remote displaced and ununited fracture of the humeral neck. The humeral head is displaced posteriorly and rotated approximately 90 degrees. Exuberant attempted callus formation and new periosteal bone. The glenoid is intact. No glenoid fracture. [facility] joint is intact. The clavicle and scapula are intact. The visualized right ribs are grossly intact despite motion artifact. No obvious lung lesions. IMPRESSION: Remote displaced ununited fracture of the humeral neck. No acute fractures are identified. Electronically Signed   By: Rudie Meyer M.D.   On: 05/16/2018 18:02    Procedures Procedures (including critical care time) SPLINT APPLICATION Date/Time: 4:59 PM Authorized by: Karrie Meres Consent: Verbal consent obtained. Risks and benefits: risks, benefits and alternatives were discussed Consent given by: patient Splint applied by: orthopedic technician Location details: RUE Splint type: sling and swath Post-procedure: The splinted body part was neurovascularly unchanged following the procedure. Patient tolerance: Patient  tolerated the procedure well with no immediate complications.   Medications Ordered in ED Medications  acetaminophen (TYLENOL) tablet 650 mg (650 mg Oral Given 05/16/18 1727)     Initial Impression / Assessment and Plan / ED Course  I have reviewed the triage vital signs and the nursing notes.  Pertinent labs & imaging results that were available during my care of the patient were reviewed by me and considered in my medical decision making (see chart for details).   4:43 Contacted nursing staff at [facility], spoke with Mon, who states that patient has been in his normal state of health over the last week.  She states that his multiple falls are due  to him trying to get out of bed without assistance.  She is unaware of him getting knocked out during any falls.  8:58 PM Contacted pts family member, Jaelan Rasheed, as she requested an update.   Final Clinical Impressions(s) / ED Diagnoses   Final diagnoses:  Fall, initial encounter  Closed displaced fracture of surgical neck of right humerus, unspecified fracture morphology, initial encounter   Pt is a 52 y/o male h/o anoxic brain injury s/p MI, HTN, seizure disorder, afib who presents after several mechanical falls that have occurred earlier this week.  Patient sent to ED after he had complained of shoulder pain and x-ray of shoulder showed proximal humerus fracture which recommended CT follow-up as age of fracture unable to be identified by x-ray.  Patient's vital signs stable on arrival and he is in no acute distress.  Does have tenderness to the proximal humerus and right elbow.  He tells me that he lost consciousness during 1 of his falls so CT scan head was obtained which was negative for any acute intracranial abnormality.  CT of the shoulder completed on the right side which confirmed a remote displaced ununited fracture of the humeral neck.  Exuberant callus formation and new periosteal bone are  developing.  Given that fracture is remote patient was placed in sling and swath and given referral to orthopedics for outpatient follow-up.  He is neurovascularly intact distally and does have good range of motion and strength to the right upper extremity.  X-ray of the right elbow completed and was negative for any acute fractures.  No acute osseous abnormalities identified to the thoracic or lumbar spine on x-ray.  Patient was given Tylenol in the department and stated that his symptoms felt somewhat improved.  Discussed the results of his imaging and plan for discharge.  Patient transported by P. Laurann Montana back to rehab facility.  As stated above patient given referral to orthopedics.  Advised follow-up with orthopedics as well as PCP next week for reevaluation.  All questions answered and patient understands the plan.  Strict return precautions discussed and written on discharge paperwork.  ED Discharge Orders    None       Karrie Meres, PA-C 05/17/18 [id number] PA-C 05/17/18 1700    Nira Conn, MD 05/18/18 2109

## 2018-05-16 NOTE — ED Notes (Signed)
Patient attempted to get out of stretcher, requesting to speak with Silvio Pate, RN. Clydie Braun, MUS and myself went to bedside. Silvio Pate, RN came bedside from triage. He calmed down and resting quietly.

## 2018-05-16 NOTE — ED Notes (Signed)
Notified John, ortho tech of patients orders.

## 2018-06-18 IMAGING — CR DG THORACIC SPINE 2V
3 series · 3 of 3 positions shown · non-contrast
Comparison: Chest x-ray 10/02/2017

CLINICAL DATA: Fell out of bed, back pain

EXAM:
THORACIC SPINE 2 VIEWS

[t thoracic spine ap]
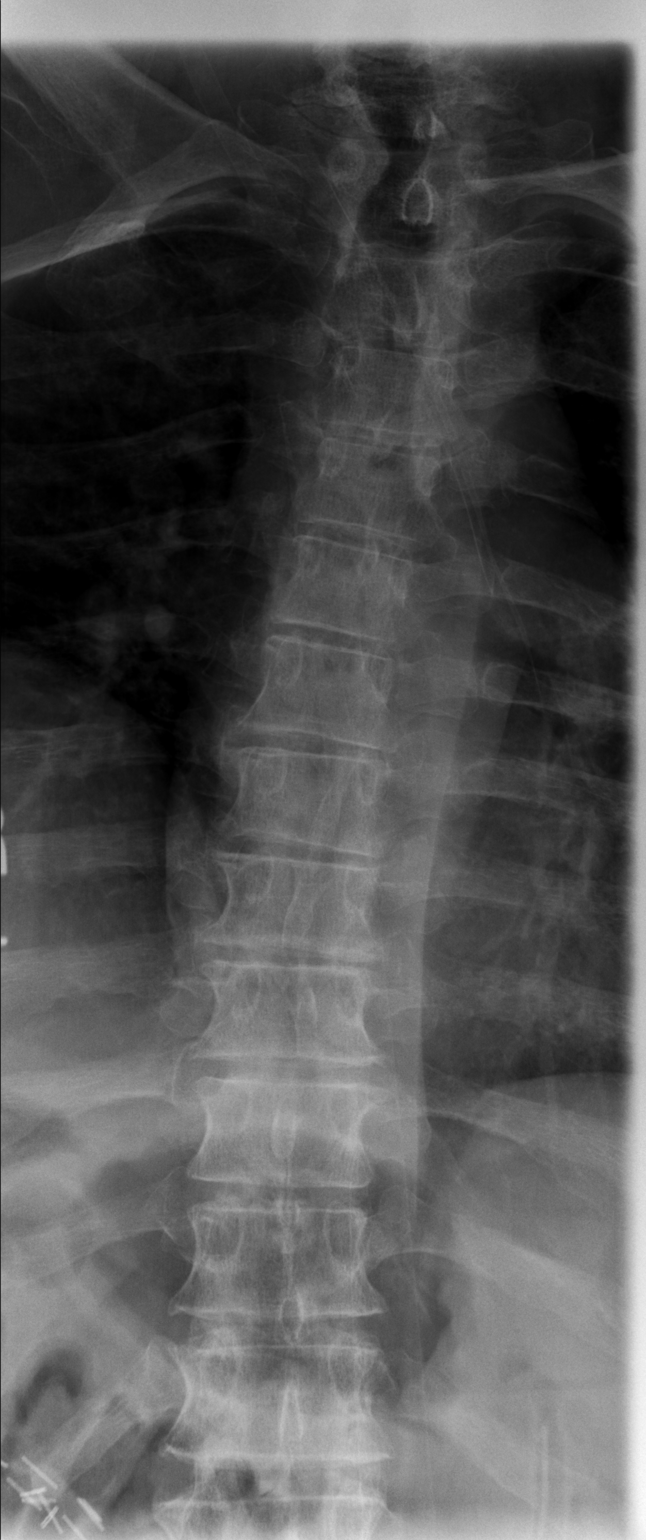

[w thoracic spine lat (1 of 2)]
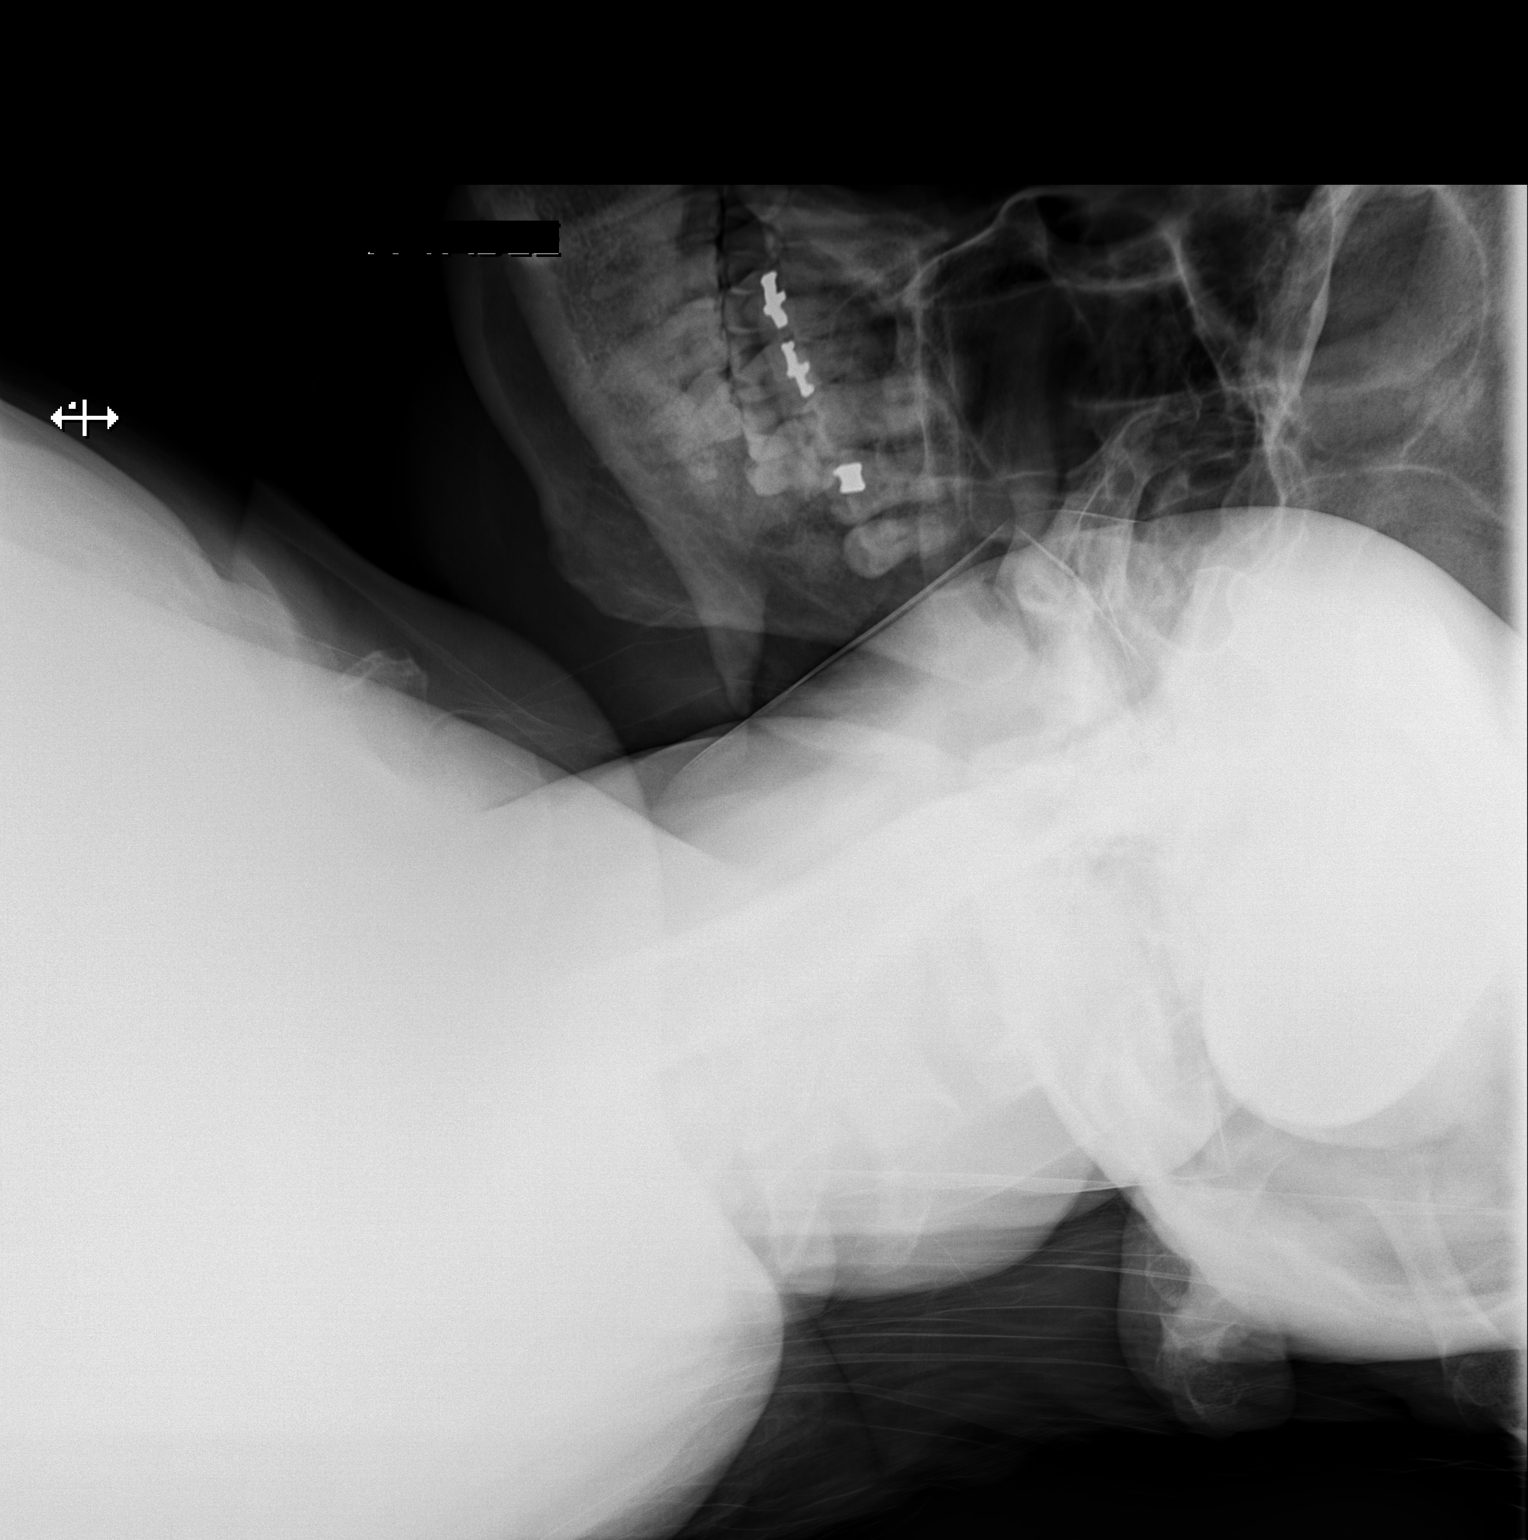

[w thoracic spine lat (2 of 2)]
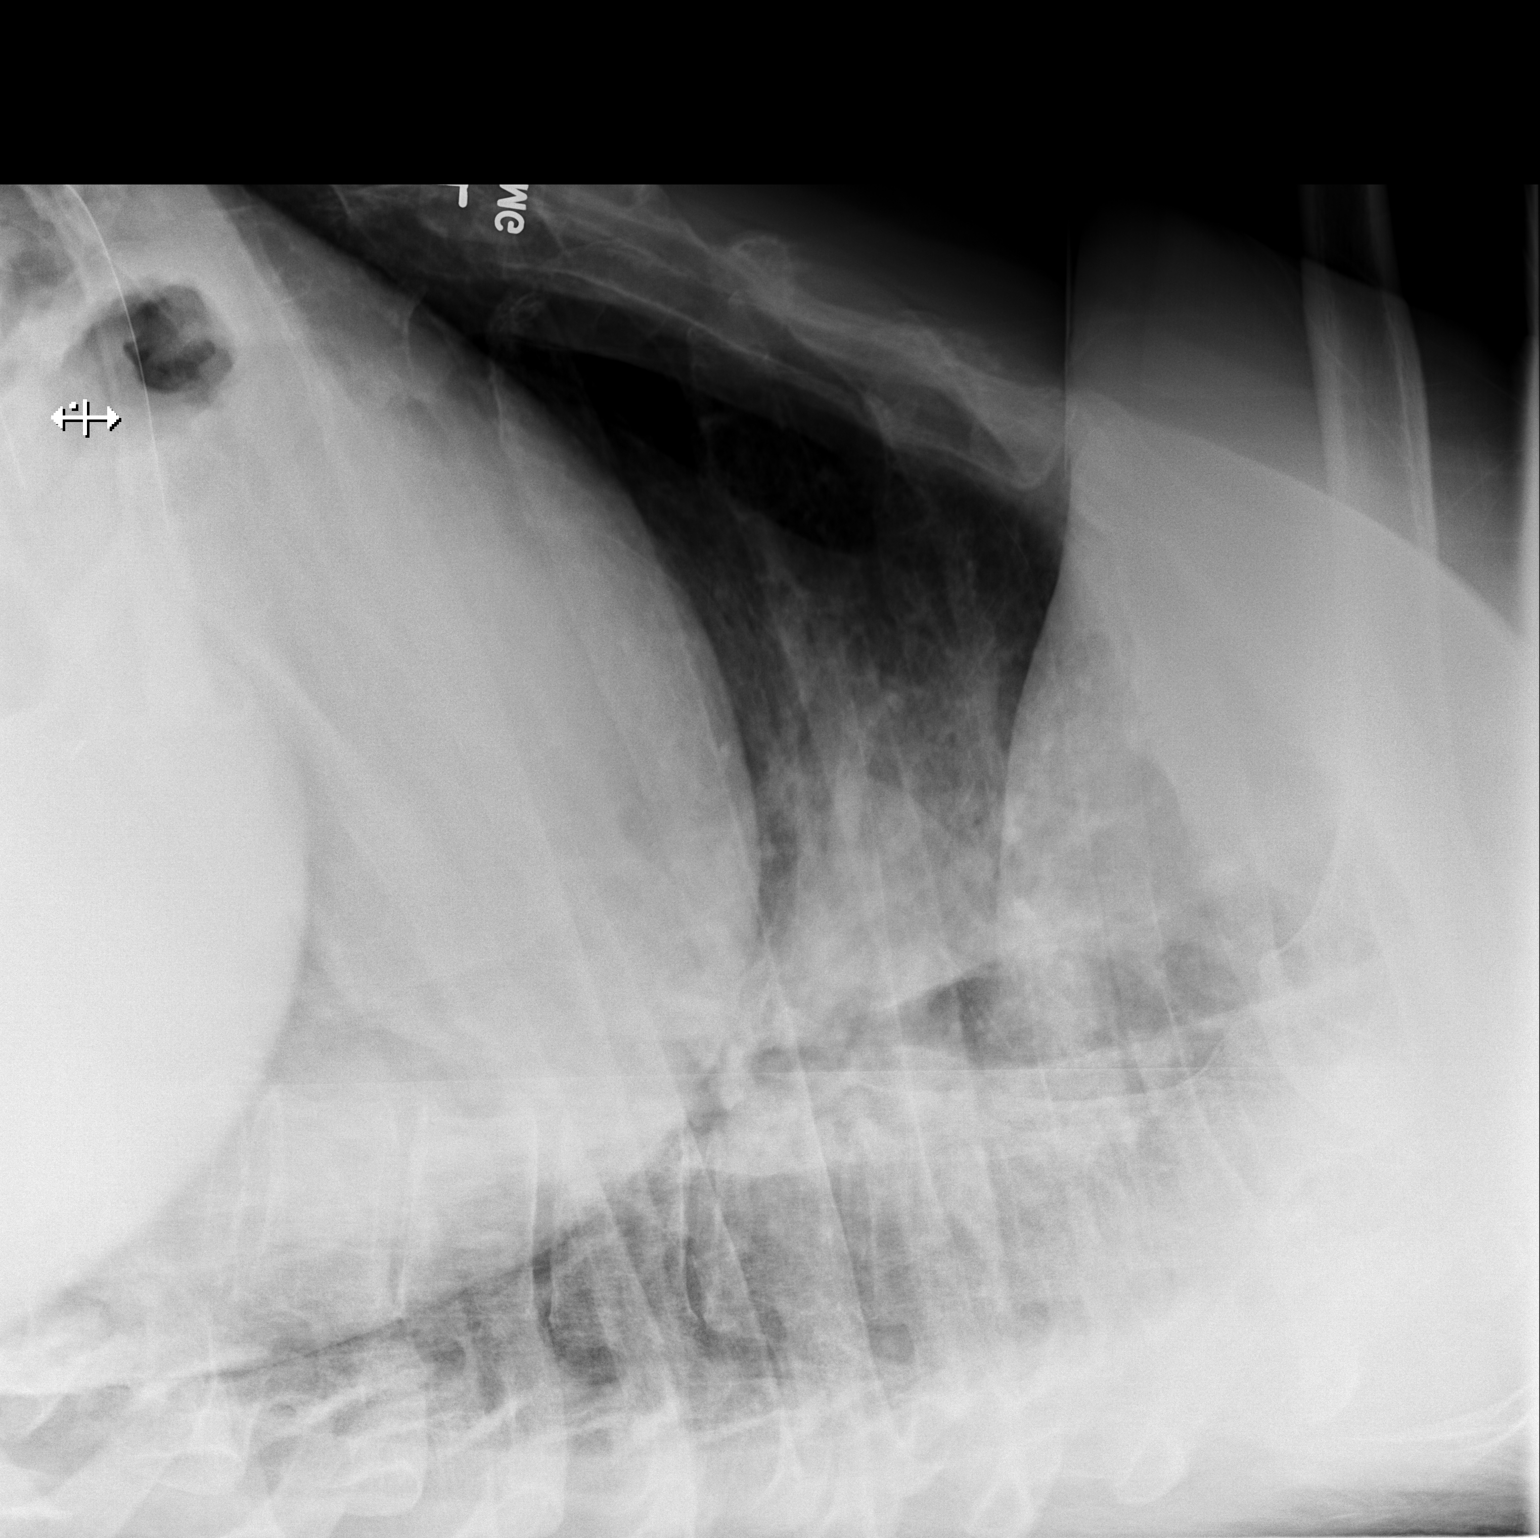

[3 of 3 positions shown; findings below may reference images not displayed]

FINDINGS: Mild dextroscoliosis. Limited lateral views of the upper thoracic
spine. Imaged mid and lower vertebral bodies demonstrate grossly
normal stature.
IMPRESSION: No definite acute osseous abnormality allowing for limited
visualization of upper thoracic vertebra on lateral view

## 2018-06-18 IMAGING — CT CT HEAD W/O CM
3 series · 16 of 47 positions shown, 19 images · non-contrast
Comparison: 01/28/2018

CLINICAL DATA: Headache.  Fell on floor.

EXAM:
CT HEAD WITHOUT CONTRAST
TECHNIQUE: Contiguous axial images were obtained from the base of the skull
through the vertex without intravenous contrast.

[Series 2: head wo · axial · 0.48mm/px · z∈[-136,+4]mm · 10 of 34 slices shown, 13 images]
[im 3/34  brain]
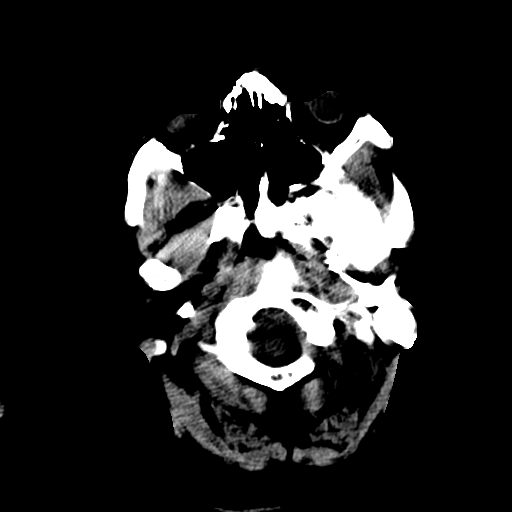
[im 3/34  bone]
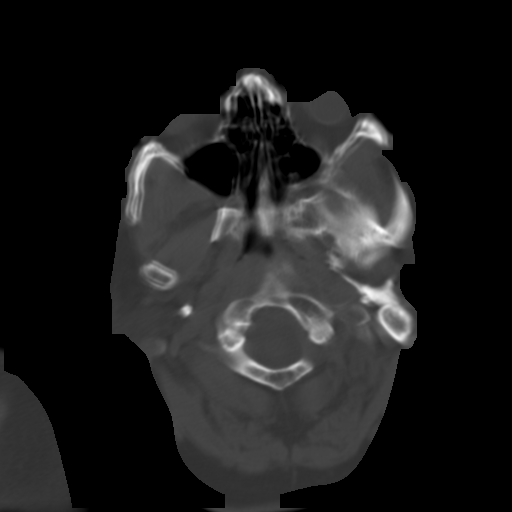
[im 6/34  brain]
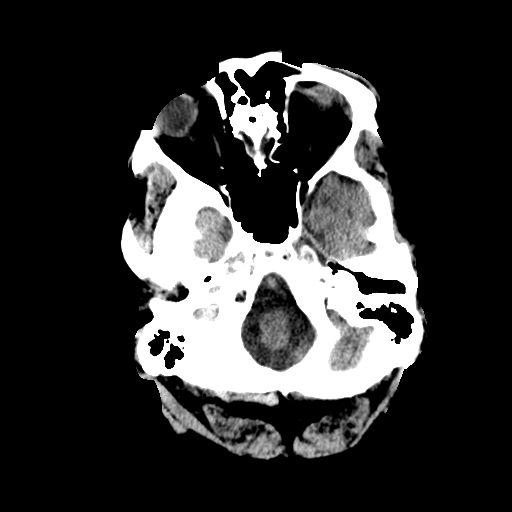
[im 10/34  brain]
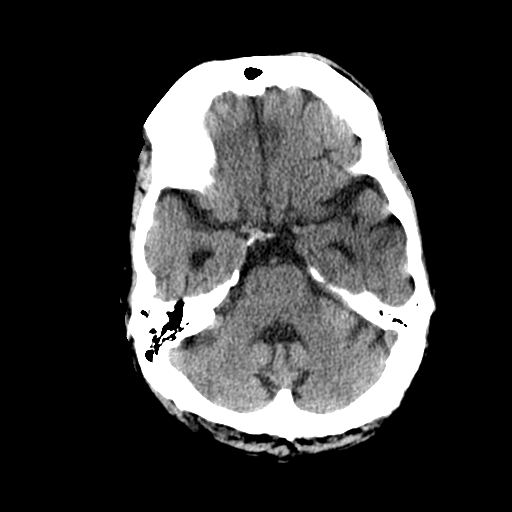
[im 12/34  brain]
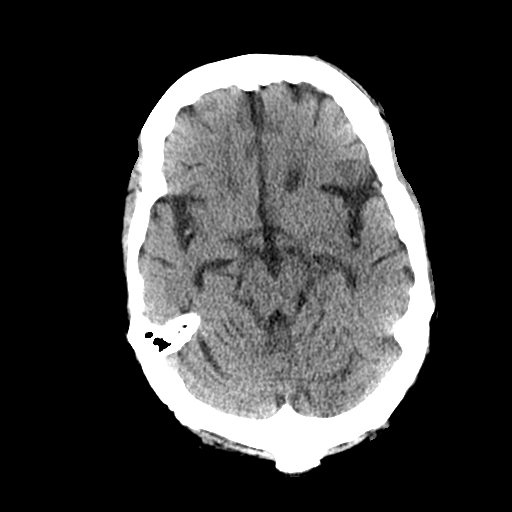
[im 15/34  brain]
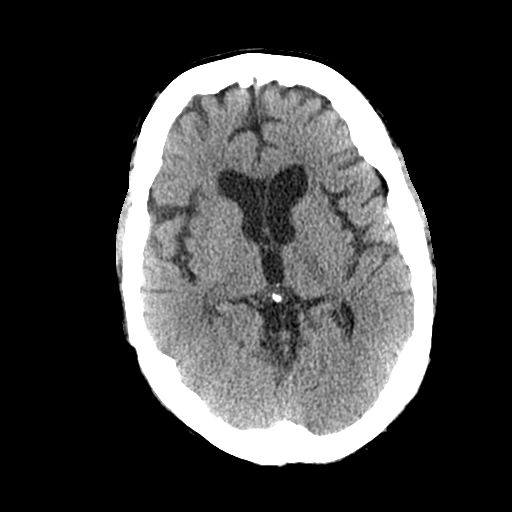
[im 15/34  bone]
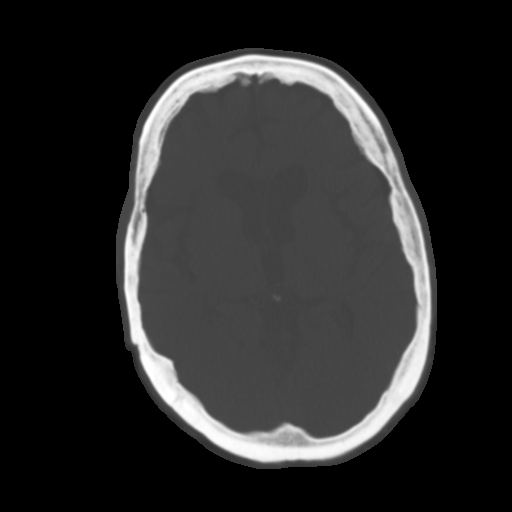
[im 19/34  brain]
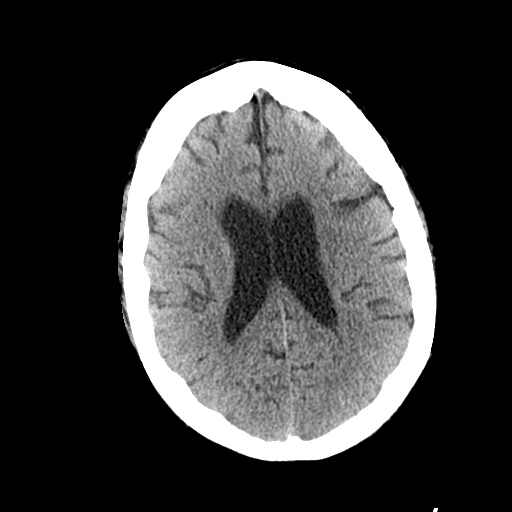
[im 22/34  brain]
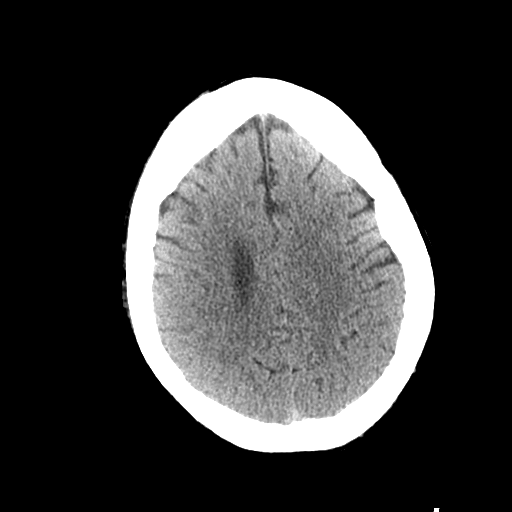
[im 26/34  brain]
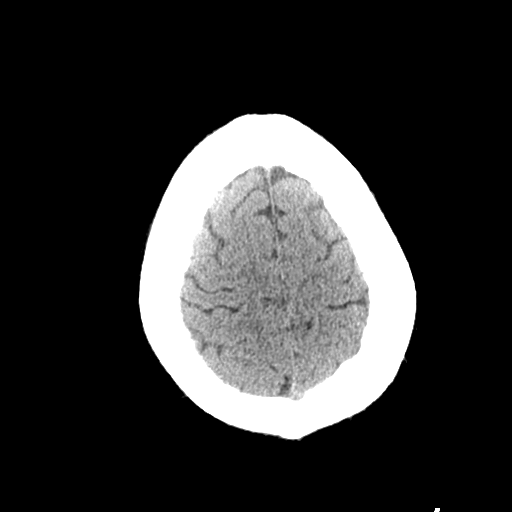
[im 28/34  brain]
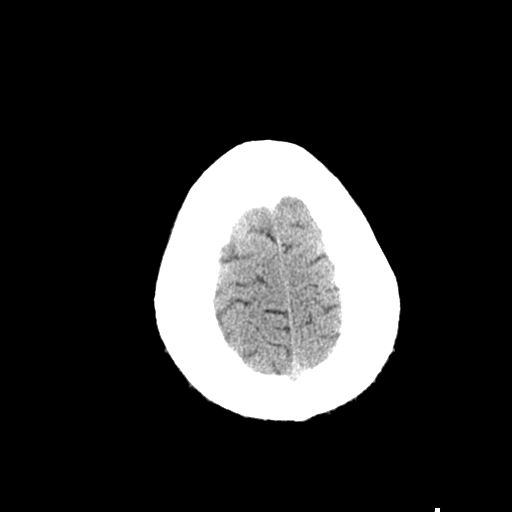
[im 28/34  bone]
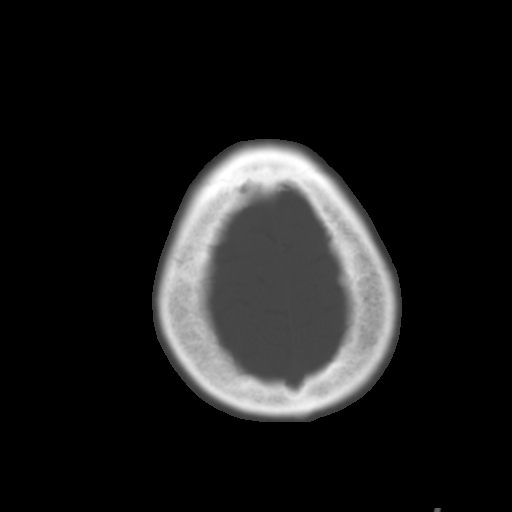
[im 31/34  brain]
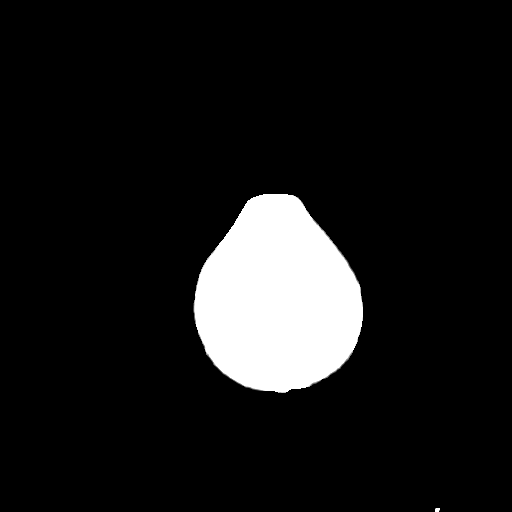

[Series 5: coronal soft tissue · coronal · 0.32mm/px · 3 of 69 slices shown]
[im 23/69  brain]
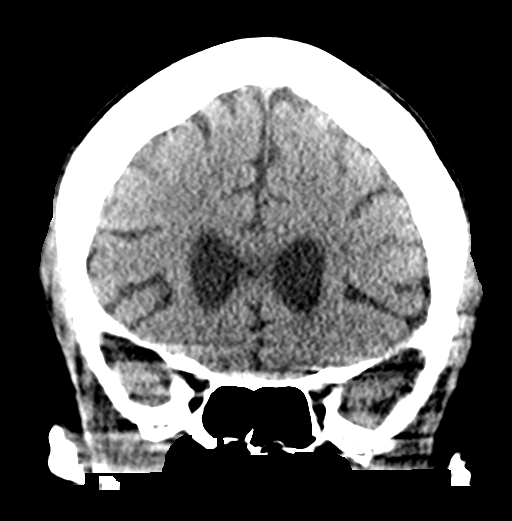
[im 31/69  brain]
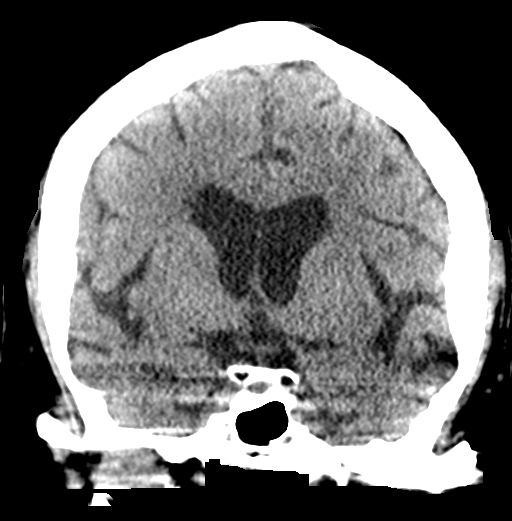
[im 38/69  brain]
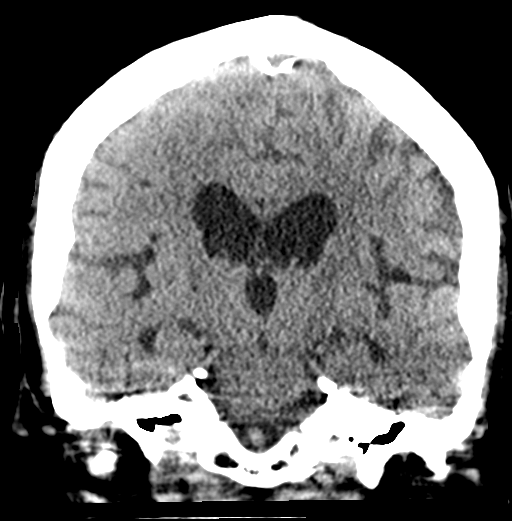

[Series 6: sagittal soft tissue · sagittal · 0.33mm/px · 3 of 57 slices shown]
[im 19/57  brain]
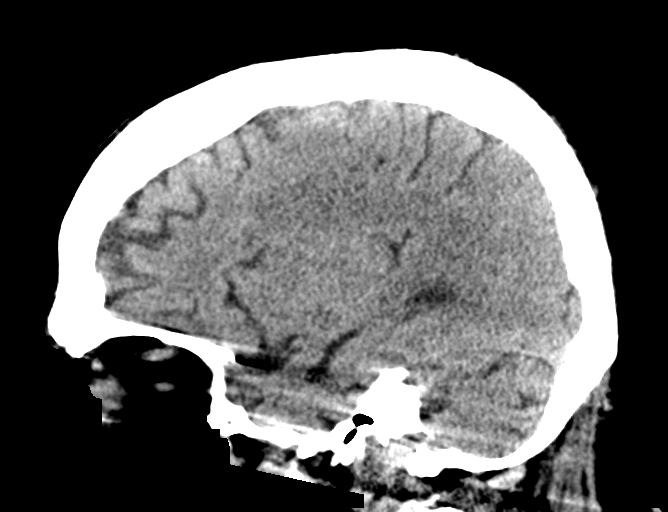
[im 29/57  brain]
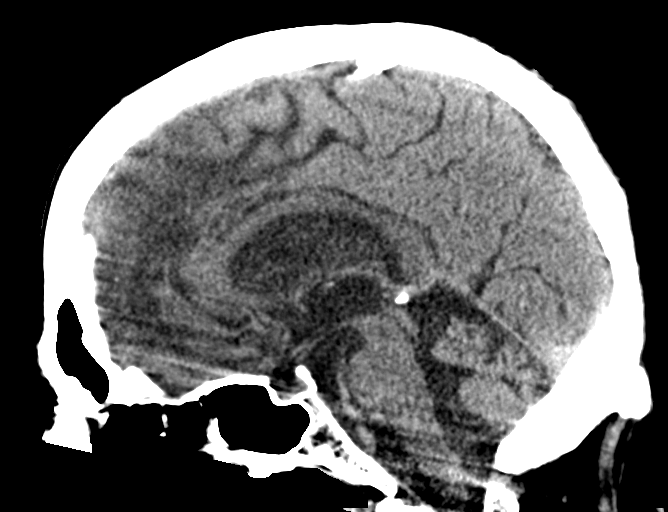
[im 38/57  brain]
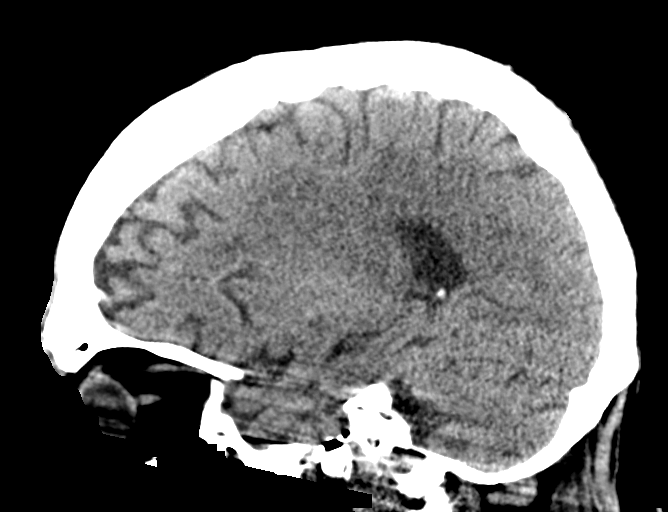

[16 of 47 positions shown; findings below may reference images not displayed]

FINDINGS: Brain: Cerebral and cerebellar atrophy for is age advanced. No mass
lesion, hemorrhage, hydrocephalus, acute infarct, intra-axial, or
extra-axial fluid collection.

Vascular: No hyperdense vessel or unexpected calcification.

Skull: Mild motion degradation. No definite soft tissue swelling. No
skull fractures. Hyperostosis frontalis interna

Sinuses/Orbits: Normal imaged portions of the orbits and globes.
Clear paranasal sinuses and mastoid air cells.

Other: None.
IMPRESSION: 1. Mild motion degradation.
2.  No acute intracranial abnormality.
3. Cerebral and cerebellar atrophy.

## 2018-06-18 IMAGING — CR DG ELBOW COMPLETE 3+V*R*
4 series · 4 of 4 positions shown · non-contrast
Comparison: Humerus films of 10/02/2017.

CLINICAL DATA: Pain after multiple falls.

EXAM:
RIGHT ELBOW - COMPLETE 3+ VIEW

[x elbow lat right (1 of 2)]
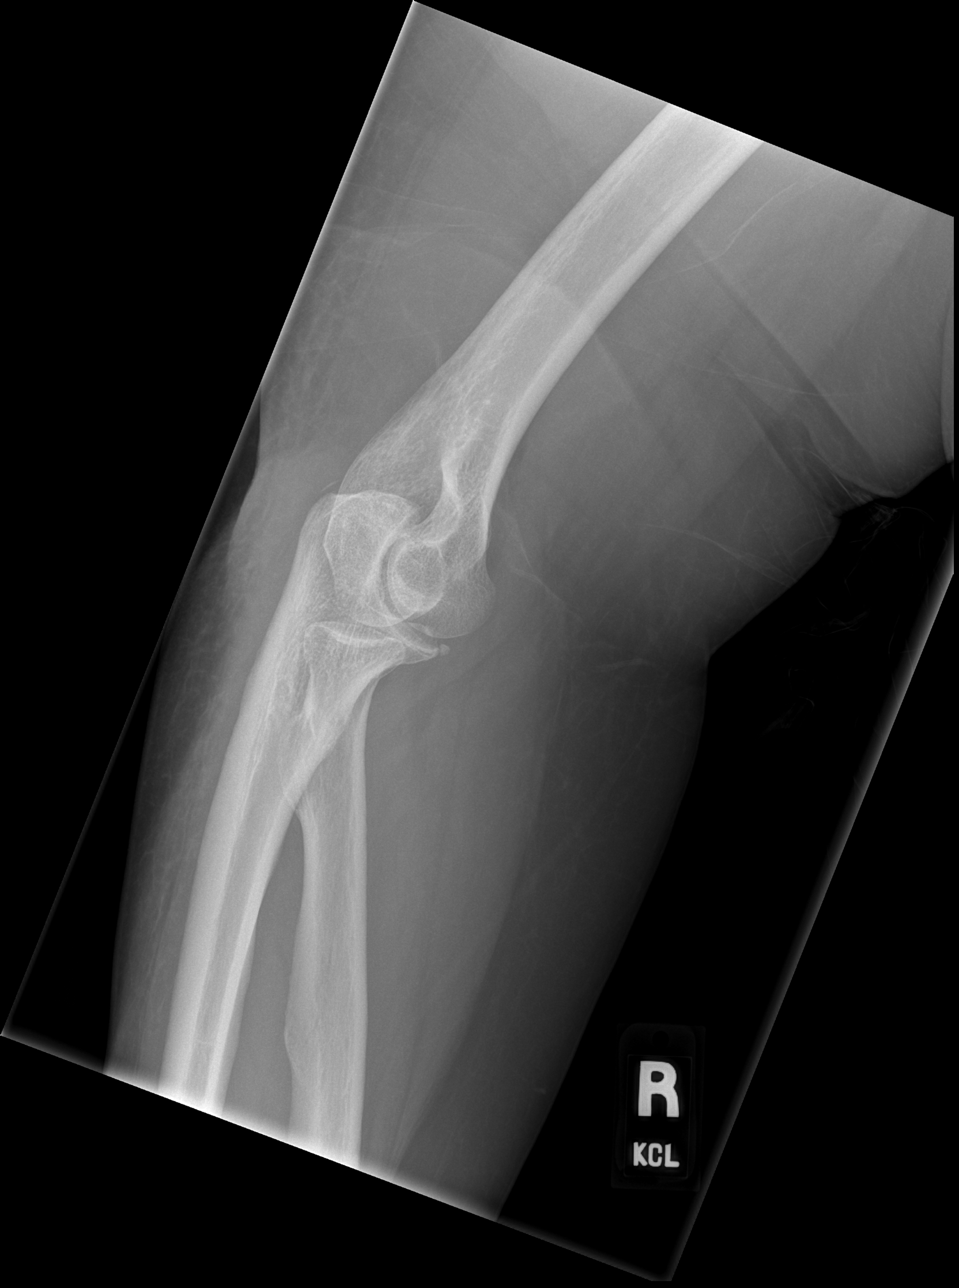

[x elbow ap right]
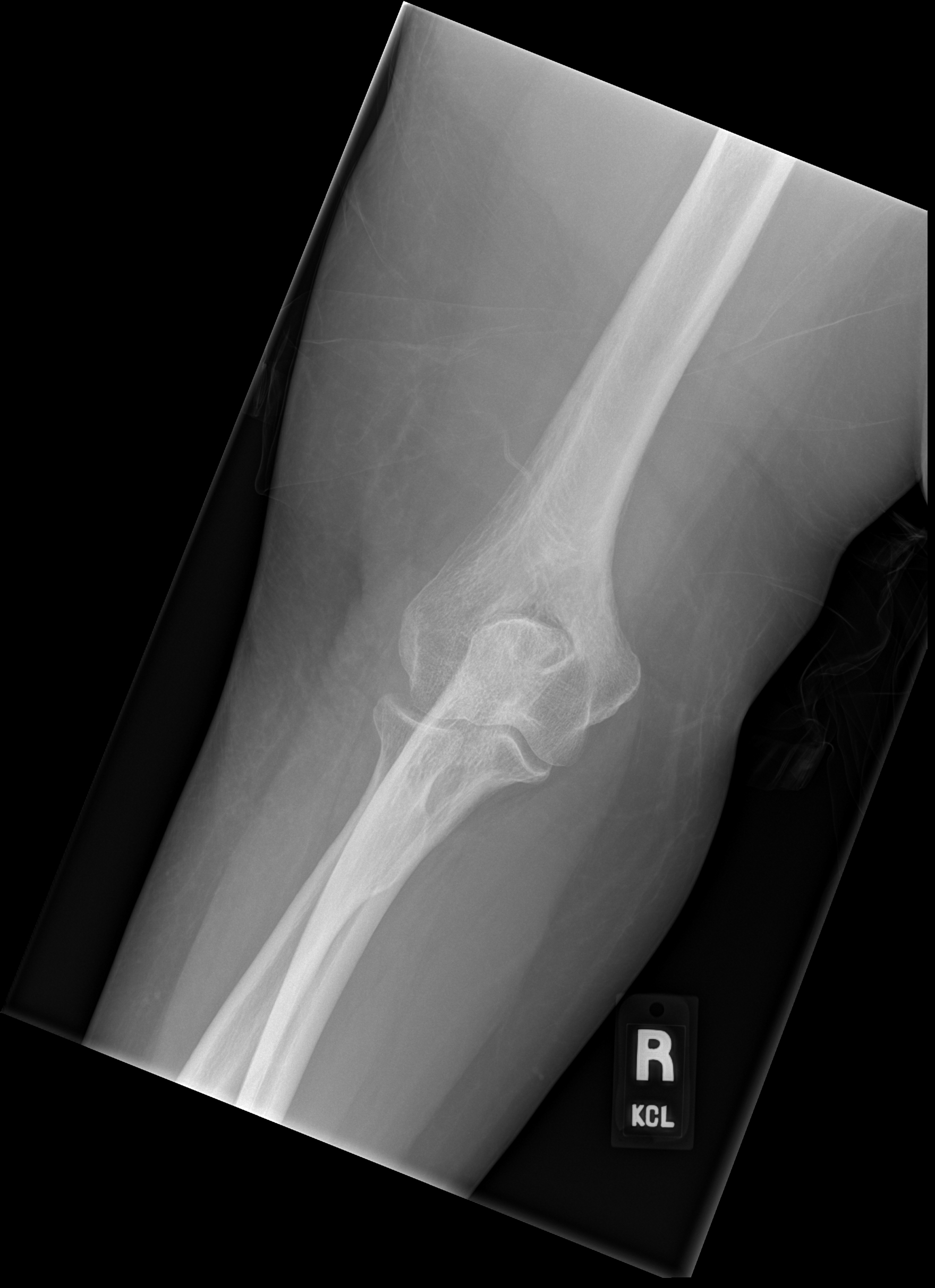

[x elbow lat right (2 of 2)]
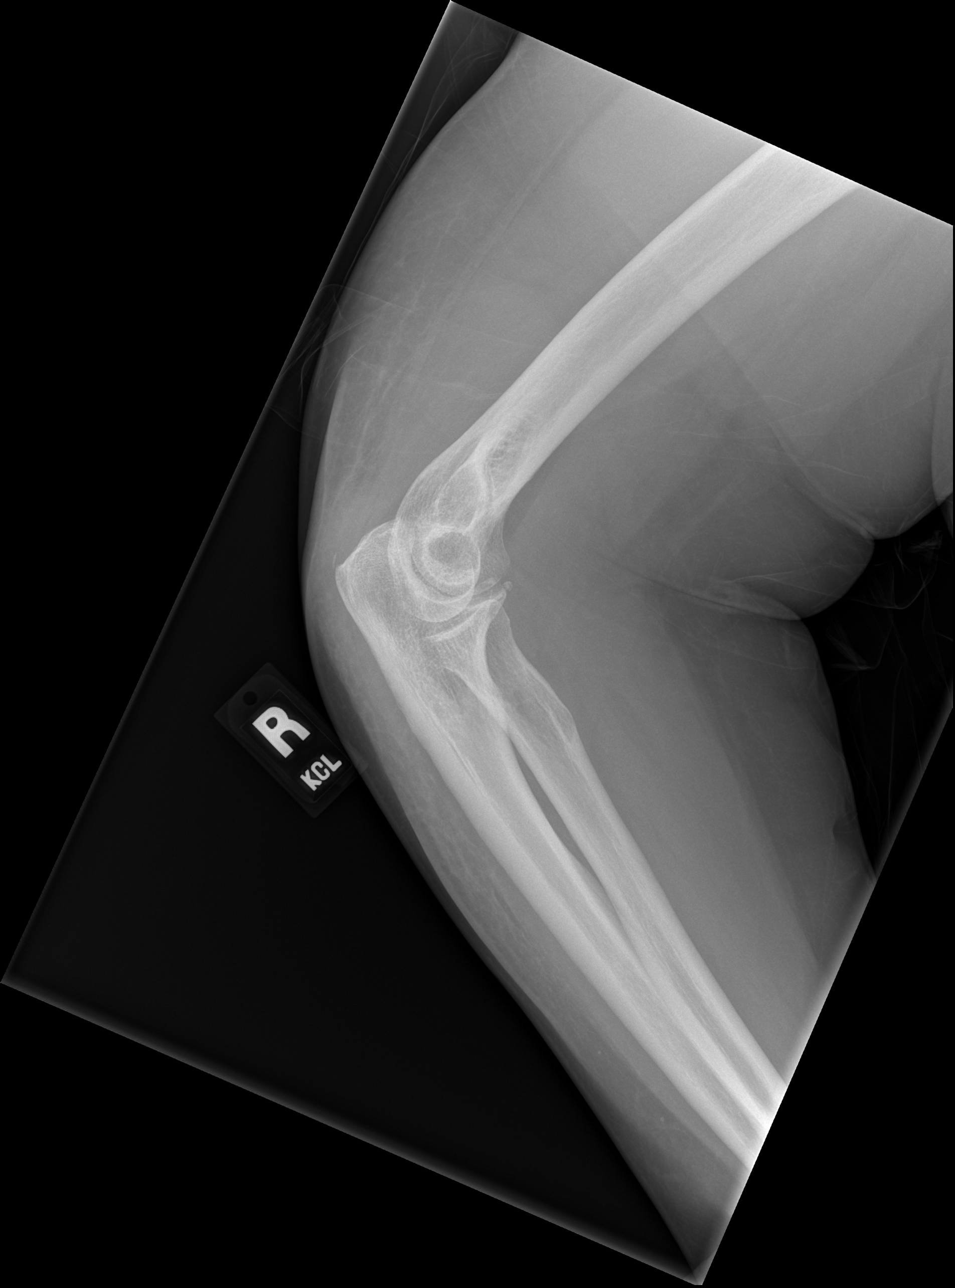

[x elbow obl right]
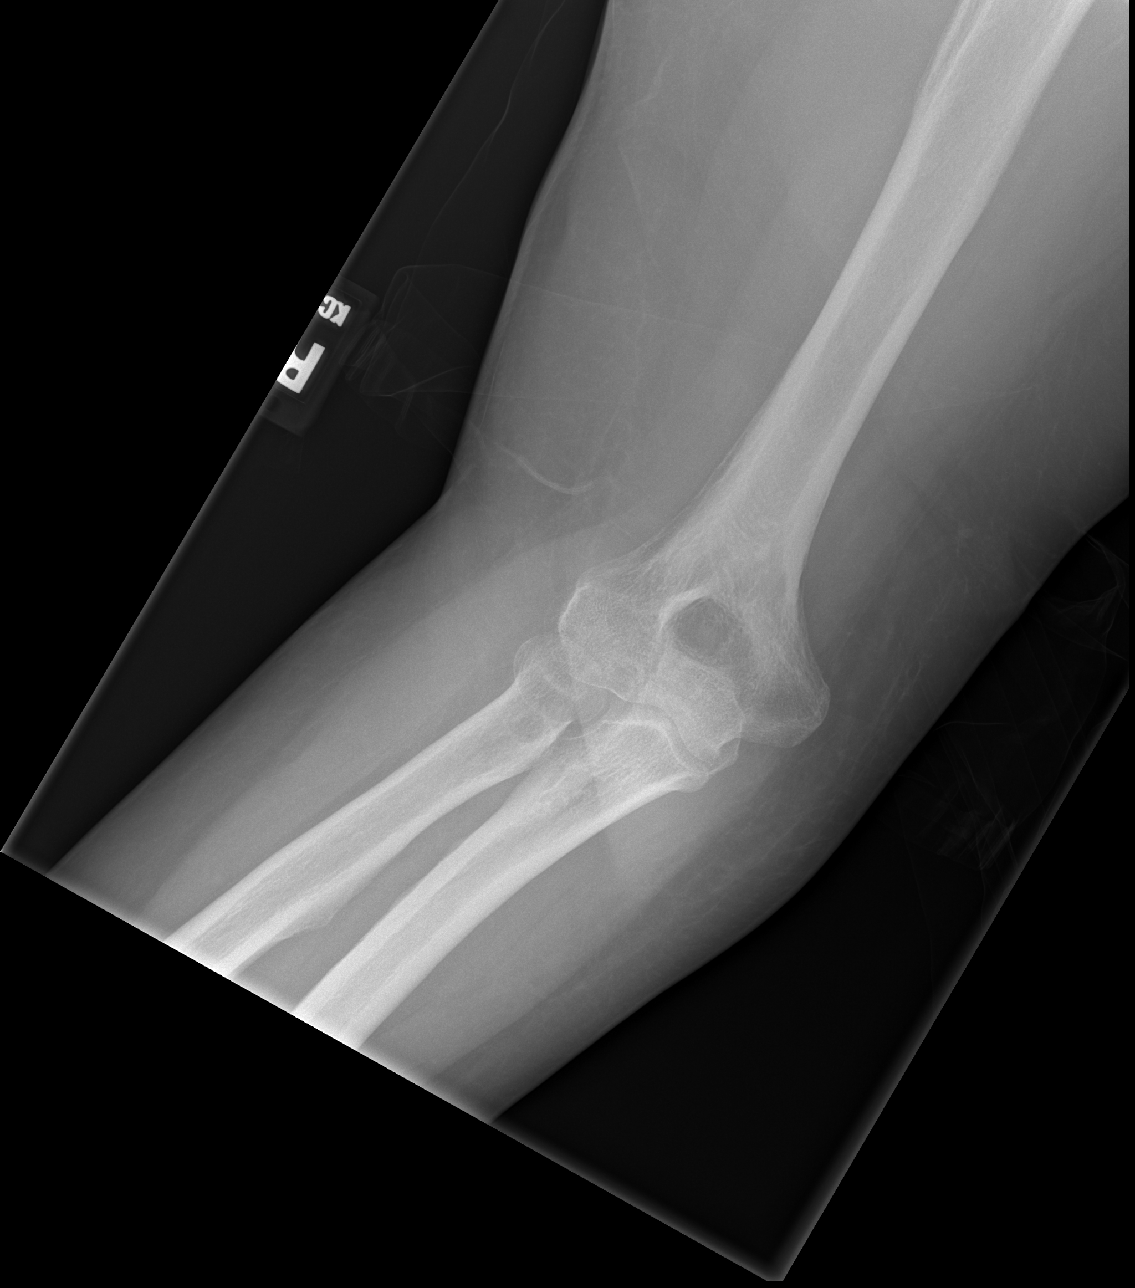

[4 of 4 positions shown; findings below may reference images not displayed]

FINDINGS: Mildly oblique lateral view. Degenerative irregularity about the
coronoid process of the ulna. No acute fracture or dislocation. No
joint effusion.
IMPRESSION: No acute osseous abnormality.

## 2018-06-18 IMAGING — CR DG LUMBAR SPINE COMPLETE 4+V
5 series · 5 of 5 positions shown · non-contrast
Comparison: 02/20/2018, CT 01/25/2017

CLINICAL DATA: Fall with back pain

EXAM:
LUMBAR SPINE - COMPLETE 4+ VIEW

[t lumbar spine ap]
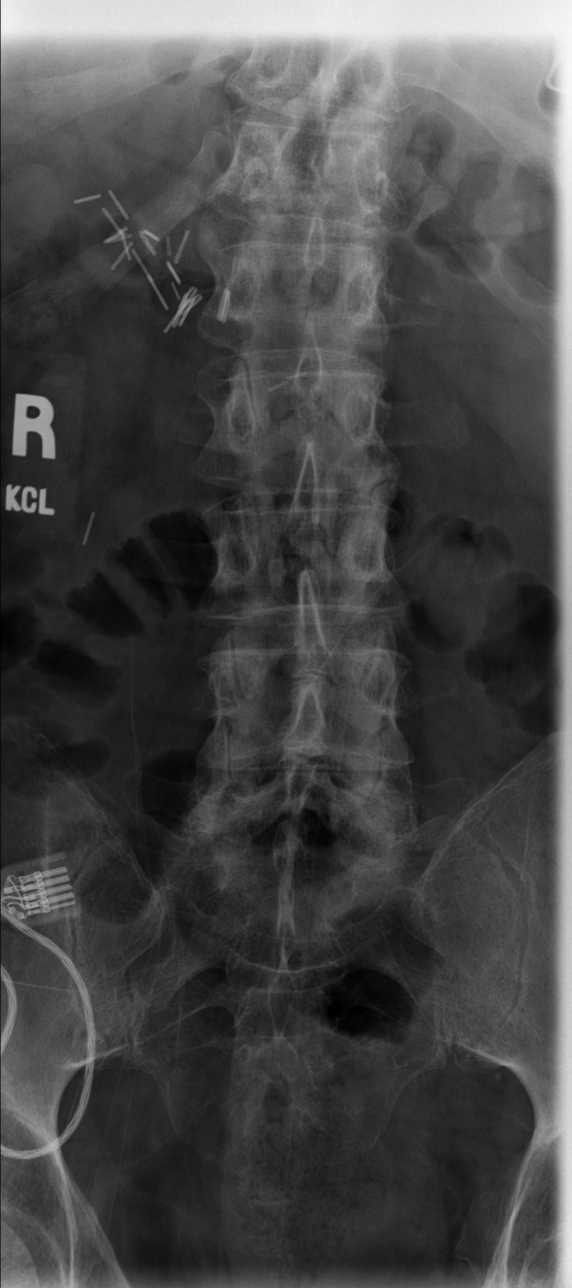

[t lumbar spine obl (1 of 2)]
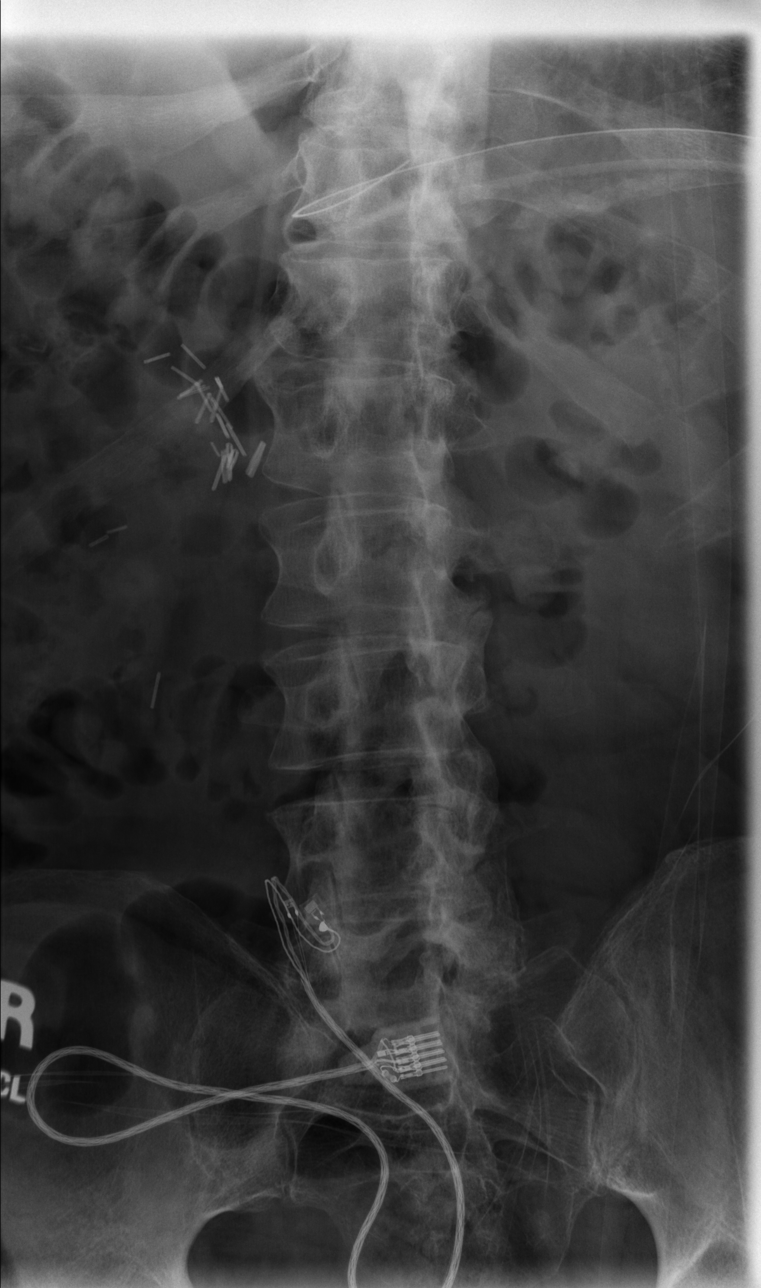

[t lumbar spine obl (2 of 2)]
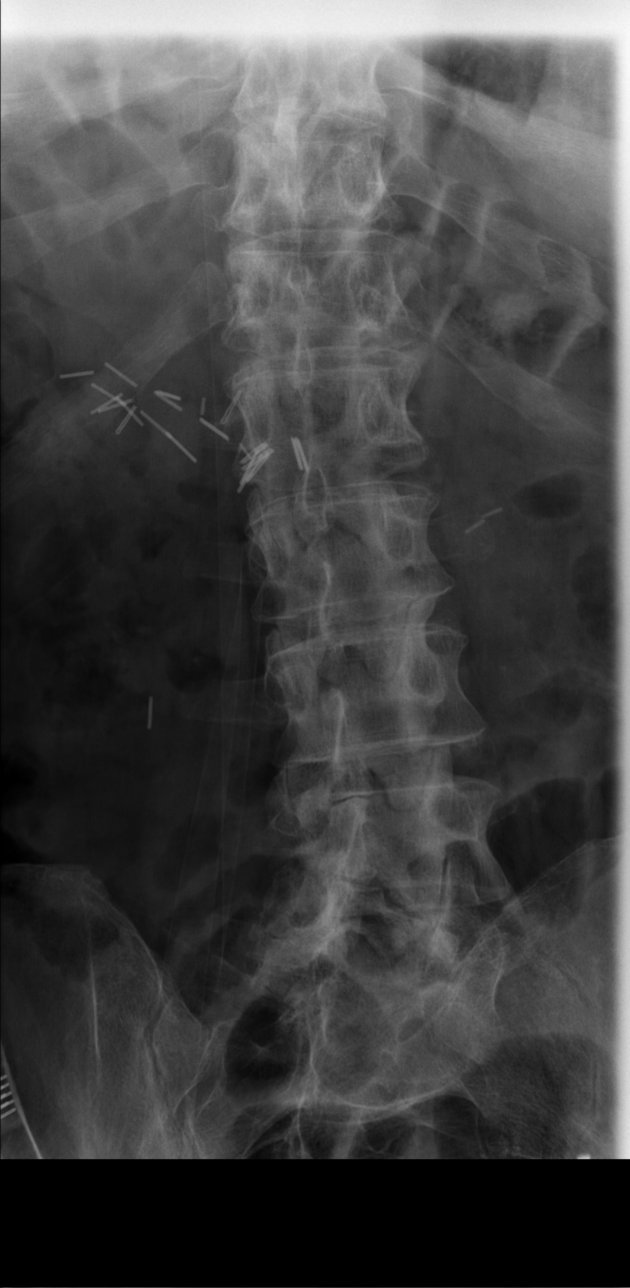

[w lumbar spine lat (1 of 2)]
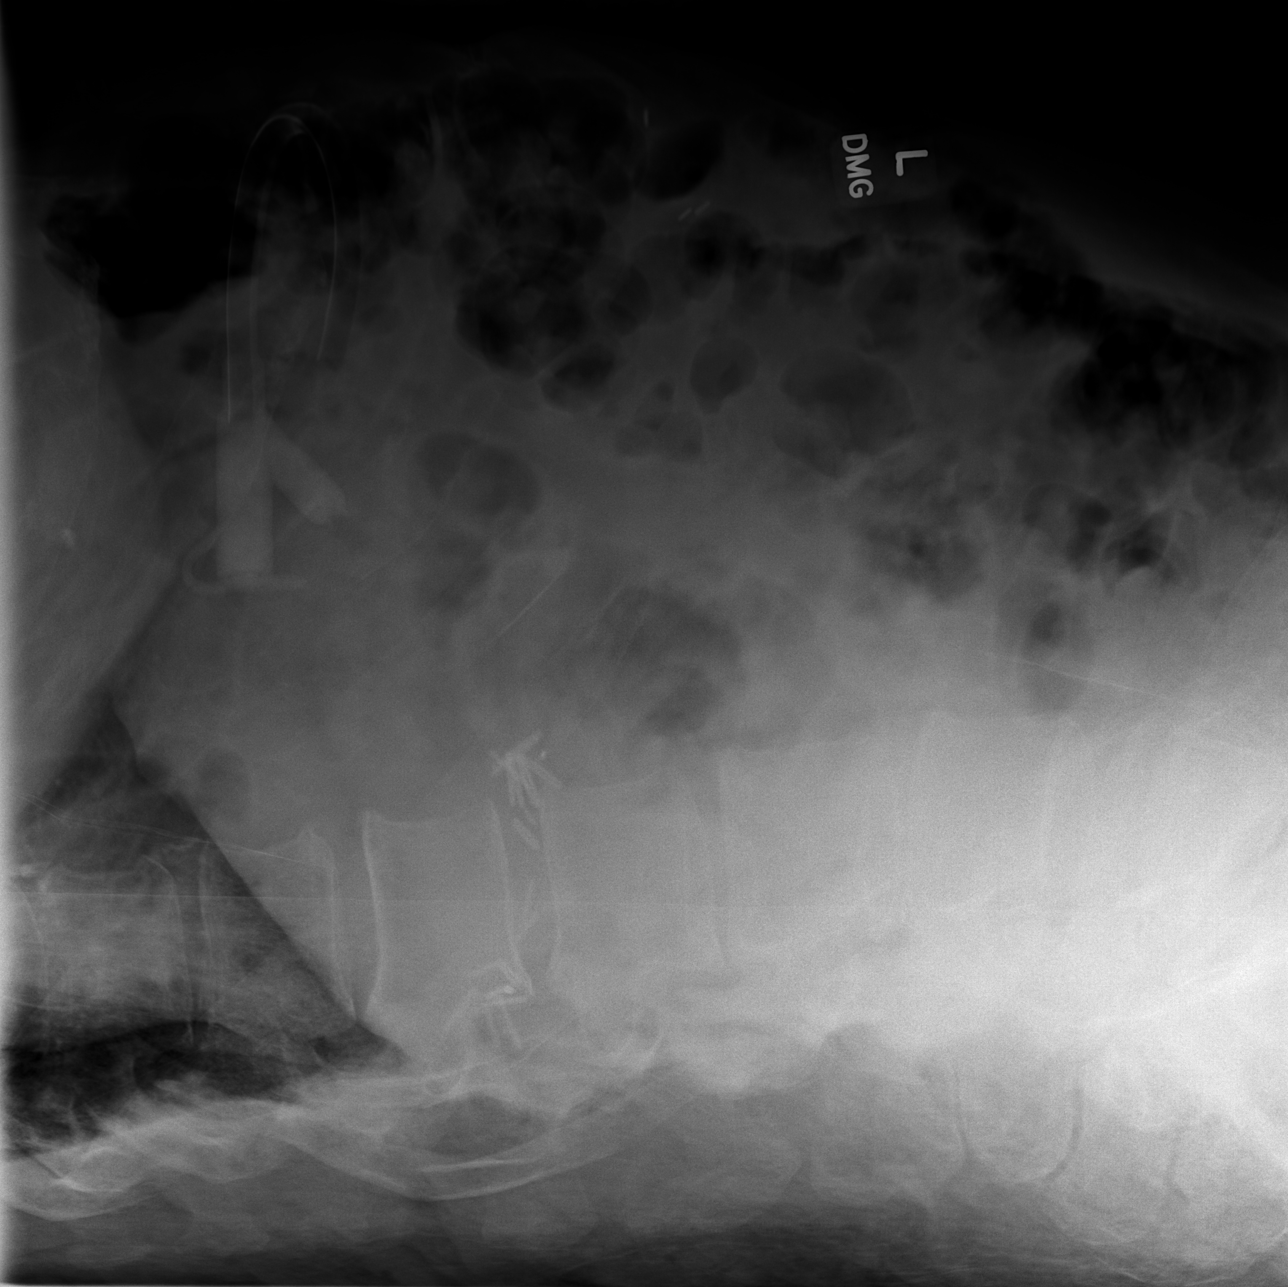

[w lumbar spine lat (2 of 2)]
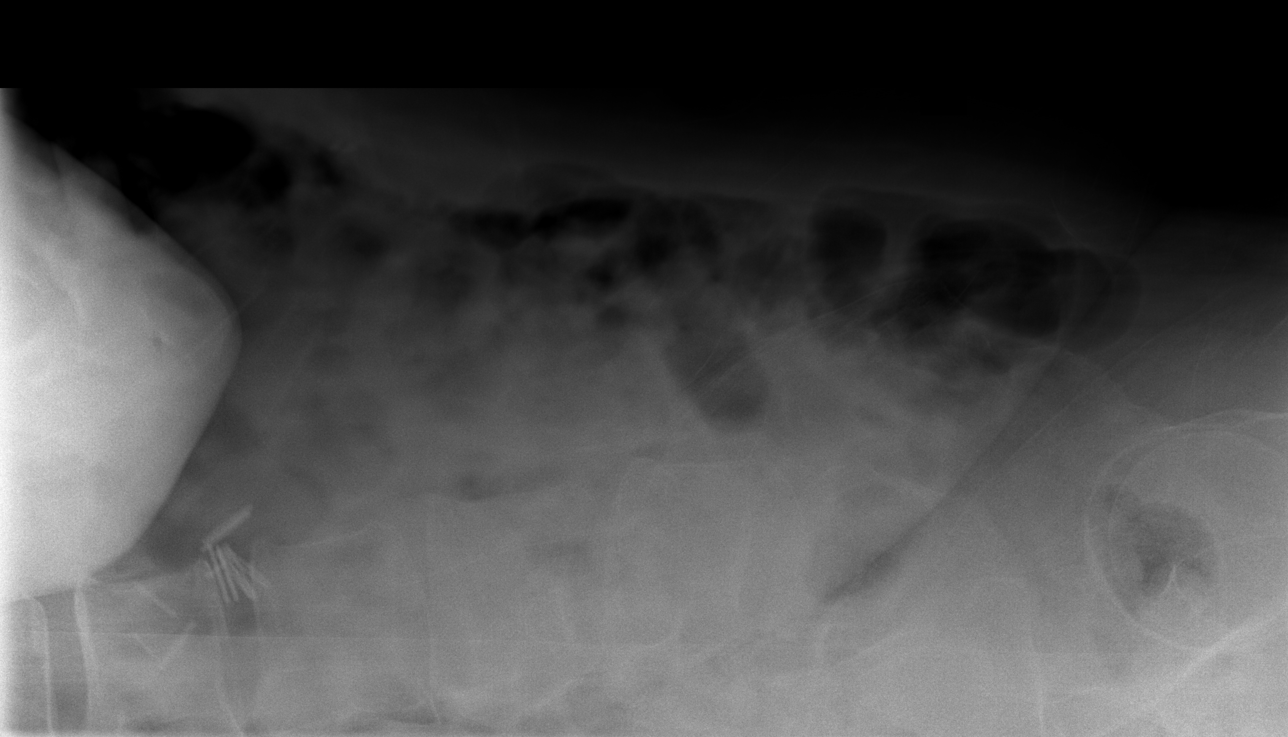

[5 of 5 positions shown; findings below may reference images not displayed]

FINDINGS: Surgical clips and gastrostomy tube in the upper abdomen. Five non
rib-bearing lumbar type vertebra. Suboptimal visualization of the
lumbosacral junction. Lumbar alignment grossly within normal limits.
Vertebral body heights appear maintained. Mild degenerative
narrowing at L5-S1 and L2-L3.
IMPRESSION: No definite acute osseous abnormality allowing for positioning.

## 2018-07-04 ENCOUNTER — Emergency Department (HOSPITAL_COMMUNITY)
Admission: EM | Admit: 2018-07-04 | Discharge: 2018-07-04 | Disposition: A | Discharge status: Home / Self Care | Payer: Medicare Other | Attending: Emergency Medicine | Admitting: Emergency Medicine

## 2018-07-04 ENCOUNTER — Emergency Department (HOSPITAL_COMMUNITY): Payer: Medicare Other

## 2018-07-04 ENCOUNTER — Encounter (HOSPITAL_COMMUNITY): Payer: Self-pay | Admitting: Emergency Medicine

## 2018-07-04 ENCOUNTER — Other Ambulatory Visit: Payer: Self-pay

## 2018-07-04 DIAGNOSIS — Y998 Other external cause status: Secondary | ICD-10-CM | POA: Diagnosis not present

## 2018-07-04 DIAGNOSIS — Z91040 Latex allergy status: Secondary | ICD-10-CM | POA: Diagnosis not present

## 2018-07-04 DIAGNOSIS — S0181XA Laceration without foreign body of other part of head, initial encounter: Secondary | ICD-10-CM

## 2018-07-04 DIAGNOSIS — W01198A Fall on same level from slipping, tripping and stumbling with subsequent striking against other object, initial encounter: Secondary | ICD-10-CM | POA: Insufficient documentation

## 2018-07-04 DIAGNOSIS — G931 Anoxic brain damage, not elsewhere classified: Secondary | ICD-10-CM | POA: Insufficient documentation

## 2018-07-04 DIAGNOSIS — Y9302 Activity, running: Secondary | ICD-10-CM | POA: Diagnosis not present

## 2018-07-04 DIAGNOSIS — E039 Hypothyroidism, unspecified: Secondary | ICD-10-CM | POA: Insufficient documentation

## 2018-07-04 DIAGNOSIS — Z79899 Other long term (current) drug therapy: Secondary | ICD-10-CM | POA: Diagnosis not present

## 2018-07-04 DIAGNOSIS — Z931 Gastrostomy status: Secondary | ICD-10-CM | POA: Diagnosis not present

## 2018-07-04 DIAGNOSIS — S0990XA Unspecified injury of head, initial encounter: Secondary | ICD-10-CM | POA: Diagnosis present

## 2018-07-04 DIAGNOSIS — Y92128 Other place in nursing home as the place of occurrence of the external cause: Secondary | ICD-10-CM | POA: Diagnosis not present

## 2018-07-04 DIAGNOSIS — F0390 Unspecified dementia without behavioral disturbance: Secondary | ICD-10-CM | POA: Diagnosis not present

## 2018-07-04 DIAGNOSIS — Z7982 Long term (current) use of aspirin: Secondary | ICD-10-CM | POA: Insufficient documentation

## 2018-07-04 MED ORDER — LIDOCAINE-EPINEPHRINE (PF) 2 %-1:200000 IJ SOLN
20.0000 mL | Freq: Once | INTRAMUSCULAR | Status: AC
  Administered 2018-07-04: 20 mL
  Filled 2018-07-04: qty 20

## 2018-07-04 NOTE — ED Notes (Signed)
Bed: WA13 Expected date:  Expected time:  Means of arrival:  Comments: EMS 

## 2018-07-04 NOTE — ED Notes (Signed)
[employer] notified of need for transport of pt back to residence.  

## 2018-07-04 NOTE — ED Triage Notes (Signed)
Pt presents by [facility] from [facility] for evaluation of a [facility]. EMS reports that staff stated he got out of bed and tried to run down the hall. Pt was found in hallway by EMS with laceration above left eye and pt reporting back pain.

## 2018-07-04 NOTE — ED Provider Notes (Signed)
[facility]-EMERGENCY DEPT Provider Note   CSN: [id number] Arrival date & time: 07/04/18  0224     History   Chief Complaint Chief Complaint  Patient presents with  . Fall    HPI Newell Wafer is a 52 y.o. male.  52 yo M with a chief complaint of a fall.  Patient has a history of anoxic brain injury and is unable to provide much history.  Level 5 caveat.  Per EMS the nursing home stated that he was running down the hallway and fell.  Noted to have a laceration to the right eyebrow.  Otherwise is at his baseline.  The history is provided by the patient, the EMS personnel and the nursing home.  Fall  This is a recurrent problem. The current episode started less than 1 hour ago. The problem occurs constantly. The problem has not changed since onset.Pertinent negatives include no chest pain, no abdominal pain, no headaches and no shortness of breath. Nothing aggravates the symptoms. Nothing relieves the symptoms. He has tried nothing for the symptoms. The treatment provided no relief.    Past Medical History:  Diagnosis Date  . Addison disease (HCC)   . Anoxic brain injury (HCC)   . Anxiety   . Aspiration pneumonia (HCC)   . Atrial fibrillation (HCC)   . Chronic constipation 11/23/2011  . Dysphagia   . Encephalopathy   . Glaucoma   . Glucocorticoid deficiency (HCC)   . History of recurrent UTIs   . Hyperlipemia   . Hypothyroidism   . Mental disorder   . Myocardial infarction (HCC)   . Pneumoperitoneum of unknown etiology 12/21/2011  . Quadriplegia (HCC)   . Reflux   . Seizure disorder [facility])     Patient Active Problem List   Diagnosis Date Noted  . PEG Gastrostomy tube in place (HCC) 01/26/2018  . 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 01/26/2018 01/26/2018  . Bedridden 01/26/2018  . Quadriplegia (HCC)   . Abdominal wall abscess 01/25/2018  . Fracture of humerus anatomical neck, right, closed, initial encounter 10/03/2017  . AKI  (acute kidney injury) (HCC) 10/03/2017  . UTI (urinary tract infection) 10/03/2017  . Agitation 07/03/2014  . Cardiomyopathy, ischemic 06/18/2014  . 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 Acute psychosis (HCC) 04/19/2014  . Unspecified hypothyroidism 05/16/2013  . Constipation 05/16/2013  . Anemia of other chronic disease 05/16/2013  . Mental disorder 01/03/2013  . Addisons disease (HCC) 01/03/2013  . PEG (percutaneous endoscopic gastrostomy) adjustment/replacement/removal (HCC) 12/21/2011  . Hypokalemia 11/25/2011  . Thrombocytopenia (HCC) 11/25/2011  . FTT (failure to thrive) in adult 11/25/2011  . Grand mal seizure (HCC) 11/23/2011  . Depression (emotion) 11/23/2011  . History of atrial fibrillation 11/23/2011  . Other specified hemorrhagic conditions (HCC) 11/23/2011  . Glaucoma 11/23/2011  . Hypoglycemia 11/23/2011  . Chronic constipation 11/23/2011  . Anoxic brain injury (HCC) 11/12/2011  . Protein calorie malnutrition (HCC) 11/12/2011  . Hyperchloremia 11/12/2011  . Hyperthyroidism 11/12/2011  . Acute lower UTI 11/12/2011  . Dysphagia     Past Surgical History:  Procedure Laterality Date  . DEBRIDEMENT OF ABDOMINAL WALL ABSCESS  01/26/2018  . HERNIA MESH REMOVAL  01/26/2018   Infected mesh - removed w I&D abd wall abscess  . IR GENERIC HISTORICAL  10/20/2016   IR REPLC GASTRO/COLONIC TUBE PERCUT W/FLUORO 10/20/2016 Oley Balm, MD [facility]-INTERV RAD  . IR REPLACE G-TUBE SIMPLE WO FLUORO  01/28/2018  . IR REPLC GASTRO/COLONIC TUBE PERCUT W/FLUORO  04/21/2017  .  IR REPLC GASTRO/COLONIC TUBE PERCUT W/FLUORO  07/22/2017  . IR REPLC GASTRO/COLONIC TUBE PERCUT W/FLUORO  11/24/2017  . IRRIGATION AND DEBRIDEMENT ABSCESS N/A 01/26/2018   Procedure: IRRIGATION AND DEBRIDEMENT ABDOMINAL WALL ABSCESS WITH REMOVAL OF INFECTED MESH;  Surgeon: Karie Soda, MD;  Location: [facility] ORS;  Service: General;  Laterality: N/A;  . NEPHRECTOMY  unknown  . PEG TUBE PLACEMENT          Home  Medications    Prior to Admission medications   Medication Sig Start Date End Date Taking? Authorizing Provider  acetaminophen (TYLENOL) 325 MG tablet Place 650 mg into feeding tube every 4 (four) hours as needed for mild pain.   Yes [provider]  Amino Acids-Protein Hydrolys (FEEDING SUPPLEMENT, PRO-STAT SUGAR FREE 64,) LIQD Take 30 mLs by mouth 2 (two) times daily.   Yes [provider]  aspirin EC 81 MG tablet Take 1 tablet (81 mg total) by mouth daily. With food 01/30/18 01/30/19 Yes Emokpae, Courage, MD  divalproex (DEPAKOTE SPRINKLE) 125 MG capsule Take 500 mg by mouth 2 (two) times daily.  07/18/13  Yes [provider]  levothyroxine (SYNTHROID, LEVOTHROID) 150 MCG tablet Take 150 mcg by mouth daily before breakfast.   Yes [provider]  METOPROLOL SUCCINATE ER PO 12.5 mg by PEG Tube route 2 (two) times daily. Metoprolol tartrate 6.25 mg/5 ml suspension   Yes [provider]  Multiple Vitamins-Minerals (CERTAVITE/ANTIOXIDANTS) LIQD 15 mLs by PEG Tube route daily at 12 noon.    Yes [provider]  Nutritional Supplements (VITAL 1.5 CAL PO) Take 237 mLs by mouth as needed (For less than 50% food consumption.).   Yes [provider]  pantoprazole sodium (PROTONIX) 40 mg/20 mL PACK 40 mg by PEG Tube route daily at 12 noon.  11/30/11  Yes [name], MD  PARoxetine (PAXIL) 20 MG tablet Take 20 mg by mouth daily. 06/03/17  Yes [provider]  polyethylene glycol (MIRALAX / GLYCOLAX) packet 17 g by PEG Tube route 2 (two) times daily. Mix 17gm  In 4-8 ounces of liquid   Yes [provider]  polyvinyl alcohol (LIQUIFILM TEARS) 1.4 % ophthalmic solution Place 1 drop into both eyes 4 (four) times daily.    Yes [provider]  risperiDONE (RISPERDAL M-TABS) 2 MG disintegrating tablet Place 2 mg under the tongue 2 (two) times daily. 05/08/17  Yes [provider]  sennosides (SENOKOT) 8.8 MG/5ML syrup  Take 15 mLs by mouth 2 (two) times daily.   Yes [provider]  traZODone (DESYREL) 50 MG tablet Take 50 mg by mouth 2 (two) times daily.  08/02/13  Yes [provider]  predniSONE (DELTASONE) 10 MG tablet Take with Breakfast......20 mg daily for 4 days, then 10 mg daily for 4 days, then 5 mg daily after that Patient not taking: Reported on 07/04/2018 01/30/18   Shon Hale, MD    Family History History reviewed. No pertinent family history.  Social History Social History   Tobacco Use  . Smoking status: Never Smoker  . Smokeless tobacco: Never Used  Substance Use Topics  . Alcohol use: No  . Drug use: No     Allergies   Ativan [lorazepam]; Latex; Morphine and related; Penicillins; Pyridium [phenazopyridine hcl]; and Soy allergy   Review of Systems Review of Systems  Unable to perform ROS: Dementia  Constitutional: Negative for chills and fever.  HENT: Negative for congestion and facial swelling.   Eyes: Negative for discharge and  visual disturbance.  Respiratory: Negative for shortness of breath.   Cardiovascular: Negative for chest pain and palpitations.  Gastrointestinal: Negative for abdominal pain, diarrhea and vomiting.  Musculoskeletal: Negative for arthralgias and myalgias.  Skin: Negative for color change and rash.  Neurological: Negative for tremors, syncope and headaches.  Psychiatric/Behavioral: Negative for confusion and dysphoric mood.     Physical Exam Updated Vital Signs BP 105/82 (BP Location: Left Arm)   Pulse (!) 58   Temp 98.7 F (37.1 C) (Oral)   Resp 16   Ht 5\' 10"  (1.778 m)   Wt 106.6 kg (235 lb)   SpO2 98%   BMI 33.72 kg/m   Physical Exam  Constitutional: He is oriented to person, place, and time. He appears well-developed and well-nourished.  HENT:  Head: Normocephalic and atraumatic.  2.6 cm laceration to the right eyebrow.  Mildly gaping.  No foreign bodies.  Eyes: Pupils are equal, round, and reactive to light.  EOM are normal.  Neck: Normal range of motion. Neck supple. No JVD present.  Cardiovascular: Normal rate and regular rhythm. Exam reveals no gallop and no friction rub.  No murmur heard. Pulmonary/Chest: No respiratory distress. He has no wheezes.  Abdominal: He exhibits no distension. There is no rebound and no guarding.  Musculoskeletal: Normal range of motion.  Neurological: He is alert and oriented to person, place, and time.  Skin: No rash noted. No pallor.  Psychiatric: He has a normal mood and affect. His behavior is normal.  Nursing note and vitals reviewed.    ED Treatments / Results  Labs (all labs ordered are listed, but only abnormal results are displayed) Labs Reviewed - No data to display  EKG None  Radiology Ct Head Wo Contrast  Result Date: 07/04/2018 CLINICAL DATA:  Head trauma, minor, GCS>=13, high clinical risk, initial exam. EXAM: CT HEAD WITHOUT CONTRAST TECHNIQUE: Contiguous axial images were obtained from the base of the skull through the vertex without intravenous contrast. COMPARISON:  Head CT 05/16/2018, multiple priors FINDINGS: Brain: Unchanged degree of generalized atrophy. Mild chronic small vessel ischemia. No intracranial hemorrhage, mass effect, or midline shift. No hydrocephalus. The basilar cisterns are patent. No evidence of territorial infarct or acute ischemia. No extra-axial or intracranial fluid collection. Vascular: No hyperdense vessel or unexpected calcification. Skull: No fracture or focal lesion. Sinuses/Orbits: Paranasal sinuses are clear. Minimal unchanged opacification of lower left mastoid air cells. The visualized orbits are unremarkable. Other: None. IMPRESSION: 1.  No acute intracranial abnormality. 2. Generalized atrophy, unchanged from prior exam, advanced for age. Electronically Signed   By: Rubye OaksMelanie  Ehinger M.D.   On: 07/04/2018 03:23    Procedures .Marland Kitchen.Laceration Repair Date/Time: 07/04/2018 4:39 AM Performed by: Melene PlanFloyd, Tenaya Hilyer,  DO Authorized by: Melene PlanFloyd, Mont Jagoda, DO   Consent:    Consent obtained:  Verbal   Consent given by:  Patient   Risks discussed:  Infection, pain, poor cosmetic result and poor wound healing   Alternatives discussed:  No treatment Anesthesia (see MAR for exact dosages):    Anesthesia method:  Local infiltration   Local anesthetic:  Lidocaine 2% WITH epi Laceration details:    Location:  Face   Face location:  R eyebrow   Length (cm):  2.6 Repair type:    Repair type:  Simple Pre-procedure details:    Preparation:  Patient was prepped and draped in usual sterile fashion Exploration:    Hemostasis achieved with:  Epinephrine and direct pressure   Wound exploration: entire depth of wound probed  and visualized     Contaminated: no   Treatment:    Area cleansed with:  Saline   Amount of cleaning:  Standard   Irrigation solution:  Sterile saline   Irrigation volume:  20   Irrigation method:  Syringe   Visualized foreign bodies/material removed: no   Skin repair:    Repair method:  Sutures   Suture size:  6-0   Suture material:  Fast-absorbing gut   Suture technique:  Simple interrupted   Number of sutures:  2 Approximation:    Approximation:  Close Post-procedure details:    Dressing:  Open (no dressing)   Patient tolerance of procedure:  Tolerated well, no immediate complications .Suture Removal Date/Time: 07/04/2018 4:40 AM Performed by: Melene Plan, DO Authorized by: Melene Plan, DO   Consent:    Consent obtained:  Verbal   Consent given by:  Patient   Risks discussed:  Bleeding and pain   Alternatives discussed:  No treatment Location:    Location:  Head/neck   Head/neck location:  Eyebrow   Eyebrow location:  R eyebrow Procedure details:    Wound appearance:  No signs of infection   Number of sutures removed:  1   (including critical care time)  Medications Ordered in ED Medications  lidocaine-EPINEPHrine (XYLOCAINE W/EPI) 2 %-1:200000 (PF) injection 20 mL (20 mLs  Infiltration Given 07/04/18 0338)     Initial Impression / Assessment and Plan / ED Course  I have reviewed the triage vital signs and the nursing notes.  Pertinent labs & imaging results that were available during my care of the patient were reviewed by me and considered in my medical decision making (see chart for details).     52 yo M with a significant past medical history of anoxic brain injury comes with a chief complaint of a fall.  Per EMS he was running down the hallway and fell.  Patient has very limited ability to communicate and so a CT scan was performed which is negative.  Patient has a laceration to the right eyebrow which was sutured.  During the procedure it was noted that he had some old 6-0 Prolene that was still in place.  This was removed.  Discharge home.  4:42 AM:  I have discussed the diagnosis/risks/treatment options with the patient and believe the pt to be eligible for discharge home to follow-up with PCP. We also discussed returning to the ED immediately if new or worsening sx occur. We discussed the sx which are most concerning (e.g., sudden worsening pain, fever, inability to tolerate by mouth) that necessitate immediate return. Medications administered to the patient during their visit and any new prescriptions provided to the patient are listed below.  Medications given during this visit Medications  lidocaine-EPINEPHrine (XYLOCAINE W/EPI) 2 %-1:200000 (PF) injection 20 mL (20 mLs Infiltration Given 07/04/18 0338)      The patient appears reasonably screen and/or stabilized for discharge and I doubt any other medical condition or other [facility] requiring further screening, evaluation, or treatment in the ED at this time prior to discharge.    Final Clinical Impressions(s) / ED Diagnoses   Final diagnoses:  Facial laceration, initial encounter    ED Discharge Orders    None       Melene Plan, DO 07/04/18 [phone]

## 2018-08-06 IMAGING — CT CT HEAD W/O CM
3 series · 16 of 47 positions shown, 19 images · non-contrast
Comparison: Head CT 05/16/2018, multiple priors

CLINICAL DATA: Head trauma, minor, GCS>=13, high clinical risk,
initial exam.

EXAM:
CT HEAD WITHOUT CONTRAST
TECHNIQUE: Contiguous axial images were obtained from the base of the skull
through the vertex without intravenous contrast.

[Series 2: head wo · axial · 0.47mm/px · z∈[-112,+23]mm · 10 of 33 slices shown, 13 images]
[im 3/33  brain]
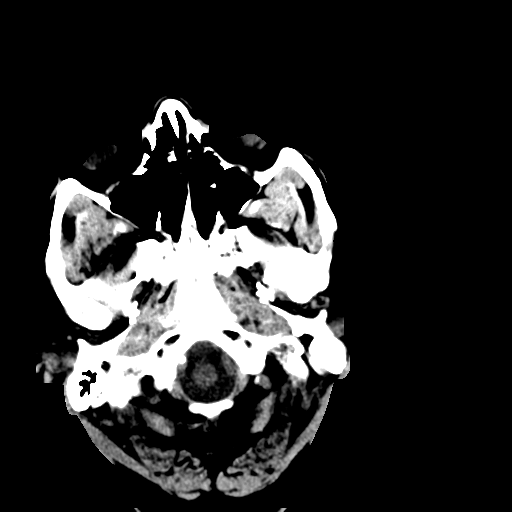
[im 3/33  bone]
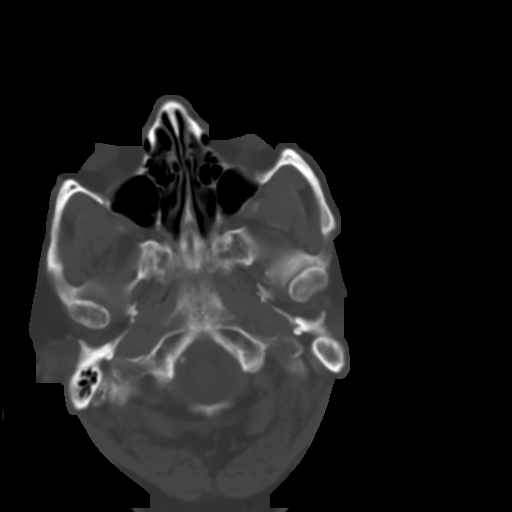
[im 6/33  brain]
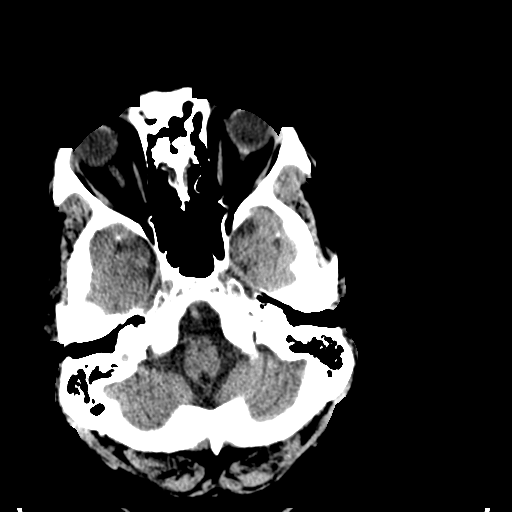
[im 9/33  brain]
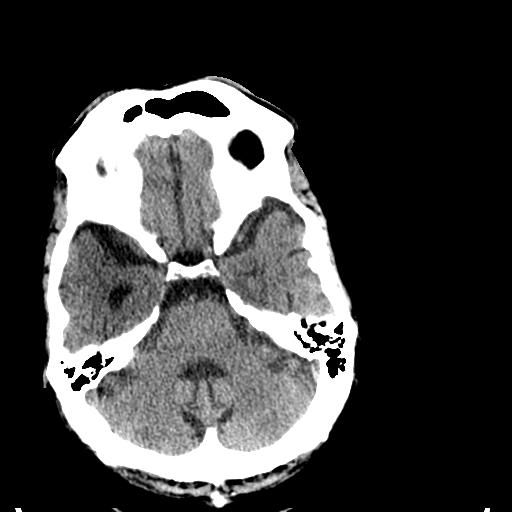
[im 12/33  brain]
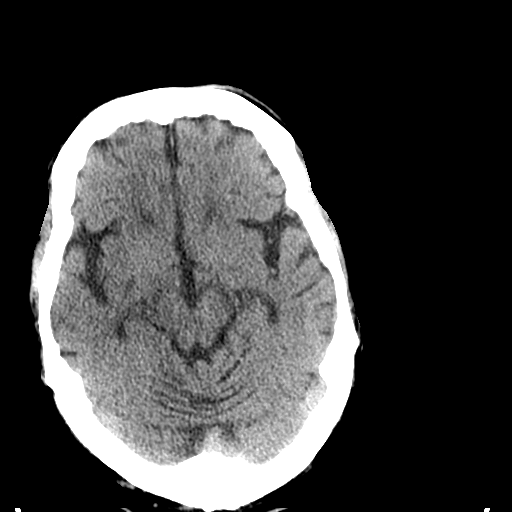
[im 15/33  brain]
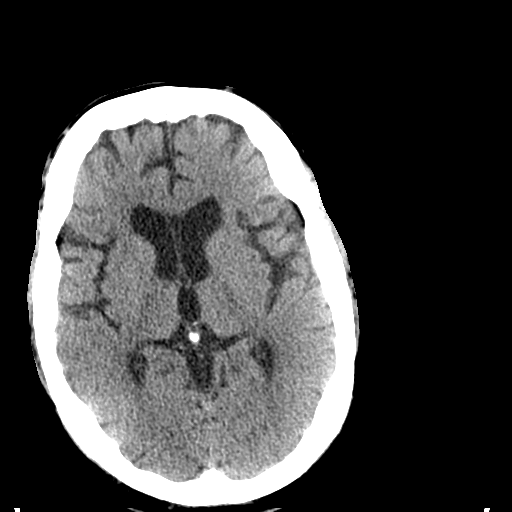
[im 15/33  bone]
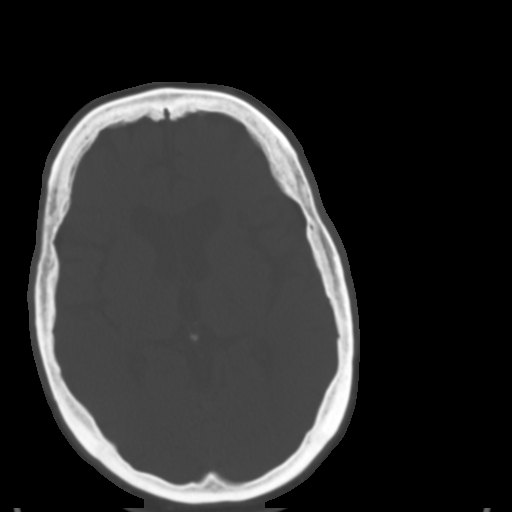
[im 18/33  brain]
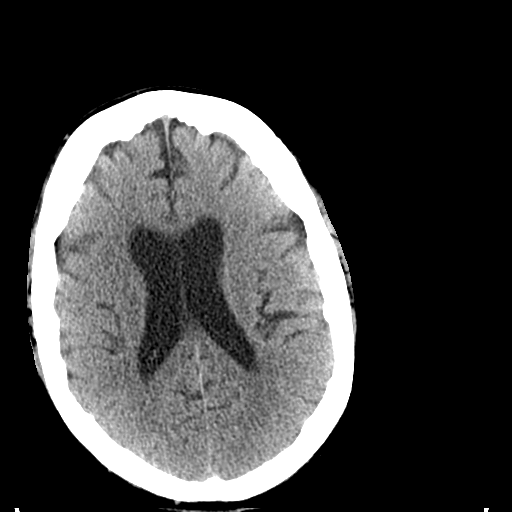
[im 21/33  brain]
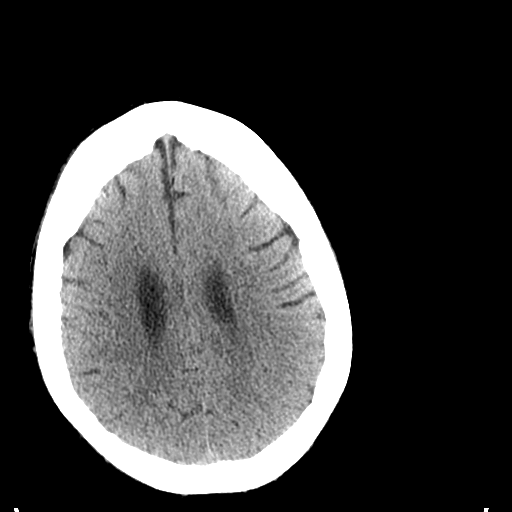
[im 25/33  brain]
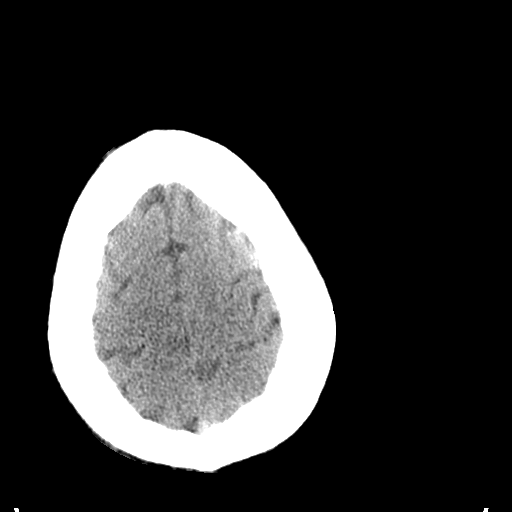
[im 27/33  brain]
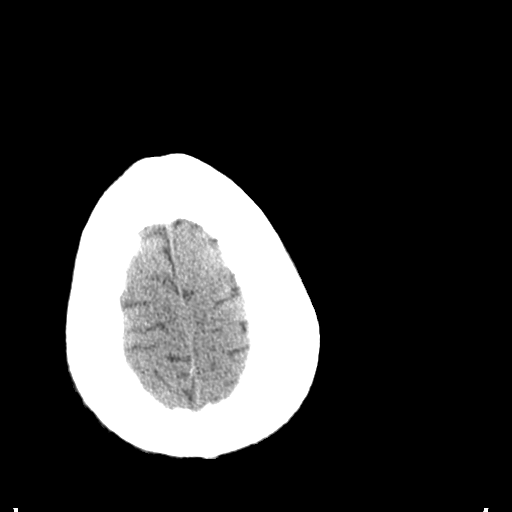
[im 27/33  bone]
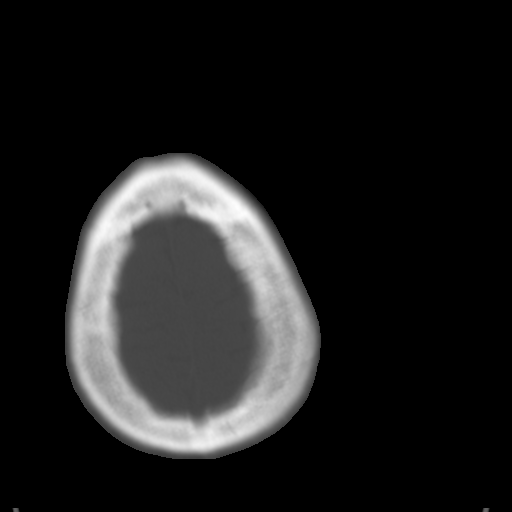
[im 30/33  brain]
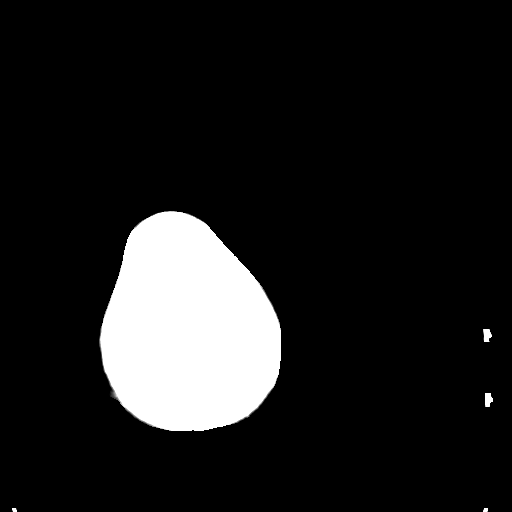

[Series 4: coronal soft tissue · coronal · 0.32mm/px · 3 of 67 slices shown]
[im 23/67  brain]
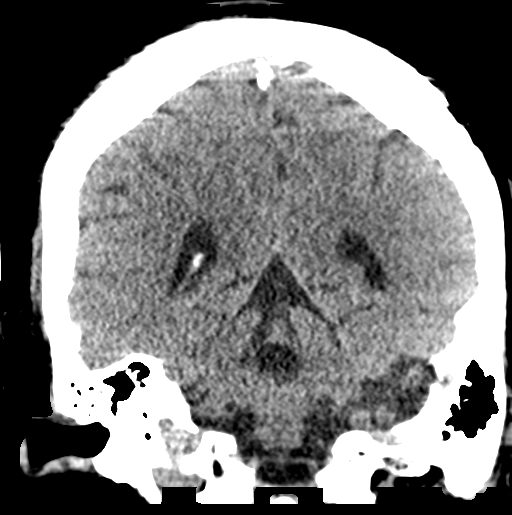
[im 30/67  brain]
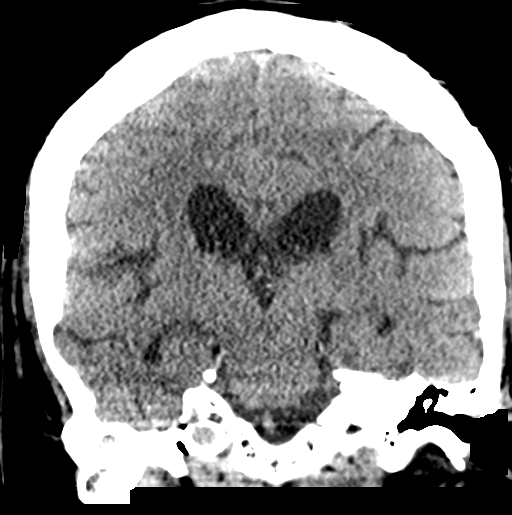
[im 37/67  brain]
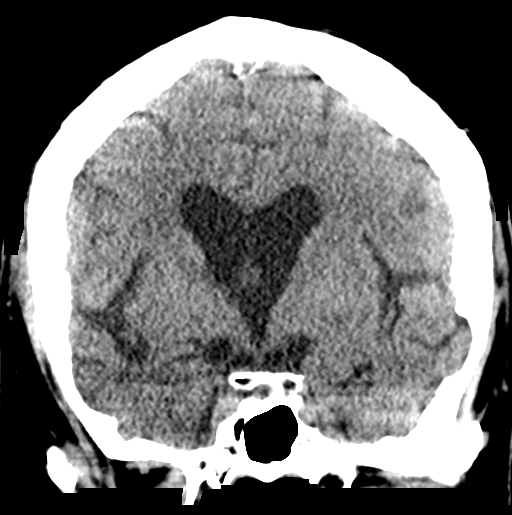

[Series 5: sagittal soft tissue · sagittal · 0.32mm/px · 3 of 52 slices shown]
[im 18/52  brain]
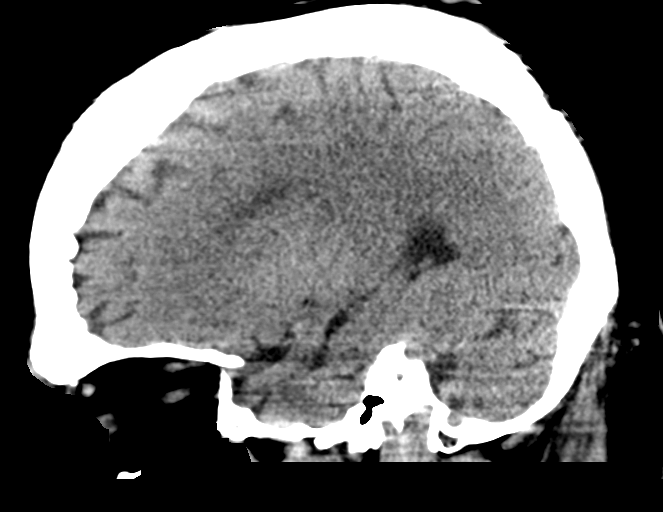
[im 26/52  brain]
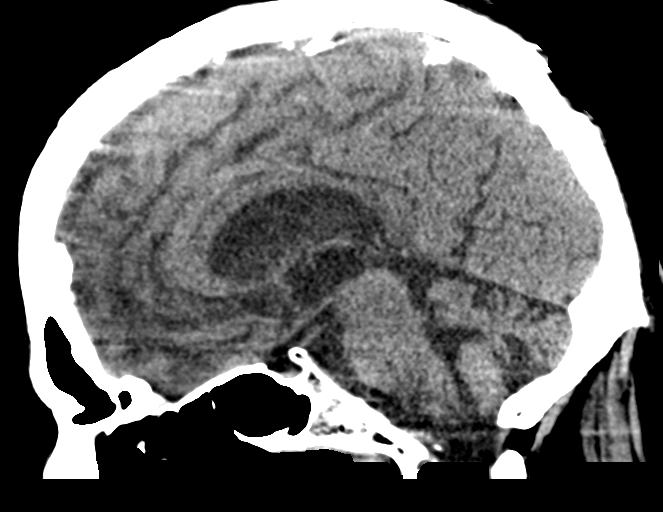
[im 35/52  brain]
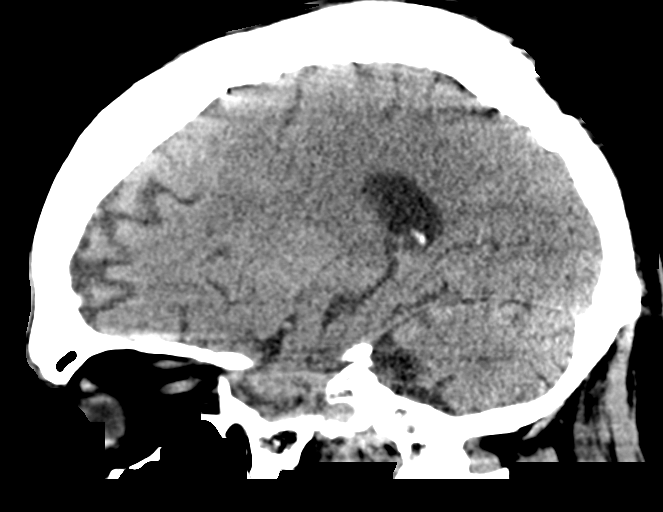

[16 of 47 positions shown; findings below may reference images not displayed]

FINDINGS: Brain: Unchanged degree of generalized atrophy. Mild chronic small
vessel ischemia. No intracranial hemorrhage, mass effect, or midline
shift. No hydrocephalus. The basilar cisterns are patent. No
evidence of territorial infarct or acute ischemia. No extra-axial or
intracranial fluid collection.

Vascular: No hyperdense vessel or unexpected calcification.

Skull: No fracture or focal lesion.

Sinuses/Orbits: Paranasal sinuses are clear. Minimal unchanged
opacification of lower left mastoid air cells. The visualized orbits
are unremarkable.

Other: None.
IMPRESSION: 1.  No acute intracranial abnormality.
2. Generalized atrophy, unchanged from prior exam, advanced for age.

## 2018-10-04 ENCOUNTER — Emergency Department (HOSPITAL_COMMUNITY): Payer: Medicare Other

## 2018-10-04 ENCOUNTER — Encounter (HOSPITAL_COMMUNITY): Payer: Self-pay | Admitting: Emergency Medicine

## 2018-10-04 ENCOUNTER — Emergency Department (HOSPITAL_COMMUNITY)
Admission: EM | Admit: 2018-10-04 | Discharge: 2018-10-04 | Disposition: A | Discharge status: Home / Self Care | Payer: Medicare Other | Attending: Emergency Medicine | Admitting: Emergency Medicine

## 2018-10-04 DIAGNOSIS — Y999 Unspecified external cause status: Secondary | ICD-10-CM | POA: Insufficient documentation

## 2018-10-04 DIAGNOSIS — S0990XA Unspecified injury of head, initial encounter: Secondary | ICD-10-CM | POA: Diagnosis present

## 2018-10-04 DIAGNOSIS — Y939 Activity, unspecified: Secondary | ICD-10-CM | POA: Insufficient documentation

## 2018-10-04 DIAGNOSIS — S0181XA Laceration without foreign body of other part of head, initial encounter: Secondary | ICD-10-CM

## 2018-10-04 DIAGNOSIS — E039 Hypothyroidism, unspecified: Secondary | ICD-10-CM | POA: Insufficient documentation

## 2018-10-04 DIAGNOSIS — I252 Old myocardial infarction: Secondary | ICD-10-CM | POA: Insufficient documentation

## 2018-10-04 DIAGNOSIS — Y92129 Unspecified place in nursing home as the place of occurrence of the external cause: Secondary | ICD-10-CM | POA: Diagnosis not present

## 2018-10-04 DIAGNOSIS — M25552 Pain in left hip: Secondary | ICD-10-CM | POA: Insufficient documentation

## 2018-10-04 DIAGNOSIS — R0789 Other chest pain: Secondary | ICD-10-CM | POA: Diagnosis not present

## 2018-10-04 DIAGNOSIS — F0390 Unspecified dementia without behavioral disturbance: Secondary | ICD-10-CM | POA: Diagnosis not present

## 2018-10-04 DIAGNOSIS — W19XXXA Unspecified fall, initial encounter: Secondary | ICD-10-CM | POA: Diagnosis not present

## 2018-10-04 DIAGNOSIS — S01111A Laceration without foreign body of right eyelid and periocular area, initial encounter: Secondary | ICD-10-CM | POA: Insufficient documentation

## 2018-10-04 DIAGNOSIS — Z79899 Other long term (current) drug therapy: Secondary | ICD-10-CM | POA: Diagnosis not present

## 2018-10-04 DIAGNOSIS — Z91040 Latex allergy status: Secondary | ICD-10-CM | POA: Insufficient documentation

## 2018-10-04 HISTORY — DX: Unspecified dementia, unspecified severity, without behavioral disturbance, psychotic disturbance, mood disturbance, and anxiety: F03.90

## 2018-10-04 NOTE — ED Notes (Signed)
[facility] paged to transport pt.

## 2018-10-04 NOTE — ED Provider Notes (Signed)
[facility] - Inpatient EMERGENCY DEPARTMENT Provider Note   CSN: [id number] Arrival date & time: 10/04/18  0053     History   Chief Complaint Chief Complaint  Patient presents with  . Fall    HPI [name] is a 52 y.o. male.  The history is provided by the EMS personnel and medical records. The history is limited by the condition of the patient (anoxic brain injury/dementia).  Fall  This is a new problem. The current episode started less than 1 hour ago. The problem occurs constantly. The problem has not changed since onset.Pertinent negatives include no chest pain, no abdominal pain and no shortness of breath. Nothing aggravates the symptoms. Nothing relieves the symptoms. He has tried nothing for the symptoms. The treatment provided no relief.  Small laceration medial to the right eyebrow.    Past Medical History:  Diagnosis Date  . Addison disease (HCC)   . Anoxic brain injury (HCC)   . Anxiety   . Aspiration pneumonia (HCC)   . Atrial fibrillation (HCC)   . Chronic constipation 11/23/2011  . Dementia (HCC)   . Dysphagia   . Encephalopathy   . Glaucoma   . Glucocorticoid deficiency (HCC)   . History of recurrent UTIs   . Hyperlipemia   . Hypothyroidism   . Mental disorder   . Myocardial infarction (HCC)   . Pneumoperitoneum of unknown etiology 12/21/2011  . Quadriplegia (HCC)   . Reflux   . Seizure disorder [facility])     Patient Active Problem List   Diagnosis Date Noted  . PEG Gastrostomy tube in place (HCC) 01/26/2018  . 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 01/26/2018 01/26/2018  . Bedridden 01/26/2018  . Quadriplegia (HCC)   . Abdominal wall abscess 01/25/2018  . Fracture of humerus anatomical neck, right, closed, initial encounter 10/03/2017  . AKI (acute kidney injury) (HCC) 10/03/2017  . UTI (urinary tract infection) 10/03/2017  . Agitation 07/03/2014  . Cardiomyopathy, ischemic 06/18/2014  . 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    . Acute psychosis (HCC) 04/19/2014  . Unspecified hypothyroidism 05/16/2013  . Constipation 05/16/2013  . Anemia of other chronic disease 05/16/2013  . Mental disorder 01/03/2013  . Addisons disease (HCC) 01/03/2013  . PEG (percutaneous endoscopic gastrostomy) adjustment/replacement/removal (HCC) 12/21/2011  . Hypokalemia 11/25/2011  . Thrombocytopenia (HCC) 11/25/2011  . FTT (failure to thrive) in adult 11/25/2011  . Grand mal seizure (HCC) 11/23/2011  . Depression (emotion) 11/23/2011  . History of atrial fibrillation 11/23/2011  . Other specified hemorrhagic conditions (HCC) 11/23/2011  . Glaucoma 11/23/2011  . Hypoglycemia 11/23/2011  . Chronic constipation 11/23/2011  . Anoxic brain injury (HCC) 11/12/2011  . Protein calorie malnutrition (HCC) 11/12/2011  . Hyperchloremia 11/12/2011  . Hyperthyroidism 11/12/2011  . Acute lower UTI 11/12/2011  . Dysphagia     Past Surgical History:  Procedure Laterality Date  . DEBRIDEMENT OF ABDOMINAL WALL ABSCESS  01/26/2018  . HERNIA MESH REMOVAL  01/26/2018   Infected mesh - removed w I&D abd wall abscess  . IR GENERIC HISTORICAL  10/20/2016   IR REPLC GASTRO/COLONIC TUBE PERCUT W/FLUORO 10/20/2016 Oley Balm, MD [facility]-INTERV RAD  . IR REPLACE G-TUBE SIMPLE WO FLUORO  01/28/2018  . IR REPLC GASTRO/COLONIC TUBE PERCUT W/FLUORO  04/21/2017  . IR REPLC GASTRO/COLONIC TUBE PERCUT W/FLUORO  07/22/2017  . IR REPLC GASTRO/COLONIC TUBE PERCUT W/FLUORO  11/24/2017  . IRRIGATION AND DEBRIDEMENT ABSCESS N/A 01/26/2018   Procedure: IRRIGATION AND DEBRIDEMENT ABDOMINAL WALL ABSCESS WITH REMOVAL OF INFECTED MESH;  Surgeon: Karie Soda, MD;  Location: [facility];  Service: General;  Laterality: N/A;  . NEPHRECTOMY  unknown  . PEG TUBE PLACEMENT          Home Medications    Prior to Admission medications   Medication Sig Start Date End Date Taking? Authorizing Provider  acetaminophen (TYLENOL) 325 MG tablet Place 650 mg into feeding tube every 4  (four) hours as needed for mild pain.    [provider]  Amino Acids-Protein Hydrolys (FEEDING SUPPLEMENT, PRO-STAT SUGAR FREE 64,) LIQD Take 30 mLs by mouth 2 (two) times daily.    [provider]  aspirin EC 81 MG tablet Take 1 tablet (81 mg total) by mouth daily. With food 01/30/18 01/30/19  Shon Hale, MD  divalproex (DEPAKOTE SPRINKLE) 125 MG capsule Take 500 mg by mouth 2 (two) times daily.  07/18/13   [provider]  levothyroxine (SYNTHROID, LEVOTHROID) 150 MCG tablet Take 150 mcg by mouth daily before breakfast.    [provider]  METOPROLOL SUCCINATE ER PO 12.5 mg by PEG Tube route 2 (two) times daily. Metoprolol tartrate 6.25 mg/5 ml suspension    [provider]  Multiple Vitamins-Minerals (CERTAVITE/ANTIOXIDANTS) LIQD 15 mLs by PEG Tube route daily at 12 noon.     [provider]  Nutritional Supplements (VITAL 1.5 CAL PO) Take 237 mLs by mouth as needed (For less than 50% food consumption.).    [provider]  pantoprazole sodium (PROTONIX) 40 mg/20 mL PACK 40 mg by PEG Tube route daily at 12 noon.  11/30/11   [name], MD  PARoxetine (PAXIL) 20 MG tablet Take 20 mg by mouth daily. 06/03/17   [provider]  polyethylene glycol (MIRALAX / GLYCOLAX) packet 17 g by PEG Tube route 2 (two) times daily. Mix 17gm  In 4-8 ounces of liquid    [provider]  polyvinyl alcohol (LIQUIFILM TEARS) 1.4 % ophthalmic solution Place 1 drop into both eyes 4 (four) times daily.     [provider]  predniSONE (DELTASONE) 10 MG tablet Take with Breakfast......20 mg daily for 4 days, then 10 mg daily for 4 days, then 5 mg daily after that Patient not taking: Reported on 07/04/2018 01/30/18   Shon Hale, MD  risperiDONE (RISPERDAL M-TABS) 2 MG disintegrating tablet Place 2 mg under the tongue 2 (two) times daily. 05/08/17   [provider]  sennosides (SENOKOT) 8.8 MG/5ML syrup Take 15 mLs by mouth  2 (two) times daily.    [provider]  traZODone (DESYREL) 50 MG tablet Take 50 mg by mouth 2 (two) times daily.  08/02/13   [provider]    Family History History reviewed. No pertinent family history.  Social History Social History   Tobacco Use  . Smoking status: Never Smoker  . Smokeless tobacco: Never Used  Substance Use Topics  . Alcohol use: No  . Drug use: No     Allergies   Ativan [lorazepam]; Latex; Morphine and related; Penicillins; Pyridium [phenazopyridine hcl]; and Soy allergy   Review of Systems Review of Systems  Unable to perform ROS: Dementia  Respiratory: Negative for shortness of breath.   Cardiovascular: Negative for chest pain.  Gastrointestinal: Negative for abdominal pain.  Skin: Positive for wound.     Physical Exam Updated Vital Signs BP 123/71 (BP Location: Right Arm)   Pulse 62   Temp 98.3 F (36.8 C) (Oral)   Resp 12   SpO2 99%   Physical  Exam  Constitutional: He appears well-developed and well-nourished. No distress.  HENT:  Head: Normocephalic.    Mouth/Throat: Oropharynx is clear and moist. No oropharyngeal exudate.  Eyes: Pupils are equal, round, and reactive to light. Conjunctivae are normal.  Neck: Normal range of motion. Neck supple.  Cardiovascular: Normal rate, regular rhythm, normal heart sounds and intact distal pulses.  Pulmonary/Chest: Effort normal and breath sounds normal. No stridor. He has no wheezes. He has no rales.  Abdominal: Soft. Bowel sounds are normal. He exhibits no mass. There is no tenderness. There is no rebound and no guarding.  Musculoskeletal: Normal range of motion.  Neurological: He is alert. He displays normal reflexes.  Skin: Skin is warm and dry. Capillary refill takes less than 2 seconds.     ED Treatments / Results  Labs (all labs ordered are listed, but only abnormal results are displayed) Results for orders placed or performed during the hospital encounter of  01/25/18  Culture, blood (routine x 2)  Result Value Ref Range   Specimen Description BLOOD LEFT HAND    Special Requests IN PEDIATRIC BOTTLE Blood Culture adequate volume    Culture  Setup Time      GRAM POSITIVE COCCI IN PEDIATRIC BOTTLE CRITICAL VALUE NOTED.  VALUE IS CONSISTENT WITH PREVIOUSLY REPORTED AND CALLED VALUE.    Culture (A)     STAPHYLOCOCCUS HOMINIS CORRECTED ON 02/03 AT 2956: PREVIOUSLY REPORTED AS STAPHYLOCOCCUS SPECIES (COAGULASE NEGATIVE) SUSCEPTIBILITIES PERFORMED ON PREVIOUS CULTURE WITHIN THE LAST 5 DAYS. Performed at [facility], [address] [place], [place] [address]    Report Status 01/30/2018 FINAL   Culture, blood (routine x 2)  Result Value Ref Range   Specimen Description BLOOD RIGHT HAND    Special Requests IN PEDIATRIC BOTTLE Blood Culture adequate volume    Culture  Setup Time      GRAM POSITIVE COCCI IN PEDIATRIC BOTTLE CRITICAL RESULT CALLED TO, READ BACK BY AND VERIFIED WITH: M. Lilliston Pharm.D. 17:10 01/26/18 ([email or web address]) Performed at [facility], [address] [place], [place] [address]    Culture STAPHYLOCOCCUS HOMINIS STAPHYLOCOCCUS CAPITIS  (A)    Report Status 01/31/2018 FINAL    Organism ID, Bacteria STAPHYLOCOCCUS HOMINIS    Organism ID, Bacteria STAPHYLOCOCCUS CAPITIS       Susceptibility   Staphylococcus capitis - MIC*    CIPROFLOXACIN <=0.5 SENSITIVE Sensitive     ERYTHROMYCIN <=0.25 SENSITIVE Sensitive     GENTAMICIN <=0.5 SENSITIVE Sensitive     OXACILLIN SENSITIVE Sensitive     TETRACYCLINE >=16 RESISTANT Resistant     VANCOMYCIN 1 SENSITIVE Sensitive     TRIMETH/SULFA <=10 SENSITIVE Sensitive     CLINDAMYCIN <=0.25 SENSITIVE Sensitive     RIFAMPIN <=0.5 SENSITIVE Sensitive     Inducible Clindamycin NEGATIVE Sensitive     * STAPHYLOCOCCUS CAPITIS   Staphylococcus hominis - MIC*    CIPROFLOXACIN <=0.5 SENSITIVE Sensitive     ERYTHROMYCIN 0.5 SENSITIVE Sensitive     GENTAMICIN <=0.5 SENSITIVE Sensitive       OXACILLIN <=0.25 SENSITIVE Sensitive     TETRACYCLINE >=16 RESISTANT Resistant     VANCOMYCIN 1 SENSITIVE Sensitive     TRIMETH/SULFA <=10 SENSITIVE Sensitive     CLINDAMYCIN <=0.25 SENSITIVE Sensitive     RIFAMPIN <=0.5 SENSITIVE Sensitive     Inducible Clindamycin NEGATIVE Sensitive     * STAPHYLOCOCCUS HOMINIS  Surgical PCR screen  Result Value Ref Range   MRSA, PCR NEGATIVE NEGATIVE   Staphylococcus aureus  NEGATIVE NEGATIVE  Blood Culture ID Panel (Reflexed)  Result Value Ref Range   Enterococcus species NOT DETECTED NOT DETECTED   Listeria monocytogenes NOT DETECTED NOT DETECTED   Staphylococcus species DETECTED (A) NOT DETECTED   Staphylococcus aureus (BCID) NOT DETECTED NOT DETECTED   Methicillin resistance DETECTED (A) NOT DETECTED   Streptococcus species NOT DETECTED NOT DETECTED   Streptococcus agalactiae NOT DETECTED NOT DETECTED   Streptococcus pneumoniae NOT DETECTED NOT DETECTED   Streptococcus pyogenes NOT DETECTED NOT DETECTED   Acinetobacter baumannii NOT DETECTED NOT DETECTED   Enterobacteriaceae species NOT DETECTED NOT DETECTED   Enterobacter cloacae complex NOT DETECTED NOT DETECTED   Escherichia coli NOT DETECTED NOT DETECTED   Klebsiella oxytoca NOT DETECTED NOT DETECTED   Klebsiella pneumoniae NOT DETECTED NOT DETECTED   Proteus species NOT DETECTED NOT DETECTED   Serratia marcescens NOT DETECTED NOT DETECTED   Haemophilus influenzae NOT DETECTED NOT DETECTED   Neisseria meningitidis NOT DETECTED NOT DETECTED   Pseudomonas aeruginosa NOT DETECTED NOT DETECTED   Candida albicans NOT DETECTED NOT DETECTED   Candida glabrata NOT DETECTED NOT DETECTED   Candida krusei NOT DETECTED NOT DETECTED   Candida parapsilosis NOT DETECTED NOT DETECTED   Candida tropicalis NOT DETECTED NOT DETECTED  Aerobic/Anaerobic Culture (surgical/deep wound)  Result Value Ref Range   Specimen Description      ABSCESS ABDOMEN Performed at [facility],  [address] [place], [place] [address]    Special Requests      NONE Performed at [facility], [address], [place], [place] [address]    Gram Stain      RARE WBC PRESENT, PREDOMINANTLY PMN MODERATE GRAM POSITIVE COCCI IN PAIRS IN CLUSTERS    Culture      ABUNDANT METHICILLIN RESISTANT STAPHYLOCOCCUS AUREUS NO ANAEROBES ISOLATED Performed at [facility], [address] [place], [place] [address]    Report Status 01/31/2018 FINAL    Organism ID, Bacteria METHICILLIN RESISTANT STAPHYLOCOCCUS AUREUS       Susceptibility   Methicillin resistant staphylococcus aureus - MIC*    CIPROFLOXACIN >=8 RESISTANT Resistant     ERYTHROMYCIN >=8 RESISTANT Resistant     GENTAMICIN <=0.5 SENSITIVE Sensitive     OXACILLIN >=4 RESISTANT Resistant     TETRACYCLINE <=1 SENSITIVE Sensitive     VANCOMYCIN <=0.5 SENSITIVE Sensitive     TRIMETH/SULFA <=10 SENSITIVE Sensitive     RIFAMPIN <=0.5 SENSITIVE Sensitive     Inducible Clindamycin NEGATIVE Sensitive     * ABUNDANT METHICILLIN RESISTANT STAPHYLOCOCCUS AUREUS  Culture, blood (Routine X 2) w Reflex to ID Panel  Result Value Ref Range   Specimen Description      BLOOD LEFT ANTECUBITAL Performed at [facility], [address], [place], [place] [address]    Special Requests      IN PEDIATRIC BOTTLE Blood Culture adequate volume Performed at [facility], [address], [place], [place] [address]    Culture      NO GROWTH 5 DAYS Performed at [facility], [address] [place], [place] [address]    Report Status 02/01/2018 FINAL   Culture, blood (Routine X 2) w Reflex to ID Panel  Result Value Ref Range   Specimen Description      BLOOD RIGHT HAND Performed at [facility], [address] [place], [place] [address]    Special Requests      IN PEDIATRIC BOTTLE Blood  Culture adequate volume Performed at [facility], [address] [place], [place] [address]    Culture      NO GROWTH 5 DAYS Performed at [facility], [address], [place], [place] [address]    Report Status 02/01/2018 FINAL   CBC with Differential  Result Value Ref Range   WBC 8.3 4.0 - 10.5 K/uL   RBC 4.66 4.22 - 5.81 MIL/uL   Hemoglobin 12.6 (L) 13.0 - 17.0 g/dL   HCT 09.8 11.9 - 14.7 %   MCV 83.7 78.0 - 100.0 fL   MCH 27.0 26.0 - 34.0 pg   MCHC 32.3 30.0 - 36.0 g/dL   RDW 82.9 56.2 - 13.0 %   Platelets 174 150 - 400 K/uL   Neutrophils Relative % 63 %   Neutro Abs 5.3 1.7 - 7.7 K/uL   Lymphocytes Relative 23 %   Lymphs Abs 1.9 0.7 - 4.0 K/uL   Monocytes Relative 12 %   Monocytes Absolute 1.0 0.1 - 1.0 K/uL   Eosinophils Relative 2 %   Eosinophils Absolute 0.1 0.0 - 0.7 K/uL   Basophils Relative 0 %   Basophils Absolute 0.0 0.0 - 0.1 K/uL  Comprehensive metabolic panel  Result Value Ref Range   Sodium 142 135 - 145 mmol/L   Potassium 4.6 3.5 - 5.1 mmol/L   Chloride 107 101 - 111 mmol/L   CO2 26 22 - 32 mmol/L   Glucose, Bld 92 65 - 99 mg/dL   BUN 15 6 - 20 mg/dL   Creatinine, Ser 8.65 0.61 - 1.24 mg/dL   Calcium 8.9 8.9 - 78.4 mg/dL   Total Protein 7.3 6.5 - 8.1 g/dL   Albumin 3.0 (L) 3.5 - 5.0 g/dL   AST 15 15 - 41 U/L   ALT 9 (L) 17 - 63 U/L   Alkaline Phosphatase 71 38 - 126 U/L   Total Bilirubin 0.1 (L) 0.3 - 1.2 mg/dL   GFR calc non Af Amer >60 >60 mL/min   GFR calc Af Amer >60 >60 mL/min   Anion gap 9 5 - 15  Cortisol-am, blood  Result Value Ref Range   Cortisol - AM 5.3 (L) 6.7 - 22.6 ug/dL  Magnesium  Result Value Ref Range   Magnesium 1.6 (L) 1.7 - 2.4 mg/dL  Phosphorus  Result Value Ref Range   Phosphorus 2.9 2.5 - 4.6 mg/dL  Prealbumin  Result Value Ref Range   Prealbumin 11.8 (L) 18 - 38 mg/dL  APTT  Result Value Ref Range   aPTT 36 24 - 36 seconds  Protime-INR  Result Value Ref Range   Prothrombin Time 14.5 11.4 - 15.2 seconds   INR 1.14   TSH  Result Value Ref Range   TSH  0.338 (L) 0.350 - 4.500 uIU/mL  Basic metabolic panel  Result Value Ref Range   Sodium 142 135 - 145 mmol/L   Potassium 3.7 3.5 - 5.1 mmol/L   Chloride 109 101 - 111 mmol/L   CO2 27 22 - 32 mmol/L   Glucose, Bld 80 65 - 99 mg/dL   BUN 13 6 - 20 mg/dL   Creatinine, Ser 6.96 0.61 - 1.24 mg/dL   Calcium 8.6 (L) 8.9 - 10.3 mg/dL   GFR calc non Af Amer >60 >60 mL/min   GFR calc Af Amer >60 >60 mL/min   Anion gap 6 5 - 15  CBC  Result Value Ref Range   WBC 8.6 4.0 -  10.5 K/uL   RBC 4.28 4.22 - 5.81 MIL/uL   Hemoglobin 11.3 (L) 13.0 - 17.0 g/dL   HCT 82.9 (L) 56.2 - 13.0 %   MCV 82.7 78.0 - 100.0 fL   MCH 26.4 26.0 - 34.0 pg   MCHC 31.9 30.0 - 36.0 g/dL   RDW 86.5 78.4 - 69.6 %   Platelets 162 150 - 400 K/uL  Glucose, capillary  Result Value Ref Range   Glucose-Capillary 135 (H) 65 - 99 mg/dL  Glucose, capillary  Result Value Ref Range   Glucose-Capillary 145 (H) 65 - 99 mg/dL  Glucose, capillary  Result Value Ref Range   Glucose-Capillary 167 (H) 65 - 99 mg/dL  Glucose, capillary  Result Value Ref Range   Glucose-Capillary 138 (H) 65 - 99 mg/dL  CBC  Result Value Ref Range   WBC 8.4 4.0 - 10.5 K/uL   RBC 4.27 4.22 - 5.81 MIL/uL   Hemoglobin 11.3 (L) 13.0 - 17.0 g/dL   HCT 29.5 (L) 28.4 - 13.2 %   MCV 81.7 78.0 - 100.0 fL   MCH 26.5 26.0 - 34.0 pg   MCHC 32.4 30.0 - 36.0 g/dL   RDW 44.0 10.2 - 72.5 %   Platelets 172 150 - 400 K/uL  Basic metabolic panel  Result Value Ref Range   Sodium 141 135 - 145 mmol/L   Potassium 3.3 (L) 3.5 - 5.1 mmol/L   Chloride 109 101 - 111 mmol/L   CO2 26 22 - 32 mmol/L   Glucose, Bld 109 (H) 65 - 99 mg/dL   BUN 16 6 - 20 mg/dL   Creatinine, Ser 3.66 0.61 - 1.24 mg/dL   Calcium 8.4 (L) 8.9 - 10.3 mg/dL   GFR calc non Af Amer >60 >60 mL/min   GFR calc Af Amer >60 >60 mL/min   Anion gap 6 5 - 15  Glucose, capillary  Result Value Ref Range   Glucose-Capillary 95 65 - 99 mg/dL  Glucose, capillary  Result Value Ref Range    Glucose-Capillary 90 65 - 99 mg/dL  Glucose, capillary  Result Value Ref Range   Glucose-Capillary 86 65 - 99 mg/dL  Glucose, capillary  Result Value Ref Range   Glucose-Capillary 85 65 - 99 mg/dL  CBC  Result Value Ref Range   WBC 6.6 4.0 - 10.5 K/uL   RBC 4.17 (L) 4.22 - 5.81 MIL/uL   Hemoglobin 11.1 (L) 13.0 - 17.0 g/dL   HCT 44.0 (L) 34.7 - 42.5 %   MCV 83.9 78.0 - 100.0 fL   MCH 26.6 26.0 - 34.0 pg   MCHC 31.7 30.0 - 36.0 g/dL   RDW 95.6 38.7 - 56.4 %   Platelets 189 150 - 400 K/uL  Basic metabolic panel  Result Value Ref Range   Sodium 143 135 - 145 mmol/L   Potassium 3.7 3.5 - 5.1 mmol/L   Chloride 109 101 - 111 mmol/L   CO2 29 22 - 32 mmol/L   Glucose, Bld 91 65 - 99 mg/dL   BUN 14 6 - 20 mg/dL   Creatinine, Ser 3.32 0.61 - 1.24 mg/dL   Calcium 8.5 (L) 8.9 - 10.3 mg/dL   GFR calc non Af Amer >60 >60 mL/min   GFR calc Af Amer >60 >60 mL/min   Anion gap 5 5 - 15  Glucose, capillary  Result Value Ref Range   Glucose-Capillary 93 65 - 99 mg/dL  Glucose, capillary  Result Value Ref Range   Glucose-Capillary 97  65 - 99 mg/dL  Glucose, capillary  Result Value Ref Range   Glucose-Capillary 117 (H) 65 - 99 mg/dL   Comment 1 Notify RN    Comment 2 Document in Chart   Vancomycin, peak  Result Value Ref Range   Vancomycin Pk 50 (H) 30 - 40 ug/mL  Glucose, capillary  Result Value Ref Range   Glucose-Capillary 116 (H) 65 - 99 mg/dL  Glucose, capillary  Result Value Ref Range   Glucose-Capillary 93 65 - 99 mg/dL  Vancomycin, peak  Result Value Ref Range   Vancomycin Pk 38 30 - 40 ug/mL  Glucose, capillary  Result Value Ref Range   Glucose-Capillary 99 65 - 99 mg/dL  Vancomycin, trough  Result Value Ref Range   Vancomycin Tr 22 (HH) 15 - 20 ug/mL  Creatinine, serum  Result Value Ref Range   Creatinine, Ser 0.65 0.61 - 1.24 mg/dL   GFR calc non Af Amer >60 >60 mL/min   GFR calc Af Amer >60 >60 mL/min  Glucose, capillary  Result Value Ref Range    Glucose-Capillary 118 (H) 65 - 99 mg/dL   Comment 1 Notify RN    Comment 2 Document in Chart   I-Stat CG4 Lactic Acid, ED  Result Value Ref Range   Lactic Acid, Venous 1.78 0.5 - 1.9 mmol/L  CBG monitoring, ED  Result Value Ref Range   Glucose-Capillary 66 65 - 99 mg/dL  CBG monitoring, ED  Result Value Ref Range   Glucose-Capillary 112 (H) 65 - 99 mg/dL   Dg Chest 2 View  Result Date: 10/04/2018 CLINICAL DATA:  Unwitnessed fall.  Right rib pain. EXAM: CHEST - 2 VIEW COMPARISON:  None. FINDINGS: Mostly nonunited right proximal humeral fracture, chronic. There may be a small partially united component. No pneumothorax or pulmonary contusion. No appreciable displaced right rib fracture, with the lower ribs obscured due to the low lung volumes. Thoracic spondylosis. Heart size within normal limits. IMPRESSION: 1. Chronic deformity of the right proximal humerus with partial if any union at the original fracture site. 2.  No active cardiopulmonary disease is radiographically apparent. 3. No displaced rib fracture is visualized. Electronically Signed   By: Gaylyn Rong M.D.   On: 10/04/2018 01:55   Ct Head Wo Contrast  Result Date: 10/04/2018 CLINICAL DATA:  Unwitnessed fall, right eyebrow laceration. Disorientation. Headache. EXAM: CT HEAD WITHOUT CONTRAST CT CERVICAL SPINE WITHOUT CONTRAST TECHNIQUE: Multidetector CT imaging of the head and cervical spine was performed following the standard protocol without intravenous contrast. Multiplanar CT image reconstructions of the cervical spine were also generated. COMPARISON:  Multiple exams, including 07/04/2018 and 10/02/2017 FINDINGS: CT HEAD FINDINGS Brain: The brainstem, cerebellum, cerebral peduncles, thalami, basal ganglia, basilar cisterns, and ventricular system appear within normal limits. No intracranial hemorrhage, mass lesion, or acute CVA. Vascular: Unremarkable Skull: Borderline thickening of the calvarium. Sinuses/Orbits: Chronic  bilateral maxillary and ethmoid sinusitis. Chronic mild irregularity of the right nasal bone common no change from 10/02/2017, compatible with an old injury. Other: Laceration along the right supraorbital region. CT CERVICAL SPINE FINDINGS Alignment: No vertebral subluxation is observed. Skull base and vertebrae: No cervical spine fracture or appreciable acute bony abnormality. Anterior spurring at the C1-2 articulation. Soft tissues and spinal canal: Unremarkable Disc levels: Uncinate and facet spurring cause osseous foraminal stenosis on the right at C4-5, C5-6, C6-7, C7-T1, and T1-2; and on the left at C3-4, C5-6, and C7-T1. Loss of intervertebral disc height at all levels between C3 and T1 noted.  Mildly congenitally short pedicles in the cervical spine. Upper chest: Unremarkable Other: No supplemental non-categorized findings. IMPRESSION: 1. No acute intracranial findings or acute cervical spine findings. 2. Multilevel foraminal impingement in the cervical spine due to spondylosis and degenerative disc disease. 3. Laceration along the right supraorbital region. 4. Mild chronic bilateral maxillary and ethmoid sinusitis. 5. Suspected old right nasal bone fracture common no change from last year. Electronically Signed   By: Gaylyn Rong M.D.   On: 10/04/2018 02:19   Ct Cervical Spine Wo Contrast  Result Date: 10/04/2018 CLINICAL DATA:  Unwitnessed fall, right eyebrow laceration. Disorientation. Headache. EXAM: CT HEAD WITHOUT CONTRAST CT CERVICAL SPINE WITHOUT CONTRAST TECHNIQUE: Multidetector CT imaging of the head and cervical spine was performed following the standard protocol without intravenous contrast. Multiplanar CT image reconstructions of the cervical spine were also generated. COMPARISON:  Multiple exams, including 07/04/2018 and 10/02/2017 FINDINGS: CT HEAD FINDINGS Brain: The brainstem, cerebellum, cerebral peduncles, thalami, basal ganglia, basilar cisterns, and ventricular system appear  within normal limits. No intracranial hemorrhage, mass lesion, or acute CVA. Vascular: Unremarkable Skull: Borderline thickening of the calvarium. Sinuses/Orbits: Chronic bilateral maxillary and ethmoid sinusitis. Chronic mild irregularity of the right nasal bone common no change from 10/02/2017, compatible with an old injury. Other: Laceration along the right supraorbital region. CT CERVICAL SPINE FINDINGS Alignment: No vertebral subluxation is observed. Skull base and vertebrae: No cervical spine fracture or appreciable acute bony abnormality. Anterior spurring at the C1-2 articulation. Soft tissues and spinal canal: Unremarkable Disc levels: Uncinate and facet spurring cause osseous foraminal stenosis on the right at C4-5, C5-6, C6-7, C7-T1, and T1-2; and on the left at C3-4, C5-6, and C7-T1. Loss of intervertebral disc height at all levels between C3 and T1 noted. Mildly congenitally short pedicles in the cervical spine. Upper chest: Unremarkable Other: No supplemental non-categorized findings. IMPRESSION: 1. No acute intracranial findings or acute cervical spine findings. 2. Multilevel foraminal impingement in the cervical spine due to spondylosis and degenerative disc disease. 3. Laceration along the right supraorbital region. 4. Mild chronic bilateral maxillary and ethmoid sinusitis. 5. Suspected old right nasal bone fracture common no change from last year. Electronically Signed   By: Gaylyn Rong M.D.   On: 10/04/2018 02:19   Dg Hip Unilat W Or Wo Pelvis 2-3 Views Left  Result Date: 10/04/2018 CLINICAL DATA:  Unwitnessed fall, left hip pain. EXAM: DG HIP (WITH OR WITHOUT PELVIS) 2-3V LEFT COMPARISON:  01/28/2018 FINDINGS: There is no evidence of hip fracture or dislocation. There is no evidence of arthropathy or other focal bone abnormality. IMPRESSION: Negative. Electronically Signed   By: Gaylyn Rong M.D.   On: 10/04/2018 01:57    Radiology No results  found.  Procedures .Marland KitchenLaceration Repair Date/Time: 10/04/2018 2:30 AM Performed by: Cy Blamer, MD Authorized by: Cy Blamer, MD   Consent:    Consent obtained:  Verbal   Risks discussed:  Infection, need for additional repair, nerve damage, poor wound healing, poor cosmetic result, pain, retained foreign body, tendon damage and vascular damage   Alternatives discussed:  No treatment Anesthesia (see MAR for exact dosages):    Anesthesia method:  None Laceration details:    Location:  Face   Face location:  Forehead   Length (cm):  1   Depth (mm):  1 Repair type:    Repair type:  Simple Pre-procedure details:    Preparation:  Patient was prepped and draped in usual sterile fashion Exploration:    Hemostasis achieved with:  Direct pressure   Wound exploration: wound explored through full range of motion   Treatment:    Area cleansed with:  Betadine Skin repair:    Repair method:  Tissue adhesive Approximation:    Approximation:  Close Post-procedure details:    Dressing:  Sterile dressing   Patient tolerance of procedure:  Tolerated well, no immediate complications   (including critical care time)     Final Clinical Impressions(s) / ED Diagnoses   Return for weakness, numbness, changes in vision or speech, fevers >100.4 unrelieved by medication, shortness of breath, intractable vomiting, or diarrhea, abdominal pain, Inability to tolerate liquids or food, cough, altered mental status or any concerns. No signs of systemic illness or infection. The patient is nontoxic-appearing on exam and vital signs are within normal limits.    I have reviewed the triage vital signs and the nursing notes. Pertinent labs &imaging results that were available during my care of the patient were reviewed by me and considered in my medical decision making (see chart for details).  After history, exam, and medical workup I feel the patient has been appropriately medically screened  and is safe for discharge home. Pertinent diagnoses were discussed with the patient. Patient was given return precautions.      Shacara Cozine, MD 10/04/18 0230

## 2018-10-04 NOTE — ED Triage Notes (Addendum)
Patient arrived with EMS from [facility] nursing unwitnessed fall found by staff on the floor with right eyebrow laceration approx.1/2" with mild bleeding , alert but disoriented at arrrival , pt. stated headache and points to right ribcage pain . Respirations unlabored.

## 2018-11-06 IMAGING — DX DG CHEST 2V
3 series · 3 of 3 positions shown · non-contrast
Comparison: None.

CLINICAL DATA: Unwitnessed fall.  Right rib pain.

EXAM:
CHEST - 2 VIEW

[chest lat]
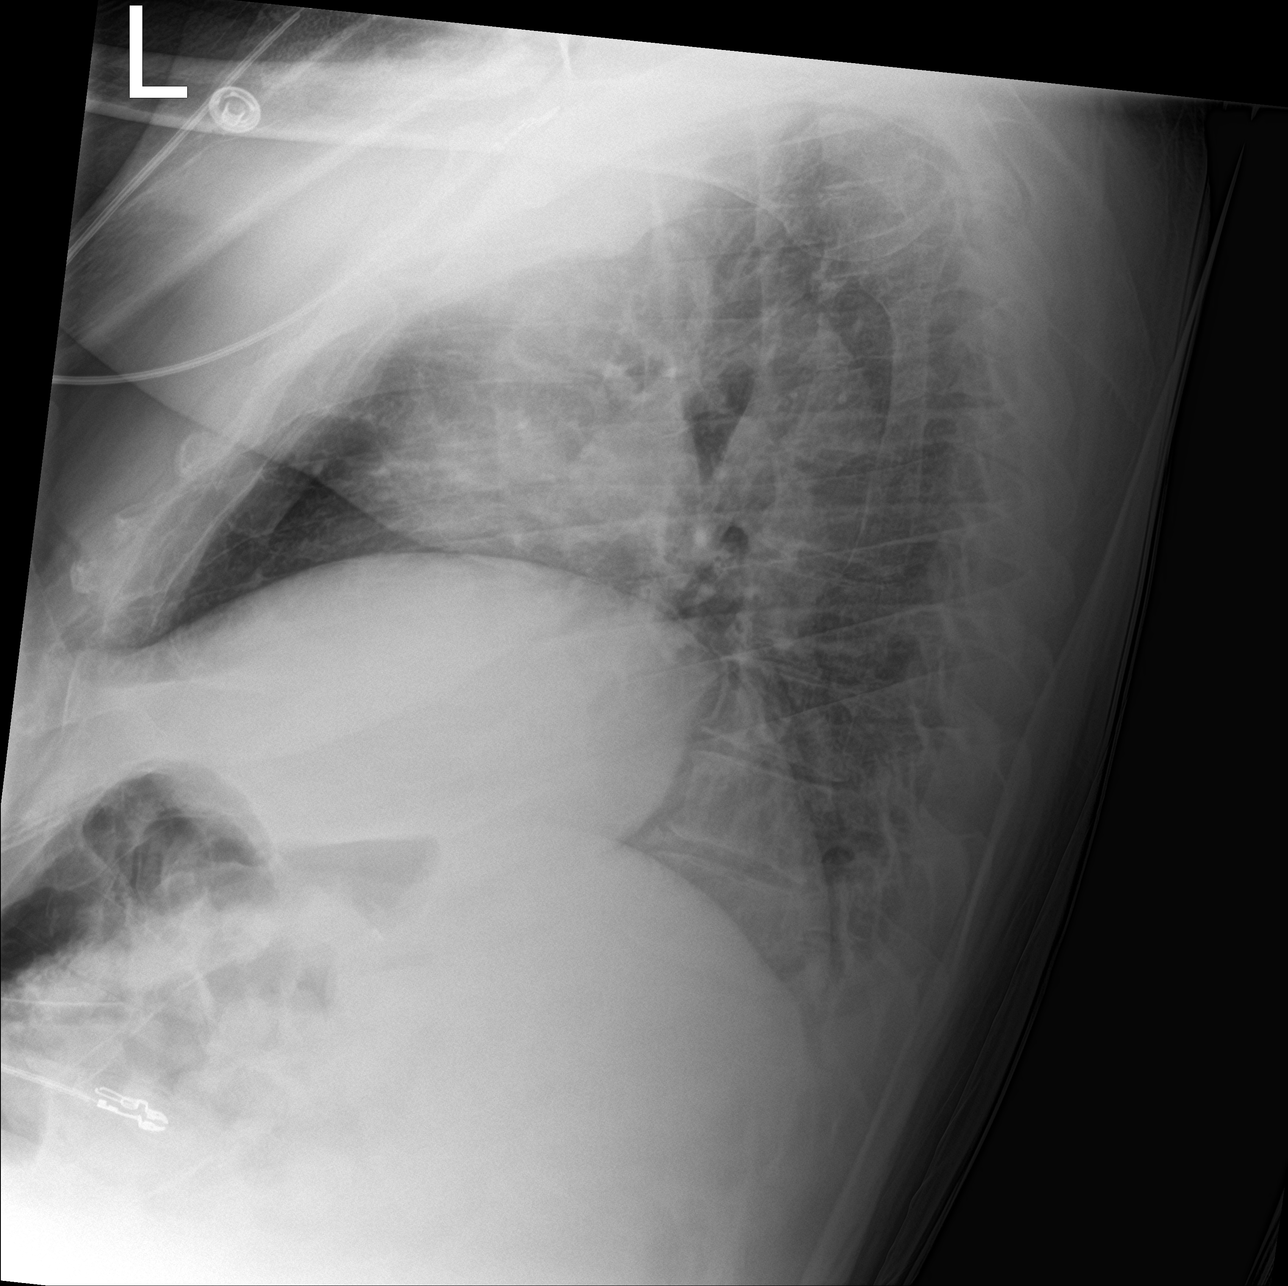

[chest ap (1 of 2)]
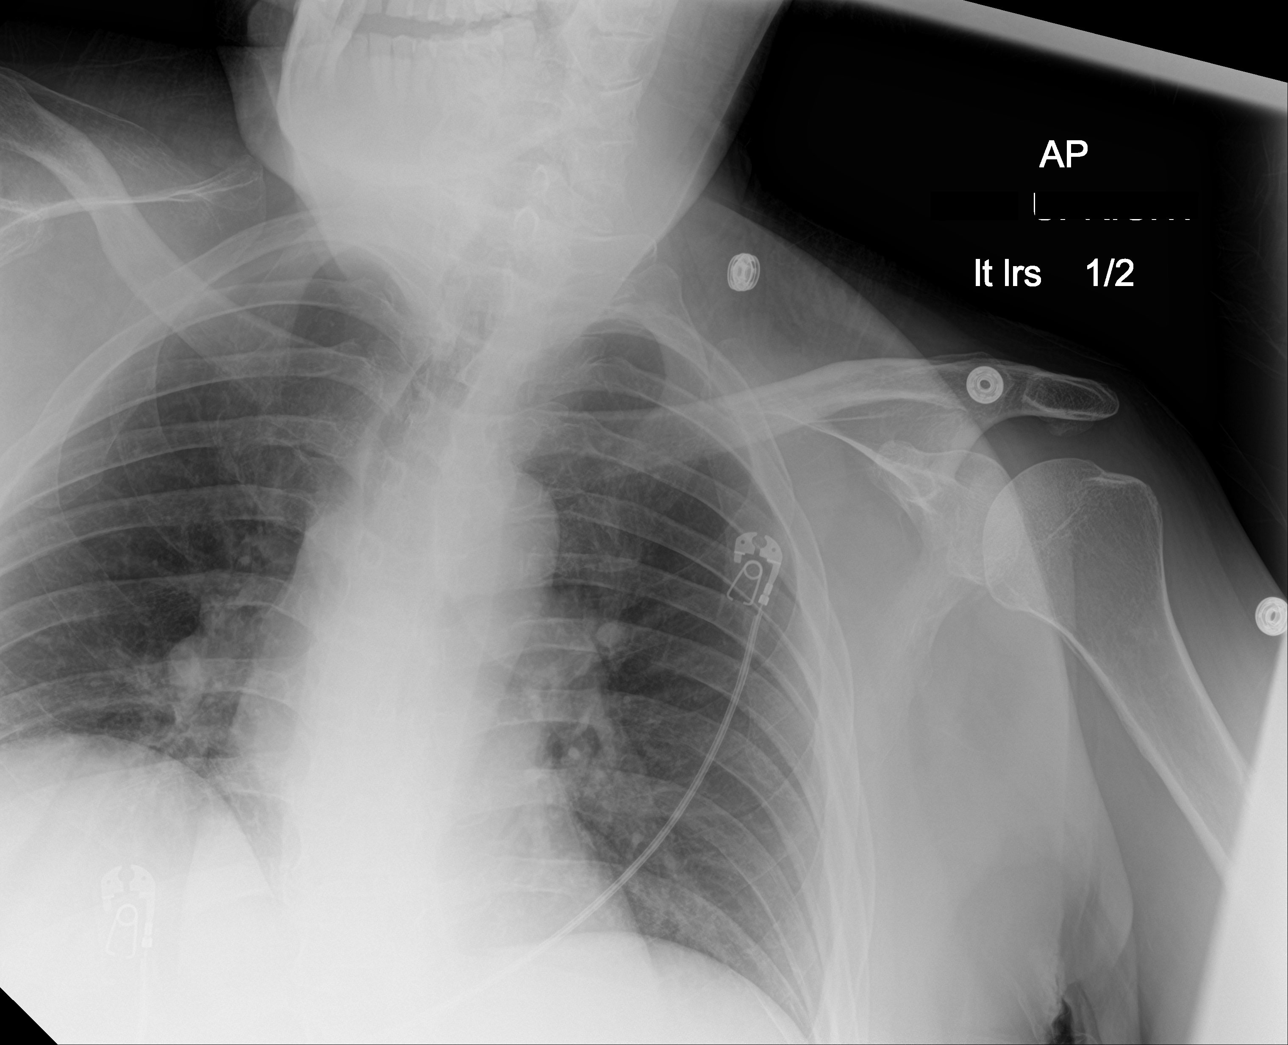

[chest ap (2 of 2)]
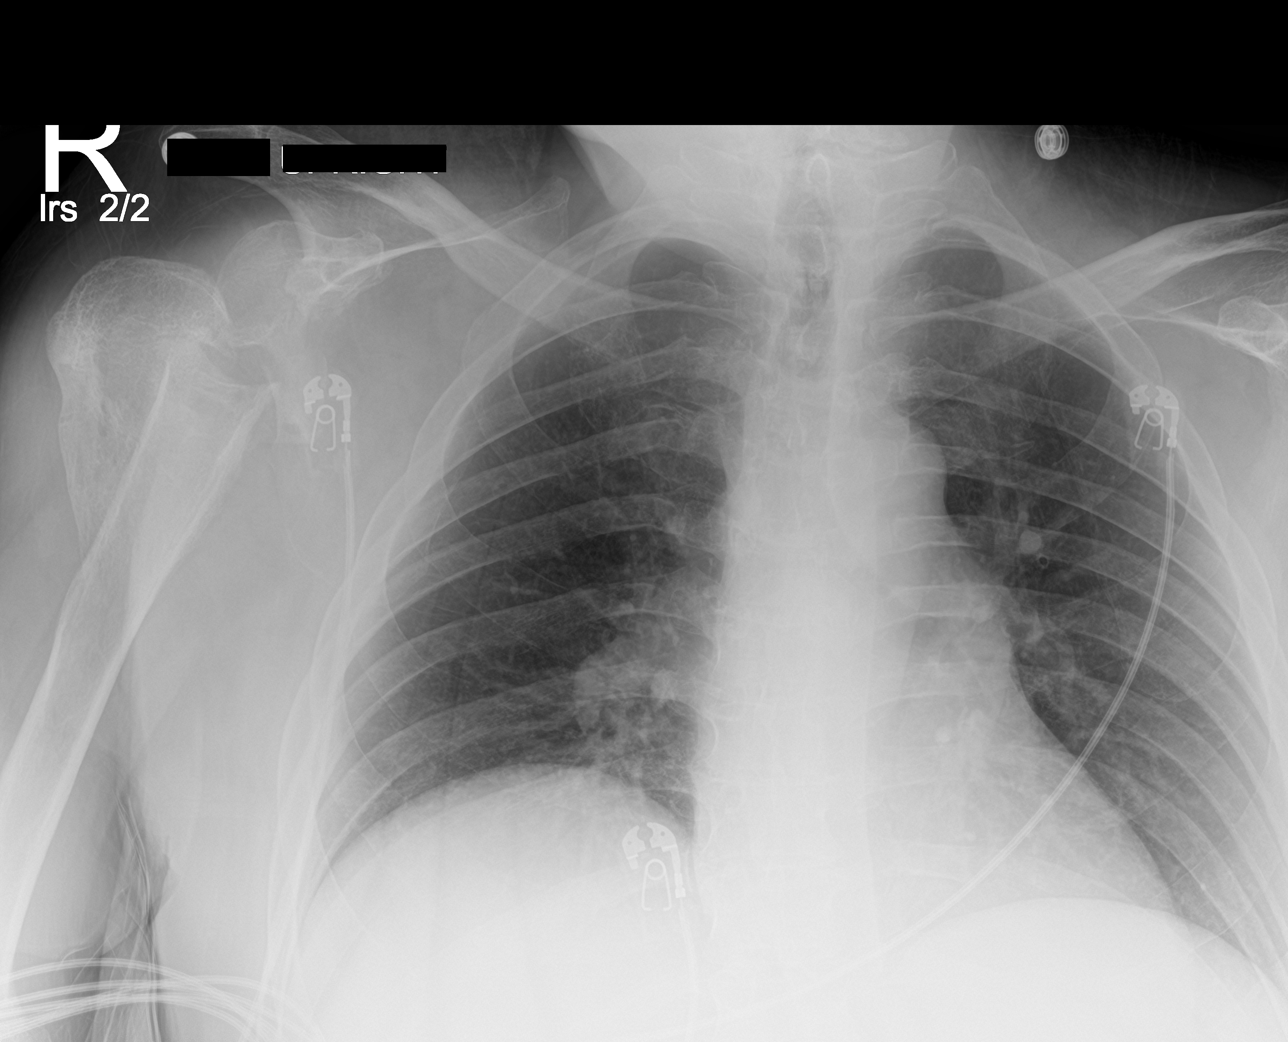

[3 of 3 positions shown; findings below may reference images not displayed]

FINDINGS: Mostly nonunited right proximal humeral fracture, chronic. There may
be a small partially united component.

No pneumothorax or pulmonary contusion. No appreciable displaced
right rib fracture, with the lower ribs obscured due to the low lung
volumes. Thoracic spondylosis. Heart size within normal limits.
IMPRESSION: 1. Chronic deformity of the right proximal humerus with partial if
any union at the original fracture site.
2.  No active cardiopulmonary disease is radiographically apparent.

3. No displaced rib fracture is visualized.

## 2018-11-06 IMAGING — CT CT CERVICAL SPINE W/O CM
5 of 8 series · 13 of 33 positions shown, 14 images · non-contrast
Comparison: Multiple exams, including 07/04/2018 and 10/02/2017

CLINICAL DATA: Unwitnessed fall, right eyebrow laceration.
Disorientation. Headache.

EXAM:
CT HEAD WITHOUT CONTRAST
CT CERVICAL SPINE WITHOUT CONTRAST
TECHNIQUE: Multidetector CT imaging of the head and cervical spine was
performed following the standard protocol without intravenous
contrast. Multiplanar CT image reconstructions of the cervical spine
were also generated.

[Series 5: head bone · axial · 0.44mm/px · z∈[-74,-16]mm · 2 of 87 slices shown]
[im 29/87  bone]
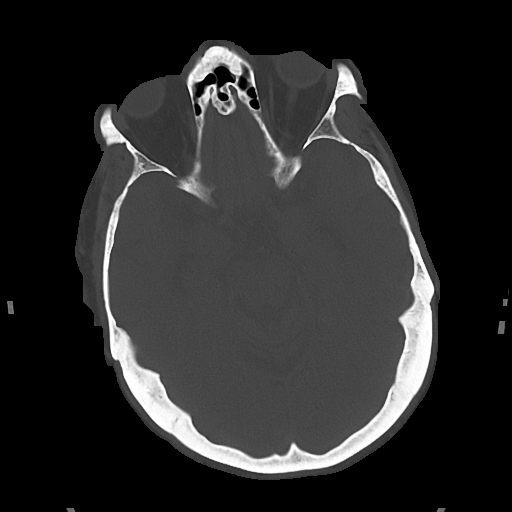
[im 58/87  bone]
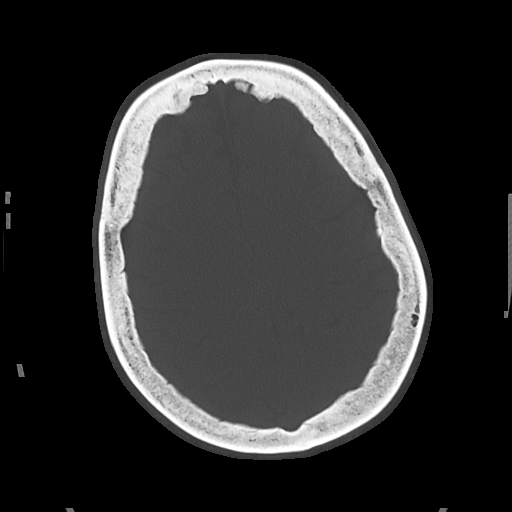

[Series 6: cor soft · coronal · 0.34mm/px · 2 of 72 slices shown]
[im 24/72  bone]
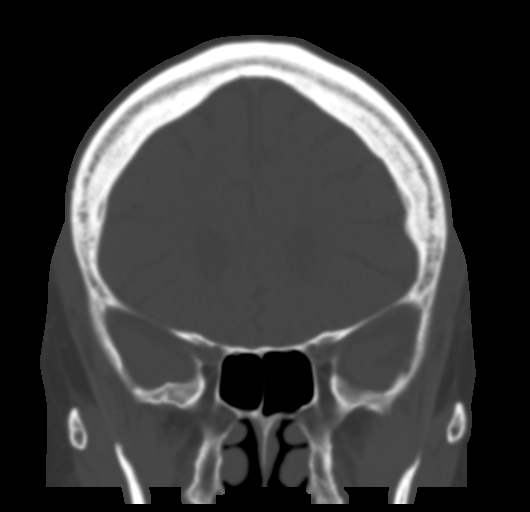
[im 48/72  bone]
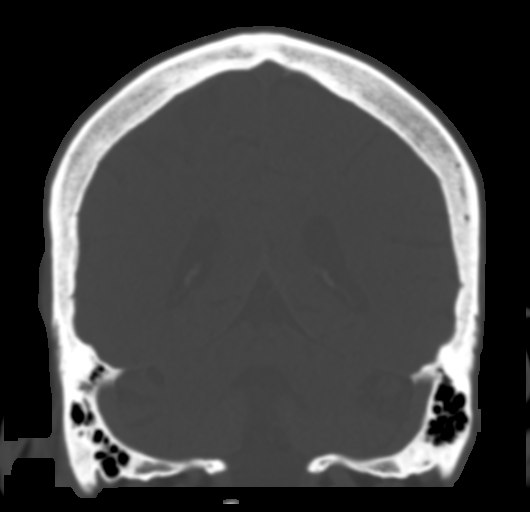

[Series 9: c spine soft · axial · 0.23mm/px · z∈[-215,-149]mm · 2 of 101 slices shown]
[im 34/101  soft-tissue]
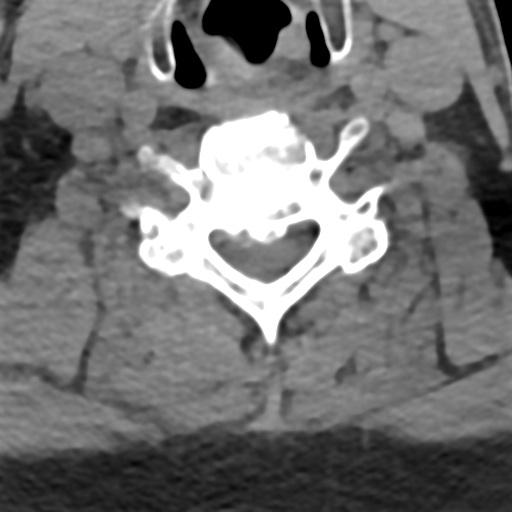
[im 67/101  soft-tissue]
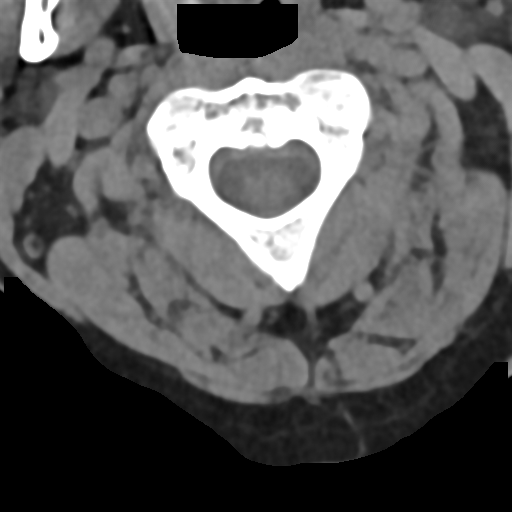

[Series 10: sag bone · sagittal · 0.25mm/px · 5 of 61 slices shown]
[im 11/61  bone]
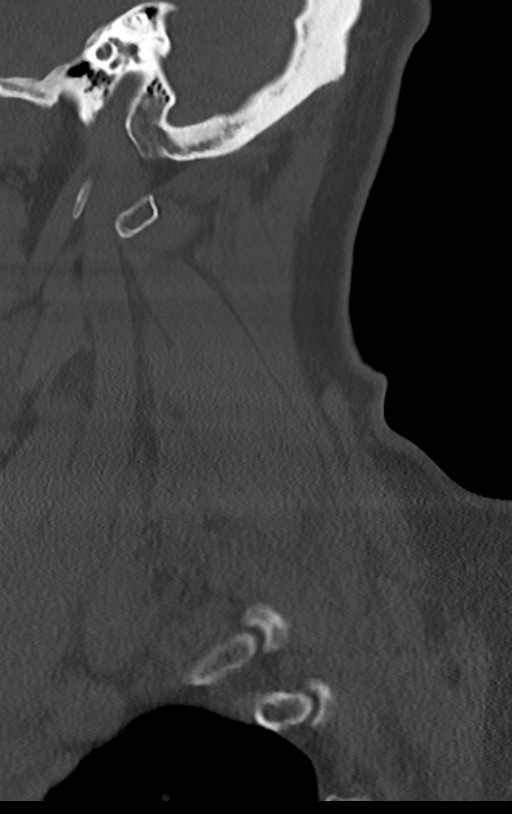
[im 21/61  bone]
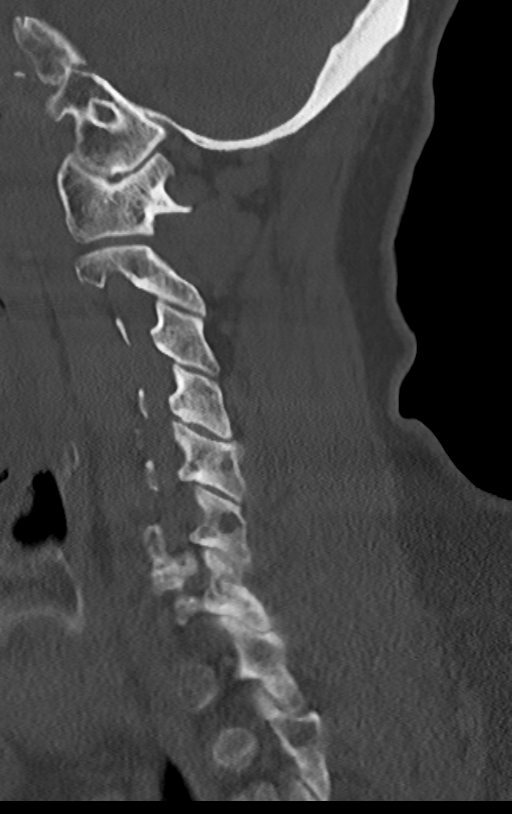
[im 31/61  bone]
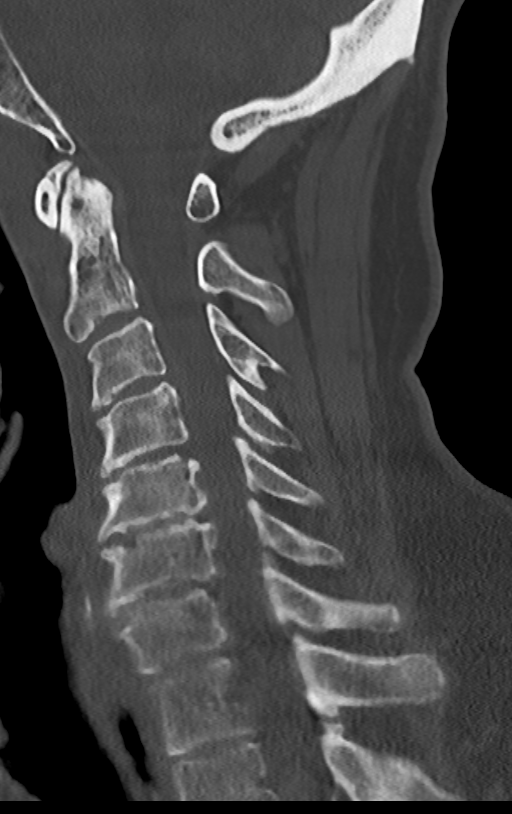
[im 41/61  bone]
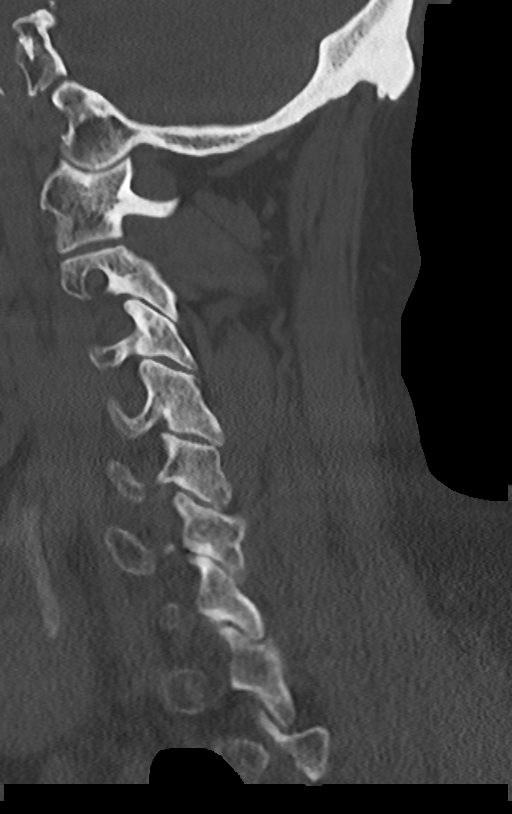
[im 51/61  bone]
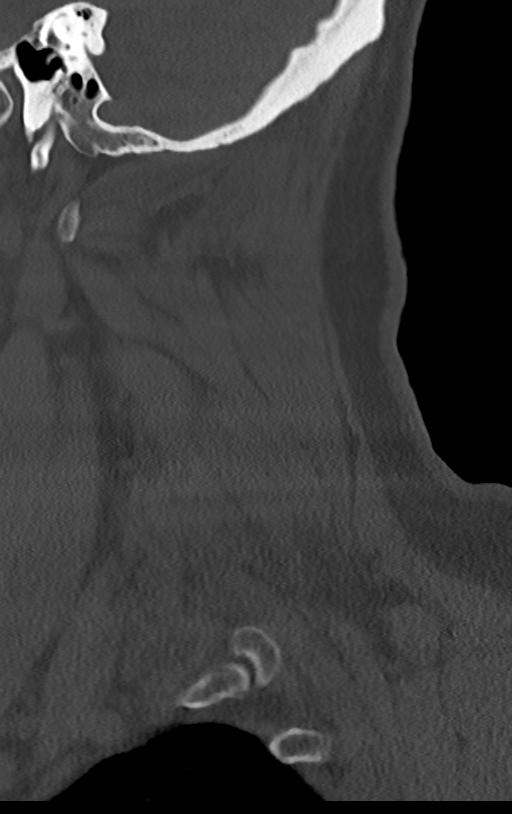

[Series 12: orthogonal axials · axial · 0.21mm/px · z∈[-240,-185]mm · 2 of 93 slices shown, 3 images]
[im 31/93  soft-tissue]
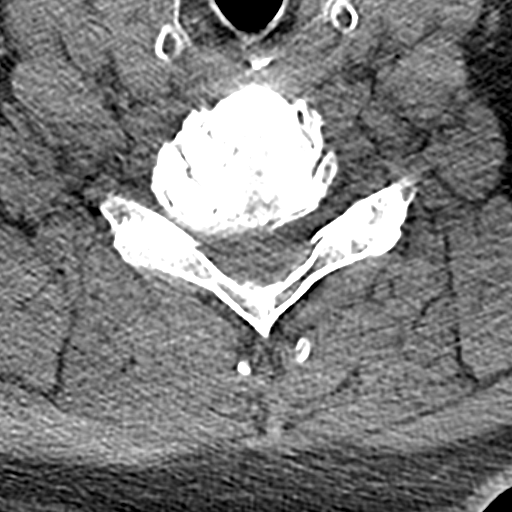
[im 31/93  bone]
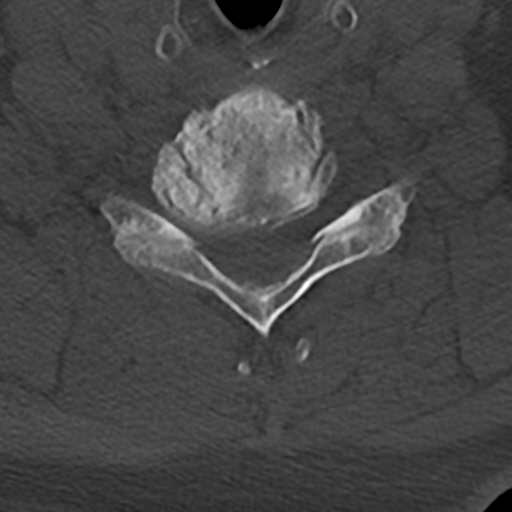
[im 62/93  bone]
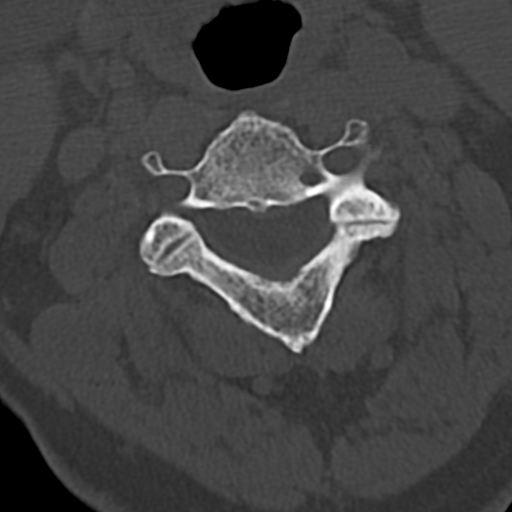

[13 of 33 positions shown; findings below may reference images not displayed]

FINDINGS: CT HEAD FINDINGS

Brain: The brainstem, cerebellum, cerebral peduncles, thalami, basal
ganglia, basilar cisterns, and ventricular system appear within
normal limits. No intracranial hemorrhage, mass lesion, or acute
CVA.

Vascular: Unremarkable

Skull: Borderline thickening of the calvarium.

Sinuses/Orbits: Chronic bilateral maxillary and ethmoid sinusitis.
Chronic mild irregularity of the right nasal bone common no change
from 10/02/2017, compatible with an old injury.

Other: Laceration along the right supraorbital region.

CT CERVICAL SPINE FINDINGS

Alignment: No vertebral subluxation is observed.

Skull base and vertebrae: No cervical spine fracture or appreciable
acute bony abnormality. Anterior spurring at the C1-2 articulation.

Soft tissues and spinal canal: Unremarkable

Disc levels: Uncinate and facet spurring cause osseous foraminal
stenosis on the right at C4-5, C5-6, C6-7, C7-T1, and T1-2; and on
the left at C3-4, C5-6, and C7-T1. Loss of intervertebral disc
height at all levels between C3 and T1 noted. Mildly congenitally
short pedicles in the cervical spine.

Upper chest: Unremarkable

Other: No supplemental non-categorized findings.
IMPRESSION: 1. No acute intracranial findings or acute cervical spine findings.
2. Multilevel foraminal impingement in the cervical spine due to
spondylosis and degenerative disc disease.
3. Laceration along the right supraorbital region.
4. Mild chronic bilateral maxillary and ethmoid sinusitis.
5. Suspected old right nasal bone fracture common no change from
last year.

## 2018-11-06 IMAGING — DX DG HIP (WITH OR WITHOUT PELVIS) 2-3V*L*
3 series · 3 of 3 positions shown · non-contrast
Comparison: 01/28/2018

CLINICAL DATA: Unwitnessed fall, left hip pain.

EXAM:
DG HIP (WITH OR WITHOUT PELVIS) 2-3V LEFT

[pelvis ap]
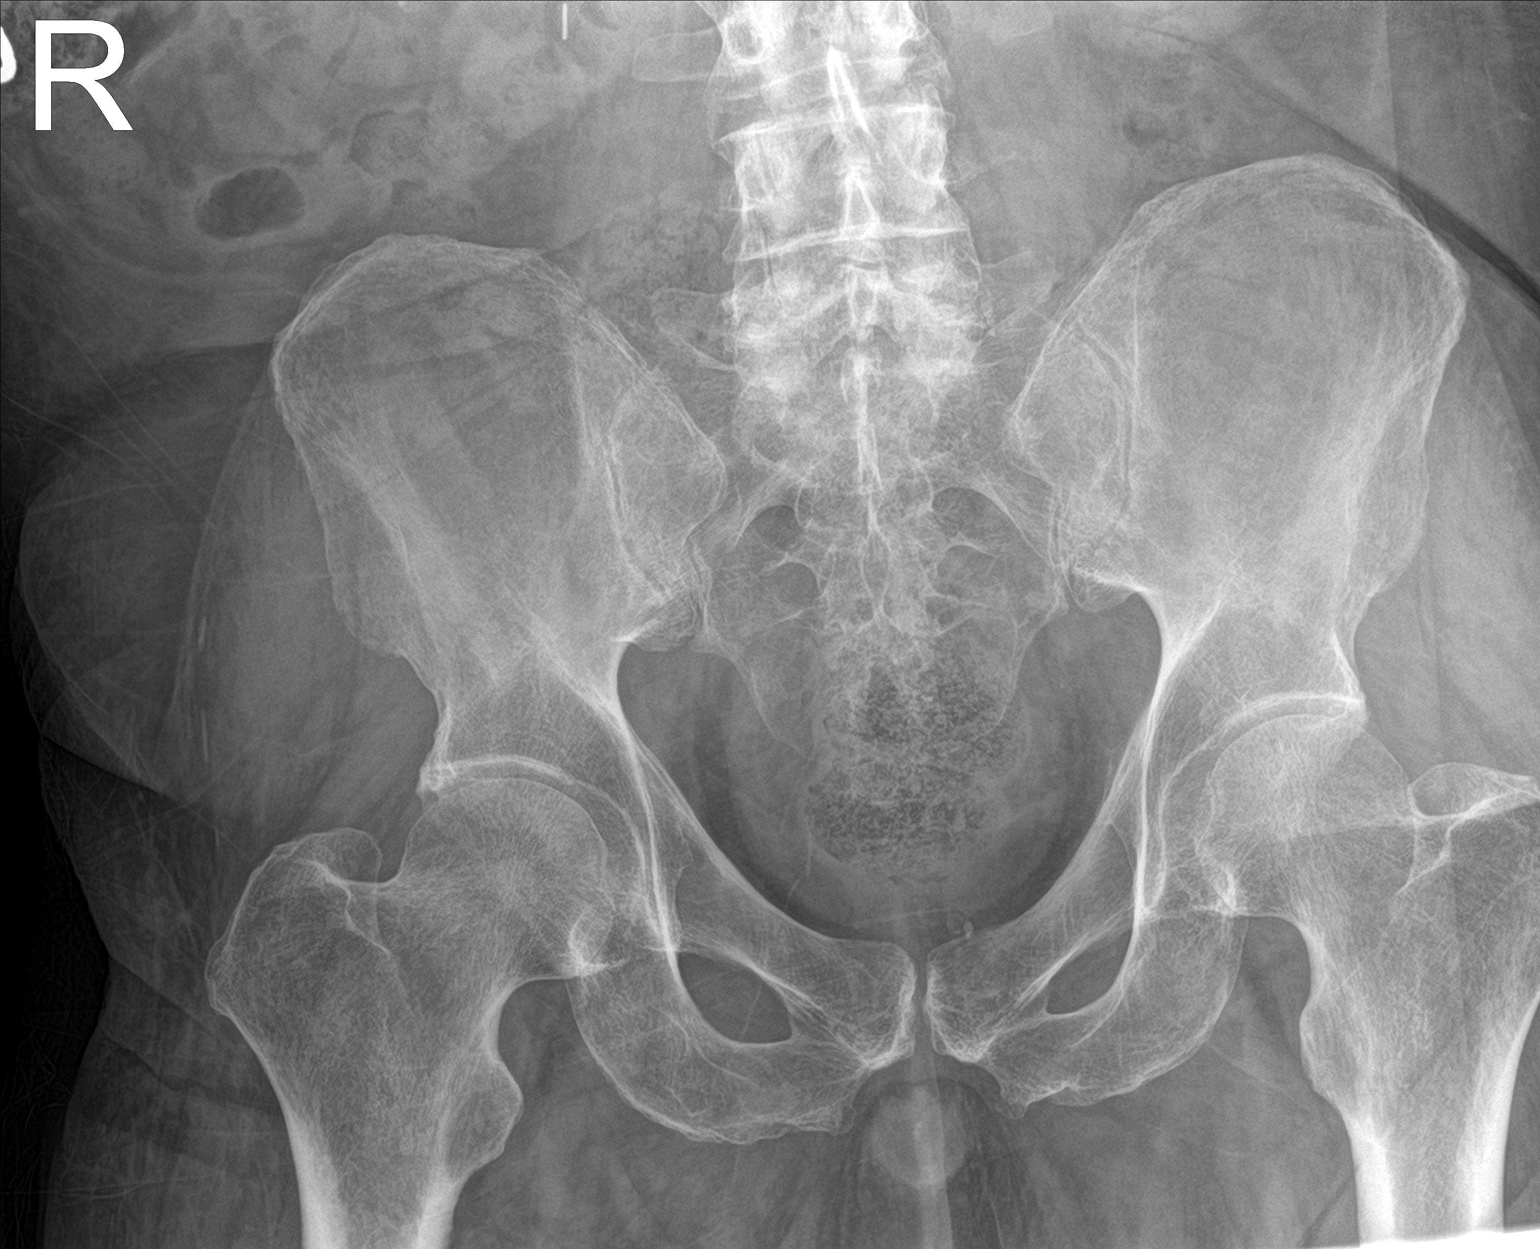

[hip ap]
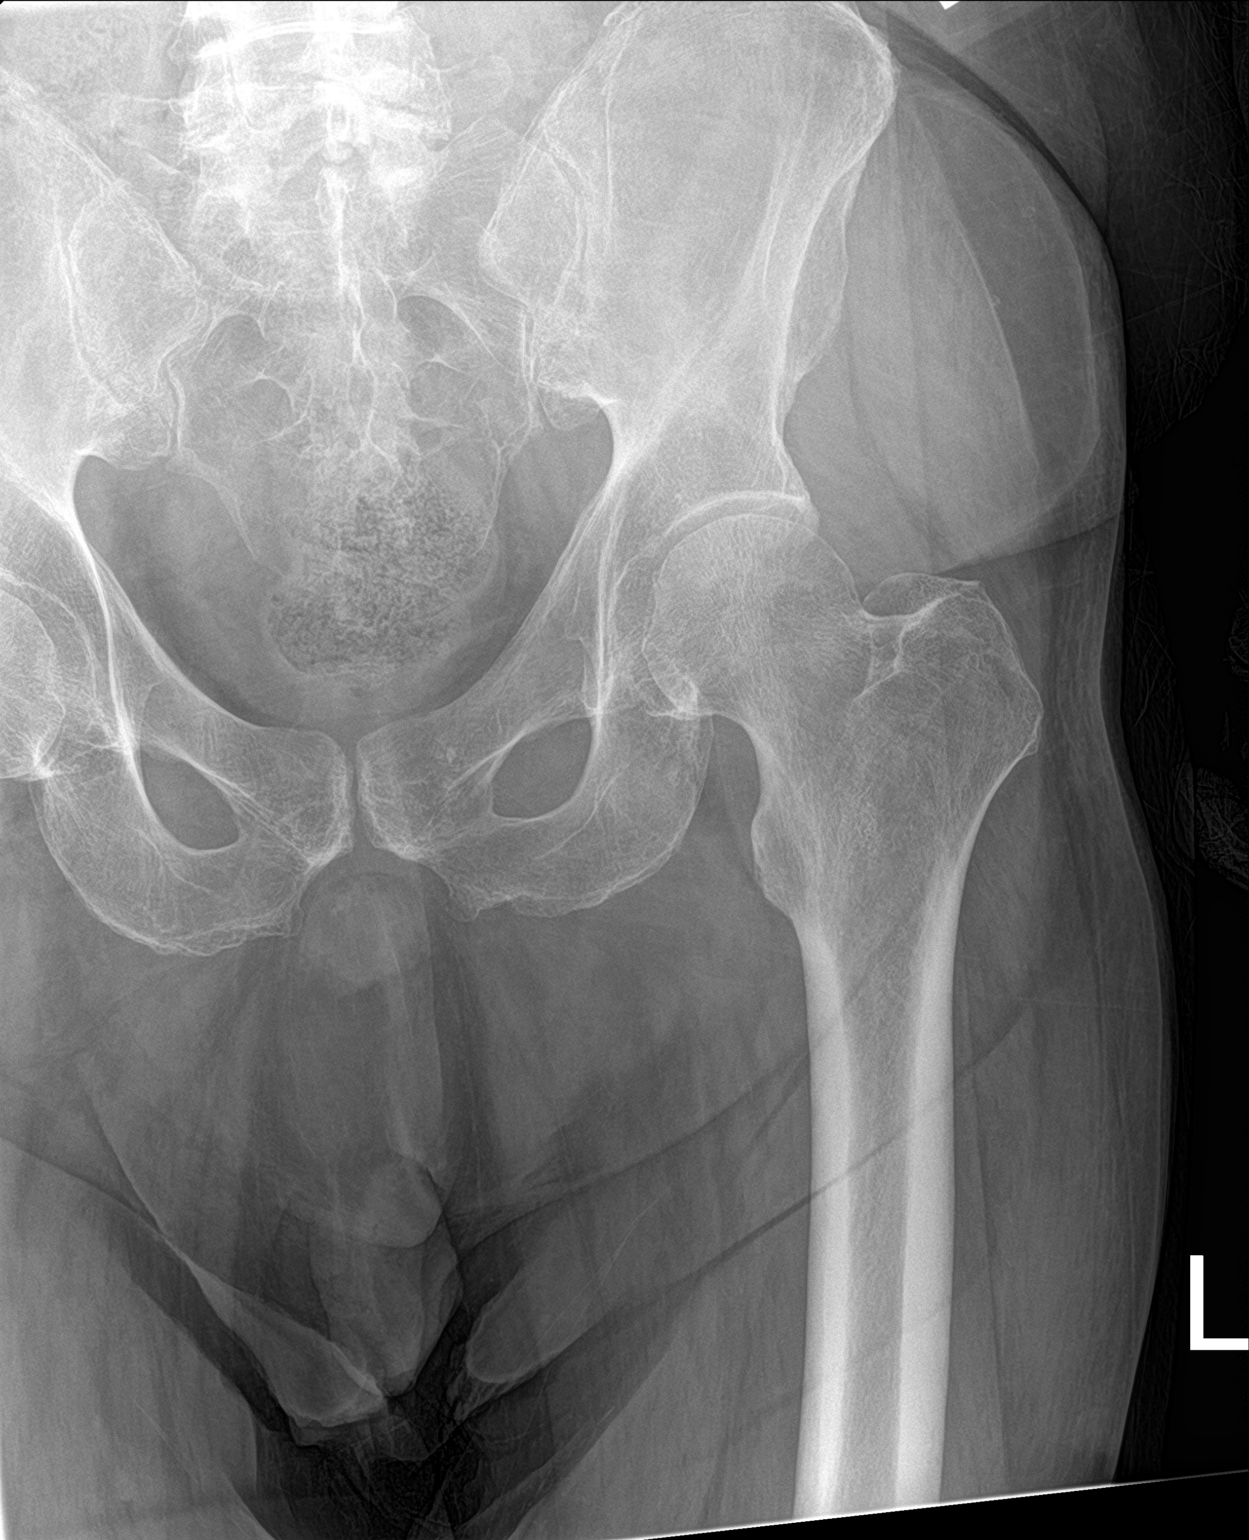

[hip lat]
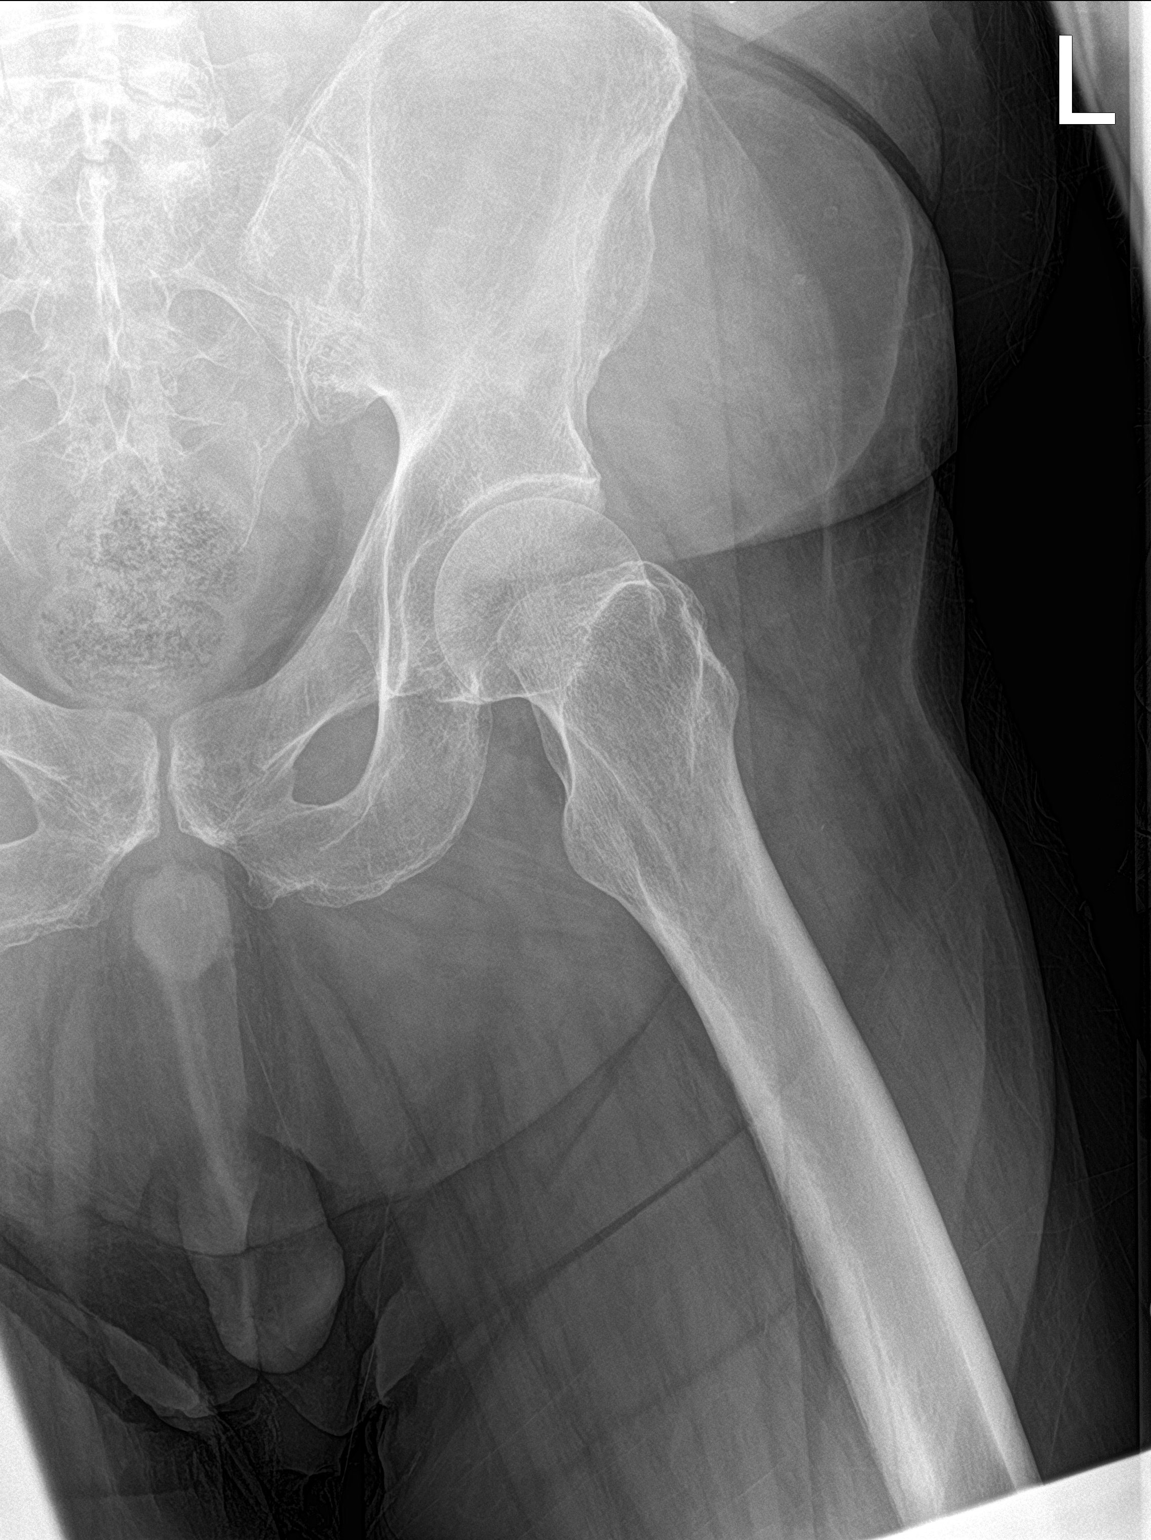

[3 of 3 positions shown; findings below may reference images not displayed]

FINDINGS: There is no evidence of hip fracture or dislocation. There is no
evidence of arthropathy or other focal bone abnormality.
IMPRESSION: Negative.

## 2018-12-20 ENCOUNTER — Encounter (HOSPITAL_COMMUNITY): Payer: Self-pay

## 2018-12-20 ENCOUNTER — Emergency Department (HOSPITAL_COMMUNITY): Payer: Medicare Other

## 2018-12-20 ENCOUNTER — Other Ambulatory Visit: Payer: Self-pay

## 2018-12-20 ENCOUNTER — Emergency Department (HOSPITAL_COMMUNITY)
Admission: EM | Admit: 2018-12-20 | Discharge: 2018-12-20 | Disposition: A | Discharge status: Home / Self Care | Payer: Medicare Other | Attending: Emergency Medicine | Admitting: Emergency Medicine

## 2018-12-20 DIAGNOSIS — F0390 Unspecified dementia without behavioral disturbance: Secondary | ICD-10-CM | POA: Diagnosis not present

## 2018-12-20 DIAGNOSIS — Z79899 Other long term (current) drug therapy: Secondary | ICD-10-CM | POA: Insufficient documentation

## 2018-12-20 DIAGNOSIS — R509 Fever, unspecified: Secondary | ICD-10-CM | POA: Insufficient documentation

## 2018-12-20 DIAGNOSIS — E039 Hypothyroidism, unspecified: Secondary | ICD-10-CM | POA: Diagnosis not present

## 2018-12-20 DIAGNOSIS — Z431 Encounter for attention to gastrostomy: Secondary | ICD-10-CM | POA: Diagnosis not present

## 2018-12-20 DIAGNOSIS — Z91040 Latex allergy status: Secondary | ICD-10-CM | POA: Insufficient documentation

## 2018-12-20 MED ORDER — IOPAMIDOL (ISOVUE-300) INJECTION 61%
INTRAVENOUS | Status: AC
  Filled 2018-12-20: qty 50

## 2018-12-20 NOTE — ED Triage Notes (Addendum)
Pt arrives via [facility] from [facility], per staff pt's feeding tube has become partially dislodged. Upon arrival to ED, feeding tube is completely displaced with skin irritation around site. Additional wound present to center of stomach. Pt has hx of anoxic brain injury.

## 2018-12-20 NOTE — ED Provider Notes (Signed)
[facility] EMERGENCY DEPARTMENT Provider Note   CSN: [id number] Arrival date & time: 12/20/18  1132     History   Chief Complaint Chief Complaint  Patient presents with  . feeding tube problem    HPI [name] is a 52 y.o. male with history of anoxic brain injury mostly nonverbal at baseline, Addison's disease, A. fib, dementia, encephalopathy, MI, HLD, quadriplegia, seizure disorder presents for evaluation sent from rehab center for dislodged PEG tube.  Per triage note, they were informed by the staff at [id number] that the patient's feeding tube had become partially dislodged.  Upon arrival to the ED, they noted that the feeding tube was completely displaced with evidence of skin irritation around the site.  The patient tells me that he pulled out the PEG tube 2 weeks ago.  He states that he has been feeling nauseated and intermittently febrile.  He also notes loose stools.  He denies chest pain or shortness of breath.  History is limited due to patient's baseline mental status.  A progress note accompanying the patient dated 10/27/2018 noted that the PEG tube was in place with a clean dressing.  There was a wound near the old PEG tube site with small track which appeared almost completely healed with no drainage.  The history is provided by the patient and medical records.    Past Medical History:  Diagnosis Date  . Addison disease (HCC)   . Anoxic brain injury (HCC)   . Anxiety   . Aspiration pneumonia (HCC)   . Atrial fibrillation (HCC)   . Chronic constipation 11/23/2011  . Dementia (HCC)   . Dysphagia   . Encephalopathy   . Glaucoma   . Glucocorticoid deficiency (HCC)   . History of recurrent UTIs   . Hyperlipemia   . Hypothyroidism   . Mental disorder   . Myocardial infarction (HCC)   . Pneumoperitoneum of unknown etiology 12/21/2011  . Quadriplegia (HCC)   . Reflux   . Seizure disorder [facility])     Patient Active Problem List   Diagnosis Date Noted  . PEG Gastrostomy tube in place (HCC) 01/26/2018  . 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 01/26/2018 01/26/2018  . Bedridden 01/26/2018  . Quadriplegia (HCC)   . Abdominal wall abscess 01/25/2018  . Fracture of humerus anatomical neck, right, closed, initial encounter 10/03/2017  . AKI (acute kidney injury) (HCC) 10/03/2017  . UTI (urinary tract infection) 10/03/2017  . Agitation 07/03/2014  . Cardiomyopathy, ischemic 06/18/2014  . 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 Acute psychosis (HCC) 04/19/2014  . Unspecified hypothyroidism 05/16/2013  . Constipation 05/16/2013  . Anemia of other chronic disease 05/16/2013  . Mental disorder 01/03/2013  . Addisons disease (HCC) 01/03/2013  . PEG (percutaneous endoscopic gastrostomy) adjustment/replacement/removal (HCC) 12/21/2011  . Hypokalemia 11/25/2011  . Thrombocytopenia (HCC) 11/25/2011  . FTT (failure to thrive) in adult 11/25/2011  . Grand mal seizure (HCC) 11/23/2011  . Depression (emotion) 11/23/2011  . History of atrial fibrillation 11/23/2011  . Other specified hemorrhagic conditions (HCC) 11/23/2011  . Glaucoma 11/23/2011  . Hypoglycemia 11/23/2011  . Chronic constipation 11/23/2011  . Anoxic brain injury (HCC) 11/12/2011  . Protein calorie malnutrition (HCC) 11/12/2011  . Hyperchloremia 11/12/2011  . Hyperthyroidism 11/12/2011  . Acute lower UTI 11/12/2011  . Dysphagia     Past Surgical History:  Procedure Laterality Date  . DEBRIDEMENT OF ABDOMINAL WALL ABSCESS  01/26/2018  . HERNIA MESH REMOVAL  01/26/2018   Infected mesh -  removed w I&D abd wall abscess  . IR GENERIC HISTORICAL  10/20/2016   IR REPLC GASTRO/COLONIC TUBE PERCUT W/FLUORO 10/20/2016 Oley Balmaniel Hassell, MD [facility]-INTER[facility] RAD  . IR REPLACE G-TUBE SIMPLE WO FLUORO  01/28/2018  . IR REPLC GASTRO/COLONIC TUBE PERCUT W/FLUORO  04/21/2017  . IR REPLC GASTRO/COLONIC TUBE PERCUT W/FLUORO  07/22/2017  . IR REPLC GASTRO/COLONIC TUBE PERCUT  W/FLUORO  11/24/2017  . IRRIGATION AND DEBRIDEMENT ABSCESS N/A 01/26/2018   Procedure: IRRIGATION AND DEBRIDEMENT ABDOMINAL WALL ABSCESS WITH REMOVAL OF INFECTED MESH;  Surgeon: Karie SodaGross, Steven, MD;  Location: [facility];  Service: General;  Laterality: N/A;  . NEPHRECTOMY  unknown  . PEG TUBE PLACEMENT          Home Medications    Prior to Admission medications   Medication Sig Start Date End Date Taking? Authorizing Provider  acetaminophen (TYLENOL) 325 MG tablet Place 650 mg into feeding tube every 4 (four) hours as needed for mild pain.   Yes [provider]  Amino Acids-Protein Hydrolys (FEEDING SUPPLEMENT, PRO-STAT SUGAR FREE 64,) LIQD Place 30 mLs into feeding tube 2 (two) times daily.    Yes [provider]  aspirin EC 81 MG tablet Take 1 tablet (81 mg total) by mouth daily. With food Patient taking differently: Take 81 mg by mouth daily.  01/30/18 01/30/19 Yes Emokpae, Courage, MD  Carboxymeth-Glycerin-Polysorb (REFRESH OPTIVE ADVANCED OP) Place 1 drop into both eyes 3 (three) times daily.   Yes [provider]  divalproex (DEPAKOTE SPRINKLE) 125 MG capsule Take 375-500 mg by mouth See admin instructions. Take 3 capsules (375mg ) via g-tube every morning, then take 4 capsules (500mg ) via g-tube every evening. 07/18/13  Yes [provider]  levothyroxine (SYNTHROID, LEVOTHROID) 150 MCG tablet Place 150 mcg into feeding tube daily before breakfast.    Yes [provider]  METOPROLOL SUCCINATE ER PO 12.5 mg by PEG Tube route 2 (two) times daily. Metoprolol tartrate 6.25 mg/5 ml suspension   Yes [provider]  Multiple Vitamins-Minerals (CERTAVITE/ANTIOXIDANTS) LIQD 15 mLs by PEG Tube route daily at 12 noon.    Yes [provider]  Nutritional Supplements (VITAL 1.5 CAL PO) Take 237 mLs by mouth as needed (For less than 50% food consumption.).   Yes [provider]  pantoprazole sodium (PROTONIX) 40 mg/20 mL PACK 40 mg by PEG  Tube route daily at 12 noon.  11/30/11  Yes Alinda Moneyavis, Novlet J, MD  PARoxetine (PAXIL) 20 MG tablet Place 20 mg into feeding tube daily.  06/03/17  Yes [provider]  polyethylene glycol (MIRALAX / GLYCOLAX) packet 17 g by PEG Tube route 2 (two) times daily. Mix 17gm  In 4-8 ounces of liquid   Yes [provider]  risperiDONE (RISPERDAL M-TABS) 2 MG disintegrating tablet Place 2 mg under the tongue 2 (two) times daily. 05/08/17  Yes [provider]  sennosides (SENOKOT) 8.8 MG/5ML syrup Place 15 mLs into feeding tube 2 (two) times daily.    Yes [provider]  traZODone (DESYREL) 50 MG tablet Take 25-50 mg by mouth See admin instructions. 25mg  in am, 50mg  at bedtime. 08/02/13  Yes [provider]    Family History No family history on file.  Social History Social History   Tobacco Use  . Smoking status: Never Smoker  . Smokeless tobacco: Never Used  Substance Use Topics  . Alcohol use: No  . Drug use: No     Allergies   Ativan [lorazepam]; Latex; Morphine and related; Penicillins;  Pyridium [phenazopyridine hcl]; and Soy allergy   Review of Systems Review of Systems  Constitutional: Positive for fever.  Respiratory: Negative for shortness of breath.   Cardiovascular: Negative for chest pain.  Gastrointestinal: Positive for diarrhea and nausea.  Skin: Positive for color change and wound.     Physical Exam Updated Vital Signs BP 103/65   Pulse 61   Temp 97.9 F (36.6 C) (Oral)   Resp 18   SpO2 96%   Physical Exam Vitals signs and nursing note reviewed.  Constitutional:      General: He is not in acute distress.    Appearance: He is well-developed.  HENT:     Head: Normocephalic and atraumatic.  Eyes:     General:        Right eye: No discharge.        Left eye: No discharge.     Conjunctiva/sclera: Conjunctivae normal.  Neck:     Vascular: No JVD.     Trachea: No tracheal deviation.  Cardiovascular:     Rate and Rhythm:  Normal rate.     Pulses: Normal pulses.  Pulmonary:     Effort: Pulmonary effort is normal.     Breath sounds: Normal breath sounds.  Abdominal:     General: A surgical scar is present. Bowel sounds are decreased. There is no distension.     Palpations: Abdomen is soft.     Comments: See below images.  PEG site with PEG tube dislodged.  There is surrounding erythema but no significant drainage.  He also has another wound inferior to the umbilicus with what appears to be iodoform gauze.  There is some purulent drainage from the site.  Skin:    Findings: No erythema.  Neurological:     Mental Status: He is alert. Mental status is at baseline.     Comments: Dysarthric speech.  Quadriplegia at baseline.  Psychiatric:        Behavior: Behavior normal.      ED Treatments / Results  Labs (all labs ordered are listed, but only abnormal results are displayed) Labs Reviewed - No data to display  EKG None  Radiology Dg Abdomen Peg Tube Location  Result Date: 12/20/2018 CLINICAL DATA:  PEG tube placement. EXAM: ABDOMEN - 1 VIEW COMPARISON:  02/20/2018. FINDINGS: PEG 2 tip in the stomach with injected contrast in the stomach. No extravasated contrast is seen. Right mid abdominal surgical clips are noted. Dextroconvex thoracolumbar scoliosis, partially positional. IMPRESSION: PEG tube tip in the stomach. Electronically Signed   By: Beckie Salts M.D.   On: 12/20/2018 15:36    Procedures FEEDING TUBE REPLACEMENT Date/Time: 12/20/2018 3:44 PM Performed by: Jeanie Sewer, PA-C Authorized by: Jeanie Sewer, PA-C  Consent given by: power of attorney Patient understanding: patient states understanding of the procedure being performed Patient consent: the patient's understanding of the procedure matches consent given Site marked: the operative site was marked Imaging studies: imaging studies available Required items: required blood products, implants, devices, and special equipment  available Patient identity confirmed: verbally with patient and arm band Time out: Immediately prior to procedure a "time out" was called to verify the correct patient, procedure, equipment, support staff and site/side marked as required. Preparation: Patient was prepped and draped in the usual sterile fashion. Indications: tube dislodged Local anesthesia used: no  Anesthesia: Local anesthesia used: no  Sedation: Patient sedated: no  Tube type: gastrostomy Patient position: supine Procedure type: replacement Tube size: 24 Fr Endoscope used: no  Bulb inflation volume: 6 (ml) Bulb inflation fluid: sterile water Placement/position confirmation: x-ray Tube placement difficulty: minimal Patient tolerance: Patient tolerated the procedure well with no immediate complications    (including critical care time)  Medications Ordered in ED Medications  iopamidol (ISOVUE-300) 61 % injection (has no administration in time range)     Initial Impression / Assessment and Plan / ED Course  I have reviewed the triage vital signs and the nursing notes.  Pertinent labs & imaging results that were available during my care of the patient were reviewed by me and considered in my medical decision making (see chart for details).     Patient presents for evaluation of PEG tube problem.  He has a history of anoxic brain injury and is minimally verbal at baseline.  He is afebrile, vital signs are stable.  He is nontoxic in appearance.  Abdomen is soft and nontender.  He has a wound to the suprapubic region with some drainage.  His PEG tube is entirely dislodged on my assessment.  2:08 PM Spoke with Sheral FlowMon[facility].  She reports that she noticed increased redness around the PEG tube site this week. They have been applying nystatin cream for the erythema.  Yesterday she noticed significant increase in leaking around the PEG tube site and today she noticed that the PEG tube had been  dislodged somewhat (she thinks she was able to visualize the balloon) and that there was bloody drainage coming out of his PEG tube.  She states the patient has a tendency to pull out his G-tube specifically when he lays prone.  She denies any fevers, change in bowel movements or urination, vomiting, or decreased appetite.  She reports that the patient is otherwise at his baseline and the wound in the suprapubic region is also unchanged.   PEG tube was replaced in the ED; confirmatory x-ray obtained which shows PEG tube tip in the stomach with no extravasated contrast seen.  Suspect that the erythema surrounding his PEG tube site is consistent with chemical dermatitis from gastric juices.  Vital signs remained stable and he does not appear to be septic at this time.  Stable for discharge back to his facility.  Patient seen and evaluated by Dr. Donnald GarrePfeiffer who agrees with assessment and plan at this time. Final Clinical Impressions(s) / ED Diagnoses   Final diagnoses:  PEG (percutaneous endoscopic gastrostomy) adjustment/replacement/removal [facility](HCC)    ED Discharge Orders    None       Jeanie SewerFawze, Shonda Mandarino A, PA-C 12/20/18 1545    Arby BarrettePfeiffer, Marcy, MD 12/30/18 1542

## 2018-12-20 NOTE — ED Notes (Signed)
ED Provider at bedside. 

## 2018-12-20 NOTE — Discharge Instructions (Signed)
Your PEG tube was replaced today and appears to be in place based on x-rays.  Try not to pull on this.  The redness around her skin is probably irritation from the chemicals from your stomach.  Keep the area clean and dry.  Return to the emergency department if any concerning signs or symptoms develop.

## 2018-12-20 NOTE — ED Notes (Signed)
[facility]

## 2018-12-20 NOTE — ED Notes (Signed)
Patient transported to X-ray 

## 2018-12-24 IMAGING — US US RENAL
1 series · 14 of 24 positions shown · non-contrast
Comparison: Abdominal MRI of [DATE]

CLINICAL DATA: Flank pain, urinary tract infection, acute renal
insufficiency. History of previous right nephrectomy.

EXAM:
RENAL / URINARY TRACT ULTRASOUND COMPLETE

[Series 1: us renal · 0.23mm/px · 14 of 24 slices shown]
[im 1/24]
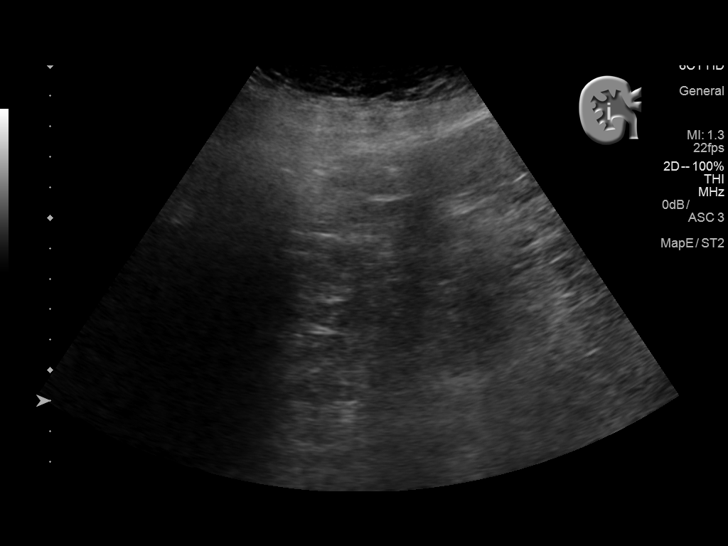
[im 3/24]
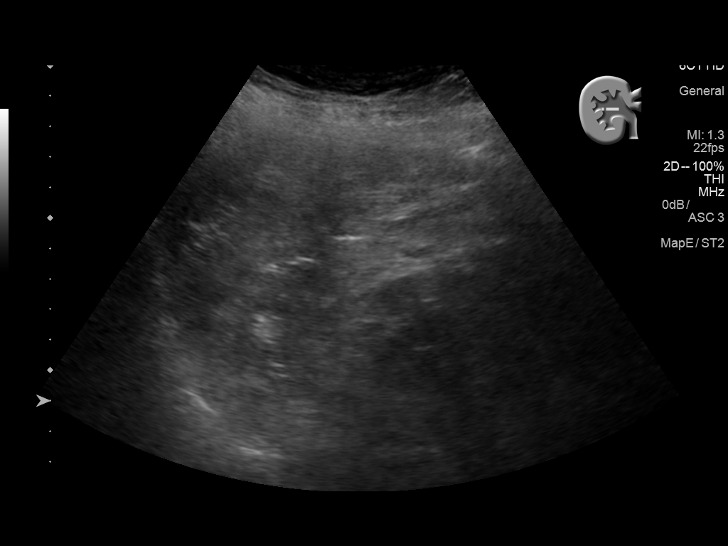
[im 5/24]
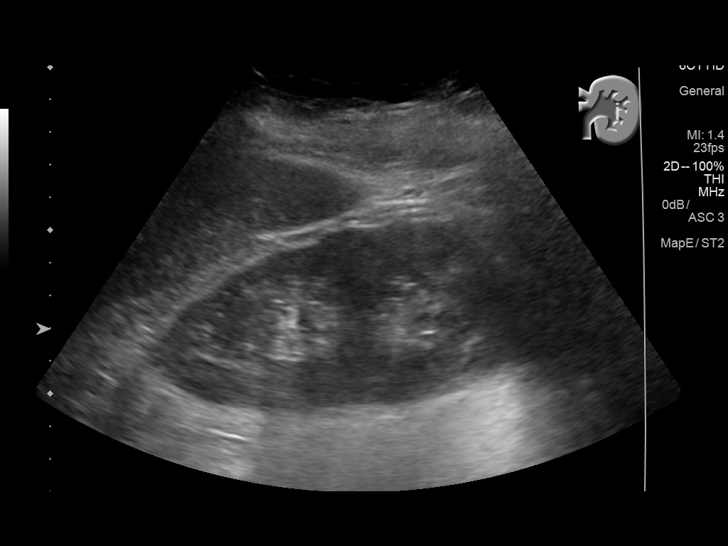
[im 7/24]
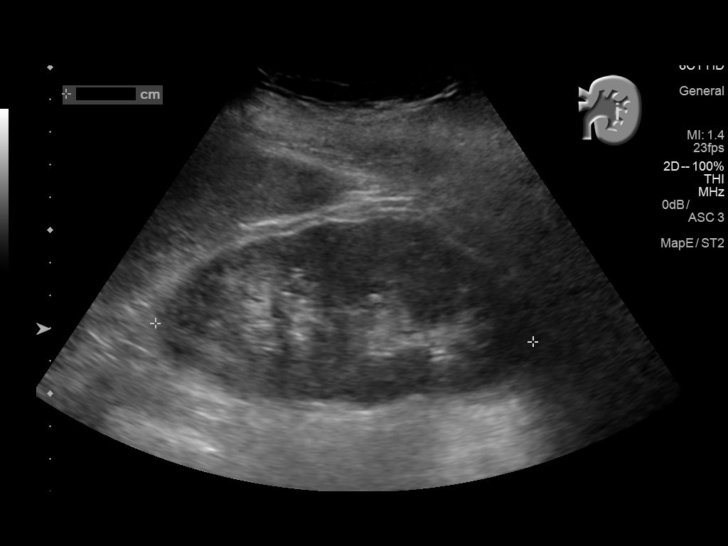
[im 8/24]
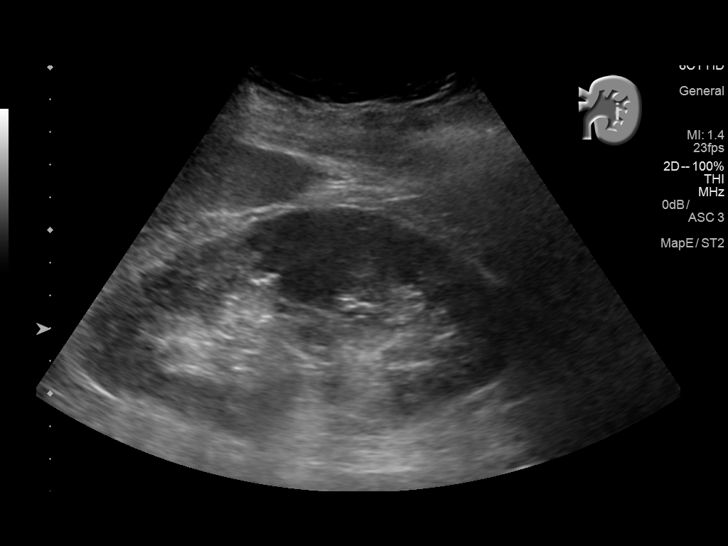
[im 10/24]
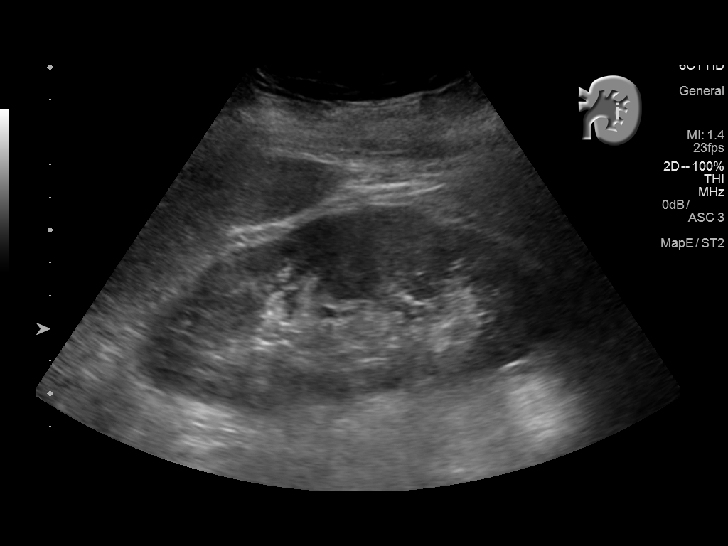
[im 12/24]
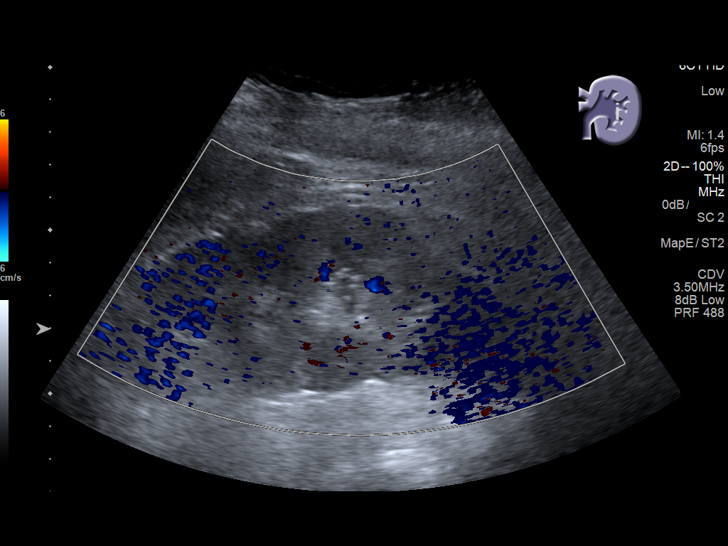
[im 13/24]
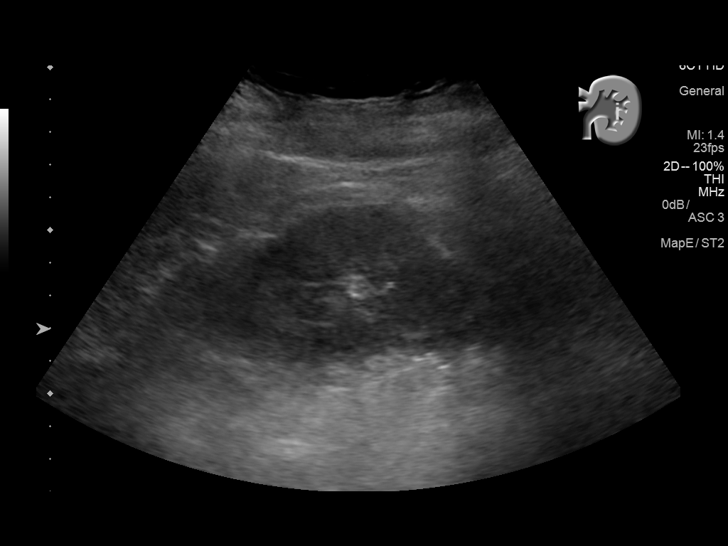
[im 15/24]
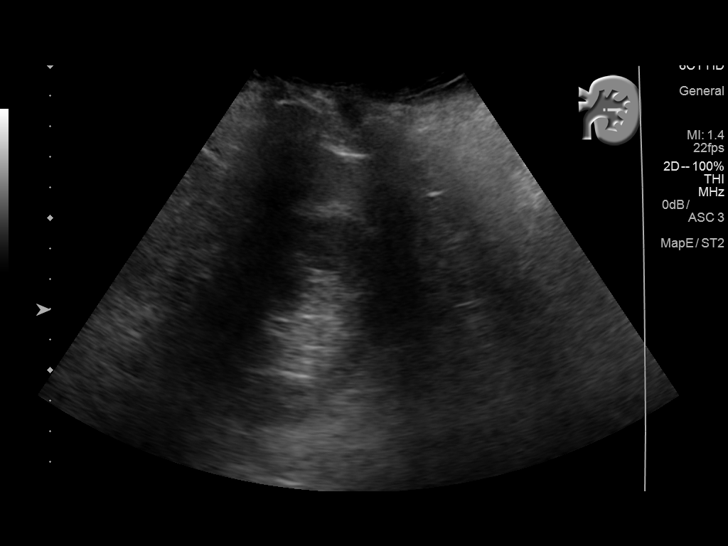
[im 17/24]
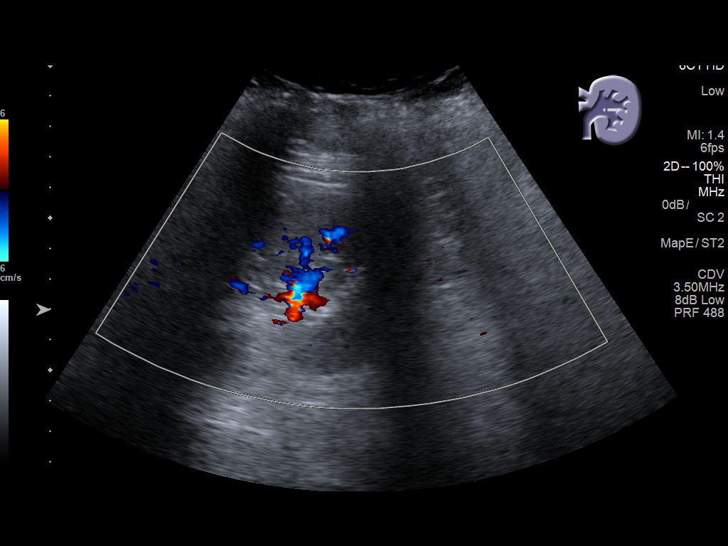
[im 19/24]
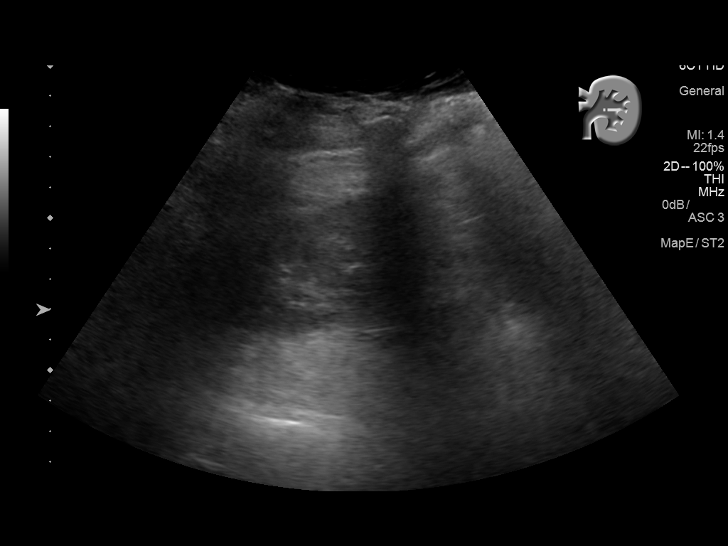
[im 20/24]
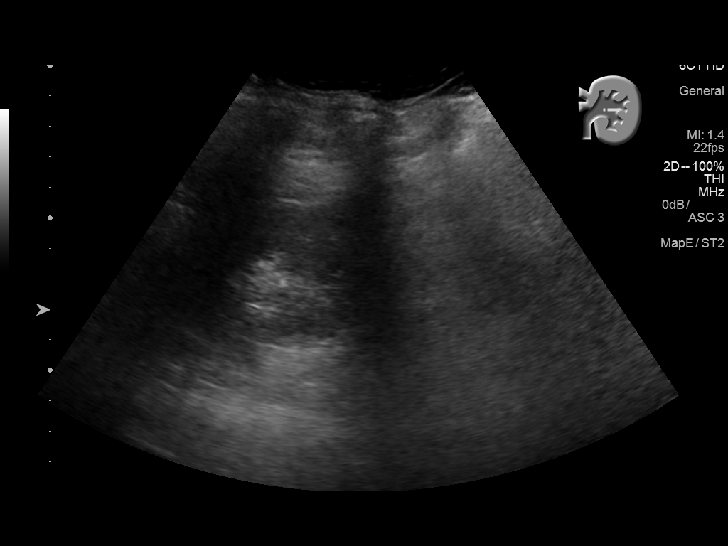
[im 22/24]
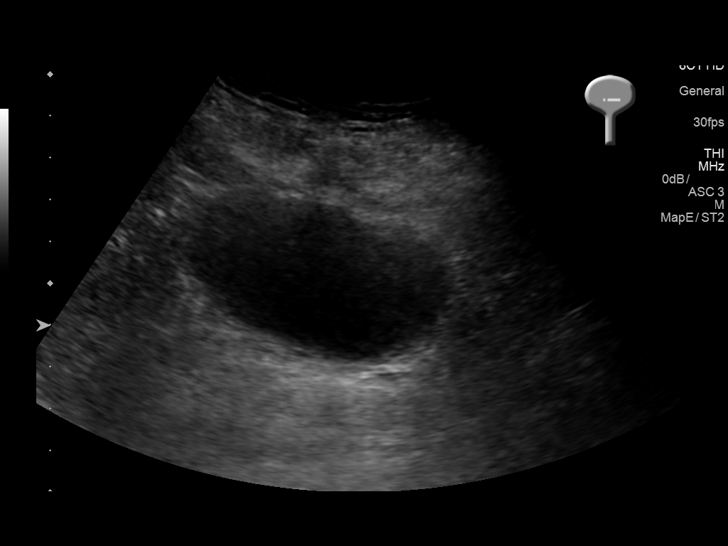
[im 24/24]
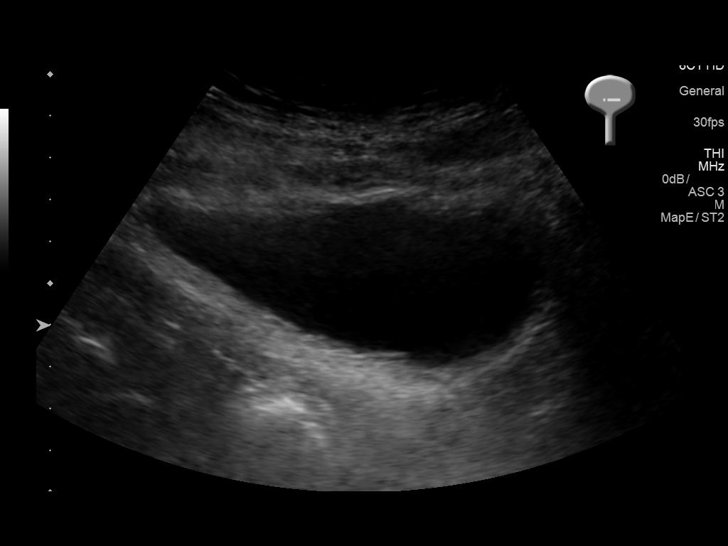

[14 of 24 positions shown; findings below may reference images not displayed]

FINDINGS: Right Kidney:

The right kidney is surgically absent.

Left Kidney:

Length: 13.3 cm. Echogenicity within normal limits. No mass or
hydronephrosis visualized.

Bladder:

Appears normal for degree of bladder distention.
IMPRESSION: Normal appearance of the left kidney and urinary bladder. Previous
right nephrectomy.

## 2019-01-22 IMAGING — CR DG ABDOMEN 1V
1 series · 1 of 1 positions shown · non-contrast
Comparison: 02/20/2018.

CLINICAL DATA: PEG tube placement.

EXAM:
ABDOMEN - 1 VIEW

[abdomen kub]
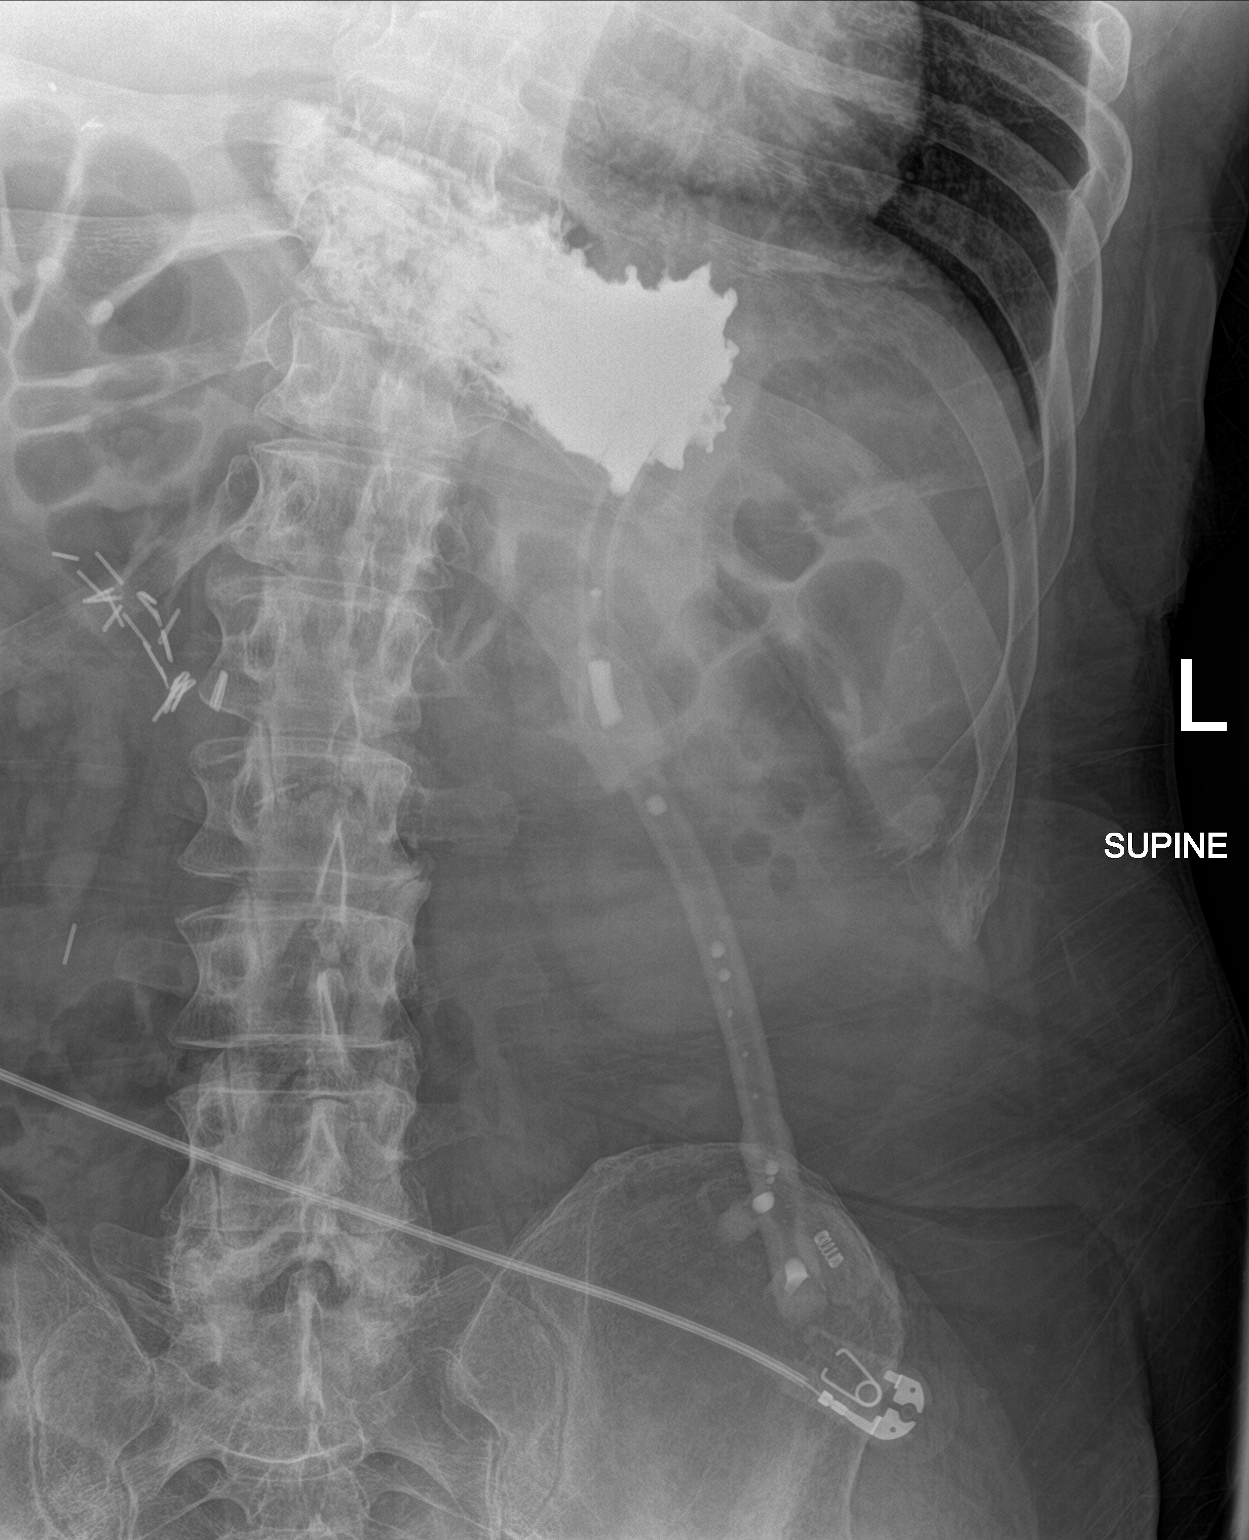

[1 of 1 positions shown; findings below may reference images not displayed]

FINDINGS: PEG 2 tip in the stomach with injected contrast in the stomach. No
extravasated contrast is seen. Right mid abdominal surgical clips
are noted. Dextroconvex thoracolumbar scoliosis, partially
positional.
IMPRESSION: PEG tube tip in the stomach.

## 2019-03-05 ENCOUNTER — Encounter (HOSPITAL_COMMUNITY): Payer: Self-pay

## 2019-03-05 ENCOUNTER — Emergency Department (HOSPITAL_COMMUNITY)
Admission: EM | Admit: 2019-03-05 | Discharge: 2019-03-05 | Disposition: A | Discharge status: Skilled Nursing Facility | Payer: Medicare Other | Attending: Emergency Medicine | Admitting: Emergency Medicine

## 2019-03-05 ENCOUNTER — Other Ambulatory Visit: Payer: Self-pay

## 2019-03-05 DIAGNOSIS — G8250 Quadriplegia, unspecified: Secondary | ICD-10-CM | POA: Diagnosis not present

## 2019-03-05 DIAGNOSIS — Z905 Acquired absence of kidney: Secondary | ICD-10-CM | POA: Diagnosis not present

## 2019-03-05 DIAGNOSIS — Z91040 Latex allergy status: Secondary | ICD-10-CM | POA: Diagnosis not present

## 2019-03-05 DIAGNOSIS — E039 Hypothyroidism, unspecified: Secondary | ICD-10-CM | POA: Diagnosis not present

## 2019-03-05 DIAGNOSIS — K9420 Gastrostomy complication, unspecified: Secondary | ICD-10-CM | POA: Insufficient documentation

## 2019-03-05 DIAGNOSIS — F0390 Unspecified dementia without behavioral disturbance: Secondary | ICD-10-CM | POA: Insufficient documentation

## 2019-03-05 DIAGNOSIS — Z79899 Other long term (current) drug therapy: Secondary | ICD-10-CM | POA: Insufficient documentation

## 2019-03-05 LAB — CBG MONITORING, ED
GLUCOSE-CAPILLARY: 59 mg/dL — AB (ref 70–99)
Glucose-Capillary: 62 mg/dL — ABNORMAL LOW (ref 70–99)
Glucose-Capillary: 80 mg/dL (ref 70–99)

## 2019-03-05 NOTE — ED Notes (Signed)
[facility] has been called  

## 2019-03-05 NOTE — Discharge Instructions (Addendum)
Place a nonstick dressing between the skin and the G tube plastic ring to minimize contact of the plastic against the skin  Also please place bacitracin twice daily after drying the skin surrounding the G-tube to act as a barrier

## 2019-03-05 NOTE — ED Notes (Signed)
ED Provider at bedside. 

## 2019-03-05 NOTE — ED Triage Notes (Signed)
Patient arrived via [facility] from [facility] facility. Patient is primarily non-verbal and is AOx1. Facility called 911 due to G-Tube possibly  being blocked and facility is also concerned about area around G-tube that appears to facility to possibly be infected. Patient has Hx of Dementia and Diabetes.

## 2019-03-05 NOTE — ED Notes (Signed)
Coke has been given to patient.

## 2019-03-05 NOTE — ED Notes (Signed)
Bed: WA21 Expected date:  Expected time:  Means of arrival:  Comments: 53 yo gtube replaced

## 2019-03-05 NOTE — ED Provider Notes (Signed)
[facility]-EMERGENCY DEPT Provider Note   CSN: [id number] Arrival date & time: 03/05/19  0755    History   Chief Complaint Chief Complaint  Patient presents with  . G Tube Blockage    Possible infection around G-Tube area per facility   Level 5 caveat: Nonverbal.  HPI [name] is a 53 y.o. male.     HPI 53 year old male brought to the emergency department from his nursing facility after his G-tube became dislodged.  They were also concerned about some possible developing erythema surrounding this area.  No other history is available at this time.  Patient unable to provide any reasonable history.  Past Medical History:  Diagnosis Date  . Addison disease (HCC)   . Anoxic brain injury (HCC)   . Anxiety   . Aspiration pneumonia (HCC)   . Atrial fibrillation (HCC)   . Chronic constipation 11/23/2011  . Dementia (HCC)   . Dysphagia   . Encephalopathy   . Glaucoma   . Glucocorticoid deficiency (HCC)   . History of recurrent UTIs   . Hyperlipemia   . Hypothyroidism   . Mental disorder   . Myocardial infarction (HCC)   . Pneumoperitoneum of unknown etiology 12/21/2011  . Quadriplegia (HCC)   . Reflux   . Seizure disorder [facility])     Patient Active Problem List   Diagnosis Date Noted  . PEG Gastrostomy tube in place (HCC) 01/26/2018  . 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 01/26/2018 01/26/2018  . Bedridden 01/26/2018  . Quadriplegia (HCC)   . Abdominal wall abscess 01/25/2018  . Fracture of humerus anatomical neck, right, closed, initial encounter 10/03/2017  . AKI (acute kidney injury) (HCC) 10/03/2017  . UTI (urinary tract infection) 10/03/2017  . Agitation 07/03/2014  . Cardiomyopathy, ischemic 06/18/2014  . 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 Acute psychosis (HCC) 04/19/2014  . Unspecified hypothyroidism 05/16/2013  . Constipation 05/16/2013  . Anemia of other chronic disease 05/16/2013  . Mental disorder 01/03/2013    . Addisons disease (HCC) 01/03/2013  . PEG (percutaneous endoscopic gastrostomy) adjustment/replacement/removal (HCC) 12/21/2011  . Hypokalemia 11/25/2011  . Thrombocytopenia (HCC) 11/25/2011  . FTT (failure to thrive) in adult 11/25/2011  . Grand mal seizure (HCC) 11/23/2011  . Depression (emotion) 11/23/2011  . History of atrial fibrillation 11/23/2011  . Other specified hemorrhagic conditions (HCC) 11/23/2011  . Glaucoma 11/23/2011  . Hypoglycemia 11/23/2011  . Chronic constipation 11/23/2011  . Anoxic brain injury (HCC) 11/12/2011  . Protein calorie malnutrition (HCC) 11/12/2011  . Hyperchloremia 11/12/2011  . Hyperthyroidism 11/12/2011  . Acute lower UTI 11/12/2011  . Dysphagia     Past Surgical History:  Procedure Laterality Date  . DEBRIDEMENT OF ABDOMINAL WALL ABSCESS  01/26/2018  . HERNIA MESH REMOVAL  01/26/2018   Infected mesh - removed w I&D abd wall abscess  . IR GENERIC HISTORICAL  10/20/2016   IR REPLC GASTRO/COLONIC TUBE PERCUT W/FLUORO 10/20/2016 Oley Balm, MD [facility]-INTERV RAD  . IR REPLACE G-TUBE SIMPLE WO FLUORO  01/28/2018  . IR REPLC GASTRO/COLONIC TUBE PERCUT W/FLUORO  04/21/2017  . IR REPLC GASTRO/COLONIC TUBE PERCUT W/FLUORO  07/22/2017  . IR REPLC GASTRO/COLONIC TUBE PERCUT W/FLUORO  11/24/2017  . IRRIGATION AND DEBRIDEMENT ABSCESS N/A 01/26/2018   Procedure: IRRIGATION AND DEBRIDEMENT ABDOMINAL WALL ABSCESS WITH REMOVAL OF INFECTED MESH;  Surgeon: Karie Soda, MD;  Location: [facility] ORS;  Service: General;  Laterality: N/A;  . NEPHRECTOMY  unknown  . PEG TUBE PLACEMENT  Home Medications    Prior to Admission medications   Medication Sig Start Date End Date Taking? Authorizing Provider  acetaminophen (TYLENOL) 325 MG tablet Place 650 mg into feeding tube every 4 (four) hours as needed for mild pain.    [provider]  Amino Acids-Protein Hydrolys (FEEDING SUPPLEMENT, PRO-STAT SUGAR FREE 64,) LIQD Place 30 mLs into feeding tube 2  (two) times daily.     [provider]  Carboxymeth-Glycerin-Polysorb (REFRESH OPTIVE ADVANCED OP) Place 1 drop into both eyes 3 (three) times daily.    [provider]  divalproex (DEPAKOTE SPRINKLE) 125 MG capsule Take 375-500 mg by mouth See admin instructions. Take 3 capsules (375mg ) via g-tube every morning, then take 4 capsules (500mg ) via g-tube every evening. 07/18/13   [provider]  levothyroxine (SYNTHROID, LEVOTHROID) 150 MCG tablet Place 150 mcg into feeding tube daily before breakfast.     [provider]  METOPROLOL SUCCINATE ER PO 12.5 mg by PEG Tube route 2 (two) times daily. Metoprolol tartrate 6.25 mg/5 ml suspension    [provider]  Multiple Vitamins-Minerals (CERTAVITE/ANTIOXIDANTS) LIQD 15 mLs by PEG Tube route daily at 12 noon.     [provider]  Nutritional Supplements (VITAL 1.5 CAL PO) Take 237 mLs by mouth as needed (For less than 50% food consumption.).    [provider]  pantoprazole sodium (PROTONIX) 40 mg/20 mL PACK 40 mg by PEG Tube route daily at 12 noon.  11/30/11   [name], MD  PARoxetine (PAXIL) 20 MG tablet Place 20 mg into feeding tube daily.  06/03/17   [provider]  polyethylene glycol (MIRALAX / GLYCOLAX) packet 17 g by PEG Tube route 2 (two) times daily. Mix 17gm  In 4-8 ounces of liquid    [provider]  risperiDONE (RISPERDAL M-TABS) 2 MG disintegrating tablet Place 2 mg under the tongue 2 (two) times daily. 05/08/17   [provider]  sennosides (SENOKOT) 8.8 MG/5ML syrup Place 15 mLs into feeding tube 2 (two) times daily.     [provider]  traZODone (DESYREL) 50 MG tablet Take 25-50 mg by mouth See admin instructions. 25mg  in am, 50mg  at bedtime. 08/02/13   [provider]    Family History History reviewed. No pertinent family history.  Social History Social History   Tobacco Use  . Smoking status: Never Smoker  . Smokeless  tobacco: Never Used  Substance Use Topics  . Alcohol use: No  . Drug use: No     Allergies   Ativan [lorazepam]; Latex; Morphine and related; Penicillins; Pyridium [phenazopyridine hcl]; and Soy allergy   Review of Systems Review of Systems  Unable to perform ROS: Patient nonverbal     Physical Exam Updated Vital Signs BP 115/67 (BP Location: Right Arm)   Pulse (!) 56   Temp 98 F (36.7 C) (Oral)   Resp 14   Ht 5\' 10"  (1.778 m)   Wt 106 kg   SpO2 97%   BMI 33.53 kg/m   Physical Exam Vitals signs and nursing note reviewed.  Constitutional:      Appearance: He is well-developed.  HENT:     Head: Normocephalic.  Neck:     Musculoskeletal: Normal range of motion.  Cardiovascular:     Rate and Rhythm: Normal rate.  Pulmonary:     Effort: Pulmonary effort is normal.  Abdominal:     General: There is no distension.     Tenderness: There is no  abdominal tenderness.     Comments: 26 French G-tube remains partially in tract at this time.  Small surrounding erythema most consistent with skin irritation and not cellulitis  Musculoskeletal: Normal range of motion.  Neurological:     Mental Status: He is alert and oriented to person, place, and time.      ED Treatments / Results  Labs (all labs ordered are listed, but only abnormal results are displayed) Labs Reviewed - No data to display  EKG None  Radiology No results found.  Procedures Gastrostomy tube replacement Date/Time: 03/05/2019 8:18 AM Performed by: Azalia Bilis, MD Authorized by: Azalia Bilis, MD     Gastrostomy tube replacement Performed by: Azalia Bilis Consent: Verbal consent obtained. Risks and benefits: risks, benefits and alternatives were discussed Required items: required blood products, implants, devices, and special equipment available Patient identity confirmed: hospital-assigned identification number Time out: Immediately prior to procedure a "time out" was called to verify the  correct patient, procedure, equipment, support staff and site/side marked as required. Preparation: Patient was prepped and draped in the usual sterile fashion. Patient tolerance: Patient tolerated the procedure well with no immediate complications.  Comments: 24 french Gastrostomy tube placed without difficulty    Medications Ordered in ED Medications - No data to display   Initial Impression / Assessment and Plan / ED Course  I have reviewed the triage vital signs and the nursing notes.  Pertinent labs & imaging results that were available during my care of the patient were reviewed by me and considered in my medical decision making (see chart for details).        G-tube placed without difficulty.  Stomach contents returned.  Abdominal exam without tenderness.  Vital signs are stable.  Area surrounding the G-tube is most consistent with skin irritation from stomach contents and not true bacterial cellulitis.  I do not think he needs antibiotics.  Recommend barrier cream such as bacitracin and barrier dressing  Final Clinical Impressions(s) / ED Diagnoses   Final diagnoses:  Complication of gastrostomy tube [facility])    ED Discharge Orders    None       Azalia Bilis, MD 03/05/19 [phone]

## 2019-07-03 ENCOUNTER — Emergency Department (HOSPITAL_COMMUNITY)
Admission: EM | Admit: 2019-07-03 | Discharge: 2019-07-04 | Disposition: A | Discharge status: Home / Self Care | Payer: Medicare Other | Attending: Emergency Medicine | Admitting: Emergency Medicine

## 2019-07-03 DIAGNOSIS — E039 Hypothyroidism, unspecified: Secondary | ICD-10-CM | POA: Insufficient documentation

## 2019-07-03 DIAGNOSIS — S5011XA Contusion of right forearm, initial encounter: Secondary | ICD-10-CM | POA: Insufficient documentation

## 2019-07-03 DIAGNOSIS — S7012XA Contusion of left thigh, initial encounter: Secondary | ICD-10-CM | POA: Insufficient documentation

## 2019-07-03 DIAGNOSIS — S40011A Contusion of right shoulder, initial encounter: Secondary | ICD-10-CM | POA: Diagnosis not present

## 2019-07-03 DIAGNOSIS — Y9289 Other specified places as the place of occurrence of the external cause: Secondary | ICD-10-CM | POA: Insufficient documentation

## 2019-07-03 DIAGNOSIS — Z79899 Other long term (current) drug therapy: Secondary | ICD-10-CM | POA: Diagnosis not present

## 2019-07-03 DIAGNOSIS — W07XXXA Fall from chair, initial encounter: Secondary | ICD-10-CM | POA: Insufficient documentation

## 2019-07-03 DIAGNOSIS — G8250 Quadriplegia, unspecified: Secondary | ICD-10-CM | POA: Diagnosis not present

## 2019-07-03 DIAGNOSIS — Y999 Unspecified external cause status: Secondary | ICD-10-CM | POA: Insufficient documentation

## 2019-07-03 DIAGNOSIS — M21921 Unspecified acquired deformity of right upper arm: Secondary | ICD-10-CM | POA: Diagnosis not present

## 2019-07-03 DIAGNOSIS — Y9389 Activity, other specified: Secondary | ICD-10-CM | POA: Diagnosis not present

## 2019-07-03 DIAGNOSIS — F0390 Unspecified dementia without behavioral disturbance: Secondary | ICD-10-CM | POA: Diagnosis not present

## 2019-07-03 DIAGNOSIS — T07XXXA Unspecified multiple injuries, initial encounter: Secondary | ICD-10-CM

## 2019-07-03 DIAGNOSIS — S0990XA Unspecified injury of head, initial encounter: Secondary | ICD-10-CM | POA: Diagnosis present

## 2019-07-03 DIAGNOSIS — S0181XA Laceration without foreign body of other part of head, initial encounter: Secondary | ICD-10-CM | POA: Insufficient documentation

## 2019-07-03 DIAGNOSIS — L02211 Cutaneous abscess of abdominal wall: Secondary | ICD-10-CM | POA: Insufficient documentation

## 2019-07-03 DIAGNOSIS — W19XXXA Unspecified fall, initial encounter: Secondary | ICD-10-CM

## 2019-07-03 NOTE — ED Provider Notes (Signed)
[facility] EM[facility]ENCY [facility]PART[facility] Provider Note   CSN: [id number] Arrival date & time: 07/03/19  2345    History   Chief Complaint Chief Complaint  Patient presents with   Fall    HPI Oren Binetimothy Vitrano is a 53 y.o. male with a h/o Addison disease, anoxic brain injury, aspiration pneumonia, A. fib, dementia, plegia, and seizure disorder who [facility] the Emergency Department [facility] after an unwitnessed fall.   Spoke with Vikki PortsValerie, patient's LPN, has been caring for him for several weeks.  She reports that the patient was sitting in his geriatric chair.  She reports that she heard a thud and turned around and saw him face down in a prone position.  She reports that he was alert and conscious.  No seizure-like activity was noted.  She reports that he is typically in a wheelchair or in his geriatric chair, but will attempt to ambulate independently at times.  He is very unsteady on his feet.  She notes that he has had 3 falls in the last 2-1/2 weeks.  She reports that he is oriented to himself and will answer questions appropriately.  He is not oriented to place or time.  She reports that he is currently receiving wound care daily for an abscess on his lower abdomen.  She also notes that sometimes his G-tube will leak.  No recent fever, chills, vomiting, or other complaints.  EMS reports that the patient was answering questions appropriately in route.  He has 2 wounds noted to the face and there is swelling around the left eye.  The patient was endorsing right-sided weakness, onset today.   In the ER, the patient is endorsing abdominal pain.  He appears to have an obvious deformity to the right shoulder.  He has multiple bruises in varying stages all over.  He is oriented to name and state.  His only complaint is abdominal pain.  He denies right-sided weakness.     The history is provided by the patient. No language interpreter was used.    Past Medical  History:  Diagnosis Date   Addison disease (HCC)    Anoxic brain injury (HCC)    Anxiety    Aspiration pneumonia (HCC)    Atrial fibrillation (HCC)    Chronic constipation 11/23/2011   Dementia (HCC)    Dysphagia    Encephalopathy    Glaucoma    Glucocorticoid deficiency (HCC)    History of recurrent UTIs    Hyperlipemia    Hypothyroidism    Mental disorder    Myocardial infarction (HCC)    Pneumoperitoneum of unknown etiology 12/21/2011   Quadriplegia (HCC)    Reflux    Seizure disorder [facility](HCC)     Patient Active Problem List   Diagnosis Date Noted   PEG Gastrostomy tube in place (HCC) 01/26/2018   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 01/26/2018 01/26/2018   Bedridden 01/26/2018   Quadriplegia (HCC)    Abdominal wall abscess 01/25/2018   Fracture of humerus anatomical neck, right, closed, initial encounter 10/03/2017   AKI (acute kidney injury) (HCC) 10/03/2017   UTI (urinary tract infection) 10/03/2017   Agitation 07/03/2014   Cardiomyopathy, ischemic 06/18/2014   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   Acute psychosis (HCC) 04/19/2014   Unspecified hypothyroidism 05/16/2013   Constipation 05/16/2013   Anemia of other chronic disease 05/16/2013   Mental disorder 01/03/2013   Addisons disease (HCC) 01/03/2013   PEG (percutaneous endoscopic gastrostomy) adjustment/replacement/removal (HCC) 12/21/2011  Hypokalemia 11/25/2011   Thrombocytopenia (HCC) 11/25/2011   FTT (failure to thrive) in adult 11/25/2011   Grand mal seizure (HCC) 11/23/2011   Depression (emotion) 11/23/2011   History of atrial fibrillation 11/23/2011   Other specified hemorrhagic conditions (HCC) 11/23/2011   Glaucoma 11/23/2011   Hypoglycemia 11/23/2011   Chronic constipation 11/23/2011   Anoxic brain injury (HCC) 11/12/2011   Protein calorie malnutrition (HCC) 11/12/2011   Hyperchloremia 11/12/2011   Hyperthyroidism 11/12/2011    Acute lower UTI 11/12/2011   Dysphagia     Past Surgical History:  Procedure Laterality Date   DEBRIDEMENT OF ABDOMINAL WALL ABSCESS  01/26/2018   HERNIA MESH REMOVAL  01/26/2018   Infected mesh - removed w I&D abd wall abscess   IR GENERIC HISTORICAL  10/20/2016   IR REPLC GASTRO/COLONIC TUBE PERCUT W/FLUORO 10/20/2016 Oley Balmaniel Hassell, MD [facility]INTER[facility]   IR REPLACE G-TUBE SIMPLE WO FLUORO  01/28/2018   IR REPLC GASTRO/COLONIC TUBE PERCUT W/FLUORO  04/21/2017   IR REPLC GASTRO/COLONIC TUBE PERCUT W/FLUORO  07/22/2017   IR REPLC GASTRO/COLONIC TUBE PERCUT W/FLUORO  11/24/2017   IRRIGATION AND DEBRIDEMENT ABSCESS N/A 01/26/2018   Procedure: IRRIGATION AND DEBRIDEMENT ABDOMINAL WALL ABSCESS WITH REMOVAL OF INFECTED MESH;  Surgeon: Karie SodaGross, Steven, MD;  Location: [facility] ORS;  Service: General;  Laterality: N/A;   NEPHRECTOMY  unknown   PEG TUBE PLACEMENT          Home Medications    Prior to Admission medications   Medication Sig Start Date End Date Taking? Authorizing Provider  acetaminophen (TYLENOL) 325 MG tablet Place 650 mg into feeding tube every 4 (four) hours as needed for mild pain.    [provider]  Amino Acids-Protein Hydrolys (FEEDING SUPPLEMENT, PRO-STAT SUGAR FREE 64,) LIQD Place 30 mLs into feeding tube 2 (two) times daily.     [provider]  Carboxymeth-Glycerin-Polysorb (REFRESH OPTIVE ADVANCED OP) Place 1 drop into both eyes 3 (three) times daily.    [provider]  divalproex (DEPAKOTE SPRINKLE) 125 MG capsule Take 375-500 mg by mouth See admin instructions. Take 3 capsules (375mg ) via g-tube every morning, then take 4 capsules (500mg ) via g-tube every evening. 07/18/13   [provider]  levothyroxine (SYNTHROID, LEVOTHROID) 150 MCG tablet Place 150 mcg into feeding tube daily before breakfast.     [provider]  METOPROLOL SUCCINATE ER PO 12.5 mg by PEG Tube route 2 (two) times daily. Metoprolol tartrate 6.25 mg/5 ml  suspension    [provider]  Multiple Vitamins-Minerals (CERTAVITE/ANTIOXIDANTS) LIQD 15 mLs by PEG Tube route daily at 12 noon.     [provider]  Nutritional Supplements (VITAL 1.5 CAL PO) Take 237 mLs by mouth as needed (For less than 50% food consumption.).    [provider]  pantoprazole sodium (PROTONIX) 40 mg/20 mL PACK 40 mg by PEG Tube route daily at 12 noon.  11/30/11   Alinda Moneyavis, Novlet J, MD  PARoxetine (PAXIL) 20 MG tablet Place 20 mg into feeding tube daily.  06/03/17   [provider]  polyethylene glycol (MIRALAX / GLYCOLAX) packet 17 g by PEG Tube route 2 (two) times daily. Mix 17gm  In 4-8 ounces of liquid    [provider]  risperiDONE (RISPERDAL M-TABS) 2 MG disintegrating tablet Place 2 mg under the tongue 2 (two) times daily. 05/08/17   [provider]  sennosides (SENOKOT) 8.8 MG/5ML syrup Place 15 mLs into feeding tube 2 (two) times daily.     [provider]  traZODone (DESYREL) 50 MG tablet Take 25-50 mg by mouth See admin instructions. 25mg  in am, 50mg  at bedtime. 08/02/13   [provider]    Family History History reviewed. No pertinent family history.  Social History Social History   Tobacco Use   Smoking status: Never Smoker   Smokeless tobacco: Never Used  Substance Use Topics   Alcohol use: No   Drug use: No     Allergies   Ativan [lorazepam], Latex, Morphine and related, Penicillins, Pyridium [phenazopyridine hcl], and Soy allergy   Review of Systems Review of Systems  Constitutional: Negative for appetite change and fever.  HENT: Negative for congestion and sinus pressure.   Eyes: Negative for visual disturbance.  Respiratory: Negative for cough, shortness of breath and wheezing.   Cardiovascular: Negative for chest pain.  Gastrointestinal: Positive for abdominal pain. Negative for blood in stool, diarrhea, nausea and vomiting.  Genitourinary: Negative for dysuria.    Musculoskeletal: Negative for back pain, myalgias, neck pain and neck stiffness.  Skin: Positive for wound. Negative for rash.  Allergic/Immunologic: Negative for immunocompromised state.  Neurological: Negative for dizziness, syncope, weakness and headaches.  Hematological: Does not bruise/bleed easily.  Psychiatric/Behavioral: Negative for confusion.   Physical Exam Updated Vital Signs BP 120/79    Pulse 78    Temp 98.5 F (36.9 C) (Oral)    Resp 13    Ht 5\' 10"  (1.778 m)    Wt 99.8 kg    SpO2 98%    BMI 31.57 kg/m   Physical Exam Vitals signs and nursing note reviewed.  Constitutional:      Appearance: He is well-developed.     Comments: Chronically ill appearing.  HENT:     Head: Normocephalic.     Comments: Hematoma and laceration noted above the left eye, wound is 2 cm in length.  There is also a laceration noted to the bridge of the nose, which appears old.  Wounds are hemostatic and appears clean.  Both are superficial.  Grinds teeth continuously throughout exam.  Eyes:     Extraocular Movements: Extraocular movements intact.     Conjunctiva/sclera: Conjunctivae normal.     Pupils: Pupils are equal, round, and reactive to light.  Neck:     Musculoskeletal: Neck supple.  Cardiovascular:     Rate and Rhythm: Normal rate and regular rhythm.     Heart sounds: No murmur.  Pulmonary:     Effort: Pulmonary effort is normal. No respiratory distress.     Breath sounds: No stridor. No wheezing, rhonchi or rales.  Chest:     Chest wall: No tenderness.  Abdominal:     General: There is no distension.     Palpations: Abdomen is soft.     Tenderness: There is abdominal tenderness.     Comments: G-tube is in place.  There appears to be redness around the site, but no warmth or red streaking.  A small amount of drainage is noted at the site.  Tender to palpation on the abdomen around the G-tube site.  No rebound or guarding.  There is a packed wound noted at the lower abdomen. A  small amount of purulent drainage is noted. Wound appears to be well-healing.    Musculoskeletal:     Comments: Obvious deformity to the right shoulder.  Decreased range of motion to the right shoulder.  Full active and passive range of motion of the bilateral wrist and elbows.  Exam of the left shoulder is normal.  He has tenderness to palpation to the midline of the thoracic and lumbar spine.  Cervical spine exam is unremarkable.  Skin:    General: Skin is warm and dry.     Comments: Multiple bruises throughout the body.  There appears to be an aged bruised over the right shoulder and right forearm.  There is a large bruise to the left thigh that is on the circumferential that appears to be several days old.  There were several other bruises noted throughout the trunk and extremities that appear to be old.  Neurological:     Mental Status: He is alert.     Comments: Oriented to person and state.  He does not know the city, year, or place. Moves all 4 extremities.  Follows simple commands.    Psychiatric:        Behavior: Behavior normal.      ED Treatments / Results  Labs (all labs ordered are listed, but only abnormal results are displayed) Labs Reviewed  CBC WITH DIFFERENTIAL/PLATELET - Abnormal; Notable for the following components:      Result Value   RBC 3.97 (*)    Hemoglobin 11.8 (*)    HCT 37.5 (*)    All other components within normal limits  COMPREHENSIVE METABOLIC PANEL - Abnormal; Notable for the following components:   Potassium 5.2 (*)    Calcium 8.6 (*)    Total Protein 5.9 (*)    Albumin 3.1 (*)    All other components within normal limits    EKG None  Radiology Dg Chest 2 View  Result Date: 07/04/2019 CLINICAL DATA:  Unwitnessed fall. EXAM: CHEST - 2 VIEW COMPARISON:  Chest x-ray dated October 04, 2018. Chest x-ray dated 10/03/2017. FINDINGS: There is persistent elevation of the right hemidiaphragm. The heart size is stable across prior studies.  There is no pneumothorax. There is no displaced fracture. There is no large pleural effusion. IMPRESSION: No active cardiopulmonary disease. Electronically Signed   By: Katherine Mantle M.D.   On: 07/04/2019 01:36   Dg Thoracic Spine 2 View  Result Date: 07/04/2019 CLINICAL DATA:  Pain status post fall EXAM: THORACIC SPINE 2 VIEWS COMPARISON:  None. FINDINGS: There is no evidence of thoracic spine fracture. There may be some mild height loss of several lower thoracic vertebral bodies, likely chronic. Alignment is normal. No other significant bone abnormalities are identified. IMPRESSION: No acute displaced fracture. Electronically Signed   By: Katherine Mantle M.D.   On: 07/04/2019 01:39   Dg Lumbar Spine Complete  Result Date: 07/04/2019 CLINICAL DATA:  Fall. EXAM: LUMBAR SPINE - COMPLETE 4+ VIEW COMPARISON:  May 24, 2018 FINDINGS: There is no displaced fracture. The alignment appears unremarkable. There are multilevel degenerative changes throughout the lumbar spine, greatest at the lower lumbar segments. There is multilevel facet arthrosis. There is disc height loss at the L5-S1 level. IMPRESSION: No acute osseous abnormality. Electronically Signed   By: Katherine Mantle M.D.   On: 07/04/2019 01:40   Dg Shoulder Right  Result Date: 07/04/2019 CLINICAL DATA:  Pain status post fall EXAM: RIGHT SHOULDER - 2+ VIEW COMPARISON:  Chest x-ray dated 10/04/2018 FINDINGS: There is a chronic deformity of the proximal right humerus, similar to prior studies. There is no evidence of a displaced fracture. No definite dislocation. IMPRESSION: Chronic deformity of the proximal right humerus without evidence of an acute displaced fracture or dislocation. Electronically Signed   By: Katherine Mantle M.D.   On: 07/04/2019 01:37   Ct  Head Wo Contrast  Result Date: 07/04/2019 CLINICAL DATA:  Post unwitnessed. Laceration above Maxface trauma blunt; Neck pain, initial exam EXAM: CT HEAD WITHOUT CONTRAST CT  MAXILLOFACIAL WITHOUT CONTRAST CT CERVICAL SPINE WITHOUT CONTRAST TECHNIQUE: Multidetector CT imaging of the head, cervical spine, and maxillofacial structures were performed using the standard protocol without intravenous contrast. Multiplanar CT image reconstructions of the cervical spine and maxillofacial structures were also generated. COMPARISON:  Head and cervical spine CT 10/04/2018 FINDINGS: CT HEAD FINDINGS Brain: No intracranial hemorrhage, mass effect, or midline shift. Generalized atrophy, stable but age advanced. No hydrocephalus. The basilar cisterns are patent. No evidence of territorial infarct or acute ischemia. No extra-axial or intracranial fluid collection. Vascular: No hyperdense vessel Skull: No fracture or focal lesion. Other: None. CT MAXILLOFACIAL FINDINGS Osseous: Motion artifact limitations. Zygomatic arches are intact. Remote nasal bone fracture. Mandibles are intact. Orbits: No orbital fracture. Both orbits and globes are intact. Sinuses: No sinus fracture or fluid level. Scattered mucosal thickening. Soft tissues: Negative. Left supraorbital laceration described in clinical notes not visualized. CT CERVICAL SPINE FINDINGS Alignment: Reversal of normal lordosis. No traumatic subluxation. Skull base and vertebrae: No acute fracture. Vertebral body heights are maintained. The dens and skull base are intact. Soft tissues and spinal canal: No prevertebral fluid or swelling. No visible canal hematoma. Disc levels: Multilevel degenerative disc disease and facet hypertrophy, unchanged prior. Upper chest: No acute findings. Other: None. IMPRESSION: 1. No acute intracranial abnormality. No skull fracture. 2. No acute facial bone fracture.  Remote nasal bone fracture. 3. No fracture or subluxation of the cervical spine. Electronically Signed   By: Narda Rutherford M.D.   On: 07/04/2019 01:34   Ct Cervical Spine Wo Contrast  Result Date: 07/04/2019 CLINICAL DATA:  Post unwitnessed. Laceration  above Maxface trauma blunt; Neck pain, initial exam EXAM: CT HEAD WITHOUT CONTRAST CT MAXILLOFACIAL WITHOUT CONTRAST CT CERVICAL SPINE WITHOUT CONTRAST TECHNIQUE: Multidetector CT imaging of the head, cervical spine, and maxillofacial structures were performed using the standard protocol without intravenous contrast. Multiplanar CT image reconstructions of the cervical spine and maxillofacial structures were also generated. COMPARISON:  Head and cervical spine CT 10/04/2018 FINDINGS: CT HEAD FINDINGS Brain: No intracranial hemorrhage, mass effect, or midline shift. Generalized atrophy, stable but age advanced. No hydrocephalus. The basilar cisterns are patent. No evidence of territorial infarct or acute ischemia. No extra-axial or intracranial fluid collection. Vascular: No hyperdense vessel Skull: No fracture or focal lesion. Other: None. CT MAXILLOFACIAL FINDINGS Osseous: Motion artifact limitations. Zygomatic arches are intact. Remote nasal bone fracture. Mandibles are intact. Orbits: No orbital fracture. Both orbits and globes are intact. Sinuses: No sinus fracture or fluid level. Scattered mucosal thickening. Soft tissues: Negative. Left supraorbital laceration described in clinical notes not visualized. CT CERVICAL SPINE FINDINGS Alignment: Reversal of normal lordosis. No traumatic subluxation. Skull base and vertebrae: No acute fracture. Vertebral body heights are maintained. The dens and skull base are intact. Soft tissues and spinal canal: No prevertebral fluid or swelling. No visible canal hematoma. Disc levels: Multilevel degenerative disc disease and facet hypertrophy, unchanged prior. Upper chest: No acute findings. Other: None. IMPRESSION: 1. No acute intracranial abnormality. No skull fracture. 2. No acute facial bone fracture.  Remote nasal bone fracture. 3. No fracture or subluxation of the cervical spine. Electronically Signed   By: Narda Rutherford M.D.   On: 07/04/2019 01:34   Ct Maxillofacial  Wo Contrast  Result Date: 07/04/2019 CLINICAL DATA:  Post unwitnessed. Laceration above Maxface trauma blunt; Neck  pain, initial exam EXAM: CT HEAD WITHOUT CONTRAST CT MAXILLOFACIAL WITHOUT CONTRAST CT CERVICAL SPINE WITHOUT CONTRAST TECHNIQUE: Multidetector CT imaging of the head, cervical spine, and maxillofacial structures were performed using the standard protocol without intravenous contrast. Multiplanar CT image reconstructions of the cervical spine and maxillofacial structures were also generated. COMPARISON:  Head and cervical spine CT 10/04/2018 FINDINGS: CT HEAD FINDINGS Brain: No intracranial hemorrhage, mass effect, or midline shift. Generalized atrophy, stable but age advanced. No hydrocephalus. The basilar cisterns are patent. No evidence of territorial infarct or acute ischemia. No extra-axial or intracranial fluid collection. Vascular: No hyperdense vessel Skull: No fracture or focal lesion. Other: None. CT MAXILLOFACIAL FINDINGS Osseous: Motion artifact limitations. Zygomatic arches are intact. Remote nasal bone fracture. Mandibles are intact. Orbits: No orbital fracture. Both orbits and globes are intact. Sinuses: No sinus fracture or fluid level. Scattered mucosal thickening. Soft tissues: Negative. Left supraorbital laceration described in clinical notes not visualized. CT CERVICAL SPINE FINDINGS Alignment: Reversal of normal lordosis. No traumatic subluxation. Skull base and vertebrae: No acute fracture. Vertebral body heights are maintained. The dens and skull base are intact. Soft tissues and spinal canal: No prevertebral fluid or swelling. No visible canal hematoma. Disc levels: Multilevel degenerative disc disease and facet hypertrophy, unchanged prior. Upper chest: No acute findings. Other: None. IMPRESSION: 1. No acute intracranial abnormality. No skull fracture. 2. No acute facial bone fracture.  Remote nasal bone fracture. 3. No fracture or subluxation of the cervical spine.  Electronically Signed   By: Narda Rutherford M.D.   On: 07/04/2019 01:34    Procedures .Marland KitchenLaceration Repair  Date/Time: 07/04/2019 7:36 AM Performed by: Barkley Boards, PA-C Authorized by: Barkley Boards, PA-C   Consent:    Consent obtained:  Verbal   Consent given by:  Patient   Risks discussed:  Infection, need for additional repair, poor cosmetic result, tendon damage, vascular damage, poor wound healing and nerve damage Anesthesia (see MAR for exact dosages):    Anesthesia method:  Local infiltration   Local anesthetic:  Lidocaine 2% WITH epi Laceration details:    Location:  Face   Face location:  Forehead (superior to right eyebrow)   Length (cm):  2 Repair type:    Repair type:  Simple Pre-procedure details:    Preparation:  Patient was prepped and draped in usual sterile fashion and imaging obtained to evaluate for foreign bodies Exploration:    Wound exploration: wound explored through full range of motion and entire depth of wound probed and visualized     Wound extent: no fascia violation noted, no foreign bodies/material noted, no muscle damage noted, no nerve damage noted, no tendon damage noted, no underlying fracture noted and no vascular damage noted   Treatment:    Area cleansed with:  Soap and water and saline   Amount of cleaning:  Standard   Visualized foreign bodies/material removed: no   Skin repair:    Repair method:  Sutures   Suture size:  5-0   Suture material:  Prolene   Suture technique:  Simple interrupted   Number of sutures:  4 Approximation:    Approximation:  Close Post-procedure details:    Dressing:  Open (no dressing)   Patient tolerance of procedure:  Tolerated well, no immediate complications   (including critical care time)  Medications Ordered in ED Medications  lidocaine-EPINEPHrine (XYLOCAINE W/EPI) 2 %-1:200000 (PF) injection 10 mL (10 mLs Infiltration Given 07/04/19 0530)     Initial Impression / Assessment and  Plan / ED  Course  I have reviewed the triage vital signs and the nursing notes.  Pertinent labs & imaging results that were available during my care of the patient were reviewed by me and considered in my medical decision making (see chart for details).        52 year old male with a h/o Addison disease, anoxic brain injury, aspiration pneumonia, A. fib, dementia, plegia, and seizure disorder presenting from [facility] by EMS after an unwitnessed fall from his geriatric chair to the floor.  Patient was seen with immediately by staff and did not appear to have a syncopal episode or noted seizure activity.  On exam, patient has a deformity to the right shoulder, which is noted to be chronic on x-ray.  He has multiple bruises from head to toe in varying stages of healing.  He has 2 lacerations to the face.  The superficial laceration on the left forehead appears to be new and is 2 cm in length.  Laceration repair performed by me.  Wound on the bridge of the nose appears to be old and is not amenable to laceration repair.    Imaging performed today is negative for acute injury.  Potassium was noted to be slightly elevated, but hemolyzed. No chest pain. Labs are at the patient's baseline and not concerning for new infection around the patient's G-tube.  He has an abscess to the abdomen that is currently being managed by wound care at [facility].  Nursing staff reports the packing is being changed daily.  The patient was seen and independently evaluated by Dr. Dina Rich, attending physician.  He has had no seizure-like activity after being monitored for several hours in the ER.  Clinical status is remained unchanged.  Wound care instructions given.  Return precautions the ER given.  He is safe for discharge to [facility] at this time.  Final Clinical Impressions(s) / ED Diagnoses   Final diagnoses:  Fall, initial encounter  Facial laceration, initial encounter  Abdominal wall abscess  Acquired deformity of  right upper arm  Multiple bruises    ED Discharge Orders    None       Joanne Gavel, PA-C 07/04/19 0742    Merryl Hacker, MD 07/08/19 [phone]

## 2019-07-03 NOTE — ED Triage Notes (Signed)
Pt presents to ED from [facility] for a fall.  Pt had unwitnessed fall, found face down on his belly.  Pt has a laceration above left eye approx 2 inches in length with clean edges.  Right shoulder deformity noted with Pt is Alert and oriented at baseline. VSS upon arrival to ED.

## 2019-07-04 ENCOUNTER — Other Ambulatory Visit: Payer: Self-pay

## 2019-07-04 ENCOUNTER — Emergency Department (HOSPITAL_COMMUNITY)
Admission: EM | Admit: 2019-07-04 | Discharge: 2019-07-04 | Disposition: A | Discharge status: Home / Self Care | Payer: Medicare Other | Source: Home / Self Care | Attending: Emergency Medicine | Admitting: Emergency Medicine

## 2019-07-04 ENCOUNTER — Encounter (HOSPITAL_COMMUNITY): Payer: Self-pay

## 2019-07-04 ENCOUNTER — Encounter (HOSPITAL_COMMUNITY): Payer: Self-pay | Admitting: Emergency Medicine

## 2019-07-04 ENCOUNTER — Emergency Department (HOSPITAL_COMMUNITY): Payer: Medicare Other

## 2019-07-04 DIAGNOSIS — W19XXXA Unspecified fall, initial encounter: Secondary | ICD-10-CM

## 2019-07-04 DIAGNOSIS — S0181XA Laceration without foreign body of other part of head, initial encounter: Secondary | ICD-10-CM | POA: Diagnosis not present

## 2019-07-04 LAB — COMPREHENSIVE METABOLIC PANEL
ALT: 12 U/L (ref 0–44)
AST: 29 U/L (ref 15–41)
Albumin: 3.1 g/dL — ABNORMAL LOW (ref 3.5–5.0)
Alkaline Phosphatase: 58 U/L (ref 38–126)
Anion gap: 9 (ref 5–15)
BUN: 15 mg/dL (ref 6–20)
CO2: 23 mmol/L (ref 22–32)
Calcium: 8.6 mg/dL — ABNORMAL LOW (ref 8.9–10.3)
Chloride: 106 mmol/L (ref 98–111)
Creatinine, Ser: 0.91 mg/dL (ref 0.61–1.24)
GFR calc Af Amer: >60 mL/min (ref >60)
GFR calc non Af Amer: >60 mL/min (ref >60)
Glucose, Bld: 79 mg/dL (ref 70–99)
Potassium: 5.2 mmol/L — ABNORMAL HIGH (ref 3.5–5.1)
Sodium: 138 mmol/L (ref 135–145)
Total Bilirubin: 1 mg/dL (ref 0.3–1.2)
Total Protein: 5.9 g/dL — ABNORMAL LOW (ref 6.5–8.1)

## 2019-07-04 LAB — CBC WITH DIFFERENTIAL/PLATELET
Abs Immature Granulocytes: 0.04 10*3/uL (ref 0.00–0.07)
Basophils Absolute: 0 10*3/uL (ref 0.0–0.1)
Basophils Relative: 0 %
Eosinophils Absolute: 0.2 10*3/uL (ref 0.0–0.5)
Eosinophils Relative: 3 %
HCT: 37.5 % — ABNORMAL LOW (ref 39.0–52.0)
Hemoglobin: 11.8 g/dL — ABNORMAL LOW (ref 13.0–17.0)
Immature Granulocytes: 1 %
Lymphocytes Relative: 27 %
Lymphs Abs: 2 10*3/uL (ref 0.7–4.0)
MCH: 29.7 pg (ref 26.0–34.0)
MCHC: 31.5 g/dL (ref 30.0–36.0)
MCV: 94.5 fL (ref 80.0–100.0)
Monocytes Absolute: 0.6 10*3/uL (ref 0.1–1.0)
Monocytes Relative: 8 %
Neutro Abs: 4.7 10*3/uL (ref 1.7–7.7)
Neutrophils Relative %: 61 %
Platelets: 159 10*3/uL (ref 150–400)
RBC: 3.97 MIL/uL — ABNORMAL LOW (ref 4.22–5.81)
RDW: 13.1 % (ref 11.5–15.5)
WBC: 7.5 10*3/uL (ref 4.0–10.5)
nRBC: 0 % (ref 0.0–0.2)

## 2019-07-04 MED ORDER — LIDOCAINE-EPINEPHRINE (PF) 2 %-1:200000 IJ SOLN
10.0000 mL | Freq: Once | INTRAMUSCULAR | Status: AC
  Administered 2019-07-04: 10 mL
  Filled 2019-07-04: qty 20

## 2019-07-04 MED ORDER — RISPERIDONE 1 MG PO TBDP
1.0000 mg | ORAL_TABLET | Freq: Three times a day (TID) | ORAL | Status: DC
  Administered 2019-07-04: 1 mg via SUBLINGUAL
  Filled 2019-07-04 (×2): qty 1

## 2019-07-04 MED ORDER — BACITRACIN ZINC 500 UNIT/GM EX OINT
TOPICAL_OINTMENT | Freq: Once | CUTANEOUS | Status: AC
  Administered 2019-07-04: 16:00:00 via TOPICAL

## 2019-07-04 NOTE — ED Notes (Signed)
ED TO INPATIENT HANDOFF REPORT  ED Nurse Name and Phone #:   S Name/Age/Gender Dannielle Strickland 53 y.o. male Room/Bed: 023C/023C  Code Status   Code Status: Prior  Home/SNF/Other Nursing Home Patient oriented to: self Is this baseline? Yes   Triage Complete: Triage complete  Chief Complaint AMS   Triage Note Pt presents to ED from [facility] for a fall.  Pt had unwitnessed fall, found face down on his belly.  Pt has a laceration above left eye approx 2 inches in length with clean edges.  Right shoulder deformity noted with Pt is Alert and oriented at baseline. VSS upon arrival to ED.    Allergies Allergies  Allergen Reactions  . Ativan [Lorazepam] Other (See Comments)    Unknown (not listed on physician's orders from [facility] 07/05/15)  . Latex Dermatitis  . Morphine And Related Other (See Comments)    Listed on physician's orders from [facility] 07/05/15  . Penicillins Other (See Comments)    Patient tolerated cephalosporins in past.  Allergy listed on physician's orders from [facility] 07/05/15 Has patient had a PCN reaction causing immediate rash, facial/tongue/throat swelling, SOB or lightheadedness with hypotension: unknown Has patient had a PCN reaction causing severe rash involving mucus membranes or skin necrosis: unknown Has patient had a PCN reaction that required hospitalization: no Has patient had a PCN reaction occurring within the last 10 years: yes If all of the above answe  . Pyridium [Phenazopyridine Hcl] Other (See Comments)    Listed on physician's orders from [facility] 07/05/15  . Soy Allergy Other (See Comments)    Listed on physician's orders from [facility] 07/05/15    Level of Care/Admitting Diagnosis ED Disposition    ED Disposition Condition Comment   Discharge  The patient appears reasonably screened and/or stabilized for discharge and I doubt any other medical condition or other [facility] requiring further screening, evaluation, or treatment in the  ED exists or is present at this time prior to discharge.       B Medical/Surgery History Past Medical History:  Diagnosis Date  . Addison disease ([place])   . Anoxic brain injury ([place])   . Anxiety   . Aspiration pneumonia ([place])   . Atrial fibrillation ([place])   . Chronic constipation 11/23/2011  . Dementia ([place])   . Dysphagia   . Encephalopathy   . Glaucoma   . Glucocorticoid deficiency ([place])   . History of recurrent UTIs   . Hyperlipemia   . Hypothyroidism   . Mental disorder   . Myocardial infarction ([place])   . Pneumoperitoneum of unknown etiology 12/21/2011  . Quadriplegia ([place])   . Reflux   . Seizure disorder [facility])    Past Surgical History:  Procedure Laterality Date  . DEBRIDEMENT OF ABDOMINAL WALL ABSCESS  01/26/2018  . HERNIA MESH REMOVAL  01/26/2018   Infected mesh - removed w I&D abd wall abscess  . IR GENERIC HISTORICAL  10/20/2016   IR [facility] TUBE PERCUT W/FLUORO 10/20/2016 Arne Cleveland, MD [facility]-INTERV RAD  . IR REPLACE G-TUBE SIMPLE WO FLUORO  01/28/2018  . IR [place] TUBE PERCUT W/FLUORO  04/21/2017  . IR [facility] GASTRO/COLONIC TUBE PERCUT W/FLUORO  07/22/2017  . IR [place] PERCUT W/FLUORO  11/24/2017  . IRRIGATION AND DEBRIDEMENT ABSCESS N/A 01/26/2018   Procedure: IRRIGATION AND DEBRIDEMENT ABDOMINAL WALL ABSCESS WITH REMOVAL OF INFECTED MESH;  Surgeon: Michael Boston, MD;  Location: [facility] ORS;  Service: General;  Laterality: N/A;  .  NEPHRECTOMY  unknown  . PEG TUBE PLACEMENT       A IV Location/Drains/Wounds Patient Lines/Drains/Airways Status   Active Line/Drains/Airways    Name:   Placement date:   Placement time:   Site:   Days:   Peripheral IV 07/04/19 Left Antecubital   07/04/19    0212    Antecubital   less than 1   Gastrostomy/Enterostomy Gastrostomy 24 Fr. LUQ   03/05/19    0857    LUQ   121   Incision (Closed) 01/26/18 Abdomen Other (Comment)   01/26/18    1849     524   Pressure Ulcer 12/22/11 reddend, skin  break down, minimal drainage present. no odor   12/22/11    1030     2751   Wound 11/13/11 Blister (Serous filled);Abrasion(s);Other (Comment) Arm Right;Anterior Rt AC IV infiltrated causing red area with small blisters    11/13/11    2300    Arm   2790   Wound / Incision (Open or Dehisced) 10/03/17 Non-pressure wound Abdomen Mid;Lower   10/03/17    0400    Abdomen   639   Wound / Incision (Open or Dehisced) 01/26/18 Non-pressure wound Abdomen Anterior AROUND PEG SITE   01/26/18    0753    Abdomen   524   Wound / Incision (Open or Dehisced) 01/26/18 Non-pressure wound Abdomen Mid;Lower   01/26/18    0755    Abdomen   524          Intake/Output Last 24 hours No intake or output data in the 24 hours ending 07/04/19 [id number]  Labs/Imaging Results for orders placed or performed during the hospital encounter of 07/03/19 (from the past 48 hour(s))  CBC with Differential     Status: Abnormal   Collection Time: 07/04/19  1:34 AM  Result Value Ref Range   WBC 7.5 4.0 - 10.5 K/uL   RBC 3.97 (L) 4.22 - 5.81 MIL/uL   Hemoglobin 11.8 (L) 13.0 - 17.0 g/dL   HCT 47.837.5 (L) 29.539.0 - 62.152.0 %   MCV 94.5 80.0 - 100.0 fL   MCH 29.7 26.0 - 34.0 pg   MCHC 31.5 30.0 - 36.0 g/dL   RDW 30.813.1 65.711.5 - 84.615.5 %   Platelets 159 150 - 400 K/uL   nRBC 0.0 0.0 - 0.2 %   Neutrophils Relative % 61 %   Neutro Abs 4.7 1.7 - 7.7 K/uL   Lymphocytes Relative 27 %   Lymphs Abs 2.0 0.7 - 4.0 K/uL   Monocytes Relative 8 %   Monocytes Absolute 0.6 0.1 - 1.0 K/uL   Eosinophils Relative 3 %   Eosinophils Absolute 0.2 0.0 - 0.5 K/uL   Basophils Relative 0 %   Basophils Absolute 0.0 0.0 - 0.1 K/uL   Immature Granulocytes 1 %   Abs Immature Granulocytes 0.04 0.00 - 0.07 K/uL    Comment: Performed at [facility], [address] [place], [place] [id number]  Comprehensive metabolic panel     Status: Abnormal   Collection Time: 07/04/19  1:34 AM  Result Value Ref Range   Sodium 138 135 - 145 mmol/L   Potassium 5.2 (H) 3.5 - 5.1  mmol/L    Comment: SLIGHT HEMOLYSIS   Chloride 106 98 - 111 mmol/L   CO2 23 22 - 32 mmol/L   Glucose, Bld 79 70 - 99 mg/dL   BUN 15 6 - 20 mg/dL   Creatinine, Ser 2.840.91 0.61 - 1.24 mg/dL  Calcium 8.6 (L) 8.9 - 10.3 mg/dL   Total Protein 5.9 (L) 6.5 - 8.1 g/dL   Albumin 3.1 (L) 3.5 - 5.0 g/dL   AST 29 15 - 41 U/L   ALT 12 0 - 44 U/L   Alkaline Phosphatase 58 38 - 126 U/L   Total Bilirubin 1.0 0.3 - 1.2 mg/dL   GFR calc non Af Amer >60 >60 mL/min   GFR calc Af Amer >60 >60 mL/min   Anion gap 9 5 - 15    Comment: Performed at [facility], [address], [place], [place] [address]   Dg Chest 2 View  Result Date: 07/04/2019 CLINICAL DATA:  Unwitnessed fall. EXAM: CHEST - 2 VIEW COMPARISON:  Chest x-ray dated October 04, 2018. Chest x-ray dated 10/03/2017. FINDINGS: There is persistent elevation of the right hemidiaphragm. The heart size is stable across prior studies. There is no pneumothorax. There is no displaced fracture. There is no large pleural effusion. IMPRESSION: No active cardiopulmonary disease. Electronically Signed   By: Katherine Mantle M.D.   On: 07/04/2019 01:36   Dg Thoracic Spine 2 View  Result Date: 07/04/2019 CLINICAL DATA:  Pain status post fall EXAM: THORACIC SPINE 2 VIEWS COMPARISON:  None. FINDINGS: There is no evidence of thoracic spine fracture. There may be some mild height loss of several lower thoracic vertebral bodies, likely chronic. Alignment is normal. No other significant bone abnormalities are identified. IMPRESSION: No acute displaced fracture. Electronically Signed   By: Katherine Mantle M.D.   On: 07/04/2019 01:39   Dg Lumbar Spine Complete  Result Date: 07/04/2019 CLINICAL DATA:  Fall. EXAM: LUMBAR SPINE - COMPLETE 4+ VIEW COMPARISON:  May 24, 2018 FINDINGS: There is no displaced fracture. The alignment appears unremarkable. There are multilevel degenerative changes throughout the lumbar spine, greatest at the lower lumbar segments. There is  multilevel facet arthrosis. There is disc height loss at the L5-S1 level. IMPRESSION: No acute osseous abnormality. Electronically Signed   By: Katherine Mantle M.D.   On: 07/04/2019 01:40   Dg Shoulder Right  Result Date: 07/04/2019 CLINICAL DATA:  Pain status post fall EXAM: RIGHT SHOULDER - 2+ VIEW COMPARISON:  Chest x-ray dated 10/04/2018 FINDINGS: There is a chronic deformity of the proximal right humerus, similar to prior studies. There is no evidence of a displaced fracture. No definite dislocation. IMPRESSION: Chronic deformity of the proximal right humerus without evidence of an acute displaced fracture or dislocation. Electronically Signed   By: Katherine Mantle M.D.   On: 07/04/2019 01:37   Ct Head Wo Contrast  Result Date: 07/04/2019 CLINICAL DATA:  Post unwitnessed. Laceration above Maxface trauma blunt; Neck pain, initial exam EXAM: CT HEAD WITHOUT CONTRAST CT MAXILLOFACIAL WITHOUT CONTRAST CT CERVICAL SPINE WITHOUT CONTRAST TECHNIQUE: Multidetector CT imaging of the head, cervical spine, and maxillofacial structures were performed using the standard protocol without intravenous contrast. Multiplanar CT image reconstructions of the cervical spine and maxillofacial structures were also generated. COMPARISON:  Head and cervical spine CT 10/04/2018 FINDINGS: CT HEAD FINDINGS Brain: No intracranial hemorrhage, mass effect, or midline shift. Generalized atrophy, stable but age advanced. No hydrocephalus. The basilar cisterns are patent. No evidence of territorial infarct or acute ischemia. No extra-axial or intracranial fluid collection. Vascular: No hyperdense vessel Skull: No fracture or focal lesion. Other: None. CT MAXILLOFACIAL FINDINGS Osseous: Motion artifact limitations. Zygomatic arches are intact. Remote nasal bone fracture. Mandibles are intact. Orbits: No orbital fracture. Both orbits and globes are intact. Sinuses: No sinus  fracture or fluid level. Scattered mucosal thickening. Soft  tissues: Negative. Left supraorbital laceration described in clinical notes not visualized. CT CERVICAL SPINE FINDINGS Alignment: Reversal of normal lordosis. No traumatic subluxation. Skull base and vertebrae: No acute fracture. Vertebral body heights are maintained. The dens and skull base are intact. Soft tissues and spinal canal: No prevertebral fluid or swelling. No visible canal hematoma. Disc levels: Multilevel degenerative disc disease and facet hypertrophy, unchanged prior. Upper chest: No acute findings. Other: None. IMPRESSION: 1. No acute intracranial abnormality. No skull fracture. 2. No acute facial bone fracture.  Remote nasal bone fracture. 3. No fracture or subluxation of the cervical spine. Electronically Signed   By: Narda RutherfordMelanie  Sanford M.D.   On: 07/04/2019 01:34   Ct Cervical Spine Wo Contrast  Result Date: 07/04/2019 CLINICAL DATA:  Post unwitnessed. Laceration above Maxface trauma blunt; Neck pain, initial exam EXAM: CT HEAD WITHOUT CONTRAST CT MAXILLOFACIAL WITHOUT CONTRAST CT CERVICAL SPINE WITHOUT CONTRAST TECHNIQUE: Multidetector CT imaging of the head, cervical spine, and maxillofacial structures were performed using the standard protocol without intravenous contrast. Multiplanar CT image reconstructions of the cervical spine and maxillofacial structures were also generated. COMPARISON:  Head and cervical spine CT 10/04/2018 FINDINGS: CT HEAD FINDINGS Brain: No intracranial hemorrhage, mass effect, or midline shift. Generalized atrophy, stable but age advanced. No hydrocephalus. The basilar cisterns are patent. No evidence of territorial infarct or acute ischemia. No extra-axial or intracranial fluid collection. Vascular: No hyperdense vessel Skull: No fracture or focal lesion. Other: None. CT MAXILLOFACIAL FINDINGS Osseous: Motion artifact limitations. Zygomatic arches are intact. Remote nasal bone fracture. Mandibles are intact. Orbits: No orbital fracture. Both orbits and globes are  intact. Sinuses: No sinus fracture or fluid level. Scattered mucosal thickening. Soft tissues: Negative. Left supraorbital laceration described in clinical notes not visualized. CT CERVICAL SPINE FINDINGS Alignment: Reversal of normal lordosis. No traumatic subluxation. Skull base and vertebrae: No acute fracture. Vertebral body heights are maintained. The dens and skull base are intact. Soft tissues and spinal canal: No prevertebral fluid or swelling. No visible canal hematoma. Disc levels: Multilevel degenerative disc disease and facet hypertrophy, unchanged prior. Upper chest: No acute findings. Other: None. IMPRESSION: 1. No acute intracranial abnormality. No skull fracture. 2. No acute facial bone fracture.  Remote nasal bone fracture. 3. No fracture or subluxation of the cervical spine. Electronically Signed   By: Narda RutherfordMelanie  Sanford M.D.   On: 07/04/2019 01:34   Ct Maxillofacial Wo Contrast  Result Date: 07/04/2019 CLINICAL DATA:  Post unwitnessed. Laceration above Maxface trauma blunt; Neck pain, initial exam EXAM: CT HEAD WITHOUT CONTRAST CT MAXILLOFACIAL WITHOUT CONTRAST CT CERVICAL SPINE WITHOUT CONTRAST TECHNIQUE: Multidetector CT imaging of the head, cervical spine, and maxillofacial structures were performed using the standard protocol without intravenous contrast. Multiplanar CT image reconstructions of the cervical spine and maxillofacial structures were also generated. COMPARISON:  Head and cervical spine CT 10/04/2018 FINDINGS: CT HEAD FINDINGS Brain: No intracranial hemorrhage, mass effect, or midline shift. Generalized atrophy, stable but age advanced. No hydrocephalus. The basilar cisterns are patent. No evidence of territorial infarct or acute ischemia. No extra-axial or intracranial fluid collection. Vascular: No hyperdense vessel Skull: No fracture or focal lesion. Other: None. CT MAXILLOFACIAL FINDINGS Osseous: Motion artifact limitations. Zygomatic arches are intact. Remote nasal bone  fracture. Mandibles are intact. Orbits: No orbital fracture. Both orbits and globes are intact. Sinuses: No sinus fracture or fluid level. Scattered mucosal thickening. Soft tissues: Negative. Left supraorbital laceration described in clinical notes not  visualized. CT CERVICAL SPINE FINDINGS Alignment: Reversal of normal lordosis. No traumatic subluxation. Skull base and vertebrae: No acute fracture. Vertebral body heights are maintained. The dens and skull base are intact. Soft tissues and spinal canal: No prevertebral fluid or swelling. No visible canal hematoma. Disc levels: Multilevel degenerative disc disease and facet hypertrophy, unchanged prior. Upper chest: No acute findings. Other: None. IMPRESSION: 1. No acute intracranial abnormality. No skull fracture. 2. No acute facial bone fracture.  Remote nasal bone fracture. 3. No fracture or subluxation of the cervical spine. Electronically Signed   By: Narda RutherfordMelanie  Sanford M.D.   On: 07/04/2019 01:34    Pending Labs Unresulted Labs (From admission, onward)   None      Vitals/Pain Today's Vitals   07/04/19 0415 07/04/19 0515 07/04/19 0530 07/04/19 0600  BP: 114/73 111/78 (!) 115/96 (!) 127/47  Pulse:  83 78 93  Resp: (!) 23 13 16  (!) 24  Temp:      TempSrc:      SpO2:  100% 100% 100%  Weight:      Height:      PainSc:        Isolation Precautions No active isolations  Medications Medications  lidocaine-EPINEPHrine (XYLOCAINE W/EPI) 2 %-1:200000 (PF) injection 10 mL (10 mLs Infiltration Given 07/04/19 0530)    Mobility non-ambulatory High fall risk   Focused Assessments   R Recommendations: See Admitting Provider Note  Report given to:  Nettie [facility]ylvia at [facility]  Additional Notes:

## 2019-07-04 NOTE — Discharge Instructions (Addendum)
Thank you for allowing me to care for you today in the Emergency Department.   Your sutures need to be removed in 5-6 days.   To care for your wound, clean the area at least once daily with warm water and soap.  You can apply a topical antibiotic such as bacitracin or Neosporin to the area.  There is also a scratch on your nose that was not able to be repaired with sutures.  Clean this with warm water and soap as well.  Return to the emergency department if you develop fever, chills, if the area around the wound becomes red, hot to the touch, swollen, or if you start to have thick, mucus-like drainage from the wound.

## 2019-07-04 NOTE — ED Notes (Signed)
PTAR called @ 1554-per Velna Hatchet, RN called by Levada Dy

## 2019-07-04 NOTE — ED Notes (Signed)
RN contacted Pt's mother Enid Derry,

## 2019-07-04 NOTE — Discharge Instructions (Addendum)
You have been seen today for fall. Please read and follow all provided instructions. Return to the emergency room for worsening condition or new concerning symptoms.    1. Medications:  Continue usual home medications Take medications as prescribed. Please review all of the medicines and only take them if you do not have an allergy to them.   2. Treatment: rest, drink plenty of fluids. Apply ice to nose for pain as needed.  3. Follow Up: Please follow up with your primary doctor in 2-5 days for discussion of your diagnoses and further evaluation after today's visit; Call today to arrange your follow up.  Please follow wound care for sutures put in yesterday as instructed in previous discharge paperwork.   ?

## 2019-07-04 NOTE — ED Notes (Signed)
Pt has reopened laceration to nose. RN informed PA that this was a previous wound that has opened and worsen

## 2019-07-04 NOTE — ED Triage Notes (Signed)
Arrived via EMS from [facility] for a fall trying to get to get out of bed. Seen today once earlier for fall had sutures placed on forehead. This fall patient has a laceration on nose. Airway intact bilateral equal chest rise and fall. History of dementia.

## 2019-07-04 NOTE — ED Provider Notes (Signed)
[facility] Provider Note   CSN: [id number] Arrival date & time: 07/04/19  1205    History   Chief Complaint Chief Complaint  Patient presents with   Fall   Facial Injury    HPI [name] is a 53 y.o. male with past medical history of Addison disease, anoxic brain injury, A. fib, dementia, aspiration pneumonia, seizure disorder presents to emergency department today for chief complaint of unwitnessed fall.  [facility].  EMS reports patient fell trying to get out of bed sustaining laceration to nose. Level 5 caveat due to dementia. Pt unable to provide history. Pt is not anticoagulated.  I [facility] with [facility]. She states CNA was helping the patient with ADLs. As she was leaving his room, pt got out of bed, attempted to walk and fell, landing on his face. There was no LOC. Pt already had a laceration to bridge of nose that began bleeding after the fall. Pressure was applied to this nose and bleeding resolved. Pt did not complain of pain, grimace, or withdraw when pressure applied. There was no seizure like activity.    Chart reviews shows pt was here in the ED earlier today for a fall. He sustained 2 cm laceration to his face, left lateral aspect of nose. Laceration was repaired. Pt also underwent imaging including CT head, neck, maxillofacial and xray of right shoulder, lumbar spine, and chest. Shoulder xray with chronic deformity, no acute findings. Also no acute findings on on lumbar spine or chest. CT findings include No acute intracranial abnormality, no acute facial bone fracture, remote nasal bone fracture.    Past Medical History:  Diagnosis Date   Addison disease (HCC)    Anoxic brain injury (HCC)    Anxiety    Aspiration pneumonia (HCC)    Atrial fibrillation (HCC)    Chronic constipation 11/23/2011   Dementia (HCC)    Dysphagia    Encephalopathy    Glaucoma     Glucocorticoid deficiency (HCC)    History of recurrent UTIs    Hyperlipemia    Hypothyroidism    Mental disorder    Myocardial infarction (HCC)    Pneumoperitoneum of unknown etiology 12/21/2011   Quadriplegia (HCC)    Reflux    Seizure disorder [facility](HCC)     Patient Active Problem List   Diagnosis Date Noted   PEG Gastrostomy tube in place (HCC) 01/26/2018   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 01/26/2018 01/26/2018   Bedridden 01/26/2018   Quadriplegia (HCC)    Abdominal wall abscess 01/25/2018   Fracture of humerus anatomical neck, right, closed, initial encounter 10/03/2017   AKI (acute kidney injury) (HCC) 10/03/2017   UTI (urinary tract infection) 10/03/2017   Agitation 07/03/2014   Cardiomyopathy, ischemic 06/18/2014   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   Acute psychosis (HCC) 04/19/2014   Unspecified hypothyroidism 05/16/2013   Constipation 05/16/2013   Anemia of other chronic disease 05/16/2013   Mental disorder 01/03/2013   Addisons disease (HCC) 01/03/2013   PEG (percutaneous endoscopic gastrostomy) adjustment/replacement/removal (HCC) 12/21/2011   Hypokalemia 11/25/2011   Thrombocytopenia (HCC) 11/25/2011   FTT (failure to thrive) in adult 11/25/2011   Grand mal seizure (HCC) 11/23/2011   Depression (emotion) 11/23/2011   History of atrial fibrillation 11/23/2011   Other specified hemorrhagic conditions (HCC) 11/23/2011   Glaucoma 11/23/2011   Hypoglycemia 11/23/2011   Chronic constipation 11/23/2011   Anoxic brain injury (HCC) 11/12/2011  Protein calorie malnutrition (HCC) 11/12/2011   Hyperchloremia 11/12/2011   Hyperthyroidism 11/12/2011   Acute lower UTI 11/12/2011   Dysphagia     Past Surgical History:  Procedure Laterality Date   DEBRIDEMENT OF ABDOMINAL WALL ABSCESS  01/26/2018   HERNIA MESH REMOVAL  01/26/2018   Infected mesh - removed w I&D abd wall abscess   IR GENERIC HISTORICAL   10/20/2016   IR REPLC GASTRO/COLONIC TUBE PERCUT W/FLUORO 10/20/2016 Oley Balmaniel Hassell, MD [facility]   IR REPLACE G-TUBE SIMPLE WO FLUORO  01/28/2018   IR REPLC GASTRO/COLONIC TUBE PERCUT W/FLUORO  04/21/2017   IR REPLC GASTRO/COLONIC TUBE PERCUT W/FLUORO  07/22/2017   IR REPLC GASTRO/COLONIC TUBE PERCUT W/FLUORO  11/24/2017   IRRIGATION AND DEBRIDEMENT ABSCESS N/A 01/26/2018   Procedure: IRRIGATION AND DEBRIDEMENT ABDOMINAL WALL ABSCESS WITH REMOVAL OF INFECTED MESH;  Surgeon: Karie SodaGross, Steven, MD;  Location: [facility] ORS;  Service: General;  Laterality: N/A;   NEPHRECTOMY  unknown   PEG TUBE PLACEMENT          Home Medications    Prior to Admission medications   Medication Sig Start Date End Date Taking? Authorizing Provider  acetaminophen (TYLENOL) 325 MG tablet Place 650 mg into feeding tube every 4 (four) hours as needed for mild pain.   Yes [provider]  Amino Acids-Protein Hydrolys (FEEDING SUPPLEMENT, PRO-STAT SUGAR FREE 64,) LIQD Place 30 mLs into feeding tube 2 (two) times daily.    Yes [provider]  aspirin 81 MG chewable tablet Chew 81 mg by mouth daily.   Yes [provider]  Carboxymeth-Glycerin-Polysorb (REFRESH OPTIVE ADVANCED OP) Place 1 drop into both eyes 3 (three) times daily.   Yes [provider]  divalproex (DEPAKOTE SPRINKLE) 125 MG capsule Take 375-500 mg by mouth See admin instructions. Take 3 capsules (375mg ) via g-tube every morning, then take 4 capsules (500mg ) via g-tube every evening. 07/18/13  Yes [provider]  levothyroxine (SYNTHROID, LEVOTHROID) 150 MCG tablet Place 150 mcg into feeding tube daily before breakfast.    Yes [provider]  metoprolol tartrate (LOPRESSOR) 25 mg/10 mL SUSP Take 12.5 mg by mouth 2 (two) times daily.   Yes [provider]  Multiple Vitamins-Minerals (CERTAVITE/ANTIOXIDANTS) LIQD 15 mLs by PEG Tube route daily at 12 noon.    Yes [provider]    pantoprazole sodium (PROTONIX) 40 mg/20 mL PACK 40 mg by PEG Tube route daily at 12 noon.  11/30/11  Yes Alinda Moneyavis, Novlet J, MD  PARoxetine (PAXIL) 20 MG tablet Place 20 mg into feeding tube daily.  06/03/17  Yes [provider]  polyethylene glycol (MIRALAX / GLYCOLAX) packet 17 g by PEG Tube route 2 (two) times daily. Mix 17gm  In 4-8 ounces of liquid   Yes [provider]  risperiDONE (RISPERDAL M-TABS) 1 MG disintegrating tablet Place 1 mg under the tongue 3 (three) times daily.  05/08/17  Yes [provider]  sennosides (SENOKOT) 8.8 MG/5ML syrup Place 15 mLs into feeding tube 2 (two) times daily.    Yes [provider]  traZODone (DESYREL) 150 MG tablet Take 75 mg by mouth at bedtime.  08/02/13  Yes [provider]    Family History No family history on file.  Social History Social History   Tobacco Use   Smoking status: Never Smoker   Smokeless tobacco: Never Used  Substance Use Topics   Alcohol use: No   Drug use: No     Allergies   Ativan [lorazepam],  Latex, Morphine and related, Penicillins, Pyridium [phenazopyridine hcl], and Soy allergy   Review of Systems Review of Systems  Unable to perform ROS: Dementia     Physical Exam Updated Vital Signs BP 102/67    Pulse 84    Temp 98.8 F (37.1 C) (Oral)    Resp (!) 24    Ht  (1.753 m)    Wt 90 kg    SpO2 98%    BMI 29.30 kg/m   Physical Exam Vitals signs and nursing note reviewed.  Constitutional:      General: He is not in acute distress.    Comments: Chronically ill appearing  HENT:     Head: Normocephalic.     Jaw: There is normal jaw occlusion. No pain on movement.     Comments: 2 cm laceration above left eye repaired with sutures.  Clean dry and intact, no drainage or surrounding edema, no bleeding. No tenderness to palpation of skull. No deformities or crepitus noted. No open wounds, abrasions or lacerations to scalp.     Right Ear: Tympanic membrane and  external ear normal. No hemotympanum.     Left Ear: Tympanic membrane and external ear normal. No hemotympanum.     Nose: No nasal tenderness.     Right Nostril: No epistaxis or septal hematoma.     Left Nostril: No epistaxis or septal hematoma.      Comments: Superficial laceration on bridge of the nose, bleeding is controlled.  Wound appears to be old.    Mouth/Throat:     Mouth: Mucous membranes are moist. No injury or lacerations.     Dentition: No dental tenderness.     Pharynx: Oropharynx is clear.  Eyes:     General: No scleral icterus.       Right eye: No discharge.        Left eye: No discharge.     Extraocular Movements: Extraocular movements intact.     Conjunctiva/sclera: Conjunctivae normal.     Pupils: Pupils are equal, round, and reactive to light.  Neck:     Musculoskeletal: Normal range of motion.     Vascular: No JVD.     Comments: Full ROM intact without spinous process TTP. No bony stepoffs or deformities, no paraspinous muscle TTP or muscle spasms. No rigidity or meningeal signs. No bruising, erythema, or swelling.  Cardiovascular:     Rate and Rhythm: Normal rate and regular rhythm.     Pulses: Normal pulses.          Radial pulses are 2+ on the right side and 2+ on the left side.     Heart sounds: Normal heart sounds.  Pulmonary:     Comments: Lungs clear to auscultation in all fields. Symmetric chest rise. No wheezing, rales, or rhonchi. Abdominal:     General: There is no distension.     Palpations: Abdomen is soft.     Comments: G-tube in place with clean dry and intact dressing.  No tenderness to palpation of the abdomen.  No rebound or guarding.  No peritoneal signs.  There is also a packed lower wound on the lower abdomen, dressing is clean dry and intact.  Musculoskeletal: Normal range of motion.  Skin:    General: Skin is warm.     Capillary Refill: Capillary refill takes less than 2 seconds.     Comments: Multiple bruises seen on body, with  varying stages of healing.   Neurological:     Mental Status:  He is oriented to person, place, and time.     GCS: GCS eye subscore is 4. GCS verbal subscore is 5. GCS motor subscore is 6.     Comments: Patient is alert to self only.  He is unable to tell year, place, city.  Moving all 4 extremities.  Follow simple commands.  Psychiatric:        Behavior: Behavior normal.      ED Treatments / Results  Labs (all labs ordered are listed, but only abnormal results are displayed) Labs Reviewed - No data to display  EKG None  Radiology Dg Chest 2 View  Result Date: 07/04/2019 CLINICAL DATA:  Unwitnessed fall. EXAM: CHEST - 2 VIEW COMPARISON:  Chest x-ray dated October 04, 2018. Chest x-ray dated 10/03/2017. FINDINGS: There is persistent elevation of the right hemidiaphragm. The heart size is stable across prior studies. There is no pneumothorax. There is no displaced fracture. There is no large pleural effusion. IMPRESSION: No active cardiopulmonary disease. Electronically Signed   By: Constance Holster M.D.   On: 07/04/2019 01:36   Dg Thoracic Spine 2 View  Result Date: 07/04/2019 CLINICAL DATA:  Pain status post fall EXAM: THORACIC SPINE 2 VIEWS COMPARISON:  None. FINDINGS: There is no evidence of thoracic spine fracture. There may be some mild height loss of several lower thoracic vertebral bodies, likely chronic. Alignment is normal. No other significant bone abnormalities are identified. IMPRESSION: No acute displaced fracture. Electronically Signed   By: Constance Holster M.D.   On: 07/04/2019 01:39   Dg Lumbar Spine Complete  Result Date: 07/04/2019 CLINICAL DATA:  Fall. EXAM: LUMBAR SPINE - COMPLETE 4+ VIEW COMPARISON:  May 24, 2018 FINDINGS: There is no displaced fracture. The alignment appears unremarkable. There are multilevel degenerative changes throughout the lumbar spine, greatest at the lower lumbar segments. There is multilevel facet arthrosis. There is disc height loss at the  L5-S1 level. IMPRESSION: No acute osseous abnormality. Electronically Signed   By: Constance Holster M.D.   On: 07/04/2019 01:40   Dg Shoulder Right  Result Date: 07/04/2019 CLINICAL DATA:  Pain status post fall EXAM: RIGHT SHOULDER - 2+ VIEW COMPARISON:  Chest x-ray dated 10/04/2018 FINDINGS: There is a chronic deformity of the proximal right humerus, similar to prior studies. There is no evidence of a displaced fracture. No definite dislocation. IMPRESSION: Chronic deformity of the proximal right humerus without evidence of an acute displaced fracture or dislocation. Electronically Signed   By: Constance Holster M.D.   On: 07/04/2019 01:37   Ct Head Wo Contrast  Result Date: 07/04/2019 CLINICAL DATA:  Post unwitnessed. Laceration above Maxface trauma blunt; Neck pain, initial exam EXAM: CT HEAD WITHOUT CONTRAST CT MAXILLOFACIAL WITHOUT CONTRAST CT CERVICAL SPINE WITHOUT CONTRAST TECHNIQUE: Multidetector CT imaging of the head, cervical spine, and maxillofacial structures were performed using the standard protocol without intravenous contrast. Multiplanar CT image reconstructions of the cervical spine and maxillofacial structures were also generated. COMPARISON:  Head and cervical spine CT 10/04/2018 FINDINGS: CT HEAD FINDINGS Brain: No intracranial hemorrhage, mass effect, or midline shift. Generalized atrophy, stable but age advanced. No hydrocephalus. The basilar cisterns are patent. No evidence of territorial infarct or acute ischemia. No extra-axial or intracranial fluid collection. Vascular: No hyperdense vessel Skull: No fracture or focal lesion. Other: None. CT MAXILLOFACIAL FINDINGS Osseous: Motion artifact limitations. Zygomatic arches are intact. Remote nasal bone fracture. Mandibles are intact. Orbits: No orbital fracture. Both orbits and globes are intact. Sinuses: No sinus fracture or fluid  level. Scattered mucosal thickening. Soft tissues: Negative. Left supraorbital laceration described in  clinical notes not visualized. CT CERVICAL SPINE FINDINGS Alignment: Reversal of normal lordosis. No traumatic subluxation. Skull base and vertebrae: No acute fracture. Vertebral body heights are maintained. The dens and skull base are intact. Soft tissues and spinal canal: No prevertebral fluid or swelling. No visible canal hematoma. Disc levels: Multilevel degenerative disc disease and facet hypertrophy, unchanged prior. Upper chest: No acute findings. Other: None. IMPRESSION: 1. No acute intracranial abnormality. No skull fracture. 2. No acute facial bone fracture.  Remote nasal bone fracture. 3. No fracture or subluxation of the cervical spine. Electronically Signed   By: Narda Rutherford M.D.   On: 07/04/2019 01:34   Ct Cervical Spine Wo Contrast  Result Date: 07/04/2019 CLINICAL DATA:  Post unwitnessed. Laceration above Maxface trauma blunt; Neck pain, initial exam EXAM: CT HEAD WITHOUT CONTRAST CT MAXILLOFACIAL WITHOUT CONTRAST CT CERVICAL SPINE WITHOUT CONTRAST TECHNIQUE: Multidetector CT imaging of the head, cervical spine, and maxillofacial structures were performed using the standard protocol without intravenous contrast. Multiplanar CT image reconstructions of the cervical spine and maxillofacial structures were also generated. COMPARISON:  Head and cervical spine CT 10/04/2018 FINDINGS: CT HEAD FINDINGS Brain: No intracranial hemorrhage, mass effect, or midline shift. Generalized atrophy, stable but age advanced. No hydrocephalus. The basilar cisterns are patent. No evidence of territorial infarct or acute ischemia. No extra-axial or intracranial fluid collection. Vascular: No hyperdense vessel Skull: No fracture or focal lesion. Other: None. CT MAXILLOFACIAL FINDINGS Osseous: Motion artifact limitations. Zygomatic arches are intact. Remote nasal bone fracture. Mandibles are intact. Orbits: No orbital fracture. Both orbits and globes are intact. Sinuses: No sinus fracture or fluid level. Scattered  mucosal thickening. Soft tissues: Negative. Left supraorbital laceration described in clinical notes not visualized. CT CERVICAL SPINE FINDINGS Alignment: Reversal of normal lordosis. No traumatic subluxation. Skull base and vertebrae: No acute fracture. Vertebral body heights are maintained. The dens and skull base are intact. Soft tissues and spinal canal: No prevertebral fluid or swelling. No visible canal hematoma. Disc levels: Multilevel degenerative disc disease and facet hypertrophy, unchanged prior. Upper chest: No acute findings. Other: None. IMPRESSION: 1. No acute intracranial abnormality. No skull fracture. 2. No acute facial bone fracture.  Remote nasal bone fracture. 3. No fracture or subluxation of the cervical spine. Electronically Signed   By: Narda Rutherford M.D.   On: 07/04/2019 01:34   Ct Maxillofacial Wo Contrast  Result Date: 07/04/2019 CLINICAL DATA:  Post unwitnessed. Laceration above Maxface trauma blunt; Neck pain, initial exam EXAM: CT HEAD WITHOUT CONTRAST CT MAXILLOFACIAL WITHOUT CONTRAST CT CERVICAL SPINE WITHOUT CONTRAST TECHNIQUE: Multidetector CT imaging of the head, cervical spine, and maxillofacial structures were performed using the standard protocol without intravenous contrast. Multiplanar CT image reconstructions of the cervical spine and maxillofacial structures were also generated. COMPARISON:  Head and cervical spine CT 10/04/2018 FINDINGS: CT HEAD FINDINGS Brain: No intracranial hemorrhage, mass effect, or midline shift. Generalized atrophy, stable but age advanced. No hydrocephalus. The basilar cisterns are patent. No evidence of territorial infarct or acute ischemia. No extra-axial or intracranial fluid collection. Vascular: No hyperdense vessel Skull: No fracture or focal lesion. Other: None. CT MAXILLOFACIAL FINDINGS Osseous: Motion artifact limitations. Zygomatic arches are intact. Remote nasal bone fracture. Mandibles are intact. Orbits: No orbital fracture. Both  orbits and globes are intact. Sinuses: No sinus fracture or fluid level. Scattered mucosal thickening. Soft tissues: Negative. Left supraorbital laceration described in clinical notes not visualized. CT CERVICAL  SPINE FINDINGS Alignment: Reversal of normal lordosis. No traumatic subluxation. Skull base and vertebrae: No acute fracture. Vertebral body heights are maintained. The dens and skull base are intact. Soft tissues and spinal canal: No prevertebral fluid or swelling. No visible canal hematoma. Disc levels: Multilevel degenerative disc disease and facet hypertrophy, unchanged prior. Upper chest: No acute findings. Other: None. IMPRESSION: 1. No acute intracranial abnormality. No skull fracture. 2. No acute facial bone fracture.  Remote nasal bone fracture. 3. No fracture or subluxation of the cervical spine. Electronically Signed   By: Narda RutherfordMelanie  Sanford M.D.   On: 07/04/2019 01:34    Procedures Procedures (including critical care time)  Medications Ordered in ED Medications  bacitracin ointment ( Topical Given 07/04/19 1556)     Initial Impression / Assessment and Plan / ED Course  I have reviewed the triage vital signs and the nursing notes.  Pertinent labs & imaging results that were available during my care of the patient were reviewed by me and considered in my medical decision making (see chart for details).  53 year old male presents with chief complaint of fall, [facility]. Unfortunately this is his second visit in lat 24 hours for fall. I viewed labs and imaging from previous visit. RN had this patient during his first visit also, she reports laceration to nose is unchanged. Wound care provided and do not feel laceration is amendable to repair. Bleeding is controlled. Pt appears to be at his baseline [facility]. Pt had no LOC after this fall, plan to observe for neuro changes. Do not feel repeat imaging is necessary at this time,  will consider if mental status changes.  Serial neuro exams without changes, no seizure like activity, no syncope during several hours of observation in ED. Pt is stable and safe [facility]. Strict ED return precautions discussed as well as wound care. The patient was discussed with and seen by Dr. Donnald GarrePfeiffer who agrees with the treatment plan.   This note was prepared using Dragon voice recognition software and may include unintentional dictation errors due to the inherent limitations of voice recognition software.    Final Clinical Impressions(s) / ED Diagnoses   Final diagnoses:  Fall, initial encounter    ED Discharge Orders    None       Kathyrn Lasslbrizze, Elody Kleinsasser E, PA-C 07/05/19 1112    Arby BarrettePfeiffer, Marcy, MD 07/09/19 0745

## 2019-07-04 NOTE — ED Notes (Signed)
Attempted to give report to [facility]. No answer from RN.

## 2019-07-04 NOTE — ED Notes (Signed)
Patient verbalizes understanding of discharge instructions. Opportunity for questioning and answers were provided. Armband removed by staff, pt discharged from ED by PT[facility] back to [facility]

## 2019-07-04 NOTE — ED Notes (Signed)
ptar here to pick patient up

## 2019-08-06 IMAGING — DX CHEST - 2 VIEW
2 series · 2 of 2 positions shown · non-contrast
Comparison: Chest x-ray dated October 04, 2018. Chest x-ray dated
10/03/2017.

CLINICAL DATA: Unwitnessed fall.

EXAM:
CHEST - 2 VIEW

[chest lat]
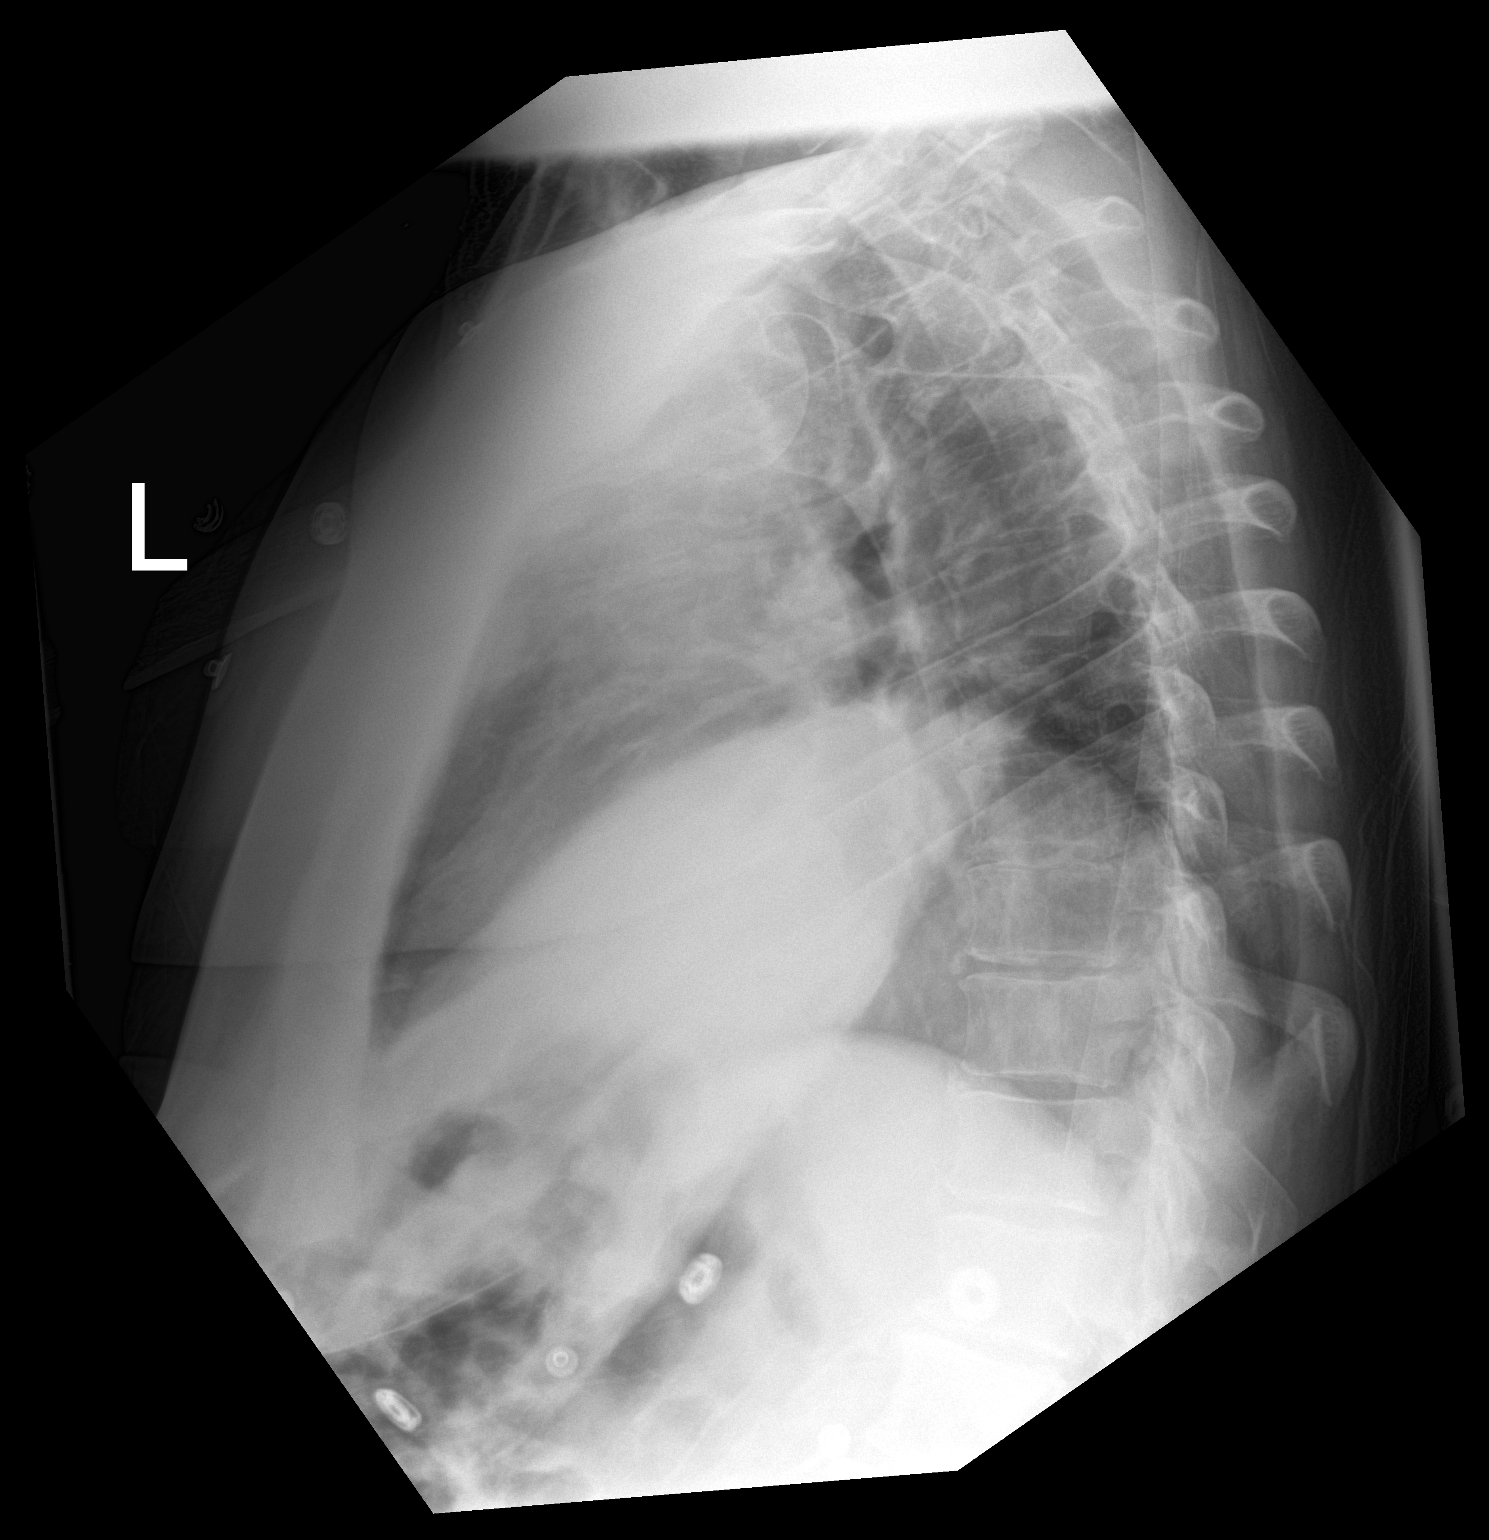

[chest ap]
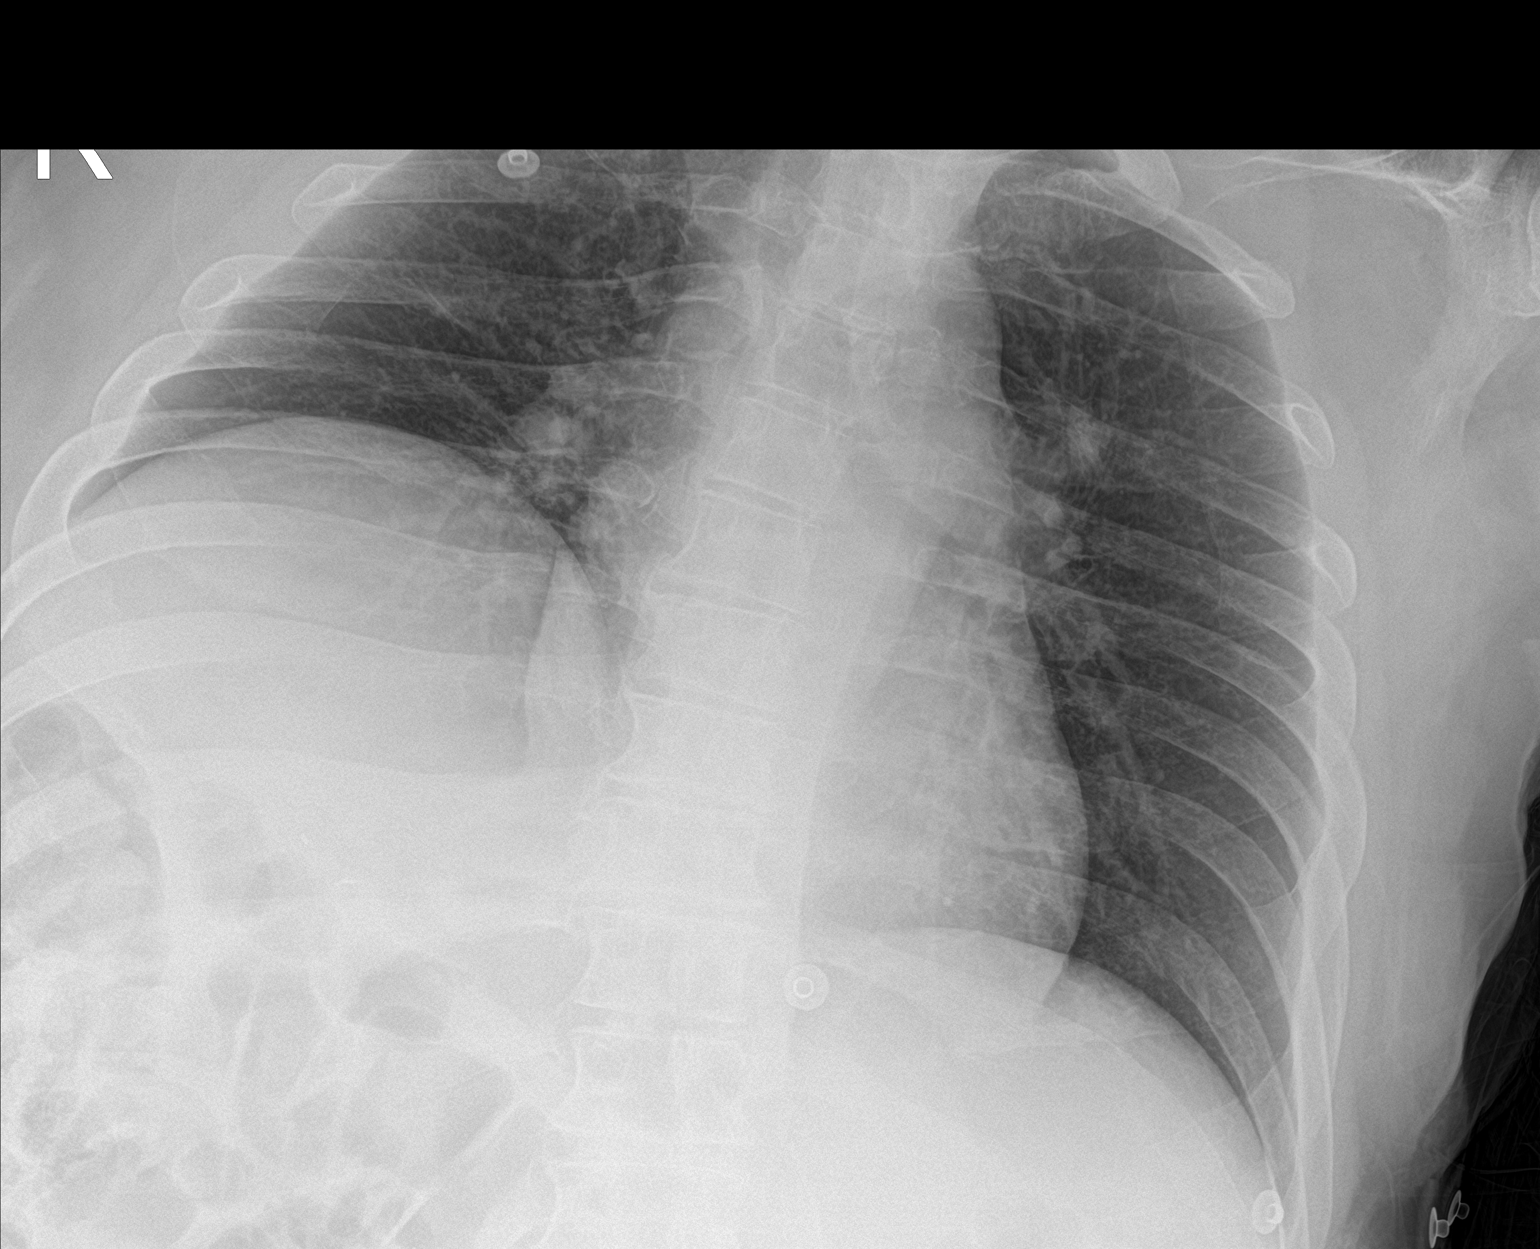

[2 of 2 positions shown; findings below may reference images not displayed]

FINDINGS: There is persistent elevation of the right hemidiaphragm. The heart
size is stable across prior studies. There is no pneumothorax. There
is no displaced fracture. There is no large pleural effusion.
IMPRESSION: No active cardiopulmonary disease.

## 2019-08-06 IMAGING — CT CT CERVICAL SPINE WITHOUT CONTRAST
2 of 14 series · 5 of 33 positions shown, 6 images · non-contrast
Comparison: Head and cervical spine CT 10/04/2018

CLINICAL DATA: Post unwitnessed. Laceration above Maxface trauma
blunt; Neck pain, initial exam

EXAM:
CT HEAD WITHOUT CONTRAST
CT MAXILLOFACIAL WITHOUT CONTRAST
CT CERVICAL SPINE WITHOUT CONTRAST
TECHNIQUE: Multidetector CT imaging of the head, cervical spine, and
maxillofacial structures were performed using the standard protocol
without intravenous contrast. Multiplanar CT image reconstructions
of the cervical spine and maxillofacial structures were also
generated.

[Series 9: maxilllofacial 2.0 hr59 3 · axial · 0.35mm/px · z∈[-293,-101]mm · 3 of 97 slices shown, 4 images]
[im 1/97  soft-tissue]
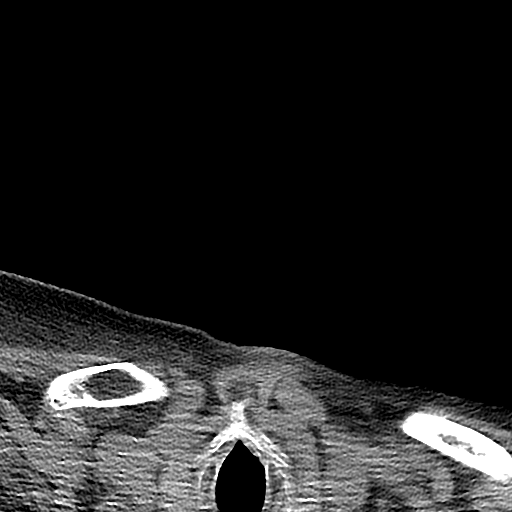
[im 1/97  bone]
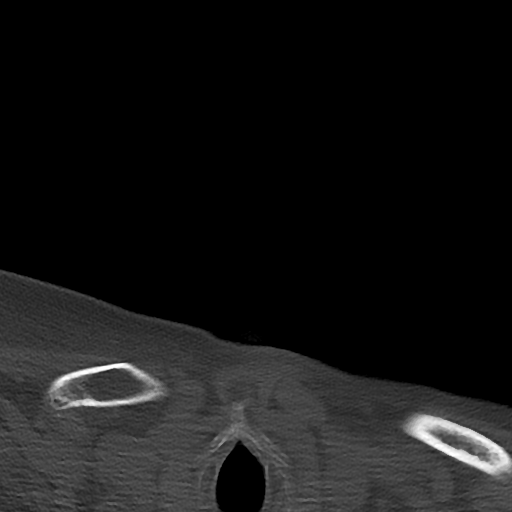
[im 49/97  bone]
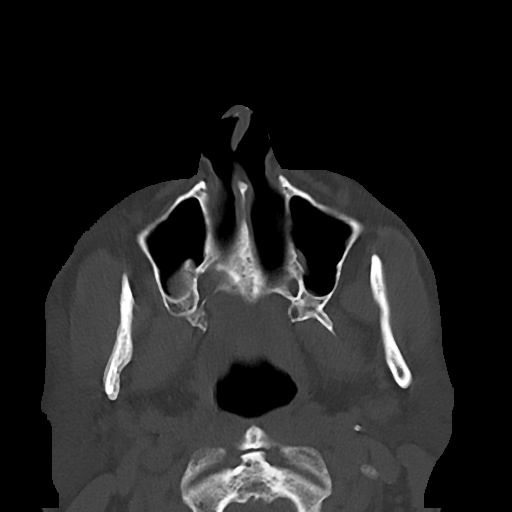
[im 97/97  bone]
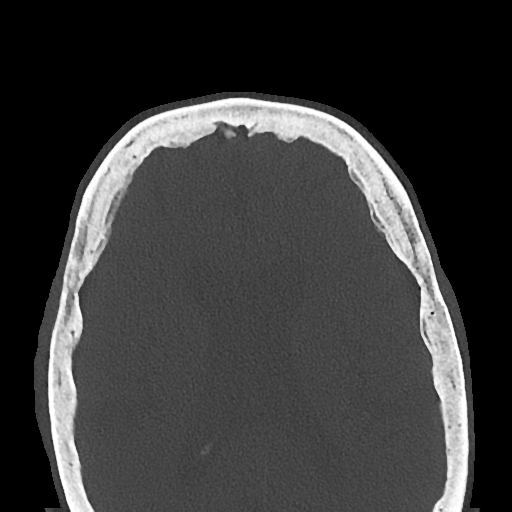

[Series 12: st sag · sagittal · 0.34mm/px · 2 of 88 slices shown]
[im 30/88  bone]
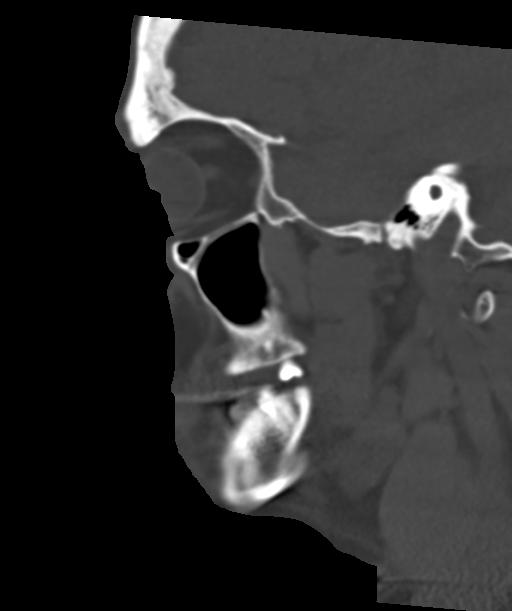
[im 59/88  bone]
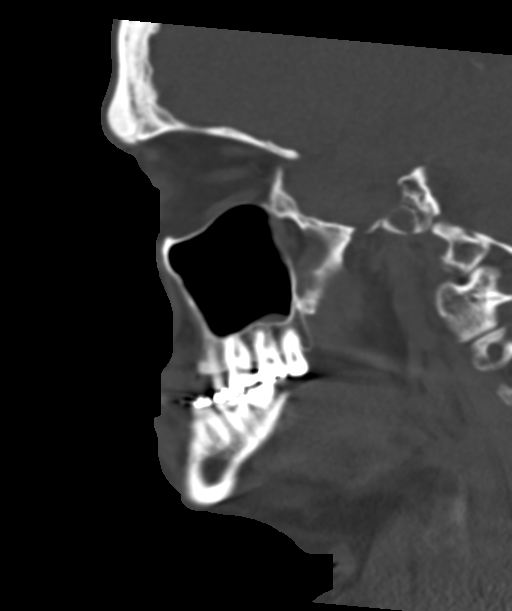

[5 of 33 positions shown; findings below may reference images not displayed]

FINDINGS: CT HEAD FINDINGS

Brain: No intracranial hemorrhage, mass effect, or midline shift.
Generalized atrophy, stable but age advanced. No hydrocephalus. The
basilar cisterns are patent. No evidence of territorial infarct or
acute ischemia. No extra-axial or intracranial fluid collection.

Vascular: No hyperdense vessel

Skull: No fracture or focal lesion.

Other: None.

CT MAXILLOFACIAL FINDINGS

Osseous: Motion artifact limitations. Zygomatic arches are intact.
Remote nasal bone fracture. Mandibles are intact.

Orbits: No orbital fracture. Both orbits and globes are intact.

Sinuses: No sinus fracture or fluid level. Scattered mucosal
thickening.

Soft tissues: Negative. Left supraorbital laceration described in
clinical notes not visualized.

CT CERVICAL SPINE FINDINGS

Alignment: Reversal of normal lordosis. No traumatic subluxation.

Skull base and vertebrae: No acute fracture. Vertebral body heights
are maintained. The dens and skull base are intact.

Soft tissues and spinal canal: No prevertebral fluid or swelling. No
visible canal hematoma.

Disc levels: Multilevel degenerative disc disease and facet
hypertrophy, unchanged prior.

Upper chest: No acute findings.

Other: None.
IMPRESSION: 1. No acute intracranial abnormality. No skull fracture.
2. No acute facial bone fracture.  Remote nasal bone fracture.
3. No fracture or subluxation of the cervical spine.

## 2019-08-06 IMAGING — DX RIGHT SHOULDER - 2+ VIEW
3 series · 3 of 3 positions shown · non-contrast
Comparison: Chest x-ray dated 10/04/2018

CLINICAL DATA: Pain status post fall

EXAM:
RIGHT SHOULDER - 2+ VIEW

[shoulder grashey (1 of 2)]
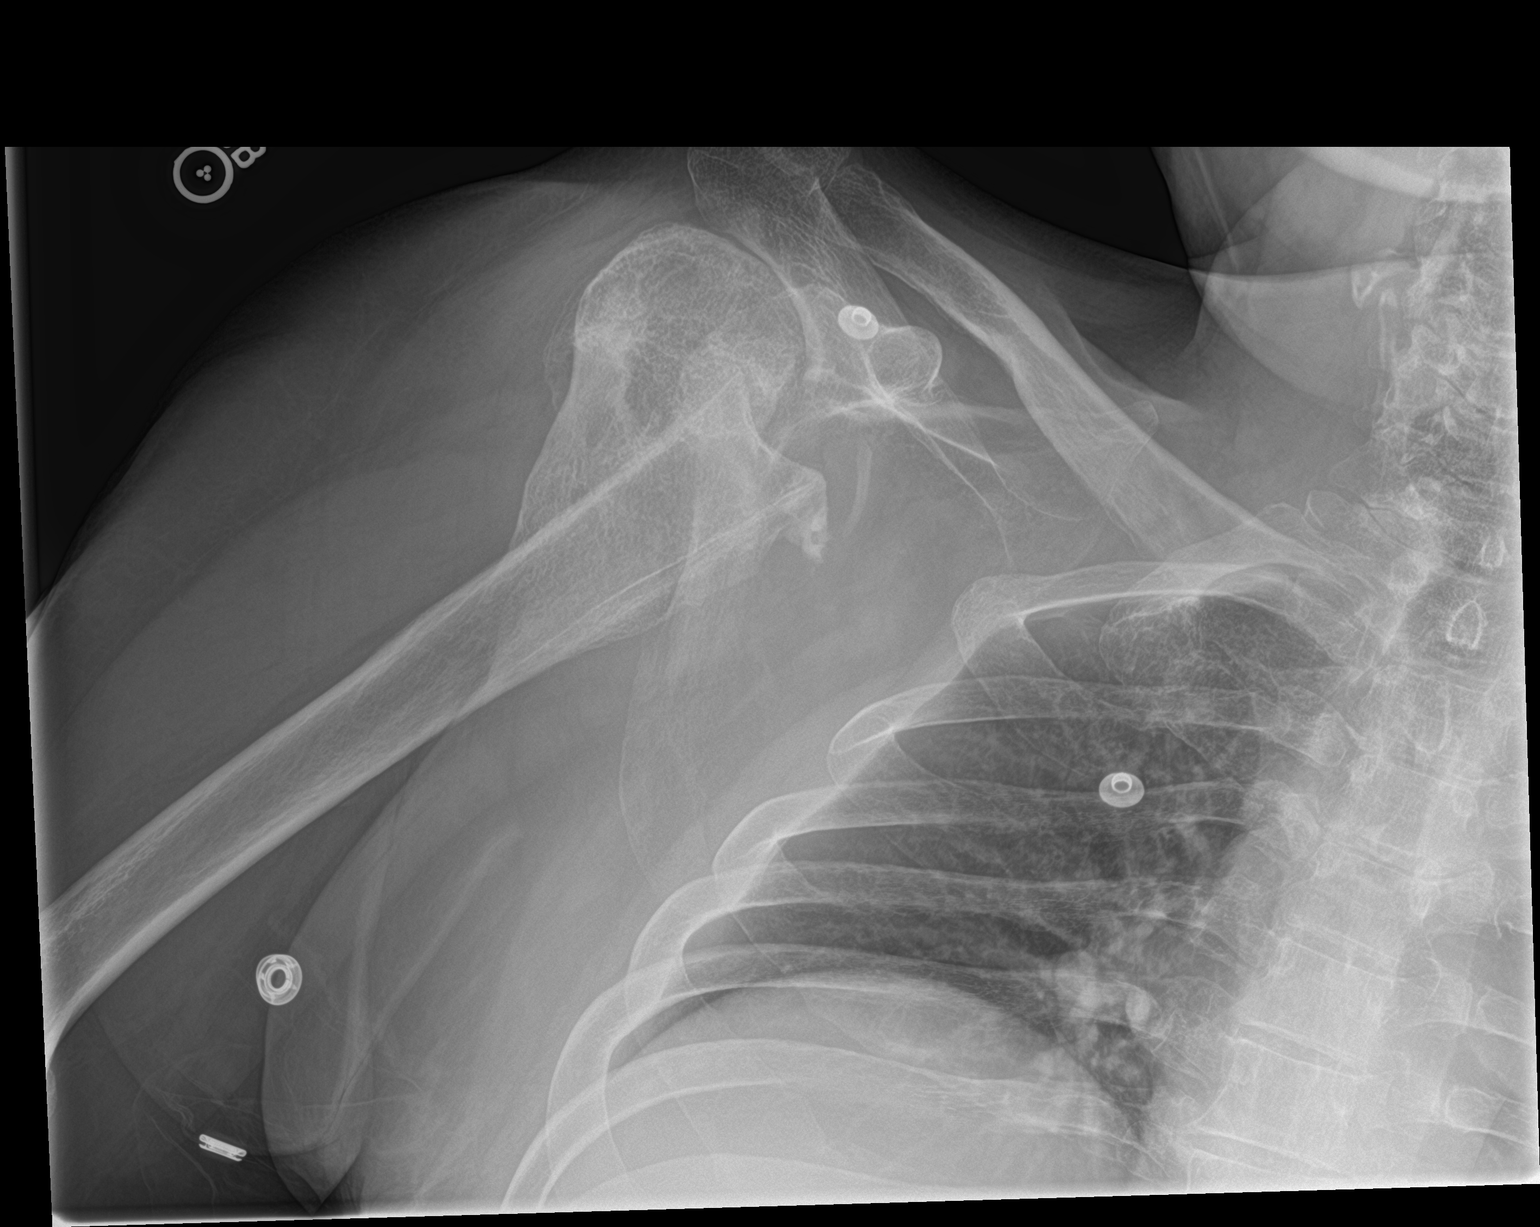

[shoulder y view]
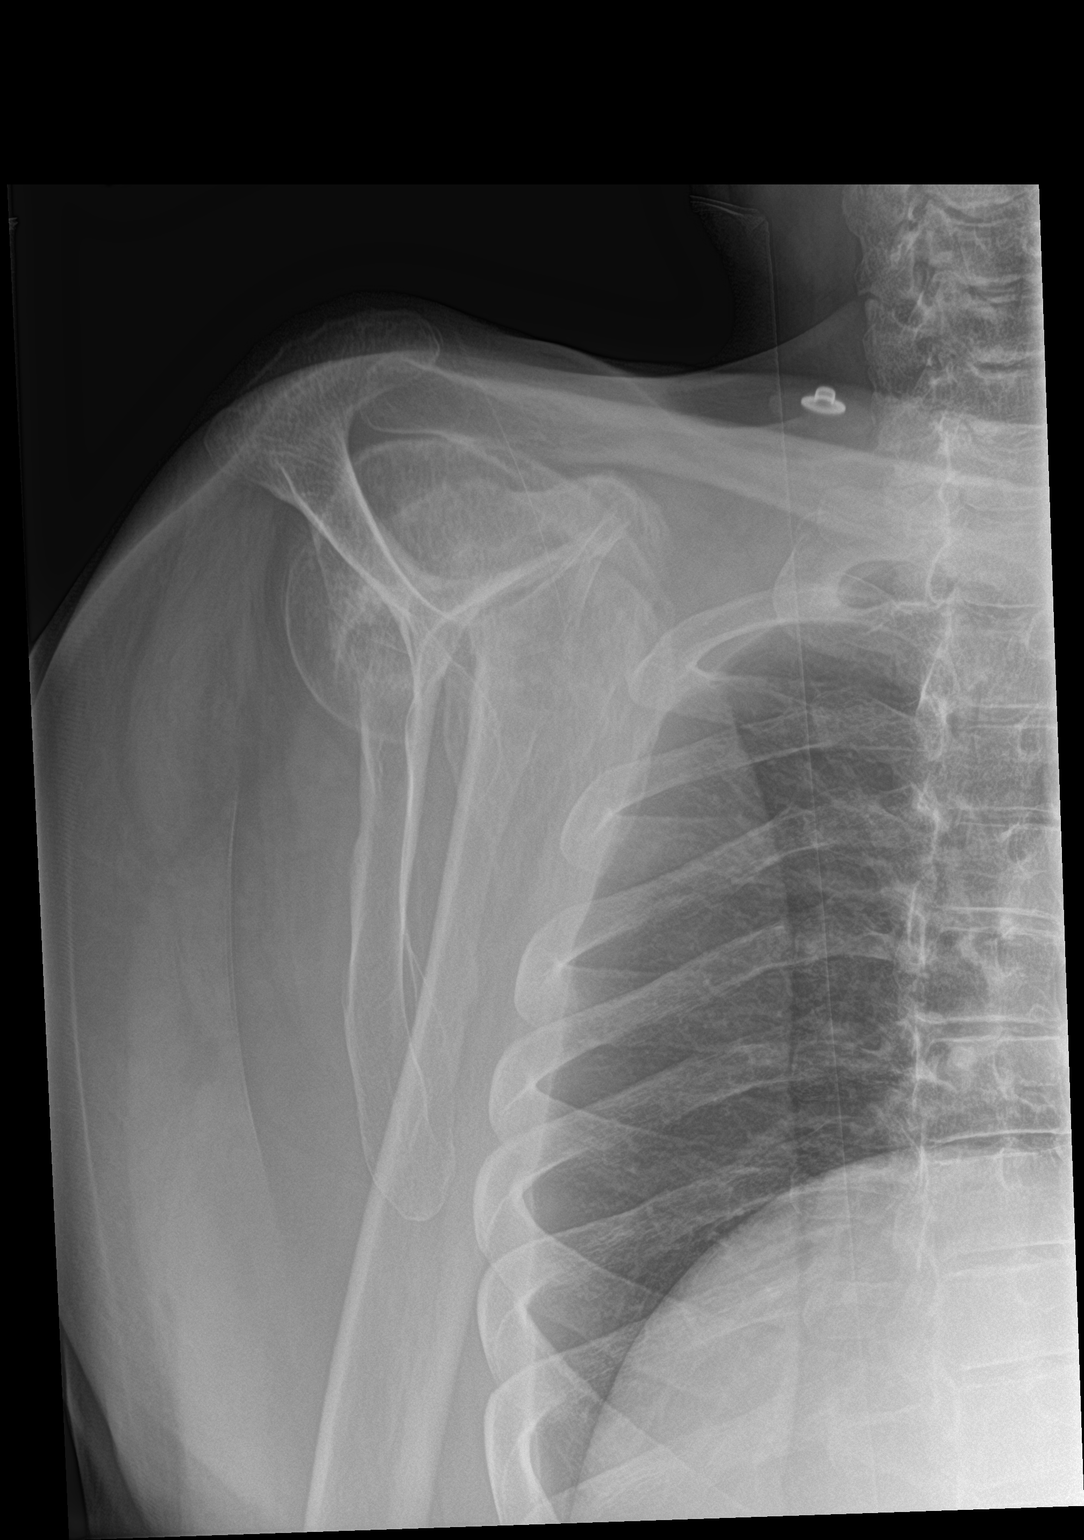

[shoulder grashey (2 of 2)]
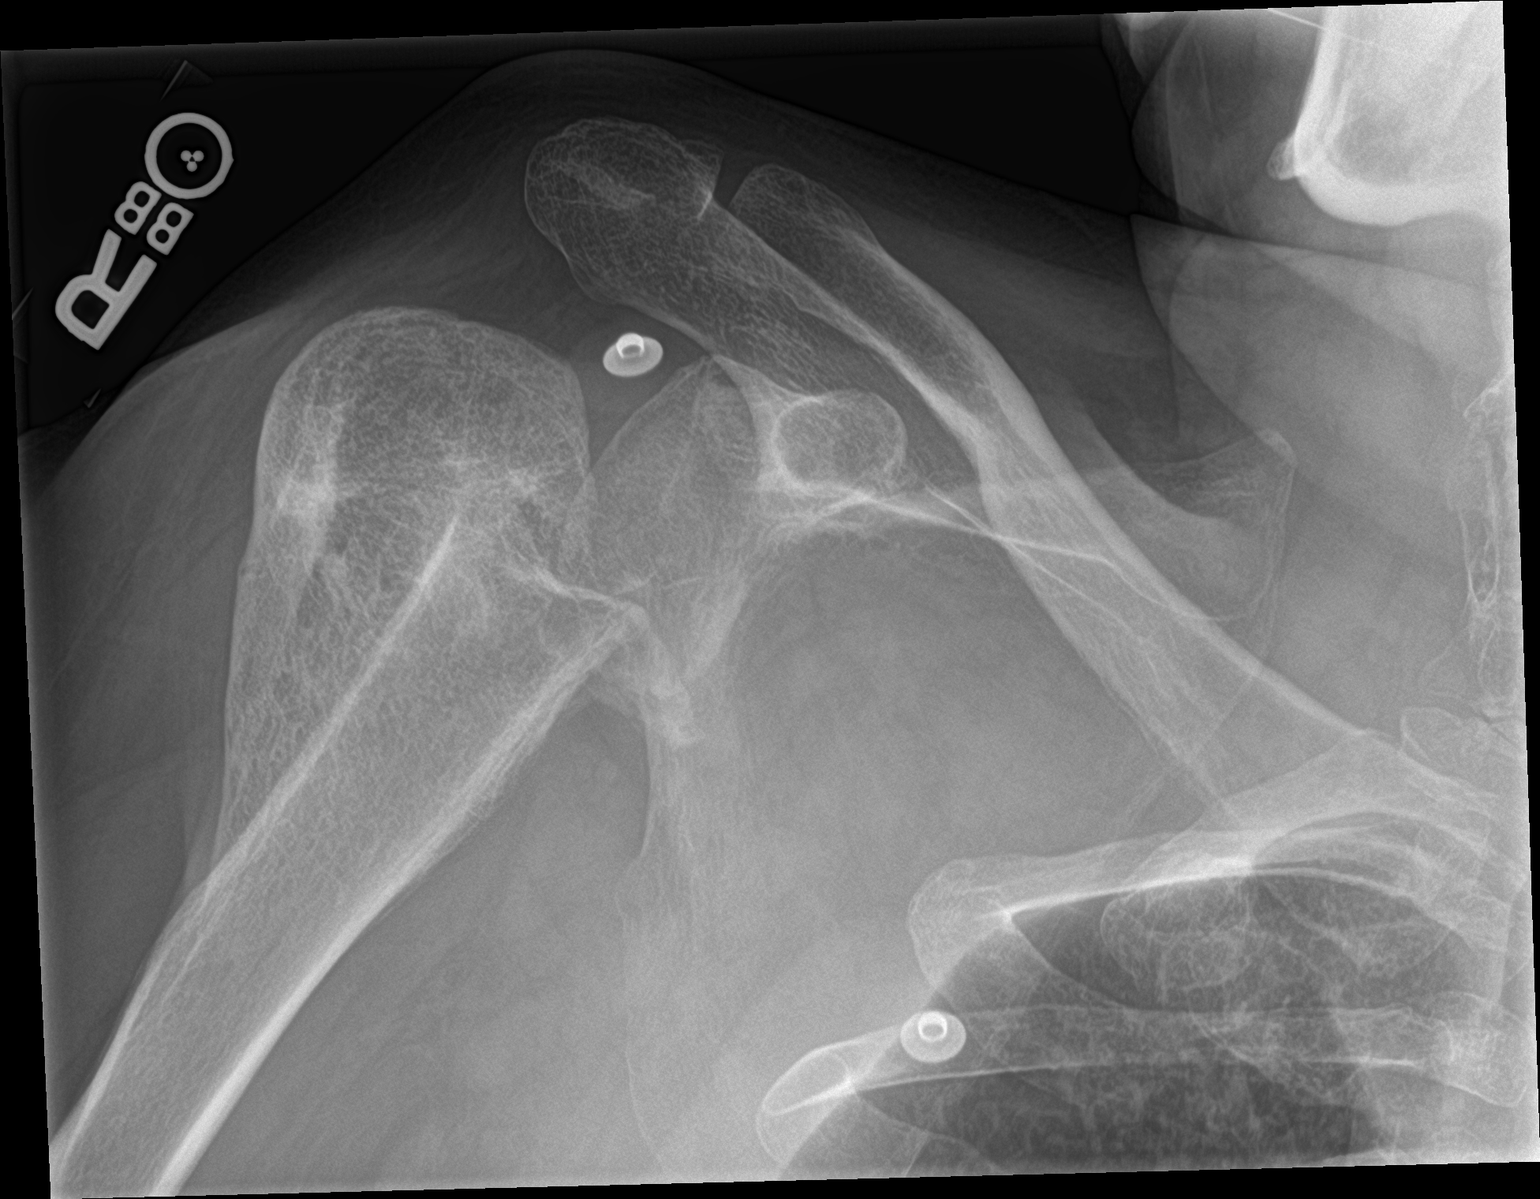

[3 of 3 positions shown; findings below may reference images not displayed]

FINDINGS: There is a chronic deformity of the proximal right humerus, similar
to prior studies. There is no evidence of a displaced fracture. No
definite dislocation.
IMPRESSION: Chronic deformity of the proximal right humerus without evidence of
an acute displaced fracture or dislocation.

## 2019-08-06 IMAGING — DX LUMBAR SPINE - COMPLETE 4+ VIEW
5 series · 5 of 5 positions shown · non-contrast
Comparison: May 24, 2018

CLINICAL DATA: Fall.

EXAM:
LUMBAR SPINE - COMPLETE 4+ VIEW

[l-spine ap]
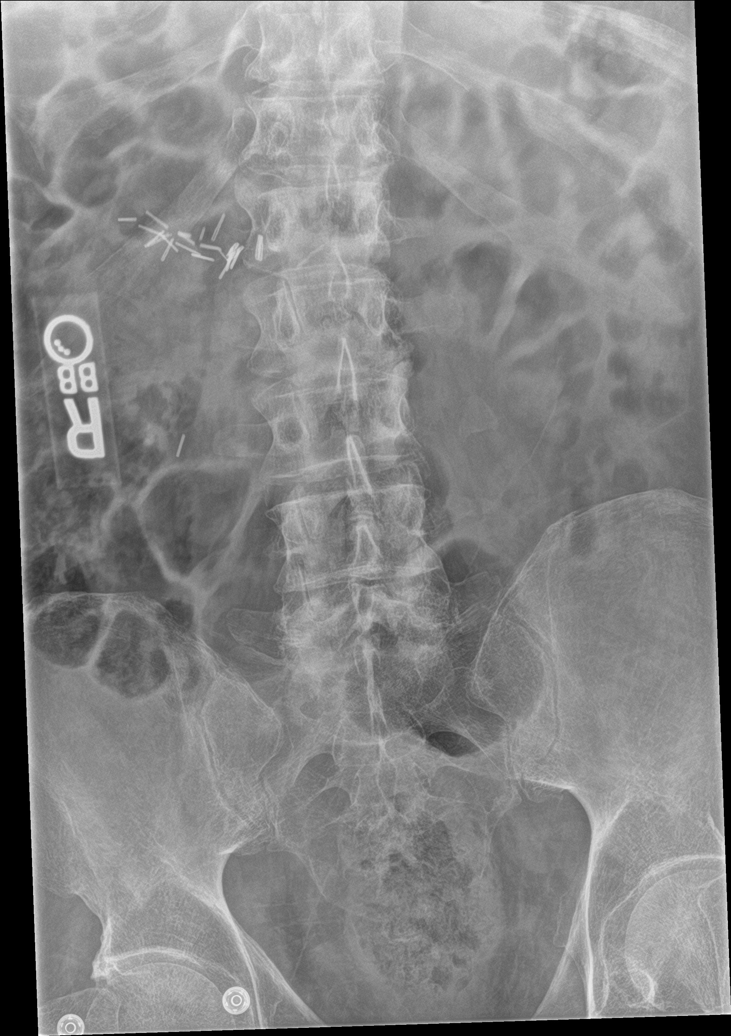

[l-spine obl (1 of 2)]
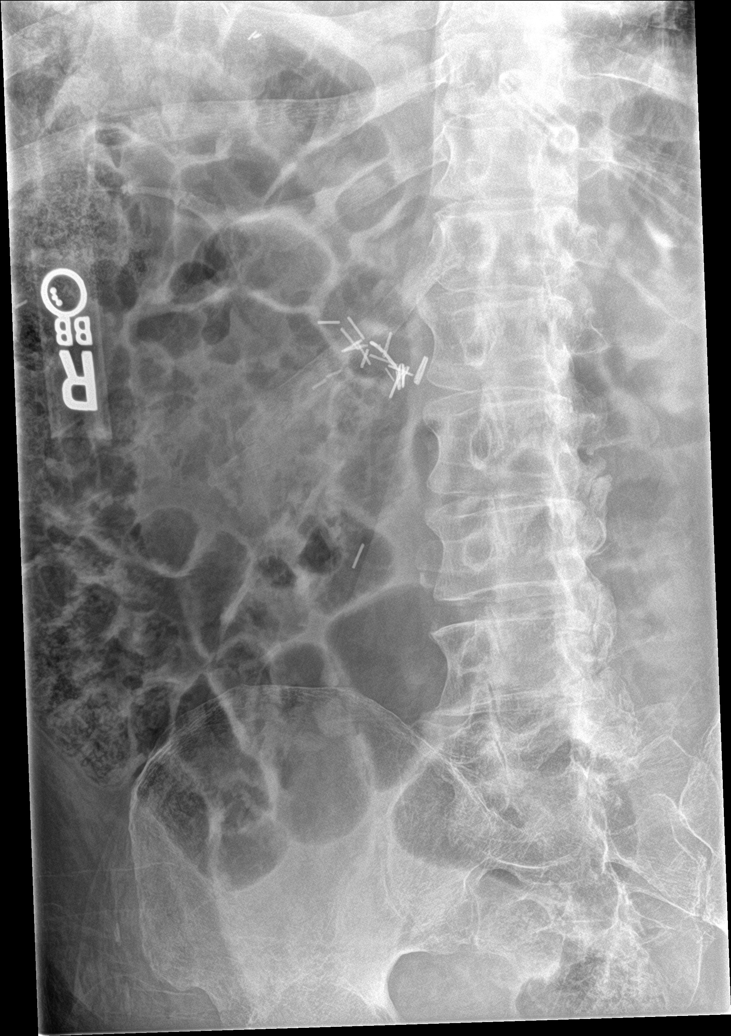

[l-spine obl (2 of 2)]
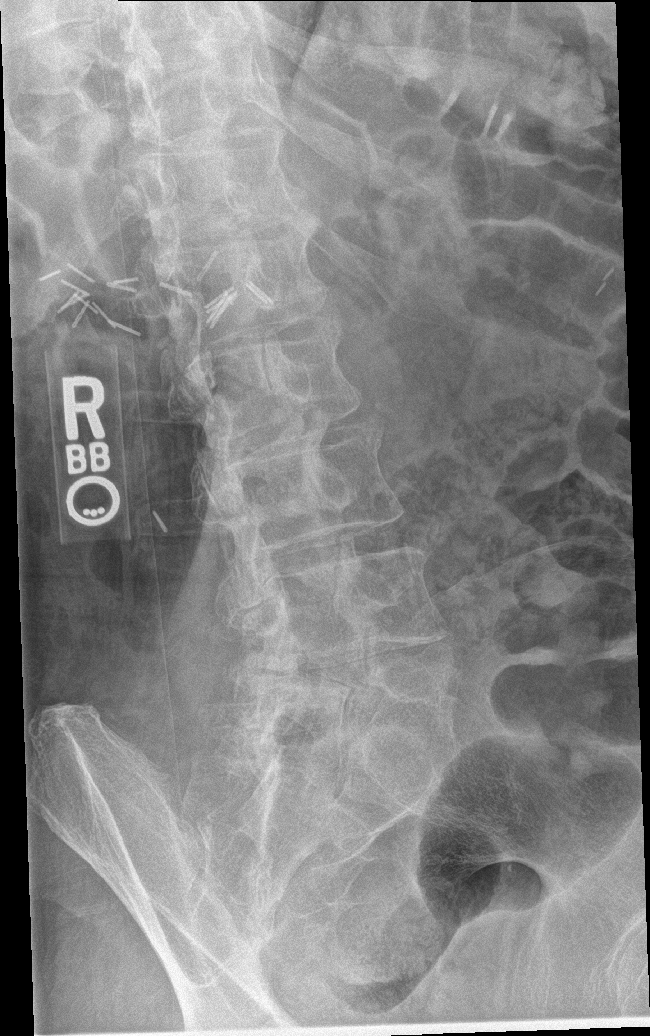

[l-spine lat]
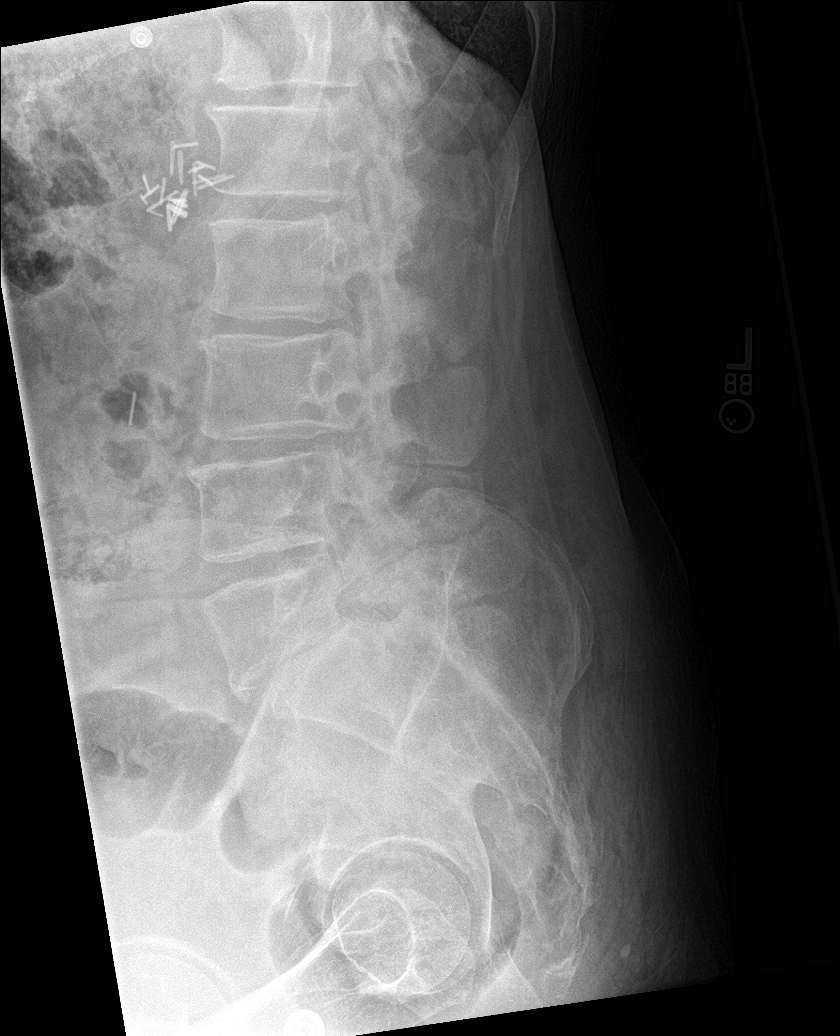

[l-spine spot]
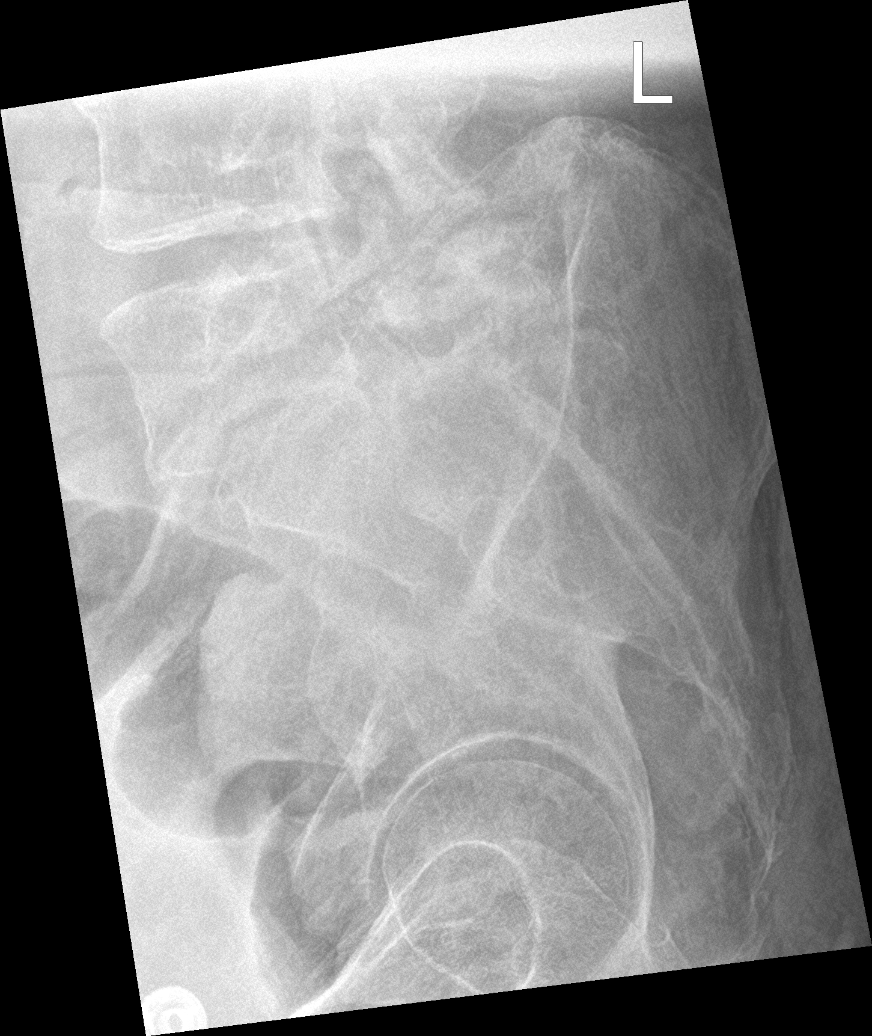

[5 of 5 positions shown; findings below may reference images not displayed]

FINDINGS: There is no displaced fracture. The alignment appears unremarkable.
There are multilevel degenerative changes throughout the lumbar
spine, greatest at the lower lumbar segments. There is multilevel
facet arthrosis. There is disc height loss at the L5-S1 level.
IMPRESSION: No acute osseous abnormality.

## 2019-08-24 ENCOUNTER — Emergency Department (HOSPITAL_COMMUNITY)
Admission: EM | Admit: 2019-08-24 | Discharge: 2019-08-25 | Disposition: A | Discharge status: Home / Self Care | Payer: Medicare Other | Attending: Emergency Medicine | Admitting: Emergency Medicine

## 2019-08-24 DIAGNOSIS — F0390 Unspecified dementia without behavioral disturbance: Secondary | ICD-10-CM | POA: Diagnosis not present

## 2019-08-24 DIAGNOSIS — G931 Anoxic brain damage, not elsewhere classified: Secondary | ICD-10-CM | POA: Insufficient documentation

## 2019-08-24 DIAGNOSIS — Y999 Unspecified external cause status: Secondary | ICD-10-CM | POA: Insufficient documentation

## 2019-08-24 DIAGNOSIS — Z79899 Other long term (current) drug therapy: Secondary | ICD-10-CM | POA: Insufficient documentation

## 2019-08-24 DIAGNOSIS — I4891 Unspecified atrial fibrillation: Secondary | ICD-10-CM | POA: Diagnosis not present

## 2019-08-24 DIAGNOSIS — E039 Hypothyroidism, unspecified: Secondary | ICD-10-CM | POA: Insufficient documentation

## 2019-08-24 DIAGNOSIS — I2510 Atherosclerotic heart disease of native coronary artery without angina pectoris: Secondary | ICD-10-CM | POA: Insufficient documentation

## 2019-08-24 DIAGNOSIS — Z91040 Latex allergy status: Secondary | ICD-10-CM | POA: Insufficient documentation

## 2019-08-24 DIAGNOSIS — Y92129 Unspecified place in nursing home as the place of occurrence of the external cause: Secondary | ICD-10-CM | POA: Insufficient documentation

## 2019-08-24 DIAGNOSIS — Y9389 Activity, other specified: Secondary | ICD-10-CM | POA: Insufficient documentation

## 2019-08-24 DIAGNOSIS — W1830XA Fall on same level, unspecified, initial encounter: Secondary | ICD-10-CM | POA: Diagnosis not present

## 2019-08-24 DIAGNOSIS — Z043 Encounter for examination and observation following other accident: Secondary | ICD-10-CM | POA: Diagnosis present

## 2019-08-24 DIAGNOSIS — W19XXXA Unspecified fall, initial encounter: Secondary | ICD-10-CM

## 2019-08-24 DIAGNOSIS — I252 Old myocardial infarction: Secondary | ICD-10-CM | POA: Diagnosis not present

## 2019-08-24 DIAGNOSIS — S022XXA Fracture of nasal bones, initial encounter for closed fracture: Secondary | ICD-10-CM | POA: Insufficient documentation

## 2019-08-24 NOTE — ED Triage Notes (Signed)
Pt came via EMS from [facility]. Had an unwitnessed fall where pt seemed to have fell and hit head on plastic laundry hamper. Negative for blood thinners. Pt does not remember falling (possible LOC). Pt falls frequently and has many old bruises but what appears new is a lac on L. Upper Eyelid. Small abrasion on nose, and bruising under L eye and on nose. Also has abrasion on R elbow. VSS with EMS. Glucose 92. Pt is oriented to person only (baseline).

## 2019-08-25 ENCOUNTER — Emergency Department (HOSPITAL_COMMUNITY): Payer: Medicare Other

## 2019-08-25 DIAGNOSIS — S022XXA Fracture of nasal bones, initial encounter for closed fracture: Secondary | ICD-10-CM | POA: Diagnosis not present

## 2019-08-25 MED ORDER — OXYCODONE-ACETAMINOPHEN 5-325 MG PO TABS
1.0000 | ORAL_TABLET | Freq: Once | ORAL | Status: AC
  Administered 2019-08-25: 02:00:00 1 via ORAL
  Filled 2019-08-25: qty 1

## 2019-08-25 NOTE — ED Notes (Signed)
Patient transported to CT 

## 2019-08-25 NOTE — ED Notes (Signed)
ptar called for pt 

## 2019-08-25 NOTE — Discharge Instructions (Addendum)
Apply ice for thirty minutes at a time, four times a day.  Take acetaminophen as needed for pain.. 

## 2019-08-25 NOTE — ED Provider Notes (Addendum)
[facility] EMERGENCY DEPARTMENT Provider Note   CSN: [id number] Arrival date & time: 08/24/19  2313    History   Chief Complaint Chief Complaint  Patient presents with   Fall    HPI [name] is a 53 y.o. male.   The history is provided by the nursing home. The history is limited by the condition of the patient (Dementia).  He has history of hyperlipidemia, anoxic brain injury, dementia, atrial fibrillation, coronary artery disease and was transferred from [facility] following an unwitnessed fall.  Patient does not remember a fall.  He is not on any anticoagulants.  Past Medical History:  Diagnosis Date   Addison disease (HCC)    Anoxic brain injury (HCC)    Anxiety    Aspiration pneumonia (HCC)    Atrial fibrillation (HCC)    Chronic constipation 11/23/2011   Dementia (HCC)    Dysphagia    Encephalopathy    Glaucoma    Glucocorticoid deficiency (HCC)    History of recurrent UTIs    Hyperlipemia    Hypothyroidism    Mental disorder    Myocardial infarction (HCC)    Pneumoperitoneum of unknown etiology 12/21/2011   Quadriplegia (HCC)    Reflux    Seizure disorder [facility](HCC)     Patient Active Problem List   Diagnosis Date Noted   PEG Gastrostomy tube in place (HCC) 01/26/2018   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 01/26/2018 01/26/2018   Bedridden 01/26/2018   Quadriplegia (HCC)    Abdominal wall abscess 01/25/2018   Fracture of humerus anatomical neck, right, closed, initial encounter 10/03/2017   AKI (acute kidney injury) (HCC) 10/03/2017   UTI (urinary tract infection) 10/03/2017   Agitation 07/03/2014   Cardiomyopathy, ischemic 06/18/2014   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   Acute psychosis (HCC) 04/19/2014   Unspecified hypothyroidism 05/16/2013   Constipation 05/16/2013   Anemia of other chronic disease 05/16/2013   Mental disorder 01/03/2013    Addisons disease (HCC) 01/03/2013   PEG (percutaneous endoscopic gastrostomy) adjustment/replacement/removal (HCC) 12/21/2011   Hypokalemia 11/25/2011   Thrombocytopenia (HCC) 11/25/2011   FTT (failure to thrive) in adult 11/25/2011   Grand mal seizure (HCC) 11/23/2011   Depression (emotion) 11/23/2011   History of atrial fibrillation 11/23/2011   Other specified hemorrhagic conditions (HCC) 11/23/2011   Glaucoma 11/23/2011   Hypoglycemia 11/23/2011   Chronic constipation 11/23/2011   Anoxic brain injury (HCC) 11/12/2011   Protein calorie malnutrition (HCC) 11/12/2011   Hyperchloremia 11/12/2011   Hyperthyroidism 11/12/2011   Acute lower UTI 11/12/2011   Dysphagia     Past Surgical History:  Procedure Laterality Date   DEBRIDEMENT OF ABDOMINAL WALL ABSCESS  01/26/2018   HERNIA MESH REMOVAL  01/26/2018   Infected mesh - removed w I&D abd wall abscess   IR GENERIC HISTORICAL  10/20/2016   IR REPLC GASTRO/COLONIC TUBE PERCUT W/FLUORO 10/20/2016 Oley Balmaniel Hassell, MD [facility]   IR REPLACE G-TUBE SIMPLE WO FLUORO  01/28/2018   IR REPLC GASTRO/COLONIC TUBE PERCUT W/FLUORO  04/21/2017   IR REPLC GASTRO/COLONIC TUBE PERCUT W/FLUORO  07/22/2017   IR REPLC GASTRO/COLONIC TUBE PERCUT W/FLUORO  11/24/2017   IRRIGATION AND DEBRIDEMENT ABSCESS N/A 01/26/2018   Procedure: IRRIGATION AND DEBRIDEMENT ABDOMINAL WALL ABSCESS WITH REMOVAL OF INFECTED MESH;  Surgeon: Karie SodaGross, Steven, MD;  Location: [facility]S;  Service: General;  Laterality: N/A;   NEPHRECTOMY  unknown   PEG TUBE PLACEMENT  Home Medications    Prior to Admission medications   Medication Sig Start Date End Date Taking? Authorizing Provider  acetaminophen (TYLENOL) 325 MG tablet Place 650 mg into feeding tube every 4 (four) hours as needed for mild pain.    [provider]  Amino Acids-Protein Hydrolys (FEEDING SUPPLEMENT, PRO-STAT SUGAR FREE 64,) LIQD Place 30 mLs into feeding tube 2 (two)  times daily.     [provider]  aspirin 81 MG chewable tablet Chew 81 mg by mouth daily.    [provider]  Carboxymeth-Glycerin-Polysorb (REFRESH OPTIVE ADVANCED OP) Place 1 drop into both eyes 3 (three) times daily.    [provider]  divalproex (DEPAKOTE SPRINKLE) 125 MG capsule Take 375-500 mg by mouth See admin instructions. Take 3 capsules (375mg ) via g-tube every morning, then take 4 capsules (500mg ) via g-tube every evening. 07/18/13   [provider]  levothyroxine (SYNTHROID, LEVOTHROID) 150 MCG tablet Place 150 mcg into feeding tube daily before breakfast.     [provider]  metoprolol tartrate (LOPRESSOR) 25 mg/10 mL SUSP Take 12.5 mg by mouth 2 (two) times daily.    [provider]  Multiple Vitamins-Minerals (CERTAVITE/ANTIOXIDANTS) LIQD 15 mLs by PEG Tube route daily at 12 noon.     [provider]  pantoprazole sodium (PROTONIX) 40 mg/20 mL PACK 40 mg by PEG Tube route daily at 12 noon.  11/30/11   Ernest Haber, MD  PARoxetine (PAXIL) 20 MG tablet Place 20 mg into feeding tube daily.  06/03/17   [provider]  polyethylene glycol (MIRALAX / GLYCOLAX) packet 17 g by PEG Tube route 2 (two) times daily. Mix 17gm  In 4-8 ounces of liquid    [provider]  risperiDONE (RISPERDAL M-TABS) 1 MG disintegrating tablet Place 1 mg under the tongue 3 (three) times daily.  05/08/17   [provider]  sennosides (SENOKOT) 8.8 MG/5ML syrup Place 15 mLs into feeding tube 2 (two) times daily.     [provider]  traZODone (DESYREL) 150 MG tablet Take 75 mg by mouth at bedtime.  08/02/13   [provider]    Family History No family history on file.  Social History Social History   Tobacco Use   Smoking status: Never Smoker   Smokeless tobacco: Never Used  Substance Use Topics   Alcohol use: No   Drug use: No     Allergies   Ativan [lorazepam], Latex, Morphine and  related, Penicillins, Pyridium [phenazopyridine hcl], and Soy allergy   Review of Systems Review of Systems  Unable to perform ROS: Dementia     Physical Exam Updated Vital Signs BP 112/80 (BP Location: Right Arm)    Pulse 63    Temp 98.6 F (37 C) (Oral)    Resp 19    SpO2 99%   Physical Exam Vitals signs and nursing note reviewed.    53 year old male, resting comfortably and in no acute distress. Vital signs are normal. Oxygen saturation is 99%, which is normal. Head is normocephalic.  There is soft tissue swelling as well as a minor abrasion over the bridge of the nose.  There is ecchymosis and soft tissue swelling in the left infraorbital area. PERRLA, EOMI. Oropharynx is clear. Neck is immobilized in a stiff cervical collar and is nontender without adenopathy or JVD. Back is nontender and there is no CVA tenderness. Lungs are clear without rales, wheezes, or rhonchi. Chest is nontender. Heart has regular rate and  rhythm without murmur. Abdomen is soft, flat, nontender without masses or hepatosplenomegaly and peristalsis is normoactive. Extremities have no cyanosis or edema, full range of motion is present.  Minor ecchymosis is noted over the left shoulder. Skin is warm and dry without rash. Neurologic: Mental status is normal, cranial nerves are intact, there are no motor or sensory deficits.  Please note that in spite of diagnosis of quadriplegia, he has good grasp bilaterally and is able to raise his legs on command.  ED Treatments / Results   Radiology Ct Head Wo Contrast  Result Date: 08/25/2019 CLINICAL DATA:  53 year old male with trauma. EXAM: CT HEAD WITHOUT CONTRAST CT MAXILLOFACIAL WITHOUT CONTRAST CT CERVICAL SPINE WITHOUT CONTRAST TECHNIQUE: Multidetector CT imaging of the head, cervical spine, and maxillofacial structures were performed using the standard protocol without intravenous contrast. Multiplanar CT image reconstructions of the cervical spine and  maxillofacial structures were also generated. COMPARISON:  CT port dated 07/04/2019 and head CT dated 07/04/2018. FINDINGS: CT HEAD FINDINGS Brain: There is mild age-related atrophy and chronic microvascular ischemic changes. There is no acute intracranial hemorrhage. No mass effect or midline shift. No extra-axial fluid collection. Vascular: No hyperdense vessel or unexpected calcification. Skull: Normal. Negative for fracture or focal lesion. Other: None CT MAXILLOFACIAL FINDINGS Osseous: There are angulated and depressed fractures of the nasal bone, likely acute or subacute. There is fracture of the bony nasal septum. There is perforation of the cartilaginous nasal septum which can be seen in the setting of substance inhalation. No other acute fracture identified. No mandibular subluxation. Orbits: The globes and retro-orbital fat are preserved. Sinuses: Small right maxillary sinus retention cyst or polyp. There with a of the visualized paranasal sinuses are clear. Soft tissues: Left periorbital and facial soft tissue swelling and small hematoma. CT CERVICAL SPINE FINDINGS Alignment: No acute subluxation. There is straightening of normal cervical lordosis which may be positional or due to muscle spasm or secondary to degenerative changes. Skull base and vertebrae: No acute fracture. Soft tissues and spinal canal: No prevertebral fluid or swelling. No visible canal hematoma. Disc levels: Multilevel degenerative changes with endplate irregularity and disc space narrowing. Upper chest: Negative. Other: None IMPRESSION: 1. No acute intracranial hemorrhage. 2. No acute/traumatic cervical spine pathology. 3. Fractures of the nasal bone and bony nasal septum. Electronically Signed   By: Elgie Collard M.D.   On: 08/25/2019 00:57   Ct Cervical Spine Wo Contrast  Result Date: 08/25/2019 CLINICAL DATA:  53 year old male with trauma. EXAM: CT HEAD WITHOUT CONTRAST CT MAXILLOFACIAL WITHOUT CONTRAST CT CERVICAL SPINE  WITHOUT CONTRAST TECHNIQUE: Multidetector CT imaging of the head, cervical spine, and maxillofacial structures were performed using the standard protocol without intravenous contrast. Multiplanar CT image reconstructions of the cervical spine and maxillofacial structures were also generated. COMPARISON:  CT port dated 07/04/2019 and head CT dated 07/04/2018. FINDINGS: CT HEAD FINDINGS Brain: There is mild age-related atrophy and chronic microvascular ischemic changes. There is no acute intracranial hemorrhage. No mass effect or midline shift. No extra-axial fluid collection. Vascular: No hyperdense vessel or unexpected calcification. Skull: Normal. Negative for fracture or focal lesion. Other: None CT MAXILLOFACIAL FINDINGS Osseous: There are angulated and depressed fractures of the nasal bone, likely acute or subacute. There is fracture of the bony nasal septum. There is perforation of the cartilaginous nasal septum which can be seen in the setting of substance inhalation. No other acute fracture identified. No mandibular subluxation. Orbits: The globes and retro-orbital fat are preserved. Sinuses: Small right  maxillary sinus retention cyst or polyp. There with a of the visualized paranasal sinuses are clear. Soft tissues: Left periorbital and facial soft tissue swelling and small hematoma. CT CERVICAL SPINE FINDINGS Alignment: No acute subluxation. There is straightening of normal cervical lordosis which may be positional or due to muscle spasm or secondary to degenerative changes. Skull base and vertebrae: No acute fracture. Soft tissues and spinal canal: No prevertebral fluid or swelling. No visible canal hematoma. Disc levels: Multilevel degenerative changes with endplate irregularity and disc space narrowing. Upper chest: Negative. Other: None IMPRESSION: 1. No acute intracranial hemorrhage. 2. No acute/traumatic cervical spine pathology. 3. Fractures of the nasal bone and bony nasal septum. Electronically  Signed   By: Elgie Collard M.D.   On: 08/25/2019 00:57   Ct Maxillofacial Wo Contrast  Result Date: 08/25/2019 CLINICAL DATA:  53 year old male with trauma. EXAM: CT HEAD WITHOUT CONTRAST CT MAXILLOFACIAL WITHOUT CONTRAST CT CERVICAL SPINE WITHOUT CONTRAST TECHNIQUE: Multidetector CT imaging of the head, cervical spine, and maxillofacial structures were performed using the standard protocol without intravenous contrast. Multiplanar CT image reconstructions of the cervical spine and maxillofacial structures were also generated. COMPARISON:  CT port dated 07/04/2019 and head CT dated 07/04/2018. FINDINGS: CT HEAD FINDINGS Brain: There is mild age-related atrophy and chronic microvascular ischemic changes. There is no acute intracranial hemorrhage. No mass effect or midline shift. No extra-axial fluid collection. Vascular: No hyperdense vessel or unexpected calcification. Skull: Normal. Negative for fracture or focal lesion. Other: None CT MAXILLOFACIAL FINDINGS Osseous: There are angulated and depressed fractures of the nasal bone, likely acute or subacute. There is fracture of the bony nasal septum. There is perforation of the cartilaginous nasal septum which can be seen in the setting of substance inhalation. No other acute fracture identified. No mandibular subluxation. Orbits: The globes and retro-orbital fat are preserved. Sinuses: Small right maxillary sinus retention cyst or polyp. There with a of the visualized paranasal sinuses are clear. Soft tissues: Left periorbital and facial soft tissue swelling and small hematoma. CT CERVICAL SPINE FINDINGS Alignment: No acute subluxation. There is straightening of normal cervical lordosis which may be positional or due to muscle spasm or secondary to degenerative changes. Skull base and vertebrae: No acute fracture. Soft tissues and spinal canal: No prevertebral fluid or swelling. No visible canal hematoma. Disc levels: Multilevel degenerative changes with  endplate irregularity and disc space narrowing. Upper chest: Negative. Other: None IMPRESSION: 1. No acute intracranial hemorrhage. 2. No acute/traumatic cervical spine pathology. 3. Fractures of the nasal bone and bony nasal septum. Electronically Signed   By: Elgie Collard M.D.   On: 08/25/2019 00:57    Procedures Procedures  Medications Ordered in ED Medications  oxyCODONE-acetaminophen (PERCOCET/ROXICET) 5-325 MG per tablet 1 tablet (has no administration in time range)     Initial Impression / Assessment and Plan / ED Course  I have reviewed the triage vital signs and the nursing notes.  Pertinent labs & imaging results that were available during my care of the patient were reviewed by me and considered in my medical decision making (see chart for details).  Fall at nursing home.  Evidence of facial injury.  Old records reviewed, and he has several other ED visits for falls.  He will be sent for CT scans of head, maxillofacial bones, cervical spine.  CT scans show evidence of nasal fracture but no other acute injury.  He is discharged with instructions to apply ice, use over-the-counter analgesics as needed for pain.  Referred to ENT for follow-up as needed.  Final Clinical Impressions(s) / ED Diagnoses   Final diagnoses:  Fall at nursing home, initial encounter  Closed fracture of nasal bone, initial encounter    ED Discharge Orders    None       Dione BoozeGlick, Emmaclaire Switala, MD 08/25/19 Jackey [id number]    Dione BoozeGlick, Patricia Fargo, MD 08/25/19 [phone]

## 2019-08-25 NOTE — ED Notes (Signed)
Discharge instructions discused with pt by Launa Flight RN

## 2019-08-25 NOTE — ED Notes (Signed)
RN Lunette Stands Called report to [facility]. Pt leaving facility at this time. Pt stable.

## 2019-08-28 ENCOUNTER — Other Ambulatory Visit: Payer: Self-pay

## 2019-08-28 ENCOUNTER — Emergency Department (HOSPITAL_COMMUNITY)
Admission: EM | Admit: 2019-08-28 | Discharge: 2019-08-29 | Disposition: A | Discharge status: Home / Self Care | Payer: Medicare Other | Attending: Emergency Medicine | Admitting: Emergency Medicine

## 2019-08-28 ENCOUNTER — Encounter (HOSPITAL_COMMUNITY): Payer: Self-pay | Admitting: Emergency Medicine

## 2019-08-28 DIAGNOSIS — W06XXXA Fall from bed, initial encounter: Secondary | ICD-10-CM | POA: Diagnosis not present

## 2019-08-28 DIAGNOSIS — S0990XA Unspecified injury of head, initial encounter: Secondary | ICD-10-CM | POA: Diagnosis present

## 2019-08-28 DIAGNOSIS — I4891 Unspecified atrial fibrillation: Secondary | ICD-10-CM | POA: Diagnosis not present

## 2019-08-28 DIAGNOSIS — Z931 Gastrostomy status: Secondary | ICD-10-CM | POA: Diagnosis not present

## 2019-08-28 DIAGNOSIS — Y999 Unspecified external cause status: Secondary | ICD-10-CM | POA: Diagnosis not present

## 2019-08-28 DIAGNOSIS — Y939 Activity, unspecified: Secondary | ICD-10-CM | POA: Insufficient documentation

## 2019-08-28 DIAGNOSIS — Z7982 Long term (current) use of aspirin: Secondary | ICD-10-CM | POA: Insufficient documentation

## 2019-08-28 DIAGNOSIS — E039 Hypothyroidism, unspecified: Secondary | ICD-10-CM | POA: Diagnosis not present

## 2019-08-28 DIAGNOSIS — F0390 Unspecified dementia without behavioral disturbance: Secondary | ICD-10-CM | POA: Insufficient documentation

## 2019-08-28 DIAGNOSIS — S060X9A Concussion with loss of consciousness of unspecified duration, initial encounter: Secondary | ICD-10-CM

## 2019-08-28 DIAGNOSIS — W19XXXA Unspecified fall, initial encounter: Secondary | ICD-10-CM

## 2019-08-28 DIAGNOSIS — Y92122 Bedroom in nursing home as the place of occurrence of the external cause: Secondary | ICD-10-CM | POA: Diagnosis not present

## 2019-08-28 DIAGNOSIS — Z79899 Other long term (current) drug therapy: Secondary | ICD-10-CM | POA: Insufficient documentation

## 2019-08-28 NOTE — ED Provider Notes (Signed)
[facility] EMERGENCY DEPARTMENT Provider Note   CSN: [id number] Arrival date & time: 08/28/19  2322     History   Chief Complaint Chief Complaint  Patient presents with   Fall   Level 5 caveat due to altered mental status HPI [name] is a 53 y.o. male.     The history is provided by the EMS personnel and the nursing home. The history is limited by the condition of the patient.  Fall  Patient with multiple medical conditions including anoxic brain injury, atrial fibrillation, dementia, PEG tube in place presents with fall from nursing home Patient has a history of frequent falls.  Apparently he fell out of bed tonight.  He was seen recently for a fall. Past Medical History:  Diagnosis Date   Addison disease ([place])    Anoxic brain injury ([place])    Anxiety    Aspiration pneumonia ([place])    Atrial fibrillation ([place])    Chronic constipation 11/23/2011   Dementia ([place])    Dysphagia    Encephalopathy    Glaucoma    Glucocorticoid deficiency ([place])    History of recurrent UTIs    Hyperlipemia    Hypothyroidism    Mental disorder    Myocardial infarction ([place])    Pneumoperitoneum of unknown etiology 12/21/2011   Quadriplegia ([place])    Reflux    Seizure disorder [facility])     Patient Active Problem List   Diagnosis Date Noted   PEG Gastrostomy tube in place ([place]) 01/26/2018   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 01/26/2018 01/26/2018   Bedridden 01/26/2018   Quadriplegia ([place])    Abdominal wall abscess 01/25/2018   Fracture of humerus anatomical neck, right, closed, initial encounter 10/03/2017   AKI (acute kidney injury) ([place]) 10/03/2017   UTI (urinary tract infection) 10/03/2017   Agitation 07/03/2014   Cardiomyopathy, ischemic 06/18/2014   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   Acute psychosis ([place]) 04/19/2014   Unspecified hypothyroidism 05/16/2013   Constipation 05/16/2013   Anemia of other  chronic disease 05/16/2013   Mental disorder 01/03/2013   Addisons disease ([place]) 01/03/2013   PEG (percutaneous endoscopic gastrostomy) adjustment/replacement/removal ([place]) 12/21/2011   Hypokalemia 11/25/2011   Thrombocytopenia ([place]) 11/25/2011   FTT (failure to thrive) in adult 11/25/2011   Grand mal seizure ([place]) 11/23/2011   Depression ([place]) 11/23/2011   History of atrial fibrillation 11/23/2011   Other specified hemorrhagic conditions ([place]) 11/23/2011   Glaucoma 11/23/2011   Hypoglycemia 11/23/2011   Chronic constipation 11/23/2011   Anoxic brain injury ([place]) 11/12/2011   Protein calorie malnutrition ([place]) 11/12/2011   Hyperchloremia 11/12/2011   Hyperthyroidism 11/12/2011   Acute lower UTI 11/12/2011   Dysphagia     Past Surgical History:  Procedure Laterality Date   DEBRIDEMENT OF ABDOMINAL WALL ABSCESS  01/26/2018   HERNIA MESH REMOVAL  01/26/2018   Infected mesh - removed w I&D abd wall abscess   IR GENERIC HISTORICAL  10/20/2016   IR REPLC GASTRO/COLONIC TUBE PERCUT W/FLUORO 10/20/2016 Arne Cleveland, MD [facility]-INTERV RAD   IR REPLACE G-TUBE SIMPLE WO FLUORO  01/28/2018   IR [place] GASTRO/COLONIC TUBE PERCUT W/FLUORO  04/21/2017   IR [place] GASTRO/COLONIC TUBE PERCUT W/FLUORO  07/22/2017   IR [place] GASTRO/COLONIC TUBE PERCUT W/FLUORO  11/24/2017   IRRIGATION AND DEBRIDEMENT ABSCESS N/A 01/26/2018   Procedure: IRRIGATION AND DEBRIDEMENT ABDOMINAL WALL ABSCESS WITH REMOVAL OF INFECTED MESH;  Surgeon: Michael Boston, MD;  Location: [facility] ORS;  Service: General;  Laterality: N/A;  NEPHRECTOMY  unknown   PEG TUBE PLACEMENT          Home Medications    Prior to Admission medications   Medication Sig Start Date End Date Taking? Authorizing Provider  acetaminophen (TYLENOL) 325 MG tablet Place 650 mg into feeding tube every 4 (four) hours as needed for mild pain.    [provider]  Amino Acids-Protein Hydrolys (FEEDING SUPPLEMENT, PRO-STAT  SUGAR FREE 64,) LIQD Place 30 mLs into feeding tube 2 (two) times daily.     [provider]  aspirin 81 MG chewable tablet Chew 81 mg by mouth daily.    [provider]  Carboxymeth-Glycerin-Polysorb (REFRESH OPTIVE ADVANCED OP) Place 1 drop into both eyes 3 (three) times daily.    [provider]  divalproex (DEPAKOTE SPRINKLE) 125 MG capsule Take 375-500 mg by mouth See admin instructions. Take 3 capsules (375mg ) via g-tube every morning, then take 4 capsules (500mg ) via g-tube every evening. 07/18/13   [provider]  levothyroxine (SYNTHROID, LEVOTHROID) 150 MCG tablet Place 150 mcg into feeding tube daily before breakfast.     [provider]  metoprolol tartrate (LOPRESSOR) 25 mg/10 mL SUSP Take 12.5 mg by mouth 2 (two) times daily.    [provider]  Multiple Vitamins-Minerals (CERTAVITE/ANTIOXIDANTS) LIQD 15 mLs by PEG Tube route daily at 12 noon.     [provider]  pantoprazole sodium (PROTONIX) 40 mg/20 mL PACK 40 mg by PEG Tube route daily at 12 noon.  11/30/11   [name], MD  PARoxetine (PAXIL) 20 MG tablet Place 20 mg into feeding tube daily.  06/03/17   [provider]  polyethylene glycol (MIRALAX / GLYCOLAX) packet 17 g by PEG Tube route 2 (two) times daily. Mix 17gm  In 4-8 ounces of liquid    [provider]  risperiDONE (RISPERDAL M-TABS) 1 MG disintegrating tablet Place 1 mg under the tongue 3 (three) times daily.  05/08/17   [provider]  sennosides (SENOKOT) 8.8 MG/5ML syrup Place 15 mLs into feeding tube 2 (two) times daily.     [provider]  traZODone (DESYREL) 150 MG tablet Take 75 mg by mouth at bedtime.  08/02/13   [provider]    Family History No family history on file.  Social History Social History   Tobacco Use   Smoking status: Never Smoker   Smokeless tobacco: Never Used  Substance Use Topics   Alcohol use: No   Drug use: No      Allergies   Ativan [lorazepam], Latex, Morphine and related, Penicillins, Pyridium [phenazopyridine hcl], and Soy allergy   Review of Systems Review of Systems  Unable to perform ROS: Mental status change     Physical Exam Updated Vital Signs There were no vitals taken for this visit.  Physical Exam CONSTITUTIONAL: Chronically ill-appearing, no acute distress.  Grinding teeth constantly HEAD: Hematoma surrounding left orbit.  Left hyphema noted.  Bruising noted to forehead. EYES: EOMI/PERRL ENMT: Mucous membranes moist, abrasion to bridge of nose.  No septal hematoma.  No other obvious facial trauma NECK: supple no meningeal signs SPINE/BACK: No cervical spine tenderness CV: S1/S2 noted, no murmurs/rubs/gallops noted LUNGS: Lungs are clear to auscultation bilaterally, no apparent distress ABDOMEN: soft, nontender, PEG tube in place GU:no cva tenderness NEURO: Pt is awake.  Patient follows commands.  He is nonverbal at this time. EXTREMITIES: pulses normal/equal, full ROM, pelvis stable. All other extremities/joints palpated/ranged and nontender SKIN: warm, color normal PSYCH:  Unable to assess ED Treatments / Results  Labs (all labs ordered are listed, but only abnormal results are displayed) Labs Reviewed - No data to display  EKG None  Radiology Ct Head Wo Contrast  Result Date: 08/29/2019 CLINICAL DATA:  Fall out of bed. Laceration above right eye. Frequent falls. EXAM: CT HEAD WITHOUT CONTRAST TECHNIQUE: Contiguous axial images were obtained from the base of the skull through the vertex without intravenous contrast. COMPARISON:  Multiple prior exams most recent head CT 3 days ago. FINDINGS: Brain: No evidence of acute infarction, hemorrhage, hydrocephalus, extra-axial collection or mass lesion/mass effect. Unchanged atrophy and chronic small vessel ischemia. Vascular: Atherosclerosis of skullbase vasculature without hyperdense vessel or abnormal calcification.  Skull: No fracture or focal lesion. Sinuses/Orbits: Right supraorbital hematoma is unchanged from recent exam. Other: None. IMPRESSION: No acute intracranial abnormality or skull fracture. Electronically Signed   By: Narda RutherfordMelanie  Sanford M.D.   On: 08/29/2019 02:07    Procedures Procedures   Medications Ordered in ED Medications - No data to display   Initial Impression / Assessment and Plan / ED Course  I have reviewed the triage vital signs and the nursing notes.  Pertinent  imaging results that were available during my care of the patient were reviewed by me and considered in my medical decision making (see chart for details).        11:39 PM [facility].  No one answered the phone. This is fourth ER visit this summer for patient for fall. Apparently he is at his baseline with mental status. Most of bruising is noted on his face around his eyes is from recent fall.  He sustained a nasal fracture on August 28  He does not appear to be on anticoagulants.  Will obtain CT head at this time.  No other signs of new traumatic injury 11:47 PM I informed mother by phone about his fall/ER visit and she was made aware of recent nasal fracture 2:18 AM CT head negative.  Patient has been stable in the ER. It appears the bruising around the left eye is from previous fall when he was seen on August 28 No other signs of acute traumatic injury. I spoke to the nurse at his nursing facility they are aware of him returning. The particular nurse I spoke to was not there when patient fell.  Final Clinical Impressions(s) / ED Diagnoses   Final diagnoses:  Fall, initial encounter  Concussion with loss of consciousness, initial encounter    ED Discharge Orders    None       Zadie RhineWickline, Eyvette Cordon, MD 08/29/19 [phone]

## 2019-08-28 NOTE — ED Triage Notes (Signed)
Pt arrives via [facility] for c/o fall OOB. Ems reports that patient is not on blood thinners and staff reports no LOC. Pt is alert and oriented to person only which is his baseline. Pt has history of frequent falls, seen here on 8/27 for same. Multiple old bruises. New lac to bridge of nose and above R eye. EMS VSS.

## 2019-08-29 ENCOUNTER — Emergency Department (HOSPITAL_COMMUNITY): Payer: Medicare Other

## 2019-08-29 DIAGNOSIS — S060X9A Concussion with loss of consciousness of unspecified duration, initial encounter: Secondary | ICD-10-CM | POA: Diagnosis not present

## 2019-08-29 NOTE — ED Notes (Signed)
Dr. Christy Gentles called [name] to speak with staff regarding patient's injuries, MD advised he let staff at facility know that patient would be returning.

## 2019-08-29 NOTE — ED Notes (Signed)
[facility]AR at bedside, all transport/discharge paperwork given to PTAR.

## 2019-08-29 NOTE — ED Notes (Signed)
Patient transported to CT 

## 2019-09-27 IMAGING — CT CT HEAD WITHOUT CONTRAST
4 of 12 series · 15 of 47 positions shown, 18 images · non-contrast
Comparison: CT port dated 07/04/2019 and head CT dated 07/04/2018.

CLINICAL DATA: 52-year-old male with trauma.

EXAM:
CT HEAD WITHOUT CONTRAST
CT MAXILLOFACIAL WITHOUT CONTRAST
CT CERVICAL SPINE WITHOUT CONTRAST
TECHNIQUE: Multidetector CT imaging of the head, cervical spine, and
maxillofacial structures were performed using the standard protocol
without intravenous contrast. Multiplanar CT image reconstructions
of the cervical spine and maxillofacial structures were also
generated.

[Series 7: head 3.0 mpr sag · sagittal · 0.36mm/px · 1 of 63 slices shown]
[im 32/63  brain]
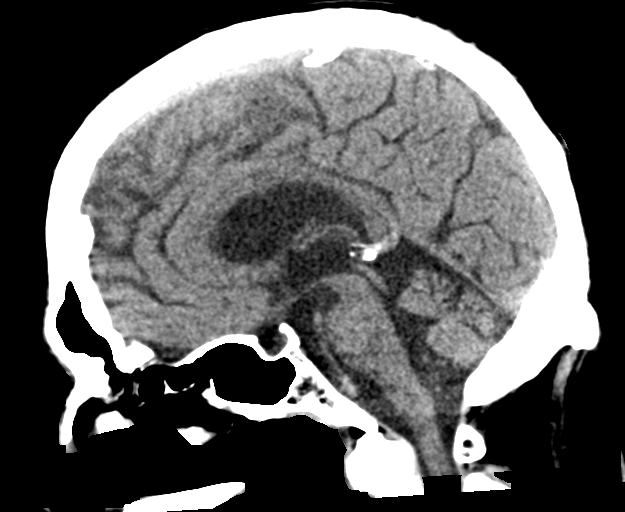

[Series 10: 1.0 thin soft tissue · axial · 0.38mm/px · z∈[-193,-32]mm · 10 of 197 slices shown, 13 images]
[im 18/197  brain]
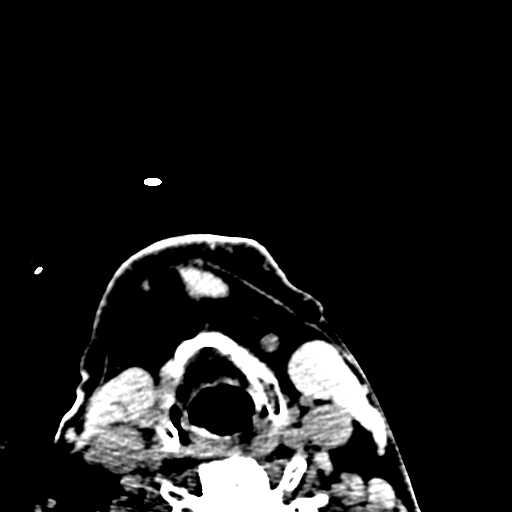
[im 18/197  bone]
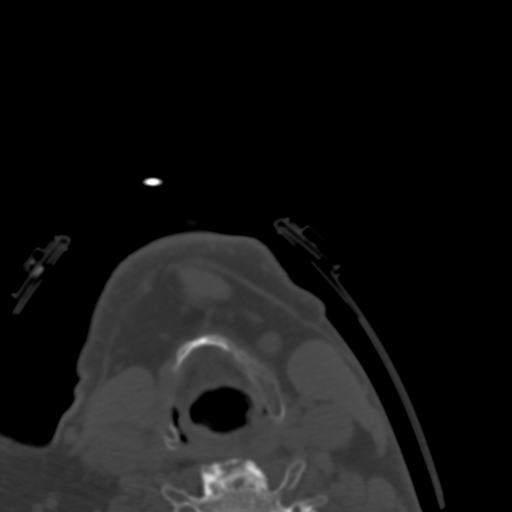
[im 36/197  brain]
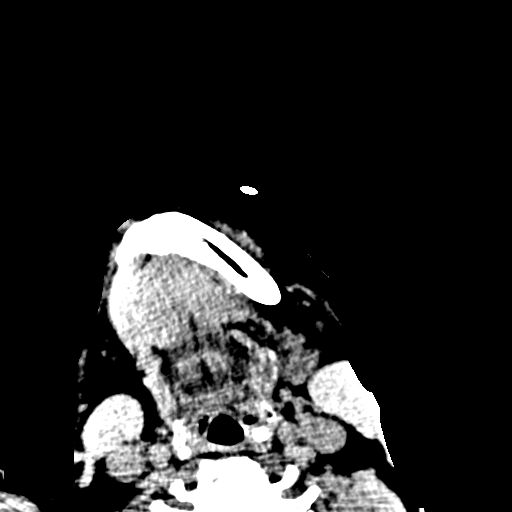
[im 54/197  brain]
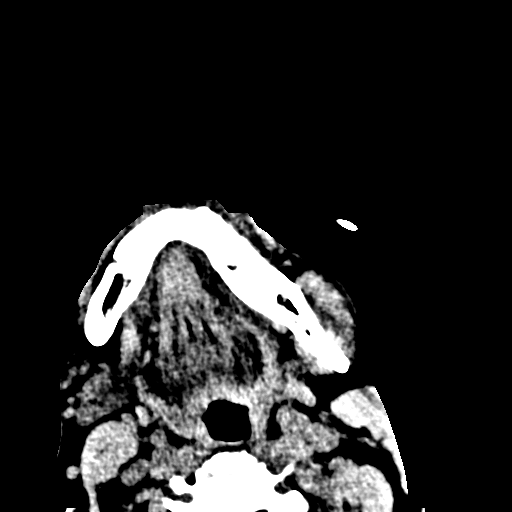
[im 72/197  brain]
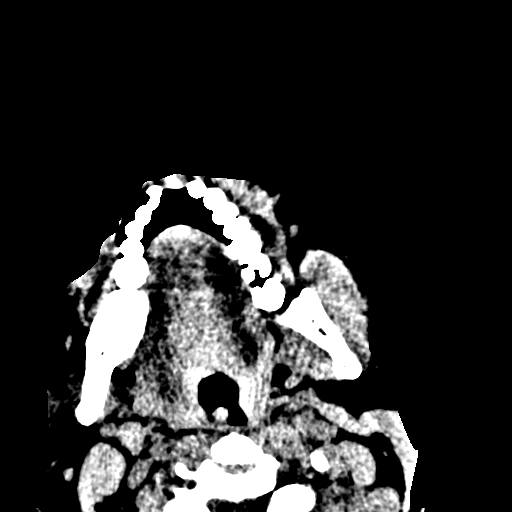
[im 90/197  brain]
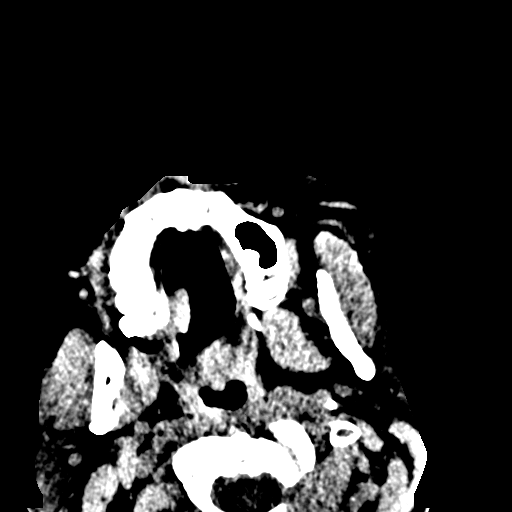
[im 90/197  bone]
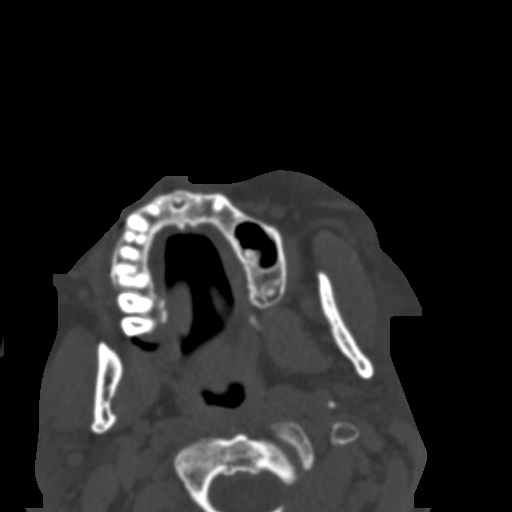
[im 107/197  brain]
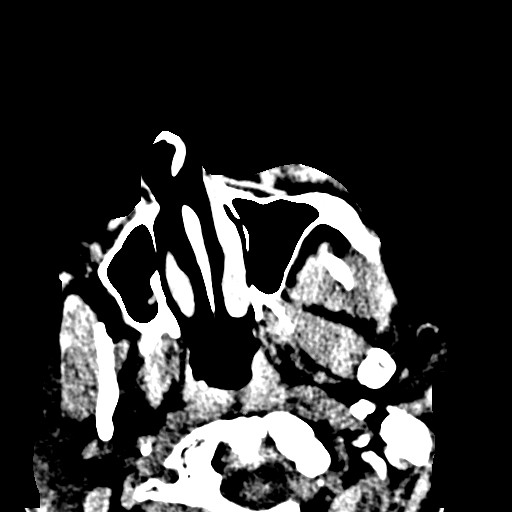
[im 125/197  brain]
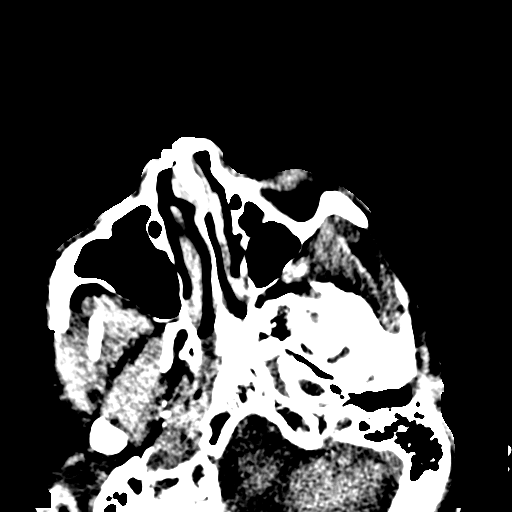
[im 143/197  brain]
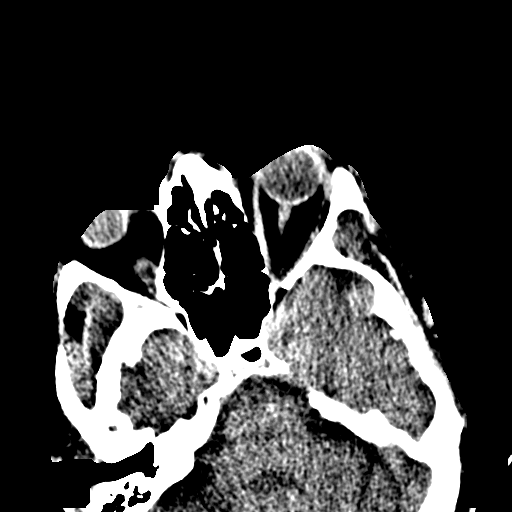
[im 161/197  brain]
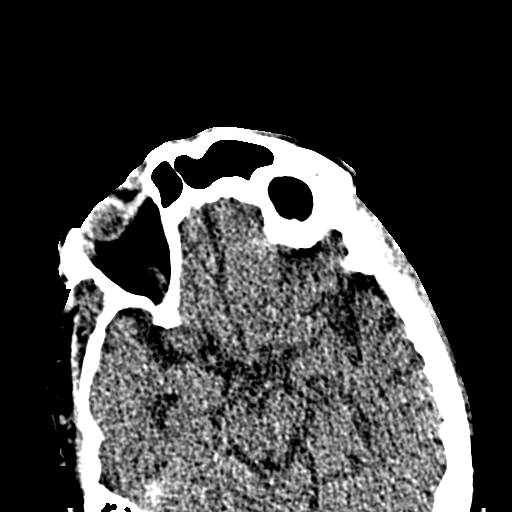
[im 161/197  bone]
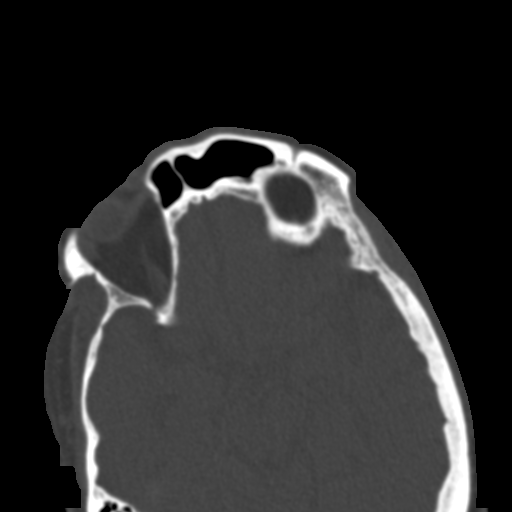
[im 179/197  brain]
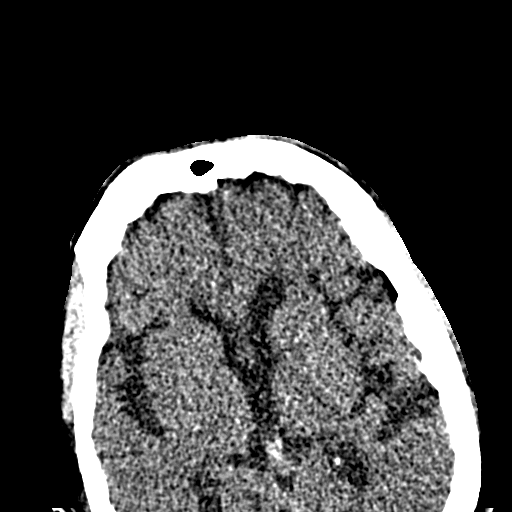

[Series 12: coronal soft tissue · coronal · 0.38mm/px · 1 of 76 slices shown]
[im 38/76  brain]
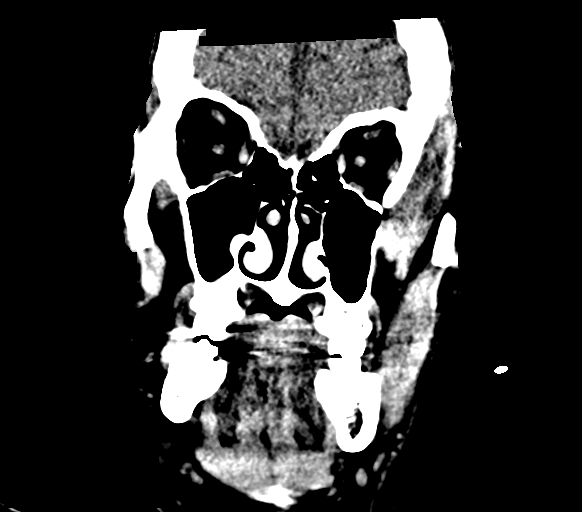

[Series 21: orthogonal axials st · axial · 0.21mm/px · z∈[-218,-140]mm · 3 of 83 slices shown]
[im 21/83  brain]
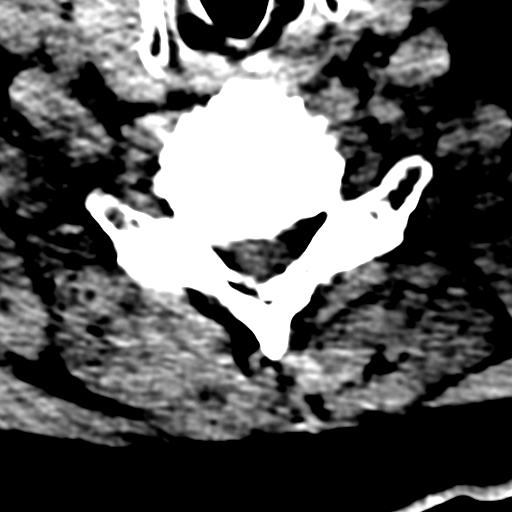
[im 42/83  brain]
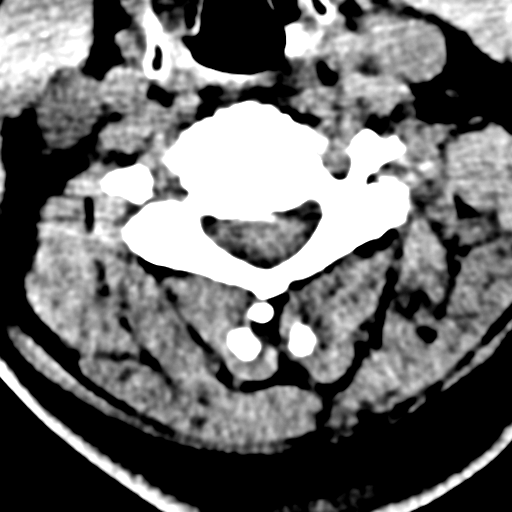
[im 62/83  brain]
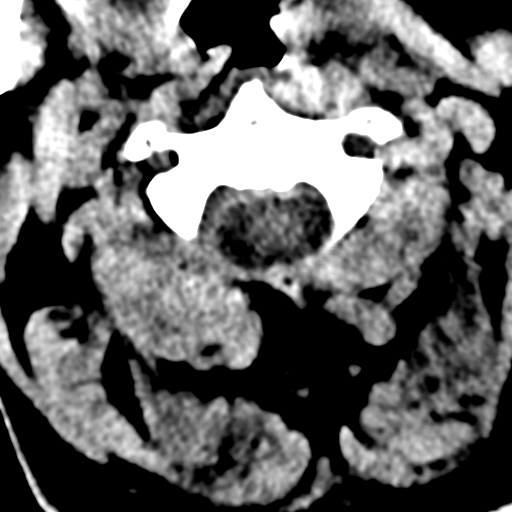

[15 of 47 positions shown; findings below may reference images not displayed]

FINDINGS: CT HEAD FINDINGS

Brain: There is mild age-related atrophy and chronic microvascular
ischemic changes. There is no acute intracranial hemorrhage. No mass
effect or midline shift. No extra-axial fluid collection.

Vascular: No hyperdense vessel or unexpected calcification.

Skull: Normal. Negative for fracture or focal lesion.

Other: None

CT MAXILLOFACIAL FINDINGS

Osseous: There are angulated and depressed fractures of the nasal
bone, likely acute or subacute. There is fracture of the bony nasal
septum. There is perforation of the cartilaginous nasal septum which
can be seen in the setting of substance inhalation. No other acute
fracture identified. No mandibular subluxation.

Orbits: The globes and retro-orbital fat are preserved.

Sinuses: Small right maxillary sinus retention cyst or polyp. There
with a of the visualized paranasal sinuses are clear.

Soft tissues: Left periorbital and facial soft tissue swelling and
small hematoma.

CT CERVICAL SPINE FINDINGS

Alignment: No acute subluxation. There is straightening of normal
cervical lordosis which may be positional or due to muscle spasm or
secondary to degenerative changes.

Skull base and vertebrae: No acute fracture.

Soft tissues and spinal canal: No prevertebral fluid or swelling. No
visible canal hematoma.

Disc levels: Multilevel degenerative changes with endplate
irregularity and disc space narrowing.

Upper chest: Negative.

Other: None
IMPRESSION: 1. No acute intracranial hemorrhage.
2. No acute/traumatic cervical spine pathology.
3. Fractures of the nasal bone and bony nasal septum.

## 2019-09-27 IMAGING — CT CT MAXILLOFACIAL WITHOUT CONTRAST
6 of 12 series · 17 of 47 positions shown, 18 images · non-contrast
Comparison: CT port dated 07/04/2019 and head CT dated 07/04/2018.

CLINICAL DATA: 52-year-old male with trauma.

EXAM:
CT HEAD WITHOUT CONTRAST
CT MAXILLOFACIAL WITHOUT CONTRAST
CT CERVICAL SPINE WITHOUT CONTRAST
TECHNIQUE: Multidetector CT imaging of the head, cervical spine, and
maxillofacial structures were performed using the standard protocol
without intravenous contrast. Multiplanar CT image reconstructions
of the cervical spine and maxillofacial structures were also
generated.

[Series 7: head 3.0 mpr sag · sagittal · 0.36mm/px · 1 of 63 slices shown]
[im 32/63  bone]
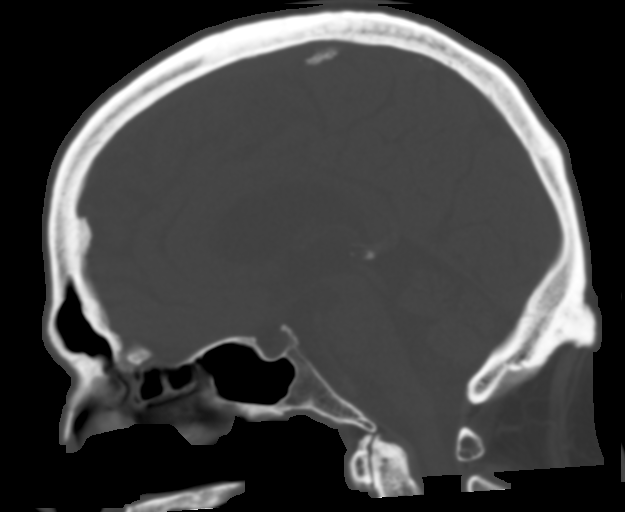

[Series 8: facial/ orbits 2.0 h30s · axial · 0.38mm/px · z∈[-172,-54]mm · 4 of 99 slices shown, 5 images]
[im 20/99  brain]
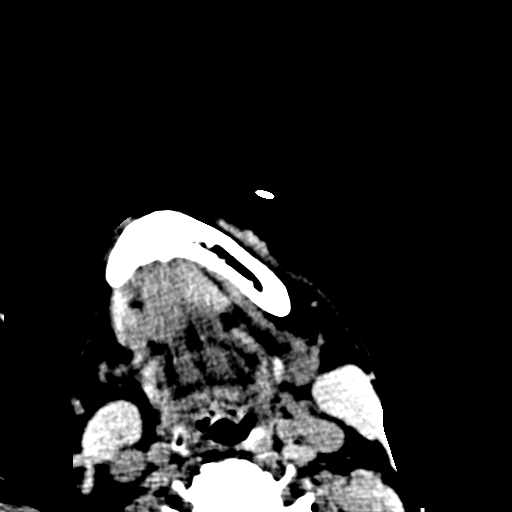
[im 20/99  bone]
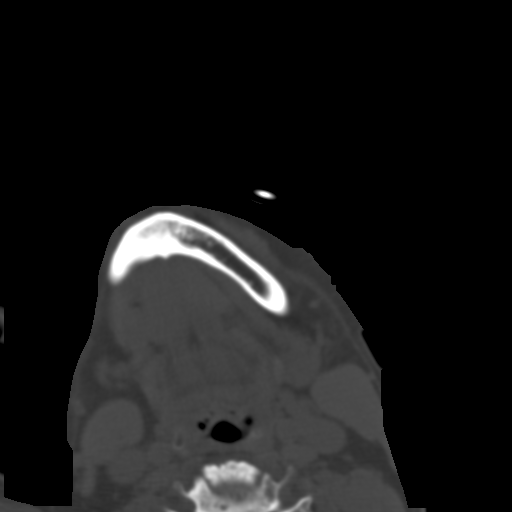
[im 40/99  bone]
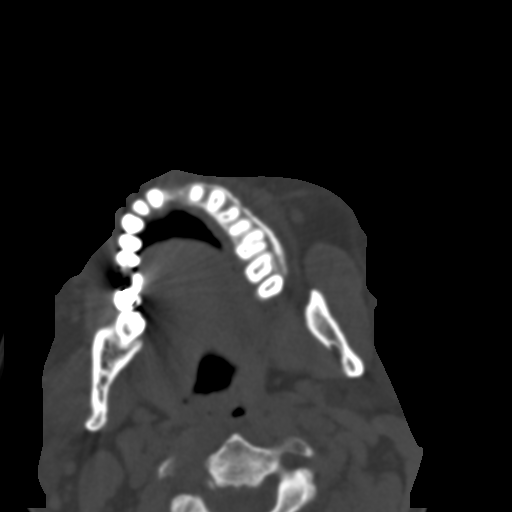
[im 59/99  bone]
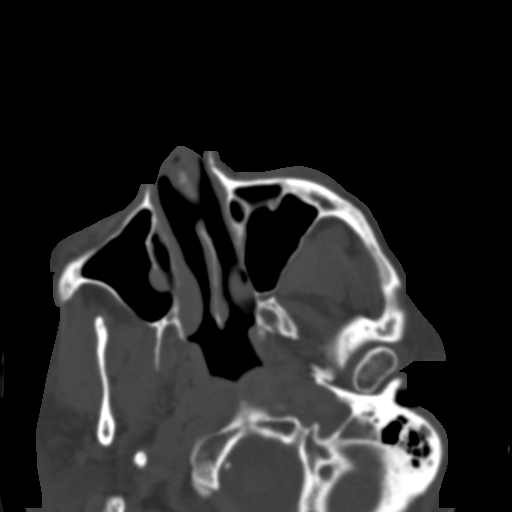
[im 79/99  bone]
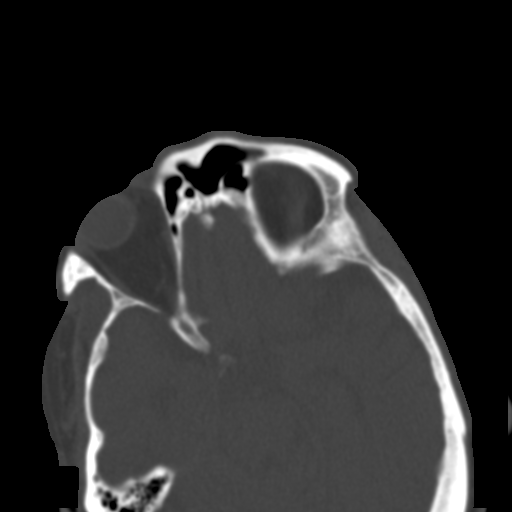

[Series 10: 1.0 thin soft tissue · axial · 0.38mm/px · z∈[-193,-104]mm · 5 of 197 slices shown]
[im 18/197  brain]
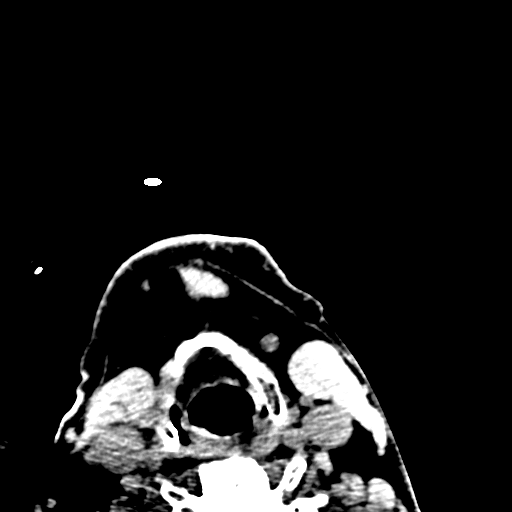
[im 36/197  brain]
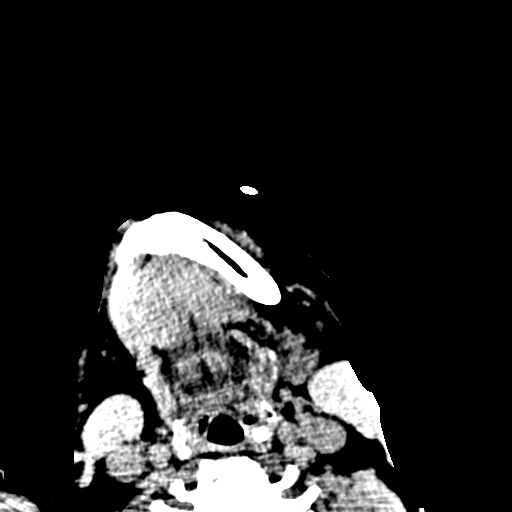
[im 72/197  brain]
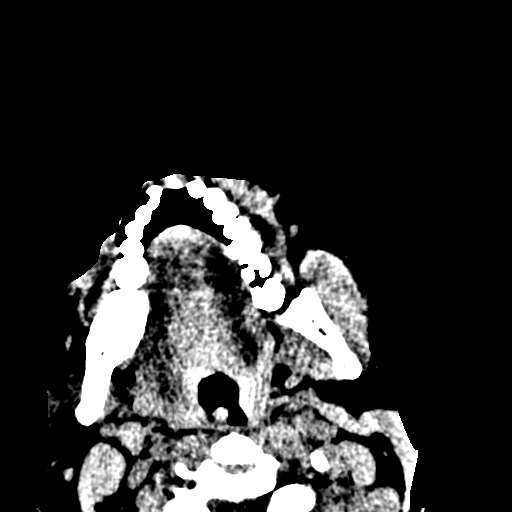
[im 90/197  brain]
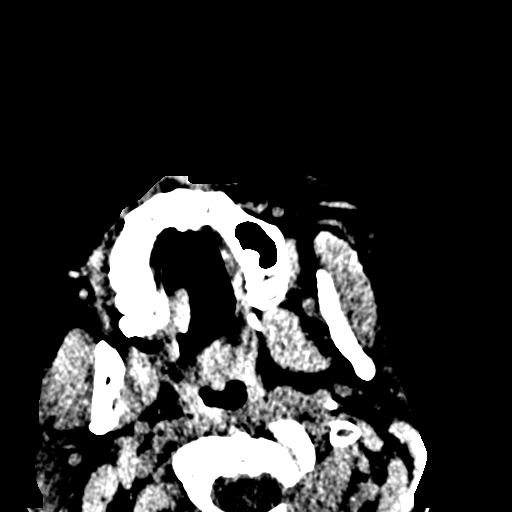
[im 107/197  brain]
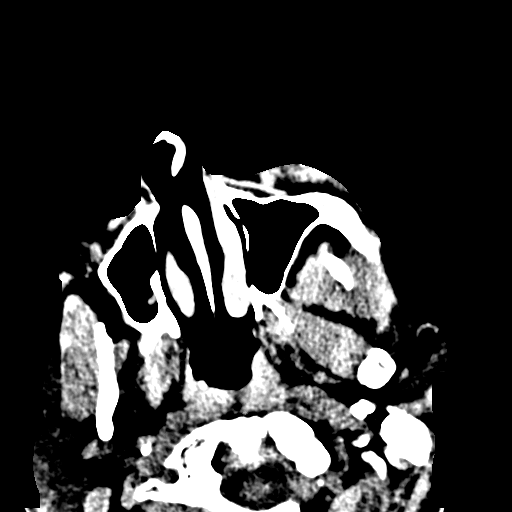

[Series 14: coronal bone · coronal · 0.38mm/px · 1 of 76 slices shown]
[im 38/76  bone]
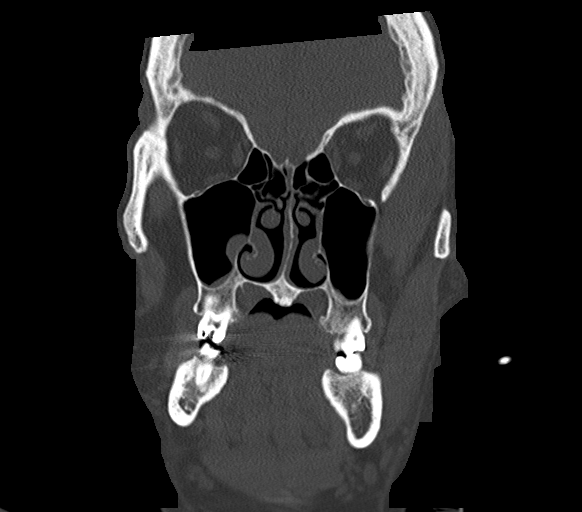

[Series 17: c_spine 2.0 3 st · axial · 0.26mm/px · z∈[-205,-117]mm · 3 of 90 slices shown]
[im 23/90  bone]
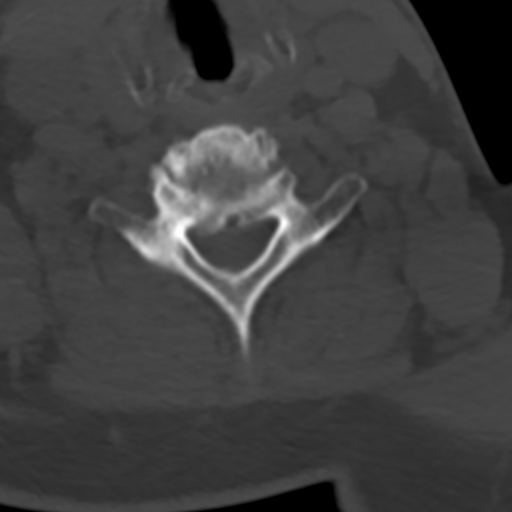
[im 45/90  bone]
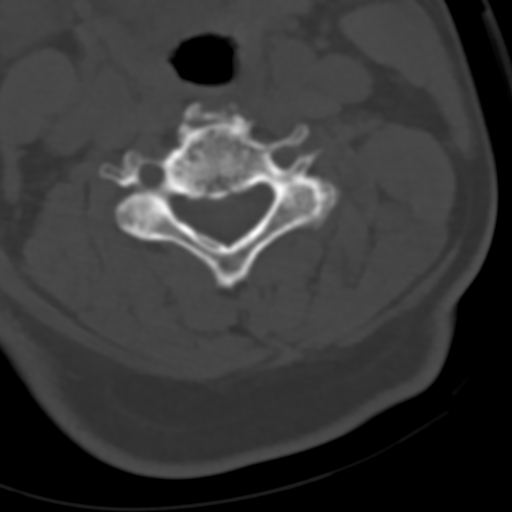
[im 67/90  bone]
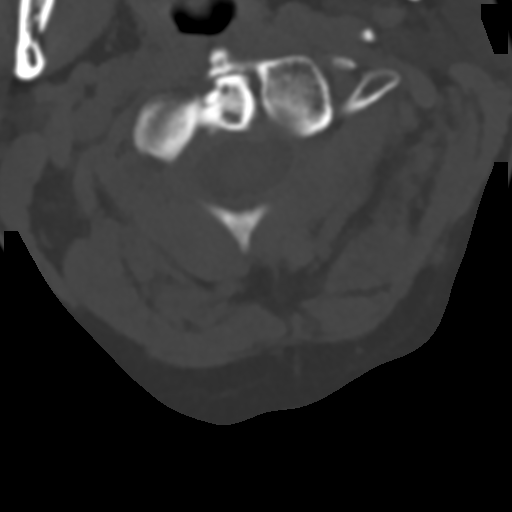

[Series 21: orthogonal axials st · axial · 0.21mm/px · z∈[-218,-140]mm · 3 of 83 slices shown]
[im 21/83  bone]
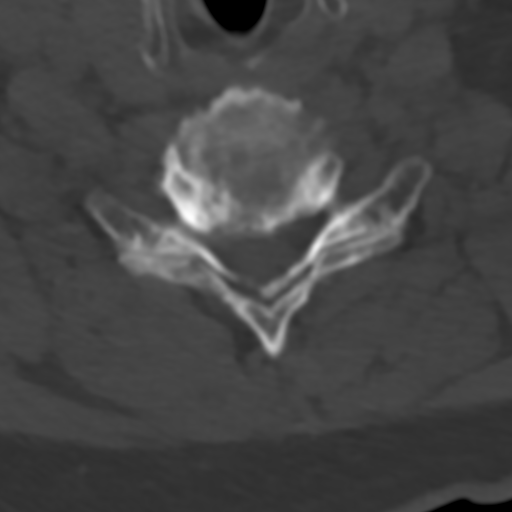
[im 42/83  bone]
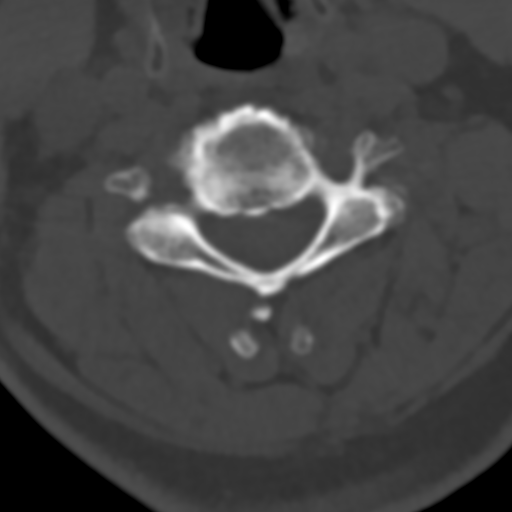
[im 62/83  bone]
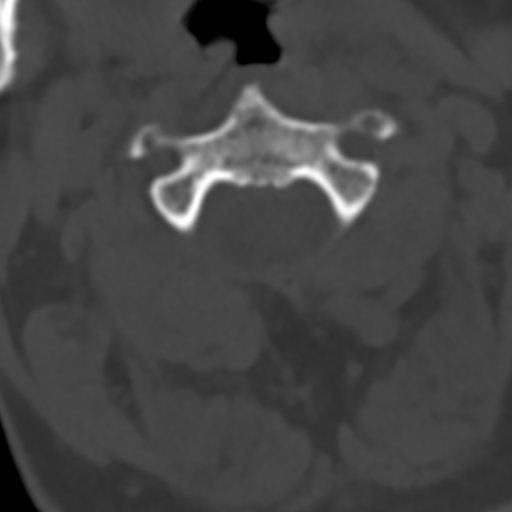

[17 of 47 positions shown; findings below may reference images not displayed]

FINDINGS: CT HEAD FINDINGS

Brain: There is mild age-related atrophy and chronic microvascular
ischemic changes. There is no acute intracranial hemorrhage. No mass
effect or midline shift. No extra-axial fluid collection.

Vascular: No hyperdense vessel or unexpected calcification.

Skull: Normal. Negative for fracture or focal lesion.

Other: None

CT MAXILLOFACIAL FINDINGS

Osseous: There are angulated and depressed fractures of the nasal
bone, likely acute or subacute. There is fracture of the bony nasal
septum. There is perforation of the cartilaginous nasal septum which
can be seen in the setting of substance inhalation. No other acute
fracture identified. No mandibular subluxation.

Orbits: The globes and retro-orbital fat are preserved.

Sinuses: Small right maxillary sinus retention cyst or polyp. There
with a of the visualized paranasal sinuses are clear.

Soft tissues: Left periorbital and facial soft tissue swelling and
small hematoma.

CT CERVICAL SPINE FINDINGS

Alignment: No acute subluxation. There is straightening of normal
cervical lordosis which may be positional or due to muscle spasm or
secondary to degenerative changes.

Skull base and vertebrae: No acute fracture.

Soft tissues and spinal canal: No prevertebral fluid or swelling. No
visible canal hematoma.

Disc levels: Multilevel degenerative changes with endplate
irregularity and disc space narrowing.

Upper chest: Negative.

Other: None
IMPRESSION: 1. No acute intracranial hemorrhage.
2. No acute/traumatic cervical spine pathology.
3. Fractures of the nasal bone and bony nasal septum.

## 2019-09-27 IMAGING — CT CT CERVICAL SPINE WITHOUT CONTRAST
4 of 12 series · 9 of 33 positions shown, 10 images · non-contrast
Comparison: CT port dated 07/04/2019 and head CT dated 07/04/2018.

CLINICAL DATA: 52-year-old male with trauma.

EXAM:
CT HEAD WITHOUT CONTRAST
CT MAXILLOFACIAL WITHOUT CONTRAST
CT CERVICAL SPINE WITHOUT CONTRAST
TECHNIQUE: Multidetector CT imaging of the head, cervical spine, and
maxillofacial structures were performed using the standard protocol
without intravenous contrast. Multiplanar CT image reconstructions
of the cervical spine and maxillofacial structures were also
generated.

[Series 8: facial/ orbits 2.0 h30s · axial · 0.38mm/px · z∈[-210,-14]mm · 3 of 99 slices shown, 4 images]
[im 1/99  soft-tissue]
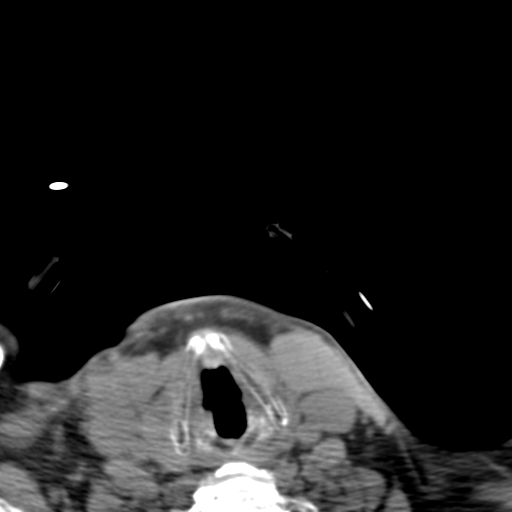
[im 1/99  bone]
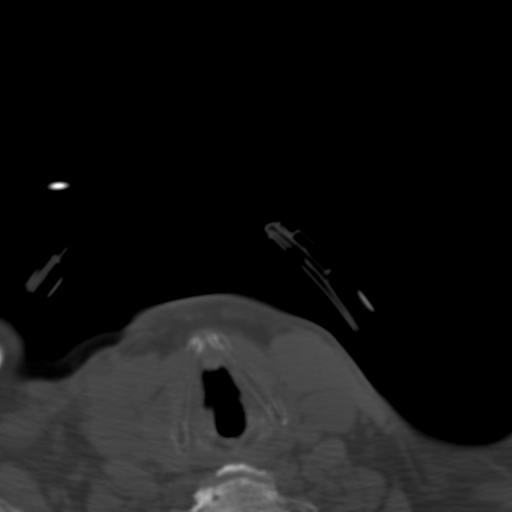
[im 50/99  bone]
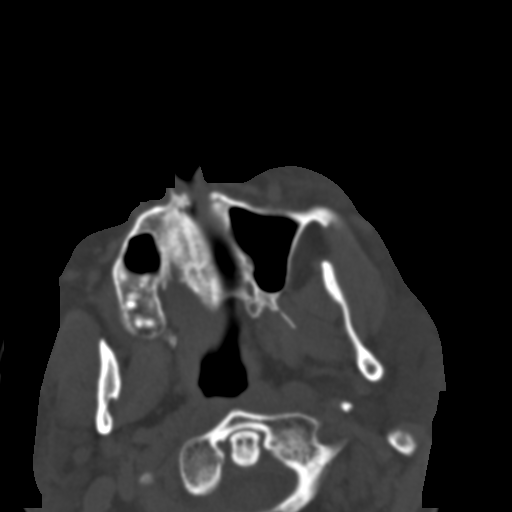
[im 99/99  bone]
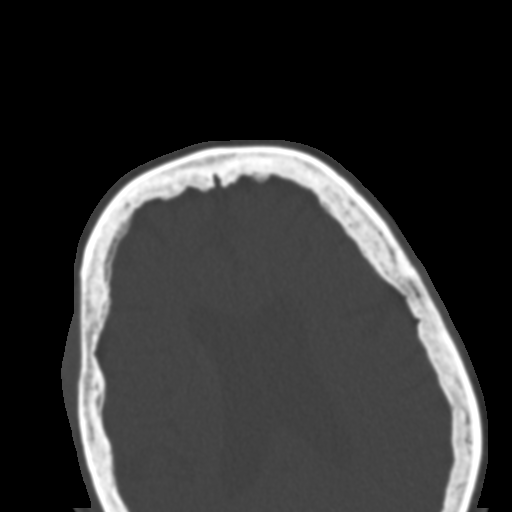

[Series 10: 1.0 thin soft tissue · axial · 0.38mm/px · z∈[-161,-63]mm · 3 of 197 slices shown]
[im 50/197  soft-tissue]
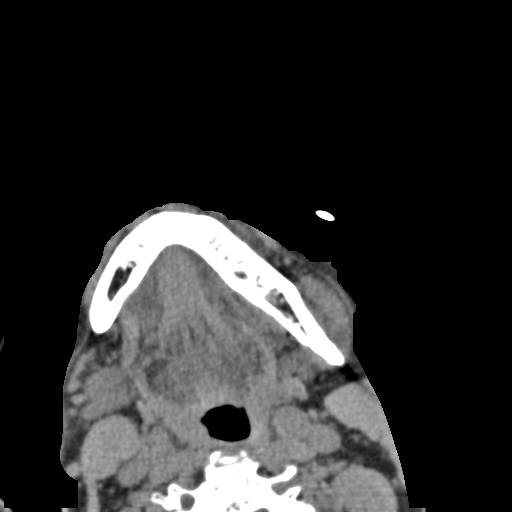
[im 99/197  soft-tissue]
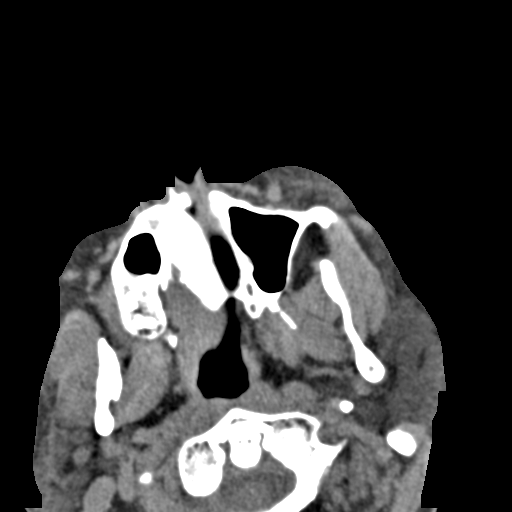
[im 148/197  soft-tissue]
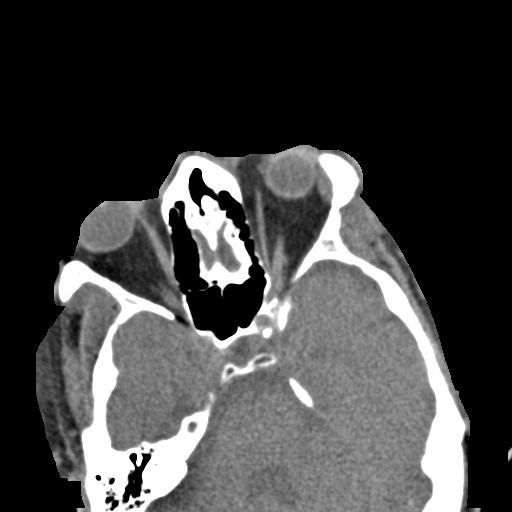

[Series 14: coronal bone · coronal · 0.38mm/px · 1 of 76 slices shown]
[im 75/76  bone]
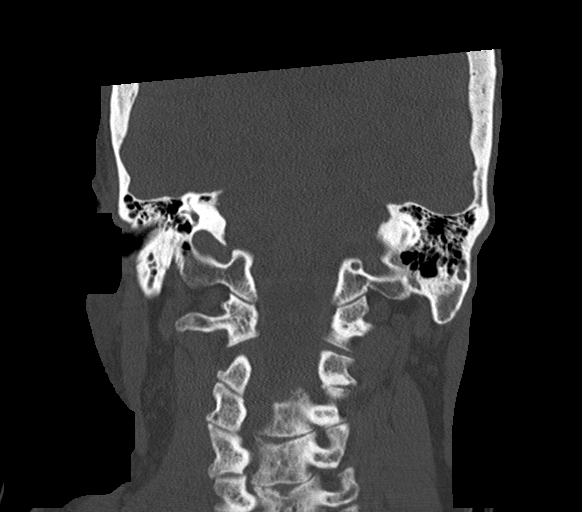

[Series 19: sagittal bone · sagittal · 0.23mm/px · 2 of 61 slices shown]
[im 21/61  bone]
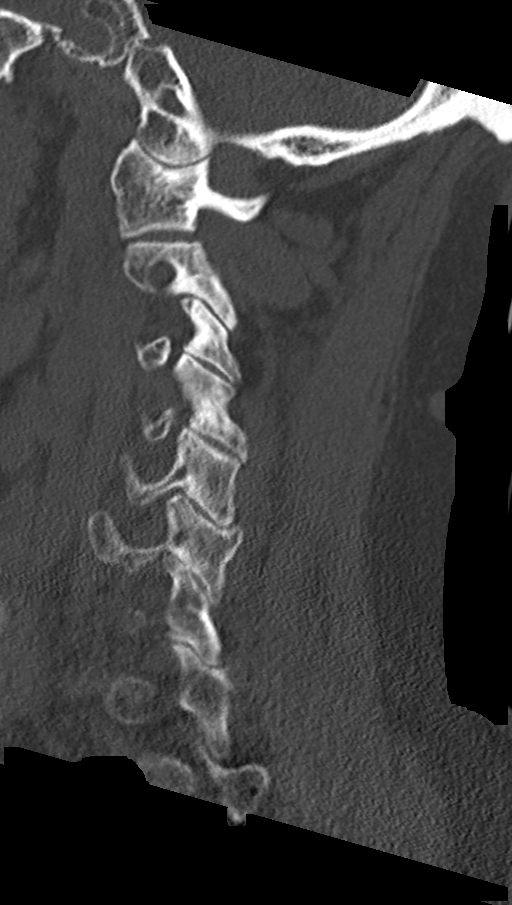
[im 41/61  bone]
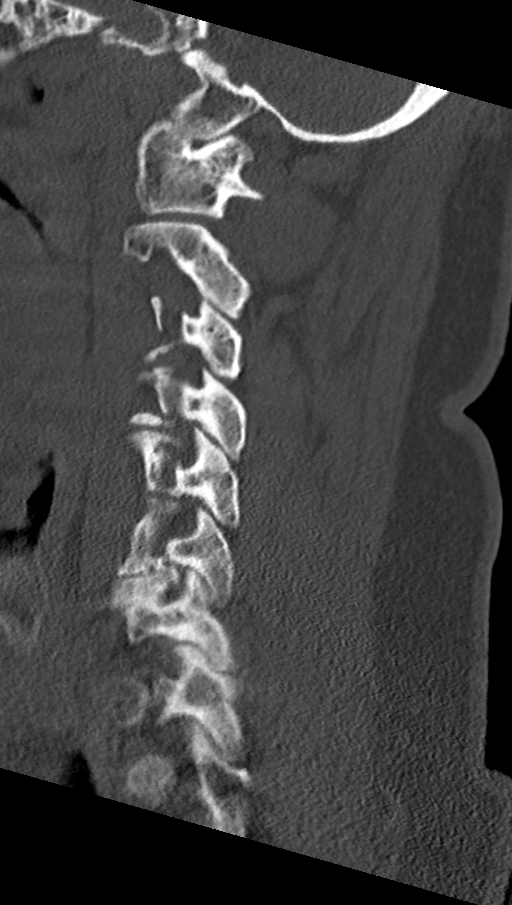

[9 of 33 positions shown; findings below may reference images not displayed]

FINDINGS: CT HEAD FINDINGS

Brain: There is mild age-related atrophy and chronic microvascular
ischemic changes. There is no acute intracranial hemorrhage. No mass
effect or midline shift. No extra-axial fluid collection.

Vascular: No hyperdense vessel or unexpected calcification.

Skull: Normal. Negative for fracture or focal lesion.

Other: None

CT MAXILLOFACIAL FINDINGS

Osseous: There are angulated and depressed fractures of the nasal
bone, likely acute or subacute. There is fracture of the bony nasal
septum. There is perforation of the cartilaginous nasal septum which
can be seen in the setting of substance inhalation. No other acute
fracture identified. No mandibular subluxation.

Orbits: The globes and retro-orbital fat are preserved.

Sinuses: Small right maxillary sinus retention cyst or polyp. There
with a of the visualized paranasal sinuses are clear.

Soft tissues: Left periorbital and facial soft tissue swelling and
small hematoma.

CT CERVICAL SPINE FINDINGS

Alignment: No acute subluxation. There is straightening of normal
cervical lordosis which may be positional or due to muscle spasm or
secondary to degenerative changes.

Skull base and vertebrae: No acute fracture.

Soft tissues and spinal canal: No prevertebral fluid or swelling. No
visible canal hematoma.

Disc levels: Multilevel degenerative changes with endplate
irregularity and disc space narrowing.

Upper chest: Negative.

Other: None
IMPRESSION: 1. No acute intracranial hemorrhage.
2. No acute/traumatic cervical spine pathology.
3. Fractures of the nasal bone and bony nasal septum.

## 2019-10-01 IMAGING — CT CT HEAD W/O CM
4 series · 16 of 47 positions shown, 18 images · non-contrast
Comparison: Multiple prior exams most recent head CT 3 days ago.

CLINICAL DATA: Fall out of bed. Laceration above right eye.
Frequent falls.

EXAM:
CT HEAD WITHOUT CONTRAST
TECHNIQUE: Contiguous axial images were obtained from the base of the skull
through the vertex without intravenous contrast.

[Series 3: head without · axial · non-contrast · 0.51mm/px · z∈[+1063,+1188]mm · 7 of 35 slices shown, 9 images]
[im 5/35  brain]
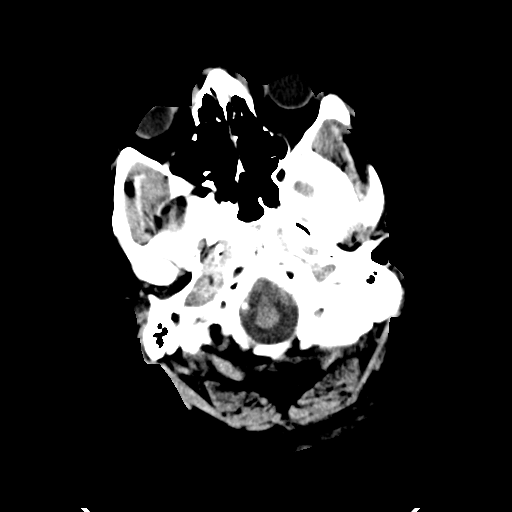
[im 5/35  bone]
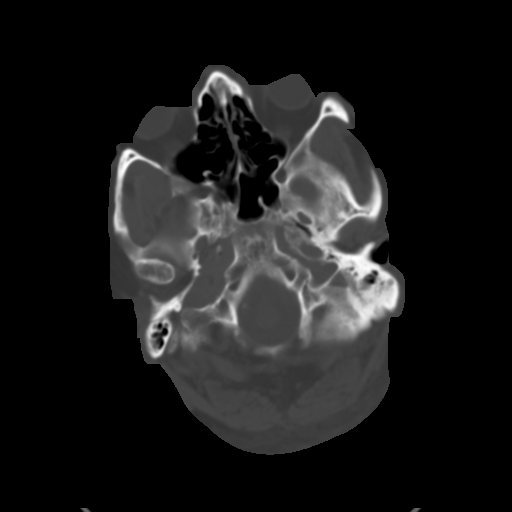
[im 9/35  brain]
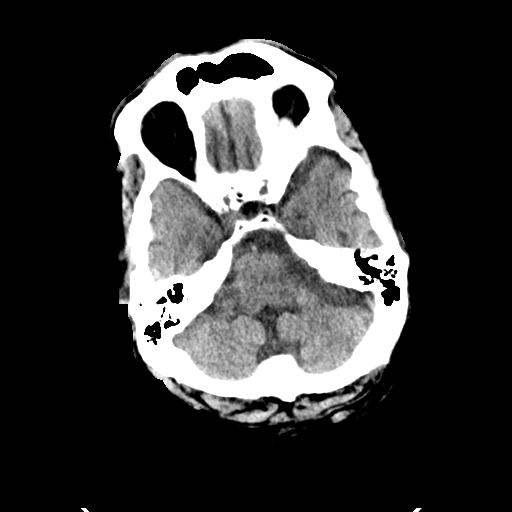
[im 13/35  brain]
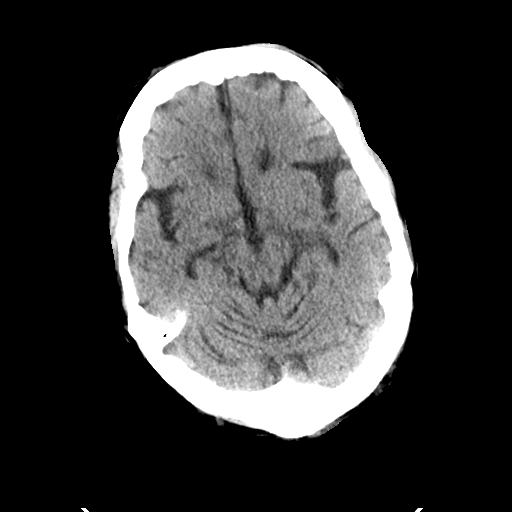
[im 18/35  brain]
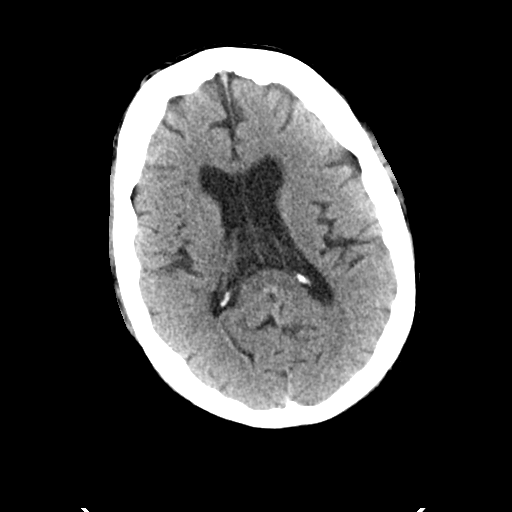
[im 22/35  brain]
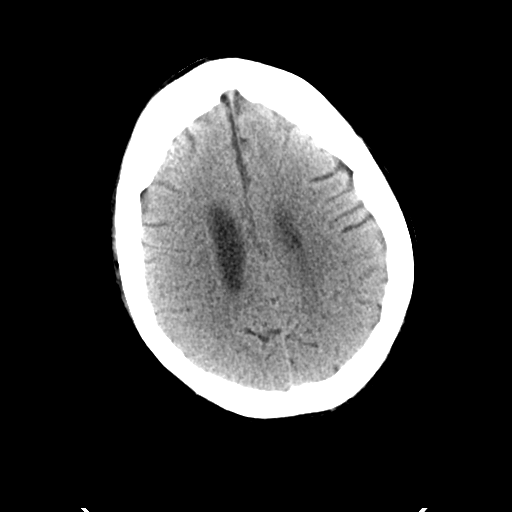
[im 22/35  bone]
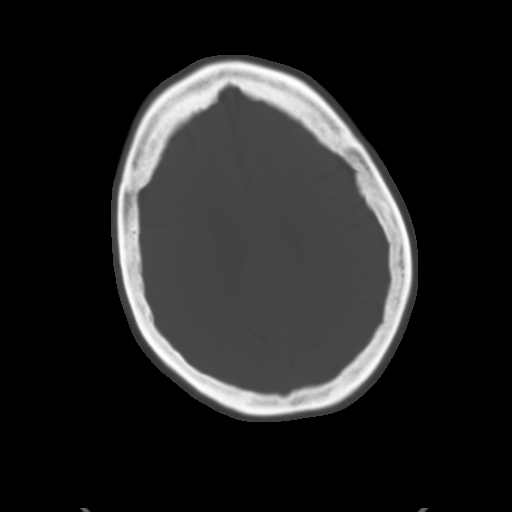
[im 26/35  brain]
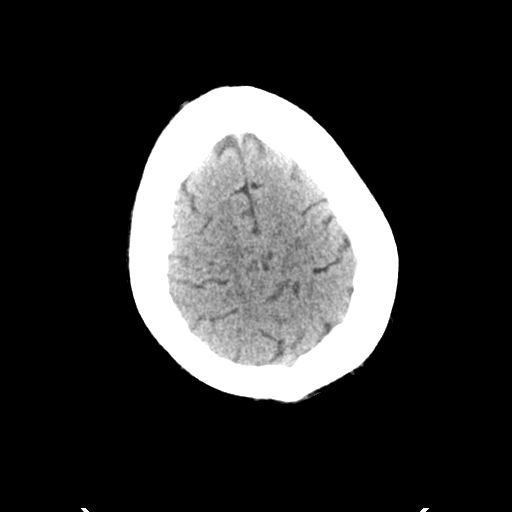
[im 30/35  brain]
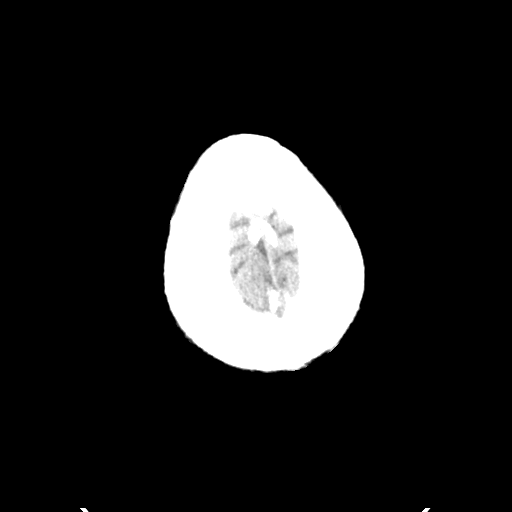

[Series 4: head bone · axial · 0.51mm/px · z∈[+1059,+1095]mm · 3 of 88 slices shown]
[im 9/88  bone]
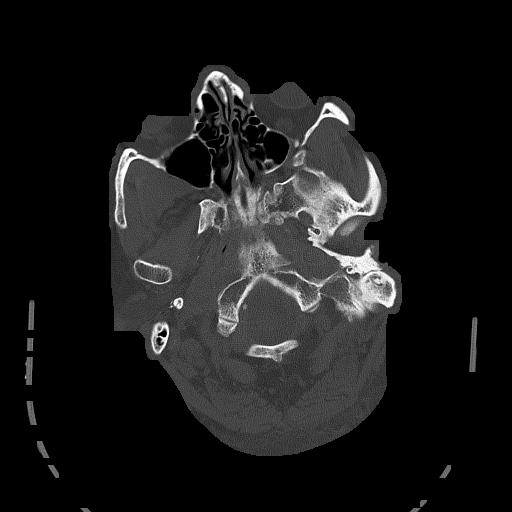
[im 18/88  bone]
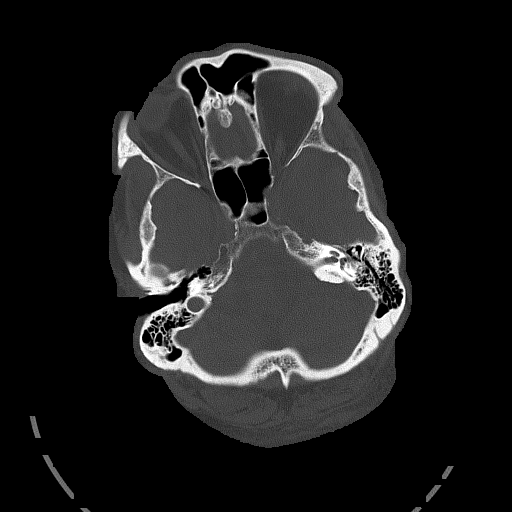
[im 27/88  bone]
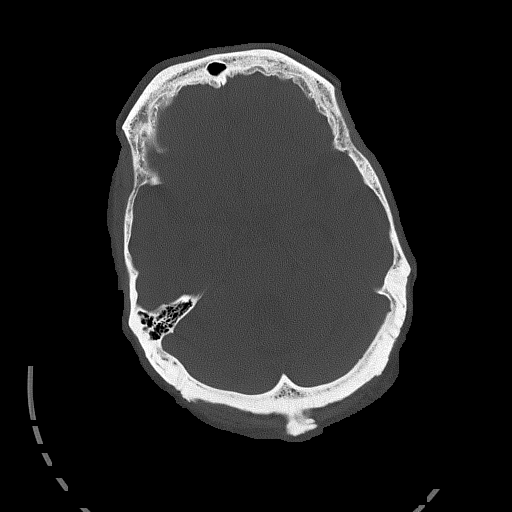

[Series 5: head without cor · coronal · non-contrast · 0.35mm/px · 3 of 74 slices shown]
[im 25/74  brain]
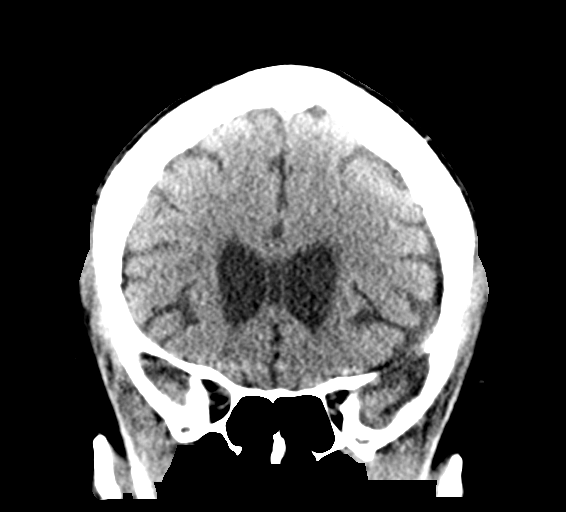
[im 33/74  brain]
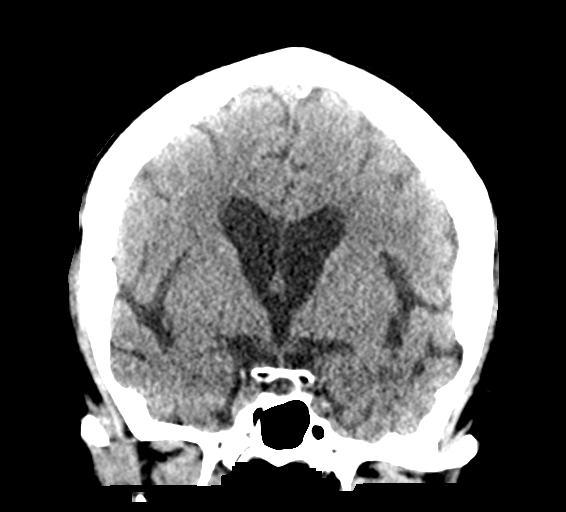
[im 41/74  brain]
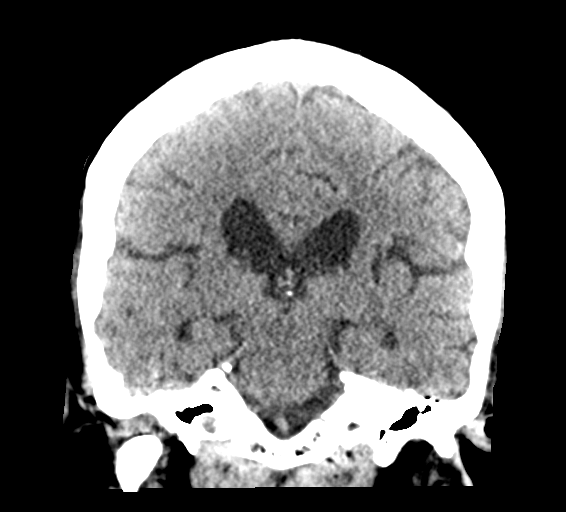

[Series 6: head without sag · sagittal · non-contrast · 0.35mm/px · 3 of 61 slices shown]
[im 21/61  brain]
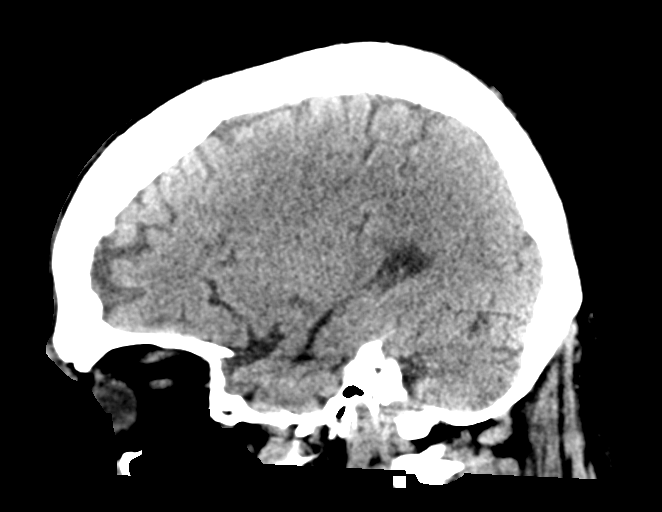
[im 31/61  brain]
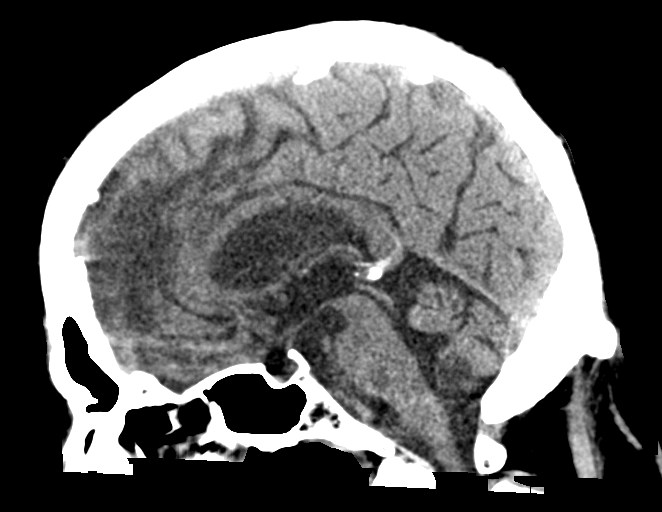
[im 41/61  brain]
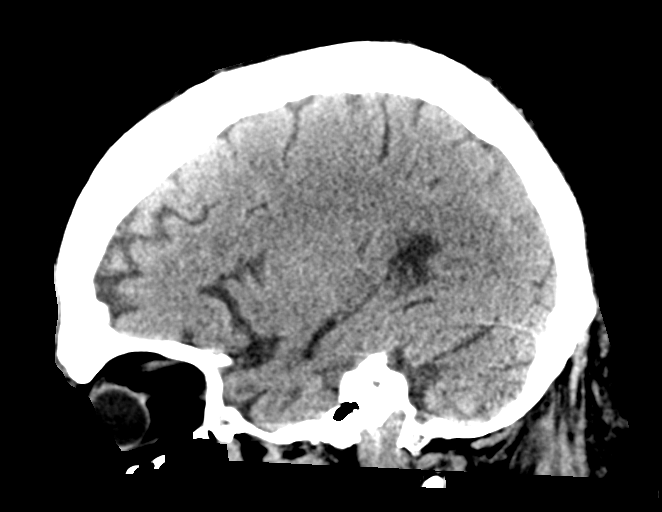

[16 of 47 positions shown; findings below may reference images not displayed]

FINDINGS: Brain: No evidence of acute infarction, hemorrhage, hydrocephalus,
extra-axial collection or mass lesion/mass effect. Unchanged atrophy
and chronic small vessel ischemia.

Vascular: Atherosclerosis of skullbase vasculature without
hyperdense vessel or abnormal calcification.

Skull: No fracture or focal lesion.

Sinuses/Orbits: Right supraorbital hematoma is unchanged from recent
exam.

Other: None.
IMPRESSION: No acute intracranial abnormality or skull fracture.

## 2019-12-04 ENCOUNTER — Emergency Department (HOSPITAL_COMMUNITY)
Admission: EM | Admit: 2019-12-04 | Discharge: 2019-12-04 | Disposition: A | Discharge status: Skilled Nursing Facility | Payer: Medicare Other | Attending: Emergency Medicine | Admitting: Emergency Medicine

## 2019-12-04 DIAGNOSIS — E039 Hypothyroidism, unspecified: Secondary | ICD-10-CM | POA: Insufficient documentation

## 2019-12-04 DIAGNOSIS — Y69 Unspecified misadventure during surgical and medical care: Secondary | ICD-10-CM | POA: Insufficient documentation

## 2019-12-04 DIAGNOSIS — K9423 Gastrostomy malfunction: Secondary | ICD-10-CM

## 2019-12-04 DIAGNOSIS — Z7982 Long term (current) use of aspirin: Secondary | ICD-10-CM | POA: Insufficient documentation

## 2019-12-04 DIAGNOSIS — T85528A Displacement of other gastrointestinal prosthetic devices, implants and grafts, initial encounter: Secondary | ICD-10-CM | POA: Insufficient documentation

## 2019-12-04 DIAGNOSIS — F0390 Unspecified dementia without behavioral disturbance: Secondary | ICD-10-CM | POA: Insufficient documentation

## 2019-12-04 DIAGNOSIS — Z91040 Latex allergy status: Secondary | ICD-10-CM | POA: Insufficient documentation

## 2019-12-04 DIAGNOSIS — Z79899 Other long term (current) drug therapy: Secondary | ICD-10-CM | POA: Diagnosis not present

## 2019-12-04 NOTE — ED Notes (Signed)
Report called to [facility], [facility]AR at bedside. Wife called and updated of status.

## 2019-12-04 NOTE — ED Triage Notes (Signed)
Arrives via EMS from [facility], he pulled out his G-tube this AM because it was hurting him, they replaced it with a foley. The area surrounding the g-tube is inflamed and warm to the touch, patient endorses abdominal pain around the site. EMS reports hypotension and altered mental status, patient awake and able to answer questions at this time.

## 2019-12-04 NOTE — ED Provider Notes (Signed)
Gastrostomy tube replacement  Date/Time: 12/04/2019 2:00 PM Performed by: Renita Papa, PA-C Authorized by: Renita Papa, PA-C  Consent: Verbal consent obtained. Risks and benefits: risks, benefits and alternatives were discussed Consent given by: patient Patient understanding: patient states understanding of the procedure being performed Patient consent: the patient's understanding of the procedure matches consent given Procedure consent: procedure consent matches procedure scheduled Relevant documents: relevant documents present and verified Test results: test results available and properly labeled Site marked: the operative site was marked Imaging studies: imaging studies available Required items: required blood products, implants, devices, and special equipment available Patient identity confirmed: arm band Time out: Immediately prior to procedure a "time out" was called to verify the correct patient, procedure, equipment, support staff and site/side marked as required. Preparation: Patient was prepped and draped in the usual sterile fashion. Local anesthesia used: no  Anesthesia: Local anesthesia used: no  Sedation: Patient sedated: no  Patient tolerance: patient tolerated the procedure well with no immediate complications       Renita Papa, PA-C 12/04/19 1400    Virgel Manifold, MD 12/05/19 [phone]

## 2019-12-04 NOTE — ED Notes (Signed)
PTAR called for transport back to [facility]. Attempted to call [facility] and notify them of patient's return, but no response.

## 2019-12-04 NOTE — ED Provider Notes (Signed)
[facility] DEPT Provider Note   CSN: [id number] Arrival date & time: 12/04/19  1224     History   Chief Complaint Chief Complaint  Patient presents with  . Abdominal Pain    HPI Prather Failla is a 53 y.o. male.     HPI 53 year old male presenting after his gastrostomy tube was dislodged.  Foley catheter was placed temporarily by his facility prior to him coming to the emergency room.  No other acute complaints reported.  History is limited by patient's ability to communicate although he does mouth some words.  He denies any acute pain to me.  Past Medical History:  Diagnosis Date  . Addison disease ([place])   . Anoxic brain injury ([place])   . Anxiety   . Aspiration pneumonia ([place])   . Atrial fibrillation ([place])   . Chronic constipation 11/23/2011  . Dementia ([place])   . Dysphagia   . Encephalopathy   . Glaucoma   . Glucocorticoid deficiency ([place])   . History of recurrent UTIs   . Hyperlipemia   . Hypothyroidism   . Mental disorder   . Myocardial infarction ([place])   . Pneumoperitoneum of unknown etiology 12/21/2011  . Quadriplegia ([place])   . Reflux   . Seizure disorder [facility])     Patient Active Problem List   Diagnosis Date Noted  . PEG Gastrostomy tube in place ([place]) 01/26/2018  . 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 01/26/2018 01/26/2018  . Bedridden 01/26/2018  . Quadriplegia ([place])   . Abdominal wall abscess 01/25/2018  . Fracture of humerus anatomical neck, right, closed, initial encounter 10/03/2017  . AKI (acute kidney injury) ([place]) 10/03/2017  . UTI (urinary tract infection) 10/03/2017  . Agitation 07/03/2014  . Cardiomyopathy, ischemic 06/18/2014  . 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 Acute psychosis ([place]) 04/19/2014  . Unspecified hypothyroidism 05/16/2013  . Constipation 05/16/2013  . Anemia of other chronic disease 05/16/2013  . Mental disorder 01/03/2013  . Addisons disease ([place]) 01/03/2013  . PEG  (percutaneous endoscopic gastrostomy) adjustment/replacement/removal ([place]) 12/21/2011  . Hypokalemia 11/25/2011  . Thrombocytopenia ([place]) 11/25/2011  . FTT (failure to thrive) in adult 11/25/2011  . Grand mal seizure ([place]) 11/23/2011  . Depression (emotion) 11/23/2011  . History of atrial fibrillation 11/23/2011  . Other specified hemorrhagic conditions ([place]) 11/23/2011  . Glaucoma 11/23/2011  . Hypoglycemia 11/23/2011  . Chronic constipation 11/23/2011  . Anoxic brain injury ([place]) 11/12/2011  . Protein calorie malnutrition ([place]) 11/12/2011  . Hyperchloremia 11/12/2011  . Hyperthyroidism 11/12/2011  . Acute lower UTI 11/12/2011  . Dysphagia     Past Surgical History:  Procedure Laterality Date  . DEBRIDEMENT OF ABDOMINAL WALL ABSCESS  01/26/2018  . HERNIA MESH REMOVAL  01/26/2018   Infected mesh - removed w I&D abd wall abscess  . IR GENERIC HISTORICAL  10/20/2016   IR [place] TUBE PERCUT W/FLUORO 10/20/2016 Arne Cleveland, MD [facility]INTER[facility] RAD  . IR REPLACE G-TUBE SIMPLE WO FLUORO  01/28/2018  . IR [place] TUBE PERCUT W/FLUORO  04/21/2017  . IR REPLC GASTRO/COLONIC TUBE PERCUT W/FLUORO  07/22/2017  . IR [place] PERCUT W/FLUORO  11/24/2017  . IRRIGATION AND DEBRIDEMENT ABSCESS N/A 01/26/2018   Procedure: IRRIGATION AND DEBRIDEMENT ABDOMINAL WALL ABSCESS WITH REMOVAL OF INFECTED MESH;  Surgeon: Michael Boston, MD;  Location: [facility] ORS;  Service: General;  Laterality: N/A;  . NEPHRECTOMY  unknown  . PEG TUBE PLACEMENT        Home Medications  Prior to Admission medications   Medication Sig Start Date End Date Taking? Authorizing Provider  acetaminophen (TYLENOL) 325 MG tablet Place 650 mg into feeding tube every 4 (four) hours as needed for mild pain.    [provider]  Amino Acids-Protein Hydrolys (FEEDING SUPPLEMENT, PRO-STAT SUGAR FREE 64,) LIQD Place 30 mLs into feeding tube 2 (two) times daily.     [provider]   aspirin 81 MG chewable tablet Chew 81 mg by mouth daily.    [provider]  Carboxymeth-Glycerin-Polysorb (REFRESH OPTIVE ADVANCED OP) Place 1 drop into both eyes 3 (three) times daily.    [provider]  divalproex (DEPAKOTE SPRINKLE) 125 MG capsule Take 375-500 mg by mouth See admin instructions. Take 3 capsules (375mg ) via g-tube every morning, then take 4 capsules (500mg ) via g-tube every evening. 07/18/13   [provider]  levothyroxine (SYNTHROID, LEVOTHROID) 150 MCG tablet Place 150 mcg into feeding tube daily before breakfast.     [provider]  metoprolol tartrate (LOPRESSOR) 25 mg/10 mL SUSP Take 12.5 mg by mouth 2 (two) times daily.    [provider]  Multiple Vitamins-Minerals (CERTAVITE/ANTIOXIDANTS) LIQD 15 mLs by PEG Tube route daily at 12 noon.     [provider]  pantoprazole sodium (PROTONIX) 40 mg/20 mL PACK 40 mg by PEG Tube route daily at 12 noon.  11/30/11   07/20/13, MD  PARoxetine (PAXIL) 20 MG tablet Place 20 mg into feeding tube daily.  06/03/17   [provider]  polyethylene glycol (MIRALAX / GLYCOLAX) packet 17 g by PEG Tube route 2 (two) times daily. Mix 17gm  In 4-8 ounces of liquid    [provider]  risperiDONE (RISPERDAL M-TABS) 1 MG disintegrating tablet Place 1 mg under the tongue 3 (three) times daily.  05/08/17   [provider]  sennosides (SENOKOT) 8.8 MG/5ML syrup Place 15 mLs into feeding tube 2 (two) times daily.     [provider]  traZODone (DESYREL) 150 MG tablet Take 75 mg by mouth at bedtime.  08/02/13   [provider]    Family History No family history on file.  Social History Social History   Tobacco Use  . Smoking status: Never Smoker  . Smokeless tobacco: Never Used  Substance Use Topics  . Alcohol use: No  . Drug use: No     Allergies   Ativan [lorazepam], Latex, Morphine and related, Penicillins, Pyridium [phenazopyridine  hcl], and Soy allergy   Review of Systems Review of Systems  All systems reviewed and negative, other than as noted in HPI.  Physical Exam Updated Vital Signs BP (!) 102/55   Pulse 70   Temp 98.1 F (36.7 C) (Oral)   Resp 16   SpO2 95%   Physical Exam Vitals signs and nursing note reviewed.  Constitutional:      General: He is not in acute distress.    Appearance: He is well-developed.  HENT:     Head: Normocephalic and atraumatic.  Eyes:     General:        Right eye: No discharge.        Left eye: No discharge.     Conjunctiva/sclera: Conjunctivae normal.  Neck:     Musculoskeletal: Neck supple.  Cardiovascular:     Rate and Rhythm: Normal rate and regular rhythm.     Heart sounds: Normal heart sounds. No murmur. No friction rub. No gallop.   Pulmonary:  Effort: Pulmonary effort is normal. No respiratory distress.     Breath sounds: Normal breath sounds.  Abdominal:     General: There is no distension.     Palpations: Abdomen is soft.     Tenderness: There is no abdominal tenderness.     Comments: Gastrostomy with 5718 French Foley catheter in place.  Chronic appearing skin changes surrounding it.  Musculoskeletal:        General: No tenderness.  Skin:    General: Skin is warm and dry.  Neurological:     Mental Status: He is alert.  Psychiatric:        Behavior: Behavior normal.        Thought Content: Thought content normal.    ED Treatments / Results  Labs (all labs ordered are listed, but only abnormal results are displayed) Labs Reviewed - No data to display  EKG None  Radiology No results found.  Procedures Procedures (including critical care time)  Medications Ordered in ED Medications - No data to display   Initial Impression / Assessment and Plan / ED Course  I have reviewed the triage vital signs and the nursing notes.  Pertinent labs & imaging results that were available during my care of the patient were reviewed by me and  considered in my medical decision making (see chart for details).   53 year old male presenting after gastrostomy tube was dislodged.  It was placed with a 24 [place] gastrostomy tube as this was mentioned on prior replacement notes.  He tolerated well without any apparent issues.  Gastric contents aspirated via syringe to confirm placement.  Final Clinical Impressions(s) / ED Diagnoses   Final diagnoses:  Gastrostomy tube dysfunction [facility](HCC)    ED Discharge Orders    None       Raeford RazorKohut, Tziporah Knoke, MD 12/04/19 1421

## 2019-12-05 ENCOUNTER — Emergency Department (HOSPITAL_COMMUNITY): Payer: Medicare Other

## 2019-12-05 ENCOUNTER — Emergency Department (HOSPITAL_COMMUNITY)
Admission: EM | Admit: 2019-12-05 | Discharge: 2019-12-05 | Disposition: A | Discharge status: Home / Self Care | Payer: Medicare Other | Attending: Emergency Medicine | Admitting: Emergency Medicine

## 2019-12-05 ENCOUNTER — Encounter (HOSPITAL_COMMUNITY): Payer: Self-pay

## 2019-12-05 DIAGNOSIS — S0003XA Contusion of scalp, initial encounter: Secondary | ICD-10-CM | POA: Insufficient documentation

## 2019-12-05 DIAGNOSIS — F0390 Unspecified dementia without behavioral disturbance: Secondary | ICD-10-CM | POA: Diagnosis not present

## 2019-12-05 DIAGNOSIS — W19XXXA Unspecified fall, initial encounter: Secondary | ICD-10-CM | POA: Diagnosis not present

## 2019-12-05 DIAGNOSIS — Z91040 Latex allergy status: Secondary | ICD-10-CM | POA: Diagnosis not present

## 2019-12-05 DIAGNOSIS — Y999 Unspecified external cause status: Secondary | ICD-10-CM | POA: Diagnosis not present

## 2019-12-05 DIAGNOSIS — Z79899 Other long term (current) drug therapy: Secondary | ICD-10-CM | POA: Insufficient documentation

## 2019-12-05 DIAGNOSIS — E039 Hypothyroidism, unspecified: Secondary | ICD-10-CM | POA: Insufficient documentation

## 2019-12-05 DIAGNOSIS — M25512 Pain in left shoulder: Secondary | ICD-10-CM | POA: Diagnosis not present

## 2019-12-05 DIAGNOSIS — Y92129 Unspecified place in nursing home as the place of occurrence of the external cause: Secondary | ICD-10-CM | POA: Diagnosis not present

## 2019-12-05 DIAGNOSIS — I252 Old myocardial infarction: Secondary | ICD-10-CM | POA: Diagnosis not present

## 2019-12-05 DIAGNOSIS — Y939 Activity, unspecified: Secondary | ICD-10-CM | POA: Diagnosis not present

## 2019-12-05 DIAGNOSIS — Z7982 Long term (current) use of aspirin: Secondary | ICD-10-CM | POA: Insufficient documentation

## 2019-12-05 DIAGNOSIS — S0990XA Unspecified injury of head, initial encounter: Secondary | ICD-10-CM | POA: Diagnosis present

## 2019-12-05 NOTE — ED Provider Notes (Signed)
[facility]   CSN: [id number] Arrival date & time: 12/05/19  [id number]     History   Chief Complaint Chief Complaint  Patient presents with   Fall    HPI [name] is a 53 y.o. male.     [facility]  History of dementia.  Level 5 caveat due to dementia.  Patient had an unwitnessed fall this morning.  Patient's roommate alerted the staff.  Patient hit the back of his head.  Apparently he crawled back in the bed.  Reportedly at his baseline.  EMS reported complaint of left shoulder pain as well.  Reviewed patient's med list.  No anticoagulation.     Past Medical History:  Diagnosis Date   Addison disease (HCC)    Anoxic brain injury (HCC)    Anxiety    Aspiration pneumonia (HCC)    Atrial fibrillation (HCC)    Chronic constipation 11/23/2011   Dementia (HCC)    Dysphagia    Encephalopathy    Glaucoma    Glucocorticoid deficiency (HCC)    History of recurrent UTIs    Hyperlipemia    Hypothyroidism    Mental disorder    Myocardial infarction (HCC)    Pneumoperitoneum of unknown etiology 12/21/2011   Quadriplegia (HCC)    Reflux    Seizure disorder [facility](HCC)     Patient Active Problem List   Diagnosis Date Noted   PEG Gastrostomy tube in place (HCC) 01/26/2018   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 01/26/2018 01/26/2018   Bedridden 01/26/2018   Quadriplegia (HCC)    Abdominal wall abscess 01/25/2018   Fracture of humerus anatomical neck, right, closed, initial encounter 10/03/2017   AKI (acute kidney injury) (HCC) 10/03/2017   UTI (urinary tract infection) 10/03/2017   Agitation 07/03/2014   Cardiomyopathy, ischemic 06/18/2014   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   Acute psychosis (HCC) 04/19/2014   Unspecified hypothyroidism 05/16/2013   Constipation 05/16/2013   Anemia of other chronic disease 05/16/2013   Mental disorder 01/03/2013   Addisons  disease (HCC) 01/03/2013   PEG (percutaneous endoscopic gastrostomy) adjustment/replacement/removal (HCC) 12/21/2011   Hypokalemia 11/25/2011   Thrombocytopenia (HCC) 11/25/2011   FTT (failure to thrive) in adult 11/25/2011   Grand mal seizure (HCC) 11/23/2011   Depression (emotion) 11/23/2011   History of atrial fibrillation 11/23/2011   Other specified hemorrhagic conditions (HCC) 11/23/2011   Glaucoma 11/23/2011   Hypoglycemia 11/23/2011   Chronic constipation 11/23/2011   Anoxic brain injury (HCC) 11/12/2011   Protein calorie malnutrition (HCC) 11/12/2011   Hyperchloremia 11/12/2011   Hyperthyroidism 11/12/2011   Acute lower UTI 11/12/2011   Dysphagia     Past Surgical History:  Procedure Laterality Date   DEBRIDEMENT OF ABDOMINAL WALL ABSCESS  01/26/2018   HERNIA MESH REMOVAL  01/26/2018   Infected mesh - removed w I&D abd wall abscess   IR GENERIC HISTORICAL  10/20/2016   IR REPLC GASTRO/COLONIC TUBE PERCUT W/FLUORO 10/20/2016 Oley Balmaniel Hassell, MD [facility]INTER[facility] RAD   IR REPLACE G-TUBE SIMPLE WO FLUORO  01/28/2018   IR REPLC GASTRO/COLONIC TUBE PERCUT W/FLUORO  04/21/2017   IR REPLC GASTRO/COLONIC TUBE PERCUT W/FLUORO  07/22/2017   IR REPLC GASTRO/COLONIC TUBE PERCUT W/FLUORO  11/24/2017   IRRIGATION AND DEBRIDEMENT ABSCESS N/A 01/26/2018   Procedure: IRRIGATION AND DEBRIDEMENT ABDOMINAL WALL ABSCESS WITH REMOVAL OF INFECTED MESH;  Surgeon: Karie SodaGross, Steven, MD;  Location: [facility] ORS;  Service: General;  Laterality: N/A;   NEPHRECTOMY  unknown   PEG  TUBE PLACEMENT          Home Medications    Prior to Admission medications   Medication Sig Start Date End Date Taking? Authorizing Provider  acetaminophen (TYLENOL) 325 MG tablet Place 650 mg into feeding tube every 4 (four) hours as needed for mild pain.    [provider]  Amino Acids-Protein Hydrolys (FEEDING SUPPLEMENT, PRO-STAT SUGAR FREE 64,) LIQD Place 30 mLs into feeding tube 2 (two) times  daily.     [provider]  aspirin 81 MG chewable tablet Chew 81 mg by mouth daily.    [provider]  Carboxymeth-Glycerin-Polysorb (REFRESH OPTIVE ADVANCED OP) Place 1 drop into both eyes 3 (three) times daily.    [provider]  divalproex (DEPAKOTE SPRINKLE) 125 MG capsule Take 375-500 mg by mouth See admin instructions. Take 3 capsules (375mg ) via g-tube every morning, then take 4 capsules (500mg ) via g-tube every evening. 07/18/13   [provider]  levothyroxine (SYNTHROID, LEVOTHROID) 150 MCG tablet Place 150 mcg into feeding tube daily before breakfast.     [provider]  metoprolol tartrate (LOPRESSOR) 25 mg/10 mL SUSP Take 12.5 mg by mouth 2 (two) times daily.    [provider]  Multiple Vitamins-Minerals (CERTAVITE/ANTIOXIDANTS) LIQD 15 mLs by PEG Tube route daily at 12 noon.     [provider]  pantoprazole sodium (PROTONIX) 40 mg/20 mL PACK 40 mg by PEG Tube route daily at 12 noon.  11/30/11   Ernest Haber, MD  PARoxetine (PAXIL) 20 MG tablet Place 20 mg into feeding tube daily.  06/03/17   [provider]  polyethylene glycol (MIRALAX / GLYCOLAX) packet 17 g by PEG Tube route 2 (two) times daily. Mix 17gm  In 4-8 ounces of liquid    [provider]  risperiDONE (RISPERDAL M-TABS) 1 MG disintegrating tablet Place 1 mg under the tongue 3 (three) times daily.  05/08/17   [provider]  sennosides (SENOKOT) 8.8 MG/5ML syrup Place 15 mLs into feeding tube 2 (two) times daily.     [provider]  traZODone (DESYREL) 150 MG tablet Take 75 mg by mouth at bedtime.  08/02/13   [provider]    Family History No family history on file.  Social History Social History   Tobacco Use   Smoking status: Never Smoker   Smokeless tobacco: Never Used  Substance Use Topics   Alcohol use: No   Drug use: No     Allergies   Ativan [lorazepam], Latex, Morphine and related,  Penicillins, Pyridium [phenazopyridine hcl], and Soy allergy   Review of Systems Review of Systems  Unable to perform ROS: Dementia     Physical Exam Updated Vital Signs BP 104/80    Pulse 73    Temp 98.1 F (36.7 C) (Oral)    Resp 16    SpO2 96%   Physical Exam Vitals signs and nursing note reviewed.  Constitutional:      Appearance: He is well-developed.  HENT:     Head: Normocephalic. No raccoon eyes or Battle's sign.     Comments: Patient with a 2 cm contusion with dried blood noted to the occipital area of his scalp.  There is an overlying abrasion but no deep laceration.    Right Ear: Tympanic membrane, ear canal and external ear normal. No hemotympanum.     Left Ear: Tympanic membrane, ear canal and external ear normal. No hemotympanum.     Nose: Nose normal.  Eyes:  General: Lids are normal.     Conjunctiva/sclera: Conjunctivae normal.     Pupils: Pupils are equal, round, and reactive to light.     Comments: No visible hyphema  Neck:     Musculoskeletal: Normal range of motion and neck supple.  Cardiovascular:     Rate and Rhythm: Normal rate and regular rhythm.  Pulmonary:     Effort: Pulmonary effort is normal.     Breath sounds: Normal breath sounds.  Abdominal:     Palpations: Abdomen is soft.     Tenderness: There is no abdominal tenderness.     Comments: G-tube noted in place.  Musculoskeletal: Normal range of motion.     Cervical back: He exhibits normal range of motion, no tenderness and no bony tenderness.     Thoracic back: He exhibits no tenderness and no bony tenderness.     Lumbar back: He exhibits no tenderness and no bony tenderness.     Comments: Moves all extremities without discomfort.  Skin:    General: Skin is warm and dry.  Neurological:     Mental Status: He is alert.     GCS: GCS eye subscore is 4. GCS verbal subscore is 5. GCS motor subscore is 6.     Deep Tendon Reflexes: Reflexes are normal and symmetric.     Comments: Patient  follows basic commands.  He lifts both arms without discomfort.  He sticks out his tongue.  He does not give a verbal history.      ED Treatments / Results  Labs (all labs ordered are listed, but only abnormal results are displayed) Labs Reviewed - No data to display  EKG None  Radiology No results found.  Procedures Procedures (including critical care time)  Medications Ordered in ED Medications - No data to display   Initial Impression / Assessment and Plan / ED Course  I have reviewed the triage vital signs and the nursing notes.  Pertinent labs & imaging results that were available during my care of the patient were reviewed by me and considered in my medical decision making (see chart for details).        Patient seen and examined.  Patient with dementia, unable to give specific history, presents after a fall.  He has trauma to the occipital scalp.  Head CT and cervical spine imaging ordered.  Patient is moving all extremities.  Reported left shoulder pain however he does seem to have good range of motion of the shoulder with assistance without significant pain.  Will obtain x-ray but low concern overall for fracture.  Discussed patient with Dr. Juleen China.   Vital signs reviewed and are as follows: BP 104/80    Pulse 73    Temp 98.1 F (36.7 C) (Oral)    Resp 16    SpO2 96%   12:44 PM imaging reassuring.  Patient seems to be at his baseline.  Will discharge back to facility.  Final Clinical Impressions(s) / ED Diagnoses   Final diagnoses:  Contusion of scalp, initial encounter  Fall, initial encounter   Patient with unwitnessed fall today, scalp hematoma and abrasion.  Imaging of the head and cervical spine are negative.  Imaging of the shoulders are negative.  G-tube is in place today.    ED Discharge Orders    None       Renne Crigler, Cordelia Poche 12/05/19 1244    Raeford Razor, MD 12/05/19 1315

## 2019-12-05 NOTE — Discharge Instructions (Signed)
Please read and follow all provided instructions.  Your diagnoses today include:  1. Contusion of scalp, initial encounter   2. Fall, initial encounter     Tests performed today include:  CT scan of your head and cerivthat did not show any serious injury.  X-ray of the shoulders - did not show any broken bones  Vital signs. See below for your results today.   Medications prescribed:   None  Take any prescribed medications only as directed.  Home care instructions:  Follow any educational materials contained in this packet.  BE VERY CAREFUL not to take multiple medicines containing Tylenol (also called acetaminophen). Doing so can lead to an overdose which can damage your liver and cause liver failure and possibly death.   Follow-up instructions: Please follow-up with your primary care provider in the next 3 days for further evaluation of your symptoms if not improved.   Return instructions:  SEEK IMMEDIATE MEDICAL ATTENTION IF:  There is confusion or drowsiness (although children frequently become drowsy after injury).   You cannot awaken the injured person.   You have more than one episode of vomiting.   You notice dizziness or unsteadiness which is getting worse, or inability to walk.   You have convulsions or unconsciousness.   You experience severe, persistent headaches not relieved by Tylenol.  You cannot use arms or legs normally.   There are changes in pupil sizes. (This is the black center in the colored part of the eye)   There is clear or bloody discharge from the nose or ears.   You have change in speech, vision, swallowing, or understanding.   Localized weakness, numbness, tingling, or change in bowel or bladder control.  You have any other emergent concerns.  Additional Information: You have had a head injury which does not appear to require admission at this time.  Your vital signs today were: BP 104/80    Pulse 73    Temp 98.1 F (36.7 C)  (Oral)    Resp 16    SpO2 96%  If your blood pressure (BP) was elevated above 135/85 this visit, please have this repeated by your doctor within one month. --------------

## 2019-12-05 NOTE — ED Notes (Signed)
Attempted to call report to [facility], phone continued to ring nonstop after being transferred by Network engineer.

## 2019-12-05 NOTE — ED Notes (Signed)
Called [facility] for transport.  

## 2019-12-05 NOTE — ED Triage Notes (Signed)
Pt BIBA from [facility]. Pt's roommate hit call bell in room at 0840, stating that pt fell. Roommate stated he hit his head. Dried blood noted on back of head. Pt got himself back in bed. Pt also c/o left shoulder pain. Pt has hx of dementia.   100/70 HR 80 RR  18 96% FSBG 102

## 2020-01-07 IMAGING — CT CT CERVICAL SPINE W/O CM
3 of 4 series · 13 of 33 positions shown, 16 images · non-contrast
Comparison: Head CT 08/29/2019.

CLINICAL DATA: 53-year-old male with history of minor head trauma
from a fall.

EXAM:
CT HEAD WITHOUT CONTRAST
CT CERVICAL SPINE WITHOUT CONTRAST
TECHNIQUE: Multidetector CT imaging of the head and cervical spine was
performed following the standard protocol without intravenous
contrast. Multiplanar CT image reconstructions of the cervical spine
were also generated.

[Series 4: orthogonal bone · axial · 0.23mm/px · z∈[-298,-153]mm · 5 of 107 slices shown, 7 images]
[im 16/107  soft-tissue]
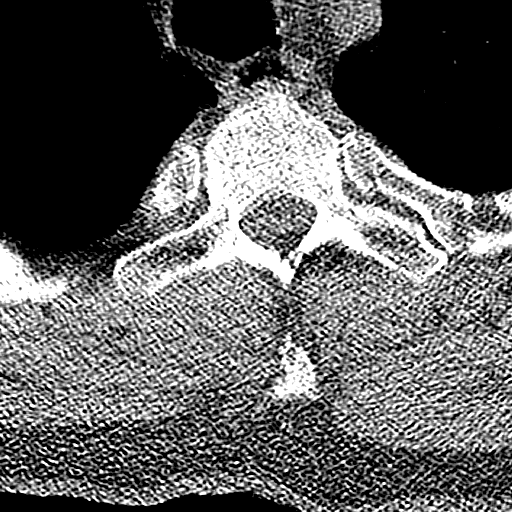
[im 16/107  bone]
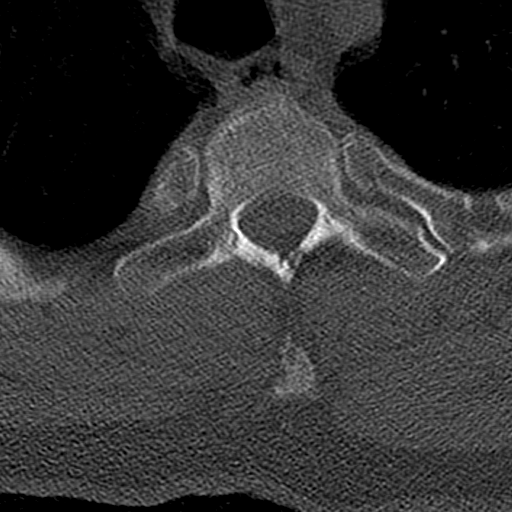
[im 31/107  bone]
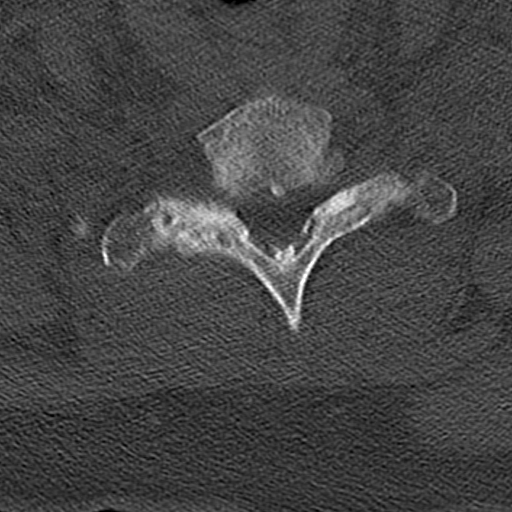
[im 61/107  bone]
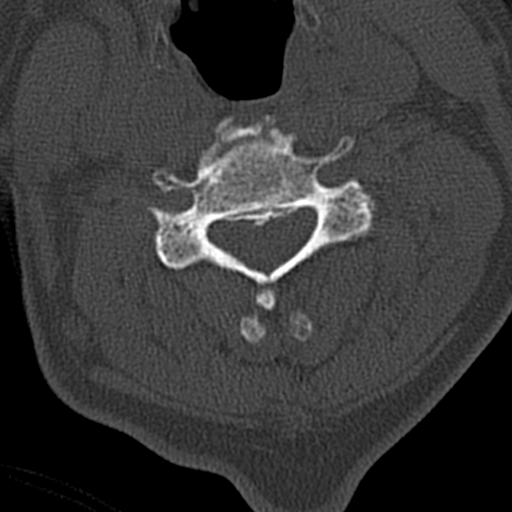
[im 76/107  bone]
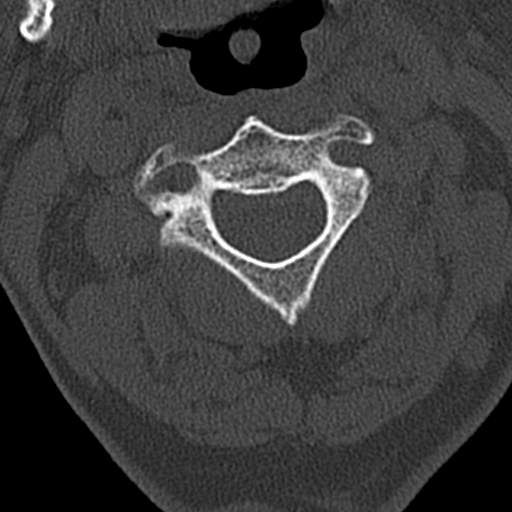
[im 91/107  soft-tissue]
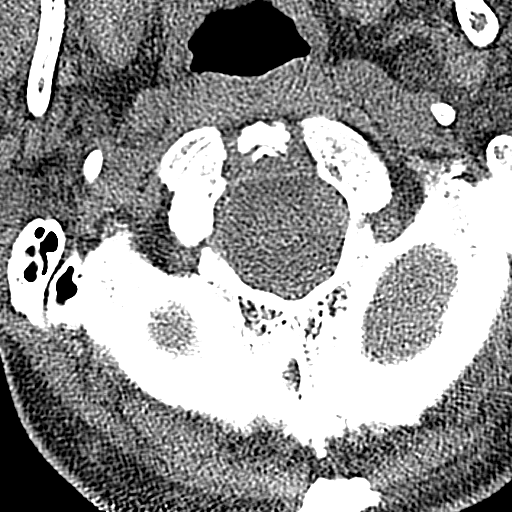
[im 91/107  bone]
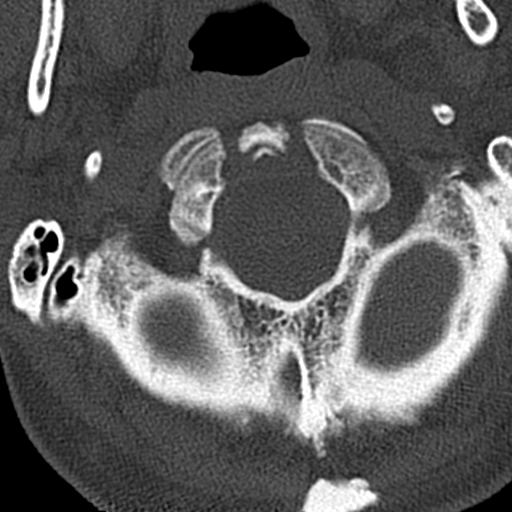

[Series 5: coronal bone · coronal · 0.32mm/px · 3 of 53 slices shown]
[im 11/53  bone]
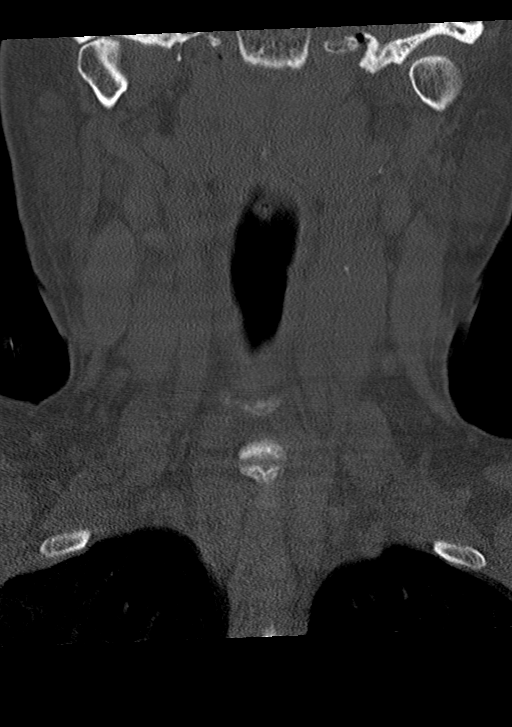
[im 21/53  bone]
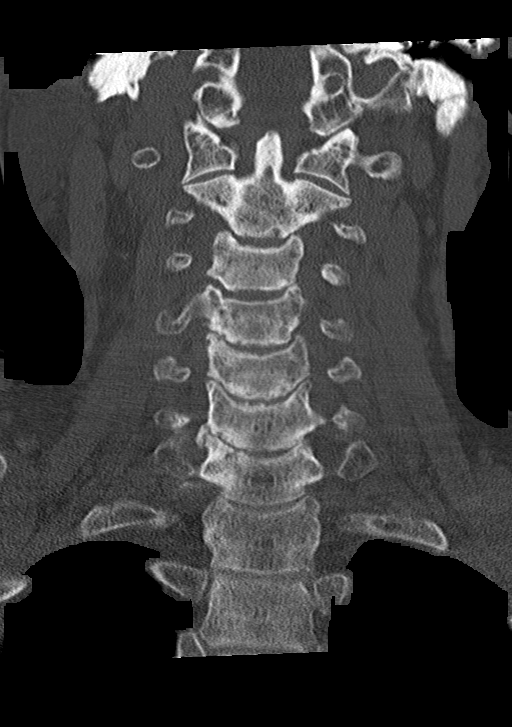
[im 32/53  bone]
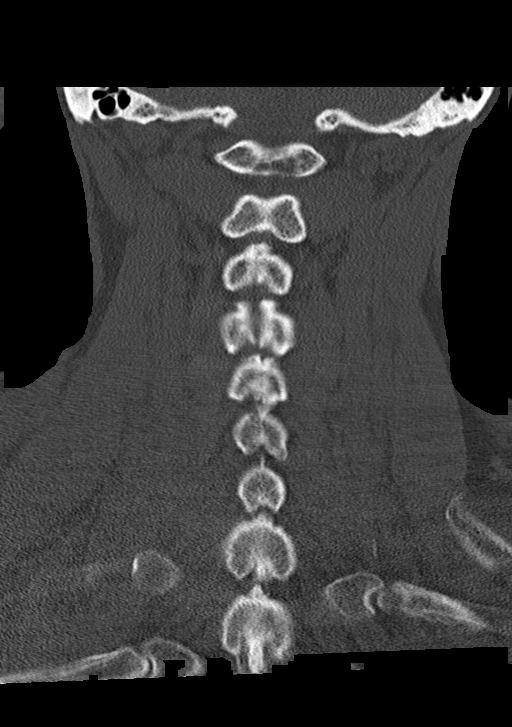

[Series 6: sagittal bone · sagittal · 0.33mm/px · 5 of 63 slices shown, 6 images]
[im 21/63  bone]
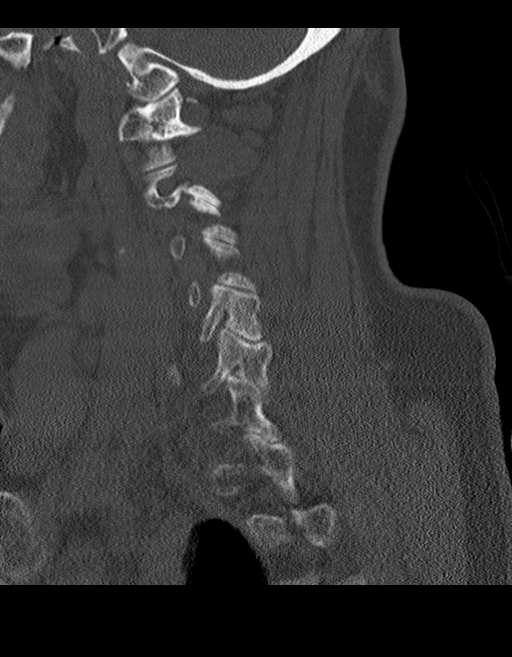
[im 26/63  bone]
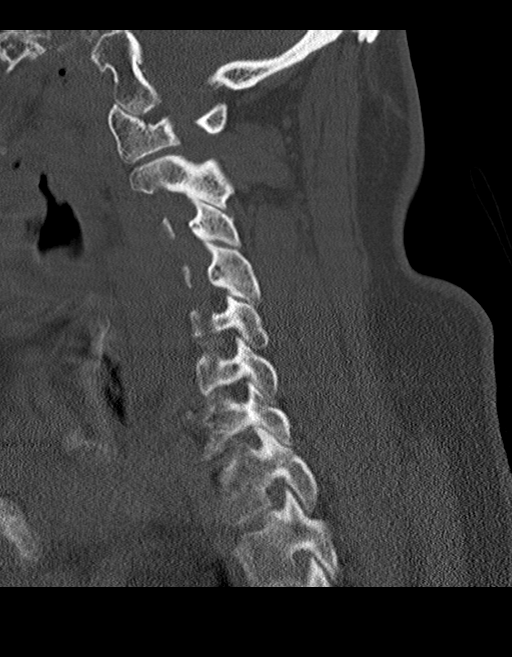
[im 32/63  soft-tissue]
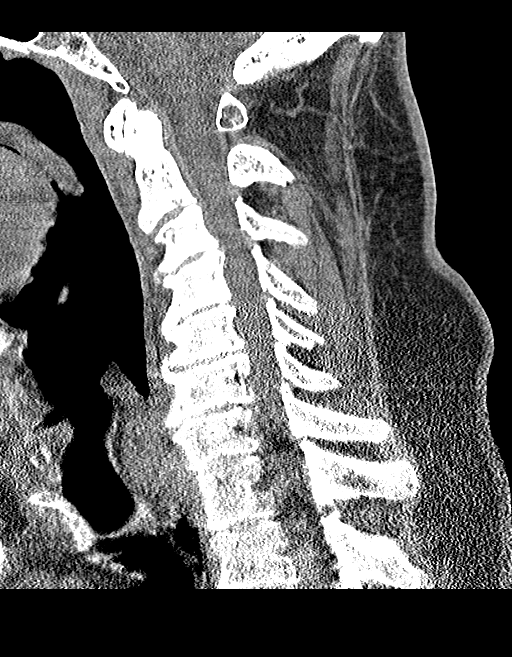
[im 32/63  bone]
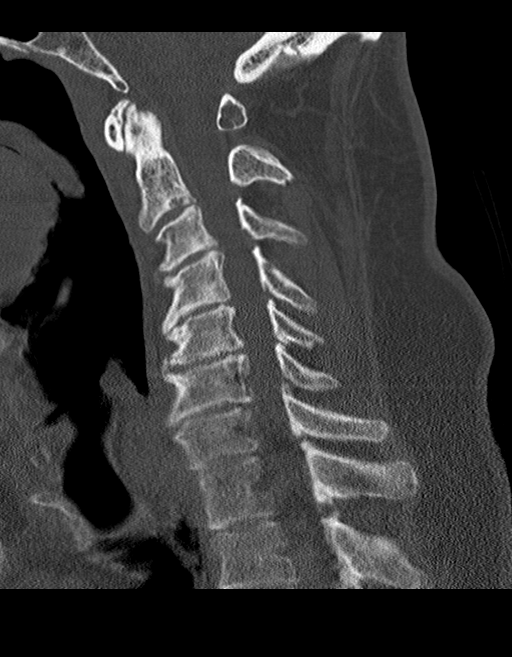
[im 37/63  bone]
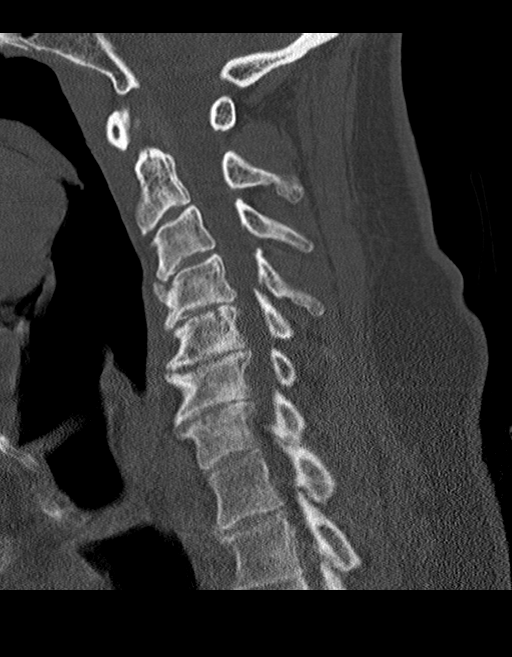
[im 42/63  bone]
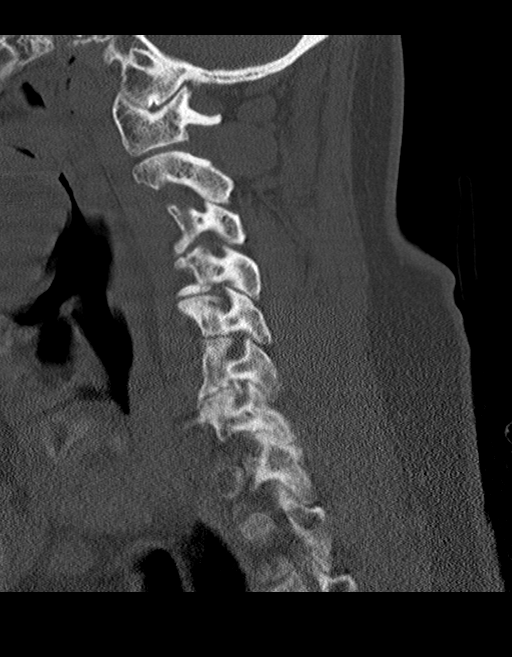

[13 of 33 positions shown; findings below may reference images not displayed]

FINDINGS: CT HEAD FINDINGS

Brain: Patchy areas of decreased attenuation are noted throughout
the deep and periventricular white matter of the cerebral
hemispheres bilaterally, compatible with mild chronic microvascular
ischemic disease. No evidence of acute infarction, hemorrhage,
hydrocephalus, extra-axial collection or mass lesion/mass effect.

Vascular: No hyperdense vessel or unexpected calcification.

Skull: Normal. Negative for fracture or focal lesion.

Sinuses/Orbits: No acute finding.

Other: None.

CT CERVICAL SPINE FINDINGS

Alignment: Mild reversal of normal cervical lordosis centered at the
level of C4-C5, likely chronic and degenerative. Alignment is
otherwise anatomic.

Skull base and vertebrae: No acute fracture. No primary bone lesion
or focal pathologic process.

Soft tissues and spinal canal: No prevertebral fluid or swelling. No
visible canal hematoma.

Disc levels: Multilevel degenerative disc disease, most severe at
C4-C5, C5-C6 and C6-C7. Mild multilevel facet arthropathy.

Upper chest: Unremarkable.

Other: None.
IMPRESSION: 1. No evidence of significant acute traumatic injury to the skull,
brain or cervical spine.
2. Very mild chronic microvascular ischemic changes in the cerebral
white matter, as above.
3. Multilevel degenerative disc disease and cervical spondylosis, as
above.

## 2020-01-07 IMAGING — CT CT HEAD W/O CM
3 of 6 series · 15 of 47 positions shown, 18 images · non-contrast
Comparison: Head CT 08/29/2019.

CLINICAL DATA: 53-year-old male with history of minor head trauma
from a fall.

EXAM:
CT HEAD WITHOUT CONTRAST
CT CERVICAL SPINE WITHOUT CONTRAST
TECHNIQUE: Multidetector CT imaging of the head and cervical spine was
performed following the standard protocol without intravenous
contrast. Multiplanar CT image reconstructions of the cervical spine
were also generated.

[Series 2: head wo · axial · 0.47mm/px · z∈[-122,+18]mm · 10 of 32 slices shown, 13 images]
[im 2/32  brain]
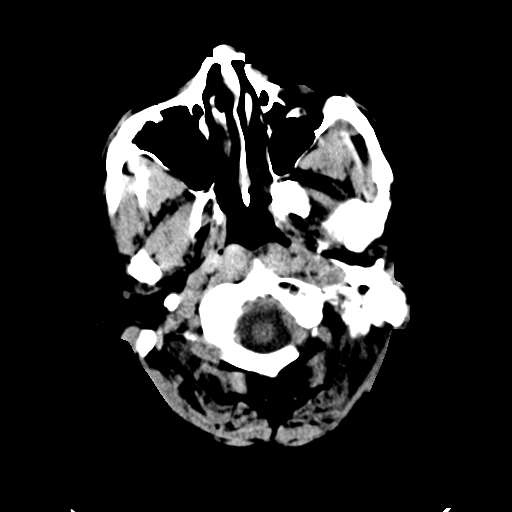
[im 2/32  bone]
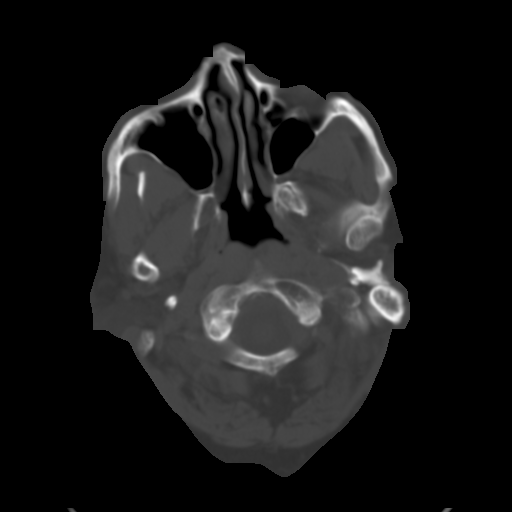
[im 6/32  brain]
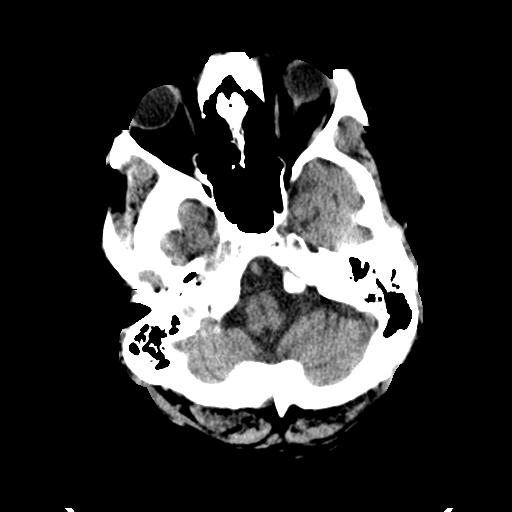
[im 10/32  brain]
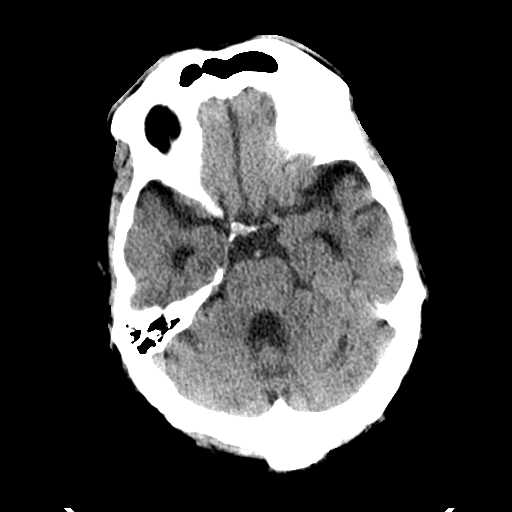
[im 11/32  brain]
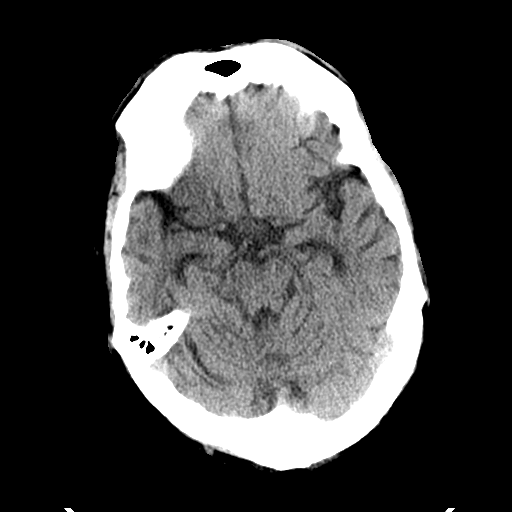
[im 15/32  brain]
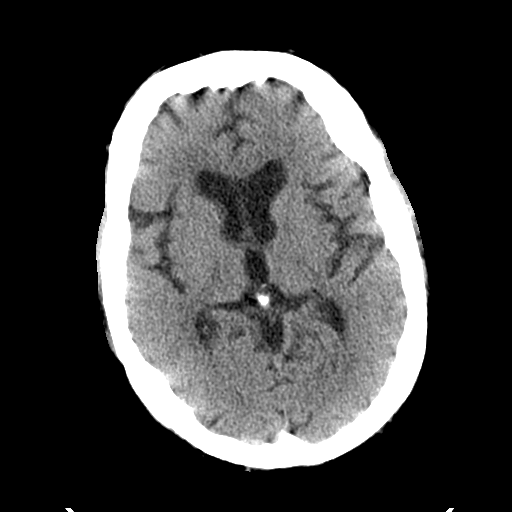
[im 15/32  bone]
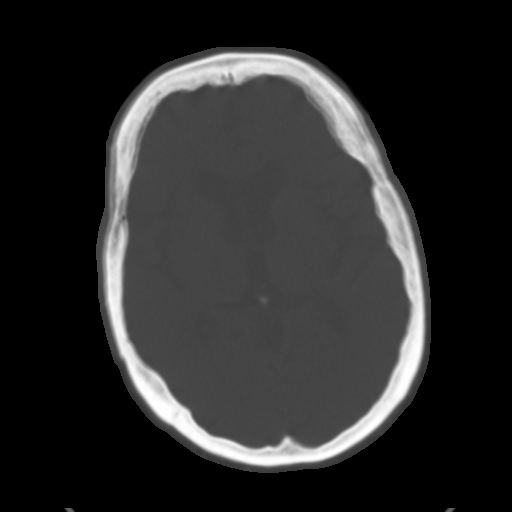
[im 17/32  brain]
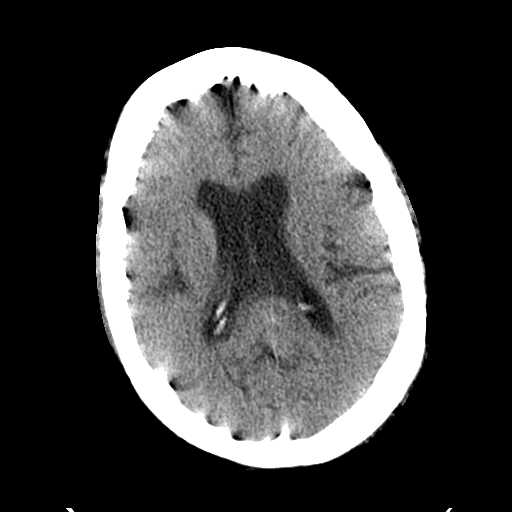
[im 21/32  brain]
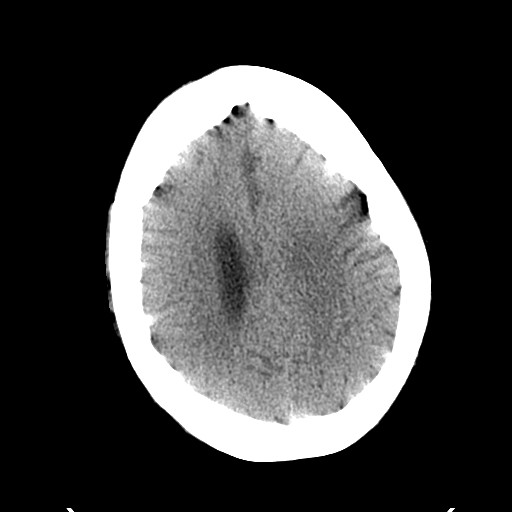
[im 24/32  brain]
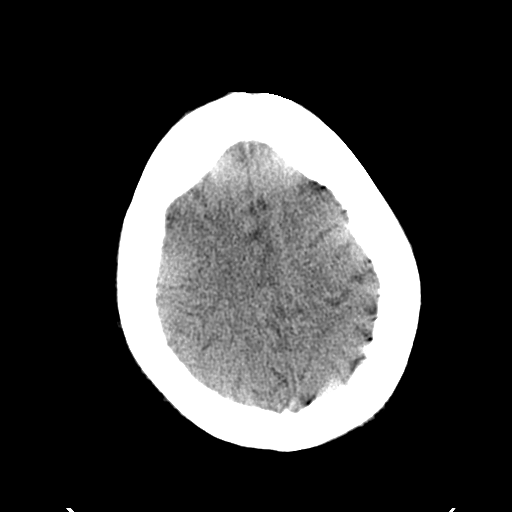
[im 26/32  brain]
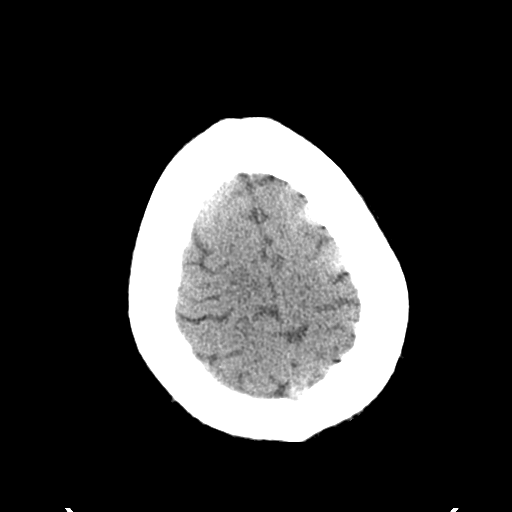
[im 26/32  bone]
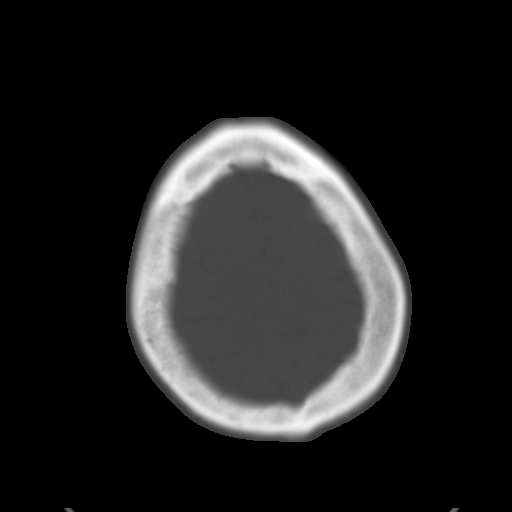
[im 30/32  brain]
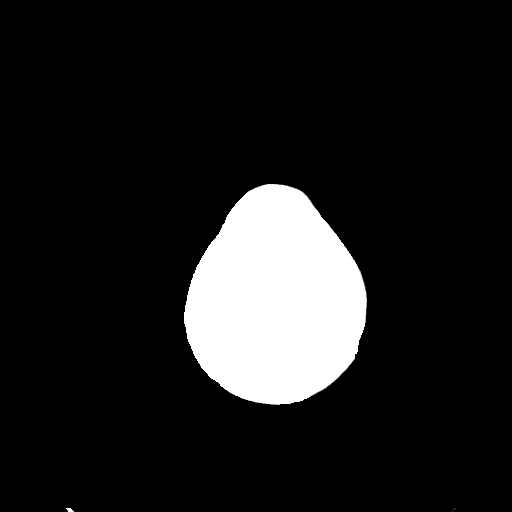

[Series 6: coronal soft tissue · coronal · 0.38mm/px · 3 of 70 slices shown]
[im 14/70  brain]
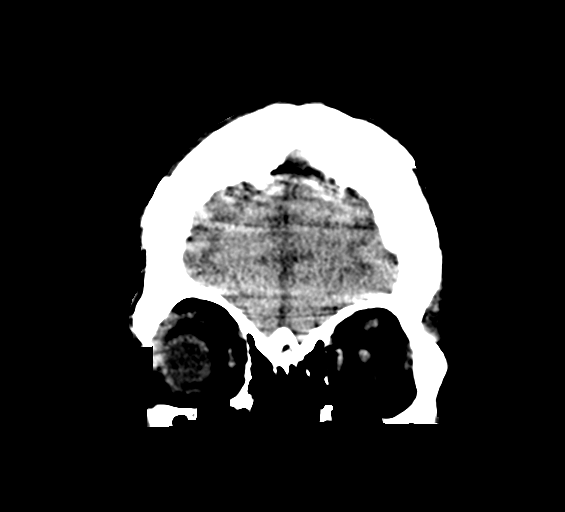
[im 28/70  brain]
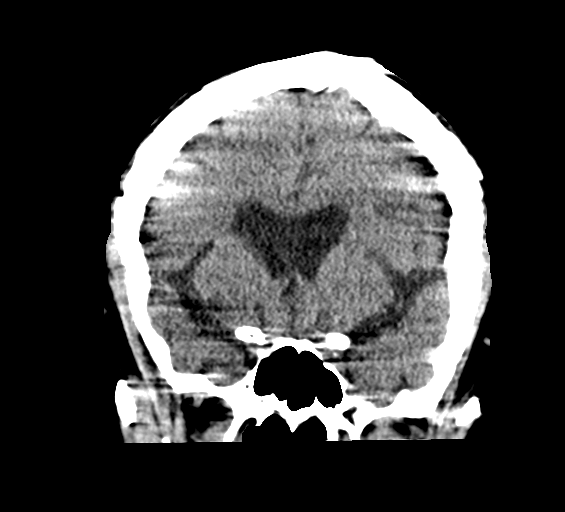
[im 42/70  brain]
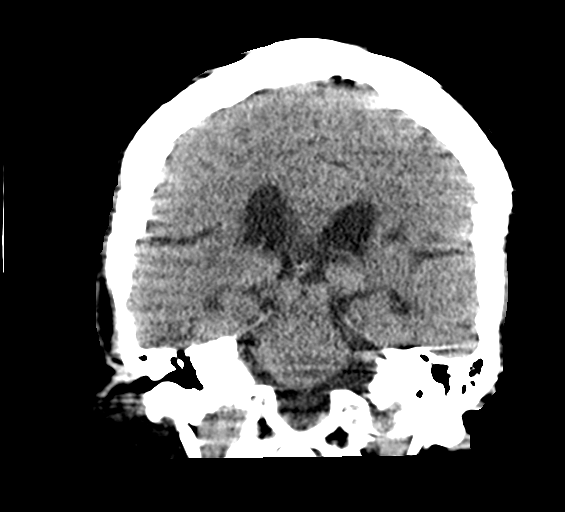

[Series 7: sagittal soft tissue · sagittal · 0.37mm/px · 2 of 50 slices shown]
[im 17/50  brain]
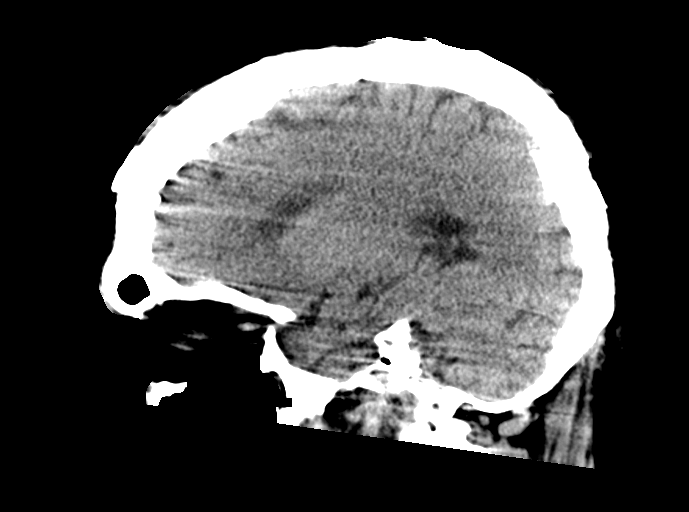
[im 33/50  brain]
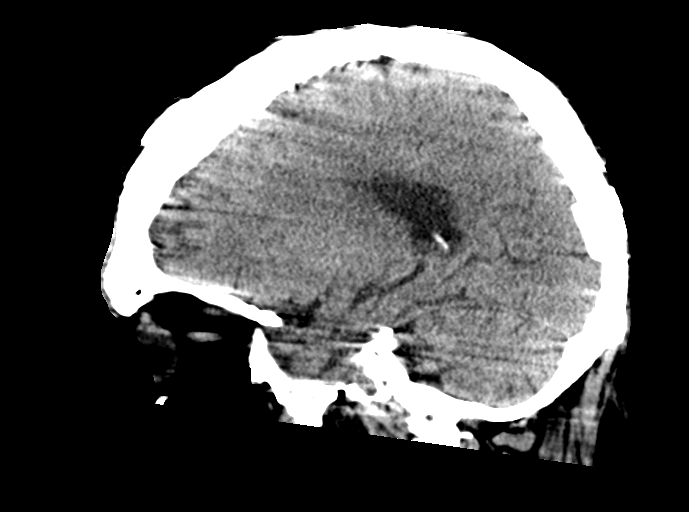

[15 of 47 positions shown; findings below may reference images not displayed]

FINDINGS: CT HEAD FINDINGS

Brain: Patchy areas of decreased attenuation are noted throughout
the deep and periventricular white matter of the cerebral
hemispheres bilaterally, compatible with mild chronic microvascular
ischemic disease. No evidence of acute infarction, hemorrhage,
hydrocephalus, extra-axial collection or mass lesion/mass effect.

Vascular: No hyperdense vessel or unexpected calcification.

Skull: Normal. Negative for fracture or focal lesion.

Sinuses/Orbits: No acute finding.

Other: None.

CT CERVICAL SPINE FINDINGS

Alignment: Mild reversal of normal cervical lordosis centered at the
level of C4-C5, likely chronic and degenerative. Alignment is
otherwise anatomic.

Skull base and vertebrae: No acute fracture. No primary bone lesion
or focal pathologic process.

Soft tissues and spinal canal: No prevertebral fluid or swelling. No
visible canal hematoma.

Disc levels: Multilevel degenerative disc disease, most severe at
C4-C5, C5-C6 and C6-C7. Mild multilevel facet arthropathy.

Upper chest: Unremarkable.

Other: None.
IMPRESSION: 1. No evidence of significant acute traumatic injury to the skull,
brain or cervical spine.
2. Very mild chronic microvascular ischemic changes in the cerebral
white matter, as above.
3. Multilevel degenerative disc disease and cervical spondylosis, as
above.
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
## 2020-02-23 ENCOUNTER — Emergency Department (HOSPITAL_COMMUNITY): Payer: Medicare Other

## 2020-02-23 ENCOUNTER — Emergency Department (HOSPITAL_COMMUNITY)
Admission: EM | Admit: 2020-02-23 | Discharge: 2020-02-23 | Disposition: A | Discharge status: Home / Self Care | Payer: Medicare Other | Attending: Emergency Medicine | Admitting: Emergency Medicine

## 2020-02-23 DIAGNOSIS — Z23 Encounter for immunization: Secondary | ICD-10-CM | POA: Diagnosis not present

## 2020-02-23 DIAGNOSIS — Y92129 Unspecified place in nursing home as the place of occurrence of the external cause: Secondary | ICD-10-CM | POA: Diagnosis not present

## 2020-02-23 DIAGNOSIS — Z79899 Other long term (current) drug therapy: Secondary | ICD-10-CM | POA: Diagnosis not present

## 2020-02-23 DIAGNOSIS — Y999 Unspecified external cause status: Secondary | ICD-10-CM | POA: Insufficient documentation

## 2020-02-23 DIAGNOSIS — Y939 Activity, unspecified: Secondary | ICD-10-CM | POA: Diagnosis not present

## 2020-02-23 DIAGNOSIS — S0101XA Laceration without foreign body of scalp, initial encounter: Secondary | ICD-10-CM | POA: Insufficient documentation

## 2020-02-23 DIAGNOSIS — Z91040 Latex allergy status: Secondary | ICD-10-CM | POA: Diagnosis not present

## 2020-02-23 DIAGNOSIS — R296 Repeated falls: Secondary | ICD-10-CM | POA: Diagnosis not present

## 2020-02-23 DIAGNOSIS — E079 Disorder of thyroid, unspecified: Secondary | ICD-10-CM | POA: Diagnosis not present

## 2020-02-23 DIAGNOSIS — M545 Low back pain: Secondary | ICD-10-CM | POA: Diagnosis not present

## 2020-02-23 DIAGNOSIS — R479 Unspecified speech disturbances: Secondary | ICD-10-CM | POA: Insufficient documentation

## 2020-02-23 DIAGNOSIS — I4891 Unspecified atrial fibrillation: Secondary | ICD-10-CM | POA: Insufficient documentation

## 2020-02-23 DIAGNOSIS — W1830XA Fall on same level, unspecified, initial encounter: Secondary | ICD-10-CM | POA: Diagnosis not present

## 2020-02-23 DIAGNOSIS — F0390 Unspecified dementia without behavioral disturbance: Secondary | ICD-10-CM | POA: Diagnosis not present

## 2020-02-23 DIAGNOSIS — W19XXXA Unspecified fall, initial encounter: Secondary | ICD-10-CM

## 2020-02-23 LAB — CBC WITH DIFFERENTIAL/PLATELET
Abs Immature Granulocytes: 0.03 10*3/uL (ref 0.00–0.07)
Basophils Absolute: 0 10*3/uL (ref 0.0–0.1)
Basophils Relative: 0 %
Eosinophils Absolute: 0.1 10*3/uL (ref 0.0–0.5)
Eosinophils Relative: 1 %
HCT: 40.9 % (ref 39.0–52.0)
Hemoglobin: 12.9 g/dL — ABNORMAL LOW (ref 13.0–17.0)
Immature Granulocytes: 0 %
Lymphocytes Relative: 22 %
Lymphs Abs: 1.5 10*3/uL (ref 0.7–4.0)
MCH: 28.9 pg (ref 26.0–34.0)
MCHC: 31.5 g/dL (ref 30.0–36.0)
MCV: 91.5 fL (ref 80.0–100.0)
Monocytes Absolute: 0.7 10*3/uL (ref 0.1–1.0)
Monocytes Relative: 9 %
Neutro Abs: 4.8 10*3/uL (ref 1.7–7.7)
Neutrophils Relative %: 68 %
Platelets: 56 10*3/uL — ABNORMAL LOW (ref 150–400)
RBC: 4.47 MIL/uL (ref 4.22–5.81)
RDW: 14.1 % (ref 11.5–15.5)
WBC: 7.1 10*3/uL (ref 4.0–10.5)
nRBC: 0 % (ref 0.0–0.2)

## 2020-02-23 LAB — BASIC METABOLIC PANEL
Anion gap: 8 (ref 5–15)
BUN: 11 mg/dL (ref 6–20)
CO2: 25 mmol/L (ref 22–32)
Calcium: 8.7 mg/dL — ABNORMAL LOW (ref 8.9–10.3)
Chloride: 104 mmol/L (ref 98–111)
Creatinine, Ser: 1.03 mg/dL (ref 0.61–1.24)
GFR calc Af Amer: >60 mL/min (ref >60)
GFR calc non Af Amer: >60 mL/min (ref >60)
Glucose, Bld: 84 mg/dL (ref 70–99)
Potassium: 4.1 mmol/L (ref 3.5–5.1)
Sodium: 137 mmol/L (ref 135–145)

## 2020-02-23 LAB — CK: Total CK: 67 U/L (ref 49–397)

## 2020-02-23 MED ORDER — TETANUS-DIPHTH-ACELL PERTUSSIS 5-2.5-18.5 LF-MCG/0.5 IM SUSP
0.5000 mL | Freq: Once | INTRAMUSCULAR | Status: AC
  Administered 2020-02-23: 16:00:00 0.5 mL via INTRAMUSCULAR
  Filled 2020-02-23: qty 0.5

## 2020-02-23 MED ORDER — ACETAMINOPHEN 500 MG PO TABS
1000.0000 mg | ORAL_TABLET | Freq: Once | ORAL | Status: AC
  Administered 2020-02-23: 16:00:00 1000 mg via ORAL
  Filled 2020-02-23: qty 2

## 2020-02-23 NOTE — ED Notes (Signed)
Dermabond at bedside.  

## 2020-02-23 NOTE — Discharge Instructions (Addendum)
Your wound was repaired with Dermabond, a skin tissue adhesive.  You may remove the bandage tomorrow and keep it open to feel like.  This tissue adhesive will dissolve on its own after about 7 days. Thank you for allowing me to care for you today. Please return to the emergency department if you have new or worsening symptoms. Take your medications as instructed.

## 2020-02-23 NOTE — ED Notes (Signed)
[facility] called to transport pt  

## 2020-02-23 NOTE — ED Notes (Signed)
Pt transported to CT ?

## 2020-02-23 NOTE — ED Notes (Signed)
Patient caregiver at [facility] understanding of discharge instructions. Opportunity for questioning and answers were provided. Armband removed by staff. Patient discharged from ED.
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
## 2020-02-23 NOTE — ED Triage Notes (Addendum)
Pt here from [facility], hx TBI, found lying on his right side in the doorway of his room. Lac to R head above ear, bleeding controlled. Staff does not know how long he was there. No blood thinners. Pt at baseline per staff. Can get up with assistance.

## 2020-02-26 ENCOUNTER — Emergency Department (HOSPITAL_COMMUNITY)
Admission: EM | Admit: 2020-02-26 | Discharge: 2020-02-27 | Disposition: A | Discharge status: Home / Self Care | Payer: Medicare Other | Attending: Emergency Medicine | Admitting: Emergency Medicine

## 2020-02-26 ENCOUNTER — Emergency Department (HOSPITAL_COMMUNITY): Payer: Medicare Other

## 2020-02-26 DIAGNOSIS — Z91040 Latex allergy status: Secondary | ICD-10-CM | POA: Insufficient documentation

## 2020-02-26 DIAGNOSIS — I252 Old myocardial infarction: Secondary | ICD-10-CM | POA: Diagnosis not present

## 2020-02-26 DIAGNOSIS — Y9389 Activity, other specified: Secondary | ICD-10-CM | POA: Diagnosis not present

## 2020-02-26 DIAGNOSIS — Y998 Other external cause status: Secondary | ICD-10-CM | POA: Diagnosis not present

## 2020-02-26 DIAGNOSIS — Z79899 Other long term (current) drug therapy: Secondary | ICD-10-CM | POA: Insufficient documentation

## 2020-02-26 DIAGNOSIS — Y92129 Unspecified place in nursing home as the place of occurrence of the external cause: Secondary | ICD-10-CM | POA: Diagnosis not present

## 2020-02-26 DIAGNOSIS — E039 Hypothyroidism, unspecified: Secondary | ICD-10-CM | POA: Diagnosis not present

## 2020-02-26 DIAGNOSIS — S0990XA Unspecified injury of head, initial encounter: Secondary | ICD-10-CM

## 2020-02-26 DIAGNOSIS — W19XXXA Unspecified fall, initial encounter: Secondary | ICD-10-CM

## 2020-02-26 DIAGNOSIS — S0181XA Laceration without foreign body of other part of head, initial encounter: Secondary | ICD-10-CM | POA: Diagnosis not present

## 2020-02-26 MED ORDER — LIDOCAINE-EPINEPHRINE-TETRACAINE (LET) TOPICAL GEL
3.0000 mL | Freq: Once | TOPICAL | Status: AC
  Administered 2020-02-26: 3 mL via TOPICAL
  Filled 2020-02-26: qty 3

## 2020-02-26 NOTE — ED Notes (Signed)
Wound care completed on the right head laceration and LET applied to wound.

## 2020-02-26 NOTE — ED Notes (Signed)
Pt to and from CT via cart. Pt conscious, breathing, and A&Ox4. No distress noted. Pt keeps trying to remove C-collar. This RN keeps applying. Pt informed of the risks.

## 2020-02-26 NOTE — ED Triage Notes (Signed)
Pt comes from [facility] for an unwitnessed fall. Pt has a laceration to the right forehead and complains of right shoulder pain

## 2020-02-26 NOTE — ED Provider Notes (Signed)
[facility] EMERGENCY DEPARTMENT Provider Note   CSN: [id number] Arrival date & time: 02/26/20  1830     History No chief complaint on file.   [name] is a 54 y.o. male.  The history is provided by the patient, medical records and the EMS personnel. The history is limited by the condition of the patient. No language interpreter was used.  Laceration Location:  Face Facial laceration location:  Forehead Length:  1 Depth:  Cutaneous Quality: straight   Bleeding: controlled   Laceration mechanism:  Fall Foreign body present:  No foreign bodies Relieved by:  Nothing Ineffective treatments:  None tried Tetanus status:  Up to date Associated symptoms: no fever and no rash        Past Medical History:  Diagnosis Date  . Addison disease ([place])   . Anoxic brain injury ([place])   . Anxiety   . Aspiration pneumonia ([place])   . Atrial fibrillation ([place])   . Chronic constipation 11/23/2011  . Dementia ([employer])   . Dysphagia   . Encephalopathy   . Glaucoma   . Glucocorticoid deficiency ([place])   . History of recurrent UTIs   . Hyperlipemia   . Hypothyroidism   . Mental disorder   . Myocardial infarction ([place])   . Pneumoperitoneum of unknown etiology 12/21/2011  . Quadriplegia ([place])   . Reflux   . Seizure disorder [facility])     Patient Active Problem List   Diagnosis Date Noted  . PEG Gastrostomy tube in place ([place]) 01/26/2018  . 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 01/26/2018 01/26/2018  . Bedridden 01/26/2018  . Quadriplegia ([place])   . Abdominal wall abscess 01/25/2018  . Fracture of humerus anatomical neck, right, closed, initial encounter 10/03/2017  . AKI (acute kidney injury) ([place]) 10/03/2017  . UTI (urinary tract infection) 10/03/2017  . Agitation 07/03/2014  . Cardiomyopathy, ischemic 06/18/2014  . 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 Acute psychosis ([place]) 04/19/2014  . Unspecified hypothyroidism 05/16/2013  . Constipation  05/16/2013  . Anemia of other chronic disease 05/16/2013  . Mental disorder 01/03/2013  . Addisons disease ([place]) 01/03/2013  . PEG (percutaneous endoscopic gastrostomy) adjustment/replacement/removal ([place]) 12/21/2011  . Hypokalemia 11/25/2011  . Thrombocytopenia ([place]) 11/25/2011  . FTT (failure to thrive) in adult 11/25/2011  . Grand mal seizure ([place]) 11/23/2011  . Depression (em[place]) 11/23/2011  . History of atrial fibrillation 11/23/2011  . Other specified hemorrhagic conditions ([place]) 11/23/2011  . Glaucoma 11/23/2011  . Hypoglycemia 11/23/2011  . Chronic constipation 11/23/2011  . Anoxic brain injury ([place]) 11/12/2011  . Protein calorie malnutrition ([place]) 11/12/2011  . Hyperchloremia 11/12/2011  . Hyperthyroidism 11/12/2011  . Acute lower UTI 11/12/2011  . Dysphagia     Past Surgical History:  Procedure Laterality Date  . DEBRIDEMENT OF ABDOMINAL WALL ABSCESS  01/26/2018  . HERNIA MESH REMOVAL  01/26/2018   Infected mesh - removed w I&D abd wall abscess  . IR GENERIC HISTORICAL  10/20/2016   IR [place] TUBE PERCUT W/FLUORO 10/20/2016 Arne Cleveland, MD [facility]-INTERV RAD  . IR REPLACE G-TUBE SIMPLE WO FLUORO  01/28/2018  . IR [place] TUBE PERCUT W/FLUORO  04/21/2017  . IR [facility]PLC GASTRO/COLONIC TUBE PERCUT W/FLUORO  07/22/2017  . IR [place] PERCUT W/FLUORO  11/24/2017  . IRRIGATION AND DEBRIDEMENT ABSCESS N/A 01/26/2018   Procedure: IRRIGATION AND DEBRIDEMENT ABDOMINAL WALL ABSCESS WITH REMOVAL OF INFECTED MESH;  Surgeon: Michael Boston, MD;  Location: [facility] ORS;  Service: General;  Laterality: N/A;  .  NEPHRECTOMY  unknown  . PEG TUBE PLACEMENT         No family history on file.  Social History   Tobacco Use  . Smoking status: Never Smoker  . Smokeless tobacco: Never Used  Substance Use Topics  . Alcohol use: No  . Drug use: No    Home Medications Prior to Admission medications   Medication Sig Start Date End Date Taking?  Authorizing Provider  acetaminophen (TYLENOL) 325 MG tablet Place 650 mg into feeding tube every 4 (four) hours as needed for mild pain.    [provider]  Amino Acids-Protein Hydrolys (FEEDING SUPPLEMENT, PRO-STAT SUGAR FREE 64,) LIQD Place 30 mLs into feeding tube 2 (two) times daily.     [provider]  aspirin 81 MG chewable tablet Chew 81 mg by mouth daily.    [provider]  Carboxymeth-Glycerin-Polysorb (REFRESH OPTIVE ADVANCED OP) Place 1 drop into both eyes 3 (three) times daily.    [provider]  divalproex (DEPAKOTE SPRINKLE) 125 MG capsule Take 500 mg by mouth 2 (two) times daily.  07/18/13   [provider]  levofloxacin (LEVAQUIN) 500 MG tablet Take 500 mg by mouth daily. 7 day supply    [provider]  levothyroxine (SYNTHROID, LEVOTHROID) 150 MCG tablet Place 150 mcg into feeding tube daily before breakfast.     [provider]  metoprolol tartrate (LOPRESSOR) 25 MG tablet Take 12.5 mg by mouth 2 (two) times daily.    [provider]  Multiple Vitamins-Minerals (CERTAVITE/ANTIOXIDANTS) LIQD 15 mLs by PEG Tube route daily at 12 noon.     [provider]  pantoprazole sodium (PROTONIX) 40 mg/20 mL PACK 40 mg by PEG Tube route daily at 12 noon.  11/30/11   [name], MD  PARoxetine (PAXIL) 30 MG tablet Take 15 mg by mouth daily.    [provider]  polyethylene glycol (MIRALAX / GLYCOLAX) packet 17 g by PEG Tube route 2 (two) times daily. Mix 17gm  In 4-8 ounces of liquid    [provider]  polyvinyl alcohol (LIQUIFILM TEARS) 1.4 % ophthalmic solution Place 1 drop into both eyes 2 (two) times daily.    [provider]  risperiDONE (RISPERDAL M-TABS) 1 MG disintegrating tablet Place 1 mg under the tongue 3 (three) times daily.  05/08/17   [provider]  sennosides (SENOKOT) 8.8 MG/5ML syrup Place 15 mLs into feeding tube 2 (two) times daily.     [provider]  traZODone (DESYREL) 150 MG tablet Take 75 mg by mouth at bedtime.  08/02/13   [provider]    Allergies    Ativan [lorazepam], Latex, Morphine and related, Penicillins, Pyridium [phenazopyridine hcl], and Soy allergy  Review of Systems   Review of Systems  Unable to perform ROS: Patient nonverbal  Constitutional: Negative for fever.  Respiratory: Negative for shortness of breath.   Cardiovascular: Negative for chest pain and leg swelling.  Gastrointestinal: Negative for abdominal pain, constipation, diarrhea, nausea and vomiting.  Musculoskeletal: Negative for back pain, neck pain and neck stiffness.  Skin: Positive for color change (bruising from recent fall) and wound. Negative for rash.  Neurological: Positive for headaches.  Psychiatric/Behavioral: Negative for agitation.  All other systems reviewed and are negative.   Physical Exam Updated Vital Signs BP 108/65 (BP Location: Left Arm)   Pulse 80   Temp 98.3 F (36.8 C) (Oral)   Resp 15   SpO2 99%   Physical Exam  Vitals and nursing note reviewed.  Constitutional:      General: He is not in acute distress.    Appearance: He is well-developed. He is not ill-appearing, toxic-appearing or diaphoretic.  HENT:     Head:      Comments: Normal extraocular movements.  Normal pupils.  Tenderness at laceration site.  Bruising on rest of face from recent falls otherwise.    Nose: No congestion.     Mouth/Throat:     Mouth: Mucous membranes are moist.     Pharynx: No oropharyngeal exudate or posterior oropharyngeal erythema.  Eyes:     Extraocular Movements: Extraocular movements intact.     Conjunctiva/sclera: Conjunctivae normal.     Pupils: Pupils are equal, round, and reactive to light.  Cardiovascular:     Rate and Rhythm: Normal rate and regular rhythm.     Pulses: Normal pulses.     Heart sounds: No murmur.  Pulmonary:     Effort: Pulmonary effort is normal. No respiratory distress.      Breath sounds: Normal breath sounds.  Abdominal:     General: Abdomen is flat.     Palpations: Abdomen is soft.     Tenderness: There is no abdominal tenderness. There is no right CVA tenderness, left CVA tenderness, guarding or rebound.  Musculoskeletal:        General: Tenderness present. No swelling.     Cervical back: Neck supple. No tenderness.     Right lower leg: No edema.     Left lower leg: No edema.     Comments: Tenderness of left shoulder and left upper chest.  Symmetric grip strength and sensation.  Good pulses in extremities.  Skin:    General: Skin is warm and dry.     Capillary Refill: Capillary refill takes less than 2 seconds.     Findings: Bruising present. No erythema.  Neurological:     Mental Status: He is alert. Mental status is at baseline.     ED Results / Procedures / Treatments   Labs (all labs ordered are listed, but only abnormal results are displayed) Labs Reviewed - No data to display  EKG EKG Interpretation  Date/Time:  Monday February 26 2020 18:37:06 EST Ventricular Rate:  81 PR Interval:    QRS Duration: 92 QT Interval:  366 QTC Calculation: 425 R Axis:   48 Text Interpretation: Sinus rhythm When comapred to prior, no significant cahnges seen. No STEMI Confirmed by Theda Belfast ([id number]) on 02/26/2020 7:04:57 PM   Radiology DG Chest 2 View  Result Date: 02/26/2020 CLINICAL DATA:  Fall with upper chest pain EXAM: CHEST - 2 VIEW COMPARISON:  07/04/2019 FINDINGS: Chronic elevation of right diaphragm. Subsegmental atelectasis at the right base. No consolidation, pleural effusion, or pneumothorax. Stable cardiomediastinal silhouette. IMPRESSION: No active cardiopulmonary disease. Subsegmental atelectasis at the right base Electronically Signed   By: Jasmine Pang M.D.   On: 02/26/2020 20:22   CT Head Wo Contrast  Result Date: 02/26/2020 CLINICAL DATA:  Fall, headache EXAM: CT HEAD WITHOUT CONTRAST TECHNIQUE: Contiguous axial images were obtained  from the base of the skull through the vertex without intravenous contrast. COMPARISON:  02/23/2020 FINDINGS: Brain: There is no acute intracranial hemorrhage, mass-effect, or edema. Gray-white differentiation is preserved. There is no extra-axial fluid collection. Ventricles and sulci are stable in size and configuration. Vascular: There is atherosclerotic calcification at the skull base. Skull: Calvarium is intact. Chronic nasal bone deformity. Chronic bowing of the left lamina papyracea, which may  be posttraumatic. Sinuses/Orbits: No acute finding. Other: None. IMPRESSION: No evidence of acute intracranial injury. Electronically Signed   By: Guadlupe Spanish M.D.   On: 02/26/2020 21:17   CT Cervical Spine Wo Contrast  Result Date: 02/26/2020 CLINICAL DATA:  Fall EXAM: CT CERVICAL SPINE WITHOUT CONTRAST TECHNIQUE: Multidetector CT imaging of the cervical spine was performed without intravenous contrast. Multiplanar CT image reconstructions were also generated. COMPARISON:  02/23/2020 FINDINGS: Motion degradation. Alignment: Stable. Skull base and vertebrae: No acute fracture within the above limitation. Stable vertebral body heights. Soft tissues and spinal canal: No prevertebral fluid or swelling. No visible canal hematoma. Disc levels: Multilevel degenerative changes are similar in appearance over the short interval. Upper chest: Small right pleural effusion. Other: None IMPRESSION: No acute cervical spine fracture within limitation of motion artifact. Electronically Signed   By: Guadlupe Spanish M.D.   On: 02/26/2020 21:21   DG Shoulder Left  Result Date: 02/26/2020 CLINICAL DATA:  Fall with left upper chest pain and shoulder pain EXAM: LEFT SHOULDER - 2+ VIEW COMPARISON:  12/05/2019 FINDINGS: AC joint is intact.  No fracture or malalignment. IMPRESSION: No acute osseous abnormality. Electronically Signed   By: Jasmine Pang M.D.   On: 02/26/2020 20:23    Procedures Procedures (including critical care  time)  Medications Ordered in ED Medications  lidocaine-EPINEPHrine-tetracaine (LET) topical gel (3 mLs Topical Given 02/26/20 2359)    ED Course  I have reviewed the triage vital signs and the nursing notes.  Pertinent labs & imaging results that were available during my care of the patient were reviewed by me and considered in my medical decision making (see chart for details).    MDM Rules/Calculators/A&P                      [name] is a 54 y.o. male with a past medical history significant for atrial fibrillation, dementia, quadriplegia, Addison's disease, hypothyroidism, anxiety, hyperlipidemia, and prior anoxic brain injury who presents with recurrent fall.  Patient arrives by EMS who told nursing the patient had unwitnessed fall today with a recurrent laceration to his right forehead.  Patient was recently seen for fall and lacerations.  Patient is alert and is able to report he is having pain in his right shoulder, mild pain to right upper chest, and some pain and tenderness on his right forehead where he has a laceration.  His Tdap was up-to-date from last visit on chart review.  He denies significant neck pain but arrives in a cervical immobilization collar.  On exam, lungs are clear.  Patient had some mild tenderness on his right shoulder and pain with shoulder movement.  Good grip strength.  [place] denies sensation abnormality of the arms.  Legs nontender.  Abdomen nontender.  Possible tenderness in his right upper chest but breath sounds were normal.  CT collar in place but he does not have neck tenderness.  Patient tenderness on his right forehead where he has a small 1 cm vertical laceration.  It is hemostatic.  Pupils are symmetric and reactive.  No facial droop.  Patient able to answer some yes/no questions primarily.  Patient was CT of the head, neck, chest x-ray, shoulder x-ray.  Laceration will be repaired after imaging.  Anticipate discharge back to facility after  work-up.  X-rays and CT scans were reassuring.  C-collar was removed.  Patient had laceration repaired with 5 absorbable sutures.  Washout was completed and there is no evidence of significantly deep  injury.  Patient will be discharged back to facility for outpatient follow-up.  Patient discharged in good condition.    Final Clinical Impression(s) / ED Diagnoses Final diagnoses:  Injury of head, initial encounter  Fall, initial encounter  Laceration of forehead, initial encounter    Rx / DC Orders ED Discharge Orders    None      Clinical Impression: 1. Injury of head, initial encounter   2. Fall, initial encounter   3. Laceration of forehead, initial encounter     Disposition: Discharge  Condition: Good  I have discussed the results, Dx and Tx plan with the pt(& family if present). He/she/they expressed understanding and agree(s) with the plan. Discharge instructions discussed at great length. Strict return precautions discussed and pt &/or family have verbalized understanding of the instructions. No further questions at time of discharge.    New Prescriptions   No medications on file    Follow Up: Georgann Housekeeper, MD [address] [place] [address] [phone]     [facility] EMERGENCY DEPARTMENT [address] [id number] mc [place] [address] [phone]       Dalisha Shively, Canary Brim, MD 02/27/20 [phone]

## 2020-02-27 NOTE — ED Notes (Signed)
[facility] called to transport pt 

## 2020-02-27 NOTE — Discharge Instructions (Signed)
Your work-up today showed no evidence of skull fracture or significant injury to your shoulder chest neck or head.  We remove the cervical immobilization collar after your CT was reassuring.  You had a small laceration to your forehead which we repaired.  Your tetanus is up-to-date.  We washed it out, please watch for infection.  The sutures we used were absorbable so they will be absorbed on their own.  Please keep the wound clean.  If any symptoms change or worsen, please return to the nearest emergency department.  Please be careful not to fall again.

## 2020-02-27 NOTE — ED Notes (Signed)
RN to RN report called to [facility] to [place] with no questions or concerns.

## 2020-02-27 NOTE — ED Notes (Signed)
Pt was discharged from the ED. Pt read and understood discharge paperwork. Pt had vital signs completed. Pt conscious, breathing, and A&Ox1. No distress note.. Pt transported to [facility] by [facility] [facility]. All belongings with pt. All paperwork given to EMS.

## 2020-02-29 ENCOUNTER — Emergency Department (HOSPITAL_COMMUNITY): Payer: Medicare Other

## 2020-02-29 ENCOUNTER — Emergency Department (HOSPITAL_COMMUNITY)
Admission: EM | Admit: 2020-02-29 | Discharge: 2020-03-01 | Disposition: A | Discharge status: Home / Self Care | Payer: Medicare Other | Attending: Emergency Medicine | Admitting: Emergency Medicine

## 2020-02-29 DIAGNOSIS — Y999 Unspecified external cause status: Secondary | ICD-10-CM | POA: Diagnosis not present

## 2020-02-29 DIAGNOSIS — S2241XA Multiple fractures of ribs, right side, initial encounter for closed fracture: Secondary | ICD-10-CM | POA: Insufficient documentation

## 2020-02-29 DIAGNOSIS — Z91040 Latex allergy status: Secondary | ICD-10-CM | POA: Insufficient documentation

## 2020-02-29 DIAGNOSIS — I252 Old myocardial infarction: Secondary | ICD-10-CM | POA: Diagnosis not present

## 2020-02-29 DIAGNOSIS — Z931 Gastrostomy status: Secondary | ICD-10-CM | POA: Diagnosis not present

## 2020-02-29 DIAGNOSIS — Y92122 Bedroom in nursing home as the place of occurrence of the external cause: Secondary | ICD-10-CM | POA: Diagnosis not present

## 2020-02-29 DIAGNOSIS — F0390 Unspecified dementia without behavioral disturbance: Secondary | ICD-10-CM | POA: Diagnosis not present

## 2020-02-29 DIAGNOSIS — Y939 Activity, unspecified: Secondary | ICD-10-CM | POA: Diagnosis not present

## 2020-02-29 DIAGNOSIS — E039 Hypothyroidism, unspecified: Secondary | ICD-10-CM | POA: Diagnosis not present

## 2020-02-29 DIAGNOSIS — S161XXA Strain of muscle, fascia and tendon at neck level, initial encounter: Secondary | ICD-10-CM | POA: Insufficient documentation

## 2020-02-29 DIAGNOSIS — S299XXA Unspecified injury of thorax, initial encounter: Secondary | ICD-10-CM | POA: Diagnosis present

## 2020-02-29 DIAGNOSIS — S0990XA Unspecified injury of head, initial encounter: Secondary | ICD-10-CM | POA: Diagnosis not present

## 2020-02-29 DIAGNOSIS — W19XXXA Unspecified fall, initial encounter: Secondary | ICD-10-CM

## 2020-02-29 DIAGNOSIS — R1903 Right lower quadrant abdominal swelling, mass and lump: Secondary | ICD-10-CM | POA: Diagnosis not present

## 2020-02-29 DIAGNOSIS — G8250 Quadriplegia, unspecified: Secondary | ICD-10-CM | POA: Diagnosis not present

## 2020-02-29 DIAGNOSIS — W0110XA Fall on same level from slipping, tripping and stumbling with subsequent striking against unspecified object, initial encounter: Secondary | ICD-10-CM | POA: Diagnosis not present

## 2020-02-29 DIAGNOSIS — Z79899 Other long term (current) drug therapy: Secondary | ICD-10-CM | POA: Diagnosis not present

## 2020-02-29 NOTE — ED Triage Notes (Signed)
Pt presents to the ED via [facility] from [facility] for an unwitnessed fall. Per staff report to EMS patient is acting his baseline. Does have pmh of TBI. Drainage noted from patient's g-tube. EMS also noted a large mass to patient's right flank that staff was unaware of. Pt reporting head pain upon ED arrival.

## 2020-02-29 NOTE — Discharge Instructions (Addendum)
Continue medications as previously prescribed.  Follow-up with primary doctor in the next few days for recheck, and return to the ER if symptoms significantly worsen or change.

## 2020-02-29 NOTE — ED Notes (Signed)
Pt. Transported to CT 

## 2020-02-29 NOTE — ED Provider Notes (Signed)
[facility] EMERGENCY DEPARTMENT Provider Note   CSN: [id number] Arrival date & time: 02/29/20  1517     History Chief Complaint  Patient presents with  . Fall    Square Jowett is a 54 y.o. male.  Patient is a 54 year old male with past medical history of traumatic brain injury, quadriplegia, atrial fibrillation.  He presents by EMS for evaluation of fall.  Patient was at his extended care facility where he was found on the floor.  Patient has extremely little history secondary to baseline medical issues.  The history is provided by the patient and the EMS personnel.  Fall This is a new problem. The current episode started less than 1 hour ago. The problem occurs constantly. Associated symptoms include headaches. Nothing aggravates the symptoms. Nothing relieves the symptoms. He has tried nothing for the symptoms.       Past Medical History:  Diagnosis Date  . Addison disease ([place])   . Anoxic brain injury ([place])   . Anxiety   . Aspiration pneumonia ([place])   . Atrial fibrillation ([place])   . Chronic constipation 11/23/2011  . Dementia ([place])   . Dysphagia   . Encephalopathy   . Glaucoma   . Glucocorticoid deficiency ([place])   . History of recurrent UTIs   . Hyperlipemia   . Hypothyroidism   . Mental disorder   . Myocardial infarction ([place])   . Pneumoperitoneum of unknown etiology 12/21/2011  . Quadriplegia ([place])   . Reflux   . Seizure disorder [facility])     Patient Active Problem List   Diagnosis Date Noted  . PEG Gastrostomy tube in place ([place]) 01/26/2018  . 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 01/26/2018 01/26/2018  . Bedridden 01/26/2018  . Quadriplegia ([place])   . Abdominal wall abscess 01/25/2018  . Fracture of humerus anatomical neck, right, closed, initial encounter 10/03/2017  . AKI (acute kidney injury) ([place]) 10/03/2017  . UTI (urinary tract infection) 10/03/2017  . Agitation 07/03/2014  . Cardiomyopathy, ischemic 06/18/2014    . 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 Acute psychosis ([place]) 04/19/2014  . Unspecified hypothyroidism 05/16/2013  . Constipation 05/16/2013  . Anemia of other chronic disease 05/16/2013  . Mental disorder 01/03/2013  . Addisons disease ([place]) 01/03/2013  . PEG (percutaneous endoscopic gastrostomy) adjustment/replacement/removal ([place]) 12/21/2011  . Hypokalemia 11/25/2011  . Thrombocytopenia ([place]) 11/25/2011  . FTT (failure to thrive) in adult 11/25/2011  . Grand mal seizure ([place]) 11/23/2011  . Depression (emotion) 11/23/2011  . History of atrial fibrillation 11/23/2011  . Other specified hemorrhagic conditions ([place]) 11/23/2011  . Glaucoma 11/23/2011  . Hypoglycemia 11/23/2011  . Chronic constipation 11/23/2011  . Anoxic brain injury ([place]) 11/12/2011  . Protein calorie malnutrition ([place]) 11/12/2011  . Hyperchloremia 11/12/2011  . Hyperthyroidism 11/12/2011  . Acute lower UTI 11/12/2011  . Dysphagia     Past Surgical History:  Procedure Laterality Date  . DEBRIDEMENT OF ABDOMINAL WALL ABSCESS  01/26/2018  . HERNIA MESH REMOVAL  01/26/2018   Infected mesh - removed w I&D abd wall abscess  . IR GENERIC HISTORICAL  10/20/2016   IR [place] TUBE PERCUT W/FLUORO 10/20/2016 Arne Cleveland, MD [facility]-INTER[facility] RAD  . IR REPLACE G-TUBE SIMPLE WO FLUORO  01/28/2018  . IR [place] TUBE PERCUT W/FLUORO  04/21/2017  . IR REPLC GASTRO/COLONIC TUBE PERCUT W/FLUORO  07/22/2017  . IR [place] PERCUT W/FLUORO  11/24/2017  . IRRIGATION AND DEBRIDEMENT ABSCESS N/A 01/26/2018   Procedure: IRRIGATION AND DEBRIDEMENT ABDOMINAL  WALL ABSCESS WITH REMOVAL OF INFECTED MESH;  Surgeon: Karie Soda, MD;  Location: [facility] ORS;  Service: General;  Laterality: N/A;  . NEPHRECTOMY  unknown  . PEG TUBE PLACEMENT         No family history on file.  Social History   Tobacco Use  . Smoking status: Never Smoker  . Smokeless tobacco: Never Used  Substance Use Topics  . Alcohol  use: No  . Drug use: No    Home Medications Prior to Admission medications   Medication Sig Start Date End Date Taking? Authorizing Provider  acetaminophen (TYLENOL) 325 MG tablet Place 650 mg into feeding tube every 4 (four) hours as needed for mild pain.    [provider]  Amino Acids-Protein Hydrolys (FEEDING SUPPLEMENT, PRO-STAT SUGAR FREE 64,) LIQD Place 30 mLs into feeding tube 2 (two) times daily.     [provider]  aspirin 81 MG chewable tablet Chew 81 mg by mouth daily.    [provider]  Carboxymeth-Glycerin-Polysorb (REFRESH OPTIVE ADVANCED OP) Place 1 drop into both eyes 3 (three) times daily.    [provider]  divalproex (DEPAKOTE SPRINKLE) 125 MG capsule Take 500 mg by mouth 2 (two) times daily.  07/18/13   [provider]  levofloxacin (LEVAQUIN) 500 MG tablet Take 500 mg by mouth daily. 7 day supply    [provider]  levothyroxine (SYNTHROID, LEVOTHROID) 150 MCG tablet Place 150 mcg into feeding tube daily before breakfast.     [provider]  metoprolol tartrate (LOPRESSOR) 25 MG tablet Take 12.5 mg by mouth 2 (two) times daily.    [provider]  Multiple Vitamins-Minerals (CERTAVITE/ANTIOXIDANTS) LIQD 15 mLs by PEG Tube route daily at 12 noon.     [provider]  pantoprazole sodium (PROTONIX) 40 mg/20 mL PACK 40 mg by PEG Tube route daily at 12 noon.  11/30/11   [name], MD  PARoxetine (PAXIL) 30 MG tablet Take 15 mg by mouth daily.    [provider]  polyethylene glycol (MIRALAX / GLYCOLAX) packet 17 g by PEG Tube route 2 (two) times daily. Mix 17gm  In 4-8 ounces of liquid    [provider]  polyvinyl alcohol (LIQUIFILM TEARS) 1.4 % ophthalmic solution Place 1 drop into both eyes 2 (two) times daily.    [provider]  risperiDONE (RISPERDAL M-TABS) 1 MG disintegrating tablet Place 1 mg under the tongue 3 (three) times daily.  05/08/17   [provider]  sennosides (SENOKOT) 8.8 MG/5ML syrup Place 15 mLs into feeding tube 2 (two) times daily.     [provider]  traZODone (DESYREL) 150 MG tablet Take 75 mg by mouth at bedtime.  08/02/13   [provider]    Allergies    Ativan [lorazepam], Latex, Morphine and related, Penicillins, Pyridium [phenazopyridine hcl], and Soy allergy  Review of Systems   Review of Systems  Neurological: Positive for headaches.  All other systems reviewed and are negative.   Physical Exam Updated Vital Signs BP 106/72 (BP Location: Right Arm)   Pulse 79   Temp 98.7 F (37.1 C) (Oral)   Resp 14   SpO2 98%   Physical Exam Vitals and nursing note reviewed.  Constitutional:      General: He is not in acute distress.    Appearance: He is well-developed. He is not diaphoretic.  HENT:     Head: Normocephalic and atraumatic.  Cardiovascular:     Rate  and Rhythm: Normal rate and regular rhythm.     Heart sounds: No murmur. No friction rub.  Pulmonary:     Effort: Pulmonary effort is normal. No respiratory distress.     Breath sounds: Normal breath sounds. No wheezing or rales.  Abdominal:     General: Bowel sounds are normal. There is no distension.     Palpations: Abdomen is soft.     Tenderness: There is no abdominal tenderness.  Musculoskeletal:        General: Normal range of motion.     Cervical back: Normal range of motion and neck supple.  Skin:    General: Skin is warm and dry.  Neurological:     General: No focal deficit present.     Mental Status: He is alert.     Comments: Patient is awake and alert.  He will give simple answers to questions..  Patient with baseline leg paraplegia with minimal use of both arms.     ED Results / Procedures / Treatments   Labs (all labs ordered are listed, but only abnormal results are displayed) Labs Reviewed - No data to display  EKG None  Radiology No results found.  Procedures Procedures (including critical  care time)  Medications Ordered in ED Medications - No data to display  ED Course  I have reviewed the triage vital signs and the nursing notes.  Pertinent labs & imaging results that were available during my care of the patient were reviewed by me and considered in my medical decision making (see chart for details).    MDM Rules/Calculators/A&P  Patient is a 54 year old male with history of traumatic brain injury and quadriplegia.  He presents today with complaints of a fall.  He apparently fell out of the bed at his extended care facility and was found on the floor.  Patient CT scan of the head and cervical spine unremarkable.  A CT scan of the abdomen was obtained showing suspicious findings for rib fractures.  Chest x-ray shows no evidence of pneumothorax.  At this point, patient seems appropriate for discharge.  I did speak with patient's mother, Talbert Forest and informed her of the findings and disposition.  Final Clinical Impression(s) / ED Diagnoses Final diagnoses:  None    Rx / DC Orders ED Discharge Orders    None       Geoffery Lyons, MD 02/29/20 2031

## 2020-02-29 NOTE — ED Notes (Signed)
Report attempted twice to [facility] without answer

## 2020-02-29 NOTE — ED Notes (Signed)
Report given to [facility] LPN at [facility]

## 2020-02-29 NOTE — ED Notes (Signed)
Pt to xray

## 2020-03-01 NOTE — ED Notes (Signed)
Pt transported to [facility] via [facility]. Report given to [facility]

## 2020-03-04 ENCOUNTER — Emergency Department (HOSPITAL_COMMUNITY)
Admission: EM | Admit: 2020-03-04 | Discharge: 2020-03-04 | Disposition: A | Discharge status: Home / Self Care | Payer: Medicare Other | Attending: Emergency Medicine | Admitting: Emergency Medicine

## 2020-03-04 ENCOUNTER — Encounter (HOSPITAL_COMMUNITY): Payer: Self-pay | Admitting: Emergency Medicine

## 2020-03-04 ENCOUNTER — Emergency Department (HOSPITAL_COMMUNITY): Payer: Medicare Other

## 2020-03-04 DIAGNOSIS — Y939 Activity, unspecified: Secondary | ICD-10-CM | POA: Diagnosis not present

## 2020-03-04 DIAGNOSIS — F0390 Unspecified dementia without behavioral disturbance: Secondary | ICD-10-CM | POA: Insufficient documentation

## 2020-03-04 DIAGNOSIS — Z79899 Other long term (current) drug therapy: Secondary | ICD-10-CM | POA: Insufficient documentation

## 2020-03-04 DIAGNOSIS — Z7982 Long term (current) use of aspirin: Secondary | ICD-10-CM | POA: Diagnosis not present

## 2020-03-04 DIAGNOSIS — E039 Hypothyroidism, unspecified: Secondary | ICD-10-CM | POA: Diagnosis not present

## 2020-03-04 DIAGNOSIS — Y999 Unspecified external cause status: Secondary | ICD-10-CM | POA: Diagnosis not present

## 2020-03-04 DIAGNOSIS — Z91040 Latex allergy status: Secondary | ICD-10-CM | POA: Insufficient documentation

## 2020-03-04 DIAGNOSIS — Y92122 Bedroom in nursing home as the place of occurrence of the external cause: Secondary | ICD-10-CM | POA: Diagnosis not present

## 2020-03-04 DIAGNOSIS — S01112A Laceration without foreign body of left eyelid and periocular area, initial encounter: Secondary | ICD-10-CM | POA: Diagnosis not present

## 2020-03-04 DIAGNOSIS — G931 Anoxic brain damage, not elsewhere classified: Secondary | ICD-10-CM | POA: Diagnosis not present

## 2020-03-04 DIAGNOSIS — G8250 Quadriplegia, unspecified: Secondary | ICD-10-CM | POA: Insufficient documentation

## 2020-03-04 DIAGNOSIS — S0990XA Unspecified injury of head, initial encounter: Secondary | ICD-10-CM | POA: Diagnosis present

## 2020-03-04 DIAGNOSIS — W06XXXA Fall from bed, initial encounter: Secondary | ICD-10-CM | POA: Insufficient documentation

## 2020-03-04 DIAGNOSIS — I252 Old myocardial infarction: Secondary | ICD-10-CM | POA: Insufficient documentation

## 2020-03-04 MED ORDER — LIDOCAINE HCL (PF) 1 % IJ SOLN
10.0000 mL | Freq: Once | INTRAMUSCULAR | Status: AC
  Administered 2020-03-04: 10 mL
  Filled 2020-03-04: qty 30

## 2020-03-04 NOTE — ED Provider Notes (Signed)
LACERATION REPAIR Performed by: Langston Masker Authorized by: Langston Masker Consent: Verbal consent obtained. Risks and benefits: risks, benefits and alternatives were discussed Consent given by: patient Patient identity confirmed: provided demographic data Prepped and Draped in normal sterile fashion Wound explored  Laceration Location: eyelid  Laceration Length: 1.5cm  No Foreign Bodies seen or palpated  Anesthesia: local infiltration  Local anesthetic: lidocaine   Anesthetic total: 3 ml  Irrigation method: syringe Amount of cleaning: standard  Skin closure: interuppted  Number of sutures: 3  Technique: simple  Patient tolerance: Patient tolerated the procedure well with no immediate complications.    [name], [place] 03/04/20 1551    Bethann Berkshire, MD 03/05/20 [phone]

## 2020-03-04 NOTE — ED Provider Notes (Signed)
[facility]-EMERGENCY DEPT Provider Note   CSN: [id number] Arrival date & time: 03/04/20  1301     History Chief Complaint  Patient presents with  . Fall    [name] is a 54 y.o. male.  Patient had a fall today.  Patient has a history of anoxic head injury and cannot speak.  The fall was not witnessed.  The history is provided by the nursing home. No language interpreter was used.  Fall This is a new problem. The current episode started 6 to 12 hours ago. The problem occurs every several days. The problem has not changed since onset.Pertinent negatives include no chest pain. Nothing aggravates the symptoms. Nothing relieves the symptoms.       Past Medical History:  Diagnosis Date  . Addison disease ([facility])   . Anoxic brain injury ([facility])   . Anxiety   . Aspiration pneumonia ([facility])   . Atrial fibrillation ([facility])   . Chronic constipation 11/23/2011  . Dementia ([facility])   . Dysphagia   . Encephalopathy   . Glaucoma   . Glucocorticoid deficiency ([facility])   . History of recurrent UTIs   . Hyperlipemia   . Hypothyroidism   . Mental disorder   . Myocardial infarction ([facility])   . Pneumoperitoneum of unknown etiology 12/21/2011  . Quadriplegia (H[facility])   . Reflux   . Seizure disorder [facility])     Patient Active Problem List   Diagnosis Date Noted  . PEG Gastrostomy tube in place (H[facility]) 01/26/2018  . 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 01/26/2018 01/26/2018  . Bedridden 01/26/2018  . Quadriplegia ([facility])   . Abdominal wall abscess 01/25/2018  . Fracture of humerus anatomical neck, right, closed, initial encounter 10/03/2017  . AKI (acute kidney injury) ([facility]) 10/03/2017  . UTI (urinary tract infection) 10/03/2017  . Agitation 07/03/2014  . Cardiomyopathy, ischemic 06/18/2014  . 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 Acute psychosis (H[facility]) 04/19/2014  . Unspecified hypothyroidism 05/16/2013  . Constipation 05/16/2013  . Anemia of other chronic  disease 05/16/2013  . Mental disorder 01/03/2013  . Addisons disease (H[facility]) 01/03/2013  . PEG (percutaneous endoscopic gastrostomy) adjustment/replacement/removal (H[facility]) 12/21/2011  . Hypokalemia 11/25/2011  . Thrombocytopenia (HCC) 11/25/2011  . FTT (failure to thrive) in adult 11/25/2011  . Grand mal seizure (HCC) 11/23/2011  . Depression (emotion) 11/23/2011  . History of atrial fibrillation 11/23/2011  . Other specified hemorrhagic conditions (HCC) 11/23/2011  . Glaucoma 11/23/2011  . Hypoglycemia 11/23/2011  . Chronic constipation 11/23/2011  . Anoxic brain injury (HCC) 11/12/2011  . Protein calorie malnutrition (HCC) 11/12/2011  . Hyperchloremia 11/12/2011  . Hyperthyroidism 11/12/2011  . Acute lower UTI 11/12/2011  . Dysphagia     Past Surgical History:  Procedure Laterality Date  . DEBRIDEMENT OF ABDOMINAL WALL ABSCESS  01/26/2018  . HERNIA MESH REMOVAL  01/26/2018   Infected mesh - removed w I&D abd wall abscess  . IR GENERIC HISTORICAL  10/20/2016   [facility] [place] GASTRO/COLONIC TUBE PERCUT W/FLUORO 10/20/2016 Oley Balm, MD [facility]-INTER[facility] RAD  . IR REPLACE G-TUBE SIMPLE WO FLUORO  01/28/2018  . IR [facility]PLC GASTRO/COLONIC TUBE PERCUT W/FLUORO  04/21/2017  . IR REPLC GASTRO/COLONIC TUBE PERCUT W/FLUORO  07/22/2017  . IR REPLC GASTRO/COLONIC TUBE PERCUT W/FLUORO  11/24/2017  . IRRIGATION AND DEBRIDEMENT ABSCESS N/A 01/26/2018   Procedure: IRRIGATION AND DEBRIDEMENT ABDOMINAL WALL ABSCESS WITH REMOVAL OF INFECTED MESH;  Surgeon: Karie Soda, MD;  Location: [facility] ORS;  Service: General;  Laterality: N/A;  . NEPHRECTOMY  unknown  . PEG TUBE PLACEMENT         History reviewed. No pertinent family history.  Social History   Tobacco Use  . Smoking status: Never Smoker  . Smokeless tobacco: Never Used  Substance Use Topics  . Alcohol use: No  . Drug use: No    Home Medications Prior to Admission medications   Medication Sig Start Date End Date Taking? Authorizing Provider    acetaminophen (TYLENOL) 325 MG tablet Place 650 mg into feeding tube every 4 (four) hours as needed for mild pain.   Yes [provider]  Amino Acids-Protein Hydrolys (FEEDING SUPPLEMENT, PRO-STAT SUGAR FREE 64,) LIQD Place 30 mLs into feeding tube 2 (two) times daily.    Yes [provider]  aspirin 81 MG chewable tablet Chew 81 mg by mouth daily.   Yes [provider]  Carboxymeth-Glycerin-Polysorb (REFRESH OPTIVE ADVANCED OP) Place 1 drop into both eyes 3 (three) times daily.   Yes [provider]  divalproex (DEPAKOTE SPRINKLE) 125 MG capsule Take 500 mg by mouth 2 (two) times daily.  07/18/13  Yes [provider]  levothyroxine (SYNTHROID, LEVOTHROID) 150 MCG tablet Place 150 mcg into feeding tube daily before breakfast.    Yes [provider]  metoprolol tartrate (LOPRESSOR) 25 MG tablet Take 12.5 mg by mouth 2 (two) times daily.   Yes [provider]  Multiple Vitamins-Minerals (CERTAVITE/ANTIOXIDANTS) LIQD 15 mLs by PEG Tube route daily at 12 noon.    Yes [provider]  pantoprazole sodium (PROTONIX) 40 mg/20 mL PACK 40 mg by PEG Tube route daily at 12 noon.  11/30/11  Yes Ernest Haber, MD  PARoxetine (PAXIL) 30 MG tablet Take 15 mg by mouth daily.   Yes [provider]  polyethylene glycol (MIRALAX / GLYCOLAX) packet 17 g by PEG Tube route 2 (two) times daily. Mix 17gm  In 4-8 ounces of liquid   Yes [provider]  polyvinyl alcohol (LIQUIFILM TEARS) 1.4 % ophthalmic solution Place 1 drop into both eyes 2 (two) times daily.   Yes [provider]  risperiDONE (RISPERDAL M-TABS) 1 MG disintegrating tablet Place 1 mg under the tongue 3 (three) times daily.  05/08/17  Yes [provider]  sennosides (SENOKOT) 8.8 MG/5ML syrup Place 15 mLs into feeding tube 2 (two) times daily.    Yes [provider]  traZODone (DESYREL) 150 MG tablet Take 75 mg by mouth at bedtime.  08/02/13  Yes  [provider]    Allergies    Ativan [lorazepam], Latex, Morphine and related, Penicillins, Pyridium [phenazopyridine hcl], and Soy allergy  Review of Systems   Review of Systems  Unable to perform ROS: Other  Cardiovascular: Negative for chest pain.    Physical Exam Updated Vital Signs BP 120/74 (BP Location: Right Arm)   Pulse 72   Temp 99.1 F (37.3 C) (Oral)   Resp 17   SpO2 96%   Physical Exam Vitals and nursing note reviewed.  Constitutional:      Appearance: He is well-developed.  HENT:     Head: Normocephalic.     Comments: 2 cm laceration to left eyebrow    Nose: Nose normal.  Eyes:     General: No scleral icterus.    Conjunctiva/sclera: Conjunctivae normal.  Neck:     Thyroid: No thyromegaly.  Cardiovascular:     Rate and Rhythm: Normal rate and regular rhythm.     Heart sounds: No murmur. No friction rub. No  gallop.   Pulmonary:     Breath sounds: No stridor. No wheezing or rales.  Chest:     Chest wall: No tenderness.  Abdominal:     General: There is no distension.     Tenderness: There is no abdominal tenderness. There is no rebound.  Musculoskeletal:        General: No swelling.     Cervical back: Neck supple.  Lymphadenopathy:     Cervical: No cervical adenopathy.  Skin:    Findings: No erythema or rash.  Neurological:     Mental Status: He is alert.     Motor: No abnormal muscle tone.     Coordination: Coordination normal.     Comments: Patient alert and nonverbal     ED Results / Procedures / Treatments   Labs (all labs ordered are listed, but only abnormal results are displayed) Labs Reviewed - No data to display  EKG None  Radiology No results found.  Procedures Procedures (including critical care time)  Medications Ordered in ED Medications - No data to display  ED Course  I have reviewed the triage vital signs and the nursing notes.  Pertinent labs & imaging results that were available during my care of  the patient were reviewed by me and considered in my medical decision making (see chart for details).    MDM Rules/Calculators/A&P                     Patient with a head injury.  CT scan was negative.  Patient has 1.5 cm laceration to left forehead.  Laceration was sutured by Trisha Mangle, PA Final Clinical Impression(s) / ED Diagnoses Final diagnoses:  None    Rx / DC Orders ED Discharge Orders    None       Bethann Berkshire, MD 03/05/20 [phone]

## 2020-03-04 NOTE — ED Triage Notes (Signed)
Patient is from [facility] where he fell from his bed and hit his head. He has a laceration over his left eye. Currently he is at baseline.  HX TBI  EMS vitals: 153/126 BP 88 HR 95% O2 sat on room air 18 Resp Rate 98.5 Temp

## 2020-03-04 NOTE — Discharge Instructions (Addendum)
Return if any problems.  Sutures will dissolve

## 2020-03-04 NOTE — ED Notes (Signed)
   03/04/20 1720  What Happened  Was fall witnessed? No  Patients activity before fall other (comment) (sitting in chair)  Point of contact buttocks;arm/shoulder  Was patient injured? No  Patient found on floor  Found by Staff-comment  Stated prior activity to/from bed, chair, or stretcher  Follow Up  MD notified Pfeiffer MD  Time MD notified 1725  Additional tests No  Progress note created (see row info) Yes  Adult Fall Risk Assessment  Risk Factor Category (scoring not indicated) High fall risk per protocol (document High fall risk)  Patient Fall Risk Level High fall risk  Adult Fall Risk Interventions  Required Bundle Interventions *See Row Information* High fall risk - low, moderate, and high requirements implemented  Additional Interventions Room near nurses station  Vitals  Temp 98.7 F (37.1 C)  Temp Source Oral  BP 137/83  MAP (mmHg) 98  Pulse Rate 98  Resp 18  Oxygen Therapy  SpO2 95 %  Neurological  Neuro (WDL) WDL (At baseline)  Level of Consciousness Alert  Musculoskeletal  Musculoskeletal (WDL) WDL  Integumentary  Integumentary (WDL) X  Skin Integrity Other (Comment) (laceration from previous fall)

## 2020-03-22 ENCOUNTER — Other Ambulatory Visit (INDEPENDENT_AMBULATORY_CARE_PROVIDER_SITE_OTHER): Payer: Self-pay

## 2020-03-27 IMAGING — CT CT T SPINE W/O CM
3 of 4 series · 10 of 33 positions shown, 11 images · non-contrast
Comparison: None.

CLINICAL DATA: Trauma, evaluation for T12 fracture

EXAM:
CT THORACIC SPINE WITHOUT CONTRAST
TECHNIQUE: Multidetector CT images of the thoracic were obtained using the
standard protocol without intravenous contrast.

[Series 4: t-spine 2.0 st · axial · 0.37mm/px · z∈[-582,-458]mm · 2 of 184 slices shown, 3 images]
[im 62/184  soft-tissue]
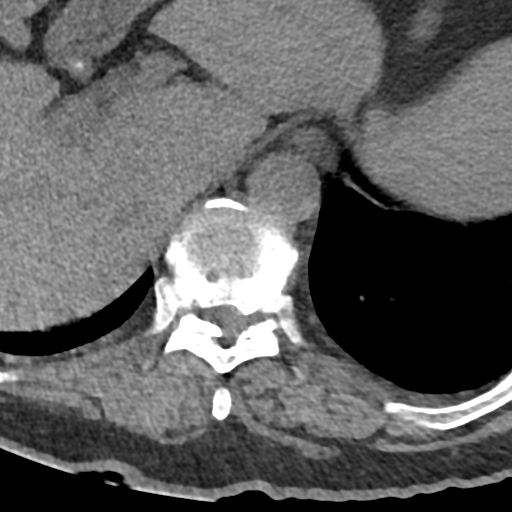
[im 62/184  bone]
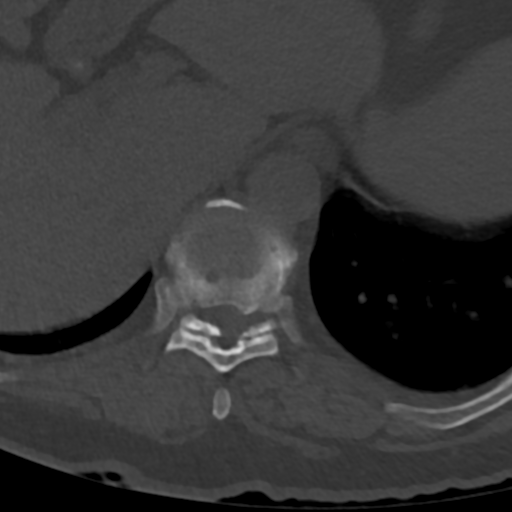
[im 123/184  bone]
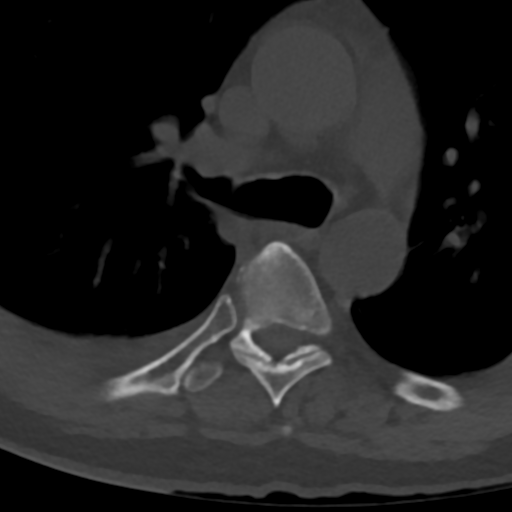

[Series 6: t-spine 2.0 cor bone · coronal · 0.36mm/px · 3 of 79 slices shown]
[im 16/79  bone]
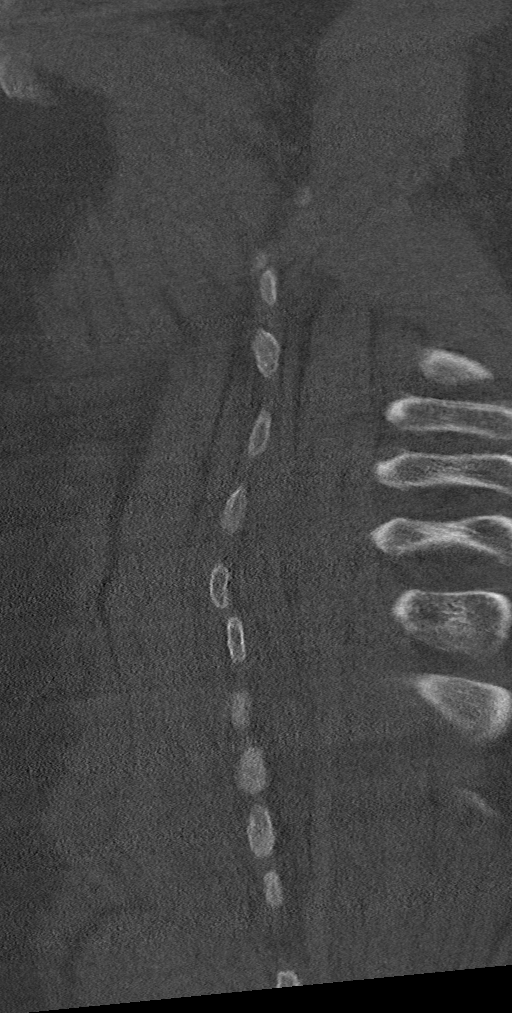
[im 32/79  bone]
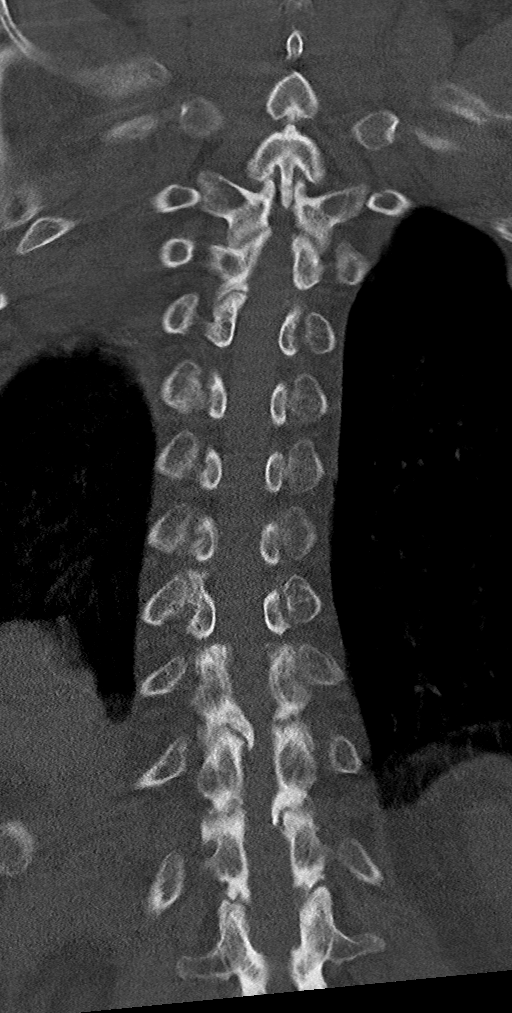
[im 47/79  bone]
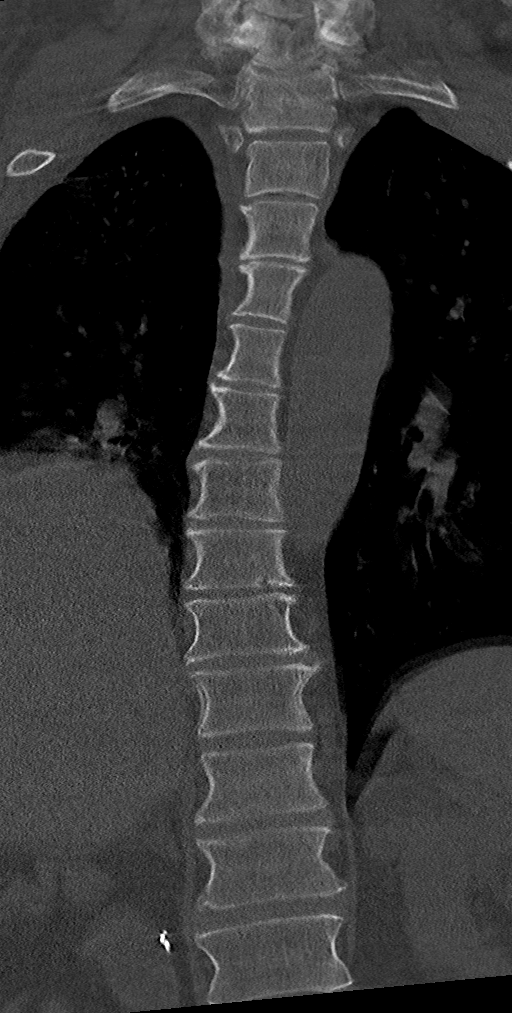

[Series 7: t-spine 2.0 sag bone · sagittal · 0.32mm/px · 5 of 62 slices shown]
[im 21/62  bone]
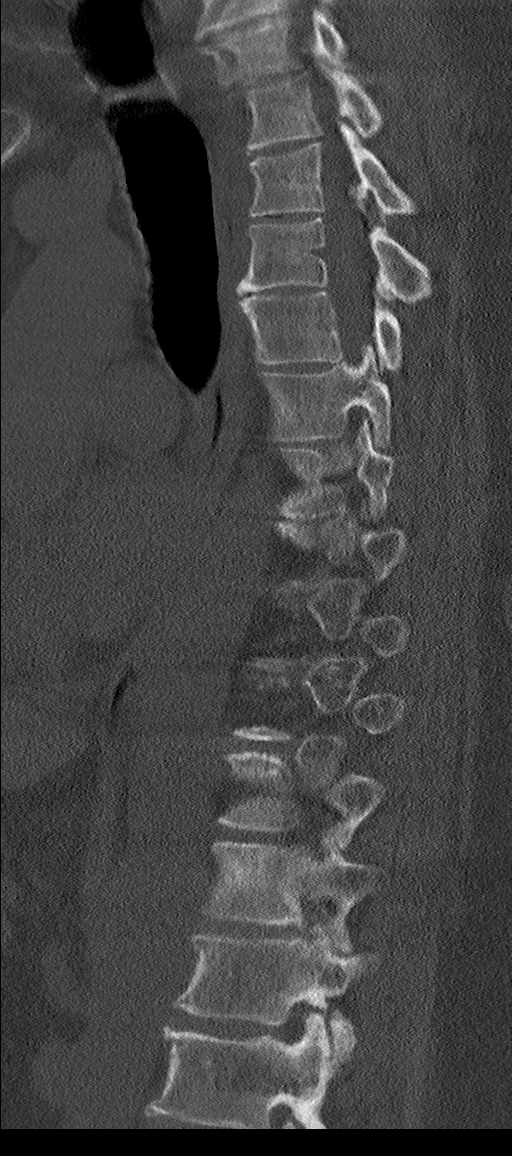
[im 26/62  bone]
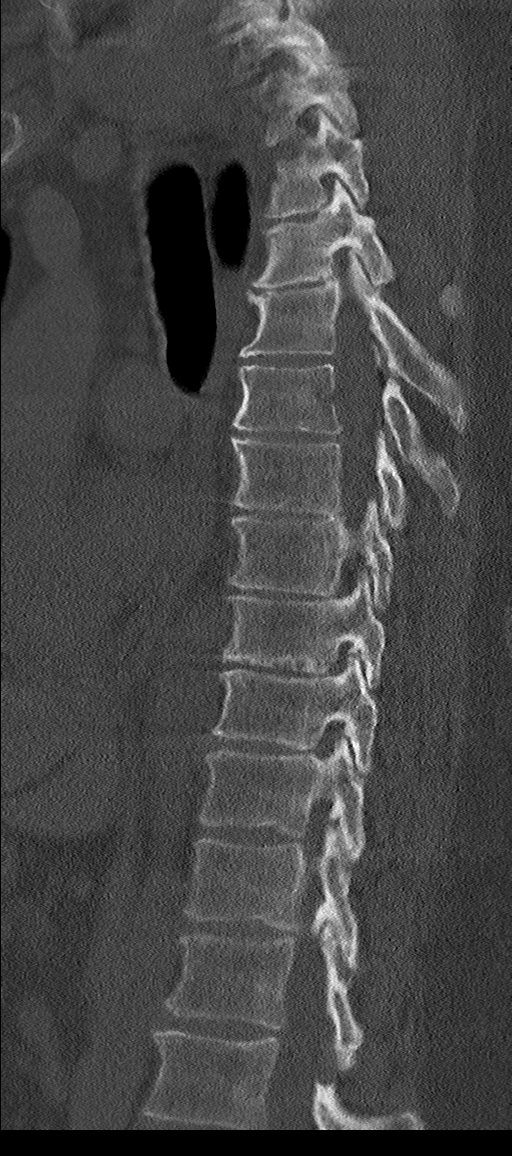
[im 31/62  bone]
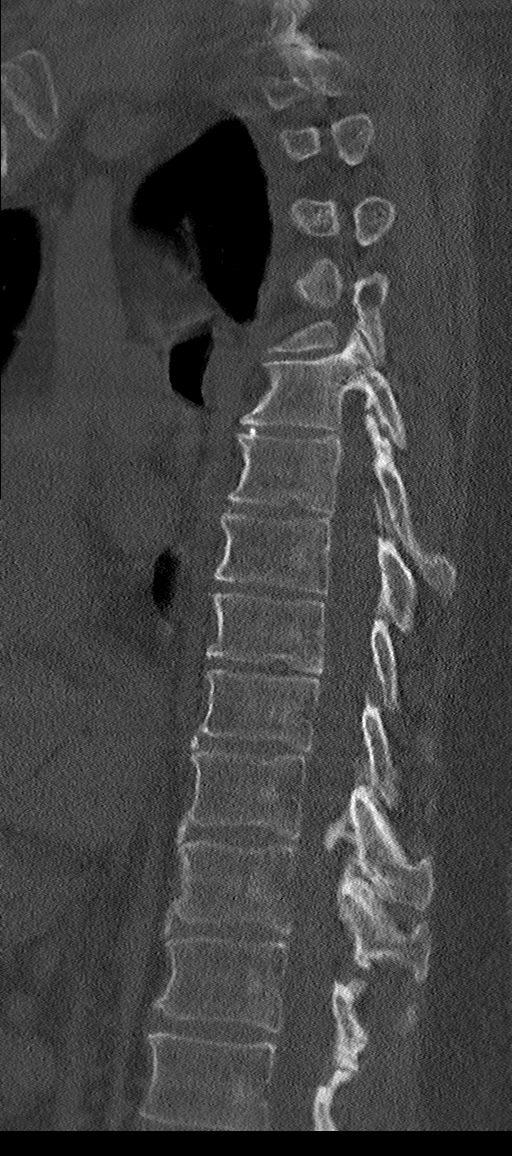
[im 36/62  bone]
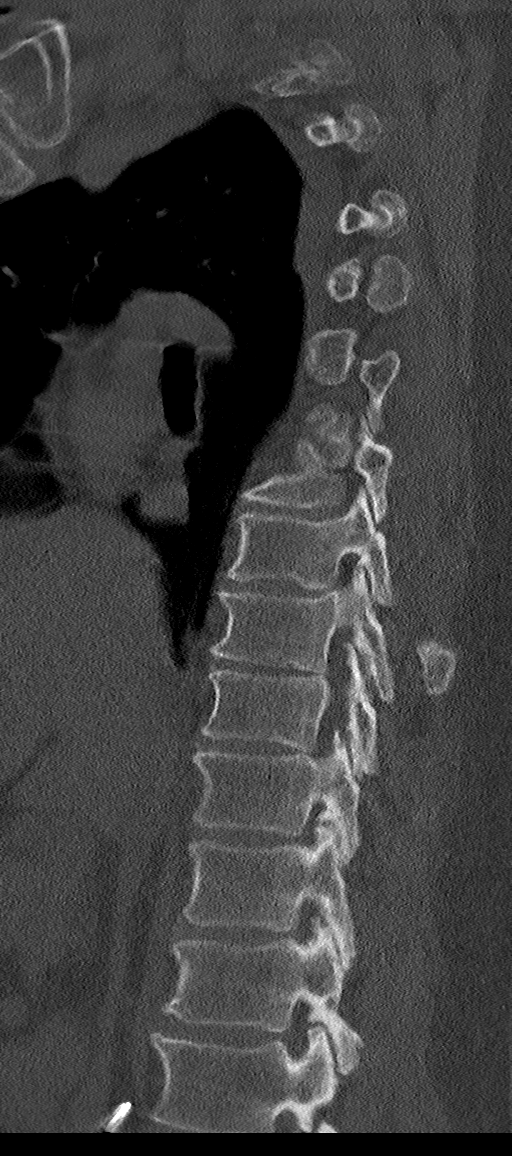
[im 41/62  bone]
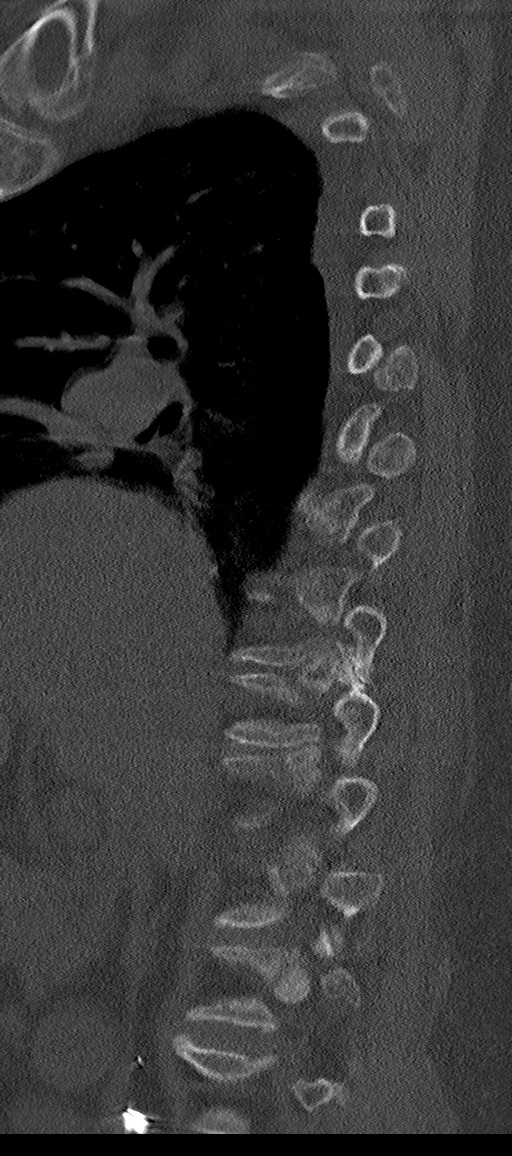

[10 of 33 positions shown; findings below may reference images not displayed]

FINDINGS: Alignment: Dextroscoliosis of the thoracic spine. No static
listhesis.

Vertebrae: No acute fracture or focal pathologic process.

Paraspinal and other soft tissues: No acute paraspinal abnormality.
Prior right nephrectomy. Thoracic aortic atherosclerosis.

Disc levels: Degenerative disease with disc height loss at T7-8 and
T8-9. Broad-based disc osteophyte complex at T8-9 with bilateral
foraminal narrowing. Right foraminal narrowing at T10-11. Left
foraminal stenosis at T11-12.
IMPRESSION: No acute osseous injury of the thoracic spine.

Aortic Atherosclerosis ([id number]).

## 2020-03-27 IMAGING — CT CT HEAD W/O CM
4 series · 16 of 47 positions shown, 18 images · non-contrast
Comparison: CT brain and cervical spine 12/05/2019

CLINICAL DATA: Head and neck trauma

EXAM:
CT HEAD WITHOUT CONTRAST
CT CERVICAL SPINE WITHOUT CONTRAST
TECHNIQUE: Multidetector CT imaging of the head and cervical spine was
performed following the standard protocol without intravenous
contrast. Multiplanar CT image reconstructions of the cervical spine
were also generated.

[Series 3: head without · axial · non-contrast · 0.45mm/px · z∈[-211,-86]mm · 6 of 37 slices shown, 8 images]
[im 6/37  brain]
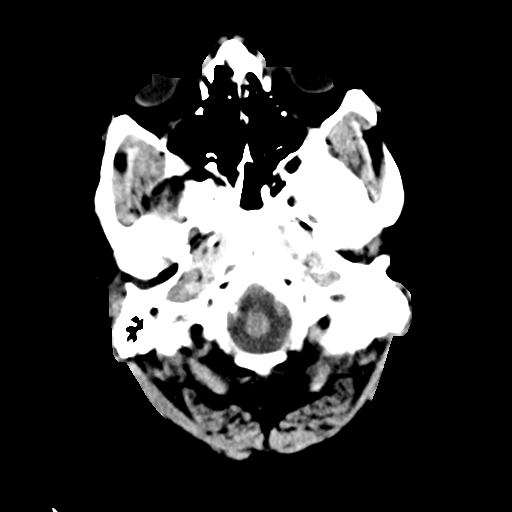
[im 6/37  bone]
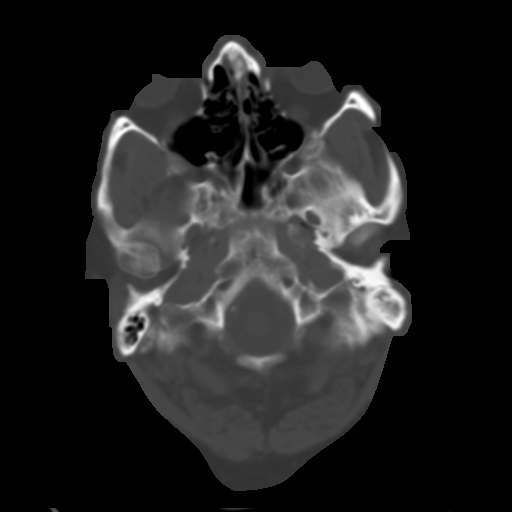
[im 11/37  brain]
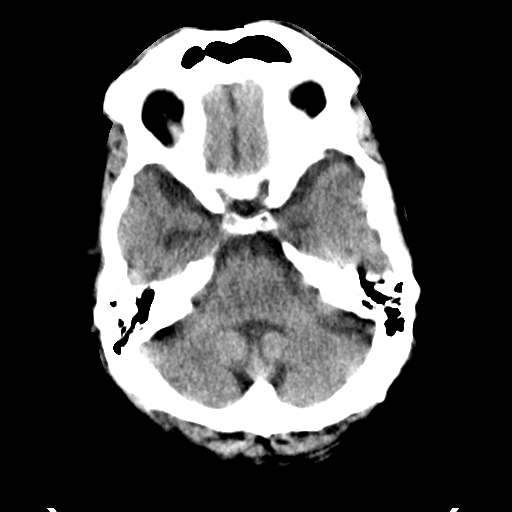
[im 16/37  brain]
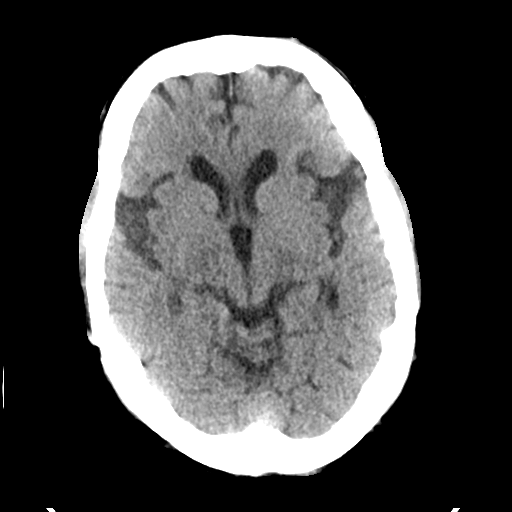
[im 21/37  brain]
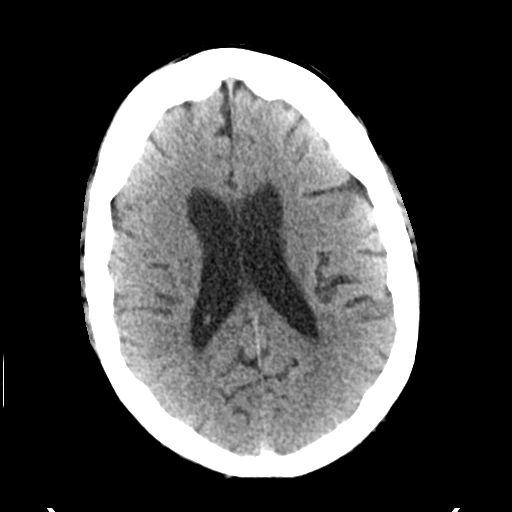
[im 26/37  brain]
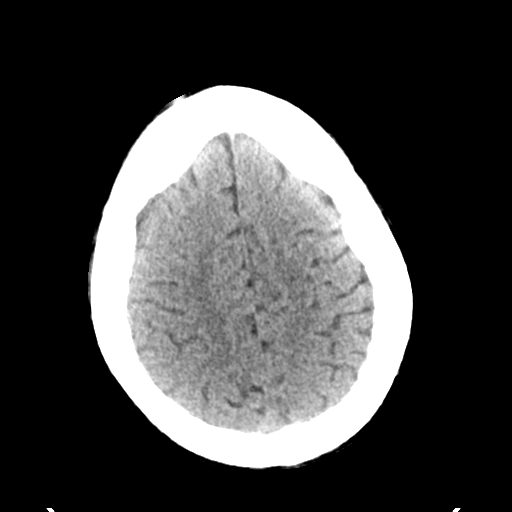
[im 26/37  bone]
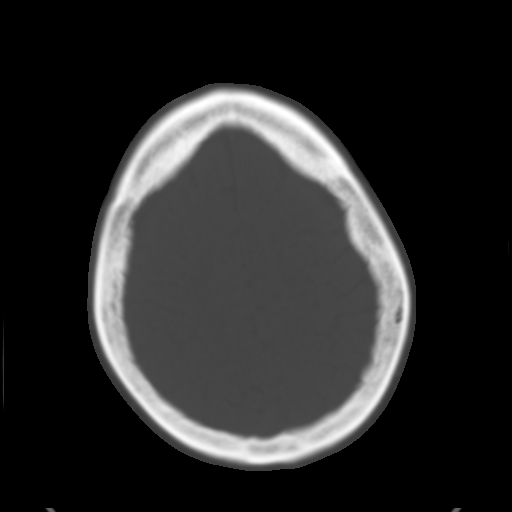
[im 31/37  brain]
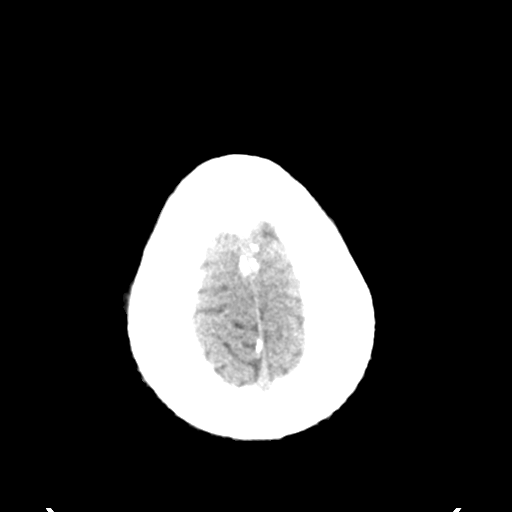

[Series 4: head bone · axial · 0.45mm/px · z∈[-220,-158]mm · 4 of 94 slices shown]
[im 9/94  bone]
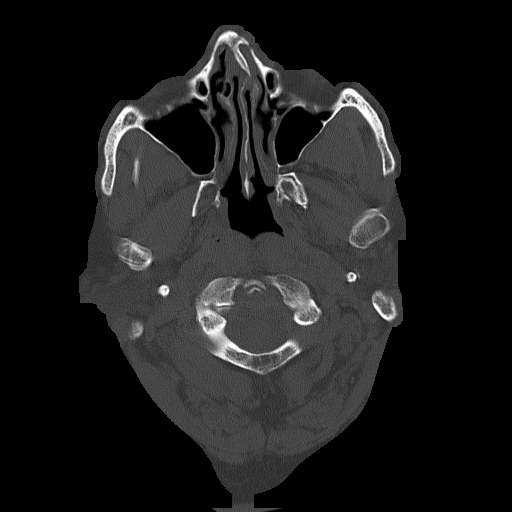
[im 18/94  bone]
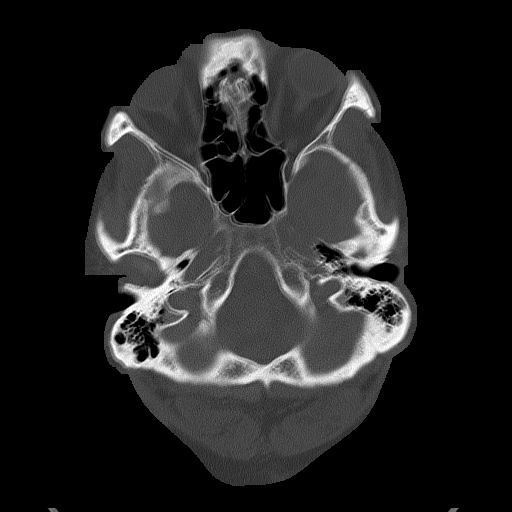
[im 32/94  bone]
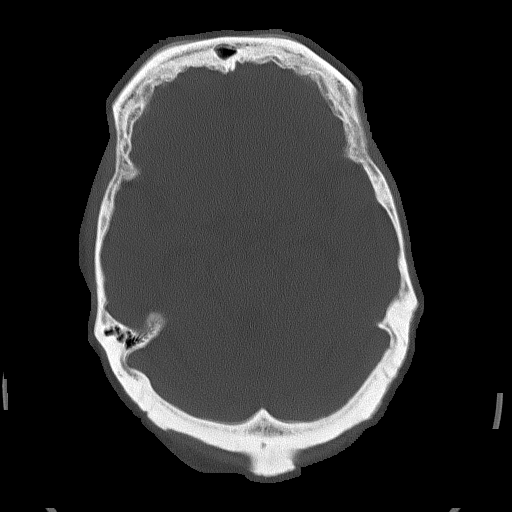
[im 40/94  bone]
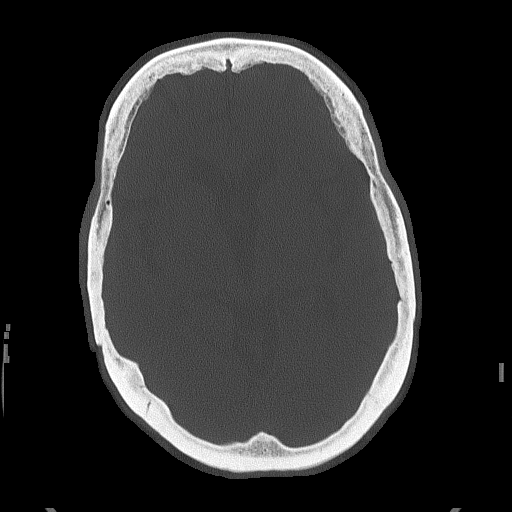

[Series 5: head without cor · coronal · non-contrast · 0.37mm/px · 3 of 74 slices shown]
[im 25/74  brain]
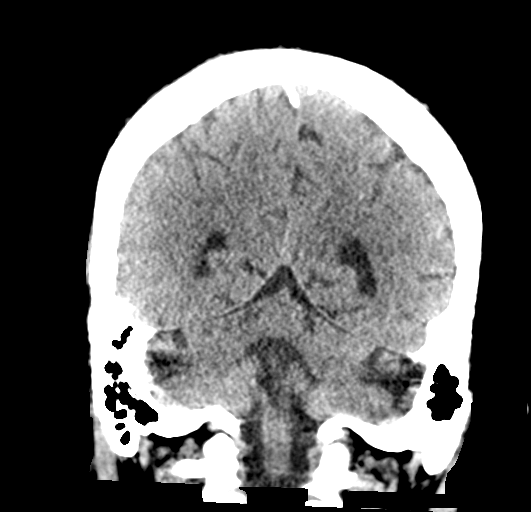
[im 33/74  brain]
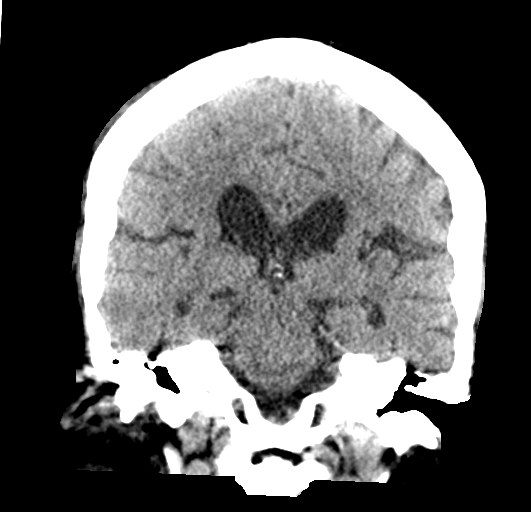
[im 41/74  brain]
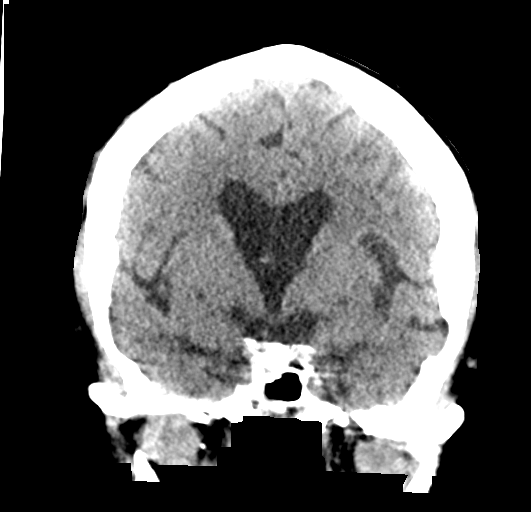

[Series 6: head without sag · sagittal · non-contrast · 0.37mm/px · 3 of 65 slices shown]
[im 22/65  brain]
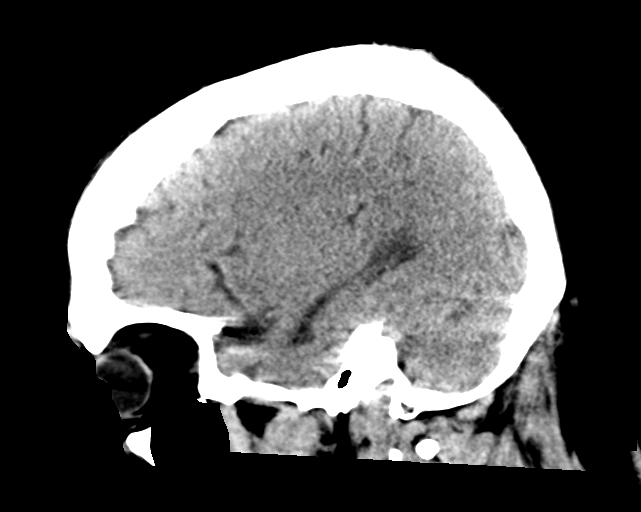
[im 33/65  brain]
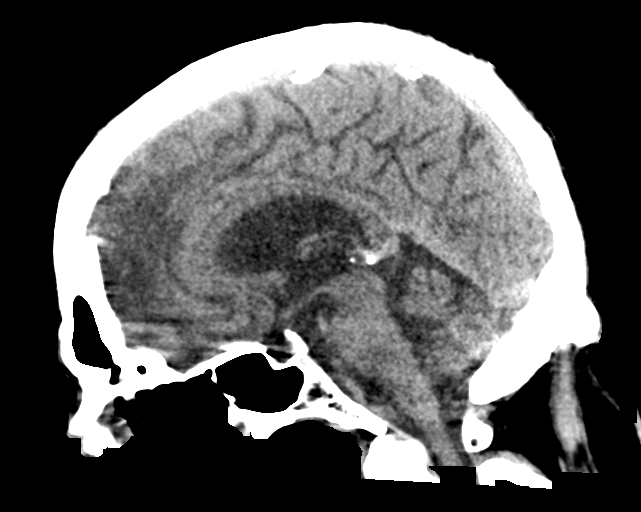
[im 43/65  brain]
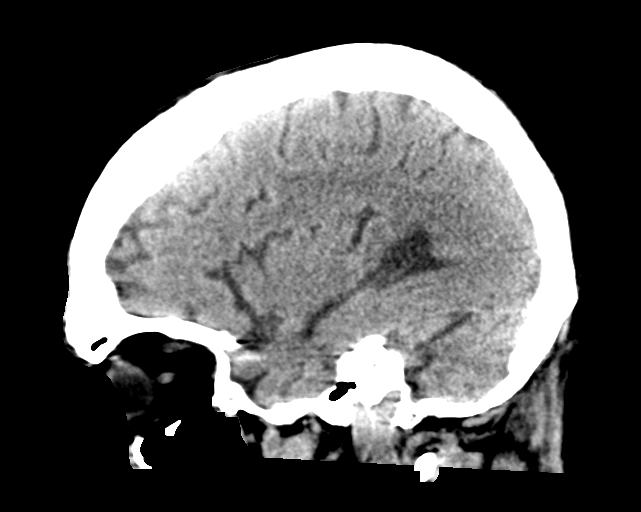

[16 of 47 positions shown; findings below may reference images not displayed]

FINDINGS: CT HEAD FINDINGS

Brain: No acute territorial infarction, hemorrhage, or intracranial
mass. Motion degradation. Stable ventricle size. Minimal white
matter hypodensity.

Vascular: No hyperdense vessels.  No unexpected calcification

Skull: Normal. Negative for fracture or focal lesion.

Sinuses/Orbits: Chronic nasal bone deformity. Mild sinus mucosal
disease

Other: Small right parietal scalp swelling.

CT CERVICAL SPINE FINDINGS

Alignment: Reversal of cervical lordosis. Facet alignment is
maintained

Skull base and vertebrae: No acute fracture. No primary bone lesion
or focal pathologic process.

Soft tissues and spinal canal: No prevertebral fluid or swelling. No
visible canal hematoma.

Disc levels: Moderate severe diffuse degenerative change throughout
the cervical spine, most advanced at C4-C5, C5-C6 and C6-C7.
Multiple level disc space narrowing and osteophyte. Facet
degenerative changes at multiple levels with right greater than left
foraminal narrowing at multiple levels.

Upper chest: Negative.

Other: None
IMPRESSION: 1. Motion degradation limits evaluation of the brain. No definite CT
evidence for acute intracranial abnormality.
2. Reversal of cervical lordosis with degenerative changes. No acute
osseous abnormality

## 2020-03-27 IMAGING — CR DG LUMBAR SPINE COMPLETE 4+V
5 series · 5 of 5 positions shown · non-contrast
Comparison: July 04, 2019

CLINICAL DATA: Pain following fall

EXAM:
LUMBAR SPINE - COMPLETE 4+ VIEW

[l-spine ap]
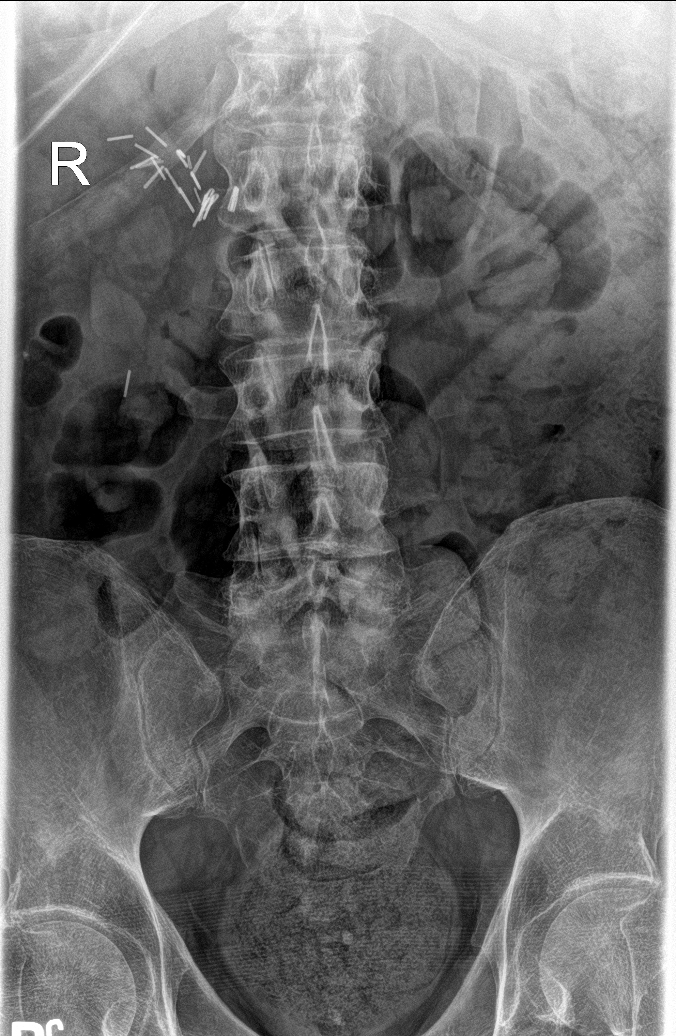

[l-spine obl (1 of 2)]
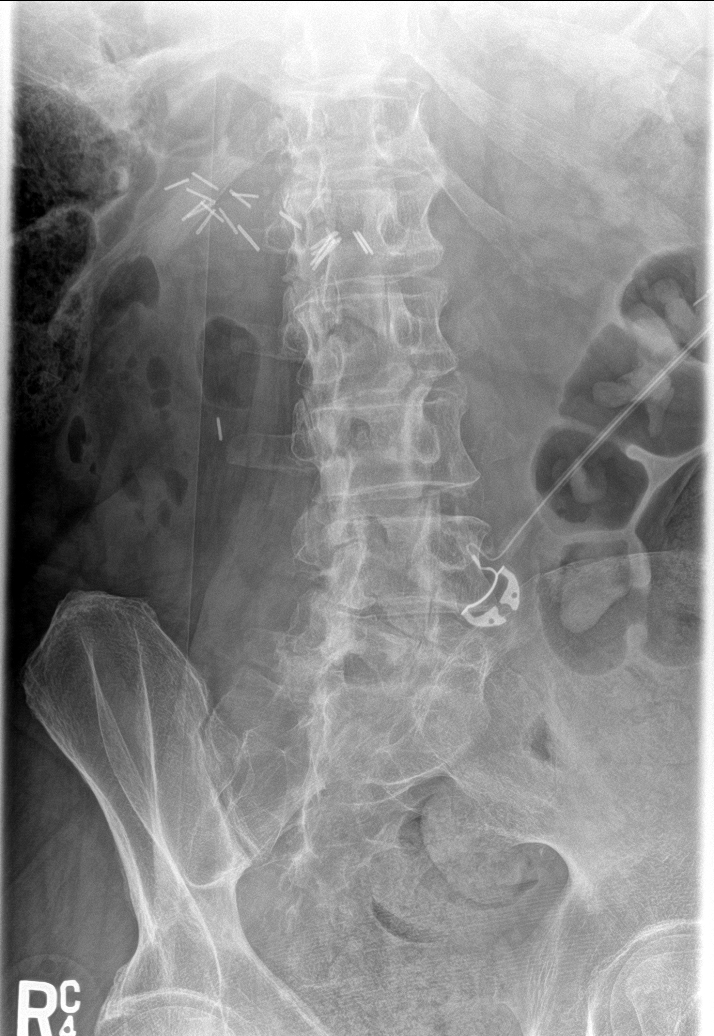

[l-spine obl (2 of 2)]
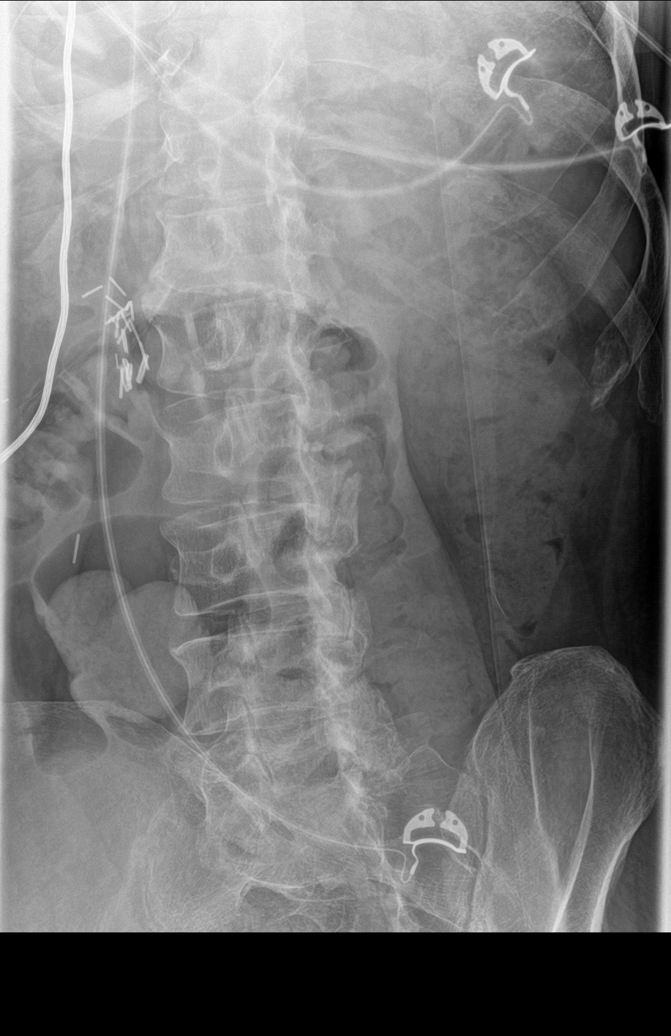

[l-spine lat (1 of 2)]
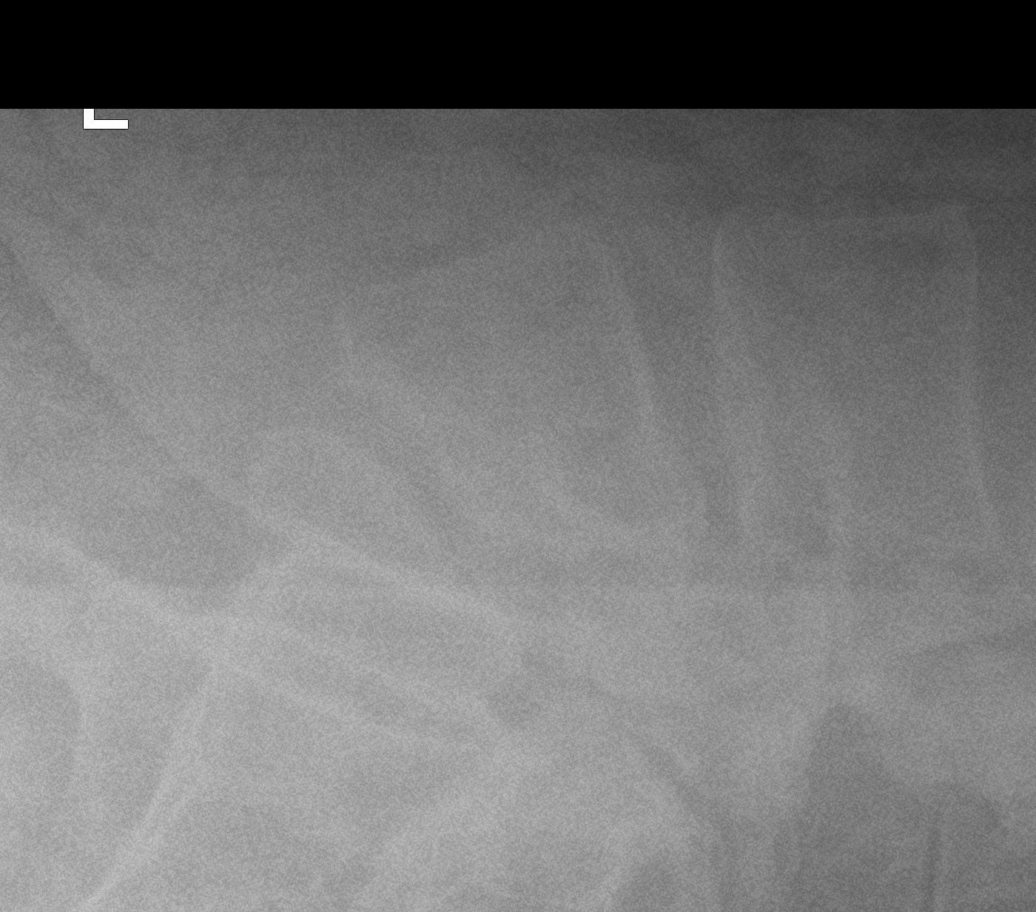

[l-spine lat (2 of 2)]
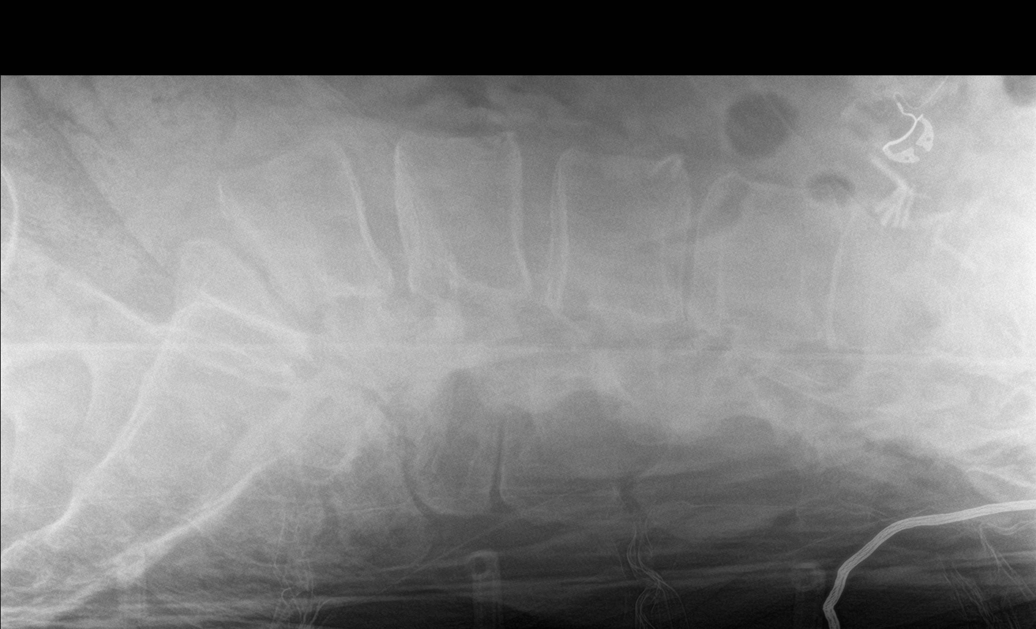

[5 of 5 positions shown; findings below may reference images not displayed]

FINDINGS: Frontal, lateral, spot lumbosacral lateral, and bilateral oblique
views were obtained. There are 5 non-rib-bearing lumbar type
vertebral bodies. There is a questionable compression fracture at
T12, incompletely visualized. No appreciable lumbar fracture
evident. There is 11 mm of anterolisthesis of L5 on S1 with pars
defects evident at L5 bilaterally. No other spondylolisthesis. There
is moderately severe disc space narrowing at L5-S1. Other disc
spaces appear unremarkable. There is facet osteoarthritic change at
L5-S1 bilaterally. There are surgical clips in the upper abdomen on
the right.
IMPRESSION: 1. Questionable fracture of the T12 vertebral body. Note that T12 is
incompletely visualized. Imaging of the lower thoracic spine to
further evaluate T12 advised.

2. 11 mm of anterolisthesis of L5 on S1 with pars defects at L5
noted. Increased spondylolisthesis at L5-S1 compared to prior study.

3. Severe disc space narrowing at L5-S1 with bilateral facet
osteoarthritic change at L5-S1.

## 2020-03-27 IMAGING — CT CT CERVICAL SPINE W/O CM
3 of 4 series · 10 of 33 positions shown, 12 images · non-contrast
Comparison: CT brain and cervical spine 12/05/2019

CLINICAL DATA: Head and neck trauma

EXAM:
CT HEAD WITHOUT CONTRAST
CT CERVICAL SPINE WITHOUT CONTRAST
TECHNIQUE: Multidetector CT imaging of the head and cervical spine was
performed following the standard protocol without intravenous
contrast. Multiplanar CT image reconstructions of the cervical spine
were also generated.

[Series 4: c_spine 2.0 st · axial · 0.36mm/px · z∈[-341,-261]mm · 2 of 96 slices shown, 3 images]
[im 28/96  soft-tissue]
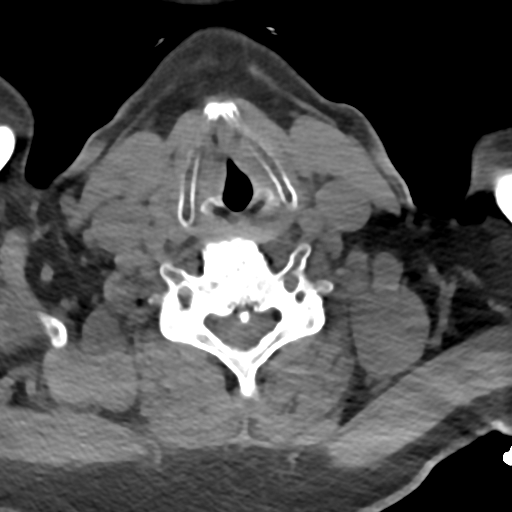
[im 28/96  bone]
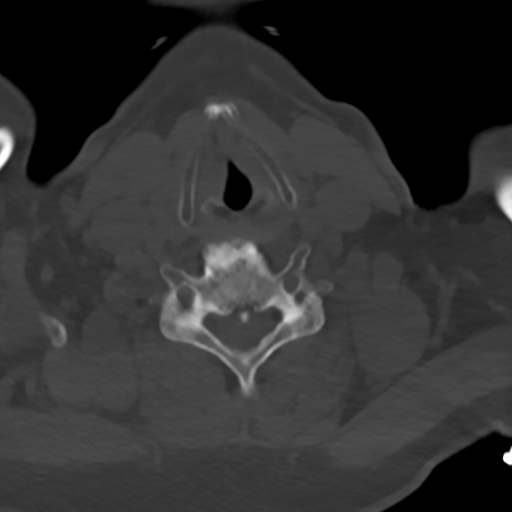
[im 68/96  bone]
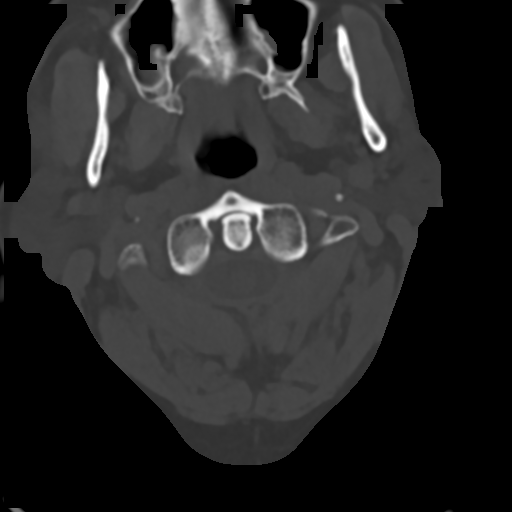

[Series 6: c_spine 2.0 sag bone · sagittal · 0.23mm/px · 5 of 61 slices shown, 6 images]
[im 21/61  bone]
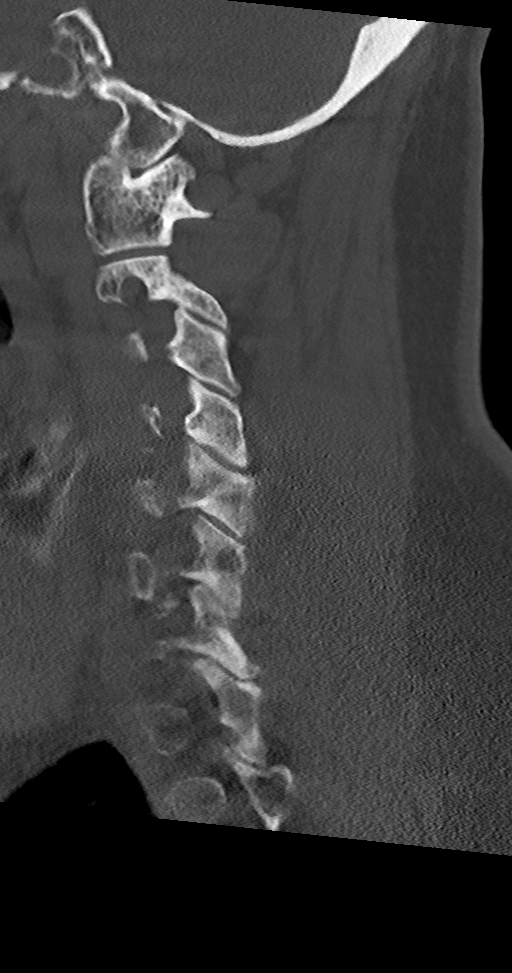
[im 26/61  bone]
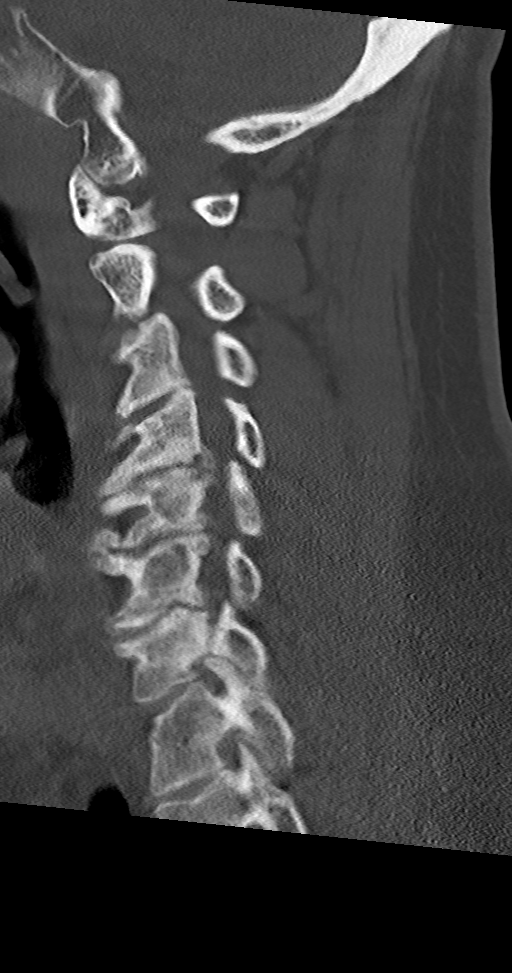
[im 31/61  soft-tissue]
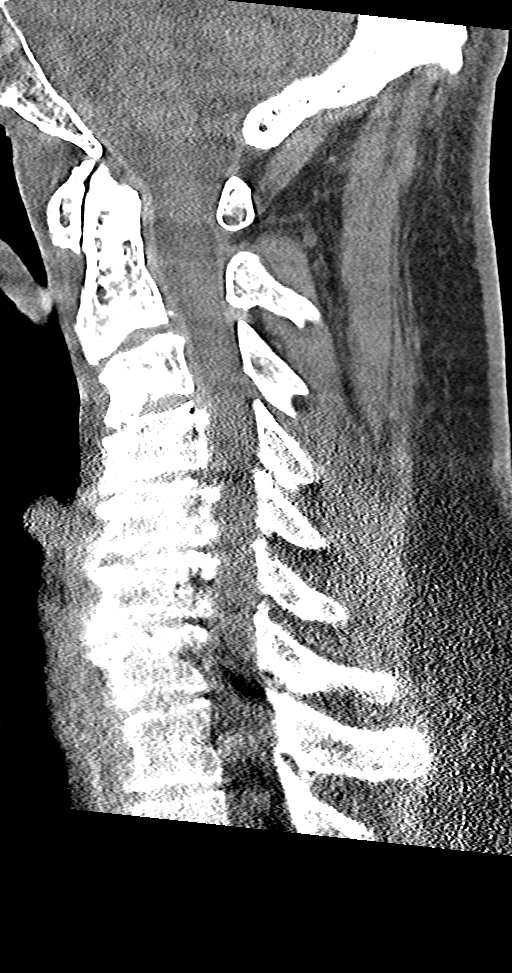
[im 31/61  bone]
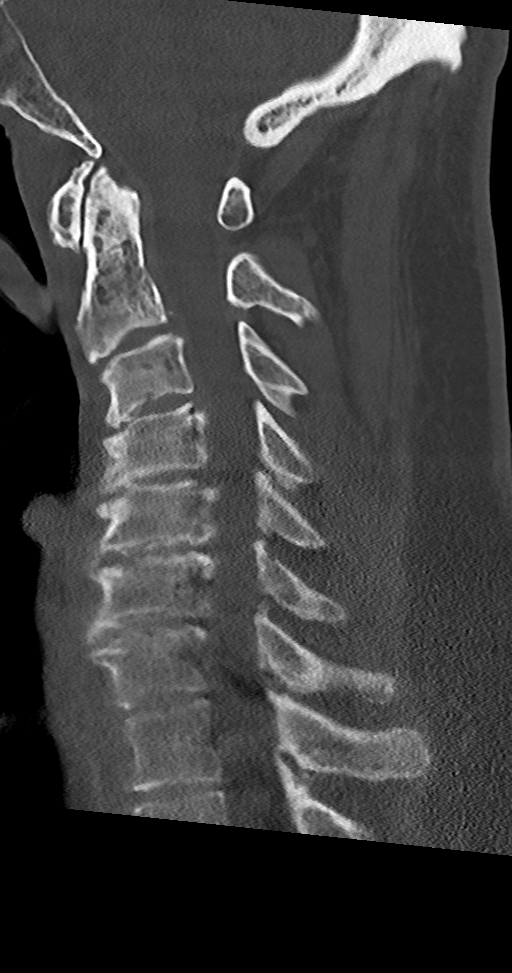
[im 36/61  bone]
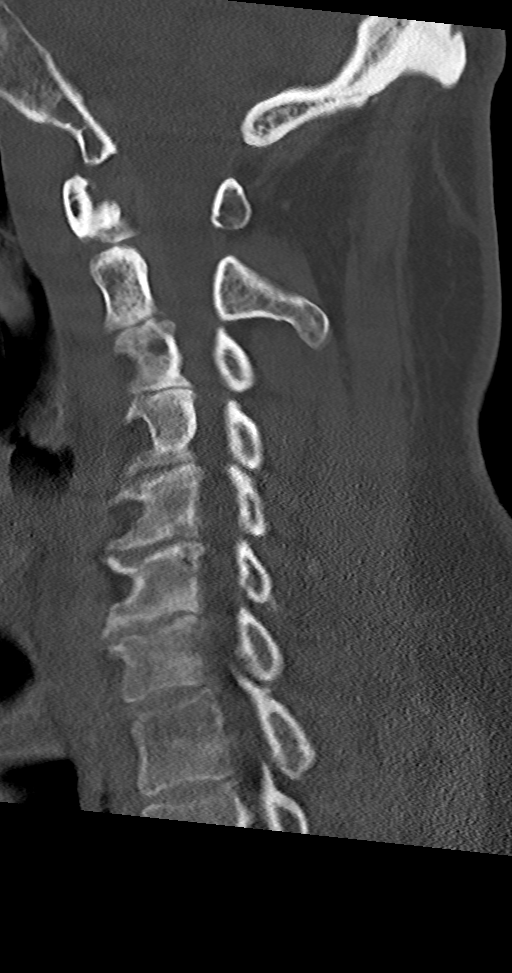
[im 41/61  bone]
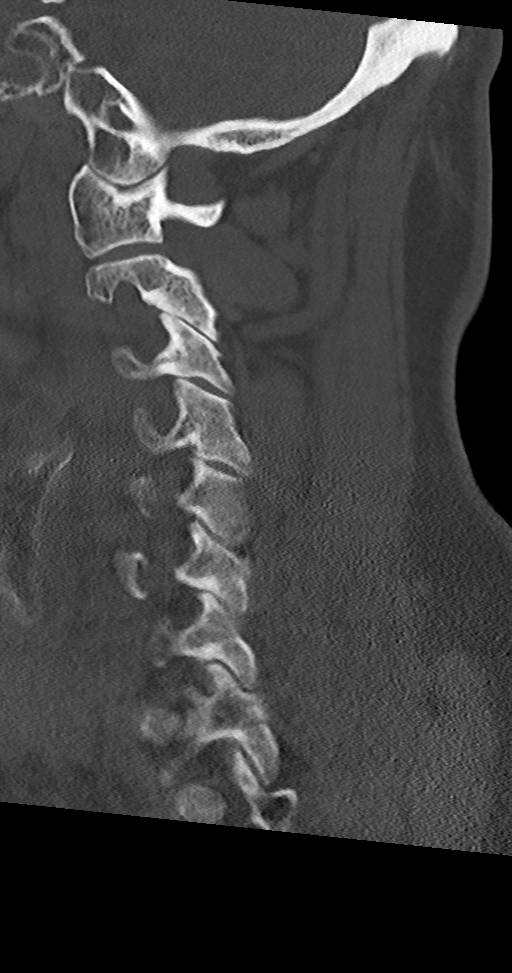

[Series 7: c_spine 2.0 cor bone · coronal · 0.26mm/px · 3 of 79 slices shown]
[im 16/79  bone]
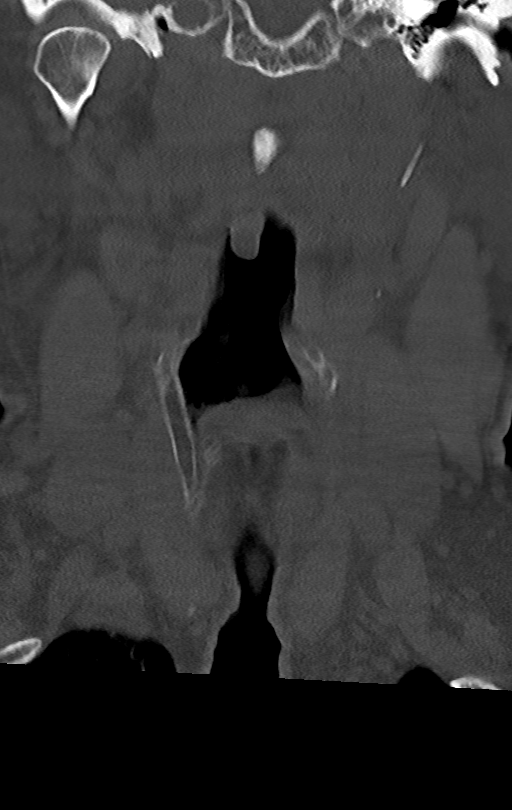
[im 32/79  bone]
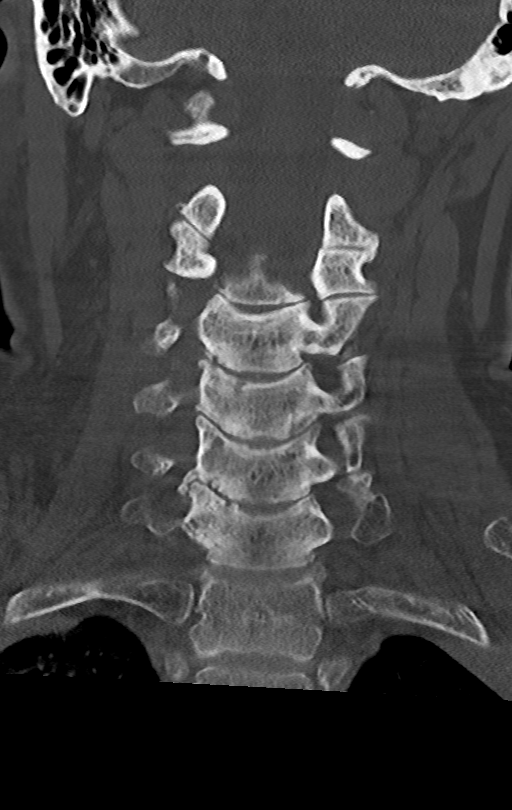
[im 47/79  bone]
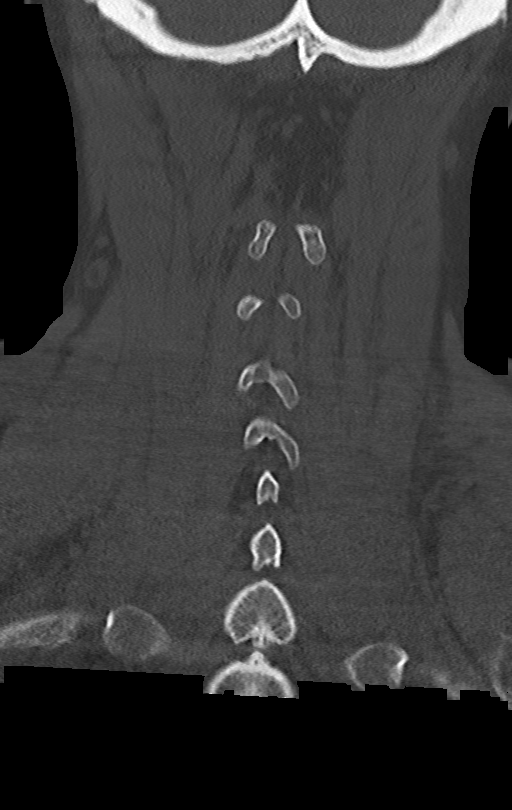

[10 of 33 positions shown; findings below may reference images not displayed]

FINDINGS: CT HEAD FINDINGS

Brain: No acute territorial infarction, hemorrhage, or intracranial
mass. Motion degradation. Stable ventricle size. Minimal white
matter hypodensity.

Vascular: No hyperdense vessels.  No unexpected calcification

Skull: Normal. Negative for fracture or focal lesion.

Sinuses/Orbits: Chronic nasal bone deformity. Mild sinus mucosal
disease

Other: Small right parietal scalp swelling.

CT CERVICAL SPINE FINDINGS

Alignment: Reversal of cervical lordosis. Facet alignment is
maintained

Skull base and vertebrae: No acute fracture. No primary bone lesion
or focal pathologic process.

Soft tissues and spinal canal: No prevertebral fluid or swelling. No
visible canal hematoma.

Disc levels: Moderate severe diffuse degenerative change throughout
the cervical spine, most advanced at C4-C5, C5-C6 and C6-C7.
Multiple level disc space narrowing and osteophyte. Facet
degenerative changes at multiple levels with right greater than left
foraminal narrowing at multiple levels.

Upper chest: Negative.

Other: None
IMPRESSION: 1. Motion degradation limits evaluation of the brain. No definite CT
evidence for acute intracranial abnormality.
2. Reversal of cervical lordosis with degenerative changes. No acute
osseous abnormality

## 2020-03-30 IMAGING — CT CT HEAD W/O CM
5 of 8 series · 17 of 47 positions shown, 18 images · non-contrast
Comparison: 02/23/2020

CLINICAL DATA: Fall, headache

EXAM:
CT HEAD WITHOUT CONTRAST
TECHNIQUE: Contiguous axial images were obtained from the base of the skull
through the vertex without intravenous contrast.

[Series 3: head wo · axial · 0.46mm/px · z∈[+1330,+1390]mm · 2 of 36 slices shown, 3 images (1 of 2)]
[im 12/36  brain]
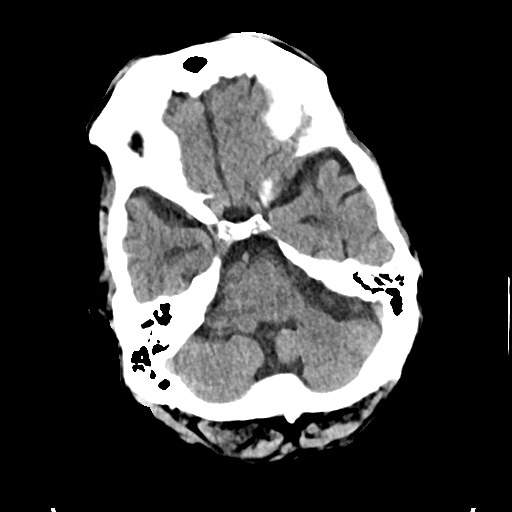
[im 12/36  bone]
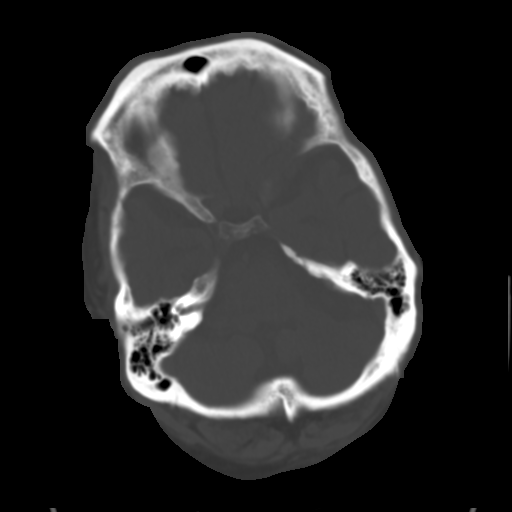
[im 24/36  brain]
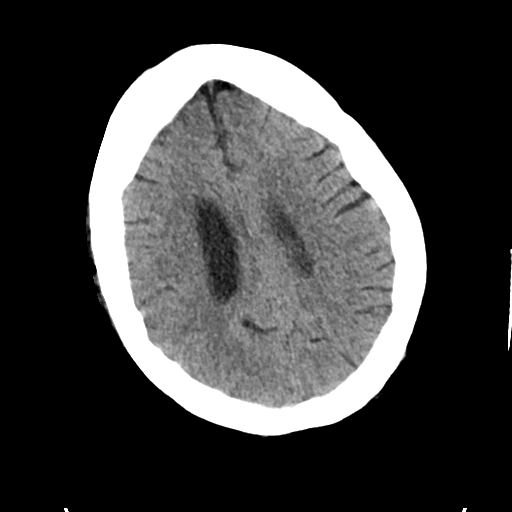

[Series 4: head bone · axial · 0.46mm/px · z∈[+1291,+1435]mm · 8 of 90 slices shown]
[im 9/90  bone]
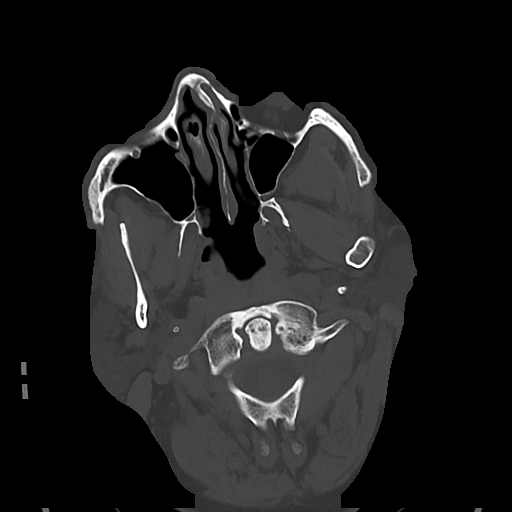
[im 18/90  bone]
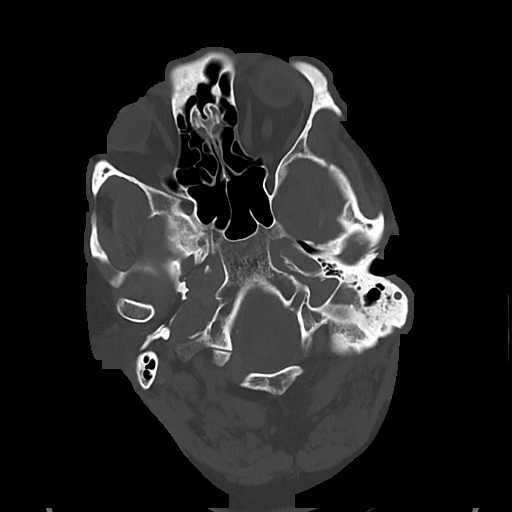
[im 27/90  bone]
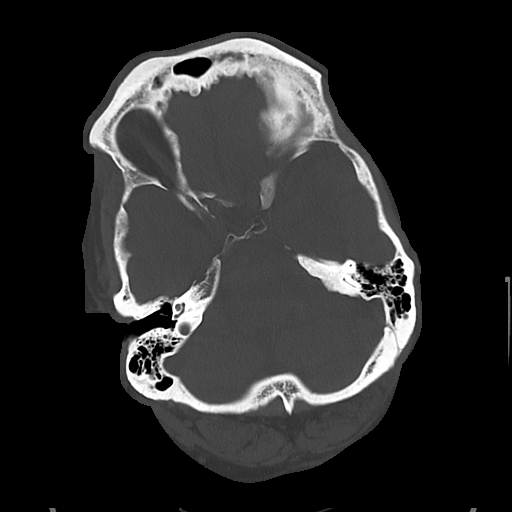
[im 36/90  bone]
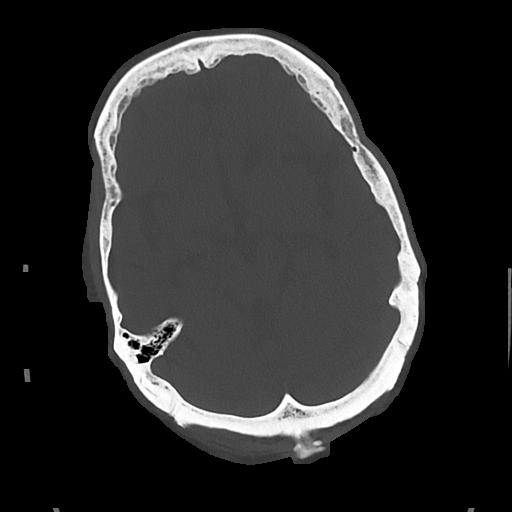
[im 54/90  bone]
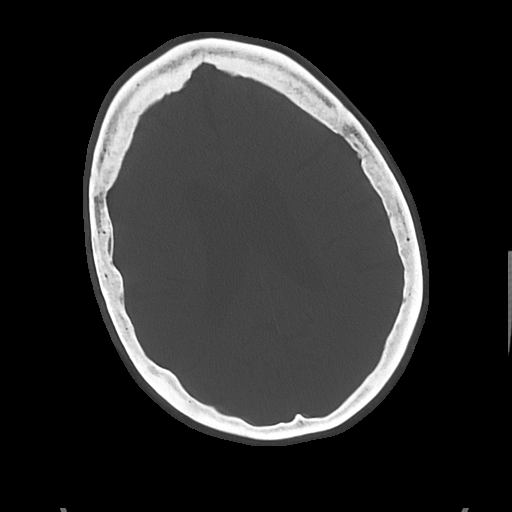
[im 63/90  bone]
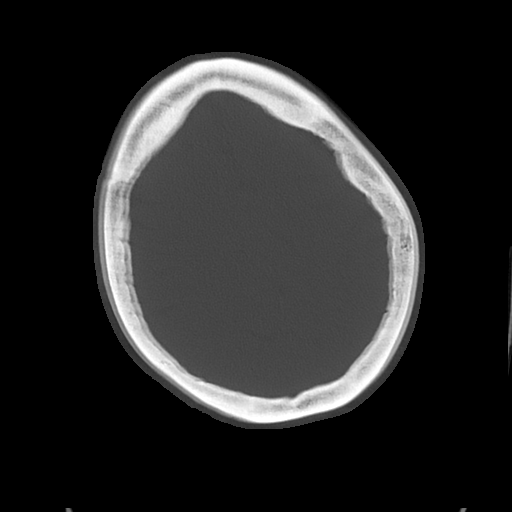
[im 72/90  bone]
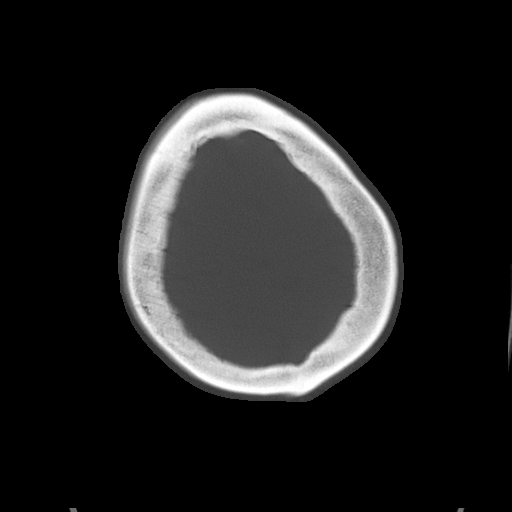
[im 81/90  bone]
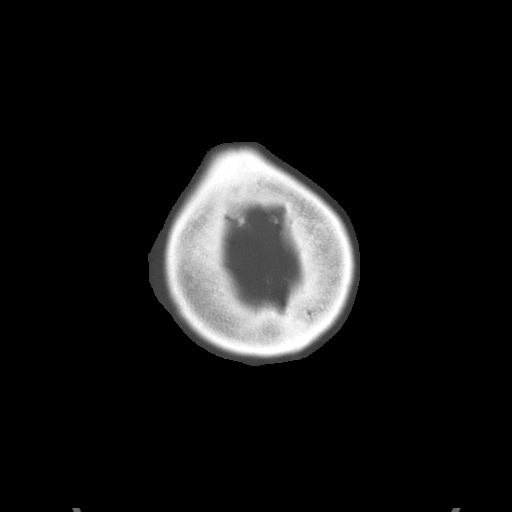

[Series 7: head wo · axial · 0.46mm/px · z∈[+1330,+1390]mm · 2 of 36 slices shown (2 of 2)]
[im 12/36  brain]
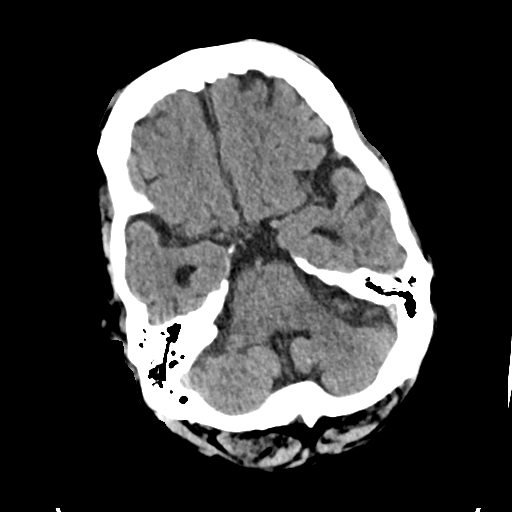
[im 24/36  brain]
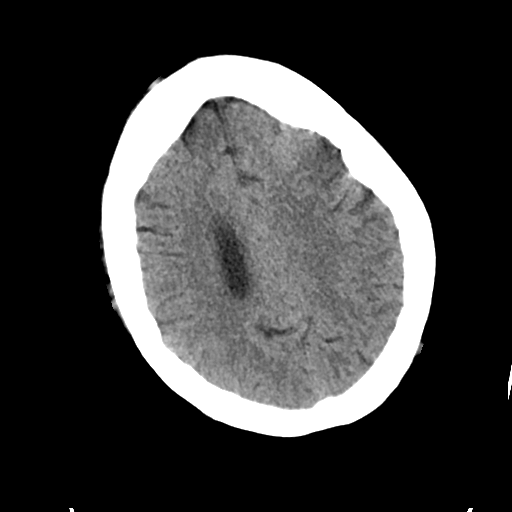

[Series 9: cor soft · coronal · 0.37mm/px · 3 of 73 slices shown]
[im 19/73  brain]
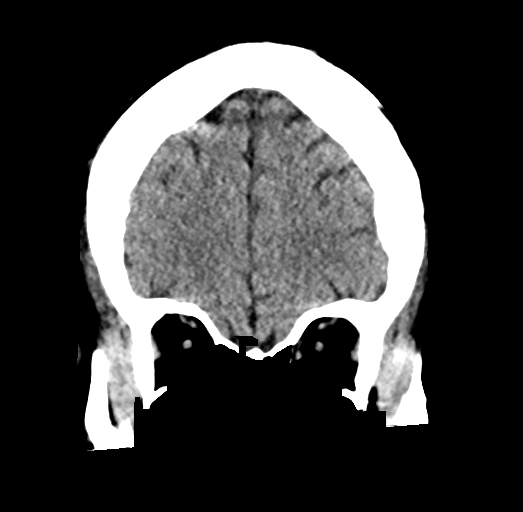
[im 37/73  brain]
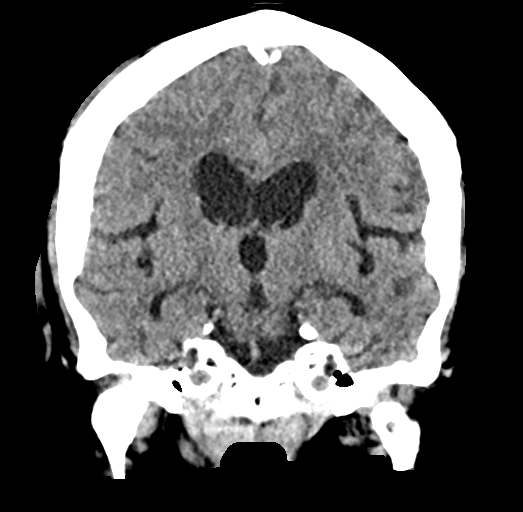
[im 55/73  brain]
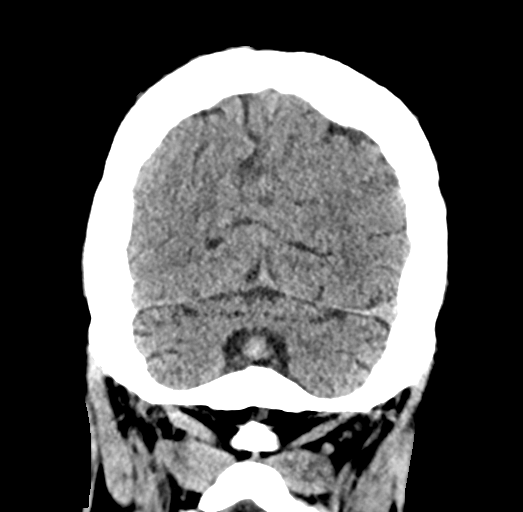

[Series 10: sag soft · sagittal · 0.37mm/px · 2 of 64 slices shown]
[im 22/64  brain]
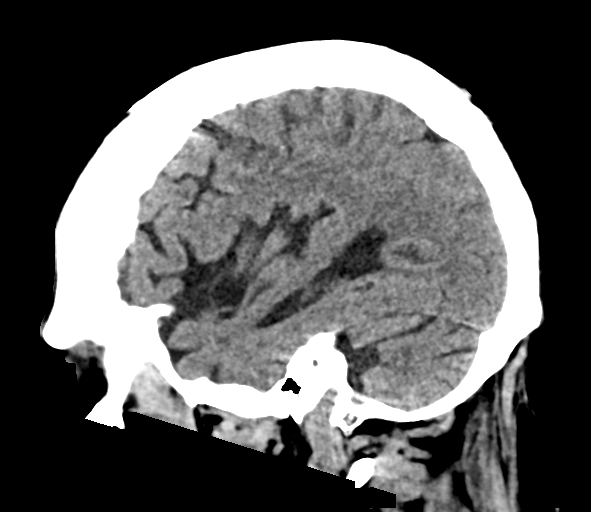
[im 43/64  brain]
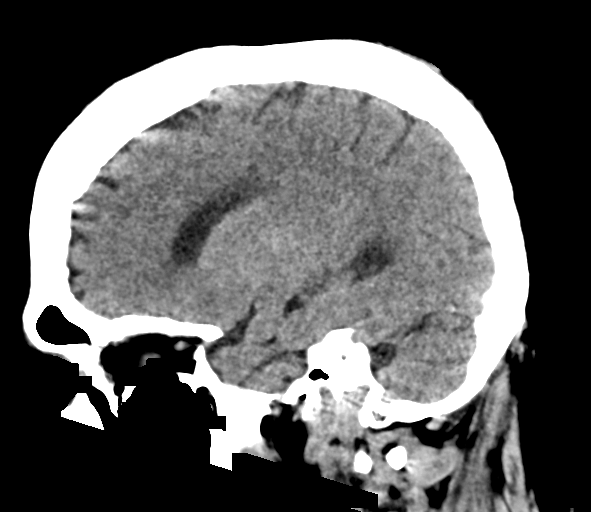

[17 of 47 positions shown; findings below may reference images not displayed]

FINDINGS: Brain: There is no acute intracranial hemorrhage, mass-effect, or
edema. Gray-white differentiation is preserved. There is no
extra-axial fluid collection. Ventricles and sulci are stable in
size and configuration.

Vascular: There is atherosclerotic calcification at the skull base.

Skull: Calvarium is intact. Chronic nasal bone deformity. Chronic
bowing of the left lamina papyracea, which may be posttraumatic.

Sinuses/Orbits: No acute finding.

Other: None.
IMPRESSION: No evidence of acute intracranial injury.

## 2020-03-30 IMAGING — CT CT CERVICAL SPINE W/O CM
3 of 4 series · 12 of 33 positions shown, 14 images · non-contrast
Comparison: 02/23/2020

CLINICAL DATA: Fall

EXAM:
CT CERVICAL SPINE WITHOUT CONTRAST
TECHNIQUE: Multidetector CT imaging of the cervical spine was performed without
intravenous contrast. Multiplanar CT image reconstructions were also
generated.

[Series 8: sag bone · sagittal · 0.25mm/px · 5 of 74 slices shown, 6 images]
[im 25/74  bone]
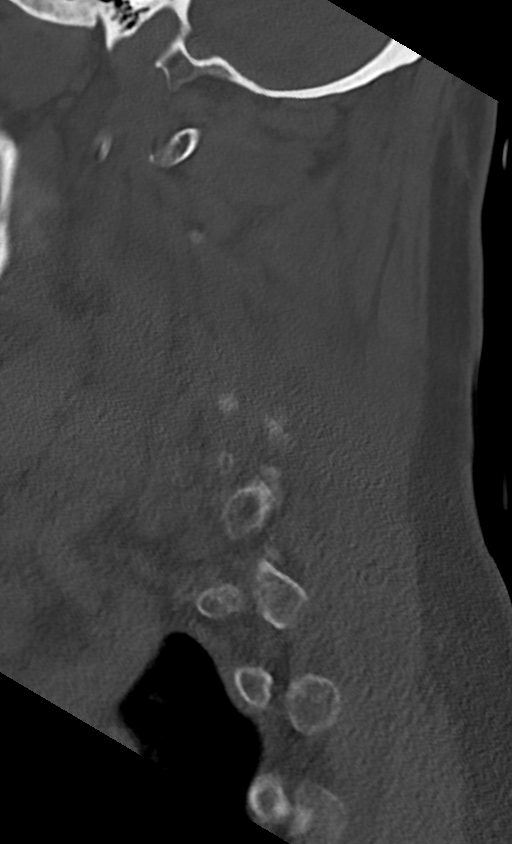
[im 31/74  bone]
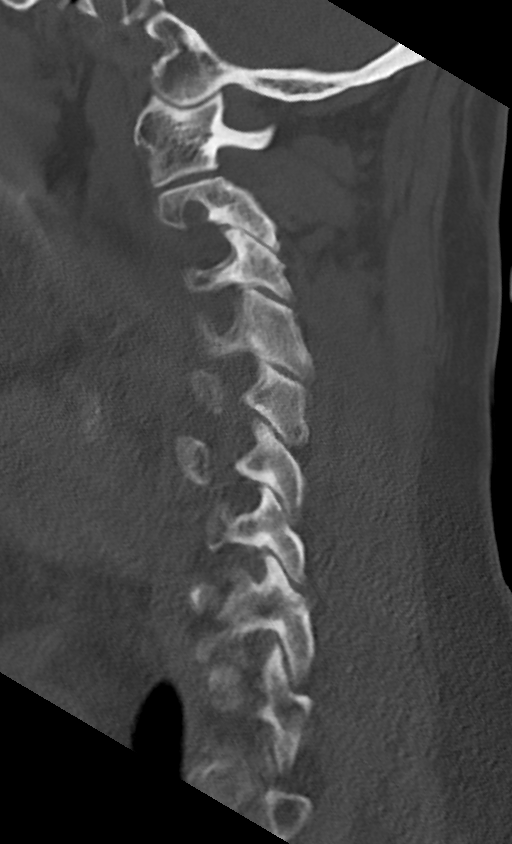
[im 37/74  soft-tissue]
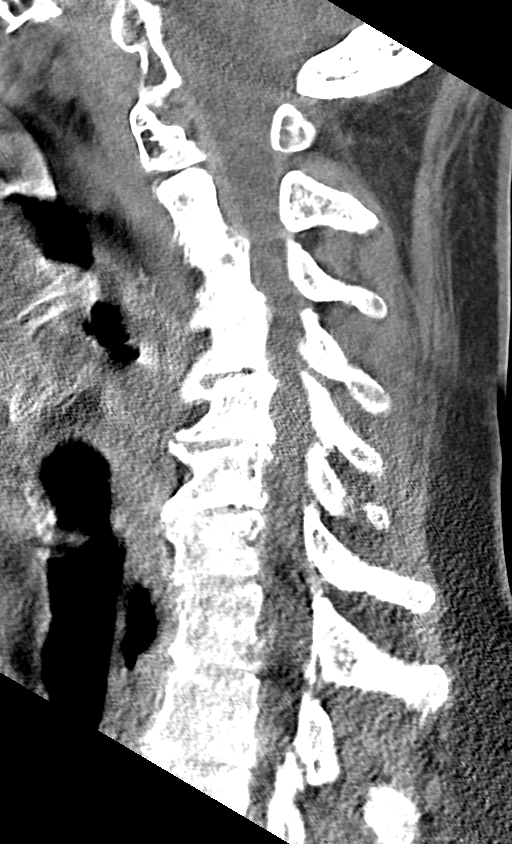
[im 37/74  bone]
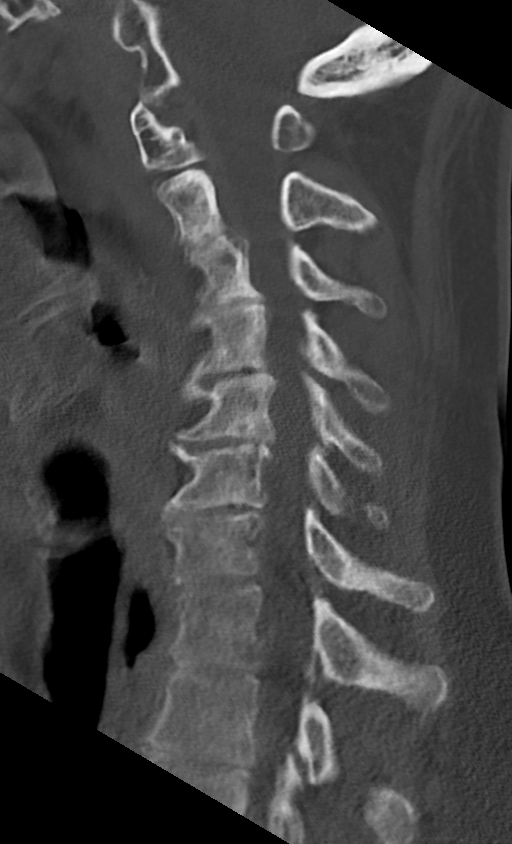
[im 43/74  bone]
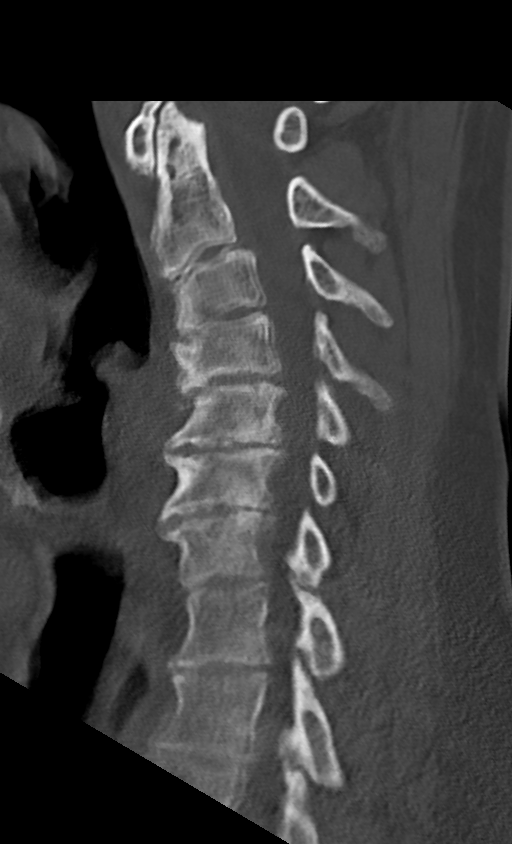
[im 49/74  bone]
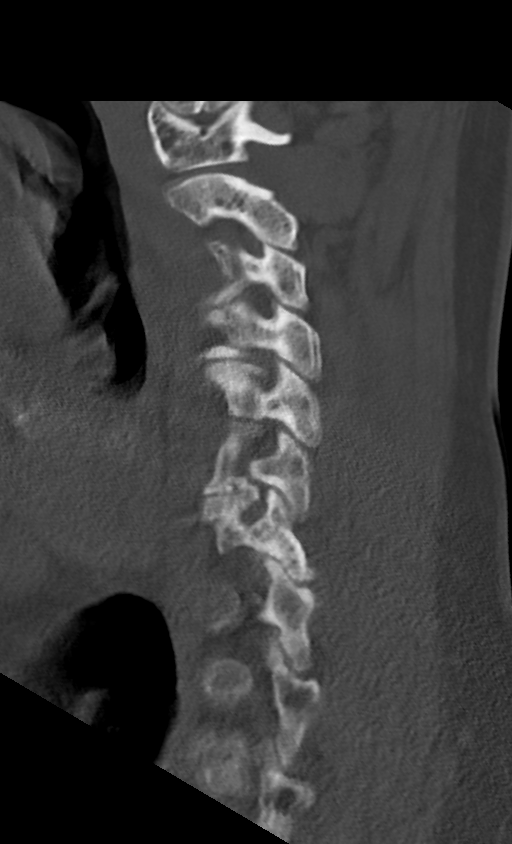

[Series 9: cor bone · coronal · 0.29mm/px · 3 of 60 slices shown]
[im 14/60  bone]
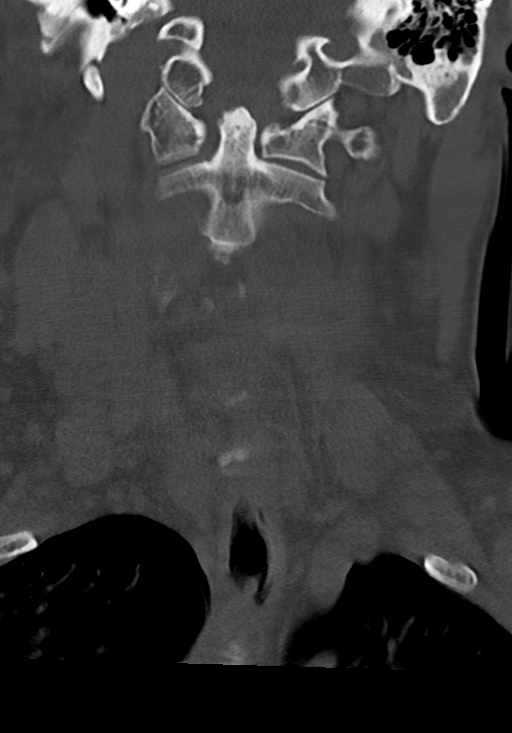
[im 25/60  bone]
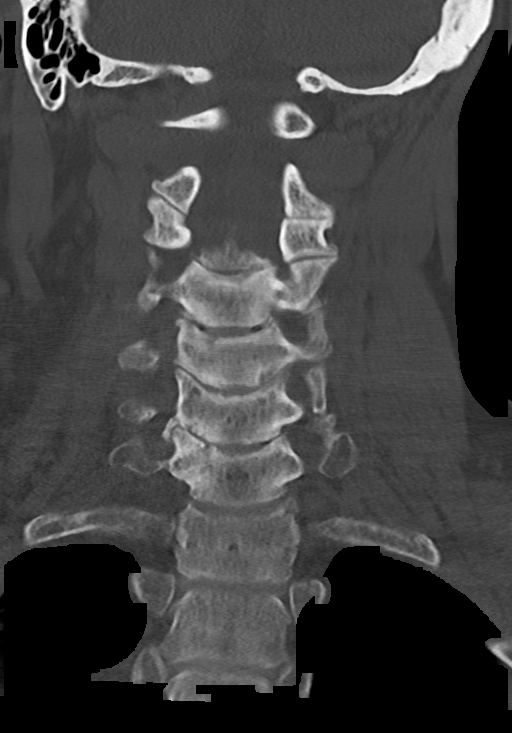
[im 36/60  bone]
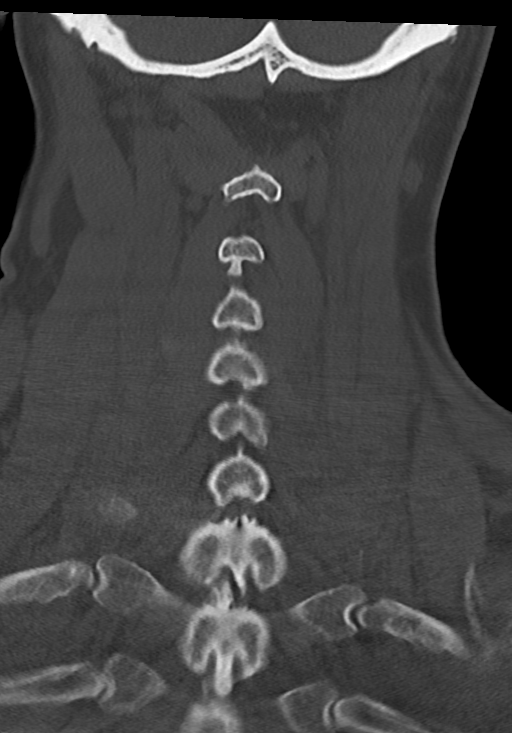

[Series 10: orthogonal axials · axial · 0.21mm/px · z∈[+1171,+1242]mm · 4 of 99 slices shown, 5 images]
[im 17/99  soft-tissue]
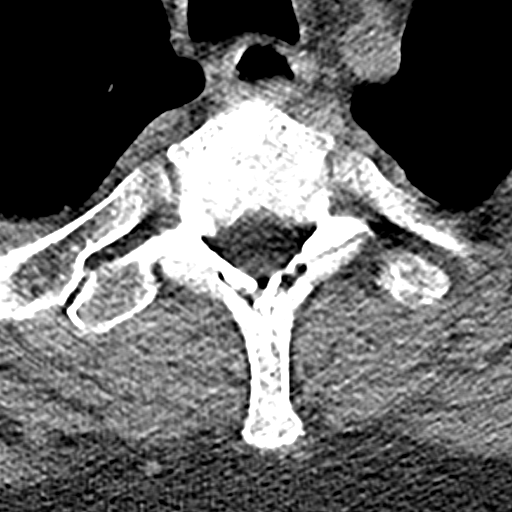
[im 17/99  bone]
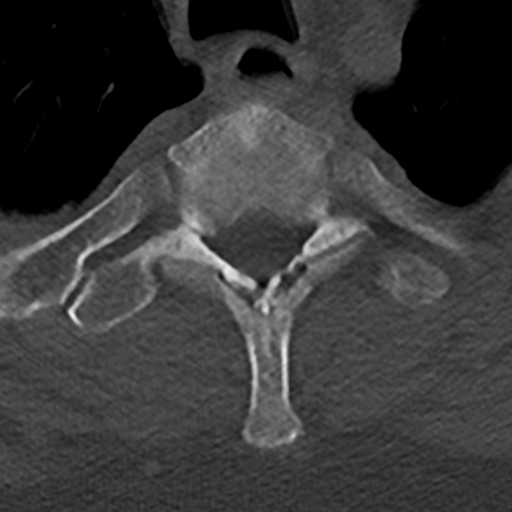
[im 33/99  bone]
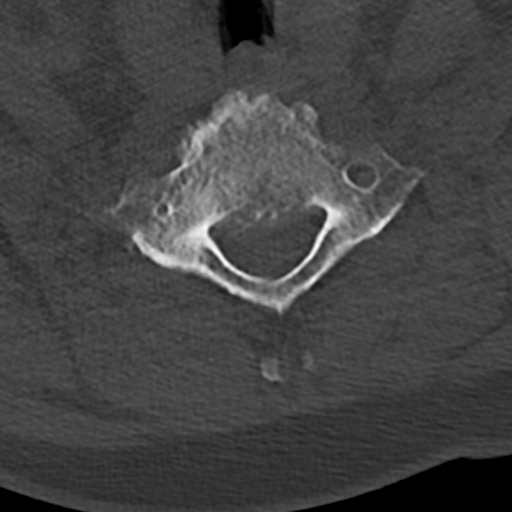
[im 66/99  bone]
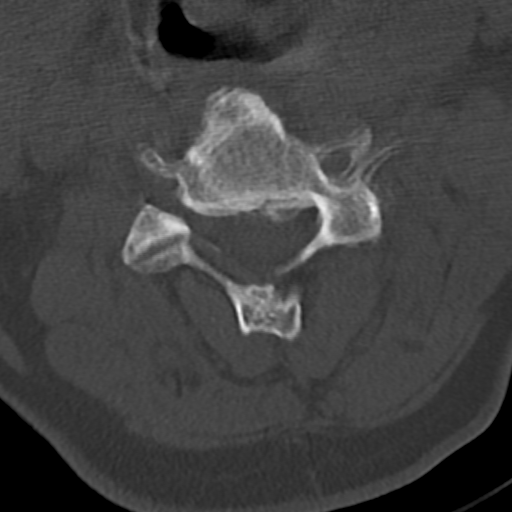
[im 82/99  bone]
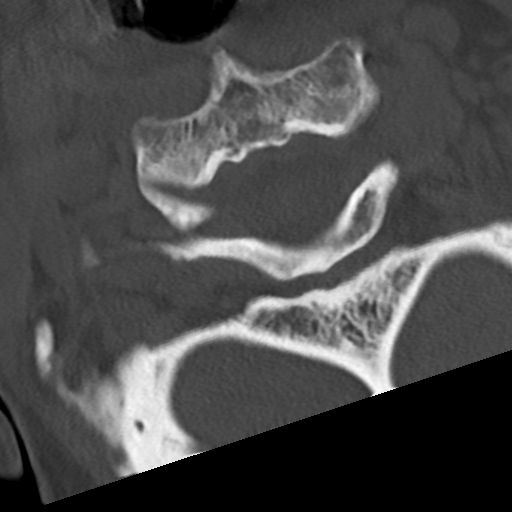

[12 of 33 positions shown; findings below may reference images not displayed]

FINDINGS: Motion degradation.

Alignment: Stable.

Skull base and vertebrae: No acute fracture within the above
limitation. Stable vertebral body heights.

Soft tissues and spinal canal: No prevertebral fluid or swelling. No
visible canal hematoma.

Disc levels: Multilevel degenerative changes are similar in
appearance over the short interval.

Upper chest: Small right pleural effusion.

Other: None
IMPRESSION: No acute cervical spine fracture within limitation of motion
artifact.

## 2020-03-30 IMAGING — CR DG SHOULDER 2+V*L*
2 series · 2 of 2 positions shown · non-contrast
Comparison: 12/05/2019

CLINICAL DATA: Fall with left upper chest pain and shoulder pain

EXAM:
LEFT SHOULDER - 2+ VIEW

[shoulder grashey]
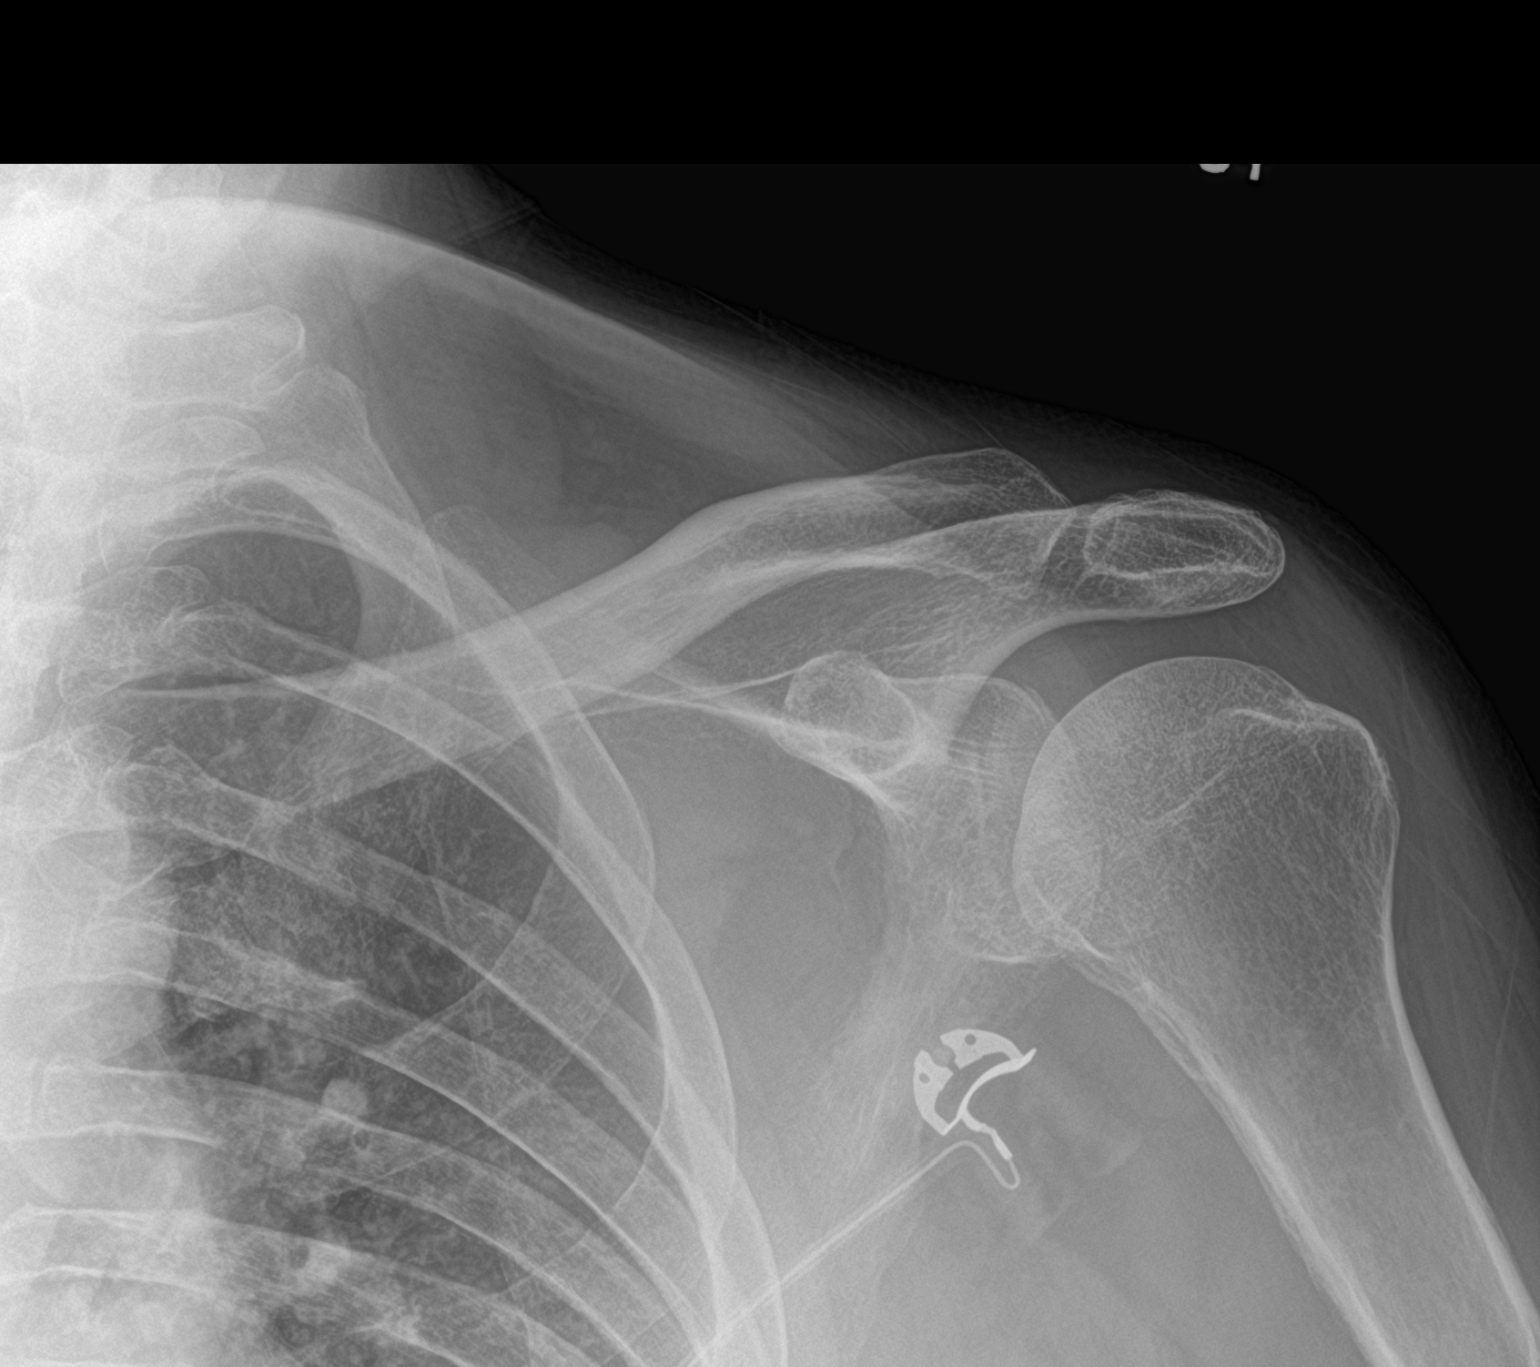

[shoulder y view]
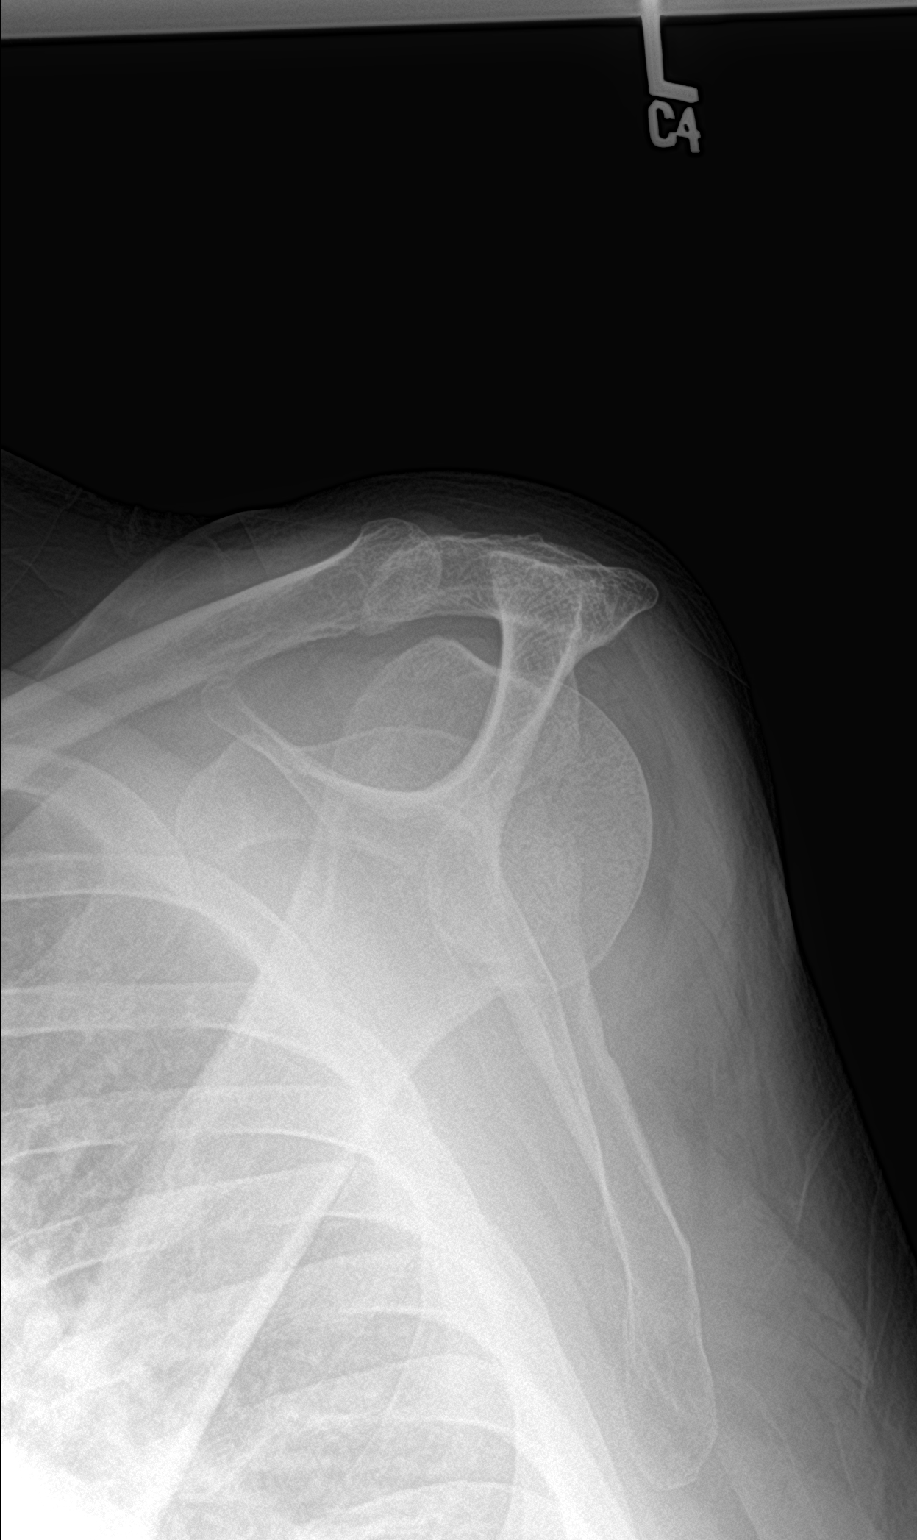

[2 of 2 positions shown; findings below may reference images not displayed]

FINDINGS: AC joint is intact.  No fracture or malalignment.
IMPRESSION: No acute osseous abnormality.

## 2020-03-30 IMAGING — CR DG CHEST 2V
2 series · 2 of 2 positions shown · non-contrast
Comparison: 07/04/2019

CLINICAL DATA: Fall with upper chest pain

EXAM:
CHEST - 2 VIEW

[chest lat]
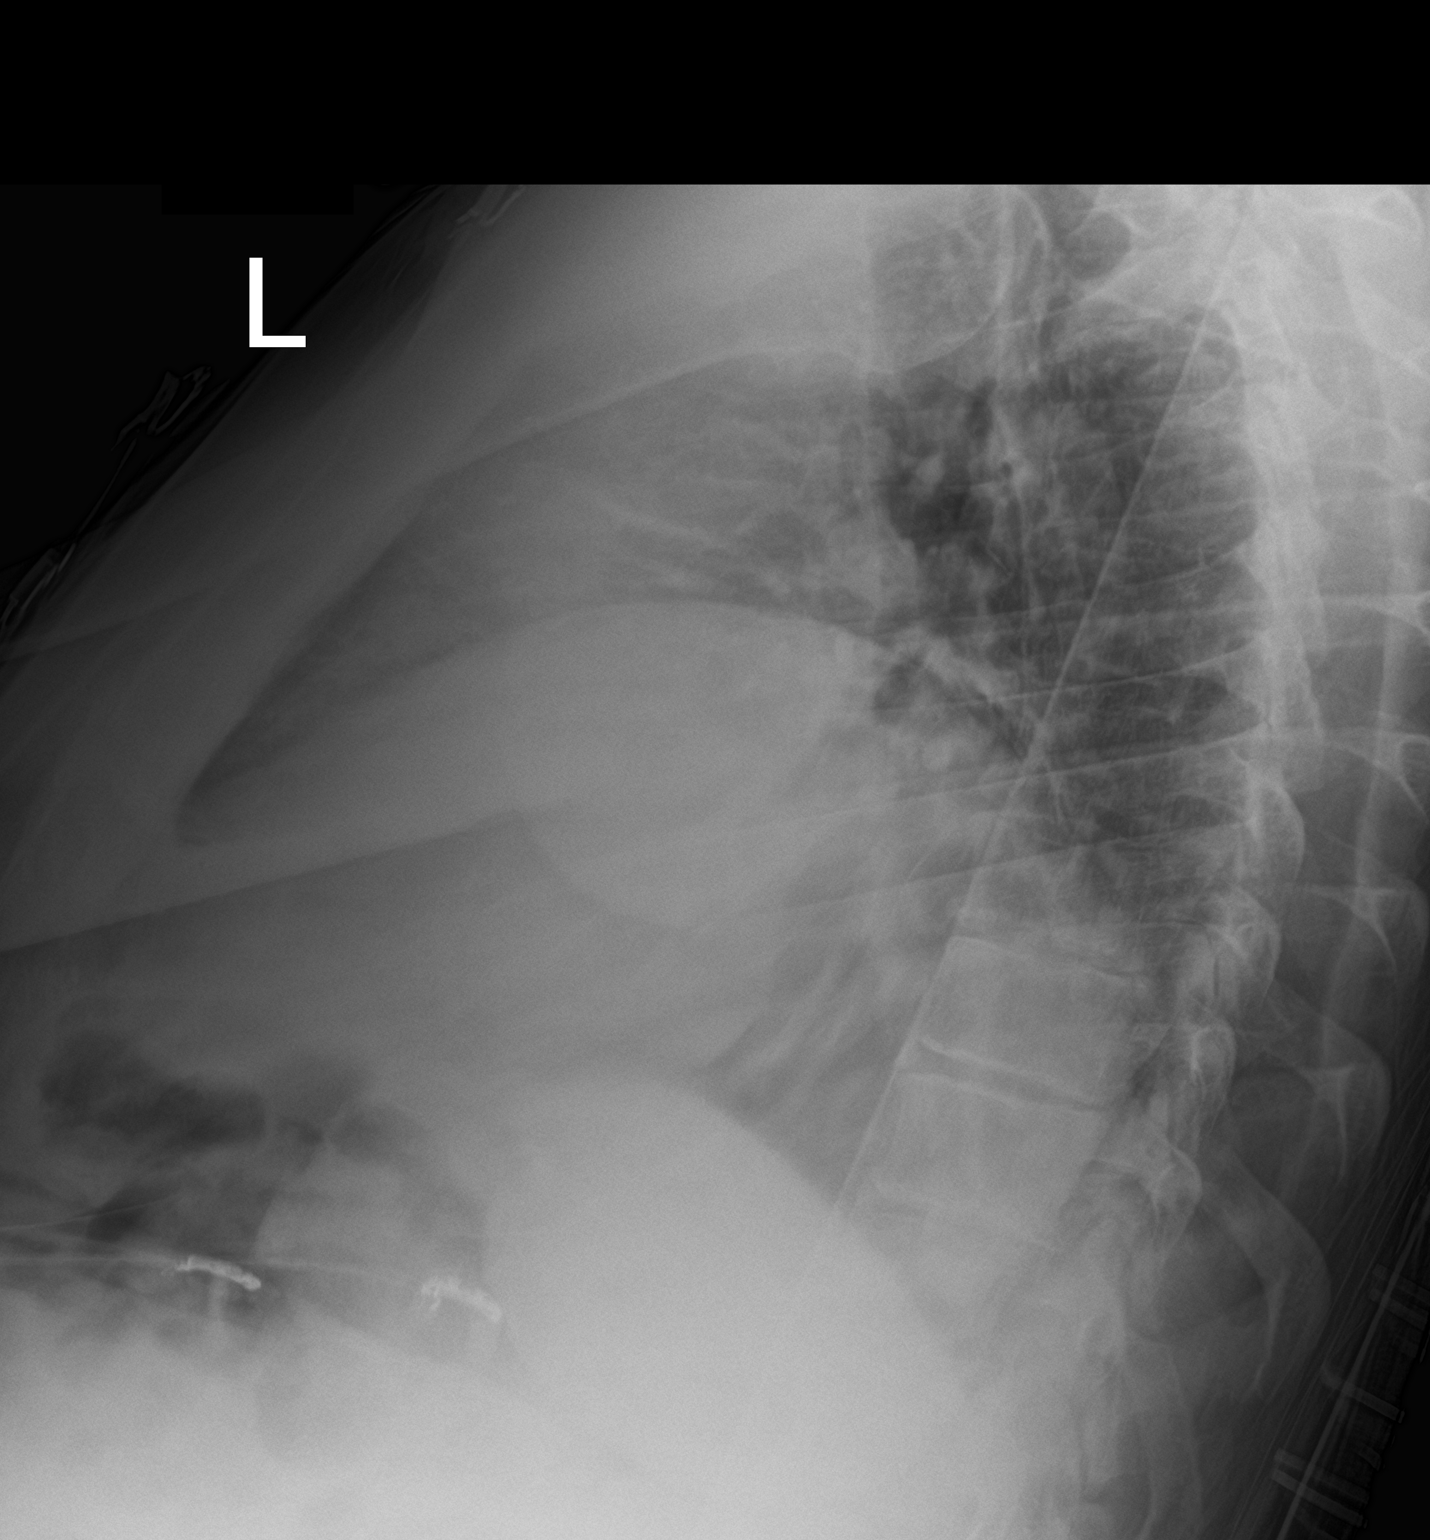

[chest ap]
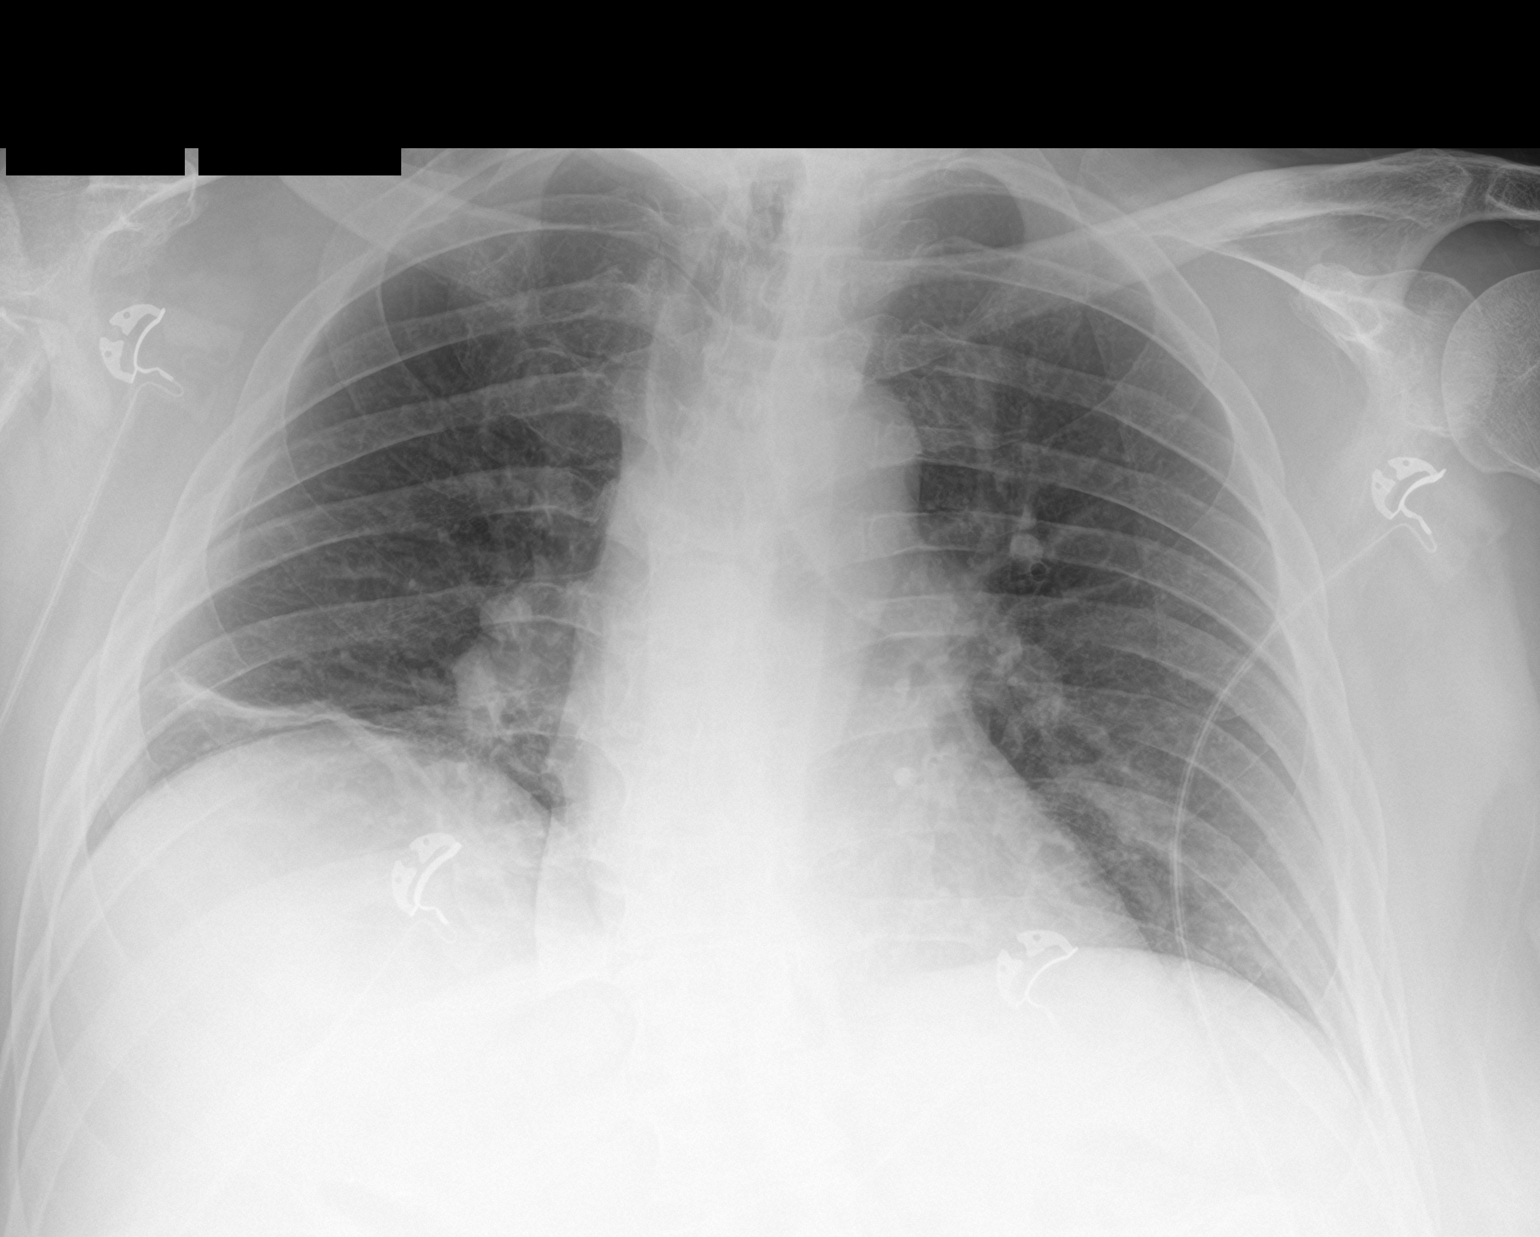

[2 of 2 positions shown; findings below may reference images not displayed]

FINDINGS: Chronic elevation of right diaphragm. Subsegmental atelectasis at
the right base. No consolidation, pleural effusion, or pneumothorax.
Stable cardiomediastinal silhouette.
IMPRESSION: No active cardiopulmonary disease. Subsegmental atelectasis at the
right base

## 2020-04-01 ENCOUNTER — Other Ambulatory Visit: Payer: Self-pay

## 2020-04-01 ENCOUNTER — Ambulatory Visit (INDEPENDENT_AMBULATORY_CARE_PROVIDER_SITE_OTHER): Payer: Medicare Other | Admitting: Otolaryngology

## 2020-04-01 VITALS — Temp 98.1°F

## 2020-04-01 DIAGNOSIS — S022XXA Fracture of nasal bones, initial encounter for closed fracture: Secondary | ICD-10-CM

## 2020-04-01 NOTE — Progress Notes (Signed)
HPI: [name] is a 54 y.o. male who presents for evaluation of nasal fracture.  Patient is in a long-term facility and is brought here by an Geophysicist/field seismologist.  Apparently he fell recently sustaining a nasal fracture.  He has a long history of multiple falls and injuries to his head and face.  On review of his older CT scans he had a longstanding nasal septal fracture.  Apparently on recent fall he had another CT scan that shows some evidence of new fracture of the nasal bone.  He has a severe deformity of the nose. On questioning the patient himself he does not know why he is in the office today.  Past Medical History:  Diagnosis Date  . Addison disease (HCC)   . Anoxic brain injury (HCC)   . Anxiety   . Aspiration pneumonia (HCC)   . Atrial fibrillation (HCC)   . Chronic constipation 11/23/2011  . Dementia (HCC)   . Dysphagia   . Encephalopathy   . Glaucoma   . Glucocorticoid deficiency (HCC)   . History of recurrent UTIs   . Hyperlipemia   . Hypothyroidism   . Mental disorder   . Myocardial infarction (HCC)   . Pneumoperitoneum of unknown etiology 12/21/2011  . Quadriplegia (HCC)   . Reflux   . Seizure disorder [facility])    Past Surgical History:  Procedure Laterality Date  . DEBRIDEMENT OF ABDOMINAL WALL ABSCESS  01/26/2018  . HERNIA MESH REMOVAL  01/26/2018   Infected mesh - removed w I&D abd wall abscess  . IR GENERIC HISTORICAL  10/20/2016   IR REPLC GASTRO/COLONIC TUBE PERCUT W/FLUORO 10/20/2016 Oley Balm, MD [facility]-INTERV RAD  . IR REPLACE G-TUBE SIMPLE WO FLUORO  01/28/2018  . IR REPLC GASTRO/COLONIC TUBE PERCUT W/FLUORO  04/21/2017  . IR REPLC GASTRO/COLONIC TUBE PERCUT W/FLUORO  07/22/2017  . IR REPLC GASTRO/COLONIC TUBE PERCUT W/FLUORO  11/24/2017  . IRRIGATION AND DEBRIDEMENT ABSCESS N/A 01/26/2018   Procedure: IRRIGATION AND DEBRIDEMENT ABDOMINAL WALL ABSCESS WITH REMOVAL OF INFECTED MESH;  Surgeon: Karie Soda, MD;  Location: [facility];  Service: General;  Laterality: N/A;   . NEPHRECTOMY  unknown  . PEG TUBE PLACEMENT     Social History   Socioeconomic History  . Marital status: Divorced    Spouse name: Not on file  . Number of children: Not on file  . Years of education: Not on file  . Highest education level: Not on file  Occupational History  . Not on file  Tobacco Use  . Smoking status: Never Smoker  . Smokeless tobacco: Never Used  Substance and Sexual Activity  . Alcohol use: No  . Drug use: No  . Sexual activity: Never  Other Topics Concern  . Not on file  Social History Narrative  . Not on file   Social Determinants of Health   Financial Resource Strain:   . Difficulty of Paying Living Expenses:   Food Insecurity:   . Worried About Programme researcher, broadcasting/film/video in the Last Year:   . Barista in the Last Year:   Transportation Needs:   . Freight forwarder (Medical):   Marland Kitchen Lack of Transportation (Non-Medical):   Physical Activity:   . Days of Exercise per Week:   . Minutes of Exercise per Session:   Stress:   . Feeling of Stress :   Social Connections:   . Frequency of Communication with Friends and Family:   . Frequency of Social Gatherings with Friends and Family:   .  Attends Religious Services:   . Active Member of Clubs or Organizations:   . Attends Archivist Meetings:   Marland Kitchen Marital Status:    No family history on file. Allergies  Allergen Reactions  . Ativan [Lorazepam] Other (See Comments)    Unknown (not listed on physician's orders from [facility] 07/05/15)  . Latex Dermatitis  . Morphine And Related Other (See Comments)    Listed on physician's orders from [facility] 07/05/15  . Penicillins Other (See Comments)    Patient tolerated cephalosporins in past.  Allergy listed on physician's orders from [facility] 07/05/15 Has patient had a PCN reaction causing immediate rash, facial/tongue/throat swelling, SOB or lightheadedness with hypotension: unknown Has patient had a PCN reaction causing severe rash  involving mucus membranes or skin necrosis: unknown Has patient had a PCN reaction that required hospitalization: no Has patient had a PCN reaction occurring within the last 10 years: yes If all of the above answe  . Pyridium [Phenazopyridine Hcl] Other (See Comments)    Listed on physician's orders from [facility] 07/05/15  . Soy Allergy Other (See Comments)    Listed on physician's orders from [facility] 07/05/15   Prior to Admission medications   Medication Sig Start Date End Date Taking? Authorizing Provider  acetaminophen (TYLENOL) 325 MG tablet Place 650 mg into feeding tube every 4 (four) hours as needed for mild pain.    [provider]  Amino Acids-Protein Hydrolys (FEEDING SUPPLEMENT, PRO-STAT SUGAR FREE 64,) LIQD Place 30 mLs into feeding tube 2 (two) times daily.     [provider]  aspirin 81 MG chewable tablet Chew 81 mg by mouth daily.    [provider]  Carboxymeth-Glycerin-Polysorb (REFRESH OPTIVE ADVANCED OP) Place 1 drop into both eyes 3 (three) times daily.    [provider]  divalproex (DEPAKOTE SPRINKLE) 125 MG capsule Take 500 mg by mouth 2 (two) times daily.  07/18/13   [provider]  levothyroxine (SYNTHROID, LEVOTHROID) 150 MCG tablet Place 150 mcg into feeding tube daily before breakfast.     [provider]  metoprolol tartrate (LOPRESSOR) 25 MG tablet Take 12.5 mg by mouth 2 (two) times daily.    [provider]  Multiple Vitamins-Minerals (CERTAVITE/ANTIOXIDANTS) LIQD 15 mLs by PEG Tube route daily at 12 noon.     [provider]  pantoprazole sodium (PROTONIX) 40 mg/20 mL PACK 40 mg by PEG Tube route daily at 12 noon.  11/30/11   Ernest Haber, MD  PARoxetine (PAXIL) 30 MG tablet Take 15 mg by mouth daily.    [provider]  polyethylene glycol (MIRALAX / GLYCOLAX) packet 17 g by PEG Tube route 2 (two) times daily. Mix 17gm  In 4-8 ounces of liquid    [provider]   polyvinyl alcohol (LIQUIFILM TEARS) 1.4 % ophthalmic solution Place 1 drop into both eyes 2 (two) times daily.    [provider]  risperiDONE (RISPERDAL M-TABS) 1 MG disintegrating tablet Place 1 mg under the tongue 3 (three) times daily.  05/08/17   [provider]  sennosides (SENOKOT) 8.8 MG/5ML syrup Place 15 mLs into feeding tube 2 (two) times daily.     [provider]  traZODone (DESYREL) 150 MG tablet Take 75 mg by mouth at bedtime.  08/02/13   [provider]     Positive ROS: Otherwise negative  All other systems have been reviewed and were otherwise negative with the exception of those mentioned  in the HPI and as above.  Physical Exam: Constitutional: Alert, well-appearing, no acute distress Ears: External ears without lesions or tenderness. Ear canals are clear bilaterally with intact, clear TMs.  Nasal: Patient with a severe deformity of the external nose with the bones severely deviated to the left side down.  On palpation these are rigid and fixed and not tender to palpation.  There is no acute swelling.  Intranasal exam reveals a severely deviated septum but both nasal passages are clear bilaterally with no evidence of septal hematoma. Oral: Lips and gums without lesions. Tongue and palate mucosa without lesions. Posterior oropharynx clear. Neck: No palpable adenopathy or masses Respiratory: Breathing comfortably  Skin: No facial/neck lesions or rash noted.  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  Functionally he is breathing well through both sides.  Plan: No intervention is warranted at this time as functionally patient is breathing well on both sides.  Cosmetic repair of nasal fracture is not warranted. No specific therapy is necessary except for trying to prevent excessive falls and trauma to his face.  Narda Bonds, MD

## 2020-04-02 IMAGING — CT CT CERVICAL SPINE W/O CM
4 series · 14 of 33 positions shown, 17 images · non-contrast
Comparison: Head CT dated 02/26/2020.

CLINICAL DATA: 53-year-old male with trauma.

EXAM:
CT HEAD WITHOUT CONTRAST
CT CERVICAL SPINE WITHOUT CONTRAST
TECHNIQUE: Multidetector CT imaging of the head and cervical spine was
performed following the standard protocol without intravenous
contrast. Multiplanar CT image reconstructions of the cervical spine
were also generated.

[Series 5: c spine soft · axial · 0.40mm/px · 1 of 114 slices shown]
[im 19/114  soft-tissue]
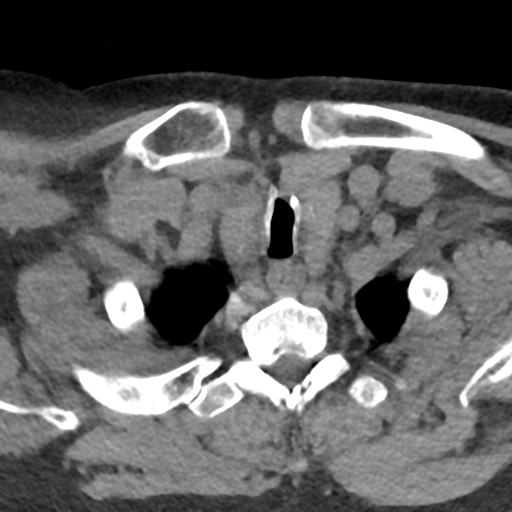

[Series 8: sag bone · sagittal · 0.45mm/px · 5 of 82 slices shown, 6 images]
[im 28/82  bone]
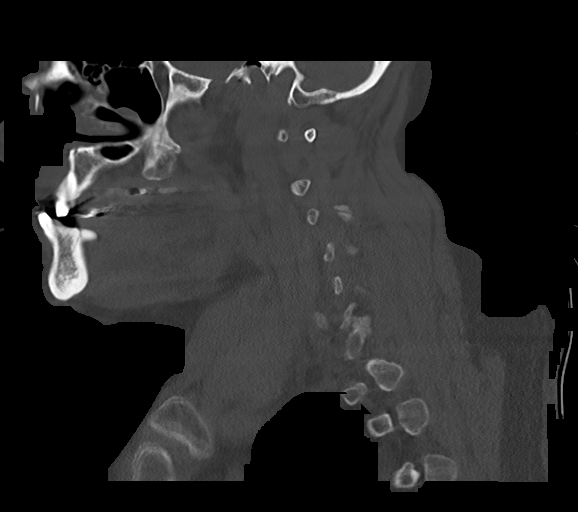
[im 34/82  bone]
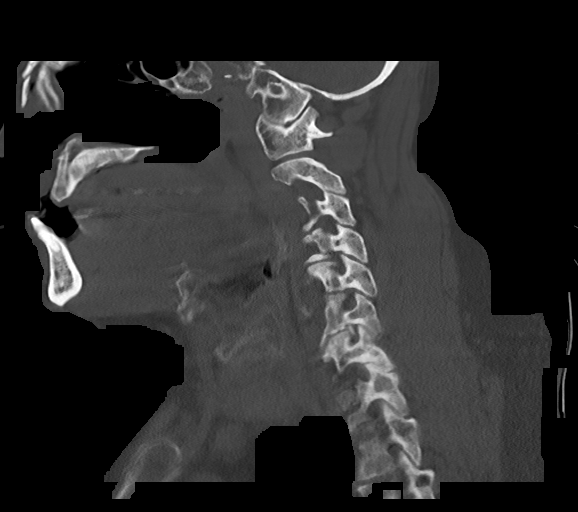
[im 41/82  soft-tissue]
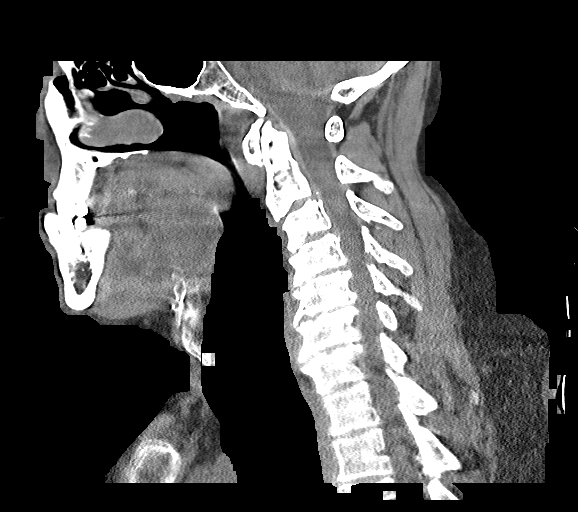
[im 41/82  bone]
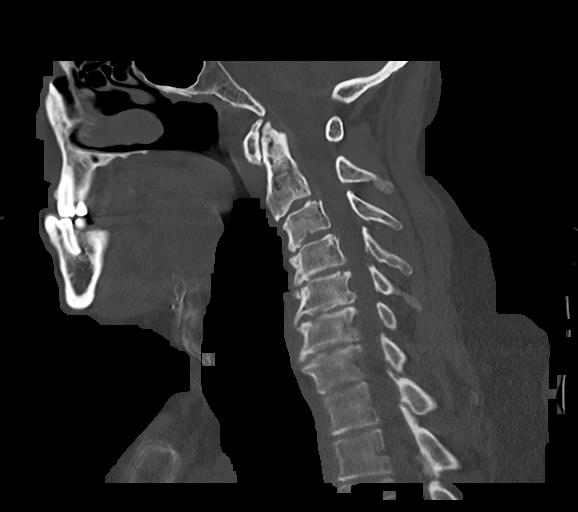
[im 48/82  bone]
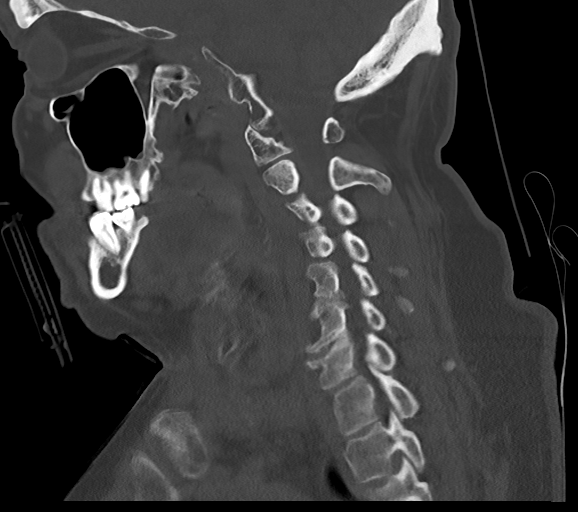
[im 55/82  bone]
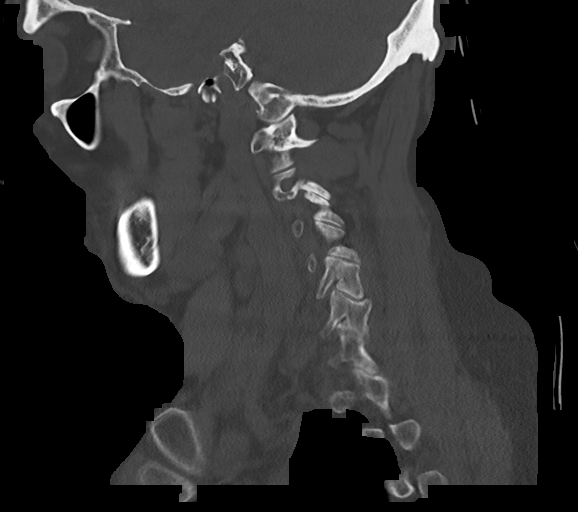

[Series 9: cor bone · coronal · 0.45mm/px · 3 of 131 slices shown]
[im 27/131  bone]
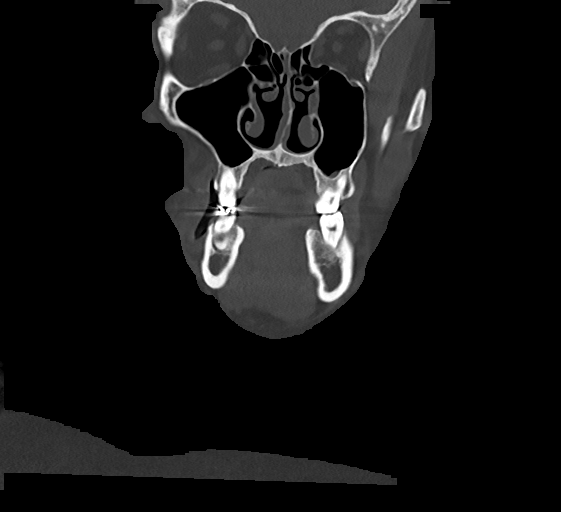
[im 53/131  bone]
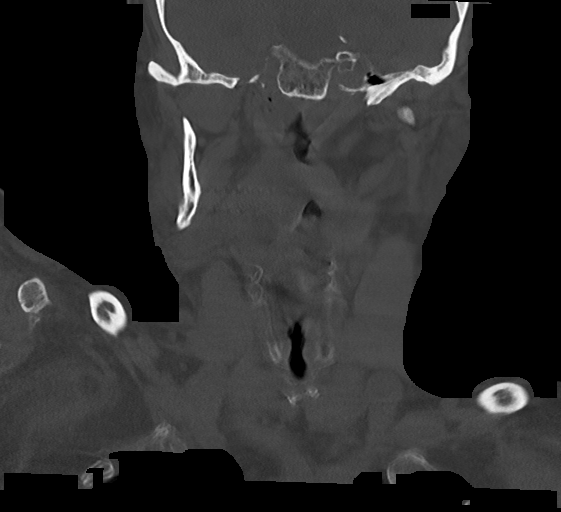
[im 79/131  bone]
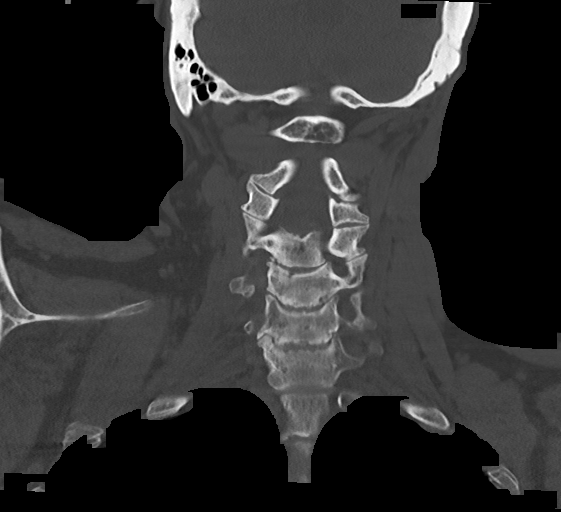

[Series 10: orthogonal axials · axial · 0.21mm/px · z∈[+1158,+1269]mm · 5 of 99 slices shown, 7 images]
[im 17/99  soft-tissue]
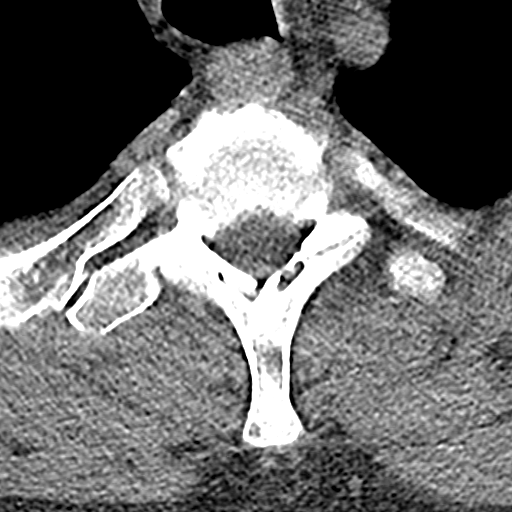
[im 17/99  bone]
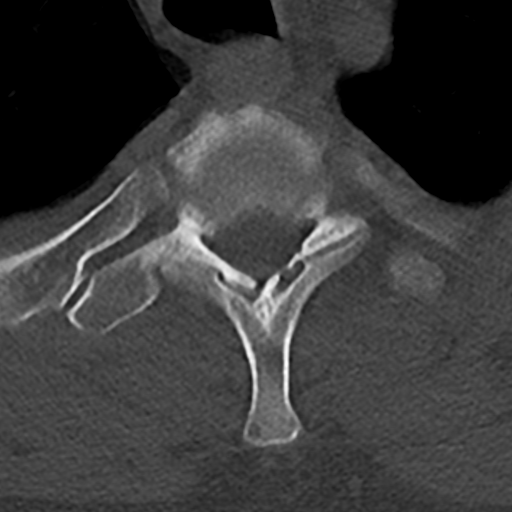
[im 33/99  bone]
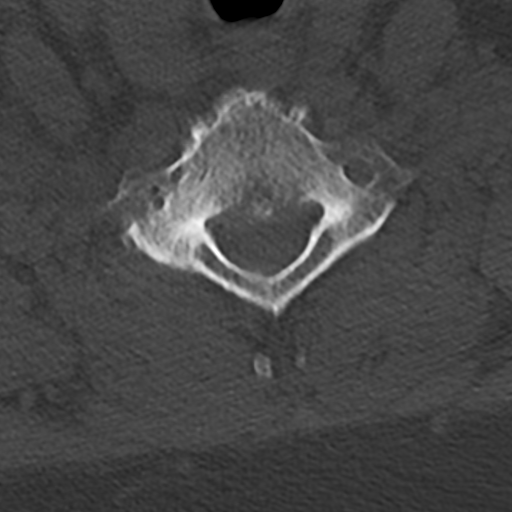
[im 50/99  bone]
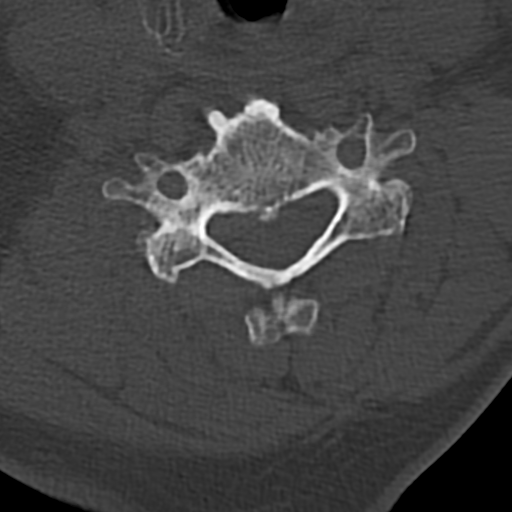
[im 66/99  bone]
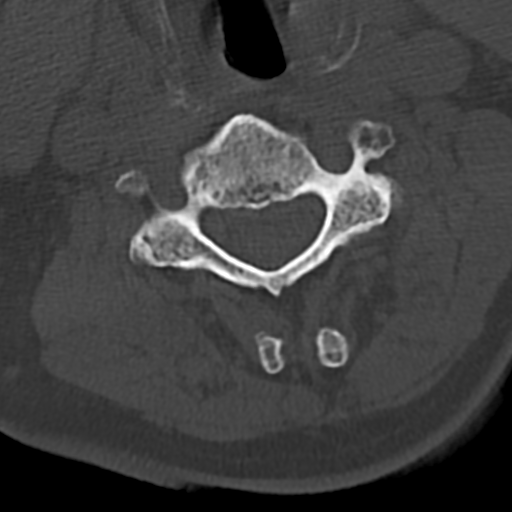
[im 82/99  soft-tissue]
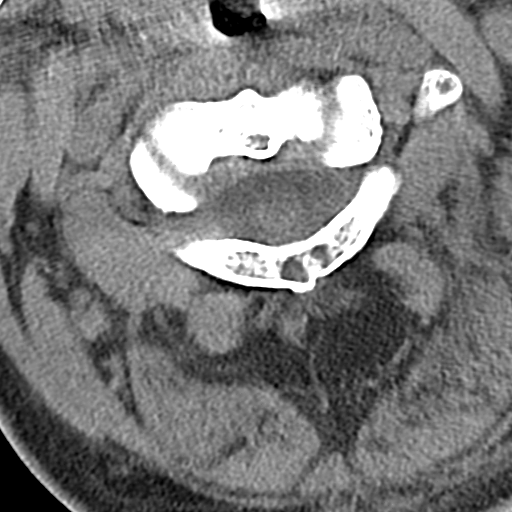
[im 82/99  bone]
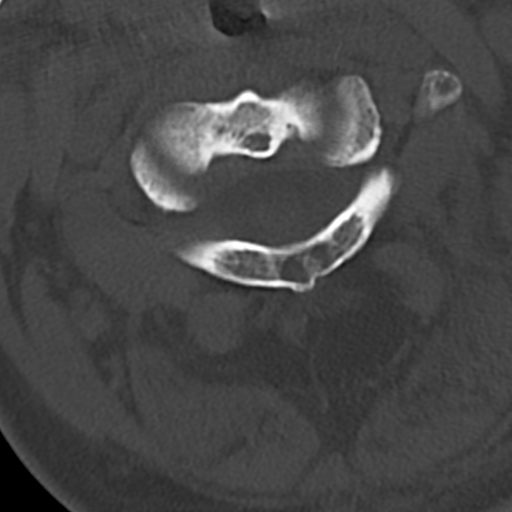

[14 of 33 positions shown; findings below may reference images not displayed]

FINDINGS: CT HEAD FINDINGS

Brain: The ventricles and sulci are appropriate size for patient's
age. The gray-white matter discrimination is preserved. There is no
acute intracranial hemorrhage. No mass effect or midline shift. No
extra-axial fluid collection.

Vascular: No hyperdense vessel or unexpected calcification.

Skull: Normal. Negative for fracture or focal lesion.

Sinuses/Orbits: Mild mucoperiosteal thickening of paranasal sinuses.
No air-fluid level. Minimal left mastoid effusions. There is
depressed nasal bone fracture as seen previously. There is fracture
of the anterior nasal septum with bowing of the septum to the left
and deviation of the nose to the right. There is perforation of the
inferior nasal septum which can be seen in the setting of substance
use.

Other: Mild contusion over the forehead.

CT CERVICAL SPINE FINDINGS

Alignment: No acute subluxation. There is reversal of normal
cervical lordosis which may be positional or due to muscle spasm or
secondary to degenerative changes.

Skull base and vertebrae: No acute fracture.

Soft tissues and spinal canal: No prevertebral fluid or swelling. No
visible canal hematoma.

Disc levels: Multilevel degenerative changes with bone spurring and
osteophyte.

Upper chest: Partially visualized right pleural effusion. Diffuse
interstitial hazy density may represent edema.

Other: None
IMPRESSION: 1. No acute intracranial pathology.
2. No acute/traumatic cervical spine pathology.
3. Depressed nasal bone fracture and fracture of the anterior nasal
septum.
4. Partially visualized right pleural effusion and findings
suggestive of pulmonary edema.

## 2020-04-02 IMAGING — CR DG CHEST 1V
1 series · 1 of 1 positions shown · non-contrast
Comparison: Chest radiograph dated 02/26/2020

CLINICAL DATA: 53-year-old male with fall.

EXAM:
CHEST  1 VIEW

[chest ap]
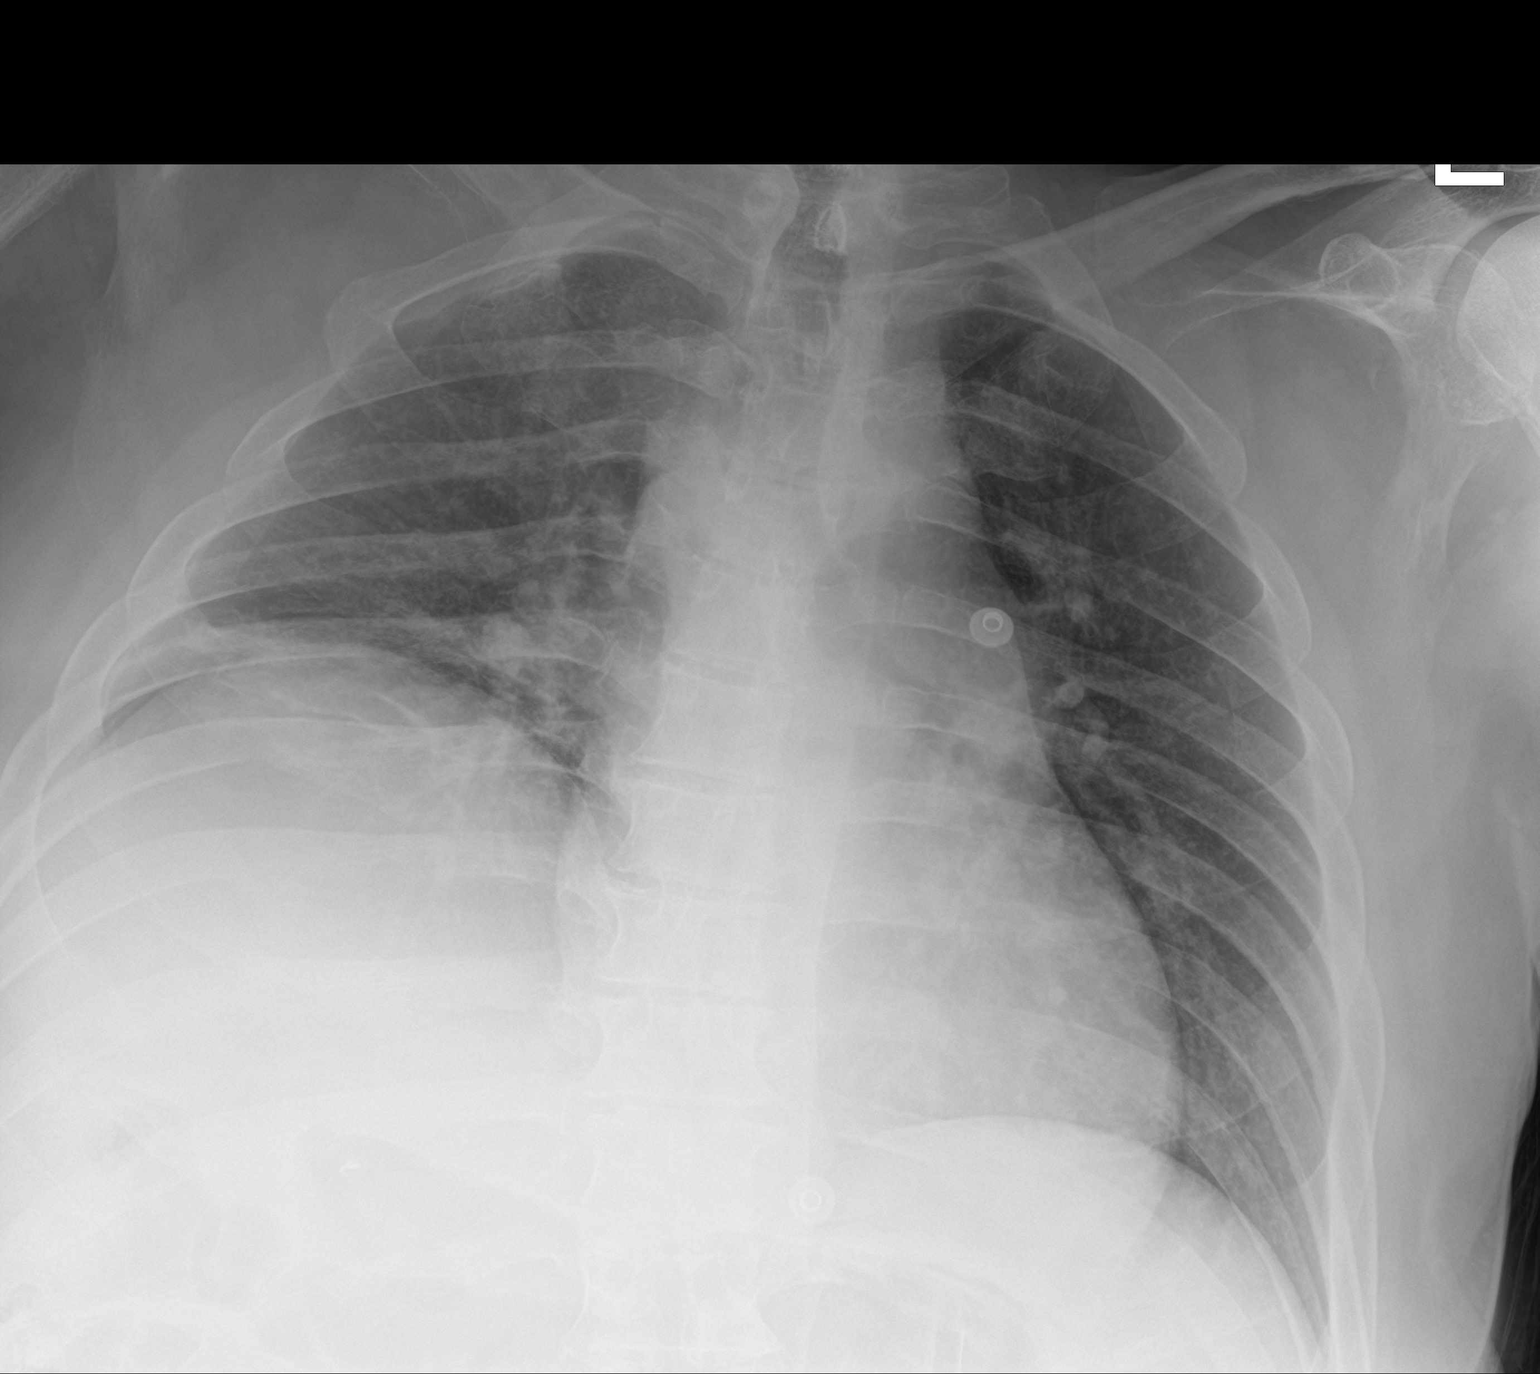

[1 of 1 positions shown; findings below may reference images not displayed]

FINDINGS: There is image a shin of the right hemidiaphragm with right lung
base atelectasis. The left lung is clear. No pleural effusion or
pneumothorax. There is mild cardiomegaly. No acute osseous
pathology.
IMPRESSION: 1. No acute cardiopulmonary process.
2. Mild cardiomegaly.

## 2020-04-02 IMAGING — CT CT HEAD W/O CM
4 series · 16 of 47 positions shown, 18 images · non-contrast
Comparison: Head CT dated 02/26/2020.

CLINICAL DATA: 53-year-old male with trauma.

EXAM:
CT HEAD WITHOUT CONTRAST
CT CERVICAL SPINE WITHOUT CONTRAST
TECHNIQUE: Multidetector CT imaging of the head and cervical spine was
performed following the standard protocol without intravenous
contrast. Multiplanar CT image reconstructions of the cervical spine
were also generated.

[Series 3: head wo · axial · 0.46mm/px · z∈[+1313,+1443]mm · 7 of 36 slices shown, 9 images]
[im 5/36  brain]
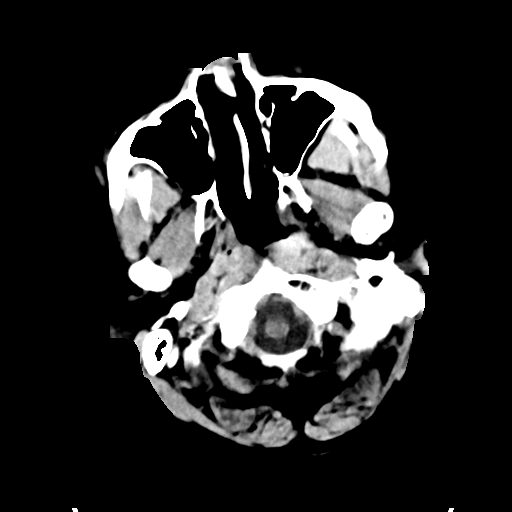
[im 5/36  bone]
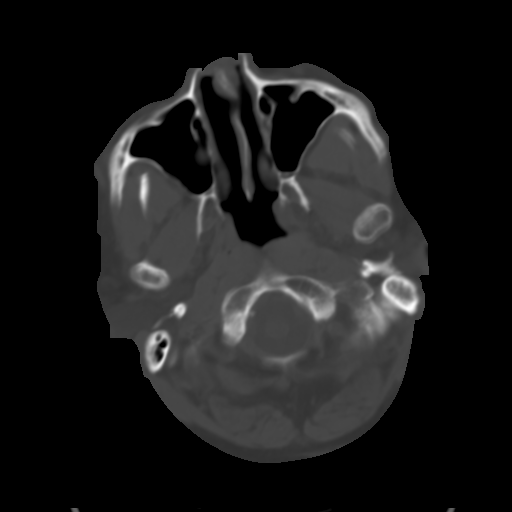
[im 9/36  brain]
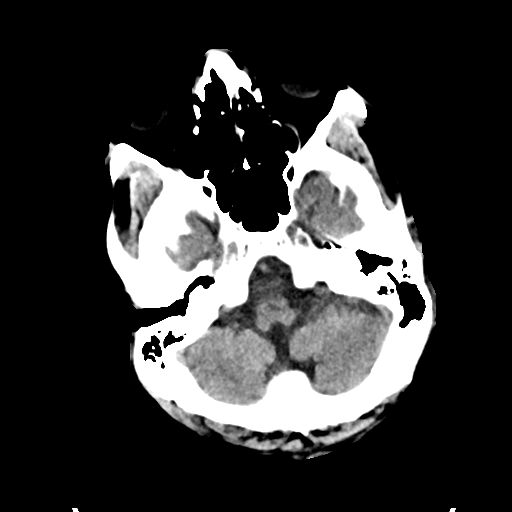
[im 14/36  brain]
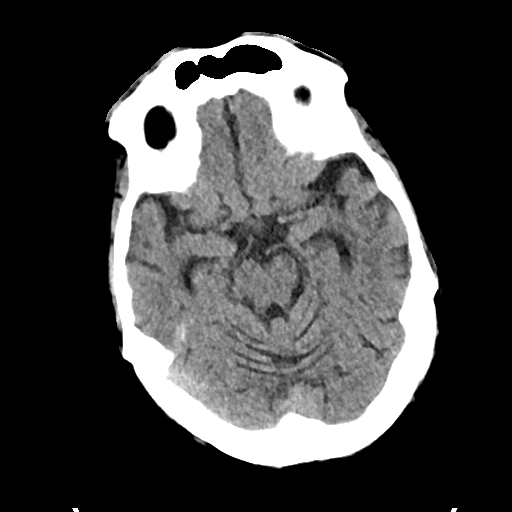
[im 18/36  brain]
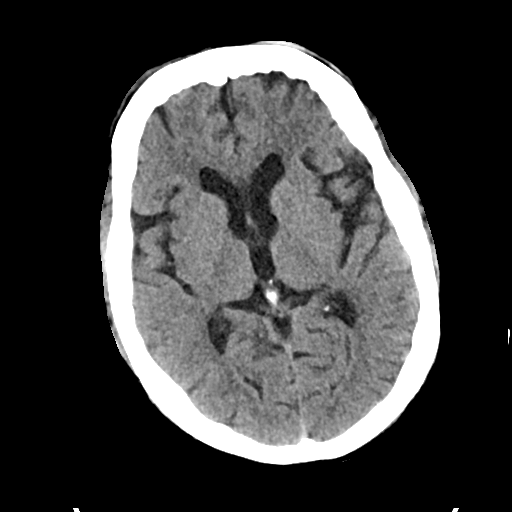
[im 22/36  brain]
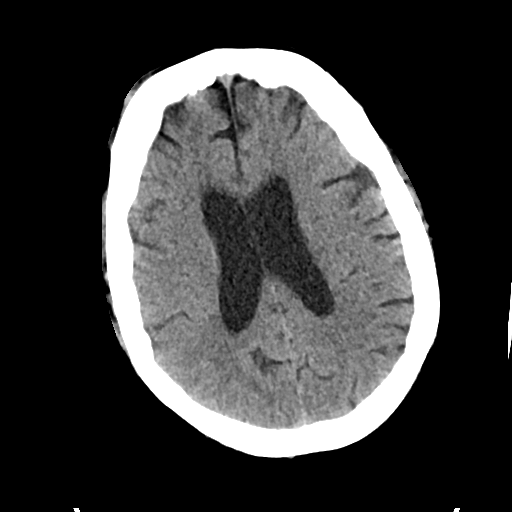
[im 22/36  bone]
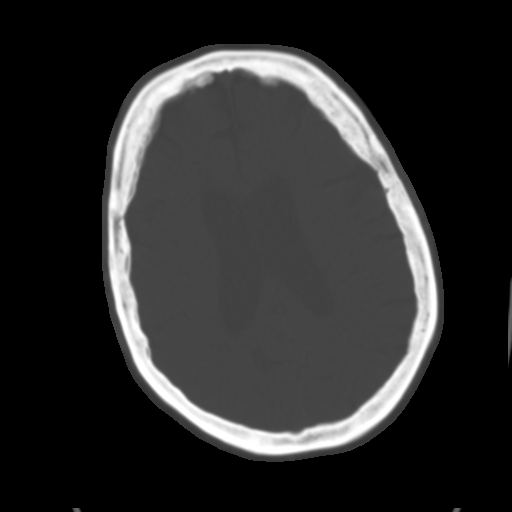
[im 27/36  brain]
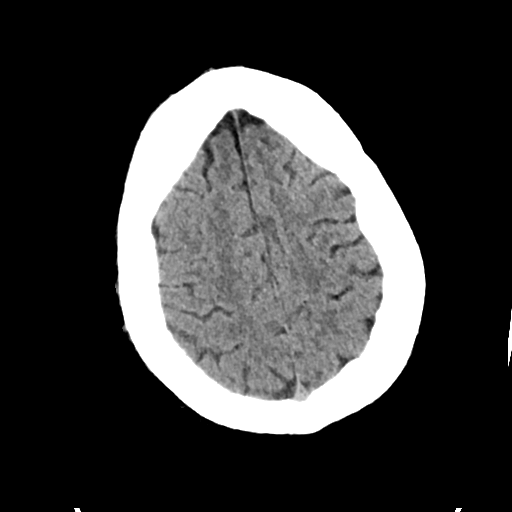
[im 31/36  brain]
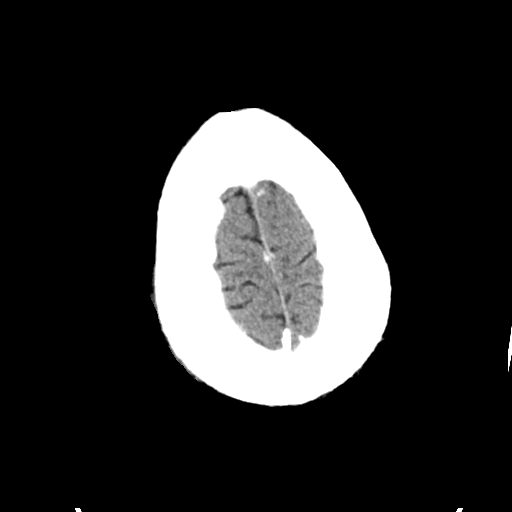

[Series 4: head bone · axial · 0.46mm/px · z∈[+1309,+1345]mm · 3 of 90 slices shown]
[im 9/90  bone]
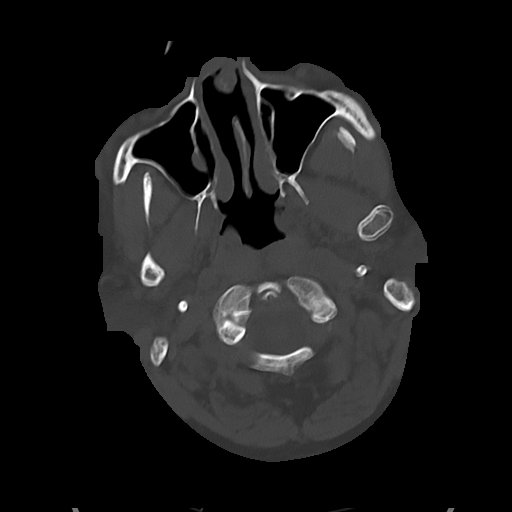
[im 18/90  bone]
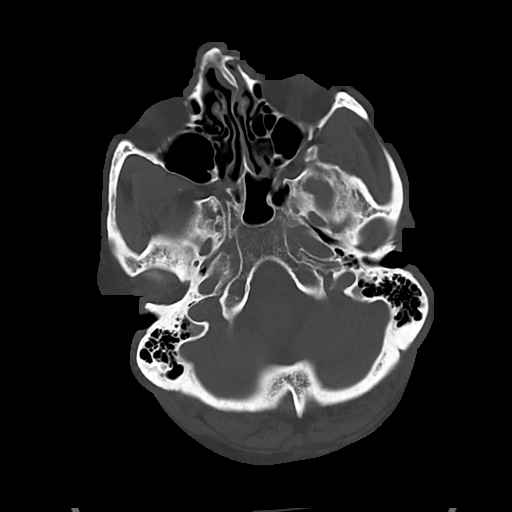
[im 27/90  bone]
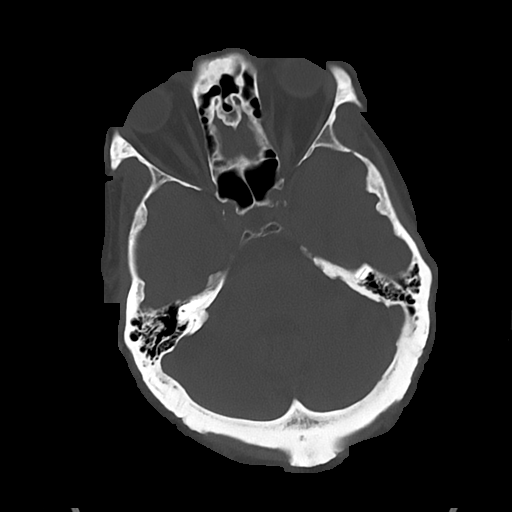

[Series 5: cor soft · coronal · 0.46mm/px · 3 of 80 slices shown]
[im 27/80  brain]
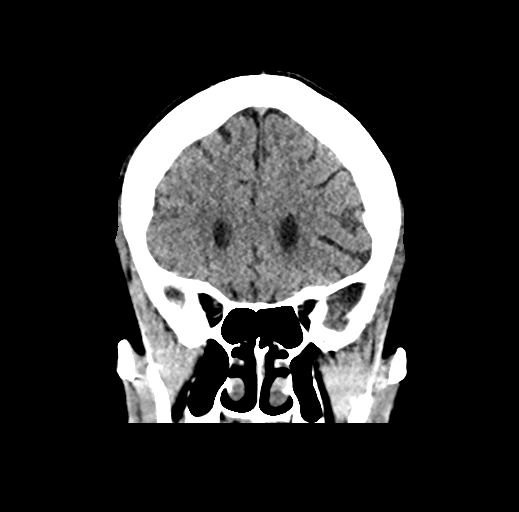
[im 36/80  brain]
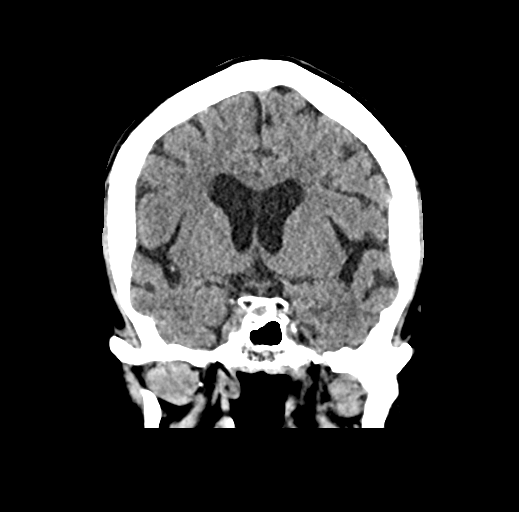
[im 44/80  brain]
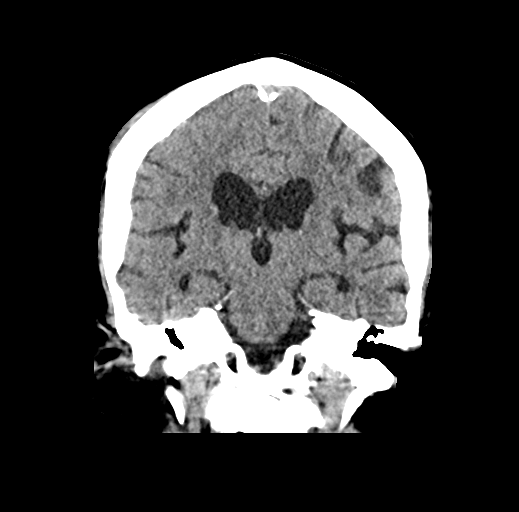

[Series 6: sag soft · sagittal · 0.42mm/px · 3 of 65 slices shown]
[im 22/65  brain]
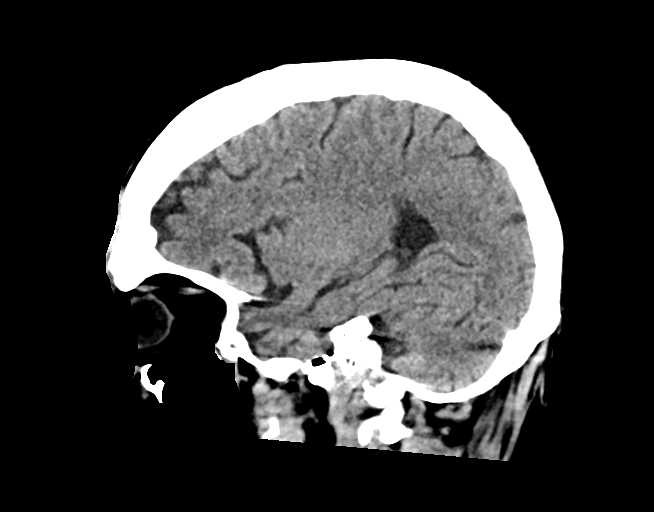
[im 33/65  brain]
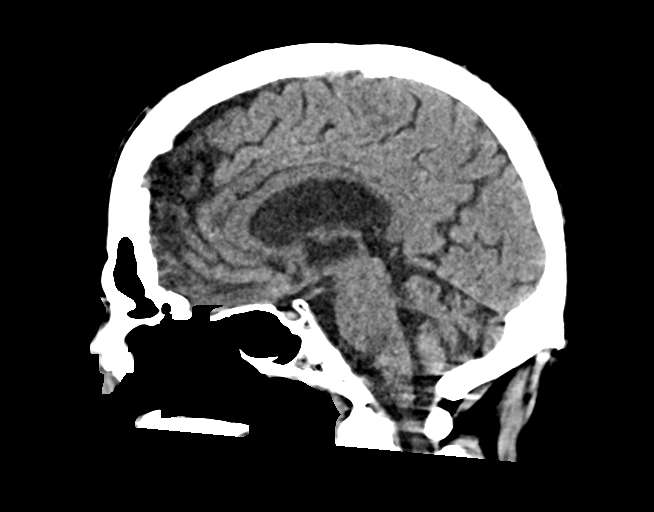
[im 43/65  brain]
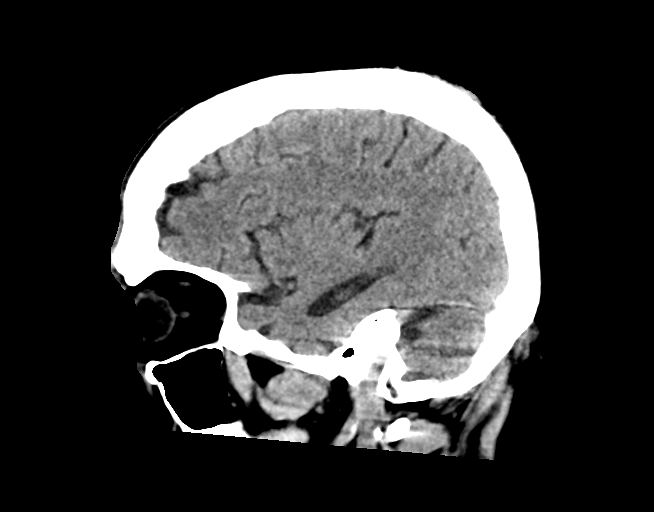

[16 of 47 positions shown; findings below may reference images not displayed]

FINDINGS: CT HEAD FINDINGS

Brain: The ventricles and sulci are appropriate size for patient's
age. The gray-white matter discrimination is preserved. There is no
acute intracranial hemorrhage. No mass effect or midline shift. No
extra-axial fluid collection.

Vascular: No hyperdense vessel or unexpected calcification.

Skull: Normal. Negative for fracture or focal lesion.

Sinuses/Orbits: Mild mucoperiosteal thickening of paranasal sinuses.
No air-fluid level. Minimal left mastoid effusions. There is
depressed nasal bone fracture as seen previously. There is fracture
of the anterior nasal septum with bowing of the septum to the left
and deviation of the nose to the right. There is perforation of the
inferior nasal septum which can be seen in the setting of substance
use.

Other: Mild contusion over the forehead.

CT CERVICAL SPINE FINDINGS

Alignment: No acute subluxation. There is reversal of normal
cervical lordosis which may be positional or due to muscle spasm or
secondary to degenerative changes.

Skull base and vertebrae: No acute fracture.

Soft tissues and spinal canal: No prevertebral fluid or swelling. No
visible canal hematoma.

Disc levels: Multilevel degenerative changes with bone spurring and
osteophyte.

Upper chest: Partially visualized right pleural effusion. Diffuse
interstitial hazy density may represent edema.

Other: None
IMPRESSION: 1. No acute intracranial pathology.
2. No acute/traumatic cervical spine pathology.
3. Depressed nasal bone fracture and fracture of the anterior nasal
septum.
4. Partially visualized right pleural effusion and findings
suggestive of pulmonary edema.

## 2020-04-02 IMAGING — CT CT ABD-PELV W/O CM
2 of 4 series · 14 of 46 positions shown, 16 images · non-contrast
Comparison: January 25, 2018

CLINICAL DATA: Abdominal trauma. Unwitnessed fall without change in
behavior.

EXAM:
CT ABDOMEN AND PELVIS WITHOUT CONTRAST
TECHNIQUE: Multidetector CT imaging of the abdomen and pelvis was performed
following the standard protocol without IV contrast.

[Series 4: ap without · axial · non-contrast · 0.98mm/px · z∈[+540,+1035]mm · 11 of 115 slices shown, 13 images]
[im 8/115  soft-tissue]
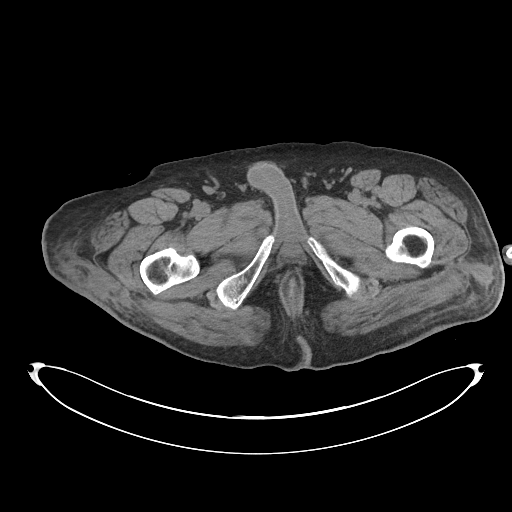
[im 8/115  bone]
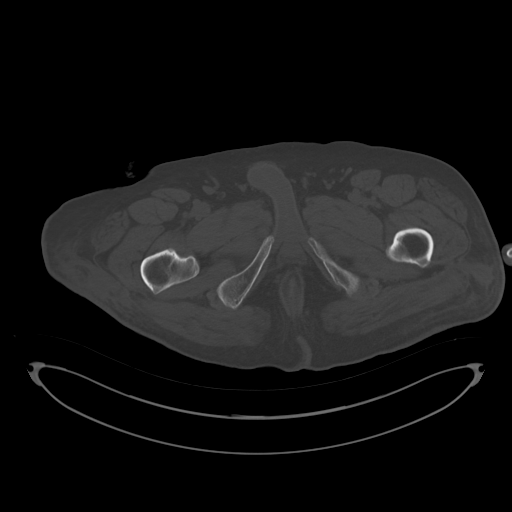
[im 22/115  soft-tissue]
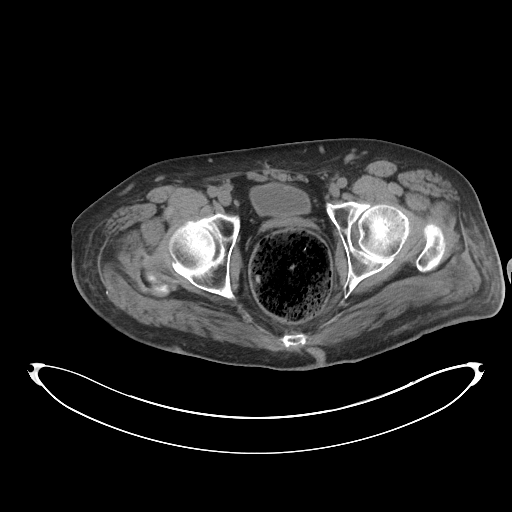
[im 29/115  soft-tissue]
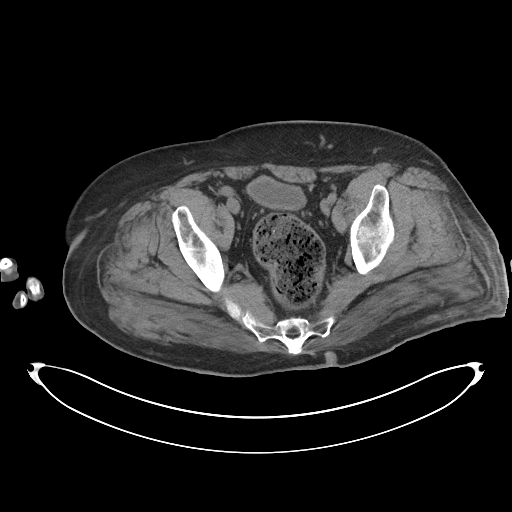
[im 36/115  soft-tissue]
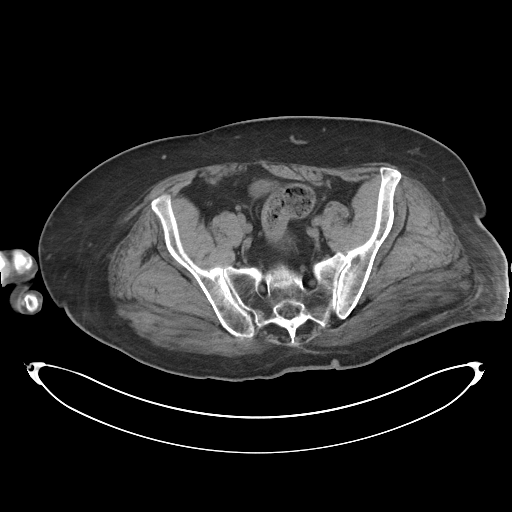
[im 50/115  soft-tissue]
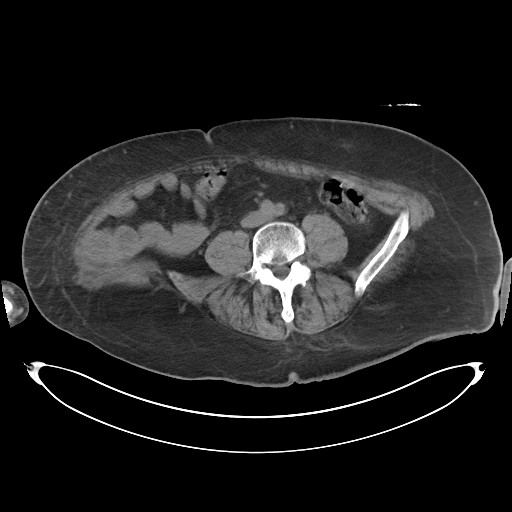
[im 58/115  soft-tissue]
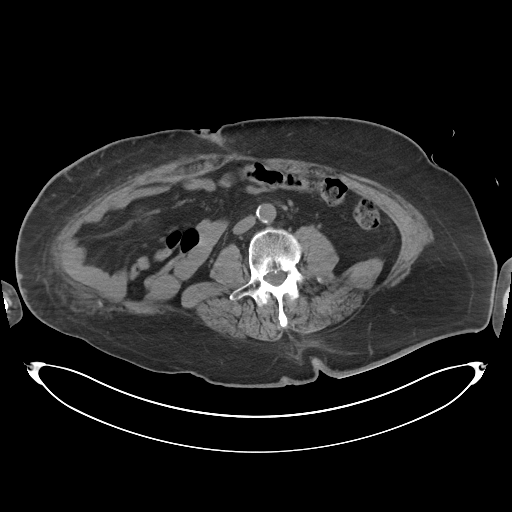
[im 65/115  soft-tissue]
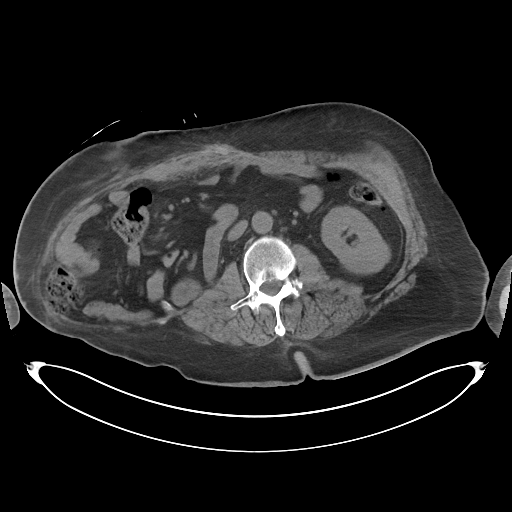
[im 79/115  soft-tissue]
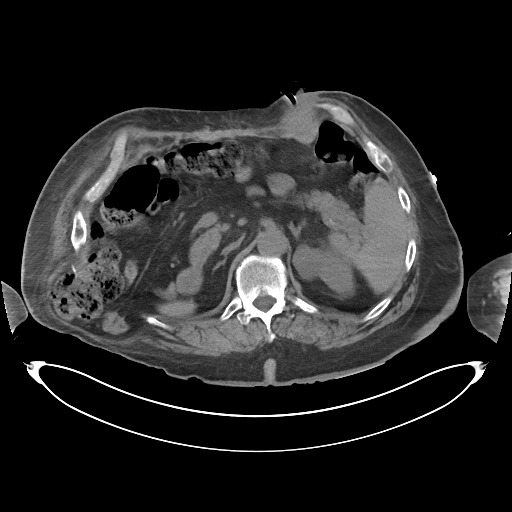
[im 86/115  soft-tissue]
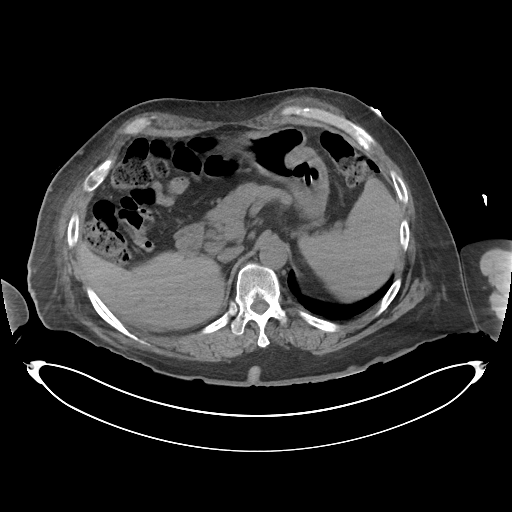
[im 86/115  bone]
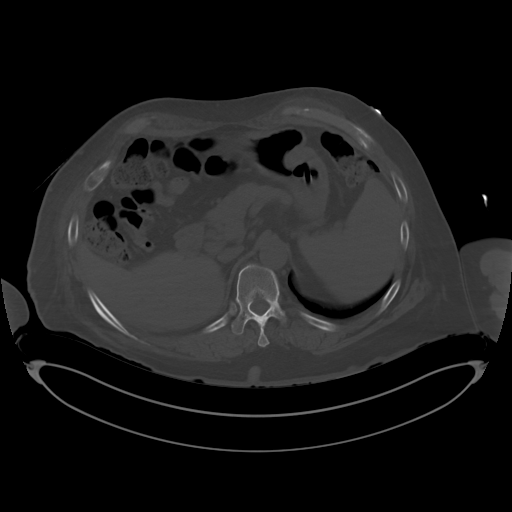
[im 93/115  soft-tissue]
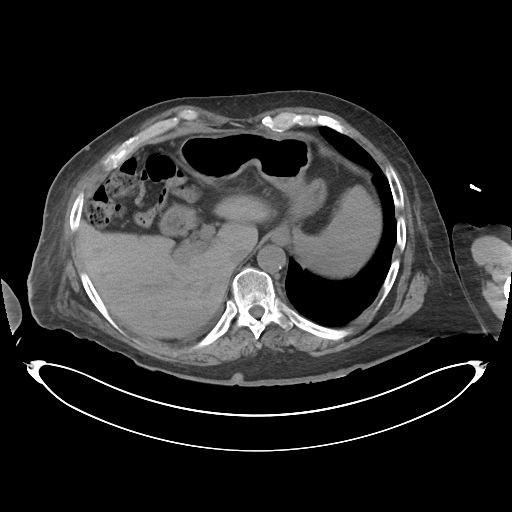
[im 107/115  soft-tissue]
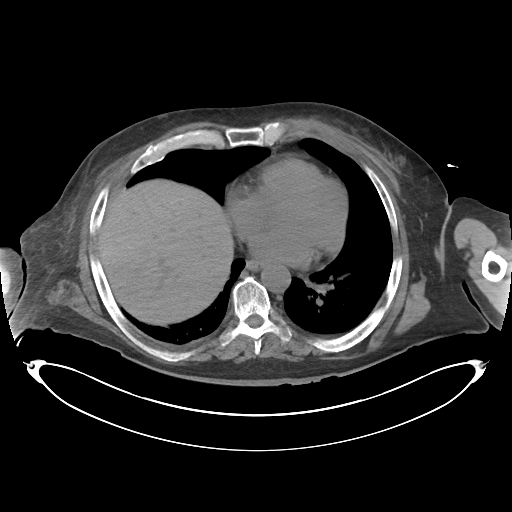

[Series 7: cor · coronal · 1.02mm/px · 3 of 100 slices shown]
[im 34/100  soft-tissue]
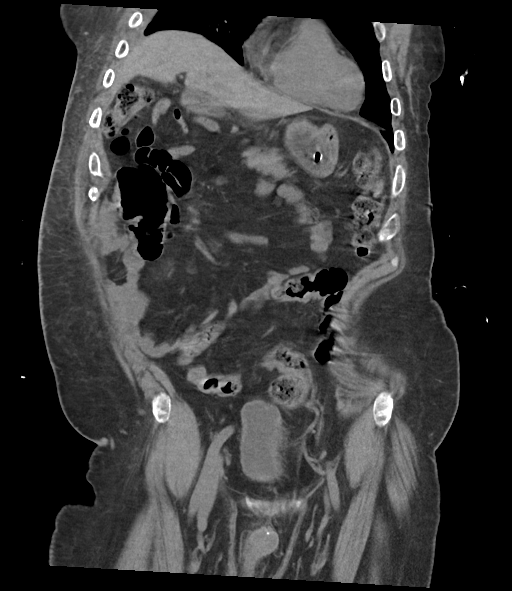
[im 45/100  soft-tissue]
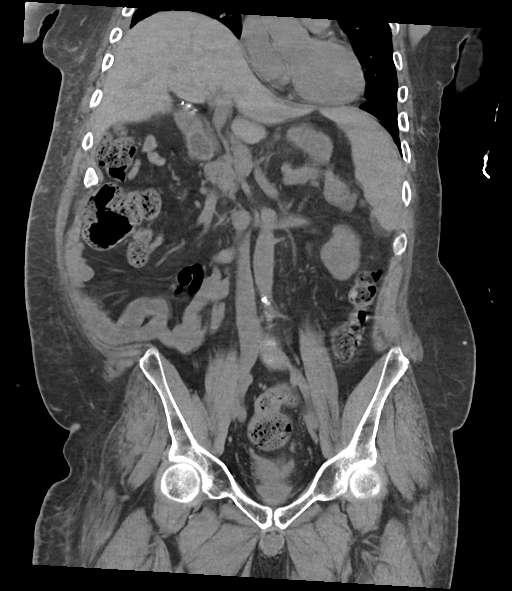
[im 56/100  soft-tissue]
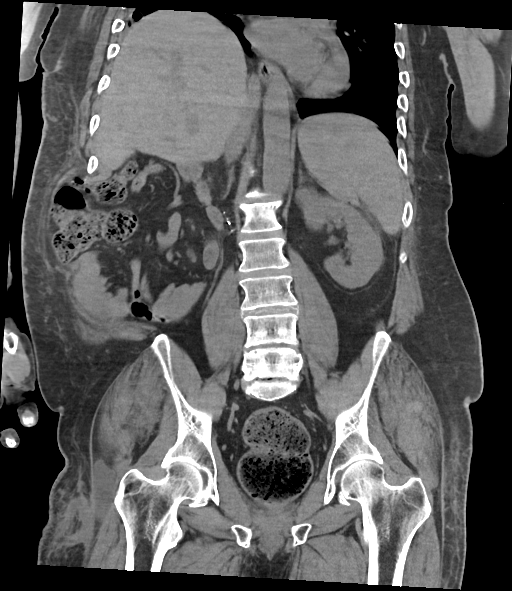

[14 of 46 positions shown; findings below may reference images not displayed]

FINDINGS: Lower chest: Trace left pleural fluid. Small 18 Hounsfield unit
right pleural effusion. Upper limits of normal heart size with mild
LAD and left circumflex coronary calcification. Bilateral
gynecomastia. Mild aortic valvular calcification. Patchy pulmonary
disease in the lateral right base and right middle lobe, probably
atelectatic. Central pulmonary vascular congestion without overt
pulmonary edema.

Hepatobiliary: Surgical clips in the gallbladder fossa. Mild 9 mm
dilatation of the proximal common bile duct with normal distal
tapering. Normally sized liver with fatty infiltration of the
parenchyma. A 5 mm hypoattenuating lesion in the right hepatic
parenchyma, likely an incidental cyst or hemangioma in the absence
of a known malignancy; no additional imaging characterization is
indicated.

Pancreas: Mild atrophy. No acute pancreatitis or sequela of chronic
pancreatitis.

Spleen: Normal.

Adrenals/Urinary Tract: Normal bilateral adrenal glands. Abnormal
left renal upper pole exophytic 3.0 by 3.4 by 3.4 cm thirty-four
Hounsfield unit soft tissue attenuation mass with lobulated
contours, axial series 4, image 38 and coronal series 7, image 60,
incompletely characterized and possibly benign or malignant. Remote
right nephrectomy. No left hydroureteronephrosis or calcified stone.
Mild left perinephric inflammatory change, probably sequela of
medical renal disease. Subtle perivesicular stranding.

Stomach/Bowel: Appropriate gastrostomy tube placement within the
gastric fundus lumen through a well-formed fibrotic tract through
the abdominal wall. Moderate stool burden without bowel obstruction.
Steatotic stool impaction distending the rectum 8 cm diameter.

Vascular/Lymphatic: Small subcentimeter nonspecific retroperitoneal
lymph nodes. Thoracoabdominal aortic calcified atherosclerosis
without an abdominal aortic aneurysm.

Reproductive: Mild prostatomegaly.

Other: No retroperitoneal hematoma, free intraperitoneal fluid or
free intraperitoneal air.

Musculoskeletal: An acute nondisplaced fracture of the right fourth
and fifth rib. Angular contours of the right 12th, 9th, 8th, 7th,
and sixth ribs. Chronic fracture deformity-nonunion of the L3 and L4
left transverse processes and right twelfth rib with well corticated
margins. Grade 1-2 anterolisthesis of L5 on S1 with bilateral L5
pars interarticularis defects. No acute lumbar compression fracture.
IMPRESSION: Acute right fourth and fifth rib nondisplaced fractures with small
right 18 Hounsfield unit pleural effusion and patchy right basilar
pulmonary disease that could represent contusion or atelectasis.
Angular contours of the lower right ribs that could represent acute
or chronic fracture deformities. Partially imaged thorax without an
obvious pneumothorax or mediastinal shift. Dedicated frontal chest
radiography recommended.

No retroperitoneal hematoma or free intraperitoneal fluid or
apparent abdominopelvic visceral injury.

Abnormal left renal upper pole exophytic 3.0 x 3.4 x 3.4 cm 34
Hounsfield unit soft tissue attenuation mass, possibly a benign
hemorrhagic cyst versus a benign or malignant renal neoplasm. Remote
right nephrectomy. Further evaluation with nonemergent MR or CT
abdomen without and with intravenous contrast renal protocol
recommended.

Trace left pleural effusion. Central pulmonary vascular congestion
without pulmonary edema. Two vessel coronary calcification.
Thoracoabdominal calcified atherosclerosis.

Subtle perivesicular stranding, concerning for possible mild acute
cystitis.

Rectal stool impaction.

## 2020-04-06 IMAGING — CT CT HEAD W/O CM
3 series · 14 of 47 positions shown, 16 images · non-contrast
Comparison: February 26, 2020.

CLINICAL DATA: Head laceration after fall.

EXAM:
CT HEAD WITHOUT CONTRAST
CT CERVICAL SPINE WITHOUT CONTRAST
TECHNIQUE: Multidetector CT imaging of the head and cervical spine was
performed following the standard protocol without intravenous
contrast. Multiplanar CT image reconstructions of the cervical spine
were also generated.

[Series 2: head wo · axial · 0.47mm/px · z∈[-175,-45]mm · 8 of 32 slices shown, 10 images]
[im 3/32  brain]
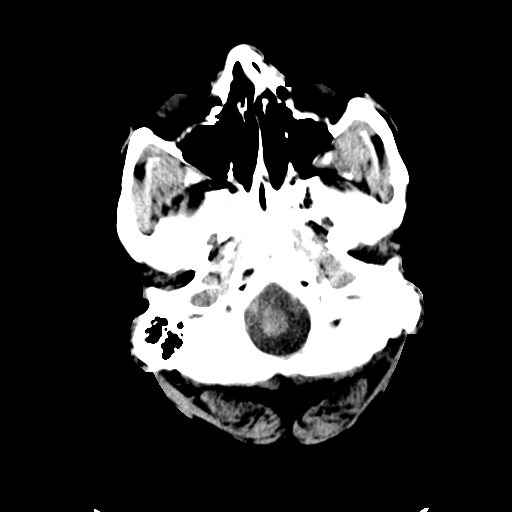
[im 3/32  bone]
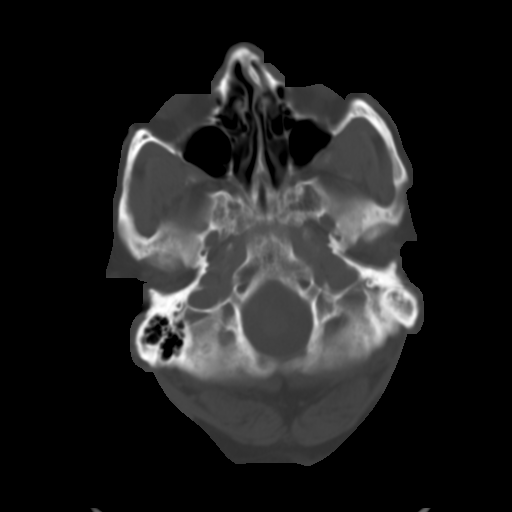
[im 7/32  brain]
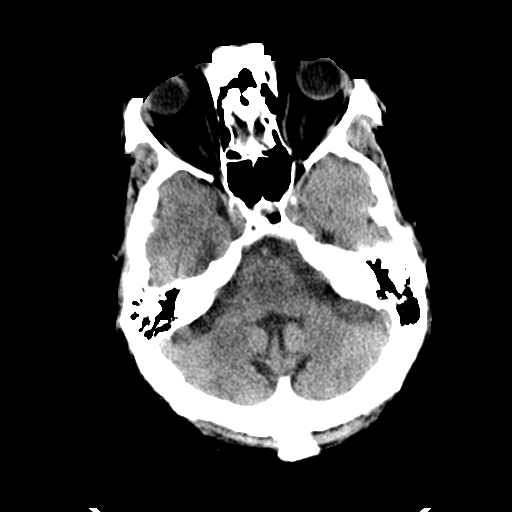
[im 10/32  brain]
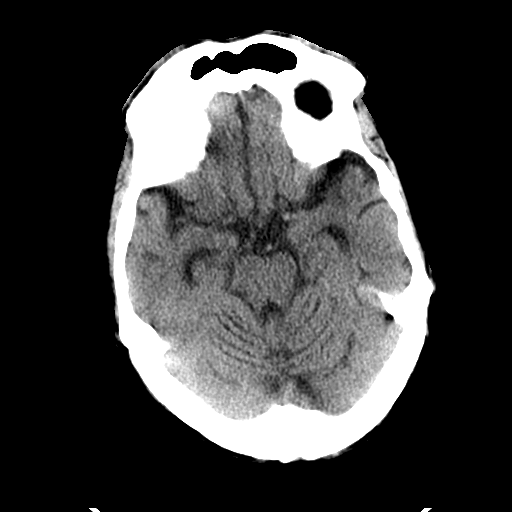
[im 14/32  brain]
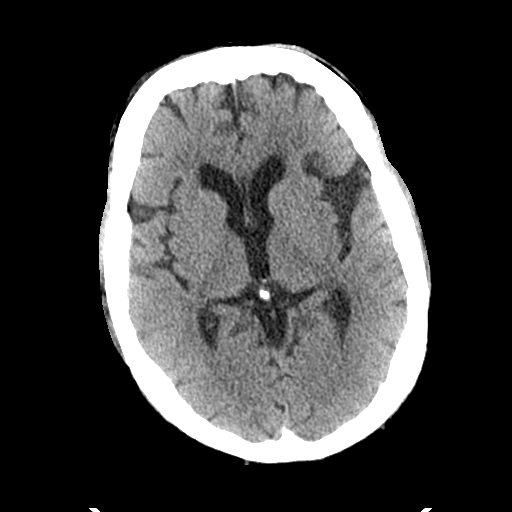
[im 18/32  brain]
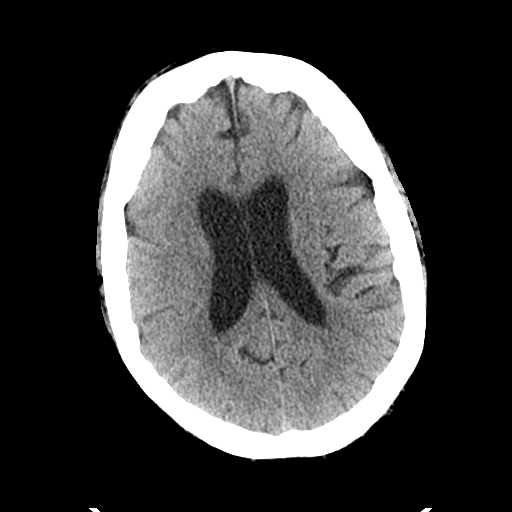
[im 18/32  bone]
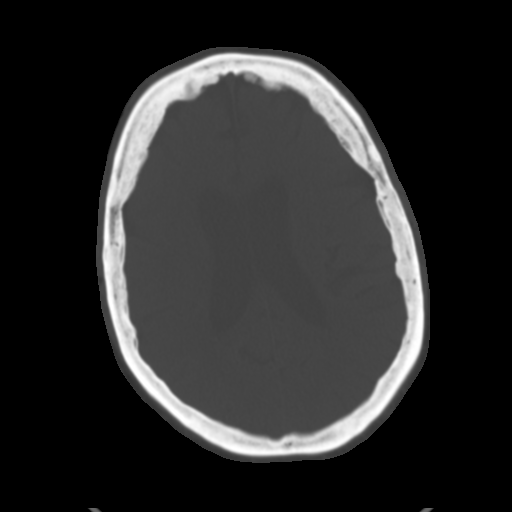
[im 22/32  brain]
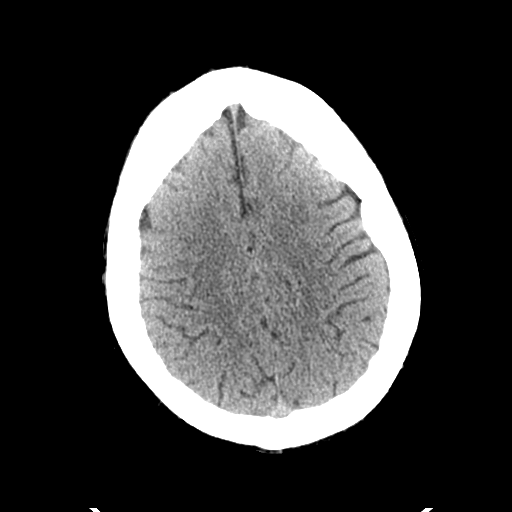
[im 25/32  brain]
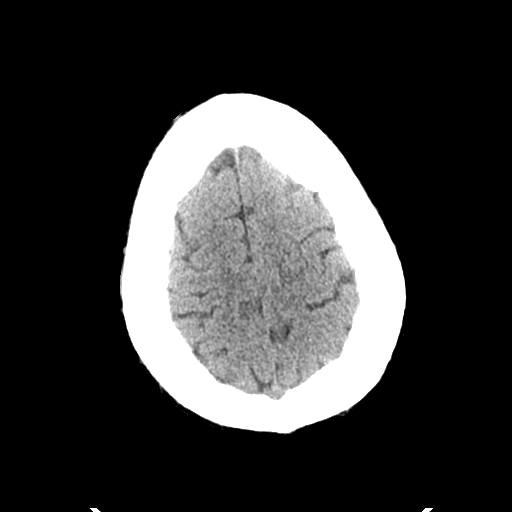
[im 29/32  brain]
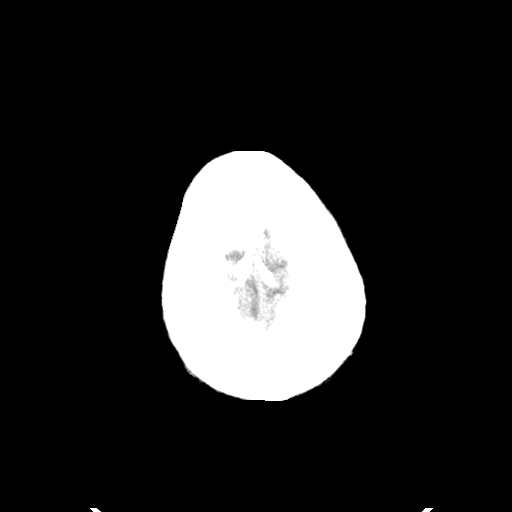

[Series 4: coronal soft tissue · coronal · 0.32mm/px · 3 of 73 slices shown]
[im 25/73  brain]
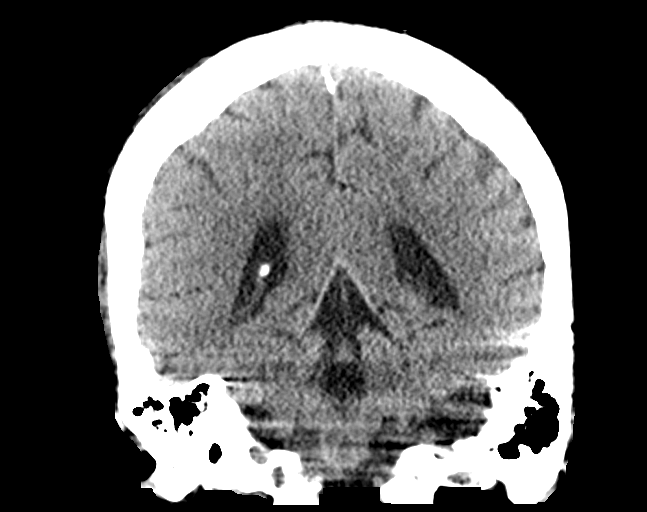
[im 33/73  brain]
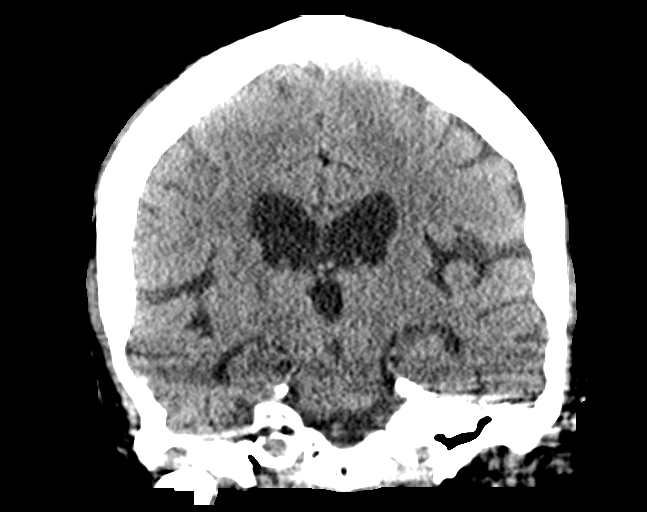
[im 41/73  brain]
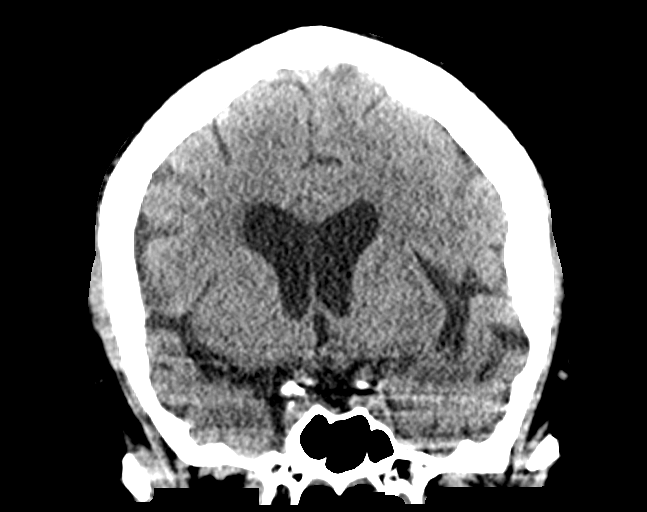

[Series 5: sagittal soft tissue · sagittal · 0.31mm/px · 3 of 64 slices shown]
[im 22/64  brain]
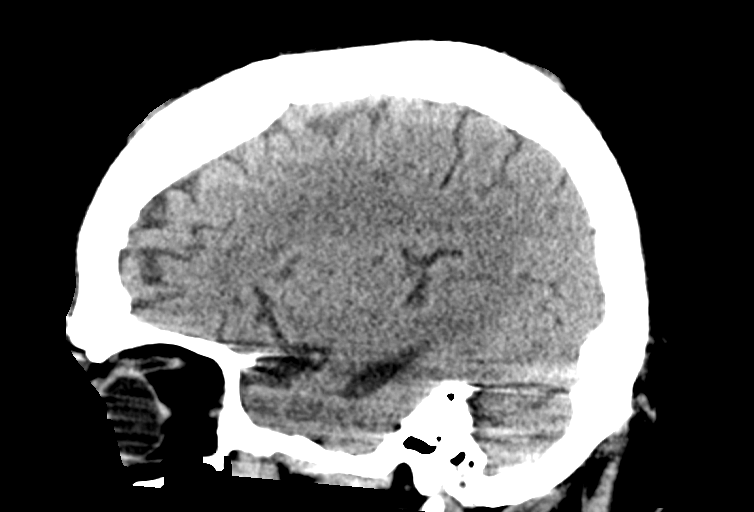
[im 32/64  brain]
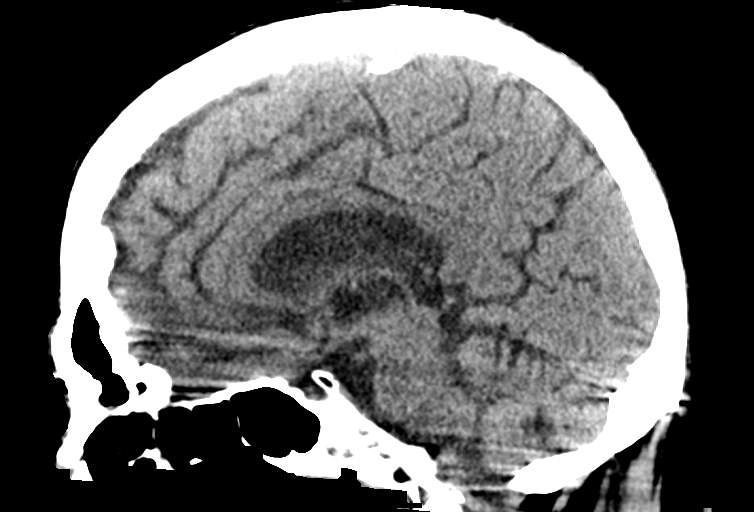
[im 43/64  brain]
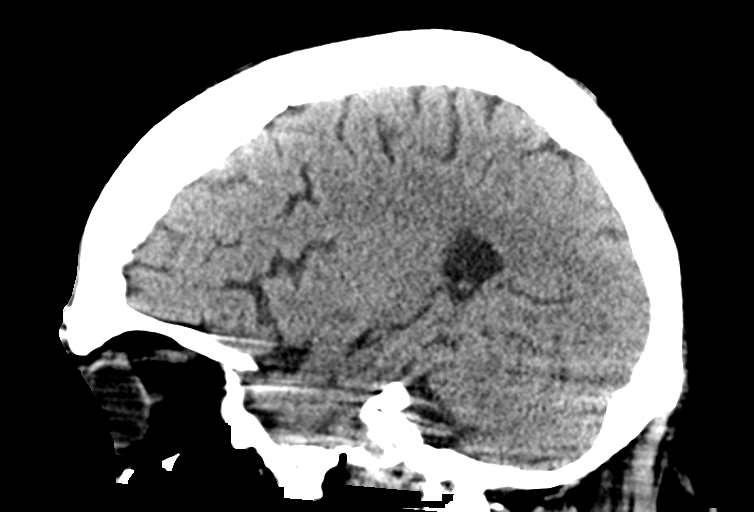

[14 of 47 positions shown; findings below may reference images not displayed]

FINDINGS: CT HEAD FINDINGS

Brain: Mild chronic ischemic white matter disease is noted. Mild
diffuse cortical atrophy is noted. No mass effect or midline shift
is noted. Ventricular size is within normal limits. There is no
evidence of mass lesion, hemorrhage or acute infarction.

Vascular: No hyperdense vessel or unexpected calcification.

Skull: Normal. Negative for fracture or focal lesion.

Sinuses/Orbits: Depressed nasal bone fracture is again noted.
Sinuses are unremarkable.

Other: Small left supraorbital scalp hematoma is noted.

CT CERVICAL SPINE FINDINGS

Alignment: Normal.

Skull base and vertebrae: No acute fracture. No primary bone lesion
or focal pathologic process.

Soft tissues and spinal canal: No prevertebral fluid or swelling. No
visible canal hematoma.

Disc levels: Moderate degenerative disc disease is noted at C3-4,
C4-5, C5-6 and C6-7 with anterior osteophyte formation.

Upper chest: Negative.

Other: None.
IMPRESSION: 1. Mild chronic ischemic white matter disease. Mild diffuse cortical
atrophy. Small left supraorbital scalp hematoma. No acute
intracranial abnormality seen.
2. Moderate multilevel degenerative disc disease. No acute
abnormality seen in the cervical spine.

## 2020-04-06 IMAGING — CT CT CERVICAL SPINE W/O CM
4 of 8 series · 10 of 33 positions shown, 11 images · non-contrast
Comparison: February 26, 2020.

CLINICAL DATA: Head laceration after fall.

EXAM:
CT HEAD WITHOUT CONTRAST
CT CERVICAL SPINE WITHOUT CONTRAST
TECHNIQUE: Multidetector CT imaging of the head and cervical spine was
performed following the standard protocol without intravenous
contrast. Multiplanar CT image reconstructions of the cervical spine
were also generated.

[Series 3: c spine soft · axial · 0.27mm/px · z∈[-289,-229]mm · 2 of 92 slices shown]
[im 31/92  soft-tissue]
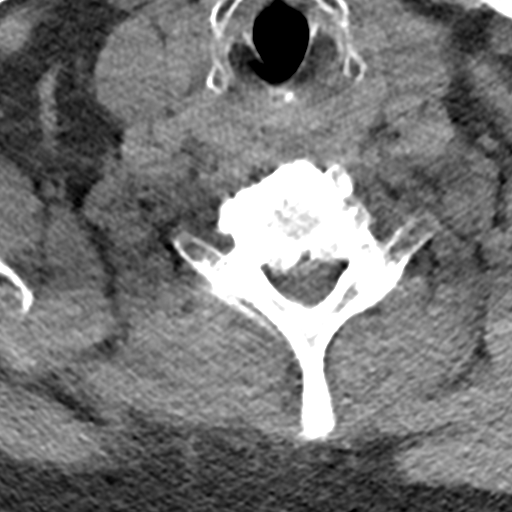
[im 61/92  soft-tissue]
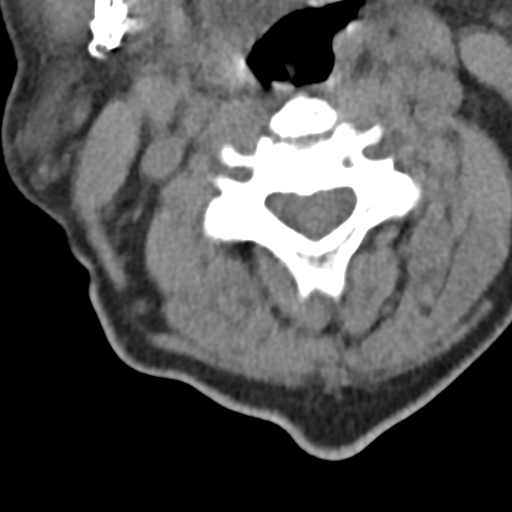

[Series 4: sagittal bone · sagittal · 0.27mm/px · 5 of 61 slices shown]
[im 11/61  bone]
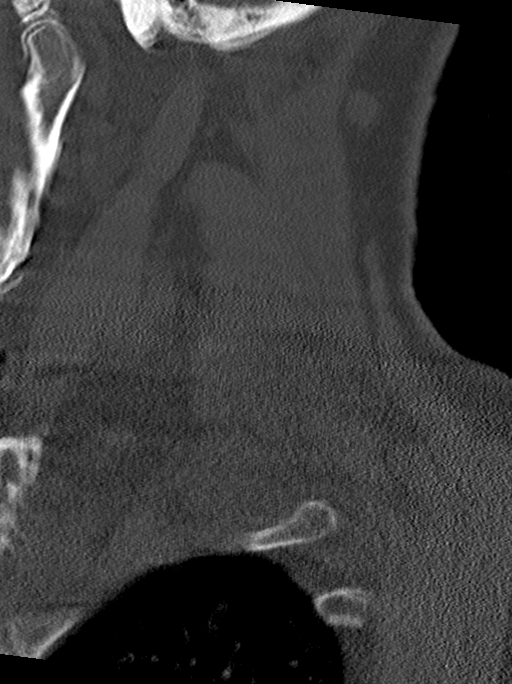
[im 21/61  bone]
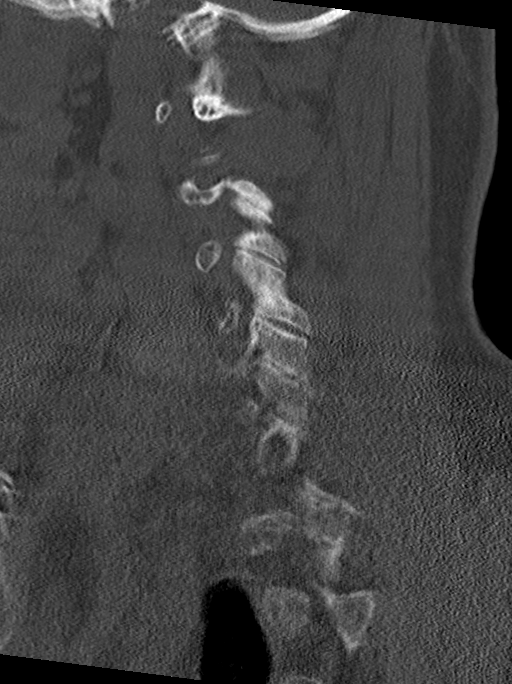
[im 31/61  bone]
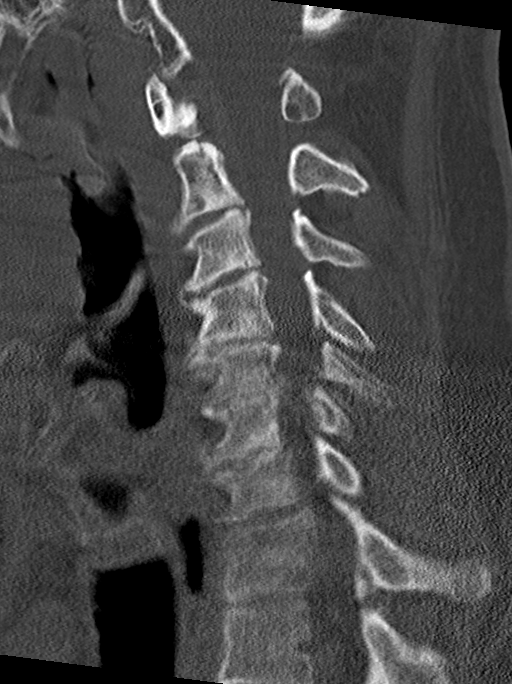
[im 41/61  bone]
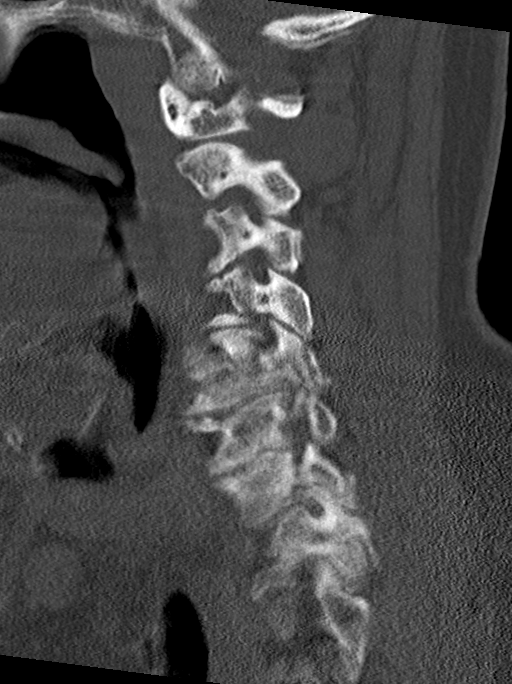
[im 51/61  bone]
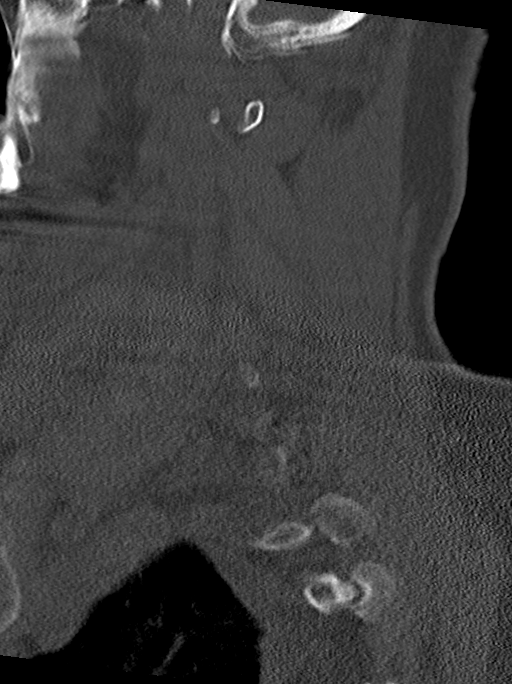

[Series 6: orthogonal bone · axial · 0.23mm/px · z∈[-298,-234]mm · 2 of 96 slices shown, 3 images]
[im 32/96  soft-tissue]
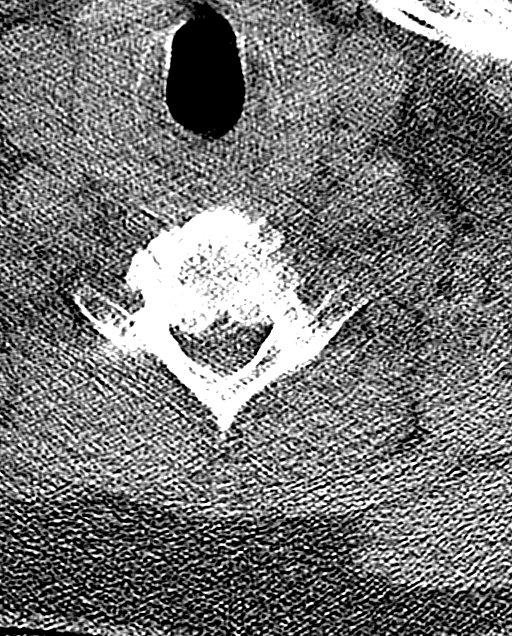
[im 32/96  bone]
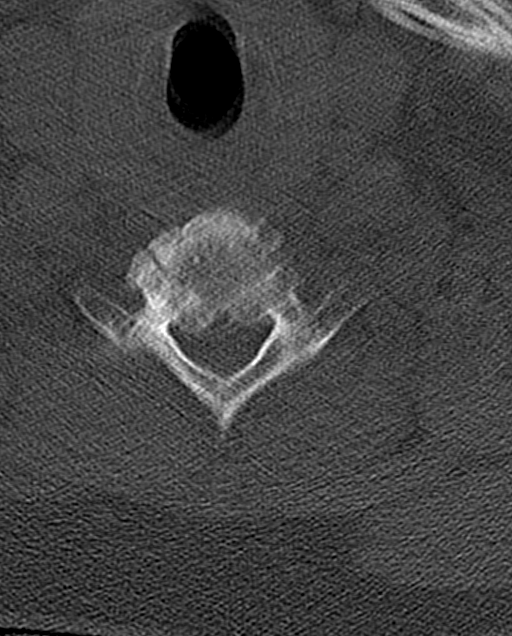
[im 64/96  bone]
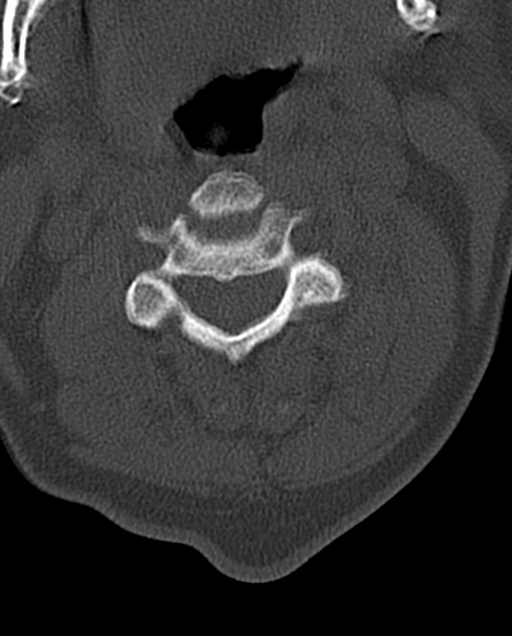

[Series 12: coronal bone · coronal · 0.27mm/px · 1 of 70 slices shown]
[im 35/70  bone]
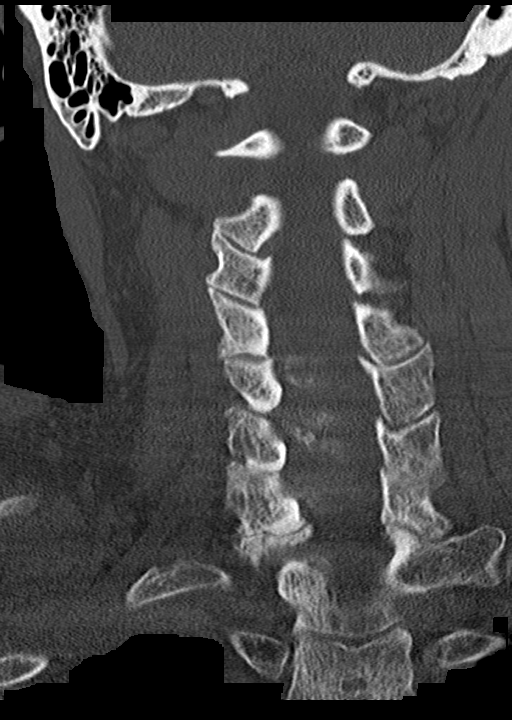

[10 of 33 positions shown; findings below may reference images not displayed]

FINDINGS: CT HEAD FINDINGS

Brain: Mild chronic ischemic white matter disease is noted. Mild
diffuse cortical atrophy is noted. No mass effect or midline shift
is noted. Ventricular size is within normal limits. There is no
evidence of mass lesion, hemorrhage or acute infarction.

Vascular: No hyperdense vessel or unexpected calcification.

Skull: Normal. Negative for fracture or focal lesion.

Sinuses/Orbits: Depressed nasal bone fracture is again noted.
Sinuses are unremarkable.

Other: Small left supraorbital scalp hematoma is noted.

CT CERVICAL SPINE FINDINGS

Alignment: Normal.

Skull base and vertebrae: No acute fracture. No primary bone lesion
or focal pathologic process.

Soft tissues and spinal canal: No prevertebral fluid or swelling. No
visible canal hematoma.

Disc levels: Moderate degenerative disc disease is noted at C3-4,
C4-5, C5-6 and C6-7 with anterior osteophyte formation.

Upper chest: Negative.

Other: None.
IMPRESSION: 1. Mild chronic ischemic white matter disease. Mild diffuse cortical
atrophy. Small left supraorbital scalp hematoma. No acute
intracranial abnormality seen.
2. Moderate multilevel degenerative disc disease. No acute
abnormality seen in the cervical spine.

## 2020-05-02 ENCOUNTER — Ambulatory Visit: Payer: Medicare Other | Admitting: Neurology

## 2020-05-22 ENCOUNTER — Emergency Department (HOSPITAL_COMMUNITY)
Admission: EM | Admit: 2020-05-22 | Discharge: 2020-05-22 | Disposition: A | Discharge status: Home / Self Care | Payer: Medicare Other | Attending: Emergency Medicine | Admitting: Emergency Medicine

## 2020-05-22 ENCOUNTER — Encounter (HOSPITAL_COMMUNITY): Payer: Self-pay

## 2020-05-22 ENCOUNTER — Emergency Department (HOSPITAL_COMMUNITY): Payer: Medicare Other

## 2020-05-22 ENCOUNTER — Other Ambulatory Visit: Payer: Self-pay

## 2020-05-22 DIAGNOSIS — I4891 Unspecified atrial fibrillation: Secondary | ICD-10-CM | POA: Insufficient documentation

## 2020-05-22 DIAGNOSIS — Z91040 Latex allergy status: Secondary | ICD-10-CM | POA: Insufficient documentation

## 2020-05-22 DIAGNOSIS — S0181XA Laceration without foreign body of other part of head, initial encounter: Secondary | ICD-10-CM

## 2020-05-22 DIAGNOSIS — Z7982 Long term (current) use of aspirin: Secondary | ICD-10-CM | POA: Diagnosis not present

## 2020-05-22 DIAGNOSIS — S0990XA Unspecified injury of head, initial encounter: Secondary | ICD-10-CM | POA: Diagnosis present

## 2020-05-22 DIAGNOSIS — F0390 Unspecified dementia without behavioral disturbance: Secondary | ICD-10-CM | POA: Insufficient documentation

## 2020-05-22 DIAGNOSIS — Y939 Activity, unspecified: Secondary | ICD-10-CM | POA: Diagnosis not present

## 2020-05-22 DIAGNOSIS — S01112A Laceration without foreign body of left eyelid and periocular area, initial encounter: Secondary | ICD-10-CM | POA: Insufficient documentation

## 2020-05-22 DIAGNOSIS — S42291S Other displaced fracture of upper end of right humerus, sequela: Secondary | ICD-10-CM

## 2020-05-22 DIAGNOSIS — E039 Hypothyroidism, unspecified: Secondary | ICD-10-CM | POA: Insufficient documentation

## 2020-05-22 DIAGNOSIS — W19XXXA Unspecified fall, initial encounter: Secondary | ICD-10-CM | POA: Insufficient documentation

## 2020-05-22 DIAGNOSIS — Z79899 Other long term (current) drug therapy: Secondary | ICD-10-CM | POA: Diagnosis not present

## 2020-05-22 DIAGNOSIS — I252 Old myocardial infarction: Secondary | ICD-10-CM | POA: Insufficient documentation

## 2020-05-22 DIAGNOSIS — Y999 Unspecified external cause status: Secondary | ICD-10-CM | POA: Diagnosis not present

## 2020-05-22 DIAGNOSIS — Y92129 Unspecified place in nursing home as the place of occurrence of the external cause: Secondary | ICD-10-CM | POA: Diagnosis not present

## 2020-05-22 MED ORDER — LIDOCAINE-EPINEPHRINE 2 %-1:100000 IJ SOLN
20.0000 mL | Freq: Once | INTRAMUSCULAR | Status: AC
  Administered 2020-05-22: 5 mL via INTRADERMAL
  Filled 2020-05-22: qty 1

## 2020-05-22 MED ORDER — LIDOCAINE-EPINEPHRINE-TETRACAINE (LET) TOPICAL GEL
3.0000 mL | Freq: Once | TOPICAL | Status: AC
  Administered 2020-05-22: 3 mL via TOPICAL
  Filled 2020-05-22: qty 3

## 2020-05-22 NOTE — ED Provider Notes (Signed)
[facility]-EMERGENCY DEPT Provider Note   CSN: [id number] Arrival date & time: 05/22/20  0025     History Chief Complaint  Patient presents with  . Fall  . Laceration    [name] is a 54 y.o. male.  The history is provided by the EMS personnel. The history is limited by the condition of the patient.  Fall This is a new problem. The current episode started less than 1 hour ago. The problem occurs constantly. The problem has not changed since onset.Pertinent negatives include no chest pain. Nothing aggravates the symptoms. Nothing relieves the symptoms. He has tried nothing for the symptoms. The treatment provided moderate relief.  Laceration Location:  Face Facial laceration location: just below left eyebrow  Length:  2 Depth:  Cutaneous Quality: straight   Bleeding: controlled   Laceration mechanism:  Fall Pain details:    Severity:  No pain Foreign body present:  No foreign bodies Relieved by:  Nothing Worsened by:  Nothing Ineffective treatments:  None tried Tetanus status:  Up to date Associated symptoms: no fever and no focal weakness        Past Medical History:  Diagnosis Date  . Addison disease (HCC)   . Anoxic brain injury (HCC)   . Anxiety   . Aspiration pneumonia (HCC)   . Atrial fibrillation (HCC)   . Chronic constipation 11/23/2011  . Dementia (HCC)   . Dysphagia   . Encephalopathy   . Glaucoma   . Glucocorticoid deficiency (HCC)   . History of recurrent UTIs   . Hyperlipemia   . Hypothyroidism   . Mental disorder   . Myocardial infarction (HCC)   . Pneumoperitoneum of unknown etiology 12/21/2011  . Quadriplegia (HCC)   . Reflux   . Seizure disorder [facility])     Patient Active Problem List   Diagnosis Date Noted  . PEG Gastrostomy tube in place (HCC) 01/26/2018  . 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 01/26/2018 01/26/2018  . Bedridden 01/26/2018  . Quadriplegia (HCC)   . Abdominal wall abscess  01/25/2018  . Fracture of humerus anatomical neck, right, closed, initial encounter 10/03/2017  . AKI (acute kidney injury) (HCC) 10/03/2017  . UTI (urinary tract infection) 10/03/2017  . Agitation 07/03/2014  . Cardiomyopathy, ischemic 06/18/2014  . 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 Acute psychosis (HCC) 04/19/2014  . Unspecified hypothyroidism 05/16/2013  . Constipation 05/16/2013  . Anemia of other chronic disease 05/16/2013  . Mental disorder 01/03/2013  . Addisons disease (HCC) 01/03/2013  . PEG (percutaneous endoscopic gastrostomy) adjustment/replacement/removal (HCC) 12/21/2011  . Hypokalemia 11/25/2011  . Thrombocytopenia (HCC) 11/25/2011  . FTT (failure to thrive) in adult 11/25/2011  . Grand mal seizure (HCC) 11/23/2011  . Depression (emotion) 11/23/2011  . History of atrial fibrillation 11/23/2011  . Other specified hemorrhagic conditions (HCC) 11/23/2011  . Glaucoma 11/23/2011  . Hypoglycemia 11/23/2011  . Chronic constipation 11/23/2011  . Anoxic brain injury (HCC) 11/12/2011  . Protein calorie malnutrition (HCC) 11/12/2011  . Hyperchloremia 11/12/2011  . Hyperthyroidism 11/12/2011  . Acute lower UTI 11/12/2011  . Dysphagia     Past Surgical History:  Procedure Laterality Date  . DEBRIDEMENT OF ABDOMINAL WALL ABSCESS  01/26/2018  . HERNIA MESH REMOVAL  01/26/2018   Infected mesh - removed w I&D abd wall abscess  . IR GENERIC HISTORICAL  10/20/2016   IR REPLC GASTRO/COLONIC TUBE PERCUT W/FLUORO 10/20/2016 Oley Balm, MD [facility]-INTER[facility]  . IR REPLACE G-TUBE SIMPLE WO FLUORO  01/28/2018  .  IR REPLC GASTRO/COLONIC TUBE PERCUT W/FLUORO  04/21/2017  . IR REPLC GASTRO/COLONIC TUBE PERCUT W/FLUORO  07/22/2017  . IR REPLC GASTRO/COLONIC TUBE PERCUT W/FLUORO  11/24/2017  . IRRIGATION AND DEBRIDEMENT ABSCESS N/A 01/26/2018   Procedure: IRRIGATION AND DEBRIDEMENT ABDOMINAL WALL ABSCESS WITH REMOVAL OF INFECTED MESH;  Surgeon: Karie Soda, MD;  Location: [facility] ORS;  Service:  General;  Laterality: N/A;  . NEPHRECTOMY  unknown  . PEG TUBE PLACEMENT         History reviewed. No pertinent family history.  Social History   Tobacco Use  . Smoking status: Never Smoker  . Smokeless tobacco: Never Used  Substance Use Topics  . Alcohol use: No  . Drug use: No    Home Medications Prior to Admission medications   Medication Sig Start Date End Date Taking? Authorizing Provider  acetaminophen (TYLENOL) 325 MG tablet Place 650 mg into feeding tube every 4 (four) hours as needed for mild pain.   Yes [provider]  Amino Acids-Protein Hydrolys (FEEDING SUPPLEMENT, PRO-STAT SUGAR FREE 64,) LIQD Place 30 mLs into feeding tube 2 (two) times daily.    Yes [provider]  aspirin 81 MG chewable tablet Chew 81 mg by mouth daily.   Yes [provider]  Carboxymeth-Glycerin-Polysorb (REFRESH OPTIVE ADVANCED OP) Place 1 drop into both eyes 3 (three) times daily.   Yes [provider]  clonazePAM (KLONOPIN) 0.5 MG tablet Take 0.5 mg by mouth at bedtime.   Yes [provider]  divalproex (DEPAKOTE SPRINKLE) 125 MG capsule Take 500 mg by mouth 2 (two) times daily.  07/18/13  Yes [provider]  levothyroxine (SYNTHROID, LEVOTHROID) 150 MCG tablet Place 150 mcg into feeding tube daily before breakfast.    Yes [provider]  metoprolol tartrate (LOPRESSOR) 25 MG tablet Take 12.5 mg by mouth 2 (two) times daily.   Yes [provider]  Multiple Vitamins-Minerals (CERTAVITE/ANTIOXIDANTS) LIQD 15 mLs by PEG Tube route daily at 12 noon.    Yes [provider]  pantoprazole sodium (PROTONIX) 40 mg/20 mL PACK 40 mg by PEG Tube route daily at 12 noon.  11/30/11  Yes Davis, Marchelle Folks, MD  polyethylene glycol (MIRALAX / GLYCOLAX) packet 17 g by PEG Tube route 2 (two) times daily. Mix 17gm  In 4-8 ounces of liquid   Yes [provider]  polyvinyl alcohol (LIQUIFILM TEARS) 1.4 % ophthalmic solution Place  1 drop into both eyes 2 (two) times daily.   Yes [provider]  risperiDONE (RISPERDAL M-TABS) 1 MG disintegrating tablet Place 1 mg under the tongue 3 (three) times daily.  05/08/17  Yes [provider]  sennosides (SENOKOT) 8.8 MG/5ML syrup Place 15 mLs into feeding tube 2 (two) times daily.    Yes [provider]  traZODone (DESYREL) 150 MG tablet Take 75 mg by mouth at bedtime.  08/02/13  Yes [provider]    Allergies    Ativan [lorazepam], Latex, Morphine and related, Penicillins, Pyridium [phenazopyridine hcl], and Soy allergy  Review of Systems   Review of Systems  Unable to perform ROS: Other (anoxic brain injury )  Constitutional: Negative for fever.  Cardiovascular: Negative for chest pain.  Skin: Positive for wound.  Neurological: Negative for focal weakness.    Physical Exam Updated Vital Signs BP 112/82 (BP Location: Left Arm)   Pulse 67   Temp 98.8 F (37.1 C) (Oral)   Resp 16   SpO2 98%   Physical Exam Vitals and nursing  note reviewed.  Constitutional:      General: He is not in acute distress.    Appearance: Normal appearance.  HENT:     Head: Normocephalic.      Nose: Nose normal.  Eyes:     Pupils: Pupils are equal, round, and reactive to light.  Cardiovascular:     Rate and Rhythm: Normal rate and regular rhythm.     Pulses: Normal pulses.     Heart sounds: Normal heart sounds.  Pulmonary:     Effort: Pulmonary effort is normal.     Breath sounds: Normal breath sounds.  Abdominal:     General: Abdomen is flat. Bowel sounds are normal.     Palpations: Abdomen is soft.     Tenderness: There is no abdominal tenderness.  Musculoskeletal:     Right shoulder: Deformity present.     Left shoulder: Normal.     Right elbow: Normal.     Left elbow: Normal.     Right forearm: Normal.     Left forearm: Normal.     Right wrist: Normal.     Left wrist: Normal.     Right hand: Normal.     Left hand: Normal.      Cervical back: Normal range of motion and neck supple.  Skin:    General: Skin is warm and dry.     Capillary Refill: Capillary refill takes less than 2 seconds.  Neurological:     General: No focal deficit present.     Mental Status: He is alert.     Deep Tendon Reflexes: Reflexes normal.  Psychiatric:     Comments: Unable      ED Results / Procedures / Treatments   Labs (all labs ordered are listed, but only abnormal results are displayed) Labs Reviewed - No data to display  EKG None  Radiology DG Clavicle Right  Result Date: 05/22/2020 CLINICAL DATA:  Unwitnessed fall at nursing home. EXAM: RIGHT CLAVICLE - 2+ VIEWS COMPARISON:  None. FINDINGS: Cortical margins of the clavicle are intact. There is no evidence of acute fracture. Remote posttraumatic deformity of the right proximal humerus. Soft tissues are unremarkable. IMPRESSION: No fracture of the right clavicle. Electronically Signed   By: Narda RutherfordMelanie  Sanford M.D.   On: 05/22/2020 01:15   DG Shoulder Right  Result Date: 05/22/2020 CLINICAL DATA:  Unwitnessed fall at nursing home with deformity to right shoulder. EXAM: RIGHT SHOULDER - 2+ VIEW COMPARISON:  Radiograph 12/05/2019 FINDINGS: No evidence of acute fracture. Stable appearance of remote proximal humerus fracture with posttraumatic deformity. The acromioclavicular joint is congruent. Soft tissues are unremarkable. IMPRESSION: Remote right humerus fracture with stable posttraumatic deformity. No acute fracture of the shoulder. Electronically Signed   By: Narda RutherfordMelanie  Sanford M.D.   On: 05/22/2020 01:14   CT Head Wo Contrast  Result Date: 05/22/2020 CLINICAL DATA:  Fall EXAM: CT HEAD WITHOUT CONTRAST CT CERVICAL SPINE WITHOUT CONTRAST TECHNIQUE: Multidetector CT imaging of the head and cervical spine was performed following the standard protocol without intravenous contrast. Multiplanar CT image reconstructions of the cervical spine were also generated. COMPARISON:  03/04/2020  FINDINGS: CT HEAD FINDINGS Brain: There is no mass, hemorrhage or extra-axial collection. There is generalized atrophy without lobar predilection. The brain parenchyma is normal, without evidence of acute or chronic infarction. Vascular: No abnormal hyperdensity of the major intracranial arteries or dural venous sinuses. No intracranial atherosclerosis. Skull: The visualized skull base, calvarium and extracranial soft tissues are normal. Sinuses/Orbits: No fluid levels  or advanced mucosal thickening of the visualized paranasal sinuses. No mastoid or middle ear effusion. The orbits are normal. CT CERVICAL SPINE FINDINGS Alignment: No static subluxation. Facets are aligned. Occipital condyles are normally positioned. Skull base and vertebrae: No acute fracture. Soft tissues and spinal canal: No prevertebral fluid or swelling. No visible canal hematoma. Disc levels: Multilevel degenerative disc disease. No bony spinal canal stenosis. Foraminal stenosis is worst on the right at C4-5 and C6-7. Upper chest: No pneumothorax, pulmonary nodule or pleural effusion. Other: Normal visualized paraspinal cervical soft tissues. IMPRESSION: 1. Generalized atrophy without acute intracranial abnormality. 2. No acute fracture or static subluxation of the cervical spine. Electronically Signed   By: Ulyses Jarred M.D.   On: 05/22/2020 01:35   CT Cervical Spine Wo Contrast  Result Date: 05/22/2020 CLINICAL DATA:  Fall EXAM: CT HEAD WITHOUT CONTRAST CT CERVICAL SPINE WITHOUT CONTRAST TECHNIQUE: Multidetector CT imaging of the head and cervical spine was performed following the standard protocol without intravenous contrast. Multiplanar CT image reconstructions of the cervical spine were also generated. COMPARISON:  03/04/2020 FINDINGS: CT HEAD FINDINGS Brain: There is no mass, hemorrhage or extra-axial collection. There is generalized atrophy without lobar predilection. The brain parenchyma is normal, without evidence of acute or  chronic infarction. Vascular: No abnormal hyperdensity of the major intracranial arteries or dural venous sinuses. No intracranial atherosclerosis. Skull: The visualized skull base, calvarium and extracranial soft tissues are normal. Sinuses/Orbits: No fluid levels or advanced mucosal thickening of the visualized paranasal sinuses. No mastoid or middle ear effusion. The orbits are normal. CT CERVICAL SPINE FINDINGS Alignment: No static subluxation. Facets are aligned. Occipital condyles are normally positioned. Skull base and vertebrae: No acute fracture. Soft tissues and spinal canal: No prevertebral fluid or swelling. No visible canal hematoma. Disc levels: Multilevel degenerative disc disease. No bony spinal canal stenosis. Foraminal stenosis is worst on the right at C4-5 and C6-7. Upper chest: No pneumothorax, pulmonary nodule or pleural effusion. Other: Normal visualized paraspinal cervical soft tissues. IMPRESSION: 1. Generalized atrophy without acute intracranial abnormality. 2. No acute fracture or static subluxation of the cervical spine. Electronically Signed   By: Ulyses Jarred M.D.   On: 05/22/2020 01:35   CT SHOULDER RIGHT WO CONTRAST  Result Date: 05/22/2020 CLINICAL DATA:  Unwitnessed fall from standing. Deformity of the right shoulder. EXAM: CT OF THE UPPER RIGHT EXTREMITY WITHOUT CONTRAST TECHNIQUE: Multidetector CT imaging of the upper right extremity was performed according to the standard protocol. COMPARISON:  Radiographs earlier this day. Radiograph 12/05/2019, additional prior radiographs. Prior CT 05/16/2018 FINDINGS: Bones/Joint/Cartilage No acute fracture. Prior proximal right humerus fracture with posttraumatic deformity, chronic nonunion. This is unchanged. There is glenohumeral osteoarthritis with joint space narrowing and probable intra-articular bodies anteriorly in the axillary recess. The right clavicle is intact without acute fracture. No scapular fracture. Ligaments  Suboptimally assessed by CT. Muscles and Tendons Supraspinatus and subscapularis fatty muscle atrophy. No intramuscular hematoma. Soft tissues No confluent soft tissue hematoma. Remote fractures of right ribs with callus formation. No pneumothorax. IMPRESSION: 1. No acute fracture or subluxation of the right shoulder. 2. Prior proximal right humerus fracture with posttraumatic deformity, chronic nonunion. 3. Right glenohumeral osteoarthritis with probable intra-articular bodies anteriorly in the axillary recess. Electronically Signed   By: Keith Rake M.D.   On: 05/22/2020 01:30    Procedures .Marland KitchenLaceration Repair  Date/Time: 05/22/2020 3:07 AM Performed by: Veatrice Kells, MD Authorized by: Veatrice Kells, MD   Consent:    Consent obtained:  Emergent  situation   Alternatives discussed:  No treatment Anesthesia (see MAR for exact dosages):    Anesthesia method:  Local infiltration   Local anesthetic:  Lidocaine 1% WITH epi Laceration details:    Location:  Face   Facial location: just below left eyebrow.   Length (cm):  2   Depth (mm):  1 Repair type:    Repair type:  Simple Pre-procedure details:    Preparation:  Patient was prepped and draped in usual sterile fashion Exploration:    Hemostasis achieved with:  Cautery   Wound exploration: wound explored through full range of motion     Wound extent: no areolar tissue violation noted     Contaminated: no   Treatment:    Wound cleansed with: chlorhexidine    Amount of cleaning:  Extensive Skin repair:    Repair method:  Tissue adhesive Approximation:    Approximation:  Close Post-procedure details:    Dressing:  Open (no dressing)   Patient tolerance of procedure:  Tolerated well, no immediate complications   (including critical care time)  Medications Ordered in ED Medications  lidocaine-EPINEPHrine (XYLOCAINE W/EPI) 2 %-1:100000 (with pres) injection 20 mL (has no administration in time range)    lidocaine-EPINEPHrine-tetracaine (LET) topical gel (3 mLs Topical Given 05/22/20 0230)    ED Course  I have reviewed the triage vital signs and the nursing notes.  Pertinent labs & imaging results that were available during my care of the patient were reviewed by me and considered in my medical decision making (see chart for details).     Final Clinical Impression(s) / ED Diagnoses Return for intractable cough, coughing up blood,fevers >100.4 unrelieved by medication, shortness of breath, intractable vomiting, chest pain, shortness of breath, weakness,numbness, changes in speech, facial asymmetry,abdominal pain, passing out,Inability to tolerate liquids or food, cough, altered mental status or any concerns. No signs of systemic illness or infection. The patient is nontoxic-appearing on exam and vital signs are within normal limits.   I have reviewed the triage vital signs and the nursing notes. Pertinent labs &imaging results that were available during my care of the patient were reviewed by me and considered in my medical decision making (see chart for details).After history, exam, and medical workup I feel the patient has beenappropriately medically screened and is safe for discharge home. Pertinent diagnoses were discussed with the patient. Patient was given return precautions.      Taras Rask, MD 05/22/20 [id number]

## 2020-05-22 NOTE — ED Triage Notes (Signed)
Patient in by [facility] from [facility] after suffering a fall from standing unwitnessed, suffered a laceration on left brow with hematoma, deformity to right clavicle and upper humerus.

## 2020-05-22 NOTE — ED Notes (Signed)
[name] with PT Rehab at [facility] called with questions regarding discharge instructions. Opened chart to answer questions.

## 2020-05-29 ENCOUNTER — Other Ambulatory Visit: Payer: Self-pay

## 2020-05-29 ENCOUNTER — Ambulatory Visit (INDEPENDENT_AMBULATORY_CARE_PROVIDER_SITE_OTHER): Payer: Medicare Other | Admitting: Family Medicine

## 2020-05-29 ENCOUNTER — Encounter: Payer: Self-pay | Admitting: Family Medicine

## 2020-05-29 DIAGNOSIS — M79601 Pain in right arm: Secondary | ICD-10-CM

## 2020-05-29 MED ORDER — DICLOFENAC SODIUM 1 % EX GEL: 4.0000 g | Freq: Four times a day (QID) | CUTANEOUS | 6 refills | Status: DC | PRN

## 2020-05-29 NOTE — Patient Instructions (Signed)
   Right arm pain:  - Try Voltaren Gel 4 times daily as needed   - Can try physical therapy at [facility]  Shoulder fracture is healed.  No need for a sling for the shoulder fracture.

## 2020-05-29 NOTE — Progress Notes (Signed)
Office Visit Note   Patient: [name]           Date of Birth: 26-Dec-1966           MRN: [id number] Visit Date: 05/29/2020 Requested by: Wenda Low, MD [address] [place],  [place] [id number] PCP: Wenda Low, MD  Subjective: Chief Complaint  Patient presents with  . Right Shoulder - Fracture    S/p fall 05/22/20 - no acute fracture per CT, but has a nonunion proximal humerus fracture. Currently not wearing sling, but he does have one. Lives at [facility].    HPI: He is here with right arm pain.  He is present with a caregiver today.  He has had multiple falls documented in his chart.  He has an anoxic brain injury, dementia, quadriplegia.  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  At his most recent fall, x-rays showed an old healed comminuted proximal humerus fracture.  CT scan was obtained as well.  He is here for evaluation of that.  Even in December 2020, had x-rays of his right shoulder documented this old fracture.  Patient does not communicate verbally, but when I asked him where he hurts, he points to the right forearm and elbow area.  He does not feed himself, but he is able to hold a cup and drink from it.  He does not do much else with his arm according to the caretaker.               ROS:   All other systems were reviewed and are negative.  Objective: Vital Signs: There were no vitals taken for this visit.  Physical Exam:  General:  Alert and oriented, in no acute distress. Pulm:  Breathing unlabored. Psy:  Normal mood, congruent affect. Skin: There is no bruising or swelling today. Right arm: He has limited active use of his right shoulder.  He is able to fully extend his elbow and flex it to about 75 degrees.  He is able to open and close his fingers and flex and extend his fist.  He does not seem to have pain when I palpate his elbow, forearm, and hand.  Imaging: No results found.  Assessment & Plan: 1.  Right arm  pain, etiology uncertain.  He has an old healed proximal humerus fracture that does not seem to be causing pain. -Trial of Voltaren gel.  Could add physical therapy at his residence.  Follow-up as needed.     Procedures: No procedures performed  No notes on file     PMFS History: Patient Active Problem List   Diagnosis Date Noted  . PEG Gastrostomy tube in place ([place]) 01/26/2018  . 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 01/26/2018 01/26/2018  . Bedridden 01/26/2018  . Quadriplegia ([place])   . Abdominal wall abscess 01/25/2018  . Fracture of humerus anatomical neck, right, closed, initial encounter 10/03/2017  . AKI (acute kidney injury) ([place]) 10/03/2017  . UTI (urinary tract infection) 10/03/2017  . Agitation 07/03/2014  . Cardiomyopathy, ischemic 06/18/2014  . 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 Acute psychosis ([place]) 04/19/2014  . Unspecified hypothyroidism 05/16/2013  . Constipation 05/16/2013  . Anemia of other chronic disease 05/16/2013  . Mental disorder 01/03/2013  . Addisons disease ([place]) 01/03/2013  . PEG (percutaneous endoscopic gastrostomy) adjustment/replacement/removal ([place]) 12/21/2011  . Hypokalemia 11/25/2011  . Thrombocytopenia ([place]) 11/25/2011  . FTT (failure to thrive) in adult 11/25/2011  . Grand mal  seizure (HCC) 11/23/2011  . Depression (emotion) 11/23/2011  . History of atrial fibrillation 11/23/2011  . Other specified hemorrhagic conditions (HCC) 11/23/2011  . Glaucoma 11/23/2011  . Hypoglycemia 11/23/2011  . Chronic constipation 11/23/2011  . Anoxic brain injury (HCC) 11/12/2011  . Protein calorie malnutrition (HCC) 11/12/2011  . Hyperchloremia 11/12/2011  . Hyperthyroidism 11/12/2011  . Acute lower UTI 11/12/2011  . Dysphagia    Past Medical History:  Diagnosis Date  . Addison disease (HCC)   . Anoxic brain injury (HCC)   . Anxiety   . Aspiration pneumonia (HCC)   . Atrial fibrillation (HCC)   . Chronic constipation  11/23/2011  . Dementia (HCC)   . Dysphagia   . Encephalopathy   . Glaucoma   . Glucocorticoid deficiency (HCC)   . History of recurrent UTIs   . Hyperlipemia   . Hypothyroidism   . Mental disorder   . Myocardial infarction (HCC)   . Pneumoperitoneum of unknown etiology 12/21/2011  . Quadriplegia (HCC)   . Reflux   . Seizure disorder [facility])     History reviewed. No pertinent family history.  Past Surgical History:  Procedure Laterality Date  . DEBRIDEMENT OF ABDOMINAL WALL ABSCESS  01/26/2018  . HERNIA MESH REMOVAL  01/26/2018   Infected mesh - removed w I&D abd wall abscess  . IR GENERIC HISTORICAL  10/20/2016   IR REPLC GASTRO/COLONIC TUBE PERCUT W/FLUORO 10/20/2016 Oley Balm, MD [facility]-INTER[facility]  . IR REPLACE G-TUBE SIMPLE WO FLUORO  01/28/2018  . IR REPLC GASTRO/COLONIC TUBE PERCUT W/FLUORO  04/21/2017  . IR REPLC GASTRO/COLONIC TUBE PERCUT W/FLUORO  07/22/2017  . IR REPLC GASTRO/COLONIC TUBE PERCUT W/FLUORO  11/24/2017  . IRRIGATION AND DEBRIDEMENT ABSCESS N/A 01/26/2018   Procedure: IRRIGATION AND DEBRIDEMENT ABDOMINAL WALL ABSCESS WITH REMOVAL OF INFECTED MESH;  Surgeon: Karie Soda, MD;  Location: [facility] ORS;  Service: General;  Laterality: N/A;  . NEPHRECTOMY  unknown  . PEG TUBE PLACEMENT     Social History   Occupational History  . Not on file  Tobacco Use  . Smoking status: Never Smoker  . Smokeless tobacco: Never Used  Substance and Sexual Activity  . Alcohol use: No  . Drug use: No  . Sexual activity: Never

## 2020-06-05 ENCOUNTER — Encounter: Payer: Self-pay | Admitting: *Deleted

## 2020-06-07 ENCOUNTER — Ambulatory Visit (INDEPENDENT_AMBULATORY_CARE_PROVIDER_SITE_OTHER): Payer: Medicare Other | Admitting: Diagnostic Neuroimaging

## 2020-06-07 ENCOUNTER — Encounter: Payer: Self-pay | Admitting: Diagnostic Neuroimaging

## 2020-06-07 ENCOUNTER — Other Ambulatory Visit: Payer: Self-pay

## 2020-06-07 VITALS — BP 99/76 | HR 84

## 2020-06-07 DIAGNOSIS — G931 Anoxic brain damage, not elsewhere classified: Secondary | ICD-10-CM | POA: Diagnosis not present

## 2020-06-07 DIAGNOSIS — G8250 Quadriplegia, unspecified: Secondary | ICD-10-CM
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
## 2020-06-10 ENCOUNTER — Ambulatory Visit (INDEPENDENT_AMBULATORY_CARE_PROVIDER_SITE_OTHER): Payer: Medicare Other | Admitting: Family Medicine

## 2020-06-10 ENCOUNTER — Other Ambulatory Visit: Payer: Self-pay

## 2020-06-10 ENCOUNTER — Encounter: Payer: Self-pay | Admitting: Family Medicine

## 2020-06-10 ENCOUNTER — Ambulatory Visit: Payer: Self-pay

## 2020-06-10 DIAGNOSIS — M79601 Pain in right arm: Secondary | ICD-10-CM | POA: Diagnosis not present

## 2020-06-10 NOTE — Progress Notes (Signed)
Office Visit Note   Patient: [name]           Date of Birth: 1966-08-05           MRN: [id number] Visit Date: 06/10/2020 Requested by: Georgann Housekeeper, MD [address] [place],  [place] [address] PCP: Georgann Housekeeper, MD  Subjective: Chief Complaint  Patient presents with  . Right Shoulder - Pain    Pt comes in today for right shoulder pain. Comes in with care giver from [facility]. Had X-rays/CT Scan done 04/2020. +Tylenol (PRN)     HPI: He is here with ongoing right arm pain.  No falls since last visit.  He was to follow-up as needed, but is brought here again because of pain.  History is unclear, patient is not communicative and there are no notes explaining why he is here today.               ROS:   All other systems were reviewed and are negative.  Objective: Vital Signs: There were no vitals taken for this visit.  Physical Exam:  General:  Alert and oriented, in no acute distress. Pulm:  Breathing unlabored. Psy:  Normal mood, congruent affect. Skin: There is bruising and swelling of his upper arm in the biceps area. Right arm: He does not actively use his arm.  I am able to gently move his shoulder and his elbow and I can pronate and supinate his forearm without obvious exacerbation and pain.  Imaging: XR Humerus Right  Result Date: 06/10/2020 X-rays right humerus reveal no new fractures.  The old fracture with displacement and angulation appears unchanged compared to previous x-rays.   Assessment & Plan: 1.  Persistent right arm pain with swelling and bruising, etiology uncertain. -We will order Doppler studies to rule out DVT.  If negative, then physical therapy modalities.     Procedures: No procedures performed  No notes on file     PMFS History: Patient Active Problem List   Diagnosis Date Noted  . PEG Gastrostomy tube in place (HCC) 01/26/2018  . 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 01/26/2018 01/26/2018  .  Bedridden 01/26/2018  . Quadriplegia (HCC)   . Abdominal wall abscess 01/25/2018  . Fracture of humerus anatomical neck, right, closed, initial encounter 10/03/2017  . AKI (acute kidney injury) (HCC) 10/03/2017  . UTI (urinary tract infection) 10/03/2017  . Agitation 07/03/2014  . Cardiomyopathy, ischemic 06/18/2014  . 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 Acute psychosis (HCC) 04/19/2014  . Unspecified hypothyroidism 05/16/2013  . Constipation 05/16/2013  . Anemia of other chronic disease 05/16/2013  . Mental disorder 01/03/2013  . Addisons disease (HCC) 01/03/2013  . PEG (percutaneous endoscopic gastrostomy) adjustment/replacement/removal (HCC) 12/21/2011  . Hypokalemia 11/25/2011  . Thrombocytopenia (HCC) 11/25/2011  . FTT (failure to thrive) in adult 11/25/2011  . Grand mal seizure (HCC) 11/23/2011  . Depression (emotion) 11/23/2011  . History of atrial fibrillation 11/23/2011  . Other specified hemorrhagic conditions (HCC) 11/23/2011  . Glaucoma 11/23/2011  . Hypoglycemia 11/23/2011  . Chronic constipation 11/23/2011  . Anoxic brain injury (HCC) 11/12/2011  . Protein calorie malnutrition (HCC) 11/12/2011  . Hyperchloremia 11/12/2011  . Hyperthyroidism 11/12/2011  . Acute lower UTI 11/12/2011  . Dysphagia    Past Medical History:  Diagnosis Date  . Addison disease (HCC)   . Anoxic brain injury (HCC)    TBI  . Anxiety   . Aspiration pneumonia (HCC)   . Atrial fibrillation (HCC)   .  Chronic constipation 11/23/2011  . Dementia ([place])   . Dysphagia   . Encephalopathy   . Frontal lobe syndrome   . Gait abnormality    freq falls  . Glaucoma   . Glucocorticoid deficiency ([place])   . History of heart attack   . History of recurrent UTIs   . Hyperlipemia   . Hypothyroidism   . Mental disorder   . Myocardial infarction ([place])   . Pneumoperitoneum of unknown etiology 12/21/2011  . Quadriplegia ([place])   . Reflux   . Seizure disorder ([place])   . Weakness of both legs       History reviewed. No pertinent family history.  Past Surgical History:  Procedure Laterality Date  . DEBRIDEMENT OF ABDOMINAL WALL ABSCESS  01/26/2018  . HERNIA MESH REMOVAL  01/26/2018   Infected mesh - removed w I&D abd wall abscess  . IR GENERIC HISTORICAL  10/20/2016   IR [place] TUBE PERCUT W/FLUORO 10/20/2016 Arne Cleveland, MD [facility]-INTER[facility]  . IR REPLACE G-TUBE SIMPLE WO FLUORO  01/28/2018  . IR [place] TUBE PERCUT W/FLUORO  04/21/2017  . IR REPLC GASTRO/COLONIC TUBE PERCUT W/FLUORO  07/22/2017  . IR [place] PERCUT W/FLUORO  11/24/2017  . IRRIGATION AND DEBRIDEMENT ABSCESS N/A 01/26/2018   Procedure: IRRIGATION AND DEBRIDEMENT ABDOMINAL WALL ABSCESS WITH REMOVAL OF INFECTED MESH;  Surgeon:  Boston, MD;  Location: [facility];  Service: General;  Laterality: N/A;  . NEPHRECTOMY  unknown  . PEG TUBE PLACEMENT     Social History   Occupational History  . Not on file  Tobacco Use  . Smoking status: Never Smoker  . Smokeless tobacco: Never Used  Vaping Use  . Vaping Use: Never used  Substance and Sexual Activity  . Alcohol use: No  . Drug use: No  . Sexual activity: Never

## 2020-06-19 ENCOUNTER — Ambulatory Visit (HOSPITAL_COMMUNITY): Payer: Medicare Other

## 2020-06-24 ENCOUNTER — Telehealth: Payer: Self-pay

## 2020-06-24 ENCOUNTER — Ambulatory Visit (HOSPITAL_COMMUNITY)
Admission: RE | Admit: 2020-06-24 | Discharge: 2020-06-24 | Disposition: A | Discharge status: Home / Self Care | Payer: Medicare Other | Source: Ambulatory Visit | Attending: Family Medicine | Admitting: Family Medicine

## 2020-06-24 ENCOUNTER — Telehealth: Payer: Self-pay | Admitting: Family Medicine

## 2020-06-24 ENCOUNTER — Other Ambulatory Visit: Payer: Self-pay

## 2020-06-24 DIAGNOSIS — M79601 Pain in right arm: Secondary | ICD-10-CM | POA: Diagnosis not present

## 2020-06-24 IMAGING — CT CT CERVICAL SPINE W/O CM
3 of 4 series · 13 of 33 positions shown, 16 images · non-contrast
Comparison: 03/04/2020

CLINICAL DATA: Fall

EXAM:
CT HEAD WITHOUT CONTRAST
CT CERVICAL SPINE WITHOUT CONTRAST
TECHNIQUE: Multidetector CT imaging of the head and cervical spine was
performed following the standard protocol without intravenous
contrast. Multiplanar CT image reconstructions of the cervical spine
were also generated.

[Series 6: orthogonal bone · axial · 0.23mm/px · z∈[+1308,+1445]mm · 5 of 111 slices shown, 7 images]
[im 19/111  soft-tissue]
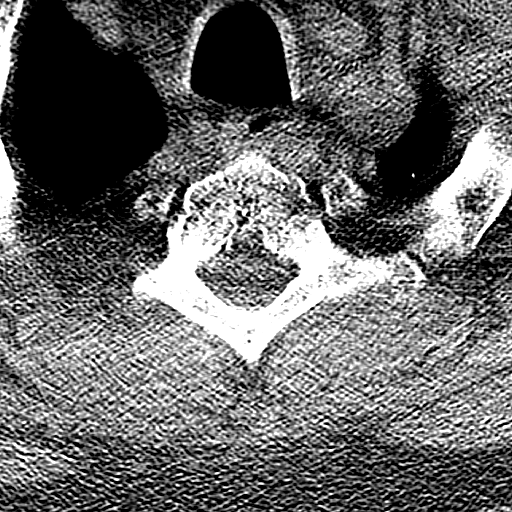
[im 19/111  bone]
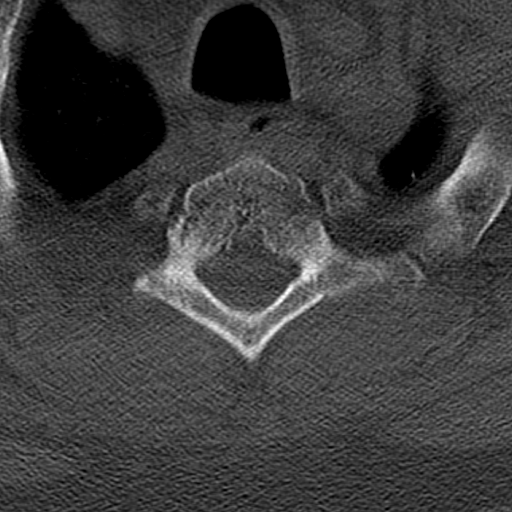
[im 37/111  bone]
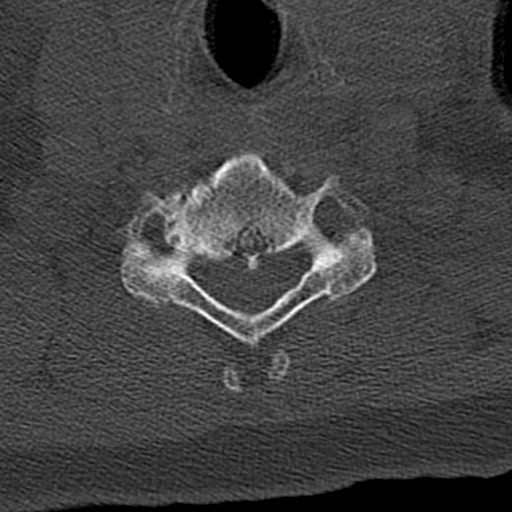
[im 56/111  bone]
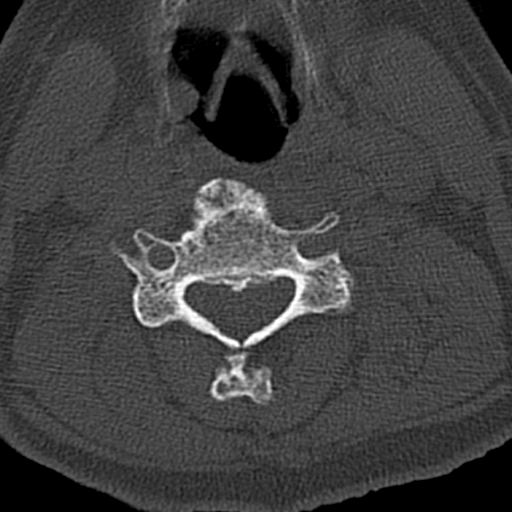
[im 74/111  bone]
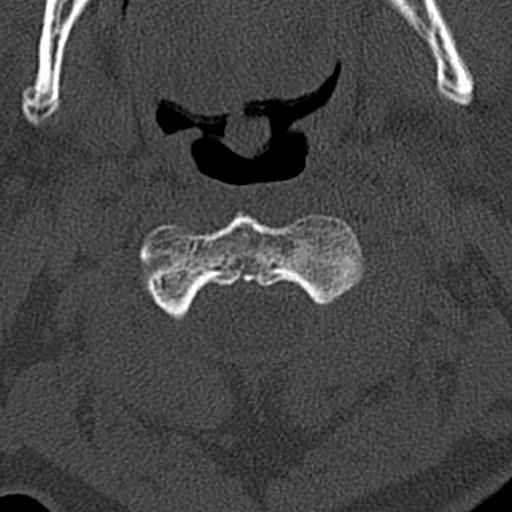
[im 92/111  soft-tissue]
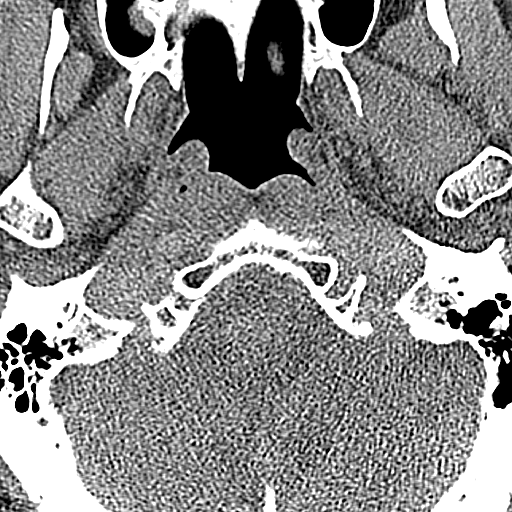
[im 92/111  bone]
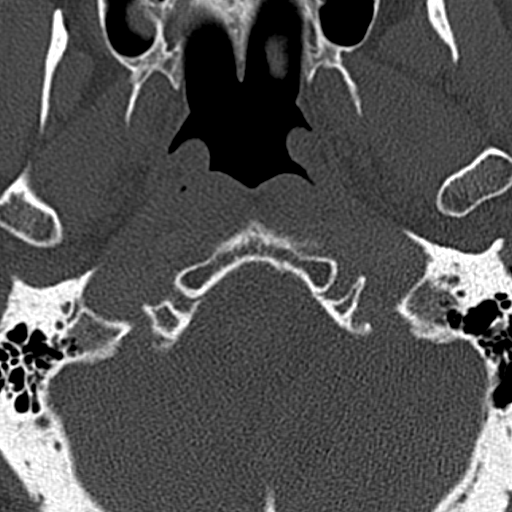

[Series 7: coronal bone · coronal · 0.30mm/px · 3 of 61 slices shown]
[im 13/61  bone]
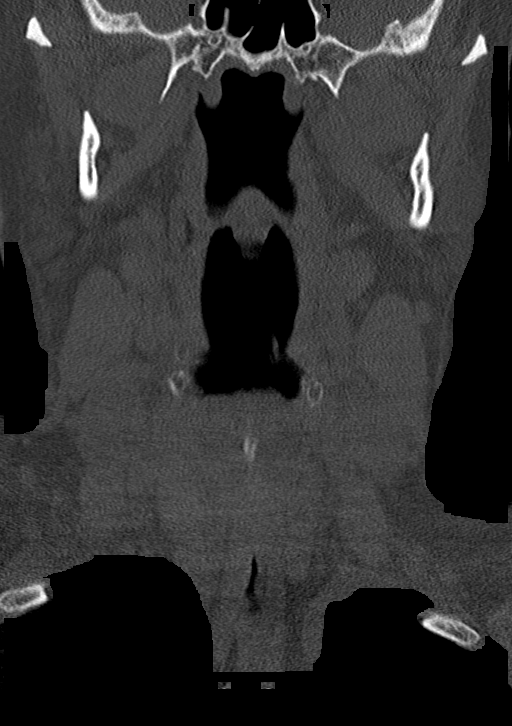
[im 25/61  bone]
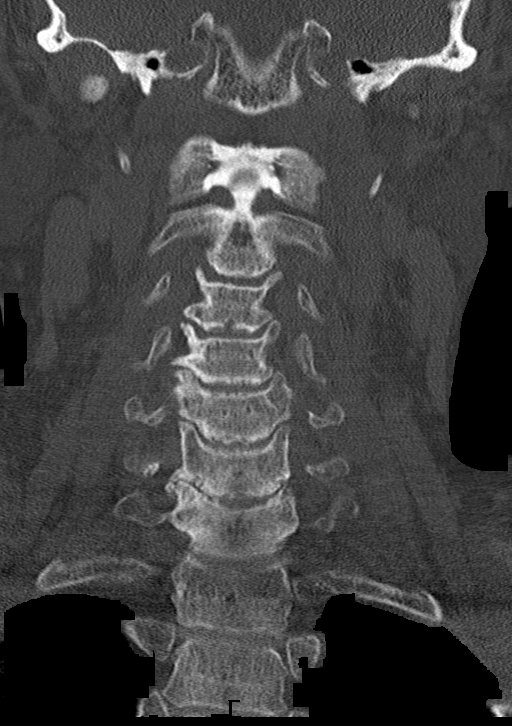
[im 37/61  bone]
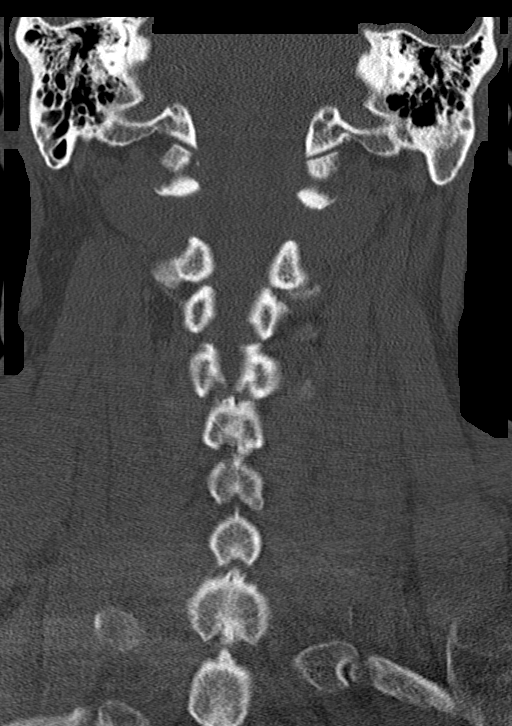

[Series 8: sagittal bone · sagittal · 0.30mm/px · 5 of 58 slices shown, 6 images]
[im 20/58  bone]
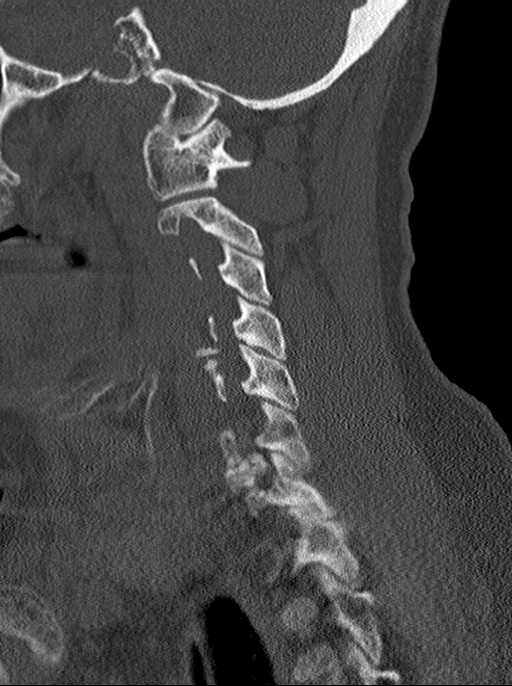
[im 24/58  bone]
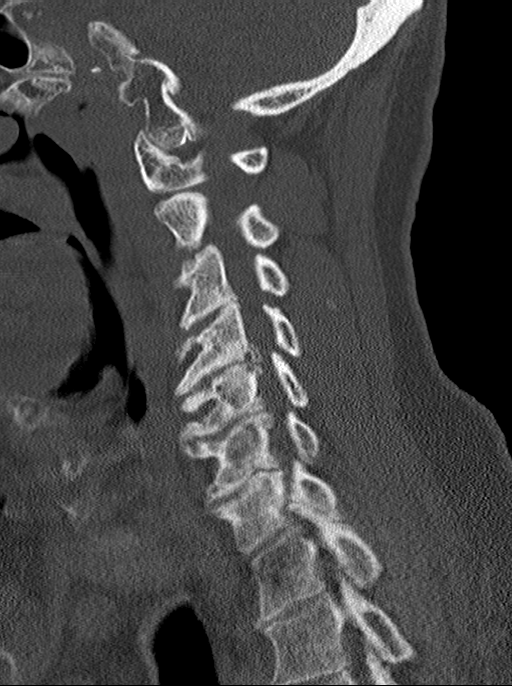
[im 29/58  soft-tissue]
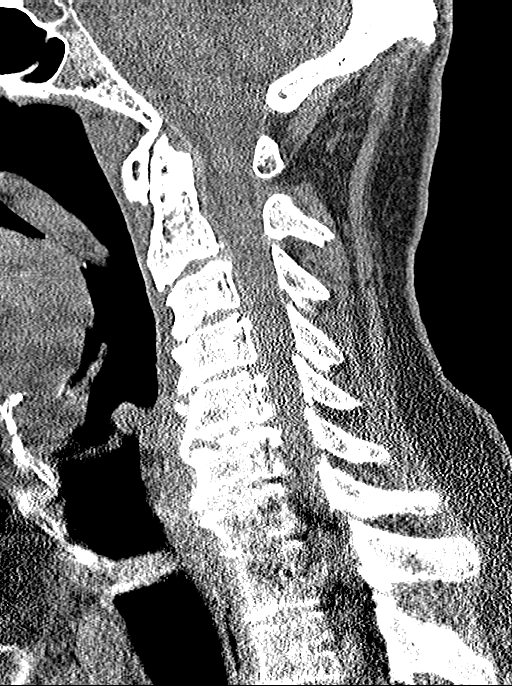
[im 29/58  bone]
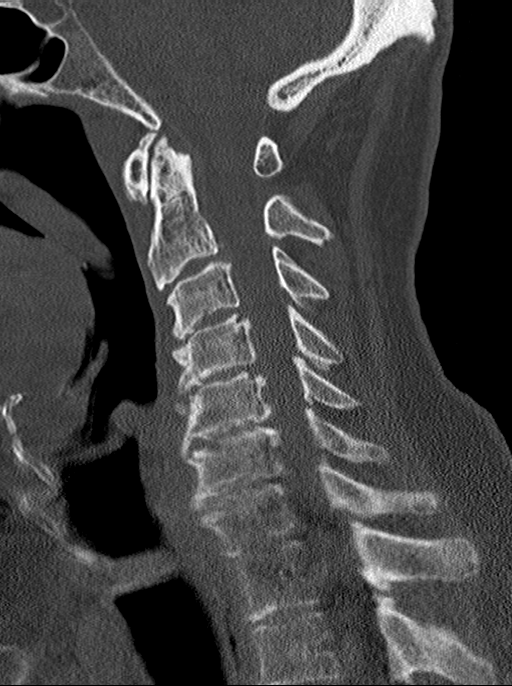
[im 34/58  bone]
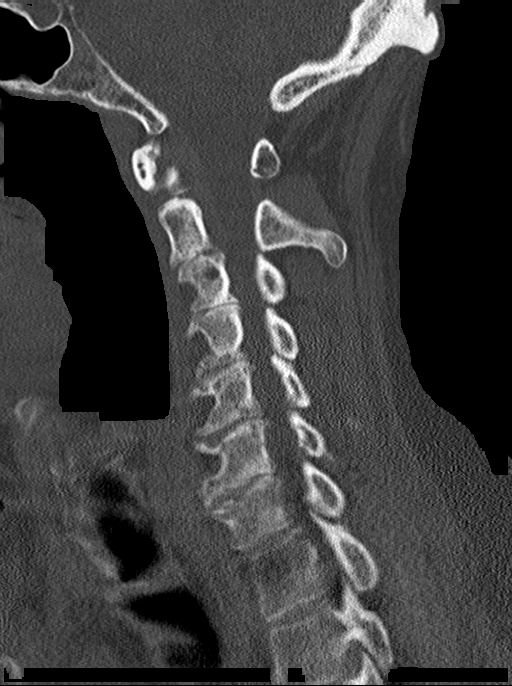
[im 39/58  bone]
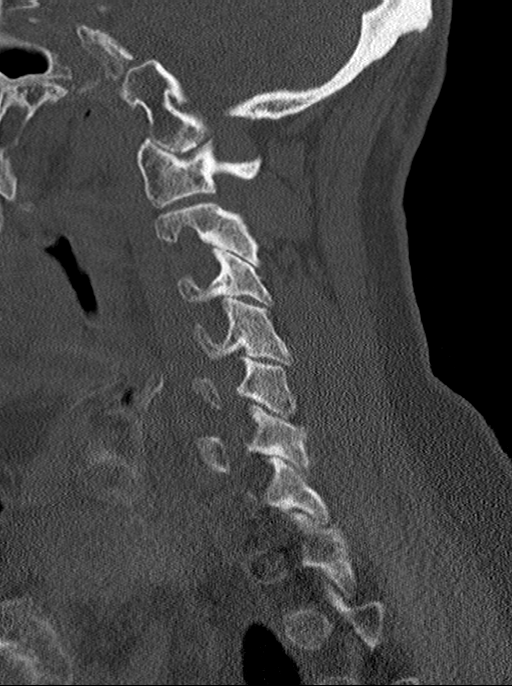

[13 of 33 positions shown; findings below may reference images not displayed]

FINDINGS: CT HEAD FINDINGS

Brain: There is no mass, hemorrhage or extra-axial collection. There
is generalized atrophy without lobar predilection. The brain
parenchyma is normal, without evidence of acute or chronic
infarction.

Vascular: No abnormal hyperdensity of the major intracranial
arteries or dural venous sinuses. No intracranial atherosclerosis.

Skull: The visualized skull base, calvarium and extracranial soft
tissues are normal.

Sinuses/Orbits: No fluid levels or advanced mucosal thickening of
the visualized paranasal sinuses. No mastoid or middle ear effusion.
The orbits are normal.

CT CERVICAL SPINE FINDINGS

Alignment: No static subluxation. Facets are aligned. Occipital
condyles are normally positioned.

Skull base and vertebrae: No acute fracture.

Soft tissues and spinal canal: No prevertebral fluid or swelling. No
visible canal hematoma.

Disc levels: Multilevel degenerative disc disease. No bony spinal
canal stenosis. Foraminal stenosis is worst on the right at C4-5 and
C6-7.

Upper chest: No pneumothorax, pulmonary nodule or pleural effusion.

Other: Normal visualized paraspinal cervical soft tissues.
IMPRESSION: 1. Generalized atrophy without acute intracranial abnormality.
2. No acute fracture or static subluxation of the cervical spine.

## 2020-06-24 IMAGING — CR DG SHOULDER 2+V*R*
2 series · 2 of 2 positions shown · non-contrast
Comparison: Radiograph 12/05/2019

CLINICAL DATA: Unwitnessed fall at [HOSPITAL] with deformity to
right shoulder.

EXAM:
RIGHT SHOULDER - 2+ VIEW

[x shoulder ap right (1 of 2)]
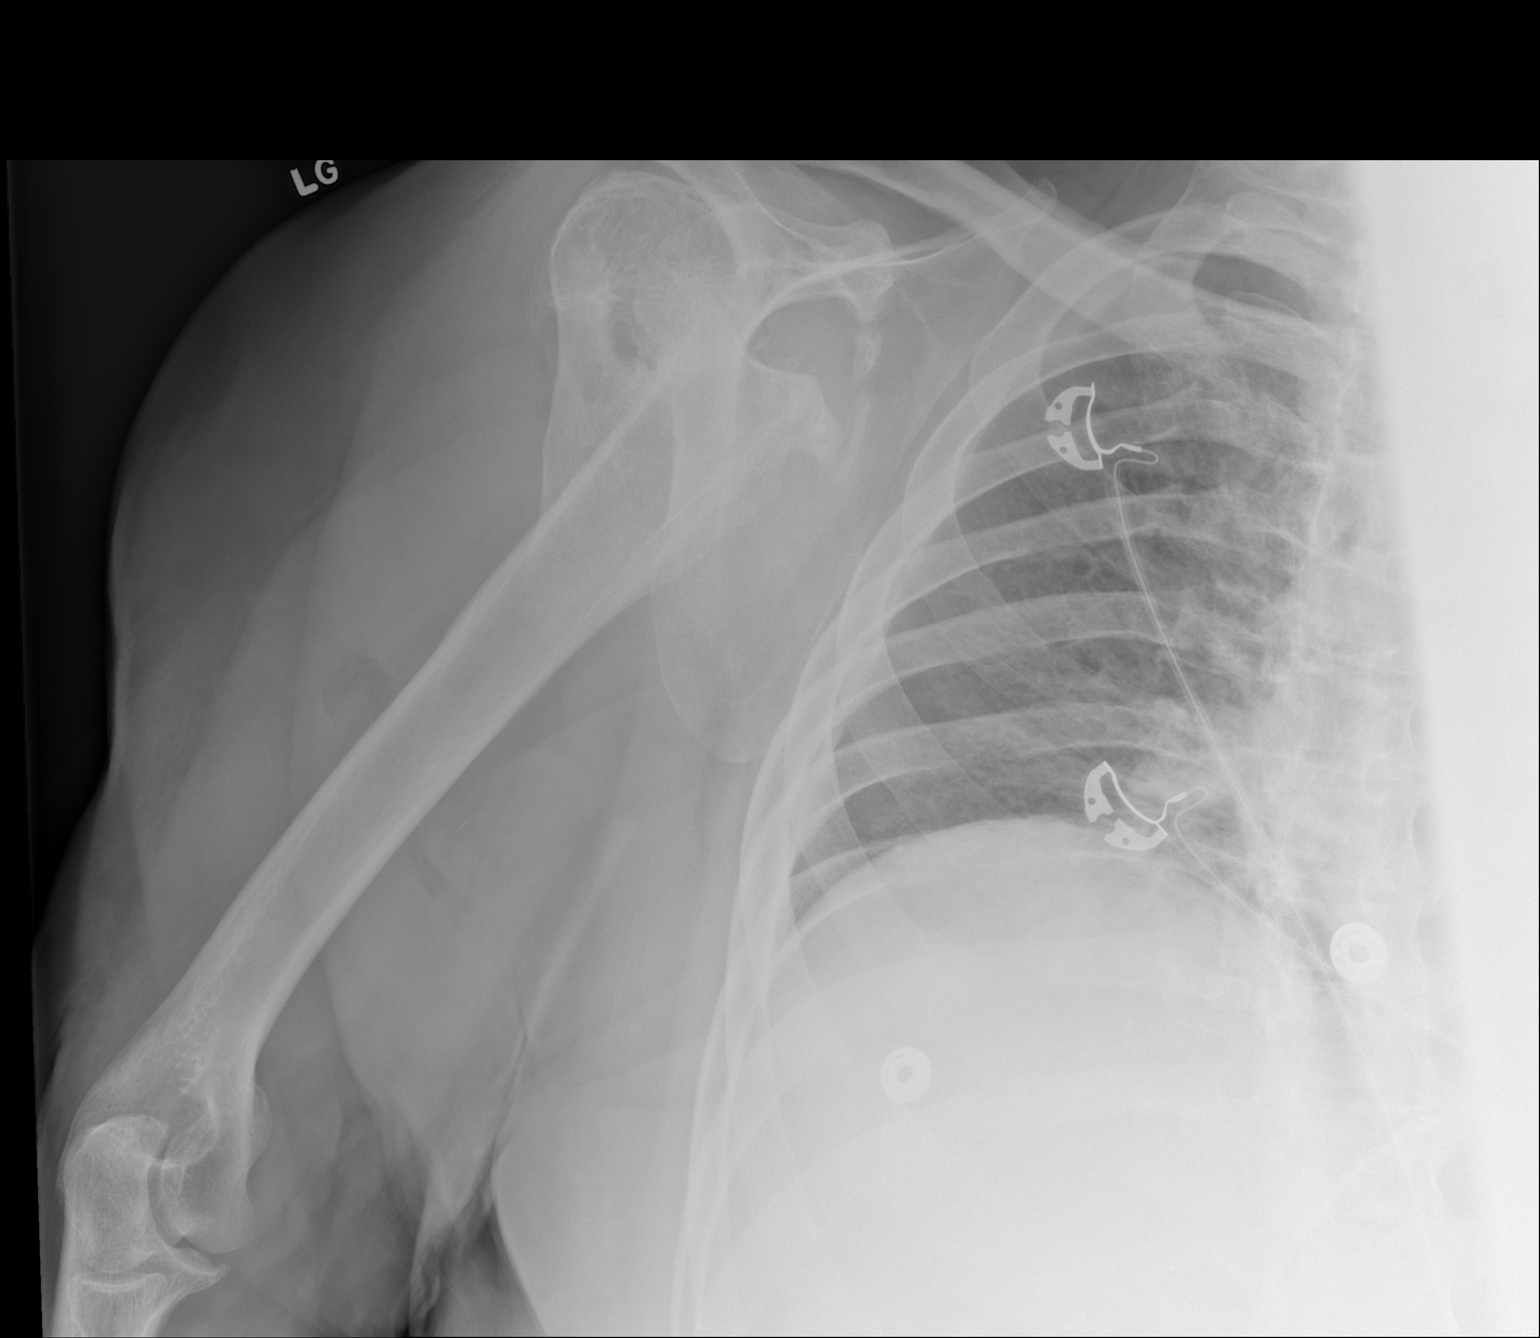

[x shoulder ap right (2 of 2)]
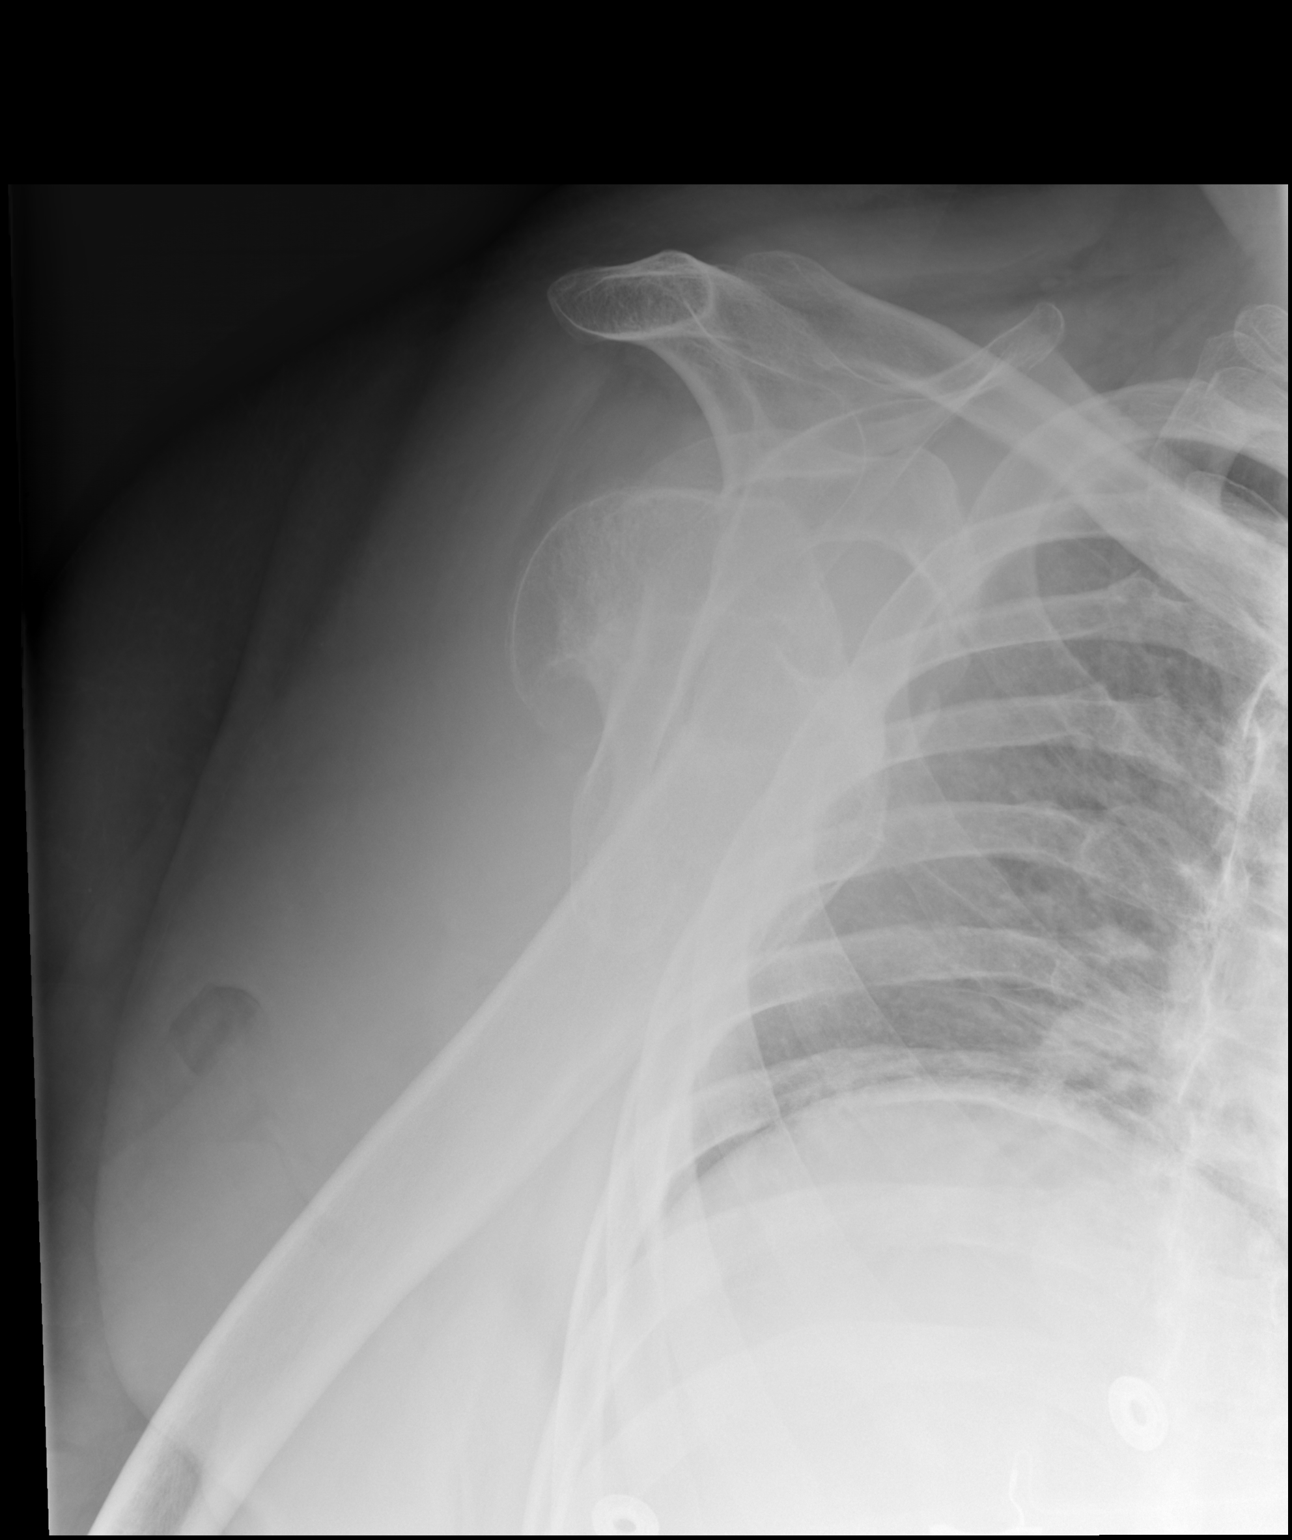

[2 of 2 positions shown; findings below may reference images not displayed]

FINDINGS: No evidence of acute fracture. Stable appearance of remote proximal
humerus fracture with posttraumatic deformity. The acromioclavicular
joint is congruent. Soft tissues are unremarkable.
IMPRESSION: Remote right humerus fracture with stable posttraumatic deformity.
No acute fracture of the shoulder.

## 2020-06-24 IMAGING — CT CT SHOULDER*R* W/O CM
3 of 5 series · 11 of 33 positions shown, 12 images · non-contrast
Comparison: Radiographs earlier this day. Radiograph 12/05/2019,
additional prior radiographs. Prior CT 05/16/2018

CLINICAL DATA: Unwitnessed fall from standing. Deformity of the
right shoulder.

EXAM:
CT OF THE UPPER RIGHT EXTREMITY WITHOUT CONTRAST
TECHNIQUE: Multidetector CT imaging of the upper right extremity was performed
according to the standard protocol.

[Series 8: ax st · axial · 0.45mm/px · z∈[+1173,+1391]mm · 3 of 113 slices shown, 4 images]
[im 1/113  soft-tissue]
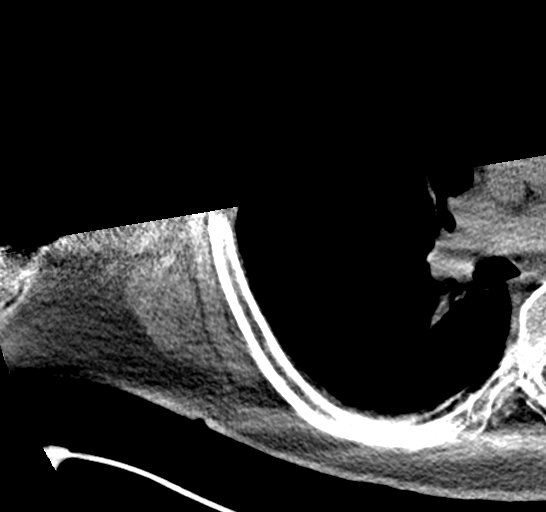
[im 1/113  bone]
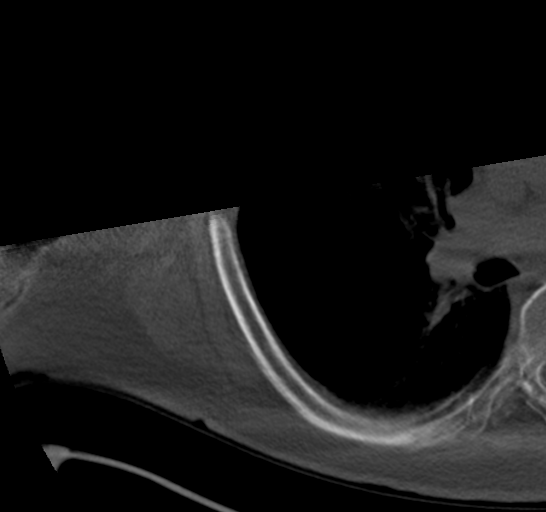
[im 57/113  bone]
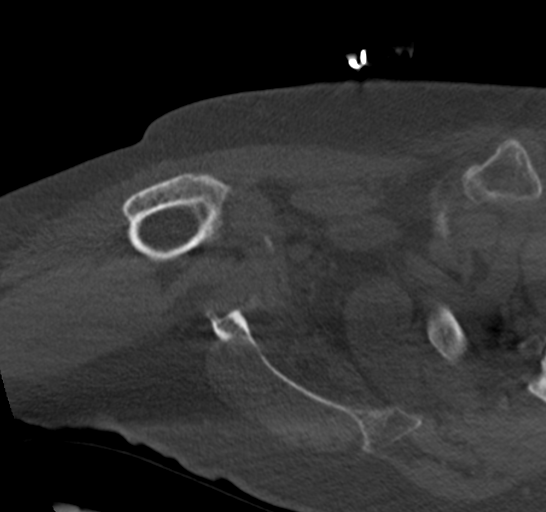
[im 113/113  bone]
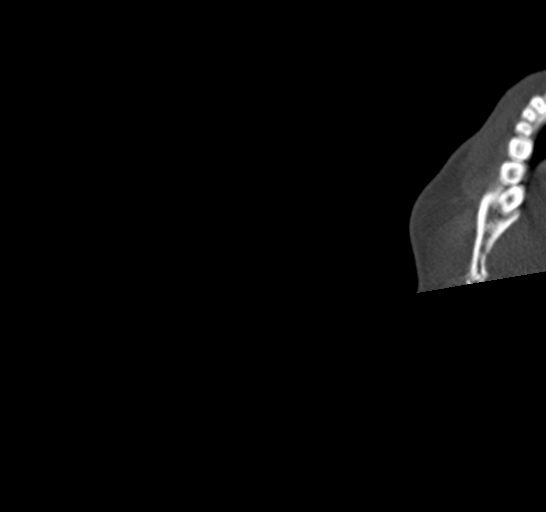

[Series 9: cor st · coronal · 0.44mm/px · 3 of 101 slices shown]
[im 21/101  bone]
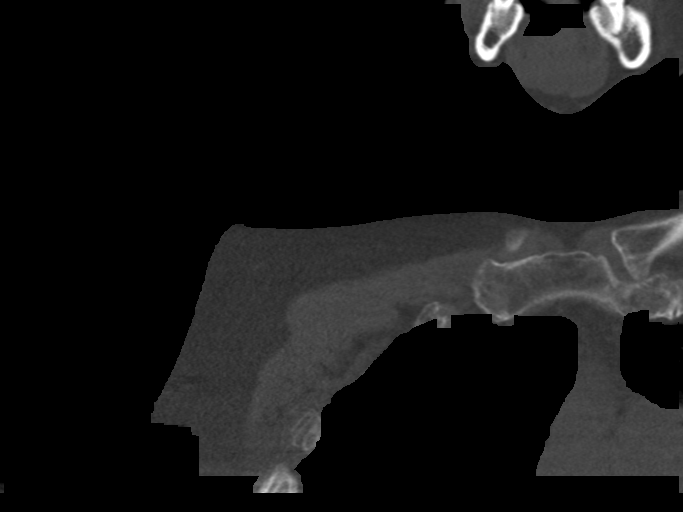
[im 41/101  bone]
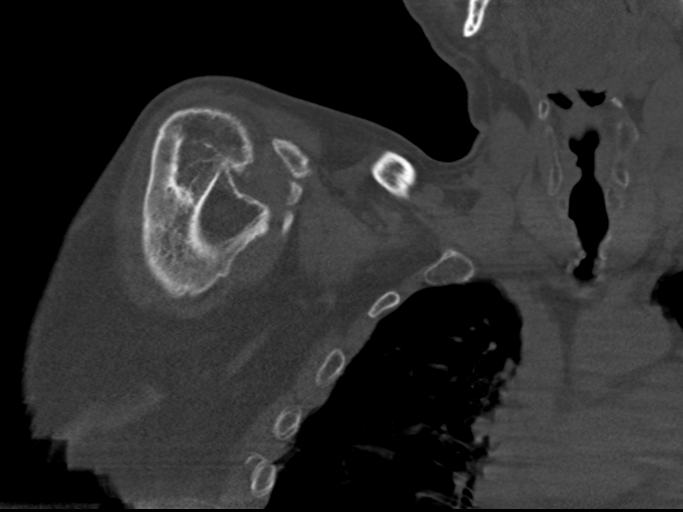
[im 61/101  bone]
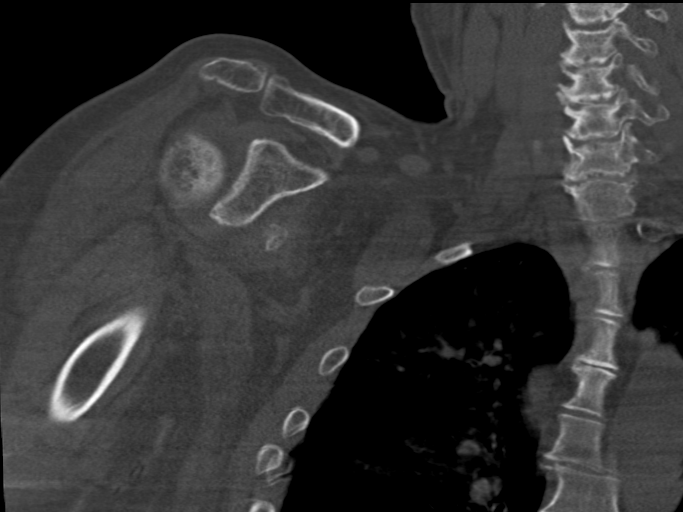

[Series 602: sagittal · sagittal · 0.56mm/px · 5 of 107 slices shown]
[im 18/107  bone]
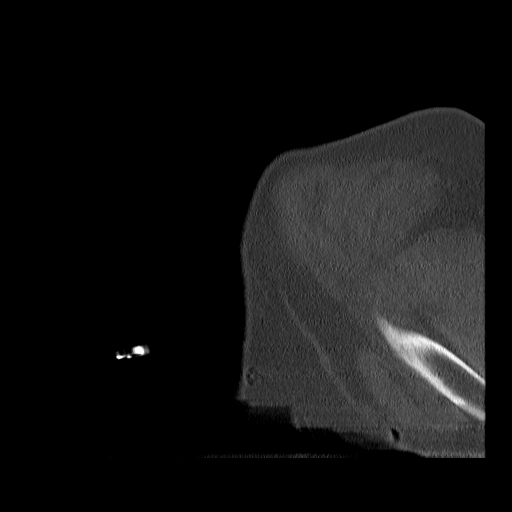
[im 36/107  bone]
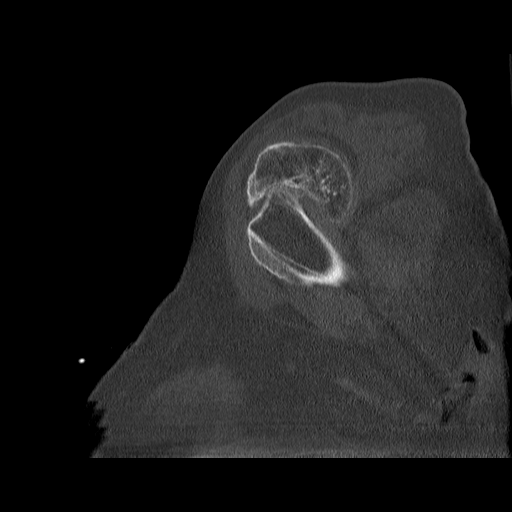
[im 54/107  bone]
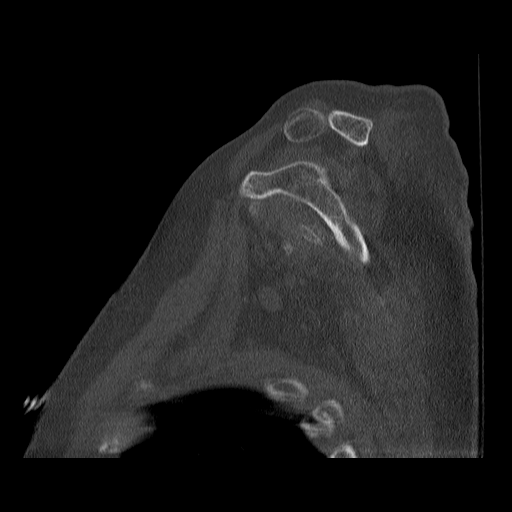
[im 71/107  bone]
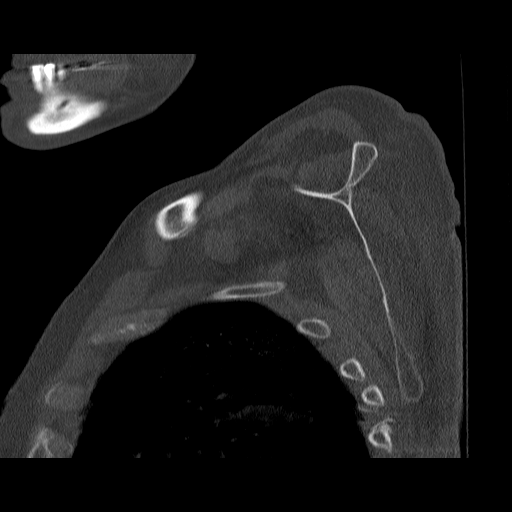
[im 89/107  bone]
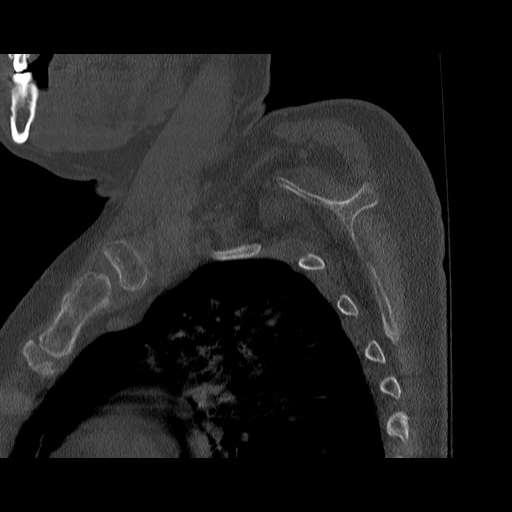

[11 of 33 positions shown; findings below may reference images not displayed]

FINDINGS: Bones/Joint/Cartilage

No acute fracture. Prior proximal right humerus fracture with
posttraumatic deformity, chronic nonunion. This is unchanged. There
is glenohumeral osteoarthritis with joint space narrowing and
probable intra-articular bodies anteriorly in the axillary recess.
The right clavicle is intact without acute fracture. No scapular
fracture.

Ligaments

Suboptimally assessed by CT.

Muscles and Tendons

Supraspinatus and subscapularis fatty muscle atrophy. No
intramuscular hematoma.

Soft tissues

No confluent soft tissue hematoma. Remote fractures of right ribs
with callus formation. No pneumothorax.
IMPRESSION: 1. No acute fracture or subluxation of the right shoulder.
2. Prior proximal right humerus fracture with posttraumatic
deformity, chronic nonunion.
3. Right glenohumeral osteoarthritis with probable intra-articular
bodies anteriorly in the axillary recess.

## 2020-06-24 IMAGING — CT CT HEAD W/O CM
3 series · 15 of 47 positions shown, 18 images · non-contrast
Comparison: 03/04/2020

CLINICAL DATA: Fall

EXAM:
CT HEAD WITHOUT CONTRAST
CT CERVICAL SPINE WITHOUT CONTRAST
TECHNIQUE: Multidetector CT imaging of the head and cervical spine was
performed following the standard protocol without intravenous
contrast. Multiplanar CT image reconstructions of the cervical spine
were also generated.

[Series 3: head wo · axial · 0.51mm/px · z∈[+1486,+1631]mm · 9 of 35 slices shown, 12 images]
[im 3/35  brain]
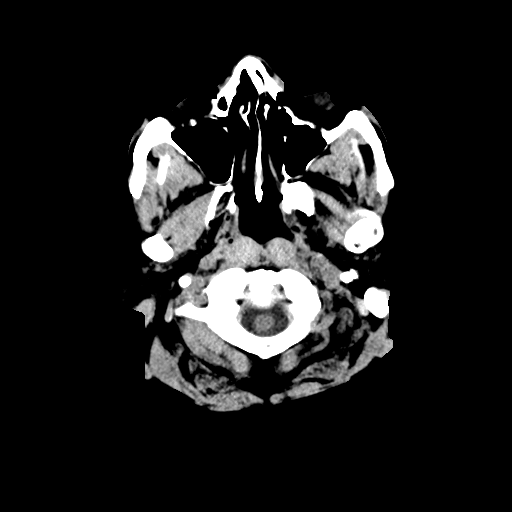
[im 3/35  bone]
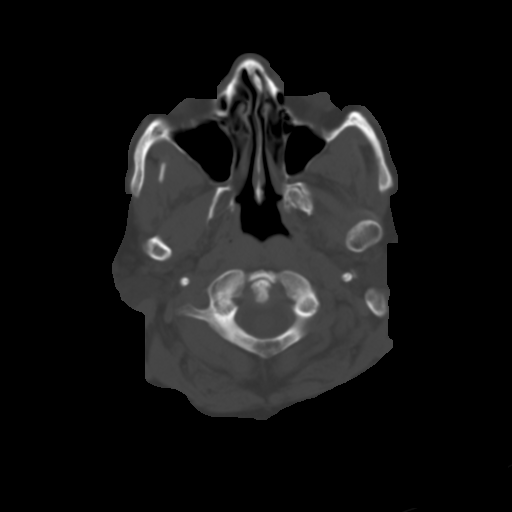
[im 6/35  brain]
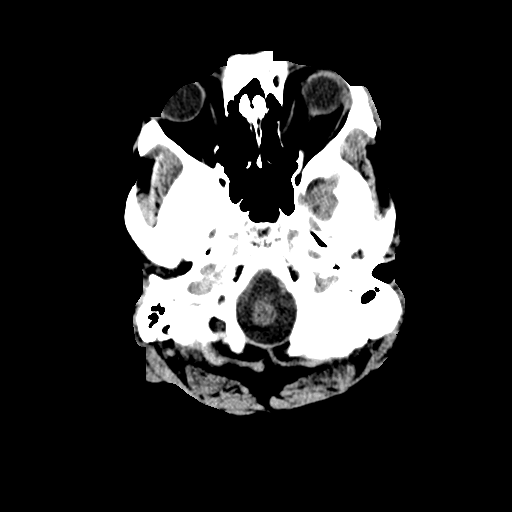
[im 10/35  brain]
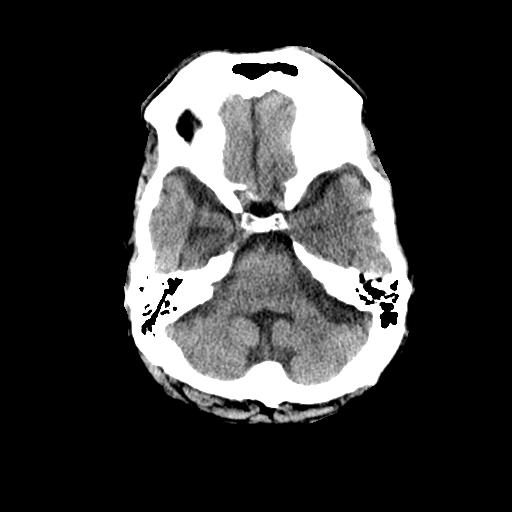
[im 13/35  brain]
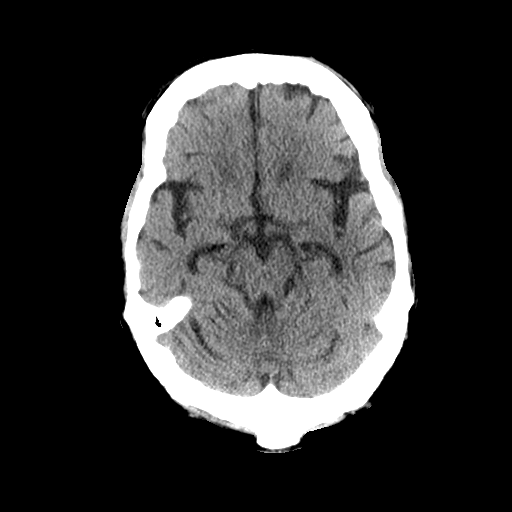
[im 18/35  brain]
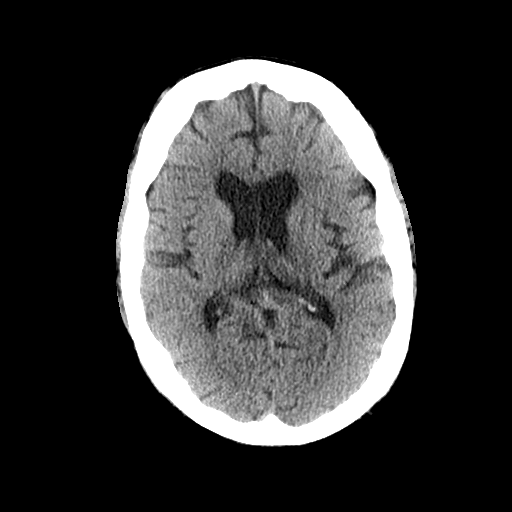
[im 18/35  bone]
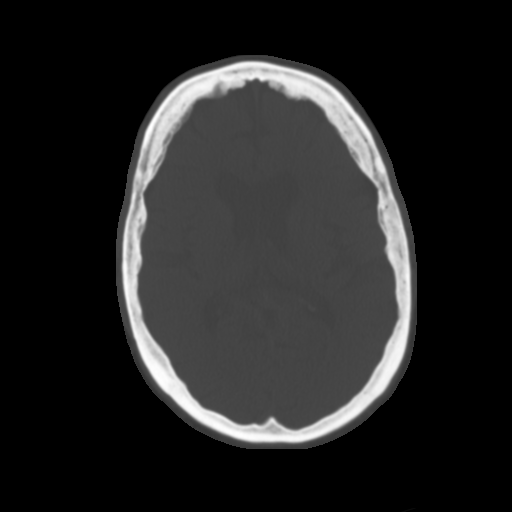
[im 22/35  brain]
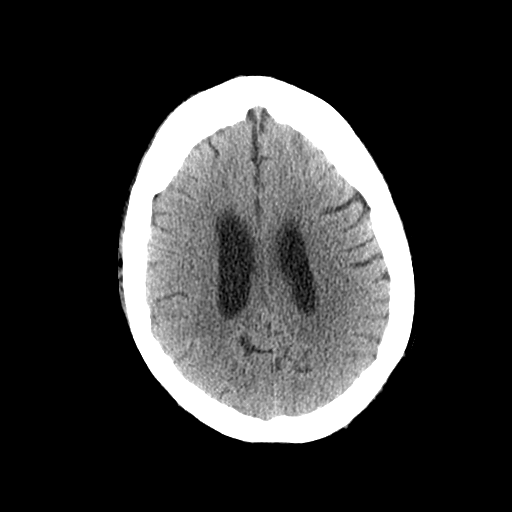
[im 25/35  brain]
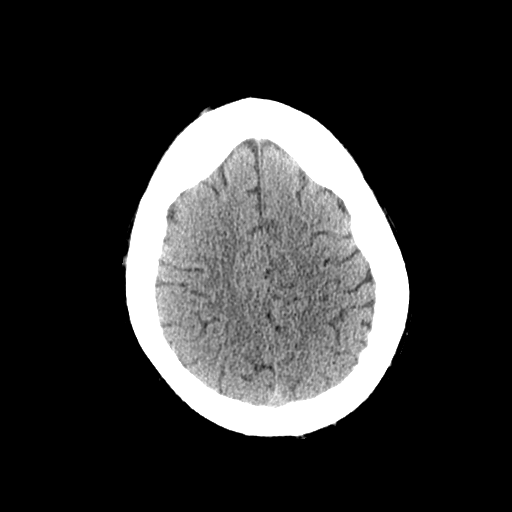
[im 29/35  brain]
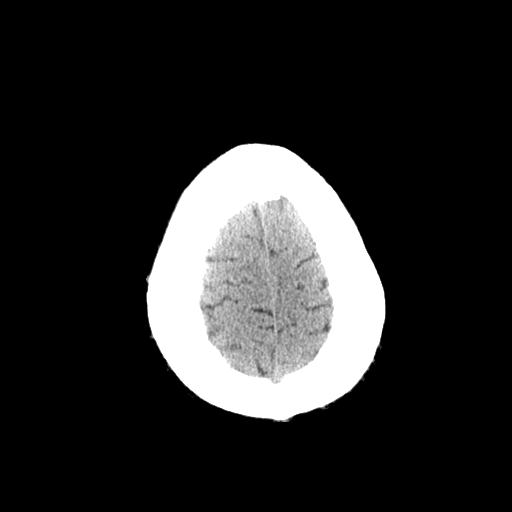
[im 32/35  brain]
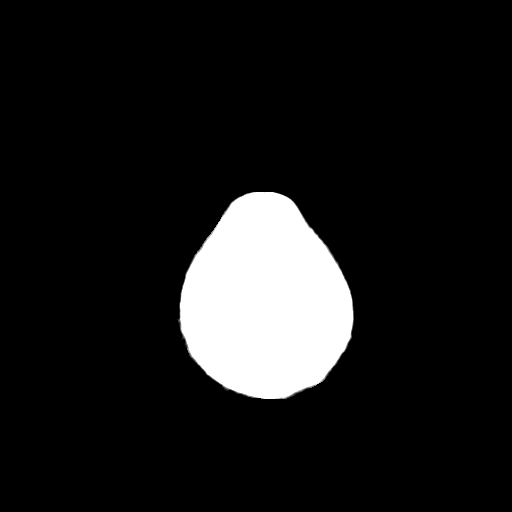
[im 32/35  bone]
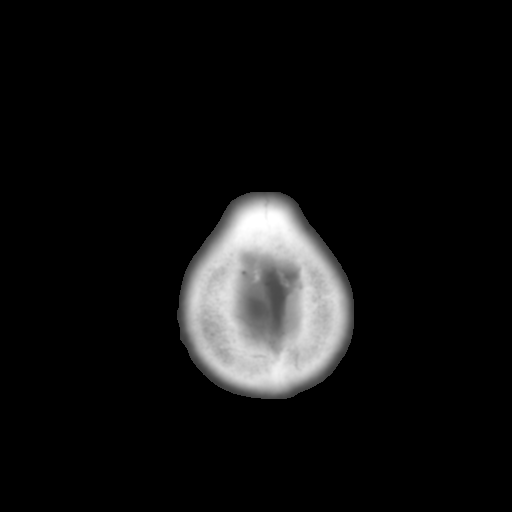

[Series 6: coronal soft tissue · coronal · 0.33mm/px · 3 of 69 slices shown]
[im 23/69  brain]
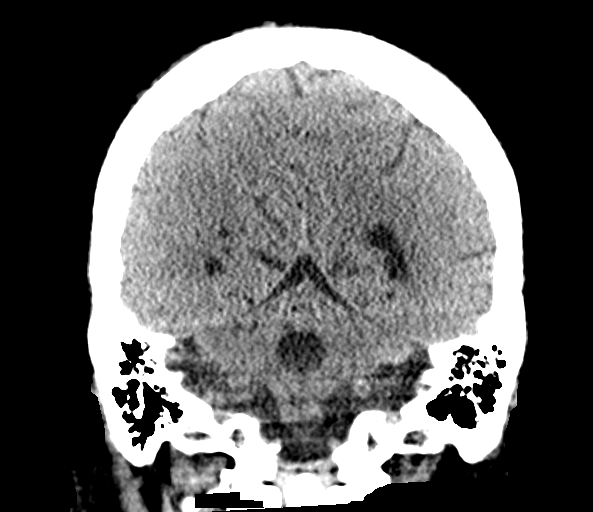
[im 31/69  brain]
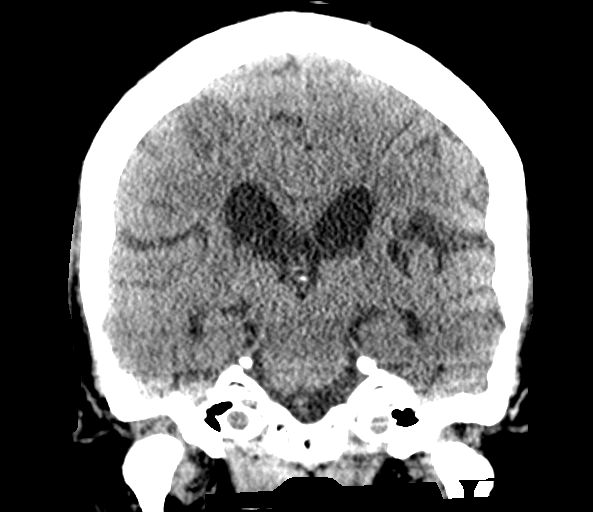
[im 38/69  brain]
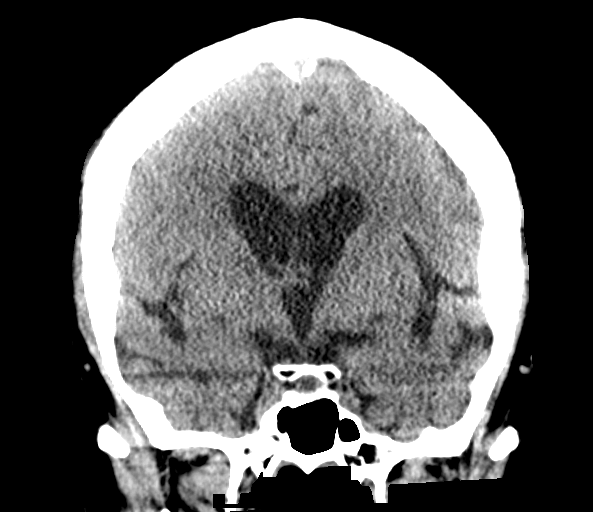

[Series 7: sagittal soft tissue · sagittal · 0.33mm/px · 3 of 66 slices shown]
[im 22/66  brain]
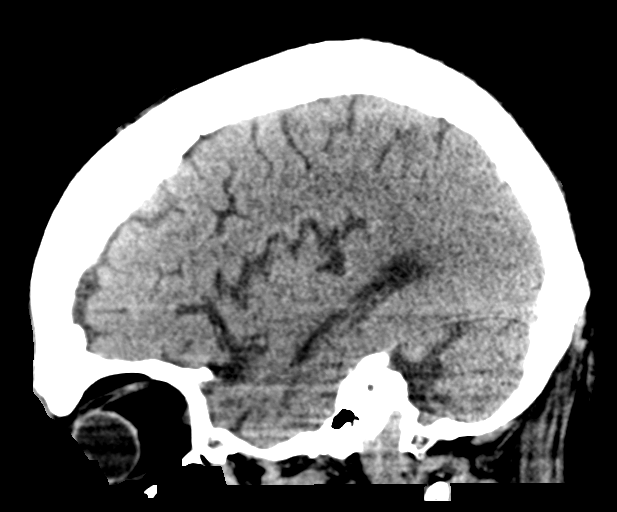
[im 33/66  brain]
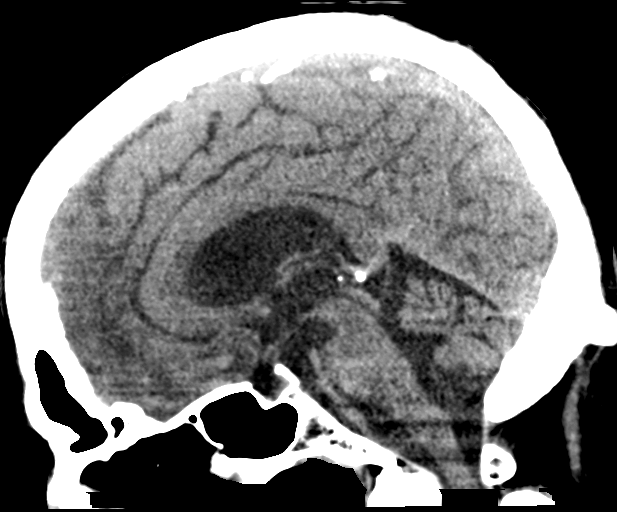
[im 44/66  brain]
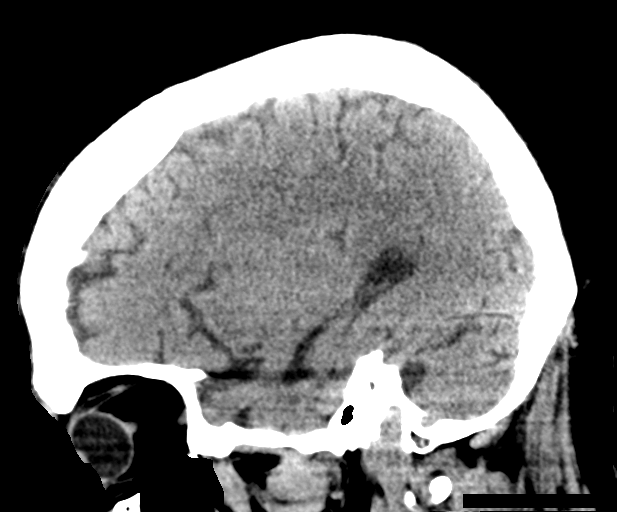

[15 of 47 positions shown; findings below may reference images not displayed]

FINDINGS: CT HEAD FINDINGS

Brain: There is no mass, hemorrhage or extra-axial collection. There
is generalized atrophy without lobar predilection. The brain
parenchyma is normal, without evidence of acute or chronic
infarction.

Vascular: No abnormal hyperdensity of the major intracranial
arteries or dural venous sinuses. No intracranial atherosclerosis.

Skull: The visualized skull base, calvarium and extracranial soft
tissues are normal.

Sinuses/Orbits: No fluid levels or advanced mucosal thickening of
the visualized paranasal sinuses. No mastoid or middle ear effusion.
The orbits are normal.

CT CERVICAL SPINE FINDINGS

Alignment: No static subluxation. Facets are aligned. Occipital
condyles are normally positioned.

Skull base and vertebrae: No acute fracture.

Soft tissues and spinal canal: No prevertebral fluid or swelling. No
visible canal hematoma.

Disc levels: Multilevel degenerative disc disease. No bony spinal
canal stenosis. Foraminal stenosis is worst on the right at C4-5 and
C6-7.

Upper chest: No pneumothorax, pulmonary nodule or pleural effusion.

Other: Normal visualized paraspinal cervical soft tissues.
IMPRESSION: 1. Generalized atrophy without acute intracranial abnormality.
2. No acute fracture or static subluxation of the cervical spine.

## 2020-06-24 NOTE — Telephone Encounter (Signed)
Michelle with Vascular wanted to let Dr. Prince Rome know that patient is Negative for DVT, right arm, but stated that patient has a hematoma on the upper arm. Wasn't sure if Dr. Prince Rome was aware or not.  CB# [phone], pager# [phone].  Please advise.  Thank you

## 2020-06-24 NOTE — Telephone Encounter (Signed)
See doppler results

## 2020-06-24 NOTE — Progress Notes (Signed)
Right upper extremity venous duplex completed. Refer to "CV Proc" under chart review to view preliminary results.  06/24/2020 1:45 PM Eula Fried., MHA, RVT, RDCS, RDMS

## 2020-06-24 NOTE — Telephone Encounter (Signed)
Doppler of arm does not show a blood clot.  I would like to have him try PT (is that available where he lives?).    If still no relief with PT, we'll do some more testing.

## 2020-07-04 NOTE — Telephone Encounter (Signed)
I called and spoke with the patient's nurse, Elveria Rising, giving her the doppler results. She said he is currently getting physical therapy. Advised her to let us know if the patient continues to have pain, despite the PT, and more tests might be ordered.

## 2020-08-09 ENCOUNTER — Other Ambulatory Visit (HOSPITAL_COMMUNITY): Payer: Self-pay | Admitting: Interventional Radiology

## 2020-08-09 DIAGNOSIS — R1310 Dysphagia, unspecified: Secondary | ICD-10-CM

## 2020-08-11 ENCOUNTER — Emergency Department (HOSPITAL_COMMUNITY)
Admission: EM | Admit: 2020-08-11 | Discharge: 2020-08-11 | Disposition: A | Discharge status: Home / Self Care | Payer: Medicare Other | Attending: Emergency Medicine | Admitting: Emergency Medicine

## 2020-08-11 ENCOUNTER — Other Ambulatory Visit: Payer: Self-pay

## 2020-08-11 ENCOUNTER — Encounter (HOSPITAL_COMMUNITY): Payer: Self-pay | Admitting: Emergency Medicine

## 2020-08-11 DIAGNOSIS — E039 Hypothyroidism, unspecified: Secondary | ICD-10-CM | POA: Diagnosis not present

## 2020-08-11 DIAGNOSIS — F0390 Unspecified dementia without behavioral disturbance: Secondary | ICD-10-CM | POA: Insufficient documentation

## 2020-08-11 DIAGNOSIS — Z79899 Other long term (current) drug therapy: Secondary | ICD-10-CM | POA: Diagnosis not present

## 2020-08-11 DIAGNOSIS — Z431 Encounter for attention to gastrostomy: Secondary | ICD-10-CM | POA: Diagnosis present

## 2020-08-11 DIAGNOSIS — R600 Localized edema: Secondary | ICD-10-CM | POA: Insufficient documentation

## 2020-08-11 DIAGNOSIS — Z91040 Latex allergy status: Secondary | ICD-10-CM | POA: Diagnosis not present

## 2020-08-11 DIAGNOSIS — T85528A Displacement of other gastrointestinal prosthetic devices, implants and grafts, initial encounter: Secondary | ICD-10-CM

## 2020-08-11 NOTE — ED Triage Notes (Signed)
Pt presents to ED BIB [facility] from [facility]. Per facility pt c/o pain around G-tube and then pulled it out. Pt oriented to self, disoriented x3. EMS VSS.

## 2020-08-11 NOTE — ED Notes (Signed)
Discharge instructions discussed with pt. Pt verbalized understanding. Pt stable and ambulatory. No signature pad available. 

## 2020-08-11 NOTE — ED Provider Notes (Signed)
[name] Provider Note   CSN: [id number] Arrival date & time: 08/11/20  0003     History Chief Complaint  Patient presents with  . G tube Replacement    [name] is a 54 y.o. male.  HPI Patient sent from nursing home for replacement of G-tube.  Reportedly pulled it out.  Patient really cannot provide any history.  History of quadriplegia and anoxic brain injury.    Past Medical History:  Diagnosis Date  . Addison disease (HCC)   . Anoxic brain injury (HCC)    TBI  . Anxiety   . Aspiration pneumonia (HCC)   . Atrial fibrillation (HCC)   . Chronic constipation 11/23/2011  . Dementia (HCC)   . Dysphagia   . Encephalopathy   . Frontal lobe syndrome   . Gait abnormality    freq falls  . Glaucoma   . Glucocorticoid deficiency (HCC)   . History of heart attack   . History of recurrent UTIs   . Hyperlipemia   . Hypothyroidism   . Mental disorder   . Myocardial infarction (HCC)   . Pneumoperitoneum of unknown etiology 12/21/2011  . Quadriplegia (HCC)   . Reflux   . Seizure disorder (HCC)   . Weakness of both legs     Patient Active Problem List   Diagnosis Date Noted  . PEG Gastrostomy tube in place (HCC) 01/26/2018  . 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 01/26/2018 01/26/2018  . Bedridden 01/26/2018  . Quadriplegia (HCC)   . Abdominal wall abscess 01/25/2018  . Fracture of humerus anatomical neck, right, closed, initial encounter 10/03/2017  . AKI (acute kidney injury) (HCC) 10/03/2017  . UTI (urinary tract infection) 10/03/2017  . Agitation 07/03/2014  . Cardiomyopathy, ischemic 06/18/2014  . 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 Acute psychosis (HCC) 04/19/2014  . Unspecified hypothyroidism 05/16/2013  . Constipation 05/16/2013  . Anemia of other chronic disease 05/16/2013  . Mental disorder 01/03/2013  . Addisons disease (HCC) 01/03/2013  . PEG (percutaneous endoscopic gastrostomy)  adjustment/replacement/removal (HCC) 12/21/2011  . Hypokalemia 11/25/2011  . Thrombocytopenia (HCC) 11/25/2011  . FTT (failure to thrive) in adult 11/25/2011  . Grand mal seizure (HCC) 11/23/2011  . Depression (emotion) 11/23/2011  . History of atrial fibrillation 11/23/2011  . Other specified hemorrhagic conditions (HCC) 11/23/2011  . Glaucoma 11/23/2011  . Hypoglycemia 11/23/2011  . Chronic constipation 11/23/2011  . Anoxic brain injury (HCC) 11/12/2011  . Protein calorie malnutrition (HCC) 11/12/2011  . Hyperchloremia 11/12/2011  . Hyperthyroidism 11/12/2011  . Acute lower UTI 11/12/2011  . Dysphagia     Past Surgical History:  Procedure Laterality Date  . DEBRIDEMENT OF ABDOMINAL WALL ABSCESS  01/26/2018  . HERNIA MESH REMOVAL  01/26/2018   Infected mesh - removed w I&D abd wall abscess  . IR GENERIC HISTORICAL  10/20/2016   IR REPLC GASTRO/COLONIC TUBE PERCUT W/FLUORO 10/20/2016 Oley Balm, MD [facility]  . IR REPLACE G-TUBE SIMPLE WO FLUORO  01/28/2018  . IR REPLC GASTRO/COLONIC TUBE PERCUT W/FLUORO  04/21/2017  . IR REPLC GASTRO/COLONIC TUBE PERCUT W/FLUORO  07/22/2017  . IR REPLC GASTRO/COLONIC TUBE PERCUT W/FLUORO  11/24/2017  . IRRIGATION AND DEBRIDEMENT ABSCESS N/A 01/26/2018   Procedure: IRRIGATION AND DEBRIDEMENT ABDOMINAL WALL ABSCESS WITH REMOVAL OF INFECTED MESH;  Surgeon: Karie Soda, MD;  Location: [facility];  Service: General;  Laterality: N/A;  . NEPHRECTOMY  unknown  . PEG TUBE PLACEMENT         History reviewed.  No pertinent family history.  Social History   Tobacco Use  . Smoking status: Never Smoker  . Smokeless tobacco: Never Used  Vaping Use  . Vaping Use: Never used  Substance Use Topics  . Alcohol use: No  . Drug use: No    Home Medications Prior to Admission medications   Medication Sig Start Date End Date Taking? Authorizing Provider  acetaminophen (TYLENOL) 325 MG tablet Place 650 mg into feeding tube every 4 (four) hours as  needed for mild pain.    [provider]  Amino Acids-Protein Hydrolys (FEEDING SUPPLEMENT, PRO-STAT SUGAR FREE 64,) LIQD Place 30 mLs into feeding tube 2 (two) times daily.     [provider]  aspirin 81 MG chewable tablet Chew 81 mg by mouth daily.    [provider]  Carboxymeth-Glycerin-Polysorb (REFRESH OPTIVE ADVANCED OP) Place 1 drop into both eyes 3 (three) times daily.    [provider]  clonazePAM (KLONOPIN) 0.5 MG tablet Take 0.5 mg by mouth at bedtime.    [provider]  diclofenac Sodium (VOLTAREN) 1 % GEL Apply 4 g topically 4 (four) times daily as needed. 05/29/20   Hilts, Casimiro Needle, MD  divalproex (DEPAKOTE SPRINKLE) 125 MG capsule Take 500 mg by mouth 2 (two) times daily.  07/18/13   [provider]  levothyroxine (SYNTHROID, LEVOTHROID) 150 MCG tablet Place 150 mcg into feeding tube daily before breakfast.     [provider]  metoprolol tartrate (LOPRESSOR) 25 MG tablet Take 12.5 mg by mouth 2 (two) times daily.    [provider]  Multiple Vitamins-Minerals (CERTAVITE/ANTIOXIDANTS) LIQD 15 mLs by PEG Tube route daily at 12 noon.     [provider]  pantoprazole sodium (PROTONIX) 40 mg/20 mL PACK 40 mg by PEG Tube route daily at 12 noon.  11/30/11   [name], MD  polyethylene glycol (MIRALAX / GLYCOLAX) packet 17 g by PEG Tube route 2 (two) times daily. Mix 17gm  In 4-8 ounces of liquid    [provider]  polyvinyl alcohol (LIQUIFILM TEARS) 1.4 % ophthalmic solution Place 1 drop into both eyes 2 (two) times daily.    [provider]  risperiDONE (RISPERDAL M-TABS) 1 MG disintegrating tablet Place 1 mg under the tongue 3 (three) times daily.  05/08/17   [provider]  sennosides (SENOKOT) 8.8 MG/5ML syrup Place 15 mLs into feeding tube 2 (two) times daily.     [provider]  traZODone (DESYREL) 150 MG tablet Take 75 mg by mouth at bedtime.  08/02/13   [provider]    Allergies    Ativan [lorazepam], Latex, Morphine and related, Penicillins, Pyridium [phenazopyridine hcl], and Soy allergy  Review of Systems   Review of Systems  Unable to perform ROS: Dementia    Physical Exam Updated Vital Signs BP 117/73   Pulse 78   Temp 98.2 F (36.8 C) (Oral)   Resp 16   SpO2 94%   Physical Exam Vitals and nursing note reviewed.  HENT:     Head: Atraumatic.  Cardiovascular:     Rate and Rhythm: Regular rhythm.  Pulmonary:     Effort: No respiratory distress.  Abdominal:     Comments: Packed wound in mid abdomen.  Left upper quadrant G-tube site with some mild drainage with no G-tube in place.  Musculoskeletal:     Right lower leg: Edema present.     Left lower leg: Edema present.  Neurological:  Mental Status: He is alert.     Comments: Patient will nod yes or no but really cannot provide history.  Patient is quadriplegic.     ED Results / Procedures / Treatments   Labs (all labs ordered are listed, but only abnormal results are displayed) Labs Reviewed - No data to display  EKG None  Radiology No results found.  Procedures Gastrostomy tube replacement  Date/Time: 08/11/2020 1:12 AM Performed by: Benjiman Core, MD Authorized by: Benjiman Core, MD  Local anesthesia used: no  Anesthesia: Local anesthesia used: no  Sedation: Patient sedated: no  Comments: 31 French G-tube lubricated and placed through existing hole.  Return of gastric contents.  6 cc of saline placed into 6 cc balloon.    (including critical care time)  Medications Ordered in ED Medications - No data to display  ED Course  I have reviewed the triage vital signs and the nursing notes.  Pertinent labs & imaging results that were available during my care of the patient were reviewed by me and considered in my medical decision making (see chart for details).    MDM Rules/Calculators/A&P                          Patient sent  in for G-tube replacement.  24 [place] G-tube replaced.  Return of gastric contents out of the G-tube. Does not appear to need imaging. Final Clinical Impression(s) / ED Diagnoses Final diagnoses:  Gastrojejunostomy tube dislodgement    Rx / DC Orders ED Discharge Orders    None       Benjiman Core, MD 08/11/20 [phone]

## 2020-08-11 NOTE — ED Notes (Signed)
[name] called and report given

## 2020-08-11 NOTE — ED Notes (Signed)
[facility] called to tranport pt °

## 2020-08-11 NOTE — Discharge Instructions (Signed)
A new 24 [place] G-tube has been placed.

## 2020-08-12 ENCOUNTER — Ambulatory Visit (HOSPITAL_COMMUNITY)
Admission: RE | Admit: 2020-08-12 | Discharge: 2020-08-12 | Disposition: A | Discharge status: Home / Self Care | Payer: Medicare Other | Source: Ambulatory Visit | Attending: Interventional Radiology | Admitting: Interventional Radiology

## 2020-08-12 DIAGNOSIS — Z431 Encounter for attention to gastrostomy: Secondary | ICD-10-CM | POA: Insufficient documentation

## 2020-08-12 DIAGNOSIS — R1310 Dysphagia, unspecified: Secondary | ICD-10-CM | POA: Diagnosis not present

## 2020-08-12 HISTORY — PX: IR REPLC GASTRO/COLONIC TUBE PERCUT W/FLUORO: IMG2333

## 2020-08-12 MED ORDER — IOHEXOL 300 MG/ML  SOLN
50.0000 mL | Freq: Once | INTRAMUSCULAR | Status: AC | PRN
  Administered 2020-08-12: 10 mL

## 2020-08-12 MED ORDER — LIDOCAINE VISCOUS HCL 2 % MT SOLN
OROMUCOSAL | Status: AC
  Filled 2020-08-12: qty 15

## 2020-09-14 IMAGING — XA IR REPLACE G-TUBE/COLONIC TUBE
1 series · 12 of 18 positions shown · non-contrast
Comparison: none

INDICATION: 53-year-old male with a chronic indwelling 24 French percutaneous
gastrostomy tube for unspecified dysphagia. Patient presents for
leakage around the tube.

[Series 1: fl angio · 15 acquisitions, 12 frames shown]
[im 1/15]
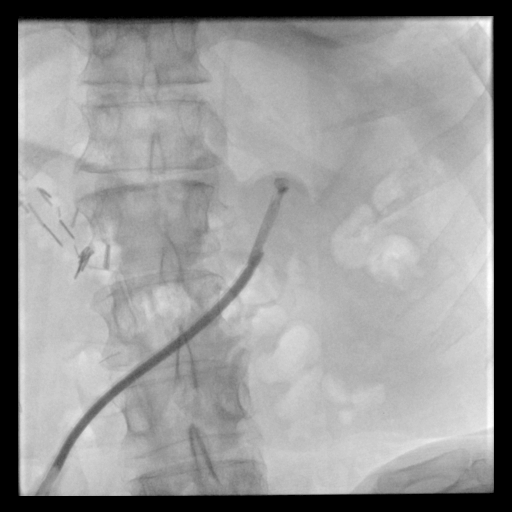
[im 1/15]
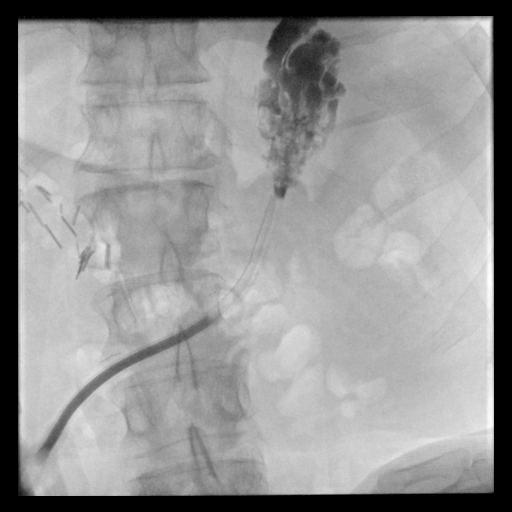
[im 1/15]
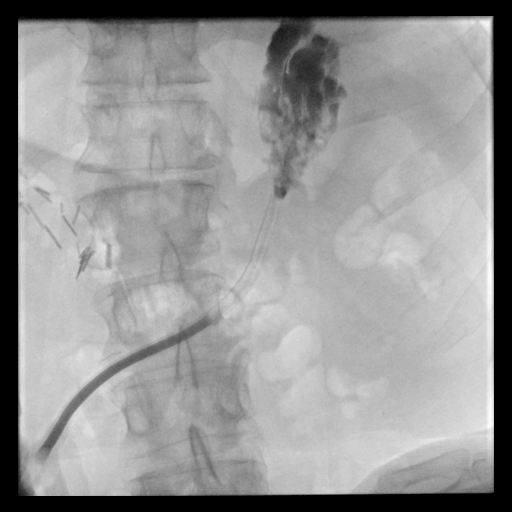
[im 3/15]
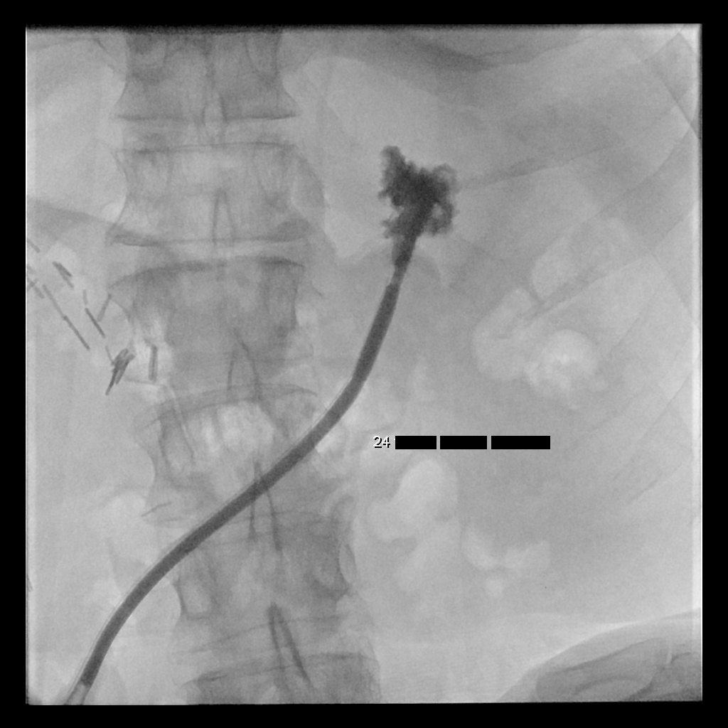
[im 4/15]
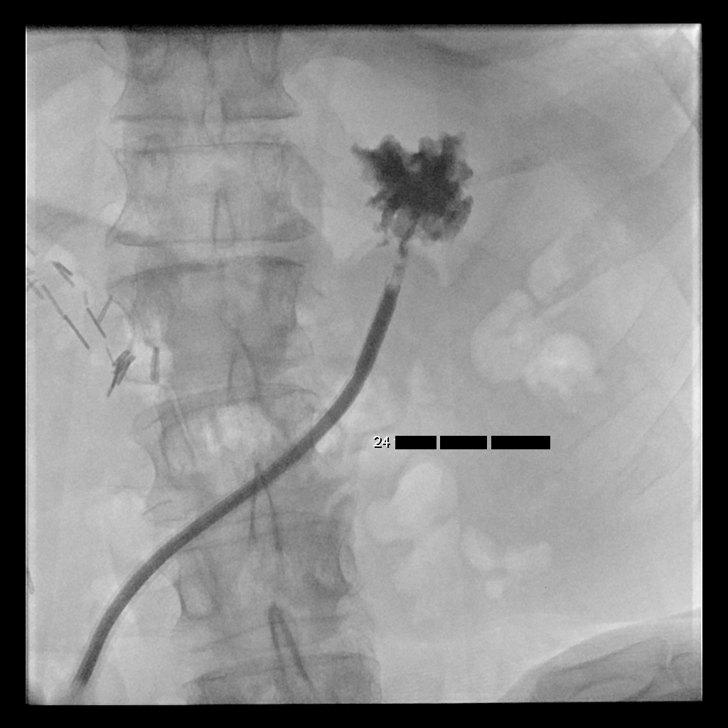
[im 6/15]
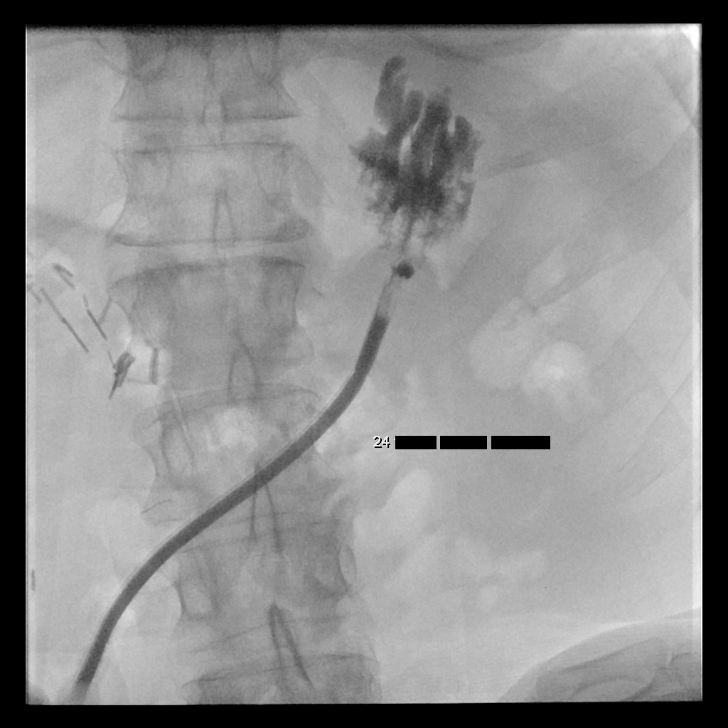
[im 7/15]
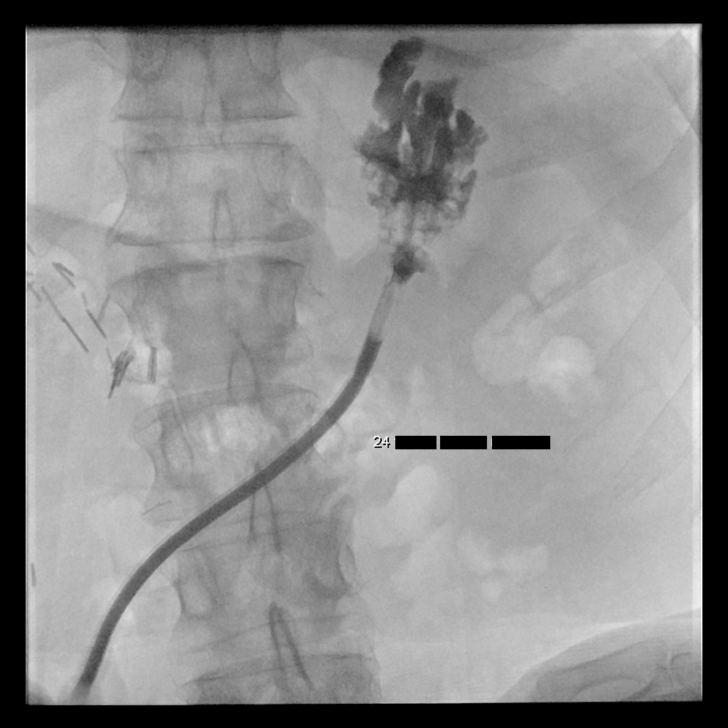
[im 9/15]
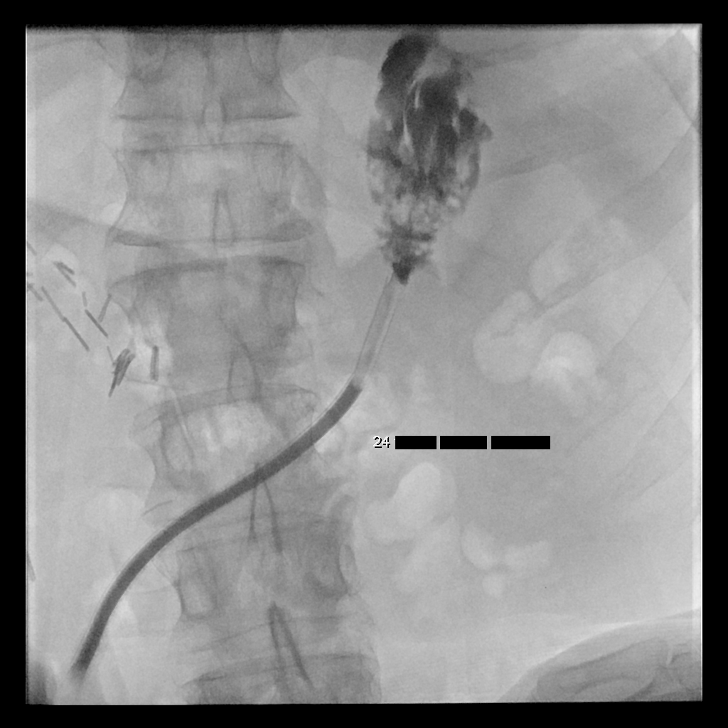
[im 10/15]
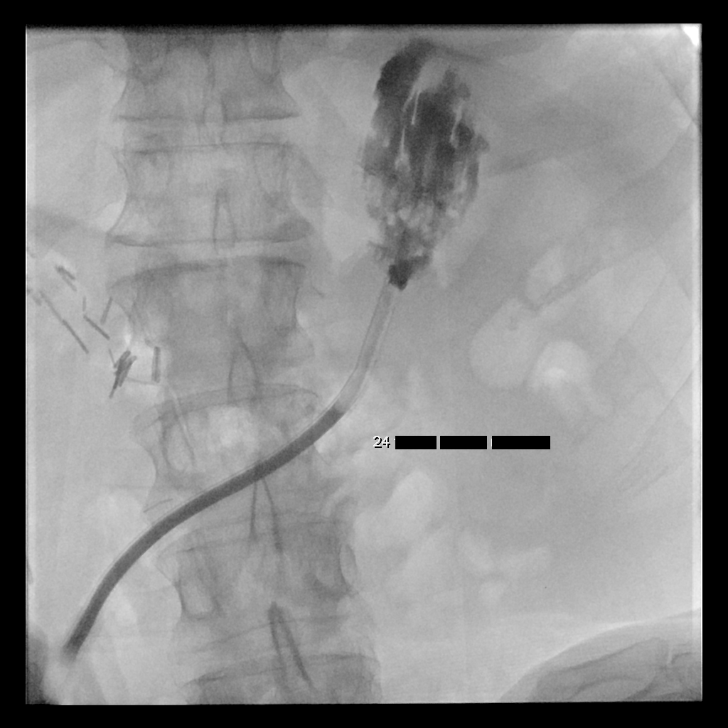
[im 12/15]
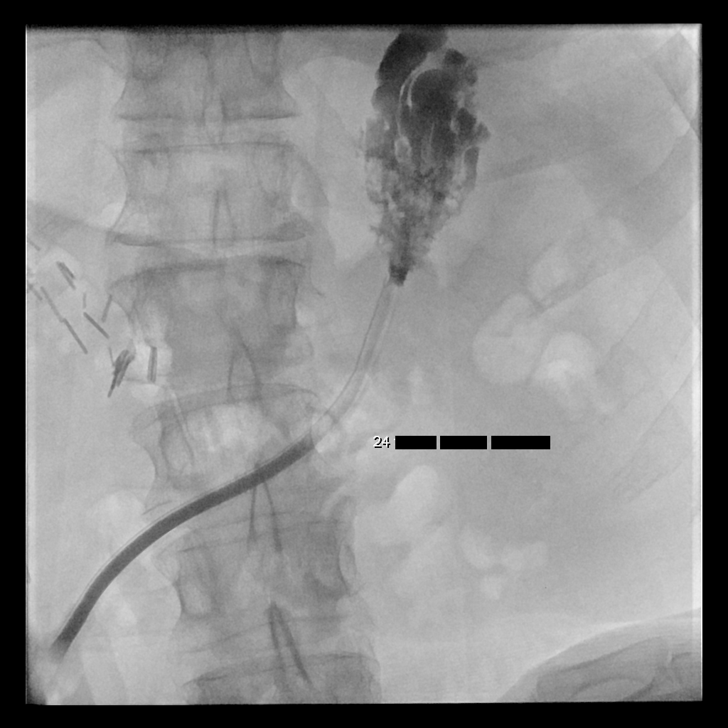
[im 13/15]
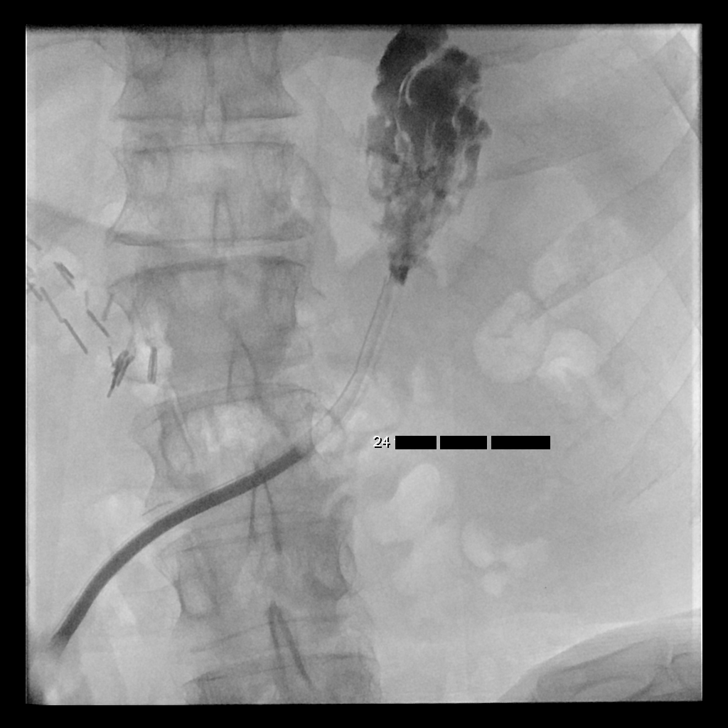
[im 15/15]
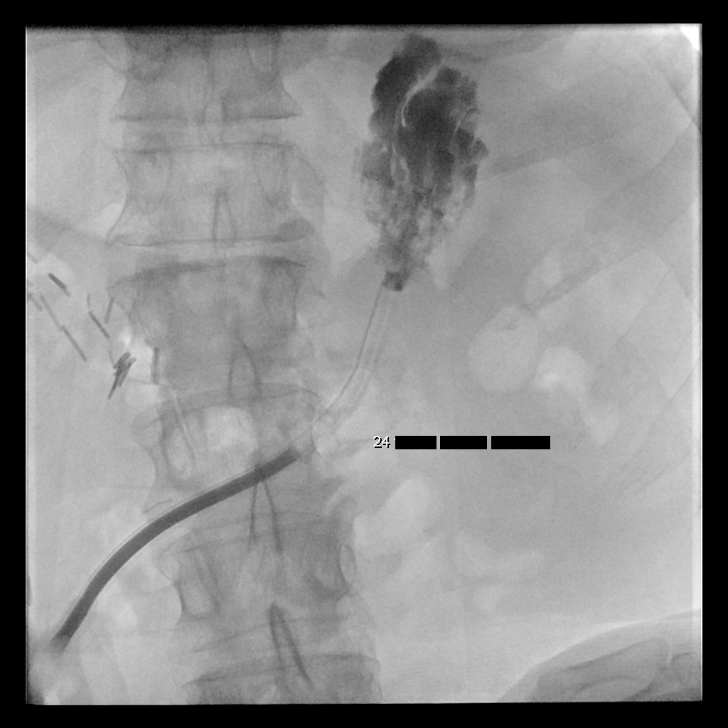

[12 of 18 positions shown; findings below may reference images not displayed]

EXAM:
GASTROSTOMY CATHETER REPLACEMENT

MEDICATIONS:
None.

ANESTHESIA/SEDATION:
None.

CONTRAST:  10mL OMNIPAQUE IOHEXOL 300 MG/ML SOLN - administered into
the gastric lumen.

FLUOROSCOPY TIME:  Fluoroscopy Time: 0 minutes 12 seconds (5 mGy).

COMPLICATIONS:
None immediate.

PROCEDURE:
Informed written consent was obtained from the patient after a
thorough discussion of the procedural risks, benefits and
alternatives. All questions were addressed. A timeout was performed
prior to the initiation of the procedure.

The retention balloon of the existing catheter was deflated and the
tube removed. A new 24 French percutaneous gastrostomy tube was then
lubricated and advanced through the stoma into the stomach. The
retention balloon was filled with 8 mL normal saline and pulled snug
against the anterior abdominal wall. The external bumper was fixed
in place. Approximately 10 mL Omnipaque 300 was then injected
through the tube under fluoroscopy confirming that the tube is
indeed within the gastric lumen.

The tube was then flushed with saline.
IMPRESSION: Successful exchange for a new 24 French percutaneous balloon
retention gastrostomy tube.

## 2021-01-08 ENCOUNTER — Emergency Department (HOSPITAL_COMMUNITY): Payer: Medicare Other

## 2021-01-08 ENCOUNTER — Emergency Department (HOSPITAL_COMMUNITY)
Admission: EM | Admit: 2021-01-08 | Discharge: 2021-01-09 | Disposition: A | Discharge status: Home / Self Care | Payer: Medicare Other | Attending: Emergency Medicine | Admitting: Emergency Medicine

## 2021-01-08 DIAGNOSIS — Z79899 Other long term (current) drug therapy: Secondary | ICD-10-CM | POA: Diagnosis not present

## 2021-01-08 DIAGNOSIS — W0110XA Fall on same level from slipping, tripping and stumbling with subsequent striking against unspecified object, initial encounter: Secondary | ICD-10-CM | POA: Insufficient documentation

## 2021-01-08 DIAGNOSIS — F0390 Unspecified dementia without behavioral disturbance: Secondary | ICD-10-CM | POA: Insufficient documentation

## 2021-01-08 DIAGNOSIS — E039 Hypothyroidism, unspecified: Secondary | ICD-10-CM | POA: Diagnosis not present

## 2021-01-08 DIAGNOSIS — S0181XA Laceration without foreign body of other part of head, initial encounter: Secondary | ICD-10-CM | POA: Diagnosis not present

## 2021-01-08 DIAGNOSIS — Z91040 Latex allergy status: Secondary | ICD-10-CM | POA: Insufficient documentation

## 2021-01-08 DIAGNOSIS — W19XXXA Unspecified fall, initial encounter: Secondary | ICD-10-CM

## 2021-01-08 DIAGNOSIS — S0993XA Unspecified injury of face, initial encounter: Secondary | ICD-10-CM | POA: Diagnosis present

## 2021-01-08 DIAGNOSIS — Z7982 Long term (current) use of aspirin: Secondary | ICD-10-CM | POA: Insufficient documentation

## 2021-01-08 MED ORDER — DIVALPROEX SODIUM 125 MG PO CSDR
500.0000 mg | DELAYED_RELEASE_CAPSULE | Freq: Two times a day (BID) | ORAL | Status: DC
  Administered 2021-01-08: 500 mg via ORAL
  Filled 2021-01-08: qty 4

## 2021-01-08 MED ORDER — RISPERIDONE 1 MG PO TBDP
1.0000 mg | ORAL_TABLET | Freq: Three times a day (TID) | ORAL | Status: DC
  Administered 2021-01-08: 1 mg via SUBLINGUAL
  Filled 2021-01-08: qty 1

## 2021-01-08 NOTE — ED Notes (Signed)
Attempted to give nurse report from [facility], nurse was unavailable will try again soon.

## 2021-01-08 NOTE — ED Notes (Signed)
ED Provider at bedside. 

## 2021-01-08 NOTE — ED Notes (Signed)
Patient transported to XR/CT at this time.

## 2021-01-08 NOTE — ED Triage Notes (Signed)
Transported by Alyssa Grove from [facility]-- witnessed fall injuring chin; has laceration to chin, bleeding controlled at this time. VSS. AAO x 2 which is baseline.

## 2021-01-08 NOTE — ED Provider Notes (Signed)
[facility]-EMERGENCY DEPT Provider Note   CSN: [id number] Arrival date & time: 01/08/21  1424     History Chief Complaint  Patient presents with  . Fall    Ellie Spickler is a 55 y.o. male.  Level 5 caveat secondary to traumatic brain injury.  Patient is a resident at [facility].  History of falls, traumatic brain injury, quadriparesis.  He had a witnessed fall today striking his chin on air conditioning unit.  He is unable to provide any history but can answer some simple yes or no questions.  The history is provided by the EMS personnel and the patient.  Fall This is a recurrent problem. The current episode started 1 to 2 hours ago. The problem has not changed since onset.Pertinent negatives include no chest pain, no abdominal pain, no headaches and no shortness of breath. Nothing aggravates the symptoms. Nothing relieves the symptoms. He has tried nothing for the symptoms. The treatment provided no relief.       Past Medical History:  Diagnosis Date  . Addison disease (HCC)   . Anoxic brain injury (HCC)    TBI  . Anxiety   . Aspiration pneumonia (HCC)   . Atrial fibrillation (HCC)   . Chronic constipation 11/23/2011  . Dementia (HCC)   . Dysphagia   . Encephalopathy   . Frontal lobe syndrome   . Gait abnormality    freq falls  . Glaucoma   . Glucocorticoid deficiency (HCC)   . History of heart attack   . History of recurrent UTIs   . Hyperlipemia   . Hypothyroidism   . Mental disorder   . Myocardial infarction (HCC)   . Pneumoperitoneum of unknown etiology 12/21/2011  . Quadriplegia (HCC)   . Reflux   . Seizure disorder (HCC)   . Weakness of both legs     Patient Active Problem List   Diagnosis Date Noted  . PEG Gastrostomy tube in place (HCC) 01/26/2018  . 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 01/26/2018 01/26/2018  . Bedridden 01/26/2018  . Quadriplegia (HCC)   . Abdominal wall abscess 01/25/2018  . Fracture of  humerus anatomical neck, right, closed, initial encounter 10/03/2017  . AKI (acute kidney injury) (HCC) 10/03/2017  . UTI (urinary tract infection) 10/03/2017  . Agitation 07/03/2014  . Cardiomyopathy, ischemic 06/18/2014  . 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 Acute psychosis (HCC) 04/19/2014  . Unspecified hypothyroidism 05/16/2013  . Constipation 05/16/2013  . Anemia of other chronic disease 05/16/2013  . Mental disorder 01/03/2013  . Addisons disease (HCC) 01/03/2013  . PEG (percutaneous endoscopic gastrostomy) adjustment/replacement/removal (HCC) 12/21/2011  . Hypokalemia 11/25/2011  . Thrombocytopenia (HCC) 11/25/2011  . FTT (failure to thrive) in adult 11/25/2011  . Grand mal seizure (HCC) 11/23/2011  . Depression (emotion) 11/23/2011  . History of atrial fibrillation 11/23/2011  . Other specified hemorrhagic conditions (HCC) 11/23/2011  . Glaucoma 11/23/2011  . Hypoglycemia 11/23/2011  . Chronic constipation 11/23/2011  . Anoxic brain injury (HCC) 11/12/2011  . Protein calorie malnutrition (HCC) 11/12/2011  . Hyperchloremia 11/12/2011  . Hyperthyroidism 11/12/2011  . Acute lower UTI 11/12/2011  . Dysphagia     Past Surgical History:  Procedure Laterality Date  . DEBRIDEMENT OF ABDOMINAL WALL ABSCESS  01/26/2018  . HERNIA MESH REMOVAL  01/26/2018   Infected mesh - removed w I&D abd wall abscess  . IR GENERIC HISTORICAL  10/20/2016   IR REPLC GASTRO/COLONIC TUBE PERCUT W/FLUORO 10/20/2016 Oley Balm, MD [facility]-INTERV RAD  .  IR REPLACE G-TUBE SIMPLE WO FLUORO  01/28/2018  . IR REPLC GASTRO/COLONIC TUBE PERCUT W/FLUORO  04/21/2017  . IR REPLC GASTRO/COLONIC TUBE PERCUT W/FLUORO  07/22/2017  . IR REPLC GASTRO/COLONIC TUBE PERCUT W/FLUORO  11/24/2017  . IR REPLC GASTRO/COLONIC TUBE PERCUT W/FLUORO  08/12/2020  . IRRIGATION AND DEBRIDEMENT ABSCESS N/A 01/26/2018   Procedure: IRRIGATION AND DEBRIDEMENT ABDOMINAL WALL ABSCESS WITH REMOVAL OF INFECTED MESH;  Surgeon: Karie Soda,  MD;  Location: [facility] ORS;  Service: General;  Laterality: N/A;  . NEPHRECTOMY  unknown  . PEG TUBE PLACEMENT         No family history on file.  Social History   Tobacco Use  . Smoking status: Never Smoker  . Smokeless tobacco: Never Used  Vaping Use  . Vaping Use: Never used  Substance Use Topics  . Alcohol use: No  . Drug use: No    Home Medications Prior to Admission medications   Medication Sig Start Date End Date Taking? Authorizing Provider  acetaminophen (TYLENOL) 325 MG tablet Place 650 mg into feeding tube every 4 (four) hours as needed for mild pain.    [provider]  Amino Acids-Protein Hydrolys (FEEDING SUPPLEMENT, PRO-STAT SUGAR FREE 64,) LIQD Place 30 mLs into feeding tube 2 (two) times daily.     [provider]  aspirin 81 MG chewable tablet Chew 81 mg by mouth daily.    [provider]  Carboxymeth-Glycerin-Polysorb (REFRESH OPTIVE ADVANCED OP) Place 1 drop into both eyes 3 (three) times daily.    [provider]  clonazePAM (KLONOPIN) 0.5 MG tablet Take 0.5 mg by mouth at bedtime.    [provider]  diclofenac Sodium (VOLTAREN) 1 % GEL Apply 4 g topically 4 (four) times daily as needed. 05/29/20   Hilts, Casimiro Needle, MD  divalproex (DEPAKOTE SPRINKLE) 125 MG capsule Take 500 mg by mouth 2 (two) times daily.  07/18/13   [provider]  levothyroxine (SYNTHROID, LEVOTHROID) 150 MCG tablet Place 150 mcg into feeding tube daily before breakfast.     [provider]  metoprolol tartrate (LOPRESSOR) 25 MG tablet Take 12.5 mg by mouth 2 (two) times daily.    [provider]  Multiple Vitamins-Minerals (CERTAVITE/ANTIOXIDANTS) LIQD 15 mLs by PEG Tube route daily at 12 noon.     [provider]  pantoprazole sodium (PROTONIX) 40 mg/20 mL PACK 40 mg by PEG Tube route daily at 12 noon.  11/30/11   [name], MD  polyethylene glycol (MIRALAX / GLYCOLAX) packet 17 g by PEG Tube route 2 (two) times  daily. Mix 17gm  In 4-8 ounces of liquid    [provider]  polyvinyl alcohol (LIQUIFILM TEARS) 1.4 % ophthalmic solution Place 1 drop into both eyes 2 (two) times daily.    [provider]  risperiDONE (RISPERDAL M-TABS) 1 MG disintegrating tablet Place 1 mg under the tongue 3 (three) times daily.  05/08/17   [provider]  sennosides (SENOKOT) 8.8 MG/5ML syrup Place 15 mLs into feeding tube 2 (two) times daily.     [provider]  traZODone (DESYREL) 150 MG tablet Take 75 mg by mouth at bedtime.  08/02/13   [provider]    Allergies    Ativan [lorazepam], Latex, Morphine and related, Penicillins, Pyridium [phenazopyridine hcl], and Soy allergy  Review of Systems   Review of Systems  Unable to perform ROS: Other (traumatic brain injury)  Respiratory: Negative for shortness of breath.   Cardiovascular: Negative for chest  pain.  Gastrointestinal: Negative for abdominal pain.  Skin: Positive for wound.  Neurological: Negative for headaches.    Physical Exam Updated Vital Signs BP 110/78 (BP Location: Left Arm)   Pulse 98   Temp 99 F (37.2 C) (Oral)   Resp 16   SpO2 98%   Physical Exam Vitals and nursing note reviewed.  Constitutional:      Appearance: Normal appearance. He is well-developed and well-nourished.  HENT:     Head: Normocephalic.     Comments: Laceration of chin, apporx 4 cm Eyes:     Conjunctiva/sclera: Conjunctivae normal.  Neck:     Comments: Trach midline in cervical collar Cardiovascular:     Rate and Rhythm: Normal rate and regular rhythm.     Heart sounds: No murmur heard.   Pulmonary:     Effort: Pulmonary effort is normal. No respiratory distress.     Breath sounds: Normal breath sounds.  Abdominal:     Palpations: Abdomen is soft.     Tenderness: There is no abdominal tenderness. There is no guarding.     Comments: PEG tube  Musculoskeletal:        General: Tenderness (Right humerus) present. No  edema.  Skin:    General: Skin is warm and dry.  Neurological:     Mental Status: He is alert. Mental status is at baseline.     Comments: Patient is awake and alert.  Oriented to self.  Moderate strength in arms and limited strength in legs.  Right arm limited by pain.  Psychiatric:        Mood and Affect: Mood and affect normal.     ED Results / Procedures / Treatments   Labs (all labs ordered are listed, but only abnormal results are displayed) Labs Reviewed - No data to display  EKG None  Radiology CT Head Wo Contrast  Result Date: 01/08/2021 CLINICAL DATA:  Witnessed fall EXAM: CT HEAD WITHOUT CONTRAST CT CERVICAL SPINE WITHOUT CONTRAST TECHNIQUE: Multidetector CT imaging of the head and cervical spine was performed following the standard protocol without intravenous contrast. Multiplanar CT image reconstructions of the cervical spine were also generated. COMPARISON:  CT brain 05/22/2020, CT brain and cervical spine 03/04/2020 FINDINGS: CT HEAD FINDINGS Brain: No acute territorial infarction, hemorrhage, or intracranial mass. Mild periventricular white matter hypodensity consistent with chronic small vessel ischemic change. Mild atrophy. Stable ventricle size. Vascular: No hyperdense vessels.  Carotid vascular calcification Skull: Normal. Negative for fracture or focal lesion. Sinuses/Orbits: No acute finding.  Chronic nasal bone deformity Other: None CT CERVICAL SPINE FINDINGS Alignment: Mild reversal of cervical lordosis. No subluxation. Facet alignment is maintained. Skull base and vertebrae: No acute fracture. No primary bone lesion or focal pathologic process. Soft tissues and spinal canal: No prevertebral fluid or swelling. No visible canal hematoma. Disc levels: Moderate diffuse degenerative changes throughout the cervical spine with multiple level disc space narrowing and spurring. Facet degenerative change at multiple levels with foraminal narrowing at multiple levels, most  notable at bilateral C6-C7 and on the right side at C4-C5. Upper chest: Negative. Other: None IMPRESSION: 1. No CT evidence for acute intracranial abnormality. Atrophy and mild chronic small vessel ischemic change of the white matter. 2. Reversal of cervical lordosis with degenerative changes. No acute osseous abnormality. Electronically Signed   By: Jasmine Pang M.D.   On: 01/08/2021 16:20   CT Cervical Spine Wo Contrast  Result Date: 01/08/2021 CLINICAL DATA:  Witnessed fall EXAM: CT HEAD WITHOUT CONTRAST CT  CERVICAL SPINE WITHOUT CONTRAST TECHNIQUE: Multidetector CT imaging of the head and cervical spine was performed following the standard protocol without intravenous contrast. Multiplanar CT image reconstructions of the cervical spine were also generated. COMPARISON:  CT brain 05/22/2020, CT brain and cervical spine 03/04/2020 FINDINGS: CT HEAD FINDINGS Brain: No acute territorial infarction, hemorrhage, or intracranial mass. Mild periventricular white matter hypodensity consistent with chronic small vessel ischemic change. Mild atrophy. Stable ventricle size. Vascular: No hyperdense vessels.  Carotid vascular calcification Skull: Normal. Negative for fracture or focal lesion. Sinuses/Orbits: No acute finding.  Chronic nasal bone deformity Other: None CT CERVICAL SPINE FINDINGS Alignment: Mild reversal of cervical lordosis. No subluxation. Facet alignment is maintained. Skull base and vertebrae: No acute fracture. No primary bone lesion or focal pathologic process. Soft tissues and spinal canal: No prevertebral fluid or swelling. No visible canal hematoma. Disc levels: Moderate diffuse degenerative changes throughout the cervical spine with multiple level disc space narrowing and spurring. Facet degenerative change at multiple levels with foraminal narrowing at multiple levels, most notable at bilateral C6-C7 and on the right side at C4-C5. Upper chest: Negative. Other: None IMPRESSION: 1. No CT evidence  for acute intracranial abnormality. Atrophy and mild chronic small vessel ischemic change of the white matter. 2. Reversal of cervical lordosis with degenerative changes. No acute osseous abnormality. Electronically Signed   By: Jasmine PangKim  Fujinaga M.D.   On: 01/08/2021 16:20   DG Humerus Right  Result Date: 01/08/2021 CLINICAL DATA:  fall pain EXAM: RIGHT HUMERUS - 2+ VIEW COMPARISON:  06/10/2020 and prior. FINDINGS: Chronic posttraumatic deformity involving the proximal humerus. No discrete lucency to suggest acute fracture. The shoulder and elbow joints remain approximated. Acromioclavicular osteoarthrosis. Soft tissues are within normal limits. 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 No discrete lucency to suggest acute fracture. Electronically Signed   By: Stana Buntinghikanele  Emekauwa M.D.   On: 01/08/2021 15:24    Procedures .Marland Kitchen.Laceration Repair  Date/Time: 01/08/2021 5:31 PM Performed by: Terrilee FilesButler, Daleen Steinhaus C, MD Authorized by: Terrilee FilesButler, Ulice Follett C, MD   Consent:    Consent obtained:  Verbal   Consent given by:  Patient   Risks discussed:  Infection, pain, poor cosmetic result, poor wound healing and retained foreign body   Alternatives discussed:  No treatment and delayed treatment Universal protocol:    Procedure explained and questions answered to patient or proxy's satisfaction: yes     Patient identity confirmed:  Verbally with patient Anesthesia:    Anesthesia method:  Local infiltration   Local anesthetic:  Lidocaine 1% w/o epi Laceration details:    Location:  Face   Face location:  Chin   Length (cm):  4 Pre-procedure details:    Preparation:  Patient was prepped and draped in usual sterile fashion Exploration:    Contaminated: no   Treatment:    Area cleansed with:  Saline   Amount of cleaning:  Standard   Irrigation solution:  Sterile saline   Debridement:  None Skin repair:    Repair method:  Sutures   Suture size:  5-0   Suture material:   Prolene   Suture technique:  Simple interrupted   Number of sutures:  4 Approximation:    Approximation:  Close Post-procedure details:    Dressing:  Sterile dressing   Procedure completion:  Tolerated well, no immediate complications   (including critical care time)  Medications Ordered in ED Medications - No data to display  ED Course  I have reviewed the triage vital  signs and the nursing notes.  Pertinent labs & imaging results that were available during my care of the patient were reviewed by me and considered in my medical decision making (see chart for details).  Clinical Course as of 01/08/21 1731  Wed Jan 08, 2021  1639 CT head showing no acute intracranial, CT cervical showing reversal of cervical lordosis with no acute fractures.  Patient's humerus x-ray shows chronic fracture no acute [MB]    Clinical Course User Index [MB] Terrilee FilesButler, Mauria Asquith C, MD   MDM Rules/Calculators/A&P                         55 year old male history of traumatic brain injury quadriparesis here after a fall.  Reviewed prior records and patient is often noted to be falling.  He has some chronic right humerus pain per notes from orthopedics and an old fracture there.  We will reimage that to make sure nothing new is happening.  Has laceration to his chin.  In cervical collar.  We will get CT head and neck due to inability to protect himself with a fall.  He does have a soft helmet on which hopefully protected him.  Final Clinical Impression(s) / ED Diagnoses Final diagnoses:  Fall, initial encounter  Chin laceration, initial encounter    Rx / DC Orders ED Discharge Orders    None       Terrilee FilesButler, Lavel Rieman C, MD 01/09/21 1054

## 2021-01-08 NOTE — Discharge Instructions (Addendum)
You were seen in the emergency department for evaluation of injuries from a fall.  You had a CAT scan of your head and cervical spine along with x-rays of your right humerus.  The humerus x-ray shows your old fracture but no new fracture.  Your head and cervical spine did not show any acute findings.  Your chin laceration was sutured and the sutures will need to be removed in 5 to 7 days.  Return to the emergency department for any worsening or concerning symptoms

## 2021-01-08 NOTE — ED Notes (Signed)
Pt is hitting at staff and cursing. He will not stay in his bed, provider made aware.

## 2021-01-21 ENCOUNTER — Other Ambulatory Visit: Payer: Self-pay

## 2021-01-21 ENCOUNTER — Emergency Department (HOSPITAL_COMMUNITY)
Admission: EM | Admit: 2021-01-21 | Discharge: 2021-01-22 | Disposition: A | Discharge status: Home / Self Care | Payer: Medicare Other | Attending: Emergency Medicine | Admitting: Emergency Medicine

## 2021-01-21 ENCOUNTER — Encounter (HOSPITAL_COMMUNITY): Payer: Self-pay | Admitting: Emergency Medicine

## 2021-01-21 DIAGNOSIS — S0121XA Laceration without foreign body of nose, initial encounter: Secondary | ICD-10-CM

## 2021-01-21 DIAGNOSIS — Z7982 Long term (current) use of aspirin: Secondary | ICD-10-CM | POA: Insufficient documentation

## 2021-01-21 DIAGNOSIS — F0390 Unspecified dementia without behavioral disturbance: Secondary | ICD-10-CM | POA: Insufficient documentation

## 2021-01-21 DIAGNOSIS — Z91040 Latex allergy status: Secondary | ICD-10-CM | POA: Diagnosis not present

## 2021-01-21 DIAGNOSIS — W19XXXA Unspecified fall, initial encounter: Secondary | ICD-10-CM | POA: Insufficient documentation

## 2021-01-21 DIAGNOSIS — Z79899 Other long term (current) drug therapy: Secondary | ICD-10-CM | POA: Diagnosis not present

## 2021-01-21 DIAGNOSIS — Y92129 Unspecified place in nursing home as the place of occurrence of the external cause: Secondary | ICD-10-CM | POA: Insufficient documentation

## 2021-01-21 DIAGNOSIS — E039 Hypothyroidism, unspecified: Secondary | ICD-10-CM | POA: Diagnosis not present

## 2021-01-21 DIAGNOSIS — S0992XA Unspecified injury of nose, initial encounter: Secondary | ICD-10-CM | POA: Diagnosis present

## 2021-01-21 DIAGNOSIS — R109 Unspecified abdominal pain: Secondary | ICD-10-CM | POA: Insufficient documentation

## 2021-01-21 NOTE — ED Triage Notes (Signed)
Pt arrived via EMS from [facility]. Pt had an unwitnessed fall around 10pm. Staff walked into pt's room and found him sitting on the floor with a cut on his nose and bruising under his left eye. Pt also has bleeding and discharge from his feeding tube. Pt states he has abdominal pain at a 6/10.

## 2021-01-21 NOTE — ED Provider Notes (Incomplete)
[facility]-EMERGENCY DEPT Provider Note   CSN: [id number] Arrival date & time: 01/21/21  2242   History Chief Complaint  Patient presents with  . Fall  . Abdominal Pain    [name] is a 55 y.o. male.  The history is provided by the nursing home. The history is limited by the condition of the patient (Dementia).  Fall Associated symptoms include abdominal pain.  Abdominal Pain He has history of  Past Medical History:  Diagnosis Date  . Addison disease (HCC)   . Anoxic brain injury (HCC)    TBI  . Anxiety   . Aspiration pneumonia (HCC)   . Atrial fibrillation (HCC)   . Chronic constipation 11/23/2011  . Dementia (HCC)   . Dysphagia   . Encephalopathy   . Frontal lobe syndrome   . Gait abnormality    freq falls  . Glaucoma   . Glucocorticoid deficiency (HCC)   . History of heart attack   . History of recurrent UTIs   . Hyperlipemia   . Hypothyroidism   . Mental disorder   . Myocardial infarction (HCC)   . Pneumoperitoneum of unknown etiology 12/21/2011  . Quadriplegia (HCC)   . Reflux   . Seizure disorder (HCC)   . Weakness of both legs     Patient Active Problem List   Diagnosis Date Noted  . PEG Gastrostomy tube in place (HCC) 01/26/2018  . 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 01/26/2018 01/26/2018  . Bedridden 01/26/2018  . Quadriplegia (HCC)   . Abdominal wall abscess 01/25/2018  . Fracture of humerus anatomical neck, right, closed, initial encounter 10/03/2017  . AKI (acute kidney injury) (HCC) 10/03/2017  . UTI (urinary tract infection) 10/03/2017  . Agitation 07/03/2014  . Cardiomyopathy, ischemic 06/18/2014  . 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 Acute psychosis (HCC) 04/19/2014  . Unspecified hypothyroidism 05/16/2013  . Constipation 05/16/2013  . Anemia of other chronic disease 05/16/2013  . Mental disorder 01/03/2013  . Addisons disease (HCC) 01/03/2013  . PEG (percutaneous endoscopic gastrostomy)  adjustment/replacement/removal (HCC) 12/21/2011  . Hypokalemia 11/25/2011  . Thrombocytopenia (HCC) 11/25/2011  . FTT (failure to thrive) in adult 11/25/2011  . Grand mal seizure (HCC) 11/23/2011  . Depression (emotion) 11/23/2011  . History of atrial fibrillation 11/23/2011  . Other specified hemorrhagic conditions (HCC) 11/23/2011  . Glaucoma 11/23/2011  . Hypoglycemia 11/23/2011  . Chronic constipation 11/23/2011  . Anoxic brain injury (HCC) 11/12/2011  . Protein calorie malnutrition (HCC) 11/12/2011  . Hyperchloremia 11/12/2011  . Hyperthyroidism 11/12/2011  . Acute lower UTI 11/12/2011  . Dysphagia     Past Surgical History:  Procedure Laterality Date  . DEBRIDEMENT OF ABDOMINAL WALL ABSCESS  01/26/2018  . HERNIA MESH REMOVAL  01/26/2018   Infected mesh - removed w I&D abd wall abscess  . IR GENERIC HISTORICAL  10/20/2016   IR REPLC GASTRO/COLONIC TUBE PERCUT W/FLUORO 10/20/2016 Oley Balm, MD [facility]INTER[facility]  . IR REPLACE G-TUBE SIMPLE WO FLUORO  01/28/2018  . IR REPLC GASTRO/COLONIC TUBE PERCUT W/FLUORO  04/21/2017  . IR REPLC GASTRO/COLONIC TUBE PERCUT W/FLUORO  07/22/2017  . IR REPLC GASTRO/COLONIC TUBE PERCUT W/FLUORO  11/24/2017  . IR REPLC GASTRO/COLONIC TUBE PERCUT W/FLUORO  08/12/2020  . IRRIGATION AND DEBRIDEMENT ABSCESS N/A 01/26/2018   Procedure: IRRIGATION AND DEBRIDEMENT ABDOMINAL WALL ABSCESS WITH REMOVAL OF INFECTED MESH;  Surgeon: Karie Soda, MD;  Location: [facility]S;  Service: General;  Laterality: N/A;  . NEPHRECTOMY  unknown  . PEG TUBE PLACEMENT  History reviewed. No pertinent family history.  Social History   Tobacco Use  . Smoking status: Never Smoker  . Smokeless tobacco: Never Used  Vaping Use  . Vaping Use: Never used  Substance Use Topics  . Alcohol use: No  . Drug use: No    Home Medications Prior to Admission medications   Medication Sig Start Date End Date Taking? Authorizing Provider  acetaminophen (TYLENOL) 325 MG tablet  Place 650 mg into feeding tube every 4 (four) hours as needed for mild pain.    [provider]  Amino Acids-Protein Hydrolys (FEEDING SUPPLEMENT, PRO-STAT SUGAR FREE 64,) LIQD Place 30 mLs into feeding tube 2 (two) times daily.     [provider]  aspirin 81 MG chewable tablet Chew 81 mg by mouth daily.    [provider]  Carboxymeth-Glycerin-Polysorb (REFRESH OPTIVE ADVANCED OP) Place 1 drop into both eyes 3 (three) times daily.    [provider]  clonazePAM (KLONOPIN) 0.5 MG tablet Take 0.5 mg by mouth at bedtime.    [provider]  diclofenac Sodium (VOLTAREN) 1 % GEL Apply 4 g topically 4 (four) times daily as needed. 05/29/20   Hilts, Casimiro Needle, MD  divalproex (DEPAKOTE SPRINKLE) 125 MG capsule Take 500 mg by mouth 2 (two) times daily.  07/18/13   [provider]  levothyroxine (SYNTHROID, LEVOTHROID) 150 MCG tablet Place 150 mcg into feeding tube daily before breakfast.     [provider]  metoprolol tartrate (LOPRESSOR) 25 MG tablet Take 12.5 mg by mouth 2 (two) times daily.    [provider]  Multiple Vitamins-Minerals (CERTAVITE/ANTIOXIDANTS) LIQD 15 mLs by PEG Tube route daily at 12 noon.     [provider]  pantoprazole sodium (PROTONIX) 40 mg/20 mL PACK 40 mg by PEG Tube route daily at 12 noon.  11/30/11   [name], MD  polyethylene glycol (MIRALAX / GLYCOLAX) packet 17 g by PEG Tube route 2 (two) times daily. Mix 17gm  In 4-8 ounces of liquid    [provider]  polyvinyl alcohol (LIQUIFILM TEARS) 1.4 % ophthalmic solution Place 1 drop into both eyes 2 (two) times daily.    [provider]  risperiDONE (RISPERDAL M-TABS) 1 MG disintegrating tablet Place 1 mg under the tongue 3 (three) times daily.  05/08/17   [provider]  sennosides (SENOKOT) 8.8 MG/5ML syrup Place 15 mLs into feeding tube 2 (two) times daily.     [provider]  traZODone (DESYREL) 150 MG  tablet Take 75 mg by mouth at bedtime.  08/02/13   [provider]    Allergies    Ativan [lorazepam], Latex, Morphine and related, Penicillins, Pyridium [phenazopyridine hcl], and Soy allergy  Review of Systems   Review of Systems  Unable to perform ROS: Dementia  Gastrointestinal: Positive for abdominal pain.    Physical Exam Updated Vital Signs BP 101/72   Pulse 88   Temp 98.8 F (37.1 C) (Oral)   Ht 5\' 9"  (1.753 m)   Wt 90 kg   SpO2 96%   BMI 29.30 kg/m   Physical Exam Vitals and nursing note reviewed.   *** year old ***male, resting comfortably and in no acute distress. Vital signs are ***. Oxygen saturation is ***%, which is normal. Head is normocephalic and atraumatic. PERRLA, EOMI. Oropharynx is clear. Neck is nontender and supple without adenopathy or JVD. Back is nontender and there is no CVA tenderness. Lungs are clear without rales, wheezes, or rhonchi.  Chest is nontender. Heart has regular rate and rhythm without murmur. Abdomen is soft, flat, nontender without masses or hepatosplenomegaly and peristalsis is normoactive. Extremities have no cyanosis or edema, full range of motion is present. Skin is warm and dry without rash. Neurologic: Mental status is normal, cranial nerves are intact, there are no motor or sensory deficits.  ED Results / Procedures / Treatments   Labs (all labs ordered are listed, but only abnormal results are displayed) Labs Reviewed - No data to display  EKG None  Radiology No results found.  Procedures .Marland KitchenLaceration Repair  Date/Time: 01/21/2021 11:55 PM Performed by: Dione Booze, MD Authorized by: Dione Booze, MD   Consent:    Consent obtained: Implied consent.   Risks, benefits, and alternatives were discussed: not applicable   Universal protocol:    Required blood products, implants, devices, and special equipment available: yes     Site/side marked: yes     Immediately prior to procedure, a time out was  called: yes     Patient identity confirmed:  Hospital-assigned identification number and arm band Anesthesia:    Anesthesia method:  None Laceration details:    Location:  Face   Face location:  Nose   Length (cm):  1   Depth (mm):  1 Pre-procedure details:    Preparation:  Patient was prepped and draped in usual sterile fashion Exploration:    Limited defect created (wound extended): no     Hemostasis achieved with:  Direct pressure   Wound extent: no foreign bodies/material noted     Contaminated: no   Treatment:    Area cleansed with:  Saline   Amount of cleaning:  Standard   Debridement:  None   Undermining:  None   Scar revision: no   Skin repair:    Repair method:  Tissue adhesive Approximation:    Approximation:  Close Repair type:    Repair type:  Simple Post-procedure details:    Dressing:  Open (no dressing)   Procedure completion:  Tolerated well, no immediate complications   {Remember to document critical care time when appropriate:1}  Medications Ordered in ED Medications - No data to display  ED Course  I have reviewed the triage vital signs and the nursing notes.  Pertinent labs & imaging results that were available during my care of the patient were reviewed by me and considered in my medical decision making (see chart for details).    MDM Rules/Calculators/A&P                          *** Final Clinical Impression(s) / ED Diagnoses Final diagnoses:  None    Rx / DC Orders ED Discharge Orders    None

## 2021-01-21 NOTE — ED Provider Notes (Signed)
[facility]-EMERGENCY DEPT Provider Note   CSN: [id number] Arrival date & time: 01/21/21  2242   History Chief Complaint  Patient presents with  . Fall  . Abdominal Pain    [name] is a 55 y.o. male.  The history is provided by the nursing home. The history is limited by the condition of the patient (Dementia).  Fall Associated symptoms include abdominal pain.  Abdominal Pain He has history of hyperlipidemia, atrial fibrillation not on anticoagulation, anoxic brain injury, seizure disorder and was sent here from skilled nursing facility where he had an unwitnessed fall.  He has some bleeding on the side of his nose and there was some blood in the dressings around his feeding tube.  Past Medical History:  Diagnosis Date  . Addison disease (HCC)   . Anoxic brain injury (HCC)    TBI  . Anxiety   . Aspiration pneumonia (HCC)   . Atrial fibrillation (HCC)   . Chronic constipation 11/23/2011  . Dementia (HCC)   . Dysphagia   . Encephalopathy   . Frontal lobe syndrome   . Gait abnormality    freq falls  . Glaucoma   . Glucocorticoid deficiency (HCC)   . History of heart attack   . History of recurrent UTIs   . Hyperlipemia   . Hypothyroidism   . Mental disorder   . Myocardial infarction (HCC)   . Pneumoperitoneum of unknown etiology 12/21/2011  . Quadriplegia (HCC)   . Reflux   . Seizure disorder (HCC)   . Weakness of both legs     Patient Active Problem List   Diagnosis Date Noted  . PEG Gastrostomy tube in place (HCC) 01/26/2018  . 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 01/26/2018 01/26/2018  . Bedridden 01/26/2018  . Quadriplegia (HCC)   . Abdominal wall abscess 01/25/2018  . Fracture of humerus anatomical neck, right, closed, initial encounter 10/03/2017  . AKI (acute kidney injury) (HCC) 10/03/2017  . UTI (urinary tract infection) 10/03/2017  . Agitation 07/03/2014  . Cardiomyopathy, ischemic 06/18/2014  .  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 Acute psychosis (HCC) 04/19/2014  . Unspecified hypothyroidism 05/16/2013  . Constipation 05/16/2013  . Anemia of other chronic disease 05/16/2013  . Mental disorder 01/03/2013  . Addisons disease (HCC) 01/03/2013  . PEG (percutaneous endoscopic gastrostomy) adjustment/replacement/removal (HCC) 12/21/2011  . Hypokalemia 11/25/2011  . Thrombocytopenia (HCC) 11/25/2011  . FTT (failure to thrive) in adult 11/25/2011  . Grand mal seizure (HCC) 11/23/2011  . Depression (emotion) 11/23/2011  . History of atrial fibrillation 11/23/2011  . Other specified hemorrhagic conditions (HCC) 11/23/2011  . Glaucoma 11/23/2011  . Hypoglycemia 11/23/2011  . Chronic constipation 11/23/2011  . Anoxic brain injury (HCC) 11/12/2011  . Protein calorie malnutrition (HCC) 11/12/2011  . Hyperchloremia 11/12/2011  . Hyperthyroidism 11/12/2011  . Acute lower UTI 11/12/2011  . Dysphagia     Past Surgical History:  Procedure Laterality Date  . DEBRIDEMENT OF ABDOMINAL WALL ABSCESS  01/26/2018  . HERNIA MESH REMOVAL  01/26/2018   Infected mesh - removed w I&D abd wall abscess  . IR GENERIC HISTORICAL  10/20/2016   IR REPLC GASTRO/COLONIC TUBE PERCUT W/FLUORO 10/20/2016 Oley Balm, MD [facility]INTER[facility]  . IR REPLACE G-TUBE SIMPLE WO FLUORO  01/28/2018  . IR REPLC GASTRO/COLONIC TUBE PERCUT W/FLUORO  04/21/2017  . IR REPLC GASTRO/COLONIC TUBE PERCUT W/FLUORO  07/22/2017  . IR REPLC GASTRO/COLONIC TUBE PERCUT W/FLUORO  11/24/2017  . IR REPLC GASTRO/COLONIC TUBE PERCUT W/FLUORO  08/12/2020  . IRRIGATION AND DEBRIDEMENT ABSCESS N/A 01/26/2018   Procedure: IRRIGATION AND DEBRIDEMENT ABDOMINAL WALL ABSCESS WITH REMOVAL OF INFECTED MESH;  Surgeon: Karie SodaGross, Steven, MD;  Location: [facility];  Service: General;  Laterality: N/A;  . NEPHRECTOMY  unknown  . PEG TUBE PLACEMENT         History reviewed. No pertinent family history.  Social History   Tobacco Use  . Smoking status: Never  Smoker  . Smokeless tobacco: Never Used  Vaping Use  . Vaping Use: Never used  Substance Use Topics  . Alcohol use: No  . Drug use: No    Home Medications Prior to Admission medications   Medication Sig Start Date End Date Taking? Authorizing Provider  acetaminophen (TYLENOL) 325 MG tablet Place 650 mg into feeding tube every 4 (four) hours as needed for mild pain.    [provider]  Amino Acids-Protein Hydrolys (FEEDING SUPPLEMENT, PRO-STAT SUGAR FREE 64,) LIQD Place 30 mLs into feeding tube 2 (two) times daily.     [provider]  aspirin 81 MG chewable tablet Chew 81 mg by mouth daily.    [provider]  Carboxymeth-Glycerin-Polysorb (REFRESH OPTIVE ADVANCED OP) Place 1 drop into both eyes 3 (three) times daily.    [provider]  clonazePAM (KLONOPIN) 0.5 MG tablet Take 0.5 mg by mouth at bedtime.    [provider]  diclofenac Sodium (VOLTAREN) 1 % GEL Apply 4 g topically 4 (four) times daily as needed. 05/29/20   Hilts, Casimiro NeedleMichael, MD  divalproex (DEPAKOTE SPRINKLE) 125 MG capsule Take 500 mg by mouth 2 (two) times daily.  07/18/13   [provider]  levothyroxine (SYNTHROID, LEVOTHROID) 150 MCG tablet Place 150 mcg into feeding tube daily before breakfast.     [provider]  metoprolol tartrate (LOPRESSOR) 25 MG tablet Take 12.5 mg by mouth 2 (two) times daily.    [provider]  Multiple Vitamins-Minerals (CERTAVITE/ANTIOXIDANTS) LIQD 15 mLs by PEG Tube route daily at 12 noon.     [provider]  pantoprazole sodium (PROTONIX) 40 mg/20 mL PACK 40 mg by PEG Tube route daily at 12 noon.  11/30/11   Alinda Moneyavis, Novlet J, MD  polyethylene glycol (MIRALAX / GLYCOLAX) packet 17 g by PEG Tube route 2 (two) times daily. Mix 17gm  In 4-8 ounces of liquid    [provider]  polyvinyl alcohol (LIQUIFILM TEARS) 1.4 % ophthalmic solution Place 1 drop into both eyes 2 (two) times daily.    [provider]  risperiDONE (RISPERDAL M-TABS) 1 MG disintegrating tablet Place 1 mg under the tongue 3 (three) times daily.  05/08/17   [provider]  sennosides (SENOKOT) 8.8 MG/5ML syrup Place 15 mLs into feeding tube 2 (two) times daily.     [provider]  traZODone (DESYREL) 150 MG tablet Take 75 mg by mouth at bedtime.  08/02/13   [provider]    Allergies    Ativan [lorazepam], Latex, Morphine and related, Penicillins, Pyridium [phenazopyridine hcl], and Soy allergy  Review of Systems   Review of Systems  Unable to perform ROS: Dementia  Gastrointestinal: Positive for abdominal pain.    Physical Exam Updated Vital Signs BP 101/72   Pulse 88   Temp 98.8 F (37.1 C) (Oral)   Ht 5\' 9"  (1.753 m)   Wt 90 kg   SpO2 96%   BMI 29.30 kg/m   Physical Exam Vitals and nursing note reviewed.   54 year  old male, resting comfortably and in no acute distress. Vital signs are normal. Oxygen saturation is 96%, which is normal. Head is normocephalic.  Nose appears swollen but there is no crepitus.  There is a laceration on the left side of the bridge of the nose.  There is no epistaxis. PERRLA, EOMI. Oropharynx is clear. Neck is nontender without adenopathy or JVD. Back is nontender and there is no CVA tenderness. Lungs are clear without rales, wheezes, or rhonchi. Chest is nontender. Heart has regular rate and rhythm without murmur. Abdomen is soft, flat.  PEG tube is present in the epigastric area.  No evidence of any trauma to the PEG tube insertion site.  No evidence of any bleeding.  Abdomen is soft even to deep palpation without apparent tenderness. Extremities have no cyanosis or edema. Skin is warm and dry without rash. Neurologic: Awake but nonverbal.  ED Results / Procedures / Treatments    Radiology CT Head Wo Contrast  Result Date: 01/22/2021 CLINICAL DATA:  Unwitnessed fall in nursing home. Cut to the nose with bruising under the left eye. History of  seizures. EXAM: CT HEAD WITHOUT CONTRAST CT MAXILLOFACIAL WITHOUT CONTRAST CT CERVICAL SPINE WITHOUT CONTRAST TECHNIQUE: Multidetector CT imaging of the head, cervical spine, and maxillofacial structures were performed using the standard protocol without intravenous contrast. Multiplanar CT image reconstructions of the cervical spine and maxillofacial structures were also generated. COMPARISON:  01/08/2021 FINDINGS: CT HEAD FINDINGS Brain: No evidence of acute infarction, hemorrhage, hydrocephalus, extra-axial collection or mass lesion/mass effect. Mild cerebral atrophy. Mild ventricular dilatation consistent with central atrophy. Cavum septum pellucidum. Vascular: Moderate intracranial arterial vascular calcifications. Skull: Calvarium appears intact. No acute depressed skull fractures. Other: None. CT MAXILLOFACIAL FINDINGS Osseous: Old nasal bone deformities.  No evidence of acute fracture. Orbits: Negative. No traumatic or inflammatory finding. Sinuses: Retention cyst in the right maxillary antrum. Paranasal sinuses and mastoid air cells are otherwise clear. Soft tissues: Mild soft tissue swelling underneath the left orbit. No significant hematoma. CT CERVICAL SPINE FINDINGS Alignment: Straightening of usual cervical lordosis without anterior subluxation. Normal alignment of the facet joints. This is likely positional or degenerative but muscle spasm could also have this appearance. Skull base and vertebrae: Skull base appears intact. No vertebral compression deformities. No focal bone lesion or bone destruction. Soft tissues and spinal canal: No prevertebral soft tissue swelling. No abnormal paraspinal soft tissue mass or infiltration. Disc levels: Diffuse degenerative change with narrowed interspaces and endplate hypertrophic changes throughout. Degenerative changes in the facet joints. Facet joint and uncovertebral spurring causes some bone encroachment upon the neural foramina bilaterally. Upper chest:  Motion artifact limits examination but there appears to be infiltration or edema in the right lung apex. Other: None. IMPRESSION: 1. No acute intracranial abnormalities. Mild chronic atrophy. 2. No acute displaced orbital or facial fractures identified. Mild soft tissue swelling underneath the left orbit. 3. Nonspecific straightening of usual cervical lordosis. Diffuse degenerative changes. No acute displaced fractures identified. 4. Infiltration or edema in the right lung apex. Electronically Signed   By: Burman NievesWilliam  Stevens M.D.   On: 01/22/2021 01:47   CT Cervical Spine Wo Contrast  Result Date: 01/22/2021 CLINICAL DATA:  Unwitnessed fall in nursing home. Cut to the nose with bruising under the left eye. History of seizures. EXAM: CT HEAD WITHOUT CONTRAST CT MAXILLOFACIAL WITHOUT CONTRAST CT CERVICAL SPINE WITHOUT CONTRAST TECHNIQUE: Multidetector CT imaging of the head, cervical spine, and maxillofacial structures were performed using the standard protocol without intravenous contrast. Multiplanar  CT image reconstructions of the cervical spine and maxillofacial structures were also generated. COMPARISON:  01/08/2021 FINDINGS: CT HEAD FINDINGS Brain: No evidence of acute infarction, hemorrhage, hydrocephalus, extra-axial collection or mass lesion/mass effect. Mild cerebral atrophy. Mild ventricular dilatation consistent with central atrophy. Cavum septum pellucidum. Vascular: Moderate intracranial arterial vascular calcifications. Skull: Calvarium appears intact. No acute depressed skull fractures. Other: None. CT MAXILLOFACIAL FINDINGS Osseous: Old nasal bone deformities.  No evidence of acute fracture. Orbits: Negative. No traumatic or inflammatory finding. Sinuses: Retention cyst in the right maxillary antrum. Paranasal sinuses and mastoid air cells are otherwise clear. Soft tissues: Mild soft tissue swelling underneath the left orbit. No significant hematoma. CT CERVICAL SPINE FINDINGS Alignment:  Straightening of usual cervical lordosis without anterior subluxation. Normal alignment of the facet joints. This is likely positional or degenerative but muscle spasm could also have this appearance. Skull base and vertebrae: Skull base appears intact. No vertebral compression deformities. No focal bone lesion or bone destruction. Soft tissues and spinal canal: No prevertebral soft tissue swelling. No abnormal paraspinal soft tissue mass or infiltration. Disc levels: Diffuse degenerative change with narrowed interspaces and endplate hypertrophic changes throughout. Degenerative changes in the facet joints. Facet joint and uncovertebral spurring causes some bone encroachment upon the neural foramina bilaterally. Upper chest: Motion artifact limits examination but there appears to be infiltration or edema in the right lung apex. Other: None. IMPRESSION: 1. No acute intracranial abnormalities. Mild chronic atrophy. 2. No acute displaced orbital or facial fractures identified. Mild soft tissue swelling underneath the left orbit. 3. Nonspecific straightening of usual cervical lordosis. Diffuse degenerative changes. No acute displaced fractures identified. 4. Infiltration or edema in the right lung apex. Electronically Signed   By: Burman Nieves M.D.   On: 01/22/2021 01:47   CT Maxillofacial Wo Contrast  Result Date: 01/22/2021 CLINICAL DATA:  Unwitnessed fall in nursing home. Cut to the nose with bruising under the left eye. History of seizures. EXAM: CT HEAD WITHOUT CONTRAST CT MAXILLOFACIAL WITHOUT CONTRAST CT CERVICAL SPINE WITHOUT CONTRAST TECHNIQUE: Multidetector CT imaging of the head, cervical spine, and maxillofacial structures were performed using the standard protocol without intravenous contrast. Multiplanar CT image reconstructions of the cervical spine and maxillofacial structures were also generated. COMPARISON:  01/08/2021 FINDINGS: CT HEAD FINDINGS Brain: No evidence of acute infarction,  hemorrhage, hydrocephalus, extra-axial collection or mass lesion/mass effect. Mild cerebral atrophy. Mild ventricular dilatation consistent with central atrophy. Cavum septum pellucidum. Vascular: Moderate intracranial arterial vascular calcifications. Skull: Calvarium appears intact. No acute depressed skull fractures. Other: None. CT MAXILLOFACIAL FINDINGS Osseous: Old nasal bone deformities.  No evidence of acute fracture. Orbits: Negative. No traumatic or inflammatory finding. Sinuses: Retention cyst in the right maxillary antrum. Paranasal sinuses and mastoid air cells are otherwise clear. Soft tissues: Mild soft tissue swelling underneath the left orbit. No significant hematoma. CT CERVICAL SPINE FINDINGS Alignment: Straightening of usual cervical lordosis without anterior subluxation. Normal alignment of the facet joints. This is likely positional or degenerative but muscle spasm could also have this appearance. Skull base and vertebrae: Skull base appears intact. No vertebral compression deformities. No focal bone lesion or bone destruction. Soft tissues and spinal canal: No prevertebral soft tissue swelling. No abnormal paraspinal soft tissue mass or infiltration. Disc levels: Diffuse degenerative change with narrowed interspaces and endplate hypertrophic changes throughout. Degenerative changes in the facet joints. Facet joint and uncovertebral spurring causes some bone encroachment upon the neural foramina bilaterally. Upper chest: Motion artifact limits examination but there appears to  be infiltration or edema in the right lung apex. Other: None. IMPRESSION: 1. No acute intracranial abnormalities. Mild chronic atrophy. 2. No acute displaced orbital or facial fractures identified. Mild soft tissue swelling underneath the left orbit. 3. Nonspecific straightening of usual cervical lordosis. Diffuse degenerative changes. No acute displaced fractures identified. 4. Infiltration or edema in the right lung  apex. Electronically Signed   By: Burman Nieves M.D.   On: 01/22/2021 01:47    Procedures .Marland KitchenLaceration Repair  Date/Time: 01/21/2021 11:55 PM Performed by: Dione Booze, MD Authorized by: Dione Booze, MD   Consent:    Consent obtained: Implied consent.   Risks, benefits, and alternatives were discussed: not applicable   Universal protocol:    Required blood products, implants, devices, and special equipment available: yes     Site/side marked: yes     Immediately prior to procedure, a time out was called: yes     Patient identity confirmed:  Hospital-assigned identification number and arm band Anesthesia:    Anesthesia method:  None Laceration details:    Location:  Face   Face location:  Nose   Length (cm):  1   Depth (mm):  1 Pre-procedure details:    Preparation:  Patient was prepped and draped in usual sterile fashion Exploration:    Limited defect created (wound extended): no     Hemostasis achieved with:  Direct pressure   Wound extent: no foreign bodies/material noted     Contaminated: no   Treatment:    Area cleansed with:  Saline   Amount of cleaning:  Standard   Debridement:  None   Undermining:  None   Scar revision: no   Skin repair:    Repair method:  Tissue adhesive Approximation:    Approximation:  Close Repair type:    Repair type:  Simple Post-procedure details:    Dressing:  Open (no dressing)   Procedure completion:  Tolerated well, no immediate complications     Medications Ordered in ED Medications - No data to display  ED Course  I have reviewed the triage vital signs and the nursing notes.  Pertinent imaging results that were available during my care of the patient were reviewed by me and considered in my medical decision making (see chart for details).  MDM Rules/Calculators/A&P Unwitnessed fall with laceration to the nose which is closed with tissue adhesive.  He will be sent for CT of head, maxillofacial bones, cervical spine.   No evidence of abdominal injury on my exam, no indication for abdominal imaging.  Old records are reviewed, and he does have prior ED visits for falls.  Tdap booster given February 23, 2020.  CT scans showed no acute injury.  Patient is discharged to return to his skilled nursing facility.  Final Clinical Impression(s) / ED Diagnoses Final diagnoses:  Fall at nursing home, initial encounter  Laceration of nose, initial encounter    Rx / DC Orders ED Discharge Orders    None       Dione Booze, MD 01/22/21 [phone]

## 2021-01-22 ENCOUNTER — Emergency Department (HOSPITAL_COMMUNITY): Payer: Medicare Other

## 2021-01-22 DIAGNOSIS — S0121XA Laceration without foreign body of nose, initial encounter: Secondary | ICD-10-CM | POA: Diagnosis not present

## 2021-01-22 NOTE — ED Notes (Signed)
Report given to [place]  

## 2021-01-22 NOTE — ED Notes (Signed)
[facility] called for transport.  

## 2021-02-10 IMAGING — CR DG HUMERUS 2V *R*
2 series · 2 of 2 positions shown · non-contrast
Comparison: 06/10/2020 and prior.

CLINICAL DATA: fall pain

EXAM:
RIGHT HUMERUS - 2+ VIEW

[x humerus ap right]
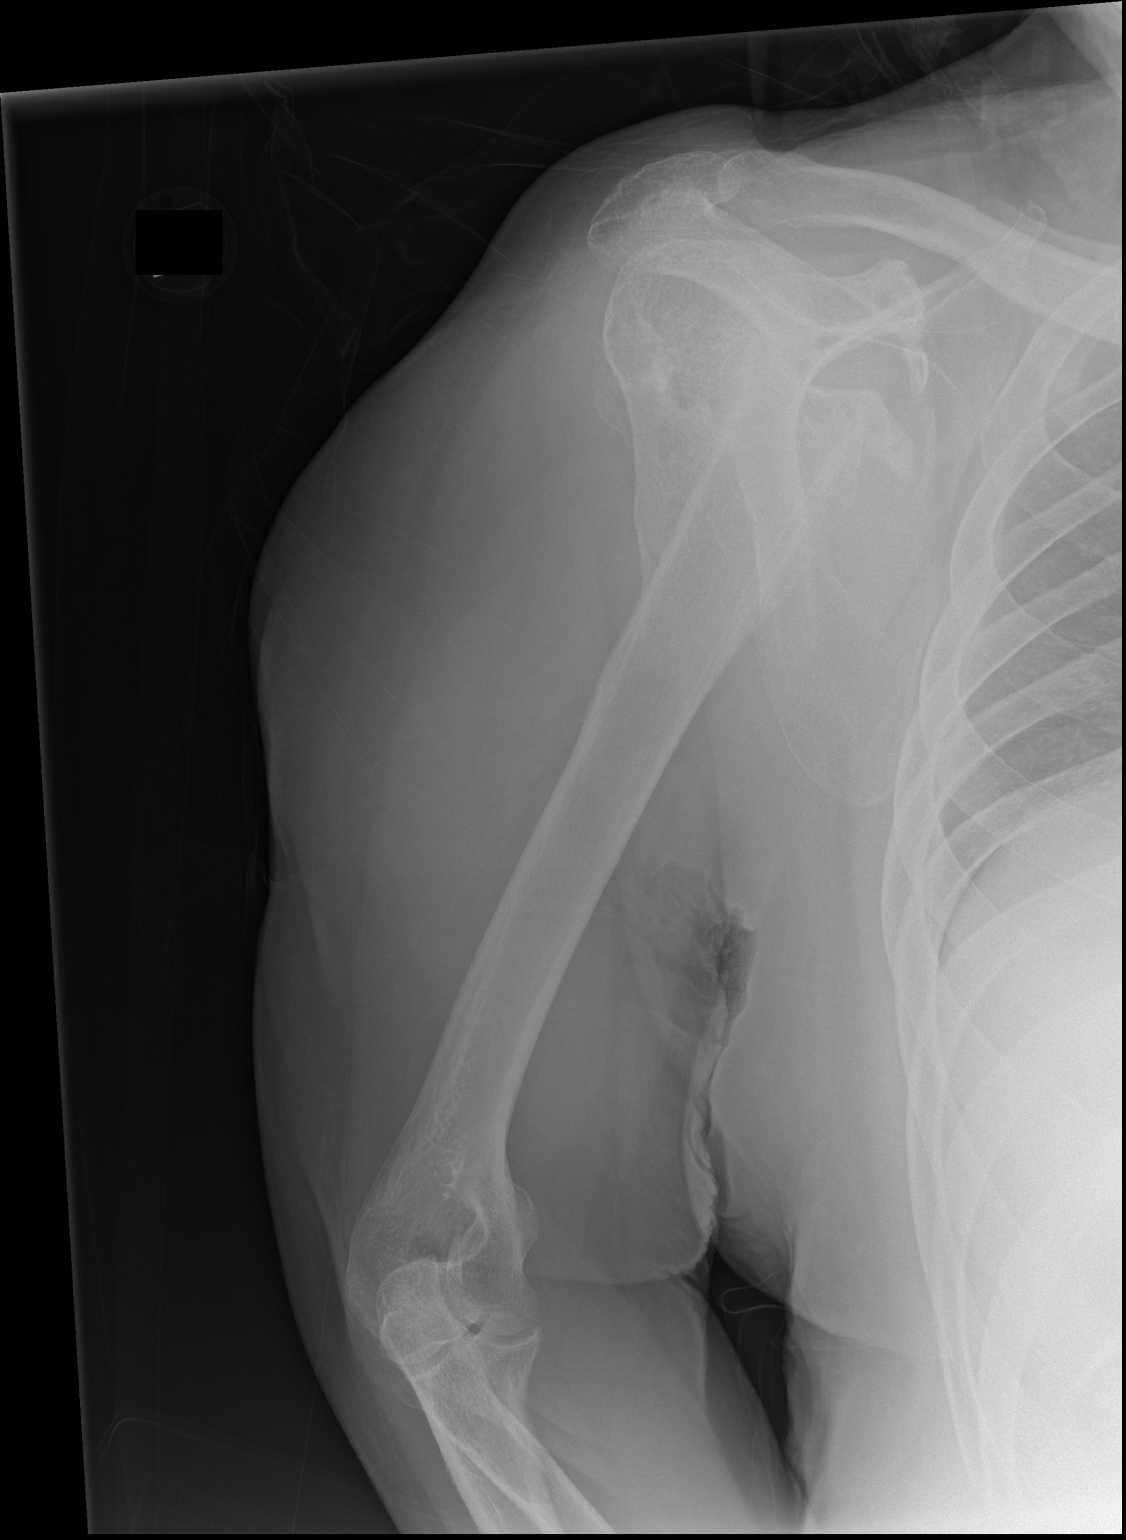

[x humerus lat right]
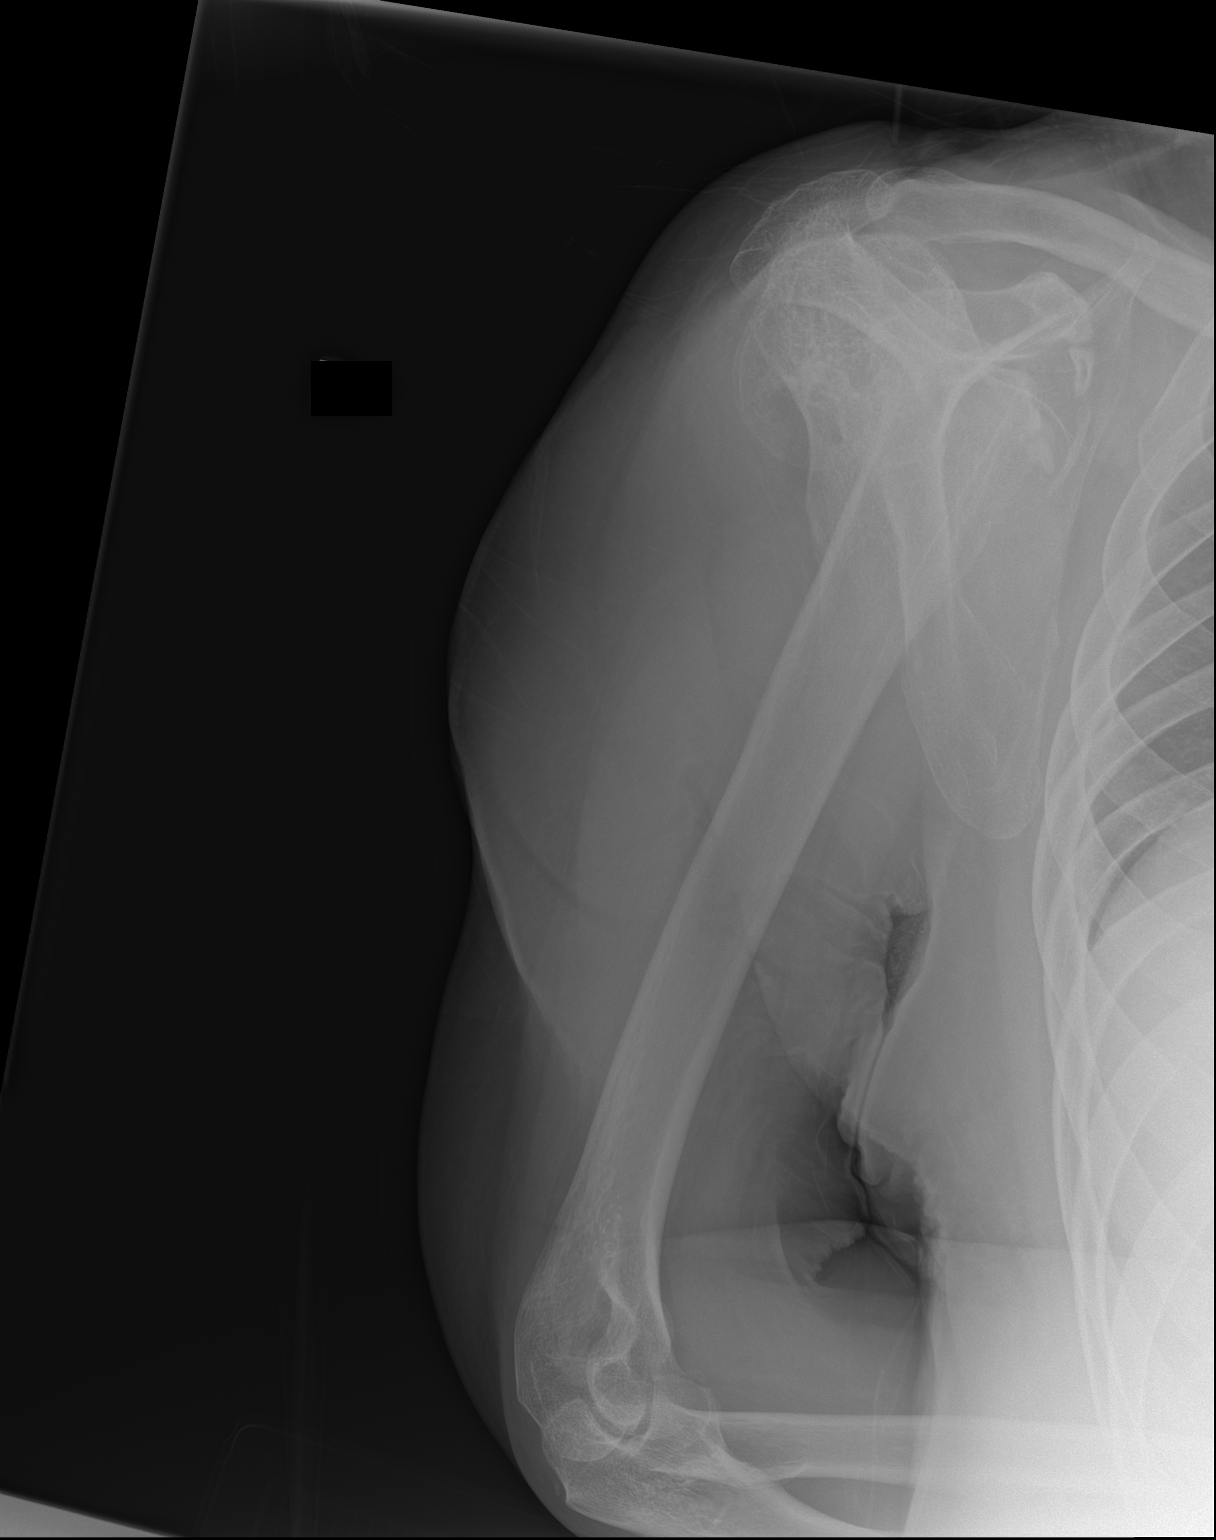

[2 of 2 positions shown; findings below may reference images not displayed]

FINDINGS: Chronic posttraumatic deformity involving the proximal humerus. No
discrete lucency to suggest acute fracture. The shoulder and elbow
joints remain approximated. Acromioclavicular osteoarthrosis. Soft
tissues are within normal limits.
IMPRESSION: Chronic posttraumatic deformity of the proximal right humerus.

No discrete lucency to suggest acute fracture.

## 2021-02-10 IMAGING — CT CT CERVICAL SPINE W/O CM
4 of 11 series · 7 of 33 positions shown, 8 images · non-contrast
Comparison: CT brain 05/22/2020, CT brain and cervical spine
03/04/2020

CLINICAL DATA: Witnessed fall

EXAM:
CT HEAD WITHOUT CONTRAST
CT CERVICAL SPINE WITHOUT CONTRAST
TECHNIQUE: Multidetector CT imaging of the head and cervical spine was
performed following the standard protocol without intravenous
contrast. Multiplanar CT image reconstructions of the cervical spine
were also generated.

[Series 8: c spine soft · axial · 0.40mm/px · z∈[-246,-182]mm · 2 of 96 slices shown]
[im 32/96  soft-tissue]
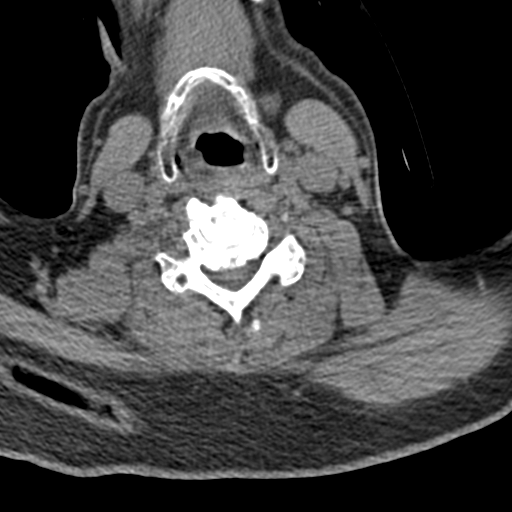
[im 64/96  soft-tissue]
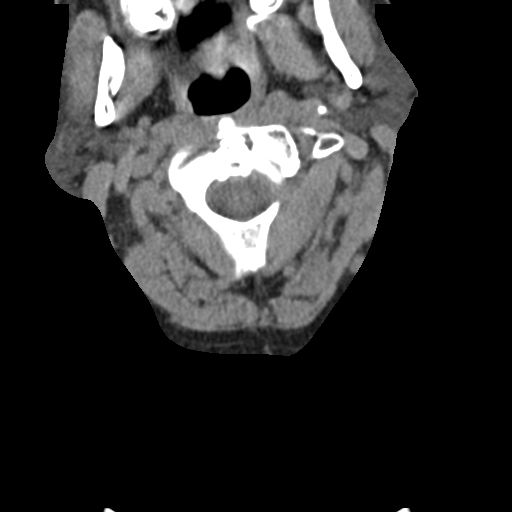

[Series 11: orthogonal bone · axial · 0.25mm/px · z∈[-279,-221]mm · 2 of 97 slices shown, 3 images]
[im 33/97  soft-tissue]
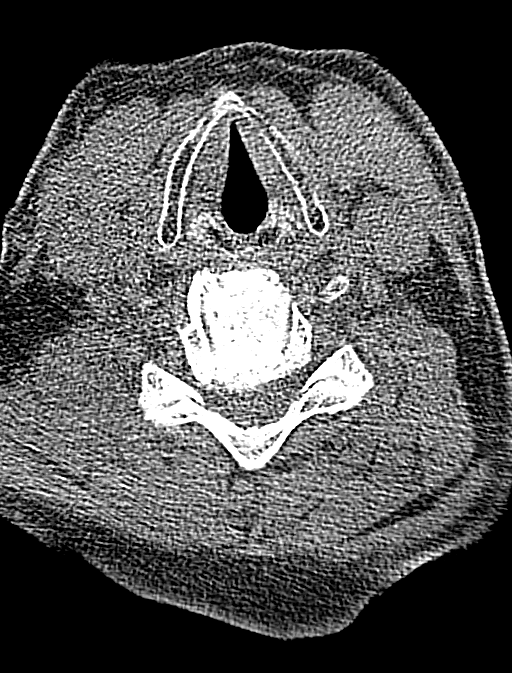
[im 33/97  bone]
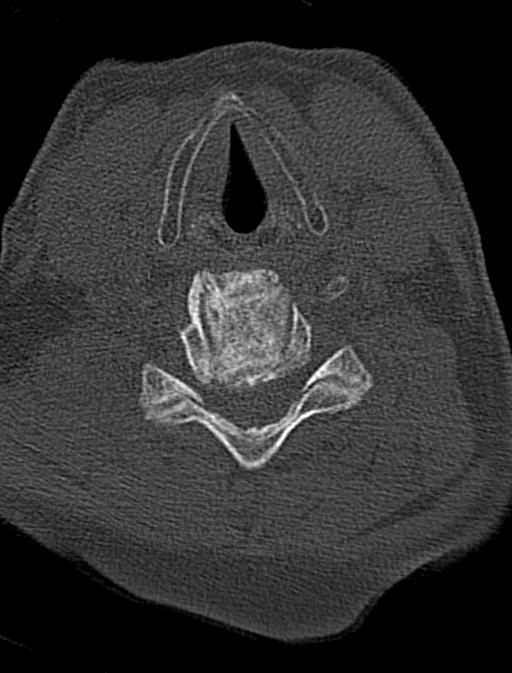
[im 65/97  bone]
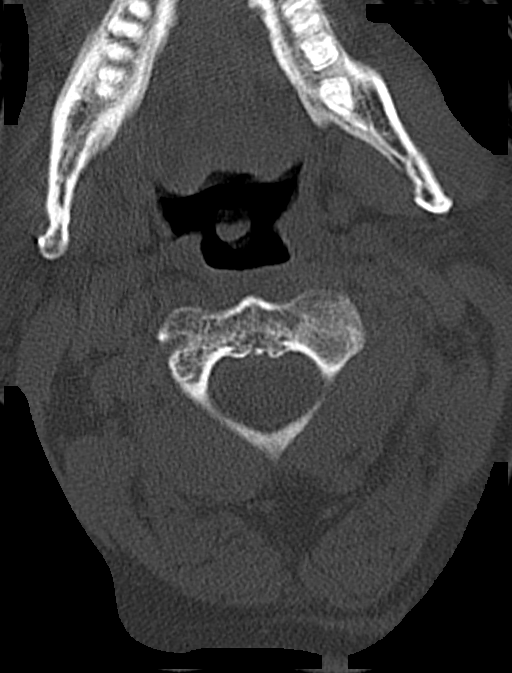

[Series 13: sagittal bone · sagittal · 0.36mm/px · 2 of 61 slices shown]
[im 21/61  bone]
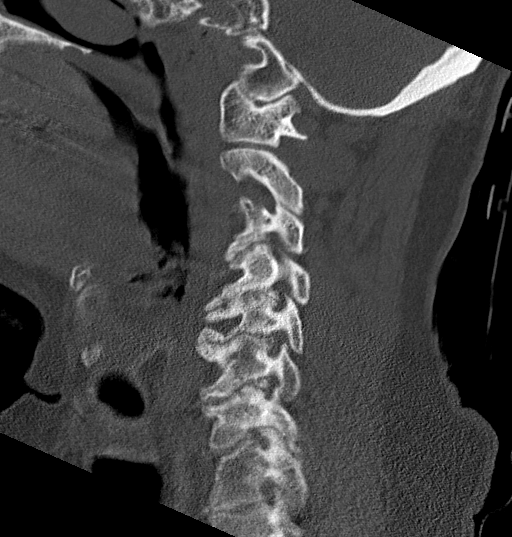
[im 41/61  bone]
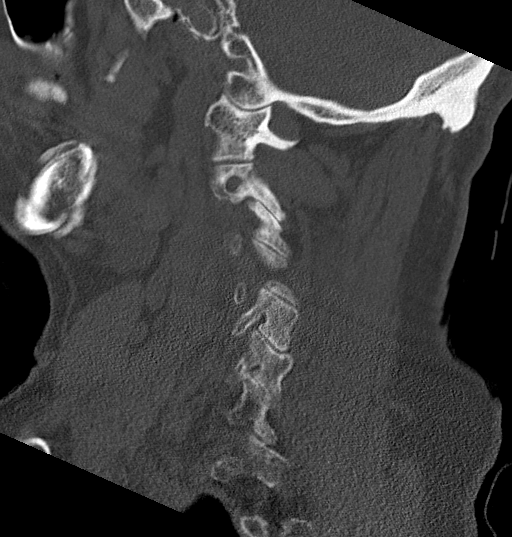

[Series 19: coronal bone · coronal · 0.27mm/px · 1 of 75 slices shown]
[im 38/75  bone]
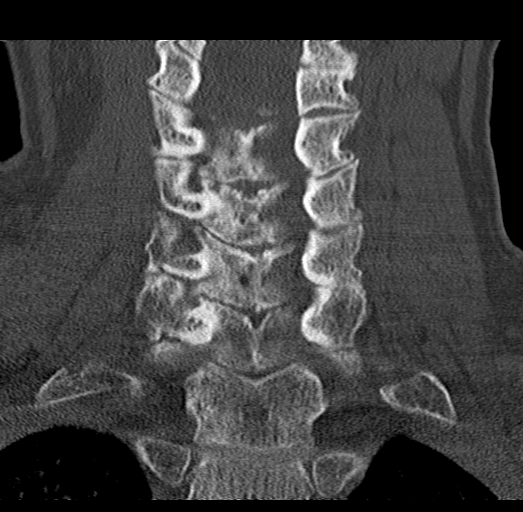

[7 of 33 positions shown; findings below may reference images not displayed]

FINDINGS: CT HEAD FINDINGS

Brain: No acute territorial infarction, hemorrhage, or intracranial
mass. Mild periventricular white matter hypodensity consistent with
chronic small vessel ischemic change. Mild atrophy. Stable ventricle
size.

Vascular: No hyperdense vessels.  Carotid vascular calcification

Skull: Normal. Negative for fracture or focal lesion.

Sinuses/Orbits: No acute finding.  Chronic nasal bone deformity

Other: None

CT CERVICAL SPINE FINDINGS

Alignment: Mild reversal of cervical lordosis. No subluxation. Facet
alignment is maintained.

Skull base and vertebrae: No acute fracture. No primary bone lesion
or focal pathologic process.

Soft tissues and spinal canal: No prevertebral fluid or swelling. No
visible canal hematoma.

Disc levels: Moderate diffuse degenerative changes throughout the
cervical spine with multiple level disc space narrowing and
spurring. Facet degenerative change at multiple levels with
foraminal narrowing at multiple levels, most notable at bilateral
C6-C7 and on the right side at C4-C5.

Upper chest: Negative.

Other: None
IMPRESSION: 1. No CT evidence for acute intracranial abnormality. Atrophy and
mild chronic small vessel ischemic change of the white matter.
2. Reversal of cervical lordosis with degenerative changes. No acute
osseous abnormality.

## 2021-02-24 IMAGING — CT CT HEAD W/O CM
3 series · 14 of 47 positions shown, 16 images · non-contrast
Comparison: 01/08/2021

CLINICAL DATA: Unwitnessed fall in [HOSPITAL]. Cut to the nose
with bruising under the left eye. History of seizures.

EXAM:
CT HEAD WITHOUT CONTRAST
CT MAXILLOFACIAL WITHOUT CONTRAST
CT CERVICAL SPINE WITHOUT CONTRAST
TECHNIQUE: Multidetector CT imaging of the head, cervical spine, and
maxillofacial structures were performed using the standard protocol
without intravenous contrast. Multiplanar CT image reconstructions
of the cervical spine and maxillofacial structures were also
generated.

[Series 3: head wo · axial · 0.47mm/px · z∈[-93,+42]mm · 8 of 33 slices shown, 10 images]
[im 3/33  brain]
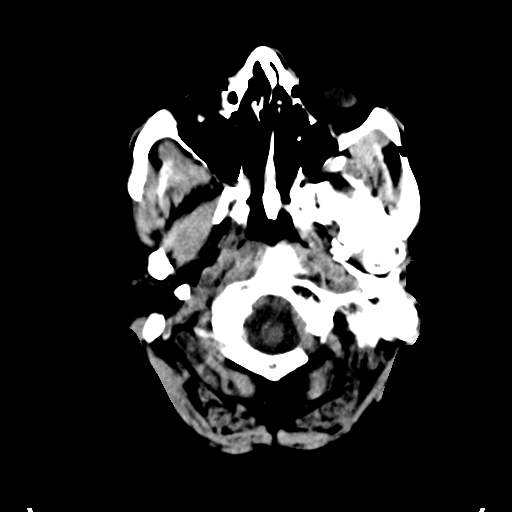
[im 3/33  bone]
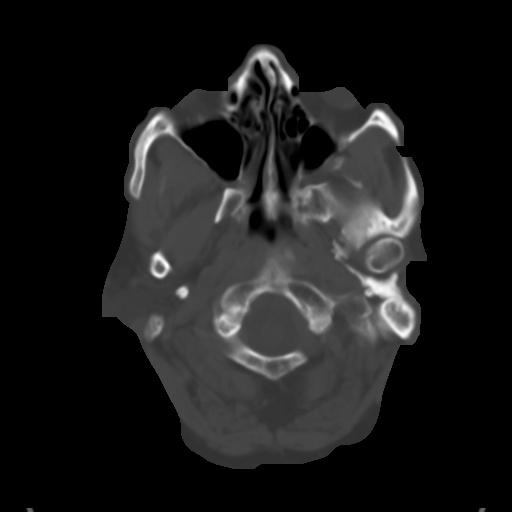
[im 7/33  brain]
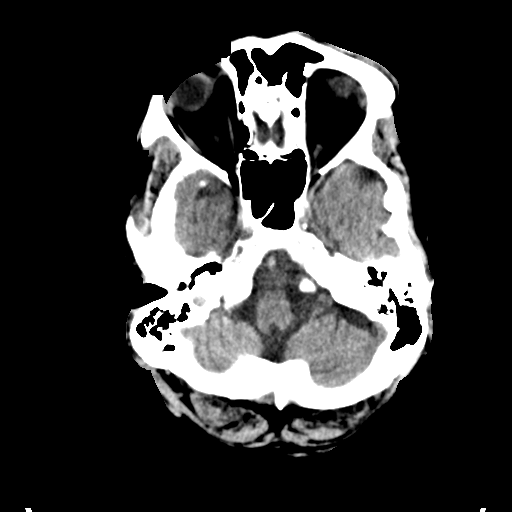
[im 10/33  brain]
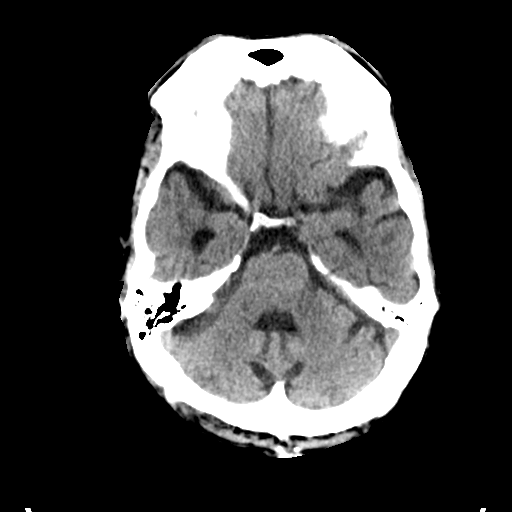
[im 15/33  brain]
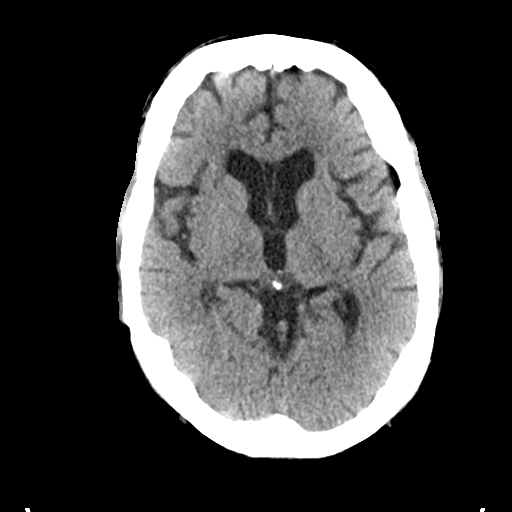
[im 18/33  brain]
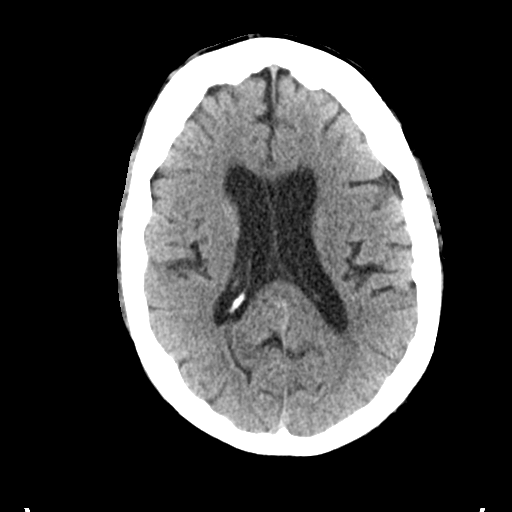
[im 18/33  bone]
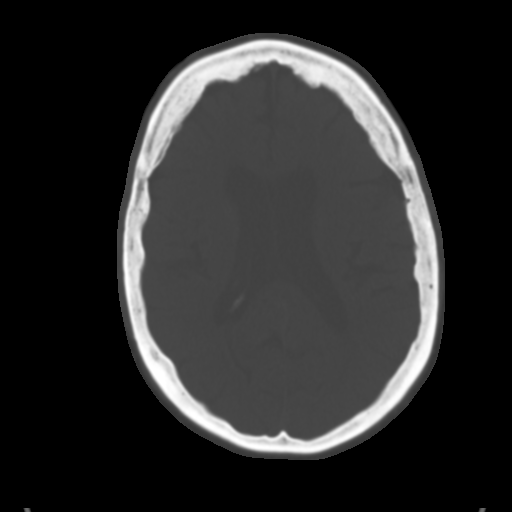
[im 23/33  brain]
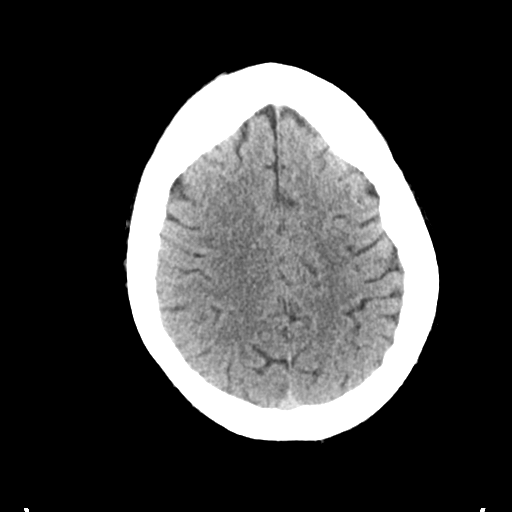
[im 26/33  brain]
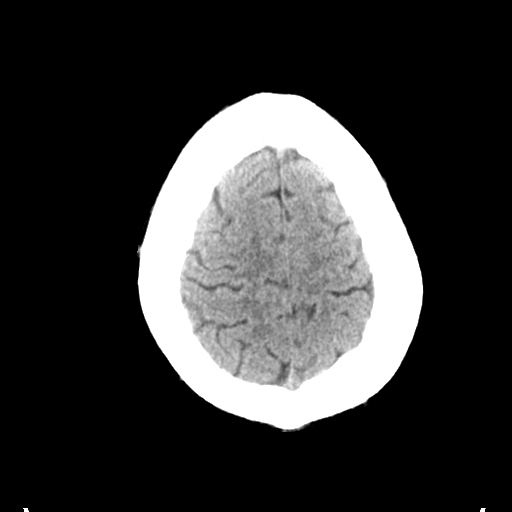
[im 30/33  brain]
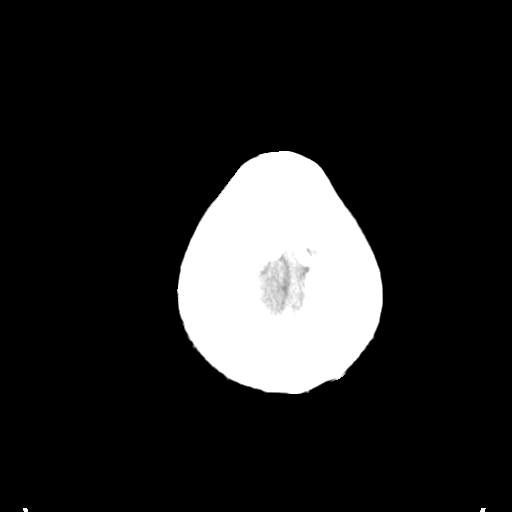

[Series 5: coronal soft tissue · coronal · 0.30mm/px · 3 of 71 slices shown]
[im 24/71  brain]
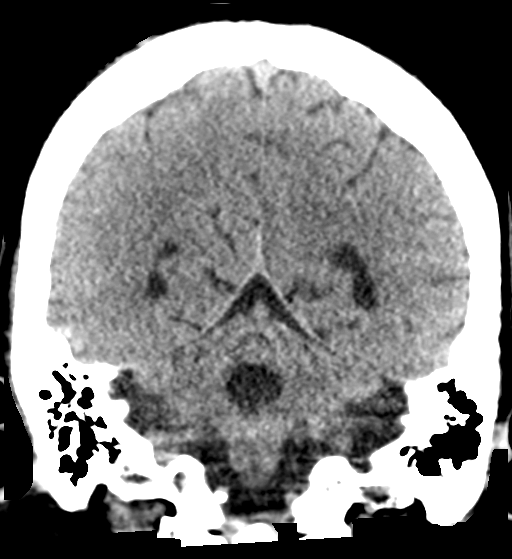
[im 32/71  brain]
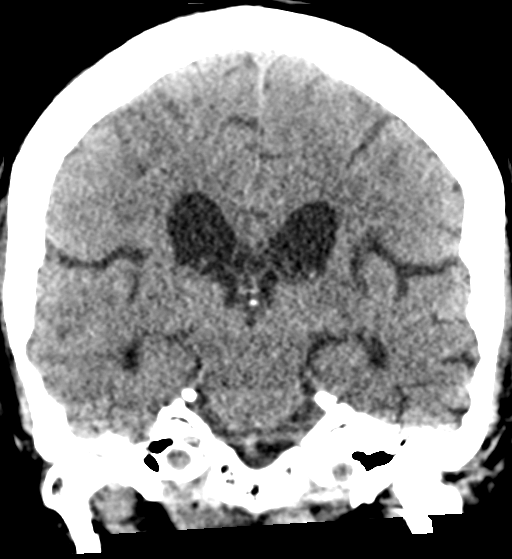
[im 39/71  brain]
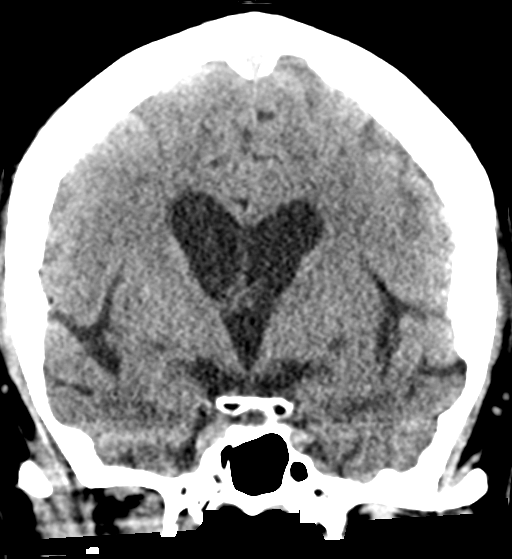

[Series 6: sagittal soft tissue · sagittal · 0.33mm/px · 3 of 53 slices shown]
[im 18/53  brain]
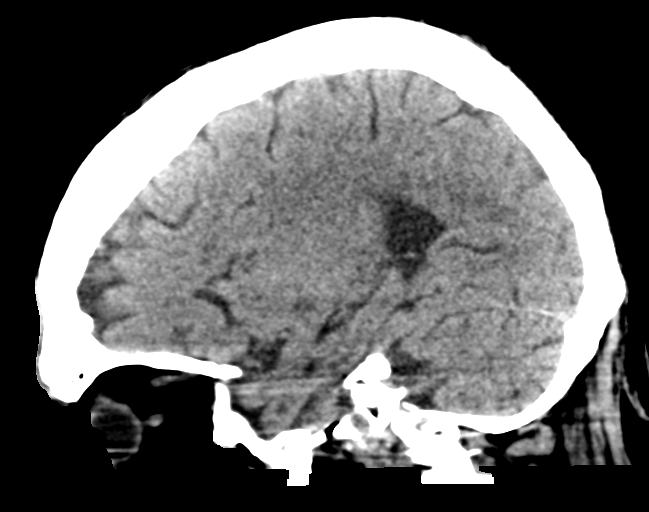
[im 27/53  brain]
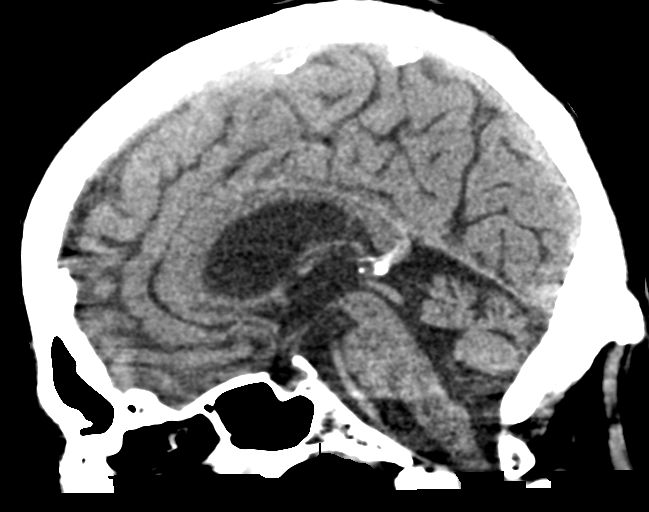
[im 35/53  brain]
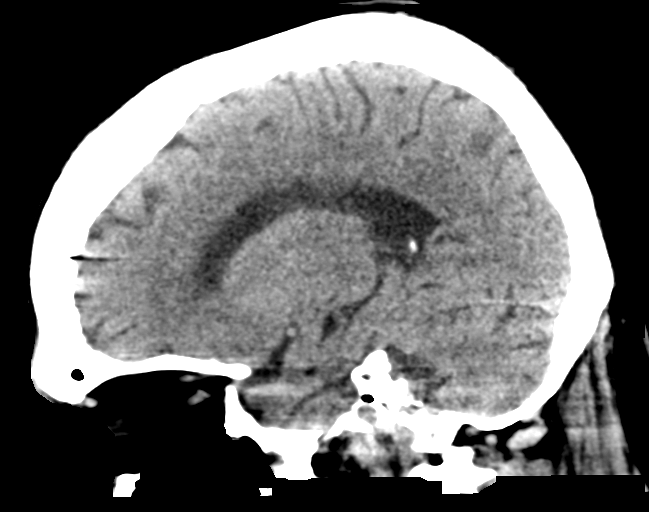

[14 of 47 positions shown; findings below may reference images not displayed]

FINDINGS: CT HEAD FINDINGS

Brain: No evidence of acute infarction, hemorrhage, hydrocephalus,
extra-axial collection or mass lesion/mass effect. Mild cerebral
atrophy. Mild ventricular dilatation consistent with central
atrophy. Cavum septum pellucidum.

Vascular: Moderate intracranial arterial vascular calcifications.

Skull: Calvarium appears intact. No acute depressed skull fractures.

Other: None.

CT MAXILLOFACIAL FINDINGS

Osseous: Old nasal bone deformities.  No evidence of acute fracture.

Orbits: Negative. No traumatic or inflammatory finding.

Sinuses: Retention cyst in the right maxillary antrum. Paranasal
sinuses and mastoid air cells are otherwise clear.

Soft tissues: Mild soft tissue swelling underneath the left orbit.
No significant hematoma.

CT CERVICAL SPINE FINDINGS

Alignment: Straightening of usual cervical lordosis without anterior
subluxation. Normal alignment of the facet joints. This is likely
positional or degenerative but muscle spasm could also have this
appearance.

Skull base and vertebrae: Skull base appears intact. No vertebral
compression deformities. No focal bone lesion or bone destruction.

Soft tissues and spinal canal: No prevertebral soft tissue swelling.
No abnormal paraspinal soft tissue mass or infiltration.

Disc levels: Diffuse degenerative change with narrowed interspaces
and endplate hypertrophic changes throughout. Degenerative changes
in the facet joints. Facet joint and uncovertebral spurring causes
some bone encroachment upon the neural foramina bilaterally.

Upper chest: Motion artifact limits examination but there appears to
be infiltration or edema in the right lung apex.

Other: None.
IMPRESSION: 1. No acute intracranial abnormalities. Mild chronic atrophy.
2. No acute displaced orbital or facial fractures identified. Mild
soft tissue swelling underneath the left orbit.
3. Nonspecific straightening of usual cervical lordosis. Diffuse
degenerative changes. No acute displaced fractures identified.
4. Infiltration or edema in the right lung apex.

## 2021-02-24 IMAGING — CT CT CERVICAL SPINE W/O CM
3 of 4 series · 9 of 33 positions shown, 11 images · non-contrast
Comparison: 01/08/2021

CLINICAL DATA: Unwitnessed fall in [HOSPITAL]. Cut to the nose
with bruising under the left eye. History of seizures.

EXAM:
CT HEAD WITHOUT CONTRAST
CT MAXILLOFACIAL WITHOUT CONTRAST
CT CERVICAL SPINE WITHOUT CONTRAST
TECHNIQUE: Multidetector CT imaging of the head, cervical spine, and
maxillofacial structures were performed using the standard protocol
without intravenous contrast. Multiplanar CT image reconstructions
of the cervical spine and maxillofacial structures were also
generated.

[Series 5: orthogonal axials · axial · 0.21mm/px · z∈[-192,-192]mm · 1 of 93 slices shown, 2 images]
[im 47/93  soft-tissue]
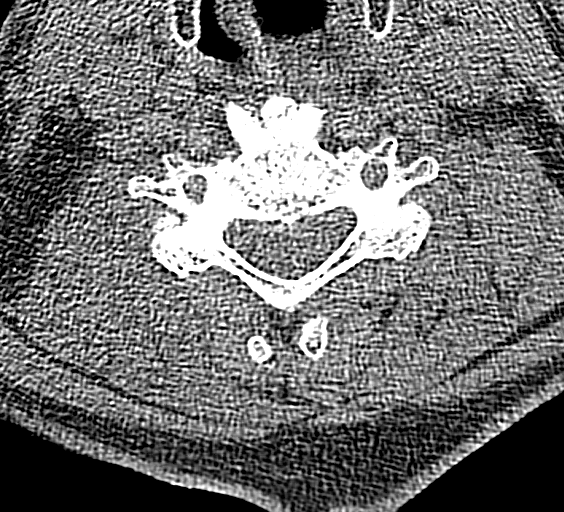
[im 47/93  bone]
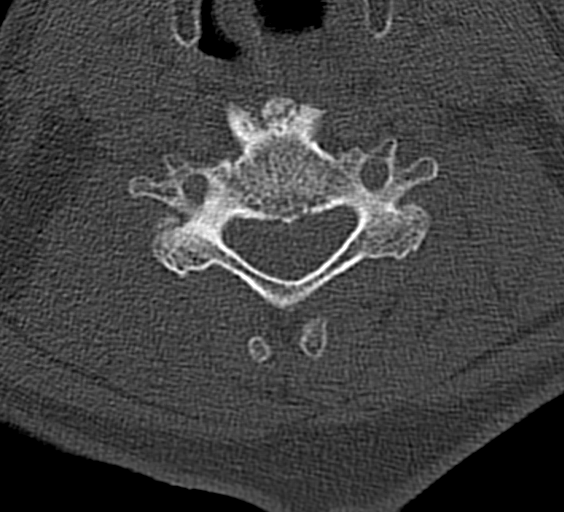

[Series 6: coronal bone · coronal · 0.22mm/px · 3 of 52 slices shown]
[im 11/52  bone]
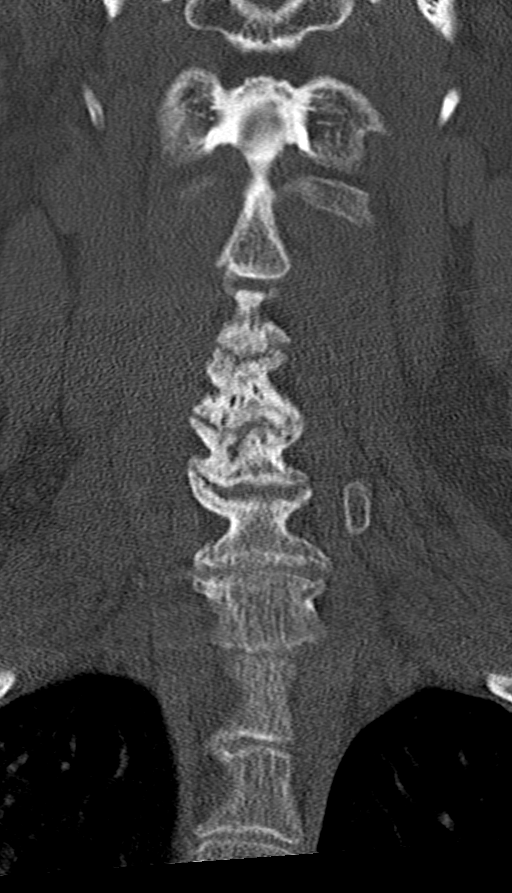
[im 21/52  bone]
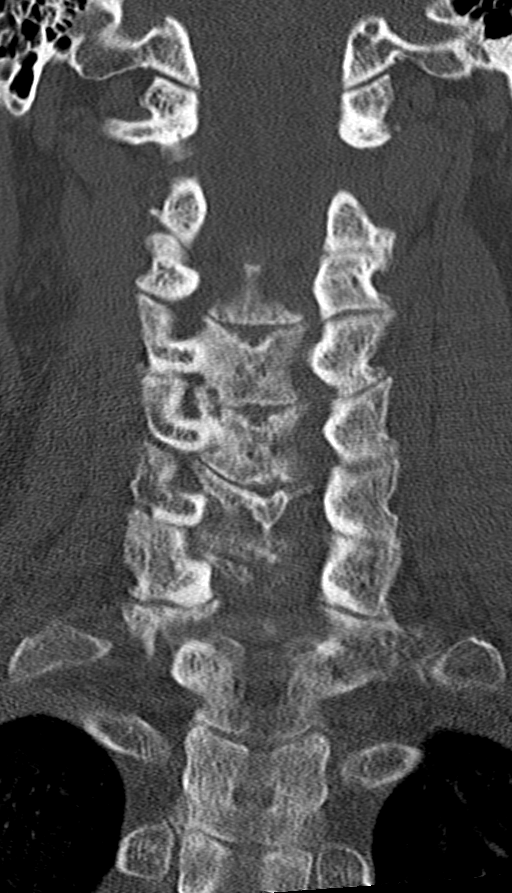
[im 31/52  bone]
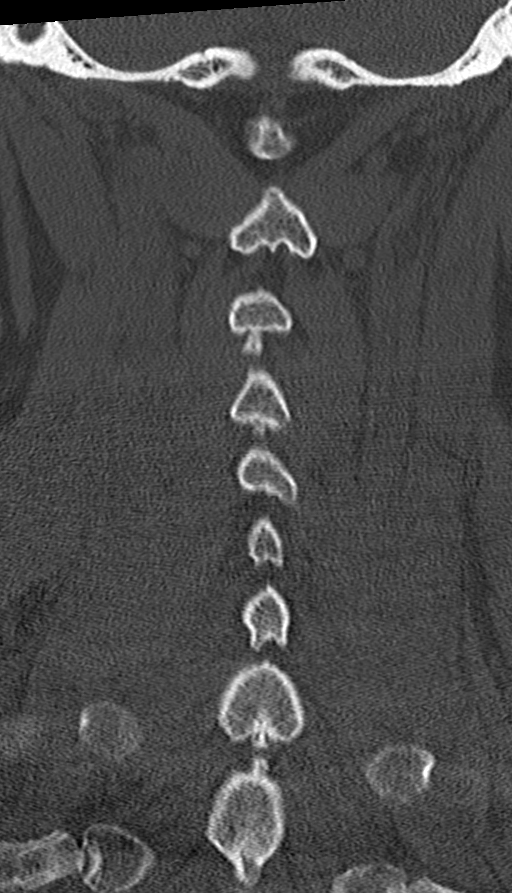

[Series 7: sagittal bone · sagittal · 0.21mm/px · 5 of 54 slices shown, 6 images]
[im 18/54  bone]
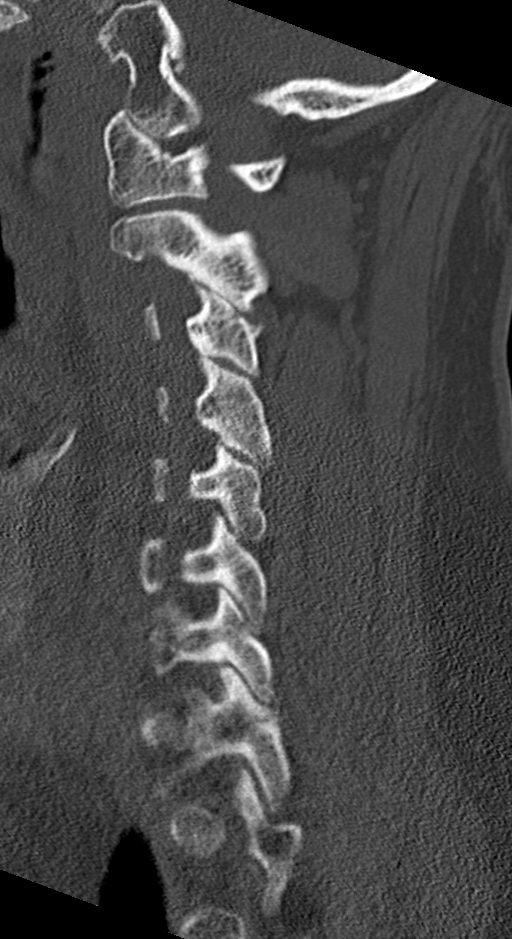
[im 23/54  bone]
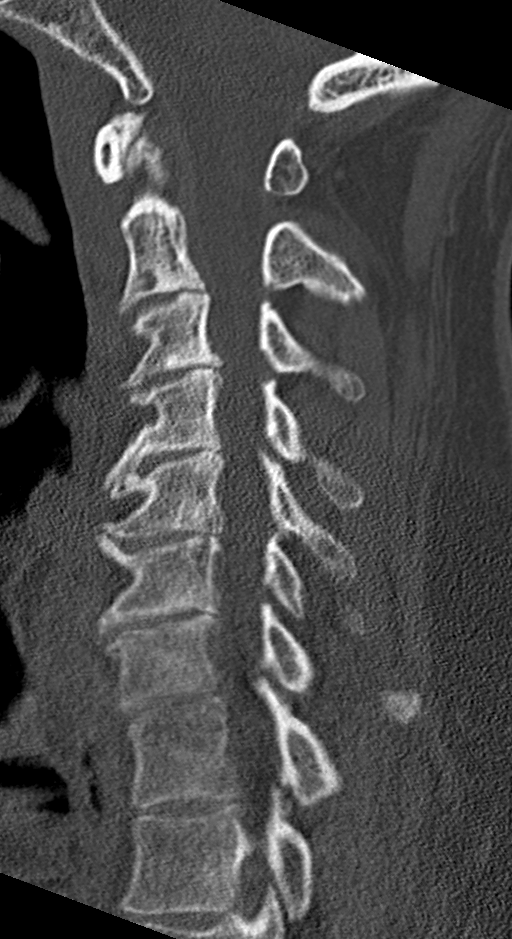
[im 27/54  soft-tissue]
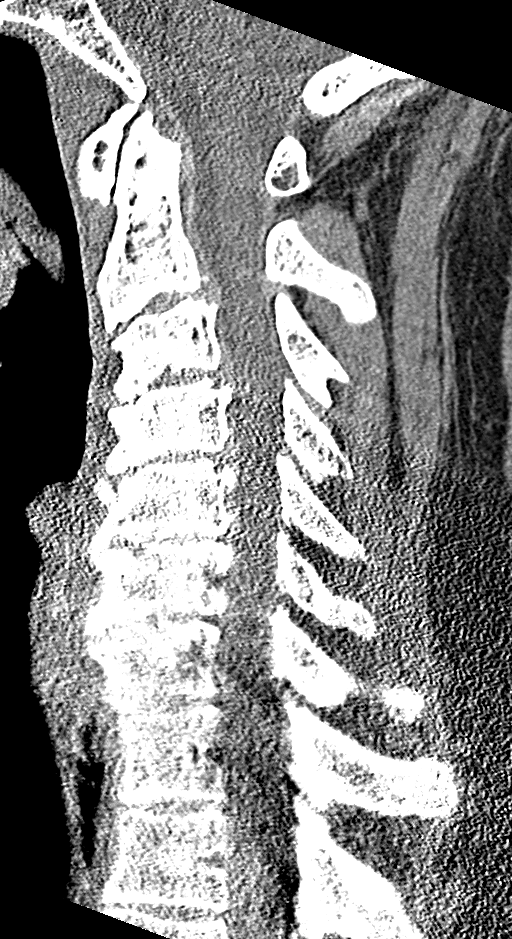
[im 27/54  bone]
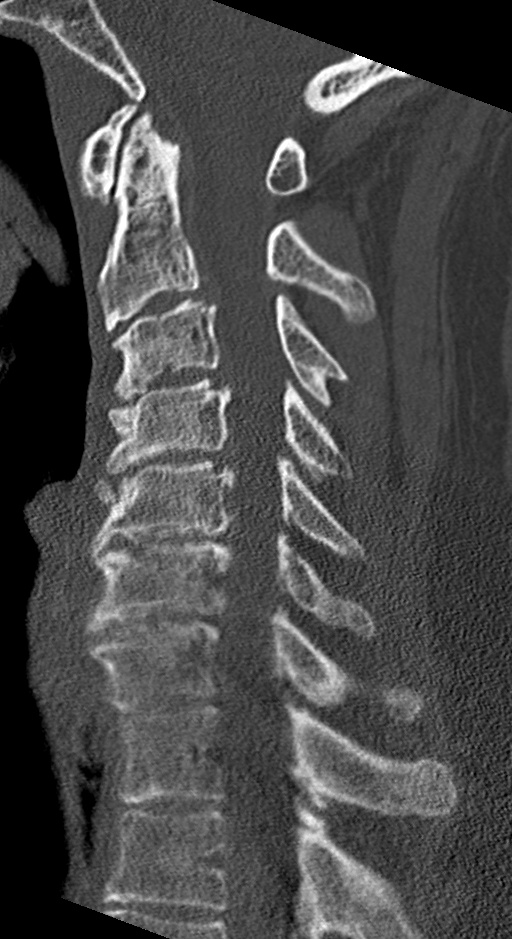
[im 31/54  bone]
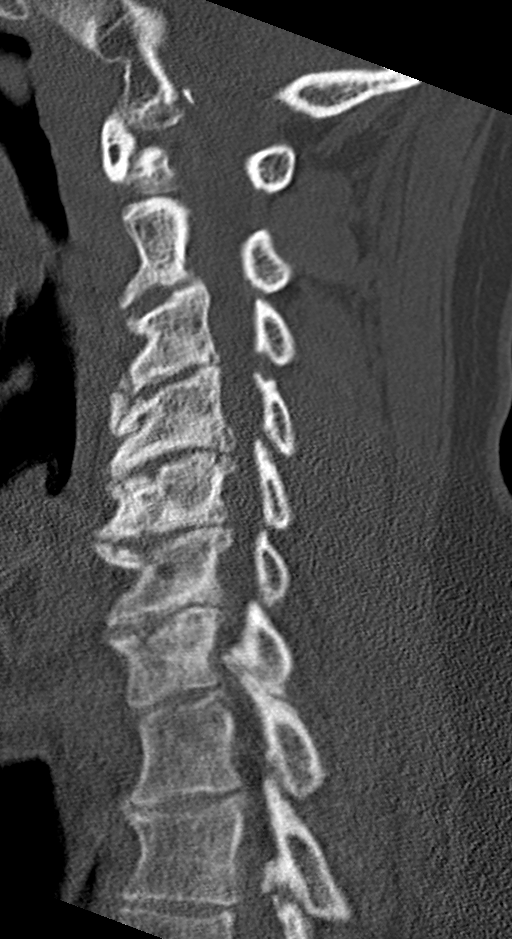
[im 36/54  bone]
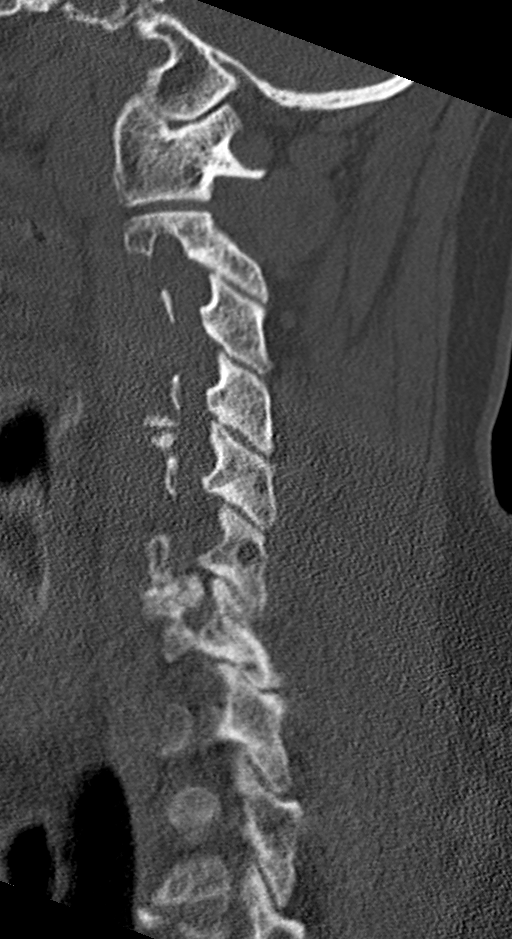

[9 of 33 positions shown; findings below may reference images not displayed]

FINDINGS: CT HEAD FINDINGS

Brain: No evidence of acute infarction, hemorrhage, hydrocephalus,
extra-axial collection or mass lesion/mass effect. Mild cerebral
atrophy. Mild ventricular dilatation consistent with central
atrophy. Cavum septum pellucidum.

Vascular: Moderate intracranial arterial vascular calcifications.

Skull: Calvarium appears intact. No acute depressed skull fractures.

Other: None.

CT MAXILLOFACIAL FINDINGS

Osseous: Old nasal bone deformities.  No evidence of acute fracture.

Orbits: Negative. No traumatic or inflammatory finding.

Sinuses: Retention cyst in the right maxillary antrum. Paranasal
sinuses and mastoid air cells are otherwise clear.

Soft tissues: Mild soft tissue swelling underneath the left orbit.
No significant hematoma.

CT CERVICAL SPINE FINDINGS

Alignment: Straightening of usual cervical lordosis without anterior
subluxation. Normal alignment of the facet joints. This is likely
positional or degenerative but muscle spasm could also have this
appearance.

Skull base and vertebrae: Skull base appears intact. No vertebral
compression deformities. No focal bone lesion or bone destruction.

Soft tissues and spinal canal: No prevertebral soft tissue swelling.
No abnormal paraspinal soft tissue mass or infiltration.

Disc levels: Diffuse degenerative change with narrowed interspaces
and endplate hypertrophic changes throughout. Degenerative changes
in the facet joints. Facet joint and uncovertebral spurring causes
some bone encroachment upon the neural foramina bilaterally.

Upper chest: Motion artifact limits examination but there appears to
be infiltration or edema in the right lung apex.

Other: None.
IMPRESSION: 1. No acute intracranial abnormalities. Mild chronic atrophy.
2. No acute displaced orbital or facial fractures identified. Mild
soft tissue swelling underneath the left orbit.
3. Nonspecific straightening of usual cervical lordosis. Diffuse
degenerative changes. No acute displaced fractures identified.
4. Infiltration or edema in the right lung apex.

## 2021-02-24 IMAGING — CT CT MAXILLOFACIAL W/O CM
3 of 6 series · 13 of 47 positions shown, 15 images · non-contrast
Comparison: 01/08/2021

CLINICAL DATA: Unwitnessed fall in [HOSPITAL]. Cut to the nose
with bruising under the left eye. History of seizures.

EXAM:
CT HEAD WITHOUT CONTRAST
CT MAXILLOFACIAL WITHOUT CONTRAST
CT CERVICAL SPINE WITHOUT CONTRAST
TECHNIQUE: Multidetector CT imaging of the head, cervical spine, and
maxillofacial structures were performed using the standard protocol
without intravenous contrast. Multiplanar CT image reconstructions
of the cervical spine and maxillofacial structures were also
generated.

[Series 3: max soft · axial · 0.36mm/px · z∈[-200,-70]mm · 8 of 83 slices shown, 10 images]
[im 9/83  brain]
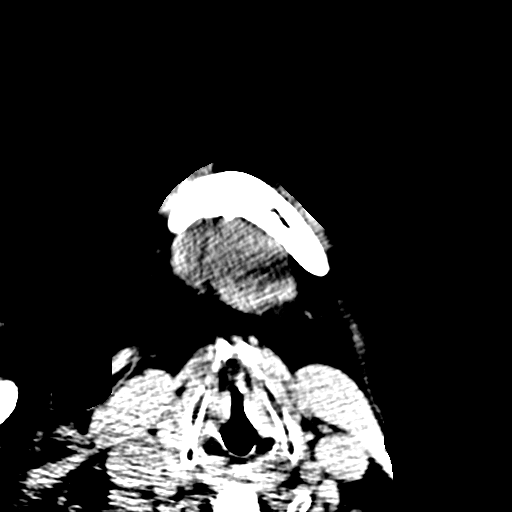
[im 9/83  bone]
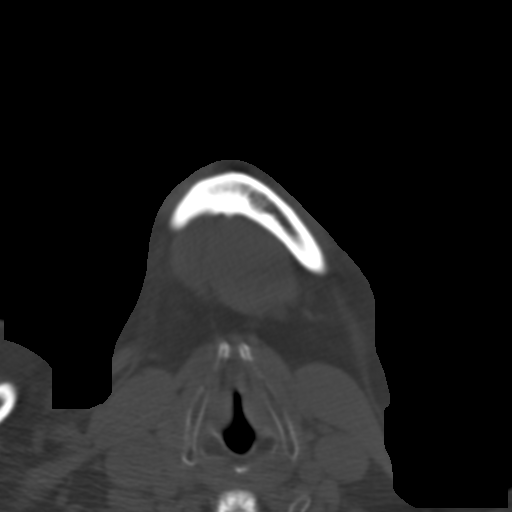
[im 17/83  bone]
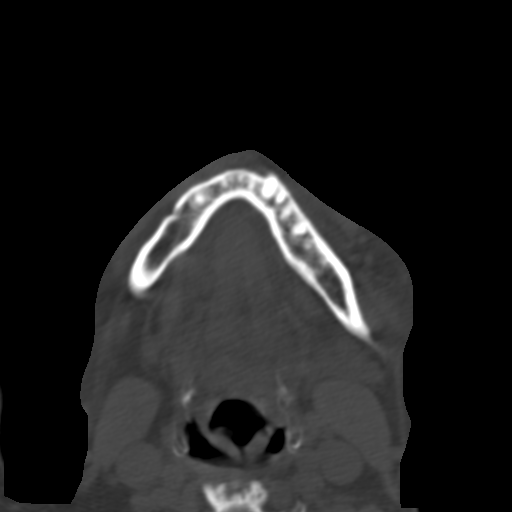
[im 25/83  bone]
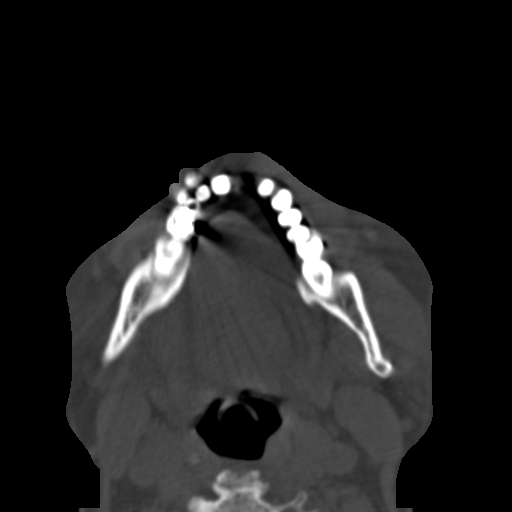
[im 37/83  bone]
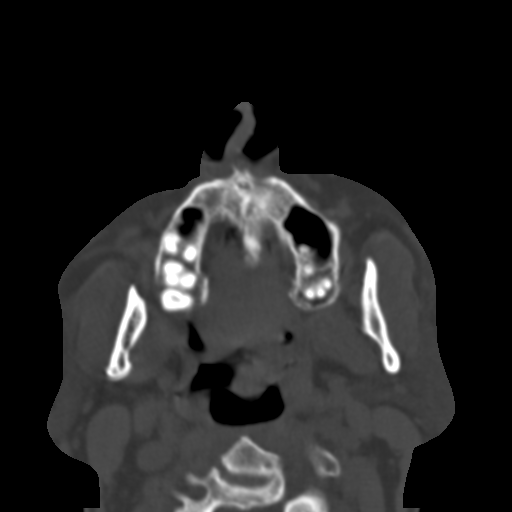
[im 46/83  brain]
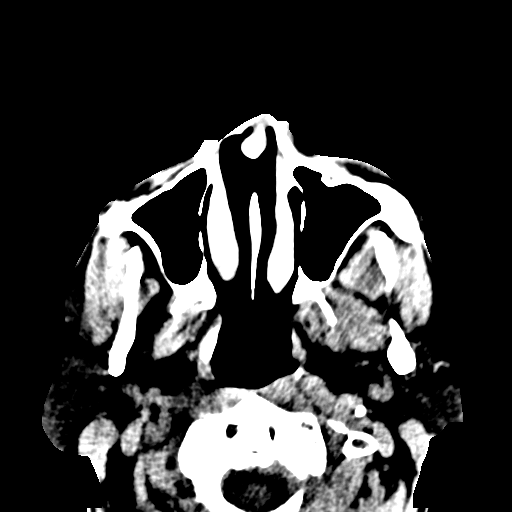
[im 46/83  bone]
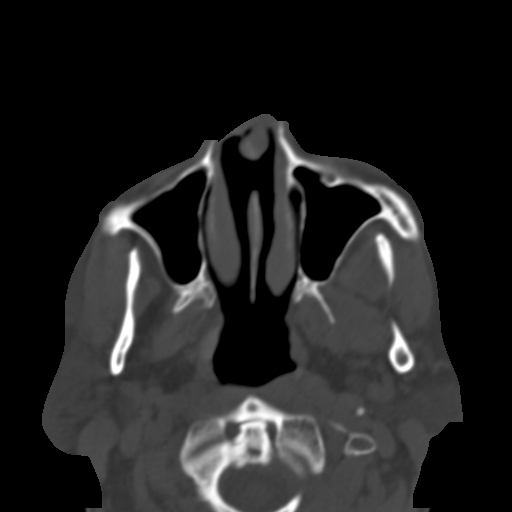
[im 58/83  bone]
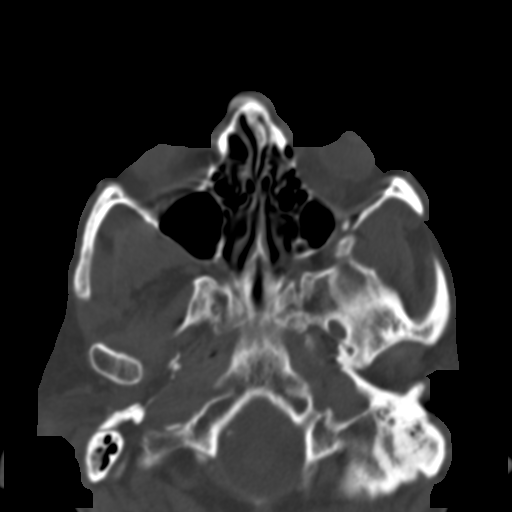
[im 66/83  bone]
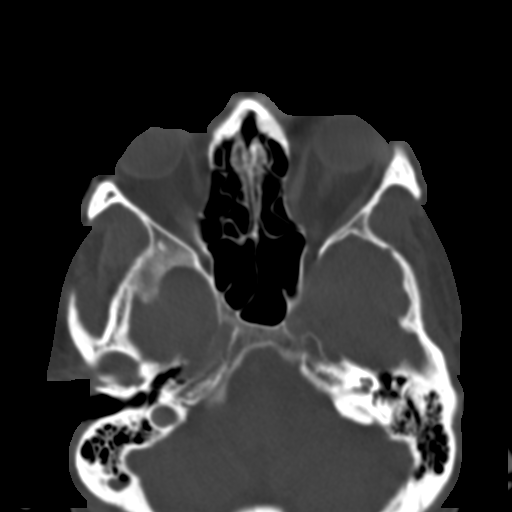
[im 74/83  bone]
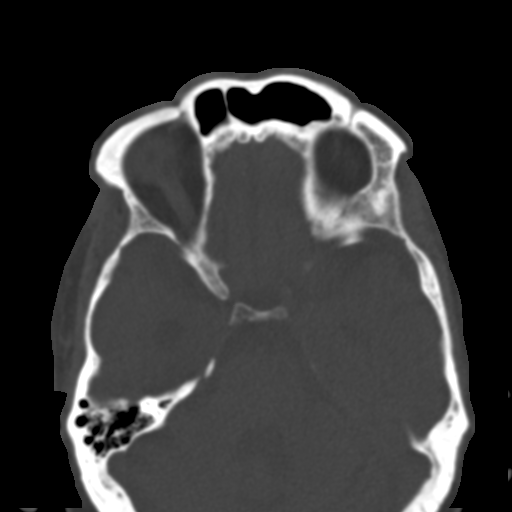

[Series 5: coronal soft · coronal · 0.32mm/px · 3 of 75 slices shown]
[im 19/75  bone]
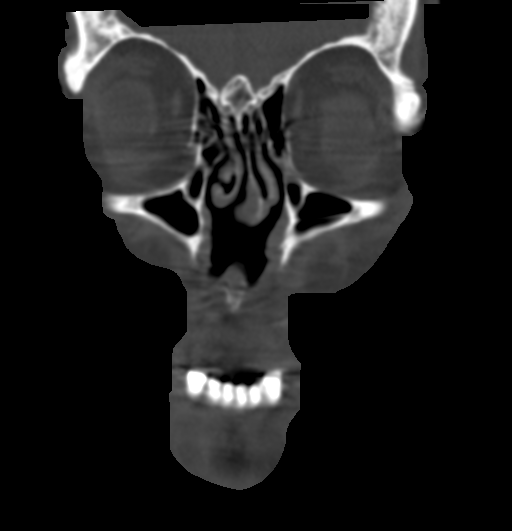
[im 38/75  bone]
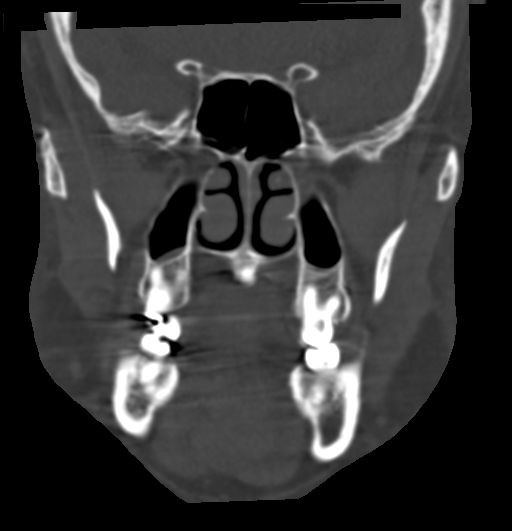
[im 56/75  bone]
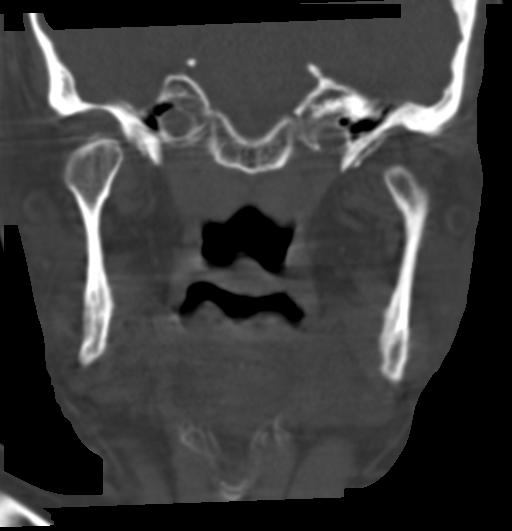

[Series 14: sagittal soft · sagittal · 0.14mm/px · 2 of 82 slices shown]
[im 28/82  bone]
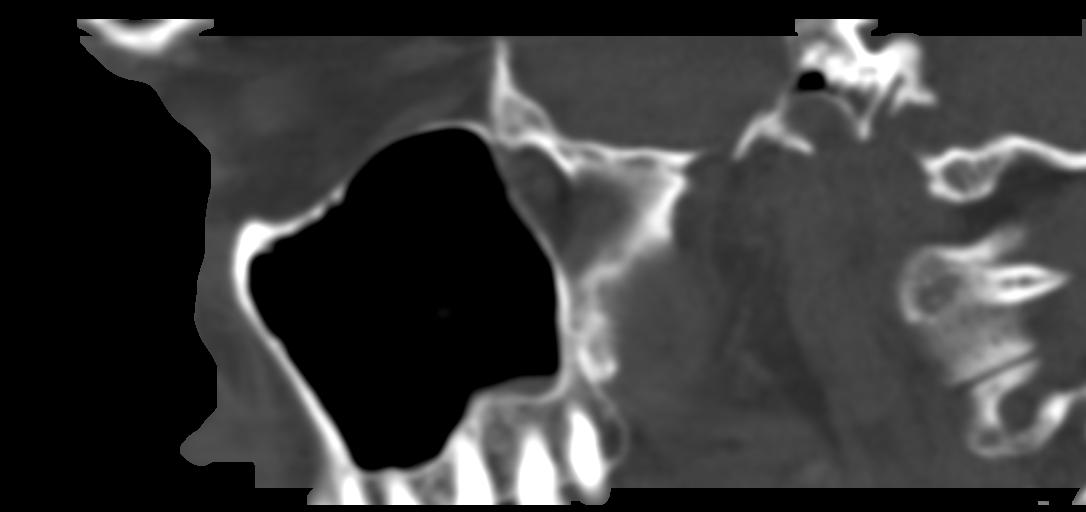
[im 55/82  bone]
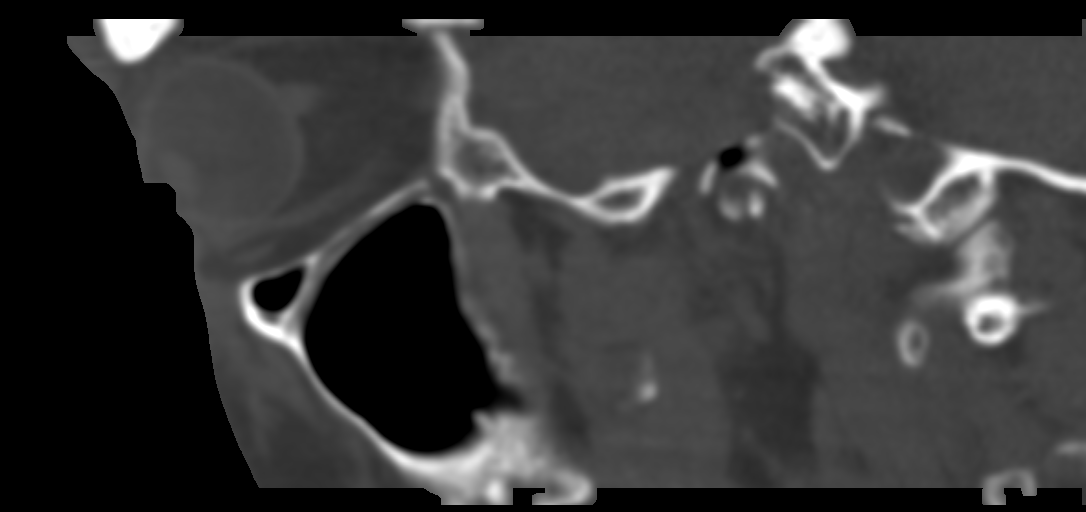

[13 of 47 positions shown; findings below may reference images not displayed]

FINDINGS: CT HEAD FINDINGS

Brain: No evidence of acute infarction, hemorrhage, hydrocephalus,
extra-axial collection or mass lesion/mass effect. Mild cerebral
atrophy. Mild ventricular dilatation consistent with central
atrophy. Cavum septum pellucidum.

Vascular: Moderate intracranial arterial vascular calcifications.

Skull: Calvarium appears intact. No acute depressed skull fractures.

Other: None.

CT MAXILLOFACIAL FINDINGS

Osseous: Old nasal bone deformities.  No evidence of acute fracture.

Orbits: Negative. No traumatic or inflammatory finding.

Sinuses: Retention cyst in the right maxillary antrum. Paranasal
sinuses and mastoid air cells are otherwise clear.

Soft tissues: Mild soft tissue swelling underneath the left orbit.
No significant hematoma.

CT CERVICAL SPINE FINDINGS

Alignment: Straightening of usual cervical lordosis without anterior
subluxation. Normal alignment of the facet joints. This is likely
positional or degenerative but muscle spasm could also have this
appearance.

Skull base and vertebrae: Skull base appears intact. No vertebral
compression deformities. No focal bone lesion or bone destruction.

Soft tissues and spinal canal: No prevertebral soft tissue swelling.
No abnormal paraspinal soft tissue mass or infiltration.

Disc levels: Diffuse degenerative change with narrowed interspaces
and endplate hypertrophic changes throughout. Degenerative changes
in the facet joints. Facet joint and uncovertebral spurring causes
some bone encroachment upon the neural foramina bilaterally.

Upper chest: Motion artifact limits examination but there appears to
be infiltration or edema in the right lung apex.

Other: None.
IMPRESSION: 1. No acute intracranial abnormalities. Mild chronic atrophy.
2. No acute displaced orbital or facial fractures identified. Mild
soft tissue swelling underneath the left orbit.
3. Nonspecific straightening of usual cervical lordosis. Diffuse
degenerative changes. No acute displaced fractures identified.
4. Infiltration or edema in the right lung apex.

## 2021-03-13 ENCOUNTER — Emergency Department (HOSPITAL_COMMUNITY)
Admission: EM | Admit: 2021-03-13 | Discharge: 2021-03-14 | Disposition: A | Discharge status: Home / Self Care | Payer: Medicare Other | Attending: Emergency Medicine | Admitting: Emergency Medicine

## 2021-03-13 ENCOUNTER — Emergency Department (HOSPITAL_COMMUNITY): Payer: Medicare Other

## 2021-03-13 ENCOUNTER — Other Ambulatory Visit: Payer: Self-pay

## 2021-03-13 DIAGNOSIS — Z91040 Latex allergy status: Secondary | ICD-10-CM | POA: Insufficient documentation

## 2021-03-13 DIAGNOSIS — W06XXXA Fall from bed, initial encounter: Secondary | ICD-10-CM | POA: Diagnosis not present

## 2021-03-13 DIAGNOSIS — Z79899 Other long term (current) drug therapy: Secondary | ICD-10-CM | POA: Insufficient documentation

## 2021-03-13 DIAGNOSIS — E039 Hypothyroidism, unspecified: Secondary | ICD-10-CM | POA: Insufficient documentation

## 2021-03-13 DIAGNOSIS — Y92129 Unspecified place in nursing home as the place of occurrence of the external cause: Secondary | ICD-10-CM | POA: Insufficient documentation

## 2021-03-13 DIAGNOSIS — W19XXXA Unspecified fall, initial encounter: Secondary | ICD-10-CM

## 2021-03-13 DIAGNOSIS — S01112A Laceration without foreign body of left eyelid and periocular area, initial encounter: Secondary | ICD-10-CM | POA: Insufficient documentation

## 2021-03-13 DIAGNOSIS — F0390 Unspecified dementia without behavioral disturbance: Secondary | ICD-10-CM | POA: Insufficient documentation

## 2021-03-13 DIAGNOSIS — Z7982 Long term (current) use of aspirin: Secondary | ICD-10-CM | POA: Diagnosis not present

## 2021-03-13 DIAGNOSIS — S0181XA Laceration without foreign body of other part of head, initial encounter: Secondary | ICD-10-CM

## 2021-03-13 DIAGNOSIS — S0993XA Unspecified injury of face, initial encounter: Secondary | ICD-10-CM | POA: Diagnosis present

## 2021-03-13 NOTE — Discharge Instructions (Signed)
Return for redness, drainage, fever.    

## 2021-03-13 NOTE — ED Triage Notes (Signed)
Pt coming in from [facility]. Staff report pt fell from bed and hit his head on the floor. No LOC. Laceration to left eye lid.

## 2021-03-13 NOTE — ED Notes (Signed)
PTAR called for pt transport to [facility]. Attempt to call report to [facility]. No response. Will call back

## 2021-03-13 NOTE — ED Notes (Signed)
Awaiting PTAR. 

## 2021-03-13 NOTE — ED Provider Notes (Signed)
[facility]-EMERGENCY DEPT Provider Note   CSN: [id number] Arrival date & time: 03/13/21  1814     History Chief Complaint  Patient presents with  . Fall    Steven Binetimothy Cordle is a 55 y.o. male.  55 yo M with a chief complaints of a fall out of bed.  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  Reportedly fell out of the bed.  Level 5 caveat dementia.  The history is provided by the patient.  Fall This is a new problem. The problem occurs constantly. The problem has not changed since onset.Pertinent negatives include no chest pain, no abdominal pain, no headaches and no shortness of breath. Nothing aggravates the symptoms. Nothing relieves the symptoms. He has tried nothing for the symptoms. The treatment provided no relief.       Past Medical History:  Diagnosis Date  . Addison disease (HCC)   . Anoxic brain injury (HCC)    TBI  . Anxiety   . Aspiration pneumonia (HCC)   . Atrial fibrillation (HCC)   . Chronic constipation 11/23/2011  . Dementia (HCC)   . Dysphagia   . Encephalopathy   . Frontal lobe syndrome   . Gait abnormality    freq falls  . Glaucoma   . Glucocorticoid deficiency (HCC)   . History of heart attack   . History of recurrent UTIs   . Hyperlipemia   . Hypothyroidism   . Mental disorder   . Myocardial infarction (HCC)   . Pneumoperitoneum of unknown etiology 12/21/2011  . Quadriplegia (HCC)   . Reflux   . Seizure disorder (HCC)   . Weakness of both legs     Patient Active Problem List   Diagnosis Date Noted  . PEG Gastrostomy tube in place (HCC) 01/26/2018  . 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 01/26/2018 01/26/2018  . Bedridden 01/26/2018  . Quadriplegia (HCC)   . Abdominal wall abscess 01/25/2018  . Fracture of humerus anatomical neck, right, closed, initial encounter 10/03/2017  . AKI (acute kidney injury) (HCC) 10/03/2017  . UTI (urinary tract  infection) 10/03/2017  . Agitation 07/03/2014  . Cardiomyopathy, ischemic 06/18/2014  . 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 Acute psychosis (HCC) 04/19/2014  . Unspecified hypothyroidism 05/16/2013  . Constipation 05/16/2013  . Anemia of other chronic disease 05/16/2013  . Mental disorder 01/03/2013  . Addisons disease (HCC) 01/03/2013  . PEG (percutaneous endoscopic gastrostomy) adjustment/replacement/removal (HCC) 12/21/2011  . Hypokalemia 11/25/2011  . Thrombocytopenia (HCC) 11/25/2011  . FTT (failure to thrive) in adult 11/25/2011  . Grand mal seizure (HCC) 11/23/2011  . Depression (emotion) 11/23/2011  . History of atrial fibrillation 11/23/2011  . Other specified hemorrhagic conditions (HCC) 11/23/2011  . Glaucoma 11/23/2011  . Hypoglycemia 11/23/2011  . Chronic constipation 11/23/2011  . Anoxic brain injury (HCC) 11/12/2011  . Protein calorie malnutrition (HCC) 11/12/2011  . Hyperchloremia 11/12/2011  . Hyperthyroidism 11/12/2011  . Acute lower UTI 11/12/2011  . Dysphagia     Past Surgical History:  Procedure Laterality Date  . DEBRIDEMENT OF ABDOMINAL WALL ABSCESS  01/26/2018  . HERNIA MESH REMOVAL  01/26/2018   Infected mesh - removed w I&D abd wall abscess  . IR GENERIC HISTORICAL  10/20/2016   IR REPLC GASTRO/COLONIC TUBE PERCUT W/FLUORO 10/20/2016 Oley Balmaniel Hassell, MD [facility]INTER[facility]  . IR REPLACE G-TUBE SIMPLE WO FLUORO  01/28/2018  . IR REPLC GASTRO/COLONIC TUBE PERCUT W/FLUORO  04/21/2017  . IR REPLC  GASTRO/COLONIC TUBE PERCUT W/FLUORO  07/22/2017  . IR REPLC GASTRO/COLONIC TUBE PERCUT W/FLUORO  11/24/2017  . IR REPLC GASTRO/COLONIC TUBE PERCUT W/FLUORO  08/12/2020  . IRRIGATION AND DEBRIDEMENT ABSCESS N/A 01/26/2018   Procedure: IRRIGATION AND DEBRIDEMENT ABDOMINAL WALL ABSCESS WITH REMOVAL OF INFECTED MESH;  Surgeon: Karie Soda, MD;  Location: [facility];  Service: General;  Laterality: N/A;  . NEPHRECTOMY  unknown  . PEG TUBE PLACEMENT         No family history  on file.  Social History   Tobacco Use  . Smoking status: Never Smoker  . Smokeless tobacco: Never Used  Vaping Use  . Vaping Use: Never used  Substance Use Topics  . Alcohol use: No  . Drug use: No    Home Medications Prior to Admission medications   Medication Sig Start Date End Date Taking? Authorizing Provider  acetaminophen (TYLENOL) 325 MG tablet Place 650 mg into feeding tube every 4 (four) hours as needed for mild pain.    [provider]  Amino Acids-Protein Hydrolys (FEEDING SUPPLEMENT, PRO-STAT SUGAR FREE 64,) LIQD Place 30 mLs into feeding tube 2 (two) times daily.     [provider]  aspirin 81 MG chewable tablet Chew 81 mg by mouth daily.    [provider]  Carboxymeth-Glycerin-Polysorb (REFRESH OPTIVE ADVANCED OP) Place 1 drop into both eyes 3 (three) times daily.    [provider]  clonazePAM (KLONOPIN) 0.5 MG tablet Take 0.5 mg by mouth at bedtime.    [provider]  diclofenac Sodium (VOLTAREN) 1 % GEL Apply 4 g topically 4 (four) times daily as needed. 05/29/20   Hilts, Casimiro Needle, MD  divalproex (DEPAKOTE SPRINKLE) 125 MG capsule Take 500 mg by mouth 2 (two) times daily.  07/18/13   [provider]  levothyroxine (SYNTHROID, LEVOTHROID) 150 MCG tablet Place 150 mcg into feeding tube daily before breakfast.     [provider]  metoprolol tartrate (LOPRESSOR) 25 MG tablet Take 12.5 mg by mouth 2 (two) times daily.    [provider]  Multiple Vitamins-Minerals (CERTAVITE/ANTIOXIDANTS) LIQD 15 mLs by PEG Tube route daily at 12 noon.     [provider]  pantoprazole sodium (PROTONIX) 40 mg/20 mL PACK 40 mg by PEG Tube route daily at 12 noon.  11/30/11   [name], MD  PARoxetine (PAXIL) 10 MG tablet Take 10 mg by mouth daily.    [provider]  polyethylene glycol (MIRALAX / GLYCOLAX) packet 17 g by PEG Tube route 2 (two) times daily. Mix 17gm  In 4-8 ounces of liquid     [provider]  polyvinyl alcohol (LIQUIFILM TEARS) 1.4 % ophthalmic solution Place 1 drop into both eyes 2 (two) times daily.    [provider]  risperiDONE (RISPERDAL) 1 MG tablet Take 1 mg by mouth in the morning, at noon, in the evening, and at bedtime. 9 AM, 1 PM, 5 PM, 9 PM    [provider]  sennosides (SENOKOT) 8.8 MG/5ML syrup Place 15 mLs into feeding tube 2 (two) times daily.     [provider]  traZODone (DESYREL) 150 MG tablet Take 75 mg by mouth at bedtime.  08/02/13   [provider]    Allergies    Ativan [lorazepam], Latex, Morphine and related, Penicillins, Pyridium [phenazopyridine hcl], and Soy allergy  Review of Systems   Review of Systems  Unable to perform ROS: Dementia  Constitutional: Negative for chills and fever.  HENT:  Negative for congestion and facial swelling.   Eyes: Negative for discharge and visual disturbance.  Respiratory: Negative for shortness of breath.   Cardiovascular: Negative for chest pain and palpitations.  Gastrointestinal: Negative for abdominal pain, diarrhea and vomiting.  Musculoskeletal: Negative for arthralgias and myalgias.  Skin: Positive for wound. Negative for color change and rash.  Neurological: Negative for tremors, syncope and headaches.  Psychiatric/Behavioral: Negative for confusion and dysphoric mood.    Physical Exam Updated Vital Signs BP 103/65   Pulse 88   Temp 99.2 F (37.3 C) (Oral)   Resp 18   Ht 5\' 9"  (1.753 m)   Wt 81.6 kg   SpO2 95%   BMI 26.58 kg/m   Physical Exam Vitals and nursing note reviewed.  Constitutional:      Appearance: He is well-developed.  HENT:     Head: Normocephalic.     Comments: Lac to the left upper eyelid. Eyes:     Pupils: Pupils are equal, round, and reactive to light.  Neck:     Vascular: No JVD.  Cardiovascular:     Rate and Rhythm: Normal rate and regular rhythm.     Heart sounds: No murmur heard. No friction rub. No  gallop.   Pulmonary:     Effort: No respiratory distress.     Breath sounds: No wheezing.  Abdominal:     General: There is no distension.     Tenderness: There is no abdominal tenderness. There is no guarding or rebound.  Musculoskeletal:        General: Normal range of motion.     Cervical back: Normal range of motion and neck supple.  Skin:    Coloration: Skin is not pale.     Findings: No rash.  Neurological:     Mental Status: He is alert and oriented to person, place, and time.  Psychiatric:        Behavior: Behavior normal.     ED Results / Procedures / Treatments   Labs (all labs ordered are listed, but only abnormal results are displayed) Labs Reviewed - No data to display  EKG None  Radiology CT Head Wo Contrast  Result Date: 03/13/2021 CLINICAL DATA:  Head injury after fall.  Abnormal mental status. EXAM: CT HEAD WITHOUT CONTRAST TECHNIQUE: Contiguous axial images were obtained from the base of the skull through the vertex without intravenous contrast. COMPARISON:  January 22, 2021. FINDINGS: Brain: No evidence of acute infarction, hemorrhage, hydrocephalus, extra-axial collection or mass lesion/mass effect. Vascular: No hyperdense vessel or unexpected calcification. Skull: Normal. Negative for fracture or focal lesion. Sinuses/Orbits: No acute finding. Other: None. IMPRESSION: Normal head CT. Electronically Signed   By: January 24, 2021 M.D.   On: 03/13/2021 19:12    Procedures .03/19/2022Laceration Repair  Date/Time: 03/13/2021 7:51 PM Performed by: 03/15/2021, DO Authorized by: Melene Plan, DO   Consent:    Consent obtained:  Verbal   Consent given by:  Patient   Risks, benefits, and alternatives were discussed: yes     Risks discussed:  Infection, pain, poor cosmetic result and poor wound healing   Alternatives discussed:  No treatment, delayed treatment and observation Universal protocol:    Patient identity confirmed:  Verbally with patient Anesthesia:     Anesthesia method:  None Laceration details:    Location:  Face   Face location:  L upper eyelid   Extent:  Superficial   Length (cm):  2.7 Exploration:    Limited defect created (wound  extended): no     Imaging obtained comment:  CT   Imaging outcome: foreign body not noted     Contaminated: no   Treatment:    Area cleansed with:  Saline   Amount of cleaning:  Standard   Irrigation solution:  Sterile saline   Irrigation volume:  20   Irrigation method:  Syringe   Visualized foreign bodies/material removed: no     Debridement:  None   Undermining:  None Skin repair:    Repair method:  Tissue adhesive Approximation:    Approximation:  Close Repair type:    Repair type:  Simple Post-procedure details:    Dressing:  Open (no dressing)     Medications Ordered in ED Medications - No data to display  ED Course  I have reviewed the triage vital signs and the nursing notes.  Pertinent labs & imaging results that were available during my care of the patient were reviewed by me and considered in my medical decision making (see chart for details).    MDM Rules/Calculators/A&P                          55 yo M with a cc of a fall out of bed.  CT the head is negative.  Wound is repaired at bedside.  Discharge home.  7:53 PM:  I have discussed the diagnosis/risks/treatment options with the patient and believe the pt to be eligible for discharge home to follow-up with PCP. We also discussed returning to the ED immediately if new or worsening sx occur. We discussed the sx which are most concerning (e.g., sudden worsening pain, fever, inability to tolerate by mouth) that necessitate immediate return. Medications administered to the patient during their visit and any new prescriptions provided to the patient are listed below.  Medications given during this visit Medications - No data to display   The patient appears reasonably screen and/or stabilized for discharge and I doubt any  other medical condition or other [facility] requiring further screening, evaluation, or treatment in the ED at this time prior to discharge.   Final Clinical Impression(s) / ED Diagnoses Final diagnoses:  Fall, initial encounter  Facial laceration, initial encounter    Rx / DC Orders ED Discharge Orders    None       Melene Plan, DO 03/13/21 1953

## 2021-03-14 NOTE — ED Notes (Signed)
An attempt was made to retrieve vitals, but the pt refused.

## 2021-04-15 IMAGING — CT CT HEAD W/O CM
3 series · 15 of 47 positions shown, 18 images · non-contrast
Comparison: January 22, 2021.

CLINICAL DATA: Head injury after fall.  Abnormal mental status.

EXAM:
CT HEAD WITHOUT CONTRAST
TECHNIQUE: Contiguous axial images were obtained from the base of the skull
through the vertex without intravenous contrast.

[Series 2: head wo · axial · 0.47mm/px · z∈[+1409,+1549]mm · 9 of 34 slices shown, 12 images]
[im 3/34  brain]
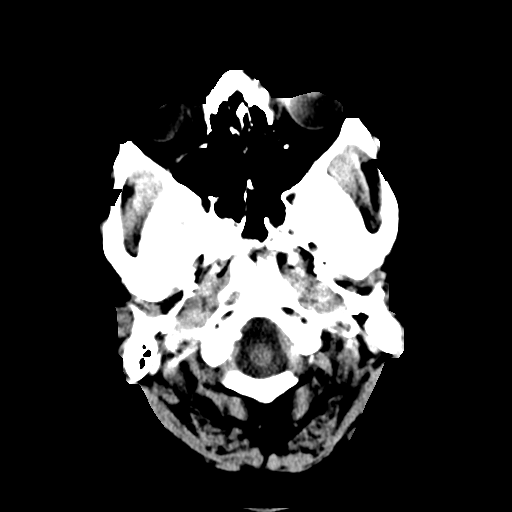
[im 3/34  bone]
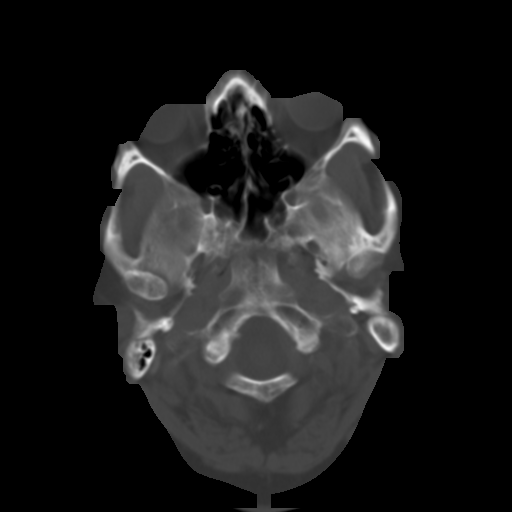
[im 6/34  brain]
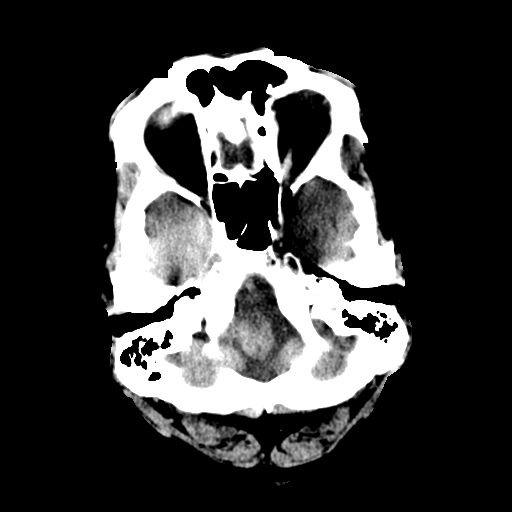
[im 10/34  brain]
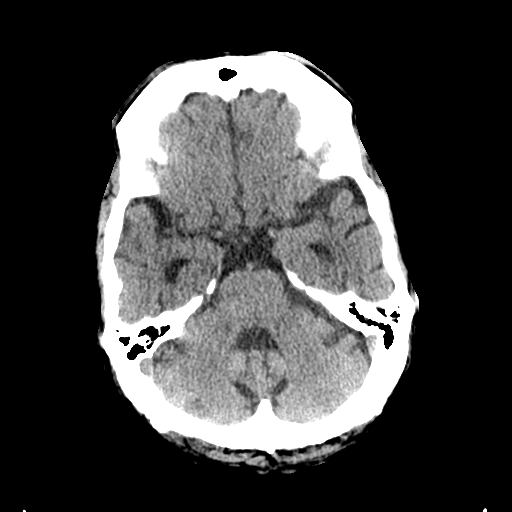
[im 13/34  brain]
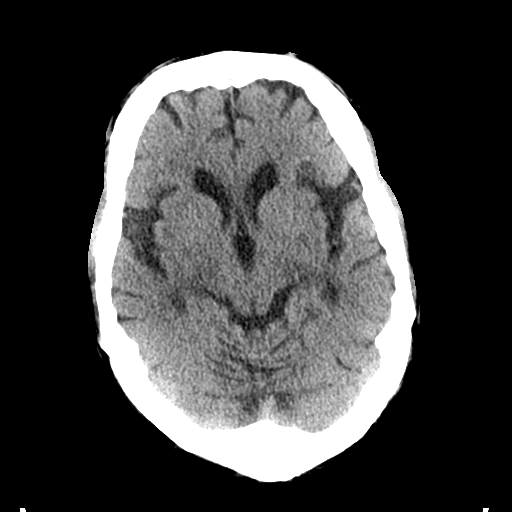
[im 18/34  brain]
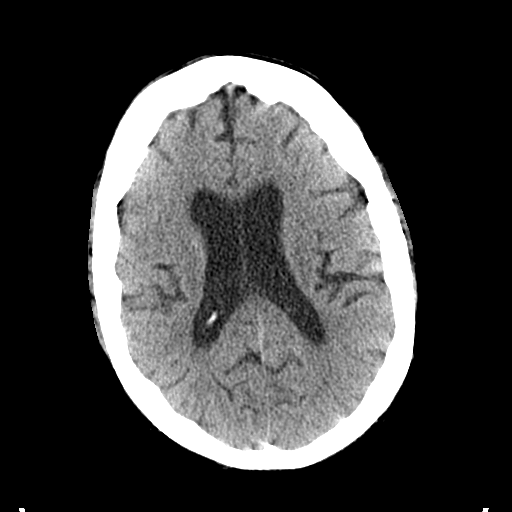
[im 18/34  bone]
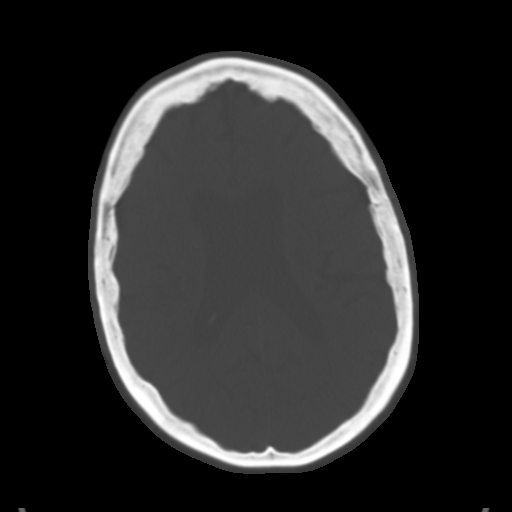
[im 21/34  brain]
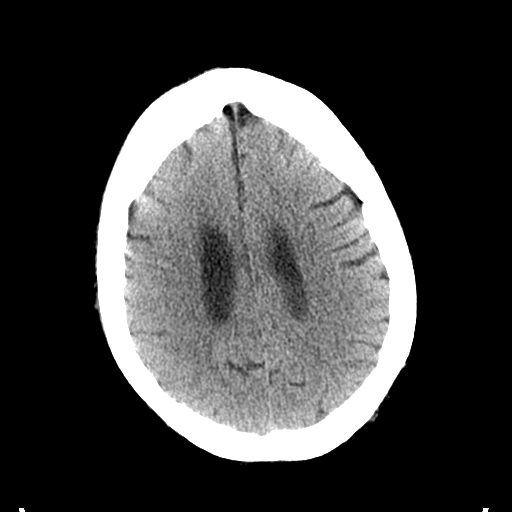
[im 24/34  brain]
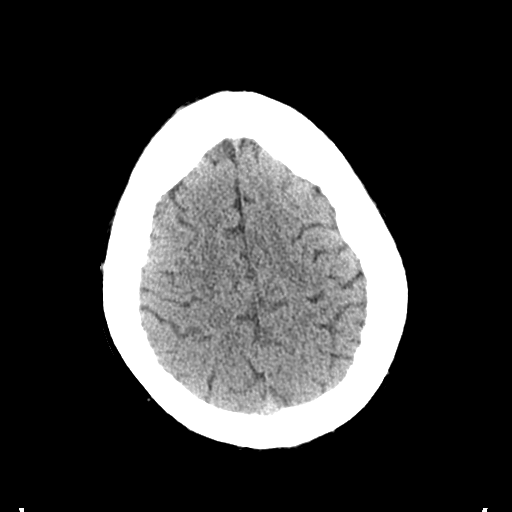
[im 28/34  brain]
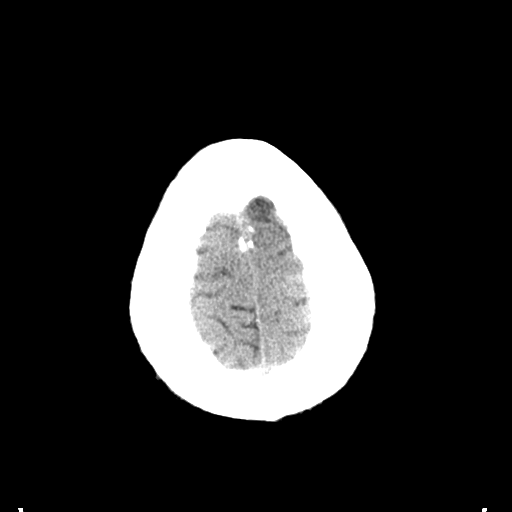
[im 31/34  brain]
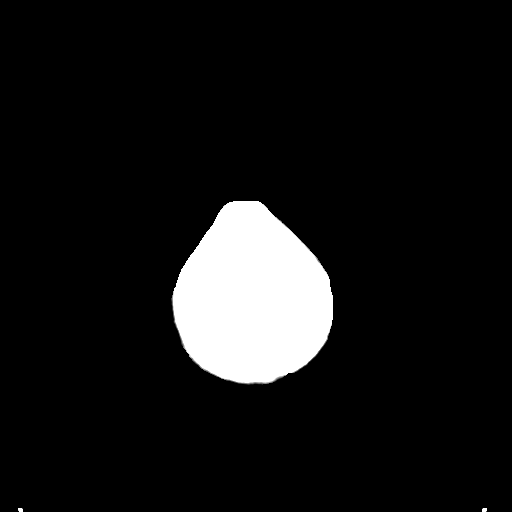
[im 31/34  bone]
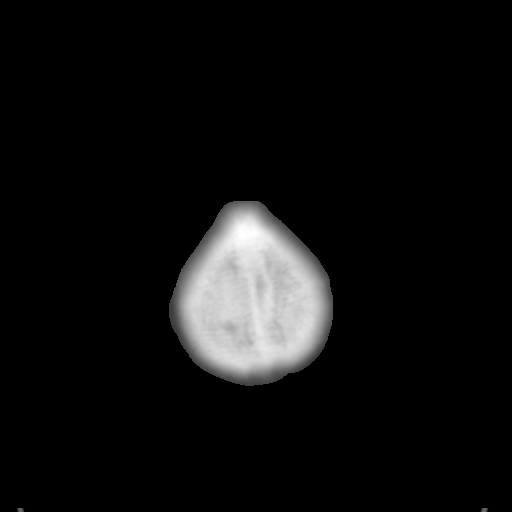

[Series 5: coronal soft tissue · coronal · 0.34mm/px · 3 of 84 slices shown]
[im 28/84  brain]
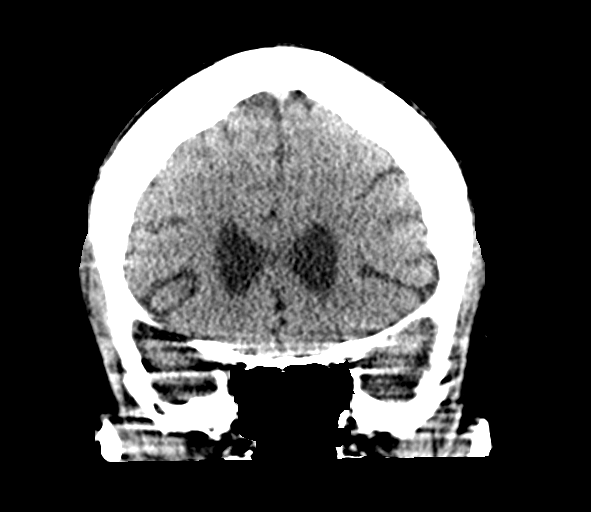
[im 37/84  brain]
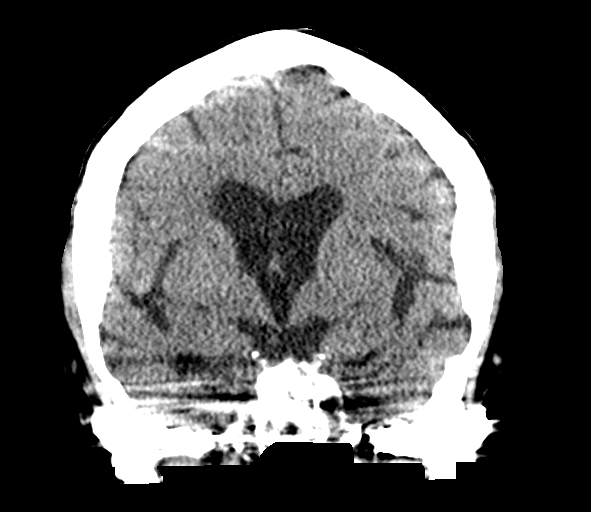
[im 47/84  brain]
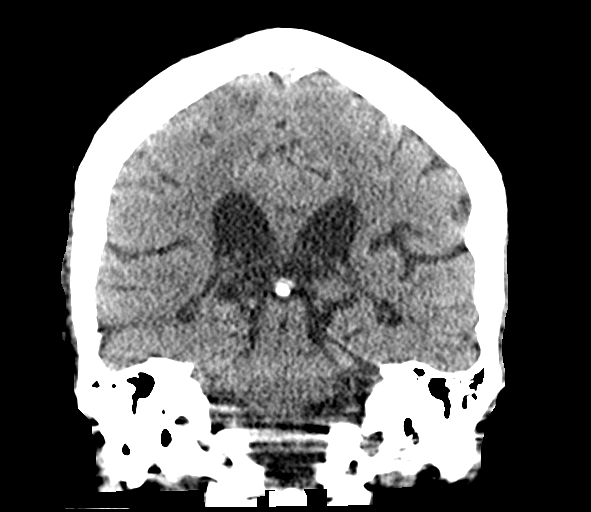

[Series 6: sagittal soft tissue · sagittal · 0.37mm/px · 3 of 65 slices shown]
[im 22/65  brain]
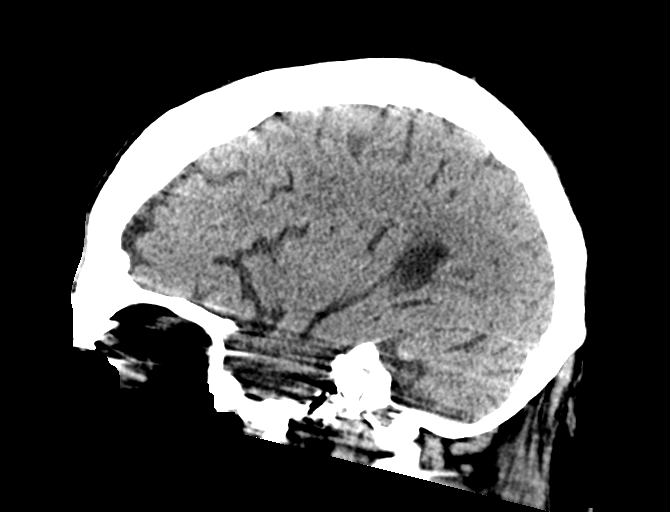
[im 33/65  brain]
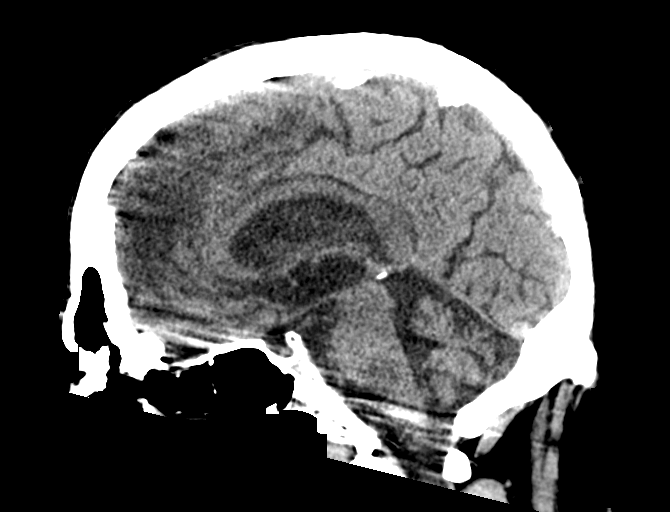
[im 43/65  brain]
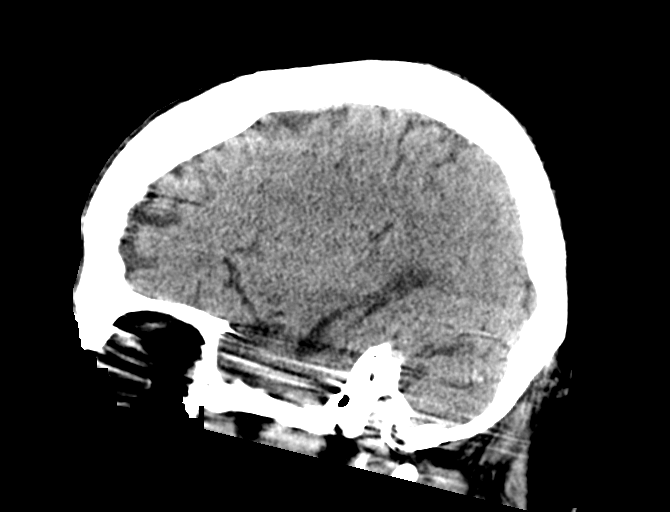

[15 of 47 positions shown; findings below may reference images not displayed]

FINDINGS: Brain: No evidence of acute infarction, hemorrhage, hydrocephalus,
extra-axial collection or mass lesion/mass effect.

Vascular: No hyperdense vessel or unexpected calcification.

Skull: Normal. Negative for fracture or focal lesion.

Sinuses/Orbits: No acute finding.

Other: None.
IMPRESSION: Normal head CT.

## 2021-05-08 ENCOUNTER — Other Ambulatory Visit: Payer: Self-pay

## 2021-05-08 ENCOUNTER — Emergency Department (HOSPITAL_COMMUNITY)
Admission: EM | Admit: 2021-05-08 | Discharge: 2021-05-09 | Disposition: A | Discharge status: Home / Self Care | Payer: Medicare Other | Attending: Emergency Medicine | Admitting: Emergency Medicine

## 2021-05-08 DIAGNOSIS — F0390 Unspecified dementia without behavioral disturbance: Secondary | ICD-10-CM | POA: Insufficient documentation

## 2021-05-08 DIAGNOSIS — S0083XA Contusion of other part of head, initial encounter: Secondary | ICD-10-CM

## 2021-05-08 DIAGNOSIS — W19XXXA Unspecified fall, initial encounter: Secondary | ICD-10-CM | POA: Diagnosis not present

## 2021-05-08 DIAGNOSIS — Z23 Encounter for immunization: Secondary | ICD-10-CM | POA: Insufficient documentation

## 2021-05-08 DIAGNOSIS — S01112A Laceration without foreign body of left eyelid and periocular area, initial encounter: Secondary | ICD-10-CM | POA: Diagnosis not present

## 2021-05-08 DIAGNOSIS — Z79899 Other long term (current) drug therapy: Secondary | ICD-10-CM | POA: Insufficient documentation

## 2021-05-08 DIAGNOSIS — E039 Hypothyroidism, unspecified: Secondary | ICD-10-CM | POA: Diagnosis not present

## 2021-05-08 DIAGNOSIS — Z91040 Latex allergy status: Secondary | ICD-10-CM | POA: Diagnosis not present

## 2021-05-08 DIAGNOSIS — S0592XA Unspecified injury of left eye and orbit, initial encounter: Secondary | ICD-10-CM | POA: Diagnosis present

## 2021-05-08 DIAGNOSIS — Z7982 Long term (current) use of aspirin: Secondary | ICD-10-CM | POA: Diagnosis not present

## 2021-05-08 NOTE — ED Triage Notes (Signed)
55 yo BIBA s/p fall  At [facility]. Pt has history of dementia and anoxic brain injury. Fall was unwitnessed. Pt suffered a lac above left eye. No other complaints at this time. Pt able to follow directions but unable to appropriately communicate per EMS.   Vitals: bp 117/91 Hr 80 rr 18 97 % on RA cbg 109

## 2021-05-08 NOTE — ED Notes (Signed)
Pt has superficial lac on upper left eyebrow, bleeding is controlled, there is notable bruising. Pt complaining of pain in left shoulder and down left side. Pt is alert and able to communicate needs but unable to answer all questions appropriately.

## 2021-05-09 ENCOUNTER — Emergency Department (HOSPITAL_COMMUNITY): Payer: Medicare Other

## 2021-05-09 DIAGNOSIS — S01112A Laceration without foreign body of left eyelid and periocular area, initial encounter: Secondary | ICD-10-CM | POA: Diagnosis not present

## 2021-05-09 MED ORDER — TETANUS-DIPHTH-ACELL PERTUSSIS 5-2.5-18.5 LF-MCG/0.5 IM SUSY
0.5000 mL | PREFILLED_SYRINGE | Freq: Once | INTRAMUSCULAR | Status: AC
  Administered 2021-05-09: 0.5 mL via INTRAMUSCULAR
  Filled 2021-05-09: qty 0.5

## 2021-05-09 MED ORDER — LIDOCAINE-EPINEPHRINE-TETRACAINE (LET) TOPICAL GEL
3.0000 mL | Freq: Once | TOPICAL | Status: AC
  Administered 2021-05-09: 3 mL via TOPICAL
  Filled 2021-05-09: qty 3

## 2021-05-09 MED ORDER — LIDOCAINE-EPINEPHRINE (PF) 2 %-1:200000 IJ SOLN
10.0000 mL | Freq: Once | INTRAMUSCULAR | Status: DC
  Filled 2021-05-09: qty 20

## 2021-05-09 NOTE — Discharge Instructions (Addendum)
1. Medications: usual home medications 2. Treatment: rest, drink plenty of fluids, wound clean with warm soap and water, apply bacitracin or Neosporin 3. Follow Up: Please followup with your primary doctor in 1-2 days for discussion of your diagnoses and further evaluation after today's visit; if you do not have a primary care doctor use the resource guide provided to find one; Please return to the ER for vomiting, altered mental status, persistent bleeding or other concerns.

## 2021-05-09 NOTE — ED Notes (Signed)
[facility] called for pt transport. 

## 2021-05-09 NOTE — ED Notes (Signed)
Attempt to call [facility] to give report. Spoke with [place] regarding pt returning to [facility]. Call was disconnected. No response when called back

## 2021-05-09 NOTE — ED Notes (Signed)
Pt wound to left eye irrigated and cleaned. LET applied to wound. Wound appears to be an abrasion with large hematoma

## 2021-05-09 NOTE — ED Provider Notes (Signed)
[facility]-EMERGENCY DEPT Provider Note   CSN: [id number] Arrival date & time: 05/08/21  2156     History Chief Complaint  Patient presents with  . Fall    [name] is a 55 y.o. male presents to the Emergency Department complaining of acute, persistent laceration above the left eyebrow after reported fall from his nursing home.  [facility].    Records reviewed.  Patient has had numerous falls in the past several months.  Per EMS, fall was unwitnessed by nursing home staff.  He was found on the floor, 911 was contacted and he was transported.  Patient unable to provide additional history due to history of anoxic brain injury.  Level 5 caveat.  Unable to reach [facility] for additional information.  The history is provided by medical records and the EMS personnel. The history is limited by the condition of the patient. No language interpreter was used.       Past Medical History:  Diagnosis Date  . Addison disease (HCC)   . Anoxic brain injury (HCC)    TBI  . Anxiety   . Aspiration pneumonia (HCC)   . Atrial fibrillation (HCC)   . Chronic constipation 11/23/2011  . Dementia (HCC)   . Dysphagia   . Encephalopathy   . Frontal lobe syndrome   . Gait abnormality    freq falls  . Glaucoma   . Glucocorticoid deficiency (HCC)   . History of heart attack   . History of recurrent UTIs   . Hyperlipemia   . Hypothyroidism   . Mental disorder   . Myocardial infarction (HCC)   . Pneumoperitoneum of unknown etiology 12/21/2011  . Quadriplegia (HCC)   . Reflux   . Seizure disorder (HCC)   . Weakness of both legs     Patient Active Problem List   Diagnosis Date Noted  . PEG Gastrostomy tube in place (HCC) 01/26/2018  . 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 01/26/2018 01/26/2018  . Bedridden 01/26/2018  . Quadriplegia (HCC)   . Abdominal wall abscess 01/25/2018  . Fracture of humerus anatomical neck,  right, closed, initial encounter 10/03/2017  . AKI (acute kidney injury) (HCC) 10/03/2017  . UTI (urinary tract infection) 10/03/2017  . Agitation 07/03/2014  . Cardiomyopathy, ischemic 06/18/2014  . 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 Acute psychosis (HCC) 04/19/2014  . Unspecified hypothyroidism 05/16/2013  . Constipation 05/16/2013  . Anemia of other chronic disease 05/16/2013  . Mental disorder 01/03/2013  . Addisons disease (HCC) 01/03/2013  . PEG (percutaneous endoscopic gastrostomy) adjustment/replacement/removal (HCC) 12/21/2011  . Hypokalemia 11/25/2011  . Thrombocytopenia (HCC) 11/25/2011  . FTT (failure to thrive) in adult 11/25/2011  . Grand mal seizure (HCC) 11/23/2011  . Depression (emotion) 11/23/2011  . History of atrial fibrillation 11/23/2011  . Other specified hemorrhagic conditions (HCC) 11/23/2011  . Glaucoma 11/23/2011  . Hypoglycemia 11/23/2011  . Chronic constipation 11/23/2011  . Anoxic brain injury (HCC) 11/12/2011  . Protein calorie malnutrition (HCC) 11/12/2011  . Hyperchloremia 11/12/2011  . Hyperthyroidism 11/12/2011  . Acute lower UTI 11/12/2011  . Dysphagia     Past Surgical History:  Procedure Laterality Date  . DEBRIDEMENT OF ABDOMINAL WALL ABSCESS  01/26/2018  . HERNIA MESH REMOVAL  01/26/2018   Infected mesh - removed w I&D abd wall abscess  . IR GENERIC HISTORICAL  10/20/2016   IR REPLC GASTRO/COLONIC TUBE PERCUT W/FLUORO 10/20/2016 Oley Balmaniel Hassell, MD [facility]-INTERV RAD  . IR REPLACE G-TUBE  SIMPLE WO FLUORO  01/28/2018  . IR REPLC GASTRO/COLONIC TUBE PERCUT W/FLUORO  04/21/2017  . IR REPLC GASTRO/COLONIC TUBE PERCUT W/FLUORO  07/22/2017  . IR REPLC GASTRO/COLONIC TUBE PERCUT W/FLUORO  11/24/2017  . IR REPLC GASTRO/COLONIC TUBE PERCUT W/FLUORO  08/12/2020  . IRRIGATION AND DEBRIDEMENT ABSCESS N/A 01/26/2018   Procedure: IRRIGATION AND DEBRIDEMENT ABDOMINAL WALL ABSCESS WITH REMOVAL OF INFECTED MESH;  Surgeon: Karie Soda, MD;  Location: [facility] ORS;   Service: General;  Laterality: N/A;  . NEPHRECTOMY  unknown  . PEG TUBE PLACEMENT         No family history on file.  Social History   Tobacco Use  . Smoking status: Never Smoker  . Smokeless tobacco: Never Used  Vaping Use  . Vaping Use: Never used  Substance Use Topics  . Alcohol use: No  . Drug use: No    Home Medications Prior to Admission medications   Medication Sig Start Date End Date Taking? Authorizing Provider  acetaminophen (TYLENOL) 325 MG tablet Place 650 mg into feeding tube every 4 (four) hours as needed for mild pain.    [provider]  Amino Acids-Protein Hydrolys (FEEDING SUPPLEMENT, PRO-STAT SUGAR FREE 64,) LIQD Place 30 mLs into feeding tube 2 (two) times daily.     [provider]  aspirin 81 MG chewable tablet Chew 81 mg by mouth daily.    [provider]  Carboxymeth-Glycerin-Polysorb (REFRESH OPTIVE ADVANCED OP) Place 1 drop into both eyes 3 (three) times daily.    [provider]  clonazePAM (KLONOPIN) 0.5 MG tablet Take 0.5 mg by mouth at bedtime.    [provider]  diclofenac Sodium (VOLTAREN) 1 % GEL Apply 4 g topically 4 (four) times daily as needed. 05/29/20   Hilts, Casimiro Needle, MD  divalproex (DEPAKOTE SPRINKLE) 125 MG capsule Take 500 mg by mouth 2 (two) times daily.  07/18/13   [provider]  levothyroxine (SYNTHROID, LEVOTHROID) 150 MCG tablet Place 150 mcg into feeding tube daily before breakfast.     [provider]  metoprolol tartrate (LOPRESSOR) 25 MG tablet Take 12.5 mg by mouth 2 (two) times daily.    [provider]  Multiple Vitamins-Minerals (CERTAVITE/ANTIOXIDANTS) LIQD 15 mLs by PEG Tube route daily at 12 noon.     [provider]  pantoprazole sodium (PROTONIX) 40 mg/20 mL PACK 40 mg by PEG Tube route daily at 12 noon.  11/30/11   [name], MD  PARoxetine (PAXIL) 10 MG tablet Take 10 mg by mouth daily.    [provider]  polyethylene glycol  (MIRALAX / GLYCOLAX) packet 17 g by PEG Tube route 2 (two) times daily. Mix 17gm  In 4-8 ounces of liquid    [provider]  polyvinyl alcohol (LIQUIFILM TEARS) 1.4 % ophthalmic solution Place 1 drop into both eyes 2 (two) times daily.    [provider]  risperiDONE (RISPERDAL) 1 MG tablet Take 1 mg by mouth in the morning, at noon, in the evening, and at bedtime. 9 AM, 1 PM, 5 PM, 9 PM    [provider]  sennosides (SENOKOT) 8.8 MG/5ML syrup Place 15 mLs into feeding tube 2 (two) times daily.     [provider]  traZODone (DESYREL) 150 MG tablet Take 75 mg by mouth at bedtime.  08/02/13   [provider]    Allergies    Ativan [lorazepam], Latex, Morphine and related, Penicillins, Pyridium [phenazopyridine hcl], and Soy allergy  Review of Systems  Review of Systems  Unable to perform ROS: Other    Physical Exam Updated Vital Signs BP 129/73   Pulse 75   Temp (!) 97.4 F (36.3 C) (Oral)   Resp 16   SpO2 98%   Physical Exam Vitals and nursing note reviewed.  Constitutional:      General: He is in acute distress.     Appearance: He is not diaphoretic.     Comments: Uncomfortable appearing, crying in bed  HENT:     Head: Normocephalic.      Comments: Large contusion to the left eyebrow with abrasion. Swelling and contusion of the left lower lip Eyes:     General: No scleral icterus.    Conjunctiva/sclera: Conjunctivae normal.  Cardiovascular:     Rate and Rhythm: Normal rate and regular rhythm.     Pulses: Normal pulses.          Radial pulses are 2+ on the right side and 2+ on the left side.  Pulmonary:     Effort: No tachypnea, accessory muscle usage, prolonged expiration, respiratory distress or retractions.     Breath sounds: No stridor.     Comments: Equal chest rise. No increased work of breathing. Abdominal:     General: There is no distension.     Palpations: Abdomen is soft.     Tenderness: There is no abdominal  tenderness. There is no guarding or rebound.  Musculoskeletal:     Cervical back: Normal range of motion. No spinous process tenderness or muscular tenderness. Normal range of motion.     Comments: Moves all extremities equally and without difficulty.  Skin:    General: Skin is warm and dry.     Capillary Refill: Capillary refill takes less than 2 seconds.  Neurological:     Mental Status: He is alert.     GCS: GCS eye subscore is 4. GCS verbal subscore is 5. GCS motor subscore is 6.     Comments: Speech is clear and goal oriented.  Psychiatric:        Mood and Affect: Mood normal.     ED Results / Procedures / Treatments    Radiology CT Head Wo Contrast  Result Date: 05/09/2021 CLINICAL DATA:  Facial trauma. Unwitnessed fall, laceration above left eye with bruising, hx of dementia, anoxic brain injury, EXAM: CT HEAD WITHOUT CONTRAST CT MAXILLOFACIAL WITHOUT CONTRAST CT CERVICAL SPINE WITHOUT CONTRAST TECHNIQUE: Multidetector CT imaging of the head, cervical spine, and maxillofacial structures were performed using the standard protocol without intravenous contrast. Multiplanar CT image reconstructions of the cervical spine and maxillofacial structures were also generated. COMPARISON:  CT head, max face, C-spine 01/22/2021. FINDINGS: CT HEAD FINDINGS Brain: Cerebral ventricle sizes are concordant with the degree of cerebral volume loss. No evidence of large-territorial acute infarction. No parenchymal hemorrhage. No mass lesion. No extra-axial collection. No mass effect or midline shift. No hydrocephalus. Basilar cisterns are patent. Empty sella. Findings is often a normal anatomic variant but can be associated with idiopathic intracranial hypertension (pseudotumor cerebri). Vascular: No hyperdense vessel. Skull: No acute fracture or focal lesion. Other: None. CT MAXILLOFACIAL FINDINGS Osseous: Slightly limited evaluation of the mandible due to motion artifact. No fracture or mandibular  dislocation. No destructive process. Old healed nasal bone fracture. Old left lamina papyracea fracture. Multiple missing teeth. Sinuses/Orbits: Paranasal sinuses and mastoid air cells are clear. The orbits are unremarkable. Soft tissues: Left periorbital soft tissue edema/hematoma formation. CT CERVICAL SPINE FINDINGS Alignment: Reversal normal cervical lordosis centered  at the C5 level likely due to degenerative changes and positioning. Similar-appearing grade 1 anterolisthesis of C2 on C3. Skull base and vertebrae: Multilevel severe degenerative changes of the spine. No acute fracture. No aggressive appearing focal osseous lesion or focal pathologic process. Soft tissues and spinal canal: No prevertebral fluid or swelling. No visible canal hematoma. Upper chest: Unremarkable. Other: None. IMPRESSION: 1. No acute intracranial abnormality. 2. No acute displaced facial fracture. 3. No acute displaced fracture or traumatic listhesis of the cervical spine. Electronically Signed   By: Tish Frederickson M.D.   On: 05/09/2021 02:10   CT Cervical Spine Wo Contrast  Result Date: 05/09/2021 CLINICAL DATA:  Facial trauma. Unwitnessed fall, laceration above left eye with bruising, hx of dementia, anoxic brain injury, EXAM: CT HEAD WITHOUT CONTRAST CT MAXILLOFACIAL WITHOUT CONTRAST CT CERVICAL SPINE WITHOUT CONTRAST TECHNIQUE: Multidetector CT imaging of the head, cervical spine, and maxillofacial structures were performed using the standard protocol without intravenous contrast. Multiplanar CT image reconstructions of the cervical spine and maxillofacial structures were also generated. COMPARISON:  CT head, max face, C-spine 01/22/2021. FINDINGS: CT HEAD FINDINGS Brain: Cerebral ventricle sizes are concordant with the degree of cerebral volume loss. No evidence of large-territorial acute infarction. No parenchymal hemorrhage. No mass lesion. No extra-axial collection. No mass effect or midline shift. No hydrocephalus.  Basilar cisterns are patent. Empty sella. Findings is often a normal anatomic variant but can be associated with idiopathic intracranial hypertension (pseudotumor cerebri). Vascular: No hyperdense vessel. Skull: No acute fracture or focal lesion. Other: None. CT MAXILLOFACIAL FINDINGS Osseous: Slightly limited evaluation of the mandible due to motion artifact. No fracture or mandibular dislocation. No destructive process. Old healed nasal bone fracture. Old left lamina papyracea fracture. Multiple missing teeth. Sinuses/Orbits: Paranasal sinuses and mastoid air cells are clear. The orbits are unremarkable. Soft tissues: Left periorbital soft tissue edema/hematoma formation. CT CERVICAL SPINE FINDINGS Alignment: Reversal normal cervical lordosis centered at the C5 level likely due to degenerative changes and positioning. Similar-appearing grade 1 anterolisthesis of C2 on C3. Skull base and vertebrae: Multilevel severe degenerative changes of the spine. No acute fracture. No aggressive appearing focal osseous lesion or focal pathologic process. Soft tissues and spinal canal: No prevertebral fluid or swelling. No visible canal hematoma. Upper chest: Unremarkable. Other: None. IMPRESSION: 1. No acute intracranial abnormality. 2. No acute displaced facial fracture. 3. No acute displaced fracture or traumatic listhesis of the cervical spine. Electronically Signed   By: Tish Frederickson M.D.   On: 05/09/2021 02:10   CT Maxillofacial WO CM  Result Date: 05/09/2021 CLINICAL DATA:  Facial trauma. Unwitnessed fall, laceration above left eye with bruising, hx of dementia, anoxic brain injury, EXAM: CT HEAD WITHOUT CONTRAST CT MAXILLOFACIAL WITHOUT CONTRAST CT CERVICAL SPINE WITHOUT CONTRAST TECHNIQUE: Multidetector CT imaging of the head, cervical spine, and maxillofacial structures were performed using the standard protocol without intravenous contrast. Multiplanar CT image reconstructions of the cervical spine and  maxillofacial structures were also generated. COMPARISON:  CT head, max face, C-spine 01/22/2021. FINDINGS: CT HEAD FINDINGS Brain: Cerebral ventricle sizes are concordant with the degree of cerebral volume loss. No evidence of large-territorial acute infarction. No parenchymal hemorrhage. No mass lesion. No extra-axial collection. No mass effect or midline shift. No hydrocephalus. Basilar cisterns are patent. Empty sella. Findings is often a normal anatomic variant but can be associated with idiopathic intracranial hypertension (pseudotumor cerebri). Vascular: No hyperdense vessel. Skull: No acute fracture or focal lesion. Other: None. CT MAXILLOFACIAL FINDINGS Osseous: Slightly limited evaluation  of the mandible due to motion artifact. No fracture or mandibular dislocation. No destructive process. Old healed nasal bone fracture. Old left lamina papyracea fracture. Multiple missing teeth. Sinuses/Orbits: Paranasal sinuses and mastoid air cells are clear. The orbits are unremarkable. Soft tissues: Left periorbital soft tissue edema/hematoma formation. CT CERVICAL SPINE FINDINGS Alignment: Reversal normal cervical lordosis centered at the C5 level likely due to degenerative changes and positioning. Similar-appearing grade 1 anterolisthesis of C2 on C3. Skull base and vertebrae: Multilevel severe degenerative changes of the spine. No acute fracture. No aggressive appearing focal osseous lesion or focal pathologic process. Soft tissues and spinal canal: No prevertebral fluid or swelling. No visible canal hematoma. Upper chest: Unremarkable. Other: None. IMPRESSION: 1. No acute intracranial abnormality. 2. No acute displaced facial fracture. 3. No acute displaced fracture or traumatic listhesis of the cervical spine. Electronically Signed   By: Tish Frederickson M.D.   On: 05/09/2021 02:10    Procedures Procedures   Medications Ordered in ED Medications  lidocaine-EPINEPHrine (XYLOCAINE W/EPI) 2 %-1:200000 (PF)  injection 10 mL (has no administration in time range)  Tdap (BOOSTRIX) injection 0.5 mL (has no administration in time range)  lidocaine-EPINEPHrine-tetracaine (LET) topical gel (3 mLs Topical Given 05/09/21 0433)    ED Course  I have reviewed the triage vital signs and the nursing notes.  Pertinent labs & imaging results that were available during my care of the patient were reviewed by me and considered in my medical decision making (see chart for details).    MDM Rules/Calculators/A&P                           Patient [facility] fall.  Concern for possible facial fractures, cervical fracture.  CT head, face and neck are without acute abnormality.  Patient is not anticoagulated.  Abrasion over the left eye; does not need sutures.  Unable to obtain additional information from [facility]. Tdap updated.  4:14 AM The patient was discussed with and evaluated by Dr. Bebe Shaggy who agrees with the treatment plan.   Final Clinical Impression(s) / ED Diagnoses Final diagnoses:  Fall, initial encounter  Contusion of face, initial encounter    Rx / DC Orders ED Discharge Orders    None       Ninnie Fein, Boyd Kerbs 05/09/21 0444    Zadie Rhine, MD 05/09/21 [id number]

## 2021-06-01 ENCOUNTER — Other Ambulatory Visit: Payer: Self-pay

## 2021-06-01 ENCOUNTER — Encounter (HOSPITAL_COMMUNITY): Payer: Self-pay | Admitting: Emergency Medicine

## 2021-06-01 ENCOUNTER — Emergency Department (HOSPITAL_COMMUNITY): Payer: Medicare Other

## 2021-06-01 ENCOUNTER — Emergency Department (HOSPITAL_COMMUNITY)
Admission: EM | Admit: 2021-06-01 | Discharge: 2021-06-02 | Disposition: A | Discharge status: Home / Self Care | Payer: Medicare Other | Attending: Emergency Medicine | Admitting: Emergency Medicine

## 2021-06-01 DIAGNOSIS — Z7982 Long term (current) use of aspirin: Secondary | ICD-10-CM | POA: Diagnosis not present

## 2021-06-01 DIAGNOSIS — E039 Hypothyroidism, unspecified: Secondary | ICD-10-CM | POA: Insufficient documentation

## 2021-06-01 DIAGNOSIS — F0390 Unspecified dementia without behavioral disturbance: Secondary | ICD-10-CM | POA: Diagnosis not present

## 2021-06-01 DIAGNOSIS — K9423 Gastrostomy malfunction: Secondary | ICD-10-CM | POA: Diagnosis not present

## 2021-06-01 DIAGNOSIS — Z91040 Latex allergy status: Secondary | ICD-10-CM | POA: Diagnosis not present

## 2021-06-01 DIAGNOSIS — Z79899 Other long term (current) drug therapy: Secondary | ICD-10-CM | POA: Diagnosis not present

## 2021-06-01 MED ORDER — DIATRIZOATE MEGLUMINE & SODIUM 66-10 % PO SOLN
ORAL | Status: AC
  Filled 2021-06-01: qty 30

## 2021-06-01 MED ORDER — DIATRIZOATE MEGLUMINE & SODIUM 66-10 % PO SOLN
ORAL | Status: AC
  Administered 2021-06-01: 30 mL via GASTROSTOMY
  Filled 2021-06-01: qty 30

## 2021-06-01 NOTE — Discharge Instructions (Signed)
Followup with your doctor as needed

## 2021-06-01 NOTE — ED Notes (Signed)
[facility] called for pt transport. 

## 2021-06-01 NOTE — ED Notes (Signed)
PEG tube replaced with 80fr PEG tube

## 2021-06-01 NOTE — ED Provider Notes (Signed)
[facility]-EMERGENCY DEPT Provider Note   CSN: [id number] Arrival date & time: 06/01/21  1951     History No chief complaint on file.   [name] is a 55 y.o. male.  55 year old male presents after having removed his PEG tube just prior to arrival.  The incident occurred approximately 1 hour prior to arrival.  No other complaints noted        Past Medical History:  Diagnosis Date  . Addison disease (HCC)   . Anoxic brain injury (HCC)    TBI  . Anxiety   . Aspiration pneumonia (HCC)   . Atrial fibrillation (HCC)   . Chronic constipation 11/23/2011  . Dementia (HCC)   . Dysphagia   . Encephalopathy   . Frontal lobe syndrome   . Gait abnormality    freq falls  . Glaucoma   . Glucocorticoid deficiency (HCC)   . History of heart attack   . History of recurrent UTIs   . Hyperlipemia   . Hypothyroidism   . Mental disorder   . Myocardial infarction (HCC)   . Pneumoperitoneum of unknown etiology 12/21/2011  . Quadriplegia (HCC)   . Reflux   . Seizure disorder (HCC)   . Weakness of both legs     Patient Active Problem List   Diagnosis Date Noted  . PEG Gastrostomy tube in place (HCC) 01/26/2018  . 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 01/26/2018 01/26/2018  . Bedridden 01/26/2018  . Quadriplegia (HCC)   . Abdominal wall abscess 01/25/2018  . Fracture of humerus anatomical neck, right, closed, initial encounter 10/03/2017  . AKI (acute kidney injury) (HCC) 10/03/2017  . UTI (urinary tract infection) 10/03/2017  . Agitation 07/03/2014  . Cardiomyopathy, ischemic 06/18/2014  . 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 Acute psychosis (HCC) 04/19/2014  . Unspecified hypothyroidism 05/16/2013  . Constipation 05/16/2013  . Anemia of other chronic disease 05/16/2013  . Mental disorder 01/03/2013  . Addisons disease (HCC) 01/03/2013  . PEG (percutaneous endoscopic gastrostomy) adjustment/replacement/removal (HCC) 12/21/2011  .  Hypokalemia 11/25/2011  . Thrombocytopenia (HCC) 11/25/2011  . FTT (failure to thrive) in adult 11/25/2011  . Grand mal seizure (HCC) 11/23/2011  . Depression (emotion) 11/23/2011  . History of atrial fibrillation 11/23/2011  . Other specified hemorrhagic conditions (HCC) 11/23/2011  . Glaucoma 11/23/2011  . Hypoglycemia 11/23/2011  . Chronic constipation 11/23/2011  . Anoxic brain injury (HCC) 11/12/2011  . Protein calorie malnutrition (HCC) 11/12/2011  . Hyperchloremia 11/12/2011  . Hyperthyroidism 11/12/2011  . Acute lower UTI 11/12/2011  . Dysphagia     Past Surgical History:  Procedure Laterality Date  . DEBRIDEMENT OF ABDOMINAL WALL ABSCESS  01/26/2018  . HERNIA MESH REMOVAL  01/26/2018   Infected mesh - removed w I&D abd wall abscess  . IR GENERIC HISTORICAL  10/20/2016   IR REPLC GASTRO/COLONIC TUBE PERCUT W/FLUORO 10/20/2016 Oley Balm, MD [facility]  . IR REPLACE G-TUBE SIMPLE WO FLUORO  01/28/2018  . IR REPLC GASTRO/COLONIC TUBE PERCUT W/FLUORO  04/21/2017  . IR REPLC GASTRO/COLONIC TUBE PERCUT W/FLUORO  07/22/2017  . IR REPLC GASTRO/COLONIC TUBE PERCUT W/FLUORO  11/24/2017  . IR REPLC GASTRO/COLONIC TUBE PERCUT W/FLUORO  08/12/2020  . IRRIGATION AND DEBRIDEMENT ABSCESS N/A 01/26/2018   Procedure: IRRIGATION AND DEBRIDEMENT ABDOMINAL WALL ABSCESS WITH REMOVAL OF INFECTED MESH;  Surgeon: Karie Soda, MD;  Location: [facility];  Service: General;  Laterality: N/A;  . NEPHRECTOMY  unknown  . PEG TUBE PLACEMENT  No family history on file.  Social History   Tobacco Use  . Smoking status: Never Smoker  . Smokeless tobacco: Never Used  Vaping Use  . Vaping Use: Never used  Substance Use Topics  . Alcohol use: No  . Drug use: No    Home Medications Prior to Admission medications   Medication Sig Start Date End Date Taking? Authorizing Provider  acetaminophen (TYLENOL) 325 MG tablet Place 650 mg into feeding tube every 4 (four) hours as needed for mild  pain.    [provider]  Amino Acids-Protein Hydrolys (FEEDING SUPPLEMENT, PRO-STAT SUGAR FREE 64,) LIQD Place 30 mLs into feeding tube 2 (two) times daily.     [provider]  aspirin 81 MG chewable tablet Chew 81 mg by mouth daily.    [provider]  Carboxymeth-Glycerin-Polysorb (REFRESH OPTIVE ADVANCED OP) Place 1 drop into both eyes 3 (three) times daily.    [provider]  clonazePAM (KLONOPIN) 0.5 MG tablet Take 0.5 mg by mouth at bedtime.    [provider]  diclofenac Sodium (VOLTAREN) 1 % GEL Apply 4 g topically 4 (four) times daily as needed. 05/29/20   Hilts, Casimiro Needle, MD  divalproex (DEPAKOTE SPRINKLE) 125 MG capsule Take 500 mg by mouth 2 (two) times daily.  07/18/13   [provider]  levothyroxine (SYNTHROID, LEVOTHROID) 150 MCG tablet Place 150 mcg into feeding tube daily before breakfast.     [provider]  metoprolol tartrate (LOPRESSOR) 25 MG tablet Take 12.5 mg by mouth 2 (two) times daily.    [provider]  Multiple Vitamins-Minerals (CERTAVITE/ANTIOXIDANTS) LIQD 15 mLs by PEG Tube route daily at 12 noon.     [provider]  pantoprazole sodium (PROTONIX) 40 mg/20 mL PACK 40 mg by PEG Tube route daily at 12 noon.  11/30/11   [name], MD  PARoxetine (PAXIL) 10 MG tablet Take 10 mg by mouth daily.    [provider]  polyethylene glycol (MIRALAX / GLYCOLAX) packet 17 g by PEG Tube route 2 (two) times daily. Mix 17gm  In 4-8 ounces of liquid    [provider]  polyvinyl alcohol (LIQUIFILM TEARS) 1.4 % ophthalmic solution Place 1 drop into both eyes 2 (two) times daily.    [provider]  risperiDONE (RISPERDAL) 1 MG tablet Take 1 mg by mouth in the morning, at noon, in the evening, and at bedtime. 9 AM, 1 PM, 5 PM, 9 PM    [provider]  sennosides (SENOKOT) 8.8 MG/5ML syrup Place 15 mLs into feeding tube 2 (two) times daily.     [provider]  traZODone (DESYREL) 150 MG tablet Take 75 mg by mouth at bedtime.  08/02/13   [provider]    Allergies    Ativan [lorazepam], Latex, Morphine and related, Penicillins, Pyridium [phenazopyridine hcl], and Soy allergy  Review of Systems   Review of Systems  Unable to perform ROS: Patient nonverbal    Physical Exam Updated Vital Signs BP 99/68 (BP Location: Left Arm)   Pulse 75   Temp 99.1 F (37.3 C) (Oral)   Resp 17   SpO2 99%   Physical Exam Vitals and nursing note reviewed.  Constitutional:      General: He is not in acute distress.    Appearance: Normal appearance. He is well-developed. He is not toxic-appearing.  HENT:     Head: Normocephalic and atraumatic.  Eyes:     General: Lids are  normal.     Conjunctiva/sclera: Conjunctivae normal.     Pupils: Pupils are equal, round, and reactive to light.  Neck:     Thyroid: No thyroid mass.     Trachea: No tracheal deviation.  Cardiovascular:     Rate and Rhythm: Normal rate and regular rhythm.     Heart sounds: Normal heart sounds. No murmur heard. No gallop.   Pulmonary:     Effort: Pulmonary effort is normal. No respiratory distress.     Breath sounds: Normal breath sounds. No stridor. No decreased breath sounds, wheezing, rhonchi or rales.  Abdominal:     General: Bowel sounds are normal. There is no distension.     Palpations: Abdomen is soft.     Tenderness: There is no abdominal tenderness. There is no rebound.    Musculoskeletal:        General: No tenderness. Normal range of motion.     Cervical back: Normal range of motion and neck supple.  Skin:    General: Skin is warm and dry.     Findings: No abrasion or rash.  Neurological:     Mental Status: He is alert. Mental status is at baseline.     GCS: GCS eye subscore is 4. GCS verbal subscore is 5. GCS motor subscore is 6.     Cranial Nerves: Cranial nerves are intact. No cranial nerve deficit.     ED Results / Procedures / Treatments    Labs (all labs ordered are listed, but only abnormal results are displayed) Labs Reviewed - No data to display  EKG None  Radiology No results found.  Procedures FEEDING TUBE REPLACEMENT  Date/Time: 06/01/2021 9:17 PM Performed by: Lorre Nick, MD Authorized by: Lorre Nick, MD  Consent: The procedure was performed in an emergent situation. Verbal consent obtained. Risks and benefits: risks, benefits and alternatives were discussed Required items: required blood products, implants, devices, and special equipment available Time out: Immediately prior to procedure a "time out" was called to verify the correct patient, procedure, equipment, support staff and site/side marked as required. Preparation: Patient was prepped and draped in the usual sterile fashion. Indications: tube dislodged Local anesthesia used: no  Anesthesia: Local anesthesia used: no  Sedation: Patient sedated: no  Tube type: gastrostomy Patient position: supine Procedure type: replacement Tube size: 16 Fr Endoscope used: no Bulb inflation volume: 10 (ml) Bulb inflation fluid: normal saline Placement/position confirmation: x-ray Tube placement difficulty: none Patient tolerance: patient tolerated the procedure well with no immediate complications      Medications Ordered in ED Medications - No data to display  ED Course  I have reviewed the triage vital signs and the nursing notes.  Pertinent labs & imaging results that were available during my care of the patient were reviewed by me and considered in my medical decision making (see chart for details).    MDM Rules/Calculators/A&P                          Post procedure x-ray shows good placement after contrast instilled by radiology.  Will discharge back to facility Final Clinical Impression(s) / ED Diagnoses Final diagnoses:  None    Rx / DC Orders ED Discharge Orders    None       Lorre Nick, MD 06/01/21 2118

## 2021-06-01 NOTE — ED Triage Notes (Signed)
Patient arrives from [facility] after patient pulled out PEG tube. Patient hx of dementia and anoxic brain injury

## 2021-06-02 ENCOUNTER — Emergency Department (HOSPITAL_COMMUNITY): Payer: Medicare Other

## 2021-06-02 ENCOUNTER — Emergency Department (HOSPITAL_COMMUNITY)
Admission: EM | Admit: 2021-06-02 | Discharge: 2021-06-02 | Disposition: A | Discharge status: Home / Self Care | Payer: Medicare Other | Source: Home / Self Care | Attending: Emergency Medicine | Admitting: Emergency Medicine

## 2021-06-02 DIAGNOSIS — T85528A Displacement of other gastrointestinal prosthetic devices, implants and grafts, initial encounter: Secondary | ICD-10-CM

## 2021-06-02 DIAGNOSIS — Z79899 Other long term (current) drug therapy: Secondary | ICD-10-CM | POA: Insufficient documentation

## 2021-06-02 DIAGNOSIS — F0390 Unspecified dementia without behavioral disturbance: Secondary | ICD-10-CM | POA: Insufficient documentation

## 2021-06-02 DIAGNOSIS — E039 Hypothyroidism, unspecified: Secondary | ICD-10-CM | POA: Insufficient documentation

## 2021-06-02 DIAGNOSIS — Z91040 Latex allergy status: Secondary | ICD-10-CM | POA: Insufficient documentation

## 2021-06-02 DIAGNOSIS — K9423 Gastrostomy malfunction: Secondary | ICD-10-CM | POA: Diagnosis not present

## 2021-06-02 DIAGNOSIS — Z7982 Long term (current) use of aspirin: Secondary | ICD-10-CM | POA: Insufficient documentation

## 2021-06-02 NOTE — ED Provider Notes (Signed)
[facility]-EMERGENCY DEPT Provider Note   CSN: [id number] Arrival date & time: 06/02/21  0734     History Chief Complaint  Patient presents with  . GI Problem    [name] is a 55 y.o. male.  Patient sent in from [facility] nursing facility.  For G-tube placement.  Patient was seen yesterday had 92 [place] G-tube placed that had fallen out.  Apparently patient pulled it out sometime during the night.  Past medical history is significant for dementia anoxic brain injury Addison disease dysphagia.        Past Medical History:  Diagnosis Date  . Addison disease (HCC)   . Anoxic brain injury (HCC)    TBI  . Anxiety   . Aspiration pneumonia (HCC)   . Atrial fibrillation (HCC)   . Chronic constipation 11/23/2011  . Dementia (HCC)   . Dysphagia   . Encephalopathy   . Frontal lobe syndrome   . Gait abnormality    freq falls  . Glaucoma   . Glucocorticoid deficiency (HCC)   . History of heart attack   . History of recurrent UTIs   . Hyperlipemia   . Hypothyroidism   . Mental disorder   . Myocardial infarction (HCC)   . Pneumoperitoneum of unknown etiology 12/21/2011  . Quadriplegia (HCC)   . Reflux   . Seizure disorder (HCC)   . Weakness of both legs     Patient Active Problem List   Diagnosis Date Noted  . PEG Gastrostomy tube in place (HCC) 01/26/2018  . 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 01/26/2018 01/26/2018  . Bedridden 01/26/2018  . Quadriplegia (HCC)   . Abdominal wall abscess 01/25/2018  . Fracture of humerus anatomical neck, right, closed, initial encounter 10/03/2017  . AKI (acute kidney injury) (HCC) 10/03/2017  . UTI (urinary tract infection) 10/03/2017  . Agitation 07/03/2014  . Cardiomyopathy, ischemic 06/18/2014  . 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 Acute psychosis (HCC) 04/19/2014  . Unspecified hypothyroidism 05/16/2013  . Constipation 05/16/2013  . Anemia of other chronic disease 05/16/2013  .  Mental disorder 01/03/2013  . Addisons disease (HCC) 01/03/2013  . PEG (percutaneous endoscopic gastrostomy) adjustment/replacement/removal (HCC) 12/21/2011  . Hypokalemia 11/25/2011  . Thrombocytopenia (HCC) 11/25/2011  . FTT (failure to thrive) in adult 11/25/2011  . Grand mal seizure (HCC) 11/23/2011  . Depression (emotion) 11/23/2011  . History of atrial fibrillation 11/23/2011  . Other specified hemorrhagic conditions (HCC) 11/23/2011  . Glaucoma 11/23/2011  . Hypoglycemia 11/23/2011  . Chronic constipation 11/23/2011  . Anoxic brain injury (HCC) 11/12/2011  . Protein calorie malnutrition (HCC) 11/12/2011  . Hyperchloremia 11/12/2011  . Hyperthyroidism 11/12/2011  . Acute lower UTI 11/12/2011  . Dysphagia     Past Surgical History:  Procedure Laterality Date  . DEBRIDEMENT OF ABDOMINAL WALL ABSCESS  01/26/2018  . HERNIA MESH REMOVAL  01/26/2018   Infected mesh - removed w I&D abd wall abscess  . IR GENERIC HISTORICAL  10/20/2016   IR REPLC GASTRO/COLONIC TUBE PERCUT W/FLUORO 10/20/2016 Oley Balm, MD [facility]  . IR REPLACE G-TUBE SIMPLE WO FLUORO  01/28/2018  . IR REPLC GASTRO/COLONIC TUBE PERCUT W/FLUORO  04/21/2017  . IR REPLC GASTRO/COLONIC TUBE PERCUT W/FLUORO  07/22/2017  . IR REPLC GASTRO/COLONIC TUBE PERCUT W/FLUORO  11/24/2017  . IR REPLC GASTRO/COLONIC TUBE PERCUT W/FLUORO  08/12/2020  . IRRIGATION AND DEBRIDEMENT ABSCESS N/A 01/26/2018   Procedure: IRRIGATION AND DEBRIDEMENT ABDOMINAL WALL ABSCESS WITH REMOVAL OF INFECTED MESH;  Surgeon: Michaell Cowing,  Viviann Spare, MD;  Location: [facility];  Service: General;  Laterality: N/A;  . NEPHRECTOMY  unknown  . PEG TUBE PLACEMENT         No family history on file.  Social History   Tobacco Use  . Smoking status: Never Smoker  . Smokeless tobacco: Never Used  Vaping Use  . Vaping Use: Never used  Substance Use Topics  . Alcohol use: No  . Drug use: No    Home Medications Prior to Admission medications   Medication  Sig Start Date End Date Taking? Authorizing Provider  acetaminophen (TYLENOL) 325 MG tablet Place 650 mg into feeding tube every 4 (four) hours as needed for mild pain.    [provider]  Amino Acids-Protein Hydrolys (FEEDING SUPPLEMENT, PRO-STAT SUGAR FREE 64,) LIQD Place 30 mLs into feeding tube 2 (two) times daily.     [provider]  aspirin 81 MG chewable tablet Chew 81 mg by mouth daily.    [provider]  Carboxymeth-Glycerin-Polysorb (REFRESH OPTIVE ADVANCED OP) Place 1 drop into both eyes 3 (three) times daily.    [provider]  clonazePAM (KLONOPIN) 0.5 MG tablet Take 0.5 mg by mouth at bedtime.    [provider]  diclofenac Sodium (VOLTAREN) 1 % GEL Apply 4 g topically 4 (four) times daily as needed. 05/29/20   Hilts, Casimiro Needle, MD  divalproex (DEPAKOTE SPRINKLE) 125 MG capsule Take 500 mg by mouth 2 (two) times daily.  07/18/13   [provider]  levothyroxine (SYNTHROID, LEVOTHROID) 150 MCG tablet Place 150 mcg into feeding tube daily before breakfast.     [provider]  metoprolol tartrate (LOPRESSOR) 25 MG tablet Take 12.5 mg by mouth 2 (two) times daily.    [provider]  Multiple Vitamins-Minerals (CERTAVITE/ANTIOXIDANTS) LIQD 15 mLs by PEG Tube route daily at 12 noon.     [provider]  pantoprazole sodium (PROTONIX) 40 mg/20 mL PACK 40 mg by PEG Tube route daily at 12 noon.  11/30/11   [name], MD  PARoxetine (PAXIL) 10 MG tablet Take 10 mg by mouth daily.    [provider]  polyethylene glycol (MIRALAX / GLYCOLAX) packet 17 g by PEG Tube route 2 (two) times daily. Mix 17gm  In 4-8 ounces of liquid    [provider]  polyvinyl alcohol (LIQUIFILM TEARS) 1.4 % ophthalmic solution Place 1 drop into both eyes 2 (two) times daily.    [provider]  risperiDONE (RISPERDAL) 1 MG tablet Take 1 mg by mouth in the morning, at noon, in the evening, and at bedtime. 9 AM,  1 PM, 5 PM, 9 PM    [provider]  sennosides (SENOKOT) 8.8 MG/5ML syrup Place 15 mLs into feeding tube 2 (two) times daily.     [provider]  traZODone (DESYREL) 150 MG tablet Take 75 mg by mouth at bedtime.  08/02/13   [provider]    Allergies    Ativan [lorazepam], Latex, Morphine and related, Penicillins, Pyridium [phenazopyridine hcl], and Soy allergy  Review of Systems   Review of Systems  Unable to perform ROS: Dementia    Physical Exam Updated Vital Signs BP (!) 100/53   Pulse (!) 53   Temp 97.6 F (36.4 C) (Oral)   Resp 18   SpO2 100%   Physical Exam Vitals and nursing note reviewed.  Constitutional:      Appearance: He is well-developed.  HENT:     Head: Normocephalic  and atraumatic.  Cardiovascular:     Rate and Rhythm: Normal rate and regular rhythm.     Heart sounds: No murmur heard.   Pulmonary:     Effort: Pulmonary effort is normal. No respiratory distress.     Breath sounds: Normal breath sounds.  Abdominal:     Palpations: Abdomen is soft.     Tenderness: There is no abdominal tenderness.     Comments: Chronic appearing opening where the G-tube was with a skin tag..  Patient also has some well-healed surgical scars on the abdomen as well.  No evidence of any skin infection.  Musculoskeletal:        General: Normal range of motion.     Cervical back: Neck supple.  Skin:    General: Skin is warm and dry.  Neurological:     Mental Status: He is alert. Mental status is at baseline.     ED Results / Procedures / Treatments   Labs (all labs ordered are listed, but only abnormal results are displayed) Labs Reviewed - No data to display  EKG None  Radiology DG Abdomen 1 View  Result Date: 06/01/2021 CLINICAL DATA:  Feeding tube placement. EXAM: ABDOMEN - 1 VIEW COMPARISON:  December 20, 2018 FINDINGS: The bowel gas pattern is normal. A percutaneous gastrostomy tube is seen. Its distal tip and insufflator bulb are  seen within the body of the stomach. Radiopaque contrast is also seen within the distal aspect of the gastrostomy tube and gastric lumen. There is no evidence of contrast extravasation. Radiopaque surgical clips are seen within the right upper quadrant. IMPRESSION: Percutaneous gastrostomy tube positioned within the stomach. Electronically Signed   By: Aram Candela M.D.   On: 06/01/2021 21:14    Procedures Gastrostomy tube replacement  Date/Time: 06/02/2021 8:57 AM Performed by: Vanetta Mulders, MD Authorized by: Vanetta Mulders, MD  Consent: Verbal consent obtained. Written consent not obtained. Patient identity confirmed: arm band Local anesthesia used: no  Anesthesia: Local anesthesia used: no  Sedation: Patient sedated: no  Patient tolerance: patient tolerated the procedure well with no immediate complications Comments: 71 French right angled G-tube placed without any difficulties.  Balloon inflated with 6 cc of air.  Pulled back it did catch.  Gastrografin study will be done for confirmation.      Medications Ordered in ED Medications - No data to display  ED Course  I have reviewed the triage vital signs and the nursing notes.  Pertinent labs & imaging results that were available during my care of the patient were reviewed by me and considered in my medical decision making (see chart for details).    MDM Rules/Calculators/A&P                         18 French T-tube tube easily replaced balloon inflated with 6 cc of air.  Pulled back and I did catch.  Gastrografin study will be done for confirmation.  Gastrografin study shows that tube is in the stomach.  Patient cleared to go back to nursing facility.  We put a bulky dressing on top of it to help prevent him from pulling it out.  Final Clinical Impression(s) / ED Diagnoses Final diagnoses:  Dislodged gastrostomy tube    Rx / DC Orders ED Discharge Orders    None       Vanetta Mulders, MD 06/02/21  1053

## 2021-06-02 NOTE — Discharge Instructions (Addendum)
G-tube replaced.  With an 19 French tube.  Placement confirmed with Gastrografin study.

## 2021-06-02 NOTE — ED Triage Notes (Signed)
Pt coming from [facility] for G tube placement. Staff states pt pulled out his G tube sometime during the night.

## 2021-06-11 IMAGING — CT CT MAXILLOFACIAL W/O CM
3 series · 15 of 47 positions shown, 18 images · non-contrast
Comparison: CT head, max face, C-spine 01/22/2021.

CLINICAL DATA: Facial trauma. Unwitnessed fall, laceration above
left eye with bruising, hx of dementia, anoxic brain injury,

EXAM:
CT HEAD WITHOUT CONTRAST
CT MAXILLOFACIAL WITHOUT CONTRAST
CT CERVICAL SPINE WITHOUT CONTRAST
TECHNIQUE: Multidetector CT imaging of the head, cervical spine, and
maxillofacial structures were performed using the standard protocol
without intravenous contrast. Multiplanar CT image reconstructions
of the cervical spine and maxillofacial structures were also
generated.

[Series 3: max soft · axial · 0.34mm/px · z∈[+1266,+1410]mm · 9 of 84 slices shown, 12 images]
[im 6/84  brain]
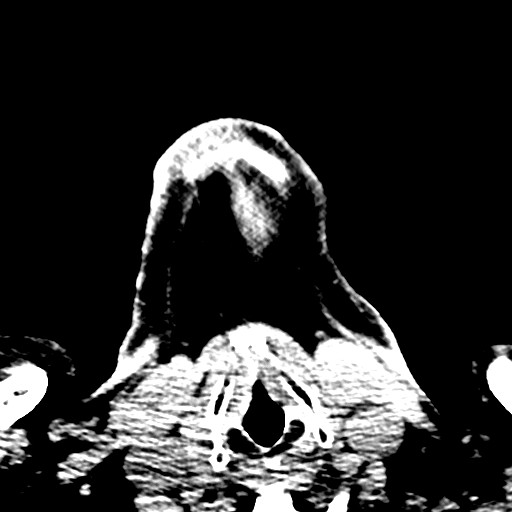
[im 6/84  bone]
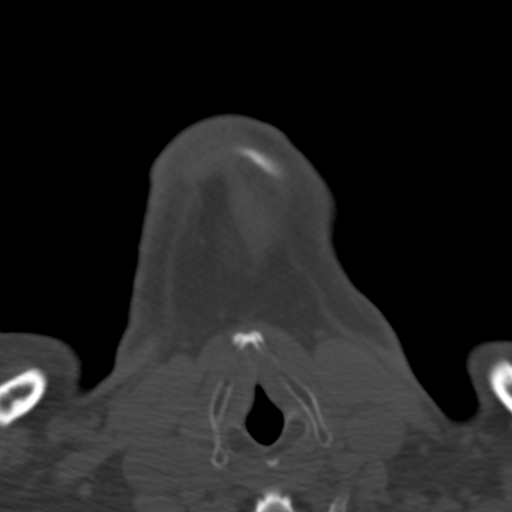
[im 15/84  bone]
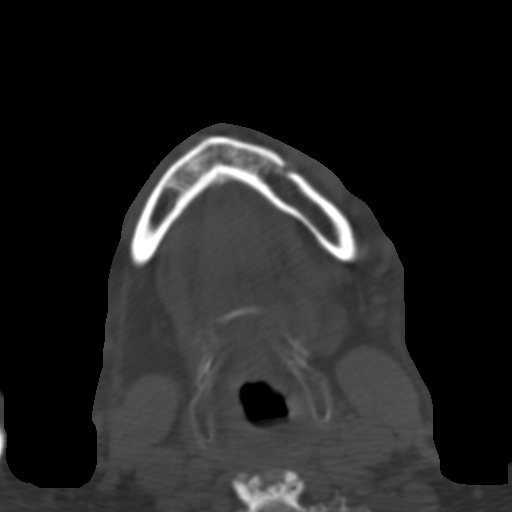
[im 23/84  bone]
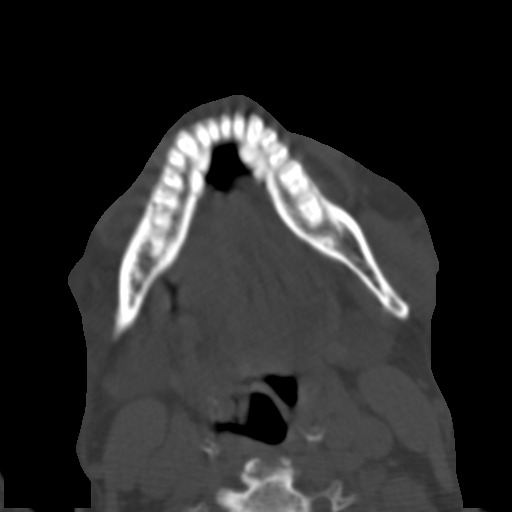
[im 32/84  bone]
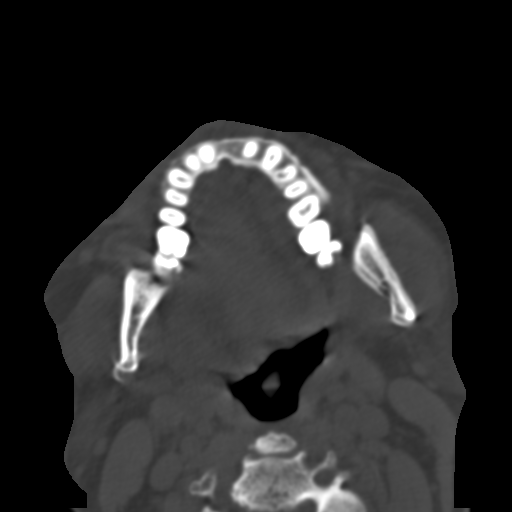
[im 43/84  brain]
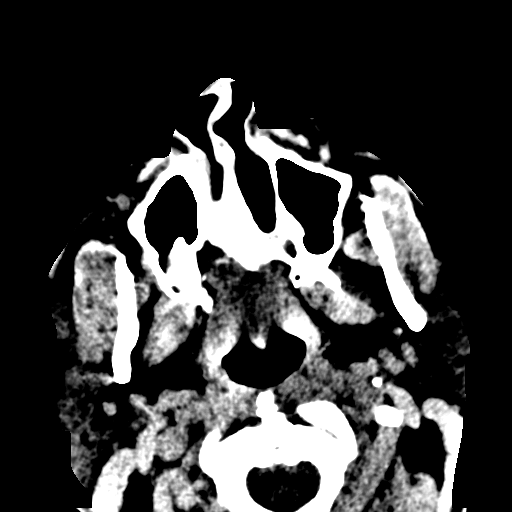
[im 43/84  bone]
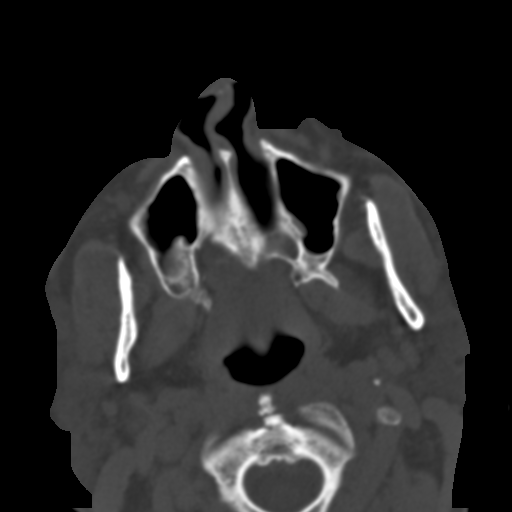
[im 52/84  bone]
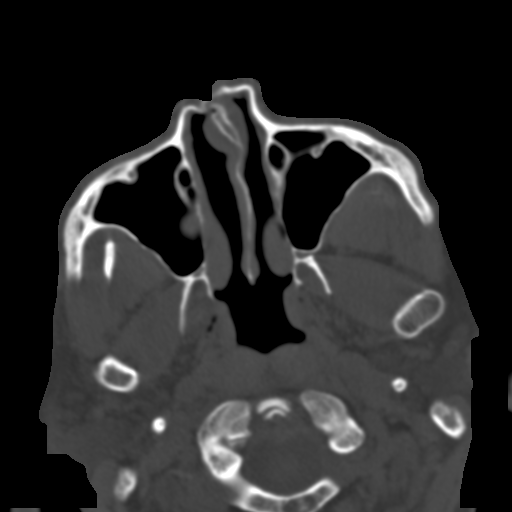
[im 61/84  bone]
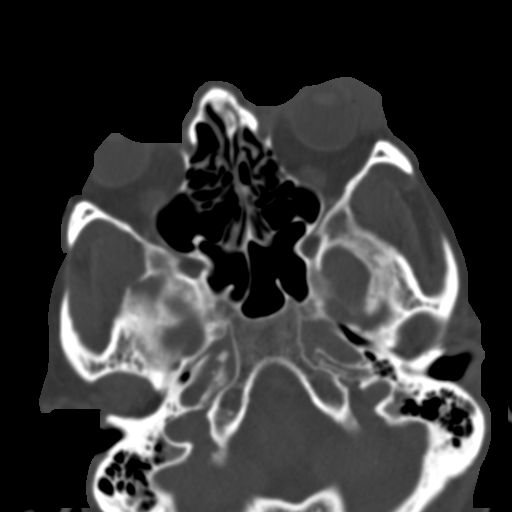
[im 69/84  bone]
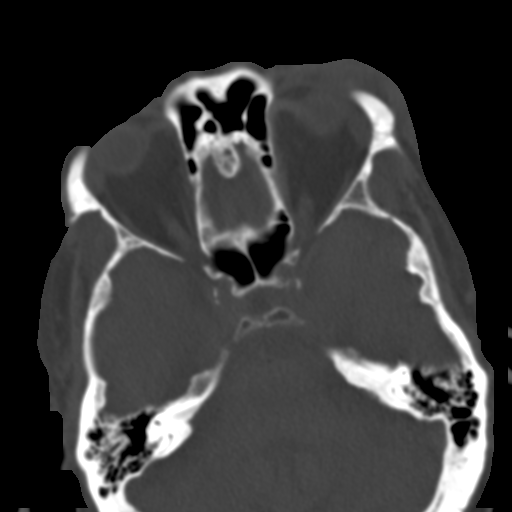
[im 78/84  brain]
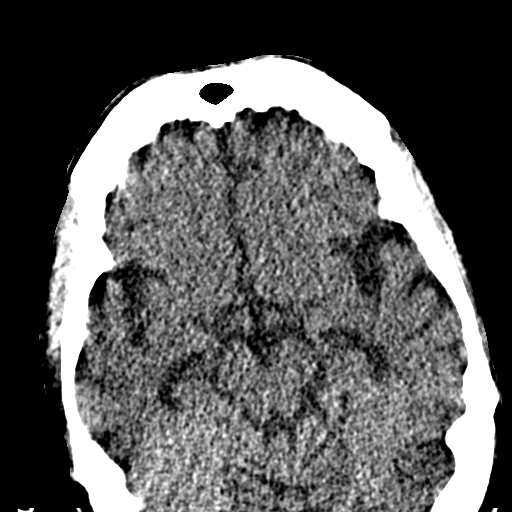
[im 78/84  bone]
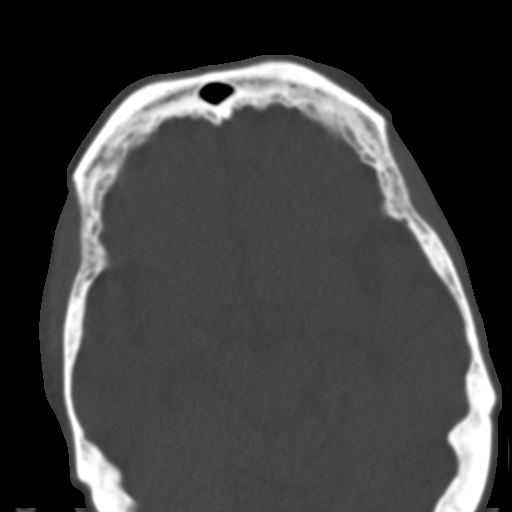

[Series 5: coronal soft · coronal · 0.30mm/px · 3 of 79 slices shown]
[im 27/79  bone]
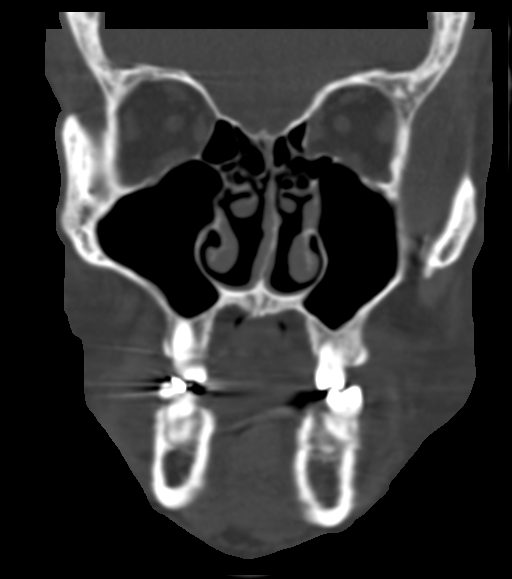
[im 35/79  bone]
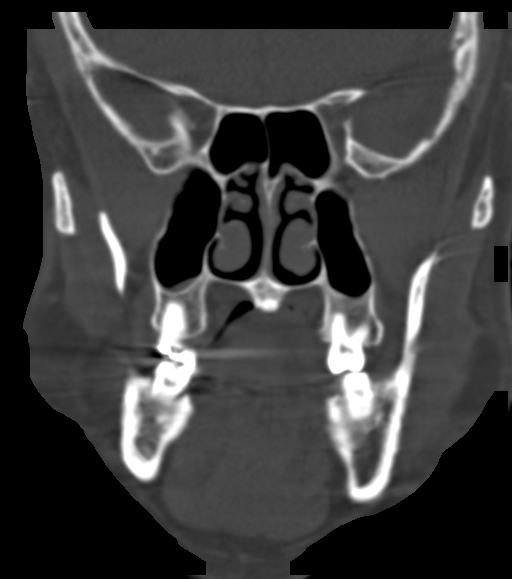
[im 44/79  bone]
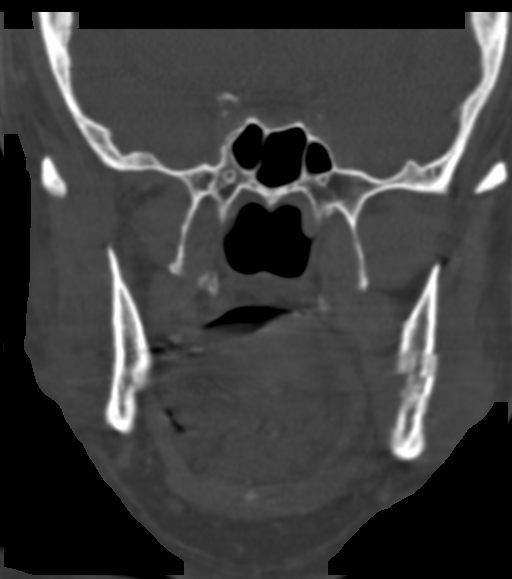

[Series 6: sagittal soft · sagittal · 0.34mm/px · 3 of 81 slices shown]
[im 27/81  bone]
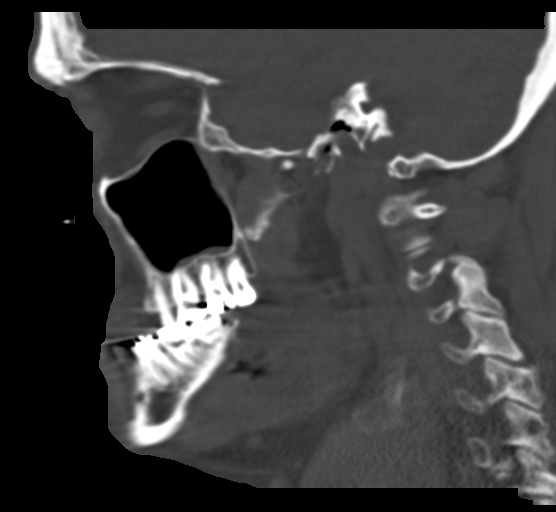
[im 41/81  bone]
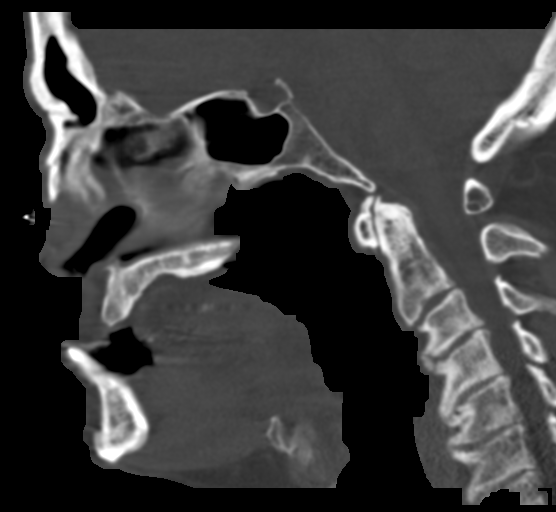
[im 54/81  bone]
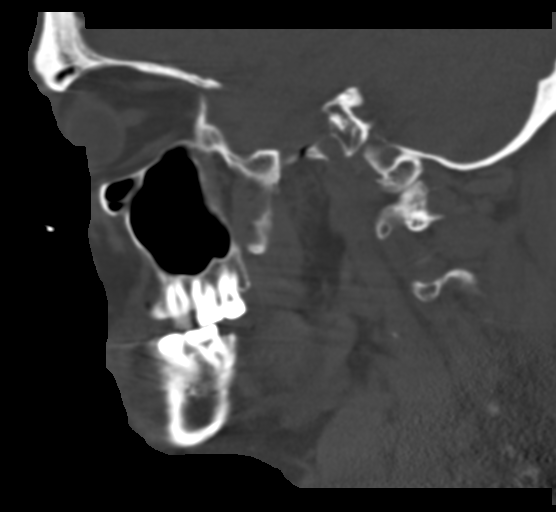

[15 of 47 positions shown; findings below may reference images not displayed]

FINDINGS: CT HEAD FINDINGS

Brain:

Cerebral ventricle sizes are concordant with the degree of cerebral
volume loss.

No evidence of large-territorial acute infarction. No parenchymal
hemorrhage. No mass lesion. No extra-axial collection.

No mass effect or midline shift. No hydrocephalus. Basilar cisterns
are patent.

Empty sella. Findings is often a normal anatomic variant but can be
associated with idiopathic intracranial hypertension (pseudotumor
cerebri).

Vascular: No hyperdense vessel.

Skull: No acute fracture or focal lesion.

Other: None.

CT MAXILLOFACIAL FINDINGS

Osseous: Slightly limited evaluation of the mandible due to motion
artifact. No fracture or mandibular dislocation. No destructive
process. Old healed nasal bone fracture. Old left lamina papyracea
fracture. Multiple missing teeth.

Sinuses/Orbits: Paranasal sinuses and mastoid air cells are clear.
The orbits are unremarkable.

Soft tissues: Left periorbital soft tissue edema/hematoma
formation.

CT CERVICAL SPINE FINDINGS

Alignment: Reversal normal cervical lordosis centered at the C5
level likely due to degenerative changes and positioning.
Similar-appearing grade 1 anterolisthesis of C2 on C3.

Skull base and vertebrae: Multilevel severe degenerative changes of
the spine. No acute fracture. No aggressive appearing focal osseous
lesion or focal pathologic process.

Soft tissues and spinal canal: No prevertebral fluid or swelling. No
visible canal hematoma.

Upper chest: Unremarkable.

Other: None.
IMPRESSION: 1. No acute intracranial abnormality.
2. No acute displaced facial fracture.
3. No acute displaced fracture or traumatic listhesis of the
cervical spine.

## 2021-06-11 IMAGING — CT CT HEAD W/O CM
3 series · 15 of 47 positions shown, 18 images · non-contrast
Comparison: CT head, max face, C-spine 01/22/2021.

CLINICAL DATA: Facial trauma. Unwitnessed fall, laceration above
left eye with bruising, hx of dementia, anoxic brain injury,

EXAM:
CT HEAD WITHOUT CONTRAST
CT MAXILLOFACIAL WITHOUT CONTRAST
CT CERVICAL SPINE WITHOUT CONTRAST
TECHNIQUE: Multidetector CT imaging of the head, cervical spine, and
maxillofacial structures were performed using the standard protocol
without intravenous contrast. Multiplanar CT image reconstructions
of the cervical spine and maxillofacial structures were also
generated.

[Series 3: head wo · axial · 0.47mm/px · z∈[+1383,+1513]mm · 9 of 32 slices shown, 12 images]
[im 3/32  brain]
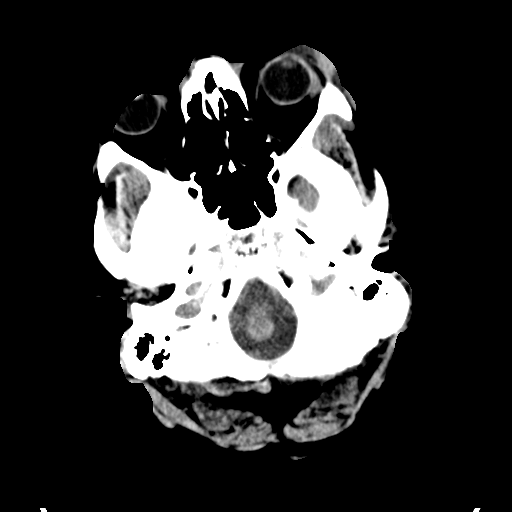
[im 3/32  bone]
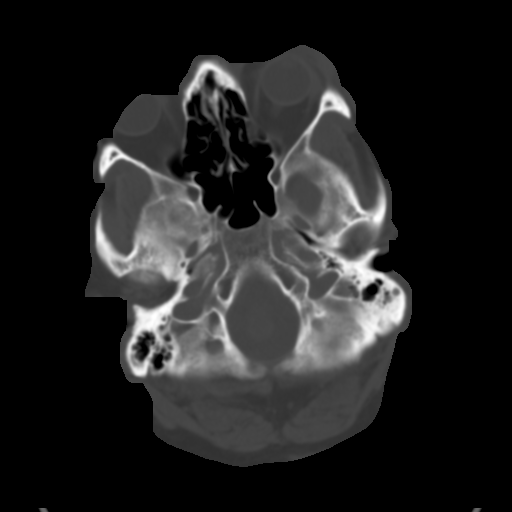
[im 6/32  brain]
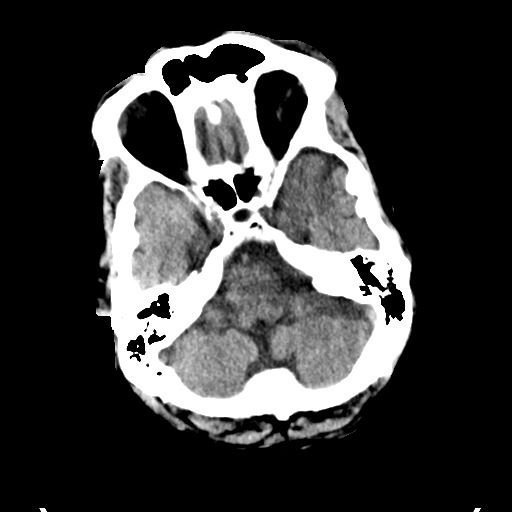
[im 9/32  brain]
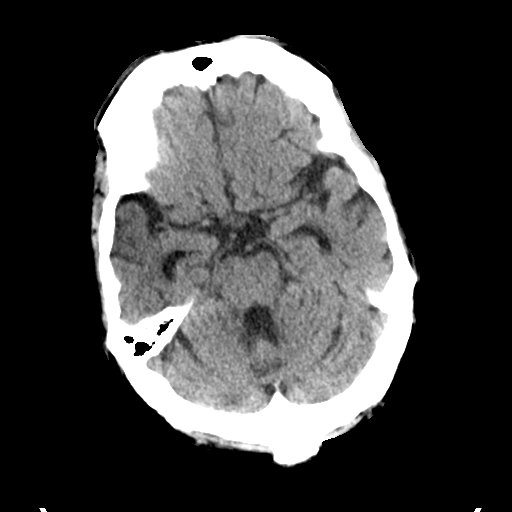
[im 12/32  brain]
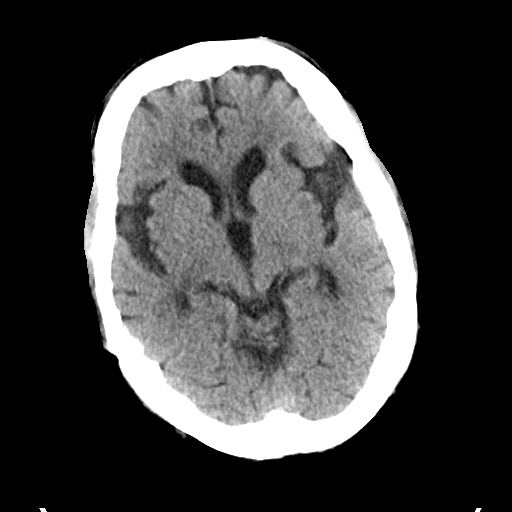
[im 17/32  brain]
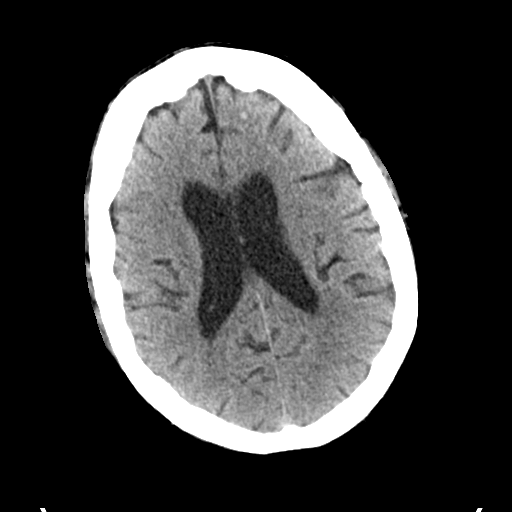
[im 17/32  bone]
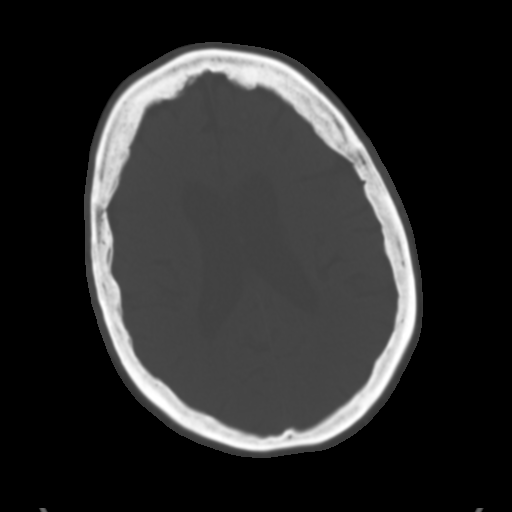
[im 20/32  brain]
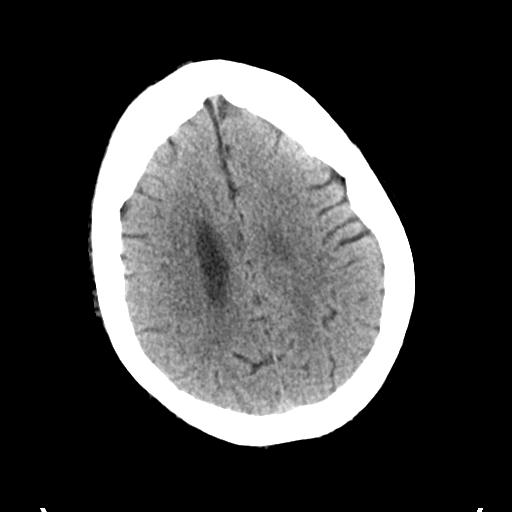
[im 23/32  brain]
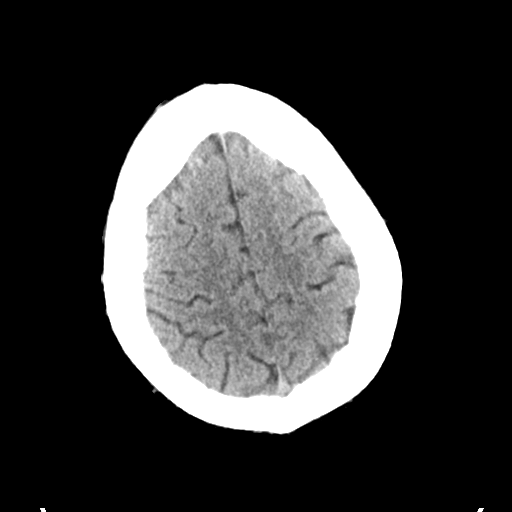
[im 26/32  brain]
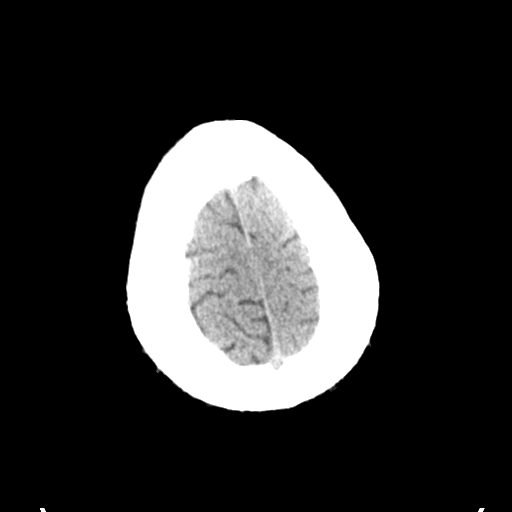
[im 29/32  brain]
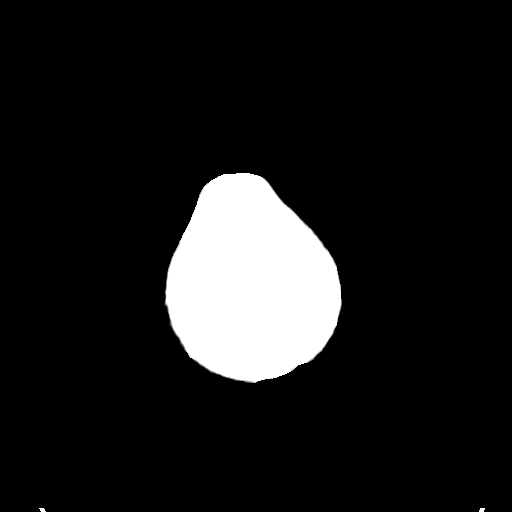
[im 29/32  bone]
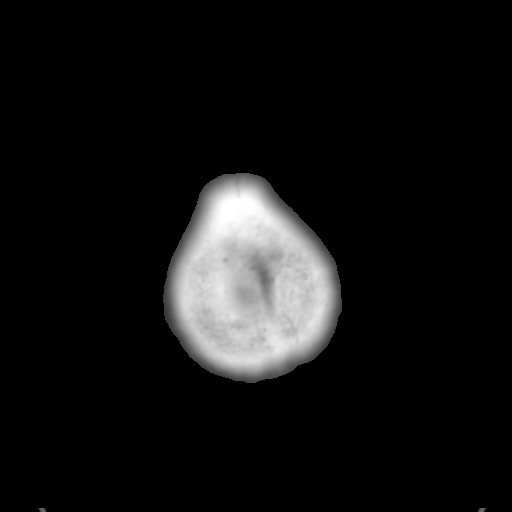

[Series 5: coronal soft tissue · coronal · 0.30mm/px · 3 of 69 slices shown]
[im 23/69  brain]
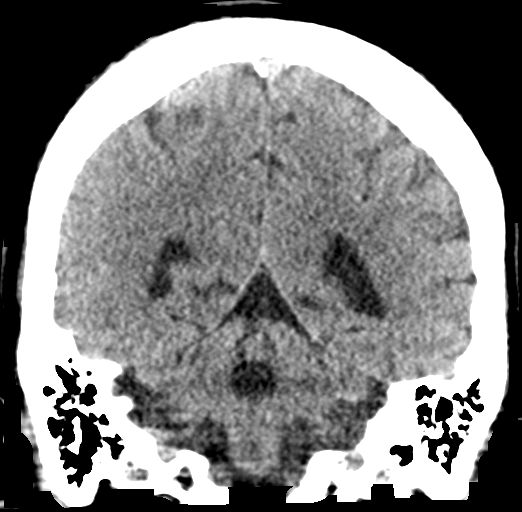
[im 31/69  brain]
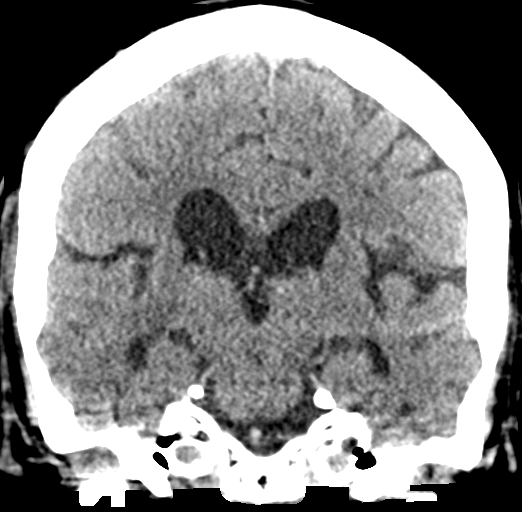
[im 38/69  brain]
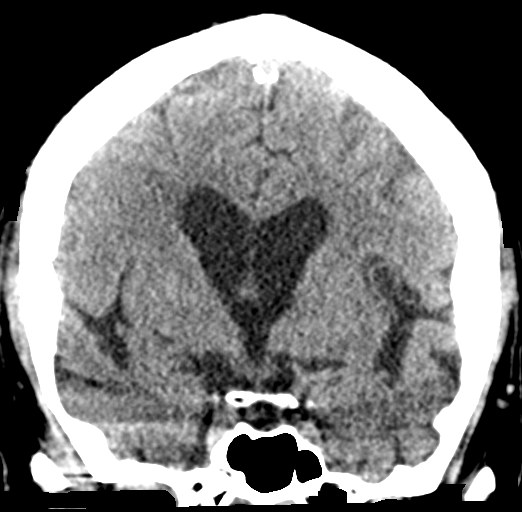

[Series 6: sagittal soft tissue · sagittal · 0.32mm/px · 3 of 54 slices shown]
[im 18/54  brain]
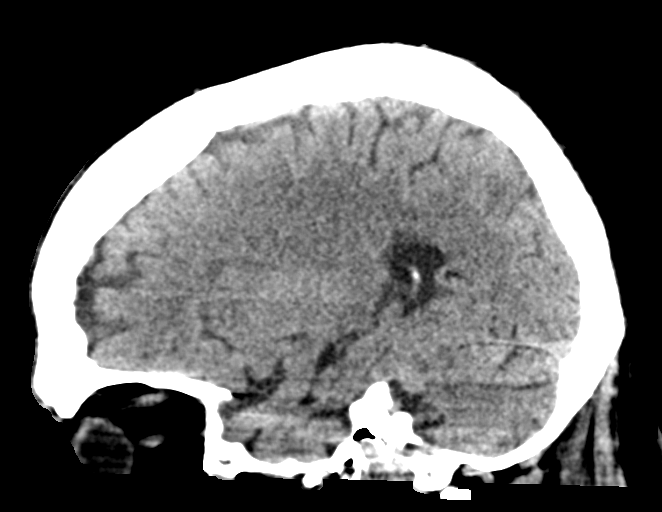
[im 27/54  brain]
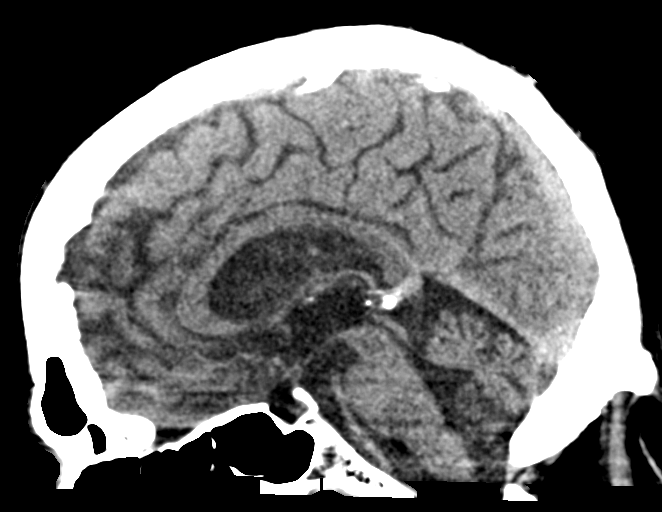
[im 36/54  brain]
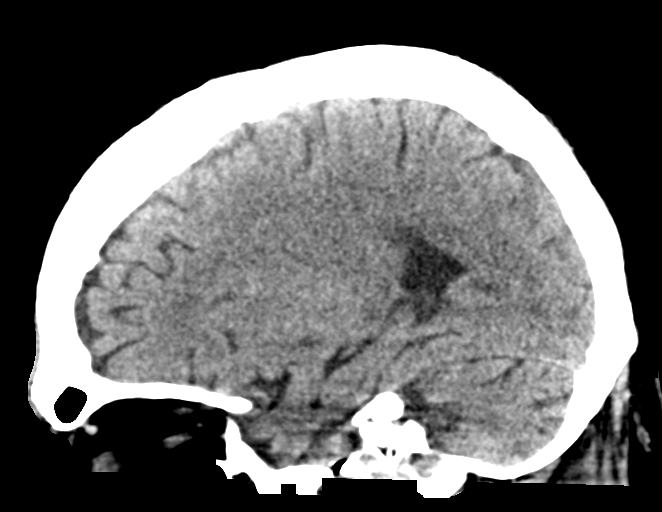

[15 of 47 positions shown; findings below may reference images not displayed]

FINDINGS: CT HEAD FINDINGS

Brain:

Cerebral ventricle sizes are concordant with the degree of cerebral
volume loss.

No evidence of large-territorial acute infarction. No parenchymal
hemorrhage. No mass lesion. No extra-axial collection.

No mass effect or midline shift. No hydrocephalus. Basilar cisterns
are patent.

Empty sella. Findings is often a normal anatomic variant but can be
associated with idiopathic intracranial hypertension (pseudotumor
cerebri).

Vascular: No hyperdense vessel.

Skull: No acute fracture or focal lesion.

Other: None.

CT MAXILLOFACIAL FINDINGS

Osseous: Slightly limited evaluation of the mandible due to motion
artifact. No fracture or mandibular dislocation. No destructive
process. Old healed nasal bone fracture. Old left lamina papyracea
fracture. Multiple missing teeth.

Sinuses/Orbits: Paranasal sinuses and mastoid air cells are clear.
The orbits are unremarkable.

Soft tissues: Left periorbital soft tissue edema/hematoma
formation.

CT CERVICAL SPINE FINDINGS

Alignment: Reversal normal cervical lordosis centered at the C5
level likely due to degenerative changes and positioning.
Similar-appearing grade 1 anterolisthesis of C2 on C3.

Skull base and vertebrae: Multilevel severe degenerative changes of
the spine. No acute fracture. No aggressive appearing focal osseous
lesion or focal pathologic process.

Soft tissues and spinal canal: No prevertebral fluid or swelling. No
visible canal hematoma.

Upper chest: Unremarkable.

Other: None.
IMPRESSION: 1. No acute intracranial abnormality.
2. No acute displaced facial fracture.
3. No acute displaced fracture or traumatic listhesis of the
cervical spine.

## 2021-07-04 IMAGING — CR DG ABDOMEN 1V
1 series · 1 of 1 positions shown · non-contrast
Comparison: December 20, 2018

CLINICAL DATA: Feeding tube placement.

EXAM:
ABDOMEN - 1 VIEW

[x abdomen supine]
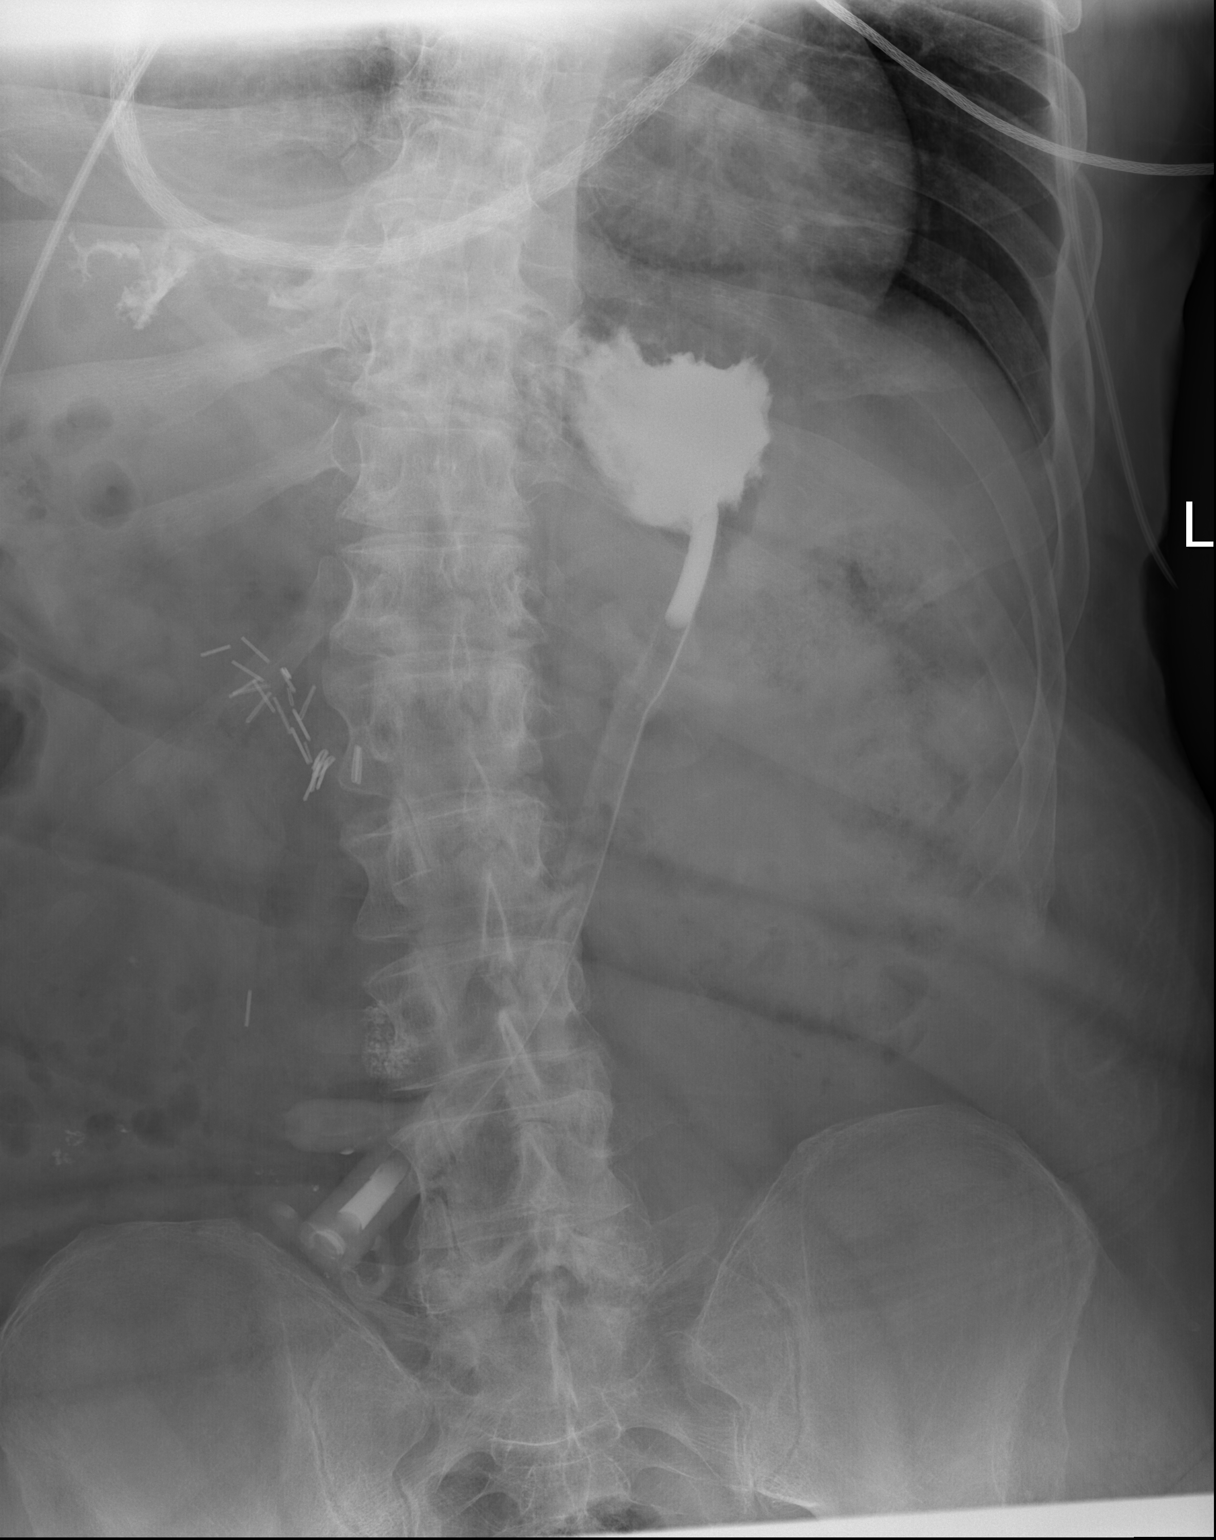

[1 of 1 positions shown; findings below may reference images not displayed]

FINDINGS: The bowel gas pattern is normal. A percutaneous gastrostomy tube is
seen. Its distal tip and insufflator bulb are seen within the body
of the stomach. Radiopaque contrast is also seen within the distal
aspect of the gastrostomy tube and gastric lumen. There is no
evidence of contrast extravasation. Radiopaque surgical clips are
seen within the right upper quadrant.
IMPRESSION: Percutaneous gastrostomy tube positioned within the stomach.

## 2021-07-05 IMAGING — CR DG ABDOMEN 1V
1 series · 1 of 1 positions shown · non-contrast
Comparison: 06/01/2021

CLINICAL DATA: G-tube placement

EXAM:
ABDOMEN - 1 VIEW

[x abdomen supine]
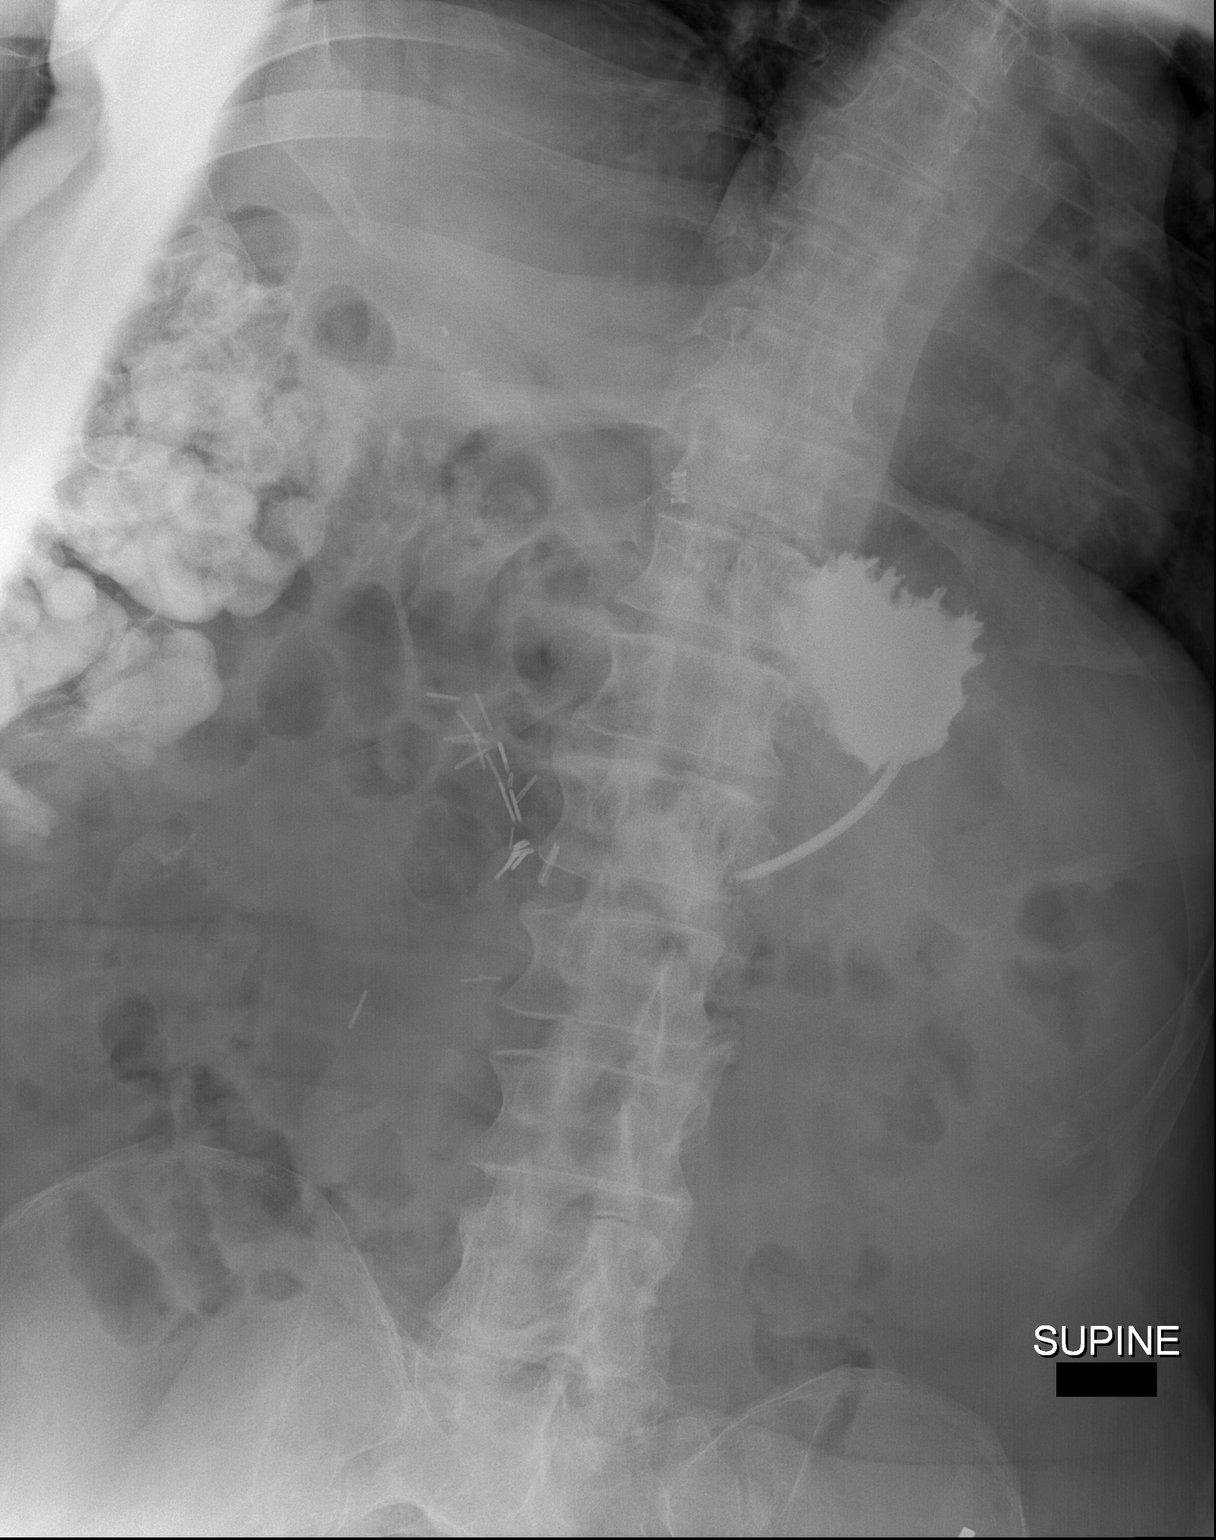

[1 of 1 positions shown; findings below may reference images not displayed]

FINDINGS: Gastrostomy tube was injected with contrast which fills the fundus
of the stomach. No contrast extravasation.
IMPRESSION: Gastrostomy tube within the stomach.

## 2021-07-19 ENCOUNTER — Emergency Department (HOSPITAL_COMMUNITY): Payer: Medicare Other

## 2021-07-19 ENCOUNTER — Emergency Department (HOSPITAL_COMMUNITY)
Admission: EM | Admit: 2021-07-19 | Discharge: 2021-07-20 | Disposition: A | Discharge status: Home / Self Care | Payer: Medicare Other | Attending: Emergency Medicine | Admitting: Emergency Medicine

## 2021-07-19 DIAGNOSIS — Z79899 Other long term (current) drug therapy: Secondary | ICD-10-CM | POA: Diagnosis not present

## 2021-07-19 DIAGNOSIS — W06XXXA Fall from bed, initial encounter: Secondary | ICD-10-CM | POA: Insufficient documentation

## 2021-07-19 DIAGNOSIS — F0390 Unspecified dementia without behavioral disturbance: Secondary | ICD-10-CM | POA: Insufficient documentation

## 2021-07-19 DIAGNOSIS — S0101XA Laceration without foreign body of scalp, initial encounter: Secondary | ICD-10-CM | POA: Insufficient documentation

## 2021-07-19 DIAGNOSIS — Z91040 Latex allergy status: Secondary | ICD-10-CM | POA: Insufficient documentation

## 2021-07-19 DIAGNOSIS — W19XXXA Unspecified fall, initial encounter: Secondary | ICD-10-CM

## 2021-07-19 DIAGNOSIS — E039 Hypothyroidism, unspecified: Secondary | ICD-10-CM | POA: Insufficient documentation

## 2021-07-19 DIAGNOSIS — Z7982 Long term (current) use of aspirin: Secondary | ICD-10-CM | POA: Insufficient documentation

## 2021-07-19 DIAGNOSIS — S0990XA Unspecified injury of head, initial encounter: Secondary | ICD-10-CM | POA: Diagnosis present

## 2021-07-19 DIAGNOSIS — M25561 Pain in right knee: Secondary | ICD-10-CM

## 2021-07-19 DIAGNOSIS — M25562 Pain in left knee: Secondary | ICD-10-CM | POA: Insufficient documentation

## 2021-07-19 MED ORDER — HALOPERIDOL LACTATE 5 MG/ML IJ SOLN
5.0000 mg | Freq: Once | INTRAMUSCULAR | Status: AC
  Administered 2021-07-19: 5 mg via INTRAMUSCULAR
  Filled 2021-07-19: qty 1

## 2021-07-19 MED ORDER — LIDOCAINE-EPINEPHRINE (PF) 2 %-1:200000 IJ SOLN
INTRAMUSCULAR | Status: AC
  Filled 2021-07-19: qty 20

## 2021-07-19 NOTE — Discharge Instructions (Addendum)
You have 3 staples placed in the scalp, these will need to be removed in 7 to 10 days.  You may bathe and wash the scalp as usual.  Tylenol as needed for pain.  Imaging of your head, neck and facial bones as well as x-rays of your knee are all reassuring.

## 2021-07-19 NOTE — ED Triage Notes (Signed)
Pt BIB [facility] from [facility] for a fall from bed.  EMS reports from facility that the pt has periods of sleeplessness which result in wandering and falls.  Pt has been awake for approx. 48 hrs.  Pt has hematoma with lac to back of head and complains of right knee pain.  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

## 2021-07-19 NOTE — ED Notes (Signed)
Patient ambulated to restroom with assistance.

## 2021-07-19 NOTE — ED Notes (Signed)
Patient removed dressing, head re-wrapped.

## 2021-07-19 NOTE — ED Notes (Signed)
Patient continuing to attempt to get out of bed. Patient moved to hallway near nurses station.

## 2021-07-19 NOTE — ED Notes (Signed)
Stapled head wound wrapped with damp 4x4, kerlix and secured with coban

## 2021-07-19 NOTE — ED Notes (Signed)
Patient removed dressing again.

## 2021-07-19 NOTE — ED Notes (Signed)
Patient continuing to attempt to get out of bed. Patient has become verbally abusive towards nursing staff, stating "fuck you", and calling this EMT a "motherfucker" and "bitch".

## 2021-07-19 NOTE — ED Provider Notes (Signed)
[facility] EMERGENCY DEPARTMENT Provider Note   CSN: [id number] Arrival date & time: 07/19/21  1727     History Chief Complaint  Patient presents with   [name]    [name] is a 55 y.o. male.  [name] is a 55 y.o. male with history of previous TBI, dementia, MI, functional quadriplegia, who presents to the emergency department from skilled nursing facility after a fall.  Per facility they think that patient most likely fell from bed, he has history of frequent falls, often when he has not been getting regular sleep he starts to become agitated and tries to get up a lot and often falls.  He fell from the bed in the lowest position and was found on the floor.  Facility reports that he is at his baseline mental status.  They also report that the bruising around his right is chronic and not new from fall today.  Patient with posterior scalp hematoma and laceration with bleeding controlled, patient is not on any anticoagulation.  Patient also complaining of knee pain initially just on the right, but now bilaterally.  Patient denies pain elsewhere.  Has history of dementia but is able to answer most questions, has reported functional quadriplegia but was able to transfer to the stretcher and walk some with EMS.  On chart review patient has been seen many times for similar falls.  No other aggravating or alleviating factors.  The history is provided by the patient, the nursing home and the EMS personnel.      Past Medical History:  Diagnosis Date   Addison disease (HCC)    Anoxic brain injury (HCC)    TBI   Anxiety    Aspiration pneumonia (HCC)    Atrial fibrillation (HCC)    Chronic constipation 11/23/2011   Dementia (HCC)    Dysphagia    Encephalopathy    Frontal lobe syndrome    Gait abnormality    freq falls   Glaucoma    Glucocorticoid deficiency (HCC)    History of heart attack    History of recurrent UTIs    Hyperlipemia    Hypothyroidism     Mental disorder    Myocardial infarction (HCC)    Pneumoperitoneum of unknown etiology 12/21/2011   Quadriplegia (HCC)    Reflux    Seizure disorder (HCC)    Weakness of both legs     Patient Active Problem List   Diagnosis Date Noted   PEG Gastrostomy tube in place (HCC) 01/26/2018   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 01/26/2018 01/26/2018   Bedridden 01/26/2018   Quadriplegia (HCC)    Abdominal wall abscess 01/25/2018   Fracture of humerus anatomical neck, right, closed, initial encounter 10/03/2017   AKI (acute kidney injury) (HCC) 10/03/2017   UTI (urinary tract infection) 10/03/2017   Agitation 07/03/2014   Cardiomyopathy, ischemic 06/18/2014   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   Acute psychosis (HCC) 04/19/2014   Unspecified hypothyroidism 05/16/2013   Constipation 05/16/2013   Anemia of other chronic disease 05/16/2013   Mental disorder 01/03/2013   Addisons disease (HCC) 01/03/2013   PEG (percutaneous endoscopic gastrostomy) adjustment/replacement/removal (HCC) 12/21/2011   Hypokalemia 11/25/2011   Thrombocytopenia (HCC) 11/25/2011   FTT (failure to thrive) in adult 11/25/2011   Grand mal seizure (HCC) 11/23/2011   Depression (emotion) 11/23/2011   History of atrial fibrillation 11/23/2011   Other specified hemorrhagic conditions (HCC) 11/23/2011   Glaucoma 11/23/2011   Hypoglycemia 11/23/2011   Chronic constipation 11/23/2011  Anoxic brain injury (HCC) 11/12/2011   Protein calorie malnutrition (HCC) 11/12/2011   Hyperchloremia 11/12/2011   Hyperthyroidism 11/12/2011   Acute lower UTI 11/12/2011   Dysphagia     Past Surgical History:  Procedure Laterality Date   DEBRIDEMENT OF ABDOMINAL WALL ABSCESS  01/26/2018   HERNIA MESH REMOVAL  01/26/2018   Infected mesh - removed w I&D abd wall abscess   IR GENERIC HISTORICAL  10/20/2016   IR REPLC GASTRO/COLONIC TUBE PERCUT W/FLUORO 10/20/2016 Oley Balm, MD [facility]   IR REPLACE G-TUBE  SIMPLE WO FLUORO  01/28/2018   IR REPLC GASTRO/COLONIC TUBE PERCUT W/FLUORO  04/21/2017   IR REPLC GASTRO/COLONIC TUBE PERCUT W/FLUORO  07/22/2017   IR REPLC GASTRO/COLONIC TUBE PERCUT W/FLUORO  11/24/2017   IR REPLC GASTRO/COLONIC TUBE PERCUT W/FLUORO  08/12/2020   IRRIGATION AND DEBRIDEMENT ABSCESS N/A 01/26/2018   Procedure: IRRIGATION AND DEBRIDEMENT ABDOMINAL WALL ABSCESS WITH REMOVAL OF INFECTED MESH;  Surgeon: Karie Soda, MD;  Location: [facility];  Service: General;  Laterality: N/A;   NEPHRECTOMY  unknown   PEG TUBE PLACEMENT         No family history on file.  Social History   Tobacco Use   Smoking status: Never   Smokeless tobacco: Never  Vaping Use   Vaping Use: Never used  Substance Use Topics   Alcohol use: No   Drug use: No    Home Medications Prior to Admission medications   Medication Sig Start Date End Date Taking? Authorizing Provider  acetaminophen (TYLENOL) 325 MG tablet Place 650 mg into feeding tube every 4 (four) hours as needed for mild pain.    [provider]  Amino Acids-Protein Hydrolys (FEEDING SUPPLEMENT, PRO-STAT SUGAR FREE 64,) LIQD Place 30 mLs into feeding tube 2 (two) times daily.     [provider]  aspirin 81 MG chewable tablet Chew 81 mg by mouth daily.    [provider]  Carboxymeth-Glycerin-Polysorb (REFRESH OPTIVE ADVANCED OP) Place 1 drop into both eyes 3 (three) times daily.    [provider]  clonazePAM (KLONOPIN) 0.5 MG tablet Take 0.5 mg by mouth at bedtime.    [provider]  diclofenac Sodium (VOLTAREN) 1 % GEL Apply 4 g topically 4 (four) times daily as needed. 05/29/20   Hilts, Casimiro Needle, MD  divalproex (DEPAKOTE SPRINKLE) 125 MG capsule Take 500 mg by mouth 2 (two) times daily.  07/18/13   [provider]  levothyroxine (SYNTHROID, LEVOTHROID) 150 MCG tablet Place 150 mcg into feeding tube daily before breakfast.     [provider]  metoprolol tartrate (LOPRESSOR) 25 MG  tablet Take 12.5 mg by mouth 2 (two) times daily.    [provider]  Multiple Vitamins-Minerals (CERTAVITE/ANTIOXIDANTS) LIQD 15 mLs by PEG Tube route daily at 12 noon.     [provider]  pantoprazole sodium (PROTONIX) 40 mg/20 mL PACK 40 mg by PEG Tube route daily at 12 noon.  11/30/11   [name], MD  PARoxetine (PAXIL) 10 MG tablet Take 10 mg by mouth daily.    [provider]  polyethylene glycol (MIRALAX / GLYCOLAX) packet 17 g by PEG Tube route 2 (two) times daily. Mix 17gm  In 4-8 ounces of liquid    [provider]  polyvinyl alcohol (LIQUIFILM TEARS) 1.4 % ophthalmic solution Place 1 drop into both eyes 2 (two) times daily.    [provider]  risperiDONE (RISPERDAL) 1 MG tablet Take 1 mg by mouth in the  morning, at noon, in the evening, and at bedtime. 9 AM, 1 PM, 5 PM, 9 PM    [provider]  sennosides (SENOKOT) 8.8 MG/5ML syrup Place 15 mLs into feeding tube 2 (two) times daily.     [provider]  traZODone (DESYREL) 150 MG tablet Take 75 mg by mouth at bedtime.  08/02/13   [provider]    Allergies    Ativan [lorazepam], Latex, Morphine and related, Penicillins, Pyridium [phenazopyridine hcl], and Soy allergy  Review of Systems   Review of Systems  Unable to perform ROS: Dementia   Physical Exam Updated Vital Signs BP 110/65 (BP Location: Left Arm)   Pulse 70   Temp 98 F (36.7 C)   Resp 15   SpO2 100%   Physical Exam Vitals and nursing note reviewed.  Constitutional:      General: He is not in acute distress.    Appearance: Normal appearance. He is well-developed. He is not diaphoretic.     Comments: Chronically ill appearing, demented at baseline  HENT:     Head: Normocephalic.     Comments: Hematoma over the posterior scalp with 3 cm scalp laceration with no active bleeding, some dried blood in the hair, no step-off or deformity, negative battle sign.    Nose: Nose normal.      Mouth/Throat:     Mouth: Mucous membranes are moist.     Pharynx: Oropharynx is clear.  Eyes:     General:        Right eye: No discharge.        Left eye: No discharge.     Comments: Some ecchymosis surrounding the right eye, reported to be old per nursing facility Pupils equal round and reactive bilaterally with EOMs intact.  Neck:     Comments: No tenderness, normal range of motion Cardiovascular:     Rate and Rhythm: Normal rate and regular rhythm.     Pulses: Normal pulses.     Heart sounds: Normal heart sounds. No murmur heard.   No friction rub. No gallop.  Pulmonary:     Effort: Pulmonary effort is normal. No respiratory distress.     Breath sounds: Normal breath sounds. No wheezing or rales.     Comments: Respirations equal and unlabored, patient able to speak in full sentences, lungs clear to auscultation bilaterally, chest wall nontender to palpation. Chest:     Chest wall: No tenderness.  Abdominal:     General: Bowel sounds are normal. There is no distension.     Palpations: Abdomen is soft. There is no mass.     Tenderness: There is no abdominal tenderness. There is no guarding.     Comments: Abdomen soft, nondistended, nontender to palpation in all quadrants without guarding or peritoneal signs  Musculoskeletal:        General: Tenderness present. No deformity.     Cervical back: Neck supple.     Comments: No midline spinal tenderness. Mild tenderness over bilateral knees, some redness noted to the skin, no swelling or deformity No tenderness over the hips.  All the joints supple and easily movable.  All compartments soft.  Skin:    General: Skin is warm and dry.     Capillary Refill: Capillary refill takes less than 2 seconds.  Neurological:     Mental Status: He is alert and oriented to person, place, and time.     Coordination: Coordination normal.     Comments: Speech is slurred and  tangential at baseline CN III-XII intact Normal strength in upper and lower  extremities bilaterally including dorsiflexion and plantar flexion, strong and equal grip strength Sensation normal to light and sharp touch  Psychiatric:        Mood and Affect: Mood normal.        Behavior: Behavior normal.    ED Results / Procedures / Treatments   Labs (all labs ordered are listed, but only abnormal results are displayed) Labs Reviewed - No data to display  EKG EKG Interpretation  Date/Time:  Saturday July 19 2021 17:37:22 EDT Ventricular Rate:  65 PR Interval:  194 QRS Duration: 89 QT Interval:  411 QTC Calculation: 428 R Axis:   71 Text Interpretation: Sinus rhythm Atrial premature complex No significant change since last tracing Confirmed by Gwyneth Sprout ([id number]) on 07/19/2021 8:01:07 PM  Radiology CT Head Wo Contrast  Result Date: 07/19/2021 CLINICAL DATA:  Head trauma. EXAM: CT HEAD WITHOUT CONTRAST CT MAXILLOFACIAL WITHOUT CONTRAST CT CERVICAL SPINE WITHOUT CONTRAST TECHNIQUE: Multidetector CT imaging of the head, cervical spine, and maxillofacial structures were performed using the standard protocol without intravenous contrast. Multiplanar CT image reconstructions of the cervical spine and maxillofacial structures were also generated. COMPARISON:  05/09/2021. FINDINGS: CT HEAD FINDINGS Brain: No evidence of acute infarction, hemorrhage, hydrocephalus, extra-axial collection or mass lesion/mass effect. Vascular: No hyperdense vessel or unexpected calcification. Skull: Normal. Negative for fracture or focal lesion. Other: Small left parietal scalp hematoma. CT MAXILLOFACIAL FINDINGS Osseous: No acute fracture. Old nasal fractures stable from the study dated 05/09/2021. No bone lesion. Orbits: Negative. No traumatic or inflammatory finding. Sinuses: Essentially clear. Soft tissues: No soft tissue mass. No apparent contusion and no evidence of a hematoma. CT CERVICAL SPINE FINDINGS Alignment: Mild reversal the normal cervical lordosis, apex at C4. No  spondylolisthesis. Skull base and vertebrae: No acute fracture. No primary bone lesion or focal pathologic process. Soft tissues and spinal canal: No prevertebral fluid or swelling. No visible canal hematoma. Disc levels: Mild to moderate loss of disc height, C2-C3 C4-C5 with moderate loss of disc height at C5-C6 and C6-C7. There is endplate sclerosis, osteophytes and disc bulging at these levels. No convincing disc herniation. Upper chest: No acute abnormalities. Other: None. IMPRESSION: HEAD CT 1. No acute intracranial abnormalities. 2. No skull fracture. 3. Small left parietal scalp hematoma. MAXILLOFACIAL CT 1. No acute fracture or acute finding. CERVICAL CT 1. No fracture or acute finding. Electronically Signed   By: Amie Portland M.D.   On: 07/19/2021 19:13   CT Cervical Spine Wo Contrast  Result Date: 07/19/2021 CLINICAL DATA:  Head trauma. EXAM: CT HEAD WITHOUT CONTRAST CT MAXILLOFACIAL WITHOUT CONTRAST CT CERVICAL SPINE WITHOUT CONTRAST TECHNIQUE: Multidetector CT imaging of the head, cervical spine, and maxillofacial structures were performed using the standard protocol without intravenous contrast. Multiplanar CT image reconstructions of the cervical spine and maxillofacial structures were also generated. COMPARISON:  05/09/2021. FINDINGS: CT HEAD FINDINGS Brain: No evidence of acute infarction, hemorrhage, hydrocephalus, extra-axial collection or mass lesion/mass effect. Vascular: No hyperdense vessel or unexpected calcification. Skull: Normal. Negative for fracture or focal lesion. Other: Small left parietal scalp hematoma. CT MAXILLOFACIAL FINDINGS Osseous: No acute fracture. Old nasal fractures stable from the study dated 05/09/2021. No bone lesion. Orbits: Negative. No traumatic or inflammatory finding. Sinuses: Essentially clear. Soft tissues: No soft tissue mass. No apparent contusion and no evidence of a hematoma. CT CERVICAL SPINE FINDINGS Alignment: Mild reversal the normal cervical  lordosis, apex at C4. No spondylolisthesis.  Skull base and vertebrae: No acute fracture. No primary bone lesion or focal pathologic process. Soft tissues and spinal canal: No prevertebral fluid or swelling. No visible canal hematoma. Disc levels: Mild to moderate loss of disc height, C2-C3 C4-C5 with moderate loss of disc height at C5-C6 and C6-C7. There is endplate sclerosis, osteophytes and disc bulging at these levels. No convincing disc herniation. Upper chest: No acute abnormalities. Other: None. IMPRESSION: HEAD CT 1. No acute intracranial abnormalities. 2. No skull fracture. 3. Small left parietal scalp hematoma. MAXILLOFACIAL CT 1. No acute fracture or acute finding. CERVICAL CT 1. No fracture or acute finding. Electronically Signed   By: Amie Portland M.D.   On: 07/19/2021 19:13   DG Knee Complete 4 Views Left  Result Date: 07/19/2021 CLINICAL DATA:  Bilateral knee pain after fall. EXAM: LEFT KNEE - COMPLETE 4+ VIEW COMPARISON:  None. FINDINGS: No evidence of fracture, dislocation, or joint effusion. No evidence of arthropathy or other focal bone abnormality. Soft tissues are unremarkable. IMPRESSION: Negative. Electronically Signed   By: Lupita Raider M.D.   On: 07/19/2021 18:20   DG Knee Complete 4 Views Right  Result Date: 07/19/2021 CLINICAL DATA:  Bilateral knee pain after fall. EXAM: RIGHT KNEE - COMPLETE 4+ VIEW COMPARISON:  None. FINDINGS: No evidence of fracture, dislocation, or joint effusion. Moderate narrowing of medial joint space is noted. Anterior soft tissue swelling is seen in the infrapatellar region. IMPRESSION: Moderate degenerative joint disease is noted medially. No fracture or dislocation is noted. Electronically Signed   By: Lupita Raider M.D.   On: 07/19/2021 18:22   CT Maxillofacial Wo Contrast  Result Date: 07/19/2021 CLINICAL DATA:  Head trauma. EXAM: CT HEAD WITHOUT CONTRAST CT MAXILLOFACIAL WITHOUT CONTRAST CT CERVICAL SPINE WITHOUT CONTRAST TECHNIQUE:  Multidetector CT imaging of the head, cervical spine, and maxillofacial structures were performed using the standard protocol without intravenous contrast. Multiplanar CT image reconstructions of the cervical spine and maxillofacial structures were also generated. COMPARISON:  05/09/2021. FINDINGS: CT HEAD FINDINGS Brain: No evidence of acute infarction, hemorrhage, hydrocephalus, extra-axial collection or mass lesion/mass effect. Vascular: No hyperdense vessel or unexpected calcification. Skull: Normal. Negative for fracture or focal lesion. Other: Small left parietal scalp hematoma. CT MAXILLOFACIAL FINDINGS Osseous: No acute fracture. Old nasal fractures stable from the study dated 05/09/2021. No bone lesion. Orbits: Negative. No traumatic or inflammatory finding. Sinuses: Essentially clear. Soft tissues: No soft tissue mass. No apparent contusion and no evidence of a hematoma. CT CERVICAL SPINE FINDINGS Alignment: Mild reversal the normal cervical lordosis, apex at C4. No spondylolisthesis. Skull base and vertebrae: No acute fracture. No primary bone lesion or focal pathologic process. Soft tissues and spinal canal: No prevertebral fluid or swelling. No visible canal hematoma. Disc levels: Mild to moderate loss of disc height, C2-C3 C4-C5 with moderate loss of disc height at C5-C6 and C6-C7. There is endplate sclerosis, osteophytes and disc bulging at these levels. No convincing disc herniation. Upper chest: No acute abnormalities. Other: None. IMPRESSION: HEAD CT 1. No acute intracranial abnormalities. 2. No skull fracture. 3. Small left parietal scalp hematoma. MAXILLOFACIAL CT 1. No acute fracture or acute finding. CERVICAL CT 1. No fracture or acute finding. Electronically Signed   By: Amie Portland M.D.   On: 07/19/2021 19:13    Procedures .Marland KitchenLaceration Repair  Date/Time: 07/19/2021 11:48 PM Performed by: Dartha Lodge, PA-C Authorized by: Dartha Lodge, PA-C   Consent:    Consent obtained:   Verbal  Consent given by:  Patient   Risks discussed:  Infection, pain, poor cosmetic result, need for additional repair and poor wound healing   Alternatives discussed:  No treatment Universal protocol:    Procedure explained and questions answered to patient or proxy's satisfaction: yes     Patient identity confirmed:  Verbally with patient Anesthesia:    Anesthesia method:  Local infiltration   Local anesthetic:  Lidocaine 2% WITH epi Laceration details:    Location:  Scalp   Scalp location:  Occipital   Length (cm):  3 Exploration:    Hemostasis achieved with:  Direct pressure   Imaging outcome: foreign body not noted     Wound exploration: entire depth of wound visualized     Wound extent: areolar tissue violated     Wound extent: no underlying fracture noted   Treatment:    Area cleansed with:  Shur-Clens   Amount of cleaning:  Standard Skin repair:    Repair method:  Staples   Number of staples:  3 Approximation:    Approximation:  Close Repair type:    Repair type:  Simple Post-procedure details:    Dressing:  Non-adherent dressing   Procedure completion:  Tolerated well, no immediate complications Comments:     Staples placed by PA student under my direct supervision.   Medications Ordered in ED Medications  lidocaine-EPINEPHrine (XYLOCAINE W/EPI) 2 %-1:200000 (PF) injection (has no administration in time range)    ED Course  I have reviewed the triage vital signs and the nursing notes.  Pertinent labs & imaging results that were available during my care of the patient were reviewed by me and considered in my medical decision making (see chart for details).    MDM Rules/Calculators/A&P                           Patient with fall from bed, history of many similar episodes previously.  Has hematoma and laceration to the back of the head and also complains of bilateral knee pain.  Will get CTs of the head, maxillofacial bones and CT of the cervical spine.   Patient does have some ecchymosis surrounding the eye but per facility this is old from a previous fall and patient denies pain in this area.  He also complains of bilateral knee pain.  No hip pain or tenderness on exam.  No other areas of injury or pain.  Ordered and reviewed imaging independently, agree with radiologist findings. No evidence of acute intracranial injury or skull fracture, small left parietal scalp hematoma noted and patient has small laceration in this area with bleeding controlled No acute fracture or malalignment noted in the C-spine and no acute fracture or other abnormality noted over the maxillofacial bones.  Scalp laceration closed with 3 staples and dressing applied.  At this time patient is stable for discharge back to skilled nursing facility. [facility] transport back to facility arranged by nursing staff.  Notified by nursing staff that while patient is waiting for transfer back to skilled nursing facility he is becoming increasingly agitated, trying to get out of bed, is a high fall risk, is typically on Risperdal multiple times daily and has not had this, will give 5 mg IM Haldol for agitation.  Final Clinical Impression(s) / ED Diagnoses Final diagnoses:  Fall, initial encounter  Laceration of scalp, initial encounter  Acute pain of both knees    Rx / DC Orders ED Discharge Orders  None        Legrand Rams 07/19/21 2356    Gwyneth Sprout, MD 07/20/21 2330

## 2021-07-19 NOTE — ED Notes (Addendum)
Patient repeatedly attempting to get out of bed, staff constantly redirecting patient to remain laying down. Pt readjusted, provided blankets to improve comfort, and educated on plan of care/risk of his actions. No reduction in patient agitation has occurred.

## 2021-07-20 NOTE — ED Notes (Signed)
I attempted to call [facility]  To give report  There was an initial answer and was supposed to be transferred   bgut no one ever came on the line after approx 3 minutes I hung up

## 2021-07-20 NOTE — ED Notes (Signed)
Pt waiting for [facility] still

## 2021-07-20 NOTE — ED Notes (Signed)
[facility] here to transport 

## 2021-07-27 ENCOUNTER — Emergency Department (HOSPITAL_COMMUNITY): Payer: Medicare Other

## 2021-07-27 ENCOUNTER — Emergency Department (HOSPITAL_COMMUNITY)
Admission: EM | Admit: 2021-07-27 | Discharge: 2021-07-28 | Disposition: A | Discharge status: Home / Self Care | Payer: Medicare Other | Attending: Emergency Medicine | Admitting: Emergency Medicine

## 2021-07-27 DIAGNOSIS — Y92003 Bedroom of unspecified non-institutional (private) residence as the place of occurrence of the external cause: Secondary | ICD-10-CM | POA: Insufficient documentation

## 2021-07-27 DIAGNOSIS — Z7982 Long term (current) use of aspirin: Secondary | ICD-10-CM | POA: Diagnosis not present

## 2021-07-27 DIAGNOSIS — S3991XA Unspecified injury of abdomen, initial encounter: Secondary | ICD-10-CM | POA: Diagnosis not present

## 2021-07-27 DIAGNOSIS — W19XXXA Unspecified fall, initial encounter: Secondary | ICD-10-CM

## 2021-07-27 DIAGNOSIS — Y9301 Activity, walking, marching and hiking: Secondary | ICD-10-CM | POA: Diagnosis not present

## 2021-07-27 DIAGNOSIS — W1830XA Fall on same level, unspecified, initial encounter: Secondary | ICD-10-CM | POA: Insufficient documentation

## 2021-07-27 DIAGNOSIS — S01511A Laceration without foreign body of lip, initial encounter: Secondary | ICD-10-CM | POA: Diagnosis not present

## 2021-07-27 DIAGNOSIS — F0390 Unspecified dementia without behavioral disturbance: Secondary | ICD-10-CM | POA: Diagnosis not present

## 2021-07-27 DIAGNOSIS — Z23 Encounter for immunization: Secondary | ICD-10-CM | POA: Insufficient documentation

## 2021-07-27 DIAGNOSIS — S0993XA Unspecified injury of face, initial encounter: Secondary | ICD-10-CM | POA: Diagnosis present

## 2021-07-27 DIAGNOSIS — Z79899 Other long term (current) drug therapy: Secondary | ICD-10-CM | POA: Insufficient documentation

## 2021-07-27 DIAGNOSIS — E039 Hypothyroidism, unspecified: Secondary | ICD-10-CM | POA: Insufficient documentation

## 2021-07-27 DIAGNOSIS — Z91040 Latex allergy status: Secondary | ICD-10-CM | POA: Insufficient documentation

## 2021-07-27 LAB — CBC WITH DIFFERENTIAL/PLATELET
Abs Immature Granulocytes: 0.03 10*3/uL (ref 0.00–0.07)
Basophils Absolute: 0 10*3/uL (ref 0.0–0.1)
Basophils Relative: 1 %
Eosinophils Absolute: 0.1 10*3/uL (ref 0.0–0.5)
Eosinophils Relative: 2 %
HCT: 37.2 % — ABNORMAL LOW (ref 39.0–52.0)
Hemoglobin: 12.3 g/dL — ABNORMAL LOW (ref 13.0–17.0)
Immature Granulocytes: 1 %
Lymphocytes Relative: 35 %
Lymphs Abs: 2.1 10*3/uL (ref 0.7–4.0)
MCH: 29.3 pg (ref 26.0–34.0)
MCHC: 33.1 g/dL (ref 30.0–36.0)
MCV: 88.6 fL (ref 80.0–100.0)
Monocytes Absolute: 0.7 10*3/uL (ref 0.1–1.0)
Monocytes Relative: 11 %
Neutro Abs: 3.1 10*3/uL (ref 1.7–7.7)
Neutrophils Relative %: 50 %
Platelets: 142 10*3/uL — ABNORMAL LOW (ref 150–400)
RBC: 4.2 MIL/uL — ABNORMAL LOW (ref 4.22–5.81)
RDW: 14.5 % (ref 11.5–15.5)
WBC: 6 10*3/uL (ref 4.0–10.5)
nRBC: 0 % (ref 0.0–0.2)

## 2021-07-27 LAB — COMPREHENSIVE METABOLIC PANEL
ALT: 9 U/L (ref 0–44)
AST: 17 U/L (ref 15–41)
Albumin: 3.1 g/dL — ABNORMAL LOW (ref 3.5–5.0)
Alkaline Phosphatase: 60 U/L (ref 38–126)
Anion gap: 6 (ref 5–15)
BUN: 22 mg/dL — ABNORMAL HIGH (ref 6–20)
CO2: 25 mmol/L (ref 22–32)
Calcium: 8.6 mg/dL — ABNORMAL LOW (ref 8.9–10.3)
Chloride: 107 mmol/L (ref 98–111)
Creatinine, Ser: 1.08 mg/dL (ref 0.61–1.24)
GFR, Estimated: >60 mL/min (ref >60)
Glucose, Bld: 73 mg/dL (ref 70–99)
Potassium: 4.4 mmol/L (ref 3.5–5.1)
Sodium: 138 mmol/L (ref 135–145)
Total Bilirubin: 0.5 mg/dL (ref 0.3–1.2)
Total Protein: 6.3 g/dL — ABNORMAL LOW (ref 6.5–8.1)

## 2021-07-27 MED ORDER — TETANUS-DIPHTH-ACELL PERTUSSIS 5-2.5-18.5 LF-MCG/0.5 IM SUSY
0.5000 mL | PREFILLED_SYRINGE | Freq: Once | INTRAMUSCULAR | Status: AC
  Administered 2021-07-28: 0.5 mL via INTRAMUSCULAR
  Filled 2021-07-27: qty 0.5

## 2021-07-27 MED ORDER — LIDOCAINE HCL (PF) 1 % IJ SOLN
10.0000 mL | Freq: Once | INTRAMUSCULAR | Status: AC
  Administered 2021-07-27: 10 mL via INTRADERMAL
  Filled 2021-07-27: qty 10

## 2021-07-27 MED ORDER — SODIUM CHLORIDE 0.9 % IV BOLUS
500.0000 mL | Freq: Once | INTRAVENOUS | Status: AC
  Administered 2021-07-27: 500 mL via INTRAVENOUS

## 2021-07-27 NOTE — ED Provider Notes (Signed)
[facility] EMERGENCY DEPARTMENT Provider Note   CSN: [id number] Arrival date & time: 07/27/21  2136     History Chief Complaint  Patient presents with   [name]    [name] is a 55 y.o. male past medical history of TBI, Addison disease, dementia, encephalopathy, hyperlipidemia brought in by EMS from [facility] who presents for evaluation of fall.  Patient does not provide much history.  I talked to the nurse Arley Phenix) at [facility].  She states the patient has constant falls.  He is supposed to wear a helmet.  She states that today, he was try to get out of his bed and walk around to the room and fell and face planted.  She does not know if he had LOC.  He is on aspirin.  She does not think he is on any other blood thinners.  She states normally, he is not able to answer questions but she states that she did notice some swelling around his face.  He also reports some deformity noted to left pinky, right shoulder.  EM LEVEL 5 CAVEAT DUE TO TBI/DEMENTIA   The history is provided by the patient.      Past Medical History:  Diagnosis Date   Addison disease (HCC)    Anoxic brain injury (HCC)    TBI   Anxiety    Aspiration pneumonia (HCC)    Atrial fibrillation (HCC)    Chronic constipation 11/23/2011   Dementia (HCC)    Dysphagia    Encephalopathy    Frontal lobe syndrome    Gait abnormality    freq falls   Glaucoma    Glucocorticoid deficiency (HCC)    History of heart attack    History of recurrent UTIs    Hyperlipemia    Hypothyroidism    Mental disorder    Myocardial infarction (HCC)    Pneumoperitoneum of unknown etiology 12/21/2011   Quadriplegia (HCC)    Reflux    Seizure disorder (HCC)    Weakness of both legs     Patient Active Problem List   Diagnosis Date Noted   PEG Gastrostomy tube in place (HCC) 01/26/2018   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 01/26/2018 01/26/2018   Bedridden 01/26/2018   Quadriplegia (HCC)     Abdominal wall abscess 01/25/2018   Fracture of humerus anatomical neck, right, closed, initial encounter 10/03/2017   AKI (acute kidney injury) (HCC) 10/03/2017   UTI (urinary tract infection) 10/03/2017   Agitation 07/03/2014   Cardiomyopathy, ischemic 06/18/2014   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   Acute psychosis (HCC) 04/19/2014   Unspecified hypothyroidism 05/16/2013   Constipation 05/16/2013   Anemia of other chronic disease 05/16/2013   Mental disorder 01/03/2013   Addisons disease (HCC) 01/03/2013   PEG (percutaneous endoscopic gastrostomy) adjustment/replacement/removal (HCC) 12/21/2011   Hypokalemia 11/25/2011   Thrombocytopenia (HCC) 11/25/2011   FTT (failure to thrive) in adult 11/25/2011   Grand mal seizure (HCC) 11/23/2011   Depression (emotion) 11/23/2011   History of atrial fibrillation 11/23/2011   Other specified hemorrhagic conditions (HCC) 11/23/2011   Glaucoma 11/23/2011   Hypoglycemia 11/23/2011   Chronic constipation 11/23/2011   Anoxic brain injury (HCC) 11/12/2011   Protein calorie malnutrition (HCC) 11/12/2011   Hyperchloremia 11/12/2011   Hyperthyroidism 11/12/2011   Acute lower UTI 11/12/2011   Dysphagia     Past Surgical History:  Procedure Laterality Date   DEBRIDEMENT OF ABDOMINAL WALL ABSCESS  01/26/2018   HERNIA MESH REMOVAL  01/26/2018  Infected mesh - removed w I&D abd wall abscess   IR GENERIC HISTORICAL  10/20/2016   IR REPLC GASTRO/COLONIC TUBE PERCUT W/FLUORO 10/20/2016 Oley Balm, MD [facility]-INTERV RAD   IR REPLACE G-TUBE SIMPLE WO FLUORO  01/28/2018   IR REPLC GASTRO/COLONIC TUBE PERCUT W/FLUORO  04/21/2017   IR REPLC GASTRO/COLONIC TUBE PERCUT W/FLUORO  07/22/2017   IR REPLC GASTRO/COLONIC TUBE PERCUT W/FLUORO  11/24/2017   IR REPLC GASTRO/COLONIC TUBE PERCUT W/FLUORO  08/12/2020   IRRIGATION AND DEBRIDEMENT ABSCESS N/A 01/26/2018   Procedure: IRRIGATION AND DEBRIDEMENT ABDOMINAL WALL ABSCESS WITH REMOVAL OF INFECTED MESH;  Surgeon:  Karie Soda, MD;  Location: [facility] ORS;  Service: General;  Laterality: N/A;   NEPHRECTOMY  unknown   PEG TUBE PLACEMENT         No family history on file.  Social History   Tobacco Use   Smoking status: Never   Smokeless tobacco: Never  Vaping Use   Vaping Use: Never used  Substance Use Topics   Alcohol use: No   Drug use: No    Home Medications Prior to Admission medications   Medication Sig Start Date End Date Taking? Authorizing Provider  acetaminophen (TYLENOL) 325 MG tablet Place 650 mg into feeding tube every 4 (four) hours as needed for mild pain.    [provider]  Amino Acids-Protein Hydrolys (FEEDING SUPPLEMENT, PRO-STAT SUGAR FREE 64,) LIQD Place 30 mLs into feeding tube 2 (two) times daily.     [provider]  aspirin 81 MG chewable tablet Chew 81 mg by mouth daily.    [provider]  Carboxymeth-Glycerin-Polysorb (REFRESH OPTIVE ADVANCED OP) Place 1 drop into both eyes 3 (three) times daily.    [provider]  clonazePAM (KLONOPIN) 0.5 MG tablet Take 0.5 mg by mouth at bedtime.    [provider]  diclofenac Sodium (VOLTAREN) 1 % GEL Apply 4 g topically 4 (four) times daily as needed. 05/29/20   Hilts, Casimiro Needle, MD  divalproex (DEPAKOTE SPRINKLE) 125 MG capsule Take 500 mg by mouth 2 (two) times daily.  07/18/13   [provider]  levothyroxine (SYNTHROID, LEVOTHROID) 150 MCG tablet Place 150 mcg into feeding tube daily before breakfast.     [provider]  metoprolol tartrate (LOPRESSOR) 25 MG tablet Take 12.5 mg by mouth 2 (two) times daily.    [provider]  Multiple Vitamins-Minerals (CERTAVITE/ANTIOXIDANTS) LIQD 15 mLs by PEG Tube route daily at 12 noon.     [provider]  pantoprazole sodium (PROTONIX) 40 mg/20 mL PACK 40 mg by PEG Tube route daily at 12 noon.  11/30/11   [name], MD  PARoxetine (PAXIL) 10 MG tablet Take 10 mg by mouth daily.    [provider]   polyethylene glycol (MIRALAX / GLYCOLAX) packet 17 g by PEG Tube route 2 (two) times daily. Mix 17gm  In 4-8 ounces of liquid    [provider]  polyvinyl alcohol (LIQUIFILM TEARS) 1.4 % ophthalmic solution Place 1 drop into both eyes 2 (two) times daily.    [provider]  risperiDONE (RISPERDAL) 1 MG tablet Take 1 mg by mouth in the morning, at noon, in the evening, and at bedtime. 9 AM, 1 PM, 5 PM, 9 PM    [provider]  sennosides (SENOKOT) 8.8 MG/5ML syrup Place 15 mLs into feeding tube 2 (two) times daily.     [provider]  traZODone (DESYREL) 150 MG tablet Take 75 mg by mouth at  bedtime.  08/02/13   [provider]    Allergies    Ativan [lorazepam], Latex, Morphine and related, Penicillins, Pyridium [phenazopyridine hcl], and Soy allergy  Review of Systems   Review of Systems  Unable to perform ROS: Mental status change   Physical Exam Updated Vital Signs BP 99/70   Pulse 74   Temp 98.9 F (37.2 C)   Resp 18   SpO2 100%   Physical Exam Vitals and nursing note reviewed.  Constitutional:      Appearance: Normal appearance. He is well-developed.  HENT:     Head: Normocephalic.     Comments: No tenderness to palpation of skull. No deformities or crepitus noted.  Scattered abrasions noted to forehead.  Diffuse ecchymosis to nasal bridge with some swelling.  No septal hematoma.    Mouth/Throat:      Comments: 1 cm laceration noted just proximal to the upper lip that extends through to the vermillion border.  Normal proximal.  The laceration was more than evulsion/abrasion.  He does have an anterior lip laceration as well.  This area was probed with a sterile Q-tip and did not see that it extends only through. Eyes:     General: Lids are normal.     Conjunctiva/sclera: Conjunctivae normal.     Pupils: Pupils are equal, round, and reactive to light.     Comments: PERRL. EOMs intact. No nystagmus. No neglect.   Neck:      Comments: No midline C spine tenderness.  Cardiovascular:     Rate and Rhythm: Normal rate and regular rhythm.     Pulses: Normal pulses.     Heart sounds: Normal heart sounds. No murmur heard.   No friction rub. No gallop.  Pulmonary:     Effort: Pulmonary effort is normal.     Breath sounds: Normal breath sounds.     Comments: Lungs clear to auscultation bilaterally.  Symmetric chest rise.  No wheezing, rales, rhonchi. Chest:     Comments: No anterior chest wall tenderness.  No deformity or crepitus noted.  No evidence of flail chest. Abdominal:     Palpations: Abdomen is soft. Abdomen is not rigid.     Tenderness: There is no abdominal tenderness. There is no guarding.     Comments: Abdomen is soft, non-distended, non-tender. No rigidity, No guarding. No peritoneal signs.  Musculoskeletal:        General: Normal range of motion.     Comments: Right shoulder.  There is some deformity but he does not seem to have pain with passive range of motion.  Negative.  Left fifth digit is slightly flexed.  No deformity.  No pelvic instability noted.  Skin:    General: Skin is warm and dry.     Capillary Refill: Capillary refill takes less than 2 seconds.  Neurological:     Mental Status: He is alert.     Comments: Limited exam secondary to TBI.  MAE spontaneously Follows commands.  5/5 BUE and BLE.  Difficulty assessing cranial nerves due to facial pain.   Psychiatric:        Speech: Speech normal.    ED Results / Procedures / Treatments   Labs (all labs ordered are listed, but only abnormal results are displayed) Labs Reviewed  COMPREHENSIVE METABOLIC PANEL - Abnormal; Notable for the following components:      Result Value   BUN 22 (*)    Calcium 8.6 (*)    Total Protein 6.3 (*)  Albumin 3.1 (*)    All other components within normal limits  CBC WITH DIFFERENTIAL/PLATELET - Abnormal; Notable for the following components:   RBC 4.20 (*)    Hemoglobin 12.3 (*)    HCT 37.2 (*)     Platelets 142 (*)    All other components within normal limits    EKG None  Radiology DG Chest 2 View  Result Date: 07/27/2021 CLINICAL DATA:  Fall.  Trauma. EXAM: CHEST - 2 VIEW COMPARISON:  Radiograph 02/29/2020 FINDINGS: Again seen elevation of right hemidiaphragm. Improved basilar atelectasis. Normal heart size and mediastinal contours. No pneumothorax or pleural effusion. No focal airspace disease. No acute osseous abnormalities are seen. Chronic deformity of the right proximal humerus is assessed on concurrent shoulder exam. IMPRESSION: No acute findings. Elevated right hemidiaphragm, unchanged from prior. Electronically Signed   By: Narda Rutherford M.D.   On: 07/27/2021 23:16   DG Pelvis 1-2 Views  Result Date: 07/27/2021 CLINICAL DATA:  Fall, trauma. EXAM: PELVIS - 1-2 VIEW COMPARISON:  None. FINDINGS: Trochanteric femurs are not entirely included in the field of view, additionally the inferior aspect of the pubic rami are obscured. The cortical margins of the included pelvis are intact. No fracture. Pubic symphysis and sacroiliac joints are congruent. Both femoral heads are well-seated in the respective acetabula. IMPRESSION: No fracture of the included pelvis. Inferior aspect of the pubic rami is well as trochanteric femurs are not entirely included in the field of view. Electronically Signed   By: Narda Rutherford M.D.   On: 07/27/2021 23:14   DG Shoulder Right  Result Date: 07/27/2021 CLINICAL DATA:  Fall, trauma. EXAM: RIGHT SHOULDER - 2+ VIEW COMPARISON:  Humerus radiograph 01/08/2021 FINDINGS: Remote healed fracture deformity of the proximal humerus, unchanged in alignment. No acute fracture. Normal acromioclavicular joint. Glenohumeral alignment is stable from prior exam. IMPRESSION: Remote healed fracture deformity of the proximal humerus. No acute fracture. Electronically Signed   By: Narda Rutherford M.D.   On: 07/27/2021 23:12   CT Head Wo Contrast  Result Date:  07/27/2021 CLINICAL DATA:  Head trauma, mod-severe EXAM: CT HEAD WITHOUT CONTRAST TECHNIQUE: Contiguous axial images were obtained from the base of the skull through the vertex without intravenous contrast. COMPARISON:  Head, face, and cervical spine CT 8 days ago. FINDINGS: Brain: Patient had difficulty tolerating the exam, there is motion artifact. Generalized atrophy for age, stable. No intracranial hemorrhage, mass effect, or midline shift. No hydrocephalus. The basilar cisterns are patent. No evidence of territorial infarct or acute ischemia. No extra-axial or intracranial fluid collection. Vascular: No obvious hyperdense vessel, motion obscured MCAs. Skull base atherosclerosis is seen. Skull: No fracture or focal lesion. Sinuses/Orbits: Assessed on concurrent face CT, reported separately. Other: Posterior occipital scalp hematoma with skin staples in place. Small right frontal scalp hematoma. IMPRESSION: 1. Motion limited exam. No acute intracranial abnormality. No skull fracture. 2. Posterior occipital scalp hematoma with skin staples in place. Small right frontal scalp hematoma. Electronically Signed   By: Narda Rutherford M.D.   On: 07/27/2021 23:01   CT Cervical Spine Wo Contrast  Result Date: 07/27/2021 CLINICAL DATA:  Trauma. EXAM: CT CERVICAL SPINE WITHOUT CONTRAST TECHNIQUE: Multidetector CT imaging of the cervical spine was performed without intravenous contrast. Multiplanar CT image reconstructions were also generated. COMPARISON:  Head, face, and cervical spine CT 8 days ago. FINDINGS: Alignment: Stable straightening and mild broad-based reversal of normal lordosis. Unchanged trace anterolisthesis of C2 on C3. No traumatic subluxation. Skull base and vertebrae:  No acute fracture. Vertebral body heights are maintained. The dens and skull base are intact. Soft tissues and spinal canal: No prevertebral fluid or swelling. No visible canal hematoma. Disc levels: Diffuse degenerative disc disease with  multilevel facet hypertrophy, unchanged. Upper chest: No acute or unexpected findings. Other: None. IMPRESSION: 1. No acute fracture or subluxation of the cervical spine. 2. Stable multilevel degenerative disc disease and facet hypertrophy. Electronically Signed   By: Narda RutherfordMelanie  Sanford M.D.   On: 07/27/2021 23:05   DG Hand Complete Left  Result Date: 07/27/2021 CLINICAL DATA:  Trauma.  Scratches to left hand EXAM: LEFT HAND - COMPLETE 3+ VIEW COMPARISON:  None. FINDINGS: No acute fracture or dislocation. Fifth digit is held in flexion at the distal interphalangeal joint and extension at the proximal interphalangeal joint on all views. Otherwise normal alignment. No focal soft tissue abnormality. IMPRESSION: 1. No acute fracture or dislocation. 2. Flexion of the fifth digit at the distal interphalangeal joint and extension at the proximal interphalangeal joint, of uncertain acuity, but favored to be chronic. Electronically Signed   By: Narda RutherfordMelanie  Sanford M.D.   On: 07/27/2021 23:15   CT Maxillofacial Wo Contrast  Result Date: 07/27/2021 CLINICAL DATA:  Trauma. EXAM: CT MAXILLOFACIAL WITHOUT CONTRAST TECHNIQUE: Multidetector CT imaging of the maxillofacial structures was performed. Multiplanar CT image reconstructions were also generated. COMPARISON:  Head, face, and cervical spine CT 8 days ago. FINDINGS: Osseous: Chronic nasal bone deformity unchanged in appearance. No acute nasal bone fracture. No acute fracture of the zygomatic arches or mandibles. Symmetric anterior positioning of the mandibular condyles from the temporomandibular fossa, also seen on prior. Orbits: No acute orbital fracture or globe injury. Stable appearance of prior left medial orbital wall fracture. Sinuses: No sinus fracture or fluid level. Trace mucosal thickening of the right maxillary sinus. Occasional opacification of left mastoid air cells, stable. Soft tissues: Nonacute. Limited intracranial: Assessed on concurrent head CT, reported  separately. IMPRESSION: No acute facial bone fracture. Chronic nasal bone and left medial orbital wall fractures. Electronically Signed   By: Narda RutherfordMelanie  Sanford M.D.   On: 07/27/2021 23:10    Procedures .Marland Kitchen.Laceration Repair  Date/Time: 07/27/2021 11:52 PM Performed by: Maxwell CaulLayden, Xaiver Roskelley A, PA-C Authorized by: Maxwell CaulLayden, Norma Ignasiak A, PA-C   Consent:    Consent obtained:  Verbal   Consent given by:  Patient   Risks discussed:  Infection, need for additional repair, pain, poor cosmetic result and poor wound healing   Alternatives discussed:  No treatment and delayed treatment Universal protocol:    Procedure explained and questions answered to patient or proxy's satisfaction: yes     Relevant documents present and verified: yes     Test results available: yes     Imaging studies available: yes     Required blood products, implants, devices, and special equipment available: yes     Site/side marked: yes     Immediately prior to procedure, a time out was called: yes     Patient identity confirmed:  Verbally with patient Anesthesia:    Anesthesia method:  Local infiltration   Local anesthetic:  Lidocaine 1% WITH epi Laceration details:    Location:  Lip   Lip location:  Upper exterior lip and upper interior lip   Length (cm):  1 Pre-procedure details:    Preparation:  Patient was prepped and draped in usual sterile fashion Exploration:    Hemostasis achieved with:  Direct pressure Treatment:    Area cleansed with:  Povidone-iodine   Amount  of cleaning:  Extensive   Irrigation solution:  Sterile saline   Irrigation method:  Syringe   Visualized foreign bodies/material removed: no     Layers/structures repaired:  Deep subcutaneous Deep subcutaneous:    Suture size:  5-0   Suture material:  Vicryl   Suture technique:  Simple interrupted   Number of sutures:  4 Skin repair:    Repair method:  Sutures   Suture size:  6-0   Suture material:  Prolene   Suture technique:  Simple  interrupted   Number of sutures:  2 Repair type:    Repair type:  Simple Post-procedure details:    Procedure completion:  Tolerated Comments:     Once the wound was anesthetized, irrigated.  Evaluation showed he had both a laceration/abrasion noted to the upper lip that abutted just at the vermilion border.  After cleaning it, I did appear to slightly cross over just barely.  Small area, he had an avulsion/abrasion that could not be brought together.  The more distal end was a 1 cm laceration that was fixed.  He did have an anterior laceration as well.  I did try and probed with a sterile Q-tip and did not see it extends through and through but it was relatively at the same spot.  Anterior lip was repaired with Vicryl.   Medications Ordered in ED Medications  Tdap (BOOSTRIX) injection 0.5 mL (has no administration in time range)  fentaNYL (SUBLIMAZE) injection 50 mcg (has no administration in time range)  ondansetron (ZOFRAN) injection 4 mg (has no administration in time range)  sodium chloride 0.9 % bolus 500 mL (500 mLs Intravenous New Bag/Given 07/27/21 2318)  lidocaine (PF) (XYLOCAINE) 1 % injection 10 mL (10 mLs Intradermal Given 07/27/21 2315)    ED Course  I have reviewed the triage vital signs and the nursing notes.  Pertinent labs & imaging results that were available during my care of the patient were reviewed by me and considered in my medical decision making (see chart for details).    MDM Rules/Calculators/A&P                           54 year old male past ministry of TBI who presents via EMS from [facility] for evaluation of fall.  Patient has frequent falls.  He is post to wear a helmet.  He was not wearing helmet today.  He tried to get out of bed, fell down and landed face first.  I discussed with [facility] Mallow nurse) who states that patient with history of TBI and states that he normally is confused and does not answer most questions.  She states he has a history  of falls and states that he frequently wears a helmet.  Today when he fell, he did not have a helmet on.  She thinks he is on aspirin but no other blood thinners.  CT head negative for any acute abnormality.  There is posterior occipital scalp hematoma.  Small right frontal scalp hematoma.  CT C-spine shows no acute fracture or dislocation.  CT maxillofacial showed no acute facial bone fracture.  He has a chronic nasal bone and left medial orbital wall fracture.  X-ray shows remote healed fracture deformity proximal humerus.  No acute fracture.  X-ray of hand shows no acute fracture or dislocation.  There is flexion of the fifth digit at the distal interphalangeal joint extension of the proximal interphalangeal joint of uncertain acuity.  Favor to be chronic. CXR negative.   CBC shows no leukocytosis.  Hemoglobin stable at 12.3.  CMP shows BUN 22, creatinine 1.08.  Patient seems to be at mental baseline.  Imaging reassuring here today.  RN informed that patient was complaining of abdominal pain.  On my evaluation, he points to the mid abdomen as the region of pain.  He does have an extensive abdominal history and has surgical incision scars noted.  He does have a bandage.  I removed his bandage there appeared to be a small erythematous patch.  No fluctuance that would be concerning for abscess.  Given history, will obtain imaging to ensure no traumatic injury.  Patient signed out to Dr. Peri Jefferson.   Portions of this note were generated with Scientist, clinical (histocompatibility and immunogenetics). Dictation errors may occur despite best attempts at proofreading.   Final Clinical Impression(s) / ED Diagnoses Final diagnoses:  Fall, initial encounter  Lip laceration, initial encounter    Rx / DC Orders ED Discharge Orders     None        [name], PA-C 07/28/21 0049    Melene Plan, DO 07/28/21 1601

## 2021-07-28 ENCOUNTER — Emergency Department (HOSPITAL_COMMUNITY): Payer: Medicare Other

## 2021-07-28 DIAGNOSIS — S01511A Laceration without foreign body of lip, initial encounter: Secondary | ICD-10-CM | POA: Diagnosis not present

## 2021-07-28 MED ORDER — ONDANSETRON HCL 4 MG/2ML IJ SOLN
4.0000 mg | Freq: Once | INTRAMUSCULAR | Status: AC
  Administered 2021-07-28: 4 mg via INTRAVENOUS
  Filled 2021-07-28: qty 2

## 2021-07-28 MED ORDER — DIAZEPAM 5 MG/ML IJ SOLN
2.5000 mg | Freq: Once | INTRAMUSCULAR | Status: AC
  Administered 2021-07-28: 2.5 mg via INTRAMUSCULAR
  Filled 2021-07-28: qty 2

## 2021-07-28 MED ORDER — FENTANYL CITRATE (PF) 100 MCG/2ML IJ SOLN
50.0000 ug | Freq: Once | INTRAMUSCULAR | Status: AC
  Administered 2021-07-28: 50 ug via INTRAVENOUS
  Filled 2021-07-28: qty 2

## 2021-07-28 MED ORDER — DIAZEPAM 5 MG/ML IJ SOLN
2.5000 mg | Freq: Once | INTRAMUSCULAR | Status: AC
  Administered 2021-07-28: 2.5 mg via INTRAVENOUS
  Filled 2021-07-28: qty 2

## 2021-07-28 MED ORDER — DIPHENHYDRAMINE HCL 50 MG/ML IJ SOLN
25.0000 mg | Freq: Once | INTRAMUSCULAR | Status: AC
  Administered 2021-07-28: 25 mg via INTRAVENOUS
  Filled 2021-07-28: qty 1

## 2021-07-28 NOTE — ED Notes (Signed)
MD applied sling to right shoulder

## 2021-07-28 NOTE — Progress Notes (Signed)
IV completed per consult. After IV started, pt tried to stand up out of bed; pt was swinging fists stating, "I'll hit you bitch" and attempting to remove IV. Called for help; RN and tech present in room. Recommend considering available options to preserve lines and prevent falls. Notified RN that current IV site was the last available vein appropriate for IV.

## 2021-07-28 NOTE — ED Provider Notes (Signed)
Attestation: Medical screening examination/treatment/procedure(s) were conducted as a shared visit with non-physician practitioner(s) and myself.  I personally evaluated the patient during the encounter.   Briefly, the patient is a 55 y.o. male with h/o TBI, Addison disease, dementia, encephalopathy, hyperlipidemia brought in by EMS from [facility] who presents for evaluation of fall noted to have facial trauma and lip laceration   Vitals:   07/28/21 0655  BP: 112/64  Pulse: 75  Resp: 18  Temp:   SpO2: 98%    CONSTITUTIONAL:  nontoxic-appearing, NAD NEURO:  Alert. Answers limited questions (per baseline) EYES:  pupils equal and reactive ENT/NECK:  Upper lip laceration. Deviated nasal bridge. No epistaxis. No dental trauma. trachea midline, no JVD CARDIO:  reg  rate, reg rhythm, well-perfused PULM:  none labored breathing GI/GU:  Abdomen non-distended. Non-tender. Multiple surgical scars. Gastrostomy insertion site, w/o tube, with bandage over it, clean. MSK/SPINE:  right shoulder with mild deformity. Left small finger hyperextended at PIP. SKIN:  no rash,  PSYCH:  Appropriate speech and behavior   EKG Interpretation  Date/Time:    Ventricular Rate:    PR Interval:    QRS Duration:   QT Interval:    QTC Calculation:   R Axis:     Text Interpretation:          Work up w/o acute injuries to head, face, neck, chest, abd/pelvis, right shoulder or left hand. We did note numerous old injuries.   Laceration closed by APP. Finger and shoulder immobilized.  CT abd also notable for fluid collection in right abd wall. No tenderness to palpation over this area. No associated erythema or skin disruption (other that remote scar). No leukocytosis. Low suspicion for abscess. More likely seroma vs hematoma.  Uncertain of how long the g-tube has been out, but bandaged prior to arrival.  The patient appears reasonably screened and/or stabilized for discharge and I doubt any other medical  condition or other [facility] requiring further screening, evaluation, or treatment in the ED at this time prior to discharge. Safe for discharge with strict return precautions.  Disposition: Discharge to sNF  Condition: Good    ED Discharge Orders     None       Follow Up: Georgann Housekeeper, MD [address] [place] [address] [phone]  Call  as needed        Nira Conn, MD 07/28/21 [phone]

## 2021-07-28 NOTE — ED Notes (Signed)
This RN called report to [facility]. PTAR at bedside to transport pt to facility. Placed finger splint on pt's left pinky per verbal order from EDP. All paperwork given to [facility]

## 2021-07-28 NOTE — ED Notes (Signed)
Pt does not have legal guardian- confirmed with facility. This RN tried to call pt's mother to inform that he is going back to [facility]. Was unable to reach her.

## 2021-07-29 ENCOUNTER — Emergency Department (HOSPITAL_COMMUNITY): Payer: Medicare Other

## 2021-07-29 ENCOUNTER — Emergency Department (HOSPITAL_COMMUNITY)
Admission: EM | Admit: 2021-07-29 | Discharge: 2021-07-29 | Disposition: A | Discharge status: Home / Self Care | Payer: Medicare Other | Attending: Emergency Medicine | Admitting: Emergency Medicine

## 2021-07-29 DIAGNOSIS — S3992XA Unspecified injury of lower back, initial encounter: Secondary | ICD-10-CM | POA: Diagnosis present

## 2021-07-29 DIAGNOSIS — E039 Hypothyroidism, unspecified: Secondary | ICD-10-CM | POA: Diagnosis not present

## 2021-07-29 DIAGNOSIS — Z91040 Latex allergy status: Secondary | ICD-10-CM | POA: Diagnosis not present

## 2021-07-29 DIAGNOSIS — F0390 Unspecified dementia without behavioral disturbance: Secondary | ICD-10-CM | POA: Diagnosis not present

## 2021-07-29 DIAGNOSIS — W1839XA Other fall on same level, initial encounter: Secondary | ICD-10-CM | POA: Insufficient documentation

## 2021-07-29 DIAGNOSIS — S300XXA Contusion of lower back and pelvis, initial encounter: Secondary | ICD-10-CM | POA: Diagnosis not present

## 2021-07-29 DIAGNOSIS — W19XXXD Unspecified fall, subsequent encounter: Secondary | ICD-10-CM

## 2021-07-29 MED ORDER — CLONAZEPAM 0.5 MG PO TABS
0.5000 mg | ORAL_TABLET | Freq: Once | ORAL | Status: AC
  Administered 2021-07-29: 0.5 mg via ORAL
  Filled 2021-07-29: qty 1

## 2021-07-29 NOTE — ED Notes (Signed)
Pt tried to get out of stretcher. Pt stated to be in a recliner. Assisted pt to recliner. Provided water

## 2021-07-29 NOTE — ED Notes (Signed)
Attempted report x1. 

## 2021-07-29 NOTE — ED Triage Notes (Signed)
Pt bib ems from [facility] c/o hematoma on right lower back about 2" round. Ems stated pt denies falling or any injury to area. Pt has hx of dementia and functioning quadriplegic. Pt has right shoulder pain that facility is aware of. Pt left pinky seems to be dislocated per ems.   BP 142/92 Pulse 120 Room Air 98% RR 20

## 2021-07-29 NOTE — ED Provider Notes (Signed)
Emergency Department Provider Note   I have reviewed the triage vital signs and the nursing notes.   HISTORY  Chief Complaint Hematoma    HPI [name] is a 55 y.o. male with PMH reviewed below the emergency department from [facility] with concern for bruising to the lower back.  No known history of recent fall although the patient is seen frequently in the emergency department with falls.  He has chronic pain in the right shoulder and chronic deformity of the left little finger which are known and characterized on prior imaging.  Patient was seen as recently as yesterday where he had a CT scan of the abdomen and pelvis showing chronic findings. Patient unable to provide significant history regarding additional falls or pain.   Level 5 caveat: TBI   Past Medical History:  Diagnosis Date   Addison disease (HCC)    Anoxic brain injury (HCC)    TBI   Anxiety    Aspiration pneumonia (HCC)    Atrial fibrillation (HCC)    Chronic constipation 11/23/2011   Dementia (HCC)    Dysphagia    Encephalopathy    Frontal lobe syndrome    Gait abnormality    freq falls   Glaucoma    Glucocorticoid deficiency (HCC)    History of heart attack    History of recurrent UTIs    Hyperlipemia    Hypothyroidism    Mental disorder    Myocardial infarction (HCC)    Pneumoperitoneum of unknown etiology 12/21/2011   Quadriplegia (HCC)    Reflux    Seizure disorder (HCC)    Weakness of both legs     Patient Active Problem List   Diagnosis Date Noted   PEG Gastrostomy tube in place (HCC) 01/26/2018   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 01/26/2018 01/26/2018   Bedridden 01/26/2018   Quadriplegia (HCC)    Abdominal wall abscess 01/25/2018   Fracture of humerus anatomical neck, right, closed, initial encounter 10/03/2017   AKI (acute kidney injury) (HCC) 10/03/2017   UTI (urinary tract infection) 10/03/2017   Agitation 07/03/2014   Cardiomyopathy, ischemic  06/18/2014   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   Acute psychosis (HCC) 04/19/2014   Unspecified hypothyroidism 05/16/2013   Constipation 05/16/2013   Anemia of other chronic disease 05/16/2013   Mental disorder 01/03/2013   Addisons disease (HCC) 01/03/2013   PEG (percutaneous endoscopic gastrostomy) adjustment/replacement/removal (HCC) 12/21/2011   Hypokalemia 11/25/2011   Thrombocytopenia (HCC) 11/25/2011   FTT (failure to thrive) in adult 11/25/2011   Grand mal seizure (HCC) 11/23/2011   Depression (emotion) 11/23/2011   History of atrial fibrillation 11/23/2011   Other specified hemorrhagic conditions (HCC) 11/23/2011   Glaucoma 11/23/2011   Hypoglycemia 11/23/2011   Chronic constipation 11/23/2011   Anoxic brain injury (HCC) 11/12/2011   Protein calorie malnutrition (HCC) 11/12/2011   Hyperchloremia 11/12/2011   Hyperthyroidism 11/12/2011   Acute lower UTI 11/12/2011   Dysphagia     Past Surgical History:  Procedure Laterality Date   DEBRIDEMENT OF ABDOMINAL WALL ABSCESS  01/26/2018   HERNIA MESH REMOVAL  01/26/2018   Infected mesh - removed w I&D abd wall abscess   IR GENERIC HISTORICAL  10/20/2016   IR REPLC GASTRO/COLONIC TUBE PERCUT W/FLUORO 10/20/2016 Oley Balm, MD [facility]   IR REPLACE G-TUBE SIMPLE WO FLUORO  01/28/2018   IR REPLC GASTRO/COLONIC TUBE PERCUT W/FLUORO  04/21/2017   IR REPLC GASTRO/COLONIC TUBE PERCUT W/FLUORO  07/22/2017   IR REPLC GASTRO/COLONIC TUBE PERCUT  W/FLUORO  11/24/2017   IR REPLC GASTRO/COLONIC TUBE PERCUT W/FLUORO  08/12/2020   IRRIGATION AND DEBRIDEMENT ABSCESS N/A 01/26/2018   Procedure: IRRIGATION AND DEBRIDEMENT ABDOMINAL WALL ABSCESS WITH REMOVAL OF INFECTED MESH;  Surgeon: Karie Soda, MD;  Location: [facility] ORS;  Service: General;  Laterality: N/A;   NEPHRECTOMY  unknown   PEG TUBE PLACEMENT      Allergies Ativan [lorazepam], Latex, Morphine and related, Penicillins, Pyridium [phenazopyridine hcl], and Soy allergy  No family  history on file.  Social History Social History   Tobacco Use   Smoking status: Never   Smokeless tobacco: Never  Vaping Use   Vaping Use: Never used  Substance Use Topics   Alcohol use: No   Drug use: No    Review of Systems  Level 5 caveat: TBI.   ____________________________________________   PHYSICAL EXAM:  VITAL SIGNS: ED Triage Vitals [07/29/21 1619]  Enc Vitals Group     BP 98/73     Pulse Rate 83     Resp 18     Temp 98.5 F (36.9 C)     Temp Source Oral     SpO2 96 %   Constitutional: Alert and following commands.  Eyes: Conjunctivae are normal.  Head: Atraumatic. Nose: No congestion/rhinnorhea. Mouth/Throat: Mucous membranes are moist.  Old appearing lip injury.  Neck: No stridor.   Cardiovascular: Normal rate, regular rhythm. Good peripheral circulation. Grossly normal heart sounds.   Respiratory: Normal respiratory effort.  No retractions. Lungs CTAB. Gastrointestinal: Soft and nontender. No distention.  Musculoskeletal: No lower extremity tenderness nor edema. Deformity of the left little finger (chronic) and chronic pain in the right shoulder.  Neurologic: Alert. Answering limited questions and following commands.  Skin:  Skin is warm, dry and intact. No new appearing lacerations or bruising.   ____________________________________________  RADIOLOGY  DG Chest 2 View  Result Date: 07/29/2021 CLINICAL DATA:  Larey Seat, right lower back hematoma EXAM: CHEST - 2 VIEW COMPARISON:  07/27/2021 FINDINGS: Frontal and lateral views of the chest demonstrate an unremarkable cardiac silhouette. Chronic elevation right hemidiaphragm. No airspace disease, effusion, or pneumothorax. No acute bony abnormalities. IMPRESSION: 1. No acute intrathoracic process. Electronically Signed   By: Sharlet Salina M.D.   On: 07/29/2021 19:07   DG Pelvis 1-2 Views  Result Date: 07/29/2021 CLINICAL DATA:  Larey Seat, right lower back hematoma EXAM: PELVIS - 1-2 VIEW COMPARISON:  07/27/2021  FINDINGS: Supine frontal view of the pelvis includes both hips. No acute displaced fracture, subluxation, or dislocation. Joint spaces are relatively well preserved. Mild degenerative changes at the lumbosacral junction. Sacroiliac joints are normal. IMPRESSION: 1. No acute bony abnormality. Electronically Signed   By: Sharlet Salina M.D.   On: 07/29/2021 19:05    ____________________________________________   PROCEDURES  Procedure(s) performed:   Procedures  None  ____________________________________________   INITIAL IMPRESSION / ASSESSMENT AND PLAN / ED COURSE  Pertinent labs & imaging results that were available during my care of the patient were reviewed by me and considered in my medical decision making (see chart for details).   Presents to the emergency department from [facility] with concern for bruising to the lower back.  No new appearing injuries or bruising appreciated.  No focal tenderness.  History is limited with the patient's TBI so do plan for plain films of the pelvis and chest but do not feel that repeat advanced imaging is required.  Patient had CT abdomen pelvis yesterday with contrast which was reviewed here.  Deformity to  the little finger noted by EMS is chronic.  Patient also with chronic pain to the right shoulder with a known healing proximal humeral fracture.   07:10 PM  Plain films reviewed. No acute findings. Plan for discharge back to [facility] with plan for PCP follow up.  ____________________________________________  FINAL CLINICAL IMPRESSION(S) / ED DIAGNOSES  Final diagnoses:  Fall, subsequent encounter    Note:  This document was prepared using Dragon voice recognition software and may include unintentional dictation errors.  Alona Bene, MD, [facility] Emergency Medicine    Jameison Haji, Arlyss Repress, MD 07/29/21 [phone]

## 2021-07-29 NOTE — Discharge Instructions (Addendum)
Please follow closely with your PCP in the coming week. Return with any new or worsening symptoms.

## 2021-07-29 NOTE — ED Notes (Signed)
Pt tried to get out bed stating he drove and wants to go home. I oriented pt that he is at the hospital and waiting for his ride "PTAR"

## 2021-07-29 NOTE — ED Notes (Signed)
Patient transported to X-ray 

## 2021-07-29 NOTE — ED Notes (Signed)
PTAR called, 7-8 ahead

## 2021-07-30 ENCOUNTER — Emergency Department (HOSPITAL_COMMUNITY): Payer: Medicare Other

## 2021-07-30 ENCOUNTER — Emergency Department (HOSPITAL_COMMUNITY)
Admission: EM | Admit: 2021-07-30 | Discharge: 2021-07-31 | Disposition: A | Discharge status: Skilled Nursing Facility | Payer: Medicare Other | Attending: Emergency Medicine | Admitting: Emergency Medicine

## 2021-07-30 DIAGNOSIS — S01411A Laceration without foreign body of right cheek and temporomandibular area, initial encounter: Secondary | ICD-10-CM | POA: Diagnosis not present

## 2021-07-30 DIAGNOSIS — Z91040 Latex allergy status: Secondary | ICD-10-CM | POA: Diagnosis not present

## 2021-07-30 DIAGNOSIS — S5011XA Contusion of right forearm, initial encounter: Secondary | ICD-10-CM | POA: Insufficient documentation

## 2021-07-30 DIAGNOSIS — Z79899 Other long term (current) drug therapy: Secondary | ICD-10-CM | POA: Diagnosis not present

## 2021-07-30 DIAGNOSIS — S0181XA Laceration without foreign body of other part of head, initial encounter: Secondary | ICD-10-CM

## 2021-07-30 DIAGNOSIS — E039 Hypothyroidism, unspecified: Secondary | ICD-10-CM | POA: Insufficient documentation

## 2021-07-30 DIAGNOSIS — F0390 Unspecified dementia without behavioral disturbance: Secondary | ICD-10-CM | POA: Insufficient documentation

## 2021-07-30 DIAGNOSIS — S0993XA Unspecified injury of face, initial encounter: Secondary | ICD-10-CM | POA: Diagnosis present

## 2021-07-30 DIAGNOSIS — R296 Repeated falls: Secondary | ICD-10-CM | POA: Insufficient documentation

## 2021-07-30 DIAGNOSIS — M47812 Spondylosis without myelopathy or radiculopathy, cervical region: Secondary | ICD-10-CM | POA: Insufficient documentation

## 2021-07-30 DIAGNOSIS — S0990XA Unspecified injury of head, initial encounter: Secondary | ICD-10-CM | POA: Diagnosis not present

## 2021-07-30 DIAGNOSIS — S0083XA Contusion of other part of head, initial encounter: Secondary | ICD-10-CM

## 2021-07-30 DIAGNOSIS — Z7982 Long term (current) use of aspirin: Secondary | ICD-10-CM | POA: Insufficient documentation

## 2021-07-30 DIAGNOSIS — W19XXXA Unspecified fall, initial encounter: Secondary | ICD-10-CM | POA: Insufficient documentation

## 2021-07-30 LAB — BASIC METABOLIC PANEL
Anion gap: 8 (ref 5–15)
BUN: 19 mg/dL (ref 6–20)
CO2: 23 mmol/L (ref 22–32)
Calcium: 8.4 mg/dL — ABNORMAL LOW (ref 8.9–10.3)
Chloride: 103 mmol/L (ref 98–111)
Creatinine, Ser: 1.1 mg/dL (ref 0.61–1.24)
GFR, Estimated: >60 mL/min (ref >60)
Glucose, Bld: 78 mg/dL (ref 70–99)
Potassium: 4.2 mmol/L (ref 3.5–5.1)
Sodium: 134 mmol/L — ABNORMAL LOW (ref 135–145)

## 2021-07-30 LAB — CBC WITH DIFFERENTIAL/PLATELET
Abs Immature Granulocytes: 0.03 10*3/uL (ref 0.00–0.07)
Basophils Absolute: 0 10*3/uL (ref 0.0–0.1)
Basophils Relative: 1 %
Eosinophils Absolute: 0.1 10*3/uL (ref 0.0–0.5)
Eosinophils Relative: 1 %
HCT: 36.5 % — ABNORMAL LOW (ref 39.0–52.0)
Hemoglobin: 11.9 g/dL — ABNORMAL LOW (ref 13.0–17.0)
Immature Granulocytes: 1 %
Lymphocytes Relative: 34 %
Lymphs Abs: 2.1 10*3/uL (ref 0.7–4.0)
MCH: 29.2 pg (ref 26.0–34.0)
MCHC: 32.6 g/dL (ref 30.0–36.0)
MCV: 89.7 fL (ref 80.0–100.0)
Monocytes Absolute: 0.7 10*3/uL (ref 0.1–1.0)
Monocytes Relative: 11 %
Neutro Abs: 3.3 10*3/uL (ref 1.7–7.7)
Neutrophils Relative %: 52 %
Platelets: 146 10*3/uL — ABNORMAL LOW (ref 150–400)
RBC: 4.07 MIL/uL — ABNORMAL LOW (ref 4.22–5.81)
RDW: 14.6 % (ref 11.5–15.5)
WBC: 6.3 10*3/uL (ref 4.0–10.5)
nRBC: 0 % (ref 0.0–0.2)

## 2021-07-30 MED ORDER — LIDOCAINE-EPINEPHRINE (PF) 2 %-1:200000 IJ SOLN
10.0000 mL | Freq: Once | INTRAMUSCULAR | Status: AC
  Administered 2021-07-30: 10 mL
  Filled 2021-07-30: qty 20

## 2021-07-30 MED ORDER — OXYCODONE-ACETAMINOPHEN 5-325 MG PO TABS
1.0000 | ORAL_TABLET | Freq: Once | ORAL | Status: AC
  Administered 2021-07-30: 1 via ORAL
  Filled 2021-07-30: qty 1

## 2021-07-30 NOTE — ED Provider Notes (Signed)
[facility] EMERGENCY DEPARTMENT Provider Note   CSN: [id number] Arrival date & time: 07/30/21  1825     History Chief Complaint  Patient presents with   [name]    [name] is a 55 y.o. male.  HPI He presents by EMS for evaluation of injury from fall.  He is reported to have fallen, witnessed by his nursing care facility.  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  He does not take anticoagulants.  His blood pressure was low on arrival of EMS, but improved spontaneously to 121/89.  He is unable to give additional history  Level 5 caveat-altered mental status    Past Medical History:  Diagnosis Date   Addison disease (HCC)    Anoxic brain injury (HCC)    TBI   Anxiety    Aspiration pneumonia (HCC)    Atrial fibrillation (HCC)    Chronic constipation 11/23/2011   Dementia (HCC)    Dysphagia    Encephalopathy    Frontal lobe syndrome    Gait abnormality    freq falls   Glaucoma    Glucocorticoid deficiency (HCC)    History of heart attack    History of recurrent UTIs    Hyperlipemia    Hypothyroidism    Mental disorder    Myocardial infarction (HCC)    Pneumoperitoneum of unknown etiology 12/21/2011   Quadriplegia (HCC)    Reflux    Seizure disorder (HCC)    Weakness of both legs     Patient Active Problem List   Diagnosis Date Noted   PEG Gastrostomy tube in place (HCC) 01/26/2018   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 01/26/2018 01/26/2018   Bedridden 01/26/2018   Quadriplegia (HCC)    Abdominal wall abscess 01/25/2018   Fracture of humerus anatomical neck, right, closed, initial encounter 10/03/2017   AKI (acute kidney injury) (HCC) 10/03/2017   UTI (urinary tract infection) 10/03/2017   Agitation 07/03/2014   Cardiomyopathy, ischemic 06/18/2014   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   Acute psychosis (HCC) 04/19/2014   Unspecified  hypothyroidism 05/16/2013   Constipation 05/16/2013   Anemia of other chronic disease 05/16/2013   Mental disorder 01/03/2013   Addisons disease (HCC) 01/03/2013   PEG (percutaneous endoscopic gastrostomy) adjustment/replacement/removal (HCC) 12/21/2011   Hypokalemia 11/25/2011   Thrombocytopenia (HCC) 11/25/2011   FTT (failure to thrive) in adult 11/25/2011   Grand mal seizure (HCC) 11/23/2011   Depression (emotion) 11/23/2011   History of atrial fibrillation 11/23/2011   Other specified hemorrhagic conditions (HCC) 11/23/2011   Glaucoma 11/23/2011   Hypoglycemia 11/23/2011   Chronic constipation 11/23/2011   Anoxic brain injury (HCC) 11/12/2011   Protein calorie malnutrition (HCC) 11/12/2011   Hyperchloremia 11/12/2011   Hyperthyroidism 11/12/2011   Acute lower UTI 11/12/2011   Dysphagia     Past Surgical History:  Procedure Laterality Date   DEBRIDEMENT OF ABDOMINAL WALL ABSCESS  01/26/2018   HERNIA MESH REMOVAL  01/26/2018   Infected mesh - removed w I&D abd wall abscess   IR GENERIC HISTORICAL  10/20/2016   IR REPLC GASTRO/COLONIC TUBE PERCUT W/FLUORO 10/20/2016 Oley Balm, MD [facility]   IR REPLACE G-TUBE SIMPLE WO FLUORO  01/28/2018   IR REPLC GASTRO/COLONIC TUBE PERCUT W/FLUORO  04/21/2017   IR REPLC GASTRO/COLONIC TUBE PERCUT W/FLUORO  07/22/2017   IR REPLC GASTRO/COLONIC TUBE PERCUT W/FLUORO  11/24/2017   IR REPLC GASTRO/COLONIC TUBE PERCUT W/FLUORO  08/12/2020   IRRIGATION AND DEBRIDEMENT ABSCESS N/A 01/26/2018   Procedure: IRRIGATION AND DEBRIDEMENT ABDOMINAL WALL ABSCESS WITH REMOVAL OF INFECTED MESH;  Surgeon: Karie Soda, MD;  Location: [facility];  Service: General;  Laterality: N/A;   NEPHRECTOMY  unknown   PEG TUBE PLACEMENT         No family history on file.  Social History   Tobacco Use   Smoking status: Never   Smokeless tobacco: Never  Vaping Use   Vaping Use: Never used  Substance Use Topics   Alcohol use: No   Drug use: No    Home  Medications Prior to Admission medications   Medication Sig Start Date End Date Taking? Authorizing Provider  acetaminophen (TYLENOL) 325 MG tablet Place 650 mg into feeding tube every 4 (four) hours as needed for mild pain.    [provider]  Amino Acids-Protein Hydrolys (FEEDING SUPPLEMENT, PRO-STAT SUGAR FREE 64,) LIQD Place 30 mLs into feeding tube 2 (two) times daily.     [provider]  aspirin 81 MG chewable tablet Chew 81 mg by mouth daily.    [provider]  Carboxymeth-Glycerin-Polysorb (REFRESH OPTIVE ADVANCED OP) Place 1 drop into both eyes 3 (three) times daily.    [provider]  clonazePAM (KLONOPIN) 0.5 MG tablet Take 0.5 mg by mouth at bedtime.    [provider]  diclofenac Sodium (VOLTAREN) 1 % GEL Apply 4 g topically 4 (four) times daily as needed. 05/29/20   Hilts, Casimiro Needle, MD  divalproex (DEPAKOTE SPRINKLE) 125 MG capsule Take 500 mg by mouth 2 (two) times daily.  07/18/13   [provider]  levothyroxine (SYNTHROID, LEVOTHROID) 150 MCG tablet Place 150 mcg into feeding tube daily before breakfast.     [provider]  metoprolol tartrate (LOPRESSOR) 25 MG tablet Take 12.5 mg by mouth 2 (two) times daily.    [provider]  Multiple Vitamins-Minerals (CERTAVITE/ANTIOXIDANTS) LIQD 15 mLs by PEG Tube route daily at 12 noon.     [provider]  pantoprazole sodium (PROTONIX) 40 mg/20 mL PACK 40 mg by PEG Tube route daily at 12 noon.  11/30/11   [name], MD  PARoxetine (PAXIL) 10 MG tablet Take 10 mg by mouth daily.    [provider]  polyethylene glycol (MIRALAX / GLYCOLAX) packet 17 g by PEG Tube route 2 (two) times daily. Mix 17gm  In 4-8 ounces of liquid    [provider]  polyvinyl alcohol (LIQUIFILM TEARS) 1.4 % ophthalmic solution Place 1 drop into both eyes 2 (two) times daily.    [provider]  risperiDONE (RISPERDAL) 1 MG tablet Take 1 mg by mouth in  the morning, at noon, in the evening, and at bedtime. 9 AM, 1 PM, 5 PM, 9 PM    [provider]  sennosides (SENOKOT) 8.8 MG/5ML syrup Place 15 mLs into feeding tube 2 (two) times daily.     [provider]  traZODone (DESYREL) 150 MG tablet Take 75 mg by mouth at bedtime.  08/02/13   [provider]    Allergies    Ativan [lorazepam], Latex, Morphine and related, Penicillins, Pyridium [phenazopyridine hcl], and Soy allergy  Review of Systems   Review of Systems  Unable to perform ROS: Mental status change   Physical Exam Updated Vital Signs BP (!) 138/98   Pulse 95   Temp 98.1 F (36.7 C) (Oral)   Resp 18   SpO2 95%   Physical Exam  Vitals and nursing note reviewed.  Constitutional:      Appearance: He is well-developed. He is not ill-appearing.  HENT:     Head: Normocephalic.     Comments: Contusion abrasion beneath right eye.  He is wearing a helmet, apparently because of frequent falling.  No midface crepitation or deformity.  No trismus.    Right Ear: External ear normal.     Left Ear: External ear normal.  Eyes:     Conjunctiva/sclera: Conjunctivae normal.     Pupils: Pupils are equal, round, and reactive to light.  Neck:     Trachea: Phonation normal.  Cardiovascular:     Rate and Rhythm: Normal rate and regular rhythm.     Heart sounds: Normal heart sounds.  Pulmonary:     Effort: Pulmonary effort is normal.     Breath sounds: Normal breath sounds.  Abdominal:     General: There is no distension.     Palpations: Abdomen is soft.     Tenderness: There is no abdominal tenderness.  Musculoskeletal:        General: Normal range of motion.     Cervical back: Normal range of motion and neck supple.     Comments: Contusion right mid volar forearm, otherwise normal range of motion arms and legs bilaterally.  Normal bilateral strength.  Skin:    General: Skin is warm and dry.  Neurological:     Mental Status: He is alert.     Cranial Nerves:  No cranial nerve deficit.     Motor: No abnormal muscle tone.     Coordination: Coordination normal.  Psychiatric:        Mood and Affect: Mood normal.        Behavior: Behavior normal.    ED Results / Procedures / Treatments   Labs (all labs ordered are listed, but only abnormal results are displayed) Labs Reviewed  BASIC METABOLIC PANEL - Abnormal; Notable for the following components:      Result Value   Sodium 134 (*)    Calcium 8.4 (*)    All other components within normal limits  CBC WITH DIFFERENTIAL/PLATELET - Abnormal; Notable for the following components:   RBC 4.07 (*)    Hemoglobin 11.9 (*)    HCT 36.5 (*)    Platelets 146 (*)    All other components within normal limits    EKG None  Radiology DG Chest 2 View  Result Date: 07/29/2021 CLINICAL DATA:  Larey Seat, right lower back hematoma EXAM: CHEST - 2 VIEW COMPARISON:  07/27/2021 FINDINGS: Frontal and lateral views of the chest demonstrate an unremarkable cardiac silhouette. Chronic elevation right hemidiaphragm. No airspace disease, effusion, or pneumothorax. No acute bony abnormalities. IMPRESSION: 1. No acute intrathoracic process. Electronically Signed   By: Sharlet Salina M.D.   On: 07/29/2021 19:07   DG Pelvis 1-2 Views  Result Date: 07/29/2021 CLINICAL DATA:  Larey Seat, right lower back hematoma EXAM: PELVIS - 1-2 VIEW COMPARISON:  07/27/2021 FINDINGS: Supine frontal view of the pelvis includes both hips. No acute displaced fracture, subluxation, or dislocation. Joint spaces are relatively well preserved. Mild degenerative changes at the lumbosacral junction. Sacroiliac joints are normal. IMPRESSION: 1. No acute bony abnormality. Electronically Signed   By: Sharlet Salina M.D.   On: 07/29/2021 19:05   CT Head Wo Contrast  Result Date: 07/30/2021 CLINICAL DATA:  Polytrauma, critical, head/C-spine injury suspected; Facial trauma. Unwitnessed fall, right facial injury/laceration. EXAM: CT HEAD WITHOUT CONTRAST CT  MAXILLOFACIAL WITHOUT CONTRAST  CT CERVICAL SPINE WITHOUT CONTRAST TECHNIQUE: Multidetector CT imaging of the head, cervical spine, and maxillofacial structures were performed using the standard protocol without intravenous contrast. Multiplanar CT image reconstructions of the cervical spine and maxillofacial structures were also generated. COMPARISON:  None. FINDINGS: CT HEAD FINDINGS Brain: Normal anatomic configuration. Mild parenchymal volume loss is present, slightly advanced in relation of the patient's stated age. No abnormal intra or extra-axial mass lesion or fluid collection. No abnormal mass effect or midline shift. No evidence of acute intracranial hemorrhage or infarct. Ventricular size is normal. Cerebellum unremarkable. Vascular: Unremarkable Skull: Intact Other: Mastoid air cells and middle ear cavities are clear. CT MAXILLOFACIAL FINDINGS Osseous: There is no acute facial fracture identified. A remote fracture of the nasal bones and nasal septum is identified. There is erosion of the central aspect of the nasal septum. Orbits: Negative. No traumatic or inflammatory finding. Sinuses: Clear bilaterally Soft tissues: There is mild soft tissue swelling superficial to the right zygomaticomaxillary complex. CT CERVICAL SPINE FINDINGS Alignment: There is overall straightening of the cervical spine. No listhesis. Skull base and vertebrae: The craniocervical alignment is normal. The atlantodental interval is not widened. There is no acute fracture of the cervical spine. 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 Superimposed posterior disc osteophyte complex ease at C3-C7 results in moderate central canal stenosis with flattening of the thecal sac, most severe at C4-5 with an AP diameter of the spinal canal of approximately 7 mm. No canal hematoma. No prevertebral soft tissue swelling. No paraspinal fluid collections are  identified. Disc levels: There is diffuse intervertebral disc space narrowing and endplate remodeling throughout the cervical spine in keeping with changes of diffuse moderate to severe degenerative disc disease. Vertebral body height has been preserved. The prevertebral soft tissues are not thickened on sagittal reformats. Review of the axial images demonstrates multilevel uncovertebral arthrosis resulting in multilevel mild-to-moderate neuroforaminal narrowing, most severe on the right at C4-5, bilaterally at C5-6, and on the right at C6-7 and C7-T1. Upper chest: Unremarkable Other: None IMPRESSION: No acute intracranial injury.  No calvarial fracture. Mild soft tissue swelling superficial to the right zygomaticomaxillary complex. No acute facial fracture. Remote fracture of the nasal bones and nasal septum. Superimposed erosion of the central nasal septum. No acute fracture or listhesis of the cervical spine. Multilevel advanced degenerative disc and degenerative joint disease which, in combination with congenital narrowing of the spinal canal, results in moderate central canal stenosis at C3-C7 and multilevel neuroforaminal narrowing, most severe at C4-T1 as described above. Electronically Signed   By: Helyn Numbers MD   On: 07/30/2021 22:10   CT Cervical Spine Wo Contrast  Result Date: 07/30/2021 CLINICAL DATA:  Polytrauma, critical, head/C-spine injury suspected; Facial trauma. Unwitnessed fall, right facial injury/laceration. EXAM: CT HEAD WITHOUT CONTRAST CT MAXILLOFACIAL WITHOUT CONTRAST CT CERVICAL SPINE WITHOUT CONTRAST TECHNIQUE: Multidetector CT imaging of the head, cervical spine, and maxillofacial structures were performed using the standard protocol without intravenous contrast. Multiplanar CT image reconstructions of the cervical spine and maxillofacial structures were also generated. COMPARISON:  None. FINDINGS: CT HEAD FINDINGS Brain: Normal anatomic configuration. Mild parenchymal volume loss  is present, slightly advanced in relation of the patient's stated age. No abnormal intra or extra-axial mass lesion or fluid collection. No abnormal mass effect or midline shift. No evidence of acute intracranial hemorrhage or infarct. Ventricular size is normal. Cerebellum unremarkable. Vascular: Unremarkable Skull: Intact Other: Mastoid air  cells and middle ear cavities are clear. CT MAXILLOFACIAL FINDINGS Osseous: There is no acute facial fracture identified. A remote fracture of the nasal bones and nasal septum is identified. There is erosion of the central aspect of the nasal septum. Orbits: Negative. No traumatic or inflammatory finding. Sinuses: Clear bilaterally Soft tissues: There is mild soft tissue swelling superficial to the right zygomaticomaxillary complex. CT CERVICAL SPINE FINDINGS Alignment: There is overall straightening of the cervical spine. No listhesis. Skull base and vertebrae: The craniocervical alignment is normal. The atlantodental interval is not widened. There is no acute fracture of the cervical spine. 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 Superimposed posterior disc osteophyte complex ease at C3-C7 results in moderate central canal stenosis with flattening of the thecal sac, most severe at C4-5 with an AP diameter of the spinal canal of approximately 7 mm. No canal hematoma. No prevertebral soft tissue swelling. No paraspinal fluid collections are identified. Disc levels: There is diffuse intervertebral disc space narrowing and endplate remodeling throughout the cervical spine in keeping with changes of diffuse moderate to severe degenerative disc disease. Vertebral body height has been preserved. The prevertebral soft tissues are not thickened on sagittal reformats. Review of the axial images demonstrates multilevel uncovertebral arthrosis resulting in multilevel mild-to-moderate neuroforaminal  narrowing, most severe on the right at C4-5, bilaterally at C5-6, and on the right at C6-7 and C7-T1. Upper chest: Unremarkable Other: None IMPRESSION: No acute intracranial injury.  No calvarial fracture. Mild soft tissue swelling superficial to the right zygomaticomaxillary complex. No acute facial fracture. Remote fracture of the nasal bones and nasal septum. Superimposed erosion of the central nasal septum. No acute fracture or listhesis of the cervical spine. Multilevel advanced degenerative disc and degenerative joint disease which, in combination with congenital narrowing of the spinal canal, results in moderate central canal stenosis at C3-C7 and multilevel neuroforaminal narrowing, most severe at C4-T1 as described above. Electronically Signed   By: Helyn Numbers MD   On: 07/30/2021 22:10   CT Maxillofacial WO CM  Result Date: 07/30/2021 CLINICAL DATA:  Polytrauma, critical, head/C-spine injury suspected; Facial trauma. Unwitnessed fall, right facial injury/laceration. EXAM: CT HEAD WITHOUT CONTRAST CT MAXILLOFACIAL WITHOUT CONTRAST CT CERVICAL SPINE WITHOUT CONTRAST TECHNIQUE: Multidetector CT imaging of the head, cervical spine, and maxillofacial structures were performed using the standard protocol without intravenous contrast. Multiplanar CT image reconstructions of the cervical spine and maxillofacial structures were also generated. COMPARISON:  None. FINDINGS: CT HEAD FINDINGS Brain: Normal anatomic configuration. Mild parenchymal volume loss is present, slightly advanced in relation of the patient's stated age. No abnormal intra or extra-axial mass lesion or fluid collection. No abnormal mass effect or midline shift. No evidence of acute intracranial hemorrhage or infarct. Ventricular size is normal. Cerebellum unremarkable. Vascular: Unremarkable Skull: Intact Other: Mastoid air cells and middle ear cavities are clear. CT MAXILLOFACIAL FINDINGS Osseous: There is no acute facial fracture  identified. A remote fracture of the nasal bones and nasal septum is identified. There is erosion of the central aspect of the nasal septum. Orbits: Negative. No traumatic or inflammatory finding. Sinuses: Clear bilaterally Soft tissues: There is mild soft tissue swelling superficial to the right zygomaticomaxillary complex. CT CERVICAL SPINE FINDINGS Alignment: There is overall straightening of the cervical spine. No listhesis. Skull base and vertebrae: The craniocervical alignment is normal. The atlantodental interval is not widened. There is no acute fracture of the cervical spine. 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 Superimposed posterior disc osteophyte complex ease at C3-C7 results in moderate central canal stenosis with flattening of the thecal sac, most severe at C4-5 with an AP diameter of the spinal canal of approximately 7 mm. No canal hematoma. No prevertebral soft tissue swelling. No paraspinal fluid collections are identified. Disc levels: There is diffuse intervertebral disc space narrowing and endplate remodeling throughout the cervical spine in keeping with changes of diffuse moderate to severe degenerative disc disease. Vertebral body height has been preserved. The prevertebral soft tissues are not thickened on sagittal reformats. Review of the axial images demonstrates multilevel uncovertebral arthrosis resulting in multilevel mild-to-moderate neuroforaminal narrowing, most severe on the right at C4-5, bilaterally at C5-6, and on the right at C6-7 and C7-T1. Upper chest: Unremarkable Other: None IMPRESSION: No acute intracranial injury.  No calvarial fracture. Mild soft tissue swelling superficial to the right zygomaticomaxillary complex. No acute facial fracture. Remote fracture of the nasal bones and nasal septum. Superimposed erosion of the central nasal septum. No acute fracture or listhesis of the  cervical spine. Multilevel advanced degenerative disc and degenerative joint disease which, in combination with congenital narrowing of the spinal canal, results in moderate central canal stenosis at C3-C7 and multilevel neuroforaminal narrowing, most severe at C4-T1 as described above. Electronically Signed   By: Helyn NumbersAshesh  Parikh MD   On: 07/30/2021 22:10    Procedures .Marland Kitchen.Laceration Repair  Date/Time: 07/30/2021 11:02 PM Performed by: Mancel BaleWentz, Eraina Winnie, MD Authorized by: Mancel BaleWentz, Kirsty Monjaraz, MD   Consent:    Consent given by:  Patient   Risks discussed:  Infection and pain   Alternatives discussed:  No treatment Anesthesia:    Anesthesia method:  Local infiltration   Local anesthetic:  Lidocaine 2% WITH epi Laceration details:    Location:  Face   Face location:  R cheek   Length (cm):  4   Depth (mm):  9 Pre-procedure details:    Preparation:  Patient was prepped and draped in usual sterile fashion Exploration:    Limited defect created (wound extended): no     Hemostasis achieved with:  Direct pressure   Imaging outcome: foreign body not noted     Wound extent: no areolar tissue violation noted, no fascia violation noted, no foreign bodies/material noted, no muscle damage noted, no nerve damage noted, no underlying fracture noted and no vascular damage noted     Contaminated: no   Treatment:    Area cleansed with:  Povidone-iodine   Amount of cleaning:  Standard   Irrigation solution:  Sterile saline   Irrigation method:  Syringe   Visualized foreign bodies/material removed: no     Debridement:  None   Undermining:  None Skin repair:    Repair method:  Sutures   Suture size:  4-0   Suture material:  Prolene   Suture technique:  Simple interrupted   Number of sutures:  5 Approximation:    Approximation:  Loose Repair type:    Repair type:  Simple   Medications Ordered in ED Medications  oxyCODONE-acetaminophen (PERCOCET/ROXICET) 5-325 MG per tablet 1 tablet (1 tablet Oral Given  07/30/21 2136)  lidocaine-EPINEPHrine (XYLOCAINE W/EPI) 2 %-1:200000 (PF) injection 10 mL (10 mLs Infiltration Given 07/30/21 2252)  acetaminophen (TYLENOL) tablet 650 mg (650 mg Oral Given 07/31/21 0014)    ED Course  I have reviewed the triage vital signs and the nursing notes.  Pertinent  labs & imaging results that were available during my care of the patient were reviewed by me and considered in my medical decision making (see chart for details).    MDM Rules/Calculators/A&P                            Patient Vitals for the past 24 hrs:  BP Temp Temp src Pulse Resp SpO2  07/31/21 0122 (!) 138/98 -- -- 95 18 95 %  07/31/21 0023 (!) 139/111 -- -- 98 18 94 %  07/30/21 2100 113/86 -- -- 75 14 92 %  07/30/21 2030 121/85 -- -- 72 11 91 %  07/30/21 2000 118/89 -- -- 72 (!) 28 95 %  07/30/21 1945 123/89 -- -- 76 (!) 24 96 %  07/30/21 1930 120/90 -- -- 73 20 97 %  07/30/21 1915 (!) 125/91 -- -- 85 (!) 24 99 %  07/30/21 1912 120/89 98.1 F (36.7 C) Oral 78 18 98 %  07/30/21 1900 120/89 -- -- 78 (!) 23 93 %  07/30/21 1845 120/85 -- -- 77 20 96 %  07/30/21 1830 121/89 -- -- 79 -- 96 %  07/30/21 1826 125/87 98.4 F (36.9 C) Oral 85 17 96 %    At the time of discharge- reevaluation with update and discussion. After initial assessment and treatment, an updated evaluation reveals he is comfortable and has no further complaints.[name]   Medical Decision Making:  This patient is presenting for evaluation of mechanical fall, which does require a range of treatment options, and is a complaint that involves a high risk of morbidity and mortality. The differential diagnoses include contusion, head injury, cervical spine injury, pre-existing medical condition contributing to fall. I decided to review old records, and in summary patient with TBI, and multiple recent falls.  He wears a helmet because of his frequent falling.  I did not require additional historical information from  anyone.  Clinical Laboratory Tests Ordered, included CBC and Metabolic panel. Review indicates normal except sodium and calcium level, glucose high. Radiologic Tests Ordered, included CT head, CT face, CT cervical spine.  I independently Visualized: Radiographic images, which show no acute abnormalities    Critical Interventions-clinical evaluation, laboratory testing, radiography, suture repair, observation and reassessment  After These Interventions, the Patient was reevaluated and was found stable for discharge.  No significant injuries.  Facial laceration sutured by me.  Doubt preceding illness contributing to falling.  Patient has frequent falls.  He is being managed at a skilled nursing facility.  CRITICAL CARE-no Performed by: [name]  Nursing Notes Reviewed/ Care Coordinated Applicable Imaging Reviewed Interpretation of Laboratory Data incorporated into ED treatment  The patient appears reasonably screened and/or stabilized for discharge and I doubt any other medical condition or other [employer] [facility] requiring further screening, evaluation, or treatment in the ED at this time prior to discharge.  Plan: Home Medications-continue usual; Home Treatments-wound care at home; return here if the recommended treatment, does not improve the symptoms; Recommended follow up-PCP for suture removal in 5 to 7 days     Final Clinical Impression(s) / ED Diagnoses Final diagnoses:  Fall, initial encounter  Contusion of face, initial encounter  Facial laceration, initial encounter  Injury of head, initial encounter  Spondylosis of cervical region without myelopathy or radiculopathy    Rx / DC Orders ED Discharge Orders     None        [name], MD 07/31/21  1224  

## 2021-07-30 NOTE — Discharge Instructions (Addendum)
There were no serious injuries found from the fall.  We sewed a wound beneath her right eye.  Keep the wound clean with soap and water daily.  The wound edges are very thin and may take a while to heal.  Typically the sutures can be removed in about a week.  For pain, use Tylenol.  You have some arthritis in your neck which may be aggravated from the injury.  Use Tylenol every 4 hours if needed for pain.  Return here, if needed.

## 2021-07-30 NOTE — ED Triage Notes (Signed)
Pt by EMS from [facility], unwitnessed mechanical fall, lac/skin tear below R eye, gauze covering, bleeding controlled. Facility states baseline safety unaware, recurrent falls/issues w safety. -LOC, - thinners, A&O1 at baseline, answers y/n. 1 noted BP 86/60 on arrival to ED, BP 121/89 on arrival to room.  Hx tbi, anoxic brain injury

## 2021-07-30 NOTE — ED Notes (Signed)
Patient transported to CT 

## 2021-07-30 NOTE — ED Notes (Signed)
Pt back from CT

## 2021-07-30 NOTE — ED Notes (Signed)
PTAR CALLED  °

## 2021-07-31 MED ORDER — ACETAMINOPHEN 325 MG PO TABS
650.0000 mg | ORAL_TABLET | Freq: Once | ORAL | Status: AC
  Administered 2021-07-31: 650 mg via ORAL
  Filled 2021-07-31: qty 2

## 2021-08-12 ENCOUNTER — Emergency Department (HOSPITAL_COMMUNITY): Payer: Medicare Other

## 2021-08-12 ENCOUNTER — Encounter (HOSPITAL_COMMUNITY): Payer: Self-pay

## 2021-08-12 ENCOUNTER — Emergency Department (HOSPITAL_COMMUNITY)
Admission: EM | Admit: 2021-08-12 | Discharge: 2021-08-12 | Disposition: A | Discharge status: Home / Self Care | Payer: Medicare Other | Attending: Student | Admitting: Student

## 2021-08-12 DIAGNOSIS — S6992XA Unspecified injury of left wrist, hand and finger(s), initial encounter: Secondary | ICD-10-CM | POA: Diagnosis present

## 2021-08-12 DIAGNOSIS — W19XXXA Unspecified fall, initial encounter: Secondary | ICD-10-CM | POA: Diagnosis not present

## 2021-08-12 DIAGNOSIS — S61211A Laceration without foreign body of left index finger without damage to nail, initial encounter: Secondary | ICD-10-CM | POA: Diagnosis not present

## 2021-08-12 DIAGNOSIS — F0390 Unspecified dementia without behavioral disturbance: Secondary | ICD-10-CM | POA: Insufficient documentation

## 2021-08-12 DIAGNOSIS — Z91040 Latex allergy status: Secondary | ICD-10-CM | POA: Insufficient documentation

## 2021-08-12 DIAGNOSIS — Z79899 Other long term (current) drug therapy: Secondary | ICD-10-CM | POA: Diagnosis not present

## 2021-08-12 DIAGNOSIS — Y92129 Unspecified place in nursing home as the place of occurrence of the external cause: Secondary | ICD-10-CM | POA: Diagnosis not present

## 2021-08-12 DIAGNOSIS — Z7982 Long term (current) use of aspirin: Secondary | ICD-10-CM | POA: Diagnosis not present

## 2021-08-12 DIAGNOSIS — S0083XA Contusion of other part of head, initial encounter: Secondary | ICD-10-CM | POA: Insufficient documentation

## 2021-08-12 DIAGNOSIS — E039 Hypothyroidism, unspecified: Secondary | ICD-10-CM | POA: Diagnosis not present

## 2021-08-12 MED ORDER — LIDOCAINE HCL (PF) 1 % IJ SOLN
10.0000 mL | Freq: Once | INTRAMUSCULAR | Status: AC
  Administered 2021-08-12: 10 mL
  Filled 2021-08-12: qty 30

## 2021-08-12 NOTE — ED Notes (Signed)
PA-C at the bedside to evaluate.  

## 2021-08-12 NOTE — ED Notes (Signed)
Report called to Renita K., LPN at [facility].

## 2021-08-12 NOTE — ED Triage Notes (Signed)
Pt presents to the ED from [facility] following a fall. The fall was witnessed and nursing staff states the pt was able to catch himself. Pt did not hit his head and is not on blood thinners, per EMS. Per EMS, the pt has a hx of TBI. There is a left index finger lac. Hx provided by EMS.

## 2021-08-12 NOTE — ED Notes (Signed)
PTAR paged for transport 

## 2021-08-12 NOTE — ED Provider Notes (Signed)
[facility]-EMERGENCY DEPT Provider Note   CSN: [id number] Arrival date & time: 08/12/21  1962     History Chief Complaint  Patient presents with   Fall    Left index finger lac    [name] is a 55 y.o. male.  HPI  Patient presents with fall and left index finger laceration.  Level 5 caveat applies due to TBI.  He was at his nursing home and he had a fall where he landed and caught himself with his left hand.  Patient does not express any pain anywhere else, no obvious lacerations anywhere else.  He had a fall 07/29/21 from which he still has bruising on his face from.  Tetanus is up-to-date. Not on blood thinners.  Spoke with Renada at [facility] nursing home, she states the fall happened after he was given his meds.  She did not witness the fall.  Does not believe he hit his head.  He has not been complaining of any changes in his behavior the last few weeks.  No changes in medicine or in his behavior.  They state he has been having more accidents requiring medical attention recently, but that he is not falling more than average.  Past Medical History:  Diagnosis Date   Addison disease (HCC)    Anoxic brain injury (HCC)    TBI   Anxiety    Aspiration pneumonia (HCC)    Atrial fibrillation (HCC)    Chronic constipation 11/23/2011   Dementia (HCC)    Dysphagia    Encephalopathy    Frontal lobe syndrome    Gait abnormality    freq falls   Glaucoma    Glucocorticoid deficiency (HCC)    History of heart attack    History of recurrent UTIs    Hyperlipemia    Hypothyroidism    Mental disorder    Myocardial infarction (HCC)    Pneumoperitoneum of unknown etiology 12/21/2011   Quadriplegia (HCC)    Reflux    Seizure disorder (HCC)    Weakness of both legs     Patient Active Problem List   Diagnosis Date Noted   PEG Gastrostomy tube in place (HCC) 01/26/2018   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 01/26/2018 01/26/2018    Bedridden 01/26/2018   Quadriplegia (HCC)    Abdominal wall abscess 01/25/2018   Fracture of humerus anatomical neck, right, closed, initial encounter 10/03/2017   AKI (acute kidney injury) (HCC) 10/03/2017   UTI (urinary tract infection) 10/03/2017   Agitation 07/03/2014   Cardiomyopathy, ischemic 06/18/2014   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   Acute psychosis (HCC) 04/19/2014   Unspecified hypothyroidism 05/16/2013   Constipation 05/16/2013   Anemia of other chronic disease 05/16/2013   Mental disorder 01/03/2013   Addisons disease (HCC) 01/03/2013   PEG (percutaneous endoscopic gastrostomy) adjustment/replacement/removal (HCC) 12/21/2011   Hypokalemia 11/25/2011   Thrombocytopenia (HCC) 11/25/2011   FTT (failure to thrive) in adult 11/25/2011   Grand mal seizure (HCC) 11/23/2011   Depression (emotion) 11/23/2011   History of atrial fibrillation 11/23/2011   Other specified hemorrhagic conditions (HCC) 11/23/2011   Glaucoma 11/23/2011   Hypoglycemia 11/23/2011   Chronic constipation 11/23/2011   Anoxic brain injury (HCC) 11/12/2011   Protein calorie malnutrition (HCC) 11/12/2011   Hyperchloremia 11/12/2011   Hyperthyroidism 11/12/2011   Acute lower UTI 11/12/2011   Dysphagia     Past Surgical History:  Procedure Laterality Date   DEBRIDEMENT OF ABDOMINAL WALL ABSCESS  01/26/2018   HERNIA  MESH REMOVAL  01/26/2018   Infected mesh - removed w I&D abd wall abscess   IR GENERIC HISTORICAL  10/20/2016   IR REPLC GASTRO/COLONIC TUBE PERCUT W/FLUORO 10/20/2016 Oley Balm, MD [facility]INTER[facility]   IR REPLACE G-TUBE SIMPLE WO FLUORO  01/28/2018   IR REPLC GASTRO/COLONIC TUBE PERCUT W/FLUORO  04/21/2017   IR REPLC GASTRO/COLONIC TUBE PERCUT W/FLUORO  07/22/2017   IR REPLC GASTRO/COLONIC TUBE PERCUT W/FLUORO  11/24/2017   IR REPLC GASTRO/COLONIC TUBE PERCUT W/FLUORO  08/12/2020   IRRIGATION AND DEBRIDEMENT ABSCESS N/A 01/26/2018   Procedure: IRRIGATION AND DEBRIDEMENT ABDOMINAL WALL  ABSCESS WITH REMOVAL OF INFECTED MESH;  Surgeon: Karie Soda, MD;  Location: [facility] ORS;  Service: General;  Laterality: N/A;   NEPHRECTOMY  unknown   PEG TUBE PLACEMENT         History reviewed. No pertinent family history.  Social History   Tobacco Use   Smoking status: Never   Smokeless tobacco: Never  Vaping Use   Vaping Use: Never used  Substance Use Topics   Alcohol use: No   Drug use: No    Home Medications Prior to Admission medications   Medication Sig Start Date End Date Taking? Authorizing Provider  acetaminophen (TYLENOL) 325 MG tablet Place 650 mg into feeding tube every 4 (four) hours as needed for mild pain.    [provider]  Amino Acids-Protein Hydrolys (FEEDING SUPPLEMENT, PRO-STAT SUGAR FREE 64,) LIQD Place 30 mLs into feeding tube 2 (two) times daily.     [provider]  aspirin 81 MG chewable tablet Chew 81 mg by mouth daily.    [provider]  Carboxymeth-Glycerin-Polysorb (REFRESH OPTIVE ADVANCED OP) Place 1 drop into both eyes 3 (three) times daily.    [provider]  clonazePAM (KLONOPIN) 0.5 MG tablet Take 0.5 mg by mouth at bedtime.    [provider]  diclofenac Sodium (VOLTAREN) 1 % GEL Apply 4 g topically 4 (four) times daily as needed. 05/29/20   Hilts, Casimiro Needle, MD  divalproex (DEPAKOTE SPRINKLE) 125 MG capsule Take 500 mg by mouth 2 (two) times daily.  07/18/13   [provider]  levothyroxine (SYNTHROID, LEVOTHROID) 150 MCG tablet Place 150 mcg into feeding tube daily before breakfast.     [provider]  metoprolol tartrate (LOPRESSOR) 25 MG tablet Take 12.5 mg by mouth 2 (two) times daily.    [provider]  Multiple Vitamins-Minerals (CERTAVITE/ANTIOXIDANTS) LIQD 15 mLs by PEG Tube route daily at 12 noon.     [provider]  pantoprazole sodium (PROTONIX) 40 mg/20 mL PACK 40 mg by PEG Tube route daily at 12 noon.  11/30/11   [name], MD  PARoxetine (PAXIL)  10 MG tablet Take 10 mg by mouth daily.    [provider]  polyethylene glycol (MIRALAX / GLYCOLAX) packet 17 g by PEG Tube route 2 (two) times daily. Mix 17gm  In 4-8 ounces of liquid    [provider]  polyvinyl alcohol (LIQUIFILM TEARS) 1.4 % ophthalmic solution Place 1 drop into both eyes 2 (two) times daily.    [provider]  risperiDONE (RISPERDAL) 1 MG tablet Take 1 mg by mouth in the morning, at noon, in the evening, and at bedtime. 9 AM, 1 PM, 5 PM, 9 PM    [provider]  sennosides (SENOKOT) 8.8 MG/5ML syrup Place 15 mLs into feeding tube 2 (two) times daily.     [provider]  traZODone (DESYREL) 150 MG  tablet Take 75 mg by mouth at bedtime.  08/02/13   [provider]    Allergies    Ativan [lorazepam], Latex, Morphine and related, Penicillins, Pyridium [phenazopyridine hcl], and Soy allergy  Review of Systems   Review of Systems  Unable to perform ROS: Patient nonverbal   Physical Exam Updated Vital Signs BP 114/80 (BP Location: Right Arm)   Pulse 75   Temp 98.3 F (36.8 C) (Oral)   Resp 18   SpO2 97%   Physical Exam Vitals and nursing note reviewed. Exam conducted with a chaperone present.  Constitutional:      Appearance: Normal appearance.  HENT:     Head: Normocephalic.     Comments: Patient with bilateral contusions periorbitally.  Crepitus to the TMJ on exam. Eyes:     General: No scleral icterus.       Right eye: No discharge.        Left eye: No discharge.     Extraocular Movements: Extraocular movements intact.     Pupils: Pupils are equal, round, and reactive to light.  Cardiovascular:     Rate and Rhythm: Normal rate and regular rhythm.     Pulses: Normal pulses.     Heart sounds: Normal heart sounds. No murmur heard.   No friction rub. No gallop.  Pulmonary:     Effort: Pulmonary effort is normal. No respiratory distress.     Breath sounds: Normal breath sounds.  Abdominal:     General:  Abdomen is flat. Bowel sounds are normal. There is no distension.     Palpations: Abdomen is soft.     Tenderness: There is no abdominal tenderness.  Musculoskeletal:        General: Tenderness and signs of injury present.     Cervical back: Normal range of motion. Tenderness present.     Comments: 2-1/2 to 3 cm laceration to the left index finger.  Bleeding is controlled.  Radial pulse 2+, cap refill less than 2  Skin:    General: Skin is warm and dry.     Coloration: Skin is not jaundiced.  Neurological:     Mental Status: He is alert. Mental status is at baseline.     Coordination: Coordination normal.    ED Results / Procedures / Treatments   Labs (all labs ordered are listed, but only abnormal results are displayed) Labs Reviewed - No data to display  EKG None  Radiology No results found.  Procedures .Marland KitchenLaceration Repair  Date/Time: 08/12/2021 3:38 PM Performed by: Theron Arista, PA-C Authorized by: Theron Arista, PA-C   Consent:    Consent obtained:  Verbal   Consent given by:  Patient   Risks discussed:  Infection, pain, retained foreign body and poor cosmetic result Universal protocol:    Patient identity confirmed:  Verbally with patient and arm band Anesthesia:    Anesthesia method:  None Laceration details:    Location:  Finger   Finger location:  L thumb   Length (cm):  2   Depth (mm):  4 Pre-procedure details:    Preparation:  Patient was prepped and draped in usual sterile fashion Exploration:    Limited defect created (wound extended): no     Hemostasis achieved with:  Direct pressure   Imaging obtained: x-ray   Treatment:    Area cleansed with:  Povidone-iodine   Amount of cleaning:  Standard   Irrigation solution:  Sterile saline   Irrigation volume:  1 liter   Irrigation method:  Pressure wash   Visualized foreign bodies/material removed: no   Skin repair:    Repair method:  Sutures   Suture size:  5-0   Suture material:  Prolene   Suture  technique:  Simple interrupted   Number of sutures:  5 Approximation:    Approximation:  Close Repair type:    Repair type:  Simple Post-procedure details:    Dressing:  Open (no dressing)   Medications Ordered in ED Medications  lidocaine (PF) (XYLOCAINE) 1 % injection 10 mL (has no administration in time range)    ED Course  I have reviewed the triage vital signs and the nursing notes.  Pertinent labs & imaging results that were available during my care of the patient were reviewed by me and considered in my medical decision making (see chart for details).  Clinical Course as of 08/12/21 1536  Tue Aug 12, 2021  0956 DG Hand Complete Left No bony involvement, appropriate for suture repair at this time.   [HS]    Clinical Course User Index [HS] Theron AristaSage, Maleko Greulich, PA-C   MDM Rules/Calculators/A&P                           Patient vitals are stable, he has a small laceration to his left finger but does not appear to have any other pain anywhere on exam.  He has obvious facial contusions, but I am unsure if this is new or from his most recent fall on 07/29/2021 and 07/30/2021.  He also has crepitus on exam, will order CT head, cervical spine, maxillofacial.  Also get imaging of his left finger to evaluate for possible foreign body or bony involvement.  CT work-up unremarkable, no acute trauma to the face.  Will repair the thumb laceration with suture material after cleaning it.  Visualized the lac externally, does not appear to have any tendon involvement.  Patient is afebrile, do not suspect septic tenosynovitis.  Patient tolerated procedure well, 5 sutures in place. Full ROM, good cap refill. Sensation in tact.  Patient is appropriate for discharge at this time.  Final Clinical Impression(s) / ED Diagnoses Final diagnoses:  None    Rx / DC Orders ED Discharge Orders     None        Theron AristaSage, Marivel Mcclarty, Cordelia Poche-C 08/12/21 1541    Glendora ScoreKommor, [place], MD 08/12/21 2042

## 2021-08-12 NOTE — ED Notes (Addendum)
Attempted to call pt's Mother at both home and cell phone numbers listed in chart, per the pt's request as well as PTAR's request. Pt's Mother's phone numbers do not go through at this time. The call is unable to be completed. PTAR notified and PTAR aware that report has been called to [facility] by this nurse.

## 2021-08-12 NOTE — ED Notes (Signed)
Pt in imaging

## 2021-08-12 NOTE — ED Notes (Signed)
PTAR here to transport pt back to [place]. 

## 2021-08-12 NOTE — Discharge Instructions (Addendum)
Please have the sutures removed in 10 days. May wash with soap and water, I would avoid any peroxide. If you develop fever, chills, spreading redness or significant swelling please return to the ED for further evaluation.

## 2021-08-21 IMAGING — DX DG KNEE COMPLETE 4+V*R*
1 series · 4 of 4 positions shown · non-contrast
Comparison: None.

CLINICAL DATA: Bilateral knee pain after fall.

EXAM:
RIGHT KNEE - COMPLETE 4+ VIEW

[Series 1: knee · 0.14mm/px · 4 of 4 slices shown]
[im 1/4]
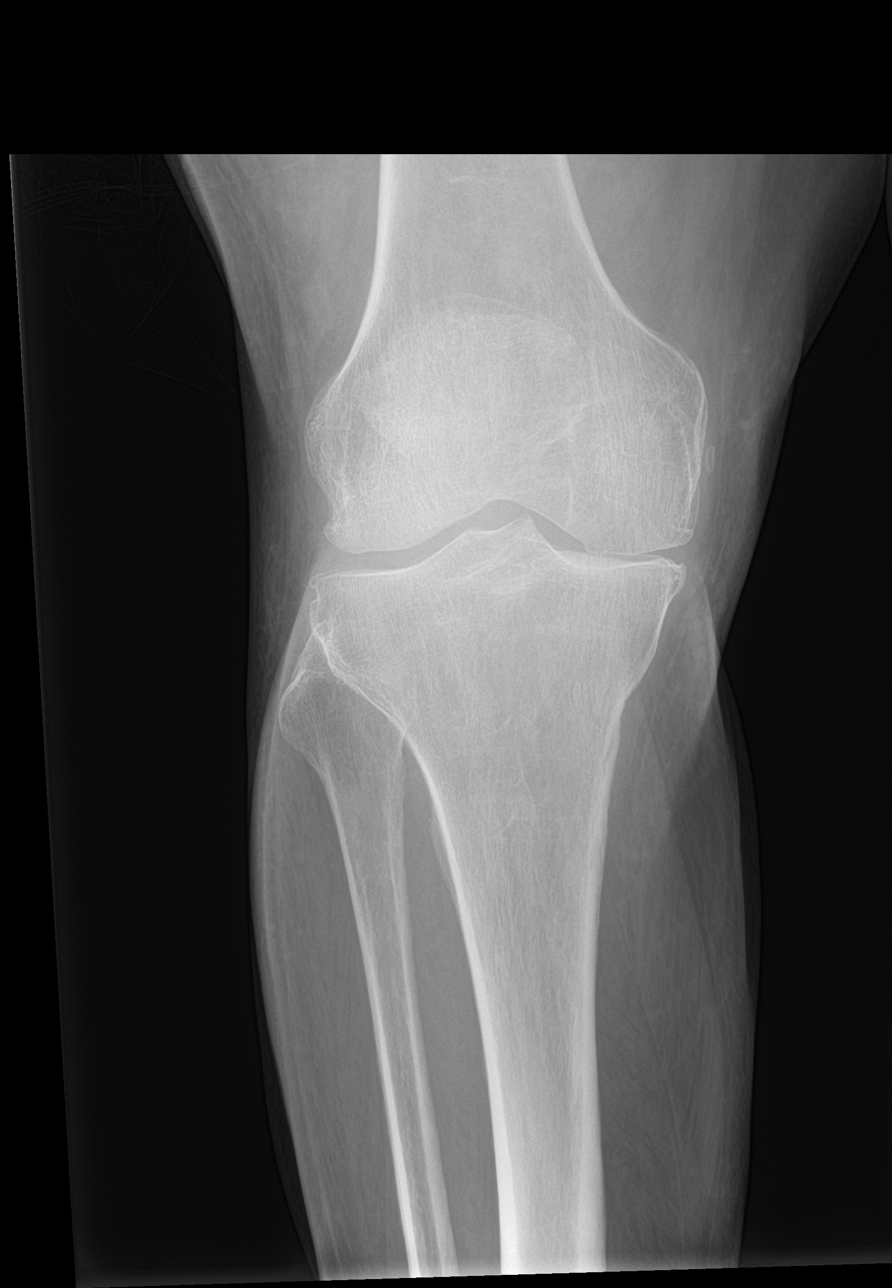
[im 2/4]
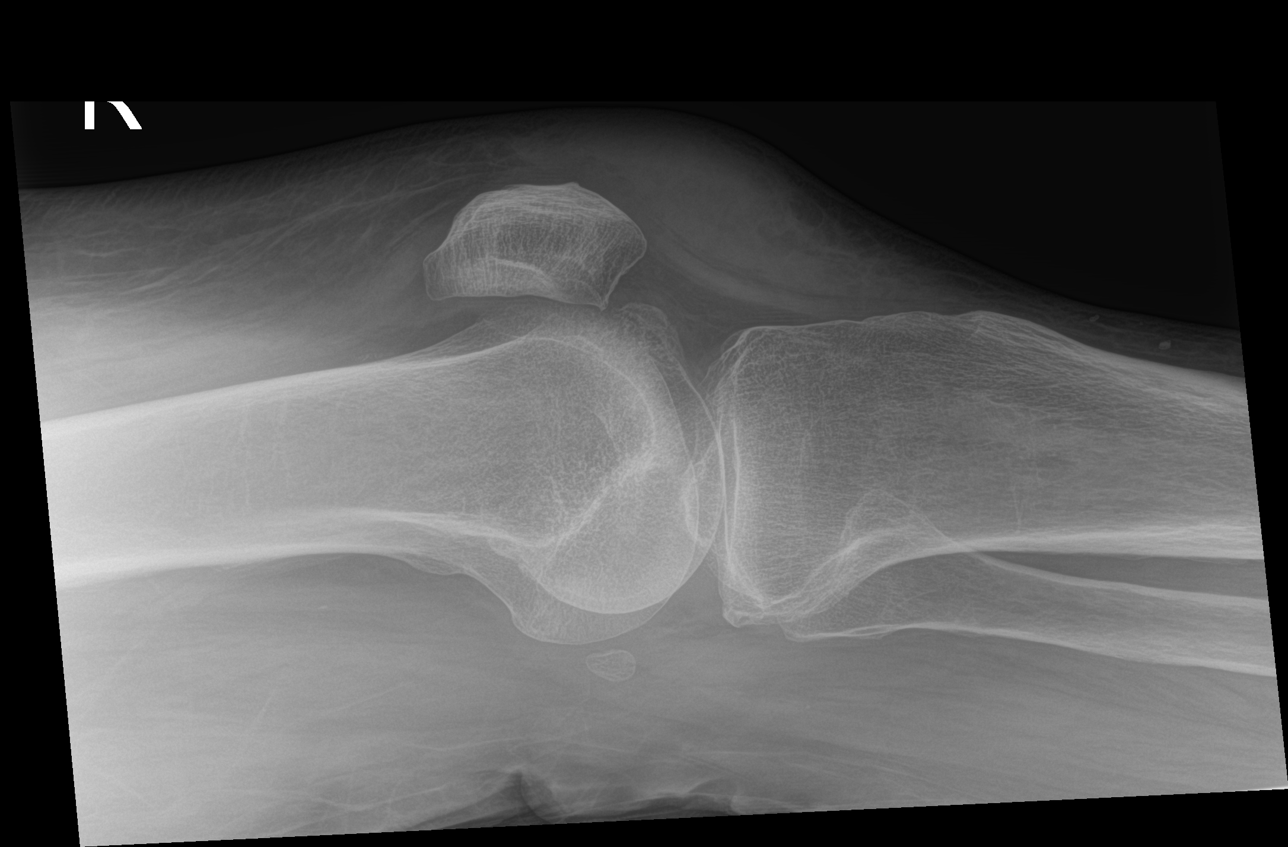
[im 3/4]
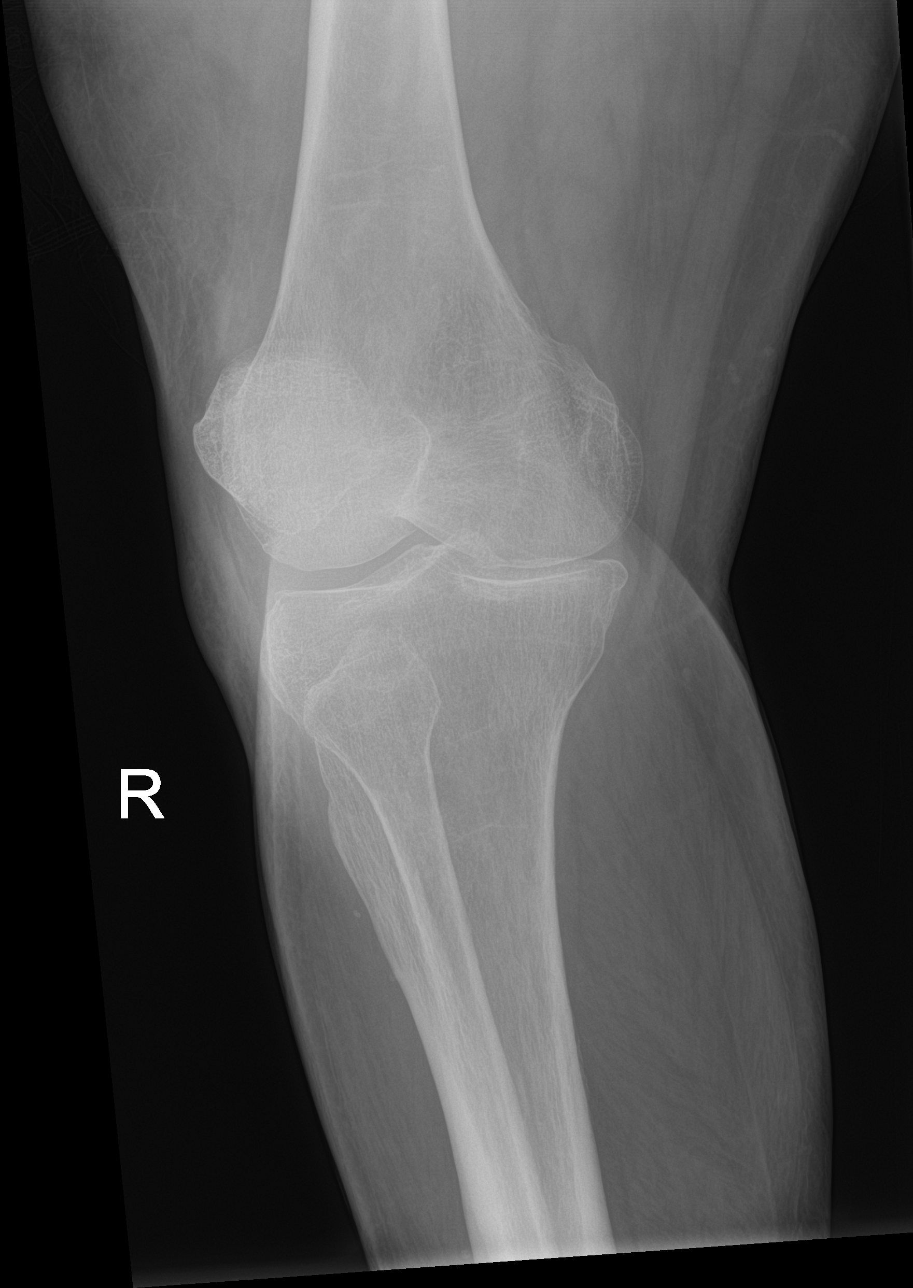
[im 4/4]
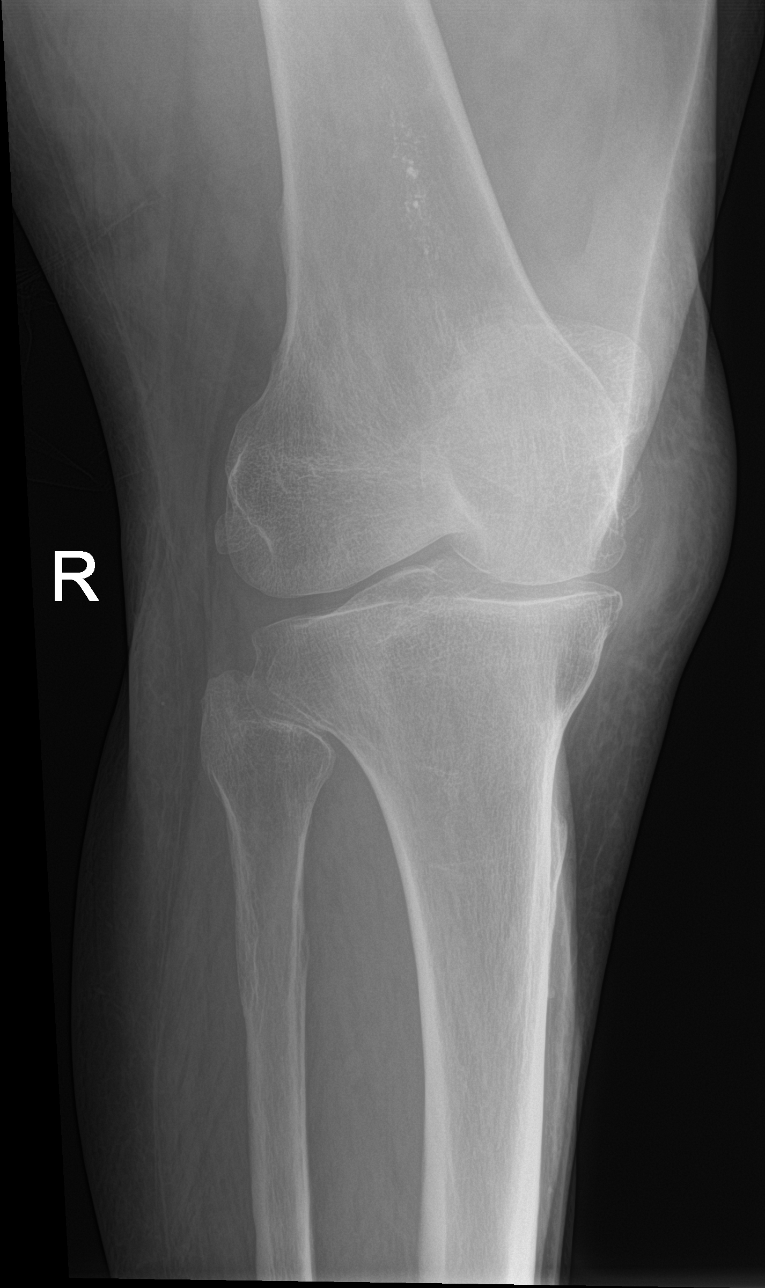

[4 of 4 positions shown; findings below may reference images not displayed]

FINDINGS: No evidence of fracture, dislocation, or joint effusion. Moderate
narrowing of medial joint space is noted. Anterior soft tissue
swelling is seen in the infrapatellar region.
IMPRESSION: Moderate degenerative joint disease is noted medially. No fracture
or dislocation is noted.

## 2021-08-21 IMAGING — DX DG KNEE COMPLETE 4+V*L*
1 series · 4 of 4 positions shown · non-contrast
Comparison: None.

CLINICAL DATA: Bilateral knee pain after fall.

EXAM:
LEFT KNEE - COMPLETE 4+ VIEW

[Series 1: knee · 0.14mm/px · 4 of 4 slices shown]
[im 1/4]
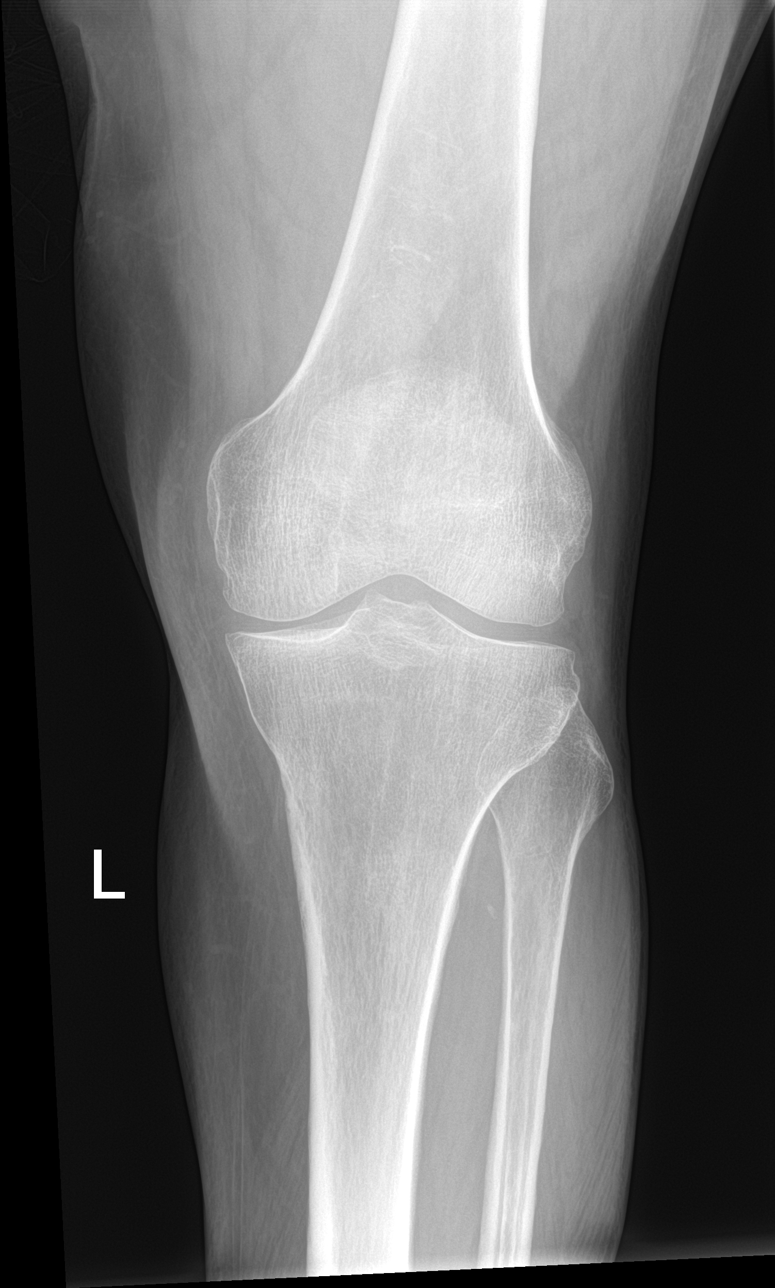
[im 2/4]
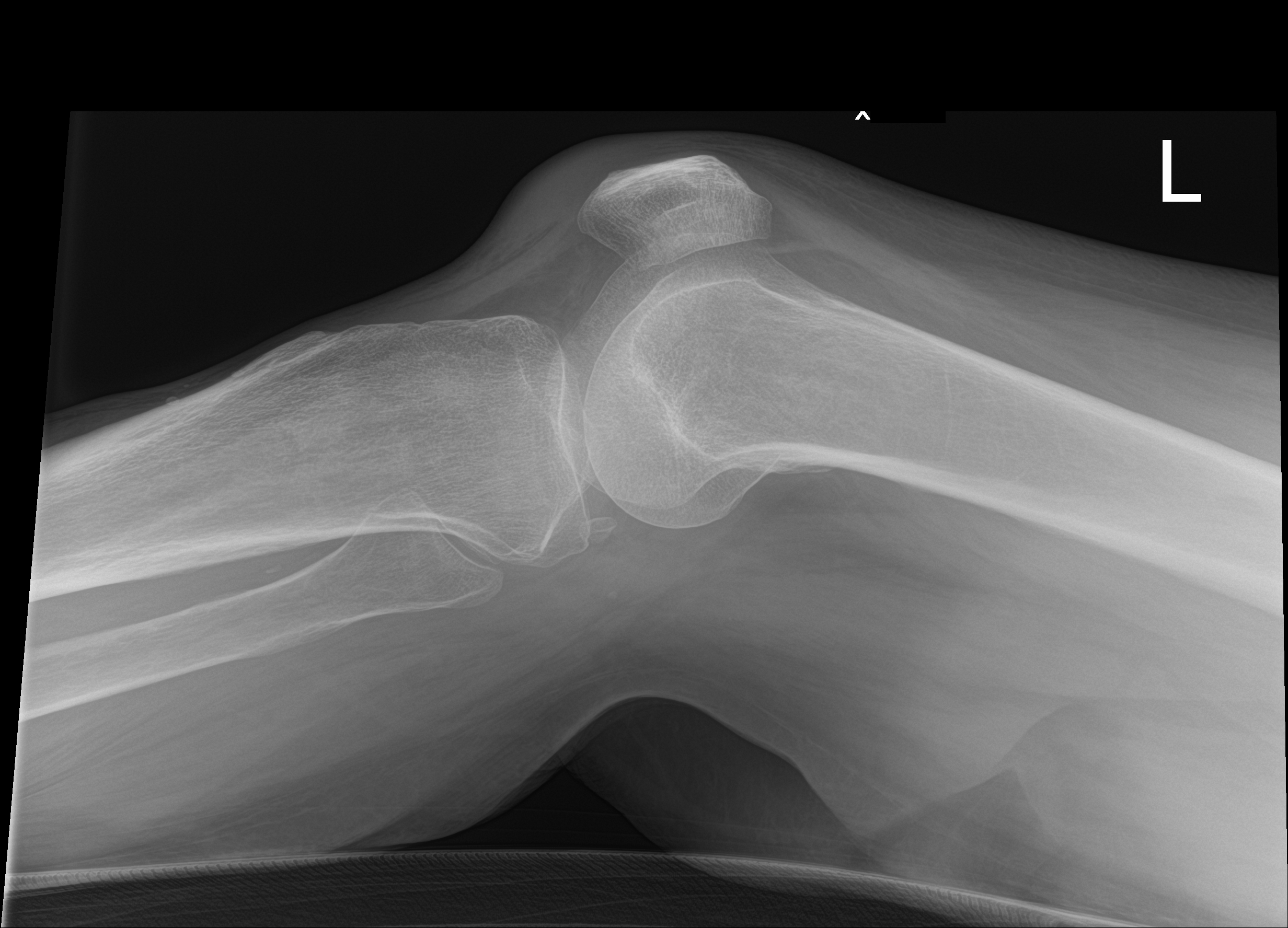
[im 3/4]
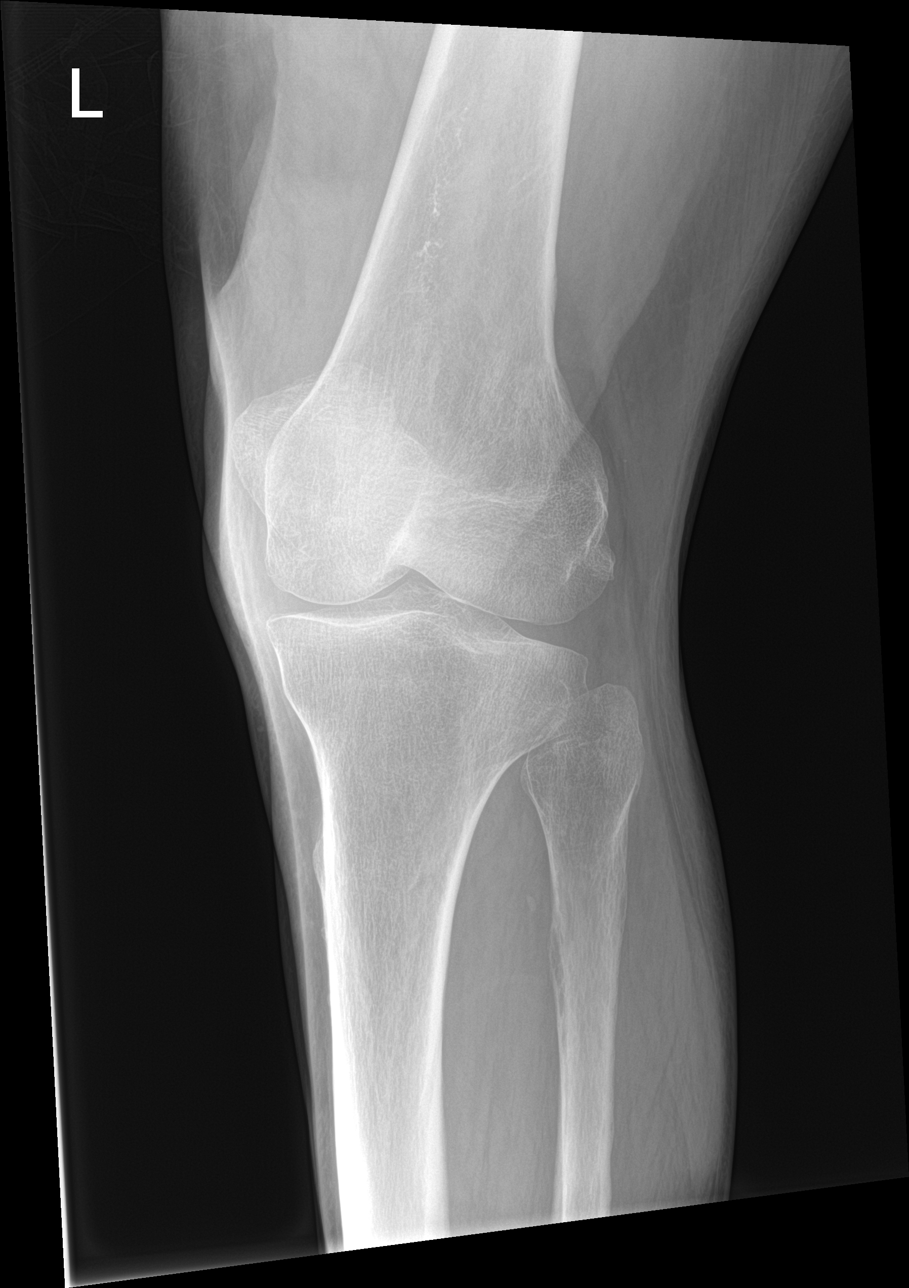
[im 4/4]
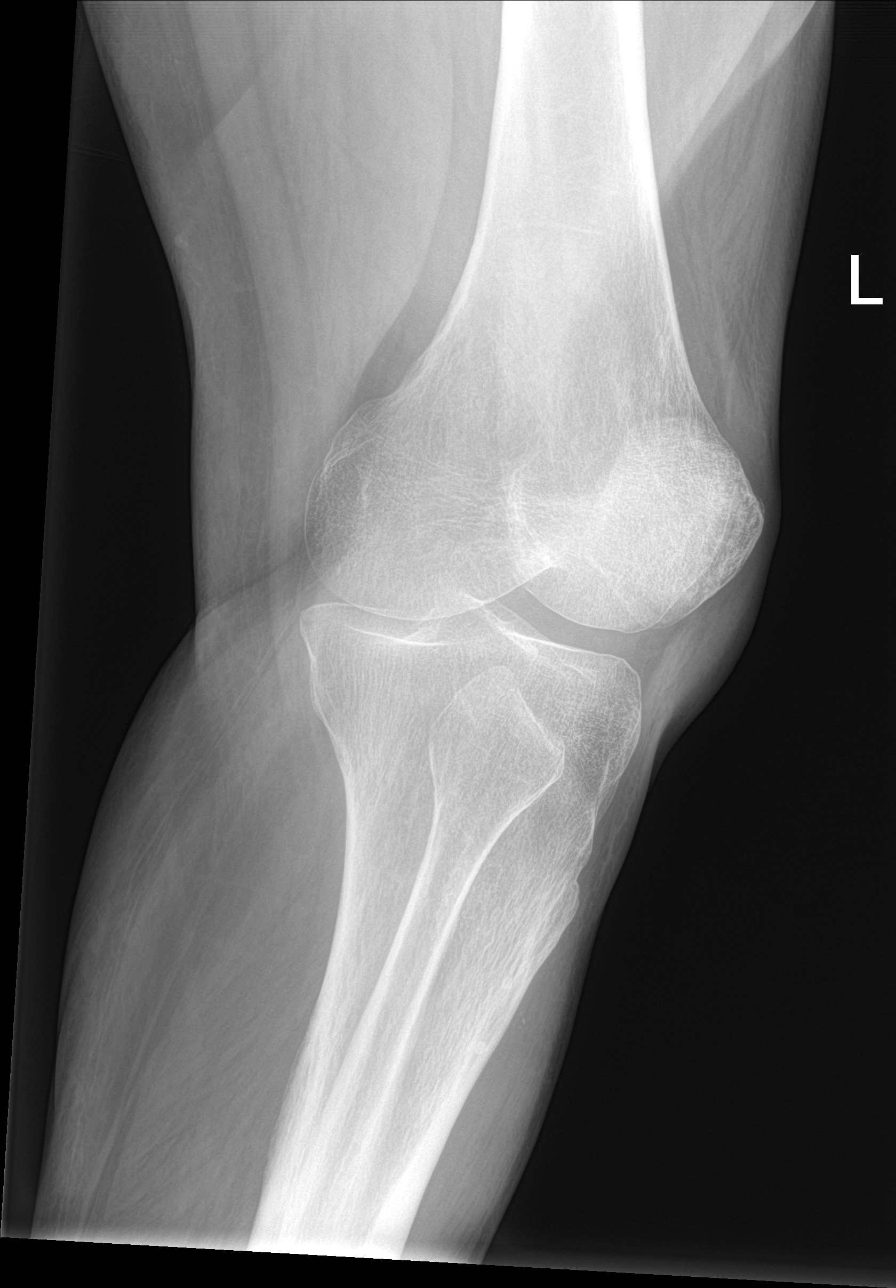

[4 of 4 positions shown; findings below may reference images not displayed]

FINDINGS: No evidence of fracture, dislocation, or joint effusion. No evidence
of arthropathy or other focal bone abnormality. Soft tissues are
unremarkable.
IMPRESSION: Negative.

## 2021-08-21 IMAGING — CT CT HEAD W/O CM
4 series · 15 of 47 positions shown, 17 images · non-contrast
Comparison: 05/09/2021.

CLINICAL DATA: Head trauma.

EXAM:
CT HEAD WITHOUT CONTRAST
CT MAXILLOFACIAL WITHOUT CONTRAST
CT CERVICAL SPINE WITHOUT CONTRAST
TECHNIQUE: Multidetector CT imaging of the head, cervical spine, and
maxillofacial structures were performed using the standard protocol
without intravenous contrast. Multiplanar CT image reconstructions
of the cervical spine and maxillofacial structures were also
generated.

[Series 1: head without · axial · non-contrast · 0.52mm/px · z∈[-62,+68]mm · 7 of 36 slices shown, 9 images]
[im 5/36  brain]
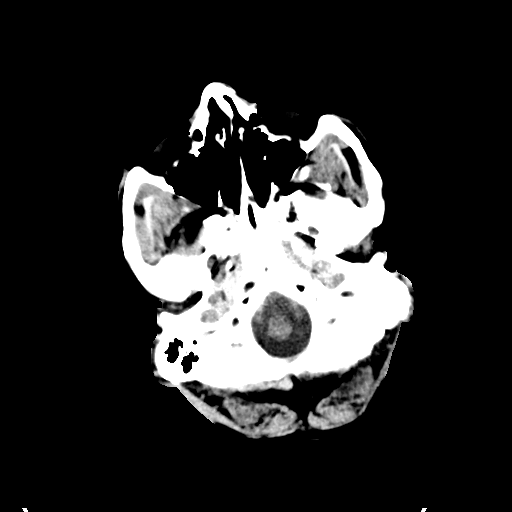
[im 5/36  bone]
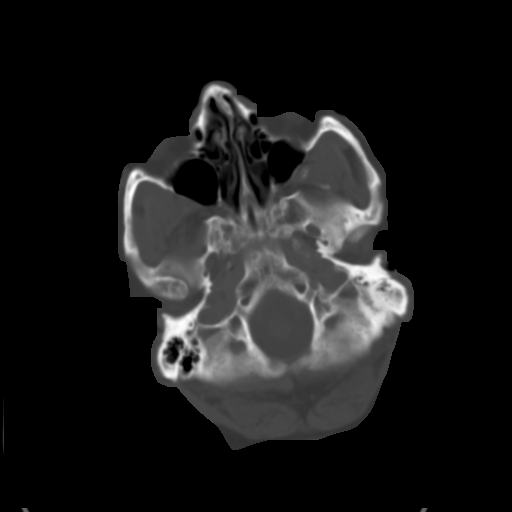
[im 9/36  brain]
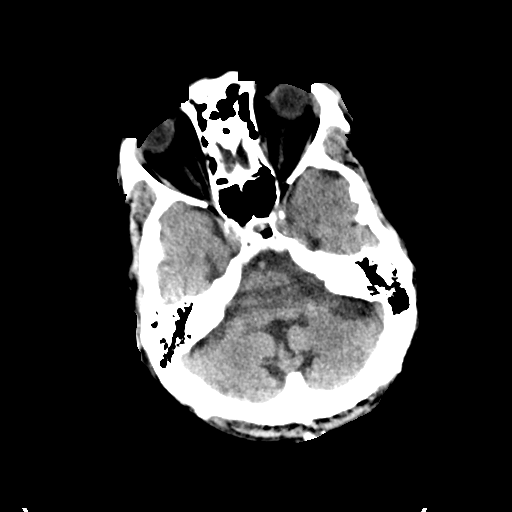
[im 14/36  brain]
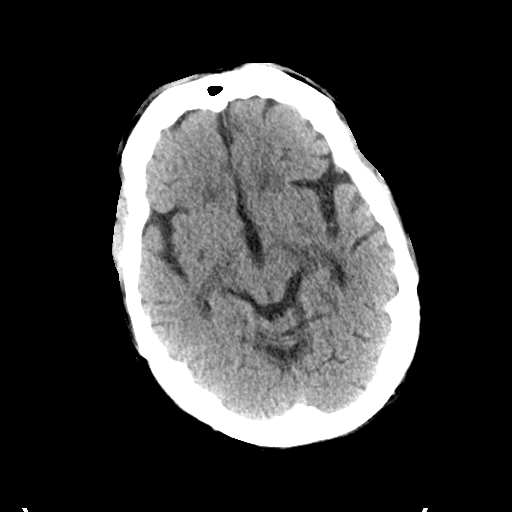
[im 18/36  brain]
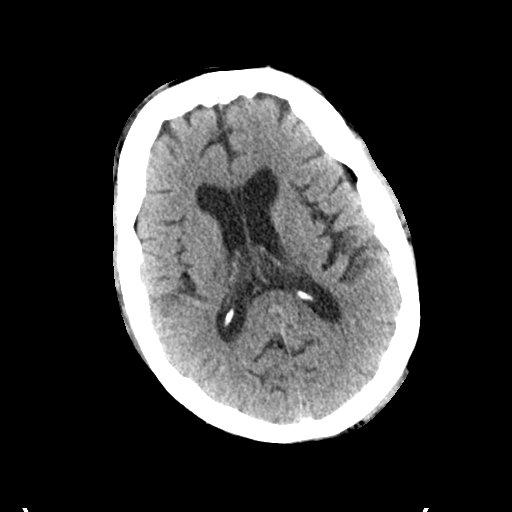
[im 22/36  brain]
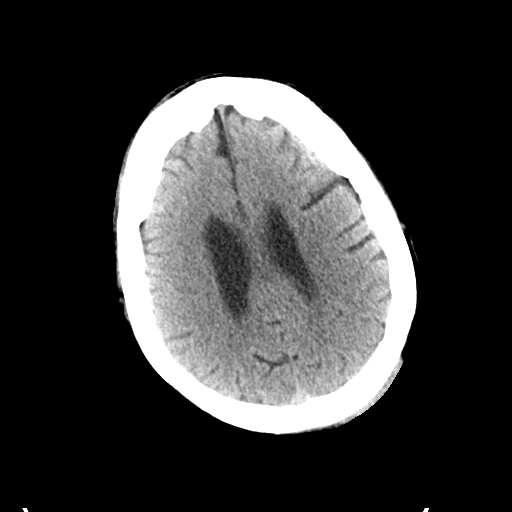
[im 22/36  bone]
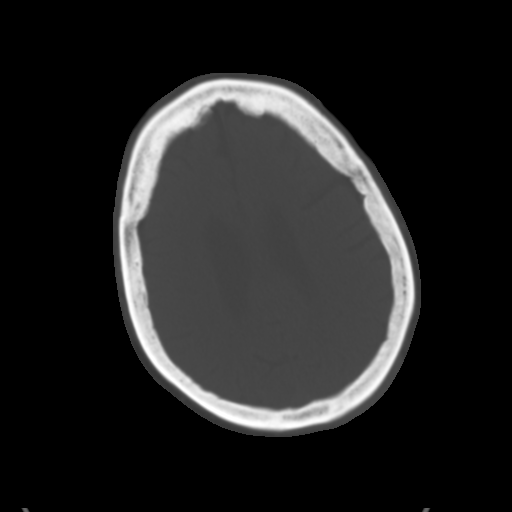
[im 27/36  brain]
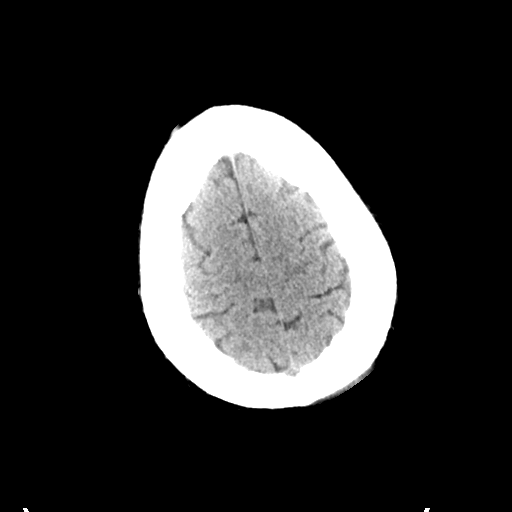
[im 31/36  brain]
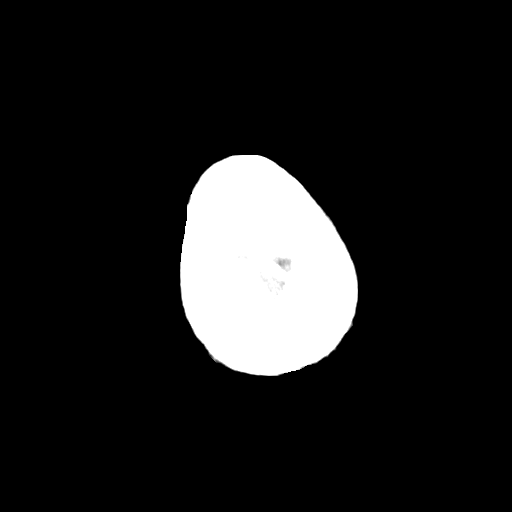

[Series 4: head bone · axial · 0.52mm/px · z∈[-66,-48]mm · 2 of 89 slices shown]
[im 9/89  bone]
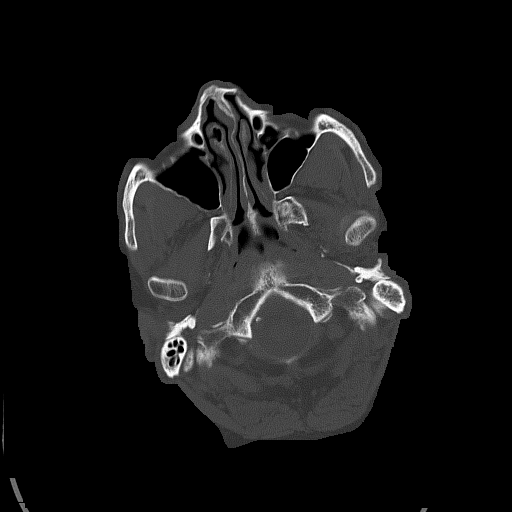
[im 18/89  bone]
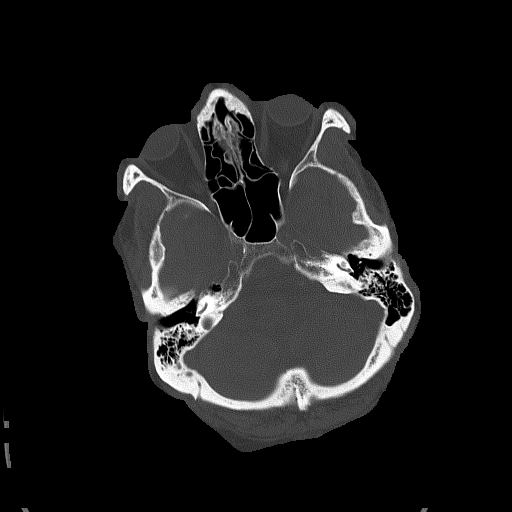

[Series 5: head without cor · coronal · non-contrast · 0.35mm/px · 3 of 78 slices shown]
[im 26/78  brain]
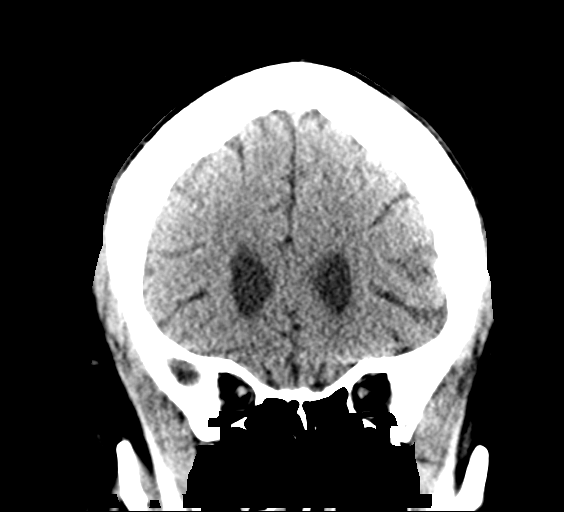
[im 35/78  brain]
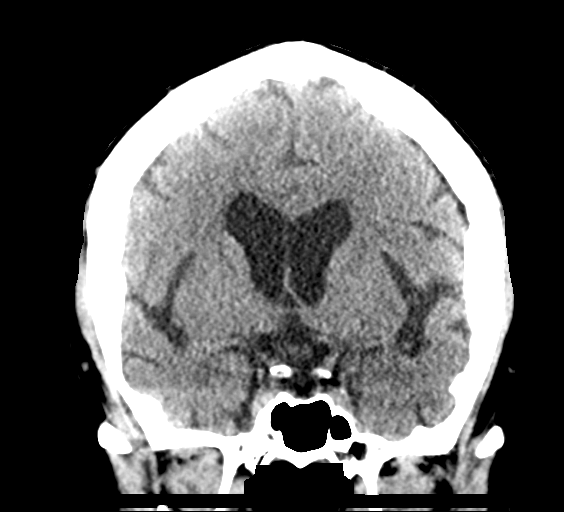
[im 43/78  brain]
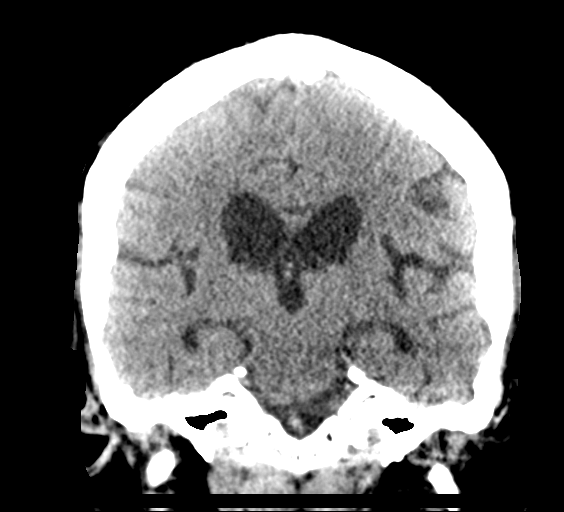

[Series 6: head without sag · sagittal · non-contrast · 0.35mm/px · 3 of 67 slices shown]
[im 23/67  brain]
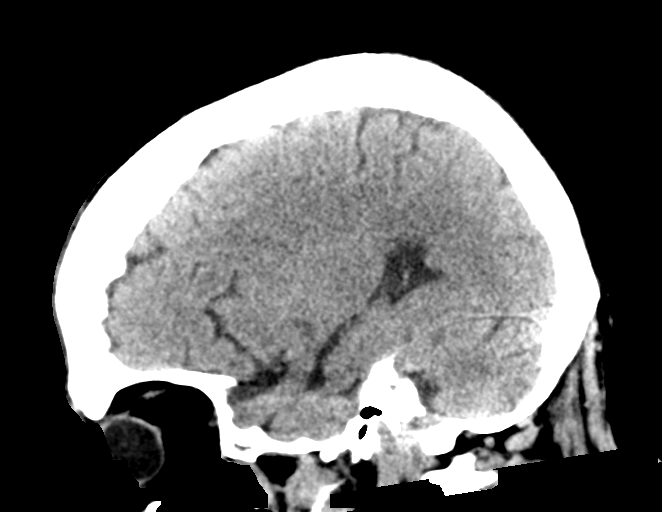
[im 34/67  brain]
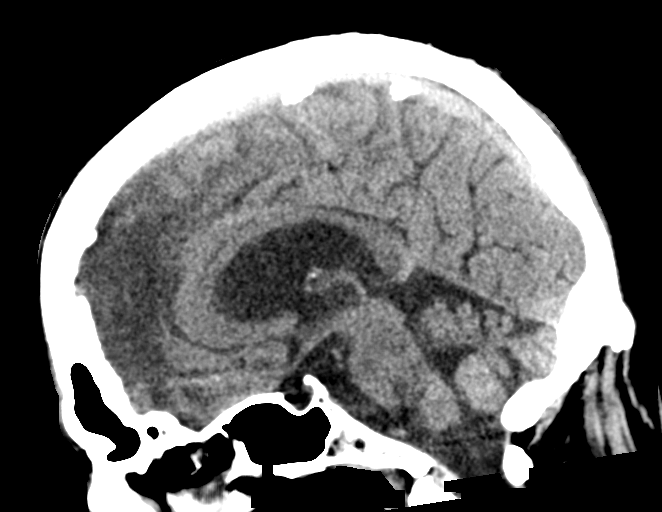
[im 45/67  brain]
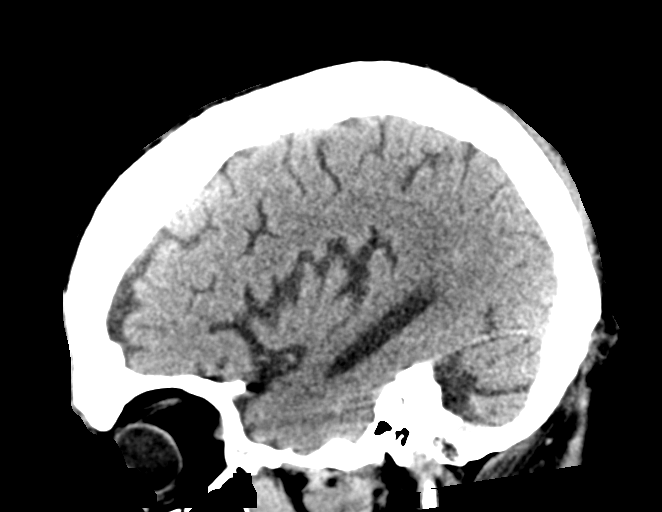

[15 of 47 positions shown; findings below may reference images not displayed]

FINDINGS: CT HEAD FINDINGS

Brain: No evidence of acute infarction, hemorrhage, hydrocephalus,
extra-axial collection or mass lesion/mass effect.

Vascular: No hyperdense vessel or unexpected calcification.

Skull: Normal. Negative for fracture or focal lesion.

Other: Small left parietal scalp hematoma.

CT MAXILLOFACIAL FINDINGS

Osseous: No acute fracture. Old nasal fractures stable from the
study dated 05/09/2021. No bone lesion.

Orbits: Negative. No traumatic or inflammatory finding.

Sinuses: Essentially clear.

Soft tissues: No soft tissue mass. No apparent contusion and no
evidence of a hematoma.

CT CERVICAL SPINE FINDINGS

Alignment: Mild reversal the normal cervical lordosis, apex at C4.
No spondylolisthesis.

Skull base and vertebrae: No acute fracture. No primary bone lesion
or focal pathologic process.

Soft tissues and spinal canal: No prevertebral fluid or swelling. No
visible canal hematoma.

Disc levels: Mild to moderate loss of disc height, C2-C3 C4-C5 with
moderate loss of disc height at C5-C6 and C6-C7. There is endplate
sclerosis, osteophytes and disc bulging at these levels. No
convincing disc herniation.

Upper chest: No acute abnormalities.

Other: None.
IMPRESSION: HEAD CT

1. No acute intracranial abnormalities.
2. No skull fracture.
3. Small left parietal scalp hematoma.

MAXILLOFACIAL CT

1. No acute fracture or acute finding.

CERVICAL CT

1. No fracture or acute finding.

## 2021-08-21 IMAGING — CT CT MAXILLOFACIAL W/O CM
3 series · 15 of 47 positions shown, 18 images · non-contrast
Comparison: 05/09/2021.

CLINICAL DATA: Head trauma.

EXAM:
CT HEAD WITHOUT CONTRAST
CT MAXILLOFACIAL WITHOUT CONTRAST
CT CERVICAL SPINE WITHOUT CONTRAST
TECHNIQUE: Multidetector CT imaging of the head, cervical spine, and
maxillofacial structures were performed using the standard protocol
without intravenous contrast. Multiplanar CT image reconstructions
of the cervical spine and maxillofacial structures were also
generated.

[Series 4: facialbone 2.0 st · axial · 0.43mm/px · z∈[-174,-14]mm · 9 of 94 slices shown, 12 images]
[im 7/94  brain]
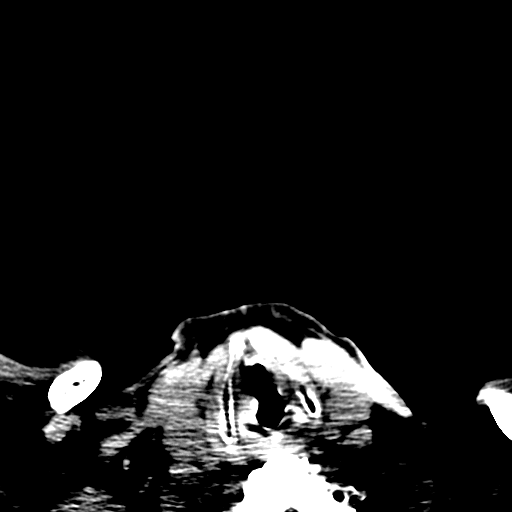
[im 7/94  bone]
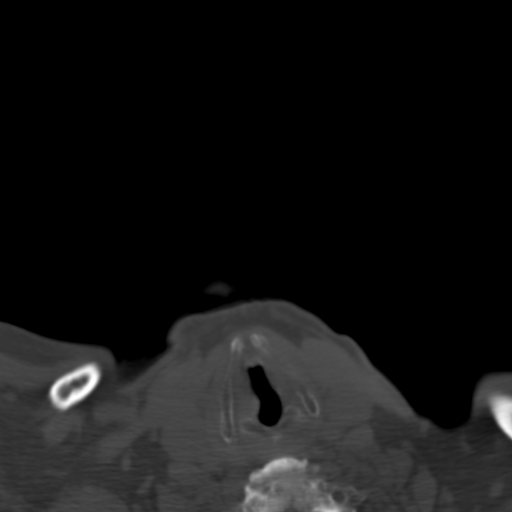
[im 17/94  bone]
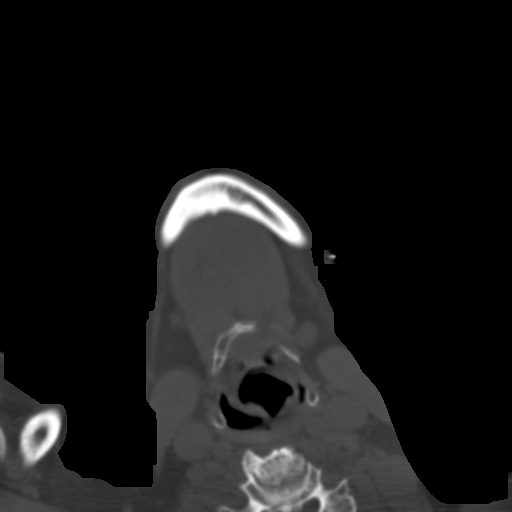
[im 26/94  bone]
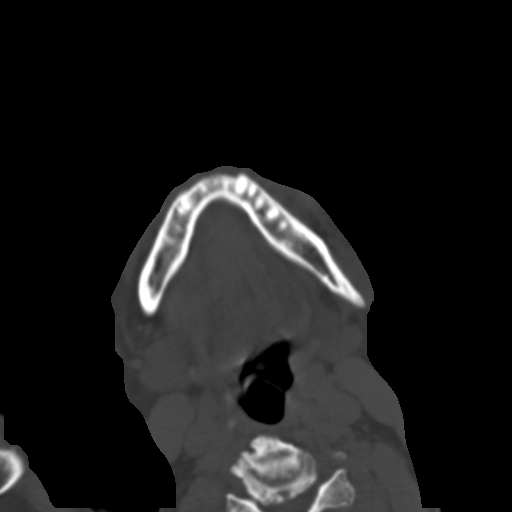
[im 36/94  bone]
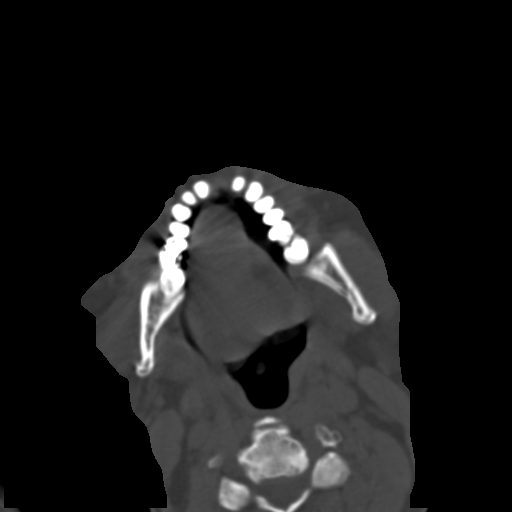
[im 49/94  brain]
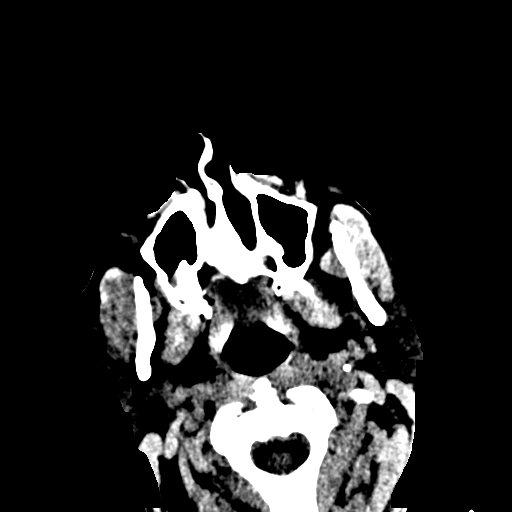
[im 49/94  bone]
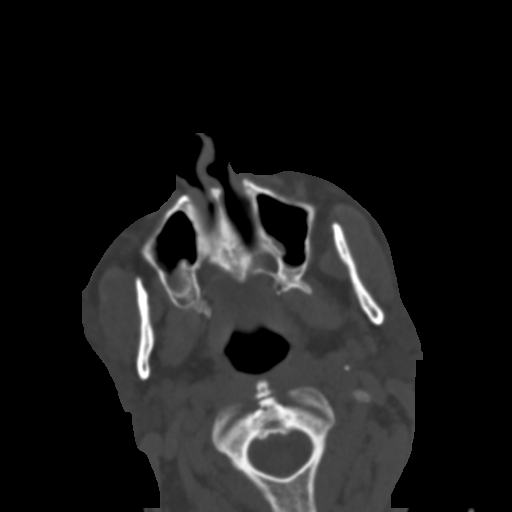
[im 58/94  bone]
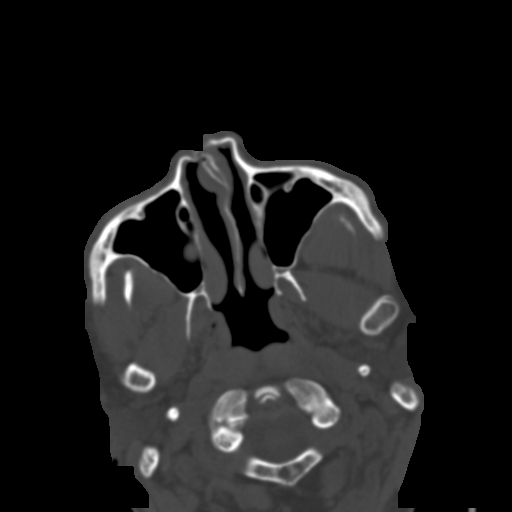
[im 68/94  bone]
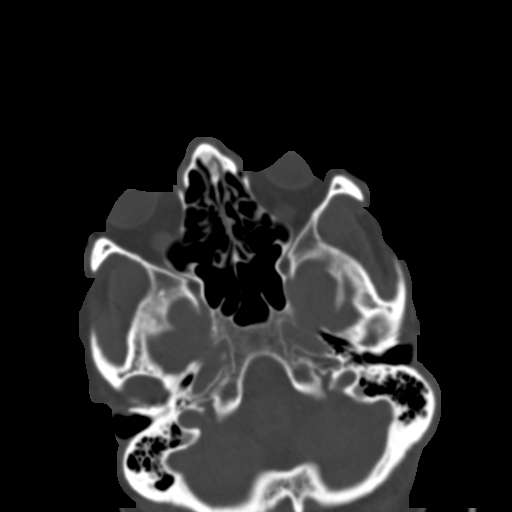
[im 77/94  bone]
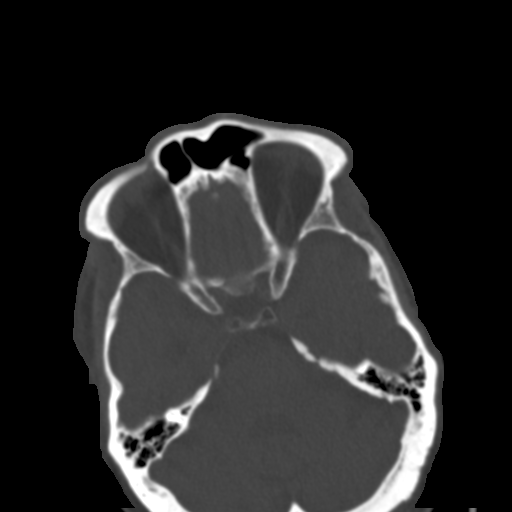
[im 87/94  brain]
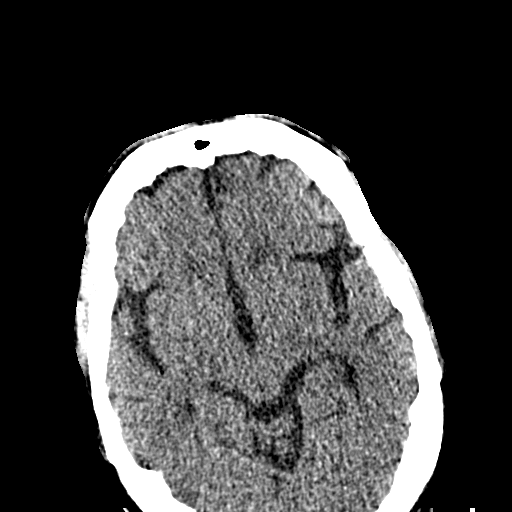
[im 87/94  bone]
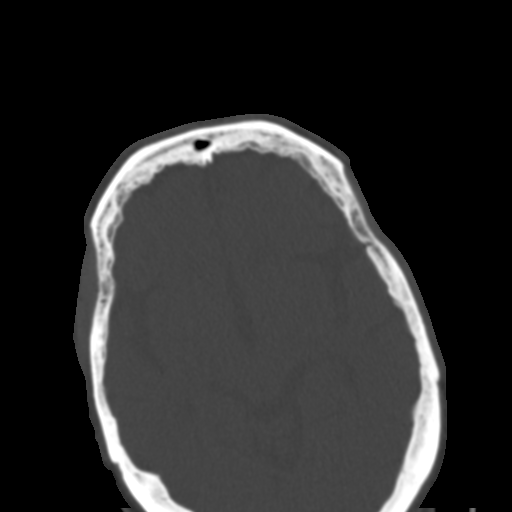

[Series 10: facialbone 2.0 cor st · coronal · 0.36mm/px · 3 of 81 slices shown]
[im 27/81  bone]
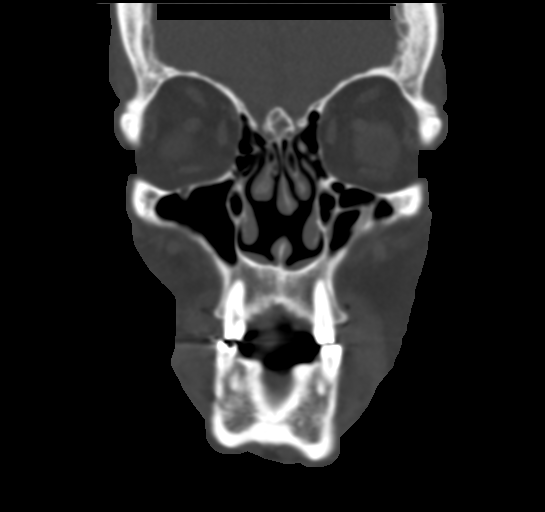
[im 36/81  bone]
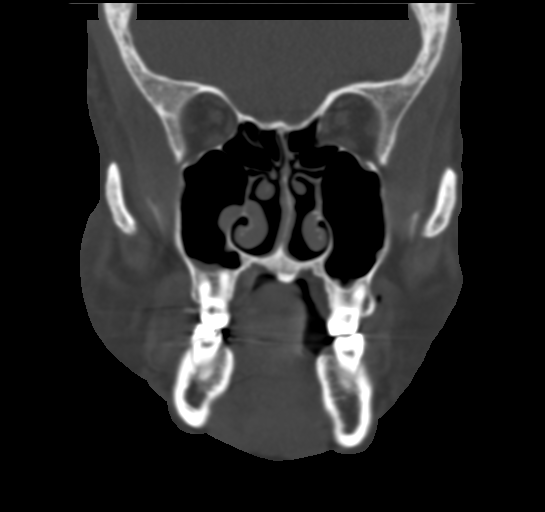
[im 45/81  bone]
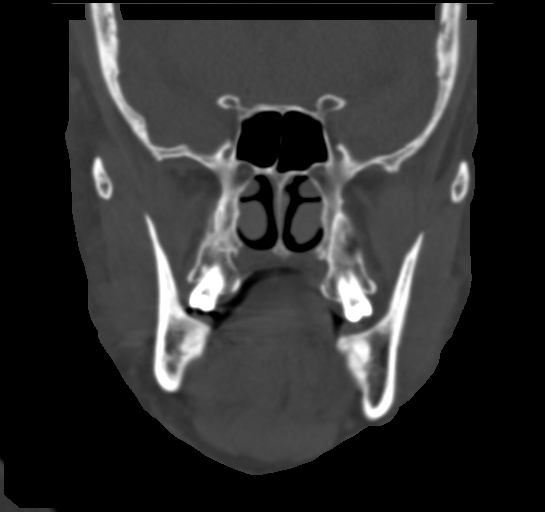

[Series 11: facialbone 2.0 sag st · sagittal · 0.34mm/px · 3 of 88 slices shown]
[im 30/88  bone]
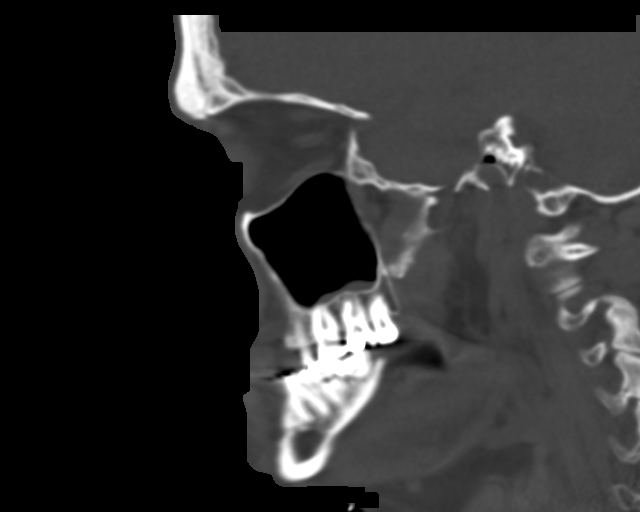
[im 44/88  bone]
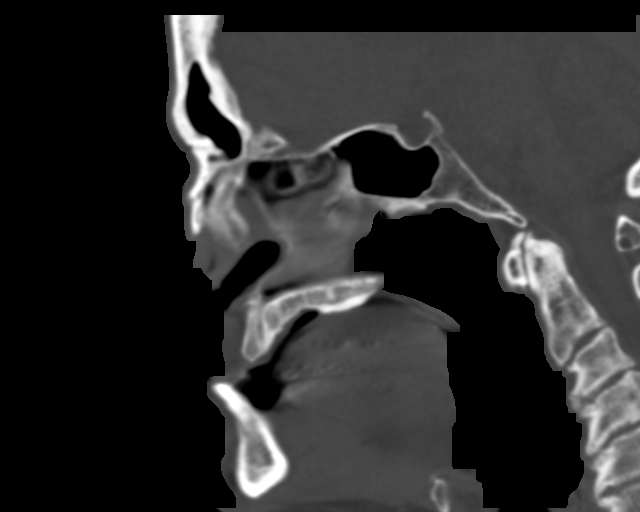
[im 59/88  bone]
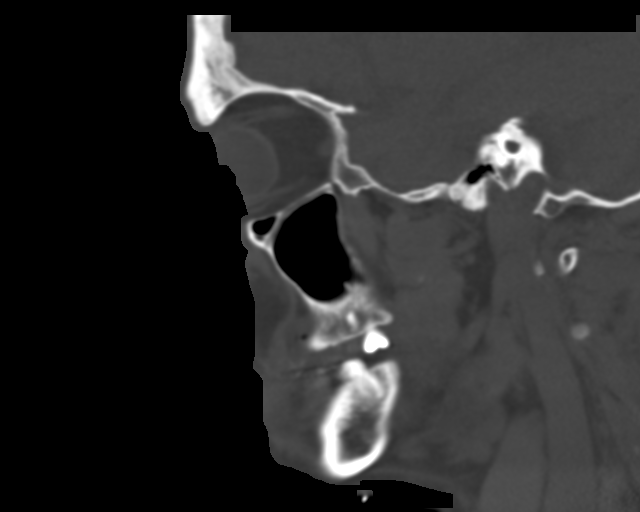

[15 of 47 positions shown; findings below may reference images not displayed]

FINDINGS: CT HEAD FINDINGS

Brain: No evidence of acute infarction, hemorrhage, hydrocephalus,
extra-axial collection or mass lesion/mass effect.

Vascular: No hyperdense vessel or unexpected calcification.

Skull: Normal. Negative for fracture or focal lesion.

Other: Small left parietal scalp hematoma.

CT MAXILLOFACIAL FINDINGS

Osseous: No acute fracture. Old nasal fractures stable from the
study dated 05/09/2021. No bone lesion.

Orbits: Negative. No traumatic or inflammatory finding.

Sinuses: Essentially clear.

Soft tissues: No soft tissue mass. No apparent contusion and no
evidence of a hematoma.

CT CERVICAL SPINE FINDINGS

Alignment: Mild reversal the normal cervical lordosis, apex at C4.
No spondylolisthesis.

Skull base and vertebrae: No acute fracture. No primary bone lesion
or focal pathologic process.

Soft tissues and spinal canal: No prevertebral fluid or swelling. No
visible canal hematoma.

Disc levels: Mild to moderate loss of disc height, C2-C3 C4-C5 with
moderate loss of disc height at C5-C6 and C6-C7. There is endplate
sclerosis, osteophytes and disc bulging at these levels. No
convincing disc herniation.

Upper chest: No acute abnormalities.

Other: None.
IMPRESSION: HEAD CT

1. No acute intracranial abnormalities.
2. No skull fracture.
3. Small left parietal scalp hematoma.

MAXILLOFACIAL CT

1. No acute fracture or acute finding.

CERVICAL CT

1. No fracture or acute finding.

## 2021-08-21 IMAGING — CT CT CERVICAL SPINE W/O CM
4 series · 15 of 33 positions shown, 18 images · non-contrast
Comparison: 05/09/2021.

CLINICAL DATA: Head trauma.

EXAM:
CT HEAD WITHOUT CONTRAST
CT MAXILLOFACIAL WITHOUT CONTRAST
CT CERVICAL SPINE WITHOUT CONTRAST
TECHNIQUE: Multidetector CT imaging of the head, cervical spine, and
maxillofacial structures were performed using the standard protocol
without intravenous contrast. Multiplanar CT image reconstructions
of the cervical spine and maxillofacial structures were also
generated.

[Series 4: c_spine 2.0 st · axial · 0.39mm/px · z∈[-192,-72]mm · 5 of 92 slices shown, 7 images]
[im 16/92  soft-tissue]
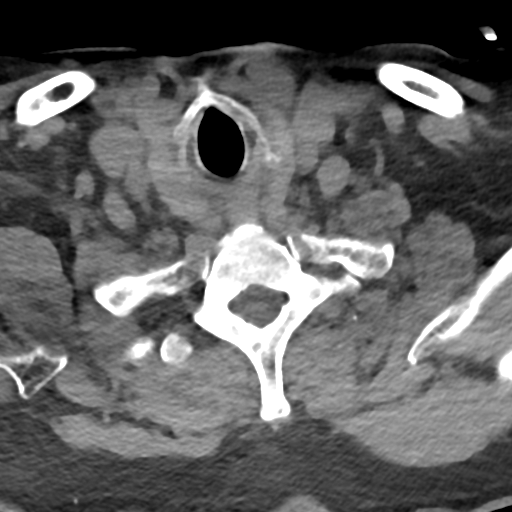
[im 16/92  bone]
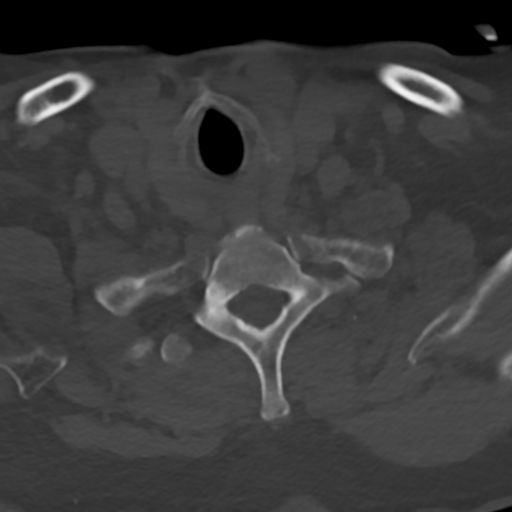
[im 31/92  bone]
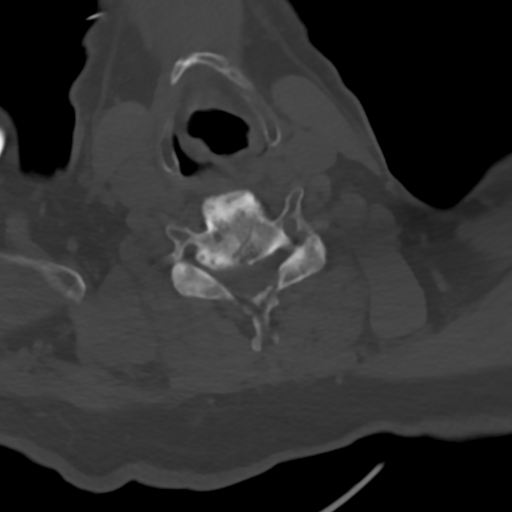
[im 46/92  bone]
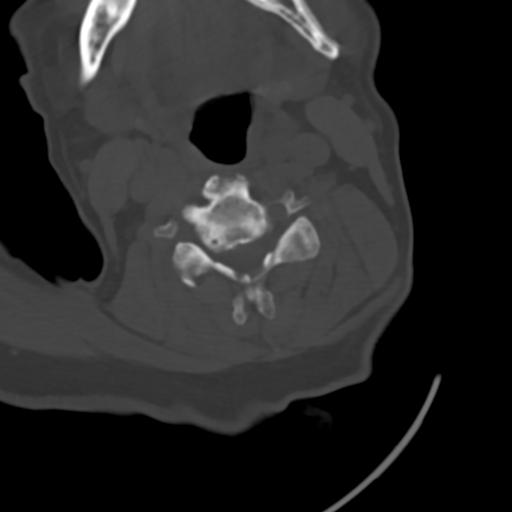
[im 61/92  bone]
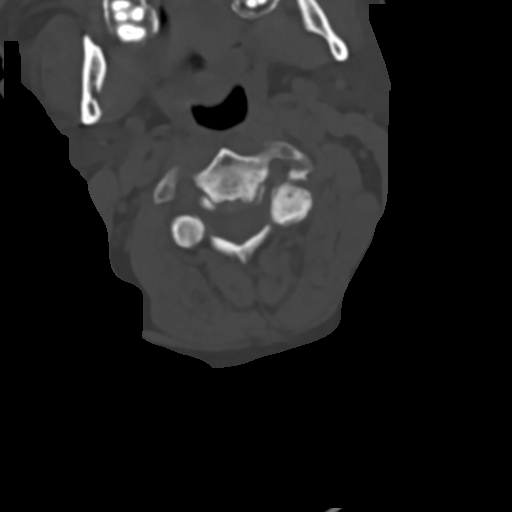
[im 76/92  soft-tissue]
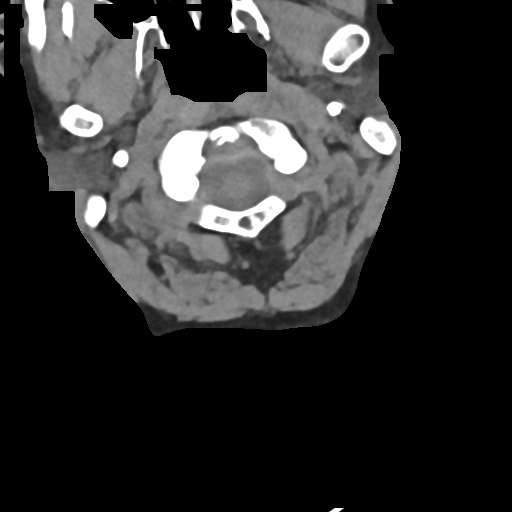
[im 76/92  bone]
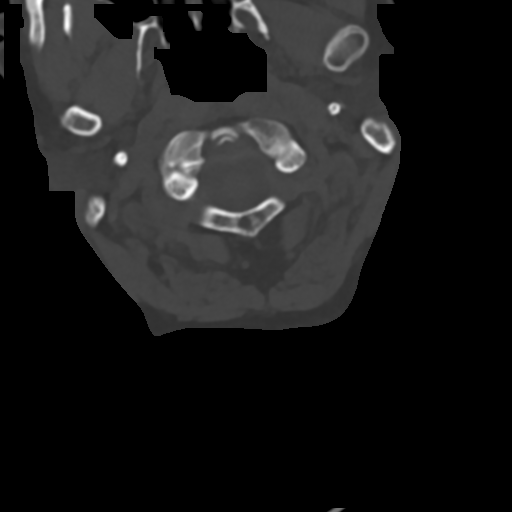

[Series 8: c_spine 2.0 sag bone · sagittal · 0.38mm/px · 5 of 73 slices shown, 6 images]
[im 25/73  bone]
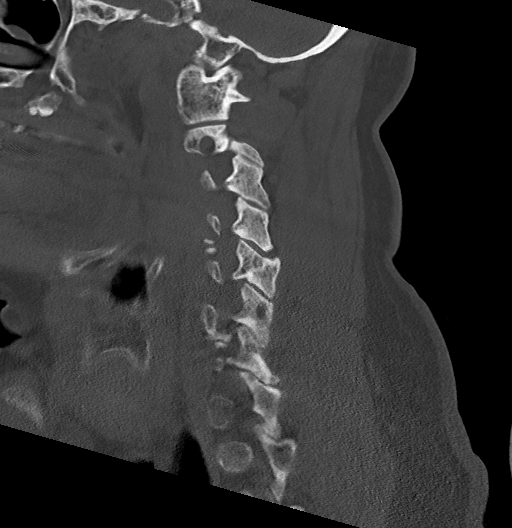
[im 31/73  bone]
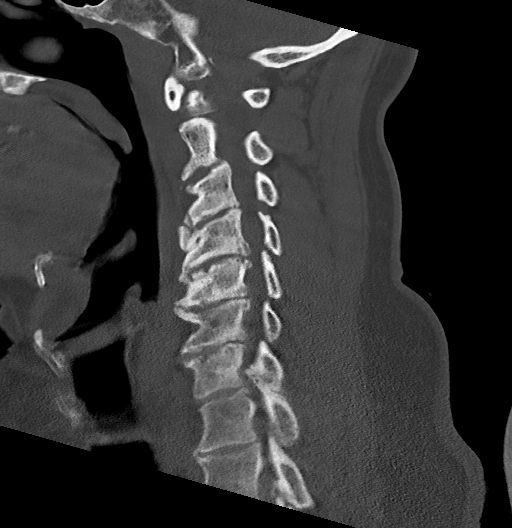
[im 37/73  soft-tissue]
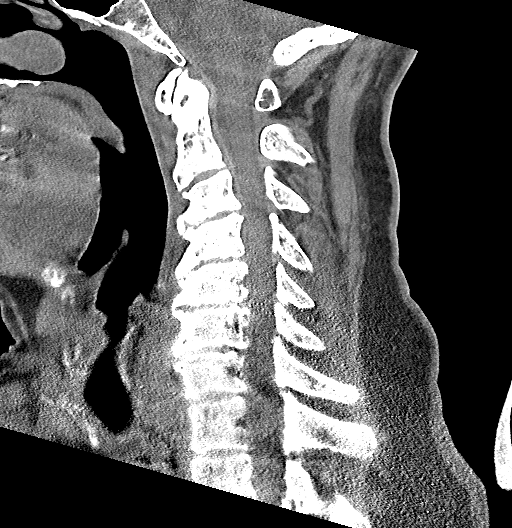
[im 37/73  bone]
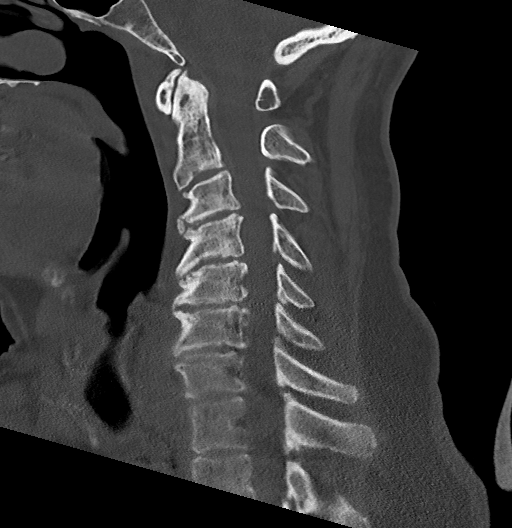
[im 43/73  bone]
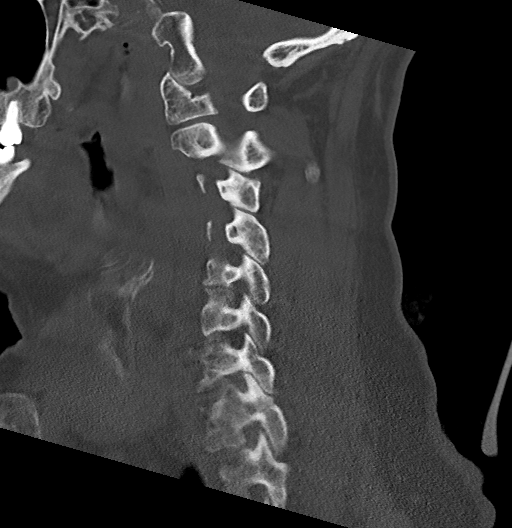
[im 49/73  bone]
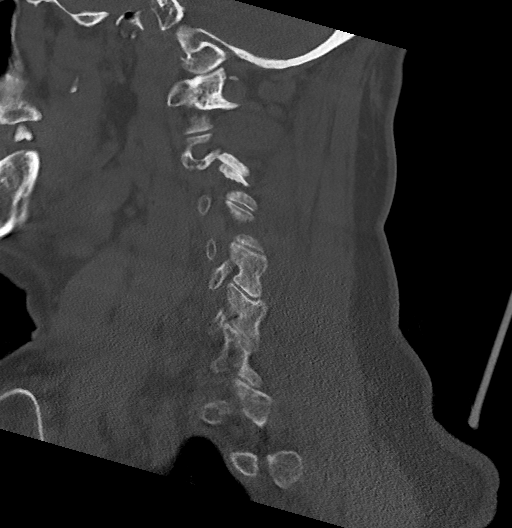

[Series 9: c_spine 2.0 cor bone · coronal · 0.36mm/px · 3 of 89 slices shown]
[im 24/89  bone]
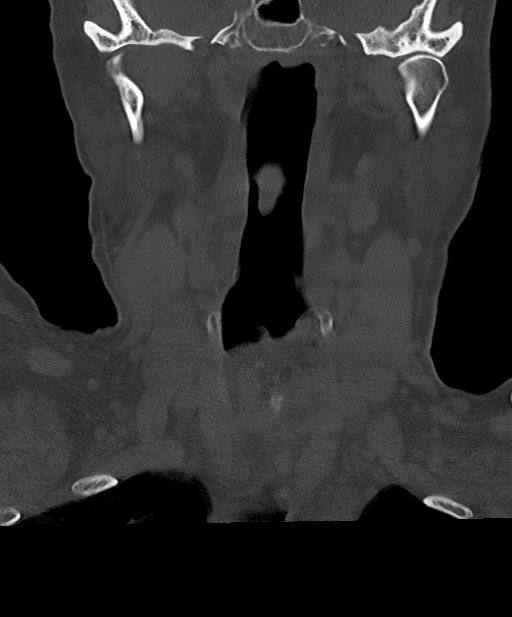
[im 38/89  bone]
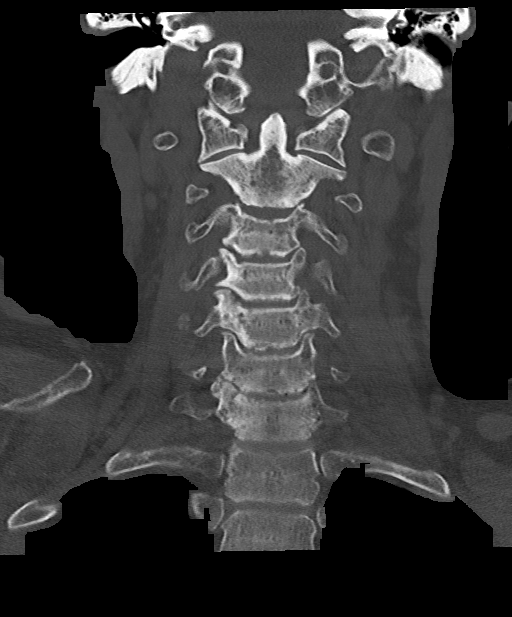
[im 52/89  bone]
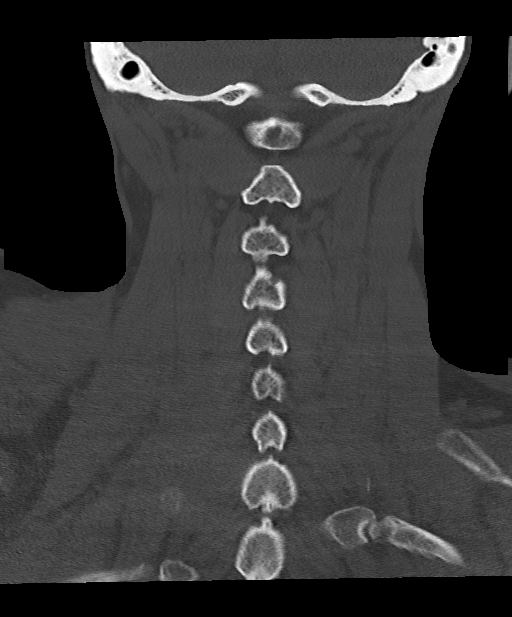

[Series 10: c_spine 2.0 orthogonals · axial · 0.21mm/px · z∈[-211,-185]mm · 2 of 91 slices shown]
[im 16/91  bone]
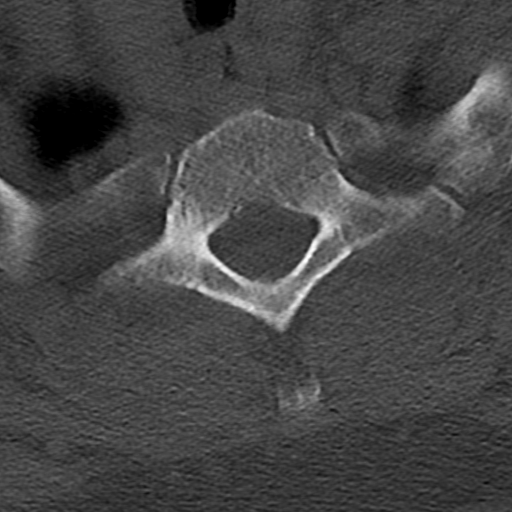
[im 31/91  bone]
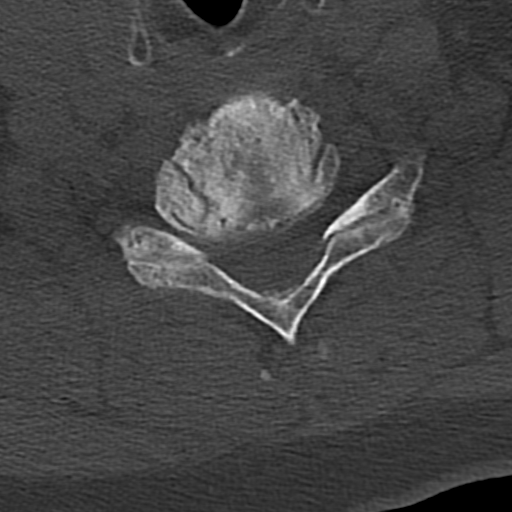

[15 of 33 positions shown; findings below may reference images not displayed]

FINDINGS: CT HEAD FINDINGS

Brain: No evidence of acute infarction, hemorrhage, hydrocephalus,
extra-axial collection or mass lesion/mass effect.

Vascular: No hyperdense vessel or unexpected calcification.

Skull: Normal. Negative for fracture or focal lesion.

Other: Small left parietal scalp hematoma.

CT MAXILLOFACIAL FINDINGS

Osseous: No acute fracture. Old nasal fractures stable from the
study dated 05/09/2021. No bone lesion.

Orbits: Negative. No traumatic or inflammatory finding.

Sinuses: Essentially clear.

Soft tissues: No soft tissue mass. No apparent contusion and no
evidence of a hematoma.

CT CERVICAL SPINE FINDINGS

Alignment: Mild reversal the normal cervical lordosis, apex at C4.
No spondylolisthesis.

Skull base and vertebrae: No acute fracture. No primary bone lesion
or focal pathologic process.

Soft tissues and spinal canal: No prevertebral fluid or swelling. No
visible canal hematoma.

Disc levels: Mild to moderate loss of disc height, C2-C3 C4-C5 with
moderate loss of disc height at C5-C6 and C6-C7. There is endplate
sclerosis, osteophytes and disc bulging at these levels. No
convincing disc herniation.

Upper chest: No acute abnormalities.

Other: None.
IMPRESSION: HEAD CT

1. No acute intracranial abnormalities.
2. No skull fracture.
3. Small left parietal scalp hematoma.

MAXILLOFACIAL CT

1. No acute fracture or acute finding.

CERVICAL CT

1. No fracture or acute finding.

## 2021-08-29 IMAGING — CR DG CHEST 2V
3 series · 3 of 3 positions shown · non-contrast
Comparison: Radiograph 02/29/2020

CLINICAL DATA: Fall.  Trauma.

EXAM:
CHEST - 2 VIEW

[chest lat]
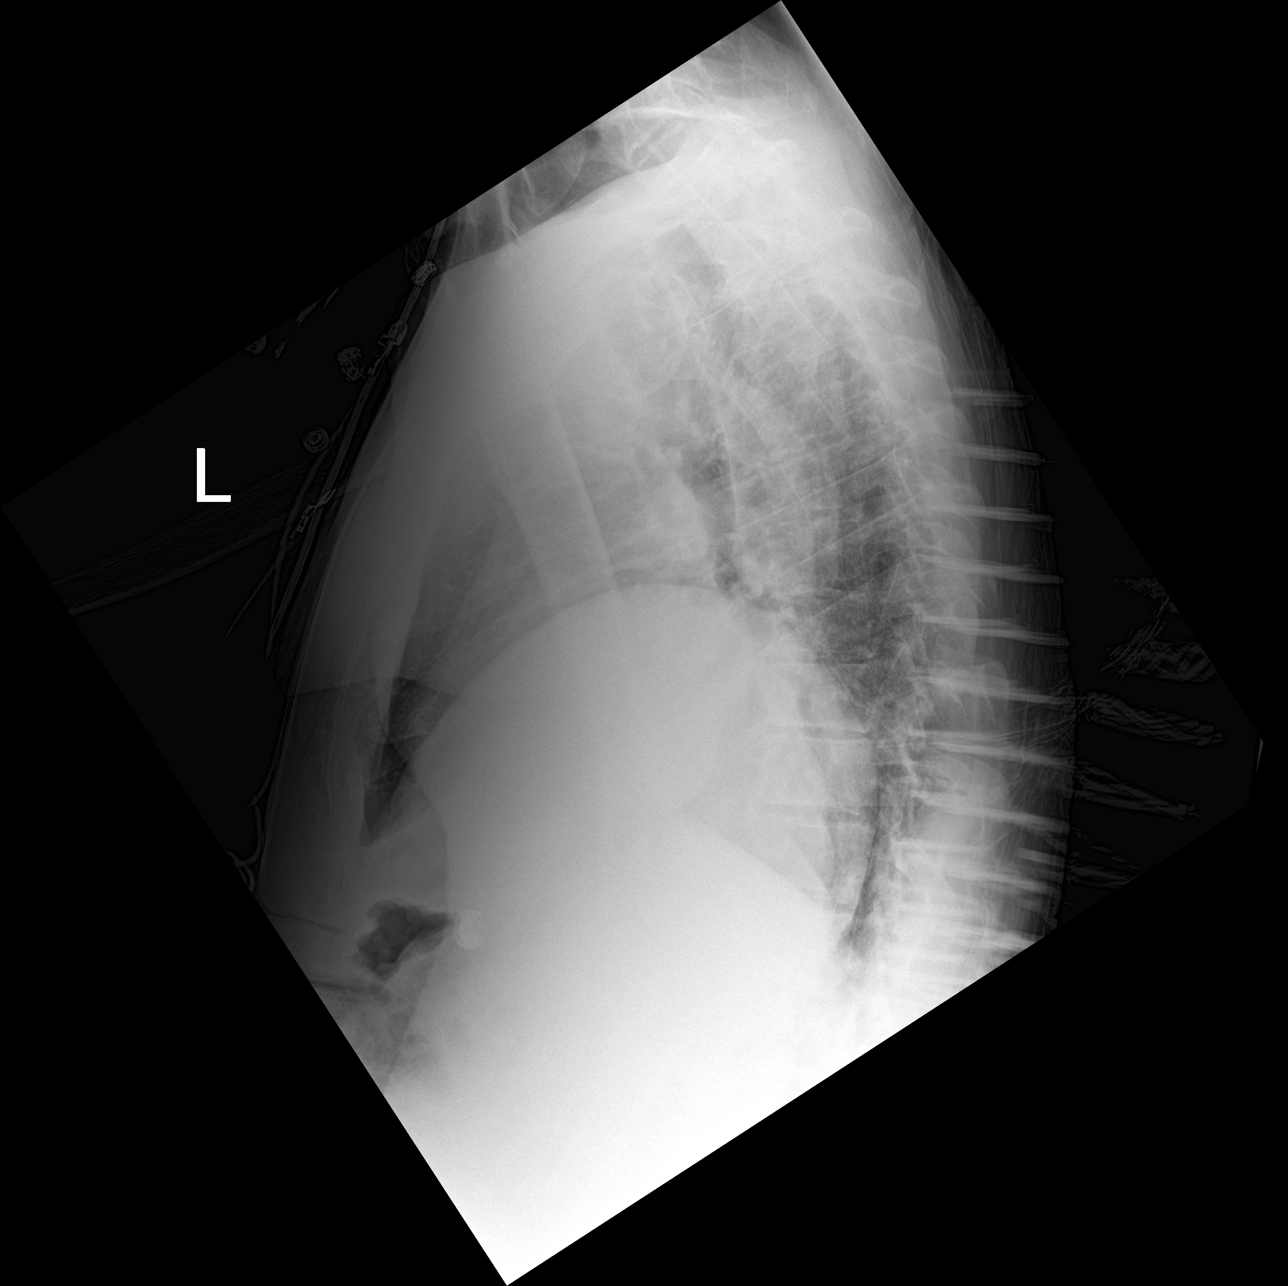

[chest ap (1 of 2)]
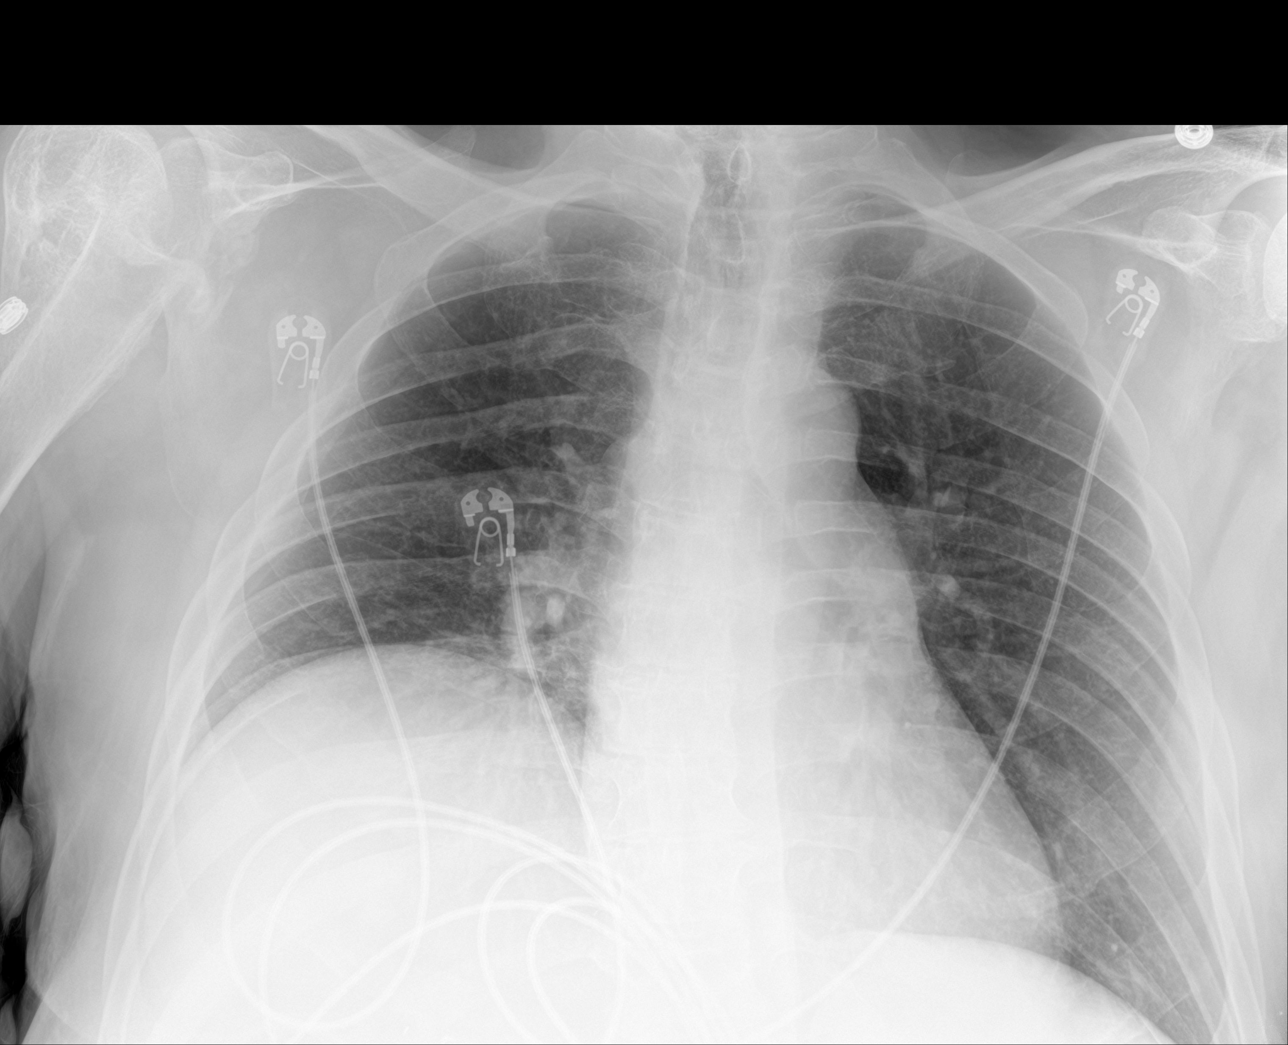

[chest ap (2 of 2)]
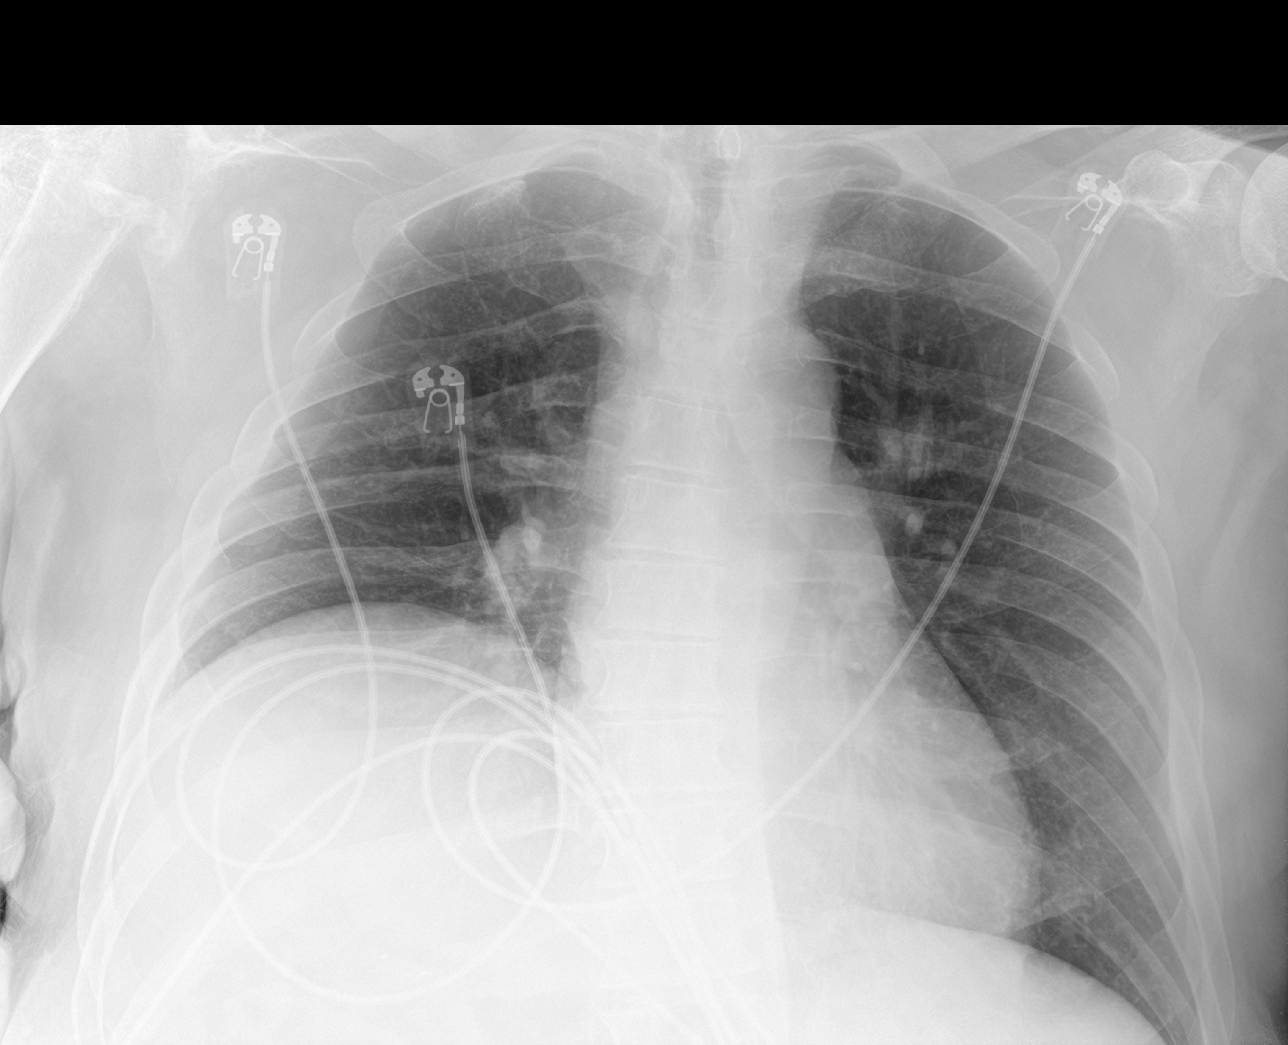

[3 of 3 positions shown; findings below may reference images not displayed]

FINDINGS: Again seen elevation of right hemidiaphragm. Improved basilar
atelectasis. Normal heart size and mediastinal contours. No
pneumothorax or pleural effusion. No focal airspace disease. No
acute osseous abnormalities are seen. Chronic deformity of the right
proximal humerus is assessed on concurrent shoulder exam.
IMPRESSION: No acute findings. Elevated right hemidiaphragm, unchanged from
prior.

## 2021-08-29 IMAGING — CT CT HEAD W/O CM
4 series · 15 of 47 positions shown, 17 images · non-contrast
Comparison: Head, face, and cervical spine CT 8 days ago.

CLINICAL DATA: Head trauma, mod-severe

EXAM:
CT HEAD WITHOUT CONTRAST
TECHNIQUE: Contiguous axial images were obtained from the base of the skull
through the vertex without intravenous contrast.

[Series 3: head bone · axial · 0.44mm/px · z∈[-110,-92]mm · 2 of 86 slices shown]
[im 9/86  bone]
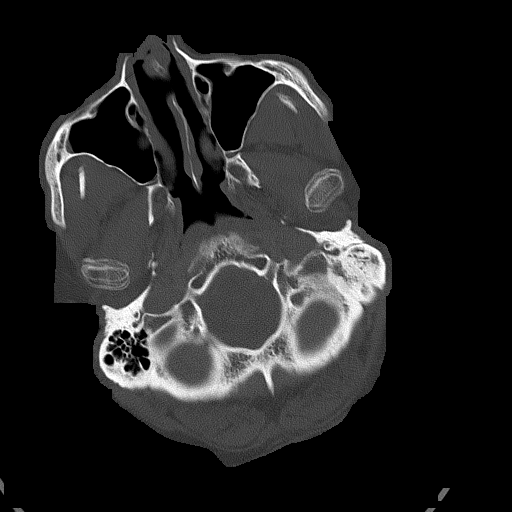
[im 18/86  bone]
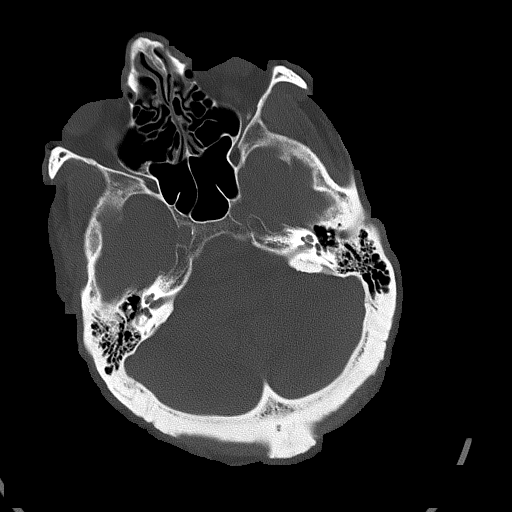

[Series 4: head without · axial · non-contrast · 0.44mm/px · z∈[-106,+18]mm · 7 of 35 slices shown, 9 images]
[im 5/35  brain]
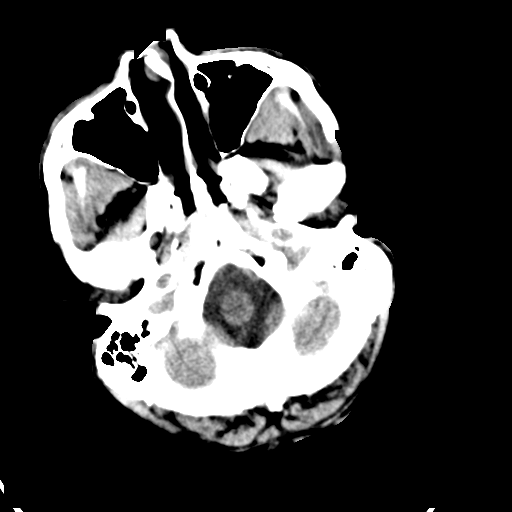
[im 5/35  bone]
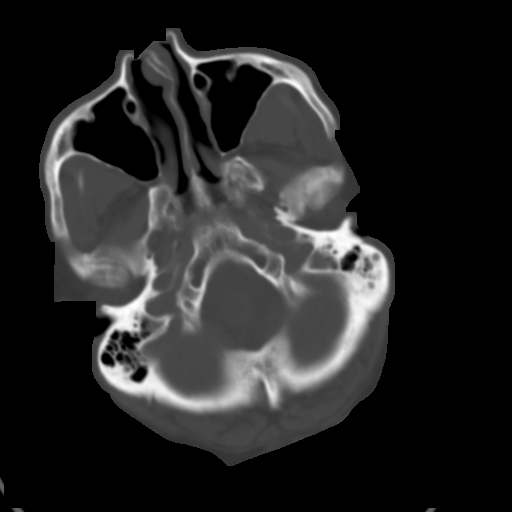
[im 9/35  brain]
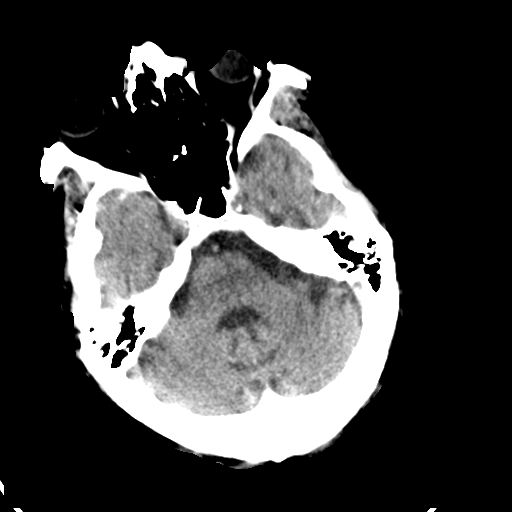
[im 13/35  brain]
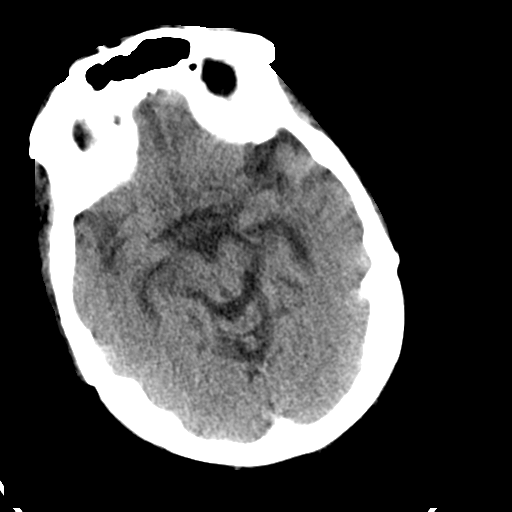
[im 18/35  brain]
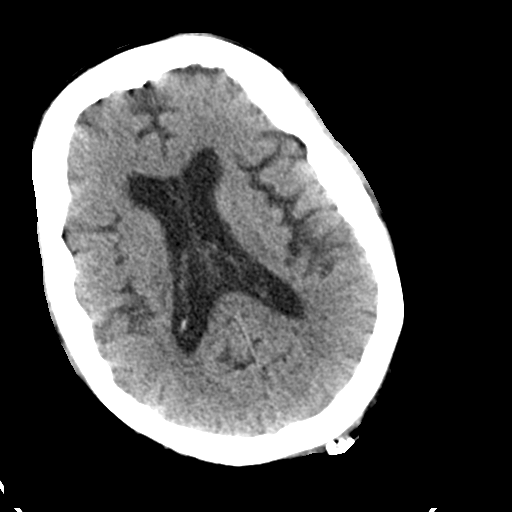
[im 22/35  brain]
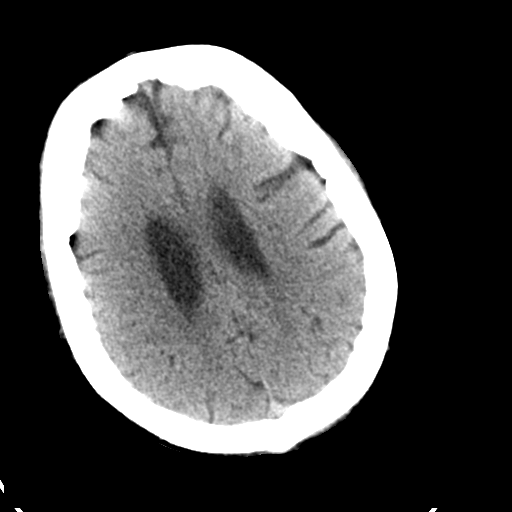
[im 22/35  bone]
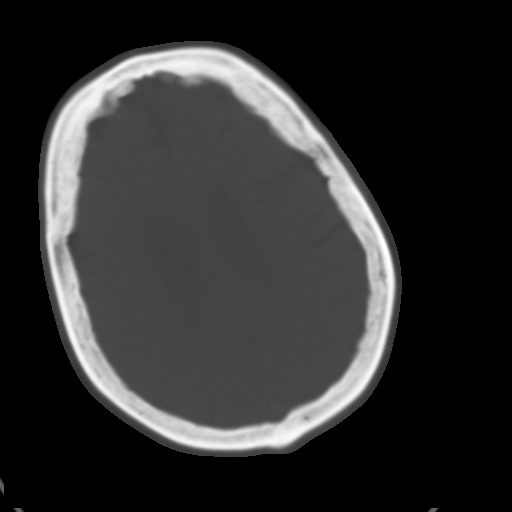
[im 26/35  brain]
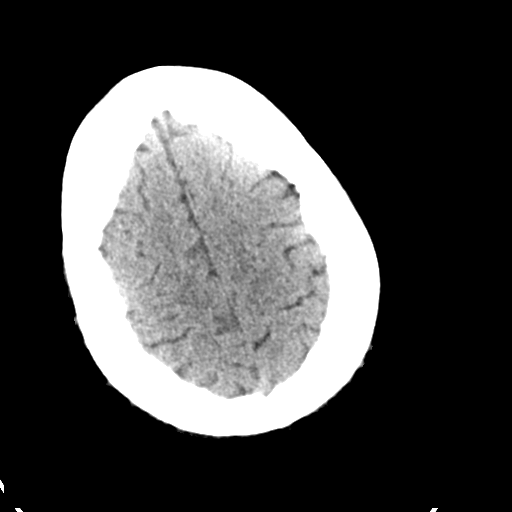
[im 30/35  brain]
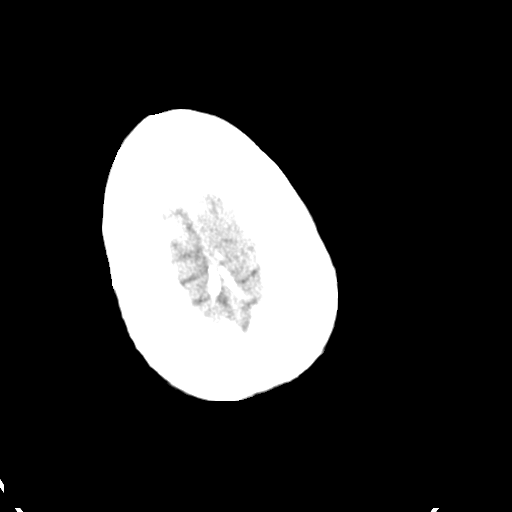

[Series 5: head without cor · coronal · non-contrast · 0.33mm/px · 3 of 77 slices shown]
[im 26/77  brain]
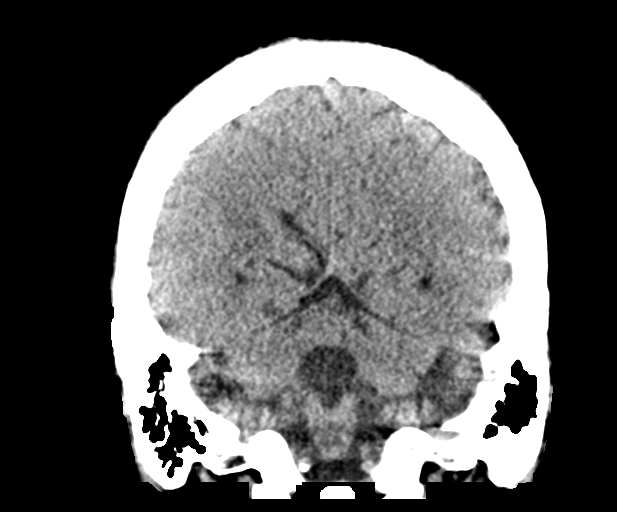
[im 34/77  brain]
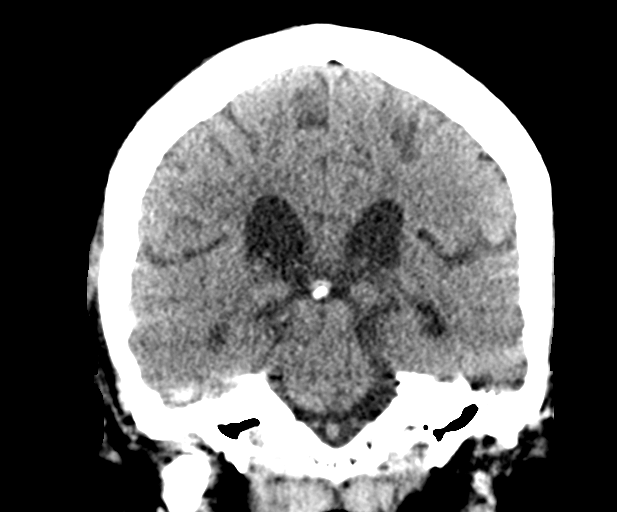
[im 43/77  brain]
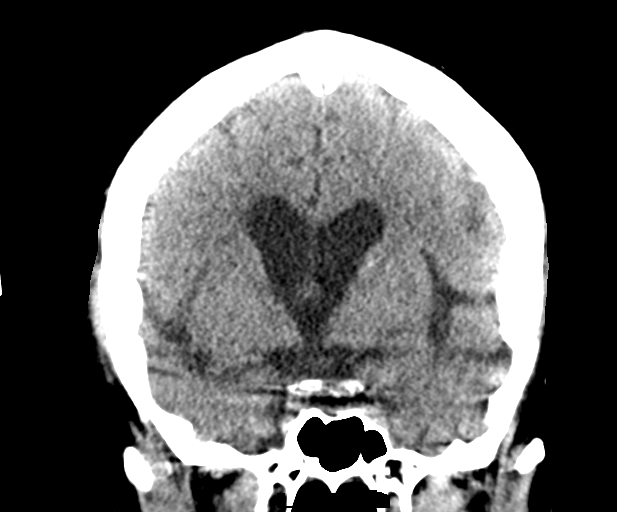

[Series 6: head without sag · sagittal · non-contrast · 0.30mm/px · 3 of 67 slices shown]
[im 23/67  brain]
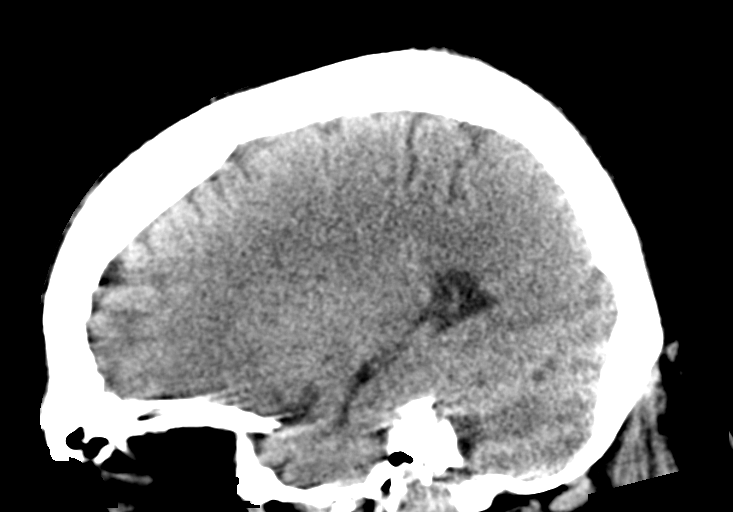
[im 34/67  brain]
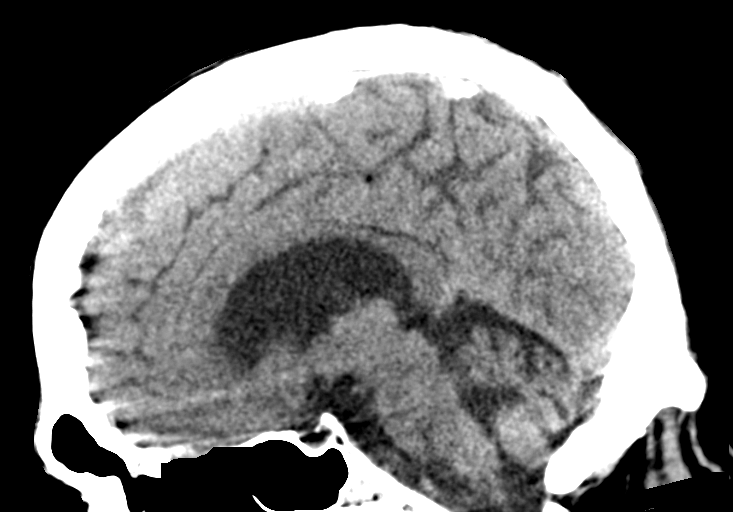
[im 45/67  brain]
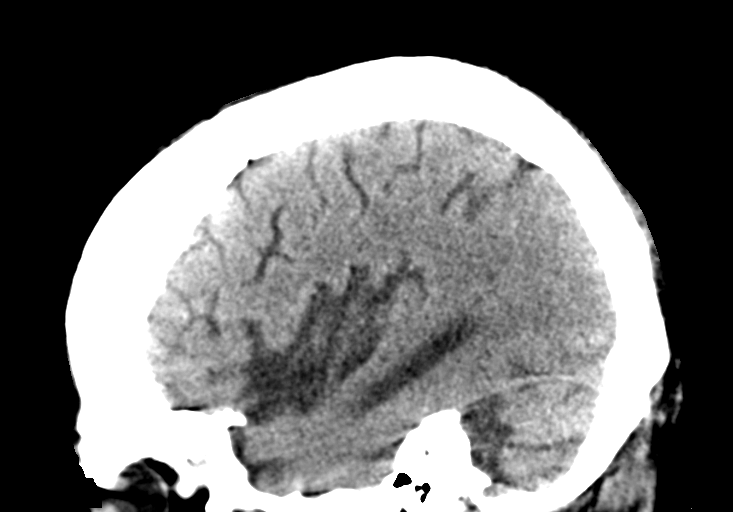

[15 of 47 positions shown; findings below may reference images not displayed]

FINDINGS: Brain: Patient had difficulty tolerating the exam, there is motion
artifact. Generalized atrophy for age, stable. No intracranial
hemorrhage, mass effect, or midline shift. No hydrocephalus. The
basilar cisterns are patent. No evidence of territorial infarct or
acute ischemia. No extra-axial or intracranial fluid collection.

Vascular: No obvious hyperdense vessel, motion obscured MCAs. Skull
base atherosclerosis is seen.

Skull: No fracture or focal lesion.

Sinuses/Orbits: Assessed on concurrent face CT, reported separately.

Other: Posterior occipital scalp hematoma with skin staples in
place. Small right frontal scalp hematoma.
IMPRESSION: 1. Motion limited exam. No acute intracranial abnormality. No skull
fracture.
2. Posterior occipital scalp hematoma with skin staples in place.
Small right frontal scalp hematoma.

## 2021-08-29 IMAGING — CR DG SHOULDER 2+V*R*
3 series · 3 of 3 positions shown · non-contrast
Comparison: Humerus radiograph 01/08/2021

CLINICAL DATA: Fall, trauma.

EXAM:
RIGHT SHOULDER - 2+ VIEW

[shoulder grashey]
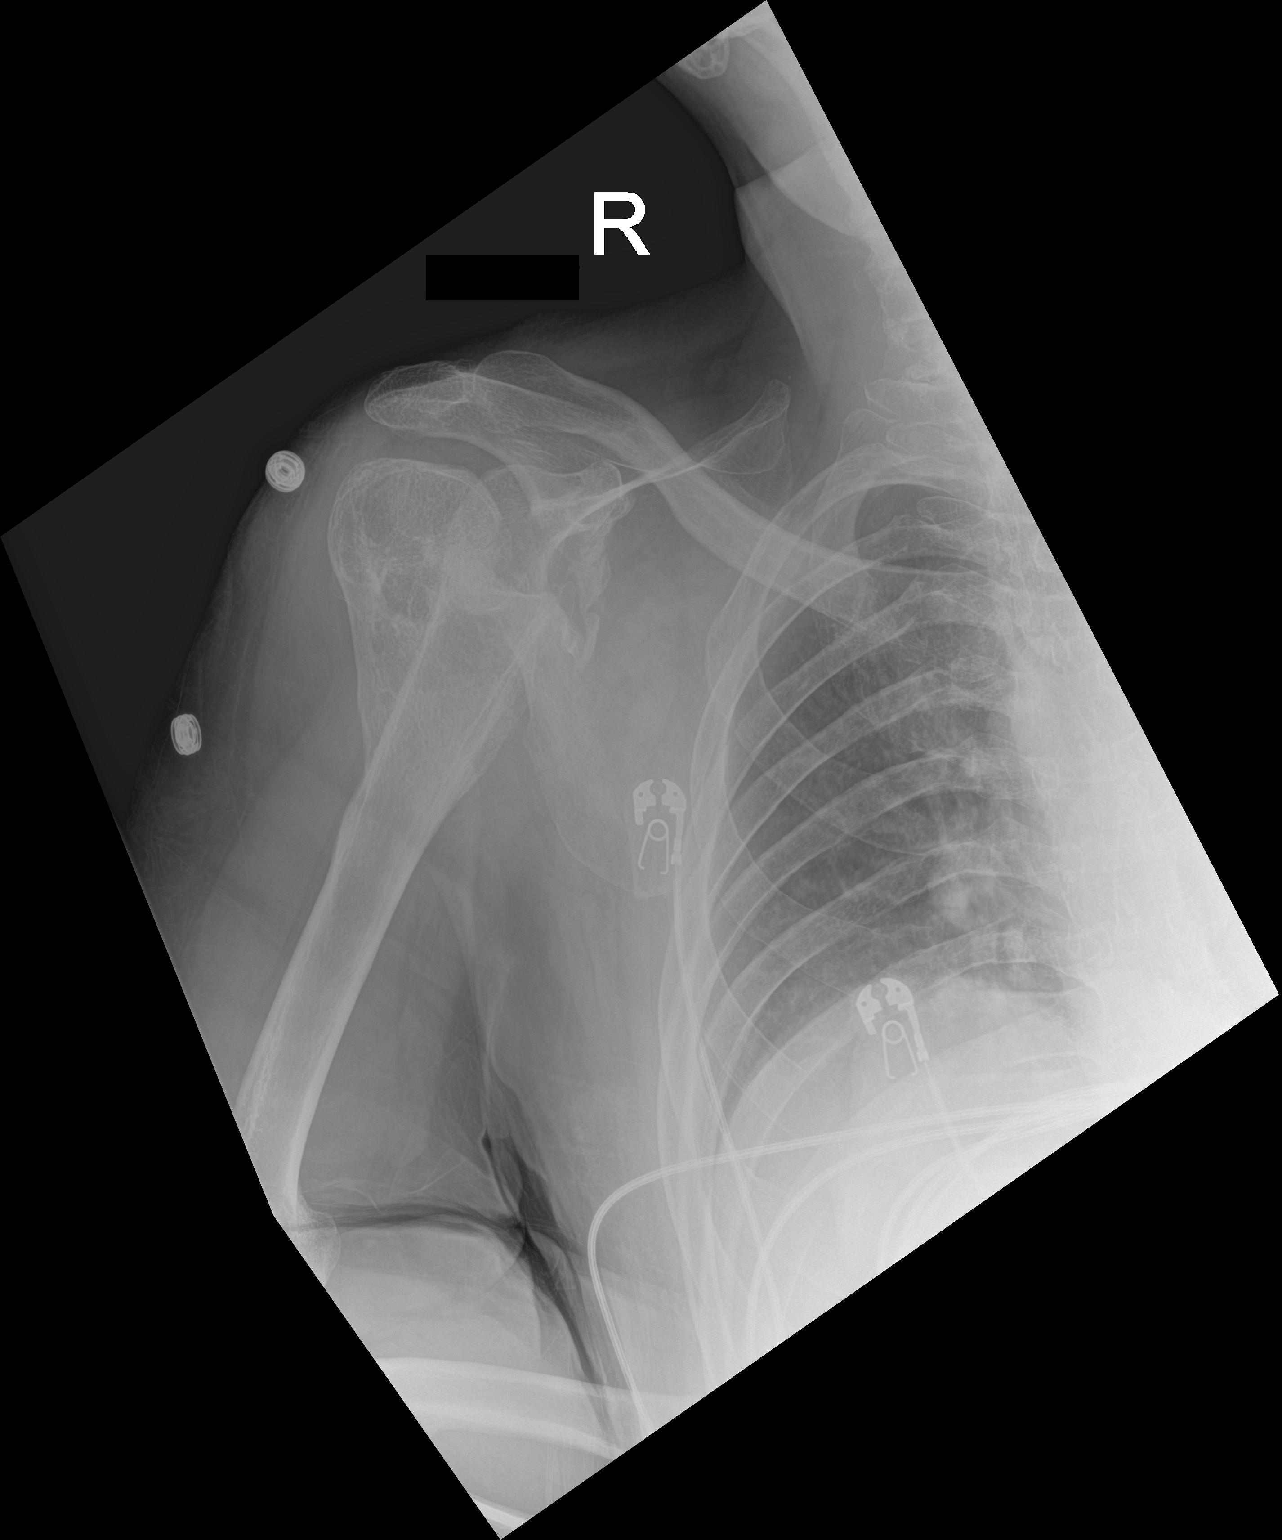

[shoulder y view]
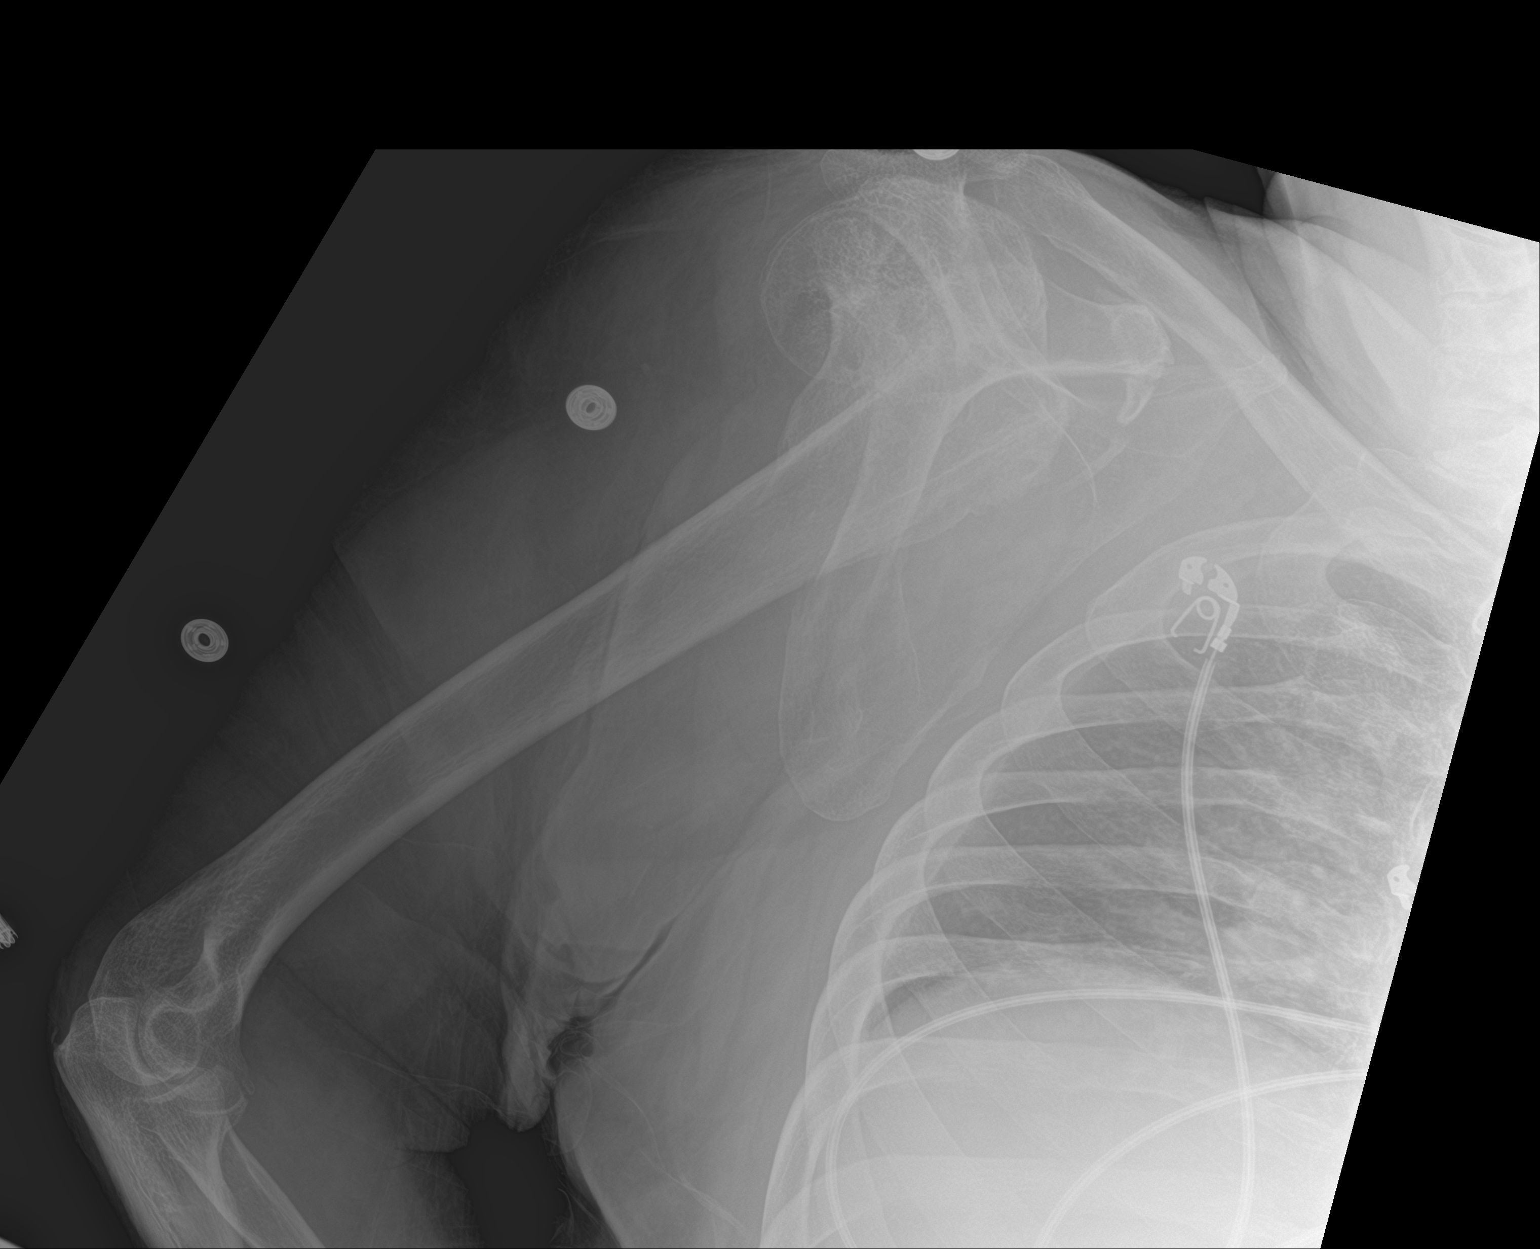

[shoulder ap neutral]
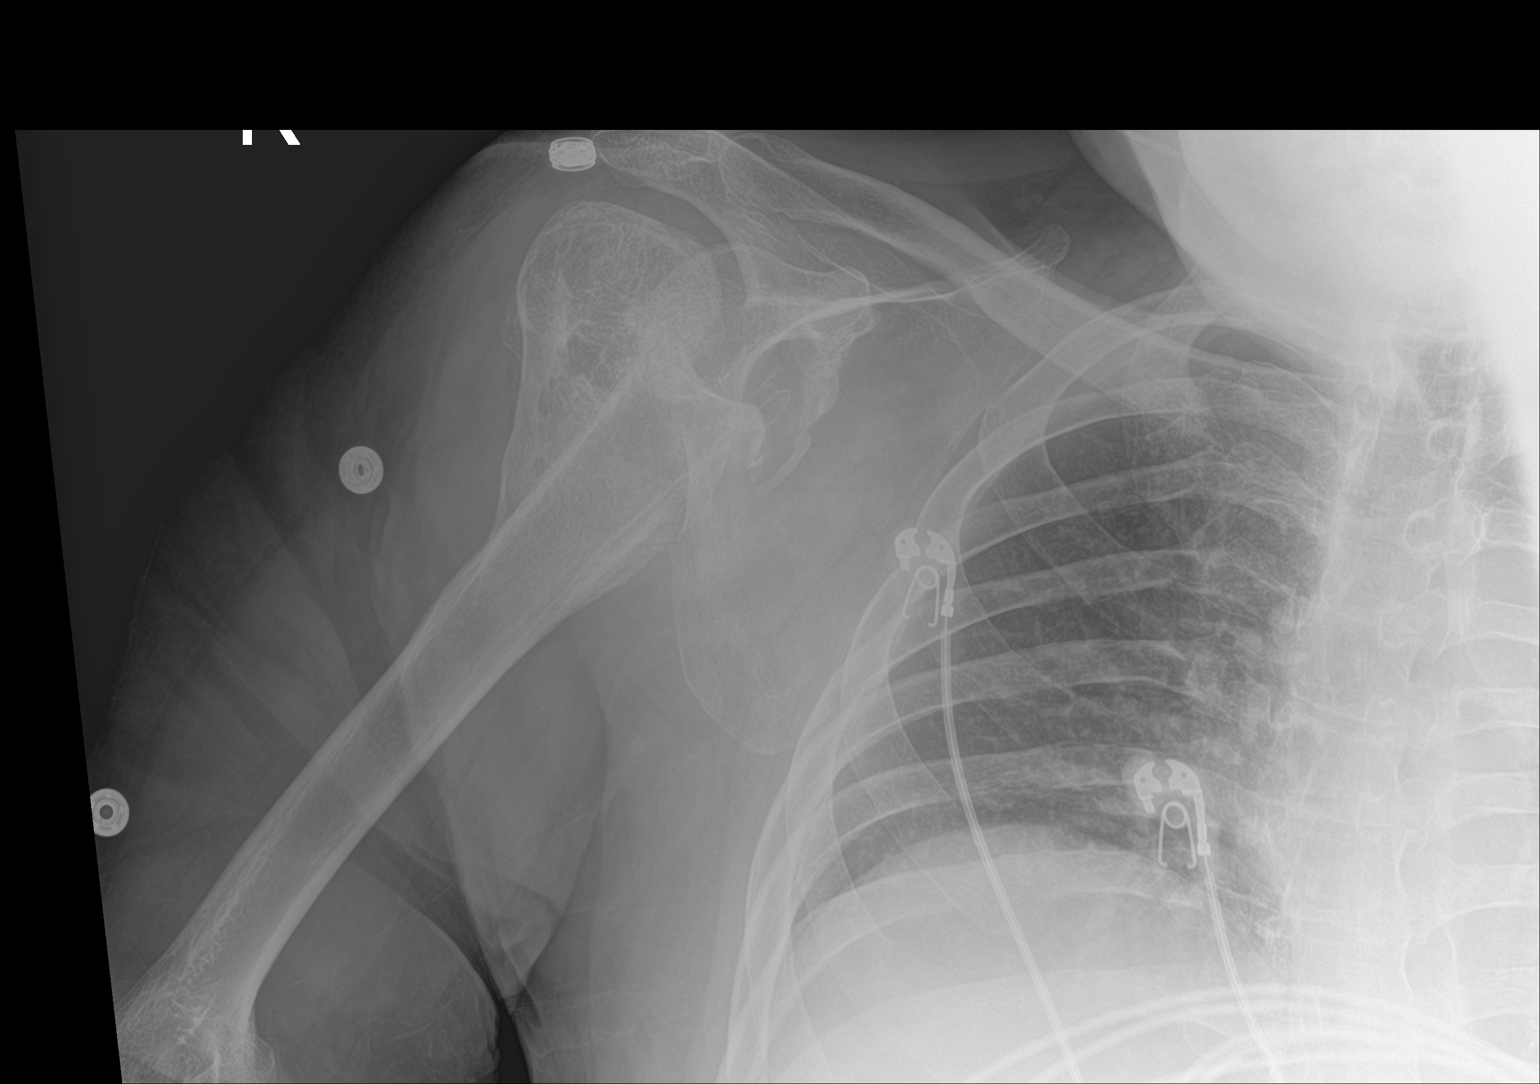

[3 of 3 positions shown; findings below may reference images not displayed]

FINDINGS: Remote healed fracture deformity of the proximal humerus, unchanged
in alignment. No acute fracture. Normal acromioclavicular joint.
Glenohumeral alignment is stable from prior exam.
IMPRESSION: Remote healed fracture deformity of the proximal humerus. No acute
fracture.

## 2021-08-29 IMAGING — CT CT MAXILLOFACIAL W/O CM
3 series · 16 of 47 positions shown, 19 images · non-contrast
Comparison: Head, face, and cervical spine CT 8 days ago.

CLINICAL DATA: Trauma.

EXAM:
CT MAXILLOFACIAL WITHOUT CONTRAST
TECHNIQUE: Multidetector CT imaging of the maxillofacial structures was
performed. Multiplanar CT image reconstructions were also generated.

[Series 3: facialbone 2.0 st · axial · 0.43mm/px · z∈[-198,-62]mm · 10 of 80 slices shown, 13 images]
[im 6/80  brain]
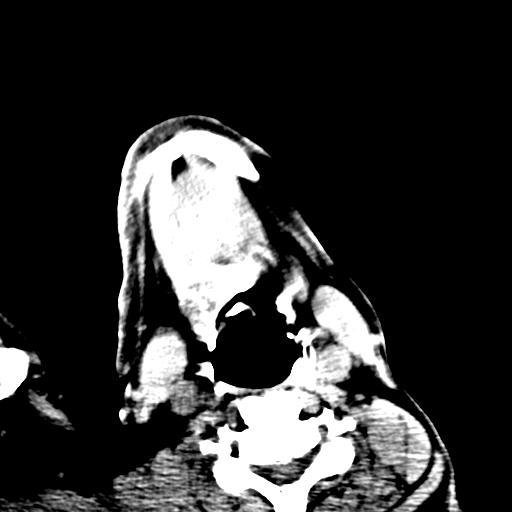
[im 6/80  bone]
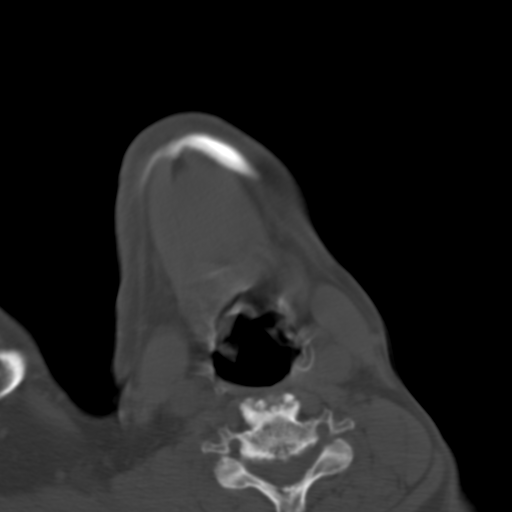
[im 14/80  bone]
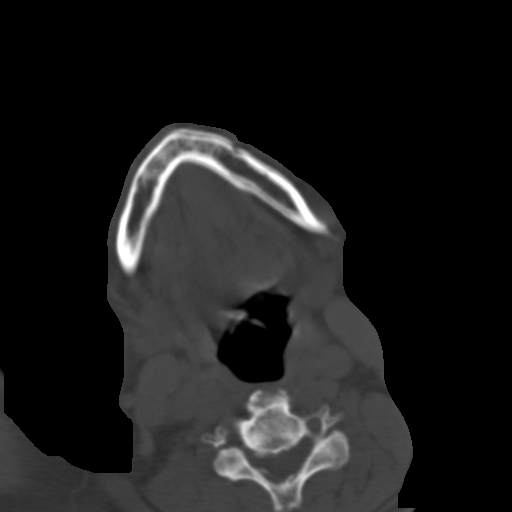
[im 22/80  bone]
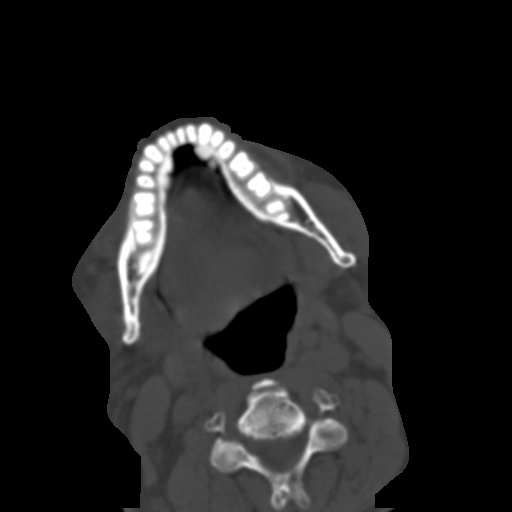
[im 28/80  bone]
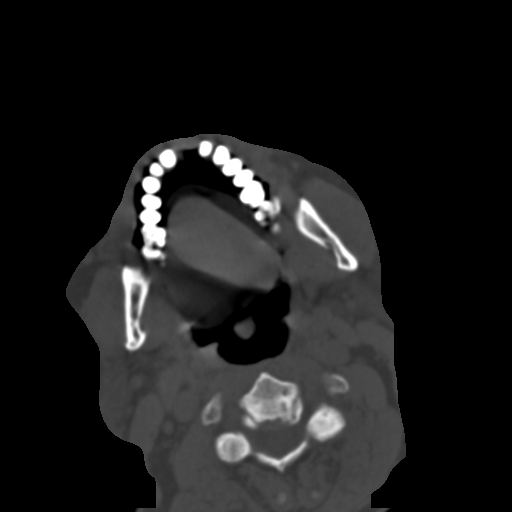
[im 36/80  brain]
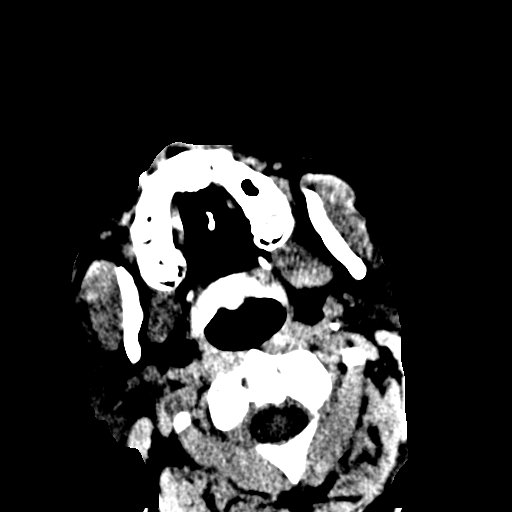
[im 36/80  bone]
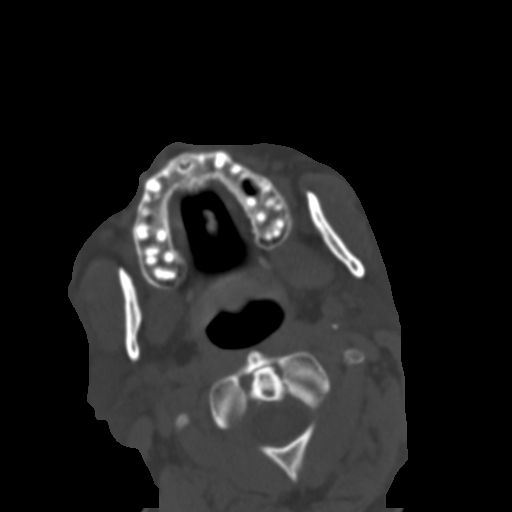
[im 44/80  bone]
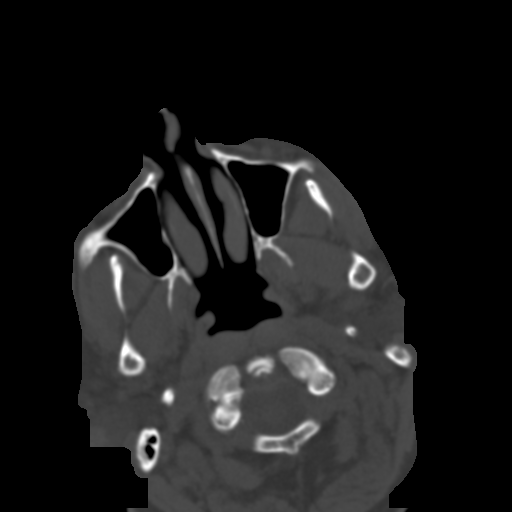
[im 52/80  bone]
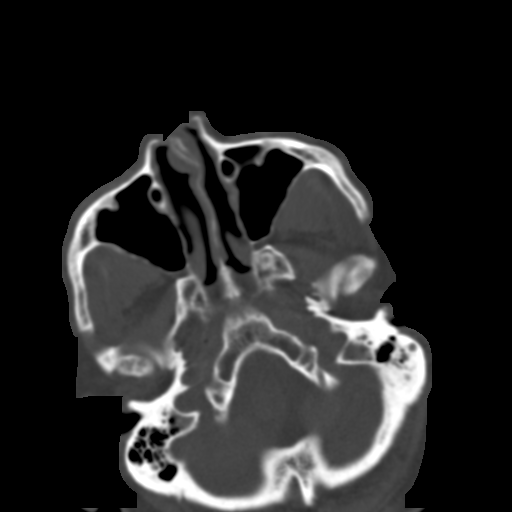
[im 60/80  bone]
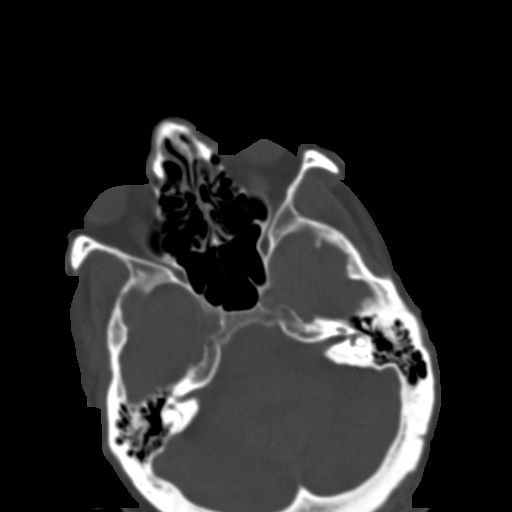
[im 66/80  brain]
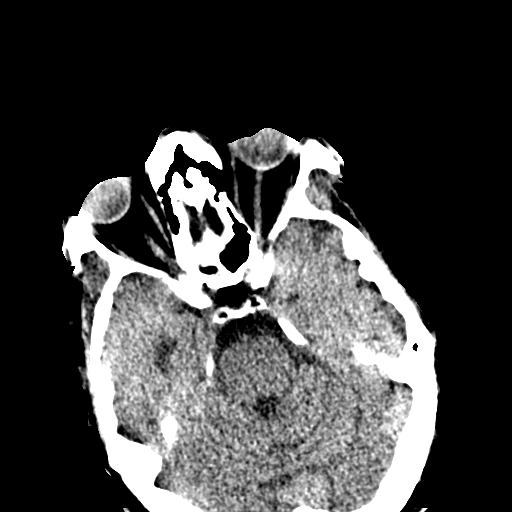
[im 66/80  bone]
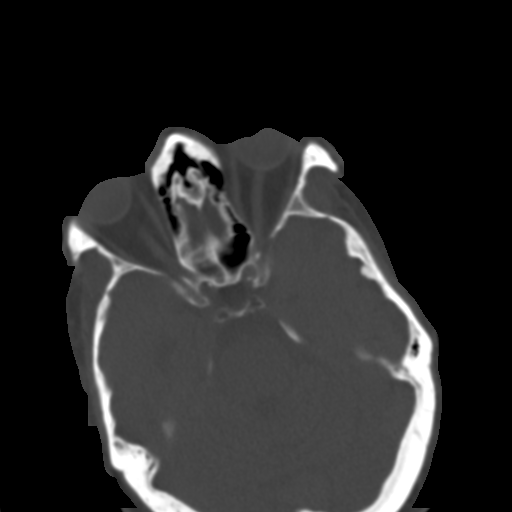
[im 74/80  bone]
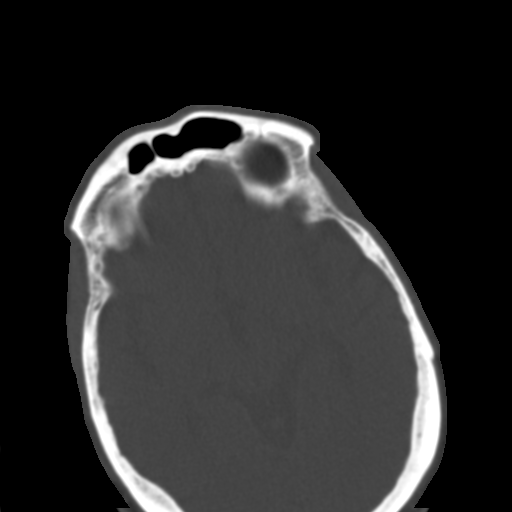

[Series 8: facialbone 2.0 cor st · coronal · 0.33mm/px · 3 of 85 slices shown]
[im 38/85  bone]
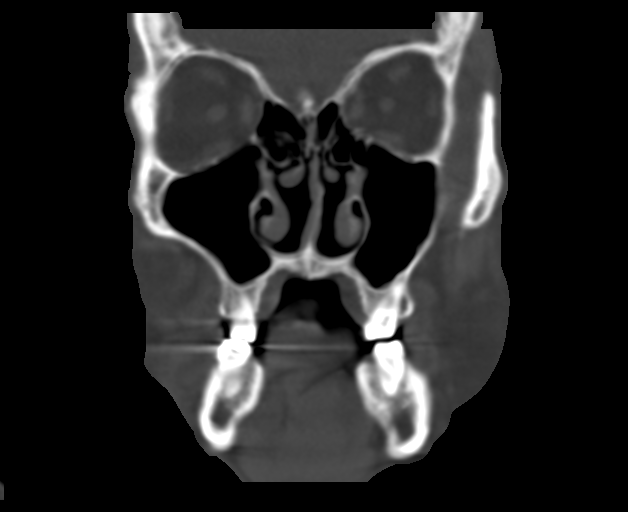
[im 47/85  bone]
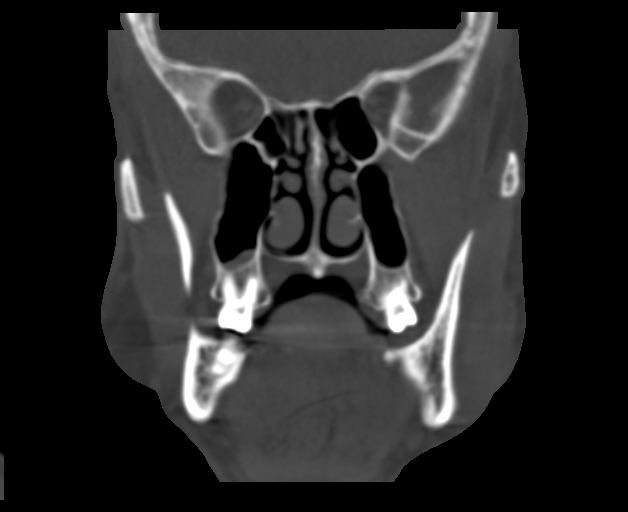
[im 57/85  bone]
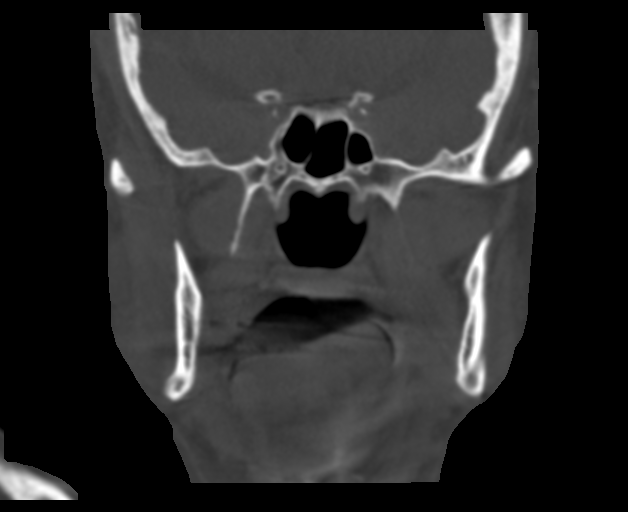

[Series 10: facialbone 2.0 sag st · sagittal · 0.30mm/px · 3 of 93 slices shown]
[im 31/93  bone]
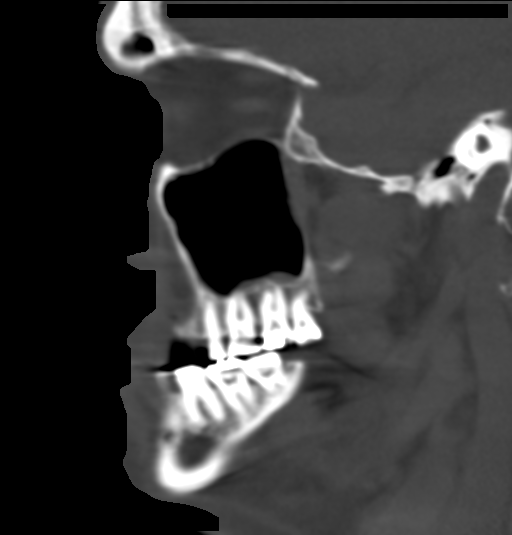
[im 47/93  bone]
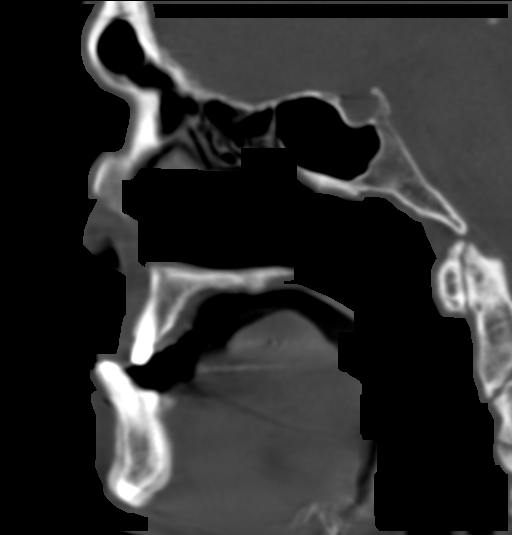
[im 62/93  bone]
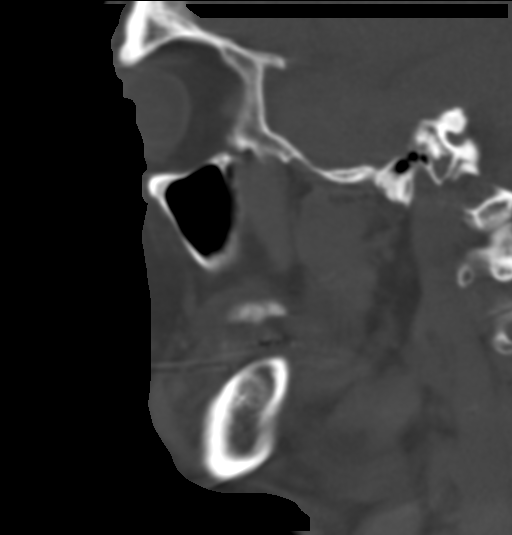

[16 of 47 positions shown; findings below may reference images not displayed]

FINDINGS: Osseous: Chronic nasal bone deformity unchanged in appearance. No
acute nasal bone fracture. No acute fracture of the zygomatic arches
or mandibles. Symmetric anterior positioning of the mandibular
condyles from the temporomandibular fossa, also seen on prior.

Orbits: No acute orbital fracture or globe injury. Stable appearance
of prior left medial orbital wall fracture.

Sinuses: No sinus fracture or fluid level. Trace mucosal thickening
of the right maxillary sinus. Occasional opacification of left
mastoid air cells, stable.

Soft tissues: Nonacute.

Limited intracranial: Assessed on concurrent head CT, reported
separately.
IMPRESSION: No acute facial bone fracture. Chronic nasal bone and left medial
orbital wall fractures.

## 2021-08-29 IMAGING — CT CT CERVICAL SPINE W/O CM
3 of 4 series · 13 of 33 positions shown, 16 images · non-contrast
Comparison: Head, face, and cervical spine CT 8 days ago.

CLINICAL DATA: Trauma.

EXAM:
CT CERVICAL SPINE WITHOUT CONTRAST
TECHNIQUE: Multidetector CT imaging of the cervical spine was performed without
intravenous contrast. Multiplanar CT image reconstructions were also
generated.

[Series 4: c_spine 2.0 orthogonals · axial · 0.21mm/px · z∈[-267,-154]mm · 5 of 99 slices shown, 7 images]
[im 17/99  soft-tissue]
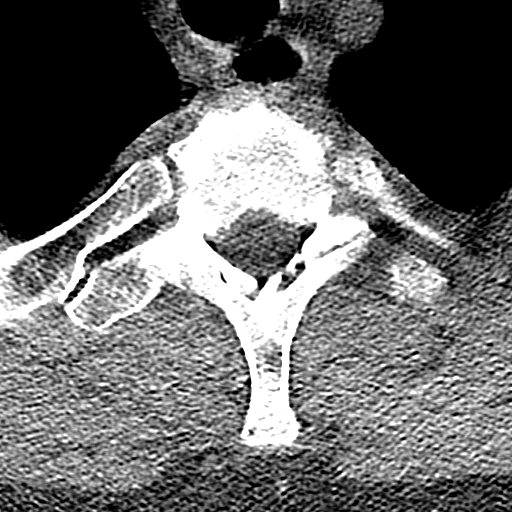
[im 17/99  bone]
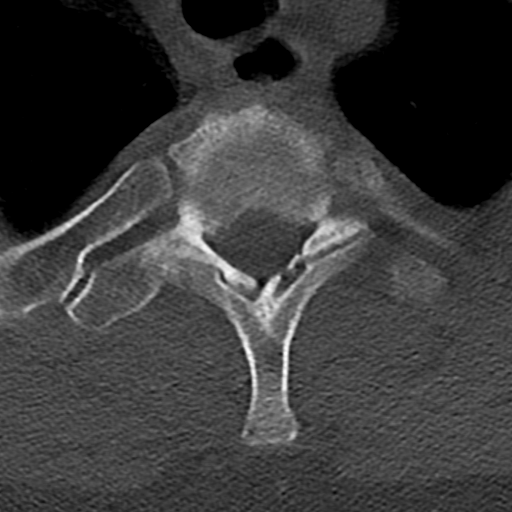
[im 33/99  bone]
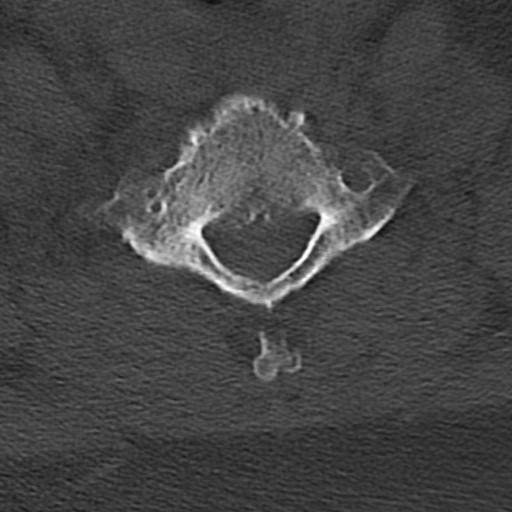
[im 50/99  bone]
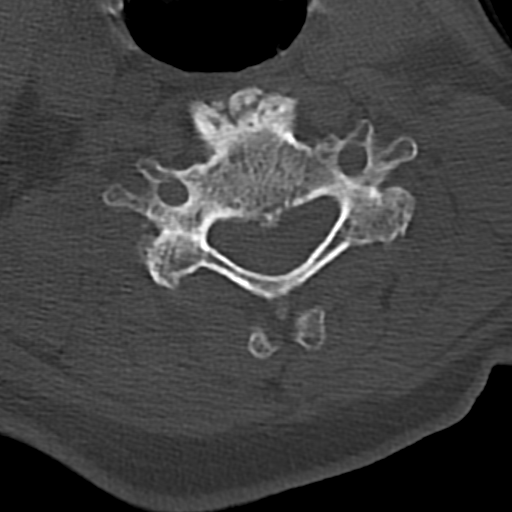
[im 66/99  bone]
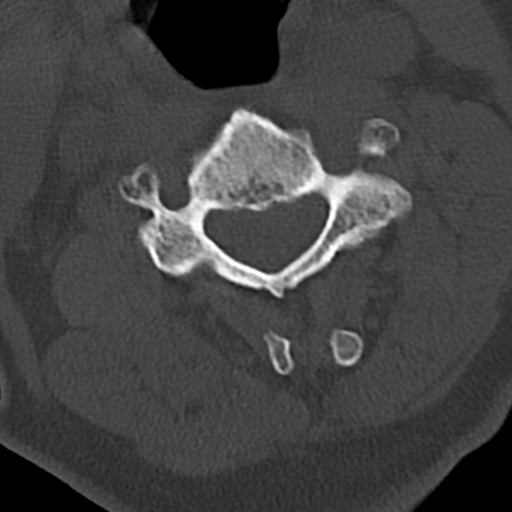
[im 82/99  soft-tissue]
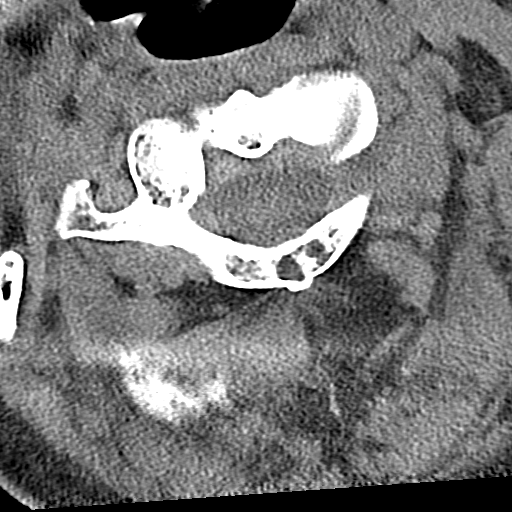
[im 82/99  bone]
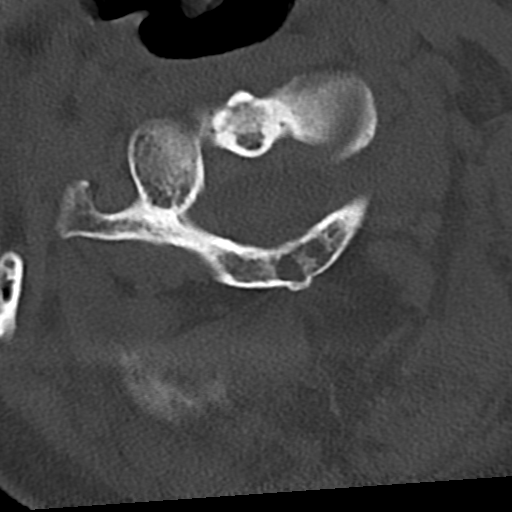

[Series 9: c_spine 2.0 sag bone · sagittal · 0.23mm/px · 5 of 82 slices shown, 6 images]
[im 28/82  bone]
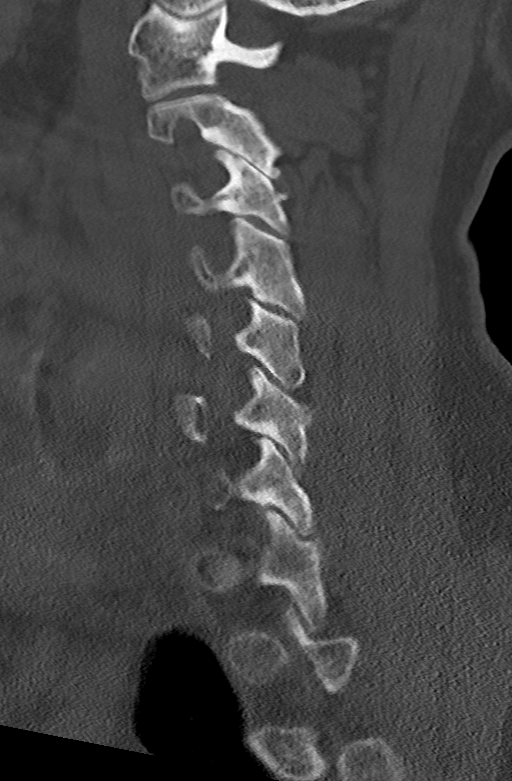
[im 34/82  bone]
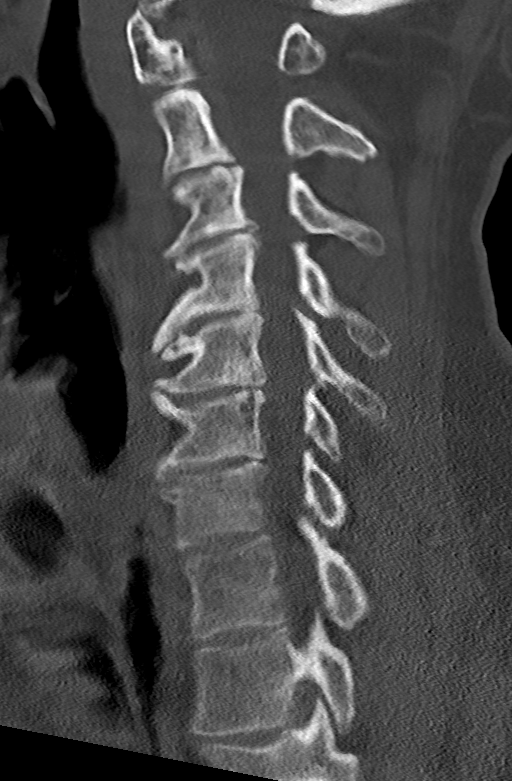
[im 41/82  soft-tissue]
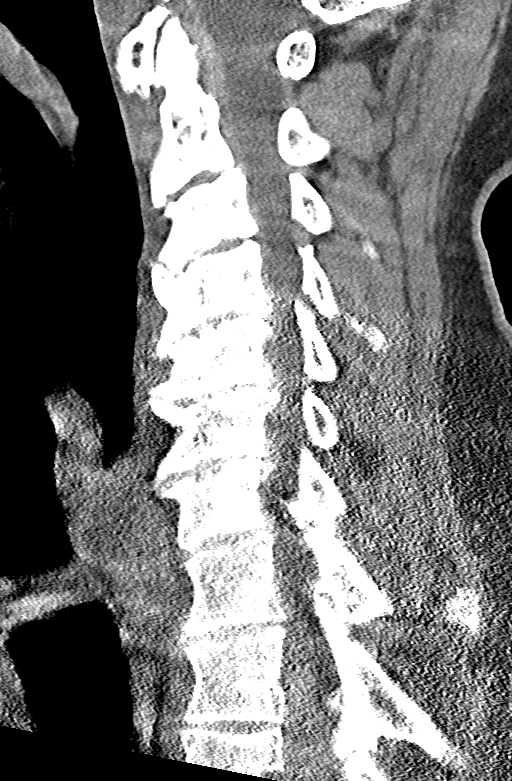
[im 41/82  bone]
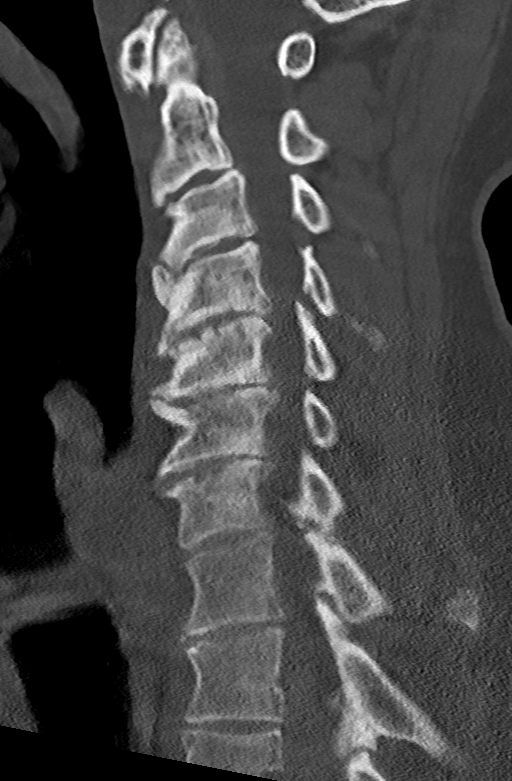
[im 48/82  bone]
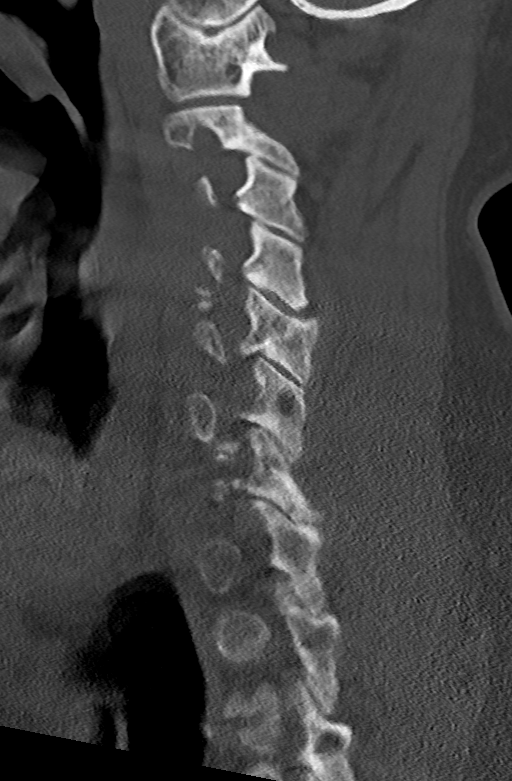
[im 55/82  bone]
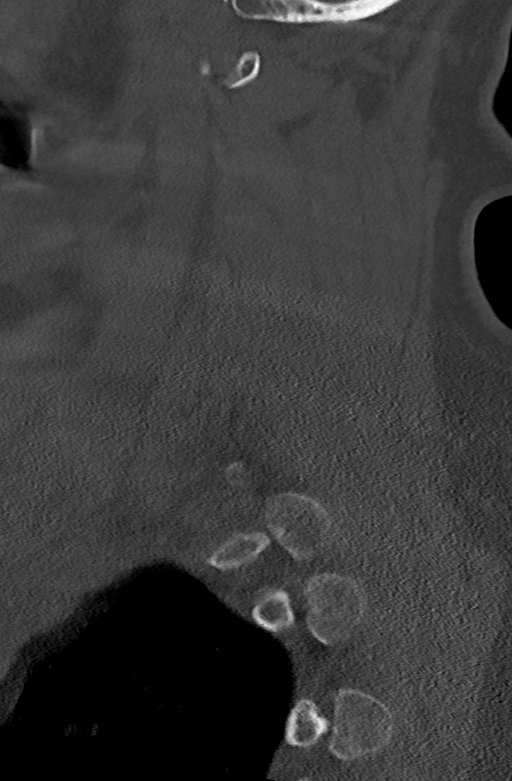

[Series 10: c_spine 2.0 cor bone · coronal · 0.29mm/px · 3 of 61 slices shown]
[im 13/61  bone]
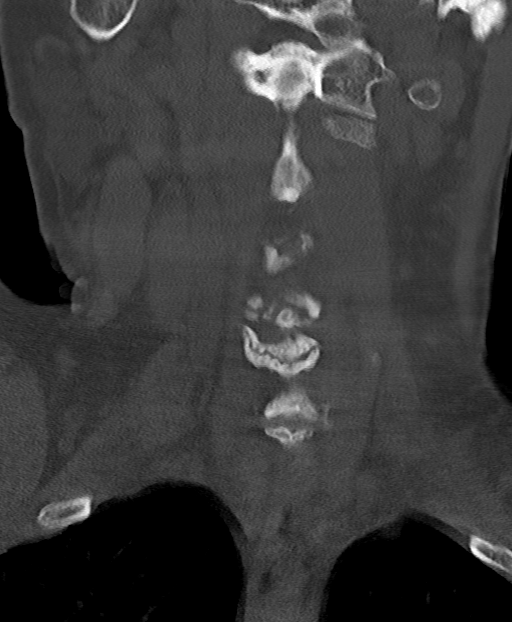
[im 25/61  bone]
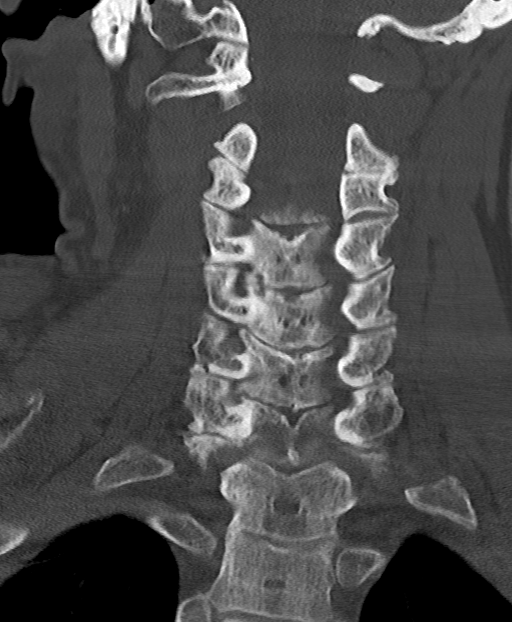
[im 37/61  bone]
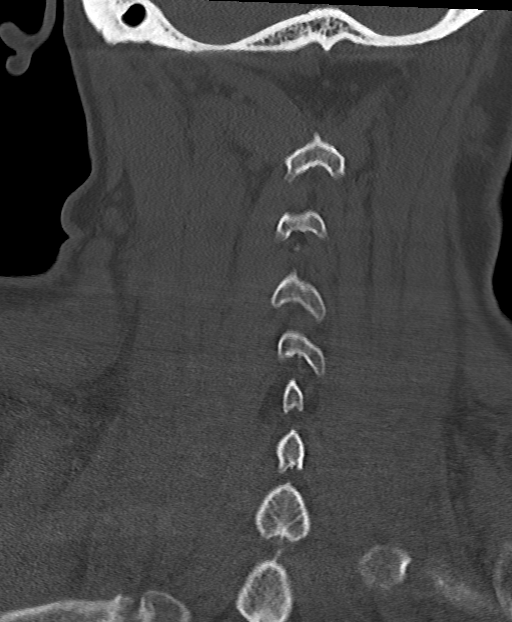

[13 of 33 positions shown; findings below may reference images not displayed]

FINDINGS: Alignment: Stable straightening and mild broad-based reversal of
normal lordosis. Unchanged trace anterolisthesis of C2 on C3. No
traumatic subluxation.

Skull base and vertebrae: No acute fracture. Vertebral body heights
are maintained. The dens and skull base are intact.

Soft tissues and spinal canal: No prevertebral fluid or swelling. No
visible canal hematoma.

Disc levels: Diffuse degenerative disc disease with multilevel facet
hypertrophy, unchanged.

Upper chest: No acute or unexpected findings.

Other: None.
IMPRESSION: 1. No acute fracture or subluxation of the cervical spine.
2. Stable multilevel degenerative disc disease and facet
hypertrophy.

## 2021-08-29 IMAGING — CR DG PELVIS 1-2V
2 series · 2 of 2 positions shown · non-contrast
Comparison: None.

CLINICAL DATA: Fall, trauma.

EXAM:
PELVIS - 1-2 VIEW

[pelvis ap (1 of 2)]
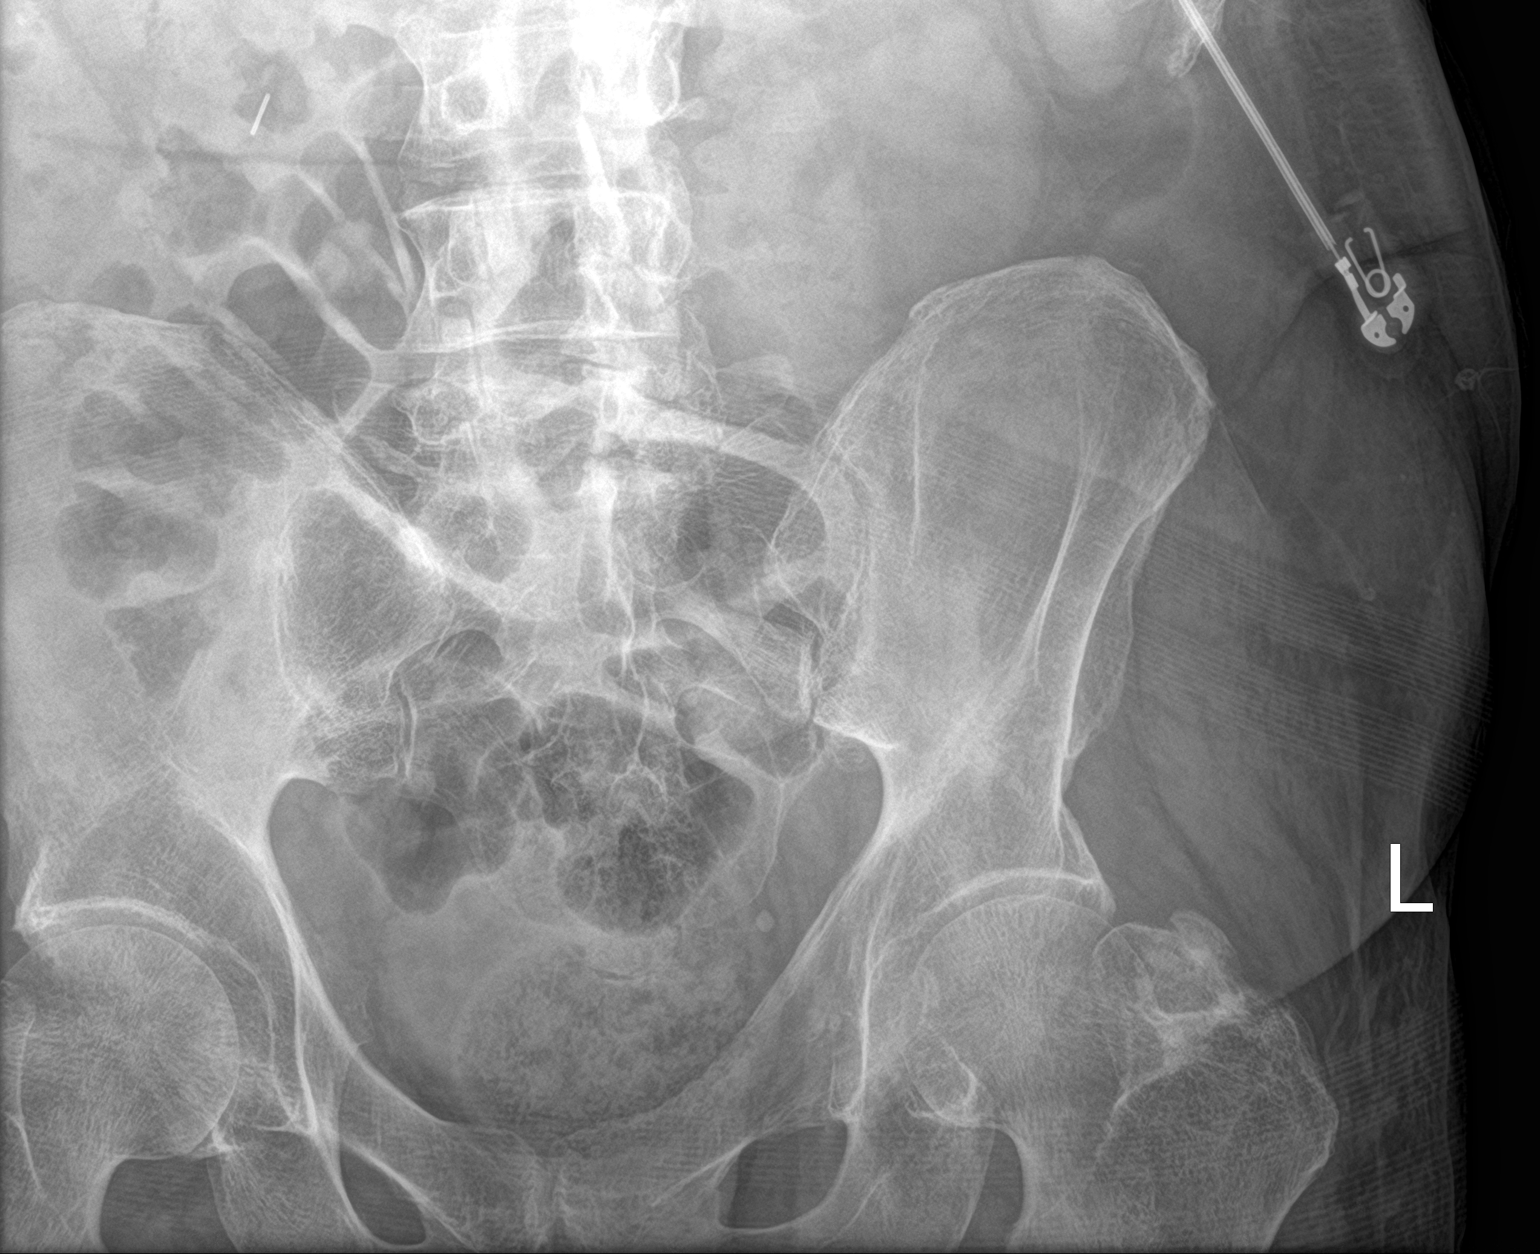

[pelvis ap (2 of 2)]
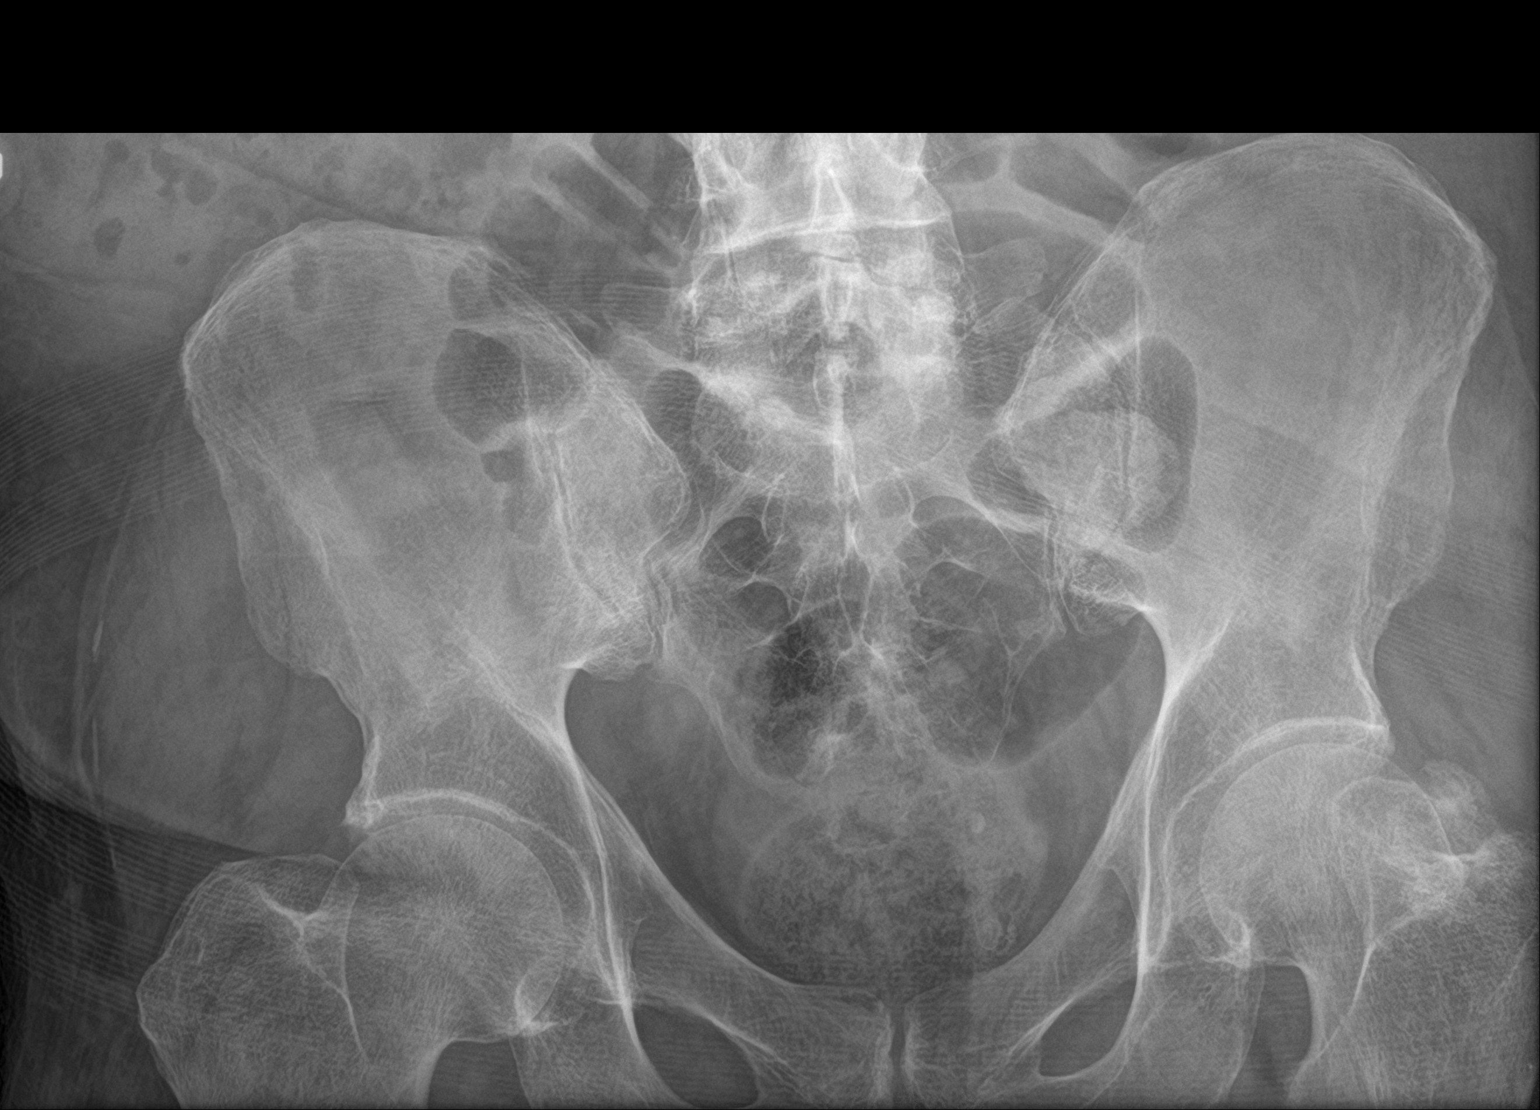

[2 of 2 positions shown; findings below may reference images not displayed]

FINDINGS: Trochanteric femurs are not entirely included in the field of view,
additionally the inferior aspect of the pubic rami are obscured. The
cortical margins of the included pelvis are intact. No fracture.
Pubic symphysis and sacroiliac joints are congruent. Both femoral
heads are well-seated in the respective acetabula.
IMPRESSION: No fracture of the included pelvis. Inferior aspect of the pubic
rami is well as trochanteric femurs are not entirely included in the
field of view.

## 2021-08-29 IMAGING — CR DG HAND COMPLETE 3+V*L*
3 series · 3 of 3 positions shown · non-contrast
Comparison: None.

CLINICAL DATA: Trauma.  Scratches to left hand

EXAM:
LEFT HAND - COMPLETE 3+ VIEW

[hand pa]
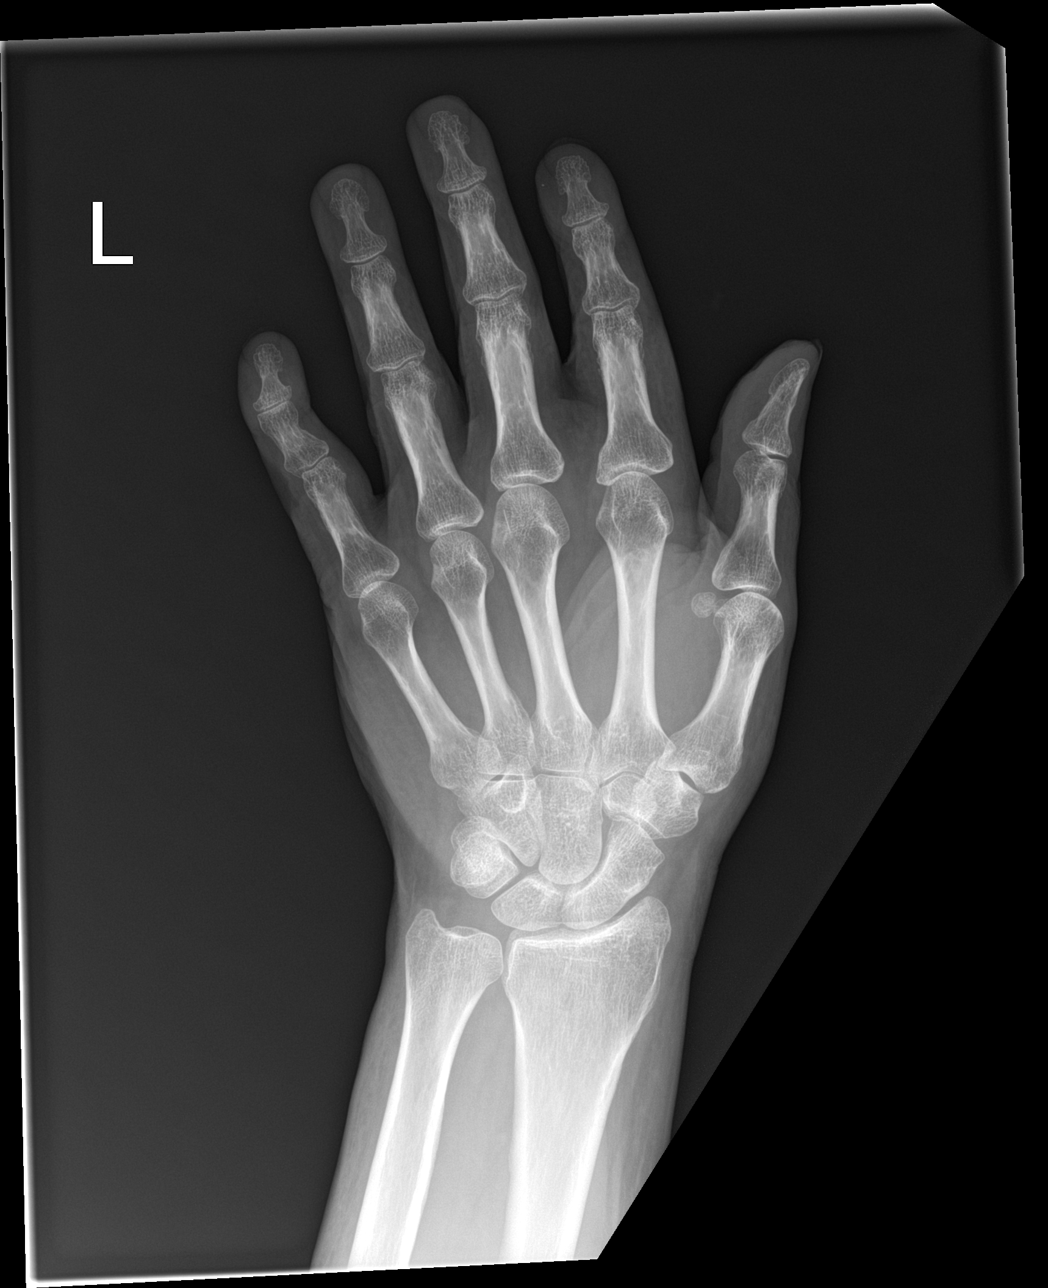

[hand obl]
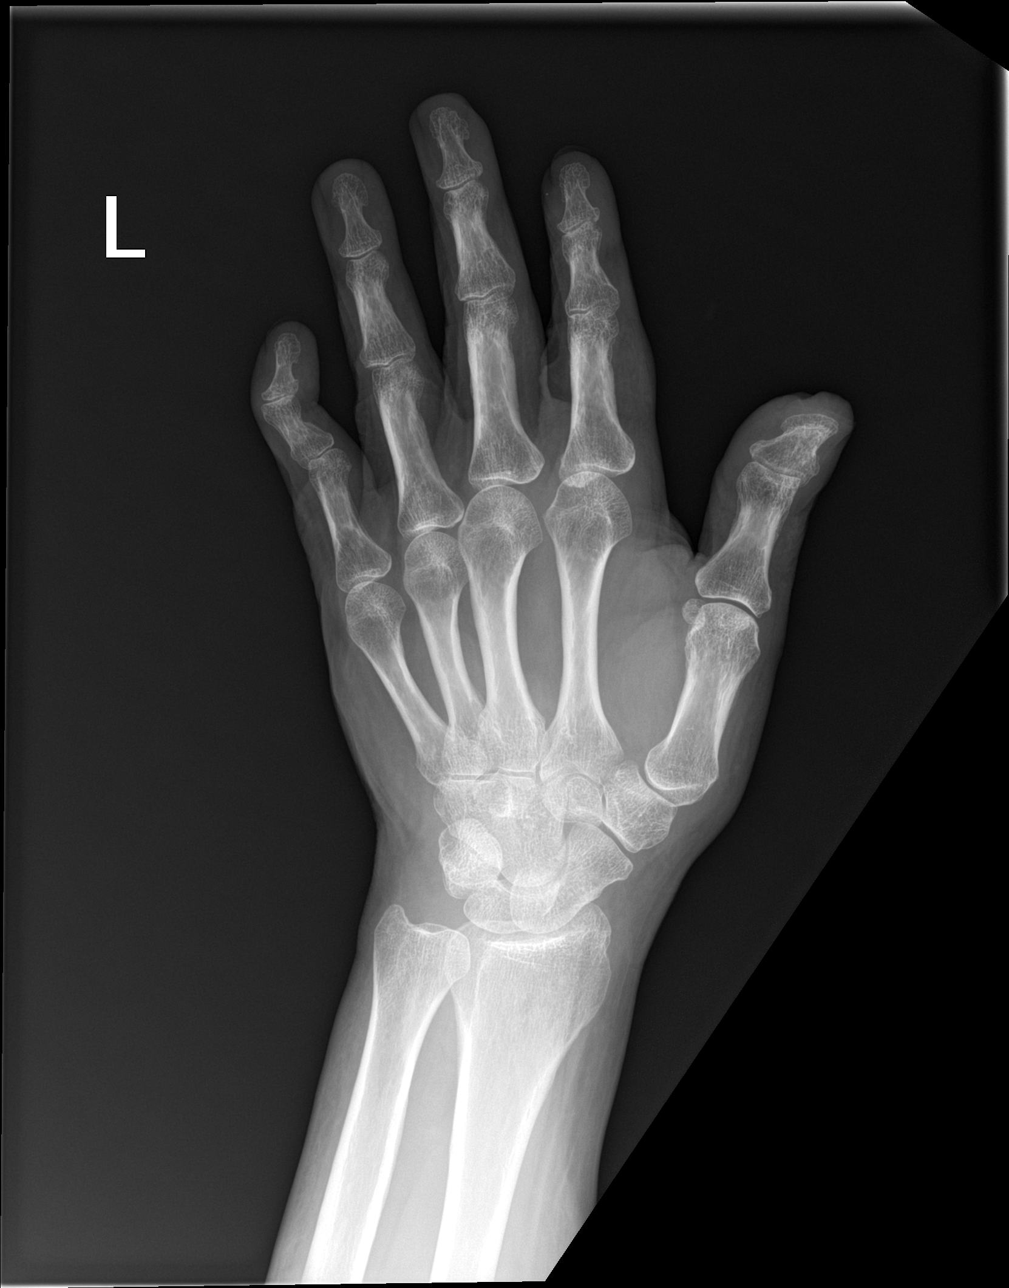

[hand lat]
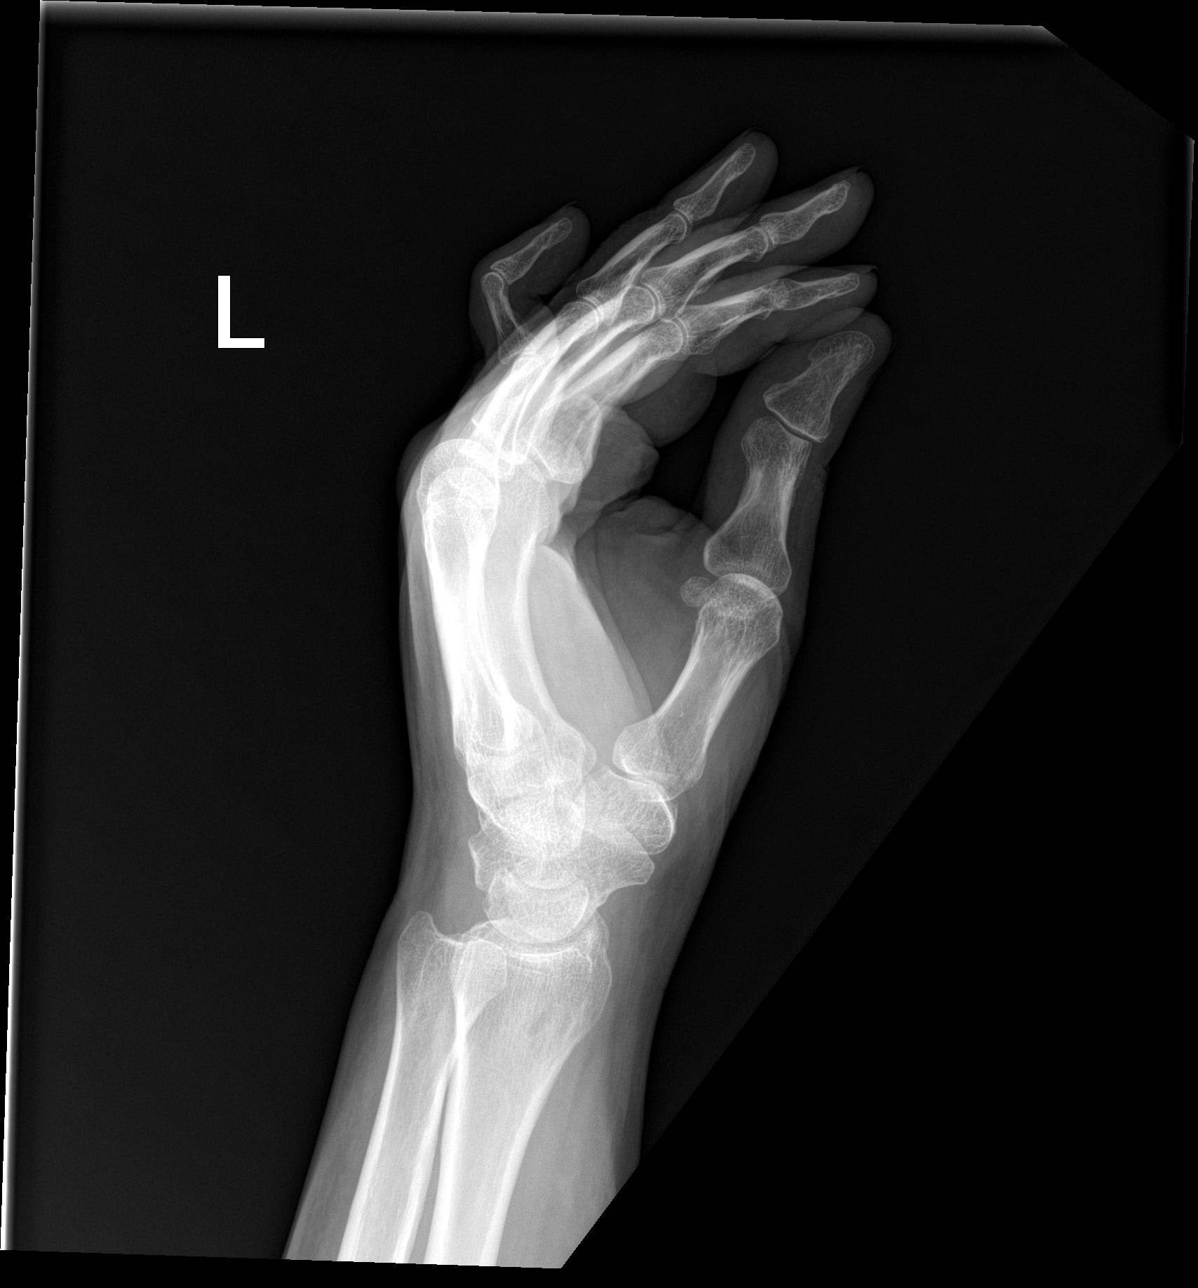

[3 of 3 positions shown; findings below may reference images not displayed]

FINDINGS: No acute fracture or dislocation. Fifth digit is held in flexion at
the distal interphalangeal joint and extension at the proximal
interphalangeal joint on all views. Otherwise normal alignment. No
focal soft tissue abnormality.
IMPRESSION: 1. No acute fracture or dislocation.
2. Flexion of the fifth digit at the distal interphalangeal joint
and extension at the proximal interphalangeal joint, of uncertain
acuity, but favored to be chronic.

## 2021-08-30 IMAGING — CT CT ABD-PELV W/O CM
2 of 4 series · 16 of 46 positions shown, 18 images · non-contrast
Comparison: 04/30/2020

CLINICAL DATA: Abdominal trauma. Multiple attempts at IV access
have failed.

EXAM:
CT ABDOMEN AND PELVIS WITHOUT CONTRAST
TECHNIQUE: Multidetector CT imaging of the abdomen and pelvis was performed
following the standard protocol without IV contrast.

[Series 3: ap without · axial · non-contrast · 0.93mm/px · z∈[-439,+86]mm · 13 of 123 slices shown, 15 images]
[im 9/123  soft-tissue]
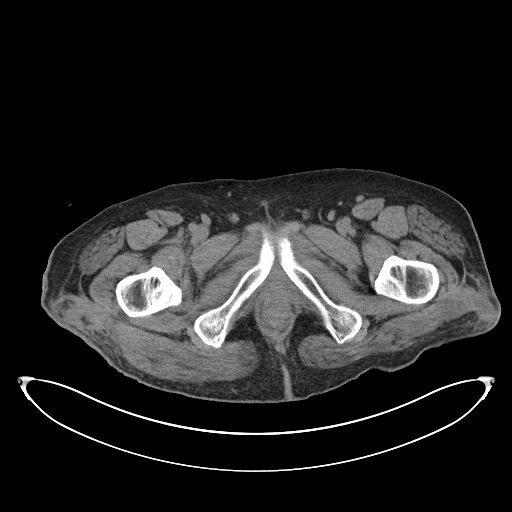
[im 9/123  bone]
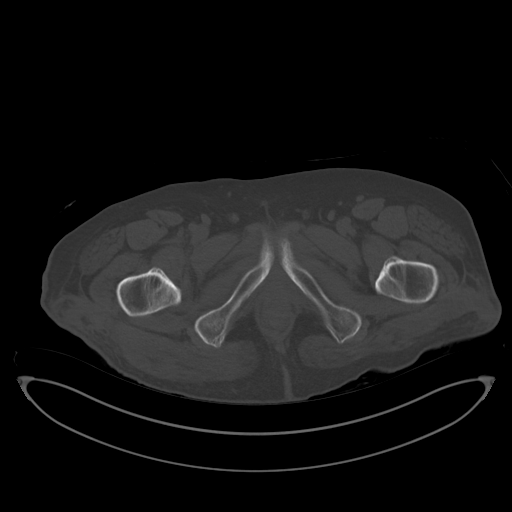
[im 17/123  soft-tissue]
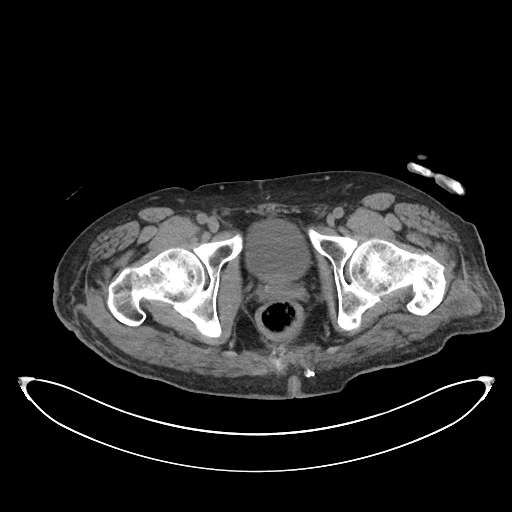
[im 25/123  soft-tissue]
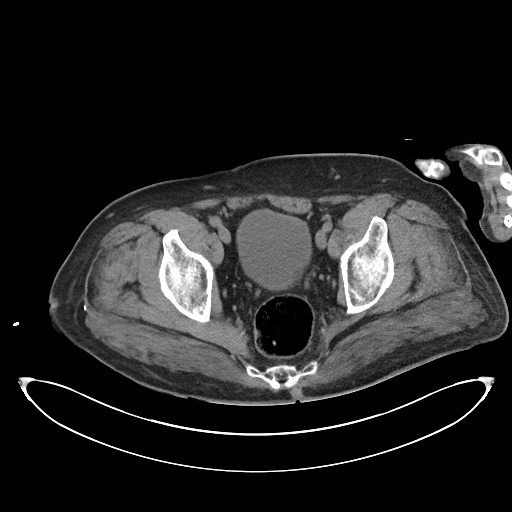
[im 33/123  soft-tissue]
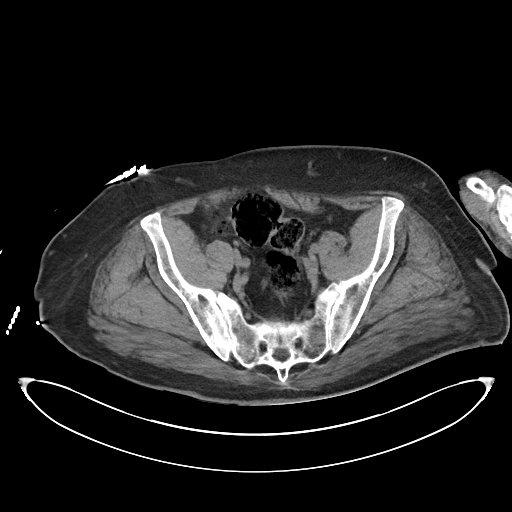
[im 41/123  soft-tissue]
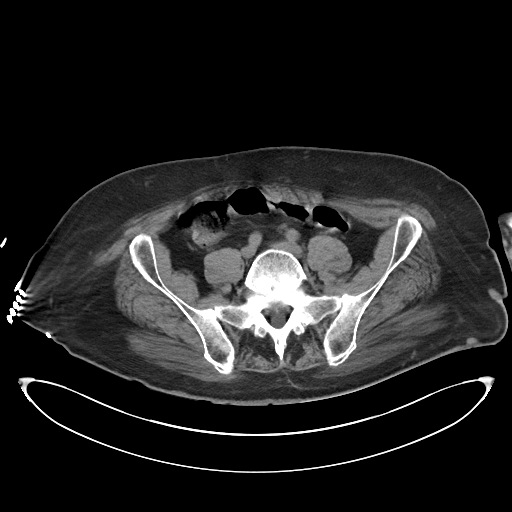
[im 49/123  soft-tissue]
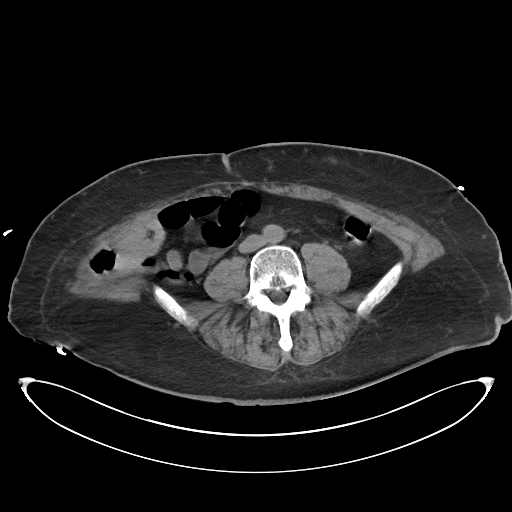
[im 66/123  soft-tissue]
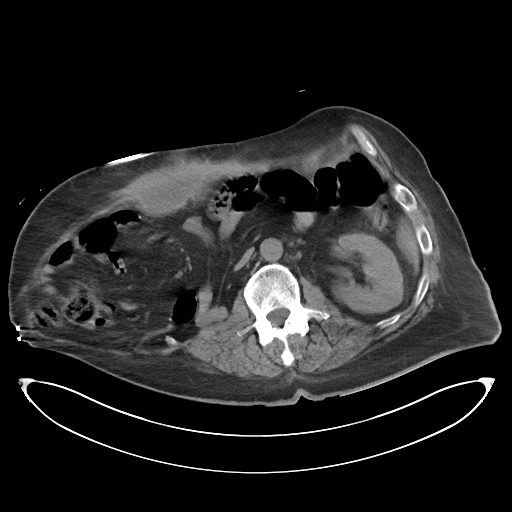
[im 74/123  soft-tissue]
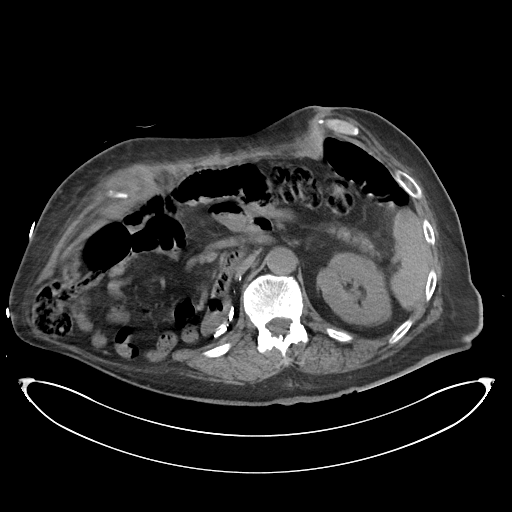
[im 82/123  soft-tissue]
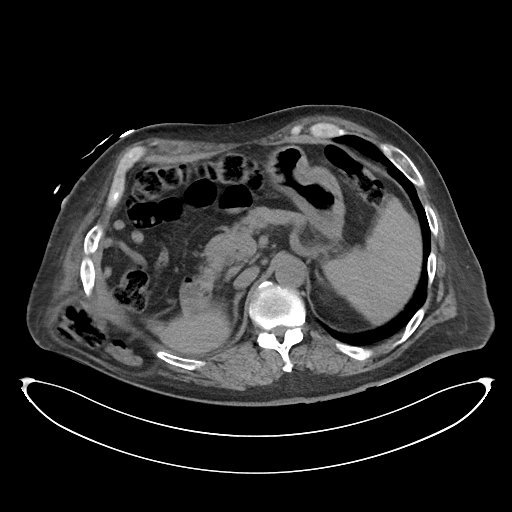
[im 82/123  bone]
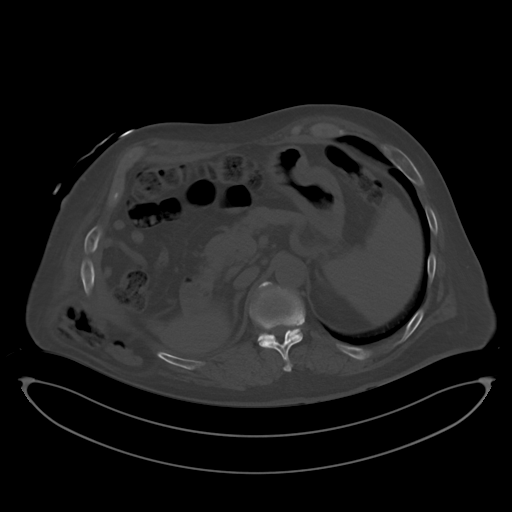
[im 90/123  soft-tissue]
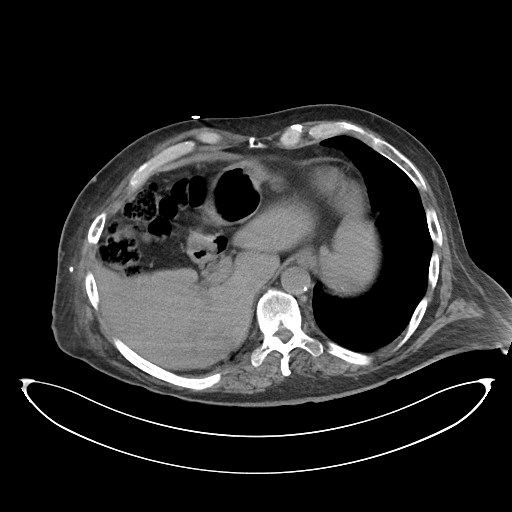
[im 98/123  soft-tissue]
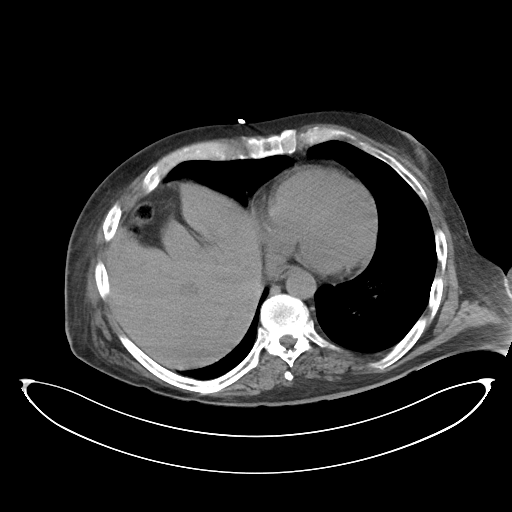
[im 106/123  soft-tissue]
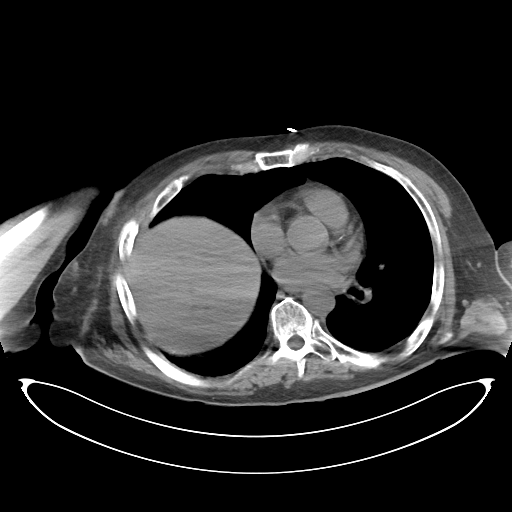
[im 114/123  soft-tissue]
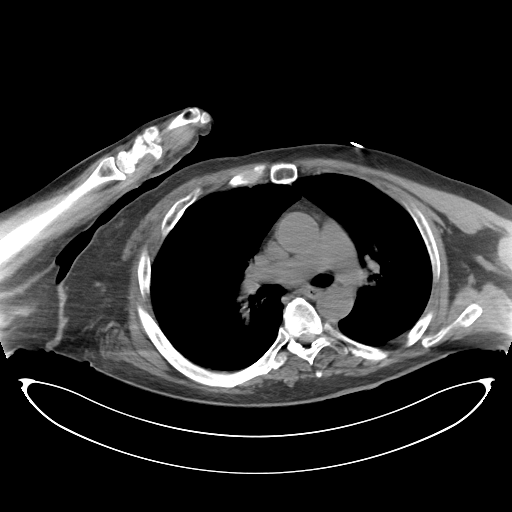

[Series 6: cor · coronal · 0.97mm/px · 3 of 97 slices shown]
[im 33/97  soft-tissue]
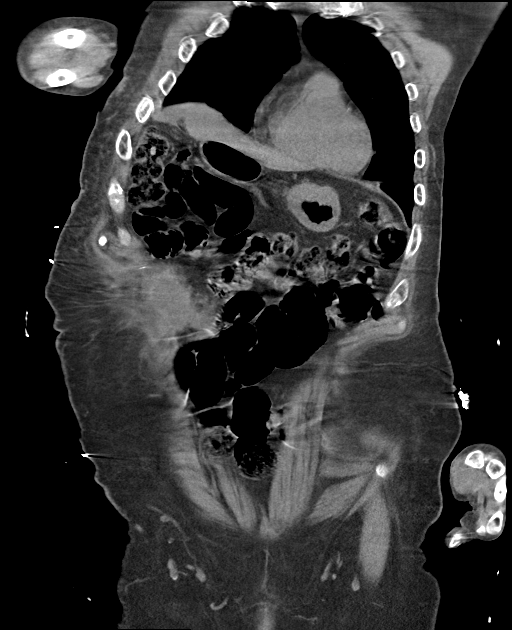
[im 43/97  soft-tissue]
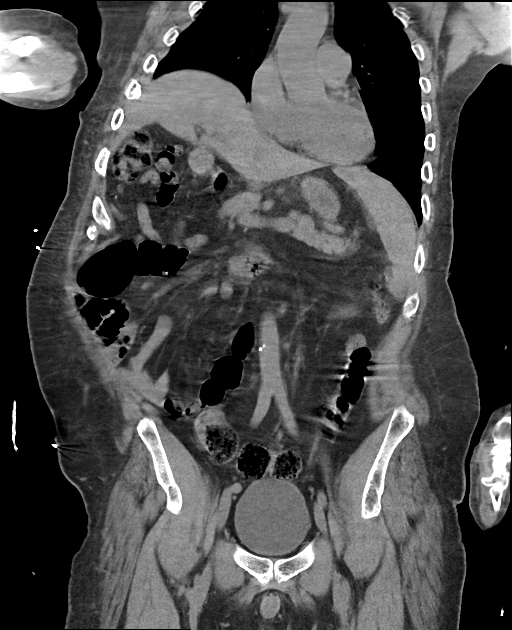
[im 54/97  soft-tissue]
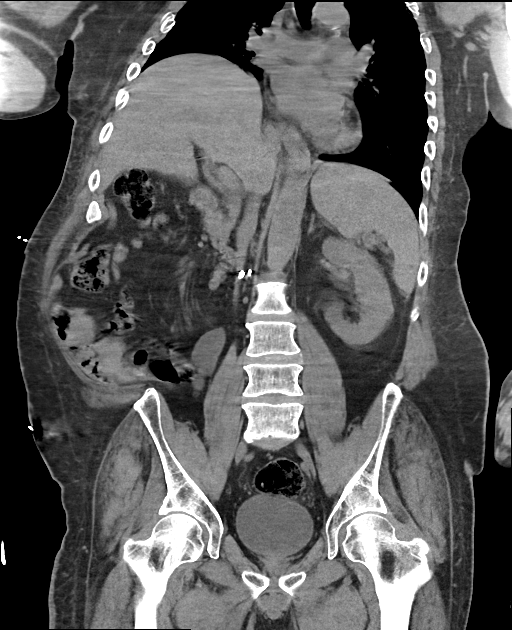

[16 of 46 positions shown; findings below may reference images not displayed]

FINDINGS: Lower chest:  No contributory findings.

Hepatobiliary: No focal liver abnormality.Cholecystectomy. No
hydronephrosis.

Pancreas: Unremarkable.

Spleen: Unremarkable.

Adrenals/Urinary Tract: Negative adrenals. No hydronephrosis or
stone. 3.1 cm cyst from the upper pole left kidney. Right
nephrectomy. Unremarkable bladder.

Stomach/Bowel:  No obstruction. No visible bowel inflammation.

Vascular/Lymphatic: No acute vascular abnormality. No mass or
adenopathy.

Reproductive:No pathologic findings.

Other: No ascites or pneumoperitoneum. Thick walled fluid collection
in the right upper quadrant anterior abdominal wall measuring 6 x
2.4 cm, increased in size from prior. There is a chronic adjacent
track like structure extending towards the umbilicus. There has been
history of infected abdominal wall hernia mesh and abscess. Lax
right abdominal wall, presumably incisional.

Musculoskeletal: No acute abnormalities. Subcutaneous stranding over
the bilateral greater trochanter which is likely pressure related.
Chronic L5 pars defects with L5-S1 anterolisthesis.
IMPRESSION: 1. No evidence of intra-abdominal injury.
2. ~6 cm right upper quadrant abdominal wall collection, chronic
although increased in size from 1412. There is history of
complicated hernia repair with mesh.
3. Additional chronic findings are described above.

## 2021-08-31 IMAGING — DX DG CHEST 2V
3 series · 3 of 3 positions shown · non-contrast
Comparison: 07/27/2021

CLINICAL DATA: Fell, right lower back hematoma

EXAM:
CHEST - 2 VIEW

[chest lat]
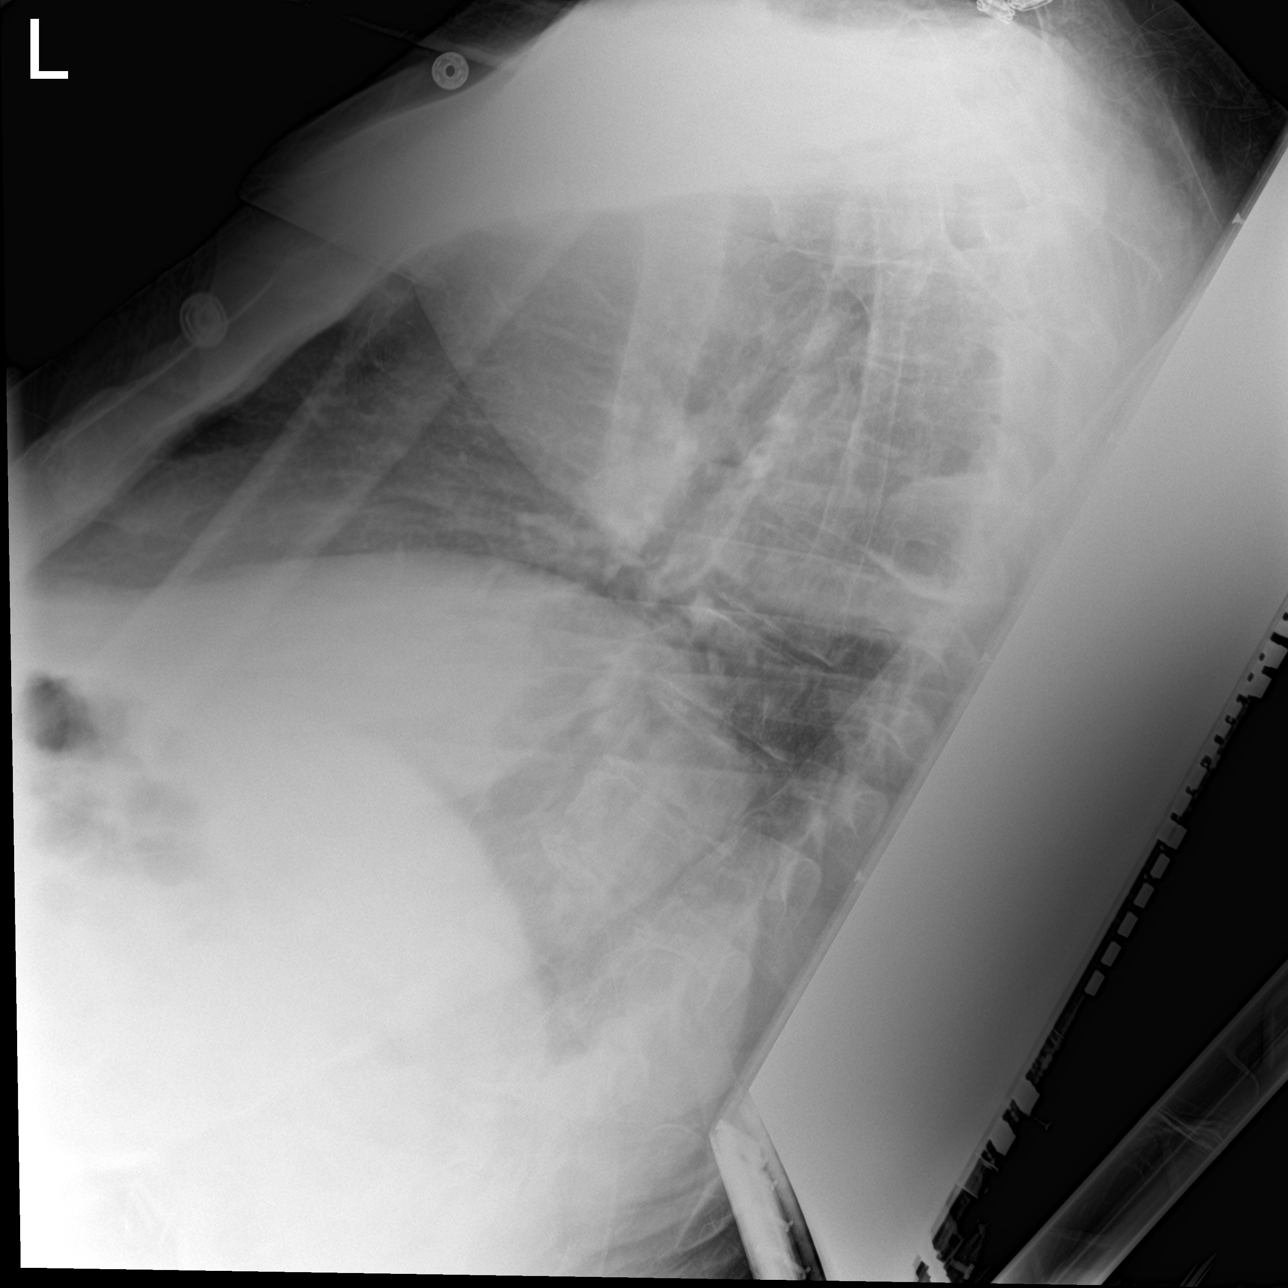

[chest ap (1 of 2)]
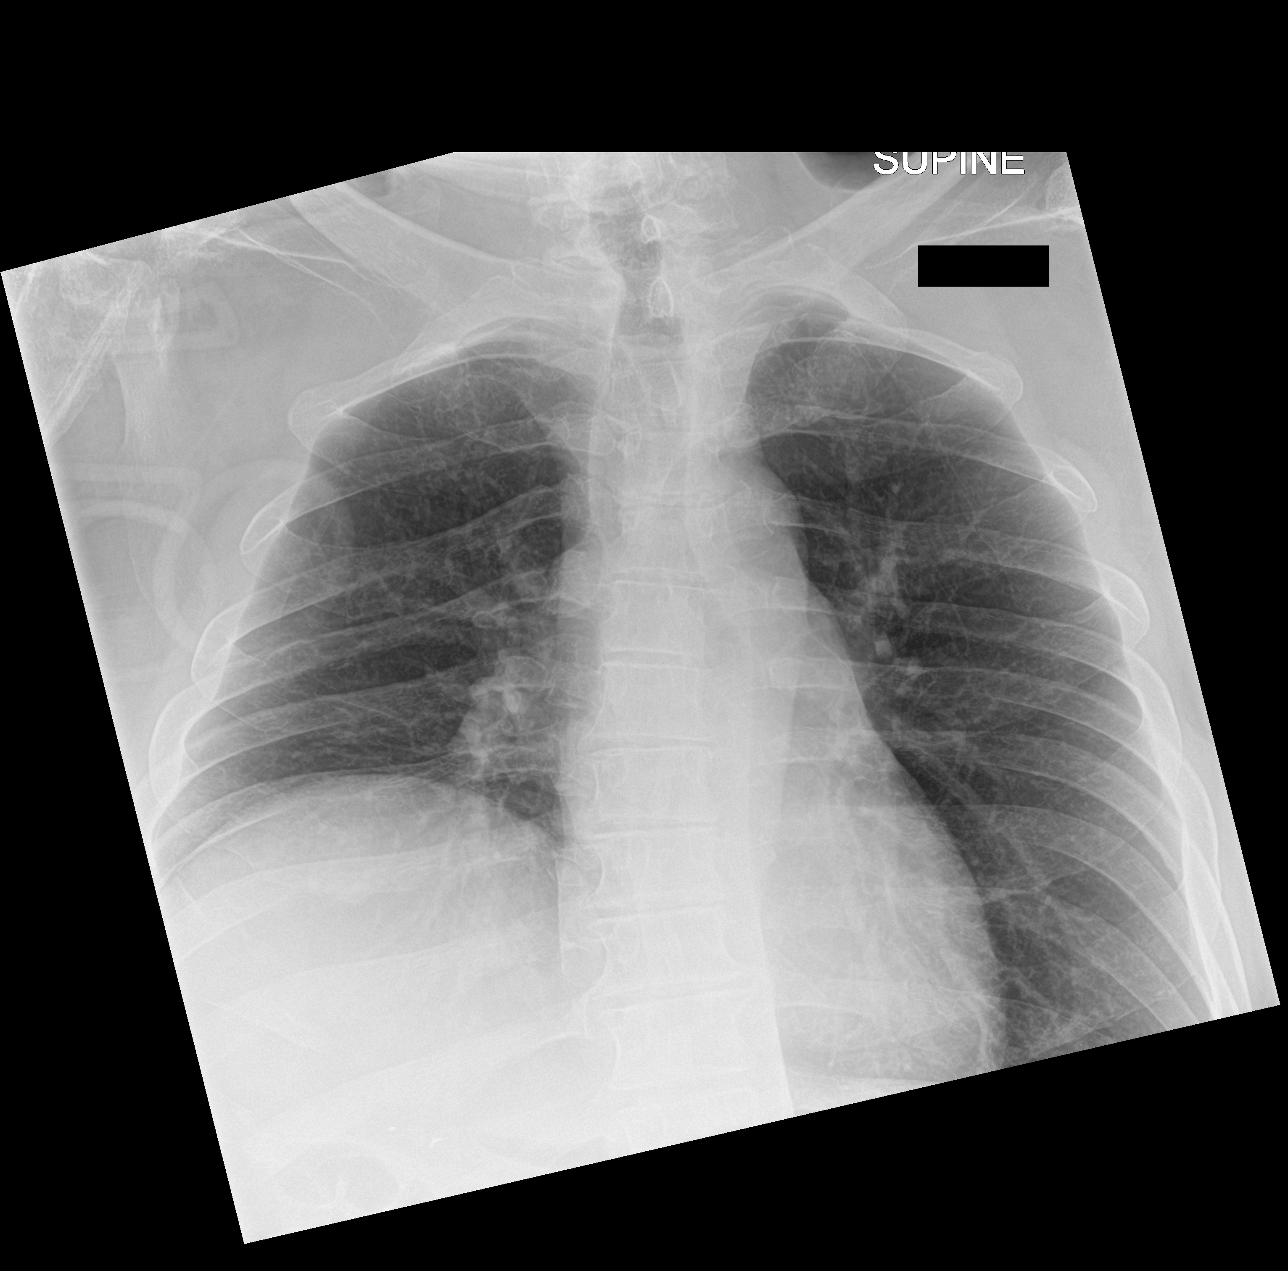

[chest ap (2 of 2)]
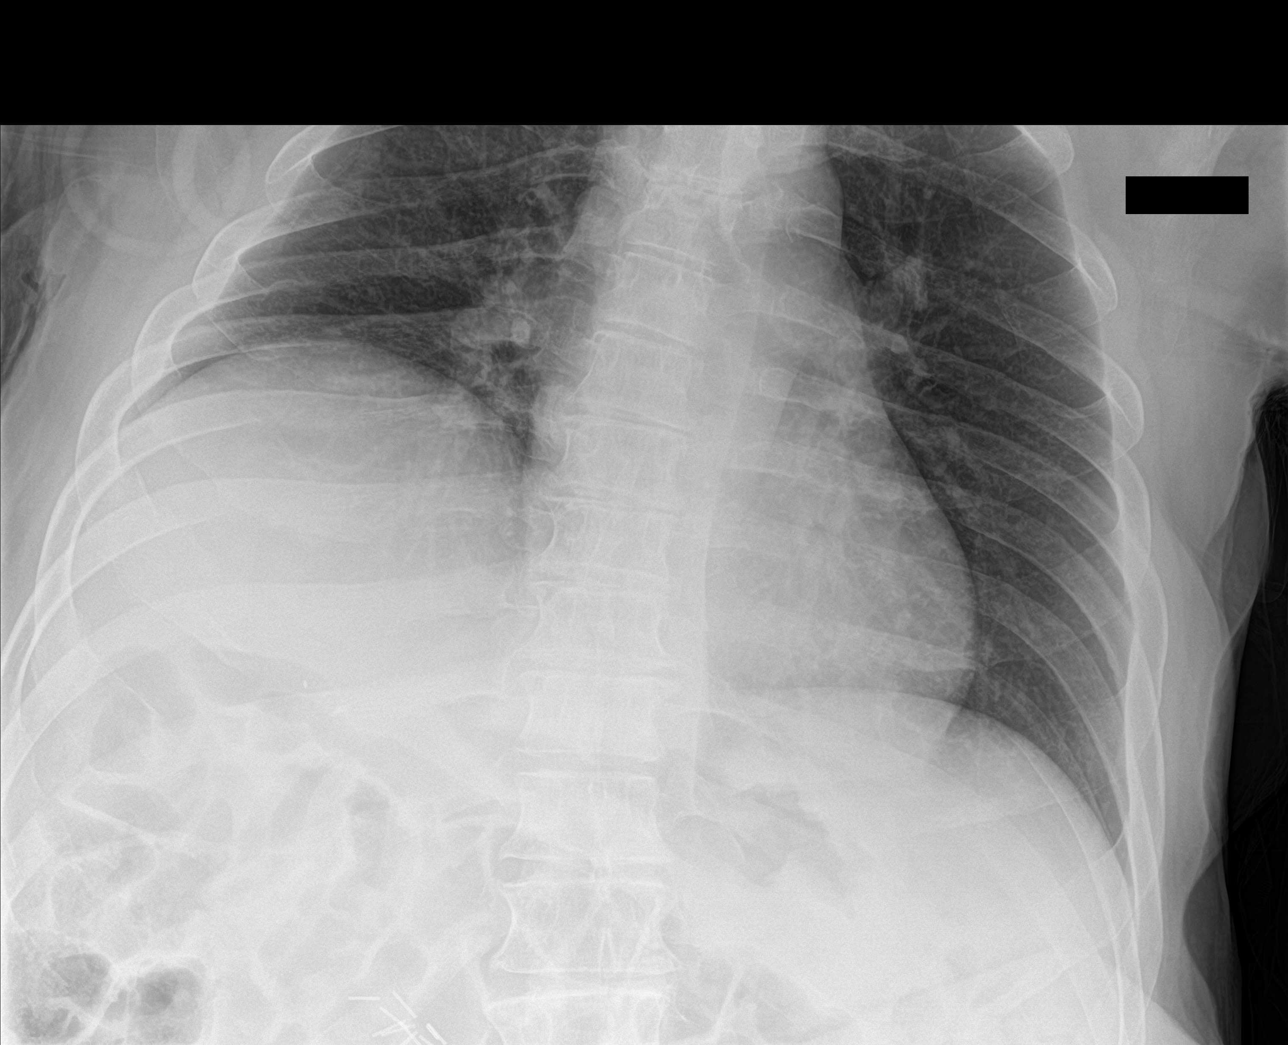

[3 of 3 positions shown; findings below may reference images not displayed]

FINDINGS: Frontal and lateral views of the chest demonstrate an unremarkable
cardiac silhouette. Chronic elevation right hemidiaphragm. No
airspace disease, effusion, or pneumothorax. No acute bony
abnormalities.
IMPRESSION: 1. No acute intrathoracic process.

## 2021-08-31 IMAGING — DX DG PELVIS 1-2V
1 series · 1 of 1 positions shown · non-contrast
Comparison: 07/27/2021

CLINICAL DATA: Fell, right lower back hematoma

EXAM:
PELVIS - 1-2 VIEW

[pelvis ap]
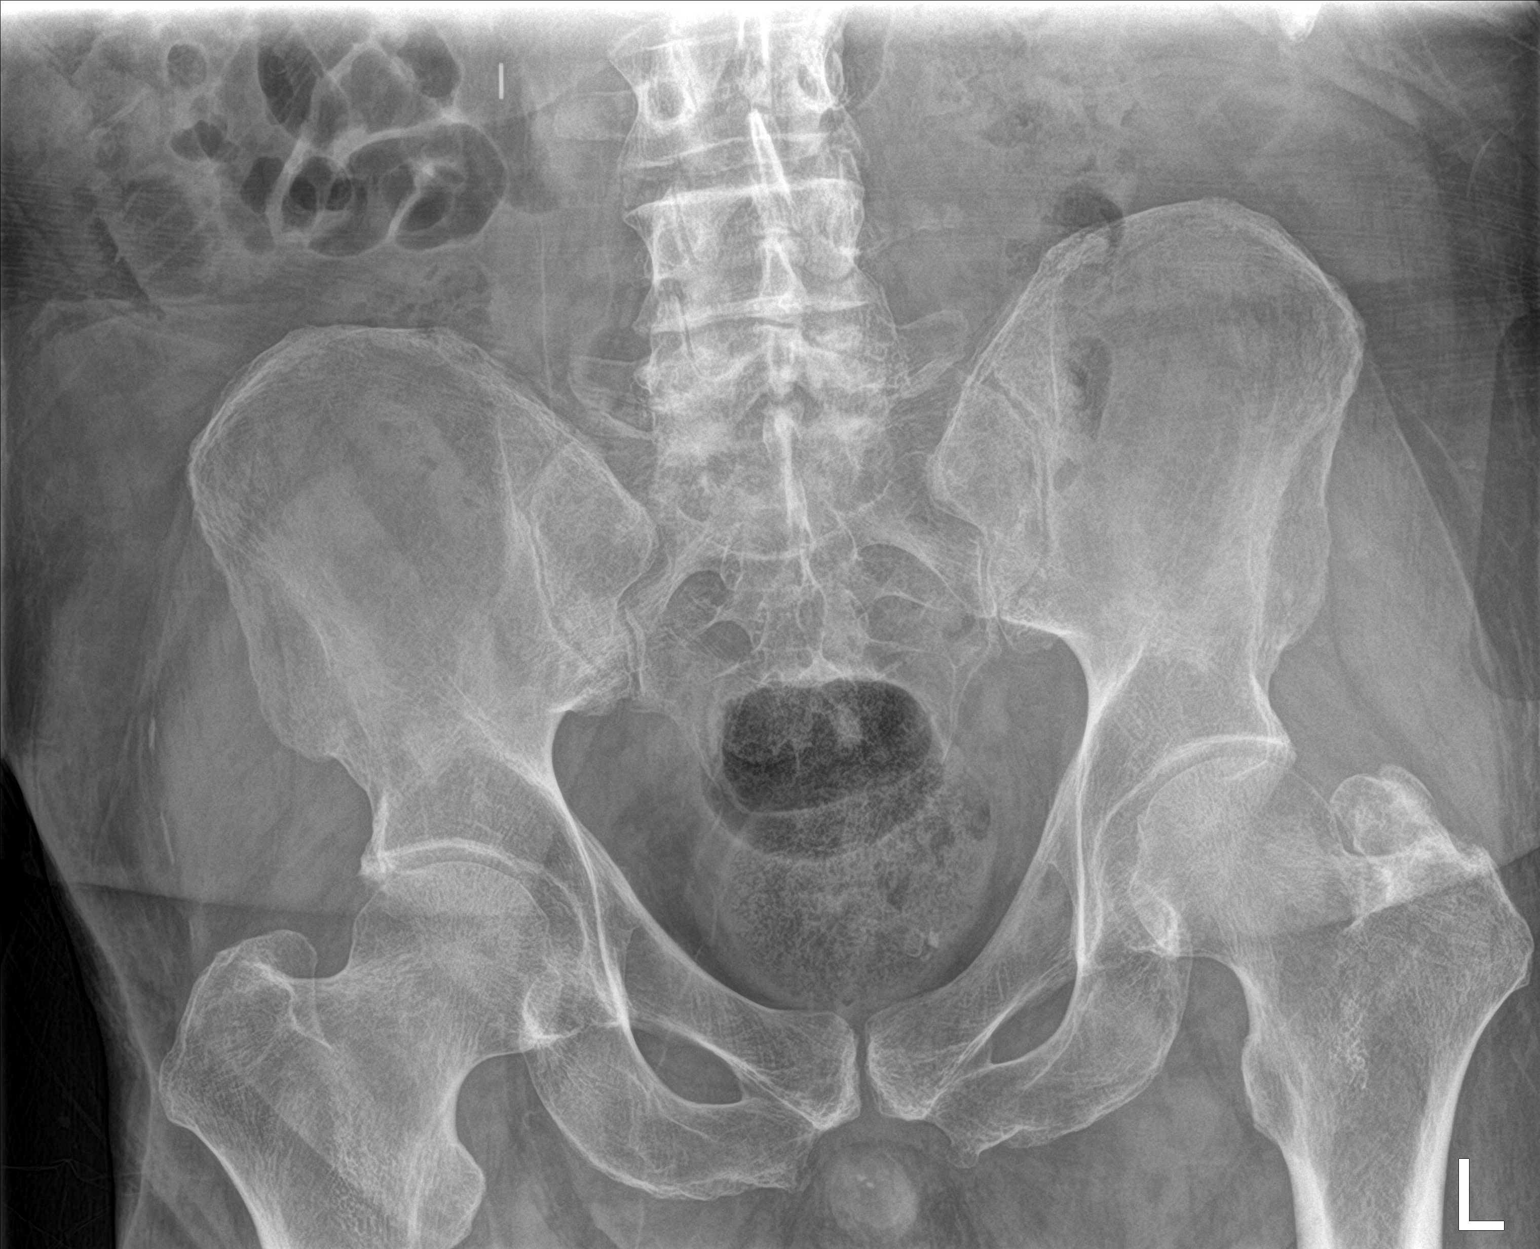

[1 of 1 positions shown; findings below may reference images not displayed]

FINDINGS: Supine frontal view of the pelvis includes both hips. No acute
displaced fracture, subluxation, or dislocation. Joint spaces are
relatively well preserved. Mild degenerative changes at the
lumbosacral junction. Sacroiliac joints are normal.
IMPRESSION: 1. No acute bony abnormality.

## 2021-09-01 IMAGING — CT CT CERVICAL SPINE W/O CM
3 of 4 series · 12 of 33 positions shown, 14 images · non-contrast
Comparison: None.

CLINICAL DATA: Polytrauma, critical, head/C-spine injury suspected;
Facial trauma. Unwitnessed fall, right facial injury/laceration.

EXAM:
CT HEAD WITHOUT CONTRAST
CT MAXILLOFACIAL WITHOUT CONTRAST
CT CERVICAL SPINE WITHOUT CONTRAST
TECHNIQUE: Multidetector CT imaging of the head, cervical spine, and
maxillofacial structures were performed using the standard protocol
without intravenous contrast. Multiplanar CT image reconstructions
of the cervical spine and maxillofacial structures were also
generated.

[Series 5: c_spine 2.0 3 st · axial · 0.32mm/px · z∈[-286,-158]mm · 4 of 96 slices shown, 5 images]
[im 16/96  soft-tissue]
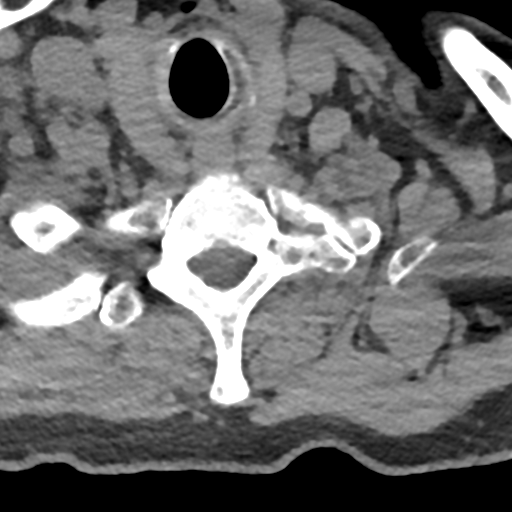
[im 16/96  bone]
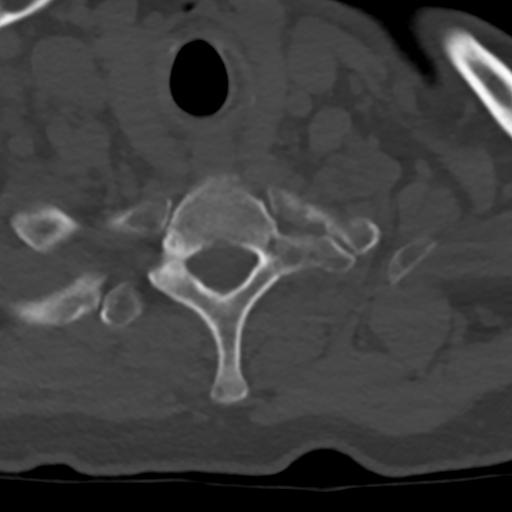
[im 32/96  bone]
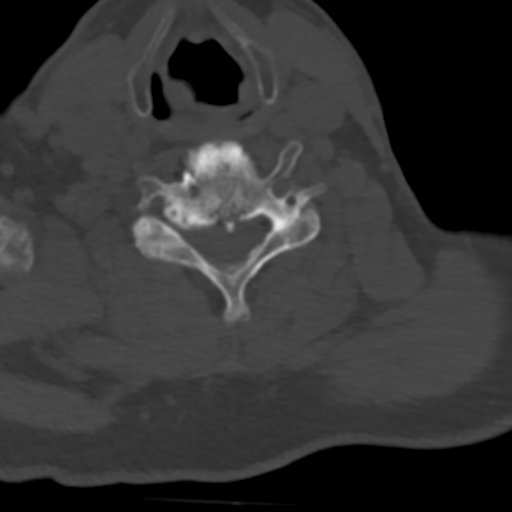
[im 64/96  bone]
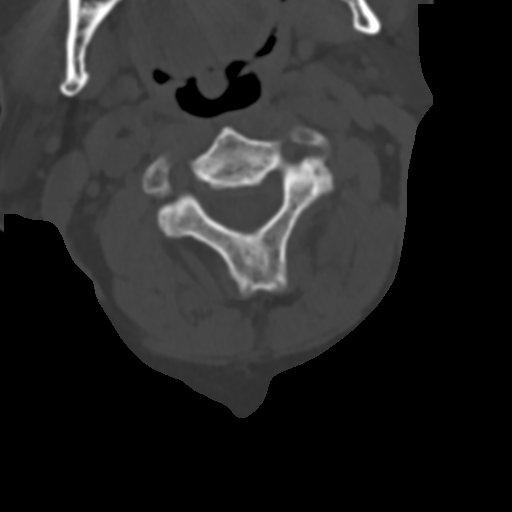
[im 80/96  bone]
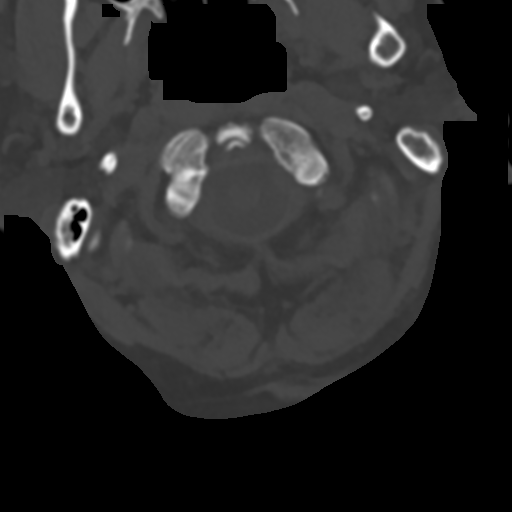

[Series 8: coronal bone · coronal · 0.28mm/px · 3 of 61 slices shown]
[im 13/61  bone]
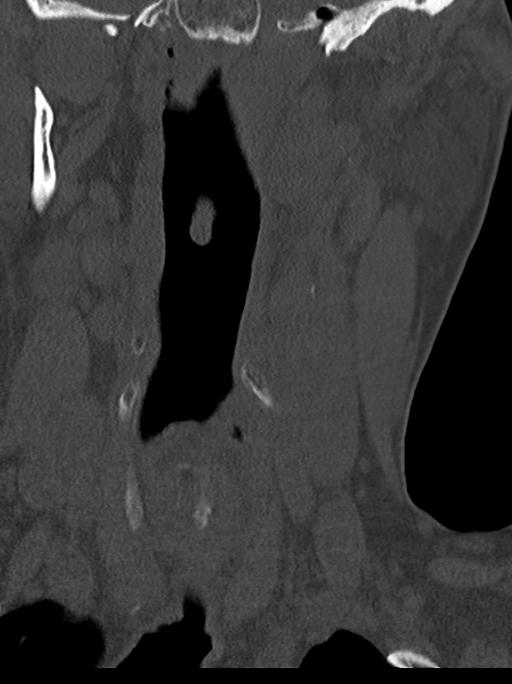
[im 25/61  bone]
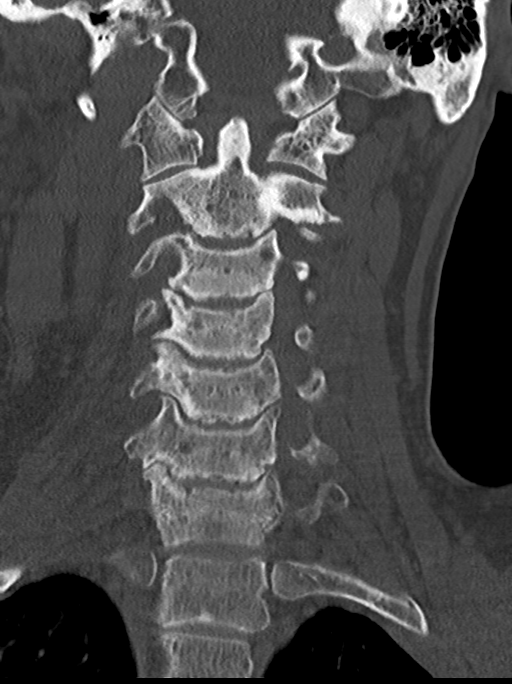
[im 37/61  bone]
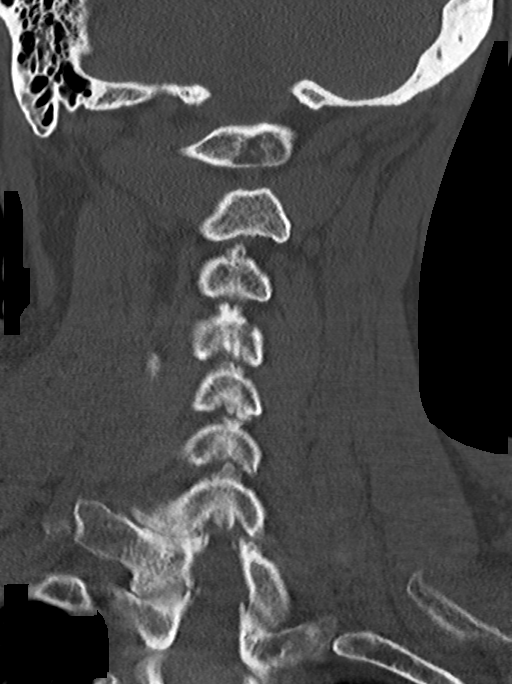

[Series 9: sagittal bone · sagittal · 0.29mm/px · 5 of 60 slices shown, 6 images]
[im 20/60  bone]
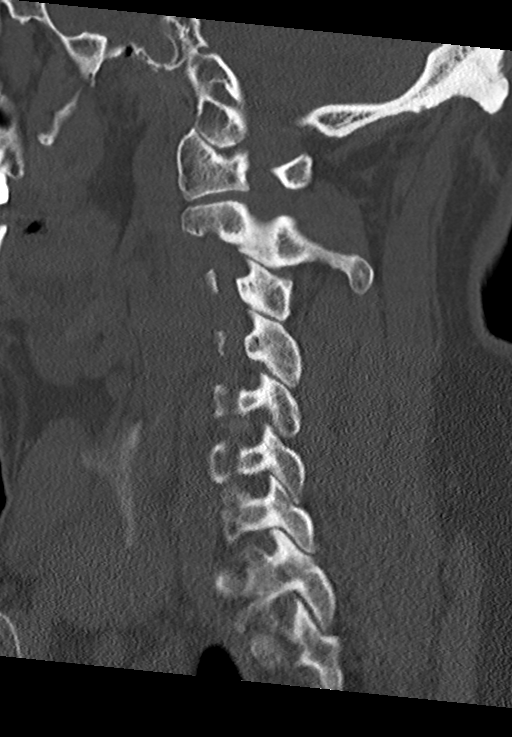
[im 25/60  bone]
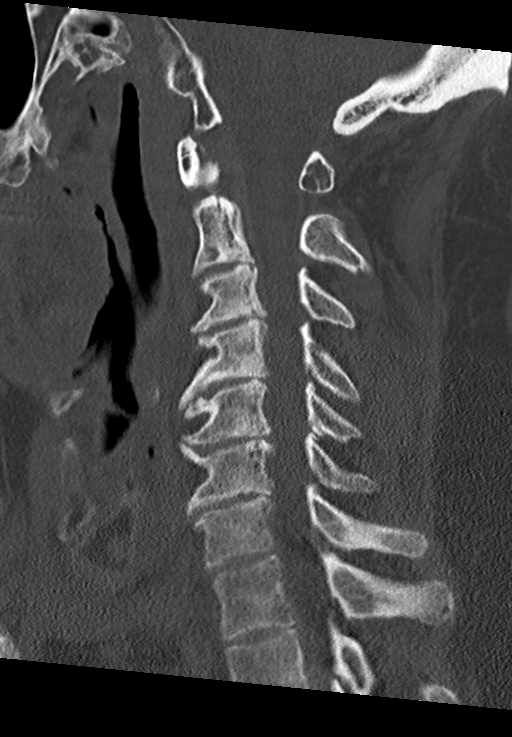
[im 30/60  soft-tissue]
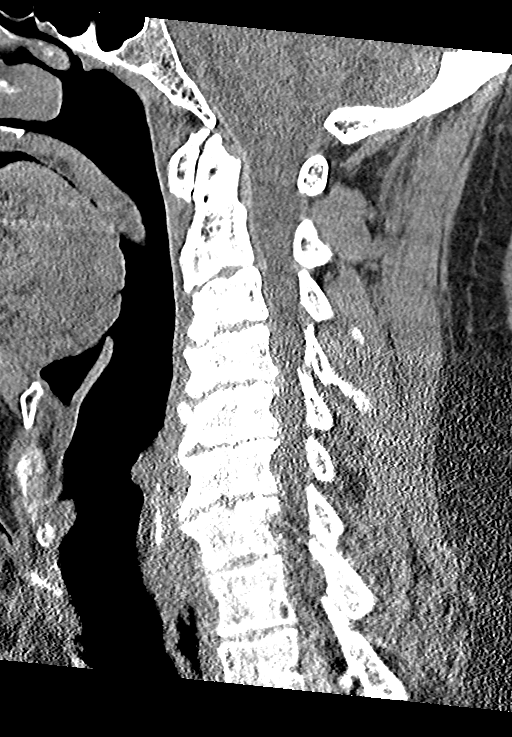
[im 30/60  bone]
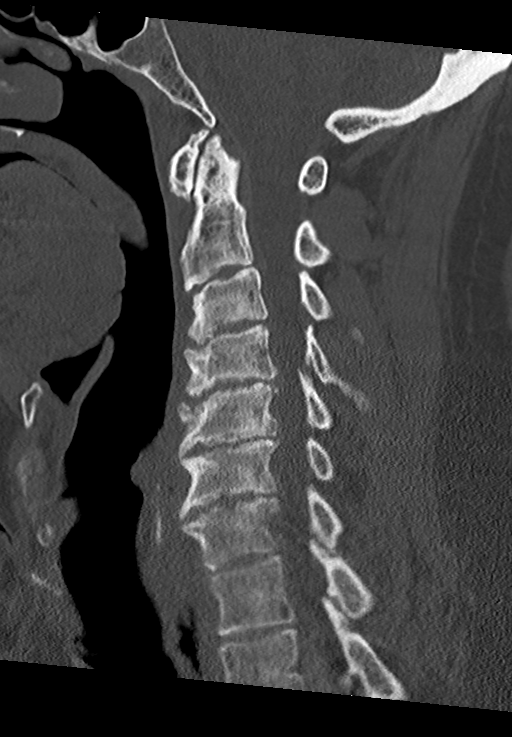
[im 35/60  bone]
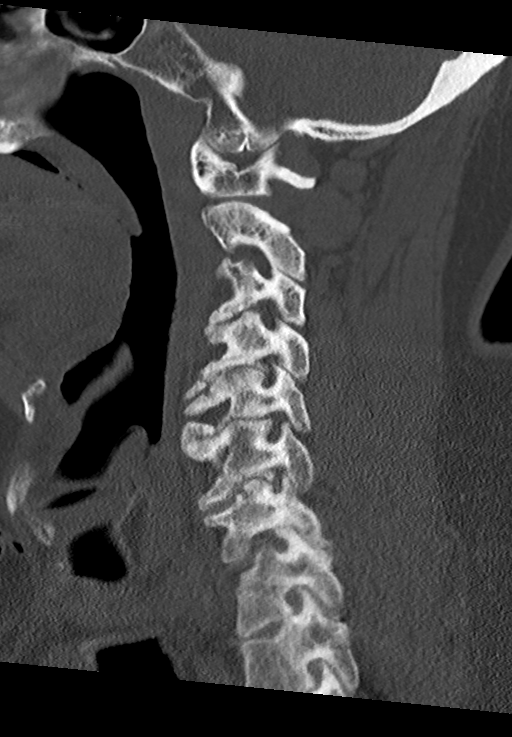
[im 40/60  bone]
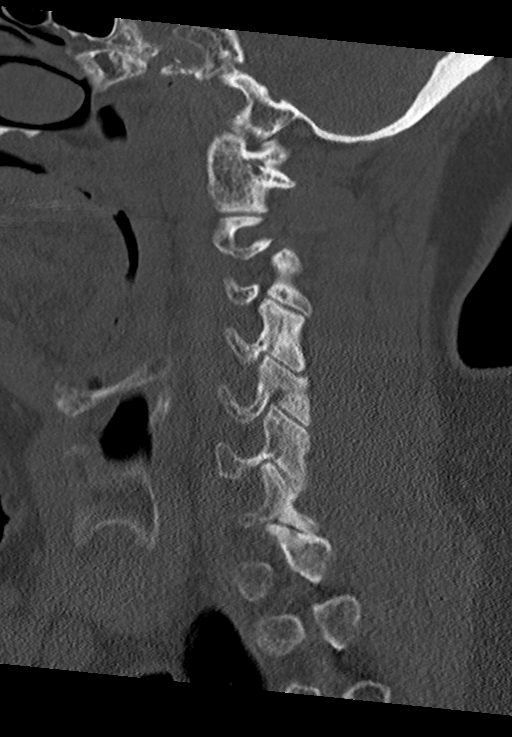

[12 of 33 positions shown; findings below may reference images not displayed]

FINDINGS: CT HEAD FINDINGS

Brain: Normal anatomic configuration. Mild parenchymal volume loss
is present, slightly advanced in relation of the patient's stated
age. No abnormal intra or extra-axial mass lesion or fluid
collection. No abnormal mass effect or midline shift. No evidence of
acute intracranial hemorrhage or infarct. Ventricular size is
normal. Cerebellum unremarkable.

Vascular: Unremarkable

Skull: Intact

Other: Mastoid air cells and middle ear cavities are clear.

CT MAXILLOFACIAL FINDINGS

Osseous: There is no acute facial fracture identified. A remote
fracture of the nasal bones and nasal septum is identified. There is
erosion of the central aspect of the nasal septum.

Orbits: Negative. No traumatic or inflammatory finding.

Sinuses: Clear bilaterally

Soft tissues: There is mild soft tissue swelling superficial to the
right zygomaticomaxillary complex.

CT CERVICAL SPINE FINDINGS

Alignment: There is overall straightening of the cervical spine. No
listhesis.

Skull base and vertebrae: The craniocervical alignment is normal.
The atlantodental interval is not widened. There is no acute
fracture of the cervical spine.

Soft tissues and spinal canal: There is diffuse narrowing of the
spinal canal secondary to congenital shortening of the pedicles.
Superimposed posterior disc osteophyte complex ease at C3-C7 results
in moderate central canal stenosis with flattening of the thecal
sac, most severe at C4-5 with an AP diameter of the spinal canal of
approximately 7 mm. No canal hematoma. No prevertebral soft tissue
swelling. No paraspinal fluid collections are identified.

Disc levels: There is diffuse intervertebral disc space narrowing
and endplate remodeling throughout the cervical spine in keeping
with changes of diffuse moderate to severe degenerative disc
disease. Vertebral body height has been preserved. The prevertebral
soft tissues are not thickened on sagittal reformats. Review of the
axial images demonstrates multilevel uncovertebral arthrosis
resulting in multilevel mild-to-moderate neuroforaminal narrowing,
most severe on the right at C4-5, bilaterally at C5-6, and on the
right at C6-7 and C7-T1.

Upper chest: Unremarkable

Other: None
IMPRESSION: No acute intracranial injury.  No calvarial fracture.

Mild soft tissue swelling superficial to the right
zygomaticomaxillary complex. No acute facial fracture.

Remote fracture of the nasal bones and nasal septum. Superimposed
erosion of the central nasal septum.

No acute fracture or listhesis of the cervical spine.

Multilevel advanced degenerative disc and degenerative joint disease
which, in combination with congenital narrowing of the spinal canal,
results in moderate central canal stenosis at C3-C7 and multilevel
neuroforaminal narrowing, most severe at C4-T1 as described above.

## 2021-09-01 IMAGING — CT CT HEAD W/O CM
3 series · 14 of 47 positions shown, 16 images · non-contrast
Comparison: None.

CLINICAL DATA: Polytrauma, critical, head/C-spine injury suspected;
Facial trauma. Unwitnessed fall, right facial injury/laceration.

EXAM:
CT HEAD WITHOUT CONTRAST
CT MAXILLOFACIAL WITHOUT CONTRAST
CT CERVICAL SPINE WITHOUT CONTRAST
TECHNIQUE: Multidetector CT imaging of the head, cervical spine, and
maxillofacial structures were performed using the standard protocol
without intravenous contrast. Multiplanar CT image reconstructions
of the cervical spine and maxillofacial structures were also
generated.

[Series 3: head 5.0 h30s · axial · 0.42mm/px · z∈[-133,+12]mm · 8 of 35 slices shown, 10 images]
[im 3/35  brain]
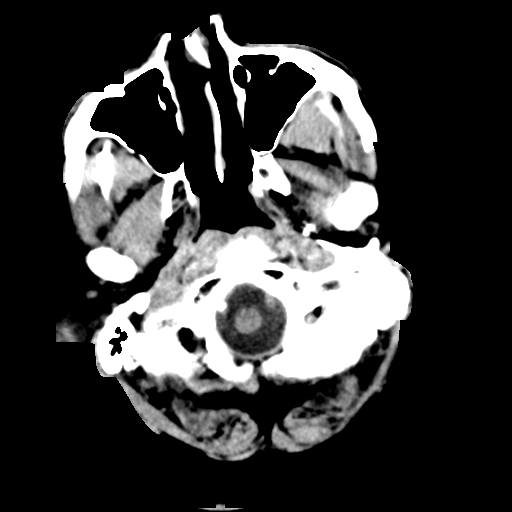
[im 3/35  bone]
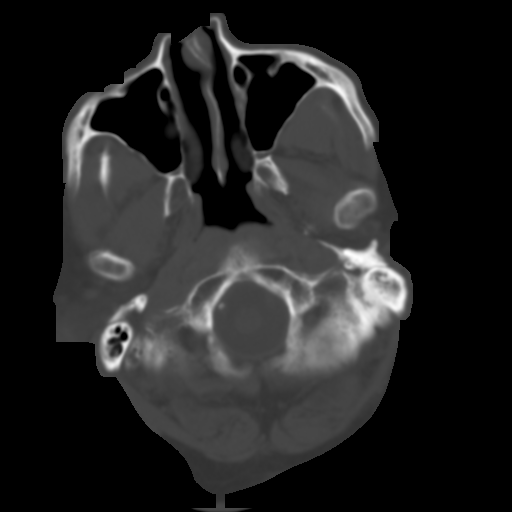
[im 8/35  brain]
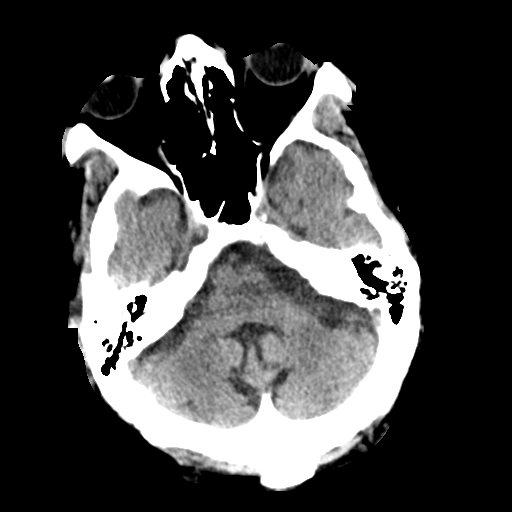
[im 11/35  brain]
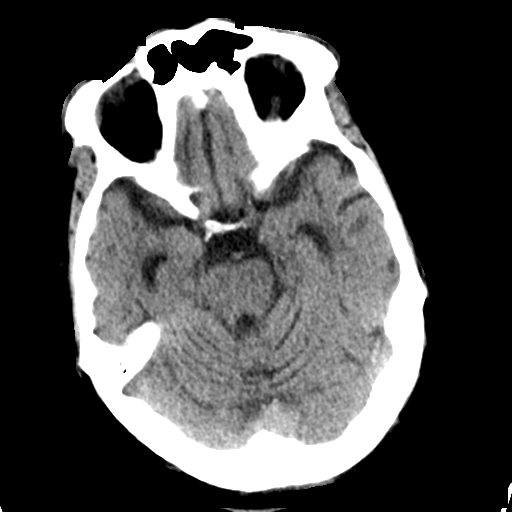
[im 16/35  brain]
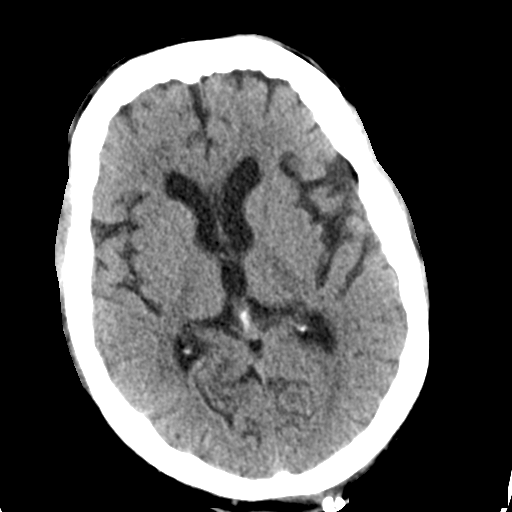
[im 19/35  brain]
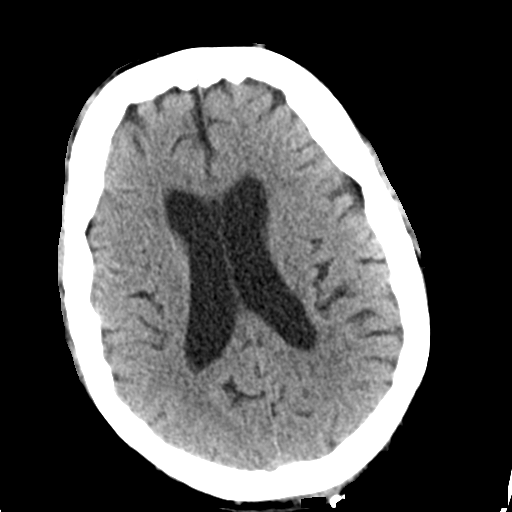
[im 19/35  bone]
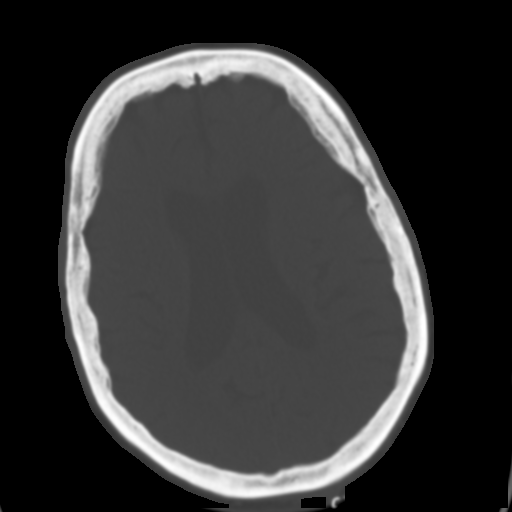
[im 24/35  brain]
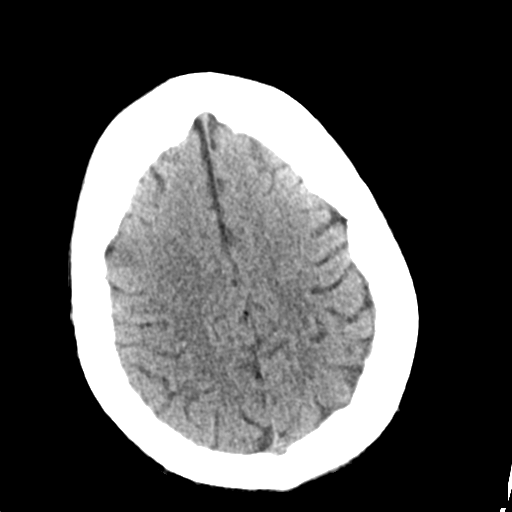
[im 27/35  brain]
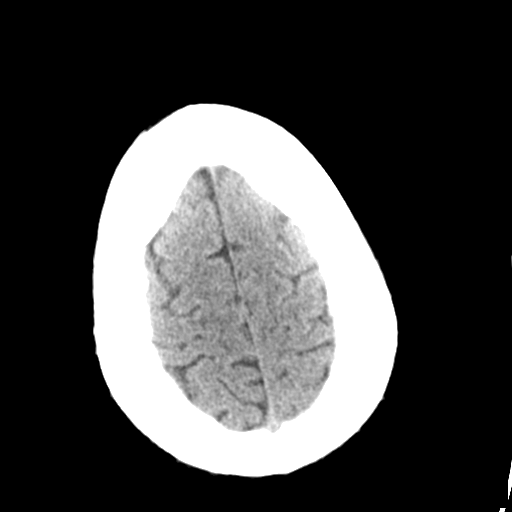
[im 32/35  brain]
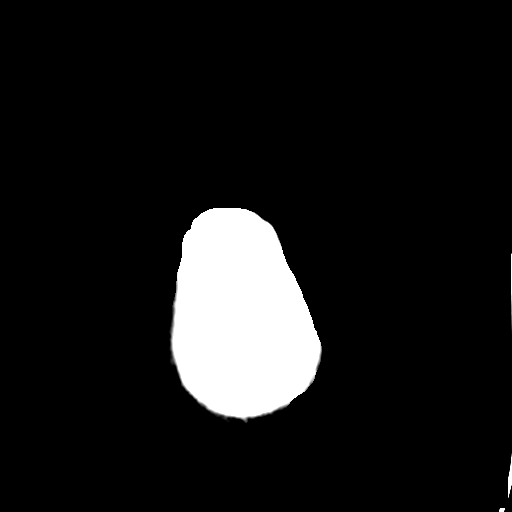

[Series 5: head 3.0 mpr cor · coronal · 0.34mm/px · 3 of 71 slices shown]
[im 24/71  brain]
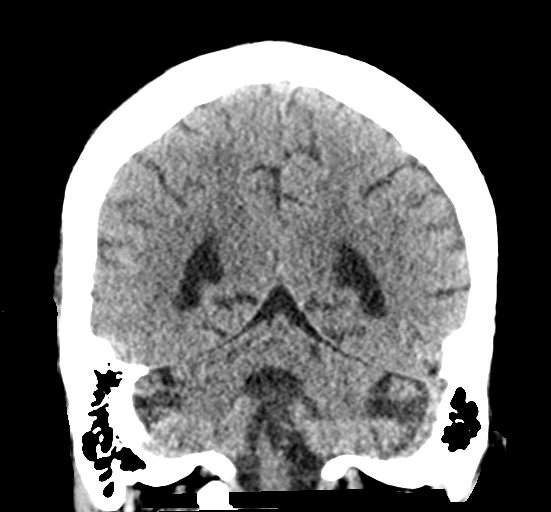
[im 32/71  brain]
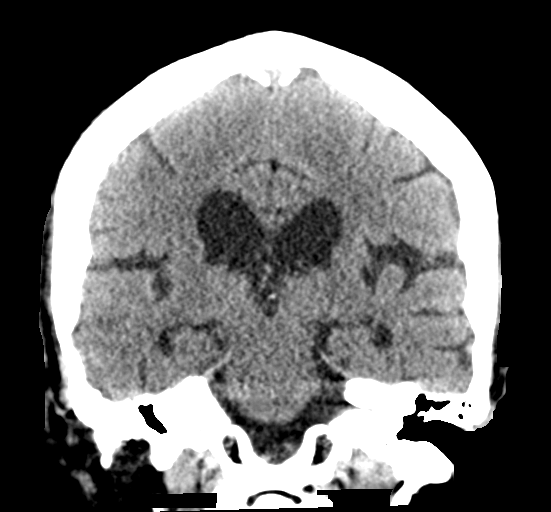
[im 39/71  brain]
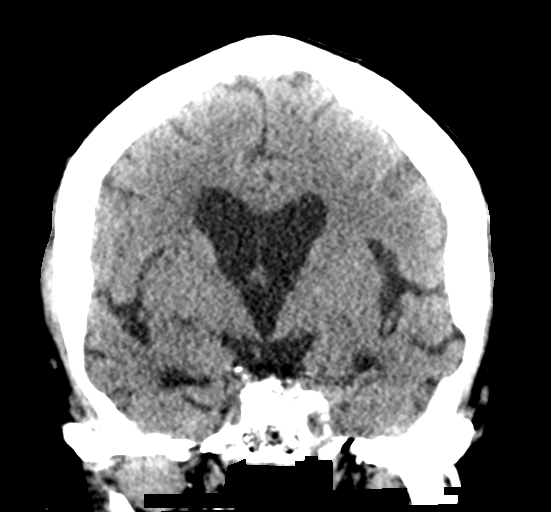

[Series 6: head 3.0 mpr sag · sagittal · 0.34mm/px · 3 of 60 slices shown]
[im 20/60  brain]
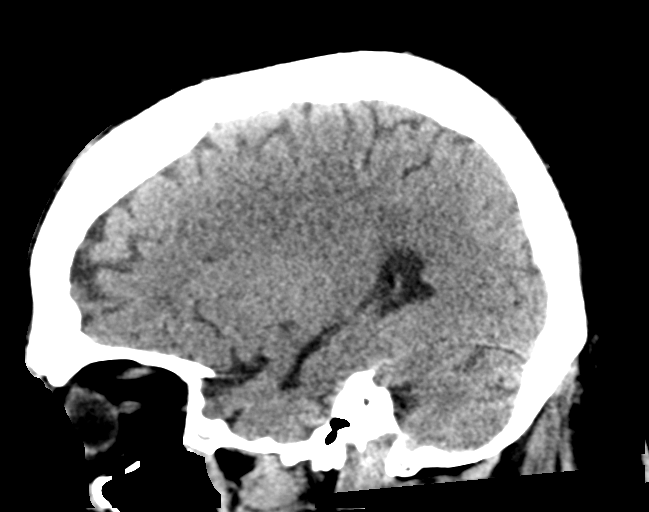
[im 30/60  brain]
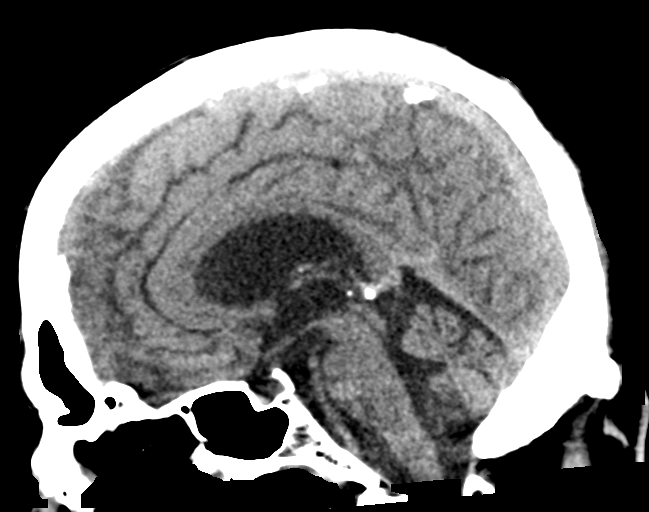
[im 40/60  brain]
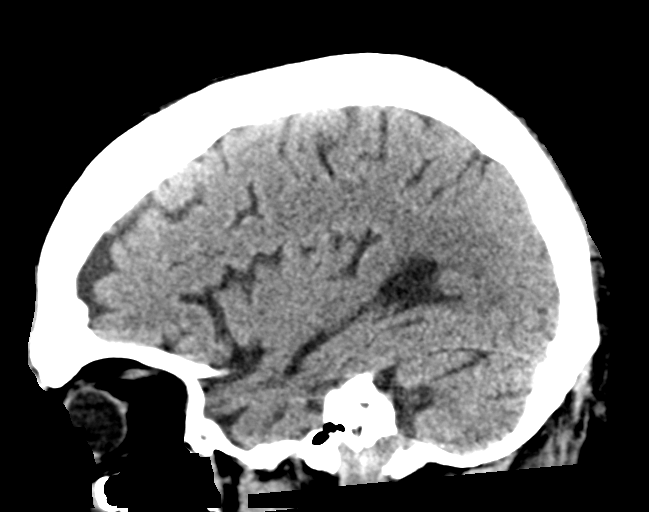

[14 of 47 positions shown; findings below may reference images not displayed]

FINDINGS: CT HEAD FINDINGS

Brain: Normal anatomic configuration. Mild parenchymal volume loss
is present, slightly advanced in relation of the patient's stated
age. No abnormal intra or extra-axial mass lesion or fluid
collection. No abnormal mass effect or midline shift. No evidence of
acute intracranial hemorrhage or infarct. Ventricular size is
normal. Cerebellum unremarkable.

Vascular: Unremarkable

Skull: Intact

Other: Mastoid air cells and middle ear cavities are clear.

CT MAXILLOFACIAL FINDINGS

Osseous: There is no acute facial fracture identified. A remote
fracture of the nasal bones and nasal septum is identified. There is
erosion of the central aspect of the nasal septum.

Orbits: Negative. No traumatic or inflammatory finding.

Sinuses: Clear bilaterally

Soft tissues: There is mild soft tissue swelling superficial to the
right zygomaticomaxillary complex.

CT CERVICAL SPINE FINDINGS

Alignment: There is overall straightening of the cervical spine. No
listhesis.

Skull base and vertebrae: The craniocervical alignment is normal.
The atlantodental interval is not widened. There is no acute
fracture of the cervical spine.

Soft tissues and spinal canal: There is diffuse narrowing of the
spinal canal secondary to congenital shortening of the pedicles.
Superimposed posterior disc osteophyte complex ease at C3-C7 results
in moderate central canal stenosis with flattening of the thecal
sac, most severe at C4-5 with an AP diameter of the spinal canal of
approximately 7 mm. No canal hematoma. No prevertebral soft tissue
swelling. No paraspinal fluid collections are identified.

Disc levels: There is diffuse intervertebral disc space narrowing
and endplate remodeling throughout the cervical spine in keeping
with changes of diffuse moderate to severe degenerative disc
disease. Vertebral body height has been preserved. The prevertebral
soft tissues are not thickened on sagittal reformats. Review of the
axial images demonstrates multilevel uncovertebral arthrosis
resulting in multilevel mild-to-moderate neuroforaminal narrowing,
most severe on the right at C4-5, bilaterally at C5-6, and on the
right at C6-7 and C7-T1.

Upper chest: Unremarkable

Other: None
IMPRESSION: No acute intracranial injury.  No calvarial fracture.

Mild soft tissue swelling superficial to the right
zygomaticomaxillary complex. No acute facial fracture.

Remote fracture of the nasal bones and nasal septum. Superimposed
erosion of the central nasal septum.

No acute fracture or listhesis of the cervical spine.

Multilevel advanced degenerative disc and degenerative joint disease
which, in combination with congenital narrowing of the spinal canal,
results in moderate central canal stenosis at C3-C7 and multilevel
neuroforaminal narrowing, most severe at C4-T1 as described above.

## 2021-09-01 IMAGING — CT CT MAXILLOFACIAL W/O CM
4 of 5 series · 15 of 47 positions shown, 17 images · non-contrast
Comparison: None.

CLINICAL DATA: Polytrauma, critical, head/C-spine injury suspected;
Facial trauma. Unwitnessed fall, right facial injury/laceration.

EXAM:
CT HEAD WITHOUT CONTRAST
CT MAXILLOFACIAL WITHOUT CONTRAST
CT CERVICAL SPINE WITHOUT CONTRAST
TECHNIQUE: Multidetector CT imaging of the head, cervical spine, and
maxillofacial structures were performed using the standard protocol
without intravenous contrast. Multiplanar CT image reconstructions
of the cervical spine and maxillofacial structures were also
generated.

[Series 3: facial/ orbits 2.0 h30s · axial · 0.35mm/px · z∈[-221,-93]mm · 7 of 86 slices shown, 9 images]
[im 11/86  brain]
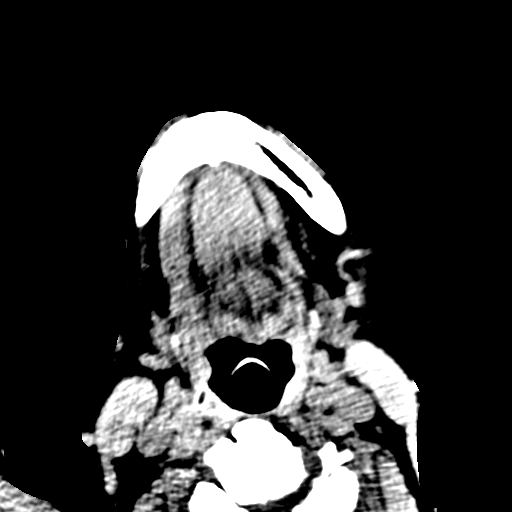
[im 11/86  bone]
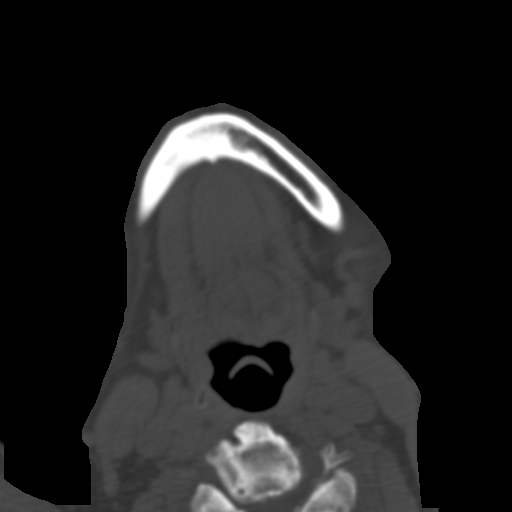
[im 22/86  bone]
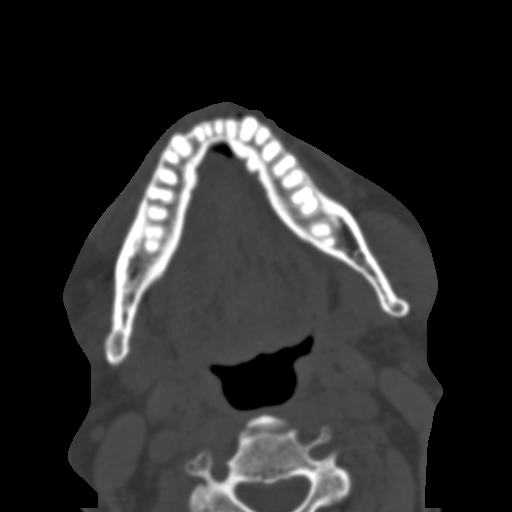
[im 32/86  bone]
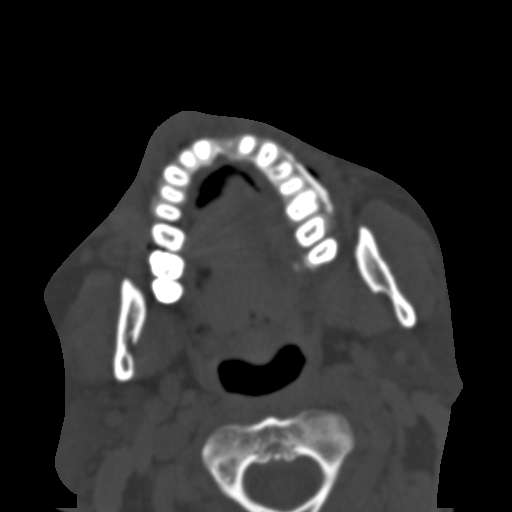
[im 43/86  bone]
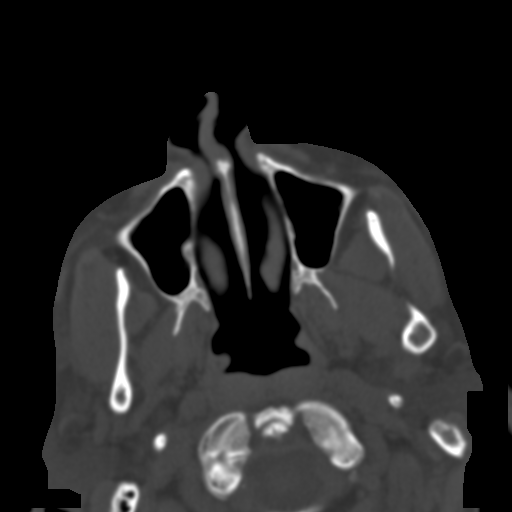
[im 54/86  brain]
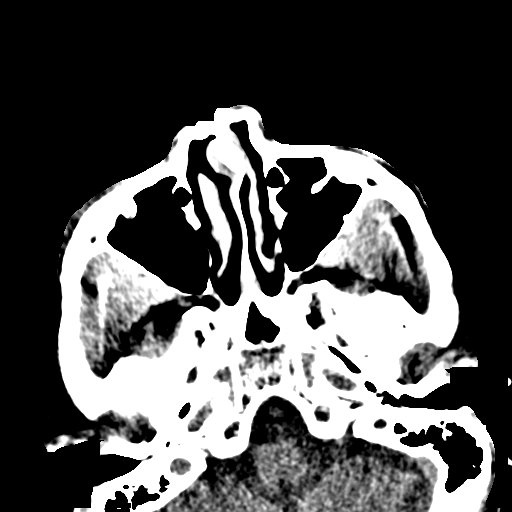
[im 54/86  bone]
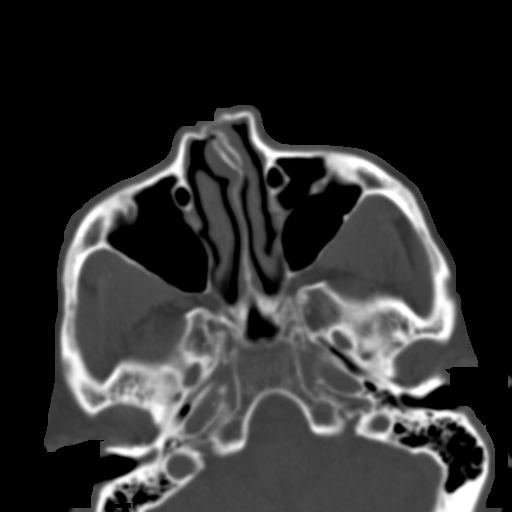
[im 64/86  bone]
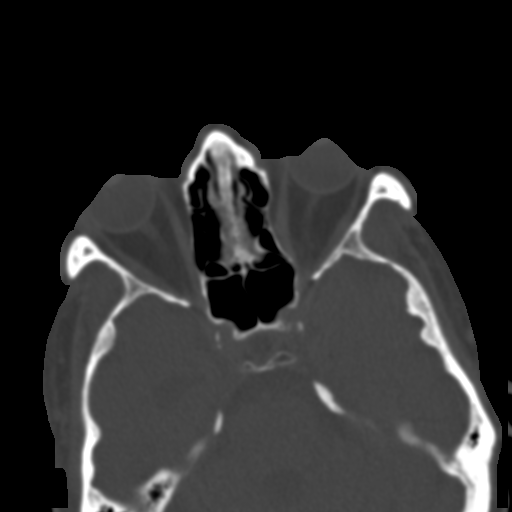
[im 75/86  bone]
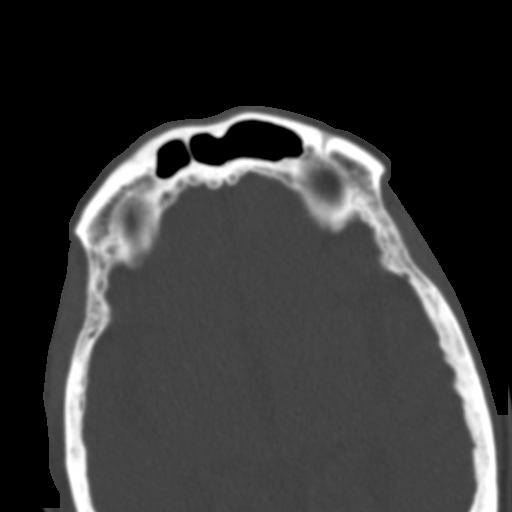

[Series 5: 1.0 thin soft tissue · axial · 0.35mm/px · z∈[-222,-202]mm · 2 of 172 slices shown]
[im 21/172  brain]
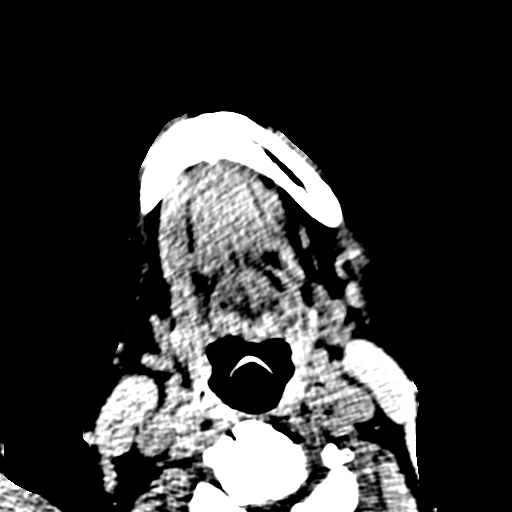
[im 41/172  brain]
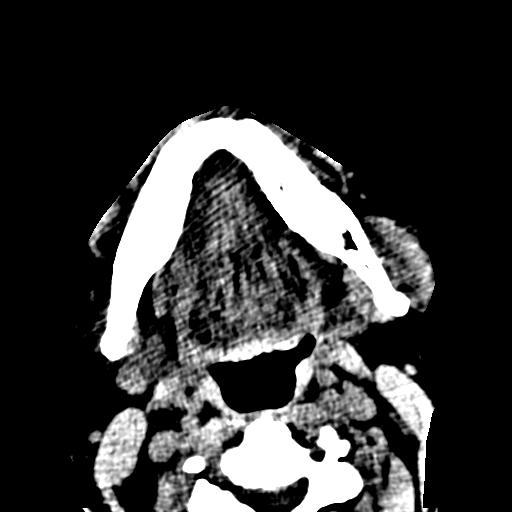

[Series 7: coronal soft tissue · coronal · 0.37mm/px · 3 of 76 slices shown]
[im 26/76  bone]
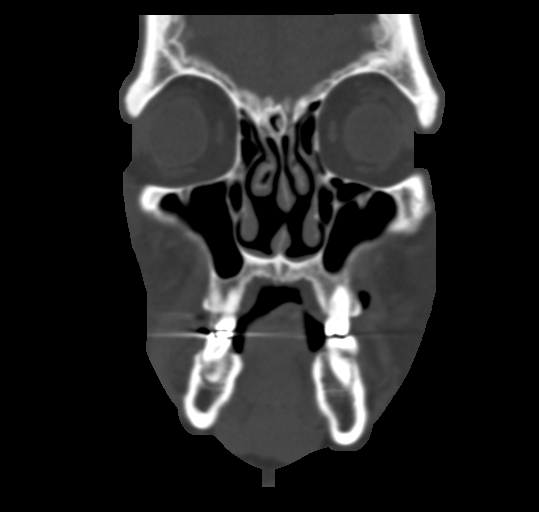
[im 34/76  bone]
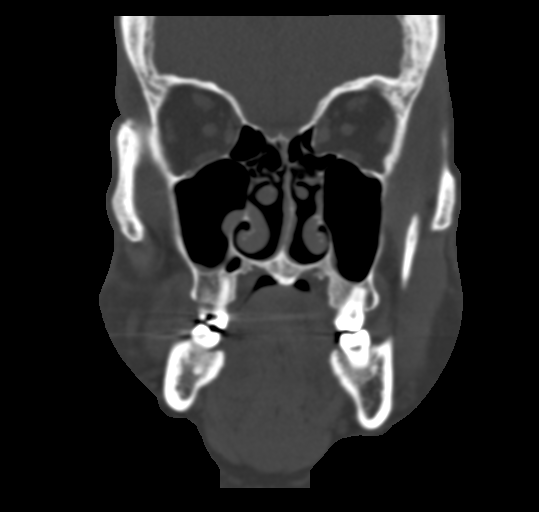
[im 42/76  bone]
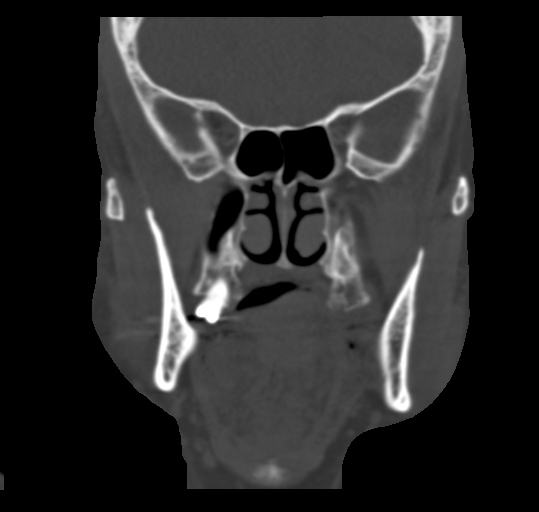

[Series 8: sagittal soft tissue · sagittal · 0.37mm/px · 3 of 88 slices shown]
[im 30/88  bone]
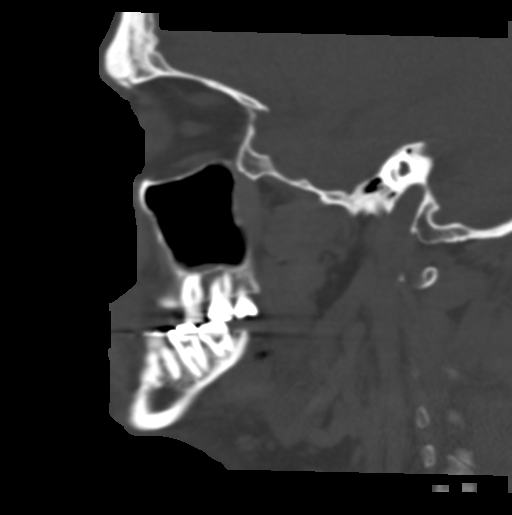
[im 44/88  bone]
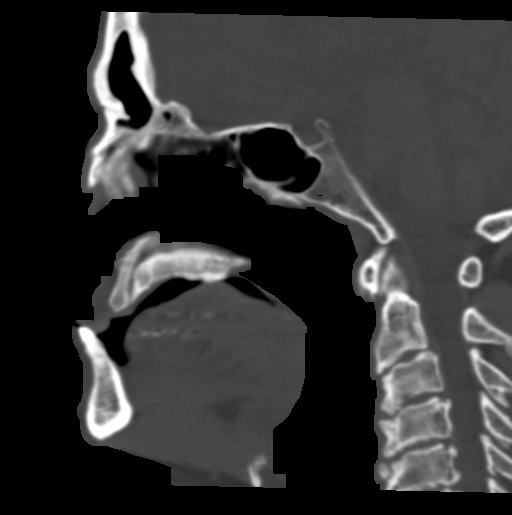
[im 59/88  bone]
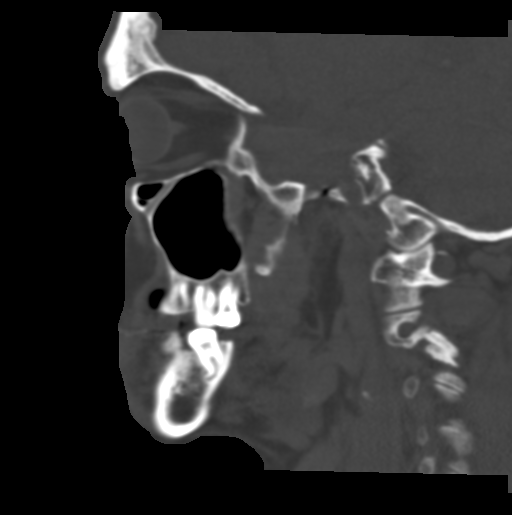

[15 of 47 positions shown; findings below may reference images not displayed]

FINDINGS: CT HEAD FINDINGS

Brain: Normal anatomic configuration. Mild parenchymal volume loss
is present, slightly advanced in relation of the patient's stated
age. No abnormal intra or extra-axial mass lesion or fluid
collection. No abnormal mass effect or midline shift. No evidence of
acute intracranial hemorrhage or infarct. Ventricular size is
normal. Cerebellum unremarkable.

Vascular: Unremarkable

Skull: Intact

Other: Mastoid air cells and middle ear cavities are clear.

CT MAXILLOFACIAL FINDINGS

Osseous: There is no acute facial fracture identified. A remote
fracture of the nasal bones and nasal septum is identified. There is
erosion of the central aspect of the nasal septum.

Orbits: Negative. No traumatic or inflammatory finding.

Sinuses: Clear bilaterally

Soft tissues: There is mild soft tissue swelling superficial to the
right zygomaticomaxillary complex.

CT CERVICAL SPINE FINDINGS

Alignment: There is overall straightening of the cervical spine. No
listhesis.

Skull base and vertebrae: The craniocervical alignment is normal.
The atlantodental interval is not widened. There is no acute
fracture of the cervical spine.

Soft tissues and spinal canal: There is diffuse narrowing of the
spinal canal secondary to congenital shortening of the pedicles.
Superimposed posterior disc osteophyte complex ease at C3-C7 results
in moderate central canal stenosis with flattening of the thecal
sac, most severe at C4-5 with an AP diameter of the spinal canal of
approximately 7 mm. No canal hematoma. No prevertebral soft tissue
swelling. No paraspinal fluid collections are identified.

Disc levels: There is diffuse intervertebral disc space narrowing
and endplate remodeling throughout the cervical spine in keeping
with changes of diffuse moderate to severe degenerative disc
disease. Vertebral body height has been preserved. The prevertebral
soft tissues are not thickened on sagittal reformats. Review of the
axial images demonstrates multilevel uncovertebral arthrosis
resulting in multilevel mild-to-moderate neuroforaminal narrowing,
most severe on the right at C4-5, bilaterally at C5-6, and on the
right at C6-7 and C7-T1.

Upper chest: Unremarkable

Other: None
IMPRESSION: No acute intracranial injury.  No calvarial fracture.

Mild soft tissue swelling superficial to the right
zygomaticomaxillary complex. No acute facial fracture.

Remote fracture of the nasal bones and nasal septum. Superimposed
erosion of the central nasal septum.

No acute fracture or listhesis of the cervical spine.

Multilevel advanced degenerative disc and degenerative joint disease
which, in combination with congenital narrowing of the spinal canal,
results in moderate central canal stenosis at C3-C7 and multilevel
neuroforaminal narrowing, most severe at C4-T1 as described above.

## 2021-09-14 IMAGING — CT CT MAXILLOFACIAL W/O CM
3 series · 15 of 47 positions shown, 18 images · non-contrast
Comparison: 07/30/2021

CLINICAL DATA: Fall without head injury.  History of TBI.

EXAM:
CT HEAD WITHOUT CONTRAST
CT MAXILLOFACIAL WITHOUT CONTRAST
CT CERVICAL SPINE WITHOUT CONTRAST
TECHNIQUE: Multidetector CT imaging of the head, cervical spine, and
maxillofacial structures were performed using the standard protocol
without intravenous contrast. Multiplanar CT image reconstructions
of the cervical spine and maxillofacial structures were also
generated.

[Series 2: max soft · axial · 0.35mm/px · z∈[+1457,+1597]mm · 9 of 82 slices shown, 12 images]
[im 6/82  brain]
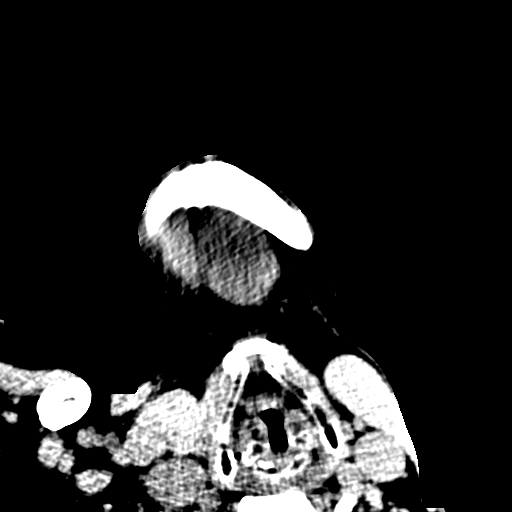
[im 6/82  bone]
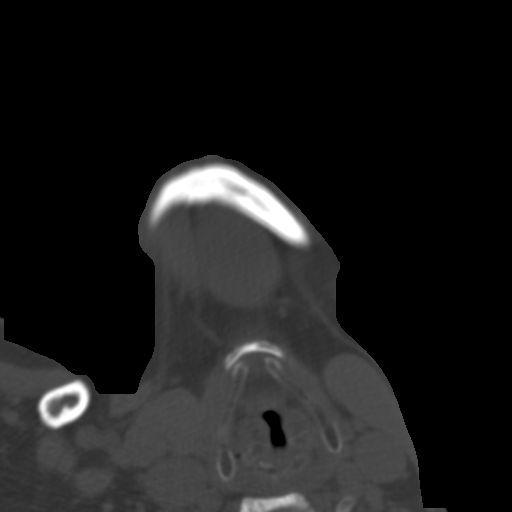
[im 14/82  bone]
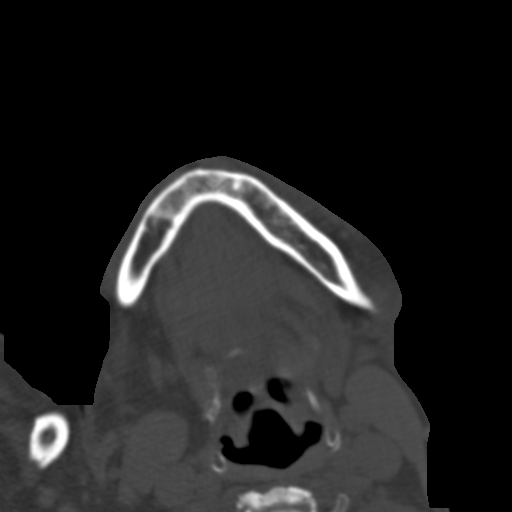
[im 23/82  bone]
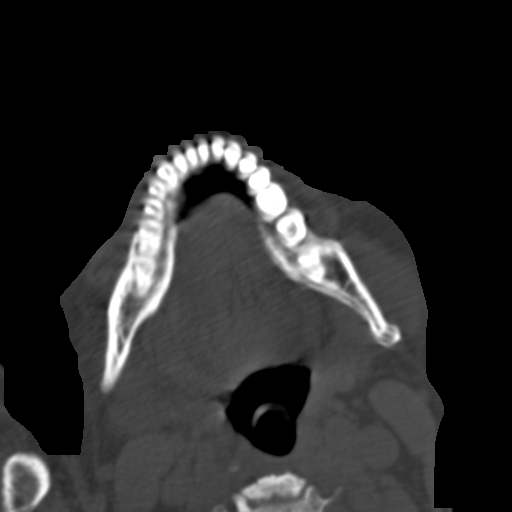
[im 31/82  bone]
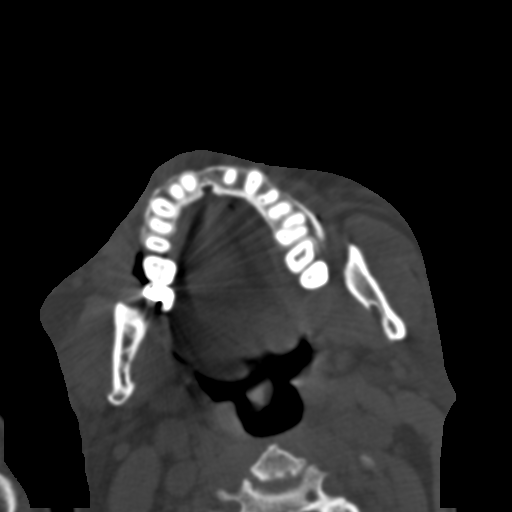
[im 42/82  brain]
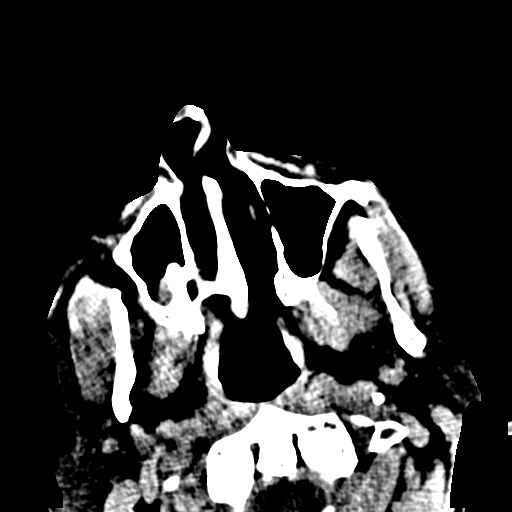
[im 42/82  bone]
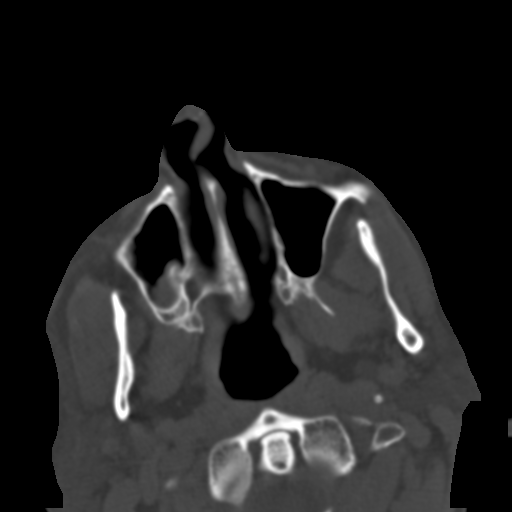
[im 51/82  bone]
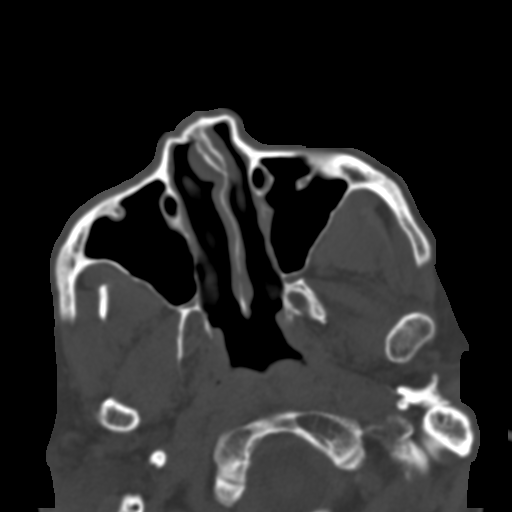
[im 59/82  bone]
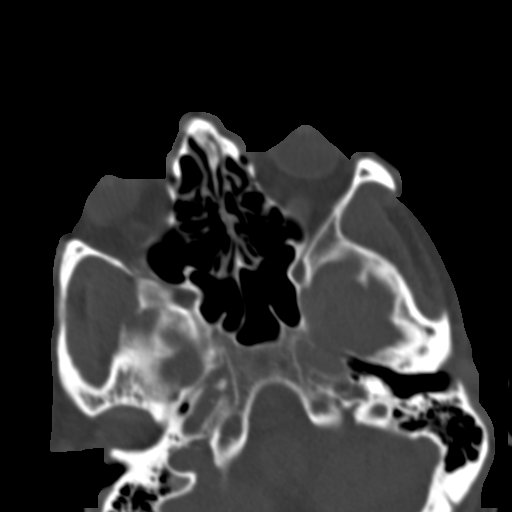
[im 68/82  bone]
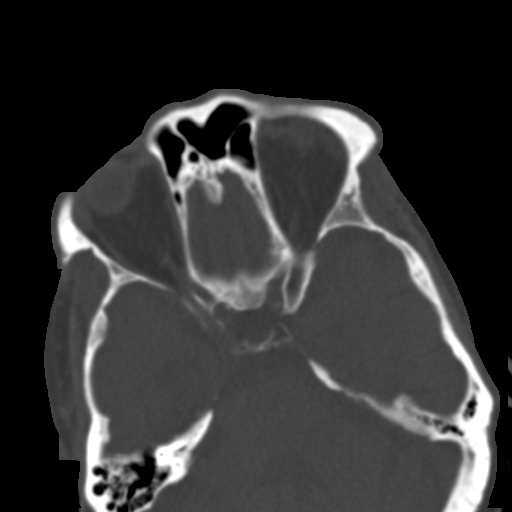
[im 76/82  brain]
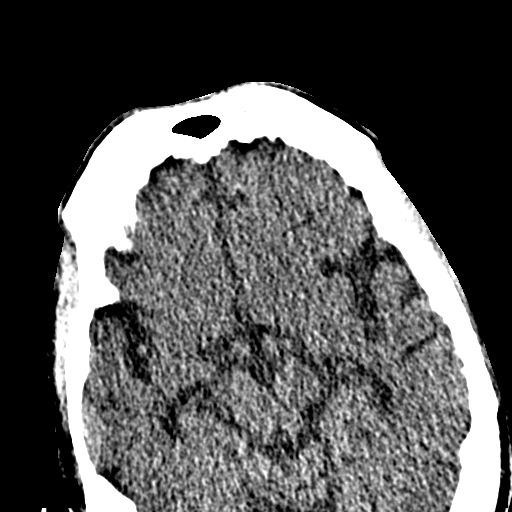
[im 76/82  bone]
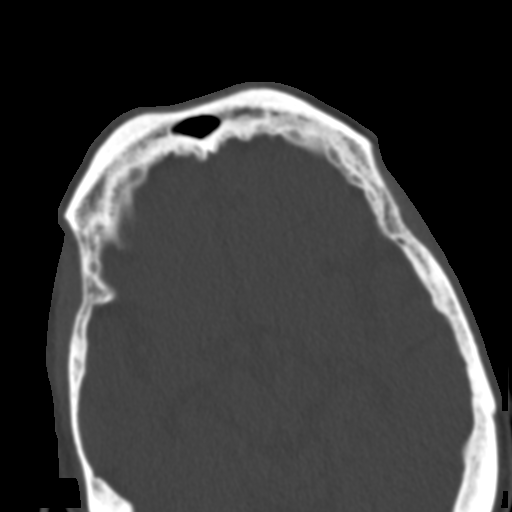

[Series 6: coronal soft · coronal · 0.39mm/px · 3 of 73 slices shown]
[im 25/73  bone]
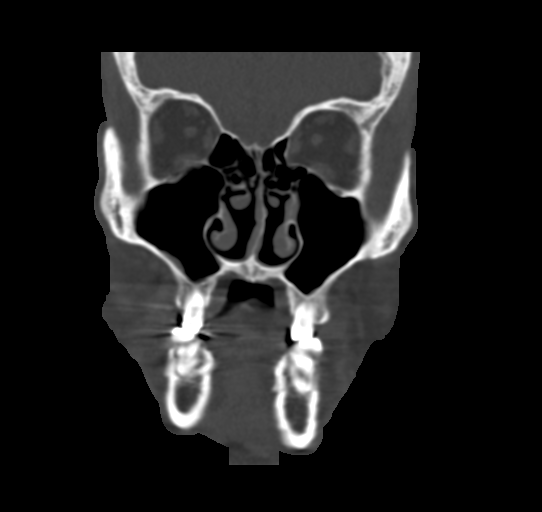
[im 33/73  bone]
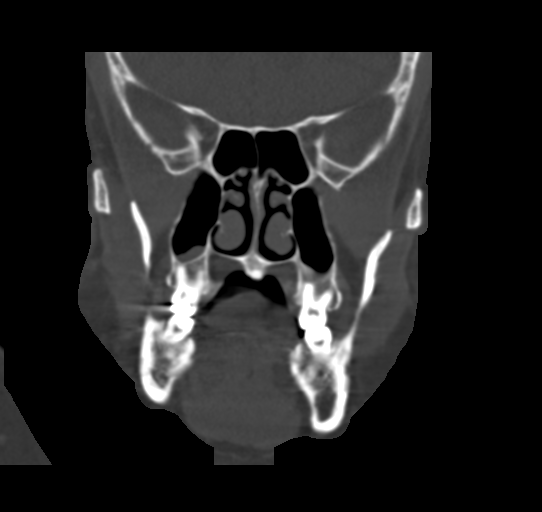
[im 41/73  bone]
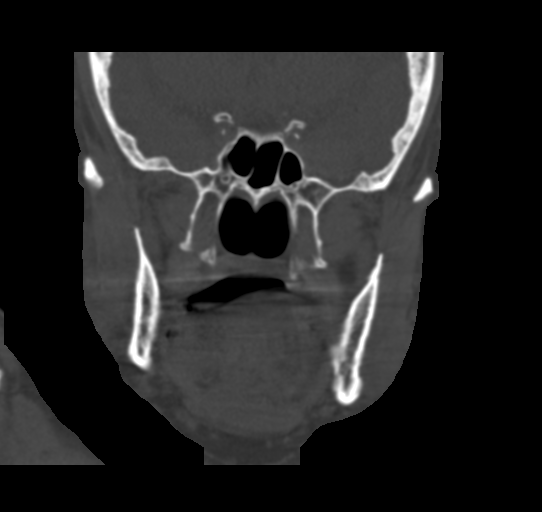

[Series 7: sagittal soft · sagittal · 0.38mm/px · 3 of 84 slices shown]
[im 28/84  bone]
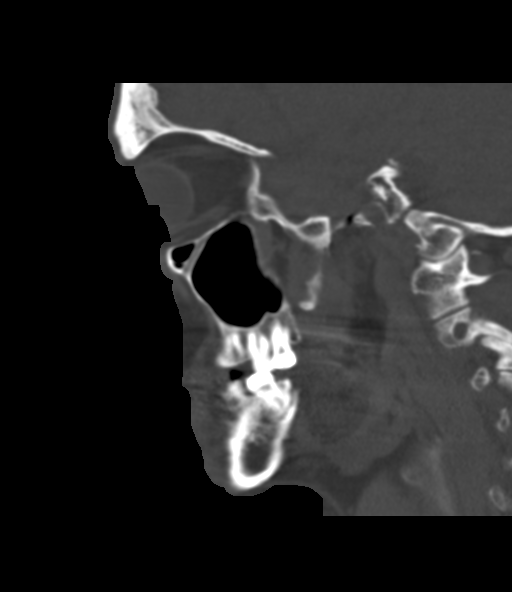
[im 42/84  bone]
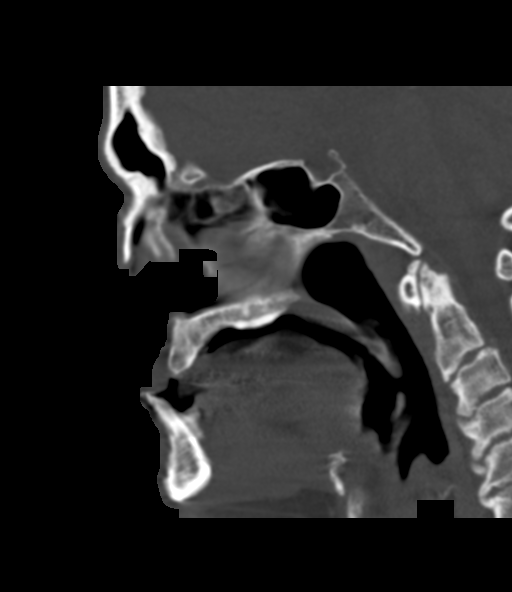
[im 56/84  bone]
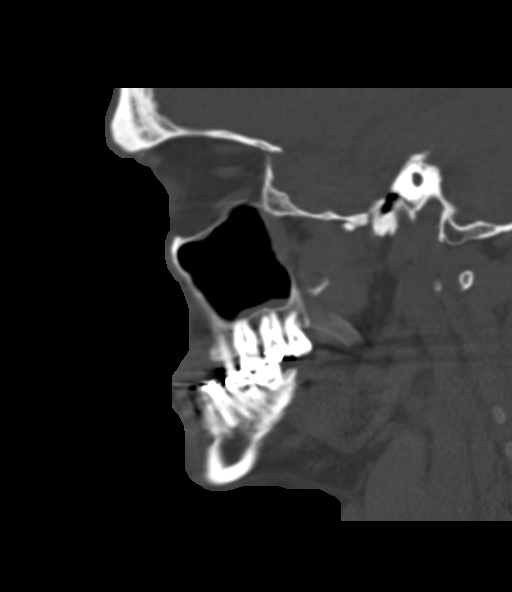

[15 of 47 positions shown; findings below may reference images not displayed]

FINDINGS: CT HEAD FINDINGS

Brain: No evidence of acute infarction, hemorrhage, hydrocephalus,
extra-axial collection or mass lesion/mass effect. Premature atrophy

Vascular: No hyperdense vessel or unexpected calcification.

Skull: Normal. Negative for fracture or focal lesion.

CT MAXILLOFACIAL FINDINGS

Osseous: No acute fracture. No destructive process. Remote bilateral
nasal arch fractures and nasal septal fractures with depression.
Remote medial left orbit fracture.

Orbits: No evidence of acute injury.

Sinuses: Clear

Soft tissues: No hematoma or foreign body.

CT CERVICAL SPINE FINDINGS

Alignment: Degenerative reversal of cervical or lordosis with mild
C2-3 anterolisthesis

Skull base and vertebrae: No acute fracture

Soft tissues and spinal canal: No prevertebral fluid or swelling. No
visible canal hematoma.

Disc levels: Generalized degenerative disc collapse and
endplate/facet spurring.

Upper chest: Negative
IMPRESSION: No evidence of acute intracranial injury. Negative for facial or
cervical spine fracture.

## 2021-09-14 IMAGING — CT CT HEAD W/O CM
3 series · 14 of 47 positions shown, 16 images · non-contrast
Comparison: 07/30/2021

CLINICAL DATA: Fall without head injury.  History of TBI.

EXAM:
CT HEAD WITHOUT CONTRAST
CT MAXILLOFACIAL WITHOUT CONTRAST
CT CERVICAL SPINE WITHOUT CONTRAST
TECHNIQUE: Multidetector CT imaging of the head, cervical spine, and
maxillofacial structures were performed using the standard protocol
without intravenous contrast. Multiplanar CT image reconstructions
of the cervical spine and maxillofacial structures were also
generated.

[Series 2: head wo · axial · 0.49mm/px · z∈[+1580,+1710]mm · 8 of 32 slices shown, 10 images]
[im 3/32  brain]
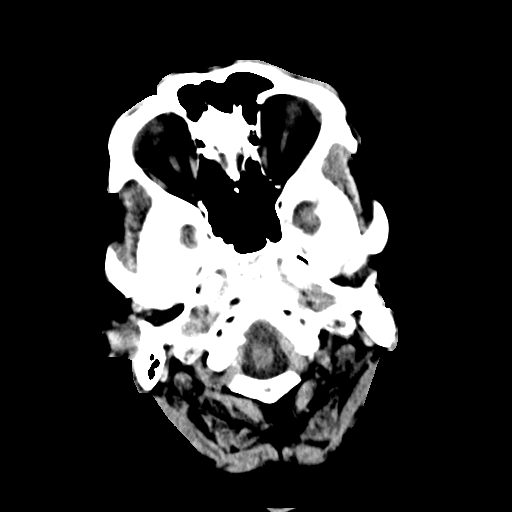
[im 3/32  bone]
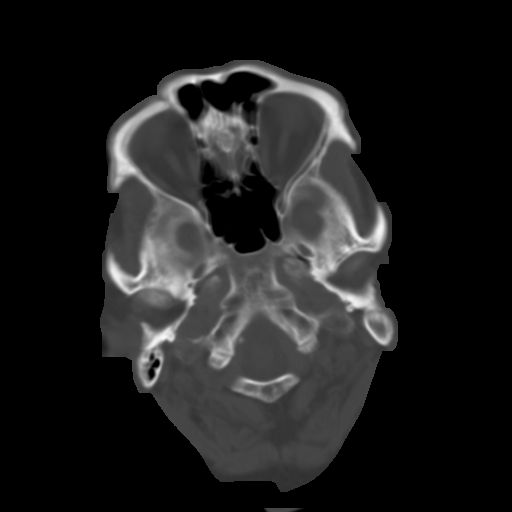
[im 7/32  brain]
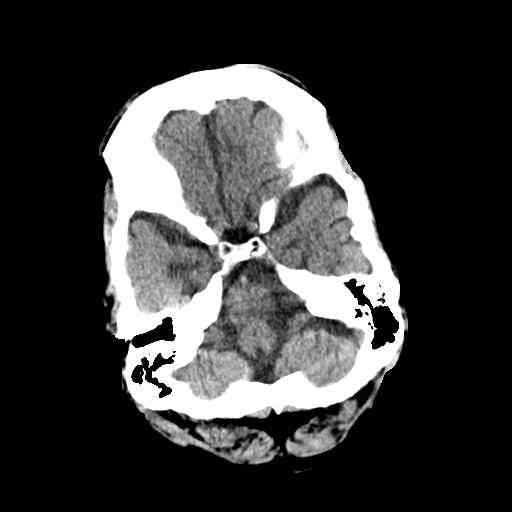
[im 10/32  brain]
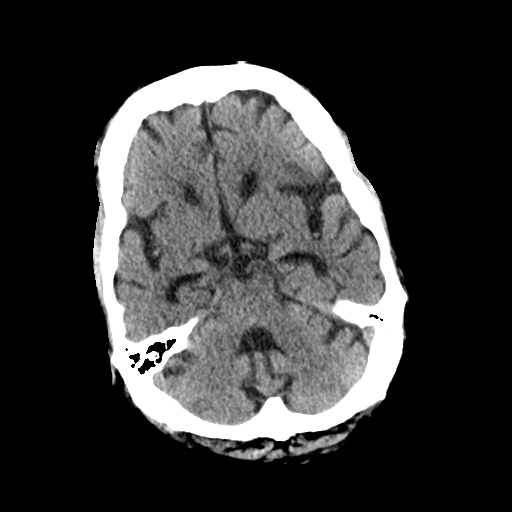
[im 14/32  brain]
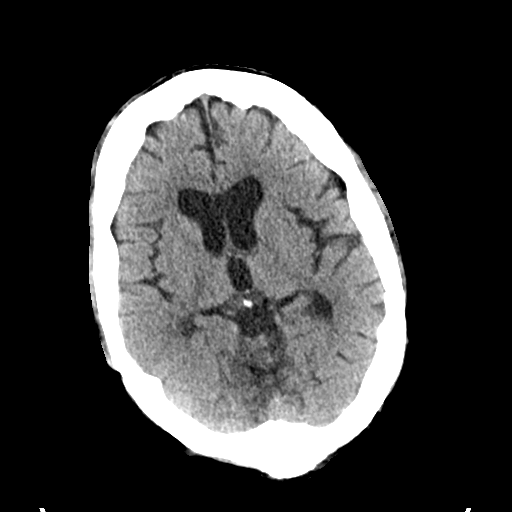
[im 18/32  brain]
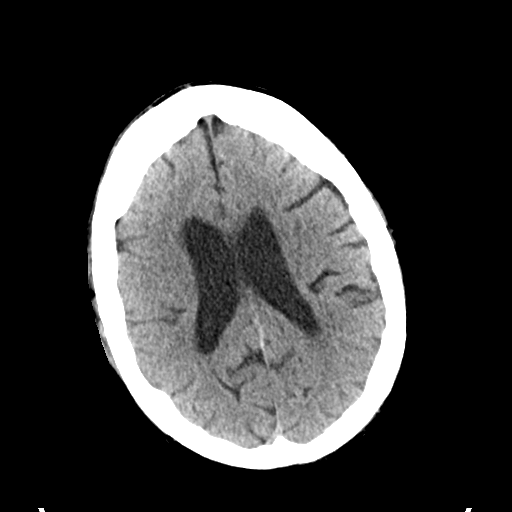
[im 18/32  bone]
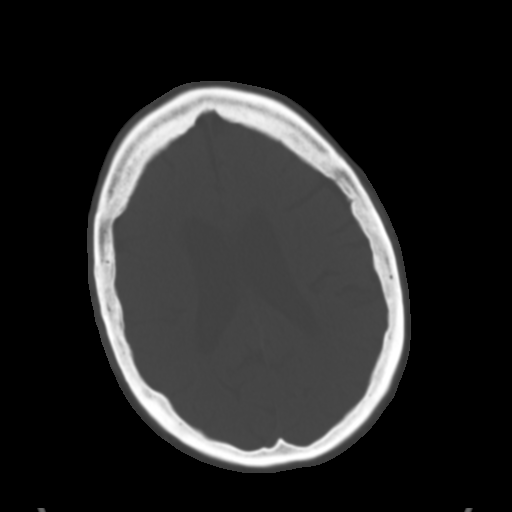
[im 22/32  brain]
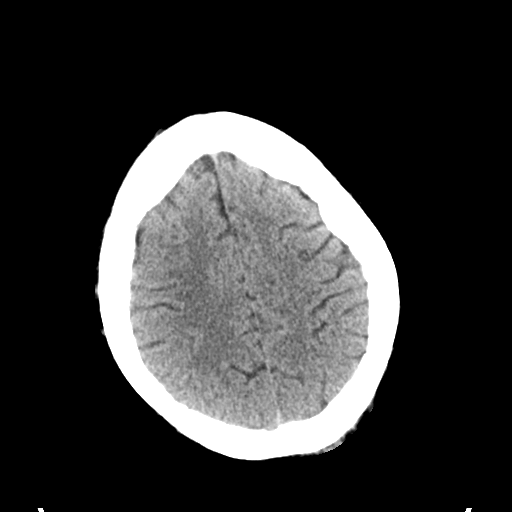
[im 25/32  brain]
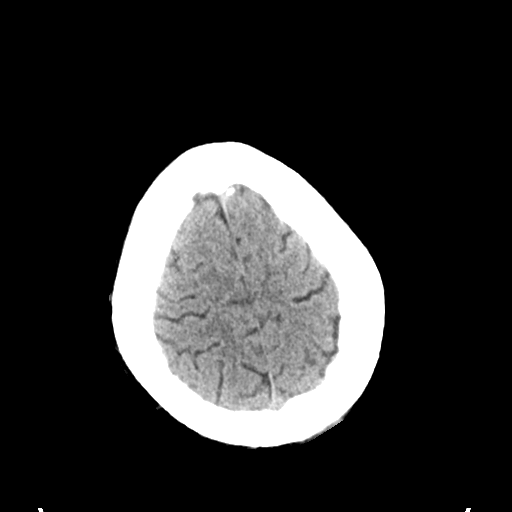
[im 29/32  brain]
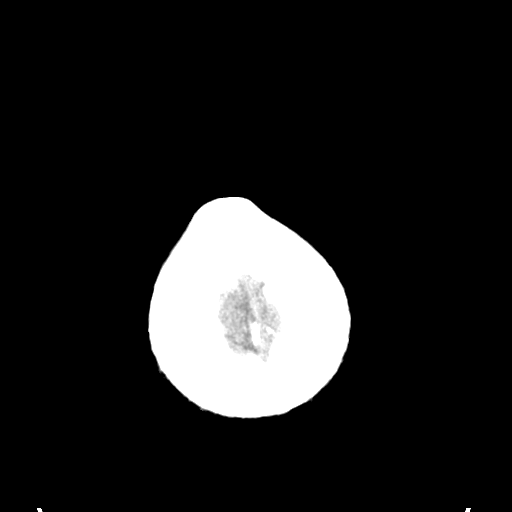

[Series 4: coronal soft tissue · coronal · 0.35mm/px · 3 of 71 slices shown]
[im 24/71  brain]
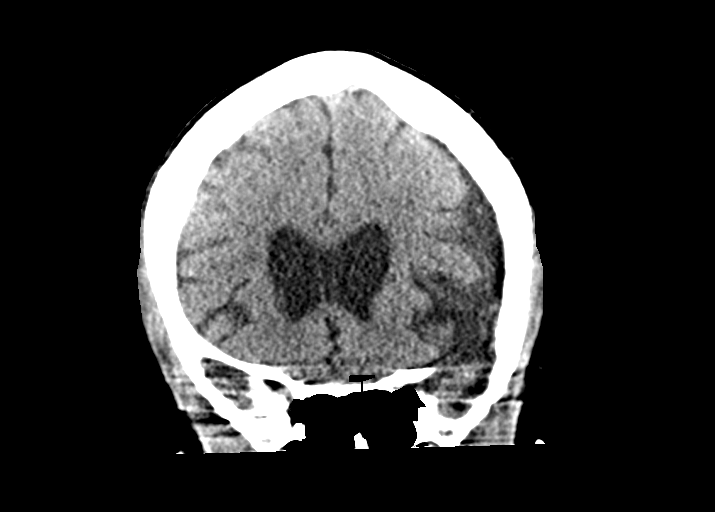
[im 32/71  brain]
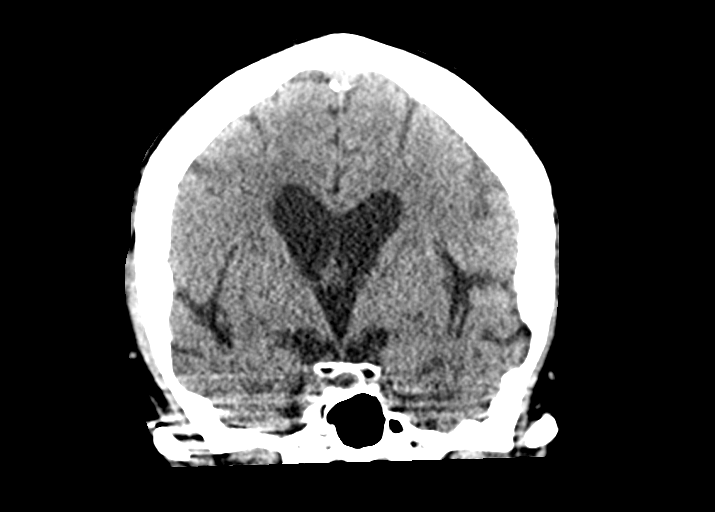
[im 39/71  brain]
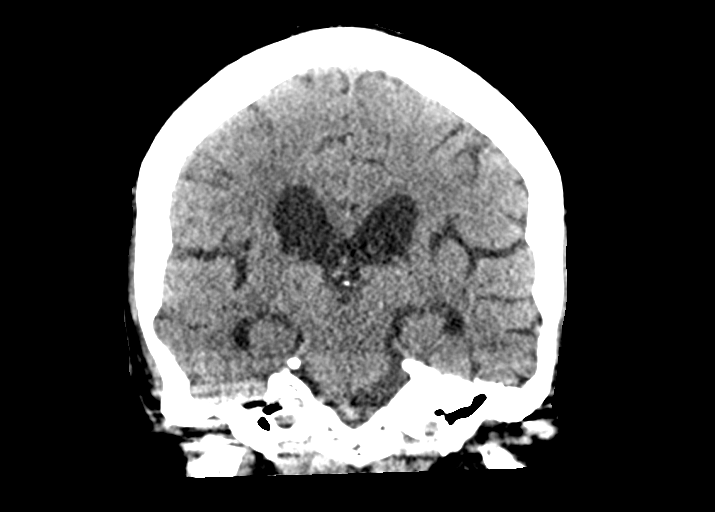

[Series 5: sagittal soft tissue · sagittal · 0.40mm/px · 3 of 54 slices shown]
[im 18/54  brain]
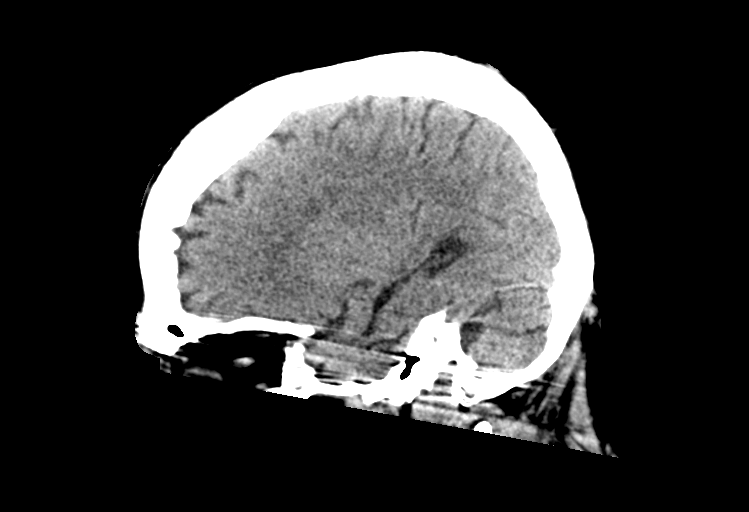
[im 27/54  brain]
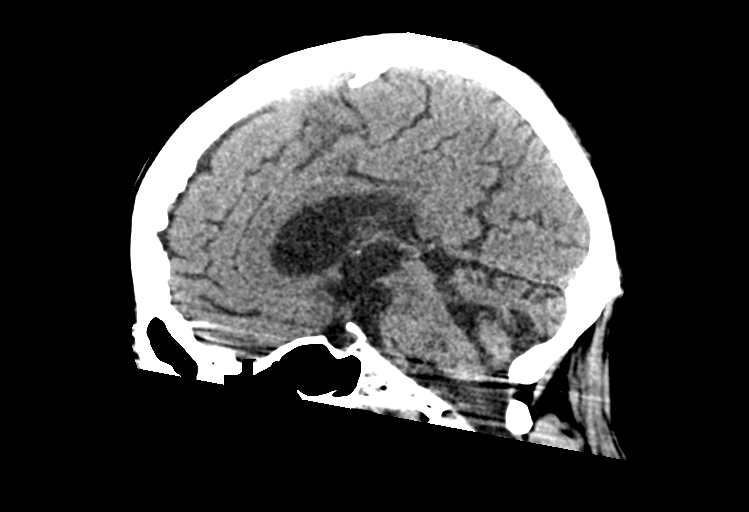
[im 36/54  brain]
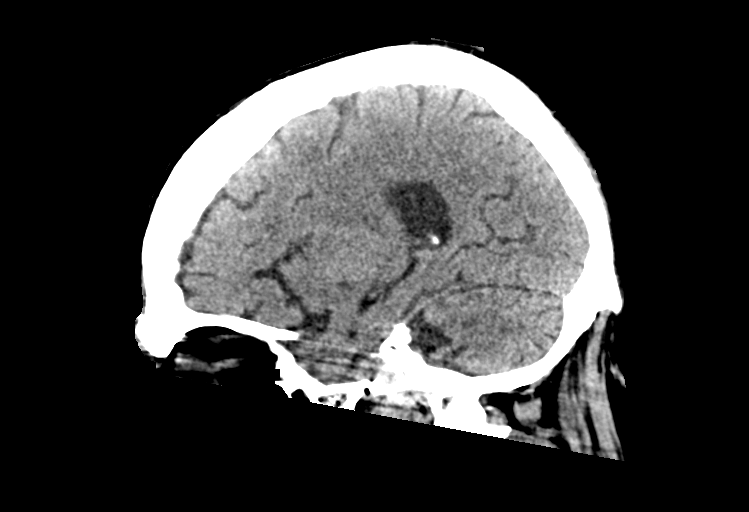

[14 of 47 positions shown; findings below may reference images not displayed]

FINDINGS: CT HEAD FINDINGS

Brain: No evidence of acute infarction, hemorrhage, hydrocephalus,
extra-axial collection or mass lesion/mass effect. Premature atrophy

Vascular: No hyperdense vessel or unexpected calcification.

Skull: Normal. Negative for fracture or focal lesion.

CT MAXILLOFACIAL FINDINGS

Osseous: No acute fracture. No destructive process. Remote bilateral
nasal arch fractures and nasal septal fractures with depression.
Remote medial left orbit fracture.

Orbits: No evidence of acute injury.

Sinuses: Clear

Soft tissues: No hematoma or foreign body.

CT CERVICAL SPINE FINDINGS

Alignment: Degenerative reversal of cervical or lordosis with mild
C2-3 anterolisthesis

Skull base and vertebrae: No acute fracture

Soft tissues and spinal canal: No prevertebral fluid or swelling. No
visible canal hematoma.

Disc levels: Generalized degenerative disc collapse and
endplate/facet spurring.

Upper chest: Negative
IMPRESSION: No evidence of acute intracranial injury. Negative for facial or
cervical spine fracture.

## 2021-09-14 IMAGING — CT CT CERVICAL SPINE W/O CM
3 of 4 series · 14 of 33 positions shown, 17 images · non-contrast
Comparison: 07/30/2021

CLINICAL DATA: Fall without head injury.  History of TBI.

EXAM:
CT HEAD WITHOUT CONTRAST
CT MAXILLOFACIAL WITHOUT CONTRAST
CT CERVICAL SPINE WITHOUT CONTRAST
TECHNIQUE: Multidetector CT imaging of the head, cervical spine, and
maxillofacial structures were performed using the standard protocol
without intravenous contrast. Multiplanar CT image reconstructions
of the cervical spine and maxillofacial structures were also
generated.

[Series 4: orthogonal axials · axial · 0.23mm/px · z∈[+1386,+1536]mm · 6 of 117 slices shown, 8 images]
[im 17/117  soft-tissue]
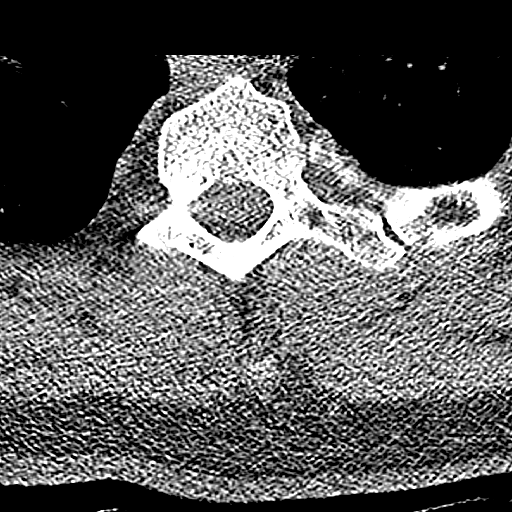
[im 17/117  bone]
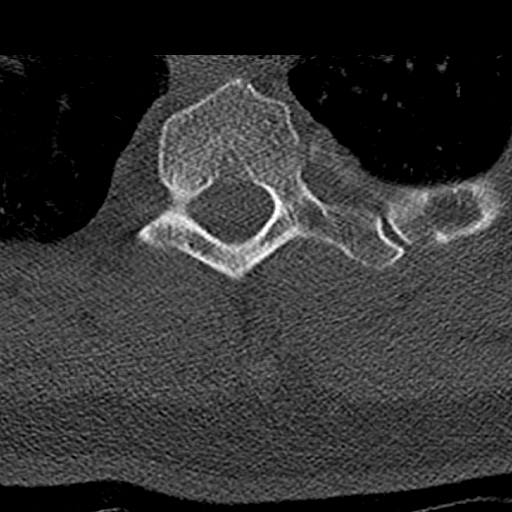
[im 34/117  bone]
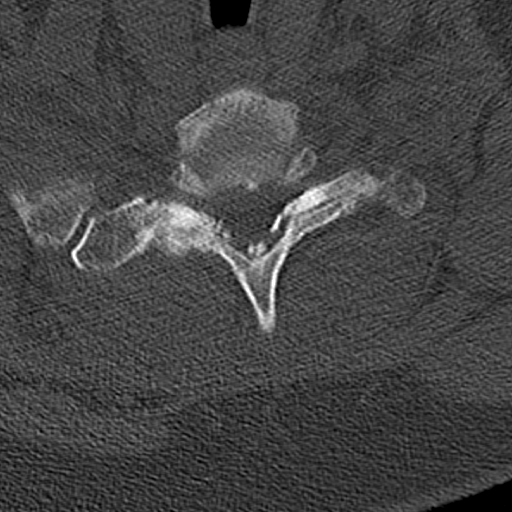
[im 50/117  bone]
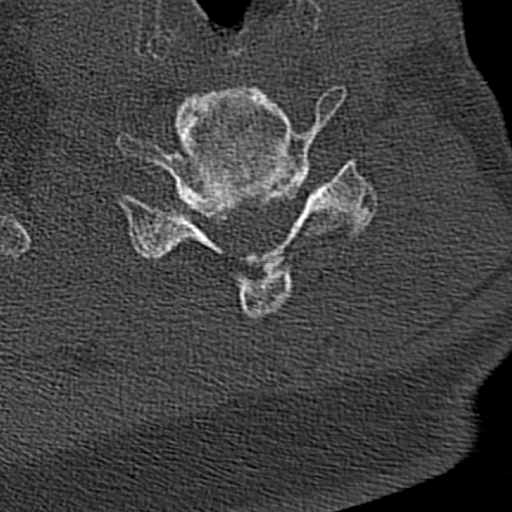
[im 67/117  bone]
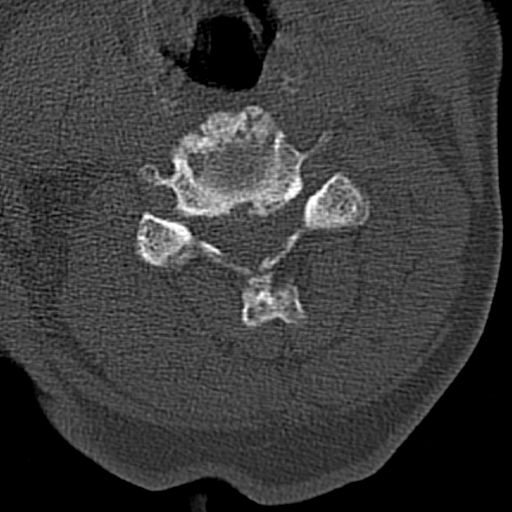
[im 83/117  soft-tissue]
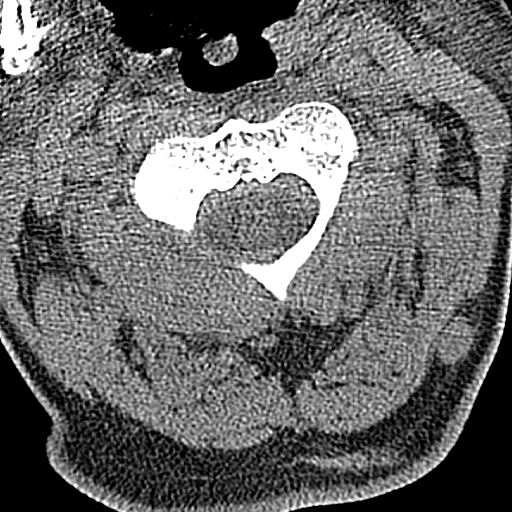
[im 83/117  bone]
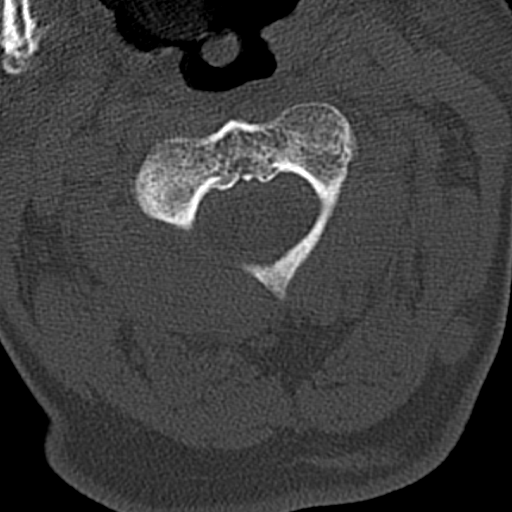
[im 100/117  bone]
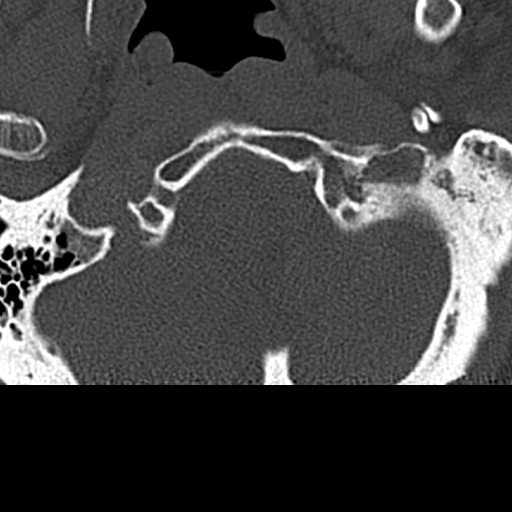

[Series 5: coronal bone · coronal · 0.35mm/px · 3 of 55 slices shown]
[im 11/55  bone]
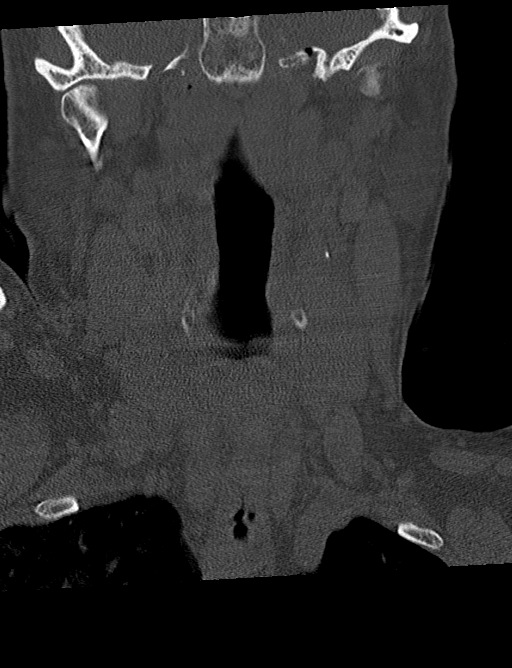
[im 22/55  bone]
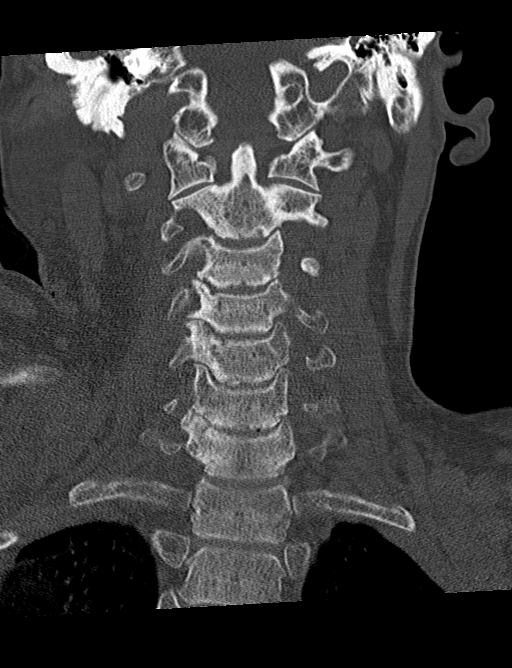
[im 33/55  bone]
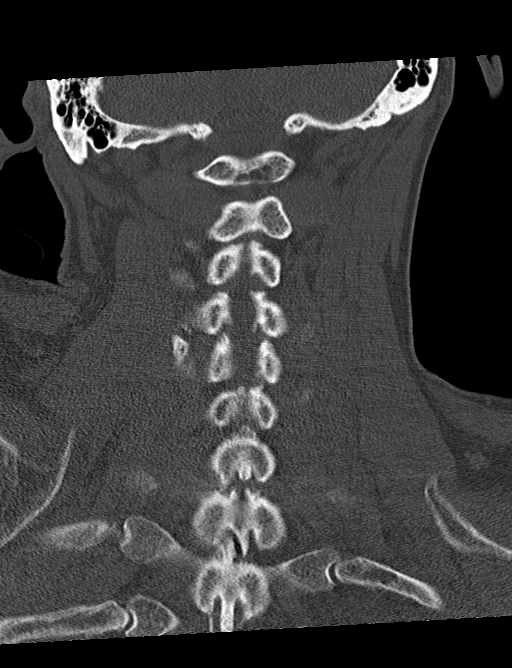

[Series 6: sagittal bone · sagittal · 0.35mm/px · 5 of 48 slices shown, 6 images]
[im 16/48  bone]
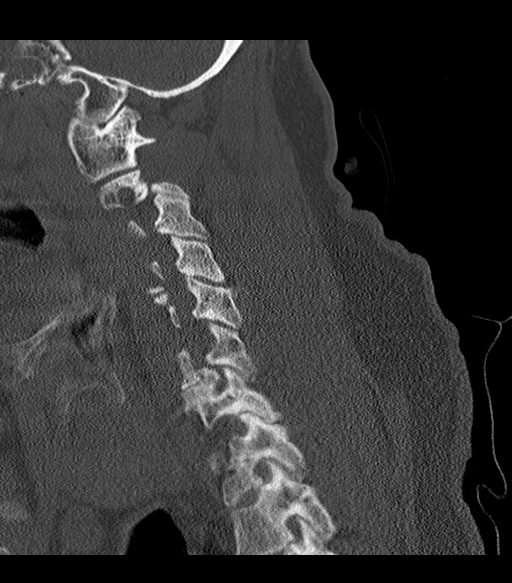
[im 20/48  bone]
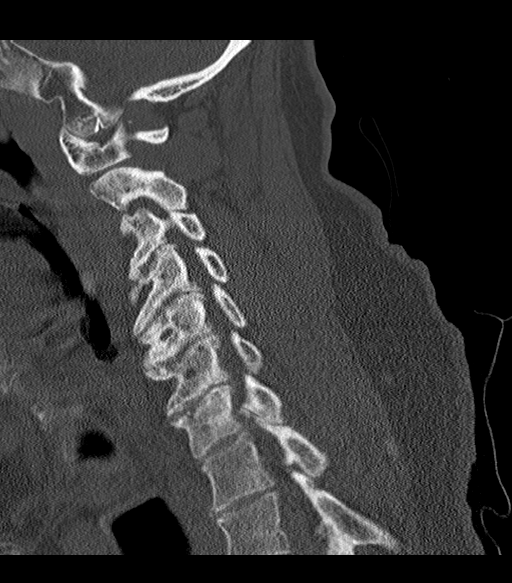
[im 24/48  soft-tissue]
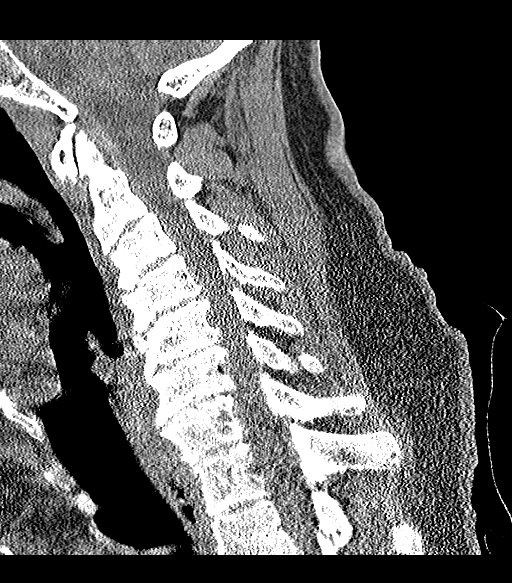
[im 24/48  bone]
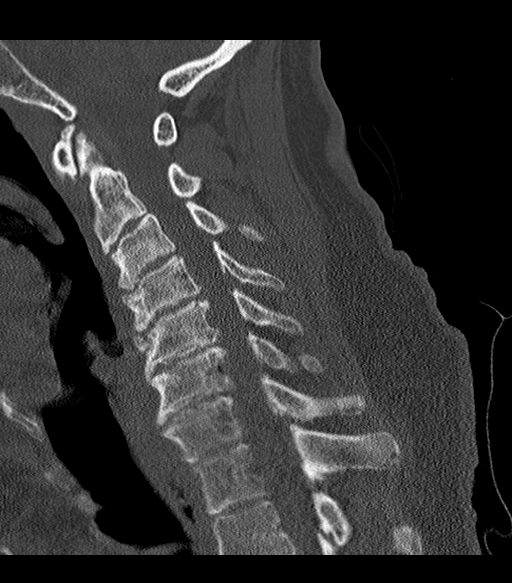
[im 28/48  bone]
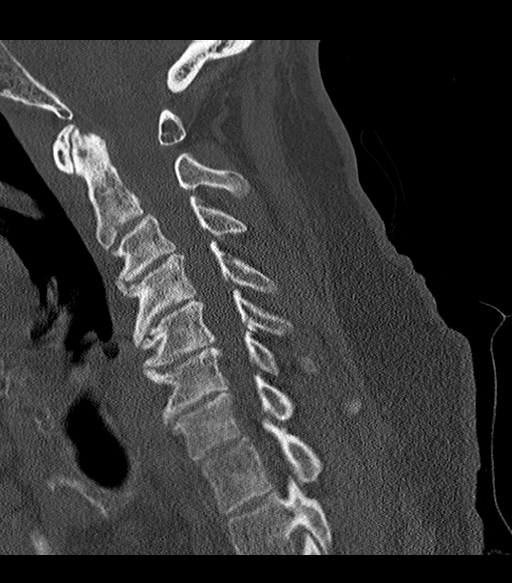
[im 32/48  bone]
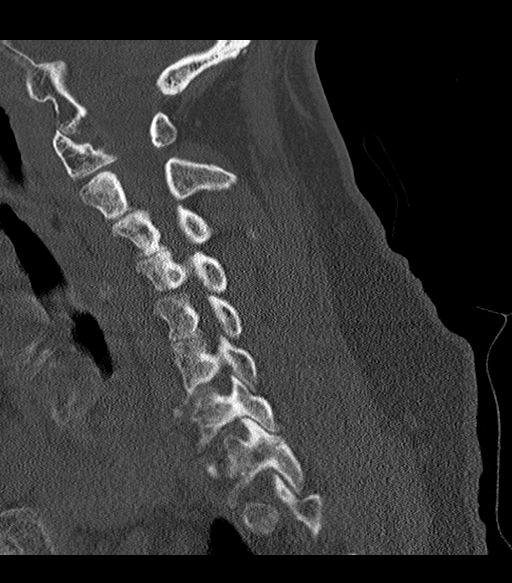

[14 of 33 positions shown; findings below may reference images not displayed]

FINDINGS: CT HEAD FINDINGS

Brain: No evidence of acute infarction, hemorrhage, hydrocephalus,
extra-axial collection or mass lesion/mass effect. Premature atrophy

Vascular: No hyperdense vessel or unexpected calcification.

Skull: Normal. Negative for fracture or focal lesion.

CT MAXILLOFACIAL FINDINGS

Osseous: No acute fracture. No destructive process. Remote bilateral
nasal arch fractures and nasal septal fractures with depression.
Remote medial left orbit fracture.

Orbits: No evidence of acute injury.

Sinuses: Clear

Soft tissues: No hematoma or foreign body.

CT CERVICAL SPINE FINDINGS

Alignment: Degenerative reversal of cervical or lordosis with mild
C2-3 anterolisthesis

Skull base and vertebrae: No acute fracture

Soft tissues and spinal canal: No prevertebral fluid or swelling. No
visible canal hematoma.

Disc levels: Generalized degenerative disc collapse and
endplate/facet spurring.

Upper chest: Negative
IMPRESSION: No evidence of acute intracranial injury. Negative for facial or
cervical spine fracture.

## 2021-09-14 IMAGING — DX DG HAND COMPLETE 3+V*L*
3 series · 3 of 3 positions shown · non-contrast
Comparison: July 27, 2021.

CLINICAL DATA: Left hand pain after fall.

EXAM:
LEFT HAND - COMPLETE 3+ VIEW

[hand ap]
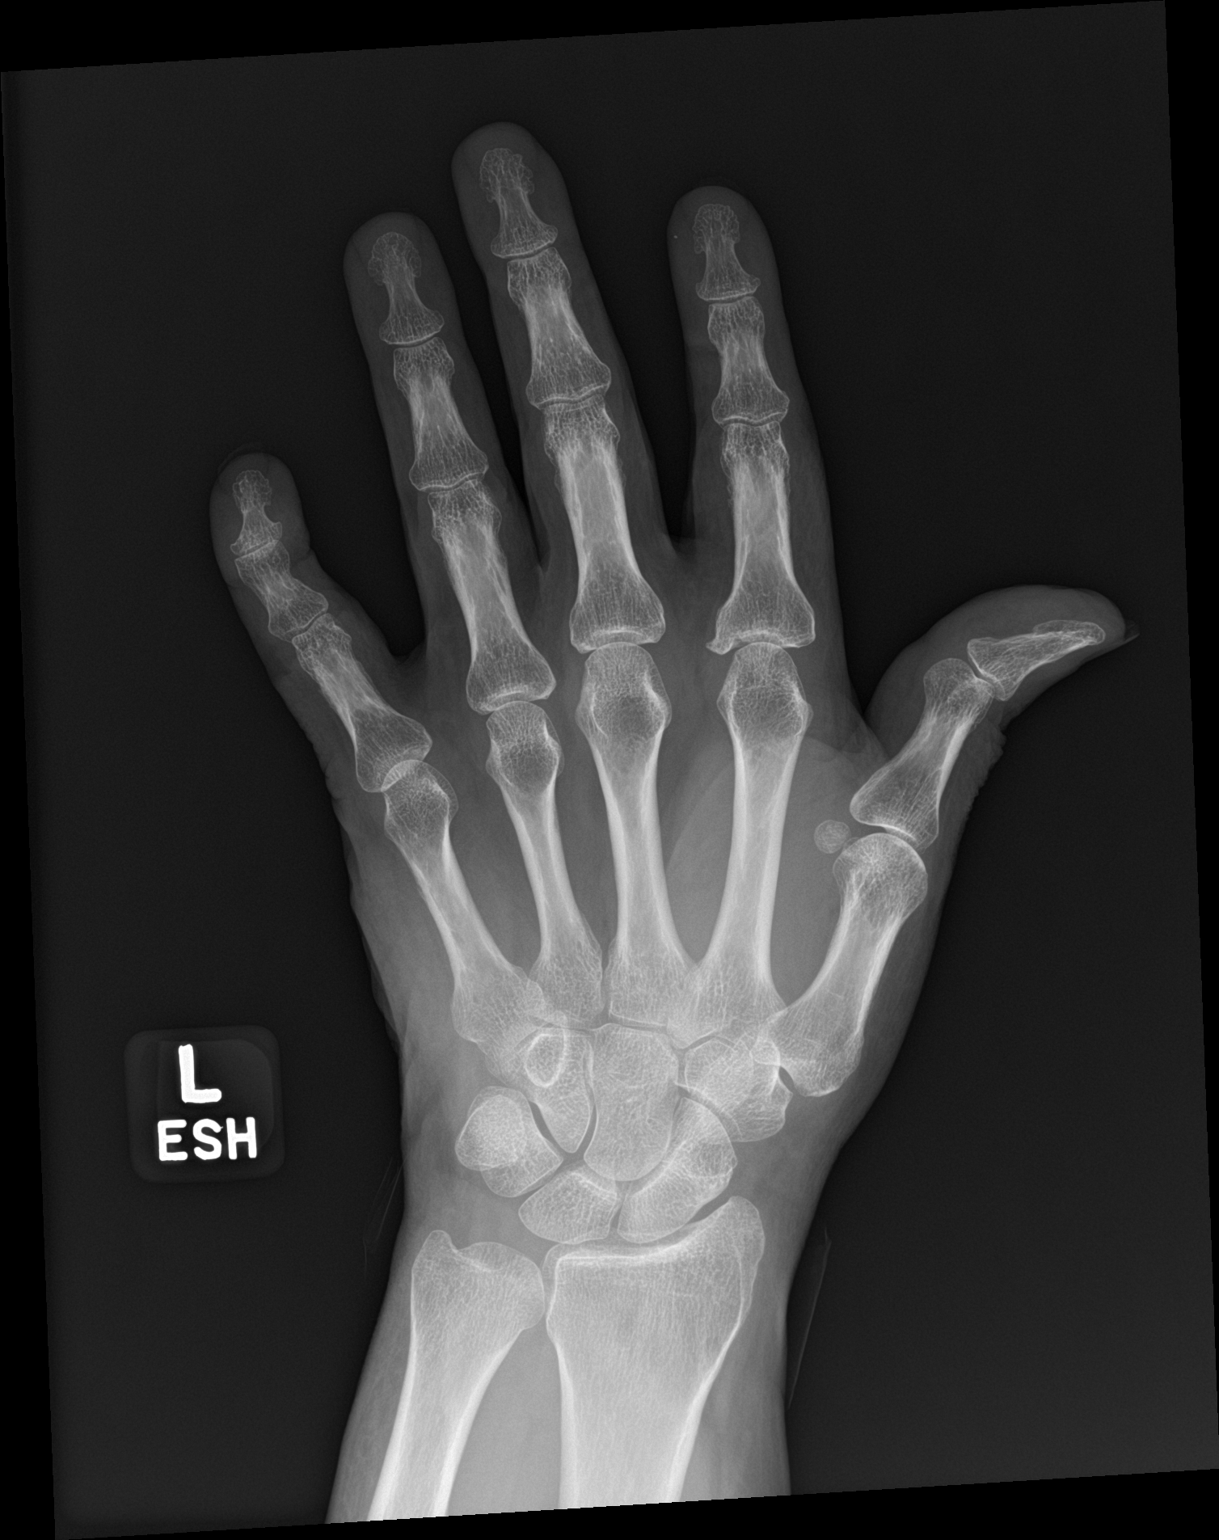

[hand obl]
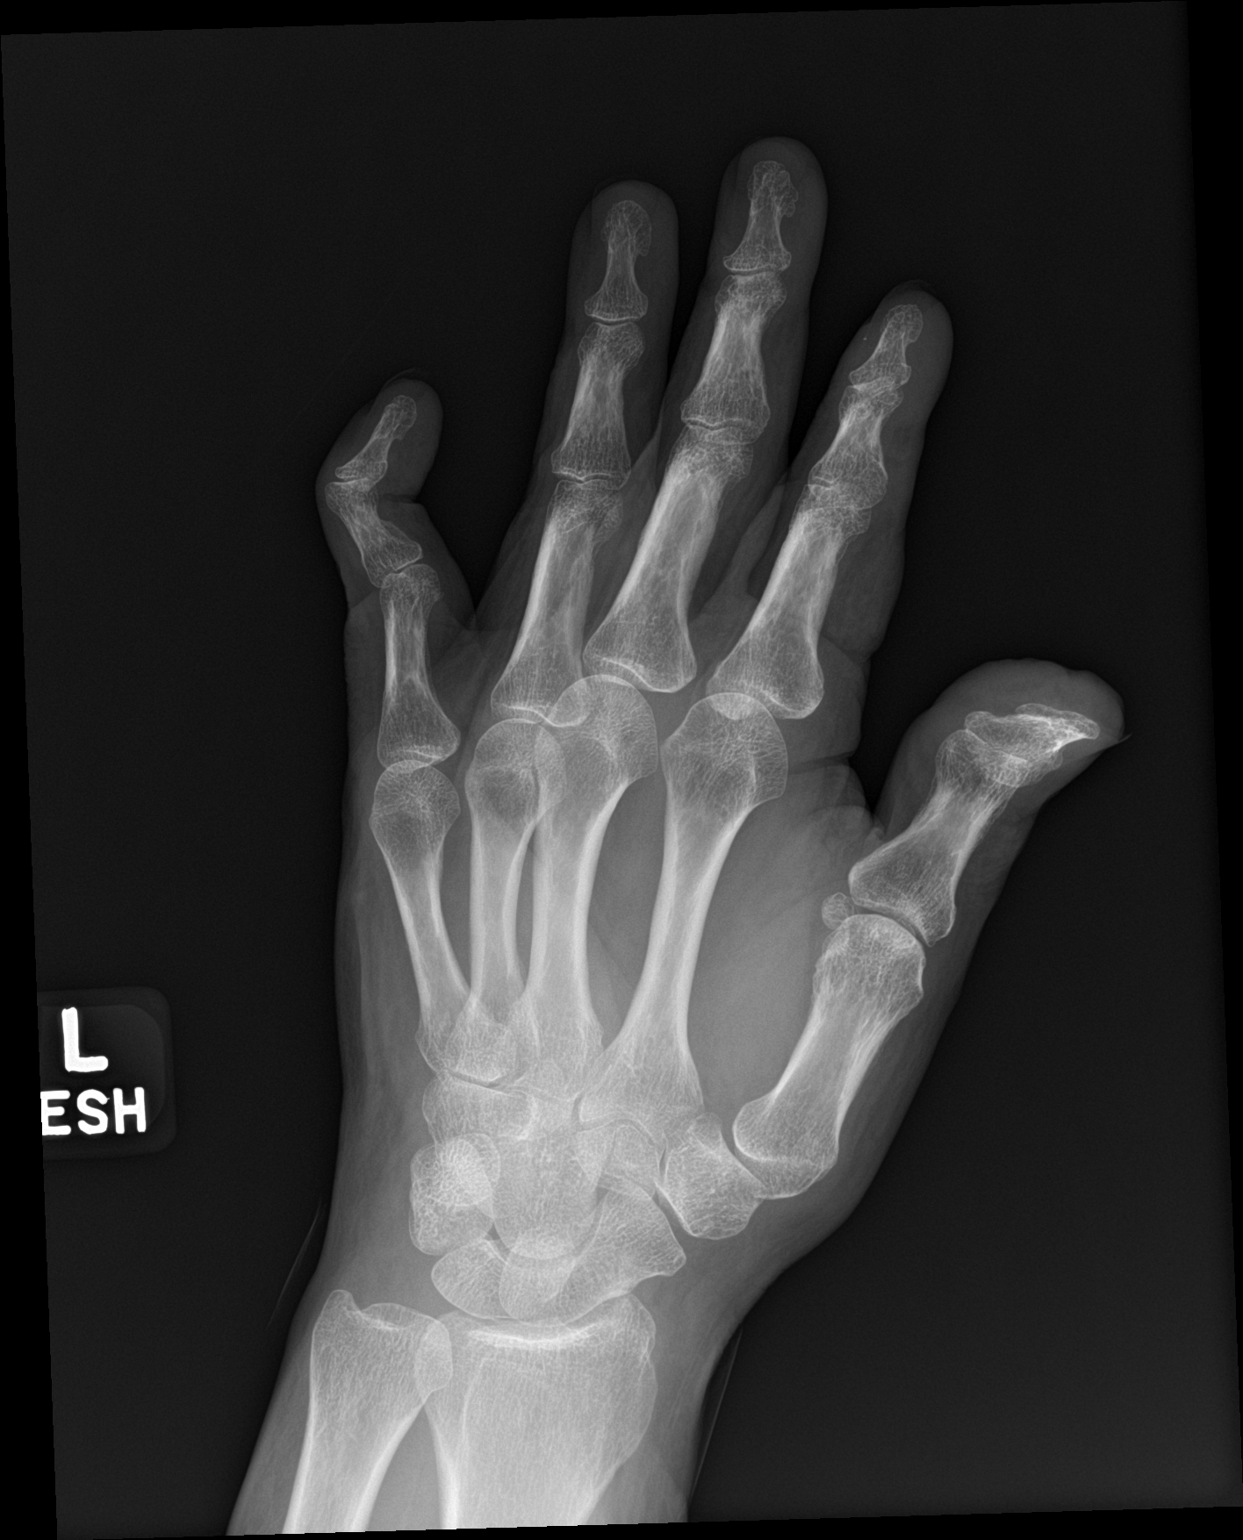

[hand lat]
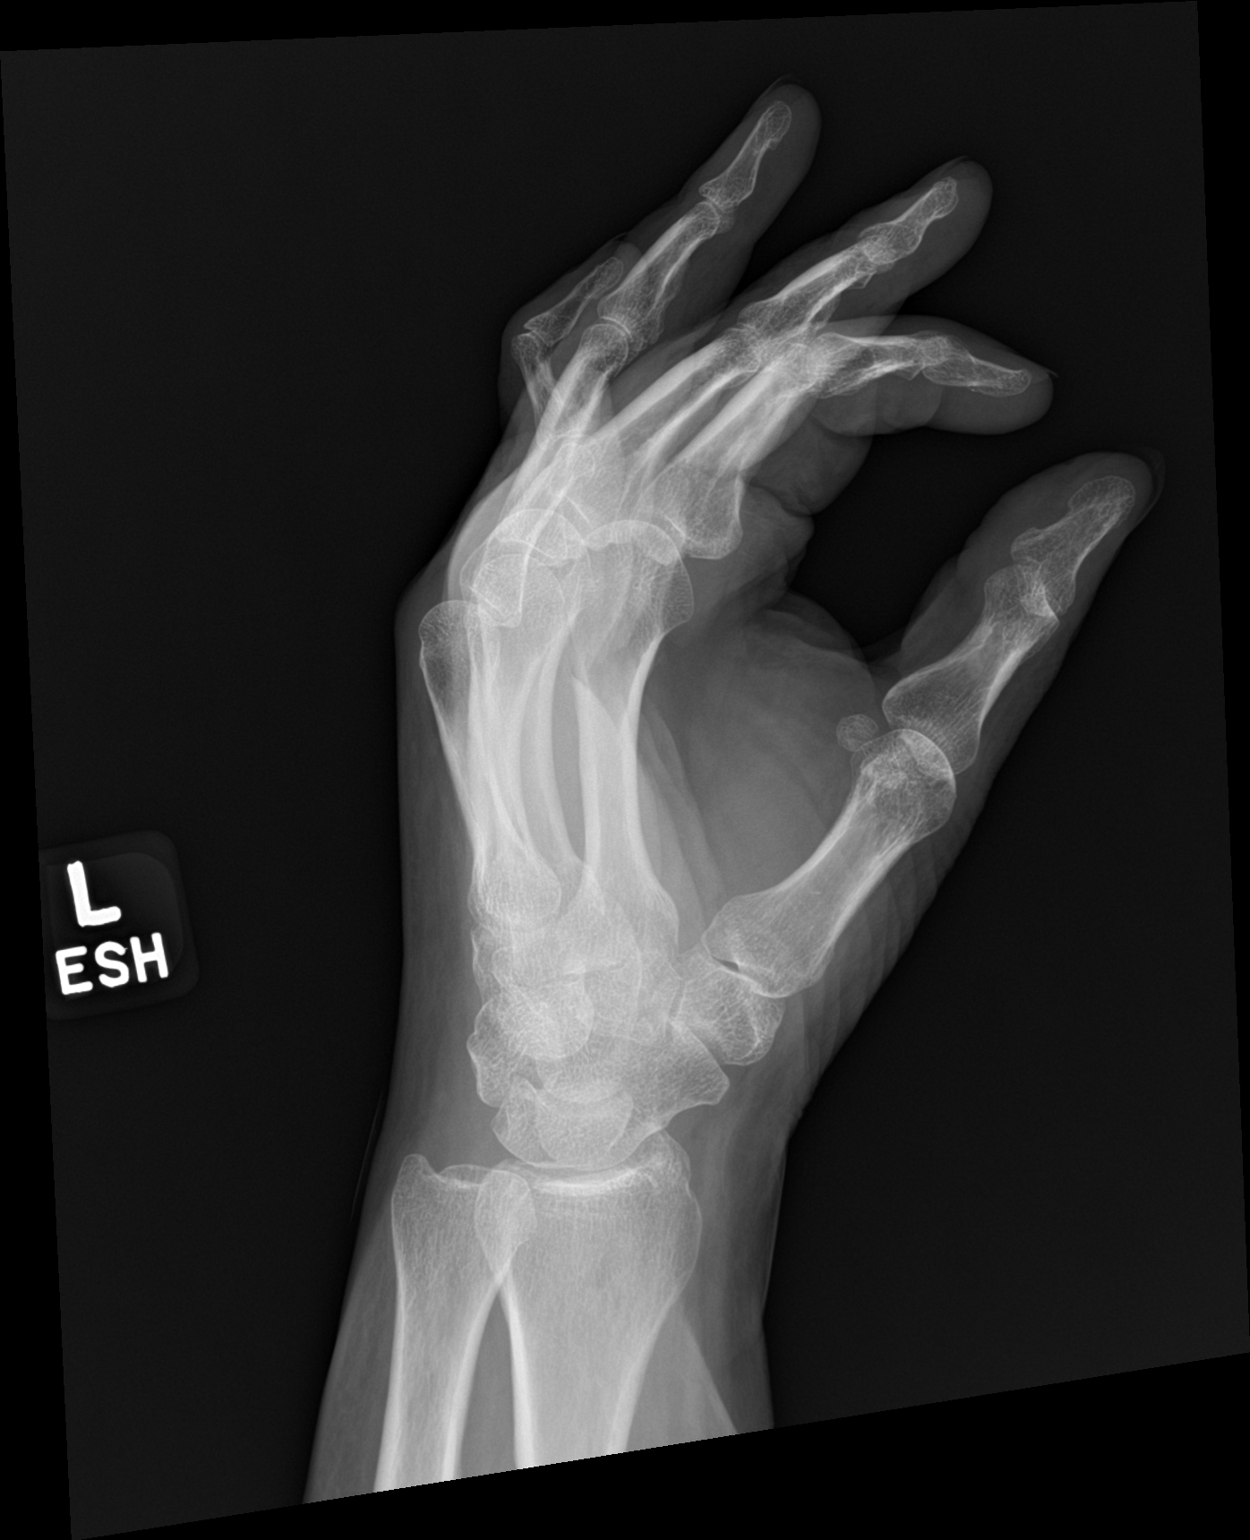

[3 of 3 positions shown; findings below may reference images not displayed]

FINDINGS: There is no evidence of fracture or dislocation. Stable chronic
flexion and extension deformities are seen involving the fifth
finger. Joint spaces are otherwise unremarkable. Soft tissues are
unremarkable.
IMPRESSION: No acute abnormality seen.

## 2021-11-02 ENCOUNTER — Emergency Department (HOSPITAL_COMMUNITY)
Admission: EM | Admit: 2021-11-02 | Discharge: 2021-11-02 | Disposition: A | Discharge status: Skilled Nursing Facility | Payer: Medicare Other | Attending: Emergency Medicine | Admitting: Emergency Medicine

## 2021-11-02 ENCOUNTER — Other Ambulatory Visit: Payer: Self-pay

## 2021-11-02 ENCOUNTER — Encounter (HOSPITAL_COMMUNITY): Payer: Self-pay | Admitting: Emergency Medicine

## 2021-11-02 ENCOUNTER — Emergency Department (HOSPITAL_COMMUNITY): Payer: Medicare Other

## 2021-11-02 ENCOUNTER — Emergency Department (HOSPITAL_COMMUNITY)
Admission: EM | Admit: 2021-11-02 | Discharge: 2021-11-03 | Disposition: A | Discharge status: Home / Self Care | Payer: Medicare Other | Source: Home / Self Care | Attending: Emergency Medicine | Admitting: Emergency Medicine

## 2021-11-02 ENCOUNTER — Encounter (HOSPITAL_COMMUNITY): Payer: Self-pay

## 2021-11-02 DIAGNOSIS — Z9181 History of falling: Secondary | ICD-10-CM | POA: Diagnosis not present

## 2021-11-02 DIAGNOSIS — Y92129 Unspecified place in nursing home as the place of occurrence of the external cause: Secondary | ICD-10-CM | POA: Insufficient documentation

## 2021-11-02 DIAGNOSIS — S01112A Laceration without foreign body of left eyelid and periocular area, initial encounter: Secondary | ICD-10-CM | POA: Insufficient documentation

## 2021-11-02 DIAGNOSIS — E039 Hypothyroidism, unspecified: Secondary | ICD-10-CM | POA: Insufficient documentation

## 2021-11-02 DIAGNOSIS — M542 Cervicalgia: Secondary | ICD-10-CM | POA: Insufficient documentation

## 2021-11-02 DIAGNOSIS — Z91040 Latex allergy status: Secondary | ICD-10-CM | POA: Insufficient documentation

## 2021-11-02 DIAGNOSIS — W19XXXA Unspecified fall, initial encounter: Secondary | ICD-10-CM | POA: Insufficient documentation

## 2021-11-02 DIAGNOSIS — I4891 Unspecified atrial fibrillation: Secondary | ICD-10-CM | POA: Insufficient documentation

## 2021-11-02 DIAGNOSIS — R569 Unspecified convulsions: Secondary | ICD-10-CM

## 2021-11-02 DIAGNOSIS — Z79899 Other long term (current) drug therapy: Secondary | ICD-10-CM | POA: Insufficient documentation

## 2021-11-02 DIAGNOSIS — S0993XA Unspecified injury of face, initial encounter: Secondary | ICD-10-CM | POA: Diagnosis present

## 2021-11-02 DIAGNOSIS — F0390 Unspecified dementia without behavioral disturbance: Secondary | ICD-10-CM | POA: Insufficient documentation

## 2021-11-02 DIAGNOSIS — S025XXA Fracture of tooth (traumatic), initial encounter for closed fracture: Secondary | ICD-10-CM | POA: Insufficient documentation

## 2021-11-02 DIAGNOSIS — W050XXA Fall from non-moving wheelchair, initial encounter: Secondary | ICD-10-CM | POA: Diagnosis not present

## 2021-11-02 DIAGNOSIS — Z7982 Long term (current) use of aspirin: Secondary | ICD-10-CM | POA: Insufficient documentation

## 2021-11-02 DIAGNOSIS — X58XXXA Exposure to other specified factors, initial encounter: Secondary | ICD-10-CM | POA: Insufficient documentation

## 2021-11-02 LAB — CBC WITH DIFFERENTIAL/PLATELET
Abs Immature Granulocytes: 0.04 10*3/uL (ref 0.00–0.07)
Basophils Absolute: 0 10*3/uL (ref 0.0–0.1)
Basophils Relative: 0 %
Eosinophils Absolute: 0.1 10*3/uL (ref 0.0–0.5)
Eosinophils Relative: 1 %
HCT: 42.3 % (ref 39.0–52.0)
Hemoglobin: 13.6 g/dL (ref 13.0–17.0)
Immature Granulocytes: 1 %
Lymphocytes Relative: 22 %
Lymphs Abs: 1.9 10*3/uL (ref 0.7–4.0)
MCH: 28.6 pg (ref 26.0–34.0)
MCHC: 32.2 g/dL (ref 30.0–36.0)
MCV: 88.9 fL (ref 80.0–100.0)
Monocytes Absolute: 0.7 10*3/uL (ref 0.1–1.0)
Monocytes Relative: 9 %
Neutro Abs: 5.9 10*3/uL (ref 1.7–7.7)
Neutrophils Relative %: 67 %
Platelets: 134 10*3/uL — ABNORMAL LOW (ref 150–400)
RBC: 4.76 MIL/uL (ref 4.22–5.81)
RDW: 13.2 % (ref 11.5–15.5)
WBC: 8.7 10*3/uL (ref 4.0–10.5)
nRBC: 0 % (ref 0.0–0.2)

## 2021-11-02 LAB — COMPREHENSIVE METABOLIC PANEL
ALT: 9 U/L (ref 0–44)
AST: 25 U/L (ref 15–41)
Albumin: 3.8 g/dL (ref 3.5–5.0)
Alkaline Phosphatase: 62 U/L (ref 38–126)
Anion gap: 10 (ref 5–15)
BUN: 20 mg/dL (ref 6–20)
CO2: 21 mmol/L — ABNORMAL LOW (ref 22–32)
Calcium: 8.5 mg/dL — ABNORMAL LOW (ref 8.9–10.3)
Chloride: 108 mmol/L (ref 98–111)
Creatinine, Ser: 0.95 mg/dL (ref 0.61–1.24)
GFR, Estimated: >60 mL/min (ref >60)
Glucose, Bld: 68 mg/dL — ABNORMAL LOW (ref 70–99)
Potassium: 4.2 mmol/L (ref 3.5–5.1)
Sodium: 139 mmol/L (ref 135–145)
Total Bilirubin: 1.2 mg/dL (ref 0.3–1.2)
Total Protein: 7.3 g/dL (ref 6.5–8.1)

## 2021-11-02 LAB — VALPROIC ACID LEVEL: Valproic Acid Lvl: 25 ug/mL — ABNORMAL LOW (ref 50.0–100.0)

## 2021-11-02 MED ORDER — VALPROATE SODIUM 100 MG/ML IV SOLN
500.0000 mg | Freq: Once | INTRAVENOUS | Status: AC
  Administered 2021-11-02: 500 mg via INTRAVENOUS
  Filled 2021-11-02: qty 5

## 2021-11-02 MED ORDER — HALOPERIDOL LACTATE 5 MG/ML IJ SOLN: 1.0000 mg | Freq: Once | INTRAMUSCULAR | Status: DC

## 2021-11-02 MED ORDER — LIDOCAINE-EPINEPHRINE-TETRACAINE (LET) TOPICAL GEL
3.0000 mL | Freq: Once | TOPICAL | Status: AC
  Administered 2021-11-03: 3 mL via TOPICAL
  Filled 2021-11-02: qty 3

## 2021-11-02 MED ORDER — HALOPERIDOL LACTATE 5 MG/ML IJ SOLN
2.0000 mg | Freq: Once | INTRAMUSCULAR | Status: AC
  Administered 2021-11-02: 2 mg via INTRAVENOUS
  Filled 2021-11-02: qty 1

## 2021-11-02 MED ORDER — LIDOCAINE-EPINEPHRINE (PF) 2 %-1:200000 IJ SOLN
20.0000 mL | Freq: Once | INTRAMUSCULAR | Status: AC
  Administered 2021-11-03: 20 mL via INTRADERMAL
  Filled 2021-11-02: qty 20

## 2021-11-02 MED ORDER — LORAZEPAM 2 MG/ML IJ SOLN
0.5000 mg | Freq: Once | INTRAMUSCULAR | Status: AC
  Administered 2021-11-02: 0.5 mg via INTRAVENOUS
  Filled 2021-11-02: qty 1

## 2021-11-02 NOTE — ED Notes (Signed)
Called [facility] x3 for report

## 2021-11-02 NOTE — ED Notes (Signed)
Transport back to nursing facility.

## 2021-11-02 NOTE — ED Triage Notes (Signed)
Patient BIB [facility] from [facility] after unwitnessed fall. History of dementia. Nurse found patient beside his wheel chair. 2 inch laceration above left eye. Not on blood thinners. C collar on arrival. Patient complaining of left sided neck pain. Able to move all extremities.  EMS vitals BP 122/76 HR 94 RR 18 O2 94% Room air 105 CBG 98.81F

## 2021-11-02 NOTE — ED Provider Notes (Signed)
[facility]-EMERGENCY DEPT Provider Note   CSN: [id number] Arrival date & time: 11/02/21  9528     History Chief Complaint  Patient presents with   Altered Mental Status    [name] is a 55 y.o. male.  55 year old male who presents after unwitnessed fall at the nursing home.  Does have a history of dementia and also seizure disorder.  Was found on the floor wandering around with a new missing tooth.  Due to his baseline condition, difficult to get history as to what happened.  Staff note that he was at his baseline.  Was sent here for further evaluation      Past Medical History:  Diagnosis Date   Addison disease (HCC)    Anoxic brain injury (HCC)    TBI   Anxiety    Aspiration pneumonia (HCC)    Atrial fibrillation (HCC)    Chronic constipation 11/23/2011   Dementia (HCC)    Dysphagia    Encephalopathy    Frontal lobe syndrome    Gait abnormality    freq falls   Glaucoma    Glucocorticoid deficiency (HCC)    History of heart attack    History of recurrent UTIs    Hyperlipemia    Hypothyroidism    Mental disorder    Myocardial infarction (HCC)    Pneumoperitoneum of unknown etiology 12/21/2011   Quadriplegia (HCC)    Reflux    Seizure disorder (HCC)    Weakness of both legs     Patient Active Problem List   Diagnosis Date Noted   PEG Gastrostomy tube in place (HCC) 01/26/2018   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 01/26/2018 01/26/2018   Bedridden 01/26/2018   Quadriplegia (HCC)    Abdominal wall abscess 01/25/2018   Fracture of humerus anatomical neck, right, closed, initial encounter 10/03/2017   AKI (acute kidney injury) (HCC) 10/03/2017   UTI (urinary tract infection) 10/03/2017   Agitation 07/03/2014   Cardiomyopathy, ischemic 06/18/2014   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   Acute psychosis (HCC) 04/19/2014   Unspecified hypothyroidism 05/16/2013   Constipation 05/16/2013   Anemia of other chronic disease  05/16/2013   Mental disorder 01/03/2013   Addisons disease (HCC) 01/03/2013   PEG (percutaneous endoscopic gastrostomy) adjustment/replacement/removal (HCC) 12/21/2011   Hypokalemia 11/25/2011   Thrombocytopenia (HCC) 11/25/2011   FTT (failure to thrive) in adult 11/25/2011   Grand mal seizure (HCC) 11/23/2011   Depression (emotion) 11/23/2011   History of atrial fibrillation 11/23/2011   Other specified hemorrhagic conditions (HCC) 11/23/2011   Glaucoma 11/23/2011   Hypoglycemia 11/23/2011   Chronic constipation 11/23/2011   Anoxic brain injury (HCC) 11/12/2011   Protein calorie malnutrition (HCC) 11/12/2011   Hyperchloremia 11/12/2011   Hyperthyroidism 11/12/2011   Acute lower UTI 11/12/2011   Dysphagia     Past Surgical History:  Procedure Laterality Date   DEBRIDEMENT OF ABDOMINAL WALL ABSCESS  01/26/2018   HERNIA MESH REMOVAL  01/26/2018   Infected mesh - removed w I&D abd wall abscess   IR GENERIC HISTORICAL  10/20/2016   IR REPLC GASTRO/COLONIC TUBE PERCUT W/FLUORO 10/20/2016 Oley Balm, MD [facility]   IR REPLACE G-TUBE SIMPLE WO FLUORO  01/28/2018   IR REPLC GASTRO/COLONIC TUBE PERCUT W/FLUORO  04/21/2017   IR REPLC GASTRO/COLONIC TUBE PERCUT W/FLUORO  07/22/2017   IR REPLC GASTRO/COLONIC TUBE PERCUT W/FLUORO  11/24/2017   IR REPLC GASTRO/COLONIC TUBE PERCUT W/FLUORO  08/12/2020   IRRIGATION AND DEBRIDEMENT ABSCESS N/A 01/26/2018   Procedure:  IRRIGATION AND DEBRIDEMENT ABDOMINAL WALL ABSCESS WITH REMOVAL OF INFECTED MESH;  Surgeon: Karie Soda, MD;  Location: [facility];  Service: General;  Laterality: N/A;   NEPHRECTOMY  unknown   PEG TUBE PLACEMENT         No family history on file.  Social History   Tobacco Use   Smoking status: Never   Smokeless tobacco: Never  Vaping Use   Vaping Use: Never used  Substance Use Topics   Alcohol use: No   Drug use: No    Home Medications Prior to Admission medications   Medication Sig Start Date End Date Taking?  Authorizing Provider  acetaminophen (TYLENOL) 325 MG tablet Place 650 mg into feeding tube every 4 (four) hours as needed for mild pain.    [provider]  Amino Acids-Protein Hydrolys (FEEDING SUPPLEMENT, PRO-STAT SUGAR FREE 64,) LIQD Place 30 mLs into feeding tube 2 (two) times daily.     [provider]  aspirin 81 MG chewable tablet Chew 81 mg by mouth daily.    [provider]  Carboxymeth-Glycerin-Polysorb (REFRESH OPTIVE ADVANCED OP) Place 1 drop into both eyes 3 (three) times daily.    [provider]  clonazePAM (KLONOPIN) 0.5 MG tablet Take 0.5 mg by mouth at bedtime.    [provider]  diclofenac Sodium (VOLTAREN) 1 % GEL Apply 4 g topically 4 (four) times daily as needed. 05/29/20   Hilts, Casimiro Needle, MD  divalproex (DEPAKOTE SPRINKLE) 125 MG capsule Take 500 mg by mouth 2 (two) times daily.  07/18/13   [provider]  levothyroxine (SYNTHROID, LEVOTHROID) 150 MCG tablet Place 150 mcg into feeding tube daily before breakfast.     [provider]  metoprolol tartrate (LOPRESSOR) 25 MG tablet Take 12.5 mg by mouth 2 (two) times daily.    [provider]  Multiple Vitamins-Minerals (CERTAVITE/ANTIOXIDANTS) LIQD 15 mLs by PEG Tube route daily at 12 noon.     [provider]  pantoprazole sodium (PROTONIX) 40 mg/20 mL PACK 40 mg by PEG Tube route daily at 12 noon.  11/30/11   [name], MD  PARoxetine (PAXIL) 10 MG tablet Take 10 mg by mouth daily.    [provider]  polyethylene glycol (MIRALAX / GLYCOLAX) packet 17 g by PEG Tube route 2 (two) times daily. Mix 17gm  In 4-8 ounces of liquid    [provider]  polyvinyl alcohol (LIQUIFILM TEARS) 1.4 % ophthalmic solution Place 1 drop into both eyes 2 (two) times daily.    [provider]  risperiDONE (RISPERDAL) 1 MG tablet Take 1 mg by mouth in the morning, at noon, in the evening, and at bedtime. 9 AM, 1 PM, 5 PM, 9 PM    [provider]  sennosides (SENOKOT) 8.8 MG/5ML syrup Place 15 mLs into feeding tube 2 (two) times daily.     [provider]  traZODone (DESYREL) 150 MG tablet Take 75 mg by mouth at bedtime.  08/02/13   [provider]    Allergies    Ativan [lorazepam], Latex, Morphine and related, Penicillins, Pyridium [phenazopyridine hcl], and Soy allergy  Review of Systems   Review of Systems  Unable to perform ROS: Dementia   Physical Exam Updated Vital Signs BP 129/76 (BP Location: Right Arm)   Pulse 71   Temp 97.9 F (36.6 C) (Oral)   Resp (!) 22   SpO2 96%   Physical Exam Vitals and nursing note reviewed.  Constitutional:  General: He is not in acute distress.    Appearance: Normal appearance. He is well-developed. He is not toxic-appearing.  HENT:     Head: Normocephalic and atraumatic.     Mouth/Throat:   Eyes:     General: Lids are normal.     Conjunctiva/sclera: Conjunctivae normal.     Pupils: Pupils are equal, round, and reactive to light.  Neck:     Thyroid: No thyroid mass.     Trachea: No tracheal deviation.  Cardiovascular:     Rate and Rhythm: Normal rate and regular rhythm.     Heart sounds: Normal heart sounds. No murmur heard.   No gallop.  Pulmonary:     Effort: Pulmonary effort is normal. No respiratory distress.     Breath sounds: Normal breath sounds. No stridor. No decreased breath sounds, wheezing, rhonchi or rales.  Abdominal:     General: There is no distension.     Palpations: Abdomen is soft.     Tenderness: There is no abdominal tenderness. There is no rebound.  Musculoskeletal:        General: No tenderness. Normal range of motion.     Cervical back: Normal range of motion and neck supple.  Skin:    General: Skin is warm and dry.     Findings: No abrasion or rash.  Neurological:     Mental Status: He is alert and oriented to person, place, and time. Mental status is at baseline.     GCS: GCS eye subscore is 4. GCS verbal  subscore is 5. GCS motor subscore is 6.     Cranial Nerves: No cranial nerve deficit.     Sensory: No sensory deficit.     Motor: Motor function is intact.     Comments: Strength is 5/5 in upper as well as lower extremities.  Psychiatric:        Attention and Perception: Attention normal.        Speech: Speech normal.        Behavior: Behavior normal.    ED Results / Procedures / Treatments   Labs (all labs ordered are listed, but only abnormal results are displayed) Labs Reviewed  CBC WITH DIFFERENTIAL/PLATELET  COMPREHENSIVE METABOLIC PANEL  VALPROIC ACID LEVEL    EKG None  Radiology No results found.  Procedures Procedures   Medications Ordered in ED Medications - No data to display  ED Course  I have reviewed the triage vital signs and the nursing notes.  Pertinent labs & imaging results that were available during my care of the patient were reviewed by me and considered in my medical decision making (see chart for details).    MDM Rules/Calculators/A&P                          Depakote level low and patient given IV Depakote. Patient now back to neurological baseline at this time.  Ambulatory in department.  Head CT without acute findings.  C-spine without findings.  No seizure activity here.  Will discharge back Final Clinical Impression(s) / ED Diagnoses Final diagnoses:  None    Rx / DC Orders ED Discharge Orders     None        Lorre Nick, MD 11/02/21 1406

## 2021-11-02 NOTE — ED Notes (Signed)
Attempted to call report, was placed on hold for several minutes, then transferred to a phone that rang forever, then hung up on.

## 2021-11-02 NOTE — ED Triage Notes (Addendum)
Per EMS, patient from [facility], found at 0600 crawling around on floor, patient states he thinks he had a seizure. Baseline per staff. Missing front tooth with blood in mouth. Slurred speech baseline. Hx demetnia, seziures, psychosis. Wheel chair bound.

## 2021-11-02 NOTE — ED Notes (Signed)
ED Provider at bedside. 

## 2021-11-02 NOTE — ED Provider Notes (Signed)
[facility]  HOSPITAL-EMERGENCY DEPT Provider Note  CSN: [id number] Arrival date & time: 11/02/21 2237  Chief Complaint(s) Fall ED Triage Notes [name], RN (Registered Nurse)   Emergency Medicine   Date of Service: 11/02/2021 10:48 PM   Signed   Patient BIB [facility] from [facility] after unwitnessed fall. History of dementia. Nurse found patient beside his wheel chair. 2 inch laceration above left eye. Not on blood thinners. C collar on arrival. Patient complaining of left sided neck pain. Able to move all extremities     HPI [name] is a 55 y.o. male here for unwitnessed fall skilled nursing facility.  This resulted in a laceration over the left eye.  No active bleeding.  No anticoagulation.  Remainder of history, ROS, and physical exam limited due to patient's condition (dementia). Additional information was obtained from EMS.   Level V Caveat.    Fall   Past Medical History Past Medical History:  Diagnosis Date   Addison disease (HCC)    Anoxic brain injury (HCC)    TBI   Anxiety    Aspiration pneumonia (HCC)    Atrial fibrillation (HCC)    Chronic constipation 11/23/2011   Dementia (HCC)    Dysphagia    Encephalopathy    Frontal lobe syndrome    Gait abnormality    freq falls   Glaucoma    Glucocorticoid deficiency (HCC)    History of heart attack    History of recurrent UTIs    Hyperlipemia    Hypothyroidism    Mental disorder    Myocardial infarction (HCC)    Pneumoperitoneum of unknown etiology 12/21/2011   Quadriplegia (HCC)    Reflux    Seizure disorder (HCC)    Weakness of both legs    Patient Active Problem List   Diagnosis Date Noted   PEG Gastrostomy tube in place (HCC) 01/26/2018   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 01/26/2018 01/26/2018   Bedridden 01/26/2018   Quadriplegia (HCC)    Abdominal wall abscess 01/25/2018   Fracture of humerus anatomical neck, right, closed, initial encounter 10/03/2017    AKI (acute kidney injury) (HCC) 10/03/2017   UTI (urinary tract infection) 10/03/2017   Agitation 07/03/2014   Cardiomyopathy, ischemic 06/18/2014   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   Acute psychosis (HCC) 04/19/2014   Unspecified hypothyroidism 05/16/2013   Constipation 05/16/2013   Anemia of other chronic disease 05/16/2013   Mental disorder 01/03/2013   Addisons disease (HCC) 01/03/2013   PEG (percutaneous endoscopic gastrostomy) adjustment/replacement/removal (HCC) 12/21/2011   Hypokalemia 11/25/2011   Thrombocytopenia (HCC) 11/25/2011   FTT (failure to thrive) in adult 11/25/2011   Grand mal seizure (HCC) 11/23/2011   Depression (emotion) 11/23/2011   History of atrial fibrillation 11/23/2011   Other specified hemorrhagic conditions (HCC) 11/23/2011   Glaucoma 11/23/2011   Hypoglycemia 11/23/2011   Chronic constipation 11/23/2011   Anoxic brain injury (HCC) 11/12/2011   Protein calorie malnutrition (HCC) 11/12/2011   Hyperchloremia 11/12/2011   Hyperthyroidism 11/12/2011   Acute lower UTI 11/12/2011   Dysphagia    Home Medication(s) Prior to Admission medications   Medication Sig Start Date End Date Taking? Authorizing Provider  acetaminophen (TYLENOL) 325 MG tablet Place 650 mg into feeding tube every 4 (four) hours as needed for mild pain.    [provider]  Amino Acids-Protein Hydrolys (FEEDING SUPPLEMENT, PRO-STAT SUGAR FREE 64,) LIQD Place 30 mLs into feeding tube 2 (two) times daily.     [provider]  aspirin 81 MG chewable tablet Chew 81 mg by mouth daily.    [provider]  Carboxymeth-Glycerin-Polysorb (REFRESH OPTIVE ADVANCED OP) Place 1 drop into both eyes 3 (three) times daily.    [provider]  clonazePAM (KLONOPIN) 0.5 MG tablet Take 0.5 mg by mouth at bedtime.    [provider]  diclofenac Sodium (VOLTAREN) 1 % GEL Apply 4 g topically 4 (four) times daily as needed. 05/29/20   Hilts, Casimiro Needle, MD  divalproex  (DEPAKOTE SPRINKLE) 125 MG capsule Take 500 mg by mouth 2 (two) times daily.  07/18/13   [provider]  levothyroxine (SYNTHROID, LEVOTHROID) 150 MCG tablet Place 150 mcg into feeding tube daily before breakfast.     [provider]  metoprolol tartrate (LOPRESSOR) 25 MG tablet Take 12.5 mg by mouth 2 (two) times daily.    [provider]  Multiple Vitamins-Minerals (CERTAVITE/ANTIOXIDANTS) LIQD 15 mLs by PEG Tube route daily at 12 noon.     [provider]  pantoprazole sodium (PROTONIX) 40 mg/20 mL PACK 40 mg by PEG Tube route daily at 12 noon.  11/30/11   [name], MD  PARoxetine (PAXIL) 10 MG tablet Take 10 mg by mouth daily.    [provider]  polyethylene glycol (MIRALAX / GLYCOLAX) packet 17 g by PEG Tube route 2 (two) times daily. Mix 17gm  In 4-8 ounces of liquid    [provider]  polyvinyl alcohol (LIQUIFILM TEARS) 1.4 % ophthalmic solution Place 1 drop into both eyes 2 (two) times daily.    [provider]  risperiDONE (RISPERDAL) 1 MG tablet Take 1 mg by mouth in the morning, at noon, in the evening, and at bedtime. 9 AM, 1 PM, 5 PM, 9 PM    [provider]  sennosides (SENOKOT) 8.8 MG/5ML syrup Place 15 mLs into feeding tube 2 (two) times daily.     [provider]  traZODone (DESYREL) 150 MG tablet Take 75 mg by mouth at bedtime.  08/02/13   [provider]                                                                                                                                    Past Surgical History Past Surgical History:  Procedure Laterality Date   DEBRIDEMENT OF ABDOMINAL WALL ABSCESS  01/26/2018   HERNIA MESH REMOVAL  01/26/2018   Infected mesh - removed w I&D abd wall abscess   IR GENERIC HISTORICAL  10/20/2016   IR REPLC GASTRO/COLONIC TUBE PERCUT W/FLUORO 10/20/2016 Oley Balm, MD [facility]   IR REPLACE G-TUBE SIMPLE WO FLUORO  01/28/2018   IR REPLC GASTRO/COLONIC  TUBE PERCUT W/FLUORO  04/21/2017   IR REPLC GASTRO/COLONIC TUBE PERCUT W/FLUORO  07/22/2017   IR REPLC GASTRO/COLONIC TUBE PERCUT W/FLUORO  11/24/2017   IR REPLC GASTRO/COLONIC TUBE PERCUT W/FLUORO  08/12/2020   IRRIGATION AND DEBRIDEMENT ABSCESS N/A 01/26/2018  Procedure: IRRIGATION AND DEBRIDEMENT ABDOMINAL WALL ABSCESS WITH REMOVAL OF INFECTED MESH;  Surgeon: Karie Soda, MD;  Location: [facility];  Service: General;  Laterality: N/A;   NEPHRECTOMY  unknown   PEG TUBE PLACEMENT     Family History History reviewed. No pertinent family history.  Social History Social History   Tobacco Use   Smoking status: Never   Smokeless tobacco: Never  Vaping Use   Vaping Use: Never used  Substance Use Topics   Alcohol use: No   Drug use: No   Allergies Ativan [lorazepam], Latex, Morphine and related, Penicillins, Pyridium [phenazopyridine hcl], and Soy allergy  Review of Systems Review of Systems Unable to obtain due to dementia Physical Exam Vital Signs  I have reviewed the triage vital signs BP 125/80   Pulse 85   Temp 98.6 F (37 C) (Oral)   Resp 19   SpO2 93%   Physical Exam Constitutional:      General: He is not in acute distress.    Appearance: He is well-developed. He is not diaphoretic.  HENT:     Head: Normocephalic. Laceration present.     Right Ear: External ear normal.     Left Ear: External ear normal.  Eyes:     General: No scleral icterus.       Right eye: No discharge.        Left eye: No discharge.     Conjunctiva/sclera: Conjunctivae normal.     Pupils: Pupils are equal, round, and reactive to light.   Cardiovascular:     Rate and Rhythm: Regular rhythm.     Pulses:          Radial pulses are 2+ on the right side and 2+ on the left side.       Dorsalis pedis pulses are 2+ on the right side and 2+ on the left side.     Heart sounds: Normal heart sounds. No murmur heard.   No friction rub. No gallop.  Pulmonary:     Effort: Pulmonary effort is normal.  No respiratory distress.     Breath sounds: Normal breath sounds. No stridor.  Abdominal:     General: There is no distension.     Palpations: Abdomen is soft.     Tenderness: There is no abdominal tenderness.  Musculoskeletal:     Cervical back: Normal range of motion and neck supple. No bony tenderness.     Thoracic back: No bony tenderness.     Lumbar back: No bony tenderness.     Comments: Clavicle stable. Chest stable to AP/Lat compression. Pelvis stable to Lat compression. No obvious extremity deformity. No chest or abdominal wall contusion.  Skin:    General: Skin is warm.  Neurological:     Mental Status: He is alert and oriented to person, place, and time.     GCS: GCS eye subscore is 4. GCS verbal subscore is 5. GCS motor subscore is 6.     Comments: Moving all extremities     ED Results and Treatments Labs (all labs ordered are listed, but only abnormal results are displayed) Labs Reviewed - No data to display  EKG  EKG Interpretation  Date/Time:    Ventricular Rate:    PR Interval:    QRS Duration:   QT Interval:    QTC Calculation:   R Axis:     Text Interpretation:         Radiology CT Head Wo Contrast  Result Date: 11/03/2021 CLINICAL DATA:  Head trauma. EXAM: CT HEAD WITHOUT CONTRAST CT CERVICAL SPINE WITHOUT CONTRAST TECHNIQUE: Multidetector CT imaging of the head and cervical spine was performed following the standard protocol without intravenous contrast. Multiplanar CT image reconstructions of the cervical spine were also generated. COMPARISON:  None. FINDINGS: CT HEAD FINDINGS Brain: Mild age-related atrophy and chronic microvascular ischemic changes. There is no acute intracranial hemorrhage. No mass effect or midline shift. No extra-axial fluid collection. Vascular: No hyperdense vessel or unexpected calcification. Skull: Normal.  Negative for fracture or focal lesion. Sinuses/Orbits: Old-appearing fracture of the left lamina Propecia and left nasal bone. Mild mucoperiosteal thickening of paranasal sinuses. No air-fluid level. The mastoid air cells are clear. Other: None CT CERVICAL SPINE FINDINGS Alignment: No acute subluxation. There is straightening of normal cervical lordosis which may be positional or due to muscle spasm. Skull base and vertebrae: No acute fracture. Soft tissues and spinal canal: No prevertebral fluid or swelling. No visible canal hematoma. Disc levels:  Multilevel degenerative changes. Upper chest: Negative. Other: None IMPRESSION: 1. No acute intracranial pathology. Mild age-related atrophy and chronic microvascular ischemic changes. 2. No acute/traumatic cervical spine pathology. Electronically Signed   By: Elgie Collard M.D.   On: 11/03/2021 02:39   CT Head Wo Contrast  Result Date: 11/02/2021 CLINICAL DATA:  Facial trauma. EXAM: CT HEAD WITHOUT CONTRAST TECHNIQUE: Multidetector CT imaging of the head were performed using the standard protocol without intravenous contrast. Multiplanar CT image reconstructions of the head were also generated. COMPARISON:  CT head 08/12/2021 FINDINGS: CT HEAD FINDINGS Brain: Motion degraded study. Mild atrophy.  Negative for acute infarct, hemorrhage, mass. Vascular: Negative for hyperdense vessel Skull: Negative Other: None IMPRESSION: 1. Images are degraded by motion on all studies. 2. No acute intracranial abnormality Electronically Signed   By: Marlan Palau M.D.   On: 11/02/2021 11:54   CT Cervical Spine Wo Contrast  Result Date: 11/03/2021 CLINICAL DATA:  Head trauma. EXAM: CT HEAD WITHOUT CONTRAST CT CERVICAL SPINE WITHOUT CONTRAST TECHNIQUE: Multidetector CT imaging of the head and cervical spine was performed following the standard protocol without intravenous contrast. Multiplanar CT image reconstructions of the cervical spine were also generated. COMPARISON:   None. FINDINGS: CT HEAD FINDINGS Brain: Mild age-related atrophy and chronic microvascular ischemic changes. There is no acute intracranial hemorrhage. No mass effect or midline shift. No extra-axial fluid collection. Vascular: No hyperdense vessel or unexpected calcification. Skull: Normal. Negative for fracture or focal lesion. Sinuses/Orbits: Old-appearing fracture of the left lamina Propecia and left nasal bone. Mild mucoperiosteal thickening of paranasal sinuses. No air-fluid level. The mastoid air cells are clear. Other: None CT CERVICAL SPINE FINDINGS Alignment: No acute subluxation. There is straightening of normal cervical lordosis which may be positional or due to muscle spasm. Skull base and vertebrae: No acute fracture. Soft tissues and spinal canal: No prevertebral fluid or swelling. No visible canal hematoma. Disc levels:  Multilevel degenerative changes. Upper chest: Negative. Other: None IMPRESSION: 1. No acute intracranial pathology. Mild age-related atrophy and chronic microvascular ischemic changes. 2. No acute/traumatic cervical spine pathology. Electronically Signed   By: Elgie Collard M.D.   On: 11/03/2021 02:39  CT Cervical Spine Wo Contrast  Result Date: 11/02/2021 CLINICAL DATA:  Facial trauma. EXAM: CT MAXILLOFACIAL WITHOUT CONTRAST CT CERVICAL SPINE WITHOUT CONTRAST TECHNIQUE: Multidetector CT imaging of the cervical spine, and maxillofacial structures were performed using the standard protocol without intravenous contrast. Multiplanar CT image reconstructions of the cervical spine and maxillofacial structures were also generated. COMPARISON:  CT head max and cervical 07/30/2021 FINDINGS: CT MAXILLOFACIAL FINDINGS Osseous: Chronic depressed fracture of the nasal bone. Chronic fracture of the nasal septum. These are unchanged from the prior study. Negative for acute facial fracture Orbits: Negative for orbital fracture. Negative for orbital mass or edema. Sinuses: Mild mucosal edema  maxillary sinus bilaterally. No air-fluid level Soft tissues: No significant soft tissue swelling. The patient was belligerent and not holding still for the initial scan. The patient returned to the emergency department for sedation and the cervical and maxillofacial CT were repeated after sedation. CT CERVICAL SPINE FINDINGS Alignment: Normal Skull base and vertebrae: Negative for fracture Soft tissues and spinal canal: No acute abnormality Disc levels: Multilevel spondylosis. Disc degeneration and prominent spurring is present C3 through C7 causing spinal and foraminal stenosis. Upper chest: Lung apices clear bilaterally Other: None IMPRESSION: 1. Negative for acute facial fracture. Chronic fracture of the nasal bone and nasal septum 2. Cervical spondylosis.  Negative for cervical spine fracture. Electronically Signed   By: Marlan Palau M.D.   On: 11/02/2021 13:24   CT Maxillofacial WO CM  Result Date: 11/02/2021 CLINICAL DATA:  Facial trauma. EXAM: CT MAXILLOFACIAL WITHOUT CONTRAST CT CERVICAL SPINE WITHOUT CONTRAST TECHNIQUE: Multidetector CT imaging of the cervical spine, and maxillofacial structures were performed using the standard protocol without intravenous contrast. Multiplanar CT image reconstructions of the cervical spine and maxillofacial structures were also generated. COMPARISON:  CT head max and cervical 07/30/2021 FINDINGS: CT MAXILLOFACIAL FINDINGS Osseous: Chronic depressed fracture of the nasal bone. Chronic fracture of the nasal septum. These are unchanged from the prior study. Negative for acute facial fracture Orbits: Negative for orbital fracture. Negative for orbital mass or edema. Sinuses: Mild mucosal edema maxillary sinus bilaterally. No air-fluid level Soft tissues: No significant soft tissue swelling. The patient was belligerent and not holding still for the initial scan. The patient returned to the emergency department for sedation and the cervical and maxillofacial CT were  repeated after sedation. CT CERVICAL SPINE FINDINGS Alignment: Normal Skull base and vertebrae: Negative for fracture Soft tissues and spinal canal: No acute abnormality Disc levels: Multilevel spondylosis. Disc degeneration and prominent spurring is present C3 through C7 causing spinal and foraminal stenosis. Upper chest: Lung apices clear bilaterally Other: None IMPRESSION: 1. Negative for acute facial fracture. Chronic fracture of the nasal bone and nasal septum 2. Cervical spondylosis.  Negative for cervical spine fracture. Electronically Signed   By: Marlan Palau M.D.   On: 11/02/2021 13:24    Pertinent labs & imaging results that were available during my care of the patient were reviewed by me and considered in my medical decision making (see MDM for details).  Medications Ordered in ED Medications  lidocaine-EPINEPHrine (XYLOCAINE W/EPI) 2 %-1:200000 (PF) injection 20 mL (20 mLs Intradermal Given 11/03/21 0033)  lidocaine-EPINEPHrine-tetracaine (LET) topical gel (3 mLs Topical Given 11/03/21 0033)  haloperidol lactate (HALDOL) injection 2 mg (2 mg Intramuscular Given 11/03/21 0401)  Procedures .Marland KitchenLaceration Repair  Date/Time: 11/03/2021 5:11 AM Performed by: Nira Conn, MD Authorized by: Nira Conn, MD   Consent:    Consent obtained:  Verbal   Risks discussed:  Poor wound healing and need for additional repair Universal protocol:    Immediately prior to procedure, a time out was called: yes     Patient identity confirmed:  Arm band Laceration details:    Location:  Face   Face location:  L upper eyelid   Extent:  Superficial   Length (cm):  3   Depth (mm):  2 Pre-procedure details:    Preparation:  Patient was prepped and draped in usual sterile fashion and imaging obtained to evaluate for foreign bodies Exploration:     Hemostasis achieved with:  Direct pressure   Imaging obtained comment:  CT   Wound extent: no fascia violation noted, no foreign bodies/material noted, no muscle damage noted, no tendon damage noted and no vascular damage noted     Contaminated: no   Treatment:    Area cleansed with:  Saline   Amount of cleaning:  Extensive   Irrigation solution:  Sterile saline   Irrigation volume:  1000cc   Debridement:  None Skin repair:    Repair method:  Sutures   Suture size:  5-0   Suture material:  Fast-absorbing gut   Number of sutures:  6 Approximation:    Approximation:  Close Repair type:    Repair type:  Simple Post-procedure details:    Dressing:  Antibiotic ointment   Procedure completion:  Tolerated  (including critical care time)  Medical Decision Making / ED Course I have reviewed the nursing notes for this encounter and the patient's prior records (if available in EHR or on provided paperwork).  [name] was evaluated in Emergency Department on 11/03/2021 for the symptoms described in the history of present illness. He was evaluated in the context of the global COVID-19 pandemic, which necessitated consideration that the patient might be at risk for infection with the SARS-CoV-2 virus that causes COVID-19. Institutional protocols and algorithms that pertain to the evaluation of patients at risk for COVID-19 are in a state of rapid change based on information released by regulatory bodies including the CDC and federal and state organizations. These policies and algorithms were followed during the patient's care in the ED.     Unwitnessed fall in a skilled nursing facility. Laceration above the left eye. CT scan of the head and cervical spine obtained and negative for any acute traumatic injuries. Patient became agitated and attempted to get out of bed. Given haldol IM   Pertinent labs & imaging results that were available during my care of the patient were reviewed by me  and considered in my medical decision making:  Laceration thoroughly irrigated and closed as above.  Final Clinical Impression(s) / ED Diagnoses Final diagnoses:  Fall at nursing home, initial encounter  Left eyelid laceration, initial encounter   The patient appears reasonably screened and/or stabilized for discharge and I doubt any other medical condition or other [facility] requiring further screening, evaluation, or treatment in the ED at this time prior to discharge. Safe for discharge with strict return precautions.  Disposition: Discharge  Condition: Good  I have discussed the results, Dx and Tx plan with the patient/family who expressed understanding and agree(s) with the plan. Discharge instructions discussed at length. The patient/family was given strict return precautions who verbalized understanding of the instructions. No further questions at time  of discharge.    ED Discharge Orders     None       Follow Up: Georgann Housekeeper, MD [address] [place] [address] [phone]  Call  as needed     This chart was dictated using voice recognition software.  Despite best efforts to proofread,  errors can occur which can change the documentation meaning.    Nira Conn, MD 11/03/21 [phone]

## 2021-11-02 NOTE — ED Notes (Signed)
Bed alarm set.

## 2021-11-03 ENCOUNTER — Emergency Department (HOSPITAL_COMMUNITY): Payer: Medicare Other

## 2021-11-03 DIAGNOSIS — S025XXA Fracture of tooth (traumatic), initial encounter for closed fracture: Secondary | ICD-10-CM | POA: Diagnosis not present

## 2021-11-03 MED ORDER — HALOPERIDOL LACTATE 5 MG/ML IJ SOLN
2.0000 mg | Freq: Once | INTRAMUSCULAR | Status: AC
  Administered 2021-11-03: 2 mg via INTRAMUSCULAR
  Filled 2021-11-03: qty 1

## 2021-11-03 NOTE — ED Notes (Signed)
Attempted to call nursing facility, no answer. 

## 2021-11-03 NOTE — ED Notes (Signed)
Restraints dc per MD order/ pt is being dcd. Pt is alert, followed basic command such as lift arms. Denies complaints at this time, VSS

## 2021-11-03 NOTE — ED Notes (Signed)
Patient said "I am going to crawl out of the bed. I need to go home. They aint doing shit."

## 2021-11-03 NOTE — ED Notes (Signed)
EMS now here to take pt
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
## 2021-11-03 NOTE — ED Notes (Signed)
Lurena Joiner RN called this RN into the room stating the patient had another unwitnessed crawl out of the bed. Patient had on nonskid socks and seizure pads on both sides of the rails prior to the patient being found outside of the bed. Patient does not report any new injuries. Patient stating he was trying to go home. Dr. Eudelia Bunch at bedside, Charge RN Leta Jungling at bedside. New set of vitals and musculoskeletal assessment performed.

## 2021-11-03 NOTE — ED Notes (Signed)
[facility] called for transport.  

## 2021-11-03 NOTE — ED Notes (Signed)
Suture cart at bedside. Wound irrigated and cleaned.
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
## 2021-12-05 IMAGING — CT CT MAXILLOFACIAL W/O CM
3 of 4 series · 15 of 47 positions shown, 18 images · non-contrast
Comparison: CT head max and cervical 07/30/2021

CLINICAL DATA: Facial trauma.

EXAM:
CT MAXILLOFACIAL WITHOUT CONTRAST
CT CERVICAL SPINE WITHOUT CONTRAST
TECHNIQUE: Multidetector CT imaging of the cervical spine, and maxillofacial
structures were performed using the standard protocol without
intravenous contrast. Multiplanar CT image reconstructions of the
cervical spine and maxillofacial structures were also generated.

[Series 6: max soft · axial · 0.45mm/px · z∈[-256,-80]mm · 9 of 111 slices shown, 12 images]
[im 12/111  brain]
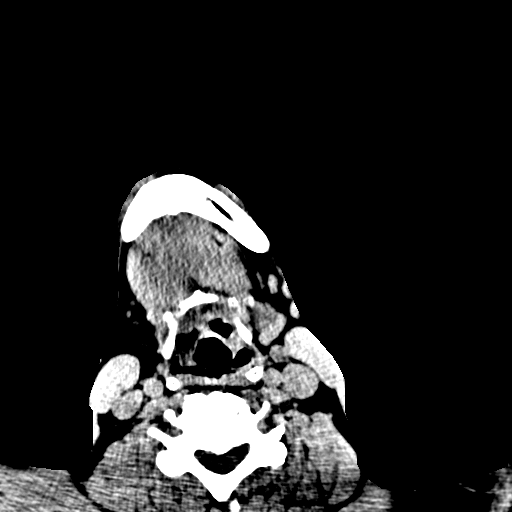
[im 12/111  bone]
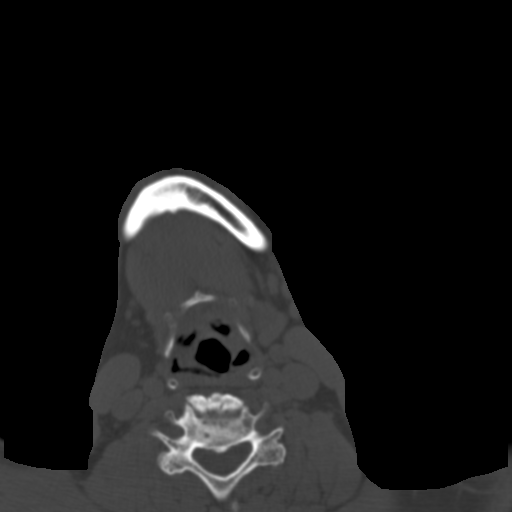
[im 23/111  bone]
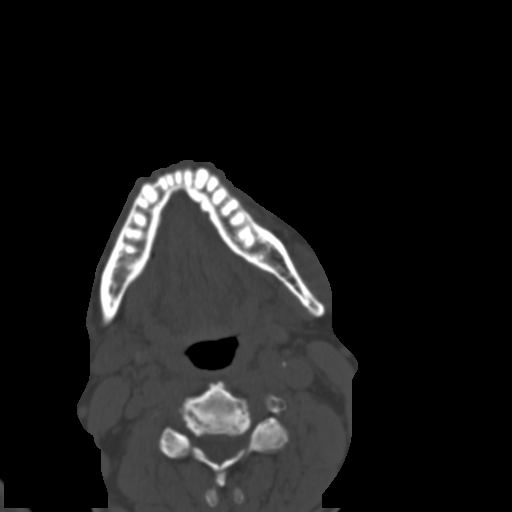
[im 34/111  bone]
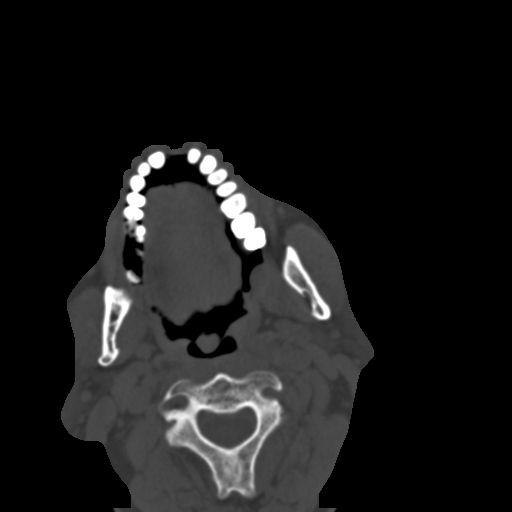
[im 45/111  bone]
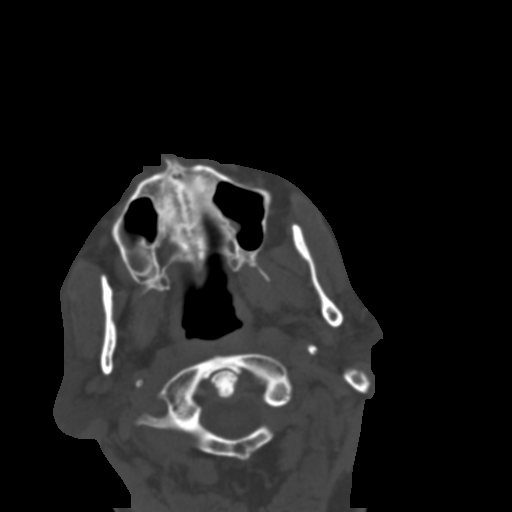
[im 56/111  brain]
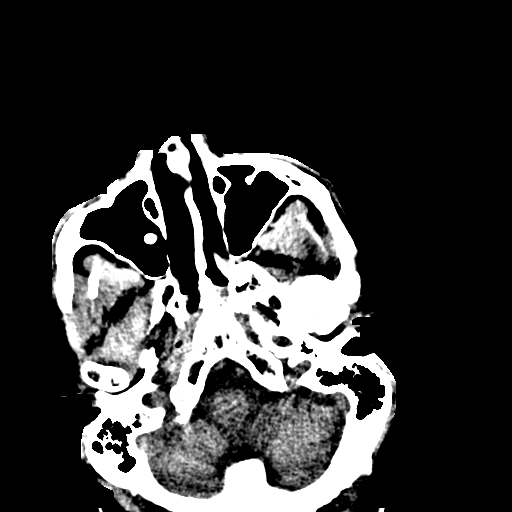
[im 56/111  bone]
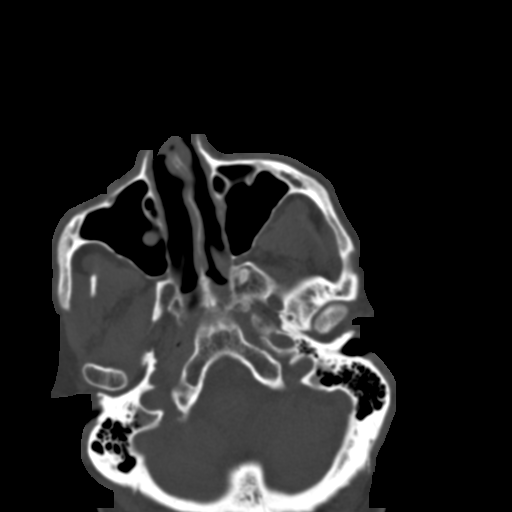
[im 67/111  bone]
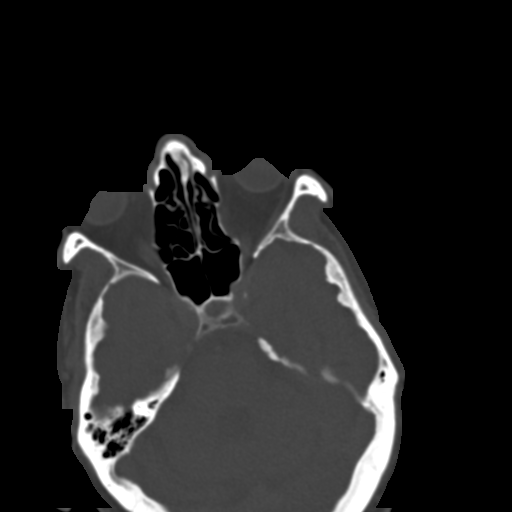
[im 78/111  bone]
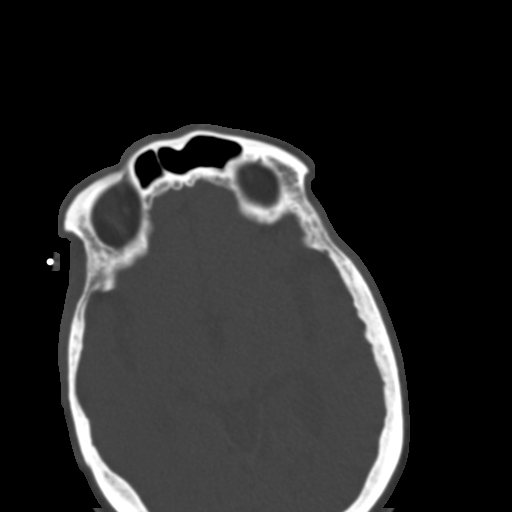
[im 89/111  bone]
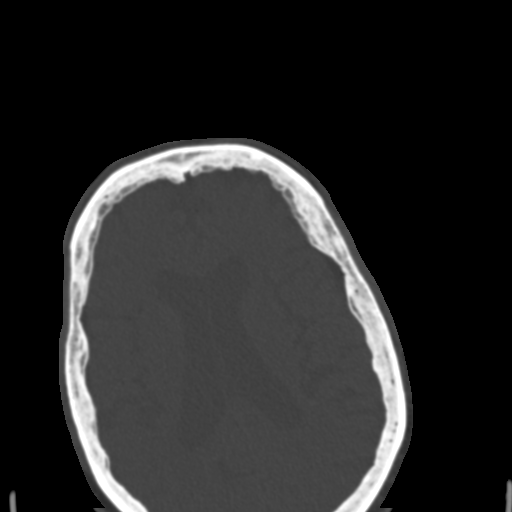
[im 100/111  brain]
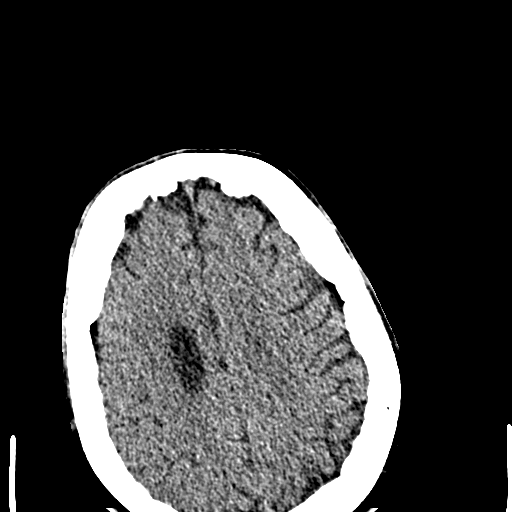
[im 100/111  bone]
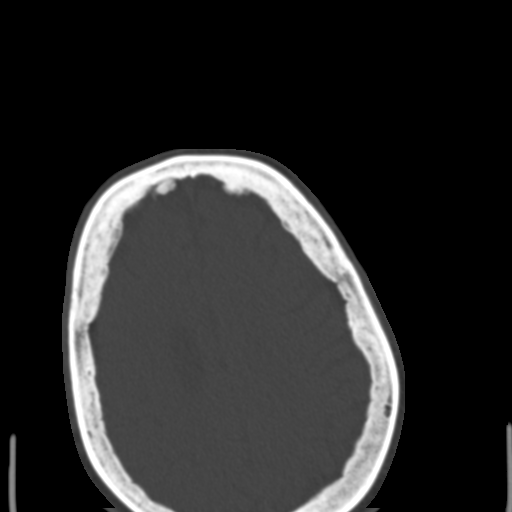

[Series 10: coronal soft · coronal · 0.43mm/px · 3 of 92 slices shown]
[im 31/92  bone]
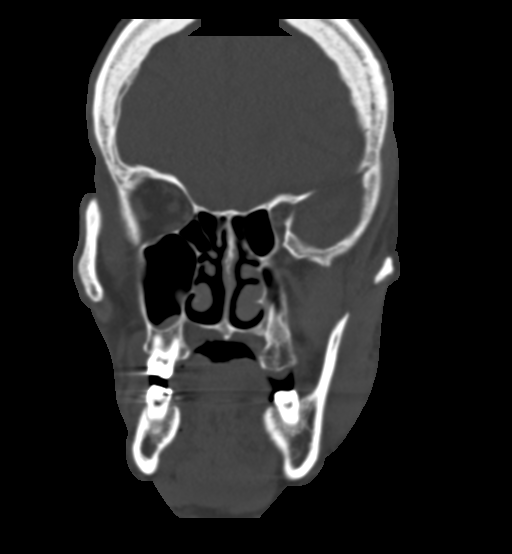
[im 41/92  bone]
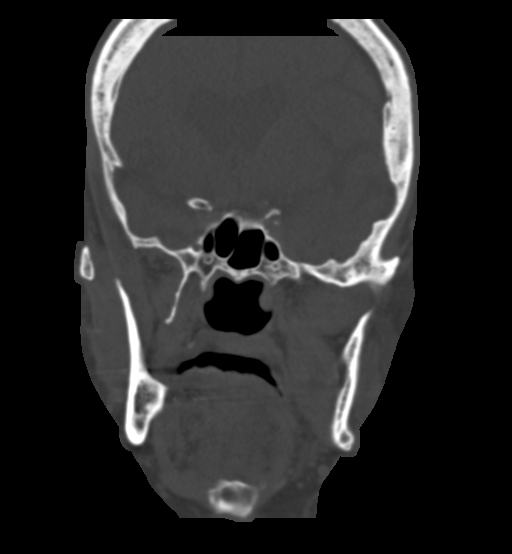
[im 51/92  bone]
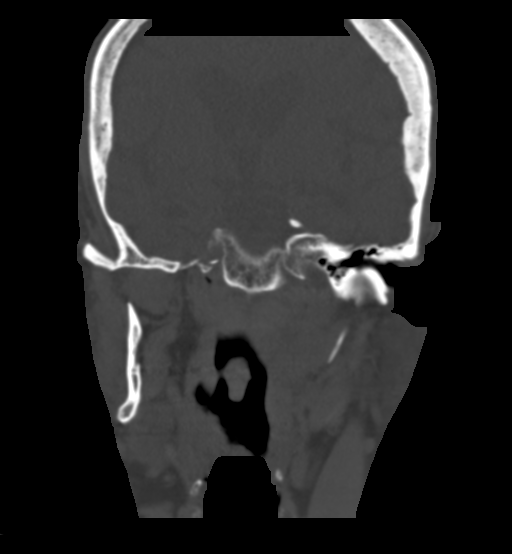

[Series 11: sagittal soft · sagittal · 0.46mm/px · 3 of 89 slices shown]
[im 30/89  bone]
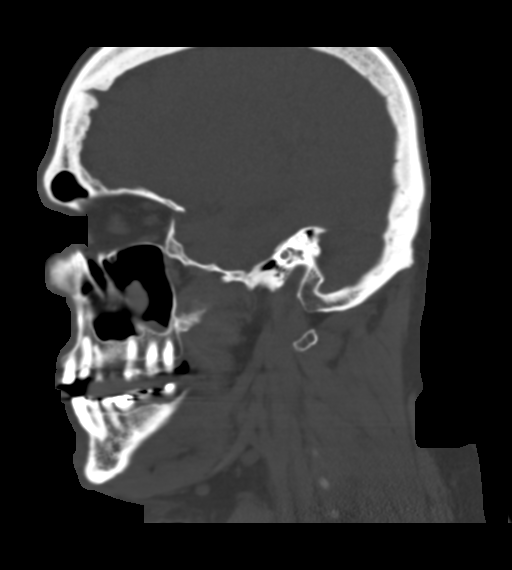
[im 45/89  bone]
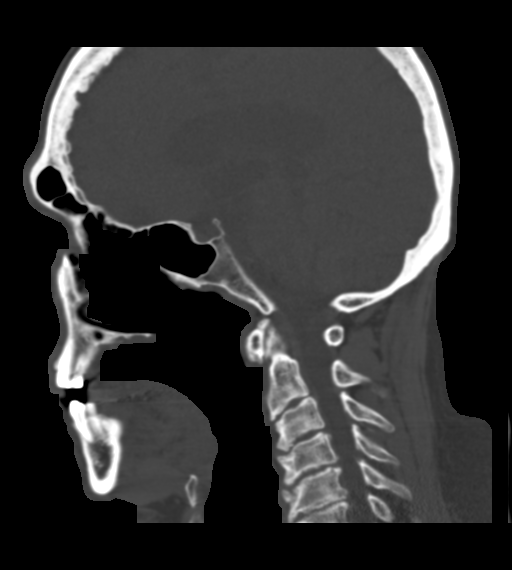
[im 59/89  bone]
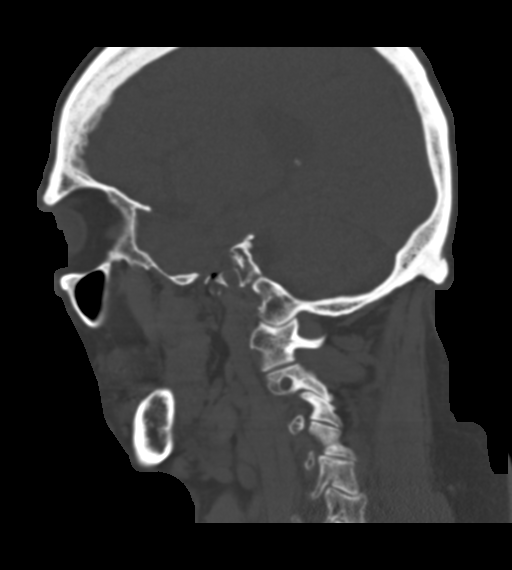

[15 of 47 positions shown; findings below may reference images not displayed]

FINDINGS: CT MAXILLOFACIAL FINDINGS

Osseous: Chronic depressed fracture of the nasal bone. Chronic
fracture of the nasal septum. These are unchanged from the prior
study.

Negative for acute facial fracture

Orbits: Negative for orbital fracture. Negative for orbital mass or
edema.

Sinuses: Mild mucosal edema maxillary sinus bilaterally. No
air-fluid level

Soft tissues: No significant soft tissue swelling.

The patient was belligerent and not holding still for the initial
scan. The patient returned to the emergency department for sedation
and the cervical and maxillofacial CT were repeated after sedation.

CT CERVICAL SPINE FINDINGS

Alignment: Normal

Skull base and vertebrae: Negative for fracture

Soft tissues and spinal canal: No acute abnormality

Disc levels: Multilevel spondylosis. Disc degeneration and prominent
spurring is present C3 through C7 causing spinal and foraminal
stenosis.

Upper chest: Lung apices clear bilaterally

Other: None
IMPRESSION: 1. Negative for acute facial fracture. Chronic fracture of the nasal
bone and nasal septum
2. Cervical spondylosis.  Negative for cervical spine fracture.

## 2021-12-05 IMAGING — CT CT CERVICAL SPINE W/O CM
3 of 9 series · 9 of 33 positions shown, 10 images · non-contrast
Comparison: CT head max and cervical 07/30/2021

CLINICAL DATA: Facial trauma.

EXAM:
CT MAXILLOFACIAL WITHOUT CONTRAST
CT CERVICAL SPINE WITHOUT CONTRAST
TECHNIQUE: Multidetector CT imaging of the cervical spine, and maxillofacial
structures were performed using the standard protocol without
intravenous contrast. Multiplanar CT image reconstructions of the
cervical spine and maxillofacial structures were also generated.

[Series 7: orthogonal axials · axial · 0.23mm/px · z∈[-387,-167]mm · 3 of 113 slices shown, 4 images]
[im 1/113  soft-tissue]
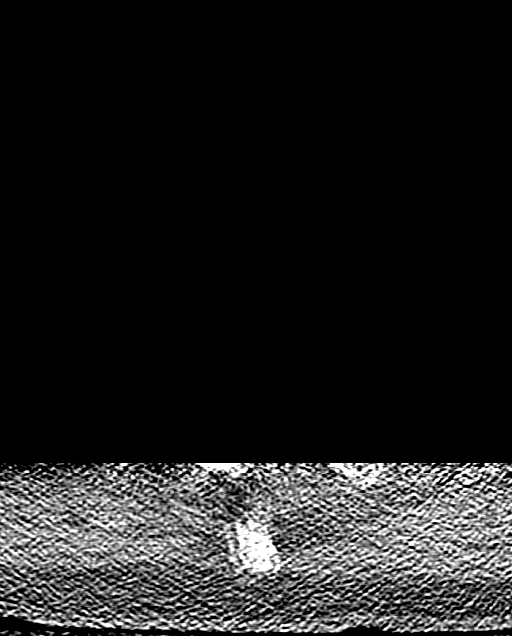
[im 1/113  bone]
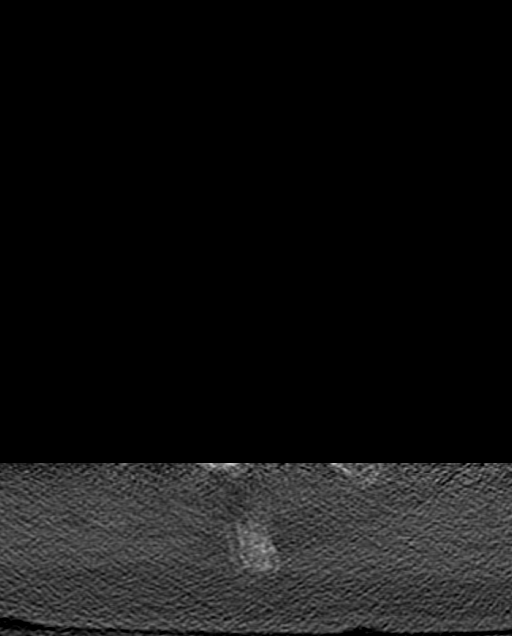
[im 57/113  bone]
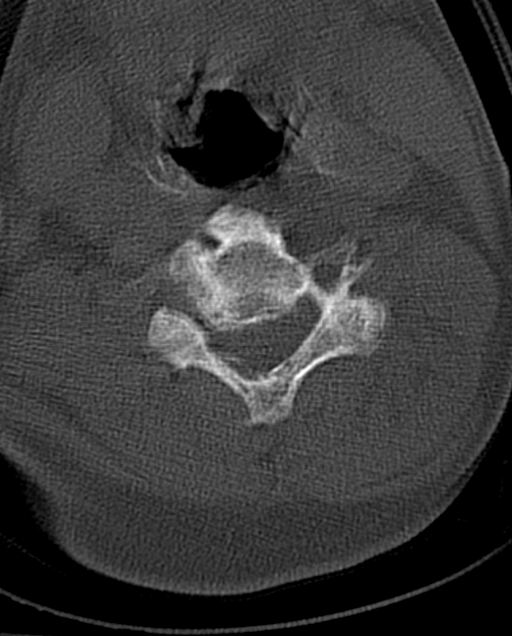
[im 113/113  bone]
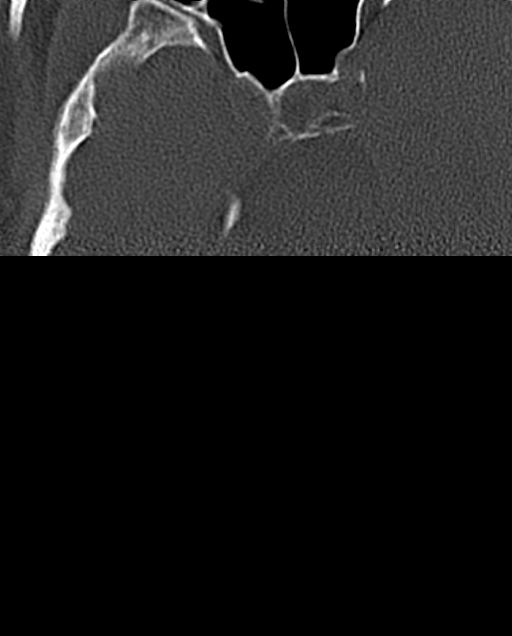

[Series 8: coronal bone · coronal · 0.30mm/px · 1 of 61 slices shown]
[im 31/61  bone]
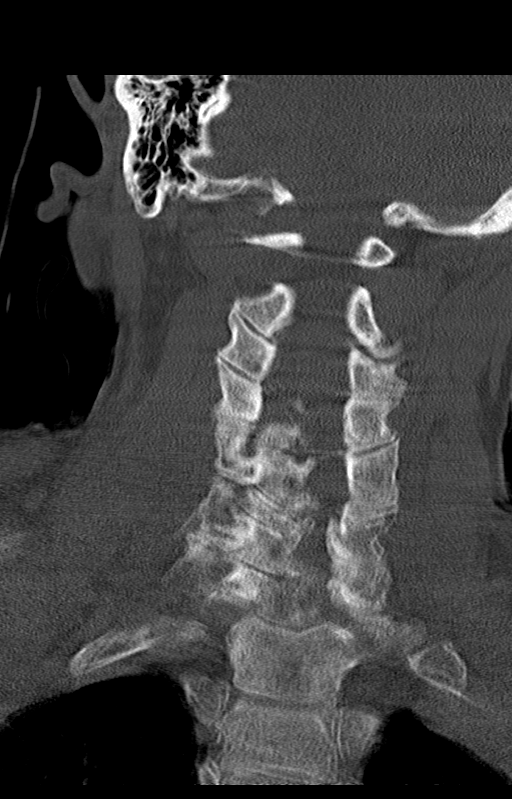

[Series 9: sagittal bone · sagittal · 0.30mm/px · 5 of 61 slices shown]
[im 11/61  bone]
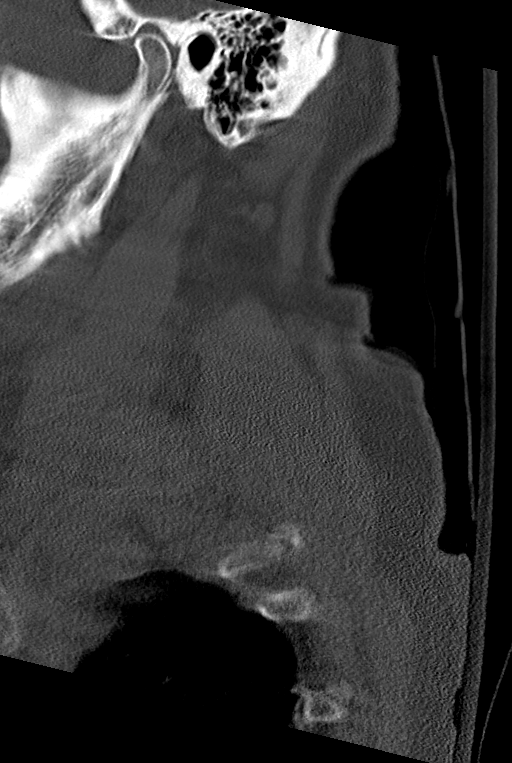
[im 21/61  bone]
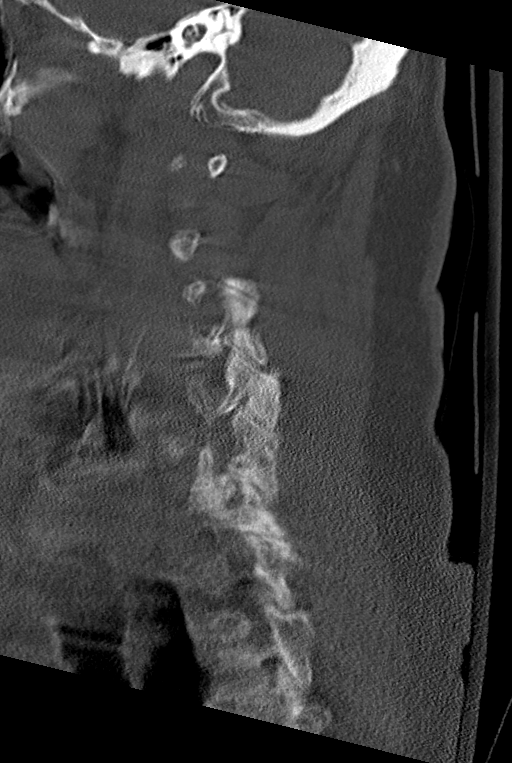
[im 31/61  bone]
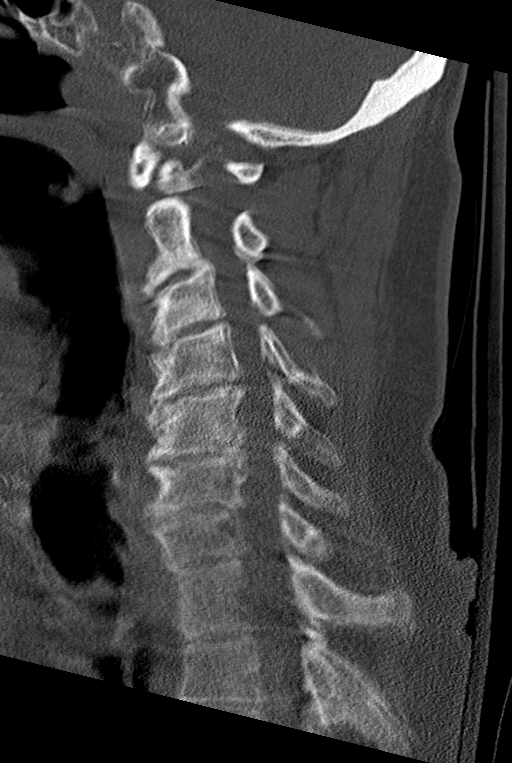
[im 41/61  bone]
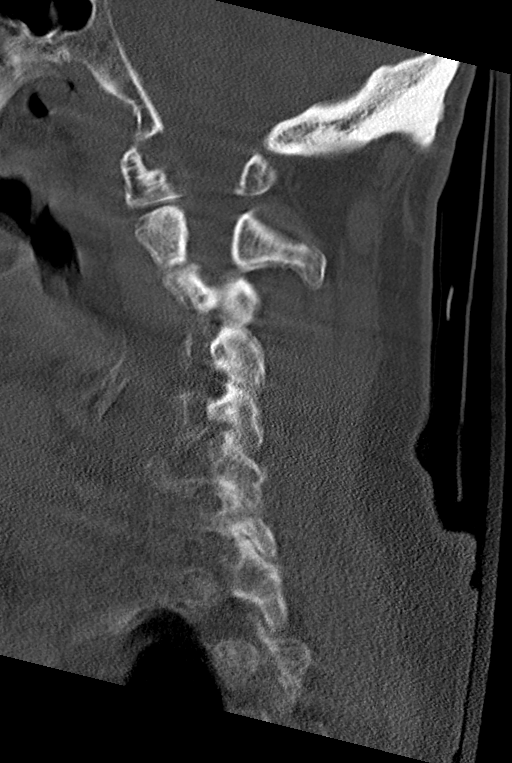
[im 51/61  bone]
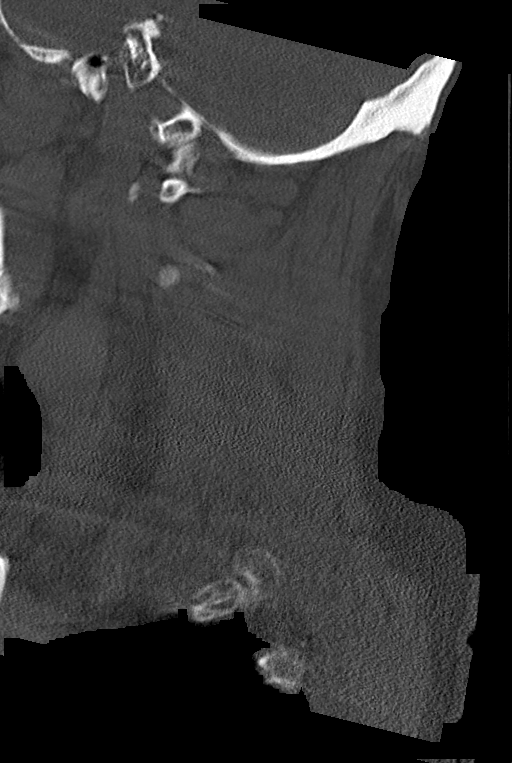

[9 of 33 positions shown; findings below may reference images not displayed]

FINDINGS: CT MAXILLOFACIAL FINDINGS

Osseous: Chronic depressed fracture of the nasal bone. Chronic
fracture of the nasal septum. These are unchanged from the prior
study.

Negative for acute facial fracture

Orbits: Negative for orbital fracture. Negative for orbital mass or
edema.

Sinuses: Mild mucosal edema maxillary sinus bilaterally. No
air-fluid level

Soft tissues: No significant soft tissue swelling.

The patient was belligerent and not holding still for the initial
scan. The patient returned to the emergency department for sedation
and the cervical and maxillofacial CT were repeated after sedation.

CT CERVICAL SPINE FINDINGS

Alignment: Normal

Skull base and vertebrae: Negative for fracture

Soft tissues and spinal canal: No acute abnormality

Disc levels: Multilevel spondylosis. Disc degeneration and prominent
spurring is present C3 through C7 causing spinal and foraminal
stenosis.

Upper chest: Lung apices clear bilaterally

Other: None
IMPRESSION: 1. Negative for acute facial fracture. Chronic fracture of the nasal
bone and nasal septum
2. Cervical spondylosis.  Negative for cervical spine fracture.

## 2021-12-06 IMAGING — CT CT HEAD W/O CM
4 series · 16 of 47 positions shown, 18 images · non-contrast
Comparison: None.

CLINICAL DATA: Head trauma.

EXAM:
CT HEAD WITHOUT CONTRAST
CT CERVICAL SPINE WITHOUT CONTRAST
TECHNIQUE: Multidetector CT imaging of the head and cervical spine was
performed following the standard protocol without intravenous
contrast. Multiplanar CT image reconstructions of the cervical spine
were also generated.

[Series 3: head bone · axial · 0.47mm/px · z∈[-123,-65]mm · 4 of 89 slices shown]
[im 9/89  bone]
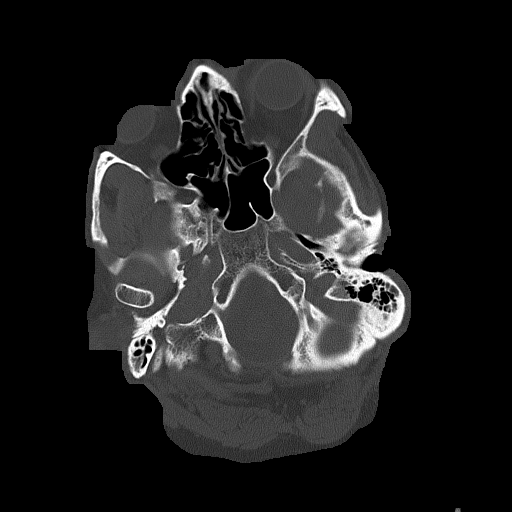
[im 17/89  bone]
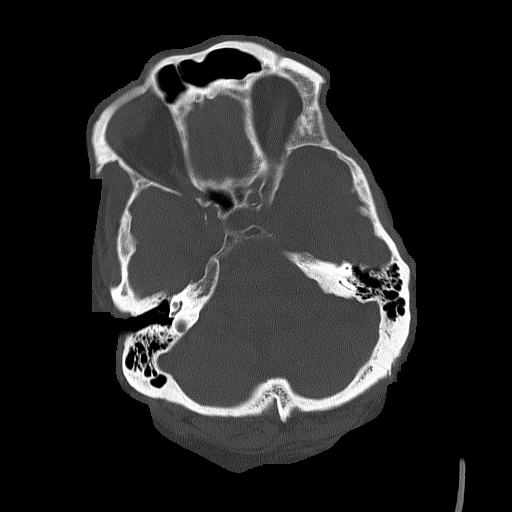
[im 30/89  bone]
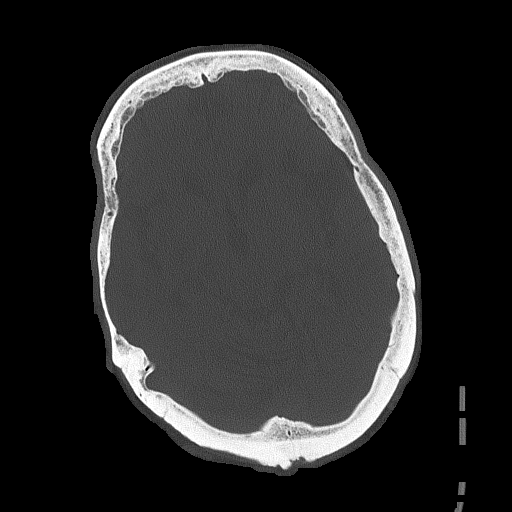
[im 38/89  bone]
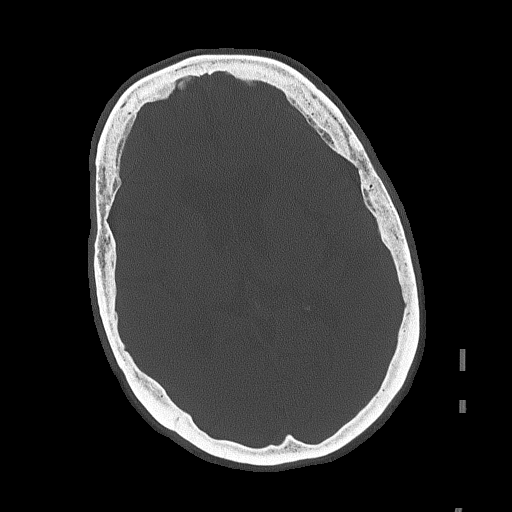

[Series 4: head wo · axial · 0.47mm/px · z∈[-119,-4]mm · 6 of 31 slices shown, 8 images]
[im 5/31  brain]
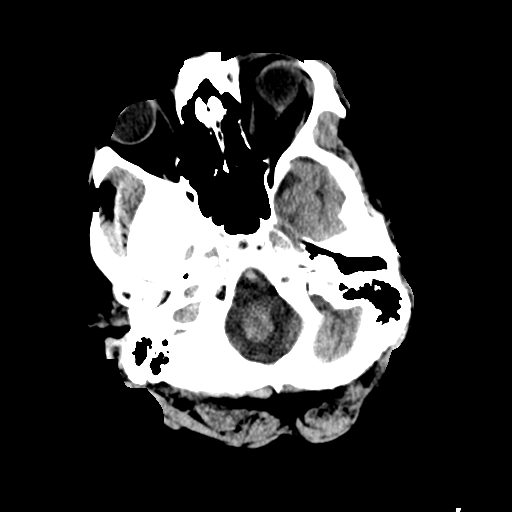
[im 5/31  bone]
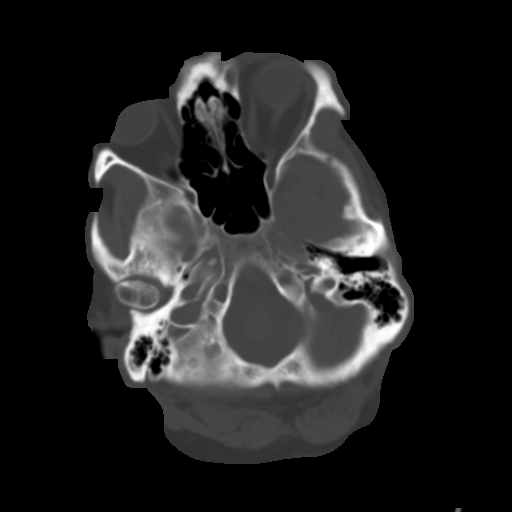
[im 9/31  brain]
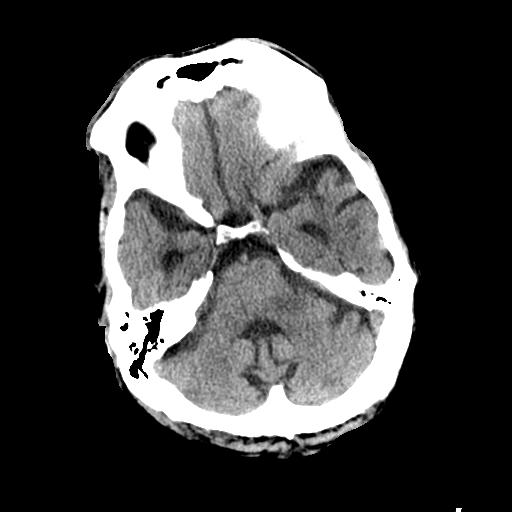
[im 13/31  brain]
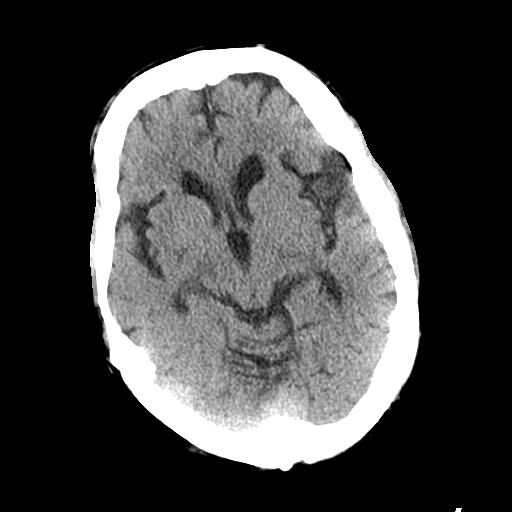
[im 18/31  brain]
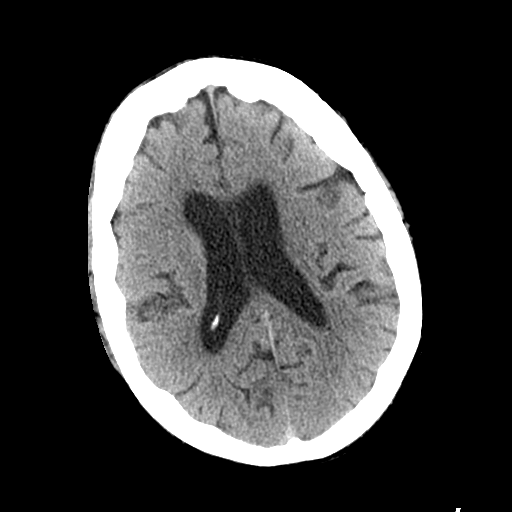
[im 22/31  brain]
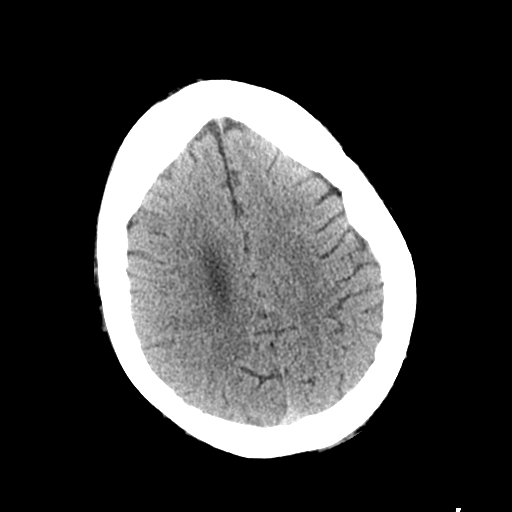
[im 22/31  bone]
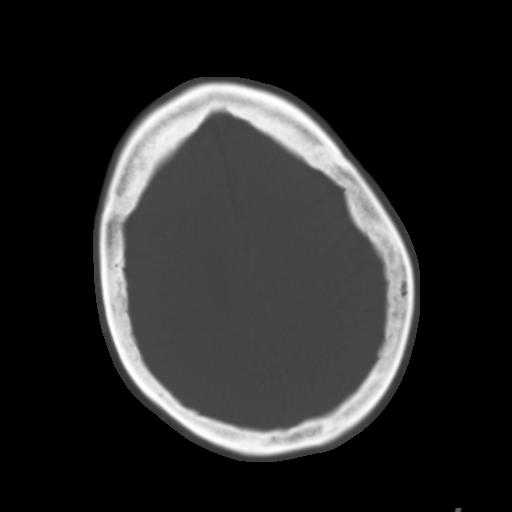
[im 26/31  brain]
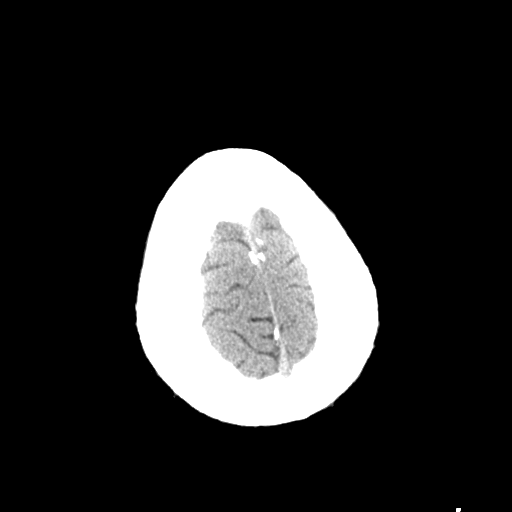

[Series 6: coronal soft tissue · coronal · 0.36mm/px · 3 of 77 slices shown]
[im 26/77  brain]
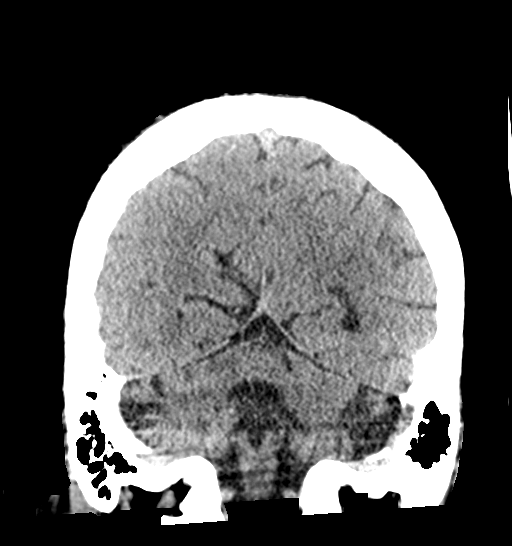
[im 34/77  brain]
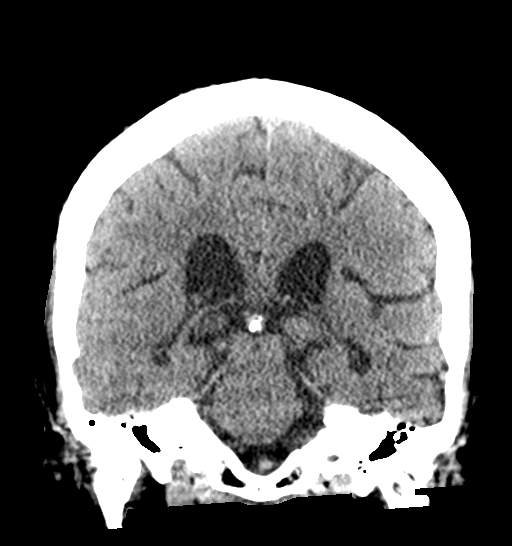
[im 43/77  brain]
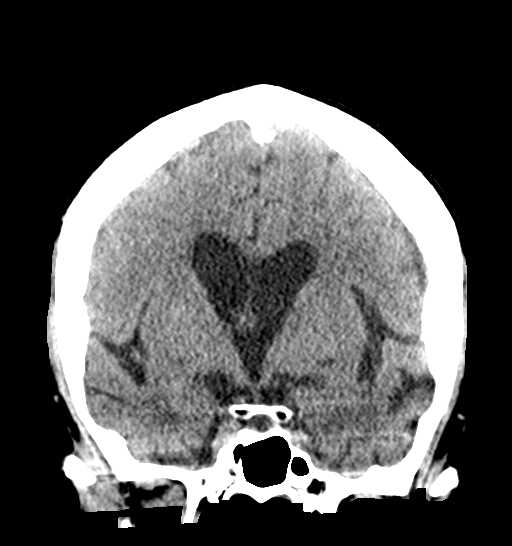

[Series 7: sagittal soft tissue · sagittal · 0.38mm/px · 3 of 60 slices shown]
[im 20/60  brain]
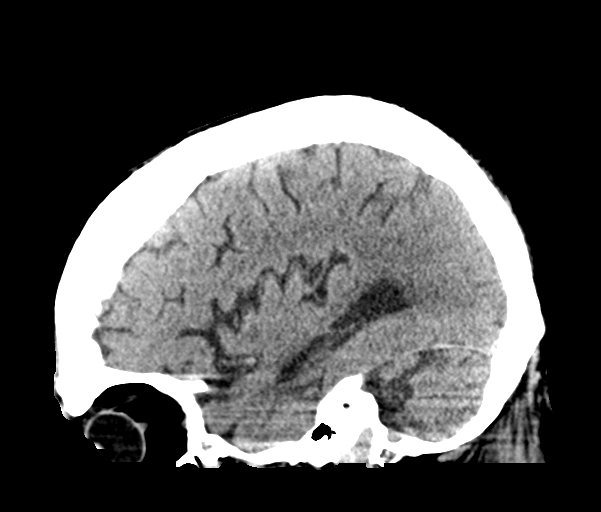
[im 30/60  brain]
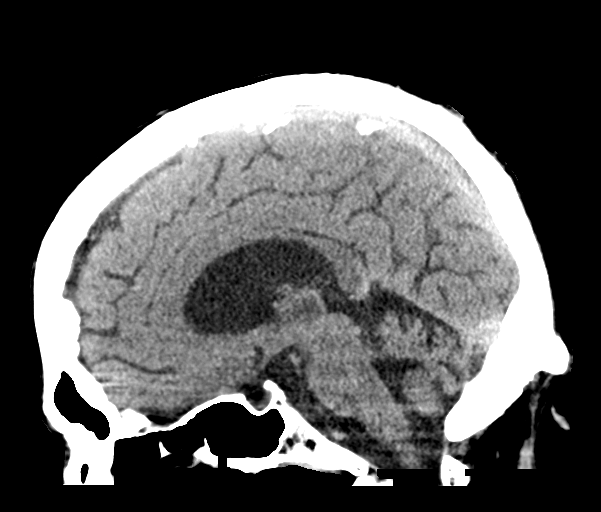
[im 40/60  brain]
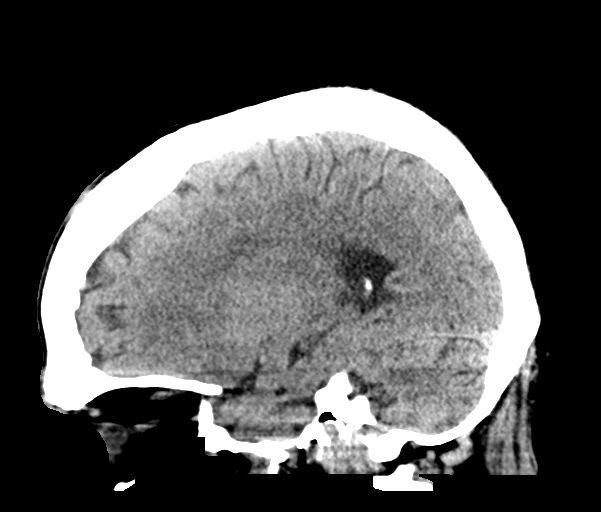

[16 of 47 positions shown; findings below may reference images not displayed]

FINDINGS: CT HEAD FINDINGS

Brain: Mild age-related atrophy and chronic microvascular ischemic
changes. There is no acute intracranial hemorrhage. No mass effect
or midline shift. No extra-axial fluid collection.

Vascular: No hyperdense vessel or unexpected calcification.

Skull: Normal. Negative for fracture or focal lesion.

Sinuses/Orbits: Old-appearing fracture of the left lamina Propecia
and left nasal bone. Mild mucoperiosteal thickening of paranasal
sinuses. No air-fluid level. The mastoid air cells are clear.

Other: None

CT CERVICAL SPINE FINDINGS

Alignment: No acute subluxation. There is straightening of normal
cervical lordosis which may be positional or due to muscle spasm.

Skull base and vertebrae: No acute fracture.

Soft tissues and spinal canal: No prevertebral fluid or swelling. No
visible canal hematoma.

Disc levels:  Multilevel degenerative changes.

Upper chest: Negative.

Other: None
IMPRESSION: 1. No acute intracranial pathology. Mild age-related atrophy and
chronic microvascular ischemic changes.
2. No acute/traumatic cervical spine pathology.

## 2021-12-06 IMAGING — CT CT CERVICAL SPINE W/O CM
3 of 4 series · 9 of 33 positions shown, 11 images · non-contrast
Comparison: None.

CLINICAL DATA: Head trauma.

EXAM:
CT HEAD WITHOUT CONTRAST
CT CERVICAL SPINE WITHOUT CONTRAST
TECHNIQUE: Multidetector CT imaging of the head and cervical spine was
performed following the standard protocol without intravenous
contrast. Multiplanar CT image reconstructions of the cervical spine
were also generated.

[Series 6: orthogonal bone · axial · 0.23mm/px · z∈[-234,-234]mm · 1 of 137 slices shown, 2 images]
[im 78/137  soft-tissue]
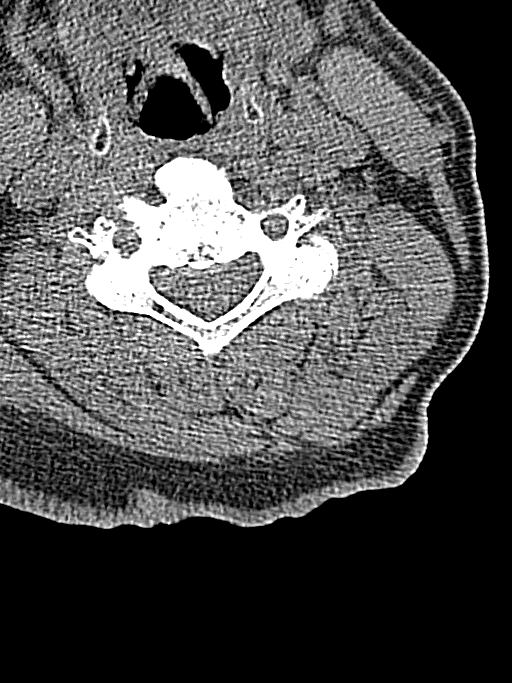
[im 78/137  bone]
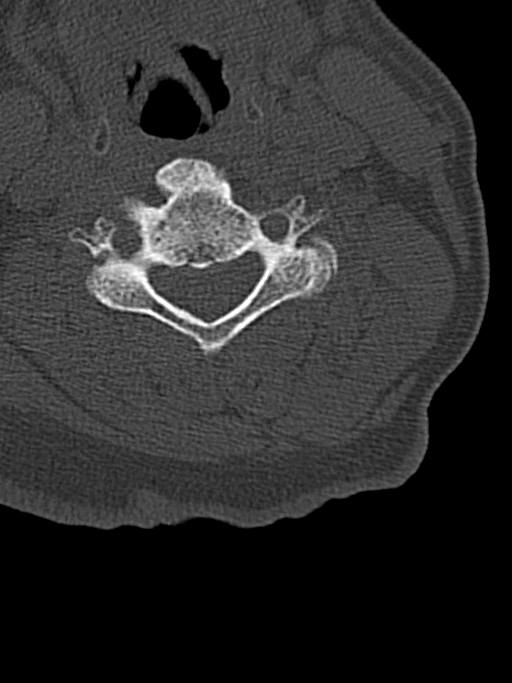

[Series 7: coronal bone · coronal · 0.23mm/px · 3 of 81 slices shown]
[im 17/81  bone]
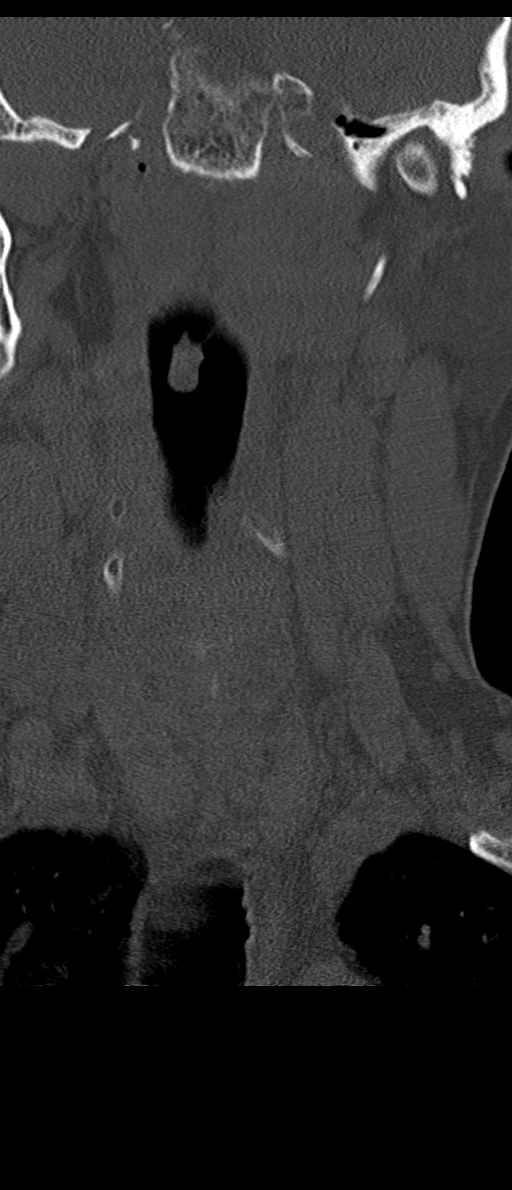
[im 33/81  bone]
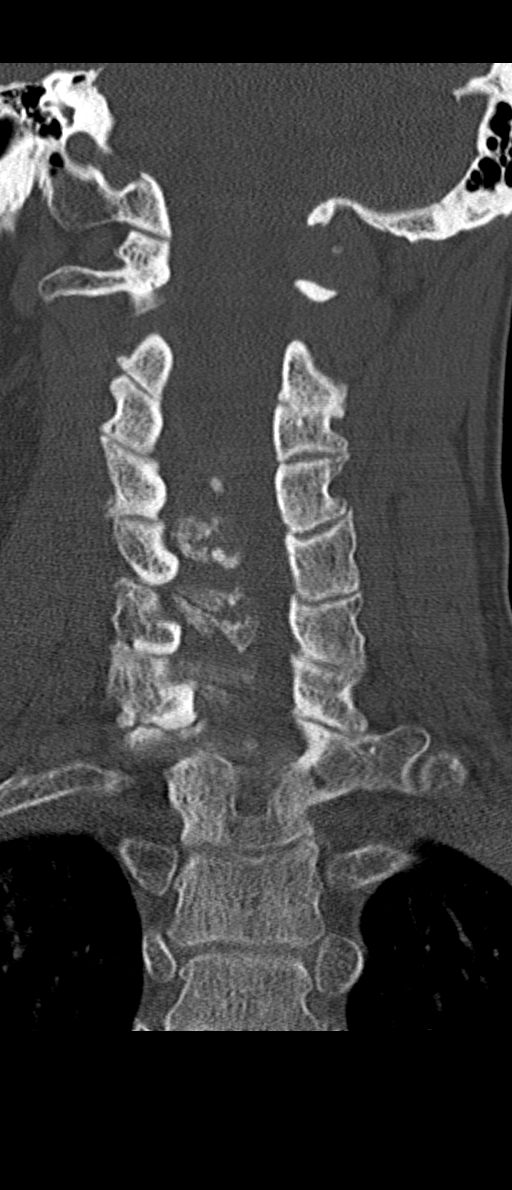
[im 48/81  bone]
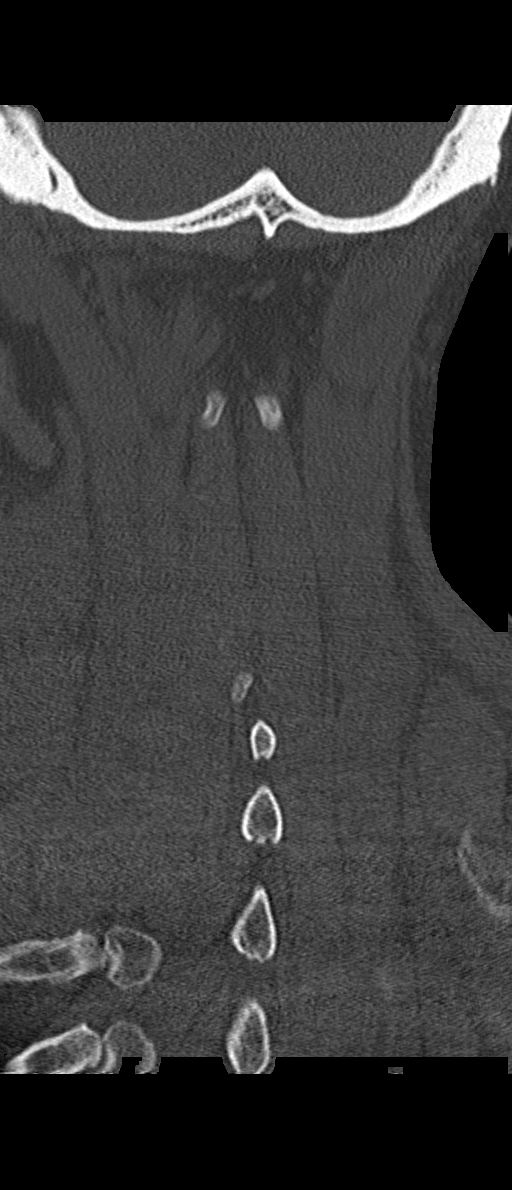

[Series 8: sagittal bone · sagittal · 0.31mm/px · 5 of 61 slices shown, 6 images]
[im 21/61  bone]
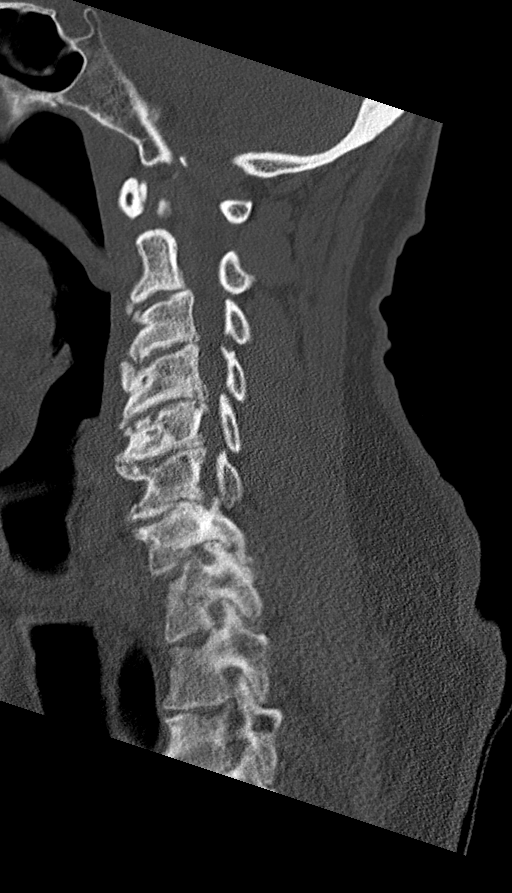
[im 26/61  bone]
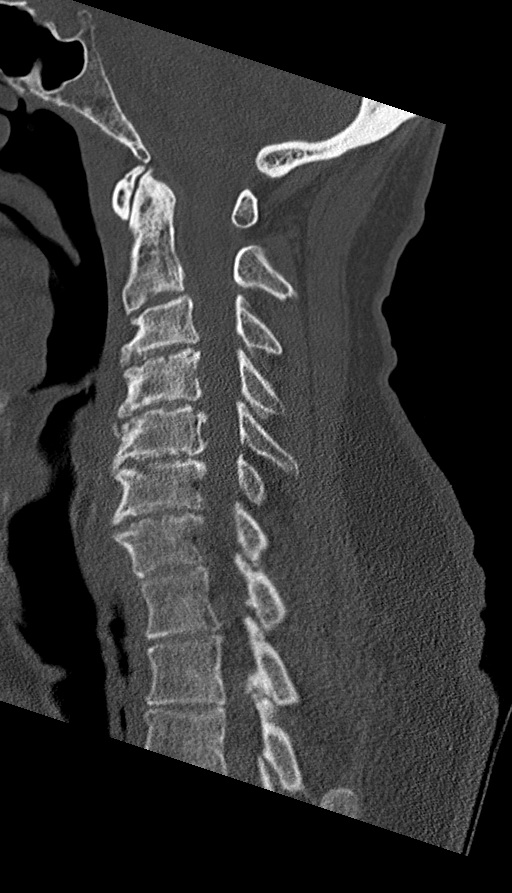
[im 31/61  soft-tissue]
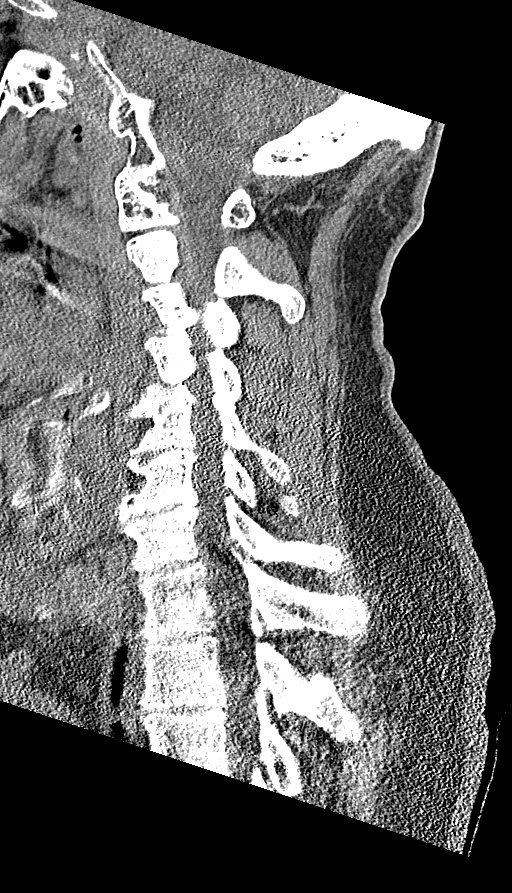
[im 31/61  bone]
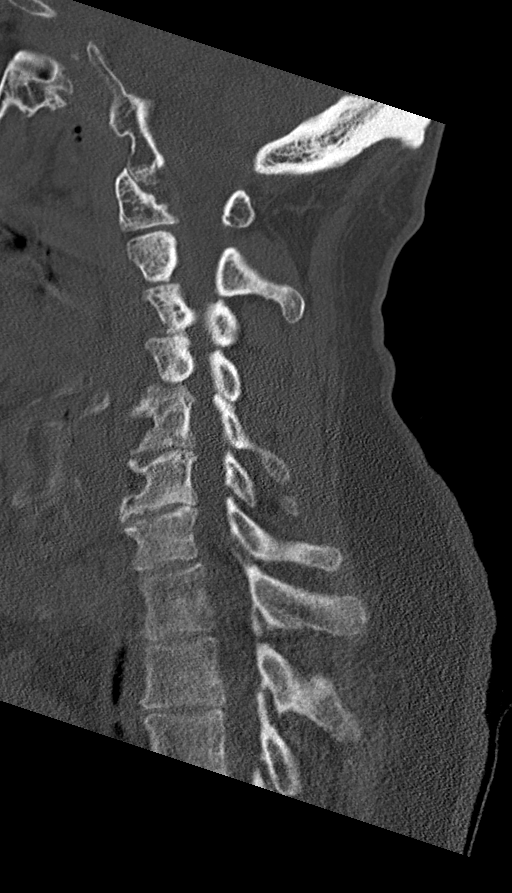
[im 36/61  bone]
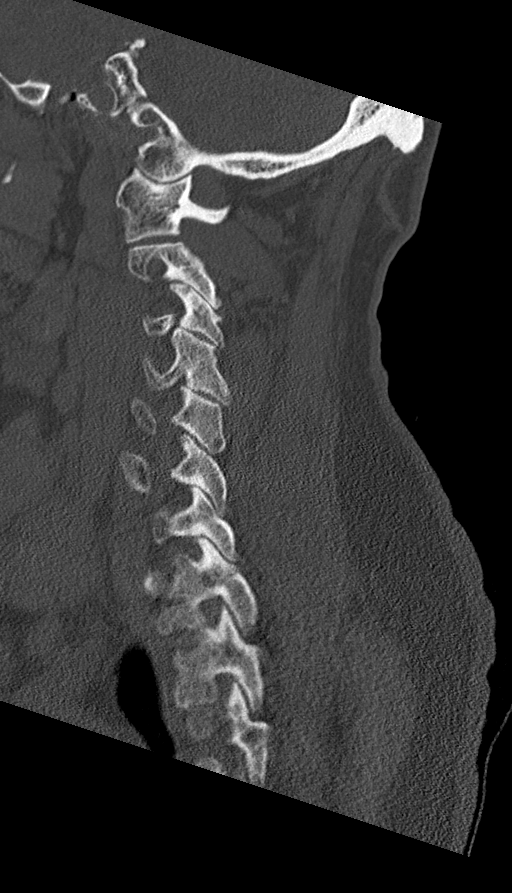
[im 41/61  bone]
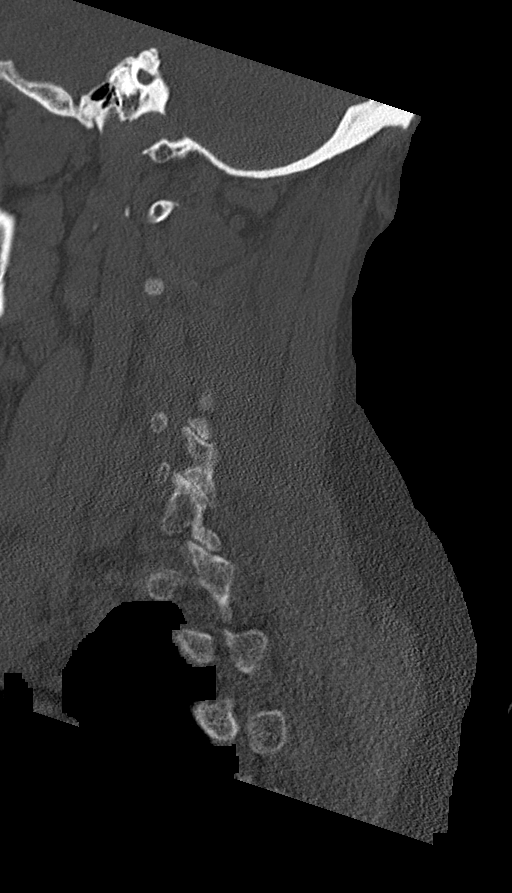

[9 of 33 positions shown; findings below may reference images not displayed]

FINDINGS: CT HEAD FINDINGS

Brain: Mild age-related atrophy and chronic microvascular ischemic
changes. There is no acute intracranial hemorrhage. No mass effect
or midline shift. No extra-axial fluid collection.

Vascular: No hyperdense vessel or unexpected calcification.

Skull: Normal. Negative for fracture or focal lesion.

Sinuses/Orbits: Old-appearing fracture of the left lamina Propecia
and left nasal bone. Mild mucoperiosteal thickening of paranasal
sinuses. No air-fluid level. The mastoid air cells are clear.

Other: None

CT CERVICAL SPINE FINDINGS

Alignment: No acute subluxation. There is straightening of normal
cervical lordosis which may be positional or due to muscle spasm.

Skull base and vertebrae: No acute fracture.

Soft tissues and spinal canal: No prevertebral fluid or swelling. No
visible canal hematoma.

Disc levels:  Multilevel degenerative changes.

Upper chest: Negative.

Other: None
IMPRESSION: 1. No acute intracranial pathology. Mild age-related atrophy and
chronic microvascular ischemic changes.
2. No acute/traumatic cervical spine pathology.

## 2022-01-08 ENCOUNTER — Emergency Department (HOSPITAL_COMMUNITY): Payer: Medicare Other

## 2022-01-08 ENCOUNTER — Encounter (HOSPITAL_COMMUNITY): Payer: Self-pay

## 2022-01-08 ENCOUNTER — Emergency Department (HOSPITAL_COMMUNITY)
Admission: EM | Admit: 2022-01-08 | Discharge: 2022-01-08 | Disposition: A | Discharge status: Skilled Nursing Facility | Payer: Medicare Other | Attending: Emergency Medicine | Admitting: Emergency Medicine

## 2022-01-08 ENCOUNTER — Other Ambulatory Visit: Payer: Self-pay

## 2022-01-08 DIAGNOSIS — Z7982 Long term (current) use of aspirin: Secondary | ICD-10-CM | POA: Diagnosis not present

## 2022-01-08 DIAGNOSIS — S0990XA Unspecified injury of head, initial encounter: Secondary | ICD-10-CM | POA: Diagnosis present

## 2022-01-08 DIAGNOSIS — W1789XA Other fall from one level to another, initial encounter: Secondary | ICD-10-CM | POA: Insufficient documentation

## 2022-01-08 DIAGNOSIS — W19XXXA Unspecified fall, initial encounter: Secondary | ICD-10-CM

## 2022-01-08 DIAGNOSIS — S0083XA Contusion of other part of head, initial encounter: Secondary | ICD-10-CM | POA: Insufficient documentation

## 2022-01-08 DIAGNOSIS — S01112A Laceration without foreign body of left eyelid and periocular area, initial encounter: Secondary | ICD-10-CM | POA: Diagnosis not present

## 2022-01-08 DIAGNOSIS — Z91040 Latex allergy status: Secondary | ICD-10-CM | POA: Insufficient documentation

## 2022-01-08 MED ORDER — LORAZEPAM 1 MG PO TABS
2.0000 mg | ORAL_TABLET | Freq: Once | ORAL | Status: AC
  Administered 2022-01-08: 2 mg via ORAL
  Filled 2022-01-08: qty 2

## 2022-01-08 NOTE — ED Notes (Signed)
Continues to thrash in bed and slide to bottom. Slid back up and environment secured. Encouraged not to get out of bed. Moans loudly, but unable to communicate if/where he hurts or what he needs assistance with.

## 2022-01-08 NOTE — ED Notes (Signed)
Very anxious, moaning and thrashing about in bed. Provider aware. Brief noted to be saturated with urine. Brief and linen changed. Warm blanket applied. Environment secured and calming factors in place. Patient remains in view of nurses station.

## 2022-01-08 NOTE — ED Notes (Signed)
Called ptar for pt no eta given  °

## 2022-01-08 NOTE — ED Notes (Signed)
Patient transported back from CT 

## 2022-01-08 NOTE — ED Notes (Signed)
Patient transported to CT 

## 2022-01-08 NOTE — ED Triage Notes (Signed)
Pt BIB EMS due to a fall. Pt has a hx of anoxic brain injury. Pt will try to escape and has frequent falls. Pt is covered in bruises. Pt had a fall last night. Pt has a right eyebrow laceration and a black eye. Pt talks a little bit and is axox2.

## 2022-01-08 NOTE — ED Provider Notes (Signed)
[facility] EMERGENCY DEPARTMENT Provider Note   CSN: [id number] Arrival date & time: 01/08/22  1456     History  Chief Complaint  Patient presents with   [name]    [name] is a 56 y.o. male.  Patient sent in from [facility] nursing facility.  Brought in by EMS.  Patient has a known history of anoxic brain injury.  Patient seen often for falls.  Was not seen at all in December for falls.  But seen several times in November.  He said that is difficult to keep control.  Falls frequently.  Patient was covered in bruises.  Said the fall was last night.  He has a right eyebrow laceration bilateral black eyes.  The eyebrow laceration is actually closed and not bleeding.  Appears that in November there was a laceration in similar area that underwent suture repair.  Patient is apparently at baseline for his mental status.      Home Medications Prior to Admission medications   Medication Sig Start Date End Date Taking? Authorizing Provider  acetaminophen (TYLENOL) 325 MG tablet Take 650 mg by mouth every 4 (four) hours as needed for mild pain.   Yes [provider]  aspirin 81 MG chewable tablet Chew 81 mg by mouth daily.   Yes [provider]  Carboxymeth-Glycerin-Polysorb (REFRESH OPTIVE ADVANCED OP) Place 1 drop into both eyes in the morning, at noon, and at bedtime.   Yes [provider]  clonazePAM (KLONOPIN) 0.5 MG tablet Take 0.5-1 mg by mouth See admin instructions. Take one tablet by mouth in the morning, then take 2 tablets by mouth at bedtime   Yes [provider]  divalproex (DEPAKOTE SPRINKLE) 125 MG capsule Take 500 mg by mouth 2 (two) times daily.  07/18/13  Yes [provider]  levothyroxine (SYNTHROID) 175 MCG tablet Take 175 mcg by mouth daily.   Yes [provider]  OLANZapine (ZYPREXA) 5 MG tablet Take 5 mg by mouth at bedtime.   Yes [provider]  pantoprazole sodium (PROTONIX) 40 mg/20  mL PACK Take 40 mg by mouth daily at 12 noon. 11/30/11  Yes Davis, Marchelle Folks, MD  polyethylene glycol (MIRALAX / GLYCOLAX) packet Take 17 g by mouth 2 (two) times daily.   Yes [provider]  senna (SENOKOT) 8.6 MG tablet Take 2 tablets by mouth 2 (two) times daily.   Yes [provider]  sertraline (ZOLOFT) 50 MG tablet Take 75 mg by mouth daily.   Yes [provider]  traZODone (DESYREL) 50 MG tablet Take 50 mg by mouth at bedtime.   Yes [provider]  traMADol (ULTRAM) 50 MG tablet Take 50 mg by mouth every 6 (six) hours as needed for moderate pain.    [provider]      Allergies    Ativan [lorazepam], Latex, Morphine and related, Penicillins, Pyridium [phenazopyridine hcl], and Soy allergy    Review of Systems   Review of Systems  Unable to perform ROS: Dementia   Physical Exam Updated Vital Signs BP 109/82    Pulse 78    Temp 97.6 F (36.4 C) (Oral)    Resp 17    Ht 1.753 m (5\' 9" )    Wt 81.6 kg    SpO2 98%    BMI 26.58 kg/m  Physical Exam Vitals and nursing note reviewed.  Constitutional:      General: He is not in acute distress.    Appearance: He  is well-developed.  HENT:     Head: Normocephalic.     Comments: Bilateral bruising around both eyes.  About a 1 cm laceration left eyebrow area.  Out laterally there is an additional 2 cm that is still closed.  Probably from the previous injury.  No active bleeding and is actually aligned and closed.  Also some bruising to the bilateral facial cheek area. Eyes:     Extraocular Movements: Extraocular movements intact.     Conjunctiva/sclera: Conjunctivae normal.     Pupils: Pupils are equal, round, and reactive to light.  Cardiovascular:     Rate and Rhythm: Normal rate and regular rhythm.     Heart sounds: No murmur heard. Pulmonary:     Effort: Pulmonary effort is normal. No respiratory distress.     Breath sounds: Normal breath sounds.  Abdominal:     Palpations: Abdomen is  soft.     Tenderness: There is no abdominal tenderness.  Musculoskeletal:        General: No swelling.     Cervical back: Neck supple.     Comments: Patient with abrasions on his lower extremities.  Scattered most of them circular in nature.  Skin:    General: Skin is warm and dry.     Capillary Refill: Capillary refill takes less than 2 seconds.  Neurological:     Mental Status: He is alert. Mental status is at baseline.     Comments: Patient will talk some.  We will move all extremities spontaneously  Psychiatric:        Mood and Affect: Mood normal.    ED Results / Procedures / Treatments   Labs (all labs ordered are listed, but only abnormal results are displayed) Labs Reviewed - No data to display  EKG None  Radiology CT Head Wo Contrast  Result Date: 01/08/2022 CLINICAL DATA:  Fall. EXAM: CT HEAD WITHOUT CONTRAST CT MAXILLOFACIAL WITHOUT CONTRAST CT CERVICAL SPINE WITHOUT CONTRAST TECHNIQUE: Multidetector CT imaging of the head, cervical spine, and maxillofacial structures were performed using the standard protocol without intravenous contrast. Multiplanar CT image reconstructions of the cervical spine and maxillofacial structures were also generated. COMPARISON:  CT head and cervical spine dated November 03, 2021. CT maxillofacial dated November 02, 2021. FINDINGS: CT HEAD FINDINGS Mild motion artifact. Brain: No evidence of acute infarction, hemorrhage, hydrocephalus, extra-axial collection or mass lesion/mass effect. Stable atrophy. Vascular: Atherosclerotic vascular calcification of the carotid siphons. No hyperdense vessel. Skull: Normal. Negative for fracture or focal lesion. Other: None. CT MAXILLOFACIAL FINDINGS Significantly limited by motion artifact. Osseous: Non-diagnostic exam for facial fracture. Chronic depressed nasal bone fractures based on head CT. Orbits: Negative by head CT. Sinuses: Grossly clear by head CT. Soft tissues: Negative. CT CERVICAL SPINE FINDINGS  Significantly limited by motion artifact. Alignment: Unchanged straightening of the normal cervical lordosis. Skull base and vertebrae: Non-diagnostic exam for cervical spine fracture. Soft tissues and spinal canal: No prevertebral fluid or swelling. No visible canal hematoma. Disc levels: Similar moderate to severe disc height loss throughout the cervical spine. Upper chest: Negative. Other: None. IMPRESSION: 1. No acute intracranial abnormality. 2. Largely non-diagnostic maxillofacial and cervical spine CTs due to motion artifact. As the patient does not follow commands, recommend repeat scanning after sedation. Electronically Signed   By: Titus Dubin M.D.   On: 01/08/2022 18:03   CT Cervical Spine Wo Contrast  Result Date: 01/08/2022 CLINICAL DATA:  Blunt trauma. Motion degraded study earlier same day. EXAM: CT CERVICAL SPINE WITHOUT CONTRAST  TECHNIQUE: Multidetector CT imaging of the cervical spine was performed without intravenous contrast. Multiplanar CT image reconstructions were also generated. RADIATION DOSE REDUCTION: This exam was performed according to the departmental dose-optimization program which includes automated exposure control, adjustment of the mA and/or kV according to patient size and/or use of iterative reconstruction technique. COMPARISON:  Motion degraded study earlier same day. FINDINGS: Alignment: No malalignment. Skull base and vertebrae: No regional fracture or focal bone lesion. Soft tissues and spinal canal: No evidence of soft tissue injury. Disc levels: The foramen magnum is widely patent. There is ordinary mild osteoarthritis of the C1-2 articulation but no encroachment upon the neural structures. C2-3: Chronic degenerative spondylosis. Mild bilateral bony foraminal narrowing. Mild facet degeneration on the left. C3-4: Chronic degenerative spondylosis. Mild bilateral bony foraminal narrowing. C4-5: Chronic degenerative spondylosis. Foraminal stenosis moderate to severe  right more than left. C5-6: Chronic degenerative spondylosis. Bilateral bony foraminal stenosis. C6-7: Chronic degenerative spondylosis. Bilateral bony foraminal stenosis right worse than left. C7-T1: Chronic spondylosis. Chronic facet degeneration worse on the right. Bilateral foraminal stenosis right worse than left. Upper chest: Negative Other: None IMPRESSION: No acute or traumatic finding. Chronic degenerative spondylosis and facet arthropathy. Foraminal stenosis throughout the region that could cause neural compression on either or both sides. Foraminal stenosis is most severe on the left at C3-4 and on the right at C4-5, C5-6, C6-7 and C7-T1. Electronically Signed   By: Nelson Chimes M.D.   On: 01/08/2022 20:47   CT Cervical Spine Wo Contrast  Result Date: 01/08/2022 CLINICAL DATA:  Fall. EXAM: CT HEAD WITHOUT CONTRAST CT MAXILLOFACIAL WITHOUT CONTRAST CT CERVICAL SPINE WITHOUT CONTRAST TECHNIQUE: Multidetector CT imaging of the head, cervical spine, and maxillofacial structures were performed using the standard protocol without intravenous contrast. Multiplanar CT image reconstructions of the cervical spine and maxillofacial structures were also generated. COMPARISON:  CT head and cervical spine dated November 03, 2021. CT maxillofacial dated November 02, 2021. FINDINGS: CT HEAD FINDINGS Mild motion artifact. Brain: No evidence of acute infarction, hemorrhage, hydrocephalus, extra-axial collection or mass lesion/mass effect. Stable atrophy. Vascular: Atherosclerotic vascular calcification of the carotid siphons. No hyperdense vessel. Skull: Normal. Negative for fracture or focal lesion. Other: None. CT MAXILLOFACIAL FINDINGS Significantly limited by motion artifact. Osseous: Non-diagnostic exam for facial fracture. Chronic depressed nasal bone fractures based on head CT. Orbits: Negative by head CT. Sinuses: Grossly clear by head CT. Soft tissues: Negative. CT CERVICAL SPINE FINDINGS Significantly limited  by motion artifact. Alignment: Unchanged straightening of the normal cervical lordosis. Skull base and vertebrae: Non-diagnostic exam for cervical spine fracture. Soft tissues and spinal canal: No prevertebral fluid or swelling. No visible canal hematoma. Disc levels: Similar moderate to severe disc height loss throughout the cervical spine. Upper chest: Negative. Other: None. IMPRESSION: 1. No acute intracranial abnormality. 2. Largely non-diagnostic maxillofacial and cervical spine CTs due to motion artifact. As the patient does not follow commands, recommend repeat scanning after sedation. Electronically Signed   By: Titus Dubin M.D.   On: 01/08/2022 18:03   CT Maxillofacial WO CM  Result Date: 01/08/2022 CLINICAL DATA:  Blunt facial trauma. Repeat exam from earlier today due to motion. EXAM: CT MAXILLOFACIAL WITHOUT CONTRAST TECHNIQUE: Multidetector CT imaging of the maxillofacial structures was performed. Multiplanar CT image reconstructions were also generated. RADIATION DOSE REDUCTION: This exam was performed according to the departmental dose-optimization program which includes automated exposure control, adjustment of the mA and/or kV according to patient size and/or use of iterative reconstruction technique. COMPARISON:  Motion limited exam earlier today. Face CT 11/02/2021 reviewed FINDINGS: Osseous: Chronic nasal bone and septal deformity, unchanged from prior CT. No acute fracture of zygomatic arches or mandible. Temporomandibular joints are congruent. Absent right frontal incisor, chronic. Orbits: No acute orbital fracture. Chronic defect of the left lamina papyracea. No globe injury. Sinuses: No sinus fracture or fluid level. Small mucous retention cysts in the maxillary sinuses. Soft tissues: No acute findings. Limited intracranial: Assessed on head CT earlier today. IMPRESSION: 1. No acute facial bone fracture. 2. 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 Electronically Signed    By: Keith Rake M.D.   On: 01/08/2022 20:49   CT Maxillofacial WO CM  Result Date: 01/08/2022 CLINICAL DATA:  Fall. EXAM: CT HEAD WITHOUT CONTRAST CT MAXILLOFACIAL WITHOUT CONTRAST CT CERVICAL SPINE WITHOUT CONTRAST TECHNIQUE: Multidetector CT imaging of the head, cervical spine, and maxillofacial structures were performed using the standard protocol without intravenous contrast. Multiplanar CT image reconstructions of the cervical spine and maxillofacial structures were also generated. COMPARISON:  CT head and cervical spine dated November 03, 2021. CT maxillofacial dated November 02, 2021. FINDINGS: CT HEAD FINDINGS Mild motion artifact. Brain: No evidence of acute infarction, hemorrhage, hydrocephalus, extra-axial collection or mass lesion/mass effect. Stable atrophy. Vascular: Atherosclerotic vascular calcification of the carotid siphons. No hyperdense vessel. Skull: Normal. Negative for fracture or focal lesion. Other: None. CT MAXILLOFACIAL FINDINGS Significantly limited by motion artifact. Osseous: Non-diagnostic exam for facial fracture. Chronic depressed nasal bone fractures based on head CT. Orbits: Negative by head CT. Sinuses: Grossly clear by head CT. Soft tissues: Negative. CT CERVICAL SPINE FINDINGS Significantly limited by motion artifact. Alignment: Unchanged straightening of the normal cervical lordosis. Skull base and vertebrae: Non-diagnostic exam for cervical spine fracture. Soft tissues and spinal canal: No prevertebral fluid or swelling. No visible canal hematoma. Disc levels: Similar moderate to severe disc height loss throughout the cervical spine. Upper chest: Negative. Other: None. IMPRESSION: 1. No acute intracranial abnormality. 2. Largely non-diagnostic maxillofacial and cervical spine CTs due to motion artifact. As the patient does not follow commands, recommend repeat scanning after sedation. Electronically Signed   By: Titus Dubin M.D.   On: 01/08/2022 18:03     Procedures Procedures    Medications Ordered in ED Medications  LORazepam (ATIVAN) tablet 2 mg (2 mg Oral Given 01/08/22 1846)    ED Course/ Medical Decision Making/ A&P                           Medical Decision Making  CT head without any significant findings.  With CT maxillofacial and CT cervical spine without good resolution due to movement.  Radiology is recommending sedation and repeating CT maxillofacial and CT cervical spine.  Clinically the laceration and left eyebrow area is closed.  Only about 1 cm fresh wound.  Is not aligned and closed and not bleeding.  The other 2 cm out further laterally is from the old injury and is still intact.  Initial CT head without any acute findings.  But due to movement they could not get a good read on the CT maxillofacial where the CT cervical spine.  They recommended some sedation.  We gave him 2 mg of Ativan and went back to CT we got good reads on the CT maxillofacial which shows no acute bony injuries.  Has old nose injury.  And also CT cervical spine has no acute injuries.  Patient stable for discharge back to  facility.   Final Clinical Impression(s) / ED Diagnoses Final diagnoses:  Fall, initial encounter  Injury of head, initial encounter  Contusion of face, initial encounter  Eyebrow laceration, left, initial encounter    Rx / DC Orders ED Discharge Orders     None         Fredia Sorrow, MD 01/08/22 2056

## 2022-01-08 NOTE — Discharge Instructions (Addendum)
CT head, CT neck, CT maxillofacial without any acute injuries.  Has a new open laceration to the left eyebrow area about 1 cm.  Where the old laceration was.  2 cm of that are still intact.  No suturing required.  Just recommend wound care.  Patient stable for discharge back to facility.  Patient was given 2 mg of Ativan orally in order to get him through the CT scans.

## 2022-01-11 ENCOUNTER — Other Ambulatory Visit: Payer: Self-pay

## 2022-01-11 ENCOUNTER — Emergency Department (HOSPITAL_COMMUNITY): Payer: Medicare Other

## 2022-01-11 ENCOUNTER — Emergency Department (HOSPITAL_COMMUNITY)
Admission: EM | Admit: 2022-01-11 | Discharge: 2022-01-12 | Disposition: A | Discharge status: Skilled Nursing Facility | Payer: Medicare Other | Attending: Emergency Medicine | Admitting: Emergency Medicine

## 2022-01-11 ENCOUNTER — Encounter (HOSPITAL_COMMUNITY): Payer: Self-pay | Admitting: Student

## 2022-01-11 DIAGNOSIS — I4891 Unspecified atrial fibrillation: Secondary | ICD-10-CM | POA: Insufficient documentation

## 2022-01-11 DIAGNOSIS — S0081XA Abrasion of other part of head, initial encounter: Secondary | ICD-10-CM | POA: Diagnosis not present

## 2022-01-11 DIAGNOSIS — W19XXXA Unspecified fall, initial encounter: Secondary | ICD-10-CM | POA: Diagnosis not present

## 2022-01-11 DIAGNOSIS — S0012XA Contusion of left eyelid and periocular area, initial encounter: Secondary | ICD-10-CM | POA: Diagnosis not present

## 2022-01-11 DIAGNOSIS — S80212A Abrasion, left knee, initial encounter: Secondary | ICD-10-CM | POA: Diagnosis not present

## 2022-01-11 DIAGNOSIS — R319 Hematuria, unspecified: Secondary | ICD-10-CM | POA: Diagnosis not present

## 2022-01-11 DIAGNOSIS — E162 Hypoglycemia, unspecified: Secondary | ICD-10-CM | POA: Diagnosis not present

## 2022-01-11 DIAGNOSIS — S80211A Abrasion, right knee, initial encounter: Secondary | ICD-10-CM | POA: Diagnosis not present

## 2022-01-11 DIAGNOSIS — R5383 Other fatigue: Secondary | ICD-10-CM | POA: Insufficient documentation

## 2022-01-11 DIAGNOSIS — S0993XA Unspecified injury of face, initial encounter: Secondary | ICD-10-CM | POA: Diagnosis present

## 2022-01-11 DIAGNOSIS — S0031XA Abrasion of nose, initial encounter: Secondary | ICD-10-CM | POA: Diagnosis not present

## 2022-01-11 DIAGNOSIS — F0390 Unspecified dementia without behavioral disturbance: Secondary | ICD-10-CM | POA: Diagnosis not present

## 2022-01-11 DIAGNOSIS — M79645 Pain in left finger(s): Secondary | ICD-10-CM | POA: Insufficient documentation

## 2022-01-11 NOTE — ED Triage Notes (Signed)
Pt BIB EMS after sustaining a fall earlier today as well as being lethargic. Pt has a small lacerations to his forehead as well as bruising on and around his eye from a past fall.  Pt is axox2.   VSS stable w/ EMS.

## 2022-01-11 NOTE — ED Provider Notes (Signed)
[facility] EMERGENCY DEPARTMENT Provider Note   CSN: [id number] Arrival date & time: 01/11/22  2213     History  Chief Complaint  Patient presents with   Fall   Fatigue    [name] is a 56 y.o. male with a hx of anoxic brain injury, dementia, quadriplegia, afib, and hyperlipidemia who presents to the ED via EMS from [facility] for evaluation of fall earlier tonight and decreased activity.   Patient states his finger hurts, does not elaborate much more. Level 5 caveat applies secondary to dementia. I called & spoke with Marliss Coots, nurse @ [facility]- she reports that the patient had a fall today shortly after 12PM which was unwitnessed. They believe he struck his face as he had some new scrapes there, but do not believe he had LOC. He has been complaining of left 5th finger pain today.This afternoon he seemed less active, he typically is crawling around and trying to get out of bed which has led to his hx or recurrent falls, was not trying to do this as much today, however he has had tramadol for his pinky pain and takes klonopin regularly which he has had today. He also had not urinated since AM shift. Given his decreased activity, decreased UOP, and his fall nursing staff contacted the call physician who wanted an in and out cath, basic labs & UA, as well as imaging of the face and left pinky, however not able to obtain work up at the facility tonight therefore they sent him to the ED. Patient is non-ambulatory and confused with slurred speech @ baseline per care staff of facility.   HPI     Home Medications Prior to Admission medications   Medication Sig Start Date End Date Taking? Authorizing Provider  acetaminophen (TYLENOL) 325 MG tablet Take 650 mg by mouth every 4 (four) hours as needed for mild pain.    [provider]  aspirin 81 MG chewable tablet Chew 81 mg by mouth daily.    [provider]  Carboxymeth-Glycerin-Polysorb (REFRESH  OPTIVE ADVANCED OP) Place 1 drop into both eyes in the morning, at noon, and at bedtime.    [provider]  clonazePAM (KLONOPIN) 0.5 MG tablet Take 0.5-1 mg by mouth See admin instructions. Take one tablet by mouth in the morning, then take 2 tablets by mouth at bedtime    [provider]  divalproex (DEPAKOTE SPRINKLE) 125 MG capsule Take 500 mg by mouth 2 (two) times daily.  07/18/13   [provider]  levothyroxine (SYNTHROID) 175 MCG tablet Take 175 mcg by mouth daily.    [provider]  OLANZapine (ZYPREXA) 5 MG tablet Take 5 mg by mouth at bedtime.    [provider]  pantoprazole sodium (PROTONIX) 40 mg/20 mL PACK Take 40 mg by mouth daily at 12 noon. 11/30/11   Ernest Haber, MD  polyethylene glycol (MIRALAX / GLYCOLAX) packet Take 17 g by mouth 2 (two) times daily.    [provider]  senna (SENOKOT) 8.6 MG tablet Take 2 tablets by mouth 2 (two) times daily.    [provider]  sertraline (ZOLOFT) 50 MG tablet Take 75 mg by mouth daily.    [provider]  traMADol (ULTRAM) 50 MG tablet Take 50 mg by mouth every 6 (six) hours as needed for moderate pain.    [provider]  traZODone (DESYREL) 50 MG tablet Take 50 mg by mouth at bedtime.  [provider]      Allergies    Ativan [lorazepam], Latex, Morphine and related, Penicillins, Pyridium [phenazopyridine hcl], and Soy allergy    Review of Systems   Review of Systems  Unable to perform ROS: Dementia   Physical Exam Updated Vital Signs BP 112/83 (BP Location: Right Arm)    Pulse 71    Temp 97.6 F (36.4 C) (Oral)    Resp 12    Ht 5\' 9"  (1.753 m)    Wt 81.6 kg    SpO2 97%    BMI 26.57 kg/m  Physical Exam Vitals and nursing note reviewed.  Constitutional:      General: He is not in acute distress.    Appearance: He is not toxic-appearing.  HENT:     Head:     Comments: Abrasion to the forehead and to the anterior nose.  Left  periorbital ecchymosis inferiorly.     Right Ear: No hemotympanum.     Left Ear: No hemotympanum.  Eyes:     Comments: PERRL.   Cardiovascular:     Rate and Rhythm: Normal rate and regular rhythm.     Pulses:          Dorsalis pedis pulses are 2+ on the right side and 2+ on the left side.  Pulmonary:     Effort: Pulmonary effort is normal.     Breath sounds: No wheezing or rales.  Chest:     Chest wall: No tenderness.  Abdominal:     Palpations: Abdomen is soft.     Tenderness: There is no abdominal tenderness. There is no guarding or rebound.  Musculoskeletal:     Cervical back: Neck supple. No spinous process tenderness.     Right lower leg: No edema.     Left lower leg: No edema.     Comments: Upper extremities: left 5th finger w/ obvious chronic appearing deformity @ the DIP joint & some discoloration with TTP. Otherwise Ues without focal tenderness.  Back: No point/focal vertebral tenderness or step off.  Lower extremities: Abrasions to the bilateral anterior knees. Able to passively range all Les joints without pain. No focal bony tenderness   Neurological:     Mental Status: He is alert.     Comments: Follows commands.     ED Results / Procedures / Treatments   Labs (all labs ordered are listed, but only abnormal results are displayed) Labs Reviewed  COMPREHENSIVE METABOLIC PANEL - Abnormal; Notable for the following components:      Result Value   Glucose, Bld 67 (*)    Calcium 8.6 (*)    Albumin 3.3 (*)    All other components within normal limits  CBC - Abnormal; Notable for the following components:   RDW 15.8 (*)    All other components within normal limits  URINALYSIS, ROUTINE W REFLEX MICROSCOPIC - Abnormal; Notable for the following components:   APPearance CLOUDY (*)    Specific Gravity, Urine >1.030 (*)    Hgb urine dipstick LARGE (*)    All other components within normal limits  URINALYSIS, MICROSCOPIC (REFLEX) - Abnormal; Notable for the following  components:   Bacteria, UA FEW (*)    All other components within normal limits  CBG MONITORING, ED    EKG None  Radiology CT Head Wo Contrast  Result Date: 01/11/2022 CLINICAL DATA:  Facial and head trauma.  Fell.  Neck pain. EXAM: CT HEAD WITHOUT CONTRAST CT MAXILLOFACIAL WITHOUT CONTRAST CT CERVICAL SPINE WITHOUT  CONTRAST TECHNIQUE: Multidetector CT imaging of the head, cervical spine, and maxillofacial structures were performed using the standard protocol without intravenous contrast. Multiplanar CT image reconstructions of the cervical spine and maxillofacial structures were also generated. RADIATION DOSE REDUCTION: This exam was performed according to the departmental dose-optimization program which includes automated exposure control, adjustment of the mA and/or kV according to patient size and/or use of iterative reconstruction technique. COMPARISON:  CT scan head, maxillofacial, cervical spine without contrast 01/08/2022 FINDINGS: CT HEAD FINDINGS Brain: Stable mild global atrophy including centrally. No asymmetry is seen concerning for acute infarct, hemorrhage or mass. Vascular: No hyperdense vessel or unexpected calcification. There calcifications in the distal vertebral arteries and both siphons. Skull: There is a new scalp hematoma in the left forehead region, small amount. The calvarium, skull base and orbits are intact. Other: None. CT MAXILLOFACIAL FINDINGS Osseous: Chronic depressed nasal bone fractures are present. 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 No acute facial fracture, mandibular fracture, or mandibular dislocation is seen. Orbits: Chronic mildly depressed fracture of the posterior left lamina papyracea. No acute traumatic or inflammatory finding. Sinuses: There is slight membrane thickening in the paranasal sinuses without fluid level. The ostiomeatal complexes are clear with intact nasal turbinates and mild membrane thickening in the  nasal passages. There is trace fluid in the left mastoid tip. The right mastoids are clear. Soft tissues: Negative. CT CERVICAL SPINE FINDINGS Alignment: Straightened and otherwise unremarkable. Skull base and vertebrae: No appreciable fracture or focal bone lesion. Soft tissues and spinal canal: No prevertebral fluid or swelling. No visible canal hematoma. Disc levels: The cervical discs are diffusely degenerated. There are prominent anterior osteophytes. 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 In addition to prominent anterior osteophytes, posterior disc osteophyte complexes are again noted causing moderate spinal canal stenosis with borderline to mild cord compression at C3-4, C4-5, C5-6 and C6-7. As before, uncinate joint and facet arthrosis is associated with multilevel severe foraminal stenosis, for the most part greater on the right except at C3-4 where it is greater on the left, at all levels is exacerbated by short pedicles in this patient. Upper chest: Negative. Other: None. IMPRESSION: 1. No acute intracranial CT findings or depressed skull fractures. Left forehead small scalp hematoma. 2. No evidence of orbital or facial fractures or hematoma with chronic depressed fractures of the nasal bone and the posteromedial left orbital wall, and sinonasal disease described above. 3. Cervical spine degenerative changes without evidence of fractures. Straightened lordosis. 4. Detailed findings discussed above. Electronically Signed   By: Almira Bar M.D.   On: 01/11/2022 23:31   CT Cervical Spine Wo Contrast  Result Date: 01/11/2022 CLINICAL DATA:  Facial and head trauma.  Fell.  Neck pain. EXAM: CT HEAD WITHOUT CONTRAST CT MAXILLOFACIAL WITHOUT CONTRAST CT CERVICAL SPINE WITHOUT CONTRAST TECHNIQUE: Multidetector CT imaging of the head, cervical spine, and maxillofacial structures were performed using the standard protocol  without intravenous contrast. Multiplanar CT image reconstructions of the cervical spine and maxillofacial structures were also generated. RADIATION DOSE REDUCTION: This exam was performed according to the departmental dose-optimization program which includes automated exposure control, adjustment of the mA and/or kV according to patient size and/or use of iterative reconstruction technique. COMPARISON:  CT scan head, maxillofacial, cervical spine without contrast 01/08/2022 FINDINGS: CT HEAD FINDINGS Brain: Stable mild global atrophy including centrally. No asymmetry is seen concerning for acute infarct, hemorrhage  or mass. Vascular: No hyperdense vessel or unexpected calcification. There calcifications in the distal vertebral arteries and both siphons. Skull: There is a new scalp hematoma in the left forehead region, small amount. The calvarium, skull base and orbits are intact. Other: None. CT MAXILLOFACIAL FINDINGS Osseous: Chronic depressed nasal bone fractures are present. 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 No acute facial fracture, mandibular fracture, or mandibular dislocation is seen. Orbits: Chronic mildly depressed fracture of the posterior left lamina papyracea. No acute traumatic or inflammatory finding. Sinuses: There is slight membrane thickening in the paranasal sinuses without fluid level. The ostiomeatal complexes are clear with intact nasal turbinates and mild membrane thickening in the nasal passages. There is trace fluid in the left mastoid tip. The right mastoids are clear. Soft tissues: Negative. CT CERVICAL SPINE FINDINGS Alignment: Straightened and otherwise unremarkable. Skull base and vertebrae: No appreciable fracture or focal bone lesion. Soft tissues and spinal canal: No prevertebral fluid or swelling. No visible canal hematoma. Disc levels: The cervical discs are diffusely degenerated. There are prominent anterior osteophytes. There are  congenitally short pedicles reducing the effective AP diameter of the thecal sac to about 7.5 mm at most levels. In addition to prominent anterior osteophytes, posterior disc osteophyte complexes are again noted causing moderate spinal canal stenosis with borderline to mild cord compression at C3-4, C4-5, C5-6 and C6-7. As before, uncinate joint and facet arthrosis is associated with multilevel severe foraminal stenosis, for the most part greater on the right except at C3-4 where it is greater on the left, at all levels is exacerbated by short pedicles in this patient. Upper chest: Negative. Other: None. IMPRESSION: 1. No acute intracranial CT findings or depressed skull fractures. Left forehead small scalp hematoma. 2. No evidence of orbital or facial fractures or hematoma with chronic depressed fractures of the nasal bone and the posteromedial left orbital wall, and sinonasal disease described above. 3. Cervical spine degenerative changes without evidence of fractures. Straightened lordosis. 4. Detailed findings discussed above. Electronically Signed   By: Telford Nab M.D.   On: 01/11/2022 23:31   DG Finger Little Left  Result Date: 01/11/2022 CLINICAL DATA:  Status post fall. EXAM: LEFT LITTLE FINGER 2+V COMPARISON:  None. FINDINGS: There is no evidence of an acute fracture or dislocation. Mild, chronic versus congenital changes are seen involving the middle phalanx of the fifth left finger. Mild diffuse soft tissue swelling is present with a small superficial soft tissue defect seen adjacent to the PIP joint. IMPRESSION: 1. No acute fracture or dislocation. 2. Mild chronic versus congenital changes involving the middle phalanx of the fifth left finger. Electronically Signed   By: Virgina Norfolk M.D.   On: 01/11/2022 23:00   CT Maxillofacial WO CM  Result Date: 01/11/2022 CLINICAL DATA:  Facial and head trauma.  Fell.  Neck pain. EXAM: CT HEAD WITHOUT CONTRAST CT MAXILLOFACIAL WITHOUT CONTRAST CT  CERVICAL SPINE WITHOUT CONTRAST TECHNIQUE: Multidetector CT imaging of the head, cervical spine, and maxillofacial structures were performed using the standard protocol without intravenous contrast. Multiplanar CT image reconstructions of the cervical spine and maxillofacial structures were also generated. RADIATION DOSE REDUCTION: This exam was performed according to the departmental dose-optimization program which includes automated exposure control, adjustment of the mA and/or kV according to patient size and/or use of iterative reconstruction technique. COMPARISON:  CT scan head, maxillofacial, cervical spine without contrast 01/08/2022 FINDINGS: CT HEAD FINDINGS Brain: Stable mild global atrophy including centrally. No asymmetry is seen  concerning for acute infarct, hemorrhage or mass. Vascular: No hyperdense vessel or unexpected calcification. There calcifications in the distal vertebral arteries and both siphons. Skull: There is a new scalp hematoma in the left forehead region, small amount. The calvarium, skull base and orbits are intact. Other: None. CT MAXILLOFACIAL FINDINGS Osseous: Chronic depressed nasal bone fractures are present. 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 No acute facial fracture, mandibular fracture, or mandibular dislocation is seen. Orbits: Chronic mildly depressed fracture of the posterior left lamina papyracea. No acute traumatic or inflammatory finding. Sinuses: There is slight membrane thickening in the paranasal sinuses without fluid level. The ostiomeatal complexes are clear with intact nasal turbinates and mild membrane thickening in the nasal passages. There is trace fluid in the left mastoid tip. The right mastoids are clear. Soft tissues: Negative. CT CERVICAL SPINE FINDINGS Alignment: Straightened and otherwise unremarkable. Skull base and vertebrae: No appreciable fracture or focal bone lesion. Soft tissues and spinal canal: No  prevertebral fluid or swelling. No visible canal hematoma. Disc levels: The cervical discs are diffusely degenerated. There are prominent anterior osteophytes. 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 In addition to prominent anterior osteophytes, posterior disc osteophyte complexes are again noted causing moderate spinal canal stenosis with borderline to mild cord compression at C3-4, C4-5, C5-6 and C6-7. As before, uncinate joint and facet arthrosis is associated with multilevel severe foraminal stenosis, for the most part greater on the right except at C3-4 where it is greater on the left, at all levels is exacerbated by short pedicles in this patient. Upper chest: Negative. Other: None. IMPRESSION: 1. No acute intracranial CT findings or depressed skull fractures. Left forehead small scalp hematoma. 2. No evidence of orbital or facial fractures or hematoma with chronic depressed fractures of the nasal bone and the posteromedial left orbital wall, and sinonasal disease described above. 3. Cervical spine degenerative changes without evidence of fractures. Straightened lordosis. 4. Detailed findings discussed above. Electronically Signed   By: Telford Nab M.D.   On: 01/11/2022 23:31    Procedures Procedures    Medications Ordered in ED Medications  sodium chloride 0.9 % bolus 1,000 mL (has no administration in time range)  sodium chloride 0.9 % bolus 500 mL (has no administration in time range)    ED Course/ Medical Decision Making/ A&P                           Medical Decision Making Patient presents to the ED for evaluation of fall as well as decreased activity & UOP, this involves an extensive number of treatment options, and are complaints that carries with it a high risk of complications and morbidity. Nontoxic, vitals unremarkable.    Additional history obtained:  Additional history obtained from [employer] External records from outside source obtained and reviewed including prior ED visits- multiple for falls.   Lab Tests:  I Ordered, reviewed, and interpreted labs, pertinent results include:  CBC: Unremarkable.  CMP: mild hypoglycemia- similar on prior labs. No additional significant electrolyte derangement. Renal function preserved.  UA: Hematuria- S/p in and out cath, no obvious UTI, specific gravity elevated.   Imaging Studies ordered:  I ordered imaging studies which included left 5th finger xray, & CT head, maxillofacial & Cspine: ,  I independently reviewed & interpreted imaging & am in agreement with radiology impression  which shows:  CT head, maxillofacial & C Spine: 1. No acute intracranial CT findings or depressed skull fractures. Left forehead small scalp hematoma. 2. No evidence of orbital or facial fractures or hematoma with chronic depressed fractures of the nasal bone and the posteromedial left orbital wall, and sinonasal disease described above. 3. Cervical spine degenerative changes without evidence of fractures. Straightened lordosis. 4. Detailed findings discussed above.  Left 5th finger xray:1. No acute fracture or dislocation. 2. Mild chronic versus congenital changes involving the middle phalanx of the fifth left finger.  ED Course:  03:30: RE-EVAL: Resting comfortably, tolerating PO juice without difficulty, no complaints.   Problem List:  - Fall-  No signs of serious head, neck or back injury- CT head & c spine without acute injury, no point/focal vertebral tenderness/step off. CT maxillofacial without acute fx. Abrasions to face do not appear to require definitive repair with sutures. Tetanus is up to date on chart review. No chest/abdominal tenderness. L 5th finger deformity is chronic, xray without acute injury, no other extremity tenderness, passively ranging all joints without significant pain.   - Decreased activity- unclear definitive etiology, no  findings of critical electrolyte derangement, renal dysfunction, or infection. No UTI. No acute abnormality on head CT. Did have tramadol today for pain in addition to his klonopin which may have been contributing. He is easily arousible and moving on stretcher in the ED nodding to yes and no questions and speaking intermittently.   - Urinary concern- in and out cath with only 300 cc of urine, patient denied urge to urinate prior to this, specific gravity elevated- suspect mild dehydration as opposed to obstructive uropathy. UA without UTI. Hematuria likely from in and out cath. Drinking plenty of fluids in the ED currently, will need continued monitoring at [facility].   - Mid hypoglycemia- hx of same, blood sugar seem to remain 55-80 on chart review with prior visits, remains 60s-70s this evening however patient has had 2 sandwiches and multiple glasses of orange juice, he is awake, alert, and seems to be at his baseline.   Overall reassuring ED work-up. Patient presentation seems fairly similar to prior. Patient is feeling well, no complaints currently, overall appears reasonable for discharge to [facility].   Findings and plan of care discussed with supervising physician Dr. Leonette Monarch who has evaluated as shareved visit &\ is in agreement.   Portions of this note were generated with [employer]. Dictation errors may occur despite best attempts at proofreading.         Final Clinical Impression(s) / ED Diagnoses Final diagnoses:  Fall, initial encounter  Hypoglycemia    Rx / DC Orders ED Discharge Orders     None         Leafy Kindle 01/12/22 0519    Luna Fuse, MD 01/21/22 [phone]

## 2022-01-12 DIAGNOSIS — S0081XA Abrasion of other part of head, initial encounter: Secondary | ICD-10-CM | POA: Diagnosis not present

## 2022-01-12 LAB — URINALYSIS, MICROSCOPIC (REFLEX): RBC / HPF: >50 RBC/hpf (ref 0–5)

## 2022-01-12 LAB — COMPREHENSIVE METABOLIC PANEL
ALT: 9 U/L (ref 0–44)
AST: 18 U/L (ref 15–41)
Albumin: 3.3 g/dL — ABNORMAL LOW (ref 3.5–5.0)
Alkaline Phosphatase: 61 U/L (ref 38–126)
Anion gap: 11 (ref 5–15)
BUN: 16 mg/dL (ref 6–20)
CO2: 25 mmol/L (ref 22–32)
Calcium: 8.6 mg/dL — ABNORMAL LOW (ref 8.9–10.3)
Chloride: 106 mmol/L (ref 98–111)
Creatinine, Ser: 0.99 mg/dL (ref 0.61–1.24)
GFR, Estimated: >60 mL/min (ref >60)
Glucose, Bld: 67 mg/dL — ABNORMAL LOW (ref 70–99)
Potassium: 4.3 mmol/L (ref 3.5–5.1)
Sodium: 142 mmol/L (ref 135–145)
Total Bilirubin: 0.6 mg/dL (ref 0.3–1.2)
Total Protein: 7.1 g/dL (ref 6.5–8.1)

## 2022-01-12 LAB — URINALYSIS, ROUTINE W REFLEX MICROSCOPIC
Bilirubin Urine: NEGATIVE
Glucose, UA: NEGATIVE mg/dL
Ketones, ur: NEGATIVE mg/dL
Leukocytes,Ua: NEGATIVE
Nitrite: NEGATIVE
Protein, ur: NEGATIVE mg/dL
Specific Gravity, Urine: >1.03 — ABNORMAL HIGH (ref 1.005–1.030)
pH: 6 (ref 5.0–8.0)

## 2022-01-12 LAB — CBC
HCT: 42.5 % (ref 39.0–52.0)
Hemoglobin: 13.1 g/dL (ref 13.0–17.0)
MCH: 28.9 pg (ref 26.0–34.0)
MCHC: 30.8 g/dL (ref 30.0–36.0)
MCV: 93.6 fL (ref 80.0–100.0)
Platelets: 170 10*3/uL (ref 150–400)
RBC: 4.54 MIL/uL (ref 4.22–5.81)
RDW: 15.8 % — ABNORMAL HIGH (ref 11.5–15.5)
WBC: 6.3 10*3/uL (ref 4.0–10.5)
nRBC: 0 % (ref 0.0–0.2)

## 2022-01-12 LAB — CBG MONITORING, ED
Glucose-Capillary: 72 mg/dL (ref 70–99)
Glucose-Capillary: 61 mg/dL — ABNORMAL LOW (ref 70–99)
Glucose-Capillary: 65 mg/dL — ABNORMAL LOW (ref 70–99)
Glucose-Capillary: 66 mg/dL — ABNORMAL LOW (ref 70–99)
Glucose-Capillary: 68 mg/dL — ABNORMAL LOW (ref 70–99)

## 2022-01-12 MED ORDER — SODIUM CHLORIDE 0.9 % IV BOLUS: 1000.0000 mL | Freq: Once | INTRAVENOUS | Status: DC

## 2022-01-12 MED ORDER — SODIUM CHLORIDE 0.9 % IV BOLUS: 500.0000 mL | Freq: Once | INTRAVENOUS | Status: DC

## 2022-01-12 NOTE — ED Notes (Signed)
CBG is 65. Pt is given orange juice to drink.

## 2022-01-12 NOTE — ED Notes (Signed)
CBG is 72.

## 2022-01-12 NOTE — ED Notes (Signed)
PTAR CALLED  °

## 2022-01-12 NOTE — ED Notes (Signed)
Report given to Tiara A, LPN of [facility]

## 2022-01-12 NOTE — ED Notes (Signed)
CBG is 68.

## 2022-01-12 NOTE — ED Notes (Signed)
CBG 61. 

## 2022-01-12 NOTE — Discharge Instructions (Addendum)
Your labs from today's visit are detailed below, specific abnormalities noted include:  - Low blood sugar- you were given food and drink for this, please monitor it closely at the care facility.  - Hematuria ( blood in your urine)- likely from in and out catheter procedure, please have this rechecked.   Your urine showed some findings of dehydration as well, be sure to stay well hydrated, this may have been why you were not urinating as much today.   CT head/neck/face and left 5th finger xray did not show acute injuries, additional findings present previously listed below.   Your detailed results are to share with your doctor   Please follow up as as soon as possible and continue to have your symptoms and abnormal labs monitored by the nursing care facility.   Return to the ER for new or worsening symptoms or any other concerns.    Results for orders placed or performed during the hospital encounter of 01/11/22  Comprehensive metabolic panel  Result Value Ref Range   Sodium 142 135 - 145 mmol/L   Potassium 4.3 3.5 - 5.1 mmol/L   Chloride 106 98 - 111 mmol/L   CO2 25 22 - 32 mmol/L   Glucose, Bld 67 (L) 70 - 99 mg/dL   BUN 16 6 - 20 mg/dL   Creatinine, Ser 0.99 0.61 - 1.24 mg/dL   Calcium 8.6 (L) 8.9 - 10.3 mg/dL   Total Protein 7.1 6.5 - 8.1 g/dL   Albumin 3.3 (L) 3.5 - 5.0 g/dL   AST 18 15 - 41 U/L   ALT 9 0 - 44 U/L   Alkaline Phosphatase 61 38 - 126 U/L   Total Bilirubin 0.6 0.3 - 1.2 mg/dL   GFR, Estimated >60 >60 mL/min   Anion gap 11 5 - 15  CBC  Result Value Ref Range   WBC 6.3 4.0 - 10.5 K/uL   RBC 4.54 4.22 - 5.81 MIL/uL   Hemoglobin 13.1 13.0 - 17.0 g/dL   HCT 42.5 39.0 - 52.0 %   MCV 93.6 80.0 - 100.0 fL   MCH 28.9 26.0 - 34.0 pg   MCHC 30.8 30.0 - 36.0 g/dL   RDW 15.8 (H) 11.5 - 15.5 %   Platelets 170 150 - 400 K/uL   nRBC 0.0 0.0 - 0.2 %  Urinalysis, Routine w reflex microscopic  Result Value Ref Range   Color, Urine YELLOW YELLOW   APPearance CLOUDY (A)  CLEAR   Specific Gravity, Urine >1.030 (H) 1.005 - 1.030   pH 6.0 5.0 - 8.0   Glucose, UA NEGATIVE NEGATIVE mg/dL   Hgb urine dipstick LARGE (A) NEGATIVE   Bilirubin Urine NEGATIVE NEGATIVE   Ketones, ur NEGATIVE NEGATIVE mg/dL   Protein, ur NEGATIVE NEGATIVE mg/dL   Nitrite NEGATIVE NEGATIVE   Leukocytes,Ua NEGATIVE NEGATIVE  Urinalysis, Microscopic (reflex)  Result Value Ref Range   RBC / HPF >50 0 - 5 RBC/hpf   WBC, UA 6-10 0 - 5 WBC/hpf   Bacteria, UA FEW (A) NONE SEEN   Squamous Epithelial / LPF 0-5 0 - 5   Mucus PRESENT   POC CBG, ED  Result Value Ref Range   Glucose-Capillary 72 70 - 99 mg/dL  CBG monitoring, ED  Result Value Ref Range   Glucose-Capillary 65 (L) 70 - 99 mg/dL   CT Head Wo Contrast  Result Date: 01/11/2022 CLINICAL DATA:  Facial and head trauma.  Fell.  Neck pain. EXAM: CT HEAD WITHOUT CONTRAST CT MAXILLOFACIAL  WITHOUT CONTRAST CT CERVICAL SPINE WITHOUT CONTRAST TECHNIQUE: Multidetector CT imaging of the head, cervical spine, and maxillofacial structures were performed using the standard protocol without intravenous contrast. Multiplanar CT image reconstructions of the cervical spine and maxillofacial structures were also generated. RADIATION DOSE REDUCTION: This exam was performed according to the departmental dose-optimization program which includes automated exposure control, adjustment of the mA and/or kV according to patient size and/or use of iterative reconstruction technique. COMPARISON:  CT scan head, maxillofacial, cervical spine without contrast 01/08/2022 FINDINGS: CT HEAD FINDINGS Brain: Stable mild global atrophy including centrally. No asymmetry is seen concerning for acute infarct, hemorrhage or mass. Vascular: No hyperdense vessel or unexpected calcification. There calcifications in the distal vertebral arteries and both siphons. Skull: There is a new scalp hematoma in the left forehead region, small amount. The calvarium, skull base and orbits are  intact. Other: None. CT MAXILLOFACIAL FINDINGS Osseous: Chronic depressed nasal bone fractures are present. 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 No acute facial fracture, mandibular fracture, or mandibular dislocation is seen. Orbits: Chronic mildly depressed fracture of the posterior left lamina papyracea. No acute traumatic or inflammatory finding. Sinuses: There is slight membrane thickening in the paranasal sinuses without fluid level. The ostiomeatal complexes are clear with intact nasal turbinates and mild membrane thickening in the nasal passages. There is trace fluid in the left mastoid tip. The right mastoids are clear. Soft tissues: Negative. CT CERVICAL SPINE FINDINGS Alignment: Straightened and otherwise unremarkable. Skull base and vertebrae: No appreciable fracture or focal bone lesion. Soft tissues and spinal canal: No prevertebral fluid or swelling. No visible canal hematoma. Disc levels: The cervical discs are diffusely degenerated. There are prominent anterior osteophytes. 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 In addition to prominent anterior osteophytes, posterior disc osteophyte complexes are again noted causing moderate spinal canal stenosis with borderline to mild cord compression at C3-4, C4-5, C5-6 and C6-7. As before, uncinate joint and facet arthrosis is associated with multilevel severe foraminal stenosis, for the most part greater on the right except at C3-4 where it is greater on the left, at all levels is exacerbated by short pedicles in this patient. Upper chest: Negative. Other: None. IMPRESSION: 1. No acute intracranial CT findings or depressed skull fractures. Left forehead small scalp hematoma. 2. No evidence of orbital or facial fractures or hematoma with chronic depressed fractures of the nasal bone and the posteromedial left orbital wall, and sinonasal  disease described above. 3. Cervical spine degenerative changes without evidence of fractures. Straightened lordosis. 4. Detailed findings discussed above. Electronically Signed   By: Telford Nab M.D.   On: 01/11/2022 23:31   CT Head Wo Contrast  Result Date: 01/08/2022 CLINICAL DATA:  Fall. EXAM: CT HEAD WITHOUT CONTRAST CT MAXILLOFACIAL WITHOUT CONTRAST CT CERVICAL SPINE WITHOUT CONTRAST TECHNIQUE: Multidetector CT imaging of the head, cervical spine, and maxillofacial structures were performed using the standard protocol without intravenous contrast. Multiplanar CT image reconstructions of the cervical spine and maxillofacial structures were also generated. COMPARISON:  CT head and cervical spine dated November 03, 2021. CT maxillofacial dated November 02, 2021. FINDINGS: CT HEAD FINDINGS Mild motion artifact. Brain: No evidence of acute infarction, hemorrhage, hydrocephalus, extra-axial collection or mass lesion/mass effect. Stable atrophy. Vascular: Atherosclerotic vascular calcification of the carotid siphons. No hyperdense vessel. Skull: Normal. Negative for fracture or focal lesion. Other: None. CT MAXILLOFACIAL FINDINGS Significantly limited by motion  artifact. Osseous: Non-diagnostic exam for facial fracture. Chronic depressed nasal bone fractures based on head CT. Orbits: Negative by head CT. Sinuses: Grossly clear by head CT. Soft tissues: Negative. CT CERVICAL SPINE FINDINGS Significantly limited by motion artifact. Alignment: Unchanged straightening of the normal cervical lordosis. Skull base and vertebrae: Non-diagnostic exam for cervical spine fracture. Soft tissues and spinal canal: No prevertebral fluid or swelling. No visible canal hematoma. Disc levels: Similar moderate to severe disc height loss throughout the cervical spine. Upper chest: Negative. Other: None. IMPRESSION: 1. No acute intracranial abnormality. 2. Largely non-diagnostic maxillofacial and cervical spine CTs due to motion  artifact. As the patient does not follow commands, recommend repeat scanning after sedation. Electronically Signed   By: Titus Dubin M.D.   On: 01/08/2022 18:03   CT Cervical Spine Wo Contrast  Result Date: 01/11/2022 CLINICAL DATA:  Facial and head trauma.  Fell.  Neck pain. EXAM: CT HEAD WITHOUT CONTRAST CT MAXILLOFACIAL WITHOUT CONTRAST CT CERVICAL SPINE WITHOUT CONTRAST TECHNIQUE: Multidetector CT imaging of the head, cervical spine, and maxillofacial structures were performed using the standard protocol without intravenous contrast. Multiplanar CT image reconstructions of the cervical spine and maxillofacial structures were also generated. RADIATION DOSE REDUCTION: This exam was performed according to the departmental dose-optimization program which includes automated exposure control, adjustment of the mA and/or kV according to patient size and/or use of iterative reconstruction technique. COMPARISON:  CT scan head, maxillofacial, cervical spine without contrast 01/08/2022 FINDINGS: CT HEAD FINDINGS Brain: Stable mild global atrophy including centrally. No asymmetry is seen concerning for acute infarct, hemorrhage or mass. Vascular: No hyperdense vessel or unexpected calcification. There calcifications in the distal vertebral arteries and both siphons. Skull: There is a new scalp hematoma in the left forehead region, small amount. The calvarium, skull base and orbits are intact. Other: None. CT MAXILLOFACIAL FINDINGS Osseous: Chronic depressed nasal bone fractures are present. 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 No acute facial fracture, mandibular fracture, or mandibular dislocation is seen. Orbits: Chronic mildly depressed fracture of the posterior left lamina papyracea. No acute traumatic or inflammatory finding. Sinuses: There is slight membrane thickening in the paranasal sinuses without fluid level. The ostiomeatal complexes are clear with intact nasal  turbinates and mild membrane thickening in the nasal passages. There is trace fluid in the left mastoid tip. The right mastoids are clear. Soft tissues: Negative. CT CERVICAL SPINE FINDINGS Alignment: Straightened and otherwise unremarkable. Skull base and vertebrae: No appreciable fracture or focal bone lesion. Soft tissues and spinal canal: No prevertebral fluid or swelling. No visible canal hematoma. Disc levels: The cervical discs are diffusely degenerated. There are prominent anterior osteophytes. 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 In addition to prominent anterior osteophytes, posterior disc osteophyte complexes are again noted causing moderate spinal canal stenosis with borderline to mild cord compression at C3-4, C4-5, C5-6 and C6-7. As before, uncinate joint and facet arthrosis is associated with multilevel severe foraminal stenosis, for the most part greater on the right except at C3-4 where it is greater on the left, at all levels is exacerbated by short pedicles in this patient. Upper chest: Negative. Other: None. IMPRESSION: 1. No acute intracranial CT findings or depressed skull fractures. Left forehead small scalp hematoma. 2. No evidence of orbital or facial fractures or hematoma with chronic depressed fractures of the nasal bone and the posteromedial left orbital wall, and sinonasal disease  described above. 3. Cervical spine degenerative changes without evidence of fractures. Straightened lordosis. 4. Detailed findings discussed above. Electronically Signed   By: Almira Bar M.D.   On: 01/11/2022 23:31   CT Cervical Spine Wo Contrast  Result Date: 01/08/2022 CLINICAL DATA:  Blunt trauma. Motion degraded study earlier same day. EXAM: CT CERVICAL SPINE WITHOUT CONTRAST TECHNIQUE: Multidetector CT imaging of the cervical spine was performed without intravenous contrast. Multiplanar CT image reconstructions  were also generated. RADIATION DOSE REDUCTION: This exam was performed according to the departmental dose-optimization program which includes automated exposure control, adjustment of the mA and/or kV according to patient size and/or use of iterative reconstruction technique. COMPARISON:  Motion degraded study earlier same day. FINDINGS: Alignment: No malalignment. Skull base and vertebrae: No regional fracture or focal bone lesion. Soft tissues and spinal canal: No evidence of soft tissue injury. Disc levels: The foramen magnum is widely patent. There is ordinary mild osteoarthritis of the C1-2 articulation but no encroachment upon the neural structures. C2-3: Chronic degenerative spondylosis. Mild bilateral bony foraminal narrowing. Mild facet degeneration on the left. C3-4: Chronic degenerative spondylosis. Mild bilateral bony foraminal narrowing. C4-5: Chronic degenerative spondylosis. Foraminal stenosis moderate to severe right more than left. C5-6: Chronic degenerative spondylosis. Bilateral bony foraminal stenosis. C6-7: Chronic degenerative spondylosis. Bilateral bony foraminal stenosis right worse than left. C7-T1: Chronic spondylosis. Chronic facet degeneration worse on the right. Bilateral foraminal stenosis right worse than left. Upper chest: Negative Other: None IMPRESSION: No acute or traumatic finding. Chronic degenerative spondylosis and facet arthropathy. Foraminal stenosis throughout the region that could cause neural compression on either or both sides. Foraminal stenosis is most severe on the left at C3-4 and on the right at C4-5, C5-6, C6-7 and C7-T1. Electronically Signed   By: Paulina Fusi M.D.   On: 01/08/2022 20:47   CT Cervical Spine Wo Contrast  Result Date: 01/08/2022 CLINICAL DATA:  Fall. EXAM: CT HEAD WITHOUT CONTRAST CT MAXILLOFACIAL WITHOUT CONTRAST CT CERVICAL SPINE WITHOUT CONTRAST TECHNIQUE: Multidetector CT imaging of the head, cervical spine, and maxillofacial structures were  performed using the standard protocol without intravenous contrast. Multiplanar CT image reconstructions of the cervical spine and maxillofacial structures were also generated. COMPARISON:  CT head and cervical spine dated November 03, 2021. CT maxillofacial dated November 02, 2021. FINDINGS: CT HEAD FINDINGS Mild motion artifact. Brain: No evidence of acute infarction, hemorrhage, hydrocephalus, extra-axial collection or mass lesion/mass effect. Stable atrophy. Vascular: Atherosclerotic vascular calcification of the carotid siphons. No hyperdense vessel. Skull: Normal. Negative for fracture or focal lesion. Other: None. CT MAXILLOFACIAL FINDINGS Significantly limited by motion artifact. Osseous: Non-diagnostic exam for facial fracture. Chronic depressed nasal bone fractures based on head CT. Orbits: Negative by head CT. Sinuses: Grossly clear by head CT. Soft tissues: Negative. CT CERVICAL SPINE FINDINGS Significantly limited by motion artifact. Alignment: Unchanged straightening of the normal cervical lordosis. Skull base and vertebrae: Non-diagnostic exam for cervical spine fracture. Soft tissues and spinal canal: No prevertebral fluid or swelling. No visible canal hematoma. Disc levels: Similar moderate to severe disc height loss throughout the cervical spine. Upper chest: Negative. Other: None. IMPRESSION: 1. No acute intracranial abnormality. 2. Largely non-diagnostic maxillofacial and cervical spine CTs due to motion artifact. As the patient does not follow commands, recommend repeat scanning after sedation. Electronically Signed   By: Obie Dredge M.D.   On: 01/08/2022 18:03   DG Finger Little Left  Result Date: 01/11/2022 CLINICAL DATA:  Status post fall. EXAM: LEFT LITTLE FINGER 2+V  COMPARISON:  None. FINDINGS: There is no evidence of an acute fracture or dislocation. Mild, chronic versus congenital changes are seen involving the middle phalanx of the fifth left finger. Mild diffuse soft tissue  swelling is present with a small superficial soft tissue defect seen adjacent to the PIP joint. IMPRESSION: 1. No acute fracture or dislocation. 2. Mild chronic versus congenital changes involving the middle phalanx of the fifth left finger. Electronically Signed   By: Virgina Norfolk M.D.   On: 01/11/2022 23:00   CT Maxillofacial WO CM  Result Date: 01/11/2022 CLINICAL DATA:  Facial and head trauma.  Fell.  Neck pain. EXAM: CT HEAD WITHOUT CONTRAST CT MAXILLOFACIAL WITHOUT CONTRAST CT CERVICAL SPINE WITHOUT CONTRAST TECHNIQUE: Multidetector CT imaging of the head, cervical spine, and maxillofacial structures were performed using the standard protocol without intravenous contrast. Multiplanar CT image reconstructions of the cervical spine and maxillofacial structures were also generated. RADIATION DOSE REDUCTION: This exam was performed according to the departmental dose-optimization program which includes automated exposure control, adjustment of the mA and/or kV according to patient size and/or use of iterative reconstruction technique. COMPARISON:  CT scan head, maxillofacial, cervical spine without contrast 01/08/2022 FINDINGS: CT HEAD FINDINGS Brain: Stable mild global atrophy including centrally. No asymmetry is seen concerning for acute infarct, hemorrhage or mass. Vascular: No hyperdense vessel or unexpected calcification. There calcifications in the distal vertebral arteries and both siphons. Skull: There is a new scalp hematoma in the left forehead region, small amount. The calvarium, skull base and orbits are intact. Other: None. CT MAXILLOFACIAL FINDINGS Osseous: Chronic depressed nasal bone fractures are present. 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 No acute facial fracture, mandibular fracture, or mandibular dislocation is seen. Orbits: Chronic mildly depressed fracture of the posterior left lamina papyracea. No acute traumatic or inflammatory finding.  Sinuses: There is slight membrane thickening in the paranasal sinuses without fluid level. The ostiomeatal complexes are clear with intact nasal turbinates and mild membrane thickening in the nasal passages. There is trace fluid in the left mastoid tip. The right mastoids are clear. Soft tissues: Negative. CT CERVICAL SPINE FINDINGS Alignment: Straightened and otherwise unremarkable. Skull base and vertebrae: No appreciable fracture or focal bone lesion. Soft tissues and spinal canal: No prevertebral fluid or swelling. No visible canal hematoma. Disc levels: The cervical discs are diffusely degenerated. There are prominent anterior osteophytes. 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 In addition to prominent anterior osteophytes, posterior disc osteophyte complexes are again noted causing moderate spinal canal stenosis with borderline to mild cord compression at C3-4, C4-5, C5-6 and C6-7. As before, uncinate joint and facet arthrosis is associated with multilevel severe foraminal stenosis, for the most part greater on the right except at C3-4 where it is greater on the left, at all levels is exacerbated by short pedicles in this patient. Upper chest: Negative. Other: None. IMPRESSION: 1. No acute intracranial CT findings or depressed skull fractures. Left forehead small scalp hematoma. 2. No evidence of orbital or facial fractures or hematoma with chronic depressed fractures of the nasal bone and the posteromedial left orbital wall, and sinonasal disease described above. 3. Cervical spine degenerative changes without evidence of fractures. Straightened lordosis. 4. Detailed findings discussed above. Electronically Signed   By: Telford Nab M.D.   On: 01/11/2022 23:31   CT Maxillofacial WO CM  Result Date: 01/08/2022 CLINICAL DATA:  Blunt facial trauma.  Repeat exam from earlier today due to motion. EXAM: CT MAXILLOFACIAL WITHOUT  CONTRAST TECHNIQUE: Multidetector CT imaging of the maxillofacial structures was performed. Multiplanar CT image reconstructions were also generated. RADIATION DOSE REDUCTION: This exam was performed according to the departmental dose-optimization program which includes automated exposure control, adjustment of the mA and/or kV according to patient size and/or use of iterative reconstruction technique. COMPARISON:  Motion limited exam earlier today. Face CT 11/02/2021 reviewed FINDINGS: Osseous: Chronic nasal bone and septal deformity, unchanged from prior CT. No acute fracture of zygomatic arches or mandible. Temporomandibular joints are congruent. Absent right frontal incisor, chronic. Orbits: No acute orbital fracture. Chronic defect of the left lamina papyracea. No globe injury. Sinuses: No sinus fracture or fluid level. Small mucous retention cysts in the maxillary sinuses. Soft tissues: No acute findings. Limited intracranial: Assessed on head CT earlier today. IMPRESSION: 1. No acute facial bone fracture. 2. 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 Electronically Signed   By: Keith Rake M.D.   On: 01/08/2022 20:49   CT Maxillofacial WO CM  Result Date: 01/08/2022 CLINICAL DATA:  Fall. EXAM: CT HEAD WITHOUT CONTRAST CT MAXILLOFACIAL WITHOUT CONTRAST CT CERVICAL SPINE WITHOUT CONTRAST TECHNIQUE: Multidetector CT imaging of the head, cervical spine, and maxillofacial structures were performed using the standard protocol without intravenous contrast. Multiplanar CT image reconstructions of the cervical spine and maxillofacial structures were also generated. COMPARISON:  CT head and cervical spine dated November 03, 2021. CT maxillofacial dated November 02, 2021. FINDINGS: CT HEAD FINDINGS Mild motion artifact. Brain: No evidence of acute infarction, hemorrhage, hydrocephalus, extra-axial collection or mass lesion/mass effect. Stable atrophy. Vascular: Atherosclerotic vascular calcification  of the carotid siphons. No hyperdense vessel. Skull: Normal. Negative for fracture or focal lesion. Other: None. CT MAXILLOFACIAL FINDINGS Significantly limited by motion artifact. Osseous: Non-diagnostic exam for facial fracture. Chronic depressed nasal bone fractures based on head CT. Orbits: Negative by head CT. Sinuses: Grossly clear by head CT. Soft tissues: Negative. CT CERVICAL SPINE FINDINGS Significantly limited by motion artifact. Alignment: Unchanged straightening of the normal cervical lordosis. Skull base and vertebrae: Non-diagnostic exam for cervical spine fracture. Soft tissues and spinal canal: No prevertebral fluid or swelling. No visible canal hematoma. Disc levels: Similar moderate to severe disc height loss throughout the cervical spine. Upper chest: Negative. Other: None. IMPRESSION: 1. No acute intracranial abnormality. 2. Largely non-diagnostic maxillofacial and cervical spine CTs due to motion artifact. As the patient does not follow commands, recommend repeat scanning after sedation. Electronically Signed   By: Titus Dubin M.D.   On: 01/08/2022 18:03

## 2022-02-10 IMAGING — CT CT CERVICAL SPINE W/O CM
4 series · 15 of 33 positions shown, 18 images · non-contrast
Comparison: Motion degraded study earlier same day.

CLINICAL DATA: Blunt trauma. Motion degraded study earlier same
day.



[Series 4: c_spine 2.0 st · axial · 0.25mm/px · z∈[-268,-138]mm · 5 of 99 slices shown, 7 images]
[im 17/99  soft-tissue]
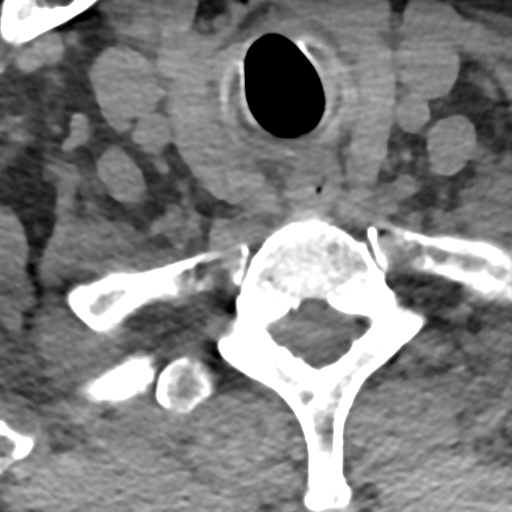
[im 17/99  bone]
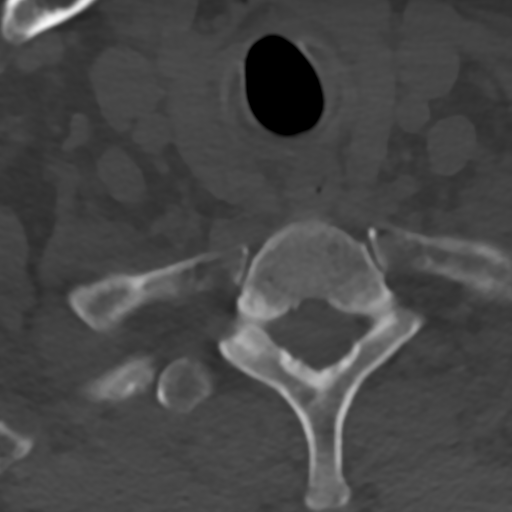
[im 33/99  bone]
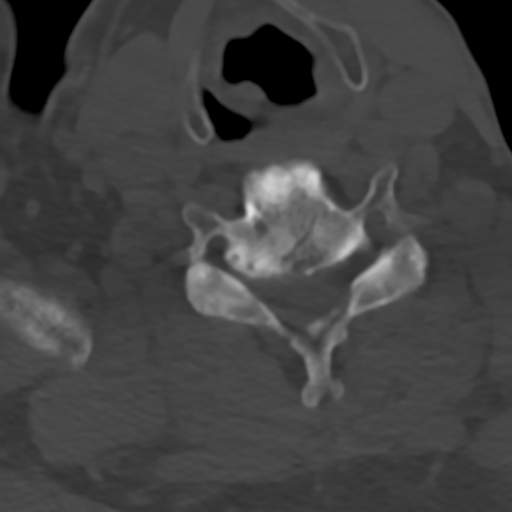
[im 50/99  bone]
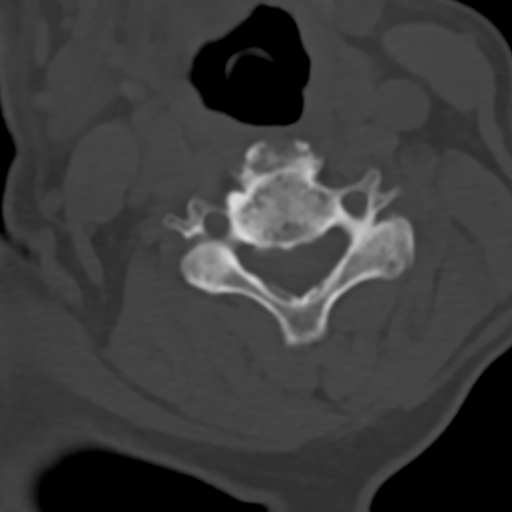
[im 66/99  bone]
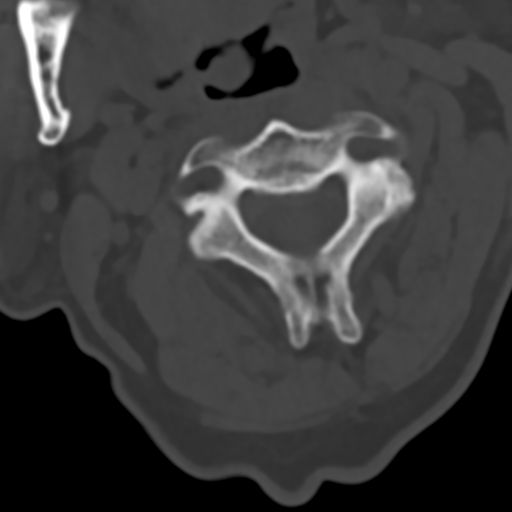
[im 82/99  soft-tissue]
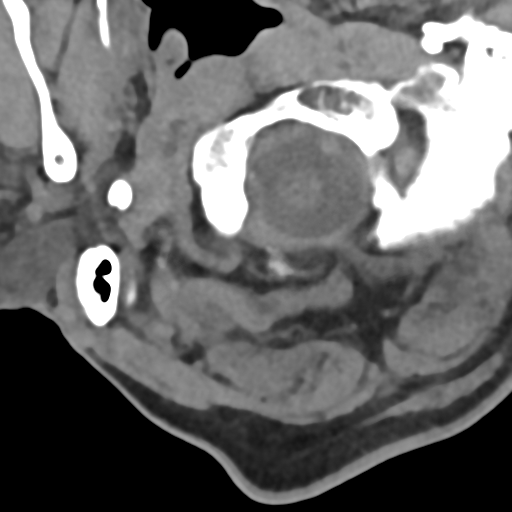
[im 82/99  bone]
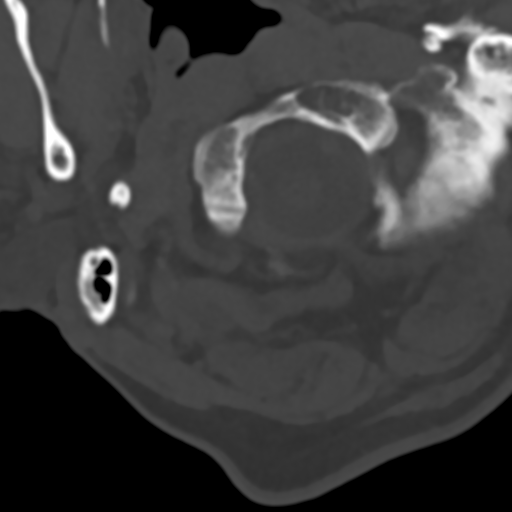

[Series 6: c_spine 2.0 sag bone · sagittal · 0.36mm/px · 5 of 82 slices shown, 6 images]
[im 28/82  bone]
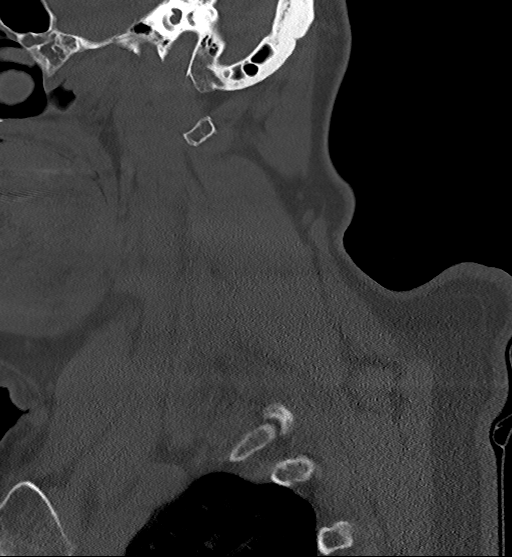
[im 34/82  bone]
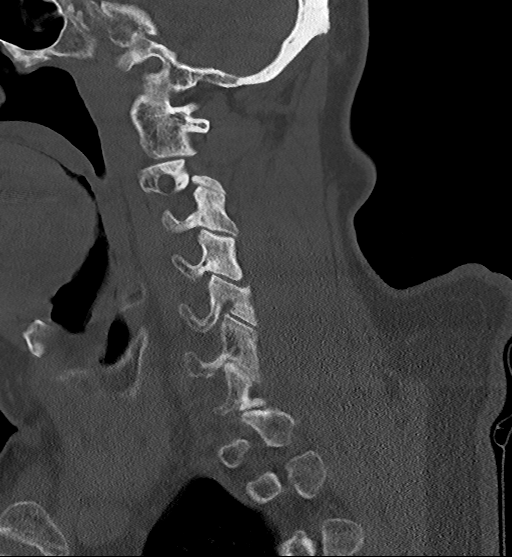
[im 41/82  soft-tissue]
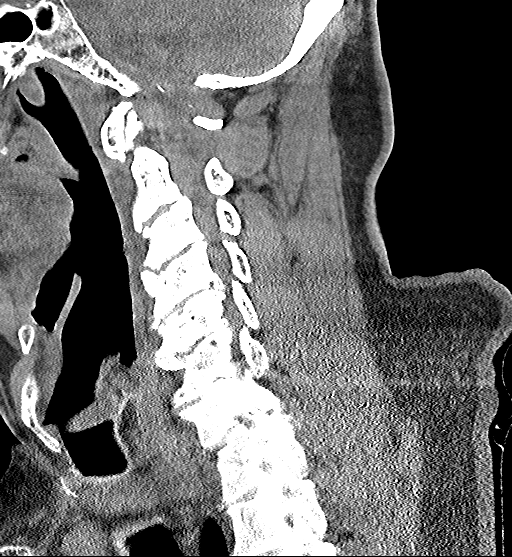
[im 41/82  bone]
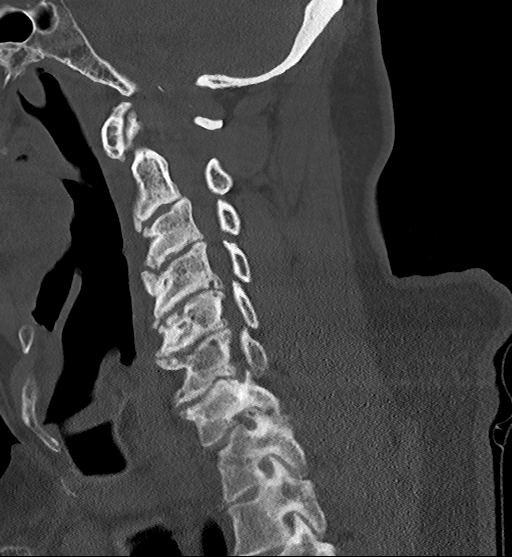
[im 48/82  bone]
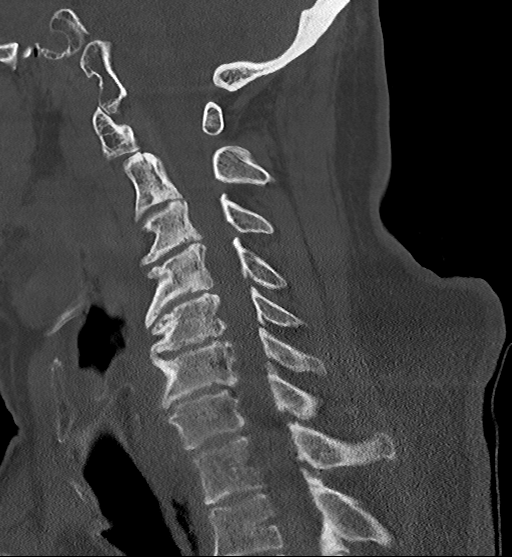
[im 55/82  bone]
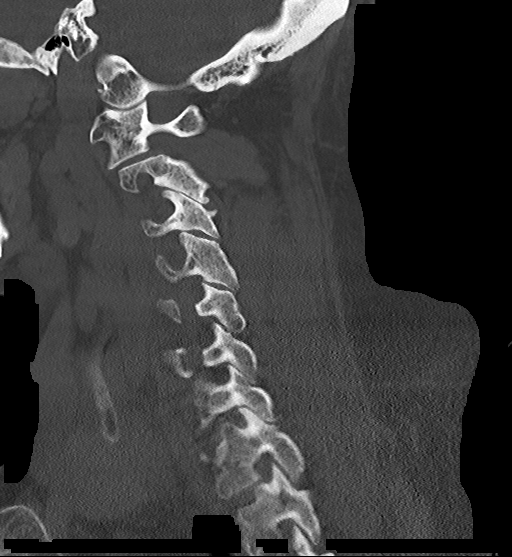

[Series 7: c_spine 2.0 cor bone · coronal · 0.37mm/px · 3 of 93 slices shown]
[im 19/93  bone]
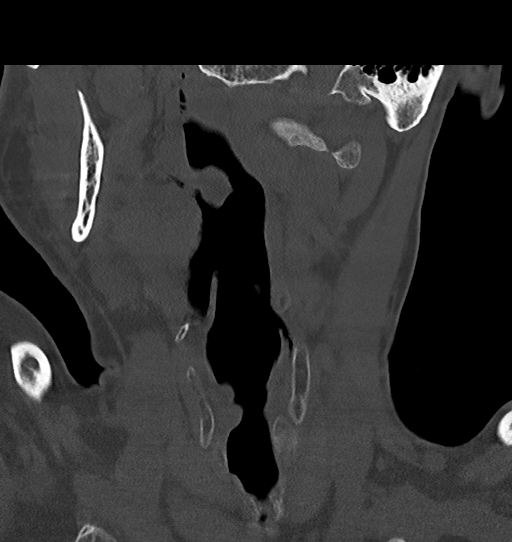
[im 37/93  bone]
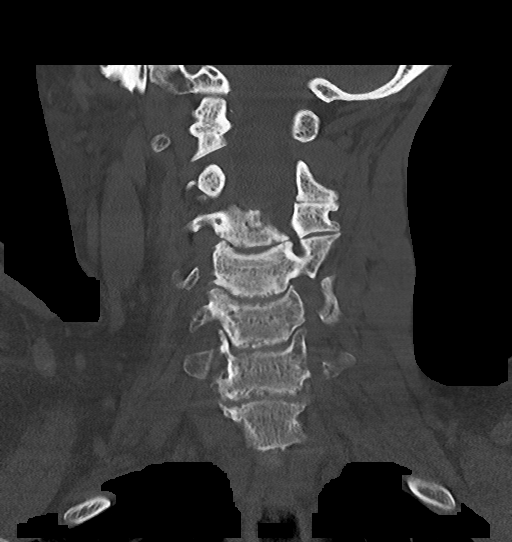
[im 56/93  bone]
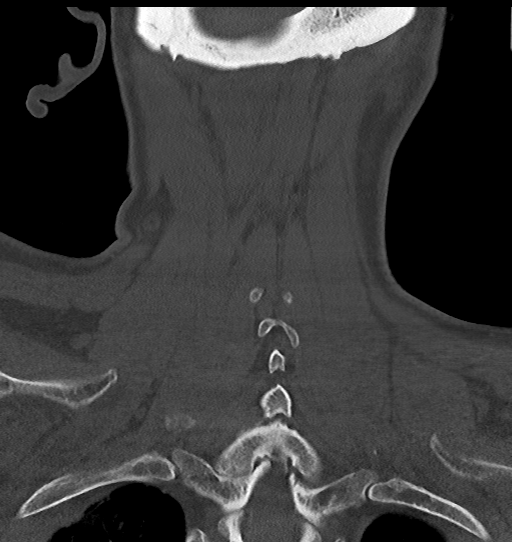

[Series 10: c_spine 2.0 orthogonals · axial · 0.27mm/px · z∈[-290,-268]mm · 2 of 93 slices shown]
[im 16/93  bone]
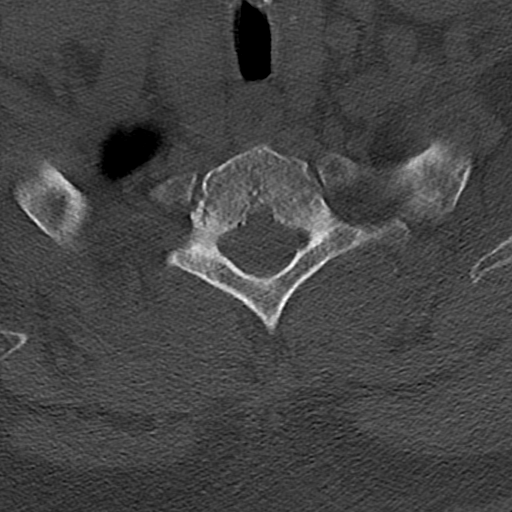
[im 31/93  bone]
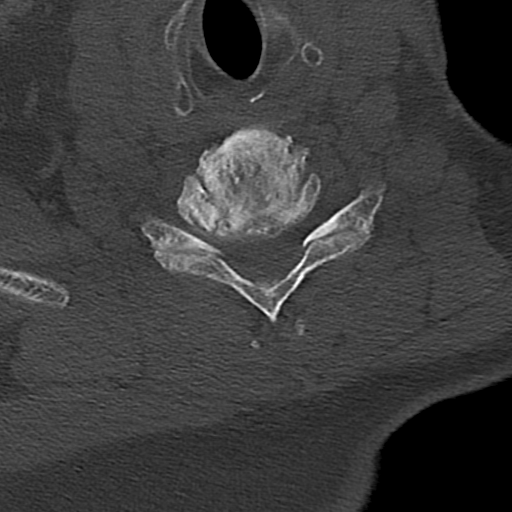

[15 of 33 positions shown; findings below may reference images not displayed]

FINDINGS: Alignment: No malalignment.

Skull base and vertebrae: No regional fracture or focal bone lesion.

Soft tissues and spinal canal: No evidence of soft tissue injury.

Disc levels: The foramen magnum is widely patent. There is ordinary
mild osteoarthritis of the C1-2 articulation but no encroachment
upon the neural structures.

C2-3: Chronic degenerative spondylosis. Mild bilateral bony
foraminal narrowing. Mild facet degeneration on the left.

C3-4: Chronic degenerative spondylosis. Mild bilateral bony
foraminal narrowing.

C4-5: Chronic degenerative spondylosis. Foraminal stenosis moderate
to severe right more than left.

C5-6: Chronic degenerative spondylosis. Bilateral bony foraminal
stenosis.

C6-7: Chronic degenerative spondylosis. Bilateral bony foraminal
stenosis right worse than left.

C7-T1: Chronic spondylosis. Chronic facet degeneration worse on the
right. Bilateral foraminal stenosis right worse than left.

Upper chest: Negative

Other: None
IMPRESSION: No acute or traumatic finding. Chronic degenerative spondylosis and
facet arthropathy. Foraminal stenosis throughout the region that
could cause neural compression on either or both sides. Foraminal
stenosis is most severe on the left at C3-4 and on the right at
C4-5, C5-6, C6-7 and C7-T1.

## 2022-02-10 IMAGING — CT CT HEAD W/O CM
4 series · 15 of 47 positions shown, 17 images · non-contrast
Comparison: CT head and cervical spine dated November 03, 2021. CT
maxillofacial dated November 02, 2021.

CLINICAL DATA: Fall.

EXAM:
CT HEAD WITHOUT CONTRAST
CT MAXILLOFACIAL WITHOUT CONTRAST
CT CERVICAL SPINE WITHOUT CONTRAST
TECHNIQUE: Multidetector CT imaging of the head, cervical spine, and
maxillofacial structures were performed using the standard protocol
without intravenous contrast. Multiplanar CT image reconstructions
of the cervical spine and maxillofacial structures were also
generated.

[Series 3: head without · axial · non-contrast · 0.47mm/px · z∈[-108,+22]mm · 7 of 36 slices shown, 9 images]
[im 5/36  brain]
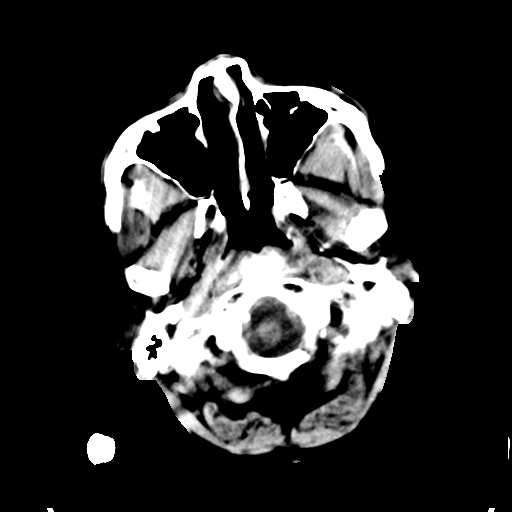
[im 5/36  bone]
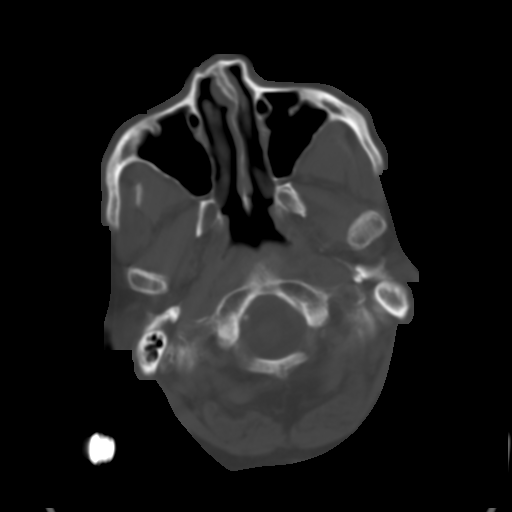
[im 9/36  brain]
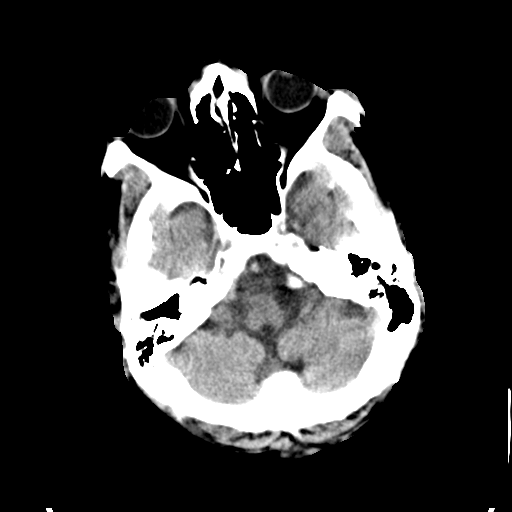
[im 14/36  brain]
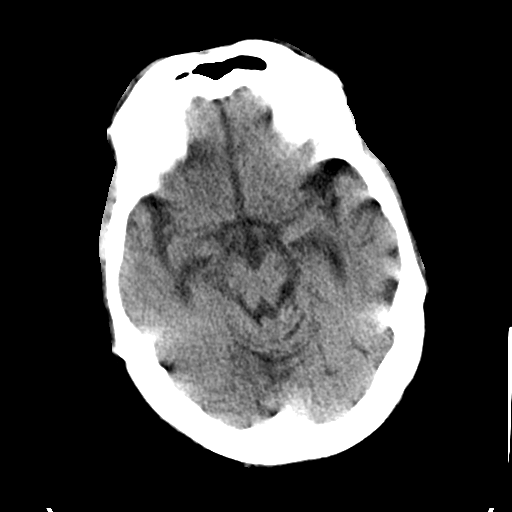
[im 18/36  brain]
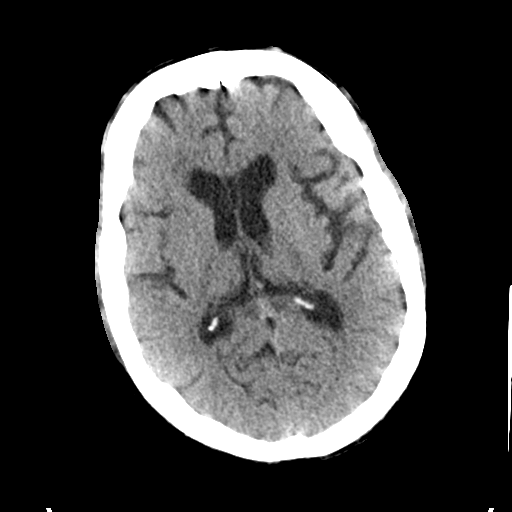
[im 22/36  brain]
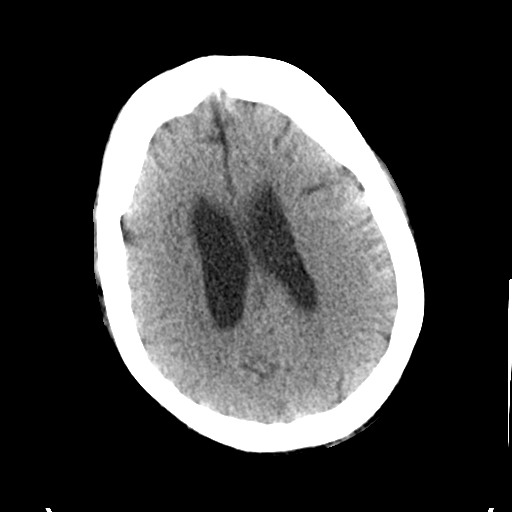
[im 22/36  bone]
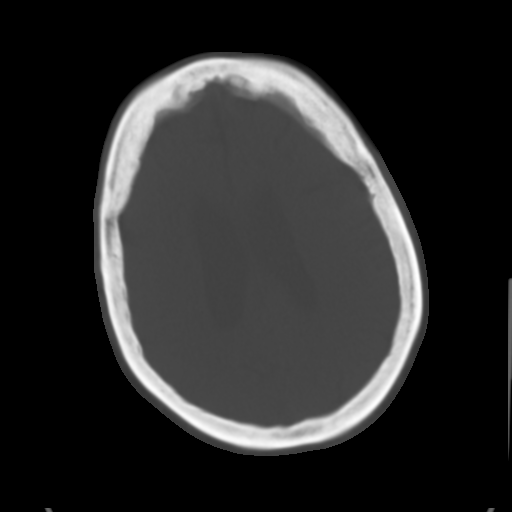
[im 27/36  brain]
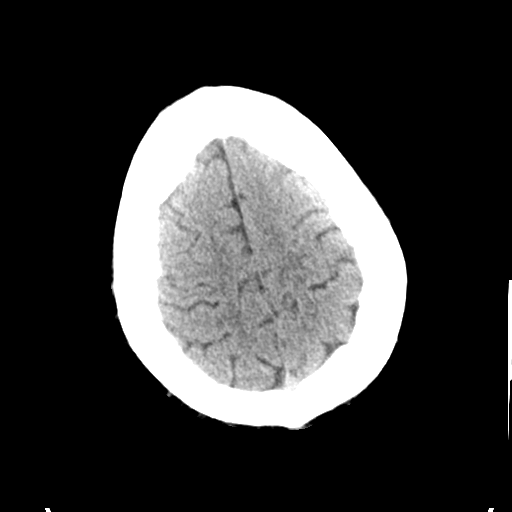
[im 31/36  brain]
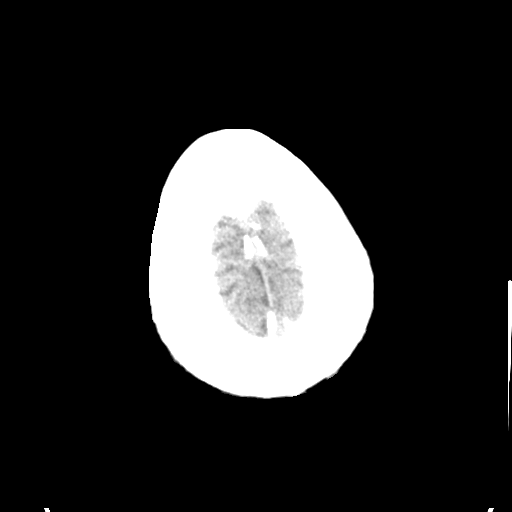

[Series 4: head bone · axial · 0.47mm/px · z∈[-112,-94]mm · 2 of 90 slices shown]
[im 9/90  bone]
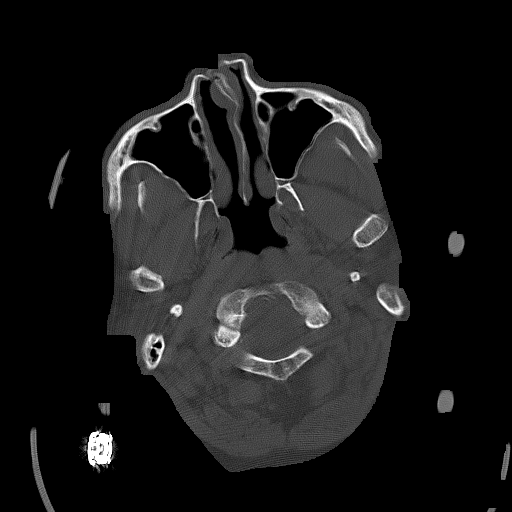
[im 18/90  bone]
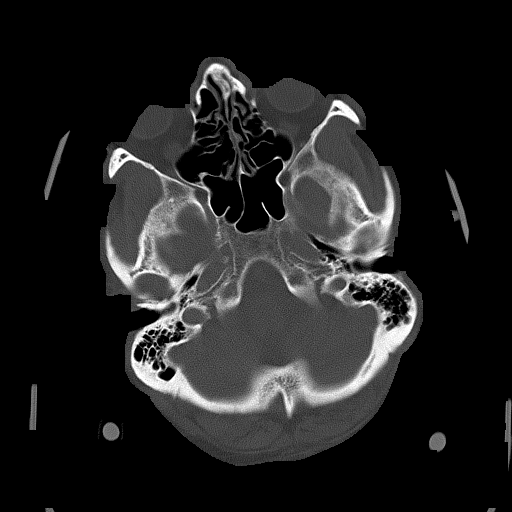

[Series 5: head without cor · coronal · non-contrast · 0.36mm/px · 3 of 70 slices shown]
[im 24/70  brain]
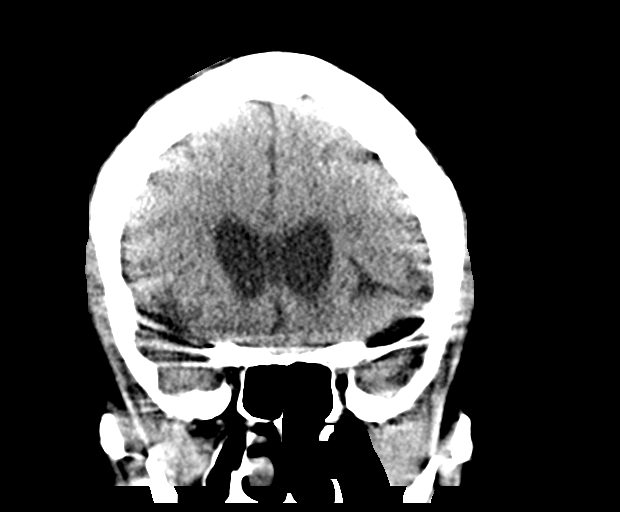
[im 31/70  brain]
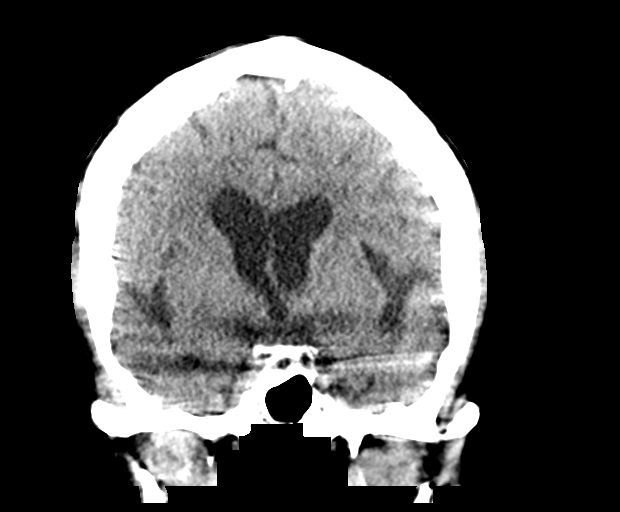
[im 39/70  brain]
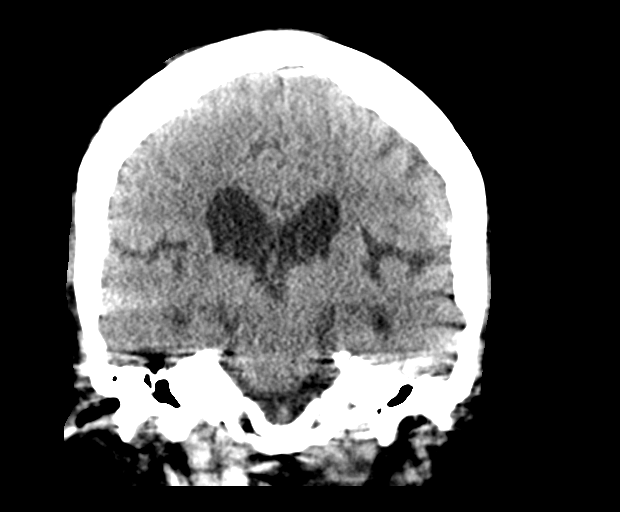

[Series 6: head without sag · sagittal · non-contrast · 0.35mm/px · 3 of 67 slices shown]
[im 23/67  brain]
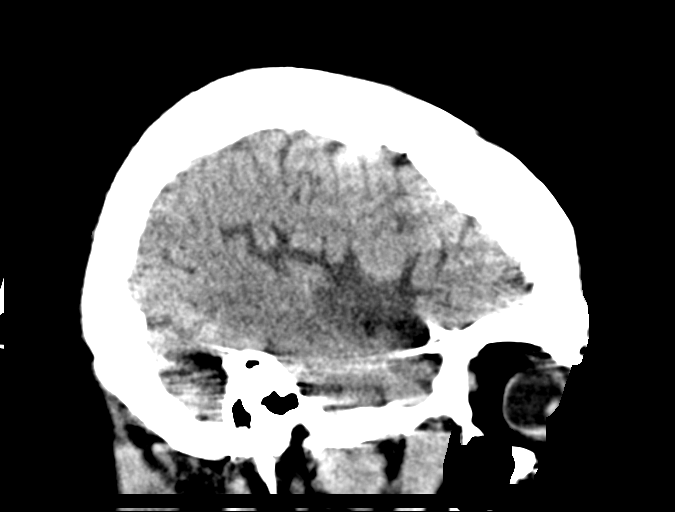
[im 34/67  brain]
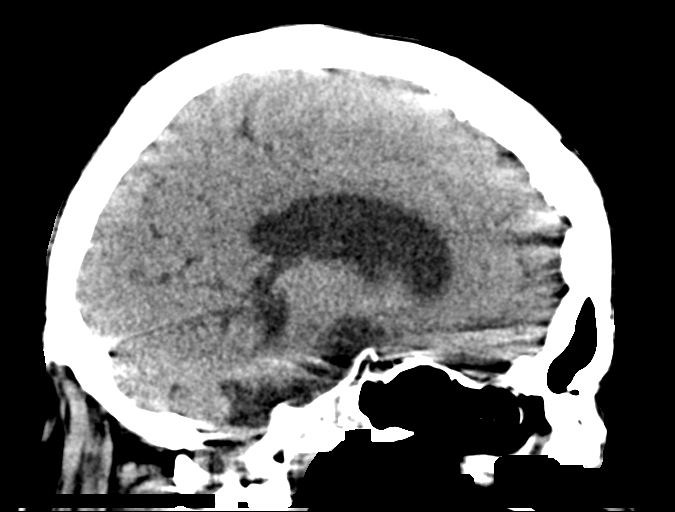
[im 45/67  brain]
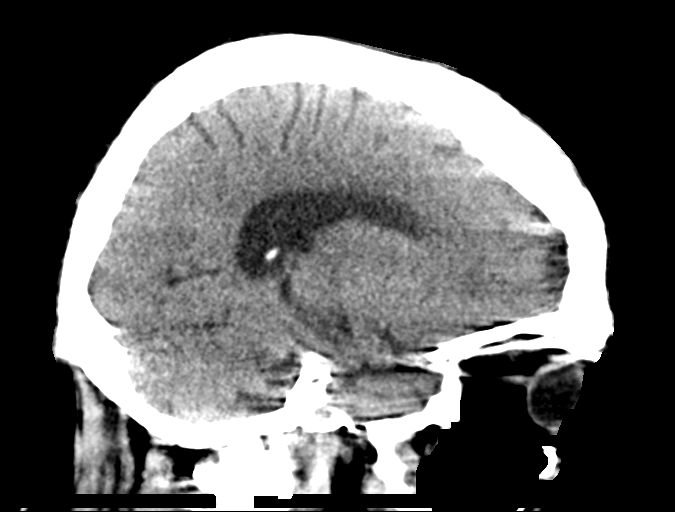

[15 of 47 positions shown; findings below may reference images not displayed]

FINDINGS: CT HEAD FINDINGS

Mild motion artifact.

Brain: No evidence of acute infarction, hemorrhage, hydrocephalus,
extra-axial collection or mass lesion/mass effect. Stable atrophy.

Vascular: Atherosclerotic vascular calcification of the carotid
siphons. No hyperdense vessel.

Skull: Normal. Negative for fracture or focal lesion.

Other: None.

CT MAXILLOFACIAL FINDINGS

Significantly limited by motion artifact.

Osseous: Non-diagnostic exam for facial fracture. Chronic depressed
nasal bone fractures based on head CT.

Orbits: Negative by head CT.

Sinuses: Grossly clear by head CT.

Soft tissues: Negative.

CT CERVICAL SPINE FINDINGS

Significantly limited by motion artifact.

Alignment: Unchanged straightening of the normal cervical lordosis.

Skull base and vertebrae: Non-diagnostic exam for cervical spine
fracture.

Soft tissues and spinal canal: No prevertebral fluid or swelling. No
visible canal hematoma.

Disc levels: Similar moderate to severe disc height loss throughout
the cervical spine.

Upper chest: Negative.

Other: None.
IMPRESSION: 1. No acute intracranial abnormality.
2. Largely non-diagnostic maxillofacial and cervical spine CTs due
to motion artifact. As the patient does not follow commands,
recommend repeat scanning after sedation.

## 2022-02-10 IMAGING — CT CT CERVICAL SPINE W/O CM
3 of 4 series · 10 of 33 positions shown, 12 images · non-contrast
Comparison: CT head and cervical spine dated November 03, 2021. CT
maxillofacial dated November 02, 2021.

CLINICAL DATA: Fall.

EXAM:
CT HEAD WITHOUT CONTRAST
CT MAXILLOFACIAL WITHOUT CONTRAST
CT CERVICAL SPINE WITHOUT CONTRAST
TECHNIQUE: Multidetector CT imaging of the head, cervical spine, and
maxillofacial structures were performed using the standard protocol
without intravenous contrast. Multiplanar CT image reconstructions
of the cervical spine and maxillofacial structures were also
generated.

[Series 3: c_spine 2.0 st · axial · 0.30mm/px · z∈[-183,-123]mm · 2 of 92 slices shown, 3 images]
[im 31/92  soft-tissue]
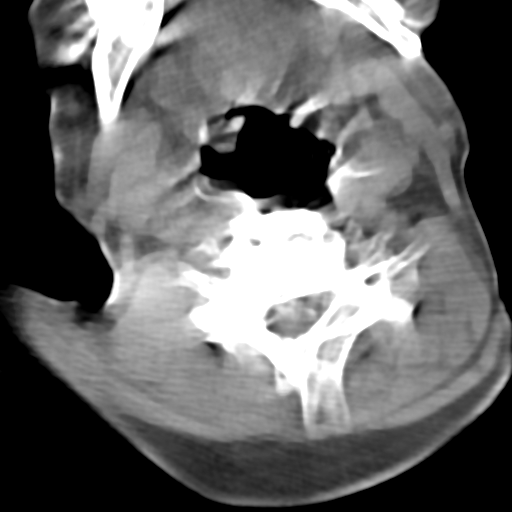
[im 31/92  bone]
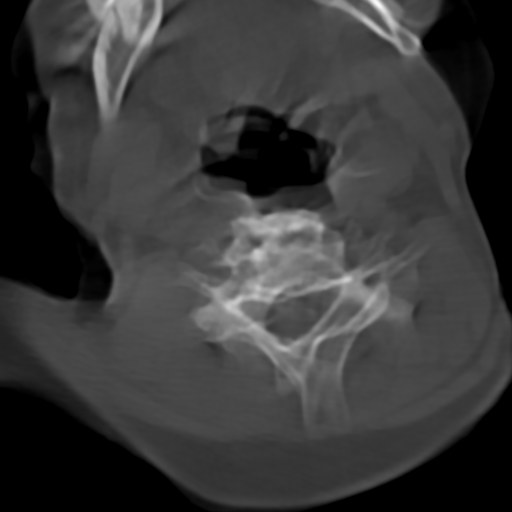
[im 61/92  bone]
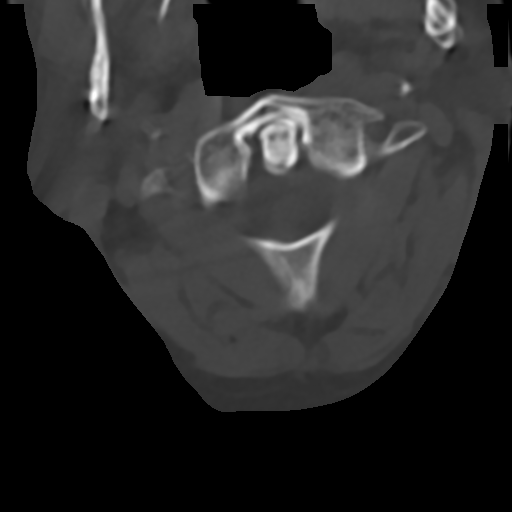

[Series 6: c_spine 2.0 sag bone · sagittal · 0.30mm/px · 5 of 61 slices shown, 6 images]
[im 21/61  bone]
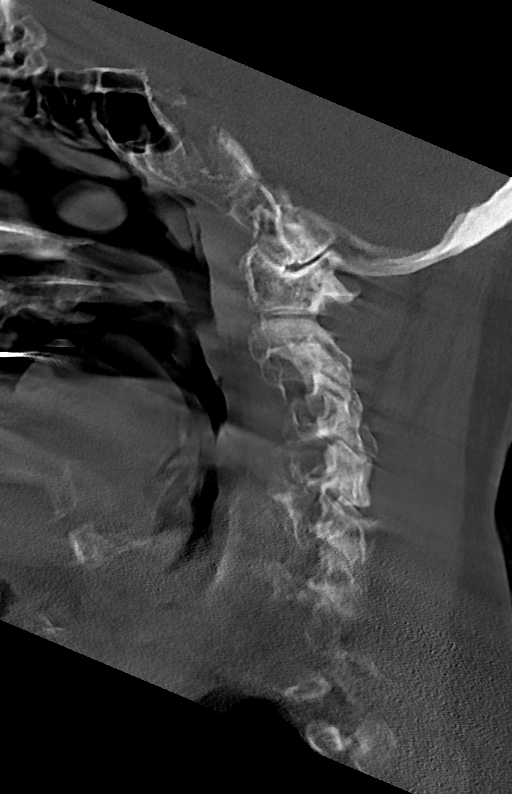
[im 26/61  bone]
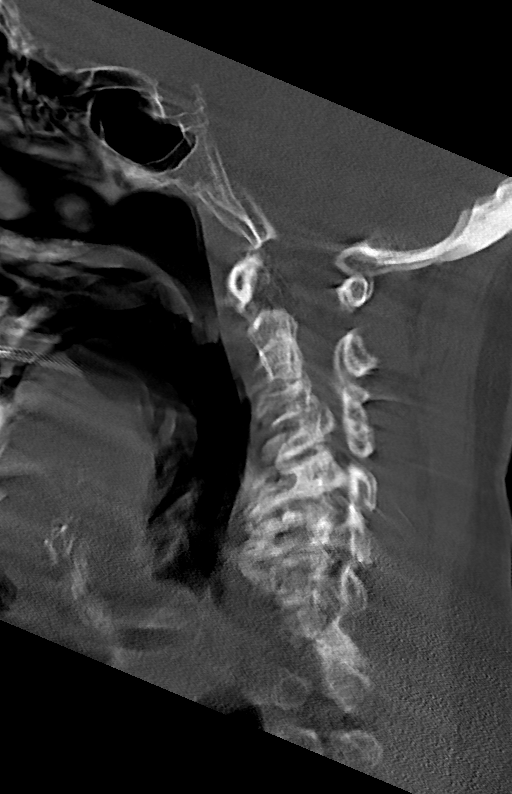
[im 31/61  soft-tissue]
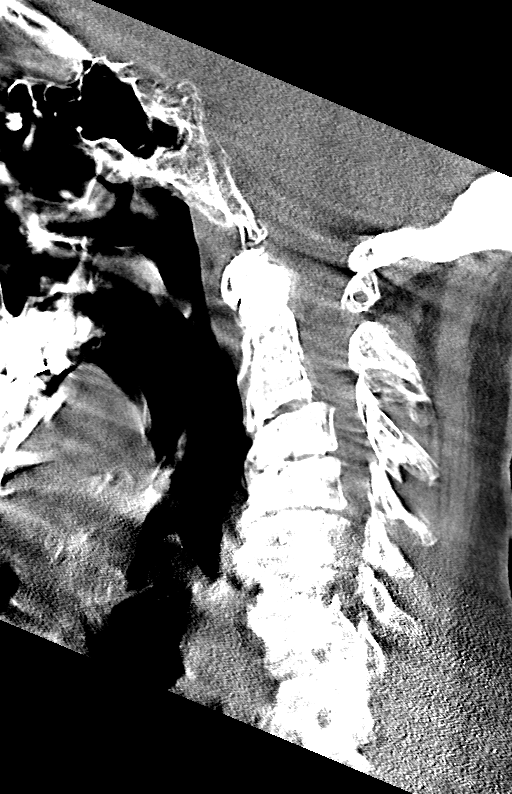
[im 31/61  bone]
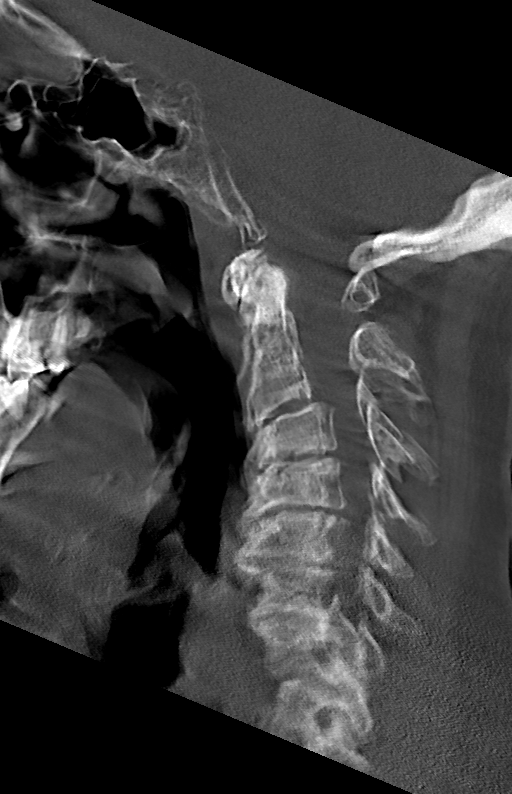
[im 36/61  bone]
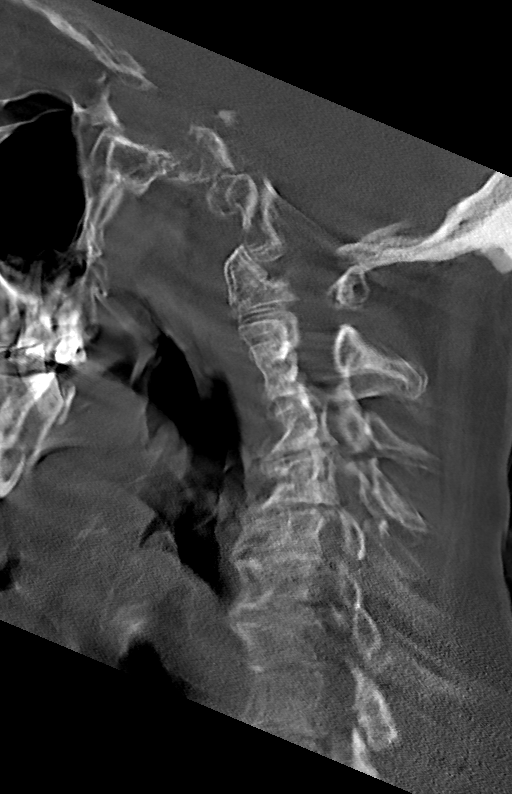
[im 41/61  bone]
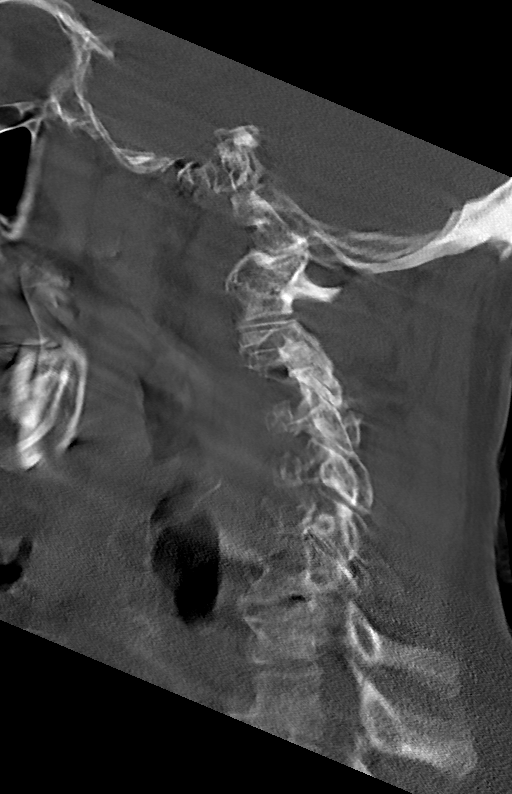

[Series 7: c_spine 2.0 cor bone · coronal · 0.27mm/px · 3 of 61 slices shown]
[im 16/61  bone]
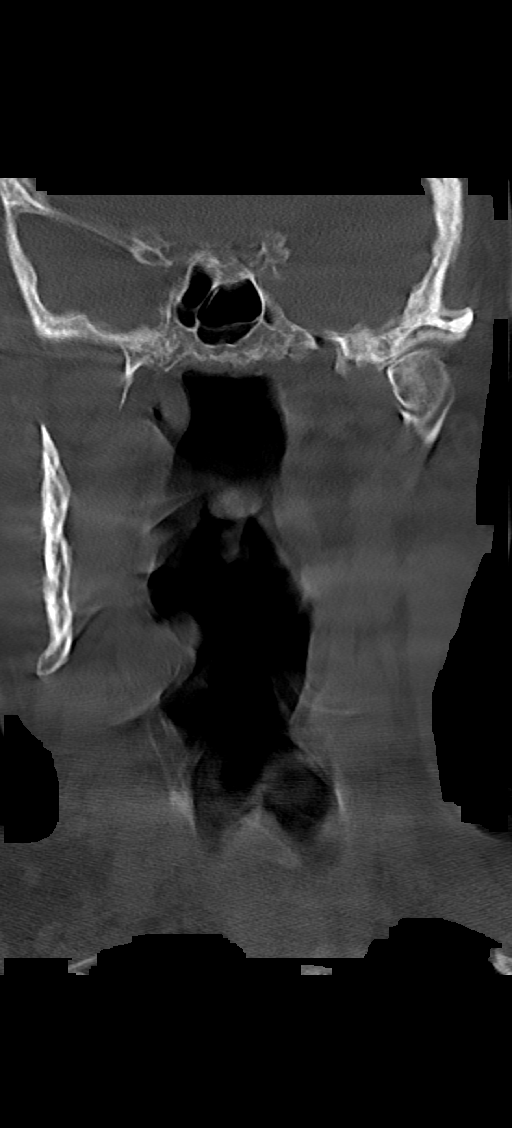
[im 26/61  bone]
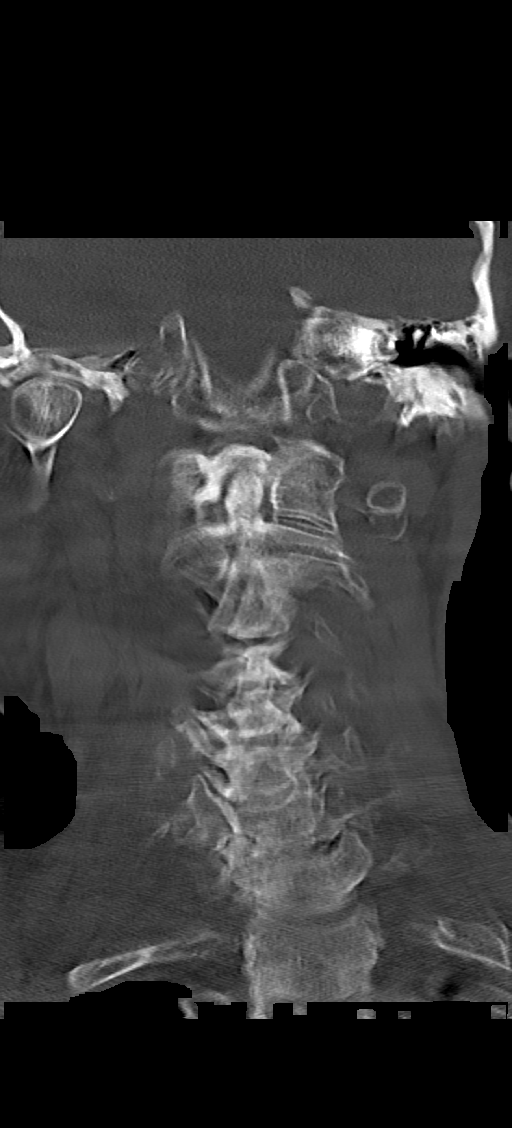
[im 35/61  bone]
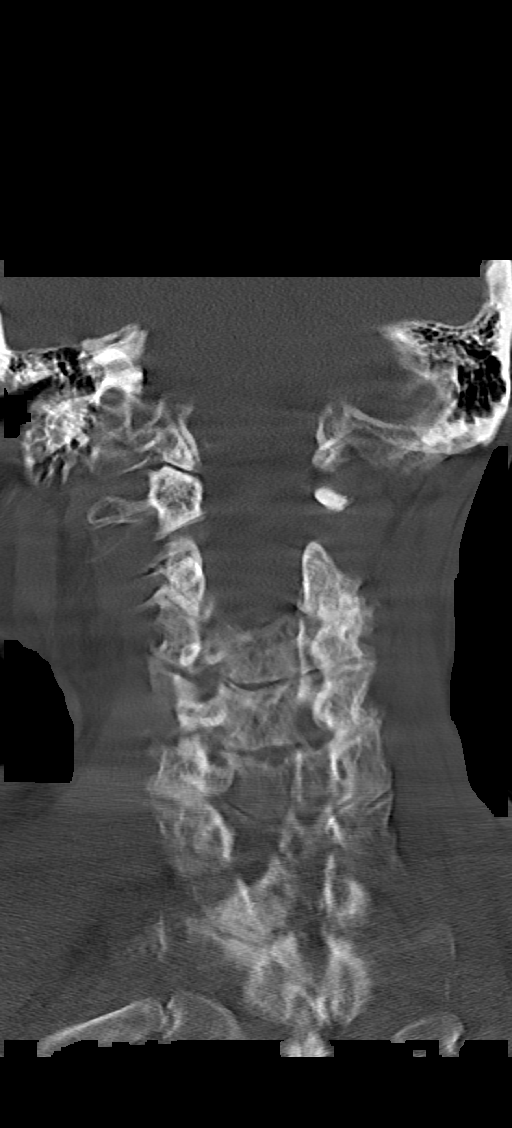

[10 of 33 positions shown; findings below may reference images not displayed]

FINDINGS: CT HEAD FINDINGS

Mild motion artifact.

Brain: No evidence of acute infarction, hemorrhage, hydrocephalus,
extra-axial collection or mass lesion/mass effect. Stable atrophy.

Vascular: Atherosclerotic vascular calcification of the carotid
siphons. No hyperdense vessel.

Skull: Normal. Negative for fracture or focal lesion.

Other: None.

CT MAXILLOFACIAL FINDINGS

Significantly limited by motion artifact.

Osseous: Non-diagnostic exam for facial fracture. Chronic depressed
nasal bone fractures based on head CT.

Orbits: Negative by head CT.

Sinuses: Grossly clear by head CT.

Soft tissues: Negative.

CT CERVICAL SPINE FINDINGS

Significantly limited by motion artifact.

Alignment: Unchanged straightening of the normal cervical lordosis.

Skull base and vertebrae: Non-diagnostic exam for cervical spine
fracture.

Soft tissues and spinal canal: No prevertebral fluid or swelling. No
visible canal hematoma.

Disc levels: Similar moderate to severe disc height loss throughout
the cervical spine.

Upper chest: Negative.

Other: None.
IMPRESSION: 1. No acute intracranial abnormality.
2. Largely non-diagnostic maxillofacial and cervical spine CTs due
to motion artifact. As the patient does not follow commands,
recommend repeat scanning after sedation.

## 2022-02-10 IMAGING — CT CT MAXILLOFACIAL W/O CM
3 series · 15 of 47 positions shown, 18 images · non-contrast
Comparison: CT head and cervical spine dated November 03, 2021. CT
maxillofacial dated November 02, 2021.

CLINICAL DATA: Fall.

EXAM:
CT HEAD WITHOUT CONTRAST
CT MAXILLOFACIAL WITHOUT CONTRAST
CT CERVICAL SPINE WITHOUT CONTRAST
TECHNIQUE: Multidetector CT imaging of the head, cervical spine, and
maxillofacial structures were performed using the standard protocol
without intravenous contrast. Multiplanar CT image reconstructions
of the cervical spine and maxillofacial structures were also
generated.

[Series 3: facialbone 2.0 st · axial · 0.29mm/px · z∈[-218,-74]mm · 9 of 84 slices shown, 12 images]
[im 6/84  brain]
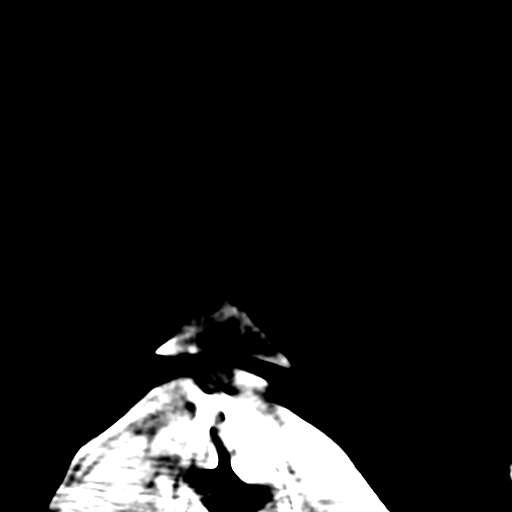
[im 6/84  bone]
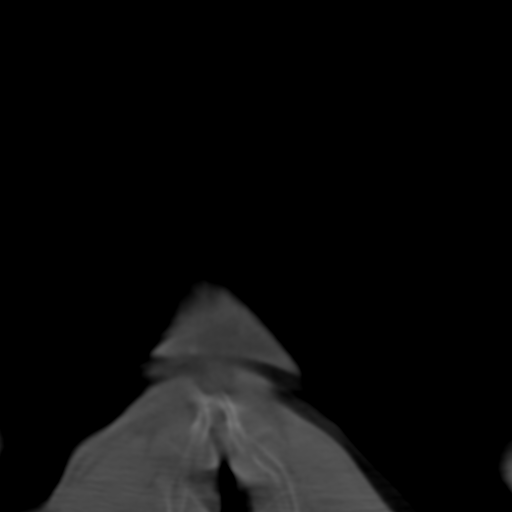
[im 15/84  bone]
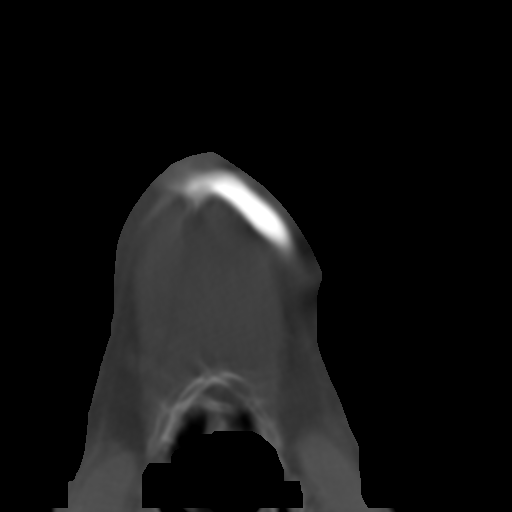
[im 23/84  bone]
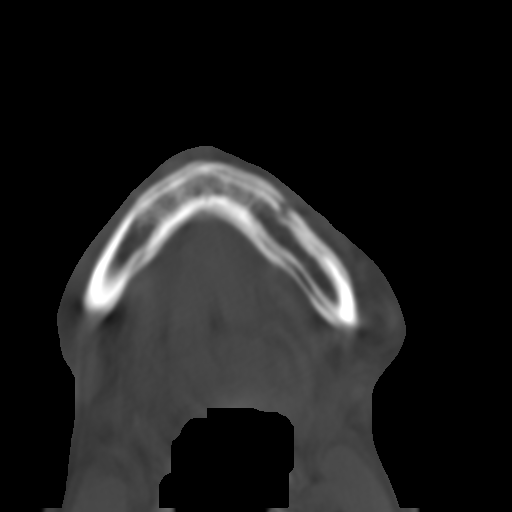
[im 32/84  bone]
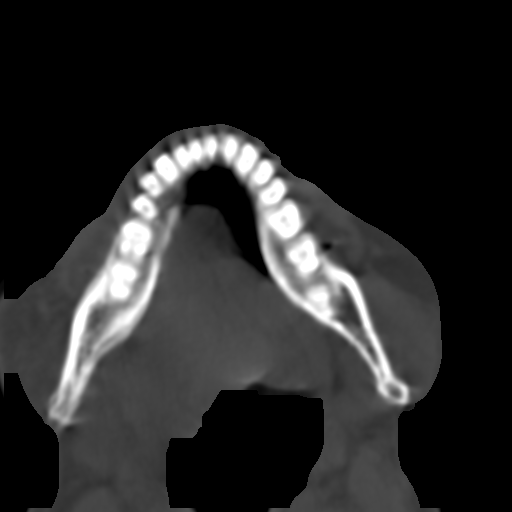
[im 43/84  brain]
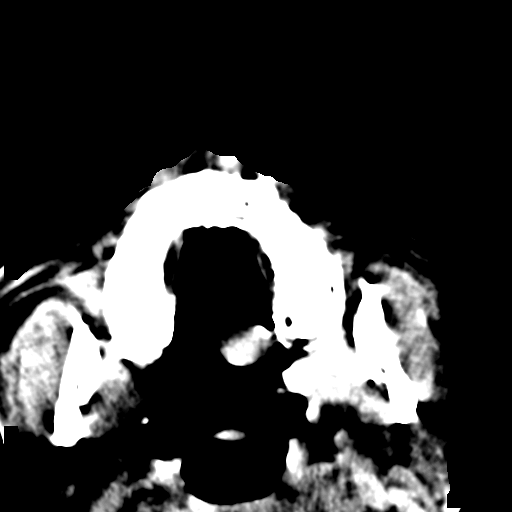
[im 43/84  bone]
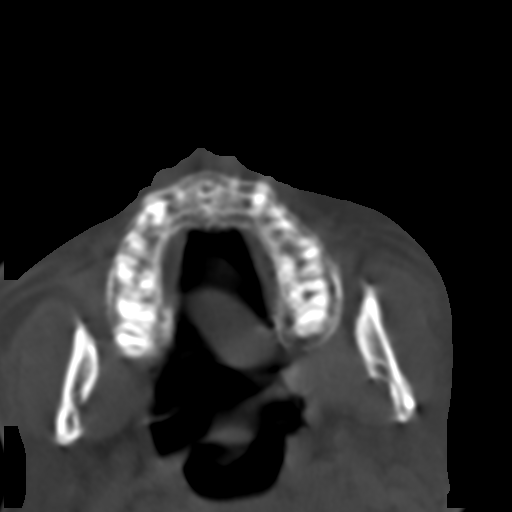
[im 52/84  bone]
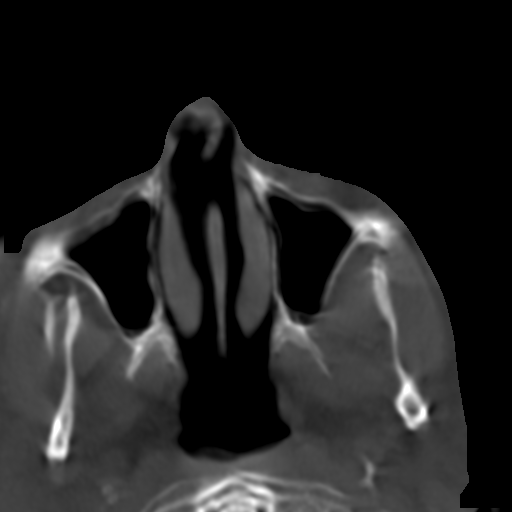
[im 61/84  bone]
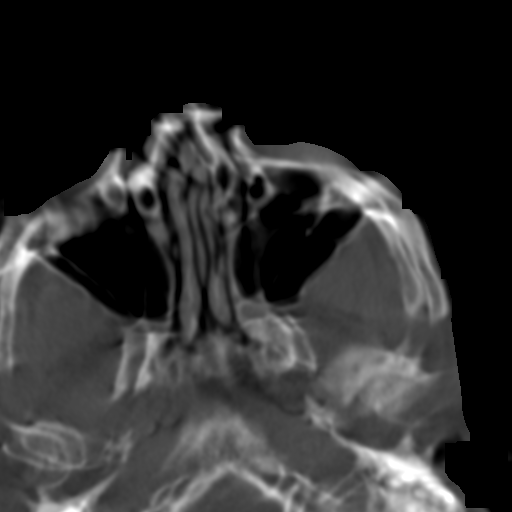
[im 69/84  bone]
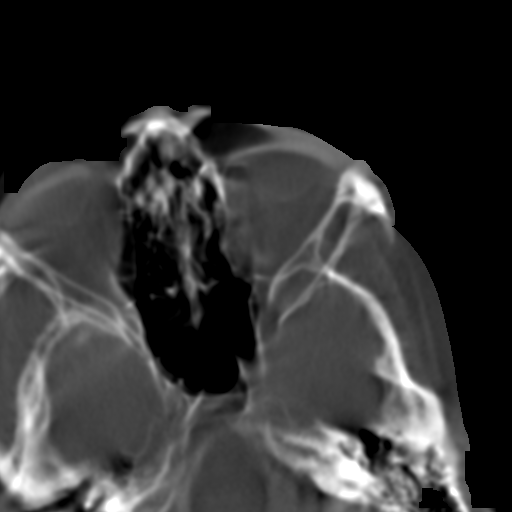
[im 78/84  brain]
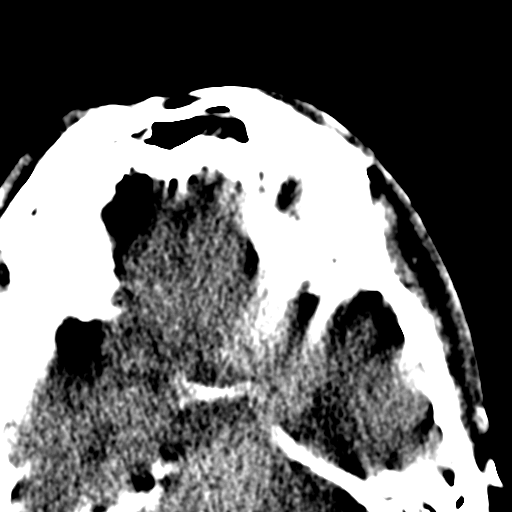
[im 78/84  bone]
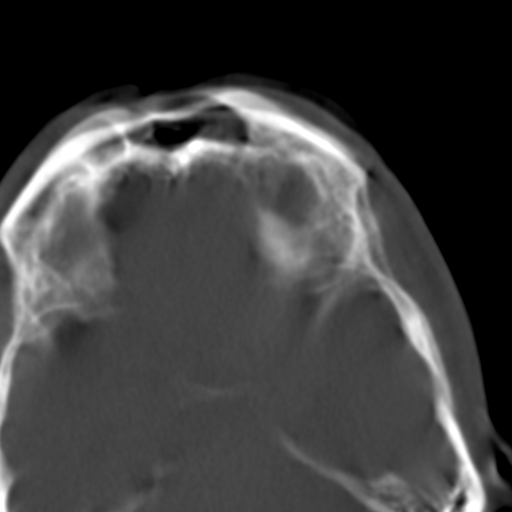

[Series 7: facialbone 2.0 cor st · coronal · 0.37mm/px · 3 of 76 slices shown]
[im 26/76  bone]
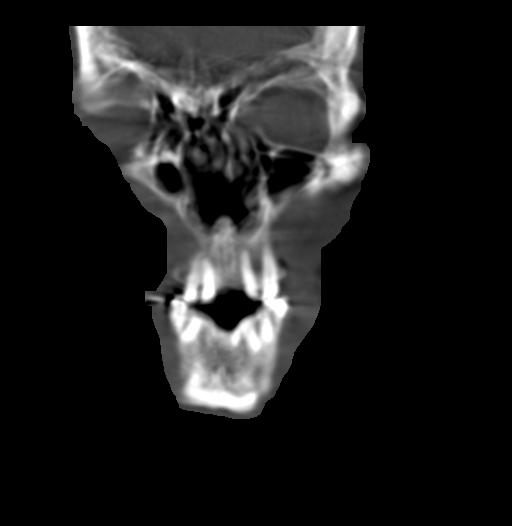
[im 34/76  bone]
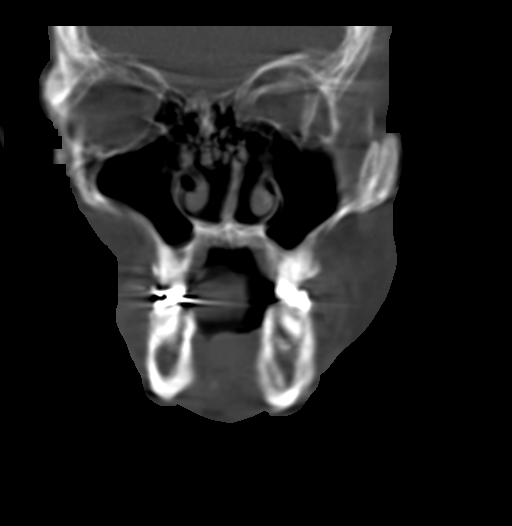
[im 42/76  bone]
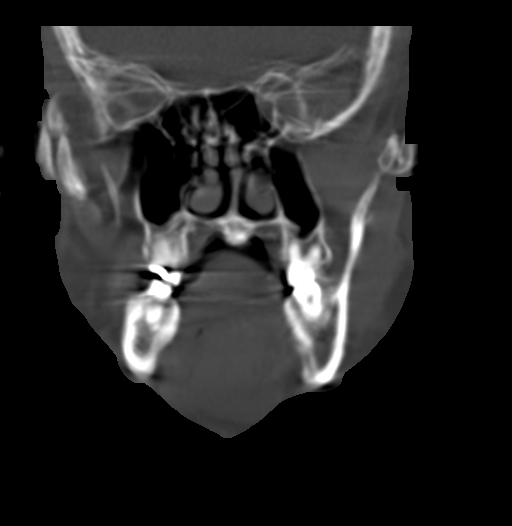

[Series 8: facialbone 2.0 sag st · sagittal · 0.27mm/px · 3 of 86 slices shown]
[im 29/86  bone]
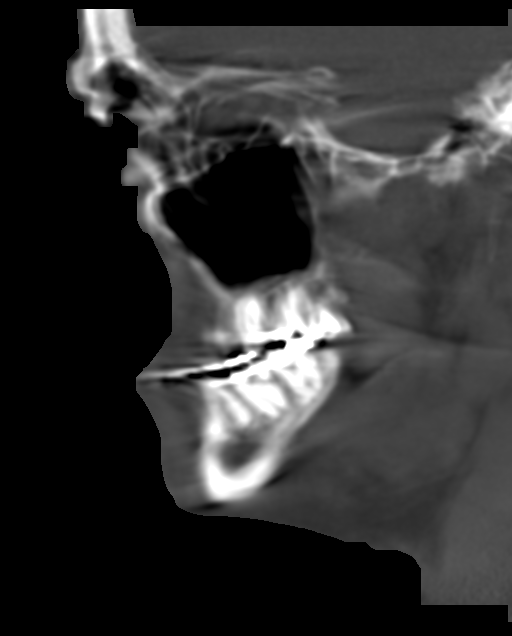
[im 43/86  bone]
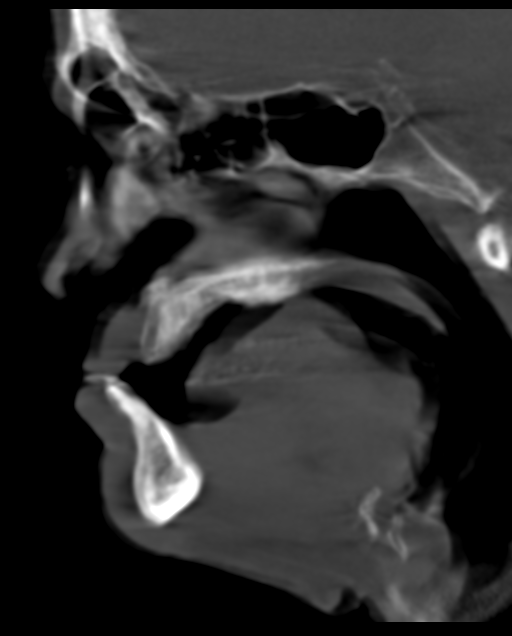
[im 57/86  bone]
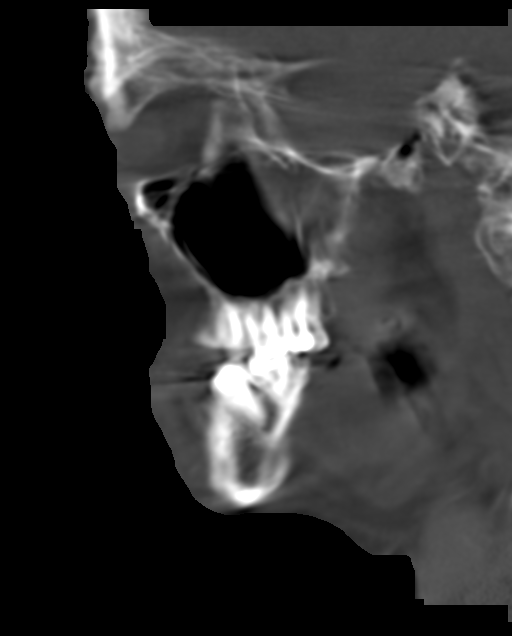

[15 of 47 positions shown; findings below may reference images not displayed]

FINDINGS: CT HEAD FINDINGS

Mild motion artifact.

Brain: No evidence of acute infarction, hemorrhage, hydrocephalus,
extra-axial collection or mass lesion/mass effect. Stable atrophy.

Vascular: Atherosclerotic vascular calcification of the carotid
siphons. No hyperdense vessel.

Skull: Normal. Negative for fracture or focal lesion.

Other: None.

CT MAXILLOFACIAL FINDINGS

Significantly limited by motion artifact.

Osseous: Non-diagnostic exam for facial fracture. Chronic depressed
nasal bone fractures based on head CT.

Orbits: Negative by head CT.

Sinuses: Grossly clear by head CT.

Soft tissues: Negative.

CT CERVICAL SPINE FINDINGS

Significantly limited by motion artifact.

Alignment: Unchanged straightening of the normal cervical lordosis.

Skull base and vertebrae: Non-diagnostic exam for cervical spine
fracture.

Soft tissues and spinal canal: No prevertebral fluid or swelling. No
visible canal hematoma.

Disc levels: Similar moderate to severe disc height loss throughout
the cervical spine.

Upper chest: Negative.

Other: None.
IMPRESSION: 1. No acute intracranial abnormality.
2. Largely non-diagnostic maxillofacial and cervical spine CTs due
to motion artifact. As the patient does not follow commands,
recommend repeat scanning after sedation.

## 2022-02-10 IMAGING — CT CT MAXILLOFACIAL W/O CM
3 of 4 series · 16 of 47 positions shown, 19 images · non-contrast
Comparison: Motion limited exam earlier today. Face CT 11/02/2021
reviewed

CLINICAL DATA: Blunt facial trauma. Repeat exam from earlier today
due to motion.

EXAM:
CT MAXILLOFACIAL WITHOUT CONTRAST
TECHNIQUE: Multidetector CT imaging of the maxillofacial structures was
performed. Multiplanar CT image reconstructions were also generated.
RADIATION DOSE REDUCTION: This exam was performed according to the
departmental dose-optimization program which includes automated
exposure control, adjustment of the mA and/or kV according to
patient size and/or use of iterative reconstruction technique.

[Series 3: facialbone 2.0 st · axial · 0.31mm/px · z∈[-220,-72]mm · 10 of 86 slices shown, 13 images]
[im 6/86  brain]
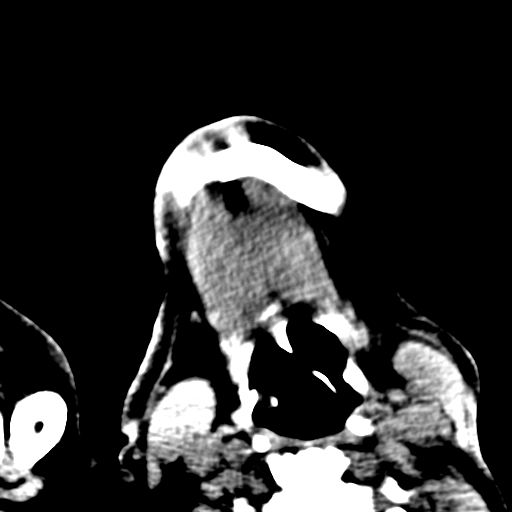
[im 6/86  bone]
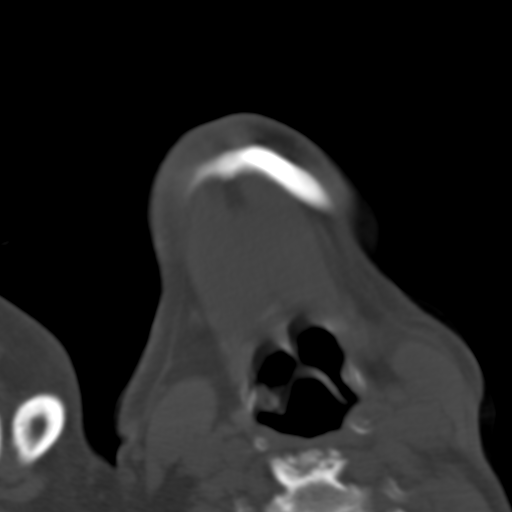
[im 15/86  bone]
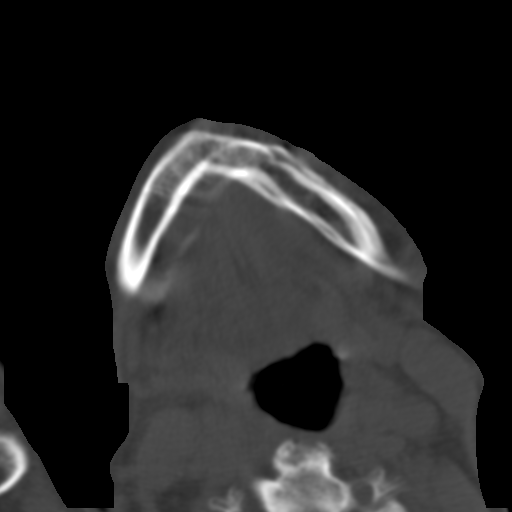
[im 24/86  bone]
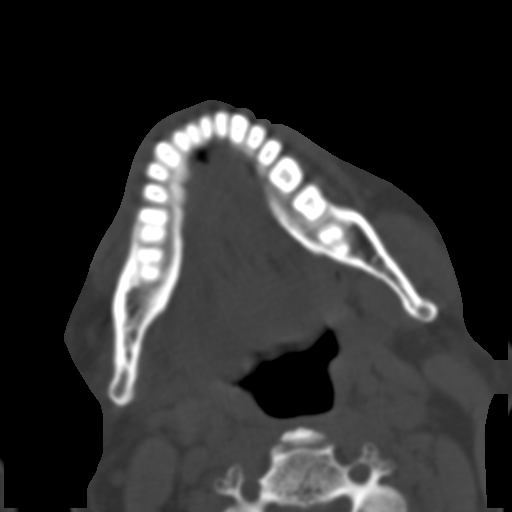
[im 30/86  bone]
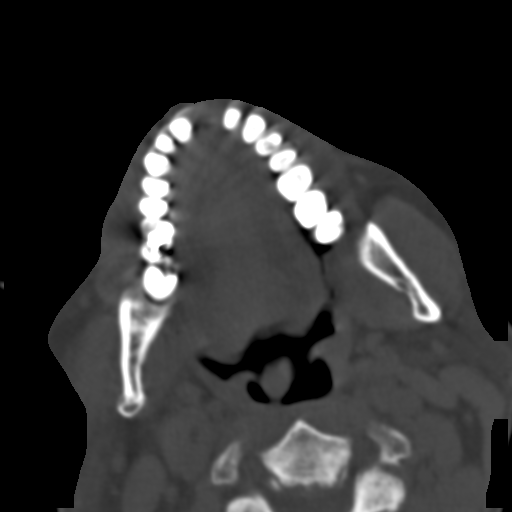
[im 39/86  brain]
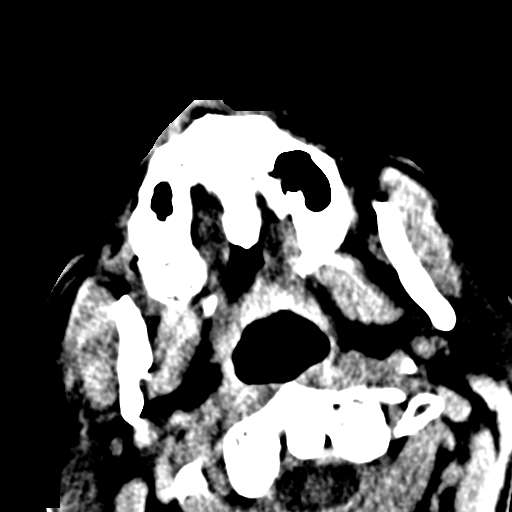
[im 39/86  bone]
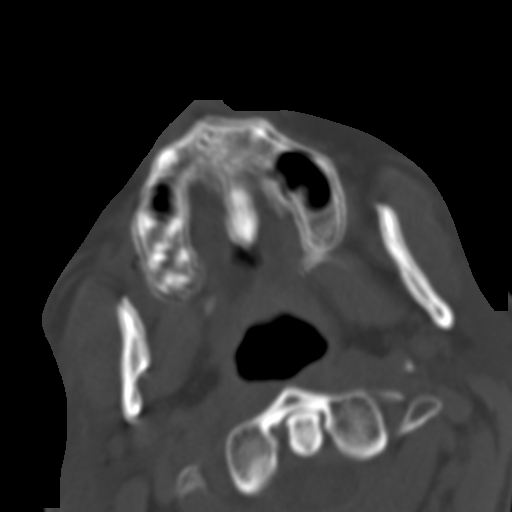
[im 47/86  bone]
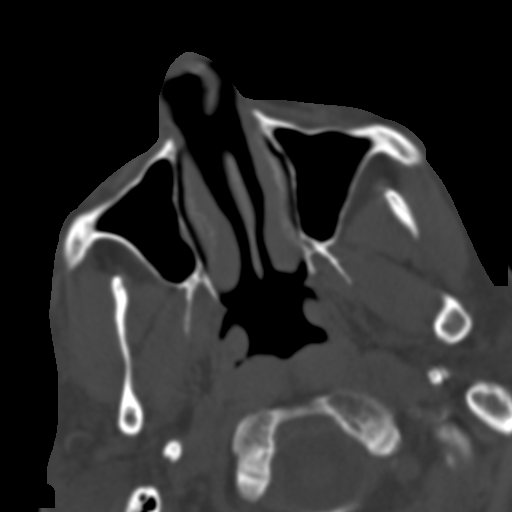
[im 56/86  bone]
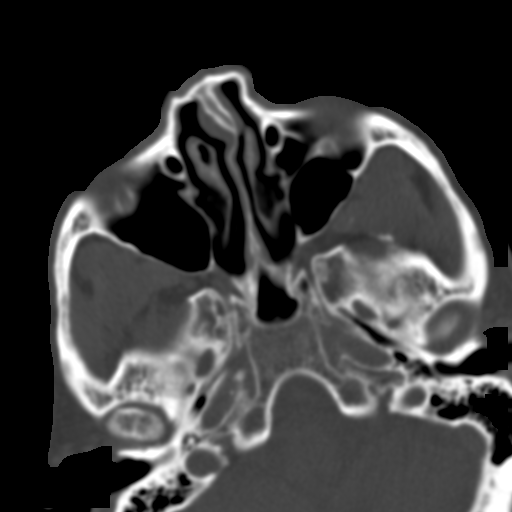
[im 65/86  bone]
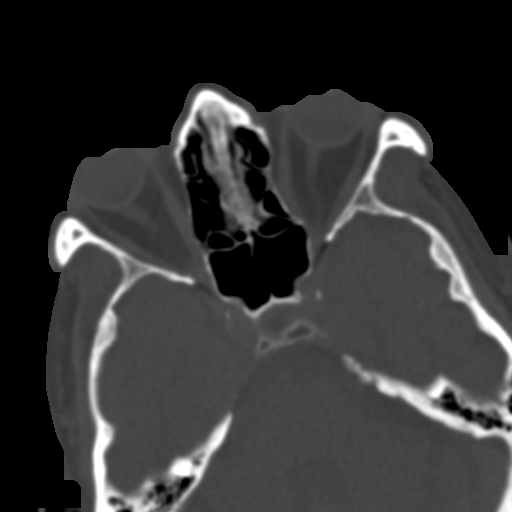
[im 71/86  brain]
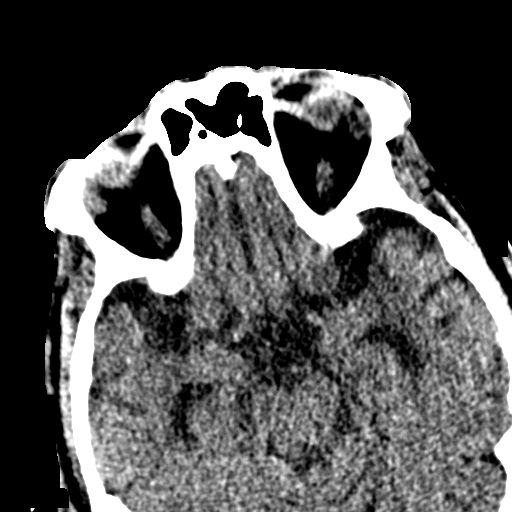
[im 71/86  bone]
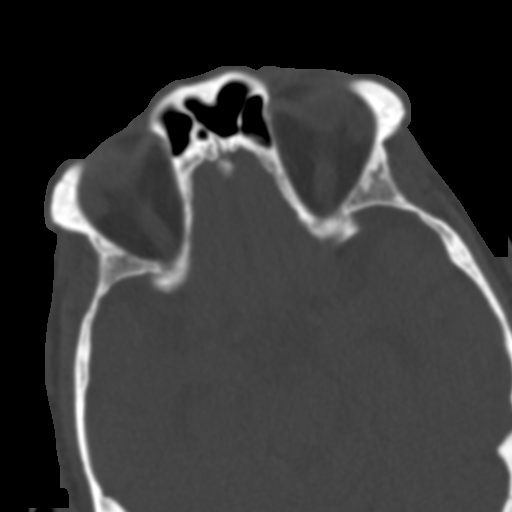
[im 80/86  bone]
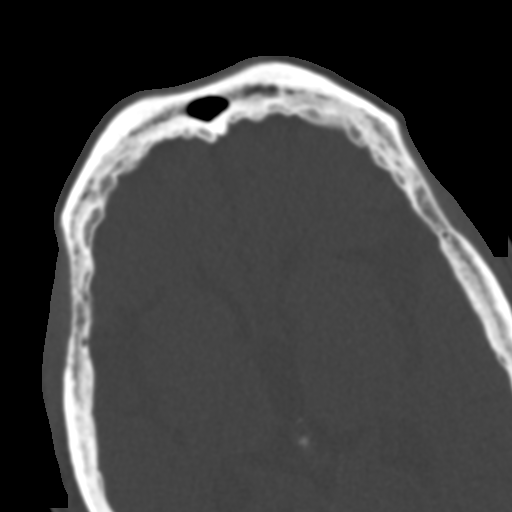

[Series 6: bone 2.0 cor · coronal · 0.38mm/px · 3 of 85 slices shown]
[im 22/85  bone]
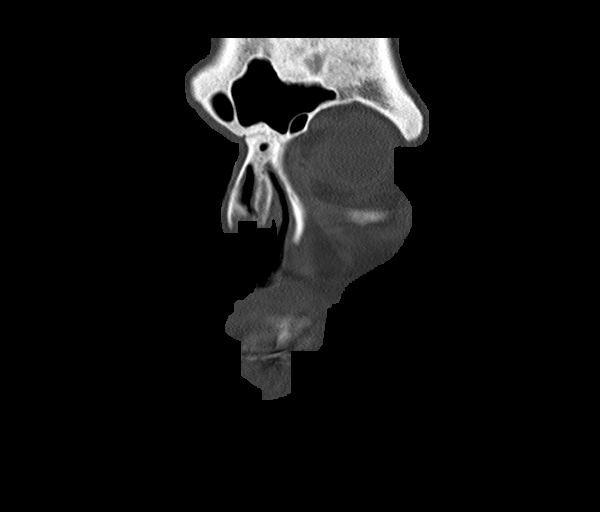
[im 43/85  bone]
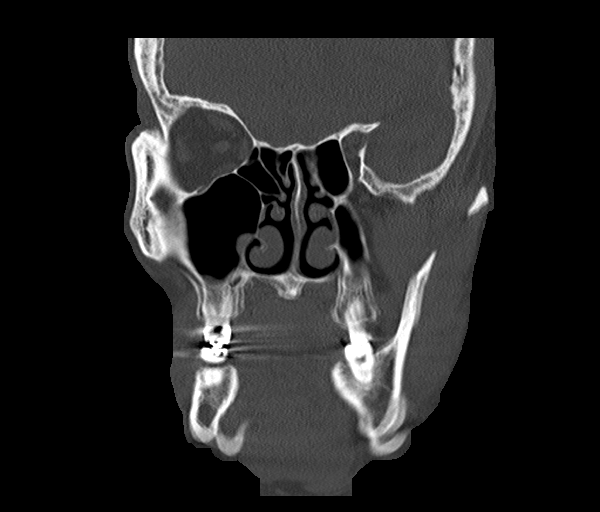
[im 64/85  bone]
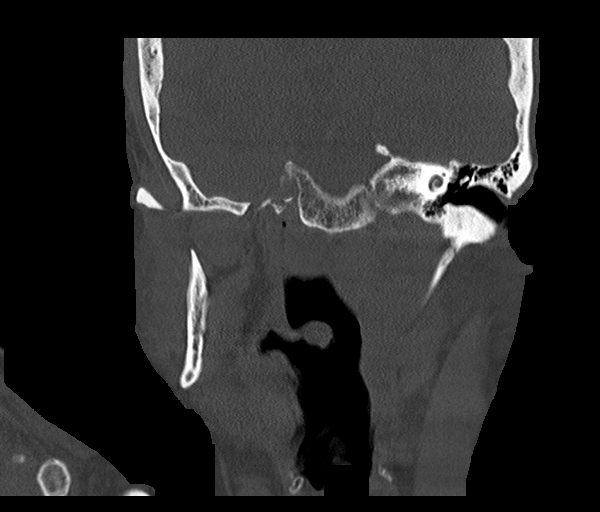

[Series 9: facialbone 2.0 sag st · sagittal · 0.35mm/px · 3 of 87 slices shown]
[im 29/87  bone]
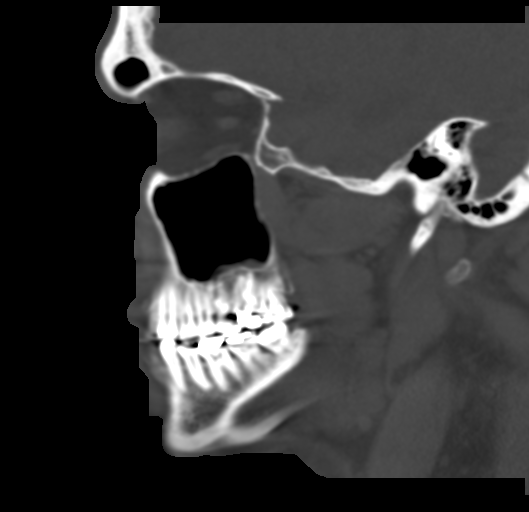
[im 44/87  bone]
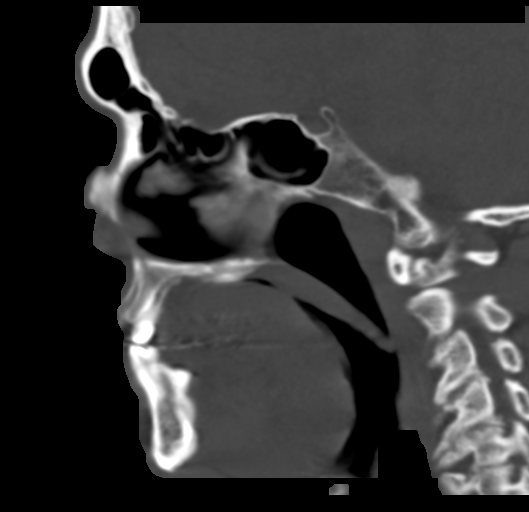
[im 58/87  bone]
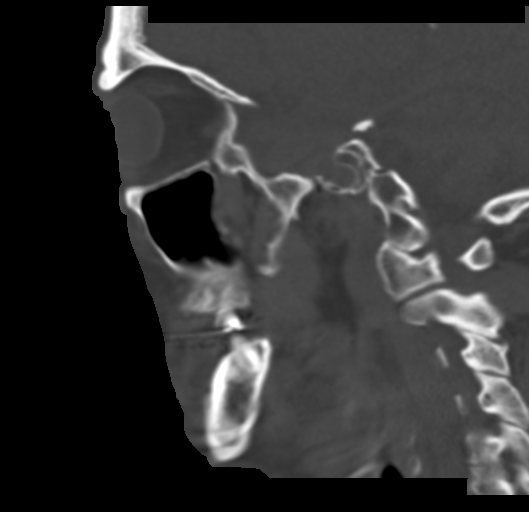

[16 of 47 positions shown; findings below may reference images not displayed]

FINDINGS: Osseous: Chronic nasal bone and septal deformity, unchanged from
prior CT. No acute fracture of zygomatic arches or mandible.
Temporomandibular joints are congruent. Absent right frontal
incisor, chronic.

Orbits: No acute orbital fracture. Chronic defect of the left lamina
papyracea. No globe injury.

Sinuses: No sinus fracture or fluid level. Small mucous retention
cysts in the maxillary sinuses.

Soft tissues: No acute findings.

Limited intracranial: Assessed on head CT earlier today.
IMPRESSION: 1. No acute facial bone fracture.
2. Chronic nasal bone deformity, unchanged from prior CT.

## 2022-02-13 IMAGING — CT CT CERVICAL SPINE W/O CM
3 of 4 series · 11 of 35 positions shown, 13 images · non-contrast
Comparison: CT scan head, maxillofacial, cervical spine without
contrast 01/08/2022

CLINICAL DATA: Facial and head trauma.  Fell.  Neck pain.



[Series 9: sag bone · sagittal · 0.44mm/px · 5 of 248 slices shown, 6 images]
[im 83/248  bone]
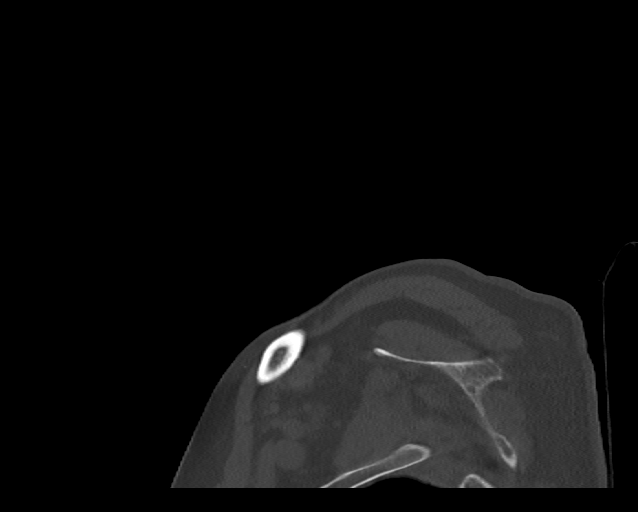
[im 103/248  bone]
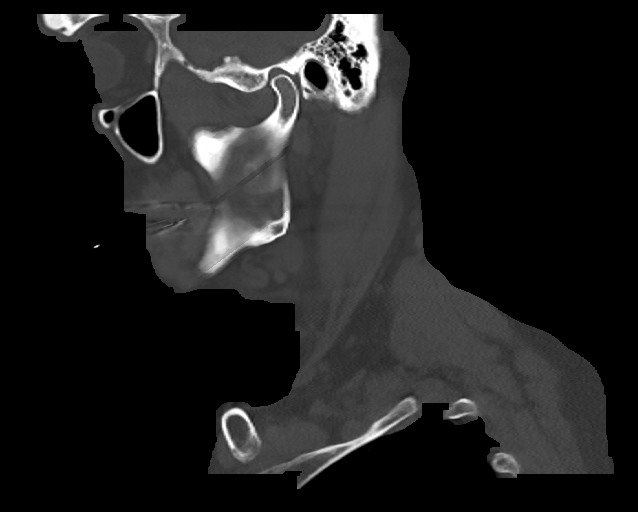
[im 124/248  soft-tissue]
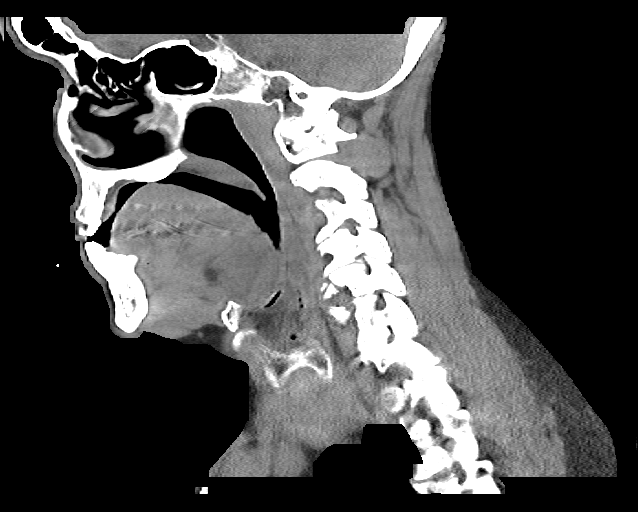
[im 124/248  bone]
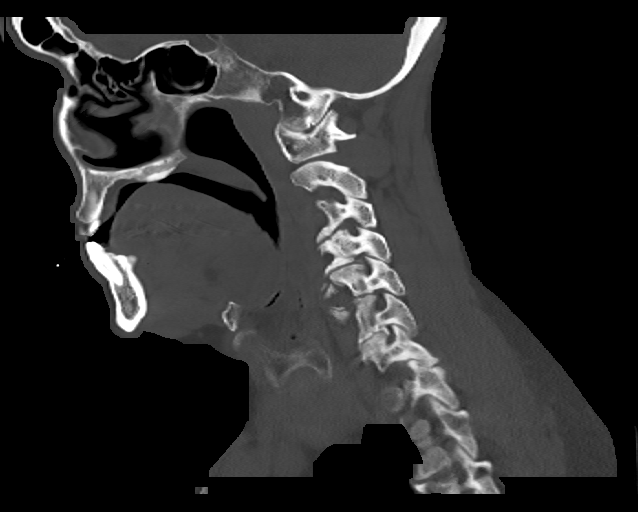
[im 145/248  bone]
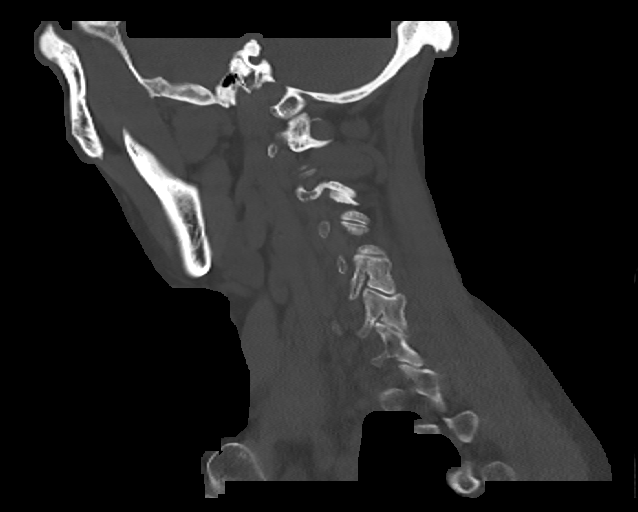
[im 165/248  bone]
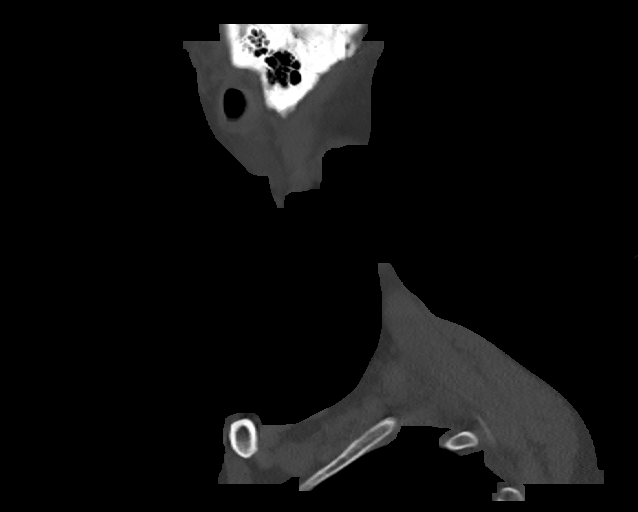

[Series 10: cor bone · coronal · 0.44mm/px · 3 of 140 slices shown]
[im 28/140  bone]
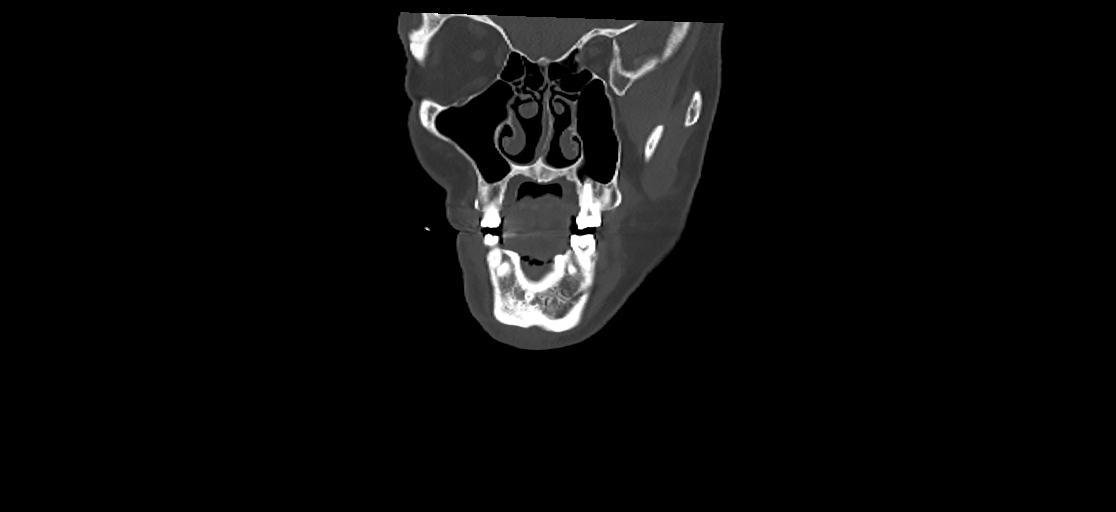
[im 56/140  bone]
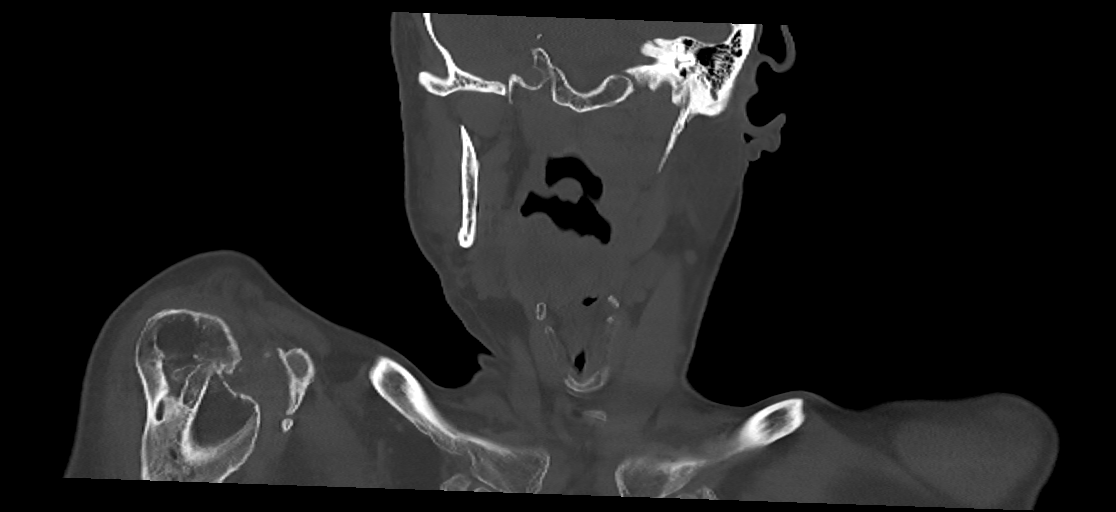
[im 84/140  bone]
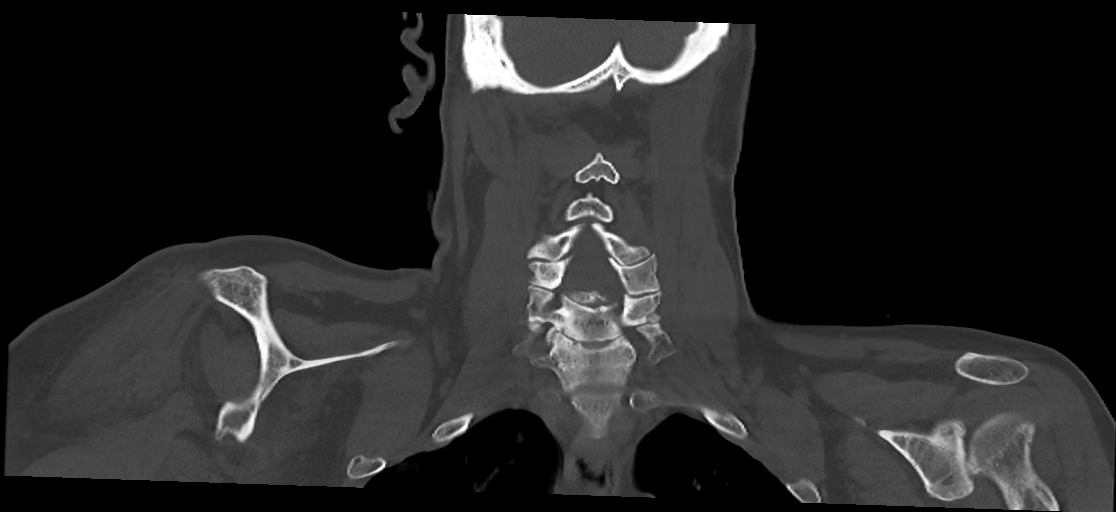

[Series 11: orthogonal axials · axial · 0.21mm/px · z∈[-150,-45]mm · 3 of 98 slices shown, 4 images]
[im 17/98  soft-tissue]
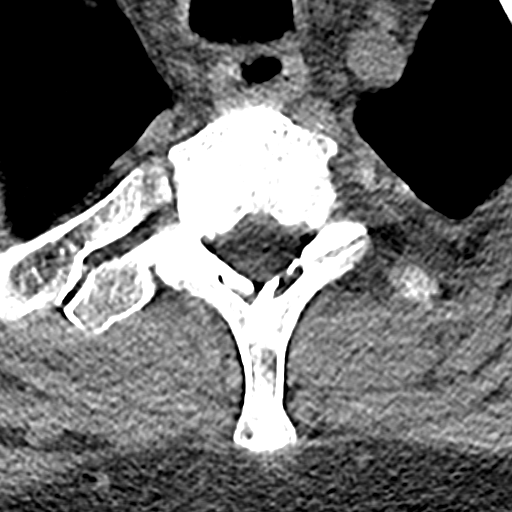
[im 17/98  bone]
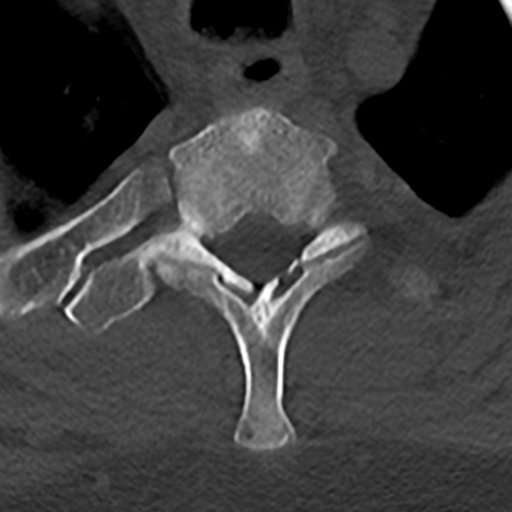
[im 49/98  bone]
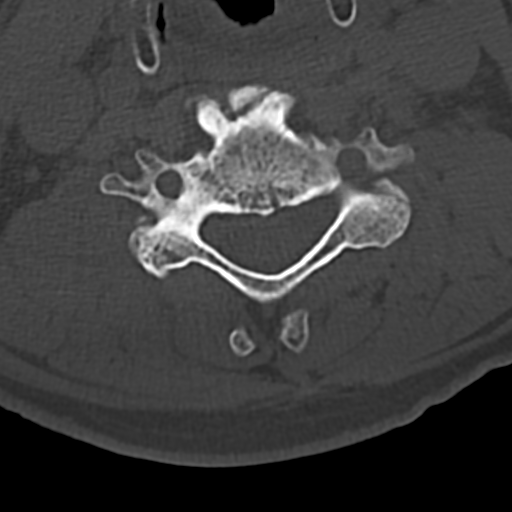
[im 81/98  bone]
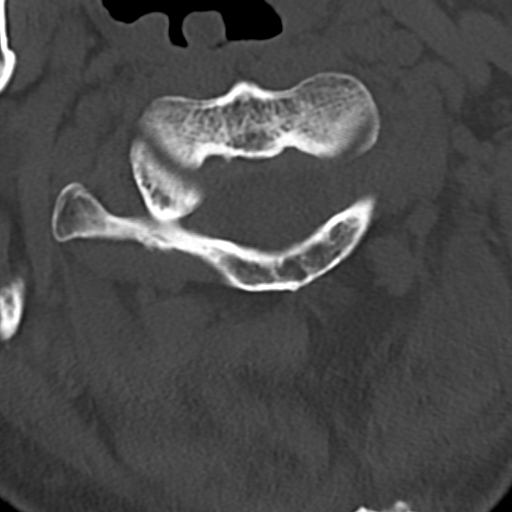

[11 of 35 positions shown; findings below may reference images not displayed]

FINDINGS: CT HEAD FINDINGS

Brain: Stable mild global atrophy including centrally. No asymmetry
is seen concerning for acute infarct, hemorrhage or mass.

Vascular: No hyperdense vessel or unexpected calcification. There
calcifications in the distal vertebral arteries and both siphons.

Skull: There is a new scalp hematoma in the left forehead region,
small amount. The calvarium, skull base and orbits are intact.

Other: None.

CT MAXILLOFACIAL FINDINGS

Osseous: Chronic depressed nasal bone fractures are present. Reverse
S-shaped nasal septum with cartilaginous septal defect again shown.
No acute facial fracture, mandibular fracture, or mandibular
dislocation is seen.

Orbits: Chronic mildly depressed fracture of the posterior left
lamina papyracea. No acute traumatic or inflammatory finding.

Sinuses: There is slight membrane thickening in the paranasal
sinuses without fluid level. The ostiomeatal complexes are clear
with intact nasal turbinates and mild membrane thickening in the
nasal passages. There is trace fluid in the left mastoid tip. The
right mastoids are clear.

Soft tissues: Negative.

CT CERVICAL SPINE FINDINGS

Alignment: Straightened and otherwise unremarkable.

Skull base and vertebrae: No appreciable fracture or focal bone
lesion.

Soft tissues and spinal canal: No prevertebral fluid or swelling. No
visible canal hematoma.

Disc levels: The cervical discs are diffusely degenerated. There are
prominent anterior osteophytes. There are congenitally short
pedicles reducing the effective AP diameter of the thecal sac to
about 7.5 mm at most levels.

In addition to prominent anterior osteophytes, posterior disc
osteophyte complexes are again noted causing moderate spinal canal
stenosis with borderline to mild cord compression at C3-4, C4-5,
C5-6 and C6-7.

As before, uncinate joint and facet arthrosis is associated with
multilevel severe foraminal stenosis, for the most part greater on
the right except at C3-4 where it is greater on the left, at all
levels is exacerbated by short pedicles in this patient.

Upper chest: Negative.

Other: None.
IMPRESSION: 1. No acute intracranial CT findings or depressed skull fractures.
Left forehead small scalp hematoma.
2. No evidence of orbital or facial fractures or hematoma with
chronic depressed fractures of the nasal bone and the posteromedial
left orbital wall, and sinonasal disease described above.
3. Cervical spine degenerative changes without evidence of
fractures. Straightened lordosis.
4. Detailed findings discussed above.

## 2022-02-13 IMAGING — CR DG FINGER LITTLE 2+V*L*
4 series · 4 of 4 positions shown · non-contrast
Comparison: None.

CLINICAL DATA: Status post fall.

EXAM:
LEFT LITTLE FINGER 2+V

[finger ap]
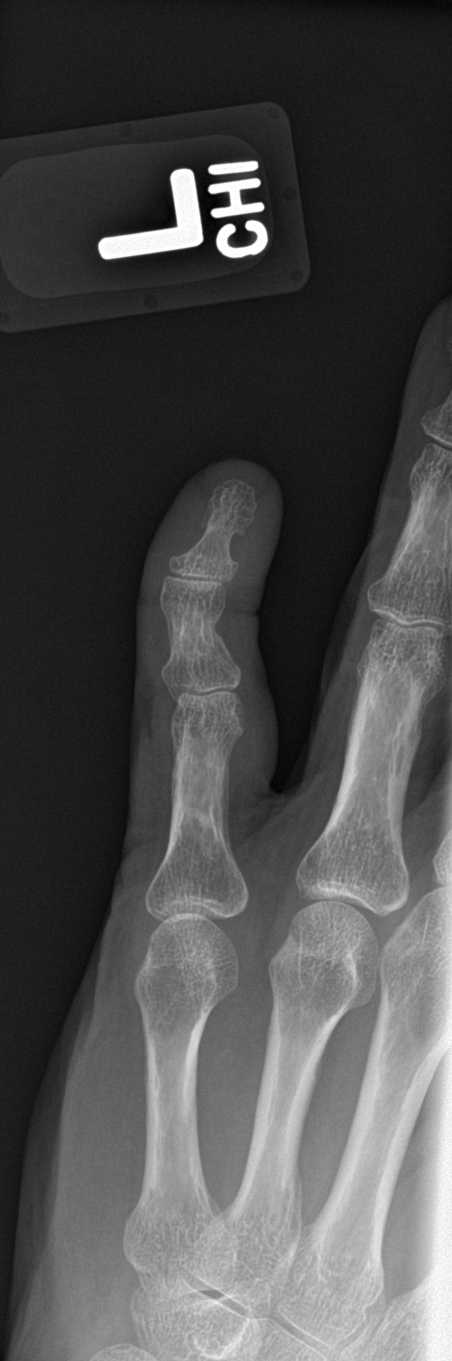

[finger obl]
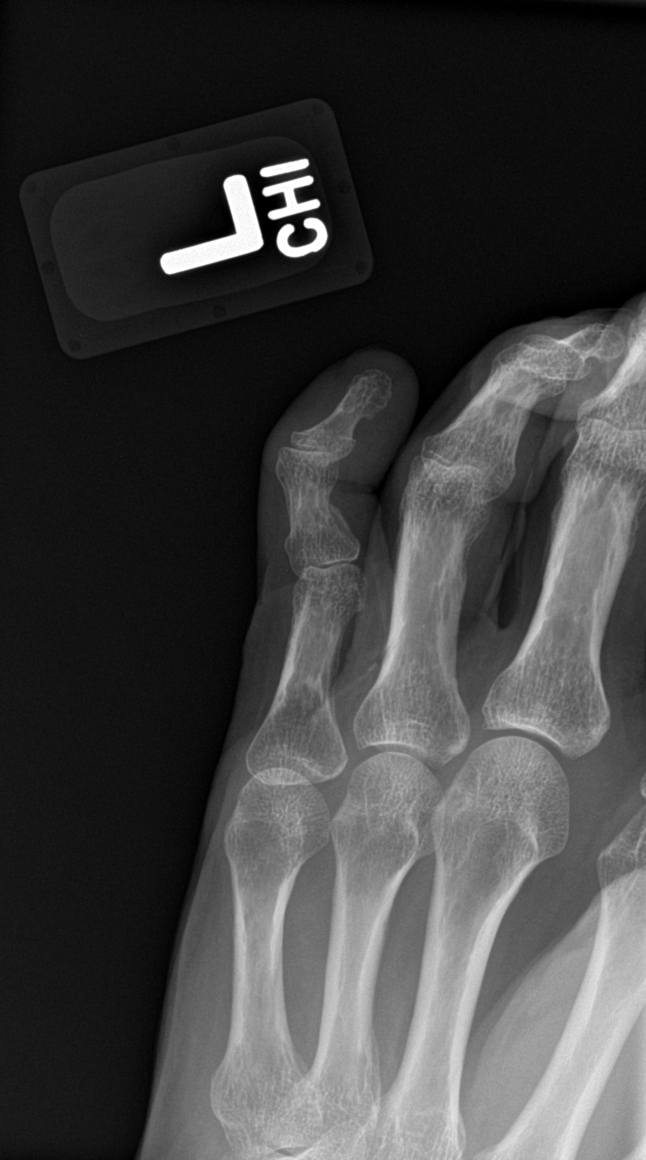

[finger lat (1 of 2)]
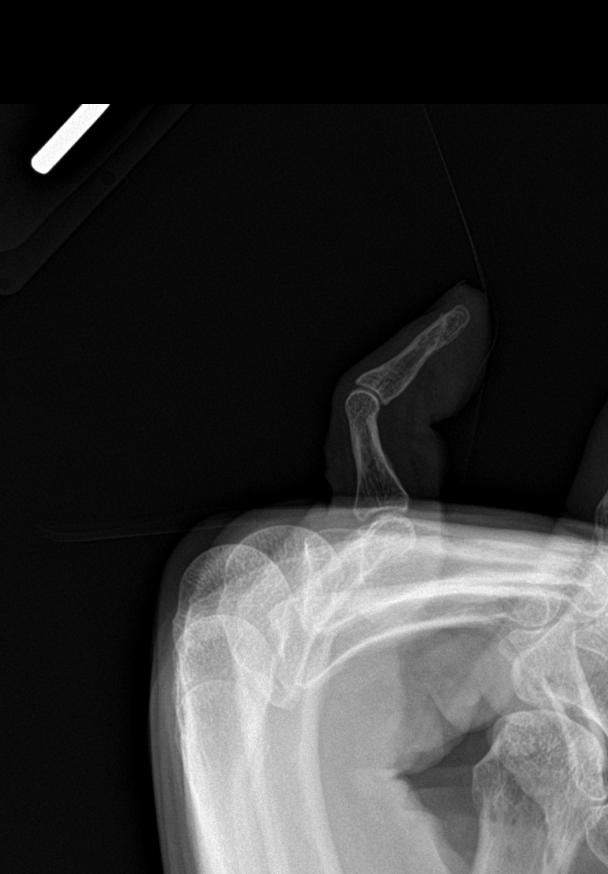

[finger lat (2 of 2)]
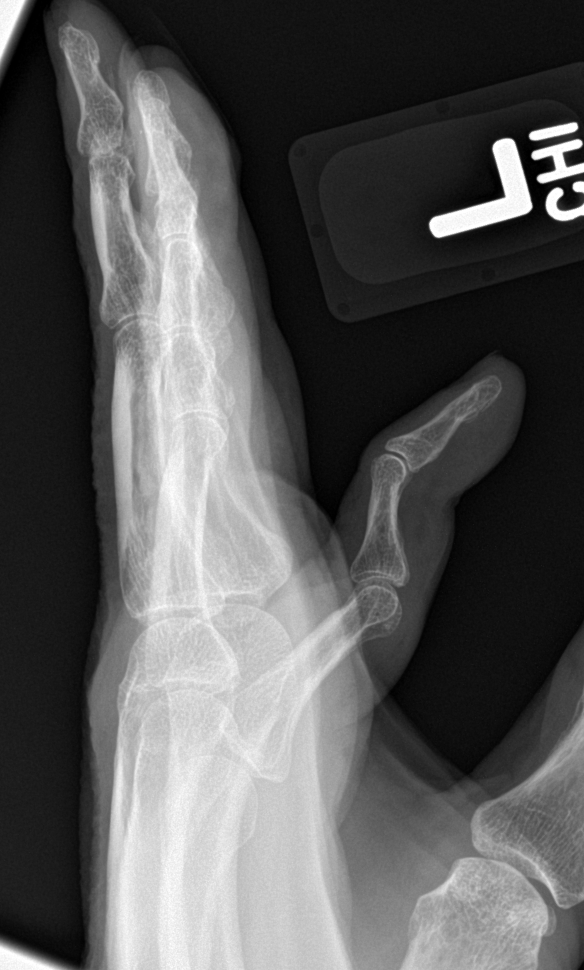

[4 of 4 positions shown; findings below may reference images not displayed]

FINDINGS: There is no evidence of an acute fracture or dislocation. Mild,
chronic versus congenital changes are seen involving the middle
phalanx of the fifth left finger. Mild diffuse soft tissue swelling
is present with a small superficial soft tissue defect seen adjacent
to the PIP joint.
IMPRESSION: 1. No acute fracture or dislocation.
2. Mild chronic versus congenital changes involving the middle
phalanx of the fifth left finger.

## 2022-02-13 IMAGING — CT CT HEAD W/O CM
4 series · 15 of 47 positions shown, 17 images · non-contrast
Comparison: CT scan head, maxillofacial, cervical spine without
contrast 01/08/2022

CLINICAL DATA: Facial and head trauma.  Fell.  Neck pain.



[Series 4: head wo · axial · 0.49mm/px · z∈[+1,+136]mm · 7 of 37 slices shown, 9 images]
[im 5/37  brain]
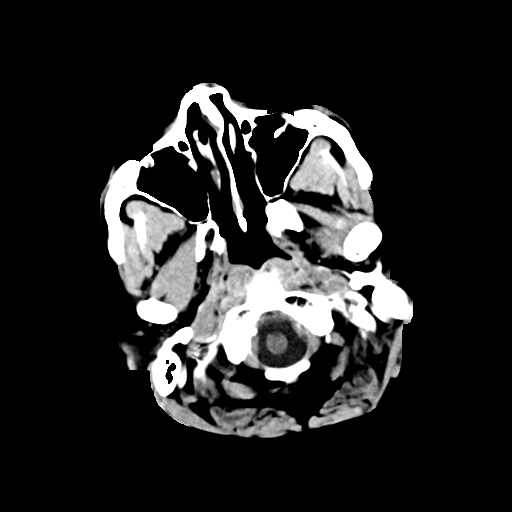
[im 5/37  bone]
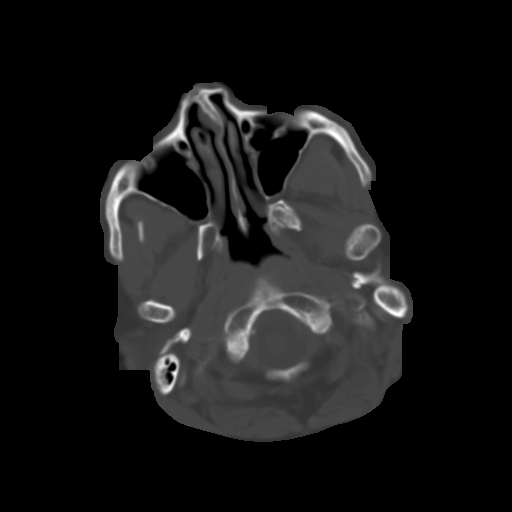
[im 10/37  brain]
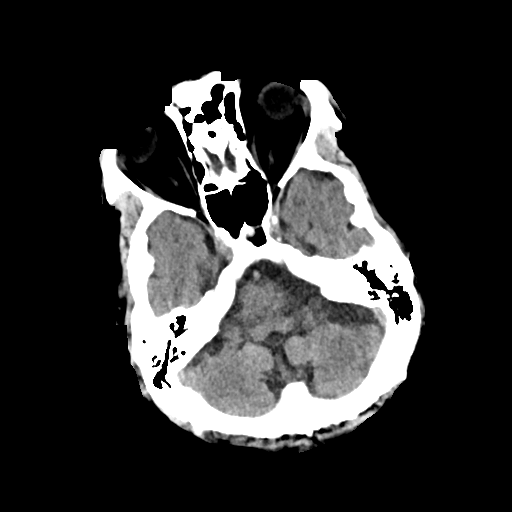
[im 14/37  brain]
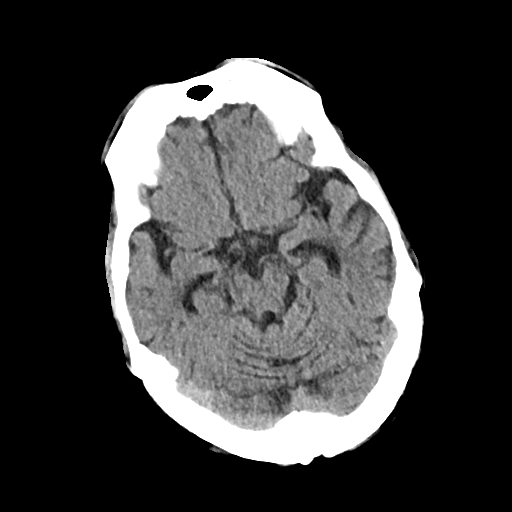
[im 19/37  brain]
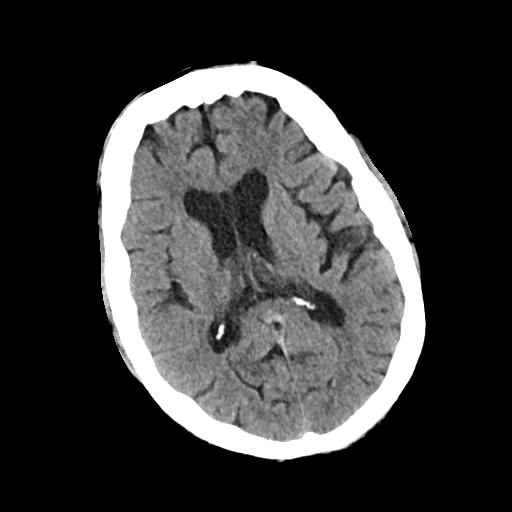
[im 23/37  brain]
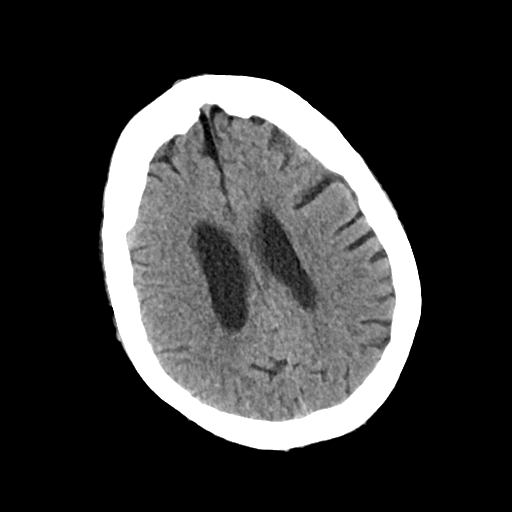
[im 23/37  bone]
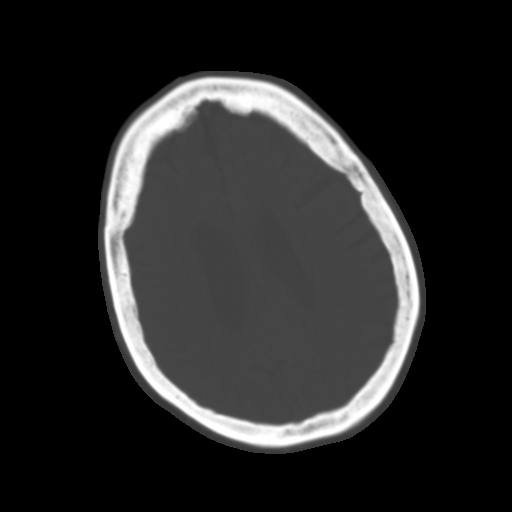
[im 28/37  brain]
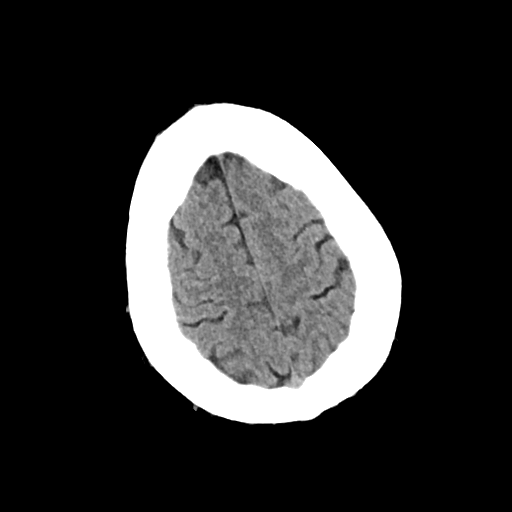
[im 32/37  brain]
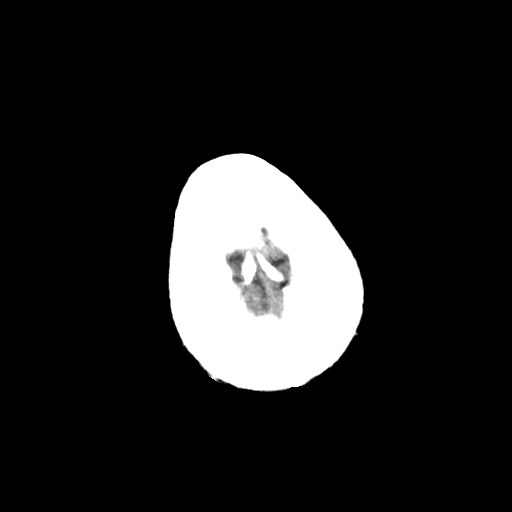

[Series 5: head bone · axial · 0.49mm/px · z∈[-1,+17]mm · 2 of 91 slices shown]
[im 10/91  bone]
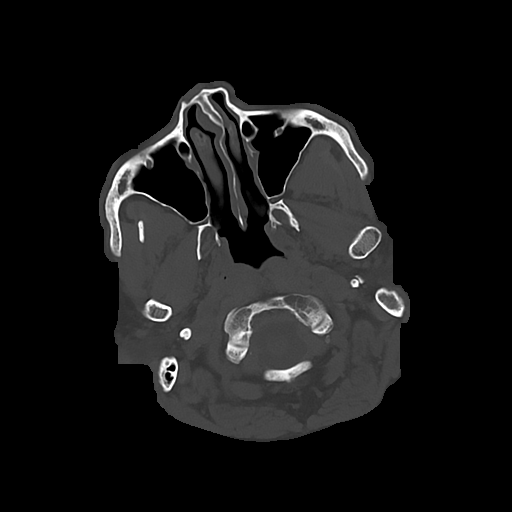
[im 19/91  bone]
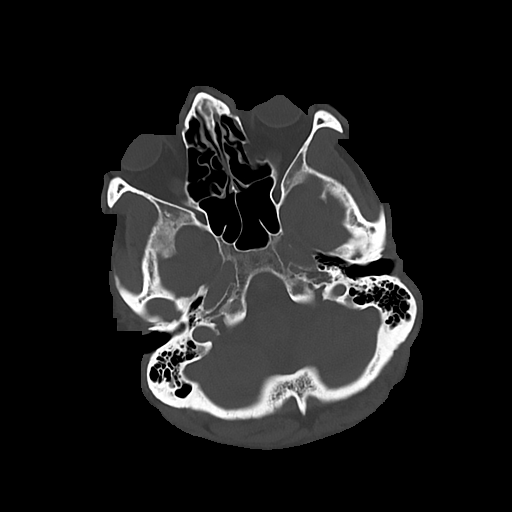

[Series 6: cor soft · coronal · 0.37mm/px · 3 of 73 slices shown]
[im 25/73  brain]
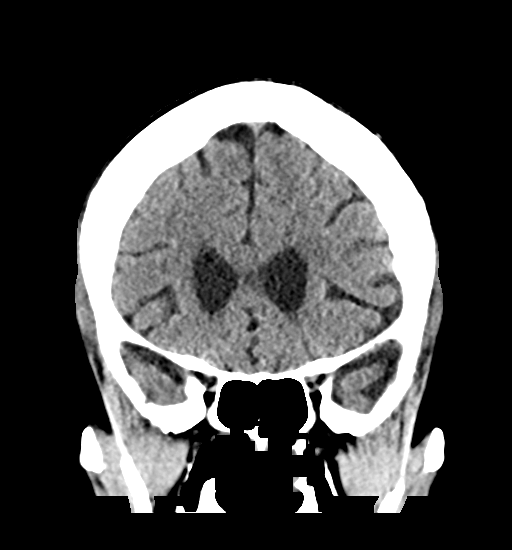
[im 33/73  brain]
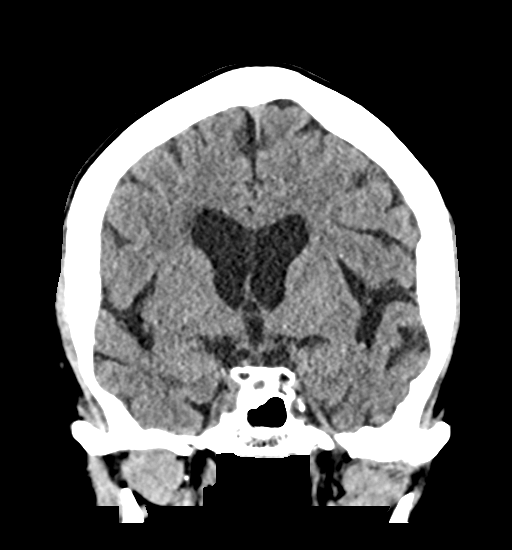
[im 41/73  brain]
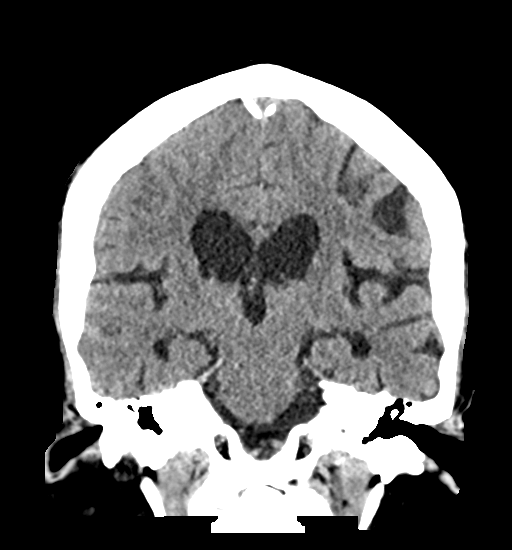

[Series 7: sag soft · sagittal · 0.45mm/px · 3 of 63 slices shown]
[im 21/63  brain]
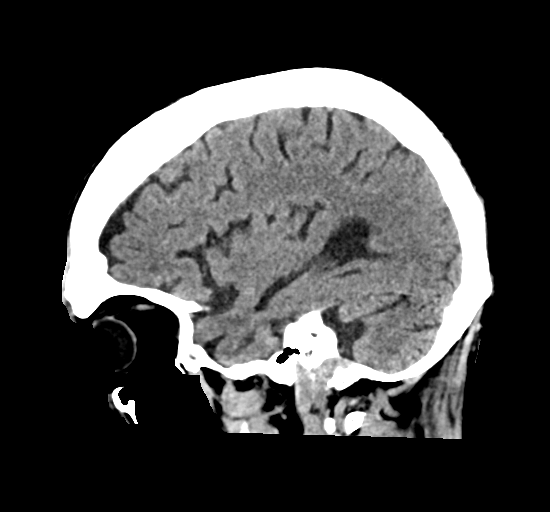
[im 32/63  brain]
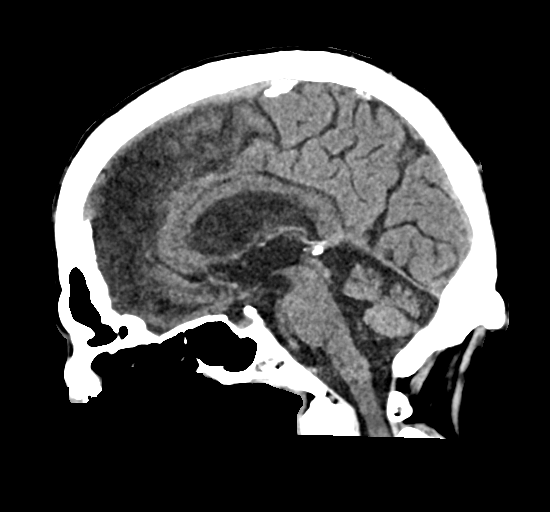
[im 42/63  brain]
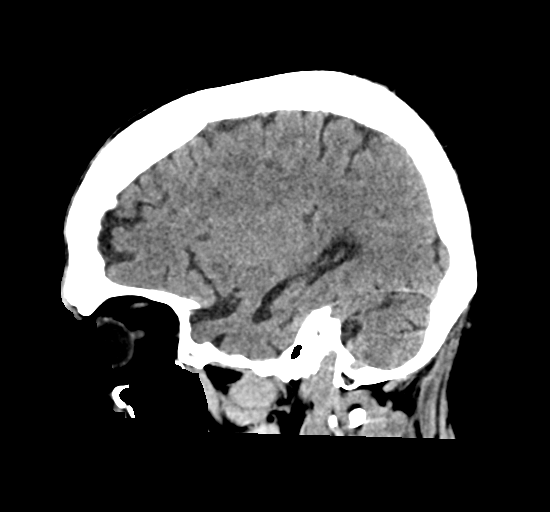

[15 of 47 positions shown; findings below may reference images not displayed]

FINDINGS: CT HEAD FINDINGS

Brain: Stable mild global atrophy including centrally. No asymmetry
is seen concerning for acute infarct, hemorrhage or mass.

Vascular: No hyperdense vessel or unexpected calcification. There
calcifications in the distal vertebral arteries and both siphons.

Skull: There is a new scalp hematoma in the left forehead region,
small amount. The calvarium, skull base and orbits are intact.

Other: None.

CT MAXILLOFACIAL FINDINGS

Osseous: Chronic depressed nasal bone fractures are present. Reverse
S-shaped nasal septum with cartilaginous septal defect again shown.
No acute facial fracture, mandibular fracture, or mandibular
dislocation is seen.

Orbits: Chronic mildly depressed fracture of the posterior left
lamina papyracea. No acute traumatic or inflammatory finding.

Sinuses: There is slight membrane thickening in the paranasal
sinuses without fluid level. The ostiomeatal complexes are clear
with intact nasal turbinates and mild membrane thickening in the
nasal passages. There is trace fluid in the left mastoid tip. The
right mastoids are clear.

Soft tissues: Negative.

CT CERVICAL SPINE FINDINGS

Alignment: Straightened and otherwise unremarkable.

Skull base and vertebrae: No appreciable fracture or focal bone
lesion.

Soft tissues and spinal canal: No prevertebral fluid or swelling. No
visible canal hematoma.

Disc levels: The cervical discs are diffusely degenerated. There are
prominent anterior osteophytes. There are congenitally short
pedicles reducing the effective AP diameter of the thecal sac to
about 7.5 mm at most levels.

In addition to prominent anterior osteophytes, posterior disc
osteophyte complexes are again noted causing moderate spinal canal
stenosis with borderline to mild cord compression at C3-4, C4-5,
C5-6 and C6-7.

As before, uncinate joint and facet arthrosis is associated with
multilevel severe foraminal stenosis, for the most part greater on
the right except at C3-4 where it is greater on the left, at all
levels is exacerbated by short pedicles in this patient.

Upper chest: Negative.

Other: None.
IMPRESSION: 1. No acute intracranial CT findings or depressed skull fractures.
Left forehead small scalp hematoma.
2. No evidence of orbital or facial fractures or hematoma with
chronic depressed fractures of the nasal bone and the posteromedial
left orbital wall, and sinonasal disease described above.
3. Cervical spine degenerative changes without evidence of
fractures. Straightened lordosis.
4. Detailed findings discussed above.

## 2022-02-15 ENCOUNTER — Emergency Department (HOSPITAL_COMMUNITY): Payer: Medicare Other

## 2022-02-15 ENCOUNTER — Encounter (HOSPITAL_COMMUNITY): Payer: Self-pay

## 2022-02-15 ENCOUNTER — Emergency Department (HOSPITAL_COMMUNITY)
Admission: EM | Admit: 2022-02-15 | Discharge: 2022-02-15 | Disposition: A | Discharge status: Home / Self Care | Payer: Medicare Other | Attending: Emergency Medicine | Admitting: Emergency Medicine

## 2022-02-15 DIAGNOSIS — S0990XA Unspecified injury of head, initial encounter: Secondary | ICD-10-CM | POA: Insufficient documentation

## 2022-02-15 DIAGNOSIS — M549 Dorsalgia, unspecified: Secondary | ICD-10-CM | POA: Insufficient documentation

## 2022-02-15 DIAGNOSIS — W06XXXA Fall from bed, initial encounter: Secondary | ICD-10-CM | POA: Insufficient documentation

## 2022-02-15 DIAGNOSIS — W19XXXA Unspecified fall, initial encounter: Secondary | ICD-10-CM

## 2022-02-15 DIAGNOSIS — F0390 Unspecified dementia without behavioral disturbance: Secondary | ICD-10-CM | POA: Diagnosis not present

## 2022-02-15 DIAGNOSIS — N3000 Acute cystitis without hematuria: Secondary | ICD-10-CM | POA: Diagnosis not present

## 2022-02-15 DIAGNOSIS — Z79899 Other long term (current) drug therapy: Secondary | ICD-10-CM | POA: Diagnosis not present

## 2022-02-15 DIAGNOSIS — E039 Hypothyroidism, unspecified: Secondary | ICD-10-CM | POA: Insufficient documentation

## 2022-02-15 DIAGNOSIS — Z91040 Latex allergy status: Secondary | ICD-10-CM | POA: Diagnosis not present

## 2022-02-15 DIAGNOSIS — Z7982 Long term (current) use of aspirin: Secondary | ICD-10-CM | POA: Insufficient documentation

## 2022-02-15 LAB — COMPREHENSIVE METABOLIC PANEL
ALT: 9 U/L (ref 0–44)
AST: 12 U/L — ABNORMAL LOW (ref 15–41)
Albumin: 3.4 g/dL — ABNORMAL LOW (ref 3.5–5.0)
Alkaline Phosphatase: 55 U/L (ref 38–126)
Anion gap: 6 (ref 5–15)
BUN: 18 mg/dL (ref 6–20)
CO2: 25 mmol/L (ref 22–32)
Calcium: 8.6 mg/dL — ABNORMAL LOW (ref 8.9–10.3)
Chloride: 107 mmol/L (ref 98–111)
Creatinine, Ser: 0.77 mg/dL (ref 0.61–1.24)
GFR, Estimated: >60 mL/min (ref >60)
Glucose, Bld: 73 mg/dL (ref 70–99)
Potassium: 3.8 mmol/L (ref 3.5–5.1)
Sodium: 138 mmol/L (ref 135–145)
Total Bilirubin: 0.5 mg/dL (ref 0.3–1.2)
Total Protein: 7.1 g/dL (ref 6.5–8.1)

## 2022-02-15 LAB — URINALYSIS, ROUTINE W REFLEX MICROSCOPIC
Bacteria, UA: NONE SEEN
Bilirubin Urine: NEGATIVE
Glucose, UA: NEGATIVE mg/dL
Hgb urine dipstick: NEGATIVE
Ketones, ur: 5 mg/dL — AB
Nitrite: POSITIVE — AB
Protein, ur: NEGATIVE mg/dL
Specific Gravity, Urine: 1.021 (ref 1.005–1.030)
WBC, UA: >50 WBC/hpf — ABNORMAL HIGH (ref 0–5)
pH: 7 (ref 5.0–8.0)

## 2022-02-15 LAB — CBC WITH DIFFERENTIAL/PLATELET
Abs Immature Granulocytes: 0.02 10*3/uL (ref 0.00–0.07)
Basophils Absolute: 0 10*3/uL (ref 0.0–0.1)
Basophils Relative: 1 %
Eosinophils Absolute: 0.1 10*3/uL (ref 0.0–0.5)
Eosinophils Relative: 2 %
HCT: 40.4 % (ref 39.0–52.0)
Hemoglobin: 13 g/dL (ref 13.0–17.0)
Immature Granulocytes: 0 %
Lymphocytes Relative: 32 %
Lymphs Abs: 2.1 10*3/uL (ref 0.7–4.0)
MCH: 29.1 pg (ref 26.0–34.0)
MCHC: 32.2 g/dL (ref 30.0–36.0)
MCV: 90.6 fL (ref 80.0–100.0)
Monocytes Absolute: 0.6 10*3/uL (ref 0.1–1.0)
Monocytes Relative: 10 %
Neutro Abs: 3.7 10*3/uL (ref 1.7–7.7)
Neutrophils Relative %: 55 %
Platelets: 144 10*3/uL — ABNORMAL LOW (ref 150–400)
RBC: 4.46 MIL/uL (ref 4.22–5.81)
RDW: 13.4 % (ref 11.5–15.5)
WBC: 6.5 10*3/uL (ref 4.0–10.5)
nRBC: 0 % (ref 0.0–0.2)

## 2022-02-15 MED ORDER — CIPROFLOXACIN HCL 500 MG PO TABS: 500.0000 mg | ORAL_TABLET | Freq: Two times a day (BID) | ORAL | 0 refills | Status: AC

## 2022-02-15 MED ORDER — ACETAMINOPHEN 325 MG PO TABS
650.0000 mg | ORAL_TABLET | Freq: Once | ORAL | Status: AC
  Administered 2022-02-15: 650 mg via ORAL
  Filled 2022-02-15: qty 2

## 2022-02-15 NOTE — ED Notes (Signed)
RN Witnessed patient trying to scoot out of bed. Chartered loss adjuster and RN pulled patient up in bed and applied fall risk arm band along with reinforcement teaching on staying in bed and the dangers of falling. Pt expressed understanding education. Pt reeducated on the use of the call bell for needs. Pt in NAD and denies CP, SOB, N/V/D and any further needs at this time. Bed locked in lowest position call bell within reach.

## 2022-02-15 NOTE — Discharge Instructions (Addendum)
[name] was seen in the emergency department after a fall.  We imaged his head, neck, and the entirety of his spine and he had no new injuries. His lab work was overall pretty unremarkable, except his urine did show that he has a urinary tract infection. We will treat this with antibiotics and I'd like him to have a repeat urinalysis in 10 days to evaluate the treatment course.   Continue to monitor how he's doing and return to the ER for any new or worsening symptoms.

## 2022-02-15 NOTE — ED Triage Notes (Signed)
Patent presented to ed with c/o fall. Patient fell off the bed this morning. Patient is c/o head pain.  hx of dementia.

## 2022-02-15 NOTE — ED Provider Notes (Signed)
[facility] DEPT Provider Note   CSN: [id number] Arrival date & time: 02/15/22  [id number]     History  No chief complaint on file.   [name] is a 56 y.o. male with a history of anoxic brain injury, atrial fibrillation, dementia, HLD, quadriplegia who presents to the emergency department from his living facility after a fall.  Patient resides at [facility].  Level 5 caveat due to dementia.  I discussed with his nurse from the facility on the phone, she reports that at the start of her shift at 730 this morning she was called to the patient's room.  She found him laying on the floor and holding his head, complaining that his head hurt.  She states that he was doing well yesterday and had no complaints.  The history is provided by the patient and the nursing home. History limited by: Dementia. No language interpreter was used.     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 Authorizing Provider  acetaminophen (TYLENOL) 325 MG tablet Take 650 mg by mouth every 4 (four) hours as needed for mild pain.    [provider]  aspirin 81 MG chewable tablet Chew 81 mg by mouth daily.    [provider]  Carboxymeth-Glycerin-Polysorb  (REFRESH OPTIVE ADVANCED OP) Place 1 drop into both eyes in the morning, at noon, and at bedtime.    [provider]  ciprofloxacin (CIPRO) 500 MG tablet Take 1 tablet (500 mg total) by mouth every 12 (twelve) hours for 10 days. 02/15/22 02/25/22 Yes [place] Kappes T, PA-C  clonazePAM (KLONOPIN) 0.5 MG tablet Take 0.5-1 mg by mouth See admin instructions. Take one tablet by mouth in the morning, then take 2 tablets by mouth at bedtime    [provider]  divalproex (DEPAKOTE SPRINKLE) 125 MG capsule Take 500 mg by mouth 2 (two) times daily.  07/18/13   [provider]  levothyroxine (SYNTHROID) 175 MCG tablet Take 175 mcg by mouth daily.    [provider]  OLANZapine (ZYPREXA) 5 MG tablet Take 5 mg by mouth at bedtime.    [provider]  pantoprazole sodium (PROTONIX) 40 mg/20 mL PACK Take 40 mg by mouth daily at 12 noon. 11/30/11   Ernest Haber, MD  polyethylene glycol (MIRALAX / GLYCOLAX) packet Take 17 g by mouth 2 (two) times daily.    [provider]  senna (SENOKOT) 8.6 MG tablet Take 2 tablets by mouth 2 (two) times daily.    [provider]  sertraline (ZOLOFT) 50 MG tablet Take 75  mg by mouth daily.    [provider]  traMADol (ULTRAM) 50 MG tablet Take 50 mg by mouth every 6 (six) hours as needed for moderate pain.    [provider]  traZODone (DESYREL) 50 MG tablet Take 50 mg by mouth at bedtime.    [provider]      Allergies    Ativan [lorazepam], Latex, Morphine and related, Penicillins, Pyridium [phenazopyridine hcl], and Soy allergy    Review of Systems   Review of Systems  Unable to perform ROS: Dementia  Musculoskeletal:        Head pain and back pain   Physical Exam Updated Vital Signs BP 120/77    Pulse 62    Temp 98.5 F (36.9 C) (Oral)    Resp (!) 25    SpO2 94%  Physical Exam Vitals and nursing note reviewed.  Constitutional:      Appearance: He is cachectic.      Comments: Patient able to follow basic commands  HENT:     Head: Normocephalic and atraumatic.  Eyes:     Conjunctiva/sclera: Conjunctivae normal.  Cardiovascular:     Rate and Rhythm: Normal rate and regular rhythm.  Pulmonary:     Effort: Pulmonary effort is normal. No respiratory distress.     Breath sounds: Normal breath sounds.  Abdominal:     General: There is no distension.     Palpations: Abdomen is soft.     Tenderness: There is no abdominal tenderness.  Musculoskeletal:     Comments: No midline spinal tenderness, step-offs or crepitus  Skin:    General: Skin is warm and dry.  Neurological:     General: No focal deficit present.     Mental Status: Mental status is at baseline.     Comments: Normal grip strength bilaterally, can follow basic commands and wiggle toes  Psychiatric:        Behavior: Behavior is cooperative.    ED Results / Procedures / Treatments   Labs (all labs ordered are listed, but only abnormal results are displayed) Labs Reviewed  CBC WITH DIFFERENTIAL/PLATELET - Abnormal; Notable for the following components:      Result Value   Platelets 144 (*)    All other components within normal limits  COMPREHENSIVE METABOLIC PANEL - Abnormal; Notable for the following components:   Calcium 8.6 (*)    Albumin 3.4 (*)    AST 12 (*)    All other components within normal limits  URINALYSIS, ROUTINE W REFLEX MICROSCOPIC - Abnormal; Notable for the following components:   APPearance CLOUDY (*)    Ketones, ur 5 (*)    Nitrite POSITIVE (*)    Leukocytes,Ua LARGE (*)    WBC, UA >50 (*)    All other components within normal limits  URINE CULTURE    EKG None  Radiology CT Head Wo Contrast  Result Date: 02/15/2022 CLINICAL DATA:  Head trauma, abnormal mental status (Age 4-64y); Polytrauma, blunt; Facial trauma, blunt EXAM: CT HEAD WITHOUT CONTRAST CT MAXILLOFACIAL WITHOUT CONTRAST CT CERVICAL SPINE WITHOUT CONTRAST TECHNIQUE: Multidetector CT imaging of  the head, cervical spine, and maxillofacial structures were performed using the standard protocol without intravenous contrast. Multiplanar CT image reconstructions of the cervical spine and maxillofacial structures were also generated. RADIATION DOSE REDUCTION: This exam was performed according to the departmental dose-optimization program which includes automated exposure control, adjustment of the mA and/or kV according to patient size and/or use of iterative reconstruction technique. COMPARISON:  CT head, face, and cervical spine 01/11/2022 FINDINGS: CT HEAD FINDINGS Brain: No evidence of acute intracranial hemorrhage or extra-axial collection. The ventricles are unchanged in size.Scattered subcortical and periventricular white matter hypodensities, nonspecific but likely sequela of chronic small vessel ischemic disease.Mild cerebral atrophy Vascular: Vascular calcifications.  No hyperdense vessel. Skull: Negative for skull fracture. Hyperostosis frontalis internus. Other: Mild residual left forehead scalp swelling from prior injury. CT MAXILLOFACIAL FINDINGS Osseous: Chronic depressed nasal bone deformities bilaterally. Chronic depressed left lamina papyracea deformity. No evidence of acute fracture. Orbits: Negative for trauma or inflammatory changes. Sinuses: Minimal ethmoid air cell mucosal thickening. Minimal bilateral maxillary sinus mucosal thickening. Soft tissues: Minimal residual soft tissue swelling along the left forehead from prior injury. CT CERVICAL SPINE FINDINGS Alignment: Trace anterolisthesis at C2-C3. Skull base and vertebrae: There is no evidence of acute cervical spine fracture. No aggressive osseous lesion. Soft tissues and spinal canal: No prevertebral fluid or swelling. No visible canal hematoma. Disc levels: There is moderate multilevel degenerative disc disease and mild to moderate multilevel facet arthropathy the with varying degrees of spinal canal and neural foraminal stenosis,  unchanged from prior exam. Upper chest: Negative Other: None IMPRESSION: No acute intracranial abnormality. Sequela of chronic small vessel ischemic disease. No acute facial fracture. Mild residual left forehead soft tissue swelling from prior injury. Chronic depressed nasal bone deformities and left lamina papyracea deformity. No acute cervical spine fracture. Multilevel degenerative disc disease and facet arthropathy with varying degrees of spinal canal neural foraminal stenosis, unchanged from prior exam. Electronically Signed   By: Maurine Simmering M.D.   On: 02/15/2022 10:49   CT Cervical Spine Wo Contrast  Result Date: 02/15/2022 CLINICAL DATA:  Head trauma, abnormal mental status (Age 27-64y); Polytrauma, blunt; Facial trauma, blunt EXAM: CT HEAD WITHOUT CONTRAST CT MAXILLOFACIAL WITHOUT CONTRAST CT CERVICAL SPINE WITHOUT CONTRAST TECHNIQUE: Multidetector CT imaging of the head, cervical spine, and maxillofacial structures were performed using the standard protocol without intravenous contrast. Multiplanar CT image reconstructions of the cervical spine and maxillofacial structures were also generated. RADIATION DOSE REDUCTION: This exam was performed according to the departmental dose-optimization program which includes automated exposure control, adjustment of the mA and/or kV according to patient size and/or use of iterative reconstruction technique. COMPARISON:  CT head, face, and cervical spine 01/11/2022 FINDINGS: CT HEAD FINDINGS Brain: No evidence of acute intracranial hemorrhage or extra-axial collection. The ventricles are unchanged in size.Scattered subcortical and periventricular white matter hypodensities, nonspecific but likely sequela of chronic small vessel ischemic disease.Mild cerebral atrophy Vascular: Vascular calcifications.  No hyperdense vessel. Skull: Negative for skull fracture. Hyperostosis frontalis internus. Other: Mild residual left forehead scalp swelling from prior injury. CT  MAXILLOFACIAL FINDINGS Osseous: Chronic depressed nasal bone deformities bilaterally. Chronic depressed left lamina papyracea deformity. No evidence of acute fracture. Orbits: Negative for trauma or inflammatory changes. Sinuses: Minimal ethmoid air cell mucosal thickening. Minimal bilateral maxillary sinus mucosal thickening. Soft tissues: Minimal residual soft tissue swelling along the left forehead from prior injury. CT CERVICAL SPINE FINDINGS Alignment: Trace anterolisthesis at C2-C3. Skull base and vertebrae: There is no evidence of acute cervical spine fracture. No aggressive osseous lesion. Soft tissues and spinal canal: No prevertebral fluid or swelling. No visible canal hematoma. Disc levels: There is moderate multilevel degenerative disc disease and mild to moderate multilevel facet arthropathy the with varying degrees of spinal canal and neural foraminal stenosis, unchanged from prior exam. Upper chest: Negative Other: None IMPRESSION: No acute intracranial abnormality. Sequela of chronic small vessel  ischemic disease. No acute facial fracture. Mild residual left forehead soft tissue swelling from prior injury. Chronic depressed nasal bone deformities and left lamina papyracea deformity. No acute cervical spine fracture. Multilevel degenerative disc disease and facet arthropathy with varying degrees of spinal canal neural foraminal stenosis, unchanged from prior exam. Electronically Signed   By: Maurine Simmering M.D.   On: 02/15/2022 10:49   CT Thoracic Spine Wo Contrast  Result Date: 02/15/2022 CLINICAL DATA:  polytrauma EXAM: CT THORACIC AND LUMBAR SPINE WITHOUT CONTRAST TECHNIQUE: Multidetector CT imaging of the thoracic and lumbar spine was performed without contrast. Multiplanar CT image reconstructions were also generated. RADIATION DOSE REDUCTION: This exam was performed according to the departmental dose-optimization program which includes automated exposure control, adjustment of the mA and/or kV  according to patient size and/or use of iterative reconstruction technique. COMPARISON:  CT abdomen pelvis 07/28/2021 FINDINGS: CT THORACIC SPINE FINDINGS Alignment: Dextroconvex curvature.  No listhesis. Vertebrae: No acute fracture or focal pathologic process. Paraspinal and other soft tissues: Chronically elevated right hemidiaphragm. Bibasilar hypoventilatory changes. Disc levels: There is mild to moderate multilevel degenerative disc disease with asymmetric disc height loss in the midthoracic spine. There is mild to moderate multilevel facet arthropathy. No visible impingement on CT. CT LUMBAR SPINE FINDINGS Segmentation: 5 lumbar type vertebrae. Alignment: Chronic bilateral L5 pars defects with grade 1 anterolisthesis at L5-S1. Vertebrae: No acute lumbar spine fracture. Chronic left L3 and L4 transverse process injuries. No aggressive osseous lesion. Paraspinal and other soft tissues: Prior right nephrectomy. Disc levels: There is moderate severe multilevel facet arthropathy worst at L4-L5 and L5-S1. There is mild disc height loss at L4-L5. There is moderate to severe disc height loss at L5-S1 with grade 1 anterolisthesis and probable bilateral neural foraminal stenosis. Probable mild-to-moderate bilateral neural foraminal stenosis at L4-L5 and L3-L4 predominantly due to bony spurring. IMPRESSION: CT THORACIC SPINE IMPRESSION No acute thoracic spine fracture. Mild-to-moderate multilevel degenerative changes. CT LUMBAR SPINE IMPRESSION No acute lumbar spine fracture. Chronic left L3 and L4 transverse process injuries. Chronic bilateral L5 pars defects and grade 1 anterolisthesis at L5-S1. Moderate to severe multilevel facet arthropathy worst at L4-L5 and L5-S1 with probable neural foraminal stenosis bilaterally at L3-L4, L4-L5, and L5-S1. Electronically Signed   By: Maurine Simmering M.D.   On: 02/15/2022 10:59   CT Lumbar Spine Wo Contrast  Result Date: 02/15/2022 CLINICAL DATA:  polytrauma EXAM: CT THORACIC AND  LUMBAR SPINE WITHOUT CONTRAST TECHNIQUE: Multidetector CT imaging of the thoracic and lumbar spine was performed without contrast. Multiplanar CT image reconstructions were also generated. RADIATION DOSE REDUCTION: This exam was performed according to the departmental dose-optimization program which includes automated exposure control, adjustment of the mA and/or kV according to patient size and/or use of iterative reconstruction technique. COMPARISON:  CT abdomen pelvis 07/28/2021 FINDINGS: CT THORACIC SPINE FINDINGS Alignment: Dextroconvex curvature.  No listhesis. Vertebrae: No acute fracture or focal pathologic process. Paraspinal and other soft tissues: Chronically elevated right hemidiaphragm. Bibasilar hypoventilatory changes. Disc levels: There is mild to moderate multilevel degenerative disc disease with asymmetric disc height loss in the midthoracic spine. There is mild to moderate multilevel facet arthropathy. No visible impingement on CT. CT LUMBAR SPINE FINDINGS Segmentation: 5 lumbar type vertebrae. Alignment: Chronic bilateral L5 pars defects with grade 1 anterolisthesis at L5-S1. Vertebrae: No acute lumbar spine fracture. Chronic left L3 and L4 transverse process injuries. No aggressive osseous lesion. Paraspinal and other soft tissues: Prior right nephrectomy. Disc levels: There is moderate severe multilevel facet  arthropathy worst at L4-L5 and L5-S1. There is mild disc height loss at L4-L5. There is moderate to severe disc height loss at L5-S1 with grade 1 anterolisthesis and probable bilateral neural foraminal stenosis. Probable mild-to-moderate bilateral neural foraminal stenosis at L4-L5 and L3-L4 predominantly due to bony spurring. IMPRESSION: CT THORACIC SPINE IMPRESSION No acute thoracic spine fracture. Mild-to-moderate multilevel degenerative changes. CT LUMBAR SPINE IMPRESSION No acute lumbar spine fracture. Chronic left L3 and L4 transverse process injuries. Chronic bilateral L5 pars  defects and grade 1 anterolisthesis at L5-S1. Moderate to severe multilevel facet arthropathy worst at L4-L5 and L5-S1 with probable neural foraminal stenosis bilaterally at L3-L4, L4-L5, and L5-S1. Electronically Signed   By: Caprice Renshaw M.D.   On: 02/15/2022 10:59   CT Maxillofacial Wo Contrast  Result Date: 02/15/2022 CLINICAL DATA:  Head trauma, abnormal mental status (Age 48-64y); Polytrauma, blunt; Facial trauma, blunt EXAM: CT HEAD WITHOUT CONTRAST CT MAXILLOFACIAL WITHOUT CONTRAST CT CERVICAL SPINE WITHOUT CONTRAST TECHNIQUE: Multidetector CT imaging of the head, cervical spine, and maxillofacial structures were performed using the standard protocol without intravenous contrast. Multiplanar CT image reconstructions of the cervical spine and maxillofacial structures were also generated. RADIATION DOSE REDUCTION: This exam was performed according to the departmental dose-optimization program which includes automated exposure control, adjustment of the mA and/or kV according to patient size and/or use of iterative reconstruction technique. COMPARISON:  CT head, face, and cervical spine 01/11/2022 FINDINGS: CT HEAD FINDINGS Brain: No evidence of acute intracranial hemorrhage or extra-axial collection. The ventricles are unchanged in size.Scattered subcortical and periventricular white matter hypodensities, nonspecific but likely sequela of chronic small vessel ischemic disease.Mild cerebral atrophy Vascular: Vascular calcifications.  No hyperdense vessel. Skull: Negative for skull fracture. Hyperostosis frontalis internus. Other: Mild residual left forehead scalp swelling from prior injury. CT MAXILLOFACIAL FINDINGS Osseous: Chronic depressed nasal bone deformities bilaterally. Chronic depressed left lamina papyracea deformity. No evidence of acute fracture. Orbits: Negative for trauma or inflammatory changes. Sinuses: Minimal ethmoid air cell mucosal thickening. Minimal bilateral maxillary sinus mucosal  thickening. Soft tissues: Minimal residual soft tissue swelling along the left forehead from prior injury. CT CERVICAL SPINE FINDINGS Alignment: Trace anterolisthesis at C2-C3. Skull base and vertebrae: There is no evidence of acute cervical spine fracture. No aggressive osseous lesion. Soft tissues and spinal canal: No prevertebral fluid or swelling. No visible canal hematoma. Disc levels: There is moderate multilevel degenerative disc disease and mild to moderate multilevel facet arthropathy the with varying degrees of spinal canal and neural foraminal stenosis, unchanged from prior exam. Upper chest: Negative Other: None IMPRESSION: No acute intracranial abnormality. Sequela of chronic small vessel ischemic disease. No acute facial fracture. Mild residual left forehead soft tissue swelling from prior injury. Chronic depressed nasal bone deformities and left lamina papyracea deformity. No acute cervical spine fracture. Multilevel degenerative disc disease and facet arthropathy with varying degrees of spinal canal neural foraminal stenosis, unchanged from prior exam. Electronically Signed   By: Caprice Renshaw M.D.   On: 02/15/2022 10:49    Procedures Procedures    Medications Ordered in ED Medications  acetaminophen (TYLENOL) tablet 650 mg (650 mg Oral Given 02/15/22 1133)    ED Course/ Medical Decision Making/ A&P                           Medical Decision Making This patient is a 56 year old male who presents to the ED for concern of fall with head trauma.  Differential diagnoses prior to evaluation: The emergent differential diagnosis includes, but is not limited to,  Neurologic causes (CVA, spinal cord injury); ACS, Arrhythmia, syncope, orthostatic hypotension, sepsis, hypoglycemia, electrolyte disturbance, respiratory failure, symptomatic anemia, dehydration.  Concern for acute fractures, dislocations, subdural or epidural bleeds.  Past Medical History / Co-morbidities: History of anoxic  brain injury, atrial fibrillation, dementia, HLD, quadriplegia  Physical Exam: Physical exam performed. The pertinent findings include: Head and face are atraumatic.  No midline spinal tenderness, step-offs or crepitus.  Patient is afebrile, not tachycardic, and in no acute distress.  He is chronically cachectic appearing.  He is able to follow basic commands, and appears that his baseline.  Lab Tests I Ordered, and personally interpreted labs/imaging including CBC, CMP, urinalysis.  The pertinent results include: No leukocytosis, normal hemoglobin, electrolytes grossly within normal limits.  Urinalysis positive for urinary tract infection with positive nitrites and large leukocytes.   Imaging studies: Due to concern of head pain and back pain and a poor historian due to dementia, ordered scans of head, maxillofacial, entirety of spine.   Imaging shows some persistent scalp swelling from previous injury when compared to prior images, no acute fractures, dislocations, or any other findings. I personally viewed and interpreted the images and I agree with the radiologist interpretation.   Medications: I ordered medication including tylenol  for back pain.  I have reviewed the patients home medicines and have made adjustments as needed.   Disposition: After consideration of the diagnostic results and the patients response to treatment, I feel that patient is not requiring admission or inpatient treatment for his symptoms.  I did not find any traumatic injuries that would require hospitalization.  We will treat his urinary tract infection with antibiotics.  He is afebrile with otherwise normal labs, and I have low concern for developing worsening infection. We will discharge back to his facility with prescription for antibiotics.   Final Clinical Impression(s) / ED Diagnoses Final diagnoses:  Fall, initial encounter  Acute cystitis without hematuria    Rx / DC Orders ED Discharge Orders           Ordered    ciprofloxacin (CIPRO) 500 MG tablet  Every 12 hours        02/15/22 1219           Portions of this report may have been transcribed using voice recognition software. Every effort was made to ensure accuracy; however, inadvertent computerized transcription errors may be present.    Estill Cotta 02/15/22 1223    Valarie Merino, MD 02/15/22 1445

## 2022-02-15 NOTE — ED Notes (Signed)
Provider at bedside

## 2022-02-16 LAB — URINE CULTURE

## 2022-03-20 IMAGING — CT CT HEAD W/O CM
3 of 4 series · 14 of 47 positions shown, 16 images · non-contrast
Comparison: CT head, face, and cervical spine 01/11/2022

CLINICAL DATA: Head trauma, abnormal mental status (Age 18-64y);
Polytrauma, blunt; Facial trauma, blunt



[Series 1: head wo · axial · 0.47mm/px · z∈[+1508,+1638]mm · 8 of 30 slices shown, 10 images]
[im 2/30  brain]
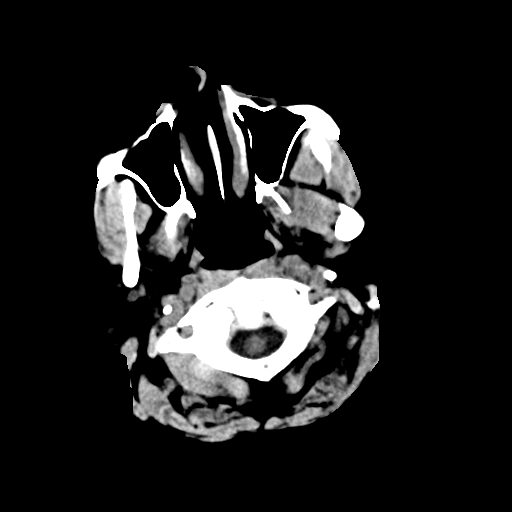
[im 2/30  bone]
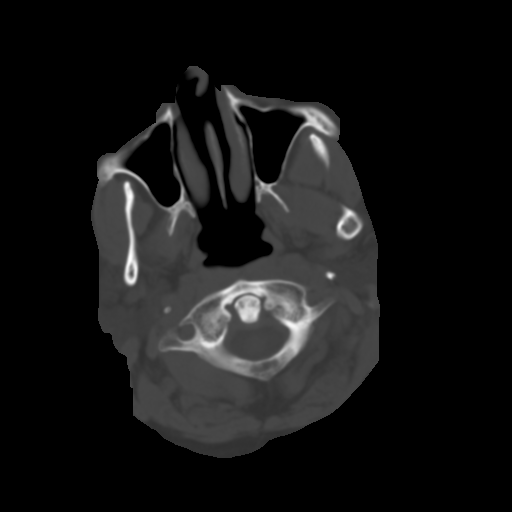
[im 6/30  brain]
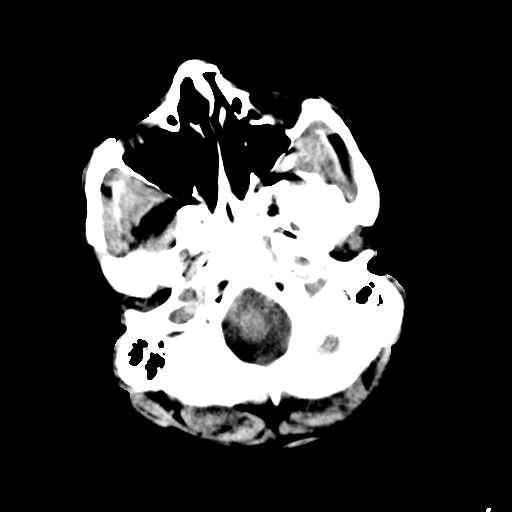
[im 10/30  brain]
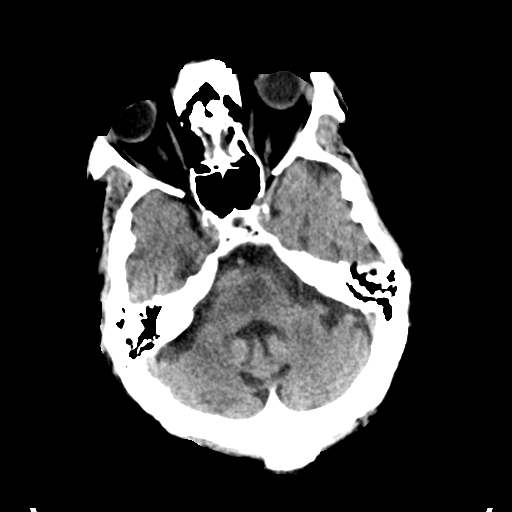
[im 13/30  brain]
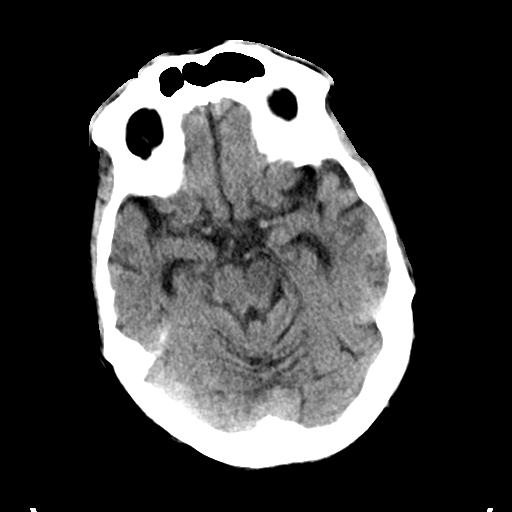
[im 17/30  brain]
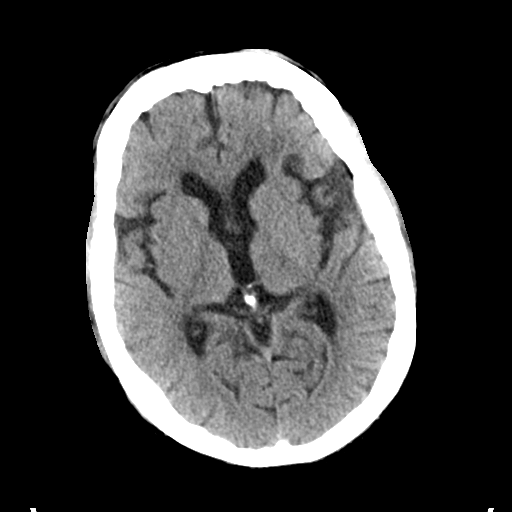
[im 17/30  bone]
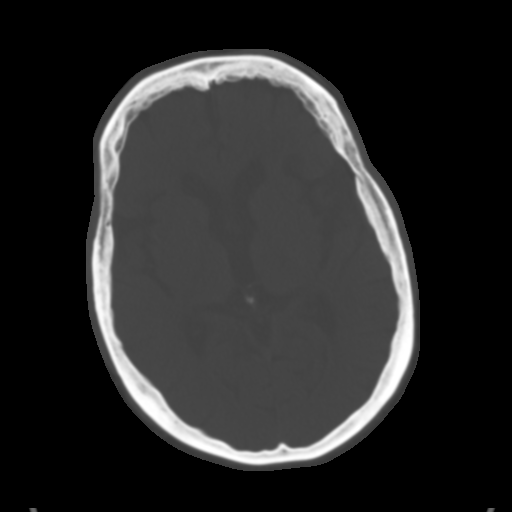
[im 20/30  brain]
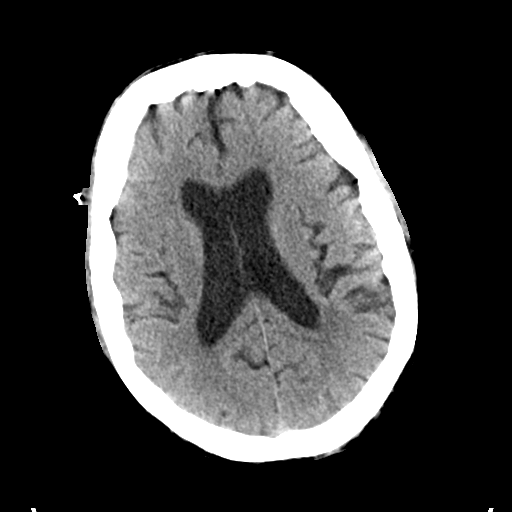
[im 24/30  brain]
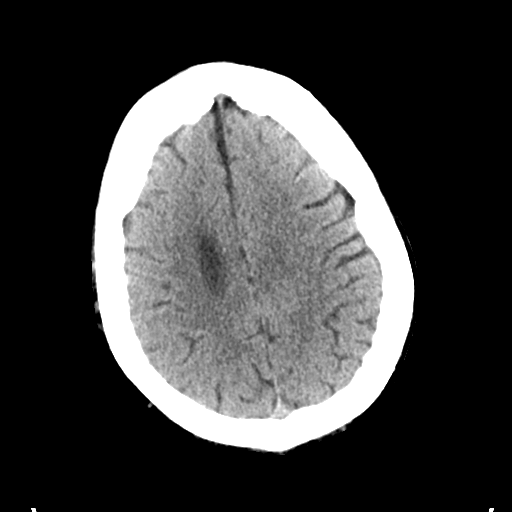
[im 28/30  brain]
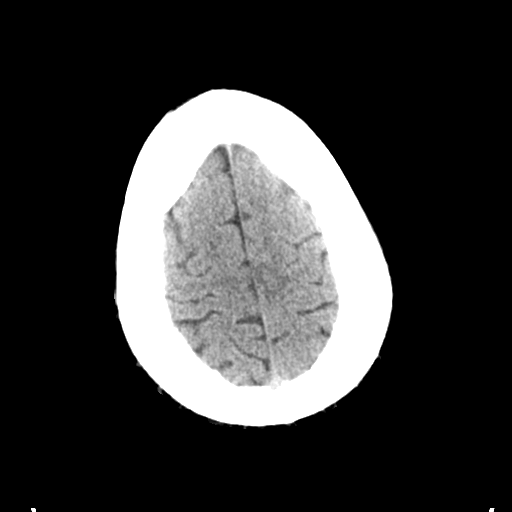

[Series 7: coronal soft tissue · coronal · 0.29mm/px · 3 of 72 slices shown]
[im 24/72  brain]
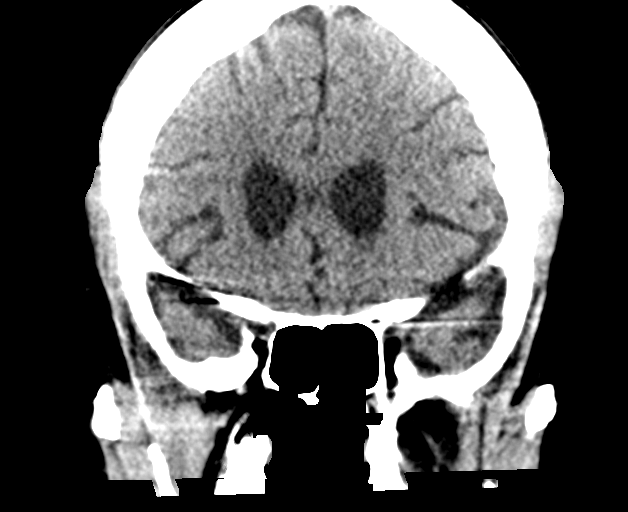
[im 32/72  brain]
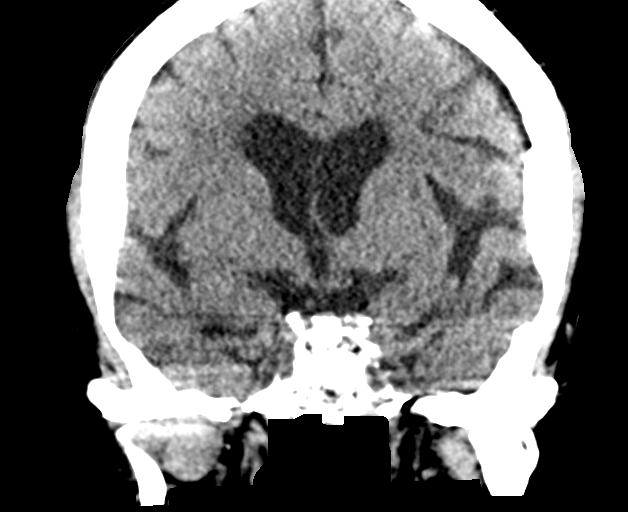
[im 40/72  brain]
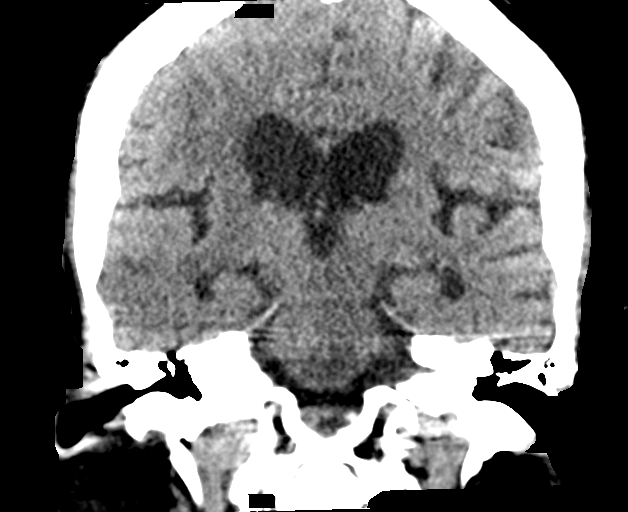

[Series 8: sagittal soft tissue · sagittal · 0.29mm/px · 3 of 61 slices shown]
[im 21/61  brain]
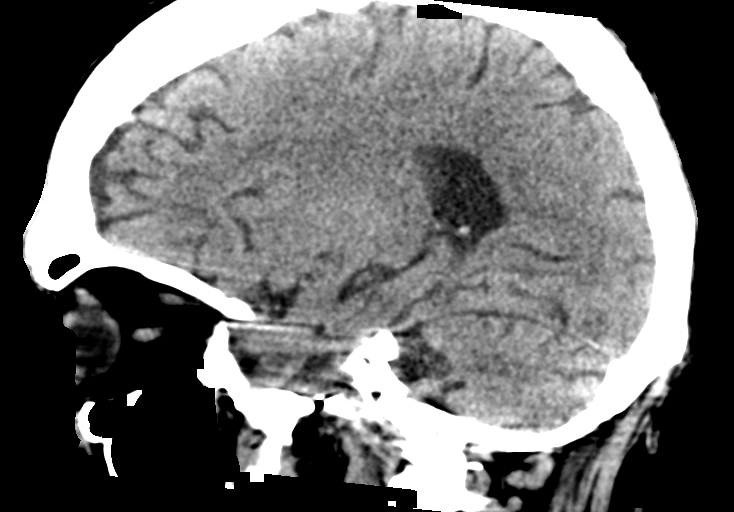
[im 31/61  brain]
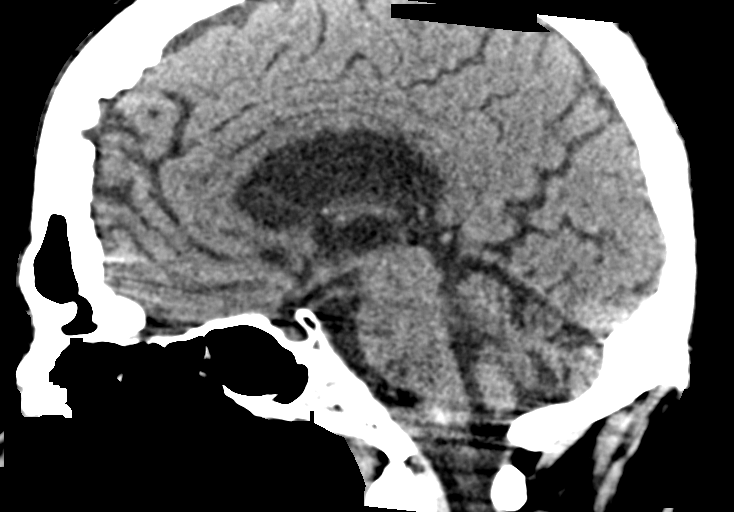
[im 41/61  brain]
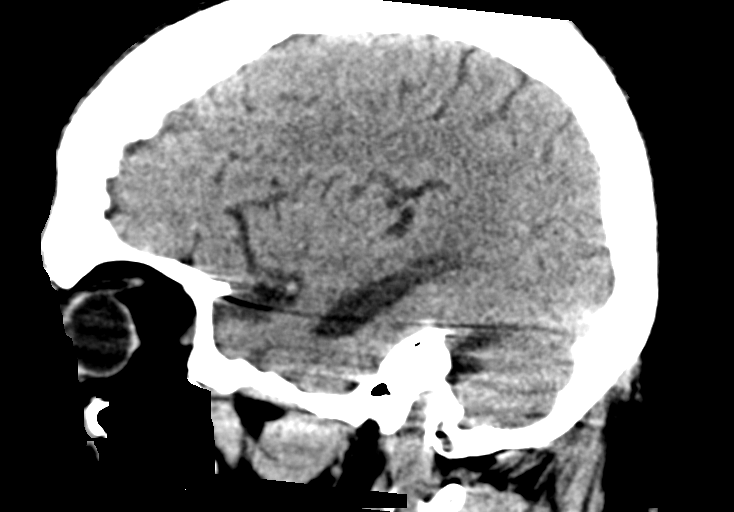

[14 of 47 positions shown; findings below may reference images not displayed]

FINDINGS: CT HEAD FINDINGS

Brain: No evidence of acute intracranial hemorrhage or extra-axial
collection. The ventricles are unchanged in size.Scattered
subcortical and periventricular white matter hypodensities,
nonspecific but likely sequela of chronic small vessel ischemic
disease.Mild cerebral atrophy

Vascular: Vascular calcifications.  No hyperdense vessel.

Skull: Negative for skull fracture. Hyperostosis frontalis internus.

Other: Mild residual left forehead scalp swelling from prior injury.

CT MAXILLOFACIAL FINDINGS

Osseous: Chronic depressed nasal bone deformities bilaterally.
Chronic depressed left lamina papyracea deformity. No evidence of
acute fracture.

Orbits: Negative for trauma or inflammatory changes.

Sinuses: Minimal ethmoid air cell mucosal thickening. Minimal
bilateral maxillary sinus mucosal thickening.

Soft tissues: Minimal residual soft tissue swelling along the left
forehead from prior injury.

CT CERVICAL SPINE FINDINGS

Alignment: Trace anterolisthesis at C2-C3.

Skull base and vertebrae: There is no evidence of acute cervical
spine fracture. No aggressive osseous lesion.

Soft tissues and spinal canal: No prevertebral fluid or swelling. No
visible canal hematoma.

Disc levels: There is moderate multilevel degenerative disc disease
and mild to moderate multilevel facet arthropathy the with varying
degrees of spinal canal and neural foraminal stenosis, unchanged
from prior exam.

Upper chest: Negative

Other: None
IMPRESSION: No acute intracranial abnormality. Sequela of chronic small vessel
ischemic disease.

No acute facial fracture. Mild residual left forehead soft tissue
swelling from prior injury. Chronic depressed nasal bone deformities
and left lamina papyracea deformity.

No acute cervical spine fracture. Multilevel degenerative disc
disease and facet arthropathy with varying degrees of spinal canal
neural foraminal stenosis, unchanged from prior exam.

## 2022-03-20 IMAGING — CT CT T SPINE W/O CM
3 of 4 series · 12 of 35 positions shown, 14 images · non-contrast
Comparison: CT abdomen pelvis 07/28/2021

CLINICAL DATA: polytrauma

EXAM:
CT THORACIC AND LUMBAR SPINE WITHOUT CONTRAST
TECHNIQUE: Multidetector CT imaging of the thoracic and lumbar spine was
performed without contrast. Multiplanar CT image reconstructions
were also generated.
RADIATION DOSE REDUCTION: This exam was performed according to the
departmental dose-optimization program which includes automated
exposure control, adjustment of the mA and/or kV according to
patient size and/or use of iterative reconstruction technique.

[Series 6: t spine st · axial · 0.29mm/px · z∈[+1128,+1360]mm · 5 of 174 slices shown, 7 images]
[im 29/174  soft-tissue]
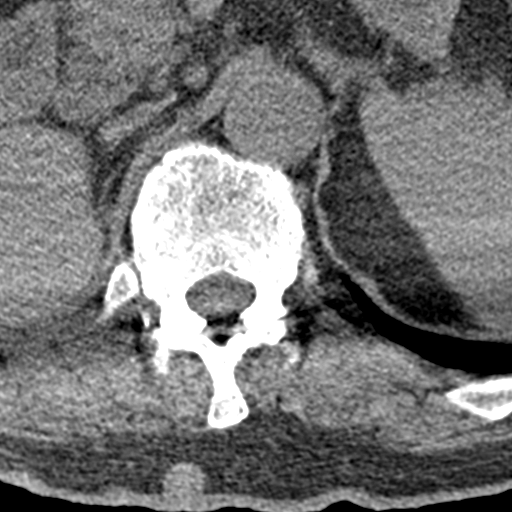
[im 29/174  bone]
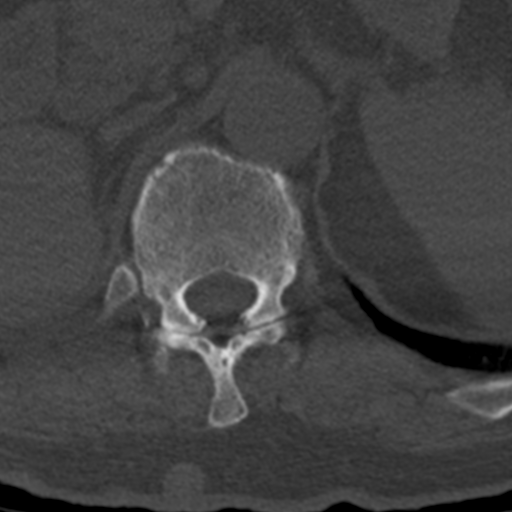
[im 58/174  bone]
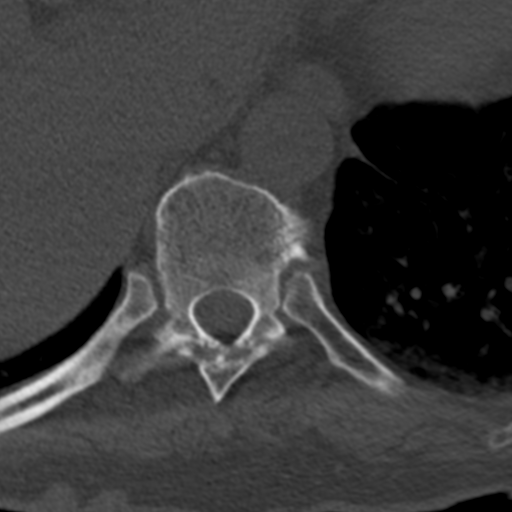
[im 87/174  bone]
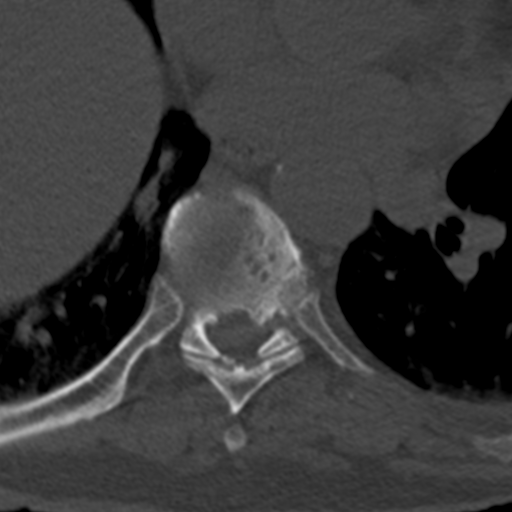
[im 116/174  bone]
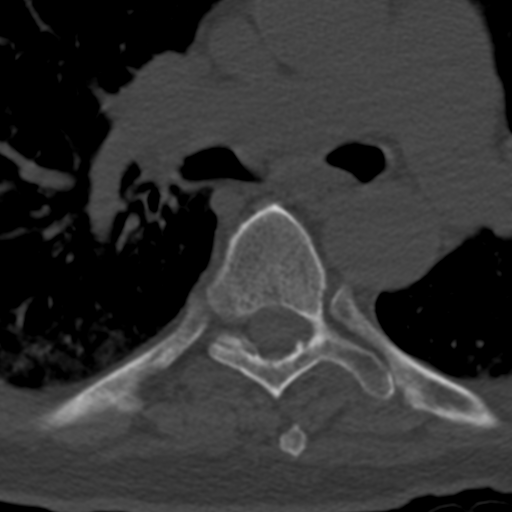
[im 145/174  soft-tissue]
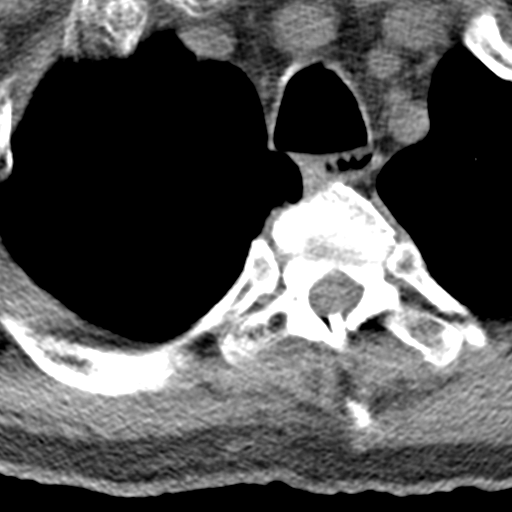
[im 145/174  bone]
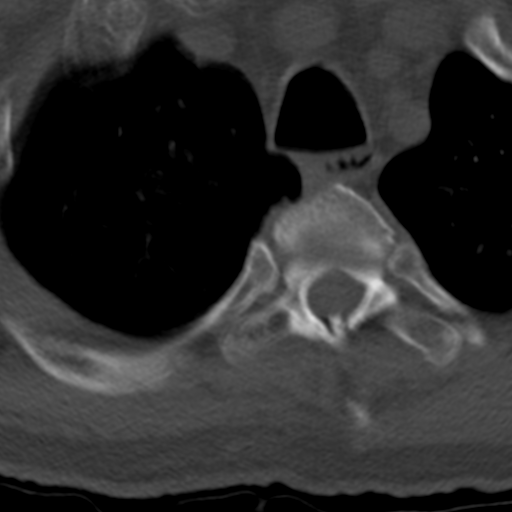

[Series 9: axial bone · axial · 0.28mm/px · z∈[+1138,+1311]mm · 4 of 176 slices shown]
[im 30/176  bone]
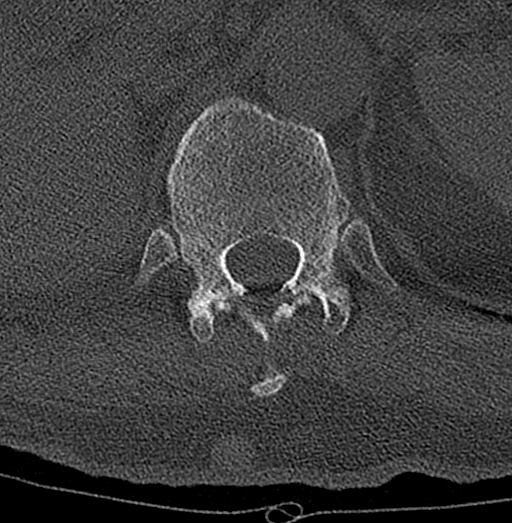
[im 59/176  bone]
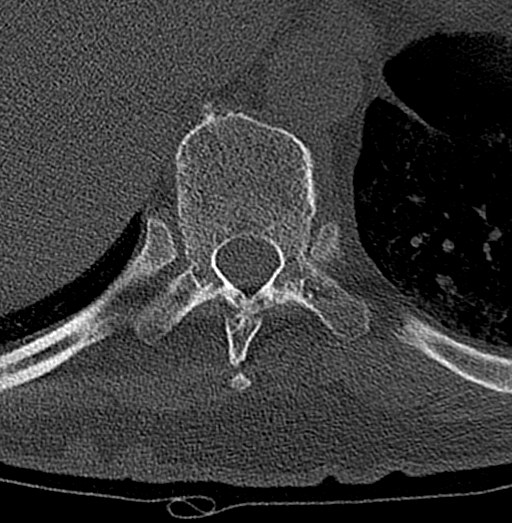
[im 88/176  bone]
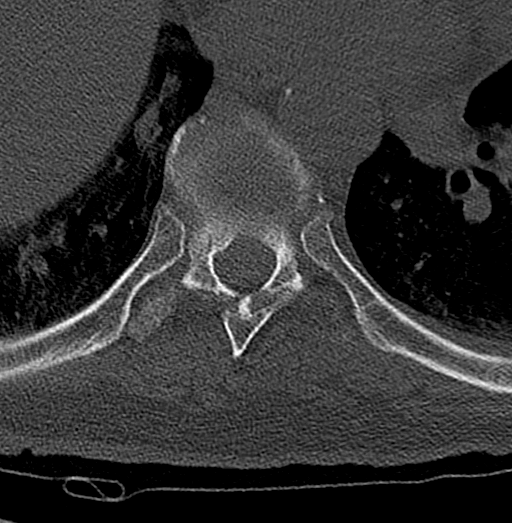
[im 117/176  bone]
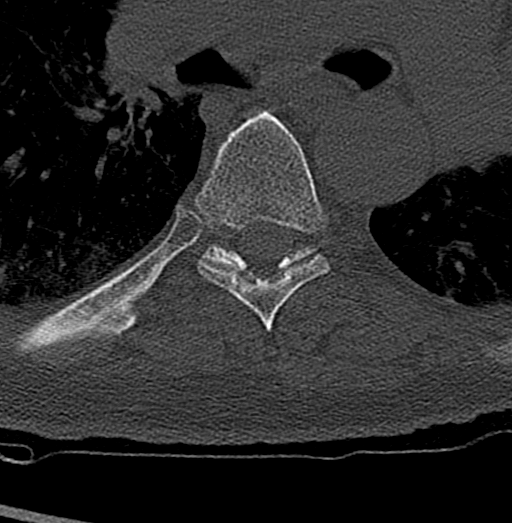

[Series 10: coronal bone · coronal · 0.28mm/px · 3 of 74 slices shown]
[im 15/74  bone]
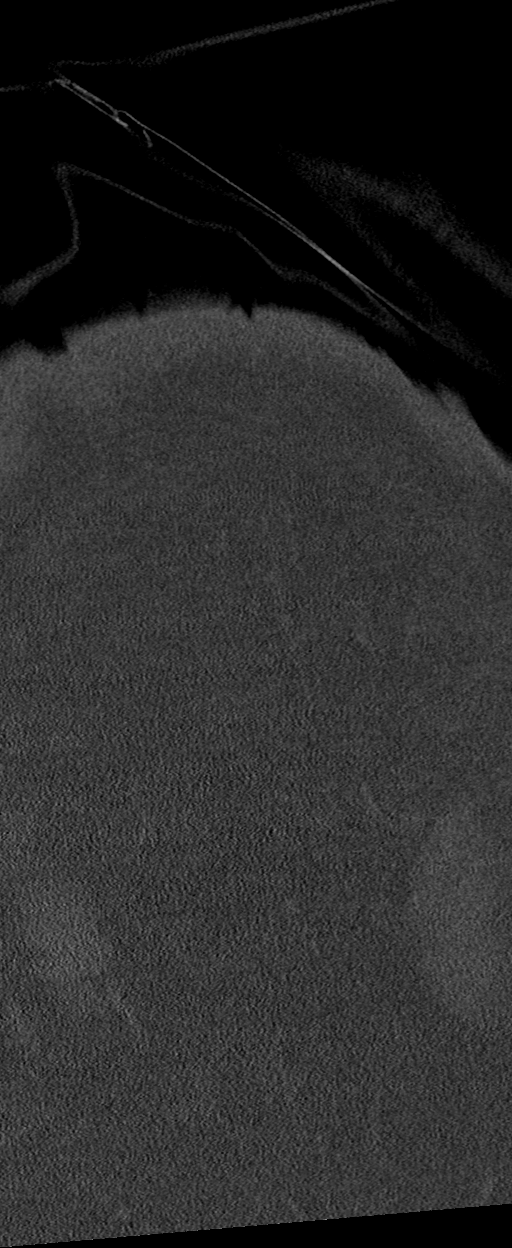
[im 30/74  bone]
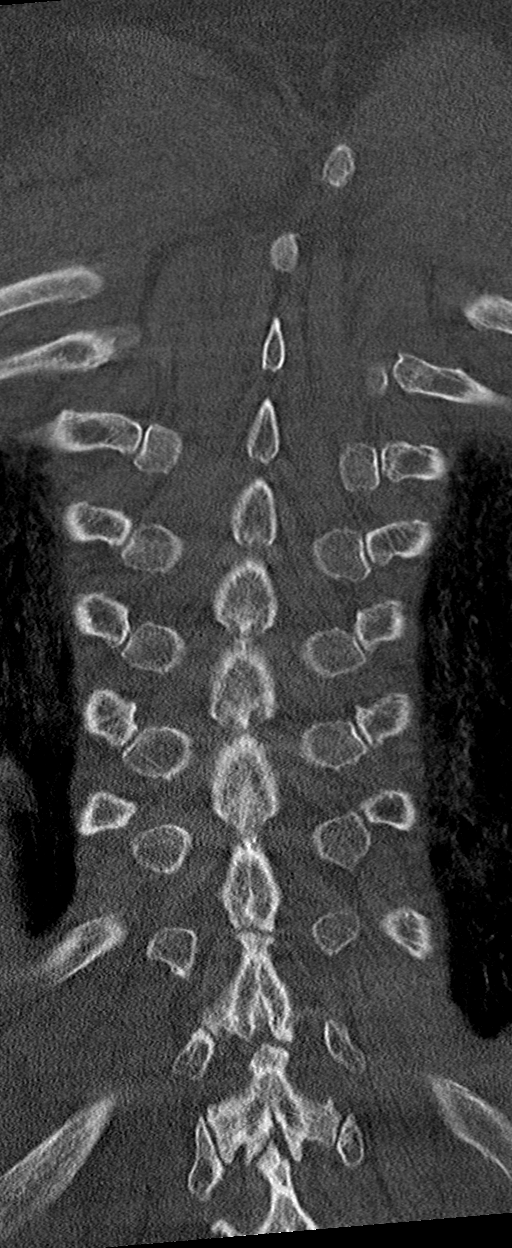
[im 44/74  bone]
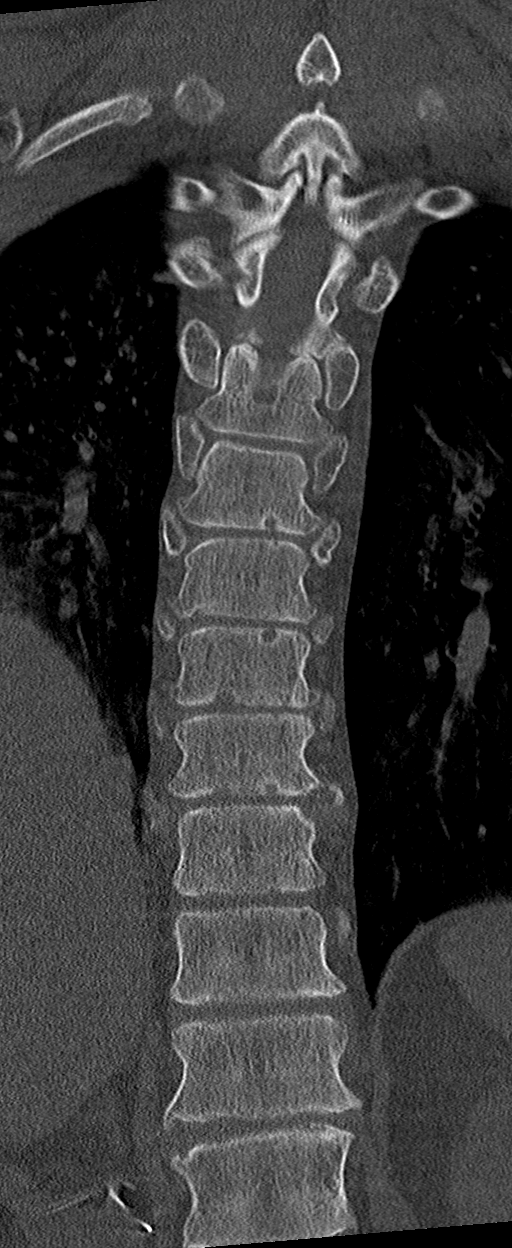

[12 of 35 positions shown; findings below may reference images not displayed]

FINDINGS: CT THORACIC SPINE FINDINGS

Alignment: Dextroconvex curvature.  No listhesis.

Vertebrae: No acute fracture or focal pathologic process.

Paraspinal and other soft tissues: Chronically elevated right
hemidiaphragm. Bibasilar hypoventilatory changes.

Disc levels: There is mild to moderate multilevel degenerative disc
disease with asymmetric disc height loss in the midthoracic spine.
There is mild to moderate multilevel facet arthropathy. No visible
impingement on CT.

CT LUMBAR SPINE FINDINGS

Segmentation: 5 lumbar type vertebrae.

Alignment: Chronic bilateral L5 pars defects with grade 1
anterolisthesis at L5-S1.

Vertebrae: No acute lumbar spine fracture. Chronic left L3 and L4
transverse process injuries. No aggressive osseous lesion.

Paraspinal and other soft tissues: Prior right nephrectomy.

Disc levels: There is moderate severe multilevel facet arthropathy
worst at L4-L5 and L5-S1. There is mild disc height loss at L4-L5.
There is moderate to severe disc height loss at L5-S1 with grade 1
anterolisthesis and probable bilateral neural foraminal stenosis.
Probable mild-to-moderate bilateral neural foraminal stenosis at
L4-L5 and L3-L4 predominantly due to bony spurring.
IMPRESSION: CT THORACIC SPINE IMPRESSION

No acute thoracic spine fracture. Mild-to-moderate multilevel
degenerative changes.

CT LUMBAR SPINE IMPRESSION

No acute lumbar spine fracture. Chronic left L3 and L4 transverse
process injuries.

Chronic bilateral L5 pars defects and grade 1 anterolisthesis at
L5-S1.

Moderate to severe multilevel facet arthropathy worst at L4-L5 and
L5-S1 with probable neural foraminal stenosis bilaterally at L3-L4,
L4-L5, and L5-S1.

## 2022-03-20 IMAGING — CT CT L SPINE W/O CM
3 of 4 series · 10 of 33 positions shown, 12 images · non-contrast
Comparison: CT abdomen pelvis 07/28/2021

CLINICAL DATA: polytrauma

EXAM:
CT THORACIC AND LUMBAR SPINE WITHOUT CONTRAST
TECHNIQUE: Multidetector CT imaging of the thoracic and lumbar spine was
performed without contrast. Multiplanar CT image reconstructions
were also generated.
RADIATION DOSE REDUCTION: This exam was performed according to the
departmental dose-optimization program which includes automated
exposure control, adjustment of the mA and/or kV according to
patient size and/or use of iterative reconstruction technique.

[Series 6: l spine st · axial · 0.32mm/px · z∈[+964,+1044]mm · 2 of 122 slices shown, 3 images]
[im 41/122  soft-tissue]
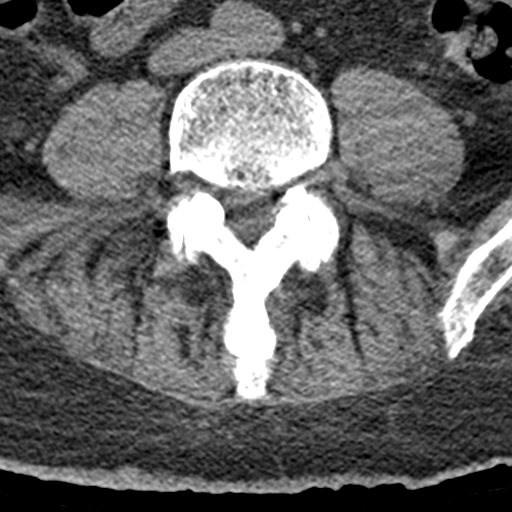
[im 41/122  bone]
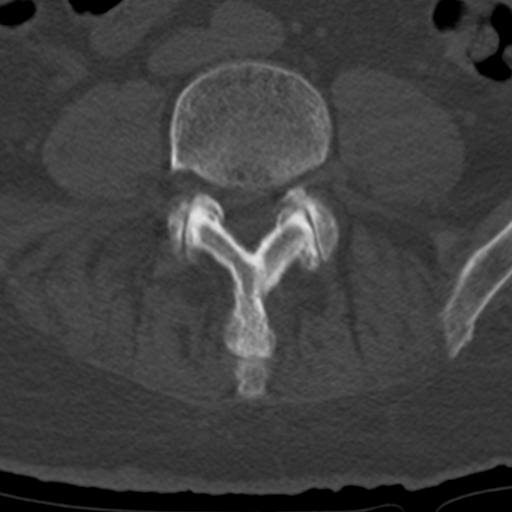
[im 81/122  bone]
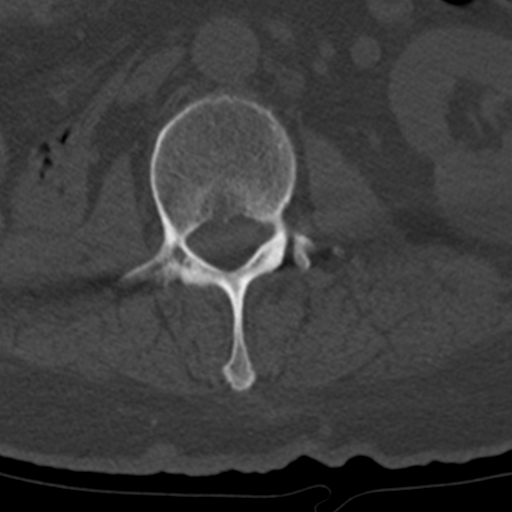

[Series 12: sagittal st · sagittal · 0.25mm/px · 5 of 73 slices shown, 6 images]
[im 25/73  bone]
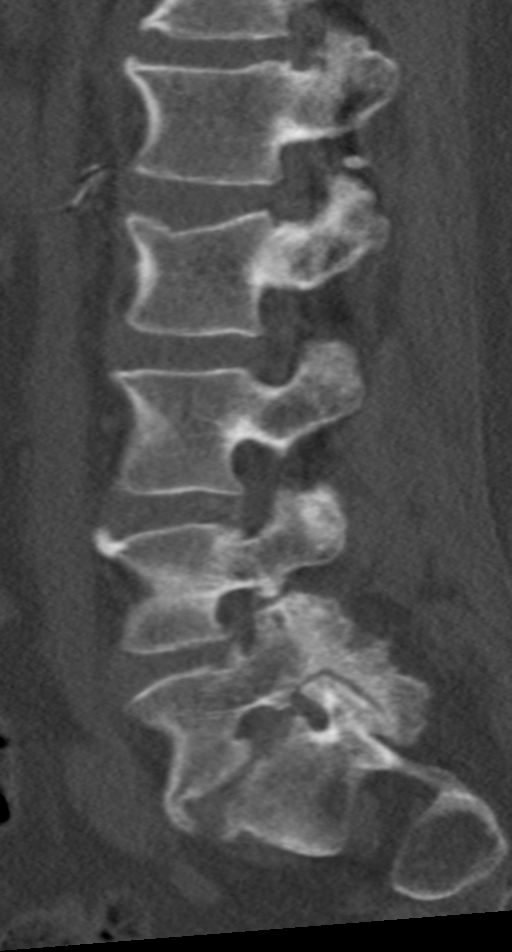
[im 31/73  bone]
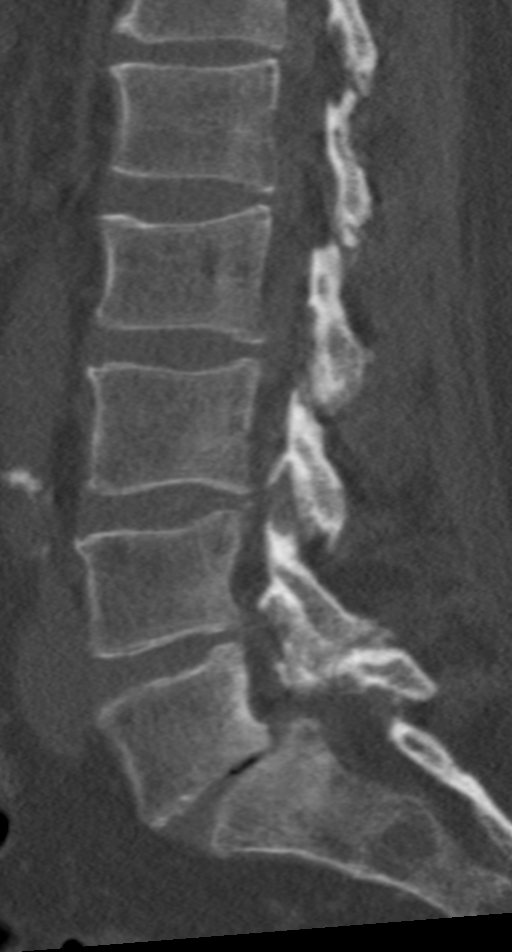
[im 37/73  soft-tissue]
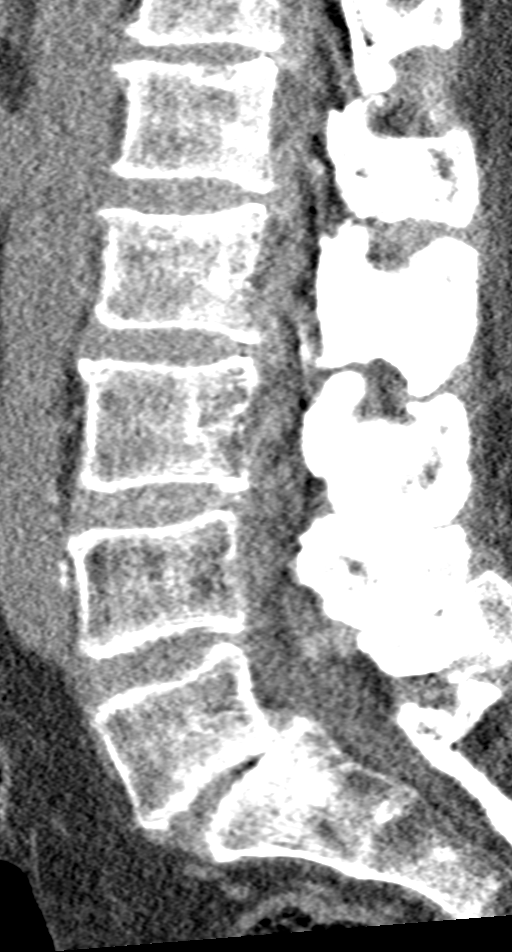
[im 37/73  bone]
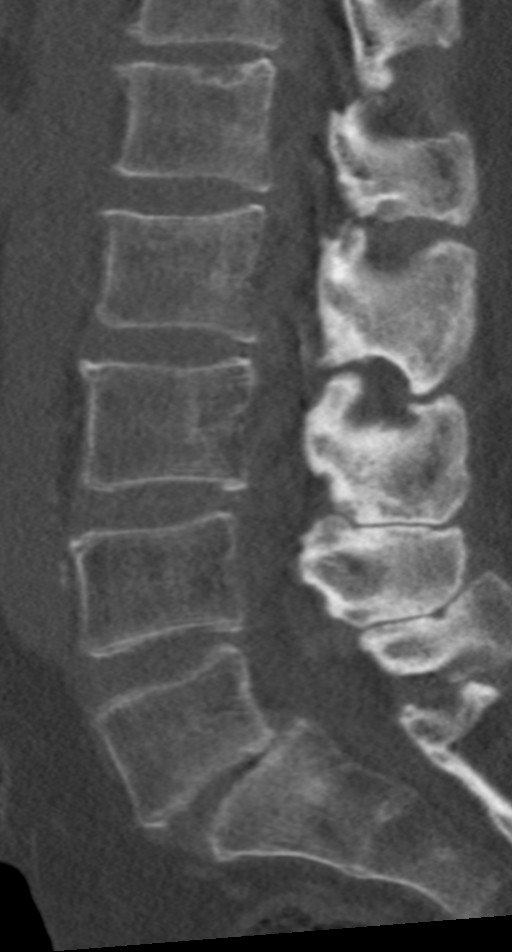
[im 43/73  bone]
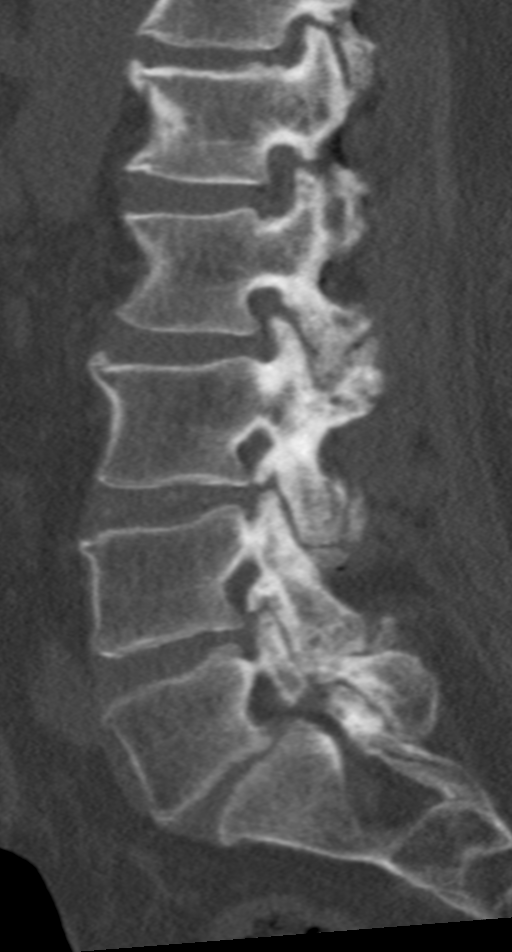
[im 49/73  bone]
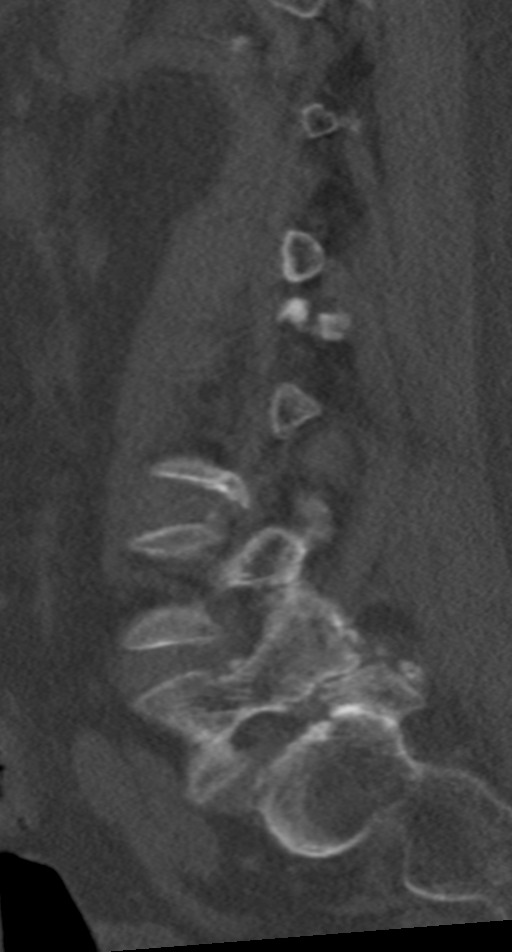

[Series 13: coronal st · coronal · 0.28mm/px · 3 of 65 slices shown]
[im 13/65  bone]
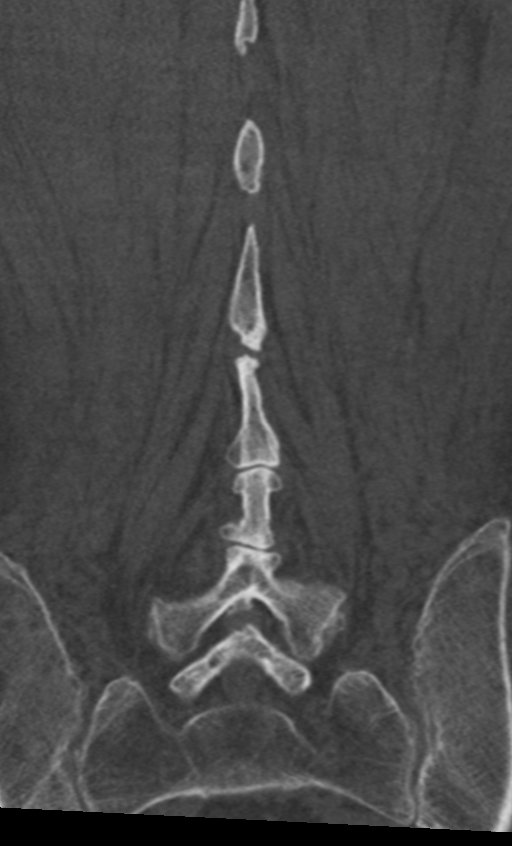
[im 26/65  bone]
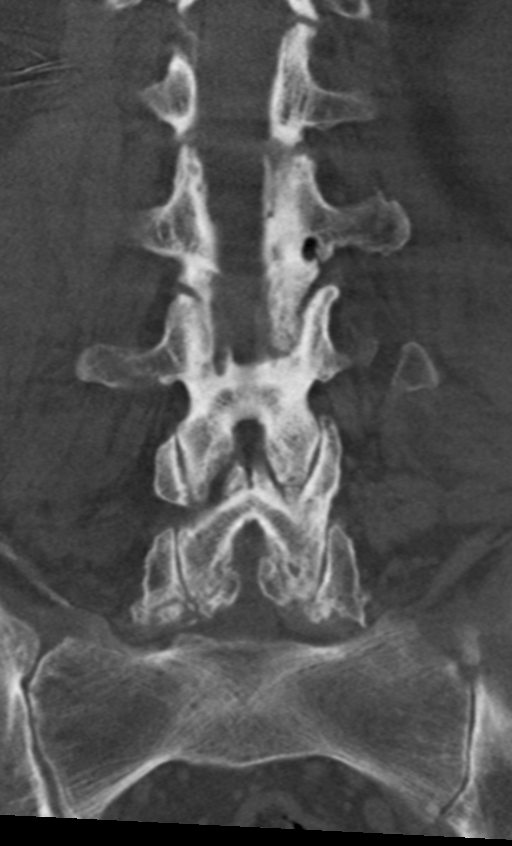
[im 39/65  bone]
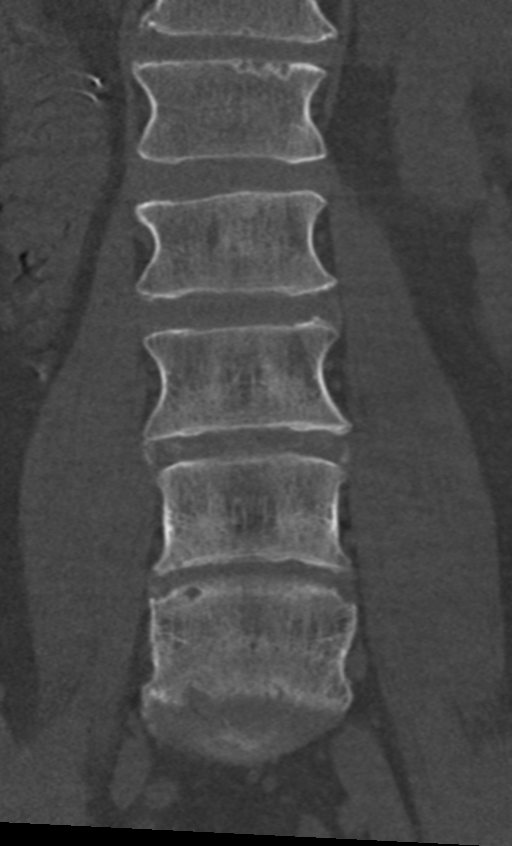

[10 of 33 positions shown; findings below may reference images not displayed]

FINDINGS: CT THORACIC SPINE FINDINGS

Alignment: Dextroconvex curvature.  No listhesis.

Vertebrae: No acute fracture or focal pathologic process.

Paraspinal and other soft tissues: Chronically elevated right
hemidiaphragm. Bibasilar hypoventilatory changes.

Disc levels: There is mild to moderate multilevel degenerative disc
disease with asymmetric disc height loss in the midthoracic spine.
There is mild to moderate multilevel facet arthropathy. No visible
impingement on CT.

CT LUMBAR SPINE FINDINGS

Segmentation: 5 lumbar type vertebrae.

Alignment: Chronic bilateral L5 pars defects with grade 1
anterolisthesis at L5-S1.

Vertebrae: No acute lumbar spine fracture. Chronic left L3 and L4
transverse process injuries. No aggressive osseous lesion.

Paraspinal and other soft tissues: Prior right nephrectomy.

Disc levels: There is moderate severe multilevel facet arthropathy
worst at L4-L5 and L5-S1. There is mild disc height loss at L4-L5.
There is moderate to severe disc height loss at L5-S1 with grade 1
anterolisthesis and probable bilateral neural foraminal stenosis.
Probable mild-to-moderate bilateral neural foraminal stenosis at
L4-L5 and L3-L4 predominantly due to bony spurring.
IMPRESSION: CT THORACIC SPINE IMPRESSION

No acute thoracic spine fracture. Mild-to-moderate multilevel
degenerative changes.

CT LUMBAR SPINE IMPRESSION

No acute lumbar spine fracture. Chronic left L3 and L4 transverse
process injuries.

Chronic bilateral L5 pars defects and grade 1 anterolisthesis at
L5-S1.

Moderate to severe multilevel facet arthropathy worst at L4-L5 and
L5-S1 with probable neural foraminal stenosis bilaterally at L3-L4,
L4-L5, and L5-S1.

## 2022-03-27 ENCOUNTER — Encounter (HOSPITAL_COMMUNITY): Payer: Self-pay

## 2022-03-27 ENCOUNTER — Emergency Department (HOSPITAL_COMMUNITY): Payer: Medicare Other

## 2022-03-27 ENCOUNTER — Emergency Department (HOSPITAL_COMMUNITY)
Admission: EM | Admit: 2022-03-27 | Discharge: 2022-03-27 | Disposition: A | Discharge status: Skilled Nursing Facility | Payer: Medicare Other | Attending: Emergency Medicine | Admitting: Emergency Medicine

## 2022-03-27 ENCOUNTER — Other Ambulatory Visit: Payer: Self-pay

## 2022-03-27 DIAGNOSIS — W1839XA Other fall on same level, initial encounter: Secondary | ICD-10-CM | POA: Insufficient documentation

## 2022-03-27 DIAGNOSIS — S0003XA Contusion of scalp, initial encounter: Secondary | ICD-10-CM | POA: Insufficient documentation

## 2022-03-27 DIAGNOSIS — Z79899 Other long term (current) drug therapy: Secondary | ICD-10-CM | POA: Diagnosis not present

## 2022-03-27 DIAGNOSIS — F0390 Unspecified dementia without behavioral disturbance: Secondary | ICD-10-CM | POA: Diagnosis not present

## 2022-03-27 DIAGNOSIS — I4891 Unspecified atrial fibrillation: Secondary | ICD-10-CM | POA: Insufficient documentation

## 2022-03-27 DIAGNOSIS — W19XXXA Unspecified fall, initial encounter: Secondary | ICD-10-CM

## 2022-03-27 DIAGNOSIS — Z7982 Long term (current) use of aspirin: Secondary | ICD-10-CM | POA: Insufficient documentation

## 2022-03-27 DIAGNOSIS — S0990XA Unspecified injury of head, initial encounter: Secondary | ICD-10-CM | POA: Diagnosis present

## 2022-03-27 DIAGNOSIS — Z91040 Latex allergy status: Secondary | ICD-10-CM | POA: Insufficient documentation

## 2022-03-27 LAB — CBC WITH DIFFERENTIAL/PLATELET
Abs Immature Granulocytes: 0.03 10*3/uL (ref 0.00–0.07)
Basophils Absolute: 0 10*3/uL (ref 0.0–0.1)
Basophils Relative: 0 %
Eosinophils Absolute: 0.1 10*3/uL (ref 0.0–0.5)
Eosinophils Relative: 1 %
HCT: 42.3 % (ref 39.0–52.0)
Hemoglobin: 13.8 g/dL (ref 13.0–17.0)
Immature Granulocytes: 0 %
Lymphocytes Relative: 26 %
Lymphs Abs: 1.7 10*3/uL (ref 0.7–4.0)
MCH: 29 pg (ref 26.0–34.0)
MCHC: 32.6 g/dL (ref 30.0–36.0)
MCV: 88.9 fL (ref 80.0–100.0)
Monocytes Absolute: 0.6 10*3/uL (ref 0.1–1.0)
Monocytes Relative: 9 %
Neutro Abs: 4.2 10*3/uL (ref 1.7–7.7)
Neutrophils Relative %: 64 %
Platelets: 131 10*3/uL — ABNORMAL LOW (ref 150–400)
RBC: 4.76 MIL/uL (ref 4.22–5.81)
RDW: 13.3 % (ref 11.5–15.5)
WBC: 6.7 10*3/uL (ref 4.0–10.5)
nRBC: 0 % (ref 0.0–0.2)

## 2022-03-27 LAB — BASIC METABOLIC PANEL
Anion gap: 6 (ref 5–15)
BUN: 14 mg/dL (ref 6–20)
CO2: 28 mmol/L (ref 22–32)
Calcium: 8.9 mg/dL (ref 8.9–10.3)
Chloride: 102 mmol/L (ref 98–111)
Creatinine, Ser: 0.99 mg/dL (ref 0.61–1.24)
GFR, Estimated: >60 mL/min (ref >60)
Glucose, Bld: 78 mg/dL (ref 70–99)
Potassium: 4.8 mmol/L (ref 3.5–5.1)
Sodium: 136 mmol/L (ref 135–145)

## 2022-03-27 LAB — VALPROIC ACID LEVEL: Valproic Acid Lvl: 48 ug/mL — ABNORMAL LOW (ref 50.0–100.0)

## 2022-03-27 MED ORDER — CLONAZEPAM 0.5 MG PO TABS
0.5000 mg | ORAL_TABLET | Freq: Once | ORAL | Status: AC
  Administered 2022-03-27: 0.5 mg via ORAL
  Filled 2022-03-27: qty 1

## 2022-03-27 NOTE — ED Triage Notes (Signed)
Patient BIB [facility] from [facility]. Patient states he his head and right shoulder. Patient states he normally uses a wheelchair and does not walk unless someone is with him. Patient denies being on blood thinners.  ?

## 2022-03-27 NOTE — Discharge Instructions (Addendum)
You have been seen and discharged from the emergency department.  The CT scanning of your head and neck were unremarkable.  Your blood work was baseline.  Follow-up with your primary provider for further evaluation and further care. Take home medications as prescribed. If you have any worsening symptoms or further concerns for your health please return to an emergency department for further evaluation. ?

## 2022-03-27 NOTE — ED Provider Notes (Signed)
?[facility]-EMERGENCY DEPT ?Provider Note ? ? ?CSN: [id number] ?Arrival date & time: 03/27/22  1313 ? ?  ? ?History ? ?No chief complaint on file. ? ? ?[name] is a 56 y.o. male. ? ?HPI ? ?  ? ?56 year old comes in with chief complaint of fall. ?Level 5 caveat for anoxic brain injury. ? ?Patient has history of A-fib, dementia, hyperlipidemia and is primarily wheelchair bound, resides at a nursing home facility. ? ?Spoke with nursing home at [facility].  They indicate that patient was found on the floor.  Unwitnessed episode.  Patient is not on any blood thinners. ? ?Patient indicates that he is having pain over his shoulder area and head.  He denies any numbness or tingling ? ?Home Medications ?Prior to Admission medications   ?Medication Sig Start Date End Date Taking? Authorizing Provider  ?acetaminophen (TYLENOL) 325 MG tablet Take 650 mg by mouth every 4 (four) hours as needed for mild pain.    [provider]  ?albuterol (ACCUNEB) 0.63 MG/3ML nebulizer solution Take 1 ampule by nebulization every 6 (six) hours as needed for wheezing or shortness of breath (COPD).    [provider]  ?aspirin 81 MG chewable tablet Chew 81 mg by mouth daily.    [provider]  ?Carboxymeth-Glycerin-Polysorb (REFRESH OPTIVE ADVANCED OP) Place 1 drop into both eyes in the morning, at noon, and at bedtime.    [provider]  ?clonazePAM (KLONOPIN) 0.5 MG tablet Take 0.5-1 mg by mouth See admin instructions. Take one tablet by mouth in the morning, then take 2 tablets by mouth at bedtime    [provider]  ?divalproex (DEPAKOTE SPRINKLE) 125 MG capsule Take 500 mg by mouth 2 (two) times daily.  07/18/13   [provider]  ?haloperidol (HALDOL) 2 MG/ML solution Take 2 mg by mouth 2 (two) times daily.    [provider]  ?levothyroxine (SYNTHROID) 200 MCG tablet Take 200 mcg by mouth daily before breakfast.    [provider]   ?levothyroxine (SYNTHROID) 25 MCG tablet Take 25 mcg by mouth daily before breakfast. Take with tablet every day    [provider]  ?pantoprazole sodium (PROTONIX) 40 mg/20 mL PACK Take 40 mg by mouth daily at 12 noon. 11/30/11   [name], MD  ?polyethylene glycol (MIRALAX / GLYCOLAX) packet Take 17 g by mouth 2 (two) times daily.    [provider]  ?senna (SENOKOT) 8.6 MG tablet Take 2 tablets by mouth 2 (two) times daily.    [provider]  ?sertraline (ZOLOFT) 50 MG tablet Take 75 mg by mouth daily.    [provider]  ?traMADol (ULTRAM) 50 MG tablet Take 50 mg by mouth every 6 (six) hours as needed for moderate pain.    [provider]  ?traZODone (DESYREL) 50 MG tablet Take 50 mg by mouth at bedtime.    [provider]  ?   ? ?Allergies    ?Ativan [lorazepam], Latex, Morphine and related, Penicillins, Pyridium [phenazopyridine hcl], and Soy allergy   ? ?Review of Systems   ?Review of Systems ? ?Physical Exam ?Updated Vital Signs ?BP 118/79 (BP Location: Left Arm)   Pulse 89   Temp 97.8 ?F (36.6 ?C) (Oral)   Resp 20   Ht 5\' 9"  (1.753 m)   Wt 82 kg   SpO2 97%   BMI 26.70 kg/m?  ?Physical Exam ?Vitals and nursing note reviewed.  ?Constitutional:   ?  Appearance: He is well-developed.  ?HENT:  ?   Head: Atraumatic.  ?Neck:  ?   Comments: Midline C-spine tenderness over the lower spine ?Cardiovascular:  ?   Rate and Rhythm: Normal rate.  ?Pulmonary:  ?   Effort: Pulmonary effort is normal.  ?Musculoskeletal:  ?   Cervical back: Neck supple.  ?   Comments: Head to toe evaluation shows no hematoma, bleeding of the scalp, no facial abrasions, no spine step offs, crepitus of the chest or neck, no tenderness to palpation of the bilateral upper and lower extremities, no gross deformities, no chest tenderness, no pelvic pain. ?  ?Skin: ?   General: Skin is warm.  ?Neurological:  ?   Mental Status: He is alert and oriented to person, place, and  time.  ? ? ?ED Results / Procedures / Treatments   ?Labs ?(all labs ordered are listed, but only abnormal results are displayed) ?Labs Reviewed  ?VALPROIC ACID LEVEL - Abnormal; Notable for the following components:  ?    Result Value  ? Valproic Acid Lvl 48 (*)   ? All other components within normal limits  ?CBC WITH DIFFERENTIAL/PLATELET - Abnormal; Notable for the following components:  ? Platelets 131 (*)   ? All other components within normal limits  ?BASIC METABOLIC PANEL  ? ? ?EKG ?None ? ?Radiology ?CT Head Wo Contrast ? ?Result Date: 03/27/2022 ?CLINICAL DATA:  Blunt trauma EXAM: CT HEAD WITHOUT CONTRAST TECHNIQUE: Contiguous axial images were obtained from the base of the skull through the vertex without intravenous contrast. RADIATION DOSE REDUCTION: This exam was performed according to the departmental dose-optimization program which includes automated exposure control, adjustment of the mA and/or kV according to patient size and/or use of iterative reconstruction technique. COMPARISON:  None. FINDINGS: Brain: No acute intracranial hemorrhage. No focal mass lesion. No CT evidence of acute infarction. No midline shift or mass effect. No hydrocephalus. Basilar cisterns are patent. Vascular: No hyperdense vessel or unexpected calcification. Skull: Normal. Negative for fracture or focal lesion. Sinuses/Orbits: Paranasal sinuses and mastoid air cells are clear. Orbits are clear. Other: Small posterior RIGHT scalp hematoma (image 23/1) measures 8 mm in depth. IMPRESSION: 1. Small scalp hematoma. 2. No skull fracture. 3. No intracranial trauma. Electronically Signed   By: Genevive BiStewart  Edmunds M.D.   On: 03/27/2022 15:01  ? ?CT Cervical Spine Wo Contrast ? ?Result Date: 03/27/2022 ?CLINICAL DATA:  Trauma EXAM: CT CERVICAL SPINE WITHOUT CONTRAST TECHNIQUE: Multidetector CT imaging of the cervical spine was performed without intravenous contrast. Multiplanar CT image reconstructions were also generated. RADIATION DOSE  REDUCTION: This exam was performed according to the departmental dose-optimization program which includes automated exposure control, adjustment of the mA and/or kV according to patient size and/or use of iterative reconstruction technique. COMPARISON:  Cervical spine CT 02/15/2022 FINDINGS: Alignment: There is straightening of the normal cervical spine lordosis. There is no antero or retrolisthesis. There is no jumped or perched facets or other evidence of traumatic malalignment. Skull base and vertebrae: Skull base alignment is maintained. Vertebral body heights are preserved. There is no evidence of acute fracture. There is no suspicious osseous lesion. Soft tissues and spinal canal: No prevertebral fluid or swelling. No visible canal hematoma. Disc levels: There is multilevel intervertebral disc space narrowing with associated degenerative endplate change and facet arthropathy, unchanged since the recent study from 02/15/2022. Upper chest: The imaged portions of the right lung apex are clear. The left lung apex is not imaged. Other: None. IMPRESSION: No acute fracture  or traumatic malalignment of the cervical spine. Electronically Signed   By: Lesia Hausen M.D.   On: 03/27/2022 15:06   ? ?Procedures ?Procedures  ? ? ?Medications Ordered in ED ?Medications  ?clonazePAM (KLONOPIN) tablet 0.5 mg (0.5 mg Oral Given 03/27/22 1706)  ? ? ?ED Course/ Medical Decision Making/ A&P ?  ?                        ?Medical Decision Making ?Amount and/or Complexity of Data Reviewed ?Labs: ordered. ?Radiology: ordered. ? ?Risk ?Prescription drug management. ? ?This patient presents to the ED with chief complaint(s) of fall and shoulder pain with pertinent past medical history of anoxic brain injury, A-fib, it appears that patient is not on any blood thinners based on medication review. ? ?The differential diagnosis includes: ?DDx includes: ?- Mechanical falls ?- ICH ?- Fractures ?- Contusions ?- Soft tissue injury ? ?On exam,  patient has no gross deformity anywhere.  He is having C-spine tenderness and is not the best historian, with unwitnessed fall ? ?I do not think chest x-ray is indicated.  Patient has no hypoxia, respiratory distress and de

## 2022-03-27 NOTE — ED Notes (Signed)
Pt is crying and trying to get out of the bed ?

## 2022-03-27 NOTE — ED Notes (Signed)
Pt placed in posey belt due to pt repeatedly trying to get out of the bed and recliner.  ?

## 2022-03-27 NOTE — ED Notes (Signed)
[facility] called for transport needs  

## 2022-03-27 NOTE — ED Provider Notes (Signed)
Patient signed out to me by previous provider. Please refer to their note for full HPI.  Briefly this is a 56 year old male who is wheelchair-bound that was found on the ground, assumed mechanical fall.  Resides at [facility].  Signs of head trauma.  Arrived in c-collar.  Pending CT imaging. ?Physical Exam  ?BP (!) 124/91 (BP Location: Right Arm)   Pulse (!) 54   Temp 97.8 ?F (36.6 ?C) (Oral)   Resp 18   Ht 5\' 9"  (1.753 m)   Wt 82 kg   SpO2 100%   BMI 26.70 kg/m?  ? ?Physical Exam ?Vitals and nursing note reviewed.  ?Constitutional:   ?   General: He is not in acute distress. ?   Appearance: Normal appearance.  ?HENT:  ?   Head: Normocephalic.  ?   Comments: Frontal bruising ?   Mouth/Throat:  ?   Mouth: Mucous membranes are dry.  ?Neck:  ?   Comments: C-collar in place ?Cardiovascular:  ?   Rate and Rhythm: Normal rate.  ?Pulmonary:  ?   Effort: Pulmonary effort is normal. No respiratory distress.  ?Abdominal:  ?   Palpations: Abdomen is soft.  ?   Tenderness: There is no abdominal tenderness.  ?Neurological:  ?   General: No focal deficit present.  ?   Mental Status: He is alert. Mental status is at baseline.  ? ? ?Procedures  ?Procedures ? ?ED Course / MDM  ?  ?Medical Decision Making ?Amount and/or Complexity of Data Reviewed ?Labs: ordered. ?Radiology: ordered. ? ?Risk ?Prescription drug management. ? ? ?CT imaging of the head and neck showed no acute findings.  Blood work is baseline.  No findings of acute illness/process.  Patient appears to be baseline.  Plan for discharge back to facility. ? ? ? ? ?  ? , DO ?03/27/22 1721 ? ?

## 2022-04-28 ENCOUNTER — Emergency Department (HOSPITAL_COMMUNITY): Payer: Medicare Other

## 2022-04-28 ENCOUNTER — Emergency Department (HOSPITAL_COMMUNITY)
Admission: EM | Admit: 2022-04-28 | Discharge: 2022-04-29 | Disposition: A | Discharge status: Skilled Nursing Facility | Payer: Medicare Other | Attending: Emergency Medicine | Admitting: Emergency Medicine

## 2022-04-28 DIAGNOSIS — S0993XA Unspecified injury of face, initial encounter: Secondary | ICD-10-CM | POA: Diagnosis present

## 2022-04-28 DIAGNOSIS — S0990XA Unspecified injury of head, initial encounter: Secondary | ICD-10-CM

## 2022-04-28 DIAGNOSIS — S0083XA Contusion of other part of head, initial encounter: Secondary | ICD-10-CM | POA: Diagnosis not present

## 2022-04-28 DIAGNOSIS — Z91040 Latex allergy status: Secondary | ICD-10-CM | POA: Diagnosis not present

## 2022-04-28 DIAGNOSIS — Y9301 Activity, walking, marching and hiking: Secondary | ICD-10-CM | POA: Diagnosis not present

## 2022-04-28 DIAGNOSIS — S50312A Abrasion of left elbow, initial encounter: Secondary | ICD-10-CM | POA: Diagnosis not present

## 2022-04-28 DIAGNOSIS — Z7982 Long term (current) use of aspirin: Secondary | ICD-10-CM | POA: Insufficient documentation

## 2022-04-28 DIAGNOSIS — W1830XA Fall on same level, unspecified, initial encounter: Secondary | ICD-10-CM | POA: Diagnosis not present

## 2022-04-28 DIAGNOSIS — W19XXXA Unspecified fall, initial encounter: Secondary | ICD-10-CM

## 2022-04-28 NOTE — ED Triage Notes (Signed)
From [facility]. Unwitnessed fall while walking. Hx of TBI on 2011. Hematoma and abrasions to face.  ?

## 2022-04-28 NOTE — Discharge Instructions (Signed)
Return for any problem.  ?

## 2022-04-28 NOTE — ED Notes (Signed)
Report given to [facility] from [facility]. Pt is going home by [facility]. Cleared for discharge by MD.  ?

## 2022-04-28 NOTE — ED Provider Notes (Signed)
?[facility] ?Provider Note ? ? ?CSN: [id number] ?Arrival date & time: 04/28/22  1429 ? ?  ? ?History ? ?Chief Complaint  ?Patient presents with  ? Fall  ? ? ?[name] is a 56 y.o. male. ? ?56 year old male with prior medical history as detailed below presents for evaluation.  [facility]  Patient with prior history of TBI.  Patient with superficial abrasion to the left cheek and also to the left elbow. ? ?Patient appears to be at baseline mental status. ? ?Patient is otherwise without complaint. ? ?Patient with longstanding history of frequent falls. ? ?The history is provided by the patient and the EMS personnel.  ?Fall ?This is a new problem. The current episode started less than 1 hour ago. The problem occurs every several days. The problem has not changed since onset.Pertinent negatives include no chest pain, no abdominal pain, no headaches and no shortness of breath. Nothing aggravates the symptoms. Nothing relieves the symptoms.  ? ?  ? ?Home Medications ?Prior to Admission medications   ?Medication Sig Start Date End Date Taking? Authorizing Provider  ?acetaminophen (TYLENOL) 650 MG CR tablet Take 650 mg by mouth every 4 (four) hours as needed for pain.   Yes [provider]  ?aspirin 81 MG chewable tablet Chew 81 mg by mouth daily.   Yes [provider]  ?clonazePAM (KLONOPIN) 0.5 MG tablet Take 0.5 mg by mouth 2 (two) times daily.   Yes [provider]  ?divalproex (DEPAKOTE SPRINKLE) 125 MG capsule Take 500 mg by mouth 2 (two) times daily.  07/18/13  Yes [provider]  ?haloperidol (HALDOL) 2 MG/ML solution Take 2 mg by mouth 2 (two) times daily.   Yes [provider]  ?levothyroxine (SYNTHROID) 200 MCG tablet Take 200 mcg by mouth daily before breakfast.   Yes [provider]  ?metoprolol tartrate (LOPRESSOR) 25 MG tablet Take 12.5 mg by mouth daily.   Yes [provider]  ?Multiple Vitamin (MULTIVITAMIN WITH MINERALS) TABS tablet Take 1 tablet by mouth daily.   Yes [provider]  ?pantoprazole sodium (PROTONIX) 40 mg/20 mL PACK Take 40 mg by mouth daily. 11/30/11  Yes Alinda Moneyavis, Novlet J, MD  ?polyethylene glycol (MIRALAX / GLYCOLAX) packet Take 17 g by mouth 2 (two) times daily.   Yes [provider]  ?polyvinyl alcohol (ARTIFICIAL TEARS) 1.4 % ophthalmic solution Place 1 drop into both eyes as needed for dry eyes.   Yes [provider]  ?senna (SENOKOT) 8.6 MG tablet Take 1 tablet by mouth 2 (two) times daily.   Yes [provider]  ?sertraline (ZOLOFT) 50 MG tablet Take 50 mg by mouth daily.   Yes [provider]  ?traMADol (ULTRAM) 50 MG tablet Take 50 mg by mouth every 6 (six) hours as needed for moderate pain.   Yes [provider]  ?traZODone (DESYREL) 50 MG tablet Take 50 mg by mouth at bedtime.   Yes [provider]  ?acetaminophen (TYLENOL) 325 MG tablet Take 650 mg by mouth every 4 (four) hours as needed for mild pain.    [provider]  ?albuterol (ACCUNEB) 0.63 MG/3ML nebulizer solution Take 1 ampule by nebulization every 6 (six) hours as needed for wheezing or shortness of breath (COPD).    [provider]  ?Carboxymeth-Glycerin-Polysorb (REFRESH OPTIVE ADVANCED OP) Place 1 drop into both eyes in the morning, at noon, and at bedtime.    [provider]  ?levothyroxine (SYNTHROID) 25 MCG tablet Take 25 mcg by mouth daily before breakfast. Take with tablet every day    [provider]  ?   ? ?Allergies    ?Ativan [lorazepam], Latex, Morphine and related, Penicillins, Pyridium [phenazopyridine hcl], and Soy allergy   ? ?Review of Systems   ?Review of Systems  ?Respiratory:  Negative for shortness of breath.   ?Cardiovascular:  Negative for chest pain.  ?Gastrointestinal:  Negative for abdominal pain.  ?Neurological:  Negative for headaches.  ?All other  systems reviewed and are negative. ? ?Physical Exam ?Updated Vital Signs ?BP 92/68 (BP Location: Right Arm)   Pulse 69   Temp 97.8 ?F (36.6 ?C) (Oral)   Resp 15   SpO2 97%  ?Physical Exam ?Vitals and nursing note reviewed.  ?Constitutional:   ?   General: He is not in acute distress. ?   Appearance: Normal appearance. He is well-developed.  ?HENT:  ?   Head: Normocephalic.  ?   Comments: Superficial abrasion and contusion noted to the left cheek. ?Eyes:  ?   Conjunctiva/sclera: Conjunctivae normal.  ?   Pupils: Pupils are equal, round, and reactive to light.  ?Cardiovascular:  ?   Rate and Rhythm: Normal rate and regular rhythm.  ?   Heart sounds: Normal heart sounds.  ?Pulmonary:  ?   Effort: Pulmonary effort is normal. No respiratory distress.  ?   Breath sounds: Normal breath sounds.  ?Abdominal:  ?   General: There is no distension.  ?   Palpations: Abdomen is soft.  ?   Tenderness: There is no abdominal tenderness.  ?Musculoskeletal:     ?   General: No swelling, tenderness or deformity. Normal range of motion.  ?   Cervical back: Normal range of motion and neck supple.  ?Skin: ?   General: Skin is warm and dry.  ?   Comments: Superficial abrasion noted to the lateral aspect of the left elbow.  ?Neurological:  ?   General: No focal deficit present.  ?   Mental Status: He is alert and oriented to person, place, and time.  ? ? ?ED Results / Procedures / Treatments   ?Labs ?(all labs ordered are listed, but only abnormal results are displayed) ?Labs Reviewed - No data to display ? ?EKG ?None ? ?Radiology ?No results found. ? ?Procedures ?Procedures  ? ? ?Medications Ordered in ED ?Medications - No data to display ? ?ED Course/ Medical Decision Making/ A&P ?  ?                        ?Medical Decision Making ?Amount and/or Complexity of Data Reviewed ?Radiology: ordered. ? ? ? ?Medical Screen Complete ? ?This patient presented to the ED with complaint of fall, head injury. ? ?This complaint involves an  extensive number of treatment options. The initial differential diagnosis includes, but is not limited to, traumatic injury related to fall ? ?This presentation is: Acute, Chronic, Self-Limited, Previously Undiagnosed, Uncertain Prognosis, Complicated, Systemic Symptoms, and Threat to Life/Bodily Function ? ?Patient with prior medical history significant for history of TBI presents after fall from standing position. ? ?Patient did strike his head. ? ?Patient without evidence of significant traumatic injury on evaluation. ? ?Co morbidities that complicated the patient's evaluation ? ?History of TBI, poor historian ? ? ?Additional history obtained: ? ?Additional history obtained from EMS ?External records from outside sources obtained and reviewed including prior ED visits and prior  Inpatient records.  ? ?Imaging Studies ordered: ? ?I ordered imaging studies including CT maxillofacial, CT cervical spine, CT head, plain films of left elbow ?I independently visualized and interpreted obtained imaging which showed NAD ?I agree with the radiologist interpretation. ? ? ?Cardiac Monitoring: ? ?The patient was maintained on a cardiac monitor.  I personally viewed and interpreted the cardiac monitor which showed an underlying rhythm of: NSR ? ? ?Problem List / ED Course: ? ?Fall, head injury ? ? ?Reevaluation: ? ?After the interventions noted above, I reevaluated the patient and found that they have: improved ? ?Disposition: ? ?After consideration of the diagnostic results and the patients response to treatment, I feel that the patent would benefit from close outpatient follow-up ? ? ? ? ? ? ? ? ?Final Clinical Impression(s) / ED Diagnoses ?Final diagnoses:  ?Fall, initial encounter  ?Injury of head, initial encounter  ? ? ?Rx / DC Orders ?ED Discharge Orders   ? ? None  ? ?  ? ? ?  ?Wynetta Fines, MD ?04/28/22 1639 ? ?

## 2022-04-29 IMAGING — CT CT HEAD W/O CM
3 series · 16 of 47 positions shown, 19 images · non-contrast
Comparison: None.

CLINICAL DATA: Blunt trauma



[Series 1: head wo · axial · 0.45mm/px · z∈[-124,+26]mm · 10 of 36 slices shown, 13 images]
[im 3/36  brain]
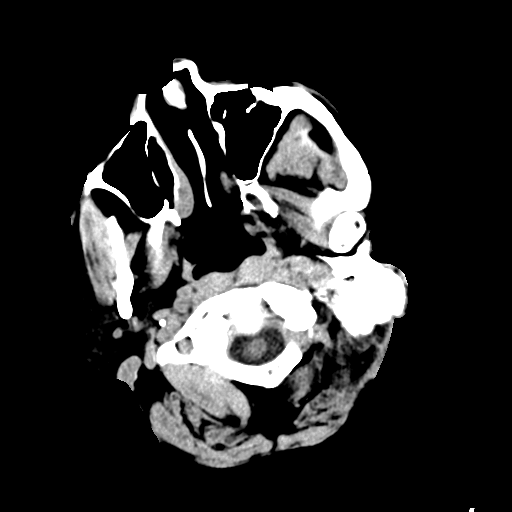
[im 3/36  bone]
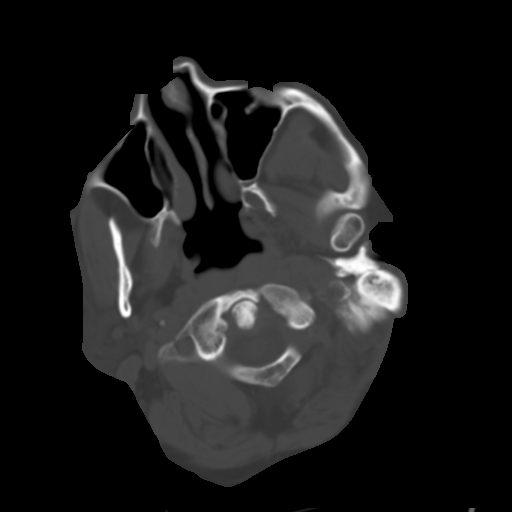
[im 7/36  brain]
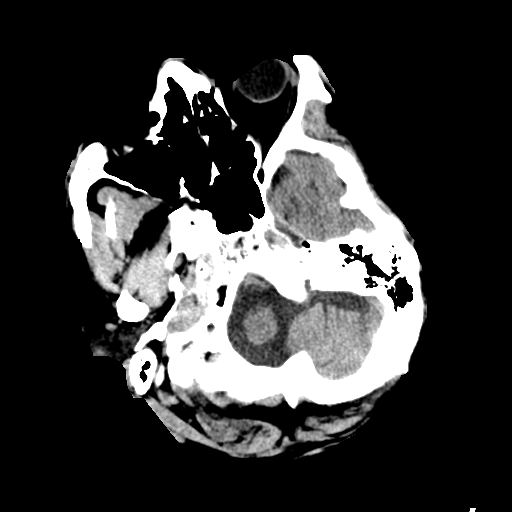
[im 10/36  brain]
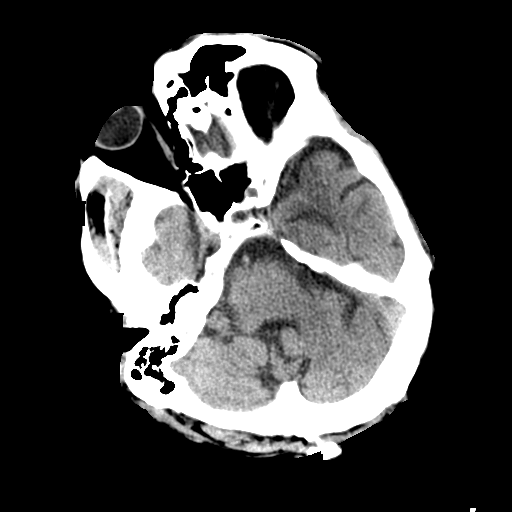
[im 13/36  brain]
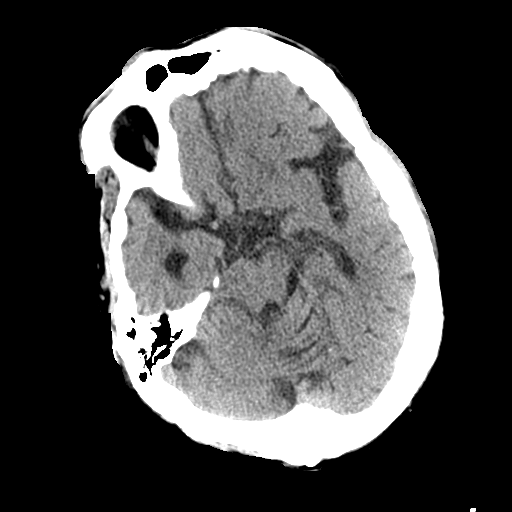
[im 16/36  brain]
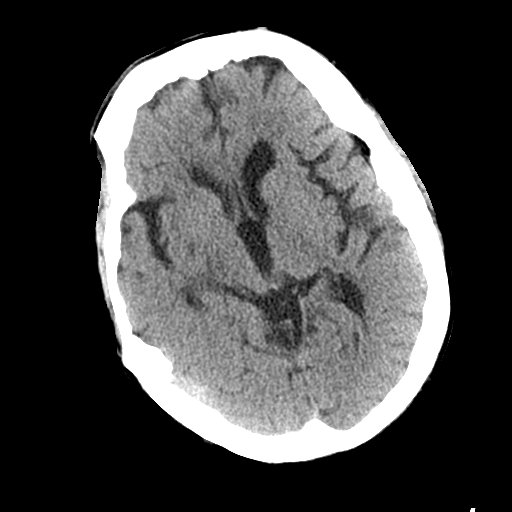
[im 16/36  bone]
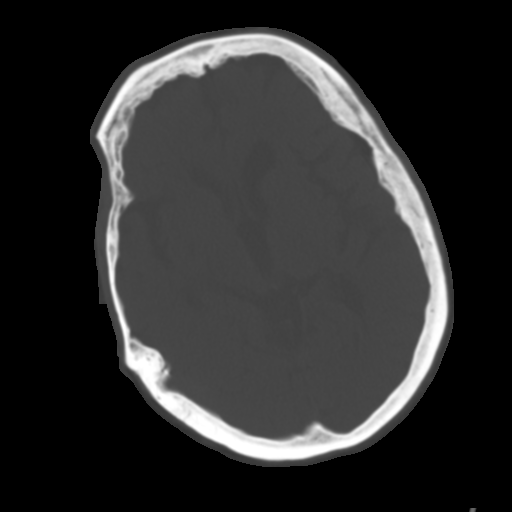
[im 20/36  brain]
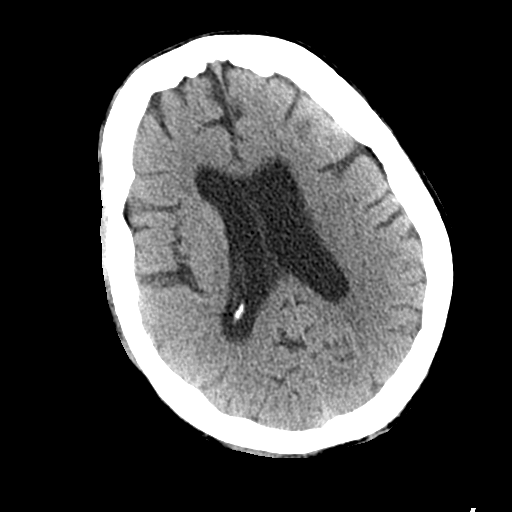
[im 23/36  brain]
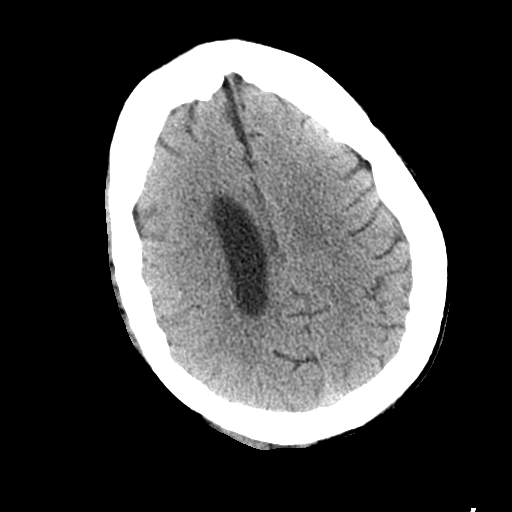
[im 27/36  brain]
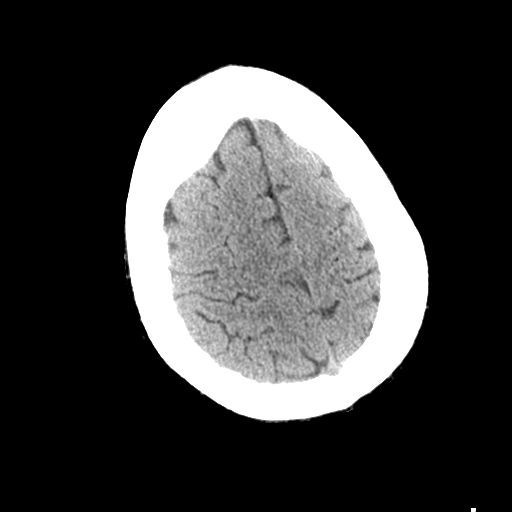
[im 29/36  brain]
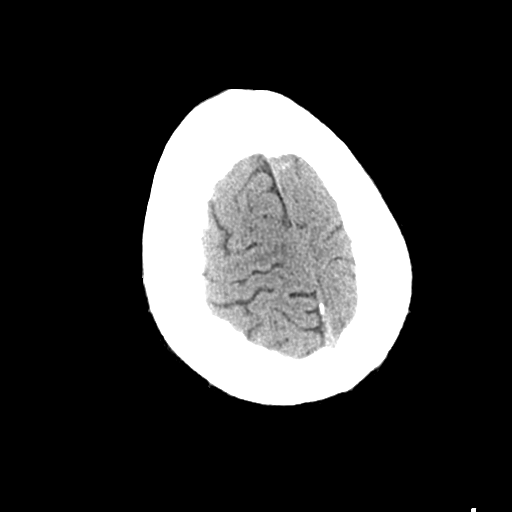
[im 29/36  bone]
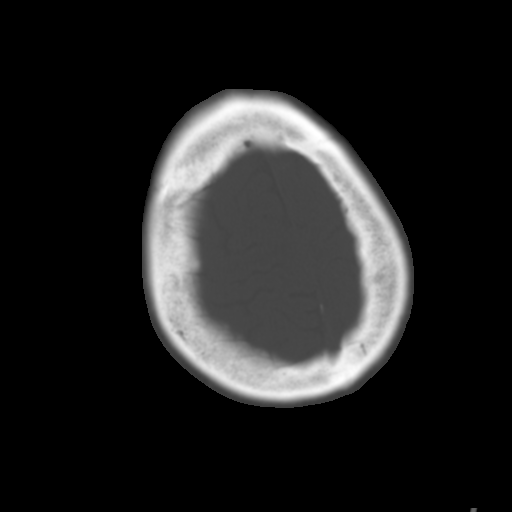
[im 33/36  brain]
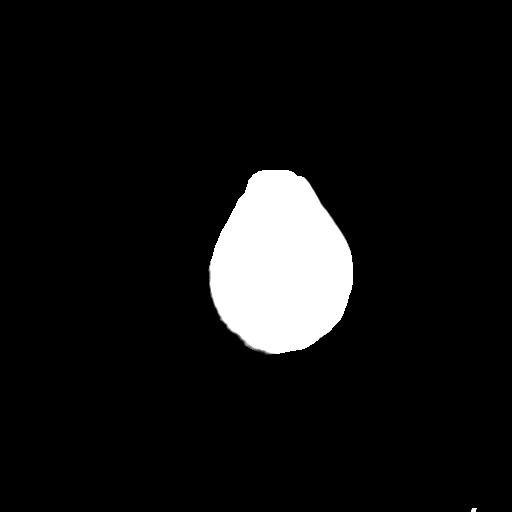

[Series 5: coronal soft tissue · coronal · 0.38mm/px · 3 of 73 slices shown]
[im 25/73  brain]
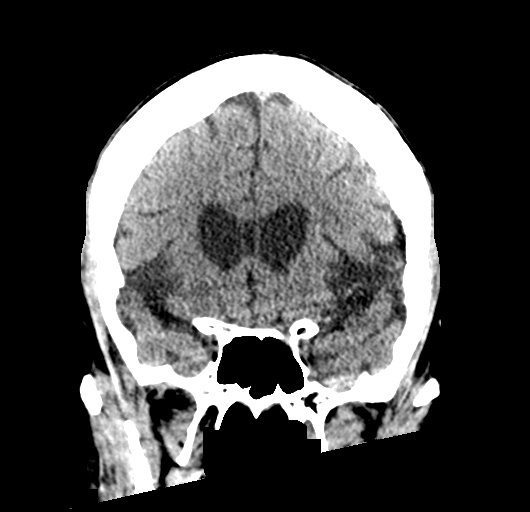
[im 33/73  brain]
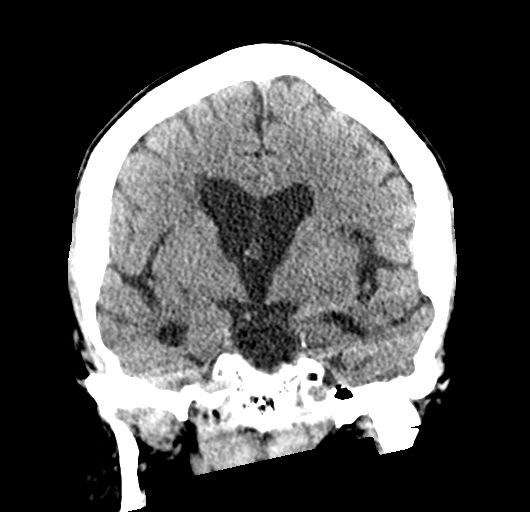
[im 41/73  brain]
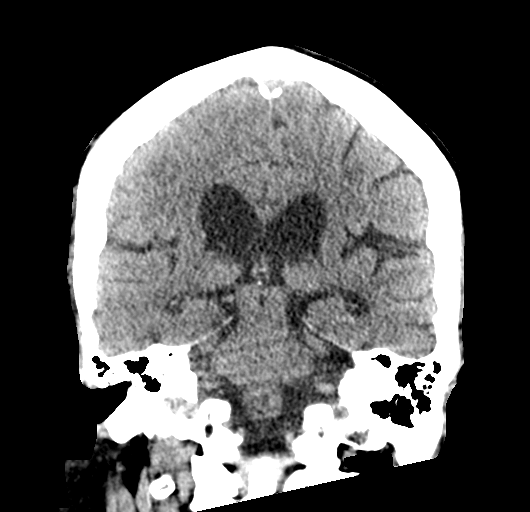

[Series 6: sagittal soft tissue · sagittal · 0.38mm/px · 3 of 63 slices shown]
[im 29/63  brain]
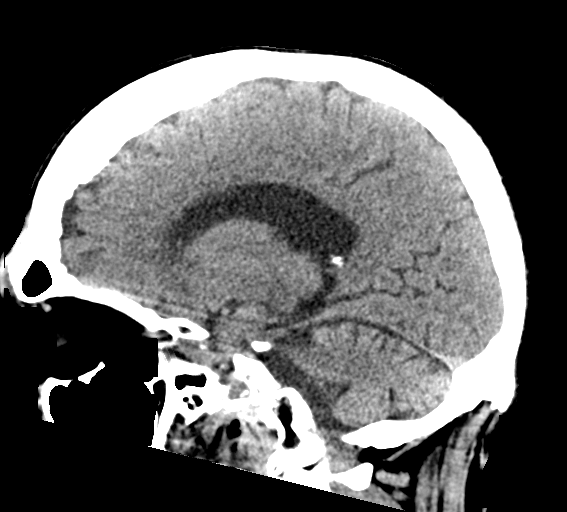
[im 34/63  brain]
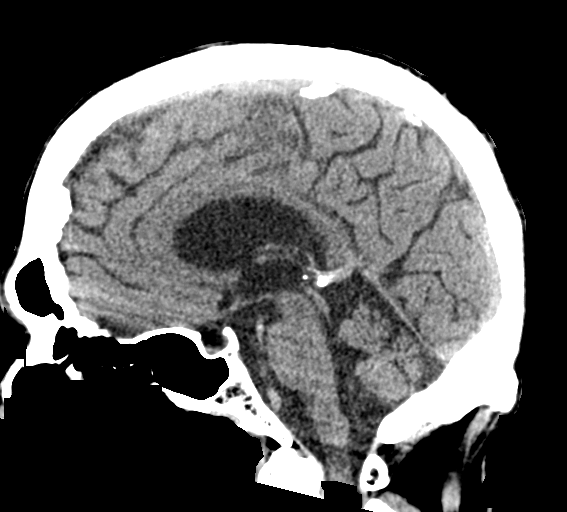
[im 39/63  brain]
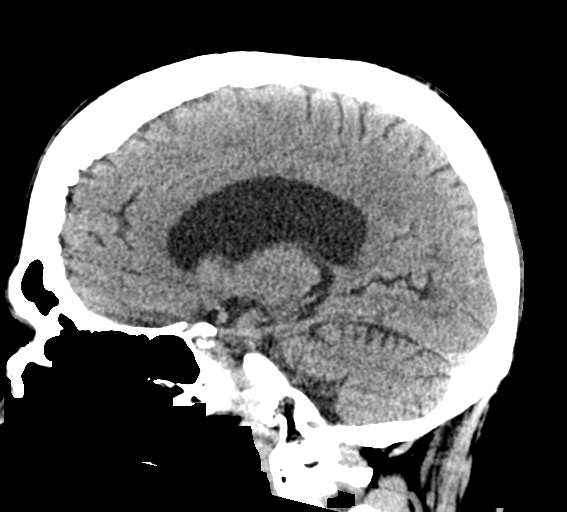

[16 of 47 positions shown; findings below may reference images not displayed]

FINDINGS: Brain: No acute intracranial hemorrhage. No focal mass lesion. No CT
evidence of acute infarction. No midline shift or mass effect. No
hydrocephalus. Basilar cisterns are patent.

Vascular: No hyperdense vessel or unexpected calcification.

Skull: Normal. Negative for fracture or focal lesion.

Sinuses/Orbits: Paranasal sinuses and mastoid air cells are clear.
Orbits are clear.

Other: Small posterior RIGHT scalp hematoma (image [DATE]) measures 8
mm in depth.
IMPRESSION: 1. Small scalp hematoma.
2. No skull fracture.
3. No intracranial trauma.

## 2022-04-29 IMAGING — CT CT CERVICAL SPINE W/O CM
3 of 4 series · 13 of 35 positions shown, 16 images · non-contrast
Comparison: Cervical spine CT 02/15/2022

CLINICAL DATA: Trauma



[Series 5: orthogonal bone · axial · 0.23mm/px · z∈[-238,-135]mm · 5 of 86 slices shown, 7 images]
[im 15/86  soft-tissue]
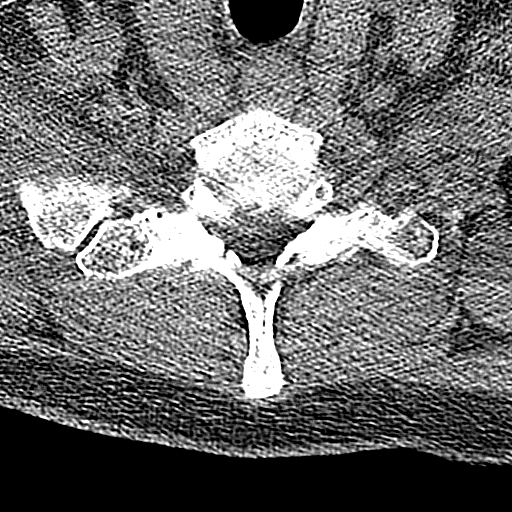
[im 15/86  bone]
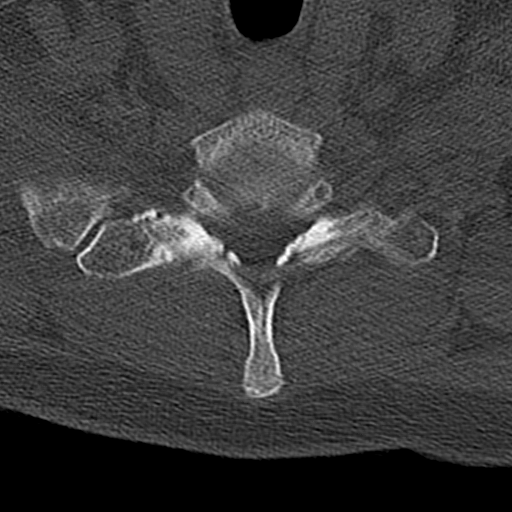
[im 29/86  bone]
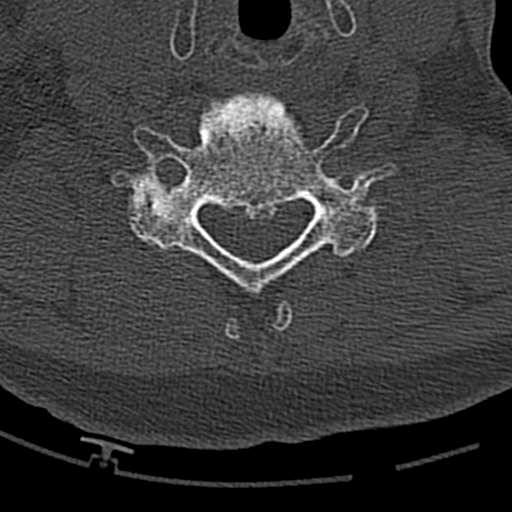
[im 43/86  bone]
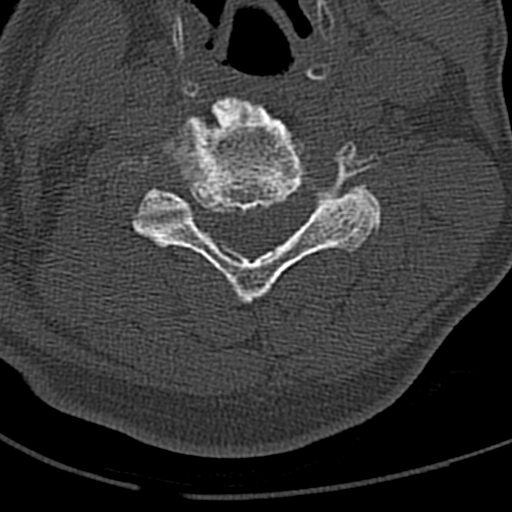
[im 57/86  bone]
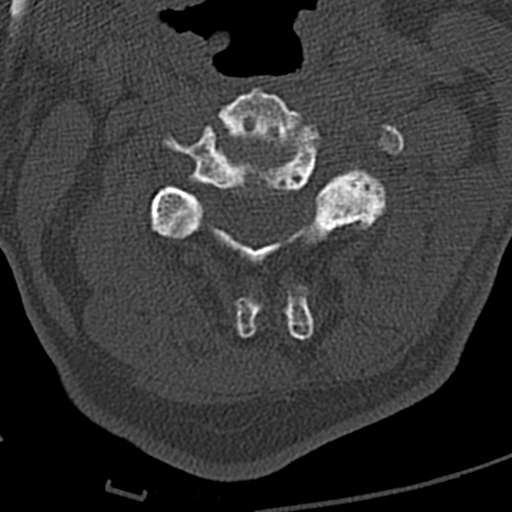
[im 71/86  soft-tissue]
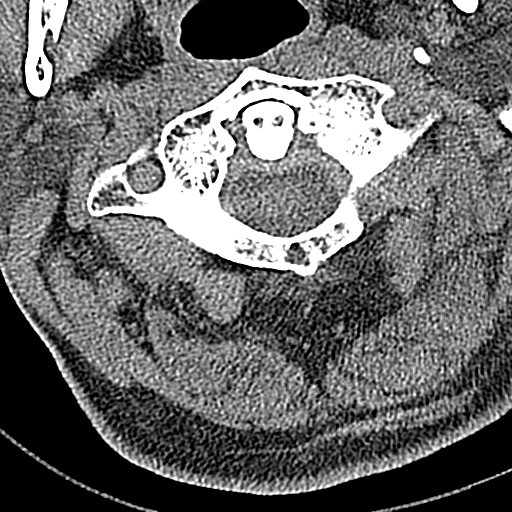
[im 71/86  bone]
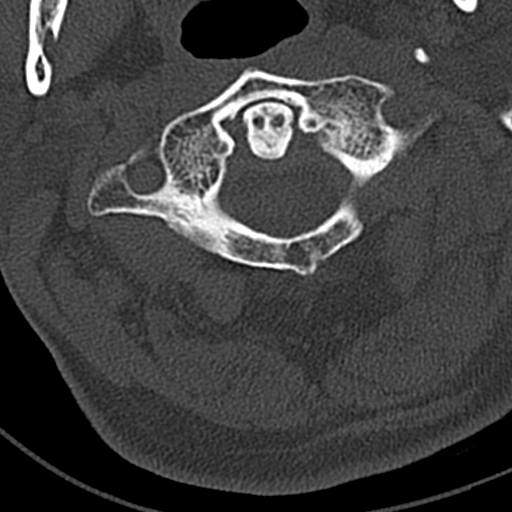

[Series 6: coronal bone · coronal · 0.23mm/px · 3 of 61 slices shown]
[im 13/61  bone]
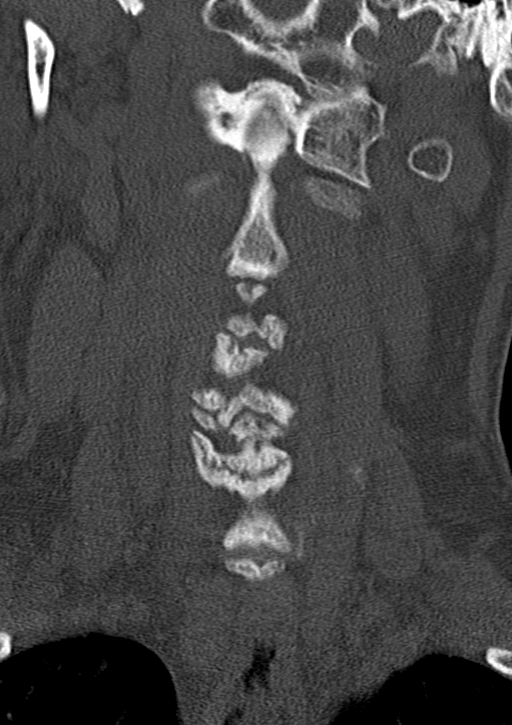
[im 25/61  bone]
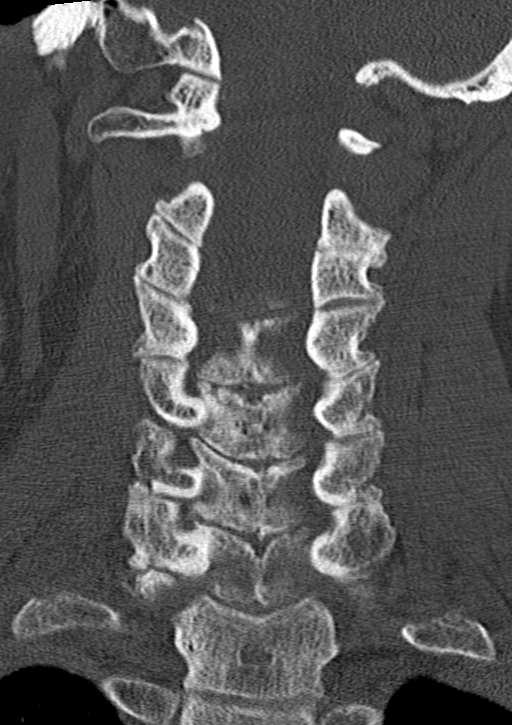
[im 37/61  bone]
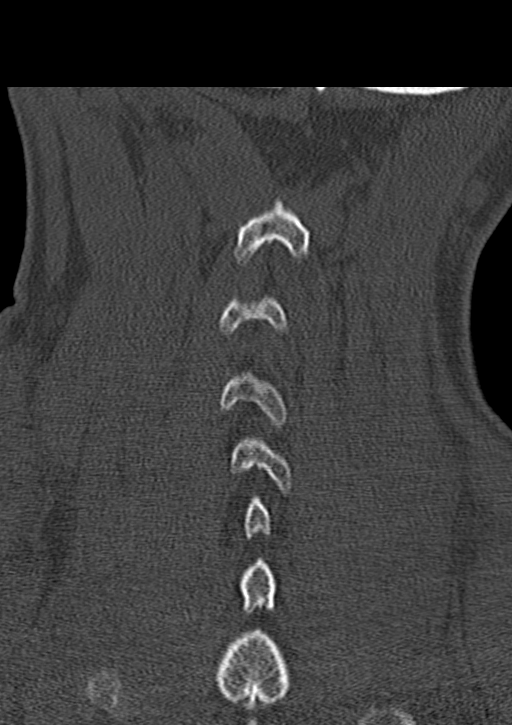

[Series 7: sagittal bone · sagittal · 0.23mm/px · 5 of 61 slices shown, 6 images]
[im 21/61  bone]
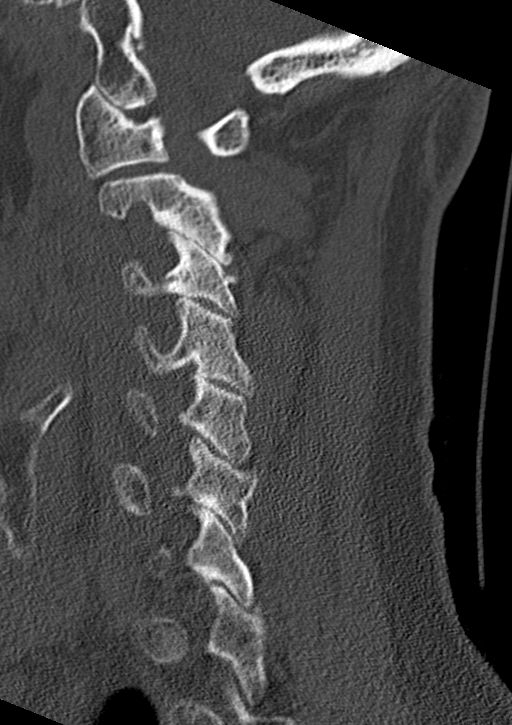
[im 26/61  bone]
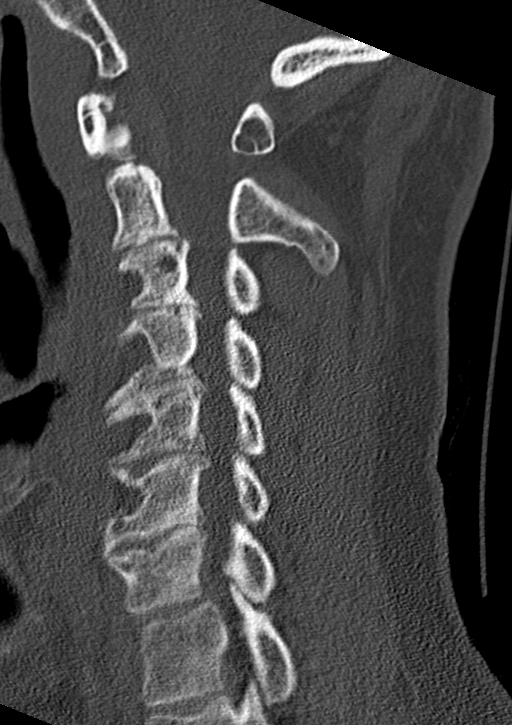
[im 31/61  soft-tissue]
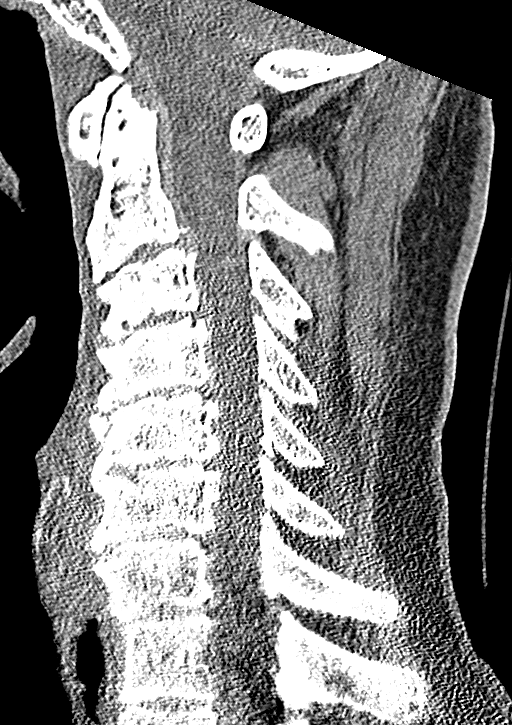
[im 31/61  bone]
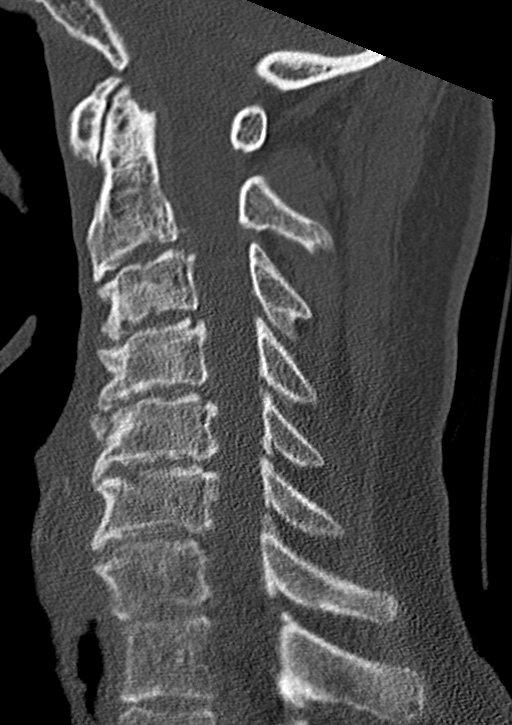
[im 36/61  bone]
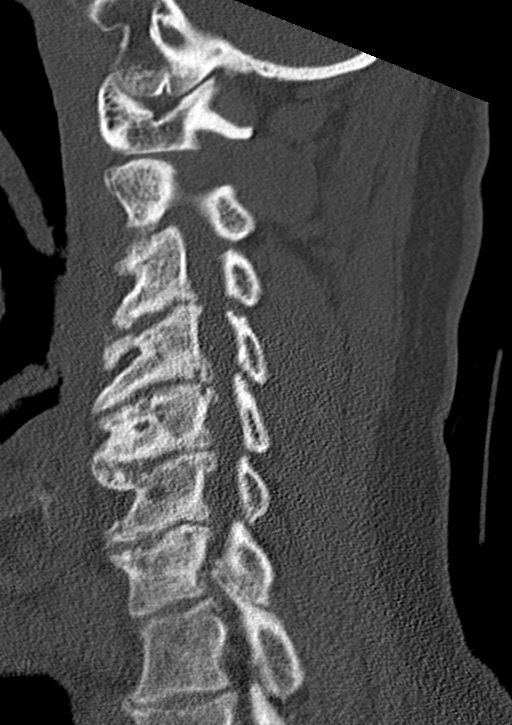
[im 41/61  bone]
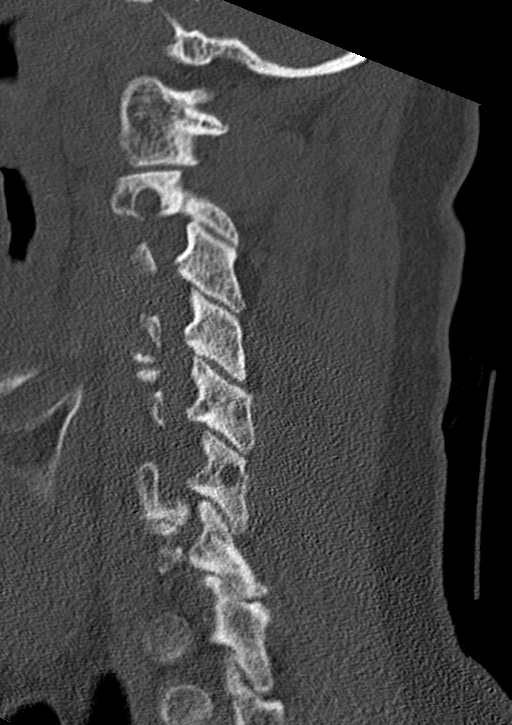

[13 of 35 positions shown; findings below may reference images not displayed]

FINDINGS: Alignment: There is straightening of the normal cervical spine
lordosis. There is no antero or retrolisthesis. There is no jumped
or perched facets or other evidence of traumatic malalignment.

Skull base and vertebrae: Skull base alignment is maintained.
Vertebral body heights are preserved. There is no evidence of acute
fracture. There is no suspicious osseous lesion.

Soft tissues and spinal canal: No prevertebral fluid or swelling. No
visible canal hematoma.

Disc levels: There is multilevel intervertebral disc space narrowing
with associated degenerative endplate change and facet arthropathy,
unchanged since the recent study from 02/15/2022.

Upper chest: The imaged portions of the right lung apex are clear.
The left lung apex is not imaged.

Other: None.
IMPRESSION: No acute fracture or traumatic malalignment of the cervical spine.

## 2022-05-02 ENCOUNTER — Emergency Department (HOSPITAL_COMMUNITY)
Admission: EM | Admit: 2022-05-02 | Discharge: 2022-05-02 | Disposition: A | Discharge status: Skilled Nursing Facility | Payer: Medicare Other | Attending: Emergency Medicine | Admitting: Emergency Medicine

## 2022-05-02 ENCOUNTER — Encounter (HOSPITAL_COMMUNITY): Payer: Self-pay

## 2022-05-02 ENCOUNTER — Other Ambulatory Visit: Payer: Self-pay

## 2022-05-02 ENCOUNTER — Emergency Department (HOSPITAL_COMMUNITY): Payer: Medicare Other

## 2022-05-02 DIAGNOSIS — W19XXXA Unspecified fall, initial encounter: Secondary | ICD-10-CM | POA: Diagnosis not present

## 2022-05-02 DIAGNOSIS — S0181XA Laceration without foreign body of other part of head, initial encounter: Secondary | ICD-10-CM | POA: Diagnosis not present

## 2022-05-02 DIAGNOSIS — S01112A Laceration without foreign body of left eyelid and periocular area, initial encounter: Secondary | ICD-10-CM | POA: Insufficient documentation

## 2022-05-02 DIAGNOSIS — S0990XA Unspecified injury of head, initial encounter: Secondary | ICD-10-CM

## 2022-05-02 DIAGNOSIS — Z7982 Long term (current) use of aspirin: Secondary | ICD-10-CM | POA: Insufficient documentation

## 2022-05-02 DIAGNOSIS — Z91040 Latex allergy status: Secondary | ICD-10-CM | POA: Diagnosis not present

## 2022-05-02 MED ORDER — LIDOCAINE-EPINEPHRINE (PF) 2 %-1:200000 IJ SOLN
10.0000 mL | Freq: Once | INTRAMUSCULAR | Status: AC
  Administered 2022-05-02: 10 mL
  Filled 2022-05-02: qty 20

## 2022-05-02 NOTE — ED Notes (Signed)
Patient was repositioned in bed

## 2022-05-02 NOTE — ED Notes (Signed)
Pt given turkey sandwich and sprite.  

## 2022-05-02 NOTE — ED Notes (Signed)
Patient continues to yell out of the room. I explained to the patient that he is being transported back to the facility by [facility].  ?

## 2022-05-02 NOTE — ED Provider Notes (Signed)
?[facility] DEPT ?Provider Note ? ? ?CSN: [id number] ?Arrival date & time: 05/02/22  1407 ? ?  ? ?History ? ?Chief Complaint  ?Patient presents with  ? Fall  ? ? ?[name] is a 56 y.o. male. ? ?56 year old male with past medical history significant for anoxic brain injury, A-fib who is bedbound presents today for evaluation of head injury following a fall.  This was an unwitnessed fall. Patient was at his baseline when nursing staff attended to him. Patient presents from [facility].  This is a recurrent issue for the patient.  He was recently seen for the same issue on 5/2.  In 2023 alone patient has had about 6 visits for fall.  He has a small laceration to his left eyebrow, and nasal bridge.  Without active bleeding at this time.  C-collar is in place.  Per nursing home patient continues to try and get out of bed resulting in falls.  Patient is a poor historian.   ? ?The history is provided by the patient. No language interpreter was used.  ?Fall ? ? ?  ? ?Home Medications ?Prior to Admission medications   ?Medication Sig Start Date End Date Taking? Authorizing Provider  ?acetaminophen (TYLENOL) 325 MG tablet Take 650 mg by mouth every 4 (four) hours as needed for mild pain.    [provider]  ?albuterol (ACCUNEB) 0.63 MG/3ML nebulizer solution Take 1 ampule by nebulization every 6 (six) hours as needed for wheezing or shortness of breath (COPD).    [provider]  ?aspirin 81 MG chewable tablet Chew 81 mg by mouth every morning.    [provider]  ?Carboxymeth-Glycerin-Polysorb (REFRESH OPTIVE ADVANCED) 0.5-1-0.5 % SOLN Place 1 drop into both eyes 3 (three) times daily.    [provider]  ?clonazePAM (KLONOPIN) 0.5 MG tablet Take 0.5 mg by mouth 2 (two) times daily. 8:30am and 4:30pm    [provider]  ?divalproex (DEPAKOTE SPRINKLE) 125 MG capsule Take 500 mg by mouth 3 (three) times daily. 8:30am, 12:30pm, 8:30pm 07/18/13    [provider]  ?docusate sodium (COLACE) 100 MG capsule Take 100 mg by mouth every morning.    [provider]  ?haloperidol (HALDOL) 5 MG tablet Take 5 mg by mouth 2 (two) times daily.    [provider]  ?levothyroxine (SYNTHROID) 200 MCG tablet Take 200 mcg by mouth daily before breakfast. Take with a 25 mcg tablet for a total dose of 225 mcg every morning    [provider]  ?levothyroxine (SYNTHROID) 25 MCG tablet Take 25 mcg by mouth daily before breakfast. Take with a 200 mcg tablet for a total dose of 225 mcg every morning    [provider]  ?Magnesium Hydroxide (MILK OF MAGNESIA PO) Take 30 mLs by mouth daily as needed (constipation).    [provider]  ?pantoprazole sodium (PROTONIX) 40 mg/20 mL PACK Take 40 mg by mouth every morning. 11/30/11   Ernest Haber, MD  ?polyethylene glycol (MIRALAX / GLYCOLAX) packet Take 17 g by mouth 2 (two) times daily.    [provider]  ?senna (SENOKOT) 8.6 MG tablet Take 2 tablets by mouth 2 (two) times daily.    [provider]  ?sertraline (ZOLOFT) 50 MG tablet Take 75 mg by mouth every morning.    [provider]  ?traMADol (ULTRAM) 50 MG tablet Take 50 mg by mouth every 6 (six) hours as needed for moderate pain.    [provider]  ?   ? ?Allergies    ?Ativan [lorazepam], Latex, Morphine and related, Penicillins, Pyridium [phenazopyridine hcl], and Soy allergy   ? ?Review of Systems   ?Review of Systems  ?Unable to perform ROS: Dementia  ? ?Physical Exam ?Updated Vital Signs ?BP 111/75 (BP Location: Left Arm)   Pulse 71   Temp 97.9 ?F (36.6 ?C) (Oral)   Resp 16   Ht 5\' 9"  (1.753 m)   Wt 81.6 kg   SpO2 97%   BMI 26.58 kg/m?  ?Physical Exam ?Vitals and nursing note reviewed.  ?Constitutional:   ?   General: He is not in acute distress. ?   Appearance: Normal appearance. He is not ill-appearing.  ?HENT:  ?   Head: Normocephalic and atraumatic.  ?   Comments: Nasal  bridge deformity noted. chronic ?   Nose: Nose normal.  ?Eyes:  ?   Conjunctiva/sclera: Conjunctivae normal.  ?Neck:  ?   Comments: C collar in place ?Pulmonary:  ?   Effort: Pulmonary effort is normal. No respiratory distress.  ?Abdominal:  ?   General: There is no distension.  ?   Palpations: Abdomen is soft.  ?   Tenderness: There is no abdominal tenderness. There is no guarding.  ?Musculoskeletal:     ?   General: No deformity.  ?Skin: ?   Findings: No rash.  ?   Comments: 2 lacerations noted.  One is medial to the left eyebrow.  It is 0.5 cm in length.  The other is lateral to simply to the left eyebrow 0.5 cm.  Abrasions noted to left cheek, and nose without apparent laceration.  ?Neurological:  ?   Mental Status: He is alert.  ? ? ?ED Results / Procedures / Treatments   ?Labs ?(all labs ordered are listed, but only abnormal results are displayed) ?Labs Reviewed - No data to display ? ?EKG ?None ? ?Radiology ?No results found. ? ?Procedures ?Marland Kitchen.Laceration Repair ? ?Date/Time: 05/02/2022 4:12 PM ?Performed by: Evlyn Courier, PA-C ?Authorized by: Evlyn Courier, PA-C  ? ?Consent:  ?  Consent obtained:  Verbal ?  Consent given by:  Patient ?  Risks discussed:  Infection, need for additional repair, pain, poor cosmetic result and poor wound healing ?  Alternatives discussed:  No treatment and delayed treatment ?Universal protocol:  ?  Procedure explained and questions answered to patient or proxy's satisfaction: yes   ?  Relevant documents present and verified: yes   ?  Test results available: yes   ?  Imaging studies available: yes   ?  Patient identity confirmed:  Verbally with patient and arm band ?Anesthesia:  ?  Anesthesia method:  Local infiltration ?  Local anesthetic:  Lidocaine 2% WITH epi ?Laceration details:  ?  Location:  Face ?  Face location:  L eyebrow ?  Length (cm):  1.5 ?Pre-procedure details:  ?  Preparation:  Patient was prepped and draped in usual sterile fashion and imaging obtained to evaluate for  foreign bodies ?Exploration:  ?  Imaging outcome: foreign body not noted   ?Treatment:  ?  Area cleansed with:  Povidone-iodine and saline ?  Amount of cleaning:  Standard ?  Debridement:  None ?  Undermining:  None ?Skin repair:  ?  Repair method:  Sutures ?  Suture size:  5-0 ?  Suture material:  Prolene ?  Suture technique:  Simple interrupted ?  Number of sutures:  2 ?Approximation:  ?  Approximation:  Close ?Repair type:  ?  Repair type:  Simple ?Post-procedure details:  ?  Dressing:  Open (no dressing) ?Marland Kitchen.Laceration Repair ? ?Date/Time: 05/02/2022 4:13 PM ?Performed by: Evlyn Courier, PA-C ?Authorized by: Evlyn Courier, PA-C  ? ?Consent:  ?  Consent obtained:  Verbal ?  Consent given by:  Patient ?  Risks discussed:  Infection, need for additional repair, pain, poor cosmetic result and poor wound healing ?  Alternatives discussed:  No treatment and delayed treatment ?Universal protocol:  ?  Procedure explained and questions answered to patient or proxy's satisfaction: yes   ?  Relevant documents present and verified: yes   ?  Test results available: yes   ?  Imaging studies available: yes   ?  Patient identity confirmed:  Verbally with patient ?Anesthesia:  ?  Anesthesia method:  Local infiltration ?  Local anesthetic:  Lidocaine 2% WITH epi ?Laceration details:  ?  Location:  Face ?  Face location:  Forehead ?  Length (cm):  0.5 ?Pre-procedure details:  ?  Preparation:  Patient was prepped and draped in usual sterile fashion and imaging obtained to evaluate for foreign bodies ?Exploration:  ?  Imaging outcome: foreign body not noted   ?Treatment:  ?  Area cleansed with:  Povidone-iodine and saline ?  Amount of cleaning:  Standard ?  Debridement:  None ?  Undermining:  None ?Skin repair:  ?  Repair method:  Sutures ?  Suture size:  5-0 ?  Suture material:  Prolene ?  Number of sutures:  1 ?Approximation:  ?  Approximation:  Close ?Repair type:  ?  Repair type:  Simple ?Post-procedure details:  ?  Dressing:  Open (no  dressing) ?  Procedure completion:  Tolerated well, no immediate complications  ? ? ?Medications Ordered in ED ?Medications - No data to display ? ?ED Course/ Medical Decision Making/ A&P ?  ?                        ?Med

## 2022-05-02 NOTE — ED Triage Notes (Signed)
[facility] reports pt coming from [facility] for a fall, unwitnessed. Pt with laceration to bridge of nose and left face. EMS reports pt has hx of dementia and brain injury. C-collar in place with EMS. ?

## 2022-05-02 NOTE — ED Notes (Signed)
[facility] is here for transport.  

## 2022-05-02 NOTE — ED Notes (Signed)
Bed alarm and posey belt in place due to pt continuously trying to get out of the bed. ?

## 2022-05-02 NOTE — ED Notes (Signed)
Request for transport was placed at 1637.  ?

## 2022-05-02 NOTE — Discharge Instructions (Addendum)
Your CT scan including CT of the head, CT of the face, CT of the neck did not show any concerning findings.  He had 2 lacerations which were repaired with 3 total stitches.  He also had a couple abrasions to the nasal bridge, into the left cheek which did not require sutures.  Will need to be removed in 7 to 10 days please complete at your nursing facility, your primary care provider can also take these out, if need be you can return to the emergency room.  If you have any concerning signs you can return to the emergency room for evaluation. ?

## 2022-05-02 NOTE — ED Notes (Signed)
This RN spoke w/ legal guardian  ?

## 2022-05-02 NOTE — ED Notes (Signed)
Pt given another [place] sandwich and beverage. ?

## 2022-05-02 NOTE — ED Notes (Signed)
Walked into room, patient was at the edge of the bed. Patient was repositioned in bad. Patient began hitting and scratching myself and other staff. Advised patient, he was safe and would be transported back home by [facility].  ?

## 2022-05-08 ENCOUNTER — Emergency Department (HOSPITAL_COMMUNITY): Payer: Medicare Other

## 2022-05-08 ENCOUNTER — Other Ambulatory Visit: Payer: Self-pay

## 2022-05-08 ENCOUNTER — Emergency Department (HOSPITAL_COMMUNITY)
Admission: EM | Admit: 2022-05-08 | Discharge: 2022-05-09 | Disposition: A | Discharge status: Skilled Nursing Facility | Payer: Medicare Other | Attending: Emergency Medicine | Admitting: Emergency Medicine

## 2022-05-08 DIAGNOSIS — Z91040 Latex allergy status: Secondary | ICD-10-CM | POA: Insufficient documentation

## 2022-05-08 DIAGNOSIS — S0990XA Unspecified injury of head, initial encounter: Secondary | ICD-10-CM | POA: Diagnosis not present

## 2022-05-08 DIAGNOSIS — M25511 Pain in right shoulder: Secondary | ICD-10-CM | POA: Diagnosis not present

## 2022-05-08 DIAGNOSIS — W19XXXA Unspecified fall, initial encounter: Secondary | ICD-10-CM | POA: Insufficient documentation

## 2022-05-08 DIAGNOSIS — S0081XA Abrasion of other part of head, initial encounter: Secondary | ICD-10-CM | POA: Insufficient documentation

## 2022-05-08 DIAGNOSIS — S0993XA Unspecified injury of face, initial encounter: Secondary | ICD-10-CM | POA: Diagnosis present

## 2022-05-08 NOTE — ED Notes (Signed)
[name] the sister called and stated that the nurse from the facility told EMS he was lying in a pull of blood but the CNA had cleaned the patient up before EMS arrived to the facility - notified MD ?

## 2022-05-08 NOTE — ED Notes (Signed)
Transport called to take pt back to [facility].  ?

## 2022-05-08 NOTE — ED Provider Notes (Signed)
?[facility] DEPT ?Provider Note ? ? ?CSN: [id number] ?Arrival date & time: 05/08/22  2032 ? ?  ? ?History ? ?Chief Complaint  ?Patient presents with  ? Fall  ?  Per EMS patient fell around 1900 and the facility had him placed on a matress on the floor upon arrival - patient has history of TBI and brain injury - per EMS facility was operating with all new nurses and no one was aware of base line - patient has bruising and scrapes from previous fall per EMS  ? ? ?[name] is a 56 y.o. male. ? ? ?Fall ? ? 56 year old male presents to the ED with history of unwitnessed fall. EMS reports fall occurred around 1900, and the facility had him on a mattress on the floor when they arrived. They contacted patient's sister who said patient is confused at baseline and does not comprehend much. HPI is limited secondary to patient's cognitive status. Associated symptoms obtained were right shoulder pain. Patient resides at [facility]. Care facility denies anticoagulation of patient.  ? ?Past medical history significant for recurrent falls, anoxic brain injury, Quadriplegia, seizure disorder, ischemic cardiomyopathy.. ? ?Home Medications ?Prior to Admission medications   ?Medication Sig Start Date End Date Taking? Authorizing Provider  ?acetaminophen (TYLENOL) 325 MG tablet Take 650 mg by mouth every 4 (four) hours as needed for mild pain.    [provider]  ?albuterol (ACCUNEB) 0.63 MG/3ML nebulizer solution Take 1 ampule by nebulization every 6 (six) hours as needed for wheezing or shortness of breath (COPD).    [provider]  ?aspirin 81 MG chewable tablet Chew 81 mg by mouth every morning.    [provider]  ?Carboxymeth-Glycerin-Polysorb (REFRESH OPTIVE ADVANCED) 0.5-1-0.5 % SOLN Place 1 drop into both eyes 3 (three) times daily.    [provider]  ?clonazePAM (KLONOPIN) 0.5 MG tablet Take 0.5 mg by mouth 2 (two) times daily. 8:30am  and 4:30pm    [provider]  ?divalproex (DEPAKOTE SPRINKLE) 125 MG capsule Take 500 mg by mouth 3 (three) times daily. 8:30am, 12:30pm, 8:30pm 07/18/13   [provider]  ?docusate sodium (COLACE) 100 MG capsule Take 100 mg by mouth every morning.    [provider]  ?haloperidol (HALDOL) 5 MG tablet Take 5 mg by mouth 2 (two) times daily.    [provider]  ?levothyroxine (SYNTHROID) 200 MCG tablet Take 200 mcg by mouth daily before breakfast. Take with a 25 mcg tablet for a total dose of 225 mcg every morning    [provider]  ?levothyroxine (SYNTHROID) 25 MCG tablet Take 25 mcg by mouth daily before breakfast. Take with a 200 mcg tablet for a total dose of 225 mcg every morning    [provider]  ?Magnesium Hydroxide (MILK OF MAGNESIA PO) Take 30 mLs by mouth daily as needed (constipation).    [provider]  ?pantoprazole sodium (PROTONIX) 40 mg/20 mL PACK Take 40 mg by mouth every morning. 11/30/11   Ernest Haber, MD  ?polyethylene glycol (MIRALAX / GLYCOLAX) packet Take 17 g by mouth 2 (two) times daily.    [provider]  ?senna (SENOKOT) 8.6 MG tablet Take 2 tablets by mouth 2 (two) times daily.    [provider]  ?sertraline (ZOLOFT) 50 MG tablet Take 75 mg by mouth every morning.    [provider]  ?traMADol (ULTRAM) 50 MG tablet Take 50 mg by mouth  every 6 (six) hours as needed for moderate pain.    [provider]  ?   ? ?Allergies    ?Ativan [lorazepam], Latex, Morphine and related, Penicillins, Pyridium [phenazopyridine hcl], and Soy allergy   ? ?Review of Systems   ?Review of Systems  ?Reason unable to perform ROS: Thorough ROS not able to obtained due to patient's baseline mentation.  ?Musculoskeletal:   ?     R shoulder pain  ? ?Physical Exam ?Updated Vital Signs ?BP 112/76   Pulse 67   Temp 98.7 ?F (37.1 ?C) (Oral)   Resp 15   SpO2 97%  ?Physical Exam ?Vitals and nursing note reviewed.   ?Constitutional:   ?   General: He is not in acute distress. ?   Appearance: Normal appearance. He is obese. He is not ill-appearing, toxic-appearing or diaphoretic.  ?HENT:  ?   Head: Normocephalic.  ?   Comments: Obvious nasal deformity. Patient has a history of nasal bone and septum fracture documented on 05/02/22 from a prior fall. Multiple linear abrasions noted on left side of patient's face. All are shallow and have appropriate scabbing. Resolving ecchymosis noted surround lateral aspect of left eye. No septal hematoma noted.  ?No septal hematoma noted.  ?   Right Ear: Tympanic membrane, ear canal and external ear normal.  ?   Left Ear: Tympanic membrane, ear canal and external ear normal.  ?   Mouth/Throat:  ?   Mouth: Mucous membranes are moist.  ?   Pharynx: Oropharynx is clear. No oropharyngeal exudate or posterior oropharyngeal erythema.  ?Eyes:  ?   General:     ?   Right eye: No discharge.     ?   Left eye: No discharge.  ?   Extraocular Movements: Extraocular movements intact.  ?   Conjunctiva/sclera: Conjunctivae normal.  ?   Pupils: Pupils are equal, round, and reactive to light.  ?Cardiovascular:  ?   Rate and Rhythm: Normal rate and regular rhythm.  ?   Pulses: Normal pulses.  ?   Heart sounds: Normal heart sounds. No murmur heard. ?Pulmonary:  ?   Effort: Pulmonary effort is normal.  ?   Breath sounds: Normal breath sounds. No wheezing or rales.  ?Abdominal:  ?   General: Abdomen is flat.  ?   Palpations: Abdomen is soft.  ?   Tenderness: There is no abdominal tenderness. There is no guarding.  ?   Comments: G tube site noted on abdomen. Area is covered. Site is non-erythematic/cellulitic appearing. No purulent drainage noted.   ?Musculoskeletal:  ?   Cervical back: Normal range of motion and neck supple. No tenderness.  ?Skin: ?   Comments: Full exposure of the patient showed no pressure ulcers or obvious signs of bleeding/injury besides what has already been noted.   ?Neurological:  ?   Mental  Status: He is alert.  ? ? ?ED Results / Procedures / Treatments   ?Labs ?(all labs ordered are listed, but only abnormal results are displayed) ?Labs Reviewed - No data to display ? ?EKG ?None ? ?Radiology ?DG Shoulder Right ? ?Result Date: 05/08/2022 ?CLINICAL DATA:  Unwitnessed fall EXAM: RIGHT SHOULDER - 2+ VIEW COMPARISON:  07/27/2021 FINDINGS: Old healed fracture with deformity of the proximal right humerus. No acute fracture, subluxation or dislocation. No change since prior study. IMPRESSION: Old healed proximal right humeral fracture with deformity. No acute bony abnormality. Electronically Signed   By: Rolm Baptise M.D.   On: 05/08/2022 22:21  ? ?  CT Head Wo Contrast ? ?Result Date: 05/08/2022 ?CLINICAL DATA:  Trauma EXAM: CT HEAD WITHOUT CONTRAST TECHNIQUE: Contiguous axial images were obtained from the base of the skull through the vertex without intravenous contrast. RADIATION DOSE REDUCTION: This exam was performed according to the departmental dose-optimization program which includes automated exposure control, adjustment of the mA and/or kV according to patient size and/or use of iterative reconstruction technique. COMPARISON:  05/02/2022 FINDINGS: Brain: No acute intracranial findings are seen. Cortical sulci are prominent. There is subtle decreased density in the periventricular white matter. There is no focal edema or mass effect. Vascular: Scattered arterial calcifications are seen. Skull: No fracture is seen in the calvarium. Sinuses/Orbits: There is mild mucosal thickening in the maxillary sinuses. Other: None. IMPRESSION: No acute intracranial findings are seen in the noncontrast CT brain. Electronically Signed   By: Elmer Picker M.D.   On: 05/08/2022 21:01  ? ?CT Cervical Spine Wo Contrast ? ?Result Date: 05/08/2022 ?CLINICAL DATA:  Trauma EXAM: CT CERVICAL SPINE WITHOUT CONTRAST TECHNIQUE: Multidetector CT imaging of the cervical spine was performed without intravenous contrast.  Multiplanar CT image reconstructions were also generated. RADIATION DOSE REDUCTION: This exam was performed according to the departmental dose-optimization program which includes automated exposure control, adjustmen

## 2022-05-08 NOTE — ED Triage Notes (Signed)
Per EMS patient fell around 1900 and the facility had him placed on a matress on the floor upon arrival - patient has history of TBI and brain injury - per EMS facility was operating with all new nurses and no one was aware of base line - patient has bruising and scrapes from previous fall per EMS ? ?Called sister and sister says patient is confused at baseline and does not comprehend much - sisters name is [name] and can be reached at [phone] ? ?

## 2022-05-15 ENCOUNTER — Other Ambulatory Visit: Payer: Self-pay

## 2022-05-15 ENCOUNTER — Emergency Department (HOSPITAL_COMMUNITY): Payer: Medicare Other

## 2022-05-15 ENCOUNTER — Emergency Department (HOSPITAL_COMMUNITY)
Admission: EM | Admit: 2022-05-15 | Discharge: 2022-05-15 | Disposition: A | Discharge status: Home / Self Care | Payer: Medicare Other | Attending: Emergency Medicine | Admitting: Emergency Medicine

## 2022-05-15 DIAGNOSIS — Z7982 Long term (current) use of aspirin: Secondary | ICD-10-CM | POA: Diagnosis not present

## 2022-05-15 DIAGNOSIS — R296 Repeated falls: Secondary | ICD-10-CM | POA: Insufficient documentation

## 2022-05-15 DIAGNOSIS — W1839XA Other fall on same level, initial encounter: Secondary | ICD-10-CM | POA: Insufficient documentation

## 2022-05-15 DIAGNOSIS — S0012XA Contusion of left eyelid and periocular area, initial encounter: Secondary | ICD-10-CM | POA: Diagnosis not present

## 2022-05-15 DIAGNOSIS — S0591XA Unspecified injury of right eye and orbit, initial encounter: Secondary | ICD-10-CM | POA: Diagnosis present

## 2022-05-15 DIAGNOSIS — S0011XA Contusion of right eyelid and periocular area, initial encounter: Secondary | ICD-10-CM | POA: Insufficient documentation

## 2022-05-15 DIAGNOSIS — N390 Urinary tract infection, site not specified: Secondary | ICD-10-CM | POA: Insufficient documentation

## 2022-05-15 DIAGNOSIS — Z20822 Contact with and (suspected) exposure to covid-19: Secondary | ICD-10-CM | POA: Diagnosis not present

## 2022-05-15 DIAGNOSIS — Z91040 Latex allergy status: Secondary | ICD-10-CM | POA: Insufficient documentation

## 2022-05-15 DIAGNOSIS — R569 Unspecified convulsions: Secondary | ICD-10-CM | POA: Diagnosis not present

## 2022-05-15 DIAGNOSIS — R4182 Altered mental status, unspecified: Secondary | ICD-10-CM | POA: Diagnosis present

## 2022-05-15 LAB — URINALYSIS, ROUTINE W REFLEX MICROSCOPIC
Bilirubin Urine: NEGATIVE
Glucose, UA: NEGATIVE mg/dL
Hgb urine dipstick: NEGATIVE
Ketones, ur: 5 mg/dL — AB
Nitrite: NEGATIVE
Protein, ur: 30 mg/dL — AB
Specific Gravity, Urine: 1.023 (ref 1.005–1.030)
WBC, UA: >50 WBC/hpf — ABNORMAL HIGH (ref 0–5)
pH: 7 (ref 5.0–8.0)

## 2022-05-15 LAB — COMPREHENSIVE METABOLIC PANEL
ALT: 11 U/L (ref 0–44)
AST: 17 U/L (ref 15–41)
Albumin: 3.6 g/dL (ref 3.5–5.0)
Alkaline Phosphatase: 53 U/L (ref 38–126)
Anion gap: 6 (ref 5–15)
BUN: 17 mg/dL (ref 6–20)
CO2: 27 mmol/L (ref 22–32)
Calcium: 8.9 mg/dL (ref 8.9–10.3)
Chloride: 109 mmol/L (ref 98–111)
Creatinine, Ser: 1.06 mg/dL (ref 0.61–1.24)
GFR, Estimated: >60 mL/min (ref >60)
Glucose, Bld: 72 mg/dL (ref 70–99)
Potassium: 4.5 mmol/L (ref 3.5–5.1)
Sodium: 142 mmol/L (ref 135–145)
Total Bilirubin: 0.5 mg/dL (ref 0.3–1.2)
Total Protein: 7.4 g/dL (ref 6.5–8.1)

## 2022-05-15 LAB — CBC WITH DIFFERENTIAL/PLATELET
Abs Immature Granulocytes: 0.02 10*3/uL (ref 0.00–0.07)
Basophils Absolute: 0 10*3/uL (ref 0.0–0.1)
Basophils Relative: 0 %
Eosinophils Absolute: 0.1 10*3/uL (ref 0.0–0.5)
Eosinophils Relative: 1 %
HCT: 41.7 % (ref 39.0–52.0)
Hemoglobin: 13.1 g/dL (ref 13.0–17.0)
Immature Granulocytes: 0 %
Lymphocytes Relative: 13 %
Lymphs Abs: 1.1 10*3/uL (ref 0.7–4.0)
MCH: 29.3 pg (ref 26.0–34.0)
MCHC: 31.4 g/dL (ref 30.0–36.0)
MCV: 93.3 fL (ref 80.0–100.0)
Monocytes Absolute: 0.7 10*3/uL (ref 0.1–1.0)
Monocytes Relative: 8 %
Neutro Abs: 6.5 10*3/uL (ref 1.7–7.7)
Neutrophils Relative %: 78 %
Platelets: 106 10*3/uL — ABNORMAL LOW (ref 150–400)
RBC: 4.47 MIL/uL (ref 4.22–5.81)
RDW: 14.7 % (ref 11.5–15.5)
WBC: 8.3 10*3/uL (ref 4.0–10.5)
nRBC: 0.2 % (ref 0.0–0.2)

## 2022-05-15 LAB — RAPID URINE DRUG SCREEN, HOSP PERFORMED
Amphetamines: NOT DETECTED
Barbiturates: NOT DETECTED
Benzodiazepines: NOT DETECTED
Cocaine: NOT DETECTED
Opiates: NOT DETECTED
Tetrahydrocannabinol: NOT DETECTED

## 2022-05-15 LAB — RESP PANEL BY RT-PCR (FLU A&B, COVID) ARPGX2
Influenza A by PCR: NEGATIVE
Influenza B by PCR: NEGATIVE
SARS Coronavirus 2 by RT PCR: NEGATIVE

## 2022-05-15 LAB — ETHANOL: Alcohol, Ethyl (B): <10 mg/dL (ref <10)

## 2022-05-15 MED ORDER — CEPHALEXIN 500 MG PO CAPS: 500.0000 mg | ORAL_CAPSULE | Freq: Four times a day (QID) | ORAL | 0 refills | Status: DC

## 2022-05-15 MED ORDER — SODIUM CHLORIDE 0.9 % IV BOLUS: 500.0000 mL | Freq: Once | INTRAVENOUS | Status: DC

## 2022-05-15 MED ORDER — CEPHALEXIN 500 MG PO CAPS
500.0000 mg | ORAL_CAPSULE | Freq: Once | ORAL | Status: AC
Start: 2022-05-15 — End: 2022-05-15
  Administered 2022-05-15: 500 mg via ORAL
  Filled 2022-05-15: qty 1

## 2022-05-15 NOTE — ED Notes (Signed)
Patient's legal guardian, Shiela Mayer notified of patient's discharge via phone.

## 2022-05-15 NOTE — ED Notes (Signed)
[facility] contacted for transport 

## 2022-05-15 NOTE — ED Provider Notes (Signed)
[facility]-EMERGENCY DEPT Provider Note   CSN: [id number] Arrival date & time: 05/15/22  1545     History  No chief complaint on file.   [name] is a 56 y.o. male.  HPI Patient's family members were visiting today and were concerned about his health and welfare so encouraged the facility where he stays to send him here.  Patient is unable to state why he is here.    Home Medications Prior to Admission medications   Medication Sig Start Date End Date Taking? Authorizing Provider  cephALEXin (KEFLEX) 500 MG capsule Take 1 capsule (500 mg total) by mouth 4 (four) times daily. 05/15/22  Yes [name], MD  acetaminophen (TYLENOL) 325 MG tablet Take 650 mg by mouth every 4 (four) hours as needed for mild pain.    [provider]  albuterol (ACCUNEB) 0.63 MG/3ML nebulizer solution Take 1 ampule by nebulization every 6 (six) hours as needed for wheezing or shortness of breath (COPD).    [provider]  aspirin 81 MG chewable tablet Chew 81 mg by mouth every morning.    [provider]  Carboxymeth-Glycerin-Polysorb (REFRESH OPTIVE ADVANCED) 0.5-1-0.5 % SOLN Place 1 drop into both eyes 3 (three) times daily.    [provider]  clonazePAM (KLONOPIN) 0.5 MG tablet Take 0.5 mg by mouth 2 (two) times daily. 8:30am and 4:30pm    [provider]  divalproex (DEPAKOTE SPRINKLE) 125 MG capsule Take 500 mg by mouth 3 (three) times daily. 8:30am, 12:30pm, 8:30pm 07/18/13   [provider]  docusate sodium (COLACE) 100 MG capsule Take 100 mg by mouth every morning.    [provider]  haloperidol (HALDOL) 5 MG tablet Take 5 mg by mouth 2 (two) times daily.    [provider]  levothyroxine (SYNTHROID) 200 MCG tablet Take 200 mcg by mouth daily before breakfast. Take with a 25 mcg tablet for a total dose of 225 mcg every morning    [provider]  levothyroxine (SYNTHROID) 25 MCG tablet Take  25 mcg by mouth daily before breakfast. Take with a 200 mcg tablet for a total dose of 225 mcg every morning    [provider]  Magnesium Hydroxide (MILK OF MAGNESIA PO) Take 30 mLs by mouth daily as needed (constipation).    [provider]  pantoprazole sodium (PROTONIX) 40 mg/20 mL PACK Take 40 mg by mouth every morning. 11/30/11   [name], MD  polyethylene glycol (MIRALAX / GLYCOLAX) packet Take 17 g by mouth 2 (two) times daily.    [provider]  senna (SENOKOT) 8.6 MG tablet Take 2 tablets by mouth 2 (two) times daily.    [provider]  sertraline (ZOLOFT) 50 MG tablet Take 75 mg by mouth every morning.    [provider]  traMADol (ULTRAM) 50 MG tablet Take 50 mg by mouth every 6 (six) hours as needed for moderate pain.    [provider]      Allergies    Ativan [lorazepam], Latex, Morphine and related, Penicillins, Pyridium [phenazopyridine hcl], and Soy allergy    Review of Systems   Review of Systems  Physical Exam Updated Vital Signs BP 113/81   Pulse 88   Temp 98 F (36.7 C) (Oral)   Resp 20   Ht 6' (1.829 m)   Wt 106.6 kg   SpO2 99%   BMI 31.87 kg/m  Physical Exam Vitals and nursing note reviewed.  Constitutional:  General: He is not in acute distress.    Appearance: He is well-developed. He is ill-appearing. He is not toxic-appearing or diaphoretic.  HENT:     Head: Normocephalic.     Comments: Scattered periorbital contusions bilaterally with abrasions and both acute and subacute appearing injuries.  No crepitation of the face or scalp structures.  Normal TMJ function.  He is noted to tend to grind his teeth when not being distracted.    Right Ear: External ear normal.     Left Ear: External ear normal.     Mouth/Throat:     Mouth: Mucous membranes are moist.  Eyes:     Conjunctiva/sclera: Conjunctivae normal.     Pupils: Pupils are equal, round, and reactive to light.  Neck:     Trachea:  Phonation normal.  Cardiovascular:     Rate and Rhythm: Normal rate and regular rhythm.     Heart sounds: Normal heart sounds.  Pulmonary:     Effort: Pulmonary effort is normal.     Breath sounds: Normal breath sounds.  Abdominal:     Palpations: Abdomen is soft.     Tenderness: There is no abdominal tenderness.  Musculoskeletal:        General: Normal range of motion.     Cervical back: Normal range of motion and neck supple.  Skin:    General: Skin is warm and dry.  Neurological:     Mental Status: He is alert.     Cranial Nerves: No cranial nerve deficit.     Motor: No abnormal muscle tone.     Coordination: Coordination normal.     Comments: Nonspecific spasticity of the right forearm and hand.  Able to elevate both arms and legs off the stretcher independently.  Follows commands accurately.  Poor memory.  Psychiatric:        Mood and Affect: Mood normal.        Behavior: Behavior normal.    ED Results / Procedures / Treatments   Labs (all labs ordered are listed, but only abnormal results are displayed) Labs Reviewed  CBC WITH DIFFERENTIAL/PLATELET - Abnormal; Notable for the following components:      Result Value   Platelets 106 (*)    All other components within normal limits  URINALYSIS, ROUTINE W REFLEX MICROSCOPIC - Abnormal; Notable for the following components:   APPearance CLOUDY (*)    Ketones, ur 5 (*)    Protein, ur 30 (*)    Leukocytes,Ua LARGE (*)    WBC, UA >50 (*)    Bacteria, UA RARE (*)    All other components within normal limits  RESP PANEL BY RT-PCR (FLU A&B, COVID) ARPGX2  URINE CULTURE  COMPREHENSIVE METABOLIC PANEL  ETHANOL  RAPID URINE DRUG SCREEN, HOSP PERFORMED    EKG None  Radiology CT Head Wo Contrast  Result Date: 05/15/2022 CLINICAL DATA:  History of prior falls EXAM: CT HEAD WITHOUT CONTRAST CT MAXILLOFACIAL WITHOUT CONTRAST CT CERVICAL SPINE WITHOUT CONTRAST TECHNIQUE: Multidetector CT imaging of the head, cervical spine,  and maxillofacial structures were performed using the standard protocol without intravenous contrast. Multiplanar CT image reconstructions of the cervical spine and maxillofacial structures were also generated. RADIATION DOSE REDUCTION: This exam was performed according to the departmental dose-optimization program which includes automated exposure control, adjustment of the mA and/or kV according to patient size and/or use of iterative reconstruction technique. COMPARISON:  05/09/2019 FINDINGS: CT HEAD FINDINGS Brain: No evidence of acute infarction, hemorrhage, hydrocephalus, extra-axial collection or  mass lesion/mass effect. Mild atrophic changes and chronic white matter ischemic changes are seen. Vascular: No hyperdense vessel or unexpected calcification. Skull: Normal. Negative for fracture or focal lesion. Other: None. CT MAXILLOFACIAL FINDINGS Osseous: Chronic nasal bone fractures are noted stable from prior exams. No acute fracture is identified. Orbits: Orbits and their contents are within normal limits. Sinuses: Paranasal sinuses are within normal limits. Soft tissues: No soft tissue abnormality is noted. CT CERVICAL SPINE FINDINGS Alignment: Within normal limits. Skull base and vertebrae: Cervical segments are well visualized. Vertebral body height is well maintained. Multilevel disc space narrowing is noted with associated osteophytic changes. Multilevel facet hypertrophic changes are noted as well. No acute fracture or acute facet abnormality is noted. Soft tissues and spinal canal: Surrounding soft tissue structures are within normal limits. Upper chest: Visualized lung apices are within normal limits. Other: None IMPRESSION: CT of the head: No acute intracranial abnormality noted. Mild atrophic and chronic white matter ischemic changes are noted. CT of the maxillofacial bones: Chronic nasal bone fractures. No acute bony abnormality noted. CT of the cervical spine: Multilevel degenerative change  without acute abnormality. Electronically Signed   By: Alcide CleverMark  Lukens M.D.   On: 05/15/2022 18:38   CT Cervical Spine Wo Contrast  Result Date: 05/15/2022 CLINICAL DATA:  History of prior falls EXAM: CT HEAD WITHOUT CONTRAST CT MAXILLOFACIAL WITHOUT CONTRAST CT CERVICAL SPINE WITHOUT CONTRAST TECHNIQUE: Multidetector CT imaging of the head, cervical spine, and maxillofacial structures were performed using the standard protocol without intravenous contrast. Multiplanar CT image reconstructions of the cervical spine and maxillofacial structures were also generated. RADIATION DOSE REDUCTION: This exam was performed according to the departmental dose-optimization program which includes automated exposure control, adjustment of the mA and/or kV according to patient size and/or use of iterative reconstruction technique. COMPARISON:  05/09/2019 FINDINGS: CT HEAD FINDINGS Brain: No evidence of acute infarction, hemorrhage, hydrocephalus, extra-axial collection or mass lesion/mass effect. Mild atrophic changes and chronic white matter ischemic changes are seen. Vascular: No hyperdense vessel or unexpected calcification. Skull: Normal. Negative for fracture or focal lesion. Other: None. CT MAXILLOFACIAL FINDINGS Osseous: Chronic nasal bone fractures are noted stable from prior exams. No acute fracture is identified. Orbits: Orbits and their contents are within normal limits. Sinuses: Paranasal sinuses are within normal limits. Soft tissues: No soft tissue abnormality is noted. CT CERVICAL SPINE FINDINGS Alignment: Within normal limits. Skull base and vertebrae: Cervical segments are well visualized. Vertebral body height is well maintained. Multilevel disc space narrowing is noted with associated osteophytic changes. Multilevel facet hypertrophic changes are noted as well. No acute fracture or acute facet abnormality is noted. Soft tissues and spinal canal: Surrounding soft tissue structures are within normal limits. Upper  chest: Visualized lung apices are within normal limits. Other: None IMPRESSION: CT of the head: No acute intracranial abnormality noted. Mild atrophic and chronic white matter ischemic changes are noted. CT of the maxillofacial bones: Chronic nasal bone fractures. No acute bony abnormality noted. CT of the cervical spine: Multilevel degenerative change without acute abnormality. Electronically Signed   By: Alcide CleverMark  Lukens M.D.   On: 05/15/2022 18:38   CT Maxillofacial WO CM  Result Date: 05/15/2022 CLINICAL DATA:  History of prior falls EXAM: CT HEAD WITHOUT CONTRAST CT MAXILLOFACIAL WITHOUT CONTRAST CT CERVICAL SPINE WITHOUT CONTRAST TECHNIQUE: Multidetector CT imaging of the head, cervical spine, and maxillofacial structures were performed using the standard protocol without intravenous contrast. Multiplanar CT image reconstructions of the cervical spine and maxillofacial structures were also  generated. RADIATION DOSE REDUCTION: This exam was performed according to the departmental dose-optimization program which includes automated exposure control, adjustment of the mA and/or kV according to patient size and/or use of iterative reconstruction technique. COMPARISON:  05/09/2019 FINDINGS: CT HEAD FINDINGS Brain: No evidence of acute infarction, hemorrhage, hydrocephalus, extra-axial collection or mass lesion/mass effect. Mild atrophic changes and chronic white matter ischemic changes are seen. Vascular: No hyperdense vessel or unexpected calcification. Skull: Normal. Negative for fracture or focal lesion. Other: None. CT MAXILLOFACIAL FINDINGS Osseous: Chronic nasal bone fractures are noted stable from prior exams. No acute fracture is identified. Orbits: Orbits and their contents are within normal limits. Sinuses: Paranasal sinuses are within normal limits. Soft tissues: No soft tissue abnormality is noted. CT CERVICAL SPINE FINDINGS Alignment: Within normal limits. Skull base and vertebrae: Cervical segments are  well visualized. Vertebral body height is well maintained. Multilevel disc space narrowing is noted with associated osteophytic changes. Multilevel facet hypertrophic changes are noted as well. No acute fracture or acute facet abnormality is noted. Soft tissues and spinal canal: Surrounding soft tissue structures are within normal limits. Upper chest: Visualized lung apices are within normal limits. Other: None IMPRESSION: CT of the head: No acute intracranial abnormality noted. Mild atrophic and chronic white matter ischemic changes are noted. CT of the maxillofacial bones: Chronic nasal bone fractures. No acute bony abnormality noted. CT of the cervical spine: Multilevel degenerative change without acute abnormality. Electronically Signed   By: Alcide Clever M.D.   On: 05/15/2022 18:38    Procedures Procedures    Medications Ordered in ED Medications  sodium chloride 0.9 % bolus 500 mL (has no administration in time range)  cephALEXin (KEFLEX) capsule 500 mg (has no administration in time range)    ED Course/ Medical Decision Making/ A&P Clinical Course as of 05/15/22 2036  Fri May 15, 2022  1647 Touch base with his legal guardian, [name], who states that she knew he was here, and states that the facility sent him here because he was "lethargic."  She states she last saw him about a month ago.  She understands that he has had numerous falls recently. [EW]  2031 I talked to [name], his legal guardian.  She is a relative, his sister.  She states that he does sleep on the floor on the mattress and is supposed to be wearing a helmet because of the frequent falls.  She understands the diagnosis and treatment plan. [EW]    Clinical Course User Index [EW] [name], MD                           Medical Decision Making Debilitated patient from nursing care facility presenting for evaluation of family concern for illness and disability.  He has had numerous prior ED visits for falling,  and evaluation for seizures and removal of gastrostomy tube.  Most recent fall, 7 days ago.  He has a history of traumatic brain injury.  Apparently his facility had some sleep on a mattress on the floor.  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  Problems Addressed: Urinary tract infection without hematuria, site unspecified: acute illness or injury    Details: Likely cause of lethargy noticed today by legal guardian  Amount and/or Complexity of Data Reviewed Independent Historian: caregiver    Details: Patient's legal guardian Labs: ordered.    Details: CBC, metabolic panel, urinalysis, viral  panel, alcohol level, urine drug screen-normal except urinalysis indicative of infection.  Urine culture ordered. Radiology: ordered and independent interpretation performed.    Details: CT head, CT cervical spine, CT maxillofacial.  No acute injuries.  Risk Prescription drug management. Decision regarding hospitalization. Risk Details: Patient resides at a nursing care facility, his mattress is on the floor because of frequent falls.  He has been seen numerous times in the ED for falls.  Today he was lethargic when his family member visited.  He has signs and symptoms of UTI.  Doubt urosepsis, metabolic instability or hemodynamic disorders including dehydration.  Patient is safe for discharge home.  He does not require hospitalization.  Patient's sister, his legal guardian, states that he is supposed to be wearing a helmet at all times.  Prescription sent with the patient for Keflex.  I encouraged the facility to keep his helmet on in case he has further falls.           Final Clinical Impression(s) / ED Diagnoses Final diagnoses:  Urinary tract infection without hematuria, site unspecified    Rx / DC Orders ED Discharge Orders          Ordered    cephALEXin (KEFLEX) 500 MG capsule  4 times daily        05/15/22 2033               [name], MD 05/15/22 2036

## 2022-05-15 NOTE — ED Notes (Signed)
[facility] at bedside for transport.  

## 2022-05-15 NOTE — ED Notes (Signed)
Patient attempting to get OOB.  Patient repositioned back in bed and given a Sprite to drink.

## 2022-05-15 NOTE — Discharge Instructions (Addendum)
The testing indicates that you have a urinary tract infection.  We are prescribing cephalexin to treat that.  This should be safe to take as you have tolerated medicines like this in the past.  Start taking the prescription tomorrow morning.  If needed, for fever, take Tylenol.  Try to drink plenty of fluids and eat a regular diet.  You should always be wearing your helmet, to prevent injuries from fall.

## 2022-05-15 NOTE — ED Triage Notes (Signed)
Pt bib by ems from [facility]. Pt family visited pt for the first time in 5 years according to [facility] staff.  Pt family reported that  pt was not normal and needed to be checked. Pt has no chief complaint.

## 2022-05-17 LAB — URINE CULTURE: Culture: >=100000 — AB

## 2022-05-18 ENCOUNTER — Telehealth: Payer: Self-pay | Admitting: Emergency Medicine
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
## 2022-05-18 NOTE — Telephone Encounter (Signed)
Post ED Visit - Positive Culture Follow-up: Successful Patient Follow-Up  Culture assessed and recommendations reviewed by:  []  , Pharm.D. []  Enzo Bi, Pharm.D., BCPS AQ-ID []  , Pharm.D., BCPS [x]  Celedonio Miyamoto, Pharm.D., BCPS []  [place], Garvin Fila.D., BCPS, AAHIVP []  , Pharm.D., BCPS, AAHIVP []  Georgina Pillion, PharmD, BCPS []  , PharmD, BCPS []  [place], PharmD, BCPS []  [place], PharmD  Positive urine culture  []  Patient discharged without antimicrobial prescription and treatment is now indicated [x]  Organism is resistant to prescribed ED discharge antimicrobial []  Patient with positive blood cultures  Changes discussed with ED provider: PA New antibiotic prescription stop keflex, symptom check, if still altered mental status fosfomycin 3gram po x 1 dose, is back to baseline no further treatment Spoke with Estella Husk ,caregiver @ [facility], states patient is back to baseline, provided order to stop keflex, order faxed to Lysle Pearl @ [phone]     Phillips Climes 05/18/2022, 11:28 AM

## 2022-05-23 ENCOUNTER — Other Ambulatory Visit: Payer: Self-pay

## 2022-05-23 ENCOUNTER — Emergency Department (HOSPITAL_COMMUNITY): Payer: Medicare Other

## 2022-05-23 ENCOUNTER — Emergency Department (HOSPITAL_COMMUNITY)
Admission: EM | Admit: 2022-05-23 | Discharge: 2022-05-25 | Disposition: A | Discharge status: Home / Self Care | Payer: Medicare Other | Attending: Emergency Medicine | Admitting: Emergency Medicine

## 2022-05-23 DIAGNOSIS — Z79899 Other long term (current) drug therapy: Secondary | ICD-10-CM | POA: Diagnosis not present

## 2022-05-23 DIAGNOSIS — Z91040 Latex allergy status: Secondary | ICD-10-CM | POA: Diagnosis not present

## 2022-05-23 DIAGNOSIS — W06XXXA Fall from bed, initial encounter: Secondary | ICD-10-CM | POA: Diagnosis not present

## 2022-05-23 DIAGNOSIS — W19XXXA Unspecified fall, initial encounter: Secondary | ICD-10-CM

## 2022-05-23 DIAGNOSIS — S0992XA Unspecified injury of nose, initial encounter: Secondary | ICD-10-CM | POA: Diagnosis present

## 2022-05-23 DIAGNOSIS — Z7982 Long term (current) use of aspirin: Secondary | ICD-10-CM | POA: Diagnosis not present

## 2022-05-23 DIAGNOSIS — E039 Hypothyroidism, unspecified: Secondary | ICD-10-CM | POA: Insufficient documentation

## 2022-05-23 DIAGNOSIS — F0390 Unspecified dementia without behavioral disturbance: Secondary | ICD-10-CM | POA: Diagnosis not present

## 2022-05-23 DIAGNOSIS — S0121XA Laceration without foreign body of nose, initial encounter: Secondary | ICD-10-CM | POA: Diagnosis not present

## 2022-05-23 LAB — CBC WITH DIFFERENTIAL/PLATELET
Abs Immature Granulocytes: 0.02 10*3/uL (ref 0.00–0.07)
Basophils Absolute: 0 10*3/uL (ref 0.0–0.1)
Basophils Relative: 0 %
Eosinophils Absolute: 0.1 10*3/uL (ref 0.0–0.5)
Eosinophils Relative: 2 %
HCT: 40.2 % (ref 39.0–52.0)
Hemoglobin: 13.1 g/dL (ref 13.0–17.0)
Immature Granulocytes: 0 %
Lymphocytes Relative: 24 %
Lymphs Abs: 1.7 10*3/uL (ref 0.7–4.0)
MCH: 30.1 pg (ref 26.0–34.0)
MCHC: 32.6 g/dL (ref 30.0–36.0)
MCV: 92.4 fL (ref 80.0–100.0)
Monocytes Absolute: 0.6 10*3/uL (ref 0.1–1.0)
Monocytes Relative: 9 %
Neutro Abs: 4.4 10*3/uL (ref 1.7–7.7)
Neutrophils Relative %: 65 %
Platelets: 147 10*3/uL — ABNORMAL LOW (ref 150–400)
RBC: 4.35 MIL/uL (ref 4.22–5.81)
RDW: 13.9 % (ref 11.5–15.5)
WBC: 6.8 10*3/uL (ref 4.0–10.5)
nRBC: 0 % (ref 0.0–0.2)

## 2022-05-23 LAB — URINALYSIS, ROUTINE W REFLEX MICROSCOPIC
Bacteria, UA: NONE SEEN
Bilirubin Urine: NEGATIVE
Glucose, UA: NEGATIVE mg/dL
Ketones, ur: 5 mg/dL — AB
Leukocytes,Ua: NEGATIVE
Nitrite: NEGATIVE
Protein, ur: NEGATIVE mg/dL
RBC / HPF: >50 RBC/hpf — ABNORMAL HIGH (ref 0–5)
Specific Gravity, Urine: 1.017 (ref 1.005–1.030)
pH: 8 (ref 5.0–8.0)

## 2022-05-23 LAB — BASIC METABOLIC PANEL
Anion gap: 6 (ref 5–15)
BUN: 28 mg/dL — ABNORMAL HIGH (ref 6–20)
CO2: 28 mmol/L (ref 22–32)
Calcium: 8.9 mg/dL (ref 8.9–10.3)
Chloride: 105 mmol/L (ref 98–111)
Creatinine, Ser: 1.01 mg/dL (ref 0.61–1.24)
GFR, Estimated: >60 mL/min (ref >60)
Glucose, Bld: 74 mg/dL (ref 70–99)
Potassium: 5 mmol/L (ref 3.5–5.1)
Sodium: 139 mmol/L (ref 135–145)

## 2022-05-23 MED ORDER — LEVOTHYROXINE SODIUM 25 MCG PO TABS
25.0000 ug | ORAL_TABLET | Freq: Every day | ORAL | Status: DC
  Administered 2022-05-24 – 2022-05-25 (×2): 25 ug via ORAL
  Filled 2022-05-23 (×2): qty 1

## 2022-05-23 MED ORDER — POLYETHYLENE GLYCOL 3350 17 G PO PACK
17.0000 g | PACK | Freq: Two times a day (BID) | ORAL | Status: DC
  Administered 2022-05-24 – 2022-05-25 (×2): 17 g via ORAL
  Filled 2022-05-23 (×4): qty 1

## 2022-05-23 MED ORDER — MIDAZOLAM HCL 2 MG/2ML IJ SOLN
2.0000 mg | Freq: Once | INTRAMUSCULAR | Status: AC
  Administered 2022-05-23: 2 mg via INTRAVENOUS
  Filled 2022-05-23: qty 2

## 2022-05-23 MED ORDER — HALOPERIDOL 5 MG PO TABS
5.0000 mg | ORAL_TABLET | Freq: Two times a day (BID) | ORAL | Status: DC
Start: 2022-05-23 — End: 2022-05-26
  Administered 2022-05-24 – 2022-05-25 (×2): 5 mg via ORAL
  Filled 2022-05-23 (×4): qty 1

## 2022-05-23 MED ORDER — ALBUTEROL SULFATE (2.5 MG/3ML) 0.083% IN NEBU: 2.5000 mg | INHALATION_SOLUTION | Freq: Four times a day (QID) | RESPIRATORY_TRACT | Status: DC | PRN

## 2022-05-23 MED ORDER — CARBOXYMETH-GLYCERIN-POLYSORB 0.5-1-0.5 % OP SOLN: 1.0000 [drp] | Freq: Three times a day (TID) | OPHTHALMIC | Status: DC

## 2022-05-23 MED ORDER — DROPERIDOL 2.5 MG/ML IJ SOLN
5.0000 mg | Freq: Once | INTRAMUSCULAR | Status: AC
  Administered 2022-05-23: 5 mg via INTRAVENOUS
  Filled 2022-05-23: qty 2

## 2022-05-23 MED ORDER — MAGNESIUM HYDROXIDE 400 MG/5ML PO SUSP: 5.0000 mL | Freq: Every day | ORAL | Status: DC | PRN

## 2022-05-23 MED ORDER — CEPHALEXIN 500 MG PO CAPS
500.0000 mg | ORAL_CAPSULE | Freq: Four times a day (QID) | ORAL | Status: DC
  Administered 2022-05-23: 500 mg via ORAL
  Filled 2022-05-23: qty 1

## 2022-05-23 MED ORDER — ACETAMINOPHEN 325 MG PO TABS
650.0000 mg | ORAL_TABLET | ORAL | Status: DC | PRN
  Administered 2022-05-24: 650 mg via ORAL
  Filled 2022-05-23: qty 2

## 2022-05-23 MED ORDER — SERTRALINE HCL 50 MG PO TABS
75.0000 mg | ORAL_TABLET | Freq: Every morning | ORAL | Status: DC
  Administered 2022-05-24 – 2022-05-25 (×2): 75 mg via ORAL
  Filled 2022-05-23 (×2): qty 2

## 2022-05-23 MED ORDER — ASPIRIN 81 MG PO CHEW
81.0000 mg | CHEWABLE_TABLET | Freq: Every morning | ORAL | Status: DC
  Administered 2022-05-24 – 2022-05-25 (×2): 81 mg via ORAL
  Filled 2022-05-23 (×2): qty 1

## 2022-05-23 MED ORDER — CLONAZEPAM 0.5 MG PO TABS
0.5000 mg | ORAL_TABLET | Freq: Two times a day (BID) | ORAL | Status: DC
  Administered 2022-05-24 – 2022-05-25 (×3): 0.5 mg via ORAL
  Filled 2022-05-23 (×4): qty 1

## 2022-05-23 MED ORDER — DOCUSATE SODIUM 100 MG PO CAPS
100.0000 mg | ORAL_CAPSULE | Freq: Every morning | ORAL | Status: DC
  Administered 2022-05-24 – 2022-05-25 (×2): 100 mg via ORAL
  Filled 2022-05-23 (×2): qty 1

## 2022-05-23 MED ORDER — POLYVINYL ALCOHOL 1.4 % OP SOLN
1.0000 [drp] | Freq: Three times a day (TID) | OPHTHALMIC | Status: DC
  Administered 2022-05-24 – 2022-05-25 (×4): 1 [drp] via OPHTHALMIC
  Filled 2022-05-23: qty 15

## 2022-05-23 MED ORDER — HALOPERIDOL LACTATE 5 MG/ML IJ SOLN
5.0000 mg | Freq: Once | INTRAMUSCULAR | Status: DC
  Filled 2022-05-23: qty 1

## 2022-05-23 MED ORDER — TRAMADOL HCL 50 MG PO TABS
50.0000 mg | ORAL_TABLET | Freq: Four times a day (QID) | ORAL | Status: DC | PRN
  Administered 2022-05-24 (×2): 50 mg via ORAL
  Filled 2022-05-23 (×3): qty 1

## 2022-05-23 MED ORDER — SENNA 8.6 MG PO TABS
2.0000 | ORAL_TABLET | Freq: Two times a day (BID) | ORAL | Status: DC
  Administered 2022-05-24 – 2022-05-25 (×3): 17.2 mg via ORAL
  Filled 2022-05-23 (×4): qty 2

## 2022-05-23 MED ORDER — HALOPERIDOL LACTATE 5 MG/ML IJ SOLN
5.0000 mg | Freq: Four times a day (QID) | INTRAMUSCULAR | Status: DC | PRN
  Administered 2022-05-23 – 2022-05-24 (×2): 5 mg via INTRAVENOUS
  Filled 2022-05-23: qty 1

## 2022-05-23 MED ORDER — LEVOTHYROXINE SODIUM 100 MCG PO TABS
200.0000 ug | ORAL_TABLET | Freq: Every day | ORAL | Status: DC
  Administered 2022-05-24 – 2022-05-25 (×2): 200 ug via ORAL
  Filled 2022-05-23 (×2): qty 2

## 2022-05-23 MED ORDER — PANTOPRAZOLE SODIUM 40 MG PO TBEC
40.0000 mg | DELAYED_RELEASE_TABLET | Freq: Every morning | ORAL | Status: DC
  Administered 2022-05-24 – 2022-05-25 (×2): 40 mg via ORAL
  Filled 2022-05-23 (×2): qty 1

## 2022-05-23 MED ORDER — DIVALPROEX SODIUM 125 MG PO CSDR
500.0000 mg | DELAYED_RELEASE_CAPSULE | Freq: Three times a day (TID) | ORAL | Status: DC
  Administered 2022-05-24 – 2022-05-25 (×5): 500 mg via ORAL
  Filled 2022-05-23 (×6): qty 4

## 2022-05-23 MED ORDER — HALOPERIDOL LACTATE 5 MG/ML IJ SOLN
2.0000 mg | Freq: Four times a day (QID) | INTRAMUSCULAR | Status: DC | PRN
  Administered 2022-05-23: 2 mg via INTRAVENOUS
  Filled 2022-05-23: qty 1

## 2022-05-23 MED ORDER — DIPHENHYDRAMINE HCL 50 MG/ML IJ SOLN
25.0000 mg | Freq: Once | INTRAMUSCULAR | Status: AC
  Administered 2022-05-23: 25 mg via INTRAVENOUS
  Filled 2022-05-23: qty 1
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
## 2022-05-23 NOTE — ED Notes (Addendum)
Attempted to in/out cath patient, catheter would not pass. PA notified.

## 2022-05-23 NOTE — ED Notes (Addendum)
Pt's alarm rang, this RN to bedside. Pt found in floor.  Pt assisted back to bed by staff x 2.  PA to bedside.  Pt continuing to pull off all wires and attempting to get out of bed despite redirection.  Pt not following directions.  Restrains placed, orders received.  Pt continuing to yell at staff.
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
## 2022-05-23 NOTE — ED Notes (Signed)
Spoke w/pt's legal guardian, Shiela Mayer, [phone]

## 2022-05-24 DIAGNOSIS — S0121XA Laceration without foreign body of nose, initial encounter: Secondary | ICD-10-CM | POA: Diagnosis not present

## 2022-05-24 MED ORDER — HALOPERIDOL LACTATE 5 MG/ML IJ SOLN
5.0000 mg | Freq: Once | INTRAMUSCULAR | Status: AC
  Administered 2022-05-24: 5 mg via INTRAVENOUS
  Filled 2022-05-24: qty 1

## 2022-05-24 NOTE — ED Notes (Signed)
Ordered a low bed and floor mats for patient.

## 2022-05-24 NOTE — ED Notes (Signed)
Received report from Crystal RN.
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
## 2022-05-24 NOTE — ED Notes (Signed)
At 7:05 AM, pt soft restraints were removed, and pt mattress moved to the floor. At 2 PM, pt crawled off the mattress and to the door 3 times. Multiple staff assisted pt back to his mattress and provided warm blankets. Pt brief and bed linens were changed. I fed pt breakfast, lunch, and dinner. Pt fed himself a sandwich at lunch. Throughout the day, pt alternated between calm/cooperative and exaggerated irritability. I crushed pt medications and administered them in apple sauce.

## 2022-05-24 NOTE — ED Notes (Signed)
Pt currently awake, much calmer than earlier in the shift, no outbursts.
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
## 2022-05-24 NOTE — Progress Notes (Signed)
Transition of Care [facility]) - Emergency Department Mini Assessment   Patient Details  Name: [name] MRN: [id number] Date of Birth: 09-22-1966  Transition of Care [facility]) CM/SW Contact:    Lossie Faes Strickland, LCSWA Phone Number: 05/24/2022, 12:03 PM   Clinical Narrative: [facility] CSW consulted with pts legal guardian/sister, [name].  [name] is stating that she has requested that [name] find her brother another facility and they are not accommodating her request.  She also stated that upon request of his medical record to assist with her finding placement, [name] stated that would cost $800 to obtain pts medical record.    [name] currently lives in [place], [place].  [name] will be contacting [name] to assist with placement of pt.  [name], MSW, LCSW-A Pronouns:  She/Her/Hers [facility] Clinical Social Worker Direct Number:  [phone] [email or web address]    ED Mini Assessment: What brought you to the Emergency Department? : Fall  Barriers to Discharge: No Barriers Identified     Means of departure: Ambulance  Interventions which prevented an admission or readmission: SNF Placement    Patient Contact and Communications Key Contact 1: [name]   Spoke with: [name] Phone Number: [phone] Call outcome: [name] is actively seeking another placement for pt.  She has also asked [name] to assist her with another placement.  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 01/26/2018 01/26/2018   Bedridden 01/26/2018    Quadriplegia (HCC)    Abdominal wall abscess 01/25/2018   Fracture of humerus anatomical neck, right, closed, initial encounter 10/03/2017   AKI (acute kidney injury) (HCC) 10/03/2017   UTI (urinary tract infection) 10/03/2017   Agitation 07/03/2014   Cardiomyopathy, ischemic 06/18/2014   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   Acute psychosis (HCC) 04/19/2014   Unspecified hypothyroidism 05/16/2013   Constipation 05/16/2013   Anemia of other chronic disease 05/16/2013   Mental disorder 01/03/2013   Addisons disease (HCC) 01/03/2013   PEG (percutaneous endoscopic gastrostomy) adjustment/replacement/removal (HCC) 12/21/2011   Hypokalemia 11/25/2011   Thrombocytopenia (HCC) 11/25/2011   FTT (failure to thrive) in adult 11/25/2011   Grand mal seizure (HCC) 11/23/2011   Depression (emotion) 11/23/2011   History of atrial fibrillation 11/23/2011   Other specified hemorrhagic conditions (HCC) 11/23/2011   Glaucoma 11/23/2011   Hypoglycemia 11/23/2011   Chronic constipation 11/23/2011   Anoxic brain injury (HCC) 11/12/2011   Protein calorie malnutrition (HCC) 11/12/2011   Hyperchloremia 11/12/2011   Hyperthyroidism 11/12/2011   Acute lower UTI 11/12/2011   Dysphagia    PCP:  Georgann Housekeeper, MD Pharmacy:  No Pharmacies Listed
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
## 2022-05-24 NOTE — ED Notes (Signed)
Pt placed in a low bed with mats at bedside. Sitter remains w/pt.

## 2022-05-24 NOTE — ED Notes (Signed)
Pt currently sleeping, respirations equal & unlabored.  ?

## 2022-05-25 DIAGNOSIS — S0121XA Laceration without foreign body of nose, initial encounter: Secondary | ICD-10-CM | POA: Diagnosis not present
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
## 2022-05-25 NOTE — ED Notes (Signed)
[facility] called for transport.  
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
## 2022-05-25 NOTE — ED Notes (Addendum)
All medications given crushed up in apple sauce. Compliant w/ med admin. Pt ate whole container of apple sauce.
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
## 2022-05-31 IMAGING — CT CT CERVICAL SPINE W/O CM
3 of 4 series · 11 of 33 positions shown, 13 images · non-contrast
Comparison: 03/27/2022

CLINICAL DATA: Unwitnessed fall with head, face, and neck trauma

Facial abrasion and hematoma
EXAM:
CT HEAD WITHOUT CONTRAST
CT MAXILLOFACIAL WITHOUT CONTRAST
CT CERVICAL SPINE WITHOUT CONTRAST
TECHNIQUE: Multidetector CT imaging of the head, cervical spine, and
maxillofacial structures were performed using the standard protocol
without intravenous contrast. Multiplanar CT image reconstructions
of the cervical spine and maxillofacial structures were also
generated.
RADIATION DOSE REDUCTION: This exam was performed according to the
departmental dose-optimization program which includes automated
exposure control, adjustment of the mA and/or kV according to
patient size and/or use of iterative reconstruction technique.

[Series 7: c_spine 2.0 3 st · axial · 0.28mm/px · z∈[-260,-132]mm · 3 of 96 slices shown, 4 images]
[im 16/96  soft-tissue]
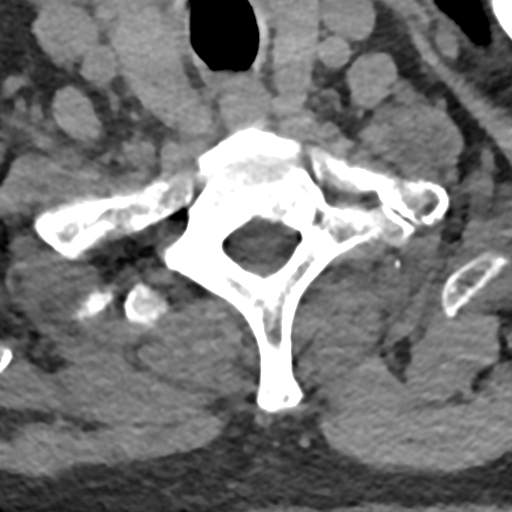
[im 16/96  bone]
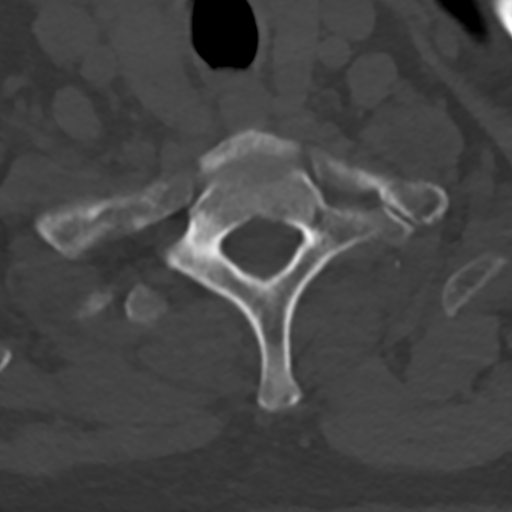
[im 48/96  bone]
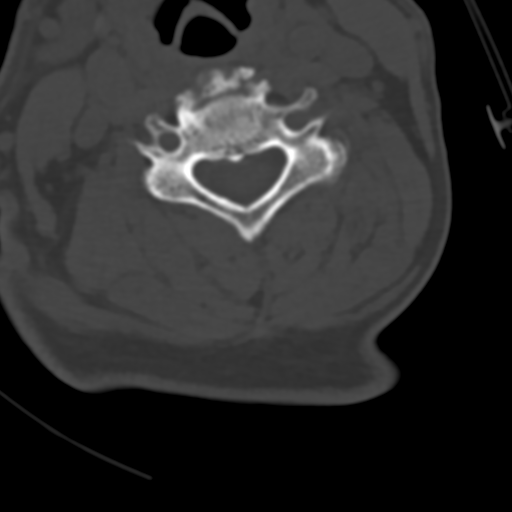
[im 80/96  bone]
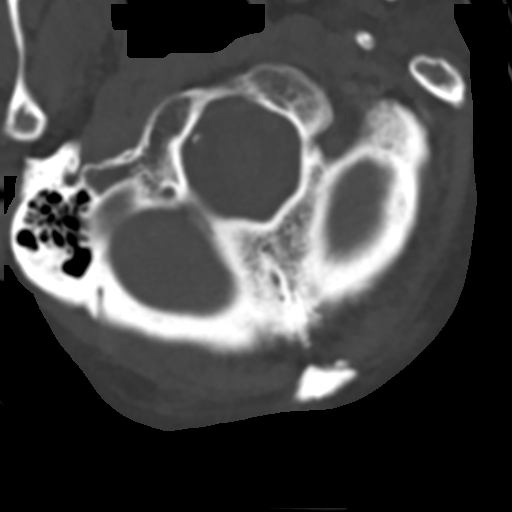

[Series 10: coronal bone · coronal · 0.23mm/px · 3 of 61 slices shown]
[im 13/61  bone]
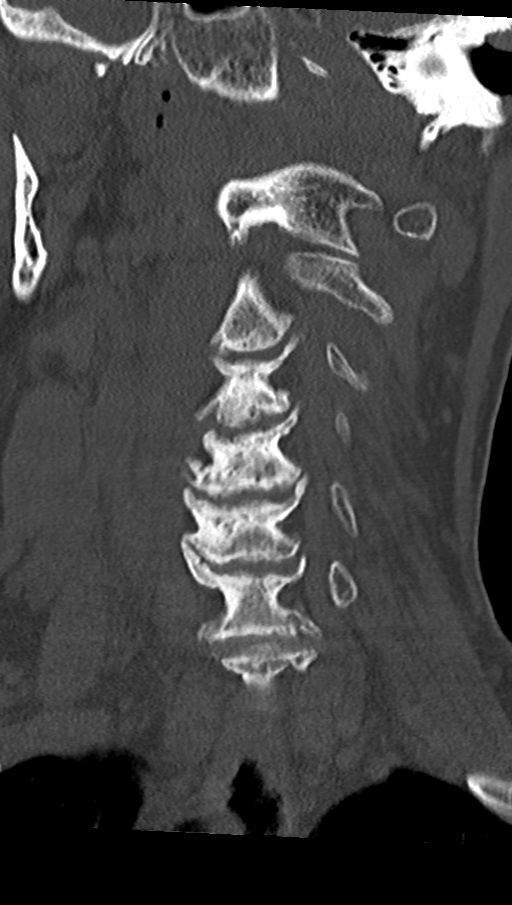
[im 25/61  bone]
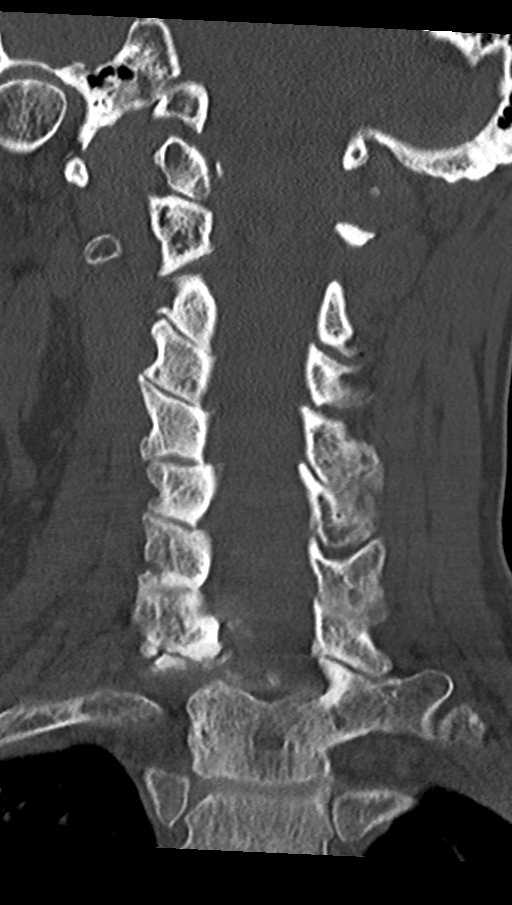
[im 37/61  bone]
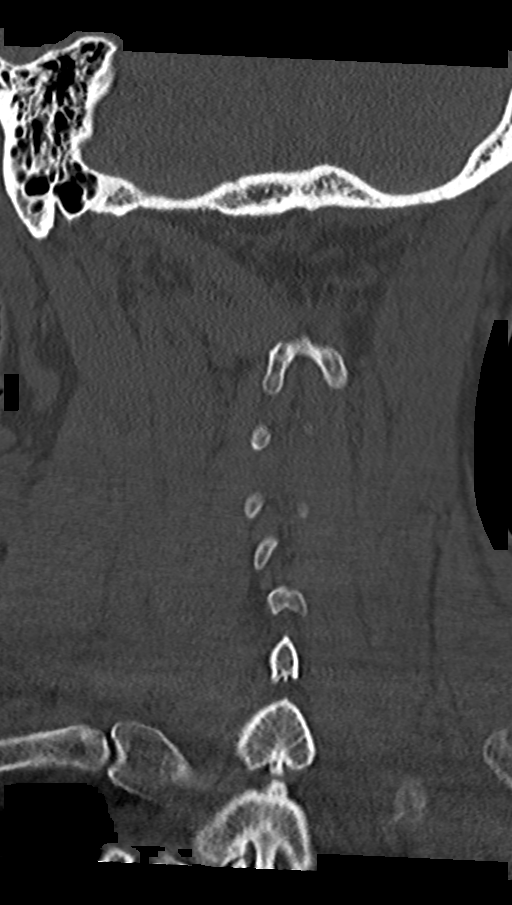

[Series 11: sagittal bone · sagittal · 0.23mm/px · 5 of 61 slices shown, 6 images]
[im 21/61  bone]
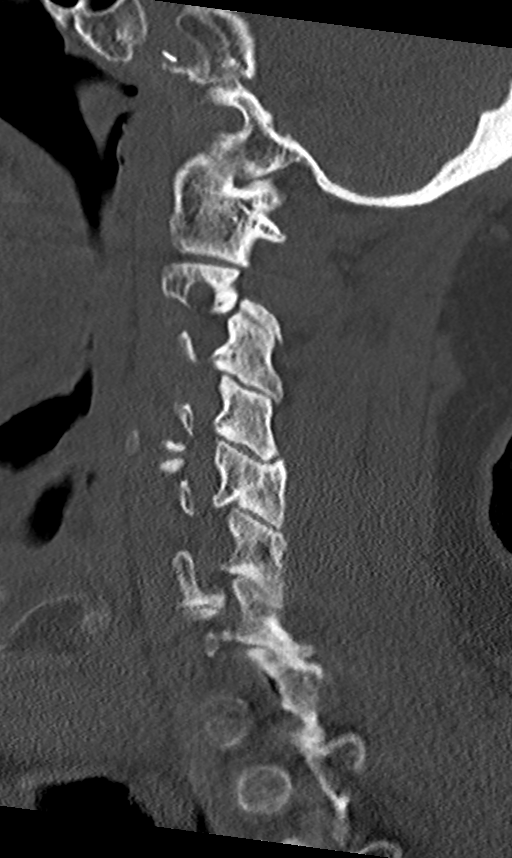
[im 26/61  bone]
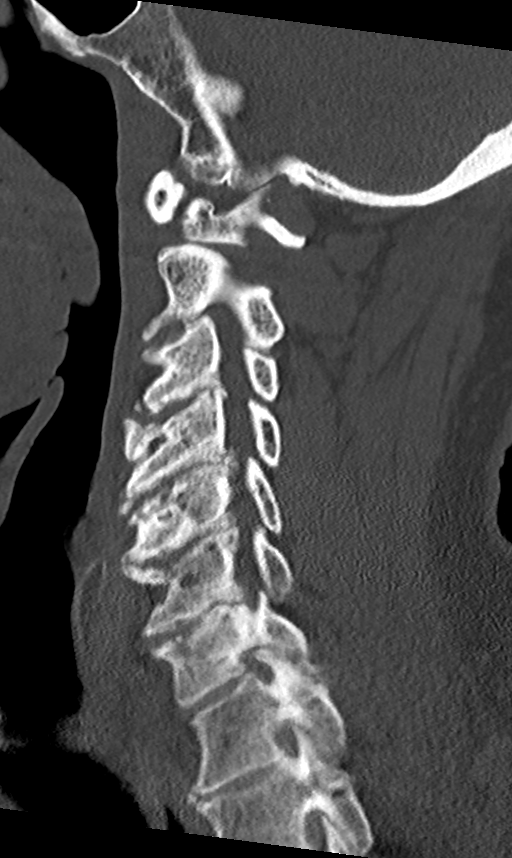
[im 31/61  soft-tissue]
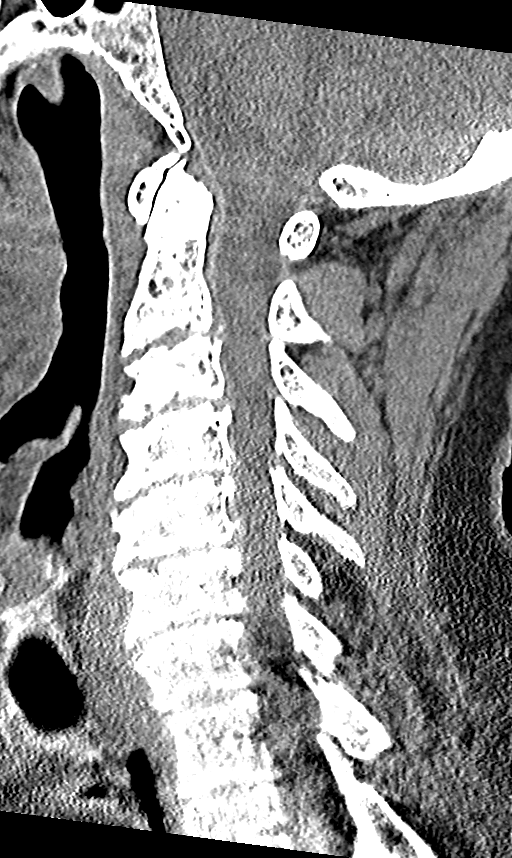
[im 31/61  bone]
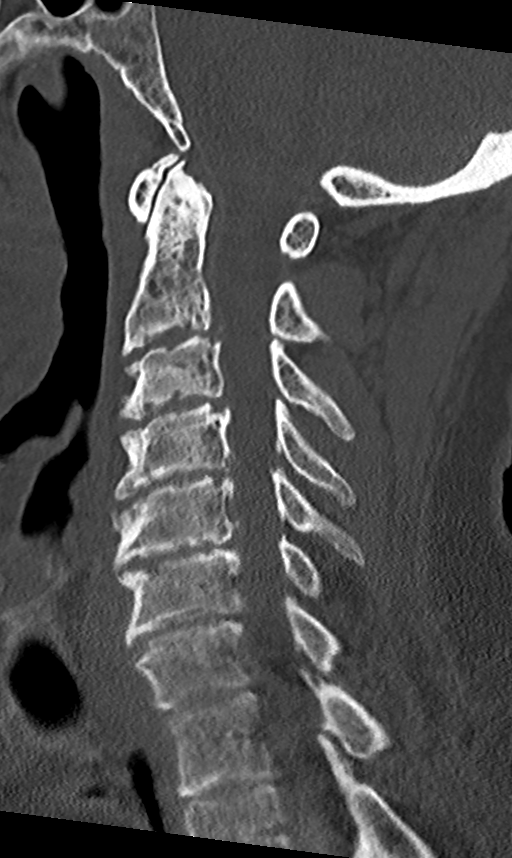
[im 36/61  bone]
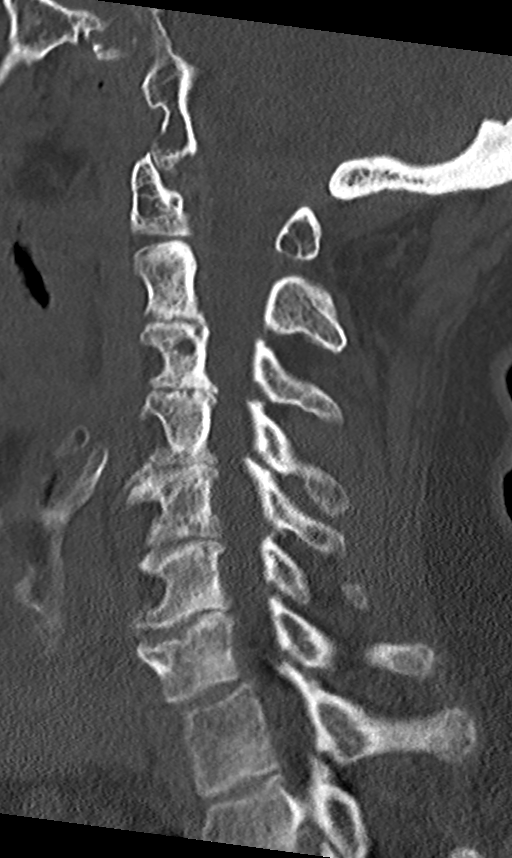
[im 41/61  bone]
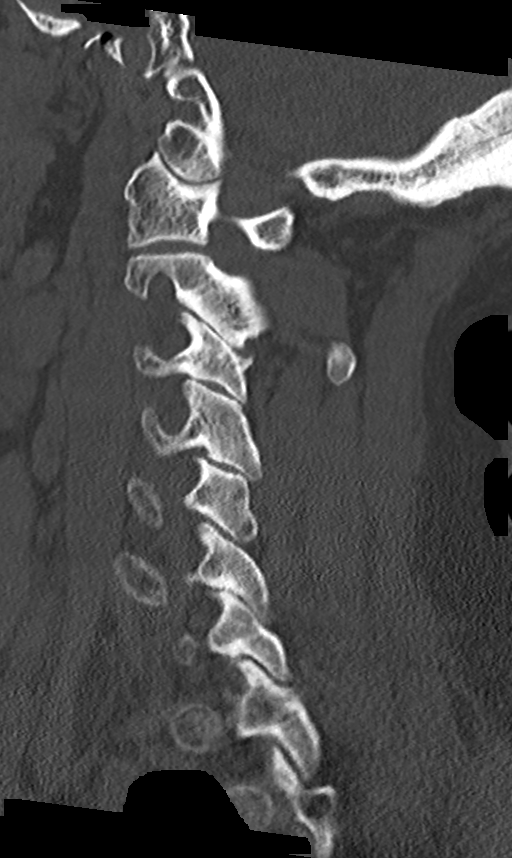

[11 of 33 positions shown; findings below may reference images not displayed]

FINDINGS: CT HEAD FINDINGS

Brain: No evidence of acute infarction, hemorrhage, hydrocephalus,
extra-axial collection or mass lesion/mass effect.

Vascular: No hyperdense vessel or unexpected calcification.

Skull: Normal. Negative for fracture or focal lesion.

Other: Mild diffuse right scalp hematoma.

CT MAXILLOFACIAL FINDINGS

Osseous: Chronic deformity of the nasal bone consistent with remote
fracture.

Orbits: Negative. No traumatic or inflammatory finding.

Sinuses: Large nasal septal perforation is present, best seen on
image 22 of series 9.

Soft tissues: Negative.

CT CERVICAL SPINE FINDINGS

Alignment: Within normal limits.

Skull base and vertebrae: No acute fracture. No primary bone lesion
or focal pathologic process.

Soft tissues and spinal canal: No prevertebral fluid or swelling. No
visible canal hematoma.

Disc levels: Advanced degenerative changes seen throughout the
cervical spine.

Upper chest: Layering debris noted within the trachea. Visualized
lung apices are clear.

Other: None.
IMPRESSION: 1. No acute intracranial abnormality.
2. No acute fracture or dislocation of the cervical or visualized
upper thoracic spine.
3. No acute facial bone fracture.
4. Unchanged large nasal septal perforation.

## 2022-05-31 IMAGING — CT CT MAXILLOFACIAL W/O CM
4 series · 16 of 47 positions shown, 18 images · non-contrast
Comparison: 03/27/2022

CLINICAL DATA: Unwitnessed fall with head, face, and neck trauma

Facial abrasion and hematoma
EXAM:
CT HEAD WITHOUT CONTRAST
CT MAXILLOFACIAL WITHOUT CONTRAST
CT CERVICAL SPINE WITHOUT CONTRAST
TECHNIQUE: Multidetector CT imaging of the head, cervical spine, and
maxillofacial structures were performed using the standard protocol
without intravenous contrast. Multiplanar CT image reconstructions
of the cervical spine and maxillofacial structures were also
generated.
RADIATION DOSE REDUCTION: This exam was performed according to the
departmental dose-optimization program which includes automated
exposure control, adjustment of the mA and/or kV according to
patient size and/or use of iterative reconstruction technique.

[Series 3: facial/ orbits 2.0 h30s · axial · 0.38mm/px · z∈[-188,-54]mm · 8 of 87 slices shown, 10 images]
[im 10/87  brain]
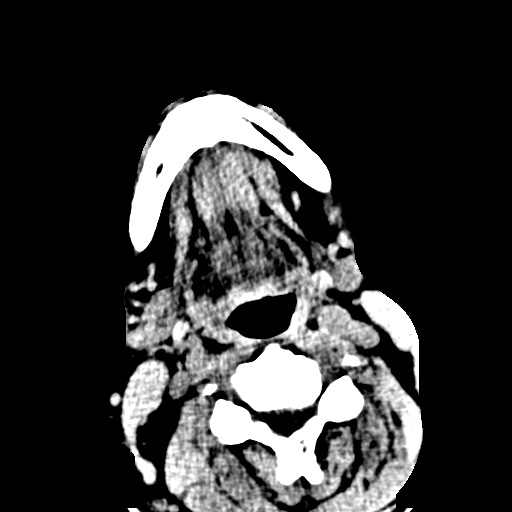
[im 10/87  bone]
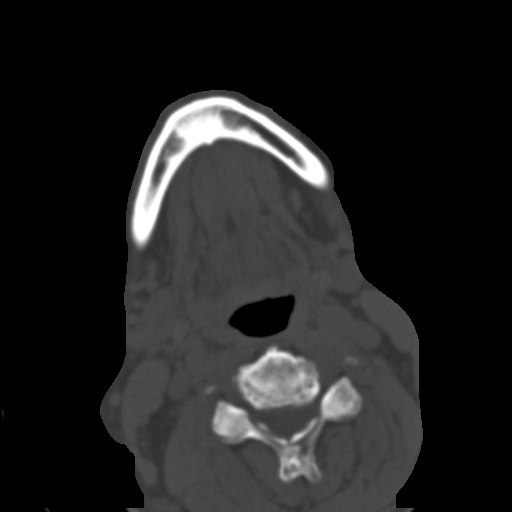
[im 20/87  bone]
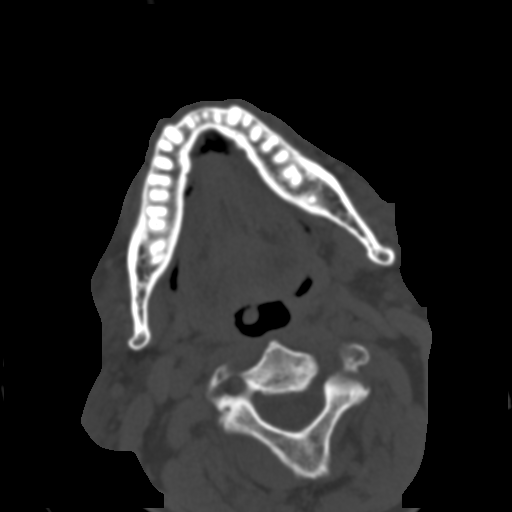
[im 29/87  bone]
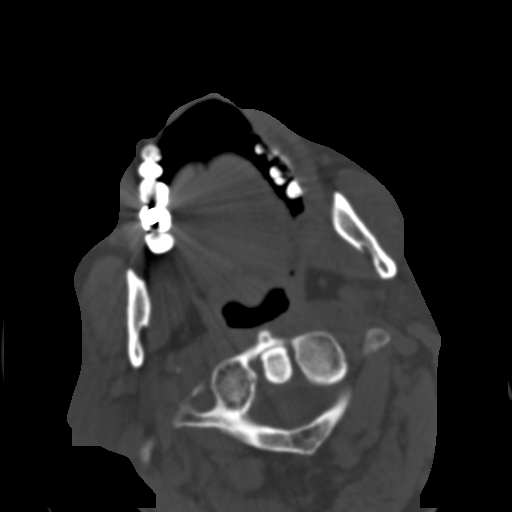
[im 39/87  bone]
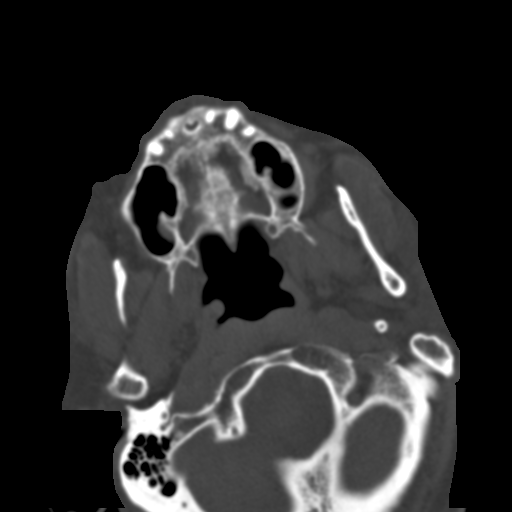
[im 48/87  brain]
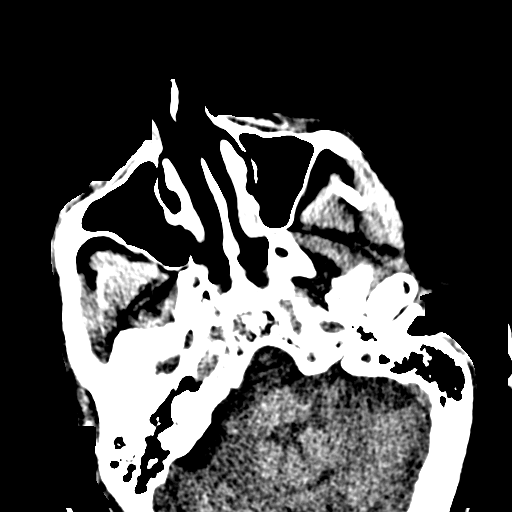
[im 48/87  bone]
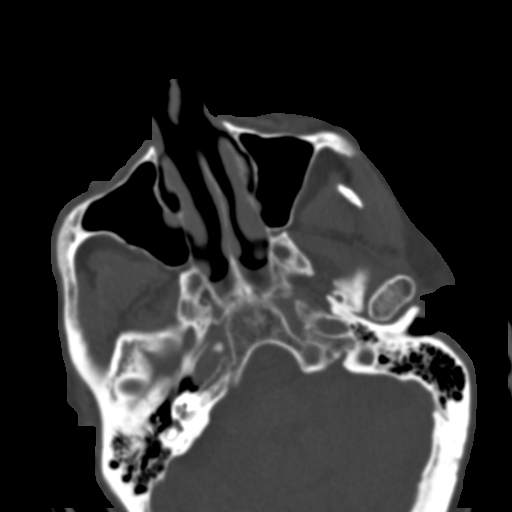
[im 58/87  bone]
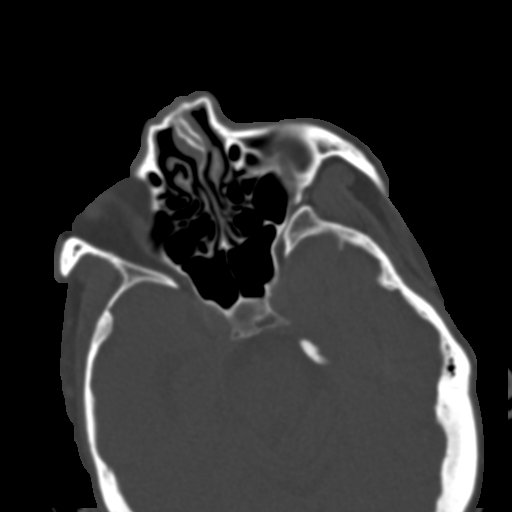
[im 67/87  bone]
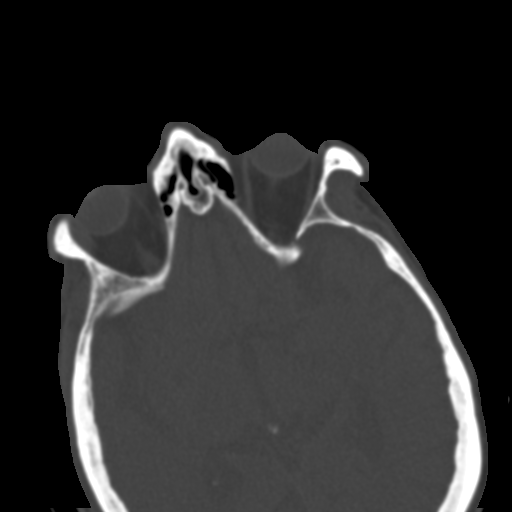
[im 77/87  bone]
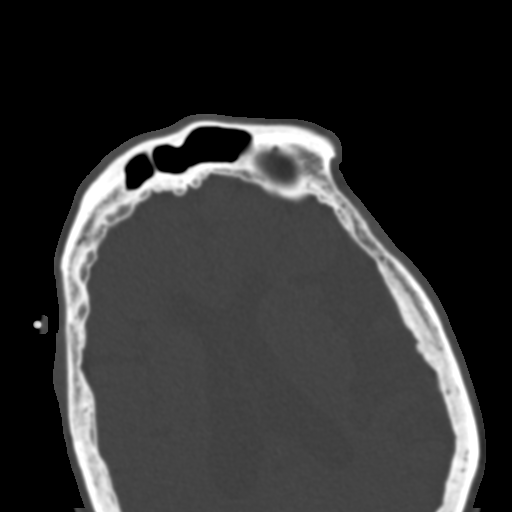

[Series 5: 1.0 thin soft tissue · axial · 0.38mm/px · z∈[-188,-170]mm · 2 of 173 slices shown]
[im 19/173  brain]
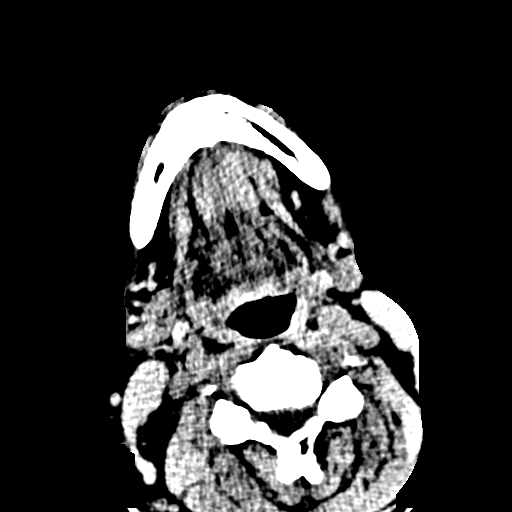
[im 37/173  brain]
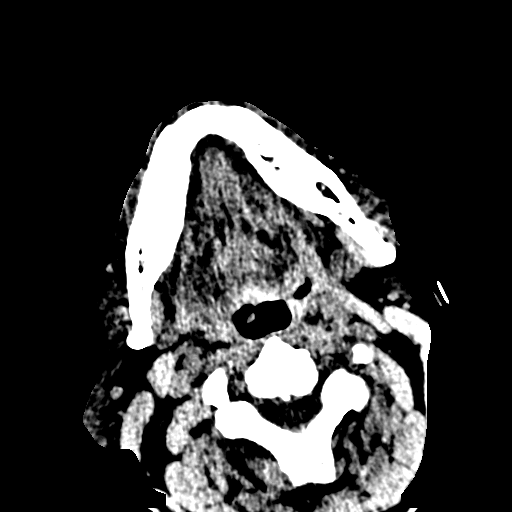

[Series 7: coronal soft tissue · coronal · 0.38mm/px · 3 of 91 slices shown]
[im 31/91  bone]
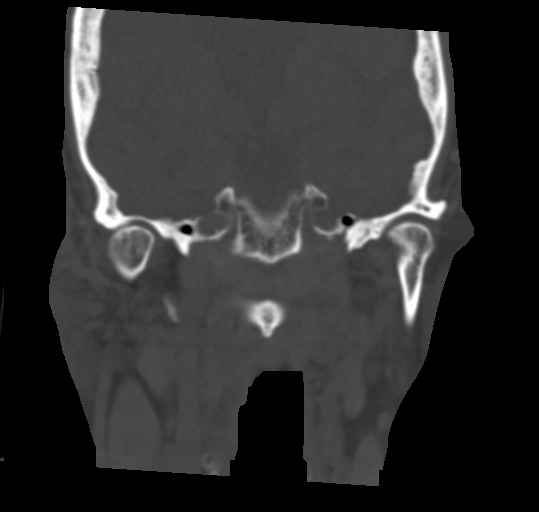
[im 41/91  bone]
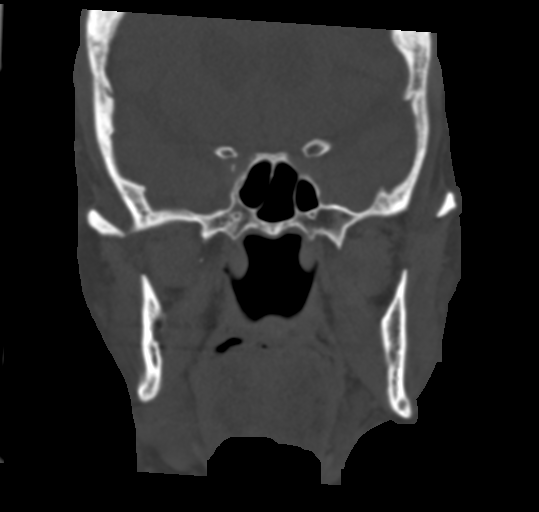
[im 51/91  bone]
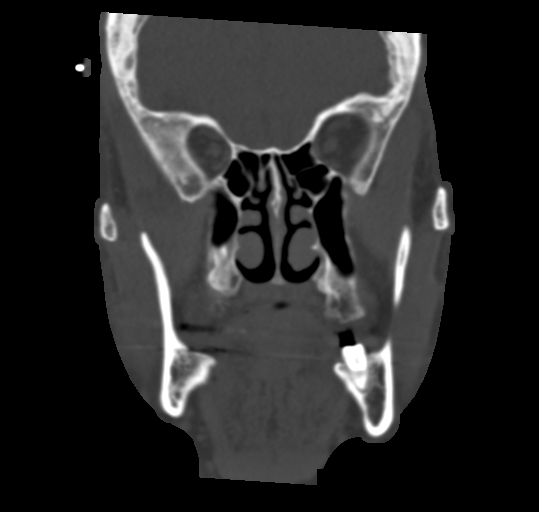

[Series 8: sagittal soft tissue · sagittal · 0.37mm/px · 3 of 101 slices shown]
[im 34/101  bone]
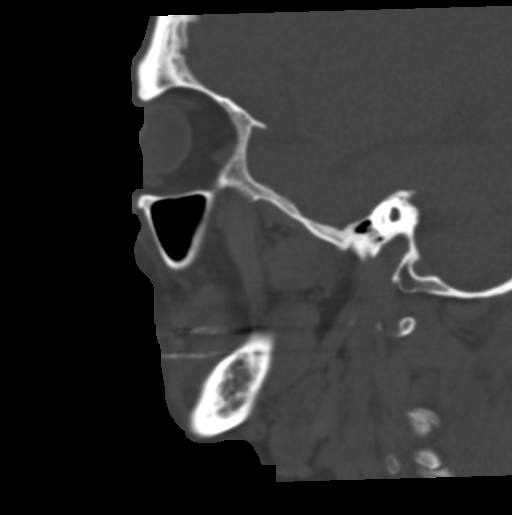
[im 51/101  bone]
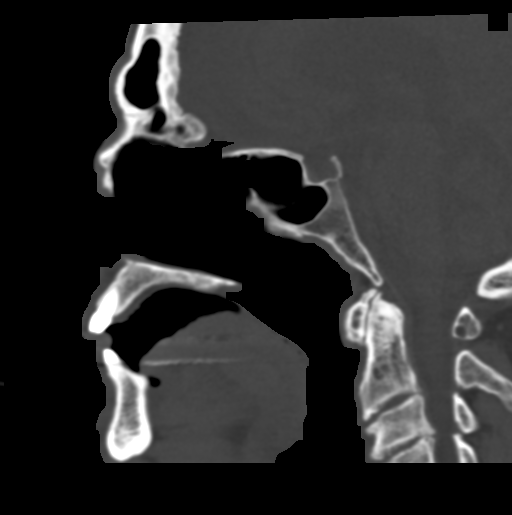
[im 67/101  bone]
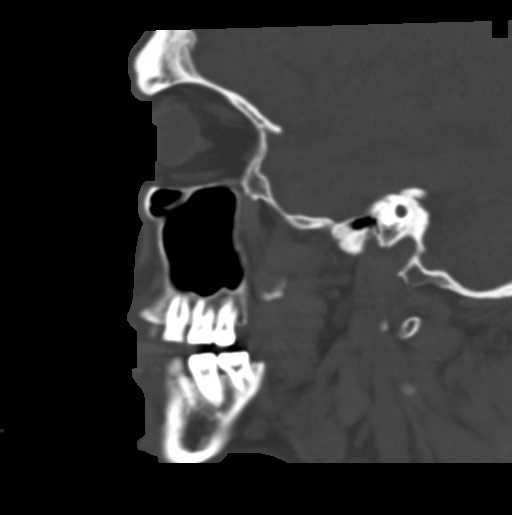

[16 of 47 positions shown; findings below may reference images not displayed]

FINDINGS: CT HEAD FINDINGS

Brain: No evidence of acute infarction, hemorrhage, hydrocephalus,
extra-axial collection or mass lesion/mass effect.

Vascular: No hyperdense vessel or unexpected calcification.

Skull: Normal. Negative for fracture or focal lesion.

Other: Mild diffuse right scalp hematoma.

CT MAXILLOFACIAL FINDINGS

Osseous: Chronic deformity of the nasal bone consistent with remote
fracture.

Orbits: Negative. No traumatic or inflammatory finding.

Sinuses: Large nasal septal perforation is present, best seen on
image 22 of series 9.

Soft tissues: Negative.

CT CERVICAL SPINE FINDINGS

Alignment: Within normal limits.

Skull base and vertebrae: No acute fracture. No primary bone lesion
or focal pathologic process.

Soft tissues and spinal canal: No prevertebral fluid or swelling. No
visible canal hematoma.

Disc levels: Advanced degenerative changes seen throughout the
cervical spine.

Upper chest: Layering debris noted within the trachea. Visualized
lung apices are clear.

Other: None.
IMPRESSION: 1. No acute intracranial abnormality.
2. No acute fracture or dislocation of the cervical or visualized
upper thoracic spine.
3. No acute facial bone fracture.
4. Unchanged large nasal septal perforation.

## 2022-06-04 IMAGING — CT CT MAXILLOFACIAL W/O CM
3 of 4 series · 14 of 47 positions shown, 17 images · non-contrast
Comparison: 04/28/2022 and prior CTs

CLINICAL DATA: 55-year-old male with head neck and facial pain
following fall. Initial encounter.



[Series 3: max soft · axial · 0.48mm/px · z∈[+1216,+1370]mm · 9 of 91 slices shown, 12 images]
[im 7/91  brain]
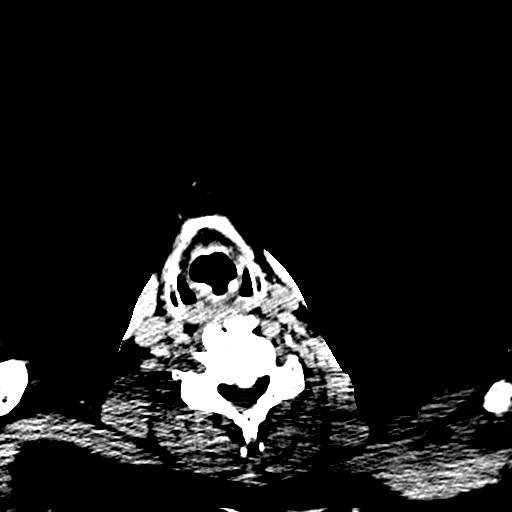
[im 7/91  bone]
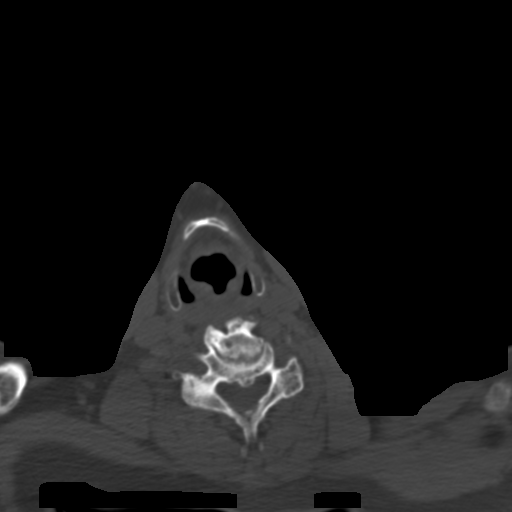
[im 16/91  bone]
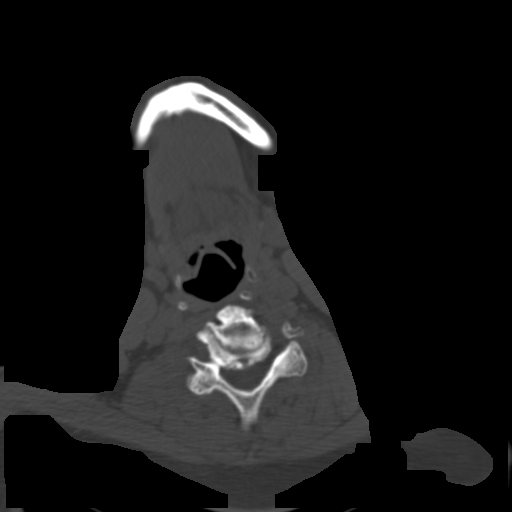
[im 25/91  bone]
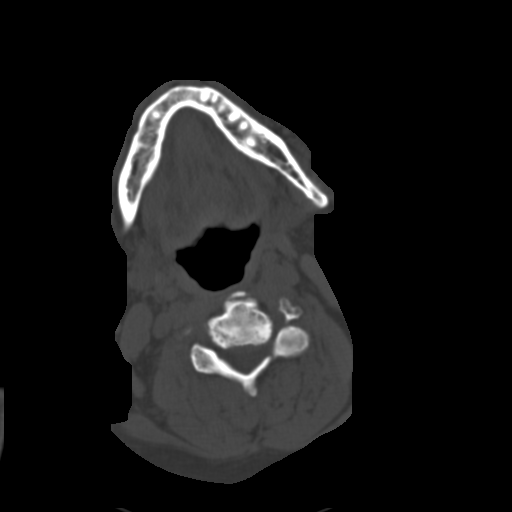
[im 35/91  bone]
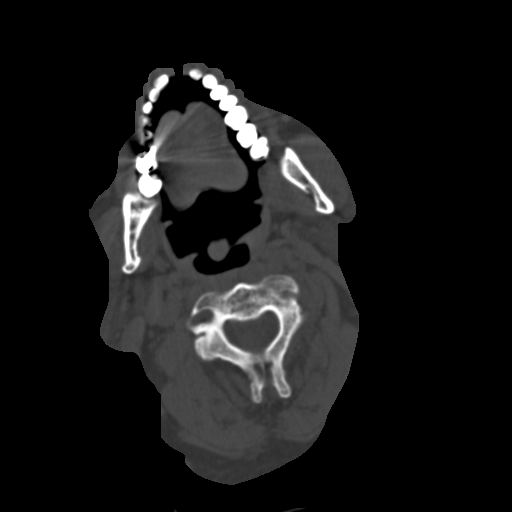
[im 47/91  brain]
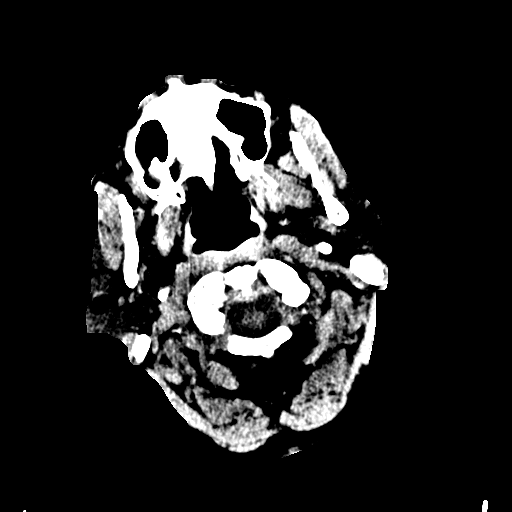
[im 47/91  bone]
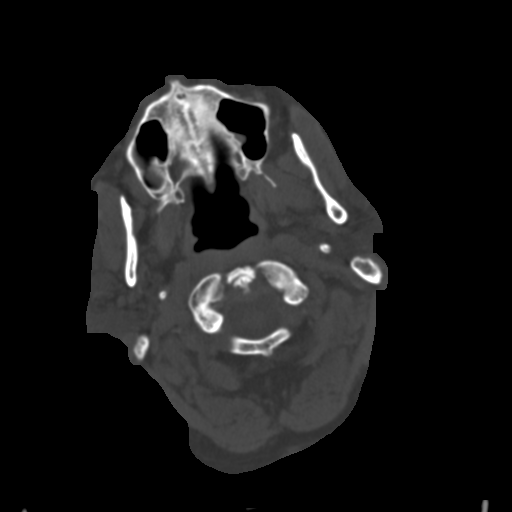
[im 56/91  bone]
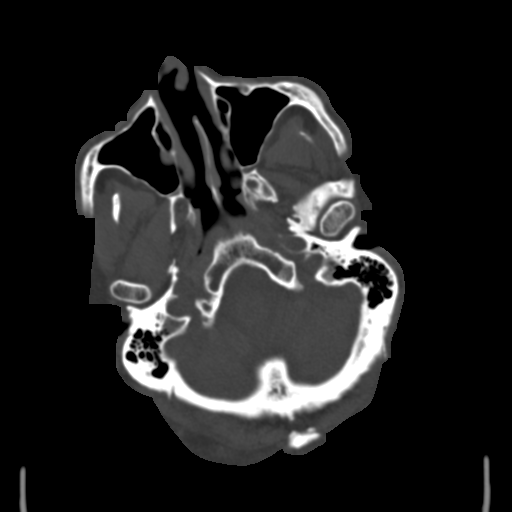
[im 66/91  bone]
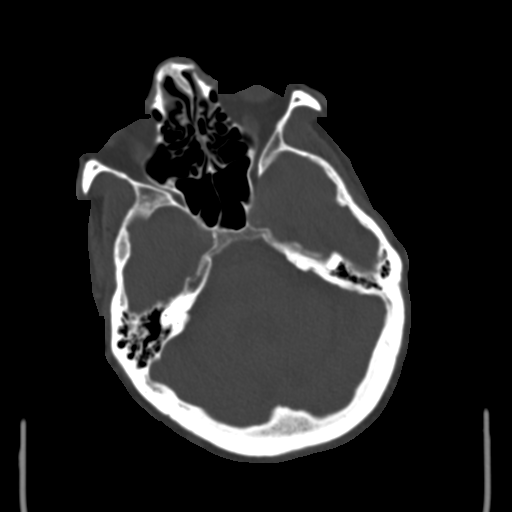
[im 75/91  bone]
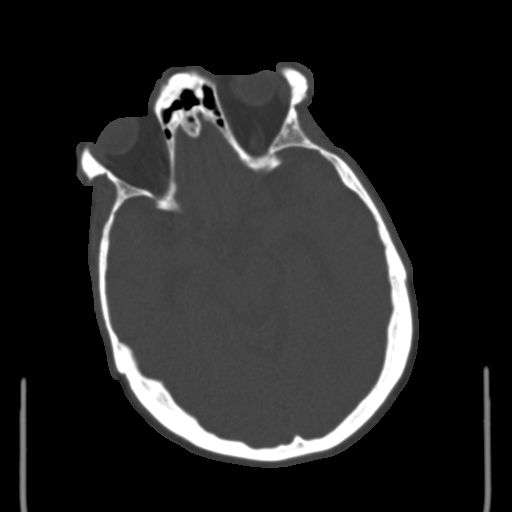
[im 84/91  brain]
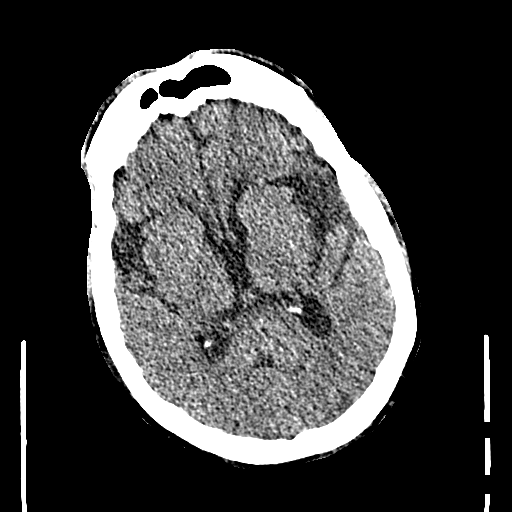
[im 84/91  bone]
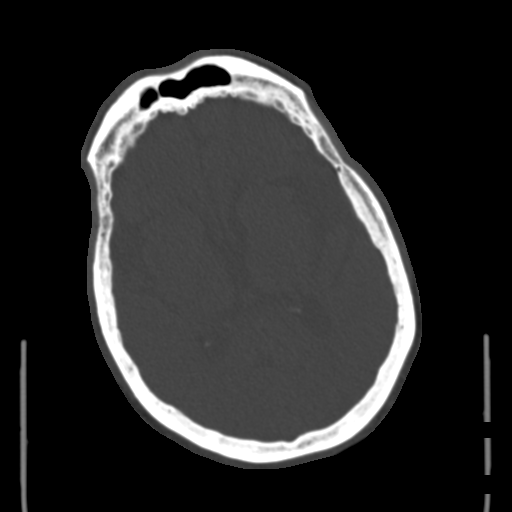

[Series 7: coronal soft · coronal · 0.38mm/px · 3 of 143 slices shown]
[im 48/143  bone]
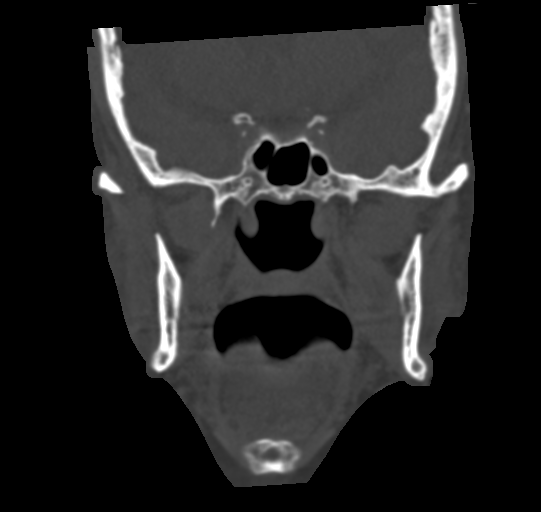
[im 64/143  bone]
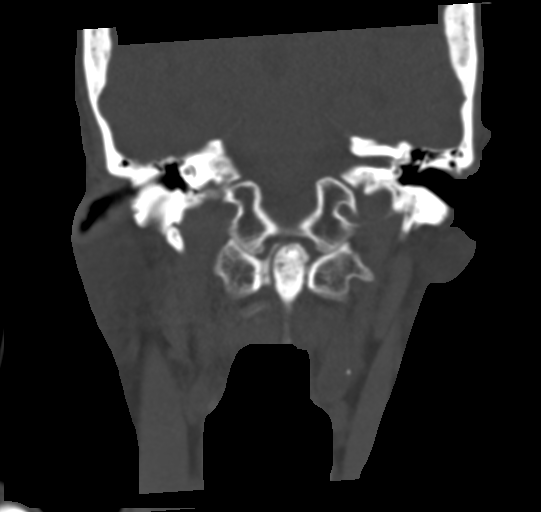
[im 79/143  bone]
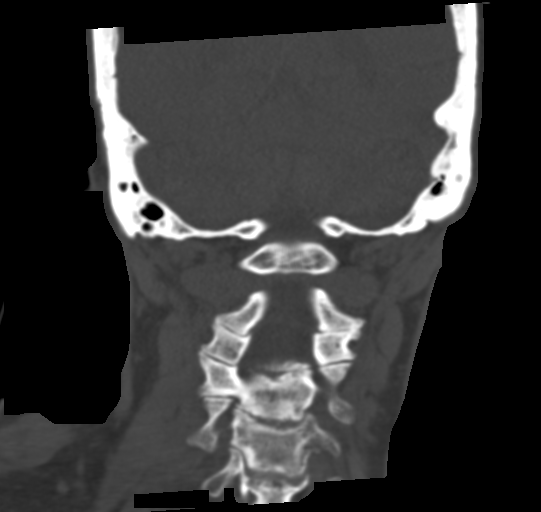

[Series 10: sagittal bone · sagittal · 0.39mm/px · 2 of 97 slices shown]
[im 33/97  bone]
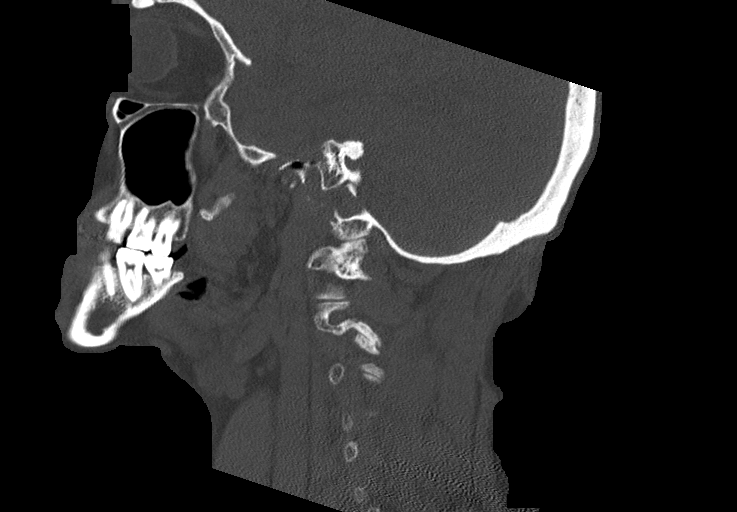
[im 65/97  bone]
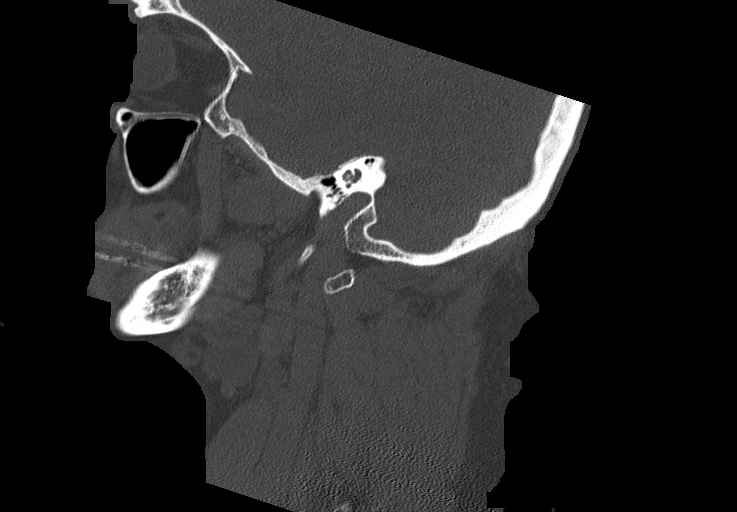

[14 of 47 positions shown; findings below may reference images not displayed]

FINDINGS: CT HEAD FINDINGS

Brain: No evidence of acute infarction, hemorrhage, hydrocephalus,
extra-axial collection or mass lesion/mass effect.

Atrophy and chronic small-vessel ischemic changes are again noted.

Vascular: Carotid atherosclerotic calcifications are noted.

Skull: No acute abnormality.

Other: None

CT MAXILLOFACIAL FINDINGS

Osseous: No acute fracture or dislocation. Remote fractures of the
nasal bones and nasal septum again noted. No focal bony lesions are
identified.

Orbits: Negative. No traumatic or inflammatory finding.

Sinuses: Clear.

Soft tissues: Negative.

CT CERVICAL SPINE FINDINGS

Alignment: Normal.

Skull base and vertebrae: No acute fracture. No primary bone lesion
or focal pathologic process.

Soft tissues and spinal canal: No prevertebral fluid or swelling. No
visible canal hematoma.

Disc levels: Moderate multilevel degenerative disc
disease/spondylosis again noted contributing to central spinal and
bony foraminal narrowing at multiple levels.

Upper chest: No acute abnormality.

Other: None
IMPRESSION: 1. No evidence of acute intracranial abnormality. Atrophy and
chronic small-vessel ischemic changes.
2. No evidence of acute facial fracture. Remote fractures of the
nasal bones and nasal septum.
3. No static evidence of acute injury to the cervical spine.
Moderate multilevel degenerative disc disease/spondylosis.

## 2022-06-04 IMAGING — CT CT HEAD W/O CM
3 series · 15 of 47 positions shown, 18 images · non-contrast
Comparison: 04/28/2022 and prior CTs

CLINICAL DATA: 55-year-old male with head neck and facial pain
following fall. Initial encounter.



[Series 3: head wo · axial · 0.47mm/px · z∈[+1300,+1450]mm · 9 of 36 slices shown, 12 images]
[im 3/36  brain]
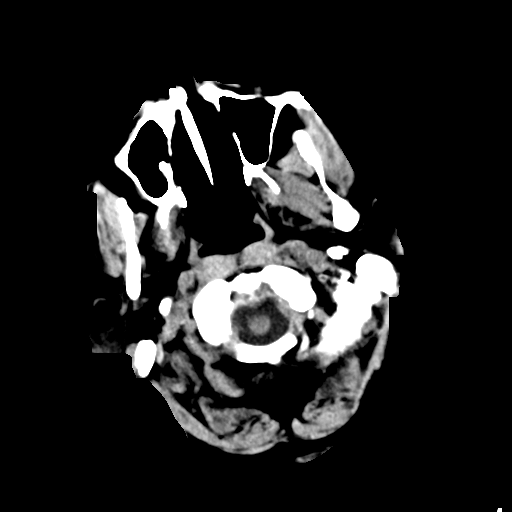
[im 3/36  bone]
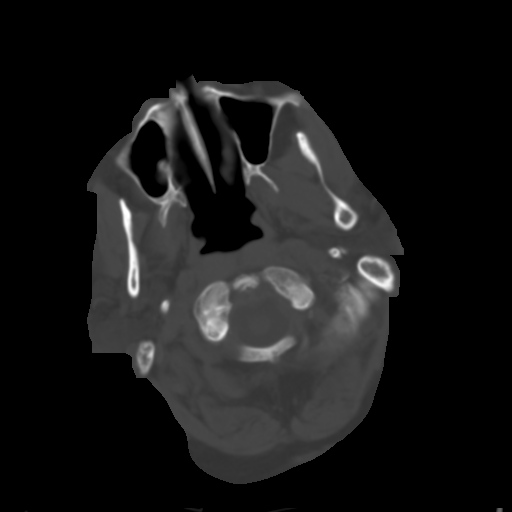
[im 7/36  brain]
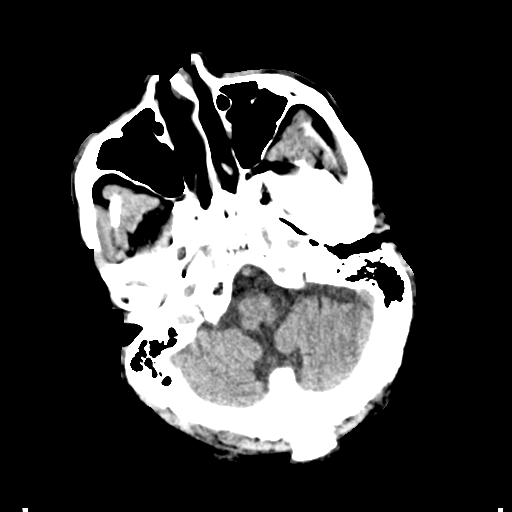
[im 10/36  brain]
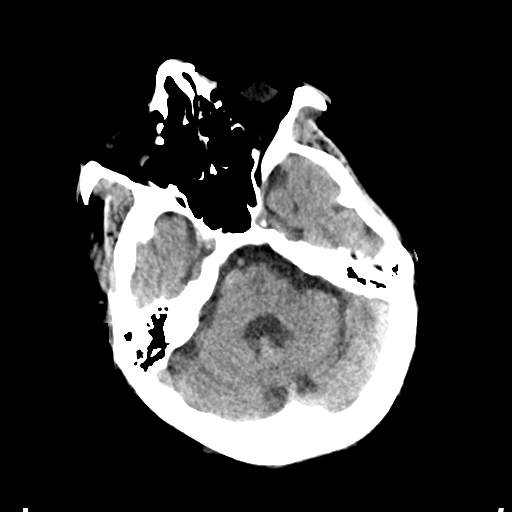
[im 14/36  brain]
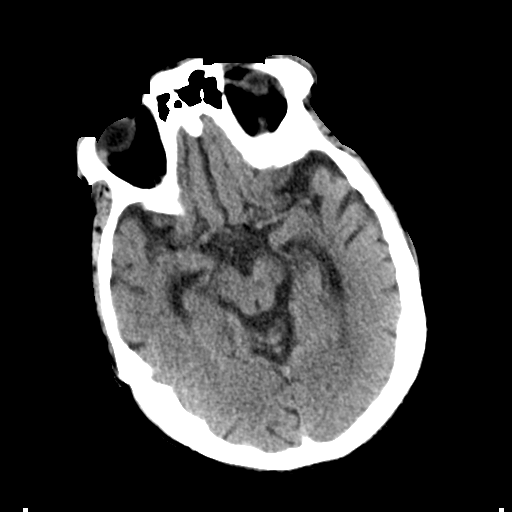
[im 19/36  brain]
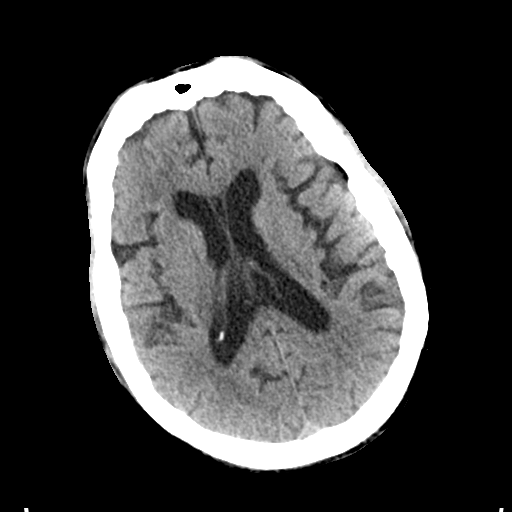
[im 19/36  bone]
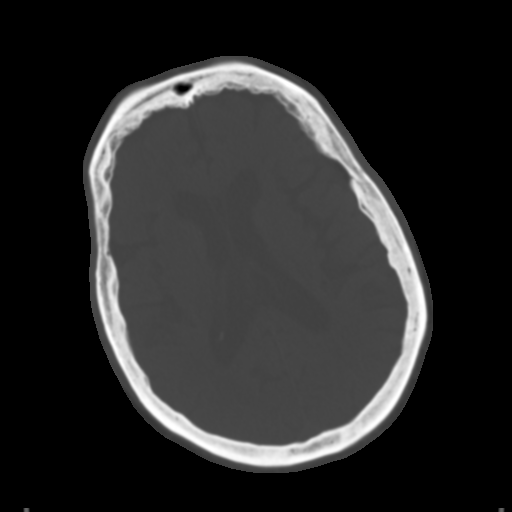
[im 22/36  brain]
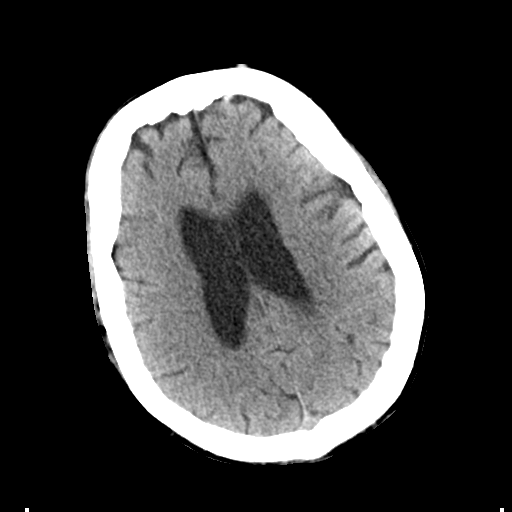
[im 26/36  brain]
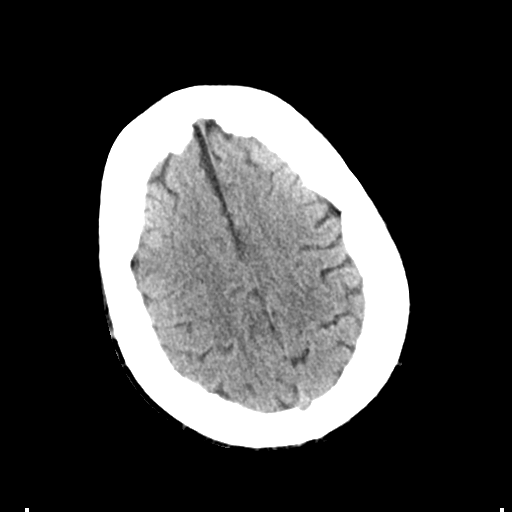
[im 29/36  brain]
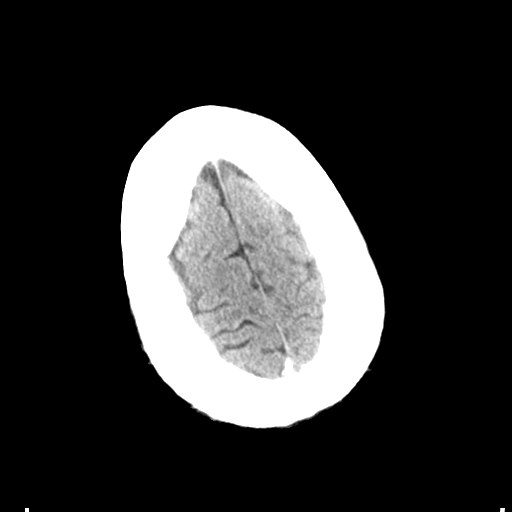
[im 33/36  brain]
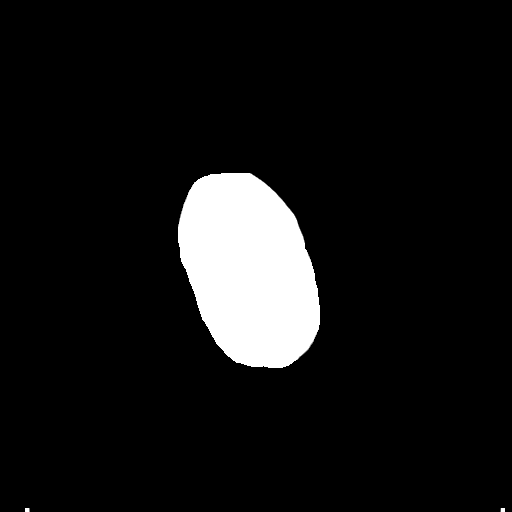
[im 33/36  bone]
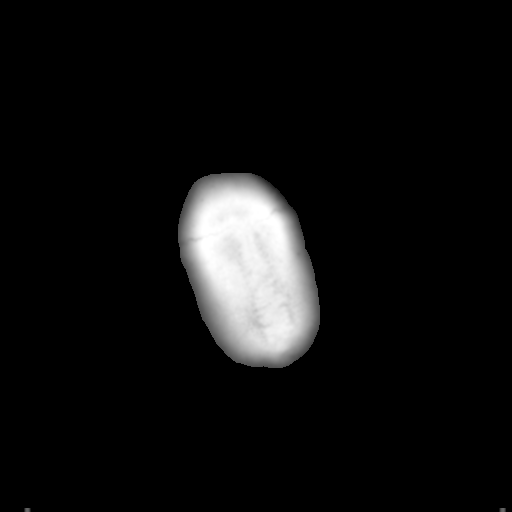

[Series 5: coronal soft tissue · coronal · 0.34mm/px · 3 of 75 slices shown]
[im 25/75  brain]
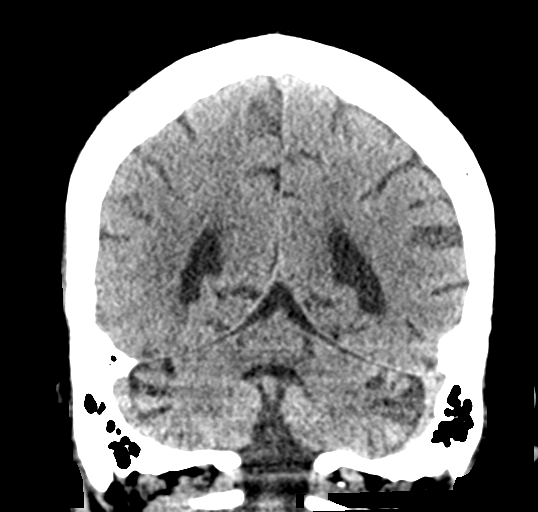
[im 33/75  brain]
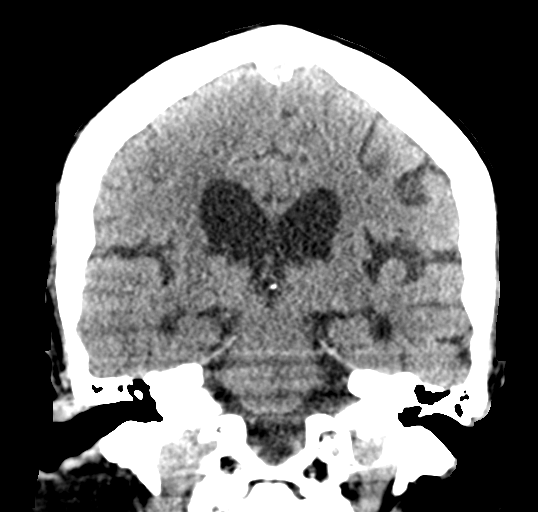
[im 42/75  brain]
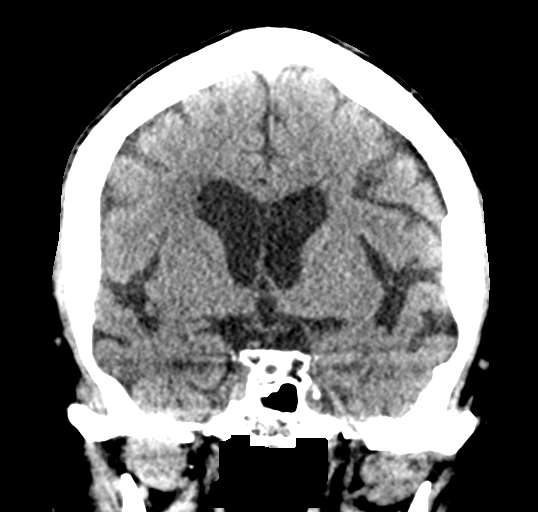

[Series 6: sagittal soft tissue · sagittal · 0.37mm/px · 3 of 61 slices shown]
[im 21/61  brain]
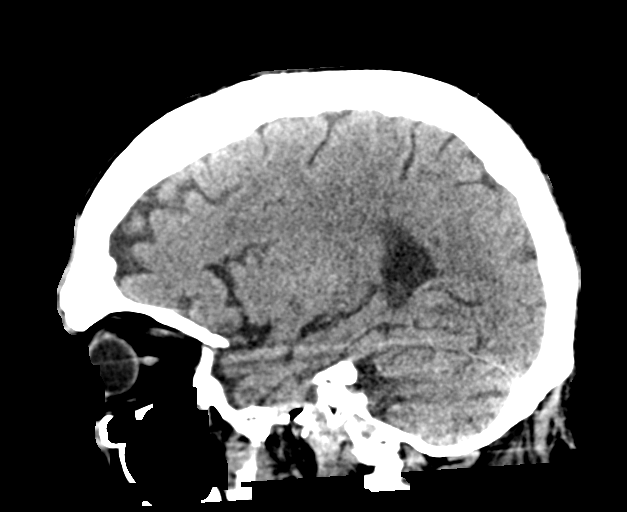
[im 31/61  brain]
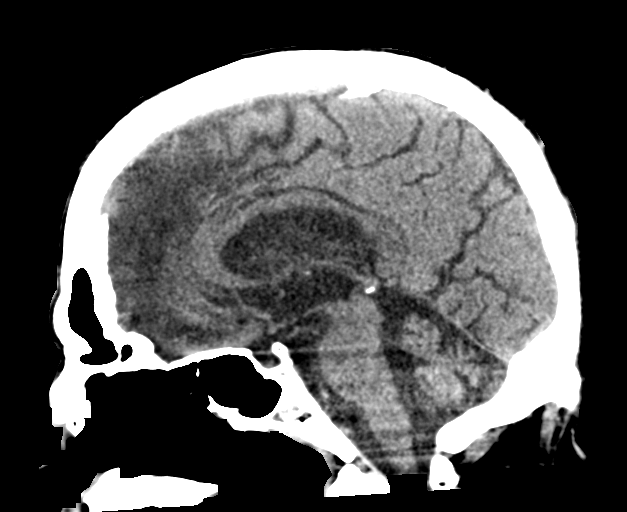
[im 41/61  brain]
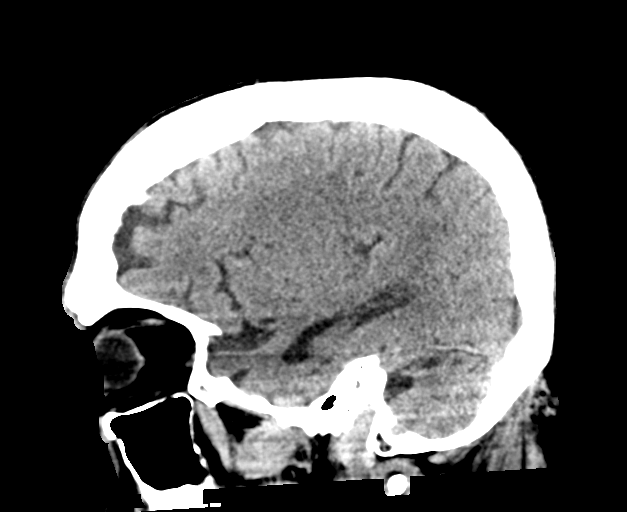

[15 of 47 positions shown; findings below may reference images not displayed]

FINDINGS: CT HEAD FINDINGS

Brain: No evidence of acute infarction, hemorrhage, hydrocephalus,
extra-axial collection or mass lesion/mass effect.

Atrophy and chronic small-vessel ischemic changes are again noted.

Vascular: Carotid atherosclerotic calcifications are noted.

Skull: No acute abnormality.

Other: None

CT MAXILLOFACIAL FINDINGS

Osseous: No acute fracture or dislocation. Remote fractures of the
nasal bones and nasal septum again noted. No focal bony lesions are
identified.

Orbits: Negative. No traumatic or inflammatory finding.

Sinuses: Clear.

Soft tissues: Negative.

CT CERVICAL SPINE FINDINGS

Alignment: Normal.

Skull base and vertebrae: No acute fracture. No primary bone lesion
or focal pathologic process.

Soft tissues and spinal canal: No prevertebral fluid or swelling. No
visible canal hematoma.

Disc levels: Moderate multilevel degenerative disc
disease/spondylosis again noted contributing to central spinal and
bony foraminal narrowing at multiple levels.

Upper chest: No acute abnormality.

Other: None
IMPRESSION: 1. No evidence of acute intracranial abnormality. Atrophy and
chronic small-vessel ischemic changes.
2. No evidence of acute facial fracture. Remote fractures of the
nasal bones and nasal septum.
3. No static evidence of acute injury to the cervical spine.
Moderate multilevel degenerative disc disease/spondylosis.

## 2022-06-04 IMAGING — CT CT CERVICAL SPINE W/O CM
3 of 4 series · 12 of 35 positions shown, 14 images · non-contrast
Comparison: 04/28/2022 and prior CTs

CLINICAL DATA: 55-year-old male with head neck and facial pain
following fall. Initial encounter.



[Series 5: orthogonal axials · axial · 0.43mm/px · z∈[+1126,+1317]mm · 4 of 144 slices shown, 5 images]
[im 21/144  soft-tissue]
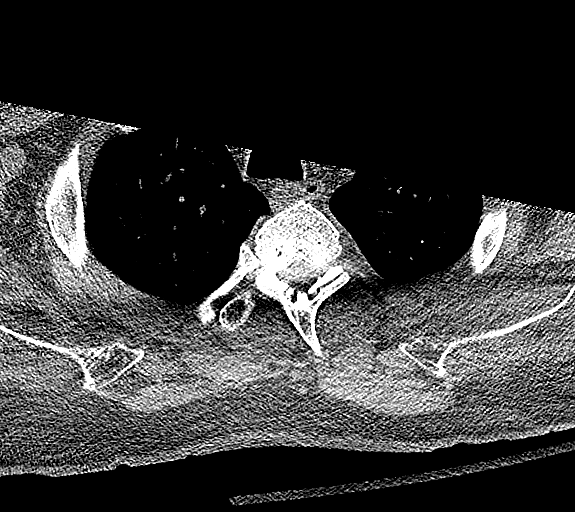
[im 21/144  bone]
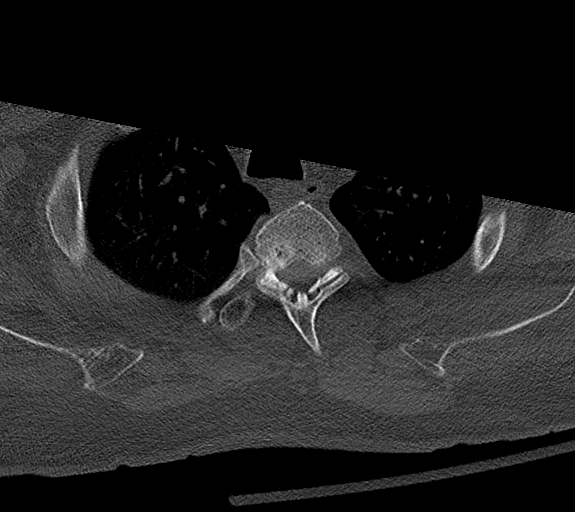
[im 62/144  bone]
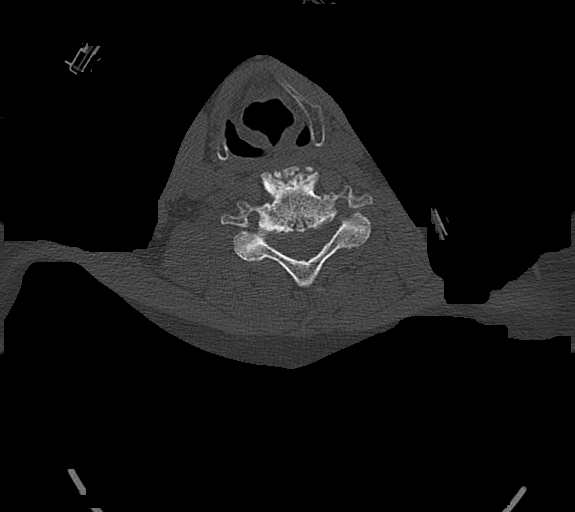
[im 82/144  bone]
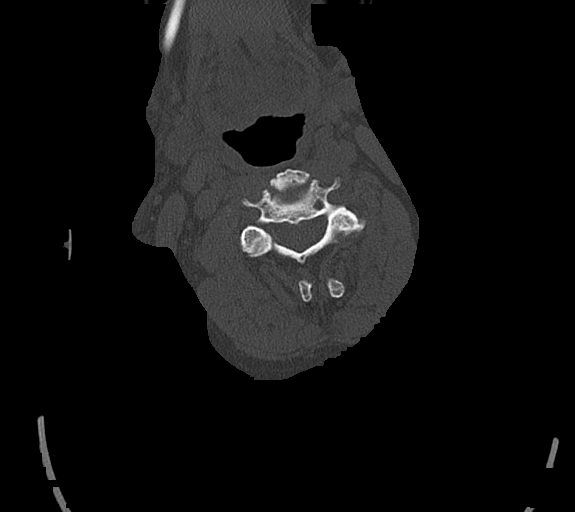
[im 123/144  bone]
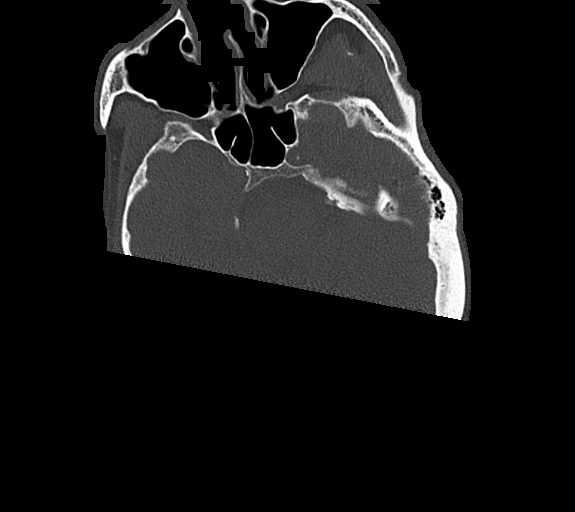

[Series 6: coronal bone · coronal · 0.48mm/px · 3 of 152 slices shown]
[im 58/152  bone]
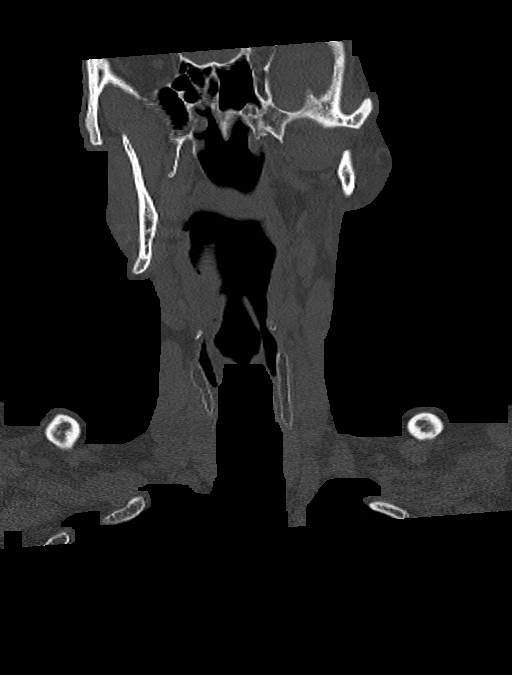
[im 70/152  bone]
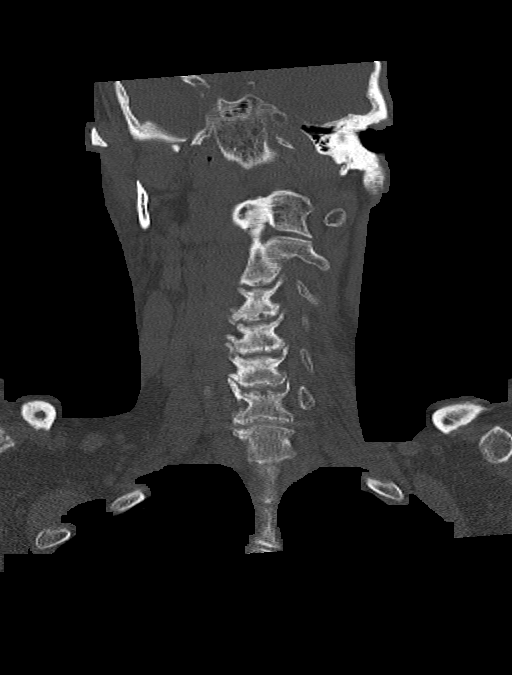
[im 82/152  bone]
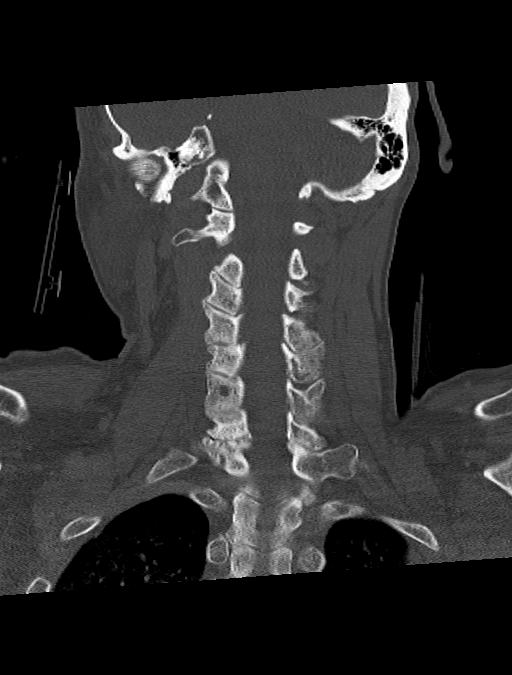

[Series 7: sagittal bone · sagittal · 0.42mm/px · 5 of 96 slices shown, 6 images]
[im 32/96  bone]
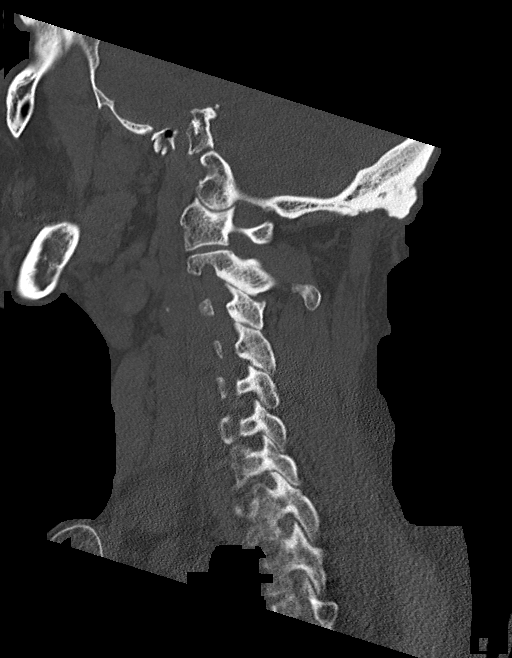
[im 40/96  bone]
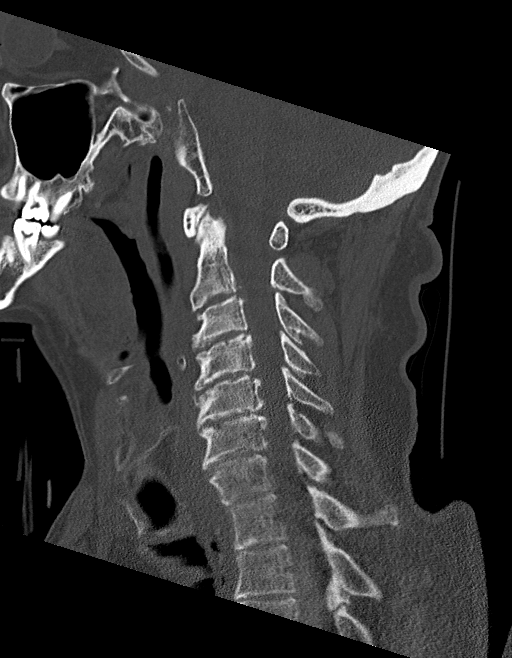
[im 48/96  soft-tissue]
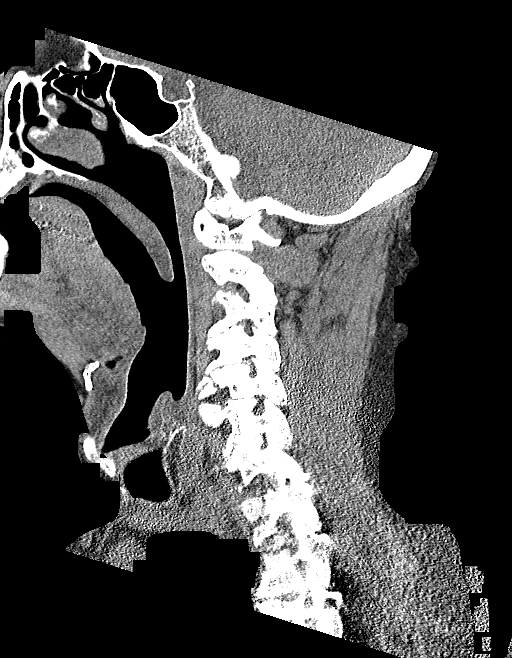
[im 48/96  bone]
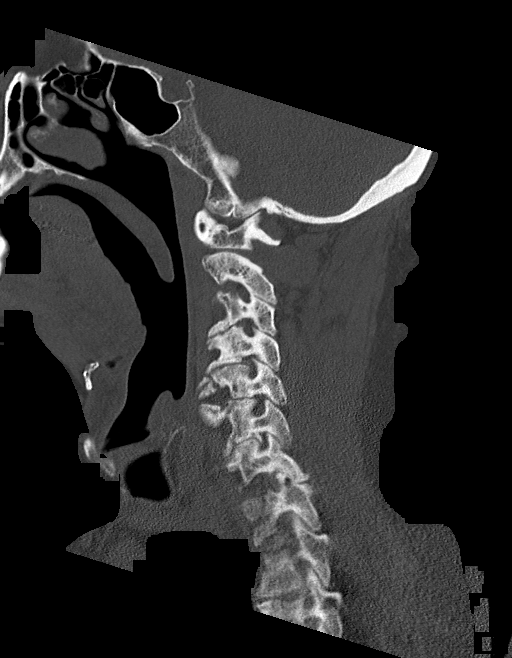
[im 56/96  bone]
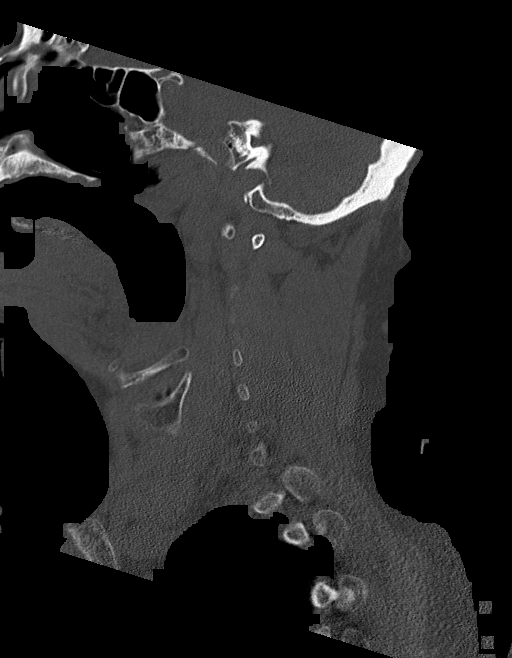
[im 64/96  bone]
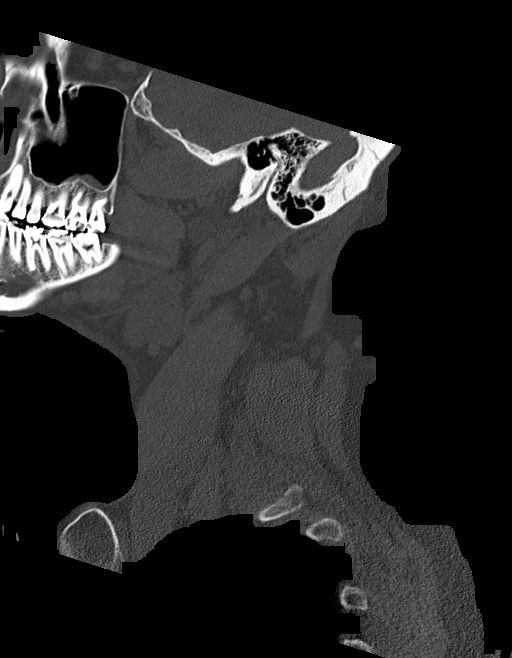

[12 of 35 positions shown; findings below may reference images not displayed]

FINDINGS: CT HEAD FINDINGS

Brain: No evidence of acute infarction, hemorrhage, hydrocephalus,
extra-axial collection or mass lesion/mass effect.

Atrophy and chronic small-vessel ischemic changes are again noted.

Vascular: Carotid atherosclerotic calcifications are noted.

Skull: No acute abnormality.

Other: None

CT MAXILLOFACIAL FINDINGS

Osseous: No acute fracture or dislocation. Remote fractures of the
nasal bones and nasal septum again noted. No focal bony lesions are
identified.

Orbits: Negative. No traumatic or inflammatory finding.

Sinuses: Clear.

Soft tissues: Negative.

CT CERVICAL SPINE FINDINGS

Alignment: Normal.

Skull base and vertebrae: No acute fracture. No primary bone lesion
or focal pathologic process.

Soft tissues and spinal canal: No prevertebral fluid or swelling. No
visible canal hematoma.

Disc levels: Moderate multilevel degenerative disc
disease/spondylosis again noted contributing to central spinal and
bony foraminal narrowing at multiple levels.

Upper chest: No acute abnormality.

Other: None
IMPRESSION: 1. No evidence of acute intracranial abnormality. Atrophy and
chronic small-vessel ischemic changes.
2. No evidence of acute facial fracture. Remote fractures of the
nasal bones and nasal septum.
3. No static evidence of acute injury to the cervical spine.
Moderate multilevel degenerative disc disease/spondylosis.

## 2022-06-10 IMAGING — CT CT CERVICAL SPINE W/O CM
3 of 4 series · 13 of 33 positions shown, 16 images · non-contrast
Comparison: CT cervical spine 05/02/2022

CLINICAL DATA: Trauma



[Series 5: orthogonal bone · axial · 0.28mm/px · z∈[-304,-172]mm · 5 of 101 slices shown, 7 images]
[im 15/101  soft-tissue]
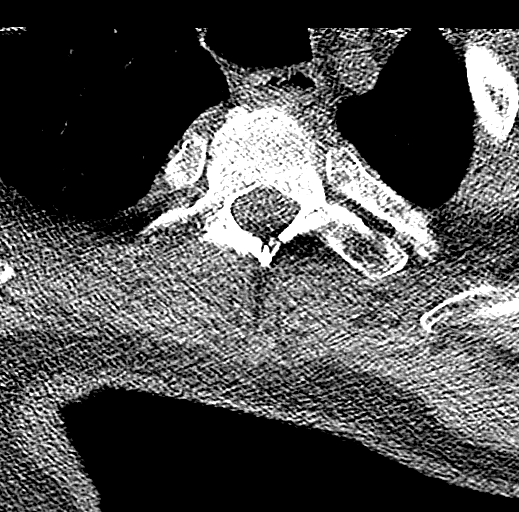
[im 15/101  bone]
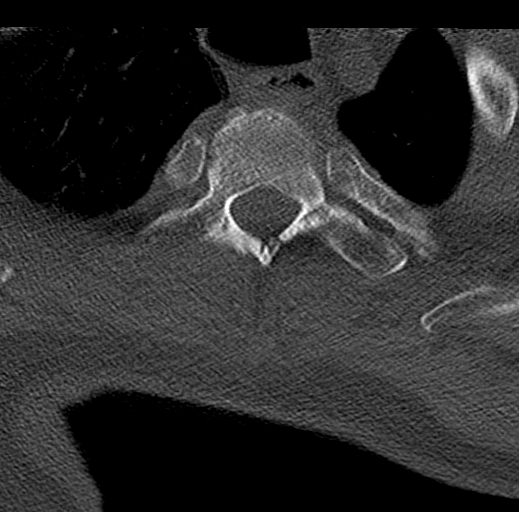
[im 29/101  bone]
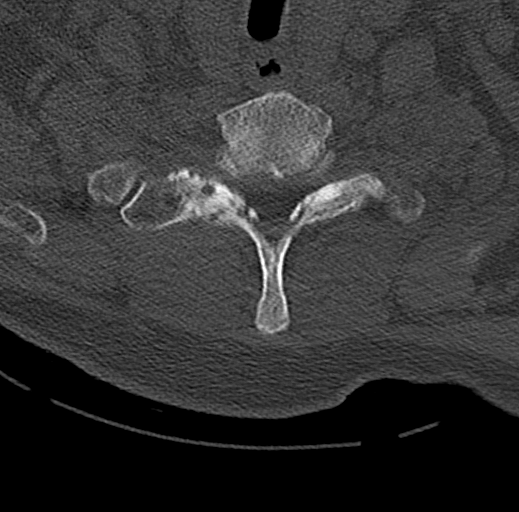
[im 58/101  bone]
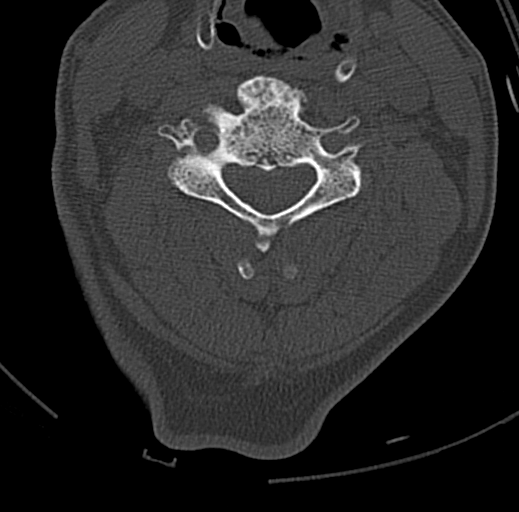
[im 72/101  bone]
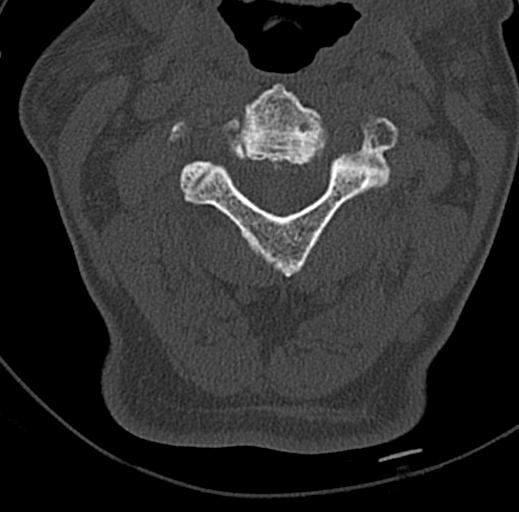
[im 86/101  soft-tissue]
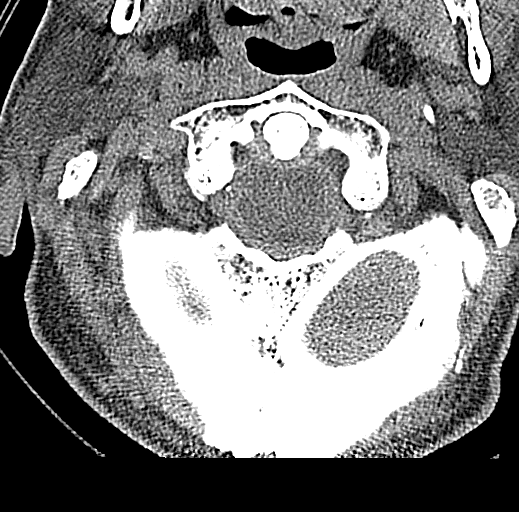
[im 86/101  bone]
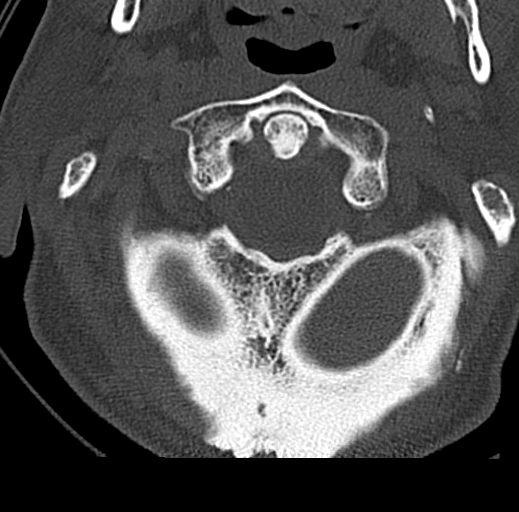

[Series 6: coronal bone · coronal · 0.28mm/px · 3 of 72 slices shown]
[im 17/72  bone]
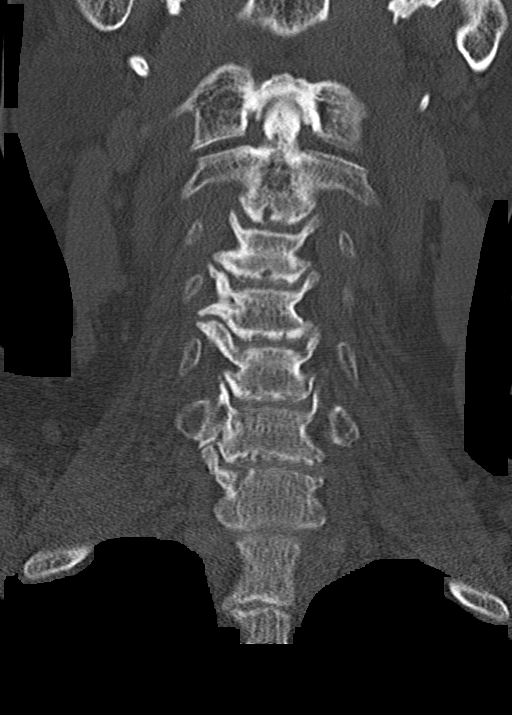
[im 30/72  bone]
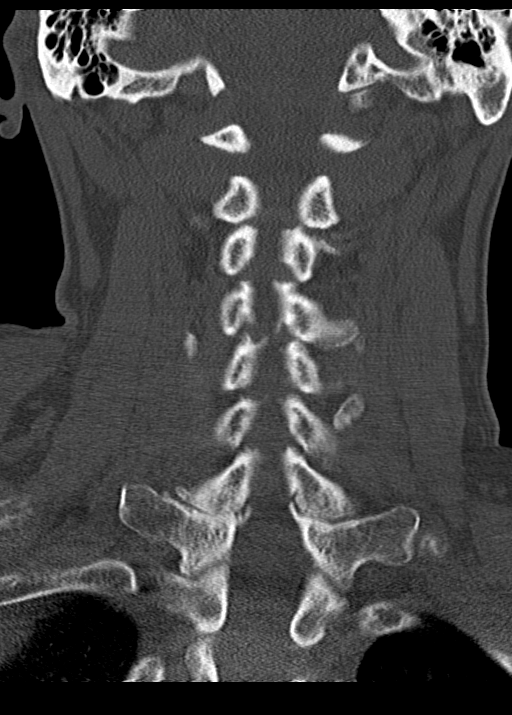
[im 43/72  bone]
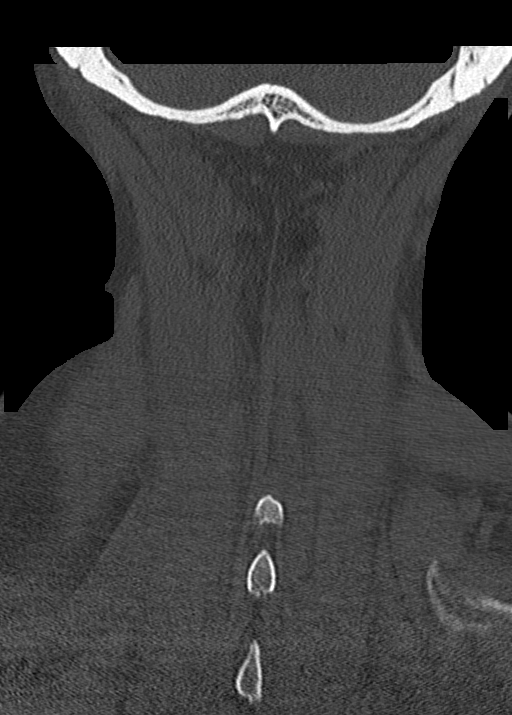

[Series 7: sagittal bone · sagittal · 0.28mm/px · 5 of 73 slices shown, 6 images]
[im 25/73  bone]
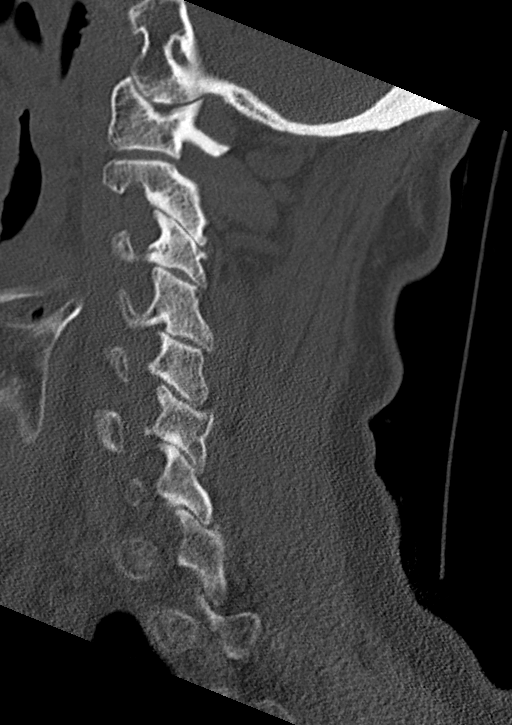
[im 31/73  bone]
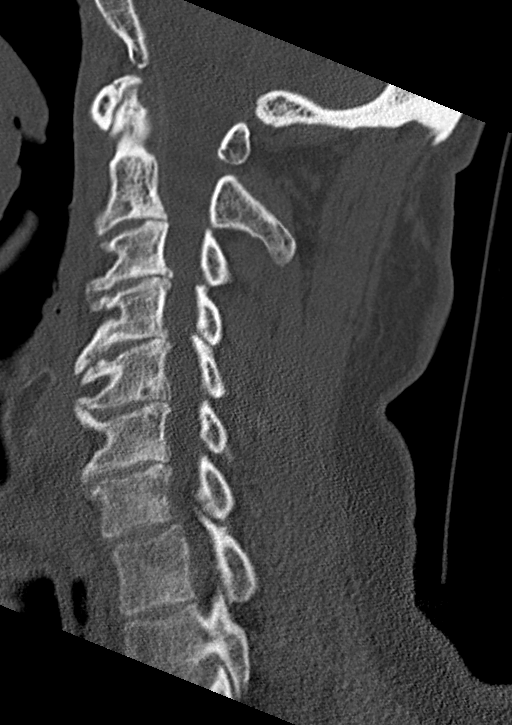
[im 37/73  soft-tissue]
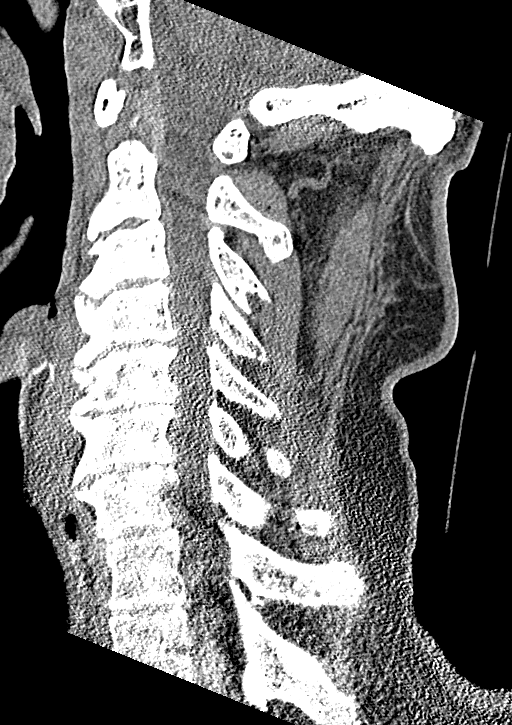
[im 37/73  bone]
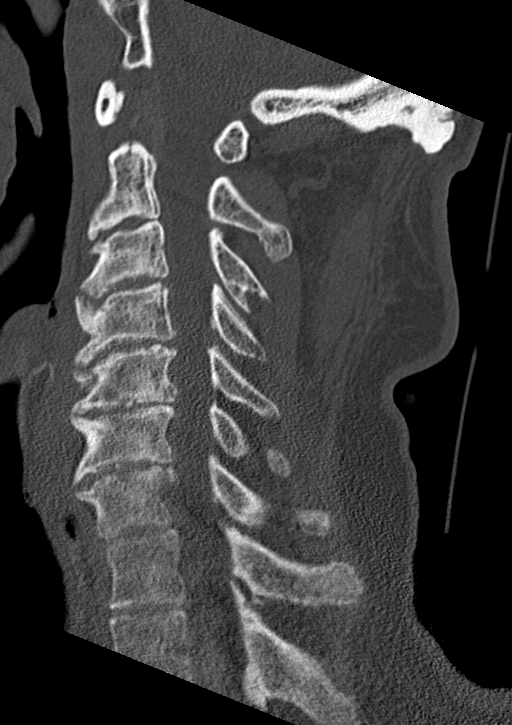
[im 43/73  bone]
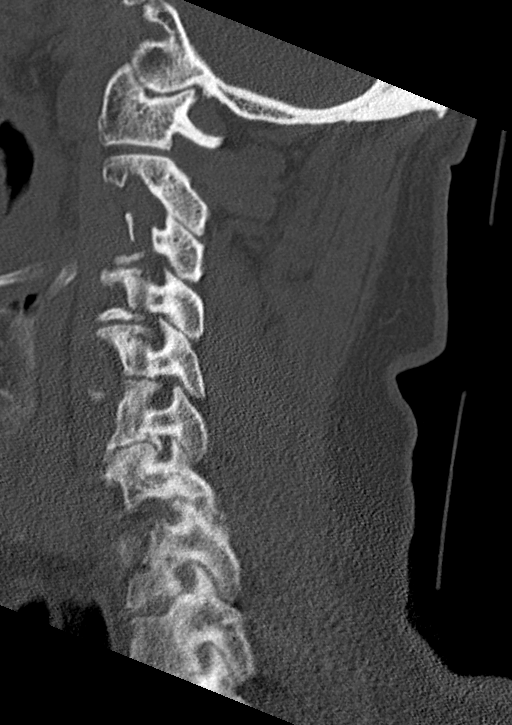
[im 49/73  bone]
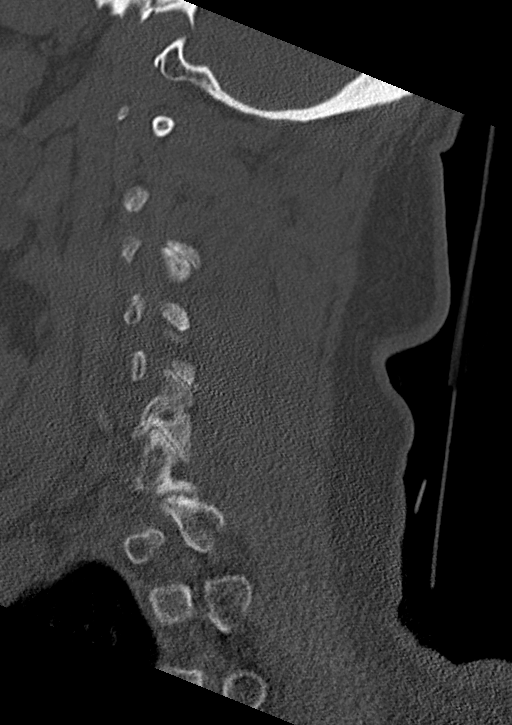

[13 of 33 positions shown; findings below may reference images not displayed]

FINDINGS: Alignment: Straightening of the cervical spine. No subluxation.
Facet alignment within normal limits.

Skull base and vertebrae: No acute fracture. No primary bone lesion
or focal pathologic process.

Soft tissues and spinal canal: No prevertebral fluid or swelling. No
visible canal hematoma.

Disc levels: Advanced degenerative changes throughout the cervical
spine with diffuse disc space narrowing and osteophyte. Facet
degenerative changes at multiple levels with foraminal stenosis

Upper chest: Negative.

Other: None
IMPRESSION: Straightening of the cervical spine with degenerative changes. No
acute osseous abnormality

## 2022-06-10 IMAGING — CT CT HEAD W/O CM
3 series · 16 of 47 positions shown, 19 images · non-contrast
Comparison: 05/02/2022

CLINICAL DATA: Trauma



[Series 3: head wo · axial · 0.47mm/px · z∈[-147,+8]mm · 10 of 37 slices shown, 13 images]
[im 3/37  brain]
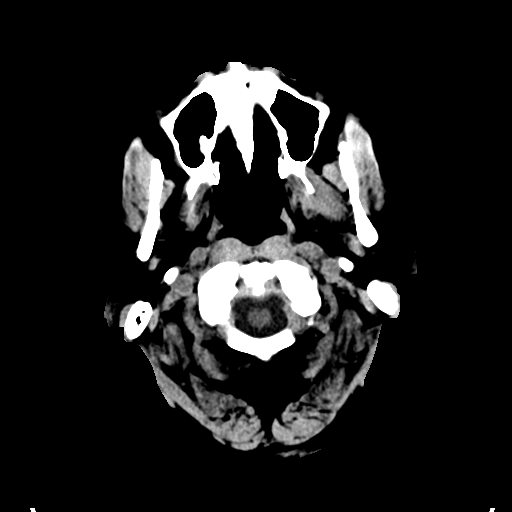
[im 3/37  bone]
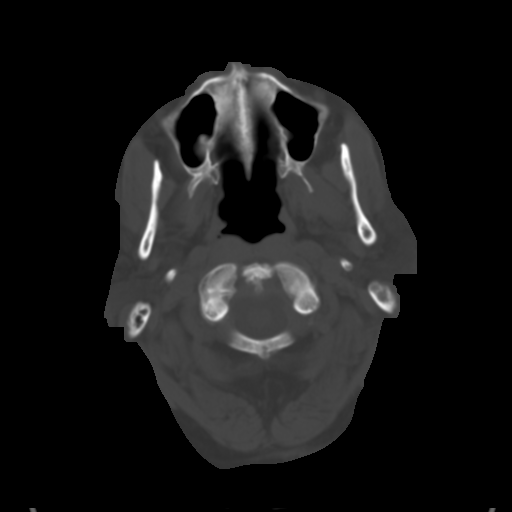
[im 7/37  brain]
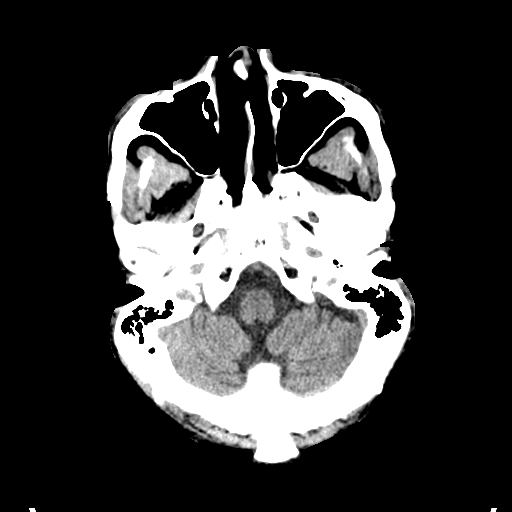
[im 10/37  brain]
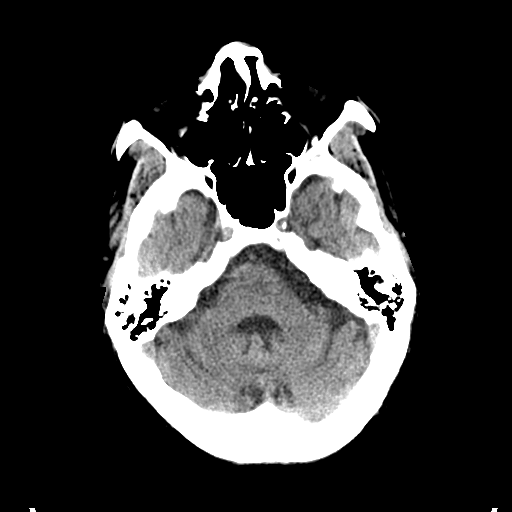
[im 13/37  brain]
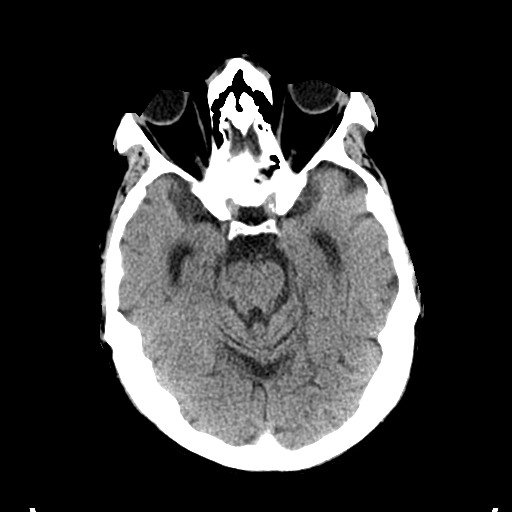
[im 17/37  brain]
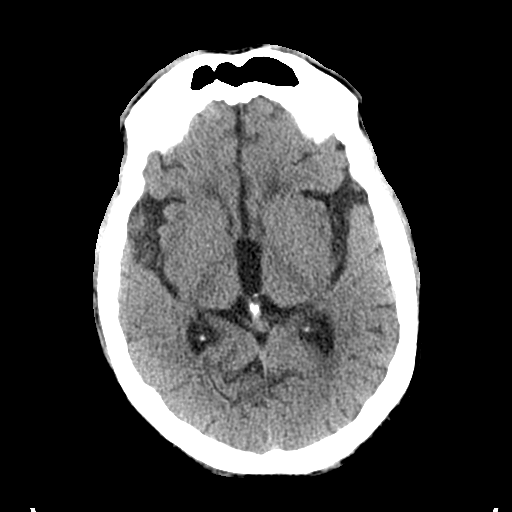
[im 17/37  bone]
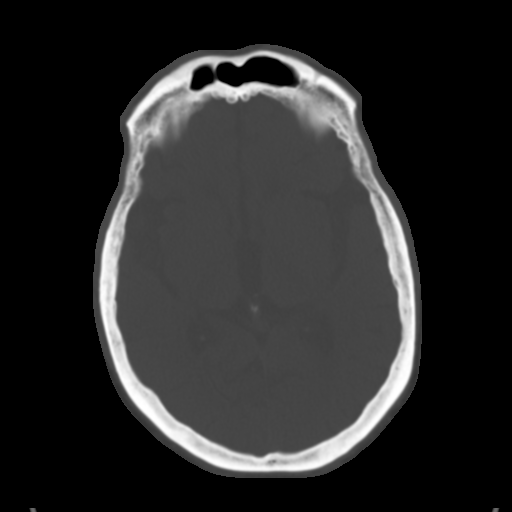
[im 20/37  brain]
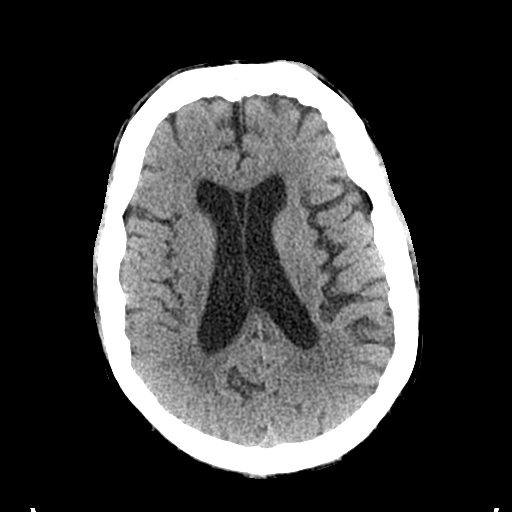
[im 24/37  brain]
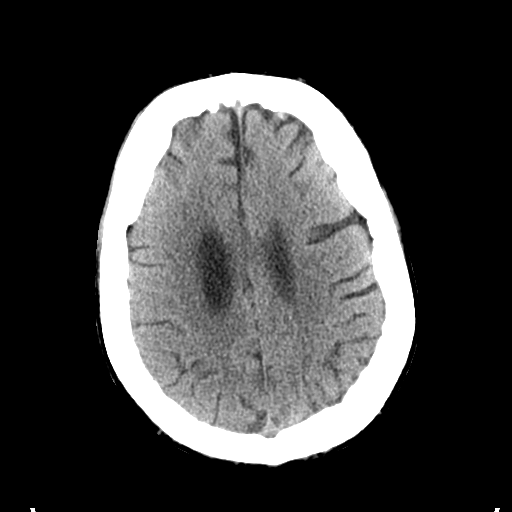
[im 28/37  brain]
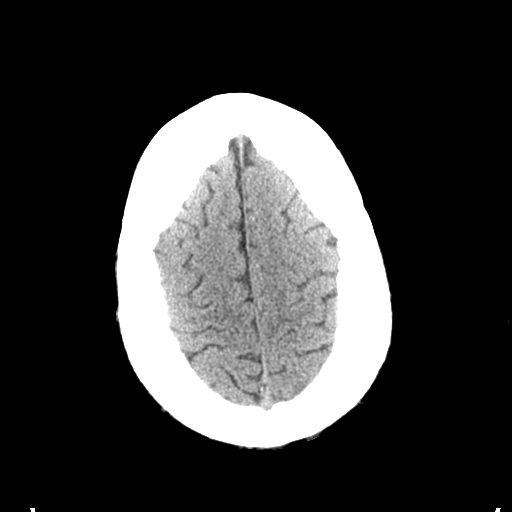
[im 30/37  brain]
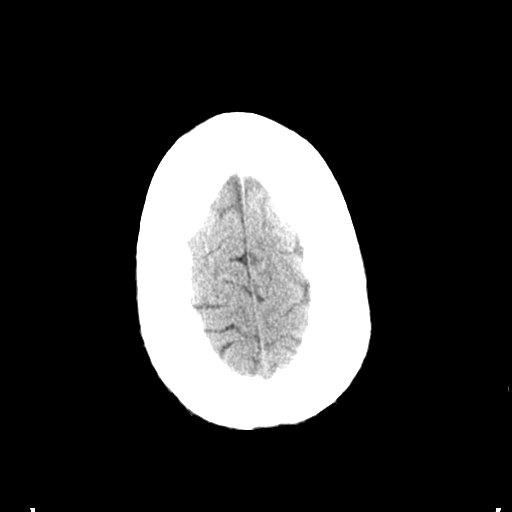
[im 30/37  bone]
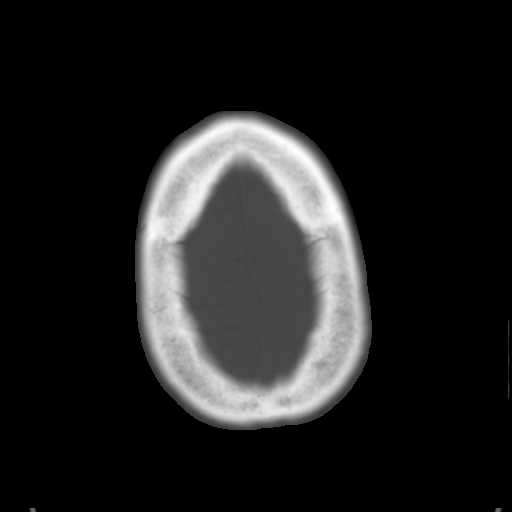
[im 34/37  brain]
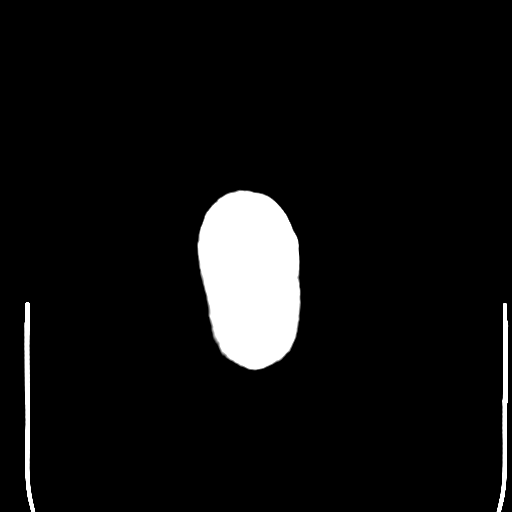

[Series 5: coronal soft tissue · coronal · 0.38mm/px · 3 of 74 slices shown]
[im 25/74  brain]
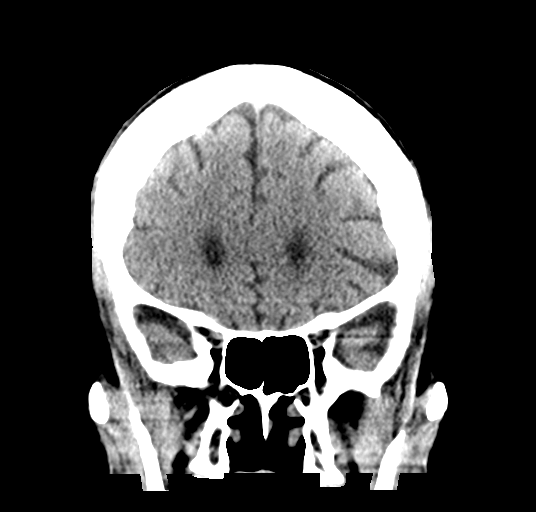
[im 33/74  brain]
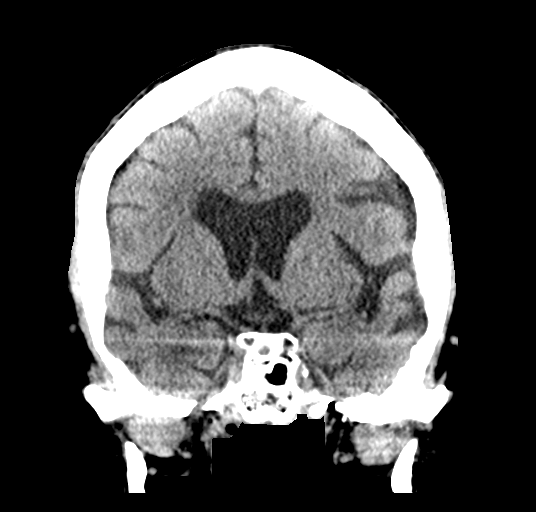
[im 41/74  brain]
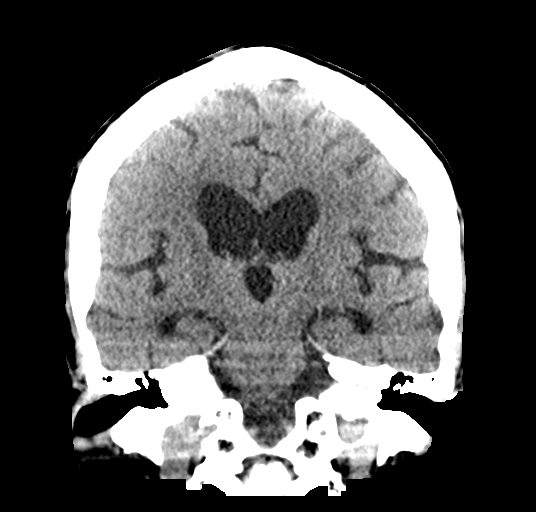

[Series 6: sagittal soft tissue · sagittal · 0.38mm/px · 3 of 68 slices shown]
[im 23/68  brain]
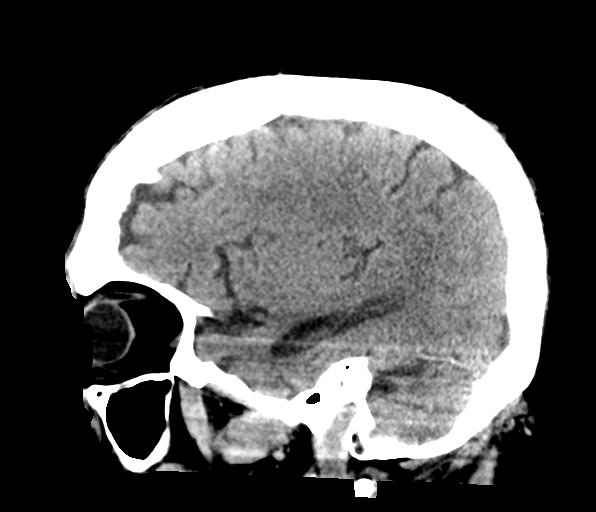
[im 34/68  brain]
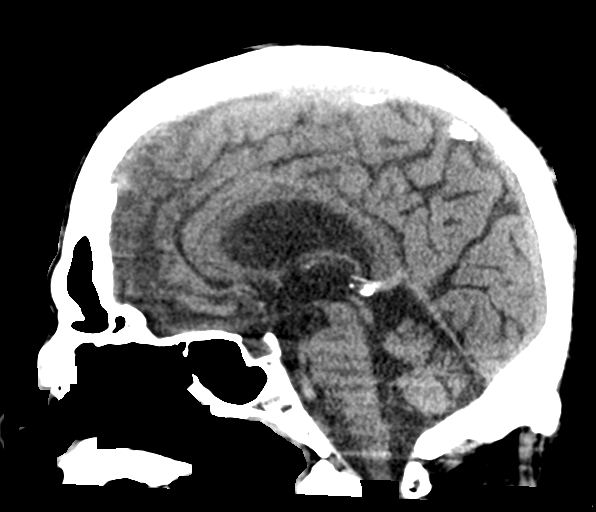
[im 45/68  brain]
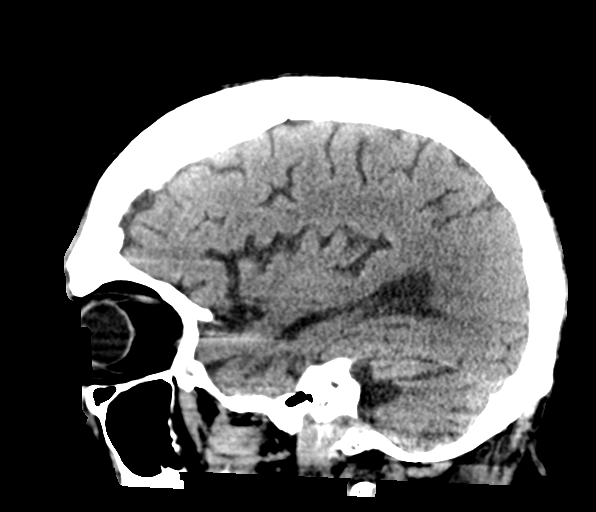

[16 of 47 positions shown; findings below may reference images not displayed]

FINDINGS: Brain: No acute intracranial findings are seen. Cortical sulci are
prominent. There is subtle decreased density in the periventricular
white matter. There is no focal edema or mass effect.

Vascular: Scattered arterial calcifications are seen.

Skull: No fracture is seen in the calvarium.

Sinuses/Orbits: There is mild mucosal thickening in the maxillary
sinuses.

Other: None.
IMPRESSION: No acute intracranial findings are seen in the noncontrast CT brain.

## 2022-06-10 IMAGING — CR DG SHOULDER 2+V*R*
3 series · 3 of 3 positions shown · non-contrast
Comparison: 07/27/2021

CLINICAL DATA: Unwitnessed fall

EXAM:
RIGHT SHOULDER - 2+ VIEW

[x shoulder ap right (1 of 3)]
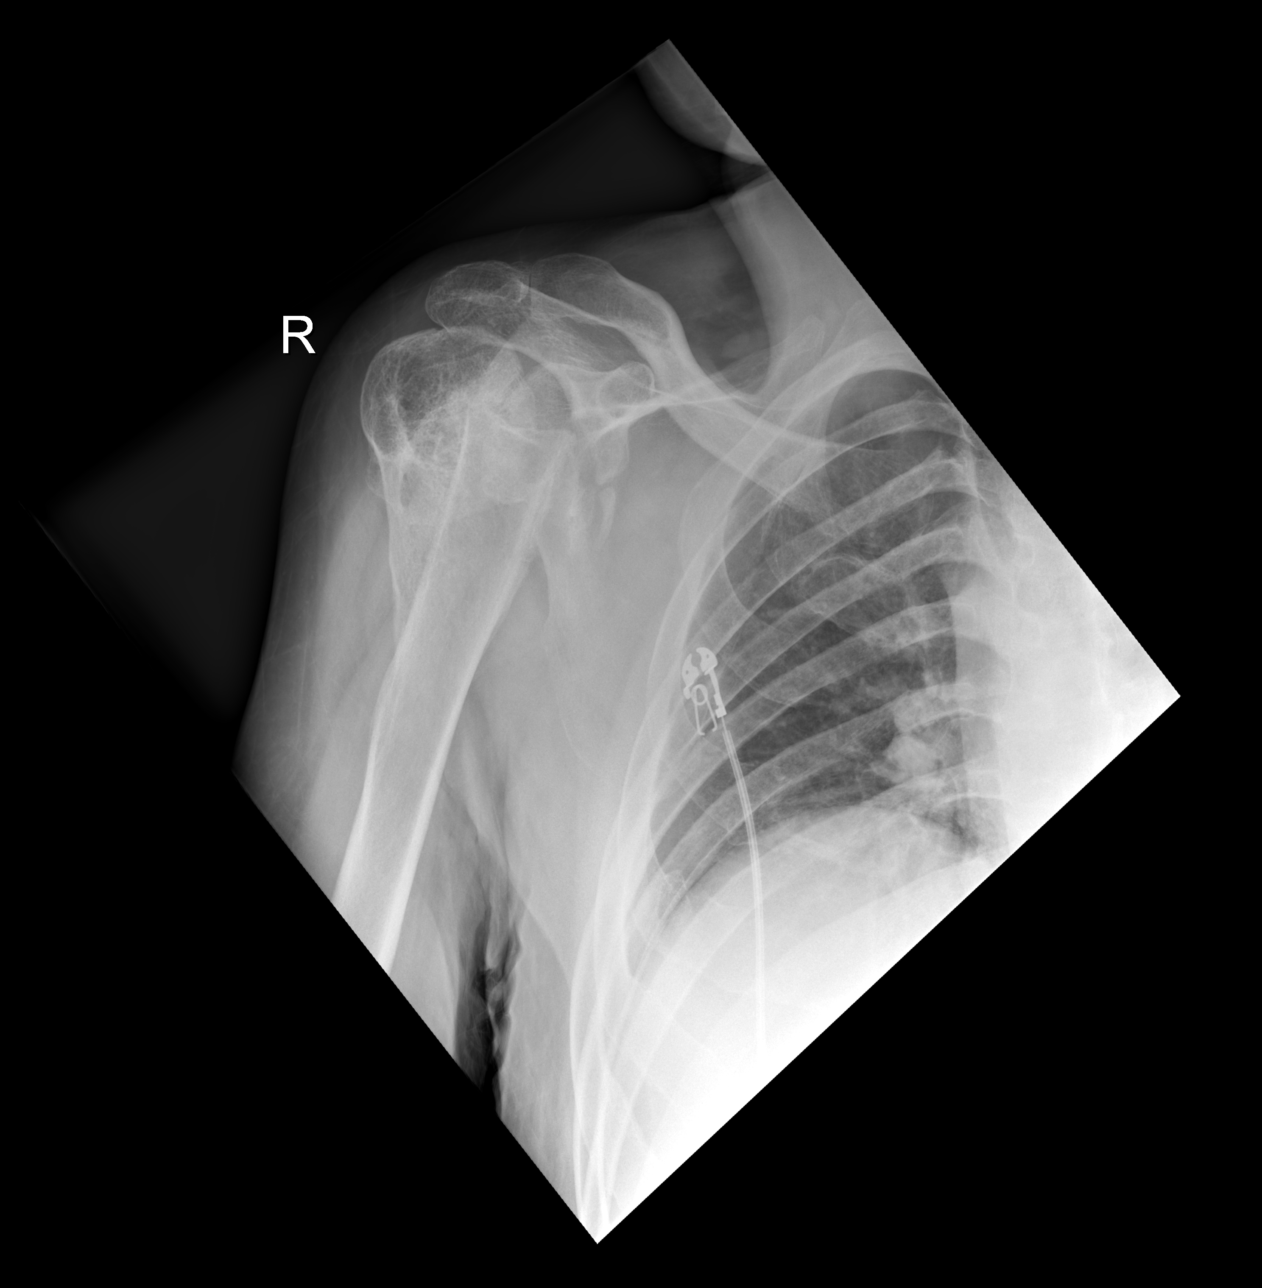

[x shoulder ap right (2 of 3)]
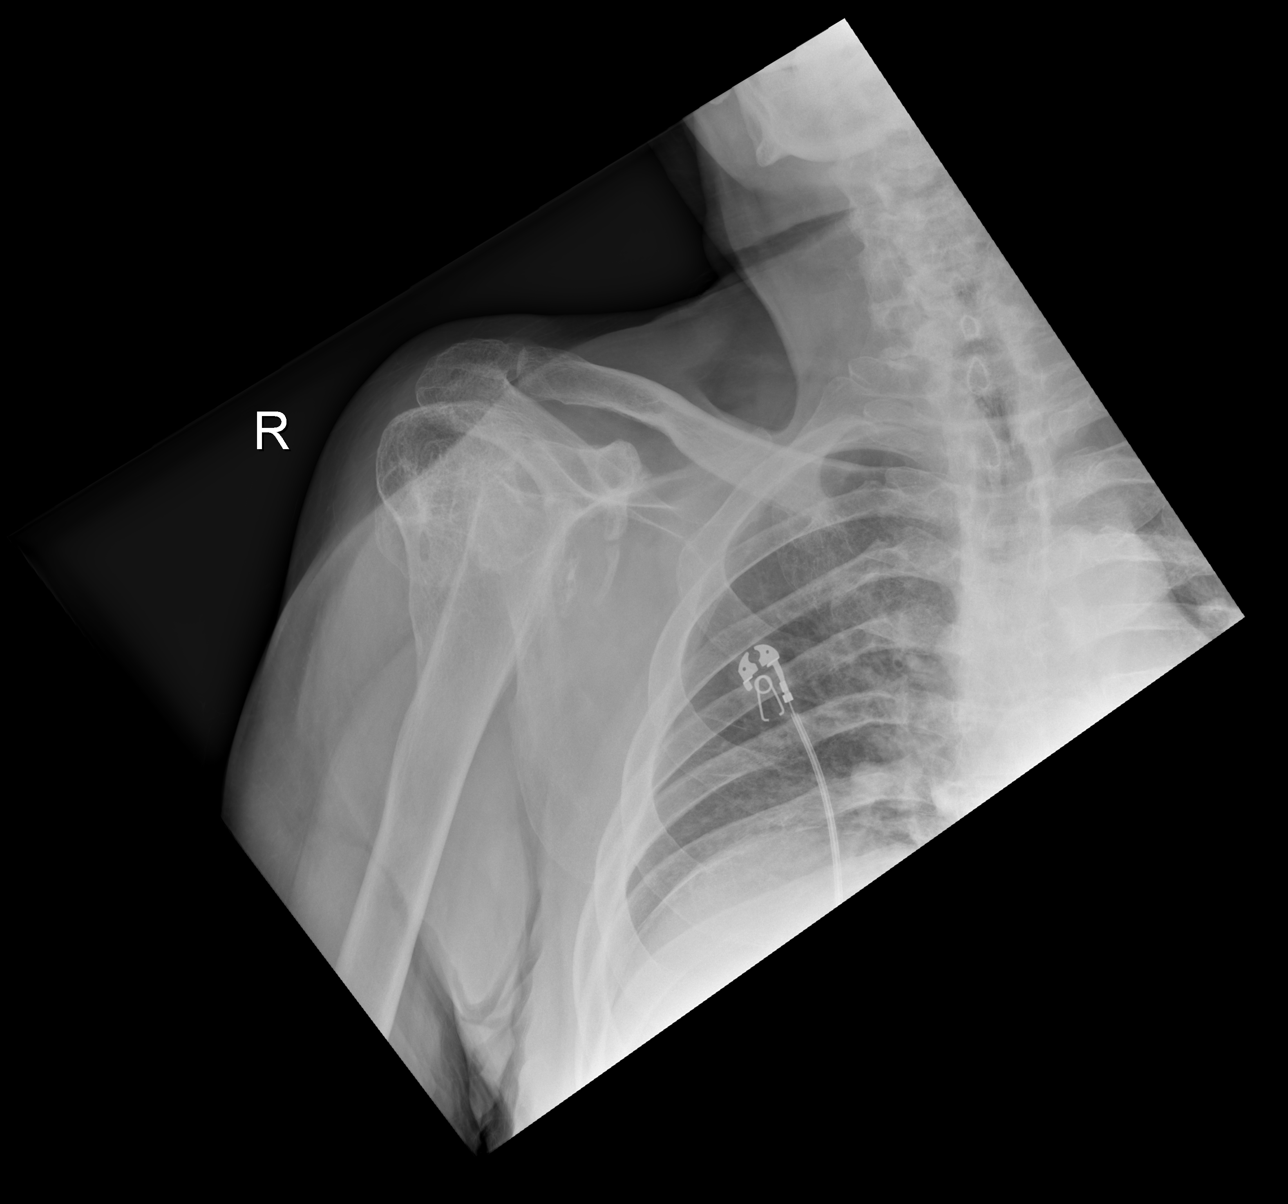

[x shoulder ap right (3 of 3)]
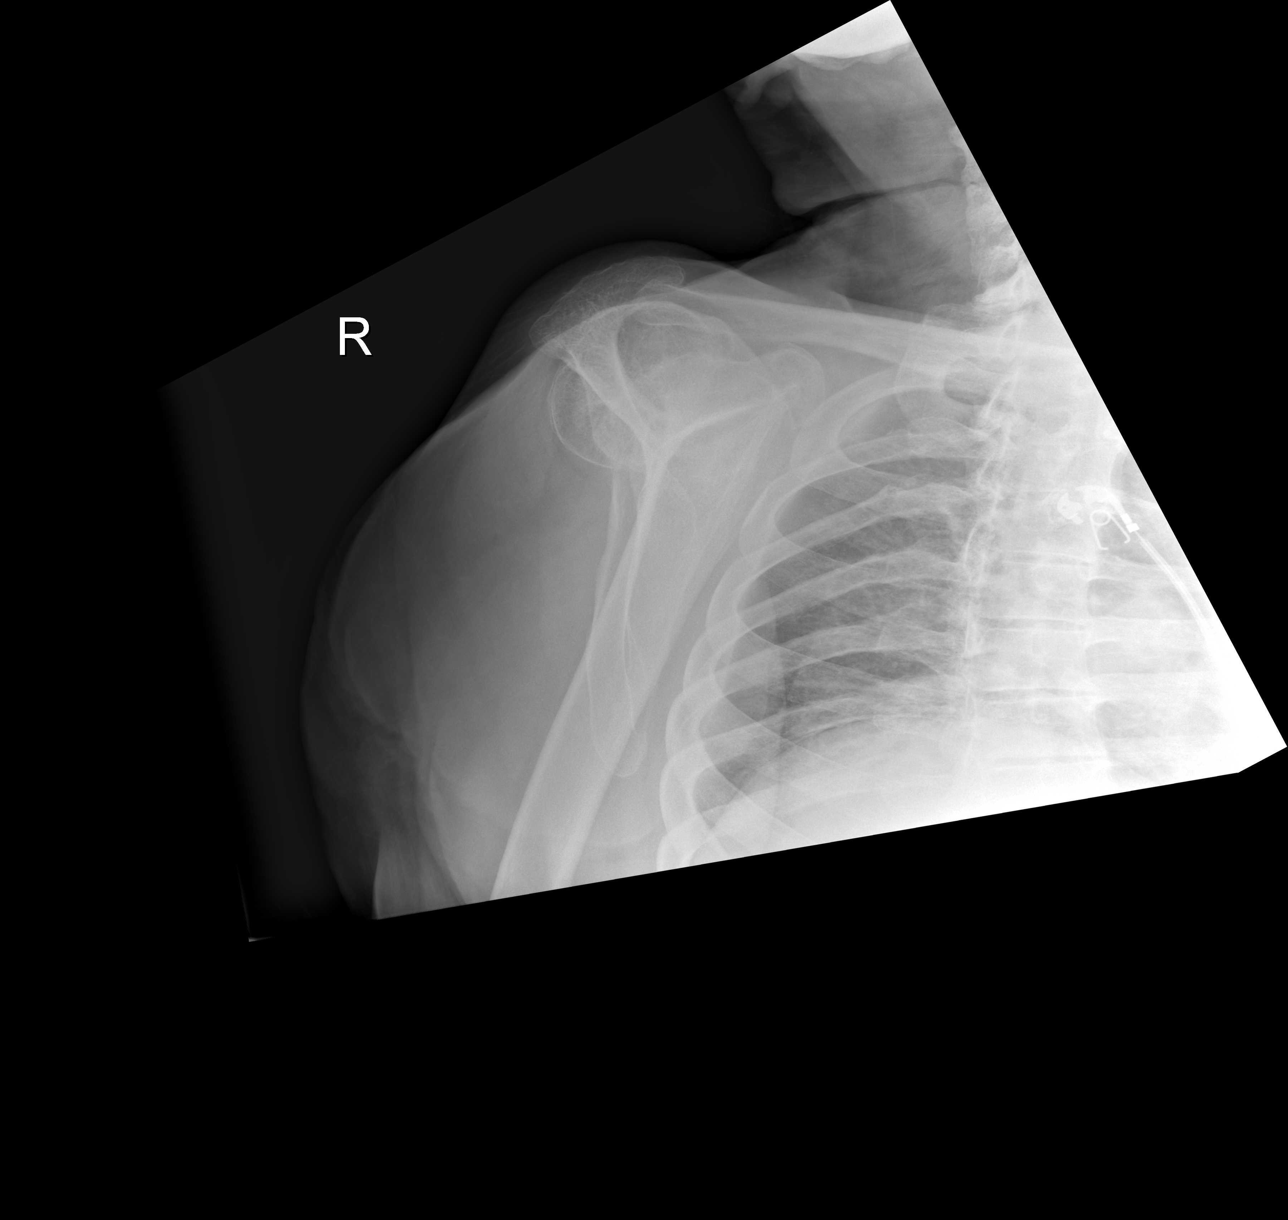

[3 of 3 positions shown; findings below may reference images not displayed]

FINDINGS: Old healed fracture with deformity of the proximal right humerus. No
acute fracture, subluxation or dislocation. No change since prior
study.
IMPRESSION: Old healed proximal right humeral fracture with deformity. No acute
bony abnormality.

## 2022-06-10 IMAGING — CR DG CHEST 1V PORT
1 series · 1 of 1 positions shown · non-contrast
Comparison: 07/29/2021

CLINICAL DATA: Unwitnessed fall

EXAM:
PORTABLE CHEST 1 VIEW

[x chest ap]
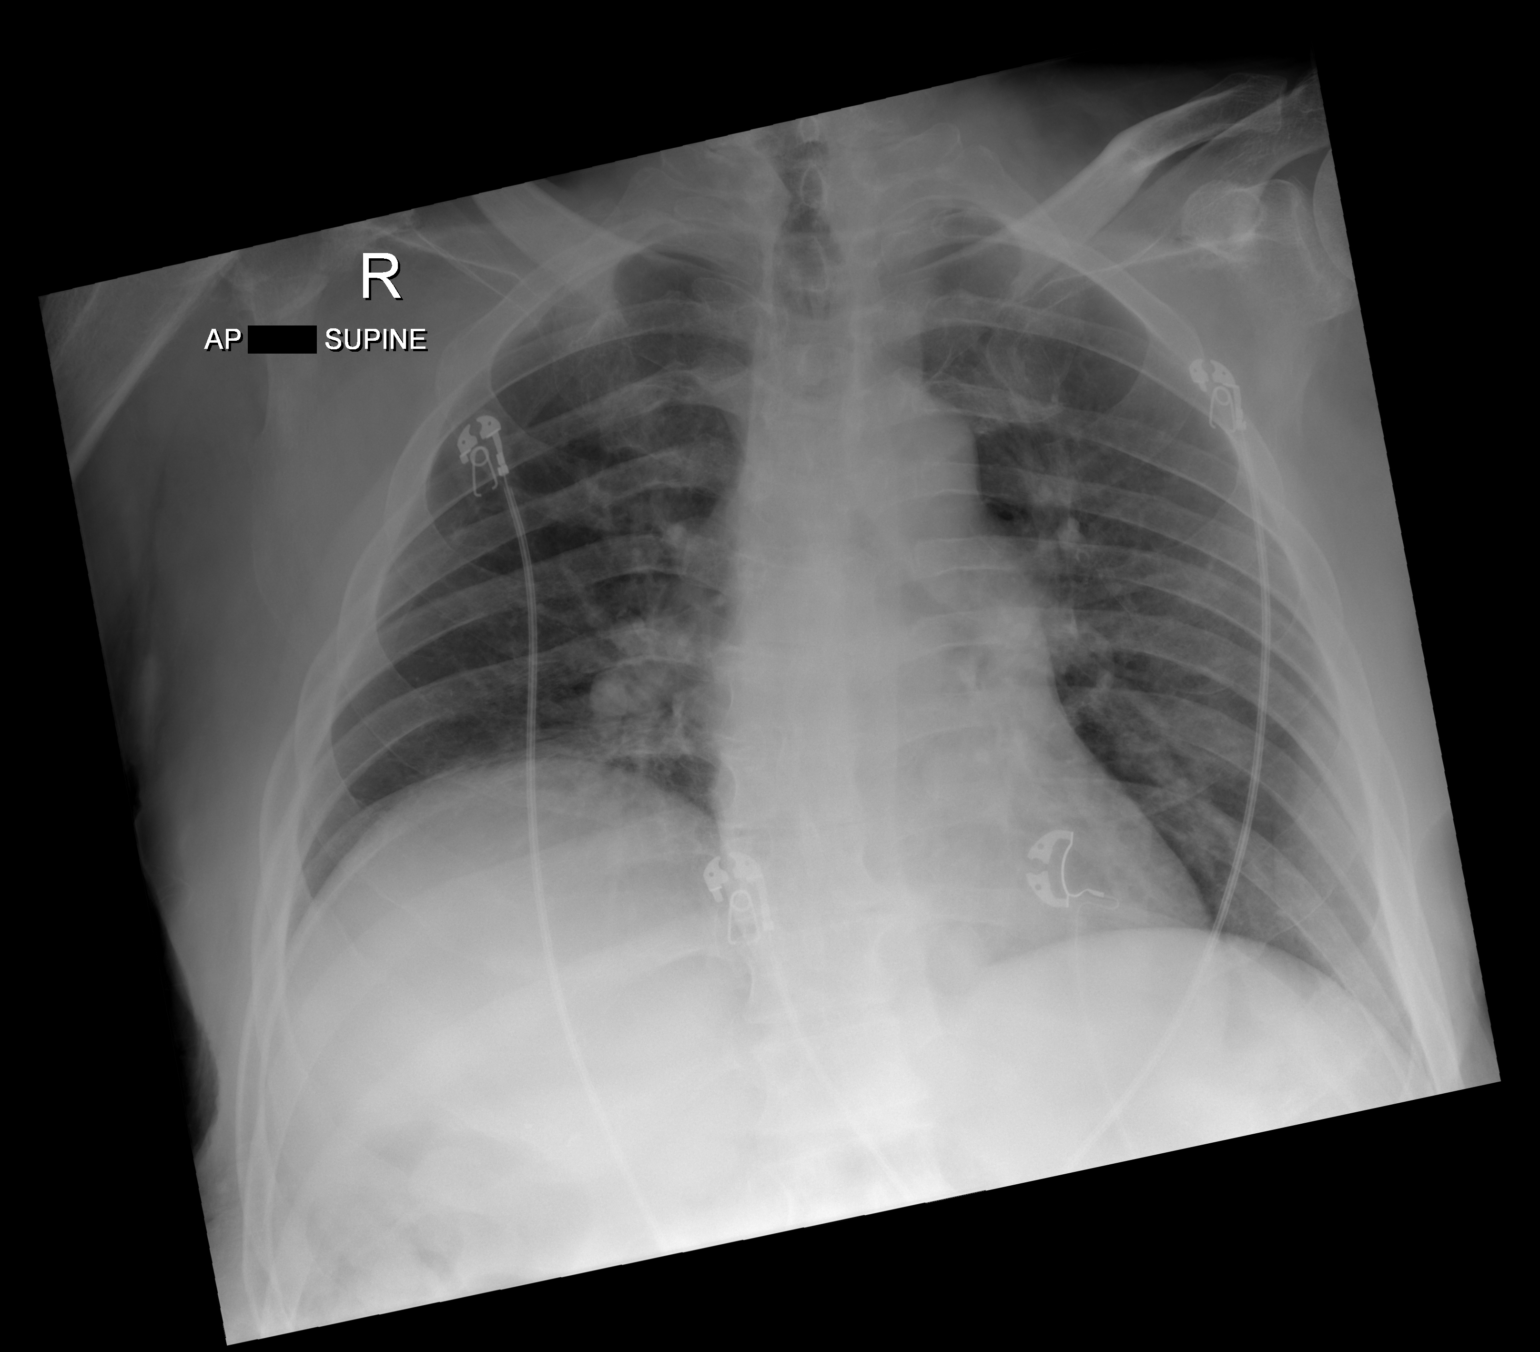

[1 of 1 positions shown; findings below may reference images not displayed]

FINDINGS: Mild elevation of the right hemidiaphragm. Right base atelectasis.
Left lung clear. Heart is normal size. No effusions or acute bony
abnormality.
IMPRESSION: Elevation of the right hemidiaphragm with right base atelectasis.

No active disease.

## 2022-06-10 IMAGING — CR DG PORTABLE PELVIS
1 series · 1 of 1 positions shown · non-contrast
Comparison: 07/29/2021

CLINICAL DATA: Unwitnessed fall

EXAM:
PORTABLE PELVIS 1-2 VIEWS

[x pelvis]
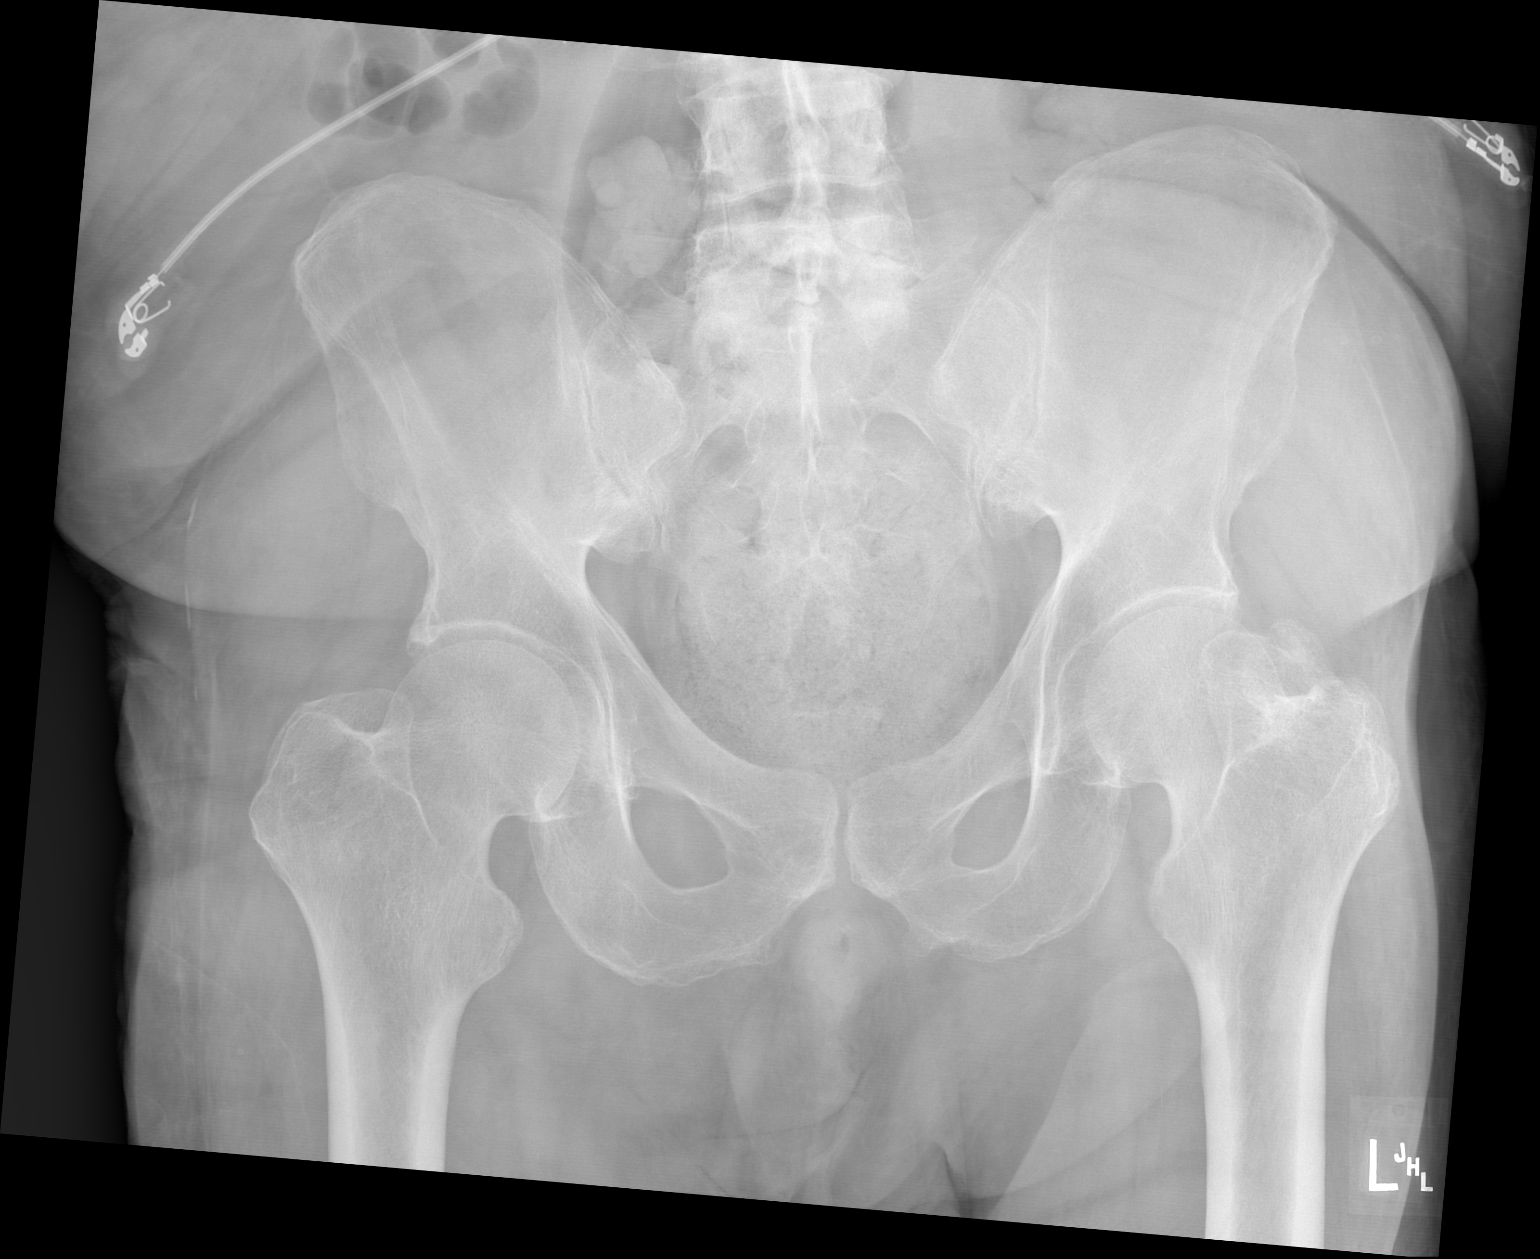

[1 of 1 positions shown; findings below may reference images not displayed]

FINDINGS: No acute bony abnormality. Specifically, no acute fracture,
subluxation, or dislocation. Hip joints and SI joints symmetric.
IMPRESSION: No acute bony abnormality.

## 2022-06-17 IMAGING — CT CT HEAD W/O CM
4 of 8 series · 13 of 47 positions shown, 14 images · non-contrast
Comparison: 05/09/2019

CLINICAL DATA: History of prior falls



[Series 3: head bone · axial · 0.51mm/px · z∈[+1436,+1508]mm · 4 of 98 slices shown]
[im 7/98  bone]
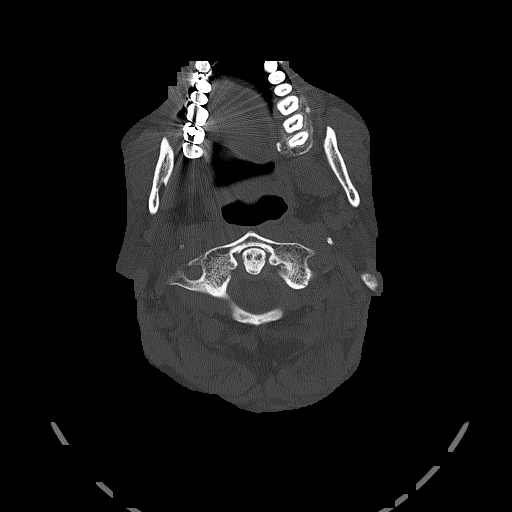
[im 19/98  bone]
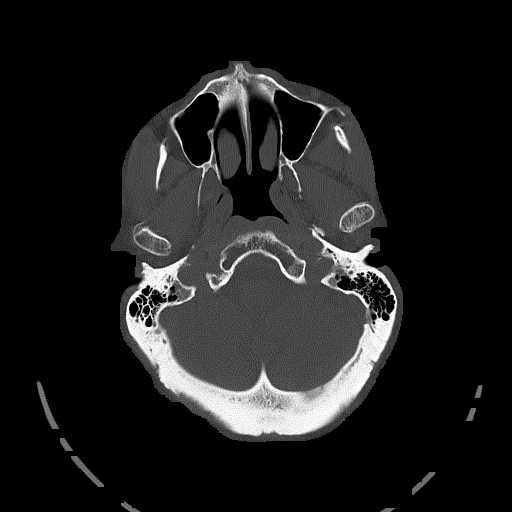
[im 31/98  bone]
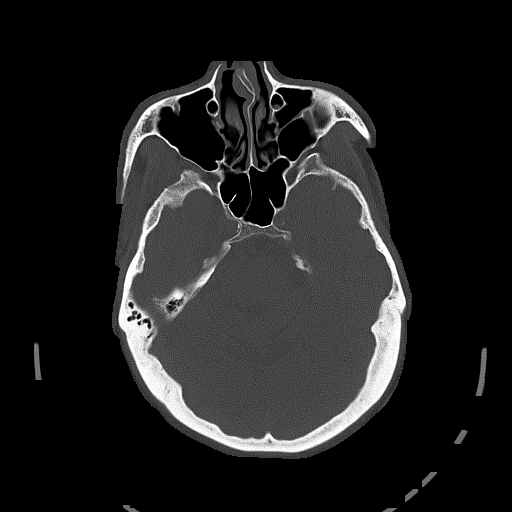
[im 43/98  bone]
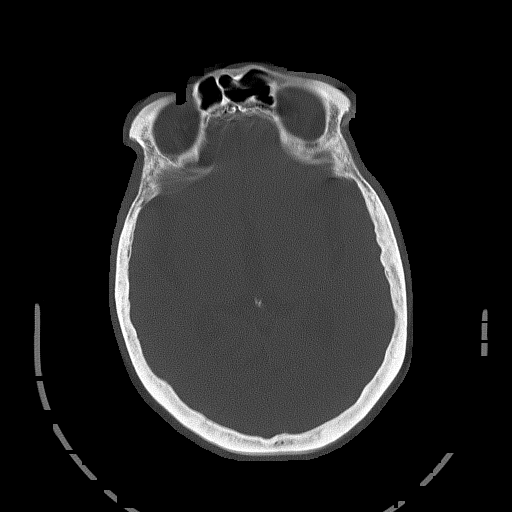

[Series 4: coronal soft tissue · coronal · 0.38mm/px · 2 of 74 slices shown]
[im 25/74  brain]
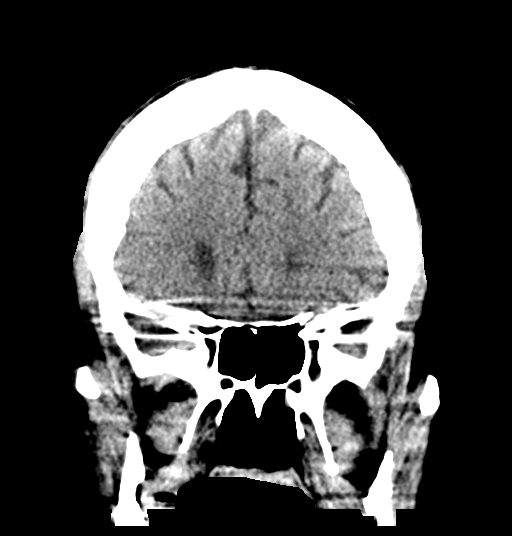
[im 49/74  brain]
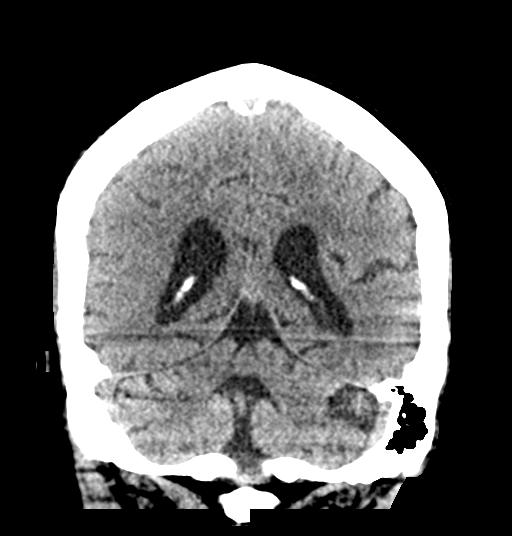

[Series 6: head wo · axial · 0.51mm/px · z∈[+1459,+1574]mm · 4 of 39 slices shown, 5 images]
[im 8/39  brain]
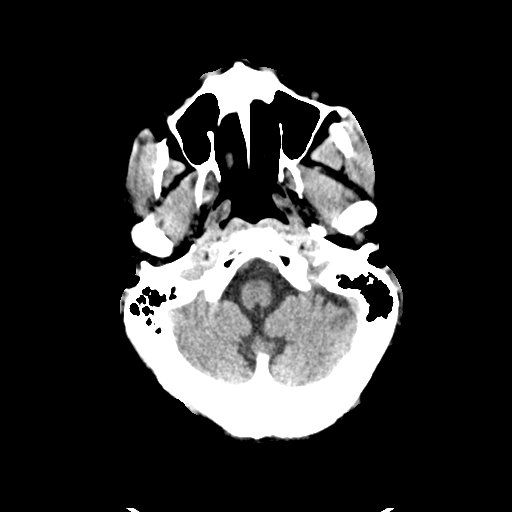
[im 8/39  bone]
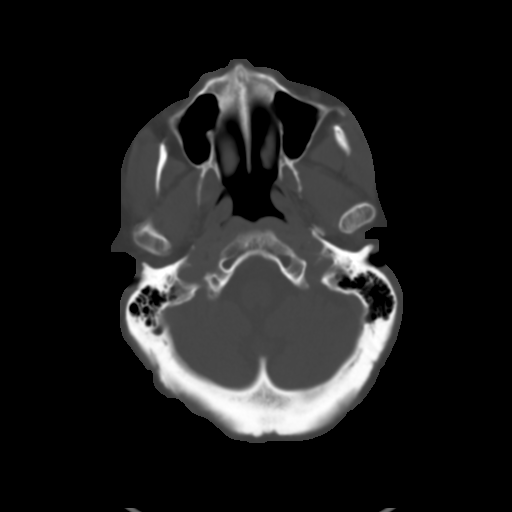
[im 16/39  brain]
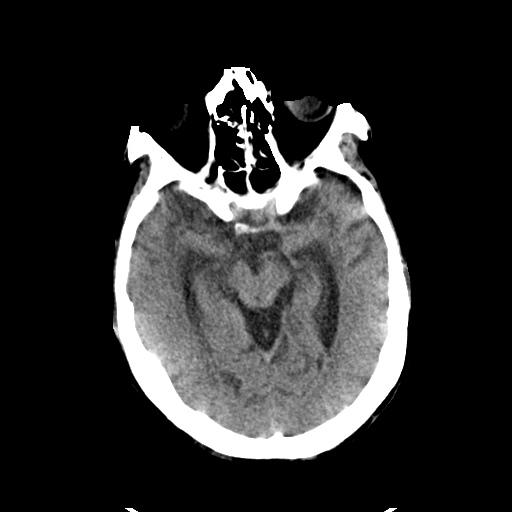
[im 23/39  brain]
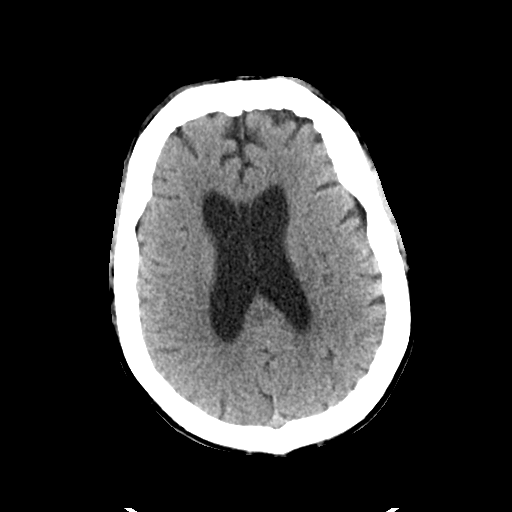
[im 31/39  brain]
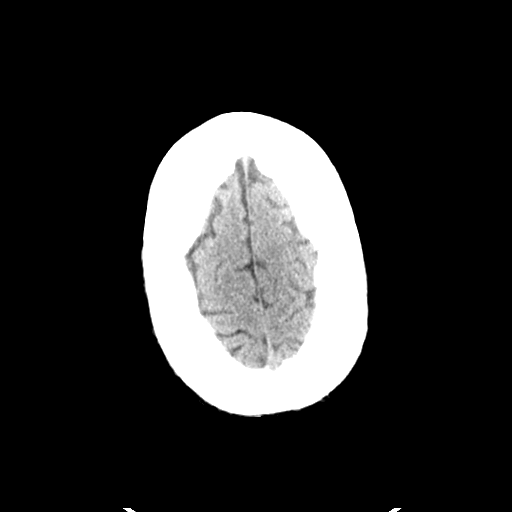

[Series 10: sagittal soft tissue · sagittal · 0.17mm/px · 3 of 64 slices shown]
[im 22/64  brain]
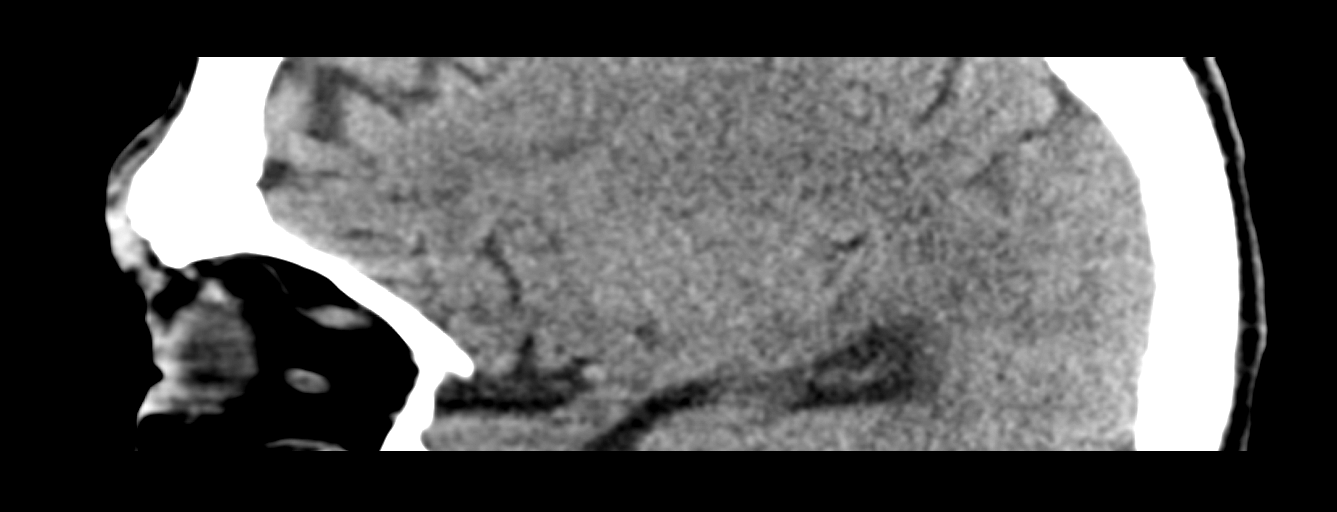
[im 32/64  brain]
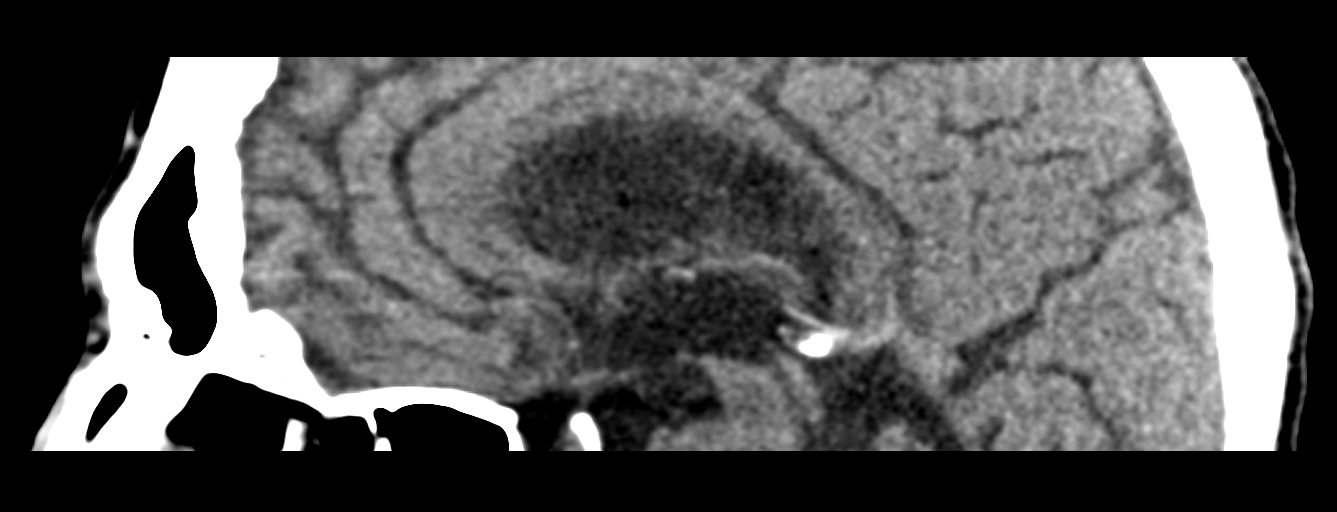
[im 43/64  brain]
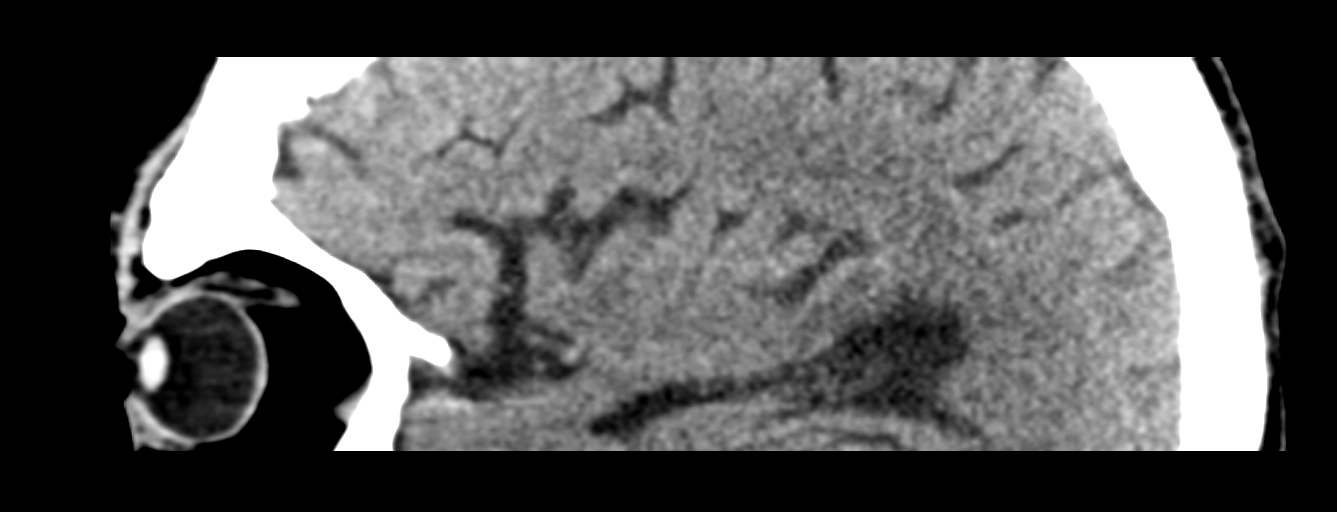

[13 of 47 positions shown; findings below may reference images not displayed]

FINDINGS: CT HEAD FINDINGS

Brain: No evidence of acute infarction, hemorrhage, hydrocephalus,
extra-axial collection or mass lesion/mass effect. Mild atrophic
changes and chronic white matter ischemic changes are seen.

Vascular: No hyperdense vessel or unexpected calcification.

Skull: Normal. Negative for fracture or focal lesion.

Other: None.

CT MAXILLOFACIAL FINDINGS

Osseous: Chronic nasal bone fractures are noted stable from prior
exams. No acute fracture is identified.

Orbits: Orbits and their contents are within normal limits.

Sinuses: Paranasal sinuses are within normal limits.

Soft tissues: No soft tissue abnormality is noted.

CT CERVICAL SPINE FINDINGS

Alignment: Within normal limits.

Skull base and vertebrae: Cervical segments are well visualized.
Vertebral body height is well maintained. Multilevel disc space
narrowing is noted with associated osteophytic changes. Multilevel
facet hypertrophic changes are noted as well. No acute fracture or
acute facet abnormality is noted.

Soft tissues and spinal canal: Surrounding soft tissue structures
are within normal limits.

Upper chest: Visualized lung apices are within normal limits.

Other: None
IMPRESSION: CT of the head: No acute intracranial abnormality noted. Mild
atrophic and chronic white matter ischemic changes are noted.

CT of the maxillofacial bones: Chronic nasal bone fractures. No
acute bony abnormality noted.

CT of the cervical spine: Multilevel degenerative change without
acute abnormality.

## 2022-06-17 IMAGING — CT CT CERVICAL SPINE W/O CM
3 of 4 series · 12 of 33 positions shown, 14 images · non-contrast
Comparison: 05/09/2019

CLINICAL DATA: History of prior falls



[Series 5: coronal bone · coronal · 0.27mm/px · 3 of 69 slices shown]
[im 14/69  bone]
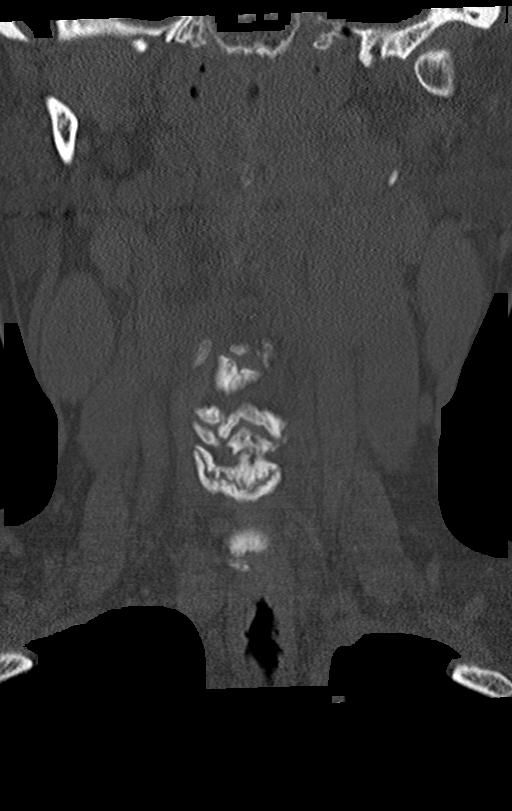
[im 28/69  bone]
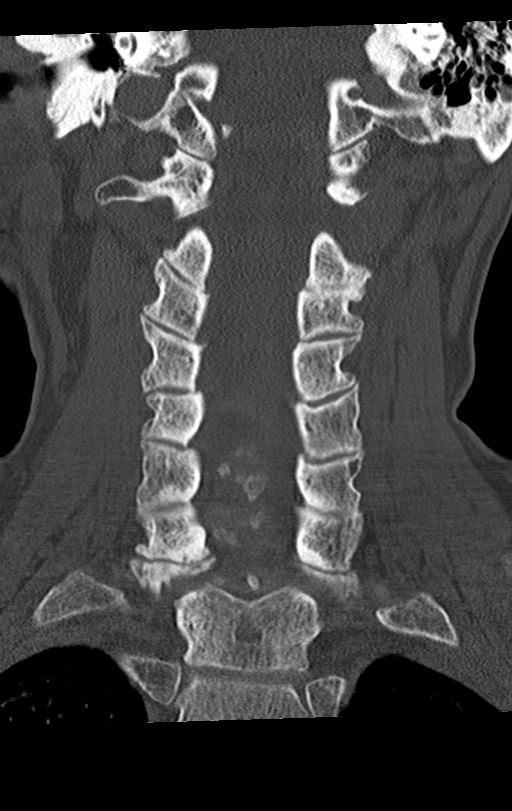
[im 41/69  bone]
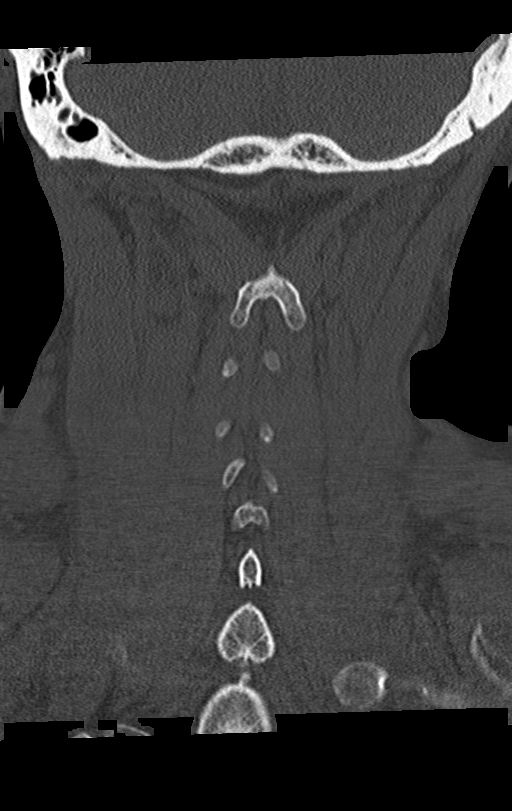

[Series 6: sagittal bone · sagittal · 0.27mm/px · 5 of 69 slices shown, 6 images]
[im 23/69  bone]
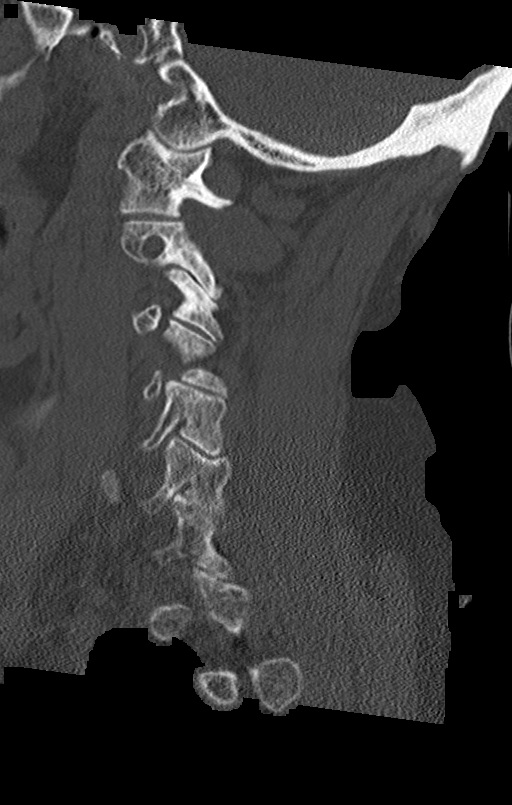
[im 29/69  bone]
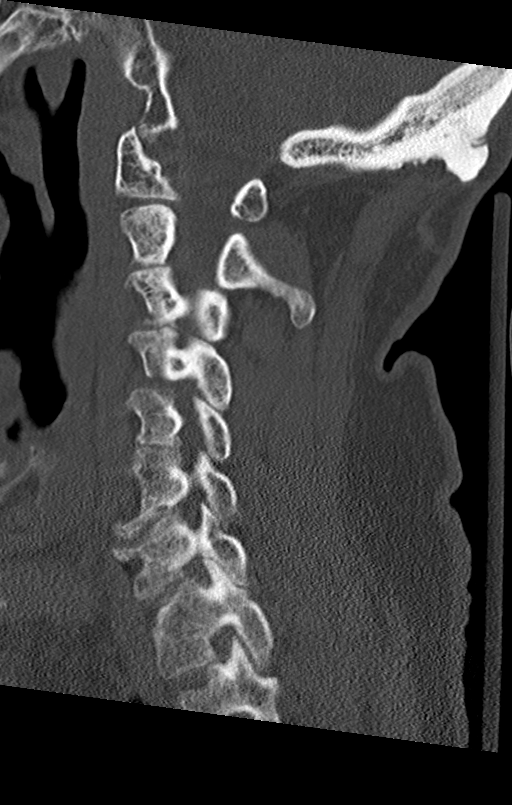
[im 35/69  soft-tissue]
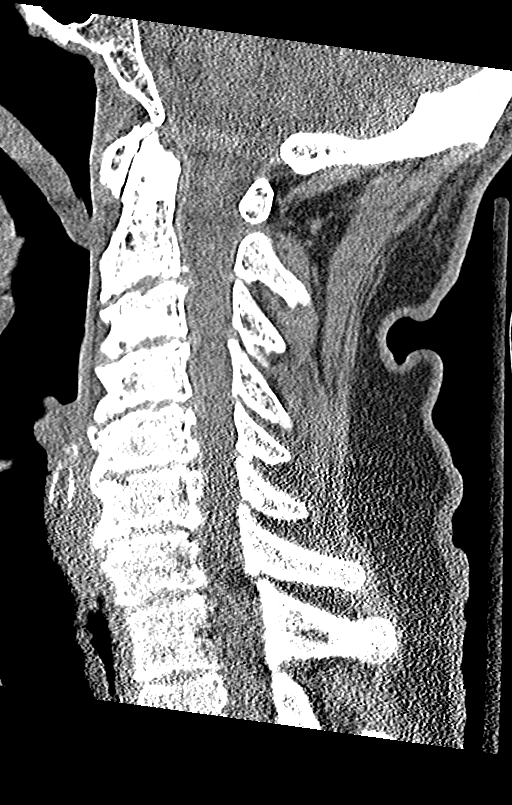
[im 35/69  bone]
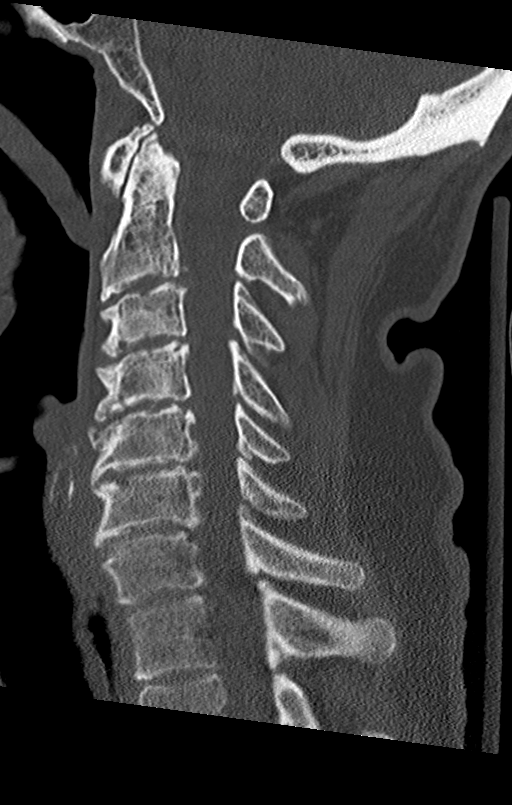
[im 40/69  bone]
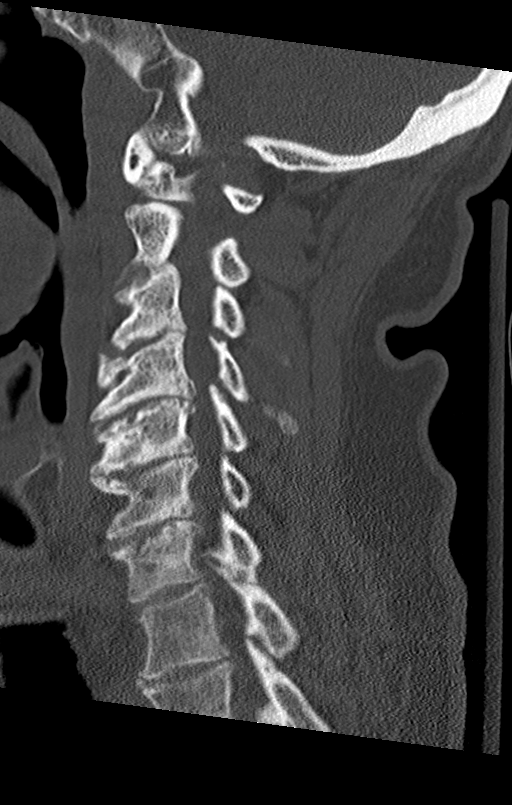
[im 46/69  bone]
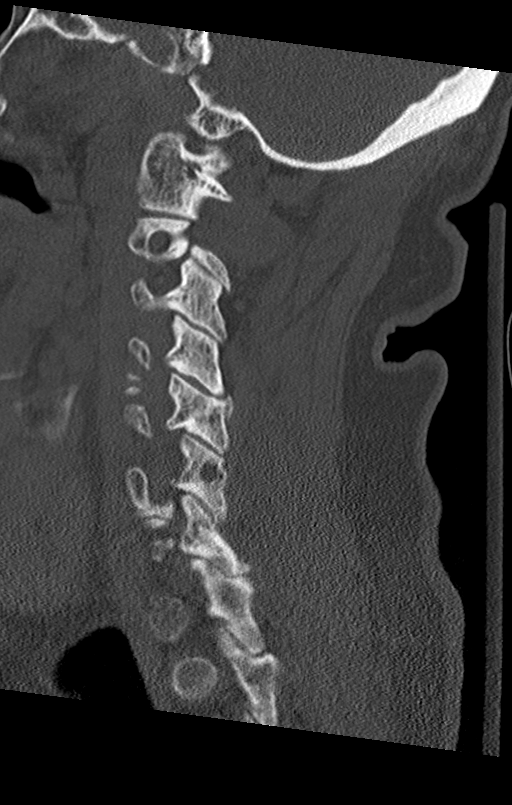

[Series 7: orthogonal axials · axial · 0.27mm/px · z∈[+1307,+1442]mm · 4 of 104 slices shown, 5 images]
[im 18/104  soft-tissue]
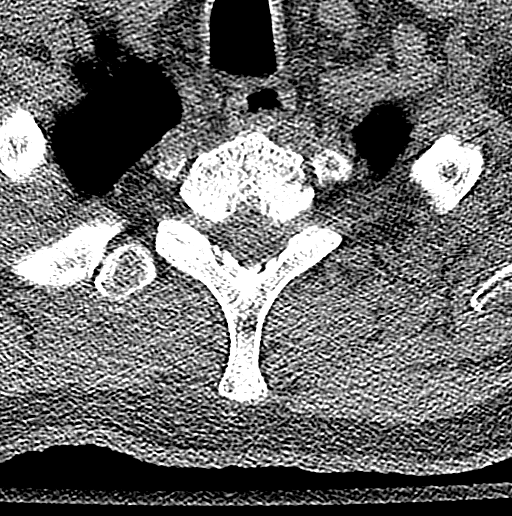
[im 18/104  bone]
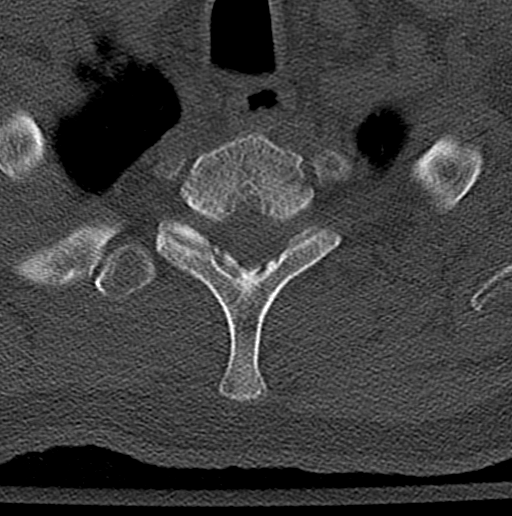
[im 35/104  bone]
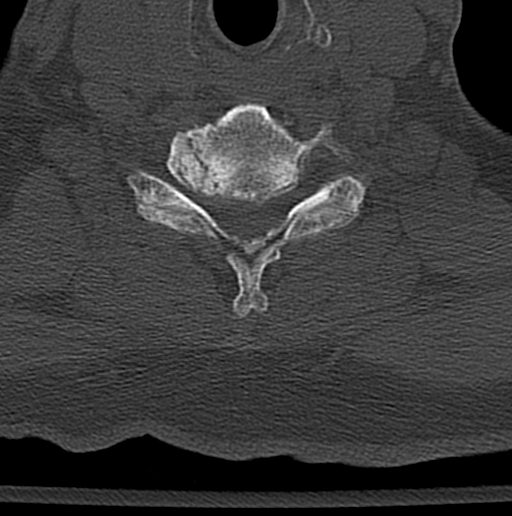
[im 69/104  bone]
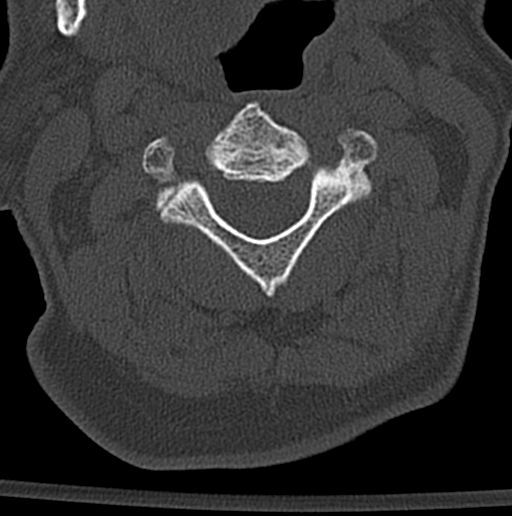
[im 86/104  bone]
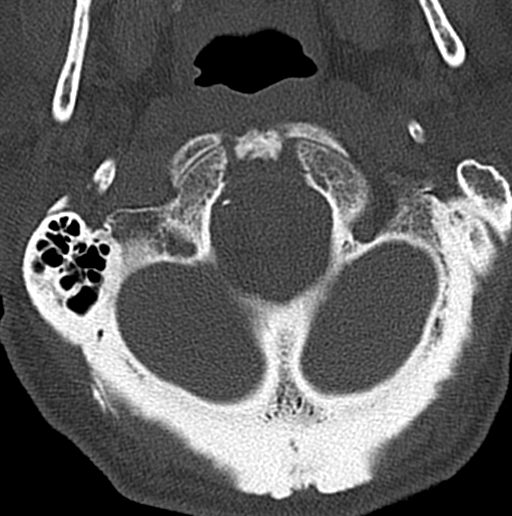

[12 of 33 positions shown; findings below may reference images not displayed]

FINDINGS: CT HEAD FINDINGS

Brain: No evidence of acute infarction, hemorrhage, hydrocephalus,
extra-axial collection or mass lesion/mass effect. Mild atrophic
changes and chronic white matter ischemic changes are seen.

Vascular: No hyperdense vessel or unexpected calcification.

Skull: Normal. Negative for fracture or focal lesion.

Other: None.

CT MAXILLOFACIAL FINDINGS

Osseous: Chronic nasal bone fractures are noted stable from prior
exams. No acute fracture is identified.

Orbits: Orbits and their contents are within normal limits.

Sinuses: Paranasal sinuses are within normal limits.

Soft tissues: No soft tissue abnormality is noted.

CT CERVICAL SPINE FINDINGS

Alignment: Within normal limits.

Skull base and vertebrae: Cervical segments are well visualized.
Vertebral body height is well maintained. Multilevel disc space
narrowing is noted with associated osteophytic changes. Multilevel
facet hypertrophic changes are noted as well. No acute fracture or
acute facet abnormality is noted.

Soft tissues and spinal canal: Surrounding soft tissue structures
are within normal limits.

Upper chest: Visualized lung apices are within normal limits.

Other: None
IMPRESSION: CT of the head: No acute intracranial abnormality noted. Mild
atrophic and chronic white matter ischemic changes are noted.

CT of the maxillofacial bones: Chronic nasal bone fractures. No
acute bony abnormality noted.

CT of the cervical spine: Multilevel degenerative change without
acute abnormality.
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
## 2022-06-27 ENCOUNTER — Emergency Department (HOSPITAL_COMMUNITY): Payer: Medicare Other

## 2022-06-27 ENCOUNTER — Other Ambulatory Visit: Payer: Self-pay

## 2022-06-27 ENCOUNTER — Emergency Department (HOSPITAL_COMMUNITY)
Admission: EM | Admit: 2022-06-27 | Discharge: 2022-06-27 | Disposition: A | Discharge status: Home / Self Care | Payer: Medicare Other | Attending: Emergency Medicine | Admitting: Emergency Medicine

## 2022-06-27 DIAGNOSIS — Z91040 Latex allergy status: Secondary | ICD-10-CM | POA: Insufficient documentation

## 2022-06-27 DIAGNOSIS — S0031XA Abrasion of nose, initial encounter: Secondary | ICD-10-CM | POA: Insufficient documentation

## 2022-06-27 DIAGNOSIS — Z7982 Long term (current) use of aspirin: Secondary | ICD-10-CM | POA: Diagnosis not present

## 2022-06-27 DIAGNOSIS — W1839XA Other fall on same level, initial encounter: Secondary | ICD-10-CM | POA: Diagnosis not present

## 2022-06-27 DIAGNOSIS — S0992XA Unspecified injury of nose, initial encounter: Secondary | ICD-10-CM | POA: Diagnosis present

## 2022-06-27 DIAGNOSIS — S0990XA Unspecified injury of head, initial encounter: Secondary | ICD-10-CM | POA: Diagnosis not present

## 2022-06-27 DIAGNOSIS — W19XXXA Unspecified fall, initial encounter: Secondary | ICD-10-CM

## 2022-06-27 MED ORDER — OLANZAPINE 5 MG PO TABS
7.5000 mg | ORAL_TABLET | Freq: Once | ORAL | Status: AC
  Administered 2022-06-27: 7.5 mg via ORAL
  Filled 2022-06-27: qty 2

## 2022-06-27 MED ORDER — ACETAMINOPHEN 325 MG PO TABS
650.0000 mg | ORAL_TABLET | Freq: Once | ORAL | Status: AC
  Administered 2022-06-27: 650 mg via ORAL
  Filled 2022-06-27: qty 2

## 2022-06-27 MED ORDER — LORAZEPAM 2 MG/ML IJ SOLN
INTRAMUSCULAR | Status: AC
  Filled 2022-06-27: qty 1
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
## 2022-06-27 NOTE — Discharge Instructions (Addendum)
Call your primary care doctor or specialist as discussed in the next 2-3 days.   Return immediately back to the ER if:  Your symptoms worsen within the next 12-24 hours. You develop new symptoms such as new fevers, persistent vomiting, new pain, shortness of breath, or new weakness or numbness, or if you have any other concerns.  

## 2022-06-27 NOTE — ED Notes (Signed)
RN attempted to give report to [facility].
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
## 2022-06-27 NOTE — ED Notes (Signed)
RN turned around and found pt standing up in bed walking towards the bathroom. RN and tech walked over to pt to assist in walking to the bathroom. Upon RN getting to pt pt started to run head first into the bathroom. RN was able to help lower pt to the ground prior to pt falling. Pt lowered to the ground. MD notified at time of assisted fall to ground. MD cleared pt for discharge. No complaints at this time. Pt given food and drinks.

## 2022-09-13 ENCOUNTER — Emergency Department (HOSPITAL_COMMUNITY)
Admission: EM | Admit: 2022-09-13 | Discharge: 2022-09-13 | Disposition: A | Discharge status: Skilled Nursing Facility | Payer: 59 | Attending: Emergency Medicine | Admitting: Emergency Medicine

## 2022-09-13 ENCOUNTER — Emergency Department (HOSPITAL_COMMUNITY): Payer: 59

## 2022-09-13 DIAGNOSIS — Z7982 Long term (current) use of aspirin: Secondary | ICD-10-CM | POA: Diagnosis not present

## 2022-09-13 DIAGNOSIS — E162 Hypoglycemia, unspecified: Secondary | ICD-10-CM | POA: Insufficient documentation

## 2022-09-13 DIAGNOSIS — S0591XA Unspecified injury of right eye and orbit, initial encounter: Secondary | ICD-10-CM | POA: Diagnosis present

## 2022-09-13 DIAGNOSIS — W01198A Fall on same level from slipping, tripping and stumbling with subsequent striking against other object, initial encounter: Secondary | ICD-10-CM | POA: Insufficient documentation

## 2022-09-13 DIAGNOSIS — S0011XA Contusion of right eyelid and periocular area, initial encounter: Secondary | ICD-10-CM | POA: Diagnosis not present

## 2022-09-13 DIAGNOSIS — W19XXXA Unspecified fall, initial encounter: Secondary | ICD-10-CM

## 2022-09-13 DIAGNOSIS — F0390 Unspecified dementia without behavioral disturbance: Secondary | ICD-10-CM | POA: Insufficient documentation

## 2022-09-13 DIAGNOSIS — Z91040 Latex allergy status: Secondary | ICD-10-CM | POA: Diagnosis not present

## 2022-09-13 LAB — I-STAT CHEM 8, ED
BUN: 17 mg/dL (ref 6–20)
Calcium, Ion: 1.15 mmol/L (ref 1.15–1.40)
Chloride: 105 mmol/L (ref 98–111)
Creatinine, Ser: 0.7 mg/dL (ref 0.61–1.24)
Glucose, Bld: 60 mg/dL — ABNORMAL LOW (ref 70–99)
HCT: 41 % (ref 39.0–52.0)
Hemoglobin: 13.9 g/dL (ref 13.0–17.0)
Potassium: 4.3 mmol/L (ref 3.5–5.1)
Sodium: 143 mmol/L (ref 135–145)
TCO2: 28 mmol/L (ref 22–32)

## 2022-09-13 LAB — CBG MONITORING, ED
Glucose-Capillary: 74 mg/dL (ref 70–99)
Glucose-Capillary: 60 mg/dL — ABNORMAL LOW (ref 70–99)
Glucose-Capillary: 126 mg/dL — ABNORMAL HIGH (ref 70–99)

## 2022-09-13 MED ORDER — GLUCOSE 40 % PO GEL
1.0000 | Freq: Once | ORAL | Status: AC
  Administered 2022-09-13: 31 g via ORAL
  Filled 2022-09-13: qty 1
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
## 2022-09-13 NOTE — ED Provider Notes (Signed)
[place] EMERGENCY DEPARTMENT Provider Note   CSN: [id number] Arrival date & time:        History  Chief Complaint  Patient presents with   Fall    [name] is a 56 y.o. male.  56 year old male with a history of TBI and frontal lobe syndrome, atrial fibrillation, Addison's disease, dementia presents to the emergency department from [facility] after a mechanical fall.  Patient struck his head on the wall during the fall.  He was not wearing his helmet at the time.  The patient is not on chronic anticoagulation.  He is noted to have bruising to the right upper eyelid as well as some petechiae to the anterior R shoulder. Patient c/o pain to RUE at this time. Unable to expand on complaint due to baseline mental d/o.   Fall       Home Medications Prior to Admission medications   Medication Sig Start Date End Date Taking? Authorizing Provider  acetaminophen (TYLENOL) 325 MG tablet Take 650 mg by mouth every 4 (four) hours as needed (pain).    [provider]  albuterol (ACCUNEB) 0.63 MG/3ML nebulizer solution Take 1 ampule by nebulization every 6 (six) hours as needed for wheezing or shortness of breath (COPD).    [provider]  aspirin 81 MG chewable tablet Chew 81 mg by mouth every morning. 0900    [provider]  Carboxymeth-Glycerin-Polysorb (REFRESH OPTIVE ADVANCED) 0.5-1-0.5 % SOLN Place 1 drop into both eyes 3 (three) times daily. 0900, 1400, 2100    [provider]  cephALEXin (KEFLEX) 500 MG capsule Take 1 capsule (500 mg total) by mouth 4 (four) times daily. Patient not taking: Reported on 05/24/2022 05/15/22   Daleen Bo, MD  clonazePAM (KLONOPIN) 0.5 MG tablet Take 0.5 mg by mouth 2 (two) times daily. 0830 and 1630    [provider]  divalproex (DEPAKOTE SPRINKLE) 125 MG capsule Take 500 mg by mouth 3 (three) times daily. 0830, 1230, and 2030 07/18/13   [provider]  docusate sodium  (COLACE) 100 MG capsule Take 100 mg by mouth every morning. 0800    [provider]  fosfomycin (MONUROL) 3 g PACK Take 3 g by mouth See admin instructions. Give 3 g packet by mouth one time a day for UTI for one day  0900    [provider]  haloperidol (HALDOL) 5 MG tablet Take 5 mg by mouth 2 (two) times daily. 0830 and 2030    [provider]  levothyroxine (SYNTHROID) 200 MCG tablet Take 200 mcg by mouth daily before breakfast. Take with a 25 mcg tablet for a total dose of 225 mcg every morning 0630    [provider]  levothyroxine (SYNTHROID) 25 MCG tablet Take 25 mcg by mouth daily before breakfast. Give 19mcg with current 200 mcg dose for 225 mcg 0630    [provider]  Magnesium Hydroxide (MILK OF MAGNESIA PO) Take 30 mLs by mouth daily as needed (constipation).    [provider]  pantoprazole (PROTONIX) 40 MG tablet Take 40 mg by mouth daily. 0700    [provider]  pantoprazole sodium (PROTONIX) 40 mg/20 mL PACK Take 40 mg by mouth every morning. Patient not taking: Reported on 05/24/2022 11/30/11   Ernest Haber, MD  polyethylene glycol [facility] / GLYCOLAX) packet Take 17 g by mouth 2 (two) times daily. 0900 and 2100    [provider]  senna (SENOKOT) 8.6 MG tablet  Take 2 tablets by mouth 2 (two) times daily. 0900 and 2000    [provider]  sertraline (ZOLOFT) 50 MG tablet Take 75 mg by mouth every morning. 0900    [provider]  traMADol (ULTRAM) 50 MG tablet Take 50 mg by mouth every 6 (six) hours as needed (pain).    [provider]      Allergies    Ativan [lorazepam], Latex, Morphine and related, Other, Penicillins, Pyridium [phenazopyridine hcl], Soy allergy, and Soybean (diagnostic)    Review of Systems   Review of Systems  Unable to perform ROS: Dementia    Physical Exam Updated Vital Signs BP 104/76 (BP Location: Left Arm)   Pulse 81   Temp 99 F (37.2 C)  (Oral)   Resp 16   SpO2 98%   Physical Exam Vitals and nursing note reviewed.  Constitutional:      General: He is not in acute distress.    Appearance: He is well-developed. He is not diaphoretic.     Comments: Nontoxic appearing  HENT:     Head: Normocephalic.     Comments: Helmet in place Eyes:     General: No scleral icterus.    Extraocular Movements: Extraocular movements intact.     Conjunctiva/sclera: Conjunctivae normal.     Comments: Contusion to the right lateral periorbital region with swelling of the R upper eyelid.  Cardiovascular:     Rate and Rhythm: Normal rate and regular rhythm.     Pulses: Normal pulses.     Comments: Distal radial pulse 2+ b/l Pulmonary:     Effort: Pulmonary effort is normal. No respiratory distress.     Comments: Respirations even and unlabored Musculoskeletal:     Cervical back: Normal range of motion.     Comments: Difficult exam due to patient's baseline metal status. Question deformity of the R shoulder. No crepitus or swelling present.   Skin:    General: Skin is warm and dry.     Coloration: Skin is not pale.     Findings: No erythema or rash.  Neurological:     Mental Status: He is alert.     Comments: Transitioning in bed without assistance.  Psychiatric:        Behavior: Behavior normal.     ED Results / Procedures / Treatments   Labs (all labs ordered are listed, but only abnormal results are displayed) Labs Reviewed  I-STAT CHEM 8, ED - Abnormal; Notable for the following components:      Result Value   Glucose, Bld 60 (*)    All other components within normal limits  CBG MONITORING, ED - Abnormal; Notable for the following components:   Glucose-Capillary 60 (*)    All other components within normal limits  CBG MONITORING, ED - Abnormal; Notable for the following components:   Glucose-Capillary 126 (*)    All other components within normal limits  CBG MONITORING, ED    EKG None  Radiology CT HEAD WO  CONTRAST (5MM)  Result Date: 09/13/2022 CLINICAL DATA:  Fall EXAM: CT HEAD WITHOUT CONTRAST CT CERVICAL SPINE WITHOUT CONTRAST TECHNIQUE: Multidetector CT imaging of the head and cervical spine was performed following the standard protocol without intravenous contrast. Multiplanar CT image reconstructions of the cervical spine were also generated. RADIATION DOSE REDUCTION: This exam was performed according to the departmental dose-optimization program which includes automated exposure control, adjustment of the mA and/or kV according to patient size and/or use of iterative reconstruction technique. COMPARISON:  None Available. FINDINGS: CT HEAD FINDINGS Brain: There is no mass, hemorrhage or extra-axial collection. There is generalized atrophy without lobar predilection. The brain parenchyma is normal, without evidence of acute or chronic infarction. Vascular: No abnormal hyperdensity of the major intracranial arteries or dural venous sinuses. No intracranial atherosclerosis. Skull: The visualized skull base, calvarium and extracranial soft tissues are normal. Sinuses/Orbits: No fluid levels or advanced mucosal thickening of the visualized paranasal sinuses. No mastoid or middle ear effusion. The orbits are normal. CT CERVICAL SPINE FINDINGS Alignment: No static subluxation. Facets are aligned. Occipital condyles are normally positioned. Skull base and vertebrae: No acute fracture. Soft tissues and spinal canal: No prevertebral fluid or swelling. No visible canal hematoma. Disc levels: No advanced spinal canal or neural foraminal stenosis. Upper chest: No pneumothorax, pulmonary nodule or pleural effusion. Other: Normal visualized paraspinal cervical soft tissues. IMPRESSION: 1. No acute intracranial abnormality. 2. No acute fracture or static subluxation of the cervical spine. Electronically Signed   By: Ulyses Jarred M.D.   On: 09/13/2022 02:12   CT Cervical Spine Wo Contrast  Result Date:  09/13/2022 CLINICAL DATA:  Fall EXAM: CT HEAD WITHOUT CONTRAST CT CERVICAL SPINE WITHOUT CONTRAST TECHNIQUE: Multidetector CT imaging of the head and cervical spine was performed following the standard protocol without intravenous contrast. Multiplanar CT image reconstructions of the cervical spine were also generated. RADIATION DOSE REDUCTION: This exam was performed according to the departmental dose-optimization program which includes automated exposure control, adjustment of the mA and/or kV according to patient size and/or use of iterative reconstruction technique. COMPARISON:  None Available. FINDINGS: CT HEAD FINDINGS Brain: There is no mass, hemorrhage or extra-axial collection. There is generalized atrophy without lobar predilection. The brain parenchyma is normal, without evidence of acute or chronic infarction. Vascular: No abnormal hyperdensity of the major intracranial arteries or dural venous sinuses. No intracranial atherosclerosis. Skull: The visualized skull base, calvarium and extracranial soft tissues are normal. Sinuses/Orbits: No fluid levels or advanced mucosal thickening of the visualized paranasal sinuses. No mastoid or middle ear effusion. The orbits are normal. CT CERVICAL SPINE FINDINGS Alignment: No static subluxation. Facets are aligned. Occipital condyles are normally positioned. Skull base and vertebrae: No acute fracture. Soft tissues and spinal canal: No prevertebral fluid or swelling. No visible canal hematoma. Disc levels: No advanced spinal canal or neural foraminal stenosis. Upper chest: No pneumothorax, pulmonary nodule or pleural effusion. Other: Normal visualized paraspinal cervical soft tissues. IMPRESSION: 1. No acute intracranial abnormality. 2. No acute fracture or static subluxation of the cervical spine. Electronically Signed   By: Ulyses Jarred M.D.   On: 09/13/2022 02:12   DG Shoulder Right  Result Date: 09/13/2022 CLINICAL DATA:  Fall. EXAM: RIGHT SHOULDER - 2+  VIEW COMPARISON:  Right shoulder x-ray 05/08/2022 FINDINGS: Chronic healed displaced humeral neck fracture appears unchanged from prior. No definite acute fracture seen. No dislocation identified. No significant interval change. IMPRESSION: 1. Healed proximal right humeral fracture deformity appears similar to the prior examination. 2. No definite acute fracture or dislocation. 3. If there is high clinical concern for occult acute fracture, consider further evaluation with CT. Electronically Signed   By: Ronney Asters M.D.   On: 09/13/2022 01:46    Procedures Procedures    Medications Ordered in ED Medications  dextrose (GLUTOSE) oral gel 40% (31 g Oral Given 09/13/22 2536)    ED Course/ Medical Decision Making/ A&P Clinical Course as of 09/13/22 0418  Sun Sep 13, 2022  0300 Call placed to  Legal Guardian, [name], without answer. [KH]    Clinical Course User Index [KH] Antonietta Breach, PA-C                           Medical Decision Making Amount and/or Complexity of Data Reviewed Radiology: ordered.  Risk OTC drugs.   This patient presents to the ED for concern of mechanical fall, this involves an extensive number of treatment options, and is a complaint that carries with it a high risk of complications and morbidity.  The differential diagnosis includes skull fx vs ICH vs contusion   Co morbidities that complicate the patient evaluation  TBI Afib Dementia    Additional history obtained:  Additional history obtained from EMS External records from outside source obtained and reviewed including prior head CT from 06/2022   Lab Tests:  I Ordered, and personally interpreted labs.  The pertinent results include:  hypoglycemia of 60   Imaging Studies ordered:  I ordered imaging studies including CT head and C-spine, Xray R shoulder  I independently visualized and interpreted imaging which showed no acute traumatic pathology. I agree with the radiologist  interpretation   Medicines ordered and prescription drug management:  I ordered medication including dextrose gel for hypoglycemia  Reevaluation of the patient after these medicines showed that the patient improved I have reviewed the patients home medicines and have made adjustments as needed   Test Considered:  EKG   Reevaluation:  After the interventions noted above, I reevaluated the patient and found that they have : remained stable   Social Determinants of Health:  Insured patient SNF resident   Dispostion:  After consideration of the diagnostic results and the patients response to treatment, I feel that the patent would benefit from PCP recheck PRN. Evaluation in the ED reassuring. No evidence of acute traumatic pathology on work up. Plan for transport back to SNF via [facility].         Final Clinical Impression(s) / ED Diagnoses Final diagnoses:  Fall, initial encounter    Rx / DC Orders ED Discharge Orders     None         Antonietta Breach, PA-C 09/13/22 0418    Merryl Hacker, MD 09/13/22 [phone]

## 2022-09-13 NOTE — ED Notes (Signed)
Attempted to call facility for report, no answer, no vm

## 2022-09-13 NOTE — ED Notes (Signed)
Patient was given some sips of orange juice.

## 2022-09-13 NOTE — ED Notes (Signed)
Attempted to call report to [facility], no answer, phone rings until will not ring anymore, no VM

## 2022-09-13 NOTE — Discharge Instructions (Signed)
Your evaluation was reassuring today. Continue your daily medications. Return for new or concerning symptoms.

## 2022-09-13 NOTE — ED Notes (Signed)
Attempted to call report to [facility] at [phone], no answer, no vm

## 2022-10-21 ENCOUNTER — Inpatient Hospital Stay (HOSPITAL_COMMUNITY)
Admission: EM | Admit: 2022-10-21 | Discharge: 2022-11-13 | DRG: 853 | Disposition: A | Discharge status: Skilled Nursing Facility | Payer: 59 | Source: Skilled Nursing Facility | Attending: Internal Medicine | Admitting: Internal Medicine

## 2022-10-21 ENCOUNTER — Emergency Department (HOSPITAL_COMMUNITY): Payer: 59

## 2022-10-21 DIAGNOSIS — G9341 Metabolic encephalopathy: Secondary | ICD-10-CM | POA: Diagnosis present

## 2022-10-21 DIAGNOSIS — L02211 Cutaneous abscess of abdominal wall: Secondary | ICD-10-CM | POA: Diagnosis present

## 2022-10-21 DIAGNOSIS — E46 Unspecified protein-calorie malnutrition: Secondary | ICD-10-CM | POA: Diagnosis present

## 2022-10-21 DIAGNOSIS — E039 Hypothyroidism, unspecified: Secondary | ICD-10-CM | POA: Diagnosis present

## 2022-10-21 DIAGNOSIS — Z7401 Bed confinement status: Secondary | ICD-10-CM

## 2022-10-21 DIAGNOSIS — Z885 Allergy status to narcotic agent status: Secondary | ICD-10-CM

## 2022-10-21 DIAGNOSIS — Z91018 Allergy to other foods: Secondary | ICD-10-CM

## 2022-10-21 DIAGNOSIS — R0602 Shortness of breath: Secondary | ICD-10-CM | POA: Diagnosis present

## 2022-10-21 DIAGNOSIS — G40909 Epilepsy, unspecified, not intractable, without status epilepticus: Secondary | ICD-10-CM | POA: Diagnosis present

## 2022-10-21 DIAGNOSIS — J9602 Acute respiratory failure with hypercapnia: Secondary | ICD-10-CM | POA: Diagnosis present

## 2022-10-21 DIAGNOSIS — E874 Mixed disorder of acid-base balance: Secondary | ICD-10-CM | POA: Diagnosis present

## 2022-10-21 DIAGNOSIS — E440 Moderate protein-calorie malnutrition: Secondary | ICD-10-CM | POA: Diagnosis present

## 2022-10-21 DIAGNOSIS — Z91040 Latex allergy status: Secondary | ICD-10-CM

## 2022-10-21 DIAGNOSIS — E271 Primary adrenocortical insufficiency: Secondary | ICD-10-CM | POA: Diagnosis present

## 2022-10-21 DIAGNOSIS — Z87440 Personal history of urinary (tract) infections: Secondary | ICD-10-CM

## 2022-10-21 DIAGNOSIS — J690 Pneumonitis due to inhalation of food and vomit: Secondary | ICD-10-CM | POA: Diagnosis not present

## 2022-10-21 DIAGNOSIS — E785 Hyperlipidemia, unspecified: Secondary | ICD-10-CM | POA: Diagnosis present

## 2022-10-21 DIAGNOSIS — A419 Sepsis, unspecified organism: Secondary | ICD-10-CM | POA: Diagnosis present

## 2022-10-21 DIAGNOSIS — R64 Cachexia: Secondary | ICD-10-CM | POA: Diagnosis present

## 2022-10-21 DIAGNOSIS — F0394 Unspecified dementia, unspecified severity, with anxiety: Secondary | ICD-10-CM | POA: Diagnosis present

## 2022-10-21 DIAGNOSIS — G931 Anoxic brain damage, not elsewhere classified: Secondary | ICD-10-CM | POA: Diagnosis present

## 2022-10-21 DIAGNOSIS — Z20822 Contact with and (suspected) exposure to covid-19: Secondary | ICD-10-CM | POA: Diagnosis present

## 2022-10-21 DIAGNOSIS — I255 Ischemic cardiomyopathy: Secondary | ICD-10-CM | POA: Diagnosis present

## 2022-10-21 DIAGNOSIS — Y95 Nosocomial condition: Secondary | ICD-10-CM | POA: Diagnosis present

## 2022-10-21 DIAGNOSIS — G8250 Quadriplegia, unspecified: Secondary | ICD-10-CM | POA: Diagnosis present

## 2022-10-21 DIAGNOSIS — J189 Pneumonia, unspecified organism: Secondary | ICD-10-CM | POA: Diagnosis present

## 2022-10-21 DIAGNOSIS — R627 Adult failure to thrive: Secondary | ICD-10-CM | POA: Diagnosis present

## 2022-10-21 DIAGNOSIS — Z79899 Other long term (current) drug therapy: Secondary | ICD-10-CM

## 2022-10-21 DIAGNOSIS — J9601 Acute respiratory failure with hypoxia: Secondary | ICD-10-CM | POA: Diagnosis present

## 2022-10-21 DIAGNOSIS — Z6826 Body mass index (BMI) 26.0-26.9, adult: Secondary | ICD-10-CM

## 2022-10-21 DIAGNOSIS — Z66 Do not resuscitate: Secondary | ICD-10-CM | POA: Diagnosis present

## 2022-10-21 DIAGNOSIS — J9690 Respiratory failure, unspecified, unspecified whether with hypoxia or hypercapnia: Secondary | ICD-10-CM | POA: Diagnosis present

## 2022-10-21 DIAGNOSIS — J9621 Acute and chronic respiratory failure with hypoxia: Secondary | ICD-10-CM | POA: Diagnosis not present

## 2022-10-21 DIAGNOSIS — I252 Old myocardial infarction: Secondary | ICD-10-CM

## 2022-10-21 DIAGNOSIS — R269 Unspecified abnormalities of gait and mobility: Secondary | ICD-10-CM | POA: Diagnosis present

## 2022-10-21 DIAGNOSIS — Z8674 Personal history of sudden cardiac arrest: Secondary | ICD-10-CM

## 2022-10-21 DIAGNOSIS — F05 Delirium due to known physiological condition: Secondary | ICD-10-CM | POA: Diagnosis not present

## 2022-10-21 DIAGNOSIS — I5033 Acute on chronic diastolic (congestive) heart failure: Secondary | ICD-10-CM | POA: Diagnosis present

## 2022-10-21 DIAGNOSIS — Z87820 Personal history of traumatic brain injury: Secondary | ICD-10-CM

## 2022-10-21 DIAGNOSIS — Z7189 Other specified counseling: Secondary | ICD-10-CM | POA: Diagnosis not present

## 2022-10-21 DIAGNOSIS — G9349 Other encephalopathy: Secondary | ICD-10-CM | POA: Diagnosis present

## 2022-10-21 DIAGNOSIS — R1310 Dysphagia, unspecified: Secondary | ICD-10-CM | POA: Diagnosis not present

## 2022-10-21 DIAGNOSIS — L8990 Pressure ulcer of unspecified site, unspecified stage: Secondary | ICD-10-CM | POA: Insufficient documentation

## 2022-10-21 DIAGNOSIS — K458 Other specified abdominal hernia without obstruction or gangrene: Secondary | ICD-10-CM | POA: Diagnosis present

## 2022-10-21 DIAGNOSIS — Z7982 Long term (current) use of aspirin: Secondary | ICD-10-CM

## 2022-10-21 DIAGNOSIS — Z789 Other specified health status: Secondary | ICD-10-CM

## 2022-10-21 DIAGNOSIS — K5909 Other constipation: Secondary | ICD-10-CM | POA: Diagnosis present

## 2022-10-21 DIAGNOSIS — G8929 Other chronic pain: Secondary | ICD-10-CM | POA: Diagnosis not present

## 2022-10-21 DIAGNOSIS — Z9181 History of falling: Secondary | ICD-10-CM

## 2022-10-21 DIAGNOSIS — K2970 Gastritis, unspecified, without bleeding: Secondary | ICD-10-CM | POA: Diagnosis present

## 2022-10-21 DIAGNOSIS — K219 Gastro-esophageal reflux disease without esophagitis: Secondary | ICD-10-CM | POA: Diagnosis present

## 2022-10-21 DIAGNOSIS — N179 Acute kidney failure, unspecified: Secondary | ICD-10-CM | POA: Diagnosis present

## 2022-10-21 DIAGNOSIS — R5381 Other malaise: Secondary | ICD-10-CM | POA: Diagnosis present

## 2022-10-21 DIAGNOSIS — I9589 Other hypotension: Secondary | ICD-10-CM | POA: Diagnosis present

## 2022-10-21 DIAGNOSIS — Z515 Encounter for palliative care: Secondary | ICD-10-CM

## 2022-10-21 DIAGNOSIS — I959 Hypotension, unspecified: Secondary | ICD-10-CM | POA: Diagnosis not present

## 2022-10-21 DIAGNOSIS — R1313 Dysphagia, pharyngeal phase: Secondary | ICD-10-CM | POA: Diagnosis present

## 2022-10-21 DIAGNOSIS — Z888 Allergy status to other drugs, medicaments and biological substances status: Secondary | ICD-10-CM

## 2022-10-21 DIAGNOSIS — L89151 Pressure ulcer of sacral region, stage 1: Secondary | ICD-10-CM | POA: Diagnosis present

## 2022-10-21 DIAGNOSIS — R1311 Dysphagia, oral phase: Secondary | ICD-10-CM | POA: Diagnosis present

## 2022-10-21 DIAGNOSIS — E162 Hypoglycemia, unspecified: Secondary | ICD-10-CM | POA: Diagnosis not present

## 2022-10-21 DIAGNOSIS — R6521 Severe sepsis with septic shock: Secondary | ICD-10-CM | POA: Diagnosis present

## 2022-10-21 DIAGNOSIS — E43 Unspecified severe protein-calorie malnutrition: Secondary | ICD-10-CM | POA: Diagnosis not present

## 2022-10-21 DIAGNOSIS — I2510 Atherosclerotic heart disease of native coronary artery without angina pectoris: Secondary | ICD-10-CM | POA: Diagnosis present

## 2022-10-21 DIAGNOSIS — H409 Unspecified glaucoma: Secondary | ICD-10-CM | POA: Diagnosis present

## 2022-10-21 DIAGNOSIS — Z880 Allergy status to penicillin: Secondary | ICD-10-CM

## 2022-10-21 DIAGNOSIS — Z634 Disappearance and death of family member: Secondary | ICD-10-CM

## 2022-10-21 DIAGNOSIS — Z931 Gastrostomy status: Secondary | ICD-10-CM

## 2022-10-21 DIAGNOSIS — R739 Hyperglycemia, unspecified: Secondary | ICD-10-CM | POA: Diagnosis present

## 2022-10-21 DIAGNOSIS — Z79890 Hormone replacement therapy (postmenopausal): Secondary | ICD-10-CM

## 2022-10-21 DIAGNOSIS — I480 Paroxysmal atrial fibrillation: Secondary | ICD-10-CM | POA: Diagnosis present

## 2022-10-21 LAB — COMPREHENSIVE METABOLIC PANEL
ALT: 7 U/L (ref 0–44)
AST: 19 U/L (ref 15–41)
Albumin: 2.5 g/dL — ABNORMAL LOW (ref 3.5–5.0)
Alkaline Phosphatase: 56 U/L (ref 38–126)
Anion gap: 17 — ABNORMAL HIGH (ref 5–15)
BUN: 26 mg/dL — ABNORMAL HIGH (ref 6–20)
CO2: 18 mmol/L — ABNORMAL LOW (ref 22–32)
Calcium: 8.9 mg/dL (ref 8.9–10.3)
Chloride: 107 mmol/L (ref 98–111)
Creatinine, Ser: 1.41 mg/dL — ABNORMAL HIGH (ref 0.61–1.24)
GFR, Estimated: 59 mL/min — ABNORMAL LOW (ref >60)
Glucose, Bld: 129 mg/dL — ABNORMAL HIGH (ref 70–99)
Potassium: 4.2 mmol/L (ref 3.5–5.1)
Sodium: 142 mmol/L (ref 135–145)
Total Bilirubin: 0.4 mg/dL (ref 0.3–1.2)
Total Protein: 7.7 g/dL (ref 6.5–8.1)

## 2022-10-21 LAB — I-STAT ARTERIAL BLOOD GAS, ED
Acid-base deficit: 4 mmol/L — ABNORMAL HIGH (ref 0.0–2.0)
Acid-base deficit: 1 mmol/L (ref 0.0–2.0)
Bicarbonate: 22.6 mmol/L (ref 20.0–28.0)
Bicarbonate: 24.2 mmol/L (ref 20.0–28.0)
Calcium, Ion: 1.26 mmol/L (ref 1.15–1.40)
Calcium, Ion: 1.27 mmol/L (ref 1.15–1.40)
HCT: 38 % — ABNORMAL LOW (ref 39.0–52.0)
HCT: 32 % — ABNORMAL LOW (ref 39.0–52.0)
Hemoglobin: 12.9 g/dL — ABNORMAL LOW (ref 13.0–17.0)
Hemoglobin: 10.9 g/dL — ABNORMAL LOW (ref 13.0–17.0)
O2 Saturation: 97 %
O2 Saturation: 92 %
Patient temperature: 101.9
Patient temperature: 99.6
Potassium: 3.6 mmol/L (ref 3.5–5.1)
Potassium: 3.9 mmol/L (ref 3.5–5.1)
Sodium: 142 mmol/L (ref 135–145)
Sodium: 141 mmol/L (ref 135–145)
TCO2: 24 mmol/L (ref 22–32)
TCO2: 25 mmol/L (ref 22–32)
pCO2 arterial: 50.1 mmHg — ABNORMAL HIGH (ref 32–48)
pCO2 arterial: 42.9 mmHg (ref 32–48)
pH, Arterial: 7.271 — ABNORMAL LOW (ref 7.35–7.45)
pH, Arterial: 7.361 (ref 7.35–7.45)
pO2, Arterial: 108 mmHg (ref 83–108)
pO2, Arterial: 69 mmHg — ABNORMAL LOW (ref 83–108)

## 2022-10-21 LAB — CBC WITH DIFFERENTIAL/PLATELET
Abs Immature Granulocytes: 0.11 10*3/uL — ABNORMAL HIGH (ref 0.00–0.07)
Basophils Absolute: 0.1 10*3/uL (ref 0.0–0.1)
Basophils Relative: 0 %
Eosinophils Absolute: 0 10*3/uL (ref 0.0–0.5)
Eosinophils Relative: 0 %
HCT: 45.1 % (ref 39.0–52.0)
Hemoglobin: 14.3 g/dL (ref 13.0–17.0)
Immature Granulocytes: 1 %
Lymphocytes Relative: 6 %
Lymphs Abs: 0.8 10*3/uL (ref 0.7–4.0)
MCH: 29.2 pg (ref 26.0–34.0)
MCHC: 31.7 g/dL (ref 30.0–36.0)
MCV: 92 fL (ref 80.0–100.0)
Monocytes Absolute: 0.8 10*3/uL (ref 0.1–1.0)
Monocytes Relative: 6 %
Neutro Abs: 11.9 10*3/uL — ABNORMAL HIGH (ref 1.7–7.7)
Neutrophils Relative %: 87 %
Platelets: 301 10*3/uL (ref 150–400)
RBC: 4.9 MIL/uL (ref 4.22–5.81)
RDW: 13.3 % (ref 11.5–15.5)
WBC: 13.6 10*3/uL — ABNORMAL HIGH (ref 4.0–10.5)
nRBC: 0 % (ref 0.0–0.2)

## 2022-10-21 LAB — RESP PANEL BY RT-PCR (FLU A&B, COVID) ARPGX2
Influenza A by PCR: NEGATIVE
Influenza B by PCR: NEGATIVE
SARS Coronavirus 2 by RT PCR: NEGATIVE

## 2022-10-21 LAB — PHOSPHORUS: Phosphorus: 3.5 mg/dL (ref 2.5–4.6)

## 2022-10-21 LAB — CBC
HCT: 37.2 % — ABNORMAL LOW (ref 39.0–52.0)
Hemoglobin: 11.5 g/dL — ABNORMAL LOW (ref 13.0–17.0)
MCH: 29 pg (ref 26.0–34.0)
MCHC: 30.9 g/dL (ref 30.0–36.0)
MCV: 93.7 fL (ref 80.0–100.0)
Platelets: 161 10*3/uL (ref 150–400)
RBC: 3.97 MIL/uL — ABNORMAL LOW (ref 4.22–5.81)
RDW: 13.3 % (ref 11.5–15.5)
WBC: 10.9 10*3/uL — ABNORMAL HIGH (ref 4.0–10.5)
nRBC: 0 % (ref 0.0–0.2)

## 2022-10-21 LAB — PROTIME-INR
INR: 1.1 (ref 0.8–1.2)
Prothrombin Time: 13.6 seconds (ref 11.4–15.2)

## 2022-10-21 LAB — TROPONIN I (HIGH SENSITIVITY)
Troponin I (High Sensitivity): 11 ng/L (ref <18)
Troponin I (High Sensitivity): 16 ng/L (ref <18)

## 2022-10-21 LAB — CREATININE, SERUM
Creatinine, Ser: 1.28 mg/dL — ABNORMAL HIGH (ref 0.61–1.24)
GFR, Estimated: >60 mL/min (ref >60)

## 2022-10-21 LAB — MAGNESIUM: Magnesium: 1.7 mg/dL (ref 1.7–2.4)

## 2022-10-21 LAB — CBG MONITORING, ED: Glucose-Capillary: 97 mg/dL (ref 70–99)

## 2022-10-21 LAB — LACTIC ACID, PLASMA
Lactic Acid, Venous: 4.8 mmol/L (ref 0.5–1.9)
Lactic Acid, Venous: 3 mmol/L (ref 0.5–1.9)

## 2022-10-21 LAB — APTT: aPTT: 43 seconds — ABNORMAL HIGH (ref 24–36)

## 2022-10-21 LAB — HIV ANTIBODY (ROUTINE TESTING W REFLEX): HIV Screen 4th Generation wRfx: NONREACTIVE

## 2022-10-21 MED ORDER — VANCOMYCIN HCL IN DEXTROSE 1-5 GM/200ML-% IV SOLN: 1000.0000 mg | Freq: Once | INTRAVENOUS | Status: DC

## 2022-10-21 MED ORDER — LEVOFLOXACIN IN D5W 750 MG/150ML IV SOLN: 750.0000 mg | Freq: Once | INTRAVENOUS | Status: DC

## 2022-10-21 MED ORDER — FENTANYL CITRATE PF 50 MCG/ML IJ SOSY
50.0000 ug | PREFILLED_SYRINGE | Freq: Once | INTRAMUSCULAR | Status: AC
  Administered 2022-10-21: 50 ug via INTRAVENOUS

## 2022-10-21 MED ORDER — POLYETHYLENE GLYCOL 3350 17 G PO PACK: 17.0000 g | PACK | Freq: Every day | ORAL | Status: DC | PRN

## 2022-10-21 MED ORDER — IPRATROPIUM-ALBUTEROL 0.5-2.5 (3) MG/3ML IN SOLN: 3.0000 mL | Freq: Four times a day (QID) | RESPIRATORY_TRACT | Status: DC | PRN

## 2022-10-21 MED ORDER — LACTATED RINGERS IV SOLN: INTRAVENOUS | Status: AC

## 2022-10-21 MED ORDER — SODIUM CHLORIDE 0.9 % IV SOLN
2.0000 g | Freq: Once | INTRAVENOUS | Status: AC
  Administered 2022-10-21: 2 g via INTRAVENOUS
  Filled 2022-10-21: qty 12.5

## 2022-10-21 MED ORDER — VITAL HIGH PROTEIN PO LIQD
1000.0000 mL | ORAL | Status: DC
  Filled 2022-10-21: qty 1000

## 2022-10-21 MED ORDER — PROSOURCE TF20 ENFIT COMPATIBL EN LIQD
60.0000 mL | Freq: Every day | ENTERAL | Status: DC
  Administered 2022-10-22 – 2022-10-23 (×2): 60 mL
  Filled 2022-10-21 (×2): qty 60

## 2022-10-21 MED ORDER — INSULIN ASPART 100 UNIT/ML IJ SOLN
1.0000 [IU] | INTRAMUSCULAR | Status: DC
  Administered 2022-10-23 – 2022-10-26 (×3): 1 [IU] via SUBCUTANEOUS

## 2022-10-21 MED ORDER — SODIUM CHLORIDE 0.9 % IV SOLN
2.0000 g | Freq: Two times a day (BID) | INTRAVENOUS | Status: DC
  Administered 2022-10-22: 2 g via INTRAVENOUS
  Filled 2022-10-21: qty 12.5

## 2022-10-21 MED ORDER — SODIUM CHLORIDE 0.9 % IV SOLN
INTRAVENOUS | Status: AC | PRN
  Administered 2022-10-21: 1000 mL via INTRAVENOUS

## 2022-10-21 MED ORDER — PROPOFOL 1000 MG/100ML IV EMUL
INTRAVENOUS | Status: AC
  Administered 2022-10-21: 10 ug/kg/min via INTRAVENOUS
  Filled 2022-10-21: qty 100

## 2022-10-21 MED ORDER — HEPARIN SODIUM (PORCINE) 5000 UNIT/ML IJ SOLN
5000.0000 [IU] | Freq: Three times a day (TID) | INTRAMUSCULAR | Status: DC
  Administered 2022-10-22 (×2): 5000 [IU] via SUBCUTANEOUS
  Filled 2022-10-21 (×2): qty 1

## 2022-10-21 MED ORDER — LACTATED RINGERS IV BOLUS (SEPSIS)
1000.0000 mL | Freq: Once | INTRAVENOUS | Status: AC
  Administered 2022-10-21: 1000 mL via INTRAVENOUS

## 2022-10-21 MED ORDER — FENTANYL BOLUS VIA INFUSION: 50.0000 ug | INTRAVENOUS | Status: DC | PRN

## 2022-10-21 MED ORDER — PROPOFOL 1000 MG/100ML IV EMUL: 5.0000 ug/kg/min | INTRAVENOUS | Status: DC

## 2022-10-21 MED ORDER — ETOMIDATE 2 MG/ML IV SOLN
INTRAVENOUS | Status: AC | PRN
  Administered 2022-10-21: 20 mg via INTRAVENOUS

## 2022-10-21 MED ORDER — FENTANYL 2500MCG IN NS 250ML (10MCG/ML) PREMIX INFUSION
50.0000 ug/h | INTRAVENOUS | Status: DC
  Administered 2022-10-21: 50 ug/h via INTRAVENOUS
  Administered 2022-10-22 – 2022-10-23 (×2): 150 ug/h via INTRAVENOUS
  Filled 2022-10-21 (×3): qty 250

## 2022-10-21 MED ORDER — SODIUM CHLORIDE 0.9 % IV SOLN
250.0000 mL | INTRAVENOUS | Status: DC
  Administered 2022-10-22 – 2022-10-23 (×2): 250 mL via INTRAVENOUS

## 2022-10-21 MED ORDER — PROPOFOL 1000 MG/100ML IV EMUL
5.0000 ug/kg/min | INTRAVENOUS | Status: DC
  Administered 2022-10-21: 10 ug/kg/min via INTRAVENOUS
  Administered 2022-10-21: 5 ug/kg/min via INTRAVENOUS
  Administered 2022-10-22: 30 ug/kg/min via INTRAVENOUS
  Administered 2022-10-22: 20 ug/kg/min via INTRAVENOUS
  Administered 2022-10-23: 10 ug/kg/min via INTRAVENOUS
  Administered 2022-10-23: 30 ug/kg/min via INTRAVENOUS
  Administered 2022-10-24: 20 ug/kg/min via INTRAVENOUS
  Filled 2022-10-21 (×6): qty 100

## 2022-10-21 MED ORDER — IPRATROPIUM-ALBUTEROL 0.5-2.5 (3) MG/3ML IN SOLN
3.0000 mL | Freq: Four times a day (QID) | RESPIRATORY_TRACT | Status: DC
  Administered 2022-10-21 – 2022-10-23 (×6): 3 mL via RESPIRATORY_TRACT
  Filled 2022-10-21 (×6): qty 3

## 2022-10-21 MED ORDER — VANCOMYCIN HCL 750 MG/150ML IV SOLN
750.0000 mg | Freq: Two times a day (BID) | INTRAVENOUS | Status: DC
  Administered 2022-10-22: 750 mg via INTRAVENOUS
  Filled 2022-10-21 (×2): qty 150

## 2022-10-21 MED ORDER — ACETAMINOPHEN 650 MG RE SUPP
650.0000 mg | Freq: Once | RECTAL | Status: AC
  Administered 2022-10-21: 650 mg via RECTAL
  Filled 2022-10-21: qty 1

## 2022-10-21 MED ORDER — ROCURONIUM BROMIDE 50 MG/5ML IV SOLN
INTRAVENOUS | Status: AC | PRN
  Administered 2022-10-21: 100 mg via INTRAVENOUS

## 2022-10-21 MED ORDER — DOCUSATE SODIUM 50 MG/5ML PO LIQD: 100.0000 mg | Freq: Two times a day (BID) | ORAL | Status: DC | PRN

## 2022-10-21 MED ORDER — VANCOMYCIN HCL 1750 MG/350ML IV SOLN
1750.0000 mg | Freq: Once | INTRAVENOUS | Status: AC
  Administered 2022-10-21: 1750 mg via INTRAVENOUS
  Filled 2022-10-21: qty 350

## 2022-10-21 MED ORDER — SODIUM CHLORIDE 0.9 % IV SOLN
500.0000 mg | INTRAVENOUS | Status: DC
  Administered 2022-10-21 – 2022-10-22 (×2): 500 mg via INTRAVENOUS
  Filled 2022-10-21 (×2): qty 5

## 2022-10-21 MED ORDER — ONDANSETRON HCL 4 MG/2ML IJ SOLN: 4.0000 mg | Freq: Four times a day (QID) | INTRAMUSCULAR | Status: DC | PRN

## 2022-10-21 MED ORDER — LACTATED RINGERS IV BOLUS (SEPSIS)
500.0000 mL | Freq: Once | INTRAVENOUS | Status: AC
  Administered 2022-10-21: 500 mL via INTRAVENOUS

## 2022-10-21 MED ORDER — NOREPINEPHRINE 4 MG/250ML-% IV SOLN
2.0000 ug/min | INTRAVENOUS | Status: DC
  Administered 2022-10-21: 2 ug/min via INTRAVENOUS
  Administered 2022-10-22: 7 ug/min via INTRAVENOUS
  Administered 2022-10-22 – 2022-10-23 (×2): 6 ug/min via INTRAVENOUS
  Administered 2022-10-23 (×2): 8 ug/min via INTRAVENOUS
  Administered 2022-10-24: 5 ug/min via INTRAVENOUS
  Administered 2022-10-25: 2 ug/min via INTRAVENOUS
  Administered 2022-10-26 – 2022-10-27 (×2): 4 ug/min via INTRAVENOUS
  Filled 2022-10-21 (×10): qty 250

## 2022-10-21 NOTE — Progress Notes (Signed)
Pharmacy Antibiotic Note  [name] is a 56 y.o. male admitted on 10/21/2022 with sepsis.  Pharmacy has been consulted for vancomycin and cefepime dosing.  Plan: Vancomycin 1750mg  once then 750mg  Q12h (eAUC 501.4, Scr 1.41) Cefepime 2g Q12h Azithromycin 500mg  daily Monitor renal function, follow culture for pathogen directed therapy  Height: 5\' 8"  (172.7 cm) Weight: 80 kg (176 lb 5.9 oz) IBW/kg (Calculated) : 68.4  Temp (24hrs), Avg:101.1 F (38.4 C), Min:100.4 F (38 C), Max:101.9 F (38.8 C)  Recent Labs  Lab 10/21/22 1827 10/21/22 1833  WBC  --  13.6*  CREATININE  --  1.41*  LATICACIDVEN 4.8*  --     Estimated Creatinine Clearance: 57.3 mL/min (A) (by C-G formula based on SCr of 1.41 mg/dL (H)).    Allergies  Allergen Reactions   Ativan [Lorazepam] Other (See Comments)    Unknown (not listed on MAR)   Latex Dermatitis    Reaction not listed on MAR   Morphine And Related Other (See Comments)    Reaction not listed on MAR   Other Other (See Comments)    Soy Sauce  Reaction not listed on MAR   Penicillins Other (See Comments)    Patient tolerated cephalosporins in past.  Reaction not listed on MAR.  Has patient had a PCN reaction causing immediate rash, facial/tongue/throat swelling, SOB or lightheadedness with hypotension: unknown Has patient had a PCN reaction causing severe rash involving mucus membranes or skin necrosis: unknown Has patient had a PCN reaction that required hospitalization: no Has patient had a PCN reaction occurring within the last 10 years: yes If all of the above answe   Pyridium [Phenazopyridine Hcl] Other (See Comments)    Reaction not listed on MAR   Soy Allergy Other (See Comments)    Reaction not listed on MAR   Soybean (Diagnostic) Other (See Comments)    Reaction not listed no MAR    Antimicrobials this admission: Azithromycin 10/25 >>  Cefepime 10/25 >>  Vancomycin 10/25 >>  Dose adjustments this  admission: N/a  Microbiology results: 10/25 Bcx x2: Collected 10/25 Resp panel: Collected  Thank you for allowing pharmacy to be a part of this patient's care.  Titus Dubin, PharmD PGY1 Pharmacy Resident 10/21/2022 8:33 PM

## 2022-10-21 NOTE — Assessment & Plan Note (Signed)
Currently mechanically ventilated, on high FiO2 but limited improvement with increased PEEP.   - Continue lung protective ventilation per ARDS protocol.

## 2022-10-21 NOTE — Assessment & Plan Note (Signed)
Will negatively affect prognosis.  - Start tube feeds

## 2022-10-21 NOTE — Assessment & Plan Note (Signed)
By history wormal EF in 2018 No signs of decompensation at this time.  - Follow up echocardiogram.

## 2022-10-21 NOTE — ED Notes (Signed)
Agarwala MD aware of pt's BP.

## 2022-10-21 NOTE — Progress Notes (Signed)
Vent settings changed per MD order  

## 2022-10-21 NOTE — H&P (Signed)
NAME:  [name], MRN:  [id number], DOB:  10-31-1966, LOS: 0 ADMISSION DATE:  10/21/2022, CONSULTATION DATE:  10/21/2022 REFERRING MD:  Theresia Lo ED [facility], CHIEF COMPLAINT:  hypoxia.   History of Present Illness:  56 year old man admitted from [facility] for hypoxia and unresponsiveness. Limited information available. May have aspirated. Hypoxic to 70's and agitated in ED so intubated.   Chronic SNF resident due to demential and anoxic brain injury, gait difficulties. May be bed bound.   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  He eventually recovered but had subsequent anoxic brain injury.  He has been institution bound since approximately 2007.  His sister states that he has had a significant functional decline in the past 2 years since their mother passed away.  Pertinent  Medical History   Past Medical History:  Diagnosis Date   Addison disease (HCC)    Anoxic brain injury (HCC)    TBI   Anxiety    Aspiration pneumonia (HCC)    Atrial fibrillation (HCC)    Chronic constipation 11/23/2011   Dementia (HCC)    Dysphagia    Encephalopathy    Frontal lobe syndrome    Gait abnormality    freq falls   Glaucoma    Glucocorticoid deficiency (HCC)    History of heart attack    History of recurrent UTIs    Hyperlipemia    Hypothyroidism    Mental disorder    Myocardial infarction (HCC)    Pneumoperitoneum of unknown etiology 12/21/2011   Quadriplegia (HCC)    Reflux    Seizure disorder (HCC)    Weakness of both legs     Significant Hospital Events: Including procedures, antibiotic start and stop dates in addition to other pertinent events   10/25 - admitted via ED.  Interim History /  Subjective:  Sedation stopped due to marginal BP   Objective   Blood pressure 96/74, pulse (!) 113, temperature 100.1 F (37.8 C), resp. rate 15, height 5\' 8"  (1.727 m), weight 80 kg, SpO2 94 %.    Vent Mode: PRVC FiO2 (%):  [100 %] 100 % Set Rate:  [18 bmp-20 bmp] 20 bmp Vt Set:  [540 mL] 540 mL PEEP:  [5 cmH20] 5 cmH20 Plateau Pressure:  [16 cmH20-18 cmH20] 16 cmH20   Intake/Output Summary (Last 24 hours) at 10/21/2022 2037 Last data filed at 10/21/2022 2016 Gross per 24 hour  Intake 1523.55 ml  Output --  Net 1523.55 ml   Filed Weights   10/21/22 1917  Weight: 80 kg    Examination: General: cachectic HENT: orally intubated Lungs: coarse crackles at right base Cardiovascular: HS normal. Extremities warm Abdomen: PEG tube in place. Soft  Extremities: dry, no edema Neuro: sedated with no response to verbal stimulus GU: foley catheter in place  Ancillary tests personally reviewed:  CXR: shows volume loss at right base.  ABG mild respiratory acidosis.   Assessment & Plan:  Acute respiratory failure with hypoxia (HCC) Currently mechanically ventilated, on high FiO2 but limited improvement with increased PEEP.  - Continue lung protective ventilation per ARDS protocol.   Severe sepsis due to pneumonia - Fluid resuscitation per protocol - Follow serial lactate -  Early antibiotics administered - Cultures sent   AKI - fluid resuscitation - follow renal function.  HCAP (healthcare-associated pneumonia) Likely involving right lower lube. - continue empiric antibiotics pending cultures results  Protein calorie malnutrition ([place]) Will negatively affect prognosis.  - Start tube feeds  Cardiomyopathy, ischemic By history wormal EF in 2018 No signs of decompensation at this time.  - Follow up echocardiogram.    Best Practice (right click and "Reselect all SmartList Selections" daily)   Diet/type: tubefeeds DVT prophylaxis: LMWH GI prophylaxis: PPI Lines:  N/A Foley:  Yes, and it is still needed Code Status:  limited Last date of multidisciplinary goals of care discussion: I spoke with the patient's sister who has a very clear understanding of his medical condition.  I explained that his pneumonia is part and parcel of his generalized decline and should be seen as part of a progressive disease process.  Therefore recovery to his previous limited functional status and marginal quality of life is far from guaranteed.  We have agreed to a 48-hour trial of aggressive medical care to see if he improves.  If he continues to decline or if recovery appears to be protracted then our window for recovery to a meaningful quality of life will likely have unfortunately closed.  She has stated that she is not wanting to suffer unnecessarily.  She has agreed to a DNR but continuation of current aggressive level of care.  If he continues to decline further overnight we will contact her again and further to discuss goals of care.  CRITICAL CARE Performed by: Kipp Brood   Total critical care time: 40 minutes  Critical care time was exclusive of separately billable procedures and treating other patients.  Critical care was necessary to treat or prevent imminent or life-threatening deterioration.  Critical care was time spent personally by me on the following activities: development of treatment plan with patient and/or surrogate as well as nursing, discussions with consultants, evaluation of patient's response to treatment, examination of patient, obtaining history from patient or surrogate, ordering and performing treatments and interventions, ordering and review of laboratory studies, ordering and review of radiographic studies, pulse oximetry, re-evaluation of patient's condition and participation in multidisciplinary rounds.  Kipp Brood, MD [facility] ICU Physician [place]  Pager: [phone] Mobile: [phone] After hours:  [phone].

## 2022-10-21 NOTE — Assessment & Plan Note (Addendum)
Likely involving right lower lube. - continue empiric antibiotics pending cultures results

## 2022-10-21 NOTE — Sepsis Progress Note (Signed)
Elink monitoring for the code sepsis protocol.  

## 2022-10-21 NOTE — ED Triage Notes (Signed)
Pt her from [facility] via [facility] for lethargy and trouble breathing. Per EMS upon arrival pt was 75% on 6L O2 on nasal cannula, RR 40+, possible aspiration earlier today per staff. Ems placed pt on NRB, SpO2 increased to 80s, ems began bagging pt and highest SpO2 was 88%. Pt is aox2 at baseline. Cbg 143, 144/80, 160HR

## 2022-10-21 NOTE — Assessment & Plan Note (Signed)
Limited mobility will impact subsequent recovery and quality of life

## 2022-10-21 NOTE — ED Notes (Signed)
Pt turned onto left side

## 2022-10-21 NOTE — ED Provider Notes (Signed)
[facility] EMERGENCY DEPARTMENT Provider Note   CSN: [id number] Arrival date & time: 10/21/22  1805     History  Chief Complaint  Patient presents with   Respiratory Arrest    Ermin Parisien is a 56 y.o. male.  Patient is a 56 year old male with a past medical history of anoxic brain injury ANO x2 at baseline, Addison's disease, seizure disorder presenting to the emergency department in respiratory distress.  Per EMS, the patient being shortness of breath today and had a chest x-ray performed at his nursing home that showed a right lower lobe pneumonia.  They state that they are concerned that he aspirated as he does have a PEG tube in place.  Patient had increased work of breathing so they called 911.  Medics report on their arrival he was hypoxic to the 70s on room air and tachypneic to the 40s.  They initially placed him on nasal cannula and nonrebreather that he was ripping these off his face and he was unable to tolerate CPAP so they placed BVM his sats improved to the high 80s.  Further history was limited due to patient's altered mental status.  The history is provided by the EMS personnel.       Home Medications Prior to Admission medications   Medication Sig Start Date End Date Taking? Authorizing Provider  acetaminophen (TYLENOL) 325 MG tablet Take 650 mg by mouth every 4 (four) hours as needed (pain).   Yes [provider]  albuterol (ACCUNEB) 0.63 MG/3ML nebulizer solution Take 1 ampule by nebulization every 6 (six) hours as needed for wheezing or shortness of breath (COPD).   Yes [provider]  aspirin 81 MG chewable tablet Chew 81 mg by mouth every morning. 0900   Yes [provider]  calcium carbonate (TUMS - DOSED IN MG ELEMENTAL CALCIUM) 500 MG chewable tablet Chew 1 tablet by mouth every 12 (twelve) hours as needed for indigestion or heartburn.   Yes [provider]  Carboxymeth-Glycerin-Polysorb (REFRESH  OPTIVE ADVANCED) 0.5-1-0.5 % SOLN Place 1 drop into both eyes 3 (three) times daily. 0900, 1400, 2100   Yes [provider]  clonazePAM (KLONOPIN) 0.5 MG tablet Take 0.5 mg by mouth 2 (two) times daily. 0830 and 1630   Yes [provider]  divalproex (DEPAKOTE SPRINKLE) 125 MG capsule Take 500 mg by mouth 3 (three) times daily. 0830, 1230, and 2030 07/18/13  Yes [provider]  docusate sodium (COLACE) 100 MG capsule Take 100 mg by mouth every morning. 0800   Yes [provider]  haloperidol (HALDOL) 5 MG tablet Take 5 mg by mouth 2 (two) times daily. 0830 and 2030   Yes [provider]  levothyroxine (SYNTHROID) 200 MCG tablet Take 200 mcg by mouth daily before breakfast. Take with a 25 mcg tablet for a total dose of 225 mcg every morning 0630   Yes [provider]  levothyroxine (SYNTHROID) 25 MCG tablet Take 25 mcg by mouth daily before breakfast. Give with current 200 mcg dose for 225 mcg 0630   Yes [provider]  Magnesium Hydroxide (MILK OF MAGNESIA PO) Take 30 mLs by mouth daily as needed (constipation).   Yes [provider]  omeprazole (PRILOSEC) 20 MG capsule Take 20 mg by mouth at bedtime.   Yes [provider]  polyethylene glycol (MIRALAX / GLYCOLAX) packet Take 17 g by mouth 2 (two) times daily. 0900 and 2100   Yes [provider]  senna (  SENOKOT) 8.6 MG tablet Take 2 tablets by mouth 2 (two) times daily. 0900 and 2000   Yes [provider]  sertraline (ZOLOFT) 50 MG tablet Take 75 mg by mouth every morning. 0900   Yes [provider]  cephALEXin (KEFLEX) 500 MG capsule Take 1 capsule (500 mg total) by mouth 4 (four) times daily. Patient not taking: Reported on 05/24/2022 05/15/22   [name], MD  fosfomycin (MONUROL) 3 g PACK Take 3 g by mouth See admin instructions. Give 3 g packet by mouth one time a day for UTI for one day  0900    [provider]   pantoprazole (PROTONIX) 40 MG tablet Take 40 mg by mouth daily. 0700    [provider]  pantoprazole sodium (PROTONIX) 40 mg/20 mL PACK Take 40 mg by mouth every morning. Patient not taking: Reported on 05/24/2022 11/30/11   [name], MD  traMADol (ULTRAM) 50 MG tablet Take 50 mg by mouth every 6 (six) hours as needed (pain).    [provider]      Allergies    Ativan [lorazepam], Latex, Morphine and related, Other, Penicillins, Pyridium [phenazopyridine hcl], Soy allergy, and Soybean (diagnostic)    Review of Systems   Review of Systems  Physical Exam Updated Vital Signs BP 90/70   Pulse 92   Temp 99.1 F (37.3 C)   Resp 20   Ht  (1.727 m)   Wt 80 kg   SpO2 98%   BMI 26.82 kg/m  Physical Exam Vitals and nursing note reviewed.  Constitutional:      General: He is in acute distress.     Appearance: He is ill-appearing and diaphoretic.  HENT:     Head: Normocephalic and atraumatic.     Nose: Nose normal.     Mouth/Throat:     Mouth: Mucous membranes are moist.     Pharynx: Oropharynx is clear.  Eyes:     Extraocular Movements: Extraocular movements intact.     Conjunctiva/sclera: Conjunctivae normal.     Pupils: Pupils are equal, round, and reactive to light.  Cardiovascular:     Rate and Rhythm: Regular rhythm. Tachycardia present.     Pulses: Normal pulses.     Heart sounds: Normal heart sounds.  Pulmonary:     Comments: Coarse breath sounds in the right lower lobe, tachypneic speaking in one-word sentences with retractions Abdominal:     General: Abdomen is flat.     Palpations: Abdomen is soft.     Tenderness: There is no abdominal tenderness.     Comments: PEG tube in place  Musculoskeletal:        General: No swelling or deformity.     Cervical back: Normal range of motion and neck supple.  Skin:    General: Skin is warm.     Findings: No rash.  Neurological:     General: No focal deficit present.     Mental Status: He is  alert.     Comments: Oriented to person and place     ED Results / Procedures / Treatments   Labs (all labs ordered are listed, but only abnormal results are displayed) Labs Reviewed  LACTIC ACID, PLASMA - Abnormal; Notable for the following components:      Result Value   Lactic Acid, Venous 4.8 (*)    All other components within normal limits  COMPREHENSIVE METABOLIC PANEL - Abnormal; Notable for the following components:   CO2 18 (*)  Glucose, Bld 129 (*)    BUN 26 (*)    Creatinine, Ser 1.41 (*)    Albumin 2.5 (*)    GFR, Estimated 59 (*)    Anion gap 17 (*)    All other components within normal limits  CBC WITH DIFFERENTIAL/PLATELET - Abnormal; Notable for the following components:   WBC 13.6 (*)    Neutro Abs 11.9 (*)    Abs Immature Granulocytes 0.11 (*)    All other components within normal limits  APTT - Abnormal; Notable for the following components:   aPTT 43 (*)    All other components within normal limits  CBC - Abnormal; Notable for the following components:   WBC 10.9 (*)    RBC 3.97 (*)    Hemoglobin 11.5 (*)    HCT 37.2 (*)    All other components within normal limits  I-STAT ARTERIAL BLOOD GAS, ED - Abnormal; Notable for the following components:   pH, Arterial 7.271 (*)    pCO2 arterial 50.1 (*)    Acid-base deficit 4.0 (*)    HCT 38.0 (*)    Hemoglobin 12.9 (*)    All other components within normal limits  I-STAT ARTERIAL BLOOD GAS, ED - Abnormal; Notable for the following components:   pO2, Arterial 69 (*)    HCT 32.0 (*)    Hemoglobin 10.9 (*)    All other components within normal limits  RESP PANEL BY RT-PCR (FLU A&B, COVID) ARPGX2  CULTURE, BLOOD (ROUTINE X 2)  CULTURE, BLOOD (ROUTINE X 2)  URINE CULTURE  CULTURE, RESPIRATORY W GRAM STAIN  CULTURE, RESPIRATORY W GRAM STAIN  PROTIME-INR  LACTIC ACID, PLASMA  URINALYSIS, ROUTINE W REFLEX MICROSCOPIC  BRAIN NATRIURETIC PEPTIDE  TRIGLYCERIDES  HIV ANTIBODY (ROUTINE TESTING W REFLEX)   CREATININE, SERUM  STREP PNEUMONIAE URINARY ANTIGEN  CBC  BASIC METABOLIC PANEL  BLOOD GAS, ARTERIAL  MAGNESIUM  MAGNESIUM  PHOSPHORUS  PHOSPHORUS  I-STAT ARTERIAL BLOOD GAS, ED  CBG MONITORING, ED  TROPONIN I (HIGH SENSITIVITY)  TROPONIN I (HIGH SENSITIVITY)    EKG EKG Interpretation  Date/Time:  Wednesday October 21 2022 18:09:50 EDT Ventricular Rate:  159 PR Interval:  110 QRS Duration: 80 QT Interval:  347 QTC Calculation: 565 R Axis:   216 Text Interpretation: Sinus tachycardia Right axis deviation ST depression, probably rate related Minimal ST elevation, lateral leads Prolonged QT interval Baseline wander Confirmed by Elayne Snare ([id number]) on 10/21/2022 6:39:37 PM  Radiology DG Chest Portable 1 View  Result Date: 10/21/2022 CLINICAL DATA:  intubation EXAM: PORTABLE CHEST 1 VIEW COMPARISON:  Chest x-ray 05/08/2022 FINDINGS: Endotracheal tube 7 cm above the carina. Enteric tube coursing below the hemidiaphragm with side port overlying the expected region of the gastric lumen and tip collimated off view. The heart and mediastinal contours are within normal limits. Diffuse patchy airspace and interstitial opacities. No pulmonary edema. No pleural effusion. No pneumothorax. No acute osseous abnormality. IMPRESSION: 1. Endotracheal tube 7 cm above the carina. Recommend advancing by 2 cm. 2. Enteric tube in grossly appropriate position. 3. Diffuse patchy airspace and interstitial opacities. Followup PA and lateral chest X-ray is recommended in 3-4 weeks following therapy to ensure resolution and exclude underlying malignancy. Electronically Signed   By: Tish Frederickson M.D.   On: 10/21/2022 19:08    Procedures Procedure Name: Intubation Date/Time: 10/21/2022 10:07 PM  Performed by: Rexford Maus, DOPre-anesthesia Checklist: Patient identified, Patient being monitored, Suction available and Timeout performed Oxygen Delivery Method: Ambu bag Preoxygenation:  Pre-oxygenation with 100% oxygen Induction Type: Rapid sequence and IV induction Ventilation: Mask ventilation without difficulty Laryngoscope Size: Glidescope and 3 Grade View: Grade I Tube type: Non-subglottic suction tube Tube size: 7.5 mm Number of attempts: 1 Airway Equipment and Method: Rigid stylet Placement Confirmation: ETT inserted through vocal cords under direct vision, CO2 detector and Breath sounds checked- equal and bilateral Secured at: 22 cm Tube secured with: ETT holder Dental Injury: Teeth and Oropharynx as per pre-operative assessment     .Critical Care  Performed by: Rexford Maus, DO Authorized by: Rexford Maus, DO   Critical care provider statement:    Critical care time (minutes):  45   Critical care time was exclusive of:  Separately billable procedures and treating other patients   Critical care was necessary to treat or prevent imminent or life-threatening deterioration of the following conditions:  Respiratory failure and sepsis   Critical care was time spent personally by me on the following activities:  Discussions with consultants, blood draw for specimens, discussions with primary provider, evaluation of patient's response to treatment, examination of patient, obtaining history from patient or surrogate, ordering and performing treatments and interventions, ordering and review of laboratory studies, ordering and review of radiographic studies, pulse oximetry, re-evaluation of patient's condition, review of old charts and ventilator management   I assumed direction of critical care for this patient from another provider in my specialty: no     Care discussed with: admitting provider   Ultrasound ED Peripheral IV (Provider)  Date/Time: 10/21/2022 10:09 PM  Performed by: Rexford Maus, DO Authorized by: Rexford Maus, DO   Procedure details:    Indications: multiple failed IV attempts and poor IV access     Skin Prep:  chlorhexidine gluconate     Location:  Left AC   Angiocath:  20 G   Bedside Ultrasound Guided: Yes     Images: not archived     Patient tolerated procedure without complications: Yes     Dressing applied: Yes       Medications Ordered in ED Medications  fentaNYL in NS (47mcg/ml) infusion-PREMIX (50 mcg/hr Intravenous Infusion Verify 10/21/22 2016)  fentaNYL (SUBLIMAZE) bolus via infusion 50-100 mcg (has no administration in time range)  lactated ringers infusion (has no administration in time range)  vancomycin (VANCOREADY) IVPB 1750 mg/350 mL (1,750 mg Intravenous New Bag/Given 10/21/22 2123)  azithromycin (ZITHROMAX) 500 mg in sodium chloride 0.9 % 250 mL IVPB (0 mg Intravenous Stopped 10/21/22 2047)  propofol (DIPRIVAN) 1000 MG/100ML infusion (10 mcg/kg/min  80 kg Intravenous Infusion Verify 10/21/22 1834)  vancomycin (VANCOREADY) IVPB 750 mg/150 mL (has no administration in time range)  ceFEPIme (MAXIPIME) 2 g in sodium chloride 0.9 % 100 mL IVPB (has no administration in time range)  0.9 %  sodium chloride infusion (has no administration in time range)  norepinephrine (LEVOPHED) 4mg  in (0.016 mg/mL) premix infusion (2 mcg/min Intravenous New Bag/Given 10/21/22 2120)  docusate (COLACE) 50 MG/5ML liquid 100 mg (has no administration in time range)  polyethylene glycol (MIRALAX / GLYCOLAX) packet 17 g (has no administration in time range)  heparin injection 5,000 Units (has no administration in time range)  ondansetron (ZOFRAN) injection 4 mg (has no administration in time range)  ipratropium-albuterol (DUONEB) 0.5-2.5 (3) MG/3ML nebulizer solution 3 mL (3 mLs Nebulization Given 10/21/22 2135)  ipratropium-albuterol (DUONEB) 0.5-2.5 (3) MG/3ML nebulizer solution 3 mL (has no administration in time range)  insulin aspart (novoLOG) injection 1-3 Units (  Subcutaneous Not Given 10/21/22 2218)  feeding supplement (VITAL HIGH PROTEIN) liquid 1,000 mL (has no  administration in time range)  feeding supplement (PROSource TF20) liquid 60 mL (has no administration in time range)  0.9 %  sodium chloride infusion (0 mLs Intravenous Stopped 10/21/22 1911)  etomidate (AMIDATE) injection (20 mg Intravenous Given 10/21/22 1822)  rocuronium (ZEMURON) injection (100 mg Intravenous Given 10/21/22 1823)  fentaNYL (SUBLIMAZE) injection 50 mcg (50 mcg Intravenous Bolus 10/21/22 1841)  lactated ringers bolus 1,000 mL (0 mLs Intravenous Stopped 10/21/22 2016)    And  lactated ringers bolus 1,000 mL (0 mLs Intravenous Stopped 10/21/22 2128)    And  lactated ringers bolus 1,000 mL (0 mLs Intravenous Stopped 10/21/22 2213)    And  lactated ringers bolus 500 mL (500 mLs Intravenous New Bag/Given 10/21/22 2213)  ceFEPIme (MAXIPIME) 2 g in sodium chloride 0.9 % 100 mL IVPB (0 g Intravenous Stopped 10/21/22 1924)  acetaminophen (TYLENOL) suppository 650 mg (650 mg Rectal Given 10/21/22 1950)    ED Course/ Medical Decision Making/ A&P Clinical Course as of 10/21/22 2232  Wed Oct 21, 2022  1940 CXR with ET tube through the clavicles and will be advanced 2 cm.  Blood gas with respiratory acidosis and rate will be increased to 20. [VK]  2113 Patient's blood pressures are soft in the 58K systolic with still maps of the 70s.  Attempt to down titrate some of his sedation and Levophed will be ordered for bedside if needed.  Patient is pending admission to ICU. [VK]    Clinical Course User Index [VK] Kemper Durie, DO                           Medical Decision Making This patient presents to the ED with chief complaint(s) of respiratory distress with pertinent past medical history of anoxic brain injury which further complicates the presenting complaint. The complaint involves an extensive differential diagnosis and also carries with it a high risk of complications and morbidity.    The differential diagnosis includes sepsis, aspiration, pneumonia, pulmonary edema,  pneumothorax, respiratory failure, ACS, arrhythmia, electrolyte abnormality, dehydration, anemia  Additional history obtained: Additional history obtained from EMS  and nursing home/care facility Records reviewed [place]  ED Course and Reassessment: Upon patient's arrival to the emergency department he was hypoxic on a nonrebreather satting in the low 90s, tachycardic, tachypneic and diaphoretic clearly in respiratory distress.  IV access was obtained.  His paperwork was reviewed and he is full code plan was made for intubation.  Please see my procedure note for intubation.  Labs and chest x-ray were performed to further evaluate for cause of his hypoxic respiratory failure.  He did have patient x-ray that showed a right lower lobe pneumonia and he will be started on broad-spectrum antibiotics and fluid resuscitated for presumed sepsis.  Independent labs interpretation:  The following labs were independently interpreted: Elevated white count and lactate, consistent with sepsis  Independent visualization of imaging: - I independently visualized the following imaging with scope of interpretation limited to determining acute life threatening conditions related to emergency care: Chest x-ray, which revealed right lower lobe infiltrate, ET tube at the level of the clavicles, OG tube in good place  Consultation: - Consulted or discussed management/test interpretation w/ external professional: Critical care  Consideration for admission or further workup: Requires admission for further management of his acute hypoxic respiratory failure likely in the setting of sepsis from  pneumonia Social Determinants of health: N/A    Amount and/or Complexity of Data Reviewed Labs: ordered. Radiology: ordered. ECG/medicine tests: ordered.  Risk OTC drugs. Prescription drug management. Decision regarding hospitalization.          Final Clinical Impression(s) / ED Diagnoses Final  diagnoses:  Acute respiratory failure with hypoxia (HCC)  Pneumonia of right lower lobe due to infectious organism    Rx / DC Orders ED Discharge Orders     None         Rexford MausKingsley, Yenty Bloch K, DO 10/21/22 2232

## 2022-10-22 ENCOUNTER — Inpatient Hospital Stay (HOSPITAL_COMMUNITY): Payer: 59

## 2022-10-22 DIAGNOSIS — E440 Moderate protein-calorie malnutrition: Secondary | ICD-10-CM | POA: Insufficient documentation

## 2022-10-22 DIAGNOSIS — L8990 Pressure ulcer of unspecified site, unspecified stage: Secondary | ICD-10-CM | POA: Insufficient documentation

## 2022-10-22 DIAGNOSIS — I255 Ischemic cardiomyopathy: Secondary | ICD-10-CM | POA: Diagnosis not present

## 2022-10-22 DIAGNOSIS — J9601 Acute respiratory failure with hypoxia: Secondary | ICD-10-CM | POA: Diagnosis not present

## 2022-10-22 LAB — ECHOCARDIOGRAM COMPLETE
AR max vel: 5.18 cm2
AV Area VTI: 4.23 cm2
AV Area mean vel: 4.94 cm2
AV Mean grad: 3 mmHg
AV Peak grad: 5.8 mmHg
Ao pk vel: 1.2 m/s
Area-P 1/2: 4.49 cm2
Height: 68 in
S' Lateral: 3.1 cm
Weight: 2000.01 oz

## 2022-10-22 LAB — GLUCOSE, CAPILLARY
Glucose-Capillary: 101 mg/dL — ABNORMAL HIGH (ref 70–99)
Glucose-Capillary: 116 mg/dL — ABNORMAL HIGH (ref 70–99)
Glucose-Capillary: 77 mg/dL (ref 70–99)
Glucose-Capillary: 83 mg/dL (ref 70–99)
Glucose-Capillary: 85 mg/dL (ref 70–99)
Glucose-Capillary: 86 mg/dL (ref 70–99)
Glucose-Capillary: 91 mg/dL (ref 70–99)

## 2022-10-22 LAB — POCT I-STAT 7, (LYTES, BLD GAS, ICA,H+H)
Acid-Base Excess: 0 mmol/L (ref 0.0–2.0)
Acid-base deficit: 4 mmol/L — ABNORMAL HIGH (ref 0.0–2.0)
Bicarbonate: 22.6 mmol/L (ref 20.0–28.0)
Bicarbonate: 26.5 mmol/L (ref 20.0–28.0)
Calcium, Ion: 1.27 mmol/L (ref 1.15–1.40)
Calcium, Ion: 1.27 mmol/L (ref 1.15–1.40)
HCT: 34 % — ABNORMAL LOW (ref 39.0–52.0)
HCT: 35 % — ABNORMAL LOW (ref 39.0–52.0)
Hemoglobin: 11.6 g/dL — ABNORMAL LOW (ref 13.0–17.0)
Hemoglobin: 11.9 g/dL — ABNORMAL LOW (ref 13.0–17.0)
O2 Saturation: 100 %
O2 Saturation: 91 %
Patient temperature: 37.5
Patient temperature: 37.8
Potassium: 4.3 mmol/L (ref 3.5–5.1)
Potassium: 4.4 mmol/L (ref 3.5–5.1)
Sodium: 142 mmol/L (ref 135–145)
Sodium: 142 mmol/L (ref 135–145)
TCO2: 24 mmol/L (ref 22–32)
TCO2: 28 mmol/L (ref 22–32)
pCO2 arterial: 47.4 mmHg (ref 32–48)
pCO2 arterial: 50.8 mmHg — ABNORMAL HIGH (ref 32–48)
pH, Arterial: 7.29 — ABNORMAL LOW (ref 7.35–7.45)
pH, Arterial: 7.328 — ABNORMAL LOW (ref 7.35–7.45)
pO2, Arterial: 210 mmHg — ABNORMAL HIGH (ref 83–108)
pO2, Arterial: 72 mmHg — ABNORMAL LOW (ref 83–108)

## 2022-10-22 LAB — BASIC METABOLIC PANEL
Anion gap: 12 (ref 5–15)
Anion gap: 14 (ref 5–15)
BUN: 24 mg/dL — ABNORMAL HIGH (ref 6–20)
BUN: 22 mg/dL — ABNORMAL HIGH (ref 6–20)
CO2: 22 mmol/L (ref 22–32)
CO2: 23 mmol/L (ref 22–32)
Calcium: 8.4 mg/dL — ABNORMAL LOW (ref 8.9–10.3)
Calcium: 8.6 mg/dL — ABNORMAL LOW (ref 8.9–10.3)
Chloride: 107 mmol/L (ref 98–111)
Chloride: 105 mmol/L (ref 98–111)
Creatinine, Ser: 1.05 mg/dL (ref 0.61–1.24)
Creatinine, Ser: 0.88 mg/dL (ref 0.61–1.24)
GFR, Estimated: >60 mL/min (ref >60)
GFR, Estimated: >60 mL/min (ref >60)
Glucose, Bld: 87 mg/dL (ref 70–99)
Glucose, Bld: 107 mg/dL — ABNORMAL HIGH (ref 70–99)
Potassium: 4.3 mmol/L (ref 3.5–5.1)
Potassium: 4.6 mmol/L (ref 3.5–5.1)
Sodium: 141 mmol/L (ref 135–145)
Sodium: 142 mmol/L (ref 135–145)

## 2022-10-22 LAB — URINALYSIS, ROUTINE W REFLEX MICROSCOPIC
Bilirubin Urine: NEGATIVE
Glucose, UA: NEGATIVE mg/dL
Hgb urine dipstick: NEGATIVE
Ketones, ur: NEGATIVE mg/dL
Nitrite: NEGATIVE
Protein, ur: 30 mg/dL — AB
Specific Gravity, Urine: 1.032 — ABNORMAL HIGH (ref 1.005–1.030)
pH: 5 (ref 5.0–8.0)

## 2022-10-22 LAB — MAGNESIUM
Magnesium: 1.7 mg/dL (ref 1.7–2.4)
Magnesium: 1.6 mg/dL — ABNORMAL LOW (ref 1.7–2.4)
Magnesium: 1.6 mg/dL — ABNORMAL LOW (ref 1.7–2.4)

## 2022-10-22 LAB — PHOSPHORUS
Phosphorus: 4.7 mg/dL — ABNORMAL HIGH (ref 2.5–4.6)
Phosphorus: 3.8 mg/dL (ref 2.5–4.6)

## 2022-10-22 LAB — CBC
HCT: 35.1 % — ABNORMAL LOW (ref 39.0–52.0)
Hemoglobin: 11.2 g/dL — ABNORMAL LOW (ref 13.0–17.0)
MCH: 28.6 pg (ref 26.0–34.0)
MCHC: 31.9 g/dL (ref 30.0–36.0)
MCV: 89.8 fL (ref 80.0–100.0)
Platelets: 240 10*3/uL (ref 150–400)
RBC: 3.91 MIL/uL — ABNORMAL LOW (ref 4.22–5.81)
RDW: 13.3 % (ref 11.5–15.5)
WBC: 14.7 10*3/uL — ABNORMAL HIGH (ref 4.0–10.5)
nRBC: 0 % (ref 0.0–0.2)

## 2022-10-22 LAB — BRAIN NATRIURETIC PEPTIDE: B Natriuretic Peptide: 24.9 pg/mL (ref 0.0–100.0)

## 2022-10-22 LAB — MRSA NEXT GEN BY PCR, NASAL: MRSA by PCR Next Gen: NOT DETECTED

## 2022-10-22 LAB — LACTIC ACID, PLASMA: Lactic Acid, Venous: 1.7 mmol/L (ref 0.5–1.9)

## 2022-10-22 LAB — TRIGLYCERIDES: Triglycerides: 86 mg/dL (ref <150)

## 2022-10-22 LAB — STREP PNEUMONIAE URINARY ANTIGEN: Strep Pneumo Urinary Antigen: NEGATIVE

## 2022-10-22 MED ORDER — ACETAMINOPHEN 160 MG/5ML PO SOLN
650.0000 mg | Freq: Four times a day (QID) | ORAL | Status: DC | PRN
  Administered 2022-10-22: 650 mg
  Filled 2022-10-22: qty 20.3

## 2022-10-22 MED ORDER — HEPARIN (PORCINE) 25000 UT/250ML-% IV SOLN
1700.0000 [IU]/h | INTRAVENOUS | Status: DC
  Administered 2022-10-22: 800 [IU]/h via INTRAVENOUS
  Administered 2022-10-23: 1200 [IU]/h via INTRAVENOUS
  Administered 2022-10-24: 1700 [IU]/h via INTRAVENOUS
  Filled 2022-10-22 (×3): qty 250

## 2022-10-22 MED ORDER — SODIUM CHLORIDE 0.9 % IV SOLN
2.0000 g | Freq: Three times a day (TID) | INTRAVENOUS | Status: DC
  Administered 2022-10-22 – 2022-10-23 (×2): 2 g via INTRAVENOUS
  Filled 2022-10-22 (×2): qty 12.5

## 2022-10-22 MED ORDER — ORAL CARE MOUTH RINSE: 15.0000 mL | OROMUCOSAL | Status: DC | PRN

## 2022-10-22 MED ORDER — PERFLUTREN LIPID MICROSPHERE
1.0000 mL | INTRAVENOUS | Status: AC | PRN
  Administered 2022-10-22: 6 mL via INTRAVENOUS

## 2022-10-22 MED ORDER — AMIODARONE LOAD VIA INFUSION
150.0000 mg | Freq: Once | INTRAVENOUS | Status: AC
  Administered 2022-10-22: 150 mg via INTRAVENOUS
  Filled 2022-10-22: qty 83.34

## 2022-10-22 MED ORDER — AMIODARONE HCL IN DEXTROSE 360-4.14 MG/200ML-% IV SOLN
30.0000 mg/h | INTRAVENOUS | Status: DC
  Administered 2022-10-23 – 2022-10-24 (×3): 30 mg/h via INTRAVENOUS
  Filled 2022-10-22 (×4): qty 200

## 2022-10-22 MED ORDER — CHLORHEXIDINE GLUCONATE CLOTH 2 % EX PADS
6.0000 | MEDICATED_PAD | Freq: Every day | CUTANEOUS | Status: DC
  Administered 2022-10-22 – 2022-11-07 (×20): 6 via TOPICAL

## 2022-10-22 MED ORDER — LACTATED RINGERS IV SOLN: INTRAVENOUS | Status: DC

## 2022-10-22 MED ORDER — HEPARIN BOLUS VIA INFUSION
3000.0000 [IU] | Freq: Once | INTRAVENOUS | Status: AC
  Administered 2022-10-22: 3000 [IU] via INTRAVENOUS
  Filled 2022-10-22: qty 3000

## 2022-10-22 MED ORDER — OSMOLITE 1.5 CAL PO LIQD
1000.0000 mL | ORAL | Status: DC
  Administered 2022-10-22 – 2022-10-23 (×3): 1000 mL
  Filled 2022-10-22 (×3): qty 1000

## 2022-10-22 MED ORDER — ORAL CARE MOUTH RINSE
15.0000 mL | OROMUCOSAL | Status: DC
  Administered 2022-10-22 – 2022-10-24 (×30): 15 mL via OROMUCOSAL

## 2022-10-22 MED ORDER — AMIODARONE HCL IN DEXTROSE 360-4.14 MG/200ML-% IV SOLN
60.0000 mg/h | INTRAVENOUS | Status: AC
  Administered 2022-10-22 – 2022-10-23 (×2): 60 mg/h via INTRAVENOUS
  Filled 2022-10-22: qty 200

## 2022-10-22 NOTE — Progress Notes (Signed)
ANTICOAGULATION CONSULT NOTE  Pharmacy Consult for heparin Indication: atrial fibrillation  Heparin Dosing Weight: 56.7 kg  Labs: Recent Labs    10/21/22 1833 10/21/22 1926 10/21/22 2123 10/21/22 2129 10/22/22 0044 10/22/22 0418 10/22/22 0454  HGB 14.3   < > 11.5*   < > 11.9* 11.6* 11.2*  HCT 45.1   < > 37.2*   < > 35.0* 34.0* 35.1*  PLT 301  --  161  --   --   --  240  APTT 43*  --   --   --   --   --   --   LABPROT 13.6  --   --   --   --   --   --   INR 1.1  --   --   --   --   --   --   CREATININE 1.41*  --  1.28*  --   --   --  1.05   < > = values in this interval not displayed.    Assessment: 23 yom with hx of anoxic brain injury, afib not on anticoagulation PTA presenting from SNF with septic shock secondary to PNA, now if afib with RVR. Pharmacy consulted to start heparin infusion. Patient previously on heparin 5000 units SQ q8h for VTE prophylaxis - last dose 1426. CBC stable. No bleed issues reported. SCr improved.  Goal of Therapy:  Heparin level 0.3-0.7 units/ml Monitor platelets by anticoagulation protocol: Yes   Plan:  D/c heparin SQ Heparin 3000 bolus x 1  Start heparin at 800 units/hr  Check 6hr heparin level Monitor daily CBC, s/sx bleeding   Arturo Morton, PharmD, BCPS Clinical Pharmacist 10/22/2022 9:13 PM

## 2022-10-22 NOTE — Progress Notes (Signed)
NAME:  [name], MRN:  [id number], DOB:  [birth year], LOS: 1 ADMISSION DATE:  10/21/2022, CONSULTATION DATE:  10/25 REFERRING MD:  Theresia Lo, EDP CHIEF COMPLAINT:  Hypoxia   History of Present Illness:  56 year old man admitted from [facility] for hypoxia and unresponsiveness. Limited information available. May have aspirated. Hypoxic to 70's and agitated in ED so intubated.    Chronic SNF resident due to demential and anoxic brain injury, gait difficulties. May be bed bound.    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  He eventually recovered but had subsequent anoxic brain injury.  He has been institution bound since approximately 2007.  His sister states that he has had a significant functional decline in the past 2 years since their mother passed away.  Pertinent  Medical History   Past Medical History:  Diagnosis Date   Addison disease (HCC)    Anoxic brain injury (HCC)    TBI   Anxiety    Aspiration pneumonia (HCC)    Atrial fibrillation (HCC)    Chronic constipation 11/23/2011   Dementia (HCC)    Dysphagia    Encephalopathy    Frontal lobe syndrome    Gait abnormality    freq falls   Glaucoma    Glucocorticoid deficiency (HCC)    History of heart attack    History of recurrent UTIs    Hyperlipemia    Hypothyroidism    Mental disorder    Myocardial infarction (HCC)    Pneumoperitoneum of unknown etiology 12/21/2011   Quadriplegia (HCC)    Reflux    Seizure disorder (HCC)    Weakness of both legs      Significant Hospital Events: Including procedures, antibiotic start and stop dates in addition to other pertinent events   10/25 - admitted via ED.  Interim History /  Subjective:  Remains sedated on propofol and fentanyl   Objective   Blood pressure 92/65, pulse 77, temperature 99.7 F (37.6 C), resp. rate (!) 9, height 5\' 8"  (1.727 m), weight 56.7 kg, SpO2 99 %.    Vent Mode: PRVC FiO2 (%):  [100 %] 100 % Set Rate:  [18 bmp-28 bmp] 28 bmp Vt Set:  [410 mL-540 mL] 410 mL PEEP:  [5 cmH20-10 cmH20] 10 cmH20 Plateau Pressure:  [16 cmH20-18 cmH20] 18 cmH20   Intake/Output Summary (Last 24 hours) at 10/22/2022 0704 Last data filed at 10/22/2022 0600 Gross per 24 hour  Intake 3742.21 ml  Output 300 ml  Net 3442.21 ml   Filed Weights   10/21/22 1917 10/22/22 0026 10/22/22 0500  Weight: 80 kg 56.7 kg 56.7 kg    Examination: General: Acutely ill-appearing cachectic middle-age man.  NAD. HENT: ET tube in place.  Dry mucous membrane Lungs: Rhonchorous anterior lung sounds.  No WOB. Cardiovascular: RRR.  No murmurs, rubs or gallops.   Abdomen: Soft.  NT/ND.  Normal BS. Extremities: Warm and dry. Neuro: Sedated.  Only responds to painful stimuli. GU: Foley in place.  Normal BMP, triglycerides 86 WBC 14.7, Hgb 11.2, platelet 240 ABG: pH 7.32, CO2 50.8, PO2 210  Resolved Hospital Problem list   AKI  Assessment & Plan:  Septic shock 2/2 pneumonia HCAP Continues  to require low-dose Precedex.  Remains sedated on propofol and fentanyl.  Lactic acid is slowly downtrending. -Continue cefepime and azithromycin -Continue IV LR -Wean Levophed for MAP goal >65 -F/u repeat lactic -Pending respiratory cultures  Acute respiratory failure with hypoxia (HCC) Respiratory acidosis Improved FiO2 from 100 to 80%.  Last ABG with PCO2 of 50.  RR currently at 28 to help with respiratory acidosis. -Repeat ABG -Continue full vent support -SBT when more awake   Protein calorie malnutrition (HCC) -Continue tube feeds   Cardiomyopathy, ischemic Last TTE 09/2017 showed EF 60 to 65% and akinesis of the basal inferior myocardium. No evidence of heart failure  exacerbation. -TTE -Monitor and replete electrolytes -Strict I&O's  Best Practice (right click and "Reselect all SmartList Selections" daily)   Diet/type: tubefeeds DVT prophylaxis: LMWH GI prophylaxis: PPI Lines: N/A Foley:  Yes, and it is still needed Code Status:  limited Last date of multidisciplinary goals of care discussion [Pending sister update today]  Labs   CBC: Recent Labs  Lab 10/21/22 1833 10/21/22 1926 10/21/22 2123 10/21/22 2129 10/22/22 0044 10/22/22 0418 10/22/22 0454  WBC 13.6*  --  10.9*  --   --   --  14.7*  NEUTROABS 11.9*  --   --   --   --   --   --   HGB 14.3   < > 11.5* 10.9* 11.9* 11.6* 11.2*  HCT 45.1   < > 37.2* 32.0* 35.0* 34.0* 35.1*  MCV 92.0  --  93.7  --   --   --  89.8  PLT 301  --  161  --   --   --  240   < > = values in this interval not displayed.    Basic Metabolic Panel: Recent Labs  Lab 10/21/22 1833 10/21/22 1926 10/21/22 2123 10/21/22 2129 10/22/22 0044 10/22/22 0418 10/22/22 0454  NA 142 142  --  141 142 142 141  K 4.2 3.6  --  3.9 4.3 4.4 4.3  CL 107  --   --   --   --   --  107  CO2 18*  --   --   --   --   --  22  GLUCOSE 129*  --   --   --   --   --  87  BUN 26*  --   --   --   --   --  24*  CREATININE 1.41*  --  1.28*  --   --   --  1.05  CALCIUM 8.9  --   --   --   --   --  8.4*  MG  --   --  1.7  --   --   --  1.7  PHOS  --   --  3.5  --   --   --  4.7*   GFR: Estimated Creatinine Clearance: 63.8 mL/min (by C-G formula based on SCr of 1.05 mg/dL). Recent Labs  Lab 10/21/22 1827 10/21/22 1833 10/21/22 2123 10/21/22 2148 10/22/22 0454  WBC  --  13.6* 10.9*  --  14.7*  LATICACIDVEN 4.8*  --   --  3.0*  --     Liver Function Tests: Recent Labs  Lab 10/21/22 1833  AST 19  ALT 7  ALKPHOS 56  BILITOT 0.4  PROT 7.7  ALBUMIN 2.5*   No results for input(s): "LIPASE", "AMYLASE" in the last 168 hours. No results for input(s): "AMMONIA" in the last 168  hours.  ABG    Component Value Date/Time    PHART 7.328 (L) 10/22/2022 0418   PCO2ART 50.8 (H) 10/22/2022 0418   PO2ART 210 (H) 10/22/2022 0418   HCO3 26.5 10/22/2022 0418   TCO2 28 10/22/2022 0418   ACIDBASEDEF 4.0 (H) 10/22/2022 0044   O2SAT 100 10/22/2022 0418     Coagulation Profile: Recent Labs  Lab 10/21/22 1833  INR 1.1    Cardiac Enzymes: No results for input(s): "CKTOTAL", "CKMB", "CKMBINDEX", "TROPONINI" in the last 168 hours.  HbA1C: Hgb A1c MFr Bld  Date/Time Value Ref Range Status  11/24/2011 05:15 AM 4.6 <5.7 % Final    Comment:    (NOTE)                                                                       According to the ADA Clinical Practice Recommendations for 2011, when HbA1c is used as a screening test:  >=6.5%   Diagnostic of Diabetes Mellitus           (if abnormal result is confirmed) 5.7-6.4%   Increased risk of developing Diabetes Mellitus References:Diagnosis and Classification of Diabetes Mellitus,Diabetes Care,2011,34(Suppl 1):S62-S69 and Standards of Medical Care in         Diabetes - 2011,Diabetes Care,2011,34 (Suppl 1):S11-S61.  10/05/2011 05:10 AM 5.0 <5.7 % Final    Comment:    (NOTE)                                                                       According to the ADA Clinical Practice Recommendations for 2011, when HbA1c is used as a screening test:  >=6.5%   Diagnostic of Diabetes Mellitus           (if abnormal result is confirmed) 5.7-6.4%   Increased risk of developing Diabetes Mellitus References:Diagnosis and Classification of Diabetes Mellitus,Diabetes Care,2011,34(Suppl 1):S62-S69 and Standards of Medical Care in         Diabetes - 2011,Diabetes Care,2011,34 (Suppl 1):S11-S61.    CBG: Recent Labs  Lab 10/21/22 2217 10/22/22 0030 10/22/22 0350  GLUCAP 97 91 86    Review of Systems:   As in HPI  Past Medical History:  He,  has a past medical history of Addison disease (HCC), Anoxic brain injury (HCC), Anxiety, Aspiration pneumonia (HCC), Atrial  fibrillation (HCC), Chronic constipation (11/23/2011), Dementia (HCC), Dysphagia, Encephalopathy, Frontal lobe syndrome, Gait abnormality, Glaucoma, Glucocorticoid deficiency (HCC), History of heart attack, History of recurrent UTIs, Hyperlipemia, Hypothyroidism, Mental disorder, Myocardial infarction (HCC), Pneumoperitoneum of unknown etiology (12/21/2011), Quadriplegia (HCC), Reflux, Seizure disorder (HCC), and Weakness of both legs.   Surgical History:   Past Surgical History:  Procedure Laterality Date   DEBRIDEMENT OF ABDOMINAL WALL ABSCESS  01/26/2018   HERNIA MESH REMOVAL  01/26/2018   Infected mesh - removed w I&D abd wall abscess   IR GENERIC HISTORICAL  10/20/2016   IR REPLC GASTRO/COLONIC TUBE PERCUT W/FLUORO 10/20/2016 Oley Balm, MD [facility]   IR REPLACE G-TUBE SIMPLE WO  FLUORO  01/28/2018   IR REPLC GASTRO/COLONIC TUBE PERCUT W/FLUORO  04/21/2017   IR REPLC GASTRO/COLONIC TUBE PERCUT W/FLUORO  07/22/2017   IR REPLC GASTRO/COLONIC TUBE PERCUT W/FLUORO  11/24/2017   IR REPLC GASTRO/COLONIC TUBE PERCUT W/FLUORO  08/12/2020   IRRIGATION AND DEBRIDEMENT ABSCESS N/A 01/26/2018   Procedure: IRRIGATION AND DEBRIDEMENT ABDOMINAL WALL ABSCESS WITH REMOVAL OF INFECTED MESH;  Surgeon: Karie Soda, MD;  Location: [facility];  Service: General;  Laterality: N/A;   NEPHRECTOMY  unknown   PEG TUBE PLACEMENT       Social History:   reports that he has never smoked. He has never used smokeless tobacco. He reports that he does not drink alcohol and does not use drugs.   Family History:  His family history is not on file.   Allergies Allergies  Allergen Reactions   Ativan [Lorazepam] Other (See Comments)    Unknown (not listed on MAR)   Latex Dermatitis    Reaction not listed on MAR   Morphine And Related Other (See Comments)    Reaction not listed on MAR   Other Other (See Comments)    Soy Sauce  Reaction not listed on MAR   Penicillins Other (See Comments)    Patient tolerated  cephalosporins in past.  Reaction not listed on MAR.  Has patient had a PCN reaction causing immediate rash, facial/tongue/throat swelling, SOB or lightheadedness with hypotension: unknown Has patient had a PCN reaction causing severe rash involving mucus membranes or skin necrosis: unknown Has patient had a PCN reaction that required hospitalization: no Has patient had a PCN reaction occurring within the last 10 years: yes If all of the above answe   Pyridium [Phenazopyridine Hcl] Other (See Comments)    Reaction not listed on MAR   Soy Allergy Other (See Comments)    Reaction not listed on MAR   Soybean (Diagnostic) Other (See Comments)    Reaction not listed no MAR     Home Medications  Prior to Admission medications   Medication Sig Start Date End Date Taking? Authorizing Provider  acetaminophen (TYLENOL) 325 MG tablet Take 650 mg by mouth every 4 (four) hours as needed (pain).   Yes [provider]  albuterol (ACCUNEB) 0.63 MG/3ML nebulizer solution Take 1 ampule by nebulization every 6 (six) hours as needed for wheezing or shortness of breath (COPD).   Yes [provider]  aspirin 81 MG chewable tablet Chew 81 mg by mouth every morning. 0900   Yes [provider]  calcium carbonate (TUMS - DOSED IN MG ELEMENTAL CALCIUM) 500 MG chewable tablet Chew 1 tablet by mouth every 12 (twelve) hours as needed for indigestion or heartburn.   Yes [provider]  Carboxymeth-Glycerin-Polysorb (REFRESH OPTIVE ADVANCED) 0.5-1-0.5 % SOLN Place 1 drop into both eyes 3 (three) times daily. 0900, 1400, 2100   Yes [provider]  clonazePAM (KLONOPIN) 0.5 MG tablet Take 0.5 mg by mouth 2 (two) times daily. 0830 and 1630   Yes [provider]  divalproex (DEPAKOTE SPRINKLE) 125 MG capsule Take 500 mg by mouth 3 (three) times daily. 0830, 1230, and 2030 07/18/13  Yes [provider]  docusate sodium (COLACE) 100 MG capsule Take 100 mg by mouth  every morning. 0800   Yes [provider]  haloperidol (HALDOL) 5 MG tablet Take 5 mg by mouth 2 (two) times daily. 0830 and 2030   Yes [provider]  levothyroxine (SYNTHROID) 200 MCG tablet Take 200 mcg by mouth  daily before breakfast. Take with a 25 mcg tablet for a total dose of 225 mcg every morning 0630   Yes [provider]  levothyroxine (SYNTHROID) 25 MCG tablet Take 25 mcg by mouth daily before breakfast. Give 92mcg with current 200 mcg dose for 225 mcg 0630   Yes [provider]  Magnesium Hydroxide (MILK OF MAGNESIA PO) Take 30 mLs by mouth daily as needed (constipation).   Yes [provider]  omeprazole (PRILOSEC) 20 MG capsule Take 20 mg by mouth at bedtime.   Yes [provider]  polyethylene glycol (MIRALAX / GLYCOLAX) packet Take 17 g by mouth 2 (two) times daily. 0900 and 2100   Yes [provider]  senna (SENOKOT) 8.6 MG tablet Take 2 tablets by mouth 2 (two) times daily. 0900 and 2000   Yes [provider]  sertraline (ZOLOFT) 50 MG tablet Take 75 mg by mouth every morning. 0900   Yes [provider]  cephALEXin (KEFLEX) 500 MG capsule Take 1 capsule (500 mg total) by mouth 4 (four) times daily. Patient not taking: Reported on 05/24/2022 05/15/22   Daleen Bo, MD  fosfomycin (MONUROL) 3 g PACK Take 3 g by mouth See admin instructions. Give 3 g packet by mouth one time a day for UTI for one day  0900    [provider]  pantoprazole (PROTONIX) 40 MG tablet Take 40 mg by mouth daily. 0700    [provider]  pantoprazole sodium (PROTONIX) 40 mg/20 mL PACK Take 40 mg by mouth every morning. Patient not taking: Reported on 05/24/2022 11/30/11   Ernest Haber, MD  traMADol (ULTRAM) 50 MG tablet Take 50 mg by mouth every 6 (six) hours as needed (pain).    [provider]     Critical care time: 38

## 2022-10-22 NOTE — Progress Notes (Signed)
[facility] Progress Note Patient Name: [name] DOB: July 05, 1966 MRN: [id number]   Date of Service  10/22/2022  HPI/Events of Note  Fever to 101.1 F - Nursing request for Tylenol. AST and ALT both normal. Already on Vancomycin and Cefepime.   eICU Interventions  Plan: Tylenol liquid 650 mg per tube Q 6 hours PRN Temp > 101.0 F.     Intervention Category Major Interventions: Other:  Lysle Dingwall 10/22/2022, 8:50 PM

## 2022-10-22 NOTE — Progress Notes (Signed)
Initial Nutrition Assessment  DOCUMENTATION CODES:   Non-severe (moderate) malnutrition in context of social or environmental circumstances  INTERVENTION:  Initiate tube feeding via PEG: Osmolite 1.5 at 50 ml/h (1200 ml per day) Start at 20 and advance by 10 q6h to goal of 50 Prosource TF20 60 ml 1x/d Provides 1880 kcal, 95 gm protein, 914 ml free water daily Request new measured wt, variable hx  NUTRITION DIAGNOSIS:   Moderate Malnutrition related to social / environmental circumstances (dysphagia, SNF resident, ABI) as evidenced by moderate fat depletion, moderate muscle depletion.  GOAL:   Patient will meet greater than or equal to 90% of their needs  MONITOR:   Vent status, Weight trends, Labs, TF tolerance  REASON FOR ASSESSMENT:   Consult Enteral/tube feeding initiation and management  ASSESSMENT:   Pt with hx of ABI s/p cardiac arrest, HLD, GERD, cardiomyopathy, and possible quadriplegia presented to ED from SNF with worsening respiratory status. Found to be septic due to pneumonia. PEG at baseline and A&O x 2.   Patient is currently intubated on ventilator support. No family in room able to provide a nutrition hx. Discussed in rounds.   Pt from [facility] and per chart review has required long term SNF care since 2007 after ABI s/p cardiac arrest. Progressive decline x2 years. PEG in place, unclear what TF regimen was at SNF, no records available and no answer when SNF was called. Pt has a soy allergy listed with no noted reaction with is present in most TF formulas. Discussed with pharmacy, pt has received soy containing nutrition supplements since allergy was added, will use a standard formula for TF.   TF protocol entered overnight but no infusion started yet. Discussed with RN that formula would be changed. Pt thin on exam with significant muscle and fat depletions noted. Unclear if pt is bed bound and/or quadriplegia as limited hx is available in chart.     Noted that MD discussed pt's poor prognosis with family who has agreed to aggressive care x 48 hours to monitor for signs of improvement. If none seen, may consider comfort care.   MV: 10.6 L/min Temp (24hrs), Avg:99.8 F (37.7 C), Min:99 F (37.2 C), Max:101.9 F (38.8 C)  Propofol: 9.6 ml/hr (253kcal/d, not included in TF)   Intake/Output Summary (Last 24 hours) at 10/22/2022 1446 Last data filed at 10/22/2022 1300 Gross per 24 hour  Intake 5346.93 ml  Output 340 ml  Net 5006.93 ml  Net IO Since Admission: 5,006.93 mL [10/22/22 1446]  Nutritionally Relevant Medications: Scheduled Meds:  PROSource TF20  60 mL Per Tube Daily   insulin aspart  1-3 Units Subcutaneous Q4H   Continuous Infusions:  feeding supplement (OSMOLITE 1.5 CAL)     lactated ringers 150 mL/hr at 10/22/22 1300   norepinephrine (LEVOPHED) Adult infusion 6 mcg/min (10/22/22 1300)   propofol (DIPRIVAN) infusion 20 mcg/kg/min (10/22/22 1300)   PRN Meds: docusate, ondansetron, polyethylene glycol  Labs Reviewed: BUN 24 Phos 4.7  NUTRITION - FOCUSED PHYSICAL EXAM: Flowsheet Row Most Recent Value  Orbital Region Moderate depletion  Upper Arm Region Mild depletion  Thoracic and Lumbar Region Mild depletion  Buccal Region Moderate depletion  Temple Region Moderate depletion  Clavicle Bone Region Moderate depletion  Clavicle and Acromion Bone Region Moderate depletion  Scapular Bone Region Unable to assess  Dorsal Hand Mild depletion  Patellar Region Severe depletion  Anterior Thigh Region Severe depletion  Posterior Calf Region Severe depletion  Edema (RD Assessment) None  Hair  Reviewed  Eyes Unable to assess  Mouth Reviewed  Skin Reviewed  Nails Reviewed    Diet Order:   Diet Order             Diet NPO time specified  Diet effective now                   EDUCATION NEEDS:  Not appropriate for education at this time  Skin:  Skin Assessment: Reviewed RN Assessment (Stage 1  buttocks)  Last BM:     Height:  Ht Readings from Last 1 Encounters:  10/22/22 5\' 8"  (1.727 m)    Weight:  Wt Readings from Last 1 Encounters:  10/22/22 56.7 kg    Ideal Body Weight:  70 kg  BMI:  Body mass index is 19.01 kg/m.  Estimated Nutritional Needs:  Kcal:  1800-2000 kcal/d Protein:  85-100g/d Fluid:  1.8-2L/d    10/24/22, RD, LDN Clinical Dietitian RD pager # available in [facility]  After hours/weekend pager # available in [facility]

## 2022-10-22 NOTE — Progress Notes (Addendum)
[facility] Progress Note Patient Name: [name] DOB: 14-Jan-1966 MRN: [id number]   Date of Service  10/22/2022  HPI/Events of Note  AFIB with RVR - Ventricular rate = 160's to 170's. Patient is on a Norepinephrine IV infusion. BP = 89/70.  eICU Interventions  Plan: Amiodarone IV bolus and infusion. BMP and Mg++ level STAT. Heparin IV infusion per pharmacy consult. D/C Heparin [place]. 12 Lead EKG now.     Intervention Category Major Interventions: Arrhythmia - evaluation and management  Xandra Laramee Eugene 10/22/2022, 9:12 PM

## 2022-10-22 NOTE — Progress Notes (Signed)
Per ABG results RT increased rate from 24 to 28 and document under vent flow sheet.

## 2022-10-22 NOTE — Progress Notes (Signed)
RT and RN transported vent patient from ED to 78M ICU. Vital signs stable through out.

## 2022-10-23 DIAGNOSIS — J189 Pneumonia, unspecified organism: Secondary | ICD-10-CM

## 2022-10-23 DIAGNOSIS — E46 Unspecified protein-calorie malnutrition: Secondary | ICD-10-CM

## 2022-10-23 DIAGNOSIS — J9601 Acute respiratory failure with hypoxia: Secondary | ICD-10-CM | POA: Diagnosis not present

## 2022-10-23 DIAGNOSIS — J9602 Acute respiratory failure with hypercapnia: Secondary | ICD-10-CM

## 2022-10-23 DIAGNOSIS — G931 Anoxic brain damage, not elsewhere classified: Secondary | ICD-10-CM

## 2022-10-23 LAB — BLOOD GAS, ARTERIAL
Acid-Base Excess: 0.6 mmol/L (ref 0.0–2.0)
Bicarbonate: 26.6 mmol/L (ref 20.0–28.0)
O2 Saturation: 100 %
Patient temperature: 36.8
pCO2 arterial: 47 mmHg (ref 32–48)
pH, Arterial: 7.36 (ref 7.35–7.45)
pO2, Arterial: 91 mmHg (ref 83–108)

## 2022-10-23 LAB — COMPREHENSIVE METABOLIC PANEL
ALT: 6 U/L (ref 0–44)
AST: 11 U/L — ABNORMAL LOW (ref 15–41)
Albumin: 1.6 g/dL — ABNORMAL LOW (ref 3.5–5.0)
Alkaline Phosphatase: 40 U/L (ref 38–126)
Anion gap: 7 (ref 5–15)
BUN: 18 mg/dL (ref 6–20)
CO2: 24 mmol/L (ref 22–32)
Calcium: 8.2 mg/dL — ABNORMAL LOW (ref 8.9–10.3)
Chloride: 108 mmol/L (ref 98–111)
Creatinine, Ser: 0.76 mg/dL (ref 0.61–1.24)
GFR, Estimated: >60 mL/min (ref >60)
Glucose, Bld: 113 mg/dL — ABNORMAL HIGH (ref 70–99)
Potassium: 3.8 mmol/L (ref 3.5–5.1)
Sodium: 139 mmol/L (ref 135–145)
Total Bilirubin: 0.3 mg/dL (ref 0.3–1.2)
Total Protein: 5.3 g/dL — ABNORMAL LOW (ref 6.5–8.1)

## 2022-10-23 LAB — PHOSPHORUS: Phosphorus: 3.1 mg/dL (ref 2.5–4.6)

## 2022-10-23 LAB — URINE CULTURE: Culture: NO GROWTH

## 2022-10-23 LAB — CBC WITH DIFFERENTIAL/PLATELET
Abs Immature Granulocytes: 0.07 10*3/uL (ref 0.00–0.07)
Basophils Absolute: 0 10*3/uL (ref 0.0–0.1)
Basophils Relative: 0 %
Eosinophils Absolute: 0.1 10*3/uL (ref 0.0–0.5)
Eosinophils Relative: 1 %
HCT: 30.4 % — ABNORMAL LOW (ref 39.0–52.0)
Hemoglobin: 9.8 g/dL — ABNORMAL LOW (ref 13.0–17.0)
Immature Granulocytes: 1 %
Lymphocytes Relative: 13 %
Lymphs Abs: 1.5 10*3/uL (ref 0.7–4.0)
MCH: 29.2 pg (ref 26.0–34.0)
MCHC: 32.2 g/dL (ref 30.0–36.0)
MCV: 90.5 fL (ref 80.0–100.0)
Monocytes Absolute: 1.2 10*3/uL — ABNORMAL HIGH (ref 0.1–1.0)
Monocytes Relative: 11 %
Neutro Abs: 8.3 10*3/uL — ABNORMAL HIGH (ref 1.7–7.7)
Neutrophils Relative %: 74 %
Platelets: 186 10*3/uL (ref 150–400)
RBC: 3.36 MIL/uL — ABNORMAL LOW (ref 4.22–5.81)
RDW: 13.3 % (ref 11.5–15.5)
WBC: 11.2 10*3/uL — ABNORMAL HIGH (ref 4.0–10.5)
nRBC: 0 % (ref 0.0–0.2)

## 2022-10-23 LAB — GLUCOSE, CAPILLARY
Glucose-Capillary: 105 mg/dL — ABNORMAL HIGH (ref 70–99)
Glucose-Capillary: 110 mg/dL — ABNORMAL HIGH (ref 70–99)
Glucose-Capillary: 124 mg/dL — ABNORMAL HIGH (ref 70–99)
Glucose-Capillary: 115 mg/dL — ABNORMAL HIGH (ref 70–99)
Glucose-Capillary: 123 mg/dL — ABNORMAL HIGH (ref 70–99)
Glucose-Capillary: 94 mg/dL (ref 70–99)

## 2022-10-23 LAB — MAGNESIUM: Magnesium: 1.7 mg/dL (ref 1.7–2.4)

## 2022-10-23 LAB — HEPARIN LEVEL (UNFRACTIONATED)
Heparin Unfractionated: <0.1 IU/mL — ABNORMAL LOW (ref 0.30–0.70)
Heparin Unfractionated: <0.1 IU/mL — ABNORMAL LOW (ref 0.30–0.70)
Heparin Unfractionated: <0.1 IU/mL — ABNORMAL LOW (ref 0.30–0.70)

## 2022-10-23 MED ORDER — DEXMEDETOMIDINE HCL IN NACL 400 MCG/100ML IV SOLN
INTRAVENOUS | Status: AC
  Administered 2022-10-23: 0.4 ug/kg/h via INTRAVENOUS
  Filled 2022-10-23: qty 100

## 2022-10-23 MED ORDER — LEVOTHYROXINE SODIUM 25 MCG PO TABS
225.0000 ug | ORAL_TABLET | Freq: Every day | ORAL | Status: DC
  Administered 2022-10-24: 225 ug
  Filled 2022-10-23: qty 1

## 2022-10-23 MED ORDER — SERTRALINE HCL 50 MG PO TABS
75.0000 mg | ORAL_TABLET | Freq: Every day | ORAL | Status: DC
  Administered 2022-10-23 – 2022-10-25 (×3): 75 mg
  Filled 2022-10-23 (×3): qty 2

## 2022-10-23 MED ORDER — CLONAZEPAM 0.5 MG PO TABS
0.5000 mg | ORAL_TABLET | Freq: Two times a day (BID) | ORAL | Status: DC
  Administered 2022-10-23 – 2022-10-25 (×4): 0.5 mg
  Filled 2022-10-23 (×5): qty 1

## 2022-10-23 MED ORDER — MAGNESIUM SULFATE 4 GM/100ML IV SOLN
4.0000 g | Freq: Once | INTRAVENOUS | Status: AC
  Administered 2022-10-23: 4 g via INTRAVENOUS
  Filled 2022-10-23: qty 100

## 2022-10-23 MED ORDER — LEVOTHYROXINE SODIUM 100 MCG PO TABS
200.0000 ug | ORAL_TABLET | Freq: Every day | ORAL | Status: DC
  Administered 2022-10-23: 200 ug
  Filled 2022-10-23: qty 2

## 2022-10-23 MED ORDER — SENNOSIDES-DOCUSATE SODIUM 8.6-50 MG PO TABS
1.0000 | ORAL_TABLET | Freq: Two times a day (BID) | ORAL | Status: DC
  Administered 2022-10-23 (×2): 1
  Filled 2022-10-23 (×3): qty 1

## 2022-10-23 MED ORDER — HEPARIN BOLUS VIA INFUSION
1500.0000 [IU] | Freq: Once | INTRAVENOUS | Status: AC
  Administered 2022-10-23: 1500 [IU] via INTRAVENOUS
  Filled 2022-10-23: qty 1500

## 2022-10-23 MED ORDER — IPRATROPIUM-ALBUTEROL 0.5-2.5 (3) MG/3ML IN SOLN
3.0000 mL | Freq: Three times a day (TID) | RESPIRATORY_TRACT | Status: DC
  Administered 2022-10-23: 3 mL via RESPIRATORY_TRACT
  Filled 2022-10-23: qty 3

## 2022-10-23 MED ORDER — SODIUM CHLORIDE 0.9 % IV SOLN
2.0000 g | INTRAVENOUS | Status: AC
  Administered 2022-10-23 – 2022-10-26 (×4): 2 g via INTRAVENOUS
  Filled 2022-10-23 (×4): qty 20

## 2022-10-23 MED ORDER — IPRATROPIUM-ALBUTEROL 0.5-2.5 (3) MG/3ML IN SOLN
3.0000 mL | Freq: Two times a day (BID) | RESPIRATORY_TRACT | Status: DC
  Administered 2022-10-23 – 2022-10-26 (×7): 3 mL via RESPIRATORY_TRACT
  Filled 2022-10-23 (×7): qty 3

## 2022-10-23 MED ORDER — POLYETHYLENE GLYCOL 3350 17 G PO PACK
17.0000 g | PACK | Freq: Every day | ORAL | Status: DC
  Administered 2022-10-23: 17 g
  Filled 2022-10-23: qty 1

## 2022-10-23 MED ORDER — DEXMEDETOMIDINE HCL IN NACL 400 MCG/100ML IV SOLN
0.4000 ug/kg/h | INTRAVENOUS | Status: DC
  Administered 2022-10-23 – 2022-10-24 (×3): 0.8 ug/kg/h via INTRAVENOUS
  Administered 2022-10-24 – 2022-10-25 (×3): 0.4 ug/kg/h via INTRAVENOUS
  Administered 2022-10-26: 1.1 ug/kg/h via INTRAVENOUS
  Administered 2022-10-26: 0.9 ug/kg/h via INTRAVENOUS
  Filled 2022-10-23 (×6): qty 100

## 2022-10-23 MED ORDER — LEVOTHYROXINE SODIUM 25 MCG PO TABS
25.0000 ug | ORAL_TABLET | Freq: Every day | ORAL | Status: DC
  Administered 2022-10-23: 25 ug
  Filled 2022-10-23: qty 1

## 2022-10-23 MED ORDER — PANTOPRAZOLE 2 MG/ML SUSPENSION
40.0000 mg | Freq: Every day | ORAL | Status: DC
  Administered 2022-10-23: 40 mg
  Filled 2022-10-23 (×3): qty 20

## 2022-10-23 MED ORDER — HEPARIN BOLUS VIA INFUSION
2000.0000 [IU] | Freq: Once | INTRAVENOUS | Status: AC
  Administered 2022-10-23: 2000 [IU] via INTRAVENOUS
  Filled 2022-10-23: qty 2000

## 2022-10-23 NOTE — Progress Notes (Signed)
[facility] Progress Note Patient Name: [name] DOB: 1966/04/02 MRN: [id number]   Date of Service  10/23/2022  HPI/Events of Note  Notified by RN regarding ?superficial abdominal mass on LUQ, warm to touch. Also prior GI tube site in RUQ with purulence  Pressor requirement improving WBC improving  eICU Interventions  Obtain wound culture Order abdominal US. May need CT in am     Intervention Category Minor Interventions: Clinical assessment - ordering diagnostic tests  [name] 10/23/2022, 10:46 PM

## 2022-10-23 NOTE — Progress Notes (Signed)
[facility] ADULT ICU REPLACEMENT PROTOCOL   The patient does apply for the [facility] Adult ICU Electrolyte Replacment Protocol based on the criteria listed below:   1.Exclusion criteria: TCTS patients, ECMO patients, and Dialysis patients 2. Is GFR >/= 30 ml/min? Yes.    Patient's GFR today is >60 3. Is SCr </= 2? Yes.   Patient's SCr is 0.88 mg/dL 4. Did SCr increase >/= 0.5 in 24 hours? No. 5.Pt's weight >40kg  Yes.   6. Abnormal electrolyte(s): mag 1.6  7. Electrolytes replaced per protocol 8.  Call MD STAT for K+ </= 2.5, Phos </= 1, or Mag </= 1 Physician:  n/a  [name] 10/23/2022 3:06 AM

## 2022-10-23 NOTE — Progress Notes (Signed)
ANTICOAGULATION CONSULT NOTE - Follow Up Consult  Pharmacy Consult for heparin Indication: atrial fibrillation  Heparin dosing weight: 56.7kg  Labs: Recent Labs    10/21/22 1833 10/21/22 1926 10/21/22 2123 10/21/22 2129 10/21/22 2148 10/22/22 0044 10/22/22 0418 10/22/22 0454 10/22/22 2216 10/23/22 0431 10/23/22 1146  HGB 14.3   < > 11.5*   < >  --    < > 11.6* 11.2*  --  9.8*  --   HCT 45.1   < > 37.2*   < >  --    < > 34.0* 35.1*  --  30.4*  --   PLT 301  --  161  --   --   --   --  240  --  186  --   APTT 43*  --   --   --   --   --   --   --   --   --   --   LABPROT 13.6  --   --   --   --   --   --   --   --   --   --   INR 1.1  --   --   --   --   --   --   --   --   --   --   HEPARINUNFRC  --   --   --   --   --   --   --   --   --  <0.10* <0.10*  CREATININE 1.41*  --  1.28*  --   --   --   --  1.05 0.88 0.76  --   TROPONINIHS 11  --   --   --  16  --   --   --   --   --   --    < > = values in this interval not displayed.    Assessment: 56yo male subtherapeutic at <0.10 on heparin with initial dosing for Afib. There was no issues running the infusion or any signs/symptoms of bleeding. Hgb 9.8 and plts 186  Goal of Therapy:  Heparin level 0.3-0.7 units/ml   Plan:  Re-bolus with heparin 1500 units (56.7kg *30 units/kg) Increase heparin infusion by to 1200 units/hr and check level in 6 hours ( 4 units/kg/hr *56.7) Monitor CBC and for signs/symptoms of bleeding daily  Sandford Craze, PharmD. [facility] Acute Care PGY-1  10/23/2022 1:13 PM

## 2022-10-23 NOTE — Progress Notes (Signed)
NAME:  [name], MRN:  [id number], DOB:  April 22, 1966, LOS: 2 ADMISSION DATE:  10/21/2022, CONSULTATION DATE:  10/25 REFERRING MD:  Maylon Peppers, EDP CHIEF COMPLAINT:  Hypoxia   History of Present Illness:  56 year old man admitted from [facility] for hypoxia and unresponsiveness. Limited information available. May have aspirated. Hypoxic to 70's and agitated in ED so intubated.    Chronic SNF resident due to demential and anoxic brain injury, gait difficulties. May be bed bound.    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  He eventually recovered but had subsequent anoxic brain injury.  He has been institution bound since approximately 2007.  His sister states that he has had a significant functional decline in the past 2 years since their mother passed away.  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 10/27: A-fib overnight,  started amiodarone drip  Interim History / Subjective:  Patient developed A-fib overnight requiring initiation of amiodarone Converted back to normal sinus overnight Patient on pressure support in the morning, became agitated and started on Precedex. Patient went back on vent support then back on pressure support with increased agitation.  Now on full vent support  Objective   Blood pressure 101/65, pulse 65, temperature 99.3 F (37.4 C), resp. rate 16, height 5\' 8"  (1.727 m), weight 61.3 kg, SpO2 97 %.    Vent Mode: PRVC FiO2 (%):  [30 %-40 %] 30 % Set Rate:  [28 bmp] 28 bmp Vt Set:  [410 mL] 410 mL PEEP:  [5 cmH20-10 cmH20] 5 cmH20 Pressure Support:  [5 cmH20-8 cmH20] 8 cmH20 Plateau Pressure:  [15 cmH20-18 cmH20] 16 cmH20   Intake/Output Summary (Last 24 hours) at 10/23/2022 1324 Last data filed at 10/23/2022 1158 Gross per 24 hour  Intake 3447.66 ml  Output 1070 ml  Net 2377.66 ml   Filed Weights   10/22/22 0026 10/22/22 0500 10/23/22 0500  Weight: 56.7 kg 56.7 kg 61.3 kg   Examination: General: Acutely ill and cachectic middle-age man.  Agitated. HENT: ET tube in place.  Dry mucous membrane Lungs: Rhonchorous anterior lung sounds.  No WOB. Cardiovascular: RRR. No murmurs, rubs or gallops.   Abdomen: Soft.  NT/ND.  Normal BS. Extremities: Warm  and dry. Neuro: Awake and agitated, trying to get up.  Moving all extremities.  Does not follow commands. GU: Foley in place.  CMP unremarkable, WBC trended down to 11.2, Hgb 9.8, platelet 186 Mag 1.7, CBGs 100s to 120s  Resolved Hospital Problem list   AKI  Assessment & Plan:  Septic shock 2/2 pneumonia HCAP Pressor needs decreasing with Levophed down to 6-8.  Lactic acidosis resolved.  Respiratory culture Gram stain show gram-positive rods, rare yeast with pseudohyphae.  We will discontinue azithromycin and de-escalate antibiotics. -Discontinue cefepime and azithromycin -Start ceftriaxone -Continue IV LR -Wean  Levophed for MAP goal >65  Acute respiratory failure with hypoxia (HCC) Respiratory acidosis Patient agitated and trying to get up this morning requiring addition of Precedex to propofol and fentanyl.  Repeat ABG within normal limits. Failed SBT due to increased agitation.  We will slowly wean down sedation and retrial SBT. -Continue full vent support -SBT later today -Continue Precedex, propofol and fentanyl, wean as able   New onset A-fib Patient found to be in A-fib with RVR overnight and started on amiodarone.  She converted to normal sinus earlier this morning.  A-fib likely in the setting of acute critical illness.  Discharge facets low at 1. We will continue IV heparin he will likely not need long-term anticoagulation. -Continue IV heparin -Continue IV amiodarone for now -Telemetry -Replete electrolytes   Cardiomyopathy, ischemic Last TTE 09/2017 showed EF 60 to 65% and akinesis of the basal inferior myocardium. Repeat TTE showed EF 50 to 55%, with global hypokinesis but no valvular abnormalities. -Monitor and replete electrolytes -Strict I&O's  Hypothyroidism -Continue Synthroid to 225 mcg daily -Repeat TSH  Hx adrenal insufficiency -A.m. cortisol  Protein calorie malnutrition (HCC) -Continue tube feeds  Best Practice (right click and "Reselect all SmartList Selections" daily)   Diet/type: tubefeeds DVT prophylaxis: LMWH GI prophylaxis: PPI Lines: N/A Foley:  Yes, and it is still needed Code Status:  limited Last date of multidisciplinary goals of care discussion [Pending sister update today]  Labs   CBC: Recent Labs  Lab 10/21/22 1833 10/21/22 1926 10/21/22 2123 10/21/22 2129 10/22/22 0044 10/22/22 0418 10/22/22 0454 10/23/22 0431  WBC 13.6*  --  10.9*  --   --   --  14.7* 11.2*  NEUTROABS 11.9*  --   --   --   --   --   --  8.3*  HGB 14.3   < > 11.5* 10.9* 11.9* 11.6* 11.2* 9.8*  HCT 45.1   < > 37.2* 32.0* 35.0* 34.0* 35.1* 30.4*  MCV 92.0  --  93.7   --   --   --  89.8 90.5  PLT 301  --  161  --   --   --  240 186   < > = values in this interval not displayed.    Basic Metabolic Panel: Recent Labs  Lab 10/21/22 1833 10/21/22 1926 10/21/22 2123 10/21/22 2129 10/22/22 0044 10/22/22 0418 10/22/22 0454 10/22/22 1614 10/22/22 2216 10/23/22 0431  NA 142   < >  --    < > 142 142 141  --  142 139  K 4.2   < >  --    < > 4.3 4.4 4.3  --  4.6 3.8  CL 107  --   --   --   --   --  107  --  105 108  CO2 18*  --   --   --   --   --  22  --  23 24  GLUCOSE 129*  --   --   --   --   --  87  --  107* 113*  BUN 26*  --   --   --   --   --  24*  --  22* 18  CREATININE 1.41*  --  1.28*  --   --   --  1.05  --  0.88 0.76  CALCIUM 8.9  --   --   --   --   --  8.4*  --  8.6* 8.2*  MG  --   --  1.7  --   --   --  1.7 1.6* 1.6* 1.7  PHOS  --   --  3.5  --   --   --  4.7* 3.8  --  3.1   < > = values in this interval not displayed.   GFR: Estimated Creatinine Clearance: 90.5 mL/min (by C-G formula based on SCr of 0.76 mg/dL). Recent Labs  Lab 10/21/22 1827 10/21/22 1833 10/21/22 2123 10/21/22 2148 10/22/22 0454 10/22/22 0741 10/23/22 0431  WBC  --  13.6* 10.9*  --  14.7*  --  11.2*  LATICACIDVEN 4.8*  --   --  3.0*  --  1.7  --     Liver Function Tests: Recent Labs  Lab 10/21/22 1833 10/23/22 0431  AST 19 11*  ALT 7 6  ALKPHOS 56 40  BILITOT 0.4 0.3  PROT 7.7 5.3*  ALBUMIN 2.5* 1.6*   No results for input(s): "LIPASE", "AMYLASE" in the last 168 hours. No results for input(s): "AMMONIA" in the last 168 hours.  ABG    Component Value Date/Time   PHART 7.36 10/23/2022 0535   PCO2ART 47 10/23/2022 0535   PO2ART 91 10/23/2022 0535   HCO3 26.6 10/23/2022 0535   TCO2 28 10/22/2022 0418   ACIDBASEDEF 4.0 (H) 10/22/2022 0044   O2SAT 100 10/23/2022 0535     Coagulation Profile: Recent Labs  Lab 10/21/22 1833  INR 1.1    Cardiac Enzymes: No results for input(s): "CKTOTAL", "CKMB", "CKMBINDEX", "TROPONINI" in the last 168  hours.  HbA1C: Hgb A1c MFr Bld  Date/Time Value Ref Range Status  11/24/2011 05:15 AM 4.6 <5.7 % Final    Comment:    (NOTE)                                                                       According to the ADA Clinical Practice Recommendations for 2011, when HbA1c is used as a screening test:  >=6.5%   Diagnostic of Diabetes Mellitus           (if abnormal result is confirmed) 5.7-6.4%   Increased risk of developing Diabetes Mellitus References:Diagnosis and Classification of Diabetes Mellitus,Diabetes Care,2011,34(Suppl 1):S62-S69 and Standards of Medical Care in         Diabetes - 2011,Diabetes Care,2011,34 (Suppl 1):S11-S61.  10/05/2011 05:10 AM 5.0 <5.7 % Final    Comment:    (NOTE)  According to the ADA Clinical Practice Recommendations for 2011, when HbA1c is used as a screening test:  >=6.5%   Diagnostic of Diabetes Mellitus           (if abnormal result is confirmed) 5.7-6.4%   Increased risk of developing Diabetes Mellitus References:Diagnosis and Classification of Diabetes Mellitus,Diabetes Care,2011,34(Suppl 1):S62-S69 and Standards of Medical Care in         Diabetes - 2011,Diabetes Care,2011,34 (Suppl 1):S11-S61.    CBG: Recent Labs  Lab 10/22/22 1948 10/22/22 2334 10/23/22 0347 10/23/22 0754 10/23/22 1117  GLUCAP 101* 116* 105* 110* 124*    Review of Systems:   As in HPI  Past Medical History:  He,  has a past medical history of Addison disease (HCC), Anoxic brain injury (HCC), Anxiety, Aspiration pneumonia (HCC), Atrial fibrillation (HCC), Chronic constipation (11/23/2011), Dementia (HCC), Dysphagia, Encephalopathy, Frontal lobe syndrome, Gait abnormality, Glaucoma, Glucocorticoid deficiency (HCC), History of heart attack, History of recurrent UTIs, Hyperlipemia, Hypothyroidism, Mental disorder, Myocardial infarction (HCC), Pneumoperitoneum of unknown etiology (12/21/2011),  Quadriplegia (HCC), Reflux, Seizure disorder (HCC), and Weakness of both legs.   Surgical History:   Past Surgical History:  Procedure Laterality Date   DEBRIDEMENT OF ABDOMINAL WALL ABSCESS  01/26/2018   HERNIA MESH REMOVAL  01/26/2018   Infected mesh - removed w I&D abd wall abscess   IR GENERIC HISTORICAL  10/20/2016   IR REPLC GASTRO/COLONIC TUBE PERCUT W/FLUORO 10/20/2016 Oley Balm, MD [facility]   IR REPLACE G-TUBE SIMPLE WO FLUORO  01/28/2018   IR REPLC GASTRO/COLONIC TUBE PERCUT W/FLUORO  04/21/2017   IR REPLC GASTRO/COLONIC TUBE PERCUT W/FLUORO  07/22/2017   IR REPLC GASTRO/COLONIC TUBE PERCUT W/FLUORO  11/24/2017   IR REPLC GASTRO/COLONIC TUBE PERCUT W/FLUORO  08/12/2020   IRRIGATION AND DEBRIDEMENT ABSCESS N/A 01/26/2018   Procedure: IRRIGATION AND DEBRIDEMENT ABDOMINAL WALL ABSCESS WITH REMOVAL OF INFECTED MESH;  Surgeon: Karie Soda, MD;  Location: [facility];  Service: General;  Laterality: N/A;   NEPHRECTOMY  unknown   PEG TUBE PLACEMENT       Social History:   reports that he has never smoked. He has never used smokeless tobacco. He reports that he does not drink alcohol and does not use drugs.   Family History:  His family history is not on file.   Allergies Allergies  Allergen Reactions   Ativan [Lorazepam] Other (See Comments)    Unknown (not listed on MAR)   Latex Dermatitis and Other (See Comments)    "Allergic," per MAR   Morphine And Related Other (See Comments)    Reaction not listed on MAR   Other Other (See Comments)    Soy Sauce  Reaction not listed on MAR   Penicillins Other (See Comments)    Patient tolerated cephalosporins in past. "Allergic," per MAR.  Has patient had a PCN reaction causing immediate rash, facial/tongue/throat swelling, SOB or lightheadedness with hypotension: unknown Has patient had a PCN reaction causing severe rash involving mucus membranes or skin necrosis: unknown Has patient had a PCN reaction that required  hospitalization: no Has patient had a PCN reaction occurring within the last 10 years: yes If all of the above answe   Pyridium [Phenazopyridine Hcl] Other (See Comments)    Reaction not listed on MAR   Soy Allergy Other (See Comments)    Reaction not listed on MAR   Soybean (Diagnostic) Other (See Comments)    Reaction not listed no MAR     Home Medications  Prior to Admission medications  Medication Sig Start Date End Date Taking? Authorizing Provider  acetaminophen (TYLENOL) 325 MG tablet Take 650 mg by mouth every 4 (four) hours as needed (pain).   Yes [provider]  albuterol (ACCUNEB) 0.63 MG/3ML nebulizer solution Take 1 ampule by nebulization every 6 (six) hours as needed for wheezing or shortness of breath (COPD).   Yes [provider]  aspirin 81 MG chewable tablet Chew 81 mg by mouth every morning. 0900   Yes [provider]  calcium carbonate (TUMS - DOSED IN MG ELEMENTAL CALCIUM) 500 MG chewable tablet Chew 1 tablet by mouth every 12 (twelve) hours as needed for indigestion or heartburn.   Yes [provider]  Carboxymeth-Glycerin-Polysorb (REFRESH OPTIVE ADVANCED) 0.5-1-0.5 % SOLN Place 1 drop into both eyes 3 (three) times daily. 0900, 1400, 2100   Yes [provider]  clonazePAM (KLONOPIN) 0.5 MG tablet Take 0.5 mg by mouth 2 (two) times daily. 0830 and 1630   Yes [provider]  divalproex (DEPAKOTE SPRINKLE) 125 MG capsule Take 500 mg by mouth 3 (three) times daily. 0830, 1230, and 2030 07/18/13  Yes [provider]  docusate sodium (COLACE) 100 MG capsule Take 100 mg by mouth every morning. 0800   Yes [provider]  haloperidol (HALDOL) 5 MG tablet Take 5 mg by mouth 2 (two) times daily. 0830 and 2030   Yes [provider]  levothyroxine (SYNTHROID) 200 MCG tablet Take 200 mcg by mouth daily before breakfast. Take with a 25 mcg tablet for a total dose of 225 mcg every morning 0630   Yes  [provider]  levothyroxine (SYNTHROID) 25 MCG tablet Take 25 mcg by mouth daily before breakfast. Give 25mcg with current 200 mcg dose for 225 mcg 0630   Yes [provider]  Magnesium Hydroxide (MILK OF MAGNESIA PO) Take 30 mLs by mouth daily as needed (constipation).   Yes [provider]  omeprazole (PRILOSEC) 20 MG capsule Take 20 mg by mouth at bedtime.   Yes [provider]  polyethylene glycol (MIRALAX / GLYCOLAX) packet Take 17 g by mouth 2 (two) times daily. 0900 and 2100   Yes [provider]  senna (SENOKOT) 8.6 MG tablet Take 2 tablets by mouth 2 (two) times daily. 0900 and 2000   Yes [provider]  sertraline (ZOLOFT) 50 MG tablet Take 75 mg by mouth every morning. 0900   Yes [provider]  cephALEXin (KEFLEX) 500 MG capsule Take 1 capsule (500 mg total) by mouth 4 (four) times daily. Patient not taking: Reported on 05/24/2022 05/15/22   Mancel BaleWentz, Elliott, MD  fosfomycin (MONUROL) 3 g PACK Take 3 g by mouth See admin instructions. Give 3 g packet by mouth one time a day for UTI for one day  0900    [provider]  pantoprazole (PROTONIX) 40 MG tablet Take 40 mg by mouth daily. 0700    [provider]  pantoprazole sodium (PROTONIX) 40 mg/20 mL PACK Take 40 mg by mouth every morning. Patient not taking: Reported on 05/24/2022 11/30/11   Alinda Moneyavis, Novlet J, MD  traMADol (ULTRAM) 50 MG tablet Take 50 mg by mouth every 6 (six) hours as needed (pain).    [provider]     Critical care time: 6532

## 2022-10-23 NOTE — Progress Notes (Signed)
ANTICOAGULATION CONSULT NOTE  Pharmacy Consult for heparin Indication: atrial fibrillation  Heparin Dosing Weight: 56.7 kg  Labs: Recent Labs    10/21/22 1833 10/21/22 1926 10/21/22 2123 10/21/22 2129 10/22/22 0418 10/22/22 0454 10/22/22 2216 10/23/22 0431 10/23/22 1146 10/23/22 2052  HGB 14.3   < > 11.5*   < > 11.6* 11.2*  --  9.8*  --   --   HCT 45.1   < > 37.2*   < > 34.0* 35.1*  --  30.4*  --   --   PLT 301  --  161  --   --  240  --  186  --   --   APTT 43*  --   --   --   --   --   --   --   --   --   LABPROT 13.6  --   --   --   --   --   --   --   --   --   INR 1.1  --   --   --   --   --   --   --   --   --   HEPARINUNFRC  --   --   --   --   --   --   --  <0.10* <0.10* <0.10*  CREATININE 1.41*  --  1.28*  --   --  1.05 0.88 0.76  --   --    < > = values in this interval not displayed.     Assessment: 3 yom with hx of anoxic brain injury, afib not on anticoagulation PTA presenting from SNF with septic shock secondary to PNA, now if afib with RVR. Pharmacy consulted to start heparin infusion.   PM update - heparin level remains undetectable after several rate increases. Hg down a bit to 9.8 today, plt WNL. SCr improved. No bleeding or issues with infusion per discussion with RN.  Goal of Therapy:  Heparin level 0.3-0.7 units/ml Monitor platelets by anticoagulation protocol: Yes   Plan:  Heparin 1500 bolus x 1 Increase heparin to 1450 units/hr  Check 6hr heparin level Monitor daily CBC, s/sx bleeding   Arturo Morton, PharmD, BCPS Clinical Pharmacist 10/23/2022 10:19 PM

## 2022-10-23 NOTE — Progress Notes (Signed)
ANTICOAGULATION CONSULT NOTE - Follow Up Consult  Pharmacy Consult for heparin Indication: atrial fibrillation  Labs: Recent Labs    10/21/22 1833 10/21/22 1926 10/21/22 2123 10/21/22 2129 10/21/22 2148 10/22/22 0044 10/22/22 0418 10/22/22 0454 10/22/22 2216 10/23/22 0431  HGB 14.3   < > 11.5*   < >  --    < > 11.6* 11.2*  --  9.8*  HCT 45.1   < > 37.2*   < >  --    < > 34.0* 35.1*  --  30.4*  PLT 301  --  161  --   --   --   --  240  --  186  APTT 43*  --   --   --   --   --   --   --   --   --   LABPROT 13.6  --   --   --   --   --   --   --   --   --   INR 1.1  --   --   --   --   --   --   --   --   --   HEPARINUNFRC  --   --   --   --   --   --   --   --   --  <0.10*  CREATININE 1.41*  --  1.28*  --   --   --   --  1.05 0.88 0.76  TROPONINIHS 11  --   --   --  16  --   --   --   --   --    < > = values in this interval not displayed.    Assessment: 56yo male subtherapeutic on heparin with initial dosing for Afib; no infusion issues or signs of bleeding per RN.  Goal of Therapy:  Heparin level 0.3-0.7 units/ml   Plan:  Will rebolus with heparin 2000 units and increase heparin infusion by 4 units/kg/hr to 1000 units/hr and check level in 6 hours.    Wynona Neat, PharmD, BCPS  10/23/2022,5:17 AM

## 2022-10-23 NOTE — Progress Notes (Incomplete)
ANTICOAGULATION CONSULT NOTE - Follow Up Consult  Pharmacy Consult for heparin Indication: atrial fibrillation  Labs: Recent Labs    10/21/22 1833 10/21/22 1926 10/21/22 2123 10/21/22 2129 10/21/22 2148 10/22/22 0044 10/22/22 0418 10/22/22 0454 10/22/22 2216 10/23/22 0431 10/23/22 1146  HGB 14.3   < > 11.5*   < >  --    < > 11.6* 11.2*  --  9.8*  --   HCT 45.1   < > 37.2*   < >  --    < > 34.0* 35.1*  --  30.4*  --   PLT 301  --  161  --   --   --   --  240  --  186  --   APTT 43*  --   --   --   --   --   --   --   --   --   --   LABPROT 13.6  --   --   --   --   --   --   --   --   --   --   INR 1.1  --   --   --   --   --   --   --   --   --   --   HEPARINUNFRC  --   --   --   --   --   --   --   --   --  <0.10* <0.10*  CREATININE 1.41*  --  1.28*  --   --   --   --  1.05 0.88 0.76  --   TROPONINIHS 11  --   --   --  16  --   --   --   --   --   --    < > = values in this interval not displayed.     Assessment: 56yo male subtherapeutic on heparin with initial dosing for Afib; no infusion issues or signs of bleeding per RN.  Goal of Therapy:  Heparin level 0.3-0.7 units/ml   Plan:  Will rebolus with heparin 2000 units and increase heparin infusion by 4 units/kg/hr to 1000 units/hr and check level in 6 hours.    Sandford Craze, PharmD. [facility] Acute Care PGY-1  10/23/2022 12:41 PM

## 2022-10-24 ENCOUNTER — Inpatient Hospital Stay (HOSPITAL_COMMUNITY): Payer: 59

## 2022-10-24 DIAGNOSIS — E440 Moderate protein-calorie malnutrition: Secondary | ICD-10-CM | POA: Diagnosis not present

## 2022-10-24 DIAGNOSIS — G931 Anoxic brain damage, not elsewhere classified: Secondary | ICD-10-CM | POA: Diagnosis not present

## 2022-10-24 DIAGNOSIS — J9601 Acute respiratory failure with hypoxia: Secondary | ICD-10-CM | POA: Diagnosis not present

## 2022-10-24 DIAGNOSIS — J189 Pneumonia, unspecified organism: Secondary | ICD-10-CM | POA: Diagnosis not present

## 2022-10-24 LAB — CBC
HCT: 33.1 % — ABNORMAL LOW (ref 39.0–52.0)
Hemoglobin: 10.7 g/dL — ABNORMAL LOW (ref 13.0–17.0)
MCH: 28.7 pg (ref 26.0–34.0)
MCHC: 32.3 g/dL (ref 30.0–36.0)
MCV: 88.7 fL (ref 80.0–100.0)
Platelets: 232 10*3/uL (ref 150–400)
RBC: 3.73 MIL/uL — ABNORMAL LOW (ref 4.22–5.81)
RDW: 13.3 % (ref 11.5–15.5)
WBC: 11.7 10*3/uL — ABNORMAL HIGH (ref 4.0–10.5)
nRBC: 0 % (ref 0.0–0.2)

## 2022-10-24 LAB — CULTURE, RESPIRATORY W GRAM STAIN: Culture: NORMAL

## 2022-10-24 LAB — MAGNESIUM: Magnesium: 2.2 mg/dL (ref 1.7–2.4)

## 2022-10-24 LAB — HEPARIN LEVEL (UNFRACTIONATED)
Heparin Unfractionated: <0.1 IU/mL — ABNORMAL LOW (ref 0.30–0.70)
Heparin Unfractionated: <0.1 IU/mL — ABNORMAL LOW (ref 0.30–0.70)

## 2022-10-24 LAB — CORTISOL: Cortisol, Plasma: 11.5 ug/dL

## 2022-10-24 LAB — TSH: TSH: 5.527 u[IU]/mL — ABNORMAL HIGH (ref 0.350–4.500)

## 2022-10-24 LAB — GLUCOSE, CAPILLARY
Glucose-Capillary: 99 mg/dL (ref 70–99)
Glucose-Capillary: 96 mg/dL (ref 70–99)
Glucose-Capillary: 92 mg/dL (ref 70–99)
Glucose-Capillary: 115 mg/dL — ABNORMAL HIGH (ref 70–99)
Glucose-Capillary: 73 mg/dL (ref 70–99)
Glucose-Capillary: 71 mg/dL (ref 70–99)
Glucose-Capillary: 90 mg/dL (ref 70–99)

## 2022-10-24 MED ORDER — LORAZEPAM 2 MG/ML IJ SOLN
0.5000 mg | INTRAMUSCULAR | Status: DC | PRN
  Administered 2022-10-24: 0.5 mg via INTRAVENOUS
  Filled 2022-10-24: qty 1

## 2022-10-24 MED ORDER — LORAZEPAM 2 MG/ML IJ SOLN: 0.5000 mg | Freq: Once | INTRAMUSCULAR | Status: DC

## 2022-10-24 MED ORDER — VANCOMYCIN HCL IN DEXTROSE 1-5 GM/200ML-% IV SOLN
1000.0000 mg | Freq: Two times a day (BID) | INTRAVENOUS | Status: DC
  Administered 2022-10-24 – 2022-10-26 (×5): 1000 mg via INTRAVENOUS
  Filled 2022-10-24 (×7): qty 200

## 2022-10-24 MED ORDER — ENOXAPARIN SODIUM 60 MG/0.6ML IJ SOSY
60.0000 mg | PREFILLED_SYRINGE | Freq: Two times a day (BID) | INTRAMUSCULAR | Status: DC
  Filled 2022-10-24 (×2): qty 0.6

## 2022-10-24 MED ORDER — IOHEXOL 350 MG/ML SOLN
75.0000 mL | Freq: Once | INTRAVENOUS | Status: AC | PRN
  Administered 2022-10-24: 75 mL via INTRAVENOUS

## 2022-10-24 MED ORDER — SCOPOLAMINE 1 MG/3DAYS TD PT72
1.0000 | MEDICATED_PATCH | TRANSDERMAL | Status: DC
  Administered 2022-10-24 – 2022-10-27 (×2): 1.5 mg via TRANSDERMAL
  Filled 2022-10-24 (×3): qty 1

## 2022-10-24 MED ORDER — VANCOMYCIN HCL 1250 MG/250ML IV SOLN
1250.0000 mg | Freq: Once | INTRAVENOUS | Status: AC
  Administered 2022-10-24: 1250 mg via INTRAVENOUS
  Filled 2022-10-24: qty 250

## 2022-10-24 MED ORDER — ENOXAPARIN SODIUM 60 MG/0.6ML IJ SOSY
60.0000 mg | PREFILLED_SYRINGE | Freq: Two times a day (BID) | INTRAMUSCULAR | Status: DC
  Administered 2022-10-25 – 2022-10-27 (×4): 60 mg via SUBCUTANEOUS
  Filled 2022-10-24 (×5): qty 0.6

## 2022-10-24 MED ORDER — LORAZEPAM 2 MG/ML IJ SOLN: 0.5000 mg | INTRAMUSCULAR | Status: DC | PRN

## 2022-10-24 MED ORDER — APIXABAN 5 MG PO TABS
5.0000 mg | ORAL_TABLET | Freq: Two times a day (BID) | ORAL | Status: DC
  Filled 2022-10-24: qty 1

## 2022-10-24 MED ORDER — FLUDROCORTISONE ACETATE 0.1 MG PO TABS
0.1000 mg | ORAL_TABLET | Freq: Every day | ORAL | Status: DC
  Administered 2022-10-25: 0.1 mg
  Filled 2022-10-24 (×2): qty 1

## 2022-10-24 MED ORDER — HEPARIN BOLUS VIA INFUSION
1600.0000 [IU] | Freq: Once | INTRAVENOUS | Status: AC
  Administered 2022-10-24: 1600 [IU] via INTRAVENOUS
  Filled 2022-10-24: qty 1600

## 2022-10-24 MED ORDER — ENOXAPARIN SODIUM 60 MG/0.6ML IJ SOSY
60.0000 mg | PREFILLED_SYRINGE | Freq: Once | INTRAMUSCULAR | Status: AC
  Administered 2022-10-24: 60 mg via SUBCUTANEOUS
  Filled 2022-10-24: qty 0.6

## 2022-10-24 NOTE — Progress Notes (Signed)
PT Cancellation Note  Patient Details Name: [name] MRN: [id number] DOB: [birth year]   Cancelled Treatment:    Reason Eval/Treat Not Completed: Medical issues which prohibited therapy (Pt just extubated and not following commands per nurse. Will check back Monday per nurse advisement.)   Alvira Philips 10/24/2022, 11:21 AM Verlin Duke M,PT [place] [phone]

## 2022-10-24 NOTE — Progress Notes (Signed)
ANTICOAGULATION CONSULT NOTE  Pharmacy Consult for heparin Indication: atrial fibrillation  Heparin Dosing Weight: 56.7 kg  Labs: Recent Labs    10/21/22 1833 10/21/22 1926 10/22/22 0454 10/22/22 0454 10/22/22 2216 10/23/22 0431 10/23/22 1146 10/23/22 2052 10/24/22 0600  HGB 14.3   < > 11.2*  --   --  9.8*  --   --  10.7*  HCT 45.1   < > 35.1*  --   --  30.4*  --   --  33.1*  PLT 301   < > 240  --   --  186  --   --  232  APTT 43*  --   --   --   --   --   --   --   --   LABPROT 13.6  --   --   --   --   --   --   --   --   INR 1.1  --   --   --   --   --   --   --   --   HEPARINUNFRC  --   --   --    < >  --  <0.10* <0.10* <0.10* <0.10*  CREATININE 1.41*   < > 1.05  --  0.88 0.76  --   --   --    < > = values in this interval not displayed.     Assessment: 11 yom with hx of anoxic brain injury, afib not on anticoagulation PTA presenting from SNF with septic shock secondary to PNA, now if afib with RVR. Pharmacy consulted to start heparin infusion.   Heparin level on 1450 units/hr remains undetectable. Verified with RN - no concerns with IV line. CBC is stable.  Goal of Therapy:  Heparin level 0.3-0.7 units/ml Monitor platelets by anticoagulation protocol: Yes   Plan:  Heparin 1600 bolus IV x 1 Increase heparin to 1700 units/hr  Check 6hr heparin level Monitor daily CBC, s/sx bleeding  Erskine Speed, PharmD Clinical Pharmacist 10/24/2022 8:21 AM

## 2022-10-24 NOTE — Progress Notes (Signed)
Pharmacy Antibiotic Note  [name] is a 56 y.o. male admitted on 10/21/2022 with respiratory failure.  Pharmacy has been consulted for Vancomycin dosing.  CrCl currently 90 mL/min - no new labs today. Preliminary respiratory gram stain with GPR. Abx escalated with addition of vancomycin.   Plan: Vancomycin 1250mg  IV once Vancomycin 1000mg  IV Q12H (eAUC 495, Scr 0.76) F/u cultures for de-escalation and length of therapy Monitor renal function for dose adjustments  Height: 5\' 8"  (172.7 cm) Weight: 61.3 kg (135 lb 2.3 oz) IBW/kg (Calculated) : 68.4  Temp (24hrs), Avg:99.9 F (37.7 C), Min:98 F (36.7 C), Max:101.3 F (38.5 C)  Recent Labs  Lab 10/21/22 1827 10/21/22 1833 10/21/22 2123 10/21/22 2148 10/22/22 0454 10/22/22 0741 10/22/22 2216 10/23/22 0431 10/24/22 0600  WBC  --  13.6* 10.9*  --  14.7*  --   --  11.2* 11.7*  CREATININE  --  1.41* 1.28*  --  1.05  --  0.88 0.76  --   LATICACIDVEN 4.8*  --   --  3.0*  --  1.7  --   --   --     Estimated Creatinine Clearance: 90.5 mL/min (by C-G formula based on SCr of 0.76 mg/dL).    Allergies  Allergen Reactions   Ativan [Lorazepam] Other (See Comments)    Unknown (not listed on MAR)   Latex Dermatitis and Other (See Comments)    "Allergic," per MAR   Morphine And Related Other (See Comments)    Reaction not listed on MAR   Other Other (See Comments)    Soy Sauce  Reaction not listed on MAR   Penicillins Other (See Comments)    Patient tolerated cephalosporins in past. "Allergic," per MAR.  Has patient had a PCN reaction causing immediate rash, facial/tongue/throat swelling, SOB or lightheadedness with hypotension: unknown Has patient had a PCN reaction causing severe rash involving mucus membranes or skin necrosis: unknown Has patient had a PCN reaction that required hospitalization: no Has patient had a PCN reaction occurring within the last 10 years: yes If all of the above answe   Pyridium [Phenazopyridine  Hcl] Other (See Comments)    Reaction not listed on MAR   Soy Allergy Other (See Comments)    Reaction not listed on MAR   Soybean (Diagnostic) Other (See Comments)    Reaction not listed no MAR    Thank you for allowing pharmacy to be a part of this patient's care.  Merrilee Jansky, PharmD Clinical Pharmacist 10/24/2022 11:34 AM

## 2022-10-24 NOTE — Procedures (Signed)
Extubation Procedure Note  Patient Details:   Name: [name] DOB: June 01, 1966 MRN: [id number]   Airway Documentation:    Vent end date: 10/24/22 Vent end time: 0910   Evaluation  O2 sats: stable throughout Complications: No apparent complications Patient did tolerate procedure well. Bilateral Breath Sounds: Clear, Diminished   No  Pt extubated per MD order, placed on 3L [place]. Positive cuff leak noted, no stridor heard. RT will cont. To monitor.   Tobi Bastos 10/24/2022, 9:15 AM

## 2022-10-24 NOTE — Progress Notes (Addendum)
[place] for heparin>>Lovenox Indication: atrial fibrillation  Heparin Dosing Weight: 56.7 kg  Labs: Recent Labs    10/21/22 1833 10/21/22 1926 10/22/22 0454 10/22/22 2216 10/23/22 0431 10/23/22 1146 10/23/22 2052 10/24/22 0600 10/24/22 1503  HGB 14.3   < > 11.2*  --  9.8*  --   --  10.7*  --   HCT 45.1   < > 35.1*  --  30.4*  --   --  33.1*  --   PLT 301   < > 240  --  186  --   --  232  --   APTT 43*  --   --   --   --   --   --   --   --   LABPROT 13.6  --   --   --   --   --   --   --   --   INR 1.1  --   --   --   --   --   --   --   --   HEPARINUNFRC  --   --   --   --  <0.10*   < > <0.10* <0.10* <0.10*  CREATININE 1.41*   < > 1.05 0.88 0.76  --   --   --   --    < > = values in this interval not displayed.     Assessment: 3 yom with hx of anoxic brain injury, afib not on anticoagulation PTA presenting from SNF with septic shock secondary to PNA, now if afib with RVR. Pharmacy consulted to start heparin infusion.   Heparin level on 1450 units/hr remains undetectable. Verified with RN - no concerns with IV line. CBC is stable.  Addendum  HL came back undectable again. Ok to change to apixaban per Dr. Coy Saunas. However, pt can't swallow right now. Ok to change to Lovenox instead.   Goal of Therapy:  Monitor platelets by anticoagulation protocol: Yes   Plan:  Dc heparin Lovenox 60mg  SQ BID  Onnie Boer, PharmD, [place], AAHIVP, CPP Infectious Disease Pharmacist 10/24/2022 4:25 PM

## 2022-10-24 NOTE — Progress Notes (Signed)
Pt has small cut on his lip due from bite block that was placed over night. MD and RN aware at this time. RT will continue to monitor.

## 2022-10-24 NOTE — Progress Notes (Signed)
[place] Progress Note Patient Name: [name] DOB: 08-16-1966 MRN: [id number]   Date of Service  10/24/2022  HPI/Events of Note  Pt HR brady 50's on Amio drip @ 30. Having to increase Precedex for agitation, now at 0.4  56 yo M from [place] facility due to anoxic brain injury after MI who came to [facility] for pneumonia/ARF.  He was intubated for this 10/25 an extubated today Abdominal wall abscess, seen by Surgeon, re assesing in AM for drainage.  A fib  Data: CT abd reviewed Hg 10.7 K 3.8, mag 2.2 from AM labs Cr normal.  eICU Interventions  Will hold amiodarone gtt for now. Re start if gets RVR, on lovenox as AC.      Intervention Category Intermediate Interventions: Arrhythmia - evaluation and management  Elmer Sow 10/24/2022, 9:04 PM  01:17 am Pt c/o generalized pain and ony has Fent "bolus from bag" on MAR from when he was on drip.  Discussed with RN - tylenol IV once for pain- generalized. Extubated yesterday only.   2AM Camera: Loletha Grayer down < 40, asymptomatic. Now at 71, sinus brady. MAP good. Precedex held. RN asking for sedation /anti anxiety meds. He gets agitation uses foul language. Restless. Do not now whether drink alcohol or not. Off of amiodarone from early in the shift. Protecting airways. Encephalopathy better, per RN.  - stop precedex. - Ativan 1 mg prn ordered ( non specific ativan allergy, but tolerated for scan on this admission).  - aspiration precautions - watch for lethargy or hypoxemia, if so need ABG. Low thresh hold for re intubation if worsening respiratory status or encephalopathy.

## 2022-10-24 NOTE — Progress Notes (Signed)
NAME:  [name], MRN:  [id number], DOB:  09-04-1966, LOS: 3 ADMISSION DATE:  10/21/2022, CONSULTATION DATE:  10/25 REFERRING MD:  Theresia Lo, EDP CHIEF COMPLAINT:  Hypoxia   History of Present Illness:  56 year old man admitted from [facility] for hypoxia and unresponsiveness. Limited information available. May have aspirated. Hypoxic to 70's and agitated in ED so intubated.    Chronic SNF resident due to demential and anoxic brain injury, gait difficulties. May be bed bound.    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  He eventually recovered but had subsequent anoxic brain injury.  He has been institution bound since approximately 2007.  His sister states that he has had a significant functional decline in the past 2 years since their mother passed away.  Pertinent  Medical History   Past Medical History:  Diagnosis Date   Addison disease (HCC)    Anoxic brain injury (HCC)    TBI   Anxiety    Aspiration pneumonia (HCC)    Atrial fibrillation (HCC)    Chronic constipation 11/23/2011   Dementia (HCC)    Dysphagia    Encephalopathy    Frontal lobe syndrome    Gait abnormality    freq falls   Glaucoma    Glucocorticoid deficiency (HCC)    History of heart attack    History of recurrent UTIs    Hyperlipemia    Hypothyroidism    Mental disorder    Myocardial infarction (HCC)    Pneumoperitoneum of unknown etiology 12/21/2011   Quadriplegia (HCC)    Reflux    Seizure disorder (HCC)    Weakness of both legs      Significant Hospital Events: Including procedures, antibiotic start and stop dates in addition to other pertinent events   10/25 - admitted via ED. 10/27: A-fib overnight,  started amiodarone drip 10/28 abdominal U/S 6.7 x 6.8 cm hernia versus hematoma   Interim History / Subjective:  Patient on pressure support this morning Continues to be agitated, off of propofol and fentanyl ET tube removed this morning  Objective   Blood pressure 108/72, pulse (!) 54, temperature 98 F (36.7 C), temperature source Oral, resp. rate (!) 28, height 5\' 8"  (1.727 m), weight 61.3 kg, SpO2 100 %.    Vent Mode: PRVC FiO2 (%):  [30 %] 30 % Set Rate:  [28 bmp] 28 bmp Vt Set:  [410 mL] 410 mL PEEP:  [5 cmH20-8 cmH20] 5 cmH20 Pressure Support:  [5 cmH20-8 cmH20] 8 cmH20 Plateau Pressure:  [11 cmH20-16 cmH20] 11 cmH20   Intake/Output Summary (Last 24 hours) at 10/24/2022 0740 Last data filed at 10/24/2022 0730 Gross per 24 hour  Intake 5254.6 ml  Output 875 ml  Net 4379.6 ml   Filed Weights   10/22/22 0026 10/22/22 0500 10/23/22 0500  Weight: 56.7 kg 56.7 kg 61.3 kg   Examination: General: Acutely ill and cachectic middle-age man. Remains fidgety. HENT: Dry mucous membrane Lungs: Rhonchorous anterior lung sounds.  No WOB. Cardiovascular: RRR. No murmurs, rubs or gallops.   Abdomen: Soft.  NT/ND. Abdominal wall hernia mildly warm to touch. Mild purulent from umbilicus. Extremities: Warm and dry. Neuro: Awake and alert.  Agitated and  fidgety.  Grinding teeth. GU: Foley in place.  TSH 5.5, cortisol 11.5, WBC 11.7, Hgb 10.7, platelet 232 Wound culture Gram stain showing gram-positive cocci  Resolved Hospital Problem list   AKI Respiratory acidosis  Assessment & Plan:  Septic shock 2/2 pneumonia HCAP Continues to improve clinically. Still requiring low-dose Levophed.  Leukocytosis stable. Remains afebrile.  Respiratory culture showing abundant gram-positive rods.  And rare yeast with pseudohyphae. We will start fludrocortisone to help wean off levophed -Continue ceftriaxone -Start vancomycin -Continue IV LR -Wean Levophed for MAP goal >65  Acute respiratory  failure with hypoxia (HCC) Respiratory acidosis, resolved Extubated to 3 LNC.  Continues to have increased secretions. -Continue O2 supplementation, wean for O2 sat >92% -Scopolamine patch to help with secretions  Hx of abdominal hernia Hx abdominal wall abscess Patient has a history of abdominal hernia status post placement of mesh.  He was found to have abdominal wall abscess and infected mesh which was removed back in 2019. Previous peg site wound culture Gram stain showing gram-positive cocci -CT abdomen pelvis to further evaluate -Start vancomycin   New onset A-fib Patient has remain in normal sinus in the last 48 hours.  No electrolyte abnormalities.  TSH within normal limits. -Continue IV heparin -Continue IV amiodarone for now -Telemetry -Replete electrolytes   Cardiomyopathy, ischemic Last TTE 09/2017 showed EF 60 to 65% and akinesis of the basal inferior myocardium. Repeat TTE showed EF 50 to 55%, with global hypokinesis but no valvular abnormalities. -Monitor and replete electrolytes -Strict I&O's  Hypothyroidism Repeat stable at TSH 5.5. -Continue Synthroid to 225 mcg daily  Hx adrenal insufficiency Normal a.m. cortisol. -Fludrocortisone 0.1 mg daily to help with BP  Protein calorie malnutrition (HCC) -Continue tube feeds  Best Practice (right click and "Reselect all SmartList Selections" daily)   Diet/type: tubefeeds DVT prophylaxis: LMWH GI prophylaxis: PPI Lines: N/A Foley:  Yes, and it is still needed Code Status:  limited Last date of multidisciplinary goals of care discussion 10/28 [updated sister on the phone, who plans to visit patient tomorrow, Sunday.  Reports that patient was always fidgety and grinding his teeth while at the facility.  He was placed on a mat due to multiple falls from bed]  Labs   CBC: Recent Labs  Lab 10/21/22 1833 10/21/22 1926 10/21/22 2123 10/21/22 2129 10/22/22 0044 10/22/22 0418 10/22/22 0454 10/23/22 0431  10/24/22 0600  WBC 13.6*  --  10.9*  --   --   --  14.7* 11.2* 11.7*  NEUTROABS 11.9*  --   --   --   --   --   --  8.3*  --   HGB 14.3   < > 11.5*   < > 11.9* 11.6* 11.2* 9.8* 10.7*  HCT 45.1   < > 37.2*   < > 35.0* 34.0* 35.1* 30.4* 33.1*  MCV 92.0  --  93.7  --   --   --  89.8 90.5 88.7  PLT 301  --  161  --   --   --  240 186 232   < > = values in this interval not displayed.    Basic Metabolic Panel: Recent Labs  Lab 10/21/22 1833 10/21/22 1926 10/21/22 2123 10/21/22 2129 10/22/22 0044 10/22/22 0418 10/22/22 0454 10/22/22 1614 10/22/22 2216 10/23/22 0431  NA 142   < >  --    < > 142 142 141  --  142 139  K 4.2   < >  --    < >  4.3 4.4 4.3  --  4.6 3.8  CL 107  --   --   --   --   --  107  --  105 108  CO2 18*  --   --   --   --   --  22  --  23 24  GLUCOSE 129*  --   --   --   --   --  87  --  107* 113*  BUN 26*  --   --   --   --   --  24*  --  22* 18  CREATININE 1.41*  --  1.28*  --   --   --  1.05  --  0.88 0.76  CALCIUM 8.9  --   --   --   --   --  8.4*  --  8.6* 8.2*  MG  --   --  1.7  --   --   --  1.7 1.6* 1.6* 1.7  PHOS  --   --  3.5  --   --   --  4.7* 3.8  --  3.1   < > = values in this interval not displayed.   GFR: Estimated Creatinine Clearance: 90.5 mL/min (by C-G formula based on SCr of 0.76 mg/dL). Recent Labs  Lab 10/21/22 1827 10/21/22 1833 10/21/22 2123 10/21/22 2148 10/22/22 0454 10/22/22 0741 10/23/22 0431 10/24/22 0600  WBC  --    < > 10.9*  --  14.7*  --  11.2* 11.7*  LATICACIDVEN 4.8*  --   --  3.0*  --  1.7  --   --    < > = values in this interval not displayed.    Liver Function Tests: Recent Labs  Lab 10/21/22 1833 10/23/22 0431  AST 19 11*  ALT 7 6  ALKPHOS 56 40  BILITOT 0.4 0.3  PROT 7.7 5.3*  ALBUMIN 2.5* 1.6*   No results for input(s): "LIPASE", "AMYLASE" in the last 168 hours. No results for input(s): "AMMONIA" in the last 168 hours.  ABG    Component Value Date/Time   PHART 7.36 10/23/2022 0535   PCO2ART  47 10/23/2022 0535   PO2ART 91 10/23/2022 0535   HCO3 26.6 10/23/2022 0535   TCO2 28 10/22/2022 0418   ACIDBASEDEF 4.0 (H) 10/22/2022 0044   O2SAT 100 10/23/2022 0535     Coagulation Profile: Recent Labs  Lab 10/21/22 1833  INR 1.1    Cardiac Enzymes: No results for input(s): "CKTOTAL", "CKMB", "CKMBINDEX", "TROPONINI" in the last 168 hours.  HbA1C: Hgb A1c MFr Bld  Date/Time Value Ref Range Status  11/24/2011 05:15 AM 4.6 <5.7 % Final    Comment:    (NOTE)                                                                       According to the ADA Clinical Practice Recommendations for 2011, when HbA1c is used as a screening test:  >=6.5%   Diagnostic of Diabetes Mellitus           (if abnormal result is confirmed) 5.7-6.4%   Increased risk of developing Diabetes Mellitus References:Diagnosis and Classification of Diabetes Mellitus,Diabetes Care,2011,34(Suppl 1):S62-S69 and Standards of Medical Care  in         Diabetes - 2011,Diabetes Care,2011,34 (Suppl 1):S11-S61.  10/05/2011 05:10 AM 5.0 <5.7 % Final    Comment:    (NOTE)                                                                       According to the ADA Clinical Practice Recommendations for 2011, when HbA1c is used as a screening test:  >=6.5%   Diagnostic of Diabetes Mellitus           (if abnormal result is confirmed) 5.7-6.4%   Increased risk of developing Diabetes Mellitus References:Diagnosis and Classification of Diabetes Mellitus,Diabetes [facility],9470,96(GEZMO 1):S62-S69 and Standards of Medical Care in         Diabetes - 2011,Diabetes QHUT,6546,50 (Suppl 1):S11-S61.    CBG: Recent Labs  Lab 10/23/22 1532 10/23/22 1947 10/23/22 2315 10/24/22 0317 10/24/22 0643  GLUCAP 115* 123* 94 99 96    Review of Systems:   As in HPI  Past Medical History:  He,  has a past medical history of Addison disease ([place]), Anoxic brain injury ([place]), Anxiety, Aspiration pneumonia ([place]), Atrial fibrillation ([place]),  Chronic constipation (11/23/2011), Dementia ([place]), Dysphagia, Encephalopathy, Frontal lobe syndrome, Gait abnormality, Glaucoma, Glucocorticoid deficiency ([place]), History of heart attack, History of recurrent UTIs, Hyperlipemia, Hypothyroidism, Mental disorder, Myocardial infarction ([place]), Pneumoperitoneum of unknown etiology (12/21/2011), Quadriplegia ([place]), Reflux, Seizure disorder ([place]), and Weakness of both legs.   Surgical History:   Past Surgical History:  Procedure Laterality Date   DEBRIDEMENT OF ABDOMINAL WALL ABSCESS  01/26/2018   HERNIA MESH REMOVAL  01/26/2018   Infected mesh - removed w I&D abd wall abscess   IR GENERIC HISTORICAL  10/20/2016   IR [place] GASTRO/COLONIC TUBE PERCUT W/FLUORO 10/20/2016 Arne Cleveland, MD [facility]INTER[facility]   IR REPLACE G-TUBE SIMPLE WO FLUORO  01/28/2018   IR [place] GASTRO/COLONIC TUBE PERCUT W/FLUORO  04/21/2017   IR [place] GASTRO/COLONIC TUBE PERCUT W/FLUORO  07/22/2017   IR REPLC GASTRO/COLONIC TUBE PERCUT W/FLUORO  11/24/2017   IR REPLC GASTRO/COLONIC TUBE PERCUT W/FLUORO  08/12/2020   IRRIGATION AND DEBRIDEMENT ABSCESS N/A 01/26/2018   Procedure: IRRIGATION AND DEBRIDEMENT ABDOMINAL WALL ABSCESS WITH REMOVAL OF INFECTED MESH;  Surgeon: Michael Boston, MD;  Location: [facility];  Service: General;  Laterality: N/A;   NEPHRECTOMY  unknown   PEG TUBE PLACEMENT       Social History:   reports that he has never smoked. He has never used smokeless tobacco. He reports that he does not drink alcohol and does not use drugs.   Family History:  His family history is not on file.   Allergies Allergies  Allergen Reactions   Ativan [Lorazepam] Other (See Comments)    Unknown (not listed on MAR)   Latex Dermatitis and Other (See Comments)    "Allergic," per MAR   Morphine And Related Other (See Comments)    Reaction not listed on MAR   Other Other (See Comments)    Soy Sauce  Reaction not listed on MAR   Penicillins Other (See Comments)    Patient tolerated  cephalosporins in past. "Allergic," per MAR.  Has patient had a PCN reaction causing immediate rash, facial/tongue/throat swelling, SOB or lightheadedness with hypotension: unknown Has  patient had a PCN reaction causing severe rash involving mucus membranes or skin necrosis: unknown Has patient had a PCN reaction that required hospitalization: no Has patient had a PCN reaction occurring within the last 10 years: yes If all of the above answe   Pyridium [Phenazopyridine Hcl] Other (See Comments)    Reaction not listed on MAR   Soy Allergy Other (See Comments)    Reaction not listed on MAR   Soybean (Diagnostic) Other (See Comments)    Reaction not listed no MAR     Home Medications  Prior to Admission medications   Medication Sig Start Date End Date Taking? Authorizing Provider  acetaminophen (TYLENOL) 325 MG tablet Take 650 mg by mouth every 4 (four) hours as needed (pain).   Yes [provider]  albuterol (ACCUNEB) 0.63 MG/3ML nebulizer solution Take 1 ampule by nebulization every 6 (six) hours as needed for wheezing or shortness of breath (COPD).   Yes [provider]  aspirin 81 MG chewable tablet Chew 81 mg by mouth every morning. 0900   Yes [provider]  calcium carbonate (TUMS - DOSED IN MG ELEMENTAL CALCIUM) 500 MG chewable tablet Chew 1 tablet by mouth every 12 (twelve) hours as needed for indigestion or heartburn.   Yes [provider]  Carboxymeth-Glycerin-Polysorb (REFRESH OPTIVE ADVANCED) 0.5-1-0.5 % SOLN Place 1 drop into both eyes 3 (three) times daily. 0900, 1400, 2100   Yes [provider]  clonazePAM (KLONOPIN) 0.5 MG tablet Take 0.5 mg by mouth 2 (two) times daily. 0830 and 1630   Yes ProvideCont[facility] Centerr, [phone], MD  divalThe [facility] [phone] ILK OF MAGNESIA PO) Take 30 mLs by mouth daily as needed (constipation).   Yes [provider]  omeprazole (PRILOSEC) 20 MG capsule Take 20 mg by mouth at bedtime.   Yes [provider]  polyethylene glycol (MIRALAX / GLYCOLAX) packet Take 17 g by mouth 2 (two) times daily. 0900 and 2100   Yes [provider]  senna (SENOKOT) 8.6 MG tablet Take 2 tablets by mouth 2 (two) times daily. 0900 and 2000   Yes [provider]  sertraline (ZOLOFT) 50 MG tablet Take 75 mg by mouth every morning. 0900   Yes [provider]  cephALEXin (KEFLEX) 500 MG capsule Take 1 capsule (500 mg total) by mouth 4 (four) times daily. Patient not taking: Reported on 05/24/2022 05/15/22   Wentz, Elliott, MD  fosfomycin (MONUROL) 3 g PACK Take 3 g by mouth See admin instructions. Give 3 g packet by mouth one time a day for UTI for one day  0900    [provider]  pantoprazole (PROTONIX) 40 MG tablet Take 40 mg by mouth  daily. 0700    [provider]  pantoprazole sodium (PROTONIX) 40 mg/20 mL PACK Take 40 mg by mouth every morning. Patient not taking: Reported on 05/24/2022 11/30/11   Alinda Moneyavis, Novlet J, MD  traMADol (ULTRAM) 50 MG tablet Take 50 mg by mouth every 6 (six) hours as needed (pain).    [provider]     Critical care time: 6132

## 2022-10-24 NOTE — Consult Note (Signed)
Reason for Consult:abscess/hernia Referring Physician: Jumar Greenstreet is an 56 y.o. male.  HPI:  Pt is a 56 yo M from [facility] facility due to anoxic brain injury after MI who came [facility] for [facility]F.  He was intubated for this 10/25 an extubated today.  He developed some drainage from his right side today and it was noted today that his right abdominal wall was red and swollen.  He has h/o hernia.    He got scan which showed a large abscess on the right abdominal wall and a moderate to large right lumbar hernia that was not incarcerated.  Pt has h/o infected mesh with removal and irrigation/debridement 12/2017 by Dr. Johney Maine.    We are asked to evaluate.    Past Medical History:  Diagnosis Date   Addison disease (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[place])    History of heart attack    History of recurrent UTIs    Hyperlipemia    Hypothyroidism    Mental disorder    Myocardial infarction ([place])    Pneumoperitoneum of unknown etiology 12/21/2011   Quadriplegia ([place])    Reflux    Seizure disorder (HCC)    Weakness of both legs     Past Surgical History:  Procedure Laterality Date   DEBRIDEMENT OF ABDOMINAL WALL ABSCESS  01/26/2018   HERNIA MESH REMOVAL  01/26/2018   Infected mesh - removed w I&D abd wall abscess   IR GENERIC HISTORICAL  10/20/2016   IR REPLC GASTRO/COLONIC TUBE PERCUT W/FLUORO 10/20/2016 Arne Cleveland, MD [facility]INTER[facility]   IR REPLACE G-TUBE SIMPLE WO FLUORO  01/28/2018   IR [place] GASTRO/COLONIC TUBE PERCUT W/FLUORO  04/21/2017   IR [place] GASTRO/COLONIC TUBE PERCUT W/FLUORO  07/22/2017   IR [place] GASTRO/COLONIC TUBE PERCUT W/FLUORO  11/24/2017   IR REPLC GASTRO/COLONIC TUBE PERCUT W/FLUORO   08/12/2020   IRRIGATION AND DEBRIDEMENT ABSCESS N/A 01/26/2018   Procedure: IRRIGATION AND DEBRIDEMENT ABDOMINAL WALL ABSCESS WITH REMOVAL OF INFECTED MESH;  Surgeon: Michael Boston, MD;  Location: [facility];  Service: General;  Laterality: N/A;   NEPHRECTOMY  unknown   PEG TUBE PLACEMENT      No family history on file.  Social History:  reports that he has never smoked. He has never used smokeless tobacco. He reports that he does not drink alcohol and does not use drugs.  Allergies:  Allergies  Allergen Reactions   Ativan [Lorazepam] Other (See Comments)    Tolerated 09/2022 admission   Latex Dermatitis and Other (See Comments)    "Allergic," per MAR   Morphine And Related Other (See Comments)    Reaction not listed on MAR   Other Other (See Comments)    Soy Sauce  Reaction not listed on MAR   Penicillins Other (See Comments)    Patient tolerated cephalosporins in past. "Allergic," per MAR.  Has patient had a PCN reaction causing immediate rash, facial/tongue/throat swelling, SOB or lightheadedness with hypotension: unknown Has patient had a PCN reaction causing severe rash involving mucus membranes or skin necrosis: unknown Has patient had a PCN reaction that required hospitalization: no Has patient  had a PCN reaction occurring within the last 10 years: yes If all of the above answe   Pyridium [Phenazopyridine Hcl] Other (See Comments)    Reaction not listed on MAR   Soy Allergy Other (See Comments)    Reaction not listed on MAR   Soybean (Diagnostic) Other (See Comments)    Reaction not listed no MAR    Medications:  Current Meds  Medication Sig   acetaminophen (TYLENOL) 325 MG tablet Take 650 mg by mouth every 4 (four) hours as needed for mild pain (CANNOT EXCEED A SUM TOTAL OF 4,000 MG/DAY FROM ALL COMBINED SOURCES).   albuterol (ACCUNEB) 0.63 MG/3ML nebulizer solution Take 1 ampule by nebulization every 6 (six) hours as needed for wheezing or shortness of breath (COPD).    aspirin 81 MG chewable tablet Chew 81 mg by mouth every morning.   calcium carbonate (TUMS - DOSED IN MG ELEMENTAL CALCIUM) 500 MG chewable tablet Chew 1 tablet by mouth every 12 (twelve) hours as needed (FOR GERD).   clonazePAM (KLONOPIN) 0.5 MG tablet Take 0.5 mg by mouth 2 (two) times daily. 0830 and 1630   divalproex (DEPAKOTE SPRINKLE) 125 MG capsule Take 500 mg by mouth 3 (three) times daily. 0830, 1230, and 2030   docusate sodium (COLACE) 100 MG capsule Take 100 mg by mouth every morning.   haloperidol (HALDOL) 5 MG tablet Take 5 mg by mouth every evening.   Magnesium Hydroxide (MILK OF MAGNESIA PO) Take 30 mLs by mouth daily as needed (for constipation).   omeprazole (PRILOSEC) 20 MG capsule Take 20 mg by mouth at bedtime.   polyethylene glycol (MIRALAX / GLYCOLAX) packet Take 17 g by mouth 2 (two) times daily. 0900 and 2100   REFRESH OPTIVE ADVANCED PF 0.5-1-0.5 % SOLN Place 1 drop into both eyes 3 (three) times daily.   REMERON 15 MG tablet Take 15 mg by mouth See admin instructions. Take 15 mg by mouth at 4:30 PM daily for decreased appetite/weight loss   senna (SENOKOT) 8.6 MG tablet Take 2 tablets by mouth 2 (two) times daily. 0900 and 2000   sertraline (ZOLOFT) 50 MG tablet Take 75 mg by mouth every morning.   SYNTHROID 200 MCG tablet Take 200 mcg by mouth daily before breakfast.   SYNTHROID 25 MCG tablet Take 25 mcg by mouth daily before breakfast.     Results for orders placed or performed during the hospital encounter of 10/21/22 (from the past 48 hour(s))  Glucose, capillary     Status: Abnormal   Collection Time: 10/22/22  7:48 PM  Result Value Ref Range   Glucose-Capillary 101 (H) 70 - 99 mg/dL    Comment: Glucose reference range applies only to samples taken after fasting for at least 8 hours.  Basic metabolic panel     Status: Abnormal   Collection Time: 10/22/22 10:16 PM  Result Value Ref Range   Sodium 142 135 - 145 mmol/L   Potassium 4.6 3.5 - 5.1 mmol/L    Chloride 105 98 - 111 mmol/L   CO2 23 22 - 32 mmol/L   Glucose, Bld 107 (H) 70 - 99 mg/dL    Comment: Glucose reference range applies only to samples taken after fasting for at least 8 hours.   BUN 22 (H) 6 - 20 mg/dL   Creatinine, Ser 0.88 0.61 - 1.24 mg/dL   Calcium 8.6 (L) 8.9 - 10.3 mg/dL   GFR, Estimated >60 >60 mL/min    Comment: (NOTE) Calculated using the  CKD-EPI Creatinine Equation (2021)    Anion gap 14 5 - 15    Comment: Performed at [facility], [place] [address], [place], [place] [address]  Magnesium     Status: Abnormal   Collection Time: 10/22/22 10:16 PM  Result Value Ref Range   Magnesium 1.6 (L) 1.7 - 2.4 mg/dL    Comment: Performed at [facility] [address] [place], [place] [address]  Glucose, capillary     Status: Abnormal   Collection Time: 10/22/22 11:34 PM  Result Value Ref Range   Glucose-Capillary 116 (H) 70 - 99 mg/dL    Comment: Glucose reference range applies only to samples taken after fasting for at least 8 hours.  Glucose, capillary     Status: Abnormal   Collection Time: 10/23/22  3:47 AM  Result Value Ref Range   Glucose-Capillary 105 (H) 70 - 99 mg/dL    Comment: Glucose reference range applies only to samples taken after fasting for at least 8 hours.  Magnesium     Status: None   Collection Time: 10/23/22  4:31 AM  Result Value Ref Range   Magnesium 1.7 1.7 - 2.4 mg/dL    Comment: Performed at [facility] [address], [place], [place] [address]  Phosphorus     Status: None   Collection Time: 10/23/22  4:31 AM  Result Value Ref Range   Phosphorus 3.1 2.5 - 4.6 mg/dL    Comment: Performed at [facility] [address], [place], [place] [address]  CBC with Differential/Platelet     Status: Abnormal   Collection Time: 10/23/22  4:31 AM  Result Value Ref Range   WBC 11.2 (H) 4.0 - 10.5 K/uL   RBC 3.36 (L) 4.22 - 5.81 MIL/uL   Hemoglobin 9.8 (L) 13.0 - 17.0 g/dL   HCT 30.4 (L) 39.0 - 52.0 %   MCV 90.5 80.0 -  100.0 fL   MCH 29.2 26.0 - 34.0 pg   MCHC 32.2 30.0 - 36.0 g/dL   RDW 13.3 11.5 - 15.5 %   Platelets 186 150 - 400 K/uL   nRBC 0.0 0.0 - 0.2 %   Neutrophils Relative % 74 %   Neutro Abs 8.3 (H) 1.7 - 7.7 K/uL   Lymphocytes Relative 13 %   Lymphs Abs 1.5 0.7 - 4.0 K/uL   Monocytes Relative 11 %   Monocytes Absolute 1.2 (H) 0.1 - 1.0 K/uL   Eosinophils Relative 1 %   Eosinophils Absolute 0.1 0.0 - 0.5 K/uL   Basophils Relative 0 %   Basophils Absolute 0.0 0.0 - 0.1 K/uL   Immature Granulocytes 1 %   Abs Immature Granulocytes 0.07 0.00 - 0.07 K/uL    Comment: Performed at [facility], [place] [address], [place], [place] [address]  Comprehensive metabolic panel     Status: Abnormal   Collection Time: 10/23/22  4:31 AM  Result Value Ref Range   Sodium 139 135 - 145 mmol/L   Potassium 3.8 3.5 - 5.1 mmol/L   Chloride 108 98 - 111 mmol/L   CO2 24 22 - 32 mmol/L   Glucose, Bld 113 (H) 70 - 99 mg/dL    Comment: Glucose reference range applies only to samples taken after fasting for at least 8 hours.   BUN 18 6 - 20 mg/dL   Creatinine, Ser 0.76 0.61 - 1.24 mg/dL   Calcium 8.2 (L) 8.9 - 10.3 mg/dL   Total Protein  5.3 (L) 6.5 - 8.1 g/dL   Albumin 1.6 (L) 3.5 - 5.0 g/dL   AST 11 (L) 15 - 41 U/L   ALT 6 0 - 44 U/L   Alkaline Phosphatase 40 38 - 126 U/L   Total Bilirubin 0.3 0.3 - 1.2 mg/dL   GFR, Estimated >60 >60 mL/min    Comment: (NOTE) Calculated using the CKD-EPI Creatinine Equation (2021)    Anion gap 7 5 - 15    Comment: Performed at [facility] [address], [place], [place] [address]  Heparin level (unfractionated)     Status: Abnormal   Collection Time: 10/23/22  4:31 AM  Result Value Ref Range   Heparin Unfractionated <0.10 (L) 0.30 - 0.70 IU/mL    Comment: (NOTE) The clinical reportable range upper limit is being lowered to >1.10 to align with the FDA approved guidance for the current laboratory assay.  If heparin results are below expected values, and  patient dosage has  been confirmed, suggest follow up testing of antithrombin III levels. Performed at [facility], [place] [address], [place], [place] [address]   Blood gas, arterial     Status: None   Collection Time: 10/23/22  5:35 AM  Result Value Ref Range   pH, Arterial 7.36 7.35 - 7.45   pCO2 arterial 47 32 - 48 mmHg   pO2, Arterial 91 83 - 108 mmHg   Bicarbonate 26.6 20.0 - 28.0 mmol/L   Acid-Base Excess 0.6 0.0 - 2.0 mmol/L   O2 Saturation 100 %   Patient temperature 36.8    Collection site LEFT RADIAL    Drawn by Roma Schanz ADAMS,RT    Allens test (pass/fail) PASS PASS    Comment: Performed at [facility] [address] [place], [place] [address]  Glucose, capillary     Status: Abnormal   Collection Time: 10/23/22  7:54 AM  Result Value Ref Range   Glucose-Capillary 110 (H) 70 - 99 mg/dL    Comment: Glucose reference range applies only to samples taken after fasting for at least 8 hours.  Glucose, capillary     Status: Abnormal   Collection Time: 10/23/22 11:17 AM  Result Value Ref Range   Glucose-Capillary 124 (H) 70 - 99 mg/dL    Comment: Glucose reference range applies only to samples taken after fasting for at least 8 hours.  Heparin level (unfractionated)     Status: Abnormal   Collection Time: 10/23/22 11:46 AM  Result Value Ref Range   Heparin Unfractionated <0.10 (L) 0.30 - 0.70 IU/mL    Comment: (NOTE) The clinical reportable range upper limit is being lowered to >1.10 to align with the FDA approved guidance for the current laboratory assay.  If heparin results are below expected values, and patient dosage has  been confirmed, suggest follow up testing of antithrombin III levels. Performed at [facility], [place] [address] [place], [place] [address]   Glucose, capillary     Status: Abnormal   Collection Time: 10/23/22  3:32 PM  Result Value Ref Range   Glucose-Capillary 115 (H) 70 - 99 mg/dL    Comment: Glucose reference range applies  only to samples taken after fasting for at least 8 hours.  Glucose, capillary     Status: Abnormal   Collection Time: 10/23/22  7:47 PM  Result Value Ref Range   Glucose-Capillary 123 (H) 70 - 99 mg/dL    Comment: Glucose reference range applies only to samples taken  after fasting for at least 8 hours.  Heparin level (unfractionated)     Status: Abnormal   Collection Time: 10/23/22  8:52 PM  Result Value Ref Range   Heparin Unfractionated <0.10 (L) 0.30 - 0.70 IU/mL    Comment: (NOTE) The clinical reportable range upper limit is being lowered to >1.10 to align with the FDA approved guidance for the current laboratory assay.  If heparin results are below expected values, and patient dosage has  been confirmed, suggest follow up testing of antithrombin III levels. Performed at [facility], [place] [address] [place], [place] [address]   Glucose, capillary     Status: None   Collection Time: 10/23/22 11:15 PM  Result Value Ref Range   Glucose-Capillary 94 70 - 99 mg/dL    Comment: Glucose reference range applies only to samples taken after fasting for at least 8 hours.  Glucose, capillary     Status: None   Collection Time: 10/24/22  3:17 AM  Result Value Ref Range   Glucose-Capillary 99 70 - 99 mg/dL    Comment: Glucose reference range applies only to samples taken after fasting for at least 8 hours.  CBC     Status: Abnormal   Collection Time: 10/24/22  6:00 AM  Result Value Ref Range   WBC 11.7 (H) 4.0 - 10.5 K/uL   RBC 3.73 (L) 4.22 - 5.81 MIL/uL   Hemoglobin 10.7 (L) 13.0 - 17.0 g/dL   HCT 33.1 (L) 39.0 - 52.0 %   MCV 88.7 80.0 - 100.0 fL   MCH 28.7 26.0 - 34.0 pg   MCHC 32.3 30.0 - 36.0 g/dL   RDW 13.3 11.5 - 15.5 %   Platelets 232 150 - 400 K/uL   nRBC 0.0 0.0 - 0.2 %    Comment: Performed at [facility], [place] [address], [place], [place] [address]  Magnesium     Status: None   Collection Time: 10/24/22  6:00 AM  Result Value Ref Range   Magnesium 2.2 1.7  - 2.4 mg/dL    Comment: Performed at [facility] [address] [place], [place] [address]  Heparin level (unfractionated)     Status: Abnormal   Collection Time: 10/24/22  6:00 AM  Result Value Ref Range   Heparin Unfractionated <0.10 (L) 0.30 - 0.70 IU/mL    Comment: (NOTE) The clinical reportable range upper limit is being lowered to >1.10 to align with the FDA approved guidance for the current laboratory assay.  If heparin results are below expected values, and patient dosage has  been confirmed, suggest follow up testing of antithrombin III levels. Performed at [facility], [place] [address], [place], [place] [address]   Aerobic Culture w Gram Stain (superficial specimen)     Status: None (Preliminary result)   Collection Time: 10/24/22  6:00 AM   Specimen: Wound  Result Value Ref Range   Specimen Description WOUND    Special Requests ABDOMEN    Gram Stain      FEW WBC PRESENT,BOTH PMN AND MONONUCLEAR RARE GRAM POSITIVE COCCI IN SINGLES Performed at [facility], [place] [address], [place], [place] [address]    Culture PENDING    Report Status PENDING   Cortisol     Status: None   Collection Time: 10/24/22  6:18 AM  Result Value Ref Range   Cortisol, Plasma 11.5 ug/dL    Comment: (NOTE) AM    6.7 - 22.6 ug/dL  PM   <10.0       ug/dL Performed at [facility] [address], [place], [place] [address]   TSH     Status: Abnormal   Collection Time: 10/24/22  6:18 AM  Result Value Ref Range   TSH 5.527 (H) 0.350 - 4.500 uIU/mL    Comment: Performed by a 3rd Generation assay with a functional sensitivity of <=0.01 uIU/mL. Performed at [facility], [place] [address] [place], [place] [address]   Glucose, capillary     Status: None   Collection Time: 10/24/22  6:43 AM  Result Value Ref Range   Glucose-Capillary 96 70 - 99 mg/dL    Comment: Glucose reference range applies only to samples taken after fasting for at least 8 hours.  Glucose,  capillary     Status: None   Collection Time: 10/24/22  7:50 AM  Result Value Ref Range   Glucose-Capillary 92 70 - 99 mg/dL    Comment: Glucose reference range applies only to samples taken after fasting for at least 8 hours.  Glucose, capillary     Status: Abnormal   Collection Time: 10/24/22 11:37 AM  Result Value Ref Range   Glucose-Capillary 115 (H) 70 - 99 mg/dL    Comment: Glucose reference range applies only to samples taken after fasting for at least 8 hours.  Heparin level (unfractionated)     Status: Abnormal   Collection Time: 10/24/22  3:03 PM  Result Value Ref Range   Heparin Unfractionated <0.10 (L) 0.30 - 0.70 IU/mL    Comment: (NOTE) The clinical reportable range upper limit is being lowered to >1.10 to align with the FDA approved guidance for the current laboratory assay.  If heparin results are below expected values, and patient dosage has  been confirmed, suggest follow up testing of antithrombin III levels. Performed at [facility], [place] [address], [place], [place] [address]   Glucose, capillary     Status: None   Collection Time: 10/24/22  4:30 PM  Result Value Ref Range   Glucose-Capillary 73 70 - 99 mg/dL    Comment: Glucose reference range applies only to samples taken after fasting for at least 8 hours.    CT ABDOMEN PELVIS W CONTRAST  Result Date: 10/24/2022 CLINICAL DATA:  Right lower quadrant abdominal pain. Abdominal hernia. EXAM: CT ABDOMEN AND PELVIS WITH CONTRAST TECHNIQUE: Multidetector CT imaging of the abdomen and pelvis was performed using the standard protocol following bolus administration of intravenous contrast. RADIATION DOSE REDUCTION: This exam was performed according to the departmental dose-optimization program which includes automated exposure control, adjustment of the mA and/or kV according to patient size and/or use of iterative reconstruction technique. CONTRAST:  26mL OMNIPAQUE IOHEXOL 350 MG/ML SOLN COMPARISON:  07/28/2021  FINDINGS: Lower chest: Elevated right hemidiaphragm. New small bilateral pleural effusions with passive atelectasis. Left anterior descending coronary atherosclerosis and descending thoracic aortic atherosclerosis. Substantial patchy nodular ground-glass and airspace opacities in the right upper lobe with some hazy ground-glass opacity in the left upper lobe, considerable atelectasis in the left lower lobe and right lower lobe, likely some degree of consolidation particularly in the right lower lobe. Patchy ground-glass densities in the lingula. There is some narrowing of the right mainstem bronchus at least partially due to dependent mucus within the airway, and also substantial narrowing of the bronchus intermedius and particularly right lower lobe tracheobronchial tree. Right lower lobe air bronchograms noted with truncation of the right lower lobe bronchus compatible  with airway plugging or occlusion. Right greater than left airway thickening noted. There is notable asymmetric edema in the subcutaneous tissues of the right lower chest wall spanning down towards the abdomen. Hepatobiliary: Pancreas: Unremarkable Spleen: Unremarkable Adrenals/Urinary Tract: Adrenal glands normal. Prior right nephrectomy. 2.7 by 1.8 cm hypodense exophytic lesion from the left kidney upper pole with internal density 28 Hounsfield units. A 1.3 cm lesion of the left mid kidney laterally on image 63 series 9 has an internal density of 25 Hounsfield units. Both are homogeneous and considered Bosniak category 2 cysts, and in not require further workup. A Bosniak category 1 cyst of the left kidney upper pole is also present. This does not require further workup. A Foley catheter is present in the urinary bladder. Right retroperitoneal edema as on image 54 series 3, particularly along the site of the prior nephrectomy site. Stomach/Bowel: Diffuse mild gastric wall thickening, cannot exclude gastritis. Right lower rib lateral osteotomies  noted with a right lumbar hernia, with laxity of the abdominal wall allowing herniation of small and large bowel, but no strangulation or complicating feature. This hernia has a wide neck. There are a few sigmoid colon diverticula.  No dilated bowel. Vascular/Lymphatic: Mild abdominal aortic atherosclerosis. No pathologic adenopathy. Reproductive: Unremarkable Other: Diffuse subcutaneous edema noted along the abdomen but especially along the right lateral abdominal wall. Small but abnormal amount of pelvic ascites. Mild diffuse mesenteric edema. Musculoskeletal: Enlarging abnormal fluid collection of the right rectus abdominus musculature no extends substantially into the subcutaneous tissues, measuring about 10.7 by 8.6 by 7.5 cm (volume = 360 cm^3). Surrounding inflammatory stranding in this region, tracking along the abdominal wall musculature cephalad and in the adjacent subcutaneous tissues. Right lumbar hernia with lax or torn lateral abdominal wall musculature and prior right lower rib osteotomies. Old bilateral rib fractures appear healed. Old left transverse process fractures in the lumbar spine at the L2, L3, and L4 levels. 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 There is foraminal impingement at all lumbar levels bilaterally. IMPRESSION: 1. Enlarging abnormal fluid collection of the right rectus abdominus musculature extending substantially into the subcutaneous tissues. This chronic collection could be an abscess or hematoma given the degree of surrounding inflammatory stranding in this region, tracking along the abdominal wall musculature cephalad and in the adjacent subcutaneous tissues. 2. New small bilateral pleural effusions with passive atelectasis. Elevated right hemidiaphragm. 3. Substantial patchy ground-glass and nodular airspace opacities in the right upper lobe with some hazy ground-glass opacity in the left upper lobe, favoring  bilateral pneumonia. 4. Right lower lobe air bronchograms with truncation of the right lower lobe bronchus compatible with airway plugging or occlusion. There is also substantial narrowing of the right bronchus intermedius and right lower lobe tracheobronchial tree. 5. Diffuse gastric wall thickening, cannot exclude gastritis. 6. Diffuse subcutaneous edema along the abdomen but especially along the right lateral abdominal wall. Small but abnormal amount of pelvic ascites. 7. Right lumbar hernia with laxity or tear of the abdominal wall allowing herniation of small and large bowel, but no strangulation or complicating feature. Prior right lower rib osteotomies noted. 8. Prior right nephrectomy. There is some edema in the right retroperitoneum along the site of the prior nephrectomy site. 9. Bosniak category 2 cysts of the left kidney upper pole and mid kidney, no follow up needed. 10. Aortic atherosclerosis. 11. Multilevel lumbar impingement. Aortic Atherosclerosis (ICD10-I70.0). Electronically Signed   By: Van Clines M.D.   On: 10/24/2022  13:18   US Abdomen Limited  Result Date: 10/24/2022 CLINICAL DATA:  Mass in abdomen. EXAM: ULTRASOUND ABDOMEN LIMITED COMPARISON:  07/28/2021. FINDINGS: In the right mid lateral chest, there is an ill-defined hypoechoic heterogeneous avascular mass measuring 6.7 x 3.6 x 6.8 cm. In the second region of concern, there are multiple defects in the anterior abdominal wall superior to the umbilicus with elongated hypoechoic structure possible packing material, not well delineated on exam. IMPRESSION: 1. Complex heterogeneous hypoechoic structure in the right mid lateral chest measuring 6.7 x 3.6 x 6.8 cm, possible hematoma or hernia. CT is suggested for further characterization. 2. Defect in the anterior abdominal wall in the midline above the umbilicus with tubular structure deep to this region and not well delineated on exam, possible postsurgical changes or hernia. CT may  be beneficial for further evaluation. Electronically Signed   By: Brett Fairy M.D.   On: 10/24/2022 02:35    Review of Systems  Unable to perform ROS: Patient nonverbal   Blood pressure 105/80, pulse (!) 57, temperature 97.8 F (36.6 C), temperature source Axillary, resp. rate (!) 31, height 5\' 8"  (1.727 m), weight 61.3 kg, SpO2 99 %. Physical Exam Vitals reviewed.  Constitutional:      General: He is in acute distress (looks uncomfortable).     Appearance: He is normal weight. He is ill-appearing. He is not toxic-appearing or diaphoretic.  HENT:     Right Ear: External ear normal.     Left Ear: External ear normal.     Nose: Nose normal.     Mouth/Throat:     Mouth: Mucous membranes are moist.     Comments: Grinding teeth aggressively Eyes:     General: No scleral icterus.    Conjunctiva/sclera: Conjunctivae normal.     Pupils: Pupils are equal, round, and reactive to light.  Cardiovascular:     Rate and Rhythm: Bradycardia present.     Pulses: Normal pulses.  Pulmonary:     Effort: Pulmonary effort is normal.     Breath sounds: No wheezing or rhonchi.     Comments: Mild tachypnea as he was just moved.   Abdominal:     General: There is no distension.     Palpations: Abdomen is soft. There is no mass.     Tenderness: There is abdominal tenderness.       Comments: Erythema on right abdominal wall and area of fluctuance.  No active drainage.    Musculoskeletal:        General: No tenderness or signs of injury.     Cervical back: Neck supple.     Comments: Tight extremities with some contractures  Skin:    General: Skin is warm and dry.     Capillary Refill: Capillary refill takes 2 to 3 seconds.     Coloration: Skin is pale. Skin is not jaundiced.     Findings: No bruising, erythema or lesion.  Neurological:     Mental Status: He is alert. Mental status is at baseline.     Motor: No weakness.  Psychiatric:     Comments: Unable to assess given non verbal status.       Assessment/Plan:  Right abdominal wall abscess. History of infected mesh Right lumbar hernia Pneumonia VDRF, just extubated Sepsis A fib H/o adrenal insufficiency.   Abdominal wall abscess will need drainage in some way. Warm compresses for now. Pt high risk for general anesthesia given recent pneumonia.  ? Ability to do bedside I&D.  He is alert enough and strong enough that I think he would need sedation for bedside I&D and that may be more risky for his airway.   Aspiration would likely not given adequate drainage, and the overlying skin is thin, so perc drain not needed.    Discussed with sister.  Given recent extubation and h/o some spontaneous drainage yesterday, will see how he does tomorrow.  If no spontaneous drainage, will plan OR Monday.    Stark Klein 10/24/2022, 6:02 PM

## 2022-10-25 DIAGNOSIS — J9601 Acute respiratory failure with hypoxia: Secondary | ICD-10-CM | POA: Diagnosis not present

## 2022-10-25 LAB — CBC
HCT: 31.4 % — ABNORMAL LOW (ref 39.0–52.0)
Hemoglobin: 10.1 g/dL — ABNORMAL LOW (ref 13.0–17.0)
MCH: 28.4 pg (ref 26.0–34.0)
MCHC: 32.2 g/dL (ref 30.0–36.0)
MCV: 88.2 fL (ref 80.0–100.0)
Platelets: 223 K/uL (ref 150–400)
RBC: 3.56 MIL/uL — ABNORMAL LOW (ref 4.22–5.81)
RDW: 13.4 % (ref 11.5–15.5)
WBC: 11.2 K/uL — ABNORMAL HIGH (ref 4.0–10.5)
nRBC: 0 % (ref 0.0–0.2)

## 2022-10-25 LAB — GLUCOSE, CAPILLARY
Glucose-Capillary: 78 mg/dL (ref 70–99)
Glucose-Capillary: 63 mg/dL — ABNORMAL LOW (ref 70–99)
Glucose-Capillary: 66 mg/dL — ABNORMAL LOW (ref 70–99)
Glucose-Capillary: 118 mg/dL — ABNORMAL HIGH (ref 70–99)
Glucose-Capillary: 75 mg/dL (ref 70–99)
Glucose-Capillary: 61 mg/dL — ABNORMAL LOW (ref 70–99)
Glucose-Capillary: 109 mg/dL — ABNORMAL HIGH (ref 70–99)
Glucose-Capillary: 104 mg/dL — ABNORMAL HIGH (ref 70–99)
Glucose-Capillary: 55 mg/dL — ABNORMAL LOW (ref 70–99)
Glucose-Capillary: 95 mg/dL (ref 70–99)

## 2022-10-25 LAB — POCT I-STAT EG7
Acid-Base Excess: 2 mmol/L (ref 0.0–2.0)
Bicarbonate: 24.6 mmol/L (ref 20.0–28.0)
Calcium, Ion: 1.12 mmol/L — ABNORMAL LOW (ref 1.15–1.40)
HCT: 26 % — ABNORMAL LOW (ref 39.0–52.0)
Hemoglobin: 8.8 g/dL — ABNORMAL LOW (ref 13.0–17.0)
O2 Saturation: 46 %
Patient temperature: 98.2
Potassium: 4 mmol/L (ref 3.5–5.1)
Sodium: 131 mmol/L — ABNORMAL LOW (ref 135–145)
TCO2: 25 mmol/L (ref 22–32)
pCO2, Ven: 28 mmHg — ABNORMAL LOW (ref 44–60)
pH, Ven: 7.551 — ABNORMAL HIGH (ref 7.25–7.43)
pO2, Ven: 21 mmHg — CL (ref 32–45)

## 2022-10-25 LAB — TRIGLYCERIDES: Triglycerides: 171 mg/dL — ABNORMAL HIGH (ref <150)

## 2022-10-25 MED ORDER — SENNOSIDES-DOCUSATE SODIUM 8.6-50 MG PO TABS
1.0000 | ORAL_TABLET | Freq: Two times a day (BID) | ORAL | Status: DC
  Filled 2022-10-25 (×2): qty 1

## 2022-10-25 MED ORDER — LEVOTHYROXINE SODIUM 25 MCG PO TABS: 225.0000 ug | ORAL_TABLET | Freq: Every day | ORAL | Status: DC

## 2022-10-25 MED ORDER — DEXTROSE 50 % IV SOLN
INTRAVENOUS | Status: AC
  Filled 2022-10-25: qty 50

## 2022-10-25 MED ORDER — LORAZEPAM 2 MG/ML IJ SOLN
1.0000 mg | INTRAMUSCULAR | Status: AC | PRN
  Administered 2022-10-25 – 2022-10-28 (×3): 1 mg via INTRAVENOUS
  Filled 2022-10-25 (×3): qty 1

## 2022-10-25 MED ORDER — DEXTROSE 50 % IV SOLN
12.5000 g | INTRAVENOUS | Status: AC
  Administered 2022-10-25: 12.5 g via INTRAVENOUS

## 2022-10-25 MED ORDER — ACETAMINOPHEN 10 MG/ML IV SOLN
1000.0000 mg | Freq: Once | INTRAVENOUS | Status: AC
  Administered 2022-10-25: 1000 mg via INTRAVENOUS
  Filled 2022-10-25: qty 100

## 2022-10-25 MED ORDER — DEXTROSE 50 % IV SOLN
INTRAVENOUS | Status: AC
  Administered 2022-10-25: 50 mL
  Filled 2022-10-25: qty 50

## 2022-10-25 MED ORDER — PANTOPRAZOLE SODIUM 40 MG PO TBEC: 40.0000 mg | DELAYED_RELEASE_TABLET | Freq: Every day | ORAL | Status: DC

## 2022-10-25 MED ORDER — DEXTROSE-NACL 5-0.9 % IV SOLN: INTRAVENOUS | Status: DC

## 2022-10-25 MED ORDER — FLUDROCORTISONE ACETATE 0.1 MG PO TABS
0.1000 mg | ORAL_TABLET | Freq: Every day | ORAL | Status: DC
  Filled 2022-10-25: qty 1

## 2022-10-25 MED ORDER — CLONAZEPAM 0.5 MG PO TABS
0.5000 mg | ORAL_TABLET | Freq: Two times a day (BID) | ORAL | Status: DC
  Filled 2022-10-25 (×2): qty 1

## 2022-10-25 MED ORDER — SERTRALINE HCL 50 MG PO TABS
75.0000 mg | ORAL_TABLET | Freq: Every day | ORAL | Status: DC
  Filled 2022-10-25: qty 2

## 2022-10-25 MED ORDER — HALOPERIDOL LACTATE 5 MG/ML IJ SOLN
2.0000 mg | Freq: Once | INTRAMUSCULAR | Status: AC
  Administered 2022-10-25: 2 mg via INTRAVENOUS
  Filled 2022-10-25: qty 1

## 2022-10-25 MED ORDER — ORAL CARE MOUTH RINSE: 15.0000 mL | OROMUCOSAL | Status: DC | PRN

## 2022-10-25 MED ORDER — POLYETHYLENE GLYCOL 3350 17 G PO PACK
17.0000 g | PACK | Freq: Every day | ORAL | Status: DC
  Administered 2022-10-26: 17 g via ORAL
  Filled 2022-10-25: qty 1

## 2022-10-25 NOTE — Evaluation (Signed)
Physical Therapy Evaluation Patient Details Name: [name] MRN: [id number] DOB: [birth year] Today's Date: 10/25/2022  History of Present Illness  Pt is a 56 y.o. male who presented 10/21/22 with acute hypoxic respiratory failure in the setting of bilateral lower lobe pneumonia. ETT 10/25-10/28. Found to have large abscess on the R abdominal wall and a moderate to large right lumbar hernia that was not incarcerated, may plan for surgery 10/30. PMH: cardiac arrest with anoxic brain injury, Addison disease, afib, dementia, dysphagia, UTIs, HLD, hypothyroidism, seizure   Clinical Impression  Pt presents with condition above and deficits mentioned below, see PT Problem List. PTA, he was residing at a SNF. His sister reports she is unsure when pt last ambulated, but required assistance for transfers. She reports nursing would often leave pt in bed or on the floor mats due to pt often trying to get up impulsively and then falling. She desires to potentially find a new facility/home for the pt. Currently, pt appears to likely be functioning not far from his baseline, but would benefit from further acute PT and follow-up PT at a SNF to improve his pulmonary function, reduce his risk for contractures, and improve his safety and independence with mobility as pt appears to want to mobilize more. He is currently requiring mod-maxA for bed mobility and modA to transfer to stand. He tends to lean to his R and display deficits in overall strength (R may be weaker), static and dynamic balance, cognition, and communication. He appears to understand cues but has difficulty expressing himself.      Recommendations for follow up therapy are one component of a multi-disciplinary discharge planning process, led by the attending physician.  Recommendations may be updated based on patient status, additional functional criteria and insurance authorization.  Follow Up Recommendations Skilled nursing-short term rehab (<3  hours/day) Can patient physically be transported by private vehicle: No    Assistance Recommended at Discharge Frequent or constant Supervision/Assistance  Patient can return home with the following  Two people to help with walking and/or transfers;A lot of help with bathing/dressing/bathroom;Assistance with feeding;Assistance with cooking/housework;Direct supervision/assist for medications management;Direct supervision/assist for financial management;Assist for transportation;Help with stairs or ramp for entrance    Equipment Recommendations None recommended by PT  Recommendations for Other Services  OT consult    Functional Status Assessment Patient has had a recent decline in their functional status and demonstrates the ability to make significant improvements in function in a reasonable and predictable amount of time.     Precautions / Restrictions Precautions Precautions: Fall Precaution Comments: posey; bil mittens; watch BP and SpO2 Restrictions Weight Bearing Restrictions: No      Mobility  Bed Mobility Overal bed mobility: Needs Assistance Bed Mobility: Supine to Sit, Sit to Supine, Rolling Rolling: Mod assist   Supine to sit: Max assist, HOB elevated Sit to supine: Mod assist, HOB elevated   General bed mobility comments: ModA to rotate trunk to roll either direction. Pt would lift trunk and buttocks with increased time to allow PT to maneuver bedding. MaxA to bring trunk up to sit and pivot hips to EOB. ModA to pivot trunk and lift legs onto bed, pt already leaning to R to [facility]    Transfers Overall transfer level: Needs assistance Equipment used: 1 person hand held assist Transfers: Sit to/from Stand Sit to Stand: Mod assist           General transfer comment: ModA and bil knee block provided to stand at EOB, unable to  take steps at this time    Ambulation/Gait               General Gait Details: unable at this time  Stairs             Wheelchair Mobility    Modified Rankin (Stroke Patients Only)       Balance Overall balance assessment: Needs assistance Sitting-balance support: Single extremity supported, Feet supported Sitting balance-Leahy Scale: Poor Sitting balance - Comments: Tends to lean to R,  needing modA for sitting balance. Postural control: Right lateral lean Standing balance support: Bilateral upper extremity supported Standing balance-Leahy Scale: Poor Standing balance comment: Reliant on modA and UE support to stand statically                             Pertinent Vitals/Pain Pain Assessment Pain Assessment: Faces Faces Pain Scale: Hurts a little bit Pain Location: generalized grimacing with mobility (grinds teeth frequently which sister reported was his baseline) Pain Descriptors / Indicators: Grimacing Pain Intervention(s): Limited activity within patient's tolerance, Monitored during session, Repositioned    Home Living Family/patient expects to be discharged to:: Skilled nursing facility                   Additional Comments: Per discussion with sister via phone call, pt has primarily been remaining in bed or on the bed floor mats as he can be impulsive to stand and fall often, thus nursing does not usually get him up for his safety    Prior Function Prior Level of Function : Needs assist             Mobility Comments: Pt able to stand with assistance at SNF. Sister was unsure when he last ambulated with staff or PT       Hand Dominance        Extremity/Trunk Assessment   Upper Extremity Assessment Upper Extremity Assessment: Defer to OT evaluation    Lower Extremity Assessment Lower Extremity Assessment: Generalized weakness;RLE deficits/detail RLE Deficits / Details: noted big and 2nd toe curling into each other; appeared to be weaker on R than on L, but difficult to assess    Cervical / Trunk Assessment Cervical / Trunk Assessment: Kyphotic  (holds neck in R lateral flexion)  Communication   Communication: Expressive difficulties  Cognition Arousal/Alertness: Awake/alert Behavior During Therapy: Restless, Impulsive Overall Cognitive Status: History of cognitive impairments - at baseline                                 General Comments: Pt with hx of impulsive/restless tendencies to try to get OOB, often causing falls at SNF. Pt with legs over side of bed upon arrival, but posey belt in place. Hx of difficulty expressing self, only speaking one word phrases and intermittent simple sentences. He did answer "yes" intermittently and stated "I love you" once he was sitting up EOB. He does appear to comprehend cues, following ~75% of simple cues with extra time to process.        General Comments General comments (skin integrity, edema, etc.): SpO2 appeared stable on 2L O2 when it would read; BP 80/65 at rest upon arrival, 111/86 supine end of session after mobilizing with PT    Exercises     Assessment/Plan    PT Assessment Patient needs continued PT services  PT Problem List Decreased strength;Decreased  activity tolerance;Decreased range of motion;Decreased balance;Decreased mobility;Decreased coordination;Decreased cognition;Decreased safety awareness;Cardiopulmonary status limiting activity       PT Treatment Interventions DME instruction;Functional mobility training;Therapeutic activities;Therapeutic exercise;Balance training;Neuromuscular re-education;Cognitive remediation;Gait training;Patient/family education;Wheelchair mobility training    PT Goals (Current goals can be found in the Care Plan section)  Acute Rehab PT Goals Patient Stated Goal: pt desiring to sit EOB; sister wishes to find a new facility or take pt home PT Goal Formulation: With patient/family Time For Goal Achievement: 11/08/22 Potential to Achieve Goals: Fair    Frequency Min 2X/week     Co-evaluation               AM-PAC  PT "6 Clicks" Mobility  Outcome Measure Help needed turning from your back to your side while in a flat bed without using bedrails?: A Lot Help needed moving from lying on your back to sitting on the side of a flat bed without using bedrails?: A Lot Help needed moving to and from a bed to a chair (including a wheelchair)?: A Lot Help needed standing up from a chair using your arms (e.g., wheelchair or bedside chair)?: A Lot Help needed to walk in hospital room?: Total Help needed climbing 3-5 steps with a railing? : Total 6 Click Score: 10    End of Session Equipment Utilized During Treatment: Oxygen Activity Tolerance: Patient tolerated treatment well Patient left: in bed;with call bell/phone within reach;with bed alarm set;with restraints reapplied Nurse Communication: Mobility status;Other (comment) (vitals) PT Visit Diagnosis: Unsteadiness on feet (R26.81);Muscle weakness (generalized) (M62.81);Difficulty in walking, not elsewhere classified (R26.2);Other symptoms and signs involving the nervous system (R29.898)    Time: 9326-7124 PT Time Calculation (min) (ACUTE ONLY): 34 min   Charges:   PT Evaluation $PT Eval Moderate Complexity: 1 Mod PT Treatments $Therapeutic Activity: 8-22 mins        Moishe Spice, PT, DPT Acute Rehabilitation Services  Office: [phone]   Orvan Falconer 10/25/2022, 5:25 PM

## 2022-10-25 NOTE — Progress Notes (Signed)
Subjective/Chief Complaint: Appears comfortable Does not indicate any abdominal tenderness   Objective: Vital signs in last 24 hours: Temp:  [96.9 F (36.1 C)-97.8 F (36.6 C)] 96.9 F (36.1 C) (10/29 0805) Pulse Rate:  [38-69] 55 (10/29 0630) Resp:  [14-36] 18 (10/29 0630) BP: (49-133)/(36-96) 94/69 (10/29 0630) SpO2:  [90 %-100 %] 100 % (10/29 0809) Weight:  [72 kg] 72 kg (10/29 0430) Last BM Date :  (PTA)  Intake/Output from previous day: 10/28 0701 - 10/29 0700 In: 3454.8 [I.V.:2896.6; NG/GT:158.3; IV Piggyback:399.9] Out: 675 [Urine:675] Intake/Output this shift: Total I/O In: -  Out: 125 [Urine:125]  Right abdominal wall erythema and fluctuance - 10 cm  Lab Results:  Recent Labs    10/24/22 0600 10/25/22 0702  WBC 11.7* 11.2*  HGB 10.7* 10.1*  HCT 33.1* 31.4*  PLT 232 223   BMET Recent Labs    10/22/22 2216 10/23/22 0431  NA 142 139  K 4.6 3.8  CL 105 108  CO2 23 24  GLUCOSE 107* 113*  BUN 22* 18  CREATININE 0.88 0.76  CALCIUM 8.6* 8.2*   PT/INR No results for input(s): "LABPROT", "INR" in the last 72 hours. ABG Recent Labs    10/23/22 0535  PHART 7.36  HCO3 26.6    Studies/Results: CT ABDOMEN PELVIS W CONTRAST  Result Date: 10/24/2022 CLINICAL DATA:  Right lower quadrant abdominal pain. Abdominal hernia. EXAM: CT ABDOMEN AND PELVIS WITH CONTRAST TECHNIQUE: Multidetector CT imaging of the abdomen and pelvis was performed using the standard protocol following bolus administration of intravenous contrast. RADIATION DOSE REDUCTION: This exam was performed according to the departmental dose-optimization program which includes automated exposure control, adjustment of the mA and/or kV according to patient size and/or use of iterative reconstruction technique. CONTRAST:  80mL OMNIPAQUE IOHEXOL 350 MG/ML SOLN COMPARISON:  07/28/2021 FINDINGS: Lower chest: Elevated right hemidiaphragm. New small bilateral pleural effusions with passive  atelectasis. Left anterior descending coronary atherosclerosis and descending thoracic aortic atherosclerosis. Substantial patchy nodular ground-glass and airspace opacities in the right upper lobe with some hazy ground-glass opacity in the left upper lobe, considerable atelectasis in the left lower lobe and right lower lobe, likely some degree of consolidation particularly in the right lower lobe. Patchy ground-glass densities in the lingula. There is some narrowing of the right mainstem bronchus at least partially due to dependent mucus within the airway, and also substantial narrowing of the bronchus intermedius and particularly right lower lobe tracheobronchial tree. Right lower lobe air bronchograms noted with truncation of the right lower lobe bronchus compatible with airway plugging or occlusion. Right greater than left airway thickening noted. There is notable asymmetric edema in the subcutaneous tissues of the right lower chest wall spanning down towards the abdomen. Hepatobiliary: Pancreas: Unremarkable Spleen: Unremarkable Adrenals/Urinary Tract: Adrenal glands normal. Prior right nephrectomy. 2.7 by 1.8 cm hypodense exophytic lesion from the left kidney upper pole with internal density 28 Hounsfield units. A 1.3 cm lesion of the left mid kidney laterally on image 63 series 9 has an internal density of 25 Hounsfield units. Both are homogeneous and considered Bosniak category 2 cysts, and in not require further workup. A Bosniak category 1 cyst of the left kidney upper pole is also present. This does not require further workup. A Foley catheter is present in the urinary bladder. Right retroperitoneal edema as on image 54 series 3, particularly along the site of the prior nephrectomy site. Stomach/Bowel: Diffuse mild gastric wall thickening, cannot exclude gastritis. Right lower rib lateral osteotomies  noted with a right lumbar hernia, with laxity of the abdominal wall allowing herniation of small and large  bowel, but no strangulation or complicating feature. This hernia has a wide neck. There are a few sigmoid colon diverticula.  No dilated bowel. Vascular/Lymphatic: Mild abdominal aortic atherosclerosis. No pathologic adenopathy. Reproductive: Unremarkable Other: Diffuse subcutaneous edema noted along the abdomen but especially along the right lateral abdominal wall. Small but abnormal amount of pelvic ascites. Mild diffuse mesenteric edema. Musculoskeletal: Enlarging abnormal fluid collection of the right rectus abdominus musculature no extends substantially into the subcutaneous tissues, measuring about 10.7 by 8.6 by 7.5 cm (volume = 360 cm^3). Surrounding inflammatory stranding in this region, tracking along the abdominal wall musculature cephalad and in the adjacent subcutaneous tissues. Right lumbar hernia with lax or torn lateral abdominal wall musculature and prior right lower rib osteotomies. Old bilateral rib fractures appear healed. Old left transverse process fractures in the lumbar spine at the L2, L3, and L4 levels. 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 There is foraminal impingement at all lumbar levels bilaterally. IMPRESSION: 1. Enlarging abnormal fluid collection of the right rectus abdominus musculature extending substantially into the subcutaneous tissues. This chronic collection could be an abscess or hematoma given the degree of surrounding inflammatory stranding in this region, tracking along the abdominal wall musculature cephalad and in the adjacent subcutaneous tissues. 2. New small bilateral pleural effusions with passive atelectasis. Elevated right hemidiaphragm. 3. Substantial patchy ground-glass and nodular airspace opacities in the right upper lobe with some hazy ground-glass opacity in the left upper lobe, favoring bilateral pneumonia. 4. Right lower lobe air bronchograms with truncation of the right lower lobe bronchus  compatible with airway plugging or occlusion. There is also substantial narrowing of the right bronchus intermedius and right lower lobe tracheobronchial tree. 5. Diffuse gastric wall thickening, cannot exclude gastritis. 6. Diffuse subcutaneous edema along the abdomen but especially along the right lateral abdominal wall. Small but abnormal amount of pelvic ascites. 7. Right lumbar hernia with laxity or tear of the abdominal wall allowing herniation of small and large bowel, but no strangulation or complicating feature. Prior right lower rib osteotomies noted. 8. Prior right nephrectomy. There is some edema in the right retroperitoneum along the site of the prior nephrectomy site. 9. Bosniak category 2 cysts of the left kidney upper pole and mid kidney, no follow up needed. 10. Aortic atherosclerosis. 11. Multilevel lumbar impingement. Aortic Atherosclerosis (ICD10-I70.0). Electronically Signed   By: Gaylyn Rong M.D.   On: 10/24/2022 13:18   US Abdomen Limited  Result Date: 10/24/2022 CLINICAL DATA:  Mass in abdomen. EXAM: ULTRASOUND ABDOMEN LIMITED COMPARISON:  07/28/2021. FINDINGS: In the right mid lateral chest, there is an ill-defined hypoechoic heterogeneous avascular mass measuring 6.7 x 3.6 x 6.8 cm. In the second region of concern, there are multiple defects in the anterior abdominal wall superior to the umbilicus with elongated hypoechoic structure possible packing material, not well delineated on exam. IMPRESSION: 1. Complex heterogeneous hypoechoic structure in the right mid lateral chest measuring 6.7 x 3.6 x 6.8 cm, possible hematoma or hernia. CT is suggested for further characterization. 2. Defect in the anterior abdominal wall in the midline above the umbilicus with tubular structure deep to this region and not well delineated on exam, possible postsurgical changes or hernia. CT may be beneficial for further evaluation. Electronically Signed   By: Thornell Sartorius M.D.   On: 10/24/2022  02:35    Anti-infectives: Anti-infectives (  From admission, onward)    Start     Dose/Rate Route Frequency Ordered Stop   10/24/22 2200  vancomycin (VANCOCIN) IVPB 1000 mg/200 mL premix        1,000 mg 200 mL/hr over 60 Minutes Intravenous Every 12 hours 10/24/22 1138     10/24/22 1230  vancomycin (VANCOREADY) IVPB 1250 mg/250 mL        1,250 mg 166.7 mL/hr over 90 Minutes Intravenous  Once 10/24/22 1138 10/24/22 1431   10/23/22 1130  cefTRIAXone (ROCEPHIN) 2 g in sodium chloride 0.9 % 100 mL IVPB        2 g 200 mL/hr over 30 Minutes Intravenous Every 24 hours 10/23/22 1041 10/27/22 1129   10/22/22 2200  ceFEPIme (MAXIPIME) 2 g in sodium chloride 0.9 % 100 mL IVPB  Status:  Discontinued        2 g 200 mL/hr over 30 Minutes Intravenous Every 8 hours 10/22/22 1559 10/23/22 1041   10/22/22 0900  vancomycin (VANCOREADY) IVPB 750 mg/150 mL  Status:  Discontinued        750 mg 150 mL/hr over 60 Minutes Intravenous Every 12 hours 10/21/22 2041 10/22/22 1559   10/22/22 0700  ceFEPIme (MAXIPIME) 2 g in sodium chloride 0.9 % 100 mL IVPB  Status:  Discontinued        2 g 200 mL/hr over 30 Minutes Intravenous Every 12 hours 10/21/22 2041 10/22/22 1559   10/21/22 1900  vancomycin (VANCOREADY) IVPB 1750 mg/350 mL        1,750 mg 175 mL/hr over 120 Minutes Intravenous  Once 10/21/22 1854 10/21/22 2125   10/21/22 1900  ceFEPIme (MAXIPIME) 2 g in sodium chloride 0.9 % 100 mL IVPB        2 g 200 mL/hr over 30 Minutes Intravenous  Once 10/21/22 1854 10/21/22 1924   10/21/22 1900  azithromycin (ZITHROMAX) 500 mg in sodium chloride 0.9 % 250 mL IVPB  Status:  Discontinued        500 mg 250 mL/hr over 60 Minutes Intravenous Every 24 hours 10/21/22 1854 10/23/22 1041   10/21/22 1845  vancomycin (VANCOCIN) IVPB 1000 mg/200 mL premix  Status:  Discontinued        1,000 mg 200 mL/hr over 60 Minutes Intravenous  Once 10/21/22 1835 10/21/22 1851   10/21/22 1845  levofloxacin (LEVAQUIN) IVPB 750 mg  Status:   Discontinued        750 mg 100 mL/hr over 90 Minutes Intravenous  Once 10/21/22 1835 10/21/22 1849       Assessment/Plan: Right abdominal wall abscess. History of infected mesh Right lumbar hernia Pneumonia VDRF, just extubated Sepsis A fib H/o adrenal insufficiency.    Abdominal wall abscess will need drainage, most likely under general anesthesia since the abscess seems to track down into the muscle. Pt high risk for general anesthesia given recent pneumonia.  ? Ability to do bedside I&D.   He is alert enough and strong enough that I think he would need sedation for bedside I&D and that may be more risky for his airway.   Aspiration would likely not given adequate drainage, and the overlying skin is thin, so perc drain not needed.   Keep NPO after midnight  LOS: 4 days    Wynona Luna 10/25/2022

## 2022-10-25 NOTE — Progress Notes (Addendum)
[facility] Progress Note Patient Name: [name] DOB: 03-Mar-1966 MRN: [id number]   Date of Service  10/25/2022  HPI/Events of Note  Pt having frequent hypoglycemic event requiring d%) x 3 today. Currently NPO except meds, NS KVO running.  ABI.  eICU Interventions  Ordered dextrose saline fluid     Intervention Category Intermediate Interventions: Other: (hypoglycemia)  Elmer Sow 10/25/2022, 7:48 PM  21:17 Increasingly agitated, got ativan. On precedex gtt. Trying to pull out, getting out of bed per RN discussion. No fever. Not in pain Going to OR tomorrow for abdominal wall abscess drainage. On 2 lit o2, sats 94%. HR is at 68, precedex at 0.6  Agitation. Trying to pull out lines, tubes.  - Haldol trial for now. Call back if not better.  Asp precautions.  - get ABG stat, pco2 was at 47. For any worsening.   23:00 VBG: resp alkalosis. From anxiety and pain.  Continue care.

## 2022-10-25 NOTE — Evaluation (Signed)
Clinical/Bedside Swallow Evaluation Patient Details  Name: [name] MRN: [id number] Date of Birth: 1966-11-25  Today's Date: 10/25/2022 Time: SLP Start Time (ACUTE ONLY): 0957 SLP Stop Time (ACUTE ONLY): 1012 SLP Time Calculation (min) (ACUTE ONLY): 15 min  Past Medical History:  Past Medical History:  Diagnosis Date   Addison disease (HCC)    Anoxic brain injury (HCC)    TBI   Anxiety    Aspiration pneumonia (HCC)    Atrial fibrillation (HCC)    Chronic constipation 11/23/2011   Dementia (HCC)    Dysphagia    Encephalopathy    Frontal lobe syndrome    Gait abnormality    freq falls   Glaucoma    Glucocorticoid deficiency (HCC)    History of heart attack    History of recurrent UTIs    Hyperlipemia    Hypothyroidism    Mental disorder    Myocardial infarction (HCC)    Pneumoperitoneum of unknown etiology 12/21/2011   Quadriplegia (HCC)    Reflux    Seizure disorder (HCC)    Weakness of both legs    Past Surgical History:  Past Surgical History:  Procedure Laterality Date   DEBRIDEMENT OF ABDOMINAL WALL ABSCESS  01/26/2018   HERNIA MESH REMOVAL  01/26/2018   Infected mesh - removed w I&D abd wall abscess   IR GENERIC HISTORICAL  10/20/2016   [place] GASTRO/COLONIC TUBE PERCUT W/FLUORO 10/20/2016 Oley Balm, MD [facility]   IR REPLACE G-TUBE SIMPLE WO FLUORO  01/28/2018   IR [place] GASTRO/COLONIC TUBE PERCUT W/FLUORO  04/21/2017   IR [place]PLC GASTRO/COLONIC TUBE PERCUT W/FLUORO  07/22/2017   IR [place]PLC GASTRO/COLONIC TUBE PERCUT W/FLUORO  11/24/2017   IR REPLC GASTRO/COLONIC TUBE PERCUT W/FLUORO  08/12/2020   IRRIGATION AND DEBRIDEMENT ABSCESS N/A 01/26/2018   Procedure: IRRIGATION AND DEBRIDEMENT ABDOMINAL WALL ABSCESS WITH REMOVAL OF INFECTED MESH;  Surgeon: Karie Soda, MD;  Location: [facility];  Service: General;  Laterality: N/A;   NEPHRECTOMY  unknown   PEG TUBE PLACEMENT     HPI:  56 year old male with prior history of cardiac arrest with anoxic brain  injury who presented with acute hypoxic respiratory failure in the setting of bilateral lower lobe pneumonia requiring endotracheal intubation.  Pt was intubated from 10/25-10/28.  MBS and BSE in 2018 recommending Dysphagia 3 solids and honey-thick liquids.    Assessment / Plan / Recommendation  Clinical Impression  Pt was seen for a bedside swallow evaluation and he presents with suspected oropharyngeal dysphagia.  Pt has a known hx of dysphagia; however he was unable to state what his baseline diet is.  Pt was observed to be restless and impulsive during this evaluation, and he intermittently followed commands for oral mechanism examination.  Pt consumed trials of thin liquid, nectar-thick liquid, honey-thick liquid and puree.  He was noted to take large sips of liquid despite verbal and tactile cues for small sips.  An immediate cough was observed with all liquid trials, most prolonged and congested with thin liquid.  Audible swallow and mildly increased RR was observed with puree, but no overt s/sx of aspiration were observed.  Given high risk for aspiration, recommend continuation of NPO with essential oral medication administered crushed in puree.  Pt would likely benefit from an instrumental swallow study if he is able/willing to participate.  SLP Visit Diagnosis: Dysphagia, oropharyngeal phase (R13.12)    Aspiration Risk  Moderate aspiration risk;Risk for inadequate nutrition/hydration    Diet Recommendation NPO except meds  Medication Administration: Crushed with puree (essential oral meds only)    Other  Recommendations Oral Care Recommendations: Oral care QID;Staff/trained caregiver to provide oral care    Recommendations for follow up therapy are one component of a multi-disciplinary discharge planning process, led by the attending physician.  Recommendations may be updated based on patient status, additional functional criteria and insurance authorization.  Follow up Recommendations  Skilled nursing-short term rehab (<3 hours/day)      Assistance Recommended at Discharge Frequent or constant Supervision/Assistance  Functional Status Assessment Patient has had a recent decline in their functional status and demonstrates the ability to make significant improvements in function in a reasonable and predictable amount of time.  Frequency and Duration min 2x/week  2 weeks       Prognosis Prognosis for Safe Diet Advancement: Fair Barriers to Reach Goals: Cognitive deficits;Behavior      Swallow Study   General HPI: 56 year old male with prior history of cardiac arrest with anoxic brain injury who presented with acute hypoxic respiratory failure in the setting of bilateral lower lobe pneumonia requiring endotracheal intubation.  Pt was intubated from 10/25-10/28.  MBS in 2018 recommending Dysphagia 3 solids and honey-thick liquids. Type of Study: Bedside Swallow Evaluation Previous Swallow Assessment: MBS and BSE in 2018.  See HPI Diet Prior to this Study: NPO Temperature Spikes Noted: No Respiratory Status: Nasal cannula History of Recent Intubation: Yes Length of Intubations (days): 3 days Date extubated: 10/24/22 Behavior/Cognition: Alert;Confused;Impulsive;Distractible;Requires cueing Oral Cavity Assessment: Within Functional Limits Oral Care Completed by SLP: No Oral Cavity - Dentition: Missing dentition;Poor condition Self-Feeding Abilities: Needs assist Patient Positioning: Upright in bed Baseline Vocal Quality: Normal Volitional Cough: Weak;Congested Volitional Swallow: Able to elicit    Oral/Motor/Sensory Function Overall Oral Motor/Sensory Function: Other (comment) (Pt with difficulty following commands for OME)   Ice Chips Ice chips: Not tested   Thin Liquid Thin Liquid: Impaired Presentation: Straw Pharyngeal  Phase Impairments: Cough - Immediate    Nectar Thick Nectar Thick Liquid: Impaired Presentation: Straw Pharyngeal Phase Impairments: Cough -  Immediate   Honey Thick Honey Thick Liquid: Impaired Presentation: Straw Pharyngeal Phase Impairments: Cough - Immediate   Puree Puree: Impaired Presentation: Spoon Oral Phase Functional Implications: Prolonged oral transit   Solid     Solid: Not tested     Bretta Bang, M.S., [facility] Office: [phone]  Elvia Collum Elka Satterfield 10/25/2022,10:34 AM

## 2022-10-25 NOTE — Progress Notes (Signed)
NAME:  [name], MRN:  [id number], DOB:  11-Nov-1966, LOS: 4 ADMISSION DATE:  10/21/2022, CONSULTATION DATE:  10/25 REFERRING MD:  Theresia Lo, EDP CHIEF COMPLAINT:  Hypoxia   History of Present Illness:  56 year old man admitted from [facility] for hypoxia and unresponsiveness. Limited information available. May have aspirated. Hypoxic to 70's and agitated in ED so intubated.    Chronic SNF resident due to demential and anoxic brain injury, gait difficulties. May be bed bound.    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  He eventually recovered but had subsequent anoxic brain injury.  He has been institution bound since approximately 2007.  His sister states that he has had a significant functional decline in the past 2 years since their mother passed away.  Pertinent  Medical History   Past Medical History:  Diagnosis Date   Addison disease (HCC)    Anoxic brain injury (HCC)    TBI   Anxiety    Aspiration pneumonia (HCC)    Atrial fibrillation (HCC)    Chronic constipation 11/23/2011   Dementia (HCC)    Dysphagia    Encephalopathy    Frontal lobe syndrome    Gait abnormality    freq falls   Glaucoma    Glucocorticoid deficiency (HCC)    History of heart attack    History of recurrent UTIs    Hyperlipemia    Hypothyroidism    Mental disorder    Myocardial infarction (HCC)    Pneumoperitoneum of unknown etiology 12/21/2011   Quadriplegia (HCC)    Reflux    Seizure disorder (HCC)    Weakness of both legs      Significant Hospital Events: Including procedures, antibiotic start and stop dates in addition to other pertinent events   10/25 - admitted via ED. 10/27: A-fib overnight,  started amiodarone drip 10/28 abdominal U/S 6.7 x 6.8 cm hernia versus hematoma   Interim History / Subjective:  No acute events overnight He was extubated yesterday   Objective   Blood pressure 94/69, pulse (!) 55, temperature (!) 96.9 F (36.1 C), temperature source Axillary, resp. rate 18, height 5\' 8"  (1.727 m), weight 72 kg, SpO2 100 %.        Intake/Output Summary (Last 24 hours) at 10/25/2022 1039 Last data filed at 10/25/2022 0756 Gross per 24 hour  Intake 2646.47 ml  Output 575 ml  Net 2071.47 ml   Filed Weights   10/22/22 0500 10/23/22 0500 10/25/22 0430  Weight: 56.7 kg 61.3 kg 72 kg   Examination: General: Acutely ill and cachectic middle-age man. Remains fidgety. HENT: moist mucous membranes Lungs: mildly rhonchorous anterior lung sounds.  No WOB. Cardiovascular: RRR. No murmurs, rubs or gallops.   Abdomen: Soft.  NT/ND. Abdominal wall hernia. Extremities: Warm and dry. Neuro: Awake and alert. Grinding teeth. GU: Foley in place.  Resolved Hospital Problem list   AKI Respiratory acidosis  Assessment & Plan:  Septic shock 2/2 pneumonia HCAP Continues to improve clinically. Still requiring low-dose Levophed.  Leukocytosis stable. Remains afebrile.  Respiratory culture showing abundant gram-positive rods.  And rare yeast with pseudohyphae.  -Continue ceftriaxone and vancomycin -Continue IV LR -Wean Levophed for MAP goal >65 -  Continue stress dose steroids  Acute respiratory failure with hypoxia (HCC) Respiratory acidosis, resolved Extubated to 3 LNC.  Continues to have increased oral secretions -Continue O2 supplementation, wean for O2 sat >92% -Scopolamine patch to help with secretions  Hx of abdominal hernia Hx abdominal wall abscess Patient has a history of abdominal hernia status post placement of mesh.  He was found to have abdominal wall abscess and infected mesh which was removed back in 2019. Previous peg site wound culture Gram stain showing  gram-positive cocci -CT abdomen pelvis to further evaluate -on antibiotics as above   New onset A-fib Patient has remain in normal sinus in the last 48 hours.  No electrolyte abnormalities.  TSH within normal limits. -Continue IV heparin -Continue IV amiodarone for now -Telemetry -Replete electrolytes   Cardiomyopathy, ischemic Last TTE 09/2017 showed EF 60 to 65% and akinesis of the basal inferior myocardium. Repeat TTE showed EF 50 to 55%, with global hypokinesis but no valvular abnormalities. -Monitor and replete electrolytes -Strict I&O's  Hypothyroidism Repeat stable at TSH 5.5. -Continue Synthroid to 225 mcg daily  Hx adrenal insufficiency Normal a.m. cortisol. -Fludrocortisone 0.1 mg daily to help with BP  Protein calorie malnutrition (HCC) -Will likely need cortrak placed  Best Practice (right click and "Reselect all SmartList Selections" daily)   Diet/type: NPO w/ oral meds DVT prophylaxis: LMWH GI prophylaxis: PPI Lines: N/A Foley:  Yes, and it is still needed Code Status:  limited Last date of multidisciplinary goals of care discussion 10/28 [updated sister on the phone, who plans to visit patient tomorrow, Sunday.  Reports that patient was always fidgety and grinding his teeth while at the facility.  He was placed on a mat due to multiple falls from bed]  Labs   CBC: Recent Labs  Lab 10/21/22 1833 10/21/22 1926 10/21/22 2123 10/21/22 2129 10/22/22 0418 10/22/22 0454 10/23/22 0431 10/24/22 0600 10/25/22 0702  WBC 13.6*  --  10.9*  --   --  14.7* 11.2* 11.7* 11.2*  NEUTROABS 11.9*  --   --   --   --   --  8.3*  --   --   HGB 14.3   < > 11.5*   < > 11.6* 11.2* 9.8* 10.7* 10.1*  HCT 45.1   < > 37.2*   < > 34.0* 35.1* 30.4* 33.1* 31.4*  MCV 92.0  --  93.7  --   --  89.8 90.5 88.7 88.2  PLT 301  --  161  --   --  240 186 232 223   < > = values in this interval not displayed.    Basic Metabolic Panel: Recent Labs  Lab 10/21/22 1833 10/21/22 1926  10/21/22 2123 10/21/22 2129 10/22/22 0044 10/22/22 0418 10/22/22 0454 10/22/22 1614 10/22/22 2216 10/23/22 0431 10/24/22 0600  NA 142   < >  --    < > 142 142 141  --  142 139  --   K 4.2   < >  --    < > 4.3 4.4 4.3  --  4.6 3.8  --   CL 107  --   --   --   --   --  107  --  105 108  --   CO2 18*  --   --   --   --   --  22  --  23 24  --   GLUCOSE 129*  --   --   --   --   --  87  --  107* 113*  --   BUN 26*  --   --   --   --   --  24*  --  22* 18  --   CREATININE 1.41*  --  1.28*  --   --   --  1.05  --  0.88 0.76  --   CALCIUM 8.9  --   --   --   --   --  8.4*  --  8.6* 8.2*  --   MG  --    < > 1.7  --   --   --  1.7 1.6* 1.6* 1.7 2.2  PHOS  --   --  3.5  --   --   --  4.7* 3.8  --  3.1  --    < > = values in this interval not displayed.   GFR: Estimated Creatinine Clearance: 100.9 mL/min (by C-G formula based on SCr of 0.76 mg/dL). Recent Labs  Lab 10/21/22 1827 10/21/22 1833 10/21/22 2148 10/22/22 0454 10/22/22 0741 10/23/22 0431 10/24/22 0600 10/25/22 0702  WBC  --    < >  --  14.7*  --  11.2* 11.7* 11.2*  LATICACIDVEN 4.8*  --  3.0*  --  1.7  --   --   --    < > = values in this interval not displayed.    Liver Function Tests: Recent Labs  Lab 10/21/22 1833 10/23/22 0431  AST 19 11*  ALT 7 6  ALKPHOS 56 40  BILITOT 0.4 0.3  PROT 7.7 5.3*  ALBUMIN 2.5* 1.6*   No results for input(s): "LIPASE", "AMYLASE" in the last 168 hours. No results for input(s): "AMMONIA" in the last 168 hours.  ABG    Component Value Date/Time   PHART 7.36 10/23/2022 0535   PCO2ART 47 10/23/2022 0535   PO2ART 91 10/23/2022 0535   HCO3 26.6 10/23/2022 0535   TCO2 28 10/22/2022 0418   ACIDBASEDEF 4.0 (H) 10/22/2022 0044   O2SAT 100 10/23/2022 0535     Coagulation Profile: Recent Labs  Lab 10/21/22 1833  INR 1.1    Cardiac Enzymes: No results for input(s): "CKTOTAL", "CKMB", "CKMBINDEX", "TROPONINI" in the last 168 hours.  HbA1C: Hgb A1c MFr Bld  Date/Time Value  Ref Range Status  11/24/2011 05:15 AM 4.6 <5.7 % Final    Comment:    (NOTE)                                                                       According to the ADA Clinical Practice Recommendations for 2011, when HbA1c is used as a screening test:  >=6.5%   Diagnostic of Diabetes Mellitus           (if abnormal result is confirmed) 5.7-6.4%   Increased risk of developing Diabetes Mellitus References:Diagnosis and Classification of Diabetes Mellitus,Diabetes Care,2011,34(Suppl 1):S62-S69 and Standards of Medical Care in         Diabetes - 2011,Diabetes Care,2011,34 (Suppl 1):S11-S61.  10/05/2011 05:10 AM 5.0 <5.7 % Final    Comment:    (NOTE)  According to the ADA Clinical Practice Recommendations for 2011, when HbA1c is used as a screening test:  >=6.5%   Diagnostic of Diabetes Mellitus           (if abnormal result is confirmed) 5.7-6.4%   Increased risk of developing Diabetes Mellitus References:Diagnosis and Classification of Diabetes Mellitus,Diabetes PYPP,5093,26(ZTIWP 1):S62-S69 and Standards of Medical Care in         Diabetes - 2011,Diabetes YKDX,8338,25 (Suppl 1):S11-S61.    CBG: Recent Labs  Lab 10/24/22 2323 10/25/22 0325 10/25/22 0757 10/25/22 0800 10/25/22 0832  GLUCAP 90 78 63* 66* 118*    Review of Systems:   As in HPI  Past Medical History:  He,  has a past medical history of Addison disease ([place]), Anoxic brain injury ([place]), Anxiety, Aspiration pneumonia ([place]), Atrial fibrillation ([place]), Chronic constipation (11/23/2011), Dementia ([place]), Dysphagia, Encephalopathy, Frontal lobe syndrome, Gait abnormality, Glaucoma, Glucocorticoid deficiency ([place]), History of heart attack, History of recurrent UTIs, Hyperlipemia, Hypothyroidism, Mental disorder, Myocardial infarction ([place]), Pneumoperitoneum of unknown etiology (12/21/2011), Quadriplegia ([place]), Reflux, Seizure disorder ([place]), and Weakness  of both legs.   Surgical History:   Past Surgical History:  Procedure Laterality Date   DEBRIDEMENT OF ABDOMINAL WALL ABSCESS  01/26/2018   HERNIA MESH REMOVAL  01/26/2018   Infected mesh - removed w I&D abd wall abscess   IR GENERIC HISTORICAL  10/20/2016   IR [place] GASTRO/COLONIC TUBE PERCUT W/FLUORO 10/20/2016 Arne Cleveland, MD [facility]   IR REPLACE G-TUBE SIMPLE WO FLUORO  01/28/2018   IR [place] GASTRO/COLONIC TUBE PERCUT W/FLUORO  04/21/2017   IR REPLC GASTRO/COLONIC TUBE PERCUT W/FLUORO  07/22/2017   IR REPLC GASTRO/COLONIC TUBE PERCUT W/FLUORO  11/24/2017   IR REPLC GASTRO/COLONIC TUBE PERCUT W/FLUORO  08/12/2020   IRRIGATION AND DEBRIDEMENT ABSCESS N/A 01/26/2018   Procedure: IRRIGATION AND DEBRIDEMENT ABDOMINAL WALL ABSCESS WITH REMOVAL OF INFECTED MESH;  Surgeon: Michael Boston, MD;  Location: [facility];  Service: General;  Laterality: N/A;   NEPHRECTOMY  unknown   PEG TUBE PLACEMENT       Social History:   reports that he has never smoked. He has never used smokeless tobacco. He reports that he does not drink alcohol and does not use drugs.   Family History:  His family history is not on file.   Allergies Allergies  Allergen Reactions   Ativan [Lorazepam] Other (See Comments)    Tolerated 09/2022 admission   Latex Dermatitis and Other (See Comments)    "Allergic," per MAR   Morphine And Related Other (See Comments)    Reaction not listed on MAR   Other Other (See Comments)    Soy Sauce  Reaction not listed on MAR   Penicillins Other (See Comments)    Patient tolerated cephalosporins in past. "Allergic," per MAR.  Has patient had a PCN reaction causing immediate rash, facial/tongue/throat swelling, SOB or lightheadedness with hypotension: unknown Has patient had a PCN reaction causing severe rash involving mucus membranes or skin necrosis: unknown Has patient had a PCN reaction that required hospitalization: no Has patient had a PCN reaction occurring within the last  10 years: yes If all of the above answe   Pyridium [Phenazopyridine Hcl] Other (See Comments)    Reaction not listed on MAR   Soy Allergy Other (See Comments)    Reaction not listed on MAR   Soybean (Diagnostic) Other (See Comments)    Reaction not listed no MAR     Home Medications  Prior to Admission medications  Medication Sig Start Date End Date Taking? Authorizing Provider  acetaminophen (TYLENOL) 325 MG tablet Take 650 mg by mouth every 4 (four) hours as needed (pain).   Yes [provider]  albuterol (ACCUNEB) 0.63 MG/3ML nebulizer solution Take 1 ampule by nebulization every 6 (six) hours as needed for wheezing or shortness of breath (COPD).   Yes [provider]  aspirin 81 MG chewable tablet Chew 81 mg by mouth every morning. 0900   Yes [provider]  calcium carbonate (TUMS - DOSED IN MG ELEMENTAL CALCIUM) 500 MG chewable tablet Chew 1 tablet by mouth every 12 (twelve) hours as needed for indigestion or heartburn.   Yes [provider]  Carboxymeth-Glycerin-Polysorb (REFRESH OPTIVE ADVANCED) 0.5-1-0.5 % SOLN Place 1 drop into both eyes 3 (three) times daily. 0900, 1400, 2100   Yes [provider]  clonazePAM (KLONOPIN) 0.5 MG tablet Take 0.5 mg by mouth 2 (two) times daily. 0830 and 1630   Yes [provider]  divalproex (DEPAKOTE SPRINKLE) 125 MG capsule Take 500 mg by mouth 3 (three) times daily. 0830, 1230, and 2030 07/18/13  Yes [provider]  docusate sodium (COLACE) 100 MG capsule Take 100 mg by mouth every morning. 0800   Yes [provider]  haloperidol (HALDOL) 5 MG tablet Take 5 mg by mouth 2 (two) times daily. 0830 and 2030   Yes [provider]  levothyroxine (SYNTHROID) 200 MCG tablet Take 200 mcg by mouth daily before breakfast. Take with a 25 mcg tablet for a total dose of 225 mcg every morning 0630   Yes [provider]  levothyroxine (SYNTHROID) 25 MCG tablet Take 25 mcg  by mouth daily before breakfast. Give with current 200 mcg dose for 225 mcg 0630   Yes [provider]  Magnesium Hydroxide (MILK OF MAGNESIA PO) Take 30 mLs by mouth daily as needed (constipation).   Yes [provider]  omeprazole (PRILOSEC) 20 MG capsule Take 20 mg by mouth at bedtime.   Yes [provider]  polyethylene glycol (MIRALAX / GLYCOLAX) packet Take 17 g by mouth 2 (two) times daily. 0900 and 2100   Yes [provider]  senna (SENOKOT) 8.6 MG tablet Take 2 tablets by mouth 2 (two) times daily. 0900 and 2000   Yes [provider]  sertraline (ZOLOFT) 50 MG tablet Take 75 mg by mouth every morning. 0900   Yes [provider]  cephALEXin (KEFLEX) 500 MG capsule Take 1 capsule (500 mg total) by mouth 4 (four) times daily. Patient not taking: Reported on 05/24/2022 05/15/22   [name], MD  fosfomycin (MONUROL) 3 g PACK Take 3 g by mouth See admin instructions. Give 3 g packet by mouth one time a day for UTI for one day  0900    [provider]  pantoprazole (PROTONIX) 40 MG tablet Take 40 mg by mouth daily. 0700    [provider]  pantoprazole sodium (PROTONIX) 40 mg/20 mL PACK Take 40 mg by mouth every morning. Patient not taking: Reported on 05/24/2022 11/30/11   [name], MD  traMADol (ULTRAM) 50 MG tablet Take 50 mg by mouth every 6 (six) hours as needed (pain).    [provider]     Critical care time: 73    Melody Comas, MD [facility] Office: [phone]   See Amion for personal pager PCCM on call pager [phone] until 7pm. Please call Elink 7p-7a. [phone]

## 2022-10-26 ENCOUNTER — Inpatient Hospital Stay (HOSPITAL_COMMUNITY): Payer: 59

## 2022-10-26 DIAGNOSIS — J9601 Acute respiratory failure with hypoxia: Secondary | ICD-10-CM | POA: Diagnosis not present

## 2022-10-26 LAB — CBC
HCT: 35.6 % — ABNORMAL LOW (ref 39.0–52.0)
Hemoglobin: 11.6 g/dL — ABNORMAL LOW (ref 13.0–17.0)
MCH: 28.2 pg (ref 26.0–34.0)
MCHC: 32.6 g/dL (ref 30.0–36.0)
MCV: 86.4 fL (ref 80.0–100.0)
Platelets: 231 10*3/uL (ref 150–400)
RBC: 4.12 MIL/uL — ABNORMAL LOW (ref 4.22–5.81)
RDW: 13.2 % (ref 11.5–15.5)
WBC: 10.5 10*3/uL (ref 4.0–10.5)
nRBC: 0 % (ref 0.0–0.2)

## 2022-10-26 LAB — GLUCOSE, CAPILLARY
Glucose-Capillary: 104 mg/dL — ABNORMAL HIGH (ref 70–99)
Glucose-Capillary: 104 mg/dL — ABNORMAL HIGH (ref 70–99)
Glucose-Capillary: 123 mg/dL — ABNORMAL HIGH (ref 70–99)
Glucose-Capillary: 80 mg/dL (ref 70–99)
Glucose-Capillary: 87 mg/dL (ref 70–99)
Glucose-Capillary: 95 mg/dL (ref 70–99)

## 2022-10-26 LAB — CULTURE, BLOOD (ROUTINE X 2)
Culture: NO GROWTH
Culture: NO GROWTH
Special Requests: ADEQUATE
Special Requests: ADEQUATE

## 2022-10-26 LAB — BASIC METABOLIC PANEL
Anion gap: 11 (ref 5–15)
BUN: 10 mg/dL (ref 6–20)
CO2: 22 mmol/L (ref 22–32)
Calcium: 8.1 mg/dL — ABNORMAL LOW (ref 8.9–10.3)
Chloride: 103 mmol/L (ref 98–111)
Creatinine, Ser: 0.68 mg/dL (ref 0.61–1.24)
GFR, Estimated: >60 mL/min (ref >60)
Glucose, Bld: 90 mg/dL (ref 70–99)
Potassium: 4.2 mmol/L (ref 3.5–5.1)
Sodium: 136 mmol/L (ref 135–145)

## 2022-10-26 LAB — AEROBIC CULTURE W GRAM STAIN (SUPERFICIAL SPECIMEN)

## 2022-10-26 LAB — VANCOMYCIN, PEAK: Vancomycin Pk: 29 ug/mL — ABNORMAL LOW (ref 30–40)

## 2022-10-26 MED ORDER — OSMOLITE 1.5 CAL PO LIQD
1000.0000 mL | ORAL | Status: DC
  Administered 2022-10-26 – 2022-10-31 (×6): 1000 mL
  Filled 2022-10-26 (×9): qty 1000

## 2022-10-26 MED ORDER — VALPROIC ACID 250 MG/5ML PO SOLN
500.0000 mg | Freq: Three times a day (TID) | ORAL | Status: DC
  Administered 2022-10-26 – 2022-11-07 (×36): 500 mg
  Filled 2022-10-26 (×38): qty 10

## 2022-10-26 MED ORDER — FLUDROCORTISONE ACETATE 0.1 MG PO TABS
0.1000 mg | ORAL_TABLET | Freq: Every day | ORAL | Status: DC
  Administered 2022-10-26 – 2022-10-27 (×2): 0.1 mg
  Filled 2022-10-26 (×2): qty 1

## 2022-10-26 MED ORDER — FLUDROCORTISONE ACETATE 0.1 MG PO TABS: 0.1000 mg | ORAL_TABLET | Freq: Every day | ORAL | Status: DC

## 2022-10-26 MED ORDER — LEVOTHYROXINE SODIUM 75 MCG PO TABS
225.0000 ug | ORAL_TABLET | Freq: Every day | ORAL | Status: DC
  Administered 2022-10-27 – 2022-11-07 (×11): 225 ug
  Filled 2022-10-26: qty 1
  Filled 2022-10-26: qty 3
  Filled 2022-10-26 (×2): qty 1
  Filled 2022-10-26 (×2): qty 3
  Filled 2022-10-26: qty 1
  Filled 2022-10-26: qty 3
  Filled 2022-10-26 (×4): qty 1

## 2022-10-26 MED ORDER — BISACODYL 10 MG RE SUPP
10.0000 mg | Freq: Once | RECTAL | Status: AC
  Administered 2022-10-26: 10 mg via RECTAL
  Filled 2022-10-26: qty 1

## 2022-10-26 MED ORDER — SERTRALINE HCL 50 MG PO TABS
75.0000 mg | ORAL_TABLET | Freq: Every day | ORAL | Status: DC
  Administered 2022-10-26 – 2022-11-07 (×13): 75 mg
  Filled 2022-10-26 (×12): qty 2

## 2022-10-26 MED ORDER — CLONAZEPAM 0.5 MG PO TABS: 0.5000 mg | ORAL_TABLET | Freq: Two times a day (BID) | ORAL | Status: DC

## 2022-10-26 MED ORDER — LIDOCAINE-EPINEPHRINE 0.5 %-1:200000 IJ SOLN
30.0000 mL | Freq: Once | INTRAMUSCULAR | Status: AC
  Administered 2022-10-26: 30 mL
  Filled 2022-10-26 (×2): qty 30

## 2022-10-26 MED ORDER — ONDANSETRON HCL 4 MG PO TABS
4.0000 mg | ORAL_TABLET | Freq: Four times a day (QID) | ORAL | Status: DC | PRN
  Administered 2022-10-27 – 2022-10-31 (×6): 4 mg
  Filled 2022-10-26 (×5): qty 1

## 2022-10-26 MED ORDER — SENNOSIDES-DOCUSATE SODIUM 8.6-50 MG PO TABS
1.0000 | ORAL_TABLET | Freq: Two times a day (BID) | ORAL | Status: DC
  Administered 2022-10-26 – 2022-10-27 (×2): 1
  Filled 2022-10-26: qty 1

## 2022-10-26 MED ORDER — CLONAZEPAM 0.5 MG PO TABS
0.5000 mg | ORAL_TABLET | Freq: Two times a day (BID) | ORAL | Status: DC
  Administered 2022-10-26 – 2022-11-08 (×26): 0.5 mg
  Filled 2022-10-26 (×27): qty 1

## 2022-10-26 MED ORDER — HALOPERIDOL 5 MG PO TABS
5.0000 mg | ORAL_TABLET | Freq: Every evening | ORAL | Status: DC
  Administered 2022-10-26 – 2022-11-06 (×12): 5 mg
  Filled 2022-10-26 (×13): qty 1

## 2022-10-26 MED ORDER — PROSOURCE TF20 ENFIT COMPATIBL EN LIQD
60.0000 mL | Freq: Every day | ENTERAL | Status: DC
  Administered 2022-10-26 – 2022-11-07 (×13): 60 mL
  Filled 2022-10-26 (×13): qty 60

## 2022-10-26 MED ORDER — POLYETHYLENE GLYCOL 3350 17 G PO PACK: 17.0000 g | PACK | Freq: Every day | ORAL | Status: DC

## 2022-10-26 NOTE — Progress Notes (Signed)
SLP Cancellation Note  Patient Details Name: [name] MRN: [id number] DOB: 19-Sep-1966   Cancelled treatment:        Unable to see for po trials due to possibly having surgery today. Will continue efforts.    Houston Siren 10/26/2022, 9:10 AM Orbie Pyo Colvin Caroli.Ed Product/process development scientist [phone]

## 2022-10-26 NOTE — Progress Notes (Signed)
[facility] Progress Note Patient Name: [name] DOB: [birth year] MRN: [id number]   Date of Service  10/26/2022  HPI/Events of Note  Phlebotomy asking for foot stick order for labs. Unable to find anything on upper extremities.  eICU Interventions  Ok to get it, under sterile technique.     Intervention Category Minor Interventions: Other:  Elmer Sow 10/26/2022, 3:21 AM

## 2022-10-26 NOTE — Progress Notes (Signed)
Patient ID: [name], male   DOB: 19-Nov-1966, 56 y.o.   MRN: [id number]      Subjective: sedated ROS negative except as listed above. Objective: Vital signs in last 24 hours: Temp:  [97.1 F (36.2 C)-98.2 F (36.8 C)] 97.6 F (36.4 C) (10/30 0751) Pulse Rate:  [39-81] 41 (10/30 0745) Resp:  [12-32] 19 (10/30 0745) BP: (48-178)/(24-155) 138/76 (10/30 0745) SpO2:  [92 %-100 %] 99 % (10/30 0745) Weight:  [68.4 kg] 68.4 kg (10/30 0500) Last BM Date : 10/25/22  Intake/Output from previous day: 10/29 0701 - 10/30 0700 In: 1480.4 [I.V.:1174; IV Piggyback:306.5] Out: 1060 [Urine:1060] Intake/Output this shift: No intake/output data recorded.  General appearance: no distress Resp: clear to auscultation bilaterally GI: large R abdominal wall fluctuant mass with some erythema  Lab Results: CBC  Recent Labs    10/25/22 0702 10/25/22 2241 10/26/22 0628  WBC 11.2*  --  10.5  HGB 10.1* 8.8* 11.6*  HCT 31.4* 26.0* 35.6*  PLT 223  --  231   BMET Recent Labs    10/25/22 2241  NA 131*  K 4.0   PT/INR No results for input(s): "LABPROT", "INR" in the last 72 hours. ABG Recent Labs    10/25/22 2241  HCO3 24.6    Studies/Results: CT ABDOMEN PELVIS W CONTRAST  Result Date: 10/24/2022 CLINICAL DATA:  Right lower quadrant abdominal pain. Abdominal hernia. EXAM: CT ABDOMEN AND PELVIS WITH CONTRAST TECHNIQUE: Multidetector CT imaging of the abdomen and pelvis was performed using the standard protocol following bolus administration of intravenous contrast. RADIATION DOSE REDUCTION: This exam was performed according to the departmental dose-optimization program which includes automated exposure control, adjustment of the mA and/or kV according to patient size and/or use of iterative reconstruction technique. CONTRAST:  31mL OMNIPAQUE IOHEXOL 350 MG/ML SOLN COMPARISON:  07/28/2021 FINDINGS: Lower chest: Elevated right hemidiaphragm. New small bilateral pleural effusions with  passive atelectasis. Left anterior descending coronary atherosclerosis and descending thoracic aortic atherosclerosis. Substantial patchy nodular ground-glass and airspace opacities in the right upper lobe with some hazy ground-glass opacity in the left upper lobe, considerable atelectasis in the left lower lobe and right lower lobe, likely some degree of consolidation particularly in the right lower lobe. Patchy ground-glass densities in the lingula. There is some narrowing of the right mainstem bronchus at least partially due to dependent mucus within the airway, and also substantial narrowing of the bronchus intermedius and particularly right lower lobe tracheobronchial tree. Right lower lobe air bronchograms noted with truncation of the right lower lobe bronchus compatible with airway plugging or occlusion. Right greater than left airway thickening noted. There is notable asymmetric edema in the subcutaneous tissues of the right lower chest wall spanning down towards the abdomen. Hepatobiliary: Pancreas: Unremarkable Spleen: Unremarkable Adrenals/Urinary Tract: Adrenal glands normal. Prior right nephrectomy. 2.7 by 1.8 cm hypodense exophytic lesion from the left kidney upper pole with internal density 28 Hounsfield units. A 1.3 cm lesion of the left mid kidney laterally on image 63 series 9 has an internal density of 25 Hounsfield units. Both are homogeneous and considered Bosniak category 2 cysts, and in not require further workup. A Bosniak category 1 cyst of the left kidney upper pole is also present. This does not require further workup. A Foley catheter is present in the urinary bladder. Right retroperitoneal edema as on image 54 series 3, particularly along the site of the prior nephrectomy site. Stomach/Bowel: Diffuse mild gastric wall thickening, cannot exclude gastritis. Right lower rib lateral osteotomies noted  with a right lumbar hernia, with laxity of the abdominal wall allowing herniation of small  and large bowel, but no strangulation or complicating feature. This hernia has a wide neck. There are a few sigmoid colon diverticula.  No dilated bowel. Vascular/Lymphatic: Mild abdominal aortic atherosclerosis. No pathologic adenopathy. Reproductive: Unremarkable Other: Diffuse subcutaneous edema noted along the abdomen but especially along the right lateral abdominal wall. Small but abnormal amount of pelvic ascites. Mild diffuse mesenteric edema. Musculoskeletal: Enlarging abnormal fluid collection of the right rectus abdominus musculature no extends substantially into the subcutaneous tissues, measuring about 10.7 by 8.6 by 7.5 cm (volume = 360 cm^3). Surrounding inflammatory stranding in this region, tracking along the abdominal wall musculature cephalad and in the adjacent subcutaneous tissues. Right lumbar hernia with lax or torn lateral abdominal wall musculature and prior right lower rib osteotomies. Old bilateral rib fractures appear healed. Old left transverse process fractures in the lumbar spine at the L2, L3, and L4 levels. 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 There is foraminal impingement at all lumbar levels bilaterally. IMPRESSION: 1. Enlarging abnormal fluid collection of the right rectus abdominus musculature extending substantially into the subcutaneous tissues. This chronic collection could be an abscess or hematoma given the degree of surrounding inflammatory stranding in this region, tracking along the abdominal wall musculature cephalad and in the adjacent subcutaneous tissues. 2. New small bilateral pleural effusions with passive atelectasis. Elevated right hemidiaphragm. 3. Substantial patchy ground-glass and nodular airspace opacities in the right upper lobe with some hazy ground-glass opacity in the left upper lobe, favoring bilateral pneumonia. 4. Right lower lobe air bronchograms with truncation of the right lower lobe  bronchus compatible with airway plugging or occlusion. There is also substantial narrowing of the right bronchus intermedius and right lower lobe tracheobronchial tree. 5. Diffuse gastric wall thickening, cannot exclude gastritis. 6. Diffuse subcutaneous edema along the abdomen but especially along the right lateral abdominal wall. Small but abnormal amount of pelvic ascites. 7. Right lumbar hernia with laxity or tear of the abdominal wall allowing herniation of small and large bowel, but no strangulation or complicating feature. Prior right lower rib osteotomies noted. 8. Prior right nephrectomy. There is some edema in the right retroperitoneum along the site of the prior nephrectomy site. 9. Bosniak category 2 cysts of the left kidney upper pole and mid kidney, no follow up needed. 10. Aortic atherosclerosis. 11. Multilevel lumbar impingement. Aortic Atherosclerosis (ICD10-I70.0). Electronically Signed   By: Gaylyn Rong M.D.   On: 10/24/2022 13:18    Anti-infectives: Anti-infectives (From admission, onward)    Start     Dose/Rate Route Frequency Ordered Stop   10/24/22 2200  vancomycin (VANCOCIN) IVPB 1000 mg/200 mL premix        1,000 mg 200 mL/hr over 60 Minutes Intravenous Every 12 hours 10/24/22 1138     10/24/22 1230  vancomycin (VANCOREADY) IVPB 1250 mg/250 mL        1,250 mg 166.7 mL/hr over 90 Minutes Intravenous  Once 10/24/22 1138 10/24/22 1431   10/23/22 1130  cefTRIAXone (ROCEPHIN) 2 g in sodium chloride 0.9 % 100 mL IVPB        2 g 200 mL/hr over 30 Minutes Intravenous Every 24 hours 10/23/22 1041 10/27/22 1129   10/22/22 2200  ceFEPIme (MAXIPIME) 2 g in sodium chloride 0.9 % 100 mL IVPB  Status:  Discontinued        2 g 200 mL/hr over 30 Minutes Intravenous Every  8 hours 10/22/22 1559 10/23/22 1041   10/22/22 0900  vancomycin (VANCOREADY) IVPB 750 mg/150 mL  Status:  Discontinued        750 mg 150 mL/hr over 60 Minutes Intravenous Every 12 hours 10/21/22 2041 10/22/22 1559    10/22/22 0700  ceFEPIme (MAXIPIME) 2 g in sodium chloride 0.9 % 100 mL IVPB  Status:  Discontinued        2 g 200 mL/hr over 30 Minutes Intravenous Every 12 hours 10/21/22 2041 10/22/22 1559   10/21/22 1900  vancomycin (VANCOREADY) IVPB 1750 mg/350 mL        1,750 mg 175 mL/hr over 120 Minutes Intravenous  Once 10/21/22 1854 10/21/22 2125   10/21/22 1900  ceFEPIme (MAXIPIME) 2 g in sodium chloride 0.9 % 100 mL IVPB        2 g 200 mL/hr over 30 Minutes Intravenous  Once 10/21/22 1854 10/21/22 1924   10/21/22 1900  azithromycin (ZITHROMAX) 500 mg in sodium chloride 0.9 % 250 mL IVPB  Status:  Discontinued        500 mg 250 mL/hr over 60 Minutes Intravenous Every 24 hours 10/21/22 1854 10/23/22 1041   10/21/22 1845  vancomycin (VANCOCIN) IVPB 1000 mg/200 mL premix  Status:  Discontinued        1,000 mg 200 mL/hr over 60 Minutes Intravenous  Once 10/21/22 1835 10/21/22 1851   10/21/22 1845  levofloxacin (LEVAQUIN) IVPB 750 mg  Status:  Discontinued        750 mg 100 mL/hr over 90 Minutes Intravenous  Once 10/21/22 1835 10/21/22 1849       Assessment/Plan: Right abdominal wall abscess - for I&D at the bedside. I will call his sister and send CXs History of infected mesh Right lumbar hernia Pneumonia Acute respiratory failure - extubated 10/28 FEN - OK for Cortrak and tube feeds Bradycardia - per CCM Sepsis A fib H/o adrenal insufficiency.    LOS: 5 days    Violeta Gelinas, MD, MPH, FACS Trauma & General Surgery Use [email or web address] to contact on call provider  10/26/2022

## 2022-10-26 NOTE — Progress Notes (Signed)
Patient ID: Desi Rowe, male   DOB: 03-24-1966, 56 y.o.   MRN: [id number] I called his legal guardian, [name], and described the planned bedside I&D of abdominal wall abscess including the procedure, risks, and benefits. She agrees. Consent documented.  Georganna Skeans, MD, MPH, FACS Please use [email or web address] to contact on call provider

## 2022-10-26 NOTE — Progress Notes (Signed)
Nutrition Follow-up  DOCUMENTATION CODES:  Non-severe (moderate) malnutrition in context of social or environmental circumstances  INTERVENTION:  Resume tube feeding via cortrak: Osmolite 1.5 at 50 ml/h (1200 ml per day) Start at 30 and advance by 10 q4h to goal of 50 Prosource TF20 60 ml 1x/d Provides 1880 kcal, 95 gm protein, 914 ml free water daily Request new measured wt, variable hx  NUTRITION DIAGNOSIS:   Moderate Malnutrition related to social / environmental circumstances (dysphagia, SNF resident, ABI) as evidenced by moderate fat depletion, moderate muscle depletion.  GOAL:   Patient will meet greater than or equal to 90% of their needs  MONITOR:   Vent status, Weight trends, Labs, TF tolerance  REASON FOR ASSESSMENT:   Consult Enteral/tube feeding initiation and management  ASSESSMENT:   Pt with hx of ABI s/p cardiac arrest, HLD, GERD, cardiomyopathy, and possible quadriplegia presented to ED from SNF with worsening respiratory status. Found to be septic due to pneumonia. PEG at baseline and A&O x 2.   10/25 - Intubated 10/26 - TF started via OGT 10/28 - Extubated 10/30 - Cortrak placed, bedside I&D of abdominal abscess  Pt able to be extubated over the weekend.   Of note, ED physician reported on admission that facility relayed to her that pt had a PEG tube. Discussed with RN on Friday as pt was attempting to be extubated and pt was being fed through his OGT. Inspected abdomen and no PEG present, but pt did have a bandage over a draining wound. Attempted to call facility 10/26 and 10/27 to get clarification on if pt had a PEG and if so when it was removed but no answer at facility. Discussed with RN and attending this AM.   Pt underwent swallowing evaluation yesterday with the recommendation for continuation of NPO with essential oral medication administered crushed in puree. Was not able to safely recommend a diet until MBS could be performed. Pt to have cortrak  placed today for TF and medication administration. Per surgery, ok to begin feeds today. I&D planned for today for large abdominal abscess.    Intake/Output Summary (Last 24 hours) at 10/26/2022 1046 Last data filed at 10/26/2022 0900 Gross per 24 hour  Intake 1525.42 ml  Output 935 ml  Net 590.42 ml  Net IO Since Admission: 14,739.57 mL [10/26/22 1046]  Nutritionally Relevant Medications: Scheduled Meds:  insulin aspart  1-3 Units Subcutaneous Q4H   polyethylene glycol  17 g Oral Daily   senna-docusate  1 tablet Oral BID   sertraline  75 mg Oral Daily   Continuous Infusions:  norepinephrine (LEVOPHED) Adult infusion 2 mcg/min (10/26/22 0900)   vancomycin 1,000 mg (10/26/22 1040)   PRN Meds: ondansetron   Labs Reviewed  NUTRITION - FOCUSED PHYSICAL EXAM: Flowsheet Row Most Recent Value  Orbital Region Moderate depletion  Upper Arm Region Mild depletion  Thoracic and Lumbar Region Mild depletion  Buccal Region Moderate depletion  Temple Region Moderate depletion  Clavicle Bone Region Moderate depletion  Clavicle and Acromion Bone Region Moderate depletion  Scapular Bone Region Unable to assess  Dorsal Hand Mild depletion  Patellar Region Severe depletion  Anterior Thigh Region Severe depletion  Posterior Calf Region Severe depletion  Edema (RD Assessment) None  Hair Reviewed  Eyes Unable to assess  Mouth Reviewed  Skin Reviewed  Nails Reviewed    Diet Order:   Diet Order             Diet NPO time specified  Diet effective  now                   EDUCATION NEEDS:  Not appropriate for education at this time  Skin:  Skin Assessment: Reviewed RN Assessment (Stage 1 buttocks)  Last BM:  10/29 - type 6  Height:  Ht Readings from Last 1 Encounters:  10/22/22 5\' 8"  (1.727 m)   Weight:  Wt Readings from Last 1 Encounters:  10/26/22 68.4 kg   Ideal Body Weight:  70 kg  BMI:  Body mass index is 22.93 kg/m.  Estimated Nutritional Needs:  Kcal:   1800-2000 kcal/d Protein:  85-100g/d Fluid:  1.8-2L/d    Ranell Patrick, RD, LDN Clinical Dietitian RD pager # available in AMION  After hours/weekend pager # available in [facility]

## 2022-10-26 NOTE — Procedures (Signed)
Cortrak  Person Inserting Tube:  Senica Crall L, RD Tube Type:  Cortrak - 43 inches Tube Size:  10 Tube Location:  Left nare Secured by: Bridle Technique Used to Measure Tube Placement:  Marking at nare/corner of mouth Cortrak Secured At:  66 cm   Cortrak Tube Team Note:  Consult received to place a Cortrak feeding tube.   X-ray is required, abdominal x-ray has been ordered by the Cortrak team. Please confirm tube placement before using the Cortrak tube.   If the tube becomes dislodged please keep the tube and contact the Cortrak team at [email or web address] for replacement.  If after hours and replacement cannot be delayed, place a NG tube and confirm placement with an abdominal x-ray.    Lebert Lovern RD, LDN Clinical Dietitian See AMiON for contact information.    

## 2022-10-26 NOTE — Evaluation (Signed)
Occupational Therapy Evaluation Patient Details Name: [name] MRN: [id number] DOB: [birth year] Today's Date: 10/26/2022   History of Present Illness Pt is a 56 y.o. male who presented 10/21/22 with acute hypoxic respiratory failure in the setting of bilateral lower lobe pneumonia. ETT 10/25-10/28. Found to have large abscess on the R abdominal wall and a moderate to large right lumbar hernia that was not incarcerated, may plan for surgery 10/30. PMH: cardiac arrest with anoxic brain injury, Addison disease, afib, dementia, dysphagia, UTIs, HLD, hypothyroidism, seizure   Clinical Impression   Patient admitted from SNF for above, limited by problem list below including impaired cognition, weakness, decreased activity tolerance, balance and lethargy.  Pt lethargic today but opens eyes to name briefly, before closing again.  He follows simple commands with delay, but poor attention, awareness.  Limited verbalizations today, nods head yes/no but inconsistent.  Resistant to OOB mobility (after nodding head yes) pushing into extension with +2 assist (from RN).  Pt requires max-total assist +2 for bed mobility, max asisst to wash face given hand over hand assist and total assist for all other Adls. Pt on [place] at 2L with VSS. Based on performance today, will follow acutely for OT and recommend further OT service acutely to return to PLOF.       Recommendations for follow up therapy are one component of a multi-disciplinary discharge planning process, led by the attending physician.  Recommendations may be updated based on patient status, additional functional criteria and insurance authorization.   Follow Up Recommendations  Skilled nursing-short term rehab (<3 hours/day)    Assistance Recommended at Discharge Frequent or constant Supervision/Assistance  Patient can return home with the following Two people to help with walking and/or transfers;Two people to help with  bathing/dressing/bathroom;Assistance with feeding;Assistance with cooking/housework;Direct supervision/assist for medications management;Assist for transportation;Direct supervision/assist for financial management;Help with stairs or ramp for entrance    Functional Status Assessment  Patient has had a recent decline in their functional status and demonstrates the ability to make significant improvements in function in a reasonable and predictable amount of time.  Equipment Recommendations  None recommended by OT    Recommendations for Other Services       Precautions / Restrictions Precautions Precautions: Fall Precaution Comments: posey; bil mittens; watch BP and SpO2 Restrictions Weight Bearing Restrictions: No      Mobility Bed Mobility Overal bed mobility: Needs Assistance Bed Mobility: Rolling, Supine to Sit Rolling: Max assist   Supine to sit: Total assist, +2 for physical assistance, +2 for safety/equipment, HOB elevated     General bed mobility comments: attempted transition to EOB today but patient resistant to sitting up with extension once partially upright, repositioned in bed with total asssist +2    Transfers                   General transfer comment: deferred d/t safety      Balance                                           ADL either performed or assessed with clinical judgement   ADL Overall ADL's : Needs assistance/impaired     Grooming: Total assistance;Bed level;Wash/dry face           Upper Body Dressing : Total assistance;Bed level   Lower Body Dressing: Total assistance;+2 for physical assistance;+2 for safety/equipment;Bed level  Toilet Transfer Details (indicate cue type and reason): deferred         Functional mobility during ADLs: Total assistance;+2 for physical assistance;+2 for safety/equipment General ADL Comments: pt session limited to bed level, resistant to sitting EOB today     Vision    Vision Assessment?: Vision impaired- to be further tested in functional context Additional Comments: further assessment required, opens eyes to name with upward gaze and able to find therapist with increased time but then closes eyes again.     Perception     Praxis      Pertinent Vitals/Pain Pain Assessment Pain Assessment: Faces Faces Pain Scale: Hurts a little bit Pain Location: generalized Pain Descriptors / Indicators: Grimacing Pain Intervention(s): Limited activity within patient's tolerance, Monitored during session, Repositioned     Hand Dominance Right   Extremity/Trunk Assessment Upper Extremity Assessment Upper Extremity Assessment: Generalized weakness (able to squeeze hands and raise arms minimally, L appears stronger than R)   Lower Extremity Assessment Lower Extremity Assessment: Defer to PT evaluation       Communication Communication Communication: Expressive difficulties;Receptive difficulties   Cognition Arousal/Alertness: Lethargic Behavior During Therapy: Restless, Flat affect Overall Cognitive Status: History of cognitive impairments - at baseline                                 General Comments: RN reports impulsive overnight, pulling out his foley with his feet.  Pt with 4 point restraints and posey belt in place, L LE over side of bed.  Pt with delayed reponses very inconsistently, following simple cueing with increased time.  Nods head yes/no, but inconsistent.  Lethargic but opens eyes to name.     General Comments  VSS on 2L [place]    Exercises     Shoulder Instructions      Home Living Family/patient expects to be discharged to:: Skilled nursing facility                                 Additional Comments: Per PTs discussion with sister via phone call, pt has primarily been remaining in bed or on the bed floor mats as he can be impulsive to stand and fall often, thus nursing does not usually get him up for his  safety      Prior Functioning/Environment Prior Level of Function : Needs assist             Mobility Comments: Pt able to stand with assistance at SNF. Sister was unsure when he last ambulated with staff or PT ADLs Comments: anticipate needing assist, pt unable to verbalize today        OT Problem List: Decreased strength;Decreased range of motion;Decreased activity tolerance;Impaired balance (sitting and/or standing);Impaired vision/perception;Decreased coordination;Decreased cognition;Decreased safety awareness;Decreased knowledge of use of DME or AE;Decreased knowledge of precautions;Pain;Increased edema      OT Treatment/Interventions: Self-care/ADL training;Therapeutic exercise;DME and/or AE instruction;Therapeutic activities;Cognitive remediation/compensation;Patient/family education;Balance training    OT Goals(Current goals can be found in the care plan section) Acute Rehab OT Goals Patient Stated Goal: unable OT Goal Formulation: Patient unable to participate in goal setting Time For Goal Achievement: 11/09/22 Potential to Achieve Goals: Fair  OT Frequency: Min 2X/week    Co-evaluation              AM-PAC OT "6 Clicks" Daily Activity     Outcome Measure  Help from another person eating meals?: Total Help from another person taking care of personal grooming?: Total Help from another person toileting, which includes using toliet, bedpan, or urinal?: Total Help from another person bathing (including washing, rinsing, drying)?: Total Help from another person to put on and taking off regular upper body clothing?: Total Help from another person to put on and taking off regular lower body clothing?: Total 6 Click Score: 6   End of Session Equipment Utilized During Treatment: Oxygen Nurse Communication: Mobility status  Activity Tolerance: Patient limited by lethargy Patient left: in bed;with call bell/phone within reach;with bed alarm set;with nursing/sitter in  room;with restraints reapplied  OT Visit Diagnosis: Other abnormalities of gait and mobility (R26.89);Muscle weakness (generalized) (M62.81);Pain;Other symptoms and signs involving cognitive function Pain - part of body:  (generalized)                Time: 3382-5053 OT Time Calculation (min): 23 min Charges:  OT General Charges $OT Visit: 1 Visit OT Evaluation $OT Eval Moderate Complexity: 1 Mod OT Treatments $Self Care/Home Management : 8-22 mins  Barry Brunner, OT Acute Rehabilitation Services Office [phone]   Chancy Milroy 10/26/2022, 10:19 AM

## 2022-10-26 NOTE — Procedures (Signed)
Incision and Drainage Procedure Note  Pre-operative Diagnosis: Abdominal wall abscess  Post-operative Diagnosis: same  Indications: abscess  Anesthesia: 1% lidocaine with epinephrine  Procedure Details  The procedure, risks and complications have been discussed in detail (including, but not limited to pain, infection, bleeding, need for repeat procedure) with the patient's legal guardian, [name], and she has given consent for the procedure.  The skin was sterilely prepped and draped over the affected area in the usual fashion. After adequate local anesthesia, I&D with a #11 blade was performed on the abdominal wall abscess. Copious purulent drainage present. Wound irrigated with saline. Packed with 1/4 inch iodoform. Dry dressing applied. The patient was observed until stable.  Findings: abscess  EBL: <5 cc's  Drains: none  Condition: Tolerated procedure well and Stable   Complications: none.  Wellington Hampshire, [place] [facility] 10/26/2022, 1:55 PM Please see Amion for pager number during day hours 7:00am-4:30pm

## 2022-10-26 NOTE — Progress Notes (Signed)
NAME:  [name], MRN:  [id number], DOB:  [birth year], LOS: 5 ADMISSION DATE:  10/21/2022, CONSULTATION DATE:  10/25 REFERRING MD:  Theresia Lo, EDP CHIEF COMPLAINT:  Hypoxia   History of Present Illness:  56 year old man admitted from [facility] for hypoxia and unresponsiveness. Limited information available. May have aspirated. Hypoxic to 70's and agitated in ED so intubated.    Chronic SNF resident due to demential and anoxic brain injury, gait difficulties. May be bed bound.    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  He eventually recovered but had subsequent anoxic brain injury.  He has been institution bound since approximately 2007.  His sister states that he has had a significant functional decline in the past 2 years since their mother passed away.  Pertinent  Medical History   Past Medical History:  Diagnosis Date   Addison disease (HCC)    Anoxic brain injury (HCC)    TBI   Anxiety    Aspiration pneumonia (HCC)    Atrial fibrillation (HCC)    Chronic constipation 11/23/2011   Dementia (HCC)    Dysphagia    Encephalopathy    Frontal lobe syndrome    Gait abnormality    freq falls   Glaucoma    Glucocorticoid deficiency (HCC)    History of heart attack    History of recurrent UTIs    Hyperlipemia    Hypothyroidism    Mental disorder    Myocardial infarction (HCC)    Pneumoperitoneum of unknown etiology 12/21/2011   Quadriplegia (HCC)    Reflux    Seizure disorder (HCC)    Weakness of both legs     Significant Hospital Events: Including procedures, antibiotic start and stop dates in addition to other pertinent events   10/25 - admitted via ED. 10/27: A-fib overnight,  started amiodarone drip 10/28: Extubated to [place] 10/28: Abd U/S 6.7 x 6.8 cm hernia versus hematoma 10/28: CT A/P Enlarging 10.7 x 8.6 x 7.5 cm fluid collection  Interim History / Subjective:  Hypoglycemic episode to 56 yesterday No acute events overnight  Objective   Blood pressure 129/87, pulse (!) 43, temperature 97.6 F (36.4 C), temperature source Axillary, resp. rate 19, height 5\' 8"  (1.727 m), weight 68.4 kg, SpO2 97 %.        Intake/Output Summary (Last 24 hours) at 10/26/2022 1239 Last data filed at 10/26/2022 0900 Gross per 24 hour  Intake 1525.42 ml  Output 935 ml  Net 590.42 ml   Filed Weights   10/23/22 0500 10/25/22 0430 10/26/22 0500  Weight: 61.3 kg 72 kg 68.4 kg   Examination: General: Chronically ill, cachectic middle-age man laying in bed. HENT: moist mucous membranes Lungs: On 2 L [place]. Anterior lung sounds clear to auscultation.  No WOB. Cardiovascular: Bradycardic.  Regular rhythm.  No murmurs, rubs or gallops.   Abdomen: Soft.  NT/ND. Abdominal wall hernia with large R abdominal wall fluctuant mass with surrounding erythema and warmth. Extremities: Warm and dry. Neuro: Anxious appearing.  Alert and awake. GU: Condom cath in place  WBC 10.5, hgb 11.6, plt 231 CBGs 80-100s  Resolved Hospital Problem list   AKI Respiratory acidosis Afib  Assessment & Plan:  Septic shock 2/2 pneumonia HCAP Continues to do well from respiratory standpoint. Currently stable on 2 L [place]. On day 6 of antibiotics.  Remains afebrile, leukocytosis have now resolved.  BP remains soft we will plan on weaning off Levophed today. -Continue ceftriaxone and vancomycin -Wean Levophed for MAP goal >65 -Continue florinef  Acute respiratory failure with hypoxia (HCC) Respiratory acidosis, resolved Extubated to 3 LNC on 10/28.  Weaned down to 2 L.  Respiratory status continues to be stable. -Continue O2 supplementation, wean for O2 sat >92% -Scopolamine patch to help with  secretions  R abdominal wall abscess Hx of abdominal hernia CT A/P showed enlarging 10.7 x 8.6 x 7.5 cm abnormal fluid collection concerning for possible abscess.  Surgery consulted and plan to to do bedside I&D today. -CTS following, appreciate recs -Bedside I&D later today -Continue vancomycin   New onset A-fib, resolved Sinus brady Remains in NSR but has had intermittent bradycardia since yesterday. CHADVAs score of 2. No electrolyte abnormalities. TSH within normal limits. Patient is at a significant risk of falls at baseline.  -Continue lovenox for now, will not need long term anticoagulation  -Off amiodarone, wean off precedex today -Telemetry -Replete electrolytes   Cardiomyopathy, ischemic Last TTE 09/2017 showed EF 60 to 65% and akinesis of the basal inferior myocardium. Repeat TTE showed EF 50 to 55%, with global hypokinesis but no valvular abnormalities. -Monitor and replete electrolytes -Strict I&O's  Hypothyroidism Repeat stable at TSH 5.5. -Continue Synthroid to 225 mcg daily  Hx adrenal insufficiency Normal a.m. cortisol. -Fludrocortisone 0.1 mg daily to help with BP  Protein calorie malnutrition (HCC) -Cortrak placed this morning  Hx of anoxic brain injury, TBI Seizure disorder -Resume home Haldol per tube -Resume Valproic acid per tube -Continue BID Klonopin and PRN ativan for agitation  Best Practice (right click and "Reselect all SmartList Selections" daily)   Diet/type: NPO w/ oral meds DVT prophylaxis: SCD, lovenox GI prophylaxis: PPI Lines: N/A Foley:  N/A Code Status:  limited Last date of multidisciplinary goals of care discussion  [Pending, Surgery spoke with sister today for consent of bedside I&D, ]  Labs   CBC: Recent Labs  Lab 10/21/22 1833 10/21/22 1926 10/22/22 0454 10/23/22 0431 10/24/22 0600 10/25/22 0702 10/25/22 2241 10/26/22 0628  WBC 13.6*   < > 14.7* 11.2* 11.7* 11.2*  --  10.5  NEUTROABS 11.9*  --   --  8.3*  --   --    --   --   HGB 14.3   < > 11.2* 9.8* 10.7* 10.1* 8.8* 11.6*  HCT 45.1   < > 35.1* 30.4* 33.1* 31.4* 26.0* 35.6*  MCV 92.0   < > 89.8 90.5 88.7 88.2  --  86.4  PLT 301   < > 240 186 232 223  --  231   < > = values in this interval not displayed.    Basic Metabolic Panel: Recent Labs  Lab 10/21/22 1833 10/21/22 1926 10/21/22 2123 10/21/22 2129 10/22/22 0418 10/22/22 0454 10/22/22 1614 10/22/22 2216 10/23/22 0431 10/24/22 0600 10/25/22 2241  NA 142   < >  --    < > 142 141  --  142 139  --  131*  K 4.2   < >  --    < > 4.4 4.3  --  4.6 3.8  --  4.0  CL 107  --   --   --   --  107  --  105 108  --   --  CO2 18*  --   --   --   --  22  --  23 24  --   --   GLUCOSE 129*  --   --   --   --  87  --  107* 113*  --   --   BUN 26*  --   --   --   --  24*  --  22* 18  --   --   CREATININE 1.41*  --  1.28*  --   --  1.05  --  0.88 0.76  --   --   CALCIUM 8.9  --   --   --   --  8.4*  --  8.6* 8.2*  --   --   MG  --    < > 1.7  --   --  1.7 1.6* 1.6* 1.7 2.2  --   PHOS  --   --  3.5  --   --  4.7* 3.8  --  3.1  --   --    < > = values in this interval not displayed.   GFR: Estimated Creatinine Clearance: 100.9 mL/min (by C-G formula based on SCr of 0.76 mg/dL). Recent Labs  Lab 10/21/22 1827 10/21/22 1833 10/21/22 2148 10/22/22 0454 10/22/22 0741 10/23/22 0431 10/24/22 0600 10/25/22 0702 10/26/22 0628  WBC  --    < >  --    < >  --  11.2* 11.7* 11.2* 10.5  LATICACIDVEN 4.8*  --  3.0*  --  1.7  --   --   --   --    < > = values in this interval not displayed.    Liver Function Tests: Recent Labs  Lab 10/21/22 1833 10/23/22 0431  AST 19 11*  ALT 7 6  ALKPHOS 56 40  BILITOT 0.4 0.3  PROT 7.7 5.3*  ALBUMIN 2.5* 1.6*   No results for input(s): "LIPASE", "AMYLASE" in the last 168 hours. No results for input(s): "AMMONIA" in the last 168 hours.  ABG    Component Value Date/Time   PHART 7.36 10/23/2022 0535   PCO2ART 47 10/23/2022 0535   PO2ART 91 10/23/2022 0535    HCO3 24.6 10/25/2022 2241   TCO2 25 10/25/2022 2241   ACIDBASEDEF 4.0 (H) 10/22/2022 0044   O2SAT 46 10/25/2022 2241     Coagulation Profile: Recent Labs  Lab 10/21/22 1833  INR 1.1    Cardiac Enzymes: No results for input(s): "CKTOTAL", "CKMB", "CKMBINDEX", "TROPONINI" in the last 168 hours.  HbA1C: Hgb A1c MFr Bld  Date/Time Value Ref Range Status  11/24/2011 05:15 AM 4.6 <5.7 % Final    Comment:    (NOTE)                                                                       According to the ADA Clinical Practice Recommendations for 2011, when HbA1c is used as a screening test:  >=6.5%   Diagnostic of Diabetes Mellitus           (if abnormal result is confirmed) 5.7-6.4%   Increased risk of developing Diabetes Mellitus References:Diagnosis and Classification of Diabetes Mellitus,Diabetes Care,2011,34(Suppl 1):S62-S69 and Standards of Medical Care in  Diabetes - 2011,Diabetes Care,2011,34 (Suppl 1):S11-S61.  10/05/2011 05:10 AM 5.0 <5.7 % Final    Comment:    (NOTE)                                                                       According to the ADA Clinical Practice Recommendations for 2011, when HbA1c is used as a screening test:  >=6.5%   Diagnostic of Diabetes Mellitus           (if abnormal result is confirmed) 5.7-6.4%   Increased risk of developing Diabetes Mellitus References:Diagnosis and Classification of Diabetes Mellitus,Diabetes ZDGL,8756,43(PIRJJ 1):S62-S69 and Standards of Medical Care in         Diabetes - 2011,Diabetes OACZ,6606,30 (Suppl 1):S11-S61.    CBG: Recent Labs  Lab 10/25/22 1953 10/25/22 2311 10/26/22 0314 10/26/22 0747 10/26/22 1215  GLUCAP 104* 95 87 80 104*    Review of Systems:   As in HPI  Past Medical History:  He,  has a past medical history of Addison disease ([place]), Anoxic brain injury ([place]), Anxiety, Aspiration pneumonia ([place]), Atrial fibrillation ([place]), Chronic constipation (11/23/2011), Dementia ([place]),  Dysphagia, Encephalopathy, Frontal lobe syndrome, Gait abnormality, Glaucoma, Glucocorticoid deficiency ([place]), History of heart attack, History of recurrent UTIs, Hyperlipemia, Hypothyroidism, Mental disorder, Myocardial infarction ([place]), Pneumoperitoneum of unknown etiology (12/21/2011), Quadriplegia ([place]), Reflux, Seizure disorder ([place]), and Weakness of both legs.   Surgical History:   Past Surgical History:  Procedure Laterality Date   DEBRIDEMENT OF ABDOMINAL WALL ABSCESS  01/26/2018   HERNIA MESH REMOVAL  01/26/2018   Infected mesh - removed w I&D abd wall abscess   IR GENERIC HISTORICAL  10/20/2016   IR [place] GASTRO/COLONIC TUBE PERCUT W/FLUORO 10/20/2016 Arne Cleveland, MD [facility]   IR REPLACE G-TUBE SIMPLE WO FLUORO  01/28/2018   IR [place] GASTRO/COLONIC TUBE PERCUT W/FLUORO  04/21/2017   IR REPLC GASTRO/COLONIC TUBE PERCUT W/FLUORO  07/22/2017   IR REPLC GASTRO/COLONIC TUBE PERCUT W/FLUORO  11/24/2017   IR REPLC GASTRO/COLONIC TUBE PERCUT W/FLUORO  08/12/2020   IRRIGATION AND DEBRIDEMENT ABSCESS N/A 01/26/2018   Procedure: IRRIGATION AND DEBRIDEMENT ABDOMINAL WALL ABSCESS WITH REMOVAL OF INFECTED MESH;  Surgeon: Michael Boston, MD;  Location: [facility];  Service: General;  Laterality: N/A;   NEPHRECTOMY  unknown   PEG TUBE PLACEMENT       Social History:   reports that he has never smoked. He has never used smokeless tobacco. He reports that he does not drink alcohol and does not use drugs.   Family History:  His family history is not on file.   Allergies Allergies  Allergen Reactions   Ativan [Lorazepam] Other (See Comments)    Tolerated 09/2022 admission   Latex Dermatitis and Other (See Comments)    "Allergic," per MAR   Morphine And Related Other (See Comments)    Reaction not listed on MAR   Other Other (See Comments)    Soy Sauce  Reaction not listed on MAR   Penicillins Other (See Comments)    Patient tolerated cephalosporins in past. "Allergic," per MAR.  Has  patient had a PCN reaction causing immediate rash, facial/tongue/throat swelling, SOB or lightheadedness with hypotension: unknown Has patient had a PCN reaction causing severe rash involving mucus membranes  or skin necrosis: unknown Has patient had a PCN reaction that required hospitalization: no Has patient had a PCN reaction occurring within the last 10 years: yes If all of the above answe   Pyridium [Phenazopyridine Hcl] Other (See Comments)    Reaction not listed on MAR   Soy Allergy Other (See Comments)    Reaction not listed on MAR   Soybean (Diagnostic) Other (See Comments)    Reaction not listed no MAR     Home Medications  Prior to Admission medications   Medication Sig Start Date End Date Taking? Authorizing Provider  acetaminophen (TYLENOL) 325 MG tablet Take 650 mg by mouth every 4 (four) hours as needed (pain).   Yes [provider]  albuterol (ACCUNEB) 0.63 MG/3ML nebulizer solution Take 1 ampule by nebulization every 6 (six) hours as needed for wheezing or shortness of breath (COPD).   Yes [provider]  aspirin 81 MG chewable tablet Chew 81 mg by mouth every morning. 0900   Yes [provider]  calcium carbonate (TUMS - DOSED IN MG ELEMENTAL CALCIUM) 500 MG chewable tablet Chew 1 tablet by mouth every 12 (twelve) hours as needed for indigestion or heartburn.   Yes [provider]  Carboxymeth-Glycerin-Polysorb (REFRESH OPTIVE ADVANCED) 0.5-1-0.5 % SOLN Place 1 drop into both eyes 3 (three) times daily. 0900, 1400, 2100   Yes [provider]  clonazePAM (KLONOPIN) 0.5 MG tablet Take 0.5 mg by mouth 2 (two) times daily. 0830 and 1630   Yes [provider]  divalproex (DEPAKOTE SPRINKLE) 125 MG capsule Take 500 mg by mouth 3 (three) times daily. 0830, 1230, and 2030 07/18/13  Yes [provider]  docusate sodium (COLACE) 100 MG capsule Take 100 mg by mouth every morning. 0800   Yes [provider]   haloperidol (HALDOL) 5 MG tablet Take 5 mg by mouth 2 (two) times daily. 0830 and 2030   Yes [provider]  levothyroxine (SYNTHROID) 200 MCG tablet Take 200 mcg by mouth daily before breakfast. Take with a 25 mcg tablet for a total dose of 225 mcg every morning 0630   Yes [provider]  levothyroxine (SYNTHROID) 25 MCG tablet Take 25 mcg by mouth daily before breakfast. Give with current 200 mcg dose for 225 mcg 0630   Yes [provider]  Magnesium Hydroxide (MILK OF MAGNESIA PO) Take 30 mLs by mouth daily as needed (constipation).   Yes [provider]  omeprazole (PRILOSEC) 20 MG capsule Take 20 mg by mouth at bedtime.   Yes [provider]  polyethylene glycol (MIRALAX / GLYCOLAX) packet Take 17 g by mouth 2 (two) times daily. 0900 and 2100   Yes [provider]  senna (SENOKOT) 8.6 MG tablet Take 2 tablets by mouth 2 (two) times daily. 0900 and 2000   Yes [provider]  sertraline (ZOLOFT) 50 MG tablet Take 75 mg by mouth every morning. 0900   Yes [provider]  cephALEXin (KEFLEX) 500 MG capsule Take 1 capsule (500 mg total) by mouth 4 (four) times daily. Patient not taking: Reported on 05/24/2022 05/15/22   [name], MD  fosfomycin (MONUROL) 3 g PACK Take 3 g by mouth See admin instructions. Give 3 g packet by mouth one time a day for UTI for one day  0900    [provider]  pantoprazole (PROTONIX) 40 MG tablet Take 40 mg by mouth daily. 0700    [provider]  pantoprazole sodium (  PROTONIX) 40 mg/20 mL PACK Take 40 mg by mouth every morning. Patient not taking: Reported on 05/24/2022 11/30/11   [name], MD  traMADol (ULTRAM) 50 MG tablet Take 50 mg by mouth every 6 (six) hours as needed (pain).    [provider]     Critical care time: 32

## 2022-10-26 NOTE — Plan of Care (Signed)

## 2022-10-26 NOTE — Progress Notes (Signed)
PT removed foley. [place] notified. Will attempt external urinary catheter.

## 2022-10-27 DIAGNOSIS — J9601 Acute respiratory failure with hypoxia: Secondary | ICD-10-CM | POA: Diagnosis not present

## 2022-10-27 LAB — BASIC METABOLIC PANEL
Anion gap: 9 (ref 5–15)
BUN: 12 mg/dL (ref 6–20)
CO2: 24 mmol/L (ref 22–32)
Calcium: 7.8 mg/dL — ABNORMAL LOW (ref 8.9–10.3)
Chloride: 105 mmol/L (ref 98–111)
Creatinine, Ser: 0.78 mg/dL (ref 0.61–1.24)
GFR, Estimated: >60 mL/min (ref >60)
Glucose, Bld: 98 mg/dL (ref 70–99)
Potassium: 4.1 mmol/L (ref 3.5–5.1)
Sodium: 138 mmol/L (ref 135–145)

## 2022-10-27 LAB — GLUCOSE, CAPILLARY
Glucose-Capillary: 111 mg/dL — ABNORMAL HIGH (ref 70–99)
Glucose-Capillary: 101 mg/dL — ABNORMAL HIGH (ref 70–99)
Glucose-Capillary: 63 mg/dL — ABNORMAL LOW (ref 70–99)
Glucose-Capillary: 65 mg/dL — ABNORMAL LOW (ref 70–99)
Glucose-Capillary: 68 mg/dL — ABNORMAL LOW (ref 70–99)
Glucose-Capillary: 76 mg/dL (ref 70–99)
Glucose-Capillary: 82 mg/dL (ref 70–99)
Glucose-Capillary: 87 mg/dL (ref 70–99)

## 2022-10-27 LAB — VANCOMYCIN, TROUGH: Vancomycin Tr: 17 ug/mL (ref 15–20)

## 2022-10-27 MED ORDER — ENOXAPARIN SODIUM 40 MG/0.4ML IJ SOSY
40.0000 mg | PREFILLED_SYRINGE | INTRAMUSCULAR | Status: DC
  Administered 2022-10-28 – 2022-11-07 (×11): 40 mg via SUBCUTANEOUS
  Filled 2022-10-27 (×11): qty 0.4

## 2022-10-27 MED ORDER — VANCOMYCIN HCL IN DEXTROSE 1-5 GM/200ML-% IV SOLN
1000.0000 mg | Freq: Two times a day (BID) | INTRAVENOUS | Status: DC
  Administered 2022-10-27 – 2022-10-28 (×3): 1000 mg via INTRAVENOUS
  Filled 2022-10-27 (×3): qty 200

## 2022-10-27 MED ORDER — MIDODRINE HCL 5 MG PO TABS
10.0000 mg | ORAL_TABLET | Freq: Three times a day (TID) | ORAL | Status: DC
  Administered 2022-10-27 – 2022-11-07 (×33): 10 mg
  Filled 2022-10-27 (×33): qty 2

## 2022-10-27 MED ORDER — LACTATED RINGERS IV BOLUS
1000.0000 mL | Freq: Once | INTRAVENOUS | Status: AC
  Administered 2022-10-27: 1000 mL via INTRAVENOUS

## 2022-10-27 MED ORDER — VANCOMYCIN HCL IN DEXTROSE 1-5 GM/200ML-% IV SOLN: 1000.0000 mg | Freq: Two times a day (BID) | INTRAVENOUS | Status: DC

## 2022-10-27 MED ORDER — ACETAMINOPHEN 160 MG/5ML PO SOLN
650.0000 mg | Freq: Four times a day (QID) | ORAL | Status: DC | PRN
  Administered 2022-10-27 – 2022-11-07 (×13): 650 mg
  Filled 2022-10-27 (×14): qty 20.3

## 2022-10-27 MED ORDER — LACTATED RINGERS IV SOLN: INTRAVENOUS | Status: DC

## 2022-10-27 NOTE — Progress Notes (Signed)
Pharmacy Antibiotic Note-Follow Up  [name] is a 56 y.o. male admitted on 10/21/2022 with respiratory failure. Pharmacy has been consulted for Vancomycin dosing. Wound Culture from 10/30 is growing MRSA and Wound culture from 10/31 is pending. CrCl is 91.1 ml/min and Vanc peak and trough have been drawn for steady state dosing.   Vanc Peak 29 and Vanc trough 17 (vanc peak drawn early)  Plan: Continue Vancomycin 1000mg  IV Q12H (eAUC 532) F/u cultures for de-escalation and length of therapy Monitor renal function for dose adjustments  Height: 5\' 8"  (172.7 cm) Weight: 61.7 kg (136 lb 0.4 oz) IBW/kg (Calculated) : 68.4  Temp (24hrs), Avg:97.9 F (36.6 C), Min:97.4 F (36.3 C), Max:98.3 F (36.8 C)  Recent Labs  Lab 10/21/22 1827 10/21/22 1833 10/21/22 2148 10/22/22 0454 10/22/22 0741 10/22/22 2216 10/23/22 0431 10/24/22 0600 10/25/22 0702 10/26/22 0628 10/26/22 2311 10/27/22 1037  WBC  --    < >  --  14.7*  --   --  11.2* 11.7* 11.2* 10.5  --   --   CREATININE  --    < >  --  1.05  --  0.88 0.76  --   --  0.68  --  0.78  LATICACIDVEN 4.8*  --  3.0*  --  1.7  --   --   --   --   --   --   --   VANCOTROUGH  --   --   --   --   --   --   --   --   --   --   --  17  VANCOPEAK  --   --   --   --   --   --   --   --   --   --  29*  --    < > = values in this interval not displayed.     Estimated Creatinine Clearance: 91.1 mL/min (by C-G formula based on SCr of 0.78 mg/dL).    Allergies  Allergen Reactions   Ativan [Lorazepam] Other (See Comments)    Tolerated 09/2022 admission   Latex Dermatitis and Other (See Comments)    "Allergic," per MAR   Morphine And Related Other (See Comments)    Reaction not listed on MAR   Other Other (See Comments)    Soy Sauce  Reaction not listed on MAR   Penicillins Other (See Comments)    Patient tolerated cephalosporins in past. "Allergic," per MAR.  Has patient had a PCN reaction causing immediate rash, facial/tongue/throat  swelling, SOB or lightheadedness with hypotension: unknown Has patient had a PCN reaction causing severe rash involving mucus membranes or skin necrosis: unknown Has patient had a PCN reaction that required hospitalization: no Has patient had a PCN reaction occurring within the last 10 years: yes If all of the above answe   Pyridium [Phenazopyridine Hcl] Other (See Comments)    Reaction not listed on MAR   Soy Allergy Other (See Comments)    Reaction not listed on MAR   Soybean (Diagnostic) Other (See Comments)    Reaction not listed no MAR    Thank you for allowing pharmacy to be a part of this patient's care.  Sandford Craze, PharmD. [facility] Acute Care PGY-1  10/27/2022 12:30 PM

## 2022-10-27 NOTE — Progress Notes (Signed)
Phlebotomy unable to stick patient for 24 hrs due to having to stick patient multiple times for blood. MD made aware that CBC was not drawn due to 24 hr policy. No other needs at this time.

## 2022-10-27 NOTE — Progress Notes (Signed)
Patient's blood sugar was 68 on recheck. Coy Saunas, MD made aware. Patient currently eating lunch and receiving tube feeds at time of notification. Blood sugar when finished eating was 76. No interventions needed.

## 2022-10-27 NOTE — Progress Notes (Signed)
Speech Language Pathology Treatment: Dysphagia  Patient Details Name: [name] MRN: [id number] DOB: 1966/11/02 Today's Date: 10/27/2022 Time: 4008-6761 SLP Time Calculation (min) (ACUTE ONLY): 14 min  Assessment / Plan / Recommendation Clinical Impression  Pt seen for ongoing dysphagia management.  Pt appears significantly improved from initial assessment on 10/29.  Pt tolerated ice chips, puree and regular textures, honey thick liquid by spoon and cup, and thin liquid by spoon.  With small straw sip of thin liquid, there was immediate, prolonged wet cough.  Pt unable to state his baseline diet with long hx dysphagia, but in 2018 pt completed MBS during which audible aspiration was observed.  At that time recommendations were for honey thick liquids with mechanical soft solids.  Pt is tolerating these consistencies clinically at this time and may resume this diet.  Recommend repeat MBS to assess for any change in swallow function and determine safe, least restrictive diet.  Recommend mechanical soft solids and honey thick liquids, pending results of MBS.     HPI HPI: 56 year old male with prior history of cardiac arrest with anoxic brain injury who presented with acute hypoxic respiratory failure in the setting of bilateral lower lobe pneumonia requiring endotracheal intubation.  Pt was intubated from 10/25-10/28.  MBS in 2018 recommending Dysphagia 3 solids and honey-thick liquids.      SLP Plan  MBS      Recommendations for follow up therapy are one component of a multi-disciplinary discharge planning process, led by the attending physician.  Recommendations may be updated based on patient status, additional functional criteria and insurance authorization.    Recommendations  Diet recommendations: Dysphagia 3 (mechanical soft);Honey-thick liquid Liquids provided via: Cup Medication Administration: Crushed with puree Supervision: Trained caregiver to feed patient Compensations:  Slow rate;Small sips/bites Postural Changes and/or Swallow Maneuvers: Seated upright 90 degrees                Oral Care Recommendations: Oral care QID;Staff/trained caregiver to provide oral care Follow Up Recommendations: Skilled nursing-short term rehab (<3 hours/day) Assistance recommended at discharge: Frequent or constant Supervision/Assistance SLP Visit Diagnosis: Dysphagia, oropharyngeal phase (R13.12) Plan: [place], [place], [place] Office: [phone] 10/27/2022, 10:07 AM

## 2022-10-27 NOTE — Progress Notes (Signed)
[facility] Progress Note Patient Name: [name] DOB: [birth year] MRN: [id number]   Date of Service  10/27/2022  HPI/Events of Note  Patient c/o pain. Tylenol ordered for fever only. AST and ALT both normal.   eICU Interventions  Will change Tylenol PRN indications to include mild and moderate pain in addition to Temp > 101.0 F.     Intervention Category Major Interventions: Other:  Lysle Dingwall 10/27/2022, 11:15 PM

## 2022-10-27 NOTE — Progress Notes (Signed)
Abdominal wall dressing changed. Purulent drainage on outer abdominal pad. Dry dressing applied.

## 2022-10-27 NOTE — Plan of Care (Signed)

## 2022-10-27 NOTE — Progress Notes (Signed)
IV watch flashing red. IV site assessed, no swelling or redness. IV flushes but does not draw back. IV team consult placed for an ultrasound guided IV.

## 2022-10-27 NOTE — Progress Notes (Addendum)
NAME:  [name], MRN:  [id number], DOB:  Jul 08, 1966, LOS: 6 ADMISSION DATE:  10/21/2022, CONSULTATION DATE:  10/25 REFERRING MD:  Theresia Lo, EDP CHIEF COMPLAINT:  Hypoxia   History of Present Illness:  56 year old man admitted from [facility] for hypoxia and unresponsiveness. Limited information available. May have aspirated. Hypoxic to 70's and agitated in ED so intubated.    Chronic SNF resident due to demential and anoxic brain injury, gait difficulties. May be bed bound.    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  He eventually recovered but had subsequent anoxic brain injury.  He has been institution bound since approximately 2007.  His sister states that he has had a significant functional decline in the past 2 years since their mother passed away.  Pertinent  Medical History   Past Medical History:  Diagnosis Date   Addison disease (HCC)    Anoxic brain injury (HCC)    TBI   Anxiety    Aspiration pneumonia (HCC)    Atrial fibrillation (HCC)    Chronic constipation 11/23/2011   Dementia (HCC)    Dysphagia    Encephalopathy    Frontal lobe syndrome    Gait abnormality    freq falls   Glaucoma    Glucocorticoid deficiency (HCC)    History of heart attack    History of recurrent UTIs    Hyperlipemia    Hypothyroidism    Mental disorder    Myocardial infarction (HCC)    Pneumoperitoneum of unknown etiology 12/21/2011   Quadriplegia (HCC)    Reflux    Seizure disorder (HCC)    Weakness of both legs     Significant Hospital Events: Including procedures, antibiotic start and stop dates in addition to other pertinent events   10/25 - admitted via ED. 10/27: A-fib overnight,  started amiodarone drip 10/28: Extubated to [place] 10/28: Abd U/S 6.7 x 6.8 cm hernia versus hematoma 10/28: CT A/P Enlarging 10.7 x 8.6 x 7.5 cm fluid collection 10/30: Bedside I&D by general surgery  Interim History / Subjective:  No acute events overnight No behavior issues or agitation noticed by RN overnight Patient calm and following commands this morning  Objective   Blood pressure 90/64, pulse 63, temperature 98.3 F (36.8 C), temperature source Oral, resp. rate (!) 26, height 5\' 8"  (1.727 m), weight 61.7 kg, SpO2 98 %.        Intake/Output Summary (Last 24 hours) at 10/27/2022 0711 Last data filed at 10/27/2022 0600 Gross per 24 hour  Intake 1911.81 ml  Output 950 ml  Net 961.81 ml   Filed Weights   10/25/22 0430 10/26/22 0500 10/27/22 0500  Weight: 72 kg 68.4 kg 61.7 kg   Examination: General: Chronically ill, cachectic middle-age man laying in bed. HENT: moist mucous membranes Lungs: On 2 L [place]. Anterior lung sounds clear to auscultation.  No WOB. Cardiovascular: Bradycardic.  Regular rhythm.  No murmurs, rubs or gallops.   Abdomen: Soft.  NT/ND. Abdominal wall hernia, abdominal wall fluctuant mass covered with dressing. Extremities: Warm and dry. Neuro: Alert and awake. Calm and interactive. Moving all extremities. GU: Condom cath in place  CBC pending, BMP unremarkable Wound culture growing gram-positive  cocci in clusters CBGs 60s to 100s  Resolved Hospital Problem list   AKI Respiratory acidosis Afib  Assessment & Plan:  Septic shock 2/2 pneumonia HCAP Weaned off Levophed this morning.  Remains afebrile.  Mental status seem to be back to baseline.  Calm and interactive this morning.  -Continue ceftriaxone and vancomycin -Start midodrine 10 mg q8h -Discontinue Florinef -Transfer out of ICU later today if blood pressure remained stable  Acute respiratory failure with hypoxia (HCC) Respiratory acidosis, resolved Extubated to 3 LNC on 10/28. Remains on 2  L [place] but continues to do well from a respiratory standpoint. -Continue O2 supplementation, wean for O2 sat >92% -Scopolamine patch to help with secretions  R abdominal wall abscess Hx of abdominal hernia CT A/P showed enlarging 10.7 x 8.6 x 7.5 cm abnormal fluid collection concerning for possible abscess.  Successful bedside I&D by CCS yesterday with wound culture Gram stain showing rare gram-positive cocci in clusters. -CCS following, appreciate recs -Continue vancomycin, de-escalate based culture and sensitivities -Dressing changes per CCS   New onset A-fib, resolved Sinus brady Remains in NSR but has had intermittent bradycardia for the last 48 hr. CHADVAs score of 2. No electrolyte abnormalities. TSH within normal limits. Patient is at a significant risk of falls at baseline.  Patient's risk of bleeding outweighs his risk of stroke so we will hold off on long-term anticoagulation. -Telemetry -Lovenox for DVT prophylaxis -Trend and replete electrolytes   Cardiomyopathy, ischemic Last TTE 09/2017 showed EF 60 to 65% and akinesis of the basal inferior myocardium. Repeat TTE showed EF 50 to 55%, with global hypokinesis but no valvular abnormalities. -Monitor and replete electrolytes -Strict I&O's  Hypothyroidism Repeat stable at TSH 5.5. -Continue Synthroid to 225 mcg daily  Protein calorie malnutrition (HCC) -Cortrak placed on 10/30.  Evaluated by SLP this morning who recommended dysphagia 3 diet with plan for pulm swallow.  -Follow-up MBS -Dysphagia 3 diet -Continue TF for now  Hx of anoxic brain injury, TBI Seizure disorder -Continue home Haldol per tube -Continue valproic acid per tube -Continue BID Klonopin and PRN ativan for agitation  Best Practice (right click and "Reselect all SmartList Selections" daily)   Diet/type: tubefeeds and dysphagia diet (see orders) DVT prophylaxis: SCD, lovenox GI prophylaxis: PPI Lines: N/A Foley:  N/A Code Status:  limited Last  date of multidisciplinary goals of care discussion  [Pending ]  Labs   CBC: Recent Labs  Lab 10/21/22 1833 10/21/22 1926 10/22/22 0454 10/23/22 0431 10/24/22 0600 10/25/22 0702 10/25/22 2241 10/26/22 0628  WBC 13.6*   < > 14.7* 11.2* 11.7* 11.2*  --  10.5  NEUTROABS 11.9*  --   --  8.3*  --   --   --   --   HGB 14.3   < > 11.2* 9.8* 10.7* 10.1* 8.8* 11.6*  HCT 45.1   < > 35.1* 30.4* 33.1* 31.4* 26.0* 35.6*  MCV 92.0   < > 89.8 90.5 88.7 88.2  --  86.4  PLT 301   < > 240 186 232 223  --  231   < > = values in this interval not displayed.    Basic Metabolic Panel: Recent Labs  Lab 10/21/22 1833 10/21/22 1926 10/21/22 2123 10/21/22 2129 10/22/22 0454 10/22/22 1614 10/22/22 2216 10/23/22 0431 10/24/22 0600 10/25/22 2241 10/26/22 0628  NA 142   < >  --    < > 141  --  142 139  --  131* 136  K 4.2   < >  --    < >  4.3  --  4.6 3.8  --  4.0 4.2  CL 107  --   --   --  107  --  105 108  --   --  103  CO2 18*  --   --   --  22  --  23 24  --   --  22  GLUCOSE 129*  --   --   --  87  --  107* 113*  --   --  90  BUN 26*  --   --   --  24*  --  22* 18  --   --  10  CREATININE 1.41*  --  1.28*  --  1.05  --  0.88 0.76  --   --  0.68  CALCIUM 8.9  --   --   --  8.4*  --  8.6* 8.2*  --   --  8.1*  MG  --    < > 1.7  --  1.7 1.6* 1.6* 1.7 2.2  --   --   PHOS  --   --  3.5  --  4.7* 3.8  --  3.1  --   --   --    < > = values in this interval not displayed.   GFR: Estimated Creatinine Clearance: 91.1 mL/min (by C-G formula based on SCr of 0.68 mg/dL). Recent Labs  Lab 10/21/22 1827 10/21/22 1833 10/21/22 2148 10/22/22 0454 10/22/22 0741 10/23/22 0431 10/24/22 0600 10/25/22 0702 10/26/22 0628  WBC  --    < >  --    < >  --  11.2* 11.7* 11.2* 10.5  LATICACIDVEN 4.8*  --  3.0*  --  1.7  --   --   --   --    < > = values in this interval not displayed.    Liver Function Tests: Recent Labs  Lab 10/21/22 1833 10/23/22 0431  AST 19 11*  ALT 7 6  ALKPHOS 56 40  BILITOT  0.4 0.3  PROT 7.7 5.3*  ALBUMIN 2.5* 1.6*   No results for input(s): "LIPASE", "AMYLASE" in the last 168 hours. No results for input(s): "AMMONIA" in the last 168 hours.  ABG    Component Value Date/Time   PHART 7.36 10/23/2022 0535   PCO2ART 47 10/23/2022 0535   PO2ART 91 10/23/2022 0535   HCO3 24.6 10/25/2022 2241   TCO2 25 10/25/2022 2241   ACIDBASEDEF 4.0 (H) 10/22/2022 0044   O2SAT 46 10/25/2022 2241     Coagulation Profile: Recent Labs  Lab 10/21/22 1833  INR 1.1    Cardiac Enzymes: No results for input(s): "CKTOTAL", "CKMB", "CKMBINDEX", "TROPONINI" in the last 168 hours.  HbA1C: Hgb A1c MFr Bld  Date/Time Value Ref Range Status  11/24/2011 05:15 AM 4.6 <5.7 % Final    Comment:    (NOTE)                                                                       According to the ADA Clinical Practice Recommendations for 2011, when HbA1c is used as a screening test:  >=6.5%   Diagnostic of Diabetes Mellitus           (if abnormal result is  confirmed) 5.7-6.4%   Increased risk of developing Diabetes Mellitus References:Diagnosis and Classification of Diabetes Mellitus,Diabetes Care,2011,34(Suppl 1):S62-S69 and Standards of Medical Care in         Diabetes - 2011,Diabetes NATF,5732,20 (Suppl 1):S11-S61.  10/05/2011 05:10 AM 5.0 <5.7 % Final    Comment:    (NOTE)                                                                       According to the ADA Clinical Practice Recommendations for 2011, when HbA1c is used as a screening test:  >=6.5%   Diagnostic of Diabetes Mellitus           (if abnormal result is confirmed) 5.7-6.4%   Increased risk of developing Diabetes Mellitus References:Diagnosis and Classification of Diabetes Mellitus,Diabetes URKY,7062,37(SEGBT 1):S62-S69 and Standards of Medical Care in         Diabetes - 2011,Diabetes DVVO,1607,37 (Suppl 1):S11-S61.    CBG: Recent Labs  Lab 10/26/22 1215 10/26/22 1610 10/26/22 1942 10/26/22 2336  10/27/22 0337  GLUCAP 104* 95 123* 104* 111*    Review of Systems:   As in HPI  Past Medical History:  He,  has a past medical history of Addison disease ([place]), Anoxic brain injury ([place]), Anxiety, Aspiration pneumonia ([place]), Atrial fibrillation ([place]), Chronic constipation (11/23/2011), Dementia ([place]), Dysphagia, Encephalopathy, Frontal lobe syndrome, Gait abnormality, Glaucoma, Glucocorticoid deficiency ([place]), History of heart attack, History of recurrent UTIs, Hyperlipemia, Hypothyroidism, Mental disorder, Myocardial infarction ([place]), Pneumoperitoneum of unknown etiology (12/21/2011), Quadriplegia ([place]), Reflux, Seizure disorder ([place]), and Weakness of both legs.   Surgical History:   Past Surgical History:  Procedure Laterality Date   DEBRIDEMENT OF ABDOMINAL WALL ABSCESS  01/26/2018   HERNIA MESH REMOVAL  01/26/2018   Infected mesh - removed w I&D abd wall abscess   IR GENERIC HISTORICAL  10/20/2016   IR [place] GASTRO/COLONIC TUBE PERCUT W/FLUORO 10/20/2016 Arne Cleveland, MD [facility]INTERV [facility]   IR REPLACE G-TUBE SIMPLE WO FLUORO  01/28/2018   IR [place] GASTRO/COLONIC TUBE PERCUT W/FLUORO  04/21/2017   IR [place] GASTRO/COLONIC TUBE PERCUT W/FLUORO  07/22/2017   IR REPLC GASTRO/COLONIC TUBE PERCUT W/FLUORO  11/24/2017   IR REPLC GASTRO/COLONIC TUBE PERCUT W/FLUORO  08/12/2020   IRRIGATION AND DEBRIDEMENT ABSCESS N/A 01/26/2018   Procedure: IRRIGATION AND DEBRIDEMENT ABDOMINAL WALL ABSCESS WITH REMOVAL OF INFECTED MESH;  Surgeon: Michael Boston, MD;  Location: [facility];  Service: General;  Laterality: N/A;   NEPHRECTOMY  unknown   PEG TUBE PLACEMENT       Social History:   reports that he has never smoked. He has never used smokeless tobacco. He reports that he does not drink alcohol and does not use drugs.   Family History:  His family history is not on file.   Allergies Allergies  Allergen Reactions   Ativan [Lorazepam] Other (See Comments)    Tolerated 09/2022 admission   Latex  Dermatitis and Other (See Comments)    "Allergic," per MAR   Morphine And Related Other (See Comments)    Reaction not listed on MAR   Other Other (See Comments)    Soy Sauce  Reaction not listed on MAR   Penicillins Other (See Comments)    Patient tolerated cephalosporins in past. "Allergic,"  per [facility].  Has patient had a PCN reaction causing immediate rash, facial/tongue/throat swelling, SOB or lightheadedness with hypotension: unknown Has patient had a PCN reaction causing severe rash involving mucus membranes or skin necrosis: unknown Has patient had a PCN reaction that required hospitalization: no Has patient had a PCN reaction occurring within the last 10 years: yes If all of the above answe   Pyridium [Phenazopyridine Hcl] Other (See Comments)    Reaction not listed on MAR   Soy Allergy Other (See Comments)    Reaction not listed on MAR   Soybean (Diagnostic) Other (See Comments)    Reaction not listed no MAR     Home Medications  Prior to Admission medications   Medication Sig Start Date End Date Taking? Authorizing Provider  acetaminophen (TYLENOL) 325 MG tablet Take 650 mg by mouth every 4 (four) hours as needed (pain).   Yes [provider]  albuterol (ACCUNEB) 0.63 MG/3ML nebulizer solution Take 1 ampule by nebulization every 6 (six) hours as needed for wheezing or shortness of breath (COPD).   Yes [provider]  aspirin 81 MG chewable tablet Chew 81 mg by mouth every morning. 0900   Yes [provider]  calcium carbonate (TUMS - DOSED IN MG ELEMENTAL CALCIUM) 500 MG chewable tablet Chew 1 tablet by mouth every 12 (twelve) hours as needed for indigestion or heartburn.   Yes [provider]  Carboxymeth-Glycerin-Polysorb (REFRESH OPTIVE ADVANCED) 0.5-1-0.5 % SOLN Place 1 drop into both eyes 3 (three) times daily. 0900, 1400, 2100   Yes [provider]  clonazePAM (KLONOPIN) 0.5 MG tablet Take 0.5 mg by mouth 2 (two) times daily.  0830 and 1630   Yes [provider]  divalproex (DEPAKOTE SPRINKLE) 125 MG capsule Take 500 mg by mouth 3 (three) times daily. 0830, 1230, and 2030 07/18/13  Yes [provider]  docusate sodium (COLACE) 100 MG capsule Take 100 mg by mouth every morning. 0800   Yes [provider]  haloperidol (HALDOL) 5 MG tablet Take 5 mg by mouth 2 (two) times daily. 0830 and 2030   Yes [provider]  levothyroxine (SYNTHROID) 200 MCG tablet Take 200 mcg by mouth daily before breakfast. Take with a 25 mcg tablet for a total dose of 225 mcg every morning 0630   Yes [provider]  levothyroxine (SYNTHROID) 25 MCG tablet Take 25 mcg by mouth daily before breakfast. Give with current 200 mcg dose for 225 mcg 0630   Yes [provider]  Magnesium Hydroxide (MILK OF MAGNESIA PO) Take 30 mLs by mouth daily as needed (constipation).   Yes [provider]  omeprazole (PRILOSEC) 20 MG capsule Take 20 mg by mouth at bedtime.   Yes [provider]  polyethylene glycol (MIRALAX / GLYCOLAX) packet Take 17 g by mouth 2 (two) times daily. 0900 and 2100   Yes [provider]  senna (SENOKOT) 8.6 MG tablet Take 2 tablets by mouth 2 (two) times daily. 0900 and 2000   Yes [provider]  sertraline (ZOLOFT) 50 MG tablet Take 75 mg by mouth every morning. 0900   Yes [provider]  cephALEXin (KEFLEX) 500 MG capsule Take 1 capsule (500 mg total) by mouth 4 (four) times daily. Patient not taking: Reported on 05/24/2022 05/15/22   [name], MD  fosfomycin (MONUROL) 3 g PACK Take 3 g by mouth See admin instructions. Give 3 g packet by mouth one time a day for UTI for  one day  0900    [provider]  pantoprazole (PROTONIX) 40 MG tablet Take 40 mg by mouth daily. 0700    [provider]  pantoprazole sodium (PROTONIX) 40 mg/20 mL PACK Take 40 mg by mouth every morning. Patient not taking: Reported on  05/24/2022 11/30/11   [name], MD  traMADol (ULTRAM) 50 MG tablet Take 50 mg by mouth every 6 (six) hours as needed (pain).    [provider]     Critical care time: 37

## 2022-10-27 NOTE — Progress Notes (Signed)
Patient with increasing agitation and complaining of pain at abdominal incision site. Incision site reassessed minimal sanguineous drainage on gauze and reddened area around incision. PRN Ativan given for agitation and Elink notified of patient complaints of pain.

## 2022-10-27 NOTE — Progress Notes (Signed)
[facility] Progress Note     Subjective: CC-  Much more awake and interactive this morning. Abdominal wall dressing changed due to saturation  Objective: Vital signs in last 24 hours: Temp:  [96.9 F (36.1 C)-98.3 F (36.8 C)] 98.2 F (36.8 C) (10/31 0800) Pulse Rate:  [37-69] 53 (10/31 0815) Resp:  [10-38] 24 (10/31 0815) BP: (68-149)/(32-119) 132/119 (10/31 0815) SpO2:  [94 %-98 %] 97 % (10/31 0815) Weight:  [61.7 kg] 61.7 kg (10/31 0500) Last BM Date : 10/27/22  Intake/Output from previous day: 10/30 0701 - 10/31 0700 In: 1985.3 [I.V.:497.6; NG/GT:987.7; IV Piggyback:500.1] Out: 950 [Urine:950] Intake/Output this shift: Total I/O In: 136.2 [I.V.:26.2; NG/GT:110] Out: 100 [Urine:100]  PE: Gen:  Alert, NAD Abd: right abdominal wall abscess s/p I&D with some persistent induration and cellulitis, purulent drainage noted during packing exchange  Lab Results:  Recent Labs    10/25/22 0702 10/25/22 2241 10/26/22 0628  WBC 11.2*  --  10.5  HGB 10.1* 8.8* 11.6*  HCT 31.4* 26.0* 35.6*  PLT 223  --  231   BMET Recent Labs    10/25/22 2241 10/26/22 0628  NA 131* 136  K 4.0 4.2  CL  --  103  CO2  --  22  GLUCOSE  --  90  BUN  --  10  CREATININE  --  0.68  CALCIUM  --  8.1*   PT/INR No results for input(s): "LABPROT", "INR" in the last 72 hours. CMP     Component Value Date/Time   NA 136 10/26/2022 0628   K 4.2 10/26/2022 0628   CL 103 10/26/2022 0628   CO2 22 10/26/2022 0628   GLUCOSE 90 10/26/2022 0628   BUN 10 10/26/2022 0628   CREATININE 0.68 10/26/2022 0628   CALCIUM 8.1 (L) 10/26/2022 0628   CALCIUM 8.7 02/19/2011 1515   PROT 5.3 (L) 10/23/2022 0431   ALBUMIN 1.6 (L) 10/23/2022 0431   AST 11 (L) 10/23/2022 0431   ALT 6 10/23/2022 0431   ALKPHOS 40 10/23/2022 0431   BILITOT 0.3 10/23/2022 0431   GFRNONAA >60 10/26/2022 0628   GFRAA >60 02/23/2020 1600   Lipase     Component Value Date/Time   LIPASE 26 01/02/2013 2230        Studies/Results: DG Abd Portable 1V  Result Date: 10/26/2022 CLINICAL DATA:  Feeding tube placement EXAM: PORTABLE ABDOMEN - 1 VIEW COMPARISON:  06/02/2021 FINDINGS: Tip of feeding tube is seen in the distal antrum of the stomach. Lower abdomen and pelvis are not included. Bowel gas pattern in the visualized portions of abdomen is unremarkable. There are multiple surgical clips in right mid abdomen. Right hemidiaphragm is elevated. Left diaphragmatic margin is indistinct, possibly suggesting small pleural effusion. IMPRESSION: Tip of feeding tube is seen in the distal antrum of the stomach. Electronically Signed   By: Elmer Picker M.D.   On: 10/26/2022 10:29    Anti-infectives: Anti-infectives (From admission, onward)    Start     Dose/Rate Route Frequency Ordered Stop   10/24/22 2200  vancomycin (VANCOCIN) IVPB 1000 mg/200 mL premix        1,000 mg 200 mL/hr over 60 Minutes Intravenous Every 12 hours 10/24/22 1138     10/24/22 1230  vancomycin (VANCOREADY) IVPB 1250 mg/250 mL        1,250 mg 166.7 mL/hr over 90 Minutes Intravenous  Once 10/24/22 1138 10/24/22 1431   10/23/22 1130  cefTRIAXone (ROCEPHIN) 2 g in sodium chloride 0.9 % 100  mL IVPB        2 g 200 mL/hr over 30 Minutes Intravenous Every 24 hours 10/23/22 1041 10/26/22 1324   10/22/22 2200  ceFEPIme (MAXIPIME) 2 g in sodium chloride 0.9 % 100 mL IVPB  Status:  Discontinued        2 g 200 mL/hr over 30 Minutes Intravenous Every 8 hours 10/22/22 1559 10/23/22 1041   10/22/22 0900  vancomycin (VANCOREADY) IVPB 750 mg/150 mL  Status:  Discontinued        750 mg 150 mL/hr over 60 Minutes Intravenous Every 12 hours 10/21/22 2041 10/22/22 1559   10/22/22 0700  ceFEPIme (MAXIPIME) 2 g in sodium chloride 0.9 % 100 mL IVPB  Status:  Discontinued        2 g 200 mL/hr over 30 Minutes Intravenous Every 12 hours 10/21/22 2041 10/22/22 1559   10/21/22 1900  vancomycin (VANCOREADY) IVPB 1750 mg/350 mL        1,750 mg 175  mL/hr over 120 Minutes Intravenous  Once 10/21/22 1854 10/21/22 2125   10/21/22 1900  ceFEPIme (MAXIPIME) 2 g in sodium chloride 0.9 % 100 mL IVPB        2 g 200 mL/hr over 30 Minutes Intravenous  Once 10/21/22 1854 10/21/22 1924   10/21/22 1900  azithromycin (ZITHROMAX) 500 mg in sodium chloride 0.9 % 250 mL IVPB  Status:  Discontinued        500 mg 250 mL/hr over 60 Minutes Intravenous Every 24 hours 10/21/22 1854 10/23/22 1041   10/21/22 1845  vancomycin (VANCOCIN) IVPB 1000 mg/200 mL premix  Status:  Discontinued        1,000 mg 200 mL/hr over 60 Minutes Intravenous  Once 10/21/22 1835 10/21/22 1851   10/21/22 1845  levofloxacin (LEVAQUIN) IVPB 750 mg  Status:  Discontinued        750 mg 100 mL/hr over 90 Minutes Intravenous  Once 10/21/22 1835 10/21/22 1849        Assessment/Plan Right abdominal wall abscess History of infected mesh - s/p bedside I&D 10/30 - continue IV antibiotics and follow culture - daily packing changes with 1/4 inch iodoform. Dry dressing over top, change PRN saturation  ID - currently vancomycin FEN - TF VTE - lovenox Foley - none  Right lumbar hernia Acute respiratory failure - extubated 10/28 Bradycardia  Sepsis Pneumonia  A fib - resolved H/o adrenal insufficiency Hx anoxic brain injury, TBI  I reviewed CCM notes, last 24 h vitals and pain scores, last 48 h intake and output, last 24 h labs and trends, and last 24 h imaging results.    LOS: 6 days    Franne Forts, [facility] 10/27/2022, 8:53 AM Please see Amion for pager number during day hours 7:00am-4:30pm

## 2022-10-28 ENCOUNTER — Inpatient Hospital Stay (HOSPITAL_COMMUNITY): Payer: 59

## 2022-10-28 DIAGNOSIS — J9601 Acute respiratory failure with hypoxia: Secondary | ICD-10-CM | POA: Diagnosis not present

## 2022-10-28 LAB — GLUCOSE, CAPILLARY
Glucose-Capillary: 108 mg/dL — ABNORMAL HIGH (ref 70–99)
Glucose-Capillary: 56 mg/dL — ABNORMAL LOW (ref 70–99)
Glucose-Capillary: 63 mg/dL — ABNORMAL LOW (ref 70–99)
Glucose-Capillary: 66 mg/dL — ABNORMAL LOW (ref 70–99)
Glucose-Capillary: 68 mg/dL — ABNORMAL LOW (ref 70–99)
Glucose-Capillary: 70 mg/dL (ref 70–99)
Glucose-Capillary: 72 mg/dL (ref 70–99)
Glucose-Capillary: 74 mg/dL (ref 70–99)
Glucose-Capillary: 74 mg/dL (ref 70–99)
Glucose-Capillary: 89 mg/dL (ref 70–99)
Glucose-Capillary: 89 mg/dL (ref 70–99)
Glucose-Capillary: 91 mg/dL (ref 70–99)
Glucose-Capillary: 98 mg/dL (ref 70–99)

## 2022-10-28 LAB — BASIC METABOLIC PANEL
Anion gap: 8 (ref 5–15)
BUN: 12 mg/dL (ref 6–20)
CO2: 25 mmol/L (ref 22–32)
Calcium: 7.8 mg/dL — ABNORMAL LOW (ref 8.9–10.3)
Chloride: 104 mmol/L (ref 98–111)
Creatinine, Ser: 0.7 mg/dL (ref 0.61–1.24)
GFR, Estimated: >60 mL/min (ref >60)
Glucose, Bld: 72 mg/dL (ref 70–99)
Potassium: 4.2 mmol/L (ref 3.5–5.1)
Sodium: 137 mmol/L (ref 135–145)

## 2022-10-28 LAB — CBC
HCT: 30.9 % — ABNORMAL LOW (ref 39.0–52.0)
Hemoglobin: 10.3 g/dL — ABNORMAL LOW (ref 13.0–17.0)
MCH: 29.2 pg (ref 26.0–34.0)
MCHC: 33.3 g/dL (ref 30.0–36.0)
MCV: 87.5 fL (ref 80.0–100.0)
Platelets: 198 10*3/uL (ref 150–400)
RBC: 3.53 MIL/uL — ABNORMAL LOW (ref 4.22–5.81)
RDW: 13.8 % (ref 11.5–15.5)
WBC: 6.4 10*3/uL (ref 4.0–10.5)
nRBC: 0 % (ref 0.0–0.2)

## 2022-10-28 LAB — HEPATIC FUNCTION PANEL
ALT: 16 U/L (ref 0–44)
AST: 42 U/L — ABNORMAL HIGH (ref 15–41)
Albumin: 1.6 g/dL — ABNORMAL LOW (ref 3.5–5.0)
Alkaline Phosphatase: 50 U/L (ref 38–126)
Bilirubin, Direct: 0.1 mg/dL (ref 0.0–0.2)
Indirect Bilirubin: 0.4 mg/dL (ref 0.3–0.9)
Total Bilirubin: 0.5 mg/dL (ref 0.3–1.2)
Total Protein: 5 g/dL — ABNORMAL LOW (ref 6.5–8.1)

## 2022-10-28 MED ORDER — POLYETHYLENE GLYCOL 3350 17 G PO PACK: 17.0000 g | PACK | Freq: Every day | ORAL | Status: DC | PRN

## 2022-10-28 MED ORDER — DEXTROSE 50 % IV SOLN: 12.5000 g | INTRAVENOUS | Status: AC

## 2022-10-28 MED ORDER — SENNOSIDES-DOCUSATE SODIUM 8.6-50 MG PO TABS: 1.0000 | ORAL_TABLET | Freq: Two times a day (BID) | ORAL | Status: DC | PRN

## 2022-10-28 MED ORDER — DEXTROSE 10 % IV SOLN: INTRAVENOUS | Status: DC

## 2022-10-28 MED ORDER — FUROSEMIDE 10 MG/ML IJ SOLN
20.0000 mg | Freq: Once | INTRAMUSCULAR | Status: AC
  Administered 2022-10-28: 20 mg via INTRAVENOUS
  Filled 2022-10-28: qty 2

## 2022-10-28 MED ORDER — GUAIFENESIN 100 MG/5ML PO LIQD
5.0000 mL | ORAL | Status: DC
  Administered 2022-10-28 – 2022-11-07 (×56): 5 mL
  Filled 2022-10-28: qty 5
  Filled 2022-10-28: qty 10
  Filled 2022-10-28 (×4): qty 5
  Filled 2022-10-28: qty 10
  Filled 2022-10-28: qty 5
  Filled 2022-10-28 (×2): qty 10
  Filled 2022-10-28 (×4): qty 5
  Filled 2022-10-28: qty 10
  Filled 2022-10-28: qty 5
  Filled 2022-10-28 (×2): qty 10
  Filled 2022-10-28 (×2): qty 5
  Filled 2022-10-28: qty 10
  Filled 2022-10-28 (×3): qty 5
  Filled 2022-10-28 (×3): qty 10
  Filled 2022-10-28 (×2): qty 5
  Filled 2022-10-28: qty 10
  Filled 2022-10-28: qty 5
  Filled 2022-10-28 (×2): qty 10
  Filled 2022-10-28: qty 5
  Filled 2022-10-28 (×2): qty 10
  Filled 2022-10-28: qty 5
  Filled 2022-10-28 (×3): qty 10
  Filled 2022-10-28: qty 5
  Filled 2022-10-28: qty 10
  Filled 2022-10-28 (×2): qty 5
  Filled 2022-10-28: qty 10
  Filled 2022-10-28 (×2): qty 5
  Filled 2022-10-28: qty 10
  Filled 2022-10-28 (×6): qty 5
  Filled 2022-10-28: qty 10

## 2022-10-28 MED ORDER — LORAZEPAM 2 MG/ML IJ SOLN
1.0000 mg | INTRAMUSCULAR | Status: AC | PRN
  Administered 2022-10-30 – 2022-10-31 (×2): 1 mg via INTRAVENOUS
  Filled 2022-10-28 (×2): qty 1

## 2022-10-28 MED ORDER — VANCOMYCIN HCL 750 MG/150ML IV SOLN
750.0000 mg | Freq: Two times a day (BID) | INTRAVENOUS | Status: AC
  Administered 2022-10-28 – 2022-11-02 (×10): 750 mg via INTRAVENOUS
  Filled 2022-10-28 (×12): qty 150

## 2022-10-28 MED ORDER — DEXTROSE 50 % IV SOLN
12.5000 g | INTRAVENOUS | Status: AC
  Administered 2022-10-28: 12.5 g via INTRAVENOUS

## 2022-10-28 MED ORDER — DEXTROSE 50 % IV SOLN
INTRAVENOUS | Status: AC
  Administered 2022-10-28: 12.5 g via INTRAVENOUS
  Filled 2022-10-28: qty 50

## 2022-10-28 MED ORDER — SODIUM CHLORIDE 3 % IN NEBU
4.0000 mL | INHALATION_SOLUTION | Freq: Two times a day (BID) | RESPIRATORY_TRACT | Status: AC
  Administered 2022-10-28 – 2022-10-30 (×6): 4 mL via RESPIRATORY_TRACT
  Filled 2022-10-28 (×6): qty 4

## 2022-10-28 MED ORDER — DEXTROSE 50 % IV SOLN
INTRAVENOUS | Status: AC
  Filled 2022-10-28: qty 50

## 2022-10-28 NOTE — Progress Notes (Signed)
[place] Progress Note Patient Name: [name] DOB: 1966/12/24 MRN: [id number]   Date of Service  10/28/2022  HPI/Events of Note  Agitation - Ativan has been effective in the past.   eICU Interventions  Plan: Ativan 1 mg IV Q 4 hours PRN agitation for 2 doses.      Intervention Category Major Interventions: Delirium, psychosis, severe agitation - evaluation and management  Jamarr Treinen [place] 10/28/2022, 2:55 AM

## 2022-10-28 NOTE — Progress Notes (Signed)
Pharmacy Antibiotic Note-Follow Up  [name] is a 56 y.o. male admitted on 10/21/2022 with respiratory failure. Pharmacy has been consulted for Vancomycin dosing (10/25-10/26; 10/28>>). 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 S/p bedside I&D of abdominal wound on 10/30. SCr stable at 0.7.  Levels drawn at steady state on 10/30-10/31 - vancomycin peak 29 and trough 17 (peak drawn early). Calculated AUC slightly high at 559.  Plan: Adjust to vancomycin 750mg  IV q12h for new estimated AUC of 419 (goal 400-550), as AUC on current dose slightly high and expect accumulation may occur with prolonged dosing Monitor clinical progress, c/s, renal function Stop date planned for 11/6 (7 days post-beside I&D) - check repeat vancomycin levels as needed   Height: 5\' 8"  (172.7 cm) Weight: 61.7 kg (136 lb 0.4 oz) IBW/kg (Calculated) : 68.4  Temp (24hrs), Avg:98 F (36.7 C), Min:97.5 F (36.4 C), Max:98.7 F (37.1 C)  Recent Labs  Lab 10/21/22 1827 10/21/22 1833 10/21/22 2148 10/22/22 0454 10/22/22 0741 10/22/22 2216 10/23/22 0431 10/24/22 0600 10/25/22 0702 10/26/22 0628 10/26/22 2311 10/27/22 1037 10/28/22 0223  WBC  --    < >  --    < >  --   --  11.2* 11.7* 11.2* 10.5  --   --  6.4  CREATININE  --    < >  --    < >  --  0.88 0.76  --   --  0.68  --  0.78 0.70  LATICACIDVEN 4.8*  --  3.0*  --  1.7  --   --   --   --   --   --   --   --   VANCOTROUGH  --   --   --   --   --   --   --   --   --   --   --  17  --   VANCOPEAK  --   --   --   --   --   --   --   --   --   --  29*  --   --    < > = values in this interval not displayed.     Estimated Creatinine Clearance: 91.1 mL/min (by C-G formula based on SCr of 0.7 mg/dL).    Allergies  Allergen Reactions   Ativan [Lorazepam] Other (See Comments)    Tolerated 09/2022 admission   Latex Dermatitis and Other (See Comments)    "Allergic," per  MAR   Morphine And Related Other (See Comments)    Reaction not listed on MAR   Other Other (See Comments)    Soy Sauce  Reaction not listed on MAR   Penicillins Other (See Comments)    Patient tolerated cephalosporins in past. "Allergic," per MAR.  Has patient had a PCN reaction causing immediate rash, facial/tongue/throat swelling, SOB or lightheadedness with hypotension: unknown Has patient had a PCN reaction causing severe rash involving mucus membranes or skin necrosis: unknown Has patient had a PCN reaction that required hospitalization: no Has patient had a PCN reaction occurring within the last 10 years: yes If all of the above answe   Pyridium [Phenazopyridine Hcl] Other (See Comments)    Reaction not listed on MAR   Soy Allergy Other (See Comments)    Reaction not listed on MAR   Soybean (Diagnostic) Other (See Comments)  Reaction not listed no MAR    Arturo Morton, PharmD, BCPS Please check AMION for all [place] contact numbers Clinical Pharmacist 10/28/2022 2:19 PM

## 2022-10-28 NOTE — Progress Notes (Signed)
Occupational Therapy Treatment Patient Details Name: [name] MRN: [id number] DOB: [birth year] Today's Date: 10/28/2022   History of present illness Pt is a 56 y.o. male who presented 10/21/22 with acute hypoxic respiratory failure in the setting of bilateral lower lobe pneumonia. ETT 10/25-10/28. Found to have large abscess on the R abdominal wall and a moderate to large right lumbar hernia that was not incarcerated, may plan for surgery 10/30. PMH: cardiac arrest with anoxic brain injury, Addison disease, afib, dementia, dysphagia, UTIs, HLD, hypothyroidism, seizure   OT comments  Patient seen in conjunction with PT to increase out of bed tolerance, and advance transfer status.  Patient more alert, and trying to initiate supine to sit and sit to stand.  Still needing +2 for assist and safety.  OT to follow in the acute setting with a return to SNF recommended.     Recommendations for follow up therapy are one component of a multi-disciplinary discharge planning process, led by the attending physician.  Recommendations may be updated based on patient status, additional functional criteria and insurance authorization.    Follow Up Recommendations  Skilled nursing-short term rehab (<3 hours/day)    Assistance Recommended at Discharge Frequent or constant Supervision/Assistance  Patient can return home with the following  Two people to help with walking and/or transfers;Two people to help with bathing/dressing/bathroom;Assistance with feeding;Assistance with cooking/housework;Direct supervision/assist for medications management;Assist for transportation;Direct supervision/assist for financial management;Help with stairs or ramp for entrance   Equipment Recommendations  None recommended by OT    Recommendations for Other Services      Precautions / Restrictions Precautions Precautions: Fall Precaution Comments: posey; bil mittens; watch BP and SpO2 Restrictions Weight Bearing  Restrictions: No       Mobility Bed Mobility   Bed Mobility: Supine to Sit, Sit to Supine     Supine to sit: Mod assist, +2 for physical assistance Sit to supine: Mod assist, +2 for physical assistance   General bed mobility comments: patient attempting to assist moving legs and sitting up Patient Response: Cooperative  Transfers Overall transfer level: Needs assistance Equipment used: 2 person hand held assist Transfers: Sit to/from Stand Sit to Stand: Min assist, Mod assist           General transfer comment: multiple attempts.     Balance Overall balance assessment: Needs assistance   Sitting balance-Leahy Scale: Poor Sitting balance - Comments: Tends to lean to R,  needing modA for sitting balance at times. Postural control: Right lateral lean, Posterior lean Standing balance support: Bilateral upper extremity supported Standing balance-Leahy Scale: Poor                             ADL either performed or assessed with clinical judgement   ADL                                              Extremity/Trunk Assessment Upper Extremity Assessment Upper Extremity Assessment: RUE deficits/detail;LUE deficits/detail RUE Deficits / Details: minimal sublux to shoulder.  MM trace noted, but not using arm functionally. RUE: Shoulder pain with ROM RUE Coordination: decreased fine motor;decreased gross motor LUE Deficits / Details: Able to open and close hands, and touched nose to command.   Lower Extremity Assessment Lower Extremity Assessment: Defer to PT evaluation   Cervical / Trunk Assessment Cervical /  Trunk Assessment: Kyphotic;Other exceptions Cervical / Trunk Exceptions: R head turn perference                      Cognition Arousal/Alertness: Awake/alert Behavior During Therapy: Flat affect Overall Cognitive Status: History of cognitive impairments - at baseline                                 General  Comments: Pt with delayed reponses at times.  Fairly consistent with following simple cueing.  Nods head yes/no, appeared to be fairly consistent.  Closing eyes at times, but opens eyes to name.        Exercises      Shoulder Instructions       General Comments  BP stable throughout    Pertinent Vitals/ Pain       Pain Assessment Pain Assessment: Faces Faces Pain Scale: Hurts little more Pain Location: with initial attempted ROM to R shoulder Pain Descriptors / Indicators: Grimacing Pain Intervention(s): Monitored during session                                                          Frequency  Min 2X/week        Progress Toward Goals  OT Goals(current goals can now be found in the care plan section)  Progress towards OT goals: Progressing toward goals  Acute Rehab OT Goals OT Goal Formulation: Patient unable to participate in goal setting Time For Goal Achievement: 11/09/22 Potential to Achieve Goals: [place] Discharge plan remains appropriate    Co-evaluation                 AM-PAC OT "6 Clicks" Daily Activity     Outcome Measure   Help from another person eating meals?: Total Help from another person taking care of personal grooming?: A Lot Help from another person toileting, which includes using toliet, bedpan, or urinal?: Total Help from another person bathing (including washing, rinsing, drying)?: Total Help from another person to put on and taking off regular upper body clothing?: Total Help from another person to put on and taking off regular lower body clothing?: Total 6 Click Score: 7    End of Session Equipment Utilized During Treatment: Oxygen  OT Visit Diagnosis: Other abnormalities of gait and mobility (R26.89);Muscle weakness (generalized) (M62.81);Pain;Other symptoms and signs involving cognitive function Pain - Right/Left: Right Pain - part of body: Shoulder   Activity Tolerance Patient tolerated  treatment well   Patient Left in bed;with call bell/phone within reach;with nursing/sitter in room;with restraints reapplied   Nurse Communication Mobility status        Time: [id number] OT Time Calculation (min): 24 min  Charges: OT General Charges $OT Visit: 1 Visit OT Treatments $Self Care/Home Management : 8-22 mins  10/28/2022  RP, OTR/L  Acute Rehabilitation Services  Office:  [phone]   Metta Clines 10/28/2022, 12:15 PM

## 2022-10-28 NOTE — Progress Notes (Signed)
NAME:  [name], MRN:  [id number], DOB:  08/26/1966, LOS: 7 ADMISSION DATE:  10/21/2022, CONSULTATION DATE:  10/25 REFERRING MD:  Maylon Peppers, EDP CHIEF COMPLAINT:  Hypoxia   History of Present Illness:  56 year old man admitted from [facility] for hypoxia and unresponsiveness. Limited information available. May have aspirated. Hypoxic to 70's and agitated in ED so intubated.    Chronic SNF resident due to demential and anoxic brain injury, gait difficulties. May be bed bound.    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  He eventually recovered but had subsequent anoxic brain injury.  He has been institution bound since approximately 2007.  His sister states that he has had a significant functional decline in the past 2 years since their mother passed away.  Pertinent  Medical History   Past Medical History:  Diagnosis Date   Addison disease ([place])    Anoxic brain injury ([place])    TBI   Anxiety    Aspiration pneumonia (HCC)    Atrial fibrillation ([place])    Chronic constipation 11/23/2011   Dementia (HCC)    Dysphagia    Encephalopathy    Frontal lobe syndrome    Gait abnormality    freq falls   Glaucoma    Glucocorticoid deficiency ([place])    History of heart attack    History of recurrent UTIs    Hyperlipemia    Hypothyroidism    Mental disorder    Myocardial infarction ([place])    Pneumoperitoneum of unknown etiology 12/21/2011   Quadriplegia ([place])    Reflux    Seizure disorder ([place])    Weakness of both legs     Significant Hospital Events: Including procedures, antibiotic start and stop dates in addition to other pertinent events   10/25 - admitted via ED. 10/27: A-fib overnight,  started amiodarone drip 10/28: Extubated to [place] 10/28: Abd U/S 6.7 x 6.8 cm hernia versus hematoma 10/28: CT A/P Enlarging 10.7 x 8.6 x 7.5 cm fluid collection 10/30: Bedside I&D by general surgery 11/1: CXR worsening aeration with new bilateral pleural effusions, L>R bibasilar airspace opacities  Interim History / Subjective:  Hypoglycemic overnight and this morning with CBGs persistently in the 50s to 70s. Drop in O2 sats due to congestion and inability to clear secretions this morning Reported some abdominal pain overnight, requiring Tylenol He has no complaints this morning  Objective   Blood pressure (!) 98/22, pulse 60, temperature 97.7 F (36.5 C), temperature source Oral, resp. rate (!) 22, height 5\' 8"  (1.727 m), weight 61.7 kg, SpO2 93 %.        Intake/Output Summary (Last 24 hours) at 10/28/2022 0731 Last data filed at 10/28/2022 0600 Gross per 24 hour  Intake 3158.4 ml  Output 1370 ml  Net 1788.4 ml   Filed Weights   10/25/22 0430 10/26/22 0500 10/27/22 0500  Weight: 72 kg 68.4 kg 61.7 kg   Examination: General: Chronically ill, cachectic middle-age man laying in bed. HENT: moist mucous membranes Lungs: On 4 L [place].  Congested lung sounds anteriorly.  No WOB. Cardiovascular: Regular rate and rhythm.  No murmurs, rubs or gallops.   Abdomen: Soft.  NT/ND. Abdominal wall hernia, abdominal wall  fluctuant mass covered with dressing. Extremities: Warm and dry. Neuro: Alert and awake. Calm and interactive. Moving all extremities. GU: Condom cath in place  K+ 4.2, creatinine 0.7, calcium 7.8, Corrected calcium 9.7 WBC 6.4, Hgb 10.3, platelet 198 CBGs 50s-90s CXR worsening aeration with new bilateral pleural effusions, L>R bibasilar airspace opacities  Resolved Hospital Problem list   AKI Respiratory acidosis Afib  Assessment & Plan:  Septic shock 2/2 pneumonia HCAP Off of pressors since yesterday.  BP has been stable with SBP in the 100s to 130s.  Remains afebrile  with no white count.  Repeat checks x-ray showing new bilateral pleural effusions. -Discontinue IV fluids -IV Lasix 20 mg x 1 dose -Continue vancomycin -Continue midodrine 10 mg q8h -Transfer to progressive bed  Acute respiratory failure with hypoxia (HCC) Respiratory acidosis, resolved Extubated to 3 LNC on 10/28.  Briefly required increased to 10 L [place] due to congestion and inability to clear secretions.  Weaned down to 4 L [place].  Congested lung sounds on auscultation, but no increased work of breathing. -Start hypertonic 3% nebulized solution -Start guaifenesin per tube to help loosen secretions -Continue O2 supplementation, wean for O2 sat >92% -Discontinue scopolamine patch  Hypoglycemia Patient with no history of diabetes become persistently hypoglycemic in the last couple days.  On chart review, patient has a history of hypoglycemia dating back to January of this year.  CT A/P during this admission did not show any insulinoma or pancreatic mass.  Cortisol levels are within normal. No renal or hepatic dysfunction. Hyperglycemia likely secondary to sepsis/critical illness. -Start D10 LR at 20 cc/h -Pending MBS to start diet -Consider further work up if not improved after diet or patient becomes symptomatic.   R abdominal wall abscess Hx of abdominal hernia CT A/P showed enlarging 10.7 x 8.6 x 7.5 cm abnormal fluid collection concerning for possible abscess.  Successful bedside I&D by CCS on 10/30.  Mild tenderness around the incision site but no complications -CCS following, appreciate recs -Continue vancomycin, de-escalate based on wound culture and sensitivities -Dressing changes per CCS   New onset A-fib, resolved Sinus brady Remains in NSR but has had intermittent bradycardia for the last 48 hr. CHADVAs score of 2. No electrolyte abnormalities. TSH within normal limits. Patient is at a significant risk of falls at baseline.  Patient's risk of bleeding outweighs his risk of stroke  so we will hold off on long-term anticoagulation.  HR has been stable in the 60s. -Telemetry -Lovenox for DVT prophylaxis -Trend and replete electrolytes   Cardiomyopathy, ischemic Last TTE 09/2017 showed EF 60 to 65% and akinesis of the basal inferior myocardium. Repeat TTE showed EF 50 to 55%, with global hypokinesis but no valvular abnormalities. -Monitor and replete electrolytes -Strict I&O's  Hypothyroidism Repeat stable at TSH 5.5. -Continue Synthroid to 225 mcg daily  Protein calorie malnutrition ([place]) -Cortrak placed on 10/30.  Evaluated by SLP this morning who recommended dysphagia 3 diet with plan for pulm swallow.  -Follow-up MBS -Dysphagia 3 diet -Continue TF for now  Hx of anoxic brain injury, TBI Seizure disorder -Continue home Haldol per tube -Continue valproic acid per tube -Continue BID Klonopin and PRN ativan for agitation  Best Practice (right click and "Reselect all SmartList Selections" daily)   Diet/type: tubefeeds and dysphagia diet (see orders) DVT prophylaxis: SCD, lovenox GI prophylaxis: PPI Lines: N/A Foley:  N/A Code Status:  limited Last date of multidisciplinary goals of care discussion  [Pending ]  Labs   CBC: Recent  Labs  Lab 10/21/22 1833 10/21/22 1926 10/23/22 0431 10/24/22 0600 10/25/22 0702 10/25/22 2241 10/26/22 0628 10/28/22 0223  WBC 13.6*   < > 11.2* 11.7* 11.2*  --  10.5 6.4  NEUTROABS 11.9*  --  8.3*  --   --   --   --   --   HGB 14.3   < > 9.8* 10.7* 10.1* 8.8* 11.6* 10.3*  HCT 45.1   < > 30.4* 33.1* 31.4* 26.0* 35.6* 30.9*  MCV 92.0   < > 90.5 88.7 88.2  --  86.4 87.5  PLT 301   < > 186 232 223  --  231 198   < > = values in this interval not displayed.    Basic Metabolic Panel: Recent Labs  Lab 10/21/22 2123 10/21/22 2129 10/22/22 0454 10/22/22 1614 10/22/22 2216 10/23/22 0431 10/24/22 0600 10/25/22 2241 10/26/22 0628 10/27/22 1037 10/28/22 0223  NA  --    < > 141  --  142 139  --  131* 136 138 137  K   --    < > 4.3  --  4.6 3.8  --  4.0 4.2 4.1 4.2  CL  --   --  107  --  105 108  --   --  103 105 104  CO2  --   --  22  --  23 24  --   --  22 24 25   GLUCOSE  --   --  87  --  107* 113*  --   --  90 98 72  BUN  --   --  24*  --  22* 18  --   --  10 12 12   CREATININE 1.28*  --  1.05  --  0.88 0.76  --   --  0.68 0.78 0.70  CALCIUM  --   --  8.4*  --  8.6* 8.2*  --   --  8.1* 7.8* 7.8*  MG 1.7  --  1.7 1.6* 1.6* 1.7 2.2  --   --   --   --   PHOS 3.5  --  4.7* 3.8  --  3.1  --   --   --   --   --    < > = values in this interval not displayed.   GFR: Estimated Creatinine Clearance: 91.1 mL/min (by C-G formula based on SCr of 0.7 mg/dL). Recent Labs  Lab 10/21/22 1827 10/21/22 1833 10/21/22 2148 10/22/22 0454 10/22/22 0741 10/23/22 0431 10/24/22 0600 10/25/22 0702 10/26/22 0628 10/28/22 0223  WBC  --    < >  --    < >  --    < > 11.7* 11.2* 10.5 6.4  LATICACIDVEN 4.8*  --  3.0*  --  1.7  --   --   --   --   --    < > = values in this interval not displayed.    Liver Function Tests: Recent Labs  Lab 10/21/22 1833 10/23/22 0431  AST 19 11*  ALT 7 6  ALKPHOS 56 40  BILITOT 0.4 0.3  PROT 7.7 5.3*  ALBUMIN 2.5* 1.6*   No results for input(s): "LIPASE", "AMYLASE" in the last 168 hours. No results for input(s): "AMMONIA" in the last 168 hours.  ABG    Component Value Date/Time   PHART 7.36 10/23/2022 0535   PCO2ART 47 10/23/2022 0535   PO2ART 91 10/23/2022 0535   HCO3 24.6 10/25/2022 2241   TCO2 25 10/25/2022  2241   ACIDBASEDEF 4.0 (H) 10/22/2022 0044   O2SAT 46 10/25/2022 2241     Coagulation Profile: Recent Labs  Lab 10/21/22 1833  INR 1.1    Cardiac Enzymes: No results for input(s): "CKTOTAL", "CKMB", "CKMBINDEX", "TROPONINI" in the last 168 hours.  HbA1C: Hgb A1c MFr Bld  Date/Time Value Ref Range Status  11/24/2011 05:15 AM 4.6 <5.7 % Final    Comment:    (NOTE)                                                                       According to the  ADA Clinical Practice Recommendations for 2011, when HbA1c is used as a screening test:  >=6.5%   Diagnostic of Diabetes Mellitus           (if abnormal result is confirmed) 5.7-6.4%   Increased risk of developing Diabetes Mellitus References:Diagnosis and Classification of Diabetes Mellitus,Diabetes Care,2011,34(Suppl 1):S62-S69 and Standards of Medical Care in         Diabetes - 2011,Diabetes [id number] (Suppl 1):S11-S61.  10/05/2011 05:10 AM 5.0 <5.7 % Final    Comment:    (NOTE)                                                                       According to the ADA Clinical Practice Recommendations for 2011, when HbA1c is used as a screening test:  >=6.5%   Diagnostic of Diabetes Mellitus           (if abnormal result is confirmed) 5.7-6.4%   Increased risk of developing Diabetes Mellitus References:Diagnosis and Classification of Diabetes Mellitus,Diabetes [id number] 1):S62-S69 and Standards of Medical Care in         Diabetes - 2011,Diabetes [id number] (Suppl 1):S11-S61.    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   Surgical History:   Past Surgical History:  Procedure Laterality Date   DEBRIDEMENT OF ABDOMINAL WALL ABSCESS  01/26/2018   HERNIA MESH REMOVAL   01/26/2018   Infected mesh - removed w I&D abd wall abscess   IR GENERIC HISTORICAL  10/20/2016   IR [place] GASTRO/COLONIC TUBE PERCUT W/FLUORO 10/20/2016 Arne Cleveland, MD [facility]INTERV [facility]   IR REPLACE G-TUBE SIMPLE WO FLUORO  01/28/2018   IR [place] GASTRO/COLONIC TUBE PERCUT W/FLUORO  04/21/2017   IR [place] GASTRO/COLONIC TUBE PERCUT W/FLUORO  07/22/2017   IR REPLC GASTRO/COLONIC TUBE PERCUT W/FLUORO  11/24/2017  IR REPLC GASTRO/COLONIC TUBE PERCUT W/FLUORO  08/12/2020   IRRIGATION AND DEBRIDEMENT ABSCESS N/A 01/26/2018   Procedure: IRRIGATION AND DEBRIDEMENT ABDOMINAL WALL ABSCESS WITH REMOVAL OF INFECTED MESH;  Surgeon: Michael Boston, MD;  Location: [facility] ORS;  Service: General;  Laterality: N/A;   NEPHRECTOMY  unknown   PEG TUBE PLACEMENT       Social History:   reports that he has never smoked. He has never used smokeless tobacco. He reports that he does not drink alcohol and does not use drugs.   Family History:  His family history is not on file.   Allergies Allergies  Allergen Reactions   Ativan [Lorazepam] Other (See Comments)    Tolerated 09/2022 admission   Latex Dermatitis and Other (See Comments)    "Allergic," per MAR   Morphine And Related Other (See Comments)    Reaction not listed on MAR   Other Other (See Comments)    Soy Sauce  Reaction not listed on MAR   Penicillins Other (See Comments)    Patient tolerated cephalosporins in past. "Allergic," per MAR.  Has patient had a PCN reaction causing immediate rash, facial/tongue/throat swelling, SOB or lightheadedness with hypotension: unknown Has patient had a PCN reaction causing severe rash involving mucus membranes or skin necrosis: unknown Has patient had a PCN reaction that required hospitalization: no Has patient had a PCN reaction occurring within the last 10 years: yes If all of the above answe   Pyridium [Phenazopyridine Hcl] Other (See Comments)    Reaction not listed on MAR   Soy Allergy Other (See Comments)     Reaction not listed on MAR   Soybean (Diagnostic) Other (See Comments)    Reaction not listed no MAR     Home Medications  Prior to Admission medications   Medication Sig Start Date End Date Taking? Authorizing Provider  acetaminophen (TYLENOL) 325 MG tablet Take 650 mg by mouth every 4 (four) hours as needed (pain).   Yes [provider]  albuterol (ACCUNEB) 0.63 MG/3ML nebulizer solution Take 1 ampule by nebulization every 6 (six) hours as needed for wheezing or shortness of breath (COPD).   Yes [provider]  aspirin 81 MG chewable tablet Chew 81 mg by mouth every morning. 0900   Yes [provider]  calcium carbonate (TUMS - DOSED IN MG ELEMENTAL CALCIUM) 500 MG chewable tablet Chew 1 tablet by mouth every 12 (twelve) hours as needed for indigestion or heartburn.   Yes [provider]  Carboxymeth-Glycerin-Polysorb (REFRESH OPTIVE ADVANCED) 0.5-1-0.5 % SOLN Place 1 drop into both eyes 3 (three) times daily. 0900, 1400, 2100   Yes [provider]  clonazePAM (KLONOPIN) 0.5 MG tablet Take 0.5 mg by mouth 2 (two) times daily. 0830 and 1630   Yes [provider]  divalproex (DEPAKOTE SPRINKLE) 125 MG capsule Take 500 mg by mouth 3 (three) times daily. 0830, 1230, and 2030 07/18/13  Yes [provider]  docusate sodium (COLACE) 100 MG capsule Take 100 mg by mouth every morning. 0800   Yes [provider]  haloperidol (HALDOL) 5 MG tablet Take 5 mg by mouth 2 (two) times daily. 0830 and 2030   Yes [provider]  levothyroxine (SYNTHROID) 200 MCG tablet Take 200 mcg by mouth daily before breakfast. Take with a 25 mcg tablet for a total dose of 225 mcg every morning 0630   Yes [provider]  levothyroxine (SYNTHROID) 25 MCG tablet Take 25 mcg by mouth daily before breakfast.  Give 30mcg with current 200 mcg dose for 225 mcg 0630   Yes [provider]  Magnesium Hydroxide (MILK OF MAGNESIA PO) Take  30 mLs by mouth daily as needed (constipation).   Yes [provider]  omeprazole (PRILOSEC) 20 MG capsule Take 20 mg by mouth at bedtime.   Yes [provider]  polyethylene glycol (MIRALAX / GLYCOLAX) packet Take 17 g by mouth 2 (two) times daily. 0900 and 2100   Yes [provider]  senna (SENOKOT) 8.6 MG tablet Take 2 tablets by mouth 2 (two) times daily. 0900 and 2000   Yes [provider]  sertraline (ZOLOFT) 50 MG tablet Take 75 mg by mouth every morning. 0900   Yes [provider]  cephALEXin (KEFLEX) 500 MG capsule Take 1 capsule (500 mg total) by mouth 4 (four) times daily. Patient not taking: Reported on 05/24/2022 05/15/22   Daleen Bo, MD  fosfomycin (MONUROL) 3 g PACK Take 3 g by mouth See admin instructions. Give 3 g packet by mouth one time a day for UTI for one day  0900    [provider]  pantoprazole (PROTONIX) 40 MG tablet Take 40 mg by mouth daily. 0700    [provider]  pantoprazole sodium (PROTONIX) 40 mg/20 mL PACK Take 40 mg by mouth every morning. Patient not taking: Reported on 05/24/2022 11/30/11   Ernest Haber, MD  traMADol (ULTRAM) 50 MG tablet Take 50 mg by mouth every 6 (six) hours as needed (pain).    [provider]     Critical care time: 64

## 2022-10-28 NOTE — Progress Notes (Signed)
Patient ID: [name], male   DOB: Jan 02, 1966, 56 y.o.   MRN: [id number]      Subjective: Does not offer complaint. ROS negative except as listed above. Objective: Vital signs in last 24 hours: Temp:  [97.7 F (36.5 C)-98.7 F (37.1 C)] 97.7 F (36.5 C) (11/01 0745) Pulse Rate:  [53-196] 61 (11/01 0802) Resp:  [12-35] 28 (11/01 0802) BP: (76-132)/(22-119) 120/89 (11/01 0802) SpO2:  [88 %-100 %] 96 % (11/01 0802) Last BM Date : 10/27/22  Intake/Output from previous day: 10/31 0701 - 11/01 0700 In: 3158.4 [P.O.:120; I.V.:1188.5; NG/GT:1450; IV Piggyback:399.9] Out: 1370 [Urine:1050; Stool:320] Intake/Output this shift: No intake/output data recorded.  Night RN just changed dressing at 0400 - reports drainage more SS now  Lab Results: CBC  Recent Labs    10/26/22 0628 10/28/22 0223  WBC 10.5 6.4  HGB 11.6* 10.3*  HCT 35.6* 30.9*  PLT 231 198   BMET Recent Labs    10/27/22 1037 10/28/22 0223  NA 138 137  K 4.1 4.2  CL 105 104  CO2 24 25  GLUCOSE 98 72  BUN 12 12  CREATININE 0.78 0.70  CALCIUM 7.8* 7.8*   PT/INR No results for input(s): "LABPROT", "INR" in the last 72 hours. ABG Recent Labs    10/25/22 2241  HCO3 24.6    Studies/Results: DG Abd Portable 1V  Result Date: 10/26/2022 CLINICAL DATA:  Feeding tube placement EXAM: PORTABLE ABDOMEN - 1 VIEW COMPARISON:  06/02/2021 FINDINGS: Tip of feeding tube is seen in the distal antrum of the stomach. Lower abdomen and pelvis are not included. Bowel gas pattern in the visualized portions of abdomen is unremarkable. There are multiple surgical clips in right mid abdomen. Right hemidiaphragm is elevated. Left diaphragmatic margin is indistinct, possibly suggesting small pleural effusion. IMPRESSION: Tip of feeding tube is seen in the distal antrum of the stomach. Electronically Signed   By: Elmer Picker M.D.   On: 10/26/2022 10:29    Anti-infectives: Anti-infectives (From admission, onward)     Start     Dose/Rate Route Frequency Ordered Stop   10/27/22 1315  vancomycin (VANCOCIN) IVPB 1000 mg/200 mL premix        1,000 mg 200 mL/hr over 60 Minutes Intravenous Every 12 hours 10/27/22 1224     10/27/22 1230  vancomycin (VANCOCIN) IVPB 1000 mg/200 mL premix  Status:  Discontinued        1,000 mg 200 mL/hr over 60 Minutes Intravenous Every 12 hours 10/27/22 1222 10/27/22 1223   10/24/22 2200  vancomycin (VANCOCIN) IVPB 1000 mg/200 mL premix  Status:  Discontinued        1,000 mg 200 mL/hr over 60 Minutes Intravenous Every 12 hours 10/24/22 1138 10/27/22 1222   10/24/22 1230  vancomycin (VANCOREADY) IVPB 1250 mg/250 mL        1,250 mg 166.7 mL/hr over 90 Minutes Intravenous  Once 10/24/22 1138 10/24/22 1431   10/23/22 1130  cefTRIAXone (ROCEPHIN) 2 g in sodium chloride 0.9 % 100 mL IVPB        2 g 200 mL/hr over 30 Minutes Intravenous Every 24 hours 10/23/22 1041 10/26/22 1324   10/22/22 2200  ceFEPIme (MAXIPIME) 2 g in sodium chloride 0.9 % 100 mL IVPB  Status:  Discontinued        2 g 200 mL/hr over 30 Minutes Intravenous Every 8 hours 10/22/22 1559 10/23/22 1041   10/22/22 0900  vancomycin (VANCOREADY) IVPB 750 mg/150 mL  Status:  Discontinued  750 mg 150 mL/hr over 60 Minutes Intravenous Every 12 hours 10/21/22 2041 10/22/22 1559   10/22/22 0700  ceFEPIme (MAXIPIME) 2 g in sodium chloride 0.9 % 100 mL IVPB  Status:  Discontinued        2 g 200 mL/hr over 30 Minutes Intravenous Every 12 hours 10/21/22 2041 10/22/22 1559   10/21/22 1900  vancomycin (VANCOREADY) IVPB 1750 mg/350 mL        1,750 mg 175 mL/hr over 120 Minutes Intravenous  Once 10/21/22 1854 10/21/22 2125   10/21/22 1900  ceFEPIme (MAXIPIME) 2 g in sodium chloride 0.9 % 100 mL IVPB        2 g 200 mL/hr over 30 Minutes Intravenous  Once 10/21/22 1854 10/21/22 1924   10/21/22 1900  azithromycin (ZITHROMAX) 500 mg in sodium chloride 0.9 % 250 mL IVPB  Status:  Discontinued        500 mg 250 mL/hr over 60  Minutes Intravenous Every 24 hours 10/21/22 1854 10/23/22 1041   10/21/22 1845  vancomycin (VANCOCIN) IVPB 1000 mg/200 mL premix  Status:  Discontinued        1,000 mg 200 mL/hr over 60 Minutes Intravenous  Once 10/21/22 1835 10/21/22 1851   10/21/22 1845  levofloxacin (LEVAQUIN) IVPB 750 mg  Status:  Discontinued        750 mg 100 mL/hr over 90 Minutes Intravenous  Once 10/21/22 1835 10/21/22 1849       Assessment/Plan: Right abdominal wall abscess History of infected mesh - s/p bedside I&D 10/30 - continue IV antibiotics, culture re-incubated - daily packing changes with 1/4 inch iodoform. Dry dressing over top, change PRN saturation  ID - currently vancomycin FEN - TF VTE - lovenox Foley - none  Right lumbar hernia Acute respiratory failure - extubated 10/28 but increased secretions and O2 requirements today - per CCM Bradycardia  Sepsis Pneumonia  A fib - resolved H/o adrenal insufficiency Hx anoxic brain injury, TBI   LOS: 7 days    Violeta Gelinas, MD, MPH, FACS Trauma & General Surgery Use [email or web address] to contact on call provider  10/28/2022

## 2022-10-28 NOTE — Progress Notes (Signed)
CSW called legal guardian [name] regarding DC plan, she answered but was at an appt, said she would call back once completed. Lurline Idol, MSW, LCSW 11/1/20234:30 PM

## 2022-10-28 NOTE — Progress Notes (Signed)
Modified Barium Swallow Progress Note  Patient Details  Name: [name] MRN: [id number] Date of Birth: 07-24-1966  Today's Date: 10/28/2022  Modified Barium Swallow completed.  Full report located under Chart Review in the Imaging Section.  Brief recommendations include the following:  Clinical Impression  Patient presenting with a mild oral dysphagia and a mild-moderate pharyngeal phase dysphagia with impact from cognition leading to him being very impulsive with PO intake. Patient tested with the following barium consistencies, nectar thick, honey thick, puree and soft solid. He exhibited mildly delayed anterior to posterior transit of liquids and solids as well as decreased mastication with soft solids. During pharyngeal phase, patient exhibited swallow initation delay to level of vallecular sinus with all tested bolus consistencies. Only trace amount of vallecular residuals observed s/p initial swallow with liquids and solids. With nectar thick liquids, patient had two instances of silent aspiration which occured during the swallow, one time being trace amount and the next time a moderate amount. He did not exhibit a cough response until 1-2 minutes after the aspiration event.  These aspiration events occured when patient drinking nectar thick liquid through straw and when tilting his head back taking large cup sip. With small, controlled cup sips, he exhibited only trace penetration. With honey thick liquids, patient did not exhibit any instances of penetration or aspiration and he was able to draw honey thick liquid through straw. SLP tested him on small and large sips and no penetration or aspiration observed. Patient exhibited mastication delays with soft solids and as he is very impulsive with PO intake, SLP recommending to continue with honey thick liquids but to downgrade to Dys 1 (puree) solids. SLP will follow for PO toleration and ability to upgrade.   Swallow Evaluation  Recommendations       SLP Diet Recommendations: Dysphagia 1 (Puree) solids;Honey thick liquids   Liquid Administration via: Cup;Straw   Medication Administration: Crushed with puree   Supervision: Full supervision/cueing for compensatory strategies;Staff to assist with self feeding   Compensations: Slow rate;Small sips/bites   Postural Changes: Seated upright at 90 degrees   Oral Care Recommendations: Oral care BID;Staff/trained caregiver to provide oral care   Other Recommendations: Clarify dietary restrictions;Order thickener from pharmacy;Prohibited food (jello, ice cream, thin soups);Remove water pitcher;Have oral suction available    Sonia Baller, MA, CCC-SLP Speech Therapy

## 2022-10-28 NOTE — Progress Notes (Signed)
Hypoglycemic Event  CBG: 68   Treatment: D50 25 mL (12.5 gm)  Symptoms: None  Follow-up CBG: Time:1211  CBG Result:89  Possible Reasons for Event: Unknown  Comments/MD notified:Dewald    Truman Hayward, Anabel Bene

## 2022-10-28 NOTE — Progress Notes (Signed)
Hypoglycemic Event  CBG: 63  Treatment: D50 50 mL (25 gm)  Symptoms: None  Follow-up CBG: Time:1015 CBG Result:72  Possible Reasons for Event: Unknown  Comments/MD notified:Dewald    [name], Anabel Bene

## 2022-10-28 NOTE — Progress Notes (Signed)
Physical Therapy Treatment Patient Details Name: [name] MRN: [id number] DOB: 1966-11-23 Today's Date: 10/28/2022   History of Present Illness Pt is a 56 y.o. male who presented 10/21/22 with acute hypoxic respiratory failure in the setting of bilateral lower lobe pneumonia. ETT 10/25-10/28. Found to have large abscess on the R abdominal wall and a moderate to large right lumbar hernia that was not incarcerated, may plan for surgery 10/30. PMH: cardiac arrest with anoxic brain injury, Addison disease, afib, dementia, dysphagia, UTIs, HLD, hypothyroidism, seizure    PT Comments    Pt tolerates treatment well, standing multiple times during session with PT encouragement. Pt continues to require significant physical assistance to perform all functional mobility activities and is impulsive at times, laying sideways onto pillow on bed during session, nodding his head "yes" when asked if he was tired. Pt demonstrates poor balance and is at a high risk for falls due to weakness and instability. PT continues to recommend return to SNF when medically ready.  Recommendations for follow up therapy are one component of a multi-disciplinary discharge planning process, led by the attending physician.  Recommendations may be updated based on patient status, additional functional criteria and insurance authorization.  Follow Up Recommendations  Skilled nursing-short term rehab (<3 hours/day) Can patient physically be transported by private vehicle: No   Assistance Recommended at Discharge Frequent or constant Supervision/Assistance  Patient can return home with the following Two people to help with walking and/or transfers;A lot of help with bathing/dressing/bathroom;Assistance with feeding;Assistance with cooking/housework;Direct supervision/assist for medications management;Direct supervision/assist for financial management;Assist for transportation;Help with stairs or ramp for entrance   Equipment  Recommendations  None recommended by PT    Recommendations for Other Services       Precautions / Restrictions Precautions Precautions: Fall Precaution Comments: posey; bil mittens; watch BP and SpO2 Restrictions Weight Bearing Restrictions: No     Mobility  Bed Mobility Overal bed mobility: Needs Assistance Bed Mobility: Supine to Sit, Sit to Supine     Supine to sit: Max assist, HOB elevated Sit to supine: Mod assist, +2 for physical assistance, HOB elevated        Transfers Overall transfer level: Needs assistance Equipment used: 2 person hand held assist Transfers: Sit to/from Stand Sit to Stand: Mod assist, +2 physical assistance           General transfer comment: cues to extend hips and trunk    Ambulation/Gait Ambulation/Gait assistance: Total assist, +2 physical assistance Gait Distance (Feet): 2 Feet Assistive device: 2 person hand held assist Gait Pattern/deviations: Shuffle Gait velocity: reduced Gait velocity interpretation: <1.31 ft/sec, indicative of household ambulator   General Gait Details: PT/OT assisting pt in shuffling toward head of bed, pt unable to clear either foot   Stairs             Wheelchair Mobility    Modified Rankin (Stroke Patients Only)       Balance Overall balance assessment: Needs assistance Sitting-balance support: No upper extremity supported, Feet supported Sitting balance-Leahy Scale: Poor Sitting balance - Comments: minA-modA Postural control: Right lateral lean Standing balance support: Bilateral upper extremity supported Standing balance-Leahy Scale: Poor Standing balance comment: modA x2, posterior lean                            Cognition Arousal/Alertness: Awake/alert Behavior During Therapy: Flat affect Overall Cognitive Status: History of cognitive impairments - at baseline  General Comments: pt with history of anoxic brain injury,  demonstrates delayed responses, fairly consistent yes/nos. Follows one step commands with increased time        Exercises      General Comments General comments (skin integrity, edema, etc.): VSS on 4L [place]      Pertinent Vitals/Pain Pain Assessment Pain Assessment: Faces Faces Pain Scale: Hurts little more Pain Location: generalized Pain Descriptors / Indicators: Grimacing Pain Intervention(s): Monitored during session    Home Living                          Prior Function            PT Goals (current goals can now be found in the care plan section) Acute Rehab PT Goals Patient Stated Goal: does not report a goal Progress towards PT goals: Progressing toward goals (slowly)    Frequency    Min 2X/week      PT Plan Current plan remains appropriate    Co-evaluation PT/OT/SLP Co-Evaluation/Treatment: Yes Reason for Co-Treatment: Complexity of the patient's impairments (multi-system involvement);Necessary to address cognition/behavior during functional activity;For patient/therapist safety;To address functional/ADL transfers PT goals addressed during session: Mobility/safety with mobility;Balance;Strengthening/ROM        AM-PAC PT "6 Clicks" Mobility   Outcome Measure  Help needed turning from your back to your side while in a flat bed without using bedrails?: A Lot Help needed moving from lying on your back to sitting on the side of a flat bed without using bedrails?: A Lot Help needed moving to and from a bed to a chair (including a wheelchair)?: Total Help needed standing up from a chair using your arms (e.g., wheelchair or bedside chair)?: A Lot Help needed to walk in hospital room?: Total Help needed climbing 3-5 steps with a railing? : Total 6 Click Score: 9    End of Session Equipment Utilized During Treatment: Oxygen Activity Tolerance: Patient tolerated treatment well Patient left: in bed;with call bell/phone within reach;with bed alarm set  (mitts reapplied) Nurse Communication: Mobility status;Need for lift equipment PT Visit Diagnosis: Unsteadiness on feet (R26.81);Muscle weakness (generalized) (M62.81);Difficulty in walking, not elsewhere classified (R26.2);Other symptoms and signs involving the nervous system (R29.898)     Time: 9833-8250 PT Time Calculation (min) (ACUTE ONLY): 23 min  Charges:  $Therapeutic Activity: 8-22 mins                     Zenaida Niece, PT, DPT Acute Rehabilitation Office [place] [name] 10/28/2022, 1:20 PM

## 2022-10-28 NOTE — Progress Notes (Incomplete)
Nutrition Follow-up  DOCUMENTATION CODES:  Non-severe (moderate) malnutrition in context of social or environmental circumstances  INTERVENTION:  Continue current diet as recommended by SLP Magic cup TID with meals, each supplement provides 290 kcal and 9 grams of protein Tube feeding via cortrak: Osmolite 1.5 at 50 ml/h (1200 ml per day) Prosource TF20 60 ml 1x/d Provides 1880 kcal, 95 gm protein, 914 ml free water daily  NUTRITION DIAGNOSIS:  Moderate Malnutrition related to social / environmental circumstances (dysphagia, SNF resident, ABI) as evidenced by moderate fat depletion, moderate muscle depletion. - remains applicable  GOAL:  Patient will meet greater than or equal to 90% of their needs - progressing, TF in place, diet advanced  MONITOR:  Vent status, Weight trends, Labs, TF tolerance  REASON FOR ASSESSMENT:  Consult Enteral/tube feeding initiation and management  ASSESSMENT:  Pt with hx of ABI s/p cardiac arrest, HLD, GERD, cardiomyopathy, and possible quadriplegia presented to ED from SNF with worsening respiratory status. Found to be septic due to pneumonia. PEG at baseline and A&O x 2.   10/25 - Intubated 10/26 - TF started via OGT 10/28 - Extubated 10/30 - Cortrak placed, bedside I&D of abdominal abscess 10/31 - SLP advanced to DYS3 with honey liquids  ***  Average Meal Intake: 10/31: 50% average intake x 1 recorded meals   Intake/Output Summary (Last 24 hours) at 10/28/2022 1040 Last data filed at 10/28/2022 0900 Gross per 24 hour  Intake 3289.66 ml  Output 1270 ml  Net 2019.66 ml   Net IO Since Admission: 18,018.32 mL [10/28/22 1040]  Nutritionally Relevant Medications: Scheduled Meds:  feeding supplement (PROSource TF20)  60 mL Per Tube Daily   furosemide  20 mg Intravenous Once   guaiFENesin  5 mL Per Tube Q4H   polyethylene glycol  17 g Per Tube Daily   senna-docusate  1 tablet Per Tube BID   Continuous Infusions:  feeding supplement  (OSMOLITE 1.5 CAL) 50 mL/hr at 10/28/22 0900   Labs Reviewed  NUTRITION - FOCUSED PHYSICAL EXAM: Flowsheet Row Most Recent Value  Orbital Region Moderate depletion  Upper Arm Region Mild depletion  Thoracic and Lumbar Region Mild depletion  Buccal Region Moderate depletion  Temple Region Moderate depletion  Clavicle Bone Region Moderate depletion  Clavicle and Acromion Bone Region Moderate depletion  Scapular Bone Region Unable to assess  Dorsal Hand Mild depletion  Patellar Region Severe depletion  Anterior Thigh Region Severe depletion  Posterior Calf Region Severe depletion  Edema (RD Assessment) None  Hair Reviewed  Eyes Unable to assess  Mouth Reviewed  Skin Reviewed  Nails Reviewed    Diet Order:   Diet Order             DIET DYS 3 Room service appropriate? Yes; Fluid consistency: Honey Thick  Diet effective now                   EDUCATION NEEDS:  Not appropriate for education at this time  Skin:  Skin Assessment: Reviewed RN Assessment (Stage 1 buttocks)  Last BM:  10/29 - type 6  Height:  Ht Readings from Last 1 Encounters:  10/22/22 5\' 8"  (1.727 m)   Weight:  Wt Readings from Last 1 Encounters:  10/27/22 61.7 kg   Ideal Body Weight:  70 kg  BMI:  Body mass index is 20.68 kg/m.  Estimated Nutritional Needs:  Kcal:  1800-2000 kcal/d Protein:  85-100g/d Fluid:  1.8-2L/d    Ranell Patrick, RD, LDN Clinical Dietitian RD  pager # available in [place]  After hours/weekend pager # available in [facility]

## 2022-10-28 NOTE — Progress Notes (Signed)
Patient with increasingly wet cough on 2L sats in the 80's. RT placed patient on HFNC 10L and sats improved to low 90's. Chest PT started on patient but patient unable to cough anything out. Multiple low blood glucose readings over the night and morning.  CCM made aware, MD at bedside.

## 2022-10-29 DIAGNOSIS — E440 Moderate protein-calorie malnutrition: Secondary | ICD-10-CM | POA: Diagnosis not present

## 2022-10-29 DIAGNOSIS — J9601 Acute respiratory failure with hypoxia: Secondary | ICD-10-CM | POA: Diagnosis not present

## 2022-10-29 LAB — AEROBIC CULTURE W GRAM STAIN (SUPERFICIAL SPECIMEN)

## 2022-10-29 LAB — BASIC METABOLIC PANEL
Anion gap: 7 (ref 5–15)
BUN: 12 mg/dL (ref 6–20)
CO2: 27 mmol/L (ref 22–32)
Calcium: 8 mg/dL — ABNORMAL LOW (ref 8.9–10.3)
Chloride: 102 mmol/L (ref 98–111)
Creatinine, Ser: 0.7 mg/dL (ref 0.61–1.24)
GFR, Estimated: >60 mL/min (ref >60)
Glucose, Bld: 87 mg/dL (ref 70–99)
Potassium: 4.7 mmol/L (ref 3.5–5.1)
Sodium: 136 mmol/L (ref 135–145)

## 2022-10-29 LAB — CBC
HCT: 33 % — ABNORMAL LOW (ref 39.0–52.0)
Hemoglobin: 10.7 g/dL — ABNORMAL LOW (ref 13.0–17.0)
MCH: 28.8 pg (ref 26.0–34.0)
MCHC: 32.4 g/dL (ref 30.0–36.0)
MCV: 88.7 fL (ref 80.0–100.0)
Platelets: 226 10*3/uL (ref 150–400)
RBC: 3.72 MIL/uL — ABNORMAL LOW (ref 4.22–5.81)
RDW: 13.9 % (ref 11.5–15.5)
WBC: 7.1 10*3/uL (ref 4.0–10.5)
nRBC: 0 % (ref 0.0–0.2)

## 2022-10-29 LAB — GLUCOSE, CAPILLARY
Glucose-Capillary: 85 mg/dL (ref 70–99)
Glucose-Capillary: 71 mg/dL (ref 70–99)
Glucose-Capillary: 91 mg/dL (ref 70–99)
Glucose-Capillary: 79 mg/dL (ref 70–99)
Glucose-Capillary: 86 mg/dL (ref 70–99)
Glucose-Capillary: 72 mg/dL (ref 70–99)

## 2022-10-29 NOTE — TOC Initial Note (Signed)
Transition of Care [facility]) - Initial/Assessment Note    Patient Details  Name: [name] MRN: [id number] Date of Birth: [birth year]  Transition of Care [facility]) CM/SW Contact:    Amador Cunas, [place] Phone Number: 10/29/2022, 10:19 AM  Clinical Narrative: SW spoke with pt's legal guardian/sister [name] [phone] re dc plan. Pt's guardian reports pt has been a LTC resident at [facility] "for years" and she has been recently attempting new placement closer to her home in [place], [place] which has been unsuccessful. Guardian requesting SW attempt new SNF search in [place] and surrounding areas but is agreeable to pt returning to [facility] if SW unable to secure new bed offer. Guardian aware of barriers to locating SNF placement.   Referrals made to:    -[facility] [place], [place] [address] [phone]  -[facility] [address] [place], [place] [address] [phone]  -[place] [address] [place], [place] [address] [phone]  [facility] [address] [place], [place] [address] [phone]  -[facility] [address] [place], [place] [address] [phone]  -[place], [place] [address] [phone]  Wandra Feinstein, MSW, [place] (coverage)                     Expected Discharge Plan: [place] Barriers to Discharge: Continued Medical Work up   Patient Goals and CMS Choice     Choice offered to / list presented to : Sibling (sister is legal guardian)  Expected Discharge Plan and Services Expected Discharge Plan: [place] arrangements for the past 2 months: [place]                                      Prior Living  Arrangements/Services Living arrangements for the past 2 months: [place] Lives with:: Facility Resident Patient language and need for interpreter reviewed:: No        Need for Family Participation in Patient Care: Yes (Comment) Care giver support system in place?: Yes (comment)   Criminal Activity/Legal Involvement Pertinent to Current Situation/Hospitalization: No - Comment as needed  Activities of Daily Living      Permission Sought/Granted Permission sought to share information with : Facility Art therapist granted to share information with : Yes, Verbal Permission Granted              Emotional Assessment       Orientation: : Oriented to Self, Fluctuating Orientation (Suspected and/or reported Sundowners) Alcohol / Substance Use: Not Applicable Psych Involvement: No (comment)  Admission diagnosis:  Respiratory failure ([place]) [J96.90] Acute respiratory failure with hypoxia ([place]) [J96.01] Pneumonia of right lower lobe due to infectious organism [J18.9] Patient Active Problem List   Diagnosis Date Noted   Malnutrition of moderate degree 10/22/2022   Pressure injury of skin 10/22/2022   Acute hypoxemic respiratory failure ([place]) 10/21/2022   Pneumonia of right lower lobe due to infectious organism 10/21/2022   Respiratory failure ([place]) 10/21/2022   PEG Gastrostomy tube in place ([place]) 01/26/2018   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 01/26/2018 01/26/2018   Bedridden 01/26/2018   Quadriplegia ([place])    Abdominal wall  abscess 01/25/2018   Fracture of humerus anatomical neck, right, closed, initial encounter 10/03/2017   AKI (acute kidney injury) (HCC) 10/03/2017   UTI (urinary tract infection) 10/03/2017   Agitation 07/03/2014   Cardiomyopathy, ischemic 06/18/2014   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   Acute psychosis (HCC) 04/19/2014   Unspecified hypothyroidism 05/16/2013   Constipation 05/16/2013   Anemia of other  chronic disease 05/16/2013   Mental disorder 01/03/2013   Addisons disease (HCC) 01/03/2013   PEG (percutaneous endoscopic gastrostomy) adjustment/replacement/removal (HCC) 12/21/2011   Hypokalemia 11/25/2011   Thrombocytopenia (HCC) 11/25/2011   FTT (failure to thrive) in adult 11/25/2011   Grand mal seizure (HCC) 11/23/2011   Depression (emotion) 11/23/2011   History of atrial fibrillation 11/23/2011   Other specified hemorrhagic conditions (HCC) 11/23/2011   Glaucoma 11/23/2011   Hypoglycemia 11/23/2011   Chronic constipation 11/23/2011   Anoxic brain injury (HCC) 11/12/2011   Protein calorie malnutrition (HCC) 11/12/2011   Hyperchloremia 11/12/2011   Hyperthyroidism 11/12/2011   Acute lower UTI 11/12/2011   Dysphagia    PCP:  No primary care provider on file. Pharmacy:   [facility] - [place], [place] - [address] [place] [address] Phone: [phone] Fax: [phone]     Social Determinants of Health (SDOH) Interventions    Readmission Risk Interventions     No data to display

## 2022-10-29 NOTE — Progress Notes (Signed)
PROGRESS NOTE    [name]  YYT:[id number] DOB: 11-03-1966 DOA: 10/21/2022 PCP: No primary care provider on file.    Chief Complaint  Patient presents with   Respiratory Arrest    Brief Narrative:   56 year old man admitted from [facility] with significant for Anoxic Brain injury secondary to remote cardiac arrest, PEG dependent(removed), CAD, hypothyroidism, seizure disorder, paroxysmal A. fib not on systemic anticoagulation and adrenal insufficiency  -Patient presents from SNF hypoxic in the 70s, agitated, likely with aspiration event, required intubation and admission to ICU. ETT 10/25-10/28. Found to have large abscess on the R abdominal wall s/p bedside I&D by surgery ,  Patient improved, successfully extubated and transferred to [facility] 10/29/2022.    Assessment & Plan:   Principal Problem:   Respiratory failure (HCC) Active Problems:   Anoxic brain injury (HCC)   Protein calorie malnutrition (HCC)   Cardiomyopathy, ischemic   Quadriplegia (HCC)   Acute hypoxemic respiratory failure (HCC)   Pneumonia of right lower lobe due to infectious organism   Malnutrition of moderate degree   Pressure injury of skin   Septic shock 2/2 pneumonia HCAP Intra-abdominal abscess -Septic shock present on admission, requiring pressor support -Pathophysiology of sepsis has resolved, off pressor for 24 hours. -Continue vancomycin -Continue midodrine 10 mg q8h -Transfer to progressive bed   Acute respiratory failure with hypoxia (HCC) Respiratory acidosis, resolved -Currently extubated, requiring nasal cannula. - Was encouraged to use incentive spirometry and flutter valve. -Start hypertonic 3% nebulized solution -Start guaifenesin per tube to help loosen secretions -Continue O2 supplementation, wean for O2 sat >92% -Discontinue scopolamine patch   Hypoglycemia -Continue with D10 LR at 20 cc/h, he remains on tube feed, as well as started to tolerate some oral  intake.   R abdominal wall abscess Hx of abdominal hernia CT A/P showed enlarging 10.7 x 8.6 x 7.5 cm abnormal fluid collection concerning for possible abscess.  Successful bedside I&D by CCS on 10/30.  Mild tenderness around the incision site but no complications -CCS following, appreciate recs -Continue vancomycin, de-escalate based on wound culture and sensitivities -Dressing changes per CCS   Parox A-fib, resolved Sinus brady Remains in NSR but has had intermittent bradycardia for the last 48 hr. CHADVAs score of 2. No electrolyte abnormalities. TSH within normal limits. Patient is at a significant risk of falls at baseline.  Patient's risk of bleeding outweighs his risk of stroke so we will hold off on long-term anticoagulation.  HR has been stable in the 60s. -Telemetry -Lovenox for DVT prophylaxis -Trend and replete electrolytes   Cardiomyopathy, ischemic Last TTE 09/2017 showed EF 60 to 65% and akinesis of the basal inferior myocardium. Repeat TTE showed EF 50 to 55%, with global hypokinesis but no valvular abnormalities. -Monitor and replete electrolytes -Strict I&O's   Hypothyroidism Repeat stable at TSH 5.5. -Continue Synthroid to 225 mcg daily   Protein calorie malnutrition (HCC) -Cortrak placed on 10/30.  Evaluated by SLP this morning who recommended dysphagia 3 diet with plan for pulm swallow.  -Follow-up MBS -Dysphagia 3 diet -Continue TF for now   Hx of anoxic brain injury, TBI Seizure disorder -Continue home Haldol per tube -Continue valproic acid per tube -Continue BID Klonopin and PRN ativan for agitation  Body mass index is 21.05 kg/m.  DVT prophylaxis: Lovenox Code Status: (Partial) Family Communication: None at bedside  Status is: Inpatient    Consultants:  PCCM General surgery   Subjective:  No significant events overnight as discussed  with staff, was able to tolerate oral intake with full assist.  Objective: Vitals:   10/29/22 0800  10/29/22 0830 10/29/22 0900 10/29/22 0930  BP: (!) 89/59 (!) 85/60 (!) 84/52 (!) 83/57  Pulse: 62 65 69 69  Resp: 19 (!) 24 (!) 22 (!) 23  Temp:      TempSrc:      SpO2: 93% 93% (!) 89% 92%  Weight:      Height:        Intake/Output Summary (Last 24 hours) at 10/29/2022 1122 Last data filed at 10/29/2022 0700 Gross per 24 hour  Intake 2234.95 ml  Output 3585 ml  Net -1350.05 ml   Filed Weights   10/26/22 0500 10/27/22 0500 10/29/22 0500  Weight: 68.4 kg 61.7 kg 62.8 kg    Examination:  Awake, chronically ill, cachectic, laying in bed in no apparent distress Symmetrical Chest wall movement, air entry at the bases, no increased work of breathing RRR,No Gallops,Rubs or new Murmurs, No Parasternal Heave +ve B.Sounds, Abd Soft, abdomen covered with dressing no Cyanosis, Clubbing or edema, No new Rash or bruise      Data Reviewed: I have personally reviewed following labs and imaging studies  CBC: Recent Labs  Lab 10/23/22 0431 10/24/22 0600 10/25/22 0702 10/25/22 2241 10/26/22 0628 10/28/22 0223 10/29/22 0259  WBC 11.2* 11.7* 11.2*  --  10.5 6.4 7.1  NEUTROABS 8.3*  --   --   --   --   --   --   HGB 9.8* 10.7* 10.1* 8.8* 11.6* 10.3* 10.7*  HCT 30.4* 33.1* 31.4* 26.0* 35.6* 30.9* 33.0*  MCV 90.5 88.7 88.2  --  86.4 87.5 88.7  PLT 186 232 223  --  231 198 443    Basic Metabolic Panel: Recent Labs  Lab 10/22/22 1614 10/22/22 2216 10/22/22 2216 10/23/22 0431 10/24/22 0600 10/25/22 2241 10/26/22 0628 10/27/22 1037 10/28/22 0223 10/29/22 0259  NA  --  142   < > 139  --  131* 136 138 137 136  K  --  4.6   < > 3.8  --  4.0 4.2 4.1 4.2 4.7  CL  --  105   < > 108  --   --  103 105 104 102  CO2  --  23   < > 24  --   --  22 24 25 27   GLUCOSE  --  107*   < > 113*  --   --  90 98 72 87  BUN  --  22*   < > 18  --   --  10 12 12 12   CREATININE  --  0.88   < > 0.76  --   --  0.68 0.78 0.70 0.70  CALCIUM  --  8.6*   < > 8.2*  --   --  8.1* 7.8* 7.8* 8.0*  MG 1.6* 1.6*   --  1.7 2.2  --   --   --   --   --   PHOS 3.8  --   --  3.1  --   --   --   --   --   --    < > = values in this interval not displayed.    GFR: Estimated Creatinine Clearance: 92.7 mL/min (by C-G formula based on SCr of 0.7 mg/dL).  Liver Function Tests: Recent Labs  Lab 10/23/22 0431 10/28/22 0223  AST 11* 42*  ALT 6 16  ALKPHOS 40 50  BILITOT  0.3 0.5  PROT 5.3* 5.0*  ALBUMIN 1.6* 1.6*    CBG: Recent Labs  Lab 10/28/22 1722 10/28/22 1924 10/28/22 2315 10/29/22 0319 10/29/22 0743  GLUCAP 89 108* 91 85 71     Recent Results (from the past 240 hour(s))  Blood Culture (routine x 2)     Status: None   Collection Time: 10/21/22  6:33 PM   Specimen: BLOOD RIGHT HAND  Result Value Ref Range Status   Specimen Description BLOOD RIGHT HAND  Final   Special Requests   Final    BOTTLES DRAWN AEROBIC AND ANAEROBIC Blood Culture adequate volume   Culture   Final    NO GROWTH 5 DAYS Performed at [facility], [address], [place], [place] [address]    Report Status 10/26/2022 FINAL  Final  Blood Culture (routine x 2)     Status: None   Collection Time: 10/21/22  7:04 PM   Specimen: BLOOD RIGHT FOREARM  Result Value Ref Range Status   Specimen Description BLOOD RIGHT FOREARM  Final   Special Requests   Final    BOTTLES DRAWN AEROBIC AND ANAEROBIC Blood Culture adequate volume   Culture   Final    NO GROWTH 5 DAYS Performed at [facility], [place] [address], [place], [place] [address]    Report Status 10/26/2022 FINAL  Final  Resp Panel by RT-PCR (Flu A&B, Covid) Anterior Nasal Swab     Status: None   Collection Time: 10/21/22  7:07 PM   Specimen: Anterior Nasal Swab  Result Value Ref Range Status   SARS Coronavirus 2 by RT PCR NEGATIVE NEGATIVE Final    Comment: (NOTE) SARS-CoV-2 target nucleic acids are NOT DETECTED.  The SARS-CoV-2 RNA is generally detectable in upper respiratory specimens during the acute phase of infection. The  lowest concentration of SARS-CoV-2 viral copies this assay can detect is 138 copies/mL. A negative result does not preclude SARS-Cov-2 infection and should not be used as the sole basis for treatment or other patient management decisions. A negative result may occur with  improper specimen collection/handling, submission of specimen other than nasopharyngeal swab, presence of viral mutation(s) within the areas targeted by this assay, and inadequate number of viral copies(<138 copies/mL). A negative result must be combined with clinical observations, patient history, and epidemiological information. The expected result is Negative.  Fact Sheet for Patients:  EntrepreneurPulse.com.au  Fact Sheet for Healthcare Providers:  IncredibleEmployment.be  This test is no t yet approved or cleared by the [place] FDA and  has been authorized for detection and/or diagnosis of SARS-CoV-2 by FDA under an Emergency Use Authorization (EUA). This EUA will remain  in effect (meaning this test can be used) for the duration of the COVID-19 declaration under Section 564(b)(1) of the Act, 21 U.S.C.section 360bbb-3(b)(1), unless the authorization is terminated  or revoked sooner.       Influenza A by PCR NEGATIVE NEGATIVE Final   Influenza B by PCR NEGATIVE NEGATIVE Final    Comment: (NOTE) The Xpert Xpress SARS-CoV-2/FLU/RSV plus assay is intended as an aid in the diagnosis of influenza from Nasopharyngeal swab specimens and should not be used as a sole basis for treatment. Nasal washings and aspirates are unacceptable for Xpert Xpress SARS-CoV-2/FLU/RSV testing.  Fact Sheet for Patients: EntrepreneurPulse.com.au  Fact Sheet for Healthcare Providers: IncredibleEmployment.be  This test is not yet approved or cleared by the [place] FDA and has been authorized for detection and/or diagnosis of SARS-CoV-2 by  FDA under  an Emergency Use Authorization (EUA). This EUA will remain in effect (meaning this test can be used) for the duration of the COVID-19 declaration under Section 564(b)(1) of the Act, 21 U.S.C. section 360bbb-3(b)(1), unless the authorization is terminated or revoked.  Performed at [facility], [place] [address], [place], [place] [address]   Urine Culture     Status: None   Collection Time: 10/22/22 12:39 AM   Specimen: In/Out Cath Urine  Result Value Ref Range Status   Specimen Description IN/OUT CATH URINE  Final   Special Requests NONE  Final   Culture   Final    NO GROWTH Performed at [facility], [place] [address], [place], [place] [address]    Report Status 10/23/2022 FINAL  Final  MRSA Next Gen by PCR, Nasal     Status: None   Collection Time: 10/22/22 12:45 AM   Specimen: Nasal Mucosa; Nasal Swab  Result Value Ref Range Status   MRSA by PCR Next Gen NOT DETECTED NOT DETECTED Final    Comment: (NOTE) The GeneXpert MRSA Assay (FDA approved for NASAL specimens only), is one component of a comprehensive MRSA colonization surveillance program. It is not intended to diagnose MRSA infection nor to guide or monitor treatment for MRSA infections. Test performance is not FDA approved in patients less than 75 years old. Performed at [facility], [place] [address] [place], [place] [address]   Culture, Respiratory w Gram Stain     Status: None   Collection Time: 10/22/22 12:52 AM   Specimen: Tracheal Aspirate; Respiratory  Result Value Ref Range Status   Specimen Description TRACHEAL ASPIRATE  Final   Special Requests NONE  Final   Gram Stain   Final    FEW WBC PRESENT,BOTH PMN AND MONONUCLEAR ABUNDANT GRAM POSITIVE RODS RARE YEAST WITH PSEUDOHYPHAE    Culture   Final    MODERATE Normal respiratory flora-no Staph aureus or Pseudomonas seen Performed at [facility], [place] [address], [place], [place] [address]    Report Status 10/24/2022 FINAL  Final   Aerobic Culture w Gram Stain (superficial specimen)     Status: None   Collection Time: 10/24/22  6:00 AM   Specimen: Wound  Result Value Ref Range Status   Specimen Description WOUND  Final   Special Requests ABDOMEN  Final   Gram Stain   Final    FEW WBC PRESENT,BOTH PMN AND MONONUCLEAR RARE GRAM POSITIVE COCCI IN SINGLES Performed at [facility], [place] [address] [place], [place] [address]    Culture FEW METHICILLIN RESISTANT STAPHYLOCOCCUS AUREUS  Final   Report Status 10/26/2022 FINAL  Final   Organism ID, Bacteria METHICILLIN RESISTANT STAPHYLOCOCCUS AUREUS  Final      Susceptibility   Methicillin resistant staphylococcus aureus - MIC*    CIPROFLOXACIN >=8 RESISTANT Resistant     ERYTHROMYCIN >=8 RESISTANT Resistant     GENTAMICIN <=0.5 SENSITIVE Sensitive     OXACILLIN >=4 RESISTANT Resistant     TETRACYCLINE <=1 SENSITIVE Sensitive     VANCOMYCIN 1 SENSITIVE Sensitive     TRIMETH/SULFA <=10 SENSITIVE Sensitive     CLINDAMYCIN >=8 RESISTANT Resistant     RIFAMPIN <=0.5 SENSITIVE Sensitive     Inducible Clindamycin NEGATIVE Sensitive     * FEW METHICILLIN RESISTANT STAPHYLOCOCCUS AUREUS  Aerobic Culture w Gram Stain (superficial specimen)     Status: None (Preliminary result)   Collection Time: 10/26/22  1:54 PM  Specimen: Wound  Result Value Ref Range Status   Specimen Description WOUND ABDOMEN  Final   Special Requests NONE  Final   Gram Stain   Final    RARE WBC PRESENT,BOTH PMN AND MONONUCLEAR RARE GRAM POSITIVE COCCI IN CLUSTERS    Culture   Final    CULTURE REINCUBATED FOR BETTER GROWTH Performed at [facility], [place] [address], [place], [place] [address]    Report Status PENDING  Incomplete         Radiology Studies: DG Swallowing Func-Speech Pathology  Result Date: 10/28/2022 Table formatting from the original result was not included. Objective Swallowing Evaluation: Type of Study: MBS-Modified Barium Swallow Study  Patient Details Name:  Chanon Croes MRN: [id number] Date of Birth: 1966/03/18 Today's Date: 10/28/2022 Time: SLP Start Time (ACUTE ONLY): 38 -SLP Stop Time (ACUTE ONLY): [id number] SLP Time Calculation (min) (ACUTE ONLY): 20 min Past Medical History: Past Medical History: Diagnosis Date  Addison disease ([place])   Anoxic brain injury ([place])   TBI  Anxiety   Aspiration pneumonia ([place])   Atrial fibrillation ([place])   Chronic constipation 11/23/2011  Dementia ([place])   Dysphagia   Encephalopathy   Frontal lobe syndrome   Gait abnormality   freq falls  Glaucoma   Glucocorticoid deficiency ([place])   History of heart attack   History of recurrent UTIs   Hyperlipemia   Hypothyroidism   Mental disorder   Myocardial infarction ([place])   Pneumoperitoneum of unknown etiology 12/21/2011  Quadriplegia ([place])   Reflux   Seizure disorder (HCC)   Weakness of both legs  Past Surgical History: Past Surgical History: Procedure Laterality Date  DEBRIDEMENT OF ABDOMINAL WALL ABSCESS  01/26/2018  HERNIA MESH REMOVAL  01/26/2018  Infected mesh - removed w I&D abd wall abscess  IR GENERIC HISTORICAL  10/20/2016  IR REPLC GASTRO/COLONIC TUBE PERCUT W/FLUORO 10/20/2016 Arne Cleveland, MD [facility]-INTERV RAD  IR REPLACE G-TUBE SIMPLE WO FLUORO  01/28/2018  IR [place] GASTRO/COLONIC TUBE PERCUT W/FLUORO  04/21/2017  IR [place] GASTRO/COLONIC TUBE PERCUT W/FLUORO  07/22/2017  IR [place] GASTRO/COLONIC TUBE PERCUT W/FLUORO  11/24/2017  IR REPLC GASTRO/COLONIC TUBE PERCUT W/FLUORO  08/12/2020  IRRIGATION AND DEBRIDEMENT ABSCESS N/A 01/26/2018  Procedure: IRRIGATION AND DEBRIDEMENT ABDOMINAL WALL ABSCESS WITH REMOVAL OF INFECTED MESH;  Surgeon: Michael Boston, MD;  Location: [facility];  Service: General;  Laterality: N/A;  NEPHRECTOMY  unknown  PEG TUBE PLACEMENT   HPI: 56 year old male with prior history of cardiac arrest with anoxic brain injury who presented with acute hypoxic respiratory failure in the setting of bilateral lower lobe pneumonia requiring endotracheal intubation.  Pt was intubated from  10/25-10/28.  MBS in 2018 recommending Dysphagia 3 solids and honey-thick liquids.  Subjective: awake, alert, cooperative  Recommendations for follow up therapy are one component of a multi-disciplinary discharge planning process, led by the attending physician.  Recommendations may be updated based on patient status, additional functional criteria and insurance authorization. Assessment / Plan / Recommendation   10/28/2022   2:15 PM Clinical Impressions Clinical Impression Patient presenting with a mild oral dysphagia and a mild-moderate pharyngeal phase dysphagia with impact from cognition leading to him being very impulsive with PO intake. Patient tested with the following barium consistencies, nectar thick, honey thick, puree and soft solid. He exhibited mildly delayed anterior to posterior transit of liquids and solids as well as decreased mastication with soft solids. During pharyngeal phase, patient exhibited swallow initation delay to level of vallecular sinus with all tested bolus consistencies.  Only trace amount of vallecular residuals observed s/p initial swallow with liquids and solids. With nectar thick liquids, patient had two instances of silent aspiration which occured during the swallow, one time being trace amount and the next time a moderate amount. He did not exhibit a cough response until 1-2 minutes after the aspiration event.  These aspiration events occured when patient drinking nectar thick liquid through straw and when tilting his head back taking large cup sip. With small, controlled cup sips, he exhibited only trace penetration. With honey thick liquids, patient did not exhibit any instances of penetration or aspiration and he was able to draw honey thick liquid through straw. SLP tested him on small and large sips and no penetration or aspiration observed. Patient exhibited mastication delays with soft solids and as he is very impulsive with PO intake, SLP recommending to continue with  honey thick liquids but to downgrade to Dys 1 (puree) solids. SLP will follow for PO toleration and ability to upgrade. SLP Visit Diagnosis Dysphagia, oropharyngeal phase (R13.12) Impact on safety and function Moderate aspiration risk;Risk for inadequate nutrition/hydration     10/28/2022   2:15 PM Treatment Recommendations Treatment Recommendations Therapy as outlined in treatment plan below     10/28/2022   2:23 PM Prognosis Prognosis for Safe Diet Advancement Fair Barriers to Reach Goals Cognitive deficits;Behavior   10/28/2022   2:15 PM Diet Recommendations SLP Diet Recommendations Dysphagia 1 (Puree) solids;Honey thick liquids Liquid Administration via Cup;Straw Medication Administration Crushed with puree Compensations Slow rate;Small sips/bites Postural Changes Seated upright at 90 degrees     10/28/2022   2:15 PM Other Recommendations Oral Care Recommendations Oral care BID;Staff/trained caregiver to provide oral care Other Recommendations Clarify dietary restrictions;Order thickener from pharmacy;Prohibited food (jello, ice cream, thin soups);Remove water pitcher;Have oral suction available Follow Up Recommendations Skilled nursing-short term rehab (<3 hours/day) Assistance recommended at discharge Frequent or constant Supervision/Assistance Functional Status Assessment Patient has had a recent decline in their functional status and demonstrates the ability to make significant improvements in function in a reasonable and predictable amount of time.   10/28/2022   2:15 PM Frequency and Duration  Speech Therapy Frequency (ACUTE ONLY) min 2x/week Treatment Duration 2 weeks     10/28/2022   2:14 PM Oral Phase Oral Phase Impaired Oral - Honey Cup Reduced posterior propulsion Oral - Nectar Cup Reduced posterior propulsion Oral - Nectar Straw Reduced posterior propulsion Oral - Puree Reduced posterior propulsion Oral - Mech Soft Reduced posterior propulsion;Impaired mastication    10/28/2022   2:14 PM Pharyngeal Phase  Pharyngeal Phase Impaired Pharyngeal- Honey Cup Delayed swallow initiation-vallecula;Pharyngeal residue - valleculae Pharyngeal- Nectar Cup Delayed swallow initiation-vallecula;Reduced airway/laryngeal closure;Penetration/Aspiration during swallow;Pharyngeal residue - valleculae Pharyngeal Material enters airway, passes BELOW cords without attempt by patient to eject out (silent aspiration) Pharyngeal- Nectar Straw Penetration/Aspiration during swallow;Reduced airway/laryngeal closure;Delayed swallow initiation-vallecula;Pharyngeal residue - valleculae Pharyngeal Material enters airway, passes BELOW cords without attempt by patient to eject out (silent aspiration) Pharyngeal- Puree Delayed swallow initiation-vallecula Pharyngeal- Mechanical Soft Delayed swallow initiation-vallecula    10/28/2022   2:15 PM Cervical Esophageal Phase  Cervical Esophageal Phase [facility] Sonia Baller, MA, CCC-SLP Speech Therapy                     DG CHEST PORT 1 VIEW  Result Date: 10/28/2022 CLINICAL DATA:  Dyspnea, respiratory failure. EXAM: PORTABLE CHEST 1 VIEW COMPARISON:  Chest radiograph 10/22/2022 FINDINGS: The enteric catheter courses below the diaphragm and off the  field of view. The cardiomediastinal silhouette is stable There are new/increased bilateral pleural effusions with left worse than right bibasilar airspace opacities. There is no definite overt pulmonary edema. There is no pneumothorax There is no acute osseous abnormality. IMPRESSION: Overall worsened aeration since 10/22/2022 with new bilateral pleural effusions and left worse than right basilar airspace opacities may reflect developing infection and/or atelectasis. Electronically Signed   By: Valetta Mole M.D.   On: 10/28/2022 08:42        Scheduled Meds:  Chlorhexidine Gluconate Cloth  6 each Topical Daily   clonazePAM  0.5 mg Per Tube BID   enoxaparin (LOVENOX) injection  40 mg Subcutaneous Q24H   feeding supplement (PROSource TF20)  60 mL Per Tube  Daily   guaiFENesin  5 mL Per Tube Q4H   haloperidol  5 mg Per Tube QPM   levothyroxine  225 mcg Per Tube Q0600   midodrine  10 mg Per Tube Q8H   sertraline  75 mg Per Tube Daily   sodium chloride HYPERTONIC  4 mL Nebulization BID   valproic acid  500 mg Per Tube TID   Continuous Infusions:  dextrose 20 mL/hr at 10/29/22 0700   feeding supplement (OSMOLITE 1.5 CAL) 50 mL/hr at 10/29/22 0700   vancomycin Stopped (10/29/22 0046)     LOS: 8 days        Phillips Climes, MD [facility]   To contact the attending provider between 7A-7P or the covering provider during after hours 7P-7A, please log into the web site [email or web address] and access using universal [place] password for that web site. If you do not have the password, please call the hospital operator.  10/29/2022, 11:22 AM

## 2022-10-29 NOTE — Progress Notes (Signed)
Speech Language Pathology Treatment: Dysphagia  Patient Details Name: [name] MRN: [id number] DOB: March 30, 1966 Today's Date: 10/29/2022 Time: 0086-7619 SLP Time Calculation (min) (ACUTE ONLY): 18 min  Assessment / Plan / Recommendation Clinical Impression  Pt seen for ongoing dysphagia management.  Pt tolerated all consistencies trialed today (HTL by straw, puree, soft solid, regular solid) without any clinical s/s of aspiration. Pt endorses regular diet at baseline.  Pt with no penetration or aspiration of solid consistencies on yesterday's MBSS. Pt requires 1:1 assistance with feeding.  Discussed pacing with meals as pt exhibits impulsivity with PO intake.  SLP removed straw from mouth to limit bolus flow.  Check for full oral clearance prior to offering additional bites of food.  As pt will be fed by trained caregiver reducing risk from impulsivity, will advance diet to mechanical soft solids.  Recommend mechanical soft solid with honey thick liquids.     HPI HPI: 56 year old male with prior history of cardiac arrest with anoxic brain injury who presented with acute hypoxic respiratory failure in the setting of bilateral lower lobe pneumonia requiring endotracheal intubation.  Pt was intubated from 10/25-10/28.  MBS in 2018 recommending Dysphagia 3 solids and honey-thick liquids.      SLP Plan  Continue with current plan of care      Recommendations for follow up therapy are one component of a multi-disciplinary discharge planning process, led by the attending physician.  Recommendations may be updated based on patient status, additional functional criteria and insurance authorization.    Recommendations  Diet recommendations: Dysphagia 3 (mechanical soft);Honey-thick liquid  Liquids provided via: Straw;Cup  Medication Administration: Crushed with puree  Supervision: Trained caregiver to feed patient  Compensations:  Slow rate; Small sips/bites; Check for oral  clearance; Pinch and remove straw to limit bolus flow as needed  Postural Changes and/or Swallow Maneuvers: Seated upright 90 degrees                Oral Care Recommendations: Oral care BID;Staff/trained caregiver to provide oral care Follow Up Recommendations: Skilled nursing-short term rehab (<3 hours/day) Assistance recommended at discharge: Frequent or constant Supervision/Assistance SLP Visit Diagnosis: Dysphagia, oropharyngeal phase (R13.12) Plan: Continue with current plan of care           Celedonio Savage, [place], [place] Office: [phone] 10/29/2022, 10:46 AM

## 2022-10-29 NOTE — Progress Notes (Addendum)
SNF bed offer received from [facility] with [facility] [phone]. Updated pt's legal guardian/sister Inez Catalina [phone] and she has accepted offer. Jarrett Soho to attempt auth given PT is working with pt this admission but will accept pt under Medicaid if [facility] denies.   Per MD, EDD is Monday 11/6. Ambulance transport arranged for Monday at 12 noon with Ocie Cornfield [phone], spoke to [facility] who confirmed they do not need upfront payment from family. Will need to cancel with [facility] if pt not medically stable for dc Monday.    MD updated. SW will follow.   Wandra Feinstein, MSW, LCSW [phone] (coverage)

## 2022-10-29 NOTE — NC FL2 (Signed)
[place] LEVEL OF CARE SCREENING TOOL     IDENTIFICATION  Patient Name: [name] Birthdate: 1966/02/23 Sex: male Admission Date (Current Location): 10/21/2022  [facility] and [place] Number:  Herbalist and Address:  [facility] [place], [place] [address] [place], [place] [address]      Provider Number: [id number]  Attending Physician Name and Address:  Elgergawy, Silver Huguenin, MD  Relative Name and Phone Number:  Roxanne Mins Guradian/sister [phone]    Current Level of Care: Hospital Recommended Level of Care: [place] Prior Approval Number:    Date Approved/Denied:   PASRR Number: [id number]  Discharge Plan: SNF    Current Diagnoses: Patient Active Problem List   Diagnosis Date Noted   Malnutrition of moderate degree 10/22/2022   Pressure injury of skin 10/22/2022   Acute hypoxemic respiratory failure ([place]) 10/21/2022   Pneumonia of right lower lobe due to infectious organism 10/21/2022   Respiratory failure ([place]) 10/21/2022   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 01/26/2018 01/26/2018   Bedridden 01/26/2018   Quadriplegia ([place])    Abdominal wall abscess 01/25/2018   Fracture of humerus anatomical neck, right, closed, initial encounter 10/03/2017   AKI (acute kidney injury) ([place]) 10/03/2017   UTI (urinary tract infection) 10/03/2017   Agitation 07/03/2014   Cardiomyopathy, ischemic 06/18/2014   Acute psychosis ([place]) 04/19/2014   Unspecified hypothyroidism 05/16/2013   Constipation 05/16/2013   Anemia of other chronic disease 05/16/2013   Mental disorder 01/03/2013   Addisons disease ([place]) 01/03/2013   Hypokalemia 11/25/2011   Thrombocytopenia ([place]) 11/25/2011   FTT (failure to thrive) in adult 11/25/2011   Grand mal seizure ([place]) 11/23/2011   Depression (emotion) 11/23/2011   History of atrial fibrillation 11/23/2011   Other specified hemorrhagic conditions ([place]) 11/23/2011    Glaucoma 11/23/2011   Hypoglycemia 11/23/2011   Chronic constipation 11/23/2011   Anoxic brain injury ([place]) 11/12/2011   Protein calorie malnutrition ([place]) 11/12/2011   Hyperchloremia 11/12/2011   Hyperthyroidism 11/12/2011   Acute lower UTI 11/12/2011   Dysphagia     Orientation RESPIRATION BLADDER Height & Weight     Self  O2 Incontinent Weight: 138 lb 7.2 oz (62.8 kg) Height:  5\' 8"  (172.7 cm)  BEHAVIORAL SYMPTOMS/MOOD NEUROLOGICAL BOWEL NUTRITION STATUS  Other (Comment) (intermittent agitation)   Incontinent Diet (SLP Diet Recommendations: Dysphagia 1 (Puree) solids;Honey thick liquids)  AMBULATORY STATUS COMMUNICATION OF NEEDS Skin   Extensive Assist Verbally                         Personal Care Assistance Level of Assistance  Bathing, Feeding, Dressing Bathing Assistance: Limited assistance Feeding assistance: Limited assistance Dressing Assistance: Maximum assistance     Functional Limitations Info  Hearing, Speech, Sight Sight Info: Adequate Hearing Info: Adequate Speech Info: Impaired    SPECIAL CARE FACTORS FREQUENCY                       Contractures Contractures Info: Not present    Additional Factors Info  Code Status Code Status Info: Partial Code - Set by Kipp Brood, MD at 10/21/2022 2125 (View report)  Question Answer  In the event of cardiac or respiratory ARREST: Initiate Code Blue, Call Rapid Response No  In the event of cardiac or respiratory ARREST: Perform CPR No  In the event of cardiac or respiratory ARREST: Perform Intubation/Mechanical Ventilation Yes  In the event of cardiac  or respiratory ARREST: Use NIPPV/BiPAp only if indicated Yes  In the event of cardiac or respiratory ARREST: Administer ACLS medications if indicated No  In the event of cardiac or respiratory ARREST: Perform Defibrillation or Cardioversion if indicated No             Current Medications (10/29/2022):  This is the current hospital active medication  list Current Facility-Administered Medications  Medication Dose Route Frequency Provider Last Rate Last Admin   acetaminophen (TYLENOL) 160 MG/5ML solution 650 mg  650 mg Per Tube Q6H PRN Karl Ito, MD   650 mg at 10/28/22 2256   Chlorhexidine Gluconate Cloth 2 % PADS 6 each  6 each Topical Daily Lynnell Catalan, MD   6 each at 10/28/22 2259   clonazePAM (KLONOPIN) tablet 0.5 mg  0.5 mg Per Tube BID Steffanie Rainwater, MD   0.5 mg at 10/29/22 0953   dextrose 10 % infusion   Intravenous Continuous Martina Sinner, MD 20 mL/hr at 10/29/22 0700 Infusion Verify at 10/29/22 0700   enoxaparin (LOVENOX) injection 40 mg  40 mg Subcutaneous Q24H Steffanie Rainwater, MD   40 mg at 10/29/22 0949   feeding supplement (OSMOLITE 1.5 CAL) liquid 1,000 mL  1,000 mL Per Tube Continuous Violeta Gelinas, MD 50 mL/hr at 10/29/22 0700 Infusion Verify at 10/29/22 0700   feeding supplement (PROSource TF20) liquid 60 mL  60 mL Per Tube Daily Violeta Gelinas, MD   60 mL at 10/29/22 0950   guaiFENesin (ROBITUSSIN) 100 MG/5ML liquid 5 mL  5 mL Per Tube Q4H Melody Comas B, MD   5 mL at 10/29/22 0803   haloperidol (HALDOL) tablet 5 mg  5 mg Per Tube QPM Steffanie Rainwater, MD   5 mg at 10/28/22 1700   ipratropium-albuterol (DUONEB) 0.5-2.5 (3) MG/3ML nebulizer solution 3 mL  3 mL Nebulization Q6H PRN Lynnell Catalan, MD       levothyroxine (SYNTHROID) tablet 225 mcg  225 mcg Per Tube D2202 Steffanie Rainwater, MD   225 mcg at 10/29/22 0514   LORazepam (ATIVAN) injection 1 mg  1 mg Intravenous Q4H PRN Karl Ito, MD       midodrine (PROAMATINE) tablet 10 mg  10 mg Per Tube Q8H Steffanie Rainwater, MD   10 mg at 10/29/22 0307   ondansetron (ZOFRAN) tablet 4 mg  4 mg Per Tube Q6H PRN Steffanie Rainwater, MD   4 mg at 10/27/22 1042   Oral care mouth rinse  15 mL Mouth Rinse PRN Cheri Fowler, MD       polyethylene glycol (MIRALAX / GLYCOLAX) packet 17 g  17 g Per Tube Daily PRN Martina Sinner, MD        senna-docusate (Senokot-S) tablet 1 tablet  1 tablet Per Tube BID PRN Martina Sinner, MD       sertraline (ZOLOFT) tablet 75 mg  75 mg Per Tube Daily Steffanie Rainwater, MD   75 mg at 10/29/22 0950   sodium chloride HYPERTONIC 3 % nebulizer solution 4 mL  4 mL Nebulization BID Melody Comas B, MD   4 mL at 10/29/22 0758   valproic acid (DEPAKENE) 250 MG/5ML solution 500 mg  500 mg Per Tube TID Steffanie Rainwater, MD   500 mg at 10/29/22 0803   vancomycin (VANCOREADY) IVPB 750 mg/150 mL  750 mg Intravenous Q12H von Pearletha Furl, RPH   Stopped at 10/29/22 0046     Discharge Medications: Please see  discharge summary for a list of discharge medications.  Relevant Imaging Results:  Relevant Lab Results:   Additional Information ssn: [id number]  Deatra Robinson, [place]

## 2022-10-29 NOTE — Plan of Care (Signed)

## 2022-10-30 ENCOUNTER — Inpatient Hospital Stay (HOSPITAL_COMMUNITY): Payer: 59

## 2022-10-30 ENCOUNTER — Inpatient Hospital Stay: Payer: Self-pay

## 2022-10-30 DIAGNOSIS — E440 Moderate protein-calorie malnutrition: Secondary | ICD-10-CM | POA: Diagnosis not present

## 2022-10-30 DIAGNOSIS — Z789 Other specified health status: Secondary | ICD-10-CM | POA: Diagnosis not present

## 2022-10-30 DIAGNOSIS — J189 Pneumonia, unspecified organism: Secondary | ICD-10-CM | POA: Diagnosis not present

## 2022-10-30 DIAGNOSIS — G931 Anoxic brain damage, not elsewhere classified: Secondary | ICD-10-CM | POA: Diagnosis not present

## 2022-10-30 LAB — CBC
HCT: 28.8 % — ABNORMAL LOW (ref 39.0–52.0)
Hemoglobin: 9.4 g/dL — ABNORMAL LOW (ref 13.0–17.0)
MCH: 28.8 pg (ref 26.0–34.0)
MCHC: 32.6 g/dL (ref 30.0–36.0)
MCV: 88.3 fL (ref 80.0–100.0)
Platelets: ADEQUATE 10*3/uL (ref 150–400)
RBC: 3.26 MIL/uL — ABNORMAL LOW (ref 4.22–5.81)
RDW: 14.4 % (ref 11.5–15.5)
WBC: 7.4 10*3/uL (ref 4.0–10.5)
nRBC: 0 % (ref 0.0–0.2)

## 2022-10-30 LAB — GLUCOSE, RANDOM: Glucose, Bld: 69 mg/dL — ABNORMAL LOW (ref 70–99)

## 2022-10-30 LAB — BASIC METABOLIC PANEL
Anion gap: 7 (ref 5–15)
BUN: 11 mg/dL (ref 6–20)
CO2: 28 mmol/L (ref 22–32)
Calcium: 8 mg/dL — ABNORMAL LOW (ref 8.9–10.3)
Chloride: 100 mmol/L (ref 98–111)
Creatinine, Ser: 0.7 mg/dL (ref 0.61–1.24)
GFR, Estimated: >60 mL/min (ref >60)
Glucose, Bld: 106 mg/dL — ABNORMAL HIGH (ref 70–99)
Potassium: 4.6 mmol/L (ref 3.5–5.1)
Sodium: 135 mmol/L (ref 135–145)

## 2022-10-30 LAB — GLUCOSE, CAPILLARY
Glucose-Capillary: 55 mg/dL — ABNORMAL LOW (ref 70–99)
Glucose-Capillary: 127 mg/dL — ABNORMAL HIGH (ref 70–99)
Glucose-Capillary: 57 mg/dL — ABNORMAL LOW (ref 70–99)
Glucose-Capillary: 56 mg/dL — ABNORMAL LOW (ref 70–99)
Glucose-Capillary: 68 mg/dL — ABNORMAL LOW (ref 70–99)
Glucose-Capillary: 65 mg/dL — ABNORMAL LOW (ref 70–99)
Glucose-Capillary: 69 mg/dL — ABNORMAL LOW (ref 70–99)
Glucose-Capillary: 56 mg/dL — ABNORMAL LOW (ref 70–99)
Glucose-Capillary: 69 mg/dL — ABNORMAL LOW (ref 70–99)
Glucose-Capillary: 72 mg/dL (ref 70–99)
Glucose-Capillary: 85 mg/dL (ref 70–99)

## 2022-10-30 MED ORDER — DEXTROSE 50 % IV SOLN: 25.0000 g | INTRAVENOUS | Status: AC

## 2022-10-30 MED ORDER — DEXTROSE 50 % IV SOLN
INTRAVENOUS | Status: AC
  Filled 2022-10-30: qty 50

## 2022-10-30 MED ORDER — DEXTROSE 50 % IV SOLN
INTRAVENOUS | Status: AC
  Administered 2022-10-30: 25 g via INTRAVENOUS
  Filled 2022-10-30: qty 50

## 2022-10-30 MED ORDER — SODIUM CHLORIDE 0.9% FLUSH: 10.0000 mL | INTRAVENOUS | Status: DC | PRN

## 2022-10-30 MED ORDER — DEXTROSE 50 % IV SOLN
INTRAVENOUS | Status: AC
  Administered 2022-10-30: 25 mL
  Filled 2022-10-30: qty 50

## 2022-10-30 MED ORDER — DEXTROSE 50 % IV SOLN
12.5000 g | INTRAVENOUS | Status: AC
  Administered 2022-10-30: 12.5 g via INTRAVENOUS
  Filled 2022-10-30: qty 50

## 2022-10-30 MED ORDER — SODIUM CHLORIDE 0.9% FLUSH
10.0000 mL | Freq: Two times a day (BID) | INTRAVENOUS | Status: DC
  Administered 2022-10-30: 20 mL
  Administered 2022-11-01 – 2022-11-06 (×12): 10 mL

## 2022-10-30 MED ORDER — DEXTROSE 50 % IV SOLN
12.5000 g | INTRAVENOUS | Status: AC
  Administered 2022-10-30: 12.5 g via INTRAVENOUS

## 2022-10-30 MED ORDER — SODIUM CHLORIDE 0.9 % IV SOLN
2.0000 g | Freq: Three times a day (TID) | INTRAVENOUS | Status: AC
  Administered 2022-10-30 – 2022-11-04 (×17): 2 g via INTRAVENOUS
  Filled 2022-10-30 (×17): qty 12.5

## 2022-10-30 MED ORDER — ALBUMIN HUMAN 25 % IV SOLN
12.5000 g | Freq: Four times a day (QID) | INTRAVENOUS | Status: AC
  Administered 2022-10-30 – 2022-11-01 (×8): 12.5 g via INTRAVENOUS
  Filled 2022-10-30 (×8): qty 50

## 2022-10-30 MED ORDER — ALBUMIN HUMAN 25 % IV SOLN
25.0000 g | Freq: Once | INTRAVENOUS | Status: AC
  Administered 2022-10-30: 25 g via INTRAVENOUS
  Filled 2022-10-30: qty 100

## 2022-10-30 NOTE — Progress Notes (Signed)
Attempted x 2 to contact with legal guardian for PICC consent with no answer. Will try again later.

## 2022-10-30 NOTE — Progress Notes (Addendum)
Evaluated patient at the request of hospitalist for possible central line placement.  On chart review, patient has stenotic vessels requiring IR to place PICC lines dating back to 2013. IV team attempted PICC line x1 on LUA earlier today and unable to pass through axillary.  Patient also has noncompressible cephalic vein and no visible left basilic vein.  Patient has 1 peripheral line and currently of IV Vanc, cefepime and D10 infusion. Patient will need IR consult to place PICC line or central line.  Regarding patient's blood pressure, patient has baseline soft BPs. He is not in septic shock on my evaluation. MAPs consistently above 60. Would recommend going up on his midodrine and giving IV fluid boluses to help with his soft BPs.

## 2022-10-30 NOTE — Progress Notes (Addendum)
Pharmacy Antibiotic Note  [name] is a 56 y.o. male admitted on 10/21/2022 with pneumonia.  Pharmacy has been consulted for cefepime dosing. CXR (11/3) shows small pleural effusions + bibasilar lung opacities. WBC WNL, afeb (w PRN APAP). Per RN report, likely aspiration last night, now with increased secretions.   Plan: Cefepime 2g IV q8h Continue vancomycin 750mg  IV q 12h  -last AUC check 10/31 was 532-therapeutic, consider recheck levels if renal func changes significantly  F/u renal func, cultures as able   Height: 5\' 8"  (172.7 cm) Weight: 62.8 kg (138 lb 7.2 oz) IBW/kg (Calculated) : 68.4  Temp (24hrs), Avg:98 F (36.7 C), Min:97.2 F (36.2 C), Max:98.7 F (37.1 C)  Recent Labs  Lab 10/25/22 0702 10/26/22 0628 10/26/22 2311 10/27/22 1037 10/28/22 0223 10/29/22 0259 10/30/22 0828  WBC 11.2* 10.5  --   --  6.4 7.1 7.4  CREATININE  --  0.68  --  0.78 0.70 0.70 0.70  VANCOTROUGH  --   --   --  17  --   --   --   VANCOPEAK  --   --  29*  --   --   --   --     Estimated Creatinine Clearance: 92.7 mL/min (by C-G formula based on SCr of 0.7 mg/dL).    Allergies  Allergen Reactions   Ativan [Lorazepam] Other (See Comments)    Tolerated 09/2022 admission   Latex Dermatitis and Other (See Comments)    "Allergic," per MAR   Morphine And Related Other (See Comments)    Reaction not listed on MAR   Other Other (See Comments)    Soy Sauce  Reaction not listed on MAR   Penicillins Other (See Comments)    Patient tolerated cephalosporins in past. "Allergic," per MAR.  Has patient had a PCN reaction causing immediate rash, facial/tongue/throat swelling, SOB or lightheadedness with hypotension: unknown Has patient had a PCN reaction causing severe rash involving mucus membranes or skin necrosis: unknown Has patient had a PCN reaction that required hospitalization: no Has patient had a PCN reaction occurring within the last 10 years: yes If all of the above answe    Pyridium [Phenazopyridine Hcl] Other (See Comments)    Reaction not listed on MAR   Soy Allergy Other (See Comments)    Reaction not listed on MAR   Soybean (Diagnostic) Other (See Comments)    Reaction not listed no MAR    Antimicrobials this admission: Vanc 10/25 >> 10/26; 10/28 >> 11/6 Cefepime 10/26 >>10/27, 11/3> Azith 10/26 >>10/27 CTX 10/27>> 10/30  Microbiology results: 10/30 abd Wound: GPC in clusters after I&D 10/28 abd Wound: MRSA 10/26 Resp Cx: nf 10/26 Ucx: negative 10/25 Bcx: Neg  Thank you for allowing pharmacy to be a part of this patient's care.  Wilson Singer, PharmD Clinical Pharmacist 10/30/2022 9:48 AM

## 2022-10-30 NOTE — Progress Notes (Signed)
Patient ID: [name], male   DOB: 03/12/1966, 56 y.o.   MRN: [id number]      Subjective: No complaints ROS negative except as listed above. Objective: Vital signs in last 24 hours: Temp:  [97.2 F (36.2 C)-98.7 F (37.1 C)] 97.9 F (36.6 C) (11/03 0800) Pulse Rate:  [48-76] 55 (11/03 0600) Resp:  [13-28] 17 (11/03 0600) BP: (75-115)/(49-80) 91/51 (11/03 0600) SpO2:  [81 %-100 %] 100 % (11/03 0600) Last BM Date : 10/29/22  Intake/Output from previous day: 11/02 0701 - 11/03 0700 In: 1395.4 [P.O.:240; I.V.:320.3; NG/GT:685; IV Piggyback:150.1] Out: 2750 [Urine:2750] Intake/Output this shift: No intake/output data recorded.  General appearance: cooperative GI: soft, wound iodoform packing changed, still some seropurulent drainage  Lab Results: CBC  Recent Labs    10/29/22 0259 10/30/22 0828  WBC 7.1 7.4  HGB 10.7* 9.4*  HCT 33.0* 28.8*  PLT 226 PLATELET CLUMPS NOTED ON SMEAR, COUNT APPEARS ADEQUATE   BMET Recent Labs    10/29/22 0259 10/30/22 0828  NA 136 135  K 4.7 4.6  CL 102 100  CO2 27 28  GLUCOSE 87 106*  BUN 12 11  CREATININE 0.70 0.70  CALCIUM 8.0* 8.0*   PT/INR No results for input(s): "LABPROT", "INR" in the last 72 hours. ABG No results for input(s): "PHART", "HCO3" in the last 72 hours.  Invalid input(s): "PCO2", "PO2"  Studies/Results:   Anti-infectives: Anti-infectives (From admission, onward)    Start     Dose/Rate Route Frequency Ordered Stop   10/29/22 0000  vancomycin (VANCOREADY) IVPB 750 mg/150 mL        750 mg 150 mL/hr over 60 Minutes Intravenous Every 12 hours 10/28/22 1425 11/02/22 2359   10/27/22 1315  vancomycin (VANCOCIN) IVPB 1000 mg/200 mL premix  Status:  Discontinued        1,000 mg 200 mL/hr over 60 Minutes Intravenous Every 12 hours 10/27/22 1224 10/28/22 1425   10/27/22 1230  vancomycin (VANCOCIN) IVPB 1000 mg/200 mL premix  Status:  Discontinued        1,000 mg 200 mL/hr over 60 Minutes Intravenous Every 12  hours 10/27/22 1222 10/27/22 1223   10/24/22 2200  vancomycin (VANCOCIN) IVPB 1000 mg/200 mL premix  Status:  Discontinued        1,000 mg 200 mL/hr over 60 Minutes Intravenous Every 12 hours 10/24/22 1138 10/27/22 1222   10/24/22 1230  vancomycin (VANCOREADY) IVPB 1250 mg/250 mL        1,250 mg 166.7 mL/hr over 90 Minutes Intravenous  Once 10/24/22 1138 10/24/22 1431   10/23/22 1130  cefTRIAXone (ROCEPHIN) 2 g in sodium chloride 0.9 % 100 mL IVPB        2 g 200 mL/hr over 30 Minutes Intravenous Every 24 hours 10/23/22 1041 10/26/22 1324   10/22/22 2200  ceFEPIme (MAXIPIME) 2 g in sodium chloride 0.9 % 100 mL IVPB  Status:  Discontinued        2 g 200 mL/hr over 30 Minutes Intravenous Every 8 hours 10/22/22 1559 10/23/22 1041   10/22/22 0900  vancomycin (VANCOREADY) IVPB 750 mg/150 mL  Status:  Discontinued        750 mg 150 mL/hr over 60 Minutes Intravenous Every 12 hours 10/21/22 2041 10/22/22 1559   10/22/22 0700  ceFEPIme (MAXIPIME) 2 g in sodium chloride 0.9 % 100 mL IVPB  Status:  Discontinued        2 g 200 mL/hr over 30 Minutes Intravenous Every 12 hours 10/21/22 2041 10/22/22 1559  10/21/22 1900  vancomycin (VANCOREADY) IVPB 1750 mg/350 mL        1,750 mg 175 mL/hr over 120 Minutes Intravenous  Once 10/21/22 1854 10/21/22 2125   10/21/22 1900  ceFEPIme (MAXIPIME) 2 g in sodium chloride 0.9 % 100 mL IVPB        2 g 200 mL/hr over 30 Minutes Intravenous  Once 10/21/22 1854 10/21/22 1924   10/21/22 1900  azithromycin (ZITHROMAX) 500 mg in sodium chloride 0.9 % 250 mL IVPB  Status:  Discontinued        500 mg 250 mL/hr over 60 Minutes Intravenous Every 24 hours 10/21/22 1854 10/23/22 1041   10/21/22 1845  vancomycin (VANCOCIN) IVPB 1000 mg/200 mL premix  Status:  Discontinued        1,000 mg 200 mL/hr over 60 Minutes Intravenous  Once 10/21/22 1835 10/21/22 1851   10/21/22 1845  levofloxacin (LEVAQUIN) IVPB 750 mg  Status:  Discontinued        750 mg 100 mL/hr over 90 Minutes  Intravenous  Once 10/21/22 1835 10/21/22 1849       Assessment/Plan: Right abdominal wall abscess History of infected mesh - s/p bedside I&D 10/30 - culture: MRSA, on Vancomycin, (also on cefepime for PNA) - daily packing changes with 1/4 inch iodoform. Dry dressing over top, change PRN saturation  ID - above FEN - TF VTE - lovenox Foley - none  Right lumbar hernia Acute respiratory failure - extubated 10/28  Bradycardia  Sepsis Pneumonia  H/o adrenal insufficiency Hx anoxic brain injury, TBI    LOS: 9 days    Violeta Gelinas, MD, MPH, FACS Trauma & General Surgery Use [email or web address] to contact on call provider  10/30/2022

## 2022-10-30 NOTE — Progress Notes (Signed)
Dr. [facility] made aware of pt's low CBG readings. Pending lab draw glucose. MD notified of lab's difficulty with sticking the pt for routine labs.

## 2022-10-30 NOTE — Progress Notes (Signed)
Secure chat sent to nurse and Dr. [facility] regarding IV placements. This patient has had 7 PIVs in 8 days. VAST attempted PICC placement. At this time VAST recommends CVC placement d/t frequent PIV re-starts and poor vasculature. Fran Lowes, RN [facility]AST

## 2022-10-30 NOTE — Progress Notes (Signed)
Physical Therapy Treatment Patient Details Name: [name] MRN: [id number] DOB: 1966-01-17 Today's Date: 10/30/2022   History of Present Illness Pt is a 56 y.o. male who presented 10/21/22 with acute hypoxic respiratory failure in the setting of bilateral lower lobe pneumonia. ETT 10/25-10/28. Found to have large abscess on the R abdominal wall and a moderate to large right lumbar hernia that was not incarcerated, may plan for surgery 10/30. PMH: cardiac arrest with anoxic brain injury, Addison disease, afib, dementia, dysphagia, UTIs, HLD, hypothyroidism, seizure    PT Comments    Patient lethargic upon PT arrival. Follows simple commands with increased time but keeps eyes closed for most of session. Able to assist with rolling in bed with max multimodal cues with Mod A. Attempted to get sitting EOB with pt assisting but then once halfway up, pt resisting movement and trying to return to supine. Participated in there ex with max stimulus. Continues to have soft BP. Will follow.    Recommendations for follow up therapy are one component of a multi-disciplinary discharge planning process, led by the attending physician.  Recommendations may be updated based on patient status, additional functional criteria and insurance authorization.  Follow Up Recommendations  Skilled nursing-short term rehab (<3 hours/day) Can patient physically be transported by private vehicle: No   Assistance Recommended at Discharge Frequent or constant Supervision/Assistance  Patient can return home with the following Two people to help with walking and/or transfers;A lot of help with bathing/dressing/bathroom;Assistance with feeding;Assistance with cooking/housework;Direct supervision/assist for medications management;Direct supervision/assist for financial management;Assist for transportation;Help with stairs or ramp for entrance   Equipment Recommendations  None recommended by PT    Recommendations for Other  Services       Precautions / Restrictions Precautions Precautions: Fall Precaution Comments: watch BP and 02 Restrictions Weight Bearing Restrictions: No     Mobility  Bed Mobility Overal bed mobility: Needs Assistance Bed Mobility: Rolling, Sidelying to Sit Rolling: Mod assist Sidelying to sit: Max assist, +2 for physical assistance, HOB elevated       General bed mobility comments: Mod A to roll in both directions with cues to reach with UEs, flex knee and turn head, reaching for rail as well. Able to assist with cues. Attempted to get sitting upright but pt initially helping but then resisted all movement and trying to return to sidelying.    Transfers                   General transfer comment: Deferred due to pt resisting and returning to supine    Ambulation/Gait                   Stairs             Wheelchair Mobility    Modified Rankin (Stroke Patients Only)       Balance                                            Cognition Arousal/Alertness: Lethargic Behavior During Therapy: Flat affect Overall Cognitive Status: Difficult to assess                                 General Comments: Eyes remained closed for most of time, nods yes/no occasionally but follow simple commands.  Exercises General Exercises - Lower Extremity Heel Slides: AAROM, Both, 10 reps, Supine Hip Flexion/Marching: AAROM, Both, 10 reps, Supine    General Comments General comments (skin integrity, edema, etc.): BP soft, 83/57 (67).      Pertinent Vitals/Pain Pain Assessment Pain Assessment: Faces Faces Pain Scale: No hurt    Home Living                          Prior Function            PT Goals (current goals can now be found in the care plan section) Progress towards PT goals: Not progressing toward goals - comment (due to lethargy)    Frequency    Min 2X/week      PT Plan Current plan  remains appropriate    Co-evaluation              AM-PAC PT "6 Clicks" Mobility   Outcome Measure  Help needed turning from your back to your side while in a flat bed without using bedrails?: A Lot Help needed moving from lying on your back to sitting on the side of a flat bed without using bedrails?: A Lot Help needed moving to and from a bed to a chair (including a wheelchair)?: Total Help needed standing up from a chair using your arms (e.g., wheelchair or bedside chair)?: Total Help needed to walk in hospital room?: Total Help needed climbing 3-5 steps with a railing? : Total 6 Click Score: 8    End of Session Equipment Utilized During Treatment: Oxygen Activity Tolerance: Patient limited by lethargy Patient left: in bed;with call bell/phone within reach;with bed alarm set Nurse Communication: Need for lift equipment;Mobility status PT Visit Diagnosis: Unsteadiness on feet (R26.81);Muscle weakness (generalized) (M62.81);Difficulty in walking, not elsewhere classified (R26.2);Other symptoms and signs involving the nervous system (R29.898)     Time: 1036-1050 PT Time Calculation (min) (ACUTE ONLY): 14 min  Charges:  $Therapeutic Activity: 8-22 mins                     Marisa Severin, PT, DPT Acute Rehabilitation Services Secure chat preferred Office [phone]      [name] 10/30/2022, 10:57 AM

## 2022-10-30 NOTE — Progress Notes (Signed)
Dr. [facility] made aware of pt's low lab draw glucose reading despite being on a d10 drip. Restarting TF at this time

## 2022-10-30 NOTE — Procedures (Signed)
Central Venous Catheter Insertion Procedure Note  [name]  [id number]  Mar 09, 1966  Date:10/30/22  Time:5:25 PM   Provider Performing:Carolynne Schuchard R Safiyyah Vasconez   Procedure: Insertion of Non-tunneled Central Venous [phone]) with US guidance (50354)   Indication(s) Difficult access  Consent Risks of the procedure as well as the alternatives and risks of each were explained to the patient and/or caregiver.  Consent for the procedure was obtained and is signed in the bedside chart discussed with Guardian and consent obtained  Anesthesia Topical only with 1% lidocaine   Timeout Verified patient identification, verified procedure, site/side was marked, verified correct patient position, special equipment/implants available, medications/allergies/relevant history reviewed, required imaging and test results available.  Sterile Technique Maximal sterile technique including full sterile barrier drape, hand hygiene, sterile gown, sterile gloves, mask, hair covering, sterile ultrasound probe cover (if used).  Procedure Description Area of catheter insertion was cleaned with chlorhexidine and draped in sterile fashion.  With real-time ultrasound guidance a central venous catheter was placed into the right femoral vein. Nonpulsatile blood flow and easy flushing noted in all ports.  The catheter was sutured in place and sterile dressing applied.  Complications/Tolerance None; patient tolerated the procedure well. Chest X-ray is ordered to verify placement for internal jugular or subclavian cannulation.   Chest x-ray is not ordered for femoral cannulation.  EBL Minimal  Specimen(s) None  [name] Honesty Menta, PA-C

## 2022-10-30 NOTE — Progress Notes (Signed)
PROGRESS NOTE    [name]  ZOX:[id number] DOB: 04/04/1966 DOA: 10/21/2022 PCP: No primary care provider on file.    Chief Complaint  Patient presents with   Respiratory Arrest    Brief Narrative:   56 year old man admitted from [facility] with significant for Anoxic Brain injury secondary to remote cardiac arrest, PEG dependent(removed), CAD, hypothyroidism, seizure disorder, paroxysmal A. fib not on systemic anticoagulation and adrenal insufficiency  -Patient presents from SNF hypoxic in the 70s, agitated, likely with aspiration event, required intubation and admission to ICU. ETT 10/25-10/28. Found to have large abscess on the R abdominal wall s/p bedside I&D by surgery ,  Patient improved, successfully extubated and transferred to [facility] 10/29/2022.    Assessment & Plan:   Principal Problem:   Respiratory failure (HCC) Active Problems:   Anoxic brain injury (HCC)   Protein calorie malnutrition (HCC)   Cardiomyopathy, ischemic   Quadriplegia (HCC)   Acute hypoxemic respiratory failure (HCC)   Pneumonia of right lower lobe due to infectious organism   Malnutrition of moderate degree   Pressure injury of skin   Septic shock 2/2 pneumonia HCAP Intra-abdominal abscess -Septic shock present on admission, requiring pressor support -Pathophysiology of sepsis has resolved, off pressor for 24 hours. -Continue vancomycin -Continue midodrine 10 mg q8h -Patient with aspiration event overnight, will add back on cefepime -Pressure remains soft, will add scheduled albumin   Acute respiratory failure with hypoxia (HCC) Respiratory acidosis, resolved -Currently extubated, requiring nasal cannula. - Was encouraged to use incentive spirometry and flutter valve. -Start hypertonic 3% nebulized solution -Start guaifenesin per tube to help loosen secretions -Continue O2 supplementation, wean for O2 sat >92% -Discontinue scopolamine patch   Hypoglycemia -Continue with  D10 LR  -Now he is n.p.o. given his hydration event, will resume back on tube feed via core track   R abdominal wall abscess Hx of abdominal hernia CT A/P showed enlarging 10.7 x 8.6 x 7.5 cm abnormal fluid collection concerning for possible abscess.  Successful bedside I&D by CCS on 10/30.  Mild tenderness around the incision site but no complications -CCS following, appreciate recs -Continue vancomycin, de-escalate based on wound culture and sensitivities -Dressing changes per CCS   Parox A-fib, resolved Sinus brady Remains in NSR but has had intermittent bradycardia for the last 48 hr. CHADVAs score of 2. No electrolyte abnormalities. TSH within normal limits. Patient is at a significant risk of falls at baseline.  Patient's risk of bleeding outweighs his risk of stroke so we will hold off on long-term anticoagulation.  HR has been stable in the 60s. -Telemetry -Lovenox for DVT prophylaxis -Trend and replete electrolytes   Cardiomyopathy, ischemic Last TTE 09/2017 showed EF 60 to 65% and akinesis of the basal inferior myocardium. Repeat TTE showed EF 50 to 55%, with global hypokinesis but no valvular abnormalities. -Monitor and replete electrolytes -Strict I&O's   Hypothyroidism Repeat stable at TSH 5.5. -Continue Synthroid to 225 mcg daily   Protein calorie malnutrition (HCC) -Cortrak placed on 10/30.  -Continue TF for now -N.p.o. due to aspiration event overnight.   Hx of anoxic brain injury, TBI Seizure disorder -Continue home Haldol per tube -Continue valproic acid per tube -Continue BID Klonopin and PRN ativan for agitation  Body mass index is 21.05 kg/m.  DVT prophylaxis: Lovenox Code Status: (Partial) Family Communication: None at bedside  Status is: Inpatient    Consultants:  PCCM General surgery   Subjective:  No significant events overnight as discussed with  staff, was able to tolerate oral intake with full assist.  Objective: Vitals:   10/30/22  0930 10/30/22 0945 10/30/22 1000 10/30/22 1015  BP: (!) 88/61  (!) 88/58 100/61  Pulse: (!) 57 (!) 52 (!) 54 67  Resp: 20 20 20 11   Temp:      TempSrc:      SpO2: 100% 100% 100% 98%  Weight:      Height:        Intake/Output Summary (Last 24 hours) at 10/30/2022 1117 Last data filed at 10/30/2022 0800 Gross per 24 hour  Intake 1265.33 ml  Output 3050 ml  Net -1784.67 ml   Filed Weights   10/26/22 0500 10/27/22 0500 10/29/22 0500  Weight: 68.4 kg 61.7 kg 62.8 kg    Examination:  Awake, less responsive and communicative today, chronically ill-appearing, cachectic, appears older than stated age  Diminished air entry at the bases with Rales  Regular rate and rhythm, no rubs or gallops  Abdomen soft, right abdomen bandaged  Extremity with mild edema    Data Reviewed: I have personally reviewed following labs and imaging studies  CBC: Recent Labs  Lab 10/25/22 0702 10/25/22 2241 10/26/22 0628 10/28/22 0223 10/29/22 0259 10/30/22 0828  WBC 11.2*  --  10.5 6.4 7.1 7.4  HGB 10.1* 8.8* 11.6* 10.3* 10.7* 9.4*  HCT 31.4* 26.0* 35.6* 30.9* 33.0* 28.8*  MCV 88.2  --  86.4 87.5 88.7 88.3  PLT 223  --  231 198 226 PLATELET CLUMPS NOTED ON SMEAR, COUNT APPEARS ADEQUATE    Basic Metabolic Panel: Recent Labs  Lab 10/24/22 0600 10/25/22 2241 10/26/22 0628 10/27/22 1037 10/28/22 0223 10/29/22 0259 10/30/22 0828  NA  --    < > 136 138 137 136 135  K  --    < > 4.2 4.1 4.2 4.7 4.6  CL  --   --  103 105 104 102 100  CO2  --   --  22 24 25 27 28   GLUCOSE  --   --  90 98 72 87 106*  BUN  --   --  10 12 12 12 11   CREATININE  --   --  0.68 0.78 0.70 0.70 0.70  CALCIUM  --   --  8.1* 7.8* 7.8* 8.0* 8.0*  MG 2.2  --   --   --   --   --   --    < > = values in this interval not displayed.    GFR: Estimated Creatinine Clearance: 92.7 mL/min (by C-G formula based on SCr of 0.7 mg/dL).  Liver Function Tests: Recent Labs  Lab 10/28/22 0223  AST 42*  ALT 16  ALKPHOS 50   BILITOT 0.5  PROT 5.0*  ALBUMIN 1.6*    CBG: Recent Labs  Lab 10/30/22 0453 10/30/22 0606 10/30/22 0654 10/30/22 0758 10/30/22 0930  GLUCAP 127* 56* 68* 65* 69*     Recent Results (from the past 240 hour(s))  Blood Culture (routine x 2)     Status: None   Collection Time: 10/21/22  6:33 PM   Specimen: BLOOD RIGHT HAND  Result Value Ref Range Status   Specimen Description BLOOD RIGHT HAND  Final   Special Requests   Final    BOTTLES DRAWN AEROBIC AND ANAEROBIC Blood Culture adequate volume   Culture   Final    NO GROWTH 5 DAYS Performed at [facility], [address] [place], [facility] [address]    Report Status 10/26/2022  FINAL  Final  Blood Culture (routine x 2)     Status: None   Collection Time: 10/21/22  7:04 PM   Specimen: BLOOD RIGHT FOREARM  Result Value Ref Range Status   Specimen Description BLOOD RIGHT FOREARM  Final   Special Requests   Final    BOTTLES DRAWN AEROBIC AND ANAEROBIC Blood Culture adequate volume   Culture   Final    NO GROWTH 5 DAYS Performed at [facility], [address], [place], [place] [address]    Report Status 10/26/2022 FINAL  Final  Resp Panel by RT-PCR (Flu A&B, Covid) Anterior Nasal Swab     Status: None   Collection Time: 10/21/22  7:07 PM   Specimen: Anterior Nasal Swab  Result Value Ref Range Status   SARS Coronavirus 2 by RT PCR NEGATIVE NEGATIVE Final    Comment: (NOTE) SARS-CoV-2 target nucleic acids are NOT DETECTED.  The SARS-CoV-2 RNA is generally detectable in upper respiratory specimens during the acute phase of infection. The lowest concentration of SARS-CoV-2 viral copies this assay can detect is 138 copies/mL. A negative result does not preclude SARS-Cov-2 infection and should not be used as the sole basis for treatment or other patient management decisions. A negative result may occur with  improper specimen collection/handling, submission of specimen other than nasopharyngeal swab, presence of  viral mutation(s) within the areas targeted by this assay, and inadequate number of viral copies(<138 copies/mL). A negative result must be combined with clinical observations, patient history, and epidemiological information. The expected result is Negative.  Fact Sheet for Patients:  BloggerCourse.com  Fact Sheet for Healthcare Providers:  SeriousBroker.it  This test is no t yet approved or cleared by the [place] FDA and  has been authorized for detection and/or diagnosis of SARS-CoV-2 by FDA under an Emergency Use Authorization (EUA). This EUA will remain  in effect (meaning this test can be used) for the duration of the COVID-19 declaration under Section 564(b)(1) of the Act, 21 U.S.C.section 360bbb-3(b)(1), unless the authorization is terminated  or revoked sooner.       Influenza A by PCR NEGATIVE NEGATIVE Final   Influenza B by PCR NEGATIVE NEGATIVE Final    Comment: (NOTE) The Xpert Xpress SARS-CoV-2/FLU/RSV plus assay is intended as an aid in the diagnosis of influenza from Nasopharyngeal swab specimens and should not be used as a sole basis for treatment. Nasal washings and aspirates are unacceptable for Xpert Xpress SARS-CoV-2/FLU/RSV testing.  Fact Sheet for Patients: BloggerCourse.com  Fact Sheet for Healthcare Providers: SeriousBroker.it  This test is not yet approved or cleared by the [place] FDA and has been authorized for detection and/or diagnosis of SARS-CoV-2 by FDA under an Emergency Use Authorization (EUA). This EUA will remain in effect (meaning this test can be used) for the duration of the COVID-19 declaration under Section 564(b)(1) of the Act, 21 U.S.C. section 360bbb-3(b)(1), unless the authorization is terminated or revoked.  Performed at [facility], [address] [place], [place] [address]   Urine Culture     Status: None    Collection Time: 10/22/22 12:39 AM   Specimen: In/Out Cath Urine  Result Value Ref Range Status   Specimen Description IN/OUT CATH URINE  Final   Special Requests NONE  Final   Culture   Final    NO GROWTH Performed at [facility], [address] [place], [place] [address]    Report Status 10/23/2022 FINAL  Final  MRSA  Next Gen by PCR, Nasal     Status: None   Collection Time: 10/22/22 12:45 AM   Specimen: Nasal Mucosa; Nasal Swab  Result Value Ref Range Status   MRSA by PCR Next Gen NOT DETECTED NOT DETECTED Final    Comment: (NOTE) The GeneXpert MRSA Assay (FDA approved for NASAL specimens only), is one component of a comprehensive MRSA colonization surveillance program. It is not intended to diagnose MRSA infection nor to guide or monitor treatment for MRSA infections. Test performance is not FDA approved in patients less than 52 years old. Performed at [facility], [address], [place], [place] [address]   Culture, Respiratory w Gram Stain     Status: None   Collection Time: 10/22/22 12:52 AM   Specimen: Tracheal Aspirate; Respiratory  Result Value Ref Range Status   Specimen Description TRACHEAL ASPIRATE  Final   Special Requests NONE  Final   Gram Stain   Final    FEW WBC PRESENT,BOTH PMN AND MONONUCLEAR ABUNDANT GRAM POSITIVE RODS RARE YEAST WITH PSEUDOHYPHAE    Culture   Final    MODERATE Normal respiratory flora-no Staph aureus or Pseudomonas seen Performed at [facility], [address], [place], [place] [address]    Report Status 10/24/2022 FINAL  Final  Aerobic Culture w Gram Stain (superficial specimen)     Status: None   Collection Time: 10/24/22  6:00 AM   Specimen: Wound  Result Value Ref Range Status   Specimen Description WOUND  Final   Special Requests ABDOMEN  Final   Gram Stain   Final    FEW WBC PRESENT,BOTH PMN AND MONONUCLEAR RARE GRAM POSITIVE COCCI IN SINGLES Performed at [facility], [address],  [place], [place] [address]    Culture FEW METHICILLIN RESISTANT STAPHYLOCOCCUS AUREUS  Final   Report Status 10/26/2022 FINAL  Final   Organism ID, Bacteria METHICILLIN RESISTANT STAPHYLOCOCCUS AUREUS  Final      Susceptibility   Methicillin resistant staphylococcus aureus - MIC*    CIPROFLOXACIN >=8 RESISTANT Resistant     ERYTHROMYCIN >=8 RESISTANT Resistant     GENTAMICIN <=0.5 SENSITIVE Sensitive     OXACILLIN >=4 RESISTANT Resistant     TETRACYCLINE <=1 SENSITIVE Sensitive     VANCOMYCIN 1 SENSITIVE Sensitive     TRIMETH/SULFA <=10 SENSITIVE Sensitive     CLINDAMYCIN >=8 RESISTANT Resistant     RIFAMPIN <=0.5 SENSITIVE Sensitive     Inducible Clindamycin NEGATIVE Sensitive     * FEW METHICILLIN RESISTANT STAPHYLOCOCCUS AUREUS  Aerobic Culture w Gram Stain (superficial specimen)     Status: None   Collection Time: 10/26/22  1:54 PM   Specimen: Wound  Result Value Ref Range Status   Specimen Description WOUND ABDOMEN  Final   Special Requests NONE  Final   Gram Stain   Final    RARE WBC PRESENT,BOTH PMN AND MONONUCLEAR RARE GRAM POSITIVE COCCI IN CLUSTERS    Culture   Final    RARE STAPHYLOCOCCUS AUREUS SUSCEPTIBILITIES PERFORMED ON PREVIOUS CULTURE WITHIN THE LAST 5 DAYS. Performed at [facility] Lab, [address] [place], [place] [address]    Report Status 10/29/2022 FINAL  Final         Radiology Studies: DG CHEST PORT 1 VIEW  Result Date: 10/30/2022 CLINICAL DATA:  Aspiration EXAM: PORTABLE CHEST 1 VIEW COMPARISON:  10/28/2022 chest radiograph. FINDINGS: Enteric tube enters stomach with the tip noted in the right upper quadrant overlying  the region of the pylorus. Stable cardiomediastinal silhouette with normal heart size. No pneumothorax. Small bilateral pleural effusions, right greater than left, similar. Hazy patchy bibasilar lung opacities are similar. No overt pulmonary edema. IMPRESSION: 1. Similar small bilateral pleural effusions, right greater than left. 2.  Similar hazy patchy bibasilar lung opacities. Electronically Signed   By: Delbert Phenix M.D.   On: 10/30/2022 08:52   DG Abd Portable 1V  Result Date: 10/30/2022 CLINICAL DATA:  56 year old male presenting for evaluation of abdominal pain. EXAM: PORTABLE ABDOMEN - 1 VIEW COMPARISON:  October 26, 2022. FINDINGS: Visualized lung bases are clear elevated RIGHT hemidiaphragm. No change in the position of the feeding tube which is either in the distal gastric antrum or pylorus. Barium contrast material administered during swallow study has passed into the colon. Barium transit reaches the rectum. No signs small bowel obstruction. Postoperative changes in the RIGHT upper quadrant. On limited assessment no acute skeletal process. IMPRESSION: 1. No change in the position of the feeding tube which is either in the distal gastric antrum or pylorus. 2. Barium contrast administered during swallow study has passed into the colon. Barium transit reaches the rectum. Negative for acute findings. Electronically Signed   By: Donzetta Kohut M.D.   On: 10/30/2022 08:29   [place] EKG SITE RITE  Result Date: 10/30/2022 If Site Rite image not attached, placement could not be confirmed due to current cardiac rhythm.  DG Swallowing Func-Speech Pathology  Result Date: 10/28/2022 Table formatting from the original result was not included. Objective Swallowing Evaluation: Type of Study: MBS-Modified Barium Swallow Study  Patient Details Name: Dahir Ayer MRN: [id number] Date of Birth: 11/05/1966 Today's Date: 10/28/2022 Time: SLP Start Time (ACUTE ONLY): 1325 -SLP Stop Time (ACUTE ONLY): 1345 SLP Time Calculation (min) (ACUTE ONLY): 20 min Past Medical History: Past Medical History: Diagnosis Date  Addison disease (HCC)   Anoxic brain injury (HCC)   TBI  Anxiety   Aspiration pneumonia (HCC)   Atrial fibrillation (HCC)   Chronic constipation 11/23/2011  Dementia (HCC)   Dysphagia   Encephalopathy   Frontal lobe syndrome   Gait  abnormality   freq falls  Glaucoma   Glucocorticoid deficiency (HCC)   History of heart attack   History of recurrent UTIs   Hyperlipemia   Hypothyroidism   Mental disorder   Myocardial infarction (HCC)   Pneumoperitoneum of unknown etiology 12/21/2011  Quadriplegia (HCC)   Reflux   Seizure disorder (HCC)   Weakness of both legs  Past Surgical History: Past Surgical History: Procedure Laterality Date  DEBRIDEMENT OF ABDOMINAL WALL ABSCESS  01/26/2018  HERNIA MESH REMOVAL  01/26/2018  Infected mesh - removed w I&D abd wall abscess  IR GENERIC HISTORICAL  10/20/2016  IR REPLC GASTRO/COLONIC TUBE PERCUT W/FLUORO 10/20/2016 Oley Balm, MD [facility]-INTERV RAD  IR REPLACE G-TUBE SIMPLE WO FLUORO  01/28/2018  IR REPLC GASTRO/COLONIC TUBE PERCUT W/FLUORO  04/21/2017  IR REPLC GASTRO/COLONIC TUBE PERCUT W/FLUORO  07/22/2017  IR REPLC GASTRO/COLONIC TUBE PERCUT W/FLUORO  11/24/2017  IR REPLC GASTRO/COLONIC TUBE PERCUT W/FLUORO  08/12/2020  IRRIGATION AND DEBRIDEMENT ABSCESS N/A 01/26/2018  Procedure: IRRIGATION AND DEBRIDEMENT ABDOMINAL WALL ABSCESS WITH REMOVAL OF INFECTED MESH;  Surgeon: Karie Soda, MD;  Location: [facility];  Service: General;  Laterality: N/A;  NEPHRECTOMY  unknown  PEG TUBE PLACEMENT   HPI: 56 year old male with prior history of cardiac arrest with anoxic brain injury who presented with acute hypoxic respiratory failure in the setting of bilateral lower lobe pneumonia requiring  endotracheal intubation.  Pt was intubated from 10/25-10/28.  MBS in 2018 recommending Dysphagia 3 solids and honey-thick liquids.  Subjective: awake, alert, cooperative  Recommendations for follow up therapy are one component of a multi-disciplinary discharge planning process, led by the attending physician.  Recommendations may be updated based on patient status, additional functional criteria and insurance authorization. Assessment / Plan / Recommendation   10/28/2022   2:15 PM Clinical Impressions Clinical Impression Patient presenting  with a mild oral dysphagia and a mild-moderate pharyngeal phase dysphagia with impact from cognition leading to him being very impulsive with PO intake. Patient tested with the following barium consistencies, nectar thick, honey thick, puree and soft solid. He exhibited mildly delayed anterior to posterior transit of liquids and solids as well as decreased mastication with soft solids. During pharyngeal phase, patient exhibited swallow initation delay to level of vallecular sinus with all tested bolus consistencies. Only trace amount of vallecular residuals observed s/p initial swallow with liquids and solids. With nectar thick liquids, patient had two instances of silent aspiration which occured during the swallow, one time being trace amount and the next time a moderate amount. He did not exhibit a cough response until 1-2 minutes after the aspiration event.  These aspiration events occured when patient drinking nectar thick liquid through straw and when tilting his head back taking large cup sip. With small, controlled cup sips, he exhibited only trace penetration. With honey thick liquids, patient did not exhibit any instances of penetration or aspiration and he was able to draw honey thick liquid through straw. SLP tested him on small and large sips and no penetration or aspiration observed. Patient exhibited mastication delays with soft solids and as he is very impulsive with PO intake, SLP recommending to continue with honey thick liquids but to downgrade to Dys 1 (puree) solids. SLP will follow for PO toleration and ability to upgrade. SLP Visit Diagnosis Dysphagia, oropharyngeal phase (R13.12) Impact on safety and function Moderate aspiration risk;Risk for inadequate nutrition/hydration     10/28/2022   2:15 PM Treatment Recommendations Treatment Recommendations Therapy as outlined in treatment plan below     10/28/2022   2:23 PM Prognosis Prognosis for Safe Diet Advancement Fair Barriers to Reach Goals  Cognitive deficits;Behavior   10/28/2022   2:15 PM Diet Recommendations SLP Diet Recommendations Dysphagia 1 (Puree) solids;Honey thick liquids Liquid Administration via Cup;Straw Medication Administration Crushed with puree Compensations Slow rate;Small sips/bites Postural Changes Seated upright at 90 degrees     10/28/2022   2:15 PM Other Recommendations Oral Care Recommendations Oral care BID;Staff/trained caregiver to provide oral care Other Recommendations Clarify dietary restrictions;Order thickener from pharmacy;Prohibited food (jello, ice cream, thin soups);Remove water pitcher;Have oral suction available Follow Up Recommendations Skilled nursing-short term rehab (<3 hours/day) Assistance recommended at discharge Frequent or constant Supervision/Assistance Functional Status Assessment Patient has had a recent decline in their functional status and demonstrates the ability to make significant improvements in function in a reasonable and predictable amount of time.   10/28/2022   2:15 PM Frequency and Duration  Speech Therapy Frequency (ACUTE ONLY) min 2x/week Treatment Duration 2 weeks     10/28/2022   2:14 PM Oral Phase Oral Phase Impaired Oral - Honey Cup Reduced posterior propulsion Oral - Nectar Cup Reduced posterior propulsion Oral - Nectar Straw Reduced posterior propulsion Oral - Puree Reduced posterior propulsion Oral - Mech Soft Reduced posterior propulsion;Impaired mastication    10/28/2022   2:14 PM Pharyngeal Phase Pharyngeal Phase Impaired Pharyngeal- Honey Cup  Delayed swallow initiation-vallecula;Pharyngeal residue - valleculae Pharyngeal- Nectar Cup Delayed swallow initiation-vallecula;Reduced airway/laryngeal closure;Penetration/Aspiration during swallow;Pharyngeal residue - valleculae Pharyngeal Material enters airway, passes BELOW cords without attempt by patient to eject out (silent aspiration) Pharyngeal- Nectar Straw Penetration/Aspiration during swallow;Reduced airway/laryngeal  closure;Delayed swallow initiation-vallecula;Pharyngeal residue - valleculae Pharyngeal Material enters airway, passes BELOW cords without attempt by patient to eject out (silent aspiration) Pharyngeal- Puree Delayed swallow initiation-vallecula Pharyngeal- Mechanical Soft Delayed swallow initiation-vallecula    10/28/2022   2:15 PM Cervical Esophageal Phase  Cervical Esophageal Phase [facility] Sonia Baller, MA, CCC-SLP Speech Therapy                          Scheduled Meds:  Chlorhexidine Gluconate Cloth  6 each Topical Daily   clonazePAM  0.5 mg Per Tube BID   enoxaparin (LOVENOX) injection  40 mg Subcutaneous Q24H   feeding supplement (PROSource TF20)  60 mL Per Tube Daily   guaiFENesin  5 mL Per Tube Q4H   haloperidol  5 mg Per Tube QPM   levothyroxine  225 mcg Per Tube Q0600   midodrine  10 mg Per Tube Q8H   sertraline  75 mg Per Tube Daily   sodium chloride HYPERTONIC  4 mL Nebulization BID   valproic acid  500 mg Per Tube TID   Continuous Infusions:  ceFEPime (MAXIPIME) IV 2 g (10/30/22 1036)   dextrose 50 mL/hr at 10/30/22 0757   feeding supplement (OSMOLITE 1.5 CAL) Stopped (10/29/22 2042)   vancomycin 750 mg (10/30/22 0022)     LOS: 9 days        Phillips Climes, MD [facility]   To contact the attending provider between 7A-7P or the covering provider during after hours 7P-7A, please log into the web site [email or web address] and access using universal [place] password for that web site. If you do not have the password, please call the hospital operator.  10/30/2022, 11:17 AM

## 2022-10-30 NOTE — Progress Notes (Signed)
Attempted PICC x 1 on LUA, unable to pass through axillary. No visible L basilic vein and cephalic vein very small and non compressible. Patient is swollen on bilateral arms. As per recommendation IR to place PICC or central line. RN aware.

## 2022-10-30 NOTE — Progress Notes (Signed)
Dr. Waldron Labs aware of pt's low blood pressure and low CBG readings. Morning BMP glucose is WNL. Dr. Waldron Labs made aware and will go by the lab draw reading

## 2022-10-31 DIAGNOSIS — J9601 Acute respiratory failure with hypoxia: Secondary | ICD-10-CM | POA: Diagnosis not present

## 2022-10-31 DIAGNOSIS — Z789 Other specified health status: Secondary | ICD-10-CM | POA: Diagnosis not present

## 2022-10-31 DIAGNOSIS — E440 Moderate protein-calorie malnutrition: Secondary | ICD-10-CM | POA: Diagnosis not present

## 2022-10-31 DIAGNOSIS — G931 Anoxic brain damage, not elsewhere classified: Secondary | ICD-10-CM | POA: Diagnosis not present

## 2022-10-31 LAB — CBC
HCT: 30.1 % — ABNORMAL LOW (ref 39.0–52.0)
Hemoglobin: 9.4 g/dL — ABNORMAL LOW (ref 13.0–17.0)
MCH: 28.4 pg (ref 26.0–34.0)
MCHC: 31.2 g/dL (ref 30.0–36.0)
MCV: 90.9 fL (ref 80.0–100.0)
Platelets: 223 10*3/uL (ref 150–400)
RBC: 3.31 MIL/uL — ABNORMAL LOW (ref 4.22–5.81)
RDW: 14.3 % (ref 11.5–15.5)
WBC: 5.2 10*3/uL (ref 4.0–10.5)
nRBC: 0 % (ref 0.0–0.2)

## 2022-10-31 LAB — BASIC METABOLIC PANEL
Anion gap: 4 — ABNORMAL LOW (ref 5–15)
BUN: 11 mg/dL (ref 6–20)
CO2: 29 mmol/L (ref 22–32)
Calcium: 8 mg/dL — ABNORMAL LOW (ref 8.9–10.3)
Chloride: 102 mmol/L (ref 98–111)
Creatinine, Ser: 0.74 mg/dL (ref 0.61–1.24)
GFR, Estimated: >60 mL/min (ref >60)
Glucose, Bld: 135 mg/dL — ABNORMAL HIGH (ref 70–99)
Potassium: 4 mmol/L (ref 3.5–5.1)
Sodium: 135 mmol/L (ref 135–145)

## 2022-10-31 LAB — CORTISOL: Cortisol, Plasma: 10.8 ug/dL

## 2022-10-31 LAB — GLUCOSE, CAPILLARY
Glucose-Capillary: 124 mg/dL — ABNORMAL HIGH (ref 70–99)
Glucose-Capillary: 65 mg/dL — ABNORMAL LOW (ref 70–99)
Glucose-Capillary: 68 mg/dL — ABNORMAL LOW (ref 70–99)
Glucose-Capillary: 70 mg/dL (ref 70–99)
Glucose-Capillary: 73 mg/dL (ref 70–99)
Glucose-Capillary: 82 mg/dL (ref 70–99)
Glucose-Capillary: 85 mg/dL (ref 70–99)
Glucose-Capillary: 90 mg/dL (ref 70–99)
Glucose-Capillary: 97 mg/dL (ref 70–99)

## 2022-10-31 LAB — SODIUM, URINE, RANDOM: Sodium, Ur: 94 mmol/L

## 2022-10-31 LAB — LACTIC ACID, PLASMA
Lactic Acid, Venous: 0.8 mmol/L (ref 0.5–1.9)
Lactic Acid, Venous: 0.6 mmol/L (ref 0.5–1.9)

## 2022-10-31 LAB — T4, FREE: Free T4: 1.19 ng/dL — ABNORMAL HIGH (ref 0.61–1.12)

## 2022-10-31 LAB — TSH: TSH: 21.532 u[IU]/mL — ABNORMAL HIGH (ref 0.350–4.500)

## 2022-10-31 MED ORDER — COSYNTROPIN 0.25 MG IJ SOLR
0.2500 mg | Freq: Once | INTRAMUSCULAR | Status: AC
  Administered 2022-11-01: 0.25 mg via INTRAVENOUS
  Filled 2022-10-31: qty 0.25

## 2022-10-31 MED ORDER — DEXTROSE 50 % IV SOLN: 12.5000 g | INTRAVENOUS | Status: AC

## 2022-10-31 MED ORDER — SODIUM CHLORIDE 0.9 % IV BOLUS
1000.0000 mL | Freq: Once | INTRAVENOUS | Status: AC
  Administered 2022-10-31: 1000 mL via INTRAVENOUS

## 2022-10-31 MED ORDER — DEXTROSE 50 % IV SOLN
INTRAVENOUS | Status: AC
  Filled 2022-10-31: qty 50

## 2022-10-31 MED ORDER — DEXTROSE 50 % IV SOLN
12.5000 g | INTRAVENOUS | Status: AC
  Administered 2022-10-31: 12.5 g via INTRAVENOUS

## 2022-10-31 MED ORDER — SODIUM CHLORIDE 0.9 % IV SOLN: INTRAVENOUS | Status: AC

## 2022-10-31 MED ORDER — DEXTROSE 50 % IV SOLN
INTRAVENOUS | Status: AC
  Administered 2022-10-31: 12.5 g via INTRAVENOUS
  Filled 2022-10-31: qty 50

## 2022-10-31 NOTE — Procedures (Signed)
Arterial Catheter Insertion Procedure Note  [name]  [id number]  1966/10/21  Date:10/31/22  Time:4:47 PM    Provider Performing: Ronaldo Miyamoto    Procedure: Insertion of Arterial Line [phone]) without US guidance  Indication(s) Blood pressure monitoring and/or need for frequent ABGs  Consent Risks of the procedure as well as the alternatives and risks of each were explained to the patient and/or caregiver.  Consent for the procedure was obtained and is signed in the bedside chart  Anesthesia None   Time Out Verified patient identification, verified procedure, site/side was marked, verified correct patient position, special equipment/implants available, medications/allergies/relevant history reviewed, required imaging and test results available.   Sterile Technique Maximal sterile technique including full sterile barrier drape, hand hygiene, sterile gown, sterile gloves, mask, hair covering, sterile ultrasound probe cover (if used).   Procedure Description Area of catheter insertion was cleaned with chlorhexidine and draped in sterile fashion. Without real-time ultrasound guidance an arterial catheter was placed into the left radial artery.  Appropriate arterial tracings confirmed on monitor.     Complications/Tolerance None; patient tolerated the procedure well.   EBL Minimal   Specimen(s) None

## 2022-10-31 NOTE — Progress Notes (Addendum)
PROGRESS NOTE    [name]  TKZ:[id number] DOB: Aug 09, 1966 DOA: 10/21/2022 PCP: No primary care provider on file.    Chief Complaint  Patient presents with   Respiratory Arrest    Brief Narrative:   56 year old man admitted from [facility] with significant for Anoxic Brain injury secondary to remote cardiac arrest, PEG dependent(removed), CAD, hypothyroidism, seizure disorder, paroxysmal A. fib not on systemic anticoagulation and adrenal insufficiency  -Patient presents from SNF hypoxic in the 70s, agitated, likely with aspiration event, required intubation and admission to ICU. ETT 10/25-10/28. Found to have large abscess on the R abdominal wall s/p bedside I&D by surgery ,  Patient improved, successfully extubated and transferred to [facility] 10/29/2022.    Assessment & Plan:   Principal Problem:   Respiratory failure (HCC) Active Problems:   Anoxic brain injury (HCC)   Protein calorie malnutrition (HCC)   Cardiomyopathy, ischemic   Quadriplegia (HCC)   Acute hypoxemic respiratory failure (HCC)   Pneumonia of right lower lobe due to infectious organism   Malnutrition of moderate degree   Pressure injury of skin   Difficult intravenous access   Septic shock 2/2 pneumonia HCAP Intra-abdominal abscess -Septic shock present on admission, requiring pressor support -Pathophysiology of sepsis has resolved, off pressor, but blood pressure remains soft, remains on midodrine. -Continue vancomycin, and apparently had aspiration event 11/3, for which she is back on cefepime -Continue midodrine 10 mg q8h -Pressure remains soft, will add scheduled albumin -CVP is 9 this morning, will give IV fluids   Acute respiratory failure with hypoxia (HCC) Respiratory acidosis, resolved -Currently extubated, requiring nasal cannula. - Was encouraged to use incentive spirometry and flutter valve. -hypertonic 3% nebulized solution -Start guaifenesin per tube to help loosen  secretions -Continue O2 supplementation, wean for O2 sat >92% -Discontinue scopolamine patch   Hypoglycemia -Continue with D10 LR, now he is back on tube feed will he can decrease his D10 LR, aspiration event yesterday, will keep n.p.o. for now, continue with tube feed.   R abdominal wall abscess Hx of abdominal hernia CT A/P showed enlarging 10.7 x 8.6 x 7.5 cm abnormal fluid collection concerning for possible abscess.  Successful bedside I&D by CCS on 10/30.  Mild tenderness around the incision site but no complications -CCS following, appreciate recs -Continue vancomycin, de-escalate based on wound culture and sensitivities -Dressing changes per CCS   Parox A-fib, resolved Sinus brady Remains in NSR but has had intermittent bradycardia  CHADVAs score of 2. No electrolyte abnormalities. TSH within normal limits. Patient is at a significant risk of falls at baseline.  Patient's risk of bleeding outweighs his risk of stroke so we will hold off on long-term anticoagulation.  HR has been stable in the 60s. -Telemetry -Lovenox for DVT prophylaxis -Trend and replete electrolytes   Cardiomyopathy, ischemic Last TTE 09/2017 showed EF 60 to 65% and akinesis of the basal inferior myocardium. Repeat TTE showed EF 50 to 55%, with global hypokinesis but no valvular abnormalities. -Monitor and replete electrolytes -Strict I&O's   Hypothyroidism Repeat stable at TSH 5.5. -Continue Synthroid to 225 mcg daily   Protein calorie malnutrition (HCC) -Cortrak placed on 10/30.  -Continue TF for now -N.p.o. due to aspiration event overnight.   Hx of anoxic brain injury, TBI Seizure disorder -Continue home Haldol per tube -Continue valproic acid per tube -Continue BID Klonopin and PRN ativan for agitation  Difficult IV access -IV team unable to place a PICC line,discussed  with IR team as  well, they defer to PCCM, femoral TLC placed by [facility] 11/3  Body mass index is 21.86 kg/m.  DVT  prophylaxis: Lovenox Code Status: (Partial) Family Communication: None at bedside, tried to call  legal guardian with no availability.  Status is: Inpatient    Consultants:  PCCM General surgery  Consult line placed 10/30/2022, as IV team were unable to place PICC line.  Subjective:  Patient had central line placed yesterday, he had coughing/aspiration the night before.  Objective: Vitals:   10/31/22 0800 10/31/22 0900 10/31/22 1000 10/31/22 1037  BP: 93/70 (!) 85/56 (!) 86/53 (!) 87/59  Pulse: (!) 59 60 (!) 58 69  Resp: 18 (!) 21 17 19   Temp:      TempSrc:      SpO2: 97% 96% 98% 96%  Weight:      Height:        Intake/Output Summary (Last 24 hours) at 10/31/2022 1047 Last data filed at 10/31/2022 0800 Gross per 24 hour  Intake 3145.1 ml  Output 3875 ml  Net -729.9 ml   Filed Weights   10/27/22 0500 10/29/22 0500 10/31/22 0406  Weight: 61.7 kg 62.8 kg 65.2 kg    Examination:   Awake Alert, conversant and appropriate today, chronically ill-appearing, extremely frail, appears  older than stated age  symmetrical Chest wall movement, lung entry at the bases with Rales RRR,No Gallops,Rubs or new Murmurs, No Parasternal Heave +ve B.Sounds, Abd Soft, abdomen bandaged No Cyanosis, Clubbing , trace anasarca  Right femoral central line  Data Reviewed: I have personally reviewed following labs and imaging studies  CBC: Recent Labs  Lab 10/26/22 0628 10/28/22 0223 10/29/22 0259 10/30/22 0828 10/31/22 0405  WBC 10.5 6.4 7.1 7.4 5.2  HGB 11.6* 10.3* 10.7* 9.4* 9.4*  HCT 35.6* 30.9* 33.0* 28.8* 30.1*  MCV 86.4 87.5 88.7 88.3 90.9  PLT 231 198 226 PLATELET CLUMPS NOTED ON SMEAR, COUNT APPEARS ADEQUATE 332    Basic Metabolic Panel: Recent Labs  Lab 10/27/22 1037 10/28/22 0223 10/29/22 0259 10/30/22 0828 10/30/22 1254 10/31/22 0405  NA 138 137 136 135  --  135  K 4.1 4.2 4.7 4.6  --  4.0  CL 105 104 102 100  --  102  CO2 24 25 27 28   --  29  GLUCOSE 98 72  87 106* 69* 135*  BUN 12 12 12 11   --  11  CREATININE 0.78 0.70 0.70 0.70  --  0.74  CALCIUM 7.8* 7.8* 8.0* 8.0*  --  8.0*    GFR: Estimated Creatinine Clearance: 96.2 mL/min (by C-G formula based on SCr of 0.74 mg/dL).  Liver Function Tests: Recent Labs  Lab 10/28/22 0223  AST 42*  ALT 16  ALKPHOS 50  BILITOT 0.5  PROT 5.0*  ALBUMIN 1.6*    CBG: Recent Labs  Lab 10/30/22 2316 10/31/22 0010 10/31/22 0343 10/31/22 0410 10/31/22 0730  GLUCAP 70 82 65* 124* 73     Recent Results (from the past 240 hour(s))  Blood Culture (routine x 2)     Status: None   Collection Time: 10/21/22  6:33 PM   Specimen: BLOOD RIGHT HAND  Result Value Ref Range Status   Specimen Description BLOOD RIGHT HAND  Final   Special Requests   Final    BOTTLES DRAWN AEROBIC AND ANAEROBIC Blood Culture adequate volume   Culture   Final    NO GROWTH 5 DAYS Performed at [facility], [place] [address], [place],  [address]  Report Status 10/26/2022 FINAL  Final  Blood Culture (routine x 2)     Status: None   Collection Time: 10/21/22  7:04 PM   Specimen: BLOOD RIGHT FOREARM  Result Value Ref Range Status   Specimen Description BLOOD RIGHT FOREARM  Final   Special Requests   Final    BOTTLES DRAWN AEROBIC AND ANAEROBIC Blood Culture adequate volume   Culture   Final    NO GROWTH 5 DAYS Performed at [facility], [address] [place], [place] [address]    Report Status 10/26/2022 FINAL  Final  Resp Panel by RT-PCR (Flu A&B, Covid) Anterior Nasal Swab     Status: None   Collection Time: 10/21/22  7:07 PM   Specimen: Anterior Nasal Swab  Result Value Ref Range Status   SARS Coronavirus 2 by RT PCR NEGATIVE NEGATIVE Final    Comment: (NOTE) SARS-CoV-2 target nucleic acids are NOT DETECTED.  The SARS-CoV-2 RNA is generally detectable in upper respiratory specimens during the acute phase of infection. The lowest concentration of SARS-CoV-2 viral copies this assay can  detect is 138 copies/mL. A negative result does not preclude SARS-Cov-2 infection and should not be used as the sole basis for treatment or other patient management decisions. A negative result may occur with  improper specimen collection/handling, submission of specimen other than nasopharyngeal swab, presence of viral mutation(s) within the areas targeted by this assay, and inadequate number of viral copies(<138 copies/mL). A negative result must be combined with clinical observations, patient history, and epidemiological information. The expected result is Negative.  Fact Sheet for Patients:  BloggerCourse.com  Fact Sheet for Healthcare Providers:  SeriousBroker.it  This test is no t yet approved or cleared by the [place] FDA and  has been authorized for detection and/or diagnosis of SARS-CoV-2 by FDA under an Emergency Use Authorization (EUA). This EUA will remain  in effect (meaning this test can be used) for the duration of the COVID-19 declaration under Section 564(b)(1) of the Act, 21 U.S.C.section 360bbb-3(b)(1), unless the authorization is terminated  or revoked sooner.       Influenza A by PCR NEGATIVE NEGATIVE Final   Influenza B by PCR NEGATIVE NEGATIVE Final    Comment: (NOTE) The Xpert Xpress SARS-CoV-2/FLU/RSV plus assay is intended as an aid in the diagnosis of influenza from Nasopharyngeal swab specimens and should not be used as a sole basis for treatment. Nasal washings and aspirates are unacceptable for Xpert Xpress SARS-CoV-2/FLU/RSV testing.  Fact Sheet for Patients: BloggerCourse.com  Fact Sheet for Healthcare Providers: SeriousBroker.it  This test is not yet approved or cleared by the [place] FDA and has been authorized for detection and/or diagnosis of SARS-CoV-2 by FDA under an Emergency Use Authorization (EUA). This EUA will remain in  effect (meaning this test can be used) for the duration of the COVID-19 declaration under Section 564(b)(1) of the Act, 21 U.S.C. section 360bbb-3(b)(1), unless the authorization is terminated or revoked.  Performed at [facility], [address], [place], [place] [address]   Urine Culture     Status: None   Collection Time: 10/22/22 12:39 AM   Specimen: In/Out Cath Urine  Result Value Ref Range Status   Specimen Description IN/OUT CATH URINE  Final   Special Requests NONE  Final   Culture   Final    NO GROWTH Performed at [facility], [address] [place], [place] [address]    Report Status 10/23/2022 FINAL  Final  MRSA Next Gen by PCR, Nasal     Status: None   Collection Time: 10/22/22 12:45 AM   Specimen: Nasal Mucosa; Nasal Swab  Result Value Ref Range Status   MRSA by PCR Next Gen NOT DETECTED NOT DETECTED Final    Comment: (NOTE) The GeneXpert MRSA Assay (FDA approved for NASAL specimens only), is one component of a comprehensive MRSA colonization surveillance program. It is not intended to diagnose MRSA infection nor to guide or monitor treatment for MRSA infections. Test performance is not FDA approved in patients less than 70 years old. Performed at [facility], [address] [place], [place] [address]   Culture, Respiratory w Gram Stain     Status: None   Collection Time: 10/22/22 12:52 AM   Specimen: Tracheal Aspirate; Respiratory  Result Value Ref Range Status   Specimen Description TRACHEAL ASPIRATE  Final   Special Requests NONE  Final   Gram Stain   Final    FEW WBC PRESENT,BOTH PMN AND MONONUCLEAR ABUNDANT GRAM POSITIVE RODS RARE YEAST WITH PSEUDOHYPHAE    Culture   Final    MODERATE Normal respiratory flora-no Staph aureus or Pseudomonas seen Performed at [facility], [address] [place], [place] [address]    Report Status 10/24/2022 FINAL  Final  Aerobic Culture w Gram Stain (superficial specimen)     Status: None    Collection Time: 10/24/22  6:00 AM   Specimen: Wound  Result Value Ref Range Status   Specimen Description WOUND  Final   Special Requests ABDOMEN  Final   Gram Stain   Final    FEW WBC PRESENT,BOTH PMN AND MONONUCLEAR RARE GRAM POSITIVE COCCI IN SINGLES Performed at [facility], [address], [place], [place] [address]    Culture FEW METHICILLIN RESISTANT STAPHYLOCOCCUS AUREUS  Final   Report Status 10/26/2022 FINAL  Final   Organism ID, Bacteria METHICILLIN RESISTANT STAPHYLOCOCCUS AUREUS  Final      Susceptibility   Methicillin resistant staphylococcus aureus - MIC*    CIPROFLOXACIN >=8 RESISTANT Resistant     ERYTHROMYCIN >=8 RESISTANT Resistant     GENTAMICIN <=0.5 SENSITIVE Sensitive     OXACILLIN >=4 RESISTANT Resistant     TETRACYCLINE <=1 SENSITIVE Sensitive     VANCOMYCIN 1 SENSITIVE Sensitive     TRIMETH/SULFA <=10 SENSITIVE Sensitive     CLINDAMYCIN >=8 RESISTANT Resistant     RIFAMPIN <=0.5 SENSITIVE Sensitive     Inducible Clindamycin NEGATIVE Sensitive     * FEW METHICILLIN RESISTANT STAPHYLOCOCCUS AUREUS  Aerobic Culture w Gram Stain (superficial specimen)     Status: None   Collection Time: 10/26/22  1:54 PM   Specimen: Wound  Result Value Ref Range Status   Specimen Description WOUND ABDOMEN  Final   Special Requests NONE  Final   Gram Stain   Final    RARE WBC PRESENT,BOTH PMN AND MONONUCLEAR RARE GRAM POSITIVE COCCI IN CLUSTERS    Culture   Final    RARE STAPHYLOCOCCUS AUREUS SUSCEPTIBILITIES PERFORMED ON PREVIOUS CULTURE WITHIN THE LAST 5 DAYS. Performed at [facility], [address], [place], [place] [address]    Report Status 10/29/2022 FINAL  Final         Radiology Studies: DG CHEST PORT 1 VIEW  Result Date: 10/30/2022 CLINICAL DATA:  Aspiration EXAM: PORTABLE CHEST 1 VIEW COMPARISON:  10/28/2022 chest radiograph. FINDINGS: Enteric tube enters stomach with the tip noted in the right  upper quadrant overlying the region of  the pylorus. Stable cardiomediastinal silhouette with normal heart size. No pneumothorax. Small bilateral pleural effusions, right greater than left, similar. Hazy patchy bibasilar lung opacities are similar. No overt pulmonary edema. IMPRESSION: 1. Similar small bilateral pleural effusions, right greater than left. 2. Similar hazy patchy bibasilar lung opacities. Electronically Signed   By: Delbert PhenixJason A Poff M.D.   On: 10/30/2022 08:52   DG Abd Portable 1V  Result Date: 10/30/2022 CLINICAL DATA:  56 year old male presenting for evaluation of abdominal pain. EXAM: PORTABLE ABDOMEN - 1 VIEW COMPARISON:  October 26, 2022. FINDINGS: Visualized lung bases are clear elevated RIGHT hemidiaphragm. No change in the position of the feeding tube which is either in the distal gastric antrum or pylorus. Barium contrast material administered during swallow study has passed into the colon. Barium transit reaches the rectum. No signs small bowel obstruction. Postoperative changes in the RIGHT upper quadrant. On limited assessment no acute skeletal process. IMPRESSION: 1. No change in the position of the feeding tube which is either in the distal gastric antrum or pylorus. 2. Barium contrast administered during swallow study has passed into the colon. Barium transit reaches the rectum. Negative for acute findings. Electronically Signed   By: Donzetta KohutGeoffrey  Wile M.D.   On: 10/30/2022 08:29   US EKG SITE RITE  Result Date: 10/30/2022 If Site Rite image not attached, placement could not be confirmed due to current cardiac rhythm.       Scheduled Meds:  Chlorhexidine Gluconate Cloth  6 each Topical Daily   clonazePAM  0.5 mg Per Tube BID   enoxaparin (LOVENOX) injection  40 mg Subcutaneous Q24H   feeding supplement (PROSource TF20)  60 mL Per Tube Daily   guaiFENesin  5 mL Per Tube Q4H   haloperidol  5 mg Per Tube QPM   levothyroxine  225 mcg Per Tube Q0600   midodrine  10 mg Per Tube Q8H   sertraline  75 mg Per Tube Daily    sodium chloride flush  10-40 mL Intracatheter Q12H   valproic acid  500 mg Per Tube TID   Continuous Infusions:  albumin human Stopped (10/31/22 45400642)   ceFEPime (MAXIPIME) IV 2 g (10/31/22 1043)   dextrose 50 mL/hr at 10/31/22 0800   feeding supplement (OSMOLITE 1.5 CAL) 50 mL/hr at 10/31/22 0800   vancomycin Stopped (10/31/22 0024)     LOS: 10 days        Huey Bienenstockawood Rayla Pember, MD [facility]   To contact the attending provider between 7A-7P or the covering provider during after hours 7P-7A, please log into the web site [email or web address] and access using universal [place] password for that web site. If you do not have the password, please call the hospital operator.  10/31/2022, 10:47 AM

## 2022-10-31 NOTE — Progress Notes (Addendum)
NAME:  Shawna Kiener, MRN:  [id number], DOB:  1966-03-29, LOS: 10 ADMISSION DATE:  10/21/2022, CONSULTATION DATE:  10/25 REFERRING MD:  Theresia Lo, EDP CHIEF COMPLAINT:  Hypoxia   History of Present Illness:  56 year old man admitted from [facility] for hypoxia and unresponsiveness. Limited information available. May have aspirated. Hypoxic to 70's and agitated in ED so intubated.    Chronic SNF resident due to demential and anoxic brain injury, gait difficulties. May be bed bound.    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  He eventually recovered but had subsequent anoxic brain injury.  He has been institution bound since approximately 2007.  His sister states that he has had a significant functional decline in the past 2 years since their mother passed away.  Pertinent  Medical History   Past Medical History:  Diagnosis Date   Addison disease (HCC)    Anoxic brain injury (HCC)    TBI   Anxiety    Aspiration pneumonia (HCC)    Atrial fibrillation (HCC)    Chronic constipation 11/23/2011   Dementia (HCC)    Dysphagia    Encephalopathy    Frontal lobe syndrome    Gait abnormality    freq falls   Glaucoma    Glucocorticoid deficiency (HCC)    History of heart attack    History of recurrent UTIs    Hyperlipemia    Hypothyroidism    Mental disorder    Myocardial infarction (HCC)    Pneumoperitoneum of unknown etiology 12/21/2011   Quadriplegia (HCC)    Reflux    Seizure disorder (HCC)    Weakness of both legs     Significant Hospital Events: Including procedures, antibiotic start and stop dates in addition to other pertinent events   10/25 - admitted via ED. 10/27: A-fib overnight,  started amiodarone drip 10/28: Extubated to [place] 10/28: Abd U/S 6.7 x 6.8 cm hernia versus hematoma 10/28: CT A/P Enlarging 10.7 x 8.6 x 7.5 cm fluid collection  Interim History / Subjective:  Called back for hypotension   Objective   Blood pressure (Abnormal) 87/59, pulse 64, temperature (Abnormal) 97.5 F (36.4 C), temperature source Axillary, resp. rate 16, height 5\' 8"  (1.727 m), weight 65.2 kg, SpO2 91 %.        Intake/Output Summary (Last 24 hours) at 10/31/2022 1619 Last data filed at 10/31/2022 1400 Gross per 24 hour  Intake 3592.68 ml  Output 3475 ml  Net 117.68 ml   Filed Weights   10/27/22 0500 10/29/22 0500 10/31/22 0406  Weight: 61.7 kg 62.8 kg 65.2 kg   Examination: General resting in bed. No distress HENT NCAT no JVD Pulm dec bases. No accessory use  PCXR w/ R>L airspace disease, prob element of effusion  Card rrr brady Abd soft  Ext anasarca Gu cl yellow  Neuro min responsive  Resolved Hospital Problem list   AKI Respiratory acidosis Afib Septic shock  Assessment & Plan:  Hypotension.  Etiology not clear. Has h/o AI, I do not think this is sepsis and appears at least euvolemic Nml cort (was baseline), neg lactate and nml renal fxn reassuring Plan Cont scheduled albumin Place a line to ensure accurate BP  Ck Una  Cont midodrine (I would not worry about the bradycardia from this), cont at current dose  Repeat TSH  Cosyntropin stim test  Hcap w/ associated acute hypoxic respiratory failure  Plan Cont supplemental oxygen Day 6 abx, currently on vanc and cefepime  Cycle PCTs Am CXR Consider bedside US r/o sig effusion    R abdominal wall abscess Hx of abdominal hernia CT A/P showed enlarging 10.7 x 8.6 x 7.5 cm abnormal fluid collection concerning for possible abscess.  Surgery consulted and plan to to do bedside I&D today. -CTS following, appreciate recs Plan Cont abx Cont wound care  Atrial fib, now Sinus brady  CHADVAs score of 2.   Plan Cont LMWH (prophylaxis dosing)  Decide on long term ac Tele  Holding rate control    Cardiomyopathy, ischemic Last TTE 09/2017 showed EF 60 to 65% and akinesis of the basal inferior myocardium. Repeat TTE showed EF 50 to 55%, with global hypokinesis but no valvular abnormalities. Plan Holding GDMT Tele   Hypothyroidism Repeat stable at TSH 5.5. Plan Cont synthroid   Hx adrenal insufficiency Normal a.m. cortisol. -Fludrocortisone 0.1 mg daily to help with BP  Protein calorie malnutrition (HCC) Plan Cont TFs  Hx of anoxic brain injury, TBI Seizure disorder Plan Cont haldol, valproic and klonopin   Best Practice (right click and "Reselect all SmartList Selections" daily)   Diet/type: NPO w/ oral meds DVT prophylaxis: SCD, lovenox GI prophylaxis: PPI Lines: N/A Foley:  N/A Code Status:  limited Last date of multidisciplinary goals of care discussion  [Pending, Surgery spoke with sister today for consent of bedside I&D, ]  Critical care time: 32 min    Erick Colace ACNP-BC [place] Pager # [phone] OR # [phone] if no answer

## 2022-10-31 NOTE — Progress Notes (Signed)
Dr. [facility] made aware of pt's soft Bps. NS bolus started.

## 2022-11-01 DIAGNOSIS — I959 Hypotension, unspecified: Secondary | ICD-10-CM

## 2022-11-01 DIAGNOSIS — Z789 Other specified health status: Secondary | ICD-10-CM | POA: Diagnosis not present

## 2022-11-01 DIAGNOSIS — J9601 Acute respiratory failure with hypoxia: Secondary | ICD-10-CM | POA: Diagnosis not present

## 2022-11-01 DIAGNOSIS — J189 Pneumonia, unspecified organism: Secondary | ICD-10-CM | POA: Diagnosis not present

## 2022-11-01 LAB — BASIC METABOLIC PANEL
Anion gap: 5 (ref 5–15)
BUN: 11 mg/dL (ref 6–20)
CO2: 28 mmol/L (ref 22–32)
Calcium: 8.5 mg/dL — ABNORMAL LOW (ref 8.9–10.3)
Chloride: 105 mmol/L (ref 98–111)
Creatinine, Ser: 0.75 mg/dL (ref 0.61–1.24)
GFR, Estimated: >60 mL/min (ref >60)
Glucose, Bld: 67 mg/dL — ABNORMAL LOW (ref 70–99)
Potassium: 4.2 mmol/L (ref 3.5–5.1)
Sodium: 138 mmol/L (ref 135–145)

## 2022-11-01 LAB — CBC
HCT: 28.7 % — ABNORMAL LOW (ref 39.0–52.0)
Hemoglobin: 9.2 g/dL — ABNORMAL LOW (ref 13.0–17.0)
MCH: 28.8 pg (ref 26.0–34.0)
MCHC: 32.1 g/dL (ref 30.0–36.0)
MCV: 90 fL (ref 80.0–100.0)
Platelets: 216 10*3/uL (ref 150–400)
RBC: 3.19 MIL/uL — ABNORMAL LOW (ref 4.22–5.81)
RDW: 14.8 % (ref 11.5–15.5)
WBC: 5 10*3/uL (ref 4.0–10.5)
nRBC: 0 % (ref 0.0–0.2)

## 2022-11-01 LAB — GLUCOSE, CAPILLARY
Glucose-Capillary: 104 mg/dL — ABNORMAL HIGH (ref 70–99)
Glucose-Capillary: 105 mg/dL — ABNORMAL HIGH (ref 70–99)
Glucose-Capillary: 61 mg/dL — ABNORMAL LOW (ref 70–99)
Glucose-Capillary: 68 mg/dL — ABNORMAL LOW (ref 70–99)
Glucose-Capillary: 72 mg/dL (ref 70–99)
Glucose-Capillary: 74 mg/dL (ref 70–99)
Glucose-Capillary: 77 mg/dL (ref 70–99)
Glucose-Capillary: 86 mg/dL (ref 70–99)

## 2022-11-01 LAB — ACTH STIMULATION, 3 TIME POINTS
Cortisol, 30 Min: 18.1 ug/dL
Cortisol, 60 Min: 20.8 ug/dL
Cortisol, Base: 6.5 ug/dL

## 2022-11-01 MED ORDER — KATE FARMS STANDARD 1.4 PO LIQD
325.0000 mL | ORAL | Status: DC
  Administered 2022-11-01 – 2022-11-06 (×5): 325 mL
  Filled 2022-11-01 (×25): qty 325

## 2022-11-01 MED ORDER — LACTATED RINGERS IV SOLN: INTRAVENOUS | Status: DC

## 2022-11-01 MED ORDER — DEXTROSE 50 % IV SOLN
INTRAVENOUS | Status: AC
  Administered 2022-11-01: 12.5 g via INTRAVENOUS
  Filled 2022-11-01: qty 50

## 2022-11-01 MED ORDER — DEXTROSE 50 % IV SOLN: 12.5000 g | INTRAVENOUS | Status: AC

## 2022-11-01 MED ORDER — DEXTROSE 50 % IV SOLN
INTRAVENOUS | Status: AC
  Administered 2022-11-01: 25 mL
  Administered 2022-11-02: 25 mL via INTRAVENOUS
  Filled 2022-11-01: qty 50

## 2022-11-01 MED ORDER — PANTOPRAZOLE SODIUM 40 MG IV SOLR
40.0000 mg | Freq: Two times a day (BID) | INTRAVENOUS | Status: DC
  Administered 2022-11-01 – 2022-11-07 (×13): 40 mg via INTRAVENOUS
  Filled 2022-11-01 (×13): qty 10

## 2022-11-01 NOTE — Progress Notes (Signed)
Per MD, pt will not be ready for dc to SNF Monday. [facility] transport has been cancelled. Jarrett Soho at [facility] [phone]) updated. SW will continue to follow for dc to SNF pending medical clearance.   Wandra Feinstein, MSW, LCSW [phone] (coverage)

## 2022-11-01 NOTE — Progress Notes (Signed)
NUTRITION NOTE RD working remotely.  Page received from RN about patient experiencing abdominal cramping and pain each evening leading to tube feeding needing to be turned off. It has been discovered since most recent RD assessment, on 11/2, that patient has a soy allergy.   RD communicated plan to switch patient from Osmolite 1.5, which contains soy, to [facility] 1.4, which is soy free.  Will change TF order to [facility] 1.4 @ 55 ml/hr with 60 ml Prosource TF20 once/day. This regimen will provide 1928 kcal, 101 grams protein, and 950 ml free water.  RD will continue to follow per protocol.    Steven Matin, MS, RD, LDN, CNSC Clinical Dietitian PRN/Relief staff On-call/weekend pager # available in [facility]

## 2022-11-01 NOTE — Progress Notes (Signed)
PROGRESS NOTE    [name]  DJT:[id number] DOB: [birth year] DOA: 10/21/2022 PCP: No primary care provider on file.    Chief Complaint  Patient presents with   Respiratory Arrest    Brief Narrative:   56 year old man admitted from [facility] with significant for Anoxic Brain injury secondary to remote cardiac arrest, PEG dependent(removed), CAD, hypothyroidism, seizure disorder, paroxysmal A. fib not on systemic anticoagulation and adrenal insufficiency  -Patient presents from SNF hypoxic in the 70s, agitated, likely with aspiration event, required intubation and admission to ICU. ETT 10/25-10/28. Found to have large abscess on the R abdominal wall s/p bedside I&D by surgery ,  Patient improved, successfully extubated and transferred to [facility] 10/29/2022.    Assessment & Plan:   Principal Problem:   Respiratory failure (HCC) Active Problems:   Anoxic brain injury (HCC)   Protein calorie malnutrition (HCC)   Cardiomyopathy, ischemic   Quadriplegia (HCC)   Acute hypoxemic respiratory failure (HCC)   Pneumonia of right lower lobe due to infectious organism   Malnutrition of moderate degree   Pressure injury of skin   Difficult intravenous access   Septic shock 2/2 pneumonia HCAP Intra-abdominal abscess -Septic shock present on admission, requiring pressor support -Pathophysiology of sepsis has resolved, off pressor, but blood pressure remains soft, remains on midodrine. -Continue vancomycin, and apparently had aspiration event 11/3, for which she is back on cefepime -Continue midodrine 10 mg q8h -Pressure has improved with scheduled albumin.  Hypotension  -Patient persistently hypotensive, PCCM  consulted, he does not appear to be an active sepsis currently, A line was placed, and there is a big discrepancy between actual blood pressure reading via A-line  VSperipheral blood pressure check at least of 20 mmHg, so should be able to tolerate systolic blood pressure  upper 70s or 80s as it does actually reflect appropriate blood pressure in the 90s or 100.   Acute respiratory failure with hypoxia (HCC) Respiratory acidosis, resolved -Currently extubated, requiring nasal cannula. - Was encouraged to use incentive spirometry and flutter valve. -hypertonic 3% nebulized solution -Start guaifenesin per tube to help loosen secretions -Continue O2 supplementation, wean for O2 sat >92% -Discontinue scopolamine patch   Hypoglycemia -Remains on D10 LR, hopefully can be decreased once tolerating tube feed.   R abdominal wall abscess Hx of abdominal hernia CT A/P showed enlarging 10.7 x 8.6 x 7.5 cm abnormal fluid collection concerning for possible abscess.  Successful bedside I&D by CCS on 10/30.  Mild tenderness around the incision site but no complications -CCS following, appreciate recs -Continue vancomycin, de-escalate based on wound culture and sensitivities -Dressing changes per CCS   Parox A-fib, resolved Sinus brady Remains in NSR but has had intermittent bradycardia  CHADVAs score of 2. No electrolyte abnormalities. TSH within normal limits. Patient is at a significant risk of falls at baseline.  Patient's risk of bleeding outweighs his risk of stroke so we will hold off on long-term anticoagulation.  HR has been stable in the 60s. -Telemetry -Lovenox for DVT prophylaxis -Trend and replete electrolytes   Cardiomyopathy, ischemic Last TTE 09/2017 showed EF 60 to 65% and akinesis of the basal inferior myocardium. Repeat TTE showed EF 50 to 55%, with global hypokinesis but no valvular abnormalities. -Monitor and replete electrolytes -Strict I&O's   Hypothyroidism Repeat stable at TSH 5.5. -Continue Synthroid to 225 mcg daily   Protein calorie malnutrition (HCC) -Cortrak placed on 10/30.  -Continue TF for now -N.p.o. due to aspiration event overnight.  Hx of anoxic brain injury, TBI Seizure disorder -Continue home Haldol per  tube -Continue valproic acid per tube -Continue BID Klonopin and PRN ativan for agitation  Difficult IV access -IV team unable to place a PICC line,discussed  with IR team as well, they defer to PCCM, femoral TLC placed by PCCM 11/3  Goals of care -Discussed with sister, discussed multiple comorbidities, uncertain prognosis, will get palliative medicine involved in case he has no improvement we can talk about alternative including hospice.   Body mass index is 21.32 kg/m.  DVT prophylaxis: Lovenox Code Status: (Partial) Family Communication: None at bedside, with his sister/legal guardian by phone Status is: Inpatient    Consultants:  PCCM General surgery  Consult line placed 10/30/2022, as IV team were unable to place PICC line.  Subjective:  Had a line placed yesterday by PCCM over his blood pressure has been stable.  Objective: Vitals:   11/01/22 0731 11/01/22 0900 11/01/22 0940 11/01/22 1122  BP: 96/64 97/69 (!) 89/60 (!) 83/60  Pulse: (!) 54 (!) 56 (!) 51 62  Resp: 16   18  Temp: (!) 96.5 F (35.8 C)     TempSrc: Axillary     SpO2: 92% 96% 94% 95%  Weight:      Height:        Intake/Output Summary (Last 24 hours) at 11/01/2022 1126 Last data filed at 11/01/2022 0600 Gross per 24 hour  Intake 3890.52 ml  Output 3500 ml  Net 390.52 ml   Filed Weights   10/29/22 0500 10/31/22 0406 11/01/22 0500  Weight: 62.8 kg 65.2 kg 63.6 kg    Examination:  Awake , conversant, follows some simple commands  Fair at the bases  RRR Abdomen bandage in the right abdomen, previous PEG site with minimal discharge  Trace anasarca alert, Oriented X 3, No new F.N deficits, Normal affect  A line 11/5 Right femoral central line 11/3  Data Reviewed: I have personally reviewed following labs and imaging studies  CBC: Recent Labs  Lab 10/28/22 0223 10/29/22 0259 10/30/22 0828 10/31/22 0405 11/01/22 0546  WBC 6.4 7.1 7.4 5.2 5.0  HGB 10.3* 10.7* 9.4* 9.4* 9.2*  HCT  30.9* 33.0* 28.8* 30.1* 28.7*  MCV 87.5 88.7 88.3 90.9 90.0  PLT 198 226 PLATELET CLUMPS NOTED ON SMEAR, COUNT APPEARS ADEQUATE 223 216    Basic Metabolic Panel: Recent Labs  Lab 10/28/22 0223 10/29/22 0259 10/30/22 0828 10/30/22 1254 10/31/22 0405 11/01/22 0546  NA 137 136 135  --  135 138  K 4.2 4.7 4.6  --  4.0 4.2  CL 104 102 100  --  102 105  CO2 25 27 28   --  29 28  GLUCOSE 72 87 106* 69* 135* 67*  BUN 12 12 11   --  11 11  CREATININE 0.70 0.70 0.70  --  0.74 0.75  CALCIUM 7.8* 8.0* 8.0*  --  8.0* 8.5*    GFR: Estimated Creatinine Clearance: 93.9 mL/min (by C-G formula based on SCr of 0.75 mg/dL).  Liver Function Tests: Recent Labs  Lab 10/28/22 0223  AST 42*  ALT 16  ALKPHOS 50  BILITOT 0.5  PROT 5.0*  ALBUMIN 1.6*    CBG: Recent Labs  Lab 10/31/22 2330 10/31/22 2358 11/01/22 0321 11/01/22 0349 11/01/22 0733  GLUCAP 68* 105* 68* 104* 77     Recent Results (from the past 240 hour(s))  Aerobic Culture w Gram Stain (superficial specimen)     Status: None   Collection Time:  10/24/22  6:00 AM   Specimen: Wound  Result Value Ref Range Status   Specimen Description WOUND  Final   Special Requests ABDOMEN  Final   Gram Stain   Final    FEW WBC PRESENT,BOTH PMN AND MONONUCLEAR RARE GRAM POSITIVE COCCI IN SINGLES Performed at [facility], [place] [address], [place], [place] [address]    Culture FEW METHICILLIN RESISTANT STAPHYLOCOCCUS AUREUS  Final   Report Status 10/26/2022 FINAL  Final   Organism ID, Bacteria METHICILLIN RESISTANT STAPHYLOCOCCUS AUREUS  Final      Susceptibility   Methicillin resistant staphylococcus aureus - MIC*    CIPROFLOXACIN >=8 RESISTANT Resistant     ERYTHROMYCIN >=8 RESISTANT Resistant     GENTAMICIN <=0.5 SENSITIVE Sensitive     OXACILLIN >=4 RESISTANT Resistant     TETRACYCLINE <=1 SENSITIVE Sensitive     VANCOMYCIN 1 SENSITIVE Sensitive     TRIMETH/SULFA <=10 SENSITIVE Sensitive     CLINDAMYCIN >=8 RESISTANT  Resistant     RIFAMPIN <=0.5 SENSITIVE Sensitive     Inducible Clindamycin NEGATIVE Sensitive     * FEW METHICILLIN RESISTANT STAPHYLOCOCCUS AUREUS  Aerobic Culture w Gram Stain (superficial specimen)     Status: None   Collection Time: 10/26/22  1:54 PM   Specimen: Wound  Result Value Ref Range Status   Specimen Description WOUND ABDOMEN  Final   Special Requests NONE  Final   Gram Stain   Final    RARE WBC PRESENT,BOTH PMN AND MONONUCLEAR RARE GRAM POSITIVE COCCI IN CLUSTERS    Culture   Final    RARE STAPHYLOCOCCUS AUREUS SUSCEPTIBILITIES PERFORMED ON PREVIOUS CULTURE WITHIN THE LAST 5 DAYS. Performed at [facility], [place] [address] [place], [place] [address]    Report Status 10/29/2022 FINAL  Final         Radiology Studies: No results found.      Scheduled Meds:  Chlorhexidine Gluconate Cloth  6 each Topical Daily   clonazePAM  0.5 mg Per Tube BID   enoxaparin (LOVENOX) injection  40 mg Subcutaneous Q24H   feeding supplement (PROSource TF20)  60 mL Per Tube Daily   guaiFENesin  5 mL Per Tube Q4H   haloperidol  5 mg Per Tube QPM   levothyroxine  225 mcg Per Tube Q0600   midodrine  10 mg Per Tube Q8H   pantoprazole (PROTONIX) IV  40 mg Intravenous Q12H   sertraline  75 mg Per Tube Daily   sodium chloride flush  10-40 mL Intracatheter Q12H   valproic acid  500 mg Per Tube TID   Continuous Infusions:  albumin human 60 mL/hr at 11/01/22 0600   ceFEPime (MAXIPIME) IV Stopped (11/01/22 0405)   dextrose 50 mL/hr at 11/01/22 0600   feeding supplement (KATE FARMS STANDARD 1.4)     vancomycin Stopped (11/01/22 0101)     LOS: 11 days        Phillips Climes, MD [facility]   To contact the attending provider between 7A-7P or the covering provider during after hours 7P-7A, please log into the web site [email or web address] and access using universal [place] password for that web site. If you do not have the password, please call the hospital  operator.  11/01/2022, 11:26 AM

## 2022-11-02 DIAGNOSIS — J189 Pneumonia, unspecified organism: Secondary | ICD-10-CM | POA: Diagnosis not present

## 2022-11-02 DIAGNOSIS — Z515 Encounter for palliative care: Secondary | ICD-10-CM | POA: Diagnosis not present

## 2022-11-02 DIAGNOSIS — I959 Hypotension, unspecified: Secondary | ICD-10-CM | POA: Diagnosis not present

## 2022-11-02 DIAGNOSIS — R1310 Dysphagia, unspecified: Secondary | ICD-10-CM | POA: Diagnosis not present

## 2022-11-02 DIAGNOSIS — R627 Adult failure to thrive: Secondary | ICD-10-CM | POA: Diagnosis not present

## 2022-11-02 DIAGNOSIS — Z7189 Other specified counseling: Secondary | ICD-10-CM | POA: Diagnosis not present

## 2022-11-02 DIAGNOSIS — J9601 Acute respiratory failure with hypoxia: Secondary | ICD-10-CM | POA: Diagnosis not present

## 2022-11-02 DIAGNOSIS — Z789 Other specified health status: Secondary | ICD-10-CM | POA: Diagnosis not present

## 2022-11-02 LAB — GLUCOSE, CAPILLARY
Glucose-Capillary: 61 mg/dL — ABNORMAL LOW (ref 70–99)
Glucose-Capillary: 74 mg/dL (ref 70–99)
Glucose-Capillary: 77 mg/dL (ref 70–99)
Glucose-Capillary: 81 mg/dL (ref 70–99)
Glucose-Capillary: 82 mg/dL (ref 70–99)
Glucose-Capillary: 89 mg/dL (ref 70–99)
Glucose-Capillary: 96 mg/dL (ref 70–99)

## 2022-11-02 LAB — BASIC METABOLIC PANEL
Anion gap: 5 (ref 5–15)
BUN: 13 mg/dL (ref 6–20)
CO2: 26 mmol/L (ref 22–32)
Calcium: 8 mg/dL — ABNORMAL LOW (ref 8.9–10.3)
Chloride: 103 mmol/L (ref 98–111)
Creatinine, Ser: 0.76 mg/dL (ref 0.61–1.24)
GFR, Estimated: >60 mL/min (ref >60)
Glucose, Bld: 134 mg/dL — ABNORMAL HIGH (ref 70–99)
Potassium: 3.9 mmol/L (ref 3.5–5.1)
Sodium: 134 mmol/L — ABNORMAL LOW (ref 135–145)

## 2022-11-02 MED ORDER — LORAZEPAM 2 MG/ML IJ SOLN
INTRAMUSCULAR | Status: AC
  Filled 2022-11-02: qty 1

## 2022-11-02 MED ORDER — SODIUM CHLORIDE 0.9 % IV SOLN: INTRAVENOUS | Status: DC | PRN

## 2022-11-02 MED ORDER — LORAZEPAM 2 MG/ML IJ SOLN
0.5000 mg | INTRAMUSCULAR | Status: AC
  Administered 2022-11-02: 0.5 mg via INTRAVENOUS

## 2022-11-02 MED ORDER — LORAZEPAM 2 MG/ML IJ SOLN
0.5000 mg | Freq: Once | INTRAMUSCULAR | Status: AC | PRN
  Administered 2022-11-02: 0.5 mg via INTRAVENOUS
  Filled 2022-11-02: qty 1

## 2022-11-02 MED ORDER — ALBUMIN HUMAN 25 % IV SOLN
25.0000 g | Freq: Once | INTRAVENOUS | Status: AC
  Administered 2022-11-02: 25 g via INTRAVENOUS
  Filled 2022-11-02: qty 100

## 2022-11-02 NOTE — Plan of Care (Signed)
Patient fully dependent for ADLs. Continues to receive enteral nutrition from NGT. CVC remains in place.   Problem: Education: Goal: Knowledge of General Education information will improve Description: Including pain rating scale, medication(s)/side effects and non-pharmacologic comfort measures Outcome: Not Progressing   Problem: Health Behavior/Discharge Planning: Goal: Ability to manage health-related needs will improve Outcome: Not Progressing   Problem: Clinical Measurements: Goal: Ability to maintain clinical measurements within normal limits will improve Outcome: Not Progressing Goal: Will remain free from infection Outcome: Not Progressing Goal: Diagnostic test results will improve Outcome: Not Progressing Goal: Respiratory complications will improve Outcome: Not Progressing Goal: Cardiovascular complication will be avoided Outcome: Not Progressing   Problem: Activity: Goal: Risk for activity intolerance will decrease Outcome: Not Progressing   Problem: Nutrition: Goal: Adequate nutrition will be maintained Outcome: Not Progressing   Problem: Coping: Goal: Level of anxiety will decrease Outcome: Not Progressing   Problem: Elimination: Goal: Will not experience complications related to bowel motility Outcome: Not Progressing Goal: Will not experience complications related to urinary retention Outcome: Not Progressing   Problem: Pain Managment: Goal: General experience of comfort will improve Outcome: Not Progressing   Problem: Safety: Goal: Ability to remain free from injury will improve Outcome: Not Progressing   Problem: Skin Integrity: Goal: Risk for impaired skin integrity will decrease Outcome: Not Progressing   Problem: Safety: Goal: Non-violent Restraint(s) Outcome: Not Progressing

## 2022-11-02 NOTE — Progress Notes (Signed)
PROGRESS NOTE    [name]  OHY:[id number] DOB: [birth year] DOA: 10/21/2022 PCP: No primary care provider on file.    Chief Complaint  Patient presents with   Respiratory Arrest    Brief Narrative:   56 year old man admitted from [facility] with significant for Anoxic Brain injury secondary to remote cardiac arrest, PEG dependent(removed), CAD, hypothyroidism, seizure disorder, paroxysmal A. fib not on systemic anticoagulation and adrenal insufficiency  -Patient presents from SNF hypoxic in the 70s, agitated, likely with aspiration event, required intubation and admission to ICU. ETT 10/25-10/28. Found to have large abscess on the R abdominal wall s/p bedside I&D by surgery ,  Patient improved, successfully extubated and transferred to [facility] service 10/29/2022.    Assessment & Plan:   Principal Problem:   Respiratory failure (HCC) Active Problems:   Anoxic brain injury (HCC)   Protein calorie malnutrition (HCC)   Cardiomyopathy, ischemic   Quadriplegia (HCC)   Acute hypoxemic respiratory failure (HCC)   Pneumonia of right lower lobe due to infectious organism   Malnutrition of moderate degree   Pressure injury of skin   Difficult intravenous access   Septic shock 2/2 pneumonia HCAP Intra-abdominal abscess -Septic shock present on admission, requiring pressor support -Pathophysiology of sepsis has resolved, off pressor, but blood pressure remains soft, remains on midodrine. -Continue vancomycin( to finish today) , and apparently had aspiration event 11/3, for which he is back on cefepime( day 3/5) -Continue midodrine 10 mg q8h -Blood pressure has improved with scheduled albumin.  Hypotension  -Patient persistently hypotensive, PCCM  consulted, he does not appear to be an active sepsis currently, A line was placed, and there is a big discrepancy between actual blood pressure reading via A-line  VS blood pressure cuff check at least of 20 mmHg, so should be able to  tolerate systolic blood pressure upper 94W or 80s as it does actually reflect appropriate blood pressure in the 90s or 100.   Acute respiratory failure with hypoxia (HCC) Respiratory acidosis, resolved -Currently extubated, requiring nasal cannula. - Was encouraged to use incentive spirometry and flutter valve. -hypertonic 3% nebulized solution -Start guaifenesin per tube to help loosen secretions -Continue O2 supplementation, wean for O2 sat >92% -Discontinue scopolamine patch   Hypoglycemia -Remains on D10 LR, hopefully can be decreased once tolerating tube feed.   R abdominal wall abscess Hx of abdominal hernia CT A/P showed enlarging 10.7 x 8.6 x 7.5 cm abnormal fluid collection concerning for possible abscess.  Successful bedside I&D by CCS on 10/30.  Mild tenderness around the incision site but no complications -CCS following, appreciate recs -Continue vancomycin, de-escalate based on wound culture and sensitivities -Dressing changes per CCS -Use IV Protonix for evidence of gastritis on imaging.   Parox A-fib, resolved Sinus brady Remains in NSR but has had intermittent bradycardia  CHADVAs score of 2. No electrolyte abnormalities. TSH within normal limits. Patient is at a significant risk of falls at baseline.  Patient's risk of bleeding outweighs his risk of stroke so we will hold off on long-term anticoagulation.  HR has been stable in the 60s. -Telemetry -Lovenox for DVT prophylaxis -Trend and replete electrolytes   Cardiomyopathy, ischemic Last TTE 09/2017 showed EF 60 to 65% and akinesis of the basal inferior myocardium. Repeat TTE showed EF 50 to 55%, with global hypokinesis but no valvular abnormalities. -Monitor and replete electrolytes -Strict I&O's   Hypothyroidism Repeat stable at TSH 5.5. -Continue Synthroid to 225 mcg daily   Protein calorie malnutrition (  HCC) -Cortrak placed on 10/30.  -Continue TF for now   Hx of anoxic brain injury, TBI Seizure  disorder -Continue home Haldol per tube -Continue valproic acid per tube -Continue BID Klonopin and PRN ativan for agitation  Difficult IV access -IV team unable to place a PICC line,discussed  with IR team as well, they defer to PCCM, femoral TLC placed by PCCM 11/3  Goals of care -Discussed with sister, discussed multiple comorbidities, uncertain prognosis, will get palliative medicine involved in case he has no improvement we can talk about alternative including hospice.   Body mass index is 21.32 kg/m.  DVT prophylaxis: Lovenox Code Status: (Partial) Family Communication: None at bedside, discussed  with his sister/legal guardian by phone 11/5 Status is: Inpatient    Consultants:  PCCM General surgery Palliative  Consult line placed 10/30/2022, as IV team were unable to place PICC line.  Subjective:  Had an episodes of low blood pressure responded to fluid bolus and IV albumin.  Objective: Vitals:   11/01/22 0731 11/01/22 0900 11/01/22 0940 11/01/22 1122  BP: 96/64 97/69 (!) 89/60 (!) 83/60  Pulse: (!) 54 (!) 56 (!) 51 62  Resp: 16   18  Temp: (!) 96.5 F (35.8 C)     TempSrc: Axillary     SpO2: 92% 96% 94% 95%  Weight:      Height:        Intake/Output Summary (Last 24 hours) at 11/01/2022 1126 Last data filed at 11/01/2022 0600 Gross per 24 hour  Intake 3890.52 ml  Output 3500 ml  Net 390.52 ml   Filed Weights   10/29/22 0500 10/31/22 0406 11/01/22 0500  Weight: 62.8 kg 65.2 kg 63.6 kg    Examination:  Awake, chronically ill-appearing Good air entry, limited at the bases. Lower rate and rhythm Abdomen bandage in the right abdomen, previous PEG site with minimal discharge  Trace anasarca  A line 11/5 Right femoral central line 11/3  Data Reviewed: I have personally reviewed following labs and imaging studies  CBC: Recent Labs  Lab 10/28/22 0223 10/29/22 0259 10/30/22 0828 10/31/22 0405 11/01/22 0546  WBC 6.4 7.1 7.4 5.2 5.0  HGB 10.3*  10.7* 9.4* 9.4* 9.2*  HCT 30.9* 33.0* 28.8* 30.1* 28.7*  MCV 87.5 88.7 88.3 90.9 90.0  PLT 198 226 PLATELET CLUMPS NOTED ON SMEAR, COUNT APPEARS ADEQUATE 223 216    Basic Metabolic Panel: Recent Labs  Lab 10/28/22 0223 10/29/22 0259 10/30/22 0828 10/30/22 1254 10/31/22 0405 11/01/22 0546  NA 137 136 135  --  135 138  K 4.2 4.7 4.6  --  4.0 4.2  CL 104 102 100  --  102 105  CO2 25 27 28   --  29 28  GLUCOSE 72 87 106* 69* 135* 67*  BUN 12 12 11   --  11 11  CREATININE 0.70 0.70 0.70  --  0.74 0.75  CALCIUM 7.8* 8.0* 8.0*  --  8.0* 8.5*    GFR: Estimated Creatinine Clearance: 93.9 mL/min (by C-G formula based on SCr of 0.75 mg/dL).  Liver Function Tests: Recent Labs  Lab 10/28/22 0223  AST 42*  ALT 16  ALKPHOS 50  BILITOT 0.5  PROT 5.0*  ALBUMIN 1.6*    CBG: Recent Labs  Lab 10/31/22 2330 10/31/22 2358 11/01/22 0321 11/01/22 0349 11/01/22 0733  GLUCAP 68* 105* 68* 104* 77     Recent Results (from the past 240 hour(s))  Aerobic Culture w Gram Stain (superficial specimen)  Status: None   Collection Time: 10/24/22  6:00 AM   Specimen: Wound  Result Value Ref Range Status   Specimen Description WOUND  Final   Special Requests ABDOMEN  Final   Gram Stain   Final    FEW WBC PRESENT,BOTH PMN AND MONONUCLEAR RARE GRAM POSITIVE COCCI IN SINGLES Performed at [facility], [place] [address] [place], [place] [address]    Culture FEW METHICILLIN RESISTANT STAPHYLOCOCCUS AUREUS  Final   Report Status 10/26/2022 FINAL  Final   Organism ID, Bacteria METHICILLIN RESISTANT STAPHYLOCOCCUS AUREUS  Final      Susceptibility   Methicillin resistant staphylococcus aureus - MIC*    CIPROFLOXACIN >=8 RESISTANT Resistant     ERYTHROMYCIN >=8 RESISTANT Resistant     GENTAMICIN <=0.5 SENSITIVE Sensitive     OXACILLIN >=4 RESISTANT Resistant     TETRACYCLINE <=1 SENSITIVE Sensitive     VANCOMYCIN 1 SENSITIVE Sensitive     TRIMETH/SULFA <=10 SENSITIVE Sensitive      CLINDAMYCIN >=8 RESISTANT Resistant     RIFAMPIN <=0.5 SENSITIVE Sensitive     Inducible Clindamycin NEGATIVE Sensitive     * FEW METHICILLIN RESISTANT STAPHYLOCOCCUS AUREUS  Aerobic Culture w Gram Stain (superficial specimen)     Status: None   Collection Time: 10/26/22  1:54 PM   Specimen: Wound  Result Value Ref Range Status   Specimen Description WOUND ABDOMEN  Final   Special Requests NONE  Final   Gram Stain   Final    RARE WBC PRESENT,BOTH PMN AND MONONUCLEAR RARE GRAM POSITIVE COCCI IN CLUSTERS    Culture   Final    RARE STAPHYLOCOCCUS AUREUS SUSCEPTIBILITIES PERFORMED ON PREVIOUS CULTURE WITHIN THE LAST 5 DAYS. Performed at [facility], [place] [address], [place], [place] [address]    Report Status 10/29/2022 FINAL  Final         Radiology Studies: No results found.      Scheduled Meds:  Chlorhexidine Gluconate Cloth  6 each Topical Daily   clonazePAM  0.5 mg Per Tube BID   enoxaparin (LOVENOX) injection  40 mg Subcutaneous Q24H   feeding supplement (PROSource TF20)  60 mL Per Tube Daily   guaiFENesin  5 mL Per Tube Q4H   haloperidol  5 mg Per Tube QPM   levothyroxine  225 mcg Per Tube Q0600   midodrine  10 mg Per Tube Q8H   pantoprazole (PROTONIX) IV  40 mg Intravenous Q12H   sertraline  75 mg Per Tube Daily   sodium chloride flush  10-40 mL Intracatheter Q12H   valproic acid  500 mg Per Tube TID   Continuous Infusions:  albumin human 60 mL/hr at 11/01/22 0600   ceFEPime (MAXIPIME) IV Stopped (11/01/22 0405)   dextrose 50 mL/hr at 11/01/22 0600   feeding supplement (KATE FARMS STANDARD 1.4)     vancomycin Stopped (11/01/22 0101)     LOS: 11 days        Phillips Climes, MD [facility]   To contact the attending provider between 7A-7P or the covering provider during after hours 7P-7A, please log into the web site [email or web address] and access using universal [place] password for that web site. If you do not have the password, please  call the hospital operator.  11/01/2022, 11:26 AM

## 2022-11-02 NOTE — Progress Notes (Signed)
Speech Language Pathology Treatment: Dysphagia  Patient Details Name: [name] MRN: [id number] DOB: 05/07/1966 Today's Date: 11/02/2022 Time: 6720-9470 SLP Time Calculation (min) (ACUTE ONLY): 21 min  Assessment / Plan / Recommendation Clinical Impression  [name] was nonverbal this am, slow to awaken. He was made NPO since last by SLP due to aspiration event per RN (potentially related to N/V of TF per RN). He participated in his own oral care, using LUE to brush his teeth with assist from therapist.  Oral care elicited wet, congested coughing.  Followed simple oral motor commands.  Accepted small sips of honey-thick liquid from a straw, leading to immediate and consistent cough response, concerning for aspiration. He is not ready to resume an oral diet. Will continue to follow for readiness.    HPI HPI: 56 year old male with prior history of cardiac arrest with anoxic brain injury who presented with acute hypoxic respiratory failure in the setting of bilateral lower lobe pneumonia requiring endotracheal intubation.  Pt was intubated from 10/25-10/28.  MBS in 2018 recommending Dysphagia 3 solids and honey-thick liquids.      SLP Plan  Continue with current plan of care      Recommendations for follow up therapy are one component of a multi-disciplinary discharge planning process, led by the attending physician.  Recommendations may be updated based on patient status, additional functional criteria and insurance authorization.    Recommendations  Diet recommendations: NPO Medication Administration: Via alternative means                Oral Care Recommendations: Oral care QID Plan: Continue with current plan of care           [name]  11/02/2022, 9:35 AM

## 2022-11-02 NOTE — Progress Notes (Signed)
Subjective/Chief Complaint: Nonverbal this am   Objective: Vital signs in last 24 hours: Temp:  [96.5 F (35.8 C)-98.1 F (36.7 C)] 97.9 F (36.6 C) (11/06 0700) Pulse Rate:  [51-63] 60 (11/05 1806) Resp:  [5-20] 20 (11/05 1806) BP: (83-100)/(59-69) 100/62 (11/05 1806) SpO2:  [93 %-99 %] 97 % (11/05 1806) Arterial Line BP: (95-118)/(45-60) 101/50 (11/05 1700) Weight:  [69.9 kg] 69.9 kg (11/06 0500) Last BM Date : 10/31/22  Intake/Output from previous day: 11/05 0701 - 11/06 0700 In: 1866.7 [I.V.:1355.5; IV Piggyback:511.2] Out: 3150 [Urine:3150] Intake/Output this shift: No intake/output data recorded.  GI abdomen soft wound llq still with some purulent drainage, I repacked this and drained most of what I could get out, small amt induration  Lab Results:  Recent Labs    10/31/22 0405 11/01/22 0546  WBC 5.2 5.0  HGB 9.4* 9.2*  HCT 30.1* 28.7*  PLT 223 216   BMET Recent Labs    11/01/22 0546 11/02/22 0347  NA 138 134*  K 4.2 3.9  CL 105 103  CO2 28 26  GLUCOSE 67* 134*  BUN 11 13  CREATININE 0.75 0.76  CALCIUM 8.5* 8.0*   PT/INR No results for input(s): "LABPROT", "INR" in the last 72 hours. ABG No results for input(s): "PHART", "HCO3" in the last 72 hours.  Invalid input(s): "PCO2", "PO2"  Studies/Results: No results found.  Anti-infectives: Anti-infectives (From admission, onward)    Start     Dose/Rate Route Frequency Ordered Stop   10/30/22 1100  ceFEPIme (MAXIPIME) 2 g in sodium chloride 0.9 % 100 mL IVPB        2 g 200 mL/hr over 30 Minutes Intravenous Every 8 hours 10/30/22 0954     10/29/22 0000  vancomycin (VANCOREADY) IVPB 750 mg/150 mL        750 mg 150 mL/hr over 60 Minutes Intravenous Every 12 hours 10/28/22 1425 11/02/22 2359   10/27/22 1315  vancomycin (VANCOCIN) IVPB 1000 mg/200 mL premix  Status:  Discontinued        1,000 mg 200 mL/hr over 60 Minutes Intravenous Every 12 hours 10/27/22 1224 10/28/22 1425   10/27/22 1230   vancomycin (VANCOCIN) IVPB 1000 mg/200 mL premix  Status:  Discontinued        1,000 mg 200 mL/hr over 60 Minutes Intravenous Every 12 hours 10/27/22 1222 10/27/22 1223   10/24/22 2200  vancomycin (VANCOCIN) IVPB 1000 mg/200 mL premix  Status:  Discontinued        1,000 mg 200 mL/hr over 60 Minutes Intravenous Every 12 hours 10/24/22 1138 10/27/22 1222   10/24/22 1230  vancomycin (VANCOREADY) IVPB 1250 mg/250 mL        1,250 mg 166.7 mL/hr over 90 Minutes Intravenous  Once 10/24/22 1138 10/24/22 1431   10/23/22 1130  cefTRIAXone (ROCEPHIN) 2 g in sodium chloride 0.9 % 100 mL IVPB        2 g 200 mL/hr over 30 Minutes Intravenous Every 24 hours 10/23/22 1041 10/26/22 1324   10/22/22 2200  ceFEPIme (MAXIPIME) 2 g in sodium chloride 0.9 % 100 mL IVPB  Status:  Discontinued        2 g 200 mL/hr over 30 Minutes Intravenous Every 8 hours 10/22/22 1559 10/23/22 1041   10/22/22 0900  vancomycin (VANCOREADY) IVPB 750 mg/150 mL  Status:  Discontinued        750 mg 150 mL/hr over 60 Minutes Intravenous Every 12 hours 10/21/22 2041 10/22/22 1559   10/22/22 0700  ceFEPIme (  MAXIPIME) 2 g in sodium chloride 0.9 % 100 mL IVPB  Status:  Discontinued        2 g 200 mL/hr over 30 Minutes Intravenous Every 12 hours 10/21/22 2041 10/22/22 1559   10/21/22 1900  vancomycin (VANCOREADY) IVPB 1750 mg/350 mL        1,750 mg 175 mL/hr over 120 Minutes Intravenous  Once 10/21/22 1854 10/21/22 2125   10/21/22 1900  ceFEPIme (MAXIPIME) 2 g in sodium chloride 0.9 % 100 mL IVPB        2 g 200 mL/hr over 30 Minutes Intravenous  Once 10/21/22 1854 10/21/22 1924   10/21/22 1900  azithromycin (ZITHROMAX) 500 mg in sodium chloride 0.9 % 250 mL IVPB  Status:  Discontinued        500 mg 250 mL/hr over 60 Minutes Intravenous Every 24 hours 10/21/22 1854 10/23/22 1041   10/21/22 1845  vancomycin (VANCOCIN) IVPB 1000 mg/200 mL premix  Status:  Discontinued        1,000 mg 200 mL/hr over 60 Minutes Intravenous  Once 10/21/22  1835 10/21/22 1851   10/21/22 1845  levofloxacin (LEVAQUIN) IVPB 750 mg  Status:  Discontinued        750 mg 100 mL/hr over 90 Minutes Intravenous  Once 10/21/22 1835 10/21/22 1849       Assessment/Plan: Right abdominal wall abscess History of infected mesh - s/p bedside I&D 10/30 - culture: MRSA, on Vancomycin, (also on cefepime for PNA) - needs bid dressing changes   ID - above FEN - TF VTE - lovenox Foley - none   Right lumbar hernia Acute respiratory failure - extubated 10/28  Bradycardia  Sepsis Pneumonia  H/o adrenal insufficiency Hx anoxic brain injury, TBI      Emelia Loron 11/02/2022

## 2022-11-02 NOTE — Consult Note (Signed)
Consultation Note Date: 11/02/2022   Patient Name: [name]  DOB: 09-24-1966  MRN: [id number]  Age / Sex: 56 y.o., male  PCP: No primary care provider on file. Referring Physician: Albertine Patricia, MD  Reason for Consultation: Establishing goals of care and Psychosocial/spiritual support  HPI/Patient Profile: 55 y.o. male  admitted on 10/21/2022   from [facility] for hypoxia and unresponsiveness.    Admitted for treatment stabilization.  Work up significant for respiratory failure in the setting of bilateral lower lobe pneumonia.  Intubated.  Found to have large abscesses on right abdominal wall   Chronic SNF resident due to demential and anoxic brain injury.  Patient is bed bound bed bound.  Family report continued physical and functional decline over the past year.  In conversation with the patient's sister she tells me that patient's mother was guardian up until a year ago when she passed away, patient's sister is now legal guardian. Patient does not have capacity to make medical decisions.    Pending decisions regarding treatment option decision, advanced directive decisions and anticipatory care needs.   Clinical Assessment and Goals of Care:  This NP Wadie Lessen reviewed medical records, received report from team, assessed the patient and then spoke to the patient's sister by telephone   to discuss diagnosis, prognosis, GOC, EOL wishes disposition and options.   Concept of Palliative Care was introduced as specialized medical care for people and their families living with serious illness.  If focuses on providing relief from the symptoms and stress of a serious illness.  The goal is to improve quality of life for both the patient and the family. Values and goals of care important to patient and family were attempted to be elicited.  Created space and opportunity for family to explore  thoughts and feelings regarding current medical situation.  Patient's sister speaks to her brother over 10 years course living post anoxic brain injury.  Sister tells me that before patient's mother died she was questioning her decisions regarding aggressive life-prolonging measures for her son, wondering "if this was the best thing for him".  Sister/ [name] now faces the difficult decisions regarding life prolonging measures versus full comfort support.  Education offered on the likely trajectory of patient who is failing to thrive, anoxic brain injury, limited mobility and dysphagia.  We discussed his high risk for decompensation and continued infection    A  discussion was had today regarding advanced directives.  Concepts specific to code status, artifical feeding and hydration, continued IV antibiotics and rehospitalization was had.    The difference between a aggressive medical intervention path  and a palliative comfort care path for this patient at this time was had.     Concept of adult failure to thrive and the limitations of medical interventions to provide quality of life when the body does fail to thrive.    Natural trajectory and expectations at EOL were discussed.  Questions and concerns addressed.  Patient  encouraged to call with questions or concerns.  PMT will continue to support holistically.         Patient's sister/[name] is patient's legal guardian.  I left contact information with [name], she tells me she will call me back later today about scheduling an in person family meeting.  I await her call back  The patient does have 3 adult 3 sons who according to legal guardian are estranged  Contact information left with patient's sister, she plans to call me later today in an attempt to set up an in person family meeting.  Await callback  PMT will continue to support holistically    SUMMARY OF RECOMMENDATIONS    Code Status/Advance Care Planning:  Limited  code Educated patient/family to consider DNR/DNI status understanding evidenced based poor outcomes in similar hospitalized patient, as the cause of arrest is likely associated with advanced chronic illness rather than an easily reversible acute cardio-pulmonary event.    Palliative Prophylaxis:  Aspiration, Bowel Regimen, Delirium Protocol, Eye Care, Frequent Pain Assessment, and Oral Care  Additional Recommendations (Limitations, Scope, Preferences): Full Scope Treatment  Psycho-social/Spiritual:  Desire for further Chaplaincy support:no Additional Recommendations: Education on Hospice  Prognosis:  Unable to determine  Discharge Planning: To Be Determined      Primary Diagnoses: Present on Admission:  Acute hypoxemic respiratory failure ([place])  Anoxic brain injury ([place])  Protein calorie malnutrition ([place])  Cardiomyopathy, ischemic  Quadriplegia ([place])  Respiratory failure ([place])   I have reviewed the medical record, interviewed the patient and family, and examined the patient. The following aspects are pertinent.  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[place])    History of heart attack    History of recurrent UTIs    Hyperlipemia    Hypothyroidism    Mental disorder    Myocardial infarction ([place])    Pneumoperitoneum of unknown etiology 12/21/2011   Quadriplegia (HCC)    Reflux    Seizure disorder (HCC)    Weakness of both legs    Social History   Socioeconomic History   Marital status: Divorced    Spouse name: Not on file   Number of children: Not on file   Years of education: Not on file   Highest education level: Not on file  Occupational  History   Not on file  Tobacco Use   Smoking status: Never   Smokeless tobacco: Never  Vaping Use   Vaping Use: Never used  Substance and Sexual Activity   Alcohol use: No   Drug use: No   Sexual activity: Never  Other Topics Concern   Not on file  Social History Narrative   Not on file   Social Determinants of Health   Financial Resource Strain: Not on file  Food Insecurity: Not on file  Transportation Needs: Not on file  Physical Activity: Not on file  Stress: Not on file  Social Connections: Not on file   No family history on file. Scheduled Meds:  Chlorhexidine Gluconate Cloth  6 each Topical Daily   clonazePAM  0.5 mg Per Tube BID   enoxaparin (LOVENOX) injection  40  mg Subcutaneous Q24H   feeding supplement (PROSource TF20)  60 mL Per Tube Daily   guaiFENesin  5 mL Per Tube Q4H   haloperidol  5 mg Per Tube QPM   levothyroxine  225 mcg Per Tube Q0600   midodrine  10 mg Per Tube Q8H   pantoprazole (PROTONIX) IV  40 mg Intravenous Q12H   sertraline  75 mg Per Tube Daily   sodium chloride flush  10-40 mL Intracatheter Q12H   valproic acid  500 mg Per Tube TID   Continuous Infusions:  ceFEPime (MAXIPIME) IV 200 mL/hr at 11/02/22 1200   dextrose 25 mL/hr at 11/02/22 1200   feeding supplement (KATE FARMS STANDARD 1.4) 325 mL (11/02/22 0200)   lactated ringers 50 mL/hr at 11/02/22 1200   PRN Meds:.acetaminophen (TYLENOL) oral liquid 160 mg/5 mL, ipratropium-albuterol, ondansetron, mouth rinse, polyethylene glycol, senna-docusate, sodium chloride flush Medications Prior to Admission:  Prior to Admission medications   Medication Sig Start Date End Date Taking? Authorizing Provider  acetaminophen (TYLENOL) 325 MG tablet Take 650 mg by mouth every 4 (four) hours as needed for mild pain (CANNOT EXCEED A SUM TOTAL OF 4,000 MG/DAY FROM ALL COMBINED SOURCES).   Yes [provider]  albuterol (ACCUNEB) 0.63 MG/3ML nebulizer solution Take 1 ampule by nebulization every  6 (six) hours as needed for wheezing or shortness of breath (COPD).   Yes [provider]  aspirin 81 MG chewable tablet Chew 81 mg by mouth every morning.   Yes [provider]  calcium carbonate (TUMS - DOSED IN MG ELEMENTAL CALCIUM) 500 MG chewable tablet Chew 1 tablet by mouth every 12 (twelve) hours as needed (FOR GERD).   Yes [provider]  clonazePAM (KLONOPIN) 0.5 MG tablet Take 0.5 mg by mouth 2 (two) times daily. 0830 and 1630   Yes [provider]  divalproex (DEPAKOTE SPRINKLE) 125 MG capsule Take 500 mg by mouth 3 (three) times daily. 0830, 1230, and 2030 07/18/13  Yes [provider]  docusate sodium (COLACE) 100 MG capsule Take 100 mg by mouth every morning.   Yes [provider]  haloperidol (HALDOL) 5 MG tablet Take 5 mg by mouth every evening.   Yes [provider]  Magnesium Hydroxide (MILK OF MAGNESIA PO) Take 30 mLs by mouth daily as needed (for constipation).   Yes [provider]  omeprazole (PRILOSEC) 20 MG capsule Take 20 mg by mouth at bedtime.   Yes [provider]  polyethylene glycol (MIRALAX / GLYCOLAX) packet Take 17 g by mouth 2 (two) times daily. 0900 and 2100   Yes [provider]  REFRESH OPTIVE ADVANCED PF 0.5-1-0.5 % SOLN Place 1 drop into both eyes 3 (three) times daily.   Yes [provider]  REMERON 15 MG tablet Take 15 mg by mouth See admin instructions. Take 15 mg by mouth at 4:30 PM daily for decreased appetite/weight loss 10/21/22  Yes [provider]  senna (SENOKOT) 8.6 MG tablet Take 2 tablets by mouth 2 (two) times daily. 0900 and 2000   Yes [provider]  sertraline (ZOLOFT) 50 MG tablet Take 75 mg by mouth every morning.   Yes [provider]  SYNTHROID 200 MCG tablet Take 200 mcg by mouth daily before breakfast.   Yes [provider]  SYNTHROID 25 MCG tablet Take 25 mcg by mouth daily before breakfast.   Yes  [provider]  cephALEXin (KEFLEX) 500 MG capsule Take 1 capsule (500 mg total)  by mouth 4 (four) times daily. Patient not taking: Reported on 10/22/2022 05/15/22   Daleen Bo, MD  pantoprazole sodium (PROTONIX) 40 mg/20 mL PACK Take 40 mg by mouth every morning. Patient not taking: Reported on 05/24/2022 11/30/11   Ernest Haber, MD   Allergies  Allergen Reactions   Ativan [Lorazepam] Other (See Comments)    Tolerated 09/2022 admission   Latex Dermatitis and Other (See Comments)    "Allergic," per MAR   Morphine And Related Other (See Comments)    Reaction not listed on MAR   Other Other (See Comments)    Soy Sauce  Reaction not listed on MAR   Penicillins Other (See Comments)    Patient tolerated cephalosporins in past. "Allergic," per MAR.  Has patient had a PCN reaction causing immediate rash, facial/tongue/throat swelling, SOB or lightheadedness with hypotension: unknown Has patient had a PCN reaction causing severe rash involving mucus membranes or skin necrosis: unknown Has patient had a PCN reaction that required hospitalization: no Has patient had a PCN reaction occurring within the last 10 years: yes If all of the above answe   Pyridium [Phenazopyridine Hcl] Other (See Comments)    Reaction not listed on MAR   Soy Allergy Other (See Comments)    Reaction not listed on MAR   Soybean (Diagnostic) Other (See Comments)    Reaction not listed no MAR   Review of Systems  Unable to perform ROS: Acuity of condition    Physical Exam Cardiovascular:     Rate and Rhythm: Normal rate.  Pulmonary:     Effort: Pulmonary effort is normal.  Skin:    General: Skin is warm and dry.  Neurological:     Mental Status: He is alert.     Vital Signs: BP 112/64   Pulse (!) 52   Temp 97.9 F (36.6 C) (Axillary)   Resp (!) 21   Ht 5\' 8"  (1.727 m)   Wt 69.9 kg   SpO2 96%   BMI 23.43 kg/m  Pain Scale: CPOT   Pain Score: Asleep   SpO2: SpO2: 96 % O2  Device:SpO2: 96 % O2 Flow Rate: .O2 Flow Rate (L/min): 2 L/min  IO: Intake/output summary:  Intake/Output Summary (Last 24 hours) at 11/02/2022 1349 Last data filed at 11/02/2022 1200 Gross per 24 hour  Intake 2755.43 ml  Output 2150 ml  Net 605.43 ml    LBM: Last BM Date : 10/31/22 Baseline Weight: Weight: 80 kg Most recent weight: Weight: 69.9 kg     Palliative Assessment/Data: 20%   Discussed with Dr Waldron Labs   Time In: 1200 Time Out: 1320 Time Total: 80 minutes Greater than 50%  of this time was spent counseling and coordinating care related to the above assessment and plan.  Signed by: Wadie Lessen, NP   Please contact Palliative Medicine Team phone at [phone] for questions and concerns.  For individual provider: See Shea Evans

## 2022-11-02 NOTE — Progress Notes (Addendum)
[place] Progress Note Patient Name: [name] DOB: [birth year] MRN: [id number]   Date of Service  11/02/2022  HPI/Events of Note  Pt with borderline low BP, MAP 66 but SBP in 80s.  BP has been running low over the past few days, and pt has been on midodrine  eICU Interventions  Will give volume in the form of an albumin bolus for now.  If BP does not respond, then will plan to give AM dose of midodrine 10mg  early.         [name] 11/02/2022, 5:40 AM  6:37 AM - Pt persistently hypotensive following bolus. Will give AM midodrine now.

## 2022-11-02 NOTE — Progress Notes (Signed)
Pt arrived to 3E, VSS, orders checked and released, Chg complete.   Alvis Lemmings, RN 11/02/2022 6:38 PM

## 2022-11-02 NOTE — Progress Notes (Signed)
Patient self-removed Cortrak. Dr. Nevada Crane notified. D10W infusion increased per orders. Per Dr. Nevada Crane, okay to wait until AM for Cortrak team to replace tube versus attempting to place NGT overnight tonight.

## 2022-11-03 ENCOUNTER — Inpatient Hospital Stay (HOSPITAL_COMMUNITY): Payer: 59

## 2022-11-03 DIAGNOSIS — Z789 Other specified health status: Secondary | ICD-10-CM | POA: Diagnosis not present

## 2022-11-03 DIAGNOSIS — Z7189 Other specified counseling: Secondary | ICD-10-CM | POA: Diagnosis not present

## 2022-11-03 DIAGNOSIS — I959 Hypotension, unspecified: Secondary | ICD-10-CM | POA: Diagnosis not present

## 2022-11-03 DIAGNOSIS — J9621 Acute and chronic respiratory failure with hypoxia: Secondary | ICD-10-CM | POA: Diagnosis not present

## 2022-11-03 DIAGNOSIS — G931 Anoxic brain damage, not elsewhere classified: Secondary | ICD-10-CM | POA: Diagnosis not present

## 2022-11-03 DIAGNOSIS — J189 Pneumonia, unspecified organism: Secondary | ICD-10-CM | POA: Diagnosis not present

## 2022-11-03 DIAGNOSIS — E43 Unspecified severe protein-calorie malnutrition: Secondary | ICD-10-CM

## 2022-11-03 DIAGNOSIS — J9601 Acute respiratory failure with hypoxia: Secondary | ICD-10-CM | POA: Diagnosis not present

## 2022-11-03 LAB — GLUCOSE, CAPILLARY
Glucose-Capillary: 109 mg/dL — ABNORMAL HIGH (ref 70–99)
Glucose-Capillary: 145 mg/dL — ABNORMAL HIGH (ref 70–99)
Glucose-Capillary: 46 mg/dL — ABNORMAL LOW (ref 70–99)
Glucose-Capillary: 61 mg/dL — ABNORMAL LOW (ref 70–99)
Glucose-Capillary: 66 mg/dL — ABNORMAL LOW (ref 70–99)
Glucose-Capillary: 68 mg/dL — ABNORMAL LOW (ref 70–99)
Glucose-Capillary: 68 mg/dL — ABNORMAL LOW (ref 70–99)
Glucose-Capillary: 69 mg/dL — ABNORMAL LOW (ref 70–99)
Glucose-Capillary: 70 mg/dL (ref 70–99)
Glucose-Capillary: 76 mg/dL (ref 70–99)
Glucose-Capillary: 80 mg/dL (ref 70–99)
Glucose-Capillary: 82 mg/dL (ref 70–99)
Glucose-Capillary: 85 mg/dL (ref 70–99)
Glucose-Capillary: 91 mg/dL (ref 70–99)
Glucose-Capillary: 91 mg/dL (ref 70–99)
Glucose-Capillary: 95 mg/dL (ref 70–99)
Glucose-Capillary: 95 mg/dL (ref 70–99)

## 2022-11-03 LAB — BASIC METABOLIC PANEL
Anion gap: 7 (ref 5–15)
BUN: 17 mg/dL (ref 6–20)
CO2: 26 mmol/L (ref 22–32)
Calcium: 8.5 mg/dL — ABNORMAL LOW (ref 8.9–10.3)
Chloride: 102 mmol/L (ref 98–111)
Creatinine, Ser: 0.78 mg/dL (ref 0.61–1.24)
GFR, Estimated: >60 mL/min (ref >60)
Glucose, Bld: 76 mg/dL (ref 70–99)
Potassium: 4.4 mmol/L (ref 3.5–5.1)
Sodium: 135 mmol/L (ref 135–145)

## 2022-11-03 MED ORDER — HALOPERIDOL LACTATE 5 MG/ML IJ SOLN
2.0000 mg | Freq: Four times a day (QID) | INTRAMUSCULAR | Status: DC | PRN
  Administered 2022-11-04 (×2): 2 mg via INTRAVENOUS
  Filled 2022-11-03 (×2): qty 1

## 2022-11-03 MED ORDER — DEXTROSE 50 % IV SOLN
INTRAVENOUS | Status: AC
  Filled 2022-11-03: qty 50

## 2022-11-03 MED ORDER — DEXTROSE 50 % IV SOLN
50.0000 mL | INTRAVENOUS | Status: DC | PRN
  Administered 2022-11-03 – 2022-11-05 (×10): 50 mL via INTRAVENOUS
  Filled 2022-11-03 (×11): qty 50

## 2022-11-03 MED ORDER — DEXTROSE 50 % IV SOLN
50.0000 mL | INTRAVENOUS | Status: AC | PRN
  Administered 2022-11-03: 50 mL via INTRAVENOUS

## 2022-11-03 MED ORDER — DEXTROSE 50 % IV SOLN
12.5000 g | INTRAVENOUS | Status: AC
  Administered 2022-11-03: 12.5 g via INTRAVENOUS

## 2022-11-03 MED ORDER — HALOPERIDOL 1 MG PO TABS: 2.0000 mg | ORAL_TABLET | Freq: Four times a day (QID) | ORAL | Status: DC | PRN

## 2022-11-03 NOTE — Progress Notes (Signed)
Speech Language Pathology Treatment: Dysphagia  Patient Details Name: [name] MRN: [id number] DOB: September 19, 1966 Today's Date: 11/03/2022 Time: 9326-7124 SLP Time Calculation (min) (ACUTE ONLY): 15 min  Assessment / Plan / Recommendation Clinical Impression  Patient seen by SLP for skilled treatment focused on dysphagia goals. Patient was awake and alert, receptive to spoon sips of honey thick liquids. He did not exhibit any overt s/s aspiration or penetration, however he did exhibit suspected swallow initiation delay and as recent prior MBS showed silent aspiration recommendation is to continue NPO status with likely repeat MBS to ensure no significant decline in his swallow function.     HPI HPI: 56 year old male with prior history of cardiac arrest with anoxic brain injury who presented with acute hypoxic respiratory failure in the setting of bilateral lower lobe pneumonia requiring endotracheal intubation.  Pt was intubated from 10/25-10/28. MBS 11/1 with recs for dysphagia 1/honey thick liquids. Prior MBS in 2018 recommending Dysphagia 3 solids and honey-thick liquids.      SLP Plan  Continue with current plan of care      Recommendations for follow up therapy are one component of a multi-disciplinary discharge planning process, led by the attending physician.  Recommendations may be updated based on patient status, additional functional criteria and insurance authorization.    Recommendations  Diet recommendations: NPO Medication Administration: Via alternative means                Oral Care Recommendations: Oral care QID;Staff/trained caregiver to provide oral care Follow Up Recommendations: Skilled nursing-short term rehab (<3 hours/day) Assistance recommended at discharge: Frequent or constant Supervision/Assistance SLP Visit Diagnosis: Dysphagia, oropharyngeal phase (R13.12) Plan: Continue with current plan of care           Sonia Baller, MA,  CCC-SLP Speech Therapy

## 2022-11-03 NOTE — Progress Notes (Signed)
Pharmacy Antibiotic Note  Jeffrey Graefe is a 56 y.o. male admitted on 10/21/2022 with pneumonia.  Pharmacy has been consulted for cefepime dosing. CXR (11/3) shows small pleural effusions + bibasilar lung opacities. WBC WNL, afeb (w PRN APAP) Cr stable. Per RN report, likely aspiration with increased secretions> now improved  Day 4/5 cefepime    Plan: Cefepime 2g IV q8h continue for 1 more day     Height: 5\' 8"  (172.7 cm) Weight: 85.2 kg (187 lb 13.3 oz) IBW/kg (Calculated) : 68.4  Temp (24hrs), Avg:97.7 F (36.5 C), Min:97.6 F (36.4 C), Max:97.8 F (36.6 C)  Recent Labs  Lab 10/28/22 0223 10/29/22 0259 10/30/22 0828 10/31/22 0405 10/31/22 1400 10/31/22 1611 11/01/22 0546 11/02/22 0347 11/03/22 0040  WBC 6.4 7.1 7.4 5.2  --   --  5.0  --   --   CREATININE 0.70 0.70 0.70 0.74  --   --  0.75 0.76 0.78  LATICACIDVEN  --   --   --   --  0.8 0.6  --   --   --      Estimated Creatinine Clearance: 110.8 mL/min (by C-G formula based on SCr of 0.78 mg/dL).    Allergies  Allergen Reactions   Ativan [Lorazepam] Other (See Comments)    Tolerated 09/2022 admission   Latex Dermatitis and Other (See Comments)    "Allergic," per MAR   Morphine And Related Other (See Comments)    Reaction not listed on MAR   Other Other (See Comments)    Soy Sauce  Reaction not listed on MAR   Penicillins Other (See Comments)    Patient tolerated cephalosporins in past. "Allergic," per MAR.  Has patient had a PCN reaction causing immediate rash, facial/tongue/throat swelling, SOB or lightheadedness with hypotension: unknown Has patient had a PCN reaction causing severe rash involving mucus membranes or skin necrosis: unknown Has patient had a PCN reaction that required hospitalization: no Has patient had a PCN reaction occurring within the last 10 years: yes If all of the above answe   Pyridium [Phenazopyridine Hcl] Other (See Comments)    Reaction not listed on MAR   Soy Allergy Other (See  Comments)    Reaction not listed on MAR   Soybean (Diagnostic) Other (See Comments)    Reaction not listed no MAR    Antimicrobials this admission: Vanc 10/25 >> 10/26; 10/28 >> 11/6 Cefepime 10/26 >>10/27, 11/3>(11/8) Azith 10/26 >>10/27 CTX 10/27>> 10/30  Microbiology results: 10/30 abd Wound: GPC in clusters after I&D 10/28 abd Wound: MRSA 10/26 Resp Cx: nf 10/26 Ucx: negative 10/25 Bcx: Neg   Bonnita Nasuti Pharm.D. CPP, BCPS Clinical Pharmacist [phone] 11/03/2022 2:35 PM

## 2022-11-03 NOTE — Progress Notes (Signed)
PROGRESS NOTE    [name]  NLG:[id number] DOB: 11-26-1966 DOA: 10/21/2022 PCP: No primary care provider on file.    Chief Complaint  Patient presents with   Respiratory Arrest    Brief Narrative:   56 year old man admitted from [facility] with significant for Anoxic Brain injury secondary to remote cardiac arrest, PEG dependent(removed), CAD, hypothyroidism, seizure disorder, paroxysmal A. fib not on systemic anticoagulation and adrenal insufficiency  -Patient presents from SNF hypoxic in the 70s, agitated, likely with aspiration event, required intubation and admission to ICU. ETT 10/25-10/28. Found to have large abscess on the R abdominal wall s/p bedside I&D by surgery ,  Patient improved, successfully extubated and transferred to [facility] 10/29/2022.  Patient hospital course was complicated by aspiration pneumonia, difficult IV access, low blood pressure.  Currently he remains altered, remains n.p.o., with recurrent aspiration event, soft blood pressure but upon placing a line there is some discrepancy between cuff and A-line reading.   Assessment & Plan:   Principal Problem:   Respiratory failure (HCC) Active Problems:   Anoxic brain injury (HCC)   Protein calorie malnutrition (HCC)   Cardiomyopathy, ischemic   Quadriplegia (HCC)   Acute hypoxemic respiratory failure (HCC)   Pneumonia of right lower lobe due to infectious organism   Malnutrition of moderate degree   Pressure injury of skin   Difficult intravenous access   Septic shock 2/2 pneumonia HCAP Intra-abdominal abscess -Septic shock present on admission, requiring pressor support -Pathophysiology of sepsis has resolved, off pressor. -Continue with midodrine for soft blood pressure. -Adrenal insufficiency ruled out by normal cosyntropin test. -Treated 10 days with IV vancomycin for intra-abdominal abscess, management per general surgery. -Currently on cefepime to finish 5 days for aspiration of  pneumonia. -Blood pressure has improved with scheduled albumin.  Hypotension  -Patient persistently hypotensive, PCCM  consulted, he does not appear to be an active sepsis currently, A line was placed, and there is a big discrepancy between actual blood pressure reading via A-line  VS blood pressure cuff check at least of 20 mmHg, so should be able to tolerate systolic blood pressure upper 08X or 80s as it does actually reflect appropriate blood pressure in the 90s or 100.   Acute respiratory failure with hypoxia (HCC) Respiratory acidosis, resolved -Currently extubated, requiring nasal cannula. -hypertonic 3% nebulized solution -Start guaifenesin per tube to help loosen secretions -Continue O2 supplementation, wean for O2 sat >92% -Discontinue scopolamine patch  Hx of anoxic brain injury, TBI Seizure disorder Acute encephalopathy/hospital delirium -Continue home Haldol per tube -Continue valproic acid per tube -Continue BID Klonopin and PRN ativan for agitation -Patient appears to be with worsening mentation during hospital stay, in the setting of delirium, multiple comorbidities, remains n.p.o. followed closely by SLP.   Dysphagia  - due to above, failed swallow evaluation, he remains on tube feed, pulled his core track overnight, NGT to be inserted today to give meds and start tube feed in the setting of hypoglycemia and core track to be reinserted tomorrow.   Hypoglycemia -Remains on D10 LR, it had to be decreased overnight as he pulled his core track tube and tube feeds were stopped .   R abdominal wall abscess Hx of abdominal hernia CT A/P showed enlarging 10.7 x 8.6 x 7.5 cm abnormal fluid collection concerning for possible abscess.  Successful bedside I&D by CCS on 10/30.  Mild tenderness around the incision site but no complications -CCS following, appreciate recs -Continue vancomycin, de-escalate based on wound  culture and sensitivities -Dressing changes per CCS -Use IV  Protonix for evidence of gastritis on imaging.   Parox A-fib, resolved Sinus brady Remains in NSR but has had intermittent bradycardia  CHADVAs score of 2. No electrolyte abnormalities. TSH within normal limits. Patient is at a significant risk of falls at baseline.  Patient's risk of bleeding outweighs his risk of stroke so we will hold off on long-term anticoagulation.  HR has been stable in the 60s. -Telemetry -Lovenox for DVT prophylaxis -Trend and replete electrolytes   Cardiomyopathy, ischemic Last TTE 09/2017 showed EF 60 to 65% and akinesis of the basal inferior myocardium. Repeat TTE showed EF 50 to 55%, with global hypokinesis but no valvular abnormalities. -Monitor and replete electrolytes -Strict I&O's   Hypothyroidism Repeat stable at TSH 5.5. -Continue Synthroid to 225 mcg daily   Protein calorie malnutrition ([place]) -Cortrak placed on 10/30.  -Continue TF for now   Difficult IV access -IV team unable to place a PICC line, right femoral placed by PCCM 11/3, IR consulted again for an access , right femoral  central line need  be discontinued after that .  Goals of care -Discussed with sister, discussed multiple comorbidities, uncertain prognosis, will get palliative medicine involved in case he has no improvement we can talk about alternative including hospice.   Body mass index is 28.56 kg/m.  DVT prophylaxis: Lovenox Code Status: (Partial) Family Communication: None at bedside, discussed  with his sister/legal guardian by phone 11/5 Status is: Inpatient    Consultants:  PCCM General surgery Palliative IR  Consult line placed 10/30/2022, as IV team were unable to place PICC line.  Subjective:  Patient himself cannot provide any complaints, he pulled his core track yesterday   Objective: Vitals:   11/02/22 2340 11/03/22 0414 11/03/22 0759 11/03/22 1113  BP: (!) 125/92 (!) 118/99 103/72 108/71  Pulse: (!) 58 64 (!) 50 62  Resp: 18 20 17 16   Temp:  97.6 F (36.4 C) 97.7 F (36.5 C) 97.6 F (36.4 C) 97.6 F (36.4 C)  TempSrc: Oral Oral Oral Oral  SpO2: 99% 100% 99% 98%  Weight:  85.2 kg    Height:        Intake/Output Summary (Last 24 hours) at 11/03/2022 1434 Last data filed at 11/03/2022 1200 Gross per 24 hour  Intake 2836.08 ml  Output 4850 ml  Net -2013.92 ml   Filed Weights   11/01/22 0500 11/02/22 0500 11/03/22 0414  Weight: 63.6 kg 69.9 kg 85.2 kg    Examination:   somnolent, open eye, frail, chronically ill-appearing  awake, chronically ill-appearing Decreased air entry at the bases Regular, no rubs or gallop Abdomen bandage in the right abdomen, previous PEG site with minimal discharge  Trace anasarca  A line 11/5>>11/6 Right femoral central line 11/3  Data Reviewed: I have personally reviewed following labs and imaging studies  CBC: Recent Labs  Lab 10/28/22 0223 10/29/22 0259 10/30/22 0828 10/31/22 0405 11/01/22 0546  WBC 6.4 7.1 7.4 5.2 5.0  HGB 10.3* 10.7* 9.4* 9.4* 9.2*  HCT 30.9* 33.0* 28.8* 30.1* 28.7*  MCV 87.5 88.7 88.3 90.9 90.0  PLT 198 226 PLATELET CLUMPS NOTED ON SMEAR, COUNT APPEARS ADEQUATE 223 295    Basic Metabolic Panel: Recent Labs  Lab 10/30/22 0828 10/30/22 1254 10/31/22 0405 11/01/22 0546 11/02/22 0347 11/03/22 0040  NA 135  --  135 138 134* 135  K 4.6  --  4.0 4.2 3.9 4.4  CL 100  --  102 105  103 102  CO2 28  --  29 28 26 26   GLUCOSE 106* 69* 135* 67* 134* 76  BUN 11  --  11 11 13 17   CREATININE 0.70  --  0.74 0.75 0.76 0.78  CALCIUM 8.0*  --  8.0* 8.5* 8.0* 8.5*    GFR: Estimated Creatinine Clearance: 110.8 mL/min (by C-G formula based on SCr of 0.78 mg/dL).  Liver Function Tests: Recent Labs  Lab 10/28/22 0223  AST 42*  ALT 16  ALKPHOS 50  BILITOT 0.5  PROT 5.0*  ALBUMIN 1.6*    CBG: Recent Labs  Lab 11/03/22 0759 11/03/22 0940 11/03/22 1107 11/03/22 1224 11/03/22 1357  GLUCAP 91 82 95 76 109*     Recent Results (from the past 240  hour(s))  Aerobic Culture w Gram Stain (superficial specimen)     Status: None   Collection Time: 10/26/22  1:54 PM   Specimen: Wound  Result Value Ref Range Status   Specimen Description WOUND ABDOMEN  Final   Special Requests NONE  Final   Gram Stain   Final    RARE WBC PRESENT,BOTH PMN AND MONONUCLEAR RARE GRAM POSITIVE COCCI IN CLUSTERS    Culture   Final    RARE STAPHYLOCOCCUS AUREUS SUSCEPTIBILITIES PERFORMED ON PREVIOUS CULTURE WITHIN THE LAST 5 DAYS. Performed at [facility], [address] [place], [address] [place]    Report Status 10/29/2022 FINAL  Final         Radiology Studies: DG Abd 1 View  Result Date: 11/03/2022 CLINICAL DATA:  Nasogastric tube placement EXAM: ABDOMEN - 1 VIEW COMPARISON:  10/30/2022 FINDINGS: Nasogastric tube enters the stomach with its tip in the region of the mid body. IMPRESSION: Nasogastric tube tip in the mid body of the stomach. Electronically Signed   By: 13/06/2022 M.D.   On: 11/03/2022 13:09        Scheduled Meds:  Chlorhexidine Gluconate Cloth  6 each Topical Daily   clonazePAM  0.5 mg Per Tube BID   enoxaparin (LOVENOX) injection  40 mg Subcutaneous Q24H   feeding supplement (PROSource TF20)  60 mL Per Tube Daily   guaiFENesin  5 mL Per Tube Q4H   haloperidol  5 mg Per Tube QPM   levothyroxine  225 mcg Per Tube Q0600   midodrine  10 mg Per Tube Q8H   pantoprazole (PROTONIX) IV  40 mg Intravenous Q12H   sertraline  75 mg Per Tube Daily   sodium chloride flush  10-40 mL Intracatheter Q12H   valproic acid  500 mg Per Tube TID   Continuous Infusions:  sodium chloride Stopped (11/03/22 0511)   ceFEPime (MAXIPIME) IV 2 g (11/03/22 1025)   dextrose 100 mL/hr at 11/03/22 1339   feeding supplement (KATE FARMS STANDARD 1.4) 325 mL (11/03/22 1400)   lactated ringers 50 mL/hr at 11/03/22 0702     LOS: 13 days        13/07/23, MD [facility]   To contact the attending provider between 7A-7P or  the covering provider during after hours 7P-7A, please log into the web site [email or web address] and access using [place] password for that web site. If you do not have the password, please call the hospital operator.  11/03/2022, 2:34 PM

## 2022-11-03 NOTE — Progress Notes (Signed)
Patient ID: [name], male   DOB: December 12, 1966, 56 y.o.   MRN: [id number]    Progress Note from the Palliative Medicine Team at [facility]   Patient Name: [name]        Date: 11/03/2022 DOB: 08-29-1966  Age: 56 y.o. MRN#: [id number] Attending Physician: Albertine Patricia, MD Primary Care Physician: No primary care provider on file. Admit Date: 10/21/2022   Medical records reviewed   56 y.o. male  admitted on 10/21/2022   from [facility] for hypoxia and unresponsiveness.     Admitted for treatment stabilization.   Work up significant for respiratory failure in the setting of bilateral lower lobe pneumonia.  Intubated.  Found to have large abscesses on right abdominal wall   Chronic SNF resident due to demential and anoxic brain injury.  Patient is bed/wheelchair bound at baseline     Patient does not have capacity to make medical decisions.  Sister is legal guardian     Pending decisions regarding treatment option decision, advanced directive decisions and anticipatory care needs.    This NP assessed patient at the bedside as a follow up for palliative medicine needs and emotional support.  I was able to speak again today  to sister by phone.   Education offered again on the seriousness of the patient's current medical situation specifically as it relates to his overall failure to thrive, hypotension, acute on chronic respiratory failure, dysphagia/NG tube placed after patient pulled out core track and now abdominal wall abscess.  Patient is high risk for continued decompensation secondary to his multiple comorbidities, poor nutritional state/Albumin 1.6.   Created space and opportunity for his sister to explore thoughts and feelings regarding current medical situation.  She leans to a more comfort path stating "I do not want him to suffer anymore than he already has".     Education offered on the difference between aggressive medical intervention path and an  intense comfort path for this patient, at this time, in this situation.  Concept of comfort feeds detailed.    Education offered on hospice benefit; philosophy and eligibility.  Detailed hospice benefits specific to residential hospice for end-of-life care  Education offered regarding health treatment option decisions transition of care options.  Education offered today regarding  the importance of continued conversation with family and their  medical providers regarding overall plan of care and treatment options,  ensuring decisions are within the context of the patients values and GOCs.  Questions and concerns addressed   Discussed with Dr Waldron [facility]  Patient's sister tells me that she will call me again tomorrow with decisions regarding Mr. [name]'s plan of care.    Await call back   75 minute   Wadie Lessen NP  Palliative Medicine Team Team Phone # [phone] Pager [phone]

## 2022-11-03 NOTE — Progress Notes (Signed)
Physical Therapy Treatment Patient Details Name: [name] MRN: [id number] DOB: May 18, 1966 Today's Date: 11/03/2022   History of Present Illness Pt is a 56 y.o. male who presented 10/21/22 with acute hypoxic respiratory failure in the setting of bilateral lower lobe pneumonia. ETT 10/25-10/28. Found to have large abscess on the R abdominal wall and a moderate to large right lumbar hernia that was not incarcerated, may plan for surgery 10/30. PMH: cardiac arrest with anoxic brain injury, Addison disease, afib, dementia, dysphagia, UTIs, HLD, hypothyroidism, seizure    PT Comments    Pt seen for PT tx with co-tx with OT. Pt received in bed, requiring encouragement for participation. Pt requires mod assist +2 for supine>sit but can return to supine with CGA. Anticipate pt can mobilize with less assistance if he is motivated to participate. Pt tolerates sitting EOB with CGA & transfers STS with +2 HHA & takes 1 small step to R before returning to sitting EOB & ultimately supine. PT educated/encouraged pt to attempt ambulation with assistance from therapy staff as this PT anticipates he can. Will continue to follow pt acutely to address balance, strengthening, activity tolerance, & gait with LRAD.    Recommendations for follow up therapy are one component of a multi-disciplinary discharge planning process, led by the attending physician.  Recommendations may be updated based on patient status, additional functional criteria and insurance authorization.  Follow Up Recommendations  Skilled nursing-short term rehab (<3 hours/day) Can patient physically be transported by private vehicle: No   Assistance Recommended at Discharge Frequent or constant Supervision/Assistance  Patient can return home with the following Two people to help with walking and/or transfers;A lot of help with bathing/dressing/bathroom;Assistance with feeding;Assistance with cooking/housework;Direct supervision/assist for  medications management;Direct supervision/assist for financial management;Assist for transportation;Help with stairs or ramp for entrance   Equipment Recommendations  None recommended by PT (TBD in next venue)    Recommendations for Other Services       Precautions / Restrictions Precautions Precautions: Fall Precaution Comments: watch BP and O2 Restrictions Weight Bearing Restrictions: No     Mobility  Bed Mobility Overal bed mobility: Needs Assistance       Supine to sit: Mod assist, +2 for physical assistance, HOB elevated (Pt requires assistance/encouragement to initiate & to fully upright trunk; pt initially attempts to lie back down but willing to sit up with encouragement.) Sit to supine: HOB elevated, Min guard (Pt able to transfer BLE onto bed with extra time.)   General bed mobility comments: Pt is able to scoot to [facility] with bed in trendelenburg position with bed rails & cuing for technique. Pt is able to pull with LUE (insufficent R shoulder ROM) & push with BLE.    Transfers Overall transfer level: Needs assistance Equipment used: 2 person hand held assist Transfers: Sit to/from Stand Sit to Stand: Mod assist, +2 physical assistance, From elevated surface           General transfer comment: Pt is able to transfer STS from EOB & take 1 step to R along EOB before returning to sit 2/2 his own volition despite encouragement to attempt additional step.    Ambulation/Gait                   Stairs             Wheelchair Mobility    Modified Rankin (Stroke Patients Only)       Balance Overall balance assessment: Needs assistance Sitting-balance support: Feet supported, Bilateral upper  extremity supported Sitting balance-Leahy Scale: Fair Sitting balance - Comments: R lateral lean Postural control: Right lateral lean Standing balance support: Bilateral upper extremity supported Standing balance-Leahy Scale: Poor                               Cognition Arousal/Alertness: Lethargic Behavior During Therapy: Flat affect Overall Cognitive Status: Difficult to assess                                 General Comments: Pt nods head yes/no about half the time in response to questions, does state "yes" one time. Follows simple commands inconsistently throughout session with extra time.        Exercises General Exercises - Lower Extremity Straight Leg Raises: AAROM, Strengthening, Both, 5 reps, Supine (Pt with decreased ability to follow cuing for BLE knee extension during exercise. RLE weaker than LLE.) Other Exercises Other Exercises: PT assessed BLE joint ROM & found to be WNL. Other Exercises: Pt participated in AAROM cervical rotation stretch to L as pt's preference is to lie with cervical lateral flexion to R, rotated to R.    General Comments General comments (skin integrity, edema, etc.): BP sitting EOB in RUE 105/69 mmHg MAP 80; upon standing pt c/o feeling "drunk" but unable to assess BP in standing 2/2 short standing time      Pertinent Vitals/Pain Pain Assessment Pain Assessment: Faces Faces Pain Scale: No hurt    Home Living                          Prior Function            PT Goals (current goals can now be found in the care plan section) Acute Rehab PT Goals Patient Stated Goal: does not report a goal PT Goal Formulation: With patient/family Time For Goal Achievement: 11/08/22 Potential to Achieve Goals: Fair Progress towards PT goals: Progressing toward goals    Frequency    Min 2X/week      PT Plan Current plan remains appropriate    Co-evaluation PT/OT/SLP Co-Evaluation/Treatment: Yes Reason for Co-Treatment: Complexity of the patient's impairments (multi-system involvement);Necessary to address cognition/behavior during functional activity;For patient/therapist safety PT goals addressed during session: Mobility/safety with  mobility;Balance;Strengthening/ROM OT goals addressed during session: ADL's and self-care      AM-PAC PT "6 Clicks" Mobility   Outcome Measure  Help needed turning from your back to your side while in a flat bed without using bedrails?: A Lot Help needed moving from lying on your back to sitting on the side of a flat bed without using bedrails?: A Lot Help needed moving to and from a bed to a chair (including a wheelchair)?: Total Help needed standing up from a chair using your arms (e.g., wheelchair or bedside chair)?: Total Help needed to walk in hospital room?: Total Help needed climbing 3-5 steps with a railing? : Total 6 Click Score: 8    End of Session   Activity Tolerance: Patient tolerated treatment well Patient left: in bed;with call bell/phone within reach;with bed alarm set   PT Visit Diagnosis: Unsteadiness on feet (R26.81);Muscle weakness (generalized) (M62.81);Difficulty in walking, not elsewhere classified (R26.2);Other symptoms and signs involving the nervous system [id number])     Time: [id number] PT Time Calculation (min) (ACUTE ONLY): 24 min  Charges:  $Therapeutic Activity: 8-22 mins  Lavone Nian, PT, DPT 11/03/22, 9:03 AM   Waunita Schooner 11/03/2022, 9:01 AM

## 2022-11-03 NOTE — Progress Notes (Signed)
   Patient Status: [facility]  Assessment and Plan: Patient in need of venous access.   Peripherally inserted central catheter vs tunneled central venous catheter placement   ______________________________________________________________________   History of Present Illness: [name] is a 56 y.o. male   Admitted from facility with Anoxic brain injury (cardiac arrest) CAD; Sz; Afib Right abd wall abscess; Hx infected mesh Aspiration event Very difficult access; IV team no longer able to find site. Femoral site placed with PCCM- old now  Requesting PICC or reliable line of some kind for remainder of hospital stay   Allergies and medications reviewed.   Review of Systems: A 12 point ROS discussed and pertinent positives are indicated in the HPI above.  All other systems are negative.  Vital Signs: BP 108/71 (BP Location: Right Arm)   Pulse 62   Temp 97.6 F (36.4 C) (Oral)   Resp 16   Ht 5\' 8"  (1.727 m)   Wt 187 lb 13.3 oz (85.2 kg)   SpO2 98%   BMI 28.56 kg/m   Physical Exam Skin:    General: Skin is warm.  Neurological:     Mental Status: He is disoriented.  Psychiatric:     Comments: Discussed with his sister [name] via phone She consents to procedure      Imaging reviewed.   Labs:  COAGS: Recent Labs    10/21/22 1833  INR 1.1  APTT 43*    BMP: Recent Labs    10/31/22 0405 11/01/22 0546 11/02/22 0347 11/03/22 0040  NA 135 138 134* 135  K 4.0 4.2 3.9 4.4  CL 102 105 103 102  CO2 29 28 26 26   GLUCOSE 135* 67* 134* 76  BUN 11 11 13 17   CALCIUM 8.0* 8.5* 8.0* 8.5*  CREATININE 0.74 0.75 0.76 0.78  GFRNONAA >60 >60 >60 >60   Scheduled for PICC vs tunn central line in IR Sister is aware of procedure beneifts and risks including but not limited to Infection; bleeding; vessel damage Agreeable to proceed  Electronically Signed: Lavonia Drafts, PA-C 11/03/2022, 12:01 PM   I spent a total of 15 minutes in face to face in  clinical consultation, greater than 50% of which was counseling/coordinating care for venous access.

## 2022-11-03 NOTE — Progress Notes (Signed)
Subjective/Chief Complaint: Nonverbal this am   Objective: Vital signs in last 24 hours: Temp:  [97.6 F (36.4 C)-97.8 F (36.6 C)] 97.6 F (36.4 C) (11/07 0759) Pulse Rate:  [48-64] 50 (11/07 0759) Resp:  [16-25] 17 (11/07 0759) BP: (85-125)/(59-99) 103/72 (11/07 0759) SpO2:  [94 %-100 %] 99 % (11/07 0759) Arterial Line BP: (81-118)/(42-64) 108/56 (11/06 1600) Weight:  [85.2 kg] 85.2 kg (11/07 0414) Last BM Date : 10/31/22  Intake/Output from previous day: 11/06 0701 - 11/07 0700 In: 3354.6 [I.V.:2287.9; NG/GT:225; IV Piggyback:841.7] Out: 3600 [Urine:3600] Intake/Output this shift: Total I/O In: 325.2 [I.V.:325.2] Out: 1250 [Urine:1250]  GI abdomen soft wound rlq still with some purulent drainage, I repacked this and drained most of what I could get out, small amt induration, but overall stable.  Lab Results:  Recent Labs    11/01/22 0546  WBC 5.0  HGB 9.2*  HCT 28.7*  PLT 216   BMET Recent Labs    11/02/22 0347 11/03/22 0040  NA 134* 135  K 3.9 4.4  CL 103 102  CO2 26 26  GLUCOSE 134* 76  BUN 13 17  CREATININE 0.76 0.78  CALCIUM 8.0* 8.5*   PT/INR No results for input(s): "LABPROT", "INR" in the last 72 hours. ABG No results for input(s): "PHART", "HCO3" in the last 72 hours.  Invalid input(s): "PCO2", "PO2"  Studies/Results: No results found.  Anti-infectives: Anti-infectives (From admission, onward)    Start     Dose/Rate Route Frequency Ordered Stop   10/30/22 1100  ceFEPIme (MAXIPIME) 2 g in sodium chloride 0.9 % 100 mL IVPB        2 g 200 mL/hr over 30 Minutes Intravenous Every 8 hours 10/30/22 0954     10/29/22 0000  vancomycin (VANCOREADY) IVPB 750 mg/150 mL        750 mg 150 mL/hr over 60 Minutes Intravenous Every 12 hours 10/28/22 1425 11/02/22 1325   10/27/22 1315  vancomycin (VANCOCIN) IVPB 1000 mg/200 mL premix  Status:  Discontinued        1,000 mg 200 mL/hr over 60 Minutes Intravenous Every 12 hours 10/27/22 1224  10/28/22 1425   10/27/22 1230  vancomycin (VANCOCIN) IVPB 1000 mg/200 mL premix  Status:  Discontinued        1,000 mg 200 mL/hr over 60 Minutes Intravenous Every 12 hours 10/27/22 1222 10/27/22 1223   10/24/22 2200  vancomycin (VANCOCIN) IVPB 1000 mg/200 mL premix  Status:  Discontinued        1,000 mg 200 mL/hr over 60 Minutes Intravenous Every 12 hours 10/24/22 1138 10/27/22 1222   10/24/22 1230  vancomycin (VANCOREADY) IVPB 1250 mg/250 mL        1,250 mg 166.7 mL/hr over 90 Minutes Intravenous  Once 10/24/22 1138 10/24/22 1431   10/23/22 1130  cefTRIAXone (ROCEPHIN) 2 g in sodium chloride 0.9 % 100 mL IVPB        2 g 200 mL/hr over 30 Minutes Intravenous Every 24 hours 10/23/22 1041 10/26/22 1324   10/22/22 2200  ceFEPIme (MAXIPIME) 2 g in sodium chloride 0.9 % 100 mL IVPB  Status:  Discontinued        2 g 200 mL/hr over 30 Minutes Intravenous Every 8 hours 10/22/22 1559 10/23/22 1041   10/22/22 0900  vancomycin (VANCOREADY) IVPB 750 mg/150 mL  Status:  Discontinued        750 mg 150 mL/hr over 60 Minutes Intravenous Every 12 hours 10/21/22 2041 10/22/22 1559   10/22/22 0700  ceFEPIme (MAXIPIME) 2 g in sodium chloride 0.9 % 100 mL IVPB  Status:  Discontinued        2 g 200 mL/hr over 30 Minutes Intravenous Every 12 hours 10/21/22 2041 10/22/22 1559   10/21/22 1900  vancomycin (VANCOREADY) IVPB 1750 mg/350 mL        1,750 mg 175 mL/hr over 120 Minutes Intravenous  Once 10/21/22 1854 10/21/22 2125   10/21/22 1900  ceFEPIme (MAXIPIME) 2 g in sodium chloride 0.9 % 100 mL IVPB        2 g 200 mL/hr over 30 Minutes Intravenous  Once 10/21/22 1854 10/21/22 1924   10/21/22 1900  azithromycin (ZITHROMAX) 500 mg in sodium chloride 0.9 % 250 mL IVPB  Status:  Discontinued        500 mg 250 mL/hr over 60 Minutes Intravenous Every 24 hours 10/21/22 1854 10/23/22 1041   10/21/22 1845  vancomycin (VANCOCIN) IVPB 1000 mg/200 mL premix  Status:  Discontinued        1,000 mg 200 mL/hr over 60  Minutes Intravenous  Once 10/21/22 1835 10/21/22 1851   10/21/22 1845  levofloxacin (LEVAQUIN) IVPB 750 mg  Status:  Discontinued        750 mg 100 mL/hr over 90 Minutes Intravenous  Once 10/21/22 1835 10/21/22 1849       Assessment/Plan: Right abdominal wall abscess History of infected mesh - s/p bedside I&D 10/30 - culture: MRSA, on Vancomycin, (also on cefepime for PNA) - needs bid dressing changes -no further surgical needs.  Wound is stable.  Continue dressing changes.  We will sign off at this time.   ID - above FEN - TF VTE - lovenox Foley - none   Right lumbar hernia Acute respiratory failure - extubated 10/28  Bradycardia  Sepsis Pneumonia  H/o adrenal insufficiency Hx anoxic brain injury, TBI    Henreitta Cea, PA-C 11/03/2022

## 2022-11-03 NOTE — Progress Notes (Signed)
Occupational Therapy Treatment Patient Details Name: [name] MRN: [id number] DOB: 1966/03/16 Today's Date: 11/03/2022   History of present illness Pt is a 56 y.o. male who presented 10/21/22 with acute hypoxic respiratory failure in the setting of bilateral lower lobe pneumonia. ETT 10/25-10/28. Found to have large abscess on the R abdominal wall and a moderate to large right lumbar hernia that was not incarcerated, may plan for surgery 10/30. PMH: cardiac arrest with anoxic brain injury, Addison disease, afib, dementia, dysphagia, UTIs, HLD, hypothyroidism, seizure   OT comments  Patient supine in bed, agreeable to OT/PT session with encouragement.  Patient following some simple 1 step commands but remains inconsistent, keeps eyes open throughout session today and engaging more appropriately. He is able to complete bed mobility with mod assist +2, washing face at EOB with min assist and stood at EOB with +2 mod assist.  Fatigues easily but VSS throughout session. Updated goals. Will follow acutely.    Recommendations for follow up therapy are one component of a multi-disciplinary discharge planning process, led by the attending physician.  Recommendations may be updated based on patient status, additional functional criteria and insurance authorization.    Follow Up Recommendations  Skilled nursing-short term rehab (<3 hours/day)    Assistance Recommended at Discharge Frequent or constant Supervision/Assistance  Patient can return home with the following  Two people to help with walking and/or transfers;Two people to help with bathing/dressing/bathroom;Assistance with feeding;Assistance with cooking/housework;Direct supervision/assist for medications management;Assist for transportation;Direct supervision/assist for financial management;Help with stairs or ramp for entrance   Equipment Recommendations  None recommended by OT    Recommendations for Other Services      Precautions /  Restrictions Precautions Precautions: Fall Precaution Comments: watch BP and O2 Restrictions Weight Bearing Restrictions: No       Mobility Bed Mobility Overal bed mobility: Needs Assistance Bed Mobility: Rolling, Sidelying to Sit, Sit to Sidelying Rolling: Mod assist Sidelying to sit: Mod assist, +2 for physical assistance, +2 for safety/equipment     Sit to sidelying: Min assist, +2 for physical assistance, +2 for safety/equipment General bed mobility comments: mod to roll to R side, assist to BLEs towards EOB and trunk support to ascend. returns to bed with guiding support of LEs and trunk.  Pt able to pull self up in bed with cueing for technique but no physical assist.    Transfers Overall transfer level: Needs assistance Equipment used: 2 person hand held assist Transfers: Sit to/from Stand Sit to Stand: Mod assist, +2 physical assistance, +2 safety/equipment           General transfer comment: to power up and steady from EOB     Balance Overall balance assessment: Needs assistance Sitting-balance support: No upper extremity supported, Feet supported Sitting balance-Leahy Scale: Fair Sitting balance - Comments: tends to lean towards R, min guard to min assist at best Postural control: Right lateral lean Standing balance support: Bilateral upper extremity supported Standing balance-Leahy Scale: Poor Standing balance comment: relies on +2 external assist                           ADL either performed or assessed with clinical judgement   ADL Overall ADL's : Needs assistance/impaired     Grooming: Minimal assistance;Sitting;Wash/dry face Grooming Details (indicate cue type and reason): using L hand at EOB with min assist to wash face             Lower Body Dressing:  Total assistance;+2 for physical assistance;Sit to/from stand Lower Body Dressing Details (indicate cue type and reason): requires assist to don socks at EOB, +2 to stand              Functional mobility during ADLs: Moderate assistance;+2 for physical assistance;+2 for safety/equipment;Cueing for safety;Cueing for sequencing      Extremity/Trunk Assessment              Vision   Additional Comments: pt scanning to L and R, following minimal commands- further assessment needed   Perception     Praxis      Cognition Arousal/Alertness: Lethargic Behavior During Therapy: Flat affect Overall Cognitive Status: History of cognitive impairments - at baseline                                 General Comments: eyes open and engages minimally but appropriate.  Follows simple commands with increased time, but inconsistently.        Exercises      Shoulder Instructions       General Comments BP sitting EOB in RUE 105/69 mmHg MAP 80; upon standing pt c/o feeling "drunk" but unable to assess BP in standing 2/2 short standing time    Pertinent Vitals/ Pain       Pain Assessment Pain Assessment: Faces Faces Pain Scale: Hurts a little bit Pain Location: R shoulder? Leg? generalized Pain Descriptors / Indicators: Grimacing Pain Intervention(s): Limited activity within patient's tolerance, Monitored during session, Repositioned  Home Living                                          Prior Functioning/Environment              Frequency  Min 2X/week        Progress Toward Goals  OT Goals(current goals can now be found in the care plan section)  Progress towards OT goals: Progressing toward goals  Acute Rehab OT Goals Patient Stated Goal: none stated OT Goal Formulation: Patient unable to participate in goal setting Time For Goal Achievement: 11/17/22 Potential to Achieve Goals: Fair ADL Goals Pt Will Perform Grooming: with min guard assist;sitting Pt Will Perform Upper Body Bathing: sitting;with mod assist Pt Will Transfer to Toilet: with mod assist;with +2 assist;bedside commode Additional ADL Goal #1: Pt  will follow 1 step commands with increased time and 90% accuracy. Additional ADL Goal #2: Pt will complete bed mobility with min assist to decrease burden of care.  Plan Discharge plan remains appropriate;Frequency remains appropriate    Co-evaluation    PT/OT/SLP Co-Evaluation/Treatment: Yes Reason for Co-Treatment: Complexity of the patient's impairments (multi-system involvement);Necessary to address cognition/behavior during functional activity;For patient/therapist safety PT goals addressed during session: Mobility/safety with mobility;Balance;Strengthening/ROM OT goals addressed during session: ADL's and self-care      AM-PAC OT "6 Clicks" Daily Activity     Outcome Measure   Help from another person eating meals?: Total Help from another person taking care of personal grooming?: A Lot Help from another person toileting, which includes using toliet, bedpan, or urinal?: Total Help from another person bathing (including washing, rinsing, drying)?: Total Help from another person to put on and taking off regular upper body clothing?: Total Help from another person to put on and taking off regular lower body clothing?: Total 6 Click Score:  7    End of Session    OT Visit Diagnosis: Other abnormalities of gait and mobility (R26.89);Muscle weakness (generalized) (M62.81);Pain;Other symptoms and signs involving cognitive function Pain - Right/Left: Right Pain - part of body: Shoulder   Activity Tolerance Patient tolerated treatment well   Patient Left in bed;with call bell/phone within reach;with bed alarm set   Nurse Communication Mobility status        Time: 0092-3300 OT Time Calculation (min): 17 min  Charges: OT General Charges $OT Visit: 1 Visit OT Treatments $Self Care/Home Management : 8-22 mins  [name], OT Acute Rehabilitation Services Office [phone]   Chancy Milroy 11/03/2022, 9:06 AM

## 2022-11-03 NOTE — Care Management Important Message (Signed)
Important Message  Patient Details  Name: [name] MRN: [id number] Date of Birth: September 05, 1966   Medicare Important Message Given:  Yes     Shelda Altes 11/03/2022, 9:30 AM

## 2022-11-04 ENCOUNTER — Inpatient Hospital Stay (HOSPITAL_COMMUNITY): Payer: 59

## 2022-11-04 DIAGNOSIS — J189 Pneumonia, unspecified organism: Secondary | ICD-10-CM | POA: Diagnosis not present

## 2022-11-04 DIAGNOSIS — J9601 Acute respiratory failure with hypoxia: Secondary | ICD-10-CM | POA: Diagnosis not present

## 2022-11-04 LAB — GLUCOSE, CAPILLARY
Glucose-Capillary: 110 mg/dL — ABNORMAL HIGH (ref 70–99)
Glucose-Capillary: 72 mg/dL (ref 70–99)
Glucose-Capillary: 71 mg/dL (ref 70–99)
Glucose-Capillary: 63 mg/dL — ABNORMAL LOW (ref 70–99)
Glucose-Capillary: 71 mg/dL (ref 70–99)
Glucose-Capillary: 56 mg/dL — ABNORMAL LOW (ref 70–99)
Glucose-Capillary: 66 mg/dL — ABNORMAL LOW (ref 70–99)

## 2022-11-04 LAB — BASIC METABOLIC PANEL
Anion gap: 7 (ref 5–15)
BUN: 13 mg/dL (ref 6–20)
CO2: 25 mmol/L (ref 22–32)
Calcium: 8.5 mg/dL — ABNORMAL LOW (ref 8.9–10.3)
Chloride: 104 mmol/L (ref 98–111)
Creatinine, Ser: 0.69 mg/dL (ref 0.61–1.24)
GFR, Estimated: >60 mL/min (ref >60)
Glucose, Bld: 91 mg/dL (ref 70–99)
Potassium: 3.9 mmol/L (ref 3.5–5.1)
Sodium: 136 mmol/L (ref 135–145)

## 2022-11-04 MED ORDER — LIDOCAINE HCL 1 % IJ SOLN
INTRAMUSCULAR | Status: AC
  Filled 2022-11-04: qty 20

## 2022-11-04 NOTE — Progress Notes (Signed)
[facility]  PROGRESS NOTE  [name] IRC:[id number] DOB: 1966-08-23 DOA: 10/21/2022 PCP: No primary care provider on file.   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  ETT from October 25 to October 28.  Found to have large abscess of right abdominal wall s/p bedside incision and drainage by general surgery.  Patient improved, successfully extubated and transferred to [facility] service on 10/29/2022. Hospital course was complicated by aspiration pneumonia, difficult IV access and low blood pressure.    Subjective   Patient seen and examined, nonverbal.  NG tube feeding in place.   Assessment/Plan:    Septic shock secondary to pneumonia/intra-abdominal abscess -Resolved, patient is off pressor support -Continue with midodrine for soft BP -Adrenal insufficiency ruled out from normal cosyntropin stimulation test -Patient treated with IV vancomycin for 10 days for intra-abdominal abscess, management per general surgery -Currently on cefepime, to complete 5 days of treatment for aspiration pneumonia  Hypotension -Persistently hypotensive -Currently on midodrine 10 mg p.o. every 8 hours  A line was placed, and there is a big discrepancy between actual blood pressure reading via A-line  VS blood pressure cuff check at least of 20 mmHg, so should be able to tolerate systolic blood pressure upper 51W or 80s as it does actually reflect appropriate blood pressure in the 90s or 100.   Acute respiratory with hypoxemia/respiratory acidosis -Resolved, currently extubated -Continue  guaifenesin per tube to loosen secretions -Scopolamine patch has been discontinued  History of anoxic brain injury, TBI -Seizure disorder/encephalopathy/hospital delirium -Continue home Haldol per tube -Continue valproic acid per tube -Continue Klonopin twice daily  Dysphagia -Patient had core track tube which was pulled by patient -NG tube has been reinserted for tube feeding -Underwent MBS today, started on dysphagia 2 diet per speech therapy -If he tolerates diet, will remove NG tube  Hypoglycemia -Patient currently on D10 LR -CBG on lower side -Home Lee CBG will improve once patient started eating  Right abdominal wall abscess -History of abdominal hernia -CT A/P showed enlarging 10.7 x 8.6 x 7.5 cm abnormal fluid collection concerning for possible abscess.  Successful bedside I&D by CCS on 10/30.  Mild tenderness around the incision site but no complications -CCS following, appreciate recs -Continue vancomycin, de-escalate based on wound culture and sensitivities -Dressing changes per CCS -Use IV Protonix for evidence of gastritis on imaging.  Paroxysmal atrial fibrillation -Resolved -Remains in normal sinus rhythm but has intermittent bradycardia CHADVAs score of 2. No electrolyte abnormalities. TSH within normal limits. Patient is at a significant risk of falls at baseline.  Patient's risk of bleeding outweighs his risk of stroke so we will hold off on long-term anticoagulation.  HR has been stable in the 60s. -Telemetry -Lovenox for DVT prophylaxis -Trend and replete electrolytes  Cardiomyopathy, ischemic Last TTE 09/2017 showed EF 60 to 65% and akinesis of the basal inferior myocardium. Repeat TTE showed EF 50 to 55%, with global hypokinesis but no valvular abnormalities. -Monitor and replete electrolytes -Strict I&O's   Hypothyroidism Repeat stable at TSH 5.5. -Continue Synthroid to 225 mcg daily   Protein calorie malnutrition (HCC) -Cortrak placed on 10/30.   -Continue TF for now  Medications     Chlorhexidine Gluconate Cloth  6 each Topical Daily   clonazePAM  0.5 mg Per Tube BID   enoxaparin (LOVENOX) injection  40 mg Subcutaneous Q24H   feeding supplement (PROSource TF20)  60 mL Per Tube Daily   guaiFENesin  5 mL Per Tube Q4H   haloperidol  5 mg Per Tube QPM   levothyroxine  225 mcg Per Tube Q0600   lidocaine       midodrine  10 mg Per Tube Q8H   pantoprazole (PROTONIX) IV  40 mg Intravenous Q12H   sertraline  75 mg Per Tube Daily   sodium chloride flush  10-40 mL Intracatheter Q12H   valproic acid  500 mg Per Tube TID     Data Reviewed:   CBG:  Recent Labs  Lab 11/03/22 2053 11/04/22 0124 11/04/22 0401 11/04/22 0910 11/04/22 1203  GLUCAP 110* 72 71 63* 71    SpO2: 98 % O2 Flow Rate (L/min): 2 L/min FiO2 (%): 30 %    Vitals:   11/03/22 1926 11/04/22 0404 11/04/22 0808 11/04/22 1154  BP: 105/64 104/70 118/70 (!) 89/56  Pulse: (!) 56 60 (!) 53 64  Resp: 12 20 20 18   Temp: 97.6 F (36.4 C) 97.6 F (36.4 C) 97.6 F (36.4 C) (!) 97.5 F (36.4 C)  TempSrc: Oral Oral Oral Oral  SpO2: 99% 100% 98% 98%  Weight:  83.3 kg    Height:          Data Reviewed:  Basic Metabolic Panel: Recent Labs  Lab 10/31/22 0405 11/01/22 0546 11/02/22 0347 11/03/22 0040 11/04/22 0500  NA 135 138 134* 135 136  K 4.0 4.2 3.9 4.4 3.9  CL 102 105 103 102 104  CO2 29 28 26 26 25   GLUCOSE 135* 67* 134* 76 91  BUN 11 11 13 17 13   CREATININE 0.74 0.75 0.76 0.78 0.69  CALCIUM 8.0* 8.5* 8.0* 8.5* 8.5*    CBC: Recent Labs  Lab 10/29/22 0259 10/30/22 0828 10/31/22 0405 11/01/22 0546  WBC 7.1 7.4 5.2 5.0  HGB 10.7* 9.4* 9.4* 9.2*  HCT 33.0* 28.8* 30.1* 28.7*  MCV 88.7 88.3 90.9 90.0  PLT 226 PLATELET CLUMPS NOTED ON SMEAR, COUNT APPEARS ADEQUATE 223 216    LFT No results for input(s): "AST", "ALT", "ALKPHOS", "BILITOT", "PROT", "ALBUMIN" in the last 168 hours.   Antibiotics: Anti-infectives (From admission,  onward)    Start     Dose/Rate Route Frequency Ordered Stop   10/30/22 1100  ceFEPIme (MAXIPIME) 2 g in sodium chloride 0.9 % 100 mL IVPB        2 g 200 mL/hr over 30 Minutes Intravenous Every 8 hours 10/30/22 0954 11/04/22 2359   10/29/22 0000  vancomycin (VANCOREADY) IVPB 750 mg/150 mL        750 mg 150 mL/hr over 60 Minutes Intravenous Every 12 hours 10/28/22 1425 11/02/22 1325   10/27/22 1315  vancomycin (VANCOCIN) IVPB 1000 mg/200 mL premix  Status:  Discontinued        1,000 mg 200 mL/hr over 60 Minutes Intravenous Every 12 hours 10/27/22 1224 10/28/22 1425   10/27/22 1230  vancomycin (VANCOCIN) IVPB 1000 mg/200 mL premix  Status:  Discontinued        1,000 mg 200 mL/hr over 60 Minutes Intravenous Every 12 hours 10/27/22 1222 10/27/22 1223   10/24/22 2200  vancomycin (VANCOCIN) IVPB 1000 mg/200 mL premix  Status:  Discontinued        1,000 mg 200  mL/hr over 60 Minutes Intravenous Every 12 hours 10/24/22 1138 10/27/22 1222   10/24/22 1230  vancomycin (VANCOREADY) IVPB 1250 mg/250 mL        1,250 mg 166.7 mL/hr over 90 Minutes Intravenous  Once 10/24/22 1138 10/24/22 1431   10/23/22 1130  cefTRIAXone (ROCEPHIN) 2 g in sodium chloride 0.9 % 100 mL IVPB        2 g 200 mL/hr over 30 Minutes Intravenous Every 24 hours 10/23/22 1041 10/26/22 1324   10/22/22 2200  ceFEPIme (MAXIPIME) 2 g in sodium chloride 0.9 % 100 mL IVPB  Status:  Discontinued        2 g 200 mL/hr over 30 Minutes Intravenous Every 8 hours 10/22/22 1559 10/23/22 1041   10/22/22 0900  vancomycin (VANCOREADY) IVPB 750 mg/150 mL  Status:  Discontinued        750 mg 150 mL/hr over 60 Minutes Intravenous Every 12 hours 10/21/22 2041 10/22/22 1559   10/22/22 0700  ceFEPIme (MAXIPIME) 2 g in sodium chloride 0.9 % 100 mL IVPB  Status:  Discontinued        2 g 200 mL/hr over 30 Minutes Intravenous Every 12 hours 10/21/22 2041 10/22/22 1559   10/21/22 1900  vancomycin (VANCOREADY) IVPB 1750 mg/350 mL        1,750 mg 175  mL/hr over 120 Minutes Intravenous  Once 10/21/22 1854 10/21/22 2125   10/21/22 1900  ceFEPIme (MAXIPIME) 2 g in sodium chloride 0.9 % 100 mL IVPB        2 g 200 mL/hr over 30 Minutes Intravenous  Once 10/21/22 1854 10/21/22 1924   10/21/22 1900  azithromycin (ZITHROMAX) 500 mg in sodium chloride 0.9 % 250 mL IVPB  Status:  Discontinued        500 mg 250 mL/hr over 60 Minutes Intravenous Every 24 hours 10/21/22 1854 10/23/22 1041   10/21/22 1845  vancomycin (VANCOCIN) IVPB 1000 mg/200 mL premix  Status:  Discontinued        1,000 mg 200 mL/hr over 60 Minutes Intravenous  Once 10/21/22 1835 10/21/22 1851   10/21/22 1845  levofloxacin (LEVAQUIN) IVPB 750 mg  Status:  Discontinued        750 mg 100 mL/hr over 90 Minutes Intravenous  Once 10/21/22 1835 10/21/22 1849        DVT prophylaxis: Lovenox  Code Status: Partial code with no CPR, no defibrillation or cardioversion.  Yes to mechanical ventilation and intubation  Family Communication: No family at bedside   CONSULTS    Objective    Physical Examination:   General: Appears in no acute distress Cardiovascular: S1-S2, regular, no murmur auscultated Respiratory: Clear to auscultation bilaterally Abdomen: Abdomen is soft, nontender, no organomegaly Extremities: No edema in the lower extremities Neurologic: Alert, nonverbal, not following commands   Status is: Inpatient:      Pressure Injury 10/22/22 Buttocks Stage 1 -  Intact skin with non-blanchable redness of a localized area usually over a bony prominence. (Active)  10/22/22 0030  Location: Buttocks  Location Orientation:   Staging: Stage 1 -  Intact skin with non-blanchable redness of a localized area usually over a bony prominence.  Wound Description (Comments):   Present on Admission: Yes        [name] S Daemion Mcniel   [facility] If 7PM-7AM, please contact night-coverage at [email or web address], Office  [phone]   11/04/2022, 5:14 PM  LOS: 14 days

## 2022-11-04 NOTE — Progress Notes (Signed)
Modified Barium Swallow Progress Note  Patient Details  Name: [name] MRN: [id number] Date of Birth: 1966-09-15  Today's Date: 11/04/2022  Modified Barium Swallow completed.  Full report located under Chart Review in the Imaging Section.  Brief recommendations include the following:  Clinical Impression  Patient continues to present with oral and pharyngeal phase dysphagia which did not appear significantly changed from MBS last week. During pharyngeal phase he quickly transits liquids such that they start to prematurely spill into vallecular. Mastication is delayed as well as anterior to posterior transit of puree solids and mechanical soft solids but with full clearance from oral cavity after swallow. During pharyngeal phase of swallow, patient's delayed swallow initiation continues to be main factor in his aspiration risk. Swallow initiation was delayed at level of vallecular sinus with puree and mechanical soft solids, however no penetration or aspiration with solids and no significant pharyngeal residuals s/p initial swallow. Patient exhibited swallow initiation delay to primarily vallecular sinus with honey thick liquids but did have instances where head of bolus was entering the pyriform sinus prior to swallow being initiated. He exhibited swallow initiation delays to level of pyriform sinus with thin and nectar thick liquids. It was during these delays in initiation of swallow that penetration and aspiration events occured. Penetration and aspiration was trace to min, but were all silent with patient not exhibiting any sensation even after aspirate was starting to travel down trachea. Aspiration and penetration events were inconsistent and correlated with the length of swallow initiation delay with penetration and/or aspiration events more likely to occur with longer delays before swallow initiated. No penetration or aspiration events observed with thin liquids via spoon sips, but  penetration occured with thin liquids via spoon sips and aspiration occured with cup sips of thin liquids. As patient did not have any penetration or aspiration events with even larger cup sips of honey thick liquids, SLP recommending initiate PO diet of Dys 2 (minced) solids, honey thick liquids.   Swallow Evaluation Recommendations       SLP Diet Recommendations: Honey thick liquids;Dysphagia 2 (Fine chop) solids   Liquid Administration via: Cup;Straw   Medication Administration: Crushed with puree   Supervision: Full supervision/cueing for compensatory strategies;Staff to assist with self feeding   Compensations: Slow rate;Small sips/bites   Postural Changes: Seated upright at 90 degrees   Oral Care Recommendations: Oral care BID;Staff/trained caregiver to provide oral care   Other Recommendations: Clarify dietary restrictions;Order thickener from pharmacy;Prohibited food (jello, ice cream, thin soups);Remove water pitcher;Have oral suction available    Angela Nevin, MA, CCC-SLP Speech Therapy

## 2022-11-04 NOTE — TOC Progression Note (Signed)
Transition of Care [facility]) - Progression Note    Patient Details  Name: [name] MRN: [id number] Date of Birth: Apr 09, 1966  Transition of Care [facility]) CM/SW Contact  Erin Sons, [place] Phone Number: 11/04/2022, 10:52 AM  Clinical Narrative:     CSW from ICU that pt transferred from notified this CSW of call from [place] at [facility], [phone]. CSW called Dahlia Client who explained pt's auth went to peer to peer but deadline was yesterday. CSW explained that pt is not yet medically ready and that he would notify Dahlia Client when there is more clarity on potential DC date.    Expected Discharge Plan: Skilled Nursing Facility Barriers to Discharge: Continued Medical Work up  Expected Discharge Plan and Services Expected Discharge Plan: Skilled Nursing Facility       Living arrangements for the past 2 months: Skilled Nursing Facility                                       Social Determinants of Health (SDOH) Interventions    Readmission Risk Interventions     No data to display

## 2022-11-04 NOTE — Procedures (Signed)
Cortrak  Person Inserting Tube:  [name], Sacred Roa L, RD Tube Type:  Cortrak - 43 inches Tube Size:  10 Tube Location:  Left nare Secured by: Bridle Technique Used to Measure Tube Placement:  Marking at nare/corner of mouth Cortrak Secured At:  71 cm   Cortrak Tube Team Note:  Consult received to place a Cortrak feeding tube.   X-ray is required, abdominal x-ray has been ordered by the Cortrak team. Please confirm tube placement before using the Cortrak tube.   If the tube becomes dislodged please keep the tube and contact the Cortrak team at [email or web address] for replacement.  If after hours and replacement cannot be delayed, place a NG tube and confirm placement with an abdominal x-ray.    Kirby Crigler RD, LDN Clinical Dietitian See Loretha Stapler for contact information.

## 2022-11-04 NOTE — Progress Notes (Signed)
PT Cancellation Note  Patient Details Name: [name] MRN: [id number] DOB: 11-02-1966   Cancelled Treatment:    Reason Eval/Treat Not Completed: Patient at procedure or test/unavailable Off unit at procedure. Will re-attempt as time allows.   Ardith Test A. Dan Humphreys PT, DPT Acute Rehabilitation Services Office [phone]    Viviann Spare 11/04/2022, 2:07 PM

## 2022-11-04 NOTE — Progress Notes (Signed)
Speech Language Pathology Treatment: Dysphagia  Patient Details Name: [name] MRN: [id number] DOB: 08/04/1966 Today's Date: 11/04/2022 Time: 5427-0623 SLP Time Calculation (min) (ACUTE ONLY): 16 min  Assessment / Plan / Recommendation Clinical Impression  Pt seen for ongoing dysphagia management.  Pt awake, alert, pleasant, participative.  Repositioned in bed prior to administration of PO trials.  Pt tolerated puree, soft solid, and regular texture solids with good oral clearance and no clinical s/s of aspiration.  Pt consumed 3 oz of HTL by up.  There was a single instance of cough following swallow.  Pt remains NPO at present with tube feds because of concern for aspiration event per RN (potentially related to N/V of TF per RN). Recommend repeat MBS to re-evaluate swallow function prior to initiation of PO diet.  Recommend pt remain NPO with alternate means of nutrition, hydration, and medication pending results of repeat instrumental assessment.   HPI HPI: 56 year old male with prior history of cardiac arrest with anoxic brain injury who presented with acute hypoxic respiratory failure in the setting of bilateral lower lobe pneumonia requiring endotracheal intubation.  Pt was intubated from 10/25-10/28. MBS 11/1 with recs for dysphagia 1/honey thick liquids. Prior MBS in 2018 recommending Dysphagia 3 solids and honey-thick liquids. Pt with suspected aspiration event possibly related to N/V w/tubefeeds and made NPO.      SLP Plan  MBS      Recommendations for follow up therapy are one component of a multi-disciplinary discharge planning process, led by the attending physician.  Recommendations may be updated based on patient status, additional functional criteria and insurance authorization.    Recommendations  Diet recommendations: NPO Medication Administration: Via alternative means                Oral Care Recommendations: Oral care QID;Staff/trained caregiver to provide  oral care Follow Up Recommendations: Skilled nursing-short term rehab (<3 hours/day) Assistance recommended at discharge: Frequent or constant Supervision/Assistance SLP Visit Diagnosis: Dysphagia, oropharyngeal phase (R13.12) Plan: MBS           Kerrie Pleasure, MA, CCC-SLP Acute Rehabilitation Services Office: [phone] 11/04/2022, 10:06 AM

## 2022-11-04 NOTE — Progress Notes (Signed)
Hypoglycemic Event  CBG:69  Treatment: D50 25 mL (12.5 gm)  Symptoms: None  Follow-up CBG: Time:2053  CBG Result:110  Possible Reasons for Event: Inadequate meal intake  Comments/MD notified protocol    Amo Kuffour, [name]

## 2022-11-04 NOTE — Progress Notes (Signed)
Nutrition Follow-up  DOCUMENTATION CODES:   Non-severe (moderate) malnutrition in context of social or environmental circumstances  INTERVENTION:  Resume tube feeding via Cortrak (tip in distal stomach): [employer] 1.4 @ 55 ml/hr  60 ml Prosource TF20 once/day  Provide 1928 kcal, 101 grams protein, and 950 ml free water If unable to safely advance diet and pt remains dependent upon nutrition support, would recommend consideration of long term nutrition access to meet nutrition needs.  NUTRITION DIAGNOSIS:   Moderate Malnutrition related to social / environmental circumstances (dysphagia, SNF resident, ABI) as evidenced by moderate fat depletion, moderate muscle depletion.  ongoing  GOAL:   Patient will meet greater than or equal to 90% of their needs  Progressing-addressing via TF  MONITOR:   Vent status, Weight trends, Labs, TF tolerance  REASON FOR ASSESSMENT:   Consult Enteral/tube feeding initiation and management  ASSESSMENT:   Pt with hx of ABI s/p cardiac arrest, HLD, GERD, cardiomyopathy, and possible quadriplegia presented to ED from SNF with worsening respiratory status. Found to be septic due to pneumonia. PEG at baseline and A&O x 2.  10/25 - Intubated 10/26 - TF started via OGT 10/28 - Extubated 10/30 - Cortrak placed, bedside I&D of abdominal abscess 10/31 - SLP advanced to DYS3 with honey liquids  11/03- Diet downgraded to NPO d/t recurrent aspiration event  Pt sleeping at time of visit. TF infusing at goal rate. Noted pt self removed Cortrak 11/6. NG tube placed at bedside by nursing yesterday and Cortrak was replaced today-tip confirmed gastric.   ST followed up with pt today. Recommended repeat MBS to re-evaluate swallow function.   Reviewed weights throughout admission. Suspect significant increase in weight between 11/6 and 11/7 is likely attributed to change in units/beds. Will continue to monitor trends.   Medications: synthroid, protonix IV  drips: abx, D10 @ 138ml/hr, LR @50ml /hr  Labs: CBG's 63-110 x24 hours  Diet Order:   Diet Order             Diet NPO time specified  Diet effective now                   EDUCATION NEEDS:   Not appropriate for education at this time  Skin:  Skin Assessment: Reviewed RN Assessment (Stage 1 buttocks)  Last BM:  11/5 - type 6  Height:   Ht Readings from Last 1 Encounters:  10/22/22 5\' 8"  (1.727 m)    Weight:   Wt Readings from Last 1 Encounters:  11/04/22 83.3 kg    Ideal Body Weight:  70 kg  BMI:  Body mass index is 27.92 kg/m.  Estimated Nutritional Needs:   Kcal:  1800-2000 kcal/d  Protein:  85-100g/d  Fluid:  1.8-2L/d  , RDN, LDN Clinical Nutrition

## 2022-11-05 DIAGNOSIS — Z7189 Other specified counseling: Secondary | ICD-10-CM | POA: Diagnosis not present

## 2022-11-05 DIAGNOSIS — G931 Anoxic brain damage, not elsewhere classified: Secondary | ICD-10-CM | POA: Diagnosis not present

## 2022-11-05 DIAGNOSIS — E43 Unspecified severe protein-calorie malnutrition: Secondary | ICD-10-CM | POA: Diagnosis not present

## 2022-11-05 DIAGNOSIS — J9621 Acute and chronic respiratory failure with hypoxia: Secondary | ICD-10-CM | POA: Diagnosis not present

## 2022-11-05 DIAGNOSIS — J9601 Acute respiratory failure with hypoxia: Secondary | ICD-10-CM | POA: Diagnosis not present

## 2022-11-05 DIAGNOSIS — J189 Pneumonia, unspecified organism: Secondary | ICD-10-CM | POA: Diagnosis not present

## 2022-11-05 LAB — COMPREHENSIVE METABOLIC PANEL
ALT: 9 U/L (ref 0–44)
AST: 15 U/L (ref 15–41)
Albumin: 2.5 g/dL — ABNORMAL LOW (ref 3.5–5.0)
Alkaline Phosphatase: 41 U/L (ref 38–126)
Anion gap: 5 (ref 5–15)
BUN: 16 mg/dL (ref 6–20)
CO2: 27 mmol/L (ref 22–32)
Calcium: 8.6 mg/dL — ABNORMAL LOW (ref 8.9–10.3)
Chloride: 105 mmol/L (ref 98–111)
Creatinine, Ser: 0.87 mg/dL (ref 0.61–1.24)
GFR, Estimated: >60 mL/min (ref >60)
Glucose, Bld: 61 mg/dL — ABNORMAL LOW (ref 70–99)
Potassium: 3.9 mmol/L (ref 3.5–5.1)
Sodium: 137 mmol/L (ref 135–145)
Total Bilirubin: 0.3 mg/dL (ref 0.3–1.2)
Total Protein: 5.6 g/dL — ABNORMAL LOW (ref 6.5–8.1)

## 2022-11-05 LAB — GLUCOSE, CAPILLARY
Glucose-Capillary: 124 mg/dL — ABNORMAL HIGH (ref 70–99)
Glucose-Capillary: 48 mg/dL — ABNORMAL LOW (ref 70–99)
Glucose-Capillary: 56 mg/dL — ABNORMAL LOW (ref 70–99)
Glucose-Capillary: 57 mg/dL — ABNORMAL LOW (ref 70–99)
Glucose-Capillary: 61 mg/dL — ABNORMAL LOW (ref 70–99)
Glucose-Capillary: 66 mg/dL — ABNORMAL LOW (ref 70–99)
Glucose-Capillary: 72 mg/dL (ref 70–99)
Glucose-Capillary: 76 mg/dL (ref 70–99)
Glucose-Capillary: 77 mg/dL (ref 70–99)
Glucose-Capillary: 91 mg/dL (ref 70–99)

## 2022-11-05 NOTE — Progress Notes (Signed)
Patient ID: Quinzell Malcomb, male   DOB: 1966/06/24, 56 y.o.   MRN: [id number]    Progress Note from the Palliative Medicine Team at [facility]   Patient Name: [name]        Date: 11/05/2022 DOB: 20-Oct-1966  Age: 56 y.o. MRN#: [id number] Attending Physician: Meredeth Ide, MD Primary Care Physician: No primary care provider on file. Admit Date: 10/21/2022   Medical records reviewed   56 y.o. male  admitted on 10/21/2022 from [facility] for hypoxia and unresponsiveness.     Admitted for treatment stabilization.   Work up significant for respiratory failure in the setting of bilateral lower lobe pneumonia.  Intubated/s/p successful extubation   Found to have large abscesses on right abdominal wall   Chronic SNF resident 2/2 anoxic brain injury.  Patient is bed/wheelchair bound at baseline    Patient does not have capacity to make medical decisions.  Sister is legal guardian   Pending decisions regarding treatment option decision, advanced directive decisions and anticipatory care needs.    This NP assessed patient at the bedside as a follow up for palliative medicine needs and emotional support. Patient is non-verbal, and unable to follow commands.    I was able to speak again today  to sister by phone.   Education offered again on the seriousness of the patient's current medical situation specifically as it relates to his overall failure to thrive, hypotension, acute on chronic respiratory failure, dysphagia/NG tube placed after patient pulled out core track and now abdominal wall abscess.  Patient is high risk for continued decompensation secondary to his multiple comorbidities, poor nutritional state/Albumin 1.6.   Created space and opportunity for his sister to explore thoughts and feelings regarding current medical situation.  She leans to a more comfort path stating "I do not want him to suffer anymore than he already has".     Education offered on the difference  between aggressive medical intervention path and an intense comfort path for this patient, at this time, in this situation.  Concept of comfort feeds detailed.    Education offered on hospice benefit; philosophy and eligibility.  Detailed hospice benefits specific to residential hospice for end-of-life care   Education offered today regarding  the importance of continued conversation with family and their  medical providers regarding overall plan of care and treatment options,  ensuring decisions are within the context of the patients values and GOCs.  Kathie Rhodes Gill/ HPOA tells me she wants to try to get in touch again with his 3 estranged sons before making decision to shift to full comfort path.   Questions and concerns addressed   Discussed with Dr   Sharl Ma  Patient's sister tells me that she will call me with decisions regarding Mr. Homer's plan of care.    Await call back   50  minute   Lorinda Creed NP  Palliative Medicine Team Team Phone # [phone] Pager [phone]

## 2022-11-05 NOTE — Plan of Care (Signed)

## 2022-11-05 NOTE — Progress Notes (Signed)
[facility]  PROGRESS NOTE  [name] BZJ:[id number] DOB: Aug 11, 1966 DOA: 10/21/2022 PCP: No primary care provider on file.   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  ETT from October 25 to October 28.  Found to have large abscess of right abdominal wall s/p bedside incision and drainage by general surgery.  Patient improved, successfully extubated and transferred to [facility] service on 10/29/2022. Hospital course was complicated by aspiration pneumonia, difficult IV access and low blood pressure.    Subjective   Patient seen and examined, lethargic this morning.  CBG dropped last night, had to be put on D10 W last night.  NG tube feeding has been restarted due to poor p.o. intake.   Assessment/Plan:    Septic shock secondary to pneumonia/intra-abdominal abscess -Resolved, patient is off pressor support -Continue with midodrine for soft BP -Adrenal insufficiency ruled out from normal cosyntropin stimulation test -Patient treated with IV vancomycin for 10 days for intra-abdominal abscess, management per general surgery -Currently on cefepime, to complete 5 days of treatment for aspiration pneumonia  Hypotension -Persistently hypotensive -Currently on midodrine 10 mg p.o. every 8 hours  A line was placed, and there is a big discrepancy between actual blood pressure reading via A-line  VS blood pressure cuff check at least of 20 mmHg, so should be able to tolerate systolic blood pressure upper 01B or 80s as it does actually reflect appropriate blood pressure in the 90s  or 100.  -Blood pressure has improved  Acute respiratory with hypoxemia/respiratory acidosis -Resolved, currently extubated -Continue guaifenesin per tube to loosen secretions -Scopolamine patch has been discontinued  History of anoxic brain injury, TBI -Seizure disorder/encephalopathy/hospital delirium -Continue home Haldol per tube -Continue valproic acid per tube -Continue Klonopin twice daily  Dysphagia -Patient had core track tube which was pulled by patient -NG tube has been reinserted for tube feeding -Underwent MBS today, started on dysphagia 2 diet per speech therapy -Unfortunately patient's p.o. intake is inadequate so he has been getting episodes of hypoglycemia -Was started on D10W last night -Continue tube feeds via NG tube  Hypoglycemia -Patient currently on D10 LR -CBG is improving after starting p.o. diet   Right abdominal wall abscess -History of abdominal hernia -CT A/P showed enlarging 10.7 x 8.6 x 7.5 cm abnormal fluid collection concerning for possible abscess.  Successful bedside I&D by CCS on 10/30.  Mild tenderness around the incision site but no complications -CCS following, appreciate recs -Continue vancomycin, de-escalate based on wound culture and sensitivities -Dressing changes per CCS -Use IV Protonix for evidence of gastritis on imaging.  Paroxysmal atrial fibrillation -Resolved -Remains in normal sinus rhythm but has intermittent bradycardia CHADVAs score of 2. No electrolyte abnormalities. TSH within normal limits. Patient is at a significant risk of falls at baseline.  Patient's risk of bleeding outweighs his risk of stroke so we will hold off on long-term anticoagulation.  HR has been stable in the 60s. -Telemetry -Lovenox for DVT prophylaxis -Trend and replete electrolytes  Cardiomyopathy, ischemic Last TTE 09/2017 showed EF 60 to 65% and akinesis of the basal inferior myocardium. Repeat TTE showed EF 50 to 55%, with global hypokinesis  but no valvular abnormalities. -Monitor and replete electrolytes -Strict I&O's   Hypothyroidism Repeat stable at TSH 5.5. -Continue Synthroid  225 mcg daily   Protein calorie malnutrition (HCC) -Cortrak placed on 10/30.  -Continue TF for now   Medications     Chlorhexidine Gluconate Cloth  6 each Topical Daily   clonazePAM  0.5 mg Per Tube BID   enoxaparin (LOVENOX) injection  40 mg Subcutaneous Q24H   feeding supplement (PROSource TF20)  60 mL Per Tube Daily   guaiFENesin  5 mL Per Tube Q4H   haloperidol  5 mg Per Tube QPM   levothyroxine  225 mcg Per Tube Q0600   midodrine  10 mg Per Tube Q8H   pantoprazole (PROTONIX) IV  40 mg Intravenous Q12H   sertraline  75 mg Per Tube Daily   sodium chloride flush  10-40 mL Intracatheter Q12H   valproic acid  500 mg Per Tube TID     Data Reviewed:   CBG:  Recent Labs  Lab 11/04/22 2009 11/05/22 0009 11/05/22 0431 11/05/22 0800 11/05/22 0908  GLUCAP 66* 61* 56* 48* 124*    SpO2: 96 % O2 Flow Rate (L/min): 2 L/min FiO2 (%): 30 %    Vitals:   11/04/22 2011 11/05/22 0008 11/05/22 0436 11/05/22 0756  BP: 112/62 119/71 (!) 129/56 (!) 122/59  Pulse: 67 (!) 57 64 (!) 55  Resp: 15 20 15 17   Temp: 97.7 F (36.5 C) 98 F (36.7 C) 97.9 F (36.6 C) 97.7 F (36.5 C)  TempSrc: Oral Oral Oral Oral  SpO2: 97% 97% 100% 96%  Weight:   83 kg   Height:          Data Reviewed:  Basic Metabolic Panel: Recent Labs  Lab 11/01/22 0546 11/02/22 0347 11/03/22 0040 11/04/22 0500 11/05/22 0500  NA 138 134* 135 136 137  K 4.2 3.9 4.4 3.9 3.9  CL 105 103 102 104 105  CO2 28 26 26 25 27   GLUCOSE 67* 134* 76 91 61*  BUN 11 13 17 13 16   CREATININE 0.75 0.76 0.78 0.69 0.87  CALCIUM 8.5* 8.0* 8.5* 8.5* 8.6*    CBC: Recent Labs  Lab 10/30/22 0828 10/31/22 0405 11/01/22 0546  WBC 7.4 5.2 5.0  HGB 9.4* 9.4* 9.2*  HCT 28.8* 30.1* 28.7*  MCV 88.3 90.9 90.0  PLT PLATELET CLUMPS NOTED ON SMEAR, COUNT APPEARS ADEQUATE 223  216    LFT Recent Labs  Lab 11/05/22 0500  AST 15  ALT 9  ALKPHOS 41  BILITOT 0.3  PROT 5.6*  ALBUMIN 2.5*     Antibiotics: Anti-infectives (From admission, onward)    Start     Dose/Rate Route Frequency Ordered Stop   10/30/22 1100  ceFEPIme (MAXIPIME) 2 g in sodium chloride 0.9 % 100 mL IVPB        2 g 200 mL/hr over 30 Minutes Intravenous Every 8 hours 10/30/22 0954 11/04/22 1813   10/29/22 0000  vancomycin (VANCOREADY) IVPB 750 mg/150 mL        750 mg 150 mL/hr over 60 Minutes Intravenous Every 12 hours 10/28/22 1425 11/02/22 1325   10/27/22 1315  vancomycin (VANCOCIN) IVPB 1000 mg/200 mL premix  Status:  Discontinued        1,000 mg 200 mL/hr over 60 Minutes Intravenous Every 12 hours 10/27/22 1224 10/28/22 1425   10/27/22 1230  vancomycin (VANCOCIN) IVPB 1000 mg/200 mL premix  Status:  Discontinued        1,000 mg 200  mL/hr over 60 Minutes Intravenous Every 12 hours 10/27/22 1222 10/27/22 1223   10/24/22 2200  vancomycin (VANCOCIN) IVPB 1000 mg/200 mL premix  Status:  Discontinued        1,000 mg 200 mL/hr over 60 Minutes Intravenous Every 12 hours 10/24/22 1138 10/27/22 1222   10/24/22 1230  vancomycin (VANCOREADY) IVPB 1250 mg/250 mL        1,250 mg 166.7 mL/hr over 90 Minutes Intravenous  Once 10/24/22 1138 10/24/22 1431   10/23/22 1130  cefTRIAXone (ROCEPHIN) 2 g in sodium chloride 0.9 % 100 mL IVPB        2 g 200 mL/hr over 30 Minutes Intravenous Every 24 hours 10/23/22 1041 10/26/22 1324   10/22/22 2200  ceFEPIme (MAXIPIME) 2 g in sodium chloride 0.9 % 100 mL IVPB  Status:  Discontinued        2 g 200 mL/hr over 30 Minutes Intravenous Every 8 hours 10/22/22 1559 10/23/22 1041   10/22/22 0900  vancomycin (VANCOREADY) IVPB 750 mg/150 mL  Status:  Discontinued        750 mg 150 mL/hr over 60 Minutes Intravenous Every 12 hours 10/21/22 2041 10/22/22 1559   10/22/22 0700  ceFEPIme (MAXIPIME) 2 g in sodium chloride 0.9 % 100 mL IVPB  Status:  Discontinued        2  g 200 mL/hr over 30 Minutes Intravenous Every 12 hours 10/21/22 2041 10/22/22 1559   10/21/22 1900  vancomycin (VANCOREADY) IVPB 1750 mg/350 mL        1,750 mg 175 mL/hr over 120 Minutes Intravenous  Once 10/21/22 1854 10/21/22 2125   10/21/22 1900  ceFEPIme (MAXIPIME) 2 g in sodium chloride 0.9 % 100 mL IVPB        2 g 200 mL/hr over 30 Minutes Intravenous  Once 10/21/22 1854 10/21/22 1924   10/21/22 1900  azithromycin (ZITHROMAX) 500 mg in sodium chloride 0.9 % 250 mL IVPB  Status:  Discontinued        500 mg 250 mL/hr over 60 Minutes Intravenous Every 24 hours 10/21/22 1854 10/23/22 1041   10/21/22 1845  vancomycin (VANCOCIN) IVPB 1000 mg/200 mL premix  Status:  Discontinued        1,000 mg 200 mL/hr over 60 Minutes Intravenous  Once 10/21/22 1835 10/21/22 1851   10/21/22 1845  levofloxacin (LEVAQUIN) IVPB 750 mg  Status:  Discontinued        750 mg 100 mL/hr over 90 Minutes Intravenous  Once 10/21/22 1835 10/21/22 1849        DVT prophylaxis: Lovenox  Code Status: Partial code with no CPR, no defibrillation or cardioversion.  Yes to mechanical ventilation and intubation  Family Communication: Spoke to patient's sister on phone, she wants to discuss with patient's sons today and if they all agreed and plan is to transition to comfort measures only.   CONSULTS    Objective    Physical Examination:   Appears lethargic S1-S2, RRR CTA bilaterally Soft, nontender, no organomegaly No edema in the lower extremities   Status is: Inpatient:      Pressure Injury 10/22/22 Buttocks Stage 1 -  Intact skin with non-blanchable redness of a localized area usually over a bony prominence. (Active)  10/22/22 0030  Location: Buttocks  Location Orientation:   Staging: Stage 1 -  Intact skin with non-blanchable redness of a localized area usually over a bony prominence.  Wound Description (Comments):   Present on Admission: Yes  Meredeth Ide   [facility] If  7PM-7AM, please contact night-coverage at [email or web address], Office  [phone]   11/05/2022, 10:50 AM  LOS: 15 days

## 2022-11-05 NOTE — Progress Notes (Signed)
Physical Therapy Treatment Patient Details Name: [name] MRN: [id number] DOB: 04-15-1966 Today's Date: 11/05/2022   History of Present Illness Pt is a 56 y.o. male who presented 10/21/22 with acute hypoxic respiratory failure in the setting of bilateral lower lobe pneumonia. ETT 10/25-10/28. Found to have large abscess on the R abdominal wall and a moderate to large right lumbar hernia that was not incarcerated. S/p bedside I/D of abdominal wall abscess 10/30. Cortrak placed 11/8. PMH: cardiac arrest with anoxic brain injury, Addison disease, afib, dementia, dysphagia, UTIs, HLD, hypothyroidism, seizure    PT Comments    Pt's lethargy continues to limit his mobility progression. However, he was easy to arouse and was participatory, demonstrating good initiation with lateral scooting along EOB. Pt demonstrates deficits in static and dynamic balance, motor planning, and strength that result in him requiring physical assistance for all functional mobility. Completed session with upper and lower extremity AROM exercises and gentle cervical PROM in supine. Will continue to follow acutely. Current recommendations remain appropriate.      Recommendations for follow up therapy are one component of a multi-disciplinary discharge planning process, led by the attending physician.  Recommendations may be updated based on patient status, additional functional criteria and insurance authorization.  Follow Up Recommendations  Skilled nursing-short term rehab (<3 hours/day) Can patient physically be transported by private vehicle: No   Assistance Recommended at Discharge Frequent or constant Supervision/Assistance  Patient can return home with the following Two people to help with walking and/or transfers;A lot of help with bathing/dressing/bathroom;Assistance with feeding;Assistance with cooking/housework;Direct supervision/assist for medications management;Direct supervision/assist for financial  management;Assist for transportation;Help with stairs or ramp for entrance   Equipment Recommendations  None recommended by PT (TBD in next venue)    Recommendations for Other Services       Precautions / Restrictions Precautions Precautions: Fall Precaution Comments: watch BP and O2 Restrictions Weight Bearing Restrictions: No     Mobility  Bed Mobility Overal bed mobility: Needs Assistance Bed Mobility: Supine to Sit, Sit to Supine     Supine to sit: Mod assist, HOB elevated Sit to supine: HOB elevated, Min guard   General bed mobility comments: ModA to elevate trunk and pivot hips to sit L EOB with pt already having left legs partially off bed upon arrival. Min guard assist to watch lines with return to supine.    Transfers Overall transfer level: Needs assistance                Lateral/Scoot Transfers: Min assist General transfer comment: Cues to push up on bed to almost stand to clear buttocks then scoot laterally to L to HOB, minA to lift and direct buttocks, x5 reps. Pt declined to attempt ful stand due to fatigue today    Ambulation/Gait               General Gait Details: declined   Stairs             Wheelchair Mobility    Modified Rankin (Stroke Patients Only)       Balance Overall balance assessment: Needs assistance Sitting-balance support: Feet supported, Bilateral upper extremity supported Sitting balance-Leahy Scale: Poor Sitting balance - Comments: L lateral lean today, min-modA initially but progressed to min guard assist for balance sitting EOB with UE support Postural control: Left lateral lean     Standing balance comment: declined full stand today due to lethargy  Cognition Arousal/Alertness: Lethargic Behavior During Therapy: Flat affect Overall Cognitive Status: Difficult to assess                                 General Comments: Pt nods head yes/no about 3/4  of the time to questions, does state "yes" one time. Follows simple simple commands ~80% of time with extra processing time. Pt lethargic, but easy to arouse.        Exercises General Exercises - Upper Extremity Shoulder Flexion: AROM, Both, 10 reps, Supine (</= 90 degrees bil) Elbow Flexion: AAROM, Both, 10 reps, Supine Elbow Extension: AAROM, Both, 10 reps, Supine General Exercises - Lower Extremity Heel Slides: AROM, Both, 10 reps, Supine Straight Leg Raises: AROM, Both, 10 reps, Supine Other Exercises Other Exercises: PROM into ankle dorsiflexion bil Other Exercises: PROM to R upper traps with L cervical lateral flexion    General Comments        Pertinent Vitals/Pain Pain Assessment Pain Assessment: Faces Faces Pain Scale: No hurt Pain Intervention(s): Monitored during session    Home Living                          Prior Function            PT Goals (current goals can now be found in the care plan section) Acute Rehab PT Goals Patient Stated Goal: does not report a goal PT Goal Formulation: With patient Time For Goal Achievement: 11/08/22 Potential to Achieve Goals: Fair Progress towards PT goals: Progressing toward goals    Frequency    Min 2X/week      PT Plan Current plan remains appropriate    Co-evaluation              AM-PAC PT "6 Clicks" Mobility   Outcome Measure  Help needed turning from your back to your side while in a flat bed without using bedrails?: A Lot Help needed moving from lying on your back to sitting on the side of a flat bed without using bedrails?: A Lot Help needed moving to and from a bed to a chair (including a wheelchair)?: Total Help needed standing up from a chair using your arms (e.g., wheelchair or bedside chair)?: Total Help needed to walk in hospital room?: Total Help needed climbing 3-5 steps with a railing? : Total 6 Click Score: 8    End of Session   Activity Tolerance: Patient tolerated  treatment well Patient left: in bed;with call bell/phone within reach;with bed alarm set   PT Visit Diagnosis: Unsteadiness on feet (R26.81);Muscle weakness (generalized) (M62.81);Difficulty in walking, not elsewhere classified (R26.2);Other symptoms and signs involving the nervous system (R29.898)     Time: 4332-9518 PT Time Calculation (min) (ACUTE ONLY): 18 min  Charges:  $Therapeutic Activity: 8-22 mins                     Raymond Gurney, PT, DPT Acute Rehabilitation Services  Office: [phone]    Jewel Baize 11/05/2022, 3:23 PM

## 2022-11-06 DIAGNOSIS — J189 Pneumonia, unspecified organism: Secondary | ICD-10-CM | POA: Diagnosis not present

## 2022-11-06 DIAGNOSIS — J9601 Acute respiratory failure with hypoxia: Secondary | ICD-10-CM | POA: Diagnosis not present

## 2022-11-06 LAB — GLUCOSE, CAPILLARY
Glucose-Capillary: 92 mg/dL (ref 70–99)
Glucose-Capillary: 82 mg/dL (ref 70–99)
Glucose-Capillary: 74 mg/dL (ref 70–99)
Glucose-Capillary: 71 mg/dL (ref 70–99)
Glucose-Capillary: 83 mg/dL (ref 70–99)

## 2022-11-06 LAB — CBC
HCT: 32.5 % — ABNORMAL LOW (ref 39.0–52.0)
Hemoglobin: 10.6 g/dL — ABNORMAL LOW (ref 13.0–17.0)
MCH: 29.3 pg (ref 26.0–34.0)
MCHC: 32.6 g/dL (ref 30.0–36.0)
MCV: 89.8 fL (ref 80.0–100.0)
Platelets: 186 10*3/uL (ref 150–400)
RBC: 3.62 MIL/uL — ABNORMAL LOW (ref 4.22–5.81)
RDW: 16.8 % — ABNORMAL HIGH (ref 11.5–15.5)
WBC: 6.4 10*3/uL (ref 4.0–10.5)
nRBC: 0 % (ref 0.0–0.2)

## 2022-11-06 MED ORDER — KATE FARMS STANDARD 1.4 PO LIQD
1300.0000 mL | ORAL | Status: DC
  Administered 2022-11-06: 1300 mL
  Administered 2022-11-07: 650 mL
  Filled 2022-11-06: qty 1300

## 2022-11-06 NOTE — Progress Notes (Signed)
[facility]  PROGRESS NOTE  [name] YDX:[id number] DOB: 1966-03-02 DOA: 10/21/2022 PCP: No primary care provider on file.   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  ETT from October 25 to October 28.  Found to have large abscess of right abdominal wall s/p bedside incision and drainage by general surgery.  Patient improved, successfully extubated and transferred to [facility] service on 10/29/2022. Hospital course was complicated by aspiration pneumonia, difficult IV access and low blood pressure.    Subjective   Patient seen and examined, no new complaints.   Assessment/Plan:    Septic shock secondary to pneumonia/intra-abdominal abscess -Resolved, patient is off pressor support -Continue with midodrine for soft BP -Adrenal insufficiency ruled out from normal cosyntropin stimulation test -Patient treated with IV vancomycin for 10 days for intra-abdominal abscess, management per general surgery -Completed 5 days of treatment with cefepime for aspiration pneumonia   Hypotension -Persistently hypotensive -Currently on midodrine 10 mg p.o. every 8 hours  A line was placed, and there is a big discrepancy between actual blood pressure reading via A-line  VS blood pressure cuff check at least of 20 mmHg, so should be able to tolerate systolic blood pressure upper 72C or 80s as it does actually reflect appropriate blood pressure in the 90s or 100.  -Blood pressure has improved  Acute respiratory with hypoxemia/respiratory acidosis -Resolved, currently  extubated -Continue guaifenesin per tube to loosen secretions -Scopolamine patch has been discontinued  History of anoxic brain injury, TBI -Seizure disorder/encephalopathy/hospital delirium -Continue home Haldol per tube -Continue valproic acid per tube -Continue Klonopin twice daily  Dysphagia -Patient had core track tube which was pulled by patient -NG tube has been reinserted for tube feeding -Underwent MBS today, started on dysphagia 2 diet per speech therapy -Unfortunately patient's p.o. intake is inadequate so he has been getting episodes of hypoglycemia -Was started on D10W for persistent hypoglycemia -Continue tube feeds via NG tube  Hypoglycemia -Patient currently on D10 LR -CBG is improving after starting p.o. diet   Right abdominal wall abscess -History of abdominal hernia -CT A/P showed enlarging 10.7 x 8.6 x 7.5 cm abnormal fluid collection concerning for possible abscess.  Successful bedside I&D by [facility] on 10/30.  Mild tenderness around the incision site but no complications -CCS following, appreciate recs -Continue vancomycin, de-escalate based on wound culture and sensitivities -Dressing changes per CCS -Use IV Protonix for evidence of gastritis on imaging.  Paroxysmal atrial fibrillation -Resolved -Remains in normal sinus rhythm but has intermittent bradycardia CHADVAs score of 2. No electrolyte abnormalities. TSH within normal limits. Patient is at a significant risk of falls at baseline.  Patient's risk of bleeding outweighs his risk of stroke so we will hold off on long-term anticoagulation.  HR has been stable in the 60s. -Telemetry -Lovenox for DVT prophylaxis -Trend and replete electrolytes  Cardiomyopathy, ischemic Last TTE 09/2017 showed EF 60 to 65% and akinesis of the basal inferior myocardium. Repeat TTE showed EF 50 to 55%, with global hypokinesis but no valvular abnormalities. -Monitor and replete electrolytes -Strict I&O's    Hypothyroidism Repeat stable at TSH 5.5. -Continue Synthroid  225 mcg daily  Protein calorie malnutrition (HCC) -Cortrak placed on 10/30.  -Continue TF for now  Goals of care -Palliative care has been consulted, also discussed with patient's sister on phone.  She is considering comfort measures after talking to patient's sons  Medications     Chlorhexidine Gluconate Cloth  6 each Topical Daily   clonazePAM  0.5 mg Per Tube BID   enoxaparin (LOVENOX) injection  40 mg Subcutaneous Q24H   feeding supplement (PROSource TF20)  60 mL Per Tube Daily   guaiFENesin  5 mL Per Tube Q4H   haloperidol  5 mg Per Tube QPM   levothyroxine  225 mcg Per Tube Q0600   midodrine  10 mg Per Tube Q8H   pantoprazole (PROTONIX) IV  40 mg Intravenous Q12H   sertraline  75 mg Per Tube Daily   sodium chloride flush  10-40 mL Intracatheter Q12H   valproic acid  500 mg Per Tube TID     Data Reviewed:   CBG:  Recent Labs  Lab 11/05/22 1609 11/05/22 1838 11/05/22 2117 11/05/22 2210 11/06/22 0406  GLUCAP 77 76 66* 91 92    SpO2: 99 % O2 Flow Rate (L/min): 2 L/min FiO2 (%): 30 %    Vitals:   11/05/22 0756 11/06/22 0008 11/06/22 0422 11/06/22 0845  BP: (!) 122/59 (!) 115/55 124/64 118/66  Pulse: (!) 55 71 78 62  Resp: 17 17 19 19   Temp: 97.7 F (36.5 C) 98.7 F (37.1 C) 98.8 F (37.1 C) 98.5 F (36.9 C)  TempSrc: Oral Oral Oral Oral  SpO2: 96% 98% 97% 99%  Weight:   81.3 kg   Height:          Data Reviewed:  Basic Metabolic Panel: Recent Labs  Lab 11/01/22 0546 11/02/22 0347 11/03/22 0040 11/04/22 0500 11/05/22 0500  NA 138 134* 135 136 137  K 4.2 3.9 4.4 3.9 3.9  CL 105 103 102 104 105  CO2 28 26 26 25 27   GLUCOSE 67* 134* 76 91 61*  BUN 11 13 17 13 16   CREATININE 0.75 0.76 0.78 0.69 0.87  CALCIUM 8.5* 8.0* 8.5* 8.5* 8.6*    CBC: Recent Labs  Lab 10/31/22 0405 11/01/22 0546 11/06/22 0457  WBC 5.2 5.0 6.4  HGB 9.4* 9.2* 10.6*  HCT 30.1* 28.7* 32.5*  MCV  90.9 90.0 89.8  PLT 223 216 186    LFT Recent Labs  Lab 11/05/22 0500  AST 15  ALT 9  ALKPHOS 41  BILITOT 0.3  PROT 5.6*  ALBUMIN 2.5*     Antibiotics: Anti-infectives (From admission, onward)    Start     Dose/Rate Route Frequency Ordered Stop   10/30/22 1100  ceFEPIme (MAXIPIME) 2 g in sodium chloride 0.9 % 100 mL IVPB        2 g 200 mL/hr over 30 Minutes Intravenous Every 8 hours 10/30/22 0954 11/04/22 1813   10/29/22 0000  vancomycin (VANCOREADY) IVPB 750 mg/150 mL        750 mg 150 mL/hr over 60 Minutes Intravenous Every 12 hours 10/28/22 1425 11/02/22 1325   10/27/22 1315  vancomycin (VANCOCIN) IVPB 1000 mg/200 mL premix  Status:  Discontinued        1,000 mg 200 mL/hr over 60 Minutes Intravenous Every 12 hours 10/27/22 1224 10/28/22 1425   10/27/22 1230  vancomycin (VANCOCIN) IVPB 1000 mg/200 mL premix  Status:  Discontinued        1,000 mg 200 mL/hr over 60 Minutes Intravenous Every 12 hours  10/27/22 1222 10/27/22 1223   10/24/22 2200  vancomycin (VANCOCIN) IVPB 1000 mg/200 mL premix  Status:  Discontinued        1,000 mg 200 mL/hr over 60 Minutes Intravenous Every 12 hours 10/24/22 1138 10/27/22 1222   10/24/22 1230  vancomycin (VANCOREADY) IVPB 1250 mg/250 mL        1,250 mg 166.7 mL/hr over 90 Minutes Intravenous  Once 10/24/22 1138 10/24/22 1431   10/23/22 1130  cefTRIAXone (ROCEPHIN) 2 g in sodium chloride 0.9 % 100 mL IVPB        2 g 200 mL/hr over 30 Minutes Intravenous Every 24 hours 10/23/22 1041 10/26/22 1324   10/22/22 2200  ceFEPIme (MAXIPIME) 2 g in sodium chloride 0.9 % 100 mL IVPB  Status:  Discontinued        2 g 200 mL/hr over 30 Minutes Intravenous Every 8 hours 10/22/22 1559 10/23/22 1041   10/22/22 0900  vancomycin (VANCOREADY) IVPB 750 mg/150 mL  Status:  Discontinued        750 mg 150 mL/hr over 60 Minutes Intravenous Every 12 hours 10/21/22 2041 10/22/22 1559   10/22/22 0700  ceFEPIme (MAXIPIME) 2 g in sodium chloride 0.9 % 100 mL IVPB   Status:  Discontinued        2 g 200 mL/hr over 30 Minutes Intravenous Every 12 hours 10/21/22 2041 10/22/22 1559   10/21/22 1900  vancomycin (VANCOREADY) IVPB 1750 mg/350 mL        1,750 mg 175 mL/hr over 120 Minutes Intravenous  Once 10/21/22 1854 10/21/22 2125   10/21/22 1900  ceFEPIme (MAXIPIME) 2 g in sodium chloride 0.9 % 100 mL IVPB        2 g 200 mL/hr over 30 Minutes Intravenous  Once 10/21/22 1854 10/21/22 1924   10/21/22 1900  azithromycin (ZITHROMAX) 500 mg in sodium chloride 0.9 % 250 mL IVPB  Status:  Discontinued        500 mg 250 mL/hr over 60 Minutes Intravenous Every 24 hours 10/21/22 1854 10/23/22 1041   10/21/22 1845  vancomycin (VANCOCIN) IVPB 1000 mg/200 mL premix  Status:  Discontinued        1,000 mg 200 mL/hr over 60 Minutes Intravenous  Once 10/21/22 1835 10/21/22 1851   10/21/22 1845  levofloxacin (LEVAQUIN) IVPB 750 mg  Status:  Discontinued        750 mg 100 mL/hr over 90 Minutes Intravenous  Once 10/21/22 1835 10/21/22 1849        DVT prophylaxis: Lovenox  Code Status: Partial code with no CPR, no defibrillation or cardioversion.  Yes to mechanical ventilation and intubation  Family Communication: Spoke to patient's sister on phone, she wants to discuss with patient's sons and if they all agreed and plan is to transition to comfort measures only.   CONSULTS    Objective    Physical Examination:   Appears lethargic S1-S2, regular, no murmur auscultated Clear to auscultation bilaterally Soft, nontender, no organomegaly No edema in the lower extremities   Status is: Inpatient:      Pressure Injury 10/22/22 Buttocks Stage 1 -  Intact skin with non-blanchable redness of a localized area usually over a bony prominence. (Active)  10/22/22 0030  Location: Buttocks  Location Orientation:   Staging: Stage 1 -  Intact skin with non-blanchable redness of a localized area usually over a bony prominence.  Wound Description (Comments):   Present  on Admission: Yes        [place]  [facility] If 7PM-7AM, please contact night-coverage at [email or web address], Office  [phone]   11/06/2022, 9:09 AM  LOS: 16 days

## 2022-11-06 NOTE — Progress Notes (Signed)
Occupational Therapy Treatment Patient Details Name: [name] MRN: [id number] DOB: 07-Sep-1966 Today's Date: 11/06/2022   History of present illness Pt is a 56 y.o. male who presented 10/21/22 with acute hypoxic respiratory failure in the setting of bilateral lower lobe pneumonia. ETT 10/25-10/28. Found to have large abscess on the R abdominal wall and a moderate to large right lumbar hernia that was not incarcerated. S/p bedside I/D of abdominal wall abscess 10/30. Cortrak placed 11/8. PMH: cardiac arrest with anoxic brain injury, Addison disease, afib, dementia, dysphagia, UTIs, HLD, hypothyroidism, seizure   OT comments  Patient asleep upon entry but able to arouse and participate in bed mobility to get to EOB. Patient's lunch arrived during visit and self feeding addressed while seated on EOB. Patient was able to hold bowl of mash potatoes with LUE and feed self with RUE with assistance to load spoon and to bring to mouth. Patient required max assist to eat magic cup due to difficulty loading utensil. Patient would benefit from continued OT to increase grooming and transfers. Acute OT to continue to follow.    Recommendations for follow up therapy are one component of a multi-disciplinary discharge planning process, led by the attending physician.  Recommendations may be updated based on patient status, additional functional criteria and insurance authorization.    Follow Up Recommendations  Skilled nursing-short term rehab (<3 hours/day)    Assistance Recommended at Discharge Frequent or constant Supervision/Assistance  Patient can return home with the following  Two people to help with walking and/or transfers;Two people to help with bathing/dressing/bathroom;Assistance with feeding;Assistance with cooking/housework;Direct supervision/assist for medications management;Assist for transportation;Direct supervision/assist for financial management;Help with stairs or ramp for entrance    Equipment Recommendations  None recommended by OT    Recommendations for Other Services      Precautions / Restrictions Precautions Precautions: Fall Precaution Comments: watch BP and O2 Restrictions Weight Bearing Restrictions: Yes       Mobility Bed Mobility Overal bed mobility: Needs Assistance Bed Mobility: Supine to Sit, Sit to Supine     Supine to sit: Mod assist, HOB elevated Sit to supine: Min guard, HOB elevated   General bed mobility comments: assistance to raise trunk and assistance for LE to return to supine    Transfers Overall transfer level: Needs assistance                 General transfer comment: treatment performed seated on EOB     Balance Overall balance assessment: Needs assistance Sitting-balance support: Feet supported, Bilateral upper extremity supported Sitting balance-Leahy Scale: Poor Sitting balance - Comments: right lateral leaning with mod assist to min assist for support Postural control: Right lateral lean                                 ADL either performed or assessed with clinical judgement   ADL Overall ADL's : Needs assistance/impaired Eating/Feeding: Moderate assistance;Maximal assistance;Sitting Eating/Feeding Details (indicate cue type and reason): performed seated on EOB with patient feeding self with RUE but requiring assistance to load utensil and bring to mouth Grooming: Minimal assistance;Sitting;Wash/dry face Grooming Details (indicate cue type and reason): required hand over hand to initiate                               General ADL Comments: performed grooming and self feeding seated on EOB with mod assit  for balance    Extremity/Trunk Assessment Upper Extremity Assessment RUE Deficits / Details: used RUE to attempt to feed self RUE Coordination: decreased fine motor;decreased gross motor            Vision       Perception     Praxis      Cognition  Arousal/Alertness: Lethargic (more alert once on EOB) Behavior During Therapy: Flat affect Overall Cognitive Status: Difficult to assess                                 General Comments: asleep upon entry but more alert once on EOB        Exercises      Shoulder Instructions       General Comments      Pertinent Vitals/ Pain       Pain Assessment Pain Assessment: Faces Faces Pain Scale: Hurts a little bit Pain Location: generalized while seated on EOB Pain Descriptors / Indicators: Grimacing Pain Intervention(s): Monitored during session  Home Living                                          Prior Functioning/Environment              Frequency  Min 2X/week        Progress Toward Goals  OT Goals(current goals can now be found in the care plan section)  Progress towards OT goals: Progressing toward goals  Acute Rehab OT Goals OT Goal Formulation: Patient unable to participate in goal setting Time For Goal Achievement: 11/17/22 Potential to Achieve Goals: Fair ADL Goals Pt Will Perform Grooming: with min guard assist;sitting Pt Will Perform Upper Body Bathing: sitting;with mod assist Pt Will Transfer to Toilet: with mod assist;with +2 assist;bedside commode Additional ADL Goal #1: Pt will follow 1 step commands with increased time and 90% accuracy. Additional ADL Goal #2: Pt will complete bed mobility with min assist to decrease burden of care.  Plan Discharge plan remains appropriate;Frequency remains appropriate    Co-evaluation                 AM-PAC OT "6 Clicks" Daily Activity     Outcome Measure   Help from another person eating meals?: A Lot Help from another person taking care of personal grooming?: A Lot Help from another person toileting, which includes using toliet, bedpan, or urinal?: Total Help from another person bathing (including washing, rinsing, drying)?: Total Help from another person to put on  and taking off regular upper body clothing?: Total Help from another person to put on and taking off regular lower body clothing?: Total 6 Click Score: 8    End of Session    OT Visit Diagnosis: Other abnormalities of gait and mobility (R26.89);Muscle weakness (generalized) (M62.81);Pain;Other symptoms and signs involving cognitive function   Activity Tolerance Patient tolerated treatment well   Patient Left in bed;with call bell/phone within reach;with bed alarm set   Nurse Communication Mobility status        Time: 6213-0865 OT Time Calculation (min): 28 min  Charges: OT General Charges $OT Visit: 1 Visit OT Treatments $Self Care/Home Management : 23-37 mins  Alfonse Flavors, OTA Acute Rehabilitation Services  Office [phone]   Dewain Penning 11/06/2022, 1:05 PM

## 2022-11-07 DIAGNOSIS — J9621 Acute and chronic respiratory failure with hypoxia: Secondary | ICD-10-CM | POA: Diagnosis not present

## 2022-11-07 DIAGNOSIS — Z66 Do not resuscitate: Secondary | ICD-10-CM

## 2022-11-07 DIAGNOSIS — E43 Unspecified severe protein-calorie malnutrition: Secondary | ICD-10-CM | POA: Diagnosis not present

## 2022-11-07 DIAGNOSIS — J189 Pneumonia, unspecified organism: Secondary | ICD-10-CM | POA: Diagnosis not present

## 2022-11-07 DIAGNOSIS — G8250 Quadriplegia, unspecified: Secondary | ICD-10-CM

## 2022-11-07 DIAGNOSIS — Z7189 Other specified counseling: Secondary | ICD-10-CM | POA: Diagnosis not present

## 2022-11-07 DIAGNOSIS — G931 Anoxic brain damage, not elsewhere classified: Secondary | ICD-10-CM | POA: Diagnosis not present

## 2022-11-07 DIAGNOSIS — J9601 Acute respiratory failure with hypoxia: Secondary | ICD-10-CM | POA: Diagnosis not present

## 2022-11-07 LAB — GLUCOSE, CAPILLARY
Glucose-Capillary: 92 mg/dL (ref 70–99)
Glucose-Capillary: 75 mg/dL (ref 70–99)
Glucose-Capillary: 84 mg/dL (ref 70–99)
Glucose-Capillary: 66 mg/dL — ABNORMAL LOW (ref 70–99)
Glucose-Capillary: 101 mg/dL — ABNORMAL HIGH (ref 70–99)

## 2022-11-07 MED ORDER — HALOPERIDOL LACTATE 5 MG/ML IJ SOLN
0.5000 mg | INTRAMUSCULAR | Status: DC | PRN
  Administered 2022-11-07: 0.5 mg via INTRAVENOUS
  Filled 2022-11-07: qty 1

## 2022-11-07 MED ORDER — ALBUTEROL SULFATE (2.5 MG/3ML) 0.083% IN NEBU: 2.5000 mg | INHALATION_SOLUTION | RESPIRATORY_TRACT | Status: DC | PRN

## 2022-11-07 MED ORDER — HALOPERIDOL LACTATE 2 MG/ML PO CONC: 0.5000 mg | ORAL | Status: DC | PRN

## 2022-11-07 MED ORDER — LORAZEPAM 2 MG/ML IJ SOLN
1.0000 mg | INTRAMUSCULAR | Status: DC | PRN
  Administered 2022-11-07 – 2022-11-08 (×2): 1 mg via INTRAVENOUS
  Filled 2022-11-07 (×2): qty 1

## 2022-11-07 MED ORDER — ATROPINE SULFATE 1 % OP SOLN
4.0000 [drp] | OPHTHALMIC | Status: DC | PRN
  Administered 2022-11-11 – 2022-11-12 (×5): 4 [drp] via SUBLINGUAL
  Filled 2022-11-07: qty 2

## 2022-11-07 MED ORDER — HALOPERIDOL 0.5 MG PO TABS: 0.5000 mg | ORAL_TABLET | ORAL | Status: DC | PRN

## 2022-11-07 MED ORDER — LORAZEPAM 2 MG/ML PO CONC
1.0000 mg | ORAL | Status: DC | PRN
  Administered 2022-11-11 – 2022-11-12 (×4): 1 mg via SUBLINGUAL
  Filled 2022-11-07 (×4): qty 1

## 2022-11-07 MED ORDER — ONDANSETRON 4 MG PO TBDP: 4.0000 mg | ORAL_TABLET | Freq: Four times a day (QID) | ORAL | Status: DC | PRN

## 2022-11-07 MED ORDER — ONDANSETRON HCL 4 MG/2ML IJ SOLN
4.0000 mg | Freq: Four times a day (QID) | INTRAMUSCULAR | Status: DC | PRN
  Administered 2022-11-11: 4 mg via INTRAVENOUS
  Filled 2022-11-07: qty 2

## 2022-11-07 MED ORDER — POTASSIUM CHLORIDE 2 MEQ/ML IV SOLN: INTRAVENOUS | Status: DC

## 2022-11-07 MED ORDER — LORAZEPAM 1 MG PO TABS: 1.0000 mg | ORAL_TABLET | ORAL | Status: DC | PRN

## 2022-11-07 MED ORDER — MORPHINE SULFATE (CONCENTRATE) 10 MG/0.5ML PO SOLN: 5.0000 mg | ORAL | Status: DC | PRN

## 2022-11-07 NOTE — Progress Notes (Signed)
Pt was very agitated given PRN meds, not working. Ate 100% dinner , drink .  Pt calm and quiet at this time.

## 2022-11-07 NOTE — Progress Notes (Signed)
Spoke to Lorinda Creed from palliative care, she has spoken to sister on phone.  Sister has decided to make patient full comfort care.  We will initiate comfort measures, change CODE STATUS to DNR.  We will change medications for comfort only.

## 2022-11-07 NOTE — Progress Notes (Signed)
   11/07/22 0452  Assess: MEWS Score  Temp 98 F (36.7 C)  BP (!) 93/53  MAP (mmHg) 65  Pulse Rate 72  ECG Heart Rate 74  Resp (!) 24  SpO2 99 %  O2 Device Room Air  Patient Activity (if Appropriate) In bed  Assess: MEWS Score  MEWS Temp 0  MEWS Systolic 1  MEWS Pulse 0  MEWS RR 1  MEWS LOC 0  MEWS Score 2  MEWS Score Color Yellow  Assess: if the MEWS score is Yellow or Red  Were vital signs taken at a resting state? Yes  Focused Assessment No change from prior assessment  Does the patient meet 2 or more of the SIRS criteria? Yes  Does the patient have a confirmed or suspected source of infection? Yes  MEWS guidelines implemented *See Row Information* Yes  Treat  MEWS Interventions Administered scheduled meds/treatments (scheduled midodrine given)  Take Vital Signs  Increase Vital Sign Frequency  Yellow: Q 2hr X 2 then Q 4hr X 2, if remains yellow, continue Q 4hrs  Assess: SIRS CRITERIA  SIRS Temperature  0  SIRS Pulse 0  SIRS Respirations  1  SIRS WBC 1  SIRS Score Sum  2

## 2022-11-07 NOTE — Progress Notes (Signed)
[facility]  PROGRESS NOTE  [name] YWV:[id number] DOB: 08-15-1966 DOA: 10/21/2022 PCP: No primary care provider on file.   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  ETT from October 25 to October 28.  Found to have large abscess of right abdominal wall s/p bedside incision and drainage by general surgery.  Patient improved, successfully extubated and transferred to [facility] service on 10/29/2022. Hospital course was complicated by aspiration pneumonia, difficult IV access and low blood pressure.    Subjective   Patient seen and examined, lethargic this morning.    Assessment/Plan:    Septic shock secondary to pneumonia/intra-abdominal abscess -Resolved, patient is off pressor support -Continue with midodrine for soft BP -Adrenal insufficiency ruled out from normal cosyntropin stimulation test -Patient treated with IV vancomycin for 10 days for intra-abdominal abscess, management per general surgery -Completed 5 days of treatment with cefepime for aspiration pneumonia   Hypotension -Persistently hypotensive -Currently on midodrine 10 mg p.o. every 8 hours  A line was placed, and there is a big discrepancy between actual blood pressure reading via A-line  VS blood pressure cuff check at least of 20 mmHg, so should be able to tolerate systolic blood pressure upper 85I or 80s as it does actually reflect appropriate blood pressure in the 90s or 100.  -Blood pressure has improved  Acute respiratory with hypoxemia/respiratory acidosis -Resolved, currently  extubated -Continue guaifenesin per tube to loosen secretions -Scopolamine patch has been discontinued  History of anoxic brain injury, TBI -Seizure disorder/encephalopathy/hospital delirium -Continue home Haldol per tube -Continue valproic acid per tube -Continue Klonopin twice daily  Dysphagia -Patient had core track tube which was pulled by patient -NG tube has been reinserted for tube feeding -Underwent MBS today, started on dysphagia 2 diet per speech therapy -Unfortunately patient's p.o. intake is inadequate so he has been getting episodes of hypoglycemia -Was started on D10W for persistent hypoglycemia -Continue tube feeds via NG tube  Hypoglycemia -Patient currently on D10 LR -CBG is still low, requiring D10 LR   Right abdominal wall abscess -History of abdominal hernia -CT A/P showed enlarging 10.7 x 8.6 x 7.5 cm abnormal fluid collection concerning for possible abscess.  Successful bedside I&D by CCS on 10/30.  Mild tenderness around the incision site but no complications -CCS following, appreciate recs -Continue vancomycin, de-escalate based on wound culture and sensitivities -Dressing changes per CCS -Continue IV Protonix for evidence of gastritis on imaging.  Paroxysmal atrial fibrillation -Resolved -Remains in normal sinus rhythm but has intermittent bradycardia CHADVAs score of 2. No electrolyte abnormalities. TSH within normal limits. Patient is at a significant risk of falls at baseline.  Patient's risk of bleeding outweighs his risk of stroke so we will hold off on long-term anticoagulation.  HR has been stable in the 60s. -Telemetry -Lovenox for DVT prophylaxis -Trend and replete electrolytes  Cardiomyopathy, ischemic Last TTE 09/2017 showed EF 60 to 65% and akinesis of the basal inferior myocardium. Repeat TTE showed EF 50 to 55%, with global hypokinesis but no valvular abnormalities. -Monitor and replete electrolytes -Strict I&O's    Hypothyroidism Repeat stable at TSH 5.5. -Continue Synthroid  225 mcg daily  Protein calorie malnutrition (HCC) -Cortrak placed on 10/30.  -Continue TF for now  Goals of care -Palliative care has been consulted, also discussed with patient's sister on phone.  She is considering comfort measures after talking to patient's sons  Medications     Chlorhexidine Gluconate Cloth  6 each Topical Daily   clonazePAM  0.5 mg Per Tube BID   enoxaparin (LOVENOX) injection  40 mg Subcutaneous Q24H   feeding supplement (PROSource TF20)  60 mL Per Tube Daily   guaiFENesin  5 mL Per Tube Q4H   haloperidol  5 mg Per Tube QPM   levothyroxine  225 mcg Per Tube Q0600   midodrine  10 mg Per Tube Q8H   pantoprazole (PROTONIX) IV  40 mg Intravenous Q12H   sertraline  75 mg Per Tube Daily   sodium chloride flush  10-40 mL Intracatheter Q12H   valproic acid  500 mg Per Tube TID     Data Reviewed:   CBG:  Recent Labs  Lab 11/06/22 1549 11/06/22 2105 11/06/22 2327 11/07/22 0458 11/07/22 0807  GLUCAP 74 71 83 92 75    SpO2: 97 % O2 Flow Rate (L/min): 2 L/min FiO2 (%): 30 %    Vitals:   11/06/22 2329 11/07/22 0452 11/07/22 0540 11/07/22 0641  BP: (!) 122/53 (!) 93/53 (!) 96/54 (!) 98/56  Pulse: 74 72 67 63  Resp: (!) 24 (!) 24 (!) 22 18  Temp: 98.7 F (37.1 C) 98 F (36.7 C)  98.6 F (37 C)  TempSrc: Oral Oral  Oral  SpO2: 99% 99% 96% 97%  Weight:  81.3 kg    Height:          Data Reviewed:  Basic Metabolic Panel: Recent Labs  Lab 11/01/22 0546 11/02/22 0347 11/03/22 0040 11/04/22 0500 11/05/22 0500  NA 138 134* 135 136 137  K 4.2 3.9 4.4 3.9 3.9  CL 105 103 102 104 105  CO2 28 26 26 25 27   GLUCOSE 67* 134* 76 91 61*  BUN 11 13 17 13 16   CREATININE 0.75 0.76 0.78 0.69 0.87  CALCIUM 8.5* 8.0* 8.5* 8.5* 8.6*    CBC: Recent Labs  Lab 11/01/22 0546 11/06/22 0457  WBC 5.0 6.4  HGB 9.2* 10.6*  HCT 28.7* 32.5*  MCV 90.0 89.8  PLT 216 186    LFT Recent  Labs  Lab 11/05/22 0500  AST 15  ALT 9  ALKPHOS 41  BILITOT 0.3  PROT 5.6*  ALBUMIN 2.5*     Antibiotics: Anti-infectives (From admission, onward)    Start     Dose/Rate Route Frequency Ordered Stop   10/30/22 1100  ceFEPIme (MAXIPIME) 2 g in sodium chloride 0.9 % 100 mL IVPB        2 g 200 mL/hr over 30 Minutes Intravenous Every 8 hours 10/30/22 0954 11/04/22 1813   10/29/22 0000  vancomycin (VANCOREADY) IVPB 750 mg/150 mL        750 mg 150 mL/hr over 60 Minutes Intravenous Every 12 hours 10/28/22 1425 11/02/22 1325   10/27/22 1315  vancomycin (VANCOCIN) IVPB 1000 mg/200 mL premix  Status:  Discontinued        1,000 mg 200 mL/hr over 60 Minutes Intravenous Every 12 hours 10/27/22 1224 10/28/22 1425   10/27/22 1230  vancomycin (VANCOCIN) IVPB 1000 mg/200 mL premix  Status:  Discontinued        1,000 mg 200 mL/hr over 60 Minutes Intravenous Every 12 hours 10/27/22 1222 10/27/22 1223  10/24/22 2200  vancomycin (VANCOCIN) IVPB 1000 mg/200 mL premix  Status:  Discontinued        1,000 mg 200 mL/hr over 60 Minutes Intravenous Every 12 hours 10/24/22 1138 10/27/22 1222   10/24/22 1230  vancomycin (VANCOREADY) IVPB 1250 mg/250 mL        1,250 mg 166.7 mL/hr over 90 Minutes Intravenous  Once 10/24/22 1138 10/24/22 1431   10/23/22 1130  cefTRIAXone (ROCEPHIN) 2 g in sodium chloride 0.9 % 100 mL IVPB        2 g 200 mL/hr over 30 Minutes Intravenous Every 24 hours 10/23/22 1041 10/26/22 1324   10/22/22 2200  ceFEPIme (MAXIPIME) 2 g in sodium chloride 0.9 % 100 mL IVPB  Status:  Discontinued        2 g 200 mL/hr over 30 Minutes Intravenous Every 8 hours 10/22/22 1559 10/23/22 1041   10/22/22 0900  vancomycin (VANCOREADY) IVPB 750 mg/150 mL  Status:  Discontinued        750 mg 150 mL/hr over 60 Minutes Intravenous Every 12 hours 10/21/22 2041 10/22/22 1559   10/22/22 0700  ceFEPIme (MAXIPIME) 2 g in sodium chloride 0.9 % 100 mL IVPB  Status:  Discontinued        2 g 200 mL/hr over 30  Minutes Intravenous Every 12 hours 10/21/22 2041 10/22/22 1559   10/21/22 1900  vancomycin (VANCOREADY) IVPB 1750 mg/350 mL        1,750 mg 175 mL/hr over 120 Minutes Intravenous  Once 10/21/22 1854 10/21/22 2125   10/21/22 1900  ceFEPIme (MAXIPIME) 2 g in sodium chloride 0.9 % 100 mL IVPB        2 g 200 mL/hr over 30 Minutes Intravenous  Once 10/21/22 1854 10/21/22 1924   10/21/22 1900  azithromycin (ZITHROMAX) 500 mg in sodium chloride 0.9 % 250 mL IVPB  Status:  Discontinued        500 mg 250 mL/hr over 60 Minutes Intravenous Every 24 hours 10/21/22 1854 10/23/22 1041   10/21/22 1845  vancomycin (VANCOCIN) IVPB 1000 mg/200 mL premix  Status:  Discontinued        1,000 mg 200 mL/hr over 60 Minutes Intravenous  Once 10/21/22 1835 10/21/22 1851   10/21/22 1845  levofloxacin (LEVAQUIN) IVPB 750 mg  Status:  Discontinued        750 mg 100 mL/hr over 90 Minutes Intravenous  Once 10/21/22 1835 10/21/22 1849        DVT prophylaxis: Lovenox  Code Status: Partial code with no CPR, no defibrillation or cardioversion.  Yes to mechanical ventilation and intubation  Family Communication: Spoke to patient's sister on phone, she wants to discuss with patient's sons and if they all agreed and plan is to transition to comfort measures only.   CONSULTS    Objective    Physical Examination:   Patient is lethargic Heart S1-S2, regular Lungs clear to auscultation bilaterally Abdomen is soft, nontender, no organomegaly Neuro lethargic, barely opens eyes to tactile stimuli   Status is: Inpatient:      Pressure Injury 10/22/22 Buttocks Stage 1 -  Intact skin with non-blanchable redness of a localized area usually over a bony prominence. (Active)  10/22/22 0030  Location: Buttocks  Location Orientation:   Staging: Stage 1 -  Intact skin with non-blanchable redness of a localized area usually over a bony prominence.  Wound Description (Comments):   Present on Admission: Yes         [place]  [facility] If 7PM-7AM, please contact night-coverage at [email or web address], Office  [phone]   11/07/2022, 10:02 AM  LOS: 17 days

## 2022-11-07 NOTE — Progress Notes (Signed)
Pt. Asked for food ,ate 50% and drank  thickened milk and juice without difficulty swallowing. Verbalized "more, more".

## 2022-11-07 NOTE — Progress Notes (Signed)
Patient ID: [name], male   DOB: [birth year], 56 y.o.   MRN: [id number]    Progress Note from the Palliative Medicine Team at [facility]   Patient Name: [name]        Date: 11/07/2022 DOB: [birth year]  Age: 56 y.o. MRN#: [id number] Attending Physician: Meredeth Ide, MD Primary Care Physician: No primary care provider on file. Admit Date: 10/21/2022   Medical records reviewed   56 y.o. male  admitted on 10/21/2022   from [facility] for hypoxia and unresponsiveness.     Admitted for treatment stabilization.   Work up significant for respiratory failure in the setting of bilateral lower lobe pneumonia.  Intubated.  Found to have large abscesses on right abdominal wall   Chronic SNF resident due to demential and anoxic brain injury.  Patient is bed/wheelchair bound at baseline    Patient does not have capacity to make medical decisions.  Sister is legal guardian    This NP assessed patient at the bedside as a follow up for palliative medicine needs and emotional support.  Patient remains nonverbal, unable to follow commands and unfortunately has continued with hypo tension, poor p.o. intake, hypoglycemia and overall failure to thrive within the context of full medical support.      I was able to speak again today  to sister by phone.  [name]/Sister verbalizes a clear understanding of the current medical situation and the associated poor prognosis.  [name] verbalizes her hope for her brother is comfort and dignity at this time in his life, she is expressed his suffering over many years.  [name] was hoping that she would have the opportunity to talk with the patient's 3 sons but unfortunately that has not occurred.  She will continue to reach out.  Patient's sister has made the decision to focus on comfort, quality and dignity, allowing for a natural death .      Education offered on the difference between aggressive medical intervention path and an intense comfort  path for this patient, at this time, in this situation.  Detailed full comfort focus on comfort, quality and dignity allowing for natural death.    Plan of care: -DNR/DNI -No artificial feeding or hydration now or in the future -No further diagnostics, lab draws, Iv antibiotics -Symptom management to ensure comfort -Transition of care dependent on outcomes over the next 24 to 48 hours.  It is likely that patient will have hospital death..   Education offered on hospice benefit; philosophy and eligibility.  Detailed hospice benefits specific to residential hospice for end-of-life care  Education offered natural trajectory and expectations at end-of-life.  Questions and concerns addressed   Discussed with Dr Sharl Ma  PMT will continue to support holistically   50  minute   Lorinda Creed NP  Palliative Medicine Team Team Phone # [phone] Pager [phone]

## 2022-11-08 DIAGNOSIS — G8250 Quadriplegia, unspecified: Secondary | ICD-10-CM | POA: Diagnosis not present

## 2022-11-08 DIAGNOSIS — J9600 Acute respiratory failure, unspecified whether with hypoxia or hypercapnia: Secondary | ICD-10-CM

## 2022-11-08 DIAGNOSIS — Z7189 Other specified counseling: Secondary | ICD-10-CM | POA: Diagnosis not present

## 2022-11-08 DIAGNOSIS — J9601 Acute respiratory failure with hypoxia: Secondary | ICD-10-CM | POA: Diagnosis not present

## 2022-11-08 DIAGNOSIS — J189 Pneumonia, unspecified organism: Secondary | ICD-10-CM | POA: Diagnosis not present

## 2022-11-08 MED ORDER — ORAL CARE MOUTH RINSE
15.0000 mL | OROMUCOSAL | Status: DC
  Administered 2022-11-08 – 2022-11-10 (×9): 15 mL via OROMUCOSAL

## 2022-11-08 MED ORDER — ACETAMINOPHEN 160 MG/5ML PO SOLN
650.0000 mg | Freq: Four times a day (QID) | ORAL | Status: DC | PRN
  Filled 2022-11-08: qty 20.3

## 2022-11-08 MED ORDER — FENTANYL CITRATE PF 50 MCG/ML IJ SOSY
25.0000 ug | PREFILLED_SYRINGE | INTRAMUSCULAR | Status: DC | PRN
  Administered 2022-11-08: 25 ug via INTRAVENOUS
  Filled 2022-11-08: qty 1

## 2022-11-08 MED ORDER — ACETAMINOPHEN 325 MG PO TABS
650.0000 mg | ORAL_TABLET | Freq: Four times a day (QID) | ORAL | Status: DC | PRN
  Filled 2022-11-08: qty 2

## 2022-11-08 MED ORDER — ORAL CARE MOUTH RINSE: 15.0000 mL | OROMUCOSAL | Status: DC | PRN

## 2022-11-08 NOTE — Progress Notes (Signed)
[facility]  PROGRESS NOTE  [name] ZOX:[id number] DOB: 02/03/1966 DOA: 10/21/2022 PCP: No primary care provider on file.   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  ETT from October 25 to October 28.  Found to have large abscess of right abdominal wall s/p bedside incision and drainage by general surgery.  Patient improved, successfully extubated and transferred to [facility] service on 10/29/2022. Hospital course was complicated by aspiration pneumonia, difficult IV access and low blood pressure.    Subjective   Patient seen and examined, denies any complaints.   Assessment/Plan:    Septic shock secondary to pneumonia/intra-abdominal abscess -Resolved, patient is off pressor support -Continue with midodrine for soft BP -Adrenal insufficiency ruled out from normal cosyntropin stimulation test -Patient treated with IV vancomycin for 10 days for intra-abdominal abscess, management per general surgery -Completed 5 days of treatment with cefepime for aspiration pneumonia   Patient is now comfort measures only  Hypotension -Resolved  Acute respiratory with hypoxemia/respiratory acidosis -Resolved, currently extubated    History of anoxic brain injury, TBI -Seizure disorder/encephalopathy/hospital delirium -Continue home Haldol per tube -Continue Klonopin twice daily -Patient is comfort measures only  Dysphagia -Patient had core track tube which was pulled by patient -NG tube has been reinserted for tube feeding -Underwent MBS today, started on dysphagia 2  diet per speech therapy -Unfortunately patient's p.o. intake is inadequate so he has been getting episodes of hypoglycemia -Was started on D10W for persistent hypoglycemia -At this time IV fluids and tube feeding has been discontinued  Hypoglycemia -Patient was requiring D10 LR -IV fluids have been discontinued   Right abdominal wall abscess -History of abdominal hernia -CT A/P showed enlarging 10.7 x 8.6 x 7.5 cm abnormal fluid collection concerning for possible abscess.  Successful bedside I&D by CCS on 10/30.  Mild tenderness around the incision site but no complications -CCS following, appreciate recs -Continue vancomycin, de-escalate based on wound culture and sensitivities -Dressing changes per CCS -Continue IV Protonix for evidence of gastritis on imaging.     Goals of care -Palliative care has been consulted, also discussed with patient's sister on phone.  She decided to make patient full comfort care.  Medications     clonazePAM  0.5 mg Per Tube BID   mouth rinse  15 mL Mouth Rinse 4 times per day     Data Reviewed:   CBG:  Recent Labs  Lab 11/07/22 0458 11/07/22 0807 11/07/22 1121 11/07/22 1639 11/07/22 1734  GLUCAP 92 75 84 66* 101*    SpO2: 100 % O2 Flow Rate (L/min): 2 L/min FiO2 (%): 30 %    Vitals:   11/07/22 1200 11/08/22 0021 11/08/22 0330 11/08/22 0505  BP: (!) 102/57 (!) 143/107    Pulse: 63 94    Resp: (!) 24 20 (!) 31 (!) 28  Temp: 97.9 F (36.6 C) 98.7 F (37.1 C)    TempSrc: Axillary Oral    SpO2: 94% 100%    Weight:      Height:          Data Reviewed:  Basic Metabolic Panel: Recent Labs  Lab 11/02/22 0347 11/03/22 0040 11/04/22 0500  11/05/22 0500  NA 134* 135 136 137  K 3.9 4.4 3.9 3.9  CL 103 102 104 105  CO2 26 26 25 27   GLUCOSE 134* 76 91 61*  BUN 13 17 13 16   CREATININE 0.76 0.78 0.69 0.87  CALCIUM 8.0* 8.5* 8.5* 8.6*    CBC: Recent Labs  Lab 11/06/22 0457  WBC 6.4  HGB 10.6*  HCT 32.5*  MCV 89.8   PLT 186    LFT Recent Labs  Lab 11/05/22 0500  AST 15  ALT 9  ALKPHOS 41  BILITOT 0.3  PROT 5.6*  ALBUMIN 2.5*     Antibiotics: Anti-infectives (From admission, onward)    Start     Dose/Rate Route Frequency Ordered Stop   10/30/22 1100  ceFEPIme (MAXIPIME) 2 g in sodium chloride 0.9 % 100 mL IVPB        2 g 200 mL/hr over 30 Minutes Intravenous Every 8 hours 10/30/22 0954 11/04/22 1813   10/29/22 0000  vancomycin (VANCOREADY) IVPB 750 mg/150 mL        750 mg 150 mL/hr over 60 Minutes Intravenous Every 12 hours 10/28/22 1425 11/02/22 1325   10/27/22 1315  vancomycin (VANCOCIN) IVPB 1000 mg/200 mL premix  Status:  Discontinued        1,000 mg 200 mL/hr over 60 Minutes Intravenous Every 12 hours 10/27/22 1224 10/28/22 1425   10/27/22 1230  vancomycin (VANCOCIN) IVPB 1000 mg/200 mL premix  Status:  Discontinued        1,000 mg 200 mL/hr over 60 Minutes Intravenous Every 12 hours 10/27/22 1222 10/27/22 1223   10/24/22 2200  vancomycin (VANCOCIN) IVPB 1000 mg/200 mL premix  Status:  Discontinued        1,000 mg 200 mL/hr over 60 Minutes Intravenous Every 12 hours 10/24/22 1138 10/27/22 1222   10/24/22 1230  vancomycin (VANCOREADY) IVPB 1250 mg/250 mL        1,250 mg 166.7 mL/hr over 90 Minutes Intravenous  Once 10/24/22 1138 10/24/22 1431   10/23/22 1130  cefTRIAXone (ROCEPHIN) 2 g in sodium chloride 0.9 % 100 mL IVPB        2 g 200 mL/hr over 30 Minutes Intravenous Every 24 hours 10/23/22 1041 10/26/22 1324   10/22/22 2200  ceFEPIme (MAXIPIME) 2 g in sodium chloride 0.9 % 100 mL IVPB  Status:  Discontinued        2 g 200 mL/hr over 30 Minutes Intravenous Every 8 hours 10/22/22 1559 10/23/22 1041   10/22/22 0900  vancomycin (VANCOREADY) IVPB 750 mg/150 mL  Status:  Discontinued        750 mg 150 mL/hr over 60 Minutes Intravenous Every 12 hours 10/21/22 2041 10/22/22 1559   10/22/22 0700  ceFEPIme (MAXIPIME) 2 g in sodium chloride 0.9 % 100 mL IVPB  Status:  Discontinued         2 g 200 mL/hr over 30 Minutes Intravenous Every 12 hours 10/21/22 2041 10/22/22 1559   10/21/22 1900  vancomycin (VANCOREADY) IVPB 1750 mg/350 mL        1,750 mg 175 mL/hr over 120 Minutes Intravenous  Once 10/21/22 1854 10/21/22 2125   10/21/22 1900  ceFEPIme (MAXIPIME) 2 g in sodium chloride 0.9 % 100 mL IVPB        2 g 200 mL/hr over 30 Minutes Intravenous  Once 10/21/22 1854 10/21/22 1924   10/21/22 1900  azithromycin (ZITHROMAX) 500 mg in sodium chloride 0.9 % 250 mL IVPB  Status:  Discontinued  500 mg 250 mL/hr over 60 Minutes Intravenous Every 24 hours 10/21/22 1854 10/23/22 1041   10/21/22 1845  vancomycin (VANCOCIN) IVPB 1000 mg/200 mL premix  Status:  Discontinued        1,000 mg 200 mL/hr over 60 Minutes Intravenous  Once 10/21/22 1835 10/21/22 1851   10/21/22 1845  levofloxacin (LEVAQUIN) IVPB 750 mg  Status:  Discontinued        750 mg 100 mL/hr over 90 Minutes Intravenous  Once 10/21/22 1835 10/21/22 1849        DVT prophylaxis: Lovenox  Code Status: Partial code with no CPR, no defibrillation or cardioversion.  Yes to mechanical ventilation and intubation  Family Communication: Spoke to patient's sister on phone, she wants to discuss with patient's sons and if they all agreed and plan is to transition to comfort measures only.   CONSULTS    Objective    Physical Examination:   Alert, appears in no acute distress S1-S2, regular, no murmur auscultated Clear to auscultation bilaterally Abdomen is soft, nontender, no organomegaly  Status is: Inpatient:      Pressure Injury 10/22/22 Buttocks Stage 1 -  Intact skin with non-blanchable redness of a localized area usually over a bony prominence. (Active)  10/22/22 0030  Location: Buttocks  Location Orientation:   Staging: Stage 1 -  Intact skin with non-blanchable redness of a localized area usually over a bony prominence.  Wound Description (Comments):   Present on Admission: Yes         Damon Hargrove S [place]   [facility] If 7PM-7AM, please contact night-coverage at [email or web address], Office  [phone]   11/08/2022, 10:24 AM  LOS: 18 days

## 2022-11-08 NOTE — Progress Notes (Signed)
Patient was started on morphine due to being on comfort care.  RN was concerned due to patient being allergic to morphine and related.  Pharmacy cannot be reached to confirm.  Patient was temporarily placed on fentanyl as needed and Tylenol pending confirmation with pharmacy.

## 2022-11-08 NOTE — Progress Notes (Addendum)
Pt restless in bed, groaning and appears uncomfortable. Noted morphine ordered but morphine listed as allergy but no specific reaction noted. Contacted pharmacy and they do not have further details about reaction. RN reviewed chart and unable to find record of pt receiving morphine. However, noted pt was on fentanyl infusion in ICU.  Night MD notified and order received for PRN fentanyl and tylenol.  Fentanyl given with good effect; pt calm and resting.

## 2022-11-09 DIAGNOSIS — J9601 Acute respiratory failure with hypoxia: Secondary | ICD-10-CM | POA: Diagnosis not present

## 2022-11-09 DIAGNOSIS — J9621 Acute and chronic respiratory failure with hypoxia: Secondary | ICD-10-CM | POA: Diagnosis not present

## 2022-11-09 DIAGNOSIS — G8929 Other chronic pain: Secondary | ICD-10-CM

## 2022-11-09 DIAGNOSIS — Z515 Encounter for palliative care: Secondary | ICD-10-CM | POA: Diagnosis not present

## 2022-11-09 DIAGNOSIS — J189 Pneumonia, unspecified organism: Secondary | ICD-10-CM | POA: Diagnosis not present

## 2022-11-09 MED ORDER — HYDROMORPHONE HCL 1 MG/ML PO LIQD
1.0000 mg | ORAL | Status: DC
  Administered 2022-11-09 – 2022-11-12 (×20): 1 mg via ORAL
  Filled 2022-11-09 (×21): qty 1

## 2022-11-09 MED ORDER — HYDROMORPHONE HCL 1 MG/ML PO LIQD
0.5000 mg | ORAL | Status: DC | PRN
  Administered 2022-11-12 (×2): 0.5 mg via ORAL
  Filled 2022-11-09 (×2): qty 1

## 2022-11-09 MED ORDER — FENTANYL CITRATE PF 50 MCG/ML IJ SOSY: 50.0000 ug | PREFILLED_SYRINGE | INTRAMUSCULAR | Status: DC | PRN

## 2022-11-09 NOTE — Progress Notes (Signed)
Patient ID: [name], male   DOB: 24-Jun-1966, 56 y.o.   MRN: [id number]    Progress Note from the Palliative Medicine Team at [facility]   Patient Name: [name]        Date: 11/09/2022 DOB: Jun 17, 1966  Age: 56 y.o. MRN#: [id number] Attending Physician: Marinda Elk, MD Primary Care Physician: No primary care provider on file. Admit Date: 10/21/2022   Medical records reviewed   56 y.o. male  admitted on 10/21/2022   from [facility] for hypoxia and unresponsiveness.     Admitted for treatment stabilization.   Work up significant for respiratory failure in the setting of bilateral lower lobe pneumonia.  Intubated.  Found to have large abscesses on right abdominal wall   Chronic SNF resident due to demential and anoxic brain injury.  Patient is bed/wheelchair bound at baseline    Patient does not have capacity to make medical decisions.  Sister is legal guardian.    Patient continued decline within the context of full medical support.  This NP assessed patient at the bedside as a follow up for palliative medicine needs and emotional support.  Patient remains nonverbal, unable to follow commands and unfortunately has continued with hypo tension, poor p.o. intake, hypoglycemia and overall failure to thrive within the context of full medical support.  Patient's sister/H POA made decision to shift to a full comfort path allowing for natural death.  Plan of care: -DNR/DNI -No artificial feeding or hydration now or in the future -No further diagnostics, lab draws, Iv antibiotics -Symptom management to ensure comfort -Transition of care dependent on outcomes over the next 24 to 48 hours.  Family is interested and residential hospice care closer to his sister's home    I was able to speak again today  to sister by phone.  [name]/Sister verbalized comfort with her decision to focus on comfort's time.  "I just want him to be comfortable"    Steven Rhodes again expresses  his suffering over many years.     Education offered on the difference between aggressive medical intervention path and an intense comfort path for this patient, at this time, in this situation.  Detailed full comfort focus on comfort, quality and dignity allowing for natural death.  Education offered on the utilization of medications to enhance comfort.  [name]/H POA shares that her brother had significant underlying pain over the years and was treated under a pain clinic at 1 point in time.  He has taken many different opioids and does not have an allergy to Ativan or morphine.  Discussed with pharmacy and discontinued those allergies specific to morphine and Ativan   Symptom management       -Chronic pain-  Dilaudid-- 0.5 mg po/sl every 4 hours scheduled /do not hold for sedation   Education offered natural trajectory and expectations at end-of-life.  Questions and concerns addressed   Discussed with Dr Radonna Ricker  I let the patient's family and attending know that I will be out of the hospital until next Monday.  A provider with the palliative medicine team is available for questions or concerns.  Please call team phone with questions or concerns  # [phone]  PMT will continue to support holistically   50  minute   Lorinda Creed NP  Palliative Medicine Team Team Phone # [phone]

## 2022-11-09 NOTE — Progress Notes (Signed)
[facility] PROGRESS NOTE    Progress Note  [name]  OQH:[id number] DOB: [birth year] DOA: 10/21/2022 PCP: No primary care provider on file.     Brief Narrative:   [name] is an 56 y.o. 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  He required intubation admitted to the ICU found to have a large intra-abdominal abscess status post incision and drainage by general surgery successfully extubated and transferred to [facility] on October 29, 2022. Hospital course has been complicated by aspiration pneumonia and difficult IV access with low blood pressure.   Assessment/Plan:   Septic shock secondary to pneumonia/intra-abdominal abscess: Off pressors now resolved continues to be on midodrine due to soft blood pressure. Normal cosyntropin stimulating test. Patient treated with 10 days of IV vancomycin and cefepime. Patient is now comfort measures only. Patient will probably need residential hospice facility placement.  Hypotension: Now resolved.  Acute respiratory failure with hypoxia/respiratory acidosis: Resolved currently extubated.  History of anoxic brain injury: Continue Klonopin twice a day. Haldol per tube. Patient is comfort measures.  History of seizures: Currently no AEDs. He is on Klonopin twice a day at the facility and Depakote probably for agitation. Continue Klonopin continue hold Depakote.  Dysphagia: Fluids and tube feeding has been discontinued.  Episode of hypoglycemia: Was treated with D10.  Right intra-abdominal abscess: Completed 10 days of antibiotics surgery was consulted and performed bedside I&D on October 30.  Goals of  care: Palliative Care has been consulted the family said to move towards comfort care. Currently on fentanyl as needed.  Sacral decubitus ulcer stage I present on admission: RN Pressure Injury Documentation: Pressure Ulcer 12/22/11 reddend, skin break down, minimal drainage present. no odor (Active)  12/22/11 1030  Location: Ankle  Location Orientation: Left  Staging:   Wound Description (Comments): reddend, skin break down, minimal drainage present. no odor  Present on Admission: Yes     Pressure Injury 10/22/22 Buttocks Stage 1 -  Intact skin with non-blanchable redness of a localized area usually over a bony prominence. (Active)  10/22/22 0030  Location: Buttocks  Location Orientation:   Staging: Stage 1 -  Intact skin with non-blanchable redness of a localized area usually over a bony prominence.  Wound Description (Comments):   Present on Admission: Yes  Dressing Type Foam - Lift dressing to assess site every shift 11/08/22 2000     DVT prophylaxis: lovenox Family Communication:none Status is: Inpatient Remains inpatient appropriate because: Septic shock now comfort measures.    Code Status:     Code Status Orders  (From admission, onward)           Start     Ordered   11/07/22 1019  Do not attempt resuscitation (DNR)  Continuous       Question Answer Comment  In the event of cardiac or respiratory ARREST Do not call a "code blue"   In the event of cardiac or respiratory ARREST Do not perform Intubation, CPR, defibrillation or ACLS   In the event of cardiac or respiratory ARREST Use medication by any route, position, wound care, and other measures to relive pain and suffering. May use oxygen, suction and manual treatment of airway obstruction as needed for comfort.  11/07/22 1021           Code Status History     Date Active Date Inactive Code Status Order ID Comments User Context   11/07/2022 1018 11/07/2022 1021 DNR [id number]  Meredeth Ide, MD  Inpatient   10/21/2022 2125 11/07/2022 1018 Partial Code [id number]  Lynnell Catalan, MD ED   10/21/2022 2120 10/21/2022 2125 DNR [id number]  Lynnell Catalan, MD ED   01/25/2018 2318 01/30/2018 2054 Full Code [id number]  Arlean Hopping, Shari Heritage, MD ED   10/03/2017 0131 10/07/2017 2150 Full Code [id number]  Hillary Bow, DO ED   01/03/2013 1001 01/05/2013 1417 Full Code [id number]  Clarnce Flock, RN Inpatient   12/22/2011 0328 12/24/2011 1900 Full Code [id number]  CothrenZadie Cleverly ED   11/23/2011 2133 11/30/2011 1923 Full Code [id number]  Cleora Fleet, MD ED         IV Access:   Peripheral IV   Procedures and diagnostic studies:   No results found.   Medical Consultants:   None.   Subjective:    [name] nonverbal  Objective:    Vitals:   11/08/22 1948 11/08/22 2100 11/08/22 2200 11/09/22 0053  BP: 101/73     Pulse:      Resp: 13 18 (!) 22 20  Temp: 97.6 F (36.4 C)     TempSrc: Oral     SpO2: 96%     Weight:      Height:       SpO2: 96 % O2 Flow Rate (L/min): 2 L/min FiO2 (%): 30 %   Intake/Output Summary (Last 24 hours) at 11/09/2022 0818 Last data filed at 11/09/2022 1749 Gross per 24 hour  Intake 150 ml  Output 2400 ml  Net -2250 ml   Filed Weights   11/05/22 0436 11/06/22 0422 11/07/22 0452  Weight: 83 kg 81.3 kg 81.3 kg    Exam: General exam: In no acute distress. Respiratory system: Good air movement and clear to auscultation. Cardiovascular system: S1 & S2 heard, RRR.  Gastrointestinal system: Abdomen is nondistended, soft and nontender.  Extremities: No pedal edema. Skin: No rashes, lesions or ulcers Psychiatry: No judgement and insight to medical condition.   Data Reviewed:    Labs: Basic Metabolic Panel: Recent Labs  Lab 11/03/22 0040 11/04/22 0500 11/05/22 0500  NA 135 136 137  K 4.4 3.9 3.9  CL 102 104 105  CO2 26 25 27   GLUCOSE 76 91 61*  BUN 17 13 16   CREATININE 0.78 0.69 0.87  CALCIUM 8.5* 8.5* 8.6*    GFR Estimated Creatinine Clearance: 92.8 mL/min (by C-G formula based on SCr of 0.87 mg/dL). Liver Function Tests: Recent Labs  Lab 11/05/22 0500  AST 15  ALT 9  ALKPHOS 41  BILITOT 0.3  PROT 5.6*  ALBUMIN 2.5*   No results for input(s): "LIPASE", "AMYLASE" in the last 168 hours. No results for input(s): "AMMONIA" in the last 168 hours. Coagulation profile No results for input(s): "INR", "PROTIME" in the last 168 hours. COVID-19 Labs  No results for input(s): "DDIMER", "FERRITIN", "LDH", "CRP" in the last 72 hours.  Lab Results  Component Value Date   SARSCOV2NAA NEGATIVE 10/21/2022   SARSCOV2NAA NEGATIVE 05/15/2022    CBC: Recent Labs  Lab 11/06/22 0457  WBC 6.4  HGB 10.6*  HCT 32.5*  MCV 89.8  PLT 186   Cardiac Enzymes: No results for input(s): "CKTOTAL", "CKMB", "CKMBINDEX", "TROPONINI" in the last 168 hours. BNP (last 3 results) No results for  input(s): "PROBNP" in the last 8760 hours. CBG: Recent Labs  Lab 11/07/22 0458 11/07/22 0807 11/07/22 1121 11/07/22 1639 11/07/22 1734  GLUCAP 92 75 84 66* 101*   D-Dimer: No results for input(s): "DDIMER" in the last 72 hours. Hgb A1c: No results for input(s): "HGBA1C" in the last 72 hours. Lipid Profile: No results for input(s): "CHOL", "HDL", "LDLCALC", "TRIG", "CHOLHDL", "LDLDIRECT" in the last 72 hours. Thyroid function studies: No results for input(s): "TSH", "T4TOTAL", "T3FREE", "THYROIDAB" in the last 72 hours.  Invalid input(s): "FREET3" Anemia work up: No results for input(s): "VITAMINB12", "FOLATE", "FERRITIN", "TIBC", "IRON", "RETICCTPCT" in the last 72 hours. Sepsis Labs: Recent Labs  Lab 11/06/22 0457  WBC 6.4   Microbiology No results found for this or any previous visit (from the past 240 hour(s)).   Medications:    clonazePAM  0.5 mg Per Tube BID   mouth rinse  15 mL Mouth Rinse 4 times per day   Continuous Infusions:    LOS: 19 days   Marinda Elk  [facility]  11/09/2022, 8:18 AM

## 2022-11-09 NOTE — Progress Notes (Signed)
Patient is alert and comfortable in bed. Cortrak removed earlier in shift. New comfort measures implemented as ordered. Call bell within reach. Bed in lowest position.

## 2022-11-10 DIAGNOSIS — G931 Anoxic brain damage, not elsewhere classified: Secondary | ICD-10-CM | POA: Diagnosis not present

## 2022-11-10 DIAGNOSIS — J9601 Acute respiratory failure with hypoxia: Secondary | ICD-10-CM | POA: Diagnosis not present

## 2022-11-10 NOTE — Progress Notes (Signed)
Patient [name]      DOB: 08/24/1966      KSH:[id number]      Palliative Medicine Team    Bedside symptom check completed. No family or visitors bedside at time of visit. Bedside RN Whitney Post without concerns or needs today. Patient alert and calm, has had good appetite today.   Discussed plan with [facility] team and updated sister Kathie Rhodes by phone. TOC working on placement closer to sister in [place], [place] if able. Patient appears stable for transfer when appropriate. Will continue to follow and support as able.   Thank you for allowing the Palliative Medicine Team to assist in the care of this patient.     Shelda Jakes, MSN, RN4 Palliative Medicine Team Team Phone: [phone]  This phone is monitored 7a-7p, please reach out to attending physician outside of these hours for urgent needs.

## 2022-11-10 NOTE — TOC Progression Note (Addendum)
Transition of Care [facility]) - Progression Note    Patient Details  Name: [name] MRN: [id number] Date of Birth: Oct 24, 1966  Transition of Care [facility]) CM/SW Contact  Erin Sons, [place] Phone Number: 11/10/2022, 2:06 PM  Clinical Narrative:     PMT continuing to follow pt for comfort care. Disposition tbd: Hospice facility vs LTC SNF.   CSW called and left message with Dahlia Client at [facility] inquiring if they can take pt under LTC with outpatient hospice.  1425: CSW received callback from [facility]. They could take pt under LTC medicaid and are contracted with [facility]. She requests updated clinicals be faxed to [phone]. CSW faxed clinicals. They would need orders once disposition is confirmed.   Expected Discharge Plan: Skilled Nursing Facility Barriers to Discharge: Continued Medical Work up  Expected Discharge Plan and Services Expected Discharge Plan: Skilled Nursing Facility       Living arrangements for the past 2 months: Skilled Nursing Facility                                       Social Determinants of Health (SDOH) Interventions    Readmission Risk Interventions     No data to display

## 2022-11-10 NOTE — Progress Notes (Signed)
[facility]                                                                               [name], is a 56 y.o. male, DOB - 1966/06/06, LKG:[id number] Admit date - 10/21/2022    Outpatient Primary MD for the patient is No primary care provider on file.  LOS - 20  days    Brief summary   56 [facility] patient has significant history of anoxic brain injury due to remote cardiac arrest, PEG dependent which was removed CAD, hypothyroidism, seizure disorder, paroxysmal atrial fibrillation not on systemic anticoagulation and adrenal insufficiency came from skilled nursing facility with hypoxemia with O2 sats in 70s, agitated likely with aspiration event, required intubation and admission to ICU.  ETT from October 25 to October 28.  Found to have large abscess of right abdominal wall s/p bedside incision and drainage by general surgery.  Patient improved, [facility] Hospital course was complicated by aspiration pneumonia, difficult IV access and low blood pressure.   Assessment & Plan    Assessment and Plan:   eptic shock secondary to pneumonia/intra-abdominal abscess -Resolved, patient is off pressor support -Continue with midodrine for soft BP -Adrenal insufficiency ruled out from normal cosyntropin stimulation test -Patient treated with IV vancomycin for 10 days for intra-abdominal abscess, management per general surgery -Completed 5 days of treatment with cefepime for aspiration pneumonia    Patient is now comfort measures only   Hypotension -Resolved   Acute respiratory with hypoxemia/respiratory acidosis -Resolved, currently extubated       History of anoxic brain injury, TBI -Seizure disorder/encephalopathy/hospital delirium -Continue home Haldol per tube -Continue Klonopin twice daily -Patient is comfort measures only   Dysphagia -Patient had core  track tube which was pulled by patient -NG tube has been reinserted for tube feeding -Underwent MBS today, started on dysphagia 2 diet per speech therapy -Unfortunately patient's p.o. intake is inadequate so he has been getting episodes of hypoglycemia -Was started on D10W for persistent hypoglycemia -At this time IV fluids and tube feeding has been discontinued   Hypoglycemia -Patient was requiring D10 LR -IV fluids have been discontinued     Right abdominal wall abscess -History of abdominal hernia -CT A/P showed enlarging 10.7 x 8.6 x 7.5 cm abnormal fluid collection concerning for possible abscess.  Successful bedside I&D by CCS on 10/30.  Mild tenderness around the incision site but no complications -CCS following, appreciate recs -Continue vancomycin, de-escalate based on wound culture and sensitivities -Dressing changes per CCS -Continue IV Protonix for evidence of gastritis on imaging.     Palliative care consulted, and he was transitioned to comfort measures.   RN Pressure Injury Documentation: Pressure Ulcer 12/22/11 reddend, skin break down, minimal drainage present. no odor (Active)  12/22/11 1030  Location: Ankle  Location Orientation: Left  Staging:   Wound Description (Comments): reddend, skin break down, minimal drainage present. no odor  Present on Admission: Yes     Pressure Injury 10/22/22 Buttocks Stage 1 -  Intact skin with non-blanchable redness of a localized area usually over a bony prominence. (  Active)  10/22/22 0030  Location: Buttocks  Location Orientation:   Staging: Stage 1 -  Intact skin with non-blanchable redness of a localized area usually over a bony prominence.  Wound Description (Comments):   Present on Admission: Yes  Dressing Type Foam - Lift dressing to assess site every shift 11/10/22 0800    Malnutrition Type:  Nutrition Problem: Moderate Malnutrition Etiology: social / environmental circumstances (dysphagia, SNF resident,  ABI)   Malnutrition Characteristics:  Signs/Symptoms: moderate fat depletion, moderate muscle depletion   Nutrition Interventions:  Interventions: Refer to RD note for recommendations  Estimated body mass index is 27.25 kg/m as calculated from the following:   Height as of this encounter: 5\' 8"  (1.727 m).   Weight as of this encounter: 81.3 kg.  Code Status: DNR DVT Prophylaxis:  comfort care    Level of Care: Level of care: Progressive Family Communication: none at bedside.   Disposition Plan:     Remains inpatient appropriate:  RESIDENTIAL HOSPICE.   Procedures:  NONE.   Consultants:   Palliative care.   Antimicrobials:   Anti-infectives (From admission, onward)    Start     Dose/Rate Route Frequency Ordered Stop   10/30/22 1100  ceFEPIme (MAXIPIME) 2 g in sodium chloride 0.9 % 100 mL IVPB        2 g 200 mL/hr over 30 Minutes Intravenous Every 8 hours 10/30/22 0954 11/04/22 1813   10/29/22 0000  vancomycin (VANCOREADY) IVPB 750 mg/150 mL        750 mg 150 mL/hr over 60 Minutes Intravenous Every 12 hours 10/28/22 1425 11/02/22 1325   10/27/22 1315  vancomycin (VANCOCIN) IVPB 1000 mg/200 mL premix  Status:  Discontinued        1,000 mg 200 mL/hr over 60 Minutes Intravenous Every 12 hours 10/27/22 1224 10/28/22 1425   10/27/22 1230  vancomycin (VANCOCIN) IVPB 1000 mg/200 mL premix  Status:  Discontinued        1,000 mg 200 mL/hr over 60 Minutes Intravenous Every 12 hours 10/27/22 1222 10/27/22 1223   10/24/22 2200  vancomycin (VANCOCIN) IVPB 1000 mg/200 mL premix  Status:  Discontinued        1,000 mg 200 mL/hr over 60 Minutes Intravenous Every 12 hours 10/24/22 1138 10/27/22 1222   10/24/22 1230  vancomycin (VANCOREADY) IVPB 1250 mg/250 mL        1,250 mg 166.7 mL/hr over 90 Minutes Intravenous  Once 10/24/22 1138 10/24/22 1431   10/23/22 1130  cefTRIAXone (ROCEPHIN) 2 g in sodium chloride 0.9 % 100 mL IVPB        2 g 200 mL/hr over 30 Minutes Intravenous  Every 24 hours 10/23/22 1041 10/26/22 1324   10/22/22 2200  ceFEPIme (MAXIPIME) 2 g in sodium chloride 0.9 % 100 mL IVPB  Status:  Discontinued        2 g 200 mL/hr over 30 Minutes Intravenous Every 8 hours 10/22/22 1559 10/23/22 1041   10/22/22 0900  vancomycin (VANCOREADY) IVPB 750 mg/150 mL  Status:  Discontinued        750 mg 150 mL/hr over 60 Minutes Intravenous Every 12 hours 10/21/22 2041 10/22/22 1559   10/22/22 0700  ceFEPIme (MAXIPIME) 2 g in sodium chloride 0.9 % 100 mL IVPB  Status:  Discontinued        2 g 200 mL/hr over 30 Minutes Intravenous Every 12 hours 10/21/22 2041 10/22/22 1559   10/21/22 1900  vancomycin (VANCOREADY) IVPB 1750 mg/350 mL  1,750 mg 175 mL/hr over 120 Minutes Intravenous  Once 10/21/22 1854 10/21/22 2125   10/21/22 1900  ceFEPIme (MAXIPIME) 2 g in sodium chloride 0.9 % 100 mL IVPB        2 g 200 mL/hr over 30 Minutes Intravenous  Once 10/21/22 1854 10/21/22 1924   10/21/22 1900  azithromycin (ZITHROMAX) 500 mg in sodium chloride 0.9 % 250 mL IVPB  Status:  Discontinued        500 mg 250 mL/hr over 60 Minutes Intravenous Every 24 hours 10/21/22 1854 10/23/22 1041   10/21/22 1845  vancomycin (VANCOCIN) IVPB 1000 mg/200 mL premix  Status:  Discontinued        1,000 mg 200 mL/hr over 60 Minutes Intravenous  Once 10/21/22 1835 10/21/22 1851   10/21/22 1845  levofloxacin (LEVAQUIN) IVPB 750 mg  Status:  Discontinued        750 mg 100 mL/hr over 90 Minutes Intravenous  Once 10/21/22 1835 10/21/22 1849        Medications  Scheduled Meds:  HYDROmorphone HCl  1 mg Oral Q4H   mouth rinse  15 mL Mouth Rinse 4 times per day   Continuous Infusions: PRN Meds:.atropine, haloperidol **OR** haloperidol **OR** [DISCONTINUED] haloperidol lactate, HYDROmorphone HCl, LORazepam **OR** LORazepam **OR** [DISCONTINUED] LORazepam, ondansetron **OR** ondansetron (ZOFRAN) IV    Subjective:   Andersen Iorio was seen and examined today.  Comfortable.    Objective:   Vitals:   11/08/22 2200 11/09/22 0053 11/09/22 1201 11/10/22 0444  BP:   95/64 105/62  Pulse:   72 62  Resp: (!) 22 20 17 13   Temp:   98.6 F (37 C)   TempSrc:   Oral   SpO2:   92% 94%  Weight:      Height:        Intake/Output Summary (Last 24 hours) at 11/10/2022 1601 Last data filed at 11/10/2022 1155 Gross per 24 hour  Intake 536 ml  Output 1200 ml  Net -664 ml   Filed Weights   11/05/22 0436 11/06/22 0422 11/07/22 0452  Weight: 83 kg 81.3 kg 81.3 kg     Exam General: Alert and oriented x 3, NAD Cardiovascular: S1 S2 auscultated, no murmurs, RRR Respiratory: Clear to auscultation bilaterally, no wheezing, rales or rhonchi Gastrointestinal: Soft, nontender, nondistended, + bowel sounds Ext: no pedal edema bilaterally    Data Reviewed:  I have personally reviewed following labs and imaging studies   CBC Lab Results  Component Value Date   WBC 6.4 11/06/2022   RBC 3.62 (L) 11/06/2022   HGB 10.6 (L) 11/06/2022   HCT 32.5 (L) 11/06/2022   MCV 89.8 11/06/2022   MCH 29.3 11/06/2022   PLT 186 11/06/2022   MCHC 32.6 11/06/2022   RDW 16.8 (H) 11/06/2022   LYMPHSABS 1.5 10/23/2022   MONOABS 1.2 (H) 10/23/2022   EOSABS 0.1 10/23/2022   BASOSABS 0.0 10/23/2022     Last metabolic panel Lab Results  Component Value Date   NA 137 11/05/2022   K 3.9 11/05/2022   CL 105 11/05/2022   CO2 27 11/05/2022   BUN 16 11/05/2022   CREATININE 0.87 11/05/2022   GLUCOSE 61 (L) 11/05/2022   GFRNONAA >60 11/05/2022   GFRAA >60 02/23/2020   CALCIUM 8.6 (L) 11/05/2022   PHOS 3.1 10/23/2022   PROT 5.6 (L) 11/05/2022   ALBUMIN 2.5 (L) 11/05/2022   BILITOT 0.3 11/05/2022   ALKPHOS 41 11/05/2022   AST 15 11/05/2022   ALT 9 11/05/2022  ANIONGAP 5 11/05/2022    CBG (last 3)  Recent Labs    11/07/22 1639 11/07/22 1734  GLUCAP 66* 101*      Coagulation Profile: No results for input(s): "INR", "PROTIME" in the last 168 hours.   Radiology  Studies: No results found.     Kathlen Mody M.D. [facility] 11/10/2022, 4:01 PM  Available via Epic secure chat 7am-7pm After 7 pm, please refer to night coverage provider listed on amion.

## 2022-11-10 NOTE — Progress Notes (Signed)
Nutrition Brief Note  Chart reviewed. Pt now transitioning to comfort care.  No further nutrition interventions planned at this time.  Please re-consult as needed.   Allie Bernon Arviso, RDN, LDN Clinical Nutrition  

## 2022-11-11 DIAGNOSIS — Z515 Encounter for palliative care: Secondary | ICD-10-CM

## 2022-11-11 MED ORDER — ORAL CARE MOUTH RINSE
15.0000 mL | OROMUCOSAL | Status: DC
  Administered 2022-11-11 – 2022-11-13 (×8): 15 mL via OROMUCOSAL

## 2022-11-11 MED ORDER — ORAL CARE MOUTH RINSE: 15.0000 mL | OROMUCOSAL | Status: DC | PRN

## 2022-11-11 NOTE — Progress Notes (Addendum)
PROGRESS NOTE    [name]  VZD:[id number] DOB: [birth year] DOA: 10/21/2022 PCP: No primary care provider on file.   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  He was found to have large abscess of right abdominal wall requiring bedside I&D by general surgery.  He was extubated and transferred to [facility] service from 10/29/2022 onwards.  Hospital course complicated by aspiration pneumonia, difficult IV access and low blood pressure.  Palliative care was consulted.  He was subsequently transitioned to full comfort measures.  Assessment & Plan:   Comfort measures only status Septic shock: Present on admission Pneumonia Abdominal wall abscess Hypotension Acute respiratory failure with hypoxemia/respiratory acidosis requiring intubation and extubation History of anoxic brain injury/TBI Seizure disorder encephalopathy/hospital delirium Dysphagia Hypoglycemia Moderate malnutrition Stage I pressure injury over the buttocks: Present on admission   Plan -As discussed above, patient has been transitioned to comfort measures only.  TOC following.  Will possibly need placement at SNF with outpatient hospice.   DVT prophylaxis: None for comfort measures Code Status: DNR Family Communication: None at bedside Disposition Plan: Status is: Inpatient Remains inpatient appropriate because: Of need for placement at SNF with outpatient hospice  Consultants: PCCM/general surgery/palliative care  Procedures: As above  Antimicrobials: Completed antibiotic treatment.  None currently   Subjective: Patient seen and examined at  bedside.  Currently in no distress.  No seizures or agitation reported.  Objective: Vitals:   11/09/22 1201 11/10/22 0444 11/10/22 2005 11/11/22 0743  BP: 95/64 105/62 109/60 125/78  Pulse: 72 62 69 64  Resp: 17 13  16   Temp: 98.6 F (37 C)  98.2 F (36.8 C) 97.6 F (36.4 C)  TempSrc: Oral  Oral Oral  SpO2: 92% 94% 97% 91%  Weight:      Height:        Intake/Output Summary (Last 24 hours) at 11/11/2022 1143 Last data filed at 11/11/2022 0800 Gross per 24 hour  Intake 640 ml  Output 1400 ml  Net -760 ml   Filed Weights   11/05/22 0436 11/06/22 0422 11/07/22 0452  Weight: 83 kg 81.3 kg 81.3 kg    Examination:  General exam: Appears chronically ill and deconditioned.  Currently in no distress.  Currently on room air.    Data Reviewed: I have personally reviewed following labs and imaging studies  CBC: Recent Labs  Lab 11/06/22 0457  WBC 6.4  HGB 10.6*  HCT 32.5*  MCV 89.8  PLT 186   Basic Metabolic Panel: Recent Labs  Lab 11/05/22 0500  NA 137  K 3.9  CL 105  CO2 27  GLUCOSE 61*  BUN 16  CREATININE 0.87  CALCIUM 8.6*   GFR: Estimated Creatinine Clearance: 92.8 mL/min (by C-G formula based on SCr of 0.87 mg/dL). Liver Function Tests: Recent Labs  Lab 11/05/22 0500  AST 15  ALT 9  ALKPHOS 41  BILITOT 0.3  PROT 5.6*  ALBUMIN 2.5*   No results for input(s): "LIPASE", "AMYLASE" in the last 168 hours. No results for input(s): "AMMONIA" in the last 168 hours. Coagulation Profile: No results for input(s): "INR", "PROTIME" in the last 168 hours. Cardiac Enzymes: No results for input(s): "CKTOTAL", "CKMB", "CKMBINDEX", "TROPONINI" in the last  168 hours. BNP (last 3 results) No results for input(s): "PROBNP" in the last 8760 hours. HbA1C: No results for input(s): "HGBA1C" in the last 72 hours. CBG: Recent Labs  Lab 11/07/22 0458 11/07/22 0807 11/07/22 1121 11/07/22 1639 11/07/22 1734  GLUCAP 92 75 84 66* 101*   Lipid Profile: No results  for input(s): "CHOL", "HDL", "LDLCALC", "TRIG", "CHOLHDL", "LDLDIRECT" in the last 72 hours. Thyroid Function Tests: No results for input(s): "TSH", "T4TOTAL", "FREET4", "T3FREE", "THYROIDAB" in the last 72 hours. Anemia Panel: No results for input(s): "VITAMINB12", "FOLATE", "FERRITIN", "TIBC", "IRON", "RETICCTPCT" in the last 72 hours. Sepsis Labs: No results for input(s): "PROCALCITON", "LATICACIDVEN" in the last 168 hours.  No results found for this or any previous visit (from the past 240 hour(s)).       Radiology Studies: No results found.      Scheduled Meds:  HYDROmorphone HCl  1 mg Oral Q4H   mouth rinse  15 mL Mouth Rinse 4 times per day   Continuous Infusions:        Glade Lloyd, MD [facility] 11/11/2022, 11:43 AM

## 2022-11-11 NOTE — TOC Progression Note (Addendum)
Transition of Care [facility]) - Progression Note    Patient Details  Name: [name] MRN: [id number] Date of Birth: 1966/12/01  Transition of Care [facility]) CM/SW Contact  Erin Sons, [place] Phone Number: 11/11/2022, 10:20 AM  Clinical Narrative:     CSW called pt's guardian, Kathie Rhodes, and confirmed plan for [facility](SNF) with hospice care through [facility]. CSW explained that non emergency ambulance greater than 50 miles would have an out of pocket cost. CSW will continue to coordinate with Georges Mouse and daughter regarding DC.   CSW called [place] non emergency ambulance for quote: medicaid and medicare do not cover more than 50 miles so would not be billed. Quote for the transport is $992 needed up front.  Quote from [employer] non emergency ambulance is [phone].   1020: CSW left voicemail with Kathie Rhodes explaining out of pocket cost; requested return call.   1100: CSW called [facility] and confirmed they can admit pt under medicaid with op hospice. She is requesting "SNF with OP hospice" orders to send to [place]. CSW explains that normally this is just noted in DC summary. She explains that  [facility] needs orders 24 hours in advance to start hospice care. Most likely DC tomorrow in order to arrange long distance transport and OP Hospice with [place]. CSW discussed order with MD; no such order exists. Confirmed with SNF that we can send progress note documenting need for OP hospice. CSW faxed MD progress note stating this to [phone]  1430: CSW called pt's guardian again to discuss transport out of pocket costs. Guardian states that she would not be able to pay out of pocket for transport that far. She states she has a friend at [employer] she plans to call and ask if they can help with costs(bill the pt/family to pay later). CSW explained pt is ready for for DC and normally transport at this distance requires to schedule in advance. Guardian explains she plans to call her  friend at [employer] in next 30 minutes or so.   CSW called [facility]emerg[facility] and is informed that they are booked for tomorrow and they are short staffed and likely cannot do Friday either. They state that they are often confused with [employer] [facility] in the same town and provided their phone number. CSW called [employer] [facility] at [phone] and is informed they no longer have an office in the [place] area and would not be able to do transport that far outside of their area.   Called pt's Guardian, she has not spoken with her friend. CSW explains that [facility] stated they closed their [place] area office guardian explains that her friend certainly works there. She stated she is going to make a call and call CSW back.   1515: Hannah called from [facility] and confirmed pt can admit tomorrow. She is inquiring about transport time. CSW explained that pt's guardian is supposed to be discussing with a friend at a transport company as she stated she could not pay the 992$ cost up front for transport. [facility] stated that she is waiting to hear back from pt's guardian as well.   1530: [facility] called back and notified CSW that pt's guardian was unsuccessful in making a transport arrangement with her friend. She is exploring other potential options.   1630: CSW spoke with [facility] who confirmed they could [employer] for pt's transport for [facility] despite being over 80 miles. [facility] can't transport until Friday. Transport  scheduled for 1030am. CSW called [facility] Client at [facility]; she is unsure if she can admit pt Friday as they already have multiple admits scheduled. She is exploring options for transport for tomorrow. CSW explained he has never heard of an ambulance company not requiring payment up front for more than 50 mile transport. She states to keep transport scheduled for Friday, 1030am for now.     Expected Discharge Plan: Skilled Nursing  Facility Barriers to Discharge: Other (must enter comment) (arranging snf bed, arranging long distance ambulance transport)  Expected Discharge Plan and Services Expected Discharge Plan: Skilled Nursing Facility       Living arrangements for the past 2 months: Skilled Nursing Facility                                       Social Determinants of Health (SDOH) Interventions    Readmission Risk Interventions     No data to display

## 2022-11-12 NOTE — Progress Notes (Signed)
PT was sleepy this morning. Was awake this afternoon. When awake PT grinds his teeth, will say "hurt" and will verbalized "ouch" when asked if he is in pain. This evening PT was given scheduled oral pain medication without good results. Given PRN pain medication and Ativan, awake, no teeth grinding noted. May need increase in dosing or frequency. Night shift RN, [place], made aware. PT did not eat today. When offered drink, acted like he was gagging. Mouth care done as ordered.

## 2022-11-12 NOTE — Progress Notes (Signed)
PROGRESS NOTE    [name]  GEX:[id number] DOB: 12-08-1966 DOA: 10/21/2022 PCP: No primary care provider on file.   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  He was found to have large abscess of right abdominal wall requiring bedside I&D by general surgery.  He was extubated and transferred to [facility] service from 10/29/2022 onwards.  Hospital course complicated by aspiration pneumonia, difficult IV access and low blood pressure.  Palliative care was consulted.  He was subsequently transitioned to full comfort measures.  Assessment & Plan:   Comfort measures only status Septic shock: Present on admission Pneumonia Abdominal wall abscess Hypotension Acute respiratory failure with hypoxemia/respiratory acidosis requiring intubation and extubation History of anoxic brain injury/TBI Seizure disorder encephalopathy/hospital delirium Dysphagia Hypoglycemia Moderate malnutrition Stage I pressure injury over the buttocks: Present on admission   Plan -As discussed above, patient has been transitioned to comfort measures only.  TOC following.  Will possibly need placement at SNF with outpatient hospice.   DVT prophylaxis: None for comfort measures Code Status: DNR Family Communication: None at bedside Disposition Plan: Status is: Inpatient Remains inpatient appropriate because: Of need for placement at SNF with outpatient hospice  Consultants: PCCM/general surgery/palliative care  Procedures: As above  Antimicrobials: Completed antibiotic treatment.  None currently   Subjective: Patient seen and examined at  bedside.  Extremely poor historian.  No agitation or seizures reported. Objective: Vitals:   11/10/22 0444 11/10/22 2005 11/11/22 0743 11/11/22 2013  BP: 105/62 109/60 125/78 101/89  Pulse: 62 69 64 75  Resp: 13  16   Temp:  98.2 F (36.8 C) 97.6 F (36.4 C) 98 F (36.7 C)  TempSrc:  Oral Oral Oral  SpO2: 94% 97% 91% 96%  Weight:      Height:        Intake/Output Summary (Last 24 hours) at 11/12/2022 1009 Last data filed at 11/11/2022 2018 Gross per 24 hour  Intake 1100 ml  Output 950 ml  Net 150 ml    Filed Weights   11/05/22 0436 11/06/22 0422 11/07/22 0452  Weight: 83 kg 81.3 kg 81.3 kg    Examination:  General exam: No distress.  On room air currently.  Chronically ill and deconditioned looking.    Data Reviewed: I have personally reviewed following labs and imaging studies  CBC: Recent Labs  Lab 11/06/22 0457  WBC 6.4  HGB 10.6*  HCT 32.5*  MCV 89.8  PLT 186    Basic Metabolic Panel: No results for input(s): "NA", "K", "CL", "CO2", "GLUCOSE", "BUN", "CREATININE", "CALCIUM", "MG", "PHOS" in the last 168 hours.  GFR: Estimated Creatinine Clearance: 92.8 mL/min (by C-G formula based on SCr of 0.87 mg/dL). Liver Function Tests: No results for input(s): "AST", "ALT", "ALKPHOS", "BILITOT", "PROT", "ALBUMIN" in the last 168 hours.  No results for input(s): "LIPASE", "AMYLASE" in the last 168 hours. No results for input(s): "AMMONIA" in the last 168 hours. Coagulation Profile: No results for input(s): "INR", "PROTIME" in the last 168 hours. Cardiac Enzymes: No results for input(s): "CKTOTAL", "CKMB", "CKMBINDEX", "TROPONINI" in the last 168 hours. BNP (last 3 results) No results for input(s): "PROBNP" in the last 8760 hours. HbA1C: No results for input(s): "HGBA1C" in the  last 72 hours. CBG: Recent Labs  Lab 11/07/22 0458 11/07/22 0807 11/07/22 1121 11/07/22 1639 11/07/22 1734  GLUCAP 92 75 84 66* 101*    Lipid Profile: No results for  input(s): "CHOL", "HDL", "LDLCALC", "TRIG", "CHOLHDL", "LDLDIRECT" in the last 72 hours. Thyroid Function Tests: No results for input(s): "TSH", "T4TOTAL", "FREET4", "T3FREE", "THYROIDAB" in the last 72 hours. Anemia Panel: No results for input(s): "VITAMINB12", "FOLATE", "FERRITIN", "TIBC", "IRON", "RETICCTPCT" in the last 72 hours. Sepsis Labs: No results for input(s): "PROCALCITON", "LATICACIDVEN" in the last 168 hours.  No results found for this or any previous visit (from the past 240 hour(s)).       Radiology Studies: No results found.      Scheduled Meds:  HYDROmorphone HCl  1 mg Oral Q4H   mouth rinse  15 mL Mouth Rinse 4 times per day   Continuous Infusions:        Aline August, MD [facility] 11/12/2022, 10:09 AM

## 2022-11-12 NOTE — TOC Progression Note (Addendum)
Transition of Care [facility]) - Progression Note    Patient Details  Name: [name] MRN: [id number] Date of Birth: 10-06-1966  Transition of Care [facility]) CM/SW Contact  Erin Sons, [place] Phone Number: 11/12/2022, 9:48 AM  Clinical Narrative:     Attempted to call [facility] at [facility]; phone busy, will try later.   1020: Called [facility], no answer; left voicemail for return call.  1058: [facility] called CSW back, confirmed they can take pt tomorrow with transport scheduled for 1030am pickup at [facility] by [employer].   CSW called and updated pt's guardian and she is agreeable with this plan.    Expected Discharge Plan: Skilled Nursing Facility Barriers to Discharge: Other (must enter comment) (arranging snf bed, arranging long distance ambulance transport)  Expected Discharge Plan and Services Expected Discharge Plan: Skilled Nursing Facility       Living arrangements for the past 2 months: Skilled Nursing Facility                                       Social Determinants of Health (SDOH) Interventions    Readmission Risk Interventions     No data to display

## 2022-11-13 MED ORDER — HYDROMORPHONE HCL 1 MG/ML PO LIQD: 0.5000 mg | ORAL | 0 refills | Status: AC | PRN

## 2022-11-13 MED ORDER — HYDROMORPHONE HCL 1 MG/ML PO LIQD
1.0000 mg | ORAL | Status: DC
  Administered 2022-11-13: 1 mg via ORAL

## 2022-11-13 MED ORDER — HYDROMORPHONE HCL 1 MG/ML PO LIQD
1.0000 mg | ORAL | Status: DC
  Administered 2022-11-13: 1 mg via ORAL
  Filled 2022-11-13: qty 1

## 2022-11-13 MED ORDER — LORAZEPAM 2 MG/ML PO CONC: 1.0000 mg | ORAL | 0 refills | Status: AC | PRN

## 2022-11-13 MED ORDER — HALOPERIDOL 0.5 MG PO TABS: 0.5000 mg | ORAL_TABLET | ORAL | 0 refills | Status: AC | PRN

## 2022-11-13 NOTE — Discharge Summary (Signed)
Physician Discharge Summary  [name] VKP:[id number] DOB: 11-05-1966 DOA: 10/21/2022  PCP: No primary care provider on file.  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  He was found to have large abscess of right abdominal wall requiring bedside I&D by general surgery.  He was extubated and transferred to [facility] service from 10/29/2022 onwards.  Hospital course complicated by aspiration pneumonia, difficult IV access and low blood pressure.  Palliative care was consulted.  He was subsequently transitioned to full comfort measures.  Discharge to SNF with outpatient hospice once arrangements have been made.  Discharge Diagnoses:   Comfort measures only status Septic shock: Present on admission Pneumonia Abdominal wall abscess Hypotension Acute respiratory failure with hypoxemia/respiratory acidosis requiring intubation and extubation History of anoxic brain injury/TBI Seizure disorder encephalopathy/hospital delirium Dysphagia Hypoglycemia Moderate malnutrition Stage I pressure injury over the buttocks: Present on admission     Plan -As discussed above, patient has been transitioned to comfort measures only.  Discharge to SNF with outpatient  hospice.   Discharge Instructions  Discharge Instructions     No wound care   Complete by: As directed       Allergies as of 11/13/2022       Reactions   Latex Dermatitis, Other (See Comments)   "Allergic," per [facility]   Other Other (See Comments)   Soy Sauce  Reaction not listed on MAR   Penicillins Other (See Comments)   Patient tolerated cephalosporins in past. "Allergic," per MAR.  Has patient had a PCN reaction causing immediate rash, facial/tongue/throat swelling, SOB or lightheadedness with hypotension: unknown Has patient had a PCN reaction causing severe rash involving mucus membranes or skin necrosis: unknown Has patient had a PCN reaction that required hospitalization: no Has patient had a PCN reaction occurring within the last 10 years: yes If all of the above answe   Pyridium [phenazopyridine Hcl] Other (See Comments)   Reaction not listed on MAR   Soy Allergy Other (See Comments)   Reaction not listed on MAR   Soybean (diagnostic) Other (See Comments)   Reaction not listed no MAR        Medication List     STOP taking these medications    acetaminophen 325 MG tablet Commonly known as: TYLENOL   albuterol 0.63 MG/3ML nebulizer solution Commonly known as: ACCUNEB   aspirin 81 MG chewable tablet   calcium carbonate 500 MG chewable tablet Commonly known as: TUMS - dosed in mg elemental calcium   cephALEXin 500 MG capsule Commonly known as: KEFLEX   clonazePAM 0.5 MG tablet Commonly known as: KLONOPIN   divalproex 125 MG capsule Commonly known as: DEPAKOTE SPRINKLE   docusate sodium 100 MG capsule Commonly known as: COLACE   MILK OF MAGNESIA PO   omeprazole 20 MG capsule Commonly known as: PRILOSEC   pantoprazole sodium 40 mg Commonly known as: PROTONIX  polyethylene glycol 17 g packet Commonly known as: MIRALAX / GLYCOLAX   Remeron 15 MG tablet Generic drug: mirtazapine   senna 8.6 MG tablet Commonly known as: SENOKOT    sertraline 50 MG tablet Commonly known as: ZOLOFT   Synthroid 200 MCG tablet Generic drug: levothyroxine   Synthroid 25 MCG tablet Generic drug: levothyroxine       TAKE these medications    haloperidol 0.5 MG tablet Commonly known as: HALDOL Take 1 tablet (0.5 mg total) by mouth every 4 (four) hours as needed for agitation (or delirium). What changed:  medication strength how much to take when to take this reasons to take this   HYDROmorphone HCl 1 MG/ML Liqd Commonly known as: DILAUDID Take 0.5 mLs (0.5 mg total) by mouth every 2 (two) hours as needed for moderate pain or severe pain (dyspnea).   LORazepam 2 MG/ML concentrated solution Commonly known as: ATIVAN Place 0.5 mLs (1 mg total) under the tongue every 4 (four) hours as needed for anxiety.   Refresh Optive Advanced PF 0.5-1-0.5 % Soln Generic drug: Carboxymeth-Glyc-Polysorb PF Place 1 drop into both eyes 3 (three) times daily.        Allergies  Allergen Reactions   Latex Dermatitis and Other (See Comments)    "Allergic," per MAR   Other Other (See Comments)    Soy Sauce  Reaction not listed on MAR   Penicillins Other (See Comments)    Patient tolerated cephalosporins in past. "Allergic," per MAR.  Has patient had a PCN reaction causing immediate rash, facial/tongue/throat swelling, SOB or lightheadedness with hypotension: unknown Has patient had a PCN reaction causing severe rash involving mucus membranes or skin necrosis: unknown Has patient had a PCN reaction that required hospitalization: no Has patient had a PCN reaction occurring within the last 10 years: yes If all of the above answe   Pyridium [Phenazopyridine Hcl] Other (See Comments)    Reaction not listed on MAR   Soy Allergy Other (See Comments)    Reaction not listed on MAR   Soybean (Diagnostic) Other (See Comments)    Reaction not listed no MAR    Consultations: PCCM/general surgery/palliative care    Procedures/Studies: IR  PICC PLACEMENT RIGHT >5 YRS INC IMG GUIDE  Result Date: 11/04/2022 INDICATION: Poor venous access. Need for central venous access for IV therapies. EXAM: ULTRASOUND AND FLUOROSCOPIC GUIDED PICC LINE INSERTION MEDICATIONS: 1% lidocaine 1 mL CONTRAST:  None FLUOROSCOPY: Radiation Exposure Index (as provided by the fluoroscopic device): 1 mGy Kerma COMPLICATIONS: None immediate. TECHNIQUE: The procedure, risks, benefits, and alternatives were explained to the patient and informed written consent was obtained. The right upper extremity was prepped with chlorhexidine in a sterile fashion, and a sterile drape was applied covering the operative field. Maximum barrier sterile technique with sterile gowns and gloves were used for the procedure. A timeout was performed prior to the initiation of the procedure. Local anesthesia was provided with 1% lidocaine. After the overlying soft tissues were anesthetized with 1% lidocaine, a micropuncture kit was utilized to access the right brachial vein. Real-time ultrasound guidance was utilized for vascular access including the acquisition of a permanent ultrasound image documenting patency of the accessed vessel. A guidewire was advanced to the level of the superior caval-atrial junction for measurement purposes and the PICC line was cut to length. A peel-away sheath was placed and a 35 cm, 5 [place], dual lumen was inserted to level of the superior caval-atrial junction. A post procedure spot fluoroscopic was  obtained. The catheter easily aspirated and flushed and was secured in place. A dressing was placed. The patient tolerated the procedure well without immediate post procedural complication. FINDINGS: After catheter placement, the tip lies within the superior cavoatrial junction. The catheter aspirates and flushes normally and is ready for immediate use. IMPRESSION: Successful ultrasound and fluoroscopic guided placement of a right brachial vein approach, 35 cm, 5 French, dual  lumen PICC with tip at the superior caval-atrial junction. The PICC line is ready for immediate use. Procedure performed by: Gareth Eagle, PA-C Electronically Signed   By: Lucrezia Europe M.D.   On: 11/04/2022 16:34   DG Swallowing Func-Speech Pathology  Result Date: 11/04/2022 Table formatting from the original result was not included. Objective Swallowing Evaluation: Type of Study: MBS-Modified Barium Swallow Study  Patient Details Name: [name] MRN: [id number] Date of Birth: Apr 07, 1966 Today's Date: 11/04/2022 Time: SLP Start Time (ACUTE ONLY): 1330 -SLP Stop Time (ACUTE ONLY): 1350 SLP Time Calculation (min) (ACUTE ONLY): 20 min Past Medical History: Past Medical History: Diagnosis Date  Addison disease ([place])   Anoxic brain injury ([place])   TBI  Anxiety   Aspiration pneumonia ([place])   Atrial fibrillation ([place])   Chronic constipation 11/23/2011  Dementia ([place])   Dysphagia   Encephalopathy   Frontal lobe syndrome   Gait abnormality   freq falls  Glaucoma   Glucocorticoid deficiency ([place])   History of heart attack   History of recurrent UTIs   Hyperlipemia   Hypothyroidism   Mental disorder   Myocardial infarction ([place])   Pneumoperitoneum of unknown etiology 12/21/2011  Quadriplegia ([place])   Reflux   Seizure disorder ([facility])   Weakness of both legs  Past Surgical History: Past Surgical History: Procedure Laterality Date  DEBRIDEMENT OF ABDOMINAL WALL ABSCESS  01/26/2018  HERNIA MESH REMOVAL  01/26/2018  Infected mesh - removed w I&D abd wall abscess  IR GENERIC HISTORICAL  10/20/2016  IR [place] GASTRO/COLONIC TUBE PERCUT W/FLUORO 10/20/2016 Arne Cleveland, MD [facility]-INTERV RAD  IR REPLACE G-TUBE SIMPLE WO FLUORO  01/28/2018  IR [place] GASTRO/COLONIC TUBE PERCUT W/FLUORO  04/21/2017  IR [place] GASTRO/COLONIC TUBE PERCUT W/FLUORO  07/22/2017  IR [place] GASTRO/COLONIC TUBE PERCUT W/FLUORO  11/24/2017  IR REPLC GASTRO/COLONIC TUBE PERCUT W/FLUORO  08/12/2020  IRRIGATION AND DEBRIDEMENT ABSCESS N/A 01/26/2018  Procedure: IRRIGATION AND  DEBRIDEMENT ABDOMINAL WALL ABSCESS WITH REMOVAL OF INFECTED MESH;  Surgeon: Michael Boston, MD;  Location: [facility] ORS;  Service: General;  Laterality: N/A;  NEPHRECTOMY  unknown  PEG TUBE PLACEMENT   HPI: 56 year old male with prior history of cardiac arrest with anoxic brain injury who presented with acute hypoxic respiratory failure in the setting of bilateral lower lobe pneumonia requiring endotracheal intubation.  Pt was intubated from 10/25-10/28. MBS 11/1 with recs for dysphagia 1/honey thick liquids. Prior MBS in 2018 recommending Dysphagia 3 solids and honey-thick liquids. Pt with suspected aspiration event possibly related to N/V w/tubefeeds and made NPO.  Subjective: awake, alert, cooperative  Recommendations for follow up therapy are one component of a multi-disciplinary discharge planning process, led by the attending physician.  Recommendations may be updated based on patient status, additional functional criteria and insurance authorization. Assessment / Plan / Recommendation   11/04/2022   2:15 PM Clinical Impressions Clinical Impression Patient continues to present with oral and pharyngeal phase dysphagia which did not appear significantly changed from MBS last week. During pharyngeal phase he quickly transits liquids such that they start to prematurely spill into vallecular. Mastication is delayed as  well as anterior to posterior transit of puree solids and mechanical soft solids but with full clearance from oral cavity after swallow. During pharyngeal phase of swallow, patient's delayed swallow initiation continues to be main factor in his aspiration risk. Swallow initiation was delayed at level of vallecular sinus with puree and mechanical soft solids, however no penetration or aspiration with solids and no significant pharyngeal residuals s/p initial swallow. Patient exhibited swallow initiation delay to primarily vallecular sinus with honey thick liquids but did have instances where head of bolus was  entering the pyriform sinus prior to swallow being initiated. He exhibited swallow initiation delays to level of pyriform sinus with thin and nectar thick liquids. It was during these delays in initiation of swallow that penetration and aspiration events occured. Penetration and aspiration was trace to min, but were all silent with patient not exhibiting any sensation even after aspirate was starting to travel down trachea. Aspiration and penetration events were inconsistent and correlated with the length of swallow initiation delay with penetration and/or aspiration events more likely to occur with longer delays before swallow initiated. No penetration or aspiration events observed with thin liquids via spoon sips, but penetration occured with thin liquids via spoon sips and aspiration occured with cup sips of thin liquids. As patient did not have any penetration or aspiration events with even larger cup sips of honey thick liquids, SLP recommending initiate PO diet of Dys 2 (minced) solids, honey thick liquids. SLP Visit Diagnosis Dysphagia, oropharyngeal phase (R13.12) Impact on safety and function Mild aspiration risk;Moderate aspiration risk     11/04/2022   2:15 PM Treatment Recommendations Treatment Recommendations Therapy as outlined in treatment plan below     11/04/2022   2:29 PM Prognosis Prognosis for Safe Diet Advancement Fair Barriers to Reach Goals Cognitive deficits;Behavior   11/04/2022   2:15 PM Diet Recommendations SLP Diet Recommendations Honey thick liquids;Dysphagia 2 (Fine chop) solids Liquid Administration via Cup;Straw Medication Administration Crushed with puree Compensations Slow rate;Small sips/bites Postural Changes Seated upright at 90 degrees     11/04/2022   2:15 PM Other Recommendations Oral Care Recommendations Oral care BID;Staff/trained caregiver to provide oral care Other Recommendations Clarify dietary restrictions;Order thickener from pharmacy;Prohibited food (jello, ice cream,  thin soups);Remove water pitcher;Have oral suction available Follow Up Recommendations Skilled nursing-short term rehab (<3 hours/day) Assistance recommended at discharge Frequent or constant Supervision/Assistance Functional Status Assessment Patient has had a recent decline in their functional status and demonstrates the ability to make significant improvements in function in a reasonable and predictable amount of time.   11/04/2022   2:15 PM Frequency and Duration  Speech Therapy Frequency (ACUTE ONLY) min 2x/week Treatment Duration 2 weeks     11/04/2022   2:05 PM Oral Phase Oral Phase Impaired Oral - Honey Cup WFL Oral - Nectar Cup WFL Oral - Nectar Straw NT Oral - Puree WFL Oral - Mech Soft Impaired mastication;Reduced posterior propulsion    11/04/2022   2:06 PM Pharyngeal Phase Pharyngeal Phase Impaired Pharyngeal- Honey Cup Delayed swallow initiation-vallecula Pharyngeal- Nectar Teaspoon Delayed swallow initiation-vallecula Pharyngeal- Nectar Cup Delayed swallow initiation-vallecula;Delayed swallow initiation-pyriform sinuses;Penetration/Aspiration during swallow Pharyngeal Material enters airway, passes BELOW cords without attempt by patient to eject out (silent aspiration) Pharyngeal- Nectar Straw NT Pharyngeal- Thin Teaspoon Delayed swallow initiation-vallecula;Delayed swallow initiation-pyriform sinuses Pharyngeal- Thin Cup Penetration/Aspiration during swallow;Reduced airway/laryngeal closure;Delayed swallow initiation-pyriform sinuses;Penetration/Aspiration before swallow Pharyngeal Material enters airway, CONTACTS cords and not ejected out Pharyngeal- Puree Delayed swallow initiation-vallecula Pharyngeal- Mechanical Soft Delayed swallow  initiation-vallecula    11/04/2022   2:15 PM Cervical Esophageal Phase  Cervical Esophageal Phase [facility] Sonia Baller, MA, CCC-SLP Speech Therapy                     DG Abd Portable 1V  Result Date: 11/04/2022 CLINICAL DATA:  Evaluate feeding tube placement EXAM:  PORTABLE ABDOMEN - 1 VIEW COMPARISON:  11/03/2022 FINDINGS: Previously noted nasogastric tube has been removed. There is a feeding tube in place with tip in the distal stomach in the expected location of the antropyloric junction. IMPRESSION: Feeding tube tip is in the distal stomach. Electronically Signed   By: Kerby Moors M.D.   On: 11/04/2022 11:13   DG Abd 1 View  Result Date: 11/03/2022 CLINICAL DATA:  Nasogastric tube placement EXAM: ABDOMEN - 1 VIEW COMPARISON:  10/30/2022 FINDINGS: Nasogastric tube enters the stomach with its tip in the region of the mid body. IMPRESSION: Nasogastric tube tip in the mid body of the stomach. Electronically Signed   By: Nelson Chimes M.D.   On: 11/03/2022 13:09   DG CHEST PORT 1 VIEW  Result Date: 10/30/2022 CLINICAL DATA:  Aspiration EXAM: PORTABLE CHEST 1 VIEW COMPARISON:  10/28/2022 chest radiograph. FINDINGS: Enteric tube enters stomach with the tip noted in the right upper quadrant overlying the region of the pylorus. Stable cardiomediastinal silhouette with normal heart size. No pneumothorax. Small bilateral pleural effusions, right greater than left, similar. Hazy patchy bibasilar lung opacities are similar. No overt pulmonary edema. IMPRESSION: 1. Similar small bilateral pleural effusions, right greater than left. 2. Similar hazy patchy bibasilar lung opacities. Electronically Signed   By: Ilona Sorrel M.D.   On: 10/30/2022 08:52   DG Abd Portable 1V  Result Date: 10/30/2022 CLINICAL DATA:  56 year old male presenting for evaluation of abdominal pain. EXAM: PORTABLE ABDOMEN - 1 VIEW COMPARISON:  October 26, 2022. FINDINGS: Visualized lung bases are clear elevated RIGHT hemidiaphragm. No change in the position of the feeding tube which is either in the distal gastric antrum or pylorus. Barium contrast material administered during swallow study has passed into the colon. Barium transit reaches the rectum. No signs small bowel obstruction. Postoperative  changes in the RIGHT upper quadrant. On limited assessment no acute skeletal process. IMPRESSION: 1. No change in the position of the feeding tube which is either in the distal gastric antrum or pylorus. 2. Barium contrast administered during swallow study has passed into the colon. Barium transit reaches the rectum. Negative for acute findings. Electronically Signed   By: Zetta Bills M.D.   On: 10/30/2022 08:29   [place] EKG SITE RITE  Result Date: 10/30/2022 If Site Rite image not attached, placement could not be confirmed due to current cardiac rhythm.  DG Swallowing Func-Speech Pathology  Result Date: 10/28/2022 Table formatting from the original result was not included. Objective Swallowing Evaluation: Type of Study: MBS-Modified Barium Swallow Study  Patient Details Name: [name] MRN: [id number] Date of Birth: 1966-07-29 Today's Date: 10/28/2022 Time: SLP Start Time (ACUTE ONLY): 1325 -SLP Stop Time (ACUTE ONLY): 1345 SLP Time Calculation (min) (ACUTE ONLY): 20 min Past Medical History: Past Medical History: Diagnosis Date  Addison disease ([place])   Anoxic brain injury ([place])   TBI  Anxiety   Aspiration pneumonia ([place])   Atrial fibrillation ([place])   Chronic constipation 11/23/2011  Dementia ([facility])   Dysphagia   Encephalopathy   Frontal lobe syndrome   Gait abnormality   freq falls  Glaucoma   Glucocorticoid  deficiency ([place])   History of heart attack   History of recurrent UTIs   Hyperlipemia   Hypothyroidism   Mental disorder   Myocardial infarction ([place])   Pneumoperitoneum of unknown etiology 12/21/2011  Quadriplegia ([place])   Reflux   Seizure disorder ([facility])   Weakness of both legs  Past Surgical History: Past Surgical History: Procedure Laterality Date  DEBRIDEMENT OF ABDOMINAL WALL ABSCESS  01/26/2018  HERNIA MESH REMOVAL  01/26/2018  Infected mesh - removed w I&D abd wall abscess  IR GENERIC HISTORICAL  10/20/2016  IR [place] W/FLUORO 10/20/2016 Arne Cleveland, MD [facility]INTER[facility]   IR REPLACE G-TUBE SIMPLE WO FLUORO  01/28/2018  IR [place] GASTRO/COLONIC TUBE PERCUT W/FLUORO  04/21/2017  IR [place] GASTRO/COLONIC TUBE PERCUT W/FLUORO  07/22/2017  IR [place] GASTRO/COLONIC TUBE PERCUT W/FLUORO  11/24/2017  IR [place] GASTRO/COLONIC TUBE PERCUT W/FLUORO  08/12/2020  IRRIGATION AND DEBRIDEMENT ABSCESS N/A 01/26/2018  Procedure: IRRIGATION AND DEBRIDEMENT ABDOMINAL WALL ABSCESS WITH REMOVAL OF INFECTED MESH;  Surgeon: Michael Boston, MD;  Location: [facility];  Service: General;  Laterality: N/A;  NEPHRECTOMY  unknown  PEG TUBE PLACEMENT   HPI: 56 year old male with prior history of cardiac arrest with anoxic brain injury who presented with acute hypoxic respiratory failure in the setting of bilateral lower lobe pneumonia requiring endotracheal intubation.  Pt was intubated from 10/25-10/28.  MBS in 2018 recommending Dysphagia 3 solids and honey-thick liquids.  Subjective: awake, alert, cooperative  Recommendations for follow up therapy are one component of a multi-disciplinary discharge planning process, led by the attending physician.  Recommendations may be updated based on patient status, additional functional criteria and insurance authorization. Assessment / Plan / Recommendation   10/28/2022   2:15 PM Clinical Impressions Clinical Impression Patient presenting with a mild oral dysphagia and a mild-moderate pharyngeal phase dysphagia with impact from cognition leading to him being very impulsive with PO intake. Patient tested with the following barium consistencies, nectar thick, honey thick, puree and soft solid. He exhibited mildly delayed anterior to posterior transit of liquids and solids as well as decreased mastication with soft solids. During pharyngeal phase, patient exhibited swallow initation delay to level of vallecular sinus with all tested bolus consistencies. Only trace amount of vallecular residuals observed s/p initial swallow with liquids and solids. With nectar thick liquids, patient had two  instances of silent aspiration which occured during the swallow, one time being trace amount and the next time a moderate amount. He did not exhibit a cough response until 1-2 minutes after the aspiration event.  These aspiration events occured when patient drinking nectar thick liquid through straw and when tilting his head back taking large cup sip. With small, controlled cup sips, he exhibited only trace penetration. With honey thick liquids, patient did not exhibit any instances of penetration or aspiration and he was able to draw honey thick liquid through straw. SLP tested him on small and large sips and no penetration or aspiration observed. Patient exhibited mastication delays with soft solids and as he is very impulsive with PO intake, SLP recommending to continue with honey thick liquids but to downgrade to Dys 1 (puree) solids. SLP will follow for PO toleration and ability to upgrade. SLP Visit Diagnosis Dysphagia, oropharyngeal phase (R13.12) Impact on safety and function Moderate aspiration risk;Risk for inadequate nutrition/hydration     10/28/2022   2:15 PM Treatment Recommendations Treatment Recommendations Therapy as outlined in treatment plan below     10/28/2022   2:23 PM Prognosis Prognosis  for Safe Diet Advancement Fair Barriers to Reach Goals Cognitive deficits;Behavior   10/28/2022   2:15 PM Diet Recommendations SLP Diet Recommendations Dysphagia 1 (Puree) solids;Honey thick liquids Liquid Administration via Cup;Straw Medication Administration Crushed with puree Compensations Slow rate;Small sips/bites Postural Changes Seated upright at 90 degrees     10/28/2022   2:15 PM Other Recommendations Oral Care Recommendations Oral care BID;Staff/trained caregiver to provide oral care Other Recommendations Clarify dietary restrictions;Order thickener from pharmacy;Prohibited food (jello, ice cream, thin soups);Remove water pitcher;Have oral suction available Follow Up Recommendations Skilled nursing-short  term rehab (<3 hours/day) Assistance recommended at discharge Frequent or constant Supervision/Assistance Functional Status Assessment Patient has had a recent decline in their functional status and demonstrates the ability to make significant improvements in function in a reasonable and predictable amount of time.   10/28/2022   2:15 PM Frequency and Duration  Speech Therapy Frequency (ACUTE ONLY) min 2x/week Treatment Duration 2 weeks     10/28/2022   2:14 PM Oral Phase Oral Phase Impaired Oral - Honey Cup Reduced posterior propulsion Oral - Nectar Cup Reduced posterior propulsion Oral - Nectar Straw Reduced posterior propulsion Oral - Puree Reduced posterior propulsion Oral - Mech Soft Reduced posterior propulsion;Impaired mastication    10/28/2022   2:14 PM Pharyngeal Phase Pharyngeal Phase Impaired Pharyngeal- Honey Cup Delayed swallow initiation-vallecula;Pharyngeal residue - valleculae Pharyngeal- Nectar Cup Delayed swallow initiation-vallecula;Reduced airway/laryngeal closure;Penetration/Aspiration during swallow;Pharyngeal residue - valleculae Pharyngeal Material enters airway, passes BELOW cords without attempt by patient to eject out (silent aspiration) Pharyngeal- Nectar Straw Penetration/Aspiration during swallow;Reduced airway/laryngeal closure;Delayed swallow initiation-vallecula;Pharyngeal residue - valleculae Pharyngeal Material enters airway, passes BELOW cords without attempt by patient to eject out (silent aspiration) Pharyngeal- Puree Delayed swallow initiation-vallecula Pharyngeal- Mechanical Soft Delayed swallow initiation-vallecula    10/28/2022   2:15 PM Cervical Esophageal Phase  Cervical Esophageal Phase [facility] Sonia Baller, MA, CCC-SLP Speech Therapy                     DG CHEST PORT 1 VIEW  Result Date: 10/28/2022 CLINICAL DATA:  Dyspnea, respiratory failure. EXAM: PORTABLE CHEST 1 VIEW COMPARISON:  Chest radiograph 10/22/2022 FINDINGS: The enteric catheter courses below the diaphragm  and off the field of view. The cardiomediastinal silhouette is stable There are new/increased bilateral pleural effusions with left worse than right bibasilar airspace opacities. There is no definite overt pulmonary edema. There is no pneumothorax There is no acute osseous abnormality. IMPRESSION: Overall worsened aeration since 10/22/2022 with new bilateral pleural effusions and left worse than right basilar airspace opacities may reflect developing infection and/or atelectasis. Electronically Signed   By: Valetta Mole M.D.   On: 10/28/2022 08:42   DG Abd Portable 1V  Result Date: 10/26/2022 CLINICAL DATA:  Feeding tube placement EXAM: PORTABLE ABDOMEN - 1 VIEW COMPARISON:  06/02/2021 FINDINGS: Tip of feeding tube is seen in the distal antrum of the stomach. Lower abdomen and pelvis are not included. Bowel gas pattern in the visualized portions of abdomen is unremarkable. There are multiple surgical clips in right mid abdomen. Right hemidiaphragm is elevated. Left diaphragmatic margin is indistinct, possibly suggesting small pleural effusion. IMPRESSION: Tip of feeding tube is seen in the distal antrum of the stomach. Electronically Signed   By: Elmer Picker M.D.   On: 10/26/2022 10:29   CT ABDOMEN PELVIS W CONTRAST  Result Date: 10/24/2022 CLINICAL DATA:  Right lower quadrant abdominal pain. Abdominal hernia. EXAM: CT ABDOMEN AND PELVIS WITH CONTRAST TECHNIQUE: Multidetector CT imaging of  the abdomen and pelvis was performed using the standard protocol following bolus administration of intravenous contrast. RADIATION DOSE REDUCTION: This exam was performed according to the departmental dose-optimization program which includes automated exposure control, adjustment of the mA and/or kV according to patient size and/or use of iterative reconstruction technique. CONTRAST:  51m OMNIPAQUE IOHEXOL 350 MG/ML SOLN COMPARISON:  07/28/2021 FINDINGS: Lower chest: Elevated right hemidiaphragm. New small  bilateral pleural effusions with passive atelectasis. Left anterior descending coronary atherosclerosis and descending thoracic aortic atherosclerosis. Substantial patchy nodular ground-glass and airspace opacities in the right upper lobe with some hazy ground-glass opacity in the left upper lobe, considerable atelectasis in the left lower lobe and right lower lobe, likely some degree of consolidation particularly in the right lower lobe. Patchy ground-glass densities in the lingula. There is some narrowing of the right mainstem bronchus at least partially due to dependent mucus within the airway, and also substantial narrowing of the bronchus intermedius and particularly right lower lobe tracheobronchial tree. Right lower lobe air bronchograms noted with truncation of the right lower lobe bronchus compatible with airway plugging or occlusion. Right greater than left airway thickening noted. There is notable asymmetric edema in the subcutaneous tissues of the right lower chest wall spanning down towards the abdomen. Hepatobiliary: Pancreas: Unremarkable Spleen: Unremarkable Adrenals/Urinary Tract: Adrenal glands normal. Prior right nephrectomy. 2.7 by 1.8 cm hypodense exophytic lesion from the left kidney upper pole with internal density 28 Hounsfield units. A 1.3 cm lesion of the left mid kidney laterally on image 63 series 9 has an internal density of 25 Hounsfield units. Both are homogeneous and considered Bosniak category 2 cysts, and in not require further workup. A Bosniak category 1 cyst of the left kidney upper pole is also present. This does not require further workup. A Foley catheter is present in the urinary bladder. Right retroperitoneal edema as on image 54 series 3, particularly along the site of the prior nephrectomy site. Stomach/Bowel: Diffuse mild gastric wall thickening, cannot exclude gastritis. Right lower rib lateral osteotomies noted with a right lumbar hernia, with laxity of the abdominal  wall allowing herniation of small and large bowel, but no strangulation or complicating feature. This hernia has a wide neck. There are a few sigmoid colon diverticula.  No dilated bowel. Vascular/Lymphatic: Mild abdominal aortic atherosclerosis. No pathologic adenopathy. Reproductive: Unremarkable Other: Diffuse subcutaneous edema noted along the abdomen but especially along the right lateral abdominal wall. Small but abnormal amount of pelvic ascites. Mild diffuse mesenteric edema. Musculoskeletal: Enlarging abnormal fluid collection of the right rectus abdominus musculature no extends substantially into the subcutaneous tissues, measuring about 10.7 by 8.6 by 7.5 cm (volume = 360 cm^3). Surrounding inflammatory stranding in this region, tracking along the abdominal wall musculature cephalad and in the adjacent subcutaneous tissues. Right lumbar hernia with lax or torn lateral abdominal wall musculature and prior right lower rib osteotomies. Old bilateral rib fractures appear healed. Old left transverse process fractures in the lumbar spine at the L2, L3, and L4 levels. 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 There is foraminal impingement at all lumbar levels bilaterally. IMPRESSION: 1. Enlarging abnormal fluid collection of the right rectus abdominus musculature extending substantially into the subcutaneous tissues. This chronic collection could be an abscess or hematoma given the degree of surrounding inflammatory stranding in this region, tracking along the abdominal wall musculature cephalad and in the adjacent subcutaneous tissues. 2. New small bilateral pleural effusions with passive atelectasis. Elevated right hemidiaphragm.  3. Substantial patchy ground-glass and nodular airspace opacities in the right upper lobe with some hazy ground-glass opacity in the left upper lobe, favoring bilateral pneumonia. 4. Right lower lobe air bronchograms with  truncation of the right lower lobe bronchus compatible with airway plugging or occlusion. There is also substantial narrowing of the right bronchus intermedius and right lower lobe tracheobronchial tree. 5. Diffuse gastric wall thickening, cannot exclude gastritis. 6. Diffuse subcutaneous edema along the abdomen but especially along the right lateral abdominal wall. Small but abnormal amount of pelvic ascites. 7. Right lumbar hernia with laxity or tear of the abdominal wall allowing herniation of small and large bowel, but no strangulation or complicating feature. Prior right lower rib osteotomies noted. 8. Prior right nephrectomy. There is some edema in the right retroperitoneum along the site of the prior nephrectomy site. 9. Bosniak category 2 cysts of the left kidney upper pole and mid kidney, no follow up needed. 10. Aortic atherosclerosis. 11. Multilevel lumbar impingement. Aortic Atherosclerosis (ICD10-I70.0). Electronically Signed   By: Van Clines M.D.   On: 10/24/2022 13:18   US Abdomen Limited  Result Date: 10/24/2022 CLINICAL DATA:  Mass in abdomen. EXAM: ULTRASOUND ABDOMEN LIMITED COMPARISON:  07/28/2021. FINDINGS: In the right mid lateral chest, there is an ill-defined hypoechoic heterogeneous avascular mass measuring 6.7 x 3.6 x 6.8 cm. In the second region of concern, there are multiple defects in the anterior abdominal wall superior to the umbilicus with elongated hypoechoic structure possible packing material, not well delineated on exam. IMPRESSION: 1. Complex heterogeneous hypoechoic structure in the right mid lateral chest measuring 6.7 x 3.6 x 6.8 cm, possible hematoma or hernia. CT is suggested for further characterization. 2. Defect in the anterior abdominal wall in the midline above the umbilicus with tubular structure deep to this region and not well delineated on exam, possible postsurgical changes or hernia. CT may be beneficial for further evaluation. Electronically Signed    By: Brett Fairy M.D.   On: 10/24/2022 02:35   ECHOCARDIOGRAM COMPLETE  Result Date: 10/22/2022    ECHOCARDIOGRAM REPORT   Patient Name:   [name] Date of Exam: 10/22/2022 Medical Rec #:  [id number]      Height:       68.0 in Accession #:    [id number]     Weight:       125.0 lb Date of Birth:  1966-01-07     BSA:          1.674 m Patient Age:    53 years       BP:           92/65 mmHg Patient Gender: M              HR:           79 bpm. Exam Location:  Inpatient Procedure: 2D Echo, Cardiac Doppler, Color Doppler and Intracardiac            Opacification Agent Indications:    Cardiomyopathy- Ischemic  History:        Patient has prior history of Echocardiogram examinations, most                 recent 10/05/2017. Arrythmias:Atrial Fibrillation; Risk                 Factors:Dyslipidemia.  Sonographer:    Memory Argue Referring Phys: [id number] RAVI [place]  1. Left ventricular ejection fraction, by estimation, is 50 to 55%. The left ventricle has low normal  function. The left ventricle demonstrates global hypokinesis. Left ventricular diastolic parameters are indeterminate.  2. Right ventricular systolic function is normal. The right ventricular size is mildly enlarged. Tricuspid regurgitation signal is inadequate for assessing PA pressure.  3. The mitral valve is normal in structure. No evidence of mitral valve regurgitation. No evidence of mitral stenosis.  4. The aortic valve is normal in structure. Aortic valve regurgitation is not visualized. No aortic stenosis is present.  5. The inferior vena cava is normal in size with greater than 50% respiratory variability, suggesting right atrial pressure of 3 mmHg. FINDINGS  Left Ventricle: Left ventricular ejection fraction, by estimation, is 50 to 55%. The left ventricle has low normal function. The left ventricle demonstrates global hypokinesis. The left ventricular internal cavity size was normal in size. There is no left ventricular  hypertrophy. Left ventricular diastolic parameters are indeterminate. Right Ventricle: The right ventricular size is mildly enlarged. No increase in right ventricular wall thickness. Right ventricular systolic function is normal. Tricuspid regurgitation signal is inadequate for assessing PA pressure. Left Atrium: Left atrial size was normal in size. Right Atrium: Right atrial size was normal in size. Pericardium: There is no evidence of pericardial effusion. Mitral Valve: The mitral valve is normal in structure. No evidence of mitral valve regurgitation. No evidence of mitral valve stenosis. Tricuspid Valve: The tricuspid valve is normal in structure. Tricuspid valve regurgitation is not demonstrated. No evidence of tricuspid stenosis. Aortic Valve: The aortic valve is normal in structure. Aortic valve regurgitation is not visualized. No aortic stenosis is present. Aortic valve mean gradient measures 3.0 mmHg. Aortic valve peak gradient measures 5.8 mmHg. Aortic valve area, by VTI measures 4.23 cm. Pulmonic Valve: The pulmonic valve was normal in structure. Pulmonic valve regurgitation is not visualized. No evidence of pulmonic stenosis. Aorta: The aortic root is normal in size and structure. Venous: The inferior vena cava is normal in size with greater than 50% respiratory variability, suggesting right atrial pressure of 3 mmHg. IAS/Shunts: No atrial level shunt detected by color flow Doppler.  LEFT VENTRICLE PLAX 2D LVIDd:         4.80 cm   Diastology LVIDs:         3.10 cm   LV e' medial:    8.86 cm/s LV PW:         0.90 cm   LV E/e' medial:  6.8 LV IVS:        0.90 cm   LV e' lateral:   10.70 cm/s LVOT diam:     2.60 cm   LV E/e' lateral: 5.6 LV SV:         93 LV SV Index:   56 LVOT Area:     5.31 cm  RIGHT VENTRICLE RV S prime:     14.10 cm/s LEFT ATRIUM             Index LA Vol (A2C):   36.8 ml 21.99 ml/m LA Vol (A4C):   49.0 ml 29.28 ml/m LA Biplane Vol: 44.7 ml 26.71 ml/m  AORTIC VALVE AV Area (Vmax):     5.18 cm AV Area (Vmean):   4.94 cm AV Area (VTI):     4.23 cm AV Vmax:           120.00 cm/s AV Vmean:          80.800 cm/s AV VTI:            0.221 m AV Peak Grad:  5.8 mmHg AV Mean Grad:      3.0 mmHg LVOT Vmax:         117.00 cm/s LVOT Vmean:        75.200 cm/s LVOT VTI:          0.176 m LVOT/AV VTI ratio: 0.80  AORTA Ao Root diam: 3.60 cm MITRAL VALVE MV Area (PHT): 4.49 cm    SHUNTS MV Decel Time: 169 msec    Systemic VTI:  0.18 m MV E velocity: 60.40 cm/s  Systemic Diam: 2.60 cm MV A velocity: 53.30 cm/s MV E/A ratio:  1.13 Kardie Tobb DO Electronically signed by Berniece Salines DO Signature Date/Time: 10/22/2022/10:28:45 AM    Final    DG Chest Port 1 View  Result Date: 10/22/2022 CLINICAL DATA:  56 year old male respiratory failure.  Intubated. EXAM: PORTABLE CHEST 1 VIEW COMPARISON:  Portable chest 10/21/2022 and earlier. FINDINGS: Portable AP semi upright views at 0505 hours. ET tube advanced, tip now about 1 cm above the carina. Enteric tube terminates in the stomach, looped in the epigastrium. Larger lung volumes. Normal cardiac size and mediastinal contours. Regressed but not completely resolved streaky and indistinct bilateral perihilar opacity. No pneumothorax, pulmonary edema, pleural effusion, consolidation. Negative visible bowel gas.  Stable visualized osseous structures. IMPRESSION: 1. Satisfactory ETT and enteric tube. 2. Larger lung volumes and improved ventilation. Mild residual perihilar opacity which is nonspecific and might be atelectasis. Electronically Signed   By: Genevie Ann M.D.   On: 10/22/2022 09:17   DG Chest Portable 1 View  Result Date: 10/21/2022 CLINICAL DATA:  intubation EXAM: PORTABLE CHEST 1 VIEW COMPARISON:  Chest x-ray 05/08/2022 FINDINGS: Endotracheal tube 7 cm above the carina. Enteric tube coursing below the hemidiaphragm with side port overlying the expected region of the gastric lumen and tip collimated off view. The heart and mediastinal contours are within  normal limits. Diffuse patchy airspace and interstitial opacities. No pulmonary edema. No pleural effusion. No pneumothorax. No acute osseous abnormality. IMPRESSION: 1. Endotracheal tube 7 cm above the carina. Recommend advancing by 2 cm. 2. Enteric tube in grossly appropriate position. 3. Diffuse patchy airspace and interstitial opacities. Followup PA and lateral chest X-ray is recommended in 3-4 weeks following therapy to ensure resolution and exclude underlying malignancy. Electronically Signed   By: Iven Finn M.D.   On: 10/21/2022 19:08      Subjective: Patient seen and examined at bedside.  Extremely poor historian.  No seizures or agitation reported.  Discharge Exam: Vitals:   11/12/22 1100 11/13/22 0728  BP: 106/75 113/68  Pulse: 82 76  Resp:  18  Temp: 97.6 F (36.4 C) 97.7 F (36.5 C)  SpO2: 95% 95%    General: No distress.  On room air currently.  Chronically ill and deconditioned looking.     The results of significant diagnostics from this hospitalization (including imaging, microbiology, ancillary and laboratory) are listed below for reference.     Microbiology: No results found for this or any previous visit (from the past 240 hour(s)).   Labs: BNP (last 3 results) Recent Labs    10/21/22 1833  BNP 48.8   Basic Metabolic Panel: No results for input(s): "NA", "K", "CL", "CO2", "GLUCOSE", "BUN", "CREATININE", "CALCIUM", "MG", "PHOS" in the last 168 hours. Liver Function Tests: No results for input(s): "AST", "ALT", "ALKPHOS", "BILITOT", "PROT", "ALBUMIN" in the last 168 hours. No results for input(s): "LIPASE", "AMYLASE" in the last 168 hours. No results for input(s): "AMMONIA" in the last 168 hours.  CBC: No results for input(s): "WBC", "NEUTROABS", "HGB", "HCT", "MCV", "PLT" in the last 168 hours. Cardiac Enzymes: No results for input(s): "CKTOTAL", "CKMB", "CKMBINDEX", "TROPONINI" in the last 168 hours. BNP: Invalid input(s): "POCBNP" CBG: Recent  Labs  Lab 11/07/22 0458 11/07/22 0807 11/07/22 1121 11/07/22 1639 11/07/22 1734  GLUCAP 92 75 84 66* 101*   D-Dimer No results for input(s): "DDIMER" in the last 72 hours. Hgb A1c No results for input(s): "HGBA1C" in the last 72 hours. Lipid Profile No results for input(s): "CHOL", "HDL", "LDLCALC", "TRIG", "CHOLHDL", "LDLDIRECT" in the last 72 hours. Thyroid function studies No results for input(s): "TSH", "T4TOTAL", "T3FREE", "THYROIDAB" in the last 72 hours.  Invalid input(s): "FREET3" Anemia work up No results for input(s): "VITAMINB12", "FOLATE", "FERRITIN", "TIBC", "IRON", "RETICCTPCT" in the last 72 hours. Urinalysis    Component Value Date/Time   COLORURINE YELLOW 10/22/2022 0039   APPEARANCEUR HAZY (A) 10/22/2022 0039   LABSPEC 1.032 (H) 10/22/2022 0039   PHURINE 5.0 10/22/2022 0039   GLUCOSEU NEGATIVE 10/22/2022 0039   HGBUR NEGATIVE 10/22/2022 0039   BILIRUBINUR NEGATIVE 10/22/2022 0039   KETONESUR NEGATIVE 10/22/2022 0039   PROTEINUR 30 (A) 10/22/2022 0039   UROBILINOGEN 0.2 01/30/2015 1059   NITRITE NEGATIVE 10/22/2022 0039   LEUKOCYTESUR TRACE (A) 10/22/2022 0039   Sepsis Labs No results for input(s): "WBC" in the last 168 hours.  Invalid input(s): "PROCALCITONIN", "LACTICIDVEN" Microbiology No results found for this or any previous visit (from the past 240 hour(s)).   Time coordinating discharge: 35 minutes  SIGNED:   Aline August, MD  [facility] 11/13/2022, 8:23 AM

## 2022-11-13 NOTE — Progress Notes (Signed)
Attempted to call [facility] 2x to give report, no response at this time. Will attempt to call again in 30 mins.

## 2022-11-13 NOTE — TOC Transition Note (Addendum)
Transition of Care [facility]) - CM/SW Discharge Note   Patient Details  Name: [name] MRN: [id number] Date of Birth: July 26, 1966  [facility] CM/SW Contact:  Erin Sons, LCSW Phone Number: 11/13/2022, 9:53 AM   Clinical Narrative:     Patient will DC to: [facility] Anticipated DC date: 11/13/22 Family notified: Psychologist, forensic (Legal Guardian) [phone] (Mobile)  Transport by: [facility]   Per MD patient ready for DC to [facility]. RN, patient, patient's family, and facility notified of DC. Discharge Summary and FL2 sent to facility. RN to call report prior to discharge [phone]). DC packet on chart. Ambulance transport requested for patient.   CSW will sign off for now as social work intervention is no longer needed. Please consult [place] again if new needs arise.    Final next level of care: Skilled Nursing Facility Barriers to Discharge: No Barriers Identified   Patient Goals and CMS Choice     Choice offered to / list presented to : Sibling (sister is legal guardian)  Discharge Placement              Patient chooses bed at:  [facility]) Patient to be transferred to facility by: [facility] Name of family member notified: Shiela Mayer Doctor, hospital Guardian)  [phone] (Mobile) Patient and family notified of of transfer: 11/13/22  Discharge Plan and Services                                     Social Determinants of Health (SDOH) Interventions     Readmission Risk Interventions     No data to display

## 2022-11-13 NOTE — Progress Notes (Signed)
PICC line removed per order.  Pressure applied to achieve hemostasis.  Vaseline/gauze/tegaderm applied and aftercare instructions reviewed with nurse tech who is at bedside.

## 2023-06-28 DEATH — deceased
